# Patient Record
Sex: Male | Born: 1935 | Race: Black or African American | Hispanic: No | Smoking: Former smoker
Health system: Southern US, Community
[De-identification: ages and names within clinical notes are randomized; demographics above are authoritative.]

## PROBLEM LIST (undated history)

## (undated) DIAGNOSIS — R55 Syncope and collapse: Secondary | ICD-10-CM

## (undated) DIAGNOSIS — IMO0002 Reserved for concepts with insufficient information to code with codable children: Secondary | ICD-10-CM

## (undated) DIAGNOSIS — M1990 Unspecified osteoarthritis, unspecified site: Secondary | ICD-10-CM

## (undated) DIAGNOSIS — K219 Gastro-esophageal reflux disease without esophagitis: Secondary | ICD-10-CM

## (undated) DIAGNOSIS — Z8551 Personal history of malignant neoplasm of bladder: Secondary | ICD-10-CM

## (undated) DIAGNOSIS — I4729 Other ventricular tachycardia: Secondary | ICD-10-CM

## (undated) DIAGNOSIS — I493 Ventricular premature depolarization: Secondary | ICD-10-CM

## (undated) DIAGNOSIS — E785 Hyperlipidemia, unspecified: Secondary | ICD-10-CM

## (undated) DIAGNOSIS — I4891 Unspecified atrial fibrillation: Secondary | ICD-10-CM

## (undated) DIAGNOSIS — H5462 Unqualified visual loss, left eye, normal vision right eye: Secondary | ICD-10-CM

## (undated) DIAGNOSIS — F329 Major depressive disorder, single episode, unspecified: Secondary | ICD-10-CM

## (undated) DIAGNOSIS — I472 Ventricular tachycardia: Secondary | ICD-10-CM

## (undated) DIAGNOSIS — J449 Chronic obstructive pulmonary disease, unspecified: Secondary | ICD-10-CM

## (undated) DIAGNOSIS — N401 Enlarged prostate with lower urinary tract symptoms: Secondary | ICD-10-CM

## (undated) DIAGNOSIS — I2510 Atherosclerotic heart disease of native coronary artery without angina pectoris: Secondary | ICD-10-CM

## (undated) DIAGNOSIS — R0600 Dyspnea, unspecified: Secondary | ICD-10-CM

## (undated) DIAGNOSIS — I739 Peripheral vascular disease, unspecified: Secondary | ICD-10-CM

## (undated) DIAGNOSIS — R001 Bradycardia, unspecified: Secondary | ICD-10-CM

## (undated) DIAGNOSIS — Z86718 Personal history of other venous thrombosis and embolism: Secondary | ICD-10-CM

## (undated) DIAGNOSIS — H409 Unspecified glaucoma: Secondary | ICD-10-CM

## (undated) DIAGNOSIS — G8929 Other chronic pain: Secondary | ICD-10-CM

## (undated) DIAGNOSIS — F32A Depression, unspecified: Secondary | ICD-10-CM

## (undated) DIAGNOSIS — M545 Low back pain, unspecified: Secondary | ICD-10-CM

## (undated) DIAGNOSIS — F1010 Alcohol abuse, uncomplicated: Secondary | ICD-10-CM

## (undated) DIAGNOSIS — I5042 Chronic combined systolic (congestive) and diastolic (congestive) heart failure: Secondary | ICD-10-CM

## (undated) DIAGNOSIS — I10 Essential (primary) hypertension: Secondary | ICD-10-CM

## (undated) DIAGNOSIS — Z86711 Personal history of pulmonary embolism: Secondary | ICD-10-CM

## (undated) DIAGNOSIS — Z973 Presence of spectacles and contact lenses: Secondary | ICD-10-CM

## (undated) DIAGNOSIS — I447 Left bundle-branch block, unspecified: Secondary | ICD-10-CM

## (undated) DIAGNOSIS — I428 Other cardiomyopathies: Secondary | ICD-10-CM

## (undated) DIAGNOSIS — I724 Aneurysm of artery of lower extremity: Secondary | ICD-10-CM

## (undated) HISTORY — PX: SHOULDER SURGERY: SHX246

## (undated) HISTORY — DX: Hyperlipidemia, unspecified: E78.5

## (undated) HISTORY — PX: TONSILLECTOMY: SUR1361

## (undated) HISTORY — DX: Ventricular premature depolarization: I49.3

## (undated) HISTORY — DX: Peripheral vascular disease, unspecified: I73.9

## (undated) HISTORY — PX: CATARACT EXTRACTION W/ INTRAOCULAR LENS  IMPLANT, BILATERAL: SHX1307

## (undated) HISTORY — DX: Atherosclerotic heart disease of native coronary artery without angina pectoris: I25.10

## (undated) HISTORY — PX: TRANSTHORACIC ECHOCARDIOGRAM: SHX275

## (undated) HISTORY — PX: CARDIAC CATHETERIZATION: SHX172

## (undated) HISTORY — DX: Essential (primary) hypertension: I10

---

## 1998-12-11 ENCOUNTER — Encounter: Payer: Self-pay | Admitting: Specialist

## 1998-12-17 ENCOUNTER — Encounter: Payer: Self-pay | Admitting: Specialist

## 1998-12-17 ENCOUNTER — Inpatient Hospital Stay (HOSPITAL_COMMUNITY): Admission: RE | Admit: 1998-12-17 | Discharge: 1998-12-18 | Payer: Self-pay | Admitting: Specialist

## 1999-01-20 ENCOUNTER — Encounter: Admission: RE | Admit: 1999-01-20 | Discharge: 1999-02-12 | Payer: Self-pay | Admitting: Specialist

## 2004-05-05 ENCOUNTER — Ambulatory Visit: Payer: Self-pay | Admitting: Cardiology

## 2004-05-11 ENCOUNTER — Inpatient Hospital Stay (HOSPITAL_BASED_OUTPATIENT_CLINIC_OR_DEPARTMENT_OTHER): Admission: RE | Admit: 2004-05-11 | Discharge: 2004-05-11 | Payer: Self-pay | Admitting: Cardiology

## 2004-05-11 ENCOUNTER — Ambulatory Visit: Payer: Self-pay | Admitting: Cardiology

## 2004-05-22 ENCOUNTER — Ambulatory Visit: Payer: Self-pay

## 2004-11-06 ENCOUNTER — Ambulatory Visit: Payer: Self-pay | Admitting: Cardiology

## 2005-03-26 ENCOUNTER — Ambulatory Visit (HOSPITAL_COMMUNITY): Admission: RE | Admit: 2005-03-26 | Discharge: 2005-03-26 | Payer: Self-pay | Admitting: *Deleted

## 2005-10-06 ENCOUNTER — Encounter: Admission: RE | Admit: 2005-10-06 | Discharge: 2005-11-03 | Payer: Self-pay | Admitting: Family Medicine

## 2007-05-19 ENCOUNTER — Encounter: Admission: RE | Admit: 2007-05-19 | Discharge: 2007-05-19 | Payer: Self-pay | Admitting: Family Medicine

## 2007-05-25 ENCOUNTER — Ambulatory Visit (HOSPITAL_COMMUNITY): Admission: RE | Admit: 2007-05-25 | Discharge: 2007-05-25 | Payer: Self-pay | Admitting: Family Medicine

## 2007-06-02 ENCOUNTER — Encounter (INDEPENDENT_AMBULATORY_CARE_PROVIDER_SITE_OTHER): Payer: Self-pay | Admitting: Radiology

## 2007-06-02 ENCOUNTER — Ambulatory Visit (HOSPITAL_COMMUNITY): Admission: RE | Admit: 2007-06-02 | Discharge: 2007-06-02 | Payer: Self-pay | Admitting: Family Medicine

## 2007-07-11 ENCOUNTER — Ambulatory Visit: Payer: Self-pay | Admitting: Surgery

## 2007-07-12 ENCOUNTER — Ambulatory Visit: Payer: Self-pay | Admitting: Vascular Surgery

## 2010-02-02 ENCOUNTER — Encounter
Admission: RE | Admit: 2010-02-02 | Discharge: 2010-04-09 | Payer: Self-pay | Source: Home / Self Care | Attending: Family Medicine | Admitting: Family Medicine

## 2010-08-18 ENCOUNTER — Ambulatory Visit: Payer: Medicare Other | Attending: Orthopedic Surgery | Admitting: Physical Therapy

## 2010-08-18 DIAGNOSIS — M545 Low back pain, unspecified: Secondary | ICD-10-CM | POA: Insufficient documentation

## 2010-08-18 DIAGNOSIS — IMO0001 Reserved for inherently not codable concepts without codable children: Secondary | ICD-10-CM | POA: Insufficient documentation

## 2010-08-18 DIAGNOSIS — M6281 Muscle weakness (generalized): Secondary | ICD-10-CM | POA: Insufficient documentation

## 2010-08-21 ENCOUNTER — Encounter: Payer: BC Managed Care – PPO | Admitting: Physical Therapy

## 2010-08-25 NOTE — Consult Note (Signed)
NEW PATIENT CONSULTATION   Scott [name]  DOB:  1921-07-31                                       07/12/2007  EAVWU#:[id number]   The patient presents today for evaluation of left leg swelling and  absent pulses.  He is a healthy 77 year old black male who reports  several symptoms in his lower extremities.  He had noted some mild  swelling in his left foot and calf.  This is intermittent.  He does not  have any pain associated.  He does have numbness in his left great toe  and has been told by a podiatrist, this is related to bone deformity and  bunion.  He, on further questioning, does have calf claudication.  He  does not do a great deal of walking, but does report if he pushes  himself, he will have some tiredness and a feeling of giving out in his  calves, and have to rest for a few minutes.  This is not limiting to  him.  He denies any typical arterial rest pain and has not had any  tissue loss.  He does not have any history of deep vein thrombosis.   PAST MEDICAL HISTORY:  Negative for any cardiac disease.  He does not  have any diabetes.  He has hypertension.   SOCIAL HISTORY:  He does not smoke, having quit 35 years ago.   FAMILY HISTORY:  Negative for premature atherosclerotic disease.  He is  married with 1 child.  He is retired.  He does not drink alcohol.   REVIEW OF SYSTEMS:  Negative.   ALLERGIES:  None.   MEDICATIONS:  Niaspan.  Ditropan.  Potassium supplement.  Toprol.  Benicar.  TriCor.  Zocor.  Flomax.  Aspirin.  Viagra.   PHYSICAL EXAM:  The patient is a well-developed black male appearing  stated age of 45.  His blood pressure is 145/76, heart rate is 66,  respirations 18.  His carotid arteries have bruits bilaterally.  He is  grossly intact neurologically.  His radial and femoral pulses are 2+  bilaterally.  I do not palpate popliteal or distal pulses.  His heart is  regular rate and rhythm.  His chest is clear bilaterally.  His feet  show  no evidence of tissue loss and are well-perfused.  He underwent  noninvasive vascular laboratory studies in our office on 07/11/2007.  I  reviewed these with the patient and his wife.  His ankle/arm index is  0.75 on the right and 0.78 on the left with monophasic tibial wave  forms.  I explained that he does have evidence of superficial femoral or  popliteal occlusion.  He is mildly symptomatic from this.  I explained  this would not have anything to do with his leg swelling, and have not  recommended any further evaluation or treatment due to this.  I  explained that his foot numbness would not be related to arterial or  venous pathology and would not suspect any serious implications from  this.  He was reassured with this discussion and will see [place] again on an  as needed basis.   Larina Earthly, M.D.  Electronically Signed   TFE/MEDQ  D:  07/12/2007  T:  07/13/2007  Job:  [id number]   cc:   Ernestina Penna, M.D.

## 2010-08-25 NOTE — Procedures (Signed)
LOWER EXTREMITY ARTERIAL EVALUATION-SINGLE LEVEL   INDICATION:  Left toe numbness, which is intermittent.  Left calf edema.   HISTORY:  Diabetes:  No.  Cardiac:  No.  Hypertension:  Yes.  Smoking:  No, quit 35 years ago.  Previous Surgery:   RESTING SYSTOLIC PRESSURES: (ABI)                          RIGHT                LEFT  Brachial:               160                  152  Anterior tibial:        80                   100  Posterior tibial:       120 (0.75)           124 (0.78)  Peroneal:  DOPPLER WAVEFORM ANALYSIS:  Anterior tibial:        Monophasic           Monophasic  Posterior tibial:       Monophasic           Monophasic  Peroneal:  PREVIOUS ABI'S:  Date:  RIGHT:  LEFT:   IMPRESSION:  1. 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  2. Thrombosed left popliteal artery.  Patient is asymptomatic at this      time, and Dr. Edilia Bo felt it was all right for patient to return      tomorrow to see Dr. Fabienne Bruns.   ___________________________________________  Scott Hora Darrick Penna, MD   DP/MEDQ  D:  07/11/2007  T:  07/11/2007  Job:  [id number]

## 2010-08-26 ENCOUNTER — Ambulatory Visit: Payer: Medicare Other | Admitting: Physical Therapy

## 2010-08-28 ENCOUNTER — Ambulatory Visit: Payer: Medicare Other | Admitting: *Deleted

## 2010-08-28 NOTE — Cardiovascular Report (Signed)
NAMEROCKFORD, LEINEN NO.:  [id number]   MEDICAL RECORD NO.:  [id number]          PATIENT TYPE:  OIB   LOCATION:  6501                         FACILITY:  [facility]   PHYSICIAN:  Rollene Rotunda, M.D.   DATE OF BIRTH:  09-20-1921   DATE OF PROCEDURE:  05/11/2004  DATE OF DISCHARGE:                              CARDIAC CATHETERIZATION   PRIMARY CARE PHYSICIAN:  Ernestina Penna, M.D.   REASON FOR ADMISSION:  Evaluate patient with chest pain and a Cardiolite  suggesting possible inferior ischemia.  Also had an abnormal ejection  fraction of 52%.   PROCEDURE:  Left heart catheterization is performed via the right femoral  artery.  The artery was cannulated using anterior wall puncture.  A #4  French arterial sheath was inserted via the modified Seldinger technique.  Preformed Judkins and a pigtail catheter were utilized.  The patient  tolerated the procedure well and left the lab in stable condition.   RESULTS:  1.  Hemodynamics:  LV 139/8, AO 138/89.  2.  Coronaries:  The left main was normal.  The LAD had mid 25% stenosis.      The first diagonal was large and normal.  The second diagonal was      moderate size and normal.  The circumflex in the AV groove was normal.      There was mid obtuse marginal which was large and normal.  The right      coronary artery was a large dominant vessel and normal.  The PDA was      moderate size and normal.  3.  Left ventriculogram:  The left ventriculogram was obtained in the RAO      projection.  The EF was 55% with well-preserved ejection fraction.   CONCLUSION:  Minimal coronary plaque.  Normal left ventricular function.   PLAN:  No further cardiac workup is suggested.  The patient will continue to  follow with Dr. Christell Constant for primary risk reduction.      JH/MEDQ  D:  05/11/2004  T:  05/11/2004  Job:  [id number]   cc:   Ernestina Penna, M.D.  [address] [place]  [place] [address]  Fax: [phone]

## 2010-08-31 ENCOUNTER — Ambulatory Visit: Payer: Medicare Other | Admitting: Physical Therapy

## 2010-09-04 ENCOUNTER — Ambulatory Visit: Payer: Medicare Other | Admitting: *Deleted

## 2010-09-08 ENCOUNTER — Encounter: Payer: Self-pay | Admitting: Cardiology

## 2010-09-09 ENCOUNTER — Encounter: Payer: BC Managed Care – PPO | Admitting: Physical Therapy

## 2010-09-09 ENCOUNTER — Encounter: Payer: Self-pay | Admitting: Cardiology

## 2010-09-09 ENCOUNTER — Ambulatory Visit (INDEPENDENT_AMBULATORY_CARE_PROVIDER_SITE_OTHER): Payer: Medicare Other | Admitting: Cardiology

## 2010-09-09 DIAGNOSIS — E785 Hyperlipidemia, unspecified: Secondary | ICD-10-CM | POA: Insufficient documentation

## 2010-09-09 DIAGNOSIS — I493 Ventricular premature depolarization: Secondary | ICD-10-CM

## 2010-09-09 DIAGNOSIS — I10 Essential (primary) hypertension: Secondary | ICD-10-CM | POA: Insufficient documentation

## 2010-09-09 DIAGNOSIS — I4949 Other premature depolarization: Secondary | ICD-10-CM

## 2010-09-09 DIAGNOSIS — I2510 Atherosclerotic heart disease of native coronary artery without angina pectoris: Secondary | ICD-10-CM

## 2010-09-09 NOTE — Assessment & Plan Note (Signed)
He has frequent PVCs but he does not recall any symptoms.  I will schedule an echo and 24 hour Holter to further evaluate.

## 2010-09-09 NOTE — Patient Instructions (Signed)
You will be called with an appointment for a 2 D Echo and a 24 hour heart monitor.  There are no pre-procedure instructions for this testing. Please continue your current medications Follow up as needed

## 2010-09-09 NOTE — Assessment & Plan Note (Signed)
I reviewed his last lipids and they were very good for primary prevention.  No change in therapy is indicated.

## 2010-09-09 NOTE — Progress Notes (Signed)
HPI The patient presents for evaluation of frequent ventricular ectopy. He was noted recently on EKG to have very frequent ectopy with trigeminy. He actually does not feel this though there is apparently some confusion. However, he denies limitations and he denies any presyncope or syncope. I do note that he has had a fall. He does not report any chest pressure, neck or arm discomfort. He does not report any shortness of breath, PND or orthopnea. He does have some chronic lower extremity swelling on the left side and he has been evaluated for this.  I reviewed previous records and see if he had frequent PVCs at the time of hospitalization at [facility] in 2006.  I do not see a report of the echo that was ordered at that time.  He has had a cath around that time that apparently demonstrated only mild CAD.  No Known Allergies  Current Outpatient Prescriptions  Medication Sig Dispense Refill  . amLODipine (NORVASC) 5 MG tablet Take 5 mg by mouth daily.        . fenofibrate 160 MG tablet Take 160 mg by mouth daily.        . metoprolol (TOPROL-XL) 100 MG 24 hr tablet Take 100 mg by mouth. 1/2 po daily       . niacin (NIASPAN) 1000 MG CR tablet Take 1,000 mg by mouth at bedtime.        Marland Kitchen olmesartan-hydrochlorothiazide (BENICAR HCT) 20-12.5 MG per tablet Take 1 tablet by mouth daily.        Marland Kitchen oxybutynin (DITROPAN) 5 MG tablet Take 5 mg by mouth daily.        . simvastatin (ZOCOR) 10 MG tablet Take 10 mg by mouth at bedtime.        . Tamsulosin HCl (FLOMAX) 0.4 MG CAPS Take 0.4 mg by mouth daily.          Past Medical History  Diagnosis Date  . HTN (hypertension)   . Hyperlipidemia   . PVCs (premature ventricular contractions)   . Glaucoma   . CAD (coronary artery disease)     Non obstructive 25% LAD 2006  . CKD (chronic kidney disease)   . Sleep apnea     Doesn't use CPAP  . PVD (peripheral vascular disease)     Right ABI .75, Left .78 (2006)    Past Surgical History  Procedure Date  .  None     Family History  Problem Relation Age of Onset  . Hypertension Mother     History   Social History  . Marital Status: Married    Spouse Name: N/A    Number of Children: 1  . Years of Education: N/A   Occupational History  . Retired    Social History Main Topics  . Smoking status: Former Smoker    Quit date: 04/13/1975  . Smokeless tobacco: Not on file  . Alcohol Use: No  . Drug Use: Not on file  . Sexually Active: Not on file   Other Topics Concern  . Not on file   Social History Narrative  . No narrative on file    ROS:  As stated in the HPI and negative for all other systems.   PHYSICAL EXAM BP 112/68  Pulse 61  Resp 16  Ht 5\' 8"  (1.727 m)  Wt 165 lb (74.844 kg)  BMI 25.09 kg/m2 GENERAL:  Well appearing HEENT:  Pupils equal round and reactive, fundi not visualized, oral mucosa unremarkable, poor dentition NECK:  No  jugular venous distention, waveform within normal limits, carotid upstroke brisk and symmetric, no bruits, no thyromegaly LYMPHATICS:  No cervical, inguinal adenopathy LUNGS:  Clear to auscultation bilaterally BACK:  No CVA tenderness CHEST:  Unremarkable HEART:  PMI not displaced or sustained,S1 and S2 within normal limits, no S3, no S4, no clicks, no rubs, no murmurs ABD:  Flat, positive bowel sounds normal in frequency in pitch, no bruits, no rebound, no guarding, no midline pulsatile mass, no hepatomegaly, no splenomegaly EXT:  2 plus pulses upper and femorals.  Decreased DP/PT bilateral, left leg edema, no cyanosis no clubbing SKIN:  No rashes no nodules NEURO:  Cranial nerves II through XII grossly intact, motor grossly intact throughout PSYCH:  Cognitively intact, oriented to person place and time  EKG:  Sinus tachycardia, rate 114, interventricular conduction delay, left axis deviation, frequent and consecutive premature ventricular contractions (08/31/10)  ASSESSMENT AND PLAN

## 2010-09-09 NOTE — Assessment & Plan Note (Signed)
The blood pressure is at target. No change in medications is indicated. We will continue with therapeutic lifestyle changes (TLC).  

## 2010-09-11 ENCOUNTER — Encounter: Payer: Medicare Other | Admitting: Physical Therapy

## 2010-09-15 ENCOUNTER — Ambulatory Visit: Payer: Medicare Other | Attending: Orthopedic Surgery | Admitting: *Deleted

## 2010-09-15 DIAGNOSIS — M545 Low back pain, unspecified: Secondary | ICD-10-CM | POA: Insufficient documentation

## 2010-09-15 DIAGNOSIS — M6281 Muscle weakness (generalized): Secondary | ICD-10-CM | POA: Insufficient documentation

## 2010-09-15 DIAGNOSIS — IMO0001 Reserved for inherently not codable concepts without codable children: Secondary | ICD-10-CM | POA: Insufficient documentation

## 2010-09-17 ENCOUNTER — Ambulatory Visit: Payer: Medicare Other | Admitting: Physical Therapy

## 2010-09-17 ENCOUNTER — Encounter: Payer: Self-pay | Admitting: Cardiology

## 2010-09-21 ENCOUNTER — Ambulatory Visit: Payer: Medicare Other

## 2010-09-22 ENCOUNTER — Ambulatory Visit (HOSPITAL_COMMUNITY): Payer: Medicare Other | Attending: Cardiology

## 2010-09-22 ENCOUNTER — Encounter (INDEPENDENT_AMBULATORY_CARE_PROVIDER_SITE_OTHER): Payer: Medicare Other

## 2010-09-22 DIAGNOSIS — R002 Palpitations: Secondary | ICD-10-CM

## 2010-09-22 DIAGNOSIS — I079 Rheumatic tricuspid valve disease, unspecified: Secondary | ICD-10-CM | POA: Insufficient documentation

## 2010-09-22 DIAGNOSIS — I2510 Atherosclerotic heart disease of native coronary artery without angina pectoris: Secondary | ICD-10-CM

## 2010-09-22 DIAGNOSIS — R9431 Abnormal electrocardiogram [ECG] [EKG]: Secondary | ICD-10-CM

## 2010-09-22 DIAGNOSIS — I080 Rheumatic disorders of both mitral and aortic valves: Secondary | ICD-10-CM | POA: Insufficient documentation

## 2010-09-22 DIAGNOSIS — I493 Ventricular premature depolarization: Secondary | ICD-10-CM

## 2010-09-23 ENCOUNTER — Ambulatory Visit: Payer: Medicare Other | Admitting: Physical Therapy

## 2010-09-28 ENCOUNTER — Encounter: Payer: Medicare Other | Admitting: Physical Therapy

## 2010-09-30 ENCOUNTER — Telehealth: Payer: Self-pay | Admitting: Cardiology

## 2010-09-30 ENCOUNTER — Ambulatory Visit: Payer: Medicare Other | Admitting: Physical Therapy

## 2010-09-30 NOTE — Telephone Encounter (Signed)
Left message for Scott [name] of results of Echo but that I do not have the results of the monitor.  Dr Antoine Poche did request I schedule pt for a follow up appointment which I did for 6/27 at 3:15pm in [place].  Requested Scott [name] call back with questions or if he needs to reschedule appt.

## 2010-09-30 NOTE — Telephone Encounter (Signed)
PT SON WANTS TO KNOW RE PT TEST RESULTS.

## 2010-10-06 ENCOUNTER — Ambulatory Visit: Payer: Medicare Other | Admitting: Physical Therapy

## 2010-10-07 ENCOUNTER — Ambulatory Visit: Payer: Medicare Other | Admitting: Physical Therapy

## 2010-10-07 ENCOUNTER — Ambulatory Visit: Payer: Medicare Other | Admitting: Cardiology

## 2010-10-08 ENCOUNTER — Ambulatory Visit (INDEPENDENT_AMBULATORY_CARE_PROVIDER_SITE_OTHER): Payer: Medicare Other | Admitting: Cardiology

## 2010-10-08 ENCOUNTER — Encounter: Payer: Self-pay | Admitting: Cardiology

## 2010-10-08 VITALS — BP 148/77 | HR 78 | Ht 68.0 in | Wt 161.0 lb

## 2010-10-08 DIAGNOSIS — I10 Essential (primary) hypertension: Secondary | ICD-10-CM

## 2010-10-08 DIAGNOSIS — E785 Hyperlipidemia, unspecified: Secondary | ICD-10-CM

## 2010-10-08 DIAGNOSIS — I493 Ventricular premature depolarization: Secondary | ICD-10-CM

## 2010-10-08 DIAGNOSIS — I4949 Other premature depolarization: Secondary | ICD-10-CM

## 2010-10-08 DIAGNOSIS — R9431 Abnormal electrocardiogram [ECG] [EKG]: Secondary | ICD-10-CM

## 2010-10-08 NOTE — Progress Notes (Signed)
HPI The patient presents for evaluation of frequent ventricular ectopy. He had this noted on EKGs but he denies symptoms.  I sent him for a Holter which did demonstrate 14% PVCs with episodes of slight nonsustained VT.  He has had no presyncope or syncope. He denies any chest pressure, neck or arm discomfort. I did send him for an echo which demonstrated a preserved ejection fraction and no evidence of significant valvular abnormalities. He returns with his son today who is quite concerned about his father on with weight loss and confusion. He apparently is being referred for neurology evaluation.  No Known Allergies  Current Outpatient Prescriptions  Medication Sig Dispense Refill  . amLODipine (NORVASC) 5 MG tablet Take 5 mg by mouth daily.        Marland Kitchen aspirin 81 MG tablet Take 81 mg by mouth daily.        . fenofibrate 160 MG tablet Take 160 mg by mouth daily.        . metoprolol (TOPROL-XL) 100 MG 24 hr tablet Take 100 mg by mouth. 1/2 po daily       . niacin (NIASPAN) 1000 MG CR tablet Take 1,000 mg by mouth at bedtime.        Marland Kitchen olmesartan-hydrochlorothiazide (BENICAR HCT) 20-12.5 MG per tablet Take 1 tablet by mouth daily.        Marland Kitchen oxybutynin (DITROPAN) 5 MG tablet Take 5 mg by mouth daily.        . potassium chloride (KLOR-CON) 10 MEQ CR tablet Take 5 mEq by mouth daily.        . simvastatin (ZOCOR) 10 MG tablet Take 10 mg by mouth at bedtime.        . Tamsulosin HCl (FLOMAX) 0.4 MG CAPS Take 0.4 mg by mouth daily.          Past Medical History  Diagnosis Date  . HTN (hypertension)   . Hyperlipidemia   . PVCs (premature ventricular contractions)   . Glaucoma   . CAD (coronary artery disease)     Non obstructive 25% LAD 2006  . CKD (chronic kidney disease)   . Sleep apnea     Doesn't use CPAP  . PVD (peripheral vascular disease)     Right ABI .75, Left .78 (2006)    ROS:  As stated in the HPI and negative for all other systems.   PHYSICAL EXAM BP 148/77  Pulse 78  Ht 5\' 8"   (1.727 m)  Wt 161 lb (73.029 kg)  BMI 24.48 kg/m2 GENERAL:  Well appearing HEENT:  Pupils equal round and reactive, fundi not visualized, oral mucosa unremarkable, poor dentition NECK:  No jugular venous distention, waveform within normal limits, carotid upstroke brisk and symmetric, no bruits, no thyromegaly LYMPHATICS:  No cervical, inguinal adenopathy LUNGS:  Clear to auscultation bilaterally BACK:  No CVA tenderness CHEST:  Unremarkable HEART:  PMI not displaced or sustained,S1 and S2 within normal limits, no S3, no S4, no clicks, no rubs, no murmurs ABD:  Flat, positive bowel sounds normal in frequency in pitch, no bruits, no rebound, no guarding, no midline pulsatile mass, no hepatomegaly, no splenomegaly EXT:  2 plus pulses upper and femorals.  Decreased DP/PT bilateral, left leg edema, no cyanosis no clubbing SKIN:  No rashes no nodules NEURO:  Cranial nerves II through XII grossly intact, motor grossly intact throughout PSYCH:  Cognitively intact, oriented to person place and time  EKG:  Sinus tachycardia, rate 114, interventricular conduction delay, left axis deviation,  frequent and consecutive premature ventricular contractions (08/31/10)  ASSESSMENT AND PLAN

## 2010-10-08 NOTE — Patient Instructions (Signed)
You are being scheduled for a myoview stress test.  Please follow the instruction sheet given at the time of your appointment.  You will be called with the results. Continue current medications as listed. Follow up with Dr Antoine Poche as needed.

## 2010-10-08 NOTE — Assessment & Plan Note (Signed)
The patient does have frequent PVCs. He has nonsustained ventricular tachycardia and had nonobstructive coronary disease in 2006. At this point I will plan stress testing though he would not even exercise. If he has a normal perfusion study no further testing will be indicated for treatment as he is asymptomatic.

## 2010-10-08 NOTE — Assessment & Plan Note (Signed)
Although his blood pressure is slightly elevated I will not adjust his medications at this point. He can continue with blood pressure checks.

## 2010-10-08 NOTE — Assessment & Plan Note (Signed)
This is followed closely by Dr. Christell Constant.

## 2010-10-13 ENCOUNTER — Ambulatory Visit: Payer: Medicare Other | Attending: Orthopedic Surgery | Admitting: Physical Therapy

## 2010-10-13 DIAGNOSIS — IMO0001 Reserved for inherently not codable concepts without codable children: Secondary | ICD-10-CM | POA: Insufficient documentation

## 2010-10-13 DIAGNOSIS — M545 Low back pain, unspecified: Secondary | ICD-10-CM | POA: Insufficient documentation

## 2010-10-13 DIAGNOSIS — M6281 Muscle weakness (generalized): Secondary | ICD-10-CM | POA: Insufficient documentation

## 2010-10-15 ENCOUNTER — Ambulatory Visit (HOSPITAL_COMMUNITY): Payer: Medicare Other | Attending: Cardiology | Admitting: Radiology

## 2010-10-15 ENCOUNTER — Ambulatory Visit: Payer: Medicare Other | Admitting: *Deleted

## 2010-10-15 VITALS — Ht 68.0 in | Wt 160.0 lb

## 2010-10-15 DIAGNOSIS — I447 Left bundle-branch block, unspecified: Secondary | ICD-10-CM

## 2010-10-15 DIAGNOSIS — R9431 Abnormal electrocardiogram [ECG] [EKG]: Secondary | ICD-10-CM | POA: Insufficient documentation

## 2010-10-15 DIAGNOSIS — I4949 Other premature depolarization: Secondary | ICD-10-CM

## 2010-10-15 HISTORY — PX: CARDIOVASCULAR STRESS TEST: SHX262

## 2010-10-15 MED ORDER — TECHNETIUM TC 99M TETROFOSMIN IV KIT
33.0000 | PACK | Freq: Once | INTRAVENOUS | Status: AC | PRN
  Administered 2010-10-15: 33 via INTRAVENOUS

## 2010-10-15 MED ORDER — ADENOSINE (DIAGNOSTIC) 3 MG/ML IV SOLN
0.5600 mg/kg | Freq: Once | INTRAVENOUS | Status: AC
  Administered 2010-10-15: 40.8 mg via INTRAVENOUS

## 2010-10-15 MED ORDER — TECHNETIUM TC 99M TETROFOSMIN IV KIT
11.0000 | PACK | Freq: Once | INTRAVENOUS | Status: AC | PRN
  Administered 2010-10-15: 11 via INTRAVENOUS

## 2010-10-15 NOTE — Progress Notes (Addendum)
[facility] SITE 3 NUCLEAR MED [address] [place] [address] [phone]  Cardiology Nuclear Med Scott [name] is a 77 y.o. male [id number] 1921/06/09   Nuclear Med Background Indication for Stress Test:  Evaluation for Ischemia History: 06/12 Echo:EF 55% mod LVH, '06 Heart Catheterization: N/O CAD 25% LAD and '02 Myocardial Perfusion Study EF 44% (-) ischemia inf. thinning Cardiac Risk Factors: History of Smoking, Hypertension, Lipids and PVD  Symptoms:  Fatigue and Palpitations   Nuclear Pre-Procedure Caffeine/Decaff Intake:  None NPO After: 7:00pm   Lungs:  clear IV 0.9% NS with Angio Cath:  20g  IV Site: R Antecubital  IV Started by:  [name], EMT-P  Chest Size (in):  44 Cup Size: n/a  Height: 5\' 8"  (1.727 m)  Weight:  160 lb (72.576 kg)  BMI:  Body mass index is 24.33 kg/(m^2). Tech Comments:  All meds were taken today, per patient.    Nuclear Med Study 1 or 2 day study: 1 day  Stress Test Type:  Adenosine  Reading MD: Willa Rough, MD  Order Authorizing Provider:  J.Hochrein  Resting Radionuclide: Technetium 47m Tetrofosmin  Resting Radionuclide Dose: 11 mCi   Stress Radionuclide:  Technetium 52m Tetrofosmin  Stress Radionuclide Dose: 33 mCi           Stress Protocol Rest HR: 83 Stress HR: 83  Rest BP: 165/77 Stress BP: 167/65  Exercise Time (min): n/a METS: n/a   Predicted Max HR: 133 bpm % Max HR: 62.41 bpm Rate Pressure Product: 86578   Dose of Adenosine (mg):  40.7 Dose of Lexiscan: n/a mg  Dose of Atropine (mg): n/a Dose of Dobutamine: n/a mcg/kg/min (at max HR)  Stress Test Technologist: [name], EMT-P  Nuclear Technologist:  [name], CNMT     Rest Procedure:  Myocardial perfusion imaging was performed at rest 45 minutes following the intravenous administration of Technetium 48m Tetrofosmin. Rest ECG: NSR-LBBB  Stress Procedure:  The patient received IV adenosine at 140 mcg/kg/min for 4 minutes.   There were no significant changes and freq pvcs/vcuplets/triplet with infusion. During infusion the pt had a 5 consecutive beat wide complex beats with a varied morphology at approx. 100/minute. Technetium 14m Tetrofosmin was injected at the 2 minute mark and quantitative spect images were obtained after a 45 minute delay. Stress ECG: No significant change from baseline ECG  QPS Raw Data Images:  Normal; no motion artifact; normal heart/lung ratio. Stress Images:  Decrease activity in the inferior wall.(base,mid,apical) Rest Images:  Similar to stress image Subtraction (SDS):  No evidence of ischemia. Transient Ischemic Dilatation (Normal <1.22):  1.14 Lung/Heart Ratio (Normal <0.45):  .27  Quantitative Gated Spect Images QGS EDV:  n/a ml QGS ESV:  n/a ml QGS cine images:  Not gated due to PVCs. QGS EF: Study not gated  Impression Exercise Capacity:  Adenosine study with no exercise. BP Response:  Normal blood pressure response. Clinical Symptoms:  Light headed. ECG Impression:  No significant ST segment change suggestive of ischemia. Comparison with Prior Nuclear Study: No change  Overall Impression:  The study is similar to the study of 2002. However, the current study is not gated. There is evidence of old inferior scar. There is no definite ischemia.   Willa Rough   Unchanged low risk study without evidence for significant ischemia. EF was OK on echo.  No further work up or treatment of asymptomatic PVCs.  Rollene Rotunda

## 2010-10-16 NOTE — Progress Notes (Signed)
ROUTED TO DR. HOCHREIN.Falecha L Clark ° °

## 2010-10-20 ENCOUNTER — Telehealth: Payer: Self-pay | Admitting: *Deleted

## 2010-10-20 ENCOUNTER — Ambulatory Visit: Payer: Medicare Other | Admitting: *Deleted

## 2010-10-20 NOTE — Telephone Encounter (Signed)
Left message for pt to call back to discuss results of myoview.

## 2010-10-20 NOTE — Progress Notes (Signed)
Left message to call back to discuss results 

## 2010-10-22 ENCOUNTER — Ambulatory Visit: Payer: Medicare Other | Admitting: *Deleted

## 2010-10-27 ENCOUNTER — Ambulatory Visit: Payer: Medicare Other | Admitting: Physical Therapy

## 2010-10-29 ENCOUNTER — Ambulatory Visit: Payer: Medicare Other | Admitting: Physical Therapy

## 2010-10-29 ENCOUNTER — Encounter: Payer: Self-pay | Admitting: *Deleted

## 2010-10-29 NOTE — Progress Notes (Signed)
Letter mailed to pt of results

## 2010-10-29 NOTE — Telephone Encounter (Signed)
Left another message for pt to call for results.  Will mail a letter of the results to his home address.

## 2010-11-03 ENCOUNTER — Ambulatory Visit: Payer: Medicare Other | Admitting: *Deleted

## 2010-11-05 ENCOUNTER — Ambulatory Visit: Payer: Medicare Other | Admitting: Physical Therapy

## 2010-11-10 ENCOUNTER — Ambulatory Visit: Payer: Medicare Other | Attending: Orthopedic Surgery | Admitting: Physical Therapy

## 2010-11-10 DIAGNOSIS — M545 Low back pain, unspecified: Secondary | ICD-10-CM | POA: Insufficient documentation

## 2010-11-10 DIAGNOSIS — M6281 Muscle weakness (generalized): Secondary | ICD-10-CM | POA: Insufficient documentation

## 2010-11-10 DIAGNOSIS — IMO0001 Reserved for inherently not codable concepts without codable children: Secondary | ICD-10-CM | POA: Insufficient documentation

## 2010-11-12 ENCOUNTER — Encounter: Payer: Self-pay | Admitting: Cardiology

## 2010-11-12 ENCOUNTER — Ambulatory Visit: Payer: Medicare Other | Attending: Orthopedic Surgery | Admitting: *Deleted

## 2010-11-12 DIAGNOSIS — M6281 Muscle weakness (generalized): Secondary | ICD-10-CM | POA: Insufficient documentation

## 2010-11-12 DIAGNOSIS — M545 Low back pain, unspecified: Secondary | ICD-10-CM | POA: Insufficient documentation

## 2010-11-12 DIAGNOSIS — IMO0001 Reserved for inherently not codable concepts without codable children: Secondary | ICD-10-CM | POA: Insufficient documentation

## 2010-11-13 ENCOUNTER — Telehealth: Payer: Self-pay | Admitting: *Deleted

## 2010-11-13 ENCOUNTER — Encounter: Payer: Self-pay | Admitting: Cardiology

## 2010-11-13 NOTE — Telephone Encounter (Signed)
Left message on both home and moblie numbers.  Pt's potassium level is low at 3.2.  Need to verify how much potassium he is taking.  According to our chart he is only on 5 mEq a day.  Requested pt call back ASAP.

## 2010-11-13 NOTE — Telephone Encounter (Signed)
Left another message for pt.  He was given instruction to increase potassium to 20 MEQ daily and repeat his lab work next week.  Instructed pt that if [facility] has already called him about this he should follow their instructions.  Requested pt call back to let us know he received the message and to schedule follow up labs for next week.

## 2010-11-17 ENCOUNTER — Encounter: Payer: Medicare Other | Admitting: *Deleted

## 2010-11-19 ENCOUNTER — Encounter: Payer: Medicare Other | Admitting: Physical Therapy

## 2010-11-20 ENCOUNTER — Ambulatory Visit: Payer: Medicare Other | Admitting: *Deleted

## 2010-11-23 ENCOUNTER — Ambulatory Visit: Payer: Medicare Other | Admitting: Cardiology

## 2010-11-24 ENCOUNTER — Ambulatory Visit: Payer: Medicare Other | Admitting: Physical Therapy

## 2010-11-26 ENCOUNTER — Ambulatory Visit: Payer: Medicare Other | Admitting: Physical Therapy

## 2010-12-22 ENCOUNTER — Other Ambulatory Visit: Payer: Self-pay | Admitting: Family Medicine

## 2010-12-22 DIAGNOSIS — R634 Abnormal weight loss: Secondary | ICD-10-CM

## 2010-12-22 DIAGNOSIS — R194 Change in bowel habit: Secondary | ICD-10-CM

## 2010-12-22 DIAGNOSIS — R29898 Other symptoms and signs involving the musculoskeletal system: Secondary | ICD-10-CM

## 2010-12-24 ENCOUNTER — Other Ambulatory Visit: Payer: Medicare Other

## 2010-12-25 ENCOUNTER — Ambulatory Visit
Admission: RE | Admit: 2010-12-25 | Discharge: 2010-12-25 | Disposition: A | Discharge status: Home / Self Care | Payer: Medicare Other | Source: Ambulatory Visit | Attending: Family Medicine | Admitting: Family Medicine

## 2010-12-25 ENCOUNTER — Inpatient Hospital Stay: Admission: RE | Admit: 2010-12-25 | Payer: Medicare Other | Source: Ambulatory Visit

## 2010-12-25 ENCOUNTER — Inpatient Hospital Stay (HOSPITAL_COMMUNITY)
Admission: AD | Admit: 2010-12-25 | Discharge: 2010-12-29 | DRG: 300 | Disposition: A | Discharge status: Home / Self Care | Payer: Medicare Other | Source: Ambulatory Visit | Attending: Internal Medicine | Admitting: Internal Medicine

## 2010-12-25 ENCOUNTER — Other Ambulatory Visit: Payer: Self-pay | Admitting: Family Medicine

## 2010-12-25 ENCOUNTER — Other Ambulatory Visit: Payer: Medicare Other

## 2010-12-25 DIAGNOSIS — I70209 Unspecified atherosclerosis of native arteries of extremities, unspecified extremity: Secondary | ICD-10-CM | POA: Diagnosis present

## 2010-12-25 DIAGNOSIS — I7092 Chronic total occlusion of artery of the extremities: Secondary | ICD-10-CM | POA: Diagnosis present

## 2010-12-25 DIAGNOSIS — G9589 Other specified diseases of spinal cord: Secondary | ICD-10-CM | POA: Diagnosis present

## 2010-12-25 DIAGNOSIS — IMO0002 Reserved for concepts with insufficient information to code with codable children: Secondary | ICD-10-CM

## 2010-12-25 DIAGNOSIS — R194 Change in bowel habit: Secondary | ICD-10-CM

## 2010-12-25 DIAGNOSIS — I82819 Embolism and thrombosis of superficial veins of unspecified lower extremities: Secondary | ICD-10-CM | POA: Diagnosis present

## 2010-12-25 DIAGNOSIS — E785 Hyperlipidemia, unspecified: Secondary | ICD-10-CM | POA: Diagnosis present

## 2010-12-25 DIAGNOSIS — E876 Hypokalemia: Secondary | ICD-10-CM | POA: Diagnosis present

## 2010-12-25 DIAGNOSIS — I10 Essential (primary) hypertension: Secondary | ICD-10-CM | POA: Diagnosis present

## 2010-12-25 DIAGNOSIS — M7989 Other specified soft tissue disorders: Secondary | ICD-10-CM

## 2010-12-25 DIAGNOSIS — I724 Aneurysm of artery of lower extremity: Secondary | ICD-10-CM

## 2010-12-25 DIAGNOSIS — N400 Enlarged prostate without lower urinary tract symptoms: Secondary | ICD-10-CM | POA: Diagnosis present

## 2010-12-25 DIAGNOSIS — R634 Abnormal weight loss: Secondary | ICD-10-CM

## 2010-12-25 DIAGNOSIS — R29898 Other symptoms and signs involving the musculoskeletal system: Secondary | ICD-10-CM

## 2010-12-25 LAB — PROTIME-INR
INR: 1.23 (ref 0.00–1.49)
Prothrombin Time: 15.8 seconds — ABNORMAL HIGH (ref 11.6–15.2)

## 2010-12-25 MED ORDER — IOHEXOL 350 MG/ML SOLN
150.0000 mL | Freq: Once | INTRAVENOUS | Status: AC | PRN
  Administered 2010-12-25: 150 mL via INTRAVENOUS

## 2010-12-26 ENCOUNTER — Inpatient Hospital Stay (HOSPITAL_COMMUNITY): Payer: Medicare Other

## 2010-12-26 LAB — URINALYSIS, ROUTINE W REFLEX MICROSCOPIC
Bilirubin Urine: NEGATIVE
Glucose, UA: NEGATIVE mg/dL
Ketones, ur: NEGATIVE mg/dL
Nitrite: NEGATIVE
Protein, ur: NEGATIVE mg/dL
Specific Gravity, Urine: 1.015 (ref 1.005–1.030)
Urobilinogen, UA: 1 mg/dL (ref 0.0–1.0)
pH: 7.5 (ref 5.0–8.0)

## 2010-12-26 LAB — LIPID PANEL
Cholesterol: 77 mg/dL (ref 0–200)
HDL: 26 mg/dL — ABNORMAL LOW (ref >39)
LDL Cholesterol: 35 mg/dL (ref 0–99)
Total CHOL/HDL Ratio: 3 RATIO
Triglycerides: 81 mg/dL (ref <150)
VLDL: 16 mg/dL (ref 0–40)

## 2010-12-26 LAB — CBC
HCT: 29.8 % — ABNORMAL LOW (ref 39.0–52.0)
Hemoglobin: 10.8 g/dL — ABNORMAL LOW (ref 13.0–17.0)
MCH: 31.1 pg (ref 26.0–34.0)
MCHC: 36.2 g/dL — ABNORMAL HIGH (ref 30.0–36.0)
MCV: 85.9 fL (ref 78.0–100.0)
Platelets: 99 10*3/uL — ABNORMAL LOW (ref 150–400)
RBC: 3.47 MIL/uL — ABNORMAL LOW (ref 4.22–5.81)
RDW: 15 % (ref 11.5–15.5)
WBC: 2.7 10*3/uL — ABNORMAL LOW (ref 4.0–10.5)

## 2010-12-26 LAB — COMPREHENSIVE METABOLIC PANEL
ALT: 45 U/L (ref 0–53)
AST: 55 U/L — ABNORMAL HIGH (ref 0–37)
Albumin: 2.6 g/dL — ABNORMAL LOW (ref 3.5–5.2)
Alkaline Phosphatase: 55 U/L (ref 39–117)
BUN: 13 mg/dL (ref 6–23)
CO2: 30 mEq/L (ref 19–32)
Calcium: 9 mg/dL (ref 8.4–10.5)
Chloride: 104 mEq/L (ref 96–112)
Creatinine, Ser: 1 mg/dL (ref 0.50–1.35)
GFR calc Af Amer: >60 mL/min (ref >60)
GFR calc non Af Amer: >60 mL/min (ref >60)
Glucose, Bld: 85 mg/dL (ref 70–99)
Potassium: 3.1 mEq/L — ABNORMAL LOW (ref 3.5–5.1)
Sodium: 139 mEq/L (ref 135–145)
Total Bilirubin: 0.8 mg/dL (ref 0.3–1.2)
Total Protein: 4.9 g/dL — ABNORMAL LOW (ref 6.0–8.3)

## 2010-12-26 LAB — URINE MICROSCOPIC-ADD ON

## 2010-12-26 LAB — APTT: aPTT: 31 seconds (ref 24–37)

## 2010-12-26 LAB — TSH: TSH: 1.654 u[IU]/mL (ref 0.350–4.500)

## 2010-12-26 LAB — PSA: PSA: 1.49 ng/mL (ref <=4.00)

## 2010-12-26 LAB — HEMOGLOBIN A1C
Hgb A1c MFr Bld: 5.6 % (ref <5.7)
Mean Plasma Glucose: 114 mg/dL (ref <117)

## 2010-12-26 MED ORDER — IOHEXOL 300 MG/ML  SOLN
80.0000 mL | Freq: Once | INTRAMUSCULAR | Status: AC | PRN
  Administered 2010-12-26: 80 mL via INTRAVENOUS

## 2010-12-27 LAB — BASIC METABOLIC PANEL
BUN: 7 mg/dL (ref 6–23)
CO2: 28 mEq/L (ref 19–32)
Calcium: 8.8 mg/dL (ref 8.4–10.5)
Chloride: 105 mEq/L (ref 96–112)
Creatinine, Ser: 0.86 mg/dL (ref 0.50–1.35)
GFR calc Af Amer: >60 mL/min (ref >60)
GFR calc non Af Amer: >60 mL/min (ref >60)
Glucose, Bld: 88 mg/dL (ref 70–99)
Potassium: 3.8 mEq/L (ref 3.5–5.1)
Sodium: 138 mEq/L (ref 135–145)

## 2010-12-27 LAB — URINE CULTURE
Colony Count: NO GROWTH
Culture  Setup Time: 201209151116
Culture: NO GROWTH

## 2010-12-27 LAB — AFP TUMOR MARKER: AFP-Tumor Marker: 3 ng/mL (ref 0.0–8.0)

## 2010-12-27 LAB — MAGNESIUM: Magnesium: 1.2 mg/dL — ABNORMAL LOW (ref 1.5–2.5)

## 2010-12-28 ENCOUNTER — Inpatient Hospital Stay (HOSPITAL_COMMUNITY): Payer: Medicare Other

## 2010-12-28 ENCOUNTER — Inpatient Hospital Stay: Admission: RE | Admit: 2010-12-28 | Payer: Medicare Other | Source: Ambulatory Visit

## 2010-12-28 LAB — BASIC METABOLIC PANEL
BUN: 6 mg/dL (ref 6–23)
CO2: 27 mEq/L (ref 19–32)
Calcium: 9.1 mg/dL (ref 8.4–10.5)
Chloride: 107 mEq/L (ref 96–112)
Creatinine, Ser: 0.82 mg/dL (ref 0.50–1.35)
GFR calc Af Amer: >60 mL/min (ref >60)
GFR calc non Af Amer: >60 mL/min (ref >60)
Glucose, Bld: 86 mg/dL (ref 70–99)
Potassium: 3.4 mEq/L — ABNORMAL LOW (ref 3.5–5.1)
Sodium: 140 mEq/L (ref 135–145)

## 2010-12-28 LAB — CBC
HCT: 29 % — ABNORMAL LOW (ref 39.0–52.0)
Hemoglobin: 10.5 g/dL — ABNORMAL LOW (ref 13.0–17.0)
MCH: 31.5 pg (ref 26.0–34.0)
MCHC: 36.2 g/dL — ABNORMAL HIGH (ref 30.0–36.0)
MCV: 87.1 fL (ref 78.0–100.0)
Platelets: 113 10*3/uL — ABNORMAL LOW (ref 150–400)
RBC: 3.33 MIL/uL — ABNORMAL LOW (ref 4.22–5.81)
RDW: 15.3 % (ref 11.5–15.5)
WBC: 3.6 10*3/uL — ABNORMAL LOW (ref 4.0–10.5)

## 2010-12-28 MED ORDER — TECHNETIUM TC 99M MEDRONATE IV KIT
25.0000 | PACK | Freq: Once | INTRAVENOUS | Status: AC | PRN
  Administered 2010-12-28: 25 via INTRAVENOUS

## 2010-12-31 ENCOUNTER — Encounter: Payer: Medicare Other | Admitting: Oncology

## 2011-01-01 LAB — CBC
HCT: 36.8 — ABNORMAL LOW
Hemoglobin: 12.4 — ABNORMAL LOW
MCHC: 33.6
MCV: 86.4
Platelets: 238
RBC: 4.26
RDW: 14.7
WBC: 3.7 — ABNORMAL LOW

## 2011-01-01 LAB — PROTIME-INR
INR: 1
Prothrombin Time: 13.6

## 2011-01-01 LAB — APTT: aPTT: 45 — ABNORMAL HIGH

## 2011-01-05 NOTE — Discharge Summary (Signed)
[name], [name] NO.:  [id number]  MEDICAL RECORD NO.:  [id number]  LOCATION:  5158                         FACILITY:  [facility]  PHYSICIAN:  Erick Blinks, MD     DATE OF BIRTH:  04/14/1921  DATE OF ADMISSION:  12/25/2010 DATE OF DISCHARGE:  12/29/2010                              DISCHARGE SUMMARY   PRIMARY CARE PHYSICIAN:  Ernestina Penna, MD  VASCULAR SURGEON: 1. Durene Cal IV, MD  DISCHARGE DIAGNOSES: 1. Bilateral popliteal aneurysms, chronic, for outpatient followup. 2. Bony lesions in lumbar spine with question of metastatic disease,     for further outpatient workup. 3. 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 4. Benign prostatic hypertrophy, on Flomax. 5. Hypertension. 6. Hypokalemia. 7. Hyperlipidemia. 8. Chronic lower extremity edema, with Avelox.  DISCHARGE MEDICATIONS: 1. Naproxen over-the-counter p.o. b.i.d. as needed. 2. Meloxicam 7.5 mg p.o. daily. 3. Norvasc 5 mg p.o. daily. 4. Flomax 0.4 mg p.o. in the morning. 5. Zocor 10 mg p.o. daily. 6. TriCor 145 mg p.o. daily. 7. Benicar/hydrochlorothiazide 20/12.5 mg p.o. at bedtime. 8. Klor-Con 10 mEq p.o. daily. 9. Toprol-XL 50 mg p.o. daily. 10.Niaspan 500 mg p.o. daily. 11.Lumigan 0.01% to right eye 1 drop at bedtime.  ADMISSION HISTORY:  This is an 77 year old gentleman who was brought to the emergency room with complaints of left leg swelling.  The patient has been having lower extremity edema for many years and he has been followed by the Vascular Surgery Service.  The patient's swelling had continued to progress and he started to experience numbness in his left great toe.  He was also stumbling while he was walking.  He had a CT scan done to evaluate the vascular structures and it was noted that he had bilateral popliteal aneurysms and it was also noted that he had possible metastatic disease in his lumbar vertebra.  He was subsequently referred for admission.   For details, please refer the history and physical per Dr. Arthor Captain on admission.  HOSPITAL COURSE: 1. Popliteal aneurysms.  The patient was followed by Dr. Myra Gianotti here     in the hospital.  Records were reviewed and it was felt that the     patient's aneurysms have been thrombosed for many years now, and that any     acute surgical intervention was not necessary at this time.  He was prescribed TED     hose stockings for his lower extremity edema and it was requested     to follow up with the Vascular Surgery Service in 3-4 weeks. 2. Possible metastatic disease.  The patient did have possible bony     metastatic lesions in the lumbar spine.  The remainder of his CT of     the abdomen did not show any suspicious lesions. The CT scan was     reviewed with radiologist and it was noted that he did have some     hypervascular lesions in his liver, which were likely hemangiomas,     also a benign-appearing nodule in his bladder.  The patient also     had a CT scan of the chest done, which did not show  any suspicious     primary malignancy.  He subsequently underwent a bone scan, which     did show an uptake in his lumbar spine, but did not show any     further suspicious lesions in the remainder of his bony structures.     The patient has had a chronic T12 lesion, which it was evident on     an MRI in 2009, which was felt to be possibly Paget disease.     Further workup including MRI of the lumbar spine inorder to compare to     old MRIs was offered as well as an inpatient Oncology consult, but     the patient is insisting on being discharged home today to have     further workup done as an outpatient.  The patient appears to be     clinically stable.  His bony lesions do need further workup.  Of     note, his PSA was checked and was found to be in normal range.     Also alpha-fetoprotein was checked and was also found to be in     normal range, but again these bony lesions do need further  workup.     I [facility] to have a followup     appointment as an outpatient at the next available appointment.  He     will be contacted by their office to further workup to these bony     lesions whether that be with an MRI or further biopsy as needed. 3. The remainder of the patient's medical issues have remained stable.     He was evaluated by Physical Therapy and has done fairly well with     them as well.  No home health physical therapy was recommended.  I have also updated the patient's family including his daughter-in-law who is Dr. Laural Benes in [place].  DISCHARGE INSTRUCTIONS:  The patient is to continue on a heart-healthy diet.  Conduct his activity as tolerated.  Follow up with his primary care physician in the next 1-2 weeks.  He will need to follow up with Vascular Surgery Service in 3-4 weeks and he will be called with an appointment from the [facility] for further followup.     Erick Blinks, MD     JM/MEDQ  D:  12/29/2010  T:  12/29/2010  Job:  [id number]  cc:   Ernestina Penna, M.D. Jorge Ny, MD  Electronically Signed by Erick Blinks  on 01/05/2011 08:41:19 PM

## 2011-01-09 NOTE — Consult Note (Signed)
Scott [name], CHALMERS NO.:  [id number]  MEDICAL RECORD NO.:  [id number]  LOCATION:  5158                         FACILITY:  [facility]  PHYSICIAN:  Juleen China IV, MDDATE OF BIRTH:  1922-02-23  DATE OF CONSULTATION:  12/25/2010 DATE OF DISCHARGE:                                CONSULTATION   REASON FOR CONSULTATION:  Popliteal aneurysm.  CONSULTING PHYSICIAN:  Dr. Christell Constant, [facility].  ADMISSION REASON/CHIEF COMPLAINT:  Left leg swelling.  HISTORY:  Mr. Wernette is an 77 year old gentleman who has been having issues with the swelling for several years.  He was seen in our office by Dr. Arbie Cookey in 2009 for arterial insufficiency.  At that time, he had an ultrasound which showed an ankle-arm index of 0.75 on the right and 0.78 on the left with monophasic waveforms.  He was only mildly symptomatic and therefore no further treatment was recommended.  He now states that the swelling in his left leg has gotten worse.  He also complains of swelling and numbness in the left great toe.  He is having some instability with his walking and has fallen.  He has had a history of a footdrop on the left which improve with therapy, however, he now complains of footdrop on the right.  He does not have significant complaints of claudication.  He did undergo ultrasound today which shows popliteal aneurysm.  These were causing mass effect on the popliteal vein with a partial occlusion of the left popliteal vein.  The patient did undergo CT angiogram which shows bone metastases.  There was also thrombosed popliteal aneurysm bilaterally.  There is liver lesion and bladder nodule.  The patient suffers from hypertension and hyperlipidemia which have been managed medically.  He is fairly active, but is not able to walk very far.  He denies wounds in either extremity.  He denies rest pain.  REVIEW OF SYSTEMS:  As above.  No changes from admission history and physical  review of systems on December 25, 2010.  PAST MEDICAL HISTORY: 1. Peripheral vascular disease. 2. Hypertension. 3. Hyperlipidemia. 4. Benign prostatic hypertrophy. 5. Questionable fatty liver disease.  SOCIAL HISTORY:  No current tobacco use.  Minimal alcohol.  FAMILY HISTORY:  Negative for premature cardiovascular disease.  PHYSICAL EXAMINATION:  VITAL SIGNS:  Temperature 98.8, respirations 20, heart rate is 78, sinus, blood pressure 129/72, and O2 sats 100% on room air. GENERAL:  He is well appearing, in no acute distress. HEENT:  Within normal limits. LUNGS:  Respirations are nonlabored. CARDIOVASCULAR:  Regular rate and rhythm.  Pedal pulses are not palpable. ABDOMEN:  Soft and nontender. EXTREMITIES:  Bilateral edema.  No ulceration. NEUROLOGICAL:  He is intact. SKIN:  Without rash.  ASSESSMENT/PLAN:  Bilateral popliteal aneurysms with leg swelling, left greater than right.  The patient's aneurysms have been thrombosed for quite some time as he likely had these findings on ultrasound when he saw [place] in the office several years ago.  I have reviewed his CT scan. His vascular disease appears chronic.  I do not see any thrombus within the popliteal vein that was suggested by ultrasound, however, this may be difficult to  detect on CT scan because of the contrast timing.  No acute intervention needs to be done on his lower extremity arterial disease.  His popliteal aneurysms have thrombosed and again this appears to be chronic.  There is thrombus within the popliteal vein and this does not appear to be acute.  There is likely secondary to mass effect from the popliteal aneurysm.  In light of the patient's findings on CT scan which are concerning for metastatic malignancy with unknown primary, I think this should be the primary focus of his hospitalization.  I do not think there are any acute changes or findings with his lower extremity disease.  We will continue to follow  this, however, I think further evaluation of his malignancy should be the primary focus.     Jorge Ny, MD     VWB/MEDQ  D:  12/27/2010  T:  12/27/2010  Job:  [id number]  Electronically Signed by Arelia Longest IV MD on 01/09/2011 09:58:20 AM

## 2011-01-13 ENCOUNTER — Other Ambulatory Visit: Payer: Self-pay | Admitting: Oncology

## 2011-01-13 ENCOUNTER — Encounter (HOSPITAL_BASED_OUTPATIENT_CLINIC_OR_DEPARTMENT_OTHER): Payer: Medicare Other | Admitting: Oncology

## 2011-01-13 DIAGNOSIS — M7989 Other specified soft tissue disorders: Secondary | ICD-10-CM

## 2011-01-13 DIAGNOSIS — C419 Malignant neoplasm of bone and articular cartilage, unspecified: Secondary | ICD-10-CM

## 2011-01-13 DIAGNOSIS — M899 Disorder of bone, unspecified: Secondary | ICD-10-CM

## 2011-01-13 DIAGNOSIS — R948 Abnormal results of function studies of other organs and systems: Secondary | ICD-10-CM

## 2011-01-13 LAB — CBC WITH DIFFERENTIAL/PLATELET
BASO%: 0.6 % (ref 0.0–2.0)
Basophils Absolute: 0 10*3/uL (ref 0.0–0.1)
EOS%: 1 % (ref 0.0–7.0)
Eosinophils Absolute: 0 10*3/uL (ref 0.0–0.5)
HCT: 33.7 % — ABNORMAL LOW (ref 38.4–49.9)
HGB: 11.5 g/dL — ABNORMAL LOW (ref 13.0–17.1)
LYMPH%: 13.8 % — ABNORMAL LOW (ref 14.0–49.0)
MCH: 32.2 pg (ref 27.2–33.4)
MCHC: 34.1 g/dL (ref 32.0–36.0)
MCV: 94.3 fL (ref 79.3–98.0)
MONO#: 0.4 10*3/uL (ref 0.1–0.9)
MONO%: 11.5 % (ref 0.0–14.0)
NEUT#: 2.6 10*3/uL (ref 1.5–6.5)
NEUT%: 73.1 % (ref 39.0–75.0)
Platelets: 207 10*3/uL (ref 140–400)
RBC: 3.57 10*6/uL — ABNORMAL LOW (ref 4.20–5.82)
RDW: 17.4 % — ABNORMAL HIGH (ref 11.0–14.6)
WBC: 3.5 10*3/uL — ABNORMAL LOW (ref 4.0–10.3)
lymph#: 0.5 10*3/uL — ABNORMAL LOW (ref 0.9–3.3)

## 2011-01-15 LAB — SPEP & IFE WITH QIG
Albumin ELP: 60 % (ref 55.8–66.1)
Alpha-1-Globulin: 4.3 % (ref 2.9–4.9)
Alpha-2-Globulin: 9.2 % (ref 7.1–11.8)
Beta 2: 4.3 % (ref 3.2–6.5)
Beta Globulin: 5.7 % (ref 4.7–7.2)
Gamma Globulin: 16.5 % (ref 11.1–18.8)
IgA: 143 mg/dL (ref 68–379)
IgG (Immunoglobin G), Serum: 1010 mg/dL (ref 650–1600)
IgM, Serum: 181 mg/dL (ref 41–251)
Total Protein, Serum Electrophoresis: 6.2 g/dL (ref 6.0–8.3)

## 2011-01-15 LAB — COMPREHENSIVE METABOLIC PANEL
ALT: 16 U/L (ref 0–53)
AST: 23 U/L (ref 0–37)
Albumin: 3.9 g/dL (ref 3.5–5.2)
Alkaline Phosphatase: 52 U/L (ref 39–117)
BUN: 28 mg/dL — ABNORMAL HIGH (ref 6–23)
CO2: 29 mEq/L (ref 19–32)
Calcium: 9.6 mg/dL (ref 8.4–10.5)
Chloride: 104 mEq/L (ref 96–112)
Creatinine, Ser: 1.12 mg/dL (ref 0.50–1.35)
Glucose, Bld: 95 mg/dL (ref 70–99)
Potassium: 3.8 mEq/L (ref 3.5–5.3)
Sodium: 141 mEq/L (ref 135–145)
Total Bilirubin: 0.7 mg/dL (ref 0.3–1.2)
Total Protein: 6.2 g/dL (ref 6.0–8.3)

## 2011-01-20 ENCOUNTER — Ambulatory Visit (HOSPITAL_COMMUNITY)
Admission: RE | Admit: 2011-01-20 | Discharge: 2011-01-20 | Disposition: A | Discharge status: Home / Self Care | Payer: Medicare Other | Source: Ambulatory Visit | Attending: Oncology | Admitting: Oncology

## 2011-01-20 DIAGNOSIS — M7130 Other bursal cyst, unspecified site: Secondary | ICD-10-CM | POA: Insufficient documentation

## 2011-01-20 DIAGNOSIS — M5137 Other intervertebral disc degeneration, lumbosacral region: Secondary | ICD-10-CM | POA: Insufficient documentation

## 2011-01-20 DIAGNOSIS — M899 Disorder of bone, unspecified: Secondary | ICD-10-CM

## 2011-01-20 DIAGNOSIS — R948 Abnormal results of function studies of other organs and systems: Secondary | ICD-10-CM | POA: Insufficient documentation

## 2011-01-20 DIAGNOSIS — M4120 Other idiopathic scoliosis, site unspecified: Secondary | ICD-10-CM | POA: Insufficient documentation

## 2011-01-20 DIAGNOSIS — M47817 Spondylosis without myelopathy or radiculopathy, lumbosacral region: Secondary | ICD-10-CM | POA: Insufficient documentation

## 2011-01-20 DIAGNOSIS — M51379 Other intervertebral disc degeneration, lumbosacral region without mention of lumbar back pain or lower extremity pain: Secondary | ICD-10-CM | POA: Insufficient documentation

## 2011-01-20 DIAGNOSIS — C61 Malignant neoplasm of prostate: Secondary | ICD-10-CM | POA: Insufficient documentation

## 2011-01-20 DIAGNOSIS — Q762 Congenital spondylolisthesis: Secondary | ICD-10-CM | POA: Insufficient documentation

## 2011-01-20 MED ORDER — GADOBENATE DIMEGLUMINE 529 MG/ML IV SOLN
15.0000 mL | Freq: Once | INTRAVENOUS | Status: AC | PRN
  Administered 2011-01-20: 15 mL via INTRAVENOUS

## 2011-01-25 NOTE — H&P (Signed)
Scott [name], Scott [name] NO.:  [id number]  MEDICAL RECORD NO.:  [id number]  LOCATION:                                 FACILITY:  PHYSICIAN:  Scott Llano, MD       DATE OF BIRTH:  Jul 09, 1921  DATE OF ADMISSION: DATE OF DISCHARGE:                             HISTORY & PHYSICAL   PRIMARY CARE PHYSICIAN:  Scott Penna, MD, with a [facility].  REASON FOR ADMISSION:  Left lower extremity swelling.  Scott [name] is an 77 year old African American male with past medical history of hypertension, hyperlipidemia.  The patient came into the hospital complaining about left lower extremity swelling.  The patient has known peripheral vascular disease also.  His symptoms started about 2-3 years ago as far as he can remember with minor swelling of his left lower extremity which has progressed.  The patient started to have other symptoms as his swelling progressed with numbness of the great left great toe.  The patient also starts to stumble while he is walking.  The patient denies pain and denies burning in his extremities.  Also, he noticed the same symptoms in the right lower extremity but to lesser extent.  The patient said he does not walk a lot, so he did not notice any change while he is walking.  The patient denies any shortness of breast, chest pain, denies diabetes.  For the past 2 months, his symptoms have progressed to the point that bothered him.  The patient went back to his primary care physician.  This morning, he did have a CT angiography of the aorta and iliofemoral runoff.  The patient was called to come back to the hospital after his calves showed severe occlusive disease, peripheral arterial disease with aneurysm involving the left superficial femoral vein, left popliteal artery, and occlusion of the left popliteal artery associated with 2.5 popliteal artery aneurysm and occlusion of the distal right superficial femoral artery with very  poor runoff of the right lower extremity.  Also, CT scan showed abnormal appearance of lumbar spine and concerned for malignancy.  There is a sclerosis and lytic bony lesion in T12 concerning for malignancy.  The patient is admitted to the hospital for further medical evaluation.  PAST MEDICAL HISTORY: 1. Peripheral vascular disease. 2. Hypertension. 3. Hyperlipidemia. 4. Benign prostatic hypertrophy. 5. Question of fatty liver disease.  MEDICATIONS: 1. Niacin 50 mg p.o. daily. 2. Ditropan XL 5 mg p.o. daily. 3. Klor-Con 10 mEq p.o. daily. 4. Toprol-XL 100 mg p.o. 1/2 tablet in the morning. 5. Benicar HCT 20/25 one-half tablet twice a day. 6. TriCor 140 mg p.o. daily. 7. Flomax 4.5 mg p.o. daily. 8. Fenofibrate 160 mg p.o. daily. 9. Amlodipine 5 mg p.o. daily. 10.Meloxicam 7.5 mg p.o. as needed for pain.  REVIEW OF SYSTEMS:  GENERAL:  The patient denies fevers, chills, sweats. HEENT:  Denies headache, nasal discharge.  SKIN:  Denies rash, lesions. CARDIAC:  Denies chest pain, palpitation.  PULMONARY:  Denies shortness of breath, wheezing.  GENITOURINARY:  Denies any urgency, but having troubles initiating his urine stream.  NEUROLOGIC:  Denies weakness, numbness, or disturbance.  MUSCULOSKELETAL:  Has problems with walking secondary to numbness in his legs.  GASTROINTESTINAL:  Denies nausea, vomiting, diarrhea.  ENDOCRINE:  Denies polyuria, polydipsia.  PHYSICAL EXAMINATION:  VITAL SIGNS:  Temperature is 98.8, pulse is 78, respirations 20, blood pressure is 129/72, O2 sats 100% on room air. GENERAL:  The patient is a well-developed African American elderly male, sitting in chair without acute distress. HEENT:  Head:  Normocephalic, atraumatic.  ENT without abnormality. Mouth:  No oral thrush or lesions. NECK:  No meningeal signs.  No JVD appreciated. LUNGS:  Clear bilaterally. CARDIOVASCULAR:  Regular rate and rhythm.  No murmurs, rubs, or gallops. ABDOMEN:  Bowel sounds  heard.  Soft, nontender, distended. GENITOURINARY:  No CVA tenderness. SPINE:  I looked at the lumbar spine, there is no point tenderness. EXTREMITIES:  Lower extremities, there is +1 pitting edema in the left lower extremity.  The left foot is cold, I cannot feel pulse in both the lower extremities.  I cannot feel good pulse in left lower extremity. The patient mentioned some numbness on both great toes right more than left, but he still has sensation over there. NEUROLOGIC:  Alert, awake, oriented x3.  No abnormal motor sensation except in the right great toe.  LABORATORY DATA:  From December 22, 2010, from outpatient lab: 1. CBC:  White blood count 3.6, hemoglobin 12.9, hematocrit 37.5,     platelets 145. 2. BMP:  Sodium 135, potassium 3.4, chloride 97, bicarb is 27, glucose     101, BUN is 21, creatinine is 1.34, calcium 9.5. 3. LFTs:  Total bili is 1.9, alkaline phosphatase is 76, AST 141, ALT     69, albumin is 3.4. 4. TSH is 1.11.  CT angio of the aorta, at the aortofemoral runoff, there is: 1. Severe occlusive peripheral arterial disease, aneurysm involving     the left superficial femoral artery and left popliteal artery.     There is occlusion of the left popliteal artery associated with 2.5-     cm popliteal artery aneurysm.  There is severe runoff disease in     the left lower extremity. 2. Occlusion of the distal right superficial femoral artery with very     poor runoff in the right lower extremity. 3. Abnormal appearance of lumbar spine vertebral bodies.  Findings are     highly concerning for metastatic bone disease.  The primary site is     unknown but consider the prostate. 4. Intermediate nodule along the right posterior wall of the urinary     bladder and neoplasm in this area cannot be excluded. 5. Multiple hyperdense likely hypervascular structures within the     liver.  These findings are nonspecific that could represent     perfusion abnormality but  metastatic disease cannot be excluded     based on other findings.  ASSESSMENT AND PLAN: 1. Peripheral vascular disease.  As mentioned above in the CT     angiogram, it did show severe bilateral lower extremity vascular     disease.  We will check fasting lipid profile and maximize the     effect of dyslipidemia.  These symptoms are chronic with acute     exacerbation with the swelling and the inability to walk properly.     Dr. Myra Gianotti from Vascular Surgery was consulted for further     evaluation.  I discussed with them.  There is a small thrombus     inside the left popliteal vein artery aneurysm, but the thrombus  does not appear to be occlusive.  No need for anticoagulation for     now. 2. Abnormal appearance of lumbar spine.  It appears there is sclerotic     appearance and concerning lucency involving T12 vertebral body.     This can be bony metastasis disease.  The patient does not mention     any back pain.  PSA will be checked.  The patient also has     increased LFTs and concerning liver findings.  LFTs will be checked     and if the PSA is not that high, likely consultation from     Hematology/Oncology will be obtained.  If the PSA is high,     consultation from Urology might be appropriate for further     evaluation. 3. Abnormal LFTs.  This is to be determined by the level of the PSA.     The plan is as mentioned in the above assessment. 4. Hypertension.  We will restart home medications. 5. Hyperlipidemia.  Check fasting lipid profile. 6. A bladder lesion found on CT scan.  The nodule in the right     posterior wall of the urinary bladder, neoplasm cannot be excluded.     I will check a UA and likely consult of Urology will be warranted     in the morning.     Scott Llano, MD     ME/MEDQ  D:  12/25/2010  T:  12/25/2010  Job:  [id number]  cc:   Scott [name], M.D. Jorge Ny, MD  Electronically Signed by Scott [name]  on 01/25/2011 01:48:34 PM

## 2011-04-11 DIAGNOSIS — Z8701 Personal history of pneumonia (recurrent): Secondary | ICD-10-CM | POA: Diagnosis not present

## 2011-04-11 DIAGNOSIS — I509 Heart failure, unspecified: Secondary | ICD-10-CM | POA: Diagnosis not present

## 2011-04-11 DIAGNOSIS — R0602 Shortness of breath: Secondary | ICD-10-CM | POA: Diagnosis not present

## 2011-04-11 DIAGNOSIS — M6281 Muscle weakness (generalized): Secondary | ICD-10-CM | POA: Diagnosis not present

## 2011-04-11 DIAGNOSIS — I10 Essential (primary) hypertension: Secondary | ICD-10-CM | POA: Diagnosis not present

## 2011-04-14 DIAGNOSIS — I509 Heart failure, unspecified: Secondary | ICD-10-CM | POA: Diagnosis not present

## 2011-04-14 DIAGNOSIS — I10 Essential (primary) hypertension: Secondary | ICD-10-CM | POA: Diagnosis not present

## 2011-04-14 DIAGNOSIS — Z8701 Personal history of pneumonia (recurrent): Secondary | ICD-10-CM | POA: Diagnosis not present

## 2011-04-14 DIAGNOSIS — R0602 Shortness of breath: Secondary | ICD-10-CM | POA: Diagnosis not present

## 2011-04-14 DIAGNOSIS — M6281 Muscle weakness (generalized): Secondary | ICD-10-CM | POA: Diagnosis not present

## 2011-04-15 DIAGNOSIS — R0602 Shortness of breath: Secondary | ICD-10-CM | POA: Diagnosis not present

## 2011-04-15 DIAGNOSIS — M6281 Muscle weakness (generalized): Secondary | ICD-10-CM | POA: Diagnosis not present

## 2011-04-15 DIAGNOSIS — Z8701 Personal history of pneumonia (recurrent): Secondary | ICD-10-CM | POA: Diagnosis not present

## 2011-04-15 DIAGNOSIS — I10 Essential (primary) hypertension: Secondary | ICD-10-CM | POA: Diagnosis not present

## 2011-04-15 DIAGNOSIS — I509 Heart failure, unspecified: Secondary | ICD-10-CM | POA: Diagnosis not present

## 2011-04-16 DIAGNOSIS — R0602 Shortness of breath: Secondary | ICD-10-CM | POA: Diagnosis not present

## 2011-04-16 DIAGNOSIS — I10 Essential (primary) hypertension: Secondary | ICD-10-CM | POA: Diagnosis not present

## 2011-04-16 DIAGNOSIS — Z8701 Personal history of pneumonia (recurrent): Secondary | ICD-10-CM | POA: Diagnosis not present

## 2011-04-16 DIAGNOSIS — M6281 Muscle weakness (generalized): Secondary | ICD-10-CM | POA: Diagnosis not present

## 2011-04-16 DIAGNOSIS — I509 Heart failure, unspecified: Secondary | ICD-10-CM | POA: Diagnosis not present

## 2011-04-21 DIAGNOSIS — M6281 Muscle weakness (generalized): Secondary | ICD-10-CM | POA: Diagnosis not present

## 2011-04-21 DIAGNOSIS — Z8701 Personal history of pneumonia (recurrent): Secondary | ICD-10-CM | POA: Diagnosis not present

## 2011-04-21 DIAGNOSIS — I10 Essential (primary) hypertension: Secondary | ICD-10-CM | POA: Diagnosis not present

## 2011-04-21 DIAGNOSIS — I509 Heart failure, unspecified: Secondary | ICD-10-CM | POA: Diagnosis not present

## 2011-04-21 DIAGNOSIS — R0602 Shortness of breath: Secondary | ICD-10-CM | POA: Diagnosis not present

## 2011-04-27 DIAGNOSIS — M25539 Pain in unspecified wrist: Secondary | ICD-10-CM | POA: Diagnosis not present

## 2011-04-27 DIAGNOSIS — M069 Rheumatoid arthritis, unspecified: Secondary | ICD-10-CM | POA: Diagnosis not present

## 2011-04-27 DIAGNOSIS — M2550 Pain in unspecified joint: Secondary | ICD-10-CM | POA: Diagnosis not present

## 2011-04-27 DIAGNOSIS — I10 Essential (primary) hypertension: Secondary | ICD-10-CM | POA: Diagnosis not present

## 2011-04-29 DIAGNOSIS — I10 Essential (primary) hypertension: Secondary | ICD-10-CM | POA: Diagnosis not present

## 2011-04-29 DIAGNOSIS — R0602 Shortness of breath: Secondary | ICD-10-CM | POA: Diagnosis not present

## 2011-04-29 DIAGNOSIS — M6281 Muscle weakness (generalized): Secondary | ICD-10-CM | POA: Diagnosis not present

## 2011-04-29 DIAGNOSIS — Z8701 Personal history of pneumonia (recurrent): Secondary | ICD-10-CM | POA: Diagnosis not present

## 2011-04-29 DIAGNOSIS — I509 Heart failure, unspecified: Secondary | ICD-10-CM | POA: Diagnosis not present

## 2011-05-05 DIAGNOSIS — I509 Heart failure, unspecified: Secondary | ICD-10-CM | POA: Diagnosis not present

## 2011-05-05 DIAGNOSIS — I10 Essential (primary) hypertension: Secondary | ICD-10-CM | POA: Diagnosis not present

## 2011-05-05 DIAGNOSIS — R0602 Shortness of breath: Secondary | ICD-10-CM | POA: Diagnosis not present

## 2011-05-05 DIAGNOSIS — Z8701 Personal history of pneumonia (recurrent): Secondary | ICD-10-CM | POA: Diagnosis not present

## 2011-05-05 DIAGNOSIS — M6281 Muscle weakness (generalized): Secondary | ICD-10-CM | POA: Diagnosis not present

## 2011-05-12 DIAGNOSIS — M6281 Muscle weakness (generalized): Secondary | ICD-10-CM | POA: Diagnosis not present

## 2011-05-12 DIAGNOSIS — Z8701 Personal history of pneumonia (recurrent): Secondary | ICD-10-CM | POA: Diagnosis not present

## 2011-05-12 DIAGNOSIS — N390 Urinary tract infection, site not specified: Secondary | ICD-10-CM | POA: Diagnosis not present

## 2011-05-12 DIAGNOSIS — I509 Heart failure, unspecified: Secondary | ICD-10-CM | POA: Diagnosis not present

## 2011-05-12 DIAGNOSIS — I10 Essential (primary) hypertension: Secondary | ICD-10-CM | POA: Diagnosis not present

## 2011-05-12 DIAGNOSIS — R0602 Shortness of breath: Secondary | ICD-10-CM | POA: Diagnosis not present

## 2011-05-13 DIAGNOSIS — I509 Heart failure, unspecified: Secondary | ICD-10-CM | POA: Diagnosis not present

## 2011-05-13 DIAGNOSIS — R0602 Shortness of breath: Secondary | ICD-10-CM | POA: Diagnosis not present

## 2011-05-27 DIAGNOSIS — I10 Essential (primary) hypertension: Secondary | ICD-10-CM | POA: Diagnosis not present

## 2011-05-27 DIAGNOSIS — J189 Pneumonia, unspecified organism: Secondary | ICD-10-CM | POA: Diagnosis not present

## 2011-07-19 DIAGNOSIS — R634 Abnormal weight loss: Secondary | ICD-10-CM | POA: Diagnosis not present

## 2011-07-19 DIAGNOSIS — E559 Vitamin D deficiency, unspecified: Secondary | ICD-10-CM | POA: Diagnosis not present

## 2011-07-19 DIAGNOSIS — I10 Essential (primary) hypertension: Secondary | ICD-10-CM | POA: Diagnosis not present

## 2011-07-19 DIAGNOSIS — J189 Pneumonia, unspecified organism: Secondary | ICD-10-CM | POA: Diagnosis not present

## 2011-07-19 DIAGNOSIS — E785 Hyperlipidemia, unspecified: Secondary | ICD-10-CM | POA: Diagnosis not present

## 2011-07-19 DIAGNOSIS — M2550 Pain in unspecified joint: Secondary | ICD-10-CM | POA: Diagnosis not present

## 2011-07-27 DIAGNOSIS — R52 Pain, unspecified: Secondary | ICD-10-CM | POA: Diagnosis not present

## 2011-07-27 DIAGNOSIS — R109 Unspecified abdominal pain: Secondary | ICD-10-CM | POA: Diagnosis not present

## 2011-07-27 DIAGNOSIS — R634 Abnormal weight loss: Secondary | ICD-10-CM | POA: Diagnosis not present

## 2011-09-20 DIAGNOSIS — N329 Bladder disorder, unspecified: Secondary | ICD-10-CM | POA: Diagnosis not present

## 2011-09-21 ENCOUNTER — Other Ambulatory Visit: Payer: Self-pay | Admitting: Urology

## 2011-09-30 ENCOUNTER — Encounter (HOSPITAL_BASED_OUTPATIENT_CLINIC_OR_DEPARTMENT_OTHER): Payer: Self-pay | Admitting: *Deleted

## 2011-10-01 ENCOUNTER — Encounter (HOSPITAL_BASED_OUTPATIENT_CLINIC_OR_DEPARTMENT_OTHER): Payer: Self-pay | Admitting: *Deleted

## 2011-10-01 NOTE — Progress Notes (Signed)
NPO AFTER MN. ARRIVES AT 1100. NEEDS ISTAT AND EKG. WILL TAKE TOPROL AM OF SURG W/ SIP OF WATER.

## 2011-10-07 ENCOUNTER — Encounter (HOSPITAL_BASED_OUTPATIENT_CLINIC_OR_DEPARTMENT_OTHER): Payer: Self-pay | Admitting: Anesthesiology

## 2011-10-07 ENCOUNTER — Encounter (HOSPITAL_BASED_OUTPATIENT_CLINIC_OR_DEPARTMENT_OTHER)
Admission: RE | Disposition: A | Discharge status: Home / Self Care | Payer: Self-pay | Source: Ambulatory Visit | Attending: Urology

## 2011-10-07 ENCOUNTER — Other Ambulatory Visit: Payer: Self-pay

## 2011-10-07 ENCOUNTER — Encounter (HOSPITAL_BASED_OUTPATIENT_CLINIC_OR_DEPARTMENT_OTHER): Payer: Self-pay | Admitting: *Deleted

## 2011-10-07 ENCOUNTER — Ambulatory Visit (HOSPITAL_BASED_OUTPATIENT_CLINIC_OR_DEPARTMENT_OTHER)
Admission: RE | Admit: 2011-10-07 | Discharge: 2011-10-07 | Disposition: A | Discharge status: Home / Self Care | Payer: Medicare Other | Source: Ambulatory Visit | Attending: Urology | Admitting: Urology

## 2011-10-07 ENCOUNTER — Ambulatory Visit (HOSPITAL_BASED_OUTPATIENT_CLINIC_OR_DEPARTMENT_OTHER): Payer: Medicare Other | Admitting: Anesthesiology

## 2011-10-07 DIAGNOSIS — C672 Malignant neoplasm of lateral wall of bladder: Secondary | ICD-10-CM | POA: Insufficient documentation

## 2011-10-07 DIAGNOSIS — D090 Carcinoma in situ of bladder: Secondary | ICD-10-CM | POA: Diagnosis not present

## 2011-10-07 DIAGNOSIS — Z79899 Other long term (current) drug therapy: Secondary | ICD-10-CM | POA: Diagnosis not present

## 2011-10-07 DIAGNOSIS — C670 Malignant neoplasm of trigone of bladder: Secondary | ICD-10-CM | POA: Diagnosis not present

## 2011-10-07 DIAGNOSIS — H409 Unspecified glaucoma: Secondary | ICD-10-CM | POA: Insufficient documentation

## 2011-10-07 DIAGNOSIS — E7800 Pure hypercholesterolemia, unspecified: Secondary | ICD-10-CM | POA: Diagnosis not present

## 2011-10-07 DIAGNOSIS — M109 Gout, unspecified: Secondary | ICD-10-CM | POA: Diagnosis not present

## 2011-10-07 DIAGNOSIS — D494 Neoplasm of unspecified behavior of bladder: Secondary | ICD-10-CM | POA: Diagnosis not present

## 2011-10-07 DIAGNOSIS — I10 Essential (primary) hypertension: Secondary | ICD-10-CM | POA: Diagnosis not present

## 2011-10-07 HISTORY — PX: TRANSURETHRAL RESECTION OF BLADDER TUMOR: SHX2575

## 2011-10-07 HISTORY — DX: Reserved for concepts with insufficient information to code with codable children: IMO0002

## 2011-10-07 HISTORY — DX: Aneurysm of artery of lower extremity: I72.4

## 2011-10-07 HISTORY — DX: Unspecified osteoarthritis, unspecified site: M19.90

## 2011-10-07 HISTORY — PX: CYSTOSCOPY: SHX5120

## 2011-10-07 LAB — POCT I-STAT, CHEM 8
BUN: 16 mg/dL (ref 6–23)
Calcium, Ion: 1.37 mmol/L — ABNORMAL HIGH (ref 1.12–1.32)
Chloride: 104 mEq/L (ref 96–112)
Creatinine, Ser: 1.1 mg/dL (ref 0.50–1.35)
Glucose, Bld: 101 mg/dL — ABNORMAL HIGH (ref 70–99)
HCT: 41 % (ref 39.0–52.0)
Hemoglobin: 13.9 g/dL (ref 13.0–17.0)
Potassium: 3.6 mEq/L (ref 3.5–5.1)
Sodium: 142 mEq/L (ref 135–145)
TCO2: 23 mmol/L (ref 0–100)

## 2011-10-07 SURGERY — TURBT (TRANSURETHRAL RESECTION OF BLADDER TUMOR)
Anesthesia: General | Site: Bladder | Wound class: Clean Contaminated

## 2011-10-07 MED ORDER — LIDOCAINE HCL (CARDIAC) 20 MG/ML IV SOLN
INTRAVENOUS | Status: DC | PRN
  Administered 2011-10-07: 60 mg via INTRAVENOUS

## 2011-10-07 MED ORDER — SODIUM CHLORIDE 0.9 % IV SOLN: 250.0000 mL | INTRAVENOUS | Status: DC | PRN

## 2011-10-07 MED ORDER — MIDAZOLAM HCL 5 MG/5ML IJ SOLN
INTRAMUSCULAR | Status: DC | PRN
  Administered 2011-10-07: 1 mg via INTRAVENOUS

## 2011-10-07 MED ORDER — TRAMADOL HCL 50 MG PO TABS: 50.0000 mg | ORAL_TABLET | Freq: Three times a day (TID) | ORAL | Status: AC | PRN

## 2011-10-07 MED ORDER — FENTANYL CITRATE 0.05 MG/ML IJ SOLN
INTRAMUSCULAR | Status: DC | PRN
  Administered 2011-10-07: 25 ug via INTRAVENOUS
  Administered 2011-10-07: 50 ug via INTRAVENOUS

## 2011-10-07 MED ORDER — ACETAMINOPHEN 650 MG RE SUPP: 650.0000 mg | RECTAL | Status: DC | PRN

## 2011-10-07 MED ORDER — CIPROFLOXACIN IN D5W 400 MG/200ML IV SOLN
400.0000 mg | INTRAVENOUS | Status: AC
  Administered 2011-10-07: 400 mg via INTRAVENOUS

## 2011-10-07 MED ORDER — ONDANSETRON HCL 4 MG/2ML IJ SOLN: 4.0000 mg | Freq: Four times a day (QID) | INTRAMUSCULAR | Status: DC | PRN

## 2011-10-07 MED ORDER — LACTATED RINGERS IV SOLN
INTRAVENOUS | Status: DC
  Administered 2011-10-07: 12:00:00 via INTRAVENOUS

## 2011-10-07 MED ORDER — STERILE WATER FOR IRRIGATION IR SOLN
Status: DC | PRN
  Administered 2011-10-07: 500 mL

## 2011-10-07 MED ORDER — ACETAMINOPHEN 325 MG PO TABS: 650.0000 mg | ORAL_TABLET | ORAL | Status: DC | PRN

## 2011-10-07 MED ORDER — ONDANSETRON HCL 4 MG/2ML IJ SOLN
INTRAMUSCULAR | Status: DC | PRN
  Administered 2011-10-07: 4 mg via INTRAVENOUS

## 2011-10-07 MED ORDER — FENTANYL CITRATE 0.05 MG/ML IJ SOLN: 25.0000 ug | INTRAMUSCULAR | Status: DC | PRN

## 2011-10-07 MED ORDER — SODIUM CHLORIDE 0.9 % IJ SOLN: 3.0000 mL | INTRAMUSCULAR | Status: DC | PRN

## 2011-10-07 MED ORDER — SODIUM CHLORIDE 0.9 % IJ SOLN: 3.0000 mL | Freq: Two times a day (BID) | INTRAMUSCULAR | Status: DC

## 2011-10-07 MED ORDER — EPHEDRINE SULFATE 50 MG/ML IJ SOLN
INTRAMUSCULAR | Status: DC | PRN
  Administered 2011-10-07: 10 mg via INTRAVENOUS

## 2011-10-07 MED ORDER — PROPOFOL 10 MG/ML IV EMUL
INTRAVENOUS | Status: DC | PRN
  Administered 2011-10-07: 150 mg via INTRAVENOUS

## 2011-10-07 MED ORDER — OXYCODONE HCL 5 MG PO TABS: 5.0000 mg | ORAL_TABLET | ORAL | Status: DC | PRN

## 2011-10-07 MED ORDER — SODIUM CHLORIDE 0.9 % IR SOLN
Status: DC | PRN
  Administered 2011-10-07: 6000 mL

## 2011-10-07 SURGICAL SUPPLY — 29 items
BAG DRAIN URO-CYSTO SKYTR STRL (DRAIN) ×2 IMPLANT
BAG DRN ANRFLXCHMBR STRAP LEK (BAG) ×1
BAG DRN UROCATH (DRAIN) ×1
BAG URINE DRAINAGE (UROLOGICAL SUPPLIES) IMPLANT
BAG URINE LEG 19OZ MD ST LTX (BAG) ×2 IMPLANT
CANISTER SUCT LVC 12 LTR MEDI- (MISCELLANEOUS) ×2 IMPLANT
CATH FOLEY 2WAY SLVR  5CC 20FR (CATHETERS)
CATH FOLEY 2WAY SLVR  5CC 22FR (CATHETERS) ×1
CATH FOLEY 2WAY SLVR 5CC 20FR (CATHETERS) IMPLANT
CATH FOLEY 2WAY SLVR 5CC 22FR (CATHETERS) ×1 IMPLANT
CLOTH BEACON ORANGE TIMEOUT ST (SAFETY) ×2 IMPLANT
DRAPE CAMERA CLOSED 9X96 (DRAPES) ×2 IMPLANT
ELECT LOOP HF 26F 30D .35MM (CUTTING LOOP) IMPLANT
ELECT NEEDLE 45D HF 24-28F 12D (CUTTING LOOP) IMPLANT
ELECT REM PT RETURN 9FT ADLT (ELECTROSURGICAL)
ELECTRODE REM PT RTRN 9FT ADLT (ELECTROSURGICAL) IMPLANT
GLOVE BIOGEL PI IND STRL 6.5 (GLOVE) ×1 IMPLANT
GLOVE BIOGEL PI INDICATOR 6.5 (GLOVE) ×1
GLOVE ECLIPSE 6.5 STRL STRAW (GLOVE) ×2 IMPLANT
GLOVE SURG SS PI 8.0 STRL IVOR (GLOVE) ×2 IMPLANT
GOWN STRL REIN XL XLG (GOWN DISPOSABLE) ×2 IMPLANT
GOWN SURGICAL LARGE (GOWNS) ×2 IMPLANT
HOLDER FOLEY CATH W/STRAP (MISCELLANEOUS) IMPLANT
KIT ASPIRATION TUBING (SET/KITS/TRAYS/PACK) IMPLANT
LOOP CUTTING 24FR OLYMPUS (CUTTING LOOP) ×2 IMPLANT
PACK CYSTOSCOPY (CUSTOM PROCEDURE TRAY) ×2 IMPLANT
PLUG CATH AND CAP STER (CATHETERS) IMPLANT
SET ASPIRATION TUBING (TUBING) IMPLANT
SYRINGE IRR TOOMEY STRL 70CC (SYRINGE) IMPLANT

## 2011-10-07 NOTE — Transfer of Care (Signed)
Immediate Anesthesia Transfer of Care Note  Patient: Scott [name]  Procedure(s) Performed: Procedure(s) (LRB): TRANSURETHRAL RESECTION OF BLADDER TUMOR (TURBT) (N/A) CYSTOSCOPY (N/A)  Patient Location: PACU  Anesthesia Type: General  Level of Consciousness: awake, oriented, sedated and patient cooperative  Airway & Oxygen Therapy: Patient Spontanous Breathing and Patient connected to face mask oxygen  Post-op Assessment: Report given to PACU RN and Post -op Vital signs reviewed and stable  Post vital signs: Reviewed and stable  Complications: No apparent anesthesia complications

## 2011-10-07 NOTE — H&P (Signed)
Problems  1. 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 Christell Constant for a right bladder nodule seen on a recent CT angio for iliac disease.   He has no prior history of bladder cancer.  I had seen him in 2005 for BPH with BOO and irritative symptoms.  He has had no gross hematuria.  He has nocturia 1-2x and has some urgency with only rare UUI.  He has a reduced but adequate stream.  He remains on tamsulosin and oxybutynin for his voiding symptoms.   Past Medical History Problems  1. History of  Arthritis V13.4 2. History of  Glaucoma 365.9 3. History of  Gout 274.9 4. History of  Hypercholesterolemia 272.0 5. History of  Hypertension 401.9  Surgical History Problems  1. History of  No Surgical Problems  Current Meds 1. AmLODIPine Besylate 5 MG Oral Tablet; Therapy: 31May2013 to 2. Benicar HCT 20-12.5 MG Oral Tablet; Therapy: 14May2012 to 3. Fenofibrate 160 MG Oral Tablet; Therapy: 14May2012 to 4. Klor-Con 10 10 MEQ Oral Tablet Extended Release; Therapy: 01Aug2012 to 5. Metoprolol Succinate ER 100 MG Oral Tablet Extended Release 24 Hour; Therapy: 31May2013 to 6. Niaspan 1000 MG Oral Tablet Extended Release; Therapy: 14May2012 to 7. Oxybutynin Chloride 5 MG Oral Tablet; Therapy: 31May2013 to 8. Simvastatin 10 MG Oral Tablet; Therapy: 01Aug2012 to 9. Tamsulosin HCl 0.4 MG Oral Capsule; Therapy: 06Mar2012 to  Allergies Medication  1. No Known Drug Allergies  Family History Problems  1. Family history of  Death In The Family Father 2. Family history of  Death In The Family Mother deceased age 34 36. Family history of  Family Health Status Number Of Children 1 son  Social History Problems  1. Caffeine Use 1 per day 2. Former Smoker V15.82 smoked 1/2 for 76yrs quit 48yrs ago 3. Marital History - Currently Married Denied  4. History of  Alcohol Use  Review of Systems Genitourinary,  constitutional, skin, eye, otolaryngeal, hematologic/lymphatic, cardiovascular, pulmonary, endocrine, musculoskeletal, gastrointestinal, neurological and psychiatric system(s) were reviewed and pertinent findings if present are noted.  Genitourinary: feelings of urinary urgency, nocturia, weak urinary stream and erectile dysfunction.  Gastrointestinal: diarrhea.  Constitutional: recent weight loss.  Respiratory: shortness of breath and cough.    Vitals Vital Signs [Data Includes: Last 1 Day]  10Jun2013 12:44PM  BMI Calculated: 25.76 BSA Calculated: 1.91 Height: 5 ft 8 in Weight: 170 lb  Blood Pressure: 169 / 87 Temperature: 98.9 F Heart Rate: 82  Physical Exam Constitutional: Well nourished and well developed . No acute distress.  ENT:. The ears and nose are normal in appearance.  Neck: The appearance of the neck is normal and no neck mass is present.  Pulmonary: No respiratory distress and normal respiratory rhythm and effort.  Cardiovascular: Heart rate and rhythm are normal . No peripheral edema.  Abdomen: The abdomen is soft and nontender. No masses are palpated. No CVA tenderness. No hernias are palpable. No hepatosplenomegaly noted.  Genitourinary: Examination of the penis demonstrates no discharge, no masses, no lesions and a normal meatus. The scrotum is without lesions. The right epididymis is palpably normal and non-tender. The left epididymis is palpably normal and non-tender. The right testis is non-tender and without masses. The left testis is non-tender and without masses.  Lymphatics: The supraclavicular and axillary nodes are not enlarged or tender.  Skin: Normal skin turgor, no visible rash and no visible skin  lesions.  Neuro/Psych:. Mood and affect are appropriate.    Results/Data Urine [Data Includes: Last 1 Day]   10Jun2013  COLOR YELLOW   APPEARANCE CLEAR   SPECIFIC GRAVITY 1.010   pH 5.5   GLUCOSE NEG mg/dL  BILIRUBIN NEG   KETONE NEG mg/dL  BLOOD SMALL     PROTEIN NEG mg/dL  UROBILINOGEN 0.2 mg/dL  NITRITE NEG   LEUKOCYTE ESTERASE TRACE   SQUAMOUS EPITHELIAL/HPF NONE SEEN   WBC 0-3 WBC/hpf  RBC 0-3 RBC/hpf  BACTERIA NONE SEEN   CRYSTALS NONE SEEN   CASTS NONE SEEN    Old records or history reviewed: I reviewed his recent hospital records and CT report.    Procedure  Procedure: Cystoscopy   Indication: History of Urothelial Carcinoma.  Antibiotic prophylaxis: Ciprofloxacin.  Procedure Note:  Urethral meatus:. No abnormalities.  Anterior urethra: No abnormalities.  Prostatic urethra: No abnormalities . Estimated length was 3 cm. There was visual obstruction of the prostatic urethra. The lateral and median prostatic lobes were enlarged. An enlarged intravesical median lobe was visualized.  Bladder: The ureteral orifices were in the normal anatomic position bilaterally. The mucosa was smooth without abnormalities. Examination of the bladder demonstrated mild trabeculation. A solitary tumor was visualized in the bladder. A papillary tumor was seen in the bladder measuring approximately 1.5 cm in size. This tumor was located on the posterior aspect of the bladder.    Assessment Assessed  1. 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   Plan Bladder Neoplasm Of Uncertain Behavior (236.7)  1. Cysto  Done: 10Jun2013 2. Follow-up Schedule Surgery Office  Follow-up  Requested for: 10Jun2013 Health Maintenance (V70.0)  3. UA With REFLEX  Done: 10Jun2013 12:11PM   I am going to get him set up for TURBT.   The tumor appears low grade and with his age, I am going to forgo Mitomycin C. I have reviewed the risks of bleeding, infection, bladder wall injury, tumor recurrence, thrombotic events, strictures and anesthetic complications.

## 2011-10-07 NOTE — Anesthesia Procedure Notes (Signed)
Procedure Name: LMA Insertion Date/Time: 10/07/2011 12:14 PM Performed by: Renella Cunas D Pre-anesthesia Checklist: Patient identified, Emergency Drugs available, Suction available and Patient being monitored Patient Re-evaluated:Patient Re-evaluated prior to inductionOxygen Delivery Method: Circle System Utilized Preoxygenation: Pre-oxygenation with 100% oxygen Intubation Type: IV induction Ventilation: Mask ventilation without difficulty LMA: LMA inserted LMA Size: 5.0 Number of attempts: 2 Airway Equipment and Method: bite block Placement Confirmation: positive ETCO2 Tube secured with: Tape Dental Injury: Teeth and Oropharynx as per pre-operative assessment

## 2011-10-07 NOTE — Interval H&P Note (Signed)
History and Physical Interval Note:  10/07/2011 11:26 AM  Scott [name]  has presented today for surgery, with the diagnosis of Bladder Tumor  The various methods of treatment have been discussed with the patient and family. After consideration of risks, benefits and other options for treatment, the patient has consented to  Procedure(s) (LRB): TRANSURETHRAL RESECTION OF BLADDER TUMOR (TURBT) (N/A) as a surgical intervention .  The patient's history has been reviewed, patient examined, no change in status, stable for surgery.  I have reviewed the patients' chart and labs.  Questions were answered to the patient's satisfaction.     Kavin Weckwerth J

## 2011-10-07 NOTE — Discharge Instructions (Addendum)
Cystoscopy (Bladder Exam) A cystoscopy is an examination of your urinary bladder with a cystoscope. A cystoscope is an instrument like a small telescope with strong lights and lenses. It is inserted into the bladder through the urethra (the opening into the bladder) and allows your caregiver to examine the inside of your bladder. The procedure causes little discomfort and can be done in a hospital or office. It is a diagnostic procedure to evaluate the inside of your bladder. It may involve x-rays to further evaluate the ureters or internal aspects of the kidneys. It may aid in the removal of urinary stones  or in taking tissue samples (biopsies) if necessary. The procedure is easier in females because of a shorter urethra. In a male, the procedure must be done through the penis. This often requires more sedation and more time to do the procedure. The procedure usually takes twenty minutes to one half hour for a male and approximately an hour for a male. LET YOUR CAREGIVERS KNOW ABOUT:  Allergies.   Medications taken including herbs, eye drops, over the counter medications, and creams.   Use of steroids (by mouth or creams).   Previous problems with anesthetics or novocaine.   Possibility of pregnancy, if this applies.   History of blood clots (thrombophlebitis).   History of bleeding or blood problems.   Previous surgery, especially where prosthetics have been used like hip or knee replacements, and heart valve replacements.   Other health problems.  BEFORE THE PROCEDURE  You should be present 60 minutes prior to your procedure or as directed.  PROCEDURE During the procedure, you will:  Be assisted by your urologist and a nurse.   Lie on a cystoscopy table with your knees elevated and legs apart and covered with a drape. For women this is the same position as when a pap smear is taken.   Have the urethral area or penis washed and covered with sterile towels.   Have an anesthetic  (numbing) jelly applied to the urethra. This is usually all that is required for females but males may also require sedation.   Have the cystoscope inserted through the urethra and into the bladder. Sterile fluid will flow through the cystoscope and into the bladder. This will expand the bladder and provide clear fluid for the urologist to look through and examine the interior of the bladder.   Be allowed to go home once you are doing well, are stable, and awake if you were given a sedative. If given a sedative, have someone give you a ride home.  AFTER THE PROCEDURE  You may have temporary bleeding and burning on urination.   Drink lots of fluids.  SEEK IMMEDIATE MEDICAL CARE IF:  There is an increase in blood in the urine or if you are passing clots.   You have difficulty in passing your urine   You develop chills and/or an unexplained oral temperature above 102 F (38.9 C).  Your caregiver will discuss your results with you following the procedure. This may be at a later time if you have been sedated. If other testing or biopsies were taken, ask your caregiver how you are to obtain the results. Remember it is your responsibility to get your results. Do not assume everything is normal if you do not hear from your caregiver. Document Released: 03/26/2000 Document Revised: 03/18/2011 Document Reviewed: 01/18/2008 [facility] Patient Information 2012 [place], [place].   Remove foley catheter in the morning Post Anesthesia Home Care Instructions  Activity: Get  plenty of rest for the remainder of the day. A responsible adult should stay with you for 24 hours following the procedure.  For the next 24 hours, DO NOT: -Drive a car -Advertising copywriter -Drink alcoholic beverages -Take any medication unless instructed by your physician -Make any legal decisions or sign important papers.  Meals: Start with liquid foods such as gelatin or soup. Progress to regular foods as tolerated. Avoid greasy,  spicy, heavy foods. If nausea and/or vomiting occur, drink only clear liquids until the nausea and/or vomiting subsides. Call your physician if vomiting continues.  Special Instructions/Symptoms: Your throat may feel dry or sore from the anesthesia or the breathing tube placed in your throat during surgery. If this causes discomfort, gargle with warm salt water. The discomfort should disappear within 24 hours.   Post Anesthesia Home Care Instructions  Activity: Get plenty of rest for the remainder of the day. A responsible adult should stay with you for 24 hours following the procedure.  For the next 24 hours, DO NOT: -Drive a car -Advertising copywriter -Drink alcoholic beverages -Take any medication unless instructed by your physician -Make any legal decisions or sign important papers.  Meals: Start with liquid foods such as gelatin or soup. Progress to regular foods as tolerated. Avoid greasy, spicy, heavy foods. If nausea and/or vomiting occur, drink only clear liquids until the nausea and/or vomiting subsides. Call your physician if vomiting continues.  Special Instructions/Symptoms: Your throat may feel dry or sore from the anesthesia or the breathing tube placed in your throat during surgery. If this causes discomfort, gargle with warm salt water. The discomfort should disappear within 24 hours.  Marland Kitchen

## 2011-10-07 NOTE — Anesthesia Preprocedure Evaluation (Addendum)
Anesthesia Evaluation  Patient identified by MRN, date of birth, ID band Patient awake    Reviewed: Allergy & Precautions, H&P , NPO status , Patient's Chart, lab work & pertinent test results  Airway Mallampati: II TM Distance: >3 FB Neck ROM: Full    Dental No notable dental hx.    Pulmonary neg pulmonary ROS,  breath sounds clear to auscultation  Pulmonary exam normal       Cardiovascular Exercise Tolerance: Good hypertension, Pt. on medications and Pt. on home beta blockers + CAD + dysrhythmias Rhythm:Regular Rate:Normal     Neuro/Psych negative neurological ROS  negative psych ROS   GI/Hepatic negative GI ROS, Neg liver ROS,   Endo/Other  negative endocrine ROS  Renal/GU negative Renal ROS  negative genitourinary   Musculoskeletal negative musculoskeletal ROS (+)   Abdominal   Peds negative pediatric ROS (+)  Hematology negative hematology ROS (+)   Anesthesia Other Findings   Reproductive/Obstetrics negative OB ROS                          Anesthesia Physical Anesthesia Plan  ASA: III  Anesthesia Plan: General   Post-op Pain Management:    Induction: Intravenous  Airway Management Planned: LMA  Additional Equipment:   Intra-op Plan:   Post-operative Plan: Extubation in OR  Informed Consent: I have reviewed the patients History and Physical, chart, labs and discussed the procedure including the risks, benefits and alternatives for the proposed anesthesia with the patient or authorized representative who has indicated his/her understanding and acceptance.   Dental advisory given  Plan Discussed with: CRNA  Anesthesia Plan Comments: (No recent chest pains, no SOB, no GERD.)       Anesthesia Quick Evaluation

## 2011-10-07 NOTE — Brief Op Note (Signed)
10/07/2011  12:53 PM  PATIENT:  Scott [name]  78 y.o. male  PRE-OPERATIVE DIAGNOSIS:  Bladder Tumor  POST-OPERATIVE DIAGNOSIS:  Bladder Tumors  PROCEDURE:  Procedure(s) (LRB): TRANSURETHRAL RESECTION OF BLADDER TUMOR (TURBT) 2-5cm (N/A) CYSTOSCOPY (N/A) with BLADDER BIOPSY X 2  SURGEON:  Surgeon(s) and Role:    * Anner Crete, MD - Primary  PHYSICIAN ASSISTANT:   ASSISTANTS: none   ANESTHESIA:   general  EBL:  Total I/O In: 200 [I.V.:200] Out: -   BLOOD ADMINISTERED:none  DRAINS: Urinary Catheter (Foley)   LOCAL MEDICATIONS USED:  NONE  SPECIMEN:  Source of Specimen:  biopsies from right lateral was and posterior wall.   TUR chips from right trigone.  DISPOSITION OF SPECIMEN:  PATHOLOGY  COUNTS:  YES  TOURNIQUET:  * No tourniquets in log *  DICTATION: .Other Dictation: Dictation Number [id number]  PLAN OF CARE: Discharge to home after PACU  PATIENT DISPOSITION:  PACU - hemodynamically stable.   Delay start of Pharmacological VTE agent (>24hrs) due to surgical blood loss or risk of bleeding: yes

## 2011-10-07 NOTE — Anesthesia Postprocedure Evaluation (Signed)
  Anesthesia Post-op Note  Patient: Scott [name]  Procedure(s) Performed: Procedure(s) (LRB): TRANSURETHRAL RESECTION OF BLADDER TUMOR (TURBT) (N/A) CYSTOSCOPY (N/A)  Patient Location: PACU  Anesthesia Type: General  Level of Consciousness: awake and alert   Airway and Oxygen Therapy: Patient Spontanous Breathing  Post-op Pain: mild  Post-op Assessment: Post-op Vital signs reviewed, Patient's Cardiovascular Status Stable, Respiratory Function Stable, Patent Airway and No signs of Nausea or vomiting  Post-op Vital Signs: stable  Complications: No apparent anesthesia complications. Patient has PVC's the same as he did preoperatively.

## 2011-10-08 ENCOUNTER — Encounter (HOSPITAL_BASED_OUTPATIENT_CLINIC_OR_DEPARTMENT_OTHER): Payer: Self-pay | Admitting: Urology

## 2011-10-08 NOTE — Op Note (Signed)
NAME:  Scott [name], [place] NO.:  [id number]  MEDICAL RECORD NO.:  [id number]  LOCATION:                                 FACILITY:  PHYSICIAN:  Excell Seltzer. Annabell Howells, M.D.    DATE OF BIRTH:  18-Jul-1921  DATE OF PROCEDURE: DATE OF DISCHARGE:                              OPERATIVE REPORT   PROCEDURE:  Cystoscopy, bladder biopsy x2, and transurethral resection of 3-cm bladder tumor.  PREOPERATIVE DIAGNOSIS:  Bladder tumor.  POSTOPERATIVE DIAGNOSIS:  Bladder tumors.  SURGEON:  Excell Seltzer. Annabell Howells, M.D.  ANESTHESIA:  General.  SPECIMEN:  Biopsies from the right lateral wall, posterior wall, and TUR chips from the right lateral trigone.  DRAINS:  22-French Foley catheter.  BLOOD LOSS:  Minimal.  COMPLICATIONS:  None.  INDICATIONS:  Mr. Albarran is an 78 year old African American male who was evaluated for hematuria and was found to have a papillary bladder tumor in the area of the right trigone lateral to the orifice.  He is to undergo TURBT.  I felt that I would defer mitomycin in this individual because of his age and the low-grade appearance of the tumor on office cystoscopy.  FINDINGS FROM PROCEDURE:  He was given Cipro, he was taken to the operating room, where general anesthetic was induced.  He was placed in lithotomy position and fitted with PAS hose.  Perineum and genitalia were prepped with Betadine solution and draped in the usual sterile fashion.  Cystoscopy was performed using a 22-French scope and 12-degree lens examination revealed a normal urethra.  The external sphincter was intact.  The prostatic urethra was approximately 3 to 4 cm in length with trilobar hyperplasia with moderately large middle lobe.  There was some degree of obstruction.  Examination of bladder revealed mild-to- moderate trabeculation.  The ureteral orifices were unremarkable. Approximately 2 to 3 cm lateral to the right orifice, was a papillary tumor that measured approximately 2 x 3  cm.  Further inspection of bladder demonstrated a small sub 5 mm tumor on the right lateral wall, and there were some mild mucosal changes on the posterior wall.  After thorough inspection, cut biopsy forceps were used to biopsy the tumor on the right lateral wall and mucosal abnormality on the posterior wall.  A 28-French continuous flow resectoscope sheath was then inserted __________ handle with 12-degrees lens and a gyrus bipolar loop, saline was used as the irrigant.  The biopsy sites were then fulgurated and then I resected the tumor from the right lateral trigone.  The tumor did not have a single stalk, it was more spreading with several implants in the area of resection.  Resection was carried down until fat was visualized in one small area.  The resection bed was then fulgurated and the surrounding mucosa was fulgurated as well.  Once hemostasis was achieved, and all visible lesions were destroyed or resected.  The tumor chips were removed and gathered to send it to pathology.  At this point, final inspection revealed good hemostasis.  The resectoscope was removed.  A 22-French Foley catheter was inserted. Balloon was filled with 10 mL sterile fluid.  The catheter had clear return and was placed  to straight drainage.  The patient was taken down from lithotomy position.  His anesthetic was reversed.  He was moved to recovery room in stable condition.  There were no complications.     Excell Seltzer. Annabell Howells, M.D.     JJW/MEDQ  D:  10/07/2011  T:  10/08/2011  Job:  [id number]

## 2011-10-21 DIAGNOSIS — C672 Malignant neoplasm of lateral wall of bladder: Secondary | ICD-10-CM | POA: Diagnosis not present

## 2012-01-17 ENCOUNTER — Encounter: Payer: Self-pay | Admitting: Cardiology

## 2012-01-17 DIAGNOSIS — Z23 Encounter for immunization: Secondary | ICD-10-CM | POA: Diagnosis not present

## 2012-01-17 DIAGNOSIS — E559 Vitamin D deficiency, unspecified: Secondary | ICD-10-CM | POA: Diagnosis not present

## 2012-01-17 DIAGNOSIS — I509 Heart failure, unspecified: Secondary | ICD-10-CM | POA: Diagnosis not present

## 2012-01-17 DIAGNOSIS — E039 Hypothyroidism, unspecified: Secondary | ICD-10-CM | POA: Diagnosis not present

## 2012-01-17 DIAGNOSIS — I10 Essential (primary) hypertension: Secondary | ICD-10-CM | POA: Diagnosis not present

## 2012-01-17 DIAGNOSIS — E785 Hyperlipidemia, unspecified: Secondary | ICD-10-CM | POA: Diagnosis not present

## 2012-01-20 DIAGNOSIS — R3915 Urgency of urination: Secondary | ICD-10-CM | POA: Diagnosis not present

## 2012-01-20 DIAGNOSIS — C679 Malignant neoplasm of bladder, unspecified: Secondary | ICD-10-CM | POA: Diagnosis not present

## 2012-01-20 DIAGNOSIS — N138 Other obstructive and reflux uropathy: Secondary | ICD-10-CM | POA: Diagnosis not present

## 2012-01-20 DIAGNOSIS — N401 Enlarged prostate with lower urinary tract symptoms: Secondary | ICD-10-CM | POA: Diagnosis not present

## 2012-01-25 ENCOUNTER — Telehealth: Payer: Self-pay | Admitting: Cardiology

## 2012-01-25 DIAGNOSIS — M48061 Spinal stenosis, lumbar region without neurogenic claudication: Secondary | ICD-10-CM | POA: Diagnosis not present

## 2012-01-25 NOTE — Telephone Encounter (Signed)
New problem:  Call the nurse back . Schedule for bladder surgery by Dr. Annabell Howells.

## 2012-01-25 NOTE — Telephone Encounter (Signed)
Left message pt has appointment with Dr Antoine Poche in the [facility] office  10/23 and clearance will be discussed then.

## 2012-02-02 ENCOUNTER — Encounter: Payer: Self-pay | Admitting: Cardiology

## 2012-02-02 ENCOUNTER — Ambulatory Visit (INDEPENDENT_AMBULATORY_CARE_PROVIDER_SITE_OTHER): Payer: Medicare Other | Admitting: Cardiology

## 2012-02-02 VITALS — BP 152/80 | HR 77 | Ht 67.0 in | Wt 166.0 lb

## 2012-02-02 DIAGNOSIS — E785 Hyperlipidemia, unspecified: Secondary | ICD-10-CM | POA: Diagnosis not present

## 2012-02-02 DIAGNOSIS — I10 Essential (primary) hypertension: Secondary | ICD-10-CM | POA: Diagnosis not present

## 2012-02-02 DIAGNOSIS — I4949 Other premature depolarization: Secondary | ICD-10-CM

## 2012-02-02 DIAGNOSIS — M48061 Spinal stenosis, lumbar region without neurogenic claudication: Secondary | ICD-10-CM | POA: Diagnosis not present

## 2012-02-02 DIAGNOSIS — I2510 Atherosclerotic heart disease of native coronary artery without angina pectoris: Secondary | ICD-10-CM

## 2012-02-02 DIAGNOSIS — I493 Ventricular premature depolarization: Secondary | ICD-10-CM

## 2012-02-02 NOTE — Patient Instructions (Addendum)
The current medical regimen is effective;  continue present plan and medications.  Follow up in 1 year with Dr Hochrein.  You will receive a letter in the mail 2 months before you are due.  Please call us when you receive this letter to schedule your follow up appointment.  

## 2012-02-02 NOTE — Progress Notes (Signed)
HPI The patient presents for evaluation of frequent ventricular ectopy and for preop clearance. He has had a workup for PVCs which have been frequent but he's not having any symptoms related to these. Since I saw him he did have cystoscopy. He also was hospitalized for evaluation of popliteal aneurysms with some thrombosis but this did not require further evaluation. Finally he was hospitalized at [facility] for apparent pneumonia. I don't have these records. There was a mention of heart failure which may have been diastolic dysfunction. He apparently had an echo but I don't have report of this. He is being considered apparently for a TURP.  Since I last saw him he has had some increased dyspnea with some exertion but this does not seem to be severe. He's not describing resting shortness of breath, PND or orthopnea. He has had no chest pressure, neck or arm discomfort. He doesn't describe any palpitations has not bothered by his PVCs. He has no presyncope or syncope. He takes care of wife who is paralyzed and does a lot of the house activities.  No Known Allergies  Current Outpatient Prescriptions  Medication Sig Dispense Refill  . amLODipine (NORVASC) 10 MG tablet 5 mg daily.       . bimatoprost (LUMIGAN) 0.03 % ophthalmic solution Place 1 drop into the right eye at bedtime.      . fenofibrate 160 MG tablet Take 160 mg by mouth daily.       . metoprolol (TOPROL-XL) 100 MG 24 hr tablet Take 100 mg by mouth every morning. 1/2 po daily      . niacin (NIASPAN) 1000 MG CR tablet Take 1,000 mg by mouth at bedtime.       Marland Kitchen olmesartan-hydrochlorothiazide (BENICAR HCT) 20-12.5 MG per tablet Take 1 tablet by mouth daily.       Marland Kitchen oxybutynin (DITROPAN) 5 MG tablet Take 5 mg by mouth daily.       . potassium chloride (KLOR-CON) 10 MEQ CR tablet Take 5 mEq by mouth daily.       . simvastatin (ZOCOR) 10 MG tablet Take 10 mg by mouth at bedtime.       Marland Kitchen aspirin 81 MG tablet Take 81 mg by mouth daily.        Marland Kitchen HYDROcodone-acetaminophen (NORCO/VICODIN) 5-325 MG per tablet       . oxybutynin (DITROPAN-XL) 5 MG 24 hr tablet       . Tamsulosin HCl (FLOMAX) 0.4 MG CAPS Take 0.4 mg by mouth daily.         Past Medical History  Diagnosis Date  . HTN (hypertension)   . Hyperlipidemia   . PVCs (premature ventricular contractions)   . Glaucoma(365)   . Bladder tumor   . Stenosis, spinal, lumbar   . CAD (coronary artery disease) CARDIOLOGIST- DR HOCHRIEN -- LAST VIIST [id number] IN [facility]    Non obstructive 25% LAD 2006  . Lumbar spondylosis   . DDD (degenerative disc disease)   . Arthritis   . PVD (peripheral vascular disease)     Right ABI .75, Left .78 (2006)  . Popliteal artery aneurysm, bilateral DECUMENTED CHRONIC PARTIAL OCCLUSION--  PT DENIES CLAUDICATION OR ANY OTHER SYMPTOMS  . Frequency of urination   . Urgency of urination   . Nocturia     ROS:  As stated in the HPI and negative for all other systems.   PHYSICAL EXAM BP 152/80  Pulse 77  Ht 5\' 7"  (1.702 m)  Wt 166 lb (  75.297 kg)  BMI 26.00 kg/m2 GENERAL:  Well appearing HEENT:  Pupils equal round and reactive, fundi not visualized, oral mucosa unremarkable, poor dentition NECK:  No jugular venous distention, waveform within normal limits, carotid upstroke brisk and symmetric, no bruits, no thyromegaly LYMPHATICS:  No cervical, inguinal adenopathy LUNGS:  Clear to auscultation bilaterally BACK:  No CVA tenderness CHEST:  Unremarkable HEART:  PMI not displaced or sustained,S1 and S2 within normal limits, no S3, no S4, no clicks, no rubs, no murmurs ABD:  Flat, positive bowel sounds normal in frequency in pitch, no bruits, no rebound, no guarding, no midline pulsatile mass, no hepatomegaly, no splenomegaly EXT:  2 plus pulses upper and femorals.  Decreased DP/PT bilateral, left leg edema, no cyanosis no clubbing SKIN:  No rashes no nodules NEURO:  Cranial nerves II through XII grossly intact, motor grossly intact  throughout PSYCH:  Cognitively intact, oriented to person place and time  EKG:  Sinus tachycardia, rate 77, interventricular conduction delay, left axis deviation, frequent and consecutive premature ventricular contractions. No change from previous. 02/02/2012   ASSESSMENT AND PLAN  Preop - The patient had a stress perfusion study with evidence of  high risk findings last year. He has had no new symptoms. He is going for low-risk procedure from a cardiovascular standpoint. Therefore, according to ACC/AHA guidelines no further cardiovascular testing is suggested. He would be at acceptable risk for the planned procedure. Of note he needs particular attention needed to volume as he would be prone to diastolic dysfunction and volume overload.  Diastolic heart failure - He apparently had some heart failure symptoms while hospitalized for pneumonia. I do not suspect systolic heart failure as he had a preserved ejection fraction last year. We will manage this with salt restriction, blood pressure control and volume management.  PVCs (premature ventricular contractions) -  The patient does have frequent PVCs. He has nonsustained ventricular tachycardia and had nonobstructive coronary disease in 2006. At this point I will plan stress testing though he would not even exercise. If he has a normal perfusion study no further testing will be indicated for treatment as he is asymptomatic.  HTN (hypertension) -  Although his blood pressure is slightly elevated. However, I reviewed other readings in his blood pressure is typically well controlled. He is controlled at home. I will make no change to his regimen. I will not adjust his medications at this point. He can continue with blood pressure checks.  Hyperlipidemia -  This is followed closely by Dr. Christell Constant.

## 2012-02-08 ENCOUNTER — Other Ambulatory Visit: Payer: Self-pay | Admitting: Urology

## 2012-02-22 DIAGNOSIS — M48061 Spinal stenosis, lumbar region without neurogenic claudication: Secondary | ICD-10-CM | POA: Diagnosis not present

## 2012-02-22 DIAGNOSIS — L738 Other specified follicular disorders: Secondary | ICD-10-CM | POA: Diagnosis not present

## 2012-03-03 NOTE — Patient Instructions (Addendum)
[address]z  03/03/2012   Your procedure is scheduled on: 03/14/12  Report to Darrin Nipper at [phone].  Call this number if you have problems the morning of surgery 336-: [phone]   Remember:   Do not eat food or drink liquids After Midnight.      Take these medicines the morning of surgery with A SIP OF WATER: Metoprolol   Do not wear jewelry, make-up or nail polish.  Do not wear lotions, powders, or perfumes. You may wear deodorant.  Do not shave 48 hours prior to surgery. Men may shave face and neck.  Do not bring valuables to the hospital.  Contacts, dentures or bridgework may not be worn into surgery.  Leave suitcase in the car. After surgery it may be brought to your room.  For patients admitted to the hospital, checkout time is 11:00 AM the day of discharge.     Special Instructions: Shower using CHG 2 nights before surgery and the night before surgery.  If you shower the day of surgery use CHG.  Use special wash - you have one bottle of CHG for all showers.  You should use approximately 1/3 of the bottle for each shower.   Please read over the following fact sheets that you were given: MRSA Information.  Birdie Sons, RN  pre op nurse call if needed [phone]

## 2012-03-03 NOTE — Progress Notes (Signed)
02/02/12 EKG epic, 02/02/12 medical clearance note from Dr. Antoine Poche on epic, 09/22/10 ECHO on epic

## 2012-03-06 ENCOUNTER — Encounter (HOSPITAL_COMMUNITY): Payer: Self-pay | Admitting: Pharmacy Technician

## 2012-03-06 ENCOUNTER — Encounter (HOSPITAL_COMMUNITY): Payer: Self-pay

## 2012-03-06 ENCOUNTER — Ambulatory Visit (HOSPITAL_COMMUNITY)
Admission: RE | Admit: 2012-03-06 | Discharge: 2012-03-06 | Disposition: A | Discharge status: Home / Self Care | Payer: Medicare Other | Source: Ambulatory Visit | Attending: Urology | Admitting: Urology

## 2012-03-06 ENCOUNTER — Encounter (HOSPITAL_COMMUNITY)
Admission: RE | Admit: 2012-03-06 | Discharge: 2012-03-06 | Disposition: A | Discharge status: Home / Self Care | Payer: Medicare Other | Source: Ambulatory Visit | Attending: Urology | Admitting: Urology

## 2012-03-06 DIAGNOSIS — I10 Essential (primary) hypertension: Secondary | ICD-10-CM | POA: Diagnosis not present

## 2012-03-06 LAB — CBC
HCT: 37.7 % — ABNORMAL LOW (ref 39.0–52.0)
Hemoglobin: 13 g/dL (ref 13.0–17.0)
MCH: 30.6 pg (ref 26.0–34.0)
MCHC: 34.5 g/dL (ref 30.0–36.0)
MCV: 88.7 fL (ref 78.0–100.0)
Platelets: 202 10*3/uL (ref 150–400)
RBC: 4.25 MIL/uL (ref 4.22–5.81)
RDW: 14 % (ref 11.5–15.5)
WBC: 2.5 10*3/uL — ABNORMAL LOW (ref 4.0–10.5)

## 2012-03-06 LAB — BASIC METABOLIC PANEL
BUN: 17 mg/dL (ref 6–23)
CO2: 28 mEq/L (ref 19–32)
Calcium: 10.2 mg/dL (ref 8.4–10.5)
Chloride: 102 mEq/L (ref 96–112)
Creatinine, Ser: 1 mg/dL (ref 0.50–1.35)
GFR calc Af Amer: 75 mL/min — ABNORMAL LOW (ref >90)
GFR calc non Af Amer: 65 mL/min — ABNORMAL LOW (ref >90)
Glucose, Bld: 109 mg/dL — ABNORMAL HIGH (ref 70–99)
Potassium: 3.8 mEq/L (ref 3.5–5.1)
Sodium: 138 mEq/L (ref 135–145)

## 2012-03-06 LAB — SURGICAL PCR SCREEN
MRSA, PCR: POSITIVE — AB
Staphylococcus aureus: POSITIVE — AB

## 2012-03-06 NOTE — Progress Notes (Addendum)
EKG 02/02/12 [facility], cardiac clearance note from Dr. Antoine Poche 02/02/12 [facility], ECHO 09/22/10 [facility]

## 2012-03-06 NOTE — Progress Notes (Signed)
CBC results routed by Fax via [facility] to Dr. Annabell Howells

## 2012-03-06 NOTE — Progress Notes (Signed)
03/06/12 1145  OBSTRUCTIVE SLEEP APNEA  Have you ever been diagnosed with sleep apnea through a sleep study? No  Do you snore loudly (loud enough to be heard through closed doors)?  0  Do you often feel tired, fatigued, or sleepy during the daytime? 1  Has anyone observed you stop breathing during your sleep? 0  Do you have, or are you being treated for high blood pressure? 1  BMI more than 35 kg/m2? 0  Age over 76 years old? 1  Neck circumference greater than 40 cm/18 inches? 0  Gender: 1  Obstructive Sleep Apnea Score 4   Score 4 or greater  Results sent to PCP

## 2012-03-13 MED ORDER — CIPROFLOXACIN IN D5W 400 MG/200ML IV SOLN
400.0000 mg | INTRAVENOUS | Status: AC
  Administered 2012-03-14: 400 mg via INTRAVENOUS

## 2012-03-13 NOTE — H&P (Signed)
ctive Problems Problems  1. Benign Prostatic Hypertrophy With Urinary Obstruction 600.01 2. Bladder Cancer 188.9 3. Feelings Of Urinary Urgency 788.63  History of Present Illness     [name] returns today in f/u.  He has had history of small multifocal low grade NMIBC treated on 10/07/11.  He has had no hematuria.  He does have some issues with urgency that preceeded the procedure but it may have gotten worse.  He has BPH with BOO and is on tamsulosin and he also takes oxybutynin ER 5mg .   Past Medical History Problems  1. History of  Arthritis V13.4 2. History of  Glaucoma 365.9 3. History of  Gout 274.9 4. History of  Hypercholesterolemia 272.0 5. History of  Hypertension 401.9  Surgical History Problems  1. History of  Cystoscopy With Biopsy 2. History of  Cystoscopy With Fulguration Medium Lesion (2-5cm) 3. History of  No Surgical Problems  Current Meds 1. AmLODIPine Besylate 10 MG Oral Tablet; Therapy: 12Sep2013 to 2. Benicar HCT 20-12.5 MG Oral Tablet; Therapy: 14May2012 to 3. Fenofibrate 160 MG Oral Tablet; Therapy: 14May2012 to 4. Klor-Con 10 10 MEQ Oral Tablet Extended Release; Therapy: 01Aug2012 to 5. Lumigan 0.01 % Ophthalmic Solution; Therapy: 31Aug2012 to 6. Metoprolol Succinate ER 100 MG Oral Tablet Extended Release 24 Hour; Therapy: 31May2013 to 7. Niaspan 1000 MG Oral Tablet Extended Release; Therapy: 14May2012 to 8. Oxybutynin Chloride ER 5 MG Oral Tablet Extended Release 24 Hour; Therapy: 12Sep2013 to 9. Simvastatin 10 MG Oral Tablet; Therapy: 01Aug2012 to 10. Tamsulosin HCl 0.4 MG Oral Capsule; Therapy: 06Mar2012 to 11. TraMADol HCl 50 MG Oral Tablet; Therapy: 27Jun2013 to  Allergies Medication  1. No Known Drug Allergies  Family History Problems  1. Family history of  Death In The Family Father 2. Family history of  Death In The Family Mother deceased age 55 66. Family history of  Family Health Status Number Of Children 1 son  Social  History Problems  1. Caffeine Use 1 per day 2. Former Smoker V15.82 smoked 1/2 for 26yrs quit 61yrs ago 3. Marital History - Currently Married Denied  4. History of  Alcohol Use  Review of Systems Genitourinary, constitutional, skin, eye, otolaryngeal, hematologic/lymphatic, cardiovascular, pulmonary, endocrine, musculoskeletal, gastrointestinal, neurological and psychiatric system(s) were reviewed and pertinent findings if present are noted.  Gastrointestinal: no constipation.  Musculoskeletal: back pain (he has had some right low back and buttock pain for about 2-3 weeks. ).    Vitals Vital Signs [Data Includes: Last 1 Day]  10Oct2013 03:17PM  Blood Pressure: 182 / 93 Temperature: 98.6 F Heart Rate: 89  Physical Exam Constitutional: Well nourished and well developed . No acute distress.  Pulmonary: No respiratory distress and normal respiratory rhythm and effort.  Cardiovascular: Heart rate and rhythm are normal . No peripheral edema.    Results/Data Urine [Data Includes: Last 1 Day]   10Oct2013  COLOR AMBER   APPEARANCE CLEAR   SPECIFIC GRAVITY 1.020   pH 6.0   GLUCOSE NEG mg/dL  BILIRUBIN SMALL   KETONE TRACE mg/dL  BLOOD NEG   PROTEIN NEG mg/dL  UROBILINOGEN 4 mg/dL  NITRITE NEG   LEUKOCYTE ESTERASE NEG    Procedure  Procedure: Cystoscopy   Indication: History of Urothelial Carcinoma.  Informed Consent: Risks, benefits, and potential adverse events were discussed and informed consent was obtained from the patient.  Prep: The patient was prepped with betadine.  Anesthesia:. Local anesthesia was administered intraurethrally with 2% lidocaine jelly.  Procedure Note:  Urethral  meatus:. No abnormalities.  Anterior urethra: No abnormalities.  Prostatic urethra: No abnormalities . Estimated length was 3 cm. There was visual obstruction of the prostatic urethra. The lateral and median prostatic lobes were enlarged. An enlarged intravesical median lobe was  visualized.  Bladder: Visulization was clear. The ureteral orifices were in the normal anatomic position bilaterally and had clear efflux of urine. The mucosa was smooth without abnormalities. Examination of the bladder demonstrated erythematous mucosa (in the prior resection site without recurrence in that area. ) located on the right side, near the trigone of the bladder. A solitary tumor was visualized in the bladder. A papillary tumor was seen in the bladder measuring approximately.5 cm in size. This tumor was located on the posterior aspect of the bladder. The patient tolerated the procedure well.  Complications: None.    Assessment Assessed  1. Bladder Cancer 188.9 2. Feelings Of Urinary Urgency 788.63 3. Benign Prostatic Hypertrophy With Urinary Obstruction 600.01   He has a small recurrent tumor on the posterior wall and he has BPH with BOO with increased symptoms despite oxybutynin and tamsulosin.   Plan Benign Prostatic Hypertrophy With Urinary Obstruction (600.01)  1. Follow-up Schedule Surgery Office  Follow-up  Requested for: 10Oct2013 Health Maintenance (V70.0)  2. UA With REFLEX  Done: 10Oct2013 03:02PM   I discussed in office fulguration vs a trip to the OR for both a biopsy and fulguration and a Gyrus button TURP.  He and his son are in agreement with the later procedure.  I discussed the risks of bleeding, infection, injury to adjacent structures, stricturing, incontinence, thrombotic events and anesthetic risks.  I reviewed the expected outcome of a 95% chance of improved flow and a 70% chance of improvement in his irritative symptoms.

## 2012-03-14 ENCOUNTER — Encounter (HOSPITAL_COMMUNITY)
Admission: RE | Disposition: A | Discharge status: Home / Self Care | Payer: Self-pay | Source: Ambulatory Visit | Attending: Urology

## 2012-03-14 ENCOUNTER — Ambulatory Visit (HOSPITAL_COMMUNITY)
Admission: RE | Admit: 2012-03-14 | Discharge: 2012-03-15 | Disposition: A | Discharge status: Home / Self Care | Payer: Medicare Other | Source: Ambulatory Visit | Attending: Urology | Admitting: Urology

## 2012-03-14 ENCOUNTER — Encounter (HOSPITAL_COMMUNITY): Payer: Self-pay | Admitting: Anesthesiology

## 2012-03-14 ENCOUNTER — Encounter (HOSPITAL_COMMUNITY): Payer: Self-pay | Admitting: *Deleted

## 2012-03-14 ENCOUNTER — Ambulatory Visit (HOSPITAL_COMMUNITY): Payer: Medicare Other | Admitting: Anesthesiology

## 2012-03-14 DIAGNOSIS — D303 Benign neoplasm of bladder: Secondary | ICD-10-CM | POA: Diagnosis not present

## 2012-03-14 DIAGNOSIS — Z8551 Personal history of malignant neoplasm of bladder: Secondary | ICD-10-CM | POA: Diagnosis not present

## 2012-03-14 DIAGNOSIS — Z79899 Other long term (current) drug therapy: Secondary | ICD-10-CM | POA: Diagnosis not present

## 2012-03-14 DIAGNOSIS — R3915 Urgency of urination: Secondary | ICD-10-CM | POA: Diagnosis not present

## 2012-03-14 DIAGNOSIS — I10 Essential (primary) hypertension: Secondary | ICD-10-CM | POA: Diagnosis not present

## 2012-03-14 DIAGNOSIS — N410 Acute prostatitis: Secondary | ICD-10-CM | POA: Diagnosis not present

## 2012-03-14 DIAGNOSIS — C679 Malignant neoplasm of bladder, unspecified: Secondary | ICD-10-CM | POA: Diagnosis present

## 2012-03-14 DIAGNOSIS — I2510 Atherosclerotic heart disease of native coronary artery without angina pectoris: Secondary | ICD-10-CM | POA: Insufficient documentation

## 2012-03-14 DIAGNOSIS — N401 Enlarged prostate with lower urinary tract symptoms: Secondary | ICD-10-CM | POA: Diagnosis not present

## 2012-03-14 DIAGNOSIS — N320 Bladder-neck obstruction: Secondary | ICD-10-CM | POA: Diagnosis not present

## 2012-03-14 DIAGNOSIS — Z01812 Encounter for preprocedural laboratory examination: Secondary | ICD-10-CM | POA: Diagnosis not present

## 2012-03-14 DIAGNOSIS — IMO0002 Reserved for concepts with insufficient information to code with codable children: Secondary | ICD-10-CM | POA: Diagnosis not present

## 2012-03-14 DIAGNOSIS — N138 Other obstructive and reflux uropathy: Secondary | ICD-10-CM | POA: Diagnosis not present

## 2012-03-14 DIAGNOSIS — E7800 Pure hypercholesterolemia, unspecified: Secondary | ICD-10-CM | POA: Diagnosis not present

## 2012-03-14 HISTORY — PX: CYSTOSCOPY WITH BIOPSY: SHX5122

## 2012-03-14 HISTORY — PX: TRANSURETHRAL RESECTION OF PROSTATE: SHX73

## 2012-03-14 SURGERY — CYSTOSCOPY, WITH BIOPSY
Anesthesia: General | Wound class: Clean Contaminated

## 2012-03-14 MED ORDER — LACTATED RINGERS IV SOLN
INTRAVENOUS | Status: DC | PRN
  Administered 2012-03-14: 07:00:00 via INTRAVENOUS

## 2012-03-14 MED ORDER — PROMETHAZINE HCL 25 MG/ML IJ SOLN: 6.2500 mg | INTRAMUSCULAR | Status: DC | PRN

## 2012-03-14 MED ORDER — IRBESARTAN 150 MG PO TABS
150.0000 mg | ORAL_TABLET | Freq: Every day | ORAL | Status: DC
  Administered 2012-03-14 – 2012-03-15 (×2): 150 mg via ORAL
  Filled 2012-03-14 (×2): qty 1

## 2012-03-14 MED ORDER — ACETAMINOPHEN 325 MG PO TABS: 650.0000 mg | ORAL_TABLET | ORAL | Status: DC | PRN

## 2012-03-14 MED ORDER — FENOFIBRATE 160 MG PO TABS
160.0000 mg | ORAL_TABLET | Freq: Every day | ORAL | Status: DC
  Administered 2012-03-14 – 2012-03-15 (×2): 160 mg via ORAL
  Filled 2012-03-14 (×2): qty 1

## 2012-03-14 MED ORDER — CIPROFLOXACIN HCL 250 MG PO TABS
250.0000 mg | ORAL_TABLET | Freq: Two times a day (BID) | ORAL | Status: DC
  Administered 2012-03-14 – 2012-03-15 (×3): 250 mg via ORAL
  Filled 2012-03-14 (×5): qty 1

## 2012-03-14 MED ORDER — OXYBUTYNIN CHLORIDE ER 5 MG PO TB24
5.0000 mg | ORAL_TABLET | Freq: Every day | ORAL | Status: DC
  Administered 2012-03-14 – 2012-03-15 (×2): 5 mg via ORAL
  Filled 2012-03-14 (×2): qty 1

## 2012-03-14 MED ORDER — BIOTENE DRY MOUTH MT LIQD
15.0000 mL | Freq: Two times a day (BID) | OROMUCOSAL | Status: DC
  Administered 2012-03-14 – 2012-03-15 (×3): 15 mL via OROMUCOSAL

## 2012-03-14 MED ORDER — ONDANSETRON HCL 4 MG/2ML IJ SOLN: 4.0000 mg | INTRAMUSCULAR | Status: DC | PRN

## 2012-03-14 MED ORDER — AMLODIPINE BESYLATE 10 MG PO TABS
10.0000 mg | ORAL_TABLET | Freq: Every day | ORAL | Status: DC
  Administered 2012-03-14 – 2012-03-15 (×2): 10 mg via ORAL
  Filled 2012-03-14 (×2): qty 1

## 2012-03-14 MED ORDER — FENTANYL CITRATE 0.05 MG/ML IJ SOLN
25.0000 ug | INTRAMUSCULAR | Status: DC | PRN
  Administered 2012-03-14: 25 ug via INTRAVENOUS

## 2012-03-14 MED ORDER — KCL IN DEXTROSE-NACL 10-5-0.45 MEQ/L-%-% IV SOLN
INTRAVENOUS | Status: AC
  Filled 2012-03-14: qty 1000

## 2012-03-14 MED ORDER — NIACIN ER (ANTIHYPERLIPIDEMIC) 500 MG PO TBCR
1000.0000 mg | EXTENDED_RELEASE_TABLET | Freq: Every day | ORAL | Status: DC
  Administered 2012-03-14: 1000 mg via ORAL
  Filled 2012-03-14 (×2): qty 2

## 2012-03-14 MED ORDER — ONDANSETRON HCL 4 MG/2ML IJ SOLN
INTRAMUSCULAR | Status: DC | PRN
  Administered 2012-03-14: 4 mg via INTRAVENOUS

## 2012-03-14 MED ORDER — SODIUM CHLORIDE 0.9 % IR SOLN
Status: DC | PRN
  Administered 2012-03-14: 12000 mL

## 2012-03-14 MED ORDER — LIDOCAINE HCL (CARDIAC) 20 MG/ML IV SOLN
INTRAVENOUS | Status: DC | PRN
  Administered 2012-03-14: 50 mg via INTRAVENOUS

## 2012-03-14 MED ORDER — FENTANYL CITRATE 0.05 MG/ML IJ SOLN
INTRAMUSCULAR | Status: DC | PRN
  Administered 2012-03-14 (×2): 50 ug via INTRAVENOUS

## 2012-03-14 MED ORDER — HYDROCODONE-ACETAMINOPHEN 5-325 MG PO TABS: 1.0000 | ORAL_TABLET | ORAL | Status: DC | PRN

## 2012-03-14 MED ORDER — DOCUSATE SODIUM 100 MG PO CAPS
100.0000 mg | ORAL_CAPSULE | Freq: Two times a day (BID) | ORAL | Status: DC
  Administered 2012-03-14 – 2012-03-15 (×2): 100 mg via ORAL
  Filled 2012-03-14 (×3): qty 1

## 2012-03-14 MED ORDER — PROPOFOL 10 MG/ML IV BOLUS
INTRAVENOUS | Status: DC | PRN
  Administered 2012-03-14: 150 mg via INTRAVENOUS

## 2012-03-14 MED ORDER — ACETAMINOPHEN 10 MG/ML IV SOLN
INTRAVENOUS | Status: DC | PRN
  Administered 2012-03-14: 1000 mg via INTRAVENOUS

## 2012-03-14 MED ORDER — LACTATED RINGERS IV SOLN: INTRAVENOUS | Status: DC

## 2012-03-14 MED ORDER — BISACODYL 10 MG RE SUPP: 10.0000 mg | Freq: Every day | RECTAL | Status: DC | PRN

## 2012-03-14 MED ORDER — KCL IN DEXTROSE-NACL 10-5-0.45 MEQ/L-%-% IV SOLN
INTRAVENOUS | Status: DC
  Administered 2012-03-14: 10:00:00 via INTRAVENOUS
  Filled 2012-03-14 (×3): qty 1000

## 2012-03-14 MED ORDER — BIMATOPROST 0.03 % OP SOLN
1.0000 [drp] | Freq: Every day | OPHTHALMIC | Status: DC
  Administered 2012-03-14: 1 [drp] via OPHTHALMIC
  Filled 2012-03-14: qty 2.5

## 2012-03-14 MED ORDER — SIMVASTATIN 10 MG PO TABS
10.0000 mg | ORAL_TABLET | Freq: Every day | ORAL | Status: DC
  Administered 2012-03-14: 10 mg via ORAL
  Filled 2012-03-14 (×2): qty 1

## 2012-03-14 MED ORDER — ACETAMINOPHEN 10 MG/ML IV SOLN
INTRAVENOUS | Status: AC
  Filled 2012-03-14: qty 100

## 2012-03-14 MED ORDER — METOPROLOL SUCCINATE ER 50 MG PO TB24
50.0000 mg | ORAL_TABLET | Freq: Every morning | ORAL | Status: DC
  Administered 2012-03-15: 50 mg via ORAL
  Filled 2012-03-14 (×2): qty 1

## 2012-03-14 MED ORDER — ZOLPIDEM TARTRATE 5 MG PO TABS: 5.0000 mg | ORAL_TABLET | Freq: Every evening | ORAL | Status: DC | PRN

## 2012-03-14 MED ORDER — CIPROFLOXACIN IN D5W 400 MG/200ML IV SOLN
INTRAVENOUS | Status: AC
  Filled 2012-03-14: qty 200

## 2012-03-14 MED ORDER — HYDROCHLOROTHIAZIDE 12.5 MG PO CAPS
12.5000 mg | ORAL_CAPSULE | Freq: Every day | ORAL | Status: DC
  Administered 2012-03-14 – 2012-03-15 (×2): 12.5 mg via ORAL
  Filled 2012-03-14 (×2): qty 1

## 2012-03-14 MED ORDER — OLMESARTAN MEDOXOMIL-HCTZ 20-12.5 MG PO TABS: 1.0000 | ORAL_TABLET | Freq: Every day | ORAL | Status: DC

## 2012-03-14 MED ORDER — MEPERIDINE HCL 50 MG/ML IJ SOLN: 6.2500 mg | INTRAMUSCULAR | Status: DC | PRN

## 2012-03-14 MED ORDER — HYDROMORPHONE HCL PF 1 MG/ML IJ SOLN: 0.5000 mg | INTRAMUSCULAR | Status: DC | PRN

## 2012-03-14 MED ORDER — FENTANYL CITRATE 0.05 MG/ML IJ SOLN
INTRAMUSCULAR | Status: AC
  Filled 2012-03-14: qty 2

## 2012-03-14 MED ORDER — POTASSIUM CHLORIDE CRYS ER 10 MEQ PO TBCR
10.0000 meq | EXTENDED_RELEASE_TABLET | Freq: Every day | ORAL | Status: DC
  Administered 2012-03-14 – 2012-03-15 (×2): 10 meq via ORAL
  Filled 2012-03-14 (×2): qty 1

## 2012-03-14 SURGICAL SUPPLY — 26 items
BAG URINE DRAINAGE (UROLOGICAL SUPPLIES) ×2 IMPLANT
BAG URO CATCHER STRL LF (DRAPE) ×2 IMPLANT
BLADE SURG 15 STRL LF DISP TIS (BLADE) IMPLANT
BLADE SURG 15 STRL SS (BLADE)
CATH FOLEY 2WAY SLVR 30CC 22FR (CATHETERS) ×2 IMPLANT
CATH FOLEY 3WAY 30CC 22FR (CATHETERS) IMPLANT
CLOTH BEACON ORANGE TIMEOUT ST (SAFETY) ×2 IMPLANT
DRAPE CAMERA CLOSED 9X96 (DRAPES) ×4 IMPLANT
ELECT BUTTON HF 24-28F 2 30DE (ELECTRODE) ×2 IMPLANT
ELECT HF RESECT BIPO 24F 45 ND (CUTTING LOOP) IMPLANT
ELECT LOOP MED HF 24F 12D (CUTTING LOOP) IMPLANT
ELECT LOOP MED HF 24F 12D CBL (CLIP) ×2 IMPLANT
ELECT REM PT RETURN 9FT ADLT (ELECTROSURGICAL) ×2
ELECTRODE REM PT RTRN 9FT ADLT (ELECTROSURGICAL) ×1 IMPLANT
GLOVE SURG SS PI 8.0 STRL IVOR (GLOVE) ×2 IMPLANT
GOWN PREVENTION PLUS XLARGE (GOWN DISPOSABLE) ×2 IMPLANT
GOWN STRL REIN XL XLG (GOWN DISPOSABLE) ×2 IMPLANT
HOLDER FOLEY CATH W/STRAP (MISCELLANEOUS) ×2 IMPLANT
IV NS IRRIG 3000ML ARTHROMATIC (IV SOLUTION) IMPLANT
KIT ASPIRATION TUBING (SET/KITS/TRAYS/PACK) ×2 IMPLANT
MANIFOLD NEPTUNE II (INSTRUMENTS) ×2 IMPLANT
PACK CYSTO (CUSTOM PROCEDURE TRAY) ×2 IMPLANT
SUT ETHILON 3 0 PS 1 (SUTURE) IMPLANT
SYR 30ML LL (SYRINGE) IMPLANT
SYRINGE IRR TOOMEY STRL 70CC (SYRINGE) IMPLANT
TUBING CONNECTING 10 (TUBING) ×2 IMPLANT

## 2012-03-14 NOTE — Brief Op Note (Signed)
03/14/2012  8:19 AM  PATIENT:  Scott [name]  78 y.o. male  PRE-OPERATIVE DIAGNOSIS:  RECURRENT BLADDER TUMOR BENIGN PROSTHETIC HYPERTROPHY WITH BLADDER OUTLET OBSTRUCTION  POST-OPERATIVE DIAGNOSIS:  RECURRENT BLADDER TUMOR BENIGN PROSTHETIC HYPERTROPHY WITH BLADDER OUTLET OBSTRUCTION  PROCEDURE:  Procedure(s) (LRB) with comments: CYSTOSCOPY WITH BIOPSY (N/A) - WITH FULGURATION TRANSURETHRAL RESECTION OF THE PROSTATE WITH GYRUS INSTRUMENTS (N/A)  SURGEON:  Surgeon(s) and Role:    * Anner Crete, MD - Primary  PHYSICIAN ASSISTANT:   ASSISTANTS: none   ANESTHESIA:   general  EBL:  Total I/O In: 700 [I.V.:700] Out: -   BLOOD ADMINISTERED:none  DRAINS: Urinary Catheter (Foley)   LOCAL MEDICATIONS USED:  NONE  SPECIMEN:  Source of Specimen:  bladder biopsy and TURP chips.   DISPOSITION OF SPECIMEN:  PATHOLOGY  COUNTS:  YES  TOURNIQUET:  * No tourniquets in log *  DICTATION: .Other Dictation: Dictation Number [id number]  PLAN OF CARE: Admit for overnight observation  PATIENT DISPOSITION:  PACU - hemodynamically stable.   Delay start of Pharmacological VTE agent (>24hrs) due to surgical blood loss or risk of bleeding: yes

## 2012-03-14 NOTE — Interval H&P Note (Signed)
History and Physical Interval Note:  03/14/2012 7:16 AM  Scott [name]  has presented today for surgery, with the diagnosis of RECURRENT BLADDER TUMOR BENIGN PROSTHETIC HYPERTROPHY WITH BLADDER OUTLET OBSTRUCTION  The various methods of treatment have been discussed with the patient and family. After consideration of risks, benefits and other options for treatment, the patient has consented to  Procedure(s) (LRB) with comments: CYSTOSCOPY WITH BIOPSY (N/A) - WITH FULGURATION TRANSURETHRAL RESECTION OF THE PROSTATE WITH GYRUS INSTRUMENTS (N/A) as a surgical intervention .  The patient's history has been reviewed, patient examined, no change in status, stable for surgery.  I have reviewed the patient's chart and labs.  Questions were answered to the patient's satisfaction.     [name] J  Lungs: clear. CV: RRR.  He has no new complaints or questions.

## 2012-03-14 NOTE — Anesthesia Preprocedure Evaluation (Signed)
Anesthesia Evaluation  Patient identified by MRN, date of birth, ID band Patient awake    Reviewed: Allergy & Precautions, H&P , NPO status , Patient's Chart, lab work & pertinent test results  Airway Mallampati: II TM Distance: >3 FB Neck ROM: Full    Dental No notable dental hx.    Pulmonary neg pulmonary ROS,  breath sounds clear to auscultation  Pulmonary exam normal       Cardiovascular Exercise Tolerance: Good hypertension, Pt. on medications and Pt. on home beta blockers + CAD + dysrhythmias Rhythm:Regular Rate:Normal     Neuro/Psych negative neurological ROS  negative psych ROS   GI/Hepatic negative GI ROS, Neg liver ROS,   Endo/Other  negative endocrine ROS  Renal/GU negative Renal ROS  negative genitourinary   Musculoskeletal negative musculoskeletal ROS (+)   Abdominal   Peds negative pediatric ROS (+)  Hematology negative hematology ROS (+)   Anesthesia Other Findings   Reproductive/Obstetrics negative OB ROS                           Anesthesia Physical  Anesthesia Plan  ASA: III  Anesthesia Plan: General   Post-op Pain Management:    Induction: Intravenous  Airway Management Planned: LMA  Additional Equipment:   Intra-op Plan:   Post-operative Plan: Extubation in OR  Informed Consent: I have reviewed the patients History and Physical, chart, labs and discussed the procedure including the risks, benefits and alternatives for the proposed anesthesia with the patient or authorized representative who has indicated his/her understanding and acceptance.   Dental advisory given  Plan Discussed with: CRNA  Anesthesia Plan Comments:         Anesthesia Quick Evaluation

## 2012-03-14 NOTE — Anesthesia Postprocedure Evaluation (Signed)
  Anesthesia Post-op Note  Patient: Scott [name]  Procedure(s) Performed: Procedure(s) (LRB): CYSTOSCOPY WITH BIOPSY (N/A) TRANSURETHRAL RESECTION OF THE PROSTATE WITH GYRUS INSTRUMENTS (N/A)  Patient Location: PACU  Anesthesia Type: General  Level of Consciousness: awake and alert   Airway and Oxygen Therapy: Patient Spontanous Breathing  Post-op Pain: mild  Post-op Assessment: Post-op Vital signs reviewed, Patient's Cardiovascular Status Stable, Respiratory Function Stable, Patent Airway and No signs of Nausea or vomiting  Last Vitals:  Filed Vitals:   03/14/12 0845  BP: 128/62  Pulse: 74  Temp:   Resp: 17    Post-op Vital Signs: stable   Complications: No apparent anesthesia complications

## 2012-03-14 NOTE — Op Note (Signed)
NAMEDERELLE, COCKRELL NO.:  [id number]  MEDICAL RECORD NO.:  [id number]  LOCATION:  [facility]                         FACILITY:  [facility]  PHYSICIAN:  Excell Seltzer. Annabell Howells, M.D.    DATE OF BIRTH:  1921-08-17  DATE OF PROCEDURE:  03/14/2012 DATE OF DISCHARGE:                              OPERATIVE REPORT   PROCEDURES: 1. Cystoscopy with bladder biopsy and fulguration. 2. Transurethral resection of prostate.  PREOPERATIVE DIAGNOSES: 1. Recurrent bladder tumor. 2. Benign prostatic hypertrophy. 3. Bladder outlet obstruction.  POSTOPERATIVE DIAGNOSES: 1. Recurrent bladder tumor. 2. Benign prostatic hypertrophy. 3. Bladder outlet obstruction.  SURGEON:  Excell Seltzer. Annabell Howells, MD  ANESTHESIA:  General.  SPECIMEN:  Bladder biopsy and TUR chips.  DRAIN:  A 22-French Foley catheter.  ESTIMATED BLOOD LOSS:  Minimal.  COMPLICATIONS:  None.  INDICATIONS:  Mr. Soltis is an 78 year old white male with a history of bladder tumors who was noted to have small recurrence on the posterior wall on recent surveillance cystoscopy.  He also has BPH with trilobar hyperplasia and obstruction, and he has had persistent significant symptoms on tamsulosin and oxybutynin.  He has elected to undergo TURP to see if that will provide further symptom relief.  FINDINGS OF PROCEDURE:  He was given Cipro.  He was taken to the operating room where general anesthetic was induced.  He was placed in lithotomy position.  His perineum and genitalia were prepped with Betadine solution and he was draped in usual sterile fashion. Cystoscopy was performed using the 22-French scope and the 12 and 70 degree lenses.  Examination revealed a normal urethra.  The external sphincter was intact.  The prostatic urethra was approximately 3 cm in length with trilobar hyperplasia with obstructing middle lobe. Examination of bladder revealed moderate trabeculation with some cellules on the mid posterior wall.  There was a  stellate scar from a prior resection.  Inferior lateral to that was a small patch of papillary appearing tumors that appeared low grade.  No other obvious abnormalities were noted and ureteral orifices were unremarkable.  The cup biopsy forceps were used to take 2 biopsies from the area of the papillary lesion.  At this point, the cystoscope was removed and a 28-French continuous flow resectoscope sheath was inserted.  This was fitted with an [place] handle.  A gyrus loop and a 12-degree lens.  Saline was used as the irrigant.  The biopsy site was then fulgurated with a TUR loop.  A couple of other areas with some erythema superior to the biopsy site were also fulgurated, although obvious tumors were not identified.  At this point, resection of prostate was performed, initiating with the middle lobe which was resected down to the bladder neck fibers.  The floor of the prostate was then resected out to and alongside the verumontanum.  Some tissue was resected from the lateral lobes as well. However, at this point, I switched from the loop to the button electrode, and continued to proceed with a TUR with vaporization.  The lateral lobes were vaporized from bladder neck to apex to create an adequate channel.  Additional vaporization of the floor in the middle lobe was performed  as well.  Once an adequate channel was created, the vaporization resection bed was generously fulgurated with the button electrode.  At this point, the bladder was evacuated free of chips.  Final inspection revealed intact ureteral orifices.  No retained tissue or clots.  There was no active bleeding from the resection bed.  The bladder was left full.  The scope was removed.  Pressure on the bladder produced an excellent stream.  A 22-French Foley catheter was inserted. The balloon was filled with 30 mL of sterile fluid.  The catheter was irrigated with an aseptic syringe with clear return and the catheter  was placed to straight drainage.  The patient was taken down from lithotomy position.  His anesthetic was reversed.  He was moved to recovery room in stable condition.  There were no complications.     Excell Seltzer. Annabell Howells, M.D.     JJW/MEDQ  D:  03/14/2012  T:  03/14/2012  Job:  [id number]  cc:   Dr. Briscoe Deutscher

## 2012-03-14 NOTE — Care Management Note (Signed)
    Page 1 of 1   03/15/2012     1:12:35 PM   CARE MANAGEMENT NOTE 03/15/2012  Patient:  Scott [name], Scott [name]   Account Number:  [id number]  Date Initiated:  03/14/2012  Documentation initiated by:  Lanier Clam  Subjective/Objective Assessment:   ADMITTED W/BPH,BLADDER CA.     Action/Plan:   FROM HOME   Anticipated DC Date:  03/15/2012   Anticipated DC Plan:  HOME/SELF CARE      DC Planning Services  CM consult      Choice offered to / List presented to:             Status of service:  Completed, signed off Medicare Important Message given?   (If response is "NO", the following Medicare IM given date fields will be blank) Date Medicare IM given:   Date Additional Medicare IM given:    Discharge Disposition:  HOME/SELF CARE  Per UR Regulation:  Reviewed for med. necessity/level of care/duration of stay  If discussed at Long Length of Stay Meetings, dates discussed:    Comments:  03/14/12 [facility] RN,BSN NCM [phone]

## 2012-03-14 NOTE — Transfer of Care (Signed)
Immediate Anesthesia Transfer of Care Note  Patient: Scott [name]  Procedure(s) Performed: Procedure(s) (LRB) with comments: CYSTOSCOPY WITH BIOPSY (N/A) - WITH FULGURATION TRANSURETHRAL RESECTION OF THE PROSTATE WITH GYRUS INSTRUMENTS (N/A)  Patient Location: PACU  Anesthesia Type:General  Level of Consciousness: sedated  Airway & Oxygen Therapy: Patient Spontanous Breathing and Patient connected to face mask oxygen  Post-op Assessment: Report given to PACU RN and Post -op Vital signs reviewed and stable  Post vital signs: Reviewed and stable  Complications: No apparent anesthesia complications

## 2012-03-15 ENCOUNTER — Encounter (HOSPITAL_COMMUNITY): Payer: Self-pay | Admitting: Urology

## 2012-03-15 DIAGNOSIS — N401 Enlarged prostate with lower urinary tract symptoms: Secondary | ICD-10-CM | POA: Diagnosis present

## 2012-03-15 DIAGNOSIS — N138 Other obstructive and reflux uropathy: Secondary | ICD-10-CM | POA: Diagnosis present

## 2012-03-15 DIAGNOSIS — C679 Malignant neoplasm of bladder, unspecified: Secondary | ICD-10-CM | POA: Diagnosis present

## 2012-03-15 MED ORDER — CIPROFLOXACIN HCL 250 MG PO TABS: 250.0000 mg | ORAL_TABLET | Freq: Two times a day (BID) | ORAL | Status: DC

## 2012-03-15 NOTE — Discharge Summary (Signed)
Physician Discharge Summary  Patient ID: Scott [name] MRN: [id number] DOB/AGE: [birth year] 76 y.o.  Admit date: 03/14/2012 Discharge date: 03/15/2012  Admission Diagnoses: Recurrent bladder cancer and BPH with BOO  Discharge Diagnoses: Same Principal Problem:  *Benign localized hyperplasia of prostate with urinary obstruction Active Problems:  Bladder cancer   Discharged Condition: good  Hospital Course: [name] was taken to the OR on 12/3 for a cystoscopy with bladder biopsy of a small recurrent tumor on the posterior wall and a TURP for his BPH with BOO.  His post op course has been unremarkable and the foley was removed this morning.  He is voiding well with pink urine and has no complaints.  He will be discharged home today.   Consults: None  Significant Diagnostic Studies: None  Treatments: surgery: Cystoscopy with bladder biopsy and TURP.   Discharge Exam: Blood pressure 138/58, pulse 80, temperature 99.6 F (37.6 C), temperature source Oral, resp. rate 18, height 5\' 7"  (1.702 m), weight 74.662 kg (164 lb 9.6 oz), SpO2 100.00%. General appearance: alert and no distress Resp: clear to auscultation bilaterally Cardio: regular rate and rhythm  Disposition: 01-Home or Self Care     Medication List     As of 03/15/2012  8:16 AM    STOP taking these medications         FLOMAX 0.4 MG Caps   Generic drug: Tamsulosin HCl      TAKE these medications         amLODipine 10 MG tablet   Commonly known as: NORVASC   Take 10 mg by mouth daily.      aspirin 81 MG tablet   Take 81 mg by mouth daily.      bimatoprost 0.03 % ophthalmic solution   Commonly known as: LUMIGAN   Place 1 drop into the right eye at bedtime.      ciprofloxacin 250 MG tablet   Commonly known as: CIPRO   Take 1 tablet (250 mg total) by mouth 2 (two) times daily.      fenofibrate 160 MG tablet   Take 160 mg by mouth daily.      HYDROcodone-acetaminophen 5-325 MG per tablet   Commonly known  as: NORCO/VICODIN   Take 1 tablet by mouth every 6 (six) hours as needed. For pain      metoprolol succinate 100 MG 24 hr tablet   Commonly known as: TOPROL-XL   Take 50 mg by mouth every morning.      niacin 1000 MG CR tablet   Commonly known as: NIASPAN   Take 1,000 mg by mouth at bedtime.      olmesartan-hydrochlorothiazide 20-12.5 MG per tablet   Commonly known as: BENICAR HCT   Take 1 tablet by mouth daily.      oxybutynin 5 MG 24 hr tablet   Commonly known as: DITROPAN-XL   Take 5 mg by mouth daily.      potassium chloride 10 MEQ CR tablet   Commonly known as: KLOR-CON   Take 10 mEq by mouth daily.      simvastatin 10 MG tablet   Commonly known as: ZOCOR   Take 10 mg by mouth at bedtime.           Follow-up Information    Follow up with Anner Crete, MD. On 03/27/2012. (215)    Contact information:   [address] [place] [address] [phone]          Signed: Bjorn Pippin  J 03/15/2012, 8:16 AM

## 2012-03-27 DIAGNOSIS — N138 Other obstructive and reflux uropathy: Secondary | ICD-10-CM | POA: Diagnosis not present

## 2012-03-27 DIAGNOSIS — N401 Enlarged prostate with lower urinary tract symptoms: Secondary | ICD-10-CM | POA: Diagnosis not present

## 2012-04-24 DIAGNOSIS — I509 Heart failure, unspecified: Secondary | ICD-10-CM | POA: Diagnosis not present

## 2012-04-24 DIAGNOSIS — I10 Essential (primary) hypertension: Secondary | ICD-10-CM | POA: Diagnosis not present

## 2012-04-24 DIAGNOSIS — R5383 Other fatigue: Secondary | ICD-10-CM | POA: Diagnosis not present

## 2012-04-24 DIAGNOSIS — E559 Vitamin D deficiency, unspecified: Secondary | ICD-10-CM | POA: Diagnosis not present

## 2012-04-24 DIAGNOSIS — E785 Hyperlipidemia, unspecified: Secondary | ICD-10-CM | POA: Diagnosis not present

## 2012-04-24 DIAGNOSIS — R5381 Other malaise: Secondary | ICD-10-CM | POA: Diagnosis not present

## 2012-05-02 DIAGNOSIS — B351 Tinea unguium: Secondary | ICD-10-CM | POA: Diagnosis not present

## 2012-05-02 DIAGNOSIS — M79609 Pain in unspecified limb: Secondary | ICD-10-CM | POA: Diagnosis not present

## 2012-05-02 DIAGNOSIS — G608 Other hereditary and idiopathic neuropathies: Secondary | ICD-10-CM | POA: Diagnosis not present

## 2012-05-09 ENCOUNTER — Ambulatory Visit: Payer: Medicare Other | Admitting: Physical Therapy

## 2012-05-15 ENCOUNTER — Ambulatory Visit: Payer: Medicare Other | Attending: Family Medicine | Admitting: Physical Therapy

## 2012-05-15 DIAGNOSIS — M6281 Muscle weakness (generalized): Secondary | ICD-10-CM | POA: Diagnosis not present

## 2012-05-15 DIAGNOSIS — R5381 Other malaise: Secondary | ICD-10-CM | POA: Insufficient documentation

## 2012-05-15 DIAGNOSIS — IMO0001 Reserved for inherently not codable concepts without codable children: Secondary | ICD-10-CM | POA: Diagnosis not present

## 2012-05-18 ENCOUNTER — Ambulatory Visit: Payer: Medicare Other | Admitting: Physical Therapy

## 2012-05-18 DIAGNOSIS — IMO0001 Reserved for inherently not codable concepts without codable children: Secondary | ICD-10-CM | POA: Diagnosis not present

## 2012-05-18 DIAGNOSIS — M6281 Muscle weakness (generalized): Secondary | ICD-10-CM | POA: Diagnosis not present

## 2012-05-18 DIAGNOSIS — R5381 Other malaise: Secondary | ICD-10-CM | POA: Diagnosis not present

## 2012-05-22 ENCOUNTER — Ambulatory Visit: Payer: Medicare Other | Admitting: *Deleted

## 2012-05-22 DIAGNOSIS — R5381 Other malaise: Secondary | ICD-10-CM | POA: Diagnosis not present

## 2012-05-22 DIAGNOSIS — IMO0001 Reserved for inherently not codable concepts without codable children: Secondary | ICD-10-CM | POA: Diagnosis not present

## 2012-05-22 DIAGNOSIS — M6281 Muscle weakness (generalized): Secondary | ICD-10-CM | POA: Diagnosis not present

## 2012-05-24 ENCOUNTER — Encounter: Payer: Medicare Other | Admitting: *Deleted

## 2012-05-26 ENCOUNTER — Encounter: Payer: Medicare Other | Admitting: *Deleted

## 2012-06-10 DIAGNOSIS — R55 Syncope and collapse: Secondary | ICD-10-CM

## 2012-06-10 HISTORY — DX: Syncope and collapse: R55

## 2012-06-12 ENCOUNTER — Encounter: Payer: Medicare Other | Admitting: *Deleted

## 2012-06-15 ENCOUNTER — Ambulatory Visit: Payer: Medicare Other | Attending: Family Medicine | Admitting: Physical Therapy

## 2012-06-15 DIAGNOSIS — R5381 Other malaise: Secondary | ICD-10-CM | POA: Insufficient documentation

## 2012-06-15 DIAGNOSIS — M6281 Muscle weakness (generalized): Secondary | ICD-10-CM | POA: Insufficient documentation

## 2012-06-15 DIAGNOSIS — IMO0001 Reserved for inherently not codable concepts without codable children: Secondary | ICD-10-CM | POA: Insufficient documentation

## 2012-06-17 DIAGNOSIS — E7800 Pure hypercholesterolemia, unspecified: Secondary | ICD-10-CM | POA: Diagnosis present

## 2012-06-17 DIAGNOSIS — I4729 Other ventricular tachycardia: Secondary | ICD-10-CM | POA: Diagnosis not present

## 2012-06-17 DIAGNOSIS — N390 Urinary tract infection, site not specified: Secondary | ICD-10-CM | POA: Diagnosis not present

## 2012-06-17 DIAGNOSIS — S199XXA Unspecified injury of neck, initial encounter: Secondary | ICD-10-CM | POA: Diagnosis not present

## 2012-06-17 DIAGNOSIS — R0602 Shortness of breath: Secondary | ICD-10-CM | POA: Diagnosis not present

## 2012-06-17 DIAGNOSIS — S0993XA Unspecified injury of face, initial encounter: Secondary | ICD-10-CM | POA: Diagnosis not present

## 2012-06-17 DIAGNOSIS — S298XXA Other specified injuries of thorax, initial encounter: Secondary | ICD-10-CM | POA: Diagnosis not present

## 2012-06-17 DIAGNOSIS — R091 Pleurisy: Secondary | ICD-10-CM | POA: Diagnosis not present

## 2012-06-17 DIAGNOSIS — I447 Left bundle-branch block, unspecified: Secondary | ICD-10-CM | POA: Diagnosis not present

## 2012-06-17 DIAGNOSIS — N289 Disorder of kidney and ureter, unspecified: Secondary | ICD-10-CM | POA: Diagnosis present

## 2012-06-17 DIAGNOSIS — I4949 Other premature depolarization: Secondary | ICD-10-CM | POA: Diagnosis not present

## 2012-06-17 DIAGNOSIS — R7989 Other specified abnormal findings of blood chemistry: Secondary | ICD-10-CM | POA: Diagnosis not present

## 2012-06-17 DIAGNOSIS — Z7982 Long term (current) use of aspirin: Secondary | ICD-10-CM | POA: Diagnosis not present

## 2012-06-17 DIAGNOSIS — I129 Hypertensive chronic kidney disease with stage 1 through stage 4 chronic kidney disease, or unspecified chronic kidney disease: Secondary | ICD-10-CM | POA: Diagnosis present

## 2012-06-17 DIAGNOSIS — Z79899 Other long term (current) drug therapy: Secondary | ICD-10-CM | POA: Diagnosis not present

## 2012-06-17 DIAGNOSIS — R51 Headache: Secondary | ICD-10-CM | POA: Diagnosis not present

## 2012-06-17 DIAGNOSIS — R55 Syncope and collapse: Secondary | ICD-10-CM | POA: Diagnosis not present

## 2012-06-17 DIAGNOSIS — D649 Anemia, unspecified: Secondary | ICD-10-CM | POA: Diagnosis present

## 2012-06-17 DIAGNOSIS — E860 Dehydration: Secondary | ICD-10-CM | POA: Diagnosis present

## 2012-06-17 DIAGNOSIS — E876 Hypokalemia: Secondary | ICD-10-CM | POA: Diagnosis present

## 2012-06-17 DIAGNOSIS — I472 Ventricular tachycardia, unspecified: Secondary | ICD-10-CM | POA: Diagnosis not present

## 2012-06-17 DIAGNOSIS — N189 Chronic kidney disease, unspecified: Secondary | ICD-10-CM | POA: Diagnosis present

## 2012-06-17 DIAGNOSIS — I440 Atrioventricular block, first degree: Secondary | ICD-10-CM | POA: Diagnosis present

## 2012-06-17 DIAGNOSIS — Z87891 Personal history of nicotine dependence: Secondary | ICD-10-CM | POA: Diagnosis not present

## 2012-06-17 DIAGNOSIS — I951 Orthostatic hypotension: Secondary | ICD-10-CM | POA: Diagnosis not present

## 2012-06-17 DIAGNOSIS — S0990XA Unspecified injury of head, initial encounter: Secondary | ICD-10-CM | POA: Diagnosis not present

## 2012-06-18 DIAGNOSIS — R55 Syncope and collapse: Secondary | ICD-10-CM

## 2012-06-19 ENCOUNTER — Encounter: Payer: Medicare Other | Admitting: *Deleted

## 2012-06-19 DIAGNOSIS — I4729 Other ventricular tachycardia: Secondary | ICD-10-CM

## 2012-06-19 DIAGNOSIS — R55 Syncope and collapse: Secondary | ICD-10-CM

## 2012-06-19 DIAGNOSIS — I472 Ventricular tachycardia: Secondary | ICD-10-CM

## 2012-06-20 DIAGNOSIS — R55 Syncope and collapse: Secondary | ICD-10-CM

## 2012-06-20 DIAGNOSIS — I472 Ventricular tachycardia: Secondary | ICD-10-CM

## 2012-06-20 DIAGNOSIS — I4729 Other ventricular tachycardia: Secondary | ICD-10-CM

## 2012-06-21 ENCOUNTER — Encounter: Payer: Self-pay | Admitting: Internal Medicine

## 2012-06-21 ENCOUNTER — Telehealth: Payer: Self-pay | Admitting: Internal Medicine

## 2012-06-21 ENCOUNTER — Encounter: Payer: Medicare Other | Admitting: *Deleted

## 2012-06-21 ENCOUNTER — Ambulatory Visit (INDEPENDENT_AMBULATORY_CARE_PROVIDER_SITE_OTHER): Payer: Medicare Other | Admitting: Internal Medicine

## 2012-06-21 ENCOUNTER — Telehealth: Payer: Self-pay | Admitting: Cardiology

## 2012-06-21 VITALS — BP 137/68 | HR 64 | Ht 67.0 in | Wt 163.8 lb

## 2012-06-21 DIAGNOSIS — I493 Ventricular premature depolarization: Secondary | ICD-10-CM

## 2012-06-21 DIAGNOSIS — R55 Syncope and collapse: Secondary | ICD-10-CM

## 2012-06-21 DIAGNOSIS — I10 Essential (primary) hypertension: Secondary | ICD-10-CM | POA: Diagnosis not present

## 2012-06-21 DIAGNOSIS — I4949 Other premature depolarization: Secondary | ICD-10-CM | POA: Diagnosis not present

## 2012-06-21 NOTE — Telephone Encounter (Signed)
Left heart cath - Thursday, 3/13 at 10:30 - main lab as outpatient - Shirlee Latch

## 2012-06-21 NOTE — Telephone Encounter (Signed)
This is an [place] office patient. I will forward

## 2012-06-21 NOTE — Assessment & Plan Note (Signed)
History is most suggestive of postural syncope.  I suspect orthostatic hypotension is the most likely cause for his event.  He is on multiple medicines which could contribute to this including HCTZ, norvasc, tamsulosin, and metoprolol.  Though AV block or VT are also on the differential, I think that these are less likely. At this time, I will stop benicar/ HCT.  Per recent update to HTN guidelines (JNC 8), his goal BP should be <150/90.   He will have an event monitor placed.  He is also scheduled for cath by Dr Shirlee Latch to evaluate WMA and EF decrease by recent echo. I will see him back for follow-up in 5-6 week.  He is instructed to not drive in the interim.  I have discussed this plan at length with the patient, his son, and his daughter-in-law who is a physician.  They are in agreement with this plan.

## 2012-06-21 NOTE — Assessment & Plan Note (Signed)
As above.

## 2012-06-21 NOTE — Telephone Encounter (Signed)
Call [name] with cath information.  They would like this scheduled for next Tues as wife will be in town.

## 2012-06-21 NOTE — Telephone Encounter (Signed)
Pt's son calling re making sure cath for tomorrow got rs to next week, pls call

## 2012-06-21 NOTE — Progress Notes (Signed)
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  He reports being in good health until this past Thursday.  He reports that he was lying down and then went to the kitchen.  He has not been standing very long when he had sudden loss of consciousness.  He quickly recovered and did not seek immediate medical attention.  He then had a second episode on Friday.  He reports that he was attempting to stand from a seated position when he became presyncopal.  He went to sit back down but the rolling chair slid out from under him and he slid to the floor.  He does not think that he lost full consciousness with this episode.  He did not seek medical attention until the next day when he presented to [facility]  Upon arrival, he had hypokalemia/ hypomagnesemia, and mild prerenal azotemia.  In retrospect, his son reports that the patient has had some depression recently which has worsened with the diagnosis of his sister with CA several weeks ago.  His PO intake has been depressed. At [facility], he was observed to have sinus rhythm with LBBB.  He has asymptomatic NSVT on telemetry which per report was short.  An echo revealed EF 40% with WMA.  He is planned for an elective outpatient cath. Today, he denies symptoms of palpitations, chest pain, shortness of breath, orthopnea, PND, lower extremity edema, or neurologic sequela.  He is active and denies any significant decrease in exercise tolerance.   The patient is tolerating medications without difficulties and is otherwise without complaint today.   Past Medical History  Diagnosis Date  . HTN (hypertension)   . Hyperlipidemia   . PVCs (premature ventricular contractions)   . Glaucoma(365)   . Bladder tumor   . CAD (coronary artery disease) CARDIOLOGIST- DR HOCHRIEN -- LAST VIIST [id number] IN EPIC    Non  obstructive 25% LAD 2006  . DDD (degenerative disc disease)   . Arthritis   . PVD (peripheral vascular disease)     Right ABI .75, Left .78 (2006)  . Popliteal artery aneurysm, bilateral DECUMENTED CHRONIC PARTIAL OCCLUSION--  PT DENIES CLAUDICATION OR ANY OTHER SYMPTOMS  . Frequency of urination   . Nocturia   . Bladder cancer 03/15/2012   Past Surgical History  Procedure Laterality Date  . Transthoracic echocardiogram  09-22-2010    MODERATE LVH/ EF 55%/ GRADE I DIASTOLIC DYSFUNCTION/ AORTIC SCLEROSIS WITHOUT STENOSIS/  RV  SYSTOLIC  MILDLY REDUCED FUNCTION  . Cardiovascular stress test  10-15-2010    LOW RISK NUCLEAR STUDY/ NO EVIDENCE OF ISCHEMIA/ NORMAL EF  . Cardiac catheterization  05-11-2004  DR [facility]    MINIMAL CORANARY PLAQUE/ NORMAL LVF/ EF 55%/  NON-OBSTRUCTIVE LAD 25%  . Shoulder surgery  20 YRS AGO  (APPROX. 1990'S)  . Transurethral resection of bladder tumor  10/07/2011    Procedure: TRANSURETHRAL RESECTION OF BLADDER TUMOR (TURBT);  Surgeon: Anner Crete, MD;  Location: [facility];  Service: Urology;  Laterality: N/A;  . Cystoscopy  10/07/2011    Procedure: CYSTOSCOPY;  Surgeon: Anner Crete, MD;  Location[facility];  Service: Urology;  Laterality: N/A;  . Cystoscopy with biopsy  03/14/2012    Procedure: CYSTOSCOPY WITH BIOPSY;  Surgeon: Anner Crete, MD;  Location: [facility];  Service: Urology;  Laterality:  N/A;  WITH FULGURATION  . Transurethral resection of prostate  03/14/2012    Procedure: TRANSURETHRAL RESECTION OF THE PROSTATE WITH GYRUS INSTRUMENTS;  Surgeon: Anner Crete, MD;  Location: [facility];  Service: Urology;  Laterality: N/A;    Current Outpatient Prescriptions  Medication Sig Dispense Refill  . amLODipine (NORVASC) 5 MG tablet Take 5 mg by mouth daily.      Marland Kitchen aspirin 81 MG tablet Take 81 mg by mouth daily.       . bimatoprost (LUMIGAN) 0.03 % ophthalmic solution Place 1 drop into the right eye at bedtime.      . fenofibrate  (TRICOR) 145 MG tablet Take 145 mg by mouth daily.      . fexofenadine (ALLEGRA) 180 MG tablet Take 180 mg by mouth as needed.      . metoprolol (TOPROL-XL) 100 MG 24 hr tablet Take 50 mg by mouth every morning.       . niacin (NIASPAN) 1000 MG CR tablet Take 1,000 mg by mouth at bedtime.       Marland Kitchen olmesartan-hydrochlorothiazide (BENICAR HCT) 20-12.5 MG per tablet Take 1 tablet by mouth daily.       Marland Kitchen oxybutynin (DITROPAN-XL) 5 MG 24 hr tablet Take 5 mg by mouth daily.       . potassium chloride (KLOR-CON) 10 MEQ CR tablet Take 10 mEq by mouth daily.       . sildenafil (VIAGRA) 100 MG tablet Take 100 mg by mouth daily as needed for erectile dysfunction.      . simvastatin (ZOCOR) 10 MG tablet Take 10 mg by mouth at bedtime.       . tamsulosin (FLOMAX) 0.4 MG CAPS Take 0.4 mg by mouth daily after supper.       No current facility-administered medications for this visit.    No Known Allergies  History   Social History  . Marital Status: Married    Spouse Name: N/A    Number of Children: 1  . Years of Education: N/A   Occupational History  . Retired    Social History Main Topics  . Smoking status: Former Smoker -- 1.00 packs/day for 20 years    Types: Cigarettes    Quit date: 04/13/1975  . Smokeless tobacco: Never Used  . Alcohol Use: Yes     Comment: drinks liquor 2-3 drinks most days  . Drug Use: No  . Sexually Active: Not on file   Other Topics Concern  . Not on file   Social History Narrative   Lives in [place] with spouse who is s/p spinal cord injury.  He has one grown son who lives in [place].  He is retired but worked for [employer] for years and lives in [place].    Family History  Problem Relation Age of Onset  . Hypertension Mother   Multiple family members with cancer.  Father died in his 4s of a "blood clot"  Denies FH of arrhythmia or sudden death  ROS- All systems are reviewed and negative except as per the HPI above  Physical Exam: Filed Vitals:    06/21/12 1125  BP: 137/68  Pulse: 64  Height: 5\' 7"  (1.702 m)  Weight: 163 lb 12.8 oz (74.299 kg)    GEN- The patient is elderly appearing, alert and oriented x 3 today.   Head- normocephalic, atraumatic Eyes-  Sclera clear, conjunctiva pink Ears- hearing intact Oropharynx- clear Neck- supple, no JVP Lymph- no cervical lymphadenopathy Lungs- Clear to ausculation  bilaterally, normal work of breathing Heart- Regular rate and rhythm, no murmurs, rubs or gallops, + wide S2 split GI- soft, NT, ND, + BS Extremities- no clubbing, cyanosis, or edema MS- age appropriate muscle atrophy Skin- no rash or lesion Psych- euthymic mood, full affect Neuro- strength and sensation are intact  EKG- sinus rhythm 64 bpm, PR 218, LBBB (QRS ) Echo EF 40% Records from [facility] reviewed  Assessment and Plan:

## 2012-06-21 NOTE — Telephone Encounter (Signed)
No precert required 

## 2012-06-21 NOTE — Telephone Encounter (Signed)
error 

## 2012-06-21 NOTE — Patient Instructions (Signed)
   Stop Benicar / HCTZ  No driving  21 day e-cardio heart monitor will be mailed to home  Heart catherization - pending  Allred - 6 weeks

## 2012-06-22 ENCOUNTER — Encounter (HOSPITAL_COMMUNITY): Admission: RE | Payer: Self-pay | Source: Ambulatory Visit

## 2012-06-22 ENCOUNTER — Other Ambulatory Visit: Payer: Self-pay | Admitting: *Deleted

## 2012-06-22 ENCOUNTER — Ambulatory Visit (HOSPITAL_COMMUNITY): Admission: RE | Admit: 2012-06-22 | Payer: Medicare Other | Source: Ambulatory Visit | Admitting: Cardiology

## 2012-06-22 DIAGNOSIS — I472 Ventricular tachycardia: Secondary | ICD-10-CM

## 2012-06-22 DIAGNOSIS — R55 Syncope and collapse: Secondary | ICD-10-CM

## 2012-06-22 DIAGNOSIS — I4729 Other ventricular tachycardia: Secondary | ICD-10-CM

## 2012-06-22 DIAGNOSIS — I471 Supraventricular tachycardia: Secondary | ICD-10-CM | POA: Diagnosis not present

## 2012-06-22 SURGERY — LEFT HEART CATHETERIZATION WITH CORONARY ANGIOGRAM
Anesthesia: LOCAL

## 2012-06-23 ENCOUNTER — Encounter: Payer: Self-pay | Admitting: Physician Assistant

## 2012-06-23 ENCOUNTER — Encounter: Payer: Self-pay | Admitting: *Deleted

## 2012-06-26 ENCOUNTER — Telehealth: Payer: Self-pay | Admitting: *Deleted

## 2012-06-26 NOTE — Telephone Encounter (Signed)
Heart cath cancelled for tomorrow, 3/18 due to weather.  Rescheduled for Wednesday, 3/19 at 7:30 with Dr. Excell Seltzer.  Patient to arrive at 6:30.  Patient aware.

## 2012-06-28 ENCOUNTER — Encounter (HOSPITAL_BASED_OUTPATIENT_CLINIC_OR_DEPARTMENT_OTHER)
Admission: RE | Disposition: A | Discharge status: Home / Self Care | Payer: Self-pay | Source: Ambulatory Visit | Attending: Cardiology

## 2012-06-28 ENCOUNTER — Inpatient Hospital Stay (HOSPITAL_BASED_OUTPATIENT_CLINIC_OR_DEPARTMENT_OTHER)
Admission: RE | Admit: 2012-06-28 | Discharge: 2012-06-28 | Disposition: A | Discharge status: Home / Self Care | Payer: Medicare Other | Source: Ambulatory Visit | Attending: Cardiology | Admitting: Cardiology

## 2012-06-28 DIAGNOSIS — I4729 Other ventricular tachycardia: Secondary | ICD-10-CM | POA: Insufficient documentation

## 2012-06-28 DIAGNOSIS — I472 Ventricular tachycardia, unspecified: Secondary | ICD-10-CM | POA: Insufficient documentation

## 2012-06-28 DIAGNOSIS — I447 Left bundle-branch block, unspecified: Secondary | ICD-10-CM | POA: Diagnosis not present

## 2012-06-28 DIAGNOSIS — I10 Essential (primary) hypertension: Secondary | ICD-10-CM | POA: Insufficient documentation

## 2012-06-28 DIAGNOSIS — E785 Hyperlipidemia, unspecified: Secondary | ICD-10-CM | POA: Insufficient documentation

## 2012-06-28 DIAGNOSIS — I428 Other cardiomyopathies: Secondary | ICD-10-CM | POA: Diagnosis not present

## 2012-06-28 SURGERY — JV LEFT HEART CATHETERIZATION WITH CORONARY ANGIOGRAM

## 2012-06-28 MED ORDER — ONDANSETRON HCL 4 MG/2ML IJ SOLN: 4.0000 mg | Freq: Four times a day (QID) | INTRAMUSCULAR | Status: DC | PRN

## 2012-06-28 MED ORDER — ACETAMINOPHEN 325 MG PO TABS: 650.0000 mg | ORAL_TABLET | ORAL | Status: DC | PRN

## 2012-06-28 NOTE — Interval H&P Note (Signed)
History and Physical Interval Note:  06/28/2012 8:02 AM  Scott [name]  has presented today for surgery, with the diagnosis of cp  The various methods of treatment have been discussed with the patient and family. After consideration of risks, benefits and other options for treatment, the patient has consented to  Procedure(s): JV LEFT HEART CATHETERIZATION WITH CORONARY ANGIOGRAM (N/A) as a surgical intervention .  The patient's history has been reviewed, patient examined, no change in status, stable for surgery.  I have reviewed the patient's chart and labs.  Questions were answered to the patient's satisfaction.     Sanela Evola [employer]

## 2012-06-28 NOTE — OR Nursing (Signed)
Tegaderm dressing applied, site level 0, bedrest begins at 0915 

## 2012-06-28 NOTE — H&P (View-Only) (Signed)
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  He reports being in good health until this past Thursday.  He reports that he was lying down and then went to the kitchen.  He has not been standing very long when he had sudden loss of consciousness.  He quickly recovered and did not seek immediate medical attention.  He then had a second episode on Friday.  He reports that he was attempting to stand from a seated position when he became presyncopal.  He went to sit back down but the rolling chair slid out from under him and he slid to the floor.  He does not think that he lost full consciousness with this episode.  He did not seek medical attention until the next day when he presented to [facility].  Upon arrival, he had hypokalemia/ hypomagnesemia, and mild prerenal azotemia.  In retrospect, his son reports that the patient has had some depression recently which has worsened with the diagnosis of his sister with CA several weeks ago.  His PO intake has been depressed. At [facility], he was observed to have sinus rhythm with LBBB.  He has asymptomatic NSVT on telemetry which per report was short.  An echo revealed EF 40% with WMA.  He is planned for an elective outpatient cath. Today, he denies symptoms of palpitations, chest pain, shortness of breath, orthopnea, PND, lower extremity edema, or neurologic sequela.  He is active and denies any significant decrease in exercise tolerance.   The patient is tolerating medications without difficulties and is otherwise without complaint today.   Past Medical History  Diagnosis Date  . HTN (hypertension)   . Hyperlipidemia   . PVCs (premature ventricular contractions)   . Glaucoma(365)   . Bladder tumor   . CAD (coronary artery disease) CARDIOLOGIST- DR HOCHRIEN -- LAST VIIST [id number] IN EPIC    Non  obstructive 25% LAD 2006  . DDD (degenerative disc disease)   . Arthritis   . PVD (peripheral vascular disease)     Right ABI .75, Left .78 (2006)  . Popliteal artery aneurysm, bilateral DECUMENTED CHRONIC PARTIAL OCCLUSION--  PT DENIES CLAUDICATION OR ANY OTHER SYMPTOMS  . Frequency of urination   . Nocturia   . Bladder cancer 03/15/2012   Past Surgical History  Procedure Laterality Date  . Transthoracic echocardiogram  09-22-2010    MODERATE LVH/ EF 55%/ GRADE I DIASTOLIC DYSFUNCTION/ AORTIC SCLEROSIS WITHOUT STENOSIS/  RV  SYSTOLIC  MILDLY REDUCED FUNCTION  . Cardiovascular stress test  10-15-2010    LOW RISK NUCLEAR STUDY/ NO EVIDENCE OF ISCHEMIA/ NORMAL EF  . Cardiac catheterization  05-11-2004  DR [facility]    MINIMAL CORANARY PLAQUE/ NORMAL LVF/ EF 55%/  NON-OBSTRUCTIVE LAD 25%  . Shoulder surgery  20 YRS AGO  (APPROX. 1990'S)  . Transurethral resection of bladder tumor  10/07/2011    Procedure: TRANSURETHRAL RESECTION OF BLADDER TUMOR (TURBT);  Surgeon: Anner Crete, MD;  Location: [facility];  Service: Urology;  Laterality: N/A;  . Cystoscopy  10/07/2011    Procedure: CYSTOSCOPY;  Surgeon: Anner Crete, MD;  Location: [facility];  Service: Urology;  Laterality: N/A;  . Cystoscopy with biopsy  03/14/2012    Procedure: CYSTOSCOPY WITH BIOPSY;  Surgeon: Anner Crete, MD;  Location[facility];  Service: Urology;  Laterality:  N/A;  WITH FULGURATION  . Transurethral resection of prostate  03/14/2012    Procedure: TRANSURETHRAL RESECTION OF THE PROSTATE WITH GYRUS INSTRUMENTS;  Surgeon: Anner Crete, MD;  Location[facility];  Service: Urology;  Laterality: N/A;    Current Outpatient Prescriptions  Medication Sig Dispense Refill  . amLODipine (NORVASC) 5 MG tablet Take 5 mg by mouth daily.      Marland Kitchen aspirin 81 MG tablet Take 81 mg by mouth daily.       . bimatoprost (LUMIGAN) 0.03 % ophthalmic solution Place 1 drop into the right eye at bedtime.      . fenofibrate  (TRICOR) 145 MG tablet Take 145 mg by mouth daily.      . fexofenadine (ALLEGRA) 180 MG tablet Take 180 mg by mouth as needed.      . metoprolol (TOPROL-XL) 100 MG 24 hr tablet Take 50 mg by mouth every morning.       . niacin (NIASPAN) 1000 MG CR tablet Take 1,000 mg by mouth at bedtime.       Marland Kitchen olmesartan-hydrochlorothiazide (BENICAR HCT) 20-12.5 MG per tablet Take 1 tablet by mouth daily.       Marland Kitchen oxybutynin (DITROPAN-XL) 5 MG 24 hr tablet Take 5 mg by mouth daily.       . potassium chloride (KLOR-CON) 10 MEQ CR tablet Take 10 mEq by mouth daily.       . sildenafil (VIAGRA) 100 MG tablet Take 100 mg by mouth daily as needed for erectile dysfunction.      . simvastatin (ZOCOR) 10 MG tablet Take 10 mg by mouth at bedtime.       . tamsulosin (FLOMAX) 0.4 MG CAPS Take 0.4 mg by mouth daily after supper.       No current facility-administered medications for this visit.    No Known Allergies  History   Social History  . Marital Status: Married    Spouse Name: N/A    Number of Children: 1  . Years of Education: N/A   Occupational History  . Retired    Social History Main Topics  . Smoking status: Former Smoker -- 1.00 packs/day for 20 years    Types: Cigarettes    Quit date: 04/13/1975  . Smokeless tobacco: Never Used  . Alcohol Use: Yes     Comment: drinks liquor 2-3 drinks most days  . Drug Use: No  . Sexually Active: Not on file   Other Topics Concern  . Not on file   Social History Narrative   Lives in [place] with spouse who is s/p spinal cord injury.  He has one grown son who lives in [place].  He is retired but worked for [employer] for years and lives in [place].    Family History  Problem Relation Age of Onset  . Hypertension Mother   Multiple family members with cancer.  Father died in his 20s of a "blood clot"  Denies FH of arrhythmia or sudden death  ROS- All systems are reviewed and negative except as per the HPI above  Physical Exam: Filed Vitals:    06/21/12 1125  BP: 137/68  Pulse: 64  Height: 5\' 7"  (1.702 m)  Weight: 163 lb 12.8 oz (74.299 kg)    GEN- The patient is elderly appearing, alert and oriented x 3 today.   Head- normocephalic, atraumatic Eyes-  Sclera clear, conjunctiva pink Ears- hearing intact Oropharynx- clear Neck- supple, no JVP Lymph- no cervical lymphadenopathy Lungs- Clear to ausculation  bilaterally, normal work of breathing Heart- Regular rate and rhythm, no murmurs, rubs or gallops, + wide S2 split GI- soft, NT, ND, + BS Extremities- no clubbing, cyanosis, or edema MS- age appropriate muscle atrophy Skin- no rash or lesion Psych- euthymic mood, full affect Neuro- strength and sensation are intact  EKG- sinus rhythm 64 bpm, PR 218, LBBB (QRS ) Echo EF 40% Records from [facility] reviewed  Assessment and Plan:

## 2012-06-28 NOTE — OR Nursing (Signed)
Discharge instructions reviewed and signed, pt stated understanding, ambulated in hall without difficulty, site level 0, transported to son's car via wheelchair 

## 2012-06-28 NOTE — OR Nursing (Signed)
Meal served 

## 2012-06-28 NOTE — CV Procedure (Signed)
   Cardiac Catheterization Procedure Note  Name: Scott [name] MRN: [id number] DOB: 1921/06/21  Procedure: Left Heart Cath, Selective Coronary Angiography, LV angiography  Indication: Ventricular tachycardia, cardiomyopathy   Procedural Details: Allen's test was positive.  The right wrist was prepped, draped, and anesthetized with 1% lidocaine. Using the modified Seldinger technique, a 5 French sheath was introduced into the right radial artery. 3 mg of verapamil was administered through the sheath, weight-based unfractionated heparin was administered intravenously. We were unable to navigate into the ascending aorta due to vessel tortuosity.  Therefore, the right femoral area was sterilely prepped and draped and right femoral access was used.  Standard 96F Judkins catheters were used for selective coronary angiography and left ventriculography. Catheter exchanges were performed over an exchange length guidewire. There were no immediate procedural complications. A TR band was used for radial hemostasis at the completion of the procedure.  The patient was transferred to the post catheterization recovery area for further monitoring.  Procedural Findings: Hemodynamics: AO 100/38 LV 95/16  Coronary angiography: Coronary dominance: right  Left mainstem: Mild luminal irregularities.   Left anterior descending (LAD): Mild luminal irregularities.  Left circumflex (LCx): Mild luminal irregularities.   Right coronary artery (RCA): No significant CAD.   Left ventriculography: EF 45-50% with mild global hypokinesis.    Final Conclusions:  Mild luminal irregularities in coronaries.  Mild nonischemic cardiomyopathy.   Marca Ancona 06/28/2012 8:59 AM

## 2012-06-28 NOTE — OR Nursing (Signed)
Dr McLean at bedside to discuss results and treatment plan with pt and family 

## 2012-06-29 DIAGNOSIS — M48061 Spinal stenosis, lumbar region without neurogenic claudication: Secondary | ICD-10-CM | POA: Diagnosis not present

## 2012-06-29 DIAGNOSIS — M25519 Pain in unspecified shoulder: Secondary | ICD-10-CM | POA: Diagnosis not present

## 2012-07-07 ENCOUNTER — Telehealth: Payer: Self-pay | Admitting: Physician Assistant

## 2012-07-07 NOTE — Telephone Encounter (Signed)
Notified patient that monitor should be worn x 21 days.  E-cardio site also has enrollment listed as 06/22/2012 - 07/13/2012.

## 2012-07-07 NOTE — Telephone Encounter (Signed)
Patient called and states he thought he was to wear the event monitor for 30 days, but when he received the monitor he was told to wear it for 18 days.  Please call patient to clarify instructions.

## 2012-07-13 DIAGNOSIS — R3915 Urgency of urination: Secondary | ICD-10-CM | POA: Diagnosis not present

## 2012-07-25 ENCOUNTER — Encounter: Payer: Self-pay | Admitting: Family Medicine

## 2012-07-25 ENCOUNTER — Ambulatory Visit (INDEPENDENT_AMBULATORY_CARE_PROVIDER_SITE_OTHER): Payer: Medicare Other | Admitting: Family Medicine

## 2012-07-25 VITALS — BP 130/71 | HR 68 | Temp 97.9°F | Ht 65.0 in | Wt 158.2 lb

## 2012-07-25 DIAGNOSIS — E785 Hyperlipidemia, unspecified: Secondary | ICD-10-CM

## 2012-07-25 DIAGNOSIS — H6120 Impacted cerumen, unspecified ear: Secondary | ICD-10-CM | POA: Diagnosis not present

## 2012-07-25 DIAGNOSIS — N139 Obstructive and reflux uropathy, unspecified: Secondary | ICD-10-CM

## 2012-07-25 DIAGNOSIS — N138 Other obstructive and reflux uropathy: Secondary | ICD-10-CM

## 2012-07-25 DIAGNOSIS — N401 Enlarged prostate with lower urinary tract symptoms: Secondary | ICD-10-CM

## 2012-07-25 DIAGNOSIS — R5383 Other fatigue: Secondary | ICD-10-CM | POA: Diagnosis not present

## 2012-07-25 DIAGNOSIS — I2510 Atherosclerotic heart disease of native coronary artery without angina pectoris: Secondary | ICD-10-CM

## 2012-07-25 DIAGNOSIS — R5381 Other malaise: Secondary | ICD-10-CM

## 2012-07-25 DIAGNOSIS — I10 Essential (primary) hypertension: Secondary | ICD-10-CM

## 2012-07-25 DIAGNOSIS — R55 Syncope and collapse: Secondary | ICD-10-CM

## 2012-07-25 DIAGNOSIS — H6122 Impacted cerumen, left ear: Secondary | ICD-10-CM

## 2012-07-25 LAB — POCT CBC
Granulocyte percent: 59.6 %G (ref 37–80)
HCT, POC: 40.1 % — AB (ref 43.5–53.7)
Hemoglobin: 13.4 g/dL — AB (ref 14.1–18.1)
Lymph, poc: 1.2 (ref 0.6–3.4)
MCH, POC: 29.8 pg (ref 27–31.2)
MCHC: 33.3 g/dL (ref 31.8–35.4)
MCV: 89.4 fL (ref 80–97)
MPV: 6.6 fL (ref 0–99.8)
POC Granulocyte: 1.9 — AB (ref 2–6.9)
POC LYMPH PERCENT: 36.7 %L (ref 10–50)
Platelet Count, POC: 178 10*3/uL (ref 142–424)
RBC: 4.5 M/uL — AB (ref 4.69–6.13)
RDW, POC: 15 %
WBC: 3.2 10*3/uL — AB (ref 4.6–10.2)

## 2012-07-25 NOTE — Patient Instructions (Addendum)
Use debrox to soften ear wax over the next week, 2-3 drops nightly for 2-3 night, wait one week and repeat this Should go back to physical therapy once Dr. Johney Frame approves for this and per his directions

## 2012-07-25 NOTE — Progress Notes (Signed)
Subjective:    Patient ID: Scott [name], male    DOB: February 28, 1922, 79 y.o.   MRN: [id number]  HPI This patient presents for recheck of multiple medical problems. His son accompanies the patient today.  Patient Active Problem List  Diagnosis  . HTN (hypertension)  . Hyperlipidemia  . PVCs (premature ventricular contractions)  . CAD (coronary artery disease)  . Bladder cancer  . Benign localized hyperplasia of prostate with urinary obstruction  . Syncope    In addition, a summary of the past 3 or 4 months: He had a transurethral resection of the prostate and a bladder biopsy by Dr. Annabell Howells in December 2013. He has a followup visit scheduled with Dr. Lurlean Leyden in May of this year.  In March of 2014 he was [facility] hospital because of syncope. He was monitored closely and had a heart catheterization. He was taken off the Benicar. He has a followup visit with Dr. Johney Frame next week. Since being taken off the Benicar he has had no further syncopal episodes.  The allergies, current medications, past medical history, surgical history, family and social history are reviewed.  Immunizations reviewed.  Health maintenance reviewed.  The following items are outstanding: zostavax      Review of Systems  Constitutional: Positive for appetite change (eating more than usual).  HENT: Positive for postnasal drip.   Eyes: Negative.   Respiratory: Negative.   Cardiovascular: Negative.   Gastrointestinal: Negative.   Endocrine: Negative.   Genitourinary: Positive for frequency, flank pain and decreased urine volume.  Musculoskeletal: Positive for back pain.  Skin: Negative.   Allergic/Immunologic: Negative.   Neurological: Positive for syncope (fainted multiple times).  Hematological: Negative.   Psychiatric/Behavioral: Negative.        Objective:   Physical Exam BP 130/71  Pulse 68  Temp(Src) 97.9 F (36.6 C) (Oral)  Ht 5\' 5"  (1.651 m)  Wt 158 lb 3.2 oz (71.759 kg)  BMI 26.33  kg/m2  The patient appeared well nourished and normally developed, alert and oriented to time and place. Speech, behavior and judgement appear normal. He was somewhat kyphotic in appearance.  Head exam is unremarkable. No scleral icterus or pallor noted. He did have some ear wax in the left ear canal. Some of this was removed with a curette. Neck is without jugular venous distension, thyromegally, or carotid bruits. Carotid upstrokes are brisk bilaterally. No cervical adenopathy. Lungs are clear anteriorly and posteriorly to auscultation. Normal respiratory effort. Cardiac exam reveals regular rate and rhythm. First and second heart sounds normal. No murmurs, rubs or gallops.  Abdominal exam reveals  no masses, no organomegaly and no aortic enlargement. No inguinal adenopathy. Extremities are nonedematous and both femoral and pedal pulses are normal. Skin without pallor or jaundice.  Warm and dry, without rash. Neurologic exam reveals normal deep tendon reflexes and normal sensation.          Assessment & Plan:  1. Fatigue - POCT CBC - BASIC METABOLIC PANEL WITH GFR  2. Hyperlipidemia - Hepatic function panel - Lipid panel  3. Cerumen impaction, left A curette was used to remove some of this  4. CAD (coronary artery disease) Will be followed up by Dr. Johney Frame next week  5. HTN (hypertension) Blood pressure was good today continue to hold off on the Benicar   6. Syncope No more syncope since off Benicar  7. Benign localized hyperplasia of prostate with urinary obstruction To be followed by Dr. Annabell Howells in May  Patient Instructions  Use debrox to soften ear wax over the next week, 2-3 drops nightly for 2-3 night, wait one week and repeat this Should go back to physical therapy once Dr. Johney Frame approves for this and per his directions

## 2012-07-26 LAB — BASIC METABOLIC PANEL WITH GFR
BUN: 18 mg/dL (ref 6–23)
CO2: 29 mEq/L (ref 19–32)
Calcium: 9.8 mg/dL (ref 8.4–10.5)
Chloride: 101 mEq/L (ref 96–112)
Creat: 1.13 mg/dL (ref 0.50–1.35)
GFR, Est African American: 67 mL/min
GFR, Est Non African American: 58 mL/min — ABNORMAL LOW
Glucose, Bld: 94 mg/dL (ref 70–99)
Potassium: 4.1 mEq/L (ref 3.5–5.3)
Sodium: 140 mEq/L (ref 135–145)

## 2012-07-26 LAB — LIPID PANEL
Cholesterol: 87 mg/dL (ref 0–200)
HDL: 38 mg/dL — ABNORMAL LOW (ref >39)
LDL Cholesterol: 14 mg/dL (ref 0–99)
Total CHOL/HDL Ratio: 2.3 Ratio
Triglycerides: 256 mg/dL — ABNORMAL HIGH (ref <150)
VLDL: 51 mg/dL — ABNORMAL HIGH (ref 0–40)

## 2012-07-26 LAB — HEPATIC FUNCTION PANEL
ALT: 19 U/L (ref 0–53)
AST: 43 U/L — ABNORMAL HIGH (ref 0–37)
Albumin: 3.9 g/dL (ref 3.5–5.2)
Alkaline Phosphatase: 82 U/L (ref 39–117)
Bilirubin, Direct: 0.2 mg/dL (ref 0.0–0.3)
Indirect Bilirubin: 0.6 mg/dL (ref 0.0–0.9)
Total Bilirubin: 0.8 mg/dL (ref 0.3–1.2)
Total Protein: 6.4 g/dL (ref 6.0–8.3)

## 2012-07-30 ENCOUNTER — Other Ambulatory Visit: Payer: Self-pay | Admitting: Family Medicine

## 2012-07-31 NOTE — Telephone Encounter (Signed)
RX FILLED

## 2012-08-03 ENCOUNTER — Telehealth: Payer: Self-pay | Admitting: Internal Medicine

## 2012-08-03 NOTE — Telephone Encounter (Signed)
Wants to know if patient can start exercising 2-3 times a week and not wait until next appointment with Dr. Johney Frame on May 14th. Please call Scott [name] his son.

## 2012-08-04 ENCOUNTER — Ambulatory Visit: Payer: Medicare Other | Admitting: Internal Medicine

## 2012-08-04 NOTE — Telephone Encounter (Signed)
Patient returned call to [facility] office.  Advised him that our office did not call him & that I would let person know that left the message.

## 2012-08-04 NOTE — Telephone Encounter (Signed)
lmom for patient's son to return my call

## 2012-08-07 NOTE — Telephone Encounter (Signed)
Follow up   Pt returning your call from Friday.

## 2012-08-07 NOTE — Telephone Encounter (Signed)
His appointment was moved due to Dr Allred's change in schedule.  His monitor is complete and  Dr Johney Frame needs to review.  I have let the son know I will forward to the [place] office for them to pull the monitor so he can review for the patient's son.  His main concern is he feels his Dad's stamina is decreasing because he has not been going to his rehab/exercise program that Dr Christell Constant  set him up for.  We have advised him to start back slow with 2 days a week unless the monitor shows something different

## 2012-08-09 ENCOUNTER — Ambulatory Visit: Payer: Medicare Other | Attending: Family Medicine | Admitting: Physical Therapy

## 2012-08-09 DIAGNOSIS — IMO0001 Reserved for inherently not codable concepts without codable children: Secondary | ICD-10-CM | POA: Insufficient documentation

## 2012-08-09 DIAGNOSIS — M6281 Muscle weakness (generalized): Secondary | ICD-10-CM | POA: Diagnosis not present

## 2012-08-09 DIAGNOSIS — R5381 Other malaise: Secondary | ICD-10-CM | POA: Diagnosis not present

## 2012-08-11 ENCOUNTER — Ambulatory Visit (INDEPENDENT_AMBULATORY_CARE_PROVIDER_SITE_OTHER): Payer: Medicare Other | Admitting: Urology

## 2012-08-11 ENCOUNTER — Ambulatory Visit: Payer: Medicare Other | Attending: Family Medicine | Admitting: *Deleted

## 2012-08-11 DIAGNOSIS — Z8551 Personal history of malignant neoplasm of bladder: Secondary | ICD-10-CM

## 2012-08-11 DIAGNOSIS — IMO0001 Reserved for inherently not codable concepts without codable children: Secondary | ICD-10-CM | POA: Diagnosis not present

## 2012-08-11 DIAGNOSIS — N138 Other obstructive and reflux uropathy: Secondary | ICD-10-CM | POA: Diagnosis not present

## 2012-08-11 DIAGNOSIS — M6281 Muscle weakness (generalized): Secondary | ICD-10-CM | POA: Diagnosis not present

## 2012-08-11 DIAGNOSIS — R5381 Other malaise: Secondary | ICD-10-CM | POA: Insufficient documentation

## 2012-08-11 DIAGNOSIS — R3915 Urgency of urination: Secondary | ICD-10-CM

## 2012-08-11 DIAGNOSIS — N401 Enlarged prostate with lower urinary tract symptoms: Secondary | ICD-10-CM | POA: Diagnosis not present

## 2012-08-14 ENCOUNTER — Ambulatory Visit: Payer: Medicare Other | Admitting: Physical Therapy

## 2012-08-14 DIAGNOSIS — IMO0001 Reserved for inherently not codable concepts without codable children: Secondary | ICD-10-CM | POA: Diagnosis not present

## 2012-08-14 DIAGNOSIS — R5381 Other malaise: Secondary | ICD-10-CM | POA: Diagnosis not present

## 2012-08-14 DIAGNOSIS — M6281 Muscle weakness (generalized): Secondary | ICD-10-CM | POA: Diagnosis not present

## 2012-08-14 NOTE — Telephone Encounter (Signed)
He looked at the monitor and said ok to see him as scheduled Tresa Endo  ----- Message ----- From: Lesle Chris, LPN Sent: 1/61/0960 10:29 AM To: Deliah Boston, RN  5/1 - 11:00 LM  Patient notified today.  Dr. Johney Frame 5/14.

## 2012-08-16 ENCOUNTER — Telehealth: Payer: Self-pay | Admitting: Family Medicine

## 2012-08-16 ENCOUNTER — Ambulatory Visit: Payer: Medicare Other | Admitting: Physical Therapy

## 2012-08-16 DIAGNOSIS — R5381 Other malaise: Secondary | ICD-10-CM | POA: Diagnosis not present

## 2012-08-16 DIAGNOSIS — IMO0001 Reserved for inherently not codable concepts without codable children: Secondary | ICD-10-CM | POA: Diagnosis not present

## 2012-08-16 DIAGNOSIS — M6281 Muscle weakness (generalized): Secondary | ICD-10-CM | POA: Diagnosis not present

## 2012-08-16 NOTE — Telephone Encounter (Signed)
APPT FOR THURS AT 10:30 PER KAY

## 2012-08-17 ENCOUNTER — Ambulatory Visit (INDEPENDENT_AMBULATORY_CARE_PROVIDER_SITE_OTHER): Payer: Medicare Other | Admitting: Family Medicine

## 2012-08-17 ENCOUNTER — Encounter: Payer: Self-pay | Admitting: Family Medicine

## 2012-08-17 VITALS — BP 162/93 | HR 83 | Temp 96.8°F | Wt 154.0 lb

## 2012-08-17 DIAGNOSIS — F29 Unspecified psychosis not due to a substance or known physiological condition: Secondary | ICD-10-CM

## 2012-08-17 DIAGNOSIS — M25559 Pain in unspecified hip: Secondary | ICD-10-CM | POA: Diagnosis not present

## 2012-08-17 DIAGNOSIS — R5383 Other fatigue: Secondary | ICD-10-CM | POA: Diagnosis not present

## 2012-08-17 DIAGNOSIS — R5381 Other malaise: Secondary | ICD-10-CM

## 2012-08-17 DIAGNOSIS — M25551 Pain in right hip: Secondary | ICD-10-CM

## 2012-08-17 DIAGNOSIS — R410 Disorientation, unspecified: Secondary | ICD-10-CM

## 2012-08-17 DIAGNOSIS — R531 Weakness: Secondary | ICD-10-CM

## 2012-08-17 LAB — POCT CBC
Granulocyte percent: 52.6 %G (ref 37–80)
HCT, POC: 39.5 % — AB (ref 43.5–53.7)
Hemoglobin: 12.9 g/dL — AB (ref 14.1–18.1)
Lymph, poc: 1.3 (ref 0.6–3.4)
MCH, POC: 29.8 pg (ref 27–31.2)
MCHC: 32.6 g/dL (ref 31.8–35.4)
MCV: 91.6 fL (ref 80–97)
MPV: 6.5 fL (ref 0–99.8)
POC Granulocyte: 1.6 — AB (ref 2–6.9)
POC LYMPH PERCENT: 41.5 %L (ref 10–50)
Platelet Count, POC: 169 10*3/uL (ref 142–424)
RBC: 4.3 M/uL — AB (ref 4.69–6.13)
RDW, POC: 15.5 %
WBC: 3.1 10*3/uL — AB (ref 4.6–10.2)

## 2012-08-17 LAB — HEPATIC FUNCTION PANEL
ALT: 17 U/L (ref 0–53)
AST: 42 U/L — ABNORMAL HIGH (ref 0–37)
Albumin: 3.7 g/dL (ref 3.5–5.2)
Alkaline Phosphatase: 76 U/L (ref 39–117)
Bilirubin, Direct: 0.2 mg/dL (ref 0.0–0.3)
Indirect Bilirubin: 0.5 mg/dL (ref 0.0–0.9)
Total Bilirubin: 0.7 mg/dL (ref 0.3–1.2)
Total Protein: 6.6 g/dL (ref 6.0–8.3)

## 2012-08-17 LAB — BASIC METABOLIC PANEL WITH GFR
BUN: 14 mg/dL (ref 6–23)
CO2: 26 mEq/L (ref 19–32)
Calcium: 9.5 mg/dL (ref 8.4–10.5)
Chloride: 100 mEq/L (ref 96–112)
Creat: 0.88 mg/dL (ref 0.50–1.35)
GFR, Est African American: 89 mL/min
GFR, Est Non African American: 77 mL/min
Glucose, Bld: 88 mg/dL (ref 70–99)
Potassium: 3.7 mEq/L (ref 3.5–5.3)
Sodium: 137 mEq/L (ref 135–145)

## 2012-08-17 NOTE — Progress Notes (Signed)
  Subjective:    Patient ID: Scott [name], male    DOB: Mar 28, 1922, 79 y.o.   MRN: [id number]  HPI Patient presents today with multiple complaints a lot of which have come from his family regarding him. Decreased energy, decreased appetite, increased dizziness, more stumbling, increased forgetfulness, decreased memory, and slower movement. As of note there is 24-hour home care and no one is aware of him falling any time recently. He admits to some alcohol intake 48 hours . The sitter and his wife car with him today.   Review of Systems  Constitutional: Positive for activity change (decreased) and fatigue (increased). Appetite change: decreased.  Respiratory: Negative for shortness of breath.   Cardiovascular: Negative for chest pain.  Gastrointestinal: Positive for constipation (some).  Neurological: Positive for dizziness and weakness. Negative for headaches.       Objective:   Physical Exam  Nursing note and vitals reviewed. Constitutional: He is oriented to person, place, and time. He appears well-developed.  HENT:  Head: Normocephalic and atraumatic.  Eyes: Right eye exhibits no discharge. Left eye exhibits no discharge. Scleral icterus (questions scleral icterus) is present.  Neck: Normal range of motion. Neck supple. No thyromegaly present.  Cardiovascular: Normal rate, regular rhythm and normal heart sounds.   No murmur heard. Pulmonary/Chest: Effort normal and breath sounds normal. No respiratory distress. He has no wheezes. He has no rales. He exhibits no tenderness.  Abdominal: Soft. He exhibits mass. He exhibits no distension. There is no tenderness. There is no rebound and no guarding.  There is apparently some weight loss  Musculoskeletal: Normal range of motion. He exhibits no edema and no tenderness.  Neurological: He is alert and oriented to person, place, and time.  Memory somewhat impaired but probably normal for age  Skin: Skin is warm and dry.  Psychiatric: His  behavior is normal. Judgment and thought content normal.  Depressed affect          Assessment & Plan:  1. Weakness - POCT CBC - POCT UA - Microscopic Only - POCT urinalysis dipstick - Hepatic function panel - BASIC METABOLIC PANEL WITH GFR  2. Fatigue - POCT CBC - POCT UA - Microscopic Only - POCT urinalysis dipstick - Hepatic function panel - BASIC METABOLIC PANEL WITH GFR  3. Confusion - POCT CBC - POCT UA - Microscopic Only - POCT urinalysis dipstick - Hepatic function panel - BASIC METABOLIC PANEL WITH GFR  4. Right hip pain Continue physical therapy as doing   Patient Instructions  Please don't drink any more alcohol Be careful not to fall Eat regularly and drink plenty of fluids  We will try to get copy of CT scan of the head from last hospital admission. If the symptoms of this visit continue he may need another CT scan. Keep appointment with Dr. Johney Frame as planned

## 2012-08-17 NOTE — Patient Instructions (Signed)
Please don't drink any more alcohol Be careful not to fall Eat regularly and drink plenty of fluids

## 2012-08-21 ENCOUNTER — Ambulatory Visit: Payer: Medicare Other | Admitting: Physical Therapy

## 2012-08-21 DIAGNOSIS — M6281 Muscle weakness (generalized): Secondary | ICD-10-CM | POA: Diagnosis not present

## 2012-08-21 DIAGNOSIS — R5381 Other malaise: Secondary | ICD-10-CM | POA: Diagnosis not present

## 2012-08-21 DIAGNOSIS — IMO0001 Reserved for inherently not codable concepts without codable children: Secondary | ICD-10-CM | POA: Diagnosis not present

## 2012-08-23 ENCOUNTER — Ambulatory Visit: Payer: Medicare Other | Admitting: Physical Therapy

## 2012-08-23 ENCOUNTER — Ambulatory Visit (INDEPENDENT_AMBULATORY_CARE_PROVIDER_SITE_OTHER): Payer: Medicare Other | Admitting: Internal Medicine

## 2012-08-23 ENCOUNTER — Encounter: Payer: Self-pay | Admitting: Internal Medicine

## 2012-08-23 VITALS — BP 134/70 | HR 71 | Ht 67.0 in | Wt 157.1 lb

## 2012-08-23 DIAGNOSIS — N139 Obstructive and reflux uropathy, unspecified: Secondary | ICD-10-CM

## 2012-08-23 DIAGNOSIS — IMO0001 Reserved for inherently not codable concepts without codable children: Secondary | ICD-10-CM | POA: Diagnosis not present

## 2012-08-23 DIAGNOSIS — M6281 Muscle weakness (generalized): Secondary | ICD-10-CM | POA: Diagnosis not present

## 2012-08-23 DIAGNOSIS — N401 Enlarged prostate with lower urinary tract symptoms: Secondary | ICD-10-CM

## 2012-08-23 DIAGNOSIS — R55 Syncope and collapse: Secondary | ICD-10-CM

## 2012-08-23 DIAGNOSIS — N138 Other obstructive and reflux uropathy: Secondary | ICD-10-CM

## 2012-08-23 DIAGNOSIS — I10 Essential (primary) hypertension: Secondary | ICD-10-CM

## 2012-08-23 DIAGNOSIS — R5381 Other malaise: Secondary | ICD-10-CM | POA: Diagnosis not present

## 2012-08-23 DIAGNOSIS — F1010 Alcohol abuse, uncomplicated: Secondary | ICD-10-CM

## 2012-08-23 NOTE — Patient Instructions (Signed)
Continue all current medications. Dr. Shirlee Latch - 6 months [place]red - as needed

## 2012-08-27 DIAGNOSIS — F1010 Alcohol abuse, uncomplicated: Secondary | ICD-10-CM | POA: Insufficient documentation

## 2012-08-27 NOTE — Assessment & Plan Note (Addendum)
Prior postural syncope resolved off of hctz.   He remains on multiple medicines which could contribute to this including norvasc, tamsulosin, and metoprolol.  If he has further symptoms of postural hypotension, then I would advised that he stop tamsulosin.  He is reluctant to stop this medicine today. Echo, cath, and event monitor are reviewed and are low risk.  I do not think that further EP evaluation is necessary at this time.  I have again strongly advised that the patient and his family make other arrangements for driving. I will see him as needed in the future

## 2012-08-27 NOTE — Assessment & Plan Note (Signed)
Per recent update to HTN guidelines (JNC 8), his goal BP should be <150/90.   I would be reluctant to try to control his BP any more aggressively at this time.

## 2012-08-27 NOTE — Assessment & Plan Note (Signed)
The patients son feels that he is occasionally sluggish and confused.  I suspect that this is related to ETOH.  Given his advanced age, there is not question that ETOH consumption poses unnecessary risks to the patient.  I have strongly advised that he quit.

## 2012-08-27 NOTE — Progress Notes (Signed)
PCP:  [name], MD Primary Cardiologist:  Dr Shirlee Latch  The patient presents today for routine electrophysiology followup.  Since last being seen in our clinic, the patient reports doing very well. He has had no further postural dizziness, presyncope, or syncope since stopping hctz.  He is frequently unsteady however.  His son notes that the patient continues to drink alcohol heavily.  The patient and his son report that they did not adhere to driving restrictions because this "did not fit into their family's schedule".  I have again today advised that with his advanced age that he should not drive and that the family make other arrangements for driving.  Today, he denies symptoms of palpitations, chest pain, shortness of breath, orthopnea, PND, lower extremity edema, or neurologic sequela.  The patient feels that he is tolerating medications without difficulties and is otherwise without complaint today.   Past Medical History  Diagnosis Date  . HTN (hypertension)   . Hyperlipidemia   . PVCs (premature ventricular contractions)   . Glaucoma(365)   . Bladder tumor   . CAD (coronary artery disease) CARDIOLOGIST- DR HOCHRIEN -- LAST VIIST [id number] IN EPIC    Non obstructive 25% LAD 2006  . DDD (degenerative disc disease)   . Arthritis   . PVD (peripheral vascular disease)     Right ABI .75, Left .78 (2006)  . Popliteal artery aneurysm, bilateral DECUMENTED CHRONIC PARTIAL OCCLUSION--  PT DENIES CLAUDICATION OR ANY OTHER SYMPTOMS  . Frequency of urination   . Nocturia   . Bladder cancer 03/15/2012   Past Surgical History  Procedure Laterality Date  . Transthoracic echocardiogram  09-22-2010    MODERATE LVH/ EF 55%/ GRADE I DIASTOLIC DYSFUNCTION/ AORTIC SCLEROSIS WITHOUT STENOSIS/  RV  SYSTOLIC  MILDLY REDUCED FUNCTION  . Cardiovascular stress test  10-15-2010    LOW RISK NUCLEAR STUDY/ NO EVIDENCE OF ISCHEMIA/ NORMAL EF  . Cardiac catheterization  05-11-2004  DR [facility]    MINIMAL  CORANARY PLAQUE/ NORMAL LVF/ EF 55%/  NON-OBSTRUCTIVE LAD 25%  . Shoulder surgery  20 YRS AGO  (APPROX. 1990'S)  . Transurethral resection of bladder tumor  10/07/2011    Procedure: TRANSURETHRAL RESECTION OF BLADDER TUMOR (TURBT);  Surgeon: Anner Crete, MD;  Location[facility];  Service: Urology;  Laterality: N/A;  . Cystoscopy  10/07/2011    Procedure: CYSTOSCOPY;  Surgeon: Anner Crete, MD;  Location: [facility];  Service: Urology;  Laterality: N/A;  . Cystoscopy with biopsy  03/14/2012    Procedure: CYSTOSCOPY WITH BIOPSY;  Surgeon: Anner Crete, MD;  Location: [facility];  Service: Urology;  Laterality: N/A;  WITH FULGURATION  . Transurethral resection of prostate  03/14/2012    Procedure: TRANSURETHRAL RESECTION OF THE PROSTATE WITH GYRUS INSTRUMENTS;  Surgeon: Anner Crete, MD;  Location: [facility];  Service: Urology;  Laterality: N/A;    Current Outpatient Prescriptions  Medication Sig Dispense Refill  . amLODipine (NORVASC) 5 MG tablet Take 5 mg by mouth daily.      Marland Kitchen aspirin 81 MG tablet Take 81 mg by mouth daily.       . bimatoprost (LUMIGAN) 0.03 % ophthalmic solution Place 1 drop into the right eye at bedtime.      . fenofibrate 160 MG tablet Take 160 mg by mouth daily.      Marland Kitchen KLOR-CON 10 10 MEQ tablet TAKE 1 TABLET BY MOUTH DAILY  30 tablet  2  . metoprolol (TOPROL-XL) 100  MG 24 hr tablet Take 50 mg by mouth every morning.       . niacin (NIASPAN) 1000 MG CR tablet Take 1,000 mg by mouth at bedtime.       Marland Kitchen olmesartan-hydrochlorothiazide (BENICAR HCT) 20-12.5 MG per tablet Take 1 tablet by mouth daily.       Marland Kitchen oxybutynin (DITROPAN-XL) 5 MG 24 hr tablet Take 5 mg by mouth daily.       . simvastatin (ZOCOR) 10 MG tablet Take 10 mg by mouth at bedtime.       . tamsulosin (FLOMAX) 0.4 MG CAPS Take 0.4 mg by mouth daily after supper.       No current facility-administered medications for this visit.    Allergies  Allergen Reactions  . Lipitor  (Atorvastatin)     History   Social History  . Marital Status: Married    Spouse Name: N/A    Number of Children: 1  . Years of Education: N/A   Occupational History  . Retired    Social History Main Topics  . Smoking status: Former Smoker -- 1.00 packs/day for 20 years    Types: Cigarettes    Start date: 04/12/1942    Quit date: 04/13/1975  . Smokeless tobacco: Never Used  . Alcohol Use: Yes     Comment: drinks liquor 2-3 drinks most days  . Drug Use: No  . Sexually Active: Not on file   Other Topics Concern  . Not on file   Social History Narrative   Lives in [place] with spouse who is s/p spinal cord injury.  He has one grown son who lives in [place].  He is retired but worked for [employer] for years and lives in [place].    Family History  Problem Relation Age of Onset  . Hypertension Mother     ROS-  All systems are reviewed and are negative except as outlined in the HPI above  Physical Exam: Filed Vitals:   08/23/12 1037  BP: 134/70  Pulse: 71  Height: 5\' 7"  (1.702 m)  Weight: 157 lb 1.3 oz (71.251 kg)    GEN- The patient is elderly appearing, alert and oriented x 3 today.   Head- normocephalic, atraumatic Eyes-  Sclera clear, conjunctiva pink Ears- hearing intact Oropharynx- clear Neck- supple,  Lungs- Clear to ausculation bilaterally, normal work of breathing Heart- Regular rate and rhythm  GI- soft, NT, ND, + BS Extremities- no clubbing, cyanosis, trace edema MS- age appropriate atrophy Skin- no rash or lesion Psych- euthymic mood, full affect Neuro- strength and sensation are intact  Echo, cath, event monitor are all reviewed today  Assessment and Plan:

## 2012-08-27 NOTE — Assessment & Plan Note (Signed)
Finasteride may be a better option than tamsulosin given prior postural symptoms.  I have encouraged him to speak with his primary care physician about this in the future.

## 2012-08-28 ENCOUNTER — Ambulatory Visit: Payer: Medicare Other | Admitting: Physical Therapy

## 2012-08-28 DIAGNOSIS — IMO0001 Reserved for inherently not codable concepts without codable children: Secondary | ICD-10-CM | POA: Diagnosis not present

## 2012-08-28 DIAGNOSIS — R5381 Other malaise: Secondary | ICD-10-CM | POA: Diagnosis not present

## 2012-08-28 DIAGNOSIS — M6281 Muscle weakness (generalized): Secondary | ICD-10-CM | POA: Diagnosis not present

## 2012-08-30 ENCOUNTER — Ambulatory Visit: Payer: Medicare Other | Admitting: Physical Therapy

## 2012-08-30 DIAGNOSIS — R5381 Other malaise: Secondary | ICD-10-CM | POA: Diagnosis not present

## 2012-08-30 DIAGNOSIS — M6281 Muscle weakness (generalized): Secondary | ICD-10-CM | POA: Diagnosis not present

## 2012-08-30 DIAGNOSIS — IMO0001 Reserved for inherently not codable concepts without codable children: Secondary | ICD-10-CM | POA: Diagnosis not present

## 2012-09-05 ENCOUNTER — Ambulatory Visit: Payer: Medicare Other | Admitting: *Deleted

## 2012-09-05 DIAGNOSIS — R5381 Other malaise: Secondary | ICD-10-CM | POA: Diagnosis not present

## 2012-09-05 DIAGNOSIS — IMO0001 Reserved for inherently not codable concepts without codable children: Secondary | ICD-10-CM | POA: Diagnosis not present

## 2012-09-05 DIAGNOSIS — M6281 Muscle weakness (generalized): Secondary | ICD-10-CM | POA: Diagnosis not present

## 2012-09-06 ENCOUNTER — Ambulatory Visit: Payer: Medicare Other | Admitting: Physical Therapy

## 2012-09-06 DIAGNOSIS — M6281 Muscle weakness (generalized): Secondary | ICD-10-CM | POA: Diagnosis not present

## 2012-09-06 DIAGNOSIS — IMO0001 Reserved for inherently not codable concepts without codable children: Secondary | ICD-10-CM | POA: Diagnosis not present

## 2012-09-06 DIAGNOSIS — R5381 Other malaise: Secondary | ICD-10-CM | POA: Diagnosis not present

## 2012-09-11 ENCOUNTER — Ambulatory Visit: Payer: Medicare Other | Attending: Family Medicine | Admitting: *Deleted

## 2012-09-11 DIAGNOSIS — IMO0001 Reserved for inherently not codable concepts without codable children: Secondary | ICD-10-CM | POA: Diagnosis not present

## 2012-09-11 DIAGNOSIS — M6281 Muscle weakness (generalized): Secondary | ICD-10-CM | POA: Insufficient documentation

## 2012-09-11 DIAGNOSIS — R5381 Other malaise: Secondary | ICD-10-CM | POA: Insufficient documentation

## 2012-09-13 ENCOUNTER — Encounter: Payer: Medicare Other | Admitting: *Deleted

## 2012-09-14 ENCOUNTER — Ambulatory Visit: Payer: Medicare Other | Admitting: Physical Therapy

## 2012-09-18 ENCOUNTER — Ambulatory Visit: Payer: Medicare Other | Admitting: Physical Therapy

## 2012-09-20 ENCOUNTER — Encounter: Payer: Medicare Other | Admitting: Physical Therapy

## 2012-09-22 ENCOUNTER — Ambulatory Visit: Payer: Medicare Other | Admitting: Physical Therapy

## 2012-09-25 ENCOUNTER — Ambulatory Visit: Payer: Medicare Other | Admitting: *Deleted

## 2012-09-27 ENCOUNTER — Ambulatory Visit: Payer: Medicare Other | Admitting: Physical Therapy

## 2012-09-28 ENCOUNTER — Ambulatory Visit: Payer: Medicare Other

## 2012-10-02 ENCOUNTER — Ambulatory Visit: Payer: Medicare Other | Admitting: Physical Therapy

## 2012-10-04 ENCOUNTER — Ambulatory Visit: Payer: Medicare Other | Admitting: Physical Therapy

## 2012-10-06 ENCOUNTER — Ambulatory Visit: Payer: Medicare Other | Admitting: Physical Therapy

## 2012-10-09 ENCOUNTER — Ambulatory Visit: Payer: Medicare Other | Admitting: Physical Therapy

## 2012-10-11 ENCOUNTER — Ambulatory Visit: Payer: Medicare Other | Attending: Family Medicine | Admitting: Physical Therapy

## 2012-10-11 DIAGNOSIS — R5381 Other malaise: Secondary | ICD-10-CM | POA: Insufficient documentation

## 2012-10-11 DIAGNOSIS — IMO0001 Reserved for inherently not codable concepts without codable children: Secondary | ICD-10-CM | POA: Diagnosis not present

## 2012-10-11 DIAGNOSIS — M6281 Muscle weakness (generalized): Secondary | ICD-10-CM | POA: Diagnosis not present

## 2012-10-16 ENCOUNTER — Ambulatory Visit: Payer: Medicare Other | Admitting: Physical Therapy

## 2012-10-16 DIAGNOSIS — M48061 Spinal stenosis, lumbar region without neurogenic claudication: Secondary | ICD-10-CM | POA: Diagnosis not present

## 2012-10-16 DIAGNOSIS — M25519 Pain in unspecified shoulder: Secondary | ICD-10-CM | POA: Diagnosis not present

## 2012-10-18 ENCOUNTER — Ambulatory Visit: Payer: Medicare Other

## 2012-10-20 ENCOUNTER — Ambulatory Visit: Payer: Medicare Other | Admitting: Physical Therapy

## 2012-10-23 ENCOUNTER — Ambulatory Visit: Payer: Medicare Other | Admitting: *Deleted

## 2012-10-24 ENCOUNTER — Other Ambulatory Visit: Payer: Self-pay | Admitting: Family Medicine

## 2012-10-25 ENCOUNTER — Ambulatory Visit: Payer: Medicare Other | Admitting: Physical Therapy

## 2012-10-27 ENCOUNTER — Ambulatory Visit: Payer: Medicare Other | Admitting: Physical Therapy

## 2012-11-02 ENCOUNTER — Encounter: Payer: Self-pay | Admitting: Family Medicine

## 2012-11-02 ENCOUNTER — Ambulatory Visit (INDEPENDENT_AMBULATORY_CARE_PROVIDER_SITE_OTHER): Payer: Medicare Other | Admitting: Family Medicine

## 2012-11-02 VITALS — BP 174/89 | HR 91 | Temp 99.3°F | Ht 65.0 in | Wt 157.3 lb

## 2012-11-02 DIAGNOSIS — R5381 Other malaise: Secondary | ICD-10-CM

## 2012-11-02 DIAGNOSIS — R5383 Other fatigue: Secondary | ICD-10-CM | POA: Insufficient documentation

## 2012-11-02 DIAGNOSIS — K5900 Constipation, unspecified: Secondary | ICD-10-CM

## 2012-11-02 DIAGNOSIS — E559 Vitamin D deficiency, unspecified: Secondary | ICD-10-CM

## 2012-11-02 DIAGNOSIS — E785 Hyperlipidemia, unspecified: Secondary | ICD-10-CM

## 2012-11-02 DIAGNOSIS — I10 Essential (primary) hypertension: Secondary | ICD-10-CM | POA: Diagnosis not present

## 2012-11-02 DIAGNOSIS — R413 Other amnesia: Secondary | ICD-10-CM

## 2012-11-02 DIAGNOSIS — I2510 Atherosclerotic heart disease of native coronary artery without angina pectoris: Secondary | ICD-10-CM

## 2012-11-02 LAB — POCT CBC
Granulocyte percent: 72 %G (ref 37–80)
HCT, POC: 36.5 % — AB (ref 43.5–53.7)
Hemoglobin: 12 g/dL — AB (ref 14.1–18.1)
Lymph, poc: 0.8 (ref 0.6–3.4)
MCH, POC: 31.8 pg — AB (ref 27–31.2)
MCHC: 32.9 g/dL (ref 31.8–35.4)
MCV: 96.7 fL (ref 80–97)
MPV: 7.8 fL (ref 0–99.8)
POC Granulocyte: 2.6 (ref 2–6.9)
POC LYMPH PERCENT: 22 %L (ref 10–50)
Platelet Count, POC: 215 10*3/uL (ref 142–424)
RBC: 3.8 M/uL — AB (ref 4.69–6.13)
RDW, POC: 17.4 %
WBC: 3.6 10*3/uL — AB (ref 4.6–10.2)

## 2012-11-02 LAB — POCT URINALYSIS DIPSTICK
Bilirubin, UA: NEGATIVE
Glucose, UA: NEGATIVE
Ketones, UA: NEGATIVE
Leukocytes, UA: NEGATIVE
Nitrite, UA: NEGATIVE
Spec Grav, UA: 1.015
Urobilinogen, UA: NEGATIVE
pH, UA: 7

## 2012-11-02 LAB — POCT UA - MICROSCOPIC ONLY
Crystals, Ur, HPF, POC: NEGATIVE
Yeast, UA: NEGATIVE

## 2012-11-02 MED ORDER — DONEPEZIL HCL 5 MG PO TBDP: 5.0000 mg | ORAL_TABLET | Freq: Every day | ORAL | Status: DC

## 2012-11-02 MED ORDER — MEGESTROL ACETATE 20 MG PO TABS: 20.0000 mg | ORAL_TABLET | Freq: Two times a day (BID) | ORAL | Status: DC

## 2012-11-02 NOTE — Progress Notes (Signed)
Subjective:    Patient ID: Scott [name], male    DOB: 11-12-1921, 79 y.o.   MRN: [id number]  HPI Patient returns to clinic today for followup of chronic medical problems and their management. These include hypertension hyperlipidemia and back pain. He also has some issues with dealing with a recent loss of her sister and being a caregiver for his disabled wife at home. This has played a role with more stress some alcohol use and in ability to get out and have some normal activity in his life. He also has a history of coronary artery disease. Patient is accompanied by his son today.   Review of Systems  Constitutional: Positive for appetite change (decreased) and fatigue.  HENT: Positive for postnasal drip. Negative for ear pain and congestion. Rhinorrhea: slight.   Eyes: Negative.   Respiratory: Positive for shortness of breath (with exertion). Negative for cough.   Cardiovascular: Positive for leg swelling. Negative for chest pain and palpitations.  Gastrointestinal: Positive for constipation (2-3 x week). Negative for abdominal pain.  Genitourinary: Negative for dysuria and frequency.  Musculoskeletal: Positive for back pain (LBP) and arthralgias (R shoulder).  Allergic/Immunologic: Positive for environmental allergies.  Neurological: Negative for dizziness and headaches.  Psychiatric/Behavioral: Positive for sleep disturbance, decreased concentration (memory loss) and agitation.       Objective:   Physical Exam BP 174/89  Pulse 91  Temp(Src) 99.3 F (37.4 C) (Oral)  Ht 5\' 5"  (1.651 m)  Wt 157 lb 4.8 oz (71.351 kg)  BMI 26.18 kg/m2 Repeat blood pressure right arm sitting was 160/88   The patient appeared kyphotic and somewhat frail, but alert and oriented to time and place. Speech, behavior and judgement appear normal. Vital signs as documented.  Head exam is unremarkable. No scleral icterus or pallor noted. He had bilateral ear cerumen. Mouth and throat and nasal passages were  within normal limit. Neck is without jugular venous distension, thyromegally, or carotid bruits. Carotid upstrokes are brisk bilaterally. No cervical adenopathy. Lungs are clear anteriorly and posteriorly to auscultation. Normal respiratory effort. Cardiac exam reveals regular rate and rhythm at 72 per minute. First and second heart sounds normal.  No murmurs, rubs or gallops.  Abdominal exam reveals normal bowl sounds, no masses, no organomegaly and no aortic enlargement. No inguinal adenopathy. There is no abdominal tenderness. The abdomen appeared to have a lot of gas. Extremities are nonedematous and both femoral  pulses are normal. Skin without pallor or jaundice.  Warm and dry, without rash. He did have several areas of excoriation on his legs and trunk. Neurologic exam reveals normal deep tendon reflexes in the lower extremities but diminished in the upper extremity, and normal sensation.           Assessment & Plan:  1. HTN (hypertension) - POCT CBC; Standing - BASIC METABOLIC PANEL WITH GFR; Standing - POCT CBC - BASIC METABOLIC PANEL WITH GFR  2. CAD (coronary artery disease)  3. Hyperlipidemia - Hepatic function panel; Standing - Lipid panel; Standing - Hepatic function panel - Lipid panel  4. Fatigue - POCT CBC; Standing - POCT urinalysis dipstick - POCT UA - Microscopic Only - POCT CBC  5. Constipation - POCT CBC; Standing - POCT CBC  6. Vitamin D deficiency - Vitamin D 25 hydroxy; Standing - Vitamin D 25 hydroxy  7. Memory deficit -Aricept 5 mg daily, discontinue if any stomach irritation  Patient Instructions  Fall precautions discussed Continue current meds and therapeutic lifestyle changes Monitor blood pressures  once or twice daily at home Bring readings for review in 2 week Try to leave off the alcohol Try to take less narcotic medication Start new medications and appetite stimulant as directed We will try to arrange for some behavioral  health treatment Keep appointment with orthopedist and pain management    Nyra Capes MD

## 2012-11-02 NOTE — Patient Instructions (Addendum)
Fall precautions discussed Continue current meds and therapeutic lifestyle changes Monitor blood pressures once or twice daily at home Bring readings for review in 2 week Try to leave off the alcohol Try to take less narcotic medication Start new medications and appetite stimulant as directed We will try to arrange for some behavioral health treatment Keep appointment with orthopedist and pain management

## 2012-11-03 LAB — HEPATIC FUNCTION PANEL
ALT: 11 U/L (ref 0–53)
AST: 23 U/L (ref 0–37)
Albumin: 3.7 g/dL (ref 3.5–5.2)
Alkaline Phosphatase: 56 U/L (ref 39–117)
Bilirubin, Direct: 0.1 mg/dL (ref 0.0–0.3)
Indirect Bilirubin: 0.4 mg/dL (ref 0.0–0.9)
Total Bilirubin: 0.5 mg/dL (ref 0.3–1.2)
Total Protein: 6.4 g/dL (ref 6.0–8.3)

## 2012-11-03 LAB — LIPID PANEL
Cholesterol: 132 mg/dL (ref 0–200)
HDL: 50 mg/dL (ref >39)
LDL Cholesterol: 38 mg/dL (ref 0–99)
Total CHOL/HDL Ratio: 2.6 Ratio
Triglycerides: 218 mg/dL — ABNORMAL HIGH (ref <150)
VLDL: 44 mg/dL — ABNORMAL HIGH (ref 0–40)

## 2012-11-03 LAB — BASIC METABOLIC PANEL WITH GFR
BUN: 15 mg/dL (ref 6–23)
CO2: 28 mEq/L (ref 19–32)
Calcium: 9.5 mg/dL (ref 8.4–10.5)
Chloride: 106 mEq/L (ref 96–112)
Creat: 0.91 mg/dL (ref 0.50–1.35)
GFR, Est African American: 86 mL/min
GFR, Est Non African American: 74 mL/min
Glucose, Bld: 75 mg/dL (ref 70–99)
Potassium: 3.9 mEq/L (ref 3.5–5.3)
Sodium: 144 mEq/L (ref 135–145)

## 2012-11-03 LAB — VITAMIN D 25 HYDROXY (VIT D DEFICIENCY, FRACTURES): Vit D, 25-Hydroxy: 42 ng/mL (ref 30–89)

## 2012-11-15 ENCOUNTER — Ambulatory Visit: Payer: Medicare Other | Admitting: Neurology

## 2012-11-21 ENCOUNTER — Ambulatory Visit: Payer: Medicare Other | Admitting: Neurology

## 2012-11-21 DIAGNOSIS — M79609 Pain in unspecified limb: Secondary | ICD-10-CM | POA: Diagnosis not present

## 2012-11-21 DIAGNOSIS — M25519 Pain in unspecified shoulder: Secondary | ICD-10-CM | POA: Diagnosis not present

## 2012-11-24 ENCOUNTER — Ambulatory Visit: Payer: Medicare Other | Admitting: Urology

## 2012-12-01 ENCOUNTER — Ambulatory Visit (INDEPENDENT_AMBULATORY_CARE_PROVIDER_SITE_OTHER): Payer: Medicare Other | Admitting: Urology

## 2012-12-01 DIAGNOSIS — N401 Enlarged prostate with lower urinary tract symptoms: Secondary | ICD-10-CM

## 2012-12-01 DIAGNOSIS — R3915 Urgency of urination: Secondary | ICD-10-CM

## 2012-12-01 DIAGNOSIS — N138 Other obstructive and reflux uropathy: Secondary | ICD-10-CM | POA: Diagnosis not present

## 2012-12-01 DIAGNOSIS — Z8551 Personal history of malignant neoplasm of bladder: Secondary | ICD-10-CM

## 2012-12-12 DIAGNOSIS — L851 Acquired keratosis [keratoderma] palmaris et plantaris: Secondary | ICD-10-CM | POA: Diagnosis not present

## 2012-12-12 DIAGNOSIS — I70209 Unspecified atherosclerosis of native arteries of extremities, unspecified extremity: Secondary | ICD-10-CM | POA: Diagnosis not present

## 2012-12-12 DIAGNOSIS — B351 Tinea unguium: Secondary | ICD-10-CM | POA: Diagnosis not present

## 2012-12-22 DIAGNOSIS — M47812 Spondylosis without myelopathy or radiculopathy, cervical region: Secondary | ICD-10-CM | POA: Diagnosis not present

## 2012-12-22 DIAGNOSIS — M25519 Pain in unspecified shoulder: Secondary | ICD-10-CM | POA: Diagnosis not present

## 2012-12-22 DIAGNOSIS — M48061 Spinal stenosis, lumbar region without neurogenic claudication: Secondary | ICD-10-CM | POA: Diagnosis not present

## 2013-01-05 ENCOUNTER — Other Ambulatory Visit: Payer: Self-pay | Admitting: Family Medicine

## 2013-01-18 ENCOUNTER — Other Ambulatory Visit: Payer: Self-pay | Admitting: Family Medicine

## 2013-01-19 DIAGNOSIS — M48061 Spinal stenosis, lumbar region without neurogenic claudication: Secondary | ICD-10-CM | POA: Diagnosis not present

## 2013-02-05 ENCOUNTER — Ambulatory Visit: Payer: Medicare Other | Admitting: Family Medicine

## 2013-02-12 ENCOUNTER — Encounter: Payer: Self-pay | Admitting: Family Medicine

## 2013-02-12 ENCOUNTER — Ambulatory Visit (INDEPENDENT_AMBULATORY_CARE_PROVIDER_SITE_OTHER): Payer: Medicare Other | Admitting: Family Medicine

## 2013-02-12 VITALS — BP 160/96 | HR 78 | Temp 99.8°F | Ht 65.0 in | Wt 155.0 lb

## 2013-02-12 DIAGNOSIS — I2510 Atherosclerotic heart disease of native coronary artery without angina pectoris: Secondary | ICD-10-CM | POA: Diagnosis not present

## 2013-02-12 DIAGNOSIS — E785 Hyperlipidemia, unspecified: Secondary | ICD-10-CM | POA: Diagnosis not present

## 2013-02-12 DIAGNOSIS — E559 Vitamin D deficiency, unspecified: Secondary | ICD-10-CM

## 2013-02-12 DIAGNOSIS — N401 Enlarged prostate with lower urinary tract symptoms: Secondary | ICD-10-CM | POA: Diagnosis not present

## 2013-02-12 DIAGNOSIS — N138 Other obstructive and reflux uropathy: Secondary | ICD-10-CM

## 2013-02-12 DIAGNOSIS — Z23 Encounter for immunization: Secondary | ICD-10-CM

## 2013-02-12 DIAGNOSIS — N139 Obstructive and reflux uropathy, unspecified: Secondary | ICD-10-CM

## 2013-02-12 DIAGNOSIS — I10 Essential (primary) hypertension: Secondary | ICD-10-CM

## 2013-02-12 DIAGNOSIS — M2550 Pain in unspecified joint: Secondary | ICD-10-CM | POA: Diagnosis not present

## 2013-02-12 DIAGNOSIS — Z8551 Personal history of malignant neoplasm of bladder: Secondary | ICD-10-CM

## 2013-02-12 LAB — POCT CBC
Granulocyte percent: 64.7 %G (ref 37–80)
HCT, POC: 41.1 % — AB (ref 43.5–53.7)
Hemoglobin: 13.2 g/dL — AB (ref 14.1–18.1)
Lymph, poc: 0.9 (ref 0.6–3.4)
MCH, POC: 28.8 pg (ref 27–31.2)
MCHC: 32.2 g/dL (ref 31.8–35.4)
MCV: 89.4 fL (ref 80–97)
MPV: 7.8 fL (ref 0–99.8)
POC Granulocyte: 1.9 — AB (ref 2–6.9)
POC LYMPH PERCENT: 29.4 %L (ref 10–50)
Platelet Count, POC: 170 10*3/uL (ref 142–424)
RBC: 4.6 M/uL — AB (ref 4.69–6.13)
RDW, POC: 16.1 %
WBC: 3 10*3/uL — AB (ref 4.6–10.2)

## 2013-02-12 NOTE — Addendum Note (Signed)
Addended by: Orma Render F on: 02/12/2013 02:52 PM   Modules accepted: Orders

## 2013-02-12 NOTE — Patient Instructions (Addendum)
Continue current medications. Continue good therapeutic lifestyle changes.  Fall precautions discussed with patient. Follow up as planned and earlier as needed.  Take Tylenol as needed for joint aches and pain We will give you a flu shot today You will need to return to clinic in the next 3-4 weeks and did a Prevnar shot, which is an additional pneumonia shot Stay as active as possible This winter drink plenty of fluids Keep followup appointment with the urologist Lab work was drawn today and we will call you with the results as soon as they are available

## 2013-02-12 NOTE — Progress Notes (Signed)
Subjective:    Patient ID: Scott [name], male    DOB: 02-11-1922, 79 y.o.   MRN: [id number]  HPI Pt here for follow up and management of chronic medical problems. Patient is followed by the urologist periodically.     Patient Active Problem List   Diagnosis Date Noted  . Fatigue 11/02/2012  . Constipation 11/02/2012  . Alcohol abuse 08/27/2012  . Syncope 06/21/2012  . Bladder cancer 03/15/2012  . Benign localized hyperplasia of prostate with urinary obstruction 03/15/2012  . HTN (hypertension)   . Hyperlipidemia   . PVCs (premature ventricular contractions)   . CAD (coronary artery disease)    Outpatient Encounter Prescriptions as of 02/12/2013  Medication Sig  . amLODipine (NORVASC) 5 MG tablet Take 5 mg by mouth daily.  Marland Kitchen aspirin 81 MG tablet Take 81 mg by mouth daily.   . fenofibrate 160 MG tablet TAKE 1 TABLET DAILY  . HYDROcodone-acetaminophen (NORCO) 10-325 MG per tablet   . KLOR-CON 10 10 MEQ tablet TAKE 1 TABLET BY MOUTH EVERY DAY  . LUMIGAN 0.01 % SOLN INSTILL 1 DROP IN [facility] EYE AT BEDTIME  . metoprolol (TOPROL-XL) 100 MG 24 hr tablet Take 50 mg by mouth every morning.   . niacin (NIASPAN) 1000 MG CR tablet TAKE 1 TABLET DAILY  . olmesartan-hydrochlorothiazide (BENICAR HCT) 20-12.5 MG per tablet Take 1 tablet by mouth daily.   Marland Kitchen oxybutynin (DITROPAN-XL) 5 MG 24 hr tablet TAKE 1 TABLET DAILY  . simvastatin (ZOCOR) 10 MG tablet TAKE 1 TABLET DAILY  . tamsulosin (FLOMAX) 0.4 MG CAPS capsule TAKE 1 CAPSULE DAILY  . donepezil (ARICEPT ODT) 10 MG disintegrating tablet TAKE 1/2 TABLET DAILY AT BEDTIME  . [DISCONTINUED] bimatoprost (LUMIGAN) 0.03 % ophthalmic solution Place 1 drop into the right eye at bedtime.  . [DISCONTINUED] megestrol (MEGACE) 20 MG tablet Take 1 tablet (20 mg total) by mouth 2 (two) times daily.    Review of Systems  Constitutional: Positive for appetite change (decrease).  HENT: Negative.   Eyes: Negative.   Respiratory: Negative.    Cardiovascular: Negative.   Gastrointestinal: Negative.   Endocrine: Negative.   Genitourinary: Negative.   Musculoskeletal: Positive for arthralgias (bilateral foot pain and right wrist).  Skin: Negative.   Allergic/Immunologic: Negative.   Neurological: Negative.   Hematological: Negative.   Psychiatric/Behavioral: Negative.        Memory issues       Objective:   Physical Exam  Nursing note and vitals reviewed. Constitutional: He is oriented to person, place, and time. No distress.  Elderly and kyphotic  HENT:  Head: Normocephalic and atraumatic.  Right Ear: External ear normal.  Left Ear: External ear normal.  Nose: Nose normal.  Mouth/Throat: Oropharynx is clear and moist. No oropharyngeal exudate.  .There is some ear cerumen bilaterally right greater than left. Oral dentition is not good  Eyes: Conjunctivae and EOM are normal. Pupils are equal, round, and reactive to light. Right eye exhibits no discharge. Left eye exhibits no discharge. No scleral icterus.  Neck: Normal range of motion. Neck supple. No tracheal deviation present. No thyromegaly present.  Cardiovascular: Normal rate, normal heart sounds and intact distal pulses.  Exam reveals no gallop and no friction rub.   No murmur heard. The rhythm is irregular at 72 per minute  Pulmonary/Chest: Effort normal and breath sounds normal. No respiratory distress. He has no wheezes. He has no rales. He exhibits no tenderness.  Abdominal: Soft. Bowel sounds are normal. He exhibits no mass.  There is no tenderness. There is no rebound and no guarding.  Abdomen is somewhat obese for his size  Musculoskeletal: Normal range of motion. He exhibits no edema and no tenderness.  Lymphadenopathy:    He has no cervical adenopathy.  Neurological: He is alert and oriented to person, place, and time. No cranial nerve deficit.  Skin: Skin is warm and dry. No rash noted. No erythema. No pallor.  Psychiatric: He has a normal mood and  affect. His behavior is normal. Judgment and thought content normal.   BP 160/96  Pulse 78  Temp(Src) 99.8 F (37.7 C) (Oral)  Ht 5\' 5"  (1.651 m)  Wt 155 lb (70.308 kg)  BMI 25.79 kg/m2        Assessment & Plan:    1. HTN (hypertension)   2. Benign localized hyperplasia of prostate with urinary obstruction   3. CAD (coronary artery disease)   4. Hyperlipidemia   5. Vitamin D deficiency   6. History of bladder cancer    Orders Placed This Encounter  Procedures  . Hepatic function panel    Standing Status: Future     Number of Occurrences: 1     Standing Expiration Date: 02/12/2014  . BMP8+EGFR    Standing Status: Future     Number of Occurrences: 1     Standing Expiration Date: 02/12/2014  . Vit D  25 hydroxy (rtn osteoporosis monitoring)    Standing Status: Future     Number of Occurrences: 1     Standing Expiration Date: 02/12/2014  . Lipid panel  . POCT CBC    Standing Status: Future     Number of Occurrences: 1     Standing Expiration Date: 03/14/2013   No orders of the defined types were placed in this encounter.   Patient Instructions  Continue current medications. Continue good therapeutic lifestyle changes.  Fall precautions discussed with patient. Follow up as planned and earlier as needed.  Take Tylenol as needed for joint aches and pain We will give you a flu shot today You will need to return to clinic in the next 3-4 weeks and did a Prevnar shot, which is an additional pneumonia shot Stay as active as possible This winter drink plenty of fluids Keep followup appointment with the urologist Lab work was drawn today and we will call you with the results as soon as they are available   Nyra Capes MD

## 2013-02-13 LAB — LIPID PANEL
Chol/HDL Ratio: 3.4 ratio units (ref 0.0–5.0)
Cholesterol, Total: 117 mg/dL (ref 100–199)
HDL: 34 mg/dL — ABNORMAL LOW (ref >39)
LDL Calculated: 23 mg/dL (ref 0–99)
Triglycerides: 300 mg/dL — ABNORMAL HIGH (ref 0–149)
VLDL Cholesterol Cal: 60 mg/dL — ABNORMAL HIGH (ref 5–40)

## 2013-02-13 LAB — BMP8+EGFR
BUN/Creatinine Ratio: 21 (ref 10–22)
BUN: 20 mg/dL (ref 8–27)
CO2: 25 mmol/L (ref 18–29)
Calcium: 9.6 mg/dL (ref 8.6–10.2)
Chloride: 100 mmol/L (ref 97–108)
Creatinine, Ser: 0.97 mg/dL (ref 0.76–1.27)
GFR calc Af Amer: 80 mL/min/{1.73_m2} (ref >59)
GFR calc non Af Amer: 69 mL/min/{1.73_m2} (ref >59)
Glucose: 81 mg/dL (ref 65–99)
Potassium: 3.5 mmol/L (ref 3.5–5.2)
Sodium: 142 mmol/L (ref 134–144)

## 2013-02-13 LAB — HEPATIC FUNCTION PANEL
ALT: 11 IU/L (ref 0–44)
AST: 31 IU/L (ref 0–40)
Albumin: 4.1 g/dL (ref 3.5–4.7)
Alkaline Phosphatase: 87 IU/L (ref 39–117)
Bilirubin, Direct: 0.24 mg/dL (ref 0.00–0.40)
Total Bilirubin: 0.7 mg/dL (ref 0.0–1.2)
Total Protein: 6.4 g/dL (ref 6.0–8.5)

## 2013-02-13 LAB — SEDIMENTATION RATE: Sed Rate: 3 mm/hr (ref 0–30)

## 2013-02-13 LAB — VITAMIN D 25 HYDROXY (VIT D DEFICIENCY, FRACTURES): Vit D, 25-Hydroxy: 26.6 ng/mL — ABNORMAL LOW (ref 30.0–100.0)

## 2013-02-20 DIAGNOSIS — L851 Acquired keratosis [keratoderma] palmaris et plantaris: Secondary | ICD-10-CM | POA: Diagnosis not present

## 2013-02-20 DIAGNOSIS — B351 Tinea unguium: Secondary | ICD-10-CM | POA: Diagnosis not present

## 2013-02-20 DIAGNOSIS — I70209 Unspecified atherosclerosis of native arteries of extremities, unspecified extremity: Secondary | ICD-10-CM | POA: Diagnosis not present

## 2013-03-02 ENCOUNTER — Ambulatory Visit (INDEPENDENT_AMBULATORY_CARE_PROVIDER_SITE_OTHER): Payer: Medicare Other | Admitting: Urology

## 2013-03-02 DIAGNOSIS — R82998 Other abnormal findings in urine: Secondary | ICD-10-CM

## 2013-03-02 DIAGNOSIS — Z8551 Personal history of malignant neoplasm of bladder: Secondary | ICD-10-CM

## 2013-03-02 DIAGNOSIS — N138 Other obstructive and reflux uropathy: Secondary | ICD-10-CM | POA: Diagnosis not present

## 2013-03-02 DIAGNOSIS — N401 Enlarged prostate with lower urinary tract symptoms: Secondary | ICD-10-CM | POA: Diagnosis not present

## 2013-03-12 DIAGNOSIS — Z86718 Personal history of other venous thrombosis and embolism: Secondary | ICD-10-CM

## 2013-03-12 HISTORY — DX: Personal history of other venous thrombosis and embolism: Z86.718

## 2013-03-14 ENCOUNTER — Emergency Department (HOSPITAL_COMMUNITY): Payer: Medicare Other

## 2013-03-14 ENCOUNTER — Ambulatory Visit (INDEPENDENT_AMBULATORY_CARE_PROVIDER_SITE_OTHER): Payer: Medicare Other

## 2013-03-14 ENCOUNTER — Inpatient Hospital Stay (HOSPITAL_COMMUNITY)
Admission: EM | Admit: 2013-03-14 | Discharge: 2013-03-16 | DRG: 291 | Disposition: A | Discharge status: Home Health | Payer: Medicare Other | Attending: Internal Medicine | Admitting: Internal Medicine

## 2013-03-14 ENCOUNTER — Encounter (HOSPITAL_COMMUNITY): Payer: Self-pay | Admitting: Emergency Medicine

## 2013-03-14 ENCOUNTER — Other Ambulatory Visit: Payer: Self-pay

## 2013-03-14 ENCOUNTER — Ambulatory Visit (INDEPENDENT_AMBULATORY_CARE_PROVIDER_SITE_OTHER): Payer: Medicare Other | Admitting: Family Medicine

## 2013-03-14 ENCOUNTER — Encounter: Payer: Self-pay | Admitting: Family Medicine

## 2013-03-14 VITALS — BP 183/116 | HR 120 | Temp 98.5°F | Resp 22 | Ht 65.0 in | Wt 162.0 lb

## 2013-03-14 DIAGNOSIS — I5022 Chronic systolic (congestive) heart failure: Secondary | ICD-10-CM | POA: Diagnosis present

## 2013-03-14 DIAGNOSIS — I509 Heart failure, unspecified: Secondary | ICD-10-CM | POA: Diagnosis not present

## 2013-03-14 DIAGNOSIS — R0682 Tachypnea, not elsewhere classified: Secondary | ICD-10-CM

## 2013-03-14 DIAGNOSIS — R05 Cough: Secondary | ICD-10-CM

## 2013-03-14 DIAGNOSIS — I5023 Acute on chronic systolic (congestive) heart failure: Principal | ICD-10-CM

## 2013-03-14 DIAGNOSIS — R0602 Shortness of breath: Secondary | ICD-10-CM | POA: Diagnosis not present

## 2013-03-14 DIAGNOSIS — I739 Peripheral vascular disease, unspecified: Secondary | ICD-10-CM | POA: Diagnosis present

## 2013-03-14 DIAGNOSIS — Z7982 Long term (current) use of aspirin: Secondary | ICD-10-CM

## 2013-03-14 DIAGNOSIS — C679 Malignant neoplasm of bladder, unspecified: Secondary | ICD-10-CM | POA: Diagnosis present

## 2013-03-14 DIAGNOSIS — F3289 Other specified depressive episodes: Secondary | ICD-10-CM | POA: Diagnosis present

## 2013-03-14 DIAGNOSIS — I472 Ventricular tachycardia, unspecified: Secondary | ICD-10-CM

## 2013-03-14 DIAGNOSIS — F329 Major depressive disorder, single episode, unspecified: Secondary | ICD-10-CM | POA: Diagnosis present

## 2013-03-14 DIAGNOSIS — Z801 Family history of malignant neoplasm of trachea, bronchus and lung: Secondary | ICD-10-CM

## 2013-03-14 DIAGNOSIS — Z87891 Personal history of nicotine dependence: Secondary | ICD-10-CM

## 2013-03-14 DIAGNOSIS — H409 Unspecified glaucoma: Secondary | ICD-10-CM | POA: Diagnosis present

## 2013-03-14 DIAGNOSIS — J9600 Acute respiratory failure, unspecified whether with hypoxia or hypercapnia: Secondary | ICD-10-CM | POA: Diagnosis present

## 2013-03-14 DIAGNOSIS — R9431 Abnormal electrocardiogram [ECG] [EKG]: Secondary | ICD-10-CM

## 2013-03-14 DIAGNOSIS — Z8551 Personal history of malignant neoplasm of bladder: Secondary | ICD-10-CM

## 2013-03-14 DIAGNOSIS — R059 Cough, unspecified: Secondary | ICD-10-CM | POA: Diagnosis not present

## 2013-03-14 DIAGNOSIS — Z86718 Personal history of other venous thrombosis and embolism: Secondary | ICD-10-CM | POA: Diagnosis present

## 2013-03-14 DIAGNOSIS — I5042 Chronic combined systolic (congestive) and diastolic (congestive) heart failure: Secondary | ICD-10-CM | POA: Diagnosis present

## 2013-03-14 DIAGNOSIS — I428 Other cardiomyopathies: Secondary | ICD-10-CM | POA: Diagnosis present

## 2013-03-14 DIAGNOSIS — I4729 Other ventricular tachycardia: Secondary | ICD-10-CM | POA: Diagnosis not present

## 2013-03-14 DIAGNOSIS — E876 Hypokalemia: Secondary | ICD-10-CM | POA: Diagnosis present

## 2013-03-14 DIAGNOSIS — Z8249 Family history of ischemic heart disease and other diseases of the circulatory system: Secondary | ICD-10-CM

## 2013-03-14 DIAGNOSIS — I2510 Atherosclerotic heart disease of native coronary artery without angina pectoris: Secondary | ICD-10-CM | POA: Diagnosis present

## 2013-03-14 DIAGNOSIS — I4949 Other premature depolarization: Secondary | ICD-10-CM | POA: Diagnosis present

## 2013-03-14 DIAGNOSIS — R079 Chest pain, unspecified: Secondary | ICD-10-CM | POA: Diagnosis not present

## 2013-03-14 DIAGNOSIS — Z79899 Other long term (current) drug therapy: Secondary | ICD-10-CM

## 2013-03-14 DIAGNOSIS — I82403 Acute embolism and thrombosis of unspecified deep veins of lower extremity, bilateral: Secondary | ICD-10-CM

## 2013-03-14 DIAGNOSIS — F1010 Alcohol abuse, uncomplicated: Secondary | ICD-10-CM

## 2013-03-14 DIAGNOSIS — I493 Ventricular premature depolarization: Secondary | ICD-10-CM | POA: Diagnosis present

## 2013-03-14 DIAGNOSIS — IMO0002 Reserved for concepts with insufficient information to code with codable children: Secondary | ICD-10-CM | POA: Diagnosis present

## 2013-03-14 DIAGNOSIS — I10 Essential (primary) hypertension: Secondary | ICD-10-CM

## 2013-03-14 DIAGNOSIS — M129 Arthropathy, unspecified: Secondary | ICD-10-CM | POA: Diagnosis present

## 2013-03-14 DIAGNOSIS — E785 Hyperlipidemia, unspecified: Secondary | ICD-10-CM | POA: Diagnosis present

## 2013-03-14 LAB — COMPREHENSIVE METABOLIC PANEL
ALT: 10 U/L (ref 0–53)
AST: 24 U/L (ref 0–37)
Albumin: 3.8 g/dL (ref 3.5–5.2)
Alkaline Phosphatase: 78 U/L (ref 39–117)
BUN: 12 mg/dL (ref 6–23)
CO2: 21 mEq/L (ref 19–32)
Calcium: 9.8 mg/dL (ref 8.4–10.5)
Chloride: 104 mEq/L (ref 96–112)
Creatinine, Ser: 0.91 mg/dL (ref 0.50–1.35)
GFR calc Af Amer: 85 mL/min — ABNORMAL LOW (ref >90)
GFR calc non Af Amer: 73 mL/min — ABNORMAL LOW (ref >90)
Glucose, Bld: 129 mg/dL — ABNORMAL HIGH (ref 70–99)
Potassium: 3.3 mEq/L — ABNORMAL LOW (ref 3.5–5.1)
Sodium: 140 mEq/L (ref 135–145)
Total Bilirubin: 1.4 mg/dL — ABNORMAL HIGH (ref 0.3–1.2)
Total Protein: 7.4 g/dL (ref 6.0–8.3)

## 2013-03-14 LAB — CBC WITH DIFFERENTIAL/PLATELET
Basophils Absolute: 0 10*3/uL (ref 0.0–0.1)
Basophils Relative: 0 % (ref 0–1)
Eosinophils Absolute: 0 10*3/uL (ref 0.0–0.7)
Eosinophils Relative: 0 % (ref 0–5)
HCT: 34.8 % — ABNORMAL LOW (ref 39.0–52.0)
Hemoglobin: 11.8 g/dL — ABNORMAL LOW (ref 13.0–17.0)
Lymphocytes Relative: 13 % (ref 12–46)
Lymphs Abs: 0.7 10*3/uL (ref 0.7–4.0)
MCH: 30.7 pg (ref 26.0–34.0)
MCHC: 33.9 g/dL (ref 30.0–36.0)
MCV: 90.6 fL (ref 78.0–100.0)
Monocytes Absolute: 0.6 10*3/uL (ref 0.1–1.0)
Monocytes Relative: 11 % (ref 3–12)
Neutro Abs: 4.1 10*3/uL (ref 1.7–7.7)
Neutrophils Relative %: 75 % (ref 43–77)
Platelets: 156 10*3/uL (ref 150–400)
RBC: 3.84 MIL/uL — ABNORMAL LOW (ref 4.22–5.81)
RDW: 17.1 % — ABNORMAL HIGH (ref 11.5–15.5)
WBC: 5.4 10*3/uL (ref 4.0–10.5)

## 2013-03-14 LAB — PRO B NATRIURETIC PEPTIDE: Pro B Natriuretic peptide (BNP): 41893 pg/mL — ABNORMAL HIGH (ref 0–450)

## 2013-03-14 LAB — TROPONIN I
Troponin I: <0.3 ng/mL (ref <0.30)
Troponin I: <0.3 ng/mL (ref <0.30)

## 2013-03-14 LAB — MAGNESIUM: Magnesium: 1.7 mg/dL (ref 1.5–2.5)

## 2013-03-14 MED ORDER — IPRATROPIUM BROMIDE 0.02 % IN SOLN
0.5000 mg | Freq: Once | RESPIRATORY_TRACT | Status: AC
  Administered 2013-03-14: 0.5 mg via RESPIRATORY_TRACT
  Filled 2013-03-14: qty 2.5

## 2013-03-14 MED ORDER — MEGESTROL ACETATE 40 MG PO TABS
20.0000 mg | ORAL_TABLET | Freq: Every day | ORAL | Status: DC
  Administered 2013-03-15 – 2013-03-16 (×2): 20 mg via ORAL
  Filled 2013-03-14: qty 0.5
  Filled 2013-03-14: qty 1
  Filled 2013-03-14: qty 0.5
  Filled 2013-03-14: qty 1
  Filled 2013-03-14 (×2): qty 0.5

## 2013-03-14 MED ORDER — LORAZEPAM 1 MG PO TABS: 1.0000 mg | ORAL_TABLET | Freq: Four times a day (QID) | ORAL | Status: DC | PRN

## 2013-03-14 MED ORDER — METOPROLOL SUCCINATE ER 50 MG PO TB24
50.0000 mg | ORAL_TABLET | Freq: Every morning | ORAL | Status: DC
  Administered 2013-03-15: 50 mg via ORAL
  Filled 2013-03-14: qty 1

## 2013-03-14 MED ORDER — ASPIRIN 81 MG PO TABS: 81.0000 mg | ORAL_TABLET | Freq: Every day | ORAL | Status: DC

## 2013-03-14 MED ORDER — ACETAMINOPHEN 325 MG PO TABS
650.0000 mg | ORAL_TABLET | ORAL | Status: DC | PRN
Start: 2013-03-14 — End: 2013-03-16

## 2013-03-14 MED ORDER — METOPROLOL TARTRATE 25 MG PO TABS
25.0000 mg | ORAL_TABLET | Freq: Once | ORAL | Status: AC
  Administered 2013-03-14: 25 mg via ORAL
  Filled 2013-03-14: qty 1

## 2013-03-14 MED ORDER — ASPIRIN EC 81 MG PO TBEC
81.0000 mg | DELAYED_RELEASE_TABLET | Freq: Every day | ORAL | Status: DC
  Administered 2013-03-15 – 2013-03-16 (×2): 81 mg via ORAL
  Filled 2013-03-14 (×2): qty 1

## 2013-03-14 MED ORDER — DONEPEZIL HCL 5 MG PO TABS
10.0000 mg | ORAL_TABLET | Freq: Every day | ORAL | Status: DC
  Administered 2013-03-14 – 2013-03-15 (×2): 10 mg via ORAL
  Filled 2013-03-14 (×2): qty 2

## 2013-03-14 MED ORDER — LORAZEPAM 1 MG PO TABS: 0.0000 mg | ORAL_TABLET | Freq: Two times a day (BID) | ORAL | Status: DC

## 2013-03-14 MED ORDER — SODIUM CHLORIDE 0.9 % IV SOLN: 250.0000 mL | INTRAVENOUS | Status: DC | PRN

## 2013-03-14 MED ORDER — ENOXAPARIN SODIUM 80 MG/0.8ML ~~LOC~~ SOLN
70.0000 mg | Freq: Two times a day (BID) | SUBCUTANEOUS | Status: DC
  Administered 2013-03-14 – 2013-03-16 (×4): 70 mg via SUBCUTANEOUS
  Filled 2013-03-14 (×4): qty 0.8

## 2013-03-14 MED ORDER — LORAZEPAM 1 MG PO TABS: 0.0000 mg | ORAL_TABLET | Freq: Four times a day (QID) | ORAL | Status: DC

## 2013-03-14 MED ORDER — FUROSEMIDE 10 MG/ML IJ SOLN
20.0000 mg | Freq: Once | INTRAMUSCULAR | Status: AC
  Administered 2013-03-14: 20 mg via INTRAVENOUS
  Filled 2013-03-14: qty 2

## 2013-03-14 MED ORDER — POTASSIUM CHLORIDE CRYS ER 10 MEQ PO TBCR
20.0000 meq | EXTENDED_RELEASE_TABLET | Freq: Every day | ORAL | Status: DC
  Administered 2013-03-15 – 2013-03-16 (×2): 20 meq via ORAL
  Filled 2013-03-14 (×5): qty 2

## 2013-03-14 MED ORDER — METOPROLOL TARTRATE 1 MG/ML IV SOLN
5.0000 mg | Freq: Four times a day (QID) | INTRAVENOUS | Status: DC | PRN
  Administered 2013-03-14 – 2013-03-15 (×2): 5 mg via INTRAVENOUS
  Filled 2013-03-14 (×2): qty 5

## 2013-03-14 MED ORDER — SIMVASTATIN 10 MG PO TABS
10.0000 mg | ORAL_TABLET | Freq: Every day | ORAL | Status: DC
  Administered 2013-03-15 – 2013-03-16 (×2): 10 mg via ORAL
  Filled 2013-03-14 (×2): qty 1

## 2013-03-14 MED ORDER — SODIUM CHLORIDE 0.9 % IJ SOLN: 3.0000 mL | INTRAMUSCULAR | Status: DC | PRN

## 2013-03-14 MED ORDER — ALBUTEROL SULFATE (5 MG/ML) 0.5% IN NEBU
5.0000 mg | INHALATION_SOLUTION | Freq: Once | RESPIRATORY_TRACT | Status: AC
  Administered 2013-03-14: 5 mg via RESPIRATORY_TRACT
  Filled 2013-03-14: qty 1

## 2013-03-14 MED ORDER — LORAZEPAM 2 MG/ML IJ SOLN: 1.0000 mg | Freq: Four times a day (QID) | INTRAMUSCULAR | Status: DC | PRN

## 2013-03-14 MED ORDER — AMLODIPINE BESYLATE 5 MG PO TABS
5.0000 mg | ORAL_TABLET | Freq: Every day | ORAL | Status: DC
  Administered 2013-03-15: 5 mg via ORAL
  Filled 2013-03-14: qty 1

## 2013-03-14 MED ORDER — FUROSEMIDE 10 MG/ML IJ SOLN
40.0000 mg | Freq: Once | INTRAMUSCULAR | Status: AC
  Administered 2013-03-14: 40 mg via INTRAVENOUS
  Filled 2013-03-14: qty 4

## 2013-03-14 MED ORDER — SODIUM CHLORIDE 0.9 % IJ SOLN
3.0000 mL | Freq: Two times a day (BID) | INTRAMUSCULAR | Status: DC
  Administered 2013-03-14 – 2013-03-16 (×4): 3 mL via INTRAVENOUS

## 2013-03-14 MED ORDER — OXYBUTYNIN CHLORIDE ER 5 MG PO TB24
5.0000 mg | ORAL_TABLET | Freq: Every day | ORAL | Status: DC
  Administered 2013-03-14 – 2013-03-15 (×2): 5 mg via ORAL
  Filled 2013-03-14 (×2): qty 1

## 2013-03-14 MED ORDER — TAMSULOSIN HCL 0.4 MG PO CAPS
0.4000 mg | ORAL_CAPSULE | Freq: Every day | ORAL | Status: DC
  Administered 2013-03-15 – 2013-03-16 (×2): 0.4 mg via ORAL
  Filled 2013-03-14 (×2): qty 1

## 2013-03-14 MED ORDER — DONEPEZIL HCL 10 MG PO TBDP: 10.0000 mg | ORAL_TABLET | Freq: Every day | ORAL | Status: DC

## 2013-03-14 MED ORDER — ASPIRIN 81 MG PO CHEW
324.0000 mg | CHEWABLE_TABLET | Freq: Once | ORAL | Status: AC
  Administered 2013-03-14: 324 mg via ORAL

## 2013-03-14 MED ORDER — FUROSEMIDE 10 MG/ML IJ SOLN
60.0000 mg | Freq: Two times a day (BID) | INTRAMUSCULAR | Status: DC
  Administered 2013-03-15 – 2013-03-16 (×3): 60 mg via INTRAVENOUS
  Filled 2013-03-14 (×3): qty 6

## 2013-03-14 MED ORDER — POTASSIUM CHLORIDE CRYS ER 20 MEQ PO TBCR
40.0000 meq | EXTENDED_RELEASE_TABLET | Freq: Once | ORAL | Status: AC
  Administered 2013-03-14: 40 meq via ORAL
  Filled 2013-03-14: qty 2

## 2013-03-14 MED ORDER — VITAMIN B-1 100 MG PO TABS
100.0000 mg | ORAL_TABLET | Freq: Every day | ORAL | Status: DC
  Administered 2013-03-15 – 2013-03-16 (×2): 100 mg via ORAL
  Filled 2013-03-14 (×2): qty 1

## 2013-03-14 MED ORDER — LATANOPROST 0.005 % OP SOLN
OPHTHALMIC | Status: AC
  Filled 2013-03-14: qty 2.5

## 2013-03-14 MED ORDER — FOLIC ACID 1 MG PO TABS
1.0000 mg | ORAL_TABLET | Freq: Every day | ORAL | Status: DC
  Administered 2013-03-15 – 2013-03-16 (×2): 1 mg via ORAL
  Filled 2013-03-14 (×3): qty 1

## 2013-03-14 MED ORDER — ONDANSETRON HCL 4 MG/2ML IJ SOLN: 4.0000 mg | Freq: Four times a day (QID) | INTRAMUSCULAR | Status: DC | PRN

## 2013-03-14 MED ORDER — LATANOPROST 0.005 % OP SOLN
1.0000 [drp] | Freq: Every day | OPHTHALMIC | Status: DC
  Administered 2013-03-15: 1 [drp] via OPHTHALMIC
  Filled 2013-03-14: qty 2.5

## 2013-03-14 MED ORDER — GABAPENTIN 100 MG PO CAPS
100.0000 mg | ORAL_CAPSULE | Freq: Two times a day (BID) | ORAL | Status: DC
  Administered 2013-03-14 – 2013-03-16 (×4): 100 mg via ORAL
  Filled 2013-03-14 (×4): qty 1

## 2013-03-14 MED ORDER — IRBESARTAN 150 MG PO TABS
150.0000 mg | ORAL_TABLET | Freq: Every day | ORAL | Status: DC
  Administered 2013-03-15 – 2013-03-16 (×2): 150 mg via ORAL
  Filled 2013-03-14 (×2): qty 1

## 2013-03-14 MED ORDER — THIAMINE HCL 100 MG/ML IJ SOLN
100.0000 mg | Freq: Every day | INTRAMUSCULAR | Status: DC
  Filled 2013-03-14: qty 2

## 2013-03-14 MED ORDER — ADULT MULTIVITAMIN W/MINERALS CH
1.0000 | ORAL_TABLET | Freq: Every day | ORAL | Status: DC
  Administered 2013-03-15 – 2013-03-16 (×2): 1 via ORAL
  Filled 2013-03-14 (×2): qty 1

## 2013-03-14 NOTE — Progress Notes (Signed)
ANTICOAGULATION CONSULT NOTE - Initial Consult  Pharmacy Consult for Lovenox Indication: r/o DVT  Allergies  Allergen Reactions  . Lipitor [Atorvastatin]     Patient Measurements: Height: 5\' 1"  (154.9 cm) Weight: 156 lb 12 oz (71.1 kg) IBW/kg (Calculated) : 52.3  Vital Signs: Temp: 97.6 F (36.4 C) (12/03 2111) Temp src: Axillary (12/03 2111) BP: 167/102 mmHg (12/03 2111) Pulse Rate: 105 (12/03 2111)  Labs:  Recent Labs  03/14/13 1655  HGB 11.8*  HCT 34.8*  PLT 156  CREATININE 0.91  TROPONINI <0.30    Estimated Creatinine Clearance: 46.5 ml/min (by C-G formula based on Cr of 0.91).   Medical History: Past Medical History  Diagnosis Date  . HTN (hypertension)   . Hyperlipidemia   . PVCs (premature ventricular contractions)   . Glaucoma   . Bladder tumor   . CAD (coronary artery disease) CARDIOLOGIST- DR HOCHRIEN -- LAST VIIST [id number] IN EPIC    Non obstructive 25% LAD 2006  . DDD (degenerative disc disease)   . Arthritis   . PVD (peripheral vascular disease)     Right ABI .75, Left .78 (2006)  . Popliteal artery aneurysm, bilateral DECUMENTED CHRONIC PARTIAL OCCLUSION--  PT DENIES CLAUDICATION OR ANY OTHER SYMPTOMS  . Frequency of urination   . Nocturia   . Bladder cancer 03/15/2012    Medications:  Scheduled:  . [START ON 03/15/2013] amLODipine  5 mg Oral Daily  . [START ON 03/15/2013] aspirin EC  81 mg Oral Daily  . donepezil  10 mg Oral QHS  . enoxaparin (LOVENOX) injection  70 mg Subcutaneous Q12H  . [START ON 03/15/2013] folic acid  1 mg Oral Daily  . furosemide  20 mg Intravenous Once  . [START ON 03/15/2013] furosemide  60 mg Intravenous Q12H  . gabapentin  100 mg Oral BID  . [START ON 03/15/2013] irbesartan  150 mg Oral Daily  . latanoprost  1 drop Both Eyes QHS  . LORazepam  0-4 mg Oral Q6H   Followed by  . [START ON 03/16/2013] LORazepam  0-4 mg Oral Q12H  . [START ON 03/15/2013] megestrol  20 mg Oral Daily  . [START ON 03/15/2013] metoprolol  succinate  50 mg Oral q morning - 10a  . [START ON 03/15/2013] multivitamin with minerals  1 tablet Oral Daily  . oxybutynin  5 mg Oral QHS  . [START ON 03/15/2013] potassium chloride  20 mEq Oral Daily  . [START ON 03/15/2013] simvastatin  10 mg Oral q1800  . sodium chloride  3 mL Intravenous Q12H  . [START ON 03/15/2013] tamsulosin  0.4 mg Oral Daily  . [START ON 03/15/2013] thiamine  100 mg Oral Daily   Or  . [START ON 03/15/2013] thiamine  100 mg Intravenous Daily    Assessment: 79 yo M admitted with HF exacerbation.  He is also starting on full dose Lovenox until LLE DVT can be excluded (leg swelling L>R).   No bleeding noted.  CBC reviewed.  Renal function is at patient's baseline.   Goal of Therapy:  Anti-Xa level 0.6-1.2 units/ml 4hrs after LMWH dose given Monitor platelets by anticoagulation protocol: Yes   Plan:  Lovenox 70mg  sq q12h (1mg /kg) CBC on MWF F/U doppler results  Elson Clan 03/14/2013,10:39 PM

## 2013-03-14 NOTE — Progress Notes (Signed)
Subjective:    Patient ID: Scott [name], male    DOB: 23-May-1921, 79 y.o.   MRN: [id number]  HPI  Patient presents today with chief complaint of cough, increased work of breathing, progressive fatigue, generalized malaise. Per the son, patient was extremely weak today with patient taking an extremely long amount of time for patient to get dressed as well as patient having persistent cough and mild increased work of breathing. Patient states he has been feeling with mild upper respiratory symptoms over the past 2-3 days and clear rhinorrhea nasal congestion. Patient has a baseline history of coronary artery disease, premature ventricular contractions, mixed CHF based on echocardiogram at [facility] 06/2012, prior syncopal episodes as well as fatigue. Caregiver states that patient had a similar episode last year where he had a pneumonia. Patient currently denies any chest pains. Does have some mild difficulty breathing. Upon presentation to our clinic today patient was noted to have a systolic pressure of 183 of 116 with a respiratory rate in the mid 20s with heart rate in the 130s. An EKG showed A. tach with questionable ST depression/T-wave inversions in the anterior leads with a heart rate in the 110s. Preliminary chest x-ray shows marked cardiomegaly with questionable bronchitic versus pulmonary vascular congestion changes and possible left lower lobe pneumonia. Oxygen level has been fairly reassuring and 99% since presentation. No nausea or diaphoresis currently.  Patient Active Problem List   Diagnosis Date Noted  . Fatigue 11/02/2012  . Constipation 11/02/2012  . Alcohol abuse 08/27/2012  . Syncope 06/21/2012  . Bladder cancer 03/15/2012  . Benign localized hyperplasia of prostate with urinary obstruction 03/15/2012  . HTN (hypertension)   . Hyperlipidemia   . PVCs (premature ventricular contractions)   . CAD (coronary artery disease)       Review of Systems  All other  systems reviewed and are negative.       Objective:   Physical Exam  Constitutional: He is oriented to person, place, and time.  Underweight, elderly appearing. Mild increased work of breathing some difficulty with speaking full sentences  HENT:  Head: Normocephalic and atraumatic.  Eyes: Conjunctivae are normal. Pupils are equal, round, and reactive to light.  Neck: Normal range of motion. Neck supple.  Cardiovascular: Normal rate and regular rhythm.   Pulmonary/Chest:  Minimal to mild increased work of breathing. Faint rales in the lower lobes.  Abdominal: Soft.  Musculoskeletal: Normal range of motion.  Positive lower extremity edema 2-3+ left greater than right No popliteal tenderness  Neurological: He is alert and oriented to person, place, and time.  Skin: Skin is warm.   EKG: v tach w/HR in 110s, ? ST depressions/TWIs in lateral leads [facility] reading (PRIMARY) by  Dr. Alvester Morin Preliminary read with cardiomegaly and vascular congestion  pneumonia in LLL                                       Assessment & Plan:  Cough - Plan: CHEST X-RAY 2 VIEWS (71020), CHEST X-RAY 2 VIEWS (71020), DG Chest 2 View  SOB (shortness of breath) - Plan: EKG 12-Lead  V tach  Tachypnea  Differential diagnoses for symptoms is fairly broad however there is a much higher concern for cardiac source of symptoms given prior history as well as findings on EKG (ventricular tachycardia and questionable ST depression/T-wave inversions in lateral leads) as well as chest x-ray today  with cardiomegaly and vascular congestion  pneumonia. Vital signs remained stable currently with reassuring oxygen saturation. Patient given full dose aspirin with supplemental oxygen at 2 L to help with work of breathing. Respiratory drive seems to be improving with this. Plan for patient to be sent to the emergency room via EMS for further evaluation of symptoms. Greater than 50% of 60 minutes were spent with patient in terms  of direct patient care and/or care coordination

## 2013-03-14 NOTE — ED Notes (Signed)
Pt with SOB 2 days ago, was seen at PCP today, pt denies CP

## 2013-03-14 NOTE — ED Notes (Signed)
Pt takes 81mg  ASA daily and had today's dose, pt states at MD's office pt received 324 mg chewable ASA

## 2013-03-14 NOTE — H&P (Signed)
Scott [name] History and Physical  Scott [name]:[id number] DOB: 02-12-1922 DOA: 03/14/2013   PCP: [name], MD  Specialists: He is followed by Dr. Hillis Range, with Cardiology  Chief Complaint: Cough and shortness of breath for the last 2 days  HPI: Scott [name] is a 79 y.o. male with a past medical history of diastolic heart failure, hypertension, alcohol abuse, history of syncope, who was in his usual state of health till about 2 days ago, when he started having a cough, which mainly occurred at nighttime. As result of this he couldn't sleep. He was feeling short of breath. He describes the cough is mostly dry, and denies any blood in the sputum. He has had chronic leg swelling, but has been more so recently. Denies any fever or chills. His wife is a paraplegic, who was discharged from the hospital recently. She has a history of MRSA. He has been visiting her daily in the hospital. Denies any nausea, vomiting. No chest pains. He has been very fatigued over the last couple days as well. Denies any weight gain and he feels as if he may have lost some weight. Due to his symptoms he called his son. He was taken to his primary care physician today, who referred him to the emergency department.  Home Medications: Prior to Admission medications   Medication Sig Start Date End Date Taking? Authorizing Provider  amLODipine (NORVASC) 5 MG tablet Take 5 mg by mouth daily.   Yes Historical Provider, MD  aspirin 81 MG tablet Take 81 mg by mouth daily.    Yes Historical Provider, MD  diclofenac (VOLTAREN) 75 MG EC tablet Take 75 mg by mouth 2 (two) times daily. 02/13/13  Yes Historical Provider, MD  donepezil (ARICEPT ODT) 10 MG disintegrating tablet TAKE 1/2 TABLET DAILY AT BEDTIME 01/18/13  Yes Ernestina Penna, MD  fenofibrate 160 MG tablet TAKE 1 TABLET DAILY 01/05/13  Yes Ernestina Penna, MD  gabapentin (NEURONTIN) 100 MG capsule Take 100 mg by mouth 2 (two) times daily. 02/20/13  Yes  Historical Provider, MD  HYDROcodone-acetaminophen Scott [name]) 10-325 MG per tablet  10/17/12  Yes Historical Provider, MD  KLOR-CON 10 10 MEQ tablet TAKE 1 TABLET BY MOUTH EVERY DAY 10/24/12  Yes Ernestina Penna, MD  Scott [name] 0.01 % SOLN INSTILL 1 DROP IN Scott [name] EYE AT BEDTIME 01/05/13  Yes Ernestina Penna, MD  megestrol (MEGACE) 20 MG tablet Take 20 mg by mouth daily.  01/18/13  Yes Historical Provider, MD  metoprolol (TOPROL-XL) 100 MG 24 hr tablet Take 50 mg by mouth every morning.    Yes Historical Provider, MD  niacin (NIASPAN) 1000 MG CR tablet TAKE 1 TABLET DAILY 01/05/13  Yes Ernestina Penna, MD  olmesartan-hydrochlorothiazide (BENICAR HCT) 20-12.5 MG per tablet Take 1 tablet by mouth daily.    Yes Historical Provider, MD  oxybutynin (DITROPAN-XL) 5 MG 24 hr tablet TAKE 1 TABLET DAILY 01/05/13  Yes Ernestina Penna, MD  simvastatin (ZOCOR) 10 MG tablet TAKE 1 TABLET DAILY 01/05/13  Yes Ernestina Penna, MD  tamsulosin (FLOMAX) 0.4 MG CAPS capsule TAKE 1 CAPSULE DAILY 01/05/13  Yes Ernestina Penna, MD    Allergies:  Allergies  Allergen Reactions  . Lipitor [Atorvastatin]     Past Medical History: Past Medical History  Diagnosis Date  . HTN (hypertension)   . Hyperlipidemia   . PVCs (premature ventricular contractions)   . Glaucoma   . Bladder tumor   . CAD (coronary  artery disease) CARDIOLOGIST- DR HOCHRIEN -- LAST VIIST [id number] IN EPIC    Non obstructive 25% LAD 2006  . DDD (degenerative disc disease)   . Arthritis   . PVD (peripheral vascular disease)     Right ABI .75, Left .78 (2006)  . Popliteal artery aneurysm, bilateral DECUMENTED CHRONIC PARTIAL OCCLUSION--  PT DENIES CLAUDICATION OR ANY OTHER SYMPTOMS  . Frequency of urination   . Nocturia   . Bladder cancer 03/15/2012    Past Surgical History  Procedure Laterality Date  . Transthoracic echocardiogram  09-22-2010    MODERATE LVH/ EF 55%/ GRADE I DIASTOLIC DYSFUNCTION/ AORTIC SCLEROSIS WITHOUT STENOSIS/  RV  SYSTOLIC  MILDLY  REDUCED FUNCTION  . Cardiovascular stress test  10-15-2010    LOW RISK NUCLEAR STUDY/ NO EVIDENCE OF ISCHEMIA/ NORMAL EF  . Cardiac catheterization  05-11-2004  [facility]    MINIMAL CORANARY PLAQUE/ NORMAL LVF/ EF 55%/  NON-OBSTRUCTIVE LAD 25%  . Shoulder surgery  20 YRS AGO  (APPROX. 1990'S)  . Transurethral resection of bladder tumor  10/07/2011    Procedure: TRANSURETHRAL RESECTION OF BLADDER TUMOR (TURBT);  Surgeon: Anner Crete, MD;  Location: [name];  Service: Urology;  Laterality: N/A;  . Cystoscopy  10/07/2011    Procedure: CYSTOSCOPY;  Surgeon: Anner Crete, MD;  Location: [facility];  Service: Urology;  Laterality: N/A;  . Cystoscopy with biopsy  03/14/2012    Procedure: CYSTOSCOPY WITH BIOPSY;  Surgeon: Anner Crete, MD;  Location: [facility];  Service: Urology;  Laterality: N/A;  WITH FULGURATION  . Transurethral resection of prostate  03/14/2012    Procedure: TRANSURETHRAL RESECTION OF THE PROSTATE WITH GYRUS INSTRUMENTS;  Surgeon: Anner Crete, MD;  Location: [facility];  Service: Urology;  Laterality: N/A;    Social History: Patient lives in [place] with his paraplegic wife. He denies smoking currently and quit about 40 years ago. However, he continues to drink alcohol due to depression according to the son. Patient was reluctant to mention the quantity, but it appears, that it could be about 6-7 drinks a week. Could be more. He is independent with daily activities. Denies any illicit drug use.  Family History:  Family History  Problem Relation Age of Onset  . Hypertension Mother   . Lung cancer Sister      Review of Systems - History obtained from the patient General ROS: positive for  - fatigue Psychological ROS: negative Ophthalmic ROS: negative ENT ROS: negative Allergy and Immunology ROS: negative Hematological and Lymphatic ROS: negative Endocrine ROS: negative Respiratory ROS: as in hpi Cardiovascular ROS: as in  hpi Gastrointestinal ROS: no abdominal pain, change in bowel habits, or black or bloody stools Genito-Urinary ROS: no dysuria, trouble voiding, or hematuria Musculoskeletal ROS: negative Neurological ROS: no TIA or stroke symptoms Dermatological ROS: negative  Physical Examination  Filed Vitals:   03/14/13 1740 03/14/13 1745 03/14/13 1800 03/14/13 1900  BP:   173/110 159/113  Pulse: 117 120 117   Temp:      Resp: 18 16 19 22   SpO2: 100% 99% 99%     General appearance: alert, cooperative, appears stated age and no distress Head: Normocephalic, without obvious abnormality, atraumatic Eyes: conjunctivae/corneas clear. PERRL, EOM's intact.  Throat: lips, mucosa, and tongue normal; teeth and gums normal Neck: no adenopathy, no carotid bruit, no JVD, supple, symmetrical, trachea midline and thyroid not enlarged, symmetric, no tenderness/mass/nodules Resp: Has crackles bilateral lung fields, about halfway up. No wheezing. No  rhonchi Cardio: S1-S2 is tachycardic, regular with a few premature beats. No S3, S4. No rubs, murmurs, or bruits. Pedal edema is present, which is about 1+ pitting. GI: soft, non-tender; bowel sounds normal; no masses,  no organomegaly Extremities: He has pitting edema bilaterally, left more than the right. No erythema. No calf tenderness. Pulses: 2+ and symmetric Skin: Skin color, texture, turgor normal. No rashes or lesions Lymph nodes: Cervical, supraclavicular, and axillary nodes normal. Neurologic: He is alert and oriented x3. No focal neurological deficits are present.  Laboratory Data: Results for orders placed during the hospital encounter of 03/14/13 (from the past 48 hour(s))  TROPONIN I     Status: None   Collection Time    03/14/13  4:55 PM      Result Value Range   Troponin I <0.30  <0.30 ng/mL   Comment:            Due to the release kinetics of cTnI,     a negative result within the first hours     of the onset of symptoms does not rule out      myocardial infarction with certainty.     If myocardial infarction is still suspected,     repeat the test at appropriate intervals.  CBC WITH DIFFERENTIAL     Status: Abnormal   Collection Time    03/14/13  4:55 PM      Result Value Range   WBC 5.4  4.0 - 10.5 K/uL   RBC 3.84 (*) 4.22 - 5.81 MIL/uL   Hemoglobin 11.8 (*) 13.0 - 17.0 g/dL   HCT 16.1 (*) 09.6 - 04.5 %   MCV 90.6  78.0 - 100.0 fL   MCH 30.7  26.0 - 34.0 pg   MCHC 33.9  30.0 - 36.0 g/dL   RDW 40.9 (*) 81.1 - 91.4 %   Platelets 156  150 - 400 K/uL   Neutrophils Relative % 75  43 - 77 %   Neutro Abs 4.1  1.7 - 7.7 K/uL   Lymphocytes Relative 13  12 - 46 %   Lymphs Abs 0.7  0.7 - 4.0 K/uL   Monocytes Relative 11  3 - 12 %   Monocytes Absolute 0.6  0.1 - 1.0 K/uL   Eosinophils Relative 0  0 - 5 %   Eosinophils Absolute 0.0  0.0 - 0.7 K/uL   Basophils Relative 0  0 - 1 %   Basophils Absolute 0.0  0.0 - 0.1 K/uL  COMPREHENSIVE METABOLIC PANEL     Status: Abnormal   Collection Time    03/14/13  4:55 PM      Result Value Range   Sodium 140  135 - 145 mEq/L   Potassium 3.3 (*) 3.5 - 5.1 mEq/L   Chloride 104  96 - 112 mEq/L   CO2 21  19 - 32 mEq/L   Glucose, Bld 129 (*) 70 - 99 mg/dL   BUN 12  6 - 23 mg/dL   Creatinine, Ser 7.82  0.50 - 1.35 mg/dL   Calcium 9.8  8.4 - 95.6 mg/dL   Total Protein 7.4  6.0 - 8.3 g/dL   Albumin 3.8  3.5 - 5.2 g/dL   AST 24  0 - 37 U/L   ALT 10  0 - 53 U/L   Alkaline Phosphatase 78  39 - 117 U/L   Total Bilirubin 1.4 (*) 0.3 - 1.2 mg/dL   GFR calc non Af Amer 73 (*) >90 mL/min  GFR calc Af Amer 85 (*) >90 mL/min   Comment: (NOTE)     The eGFR has been calculated using the CKD EPI equation.     This calculation has not been validated in all clinical situations.     eGFR's persistently <90 mL/min signify possible Chronic Kidney     Disease.  PRO B NATRIURETIC PEPTIDE     Status: Abnormal   Collection Time    03/14/13  4:55 PM      Result Value Range   Pro B Natriuretic peptide  (BNP) 41893.0 (*) 0 - 450 pg/mL    Radiology Reports: Dg Chest 2 View  03/14/2013   CLINICAL DATA:  Cough  EXAM: CHEST  2 VIEW  COMPARISON:  Chest radiograph 03/06/2012, CT 12/26/2010  FINDINGS: Stable enlarged cardiac silhouette. There is a prominent vascular shadow superior to the right hilum unchanged. There is central venous pulmonary congestion. No focal consolidation. Trace bilateral effusions. Multiple wedge compression fractures of the thoracic spine with kyphosis are not significantly changed.  IMPRESSION: 1. Cardiomegaly, central venous pulmonary congestion, and mild effusions suggest mild congestive heart failure. 2. No focal infiltrate.   Electronically Signed   By: Genevive Bi M.D.   On: 03/14/2013 16:12   Dg Chest Portable 1 View  03/14/2013   CLINICAL DATA:  Shortness of breath  EXAM: PORTABLE CHEST - 1 VIEW  COMPARISON:  March 14, 2013, 03/06/2012  FINDINGS: Mild to moderate cardiac enlargement. Moderate vascular congestion of pulmonary venous hypertension. Tiny effusions unchanged. No pulmonary edema or consolidation appreciated.  IMPRESSION: No change when compared to radiograph performed earlier today with findings again consistent with mild congestive heart failure.   Electronically Signed   By: Esperanza Heir M.D.   On: 03/14/2013 17:01    Electrocardiogram: EKG shows sinus tachycardia. 120 beats per minute with PVCs. There is evidence for ST depression in V5 and V6. T inversion is noted in V6 as well. Similar changes also noted in 1 and aVL. These could be rate related. These changes are new when compared to EKG from March.  Problem List  Principal Problem:   Acute CHF (congestive heart failure) Active Problems:   HTN (hypertension)   Hyperlipidemia   PVCs (premature ventricular contractions)   Bladder cancer   Alcohol abuse   Assessment: This is an 79 year old, African American male, who presents with cough and shortness of breath. There is some element  suggestive of orthopnea. His chest x-ray suggestive of congestive heart failure as is his examination. He does have pedal edema and it appears, that his left leg is slightly more swollen than the right. There is no erythema or calf tenderness. He does have EKG changes as well.  Plan: #1 Acute congestive heart failure: Possibly biventricular. He was noted to have EF of about 45% on cardiac catheterization earlier this year and was noted to have diastolic dysfunction on an echo from 2 years ago. Reassuringly no significant CAD was noted at the time of his catheterization. We will proceed with an echocardiogram. We'll treat him with Lasix, fluid restriction. Strict ins and outs. Will consult cardiology due to his EKG changes.  #2 sinus tachycardia with ST depressions and PVCs: Recent cardiac catheterization is reassuring. He does have hypokalemia, which will be repleted. Magnesium level will be checked. Echocardiogram will provide more information. EKG will be repeated in the morning. Due to these issues and his unequal leg swelling we will place him on full dose Lovenox for now. Troponin will be  cycled.  #3 lower extremity edema: Most likely due to CHF, but since his left leg is larger than the right we will proceed with a Doppler study. Full dose Lovenox as mentioned above.  #4 history of alcohol abuse: Since history is not entirely reliable we will place him on Ativan protocol and thiamine. Alcohol could be contributing to his cardiac dysfunction.  #5 history of hypertension: Monitor blood pressure closely. Give him a dose of beta blocker tonight.  #6 history of glaucoma: Continue with eye drops   DVT Prophylaxis: He'll be on full dose Lovenox for now as discussed above Code Status: Full code Family Communication: Discussed with the patient and his son  Disposition Plan: Admit to telemetry   Further management decisions will depend on results of further testing and patient's response to  treatment.  [facility]  Scott [name] Pager [phone]  If 7PM-7AM, please contact night-coverage [email or web address] Password [name]  03/14/2013, 7:46 PM

## 2013-03-14 NOTE — ED Notes (Signed)
Denies SOB at this time, denies N/V or dizziness

## 2013-03-14 NOTE — ED Notes (Signed)
Hospitalist at bedside 

## 2013-03-14 NOTE — ED Notes (Signed)
[facility] reports pt hypertensive, pt has hx of HTN

## 2013-03-14 NOTE — ED Provider Notes (Signed)
CSN: [id number]     Arrival date & time 03/14/13  1639 History  This chart was scribed for Benny Lennert, MD by Leone Payor, ED Scribe. This patient was seen in room APA02/APA02 and the patient's care was started 4:47 PM.    Chief Complaint  Patient presents with  . Shortness of Breath    Patient is a 79 y.o. male presenting with shortness of breath. The history is provided by the patient. No language interpreter was used.  Shortness of Breath Severity:  Moderate Onset quality:  Gradual Duration:  2 days Timing:  Constant Progression:  Worsening Chronicity:  New Relieved by:  Nothing Worsened by:  Nothing tried Ineffective treatments:  None tried Associated symptoms: cough   Associated symptoms: no abdominal pain, no chest pain, no fever, no headaches and no rash    HPI Comments: Scott [name] is a 79 y.o. Male with past medical history of HTN, HLD, CAD, PVD brought in by ambulance, who presents to the Emergency Department complaining of gradual onset, constant, gradually worsening cough and SOB that began 2 days ago. Pt states the cough is productive of clear/white phlegm. Pt denies similar symptoms in the past. He denies fever, chills, abdominal pain.   Past Medical History  Diagnosis Date  . HTN (hypertension)   . Hyperlipidemia   . PVCs (premature ventricular contractions)   . Glaucoma   . Bladder tumor   . CAD (coronary artery disease) CARDIOLOGIST- DR HOCHRIEN -- LAST VIIST [id number] IN [facility]    Non obstructive 25% LAD 2006  . DDD (degenerative disc disease)   . Arthritis   . PVD (peripheral vascular disease)     Right ABI .75, Left .78 (2006)  . Popliteal artery aneurysm, bilateral DECUMENTED CHRONIC PARTIAL OCCLUSION--  PT DENIES CLAUDICATION OR ANY OTHER SYMPTOMS  . Frequency of urination   . Nocturia   . Bladder cancer 03/15/2012   Past Surgical History  Procedure Laterality Date  . Transthoracic echocardiogram  09-22-2010    MODERATE LVH/ EF 55%/ GRADE I  DIASTOLIC DYSFUNCTION/ AORTIC SCLEROSIS WITHOUT STENOSIS/  RV  SYSTOLIC  MILDLY REDUCED FUNCTION  . Cardiovascular stress test  10-15-2010    LOW RISK NUCLEAR STUDY/ NO EVIDENCE OF ISCHEMIA/ NORMAL EF  . Cardiac catheterization  05-11-2004  [facility]    MINIMAL CORANARY PLAQUE/ NORMAL LVF/ EF 55%/  NON-OBSTRUCTIVE LAD 25%  . Shoulder surgery  20 YRS AGO  (APPROX. 1990'S)  . Transurethral resection of bladder tumor  10/07/2011    Procedure: TRANSURETHRAL RESECTION OF BLADDER TUMOR (TURBT);  Surgeon: Anner Crete, MD;  Location: [facility];  Service: Urology;  Laterality: N/A;  . Cystoscopy  10/07/2011    Procedure: CYSTOSCOPY;  Surgeon: Anner Crete, MD;  Location: [facility];  Service: Urology;  Laterality: N/A;  . Cystoscopy with biopsy  03/14/2012    Procedure: CYSTOSCOPY WITH BIOPSY;  Surgeon: Anner Crete, MD;  Location: [facility];  Service: Urology;  Laterality: N/A;  WITH FULGURATION  . Transurethral resection of prostate  03/14/2012    Procedure: TRANSURETHRAL RESECTION OF THE PROSTATE WITH GYRUS INSTRUMENTS;  Surgeon: Anner Crete, MD;  Location: [facility];  Service: Urology;  Laterality: N/A;   Family History  Problem Relation Age of Onset  . Hypertension Mother    History  Substance Use Topics  . Smoking status: Former Smoker -- 1.00 packs/day for 20 years    Types: Cigarettes    Start date: 04/12/1942  Quit date: 04/13/1975  . Smokeless tobacco: Never Used  . Alcohol Use: 1.8 oz/week    3 Shots of liquor per week    Review of Systems  Constitutional: Negative for fever, chills, appetite change and fatigue.  HENT: Negative for congestion, ear discharge and sinus pressure.   Eyes: Negative for discharge.  Respiratory: Positive for cough and shortness of breath.   Cardiovascular: Negative for chest pain.  Gastrointestinal: Negative for abdominal pain and diarrhea.  Genitourinary: Negative for frequency and hematuria.  Musculoskeletal: Negative  for back pain.  Skin: Negative for rash.  Neurological: Negative for seizures and headaches.  Psychiatric/Behavioral: Negative for hallucinations.    Allergies  Lipitor  Home Medications   Current Outpatient Rx  Name  Route  Sig  Dispense  Refill  . amLODipine (NORVASC) 5 MG tablet   Oral   Take 5 mg by mouth daily.         Marland Kitchen aspirin 81 MG tablet   Oral   Take 81 mg by mouth daily.          . diclofenac (VOLTAREN) 75 MG EC tablet   Oral   Take 75 mg by mouth 2 (two) times daily.         Marland Kitchen donepezil (ARICEPT ODT) 10 MG disintegrating tablet      TAKE 1/2 TABLET DAILY AT BEDTIME   15 tablet   1   . fenofibrate 160 MG tablet      TAKE 1 TABLET DAILY   90 tablet   0   . gabapentin (NEURONTIN) 100 MG capsule   Oral   Take 100 mg by mouth 2 (two) times daily.         Marland Kitchen HYDROcodone-acetaminophen (NORCO) 10-325 MG per tablet               . KLOR-CON 10 10 MEQ tablet      TAKE 1 TABLET BY MOUTH EVERY DAY   30 tablet   3   . LUMIGAN 0.01 % SOLN      INSTILL 1 DROP IN [facility] EYE AT BEDTIME   2.5 mL   2     Dispense as written.   . megestrol (MEGACE) 20 MG tablet   Oral   Take 20 mg by mouth daily.          . metoprolol (TOPROL-XL) 100 MG 24 hr tablet   Oral   Take 50 mg by mouth every morning.          . niacin (NIASPAN) 1000 MG CR tablet      TAKE 1 TABLET DAILY   90 tablet   0   . olmesartan-hydrochlorothiazide (BENICAR HCT) 20-12.5 MG per tablet   Oral   Take 1 tablet by mouth daily.          Marland Kitchen oxybutynin (DITROPAN-XL) 5 MG 24 hr tablet      TAKE 1 TABLET DAILY   90 tablet   0   . simvastatin (ZOCOR) 10 MG tablet      TAKE 1 TABLET DAILY   90 tablet   0   . tamsulosin (FLOMAX) 0.4 MG CAPS capsule      TAKE 1 CAPSULE DAILY   90 capsule   0    BP 169/112  Pulse 120  Temp(Src) 99.2 F (37.3 C)  Resp 16  SpO2 99% Physical Exam  Nursing note and vitals reviewed. Constitutional: He is oriented to person, place,  and time. He appears well-developed.  HENT:  Head: Normocephalic.  Eyes: Conjunctivae and EOM are normal. No scleral icterus.  Neck: Neck supple. No thyromegaly present.  Cardiovascular: An irregular rhythm present. Tachycardia present.  Exam reveals no gallop and no friction rub.   No murmur heard. Pulmonary/Chest: No stridor. He has wheezes (minimal wheezing bilaterally). He has no rales. He exhibits no tenderness.  Abdominal: He exhibits no distension. There is no tenderness. There is no rebound.  Musculoskeletal: Normal range of motion. He exhibits no edema.  Lymphadenopathy:    He has no cervical adenopathy.  Neurological: He is oriented to person, place, and time. He exhibits normal muscle tone. Coordination normal.  Skin: No rash noted. No erythema.  Psychiatric: He has a normal mood and affect. His behavior is normal.    ED Course  Procedures   DIAGNOSTIC STUDIES: Oxygen Saturation is 99% on RA, normal by my interpretation.    COORDINATION OF CARE: 4:57 PM Will order CXR, troponin, CBC, CMP, pro b natriuretic peptide. Discussed treatment plan with pt at bedside and pt agreed to plan.  6:22 PM Updated pt and family on lab and imaging results. Pt's son states that patient lives at home and has 24 hour care. He understands that he will be admitted for a few days.   Medications  albuterol (PROVENTIL) (5 MG/ML) 0.5% nebulizer solution 5 mg (5 mg Nebulization Given 03/14/13 1716)  ipratropium (ATROVENT) nebulizer solution 0.5 mg (0.5 mg Nebulization Given 03/14/13 1716)  furosemide (LASIX) injection 40 mg (40 mg Intravenous Given 03/14/13 1740)      Labs Review Labs Reviewed  CBC WITH DIFFERENTIAL - Abnormal; Notable for the following:    RBC 3.84 (*)    Hemoglobin 11.8 (*)    HCT 34.8 (*)    RDW 17.1 (*)    All other components within normal limits  COMPREHENSIVE METABOLIC PANEL - Abnormal; Notable for the following:    Potassium 3.3 (*)    Glucose, Bld 129 (*)    Total  Bilirubin 1.4 (*)    GFR calc non Af Amer 73 (*)    GFR calc Af Amer 85 (*)    All other components within normal limits  PRO B NATRIURETIC PEPTIDE - Abnormal; Notable for the following:    Pro B Natriuretic peptide (BNP) 41893.0 (*)    All other components within normal limits  TROPONIN I   Imaging Review Dg Chest 2 View  03/14/2013   CLINICAL DATA:  Cough  EXAM: CHEST  2 VIEW  COMPARISON:  Chest radiograph 03/06/2012, CT 12/26/2010  FINDINGS: Stable enlarged cardiac silhouette. There is a prominent vascular shadow superior to the right hilum unchanged. There is central venous pulmonary congestion. No focal consolidation. Trace bilateral effusions. Multiple wedge compression fractures of the thoracic spine with kyphosis are not significantly changed.  IMPRESSION: 1. Cardiomegaly, central venous pulmonary congestion, and mild effusions suggest mild congestive heart failure. 2. No focal infiltrate.   Electronically Signed   By: Genevive Bi M.D.   On: 03/14/2013 16:12   Dg Chest Portable 1 View  03/14/2013   CLINICAL DATA:  Shortness of breath  EXAM: PORTABLE CHEST - 1 VIEW  COMPARISON:  March 14, 2013, 03/06/2012  FINDINGS: Mild to moderate cardiac enlargement. Moderate vascular congestion of pulmonary venous hypertension. Tiny effusions unchanged. No pulmonary edema or consolidation appreciated.  IMPRESSION: No change when compared to radiograph performed earlier today with findings again consistent with mild congestive heart failure.   Electronically Signed   By: Edgar Frisk.D.  On: 03/14/2013 17:01    EKG Interpretation    Date/Time:  Wednesday March 14 2013 16:37:14 EST Ventricular Rate:  120 PR Interval:  166 QRS Duration: 122 QT Interval:  330 QTC Calculation: 466 R Axis:   8 Text Interpretation:  Undetermined rhythm Left bundle branch block Abnormal ECG When compared with ECG of 07-Oct-2011 11:23, Current undetermined rhythm precludes rhythm comparison, needs review T  wave inversion no longer evident in Inferior leads Nonspecific T wave abnormality no longer evident in Anterior leads T wave inversion now evident in Lateral leads Confirmed by Sherryn Pollino  MD, Cindi Ghazarian ([id number]) on 03/14/2013 7:15:39 PM            MDM  No diagnosis found. chf  The chart was scribed for me under my direct supervision.  I personally performed the history, physical, and medical decision making and all procedures in the evaluation of this patient.Benny Lennert, MD 03/14/13 [phone]

## 2013-03-14 NOTE — ED Notes (Signed)
respiratory called for neb treatment 

## 2013-03-14 NOTE — ED Notes (Signed)
MD at bedside. 

## 2013-03-15 ENCOUNTER — Inpatient Hospital Stay (HOSPITAL_COMMUNITY): Payer: Medicare Other

## 2013-03-15 DIAGNOSIS — I4729 Other ventricular tachycardia: Secondary | ICD-10-CM | POA: Diagnosis not present

## 2013-03-15 DIAGNOSIS — I82409 Acute embolism and thrombosis of unspecified deep veins of unspecified lower extremity: Secondary | ICD-10-CM

## 2013-03-15 DIAGNOSIS — I509 Heart failure, unspecified: Secondary | ICD-10-CM | POA: Diagnosis not present

## 2013-03-15 DIAGNOSIS — I5023 Acute on chronic systolic (congestive) heart failure: Principal | ICD-10-CM

## 2013-03-15 DIAGNOSIS — I5022 Chronic systolic (congestive) heart failure: Secondary | ICD-10-CM | POA: Diagnosis present

## 2013-03-15 DIAGNOSIS — I824Y9 Acute embolism and thrombosis of unspecified deep veins of unspecified proximal lower extremity: Secondary | ICD-10-CM | POA: Diagnosis not present

## 2013-03-15 DIAGNOSIS — I82403 Acute embolism and thrombosis of unspecified deep veins of lower extremity, bilateral: Secondary | ICD-10-CM | POA: Diagnosis present

## 2013-03-15 DIAGNOSIS — I369 Nonrheumatic tricuspid valve disorder, unspecified: Secondary | ICD-10-CM | POA: Diagnosis not present

## 2013-03-15 DIAGNOSIS — Z86718 Personal history of other venous thrombosis and embolism: Secondary | ICD-10-CM | POA: Diagnosis present

## 2013-03-15 DIAGNOSIS — I472 Ventricular tachycardia: Secondary | ICD-10-CM | POA: Diagnosis not present

## 2013-03-15 DIAGNOSIS — I428 Other cardiomyopathies: Secondary | ICD-10-CM | POA: Diagnosis not present

## 2013-03-15 DIAGNOSIS — J9600 Acute respiratory failure, unspecified whether with hypoxia or hypercapnia: Secondary | ICD-10-CM | POA: Diagnosis not present

## 2013-03-15 DIAGNOSIS — I5042 Chronic combined systolic (congestive) and diastolic (congestive) heart failure: Secondary | ICD-10-CM | POA: Diagnosis present

## 2013-03-15 LAB — COMPREHENSIVE METABOLIC PANEL
ALT: 7 U/L (ref 0–53)
AST: 17 U/L (ref 0–37)
Albumin: 3.4 g/dL — ABNORMAL LOW (ref 3.5–5.2)
Alkaline Phosphatase: 65 U/L (ref 39–117)
BUN: 12 mg/dL (ref 6–23)
CO2: 23 mEq/L (ref 19–32)
Calcium: 9.5 mg/dL (ref 8.4–10.5)
Chloride: 105 mEq/L (ref 96–112)
Creatinine, Ser: 0.97 mg/dL (ref 0.50–1.35)
GFR calc Af Amer: 82 mL/min — ABNORMAL LOW (ref >90)
GFR calc non Af Amer: 71 mL/min — ABNORMAL LOW (ref >90)
Glucose, Bld: 113 mg/dL — ABNORMAL HIGH (ref 70–99)
Potassium: 3.2 mEq/L — ABNORMAL LOW (ref 3.5–5.1)
Sodium: 141 mEq/L (ref 135–145)
Total Bilirubin: 0.9 mg/dL (ref 0.3–1.2)
Total Protein: 6.6 g/dL (ref 6.0–8.3)

## 2013-03-15 LAB — CBC
HCT: 32.7 % — ABNORMAL LOW (ref 39.0–52.0)
Hemoglobin: 11.3 g/dL — ABNORMAL LOW (ref 13.0–17.0)
MCH: 31.3 pg (ref 26.0–34.0)
MCHC: 34.6 g/dL (ref 30.0–36.0)
MCV: 90.6 fL (ref 78.0–100.0)
Platelets: 169 10*3/uL (ref 150–400)
RBC: 3.61 MIL/uL — ABNORMAL LOW (ref 4.22–5.81)
RDW: 16.9 % — ABNORMAL HIGH (ref 11.5–15.5)
WBC: 4.7 10*3/uL (ref 4.0–10.5)

## 2013-03-15 LAB — TROPONIN I
Troponin I: <0.3 ng/mL (ref <0.30)
Troponin I: <0.3 ng/mL (ref <0.30)

## 2013-03-15 LAB — TSH: TSH: 1.192 u[IU]/mL (ref 0.350–4.500)

## 2013-03-15 LAB — PROTIME-INR
INR: 1.19 (ref 0.00–1.49)
Prothrombin Time: 14.8 seconds (ref 11.6–15.2)

## 2013-03-15 LAB — MRSA PCR SCREENING: MRSA by PCR: POSITIVE — AB

## 2013-03-15 MED ORDER — POTASSIUM CHLORIDE CRYS ER 20 MEQ PO TBCR
40.0000 meq | EXTENDED_RELEASE_TABLET | Freq: Once | ORAL | Status: AC
  Administered 2013-03-15: 40 meq via ORAL
  Filled 2013-03-15: qty 2

## 2013-03-15 MED ORDER — SPIRONOLACTONE 25 MG PO TABS
25.0000 mg | ORAL_TABLET | Freq: Every day | ORAL | Status: DC
  Administered 2013-03-15 – 2013-03-16 (×2): 25 mg via ORAL
  Filled 2013-03-15 (×2): qty 1

## 2013-03-15 MED ORDER — WARFARIN - PHARMACIST DOSING INPATIENT: Status: DC

## 2013-03-15 MED ORDER — COUMADIN BOOK
Freq: Once | Status: DC
  Filled 2013-03-15: qty 1

## 2013-03-15 MED ORDER — WARFARIN SODIUM 2 MG PO TABS
4.0000 mg | ORAL_TABLET | Freq: Once | ORAL | Status: AC
  Administered 2013-03-15: 4 mg via ORAL
  Filled 2013-03-15: qty 2

## 2013-03-15 MED ORDER — WARFARIN VIDEO
Freq: Once | Status: AC
  Administered 2013-03-15: 20:00:00

## 2013-03-15 NOTE — Progress Notes (Signed)
UR chart review completed.  

## 2013-03-15 NOTE — Progress Notes (Addendum)
ANTICOAGULATION CONSULT NOTE  Pharmacy Consult for Lovenox & Warfarin Indication: Bilateral DVT  Allergies  Allergen Reactions  . Lipitor [Atorvastatin]     Patient Measurements: Height: 5\' 1"  (154.9 cm) Weight: 156 lb 12 oz (71.1 kg) IBW/kg (Calculated) : 52.3  Vital Signs: Temp: 98 F (36.7 C) (12/04 1432) Temp src: Oral (12/04 1432) BP: 138/69 mmHg (12/04 1432) Pulse Rate: 72 (12/04 1432)  Labs:  Recent Labs  03/14/13 1655 03/14/13 2220 03/15/13 0403 03/15/13 0955  HGB 11.8*  --  11.3*  --   HCT 34.8*  --  32.7*  --   PLT 156  --  169  --   CREATININE 0.91  --  0.97  --   TROPONINI <0.30 <0.30 <0.30 <0.30    Estimated Creatinine Clearance: 43.7 ml/min (by C-G formula based on Cr of 0.97).   Medical History: Past Medical History  Diagnosis Date  . HTN (hypertension)   . Hyperlipidemia   . PVCs (premature ventricular contractions)   . Glaucoma   . Bladder tumor   . CAD (coronary artery disease) CARDIOLOGIST- DR HOCHRIEN -- LAST VIIST [id number] IN [facility]IC    Non obstructive 25% LAD 2006  . DDD (degenerative disc disease)   . Arthritis   . PVD (peripheral vascular disease)     Right ABI .75, Left .78 (2006)  . Popliteal artery aneurysm, bilateral DECUMENTED CHRONIC PARTIAL OCCLUSION--  PT DENIES CLAUDICATION OR ANY OTHER SYMPTOMS  . Frequency of urination   . Nocturia   . Bladder cancer 03/15/2012    Medications:  Scheduled:  . aspirin EC  81 mg Oral Daily  . donepezil  10 mg Oral QHS  . enoxaparin (LOVENOX) injection  70 mg Subcutaneous Q12H  . folic acid  1 mg Oral Daily  . furosemide  60 mg Intravenous Q12H  . gabapentin  100 mg Oral BID  . irbesartan  150 mg Oral Daily  . latanoprost  1 drop Both Eyes QHS  . LORazepam  0-4 mg Oral Q6H   Followed by  . [START ON 03/16/2013] LORazepam  0-4 mg Oral Q12H  . megestrol  20 mg Oral Daily  . metoprolol succinate  50 mg Oral q morning - 10a  . multivitamin with minerals  1 tablet Oral Daily  .  oxybutynin  5 mg Oral QHS  . potassium chloride  20 mEq Oral Daily  . potassium chloride  40 mEq Oral Once  . simvastatin  10 mg Oral q1800  . sodium chloride  3 mL Intravenous Q12H  . spironolactone  25 mg Oral Daily  . tamsulosin  0.4 mg Oral Daily  . thiamine  100 mg Oral Daily   Or  . thiamine  100 mg Intravenous Daily    Assessment: 79 yo M admitted with HF exacerbation, on until treatment dose enoxaparin for bilateral DVT.   No bleeding noted.  H/H stable. Renal function is stable.  Baseline INR pending.  Goal of Therapy:  Anti-Xa level 0.6-1.2 units/ml 4hrs after LMWH dose given Monitor platelets by anticoagulation protocol: Yes INR 2-3   Plan:  Continue Lovenox 70mg  sq q12h (1mg /kg) CBC on MWF Warfarin 4mg  PO x 1 tonight (unless elevated INR). Daily PT/INR  Mady Gemma 03/15/2013,6:00 PM

## 2013-03-15 NOTE — Progress Notes (Signed)
[facility] PROGRESS NOTE  Scott [name] ZOX:[id number] DOB: May 02, 1921 DOA: 03/14/2013 PCP: [name], MD  Assessment/Plan: 1. Acute respiratory failure. Felt to be due to pulmonary edema and volume overload. Continue current treatments. He down oxygen as tolerated. 2. Acute on chronic systolic congestive heart failure. Ejection fraction is markedly reduced from prior study in 06/2012. Patient recently had cardiac catheterization earlier this year and cardiomyopathy was felt to be nonischemic. Alcohol abuse could likely be playing a role here. Cardiology is following for medication optimization. Defer any further workup if necessary to cardiology. 3. Bilateral lower extremity DVTs. Etiology is not clear. He was started on Lovenox and will need at least 3-6 months of anticoagulation. Coumadin will be started today. 4. Alcohol abuse. No signs of withdrawal at this time. Counseled on the importance of alcohol cessation. 5. Hypertension. Stable  Code Status: full code Family Communication: discussed with patient, no family present Disposition Plan: pending hospital course   Consultants:  Cardiology  Procedures: Ech- Left ventricle: Wall thickness was increased in a pattern of mild LVH. Systolic function was severely reduced. The estimated ejection fraction was in the range of 20% to 25%. Severe diffuse hypokinesis. Doppler parameters are consistent with restrictive physiology, indicative of decreased left ventricular diastolic compliance and/or increased left atrial pressure. - Aortic valve: Mildly to moderately calcified annulus. Trileaflet; moderately thickened leaflets. Trivial regurgitation. - Mitral valve: Mildly to moderately calcified annulus. Severely thickened leaflets . - Left atrium: The atrium was severely dilated. - Right ventricle: The cavity size was mildly dilated. Systolic function was mildly to moderately reduced. RV TAPSE is 1.3 cm. - Right atrium: The  atrium was moderately dilated.  o:   Antibiotics:  none  HPI/Subjective: Feels breathing is improving, denies any chest pain  Objective: Filed Vitals:   03/15/13 1432  BP: 138/69  Pulse: 72  Temp: 98 F (36.7 C)  Resp: 20    Intake/Output Summary (Last 24 hours) at 03/15/13 1728 Last data filed at 03/15/13 1433  Gross per 24 hour  Intake    952 ml  Output   1900 ml  Net   -948 ml   Filed Weights   03/14/13 2111 03/15/13 0653  Weight: 71.1 kg (156 lb 12 oz) 71.1 kg (156 lb 12 oz)    Exam:   General:  NAD  Cardiovascular: S1, S2 RRR  Respiratory: crackles at bases  Abdomen: soft, nt, nd, bs+  Musculoskeletal: 2+ edema L>R   Data Reviewed: Basic Metabolic Panel:  Recent Labs Lab 03/14/13 1655 03/14/13 2220 03/15/13 0403  NA 140  --  141  K 3.3*  --  3.2*  CL 104  --  105  CO2 21  --  23  GLUCOSE 129*  --  113*  BUN 12  --  12  CREATININE 0.91  --  0.97  CALCIUM 9.8  --  9.5  MG  --  1.7  --    Liver Function Tests:  Recent Labs Lab 03/14/13 1655 03/15/13 0403  AST 24 17  ALT 10 7  ALKPHOS 78 65  BILITOT 1.4* 0.9  PROT 7.4 6.6  ALBUMIN 3.8 3.4*   No results found for this basename: LIPASE, AMYLASE,  in the last 168 hours No results found for this basename: AMMONIA,  in the last 168 hours CBC:  Recent Labs Lab 03/14/13 1655 03/15/13 0403  WBC 5.4 4.7  NEUTROABS 4.1  --   HGB 11.8* 11.3*  HCT 34.8* 32.7*  MCV 90.6 90.6  PLT 156 169   Cardiac Enzymes:  Recent Labs Lab 03/14/13 1655 03/14/13 2220 03/15/13 0403 03/15/13 0955  TROPONINI <0.30 <0.30 <0.30 <0.30   BNP (last 3 results)  Recent Labs  03/14/13 1655  PROBNP 41893.0*   CBG: No results found for this basename: GLUCAP,  in the last 168 hours  Recent Results (from the past 240 hour(s))  MRSA PCR SCREENING     Status: Abnormal   Collection Time    03/15/13 12:17 PM      Result Value Range Status   MRSA by PCR POSITIVE (*) NEGATIVE Final   Comment:             The GeneXpert MRSA Assay (FDA     approved for NASAL specimens     only), is one component of a     comprehensive MRSA colonization     surveillance program. It is not     intended to diagnose MRSA     infection nor to guide or     monitor treatment for     MRSA infections.     RESULT CALLED TO, READ BACK BY AND VERIFIED WITH:     DICKERSON,M. AT 1444 ON 03/15/2013 BY BAUGHAM,M.     Studies: Dg Chest 2 View  03/14/2013   CLINICAL DATA:  Cough  EXAM: CHEST  2 VIEW  COMPARISON:  Chest radiograph 03/06/2012, CT 12/26/2010  FINDINGS: Stable enlarged cardiac silhouette. There is a prominent vascular shadow superior to the right hilum unchanged. There is central venous pulmonary congestion. No focal consolidation. Trace bilateral effusions. Multiple wedge compression fractures of the thoracic spine with kyphosis are not significantly changed.  IMPRESSION: 1. Cardiomegaly, central venous pulmonary congestion, and mild effusions suggest mild congestive heart failure. 2. No focal infiltrate.   Electronically Signed   By: Genevive Bi M.D.   On: 03/14/2013 16:12   US Venous Img Lower Bilateral  03/15/2013   CLINICAL DATA:  Bilateral lower extremity edema.  EXAM: BILATERAL LOWER EXTREMITY VENOUS DOPPLER ULTRASOUND  TECHNIQUE: Gray-scale sonography with graded compression, as well as color Doppler and duplex ultrasound, were performed to evaluate the deep venous system from the level of the common femoral vein through the popliteal and proximal calf veins. Spectral Doppler was utilized to evaluate flow at rest and with distal augmentation maneuvers.  COMPARISON:  Left lower extremity venous ultrasound on 12/25/2010.  FINDINGS: Thrombus within deep veins: Nonocclusive thrombus is identified in the mid segment of the superficial femoral vein of the right thigh. This is a small amount of thrombus. Other deep veins in the right lower extremity are normally patent.  More extensive DVT is identified on the  left with thrombus identified throughout the superficial femoral vein and popliteal vein. Most of this represents occlusive thrombus. Some thrombus extends into the profunda femoral vein.  Compressibility of deep veins:  Normal.  Duplex waveform respiratory phasicity:  Normal.  Duplex waveform response to augmentation:  Normal.  Venous reflux:  None visualized.  Other findings: Structure again identified in the left popliteal fossa measuring 3.6 x 2.6 x 3.2 cm. This was present on the prior ultrasound examination. This represents either a complex Baker cyst or less likely a thrombosed popliteal artery aneurysm. This does appear to be eccentric relative to the lumen of the popliteal artery.  IMPRESSION: 1. Small amount of nonocclusive thrombus in the mid femoral vein of the right thigh. 2. More extensive DVT of the left femoral vein and popliteal vein. This is largely  occlusive thrombus. 3. Heterogeneous structure of the left popliteal fossa again identified either representing a complex Baker's cyst or a thrombosed popliteal artery aneurysm.   Electronically Signed   By: Irish Lack M.D.   On: 03/15/2013 12:33   Dg Chest Portable 1 View  03/14/2013   CLINICAL DATA:  Shortness of breath  EXAM: PORTABLE CHEST - 1 VIEW  COMPARISON:  March 14, 2013, 03/06/2012  FINDINGS: Mild to moderate cardiac enlargement. Moderate vascular congestion of pulmonary venous hypertension. Tiny effusions unchanged. No pulmonary edema or consolidation appreciated.  IMPRESSION: No change when compared to radiograph performed earlier today with findings again consistent with mild congestive heart failure.   Electronically Signed   By: Esperanza Heir M.D.   On: 03/14/2013 17:01    Scheduled Meds: . aspirin EC  81 mg Oral Daily  . donepezil  10 mg Oral QHS  . enoxaparin (LOVENOX) injection  70 mg Subcutaneous Q12H  . folic acid  1 mg Oral Daily  . furosemide  60 mg Intravenous Q12H  . gabapentin  100 mg Oral BID  .  irbesartan  150 mg Oral Daily  . latanoprost  1 drop Both Eyes QHS  . LORazepam  0-4 mg Oral Q6H   Followed by  . [START ON 03/16/2013] LORazepam  0-4 mg Oral Q12H  . megestrol  20 mg Oral Daily  . metoprolol succinate  50 mg Oral q morning - 10a  . multivitamin with minerals  1 tablet Oral Daily  . oxybutynin  5 mg Oral QHS  . potassium chloride  20 mEq Oral Daily  . simvastatin  10 mg Oral q1800  . sodium chloride  3 mL Intravenous Q12H  . spironolactone  25 mg Oral Daily  . tamsulosin  0.4 mg Oral Daily  . thiamine  100 mg Oral Daily   Or  . thiamine  100 mg Intravenous Daily   Continuous Infusions:   Principal Problem:   Acute CHF (congestive heart failure) Active Problems:   HTN (hypertension)   Hyperlipidemia   PVCs (premature ventricular contractions)   Bladder cancer   Alcohol abuse   Acute on chronic systolic CHF (congestive heart failure)   DVT of lower extremity, bilateral    Time spent:    MEMON,JEHANZEB  [facility] Pager [phone]. If 7PM-7AM, please contact night-coverage at [email or web address], password [facility] 03/15/2013, 5:28 PM  LOS: 1 day

## 2013-03-15 NOTE — Progress Notes (Signed)
*  PRELIMINARY RESULTS* Echocardiogram 2D Echocardiogram has been performed.  Scott [name] 03/15/2013, 9:04 AM

## 2013-03-15 NOTE — Consult Note (Signed)
Primary cardiologist: Dr Hillis Range Consulting cardiologist: Antoine Poche  Clinical Summary Scott [name] is a 79 y.o.male with NICM prior LVEF 40% in 06/2012, cath at that time showed patent coronaries. He hasl has a history of  HTN, chronic LBBB, postural syncope related to diuretics, and EtOH abuse. He was seen by EP earlier this year after being admitted with syncope and having noted baseline LBBB and episodes of asymptomatic NSVT on telemetry. Echo at that time showed LVEF 40% with multiple WMAs, subsequent cath showed patent coronaries. Syncope was thought to be orthostatic and likely related to several medications he was on (HCTZ, tamsulosin, norvasc, metoprolol).  The patient presented to the ER 03/14/13 with cough, shortness of breath, and worsening LE edema over the last several days. No chest pain. In the ER pulse was 120, bp 169/112 with wheezing reported on exam. CXR showed cardiomegaly and pulmonary edema. EKG showed chronic LBBB with PVCs, troponins negative. TSH 1.19, K 3.2, Cr 0.97, BUN 12, and pro-BNP 41,893. He has been treated with IV lasix and is net negative approximately 900 mL.   Allergies  Allergen Reactions  . Lipitor [Atorvastatin]     Medications Scheduled Medications: . amLODipine  5 mg Oral Daily  . aspirin EC  81 mg Oral Daily  . donepezil  10 mg Oral QHS  . enoxaparin (LOVENOX) injection  70 mg Subcutaneous Q12H  . folic acid  1 mg Oral Daily  . furosemide  60 mg Intravenous Q12H  . gabapentin  100 mg Oral BID  . irbesartan  150 mg Oral Daily  . latanoprost  1 drop Both Eyes QHS  . LORazepam  0-4 mg Oral Q6H   Followed by  . [START ON 03/16/2013] LORazepam  0-4 mg Oral Q12H  . megestrol  20 mg Oral Daily  . metoprolol succinate  50 mg Oral q morning - 10a  . multivitamin with minerals  1 tablet Oral Daily  . oxybutynin  5 mg Oral QHS  . potassium chloride  20 mEq Oral Daily  . simvastatin  10 mg Oral q1800  . sodium chloride  3 mL Intravenous  Q12H  . tamsulosin  0.4 mg Oral Daily  . thiamine  100 mg Oral Daily   Or  . thiamine  100 mg Intravenous Daily     Infusions:     PRN Medications:  sodium chloride, acetaminophen, LORazepam, LORazepam, metoprolol, ondansetron (ZOFRAN) IV, sodium chloride   Past Medical History  Diagnosis Date  . HTN (hypertension)   . Hyperlipidemia   . PVCs (premature ventricular contractions)   . Glaucoma   . Bladder tumor   . CAD (coronary artery disease) CARDIOLOGIST- DR HOCHRIEN -- LAST VIIST [id number] IN EPIC    Non obstructive 25% LAD 2006  . DDD (degenerative disc disease)   . Arthritis   . PVD (peripheral vascular disease)     Right ABI .75, Left .78 (2006)  . Popliteal artery aneurysm, bilateral DECUMENTED CHRONIC PARTIAL OCCLUSION--  PT DENIES CLAUDICATION OR ANY OTHER SYMPTOMS  . Frequency of urination   . Nocturia   . Bladder cancer 03/15/2012    Past Surgical History  Procedure Laterality Date  . Transthoracic echocardiogram  09-22-2010    MODERATE LVH/ EF 55%/ GRADE I DIASTOLIC DYSFUNCTION/ AORTIC SCLEROSIS WITHOUT STENOSIS/  RV  SYSTOLIC  MILDLY REDUCED FUNCTION  . Cardiovascular stress test  10-15-2010    LOW RISK NUCLEAR STUDY/ NO EVIDENCE OF ISCHEMIA/ NORMAL EF  . Cardiac catheterization  05-11-2004  DR HOCHREIN    MINIMAL CORANARY PLAQUE/ NORMAL LVF/ EF 55%/  NON-OBSTRUCTIVE LAD 25%  . Shoulder surgery  20 YRS AGO  (APPROX. 1990'S)  . Transurethral resection of bladder tumor  10/07/2011    Procedure: TRANSURETHRAL RESECTION OF BLADDER TUMOR (TURBT);  Surgeon: Anner Crete, MD;  Location: [facility];  Service: Urology;  Laterality: N/A;  . Cystoscopy  10/07/2011    Procedure: CYSTOSCOPY;  Surgeon: Anner Crete, MD;  Location: [facility]  Service: Urology;  Laterality: N/A;  . Cystoscopy with biopsy  03/14/2012    Procedure: CYSTOSCOPY WITH BIOPSY;  Surgeon: Anner Crete, MD;  Location: [facility];  Service: Urology;  Laterality: N/A;  WITH  FULGURATION  . Transurethral resection of prostate  03/14/2012    Procedure: TRANSURETHRAL RESECTION OF THE PROSTATE WITH GYRUS INSTRUMENTS;  Surgeon: Anner Crete, MD;  Location: [facility];  Service: Urology;  Laterality: N/A;    Family History  Problem Relation Age of Onset  . Hypertension Mother   . Lung cancer Sister     Social History Scott [name] reports that he quit smoking about 37 years ago. His smoking use included Cigarettes. He started smoking about 70 years ago. He has a 20 pack-year smoking history. He has never used smokeless tobacco. Scott [name] reports that he drinks about 1.8 ounces of alcohol per week.  Review of Systems CONSTITUTIONAL: No weight loss, fever, chills, weakness or fatigue.  HEENT: Eyes: No visual loss, blurred vision, double vision or yellow sclerae. No hearing loss, sneezing, congestion, runny nose or sore throat.  SKIN: No rash or itching.  CARDIOVASCULAR: per HPI RESPIRATORY: per HPI GASTROINTESTINAL: No anorexia, nausea, vomiting or diarrhea. No abdominal pain or blood.  GENITOURINARY: no polyuria, no dysuria NEUROLOGICAL: No headache, dizziness, syncope, paralysis, ataxia, numbness or tingling in the extremities. No change in bowel or bladder control.  MUSCULOSKELETAL: No muscle, back pain, joint pain or stiffness.  HEMATOLOGIC: No anemia, bleeding or bruising.  LYMPHATICS: No enlarged nodes. No history of splenectomy.  PSYCHIATRIC: No history of depression or anxiety.      Physical Examination Blood pressure 138/69, pulse 72, temperature 98 F (36.7 C), temperature source Oral, resp. rate 20, height 5\' 1"  (1.549 m), weight 156 lb 12 oz (71.1 kg), SpO2 98.00%.  Intake/Output Summary (Last 24 hours) at 03/15/13 1459 Last data filed at 03/15/13 1433  Gross per 24 hour  Intake    952 ml  Output   1900 ml  Net   -948 ml     Cardiovascular: RRR with occasional skipped beats, no m/r/g, JVD to angle of jaw, no carotid bruits  Respiratory: crackles  bilateral bases  GI: soft, NT, ND  MSK: extremities are warm, 1-2 + bilateral LE edema L>R  Neuro: no focal deficits  Psych: appropriate affect   Lab Results  Basic Metabolic Panel:  Recent Labs Lab 03/14/13 1655 03/14/13 2220 03/15/13 0403  NA 140  --  141  K 3.3*  --  3.2*  CL 104  --  105  CO2 21  --  23  GLUCOSE 129*  --  113*  BUN 12  --  12  CREATININE 0.91  --  0.97  CALCIUM 9.8  --  9.5  MG  --  1.7  --     Liver Function Tests:  Recent Labs Lab 03/14/13 1655 03/15/13 0403  AST 24 17  ALT 10 7  ALKPHOS 78 65  BILITOT 1.4*  0.9  PROT 7.4 6.6  ALBUMIN 3.8 3.4*    CBC:  Recent Labs Lab 03/14/13 1655 03/15/13 0403  WBC 5.4 4.7  NEUTROABS 4.1  --   HGB 11.8* 11.3*  HCT 34.8* 32.7*  MCV 90.6 90.6  PLT 156 169    Cardiac Enzymes:  Recent Labs Lab 03/14/13 1655 03/14/13 2220 03/15/13 0403 03/15/13 0955  TROPONINI <0.30 <0.30 <0.30 <0.30    BNP: No components found with this basename: POCBNP,    ECG: sinsus with LBBB, PVCs   Diagnostic studies  06/2012 Echo ([facility]) LVEF 40%, multiple WMAs, grade I diastolic dysfunction, mild LAE, normal RV  03/2013 Echo: LVEF 20-25%, mild LVH, restrictive diastolic dysfunction, mildly to moderately decreased RV function.   06/2012 [facility] Procedural Findings:  Hemodynamics:  AO 100/38  LV 95/16  Coronary angiography:  Coronary dominance: right  Left mainstem: Mild luminal irregularities.  Left anterior descending (LAD): Mild luminal irregularities.  Left circumflex (LCx): Mild luminal irregularities.  Right coronary artery (RCA): No significant CAD.  Left ventriculography: EF 45-50% with mild global hypokinesis.  Final Conclusions: Mild luminal irregularities in coronaries. Mild nonischemic cardiomyopathy.      Impression/Recommendations  1. Acute on chronic systolic and diastolic heart failure - history of NICM with prior cath 06/2012 with patent coronaries, LVEF at that time was  around 40-45%. Recent echo this admission showed LVEF down to 20-25%, restrictive diastolic dysfunction - progression of LV systolic dysfunction could be related to noted EtOH use though patient denies, vs medication non-compliance vs idiopathic - evidence of significant volume overload, continue IV lasix bid dosing.  - continue beta blocker and ARB, will stop norvasc as no secondary benefit for his systolic dysfunction. Start spironolactone 25 mg daily. Once more euvolemic can consider titrating beta blocker further, though will need to be careful as patient has had history of postural syncope and dizziness, no current symptoms.      Dina Rich, M.D., F.A.C.C.

## 2013-03-16 DIAGNOSIS — M159 Polyosteoarthritis, unspecified: Secondary | ICD-10-CM | POA: Diagnosis not present

## 2013-03-16 DIAGNOSIS — I739 Peripheral vascular disease, unspecified: Secondary | ICD-10-CM | POA: Diagnosis not present

## 2013-03-16 DIAGNOSIS — I2510 Atherosclerotic heart disease of native coronary artery without angina pectoris: Secondary | ICD-10-CM | POA: Diagnosis not present

## 2013-03-16 DIAGNOSIS — I509 Heart failure, unspecified: Secondary | ICD-10-CM | POA: Diagnosis not present

## 2013-03-16 DIAGNOSIS — Z5181 Encounter for therapeutic drug level monitoring: Secondary | ICD-10-CM | POA: Diagnosis not present

## 2013-03-16 DIAGNOSIS — I10 Essential (primary) hypertension: Secondary | ICD-10-CM | POA: Diagnosis not present

## 2013-03-16 DIAGNOSIS — Z7901 Long term (current) use of anticoagulants: Secondary | ICD-10-CM | POA: Diagnosis not present

## 2013-03-16 DIAGNOSIS — I5023 Acute on chronic systolic (congestive) heart failure: Secondary | ICD-10-CM | POA: Diagnosis not present

## 2013-03-16 DIAGNOSIS — I82409 Acute embolism and thrombosis of unspecified deep veins of unspecified lower extremity: Secondary | ICD-10-CM | POA: Diagnosis not present

## 2013-03-16 LAB — MAGNESIUM: Magnesium: 1.8 mg/dL (ref 1.5–2.5)

## 2013-03-16 LAB — BASIC METABOLIC PANEL
BUN: 13 mg/dL (ref 6–23)
CO2: 29 mEq/L (ref 19–32)
Calcium: 9.7 mg/dL (ref 8.4–10.5)
Chloride: 104 mEq/L (ref 96–112)
Creatinine, Ser: 1.15 mg/dL (ref 0.50–1.35)
GFR calc Af Amer: 63 mL/min — ABNORMAL LOW (ref >90)
GFR calc non Af Amer: 54 mL/min — ABNORMAL LOW (ref >90)
Glucose, Bld: 91 mg/dL (ref 70–99)
Potassium: 3.3 mEq/L — ABNORMAL LOW (ref 3.5–5.1)
Sodium: 141 mEq/L (ref 135–145)

## 2013-03-16 LAB — PROTIME-INR
INR: 1.26 (ref 0.00–1.49)
Prothrombin Time: 15.5 seconds — ABNORMAL HIGH (ref 11.6–15.2)

## 2013-03-16 LAB — CBC
HCT: 33.4 % — ABNORMAL LOW (ref 39.0–52.0)
Hemoglobin: 11.1 g/dL — ABNORMAL LOW (ref 13.0–17.0)
MCH: 30.5 pg (ref 26.0–34.0)
MCHC: 33.2 g/dL (ref 30.0–36.0)
MCV: 91.8 fL (ref 78.0–100.0)
Platelets: 178 10*3/uL (ref 150–400)
RBC: 3.64 MIL/uL — ABNORMAL LOW (ref 4.22–5.81)
RDW: 17.1 % — ABNORMAL HIGH (ref 11.5–15.5)
WBC: 4.3 10*3/uL (ref 4.0–10.5)

## 2013-03-16 MED ORDER — WARFARIN SODIUM 5 MG PO TABS: 5.0000 mg | ORAL_TABLET | Freq: Every day | ORAL | Status: DC

## 2013-03-16 MED ORDER — METOPROLOL SUCCINATE ER 100 MG PO TB24: 100.0000 mg | ORAL_TABLET | Freq: Every morning | ORAL | Status: DC

## 2013-03-16 MED ORDER — METOPROLOL SUCCINATE ER 50 MG PO TB24
100.0000 mg | ORAL_TABLET | Freq: Every morning | ORAL | Status: DC
  Administered 2013-03-16: 100 mg via ORAL
  Filled 2013-03-16: qty 2

## 2013-03-16 MED ORDER — DULOXETINE HCL 20 MG PO CPEP: 20.0000 mg | ORAL_CAPSULE | Freq: Every day | ORAL | Status: DC

## 2013-03-16 MED ORDER — WARFARIN SODIUM 5 MG PO TABS
5.0000 mg | ORAL_TABLET | Freq: Once | ORAL | Status: AC
  Administered 2013-03-16: 5 mg via ORAL
  Filled 2013-03-16: qty 1

## 2013-03-16 MED ORDER — ENOXAPARIN SODIUM 80 MG/0.8ML ~~LOC~~ SOLN: 70.0000 mg | Freq: Two times a day (BID) | SUBCUTANEOUS | Status: DC

## 2013-03-16 MED ORDER — SPIRONOLACTONE 25 MG PO TABS: 25.0000 mg | ORAL_TABLET | Freq: Every day | ORAL | Status: DC

## 2013-03-16 NOTE — Discharge Summary (Signed)
Physician Discharge Summary  Scott [name] BJY:[id number] DOB: 1921/04/18 DOA: 03/14/2013  PCP: [name], MD  Admit date: 03/14/2013 Discharge date: 03/16/2013  Time spent: 60 minutes  Recommendations for Outpatient Follow-up:  1. Patient has been set up with home health services 2. He will need INR check for LE DVT in 3 days 3. Follow up with Dr. Christell Constant in 2 weeks 4. Follow up with cardiology in 3-4 weeks  Discharge Diagnoses:  Principal Problem:   Acute CHF (congestive heart failure) Active Problems:   HTN (hypertension)   Hyperlipidemia   PVCs (premature ventricular contractions)   Bladder cancer   Alcohol abuse   Acute on chronic systolic CHF (congestive heart failure)   DVT of lower extremity, bilateral   Discharge Condition: improved  Diet recommendation: low salt  Filed Weights   03/14/13 2111 03/15/13 0653 03/16/13 0603  Weight: 71.1 kg (156 lb 12 oz) 71.1 kg (156 lb 12 oz) 69.8 kg (153 lb 14.1 oz)    History of present illness:  Scott [name] is a 79 y.o. male with a past medical history of diastolic heart failure, hypertension, alcohol abuse, history of syncope, who was in his usual state of health till about 2 days ago, when he started having a cough, which mainly occurred at nighttime. As result of this he couldn't sleep. He was feeling short of breath. He describes the cough is mostly dry, and denies any blood in the sputum. He has had chronic leg swelling, but has been more so recently. Denies any fever or chills. His wife is a paraplegic, who was discharged from the hospital recently. She has a history of MRSA. He has been visiting her daily in the hospital. Denies any nausea, vomiting. No chest pains. He has been very fatigued over the last couple days as well. Denies any weight gain and he feels as if he may have lost some weight. Due to his symptoms he called his son. He was taken to his primary care physician today, who referred him to the emergency  department.   Hospital Course:  This patient was admitted to the hospital with shortness of breath. He was found to have acute on chronic systolic congestive heart failure. The patient was started on intravenous Lasix with good response. He has now approached euvolemia. Echocardiogram was done which showed ejection fraction of 20-25% which was significantly lower from prior study where it was noted to be between 40-45%. Patient recently had cardiac catheterization done which indicated nonobstructive coronary disease. It was felt that his cardiomyopathy is likely nonischemic in nature, likely related to ongoing alcohol use. Patient was followed by cardiology while in the hospital medications were adjusted. He was also started on spironolactone. Plans are for outpatient followup with cardiology. He was also found to have significant leg swelling and venous Dopplers indicated bilateral lower extremity DVTs. The patient has been started on Lovenox and Coumadin. Lovenox be discontinued once Coumadin is therapeutic. He does not appear to be a fall risk at this time. He is strongly advised to abstain from any further alcohol use. He has been set up with home health services to help manage his CHF as well as his anticoagulation. At the request of the patient, I discussed his case in detail with with his son [name] as well as his daughter-in-law, Dr. Laural Benes. Due to the concerns were that the patient is likely depressed due to the poor health of his wife. He has previously been on Cymbalta with good  effect but is now not on any antidepressants. They requested that he be started on low dose Cymbalta in the hopes that this may help him refrain from further alcohol intake.. This will be started here in the hospital and can be followed up by his primary care physician.  Procedures: Echo: - Left ventricle: Wall thickness was increased in a pattern of mild LVH. Systolic function was severely reduced. The estimated  ejection fraction was in the range of 20% to 25%. Severe diffuse hypokinesis. Doppler parameters are consistent with restrictive physiology, indicative of decreased left ventricular diastolic compliance and/or increased left atrial pressure. - Aortic valve: Mildly to moderately calcified annulus. Trileaflet; moderately thickened leaflets. Trivial regurgitation. - Mitral valve: Mildly to moderately calcified annulus. Severely thickened leaflets . - Left atrium: The atrium was severely dilated. - Right ventricle: The cavity size was mildly dilated. Systolic function was mildly to moderately reduced. RV TAPSE is 1.3 cm. - Right atrium: The atrium was moderately dilated.    Consultations:  Cardiology  Discharge Exam: Filed Vitals:   03/16/13 1414  BP: 143/82  Pulse: 74  Temp: 98.6 F (37 C)  Resp: 18    General: NAD Cardiovascular: S1, S2 RRR Respiratory: CTA B  Discharge Instructions  Discharge Orders   Future Appointments Provider Department Dept Phone   05/17/2013 9:30 AM Ernestina Penna, MD [place] [facility] [phone]   Future Orders Complete By Expires   (HEART FAILURE PATIENTS) Call MD:  Anytime you have any of the following symptoms: 1) 3 pound weight gain in 24 hours or 5 pounds in 1 week 2) shortness of breath, with or without a dry hacking cough 3) swelling in the hands, feet or stomach 4) if you have to sleep on extra pillows at night in order to breathe.  As directed    Diet - low sodium heart healthy  As directed    Face-to-face encounter (required for Medicare/Medicaid patients)  As directed    Comments:     I Melika Reder certify that this patient is under my care and that I, or a nurse practitioner or physician's assistant working with me, had a face-to-face encounter that meets the physician face-to-face encounter requirements with this patient on 03/16/2013. The encounter with the patient was in whole, or in part for the following medical  condition(s) which is the primary reason for home health care (List medical condition): Patient admitted with acute on chronic systolic congestive heart failure. Also has new DVTs which will need monitoring of INRs and Coumadin. Would benefit from home health RN.   Questions:     The encounter with the patient was in whole, or in part, for the following medical condition, which is the primary reason for home health care:  dvt, chf exacerbation   I certify that, based on my findings, the following services are medically necessary home health services:  Nursing   My clinical findings support the need for the above services:  Cognitive impairments, dementia, or mental confusion  that make it unsafe to leave home   Further, I certify that my clinical findings support that this patient is homebound due to:  Mental confusion   Reason for Medically Necessary Home Health Services:  Skilled Nursing- Lab Monitoring Requiring Frequent Medication Adjustment   Home Health  As directed    Comments:     Lovenox 70 mg subcutaneous twice a day INR check in 3 days with results sent to Dr. Christell Constant.   Questions:  To provide the following care/treatments:  RN   Increase activity slowly  As directed        Medication List    STOP taking these medications       amLODipine 5 MG tablet  Commonly known as:  NORVASC     diclofenac 75 MG EC tablet  Commonly known as:  VOLTAREN     megestrol 20 MG tablet  Commonly known as:  MEGACE      TAKE these medications       aspirin 81 MG tablet  Take 81 mg by mouth daily.     donepezil 10 MG disintegrating tablet  Commonly known as:  ARICEPT ODT  TAKE 1/2 TABLET DAILY AT BEDTIME     DULoxetine 20 MG capsule  Commonly known as:  CYMBALTA  Take 1 capsule (20 mg total) by mouth daily.     enoxaparin 80 MG/0.8ML injection  Commonly known as:  LOVENOX  Inject 0.7 mLs (70 mg total) into the skin every 12 (twelve) hours. For 5 days     fenofibrate 160 MG tablet   TAKE 1 TABLET DAILY     gabapentin 100 MG capsule  Commonly known as:  NEURONTIN  Take 100 mg by mouth 2 (two) times daily.     HYDROcodone-acetaminophen 10-325 MG per tablet  Commonly known as:  NORCO     KLOR-CON 10 10 MEQ tablet  Generic drug:  potassium chloride  TAKE 1 TABLET BY MOUTH EVERY DAY     LUMIGAN 0.01 % Soln  Generic drug:  bimatoprost  INSTILL 1 DROP IN EACH EYE AT BEDTIME     metoprolol succinate 100 MG 24 hr tablet  Commonly known as:  TOPROL-XL  Take 1 tablet (100 mg total) by mouth every morning.     niacin 1000 MG CR tablet  Commonly known as:  NIASPAN  TAKE 1 TABLET DAILY     olmesartan-hydrochlorothiazide 20-12.5 MG per tablet  Commonly known as:  BENICAR HCT  Take 1 tablet by mouth daily.     oxybutynin 5 MG 24 hr tablet  Commonly known as:  DITROPAN-XL  TAKE 1 TABLET DAILY     simvastatin 10 MG tablet  Commonly known as:  ZOCOR  TAKE 1 TABLET DAILY     spironolactone 25 MG tablet  Commonly known as:  ALDACTONE  Take 1 tablet (25 mg total) by mouth daily.     tamsulosin 0.4 MG Caps capsule  Commonly known as:  FLOMAX  TAKE 1 CAPSULE DAILY     warfarin 5 MG tablet  Commonly known as:  COUMADIN  Take 1 tablet (5 mg total) by mouth daily at 6 PM.       Allergies  Allergen Reactions  . Lipitor [Atorvastatin]        Follow-up Information   Follow up with [facility].   Contact information:   [address] [place] [address] [phone]      Follow up with [name], MD. Schedule an appointment as soon as possible for a visit in 2 weeks.   Specialty:  Family Medicine   Contact information:   [address] [place] [address] [phone]       Follow up with Rollene Rotunda, MD. (call for appointment in [facility] office in 3-4 weeks)    Specialty:  Cardiology   Contact information:   [address] [place] [address] [phone]  The results of  significant diagnostics from this hospitalization (including imaging, microbiology, ancillary and laboratory) are listed below for reference.    Significant Diagnostic Studies: Dg Chest 2 View  03/14/2013   CLINICAL DATA:  Cough  EXAM: CHEST  2 VIEW  COMPARISON:  Chest radiograph 03/06/2012, CT 12/26/2010  FINDINGS: Stable enlarged cardiac silhouette. There is a prominent vascular shadow superior to the right hilum unchanged. There is central venous pulmonary congestion. No focal consolidation. Trace bilateral effusions. Multiple wedge compression fractures of the thoracic spine with kyphosis are not significantly changed.  IMPRESSION: 1. Cardiomegaly, central venous pulmonary congestion, and mild effusions suggest mild congestive heart failure. 2. No focal infiltrate.   Electronically Signed   By: Genevive Bi M.D.   On: 03/14/2013 16:12   US Venous Img Lower Bilateral  03/15/2013   CLINICAL DATA:  Bilateral lower extremity edema.  EXAM: BILATERAL LOWER EXTREMITY VENOUS DOPPLER ULTRASOUND  TECHNIQUE: Gray-scale sonography with graded compression, as well as color Doppler and duplex ultrasound, were performed to evaluate the deep venous system from the level of the common femoral vein through the popliteal and proximal calf veins. Spectral Doppler was utilized to evaluate flow at rest and with distal augmentation maneuvers.  COMPARISON:  Left lower extremity venous ultrasound on 12/25/2010.  FINDINGS: Thrombus within deep veins: Nonocclusive thrombus is identified in the mid segment of the superficial femoral vein of the right thigh. This is a small amount of thrombus. Other deep veins in the right lower extremity are normally patent.  More extensive DVT is identified on the left with thrombus identified throughout the superficial femoral vein and popliteal vein. Most of this represents occlusive thrombus. Some thrombus extends into the profunda femoral vein.  Compressibility of deep veins:  Normal.   Duplex waveform respiratory phasicity:  Normal.  Duplex waveform response to augmentation:  Normal.  Venous reflux:  None visualized.  Other findings: Structure again identified in the left popliteal fossa measuring 3.6 x 2.6 x 3.2 cm. This was present on the prior ultrasound examination. This represents either a complex Baker cyst or less likely a thrombosed popliteal artery aneurysm. This does appear to be eccentric relative to the lumen of the popliteal artery.  IMPRESSION: 1. Small amount of nonocclusive thrombus in the mid femoral vein of the right thigh. 2. More extensive DVT of the left femoral vein and popliteal vein. This is largely occlusive thrombus. 3. Heterogeneous structure of the left popliteal fossa again identified either representing a complex Baker's cyst or a thrombosed popliteal artery aneurysm.   Electronically Signed   By: Irish Lack M.D.   On: 03/15/2013 12:33   Dg Chest Portable 1 View  03/14/2013   CLINICAL DATA:  Shortness of breath  EXAM: PORTABLE CHEST - 1 VIEW  COMPARISON:  March 14, 2013, 03/06/2012  FINDINGS: Mild to moderate cardiac enlargement. Moderate vascular congestion of pulmonary venous hypertension. Tiny effusions unchanged. No pulmonary edema or consolidation appreciated.  IMPRESSION: No change when compared to radiograph performed earlier today with findings again consistent with mild congestive heart failure.   Electronically Signed   By: Esperanza Heir M.D.   On: 03/14/2013 17:01    Microbiology: Recent Results (from the past 240 hour(s))  MRSA PCR SCREENING     Status: Abnormal   Collection Time    03/15/13 12:17 PM      Result Value Range Status   MRSA by PCR POSITIVE (*) NEGATIVE Final   Comment:  The GeneXpert MRSA Assay (FDA     approved for NASAL specimens     only), is one component of a     comprehensive MRSA colonization     surveillance program. It is not     intended to diagnose MRSA     infection nor to guide or      monitor treatment for     MRSA infections.     RESULT CALLED TO, READ BACK BY AND VERIFIED WITH:     DICKERSON,M. AT 1444 ON 03/15/2013 BY BAUGHAM,M.     Labs: Basic Metabolic Panel:  Recent Labs Lab 03/14/13 1655 03/14/13 2220 03/15/13 0403 03/16/13 0454  NA 140  --  141 141  K 3.3*  --  3.2* 3.3*  CL 104  --  105 104  CO2 21  --  23 29  GLUCOSE 129*  --  113* 91  BUN 12  --  12 13  CREATININE 0.91  --  0.97 1.15  CALCIUM 9.8  --  9.5 9.7  MG  --  1.7  --  1.8   Liver Function Tests:  Recent Labs Lab 03/14/13 1655 03/15/13 0403  AST 24 17  ALT 10 7  ALKPHOS 78 65  BILITOT 1.4* 0.9  PROT 7.4 6.6  ALBUMIN 3.8 3.4*   No results found for this basename: LIPASE, AMYLASE,  in the last 168 hours No results found for this basename: AMMONIA,  in the last 168 hours CBC:  Recent Labs Lab 03/14/13 1655 03/15/13 0403 03/16/13 0454  WBC 5.4 4.7 4.3  NEUTROABS 4.1  --   --   HGB 11.8* 11.3* 11.1*  HCT 34.8* 32.7* 33.4*  MCV 90.6 90.6 91.8  PLT 156 169 178   Cardiac Enzymes:  Recent Labs Lab 03/14/13 1655 03/14/13 2220 03/15/13 0403 03/15/13 0955  TROPONINI <0.30 <0.30 <0.30 <0.30   BNP: BNP (last 3 results)  Recent Labs  03/14/13 1655  PROBNP 41893.0*   CBG: No results found for this basename: GLUCAP,  in the last 168 hours     Signed:  Kelven Flater  [facility] 03/16/2013, 5:11 PM

## 2013-03-16 NOTE — Progress Notes (Addendum)
D/C instructions prescriptions, instructions, carenotes, and the coumadin book was given to the patient and his sone.  He seemed disinterested in the conversation but verbalized understanding.  Pt left the floor via w/c with staff and family in stable condition.  Prescriptions were faxed to the [facility] [place] as asked of by the patients son.

## 2013-03-16 NOTE — Progress Notes (Signed)
Patient ID: Scott [name], male   DOB: [birth year], 79 y.o.   MRN: [id number]      Subjective:   Patient reports breathing improving   Objective:   Temp:  [98 F (36.7 C)-98.4 F (36.9 C)] 98.3 F (36.8 C) (12/05 0603) Pulse Rate:  [72-83] 73 (12/05 0603) Resp:  [16-20] 16 (12/05 0603) BP: (138-159)/(69-93) 143/86 mmHg (12/05 0603) SpO2:  [98 %-100 %] 100 % (12/05 0603) Weight:  [153 lb 14.1 oz (69.8 kg)] 153 lb 14.1 oz (69.8 kg) (12/05 0603) Last BM Date: 03/13/13  Filed Weights   03/14/13 2111 03/15/13 0653 03/16/13 0603  Weight: 156 lb 12 oz (71.1 kg) 156 lb 12 oz (71.1 kg) 153 lb 14.1 oz (69.8 kg)    Intake/Output Summary (Last 24 hours) at 03/16/13 0835 Last data filed at 03/16/13 0820  Gross per 24 hour  Intake    720 ml  Output   2800 ml  Net  -2080 ml    Telemetry: sinus rhythm, PVCs, short runs of NSVT longests 7 beats  Exam:  General:NAD  Resp:CTAB  Cardiac: RRR, no m/r/g, no JVD  YN:WGNF, NT, ND  MSK: extremities warm, no edema  Neuro: no focal deficits  Psych: appropriate affect  Lab Results:  Basic Metabolic Panel:  Recent Labs Lab 03/14/13 1655 03/14/13 2220 03/15/13 0403 03/16/13 0454  NA 140  --  141 141  K 3.3*  --  3.2* 3.3*  CL 104  --  105 104  CO2 21  --  23 29  GLUCOSE 129*  --  113* 91  BUN 12  --  12 13  CREATININE 0.91  --  0.97 1.15  CALCIUM 9.8  --  9.5 9.7  MG  --  1.7  --  1.8    Liver Function Tests:  Recent Labs Lab 03/14/13 1655 03/15/13 0403  AST 24 17  ALT 10 7  ALKPHOS 78 65  BILITOT 1.4* 0.9  PROT 7.4 6.6  ALBUMIN 3.8 3.4*    CBC:  Recent Labs Lab 03/14/13 1655 03/15/13 0403 03/16/13 0454  WBC 5.4 4.7 4.3  HGB 11.8* 11.3* 11.1*  HCT 34.8* 32.7* 33.4*  MCV 90.6 90.6 91.8  PLT 156 169 178    Cardiac Enzymes:  Recent Labs Lab 03/14/13 2220 03/15/13 0403 03/15/13 0955  TROPONINI <0.30 <0.30 <0.30    BNP:  Recent Labs  03/14/13 1655  PROBNP 41893.0*     Coagulation:  Recent Labs Lab 03/15/13 1939 03/16/13 0454  INR 1.19 1.26    ECG:   Medications:   Scheduled Medications: . aspirin EC  81 mg Oral Daily  . coumadin book   Does not apply Once  . donepezil  10 mg Oral QHS  . enoxaparin (LOVENOX) injection  70 mg Subcutaneous Q12H  . folic acid  1 mg Oral Daily  . furosemide  60 mg Intravenous Q12H  . gabapentin  100 mg Oral BID  . irbesartan  150 mg Oral Daily  . latanoprost  1 drop Both Eyes QHS  . LORazepam  0-4 mg Oral Q6H   Followed by  . LORazepam  0-4 mg Oral Q12H  . megestrol  20 mg Oral Daily  . metoprolol succinate  50 mg Oral q morning - 10a  . multivitamin with minerals  1 tablet Oral Daily  . oxybutynin  5 mg Oral QHS  . potassium chloride  20 mEq Oral Daily  . simvastatin  10 mg Oral q1800  . sodium  chloride  3 mL Intravenous Q12H  . spironolactone  25 mg Oral Daily  . tamsulosin  0.4 mg Oral Daily  . thiamine  100 mg Oral Daily   Or  . thiamine  100 mg Intravenous Daily  . Warfarin - Pharmacist Dosing Inpatient   Does not apply Q24H     Infusions:     PRN Medications:  sodium chloride, acetaminophen, LORazepam, LORazepam, metoprolol, ondansetron (ZOFRAN) IV, sodium chloride     Assessment/Plan    1. Acute on chronic systolic and diastolic heart failure  - history of NICM with prior cath 06/2012 with patent coronaries, LVEF at that time was around 40-45%. Recent echo this admission showed LVEF down to 20-25%, restrictive diastolic dysfunction  - progression of LV systolic dysfunction could be related to noted EtOH use though patient denies, vs medication non-compliance vs idiopathic  - volume status improved today along with symptoms, Cr trending up suggesting his volume has decreased - will increase Toprol XL to 100mg  dail now that he is more euvolemic, change to oral lasix 40 mg daily.    2. Bilateral DVT - continue anticoagulation  3. NSVT - will titrate up beta blocker, once  medical therapy is optimized consider at that point evaluating for possible ICD  He may follow up with Dr Antoine Poche in [place] clinic in 3-4 weeks, or with Dr Wyline Mood in 3-4 weeks if he prefers to be seen in [place]. He will need a bmp in 3-4 weeks since we started spironolactone during this admission. We will sign off of inpatient care, further medication titration as outpatient.     Dina Rich, M.D., F.A.C.C.

## 2013-03-16 NOTE — Progress Notes (Addendum)
ANTICOAGULATION CONSULT NOTE  Pharmacy Consult for Lovenox --> Coumadin Indication: DVT  Allergies  Allergen Reactions  . Lipitor [Atorvastatin]     Patient Measurements: Height: 5\' 1"  (154.9 cm) Weight: 153 lb 14.1 oz (69.8 kg) IBW/kg (Calculated) : 52.3  Vital Signs: Temp: 98.3 F (36.8 C) (12/05 0603) Temp src: Oral (12/05 0603) BP: 143/86 mmHg (12/05 0603) Pulse Rate: 73 (12/05 0603)  Labs:  Recent Labs  03/14/13 1655 03/14/13 2220 03/15/13 0403 03/15/13 0955 03/15/13 1939 03/16/13 0454  HGB 11.8*  --  11.3*  --   --  11.1*  HCT 34.8*  --  32.7*  --   --  33.4*  PLT 156  --  169  --   --  178  LABPROT  --   --   --   --  14.8 15.5*  INR  --   --   --   --  1.19 1.26  CREATININE 0.91  --  0.97  --   --  1.15  TROPONINI <0.30 <0.30 <0.30 <0.30  --   --     Estimated Creatinine Clearance: 36.5 ml/min (by C-G formula based on Cr of 1.15).   Medical History: Past Medical History  Diagnosis Date  . HTN (hypertension)   . Hyperlipidemia   . PVCs (premature ventricular contractions)   . Glaucoma   . Bladder tumor   . CAD (coronary artery disease) CARDIOLOGIST- DR HOCHRIEN -- LAST VIIST [id number] IN EPIC    Non obstructive 25% LAD 2006  . DDD (degenerative disc disease)   . Arthritis   . PVD (peripheral vascular disease)     Right ABI .75, Left .78 (2006)  . Popliteal artery aneurysm, bilateral DECUMENTED CHRONIC PARTIAL OCCLUSION--  PT DENIES CLAUDICATION OR ANY OTHER SYMPTOMS  . Frequency of urination   . Nocturia   . Bladder cancer 03/15/2012    Medications:  Scheduled:  . aspirin EC  81 mg Oral Daily  . coumadin book   Does not apply Once  . donepezil  10 mg Oral QHS  . enoxaparin (LOVENOX) injection  70 mg Subcutaneous Q12H  . folic acid  1 mg Oral Daily  . furosemide  60 mg Intravenous Q12H  . gabapentin  100 mg Oral BID  . irbesartan  150 mg Oral Daily  . latanoprost  1 drop Both Eyes QHS  . LORazepam  0-4 mg Oral Q6H   Followed by  .  LORazepam  0-4 mg Oral Q12H  . megestrol  20 mg Oral Daily  . metoprolol succinate  50 mg Oral q morning - 10a  . multivitamin with minerals  1 tablet Oral Daily  . oxybutynin  5 mg Oral QHS  . potassium chloride  20 mEq Oral Daily  . simvastatin  10 mg Oral q1800  . sodium chloride  3 mL Intravenous Q12H  . spironolactone  25 mg Oral Daily  . tamsulosin  0.4 mg Oral Daily  . thiamine  100 mg Oral Daily   Or  . thiamine  100 mg Intravenous Daily  . Warfarin - Pharmacist Dosing Inpatient   Does not apply Q24H    Assessment: 79 yo M admitted with HF exacerbation and +LLE DVT. Currently on overlap day#2/5 Lovenox--> Coumadin.  No bleeding noted.  CBC reviewed.  Estimated CrCl>71ml/min, but Scr increased today.   Goal of Therapy:  Anti-Xa level 0.6-1.2 units/ml 4hrs after LMWH dose given Monitor platelets by anticoagulation protocol: Yes INR 2-3   Plan:  Lovenox  70mg  sq q12h (1mg /kg) for minimum 5 days overlap with warfarin for until INR>2 x24hrs CBC on MWF Coumadin 5mg  po x1 today Daily INR Coumadin education  Elson Clan 03/16/2013,9:08 AM

## 2013-03-16 NOTE — Care Management Note (Addendum)
    Page 1 of 2   03/16/2013     3:31:14 PM   CARE MANAGEMENT NOTE 03/16/2013  Patient:  Scott [name], Scott [name]   Account Number:  [id number]  Date Initiated:  03/16/2013  Documentation initiated by:  Sharrie Rothman  Subjective/Objective Assessment:   Pt admitted from home with CHF. Pt lives with his wife who is total care. Pt is independent with ADL's.     Action/Plan:   Pt will need to be assessed for home O2 prior to discharge. Will continue to follow for discharge planning needs.   Anticipated DC Date:  03/19/2013   Anticipated DC Plan:  HOME/SELF CARE      DC Planning Services  CM consult      [facility] Choice  HOME HEALTH   Choice offered to / List presented to:  C-1 Patient        HH arranged  HH-1 RN      [employer] agency  [facility]   Status of service:  Completed, signed off Medicare Important Message given?  YES (If response is "NO", the following Medicare IM given date fields will be blank) Date Medicare IM given:  03/16/2013 Date Additional Medicare IM given:    Discharge Disposition:  HOME/SELF CARE  Per UR Regulation:    If discussed at Long Length of Stay Meetings, dates discussed:    Comments:   03/16/13 1530 Farrell Broerman,RN BSN CM Pt discharged home today with [facility] RN for lovenox. Alroy Bailiff of [facility] is aware and will collect the pts information from the chart. HH services will start 03/17/13. No DME needs. Pt and pts nurse aware of discharge arrangements. 03/16/13 1135 Arlyss Queen, RN BSN CM

## 2013-03-17 ENCOUNTER — Telehealth: Payer: Self-pay | Admitting: Family Medicine

## 2013-03-17 NOTE — Telephone Encounter (Signed)
Returned call left message

## 2013-03-17 NOTE — Telephone Encounter (Signed)
Nurse informed that patient will need to call on Monday and that controlled substances not called in. Zynasia Burklow P. [place]esto Charon, M.D.

## 2013-03-19 ENCOUNTER — Telehealth: Payer: Self-pay | Admitting: Pharmacist

## 2013-03-19 ENCOUNTER — Telehealth: Payer: Self-pay | Admitting: Family Medicine

## 2013-03-19 NOTE — Telephone Encounter (Signed)
Patient was started on Lovenox 0.23ml (per home health nurse) every 12 hours for 5 days on 03/16/13. He was also started on warfarin 5mg  daily.  Dx:  DVT

## 2013-03-19 NOTE — Telephone Encounter (Signed)
appt given for 12/16

## 2013-03-20 ENCOUNTER — Other Ambulatory Visit: Payer: Self-pay | Admitting: Family Medicine

## 2013-03-20 ENCOUNTER — Telehealth: Payer: Self-pay | Admitting: *Deleted

## 2013-03-20 NOTE — Telephone Encounter (Signed)
Son came in to office stating that dad has an appt with [facility] on 12-16. Wants to know if he can be seen when [facility] does home visit with mother this Thursday. Please advise. If not he will keep appt for the 16th.

## 2013-03-20 NOTE — Telephone Encounter (Signed)
Recommend take 1 and 1/2 tablets of warfarin today and take last lovenox injection.  Will have home health recheck INR tomorrow 03/21/13 and give instructions.  Called Scott [name] with [facility] with instruction and order to recheck INR. Tried to call patient but he was asleep - will try to call back later.

## 2013-03-21 ENCOUNTER — Ambulatory Visit (INDEPENDENT_AMBULATORY_CARE_PROVIDER_SITE_OTHER): Payer: Medicare Other | Admitting: Pharmacist

## 2013-03-21 ENCOUNTER — Telehealth: Payer: Self-pay | Admitting: *Deleted

## 2013-03-21 DIAGNOSIS — I82403 Acute embolism and thrombosis of unspecified deep veins of lower extremity, bilateral: Secondary | ICD-10-CM

## 2013-03-21 DIAGNOSIS — M48061 Spinal stenosis, lumbar region without neurogenic claudication: Secondary | ICD-10-CM | POA: Diagnosis not present

## 2013-03-21 DIAGNOSIS — I82409 Acute embolism and thrombosis of unspecified deep veins of unspecified lower extremity: Secondary | ICD-10-CM

## 2013-03-21 LAB — POCT INR: INR: 2.2

## 2013-03-21 NOTE — Telephone Encounter (Signed)
Therapeutic anticoagulation - warfarin 5mg  1 tablet daily.  Ok to discontinue lovenox Recheck protime 03/26/13. Tina with [facility] and patient notified.

## 2013-03-21 NOTE — Telephone Encounter (Signed)
INR 2.2  Last day of Lovenox, he took 7.5 mg of Coumadin last night

## 2013-03-21 NOTE — Telephone Encounter (Signed)
Please discuss this with me and if this is possible

## 2013-03-22 NOTE — Telephone Encounter (Signed)
Home visit will not be done at this time now- pt aware to keep appt with dwm on 12/16 at 300 pm

## 2013-03-23 ENCOUNTER — Other Ambulatory Visit: Payer: Self-pay | Admitting: *Deleted

## 2013-03-23 ENCOUNTER — Ambulatory Visit (INDEPENDENT_AMBULATORY_CARE_PROVIDER_SITE_OTHER): Payer: Medicare Other | Admitting: Urology

## 2013-03-23 DIAGNOSIS — R3915 Urgency of urination: Secondary | ICD-10-CM | POA: Diagnosis not present

## 2013-03-23 DIAGNOSIS — N138 Other obstructive and reflux uropathy: Secondary | ICD-10-CM | POA: Diagnosis not present

## 2013-03-23 DIAGNOSIS — N401 Enlarged prostate with lower urinary tract symptoms: Secondary | ICD-10-CM

## 2013-03-23 DIAGNOSIS — Z8551 Personal history of malignant neoplasm of bladder: Secondary | ICD-10-CM | POA: Diagnosis not present

## 2013-03-26 ENCOUNTER — Ambulatory Visit (INDEPENDENT_AMBULATORY_CARE_PROVIDER_SITE_OTHER): Payer: Medicare Other | Admitting: Pharmacist

## 2013-03-26 DIAGNOSIS — I82403 Acute embolism and thrombosis of unspecified deep veins of lower extremity, bilateral: Secondary | ICD-10-CM

## 2013-03-26 DIAGNOSIS — I82409 Acute embolism and thrombosis of unspecified deep veins of unspecified lower extremity: Secondary | ICD-10-CM

## 2013-03-26 LAB — POCT INR: INR: 3.4

## 2013-03-26 NOTE — Telephone Encounter (Signed)
It appear the message from today 03/26/13 has been linked to a previous message that I believe has already been addressed.  I have addressed INR from today - please see anticoag note. Recheck INR Thrusday 03/29/13.

## 2013-03-26 NOTE — Progress Notes (Signed)
INR results by [facility].  Patient called at home with results and new dose instructions.  Also notified [name] w/[facility] about dose and ordered INR recheck for Thursday, December 18th.

## 2013-03-26 NOTE — Telephone Encounter (Signed)
INR 3.4  PT 40.9  Patient taking 5 mg daily  Call Archie Patten  [phone]

## 2013-03-27 ENCOUNTER — Encounter: Payer: Self-pay | Admitting: Family Medicine

## 2013-03-27 ENCOUNTER — Ambulatory Visit (INDEPENDENT_AMBULATORY_CARE_PROVIDER_SITE_OTHER): Payer: Medicare Other | Admitting: Family Medicine

## 2013-03-27 VITALS — BP 160/86 | HR 66 | Temp 98.2°F | Ht 61.0 in | Wt 158.0 lb

## 2013-03-27 DIAGNOSIS — I82409 Acute embolism and thrombosis of unspecified deep veins of unspecified lower extremity: Secondary | ICD-10-CM | POA: Diagnosis not present

## 2013-03-27 DIAGNOSIS — R0602 Shortness of breath: Secondary | ICD-10-CM | POA: Diagnosis not present

## 2013-03-27 DIAGNOSIS — R5383 Other fatigue: Secondary | ICD-10-CM | POA: Diagnosis not present

## 2013-03-27 DIAGNOSIS — I2510 Atherosclerotic heart disease of native coronary artery without angina pectoris: Secondary | ICD-10-CM | POA: Diagnosis not present

## 2013-03-27 DIAGNOSIS — R5381 Other malaise: Secondary | ICD-10-CM

## 2013-03-27 DIAGNOSIS — Z8551 Personal history of malignant neoplasm of bladder: Secondary | ICD-10-CM | POA: Diagnosis not present

## 2013-03-27 DIAGNOSIS — I509 Heart failure, unspecified: Secondary | ICD-10-CM | POA: Diagnosis not present

## 2013-03-27 DIAGNOSIS — I82403 Acute embolism and thrombosis of unspecified deep veins of lower extremity, bilateral: Secondary | ICD-10-CM

## 2013-03-27 DIAGNOSIS — R531 Weakness: Secondary | ICD-10-CM

## 2013-03-27 DIAGNOSIS — I10 Essential (primary) hypertension: Secondary | ICD-10-CM

## 2013-03-27 NOTE — Patient Instructions (Addendum)
Continue current medications. Continue good therapeutic lifestyle changes which include good diet and exercise. Fall precautions discussed with patient. Document you weight daily - and alert [place] with any unusual changes. We will have the home health nurse review your home medications and make sure they agree with the list that I am sending home with you today that we reviewed in the office Please try to weight herself daily Please monitor your blood pressures at home when possible and have the home health nurse monitor them We will arrange for your followup appointment with the cardiologist and one of our nurses will call you with this information

## 2013-03-27 NOTE — Addendum Note (Signed)
Addended by: Monica Becton on: 03/27/2013 06:13 PM   Modules accepted: Orders

## 2013-03-27 NOTE — Progress Notes (Signed)
Subjective:    Patient ID: Scott [name], male    DOB: 1921-08-28, 79 y.o.   MRN: [id number]  HPI Patient here today for hospital follow up from [facility], was diagnosed with CHF and DVT's.the patient's weight on hospital admission and was 156 pounds. On hospital discharge it was 153 pounds. He was discharged with a diagnosis of congestive heart failure and DVT. His weight today he is 158 pounds. Patient was cooperative and alert today and seemed to believe and control of his situation at home. He has a multiplicity of problems including bladder cancer heart disease DVT and high risk medication.     Patient Active Problem List   Diagnosis Date Noted  . Acute on chronic systolic CHF (congestive heart failure) 03/15/2013  . DVT of lower extremity, bilateral 03/15/2013  . Acute CHF (congestive heart failure) 03/14/2013  . Fatigue 11/02/2012  . Constipation 11/02/2012  . Alcohol abuse 08/27/2012  . Syncope 06/21/2012  . Bladder cancer 03/15/2012  . Benign localized hyperplasia of prostate with urinary obstruction 03/15/2012  . HTN (hypertension)   . Hyperlipidemia   . PVCs (premature ventricular contractions)   . CAD (coronary artery disease)    Outpatient Encounter Prescriptions as of 03/27/2013  Medication Sig  . aspirin 81 MG tablet Take 81 mg by mouth daily.   Marland Kitchen donepezil (ARICEPT ODT) 10 MG disintegrating tablet TAKE 1/2 TABLET DAILY AT BEDTIME  . DULoxetine (CYMBALTA) 20 MG capsule Take 1 capsule (20 mg total) by mouth daily.  . fenofibrate 160 MG tablet TAKE 1 TABLET DAILY  . gabapentin (NEURONTIN) 100 MG capsule Take 100 mg by mouth 2 (two) times daily.  Marland Kitchen HYDROcodone-acetaminophen (NORCO) 10-325 MG per tablet   . LUMIGAN 0.01 % SOLN INSTILL 1 DROP IN [facility] EYE AT BEDTIME  . metoprolol succinate (TOPROL-XL) 100 MG 24 hr tablet Take 1 tablet (100 mg total) by mouth every morning.  . niacin (NIASPAN) 1000 MG CR tablet TAKE 1 TABLET DAILY  . oxybutynin (DITROPAN-XL) 5 MG 24 hr  tablet TAKE 1 TABLET DAILY  . Potassium Chloride ER (KLOR-CON 10) 20 MEQ TBCR Take 20 mEq by mouth daily.  . simvastatin (ZOCOR) 10 MG tablet TAKE 1 TABLET DAILY  . spironolactone (ALDACTONE) 25 MG tablet Take 1 tablet (25 mg total) by mouth daily.  . tamsulosin (FLOMAX) 0.4 MG CAPS capsule TAKE 1 CAPSULE DAILY  . warfarin (COUMADIN) 5 MG tablet Take 1 tablet (5 mg total) by mouth daily at 6 PM.  . [DISCONTINUED] olmesartan-hydrochlorothiazide (BENICAR HCT) 20-12.5 MG per tablet Take 1 tablet by mouth daily.     Review of Systems  Constitutional: Positive for fatigue.  HENT: Negative.   Eyes: Negative.   Respiratory: Negative.   Cardiovascular: Negative.   Gastrointestinal: Negative.   Endocrine: Negative.   Genitourinary: Negative.   Musculoskeletal: Negative.   Skin: Negative.   Allergic/Immunologic: Negative.   Neurological: Negative.   Hematological: Negative.   Psychiatric/Behavioral: Negative.        Objective:   Physical Exam  Constitutional: He is oriented to person, place, and time. No distress.  Elderly and somewhat frail but alert  HENT:  Head: Normocephalic and atraumatic.  Right Ear: External ear normal.  Left Ear: External ear normal.  Nose: Nose normal.  Mouth/Throat: Oropharynx is clear and moist. No oropharyngeal exudate.  Eyes: Conjunctivae and EOM are normal. Pupils are equal, round, and reactive to light. Right eye exhibits no discharge. Left eye exhibits no discharge. No scleral icterus.  Neck: Normal range of motion. Neck supple. No thyromegaly present.  Cardiovascular: Normal rate, regular rhythm and normal heart sounds.  Exam reveals no gallop.   No murmur heard. Pulmonary/Chest: Effort normal and breath sounds normal. No respiratory distress. He has no wheezes. He has no rales.  Abdominal: Soft. Bowel sounds are normal. He exhibits no distension and no mass. There is no tenderness. There is no rebound and no guarding.  Musculoskeletal: Normal range  of motion. He exhibits no edema and no tenderness.  Lymphadenopathy:    He has no cervical adenopathy.  Neurological: He is alert and oriented to person, place, and time.  Skin: Skin is warm and dry. No rash noted. He is not diaphoretic.  Psychiatric: He has a normal mood and affect. His behavior is normal. Thought content normal.   BP 160/86  Pulse 66  Temp(Src) 98.2 F (36.8 C) (Oral)  Ht 5\' 1"  (1.549 m)  Wt 158 lb (71.668 kg)  BMI 29.87 kg/m2  The pO2 in the office today was 95%      Assessment & Plan:  1. CHF (congestive heart failure) - POCT CBC - BMP8+EGFR - Brain natriuretic peptide  2. Acute CHF (congestive heart failure)  3. DVT of lower extremity, bilateral  4. HTN (hypertension)  5. History of bladder cancer  6. CAD (coronary artery disease  7. Weakness generalized  We would like to continue his home health care due to his weakness and inability to get out on his iron. We would ask him to continue with her visits to the cardiologist and to the urologist and we will continue to work with a home health nurse on his protimes, he should always be alert to any bleeding issues.  Patient Instructions  Continue current medications. Continue good therapeutic lifestyle changes which include good diet and exercise. Fall precautions discussed with patient. Document you weight daily - and alert [place] with any unusual changes. We will have the home health nurse review your home medications and make sure they agree with the list that I am sending home with you today that we reviewed in the office Please try to weight herself daily Please monitor your blood pressures at home when possible and have the home health nurse monitor them We will arrange for your followup appointment with the cardiologist and one of our nurses will call you with this information   Nyra Capes MD

## 2013-03-28 ENCOUNTER — Other Ambulatory Visit (INDEPENDENT_AMBULATORY_CARE_PROVIDER_SITE_OTHER): Payer: Medicare Other

## 2013-03-28 DIAGNOSIS — R7989 Other specified abnormal findings of blood chemistry: Secondary | ICD-10-CM

## 2013-03-28 LAB — BMP8+EGFR
BUN/Creatinine Ratio: 15 (ref 10–22)
BUN: 15 mg/dL (ref 8–27)
CO2: 17 mmol/L — ABNORMAL LOW (ref 18–29)
Calcium: 10.1 mg/dL (ref 8.6–10.2)
Chloride: 105 mmol/L (ref 97–108)
Creatinine, Ser: 0.97 mg/dL (ref 0.76–1.27)
GFR calc Af Amer: 80 mL/min/{1.73_m2} (ref >59)
GFR calc non Af Amer: 69 mL/min/{1.73_m2} (ref >59)
Glucose: 117 mg/dL — ABNORMAL HIGH (ref 65–99)
Potassium: 4.1 mmol/L (ref 3.5–5.2)
Sodium: 141 mmol/L (ref 134–144)

## 2013-03-28 LAB — CBC WITH DIFFERENTIAL
Basophils Absolute: 0.1 10*3/uL (ref 0.0–0.2)
Basos: 1 %
Eos: 2 %
Eosinophils Absolute: 0.1 10*3/uL (ref 0.0–0.4)
HCT: 62.2 % — ABNORMAL HIGH (ref 37.5–51.0)
Hemoglobin: 20.6 g/dL (ref 12.6–17.7)
Immature Grans (Abs): 0 10*3/uL (ref 0.0–0.1)
Immature Granulocytes: 0 %
Lymphocytes Absolute: 1.5 10*3/uL (ref 0.7–3.1)
Lymphs: 31 %
MCH: 30.7 pg (ref 26.6–33.0)
MCHC: 33.1 g/dL (ref 31.5–35.7)
MCV: 93 fL (ref 79–97)
Monocytes Absolute: 0.7 10*3/uL (ref 0.1–0.9)
Monocytes: 14 %
Neutrophils Absolute: 2.6 10*3/uL (ref 1.4–7.0)
Neutrophils Relative %: 52 %
Platelets: 259 10*3/uL (ref 150–379)
RBC: 6.71 x10E6/uL — ABNORMAL HIGH (ref 4.14–5.80)
RDW: 17.2 % — ABNORMAL HIGH (ref 12.3–15.4)
WBC: 5 10*3/uL (ref 3.4–10.8)

## 2013-03-28 LAB — POCT CBC
Granulocyte percent: 67.6 %G (ref 37–80)
HCT, POC: 35.7 % — AB (ref 43.5–53.7)
Hemoglobin: 11.7 g/dL — AB (ref 14.1–18.1)
Lymph, poc: 1 (ref 0.6–3.4)
MCH, POC: 29.8 pg (ref 27–31.2)
MCHC: 32.7 g/dL (ref 31.8–35.4)
MCV: 91.1 fL (ref 80–97)
MPV: 7.6 fL (ref 0–99.8)
POC Granulocyte: 2.4 (ref 2–6.9)
POC LYMPH PERCENT: 27 %L (ref 10–50)
Platelet Count, POC: 245 10*3/uL (ref 142–424)
RBC: 3.9 M/uL — AB (ref 4.69–6.13)
RDW, POC: 16.6 %
WBC: 3.6 10*3/uL — AB (ref 4.6–10.2)

## 2013-03-28 LAB — BRAIN NATRIURETIC PEPTIDE: BNP: 2270 pg/mL — ABNORMAL HIGH (ref 0.0–100.0)

## 2013-03-28 LAB — SPECIMEN STATUS REPORT

## 2013-03-29 ENCOUNTER — Ambulatory Visit (INDEPENDENT_AMBULATORY_CARE_PROVIDER_SITE_OTHER): Payer: Medicare Other | Admitting: Pharmacist

## 2013-03-29 ENCOUNTER — Telehealth: Payer: Self-pay | Admitting: *Deleted

## 2013-03-29 DIAGNOSIS — I82409 Acute embolism and thrombosis of unspecified deep veins of unspecified lower extremity: Secondary | ICD-10-CM

## 2013-03-29 DIAGNOSIS — I82403 Acute embolism and thrombosis of unspecified deep veins of lower extremity, bilateral: Secondary | ICD-10-CM

## 2013-03-29 LAB — POCT INR: INR: 2.1

## 2013-03-29 NOTE — Telephone Encounter (Signed)
PT 25.5 INR 2.1

## 2013-03-29 NOTE — Telephone Encounter (Signed)
Called patient with warfarin dosing.  His nurse states that BP has been elevated today 170's over 90's and 101. Please call her with recommendations.

## 2013-04-02 ENCOUNTER — Telehealth: Payer: Self-pay | Admitting: *Deleted

## 2013-04-02 ENCOUNTER — Ambulatory Visit (INDEPENDENT_AMBULATORY_CARE_PROVIDER_SITE_OTHER): Payer: Medicare Other | Admitting: Pharmacist

## 2013-04-02 DIAGNOSIS — I82409 Acute embolism and thrombosis of unspecified deep veins of unspecified lower extremity: Secondary | ICD-10-CM

## 2013-04-02 DIAGNOSIS — I82403 Acute embolism and thrombosis of unspecified deep veins of lower extremity, bilateral: Secondary | ICD-10-CM

## 2013-04-02 LAB — POCT INR: INR: 2.2

## 2013-04-02 NOTE — Telephone Encounter (Signed)
Patient called with warfarin dosing instructions and Inetta Fermo called with order to recheck protime in 1 week.  See anticoagulation notes.

## 2013-04-02 NOTE — Telephone Encounter (Signed)
Protime 26.2 INR 2.2,

## 2013-04-03 ENCOUNTER — Telehealth: Payer: Self-pay | Admitting: Family Medicine

## 2013-04-03 NOTE — Telephone Encounter (Signed)
Mucinex maximum strength plain blue and white in color one twice daily for cough and congestion with a large glass of water Call patient and family member

## 2013-04-04 ENCOUNTER — Telehealth: Payer: Self-pay | Admitting: Family Medicine

## 2013-04-04 NOTE — Telephone Encounter (Signed)
Dr Moore   Please review and advise 

## 2013-04-04 NOTE — Progress Notes (Signed)
Pt notified of results

## 2013-04-04 NOTE — Telephone Encounter (Signed)
He definitely needs to stop any narcotic prescriptions that he is taking. Please have his son copy to [place] a list of all the current medication he is taking

## 2013-04-06 ENCOUNTER — Telehealth: Payer: Self-pay | Admitting: *Deleted

## 2013-04-06 MED ORDER — AMLODIPINE BESYLATE 5 MG PO TABS: 5.0000 mg | ORAL_TABLET | Freq: Every day | ORAL | Status: DC

## 2013-04-06 NOTE — Telephone Encounter (Signed)
Scott [name] from [facility] called and BP elevated it was 160/110. I spoke with Dr. Christell Constant and he said to add back amlodipine 5mg  and to have home health follow up and check his bp first of the week.  Patient son and Archie Patten at [facility] aware and will follow up with [place] the first of the week and will call the provider on call this weekend if his BP goes any higher

## 2013-04-09 ENCOUNTER — Telehealth: Payer: Self-pay | Admitting: *Deleted

## 2013-04-09 NOTE — Telephone Encounter (Signed)
Called [name] at [facility] She will notify pt

## 2013-04-09 NOTE — Telephone Encounter (Signed)
Patients INR this morning is 1.4, he alternates 2.5mg  with 5 mg daily. He took 5 mg last night. Please adjust. Also his BP was 160/98 before meds this am

## 2013-04-09 NOTE — Telephone Encounter (Signed)
Pt notified and states his cough his better . The below recommendations was informed to pt .

## 2013-04-09 NOTE — Telephone Encounter (Signed)
Change his Coumadin to 2.5 on Monday Wednesday and Friday and 5 mg all other day Recheck a pro time in 8 days

## 2013-04-10 ENCOUNTER — Inpatient Hospital Stay (HOSPITAL_COMMUNITY)
Admission: EM | Admit: 2013-04-10 | Discharge: 2013-04-19 | DRG: 175 | Disposition: A | Discharge status: Home / Self Care | Payer: Medicare Other | Attending: Internal Medicine | Admitting: Internal Medicine

## 2013-04-10 ENCOUNTER — Encounter (HOSPITAL_COMMUNITY): Payer: Self-pay | Admitting: Emergency Medicine

## 2013-04-10 ENCOUNTER — Emergency Department (HOSPITAL_COMMUNITY): Payer: Medicare Other

## 2013-04-10 ENCOUNTER — Inpatient Hospital Stay (HOSPITAL_COMMUNITY): Payer: Medicare Other

## 2013-04-10 DIAGNOSIS — I447 Left bundle-branch block, unspecified: Secondary | ICD-10-CM | POA: Diagnosis present

## 2013-04-10 DIAGNOSIS — I10 Essential (primary) hypertension: Secondary | ICD-10-CM | POA: Diagnosis present

## 2013-04-10 DIAGNOSIS — R072 Precordial pain: Secondary | ICD-10-CM | POA: Diagnosis not present

## 2013-04-10 DIAGNOSIS — I4729 Other ventricular tachycardia: Secondary | ICD-10-CM | POA: Diagnosis present

## 2013-04-10 DIAGNOSIS — I214 Non-ST elevation (NSTEMI) myocardial infarction: Secondary | ICD-10-CM | POA: Diagnosis not present

## 2013-04-10 DIAGNOSIS — I2699 Other pulmonary embolism without acute cor pulmonale: Secondary | ICD-10-CM | POA: Diagnosis not present

## 2013-04-10 DIAGNOSIS — I5023 Acute on chronic systolic (congestive) heart failure: Secondary | ICD-10-CM | POA: Diagnosis not present

## 2013-04-10 DIAGNOSIS — I82403 Acute embolism and thrombosis of unspecified deep veins of lower extremity, bilateral: Secondary | ICD-10-CM

## 2013-04-10 DIAGNOSIS — I82409 Acute embolism and thrombosis of unspecified deep veins of unspecified lower extremity: Secondary | ICD-10-CM | POA: Diagnosis present

## 2013-04-10 DIAGNOSIS — I5022 Chronic systolic (congestive) heart failure: Secondary | ICD-10-CM | POA: Diagnosis present

## 2013-04-10 DIAGNOSIS — I2510 Atherosclerotic heart disease of native coronary artery without angina pectoris: Secondary | ICD-10-CM | POA: Diagnosis present

## 2013-04-10 DIAGNOSIS — I472 Ventricular tachycardia, unspecified: Secondary | ICD-10-CM | POA: Diagnosis present

## 2013-04-10 DIAGNOSIS — I82509 Chronic embolism and thrombosis of unspecified deep veins of unspecified lower extremity: Secondary | ICD-10-CM | POA: Diagnosis present

## 2013-04-10 DIAGNOSIS — I509 Heart failure, unspecified: Secondary | ICD-10-CM

## 2013-04-10 DIAGNOSIS — R079 Chest pain, unspecified: Secondary | ICD-10-CM | POA: Diagnosis not present

## 2013-04-10 DIAGNOSIS — I517 Cardiomegaly: Secondary | ICD-10-CM | POA: Diagnosis not present

## 2013-04-10 DIAGNOSIS — R791 Abnormal coagulation profile: Secondary | ICD-10-CM | POA: Diagnosis present

## 2013-04-10 DIAGNOSIS — H409 Unspecified glaucoma: Secondary | ICD-10-CM | POA: Diagnosis present

## 2013-04-10 DIAGNOSIS — Z87891 Personal history of nicotine dependence: Secondary | ICD-10-CM

## 2013-04-10 DIAGNOSIS — R7989 Other specified abnormal findings of blood chemistry: Secondary | ICD-10-CM | POA: Diagnosis not present

## 2013-04-10 DIAGNOSIS — I428 Other cardiomyopathies: Secondary | ICD-10-CM | POA: Diagnosis present

## 2013-04-10 DIAGNOSIS — Z7901 Long term (current) use of anticoagulants: Secondary | ICD-10-CM

## 2013-04-10 DIAGNOSIS — E785 Hyperlipidemia, unspecified: Secondary | ICD-10-CM | POA: Diagnosis present

## 2013-04-10 DIAGNOSIS — Z86711 Personal history of pulmonary embolism: Secondary | ICD-10-CM | POA: Diagnosis present

## 2013-04-10 DIAGNOSIS — Z86718 Personal history of other venous thrombosis and embolism: Secondary | ICD-10-CM | POA: Diagnosis present

## 2013-04-10 DIAGNOSIS — F1010 Alcohol abuse, uncomplicated: Secondary | ICD-10-CM

## 2013-04-10 DIAGNOSIS — I5042 Chronic combined systolic (congestive) and diastolic (congestive) heart failure: Secondary | ICD-10-CM | POA: Diagnosis present

## 2013-04-10 DIAGNOSIS — R778 Other specified abnormalities of plasma proteins: Secondary | ICD-10-CM | POA: Diagnosis present

## 2013-04-10 DIAGNOSIS — C679 Malignant neoplasm of bladder, unspecified: Secondary | ICD-10-CM | POA: Diagnosis not present

## 2013-04-10 DIAGNOSIS — R0602 Shortness of breath: Secondary | ICD-10-CM | POA: Diagnosis not present

## 2013-04-10 HISTORY — DX: Ventricular tachycardia: I47.2

## 2013-04-10 HISTORY — DX: Alcohol abuse, uncomplicated: F10.10

## 2013-04-10 HISTORY — DX: Other ventricular tachycardia: I47.29

## 2013-04-10 HISTORY — DX: Syncope and collapse: R55

## 2013-04-10 LAB — POCT I-STAT TROPONIN I: Troponin i, poc: 0.17 ng/mL (ref 0.00–0.08)

## 2013-04-10 LAB — CBC WITH DIFFERENTIAL/PLATELET
Basophils Absolute: 0 10*3/uL (ref 0.0–0.1)
Basophils Relative: 0 % (ref 0–1)
Eosinophils Absolute: 0 10*3/uL (ref 0.0–0.7)
Eosinophils Relative: 1 % (ref 0–5)
HCT: 35.4 % — ABNORMAL LOW (ref 39.0–52.0)
Hemoglobin: 12 g/dL — ABNORMAL LOW (ref 13.0–17.0)
Lymphocytes Relative: 14 % (ref 12–46)
Lymphs Abs: 0.5 10*3/uL — ABNORMAL LOW (ref 0.7–4.0)
MCH: 29.8 pg (ref 26.0–34.0)
MCHC: 33.9 g/dL (ref 30.0–36.0)
MCV: 87.8 fL (ref 78.0–100.0)
Monocytes Absolute: 0.3 10*3/uL (ref 0.1–1.0)
Monocytes Relative: 9 % (ref 3–12)
Neutro Abs: 2.6 10*3/uL (ref 1.7–7.7)
Neutrophils Relative %: 76 % (ref 43–77)
Platelets: 183 10*3/uL (ref 150–400)
RBC: 4.03 MIL/uL — ABNORMAL LOW (ref 4.22–5.81)
RDW: 15.1 % (ref 11.5–15.5)
WBC: 3.4 10*3/uL — ABNORMAL LOW (ref 4.0–10.5)

## 2013-04-10 LAB — COMPREHENSIVE METABOLIC PANEL
ALT: 11 U/L (ref 0–53)
AST: 19 U/L (ref 0–37)
Albumin: 3.6 g/dL (ref 3.5–5.2)
Alkaline Phosphatase: 65 U/L (ref 39–117)
BUN: 11 mg/dL (ref 6–23)
CO2: 22 mEq/L (ref 19–32)
Calcium: 9.3 mg/dL (ref 8.4–10.5)
Chloride: 104 mEq/L (ref 96–112)
Creatinine, Ser: 0.73 mg/dL (ref 0.50–1.35)
GFR calc Af Amer: >90 mL/min (ref >90)
GFR calc non Af Amer: 80 mL/min — ABNORMAL LOW (ref >90)
Glucose, Bld: 94 mg/dL (ref 70–99)
Potassium: 4.1 mEq/L (ref 3.7–5.3)
Sodium: 140 mEq/L (ref 137–147)
Total Bilirubin: 0.6 mg/dL (ref 0.3–1.2)
Total Protein: 6.7 g/dL (ref 6.0–8.3)

## 2013-04-10 LAB — PRO B NATRIURETIC PEPTIDE: Pro B Natriuretic peptide (BNP): 10535 pg/mL — ABNORMAL HIGH (ref 0–450)

## 2013-04-10 LAB — TROPONIN I
Troponin I: 1.42 ng/mL (ref <0.30)
Troponin I: 4.49 ng/mL (ref <0.30)

## 2013-04-10 LAB — HEPARIN LEVEL (UNFRACTIONATED): Heparin Unfractionated: 0.25 IU/mL — ABNORMAL LOW (ref 0.30–0.70)

## 2013-04-10 LAB — PROTIME-INR
INR: 1.61 — ABNORMAL HIGH (ref 0.00–1.49)
Prothrombin Time: 18.7 seconds — ABNORMAL HIGH (ref 11.6–15.2)

## 2013-04-10 LAB — APTT: aPTT: 29 seconds (ref 24–37)

## 2013-04-10 MED ORDER — SODIUM CHLORIDE 0.9 % IV SOLN
250.0000 mL | INTRAVENOUS | Status: DC | PRN
  Administered 2013-04-14 – 2013-04-18 (×2): 250 mL via INTRAVENOUS

## 2013-04-10 MED ORDER — LATANOPROST 0.005 % OP SOLN
1.0000 [drp] | Freq: Every day | OPHTHALMIC | Status: DC
  Administered 2013-04-10 – 2013-04-17 (×8): 1 [drp] via OPHTHALMIC
  Filled 2013-04-10: qty 2.5

## 2013-04-10 MED ORDER — WARFARIN SODIUM 5 MG PO TABS: 5.0000 mg | ORAL_TABLET | Freq: Every day | ORAL | Status: DC

## 2013-04-10 MED ORDER — MEGESTROL ACETATE 20 MG PO TABS
20.0000 mg | ORAL_TABLET | Freq: Every day | ORAL | Status: DC
  Administered 2013-04-11 – 2013-04-15 (×5): 20 mg via ORAL
  Filled 2013-04-10 (×5): qty 1

## 2013-04-10 MED ORDER — ACETAMINOPHEN 325 MG PO TABS: 650.0000 mg | ORAL_TABLET | ORAL | Status: DC | PRN

## 2013-04-10 MED ORDER — WARFARIN SODIUM 2.5 MG PO TABS
2.5000 mg | ORAL_TABLET | Freq: Once | ORAL | Status: AC
  Administered 2013-04-11: 2.5 mg via ORAL
  Filled 2013-04-10 (×2): qty 1

## 2013-04-10 MED ORDER — ASPIRIN 300 MG RE SUPP
300.0000 mg | RECTAL | Status: AC
  Filled 2013-04-10: qty 1

## 2013-04-10 MED ORDER — SODIUM CHLORIDE 0.9 % IJ SOLN: 3.0000 mL | INTRAMUSCULAR | Status: DC | PRN

## 2013-04-10 MED ORDER — ASPIRIN EC 81 MG PO TBEC
81.0000 mg | DELAYED_RELEASE_TABLET | Freq: Every day | ORAL | Status: DC
  Administered 2013-04-11 – 2013-04-14 (×4): 81 mg via ORAL
  Filled 2013-04-10 (×4): qty 1

## 2013-04-10 MED ORDER — ALBUTEROL SULFATE (2.5 MG/3ML) 0.083% IN NEBU
5.0000 mg | INHALATION_SOLUTION | Freq: Once | RESPIRATORY_TRACT | Status: AC
  Administered 2013-04-10: 5 mg via RESPIRATORY_TRACT
  Filled 2013-04-10: qty 6

## 2013-04-10 MED ORDER — ONDANSETRON HCL 4 MG/2ML IJ SOLN: 4.0000 mg | Freq: Four times a day (QID) | INTRAMUSCULAR | Status: DC | PRN

## 2013-04-10 MED ORDER — SIMVASTATIN 10 MG PO TABS
10.0000 mg | ORAL_TABLET | Freq: Every day | ORAL | Status: DC
  Administered 2013-04-11 – 2013-04-18 (×8): 10 mg via ORAL
  Filled 2013-04-10 (×10): qty 1

## 2013-04-10 MED ORDER — IOHEXOL 350 MG/ML SOLN
80.0000 mL | Freq: Once | INTRAVENOUS | Status: AC | PRN
  Administered 2013-04-10: 80 mL via INTRAVENOUS

## 2013-04-10 MED ORDER — IPRATROPIUM BROMIDE 0.02 % IN SOLN
0.5000 mg | Freq: Once | RESPIRATORY_TRACT | Status: AC
  Administered 2013-04-10: 0.5 mg via RESPIRATORY_TRACT
  Filled 2013-04-10: qty 2.5

## 2013-04-10 MED ORDER — NITROGLYCERIN 2 % TD OINT
1.0000 [in_us] | TOPICAL_OINTMENT | Freq: Once | TRANSDERMAL | Status: AC
  Administered 2013-04-10: 1 [in_us] via TOPICAL
  Filled 2013-04-10: qty 1

## 2013-04-10 MED ORDER — AMLODIPINE BESYLATE 5 MG PO TABS
5.0000 mg | ORAL_TABLET | Freq: Every day | ORAL | Status: DC
  Administered 2013-04-11: 5 mg via ORAL
  Filled 2013-04-10 (×3): qty 1

## 2013-04-10 MED ORDER — WARFARIN - PHARMACIST DOSING INPATIENT
Freq: Every day | Status: DC
  Administered 2013-04-16: 19:00:00

## 2013-04-10 MED ORDER — HEPARIN (PORCINE) IN NACL 100-0.45 UNIT/ML-% IJ SOLN: 1000.0000 [IU]/h | INTRAMUSCULAR | Status: DC

## 2013-04-10 MED ORDER — METOPROLOL SUCCINATE ER 50 MG PO TB24
50.0000 mg | ORAL_TABLET | Freq: Every day | ORAL | Status: DC
  Administered 2013-04-11 – 2013-04-19 (×8): 50 mg via ORAL
  Filled 2013-04-10 (×9): qty 1

## 2013-04-10 MED ORDER — POTASSIUM CHLORIDE ER 10 MEQ PO TBCR
10.0000 meq | EXTENDED_RELEASE_TABLET | Freq: Every day | ORAL | Status: DC
  Administered 2013-04-11 – 2013-04-17 (×7): 10 meq via ORAL
  Filled 2013-04-10 (×7): qty 1

## 2013-04-10 MED ORDER — SODIUM CHLORIDE 0.9 % IJ SOLN
3.0000 mL | Freq: Two times a day (BID) | INTRAMUSCULAR | Status: DC
  Administered 2013-04-11 – 2013-04-17 (×11): 3 mL via INTRAVENOUS

## 2013-04-10 MED ORDER — FUROSEMIDE 10 MG/ML IJ SOLN
40.0000 mg | Freq: Two times a day (BID) | INTRAMUSCULAR | Status: AC
  Administered 2013-04-10 – 2013-04-11 (×3): 40 mg via INTRAVENOUS
  Filled 2013-04-10 (×3): qty 4

## 2013-04-10 MED ORDER — HEPARIN BOLUS VIA INFUSION: 4000.0000 [IU] | Freq: Once | INTRAVENOUS | Status: DC

## 2013-04-10 MED ORDER — HEPARIN (PORCINE) IN NACL 100-0.45 UNIT/ML-% IJ SOLN
1000.0000 [IU]/h | INTRAMUSCULAR | Status: DC
  Administered 2013-04-10: 1000 [IU]/h via INTRAVENOUS
  Filled 2013-04-10: qty 250

## 2013-04-10 MED ORDER — HEPARIN BOLUS VIA INFUSION
2000.0000 [IU] | Freq: Once | INTRAVENOUS | Status: DC
  Filled 2013-04-10: qty 2000

## 2013-04-10 MED ORDER — HYDROCODONE-ACETAMINOPHEN 10-325 MG PO TABS
1.0000 | ORAL_TABLET | Freq: Four times a day (QID) | ORAL | Status: DC | PRN
  Administered 2013-04-11: 1 via ORAL
  Filled 2013-04-10: qty 1

## 2013-04-10 MED ORDER — ASPIRIN 81 MG PO CHEW: 324.0000 mg | CHEWABLE_TABLET | ORAL | Status: AC

## 2013-04-10 MED ORDER — TAMSULOSIN HCL 0.4 MG PO CAPS
0.4000 mg | ORAL_CAPSULE | Freq: Every day | ORAL | Status: DC
  Administered 2013-04-11 – 2013-04-19 (×9): 0.4 mg via ORAL
  Filled 2013-04-10 (×9): qty 1

## 2013-04-10 MED ORDER — HEPARIN (PORCINE) IN NACL 100-0.45 UNIT/ML-% IJ SOLN
950.0000 [IU]/h | INTRAMUSCULAR | Status: DC
  Filled 2013-04-10 (×4): qty 250

## 2013-04-10 MED ORDER — NITROGLYCERIN 0.4 MG SL SUBL: 0.4000 mg | SUBLINGUAL_TABLET | SUBLINGUAL | Status: DC | PRN

## 2013-04-10 NOTE — Progress Notes (Signed)
ANTICOAGULATION CONSULT NOTE - Initial Consult  Pharmacy Consult for Heparin & Warfarin Indication: chest pain/ACS; history of BL DVT  Allergies  Allergen Reactions  . Lipitor [Atorvastatin] Other (See Comments)    unknown    Patient Measurements: Height: 5\' 7"  (170.2 cm) Weight: 160 lb (72.576 kg) IBW/kg (Calculated) : 66.1  Vital Signs: Temp: 97.7 F (36.5 C) (12/30 1236) Temp src: Oral (12/30 1236) BP: 154/95 mmHg (12/30 1630) Pulse Rate: 81 (12/30 1630)  Labs:  Recent Labs  04/10/13 1411 04/10/13 1605 04/10/13 1639  HGB 12.0*  --   --   HCT 35.4*  --   --   PLT 183  --   --   APTT  --  29  --   LABPROT  --  18.7*  --   INR  --  1.61*  --   CREATININE 0.73  --   --   TROPONINI  --   --  1.42*    Estimated Creatinine Clearance: 58.5 ml/min (by C-G formula based on Cr of 0.73).   Medical History: Past Medical History  Diagnosis Date  . HTN (hypertension)   . Hyperlipidemia   . PVCs (premature ventricular contractions)   . Glaucoma   . Bladder tumor   . CAD (coronary artery disease) CARDIOLOGIST- DR HOCHRIEN -- LAST VIIST [id number] IN EPIC    Non obstructive 25% LAD 2006  . DDD (degenerative disc disease)   . Arthritis   . PVD (peripheral vascular disease)     Right ABI .75, Left .78 (2006)  . Popliteal artery aneurysm, bilateral DECUMENTED CHRONIC PARTIAL OCCLUSION--  PT DENIES CLAUDICATION OR ANY OTHER SYMPTOMS  . Frequency of urination   . Nocturia   . Bladder cancer 03/15/2012    Medications:  Prescriptions prior to admission  Medication Sig Dispense Refill  . amLODipine (NORVASC) 5 MG tablet Take 1 tablet (5 mg total) by mouth daily.  30 tablet  0  . bimatoprost (LUMIGAN) 0.03 % ophthalmic solution Place 1 drop into both eyes at bedtime.      . donepezil (ARICEPT ODT) 10 MG disintegrating tablet Take 5 mg by mouth at bedtime.      Marland Kitchen HYDROcodone-acetaminophen (NORCO) 10-325 MG per tablet Take 1 tablet by mouth every 6 (six) hours as needed for  moderate pain.       . megestrol (MEGACE) 20 MG tablet Take 20 mg by mouth daily.      . metoprolol succinate (TOPROL-XL) 100 MG 24 hr tablet Take 50 mg by mouth daily. Take with or immediately following a meal.      . potassium chloride (K-DUR) 10 MEQ tablet Take 10 mEq by mouth daily.      . simvastatin (ZOCOR) 10 MG tablet Take 10 mg by mouth daily.      . tamsulosin (FLOMAX) 0.4 MG CAPS capsule Take 0.4 mg by mouth daily.      Marland Kitchen warfarin (COUMADIN) 5 MG tablet Take 1 tablet (5 mg total) by mouth daily at 6 PM.  30 tablet  1    Admit Complaint: 79 y.o.  male  admitted 04/10/2013 with CP. Troponin positive, history of BL DVT, CTA pending. Pharmacy consulted to dose warfarin and heparin.  PMH: BL DVT, HTN, hyperlipidemia, CAD, PVD  PTA: Warfarin 5 mg daily, except 2.5 mg M,W, & F.  Note instructions changed 12/29, decreased from 2.5 mg alternating with 5 mg daily which was started 12/22.  No INR result in the EMR from this dose adjustment.  But presumably his INR was elevated.    Assessment: Anticoagulation: VTE Prophylaxis, DVT, ACS, pending result of CTA, if positive to dose heaprin and warfarin for PE, if  Negative possible cath, on discussing with Dr. Graciela Husbands felt that INR trend is down and would be best to give small dose of warfarin tonight if CTA negative given downward trend of INR.  Cardiovascular: CAD, HF Current Weight: 160 lb (72.576 kg)  Nephrology/Urology/Electrolytes/Optho:  CrCl ~55-60 ml/min  PTA Medication Issues: Home Meds Not Ordered: donepezil  Best Practices: VTE Prophylaxis:  Heparin/warfarin  Goal of Therapy:  INR 2-3 Heparin level 0.3-0.7 units/ml Monitor platelets by anticoagulation protocol: Yes   Plan:  1. Warfarin dose tonight depending on CTA result based on discussion with Dr. Graciela Husbands. - If positive will give 7.5 mg warfarin - If negative will give 2.5 mg warfarin  2. Continue warfarin at 1000 units/hr check heparin level at 2330 3. Daily heparin  level, CBC and INR  Thank you for allowing pharmacy to be a part of this patients care team.  Lovenia Kim Pharm.D., BCPS Clinical Pharmacist 04/10/2013 7:54 PM Pager: [phone] Phone: [phone]

## 2013-04-10 NOTE — ED Provider Notes (Signed)
CSN: [id number]     Arrival date & time 04/10/13  1232 History   First MD Initiated Contact with Patient 04/10/13 1245     Chief Complaint  Patient presents with  . Chest Pain   (Consider location/radiation/quality/duration/timing/severity/associated sxs/prior Treatment) Patient is a 79 y.o. male presenting with chest pain. The history is provided by the patient (the pt states he has been having chest pain today). No language interpreter was used.  Chest Pain Pain location:  Substernal area Pain quality: aching   Pain radiates to:  Does not radiate Pain radiates to the back: no   Pain severity:  No pain (no pain presently) Onset quality:  Sudden Timing:  Intermittent Progression:  Resolved Chronicity:  Recurrent Context: not breathing   Associated symptoms: shortness of breath   Associated symptoms: no abdominal pain, no back pain, no cough, no fatigue and no headache     Past Medical History  Diagnosis Date  . HTN (hypertension)   . Hyperlipidemia   . PVCs (premature ventricular contractions)   . Glaucoma   . Bladder tumor   . CAD (coronary artery disease) CARDIOLOGIST- DR HOCHRIEN -- LAST VIIST [id number] IN EPIC    Non obstructive 25% LAD 2006  . DDD (degenerative disc disease)   . Arthritis   . PVD (peripheral vascular disease)     Right ABI .75, Left .78 (2006)  . Popliteal artery aneurysm, bilateral DECUMENTED CHRONIC PARTIAL OCCLUSION--  PT DENIES CLAUDICATION OR ANY OTHER SYMPTOMS  . Frequency of urination   . Nocturia   . Bladder cancer 03/15/2012   Past Surgical History  Procedure Laterality Date  . Transthoracic echocardiogram  09-22-2010    MODERATE LVH/ EF 55%/ GRADE I DIASTOLIC DYSFUNCTION/ AORTIC SCLEROSIS WITHOUT STENOSIS/  RV  SYSTOLIC  MILDLY REDUCED FUNCTION  . Cardiovascular stress test  10-15-2010    LOW RISK NUCLEAR STUDY/ NO EVIDENCE OF ISCHEMIA/ NORMAL EF  . Cardiac catheterization  05-11-2004  [facility]    MINIMAL CORANARY PLAQUE/ NORMAL LVF/  EF 55%/  NON-OBSTRUCTIVE LAD 25%  . Shoulder surgery  20 YRS AGO  (APPROX. 1990'S)  . Transurethral resection of bladder tumor  10/07/2011    Procedure: TRANSURETHRAL RESECTION OF BLADDER TUMOR (TURBT);  Surgeon: Anner Crete, MD;  Location: [facility];  Service: Urology;  Laterality: N/A;  . Cystoscopy  10/07/2011    Procedure: CYSTOSCOPY;  Surgeon: Anner Crete, MD;  Location: [facility];  Service: Urology;  Laterality: N/A;  . Cystoscopy with biopsy  03/14/2012    Procedure: CYSTOSCOPY WITH BIOPSY;  Surgeon: Anner Crete, MD;  Location: [facility];  Service: Urology;  Laterality: N/A;  WITH FULGURATION  . Transurethral resection of prostate  03/14/2012    Procedure: TRANSURETHRAL RESECTION OF THE PROSTATE WITH GYRUS INSTRUMENTS;  Surgeon: Anner Crete, MD;  Location: [facility];  Service: Urology;  Laterality: N/A;   Family History  Problem Relation Age of Onset  . Hypertension Mother   . Lung cancer Sister    History  Substance Use Topics  . Smoking status: Former Smoker -- 1.00 packs/day for 20 years    Types: Cigarettes    Start date: 04/12/1942    Quit date: 04/13/1975  . Smokeless tobacco: Never Used  . Alcohol Use: 1.8 oz/week    3 Shots of liquor per week    Review of Systems  Constitutional: Negative for appetite change and fatigue.  HENT: Negative for congestion, ear discharge and sinus pressure.  Eyes: Negative for discharge.  Respiratory: Positive for shortness of breath. Negative for cough.   Cardiovascular: Positive for chest pain.  Gastrointestinal: Negative for abdominal pain and diarrhea.  Genitourinary: Negative for frequency and hematuria.  Musculoskeletal: Negative for back pain.  Skin: Negative for rash.  Neurological: Negative for seizures and headaches.  Psychiatric/Behavioral: Negative for hallucinations.    Allergies  Lipitor  Home Medications   Current Outpatient Rx  Name  Route  Sig  Dispense  Refill  . amLODipine  (NORVASC) 5 MG tablet   Oral   Take 1 tablet (5 mg total) by mouth daily.   30 tablet   0   . bimatoprost (LUMIGAN) 0.03 % ophthalmic solution   Both Eyes   Place 1 drop into both eyes at bedtime.         . donepezil (ARICEPT ODT) 10 MG disintegrating tablet   Oral   Take 5 mg by mouth at bedtime.         Marland Kitchen HYDROcodone-acetaminophen (NORCO) 10-325 MG per tablet   Oral   Take 1 tablet by mouth every 6 (six) hours as needed for moderate pain.          . megestrol (MEGACE) 20 MG tablet   Oral   Take 20 mg by mouth daily.         . metoprolol succinate (TOPROL-XL) 100 MG 24 hr tablet   Oral   Take 50 mg by mouth daily. Take with or immediately following a meal.         . potassium chloride (K-DUR) 10 MEQ tablet   Oral   Take 10 mEq by mouth daily.         . simvastatin (ZOCOR) 10 MG tablet   Oral   Take 10 mg by mouth daily.         . tamsulosin (FLOMAX) 0.4 MG CAPS capsule   Oral   Take 0.4 mg by mouth daily.         Marland Kitchen warfarin (COUMADIN) 5 MG tablet   Oral   Take 1 tablet (5 mg total) by mouth daily at 6 PM.   30 tablet   1    BP 154/86  Pulse 87  Temp(Src) 97.7 F (36.5 C) (Oral)  Resp 27  Ht 5\' 7"  (1.702 m)  Wt 160 lb (72.576 kg)  BMI 25.05 kg/m2  SpO2 97% Physical Exam  Constitutional: He is oriented to person, place, and time. He appears well-developed.  HENT:  Head: Normocephalic.  Eyes: Conjunctivae and EOM are normal. No scleral icterus.  Neck: Neck supple. No thyromegaly present.  Cardiovascular: Normal rate and regular rhythm.  Exam reveals no gallop and no friction rub.   No murmur heard. Pulmonary/Chest: No stridor. He has no wheezes. He has no rales. He exhibits no tenderness.  Abdominal: He exhibits no distension. There is no tenderness. There is no rebound.  Musculoskeletal: Normal range of motion. He exhibits no edema.  Lymphadenopathy:    He has no cervical adenopathy.  Neurological: He is oriented to person, place, and  time. He exhibits normal muscle tone. Coordination normal.  Skin: No rash noted. No erythema.  Psychiatric: He has a normal mood and affect. His behavior is normal.    ED Course  Procedures (including critical care time) Labs Review Labs Reviewed  CBC WITH DIFFERENTIAL - Abnormal; Notable for the following:    WBC 3.4 (*)    RBC 4.03 (*)    Hemoglobin 12.0 (*)  HCT 35.4 (*)    Lymphs Abs 0.5 (*)    All other components within normal limits  COMPREHENSIVE METABOLIC PANEL - Abnormal; Notable for the following:    GFR calc non Af Amer 80 (*)    All other components within normal limits  PRO B NATRIURETIC PEPTIDE - Abnormal; Notable for the following:    Pro B Natriuretic peptide (BNP) 10535.0 (*)    All other components within normal limits  POCT I-STAT TROPONIN I - Abnormal; Notable for the following:    Troponin i, poc 0.17 (*)    All other components within normal limits   Imaging Review Dg Chest Port 1 View  04/10/2013   CLINICAL DATA:  Shortness of Breath  EXAM: PORTABLE CHEST - 1 VIEW  COMPARISON:  03/14/2013  FINDINGS: Cardiomegaly again noted. Again noted mild congestion/pulmonary edema. No focal infiltrate.  IMPRESSION: Again noted mild congestion/pulmonary edema. No segmental infiltrate.   Electronically Signed   By: Natasha Mead M.D.   On: 04/10/2013 13:16    EKG Interpretation    Date/Time:  Tuesday April 10 2013 12:33:39 EST Ventricular Rate:  93 PR Interval:  208 QRS Duration: 133 QT Interval:  378 QTC Calculation: 470 R Axis:   1 Text Interpretation:  Sinus rhythm Borderline prolonged PR interval Probable LVH with secondary repol abnrm ST elevation, consider inferior injury Artifact in lead(s) V4 V5 Confirmed by Shajuana Mclucas  MD, Camarie Mctigue ([id number]) on 04/10/2013 3:44:11 PM          CRITICAL CARE Performed by: Parley Pidcock L Total critical care time: 35 Critical care time was exclusive of separately billable procedures and treating other patients. Critical  care was necessary to treat or prevent imminent or life-threatening deterioration. Critical care was time spent personally by me on the following activities: development of treatment plan with patient and/or surrogate as well as nursing, discussions with consultants, evaluation of patient's response to treatment, examination of patient, obtaining history from patient or surrogate, ordering and performing treatments and interventions, ordering and review of laboratory studies, ordering and review of radiographic studies, pulse oximetry and re-evaluation of patient's condition.   MDM  Non stemi mi    Benny Lennert, MD 04/10/13 [phone]

## 2013-04-10 NOTE — ED Notes (Signed)
IV team called to attempt IV access.

## 2013-04-10 NOTE — ED Notes (Signed)
Heparin held until PT INR results obtained per Dr. Graciela Husbands.

## 2013-04-10 NOTE — ED Notes (Signed)
Pt's daughter Dr. Andy Gauss  Phone # [phone]  Or  [phone]

## 2013-04-10 NOTE — Progress Notes (Signed)
Unit CM UR Completed by [facility] ED CM  W. Jaela Yepez RN  

## 2013-04-10 NOTE — ED Notes (Signed)
Pt arrives to ed via [facility] for CP onset 0400.  Pt given 4 81 mg asa and 1-0.4 sl Nitro by ems.  Pt sts CP 2/10.  Pt denies cardiac HX. Pt caox4, pmsx4, nad.  Pt denies recent illness/injury.

## 2013-04-10 NOTE — ED Notes (Signed)
Dr. Alberteen Spindle in to assess pt for admission

## 2013-04-10 NOTE — ED Notes (Signed)
Pt resting, Denies any chest pain at this time.  Skin warm and dry color appropriate

## 2013-04-10 NOTE — ED Notes (Signed)
Results of troponin shown to Dr. Estell Harpin

## 2013-04-10 NOTE — H&P (Signed)
ELECTROPHYSIOLOGY CONSULT NOTE  Patient ID: Scott [name], MRN: [id number], DOB/AGE: [birth year] 77 y.o. Admit date: 04/10/2013 Date of Consult: 04/10/2013  Primary Physician: [name], MD Primary Cardiologist: DM  Chief Complaint:  Chest pain +Tn   HPI Scott [name] is a 80 y.o. male   Seen in the emergency room because of chest pain and positive troponins.  He is an elderly gentleman with no known coronary artery disease, catheterization without obstruction 3/14, was hospitalized earlier this month at [facility] because of chest pain or shortness of breath. At that time he was noted to have intercurrent worsening of LV systolic function from 45--25%. He was also noted to have bilateral DVT and Coumadin was initiated.  He comes in today because of chest discomfort which he describes as midsternal with radiation to the shoulders associated with shortness of breath. He's had similar discomfort in the past. He's had significant bilateral edema but is not sure how much worse it is since he was hospitalized earlier this month. He's had become been coughing for the last 48-72 hours; this is been unassociated with fever. He notes that he coughs whenever he gets a cold.       Past Medical History  Diagnosis Date  . HTN (hypertension)   . Hyperlipidemia   . PVCs (premature ventricular contractions)   . Glaucoma   . Bladder tumor   . CAD (coronary artery disease) CARDIOLOGIST- DR HOCHRIEN -- LAST VIIST [id number] IN EPIC    Non obstructive 25% LAD 2006  . DDD (degenerative disc disease)   . Arthritis   . PVD (peripheral vascular disease)     Right ABI .75, Left .78 (2006)  . Popliteal artery aneurysm, bilateral DECUMENTED CHRONIC PARTIAL OCCLUSION--  PT DENIES CLAUDICATION OR ANY OTHER SYMPTOMS  . Frequency of urination   . Nocturia   . Bladder cancer 03/15/2012      Surgical History:  Past Surgical History  Procedure Laterality Date  . Transthoracic echocardiogram   09-22-2010    MODERATE LVH/ EF 55%/ GRADE I DIASTOLIC DYSFUNCTION/ AORTIC SCLEROSIS WITHOUT STENOSIS/  RV  SYSTOLIC  MILDLY REDUCED FUNCTION  . Cardiovascular stress test  10-15-2010    LOW RISK NUCLEAR STUDY/ NO EVIDENCE OF ISCHEMIA/ NORMAL EF  . Cardiac catheterization  05-11-2004  DR [facility]    MINIMAL CORANARY PLAQUE/ NORMAL LVF/ EF 55%/  NON-OBSTRUCTIVE LAD 25%  . Shoulder surgery  20 YRS AGO  (APPROX. 1990'S)  . Transurethral resection of bladder tumor  10/07/2011    Procedure: TRANSURETHRAL RESECTION OF BLADDER TUMOR (TURBT);  Surgeon: Anner Crete, MD;  Location: [facility];  Service: Urology;  Laterality: N/A;  . Cystoscopy  10/07/2011    Procedure: CYSTOSCOPY;  Surgeon: Anner Crete, MD;  Location: [facility];  Service: Urology;  Laterality: N/A;  . Cystoscopy with biopsy  03/14/2012    Procedure: CYSTOSCOPY WITH BIOPSY;  Surgeon: Anner Crete, MD;  Location: [facility];  Service: Urology;  Laterality: N/A;  WITH FULGURATION  . Transurethral resection of prostate  03/14/2012    Procedure: TRANSURETHRAL RESECTION OF THE PROSTATE WITH GYRUS INSTRUMENTS;  Surgeon: Anner Crete, MD;  Location: [facility];  Service: Urology;  Laterality: N/A;     Home Meds: Prior to Admission medications   Medication Sig Start Date End Date Taking? Authorizing Provider  amLODipine (NORVASC) 5 MG tablet Take 1 tablet (5 mg total) by mouth daily. 04/06/13  Yes Ernestina Penna, MD  bimatoprost (LUMIGAN) 0.03 % ophthalmic solution Place 1 drop into both eyes at bedtime.   Yes Historical Provider, MD  donepezil (ARICEPT ODT) 10 MG disintegrating tablet Take 5 mg by mouth at bedtime.   Yes Historical Provider, MD  HYDROcodone-acetaminophen (NORCO) 10-325 MG per tablet Take 1 tablet by mouth every 6 (six) hours as needed for moderate pain.  10/17/12  Yes Historical Provider, MD  megestrol (MEGACE) 20 MG tablet Take 20 mg by mouth daily.   Yes Historical Provider, MD  metoprolol succinate  (TOPROL-XL) 100 MG 24 hr tablet Take 50 mg by mouth daily. Take with or immediately following a meal.   Yes Historical Provider, MD  potassium chloride (K-DUR) 10 MEQ tablet Take 10 mEq by mouth daily.   Yes Historical Provider, MD  simvastatin (ZOCOR) 10 MG tablet Take 10 mg by mouth daily.   Yes Historical Provider, MD  tamsulosin (FLOMAX) 0.4 MG CAPS capsule Take 0.4 mg by mouth daily.   Yes Historical Provider, MD  warfarin (COUMADIN) 5 MG tablet Take 1 tablet (5 mg total) by mouth daily at 6 PM. 03/16/13  Yes Erick Blinks, MD    Allergies:  Allergies  Allergen Reactions  . Lipitor [Atorvastatin] Other (See Comments)    unknown    History   Social History  . Marital Status: Married    Spouse Name: N/A    Number of Children: 1  . Years of Education: N/A   Occupational History  . Retired    Social History Main Topics  . Smoking status: Former Smoker -- 1.00 packs/day for 20 years    Types: Cigarettes    Start date: 04/12/1942    Quit date: 04/13/1975  . Smokeless tobacco: Never Used  . Alcohol Use: 1.8 oz/week    3 Shots of liquor per week  . Drug Use: No  . Sexual Activity: Not on file   Other Topics Concern  . Not on file   Social History Narrative   Lives in [place] with spouse who is s/p spinal cord injury.  He has one grown son who lives in [place].  He is retired but worked for [employer] for years and lives in [place].     Family History  Problem Relation Age of Onset  . Hypertension Mother   . Lung cancer Sister      ROS:  Please see the history of present illness.     All other systems reviewed and negative.    Physical Exam:    Blood pressure 154/86, pulse 87, temperature 97.7 F (36.5 C), temperature source Oral, resp. rate 27, height 5\' 7"  (1.702 m), weight 160 lb (72.576 kg), SpO2 97.00%. General: Well developed, well nourished elderly African American younger than age appearing male in no acute distress. Head: Frontal wasting,  atraumatic, sclera non-icteric, no xanthomas, nares are without discharge. EENT: normal Lymph Nodes:  none Back: without scoliosis/kyphosis , no CVA tendersness Neck: Negative for carotid bruits. JVD >10 +HJR. Lungs: Bilateral rales half the way up  Heart: RRR with S1 S2.  2/6 systolic murmur , rubs, or gallops appreciated. Abdomen: Soft, non-tender, non-distended with normoactive bowel sounds. No hepatomegaly. No rebound/guarding. No obvious abdominal masses. Msk:  Strength and tone appear normal for age. Extremities: No clubbing or cyanosis. 3+ edema.  Distal pedal pulses are 2+ and equal bilaterally. Skin: Warm and Dry Neuro: Alert and oriented X 3. CN III-XII intact Grossly normal sensory and motor function . Psych:  Responds to questions appropriately  with a normal affect.      Labs: Cardiac Enzymes No results found for this basename: CKTOTAL, CKMB, TROPONINI,  in the last 72 hours CBC Lab Results  Component Value Date   WBC 3.4* 04/10/2013   HGB 12.0* 04/10/2013   HCT 35.4* 04/10/2013   MCV 87.8 04/10/2013   PLT 183 04/10/2013   PROTIME: No results found for this basename: LABPROT, INR,  in the last 72 hours Chemistry  Recent Labs Lab 04/10/13 1411  NA 140  K 4.1  CL 104  CO2 22  BUN 11  CREATININE 0.73  CALCIUM 9.3  PROT 6.7  BILITOT 0.6  ALKPHOS 65  ALT 11  AST 19  GLUCOSE 94   Lipids Lab Results  Component Value Date   CHOL 132 11/02/2012   HDL 34* 02/12/2013   LDLCALC 23 02/12/2013   TRIG 300* 02/12/2013   BNP Pro B Natriuretic peptide (BNP)  Date/Time Value Range Status  04/10/2013  2:11 PM 10535.0* 0 - 450 pg/mL Final  03/14/2013  4:55 PM 41893.0* 0 - 450 pg/mL Final   Miscellaneous No results found for this basename: DDIMER    Radiology/Studies:  Dg Chest 2 View  03/14/2013   CLINICAL DATA:  Cough  EXAM: CHEST  2 VIEW  COMPARISON:  Chest radiograph 03/06/2012, CT 12/26/2010  FINDINGS: Stable enlarged cardiac silhouette. There is a prominent  vascular shadow superior to the right hilum unchanged. There is central venous pulmonary congestion. No focal consolidation. Trace bilateral effusions. Multiple wedge compression fractures of the thoracic spine with kyphosis are not significantly changed.  IMPRESSION: 1. Cardiomegaly, central venous pulmonary congestion, and mild effusions suggest mild congestive heart failure. 2. No focal infiltrate.   Electronically Signed   By: Genevive Bi M.D.   On: 03/14/2013 16:12   US Venous Img Lower Bilateral  03/15/2013   CLINICAL DATA:  Bilateral lower extremity edema.  EXAM: BILATERAL LOWER EXTREMITY VENOUS DOPPLER ULTRASOUND  TECHNIQUE: Gray-scale sonography with graded compression, as well as color Doppler and duplex ultrasound, were performed to evaluate the deep venous system from the level of the common femoral vein through the popliteal and proximal calf veins. Spectral Doppler was utilized to evaluate flow at rest and with distal augmentation maneuvers.  COMPARISON:  Left lower extremity venous ultrasound on 12/25/2010.  FINDINGS: Thrombus within deep veins: Nonocclusive thrombus is identified in the mid segment of the superficial femoral vein of the right thigh. This is a small amount of thrombus. Other deep veins in the right lower extremity are normally patent.  More extensive DVT is identified on the left with thrombus identified throughout the superficial femoral vein and popliteal vein. Most of this represents occlusive thrombus. Some thrombus extends into the profunda femoral vein.  Compressibility of deep veins:  Normal.  Duplex waveform respiratory phasicity:  Normal.  Duplex waveform response to augmentation:  Normal.  Venous reflux:  None visualized.  Other findings: Structure again identified in the left popliteal fossa measuring 3.6 x 2.6 x 3.2 cm. This was present on the prior ultrasound examination. This represents either a complex Baker cyst or less likely a thrombosed popliteal artery  aneurysm. This does appear to be eccentric relative to the lumen of the popliteal artery.  IMPRESSION: 1. Small amount of nonocclusive thrombus in the mid femoral vein of the right thigh. 2. More extensive DVT of the left femoral vein and popliteal vein. This is largely occlusive thrombus. 3. Heterogeneous structure of the left popliteal fossa again identified either  representing a complex Baker's cyst or a thrombosed popliteal artery aneurysm.   Electronically Signed   By: Irish Lack M.D.   On: 03/15/2013 12:33   Dg Chest Port 1 View  04/10/2013   CLINICAL DATA:  Shortness of Breath  EXAM: PORTABLE CHEST - 1 VIEW  COMPARISON:  03/14/2013  FINDINGS: Cardiomegaly again noted. Again noted mild congestion/pulmonary edema. No focal infiltrate.  IMPRESSION: Again noted mild congestion/pulmonary edema. No segmental infiltrate.   Electronically Signed   By: Natasha Mead M.D.   On: 04/10/2013 13:16   Dg Chest Portable 1 View  03/14/2013   CLINICAL DATA:  Shortness of breath  EXAM: PORTABLE CHEST - 1 VIEW  COMPARISON:  March 14, 2013, 03/06/2012  FINDINGS: Mild to moderate cardiac enlargement. Moderate vascular congestion of pulmonary venous hypertension. Tiny effusions unchanged. No pulmonary edema or consolidation appreciated.  IMPRESSION: No change when compared to radiograph performed earlier today with findings again consistent with mild congestive heart failure.   Electronically Signed   By: Esperanza Heir M.D.   On: 03/14/2013 17:01    EKG: NSR with repol abnormality without change from 12/14  Assessment and Plan:   + Tn  Cath nonobstructive 3/14  CHF  A/C systolic  EF 25% earlier this month;  BNP 10,000 down from 40,000   DVT B Lower ext 12/14>>coumadin  INR 2.2 12/22  SOB   The patient presents with chest pain and worsening shortness of breath.  His positive troponin I suspect is not ischemic in origin given his negative catheterization 9 months ago and may be related to heart failure.  Another possibility would be pulmonary embolism not withstanding adequate anticoagulation (INR is pending today that was normal one week ago)  For now I would undertake aggressive diuresis and stop amlodipine as this may be contributing at least in part   we'll check a troponin for verification as this will have a major impact on evaluation. If the troponin is normal, I would simply undertake diuresis and continue current anticoagulation without ischemic evaluation. If the troponin is in fact abnormal, I would undertake a CT scan to exclude pulmonary embolism not withstanding anticoagulation and if this is normal would proceed with catheterization. For now I would continue heparin if the troponins are positive.  Sherryl Manges

## 2013-04-10 NOTE — ED Notes (Signed)
Updated report given to [facility] on 3W.  Pt transported to [phone]

## 2013-04-10 NOTE — Progress Notes (Addendum)
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 Lipitor [Atorvastatin] Other (See Comments)    unknown    Patient Measurements: Height: 5\' 7"  (170.2 cm) Weight: 160 lb (72.576 kg) IBW/kg (Calculated) : 66.1  Vital Signs: Temp: 97.8 F (36.6 C) (12/30 2146) Temp src: Oral (12/30 1236) BP: 143/77 mmHg (12/30 2146) Pulse Rate: 83 (12/30 2146)  Labs:  Recent Labs  04/10/13 1411 04/10/13 1605 04/10/13 1639 04/10/13 2245  HGB 12.0*  --   --   --   HCT 35.4*  --   --   --   PLT 183  --   --   --   APTT  --  29  --   --   LABPROT  --  18.7*  --   --   INR  --  1.61*  --   --   HEPARINUNFRC  --   --   --  0.25*  CREATININE 0.73  --   --   --   TROPONINI  --   --  1.42*  --     Estimated Creatinine Clearance: 58.5 ml/min (by C-G formula based on Cr of 0.73).   Admit Complaint: 79 y.o. male with chest pain for heparin.  Awaiting Chest CT to r/o PE.  INR subtherapeutic on admission.  Goal of Therapy:  INR 2-3 Heparin level 0.3-0.7 units/ml Monitor platelets by anticoagulation protocol: Yes   Plan:  Increase Heparin 1100 units/hr Will go ahead and give Coumadin 2.5 mg tonight.  If Chest CT able to be done and positive for PE, will give additional 5 mg. Follow-up am labs.  Geannie Risen, PharmD, BCPS

## 2013-04-11 DIAGNOSIS — I10 Essential (primary) hypertension: Secondary | ICD-10-CM | POA: Diagnosis not present

## 2013-04-11 DIAGNOSIS — I5023 Acute on chronic systolic (congestive) heart failure: Secondary | ICD-10-CM | POA: Diagnosis not present

## 2013-04-11 DIAGNOSIS — I509 Heart failure, unspecified: Secondary | ICD-10-CM | POA: Diagnosis not present

## 2013-04-11 DIAGNOSIS — I82409 Acute embolism and thrombosis of unspecified deep veins of unspecified lower extremity: Secondary | ICD-10-CM | POA: Diagnosis not present

## 2013-04-11 LAB — CBC
HCT: 35.9 % — ABNORMAL LOW (ref 39.0–52.0)
Hemoglobin: 12.1 g/dL — ABNORMAL LOW (ref 13.0–17.0)
MCH: 29.4 pg (ref 26.0–34.0)
MCHC: 33.7 g/dL (ref 30.0–36.0)
MCV: 87.1 fL (ref 78.0–100.0)
Platelets: 192 10*3/uL (ref 150–400)
RBC: 4.12 MIL/uL — ABNORMAL LOW (ref 4.22–5.81)
RDW: 15 % (ref 11.5–15.5)
WBC: 3 10*3/uL — ABNORMAL LOW (ref 4.0–10.5)

## 2013-04-11 LAB — BASIC METABOLIC PANEL
BUN: 11 mg/dL (ref 6–23)
CO2: 25 mEq/L (ref 19–32)
Calcium: 9.6 mg/dL (ref 8.4–10.5)
Chloride: 101 mEq/L (ref 96–112)
Creatinine, Ser: 0.72 mg/dL (ref 0.50–1.35)
GFR calc Af Amer: >90 mL/min (ref >90)
GFR calc non Af Amer: 80 mL/min — ABNORMAL LOW (ref >90)
Glucose, Bld: 113 mg/dL — ABNORMAL HIGH (ref 70–99)
Potassium: 4 mEq/L (ref 3.7–5.3)
Sodium: 139 mEq/L (ref 137–147)

## 2013-04-11 LAB — TROPONIN I
Troponin I: 2.9 ng/mL (ref <0.30)
Troponin I: 7.82 ng/mL (ref <0.30)
Troponin I: 5.25 ng/mL (ref <0.30)
Troponin I: 2.36 ng/mL (ref <0.30)

## 2013-04-11 LAB — HEPARIN LEVEL (UNFRACTIONATED)
Heparin Unfractionated: 0.49 IU/mL (ref 0.30–0.70)
Heparin Unfractionated: 0.87 IU/mL — ABNORMAL HIGH (ref 0.30–0.70)
Heparin Unfractionated: 0.54 IU/mL (ref 0.30–0.70)

## 2013-04-11 MED ORDER — WARFARIN SODIUM 5 MG PO TABS
5.0000 mg | ORAL_TABLET | ORAL | Status: AC
  Administered 2013-04-11: 5 mg via ORAL
  Filled 2013-04-11: qty 1

## 2013-04-11 NOTE — Progress Notes (Signed)
ANTICOAGULATION CONSULT NOTE -  Pharmacy Consult for Heparin  Indication: new PE and h/o DVT  Allergies  Allergen Reactions  . Lipitor [Atorvastatin] Other (See Comments)    unknown    Patient Measurements: Height: 5\' 7"  (170.2 cm) Weight: 150 lb 11.2 oz (68.357 kg) IBW/kg (Calculated) : 66.1  Vital Signs: Temp: 98.1 F (36.7 C) (12/31 2149) Temp src: Oral (12/31 2149) BP: 144/83 mmHg (12/31 2149) Pulse Rate: 73 (12/31 2149)  Labs:  Recent Labs  04/10/13 1411 04/10/13 1605  04/11/13 0140 04/11/13 0730 04/11/13 1350 04/11/13 2006 04/11/13 2305  HGB 12.0*  --   --  12.1*  --   --   --   --   HCT 35.4*  --   --  35.9*  --   --   --   --   PLT 183  --   --  192  --   --   --   --   APTT  --  29  --   --   --   --   --   --   LABPROT  --  18.7*  --   --   --   --   --   --   INR  --  1.61*  --   --   --   --   --   --   HEPARINUNFRC  --   --   < > 0.49  --  0.87*  --  0.54  CREATININE 0.73  --   --  0.72  --   --   --   --   TROPONINI  --   --   < > 2.90* 7.82* 5.25* 2.36*  --   < > = values in this interval not displayed.  Estimated Creatinine Clearance: 58.5 ml/min (by C-G formula based on Cr of 0.72).   Admit Complaint: 79 y.o. male with new PE for heparin.    Goal of Therapy:  INR 2-3 Heparin level 0.3-0.7 units/ml Monitor platelets by anticoagulation protocol: Yes   Plan:  Continue Heparin at current rate Follow-up am labs.   Geannie Risen, PharmD, BCPS

## 2013-04-11 NOTE — Progress Notes (Signed)
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 Lipitor [Atorvastatin] Other (See Comments)    unknown    Patient Measurements: Height: 5\' 7"  (170.2 cm) Weight: 160 lb (72.576 kg) IBW/kg (Calculated) : 66.1  Vital Signs: Temp: 97.8 F (36.6 C) (12/31 0453) BP: 154/80 mmHg (12/31 0453) Pulse Rate: 77 (12/31 0453)  Labs:  Recent Labs  04/10/13 1411 04/10/13 1605  04/10/13 1639 04/10/13 2245 04/11/13 0140 04/11/13 0730  HGB 12.0*  --   --   --   --  12.1*  --   HCT 35.4*  --   --   --   --  35.9*  --   PLT 183  --   --   --   --  192  --   APTT  --  29  --   --   --   --   --   LABPROT  --  18.7*  --   --   --   --   --   INR  --  1.61*  --   --   --   --   --   HEPARINUNFRC  --   --   --   --  0.25* 0.49  --   CREATININE 0.73  --   --   --   --  0.72  --   TROPONINI  --   --   < > 1.42* 4.49* 2.90* 7.82*  < > = values in this interval not displayed.  Estimated Creatinine Clearance: 58.5 ml/min (by C-G formula based on Cr of 0.72).   Admit Complaint: 79 y.o. male with chest pain for heparin. Chest CT suggest possibly acute PE but not large. He was diagnosed with extensive DVT earlier this month.  Troponin elevated.  MD considering IVC filter.  His heparin level came up to 0.49 from 0.25 after rate was increased from 1000 to 1100 units/hr.  He was given a total dose of coumadin 7.5 mg early this morning at ~ 0230 am.    PTA: Warfarin 5 mg daily, except 2.5 mg M,W, & F.  Note instructions changed 12/29, decreased from 2.5 mg alternating with 5 mg daily which was started 12/22.  No INR result in the EMR from this dose adjustment.  But presumably his INR was elevated.    Goal of Therapy:  INR 2-3 Heparin level 0.3-0.7 units/ml Monitor platelets by anticoagulation protocol: Yes   Plan:   1. Continue heparin drip at 1100 units/hr 2. 2nd confirmatory heparin level to be drawn  with next cardiac enzyme due at 1330 3. He got coumadin 7.5 mg ~0230 am so no more due today 4. Daily HL, CBC, INR. Herby Abraham, Pharm.D. [phone] 04/11/2013 10:38 AM

## 2013-04-11 NOTE — Progress Notes (Signed)
SUBJECTIVE:  He feels better today.  No acute SOB   PHYSICAL EXAM Filed Vitals:   04/10/13 1615 04/10/13 1630 04/10/13 2146 04/11/13 0453  BP: 160/119 154/95 143/77 154/80  Pulse: 81 81 83 77  Temp:   97.8 F (36.6 C) 97.8 F (36.6 C)  TempSrc:      Resp: 20 22 20 20   Height:   5\' 7"  (1.702 m)   Weight:      SpO2: 97% 98% 98% 99%   General:  No distress Neck:  JVD 10 cm at 45 degrees Lungs:  Clear Heart:  RRR Abdomen:  Positive bowel sounds, no rebound no guarding Extremities:  Mild edema on the feet  LABS: Lab Results  Component Value Date   TROPONINI 2.90* 04/11/2013   Results for orders placed during the hospital encounter of 04/10/13 (from the past 24 hour(s))  CBC WITH DIFFERENTIAL     Status: Abnormal   Collection Time    04/10/13  2:11 PM      Result Value Range   WBC 3.4 (*) 4.0 - 10.5 K/uL   RBC 4.03 (*) 4.22 - 5.81 MIL/uL   Hemoglobin 12.0 (*) 13.0 - 17.0 g/dL   HCT 45.4 (*) 09.8 - 11.9 %   MCV 87.8  78.0 - 100.0 fL   MCH 29.8  26.0 - 34.0 pg   MCHC 33.9  30.0 - 36.0 g/dL   RDW 14.7  82.9 - 56.2 %   Platelets 183  150 - 400 K/uL   Neutrophils Relative % 76  43 - 77 %   Neutro Abs 2.6  1.7 - 7.7 K/uL   Lymphocytes Relative 14  12 - 46 %   Lymphs Abs 0.5 (*) 0.7 - 4.0 K/uL   Monocytes Relative 9  3 - 12 %   Monocytes Absolute 0.3  0.1 - 1.0 K/uL   Eosinophils Relative 1  0 - 5 %   Eosinophils Absolute 0.0  0.0 - 0.7 K/uL   Basophils Relative 0  0 - 1 %   Basophils Absolute 0.0  0.0 - 0.1 K/uL  COMPREHENSIVE METABOLIC PANEL     Status: Abnormal   Collection Time    04/10/13  2:11 PM      Result Value Range   Sodium 140  137 - 147 mEq/L   Potassium 4.1  3.7 - 5.3 mEq/L   Chloride 104  96 - 112 mEq/L   CO2 22  19 - 32 mEq/L   Glucose, Bld 94  70 - 99 mg/dL   BUN 11  6 - 23 mg/dL   Creatinine, Ser 1.30  0.50 - 1.35 mg/dL   Calcium 9.3  8.4 - 86.5 mg/dL   Total Protein 6.7  6.0 - 8.3 g/dL   Albumin 3.6  3.5 - 5.2 g/dL   AST 19  0 - 37 U/L   ALT 11  0 - 53 U/L   Alkaline Phosphatase 65  39 - 117 U/L   Total Bilirubin 0.6  0.3 - 1.2 mg/dL   GFR calc non Af Amer 80 (*) >90 mL/min   GFR calc Af Amer >90  >90 mL/min  PRO B NATRIURETIC PEPTIDE     Status: Abnormal   Collection Time    04/10/13  2:11 PM      Result Value Range   Pro B Natriuretic peptide (BNP) 10535.0 (*) 0 - 450 pg/mL  POCT I-STAT TROPONIN I     Status: Abnormal   Collection Time  04/10/13  2:29 PM      Result Value Range   Troponin i, poc 0.17 (*) 0.00 - 0.08 ng/mL   Comment NOTIFIED PHYSICIAN     Comment 3           APTT     Status: None   Collection Time    04/10/13  4:05 PM      Result Value Range   aPTT 29  24 - 37 seconds  PROTIME-INR     Status: Abnormal   Collection Time    04/10/13  4:05 PM      Result Value Range   Prothrombin Time 18.7 (*) 11.6 - 15.2 seconds   INR 1.61 (*) 0.00 - 1.49  TROPONIN I     Status: Abnormal   Collection Time    04/10/13  4:39 PM      Result Value Range   Troponin I 1.42 (*) <0.30 ng/mL  TROPONIN I     Status: Abnormal   Collection Time    04/10/13 10:45 PM      Result Value Range   Troponin I 4.49 (*) <0.30 ng/mL  HEPARIN LEVEL (UNFRACTIONATED)     Status: Abnormal   Collection Time    04/10/13 10:45 PM      Result Value Range   Heparin Unfractionated 0.25 (*) 0.30 - 0.70 IU/mL  TROPONIN I     Status: Abnormal   Collection Time    04/11/13  1:40 AM      Result Value Range   Troponin I 2.90 (*) <0.30 ng/mL  BASIC METABOLIC PANEL     Status: Abnormal   Collection Time    04/11/13  1:40 AM      Result Value Range   Sodium 139  137 - 147 mEq/L   Potassium 4.0  3.7 - 5.3 mEq/L   Chloride 101  96 - 112 mEq/L   CO2 25  19 - 32 mEq/L   Glucose, Bld 113 (*) 70 - 99 mg/dL   BUN 11  6 - 23 mg/dL   Creatinine, Ser 2.95  0.50 - 1.35 mg/dL   Calcium 9.6  8.4 - 62.1 mg/dL   GFR calc non Af Amer 80 (*) >90 mL/min   GFR calc Af Amer >90  >90 mL/min  CBC     Status: Abnormal   Collection Time    04/11/13   1:40 AM      Result Value Range   WBC 3.0 (*) 4.0 - 10.5 K/uL   RBC 4.12 (*) 4.22 - 5.81 MIL/uL   Hemoglobin 12.1 (*) 13.0 - 17.0 g/dL   HCT 30.8 (*) 65.7 - 84.6 %   MCV 87.1  78.0 - 100.0 fL   MCH 29.4  26.0 - 34.0 pg   MCHC 33.7  30.0 - 36.0 g/dL   RDW 96.2  95.2 - 84.1 %   Platelets 192  150 - 400 K/uL  HEPARIN LEVEL (UNFRACTIONATED)     Status: None   Collection Time    04/11/13  1:40 AM      Result Value Range   Heparin Unfractionated 0.49  0.30 - 0.70 IU/mL    Intake/Output Summary (Last 24 hours) at 04/11/13 0830 Last data filed at 04/11/13 3244  Gross per 24 hour  Intake    300 ml  Output   3150 ml  Net  -2850 ml     ASSESSMENT AND PLAN:  ELEVATED TROPONIN:  Troponin went up.  CT suggests possibly acute  pulmonary emboli but not large.  Not clear whether this could be the etiology of this troponin elevation.  I would be more inclined to think that this was the problem rather than the development of new CAD as he had only mild luminal irregularities on cath in June.  I am not planning to do a cath this admission.  He had extensive DVT burden earlier this month.  I am going to check an echo to look for new wall motion abnormalities.  We might need to consider an IVC filter although without clear mortality benefit to this intervention I would be hesitant to do this..  Another likely etiology to the elevated troponin is decompensated CHF.   I think that this is the likely etiology.  CHEST PAIN:  As above.   ACUTE ON CHRONIC SYSTOLIC HF:  Good urine output yesterday.  Continue IV diuresis.    Scott [name] 04/11/2013 8:30 AM

## 2013-04-11 NOTE — Progress Notes (Signed)
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 Lipitor [Atorvastatin] Other (See Comments)    unknown    Patient Measurements: Height: 5\' 7"  (170.2 cm) Weight: 160 lb (72.576 kg) IBW/kg (Calculated) : 66.1  Vital Signs: Temp: 98 F (36.7 C) (12/31 1448) Temp src: Oral (12/31 1448) BP: 132/79 mmHg (12/31 1448) Pulse Rate: 77 (12/31 1448)  Labs:  Recent Labs  04/10/13 1411 04/10/13 1605  04/10/13 2245 04/11/13 0140 04/11/13 0730 04/11/13 1350  HGB 12.0*  --   --   --  12.1*  --   --   HCT 35.4*  --   --   --  35.9*  --   --   PLT 183  --   --   --  192  --   --   APTT  --  29  --   --   --   --   --   LABPROT  --  18.7*  --   --   --   --   --   INR  --  1.61*  --   --   --   --   --   HEPARINUNFRC  --   --   --  0.25* 0.49  --  0.87*  CREATININE 0.73  --   --   --  0.72  --   --   TROPONINI  --   --   < > 4.49* 2.90* 7.82*  --   < > = values in this interval not displayed.  Estimated Creatinine Clearance: 58.5 ml/min (by C-G formula based on Cr of 0.72).   Admit Complaint: 79 y.o. male with chest pain for heparin. Chest CT suggest possibly acute PE but not large. He was diagnosed with extensive DVT earlier this month.  Troponin elevated.  MD considering IVC filter.  His heparin level came up to 0.49 from 0.25 after rate was increased from 1000 to 1100 units/hr and repeat HL drawn at 1350 this afternoon is elevated at 0.87.  He was given a total dose of coumadin 7.5 mg early this morning at ~ 0230 am.    PTA: Warfarin 5 mg daily, except 2.5 mg M,W, & F.  Note instructions changed 12/29, decreased from 2.5 mg alternating with 5 mg daily which was started 12/22.  No INR result in the EMR from this dose adjustment.  But presumably his INR was elevated.    Goal of Therapy:  INR 2-3 Heparin level 0.3-0.7 units/ml Monitor platelets by anticoagulation protocol: Yes   Plan:    1. decrease heparin drip to 950 units/hr 2. Check  HL 8 hrs after rate decreased 3. He got coumadin 7.5 mg ~0230 am so no more due today 4. Daily HL, CBC, INR. 5.  ? Resume home aricept?  Herby Abraham, Pharm.D. [phone] 04/11/2013 2:59 PM

## 2013-04-12 DIAGNOSIS — Z86711 Personal history of pulmonary embolism: Secondary | ICD-10-CM

## 2013-04-12 DIAGNOSIS — I517 Cardiomegaly: Secondary | ICD-10-CM

## 2013-04-12 HISTORY — DX: Personal history of pulmonary embolism: Z86.711

## 2013-04-12 LAB — BASIC METABOLIC PANEL
BUN: 11 mg/dL (ref 6–23)
CO2: 24 mEq/L (ref 19–32)
Calcium: 9.2 mg/dL (ref 8.4–10.5)
Chloride: 97 mEq/L (ref 96–112)
Creatinine, Ser: 0.78 mg/dL (ref 0.50–1.35)
GFR calc Af Amer: >90 mL/min (ref >90)
GFR calc non Af Amer: 78 mL/min — ABNORMAL LOW (ref >90)
Glucose, Bld: 106 mg/dL — ABNORMAL HIGH (ref 70–99)
Potassium: 4.2 mEq/L (ref 3.7–5.3)
Sodium: 134 mEq/L — ABNORMAL LOW (ref 137–147)

## 2013-04-12 LAB — PROTIME-INR
INR: 2.48 — ABNORMAL HIGH (ref 0.00–1.49)
Prothrombin Time: 26 seconds — ABNORMAL HIGH (ref 11.6–15.2)

## 2013-04-12 LAB — CBC
HCT: 34.9 % — ABNORMAL LOW (ref 39.0–52.0)
Hemoglobin: 12 g/dL — ABNORMAL LOW (ref 13.0–17.0)
MCH: 29.3 pg (ref 26.0–34.0)
MCHC: 34.4 g/dL (ref 30.0–36.0)
MCV: 85.3 fL (ref 78.0–100.0)
Platelets: 176 10*3/uL (ref 150–400)
RBC: 4.09 MIL/uL — ABNORMAL LOW (ref 4.22–5.81)
RDW: 14.7 % (ref 11.5–15.5)
WBC: 4.1 10*3/uL (ref 4.0–10.5)

## 2013-04-12 LAB — HEPARIN LEVEL (UNFRACTIONATED): Heparin Unfractionated: 0.45 IU/mL (ref 0.30–0.70)

## 2013-04-12 LAB — TROPONIN I
Troponin I: 1.69 ng/mL (ref <0.30)
Troponin I: 2.87 ng/mL (ref <0.30)

## 2013-04-12 MED ORDER — METOPROLOL SUCCINATE ER 50 MG PO TB24
50.0000 mg | ORAL_TABLET | Freq: Once | ORAL | Status: AC
  Administered 2013-04-12: 50 mg via ORAL
  Filled 2013-04-12: qty 1

## 2013-04-12 MED ORDER — WARFARIN SODIUM 1 MG PO TABS
1.0000 mg | ORAL_TABLET | Freq: Once | ORAL | Status: AC
  Administered 2013-04-12: 1 mg via ORAL
  Filled 2013-04-12: qty 1

## 2013-04-12 MED ORDER — GUAIFENESIN ER 600 MG PO TB12
600.0000 mg | ORAL_TABLET | Freq: Two times a day (BID) | ORAL | Status: DC | PRN
  Administered 2013-04-12: 600 mg via ORAL
  Filled 2013-04-12: qty 1

## 2013-04-12 MED ORDER — DONEPEZIL HCL 5 MG PO TABS
5.0000 mg | ORAL_TABLET | Freq: Every day | ORAL | Status: DC
  Administered 2013-04-12 – 2013-04-18 (×7): 5 mg via ORAL
  Filled 2013-04-12 (×8): qty 1

## 2013-04-12 NOTE — Progress Notes (Signed)
Echocardiogram 2D Echocardiogram has been performed.  [place], [place] 04/12/2013, 2:17 PM

## 2013-04-12 NOTE — Progress Notes (Signed)
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 Lipitor [Atorvastatin] Other (See Comments)    unknown    Patient Measurements: Height: 5\' 7"  (170.2 cm) Weight: 150 lb 11.2 oz (68.357 kg) IBW/kg (Calculated) : 66.1  Vital Signs: Temp: 98.2 F (36.8 C) (01/01 0422) Temp src: Oral (01/01 0422) BP: 131/84 mmHg (01/01 0422) Pulse Rate: 70 (01/01 0422)  Labs:  Recent Labs  04/10/13 1411 04/10/13 1605  04/11/13 0140  04/11/13 1350 04/11/13 2006 04/11/13 2305 04/12/13 0123 04/12/13 0735  HGB 12.0*  --   --  12.1*  --   --   --   --  12.0*  --   HCT 35.4*  --   --  35.9*  --   --   --   --  34.9*  --   PLT 183  --   --  192  --   --   --   --  176  --   APTT  --  29  --   --   --   --   --   --   --   --   LABPROT  --  18.7*  --   --   --   --   --   --  26.0*  --   INR  --  1.61*  --   --   --   --   --   --  2.48*  --   HEPARINUNFRC  --   --   < > 0.49  --  0.87*  --  0.54 0.45  --   CREATININE 0.73  --   --  0.72  --   --   --   --  0.78  --   TROPONINI  --   --   < > 2.90*  < > 5.25* 2.36*  --  1.69* 2.87*  < > = values in this interval not displayed.  Estimated Creatinine Clearance: 58.5 ml/min (by C-G formula based on Cr of 0.78).   Admit Complaint: 80 y.o. male with chest pain for heparin. Chest CT suggest possibly acute PE but not large. He was diagnosed with extensive DVT earlier this month.  Troponin elevated.  MD considering IVC filter.  His heparin level is therapeutic this morning at 0.45 on heparin 950 units/hr.  His INR is therapeutic today at 2.48. There was a large 7.3 second jump in his protime today after getting coumadin 7.5 mg yesterday at 0230 am.  MD has stopped heparin drip.  PTA: Warfarin 5 mg daily, except 2.5 mg M,W, & F.  Note instructions changed 12/29, decreased from 2.5 mg alternating with 5 mg daily which was started 12/22.  No INR result in the EMR  from this dose adjustment.  But presumably his INR was elevated.    Goal of Therapy:  INR 2-3   Plan:   1. MD stopped heparin today 2. Coumadin 1 mg po x 1 dose today 3.  Daily INR. 4.  I resumed his home aricept of 5 mg qhs 5. I DCd q6hr troponins, he has had [place], Pharm.D. [phone] 04/12/2013 9:54 AM

## 2013-04-12 NOTE — Progress Notes (Signed)
Patient had 10 beat run vtach, asymptomatic. Paged on call MD at [facility], no new orders given at this time. Will continue to monitor patient.

## 2013-04-12 NOTE — Progress Notes (Signed)
Patients SBP=101 in left arm and 96 in right arm.  I held patients po norvasc and po metoprolol.  I notified Ignacia Bayley, NP.

## 2013-04-12 NOTE — Progress Notes (Signed)
Patient had 18 beats vtach.  Kerry Fort., PA with [facility] notified.  She stated to give the dose of po metoprolol that was held this AM.

## 2013-04-12 NOTE — Progress Notes (Signed)
SUBJECTIVE:  He feels better today.  No acute SOB.  He is weak.    PHYSICAL EXAM Filed Vitals:   04/11/13 0453 04/11/13 1448 04/11/13 2149 04/12/13 0422  BP: 154/80 132/79 144/83 131/84  Pulse: 77 77 73 70  Temp: 97.8 F (36.6 C) 98 F (36.7 C) 98.1 F (36.7 C) 98.2 F (36.8 C)  TempSrc:  Oral Oral Oral  Resp: 20 19 18 18   Height:      Weight:   150 lb 11.2 oz (68.357 kg)   SpO2: 99% 99% 100% 100%   General:  No distress Lungs:  Clear Heart:  RRR Abdomen:  Positive bowel sounds, no rebound no guarding Extremities:  Mild edema on the feet improved  LABS: Lab Results  Component Value Date   TROPONINI 1.69* 04/12/2013   Results for orders placed during the hospital encounter of 04/10/13 (from the past 24 hour(s))  TROPONIN I     Status: Abnormal   Collection Time    04/11/13  1:50 PM      Result Value Range   Troponin I 5.25 (*) <0.30 ng/mL  HEPARIN LEVEL (UNFRACTIONATED)     Status: Abnormal   Collection Time    04/11/13  1:50 PM      Result Value Range   Heparin Unfractionated 0.87 (*) 0.30 - 0.70 IU/mL  TROPONIN I     Status: Abnormal   Collection Time    04/11/13  8:06 PM      Result Value Range   Troponin I 2.36 (*) <0.30 ng/mL  HEPARIN LEVEL (UNFRACTIONATED)     Status: None   Collection Time    04/11/13 11:05 PM      Result Value Range   Heparin Unfractionated 0.54  0.30 - 0.70 IU/mL  TROPONIN I     Status: Abnormal   Collection Time    04/12/13  1:23 AM      Result Value Range   Troponin I 1.69 (*) <0.30 ng/mL  CBC     Status: Abnormal   Collection Time    04/12/13  1:23 AM      Result Value Range   WBC 4.1  4.0 - 10.5 K/uL   RBC 4.09 (*) 4.22 - 5.81 MIL/uL   Hemoglobin 12.0 (*) 13.0 - 17.0 g/dL   HCT 34.9 (*) 39.0 - 52.0 %   MCV 85.3  78.0 - 100.0 fL   MCH 29.3  26.0 - 34.0 pg   MCHC 34.4  30.0 - 36.0 g/dL   RDW 14.7  11.5 - 15.5 %   Platelets 176  150 - 400 K/uL  PROTIME-INR     Status: Abnormal   Collection Time    04/12/13  1:23 AM      Result Value Range   Prothrombin Time 26.0 (*) 11.6 - 15.2 seconds   INR 2.48 (*) 0.00 - 1.49  HEPARIN LEVEL (UNFRACTIONATED)     Status: None   Collection Time    04/12/13  1:23 AM      Result Value Range   Heparin Unfractionated 0.45  0.30 - 0.70 IU/mL  BASIC METABOLIC PANEL     Status: Abnormal   Collection Time    04/12/13  1:23 AM      Result Value Range   Sodium 134 (*) 137 - 147 mEq/L   Potassium 4.2  3.7 - 5.3 mEq/L   Chloride 97  96 - 112 mEq/L   CO2 24  19 - 32 mEq/L  Glucose, Bld 106 (*) 70 - 99 mg/dL   BUN 11  6 - 23 mg/dL   Creatinine, Ser 0.78  0.50 - 1.35 mg/dL   Calcium 9.2  8.4 - 10.5 mg/dL   GFR calc non Af Amer 78 (*) >90 mL/min   GFR calc Af Amer >90  >90 mL/min    Intake/Output Summary (Last 24 hours) at 04/12/13 0801 Last data filed at 04/11/13 2138  Gross per 24 hour  Intake    240 ml  Output   1525 ml  Net  -1285 ml     ASSESSMENT AND PLAN:  ELEVATED TROPONIN:  Troponin is fluctuating. CT suggests possibly acute pulmonary emboli but not large.  Not clear whether this could be the etiology of this troponin elevation.  I would suspect that this is related to the HF decompensation.  I do not suspect an ACS.  I spoke with his daughter in law and son at length on the phone yesterday.    CHEST PAIN:  As above.   ACUTE ON CHRONIC SYSTOLIC HF:  Good urine output yesterday.  Continue IV diuresis again today.   Creat is stable.    PE:  He had therapeutic INRs at home after discharge earlier this month.  However, he was low on his initial reading here.  Now therapeutic.  I will stop the heparin.      Am[facility] 04/12/2013 8:01 AM

## 2013-04-13 LAB — BASIC METABOLIC PANEL
BUN: 14 mg/dL (ref 6–23)
CO2: 28 mEq/L (ref 19–32)
Calcium: 9.8 mg/dL (ref 8.4–10.5)
Chloride: 102 mEq/L (ref 96–112)
Creatinine, Ser: 0.81 mg/dL (ref 0.50–1.35)
GFR calc Af Amer: 89 mL/min — ABNORMAL LOW (ref >90)
GFR calc non Af Amer: 77 mL/min — ABNORMAL LOW (ref >90)
Glucose, Bld: 96 mg/dL (ref 70–99)
Potassium: 3.7 mEq/L (ref 3.7–5.3)
Sodium: 142 mEq/L (ref 137–147)

## 2013-04-13 LAB — PROTIME-INR
INR: 2.33 — ABNORMAL HIGH (ref 0.00–1.49)
Prothrombin Time: 24.8 seconds — ABNORMAL HIGH (ref 11.6–15.2)

## 2013-04-13 LAB — TROPONIN I: Troponin I: 3.07 ng/mL (ref <0.30)

## 2013-04-13 LAB — MAGNESIUM: Magnesium: 1.9 mg/dL (ref 1.5–2.5)

## 2013-04-13 MED ORDER — MUPIROCIN 2 % EX OINT
1.0000 "application " | TOPICAL_OINTMENT | Freq: Two times a day (BID) | CUTANEOUS | Status: AC
  Administered 2013-04-13 – 2013-04-17 (×10): 1 via NASAL
  Filled 2013-04-13: qty 22

## 2013-04-13 MED ORDER — WARFARIN SODIUM 2.5 MG PO TABS
2.5000 mg | ORAL_TABLET | Freq: Once | ORAL | Status: AC
  Administered 2013-04-13: 2.5 mg via ORAL
  Filled 2013-04-13: qty 1

## 2013-04-13 MED ORDER — AMLODIPINE BESYLATE 2.5 MG PO TABS
2.5000 mg | ORAL_TABLET | Freq: Every day | ORAL | Status: DC
  Administered 2013-04-13 – 2013-04-15 (×3): 2.5 mg via ORAL
  Filled 2013-04-13 (×4): qty 1

## 2013-04-13 MED ORDER — CHLORHEXIDINE GLUCONATE CLOTH 2 % EX PADS
6.0000 | MEDICATED_PAD | Freq: Every day | CUTANEOUS | Status: AC
  Administered 2013-04-13 – 2013-04-17 (×5): 6 via TOPICAL

## 2013-04-13 NOTE — Evaluation (Signed)
Physical Therapy Evaluation Patient Details Name: Scott [name] MRN: [id number] DOB: [birth year] Today's Date: 04/13/2013 Time: 4782-9562 PT Time Calculation (min): 12 min  PT Assessment / Plan / Recommendation History of Present Illness  Patient is an 80 yo male admitted with chest pain and elevated troponins.  Patient with acute on chronic HF, ? pulmonary emboli.  Recent h/o DVT's.  Clinical Impression  Patient presents with problems listed below.  Will benefit from acute PT to maximize independence prior to discharge home.    PT Assessment  Patient needs continued PT services    Follow Up Recommendations  No PT follow up;Supervision - Intermittent    Does the patient have the potential to tolerate intense rehabilitation      Barriers to Discharge        Equipment Recommendations  None recommended by PT    Recommendations for Other Services     Frequency Min 3X/week    Precautions / Restrictions Precautions Precautions: None Restrictions Weight Bearing Restrictions: No   Pertinent Vitals/Pain O2 sat at 96% with ambulation on room air.  HR at 79 bpm.      Mobility  Bed Mobility Bed Mobility: Supine to Sit;Sitting - Scoot to Edge of Bed;Sit to Supine Supine to Sit: 5: Supervision;HOB flat Sitting - Scoot to Edge of Bed: 5: Supervision Sit to Supine: 5: Supervision;HOB flat Details for Bed Mobility Assistance: Supervision for safety only. Transfers Transfers: Sit to Stand;Stand to Sit Sit to Stand: 5: Supervision;With upper extremity assist;From bed Stand to Sit: 5: Supervision;To bed Details for Transfer Assistance: Verbal cues for hand placement.  Supervision for safety/balance. Ambulation/Gait Ambulation/Gait Assistance: 5: Supervision Ambulation Distance (Feet): 180 Feet Assistive device: Rolling walker Ambulation/Gait Assistance Details: Patient demonstrates safe use of RW.  Cues to stand upright - somewhat flexed posture. Gait Pattern: Step-through  pattern;Decreased stride length General Gait Details: O2 sats at 96% with ambulation.  HR at 79 with gait.    Exercises     PT Diagnosis: Abnormality of gait;Generalized weakness  PT Problem List: Decreased strength;Decreased balance;Decreased mobility;Cardiopulmonary status limiting activity PT Treatment Interventions: DME instruction;Gait training;Functional mobility training;Patient/family education     PT Goals(Current goals can be found in the care plan section) Acute Rehab PT Goals Patient Stated Goal: To go home soon PT Goal Formulation: With patient Time For Goal Achievement: 04/20/13 Potential to Achieve Goals: Good  Visit Information  Last PT Received On: 04/13/13 Assistance Needed: +1 History of Present Illness: Patient is an 80 yo male admitted with chest pain and elevated troponins.  Patient with acute on chronic HF, ? pulmonary emboli.  Recent h/o DVT's.       Prior [place] expects to be discharged to:: Private residence Living Arrangements: Spouse/significant other Available Help at Discharge: Family;Personal care attendant (Wife is paraplegic and has 24 hour care.  Other family prn) Type of Home: House Home Access: [place]: One level Home Equipment: [place] - 2 wheels;Cane - single point Prior Function Level of Independence: Independent with assistive device(s) (Uses cane) Communication Communication: No difficulties    Cognition  Cognition Arousal/Alertness: Awake/alert Behavior During Therapy: WFL for tasks assessed/performed Overall Cognitive Status: Within Functional Limits for tasks assessed    Extremity/Trunk Assessment Upper Extremity Assessment Upper Extremity Assessment: Overall WFL for tasks assessed Lower Extremity Assessment Lower Extremity Assessment: Generalized weakness   Balance    End of Session PT - End of Session Equipment Utilized During Treatment: Gait belt Activity Tolerance:  Patient  tolerated treatment well Patient left: in bed;with call bell/phone within reach Nurse Communication: Mobility status  GP     Despina Pole 04/13/2013, 5:40 PM Carita Pian. Sanjuana Kava, [place] Pager [phone]

## 2013-04-13 NOTE — Progress Notes (Signed)
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 Lipitor [Atorvastatin] Other (See Comments)    unknown    Patient Measurements: Height: 5\' 7"  (170.2 cm) Weight: 150 lb 11.2 oz (68.357 kg) IBW/kg (Calculated) : 66.1  Vital Signs: Temp: 98.8 F (37.1 C) (01/02 0506) Temp src: Oral (01/02 0506) BP: 119/67 mmHg (01/02 0938) Pulse Rate: 70 (01/02 0939)  Labs:  Recent Labs  04/10/13 1411 04/10/13 1605  04/11/13 0140  04/11/13 1350  04/11/13 2305 04/12/13 0123 04/12/13 0735 04/13/13 0714 04/13/13 0822  HGB 12.0*  --   --  12.1*  --   --   --   --  12.0*  --   --   --   HCT 35.4*  --   --  35.9*  --   --   --   --  34.9*  --   --   --   PLT 183  --   --  192  --   --   --   --  176  --   --   --   APTT  --  29  --   --   --   --   --   --   --   --   --   --   LABPROT  --  18.7*  --   --   --   --   --   --  26.0*  --  24.8*  --   INR  --  1.61*  --   --   --   --   --   --  2.48*  --  2.33*  --   HEPARINUNFRC  --   --   < > 0.49  --  0.87*  --  0.54 0.45  --   --   --   CREATININE 0.73  --   --  0.72  --   --   --   --  0.78  --   --  0.81  TROPONINI  --   --   < > 2.90*  < > 5.25*  < >  --  1.69* 2.87*  --  3.07*  < > = values in this interval not displayed.  Estimated Creatinine Clearance: 57.8 ml/min (by C-G formula based on Cr of 0.81).   Admit Complaint: 80 y.o. male with chest pain for heparin. Chest CT suggest possibly acute PE but not large. He was diagnosed with extensive DVT earlier this month.  Troponin elevated.  MD considering IVC filter.  Heparin stopped 1/1.  His INR is therapeutic today at 2.33.  No bleeding reported.   PTA: Warfarin 5 mg daily, except 2.5 mg M,W, & F.  Note instructions changed 12/29, decreased from 2.5 mg alternating with 5 mg daily which was started 12/22.  No INR result in the EMR from this dose adjustment.  But presumably his INR was  elevated.    Goal of Therapy:  INR 2-3   Plan:  1. Coumadin 2.5 mg po x 1 dose today 2. Daily INR Eudelia Bunch, Pharm.D. [phone] 04/13/2013 11:37 AM

## 2013-04-13 NOTE — Progress Notes (Signed)
Utilization review completed.  

## 2013-04-13 NOTE — Progress Notes (Signed)
    SUBJECTIVE:  He says that he is breathing OK.  His BP was low yesterday and Norvasc was held.  NSVT noted last yesterday.   PHYSICAL EXAM Filed Vitals:   04/12/13 1400 04/12/13 1612 04/12/13 2110 04/13/13 0506  BP: 115/61 129/62 118/75 155/78  Pulse: 62 64 68 75  Temp: 98.6 F (37 C)  98.3 F (36.8 C) 98.8 F (37.1 C)  TempSrc: Oral  Oral Oral  Resp: 16  18 18   Height:      Weight:      SpO2: 96%  100% 100%   General:  No distress Lungs:  Clear Heart:  RRR Abdomen:  Positive bowel sounds, no rebound no guarding Extremities:  No edema  LABS: Lab Results  Component Value Date   TROPONINI 2.87* 04/12/2013   Results for orders placed during the hospital encounter of 04/10/13 (from the past 24 hour(s))  TROPONIN I     Status: Abnormal   Collection Time    04/12/13  7:35 AM      Result Value Range   Troponin I 2.87 (*) <0.30 ng/mL    Intake/Output Summary (Last 24 hours) at 04/13/13 0716 Last data filed at 04/13/13 0506  Gross per 24 hour  Intake    483 ml  Output   1175 ml  Net   -692 ml     ASSESSMENT AND PLAN:  ELEVATED TROPONIN:  Troponin is fluctuating.  I will repeat a troponin today.  CT suggests possibly acute pulmonary emboli but not large.  Not clear whether this could be the etiology of this troponin elevation.  I would suspect that this is related to the HF decompensation.  I do not suspect an ACS.  I spoke with his daughter in law and son at length on the phone during this admission.    NSVT:  Medical management.  I would not plan an ICD given his advanced age. I will discuss this with his family.  I will continue current beta blocker.  However, with low BP yesterday I reduced his Norvasc  CHEST PAIN:  As above.   ACUTE ON CHRONIC SYSTOLIC HF:  I think that he is euvolemic.  I had IV diuresis.  He is not on home diuretic and I will hold diuresis today.  He will likely need a low dose of PO at discharge.   PE:  He had therapeutic INRs at home after  discharge earlier this month.  However, he was low on his initial reading here.  Now therapeutic. Heparin stopped yesterday.       DISPOSITION:  Possibly home in the AM.  I would like to see how he does off of oxygen and get PT.   Courtenay Creger 04/13/2013 7:16 AM

## 2013-04-14 ENCOUNTER — Encounter (HOSPITAL_COMMUNITY): Payer: Self-pay | Admitting: Physician Assistant

## 2013-04-14 LAB — BASIC METABOLIC PANEL
BUN: 13 mg/dL (ref 6–23)
CO2: 24 mEq/L (ref 19–32)
Calcium: 9.6 mg/dL (ref 8.4–10.5)
Chloride: 103 mEq/L (ref 96–112)
Creatinine, Ser: 0.75 mg/dL (ref 0.50–1.35)
GFR calc Af Amer: >90 mL/min (ref >90)
GFR calc non Af Amer: 79 mL/min — ABNORMAL LOW (ref >90)
Glucose, Bld: 77 mg/dL (ref 70–99)
Potassium: 4 mEq/L (ref 3.7–5.3)
Sodium: 138 mEq/L (ref 137–147)

## 2013-04-14 LAB — PROTIME-INR
INR: 1.76 — ABNORMAL HIGH (ref 0.00–1.49)
Prothrombin Time: 20 seconds — ABNORMAL HIGH (ref 11.6–15.2)

## 2013-04-14 MED ORDER — HEPARIN (PORCINE) IN NACL 100-0.45 UNIT/ML-% IJ SOLN
1200.0000 [IU]/h | INTRAMUSCULAR | Status: AC
  Administered 2013-04-14: 950 [IU]/h via INTRAVENOUS
  Administered 2013-04-17: 1100 [IU]/h via INTRAVENOUS
  Administered 2013-04-17: 1200 [IU]/h via INTRAVENOUS
  Filled 2013-04-14 (×5): qty 250

## 2013-04-14 MED ORDER — WARFARIN SODIUM 5 MG PO TABS
5.0000 mg | ORAL_TABLET | Freq: Once | ORAL | Status: AC
  Administered 2013-04-14: 5 mg via ORAL
  Filled 2013-04-14: qty 1

## 2013-04-14 MED ORDER — HEPARIN BOLUS VIA INFUSION
2500.0000 [IU] | Freq: Once | INTRAVENOUS | Status: AC
  Administered 2013-04-14: 2500 [IU] via INTRAVENOUS
  Filled 2013-04-14: qty 2500

## 2013-04-14 NOTE — Progress Notes (Signed)
    SUBJECTIVE:  No events overnight.     PHYSICAL EXAM Filed Vitals:   04/13/13 1416 04/13/13 1656 04/13/13 2051 04/14/13 0416  BP: 143/88  150/76 135/68  Pulse: 81 79 64 72  Temp: 98.9 F (37.2 C)  98.8 F (37.1 C) 98.2 F (36.8 C)  TempSrc: Oral  Oral Oral  Resp: 16  18 18   Height:      Weight:    148 lb 9.6 oz (67.405 kg)  SpO2: 97% 96% 97% 96%   General:  No distress Lungs:  Clear Heart:  RRR Abdomen:  Positive bowel sounds, no rebound no guarding Extremities:  No edema  LABS: Lab Results  Component Value Date   TROPONINI 3.07* 04/13/2013   Results for orders placed during the hospital encounter of 04/10/13 (from the past 24 hour(s))  PROTIME-INR     Status: Abnormal   Collection Time    04/14/13  5:19 AM      Result Value Range   Prothrombin Time 20.0 (*) 11.6 - 15.2 seconds   INR 1.76 (*) 0.00 - 3.90  BASIC METABOLIC PANEL     Status: Abnormal   Collection Time    04/14/13  5:19 AM      Result Value Range   Sodium 138  137 - 147 mEq/L   Potassium 4.0  3.7 - 5.3 mEq/L   Chloride 103  96 - 112 mEq/L   CO2 24  19 - 32 mEq/L   Glucose, Bld 77  70 - 99 mg/dL   BUN 13  6 - 23 mg/dL   Creatinine, Ser 0.75  0.50 - 1.35 mg/dL   Calcium 9.6  8.4 - 10.5 mg/dL   GFR calc non Af Amer 79 (*) >90 mL/min   GFR calc Af Amer >90  >90 mL/min    Intake/Output Summary (Last 24 hours) at 04/14/13 0946 Last data filed at 04/14/13 0845  Gross per 24 hour  Intake    360 ml  Output   1000 ml  Net   -640 ml     ASSESSMENT AND PLAN:  ELEVATED TROPONIN:  Troponin is mildly elevated still.   CT suggests possibly acute pulmonary emboli but not large.  Not clear whether this could be the etiology of this troponin elevation.  I would suspect that this is related to the HF decompensation.  I do not suspect an ACS.  I spoke with his daughter in law and son at length on the phone again today.    NSVT:  Telemetry reviewed and no events recorded overnight.  Medical management.  I  would not plan an ICD given his advanced age. I talked with his family and they agree.   CHEST PAIN:  As above.   ACUTE ON CHRONIC SYSTOLIC HF:   Send home with Lasix 20 mg daily  PE:  He had therapeutic INRs at home after discharge earlier this month.  However, he was low on his initial reading here.  Now therapeutic. Heparin stopped yesterday.       DISPOSITION:   Home today.  Add him to my schedule in [place] on Wed.  [facility] 04/14/2013 9:46 AM

## 2013-04-14 NOTE — Discharge Summary (Signed)
Discharge Summary   Patient ID: Scott [name] MRN: [id number], DOB/AGE: December 08, 1921 80 y.o. Admit date: 04/10/2013 D/C date:     04/19/2013  Primary Care Provider: Redge Gainer, MD Primary Cardiologist: [place]  Primary Discharge Diagnoses:  1. Elevated troponin with chest pain - possibly related to PE/CHF 2. Acute on chronic systolic CHF/NICM  - EF [id number] in 06/2012, down to 20-25% 03/2013 felt possibly due to ongoing EtOh - cath 06/2012 with patent coronaries 3. Pulmonary embolism by CT angio this admission 4. NSVT, for continued medical therapy 5. Bilateral DVT 03/2013  Secondary Discharge Diagnoses:  1. LBBB 2. HTN 3. HLD 4. PVCs 5. Glaucoma 6. PVD - Right ABI .75, Left .78 (2006)  7. Popliteal artery aneurysm, bilateral - DECUMENTED CHRONIC PARTIAL OCCLUSION 8. Frequency of urination 9. Nocturia 10. Bladder cancer 11. DDD 12. Arthritis 13. EtOH abuse 14. Postural syncope related to diuretics  Hospital Course:[name] is a 80 y.o.male with NICM (06/2012 cath with patent cors - EF 40% then, more recently 20-25%), HTN, chronic LBBB, postural syncope related to diuretics, EtOH abuse, and recent DVT who was admitted with CP and elevated troponin. He was seen by EP in March of this year after being admitted with syncope with episodes of asymptomatic NSVT on telemetry. Echo at that time showed LVEF 40% with multiple WMAs, subsequent cath showed patent coronaries. Syncope was thought to be orthostatic and likely related to several medications he was on (HCTZ, tamsulosin, norvasc, metoprolol). He was recently admitted 03/2013 and found to have acute bilateral DVT, thus was placed on Coumadin. His further depressed EF was felt related to ongoing alcohol use.  He presented back to the hospital 04/10/2013 with midsternal chest discomfort with radiation to the shoulders and shortness of breath. He's had similar discomfort in the past. He reported significant bilateral edema but was not sure  how much worse it was since last hospitalization. He also reported recent cough without fever. CXR showed mild congestion/pulm edema without segmental infiltrate. EKG showed NSR with repol abnormality without change from 12/14. Initial troponin was positive at 1.42, pBNP was 10k. Given negative cath in March 2014, his troponin was not felt to be reflective of ACS. He was admitted and treated for acute on chronic systolic CHF with IV Lasix. INR was slightly subtherapeutic on admission thus he was treated with IV heparin for cross coverage. CT angio was obtained, showing: 1. Two small nonocclusive filling defects within the upper lobe pulmonary arteries. Differential includes acute pulmonary embolism versus chronic pulmonary embolism. 2. Central peribronchial vascular thickening and bilateral pleural effusions may represent pulmonary edema and interstitial edema.Cannot exclude a malignant process. The etiology of his troponin was felt possibly due to PE and CHF (peaked at 7.82 but with fluctuating trend). He clinically improved with treatment of CHF. He was seen by inpatient PT and did not require initiation of home PT. The patient also had NSVT this admission. Dr. Percival Spanish recommended continued medical therapy and does not plan ICD given his advanced age. Dr. Percival Spanish had a discussion with the patient's family about his overall condition. They agreed with this decision.   Unfortunately the patient experienced a drop in his INR despite titration of Coumadin. He declined to go home with Lovenox and instead elected to remain in the hospital on IV heparin while bridged. INR went from 1.76 to 2.69 today. Per discussion with pharmacy, continue Coumadin 5mg  daily and recheck INR tomorrow by Home Health to make sure INR remaining fairly stable because  of the increase today. Results will be called to his PCP's office who manages his INR. Family requested brand name rx. Today the patient feels better. He will remain on  low dose diuretic at discharge for volume maintenance. He has history of postural dizziness so we have not pressed antihypertensives further. Dr. Stanford Breed has seen and examined the patient today and feels he is stable for discharge.  I have spoken with our schedulers to see about scheduling the patient in [place] for a TOC visit - they will be sending a message to approve an add-on appointment within 1-2 weeks. The patient was also instructed to follow up with his primary care doctor given findings this adm (CT angio cannot rule out malignancy).    Discharge Vitals: Blood pressure 164/74, pulse 74, temperature 97.9 F (36.6 C), temperature source Oral, resp. rate 18, height 5\' 7"  (1.702 m), weight 153 lb 6.4 oz (69.582 kg), SpO2 100.00%.  Labs: Lab Results  Component Value Date   WBC 3.5* 04/19/2013   HGB 11.5* 04/19/2013   HCT 34.6* 04/19/2013   MCV 86.7 04/19/2013   PLT 194 04/19/2013     Recent Labs Lab 04/18/13 0545  NA 138  K 4.1  CL 105  CO2 19  BUN 14  CREATININE 0.72  CALCIUM 9.7  GLUCOSE 93    Lab Results  Component Value Date   CHOL 132 11/02/2012   HDL 34* 02/12/2013   LDLCALC 23 02/12/2013   TRIG 300* 02/12/2013     Diagnostic Studies/Procedures   Ct Angio Chest W/cm &/or Wo Cm 04/11/2013   CLINICAL DATA:  Chest pain bladder cancer  EXAM: CT ANGIOGRAPHY CHEST WITH CONTRAST  TECHNIQUE: Multidetector CT imaging of the chest was performed using the standard protocol during bolus administration of intravenous contrast. Multiplanar CT image reconstructions including MIPs were obtained to evaluate the vascular anatomy.  CONTRAST:  20mL OMNIPAQUE IOHEXOL 350 MG/ML SOLN  COMPARISON:  CT thorax 12/26/2010  FINDINGS: There small filling defect within the proximal segmental branch of the posterior left upper lobe of pulmonary artery seen best some coronal image 96, series 7. This filling defects is nonocclusive. There is a thin web-like filling defect within the right middle lobe  pulmonary artery (image 108, series 6) which is also nonocclusive.  No acute findings of the aorta great vessels. Coronary artery calcifications are present.  There is central peribronchovascular thickening extends the lower lobes. Bilateral moderate effusions.  Limited view of the upper abdomen is unremarkable. No axillary supraclavicular lymphadenopathy. Six no mediastinal hilar lymphadenopathy. No aggressive osseous lesion.  Review of the MIP images confirms the above findings.  IMPRESSION: 1. Two small nonocclusive filling defects within the upper lobe pulmonary arteries. Differential includes acute pulmonary embolism versus chronic pulmonary embolism. 2. Central peribronchial vascular thickening and bilateral pleural effusions may represent pulmonary edema and interstitial edema. Cannot exclude a malignant process.   Electronically Signed   By: Suzy Bouchard M.D.   On: 04/11/2013 00:57   Dg Chest Port 1 View 04/10/2013   CLINICAL DATA:  Shortness of Breath  EXAM: PORTABLE CHEST - 1 VIEW  COMPARISON:  03/14/2013  FINDINGS: Cardiomegaly again noted. Again noted mild congestion/pulmonary edema. No focal infiltrate.  IMPRESSION: Again noted mild congestion/pulmonary edema. No segmental infiltrate.   Electronically Signed   By: Lahoma Crocker M.D.   On: 04/10/2013 13:16    Discharge Medications     Medication List    STOP taking these medications  amLODipine 5 MG tablet  Commonly known as:  NORVASC     potassium chloride 10 MEQ tablet  Commonly known as:  K-DUR      TAKE these medications       bimatoprost 0.03 % ophthalmic solution  Commonly known as:  LUMIGAN  Place 1 drop into both eyes at bedtime.     donepezil 10 MG disintegrating tablet  Commonly known as:  ARICEPT ODT  Take 5 mg by mouth at bedtime.     furosemide 20 MG tablet  Commonly known as:  LASIX  Take 1 tablet (20 mg total) by mouth daily.     HYDROcodone-acetaminophen 10-325 MG per tablet  Commonly known as:   NORCO  Take 1 tablet by mouth every 6 (six) hours as needed for moderate pain.     lisinopril 2.5 MG tablet  Commonly known as:  PRINIVIL,ZESTRIL  Take 1 tablet (2.5 mg total) by mouth daily.     megestrol 20 MG tablet  Commonly known as:  MEGACE  Take 20 mg by mouth daily.     metoprolol succinate 100 MG 24 hr tablet  Commonly known as:  TOPROL-XL  Take 50 mg by mouth daily. Take with or immediately following a meal.     nitroGLYCERIN 0.4 MG SL tablet  Commonly known as:  NITROSTAT  Place 1 tablet (0.4 mg total) under the tongue every 5 (five) minutes as needed for chest pain (up to 3 doses).     simvastatin 10 MG tablet  Commonly known as:  ZOCOR  Take 10 mg by mouth daily.     tamsulosin 0.4 MG Caps capsule  Commonly known as:  FLOMAX  Take 0.4 mg by mouth daily.     warfarin 5 MG tablet  Commonly known as:  COUMADIN  Take 1 tablet (5 mg total) by mouth daily at 6 PM. Or as directed. Brand name please. Doctor's comments: Take 1 tablet (5mg ) daily for now. Your primary care doctor's office will let you know if your dose needs adjusting.         Disposition   The patient will be discharged in stable condition to home. Discharge Orders   Future Appointments Provider Department Dept Phone   05/17/2013 9:30 AM Chipper Herb, MD [place] [phone]   05/28/2013 3:00 PM Chipper Herb, MD [place] [phone]   Future Orders Complete By Expires   Diet - low sodium heart healthy  As directed    Increase activity slowly  As directed    Comments:     No driving unless cleared by your cardiologist. No lifting over 10 lbs for 2 weeks. No sexual activity for 2 weeks.     Follow-up Information   Follow up with Minus Breeding, MD. (Our office will call you for a follow-up appointment. We are working on schedule this in [place]. Please call the office if you have not heard from [place] within 3 days.)    Contact information:    [phone]      Follow up with [place]. (Please have home health nurse draw INR tomorrow (04/20/13) and call results into [facility] to receive instructions on Coumadin dose.)       Schedule an appointment as soon as possible for a visit with Redge Gainer, MD.   Specialty:  Family Medicine   Contact information:   [address] [place] [address] [phone]  Duration of Discharge Encounter: Greater than 30 minutes including physician and PA time.  Signed, Melina Copa PA-C 04/19/2013, 11:37 AM

## 2013-04-14 NOTE — Progress Notes (Signed)
ANTICOAGULATION CONSULT NOTE - Follow Up Consult  Pharmacy Consult for Heparin and Coumadin Indication: hx PE and bilateral DVT  Allergies  Allergen Reactions  . Lipitor [Atorvastatin] Other (See Comments)    unknown    Patient Measurements: Height: 5\' 7"  (170.2 cm) Weight: 148 lb 9.6 oz (67.405 kg) IBW/kg (Calculated) : 66.1 Heparin Dosing Weight: 67.4 kg  Vital Signs: Temp: 98.3 F (36.8 C) (01/03 1500) Temp src: Oral (01/03 1500) BP: 128/72 mmHg (01/03 1500) Pulse Rate: 59 (01/03 1500)  Labs:  Recent Labs  04/11/13 2305 04/12/13 0123 04/12/13 0735 04/13/13 0714 04/13/13 0822 04/14/13 0519  HGB  --  12.0*  --   --   --   --   HCT  --  34.9*  --   --   --   --   PLT  --  176  --   --   --   --   LABPROT  --  26.0*  --  24.8*  --  20.0*  INR  --  2.48*  --  2.33*  --  1.76*  HEPARINUNFRC 0.54 0.45  --   --   --   --   CREATININE  --  0.78  --   --  0.81 0.75  TROPONINI  --  1.69* 2.87*  --  3.07*  --     Estimated Creatinine Clearance: 58.5 ml/min (by C-G formula based on Cr of 0.75).  Assessment:   INR has fallen below target range.  Heparin to resume. Heparin had been stopped on 1/1 when INR up to 2.48.  Only 1 mg Coumadin given on 1/1, then 2.5 mg on 1/2. Previous home Coumadin dose: 2.5 mg MWF, 5 mg TTSS, then changed to 2.5 mg alternating with 5 mg on 12/29.  Goal of Therapy:  INR 2-3 Heparin level 0.3-0.7 units/ml Monitor platelets by anticoagulation protocol: Yes   Plan:    Heparin 2500 units IV bolus, then resume drip at prior rate of 950 units/hr.  Heparin level ~ 6hrs after resuming.   Coumadin 5 mg today.   Daily heparin level, CBC and PT/INR.  Arty Baumgartner, [place] Pager: [phone] 04/14/2013,4:00 PM

## 2013-04-14 NOTE — Progress Notes (Signed)
INR 1.76. D/w Dr. Percival Spanish. Given PE, needs to stay until therapeutic. Will re-initate heparin per pharmacy. I also clarified that patient does not necessarily need ASA given h/o patent cors, ongoing anticoagulation, and EtOH use (risk of bleeding with concomitant warfarin). Josedejesus Marcum PA-C

## 2013-04-15 DIAGNOSIS — I82409 Acute embolism and thrombosis of unspecified deep veins of unspecified lower extremity: Secondary | ICD-10-CM

## 2013-04-15 LAB — CBC
HCT: 37.1 % — ABNORMAL LOW (ref 39.0–52.0)
Hemoglobin: 12.8 g/dL — ABNORMAL LOW (ref 13.0–17.0)
MCH: 29.4 pg (ref 26.0–34.0)
MCHC: 34.5 g/dL (ref 30.0–36.0)
MCV: 85.1 fL (ref 78.0–100.0)
Platelets: 210 10*3/uL (ref 150–400)
RBC: 4.36 MIL/uL (ref 4.22–5.81)
RDW: 14.9 % (ref 11.5–15.5)
WBC: 3.6 10*3/uL — ABNORMAL LOW (ref 4.0–10.5)

## 2013-04-15 LAB — HEPARIN LEVEL (UNFRACTIONATED)
Heparin Unfractionated: 0.36 IU/mL (ref 0.30–0.70)
Heparin Unfractionated: 0.41 IU/mL (ref 0.30–0.70)

## 2013-04-15 LAB — PROTIME-INR
INR: 1.55 — ABNORMAL HIGH (ref 0.00–1.49)
Prothrombin Time: 18.2 seconds — ABNORMAL HIGH (ref 11.6–15.2)

## 2013-04-15 MED ORDER — WARFARIN SODIUM 7.5 MG PO TABS
7.5000 mg | ORAL_TABLET | Freq: Once | ORAL | Status: AC
  Administered 2013-04-15: 7.5 mg via ORAL
  Filled 2013-04-15: qty 1

## 2013-04-15 NOTE — Progress Notes (Signed)
ANTICOAGULATION CONSULT NOTE - Follow Up Consult  Pharmacy Consult for heparin Indication: h/o PE/DVT  Labs:  Recent Labs  04/12/13 0735 04/13/13 0714 04/13/13 0822 04/14/13 0519 04/15/13 0140  HGB  --   --   --   --  12.8*  HCT  --   --   --   --  37.1*  PLT  --   --   --   --  210  LABPROT  --  24.8*  --  20.0* 18.2*  INR  --  2.33*  --  1.76* 1.55*  HEPARINUNFRC  --   --   --   --  0.36  CREATININE  --   --  0.81 0.75  --   TROPONINI 2.87*  --  3.07*  --   --     Assessment/Plan:  80yo male therapeutic on heparin after resumed for low INR.  Will continue gtt at current rate and confirm with additional level.  Wynona Neat, PharmD, BCPS  04/15/2013,3:04 AM

## 2013-04-15 NOTE — Progress Notes (Signed)
    SUBJECTIVE:  No events overnight.     PHYSICAL EXAM Filed Vitals:   04/14/13 1500 04/14/13 2100 04/15/13 0500 04/15/13 0923  BP: 128/72 135/81 156/96 134/57  Pulse: 59 74 70 79  Temp: 98.3 F (36.8 C) 98.3 F (36.8 C) 98 F (36.7 C)   TempSrc: Oral     Resp: 16 18 18    Height:      Weight:      SpO2: 98% 99% 99%    General:  No distress Lungs:  Clear Heart:  RRR Abdomen:  Positive bowel sounds, no rebound no guarding Extremities:  No edema  LABS: Lab Results  Component Value Date   TROPONINI 3.07* 04/13/2013   Results for orders placed during the hospital encounter of 04/10/13 (from the past 24 hour(s))  HEPARIN LEVEL (UNFRACTIONATED)     Status: None   Collection Time    04/15/13  1:40 AM      Result Value Range   Heparin Unfractionated 0.36  0.30 - 0.70 IU/mL  CBC     Status: Abnormal   Collection Time    04/15/13  1:40 AM      Result Value Range   WBC 3.6 (*) 4.0 - 10.5 K/uL   RBC 4.36  4.22 - 5.81 MIL/uL   Hemoglobin 12.8 (*) 13.0 - 17.0 g/dL   HCT 37.1 (*) 39.0 - 52.0 %   MCV 85.1  78.0 - 100.0 fL   MCH 29.4  26.0 - 34.0 pg   MCHC 34.5  30.0 - 36.0 g/dL   RDW 14.9  11.5 - 15.5 %   Platelets 210  150 - 400 K/uL  PROTIME-INR     Status: Abnormal   Collection Time    04/15/13  1:40 AM      Result Value Range   Prothrombin Time 18.2 (*) 11.6 - 15.2 seconds   INR 1.55 (*) 0.00 - 1.49    Intake/Output Summary (Last 24 hours) at 04/15/13 1018 Last data filed at 04/15/13 0500  Gross per 24 hour  Intake    600 ml  Output   1350 ml  Net   -750 ml     ASSESSMENT AND PLAN:  ELEVATED TROPONIN:  Troponin is mildly elevated still.   CT suggests possibly acute pulmonary emboli but not large.  Not clear whether this could be the etiology of this troponin elevation.  I would suspect that this is related to the HF decompensation.  I do not suspect an ACS.  I spoke with his daughter in law and son at length on the phone again yesterday.    NSVT:  Medical  management.  I would not plan an ICD given his advanced age. I talked with his family and they agree.   CHEST PAIN:  As above.   ACUTE ON CHRONIC SYSTOLIC HF:   Send home with Lasix 20 mg daily when he goes home  PE:  He had therapeutic INRs at home after discharge earlier this month.  However, he was low on his initial reading here.  Back on heparin because INR fell     DISPOSITION:   Home when INR is greater than 2.     [facility] 04/15/2013 10:18 AM

## 2013-04-15 NOTE — Progress Notes (Signed)
ANTICOAGULATION CONSULT NOTE - Follow Up Consult  Pharmacy Consult for Heparin and Coumadin Indication: hx PE and bilateral DVT  Allergies  Allergen Reactions  . Lipitor [Atorvastatin] Other (See Comments)    unknown    Patient Measurements: Height: 5\' 7"  (170.2 cm) Weight: 148 lb 9.6 oz (67.405 kg) IBW/kg (Calculated) : 66.1 Heparin Dosing Weight: 67.4 kg  Vital Signs: Temp: 98.5 F (36.9 C) (01/04 1316) Temp src: Oral (01/04 1316) BP: 119/72 mmHg (01/04 1316) Pulse Rate: 55 (01/04 1316)  Labs:  Recent Labs  04/13/13 0714 04/13/13 0822 04/14/13 0519 04/15/13 0140 04/15/13 0930  HGB  --   --   --  12.8*  --   HCT  --   --   --  37.1*  --   PLT  --   --   --  210  --   LABPROT 24.8*  --  20.0* 18.2*  --   INR 2.33*  --  1.76* 1.55*  --   HEPARINUNFRC  --   --   --  0.36 0.41  CREATININE  --  0.81 0.75  --   --   TROPONINI  --  3.07*  --   --   --     Estimated Creatinine Clearance: 58.5 ml/min (by C-G formula based on Cr of 0.75).  Assessment:   INR fell below target range on 1/3 and Heparin drip resumed. Heparin level have been therapeutic x 2 on 950 units/hr.  Heparin had been stopped on 1/1 when INR up to 2.48.  Only 1 mg Coumadin given on 1/1, then 2.5 mg on 1/2. Previous home Coumadin dose: 2.5 mg MWF, 5 mg TTSS, then changed to 2.5 mg alternating with 5 mg on 12/29. Coumadin dose increased to 5 mg on 1/3, but INR has dropped again.  Goal of Therapy:  INR 2-3 Heparin level 0.3-0.7 units/ml Monitor platelets by anticoagulation protocol: Yes   Plan:    Continue Heparin drip at 950 units/hr.   Increase Coumadin to 7.5 mg x 1 today.   Daily heparin level, CBC and PT/INR.    Stop Megace 20 mg daily?   Seems to have been added 10/2012 for appetite stimulation, but usual dose for that indication is 400-800 mg suspension daily.  Should probably be stopped with thromboembolic disease.     Arty Baumgartner, [place] Pager: [phone] 04/15/2013,2:24 PM

## 2013-04-16 LAB — PROTIME-INR
INR: 1.7 — ABNORMAL HIGH (ref 0.00–1.49)
Prothrombin Time: 19.5 seconds — ABNORMAL HIGH (ref 11.6–15.2)

## 2013-04-16 LAB — CBC
HCT: 33.4 % — ABNORMAL LOW (ref 39.0–52.0)
Hemoglobin: 11.3 g/dL — ABNORMAL LOW (ref 13.0–17.0)
MCH: 29 pg (ref 26.0–34.0)
MCHC: 33.8 g/dL (ref 30.0–36.0)
MCV: 85.6 fL (ref 78.0–100.0)
Platelets: 183 10*3/uL (ref 150–400)
RBC: 3.9 MIL/uL — ABNORMAL LOW (ref 4.22–5.81)
RDW: 14.8 % (ref 11.5–15.5)
WBC: 3.4 10*3/uL — ABNORMAL LOW (ref 4.0–10.5)

## 2013-04-16 LAB — HEPARIN LEVEL (UNFRACTIONATED): Heparin Unfractionated: 0.34 IU/mL (ref 0.30–0.70)

## 2013-04-16 MED ORDER — LISINOPRIL 2.5 MG PO TABS
2.5000 mg | ORAL_TABLET | Freq: Every day | ORAL | Status: DC
  Administered 2013-04-16 – 2013-04-19 (×4): 2.5 mg via ORAL
  Filled 2013-04-16 (×4): qty 1

## 2013-04-16 MED ORDER — WARFARIN SODIUM 7.5 MG PO TABS
7.5000 mg | ORAL_TABLET | Freq: Once | ORAL | Status: AC
  Administered 2013-04-16: 7.5 mg via ORAL
  Filled 2013-04-16 (×2): qty 1

## 2013-04-16 NOTE — Progress Notes (Signed)
The Chaplain stopped by the room to visit with the patient but the patient was resting.  Chaplain Kwan Mell Mellott 

## 2013-04-16 NOTE — Progress Notes (Addendum)
ANTICOAGULATION CONSULT NOTE - Follow Up Consult  Pharmacy Consult for Heparin and Coumadin Indication: hx PE and bilateral DVT  Allergies  Allergen Reactions  . Lipitor [Atorvastatin] Other (See Comments)    unknown    Patient Measurements: Height: 5\' 7"  (170.2 cm) Weight: 148 lb 9.6 oz (67.405 kg) IBW/kg (Calculated) : 66.1 Heparin Dosing Weight: 67.4 kg  Vital Signs: Temp: 98.8 F (37.1 C) (01/05 0500) BP: 126/80 mmHg (01/05 0500) Pulse Rate: 65 (01/05 0500)  Labs:  Recent Labs  04/14/13 0519 04/15/13 0140 04/15/13 0930 04/16/13 0515  HGB  --  12.8*  --  11.3*  HCT  --  37.1*  --  33.4*  PLT  --  210  --  183  LABPROT 20.0* 18.2*  --  19.5*  INR 1.76* 1.55*  --  1.70*  HEPARINUNFRC  --  0.36 0.41 0.34  CREATININE 0.75  --   --   --     Estimated Creatinine Clearance: 58.5 ml/min (by C-G formula based on Cr of 0.75).  Assessment: 80 y/o male on heparin to bridge Coumadin for hx PE and DVT. INR is subtherapeutic but trending up. Heparin level is therapeutic on 950 units/hr. No bleeding noted, CBC is stable.  Home Coumadin dose was 5 mg daily.  Goal of Therapy:  INR 2-3 Heparin level 0.3-0.7 units/ml Monitor platelets by anticoagulation protocol: Yes   Plan:  -Continue heparin drip at 950 units/hr -Coumadin to 7.5 mg x 1 today -Daily heparin level, CBC and PT/INR  [facility], [place].D., BCPS Clinical Pharmacist Pager: [phone] 04/16/2013 10:51 AM

## 2013-04-16 NOTE — Care Management Note (Unsigned)
    Page 1 of 2   04/19/2013     10:20:21 AM   CARE MANAGEMENT NOTE 04/19/2013  Patient:  Scott [name], Scott [name]   Account Number:  [id number]  Date Initiated:  04/16/2013  Documentation initiated by:  Marvetta Gibbons  Subjective/Objective Assessment:   PT admitted with  pulmonary embolus and congestive heart failure     Action/Plan:   PTA pt lived at home wtih spouse- plan to return home when INR > 2   Anticipated DC Date:  04/18/2013   Anticipated DC Plan:  [place]  CM consult      [facility] Choice  Resumption Of Svcs/PTA Provider   Choice offered to / List presented to:          [facility] arranged  HH-1 RN  [place].   Status of service:  Completed, signed off Medicare Important Message given?   (If response is "NO", the following Medicare IM given date fields will be blank) Date Medicare IM given:   Date Additional Medicare IM given:    Discharge Disposition:  [place]  Per UR Regulation:  Reviewed for med. necessity/level of care/duration of stay  If discussed at [place] of Stay Meetings, dates discussed:   04/17/2013  04/19/2013    Comments:  04-19-13 [address], [place] [phone] CM has benefits check in process for brand name coumadin. CM will make pt aware once completed. Will need resumption orders for [facility].  No further needs from CM at this time.    04-18-13 [address] [place], [place] [phone] CM did speak to pt's son in regards to home lovenox. Pt and son not willing to do home lovenox at this time. Wishes to stay on IV heparin until INR >2. CM was able to speak to PA this am and she spoke to son. Pt will need resumption orders for [facility] once stable for d/c. CM will continue to monitor.    04/16/13- 1620- Marvetta Gibbons RN, BSN [phone] Plan is for pt to return home when INR >2 - remains on IV heparin to coumadin bridge- pt was  active with [facility] for HH-RN- will need resumption order at time of discharge.

## 2013-04-16 NOTE — Progress Notes (Signed)
    SUBJECTIVE:  No dyspnea or chest pain.  PHYSICAL EXAM Filed Vitals:   04/15/13 0923 04/15/13 1316 04/15/13 2100 04/16/13 0500  BP: 134/57 119/72 123/55 126/80  Pulse: 79 55 64 65  Temp:  98.5 F (36.9 C) 98.8 F (37.1 C) 98.8 F (37.1 C)  TempSrc:  Oral    Resp:  16 21 20   Height:      Weight:      SpO2:  97% 99% 98%   General:  No distress Neck: supple Lungs:  Clear Heart:  RRR Abdomen:  Positive bowel sounds, no rebound no guarding Extremities:  No edema  LABS: Lab Results  Component Value Date   TROPONINI 3.07* 04/13/2013   Results for orders placed during the hospital encounter of 04/10/13 (from the past 24 hour(s))  HEPARIN LEVEL (UNFRACTIONATED)     Status: None   Collection Time    04/15/13  9:30 AM      Result Value Range   Heparin Unfractionated 0.41  0.30 - 0.70 IU/mL  PROTIME-INR     Status: Abnormal   Collection Time    04/16/13  5:15 AM      Result Value Range   Prothrombin Time 19.5 (*) 11.6 - 15.2 seconds   INR 1.70 (*) 0.00 - 1.49  CBC     Status: Abnormal   Collection Time    04/16/13  5:15 AM      Result Value Range   WBC 3.4 (*) 4.0 - 10.5 K/uL   RBC 3.90 (*) 4.22 - 5.81 MIL/uL   Hemoglobin 11.3 (*) 13.0 - 17.0 g/dL   HCT 33.4 (*) 39.0 - 52.0 %   MCV 85.6  78.0 - 100.0 fL   MCH 29.0  26.0 - 34.0 pg   MCHC 33.8  30.0 - 36.0 g/dL   RDW 14.8  11.5 - 15.5 %   Platelets 183  150 - 400 K/uL  HEPARIN LEVEL (UNFRACTIONATED)     Status: None   Collection Time    04/16/13  5:15 AM      Result Value Range   Heparin Unfractionated 0.34  0.30 - 0.70 IU/mL    Intake/Output Summary (Last 24 hours) at 04/16/13 0751 Last data filed at 04/15/13 2212  Gross per 24 hour  Intake    240 ml  Output    200 ml  Net     40 ml     ASSESSMENT AND PLAN:  ELEVATED TROPONIN:  Elevation felt most likely to a combination of pulmonary embolus and congestive heart failure. No further ischemia evaluation planned.  NSVT:  Medical management. Continue beta  blocker. Not a candidate for ICD given advanced age.  NICM: Previous catheterization showed nonobstructive coronary disease. Continue beta blocker. Discontinue Norvasc. Add lisinopril 2.5 mg daily.  ACUTE ON CHRONIC SYSTOLIC HF:   Plan is to discharge home on 20 mg of Lasix daily.  PE:  INR remains less than 2. Continue heparin until coumadin therapeutic.    DISPOSITION:   Home when INR is greater than 2.     Kirk Ruths 04/16/2013 7:51 AM

## 2013-04-16 NOTE — Discharge Instructions (Signed)
Information on my medicine - Coumadin   (Warfarin)  This medication education was reviewed with me or my healthcare representative as part of my discharge preparation.  The pharmacist that spoke with me during my hospital stay was:  [facility], Margot Chimes, [facility]  Why was Coumadin prescribed for you? Coumadin was prescribed for you because you have a blood clot or a medical condition that can cause an increased risk of forming blood clots. Blood clots can cause serious health problems by blocking the flow of blood to the heart, lung, or brain. Coumadin can prevent harmful blood clots from forming. As a reminder your indication for Coumadin is:   Pulmonary Embolism Treatment  What test will check on my response to Coumadin? While on Coumadin (warfarin) you will need to have an INR test regularly to ensure that your dose is keeping you in the desired range. The INR (international normalized ratio) number is calculated from the result of the laboratory test called prothrombin time (PT).  If an INR APPOINTMENT HAS NOT ALREADY BEEN MADE FOR YOU please schedule an appointment to have this lab work done by your health care provider within 7 days. Your INR goal is usually a number between:  2 to 3 or your provider may give you a more narrow range like 2-2.5.  Ask your health care provider during an office visit what your goal INR is.  What  do you need to  know  About  COUMADIN? Take Coumadin (warfarin) exactly as prescribed by your healthcare provider about the same time each day.  DO NOT stop taking without talking to the doctor who prescribed the medication.  Stopping without other blood clot prevention medication to take the place of Coumadin may increase your risk of developing a new clot or stroke.  Get refills before you run out.  What do you do if you miss a dose? If you miss a dose, take it as soon as you remember on the same day then continue your regularly scheduled regimen the next day.  Do  not take two doses of Coumadin at the same time.  Important Safety Information A possible side effect of Coumadin (Warfarin) is an increased risk of bleeding. You should call your healthcare provider right away if you experience any of the following:   Bleeding from an injury or your nose that does not stop.   Unusual colored urine (red or dark brown) or unusual colored stools (red or black).   Unusual bruising for unknown reasons.   A serious fall or if you hit your head (even if there is no bleeding).  Some foods or medicines interact with Coumadin (warfarin) and might alter your response to warfarin. To help avoid this:   Eat a balanced diet, maintaining a consistent amount of Vitamin K.   Notify your provider about major diet changes you plan to make.   Avoid alcohol or limit your intake to 1 drink for women and 2 drinks for men per day. (1 drink is 5 oz. wine, 12 oz. beer, or 1.5 oz. liquor.)  Make sure that ANY health care provider who prescribes medication for you knows that you are taking Coumadin (warfarin).  Also make sure the healthcare provider who is monitoring your Coumadin knows when you have started a new medication including herbals and non-prescription products.  Coumadin (Warfarin)  Major Drug Interactions  Increased Warfarin Effect Decreased Warfarin Effect  Alcohol (large quantities) Antibiotics (esp. Septra/Bactrim, Flagyl, Cipro) Amiodarone (Cordarone) Aspirin (ASA) Cimetidine (Tagamet)  Megestrol (Megace) NSAIDs (ibuprofen, naproxen, etc.) Piroxicam (Feldene) Propafenone (Rythmol SR) Propranolol (Inderal) Isoniazid (INH) Posaconazole (Noxafil) Barbiturates (Phenobarbital) Carbamazepine (Tegretol) Chlordiazepoxide (Librium) Cholestyramine (Questran) Griseofulvin Oral Contraceptives Rifampin Sucralfate (Carafate) Vitamin K   Coumadin (Warfarin) Major Herbal Interactions  Increased Warfarin Effect Decreased Warfarin Effect  Garlic Ginseng Ginkgo  biloba Coenzyme Q10 Green tea St. Johns wort    Coumadin (Warfarin) FOOD Interactions  Eat a consistent number of servings per week of foods HIGH in Vitamin K (1 serving =  cup)  Collards (cooked, or boiled & drained) Kale (cooked, or boiled & drained) Mustard greens (cooked, or boiled & drained) Parsley *serving size only =  cup Spinach (cooked, or boiled & drained) Swiss chard (cooked, or boiled & drained) Turnip greens (cooked, or boiled & drained)  Eat a consistent number of servings per week of foods MEDIUM-HIGH in Vitamin K (1 serving = 1 cup)  Asparagus (cooked, or boiled & drained) Broccoli (cooked, boiled & drained, or raw & chopped) Brussel sprouts (cooked, or boiled & drained) *serving size only =  cup Lettuce, raw (green leaf, endive, romaine) Spinach, raw Turnip greens, raw & chopped   These websites have more information on Coumadin (warfarin):  FailFactory.se; VeganReport.com.au;

## 2013-04-16 NOTE — Progress Notes (Signed)
[facility]  Patient Status: Active (receiving services up to time of hospitalization)  [facility] is providing the following services: RN  If patient discharges after hours, please call [phone].   Scott [name] 04/16/2013, 10:41 AM

## 2013-04-16 NOTE — Progress Notes (Signed)
Physical Therapy Treatment Patient Details Name: Scott [name] MRN: [id number] DOB: Nov 28, 1921 Today's Date: 04/16/2013 Time: 4696-2952 PT Time Calculation (min): 19 min  PT Assessment / Plan / Recommendation  History of Present Illness Patient is an 80 yo male admitted with chest pain and elevated troponins.  Patient with acute on chronic HF, ? pulmonary emboli.  Recent h/o DVT's.   PT Comments   Pt progressing well with mobility.  He was able to walk without the RW today with only mildly staggering gait pattern.  He would benefit from DGI test next session and working on dynamic balance during gait.    Follow Up Recommendations  No PT follow up;Supervision - Intermittent     Does the patient have the potential to tolerate intense rehabilitation    NA  Barriers to Discharge   None      Equipment Recommendations  None recommended by PT    Recommendations for Other Services   None  Frequency Min 3X/week   Progress towards PT Goals Progress towards PT goals: Progressing toward goals  Plan Current plan remains appropriate    Precautions / Restrictions Precautions Precautions: None   Pertinent Vitals/Pain See vitals flow sheet.    Mobility  Bed Mobility Bed Mobility: Not assessed (pt seated EOB eating dinner) Transfers Sit to Stand: 6: Modified independent (Device/Increase time);With upper extremity assist;From bed Stand to Sit: 6: Modified independent (Device/Increase time);With upper extremity assist;To bed Details for Transfer Assistance: used arms to control trunk during transitions Ambulation/Gait Ambulation/Gait Assistance: 5: Supervision Ambulation Distance (Feet): 450 Feet Assistive device: Rolling walker;None Ambulation/Gait Assistance Details: Pt was walking so well with RW during my session I asked if he used one at home and he said no, so I had him walk without it.  Staggering at times when looking in rooms or walking and talking to me, but not so much that he needed  external assist to keep his balance.   Gait Pattern: Step-through pattern (mildly staggering when multi tasking without RW)      PT Goals (current goals can now be found in the care plan section) Acute Rehab PT Goals Patient Stated Goal: To go home soon  Visit Information  Last PT Received On: 04/16/13 Assistance Needed: +1 History of Present Illness: Patient is an 80 yo male admitted with chest pain and elevated troponins.  Patient with acute on chronic HF, ? pulmonary emboli.  Recent h/o DVT's.    Subjective Data  Subjective: Pt reports he did not use a RW PTA and he was not active with [facility] for PT.   Patient Stated Goal: To go home soon   Cognition  Cognition Arousal/Alertness: Awake/alert Behavior During Therapy: Flat affect Overall Cognitive Status: Within Functional Limits for tasks assessed    Balance  Balance Balance Assessed: Yes Static Sitting Balance Static Sitting - Balance Support: Feet supported Static Sitting - Level of Assistance: 7: Independent Static Standing Balance Static Standing - Balance Support: No upper extremity supported Static Standing - Level of Assistance: 5: Stand by assistance Dynamic Standing Balance Dynamic Standing - Balance Support: No upper extremity supported Dynamic Standing - Level of Assistance: 5: Stand by assistance  End of Session PT - End of Session Activity Tolerance: Patient tolerated treatment well Patient left: in bed;with call bell/phone within reach;with family/visitor present (seated EOB)     Wells Guiles B. Flora, [place], DPT [phone]   04/16/2013, 6:12 PM

## 2013-04-16 NOTE — Progress Notes (Signed)
Utilization review completed.  

## 2013-04-17 DIAGNOSIS — I428 Other cardiomyopathies: Secondary | ICD-10-CM | POA: Diagnosis present

## 2013-04-17 DIAGNOSIS — I2699 Other pulmonary embolism without acute cor pulmonale: Principal | ICD-10-CM

## 2013-04-17 DIAGNOSIS — I4729 Other ventricular tachycardia: Secondary | ICD-10-CM | POA: Diagnosis not present

## 2013-04-17 DIAGNOSIS — I472 Ventricular tachycardia: Secondary | ICD-10-CM | POA: Diagnosis not present

## 2013-04-17 DIAGNOSIS — Z86711 Personal history of pulmonary embolism: Secondary | ICD-10-CM | POA: Diagnosis present

## 2013-04-17 LAB — CBC
HCT: 32.6 % — ABNORMAL LOW (ref 39.0–52.0)
Hemoglobin: 11.1 g/dL — ABNORMAL LOW (ref 13.0–17.0)
MCH: 29.2 pg (ref 26.0–34.0)
MCHC: 34 g/dL (ref 30.0–36.0)
MCV: 85.8 fL (ref 78.0–100.0)
Platelets: 185 10*3/uL (ref 150–400)
RBC: 3.8 MIL/uL — ABNORMAL LOW (ref 4.22–5.81)
RDW: 15.3 % (ref 11.5–15.5)
WBC: 3.2 10*3/uL — ABNORMAL LOW (ref 4.0–10.5)

## 2013-04-17 LAB — PROTIME-INR
INR: 1.66 — ABNORMAL HIGH (ref 0.00–1.49)
Prothrombin Time: 19.1 seconds — ABNORMAL HIGH (ref 11.6–15.2)

## 2013-04-17 LAB — HEPARIN LEVEL (UNFRACTIONATED)
Heparin Unfractionated: 0.22 IU/mL — ABNORMAL LOW (ref 0.30–0.70)
Heparin Unfractionated: 0.31 IU/mL (ref 0.30–0.70)

## 2013-04-17 MED ORDER — WARFARIN SODIUM 10 MG PO TABS
10.0000 mg | ORAL_TABLET | Freq: Once | ORAL | Status: AC
  Administered 2013-04-17: 10 mg via ORAL
  Filled 2013-04-17 (×2): qty 1

## 2013-04-17 NOTE — Progress Notes (Signed)
ANTICOAGULATION CONSULT NOTE - Follow Up Consult  Pharmacy Consult for heparin Indication: h/o PE/DVT  Labs:  Recent Labs  04/15/13 0140 04/15/13 0930 04/16/13 0515 04/17/13 0445  HGB 12.8*  --  11.3* 11.1*  HCT 37.1*  --  33.4* 32.6*  PLT 210  --  183 185  LABPROT 18.2*  --  19.5* 19.1*  INR 1.55*  --  1.70* 1.66*  HEPARINUNFRC 0.36 0.41 0.34 0.22*    Assessment: 80yo male now subtherapeutic on heparin after two levels at goal though trending down.  Goal of Therapy:  Heparin level 0.3-0.7 units/ml   Plan:  Will increase heparin gtt by 2 units/kg/hr to 1100 units/hr and check level in [place], PharmD, BCPS  04/17/2013,7:17 AM

## 2013-04-17 NOTE — Progress Notes (Signed)
Patient Name: Scott [name] Date of Encounter: 04/17/2013   Principal Problem:   Acute pulmonary embolism Active Problems:   Acute on chronic systolic CHF (congestive heart failure)   NICM (nonischemic cardiomyopathy)   HTN (hypertension)   Elevated troponin   NSVT (nonsustained ventricular tachycardia)   Hyperlipidemia    SUBJECTIVE  No chest pain or sob.    CURRENT MEDS . donepezil  5 mg Oral QHS  . latanoprost  1 drop Both Eyes QHS  . lisinopril  2.5 mg Oral Daily  . metoprolol succinate  50 mg Oral Daily  . mupirocin ointment  1 application Nasal BID  . potassium chloride  10 mEq Oral Daily  . simvastatin  10 mg Oral q1800  . sodium chloride  3 mL Intravenous Q12H  . tamsulosin  0.4 mg Oral Daily  . Warfarin - Pharmacist Dosing Inpatient   Does not apply q1800    OBJECTIVE  Filed Vitals:   04/16/13 0500 04/16/13 1808 04/16/13 2100 04/17/13 0530  BP: 126/80  102/61 132/77  Pulse: 65 73 60 66  Temp: 98.8 F (37.1 C)  97.4 F (36.3 C) 98.6 F (37 C)  TempSrc:   Oral Oral  Resp: 20  16 16   Height:      Weight:      SpO2: 98%  100% 97%    Intake/Output Summary (Last 24 hours) at 04/17/13 0945 Last data filed at 04/17/13 0900  Gross per 24 hour  Intake    360 ml  Output    625 ml  Net   -265 ml   Filed Weights   04/10/13 1236 04/11/13 2149 04/14/13 0416  Weight: 160 lb (72.576 kg) 150 lb 11.2 oz (68.357 kg) 148 lb 9.6 oz (67.405 kg)    PHYSICAL EXAM  General: Pleasant, NAD. Neuro: Alert and oriented X 3. Moves all extremities spontaneously. Psych: Normal affect. HEENT:  Normal  Neck: Supple without bruits or JVD. Lungs:  Resp regular and unlabored, CTA. Heart: RRR no s3, s4, or murmurs. Abdomen: Soft, non-tender, non-distended, BS + x 4.  Extremities: No clubbing, cyanosis or edema. DP/PT/Radials 2+ and equal bilaterally.  Accessory Clinical Findings  CBC  Recent Labs  04/16/13 0515 04/17/13 0445  WBC 3.4* 3.2*  HGB 11.3* 11.1*  HCT  33.4* 32.6*  MCV 85.6 85.8  PLT 183 185   Lab Results  Component Value Date   INR 1.66* 04/17/2013   INR 1.70* 04/16/2013   INR 1.55* 04/15/2013    TELE  Sinus rhythm, 9 beats wct tachycardia.  ASSESSMENT AND PLAN  1.  Pulmonary embolism:  CTA showed LUL/RML filling defect - acute vs chronic PE.  He is not having any further chest pain or dyspnea.  He remains on heparin and coumadin.  INR down today to 1.66.  I briefly discussed the use of lovenox bridging as an outpt.  His family reports that he has been on that before in the past and that a family member gave the injections at that time.  His son is his POA and is flying in this afternoon.  We can readdress this tomorrow morning if INR remains subRx.  2.  Acute on chronic systolic chf/nicm:  Volume is stable.  Cont bb/acei.  Minus 6 liters this admission.  Wt was 148 on 1/3.  F/u weight and bmet in am.  He is not currently on lasix.  3.  Troponin elevation:  Interesting trend.  Up and down, peaking at 7.82.  Suspect  2/2 PE/CHF.  No further cardiac eval in light of prior h/o nonobs CAD.  Cont bb and statin.  He is not on asa b/c coumadin was initiated for #1.  4.  NSVT:  Asymptomatic.  Cont bb.  Not a candidate for ICD 2/2 advanced age.  5.  Dispo:  D/c once INR therapeutic.  Family will consider outpt lovenox bridging with plan to f/u INR @ [facility].  Signed, Murray Hodgkins NP As above, patient seen and examined. He denies dyspnea or chest pain. Plan to continue heparin until Coumadin is therapeutic. Continue beta blocker and ACE inhibitor for cardiomyopathy. ACE inhibitor can be titrated as an outpatient. Kirk Ruths

## 2013-04-17 NOTE — Progress Notes (Signed)
Met with patient at bedside to offer [facility] services. Patient had been followed in the past for [facility] services but did not return calls. He reports he is agreeable to [facility] services again. Consents signed. Left packet at bedside. He will receive post hospital discharge call and will be evaluated for monthly visits. Marthenia Rolling, MSN, RN, BSN- [facility] [phone]

## 2013-04-17 NOTE — Progress Notes (Signed)
Spoke with son regarding pt being discharged with lovenox. Son stated that a caregiver would be able to administer. Son Scott [name] wishes to be contacted about discharge plans, pt wife is a paraplegic unable to make medical decisions for pt. Scott Kinds RN

## 2013-04-17 NOTE — Telephone Encounter (Signed)
Patient was hospitalized

## 2013-04-17 NOTE — Progress Notes (Signed)
ANTICOAGULATION CONSULT NOTE - Follow Up Consult  Pharmacy Consult for Heparin and Coumadin Indication: hx DVT/PE  Allergies  Allergen Reactions  . Lipitor [Atorvastatin] Other (See Comments)    unknown    Patient Measurements: Height: 5\' 7"  (170.2 cm) Weight: 148 lb 9.6 oz (67.405 kg) IBW/kg (Calculated) : 66.1 Heparin Dosing Weight: 67.4 kg  Vital Signs: Temp: 98.9 F (37.2 C) (01/06 1450) Temp src: Oral (01/06 1450) BP: 121/65 mmHg (01/06 1450) Pulse Rate: 69 (01/06 1450)  Labs:  Recent Labs  04/15/13 0140  04/16/13 0515 04/17/13 0445 04/17/13 1510  HGB 12.8*  --  11.3* 11.1*  --   HCT 37.1*  --  33.4* 32.6*  --   PLT 210  --  183 185  --   LABPROT 18.2*  --  19.5* 19.1*  --   INR 1.55*  --  1.70* 1.66*  --   HEPARINUNFRC 0.36  < > 0.34 0.22* 0.31  < > = values in this interval not displayed.  Estimated Creatinine Clearance: 58.5 ml/min (by C-G formula based on Cr of 0.75).   Medications:  Infusions:  . heparin 1,100 Units/hr (04/17/13 3570)    Assessment: 80 y/o male on heparin to bridge Coumadin for hx PE and DVT. INR is subtherapeutic and went down again despite higher doses than he took at home. Heparin level is therapeutic on low end of range at 1100 units/hr. No bleeding noted, CBC is stable.  Goal of Therapy:  INR 2-3 Heparin level 0.3-0.7 units/ml Monitor platelets by anticoagulation protocol: Yes   Plan:  -Increase heparin drip to 1200 units/hr -Coumadin 10 mg PO tonight -INR, heparin level and CBC daily -Monitor for s/sx bleeding  [facility], Pharm.D., BCPS Clinical Pharmacist Pager: [phone] 04/17/2013 3:50 PM

## 2013-04-18 LAB — HEPARIN LEVEL (UNFRACTIONATED): Heparin Unfractionated: 0.45 IU/mL (ref 0.30–0.70)

## 2013-04-18 LAB — BASIC METABOLIC PANEL
BUN: 14 mg/dL (ref 6–23)
CO2: 19 mEq/L (ref 19–32)
Calcium: 9.7 mg/dL (ref 8.4–10.5)
Chloride: 105 mEq/L (ref 96–112)
Creatinine, Ser: 0.72 mg/dL (ref 0.50–1.35)
GFR calc Af Amer: >90 mL/min (ref >90)
GFR calc non Af Amer: 80 mL/min — ABNORMAL LOW (ref >90)
Glucose, Bld: 93 mg/dL (ref 70–99)
Potassium: 4.1 mEq/L (ref 3.7–5.3)
Sodium: 138 mEq/L (ref 137–147)

## 2013-04-18 LAB — CBC
HCT: 30.6 % — ABNORMAL LOW (ref 39.0–52.0)
Hemoglobin: 10.6 g/dL — ABNORMAL LOW (ref 13.0–17.0)
MCH: 29.9 pg (ref 26.0–34.0)
MCHC: 34.6 g/dL (ref 30.0–36.0)
MCV: 86.2 fL (ref 78.0–100.0)
Platelets: 170 10*3/uL (ref 150–400)
RBC: 3.55 MIL/uL — ABNORMAL LOW (ref 4.22–5.81)
RDW: 15.2 % (ref 11.5–15.5)
WBC: 3.2 10*3/uL — ABNORMAL LOW (ref 4.0–10.5)

## 2013-04-18 LAB — PROTIME-INR
INR: 1.76 — ABNORMAL HIGH (ref 0.00–1.49)
Prothrombin Time: 20 seconds — ABNORMAL HIGH (ref 11.6–15.2)

## 2013-04-18 MED ORDER — FUROSEMIDE 20 MG PO TABS
20.0000 mg | ORAL_TABLET | Freq: Every day | ORAL | Status: DC
  Administered 2013-04-18 – 2013-04-19 (×2): 20 mg via ORAL
  Filled 2013-04-18 (×2): qty 1

## 2013-04-18 MED ORDER — WARFARIN SODIUM 10 MG PO TABS
10.0000 mg | ORAL_TABLET | Freq: Once | ORAL | Status: AC
  Administered 2013-04-18: 10 mg via ORAL
  Filled 2013-04-18: qty 1

## 2013-04-18 MED ORDER — HEPARIN (PORCINE) IN NACL 100-0.45 UNIT/ML-% IJ SOLN
1200.0000 [IU]/h | INTRAMUSCULAR | Status: DC
  Administered 2013-04-18: 1200 [IU]/h via INTRAVENOUS
  Filled 2013-04-18 (×3): qty 250

## 2013-04-18 MED ORDER — ENOXAPARIN SODIUM 80 MG/0.8ML ~~LOC~~ SOLN
70.0000 mg | Freq: Two times a day (BID) | SUBCUTANEOUS | Status: DC
  Filled 2013-04-18 (×2): qty 0.8

## 2013-04-18 NOTE — Progress Notes (Signed)
I updated patient's son on his decision via telephone, while in the patient's room. Karsin Pesta PA-C

## 2013-04-18 NOTE — Progress Notes (Signed)
Physical Therapy Treatment Patient Details Name: Scott [name] MRN: [id number] DOB: 08-20-1921 Today's Date: 04/18/2013 Time: 5638-9373 PT Time Calculation (min): 17 min  PT Assessment / Plan / Recommendation  History of Present Illness Patient is an 80 yo male admitted with chest pain and elevated troponins.  Patient with acute on chronic HF, ? pulmonary emboli.  Recent h/o DVT's.   PT Comments   Pt is progressing well with his mobility.  Wants to use walker today during gait (we went without for some of gait last session, but he feels safer with it today).  HR remained stable during gait and no reports of SOB, pain or lightheadedness.    Follow Up Recommendations  No PT follow up;Supervision - Intermittent     Does the patient have the potential to tolerate intense rehabilitation    NA  Barriers to Discharge   None      Equipment Recommendations  None recommended by PT    Recommendations for Other Services   None  Frequency Min 3X/week   Progress towards PT Goals Progress towards PT goals: Progressing toward goals  Plan Current plan remains appropriate    Precautions / Restrictions Precautions Precautions: None   Pertinent Vitals/Pain See vitals flow sheet.     Mobility  Bed Mobility  Overal bed mobility Modified Independent  General bed mobility comments using railing   Transfers  Overall transfer level Modified independent  Equipment used Rolling walker (2 wheeled)  General transfer comment used arms for transitions     Ambulation/Gait Ambulation Distance (Feet): 250 Feet Gait velocity: decreased General Gait Details: HR in the 80s during gait.  No reports of pain, SOB or lightheadedness.  Pt wanted to use the walker today when walking in the hallway instead of going without.  He does report he has one at home if he feels he needs the support at home.  He did walk around the room with min guard assist for safety using furniture for stability and balance in the tight  spaces.        PT Goals (current goals can now be found in the care plan section) Acute Rehab PT Goals Patient Stated Goal: To go home soon  Visit Information  Last PT Received On: 04/18/13 Assistance Needed: +1 History of Present Illness: Patient is an 80 yo male admitted with chest pain and elevated troponins.  Patient with acute on chronic HF, ? pulmonary emboli.  Recent h/o DVT's.    Subjective Data  Subjective: Pt is frustrated with still being in the hospital to watch a "number" (re: getting his INR therapeutic).   Patient Stated Goal: To go home soon   Cognition  Cognition Arousal/Alertness: Awake/alert Behavior During Therapy: Flat affect Overall Cognitive Status: Within Functional Limits for tasks assessed       End of Session PT - End of Session Equipment Utilized During Treatment: Gait belt Activity Tolerance: Patient tolerated treatment well Patient left: in chair;with call bell/phone within reach       [place] B. [name], PT, DPT [phone]   04/18/2013, 11:39 AM

## 2013-04-18 NOTE — Progress Notes (Addendum)
Patient: Scott [name] / Admit Date: 04/10/2013 / Date of Encounter: 04/18/2013, 8:11 AM   Subjective  Feels well, no complaints. I had long discussion with patient. He does NOT want to do Lovenox shots at home. He would prefer to be treated in the hospital and get blood thinned here. He is OK with Lovenox as inpatient but is wary of doing this at home even with caregiver support.  Objective   Telemetry: NSR, occ PVCs, 6 beats NSVT  Physical Exam: Blood pressure 129/65, pulse 64, temperature 98.8 F (37.1 C), temperature source Oral, resp. rate 18, height 5\' 7"  (1.702 m), weight 153 lb 9.6 oz (69.673 kg), SpO2 98.00%. General: Well developed, well nourished AAM, in no acute distress. Head: Normocephalic, atraumatic, sclera non-icteric, no xanthomas, nares are without discharge. Neck: JVP not elevated. Lungs: Clear bilaterally to auscultation without wheezes, rales, or rhonchi. Breathing is unlabored. Heart: RRR S1 S2 without murmurs, rubs, or gallops.  Abdomen: Soft, non-tender, non-distended with normoactive bowel sounds. No rebound/guarding. Extremities: No clubbing or cyanosis. No edema. Distal pedal pulses are 2+ and equal bilaterally. Neuro: Alert and oriented X 3. Moves all extremities spontaneously. Psych:  Responds to questions appropriately with a normal affect.   Intake/Output Summary (Last 24 hours) at 04/18/13 0811 Last data filed at 04/18/13 0802  Gross per 24 hour  Intake    715 ml  Output    850 ml  Net   -135 ml    Inpatient Medications:  . donepezil  5 mg Oral QHS  . latanoprost  1 drop Both Eyes QHS  . lisinopril  2.5 mg Oral Daily  . metoprolol succinate  50 mg Oral Daily  . simvastatin  10 mg Oral q1800  . sodium chloride  3 mL Intravenous Q12H  . tamsulosin  0.4 mg Oral Daily  . Warfarin - Pharmacist Dosing Inpatient   Does not apply q1800   Infusions:  . heparin 1,200 Units/hr (04/18/13 0600)    Labs: No results found for this basename: NA, K, CL,  CO2, GLUCOSE, BUN, CREATININE, CALCIUM, MG, PHOS,  in the last 72 hours No results found for this basename: AST, ALT, ALKPHOS, BILITOT, PROT, ALBUMIN,  in the last 72 hours  Recent Labs  04/17/13 0445 04/18/13 0545  WBC 3.2* 3.2*  HGB 11.1* 10.6*  HCT 32.6* 30.6*  MCV 85.8 86.2  PLT 185 170   No results found for this basename: CKTOTAL, CKMB, TROPONINI,  in the last 72 hours No components found with this basename: POCBNP,  No results found for this basename: HGBA1C,  in the last 72 hours   Radiology/Studies:  Ct Angio Chest W/cm &/or Wo Cm  04/11/2013   CLINICAL DATA:  Chest pain bladder cancer  EXAM: CT ANGIOGRAPHY CHEST WITH CONTRAST  TECHNIQUE: Multidetector CT imaging of the chest was performed using the standard protocol during bolus administration of intravenous contrast. Multiplanar CT image reconstructions including MIPs were obtained to evaluate the vascular anatomy.  CONTRAST:  43mL OMNIPAQUE IOHEXOL 350 MG/ML SOLN  COMPARISON:  CT thorax 12/26/2010  FINDINGS: There small filling defect within the proximal segmental branch of the posterior left upper lobe of pulmonary artery seen best some coronal image 96, series 7. This filling defects is nonocclusive. There is a thin web-like filling defect within the right middle lobe pulmonary artery (image 108, series 6) which is also nonocclusive.  No acute findings of the aorta great vessels. Coronary artery calcifications are present.  There is central peribronchovascular thickening  extends the lower lobes. Bilateral moderate effusions.  Limited view of the upper abdomen is unremarkable. No axillary supraclavicular lymphadenopathy. Six no mediastinal hilar lymphadenopathy. No aggressive osseous lesion.  Review of the MIP images confirms the above findings.  IMPRESSION: 1. Two small nonocclusive filling defects within the upper lobe pulmonary arteries. Differential includes acute pulmonary embolism versus chronic pulmonary embolism. 2. Central  peribronchial vascular thickening and bilateral pleural effusions may represent pulmonary edema and interstitial edema. Cannot exclude a malignant process.   Electronically Signed   By: Suzy Bouchard M.D.   On: 04/11/2013 00:57   Dg Chest Port 1 View  04/10/2013   CLINICAL DATA:  Shortness of Breath  EXAM: PORTABLE CHEST - 1 VIEW  COMPARISON:  03/14/2013  FINDINGS: Cardiomegaly again noted. Again noted mild congestion/pulmonary edema. No focal infiltrate.  IMPRESSION: Again noted mild congestion/pulmonary edema. No segmental infiltrate.   Electronically Signed   By: Lahoma Crocker M.D.   On: 04/10/2013 13:16     Assessment and Plan  1. Elevated troponin with chest pain,  possibly related to PE, CHF - no further cardiac eval in light of prior nonobstructive CAD 06/2012 2. Pulmonary embolism by CT angio this admission - INR still subtherapeutic. He does NOT want home Lovenox. Per Dr Stanford Breed continue inpatient IV heparin. He is active with [facility] and INRs are managed by [facility]. 3. Acute on chronic systolic CHF/NICM, tx with IV Lasix, improved. Resume low dose oral lasix. - EF 40% in 06/2012, down to 20-25% 03/2013 felt possibly due to ongoing EtOH - cath 06/2012 with patent coronaries  4. NSVT, for continued medical therapy. Not a candidate for ICD given advanced age. 5. Bilateral DVT 03/2013 - see anticoag notes above.  Signed, Melina Copa PA-C As above; patient denies CP or dyspnea; INR remains subtherapeutic; continue heparin until > 2. Patient not interested in home lovenox. Kirk Ruths

## 2013-04-18 NOTE — Progress Notes (Signed)
Pharmacist had what appeared to be a productive conversation with patient's son, patient, and patient's wife via phone re:  His anti-coagulation therapy and his current plan of care.

## 2013-04-18 NOTE — Progress Notes (Addendum)
ANTICOAGULATION CONSULT NOTE - Follow Up Consult  Pharmacy Consult for Heparin to Lovenox and Coumadin Indication: recent B/L DVT and PE  Allergies  Allergen Reactions  . Lipitor [Atorvastatin] Other (See Comments)    unknown    Patient Measurements: Height: 5\' 7"  (170.2 cm) Weight: 153 lb 9.6 oz (69.673 kg) IBW/kg (Calculated) : 66.1 Heparin Dosing Weight: 67.4 kg  Vital Signs: Temp: 98.8 F (37.1 C) (01/07 0525) Temp src: Oral (01/07 0525) BP: 129/65 mmHg (01/07 0525) Pulse Rate: 64 (01/07 0525)  Labs:  Recent Labs  04/16/13 0515 04/17/13 0445 04/17/13 1510 04/18/13 0545  HGB 11.3* 11.1*  --  10.6*  HCT 33.4* 32.6*  --  30.6*  PLT 183 185  --  170  LABPROT 19.5* 19.1*  --  20.0*  INR 1.70* 1.66*  --  1.76*  HEPARINUNFRC 0.34 0.22* 0.31 0.45  CREATININE  --   --   --  0.72    Estimated Creatinine Clearance: 58.5 ml/min (by C-G formula based on Cr of 0.72).  Assessment: 80 y/o male on heparin now to transition to Lovenox to facilitate discharge. He is being bridged to Coumadin for recent hx PE and DVT. INR is subtherapeutic this morning. No bleeding noted, Hb is slowly trending down, pltc wnl.  Goal of Therapy:  INR 2-3 Anti Xa level 0.6-1 units/ml 4 hrs post dose Monitor platelets by anticoagulation protocol: Yes   Plan:  -Discontinue heparin drip -Begin Lovenox 70 mg SQ q12h one hour after heparin drip stopped -Coumadin 10 mg PO tonight -INR daily, CBC q72h while on Lovenox -Monitor for s/sx bleeding -Upon discharge would recommend close monitoring of INR  [facility], [place].D., BCPS Clinical Pharmacist Pager: [phone] 04/18/2013 9:16 AM   Addendum: Patient adamant about not having Lovenox shots. Lovenox discontinued and heparin reordered until INR >2 per Dr. Stanford Breed. Heparin level was therapeutic at 0.45 (goal 0.3-0.7) this morning on 1200 units/hr.  Continue heparin drip at 1200 units/ml F/U heparin level in am  [facility], [place].D.,  BCPS Clinical Pharmacist Pager: [phone] 04/18/2013 9:56 AM

## 2013-04-18 NOTE — Progress Notes (Signed)
Pt's son wanting to speak with pharmacist re:  His "concern about the length of stay of my father."  It was discussed at length that the patient's reluctance to take lovenox has lengthened his stay as it is required that he maintain the heparin gtt until he reaches a therapeutic INR.  Son demanding that a pharmacist or MD speak with his wife, who is a Psychologist, sport and exercise, and who knows an internist who is familiar with brand vs. Generic brands of Warfarin.  He feels that this conversation may hasten his father's discharge.

## 2013-04-19 ENCOUNTER — Telehealth: Payer: Self-pay | Admitting: Cardiology

## 2013-04-19 DIAGNOSIS — Z7901 Long term (current) use of anticoagulants: Secondary | ICD-10-CM

## 2013-04-19 LAB — CBC
HCT: 34.6 % — ABNORMAL LOW (ref 39.0–52.0)
Hemoglobin: 11.5 g/dL — ABNORMAL LOW (ref 13.0–17.0)
MCH: 28.8 pg (ref 26.0–34.0)
MCHC: 33.2 g/dL (ref 30.0–36.0)
MCV: 86.7 fL (ref 78.0–100.0)
Platelets: 194 10*3/uL (ref 150–400)
RBC: 3.99 MIL/uL — ABNORMAL LOW (ref 4.22–5.81)
RDW: 15.2 % (ref 11.5–15.5)
WBC: 3.5 10*3/uL — ABNORMAL LOW (ref 4.0–10.5)

## 2013-04-19 LAB — PROTIME-INR
INR: 2.69 — ABNORMAL HIGH (ref 0.00–1.49)
Prothrombin Time: 27.7 seconds — ABNORMAL HIGH (ref 11.6–15.2)

## 2013-04-19 LAB — HEPARIN LEVEL (UNFRACTIONATED): Heparin Unfractionated: 0.35 IU/mL (ref 0.30–0.70)

## 2013-04-19 MED ORDER — WARFARIN SODIUM 5 MG PO TABS: 5.0000 mg | ORAL_TABLET | Freq: Every day | ORAL | Status: DC

## 2013-04-19 MED ORDER — NITROGLYCERIN 0.4 MG SL SUBL: 0.4000 mg | SUBLINGUAL_TABLET | SUBLINGUAL | Status: DC | PRN

## 2013-04-19 MED ORDER — LISINOPRIL 2.5 MG PO TABS: 2.5000 mg | ORAL_TABLET | Freq: Every day | ORAL | Status: DC

## 2013-04-19 MED ORDER — FUROSEMIDE 20 MG PO TABS: 20.0000 mg | ORAL_TABLET | Freq: Every day | ORAL | Status: DC

## 2013-04-19 NOTE — Progress Notes (Addendum)
Subjective:  No complaints of chest pain or SOB  Objective:  Vital Signs in the last 24 hours: Temp:  [97.9 F (36.6 C)-98.8 F (37.1 C)] 97.9 F (36.6 C) (01/08 0441) Pulse Rate:  [61-86] 74 (01/08 0441) Resp:  [18] 18 (01/08 0441) BP: (130-164)/(74-78) 164/74 mmHg (01/08 0441) SpO2:  [97 %-100 %] 100 % (01/08 0441) Weight:  [153 lb 6.4 oz (69.582 kg)] 153 lb 6.4 oz (69.582 kg) (01/08 0441)  Intake/Output from previous day:  Intake/Output Summary (Last 24 hours) at 04/19/13 0827 Last data filed at 04/19/13 0443  Gross per 24 hour  Intake   1082 ml  Output   1350 ml  Net   -268 ml    Physical Exam: General appearance: alert, cooperative, no distress and elderly, frail Lungs: clear to auscultation bilaterally Heart: regular rate and rhythm   Rate: 74  Rhythm: normal sinus rhythm, 8 bets NSVT  Lab Results:  Recent Labs  04/18/13 0545 04/19/13 0530  WBC 3.2* 3.5*  HGB 10.6* 11.5*  PLT 170 194    Recent Labs  04/18/13 0545  NA 138  K 4.1  CL 105  CO2 19  GLUCOSE 93  BUN 14  CREATININE 0.72   No results found for this basename: TROPONINI, CK, MB,  in the last 72 hours  Recent Labs  04/19/13 0530  INR 2.69*    Imaging: Imaging results have been reviewed  Cardiac Studies:  Assessment/Plan:   Principal Problem:   Acute pulmonary embolism Active Problems:   Acute on chronic systolic CHF (congestive heart failure)   Elevated troponin   DVT of lower extremity, bilateral   NSVT (nonsustained ventricular tachycardia)- not an ICD candidate   NICM (nonischemic cardiomyopathy)- EF 20-25% by Echo 04/12/13   Chronic anticoagulation- subtheraputic INR on admission   HTN (hypertension)   Hyperlipidemia   CAD- non obstructive disease by cath 3/14    PLAN: His INR is now theraputic. He has diuresed 7 lbs. Will discuss with MD - ? Is he a candidate a for Xarelto- may offer better compliance and therapeutic coverage There is no contraindication per pharmacy.  Discharge later today (his son cant come till after 4pm) on Lasix 20 mg. Follow up with Dr Percival Spanish.  Kerin Ransom PA-C Beeper [phone] 04/19/2013, 8:27 AM   As above; patient seen and examined; INR now therapeutic; patient can be DCed today on coumadin 5 mg daily; check INR in [facility] 04/23/13. FU with Dr Percival Spanish in [place] in 2-4 weeks. Note telemetry with NSVT; continue beta blocker. >30 min PA and physician time D2 Kirk Ruths

## 2013-04-19 NOTE — Care Management (Addendum)
1332 04-19-13 CM did speak to pt's son in reference to cost of coumadin 31 day supply  $40.00. Mail order cost for 90 day supply will be $80.00. CM did call the [facility] in [place] to make sure that medication is available and it has been filled. Bethena Roys, RN,BSN [phone]

## 2013-04-19 NOTE — Telephone Encounter (Signed)
New problem   7day tcm 05/02/13 @ 11:30 per danya pa calling w/ [facility].

## 2013-04-19 NOTE — Telephone Encounter (Signed)
New problem   Per danya pa pt need to be seen in the [place] office  For 7day TCM 1-2 wk. Please advise of appt.

## 2013-04-19 NOTE — Telephone Encounter (Signed)
Hilda Blades - we can work him in 05/02/13 at 11:30 am in the [place] office.  Please call and make sure this will work for them. Thank you

## 2013-04-20 ENCOUNTER — Telehealth: Payer: Self-pay | Admitting: *Deleted

## 2013-04-20 NOTE — Telephone Encounter (Signed)
Left message to call back  

## 2013-04-20 NOTE — Telephone Encounter (Signed)
Update for 04/20/13 BP 122/64 WT 153.5 INR 2.5

## 2013-04-20 NOTE — Telephone Encounter (Signed)
Continue Coumadin as doing, please document his current dosage and frequency and have his protime rechecked in one week

## 2013-04-23 ENCOUNTER — Ambulatory Visit (INDEPENDENT_AMBULATORY_CARE_PROVIDER_SITE_OTHER): Payer: Medicare Other | Admitting: Pharmacist

## 2013-04-23 ENCOUNTER — Telehealth: Payer: Self-pay | Admitting: *Deleted

## 2013-04-23 DIAGNOSIS — I82403 Acute embolism and thrombosis of unspecified deep veins of lower extremity, bilateral: Secondary | ICD-10-CM

## 2013-04-23 DIAGNOSIS — I82409 Acute embolism and thrombosis of unspecified deep veins of unspecified lower extremity: Secondary | ICD-10-CM

## 2013-04-23 LAB — POCT INR: INR: 2.6

## 2013-04-23 NOTE — Telephone Encounter (Signed)
Protime 31.4 INR 2.6 Pt took 5mg  Fri, sat, and sunday

## 2013-04-23 NOTE — Telephone Encounter (Signed)
Warfarin dose 1/2 tablet on tuesdays, thursdays and saturdays and 1 tablet all other days.  Patient notified (see anticoag note) Left message for Scott [name] with dosing info and order to recheck protime in 1 week.  Also asked Scott [name] to reconfirm warfarin dosing with patient when she goes out later this week.

## 2013-04-23 NOTE — Discharge Summary (Signed)
See progress notes Scott [name]  

## 2013-04-24 NOTE — Telephone Encounter (Signed)
Called as TCM pt.  States he is doing good.  Has HHN and she has set up his medications.  States he has a couple that are running out but nurse is going to reorder.  Appointment on 1/21 at 11:30 in [place] is agreeable to him.  No questions regarding his care.  Will call if he or his nurse has any questions.

## 2013-04-25 ENCOUNTER — Encounter: Payer: Self-pay | Admitting: Family Medicine

## 2013-04-26 ENCOUNTER — Telehealth: Payer: Self-pay | Admitting: *Deleted

## 2013-04-26 ENCOUNTER — Ambulatory Visit (INDEPENDENT_AMBULATORY_CARE_PROVIDER_SITE_OTHER): Payer: Medicare Other | Admitting: Pharmacist

## 2013-04-26 DIAGNOSIS — I82403 Acute embolism and thrombosis of unspecified deep veins of lower extremity, bilateral: Secondary | ICD-10-CM

## 2013-04-26 DIAGNOSIS — I82409 Acute embolism and thrombosis of unspecified deep veins of unspecified lower extremity: Secondary | ICD-10-CM

## 2013-04-26 LAB — POCT INR: INR: 2.8

## 2013-04-26 NOTE — Telephone Encounter (Signed)
Protime 33.4 INR 2.8

## 2013-04-26 NOTE — Telephone Encounter (Signed)
Therapeutic anticoagulation Continue current warfarin dose of 1/2 tablet tuesdays, thursdays and saturdays,  1 tablet all other days. Recheck in 1 week.  Patient notified.  [name] with [place] also notified and order placed to recheck protime next week.

## 2013-05-01 DIAGNOSIS — B351 Tinea unguium: Secondary | ICD-10-CM | POA: Diagnosis not present

## 2013-05-01 DIAGNOSIS — L851 Acquired keratosis [keratoderma] palmaris et plantaris: Secondary | ICD-10-CM | POA: Diagnosis not present

## 2013-05-01 DIAGNOSIS — I70209 Unspecified atherosclerosis of native arteries of extremities, unspecified extremity: Secondary | ICD-10-CM | POA: Diagnosis not present

## 2013-05-02 ENCOUNTER — Encounter: Payer: Self-pay | Admitting: Cardiology

## 2013-05-02 ENCOUNTER — Ambulatory Visit (INDEPENDENT_AMBULATORY_CARE_PROVIDER_SITE_OTHER): Payer: Medicare Other | Admitting: Cardiology

## 2013-05-02 VITALS — BP 165/87 | HR 78 | Ht 67.0 in | Wt 153.0 lb

## 2013-05-02 DIAGNOSIS — I428 Other cardiomyopathies: Secondary | ICD-10-CM

## 2013-05-02 NOTE — Patient Instructions (Signed)
The current medical regimen is effective;  continue present plan and medications.  Please have blood work drawn by your [facility] Nurse at your next blood draw. (BMP - RX written and given to pt)  Follow up with Dr Percival Spanish in 2 months.

## 2013-05-02 NOTE — Progress Notes (Signed)
HPI Scott [name] is a 80 y.o.male with NICM (06/2012 cath with patent cors - EF 40% then, more recently 20-25%), HTN, chronic LBBB, postural syncope related to diuretics, EtOH abuse, and recent DVT who was admitted with CP and elevated troponin. He was seen by EP in March of this year after being admitted with syncope with episodes of asymptomatic NSVT on telemetry. Echo at that time showed LVEF 40% with multiple WMAs, subsequent cath showed patent coronaries. Syncope was thought to be orthostatic and likely related to several medications he was on (HCTZ, tamsulosin, norvasc, metoprolol). He was recently admitted 03/2013 and found to have acute bilateral DVT, thus was placed on Coumadin. His further depressed EF was felt related to ongoing alcohol use.  He was most recently admitted with a slightly elevated troponin and was found to have small pulmonary emboli.  Of note he had some decompensated heart failure on that admission. His INR was subtherapeutic on admission which may have been the reason for her pulmonary emboli.    Since finally going home he actually has done relatively well. He's had no further shortness of breath. He denies any PND or orthopnea. He's had no chest pressure, neck or arm discomfort he said no new weight gain or edema. His weights seem to be steady. He is having his INR followed closely. He is watching   Allergies  Allergen Reactions  . Lipitor [Atorvastatin] Other (See Comments)    unknown    Current Outpatient Prescriptions  Medication Sig Dispense Refill  . bimatoprost (LUMIGAN) 0.03 % ophthalmic solution Place 1 drop into both eyes at bedtime.      . donepezil (ARICEPT ODT) 10 MG disintegrating tablet Take 5 mg by mouth at bedtime.      . enoxaparin (LOVENOX) 80 MG/0.8ML injection       . furosemide (LASIX) 20 MG tablet Take 1 tablet (20 mg total) by mouth daily.  30 tablet  6  . gabapentin (NEURONTIN) 100 MG capsule       . HYDROcodone-acetaminophen (NORCO) 10-325 MG  per tablet Take 1 tablet by mouth every 6 (six) hours as needed for moderate pain.       Marland Kitchen lisinopril (PRINIVIL,ZESTRIL) 20 MG tablet Take 20 mg by mouth daily.      . megestrol (MEGACE) 20 MG tablet Take 20 mg by mouth daily.      . metoprolol succinate (TOPROL-XL) 100 MG 24 hr tablet Take 50 mg by mouth daily. Take with or immediately following a meal.      . nitroGLYCERIN (NITROSTAT) 0.4 MG SL tablet Place 1 tablet (0.4 mg total) under the tongue every 5 (five) minutes as needed for chest pain (up to 3 doses).  25 tablet  3  . simvastatin (ZOCOR) 10 MG tablet Take 10 mg by mouth daily.      Marland Kitchen spironolactone (ALDACTONE) 25 MG tablet       . tamsulosin (FLOMAX) 0.4 MG CAPS capsule Take 0.4 mg by mouth daily.      Marland Kitchen warfarin (COUMADIN) 5 MG tablet Take 1 tablet (5 mg total) by mouth daily at 6 PM. Or as directed. Brand name please.  30 tablet  6   No current facility-administered medications for this visit.    Past Medical History  Diagnosis Date  . HTN (hypertension)   . Hyperlipidemia   . PVCs (premature ventricular contractions)   . Glaucoma   . PVD (peripheral vascular disease)     Right ABI .75, Left .78 (  2006)  . Popliteal artery aneurysm, bilateral DECUMENTED CHRONIC PARTIAL OCCLUSION--  PT DENIES CLAUDICATION OR ANY OTHER SYMPTOMS  . Frequency of urination   . Nocturia   . Bladder cancer 03/15/2012  . Chronic systolic CHF (congestive heart failure)     a. NICM - patent cors 06/2012, EF 40% at that time. b. 03/2013 eval: EF 20-25%.  . DDD (degenerative disc disease)   . Arthritis   . DVT, bilateral lower limbs 03/2013    a. Bilat DVT dx 03/2013.  . Pulmonary embolism     a. By CT angio 04/2013.  Marland Kitchen Elevated troponin     a. Adm 12/30-04/2013 - not felt to represent ACS; ?due to PE. b. Patent cors 06/2012.  Marland Kitchen NSVT (nonsustained ventricular tachycardia)     a. NSVT 06/2012; NSVT also seen during 03/2013 adm. b. Med rx. Not candidate for ICD given adv age.  Marland Kitchen ETOH abuse   . Syncope      a. Felt to be postural syncope related to diuretics 06/2012.    ROS:  As stated in the HPI and negative for all other systems.   PHYSICAL EXAM BP 165/87  Pulse 78  Ht 5\' 7"  (1.702 m)  Wt 153 lb (69.4 kg)  BMI 23.96 kg/m2 GENERAL:  Frail appearing NECK:  No jugular venous distention, waveform within normal limits, carotid upstroke brisk and symmetric, no bruits, no thyromegaly LUNGS:  Clear to auscultation bilaterally BACK:  No CVA tenderness CHEST:  Unremarkable HEART:  PMI not displaced or sustained,S1 and S2 within normal limits, no S3, no S4, no clicks, no rubs, no murmurs ABD:  Flat, positive bowel sounds normal in frequency in pitch, no bruits, no rebound, no guarding, no midline pulsatile mass, no hepatomegaly, no splenomegaly EXT:  2 plus pulses upper and femorals.  Decreased DP/PT bilateral, no edema, no cyanosis no clubbing   ASSESSMENT AND PLAN  Diastolic heart failure - He seems to be euvolemic.  At this point, no change in therapy is indicated.  We have reviewed salt and fluid restrictions.  No further cardiovascular testing is indicated.  PVCs (premature ventricular contractions) -  The patient does have frequent PVCs. He has nonsustained ventricular tachycardia.  He has a reduced ejection fraction. However, the plan is to manage this conservatively without an ICD and I did talk with the patient and family about this in the past.  HTN (hypertension) -  He just took his blood pressure medications.  He says that he takes his blood pressure readings at home and it's in the 295-621 range systolic. Therefore, no change in therapy is indicated.  DVT/PULMONARY EMBOLI-  INR is followed closely by Dr. Laurance Flatten.  No change in therapy is indicated.

## 2013-05-03 ENCOUNTER — Encounter: Payer: Self-pay | Admitting: Pharmacist

## 2013-05-03 ENCOUNTER — Ambulatory Visit (INDEPENDENT_AMBULATORY_CARE_PROVIDER_SITE_OTHER): Payer: Medicare Other | Admitting: Pharmacist

## 2013-05-03 ENCOUNTER — Encounter: Payer: Self-pay | Admitting: Cardiology

## 2013-05-03 ENCOUNTER — Telehealth: Payer: Self-pay | Admitting: *Deleted

## 2013-05-03 DIAGNOSIS — I10 Essential (primary) hypertension: Secondary | ICD-10-CM | POA: Diagnosis not present

## 2013-05-03 DIAGNOSIS — I82409 Acute embolism and thrombosis of unspecified deep veins of unspecified lower extremity: Secondary | ICD-10-CM

## 2013-05-03 DIAGNOSIS — I82403 Acute embolism and thrombosis of unspecified deep veins of lower extremity, bilateral: Secondary | ICD-10-CM

## 2013-05-03 DIAGNOSIS — Z79899 Other long term (current) drug therapy: Secondary | ICD-10-CM | POA: Diagnosis not present

## 2013-05-03 LAB — POCT INR: INR: 4

## 2013-05-03 NOTE — Progress Notes (Signed)
Note also sent to my chart for son

## 2013-05-03 NOTE — Telephone Encounter (Signed)
Protime 48, INR 4.0 , she said it was hard to get his blood today, could be a little dehydrated, she did get a BMP

## 2013-05-03 NOTE — Patient Instructions (Signed)
Anticoagulation Dose Instructions as of 05/03/2013     Scott [name] Tue Wed Thu Fri Sat   New Dose 2.5 mg 5 mg 2.5 mg 2.5 mg 2.5 mg 5 mg 2.5 mg    Description       Hold warfarin today and 1/2 tablet Friday  then decrease dose to 1/2 tablet daily except 1 tablet on Mondays and Fridays      INR was 4.0 today

## 2013-05-03 NOTE — Telephone Encounter (Signed)
Need to verify warfarin dosing.

## 2013-05-07 NOTE — Telephone Encounter (Signed)
Patient is taking warfarin 5mg  /2 tablet tuesdays, thrusdays and saturdays and 1/2 all other days. Dose is decreased to 1/2 tablet daily except 1 tablet mondays and fridays.  Patient and Otila Kluver with [facility] aware.

## 2013-05-08 ENCOUNTER — Ambulatory Visit (INDEPENDENT_AMBULATORY_CARE_PROVIDER_SITE_OTHER): Payer: Medicare Other | Admitting: Pharmacist Clinician (PhC)/ Clinical Pharmacy Specialist

## 2013-05-08 DIAGNOSIS — R5383 Other fatigue: Secondary | ICD-10-CM | POA: Diagnosis not present

## 2013-05-08 DIAGNOSIS — I10 Essential (primary) hypertension: Secondary | ICD-10-CM

## 2013-05-08 DIAGNOSIS — I82409 Acute embolism and thrombosis of unspecified deep veins of unspecified lower extremity: Secondary | ICD-10-CM

## 2013-05-08 DIAGNOSIS — R5381 Other malaise: Secondary | ICD-10-CM

## 2013-05-08 DIAGNOSIS — I82403 Acute embolism and thrombosis of unspecified deep veins of lower extremity, bilateral: Secondary | ICD-10-CM

## 2013-05-08 DIAGNOSIS — K5900 Constipation, unspecified: Secondary | ICD-10-CM

## 2013-05-08 LAB — POCT INR: INR: 5.9

## 2013-05-08 NOTE — Progress Notes (Signed)
Patient's INR has increased from 4.0 to 5.9 despite holding warfarin last week and decreasing dose.  Patient is interested in having [facility] fill his pill boxes weekly to aid in adherence.  I gave him 5mg  of vitamin K in office and instructed him to not take any warfarin today or tomorrow and have his INR rechecked on 1/29 before re-starting warfarin.  Counseled patient on fall prevention and to call if any signs or symptoms of bleeding.  None thus far and no dizziness.  Finger stick quickly clotted.

## 2013-05-10 ENCOUNTER — Ambulatory Visit (INDEPENDENT_AMBULATORY_CARE_PROVIDER_SITE_OTHER): Payer: Medicare Other | Admitting: Pharmacist

## 2013-05-10 DIAGNOSIS — I2699 Other pulmonary embolism without acute cor pulmonale: Secondary | ICD-10-CM

## 2013-05-10 DIAGNOSIS — I82409 Acute embolism and thrombosis of unspecified deep veins of unspecified lower extremity: Secondary | ICD-10-CM

## 2013-05-10 DIAGNOSIS — I82403 Acute embolism and thrombosis of unspecified deep veins of lower extremity, bilateral: Secondary | ICD-10-CM

## 2013-05-10 LAB — POCT INR: INR: 1.5

## 2013-05-10 NOTE — Patient Instructions (Signed)
Anticoagulation Dose Instructions as of 05/10/2013     Scott [name] Tue Wed Thu Fri Sat   New Dose 2.5 mg 2.5 mg 2.5 mg 2.5 mg 2.5 mg 2.5 mg 2.5 mg    Description       Start warfarin back and 1/2 tablet every day.      INR was 1.5 today

## 2013-05-17 ENCOUNTER — Ambulatory Visit (INDEPENDENT_AMBULATORY_CARE_PROVIDER_SITE_OTHER): Payer: Medicare Other | Admitting: Family Medicine

## 2013-05-17 ENCOUNTER — Encounter: Payer: Self-pay | Admitting: Family Medicine

## 2013-05-17 VITALS — BP 146/76 | HR 88 | Temp 97.7°F | Ht 67.0 in | Wt 157.0 lb

## 2013-05-17 DIAGNOSIS — N400 Enlarged prostate without lower urinary tract symptoms: Secondary | ICD-10-CM

## 2013-05-17 DIAGNOSIS — C679 Malignant neoplasm of bladder, unspecified: Secondary | ICD-10-CM | POA: Diagnosis not present

## 2013-05-17 DIAGNOSIS — N401 Enlarged prostate with lower urinary tract symptoms: Secondary | ICD-10-CM

## 2013-05-17 DIAGNOSIS — N138 Other obstructive and reflux uropathy: Secondary | ICD-10-CM

## 2013-05-17 DIAGNOSIS — E559 Vitamin D deficiency, unspecified: Secondary | ICD-10-CM | POA: Diagnosis not present

## 2013-05-17 DIAGNOSIS — Z23 Encounter for immunization: Secondary | ICD-10-CM

## 2013-05-17 DIAGNOSIS — I2510 Atherosclerotic heart disease of native coronary artery without angina pectoris: Secondary | ICD-10-CM

## 2013-05-17 DIAGNOSIS — E785 Hyperlipidemia, unspecified: Secondary | ICD-10-CM | POA: Diagnosis not present

## 2013-05-17 DIAGNOSIS — I82409 Acute embolism and thrombosis of unspecified deep veins of unspecified lower extremity: Secondary | ICD-10-CM

## 2013-05-17 DIAGNOSIS — I10 Essential (primary) hypertension: Secondary | ICD-10-CM | POA: Diagnosis not present

## 2013-05-17 DIAGNOSIS — N139 Obstructive and reflux uropathy, unspecified: Secondary | ICD-10-CM

## 2013-05-17 DIAGNOSIS — Z79899 Other long term (current) drug therapy: Secondary | ICD-10-CM | POA: Insufficient documentation

## 2013-05-17 DIAGNOSIS — I2699 Other pulmonary embolism without acute cor pulmonale: Secondary | ICD-10-CM

## 2013-05-17 DIAGNOSIS — I82403 Acute embolism and thrombosis of unspecified deep veins of lower extremity, bilateral: Secondary | ICD-10-CM

## 2013-05-17 LAB — POCT CBC
Granulocyte percent: 65.6 %G (ref 37–80)
HCT, POC: 39.1 % — AB (ref 43.5–53.7)
Hemoglobin: 12.3 g/dL — AB (ref 14.1–18.1)
Lymph, poc: 0.9 (ref 0.6–3.4)
MCH, POC: 27.4 pg (ref 27–31.2)
MCHC: 31.4 g/dL — AB (ref 31.8–35.4)
MCV: 87.2 fL (ref 80–97)
MPV: 8 fL (ref 0–99.8)
POC Granulocyte: 2.3 (ref 2–6.9)
POC LYMPH PERCENT: 26.8 %L (ref 10–50)
Platelet Count, POC: 221 10*3/uL (ref 142–424)
RBC: 4.5 M/uL — AB (ref 4.69–6.13)
RDW, POC: 16 %
WBC: 3.5 10*3/uL — AB (ref 4.6–10.2)

## 2013-05-17 LAB — POCT INR: INR: 2.8

## 2013-05-17 NOTE — Patient Instructions (Addendum)
Continue current medications. Continue good therapeutic lifestyle changes which include good diet and exercise. Fall precautions discussed with patient. Schedule your flu vaccine if you haven't had it yet If you are over 78 years old - you may need Prevnar 65 or the adult Pneumonia vaccine. BE Sure and take your blood pressure medicine regularly Please dop some blood pressure readings by the office in about a week for [place] to review Continue to keep followup visits with the neurologist and the cardiologist Continue to get your protimes regularly Return the FOBT    Anticoagulation Dose Instructions as of 05/17/2013     Sun Mon Tue Wed Thu Fri Sat   New Dose 2.5 mg 2.5 mg 2.5 mg 2.5 mg Hold 2.5 mg 2.5 mg    Description       Change to no warfarin on thursdays and 1/2 tablet all other days.      INR was 2.8 today

## 2013-05-17 NOTE — Addendum Note (Signed)
Addended by: Zannie Cove on: 05/17/2013 10:53 AM   Modules accepted: Orders

## 2013-05-17 NOTE — Progress Notes (Signed)
Subjective:    Patient ID: Scott [name], male    DOB: [birth year], 80 y.o.   MRN: [id number]  HPI Pt here for follow up and management of chronic medical problems. The patient comes in today with a history of multiple medical problems. He has a history of bladder cancer, DVT with pulmonary embolism, heart irregularity, coronary artery disease, and hypertension. It is of note today that his blood pressure is very elevated this will be rechecked during the visit. He is due to return and FOBT and get lab work. He is also due to get a Prevnar vaccine and this will be discussed with him he will get his INR done today also.         Patient Active Problem List   Diagnosis Date Noted  . Chronic anticoagulation- subtheraputic INR on admission 04/19/2013  . Acute pulmonary embolism 04/17/2013  . NSVT (nonsustained ventricular tachycardia)- not an ICD candidate 04/17/2013  . NICM (nonischemic cardiomyopathy)- EF 20-25% by Echo 04/12/13 04/17/2013  . Elevated troponin 04/10/2013  . Acute on chronic systolic CHF (congestive heart failure) 03/15/2013  . DVT of lower extremity, bilateral 03/15/2013  . Fatigue 11/02/2012  . Constipation 11/02/2012  . Alcohol abuse 08/27/2012  . Syncope 06/21/2012  . Bladder cancer 03/15/2012  . Benign localized hyperplasia of prostate with urinary obstruction 03/15/2012  . HTN (hypertension)   . Hyperlipidemia   . PVCs (premature ventricular contractions)   . CAD- non obstructive disease by cath 3/14    Outpatient Encounter Prescriptions as of 05/17/2013  Medication Sig  . ciclopirox (PENLAC) 8 % solution   . donepezil (ARICEPT ODT) 10 MG disintegrating tablet Take 5 mg by mouth at bedtime.  . furosemide (LASIX) 20 MG tablet Take 1 tablet (20 mg total) by mouth daily.  Marland Kitchen gabapentin (NEURONTIN) 100 MG capsule   . HYDROcodone-acetaminophen (NORCO) 10-325 MG per tablet Take 1 tablet by mouth every 6 (six) hours as needed for moderate pain.   Marland Kitchen lisinopril  (PRINIVIL,ZESTRIL) 20 MG tablet Take 20 mg by mouth daily.  . megestrol (MEGACE) 20 MG tablet Take 20 mg by mouth daily.  . metoprolol succinate (TOPROL-XL) 100 MG 24 hr tablet Take 50 mg by mouth daily. Take with or immediately following a meal.  . nitroGLYCERIN (NITROSTAT) 0.4 MG SL tablet Place 1 tablet (0.4 mg total) under the tongue every 5 (five) minutes as needed for chest pain (up to 3 doses).  . simvastatin (ZOCOR) 10 MG tablet Take 10 mg by mouth daily.  Marland Kitchen spironolactone (ALDACTONE) 25 MG tablet   . tamsulosin (FLOMAX) 0.4 MG CAPS capsule Take 0.4 mg by mouth daily.  Marland Kitchen warfarin (COUMADIN) 5 MG tablet Take 1 tablet (5 mg total) by mouth daily at 6 PM. Or as directed. Brand name please.  . bimatoprost (LUMIGAN) 0.03 % ophthalmic solution Place 1 drop into both eyes at bedtime.  . [DISCONTINUED] enoxaparin (LOVENOX) 80 MG/0.8ML injection     Review of Systems  Constitutional: Negative.   HENT: Negative.   Eyes: Negative.   Respiratory: Negative.   Cardiovascular: Negative.   Gastrointestinal: Negative.   Endocrine: Negative.   Genitourinary: Negative.   Musculoskeletal: Negative.   Skin: Negative.   Allergic/Immunologic: Negative.   Neurological: Negative.   Hematological: Negative.   Psychiatric/Behavioral: Negative.        Objective:   Physical Exam  Nursing note and vitals reviewed. Constitutional: He is oriented to person, place, and time. He appears well-developed and well-nourished. No distress.  Elderly,  but pleasant and cooperative for his age also notably kyphotic.  HENT:  Head: Normocephalic and atraumatic.  Right Ear: External ear normal.  Nose: Nose normal.  Mouth/Throat: Oropharynx is clear and moist. No oropharyngeal exudate.  Left ear cerumen was partially removed with a curette  Eyes: Conjunctivae and EOM are normal. Pupils are equal, round, and reactive to light. Right eye exhibits no discharge. Left eye exhibits no discharge. No scleral icterus.    Neck: Normal range of motion. Neck supple. No thyromegaly present.  Cardiovascular: Normal rate, regular rhythm, normal heart sounds and intact distal pulses.  Exam reveals no gallop and no friction rub.   No murmur heard. The rhythm was regular at 72 per min  Pulmonary/Chest: Effort normal and breath sounds normal. No respiratory distress. He has no wheezes. He has no rales. He exhibits no tenderness.  Abdominal: Soft. Bowel sounds are normal. He exhibits no mass. There is no tenderness. There is no rebound and no guarding.  Obesity  Genitourinary: Rectum normal and penis normal.  The prostate was slightly enlarged. There were no rectal masses. There was no inguinal hernia palpated. No inguinal nodes. The external genitalia were normal  Musculoskeletal: Normal range of motion. He exhibits edema (edema left foot greater than right). He exhibits no tenderness.  Kyphotic  Lymphadenopathy:    He has no cervical adenopathy.  Neurological: He is alert and oriented to person, place, and time. He has normal reflexes. No cranial nerve deficit.  Skin: Skin is warm and dry. No rash noted.  Psychiatric: He has a normal mood and affect. His behavior is normal. Judgment and thought content normal.   BP 176/105  Pulse 88  Temp(Src) 97.7 F (36.5 C) (Oral)  Ht 5' 7"  (1.702 m)  Wt 157 lb (71.215 kg)  BMI 24.58 kg/m2        Assessment & Plan:  1. Benign localized hyperplasia of prostate with urinary obstruction - POCT CBC  2. CAD- non obstructive disease by cath 3/14 - POCT CBC  3. HTN (hypertension) - POCT CBC - BMP8+EGFR - Hepatic function panel  4. Hyperlipidemia - POCT CBC - NMR, lipoprofile  5. Vitamin D deficiency - Vit D  25 hydroxy (rtn osteoporosis monitoring)  6. Bladder cancer  7. DVT of lower extremity, bilateral -Continue Coumadin  8. Acute pulmonary embolism  9. BPH (benign prostatic hyperplasia)  10. High risk medication use Patient Instructions  Continue  current medications. Continue good therapeutic lifestyle changes which include good diet and exercise. Fall precautions discussed with patient. Schedule your flu vaccine if you haven't had it yet If you are over 37 years old - you may need Prevnar 75 or the adult Pneumonia vaccine. BE Sure and take your blood pressure medicine regularly Please dop some blood pressure readings by the office in about a week for [place] to review Continue to keep followup visits with the neurologist and the cardiologist Continue to get your protimes regularly Return the FOBT   You can purchase debrox over-the-counter for use for ear wax The Prevnar vaccine may make your arm is sore  Arrie Senate MD

## 2013-05-17 NOTE — Addendum Note (Signed)
Addended by: Zannie Cove on: 05/17/2013 10:48 AM   Modules accepted: Orders

## 2013-05-18 ENCOUNTER — Other Ambulatory Visit: Payer: Self-pay | Admitting: *Deleted

## 2013-05-18 LAB — HEPATIC FUNCTION PANEL
ALT: 9 IU/L (ref 0–44)
AST: 18 IU/L (ref 0–40)
Albumin: 4.1 g/dL (ref 3.5–4.7)
Alkaline Phosphatase: 64 IU/L (ref 39–117)
Bilirubin, Direct: 0.25 mg/dL (ref 0.00–0.40)
Total Bilirubin: 0.7 mg/dL (ref 0.0–1.2)
Total Protein: 6.7 g/dL (ref 6.0–8.5)

## 2013-05-18 LAB — BMP8+EGFR
BUN/Creatinine Ratio: 19 (ref 10–22)
BUN: 20 mg/dL (ref 8–27)
CO2: 23 mmol/L (ref 18–29)
Calcium: 10.1 mg/dL (ref 8.6–10.2)
Chloride: 103 mmol/L (ref 97–108)
Creatinine, Ser: 1.05 mg/dL (ref 0.76–1.27)
GFR calc Af Amer: 72 mL/min/{1.73_m2} (ref >59)
GFR calc non Af Amer: 63 mL/min/{1.73_m2} (ref >59)
Glucose: 78 mg/dL (ref 65–99)
Potassium: 4.1 mmol/L (ref 3.5–5.2)
Sodium: 145 mmol/L — ABNORMAL HIGH (ref 134–144)

## 2013-05-18 LAB — NMR, LIPOPROFILE
Cholesterol: 107 mg/dL (ref <200)
HDL Cholesterol by NMR: 50 mg/dL (ref >=40)
HDL Particle Number: 34.4 umol/L (ref >=30.5)
LDL Particle Number: 794 nmol/L (ref <1000)
LDL Size: 19.9 nm — ABNORMAL LOW (ref >20.5)
LDLC SERPL CALC-MCNC: 42 mg/dL (ref <100)
LP-IR Score: 72 — ABNORMAL HIGH (ref <=45)
Small LDL Particle Number: 527 nmol/L (ref <=527)
Triglycerides by NMR: 77 mg/dL (ref <150)

## 2013-05-18 LAB — VITAMIN D 25 HYDROXY (VIT D DEFICIENCY, FRACTURES): Vit D, 25-Hydroxy: 26.9 ng/mL — ABNORMAL LOW (ref 30.0–100.0)

## 2013-05-18 MED ORDER — DONEPEZIL HCL 10 MG PO TABS: 10.0000 mg | ORAL_TABLET | Freq: Every day | ORAL | Status: DC

## 2013-05-18 MED ORDER — METOPROLOL SUCCINATE ER 100 MG PO TB24: 50.0000 mg | ORAL_TABLET | Freq: Every day | ORAL | Status: DC

## 2013-05-18 MED ORDER — GABAPENTIN 100 MG PO CAPS: 200.0000 mg | ORAL_CAPSULE | Freq: Every day | ORAL | Status: DC

## 2013-05-18 MED ORDER — TAMSULOSIN HCL 0.4 MG PO CAPS: 0.4000 mg | ORAL_CAPSULE | Freq: Every day | ORAL | Status: DC

## 2013-05-18 MED ORDER — SPIRONOLACTONE 25 MG PO TABS: 25.0000 mg | ORAL_TABLET | Freq: Every day | ORAL | Status: DC

## 2013-05-18 MED ORDER — LISINOPRIL 20 MG PO TABS: 20.0000 mg | ORAL_TABLET | Freq: Every day | ORAL | Status: DC

## 2013-05-18 MED ORDER — BIMATOPROST 0.03 % OP SOLN: 1.0000 [drp] | Freq: Every day | OPHTHALMIC | Status: DC

## 2013-05-18 MED ORDER — SIMVASTATIN 10 MG PO TABS
10.0000 mg | ORAL_TABLET | Freq: Every day | ORAL | Status: DC
Start: 2013-05-18 — End: 2013-07-05

## 2013-05-18 MED ORDER — MEGESTROL ACETATE 20 MG PO TABS: 20.0000 mg | ORAL_TABLET | Freq: Every day | ORAL | Status: DC

## 2013-05-23 ENCOUNTER — Other Ambulatory Visit: Payer: Self-pay | Admitting: *Deleted

## 2013-05-23 ENCOUNTER — Telehealth: Payer: Self-pay | Admitting: Pharmacist

## 2013-05-23 NOTE — Telephone Encounter (Signed)
[facility] is now packaging patient's medications and they need to clarify rx for metoprolol (medication profile from 05/17/13 states 50mg  daily but [facility] 100mg  daily) and also for aricept (medication profile from 05/17/13 states 5mg  but [facility]18[facility]13 [facility] 10mg  daily.) Reviewed patient's chart with Dr Tawanna Sat nurse and determined that should be metoprolol XL 50mg  daily and Aricept 5 mg daily.  Proper doses of medications noted in chart to avoid further confusion.

## 2013-05-24 ENCOUNTER — Ambulatory Visit (INDEPENDENT_AMBULATORY_CARE_PROVIDER_SITE_OTHER): Payer: Medicare Other | Admitting: Pharmacist

## 2013-05-24 DIAGNOSIS — I82409 Acute embolism and thrombosis of unspecified deep veins of unspecified lower extremity: Secondary | ICD-10-CM

## 2013-05-24 DIAGNOSIS — I2699 Other pulmonary embolism without acute cor pulmonale: Secondary | ICD-10-CM

## 2013-05-24 DIAGNOSIS — I82403 Acute embolism and thrombosis of unspecified deep veins of lower extremity, bilateral: Secondary | ICD-10-CM

## 2013-05-24 LAB — POCT INR: INR: 2

## 2013-05-24 MED ORDER — WARFARIN SODIUM 2.5 MG PO TABS: 2.5000 mg | ORAL_TABLET | Freq: Every day | ORAL | Status: DC

## 2013-05-24 NOTE — Patient Instructions (Signed)
Anticoagulation Dose Instructions as of 05/24/2013     Scott [name] Tue Wed Thu Fri Sat   New Dose 2.5 mg 2.5 mg 2.5 mg 2.5 mg Hold 2.5 mg 2.5 mg    Description       Change to no warfarin on thursdays and 2.5mg  all other days.      INR was 2.0 today

## 2013-05-28 ENCOUNTER — Ambulatory Visit: Payer: Medicare Other | Admitting: Family Medicine

## 2013-05-31 ENCOUNTER — Encounter: Payer: Self-pay | Admitting: Pharmacist

## 2013-05-31 ENCOUNTER — Ambulatory Visit (INDEPENDENT_AMBULATORY_CARE_PROVIDER_SITE_OTHER): Payer: Medicare Other | Admitting: Family Medicine

## 2013-05-31 ENCOUNTER — Encounter: Payer: Self-pay | Admitting: Family Medicine

## 2013-05-31 VITALS — BP 180/92 | HR 71 | Temp 96.6°F | Ht 67.0 in | Wt 156.0 lb

## 2013-05-31 DIAGNOSIS — M25559 Pain in unspecified hip: Secondary | ICD-10-CM

## 2013-05-31 DIAGNOSIS — I82409 Acute embolism and thrombosis of unspecified deep veins of unspecified lower extremity: Secondary | ICD-10-CM

## 2013-05-31 DIAGNOSIS — H6120 Impacted cerumen, unspecified ear: Secondary | ICD-10-CM

## 2013-05-31 DIAGNOSIS — M25551 Pain in right hip: Secondary | ICD-10-CM

## 2013-05-31 DIAGNOSIS — I2510 Atherosclerotic heart disease of native coronary artery without angina pectoris: Secondary | ICD-10-CM | POA: Diagnosis not present

## 2013-05-31 DIAGNOSIS — G609 Hereditary and idiopathic neuropathy, unspecified: Secondary | ICD-10-CM | POA: Diagnosis not present

## 2013-05-31 DIAGNOSIS — I2699 Other pulmonary embolism without acute cor pulmonale: Secondary | ICD-10-CM

## 2013-05-31 DIAGNOSIS — E785 Hyperlipidemia, unspecified: Secondary | ICD-10-CM

## 2013-05-31 DIAGNOSIS — H6123 Impacted cerumen, bilateral: Secondary | ICD-10-CM

## 2013-05-31 DIAGNOSIS — G629 Polyneuropathy, unspecified: Secondary | ICD-10-CM

## 2013-05-31 DIAGNOSIS — I82403 Acute embolism and thrombosis of unspecified deep veins of lower extremity, bilateral: Secondary | ICD-10-CM

## 2013-05-31 DIAGNOSIS — I10 Essential (primary) hypertension: Secondary | ICD-10-CM

## 2013-05-31 LAB — POCT INR: INR: 1.7

## 2013-05-31 NOTE — Patient Instructions (Addendum)
Anticoagulation Dose Instructions as of 05/31/2013     Dorene Grebe Tue Wed Thu Fri Sat   New Dose 2.5 mg 2.5 mg 2.5 mg 2.5 mg 2.5 mg 2.5 mg 2.5 mg    Description       Increase warfarin to 2.5 mg daily (= 1/2 tablet of 5mg  or 1 tablet 2.5mg )      INR was 1.7 today (a little low)  [facility] and the medical providers at [facility] strive to bring you the best medical care.  In doing so we not only want to address your current medical conditions and concerns but also to detect new conditions early and prevent illness, disease and health-related problems.    Medicare offers a yearly Wellness Visit which allows our clinical staff to assess your need for preventative services including immunizations, lifestyle education, counseling to decrease risk of preventable diseases and screening for fall risk and other medical concerns.    This visit is provided free of charge (no copay) for all Medicare recipients. The clinical pharmacists at [facility] have begun to conduct these Wellness Visits which will also include a thorough review of all your medications.    As you primary medical provider recommend that you make an appointment for your Annual Wellness Visit if you have not done so already this year.  You may set up this appointment before you leave today or you may call back [phone]) and schedule an appointment.  Please make sure when you call that you mention that you are scheduling your Annual Wellness Visit with the clinical pharmacist so that the appointment may be made for the proper length of time.   We will schedule a visit for you to see the orthopedist regarding the pain in your hip and the numbness in your feet. This will be Dr. Nelva Bush  Continue to monitor your blood pressures at home Watch his sodium intake Please be careful and not put herself at risk for falling Continue current Try some cortisone 10 over-the-counter to your scalp ,  lightly twice daily for 7-10 day   You can purchase debrox over-the-counter and use this as needed to soften ear wax once a month

## 2013-05-31 NOTE — Progress Notes (Signed)
Subjective:    Patient ID: Scott [name], male    DOB: 1922/02/13, 80 y.o.   MRN: [id number]  HPI Patient here today for recheck of htn and to recheck ears. Patient did not pick up the Debrox drops. Patient complains of arthralgias in his right hip. He did not pick up the medicine for dissolving ear cerumen. As of note his blood pressure is very elevated here today but patient says that his blood pressures at home run in the 130s over the 70s. Patient also complains of neuropathy in both feet. This is described as numbness.      Patient Active Problem List   Diagnosis Date Noted  . High risk medication use 05/17/2013  . BPH (benign prostatic hyperplasia) 05/17/2013  . Vitamin D deficiency 05/17/2013  . Chronic anticoagulation- subtheraputic INR on admission 04/19/2013  . Acute pulmonary embolism 04/17/2013  . NSVT (nonsustained ventricular tachycardia)- not an ICD candidate 04/17/2013  . NICM (nonischemic cardiomyopathy)- EF 20-25% by Echo 04/12/13 04/17/2013  . Elevated troponin 04/10/2013  . Acute on chronic systolic CHF (congestive heart failure) 03/15/2013  . DVT of lower extremity, bilateral 03/15/2013  . Fatigue 11/02/2012  . Constipation 11/02/2012  . Alcohol abuse 08/27/2012  . Syncope 06/21/2012  . Bladder cancer 03/15/2012  . Benign localized hyperplasia of prostate with urinary obstruction 03/15/2012  . HTN (hypertension)   . Hyperlipidemia   . PVCs (premature ventricular contractions)   . CAD- non obstructive disease by cath 3/14    Outpatient Encounter Prescriptions as of 05/31/2013  Medication Sig  . bimatoprost (LUMIGAN) 0.03 % ophthalmic solution Place 1 drop into both eyes at bedtime.  . ciclopirox (PENLAC) 8 % solution   . donepezil (ARICEPT) 10 MG tablet Take 5 mg by mouth at bedtime.  . furosemide (LASIX) 20 MG tablet Take 1 tablet (20 mg total) by mouth daily.  Marland Kitchen gabapentin (NEURONTIN) 100 MG capsule Take 2 capsules (200 mg total) by mouth at bedtime.  Marland Kitchen  HYDROcodone-acetaminophen (NORCO) 10-325 MG per tablet Take 1 tablet by mouth every 6 (six) hours as needed for moderate pain.   Marland Kitchen lisinopril (PRINIVIL,ZESTRIL) 20 MG tablet Take 1 tablet (20 mg total) by mouth daily.  . megestrol (MEGACE) 20 MG tablet Take 1 tablet (20 mg total) by mouth daily.  . metoprolol succinate (TOPROL-XL) 100 MG 24 hr tablet Take 1 tablet (100 mg total) by mouth daily. Take with or immediately following a meal.  . nitroGLYCERIN (NITROSTAT) 0.4 MG SL tablet Place 1 tablet (0.4 mg total) under the tongue every 5 (five) minutes as needed for chest pain (up to 3 doses).  . simvastatin (ZOCOR) 10 MG tablet Take 1 tablet (10 mg total) by mouth daily.  Marland Kitchen spironolactone (ALDACTONE) 25 MG tablet Take 1 tablet (25 mg total) by mouth daily.  . tamsulosin (FLOMAX) 0.4 MG CAPS capsule Take 1 capsule (0.4 mg total) by mouth daily.  Marland Kitchen warfarin (COUMADIN) 2.5 MG tablet Take 1 tablet (2.5 mg total) by mouth daily.    Review of Systems  Constitutional: Negative.   HENT: Negative.   Eyes: Negative.   Respiratory: Negative.   Cardiovascular: Negative.   Gastrointestinal: Negative.   Endocrine: Negative.   Genitourinary: Negative.   Musculoskeletal: Positive for arthralgias (right side hip pain).  Skin: Negative.   Allergic/Immunologic: Negative.   Neurological: Negative.  Negative for dizziness and headaches.  Hematological: Negative.   Psychiatric/Behavioral: Negative.        Objective:   Physical Exam  Nursing  note and vitals reviewed. Constitutional: He is oriented to person, place, and time. He appears well-developed and well-nourished. No distress.  HENT:  Head: Normocephalic and atraumatic.  Nose: Nose normal.  Mouth/Throat: Oropharynx is clear and moist. No oropharyngeal exudate.  Bilateral ear cerumen was removed with curette  Eyes: Conjunctivae and EOM are normal. Pupils are equal, round, and reactive to light. Right eye exhibits no discharge. Left eye exhibits no  discharge. No scleral icterus.  Neck: Normal range of motion. Neck supple. No thyromegaly present.  Cardiovascular: Normal rate, regular rhythm and normal heart sounds.  Exam reveals no gallop and no friction rub.   No murmur heard. The rhythm is regular at 72 per minute, pedal pulses were diminished right greater than left. Inguinal pulses were diminished right greater than left  Pulmonary/Chest: Effort normal and breath sounds normal. No respiratory distress. He has no wheezes. He has no rales. He exhibits no tenderness.  Abdominal: Soft. Bowel sounds are normal. He exhibits no mass. There is no tenderness. There is no rebound and no guarding.  Obesity  Musculoskeletal: Normal range of motion. He exhibits no edema and no tenderness.  Lymphadenopathy:    He has no cervical adenopathy.  Neurological: He is alert and oriented to person, place, and time.  Skin: Skin is warm and dry. Rash noted. No erythema. No pallor.  Excoriated area on right parietal scalp, no erythema just thickened tissue that patient says itches and he has to scratch a lot.  Psychiatric: He has a normal mood and affect. His behavior is normal. Judgment and thought content normal.   BP 180/92  Pulse 71  Temp(Src) 96.6 F (35.9 C) (Oral)  Ht 5\' 7"  (1.702 m)  Wt 156 lb (70.761 kg)  BMI 24.43 kg/m2  Repeat blood pressure was 120/68 in his right arm sitting.      Assessment & Plan:  1. DVT of lower extremity, bilateral - POCT INR  2. Acute pulmonary embolism, history of  3. Peripheral neuropathy  4. Bilateral impacted cerumen -This was successfully removed without complication  5. Hyperlipidemia - continue current medication  6. HTN (hypertension) -Continue current medication and monitor her blood pressures regularly at home  Patient Instructions   Anticoagulation Dose Instructions as of 05/31/2013     Dorene Grebe Tue Wed Thu Fri Sat   New Dose 2.5 mg 2.5 mg 2.5 mg 2.5 mg 2.5 mg 2.5 mg 2.5 mg      Description       Increase warfarin to 2.5 mg daily (= 1/2 tablet of 5mg  or 1 tablet 2.5mg )      INR was 1.7 today (a little low)  [facility] and the medical providers at [facility] strive to bring you the best medical care.  In doing so we not only want to address your current medical conditions and concerns but also to detect new conditions early and prevent illness, disease and health-related problems.    Medicare offers a yearly Wellness Visit which allows our clinical staff to assess your need for preventative services including immunizations, lifestyle education, counseling to decrease risk of preventable diseases and screening for fall risk and other medical concerns.    This visit is provided free of charge (no copay) for all Medicare recipients. The clinical pharmacists at [facility] have begun to conduct these Wellness Visits which will also include a thorough review of all your medications.    As you primary medical provider recommend that you make  an appointment for your Annual Wellness Visit if you have not done so already this year.  You may set up this appointment before you leave today or you may call back [phone] and schedule an appointment.  Please make sure when you call that you mention that you are scheduling your Annual Wellness Visit with the clinical pharmacist so that the appointment may be made for the proper length of time.   We will schedule a visit for you to see the orthopedist regarding the pain in your hip and the numbness in your feet. This will be Dr. Nelva Bush  Continue to monitor your blood pressures at home Watch his sodium intake Please be careful and not put herself at risk for falling Continue current Try some cortisone 10 over-the-counter to your scalp , lightly twice daily for 7-10 day   You can purchase debrox over-the-counter and use this as needed to soften ear wax once a month      Arrie Senate MD

## 2013-05-31 NOTE — Addendum Note (Signed)
Addended by: Zannie Cove on: 05/31/2013 03:15 PM   Modules accepted: Orders

## 2013-06-02 ENCOUNTER — Other Ambulatory Visit: Payer: Self-pay | Admitting: Family Medicine

## 2013-06-04 NOTE — Telephone Encounter (Signed)
Do not see this med on patient list. Please advise

## 2013-06-11 ENCOUNTER — Ambulatory Visit (INDEPENDENT_AMBULATORY_CARE_PROVIDER_SITE_OTHER): Payer: Medicare Other | Admitting: Pharmacist

## 2013-06-11 DIAGNOSIS — I2699 Other pulmonary embolism without acute cor pulmonale: Secondary | ICD-10-CM | POA: Diagnosis not present

## 2013-06-11 DIAGNOSIS — I82403 Acute embolism and thrombosis of unspecified deep veins of lower extremity, bilateral: Secondary | ICD-10-CM

## 2013-06-11 DIAGNOSIS — I82409 Acute embolism and thrombosis of unspecified deep veins of unspecified lower extremity: Secondary | ICD-10-CM

## 2013-06-11 LAB — POCT INR: INR: 1.6

## 2013-06-22 ENCOUNTER — Ambulatory Visit (INDEPENDENT_AMBULATORY_CARE_PROVIDER_SITE_OTHER): Payer: Medicare Other | Admitting: Urology

## 2013-06-22 DIAGNOSIS — Z8551 Personal history of malignant neoplasm of bladder: Secondary | ICD-10-CM | POA: Diagnosis not present

## 2013-06-22 DIAGNOSIS — N138 Other obstructive and reflux uropathy: Secondary | ICD-10-CM | POA: Diagnosis not present

## 2013-06-22 DIAGNOSIS — N401 Enlarged prostate with lower urinary tract symptoms: Secondary | ICD-10-CM | POA: Diagnosis not present

## 2013-06-22 DIAGNOSIS — R350 Frequency of micturition: Secondary | ICD-10-CM | POA: Diagnosis not present

## 2013-06-27 ENCOUNTER — Encounter: Payer: Self-pay | Admitting: Cardiology

## 2013-06-27 ENCOUNTER — Ambulatory Visit (INDEPENDENT_AMBULATORY_CARE_PROVIDER_SITE_OTHER): Payer: Medicare Other | Admitting: Pharmacist

## 2013-06-27 ENCOUNTER — Ambulatory Visit (INDEPENDENT_AMBULATORY_CARE_PROVIDER_SITE_OTHER): Payer: Medicare Other | Admitting: Cardiology

## 2013-06-27 VITALS — BP 157/91 | HR 73 | Ht 67.0 in | Wt 151.0 lb

## 2013-06-27 DIAGNOSIS — I2510 Atherosclerotic heart disease of native coronary artery without angina pectoris: Secondary | ICD-10-CM

## 2013-06-27 DIAGNOSIS — I4949 Other premature depolarization: Secondary | ICD-10-CM

## 2013-06-27 DIAGNOSIS — I2699 Other pulmonary embolism without acute cor pulmonale: Secondary | ICD-10-CM | POA: Diagnosis not present

## 2013-06-27 DIAGNOSIS — I82403 Acute embolism and thrombosis of unspecified deep veins of lower extremity, bilateral: Secondary | ICD-10-CM

## 2013-06-27 DIAGNOSIS — I493 Ventricular premature depolarization: Secondary | ICD-10-CM

## 2013-06-27 DIAGNOSIS — I428 Other cardiomyopathies: Secondary | ICD-10-CM | POA: Diagnosis not present

## 2013-06-27 DIAGNOSIS — I82409 Acute embolism and thrombosis of unspecified deep veins of unspecified lower extremity: Secondary | ICD-10-CM | POA: Diagnosis not present

## 2013-06-27 LAB — POCT INR: INR: 3

## 2013-06-27 NOTE — Patient Instructions (Signed)
Anticoagulation Dose Instructions as of 06/27/2013     Scott [name] Tue Wed Thu Fri Sat   New Dose 2.5 mg 2.5 mg 5 mg 2.5 mg 2.5 mg 2.5 mg 2.5 mg    Description       Decrease warfarin to 5mg  on tuesdays  and 2.5 mg all other days.      INR was 3.0 today

## 2013-06-27 NOTE — Progress Notes (Signed)
HPI Scott [name] is a 80 y.o.male with NICM (06/2012 cath with patent cors - EF 40% then, more recently 20-25%), HTN, chronic LBBB, postural syncope related to diuretics, EtOH abuse, and recent DVT who was admitted with CP and elevated troponin. He was seen by EP in March of this year after being admitted with syncope with episodes of asymptomatic NSVT on telemetry. Echo at that time showed LVEF 40% with multiple WMAs, subsequent cath showed patent coronaries. Syncope was thought to be orthostatic and likely related to several medications he was on (HCTZ, tamsulosin, norvasc, metoprolol). He was recently admitted 03/2013 and found to have acute bilateral DVT, thus was placed on Coumadin. His further depressed EF was felt related to ongoing alcohol use.  He was most recently admitted with a slightly elevated troponin and was found to have small pulmonary emboli.  Of note he had some decompensated heart failure on that admission. His INR was subtherapeutic on admission which may have been the reason for her pulmonary emboli.    Since I last saw him he has had no acute complaints. He's not having any of the increased shortness of breath he was having. His son wonders if he's still drinking but the patient does know. He does have some decreased mentation at night with some anger issues. He is fatigued with decreased stamina but he is still doing a little bit of walking. He's not having any acute palpitations, presyncope or syncope. He's not describing any chest pressure, neck or arm discomfort. He is doing a little physical therapy.  Allergies  Allergen Reactions  . Lipitor [Atorvastatin] Other (See Comments)    unknown    Current Outpatient Prescriptions  Medication Sig Dispense Refill  . amLODipine (NORVASC) 5 MG tablet TAKE 1 TABLET (5 MG TOTAL) BY MOUTH DAILY.  30 tablet  0  . bimatoprost (LUMIGAN) 0.03 % ophthalmic solution Place 1 drop into both eyes at bedtime.  2.5 mL  2  . ciclopirox (PENLAC) 8  % solution       . donepezil (ARICEPT) 10 MG tablet Take 5 mg by mouth at bedtime.      . furosemide (LASIX) 20 MG tablet Take 1 tablet (20 mg total) by mouth daily.  30 tablet  6  . gabapentin (NEURONTIN) 100 MG capsule Take 2 capsules (200 mg total) by mouth at bedtime.  60 capsule  2  . HYDROcodone-acetaminophen (NORCO) 10-325 MG per tablet Take 1 tablet by mouth every 6 (six) hours as needed for moderate pain.       Marland Kitchen lisinopril (PRINIVIL,ZESTRIL) 20 MG tablet Take 1 tablet (20 mg total) by mouth daily.  30 tablet  2  . megestrol (MEGACE) 20 MG tablet Take 1 tablet (20 mg total) by mouth daily.  30 tablet  2  . metoprolol succinate (TOPROL-XL) 100 MG 24 hr tablet Take 1 tablet (100 mg total) by mouth daily. Take with or immediately following a meal.  30 tablet  2  . nitroGLYCERIN (NITROSTAT) 0.4 MG SL tablet Place 1 tablet (0.4 mg total) under the tongue every 5 (five) minutes as needed for chest pain (up to 3 doses).  25 tablet  3  . simvastatin (ZOCOR) 10 MG tablet Take 1 tablet (10 mg total) by mouth daily.  30 tablet  2  . spironolactone (ALDACTONE) 25 MG tablet Take 1 tablet (25 mg total) by mouth daily.  30 tablet  2  . tamsulosin (FLOMAX) 0.4 MG CAPS capsule Take 1 capsule (0.4  mg total) by mouth daily.  30 capsule  2  . warfarin (COUMADIN) 2.5 MG tablet Take 1 tablet (2.5 mg total) by mouth daily.  30 tablet  2   No current facility-administered medications for this visit.    Past Medical History  Diagnosis Date  . HTN (hypertension)   . Hyperlipidemia   . PVCs (premature ventricular contractions)   . Glaucoma   . PVD (peripheral vascular disease)     Right ABI .75, Left .78 (2006)  . Popliteal artery aneurysm, bilateral DECUMENTED CHRONIC PARTIAL OCCLUSION--  PT DENIES CLAUDICATION OR ANY OTHER SYMPTOMS  . Frequency of urination   . Nocturia   . Bladder cancer 03/15/2012  . Chronic systolic CHF (congestive heart failure)     a. NICM - patent cors 06/2012, EF 40% at that time.  b. 03/2013 eval: EF 20-25%.  . DDD (degenerative disc disease)   . Arthritis   . DVT, bilateral lower limbs 03/2013    a. Bilat DVT dx 03/2013.  . Pulmonary embolism     a. By CT angio 04/2013.  Marland Kitchen Elevated troponin     a. Adm 12/30-04/2013 - not felt to represent ACS; ?due to PE. b. Patent cors 06/2012.  Marland Kitchen NSVT (nonsustained ventricular tachycardia)     a. NSVT 06/2012; NSVT also seen during 03/2013 adm. b. Med rx. Not candidate for ICD given adv age.  Marland Kitchen ETOH abuse   . Syncope     a. Felt to be postural syncope related to diuretics 06/2012.    ROS:  As stated in the HPI and negative for all other systems.   PHYSICAL EXAM BP 157/91  Pulse 73  Ht 5\' 7"  (1.702 m)  Wt 151 lb (68.493 kg)  BMI 23.64 kg/m2 GENERAL:  Frail appearing NECK:  No jugular venous distention, waveform within normal limits, carotid upstroke brisk and symmetric, no bruits, no thyromegaly LUNGS:  Clear to auscultation bilaterally BACK:  No CVA tenderness CHEST:  Unremarkable HEART:  PMI not displaced or sustained,S1 and S2 within normal limits, no S3, no S4, no clicks, no rubs, no murmurs ABD:  Flat, positive bowel sounds normal in frequency in pitch, no bruits, no rebound, no guarding, no midline pulsatile mass, no hepatomegaly, no splenomegaly EXT:  2 plus pulses upper and femorals.  Decreased DP/PT bilateral, no edema, no cyanosis no clubbing  EKG:  Sinus rhythm, rate 73, left bundle branch block, left axis deviation, premature ventricular contractions. 06/27/2013  ASSESSMENT AND PLAN  Diastolic heart failure - He seems to be euvolemic.  At this point, no change in therapy is indicated.   No further cardiovascular testing is indicated.  PVCs (premature ventricular contractions) -  The patient does have frequent PVCs. He has nonsustained ventricular tachycardia.  However, he has not had symptoms with this and he would not want to consider an ICD.   HTN (hypertension) -  His blood pressure might be slightly  elevated recently .  However, it is still OK for his age and I would not suggest increasing the meds.   DVT/PULMONARY EMBOLI-  INR is followed closely by Dr. Laurance Flatten. He has been subtherapeutic he is having this checked today.  INR  Date Value Ref Range Status  06/11/2013 1.6   Corrected     qc ok  05/31/2013 1.7   Final     qc ok  05/24/2013 2.0   Final     qc ok  05/17/2013 2.8   Final     qc  ok

## 2013-06-27 NOTE — Patient Instructions (Signed)
The current medical regimen is effective;  continue present plan and medications.  Follow up in 6 months with Dr Hochrein.  You will receive a letter in the mail 2 months before you are due.  Please call us when you receive this letter to schedule your follow up appointment.  

## 2013-06-29 DIAGNOSIS — Z79899 Other long term (current) drug therapy: Secondary | ICD-10-CM | POA: Diagnosis not present

## 2013-06-29 DIAGNOSIS — G894 Chronic pain syndrome: Secondary | ICD-10-CM | POA: Diagnosis not present

## 2013-06-29 DIAGNOSIS — M5020 Other cervical disc displacement, unspecified cervical region: Secondary | ICD-10-CM | POA: Diagnosis not present

## 2013-07-05 ENCOUNTER — Other Ambulatory Visit: Payer: Self-pay | Admitting: *Deleted

## 2013-07-05 MED ORDER — SPIRONOLACTONE 25 MG PO TABS: 25.0000 mg | ORAL_TABLET | Freq: Every day | ORAL | Status: DC

## 2013-07-05 MED ORDER — SIMVASTATIN 10 MG PO TABS: 10.0000 mg | ORAL_TABLET | Freq: Every day | ORAL | Status: DC

## 2013-07-10 ENCOUNTER — Emergency Department (HOSPITAL_COMMUNITY): Payer: Medicare Other

## 2013-07-10 ENCOUNTER — Encounter (HOSPITAL_COMMUNITY): Payer: Self-pay | Admitting: Emergency Medicine

## 2013-07-10 ENCOUNTER — Emergency Department (HOSPITAL_COMMUNITY)
Admission: EM | Admit: 2013-07-10 | Discharge: 2013-07-11 | Disposition: A | Discharge status: Home / Self Care | Payer: Medicare Other | Attending: Emergency Medicine | Admitting: Emergency Medicine

## 2013-07-10 DIAGNOSIS — I10 Essential (primary) hypertension: Secondary | ICD-10-CM | POA: Insufficient documentation

## 2013-07-10 DIAGNOSIS — M129 Arthropathy, unspecified: Secondary | ICD-10-CM | POA: Insufficient documentation

## 2013-07-10 DIAGNOSIS — Z86718 Personal history of other venous thrombosis and embolism: Secondary | ICD-10-CM | POA: Diagnosis not present

## 2013-07-10 DIAGNOSIS — Z8551 Personal history of malignant neoplasm of bladder: Secondary | ICD-10-CM | POA: Insufficient documentation

## 2013-07-10 DIAGNOSIS — I4949 Other premature depolarization: Secondary | ICD-10-CM | POA: Diagnosis not present

## 2013-07-10 DIAGNOSIS — I5022 Chronic systolic (congestive) heart failure: Secondary | ICD-10-CM | POA: Insufficient documentation

## 2013-07-10 DIAGNOSIS — Z9849 Cataract extraction status, unspecified eye: Secondary | ICD-10-CM | POA: Insufficient documentation

## 2013-07-10 DIAGNOSIS — E785 Hyperlipidemia, unspecified: Secondary | ICD-10-CM | POA: Insufficient documentation

## 2013-07-10 DIAGNOSIS — Z9889 Other specified postprocedural states: Secondary | ICD-10-CM | POA: Diagnosis not present

## 2013-07-10 DIAGNOSIS — Z79899 Other long term (current) drug therapy: Secondary | ICD-10-CM | POA: Insufficient documentation

## 2013-07-10 DIAGNOSIS — Z8669 Personal history of other diseases of the nervous system and sense organs: Secondary | ICD-10-CM | POA: Diagnosis not present

## 2013-07-10 DIAGNOSIS — Z87891 Personal history of nicotine dependence: Secondary | ICD-10-CM | POA: Diagnosis not present

## 2013-07-10 DIAGNOSIS — I70209 Unspecified atherosclerosis of native arteries of extremities, unspecified extremity: Secondary | ICD-10-CM | POA: Diagnosis not present

## 2013-07-10 DIAGNOSIS — Z7901 Long term (current) use of anticoagulants: Secondary | ICD-10-CM | POA: Insufficient documentation

## 2013-07-10 DIAGNOSIS — L851 Acquired keratosis [keratoderma] palmaris et plantaris: Secondary | ICD-10-CM | POA: Diagnosis not present

## 2013-07-10 DIAGNOSIS — F29 Unspecified psychosis not due to a substance or known physiological condition: Secondary | ICD-10-CM | POA: Diagnosis not present

## 2013-07-10 DIAGNOSIS — R55 Syncope and collapse: Secondary | ICD-10-CM | POA: Diagnosis not present

## 2013-07-10 DIAGNOSIS — Z86711 Personal history of pulmonary embolism: Secondary | ICD-10-CM | POA: Diagnosis not present

## 2013-07-10 DIAGNOSIS — IMO0002 Reserved for concepts with insufficient information to code with codable children: Secondary | ICD-10-CM | POA: Diagnosis not present

## 2013-07-10 DIAGNOSIS — R404 Transient alteration of awareness: Secondary | ICD-10-CM | POA: Diagnosis not present

## 2013-07-10 DIAGNOSIS — B351 Tinea unguium: Secondary | ICD-10-CM | POA: Diagnosis not present

## 2013-07-10 LAB — CBC WITH DIFFERENTIAL/PLATELET
Basophils Absolute: 0 10*3/uL (ref 0.0–0.1)
Basophils Relative: 0 % (ref 0–1)
Eosinophils Absolute: 0 10*3/uL (ref 0.0–0.7)
Eosinophils Relative: 0 % (ref 0–5)
HCT: 37.3 % — ABNORMAL LOW (ref 39.0–52.0)
Hemoglobin: 13.1 g/dL (ref 13.0–17.0)
Lymphocytes Relative: 36 % (ref 12–46)
Lymphs Abs: 0.9 10*3/uL (ref 0.7–4.0)
MCH: 28.5 pg (ref 26.0–34.0)
MCHC: 35.1 g/dL (ref 30.0–36.0)
MCV: 81.1 fL (ref 78.0–100.0)
Monocytes Absolute: 0.2 10*3/uL (ref 0.1–1.0)
Monocytes Relative: 7 % (ref 3–12)
Neutro Abs: 1.4 10*3/uL — ABNORMAL LOW (ref 1.7–7.7)
Neutrophils Relative %: 57 % (ref 43–77)
Platelets: 120 10*3/uL — ABNORMAL LOW (ref 150–400)
RBC: 4.6 MIL/uL (ref 4.22–5.81)
RDW: 17 % — ABNORMAL HIGH (ref 11.5–15.5)
WBC: 2.5 10*3/uL — ABNORMAL LOW (ref 4.0–10.5)

## 2013-07-10 LAB — URINE MICROSCOPIC-ADD ON

## 2013-07-10 LAB — URINALYSIS, ROUTINE W REFLEX MICROSCOPIC
Bilirubin Urine: NEGATIVE
Glucose, UA: NEGATIVE mg/dL
Ketones, ur: NEGATIVE mg/dL
Leukocytes, UA: NEGATIVE
Nitrite: NEGATIVE
Protein, ur: NEGATIVE mg/dL
Specific Gravity, Urine: 1.015 (ref 1.005–1.030)
Urobilinogen, UA: 0.2 mg/dL (ref 0.0–1.0)
pH: 5.5 (ref 5.0–8.0)

## 2013-07-10 LAB — BASIC METABOLIC PANEL
BUN: 30 mg/dL — ABNORMAL HIGH (ref 6–23)
CO2: 21 mEq/L (ref 19–32)
Calcium: 9.3 mg/dL (ref 8.4–10.5)
Chloride: 98 mEq/L (ref 96–112)
Creatinine, Ser: 1.39 mg/dL — ABNORMAL HIGH (ref 0.50–1.35)
GFR calc Af Amer: 50 mL/min — ABNORMAL LOW (ref >90)
GFR calc non Af Amer: 43 mL/min — ABNORMAL LOW (ref >90)
Glucose, Bld: 92 mg/dL (ref 70–99)
Potassium: 3.7 mEq/L (ref 3.7–5.3)
Sodium: 136 mEq/L — ABNORMAL LOW (ref 137–147)

## 2013-07-10 MED ORDER — SODIUM CHLORIDE 0.9 % IV BOLUS (SEPSIS)
500.0000 mL | Freq: Once | INTRAVENOUS | Status: AC
  Administered 2013-07-10: 22:00:00 via INTRAVENOUS

## 2013-07-10 NOTE — ED Notes (Signed)
Pt had 2 syncopal episodes at home approx 745pm tonight.  Pt states he is having pain in right hip from hitting the floor.

## 2013-07-10 NOTE — ED Provider Notes (Signed)
CSN: [id number]     Arrival date & time 07/10/13  2050 History  This chart was scribed for Nat Christen, MD by Rolanda Lundborg, ED Scribe. This patient was seen in room APA09/APA09 and the patient's care was started at 9:30 PM.    Chief Complaint  Patient presents with  . Loss of Consciousness     (Consider location/radiation/quality/duration/timing/severity/associated sxs/prior Treatment) The history is provided by a relative (son). No language interpreter was used.   HPI Comments: Scott [name] is a 80 y.o. male who presents to the Emergency Department complaining of a fall tonight due to suspected syncope at 7:45pm followed by confusion and unsteadiness lasting for five minutes. Fall was not witnessed. Son is present, states he heard a crash and found the pt lying on the bathroom floor. Son states he and the caregiver tried to stand the pt up but he was unable to stand on his own. Son states he returned to baseline within a few minutes and walked back to the bathroom but then became unsteady and confused again. Son states his eyes appeared unfocused. Pt states he feels normal now.   Past Medical History  Diagnosis Date  . HTN (hypertension)   . Hyperlipidemia   . PVCs (premature ventricular contractions)   . Glaucoma   . PVD (peripheral vascular disease)     Right ABI .75, Left .78 (2006)  . Popliteal artery aneurysm, bilateral DECUMENTED CHRONIC PARTIAL OCCLUSION--  PT DENIES CLAUDICATION OR ANY OTHER SYMPTOMS  . Frequency of urination   . Nocturia   . Bladder cancer 03/15/2012  . Chronic systolic CHF (congestive heart failure)     a. NICM - patent cors 06/2012, EF 40% at that time. b. 03/2013 eval: EF 20-25%.  . DDD (degenerative disc disease)   . Arthritis   . DVT, bilateral lower limbs 03/2013    a. Bilat DVT dx 03/2013.  . Pulmonary embolism     a. By CT angio 04/2013.  Marland Kitchen Elevated troponin     a. Adm 12/30-04/2013 - not felt to represent ACS; ?due to PE. b. Patent cors 06/2012.   Marland Kitchen NSVT (nonsustained ventricular tachycardia)     a. NSVT 06/2012; NSVT also seen during 03/2013 adm. b. Med rx. Not candidate for ICD given adv age.  Marland Kitchen ETOH abuse   . Syncope     a. Felt to be postural syncope related to diuretics 06/2012.   Past Surgical History  Procedure Laterality Date  . Transthoracic echocardiogram  09-22-2010    MODERATE LVH/ EF 51%/ GRADE I DIASTOLIC DYSFUNCTION/ AORTIC SCLEROSIS WITHOUT STENOSIS/  RV  SYSTOLIC  MILDLY REDUCED FUNCTION  . Cardiovascular stress test  10-15-2010    LOW RISK NUCLEAR STUDY/ NO EVIDENCE OF ISCHEMIA/ NORMAL EF  . Shoulder surgery  1970's    "don't remember which side or what kind of OR"  . Transurethral resection of bladder tumor  10/07/2011    Procedure: TRANSURETHRAL RESECTION OF BLADDER TUMOR (TURBT);  Surgeon: Malka So, MD;  Location: [facility];  Service: Urology;  Laterality: N/A;  . Cystoscopy  10/07/2011    Procedure: CYSTOSCOPY;  Surgeon: Malka So, MD;  Location: [facility];  Service: Urology;  Laterality: N/A;  . Cystoscopy with biopsy  03/14/2012    Procedure: CYSTOSCOPY WITH BIOPSY;  Surgeon: Malka So, MD;  Location: [facility];  Service: Urology;  Laterality: N/A;  WITH FULGURATION  . Transurethral resection of prostate  03/14/2012    Procedure:  TRANSURETHRAL RESECTION OF THE PROSTATE WITH GYRUS INSTRUMENTS;  Surgeon: Anner Crete, MD;  Location: [facility];  Service: Urology;  Laterality: N/A;  . Tonsillectomy    . Cataract extraction w/ intraocular lens  implant, bilateral Bilateral   . Cardiac catheterization  05-11-2004  [facility]    MINIMAL CORANARY PLAQUE/ NORMAL LVF/ EF 55%/  NON-OBSTRUCTIVE LAD 25%  . Cardiac catheterization  06/2012   Family History  Problem Relation Age of Onset  . Hypertension Mother   . Lung cancer Sister    History  Substance Use Topics  . Smoking status: Former Smoker -- 1.00 packs/day for 20 years    Types: Cigarettes    Start date: 04/12/1942     Quit date: 04/13/1975  . Smokeless tobacco: Never Used  . Alcohol Use: 0.0 oz/week     Comment: 04/10/2013 "trying to quit alcohol; don't know how much I drink; drink whenever I feel like it"    Review of Systems  Neurological: Positive for syncope.   A complete 10 system review of systems was obtained and all systems are negative except as noted in the HPI and PMH.     Allergies  Lipitor  Home Medications   Current Outpatient Rx  Name  Route  Sig  Dispense  Refill  . bimatoprost (LUMIGAN) 0.03 % ophthalmic solution   Both Eyes   Place 1 drop into both eyes at bedtime.   2.5 mL   2   . donepezil (ARICEPT) 10 MG tablet   Oral   Take 5 mg by mouth at bedtime.         . furosemide (LASIX) 20 MG tablet   Oral   Take 20 mg by mouth every morning.         . gabapentin (NEURONTIN) 100 MG capsule   Oral   Take 2 capsules (200 mg total) by mouth at bedtime.   60 capsule   2   . HYDROcodone-acetaminophen (NORCO) 10-325 MG per tablet   Oral   Take 1 tablet by mouth every 6 (six) hours as needed for moderate pain.          Marland Kitchen lisinopril (PRINIVIL,ZESTRIL) 20 MG tablet   Oral   Take 20 mg by mouth every morning.         . megestrol (MEGACE) 20 MG tablet   Oral   Take 20 mg by mouth every morning.         . metoprolol succinate (TOPROL-XL) 100 MG 24 hr tablet   Oral   Take 50 mg by mouth every morning. Take with or immediately following a meal.         . simvastatin (ZOCOR) 10 MG tablet   Oral   Take 10 mg by mouth every morning.         Marland Kitchen spironolactone (ALDACTONE) 25 MG tablet   Oral   Take 25 mg by mouth every morning.         . tamsulosin (FLOMAX) 0.4 MG CAPS capsule   Oral   Take 0.4 mg by mouth every morning.         . warfarin (COUMADIN) 2.5 MG tablet   Oral   Take 2.5 mg by mouth every evening.         . nitroGLYCERIN (NITROSTAT) 0.4 MG SL tablet   Sublingual   Place 1 tablet (0.4 mg total) under the tongue every 5 (five) minutes  as needed for chest pain (up to 3 doses).  25 tablet   3    BP 111/66  Temp(Src) 98.4 F (36.9 C) (Oral)  Resp 15  Ht 5\' 7"  (1.702 m)  Wt 165 lb (74.844 kg)  BMI 25.84 kg/m2  SpO2 96% Physical Exam  Nursing note and vitals reviewed. Constitutional: He is oriented to person, place, and time. He appears well-developed and well-nourished.  HENT:  Head: Normocephalic and atraumatic.  Eyes: Conjunctivae and EOM are normal. Pupils are equal, round, and reactive to light.  Neck: Normal range of motion. Neck supple.  Cardiovascular: Normal rate, regular rhythm and normal heart sounds.   Pulmonary/Chest: Effort normal and breath sounds normal.  Abdominal: Soft. Bowel sounds are normal.  Musculoskeletal: Normal range of motion.  Neurological: He is alert and oriented to person, place, and time.  Skin: Skin is warm and dry.  Psychiatric: He has a normal mood and affect. His behavior is normal.    ED Course  Procedures (including critical care time) Medications  sodium chloride 0.9 % bolus 500 mL ( Intravenous New Bag/Given 07/10/13 2229)    DIAGNOSTIC STUDIES: Oxygen Saturation is 96% on RA, normal by my interpretation.    COORDINATION OF CARE: 9:46 PM- Discussed treatment plan with pt which includes IV fluids, blood work, and CT head. Pt agrees to plan.    Labs Review Labs Reviewed  BASIC METABOLIC PANEL - Abnormal; Notable for the following:    Sodium 136 (*)    BUN 30 (*)    Creatinine, Ser 1.39 (*)    GFR calc non Af Amer 43 (*)    GFR calc Af Amer 50 (*)    All other components within normal limits  CBC WITH DIFFERENTIAL - Abnormal; Notable for the following:    WBC 2.5 (*)    HCT 37.3 (*)    RDW 17.0 (*)    Platelets 120 (*)    Neutro Abs 1.4 (*)    All other components within normal limits  URINALYSIS, ROUTINE W REFLEX MICROSCOPIC - Abnormal; Notable for the following:    Hgb urine dipstick TRACE (*)    All other components within normal limits  URINE  MICROSCOPIC-ADD ON - Abnormal; Notable for the following:    Squamous Epithelial / LPF FEW (*)    Bacteria, UA FEW (*)    Casts HYALINE CASTS (*)    All other components within normal limits   Imaging Review Ct Head Wo Contrast  07/10/2013   CLINICAL DATA:  Possible fall, confusion.  Unsteady gait.  EXAM: CT HEAD WITHOUT CONTRAST  TECHNIQUE: Contiguous axial images were obtained from the base of the skull through the vertex without intravenous contrast.  COMPARISON:  CT of the head report dated June 17, 2012 though images are not available for direct comparison.  FINDINGS: The ventricles and sulci are normal for age. No intraparenchymal hemorrhage, mass effect nor midline shift. Patchy supratentorial white matter hypodensities are less than expected for patient's age and though non-specific suggest sequelae of chronic small vessel ischemic disease. No acute large vascular territory infarcts. Focal 11 mm left corona radiata remote infarct with surrounding gliosis.  No abnormal extra-axial fluid collections. Basal cisterns are patent. Severe calcific atherosclerosis of the carotid siphons and a lesser extent vertebral artery's.  No skull fracture. Atretic maxillary sinuses with mucosal thickening on the right complete opacification on the left consistent with chronic sinusitis. Ten paranasal sinus mucosal thickening with frothy secretions in left sphenoid sinus. . The included ocular globes and orbital contents are non-suspicious. Status  post bilateral ocular lens implants. Mildly expansile cystic sellar lesion measuring at least 13 mm.  IMPRESSION: No acute intracranial process. Acute on chronic paranasal sinusitis.  Involutional changes. Mild white matter changes suggest chronic small vessel ischemic disease with remote left corona radiata infarct.  Mildly expansile cystic sellar lesion may reflect arachnoid cyst.   Electronically Signed   By: Elon Alas   On: 07/10/2013 23:40     EKG  Interpretation None      Date: 07/11/2013  Rate: 72  Rhythm: normal sinus rhythm  QRS Axis: normal  Intervals: normal  ST/T Wave abnormalities: normal  Conduction Disutrbances: LBBB;  1st degree av block  Narrative Interpretation: unremarkable    MDM   Final diagnoses:  Syncope   Patient is alert. Vital signs are stable at discharge. Screening labs, EKG, CT head all negative. Patient has primary care followup  I personally performed the services described in this documentation, which was scribed in my presence. The recorded information has been reviewed and is accurate.   Nat Christen, MD 07/11/13 [phone]

## 2013-07-11 NOTE — Discharge Instructions (Signed)
Test showed no serious problems. Drink fluids. Get up from bed slowly. Followup your primary care Dr.

## 2013-07-16 ENCOUNTER — Telehealth: Payer: Self-pay | Admitting: Family Medicine

## 2013-07-16 NOTE — Telephone Encounter (Signed)
appt scheduled for wed April 15 and patient wants to know if Scott [name] can see him sooner

## 2013-07-17 ENCOUNTER — Telehealth: Payer: Self-pay | Admitting: Family Medicine

## 2013-07-17 ENCOUNTER — Ambulatory Visit: Payer: Medicare Other | Admitting: Family Medicine

## 2013-07-18 NOTE — Telephone Encounter (Signed)
appt already set up for pt

## 2013-07-20 ENCOUNTER — Encounter: Payer: Self-pay | Admitting: Family Medicine

## 2013-07-20 ENCOUNTER — Ambulatory Visit (INDEPENDENT_AMBULATORY_CARE_PROVIDER_SITE_OTHER): Payer: Medicare Other | Admitting: Family Medicine

## 2013-07-20 VITALS — BP 175/98 | HR 80 | Temp 98.8°F | Ht 67.0 in | Wt 147.6 lb

## 2013-07-20 DIAGNOSIS — I2510 Atherosclerotic heart disease of native coronary artery without angina pectoris: Secondary | ICD-10-CM | POA: Diagnosis not present

## 2013-07-20 DIAGNOSIS — R197 Diarrhea, unspecified: Secondary | ICD-10-CM | POA: Diagnosis not present

## 2013-07-20 DIAGNOSIS — R5381 Other malaise: Secondary | ICD-10-CM

## 2013-07-20 DIAGNOSIS — E860 Dehydration: Secondary | ICD-10-CM | POA: Diagnosis not present

## 2013-07-20 DIAGNOSIS — R55 Syncope and collapse: Secondary | ICD-10-CM | POA: Diagnosis not present

## 2013-07-20 DIAGNOSIS — W19XXXA Unspecified fall, initial encounter: Secondary | ICD-10-CM

## 2013-07-20 DIAGNOSIS — R5383 Other fatigue: Secondary | ICD-10-CM | POA: Diagnosis not present

## 2013-07-20 DIAGNOSIS — I4891 Unspecified atrial fibrillation: Secondary | ICD-10-CM

## 2013-07-20 LAB — POCT CBC
Granulocyte percent: 52.8 %G (ref 37–80)
HCT, POC: 38.9 % — AB (ref 43.5–53.7)
Hemoglobin: 12.6 g/dL — AB (ref 14.1–18.1)
Lymph, poc: 1.2 (ref 0.6–3.4)
MCH, POC: 27.1 pg (ref 27–31.2)
MCHC: 32.3 g/dL (ref 31.8–35.4)
MCV: 83.9 fL (ref 80–97)
MPV: 6.9 fL (ref 0–99.8)
POC Granulocyte: 1.6 — AB (ref 2–6.9)
POC LYMPH PERCENT: 39.7 %L (ref 10–50)
Platelet Count, POC: 159 10*3/uL (ref 142–424)
RBC: 4.6 M/uL — AB (ref 4.69–6.13)
RDW, POC: 18.4 %
WBC: 3 10*3/uL — AB (ref 4.6–10.2)

## 2013-07-20 MED ORDER — DIPHENOXYLATE-ATROPINE 2.5-0.025 MG PO TABS: 1.0000 | ORAL_TABLET | Freq: Four times a day (QID) | ORAL | Status: DC | PRN

## 2013-07-20 NOTE — Progress Notes (Signed)
   Subjective:    Patient ID: Scott [name], male    DOB: [birth year], 80 y.o.   MRN: [id number]  HPI This 80 y.o. male presents for evaluation of hospital follow up on 07/10/13.  He was seen in the ED for syncope and fall.  He had labs, EKG, and CT all of which were normal and he was DC'd home. He has been having difficulty with feeling weak.   Review of Systems No chest pain, SOB, HA, dizziness, vision change, N/V, diarrhea, constipation, dysuria, urinary urgency or frequency, myalgias, arthralgias or rash.     Objective:   Physical Exam  Vital signs noted  Well developed well nourished male.  HEENT - Head atraumatic Normocephalic                Eyes - PERRLA, Conjuctiva - clear Sclera- Clear EOMI                Ears - EAC's Wnl TM's Wnl Gross Hearing WNL                Nose - Nares patent                 Throat - oropharanx wnl Respiratory - Lungs CTA bilateral Cardiac - Irregular rate and rhythm S1 and S2 without murmur GI - Abdomen soft Nontender and bowel sounds active x 4 Extremities - No edema. Neuro - Grossly intact.      Assessment & Plan:  Diarrhea - Plan: diphenoxylate-atropine (LOMOTIL) 2.5-0.025 MG per tablet  Dehydration - Plan: diphenoxylate-atropine (LOMOTIL) 2.5-0.025 MG per tablet  Syncope - Plan: diphenoxylate-atropine (LOMOTIL) 2.5-0.025 MG per tablet, POCT CBC, Holter monitor - 24 hour  Other malaise and fatigue - Plan: POCT CBC, Holter monitor - 24 hour  Atrial fibrillation - Holter monitor  Falls - Plan: Ambulatory referral to Physical Therapy  Follow up in a week  Lysbeth Penner FNP

## 2013-07-25 ENCOUNTER — Encounter: Payer: Self-pay | Admitting: Family Medicine

## 2013-07-25 ENCOUNTER — Ambulatory Visit (INDEPENDENT_AMBULATORY_CARE_PROVIDER_SITE_OTHER): Payer: Medicare Other | Admitting: Family Medicine

## 2013-07-25 VITALS — BP 138/58 | HR 72 | Temp 98.3°F | Ht 67.0 in | Wt 146.0 lb

## 2013-07-25 DIAGNOSIS — R55 Syncope and collapse: Secondary | ICD-10-CM | POA: Diagnosis not present

## 2013-07-25 DIAGNOSIS — I2510 Atherosclerotic heart disease of native coronary artery without angina pectoris: Secondary | ICD-10-CM | POA: Diagnosis not present

## 2013-07-25 DIAGNOSIS — R799 Abnormal finding of blood chemistry, unspecified: Secondary | ICD-10-CM

## 2013-07-25 DIAGNOSIS — R7989 Other specified abnormal findings of blood chemistry: Secondary | ICD-10-CM

## 2013-07-25 DIAGNOSIS — M79609 Pain in unspecified limb: Secondary | ICD-10-CM | POA: Diagnosis not present

## 2013-07-25 DIAGNOSIS — M79676 Pain in unspecified toe(s): Secondary | ICD-10-CM

## 2013-07-25 NOTE — Progress Notes (Addendum)
Subjective:    Patient ID: Scott [name], male    DOB: Oct 30, 1921, 80 y.o.   MRN: [id number]  HPI Patient here today for hospital follow up, went to [facility]. Patient went to hospital for falling and syncope. He comes to the visit today with his son. Subsequent to the hospital visit he has received confusing information about which medicines to take and whether to take them or not. After discussing this with him today and his son we decided to stay on the current medication as brought to him by the pharmacy. We will look at blood pressures in one week and then readjust the medication from that point. It is noted today that he is also taking Flomax and that this may drop his blood pressure and he will be instructed to start taking this at night time. We will tell the pharmacist to put it in his night medications instead of the morning medications. He also continues to drink some alcohol and this may play well with diuresis and hypotension and he was informed of this. He will get a Holter Dr. Darnelle Going today and he will receive lab work which will include a BMP and a uric acid because of some left great toe pain.      Patient Active Problem List   Diagnosis Date Noted  . High risk medication use 05/17/2013  . BPH (benign prostatic hyperplasia) 05/17/2013  . Vitamin D deficiency 05/17/2013  . Chronic anticoagulation- subtheraputic INR on admission 04/19/2013  . Acute pulmonary embolism 04/17/2013  . NSVT (nonsustained ventricular tachycardia)- not an ICD candidate 04/17/2013  . NICM (nonischemic cardiomyopathy)- EF 20-25% by Echo 04/12/13 04/17/2013  . Elevated troponin 04/10/2013  . Acute on chronic systolic CHF (congestive heart failure) 03/15/2013  . DVT of lower extremity, bilateral 03/15/2013  . Fatigue 11/02/2012  . Constipation 11/02/2012  . Alcohol abuse 08/27/2012  . Syncope 06/21/2012  . Bladder cancer 03/15/2012  . Benign localized hyperplasia of prostate with urinary obstruction  03/15/2012  . HTN (hypertension)   . Hyperlipidemia   . PVCs (premature ventricular contractions)   . CAD- non obstructive disease by cath 3/14    Outpatient Encounter Prescriptions as of 07/25/2013  Medication Sig  . donepezil (ARICEPT) 10 MG tablet Take 5 mg by mouth at bedtime.  . furosemide (LASIX) 20 MG tablet Take 20 mg by mouth every morning.  . gabapentin (NEURONTIN) 100 MG capsule Take 2 capsules (200 mg total) by mouth at bedtime.  Marland Kitchen HYDROcodone-acetaminophen (NORCO) 10-325 MG per tablet Take 1 tablet by mouth every 6 (six) hours as needed for moderate pain.   Marland Kitchen lisinopril (PRINIVIL,ZESTRIL) 20 MG tablet Take 20 mg by mouth every morning.  . megestrol (MEGACE) 20 MG tablet Take 20 mg by mouth every morning.  . metoprolol succinate (TOPROL-XL) 100 MG 24 hr tablet Take 50 mg by mouth every morning. Take with or immediately following a meal.  . nitroGLYCERIN (NITROSTAT) 0.4 MG SL tablet Place 1 tablet (0.4 mg total) under the tongue every 5 (five) minutes as needed for chest pain (up to 3 doses).  . simvastatin (ZOCOR) 10 MG tablet Take 10 mg by mouth every morning.  Marland Kitchen spironolactone (ALDACTONE) 25 MG tablet Take 25 mg by mouth every morning.  . tamsulosin (FLOMAX) 0.4 MG CAPS capsule Take 0.4 mg by mouth every morning.  . warfarin (COUMADIN) 2.5 MG tablet Take 2.5 mg by mouth every evening.  . [DISCONTINUED] amLODipine (NORVASC) 5 MG tablet   . [DISCONTINUED] bimatoprost (  LUMIGAN) 0.03 % ophthalmic solution Place 1 drop into both eyes at bedtime.  . [DISCONTINUED] ciclopirox (PENLAC) 8 % solution   . [DISCONTINUED] diphenoxylate-atropine (LOMOTIL) 2.5-0.025 MG per tablet Take 1 tablet by mouth 4 (four) times daily as needed for diarrhea or loose stools.    Review of Systems  Constitutional:       Fall yesterday- "tripped and fell"  HENT: Negative.   Eyes: Negative.   Respiratory: Negative.   Cardiovascular: Negative.   Gastrointestinal: Negative.   Endocrine: Negative.     Genitourinary: Negative.   Musculoskeletal: Negative.   Skin: Negative.   Allergic/Immunologic: Negative.   Neurological: Positive for syncope (episodes in recent past) and weakness. Negative for dizziness, light-headedness and headaches.  Hematological: Negative.   Psychiatric/Behavioral: Negative.        Objective:   Physical Exam  Nursing note and vitals reviewed. Constitutional: He is oriented to person, place, and time. He appears well-developed and well-nourished. No distress.  Elderly kyphotic but alert and responsive to questions appropriately  HENT:  Head: Normocephalic and atraumatic.  Right Ear: External ear normal.  Left Ear: External ear normal.  Nose: Nose normal.  Mouth/Throat: Oropharynx is clear and moist. No oropharyngeal exudate.  Eyes: Conjunctivae and EOM are normal. Pupils are equal, round, and reactive to light. Right eye exhibits no discharge. Left eye exhibits no discharge. No scleral icterus.  Neck: Normal range of motion. Neck supple. No thyromegaly present.  Cardiovascular: Normal rate and normal heart sounds.   No murmur heard. Irregular irregular rhythm at 72 per minute  Pulmonary/Chest: Effort normal and breath sounds normal. No respiratory distress. He has no wheezes. He has no rales. He exhibits no tenderness.  Abdominal: Soft. Bowel sounds are normal. He exhibits no mass. There is no tenderness. There is no rebound and no guarding.  Musculoskeletal: Normal range of motion. He exhibits no edema.  Lymphadenopathy:    He has no cervical adenopathy.  Neurological: He is alert and oriented to person, place, and time.  Skin: Skin is warm and dry. No rash noted.  Psychiatric: His behavior is normal. Judgment and thought content normal.  Admittedly frustrated and depressed because of his condition and age   BP 140/69  Pulse 72  Temp(Src) 98.3 F (36.8 C) (Oral)  Ht 5' 7"  (1.702 m)  Wt 146 lb (66.225 kg)  BMI 22.86 kg/m2  24-hour Holter monitor  will be started when available The patient's condition was discussed extensively with the son and with the patient himself.      Assessment & Plan:  1. Elevated serum creatinine - BMP8+EGFR  2. Syncope - Holter monitor - 24 hour; Future  3. Toe pain - Uric acid  4. ASCVD   Please see scanned in blood pressures from home  Patient Instructions  Take medications as directed.--- Change the Flomax to night time  Be careful not to put yourself at risks for falls. Monitor blood pressures twice daily Discontinue the alcohol Drink plenty of fluids    Arrie Senate MD

## 2013-07-25 NOTE — Patient Instructions (Addendum)
Take medications as directed.--- Change the Flomax to night time  Be careful not to put yourself at risks for falls. Monitor blood pressures twice daily Discontinue the alcohol Drink plenty of fluids

## 2013-07-26 ENCOUNTER — Ambulatory Visit: Payer: Medicare Other

## 2013-07-26 DIAGNOSIS — R55 Syncope and collapse: Secondary | ICD-10-CM

## 2013-07-26 LAB — BMP8+EGFR
BUN/Creatinine Ratio: 22 (ref 10–22)
BUN: 30 mg/dL — ABNORMAL HIGH (ref 8–27)
CO2: 20 mmol/L (ref 18–29)
Calcium: 9.3 mg/dL (ref 8.6–10.2)
Chloride: 103 mmol/L (ref 97–108)
Creatinine, Ser: 1.38 mg/dL — ABNORMAL HIGH (ref 0.76–1.27)
GFR calc Af Amer: 52 mL/min/{1.73_m2} — ABNORMAL LOW (ref >59)
GFR calc non Af Amer: 45 mL/min/{1.73_m2} — ABNORMAL LOW (ref >59)
Glucose: 108 mg/dL — ABNORMAL HIGH (ref 65–99)
Potassium: 3.8 mmol/L (ref 3.5–5.2)
Sodium: 142 mmol/L (ref 134–144)

## 2013-07-26 LAB — URIC ACID: Uric Acid: 12 mg/dL — ABNORMAL HIGH (ref 3.7–8.6)

## 2013-07-26 NOTE — Progress Notes (Signed)
Patient ID: Scott [name], male   DOB: [birth year], 80 y.o.   MRN: [id number] Placed 24 hour holter monitor on patient with no problem.

## 2013-07-29 ENCOUNTER — Other Ambulatory Visit: Payer: Self-pay | Admitting: Family Medicine

## 2013-07-30 ENCOUNTER — Ambulatory Visit: Payer: Medicare Other | Attending: Family Medicine | Admitting: Physical Therapy

## 2013-07-30 DIAGNOSIS — IMO0001 Reserved for inherently not codable concepts without codable children: Secondary | ICD-10-CM | POA: Insufficient documentation

## 2013-07-30 DIAGNOSIS — R42 Dizziness and giddiness: Secondary | ICD-10-CM | POA: Diagnosis not present

## 2013-07-30 DIAGNOSIS — I10 Essential (primary) hypertension: Secondary | ICD-10-CM | POA: Diagnosis not present

## 2013-07-31 NOTE — Telephone Encounter (Signed)
Do not see on med list? 

## 2013-08-01 ENCOUNTER — Ambulatory Visit: Payer: Medicare Other | Admitting: Physical Therapy

## 2013-08-01 DIAGNOSIS — IMO0001 Reserved for inherently not codable concepts without codable children: Secondary | ICD-10-CM | POA: Diagnosis not present

## 2013-08-01 DIAGNOSIS — R42 Dizziness and giddiness: Secondary | ICD-10-CM | POA: Diagnosis not present

## 2013-08-01 DIAGNOSIS — I10 Essential (primary) hypertension: Secondary | ICD-10-CM | POA: Diagnosis not present

## 2013-08-02 ENCOUNTER — Encounter: Payer: Self-pay | Admitting: Family Medicine

## 2013-08-02 ENCOUNTER — Other Ambulatory Visit: Payer: Self-pay | Admitting: *Deleted

## 2013-08-02 ENCOUNTER — Ambulatory Visit: Payer: Medicare Other | Admitting: Family Medicine

## 2013-08-02 VITALS — BP 148/78 | HR 84 | Temp 98.0°F | Ht 67.0 in | Wt 146.0 lb

## 2013-08-02 DIAGNOSIS — I2510 Atherosclerotic heart disease of native coronary artery without angina pectoris: Secondary | ICD-10-CM

## 2013-08-02 DIAGNOSIS — F1010 Alcohol abuse, uncomplicated: Secondary | ICD-10-CM

## 2013-08-02 DIAGNOSIS — I499 Cardiac arrhythmia, unspecified: Secondary | ICD-10-CM

## 2013-08-02 DIAGNOSIS — N400 Enlarged prostate without lower urinary tract symptoms: Secondary | ICD-10-CM

## 2013-08-02 DIAGNOSIS — R32 Unspecified urinary incontinence: Secondary | ICD-10-CM

## 2013-08-02 DIAGNOSIS — I10 Essential (primary) hypertension: Secondary | ICD-10-CM

## 2013-08-02 DIAGNOSIS — I472 Ventricular tachycardia: Secondary | ICD-10-CM

## 2013-08-02 DIAGNOSIS — R55 Syncope and collapse: Secondary | ICD-10-CM

## 2013-08-02 DIAGNOSIS — I4729 Other ventricular tachycardia: Secondary | ICD-10-CM

## 2013-08-02 MED ORDER — FEBUXOSTAT 40 MG PO TABS: 40.0000 mg | ORAL_TABLET | Freq: Every day | ORAL | Status: DC

## 2013-08-02 NOTE — Patient Instructions (Addendum)
Continue to monitor BP readings and let us review them in 2 weeks. Patient to continue Lomotil (at home PRN med) as needed for diarrhea. Eat a healthy diet and continue to exercise as tolerated. Take a multivitamin daily.  Continue exercising regularly walking carefully and with assistance if necessary Only take Lomotil as needed Review blood pressures in 2-3 week After reviewing the Holter monitor the patient will be referred back to his cardiologist to further evaluate the runs of V. tach and the possibility of this being associated with his syncope

## 2013-08-02 NOTE — Progress Notes (Signed)
Subjective:    Patient ID: Scott [name], male    DOB: 1921/05/25, 80 y.o.   MRN: [id number]  HPI Patient was visited today at home visit for follow up for weakness and blood pressure readings.  He is accompanied today by his wife, son, and a caregiver. Patient complains of some weakness and times and occasional diarrhea. We have experienced some confusion regarding patients medications- the son explained today that they were in the middle of transferring all medication to [facility] for pre-packed med packs. [facility] are no [facility]- although [facility] automatic refill request. This is to be addressed today by the son. Medications were reviewed today and they are correct in EPIC. We also reviewed patients at home blood pressure readings and a copy is to be scanned to EPIC. These readings were up and down and we have decided not to change anything at this time- but to continue monitoring this and let us review a list of readings again in approx 2 weeks. Patient has some continued back pain, weakness and he also continues to drink alcohol- this could be contributing to some of the hypotensive BP readings and some of the weakness. Patient is still participating in physical therapy twice a week.        Patient Active Problem List   Diagnosis Date Noted  . High risk medication use 05/17/2013  . BPH (benign prostatic hyperplasia) 05/17/2013  . Vitamin D deficiency 05/17/2013  . Chronic anticoagulation- subtheraputic INR on admission 04/19/2013  . Acute pulmonary embolism 04/17/2013  . NSVT (nonsustained ventricular tachycardia)- not an ICD candidate 04/17/2013  . NICM (nonischemic cardiomyopathy)- EF 20-25% by Echo 04/12/13 04/17/2013  . Elevated troponin 04/10/2013  . Acute on chronic systolic CHF (congestive heart failure) 03/15/2013  . DVT of lower extremity, bilateral 03/15/2013  . Fatigue 11/02/2012  . Constipation 11/02/2012  . Alcohol abuse 08/27/2012  . Syncope  06/21/2012  . Bladder cancer 03/15/2012  . Benign localized hyperplasia of prostate with urinary obstruction 03/15/2012  . HTN (hypertension)   . Hyperlipidemia   . PVCs (premature ventricular contractions)   . CAD- non obstructive disease by cath 3/14    Outpatient Encounter Prescriptions as of 08/02/2013  Medication Sig  . donepezil (ARICEPT) 10 MG tablet Take 5 mg by mouth at bedtime.  . furosemide (LASIX) 20 MG tablet Take 20 mg by mouth every morning.  . gabapentin (NEURONTIN) 100 MG capsule Take 2 capsules (200 mg total) by mouth at bedtime.  Marland Kitchen HYDROcodone-acetaminophen (NORCO) 10-325 MG per tablet Take 1 tablet by mouth every 6 (six) hours as needed for moderate pain.   Marland Kitchen lisinopril (PRINIVIL,ZESTRIL) 20 MG tablet Take 20 mg by mouth every morning.  . megestrol (MEGACE) 20 MG tablet Take 20 mg by mouth every morning.  . metoprolol succinate (TOPROL-XL) 100 MG 24 hr tablet Take 50 mg by mouth every morning. Take with or immediately following a meal.  . nitroGLYCERIN (NITROSTAT) 0.4 MG SL tablet Place 1 tablet (0.4 mg total) under the tongue every 5 (five) minutes as needed for chest pain (up to 3 doses).  . simvastatin (ZOCOR) 10 MG tablet Take 10 mg by mouth every morning.  Marland Kitchen spironolactone (ALDACTONE) 25 MG tablet Take 25 mg by mouth every morning.  . tamsulosin (FLOMAX) 0.4 MG CAPS capsule Take 0.4 mg by mouth every morning.  . warfarin (COUMADIN) 2.5 MG tablet Take 2.5 mg by mouth every evening.    Review of Systems  HENT: Negative.   Eyes: Negative.   Respiratory: Negative.   Cardiovascular: Negative.   Gastrointestinal: Positive for diarrhea. Nausea: off/ on- mostly at night.  Endocrine: Negative.   Genitourinary: Negative.   Musculoskeletal: Negative.   Skin: Negative.   Allergic/Immunologic: Negative.   Neurological: Positive for weakness. Negative for dizziness, light-headedness and headaches.  Hematological: Negative.   Psychiatric/Behavioral: Negative.          Objective:   Physical Exam Nursing note and vitals reveiwed. Constitutional- He is oriented to person and place. He appears well developed and well nourished. Lungs clear. Abdomen soft and non-tender. Heart- 84 /min and irregular. Skin- warm and dry. The patient was alert and cooperative and answered questions appropriately  There was no edema    BP 148/78  Pulse 84  Temp(Src) 98 F (36.7 C) (Oral)  Ht 5\' 7"  (1.702 m)  Wt 146 lb (66.225 kg)  BMI 22.86 kg/m2 There was 15 minutes face-to-face time with patient have a home visit.   The patients condition was discussed with son and himself. There have been a few episodes were patient has urinated on himself in public- this was brought up today and patient was informed of clothing/protective options such as depends that he could try.      Assessment & Plan:  History of weight loss Continue to monitor BP readings and let us review them in 2 weeks. Patient to continue Lomotil (at home PRN med) as needed for diarrhea. Eat a healthy diet and continue to exercise as tolerated. Take a multivitamin daily. Incontinence BPH--- patient sees urologist  1. Alcohol abuse  2. BPH (benign prostatic hyperplasia)  3. HTN (hypertension)  4. Urinary incontinence  5. Irregular heartbeat  6. Paroxysmal ventricular tachycardia -A Holter monitor was reviewed today and showed that PVCs and PACs with a documented run of ventricular tachycardia -This Holter result will be faxed to his cardiologist. -The patient's son will also be notified  Patient Instructions  Continue to monitor BP readings and let us review them in 2 weeks. Patient to continue Lomotil (at home PRN med) as needed for diarrhea. Eat a healthy diet and continue to exercise as tolerated. Take a multivitamin daily.  Continue exercising regularly walking carefully and with assistance if necessary Only take Lomotil as needed Review blood pressures in 2-3 week After  reviewing the Holter monitor the patient will be referred back to his cardiologist to further evaluate the runs of V. tach and the possibility of this being associated with his syncope   Arrie Senate MD

## 2013-08-07 ENCOUNTER — Ambulatory Visit: Payer: Medicare Other | Admitting: *Deleted

## 2013-08-07 DIAGNOSIS — R42 Dizziness and giddiness: Secondary | ICD-10-CM | POA: Diagnosis not present

## 2013-08-07 DIAGNOSIS — IMO0001 Reserved for inherently not codable concepts without codable children: Secondary | ICD-10-CM | POA: Diagnosis not present

## 2013-08-07 DIAGNOSIS — I10 Essential (primary) hypertension: Secondary | ICD-10-CM | POA: Diagnosis not present

## 2013-08-10 ENCOUNTER — Encounter: Payer: Medicare Other | Admitting: *Deleted

## 2013-08-14 ENCOUNTER — Ambulatory Visit: Payer: Medicare Other | Attending: Family Medicine | Admitting: *Deleted

## 2013-08-14 DIAGNOSIS — R42 Dizziness and giddiness: Secondary | ICD-10-CM | POA: Diagnosis not present

## 2013-08-14 DIAGNOSIS — I10 Essential (primary) hypertension: Secondary | ICD-10-CM | POA: Insufficient documentation

## 2013-08-14 DIAGNOSIS — IMO0001 Reserved for inherently not codable concepts without codable children: Secondary | ICD-10-CM | POA: Diagnosis not present

## 2013-08-16 ENCOUNTER — Other Ambulatory Visit: Payer: Self-pay | Admitting: Family Medicine

## 2013-08-17 ENCOUNTER — Ambulatory Visit: Payer: Medicare Other | Admitting: Physical Therapy

## 2013-08-17 DIAGNOSIS — IMO0001 Reserved for inherently not codable concepts without codable children: Secondary | ICD-10-CM | POA: Diagnosis not present

## 2013-08-21 ENCOUNTER — Ambulatory Visit: Payer: Medicare Other | Admitting: Physical Therapy

## 2013-08-21 DIAGNOSIS — IMO0001 Reserved for inherently not codable concepts without codable children: Secondary | ICD-10-CM | POA: Diagnosis not present

## 2013-08-24 ENCOUNTER — Ambulatory Visit: Payer: Medicare Other | Admitting: *Deleted

## 2013-08-24 DIAGNOSIS — IMO0001 Reserved for inherently not codable concepts without codable children: Secondary | ICD-10-CM | POA: Diagnosis not present

## 2013-08-27 ENCOUNTER — Ambulatory Visit: Payer: Medicare Other | Admitting: Physical Therapy

## 2013-08-29 ENCOUNTER — Other Ambulatory Visit: Payer: Self-pay | Admitting: Family Medicine

## 2013-08-29 ENCOUNTER — Encounter: Payer: Medicare Other | Admitting: Physical Therapy

## 2013-08-31 ENCOUNTER — Encounter: Payer: Medicare Other | Admitting: Physical Therapy

## 2013-09-05 ENCOUNTER — Other Ambulatory Visit: Payer: Self-pay | Admitting: Pharmacist

## 2013-09-07 ENCOUNTER — Ambulatory Visit: Payer: Medicare Other | Admitting: Physical Therapy

## 2013-09-07 DIAGNOSIS — IMO0001 Reserved for inherently not codable concepts without codable children: Secondary | ICD-10-CM | POA: Diagnosis not present

## 2013-09-10 ENCOUNTER — Ambulatory Visit (INDEPENDENT_AMBULATORY_CARE_PROVIDER_SITE_OTHER): Payer: Medicare Other | Admitting: Physician Assistant

## 2013-09-10 ENCOUNTER — Encounter: Payer: Self-pay | Admitting: Physician Assistant

## 2013-09-10 VITALS — BP 190/84 | HR 92 | Ht 67.0 in | Wt 145.1 lb

## 2013-09-10 DIAGNOSIS — I472 Ventricular tachycardia, unspecified: Secondary | ICD-10-CM

## 2013-09-10 DIAGNOSIS — I428 Other cardiomyopathies: Secondary | ICD-10-CM

## 2013-09-10 DIAGNOSIS — I4729 Other ventricular tachycardia: Secondary | ICD-10-CM

## 2013-09-10 DIAGNOSIS — I10 Essential (primary) hypertension: Secondary | ICD-10-CM

## 2013-09-10 DIAGNOSIS — F1010 Alcohol abuse, uncomplicated: Secondary | ICD-10-CM

## 2013-09-10 DIAGNOSIS — I2510 Atherosclerotic heart disease of native coronary artery without angina pectoris: Secondary | ICD-10-CM

## 2013-09-10 MED ORDER — METOPROLOL SUCCINATE ER 50 MG PO TB24: 50.0000 mg | ORAL_TABLET | Freq: Two times a day (BID) | ORAL | Status: DC

## 2013-09-10 MED ORDER — FUROSEMIDE 20 MG PO TABS: ORAL_TABLET | ORAL | Status: DC

## 2013-09-10 NOTE — Progress Notes (Signed)
HPI: This is an 80 year old male patient of Dr. Percival Spanish who was referred to [place] by Dr. Redge Gainer for further evaluation of syncope and recent Holter monitor on 08/02/13 showing several runs of nonsustained V. tach from 3 beats to 40 beat run. Labs on 07/25/13 potassium was 3.8 creatinine 1.3 His last 2-D echo in January 2015 EF was 20-25%. This was a limited study. Had moderate LVH with diffuse hypokinesis with regional variation worse inferior lateral wall.  Patient has had syncope in the past that was thought to be orthostatic related to several medications. He also has a history of bilateral DVT in December 2014 placed on Coumadin. His depressed ejection fraction was felt related to ongoing alcohol use. He also had elevated troponin and was found to have small pulmonary emboli. He had decompensated heart failure that admission as well.  The patient is brought into the office today by his caregiver. I also spoke to his grandson Gerrit Friends on the phone. The patient has had 2-3 episodes of syncope with fall per week. The patient clearly denies this. The caregiver said he got up from the table to walk to the refrigerator and fell to the floor hitting his head last week. The patient has no recollection of this. He denies any chest pain, palpitations, dizziness or presyncope. The caregiver states that he is only out for a few seconds before he comes around quickly.Marland Kitchen He was assessed by his granddaughter who is an M.D. in [place]. He did not go to the emergency room. I talked to the patient and grandson in detail about possible defibrillator. The patient returns 89 tomorrow. He is pretty functional. He is still driving despite being told not to. This grandson would like him to be  considered for a defibrillator. He is also on Coumadin. He has not had any excessive bruising or bleeding from falls. He also continues to drink alcohol despite denying this. He has some edema and some dyspnea on exertion. He is very stoic.    Allergies -- Lipitor [Atorvastatin] -- Other (See Comments)   --  unknown  Current Outpatient Prescriptions on File Prior to Visit: donepezil (ARICEPT) 10 MG tablet, Take 5 mg by mouth at bedtime., Disp: , Rfl:  febuxostat (ULORIC) 40 MG tablet, Take 1 tablet (40 mg total) by mouth daily., Disp: 30 tablet, Rfl: 3 furosemide (LASIX) 20 MG tablet, Take 20 mg by mouth every morning., Disp: , Rfl:  gabapentin (NEURONTIN) 100 MG capsule, TAKE 2 CAPSULES AT BEDTIME, Disp: 60 capsule, Rfl: 3 HYDROcodone-acetaminophen (NORCO) 10-325 MG per tablet, Take 1 tablet by mouth every 6 (six) hours as needed for moderate pain. , Disp: , Rfl:  lisinopril (PRINIVIL,ZESTRIL) 20 MG tablet, TAKE 1 TABLET ONCE A DAY, Disp: 30 tablet, Rfl: 5 megestrol (MEGACE) 20 MG tablet, TAKE 1 TABLET ONCE A DAY, Disp: 30 tablet, Rfl: 2 metoprolol succinate (TOPROL-XL) 100 MG 24 hr tablet, Take 50 mg by mouth every morning. Take with or immediately following a meal., Disp: , Rfl:  nitroGLYCERIN (NITROSTAT) 0.4 MG SL tablet, Place 1 tablet (0.4 mg total) under the tongue every 5 (five) minutes as needed for chest pain (up to 3 doses)., Disp: 25 tablet, Rfl: 3 simvastatin (ZOCOR) 10 MG tablet, Take 10 mg by mouth every morning., Disp: , Rfl:  spironolactone (ALDACTONE) 25 MG tablet, Take 25 mg by mouth every morning., Disp: , Rfl:  tamsulosin (FLOMAX) 0.4 MG CAPS capsule, TAKE (1) CAPSULE DAILY, Disp: 30 capsule, Rfl: 3 warfarin (COUMADIN) 2.5 MG tablet,  Take 2.5 mg by mouth every evening., Disp: , Rfl:  warfarin (COUMADIN) 2.5 MG tablet, TAKE 1 TABLET DAILY, Disp: 30 tablet, Rfl: 0  No current facility-administered medications on file prior to visit.   Past Medical History:   HTN (hypertension)                                           Hyperlipidemia                                               PVCs (premature ventricular contractions)                    Glaucoma                                                     PVD  (peripheral vascular disease)                              Comment:Right ABI .75, Left .78 (2006)   Popliteal artery aneurysm, bilateral            DECUMENTE*   Frequency of urination                                       Nocturia                                                     Bladder cancer                                  03/15/2012    Chronic systolic CHF (congestive heart failure)                Comment:a. NICM - patent cors 06/2012, EF 40% at that               time. b. 03/2013 eval: EF 20-25%.   DDD (degenerative disc disease)                              Arthritis                                                    DVT, bilateral lower limbs                      03/2013        Comment:a. Bilat DVT dx 03/2013.   Pulmonary embolism  Comment:a. By CT angio 04/2013.   Elevated troponin                                              Comment:a. Adm 12/30-04/2013 - not felt to represent               ACS; ?due to PE. b. Patent cors 06/2012.   NSVT (nonsustained ventricular tachycardia)                    Comment:a. NSVT 06/2012; NSVT also seen during 03/2013               adm. b. Med rx. Not candidate for ICD given adv              age.   ETOH abuse                                                   Syncope                                                        Comment:a. Felt to be postural syncope related to               diuretics 06/2012.  Past Surgical History:   TRANSTHORACIC ECHOCARDIOGRAM                     09-22-2010     Comment:MODERATE LVH/ EF 97%/ GRADE I DIASTOLIC               DYSFUNCTION/ AORTIC SCLEROSIS WITHOUT STENOSIS/              RV  SYSTOLIC  MILDLY REDUCED FUNCTION   CARDIOVASCULAR STRESS TEST                       10-15-2010     Comment:LOW RISK NUCLEAR STUDY/ NO EVIDENCE OF               ISCHEMIA/ NORMAL EF   SHOULDER SURGERY                                 1970's         Comment:"don't remember which side or what kind  of OR"   TRANSURETHRAL RESECTION OF BLADDER TUMOR         10/07/2011      Comment:Procedure: TRANSURETHRAL RESECTION OF BLADDER               TUMOR (TURBT);  Surgeon: Malka So, MD;                Location: [facility];  Service:              Urology;  Laterality: N/A;   CYSTOSCOPY  10/07/2011      Comment:Procedure: CYSTOSCOPY;  Surgeon: Malka So,               MD;  Location: [facility];                Service: Urology;  Laterality: N/A;   CYSTOSCOPY WITH BIOPSY                           03/14/2012      Comment:Procedure: CYSTOSCOPY WITH BIOPSY;  Surgeon:               Malka So, MD;  Location: [facility];  Service:               Urology;  Laterality: N/A;  WITH FULGURATION   TRANSURETHRAL RESECTION OF PROSTATE              03/14/2012      Comment:Procedure: TRANSURETHRAL RESECTION OF THE               PROSTATE WITH GYRUS INSTRUMENTS;  Surgeon: Malka So, MD;  Location: [facility];  Service:               Urology;  Laterality: N/A;   TONSILLECTOMY                                                 CATARACT EXTRACTION W/ INTRAOCULAR LENS  IMPLA* Bilateral              CARDIAC CATHETERIZATION                          01-30-200*     Comment:MINIMAL CORANARY PLAQUE/ NORMAL LVF/ EF 55%/                NON-OBSTRUCTIVE LAD 25%   CARDIAC CATHETERIZATION                          06/2012      Review of patient's family history indicates:   Hypertension                   Mother                   Lung cancer                    Sister                   Social History   Marital Status: Married             Spouse Name:                      Years of Education:                 Number of children: 1           Occupational History Occupation          Tax inspector              Retired  Social History Main Topics   Smoking Status: Former Smoker                   Packs/Day:  1.00  Years: 5        Types: Cigarettes     Start date: 04/12/1942     Quit date: 04/13/1975   Smokeless Status: Never Used                       Alcohol Use: Yes           0.0 oz/week      Comment: 04/10/2013 "trying to quit alcohol; don't               know how much I drink; drink whenever I                feel like it"   Drug Use: No             Sexual Activity: No                 Other Topics            Concern   None on file  Social History Narrative   Lives in [place] with spouse who is s/p spinal cord injury.  He has one grown son who lives in [place].  He is retired but worked for [employer] for years and lives in [place].    ROS: See history of present illness otherwise negative   PHYSICAL EXAM: Well-nournished, in no acute distress. Neck: Increased JVD, HJR, no Bruit, or thyroid enlargement  Lungs: Lungs are clear, No tachypnea, clear without wheezing, rales, or rhonchi  Cardiovascular: RRR, distant heart sounds positive S4 with 1/6 systolic murmur at the left sternal border, no bruit, thrill, or heave.  Abdomen: BS normal. Soft without organomegaly, masses, lesions or tenderness.  Extremities: +1-2 ankle edema bilaterally otherwise lower extremities without cyanosis, clubbing or edema. Decreased distal pulses bilateral  SKin: Warm, no lesions or rashes   Musculoskeletal: No deformities  Neuro: no focal signs  BP 190/84  Pulse 92  Ht 5\' 7"  (1.702 m)  Wt 145 lb 1.9 oz (65.826 kg)  BMI 22.72 kg/m2    EKG: Normal sinus rhythm at 92 beats per minute with LVH and frequent PVCs  2-D echo 04/12/13 Study Conclusions  - Left ventricle: The cavity size was normal. Wall thickness   was increased in a pattern of moderate LVH. Systolic   function was severely reduced. The estimated ejection   fraction was in the range of 20% to 25%. Diffuse   hypokinesis with some regional variation. There is severe   hypokinesis of the basal-midinferolateral myocardium.  The   study is not technically sufficient to allow evaluation of   LV diastolic function. - Aortic valve: Moderately calcified annulus. Trileaflet;   mildly thickened, mildly calcified leaflets. Small mobile   echodensity seen at noncoronary leaflet tip - consistent   with Lambls excrescence, less likely small vegetation. - Mitral valve: Trivial regurgitation. - Left atrium: The atrium was moderately dilated. - Right ventricle: The cavity size was normal. Systolic   function was severely reduced. - Right atrium: Central venous pressure: 21mm Hg (est). - Atrial septum: No defect or patent foramen ovale was   identified. - Tricuspid valve: Trivial regurgitation. - Pulmonary arteries: Systolic pressure could not be   accurately estimated. - Pericardium, extracardiac: A trivial pericardial effusion   was identified posterior to  the heart. Impressions:  - Limited study. Recent complete study on December 4 noted.   Moderate LVH with LVEF approximately 20-25% (measurement   of LVEF by border tracking does not appear accurate).   Diastolic function not evaluated. Diffuse hypokinesis with   regional variation, worse inferolateral wall. Other   findings as noted above. 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   No color Doppler data provided for aortic valve. Other   findings as outlined.

## 2013-09-10 NOTE — Assessment & Plan Note (Signed)
Caregiver states patient's blood pressure is usually 157/90 in the morning but then is much better in the evening. All increase his metoprolol to 50 mg twice a day. They will keep track of his pulse and blood pressure and bring them to the office visit on Friday.

## 2013-09-10 NOTE — Patient Instructions (Addendum)
Your physician recommends that you schedule a follow-up appointment in: [place] DR. ALLRED September 12, 2013 AT 12:15PM  BRING BLOOD PRESSURE READINGS AND PULSE READINGS ON Friday WHEN YOU COME IN TO SEE DR. ALLRED  Your physician has recommended you make the following change in your medication:   INCREASE METOPROLOL 50 MG TWICE A DAY (TWELVE HOURS APART)  INCREASE LASIX 2 TABLETS ONCE A DAY FOR THREE DAYS AND RETURN TO NORMAL DOSE  YOUR PROVIDER RECOMMENDS THAT YOU STOP DRIVING DUE TO YOUR PASSING OUT   2 Gram Low Sodium Diet A 2 gram sodium diet restricts the amount of sodium in the diet to no more than 2 g or 2000 mg daily. Limiting the amount of sodium is often used to help lower blood pressure. It is important if you have heart, liver, or kidney problems. Many foods contain sodium for flavor and sometimes as a preservative. When the amount of sodium in a diet needs to be low, it is important to know what to look for when choosing foods and drinks. The following includes some information and guidelines to help make it easier for you to adapt to a low sodium diet. QUICK TIPS  Do not add salt to food.  Avoid convenience items and fast food.  Choose unsalted snack foods.  Buy lower sodium products, often labeled as "lower sodium" or "no salt added."  Check food labels to learn how much sodium is in 1 serving.  When eating at a restaurant, ask that your food be prepared with less salt or none, if possible. READING FOOD LABELS FOR SODIUM INFORMATION The nutrition facts label is a good place to find how much sodium is in foods. Look for products with no more than 500 to 600 mg of sodium per meal and no more than 150 mg per serving. Remember that 2 g = 2000 mg. The food label may also list foods as:  Sodium-free: Less than 5 mg in a serving.  Very low sodium: 35 mg or less in a serving.  Low-sodium: 140 mg or less in a serving.  Light in sodium: 50% less sodium in a  serving. For example, if a food that usually has 300 mg of sodium is changed to become light in sodium, it will have 150 mg of sodium.  Reduced sodium: 25% less sodium in a serving. For example, if a food that usually has 400 mg of sodium is changed to reduced sodium, it will have 300 mg of sodium. CHOOSING FOODS Grains  Avoid: Salted crackers and snack items. Some cereals, including instant hot cereals. Bread stuffing and biscuit mixes. Seasoned rice or pasta mixes.  Choose: Unsalted snack items. Low-sodium cereals, oats, puffed wheat and rice, shredded wheat. English muffins and bread. Pasta. Meats  Avoid: Salted, canned, smoked, spiced, pickled meats, including fish and poultry. Bacon, ham, sausage, cold cuts, hot dogs, anchovies.  Choose: Low-sodium canned tuna and salmon. Fresh or frozen meat, poultry, and fish. Dairy  Avoid: Processed cheese and spreads. Cottage cheese. Buttermilk and condensed milk. Regular cheese.  Choose: Milk. Low-sodium cottage cheese. Yogurt. Sour cream. Low-sodium cheese. Fruits and Vegetables  Avoid: Regular canned vegetables. Regular canned tomato sauce and paste. Frozen vegetables in sauces. Olives. Angie Fava. Relishes. Sauerkraut.  Choose: Low-sodium canned vegetables. Low-sodium tomato sauce and paste. Frozen or fresh vegetables. Fresh and frozen fruit. Condiments  Avoid: Canned and packaged gravies. Worcestershire sauce. Tartar sauce. Barbecue sauce. Soy sauce. Steak sauce. Ketchup. Onion, garlic, and table salt. Meat  flavorings and tenderizers.  Choose: Fresh and dried herbs and spices. Low-sodium varieties of mustard and ketchup. Lemon juice. Tabasco sauce. Horseradish. SAMPLE 2 GRAM SODIUM MEAL PLAN Breakfast / Sodium (mg)  1 cup low-fat milk / 308 mg  2 slices whole-wheat toast / 270 mg  1 tbs heart-healthy margarine / 153 mg  1 hard-boiled egg / 139 mg  1 small orange / 0 mg Lunch / Sodium (mg)  1 cup raw carrots / 76 mg   cup  hummus / 298 mg  1 cup low-fat milk / 143 mg   cup red grapes / 2 mg  1 whole-wheat pita bread / 356 mg Dinner / Sodium (mg)  1 cup whole-wheat pasta / 2 mg  1 cup low-sodium tomato sauce / 73 mg  3 oz lean ground beef / 57 mg  1 small side salad (1 cup raw spinach leaves,  cup cucumber,  cup yellow bell pepper) with 1 tsp olive oil and 1 tsp red wine vinegar / 25 mg Snack / Sodium (mg)  1 container low-fat vanilla yogurt / 107 mg  3 graham cracker squares / 127 mg Nutrient Analysis  Calories: 2033  Protein: 77 g  Carbohydrate: 282 g  Fat: 72 g  Sodium: 1971 mg Document Released: 03/29/2005 Document Revised: 06/21/2011 Document Reviewed: 06/30/2009 [employer] Patient Information 2014 [place].

## 2013-09-10 NOTE — Assessment & Plan Note (Signed)
Patient's blood pressure is elevated today and he has mild edema. Recommend 2 g sodium diet. Increase Lasix for 3 days.

## 2013-09-10 NOTE — Assessment & Plan Note (Addendum)
Patient had extra fluid on board today. Increase Lasix to 40 mg daily for 3 days. We will give him a 2 g sodium diet. He has several cooks coming in different days of the week and is getting excess of sodium in his diet.

## 2013-09-10 NOTE — Assessment & Plan Note (Signed)
Patient has nonsustained V. tach with recent Holter monitor documenting 40 beat run of V. tach. Patient is having 2-3 episodes of syncope per week. The son would like him to be considered for possible defibrillator. The patient returns 89 tomorrow. The patient will think about this and they will talk to Dr. Rayann Heman on Friday.

## 2013-09-10 NOTE — Assessment & Plan Note (Addendum)
Patient is having recurrent syncope with fall with documented ventricular tachycardia on recent Holter monitor. I had a long discussion with the patient, caregiver and grandson concerning his syncope and risk of sudden cardiac death with an ejection fraction of 25%. I discussed possible defibrillator as well as end-of-life issues as he turns 89 tomorrow. He will think about the defibrillator and talk to his family. I've scheduled him an appointment with Dr. Rayann Heman on Friday to further discuss. Risk of Coumadin with recurrent falls also needs to be addressed.

## 2013-09-10 NOTE — Assessment & Plan Note (Signed)
Caregiver states the patient still drinking alcohol. She could not give me the amount.

## 2013-09-11 ENCOUNTER — Telehealth: Payer: Self-pay | Admitting: Pharmacist

## 2013-09-11 NOTE — Telephone Encounter (Signed)
Although called were not documented Rochele Raring did try to contact patient 08/02/13 and 08/16/2013 to schedule recheck INR and was unable to reach him. I was not able to contact patient but I did speak with [name] his son.  He is going to bring patient in tomorrow to check INR. His son mentioned that warfarin was adjusted on Monday 09/10/13 but I could not find an INR result from that date.  In reviewing notes from cardiology visit on Monday there is a possibility that the patient might be taken off warfarin due to increased falls (syncopal episodes) and alcohol use.  He is to see Cardiologist Friday to assess situation.

## 2013-09-12 ENCOUNTER — Ambulatory Visit (INDEPENDENT_AMBULATORY_CARE_PROVIDER_SITE_OTHER): Payer: Medicare Other | Admitting: Pharmacist

## 2013-09-12 DIAGNOSIS — I2699 Other pulmonary embolism without acute cor pulmonale: Secondary | ICD-10-CM

## 2013-09-12 DIAGNOSIS — I82409 Acute embolism and thrombosis of unspecified deep veins of unspecified lower extremity: Secondary | ICD-10-CM

## 2013-09-12 DIAGNOSIS — I82403 Acute embolism and thrombosis of unspecified deep veins of lower extremity, bilateral: Secondary | ICD-10-CM

## 2013-09-12 LAB — POCT INR: INR: 1.1

## 2013-09-12 NOTE — Patient Instructions (Signed)
Anticoagulation Dose Instructions as of 09/12/2013     Dorene Grebe Tue Wed Thu Fri Sat   New Dose 2.5 mg 2.5 mg 5 mg 2.5 mg 5 mg 2.5 mg 5 mg    Description       Take 2 tablet for the next 2 days, then increase warfarin to 2 tablets on tuesdays, thursdays and Saturdays and 1 tablet all other days.      INR was 1.1 today (too thick)

## 2013-09-14 ENCOUNTER — Ambulatory Visit (INDEPENDENT_AMBULATORY_CARE_PROVIDER_SITE_OTHER): Payer: Medicare Other | Admitting: Internal Medicine

## 2013-09-14 ENCOUNTER — Encounter: Payer: Self-pay | Admitting: Internal Medicine

## 2013-09-14 VITALS — BP 136/78 | HR 66 | Ht 67.0 in | Wt 149.2 lb

## 2013-09-14 DIAGNOSIS — R55 Syncope and collapse: Secondary | ICD-10-CM

## 2013-09-14 DIAGNOSIS — I428 Other cardiomyopathies: Secondary | ICD-10-CM

## 2013-09-14 DIAGNOSIS — I493 Ventricular premature depolarization: Secondary | ICD-10-CM

## 2013-09-14 DIAGNOSIS — F1010 Alcohol abuse, uncomplicated: Secondary | ICD-10-CM

## 2013-09-14 DIAGNOSIS — I4949 Other premature depolarization: Secondary | ICD-10-CM

## 2013-09-14 DIAGNOSIS — I2510 Atherosclerotic heart disease of native coronary artery without angina pectoris: Secondary | ICD-10-CM

## 2013-09-14 NOTE — Patient Instructions (Signed)
Your physician recommends that you schedule a follow-up appointment with Dr Percival Spanish as scheduled   Your physician has recommended you make the following change in your medication:  1) Stop Flomax   Stop Alcohol

## 2013-09-16 NOTE — Progress Notes (Signed)
PCP:  Redge Gainer, MD Primary Cardiologist:  Dr Percival Spanish  The patient presents today for electrophysiology followup. I have seen this gentleman several times previously for similar postural dizziness and syncope.  He seems to be doing about the same.  He continues to drink heavily.  He recently received a DUI.  When I saw him last, I recommended that he follow-up with urology to switch terazosin to finasteride due to ongoing postural symptoms.  He did not follow-up with this recommendation.  Upon discussion of current state, the patient is quite stoic.  He states that he is doing "fine".  He really doesn't seem to have any complaints.  The majority of the history is provided by his son who accompanies him today.  The family is concerned that the patient has developed cognitive decline.  They are also worried about unsteadiness.  They recognize that he continues to drink vodka and ETOH frequently. Today, he denies symptoms of palpitations, chest pain, shortness of breath, orthopnea, PND, or lower extremity edema.  The patient feels that he is tolerating medications without difficulties and is otherwise without complaint today.   Past Medical History  Diagnosis Date  . HTN (hypertension)   . Hyperlipidemia   . PVCs (premature ventricular contractions)   . Glaucoma   . PVD (peripheral vascular disease)     Right ABI .75, Left .78 (2006)  . Popliteal artery aneurysm, bilateral DECUMENTED CHRONIC PARTIAL OCCLUSION--  PT DENIES CLAUDICATION OR ANY OTHER SYMPTOMS  . Frequency of urination   . Nocturia   . Bladder cancer 03/15/2012  . Chronic systolic CHF (congestive heart failure)     a. NICM - patent cors 06/2012, EF 40% at that time. b. 03/2013 eval: EF 20-25%.  . DDD (degenerative disc disease)   . Arthritis   . DVT, bilateral lower limbs 03/2013    a. Bilat DVT dx 03/2013.  . Pulmonary embolism     a. By CT angio 04/2013.  Marland Kitchen Elevated troponin     a. Adm 12/30-04/2013 - not felt to  represent ACS; ?due to PE. b. Patent cors 06/2012.  Marland Kitchen NSVT (nonsustained ventricular tachycardia)     a. NSVT 06/2012; NSVT also seen during 03/2013 adm. b. Med rx. Not candidate for ICD given adv age.  Marland Kitchen ETOH abuse   . Syncope     a. Felt to be postural syncope related to diuretics 06/2012.   Past Surgical History  Procedure Laterality Date  . Transthoracic echocardiogram  09-22-2010    MODERATE LVH/ EF 32%/ GRADE I DIASTOLIC DYSFUNCTION/ AORTIC SCLEROSIS WITHOUT STENOSIS/  RV  SYSTOLIC  MILDLY REDUCED FUNCTION  . Cardiovascular stress test  10-15-2010    LOW RISK NUCLEAR STUDY/ NO EVIDENCE OF ISCHEMIA/ NORMAL EF  . Shoulder surgery  1970's    "don't remember which side or what kind of OR"  . Transurethral resection of bladder tumor  10/07/2011    Procedure: TRANSURETHRAL RESECTION OF BLADDER TUMOR (TURBT);  Surgeon: Malka So, MD;  Location[facility]  Service: Urology;  Laterality: N/A;  . Cystoscopy  10/07/2011    Procedure: CYSTOSCOPY;  Surgeon: Malka So, MD;  Location[facility];  Service: Urology;  Laterality: N/A;  . Cystoscopy with biopsy  03/14/2012    Procedure: CYSTOSCOPY WITH BIOPSY;  Surgeon: Malka So, MD;  Location: [facility];  Service: Urology;  Laterality: N/A;  WITH FULGURATION  . Transurethral resection of prostate  03/14/2012    Procedure:  TRANSURETHRAL RESECTION OF THE PROSTATE WITH GYRUS INSTRUMENTS;  Surgeon: Malka So, MD;  Location: [facility];  Service: Urology;  Laterality: N/A;  . Tonsillectomy    . Cataract extraction w/ intraocular lens  implant, bilateral Bilateral   . Cardiac catheterization  05-11-2004  DR [place] PLAQUE/ NORMAL LVF/ EF 55%/  NON-OBSTRUCTIVE LAD 25%  . Cardiac catheterization  06/2012    Current Outpatient Prescriptions  Medication Sig Dispense Refill  . donepezil (ARICEPT) 10 MG tablet Take 5 mg by mouth at bedtime.      . febuxostat (ULORIC) 40 MG tablet Take 1 tablet (40 mg  total) by mouth daily.  30 tablet  3  . furosemide (LASIX) 20 MG tablet TAKE 2 EXTRA TABLETS FOR 3 DAYS AND RETURN TO REGULAR DOSE 20 MG DAILY  36 tablet  4  . gabapentin (NEURONTIN) 100 MG capsule TAKE 2 CAPSULES AT BEDTIME  60 capsule  3  . HYDROcodone-acetaminophen (NORCO) 10-325 MG per tablet Take 1 tablet by mouth every 6 (six) hours as needed for moderate pain.       Marland Kitchen lisinopril (PRINIVIL,ZESTRIL) 20 MG tablet TAKE 1 TABLET ONCE A DAY  30 tablet  5  . megestrol (MEGACE) 20 MG tablet TAKE 1 TABLET ONCE A DAY  30 tablet  2  . metoprolol succinate (TOPROL-XL) 50 MG 24 hr tablet Take 1 tablet (50 mg total) by mouth 2 (two) times daily.  60 tablet  3  . nitroGLYCERIN (NITROSTAT) 0.4 MG SL tablet Place 1 tablet (0.4 mg total) under the tongue every 5 (five) minutes as needed for chest pain (up to 3 doses).  25 tablet  3  . simvastatin (ZOCOR) 10 MG tablet Take 10 mg by mouth every morning.      Marland Kitchen spironolactone (ALDACTONE) 25 MG tablet Take 25 mg by mouth every morning.      . warfarin (COUMADIN) 2.5 MG tablet Take 2.5 mg by mouth every evening.       No current facility-administered medications for this visit.    Allergies  Allergen Reactions  . Lipitor [Atorvastatin] Other (See Comments)    unknown    History   Social History  . Marital Status: Married    Spouse Name: N/A    Number of Children: 1  . Years of Education: N/A   Occupational History  . Retired    Social History Main Topics  . Smoking status: Former Smoker -- 1.00 packs/day for 20 years    Types: Cigarettes    Start date: 04/12/1942    Quit date: 04/13/1975  . Smokeless tobacco: Never Used  . Alcohol Use: 0.0 oz/week     Comment: 04/10/2013 "trying to quit alcohol; don't know how much I drink; drink whenever I feel like it"  . Drug Use: No  . Sexual Activity: No   Other Topics Concern  . Not on file   Social History Narrative   Lives in [place] with spouse who is s/p spinal cord injury.  He has one  grown son who lives in [place].  He is retired but worked for [employer] for years and lives in [place].    Family History  Problem Relation Age of Onset  . Hypertension Mother   . Lung cancer Sister     ROS-  All systems are reviewed and are negative except as outlined in the HPI above  Physical Exam: Filed Vitals:   09/14/13 1229  BP: 136/78  Pulse:  66  Height: 5\' 7"  (1.702 m)  Weight: 149 lb 3.2 oz (67.677 kg)    GEN- The patient is elderly appearing, alert and oriented x 3 today.   Head- normocephalic, atraumatic Eyes-  Sclera clear, conjunctiva pink Ears- hearing intact Oropharynx- clear Neck- supple,  Lungs- Clear to ausculation bilaterally, normal work of breathing Heart- Regular rate and rhythm  GI- soft, NT, ND, + BS Extremities- no clubbing, cyanosis, trace edema MS- age appropriate atrophy Skin- no rash or lesion Psych- euthymic mood, full affect Neuro- strength and sensation are intact  Epic notes are reviewed  Assessment and Plan:   1. Postural dizziness/ syncope The patient continues to have ongoing issues with postural presyncope/ syncope.  I have seen him several times for this in the past. I have recommended ETOH cessation as well as switching terazosin to finasteride.  Unfortunately the patient did not comply with this.  I was also clear with the patient and his family that he should no longer drive.  He continues to drive and in fact has recently received a DUI.  I have again been very clear that he should not drive.  2. Systolic dysfunction Likely due to ongoing heavy ETOH.  I have again strongly advised cessation of ETOH.  Medical management per Dr Percival Spanish. The patients family inquires about an ICD.  The patient however does not seem very interested.  Given ongoing heavy ETOH, progressive cognitive decline, and advanced age, he is not a candidate for an ICD.   I worry that the patients family does not have a clear understanding of his advanced  age.  I spoke at length with the patients son and also his daughter (a physician) by phone.  I have encouraged that we begin to focus our efforts on quality of life measures.  I think that a conservative plan would be most appropriate.  I do not think that aggressive measures would offer benefit to this patient long term.  The most obvious measures that would help this gentleman are ETOH avoidance and medical compliance.  He will follow-up with Dr Percival Spanish and I will see as needed going forward.

## 2013-09-18 ENCOUNTER — Ambulatory Visit: Payer: Medicare Other | Admitting: Family Medicine

## 2013-09-26 ENCOUNTER — Other Ambulatory Visit: Payer: Self-pay | Admitting: Family Medicine

## 2013-10-02 ENCOUNTER — Ambulatory Visit (INDEPENDENT_AMBULATORY_CARE_PROVIDER_SITE_OTHER): Payer: Medicare Other | Admitting: Pharmacist Clinician (PhC)/ Clinical Pharmacy Specialist

## 2013-10-02 ENCOUNTER — Other Ambulatory Visit: Payer: Medicare Other

## 2013-10-02 DIAGNOSIS — I2699 Other pulmonary embolism without acute cor pulmonale: Secondary | ICD-10-CM

## 2013-10-02 DIAGNOSIS — I82409 Acute embolism and thrombosis of unspecified deep veins of unspecified lower extremity: Secondary | ICD-10-CM | POA: Diagnosis not present

## 2013-10-02 LAB — POCT INR: INR: 1.3

## 2013-10-02 NOTE — Addendum Note (Signed)
Addended by: Memory Argue on: 10/02/2013 01:03 PM   Modules accepted: Level of Service

## 2013-10-02 NOTE — Progress Notes (Signed)
Patient was instructed to increase warfarin to 2 tablets every day (5mg ) except on Mondays and Fridays to take 1 tablet (2.5mg ).  And recheck his INR 1 week from today.  Very little increase in his INR from last visit till today.

## 2013-10-08 DIAGNOSIS — R0989 Other specified symptoms and signs involving the circulatory and respiratory systems: Secondary | ICD-10-CM | POA: Diagnosis not present

## 2013-10-08 DIAGNOSIS — Z87891 Personal history of nicotine dependence: Secondary | ICD-10-CM | POA: Diagnosis not present

## 2013-10-08 DIAGNOSIS — I10 Essential (primary) hypertension: Secondary | ICD-10-CM | POA: Diagnosis not present

## 2013-10-08 DIAGNOSIS — Z79899 Other long term (current) drug therapy: Secondary | ICD-10-CM | POA: Diagnosis not present

## 2013-10-08 DIAGNOSIS — I519 Heart disease, unspecified: Secondary | ICD-10-CM | POA: Diagnosis not present

## 2013-10-08 DIAGNOSIS — R0602 Shortness of breath: Secondary | ICD-10-CM | POA: Diagnosis not present

## 2013-10-08 DIAGNOSIS — Z7982 Long term (current) use of aspirin: Secondary | ICD-10-CM | POA: Diagnosis not present

## 2013-10-08 DIAGNOSIS — K5900 Constipation, unspecified: Secondary | ICD-10-CM | POA: Diagnosis not present

## 2013-10-08 DIAGNOSIS — R748 Abnormal levels of other serum enzymes: Secondary | ICD-10-CM | POA: Diagnosis not present

## 2013-10-08 DIAGNOSIS — I509 Heart failure, unspecified: Secondary | ICD-10-CM | POA: Diagnosis not present

## 2013-10-08 DIAGNOSIS — J3489 Other specified disorders of nose and nasal sinuses: Secondary | ICD-10-CM | POA: Diagnosis not present

## 2013-10-09 ENCOUNTER — Encounter (HOSPITAL_COMMUNITY): Payer: Self-pay | Admitting: General Practice

## 2013-10-09 ENCOUNTER — Inpatient Hospital Stay (HOSPITAL_COMMUNITY)
Admission: RE | Admit: 2013-10-09 | Discharge: 2013-10-11 | DRG: 280 | Disposition: A | Discharge status: Home Health | Payer: Medicare Other | Source: Other Acute Inpatient Hospital | Attending: Internal Medicine | Admitting: Internal Medicine

## 2013-10-09 DIAGNOSIS — I472 Ventricular tachycardia, unspecified: Secondary | ICD-10-CM | POA: Diagnosis present

## 2013-10-09 DIAGNOSIS — I5023 Acute on chronic systolic (congestive) heart failure: Secondary | ICD-10-CM | POA: Diagnosis not present

## 2013-10-09 DIAGNOSIS — Z7901 Long term (current) use of anticoagulants: Secondary | ICD-10-CM

## 2013-10-09 DIAGNOSIS — Z86718 Personal history of other venous thrombosis and embolism: Secondary | ICD-10-CM

## 2013-10-09 DIAGNOSIS — I509 Heart failure, unspecified: Secondary | ICD-10-CM | POA: Diagnosis present

## 2013-10-09 DIAGNOSIS — Z79899 Other long term (current) drug therapy: Secondary | ICD-10-CM

## 2013-10-09 DIAGNOSIS — I2510 Atherosclerotic heart disease of native coronary artery without angina pectoris: Secondary | ICD-10-CM | POA: Diagnosis present

## 2013-10-09 DIAGNOSIS — Z87891 Personal history of nicotine dependence: Secondary | ICD-10-CM

## 2013-10-09 DIAGNOSIS — R55 Syncope and collapse: Secondary | ICD-10-CM | POA: Diagnosis present

## 2013-10-09 DIAGNOSIS — I493 Ventricular premature depolarization: Secondary | ICD-10-CM

## 2013-10-09 DIAGNOSIS — I447 Left bundle-branch block, unspecified: Secondary | ICD-10-CM | POA: Diagnosis present

## 2013-10-09 DIAGNOSIS — R7989 Other specified abnormal findings of blood chemistry: Secondary | ICD-10-CM | POA: Diagnosis not present

## 2013-10-09 DIAGNOSIS — I428 Other cardiomyopathies: Secondary | ICD-10-CM | POA: Diagnosis present

## 2013-10-09 DIAGNOSIS — F1010 Alcohol abuse, uncomplicated: Secondary | ICD-10-CM

## 2013-10-09 DIAGNOSIS — Z86711 Personal history of pulmonary embolism: Secondary | ICD-10-CM

## 2013-10-09 DIAGNOSIS — E876 Hypokalemia: Secondary | ICD-10-CM | POA: Diagnosis present

## 2013-10-09 DIAGNOSIS — I10 Essential (primary) hypertension: Secondary | ICD-10-CM | POA: Diagnosis present

## 2013-10-09 DIAGNOSIS — I252 Old myocardial infarction: Secondary | ICD-10-CM | POA: Diagnosis present

## 2013-10-09 DIAGNOSIS — I4729 Other ventricular tachycardia: Secondary | ICD-10-CM

## 2013-10-09 DIAGNOSIS — I259 Chronic ischemic heart disease, unspecified: Secondary | ICD-10-CM | POA: Diagnosis present

## 2013-10-09 DIAGNOSIS — I724 Aneurysm of artery of lower extremity: Secondary | ICD-10-CM | POA: Diagnosis present

## 2013-10-09 DIAGNOSIS — I214 Non-ST elevation (NSTEMI) myocardial infarction: Principal | ICD-10-CM | POA: Diagnosis present

## 2013-10-09 DIAGNOSIS — I739 Peripheral vascular disease, unspecified: Secondary | ICD-10-CM | POA: Diagnosis present

## 2013-10-09 DIAGNOSIS — Z8551 Personal history of malignant neoplasm of bladder: Secondary | ICD-10-CM

## 2013-10-09 DIAGNOSIS — E785 Hyperlipidemia, unspecified: Secondary | ICD-10-CM | POA: Diagnosis present

## 2013-10-09 DIAGNOSIS — I2699 Other pulmonary embolism without acute cor pulmonale: Secondary | ICD-10-CM

## 2013-10-09 HISTORY — DX: Depression, unspecified: F32.A

## 2013-10-09 HISTORY — DX: Major depressive disorder, single episode, unspecified: F32.9

## 2013-10-09 LAB — CBC
HCT: 34.3 % — ABNORMAL LOW (ref 39.0–52.0)
Hemoglobin: 11.1 g/dL — ABNORMAL LOW (ref 13.0–17.0)
MCH: 30.2 pg (ref 26.0–34.0)
MCHC: 32.4 g/dL (ref 30.0–36.0)
MCV: 93.5 fL (ref 78.0–100.0)
Platelets: 207 10*3/uL (ref 150–400)
RBC: 3.67 MIL/uL — ABNORMAL LOW (ref 4.22–5.81)
RDW: 15.1 % (ref 11.5–15.5)
WBC: 5 10*3/uL (ref 4.0–10.5)

## 2013-10-09 LAB — HEPARIN LEVEL (UNFRACTIONATED): Heparin Unfractionated: 0.36 IU/mL (ref 0.30–0.70)

## 2013-10-09 LAB — BASIC METABOLIC PANEL
BUN: 16 mg/dL (ref 6–23)
CO2: 24 mEq/L (ref 19–32)
Calcium: 9.8 mg/dL (ref 8.4–10.5)
Chloride: 107 mEq/L (ref 96–112)
Creatinine, Ser: 1.01 mg/dL (ref 0.50–1.35)
GFR calc Af Amer: 73 mL/min — ABNORMAL LOW (ref >90)
GFR calc non Af Amer: 63 mL/min — ABNORMAL LOW (ref >90)
Glucose, Bld: 80 mg/dL (ref 70–99)
Potassium: 3.4 mEq/L — ABNORMAL LOW (ref 3.7–5.3)
Sodium: 147 mEq/L (ref 137–147)

## 2013-10-09 LAB — TROPONIN I
Troponin I: 0.98 ng/mL (ref <0.30)
Troponin I: 0.39 ng/mL (ref <0.30)

## 2013-10-09 LAB — PROTIME-INR
INR: 1.41 (ref 0.00–1.49)
Prothrombin Time: 17.3 seconds — ABNORMAL HIGH (ref 11.6–15.2)

## 2013-10-09 LAB — MRSA PCR SCREENING: MRSA by PCR: POSITIVE — AB

## 2013-10-09 MED ORDER — HEPARIN (PORCINE) IN NACL 100-0.45 UNIT/ML-% IJ SOLN
800.0000 [IU]/h | INTRAMUSCULAR | Status: DC
  Administered 2013-10-09: 08:00:00 800 [IU]/h via INTRAVENOUS
  Filled 2013-10-09: qty 250

## 2013-10-09 MED ORDER — HYDRALAZINE HCL 20 MG/ML IJ SOLN
10.0000 mg | Freq: Four times a day (QID) | INTRAMUSCULAR | Status: DC | PRN
  Administered 2013-10-09: 05:00:00 10 mg via INTRAVENOUS
  Filled 2013-10-09: qty 1

## 2013-10-09 MED ORDER — LISINOPRIL 20 MG PO TABS
20.0000 mg | ORAL_TABLET | Freq: Every day | ORAL | Status: DC
  Administered 2013-10-09 – 2013-10-11 (×3): 20 mg via ORAL
  Filled 2013-10-09 (×3): qty 1

## 2013-10-09 MED ORDER — SIMVASTATIN 20 MG PO TABS
20.0000 mg | ORAL_TABLET | Freq: Every day | ORAL | Status: DC
  Administered 2013-10-09 – 2013-10-10 (×2): 20 mg via ORAL
  Filled 2013-10-09 (×3): qty 1

## 2013-10-09 MED ORDER — MEGESTROL ACETATE 20 MG PO TABS
20.0000 mg | ORAL_TABLET | Freq: Every day | ORAL | Status: DC
  Administered 2013-10-09 – 2013-10-11 (×3): 20 mg via ORAL
  Filled 2013-10-09 (×3): qty 1

## 2013-10-09 MED ORDER — HEPARIN SODIUM (PORCINE) 5000 UNIT/ML IJ SOLN
5000.0000 [IU] | Freq: Three times a day (TID) | INTRAMUSCULAR | Status: DC
  Administered 2013-10-09 – 2013-10-11 (×6): 5000 [IU] via SUBCUTANEOUS
  Filled 2013-10-09 (×8): qty 1

## 2013-10-09 MED ORDER — ISOSORBIDE MONONITRATE ER 30 MG PO TB24
30.0000 mg | ORAL_TABLET | Freq: Every day | ORAL | Status: DC
  Administered 2013-10-09 – 2013-10-11 (×3): 30 mg via ORAL
  Filled 2013-10-09 (×3): qty 1

## 2013-10-09 MED ORDER — POTASSIUM CHLORIDE CRYS ER 20 MEQ PO TBCR: 40.0000 meq | EXTENDED_RELEASE_TABLET | Freq: Once | ORAL | Status: DC

## 2013-10-09 MED ORDER — FEBUXOSTAT 40 MG PO TABS
40.0000 mg | ORAL_TABLET | Freq: Every day | ORAL | Status: DC
  Administered 2013-10-09 – 2013-10-11 (×3): 40 mg via ORAL
  Filled 2013-10-09 (×3): qty 1

## 2013-10-09 MED ORDER — ASPIRIN EC 81 MG PO TBEC
81.0000 mg | DELAYED_RELEASE_TABLET | Freq: Every day | ORAL | Status: DC
  Administered 2013-10-10 – 2013-10-11 (×2): 81 mg via ORAL
  Filled 2013-10-09 (×2): qty 1

## 2013-10-09 MED ORDER — HYDRALAZINE HCL 20 MG/ML IJ SOLN: 10.0000 mg | INTRAMUSCULAR | Status: DC | PRN

## 2013-10-09 MED ORDER — HEPARIN (PORCINE) IN NACL 100-0.45 UNIT/ML-% IJ SOLN
800.0000 [IU]/h | INTRAMUSCULAR | Status: DC
  Filled 2013-10-09: qty 250

## 2013-10-09 MED ORDER — ACETAMINOPHEN 325 MG PO TABS
650.0000 mg | ORAL_TABLET | ORAL | Status: DC | PRN
  Administered 2013-10-09: 22:00:00 650 mg via ORAL
  Filled 2013-10-09: qty 2

## 2013-10-09 MED ORDER — ONDANSETRON HCL 4 MG/2ML IJ SOLN: 4.0000 mg | Freq: Four times a day (QID) | INTRAMUSCULAR | Status: DC | PRN

## 2013-10-09 MED ORDER — DONEPEZIL HCL 5 MG PO TABS
5.0000 mg | ORAL_TABLET | Freq: Every day | ORAL | Status: DC
  Administered 2013-10-09 – 2013-10-10 (×2): 5 mg via ORAL
  Filled 2013-10-09 (×3): qty 1

## 2013-10-09 MED ORDER — SPIRONOLACTONE 25 MG PO TABS
25.0000 mg | ORAL_TABLET | Freq: Every day | ORAL | Status: DC
  Administered 2013-10-09 – 2013-10-11 (×3): 25 mg via ORAL
  Filled 2013-10-09 (×3): qty 1

## 2013-10-09 MED ORDER — METOPROLOL SUCCINATE ER 50 MG PO TB24
50.0000 mg | ORAL_TABLET | Freq: Two times a day (BID) | ORAL | Status: DC
  Administered 2013-10-09 – 2013-10-11 (×6): 50 mg via ORAL
  Filled 2013-10-09 (×7): qty 1

## 2013-10-09 MED ORDER — POTASSIUM CHLORIDE CRYS ER 20 MEQ PO TBCR
40.0000 meq | EXTENDED_RELEASE_TABLET | Freq: Every day | ORAL | Status: DC
  Administered 2013-10-09 – 2013-10-11 (×3): 40 meq via ORAL
  Filled 2013-10-09 (×3): qty 2

## 2013-10-09 MED ORDER — FUROSEMIDE 10 MG/ML IJ SOLN
40.0000 mg | Freq: Two times a day (BID) | INTRAMUSCULAR | Status: DC
  Administered 2013-10-09 – 2013-10-11 (×6): 40 mg via INTRAVENOUS
  Filled 2013-10-09 (×7): qty 4

## 2013-10-09 MED ORDER — HYDRALAZINE HCL 10 MG PO TABS
10.0000 mg | ORAL_TABLET | Freq: Three times a day (TID) | ORAL | Status: DC
  Administered 2013-10-09 – 2013-10-10 (×3): 10 mg via ORAL
  Filled 2013-10-09 (×6): qty 1

## 2013-10-09 MED ORDER — MUPIROCIN 2 % EX OINT
1.0000 "application " | TOPICAL_OINTMENT | Freq: Two times a day (BID) | CUTANEOUS | Status: DC
  Administered 2013-10-09 – 2013-10-11 (×6): 1 via NASAL
  Filled 2013-10-09 (×2): qty 22

## 2013-10-09 MED ORDER — CHLORHEXIDINE GLUCONATE CLOTH 2 % EX PADS
6.0000 | MEDICATED_PAD | Freq: Every day | CUTANEOUS | Status: DC
  Administered 2013-10-10 – 2013-10-11 (×2): 6 via TOPICAL

## 2013-10-09 NOTE — H&P (Addendum)
Cardiology Consultation Note  Patient ID: Scott [name], MRN: [id number], DOB/AGE: [birth year] 78 y.o. Admit date: 10/09/2013   Date of Consult: 10/09/2013 Primary Physician: Redge Gainer, MD Primary Cardiologist: Dr Caryl Comes    Ass -Elevated Troponin - pt with recent eval with cath normal , suspect this is demand related  -CHF - Class III, acute on chronic systolic  - HTN - uncontrolled  - falls/ presyncope  - AMS ? Delirium on underlying dementia   Plan  -Will trend CE  -Start heparin for now per pharmacy consult  -cont aspirin , statin , aldactone , toprol , lisinopril  - add Hydralazine prn for SBP > 160  - start lasix 40IV BID , check renal panel BID  - hold lyrica and narcotic to improve level of alertness/ AMS       Chief Complaint: SOB  80y.o.male with NICM (06/2012 cath with patent cors - EF 40% then, more recently 20-25% not thought to be an ICD candidate due to age, persistent alcohol usage and cognitive decline by Dr Rayann Heman), HTN, chronic LBBB, postural syncope related to diuretics, EtOH abuse and recent DVT who is transferred from [facility] for management of SOB and elevated troponin.    HPI: pt currently with eye closed lying in bed unable to answer my question in details. Most of the history obtained from son at bedside and daughter in law who is a physician over the phone. Pt aparrently had increased SOB for the past 2 weeks with Le edema and orthopnea and dry cough. He has been a heavy alcohol drinker , however has not taken any in the past 2 weeks. Pt has had dizziness and associated presyncopal symptoms and falls ( at least one in the past week). He has denies and overt chest pain . Compliance with medication has been an issue and family is not sure if he has been taking his medications as instructed.  At [facility] hospital pt EKG showed his NSR, LBBB and PVC's . Chest Xray  Congestion. BNP >5000. TropI 1.1, Cr 1.11, Hg 11.4. His Initial BP was 180/110, HR  97bpm, RR 29 . He was given lovenox [place] , Aspirin , Metoprolol 5mg  IV X1, lasix 40 IV X1 with 400 cc output and Hydralazine 50mg  PO X1.     Past Medical History  Diagnosis Date  . HTN (hypertension)   . Hyperlipidemia   . PVCs (premature ventricular contractions)   . Glaucoma   . PVD (peripheral vascular disease)     Right ABI .75, Left .78 (2006)  . Popliteal artery aneurysm, bilateral DECUMENTED CHRONIC PARTIAL OCCLUSION--  PT DENIES CLAUDICATION OR ANY OTHER SYMPTOMS  . Frequency of urination   . Nocturia   . Bladder cancer 03/15/2012  . Chronic systolic CHF (congestive heart failure)     a. NICM - patent cors 06/2012, EF 40% at that time. b. 03/2013 eval: EF 20-25%.  . DDD (degenerative disc disease)   . Arthritis   . DVT, bilateral lower limbs 03/2013    a. Bilat DVT dx 03/2013.  . Pulmonary embolism     a. By CT angio 04/2013.  Marland Kitchen Elevated troponin     a. Adm 12/30-04/2013 - not felt to represent ACS; ?due to PE. b. Patent cors 06/2012.  Marland Kitchen NSVT (nonsustained ventricular tachycardia)     a. NSVT 06/2012; NSVT also seen during 03/2013 adm. b. Med rx. Not candidate for ICD given adv age.  Marland Kitchen ETOH abuse   . Syncope  a. Felt to be postural syncope related to diuretics 06/2012.      Most Recent Cardiac Studies: 04/12/2013 echo  - Limited study. Recent complete study on December 4 noted. Moderate LVH with LVEF approximately 20-25% (measurement of LVEF by border tracking does not appear accurate). Diastolic function not evaluated. Diffuse hypokinesis with regional variation, worse inferolateral wall. Other findings as noted above. Probable Lambls excrescence seen at noncoronary leaflet tip - less likely small vegetation. No color Doppler data provided for aortic valve. Other findings as outlined.     Surgical History:  Past Surgical History  Procedure Laterality Date  . Transthoracic echocardiogram  09-22-2010    MODERATE LVH/ EF 65%/ GRADE I DIASTOLIC DYSFUNCTION/ AORTIC  SCLEROSIS WITHOUT STENOSIS/  RV  SYSTOLIC  MILDLY REDUCED FUNCTION  . Cardiovascular stress test  10-15-2010    LOW RISK NUCLEAR STUDY/ NO EVIDENCE OF ISCHEMIA/ NORMAL EF  . Shoulder surgery  1970's    "don't remember which side or what kind of OR"  . Transurethral resection of bladder tumor  10/07/2011    Procedure: TRANSURETHRAL RESECTION OF BLADDER TUMOR (TURBT);  Surgeon: Malka So, MD;  Location[facility];  Service: Urology;  Laterality: N/A;  . Cystoscopy  10/07/2011    Procedure: CYSTOSCOPY;  Surgeon: Malka So, MD;  Location: [facility];  Service: Urology;  Laterality: N/A;  . Cystoscopy with biopsy  03/14/2012    Procedure: CYSTOSCOPY WITH BIOPSY;  Surgeon: Malka So, MD;  Location: [facility];  Service: Urology;  Laterality: N/A;  WITH FULGURATION  . Transurethral resection of prostate  03/14/2012    Procedure: TRANSURETHRAL RESECTION OF THE PROSTATE WITH GYRUS INSTRUMENTS;  Surgeon: Malka So, MD;  Location: [facility];  Service: Urology;  Laterality: N/A;  . Tonsillectomy    . Cataract extraction w/ intraocular lens  implant, bilateral Bilateral   . Cardiac catheterization  05-11-2004  DR [place] PLAQUE/ NORMAL LVF/ EF 55%/  NON-OBSTRUCTIVE LAD 25%  . Cardiac catheterization  06/2012     Home Meds: Prior to Admission medications   Medication Sig Start Date End Date Taking? Authorizing Provider  donepezil (ARICEPT) 10 MG tablet Take 5 mg by mouth at bedtime. 05/18/13   Chipper Herb, MD  febuxostat (ULORIC) 40 MG tablet Take 1 tablet (40 mg total) by mouth daily. 08/02/13   Chipper Herb, MD  furosemide (LASIX) 20 MG tablet TAKE 2 EXTRA TABLETS FOR 3 DAYS AND RETURN TO REGULAR DOSE 20 MG DAILY 09/10/13   Imogene Burn, PA-C  gabapentin (NEURONTIN) 100 MG capsule TAKE 2 CAPSULES AT BEDTIME    Chipper Herb, MD  HYDROcodone-acetaminophen [facility]) 10-325 MG per tablet Take 1 tablet by mouth every 6 (six) hours as needed for  moderate pain.  10/17/12   Historical Provider, MD  lisinopril (PRINIVIL,ZESTRIL) 20 MG tablet TAKE 1 TABLET ONCE A DAY    Chipper Herb, MD  megestrol (MEGACE) 20 MG tablet TAKE 1 TABLET ONCE A DAY    Chipper Herb, MD  metoprolol succinate (TOPROL-XL) 50 MG 24 hr tablet Take 1 tablet (50 mg total) by mouth 2 (two) times daily. 09/10/13   Imogene Burn, PA-C  nitroGLYCERIN (NITROSTAT) 0.4 MG SL tablet Place 1 tablet (0.4 mg total) under the tongue every 5 (five) minutes as needed for chest pain (up to 3 doses). 04/19/13   Dayna N Dunn, PA-C  simvastatin (ZOCOR) 10 MG tablet Take 10  mg by mouth every morning.    Historical Provider, MD  simvastatin (ZOCOR) 10 MG tablet Take 1 tablet (10 mg total) by mouth daily.    Chipper Herb, MD  spironolactone (ALDACTONE) 25 MG tablet Take 25 mg by mouth every morning.    Historical Provider, MD  spironolactone (ALDACTONE) 25 MG tablet Take 1 tablet (25 mg total) by mouth daily.    Chipper Herb, MD  warfarin (COUMADIN) 2.5 MG tablet Take 2.5 mg by mouth every evening.    Historical Provider, MD    Inpatient Medications:  . Chlorhexidine Gluconate Cloth  6 each Topical Q0600  . mupirocin ointment  1 application Nasal BID      Allergies:  Allergies  Allergen Reactions  . Lipitor [Atorvastatin] Other (See Comments)    unknown    History   Social History  . Marital Status: Married    Spouse Name: N/A    Number of Children: 1  . Years of Education: N/A   Occupational History  . Retired    Social History Main Topics  . Smoking status: Former Smoker -- 1.00 packs/day for 20 years    Types: Cigarettes    Start date: 04/12/1942    Quit date: 04/13/1975  . Smokeless tobacco: Never Used  . Alcohol Use: 0.0 oz/week     Comment: 04/10/2013 "trying to quit alcohol; don't know how much I drink; drink whenever I feel like it"  . Drug Use: No  . Sexual Activity: No   Other Topics Concern  . Not on file   Social History Narrative   Lives in  [place] with spouse who is s/p spinal cord injury.  He has one grown son who lives in [place].  He is retired but worked for [employer] for years and lives in [place].     Family History  Problem Relation Age of Onset  . Hypertension Mother   . Lung cancer Sister      Review of Systems: Pt unable to answer question related to this due to fatigue and wanting to sleep    Labs: No results found for this basename: CKTOTAL, CKMB, TROPONINI,  in the last 72 hours Lab Results  Component Value Date   WBC 3.0* 07/20/2013   HGB 12.6* 07/20/2013   HCT 38.9* 07/20/2013   MCV 83.9 07/20/2013   PLT 120* 07/10/2013   No results found for this basename: NA, K, CL, CO2, BUN, CREATININE, CALCIUM, LABALBU, PROT, BILITOT, ALKPHOS, ALT, AST, GLUCOSE,  in the last 168 hours Lab Results  Component Value Date   CHOL 107 05/17/2013   HDL 50 05/17/2013   LDLCALC 42 05/17/2013   TRIG 77 05/17/2013   No results found for this basename: DDIMER    Radiology/Studies:  No results found.    Physical Exam: Blood pressure 162/87, pulse 93, temperature 98.5 F (36.9 C), temperature source Oral, resp. rate 20, height 5\' 6"  (1.676 m), weight 72.1 kg (158 lb 15.2 oz), SpO2 98.00%. General: elderly frail , lying in bed Neck: Negative for carotid bruits. +JVD Lungs: bibasilar crackles  Heart: RRR with S1 S2. 2/6 systolic mumur  Abdomen: Soft, non-tender, non-distended with normoactive bowel sounds. No hepatomegaly. No rebound/guarding. No obvious abdominal masses. Msk:  Strength and tone appear normal for age. Extremities: No clubbing or cyanosis. 2+ pitting edema upto knees . Warm to touch  Neuro: not alert, oriented to place and person . Moving all 4 ext     Assessment and Plan:  Cory Roughen, A M.D 10/09/2013, 2:25 AM

## 2013-10-09 NOTE — Care Management Note (Addendum)
  Page 2 of 2   10/12/2013     8:36:14 AM CARE MANAGEMENT NOTE 10/12/2013  Patient:  JARRY, MANON   Account Number:  [id number]  Date Initiated:  10/09/2013  Documentation initiated by:  Hamza Empson  Subjective/Objective Assessment:   CHF, ACS     Action/Plan:   CM to follow for dispositon needs   Anticipated DC Date:  10/12/2013   Anticipated DC Plan:  [place]  CM consult      Choice offered to / List presented to:  C-1 Patient        [place] arranged  HH-1 RN  [place]      [place].   Status of service:  Completed, signed off Medicare Important Message given?   (If response is "NO", the following Medicare IM given date fields will be blank) Date Medicare IM given:   Medicare IM given by:   Date Additional Medicare IM given:   Additional Medicare IM given by:    Discharge Disposition:  [place]  Per UR Regulation:  Reviewed for med. necessity/level of care/duration of stay  If discussed at [place] of Stay Meetings, dates discussed:    Comments:  Abron Neddo RN, BSN, MSHL, CCM  Nurse - Case Manager, (Unit [phone]  10/11/2013 Initial IM 10/11/2013 by admissions - no additional IM needed. PT RECS:  PT and RW  (RW ordered with [facility] to be delivered to room prior to d/c) CM  spoke with son/POA/David at [phone] and updated him on patients refusal for [facility] services; explained reasons that patient should engage and purpose of maxing resource services to remain and receive care in the home as long as possible.  Sitter in the home but has been used for wife who is also sick in the home. [name] agreed and will calll his DAD to explain again. Expressed concerns about intermittent transportation;  some of the sitters on certain days does not drive. Son aware of planned d/c for Friday and he will make arrangements THN:  Corliss Blacker  notified CM contacted MD and agreed patient ok to d/c this afternoorn.  CM notified son and transportation arrangements arranged by son. Disposition:  Home with HHS:  RN, PT and possible [facility] services.   Victoria Euceda RN, BSN, MSHL, CCM  Nurse - Case Manager, (Unit [phone]  10/09/2013 Hx/o med non-compliance and falls From home with wife.  Patient confirms wife receives HHS [facility]) in the home but he has not been active with any services.   Patient states he elects to stay with [facility] to keep things simple if he needs services at d/c. CM contacted [facility]/Stephanie and confirmed wife/Geraldine is active with [facility] services. Home DME:  cane PT RECS:  ________pending Dispositon Plan:  pending  (HHS v SNF) Disease MGMT Med MGMT Fall Prevention

## 2013-10-09 NOTE — Progress Notes (Signed)
See alternate note  

## 2013-10-09 NOTE — Progress Notes (Addendum)
Patient Name: Scott [name] Date of Encounter: 10/09/2013  Principal Problem:   CHF (congestive heart failure), NYHA class III Active Problems:   NSTEMI (non-ST elevated myocardial infarction)   HTN (hypertension)    Patient Profile: 80 yo male w/ hx cath 06/2012 w/ luminal irreg and EF 45-50%, EF 20-25% by echo 04/2013, chronic S-CHF, hx syncope, possibly orthostatic/postural and NSVT but no ICD due to ongoing heavy ETOH, progressive cognitive decline, and advanced age. Hx DVT & PE on coumadin, ETOH use w/ DUI in 2015.   SUBJECTIVE: Pt denies chest pain, SOB is better, wheezes occasionally at home. Oriented to name only. Says did not take his medicine day before yesterday.  OBJECTIVE Filed Vitals:   10/09/13 0422 10/09/13 0447 10/09/13 0738 10/09/13 0800  BP: 179/119 164/115 176/124   Pulse: 91 95 89 87  Temp: 98 F (36.7 C)  98.3 F (36.8 C)   TempSrc: Oral  Oral   Resp: 17 20 18 18   Height:      Weight:      SpO2: 100% 100% 100% 100%    Intake/Output Summary (Last 24 hours) at 10/09/13 1121 Last data filed at 10/09/13 0739  Gross per 24 hour  Intake      0 ml  Output   3000 ml  Net  -3000 ml   Filed Weights   10/09/13 0033  Weight: 158 lb 15.2 oz (72.1 kg)    PHYSICAL EXAM General: Well developed, well nourished, male in no acute distress. Head: Normocephalic, atraumatic.  Neck: Supple without bruits, JVD at 12 cm. Lungs:  Resp regular and unlabored, + exp wheeze, few rhonchi, basilar rales. Heart: RRR, S1, S2, no S3, S4, or murmur; no rub. Abdomen: Soft, non-tender, non-distended, BS + x 4.  Extremities: No clubbing, cyanosis, 1+ edema.  Neuro: Alert and oriented X 1. Knows name, knows he is not at home, not sure where he is. Moves all extremities spontaneously.  LABS: CBC:  Recent Labs  10/09/13 0810  WBC 5.0  HGB 11.1*  HCT 34.3*  MCV 93.5  PLT 207   INR:  Recent Labs  10/09/13 0810  INR 4.40   Basic Metabolic Panel:  Recent Labs  10/09/13 0810  NA 147  K 3.4*  CL 107  CO2 24  GLUCOSE 80  BUN 16  CREATININE 1.01  CALCIUM 9.8   Cardiac Enzymes:  Recent Labs  10/09/13 0810  TROPONINI 0.98*   BNP: Pro B Natriuretic peptide (BNP)  Date/Time Value Ref Range Status  04/10/2013  2:11 PM 10535.0* 0 - 450 pg/mL Final  03/14/2013  4:55 PM 41893.0* 0 - 450 pg/mL Final    TELE:  SR, variable P waves and PR interval, see printed telemetry strips. ?WAP       Current Medications:  . [START ON 10/10/2013] aspirin EC  81 mg Oral Daily  . Chlorhexidine Gluconate Cloth  6 each Topical Q0600  . donepezil  5 mg Oral QHS  . febuxostat  40 mg Oral Daily  . furosemide  40 mg Intravenous BID  . lisinopril  20 mg Oral Daily  . megestrol  20 mg Oral Daily  . metoprolol succinate  50 mg Oral BID  . mupirocin ointment  1 application Nasal BID  . simvastatin  20 mg Oral q1800  . spironolactone  25 mg Oral Daily   . heparin 800 Units/hr (10/09/13 0800)    ASSESSMENT AND PLAN: Principal Problem:   CHF (congestive heart failure),  NYHA class III - Resp status good 06/05 at [facility] With Dr. Rayann Heman. Weight then was 149 lbs. Currently 158 lbs. Still with volume overload. Continue diuresis, follow I/O, weights, electrolytes.   Active Problems:   NSTEMI (non-ST elevated myocardial infarction) - possibly secondary to CHF, enzymes trending down (troponin > 1 at [facility]), too SOB for cath today. Continue ASA, BB, ACE, statin. MD advise timing of ischemic eval.    HTN (hypertension) - concerns for compliance with meds PTA. MD advise on changing to Coreg for better BP control. Currently on IV Lasix 40 mg BID, lisinopril 20 mg qd, Toprol XL 50 mg qd, spironolactone 25 mg. Will add PRN hydralazine, plus scheduled hydralazine 10 mg TID and Imdur 30 mg daily.    NSVT - short runs seen on telemetry w/ multi-focal PVCs. On BB, asymptomatic, follow.    Hypokalemia - supplement and follow   Signed, Rosaria Ferries , PA-C 11:21  AM 10/09/2013 Patient seen and examined  I have amended note above to reflect my findings. Denies  CP  Evid of volume increase on exam.  Continue duresis. I would follow enzymes but most like due to demand in setting of CHF   I would stop heparin  WOuld not plan of invaasive eval.    Dorris Carnes

## 2013-10-09 NOTE — Progress Notes (Signed)
CRITICAL VALUE ALERT  Critical value received:  Troponin 0.98  Date of notification:  10/09/13   Time of notification:  0918  Critical value read back:Yes.    Nurse who received alert:  Chaney Born RN  MD notified (1st page):  Rosaria Ferries PA  Time of first page:  [phone]  MD notified (2nd page):  Time of second page:  Responding MD:  Rosaria Ferries PA  Time MD responded:  [phone]

## 2013-10-09 NOTE — Progress Notes (Signed)
UR completed Raji Glinski K. Lysandra Loughmiller, RN, BSN, MSHL, CCM  10/09/2013 11:30 AM

## 2013-10-09 NOTE — Progress Notes (Signed)
ANTICOAGULATION CONSULT NOTE - Initial Consult  Pharmacy Consult for Heparin  Indication: chest pain/ACS  Allergies  Allergen Reactions  . Lipitor [Atorvastatin] Other (See Comments)    unknown   Patient Measurements: Height: 5\' 6"  (167.6 cm) Weight: 158 lb 15.2 oz (72.1 kg) IBW/kg (Calculated) : 63.8  Vital Signs: Temp: 98.5 F (36.9 C) (06/30 0033) Temp src: Oral (06/30 0033) BP: 162/87 mmHg (06/30 0141) Pulse Rate: 93 (06/30 0141)  Labs at [facility] per RN:  Hgb 11.4 Scr 1.11  Medical History: Past Medical History  Diagnosis Date  . HTN (hypertension)   . Hyperlipidemia   . PVCs (premature ventricular contractions)   . Glaucoma   . PVD (peripheral vascular disease)     Right ABI .75, Left .78 (2006)  . Popliteal artery aneurysm, bilateral DECUMENTED CHRONIC PARTIAL OCCLUSION--  PT DENIES CLAUDICATION OR ANY OTHER SYMPTOMS  . Frequency of urination   . Nocturia   . Bladder cancer 03/15/2012  . Chronic systolic CHF (congestive heart failure)     a. NICM - patent cors 06/2012, EF 40% at that time. b. 03/2013 eval: EF 20-25%.  . DDD (degenerative disc disease)   . Arthritis   . DVT, bilateral lower limbs 03/2013    a. Bilat DVT dx 03/2013.  . Pulmonary embolism     a. By CT angio 04/2013.  Marland Kitchen Elevated troponin     a. Adm 12/30-04/2013 - not felt to represent ACS; ?due to PE. b. Patent cors 06/2012.  Marland Kitchen NSVT (nonsustained ventricular tachycardia)     a. NSVT 06/2012; NSVT also seen during 03/2013 adm. b. Med rx. Not candidate for ICD given adv age.  Marland Kitchen ETOH abuse   . Syncope     a. Felt to be postural syncope related to diuretics 06/2012.   Assessment: 80 y/o M transfer from outside hospital to start heparin per pharmacy (MD requests no bolus). Pt received full dose Lovenox of 6/29 at 2034. Labs as above.   Goal of Therapy:  Heparin level 0.3-0.7 units/ml Monitor platelets by anticoagulation protocol: Yes   Plan:  -Start heparin drip at 800 units/hr at 0800 -1600  HL -Daily CBC/HL -Monitor for bleeding  Narda Bonds 10/09/2013,3:52 AM

## 2013-10-10 DIAGNOSIS — I4949 Other premature depolarization: Secondary | ICD-10-CM

## 2013-10-10 DIAGNOSIS — I472 Ventricular tachycardia: Secondary | ICD-10-CM

## 2013-10-10 DIAGNOSIS — I10 Essential (primary) hypertension: Secondary | ICD-10-CM

## 2013-10-10 DIAGNOSIS — I4729 Other ventricular tachycardia: Secondary | ICD-10-CM

## 2013-10-10 LAB — CBC
HCT: 32.5 % — ABNORMAL LOW (ref 39.0–52.0)
Hemoglobin: 10.7 g/dL — ABNORMAL LOW (ref 13.0–17.0)
MCH: 30.3 pg (ref 26.0–34.0)
MCHC: 32.9 g/dL (ref 30.0–36.0)
MCV: 92.1 fL (ref 78.0–100.0)
Platelets: 223 10*3/uL (ref 150–400)
RBC: 3.53 MIL/uL — ABNORMAL LOW (ref 4.22–5.81)
RDW: 15 % (ref 11.5–15.5)
WBC: 4.7 10*3/uL (ref 4.0–10.5)

## 2013-10-10 LAB — BASIC METABOLIC PANEL
BUN: 19 mg/dL (ref 6–23)
CO2: 24 mEq/L (ref 19–32)
Calcium: 9.6 mg/dL (ref 8.4–10.5)
Chloride: 105 mEq/L (ref 96–112)
Creatinine, Ser: 1.13 mg/dL (ref 0.50–1.35)
GFR calc Af Amer: 64 mL/min — ABNORMAL LOW (ref >90)
GFR calc non Af Amer: 55 mL/min — ABNORMAL LOW (ref >90)
Glucose, Bld: 101 mg/dL — ABNORMAL HIGH (ref 70–99)
Potassium: 4 mEq/L (ref 3.7–5.3)
Sodium: 143 mEq/L (ref 137–147)

## 2013-10-10 LAB — PROTIME-INR
INR: 1.34 (ref 0.00–1.49)
Prothrombin Time: 16.6 seconds — ABNORMAL HIGH (ref 11.6–15.2)

## 2013-10-10 MED ORDER — ENSURE COMPLETE PO LIQD
237.0000 mL | Freq: Two times a day (BID) | ORAL | Status: DC
  Administered 2013-10-10 – 2013-10-11 (×4): 237 mL via ORAL
  Filled 2013-10-10 (×6): qty 237

## 2013-10-10 MED ORDER — HYDRALAZINE HCL 25 MG PO TABS
25.0000 mg | ORAL_TABLET | Freq: Three times a day (TID) | ORAL | Status: DC
  Administered 2013-10-10 – 2013-10-11 (×4): 25 mg via ORAL
  Filled 2013-10-10 (×6): qty 1

## 2013-10-10 NOTE — Evaluation (Signed)
Physical Therapy Evaluation Patient Details Name: Scott [name] MRN: [id number] DOB: [birth year] Today's Date: 10/10/2013   History of Present Illness  80 yo male w/ hx cath 06/2012 w/ luminal irreg and EF 45-50%, EF 20-25% by echo 04/2013, chronic S-CHF, hx syncope, possibly orthostatic/postural and NSVT but no ICD due to ongoing heavy ETOH, progressive cognitive decline, and advanced age. Hx DVT & PE on coumadin, ETOH use w/ DUI in 2015.  Clinical Impression   Pt admitted with above, difficulty getting BP under control, but much improved today. Pt currently with functional limitations due to the deficits listed below (see PT Problem List).  Pt will benefit from skilled PT to increase their independence and safety with mobility to allow discharge to the venue listed below.       Follow Up Recommendations Home health PT;Supervision/Assistance - 24 hour    Equipment Recommendations  Rolling walker with 5" wheels (pt may already have)    Recommendations for Other Services       Precautions / Restrictions Precautions Precautions: Fall Precaution Comments: Fall risk greatly reduced with use of RW      Mobility  Bed Mobility                  Transfers Overall transfer level: Needs assistance Equipment used: Straight cane;Rolling walker (2 wheeled) Transfers: Sit to/from Stand Sit to Stand: Min assist         General transfer comment: Decr control with stand to sit despite cues for hand placemetn  Ambulation/Gait Ambulation/Gait assistance: Min guard Ambulation Distance (Feet): 200 Feet Assistive device: Straight cane;Rolling walker (2 wheeled) Gait Pattern/deviations: Step-through pattern     General Gait Details: Initiated waling with straight cane, but noted much mroe steadiness with bilateral UE support provided by [employer] Mobility    Modified Rankin (Stroke Patients Only)       Balance           Standing balance  support: Single extremity supported;Bilateral upper extremity supported Standing balance-Leahy Scale: Fair Standing balance comment: much more steady with bilateral UE support                             Pertinent Vitals/Pain VSS, See vitals flow sheet.     Home Living Family/patient expects to be discharged to:: Private residence Living Arrangements: Spouse/significant other Available Help at Discharge: Family;Personal care attendant (Wife is paraplegic and has 24 hour care.  Other family prn) Type of Home: House Home Access: [place]: One level Home Equipment: [place] - 2 wheels;Cane - single point      Prior Function Level of Independence: Independent with assistive device(s) (Uses cane)               Hand Dominance        Extremity/Trunk Assessment   Upper Extremity Assessment: Overall WFL for tasks assessed;Defer to OT evaluation           Lower Extremity Assessment: Generalized weakness         Communication   Communication: No difficulties  Cognition Arousal/Alertness: Awake/alert Behavior During Therapy: WFL for tasks assessed/performed Overall Cognitive Status: Within Functional Limits for tasks assessed (for simple mobility tasks today)                      General Comments  Exercises        Assessment/Plan    PT Assessment Patient needs continued PT services  PT Diagnosis Generalized weakness   PT Problem List Decreased strength;Decreased activity tolerance;Decreased balance;Decreased cognition;Decreased knowledge of use of DME;Decreased knowledge of precautions  PT Treatment Interventions DME instruction;Gait training;Functional mobility training;Therapeutic activities;Therapeutic exercise;Balance training;Patient/family education   PT Goals (Current goals can be found in the Care Plan section) Acute Rehab PT Goals Patient Stated Goal: home PT Goal Formulation: With patient Time For Goal  Achievement: 10/24/13 Potential to Achieve Goals: Good    Frequency Min 3X/week   Barriers to discharge   Concern re: pt's cognitive status and ability to keep medications straight    Co-evaluation               End of Session Equipment Utilized During Treatment: Gait belt Activity Tolerance: Patient tolerated treatment well Patient left: in chair;with call bell/phone within reach Nurse Communication: Mobility status         Time: 4174-0814 PT Time Calculation (min): 24 min   Charges:   PT Evaluation $Initial PT Evaluation Tier I: 1 Procedure PT Treatments $Gait Training: 8-22 mins   PT G Codes:          Quin Hoop 10/10/2013, 4:53 PM  Roney Marion, [place] Pager [phone] Office [phone]

## 2013-10-10 NOTE — Progress Notes (Signed)
80 yo male w/ hx cath 06/2012 w/ luminal irreg and EF 45-50%, EF 20-25% by echo 04/2013, chronic S-CHF, hx syncope, possibly orthostatic/postural and NSVT but no ICD due to ongoing heavy ETOH, progressive cognitive decline, and advanced age. Hx DVT & PE on coumadin, ETOH use w/ DUI in 2015.    Subjective:   Objective: Vital signs in last 24 hours: Temp:  [98.1 F (36.7 C)-99.3 F (37.4 C)] 98.6 F (37 C) (07/01 0838) Pulse Rate:  [73-91] 86 (07/01 0838) Resp:  [15-20] 18 (07/01 0838) BP: (104-180)/(57-112) 143/66 mmHg (07/01 0838) SpO2:  [100 %] 100 % (07/01 0838) Weight:  [158 lb 11.7 oz (72 kg)] 158 lb 11.7 oz (72 kg) (07/01 0010) Weight change: -3.5 oz (-0.1 kg)   Intake/Output from previous day: -1912 (total since admit -4112) wt -3 oz. 06/30 0701 - 07/01 0700 In: 188 [P.O.:100; I.V.:88] Out: 2100 [Urine:2100] Intake/Output this shift: Total I/O In: 120 [P.O.:120] Out: -   PE: did not examine- MD to see [place] in NAD Neck:  INcreased JVP Lungs:  CTA Cardiac  RRR  No signif murmurs  Ext:  1-2+ edema Lab Results:  Recent Labs  10/09/13 0810 10/10/13 0525  WBC 5.0 4.7  HGB 11.1* 10.7*  HCT 34.3* 32.5*  PLT 207 223   BMET  Recent Labs  10/09/13 0810 10/10/13 0525  NA 147 143  K 3.4* 4.0  CL 107 105  CO2 24 24  GLUCOSE 80 101*  BUN 16 19  CREATININE 1.01 1.13  CALCIUM 9.8 9.6    Recent Labs  10/09/13 0810 10/09/13 1530  TROPONINI 0.98* 0.39*    Lab Results  Component Value Date   CHOL 107 05/17/2013   HDL 50 05/17/2013   LDLCALC 42 05/17/2013   TRIG 77 05/17/2013   CHOLHDL 3.4 02/12/2013   Lab Results  Component Value Date   HGBA1C 5.6 12/25/2010     Lab Results  Component Value Date   TSH 1.192 03/15/2013      Studies/Results: No results found.  Medications: I have reviewed the patient's current medications. Scheduled Meds: . aspirin EC  81 mg Oral Daily  . Chlorhexidine Gluconate Cloth  6 each Topical Q0600  . donepezil   5 mg Oral QHS  . febuxostat  40 mg Oral Daily  . furosemide  40 mg Intravenous BID  . heparin subcutaneous  5,000 Units Subcutaneous 3 times per day  . hydrALAZINE  10 mg Oral 3 times per day  . isosorbide mononitrate  30 mg Oral Daily  . lisinopril  20 mg Oral Daily  . megestrol  20 mg Oral Daily  . metoprolol succinate  50 mg Oral BID  . mupirocin ointment  1 application Nasal BID  . potassium chloride  40 mEq Oral Daily  . simvastatin  20 mg Oral q1800  . spironolactone  25 mg Oral Daily   Continuous Infusions:  PRN Meds:.acetaminophen, hydrALAZINE, hydrALAZINE, ondansetron (ZOFRAN) IV  Assessment/Plan: Principal Problem:   CHF (congestive heart failure), NYHA class III, systolic HF -4L but not much change in wt. On IV lasix 40 BID  Diuresing   Active Problems:   CAD- non obstructive disease by cath 3/14   HTN (hypertension)- still elevated-  Currently on IV Lasix 40 mg BID, lisinopril 20 mg qd, Toprol XL 50 mg qd, spironolactone 25 mg. Added hydralazine prn yesterday, plus scheduled hydralazine 10 mg TID and Imdur 30 mg daily, increase hydralazine to 25 mg  TID    NICM (nonischemic cardiomyopathy)- EF 20-25% by Echo 04/12/13   Chronic anticoagulation- subtheraputic INR on admission- coumadin stopped   NSTEMI (non-ST elevated myocardial infarction) type 2   Minimal elevation.  Due to demand ischemia in setting of CHF   Recent DVT/ small pul emboli 03/2013- INR sub therapeutic as outpt.   NSVT- recent holter monitor with 40 beat run of V Tach -also with 2-3 episodes of syncope per week.  Seen by Dr. Rayann Heman not a candidate for ICD.     Pt has been told not to drive.    LOS: 1 day   Time spent with pt. :15 minutes. [facility] R  Nurse Practitioner Certified Pager [phone] or after 5pm and on weekends call [phone] 10/10/2013, 10:46 AM  Patient seen and examined  Agree with findings as noted by L Ingold above.  I have amended note to reflect my findings.   Continue diuresis.     Scott [name]

## 2013-10-10 NOTE — Progress Notes (Signed)
INITIAL NUTRITION ASSESSMENT  DOCUMENTATION CODES Per approved criteria  -Severe malnutrition in the context of chronic illness   INTERVENTION: Ensure Complete po BID, each supplement provides 350 kcal and 13 grams of protein  NUTRITION DIAGNOSIS: Malnutrition related to chronic illness as evidenced by severe fat and muscle depletion.  If Megace being provided as an appetite stimulant recommend adjust dose to 400 mg BID.    Goal: Pt to meet >/= 90% of their estimated nutrition needs   Monitor:  PO intake, weight trend, labs   Reason for Assessment: Pt identified as at nutrition risk on the Malnutrition Screen Tool  80 y.o. male  Admitting Dx: CHF (congestive heart failure), NYHA class III  ASSESSMENT: Pt admitted from outside hospital with elevated troponin, per MD pt with recent cath which was normal, demand expected.  Pt with hx of ETOH abuse, had DUI in 2015 per MD notes.  Low potassium on admission, being replaced.   Per pt his weight loss began months ago, pt is non-specific. Pt does not weigh himself therefore his usual weight is unknown. Per pt he has had to buy smaller pants and shirts. States he knows he has lost weight. Per pt he lives with his wife who is paraplegic, she has 24/7 care. Her caregivers do most of the cooking, however per pt they are not good cooks. Pt eats 2 meals per day. Breakfast: bacon and eggs with coffee, Dinner: meat, starch, and vegetable. Per pt he has not drank ETOH in the last three months. Pt not specific about ETOH intake before this, states that he would drink when he wanted a drink.  Pt drinks ensure/boost occasionally, not on a consistent basis. Pt is willing to drink some now. Pt ate well at Breakfast 95% this am.   Nutrition Focused Physical Exam:  Subcutaneous Fat:  Orbital Region: WNL Upper Arm Region: severe depletion  Thoracic and Lumbar Region: severe depletion   Muscle:  Temple Region: mild/moderate depletion  Clavicle Bone  Region: mild/moderate depletion  Clavicle and Acromion Bone Region: severe depletion Scapular Bone Region: mild/moderate depletion  Dorsal Hand: severe depletion  Patellar Region: severe depletion  Anterior Thigh Region: severe depletion  Posterior Calf Region: severe depletion   Edema: present in ankles/feet    Height: Ht Readings from Last 1 Encounters:  10/09/13 5\' 6"  (1.676 m)    Weight: Wt Readings from Last 1 Encounters:  10/10/13 158 lb 11.7 oz (72 kg)    Ideal Body Weight: 64.5 kg   % Ideal Body Weight: 112%  Wt Readings from Last 10 Encounters:  10/10/13 158 lb 11.7 oz (72 kg)  09/14/13 149 lb 3.2 oz (67.677 kg)  09/10/13 145 lb 1.9 oz (65.826 kg)  08/02/13 146 lb (66.225 kg)  07/25/13 146 lb (66.225 kg)  07/20/13 147 lb 9.6 oz (66.951 kg)  07/10/13 165 lb (74.844 kg)  06/27/13 151 lb (68.493 kg)  05/31/13 156 lb (70.761 kg)  05/17/13 157 lb (71.215 kg)    Usual Body Weight: unknown  % Usual Body Weight: -  BMI:  Body mass index is 25.63 kg/(m^2).  Estimated Nutritional Needs: Kcal: 1800-2000 Protein: 85-95 grams Fluid: > 1.8 L/day  Skin: no issues noted  Diet Order: Cardiac Meal Completion: 50-95%  EDUCATION NEEDS: -No education needs identified at this time   Intake/Output Summary (Last 24 hours) at 10/10/13 0933 Last data filed at 10/10/13 0900  Gross per 24 hour  Intake    308 ml  Output   1300  ml  Net   -992 ml    Last BM: PTA   Labs:   Recent Labs Lab 10/09/13 0810 10/10/13 0525  NA 147 143  K 3.4* 4.0  CL 107 105  CO2 24 24  BUN 16 19  CREATININE 1.01 1.13  CALCIUM 9.8 9.6  GLUCOSE 80 101*    CBG (last 3)  No results found for this basename: GLUCAP,  in the last 72 hours  Scheduled Meds: . aspirin EC  81 mg Oral Daily  . Chlorhexidine Gluconate Cloth  6 each Topical Q0600  . donepezil  5 mg Oral QHS  . febuxostat  40 mg Oral Daily  . furosemide  40 mg Intravenous BID  . heparin subcutaneous  5,000 Units  Subcutaneous 3 times per day  . hydrALAZINE  10 mg Oral 3 times per day  . isosorbide mononitrate  30 mg Oral Daily  . lisinopril  20 mg Oral Daily  . megestrol  20 mg Oral Daily  . metoprolol succinate  50 mg Oral BID  . mupirocin ointment  1 application Nasal BID  . potassium chloride  40 mEq Oral Daily  . simvastatin  20 mg Oral q1800  . spironolactone  25 mg Oral Daily    Continuous Infusions:   Past Medical History  Diagnosis Date  . HTN (hypertension)   . Hyperlipidemia   . PVCs (premature ventricular contractions)   . Glaucoma   . PVD (peripheral vascular disease)     Right ABI .75, Left .78 (2006)  . Popliteal artery aneurysm, bilateral DECUMENTED CHRONIC PARTIAL OCCLUSION--  PT DENIES CLAUDICATION OR ANY OTHER SYMPTOMS  . Frequency of urination   . Nocturia   . Bladder cancer 03/15/2012  . Chronic systolic CHF (congestive heart failure)     a. NICM - patent cors 06/2012, EF 40% at that time. b. 03/2013 eval: EF 20-25%.  . DVT, bilateral lower limbs 03/2013    a. Bilat DVT dx 03/2013.  . Pulmonary embolism     a. By CT angio 04/2013.  Marland Kitchen Elevated troponin     a. Adm 12/30-04/2013 - not felt to represent ACS; ?due to PE. b. Patent cors 06/2012.  Marland Kitchen NSVT (nonsustained ventricular tachycardia)     a. NSVT 06/2012; NSVT also seen during 03/2013 adm. b. Med rx. Not candidate for ICD given adv age.  Marland Kitchen ETOH abuse   . Syncope     a. Felt to be postural syncope related to diuretics 06/2012.  Marland Kitchen Heart murmur   . DVT (deep venous thrombosis)     "I think in both legs"  . Pneumonia     "couple times"  . DDD (degenerative disc disease)   . Arthritis     "all over"  . Chronic back pain   . Depression     Past Surgical History  Procedure Laterality Date  . Transthoracic echocardiogram  09-22-2010    MODERATE LVH/ EF 87%/ GRADE I DIASTOLIC DYSFUNCTION/ AORTIC SCLEROSIS WITHOUT STENOSIS/  RV  SYSTOLIC  MILDLY REDUCED FUNCTION  . Cardiovascular stress test  10-15-2010    LOW RISK  NUCLEAR STUDY/ NO EVIDENCE OF ISCHEMIA/ NORMAL EF  . Shoulder surgery  1970's    "don't remember which side or what kind of OR"  . Transurethral resection of bladder tumor  10/07/2011    Procedure: TRANSURETHRAL RESECTION OF BLADDER TUMOR (TURBT);  Surgeon: Malka So, MD;  Location: [facility];  Service: Urology;  Laterality: N/A;  . Cystoscopy  10/07/2011    Procedure: CYSTOSCOPY;  Surgeon: Malka So, MD;  Location[facility];  Service: Urology;  Laterality: N/A;  . Cystoscopy with biopsy  03/14/2012    Procedure: CYSTOSCOPY WITH BIOPSY;  Surgeon: Malka So, MD;  Location: [facility];  Service: Urology;  Laterality: N/A;  WITH FULGURATION  . Transurethral resection of prostate  03/14/2012    Procedure: TRANSURETHRAL RESECTION OF THE PROSTATE WITH GYRUS INSTRUMENTS;  Surgeon: Malka So, MD;  Location: [facility];  Service: Urology;  Laterality: N/A;  . Tonsillectomy    . Cataract extraction w/ intraocular lens  implant, bilateral Bilateral   . Cardiac catheterization  05-11-2004  DR [place] PLAQUE/ NORMAL LVF/ EF 55%/  NON-OBSTRUCTIVE LAD 25%  . Cardiac catheterization  06/2012    [place], [place], [place] Pager [phone] After Hours Pager

## 2013-10-11 DIAGNOSIS — I2510 Atherosclerotic heart disease of native coronary artery without angina pectoris: Secondary | ICD-10-CM

## 2013-10-11 DIAGNOSIS — I2699 Other pulmonary embolism without acute cor pulmonale: Secondary | ICD-10-CM

## 2013-10-11 DIAGNOSIS — I5023 Acute on chronic systolic (congestive) heart failure: Secondary | ICD-10-CM | POA: Diagnosis not present

## 2013-10-11 DIAGNOSIS — I10 Essential (primary) hypertension: Secondary | ICD-10-CM | POA: Diagnosis not present

## 2013-10-11 LAB — BASIC METABOLIC PANEL
Anion gap: 12 (ref 5–15)
BUN: 22 mg/dL (ref 6–23)
CO2: 27 mEq/L (ref 19–32)
Calcium: 9.8 mg/dL (ref 8.4–10.5)
Chloride: 103 mEq/L (ref 96–112)
Creatinine, Ser: 1.07 mg/dL (ref 0.50–1.35)
GFR calc Af Amer: 68 mL/min — ABNORMAL LOW (ref >90)
GFR calc non Af Amer: 59 mL/min — ABNORMAL LOW (ref >90)
Glucose, Bld: 89 mg/dL (ref 70–99)
Potassium: 4.7 mEq/L (ref 3.7–5.3)
Sodium: 142 mEq/L (ref 137–147)

## 2013-10-11 LAB — CBC
HCT: 31.7 % — ABNORMAL LOW (ref 39.0–52.0)
Hemoglobin: 10.4 g/dL — ABNORMAL LOW (ref 13.0–17.0)
MCH: 30.1 pg (ref 26.0–34.0)
MCHC: 32.8 g/dL (ref 30.0–36.0)
MCV: 91.9 fL (ref 78.0–100.0)
Platelets: 231 10*3/uL (ref 150–400)
RBC: 3.45 MIL/uL — ABNORMAL LOW (ref 4.22–5.81)
RDW: 14.9 % (ref 11.5–15.5)
WBC: 5.1 10*3/uL (ref 4.0–10.5)

## 2013-10-11 LAB — PROTIME-INR
INR: 1.2 (ref 0.00–1.49)
Prothrombin Time: 15.2 seconds (ref 11.6–15.2)

## 2013-10-11 MED ORDER — ISOSORBIDE MONONITRATE ER 30 MG PO TB24: 30.0000 mg | ORAL_TABLET | Freq: Every day | ORAL | Status: DC

## 2013-10-11 MED ORDER — POTASSIUM CHLORIDE CRYS ER 20 MEQ PO TBCR: 40.0000 meq | EXTENDED_RELEASE_TABLET | Freq: Every day | ORAL | Status: DC

## 2013-10-11 MED ORDER — FUROSEMIDE 40 MG PO TABS: 40.0000 mg | ORAL_TABLET | Freq: Every day | ORAL | Status: DC

## 2013-10-11 NOTE — Progress Notes (Signed)
Subjective: Patient's breathing is better  No CP   Objective: Filed Vitals:   10/10/13 2304 10/11/13 0027 10/11/13 0607 10/11/13 0757  BP: 134/70  164/99 131/77  Pulse: 77  72 68  Temp: 99.3 F (37.4 C)  98.9 F (37.2 C) 98 F (36.7 C)  TempSrc: Oral  Oral Oral  Resp: 17  20 20   Height:      Weight:  154 lb 5.2 oz (70 kg)    SpO2: 94%  98% 98%   Weight change: -4 lb 6.6 oz (-2 kg)  Intake/Output Summary (Last 24 hours) at 10/11/13 1135 Last data filed at 10/11/13 3016  Gross per 24 hour  Intake    120 ml  Output   2350 ml  Net  -2230 ml   Net I/O 6.2 L  General: Alert, awake, oriented x3, in no acute distress Neck:  JVP is increased   Heart: Regular rate and rhythm, without murmurs, rubs, gallops.  Lungs: Clear to auscultation.  No rales or wheezes. Exemities:  Tr edema.   Neuro: Grossly intact, nonfocal.  Tele:  SR  Lab Results: Results for orders placed during the hospital encounter of 10/09/13 (from the past 24 hour(s))  PROTIME-INR     Status: None   Collection Time    10/11/13  3:24 AM      Result Value Ref Range   Prothrombin Time 15.2  11.6 - 15.2 seconds   INR 1.20  0.00 - 1.49  CBC     Status: Abnormal   Collection Time    10/11/13  3:24 AM      Result Value Ref Range   WBC 5.1  4.0 - 10.5 K/uL   RBC 3.45 (*) 4.22 - 5.81 MIL/uL   Hemoglobin 10.4 (*) 13.0 - 17.0 g/dL   HCT 31.7 (*) 39.0 - 52.0 %   MCV 91.9  78.0 - 100.0 fL   MCH 30.1  26.0 - 34.0 pg   MCHC 32.8  30.0 - 36.0 g/dL   RDW 14.9  11.5 - 15.5 %   Platelets 231  150 - 400 K/uL  BASIC METABOLIC PANEL     Status: Abnormal   Collection Time    10/11/13  3:24 AM      Result Value Ref Range   Sodium 142  137 - 147 mEq/L   Potassium 4.7  3.7 - 5.3 mEq/L   Chloride 103  96 - 112 mEq/L   CO2 27  19 - 32 mEq/L   Glucose, Bld 89  70 - 99 mg/dL   BUN 22  6 - 23 mg/dL   Creatinine, Ser 1.07  0.50 - 1.35 mg/dL   Calcium 9.8  8.4 - 10.5 mg/dL   GFR calc non Af Amer 59 (*) >90 mL/min   GFR calc  Af Amer 68 (*) >90 mL/min   Anion gap 12  5 - 15    Studies/Results: No results found.  Medications: Reviewed   @PROBHOSP @  1  Acute on chronic systolic CHF  Has had a large diuresis  Will give Lasix  Pm dose a little early. Go home on lasix 40 qd.  Close outpatient f/u will be impprtant  2.  CAD  Nonobstructive by cath 2014  3.  HTN  Continue meds.  4.  Recent dvt/PE  Countinue on coumadin  Will need f/u as outpatient.    Dorris Carnes   LOS: 2 days   Dorris Carnes 10/11/2013, 11:35 AM

## 2013-10-11 NOTE — Progress Notes (Signed)
Patient evaluated for community based chronic disease management services with [facility] as a benefit of patient's [employer]. Spoke with patient at bedside to explain [facility] services.  Services accepted with written consent from patient.  Son Lycan Davee is the authorized contact along with the spouse.  Patient would like to discuss ensure supplement pricing at home.  Patient will receive a post discharge transition of care call and will be evaluated for monthly home visits for assessments and disease process education.  Left contact information and [facility] literature at bedside. Made Inpatient Case Manager aware that [facility] following. Of note, [facility] Care Management services does not replace or interfere with any services that are arranged by inpatient case management or social work.  For additional questions or referrals please contact Corliss Blacker BSN RN [facility]iaison at [phone].

## 2013-10-11 NOTE — Discharge Summary (Signed)
Physician Discharge Summary  Patient ID: UNNAMED Scott [name] DOB/AGE: [birth year] 80 y.o.  Admit date: 10/09/2013 Discharge date: 10/11/2013  Primary Cardiologist: Dr. Caryl Comes  Admission Diagnoses: CHF  Discharge Diagnoses:  Principal Problem:   CHF (congestive heart failure), NYHA class III Active Problems:   HTN (hypertension)   CAD- non obstructive disease by cath 3/14   NICM (nonischemic cardiomyopathy)- EF 20-25% by Echo 04/12/13   Chronic anticoagulation- subtheraputic INR on admission   NSTEMI (non-ST elevated myocardial infarction)   Discharged Condition: stable  Hospital Course: The patient is a 80 y.o.male with NICM (06/2012 cath with patent cors - EF 40% then, more recently 20-25% not thought to be an ICD candidate due to age, persistent alcohol usage and cognitive decline by Dr Rayann Heman), HTN, chronic LBBB, postural syncope related to diuretics, EtOH abuse and recent DVT (on warfarin) who was transferred from [facility] for management of SOB and elevated troponin (0.98, 0.39). BNP was elevated at > 5000. BP was also elevated at 180/110 on arrival. This improved with IV Lopressor. He had evidence of volume overload on physical exam and was placed on IV Lasix. He diuresed well, nearly 7L total. His breathing improved. It was felt that his elevated troponin was likely subsequent to acute CHF, thus no ischemic eval was warranted. By hospital day 2, he was improved. His dyspnea resolved and BP improved. He was last seen and examined by Dr. Harrington Challenger, who determined he was stable for discharge home. He was discharged home on 40 mg of PO Lasix, along with supplemental potassium. He was also continued on ASA, hydralazine, Imdur, lisinopril, metoprolol, spironolactone and simvastatin. Home health services was also arranged prior to discharge. He is scheduled for post-hospital f/u with Richardson Dopp, PA-C, on 10/18/13.     Consults: None  Significant Diagnostic Studies: Cardiac Panel  (last 3 results)  Recent Labs  10/09/13 0810 10/09/13 1530  TROPONINI 0.98* 0.39*     Treatments: See Hospital Course  Discharge Exam: Blood pressure 153/71, pulse 75, temperature 98 F (36.7 C), temperature source Oral, resp. rate 18, height 5\' 6"  (1.676 m), weight 154 lb 5.2 oz (70 kg), SpO2 98.00%.  Disposition: 01-Home or Self Care      Discharge Instructions   Diet - low sodium heart healthy    Complete by:  As directed      Increase activity slowly    Complete by:  As directed             Medication List         donepezil 10 MG tablet  Commonly known as:  ARICEPT  Take 5 mg by mouth at bedtime.     febuxostat 40 MG tablet  Commonly known as:  ULORIC  Take 1 tablet (40 mg total) by mouth daily.     furosemide 40 MG tablet  Commonly known as:  LASIX  Take 1 tablet (40 mg total) by mouth daily.     gabapentin 100 MG capsule  Commonly known as:  NEURONTIN  Take 200 mg by mouth at bedtime.     HYDROcodone-acetaminophen 10-325 MG per tablet  Commonly known as:  NORCO  Take 1 tablet by mouth every 6 (six) hours as needed for moderate pain.     isosorbide mononitrate 30 MG 24 hr tablet  Commonly known as:  IMDUR  Take 1 tablet (30 mg total) by mouth daily.     lisinopril 20 MG tablet  Commonly known as:  PRINIVIL,ZESTRIL  Take  20 mg by mouth daily.     megestrol 20 MG tablet  Commonly known as:  MEGACE  Take 20 mg by mouth daily.     metoprolol succinate 50 MG 24 hr tablet  Commonly known as:  TOPROL-XL  Take 1 tablet (50 mg total) by mouth 2 (two) times daily.     nitroGLYCERIN 0.4 MG SL tablet  Commonly known as:  NITROSTAT  Place 1 tablet (0.4 mg total) under the tongue every 5 (five) minutes as needed for chest pain (up to 3 doses).     potassium chloride SA 20 MEQ tablet  Commonly known as:  K-DUR,KLOR-CON  Take 2 tablets (40 mEq total) by mouth daily.     simvastatin 10 MG tablet  Commonly known as:  ZOCOR  Take 10 mg by mouth every  morning.     spironolactone 25 MG tablet  Commonly known as:  ALDACTONE  Take 25 mg by mouth every morning.     tamsulosin 0.4 MG Caps capsule  Commonly known as:  FLOMAX  Take 0.4 mg by mouth daily.     warfarin 2.5 MG tablet  Commonly known as:  COUMADIN  Take 2.5 mg by mouth every evening.       Follow-up Information   Follow up with [facility]. (Registered Nurse and Physical Therapy services to start within 24-48 hours of discharge)    Contact information:   [address] [place]  [address] [phone]       Follow up with Richardson Dopp, PA-C On 10/18/2013. (2:20 pm )    Specialty:  Physician Assistant   Contact information:   [address] [address] [phone]      TIME SPENT ON DISCHARGE, INCLUDING PHYSICIAN TIME: >30 MINUTES  Signed: Lyda Jester 10/11/2013, 3:15 PM

## 2013-10-13 DIAGNOSIS — I739 Peripheral vascular disease, unspecified: Secondary | ICD-10-CM | POA: Diagnosis not present

## 2013-10-13 DIAGNOSIS — I509 Heart failure, unspecified: Secondary | ICD-10-CM | POA: Diagnosis not present

## 2013-10-13 DIAGNOSIS — I214 Non-ST elevation (NSTEMI) myocardial infarction: Secondary | ICD-10-CM | POA: Diagnosis not present

## 2013-10-13 DIAGNOSIS — I10 Essential (primary) hypertension: Secondary | ICD-10-CM | POA: Diagnosis not present

## 2013-10-13 DIAGNOSIS — F0390 Unspecified dementia without behavioral disturbance: Secondary | ICD-10-CM | POA: Diagnosis not present

## 2013-10-13 DIAGNOSIS — Z86718 Personal history of other venous thrombosis and embolism: Secondary | ICD-10-CM | POA: Diagnosis not present

## 2013-10-13 DIAGNOSIS — I428 Other cardiomyopathies: Secondary | ICD-10-CM | POA: Diagnosis not present

## 2013-10-13 DIAGNOSIS — I5023 Acute on chronic systolic (congestive) heart failure: Secondary | ICD-10-CM | POA: Diagnosis not present

## 2013-10-15 ENCOUNTER — Telehealth: Payer: Self-pay | Admitting: Internal Medicine

## 2013-10-15 DIAGNOSIS — I214 Non-ST elevation (NSTEMI) myocardial infarction: Secondary | ICD-10-CM | POA: Diagnosis not present

## 2013-10-15 DIAGNOSIS — I5023 Acute on chronic systolic (congestive) heart failure: Secondary | ICD-10-CM | POA: Diagnosis not present

## 2013-10-15 DIAGNOSIS — I428 Other cardiomyopathies: Secondary | ICD-10-CM | POA: Diagnosis not present

## 2013-10-15 DIAGNOSIS — I10 Essential (primary) hypertension: Secondary | ICD-10-CM | POA: Diagnosis not present

## 2013-10-15 DIAGNOSIS — I509 Heart failure, unspecified: Secondary | ICD-10-CM | POA: Diagnosis not present

## 2013-10-15 DIAGNOSIS — F0390 Unspecified dementia without behavioral disturbance: Secondary | ICD-10-CM | POA: Diagnosis not present

## 2013-10-15 NOTE — Telephone Encounter (Signed)
New Message  [name] with [facility] called states that she has visited the pt after being discharged from [facility]. She reports that the pt is having BP issues..   Orthostatic BP: 90/50 Sitting: 62/42 Standing: 82/44   Non systematic except for dizziness and extreme weakness. pt weighs 146.5 with no signs of edema. Also the pt is taking two different fluid pills; Lasix 20 mg daily and Spirolactone 25 mg daily.

## 2013-10-15 NOTE — Telephone Encounter (Signed)
SPOKE WITH  [name] FROM [facility]  SEE  B/P  BELOW ,PT  IS  NOT  HAVING SYMPTOMS  DID HAVE BMET DONE TODAY  HOME  HEALTH TO SEE PT   WED  WILL FORWARD MESSAGE  TO  DR  Rayann Heman  FOR  REVIEW   AS  STATED  IN  MESSAGE PT  TAKNG  LASIX  20 MG ,  SPIROLACTONE  25 MG  EVERY DAY , LISINOPRIL  20 MG QD, AND  METOPROLOL  50 MG BID .Adonis Housekeeper

## 2013-10-17 DIAGNOSIS — I5023 Acute on chronic systolic (congestive) heart failure: Secondary | ICD-10-CM | POA: Diagnosis not present

## 2013-10-17 DIAGNOSIS — F0390 Unspecified dementia without behavioral disturbance: Secondary | ICD-10-CM | POA: Diagnosis not present

## 2013-10-17 DIAGNOSIS — I214 Non-ST elevation (NSTEMI) myocardial infarction: Secondary | ICD-10-CM | POA: Diagnosis not present

## 2013-10-17 DIAGNOSIS — I509 Heart failure, unspecified: Secondary | ICD-10-CM | POA: Diagnosis not present

## 2013-10-17 DIAGNOSIS — I10 Essential (primary) hypertension: Secondary | ICD-10-CM | POA: Diagnosis not present

## 2013-10-17 DIAGNOSIS — I428 Other cardiomyopathies: Secondary | ICD-10-CM | POA: Diagnosis not present

## 2013-10-17 NOTE — Telephone Encounter (Signed)
Primary Cardiologist is Dr Percival Spanish.  I will forward to him.

## 2013-10-18 ENCOUNTER — Encounter: Payer: Self-pay | Admitting: Physician Assistant

## 2013-10-18 ENCOUNTER — Ambulatory Visit (INDEPENDENT_AMBULATORY_CARE_PROVIDER_SITE_OTHER): Payer: Medicare Other | Admitting: Physician Assistant

## 2013-10-18 VITALS — BP 110/50 | HR 67 | Ht 66.5 in | Wt 144.8 lb

## 2013-10-18 DIAGNOSIS — F1010 Alcohol abuse, uncomplicated: Secondary | ICD-10-CM

## 2013-10-18 DIAGNOSIS — I4729 Other ventricular tachycardia: Secondary | ICD-10-CM

## 2013-10-18 DIAGNOSIS — I5022 Chronic systolic (congestive) heart failure: Secondary | ICD-10-CM

## 2013-10-18 DIAGNOSIS — I2510 Atherosclerotic heart disease of native coronary artery without angina pectoris: Secondary | ICD-10-CM

## 2013-10-18 DIAGNOSIS — I428 Other cardiomyopathies: Secondary | ICD-10-CM

## 2013-10-18 DIAGNOSIS — I10 Essential (primary) hypertension: Secondary | ICD-10-CM

## 2013-10-18 DIAGNOSIS — I472 Ventricular tachycardia: Secondary | ICD-10-CM | POA: Diagnosis not present

## 2013-10-18 DIAGNOSIS — I82403 Acute embolism and thrombosis of unspecified deep veins of lower extremity, bilateral: Secondary | ICD-10-CM

## 2013-10-18 DIAGNOSIS — E785 Hyperlipidemia, unspecified: Secondary | ICD-10-CM

## 2013-10-18 DIAGNOSIS — I82409 Acute embolism and thrombosis of unspecified deep veins of unspecified lower extremity: Secondary | ICD-10-CM

## 2013-10-18 LAB — BASIC METABOLIC PANEL
BUN: 33 mg/dL — ABNORMAL HIGH (ref 6–23)
CO2: 24 mEq/L (ref 19–32)
Calcium: 10.1 mg/dL (ref 8.4–10.5)
Chloride: 104 mEq/L (ref 96–112)
Creatinine, Ser: 1.5 mg/dL (ref 0.4–1.5)
GFR: 56.1 mL/min — ABNORMAL LOW (ref >60.00)
Glucose, Bld: 114 mg/dL — ABNORMAL HIGH (ref 70–99)
Potassium: 4.3 mEq/L (ref 3.5–5.1)
Sodium: 135 mEq/L (ref 135–145)

## 2013-10-18 MED ORDER — FUROSEMIDE 40 MG PO TABS: ORAL_TABLET | ORAL | Status: DC

## 2013-10-18 MED ORDER — ISOSORBIDE MONONITRATE ER 30 MG PO TB24: 15.0000 mg | ORAL_TABLET | Freq: Every day | ORAL | Status: DC

## 2013-10-18 NOTE — Patient Instructions (Signed)
DECREASE LASIX TO 40 MG ON Monday, WED, AND FRI'S AND 20 MG ON ALL OTHER DAYS  DECREASE IMDUR TO 15 MG DAILY  LAB WORK TODAY; BMET  TRY TO [place] RN CALL NEXT Thursday 10/25/13 WITH READINGS OF YOUR WEIGHT [phone]  Your physician recommends that you schedule a follow-up appointment in: [place] DR. HOCHREIN IN THE [place] OFFICE

## 2013-10-18 NOTE — Progress Notes (Signed)
Cardiology Office Note    Date:  10/18/2013   ID:  Scott [name], DOB 1921/08/08, MRN [id number]  PCP:  Redge Gainer, MD  Cardiologist:  Dr. Minus Breeding   Electrophysiologist:  Dr. Thompson Grayer    History of Present Illness: Scott [name] is a 80 y.o. male with a hx of NICM, systolic CHF, no significant CAD by [facility] 06/2012, HTN, LBBB, postural syncope, ETOH abuse (DUI in 2015), DVT/PE 03/2013, chronic coumadin Rx (followed by PCP), PVCs, NSVT ([place] 07/2013), HL, PAD.  He was seen by Dr. Thompson Grayer in 09/2013 and not felt to be a candidate for ICD.  He has been told to not drive in the past.  He was admitted 0/16-0/1 with a/c systolic CHF.  Troponin was mildly elevated and this was felt to be from demand ischemia.  With cath in 06/2012 with no significant CAD, no invasive w/u was planned.  He returns for follow up.    He is here with his son. He has home health nursing coming to his house. His medications are delivered in pill packs. This helps with compliance.  He has refrained from using alcohol. Since discharge, he continues to feel weak. he has No energy. He cannot really tell me if he is more short of breath. He is probably NYHA class IIb. He denies orthopnea or PND. LE edema is much improved. His weight is down 10 pounds since discharge. He denies chest pain or syncope.   Studies:  - Nuclear (10/15/10):  Inferior scar, no ischemia, not gated  - LHC (06/28/12):  Mild luminal irregularities without significant CAD, EF 45-50%  - Echo (04/12/13):  Moderate LVH, EF 20-25%, diffuse HK, inferolateral severe HK, AV with Lambls excrescence on noncoronary leaflet, moderate LAE, severely reduced RV function, trivial effusion   Recent Labs: 03/15/2013: TSH 1.192  03/27/2013: BNP 2270.0*  04/10/2013: Pro B Natriuretic peptide (BNP) 10535.0*  05/17/2013: ALT 9; HDL Cholesterol by NMR 50; LDL (calc) 42  10/11/2013: Creatinine 1.07; Hemoglobin 10.4*; Potassium 4.7   Wt Readings from Last 3  Encounters:  10/18/13 144 lb 12.8 oz (65.681 kg)  10/11/13 154 lb 5.2 oz (70 kg)  09/14/13 149 lb 3.2 oz (67.677 kg)     Past Medical History  Diagnosis Date  . HTN (hypertension)   . Hyperlipidemia   . PVCs (premature ventricular contractions)   . Glaucoma   . PVD (peripheral vascular disease)     Right ABI .75, Left .78 (2006)  . Popliteal artery aneurysm, bilateral DECUMENTED CHRONIC PARTIAL OCCLUSION--  PT DENIES CLAUDICATION OR ANY OTHER SYMPTOMS  . Frequency of urination   . Nocturia   . Bladder cancer 03/15/2012  . Chronic systolic CHF (congestive heart failure)     a. NICM - patent cors 06/2012, EF 40% at that time. b. 03/2013 eval: EF 20-25%.  . DVT, bilateral lower limbs 03/2013    a. Bilat DVT dx 03/2013.  . Pulmonary embolism     a. By CT angio 04/2013.  Marland Kitchen Elevated troponin     a. Adm 12/30-04/2013 - not felt to represent ACS; ?due to PE. b. Patent cors 06/2012.  Marland Kitchen NSVT (nonsustained ventricular tachycardia)     a. NSVT 06/2012; NSVT also seen during 03/2013 adm. b. Med rx. Not candidate for ICD given adv age.  Marland Kitchen ETOH abuse   . Syncope     a. Felt to be postural syncope related to diuretics 06/2012.  Marland Kitchen Heart murmur   . DVT (deep  venous thrombosis)     "I think in both legs"  . Pneumonia     "couple times"  . DDD (degenerative disc disease)   . Arthritis     "all over"  . Chronic back pain   . Depression     Current Outpatient Prescriptions  Medication Sig Dispense Refill  . donepezil (ARICEPT) 10 MG tablet Take 5 mg by mouth at bedtime.      . febuxostat (ULORIC) 40 MG tablet Take 1 tablet (40 mg total) by mouth daily.  30 tablet  3  . furosemide (LASIX) 40 MG tablet Take 1 tablet (40 mg total) by mouth daily.  30 tablet  5  . gabapentin (NEURONTIN) 100 MG capsule Take 200 mg by mouth at bedtime.      Marland Kitchen HYDROcodone-acetaminophen (NORCO) 10-325 MG per tablet Take 1 tablet by mouth every 6 (six) hours as needed for moderate pain.       . isosorbide mononitrate  (IMDUR) 30 MG 24 hr tablet Take 1 tablet (30 mg total) by mouth daily.  30 tablet  5  . lisinopril (PRINIVIL,ZESTRIL) 20 MG tablet Take 20 mg by mouth daily.      . megestrol (MEGACE) 20 MG tablet Take 20 mg by mouth daily.      . metoprolol succinate (TOPROL-XL) 50 MG 24 hr tablet Take 1 tablet (50 mg total) by mouth 2 (two) times daily.  60 tablet  3  . nitroGLYCERIN (NITROSTAT) 0.4 MG SL tablet Place 1 tablet (0.4 mg total) under the tongue every 5 (five) minutes as needed for chest pain (up to 3 doses).  25 tablet  3  . potassium chloride SA (K-DUR,KLOR-CON) 20 MEQ tablet Take 2 tablets (40 mEq total) by mouth daily.  60 tablet  5  . simvastatin (ZOCOR) 10 MG tablet Take 10 mg by mouth every morning.      Marland Kitchen spironolactone (ALDACTONE) 25 MG tablet Take 25 mg by mouth every morning.      . tamsulosin (FLOMAX) 0.4 MG CAPS capsule Take 0.4 mg by mouth daily.      Marland Kitchen warfarin (COUMADIN) 2.5 MG tablet Take 2.5 mg by mouth every evening.       No current facility-administered medications for this visit.    Allergies:   Lipitor   Social History:  The patient  reports that he quit smoking about 38 years ago. His smoking use included Cigarettes. He started smoking about 71 years ago. He has a 20 pack-year smoking history. He has never used smokeless tobacco. He reports that he drinks alcohol. He reports that he does not use illicit drugs.   Family History:  The patient's family history includes Hypertension in his mother; Lung cancer in his sister.   ROS:  Please see the history of present illness.   He denies any bleeding problems.   All other systems reviewed and negative.   PHYSICAL EXAM: VS:  BP 110/50  Pulse 67  Ht 5' 6.5" (1.689 m)  Wt 144 lb 12.8 oz (65.681 kg)  BMI 23.02 kg/m2 Well nourished, well developed, in no acute distress HEENT: normal Neck: no JVD Cardiac:  normal S1, S2; RRR; no murmur Lungs:  clear to auscultation bilaterally, no wheezing, rhonchi or rales Abd: soft,  nontender, no hepatomegaly Ext: very trace bilateral ankle edema Skin: loss of skin turgor Neuro:  CNs 2-12 intact, no focal abnormalities noted  EKG:  NSR, HR 67, normal axis, inferolateral T wave inversions     ASSESSMENT  AND PLAN:  1. Chronic systolic heart failure:  Volume appears stable on exam. His weight is down significantly since discharge. He noted significant fatigue.  I suspect he is somewhat over diuresed. We had a long discussion about adjusting his medications. This would be quite problematic for him given that he takes his medications from a pill pack. We ultimately decided to change his Lasix to 40 mg on Monday, Wednesday, Friday. He will take Lasix 20 mg on all other days. His blood pressure is running somewhat low. I will decrease his isosorbide to 15 mg daily. Check a basic metabolic panel today. I have asked him to try to weigh daily. I have asked that the home health nurse contact me with his weights next week when she visits. We will try to arrange earlier follow up with Dr. Percival Spanish in [place]. 2. NICM (nonischemic cardiomyopathy)- EF 20-25% by Echo 04/12/13:  Continue beta blocker, ACE inhibitor, nitrates, spironolactone. 3. NSVT (nonsustained ventricular tachycardia)- not an ICD candidate 4. Minimal CAD by cath 06/2012:  No angina. He is not on aspirin as he is on Coumadin. Continue statin. 5. DVT of lower extremity, bilateral:  He remains on Coumadin which is followed by primary care. 6. Essential hypertension:  Controlled. 7. Hyperlipidemia:  Continue statin. 8. Alcohol abuse:  He is currently refraining from using alcohol. 9. Disposition: Follow up with Dr. Percival Spanish in [place] in one month.   Signed, Versie Starks, MHS 10/18/2013 3:14 PM    [facility] [place], [place], [place]  [address] Phone: [phone]; Fax: [phone]

## 2013-10-19 ENCOUNTER — Telehealth: Payer: Self-pay | Admitting: *Deleted

## 2013-10-19 DIAGNOSIS — I509 Heart failure, unspecified: Secondary | ICD-10-CM | POA: Diagnosis not present

## 2013-10-19 DIAGNOSIS — I214 Non-ST elevation (NSTEMI) myocardial infarction: Secondary | ICD-10-CM | POA: Diagnosis not present

## 2013-10-19 DIAGNOSIS — I10 Essential (primary) hypertension: Secondary | ICD-10-CM | POA: Diagnosis not present

## 2013-10-19 DIAGNOSIS — I5023 Acute on chronic systolic (congestive) heart failure: Secondary | ICD-10-CM | POA: Diagnosis not present

## 2013-10-19 DIAGNOSIS — F0390 Unspecified dementia without behavioral disturbance: Secondary | ICD-10-CM | POA: Diagnosis not present

## 2013-10-19 DIAGNOSIS — I428 Other cardiomyopathies: Secondary | ICD-10-CM | POA: Diagnosis not present

## 2013-10-19 NOTE — Telephone Encounter (Signed)
s/w pt's son Scott [name] about lab results and to stay w/med changes as of ov 7/9 for lasix. I will call [facility] to get bmet in 10-14 days. Scott [name] verbalized understanding to Plan of Care.  I s/w HHRN and BMET already scheduled for 7/22, will fax results to [facility]. PA and Dr. Rayann Heman.

## 2013-10-20 DIAGNOSIS — I10 Essential (primary) hypertension: Secondary | ICD-10-CM | POA: Diagnosis not present

## 2013-10-20 DIAGNOSIS — F0390 Unspecified dementia without behavioral disturbance: Secondary | ICD-10-CM | POA: Diagnosis not present

## 2013-10-20 DIAGNOSIS — I509 Heart failure, unspecified: Secondary | ICD-10-CM | POA: Diagnosis not present

## 2013-10-20 DIAGNOSIS — I214 Non-ST elevation (NSTEMI) myocardial infarction: Secondary | ICD-10-CM | POA: Diagnosis not present

## 2013-10-20 DIAGNOSIS — I428 Other cardiomyopathies: Secondary | ICD-10-CM | POA: Diagnosis not present

## 2013-10-20 DIAGNOSIS — I5023 Acute on chronic systolic (congestive) heart failure: Secondary | ICD-10-CM | POA: Diagnosis not present

## 2013-10-21 NOTE — Telephone Encounter (Signed)
Patient was seen on 7/9

## 2013-10-23 DIAGNOSIS — I10 Essential (primary) hypertension: Secondary | ICD-10-CM | POA: Diagnosis not present

## 2013-10-23 DIAGNOSIS — I509 Heart failure, unspecified: Secondary | ICD-10-CM | POA: Diagnosis not present

## 2013-10-23 DIAGNOSIS — I214 Non-ST elevation (NSTEMI) myocardial infarction: Secondary | ICD-10-CM | POA: Diagnosis not present

## 2013-10-23 DIAGNOSIS — I5023 Acute on chronic systolic (congestive) heart failure: Secondary | ICD-10-CM | POA: Diagnosis not present

## 2013-10-23 DIAGNOSIS — F0390 Unspecified dementia without behavioral disturbance: Secondary | ICD-10-CM | POA: Diagnosis not present

## 2013-10-23 DIAGNOSIS — I428 Other cardiomyopathies: Secondary | ICD-10-CM | POA: Diagnosis not present

## 2013-10-24 ENCOUNTER — Other Ambulatory Visit: Payer: Self-pay | Admitting: Family Medicine

## 2013-10-24 DIAGNOSIS — I10 Essential (primary) hypertension: Secondary | ICD-10-CM | POA: Diagnosis not present

## 2013-10-24 DIAGNOSIS — I428 Other cardiomyopathies: Secondary | ICD-10-CM | POA: Diagnosis not present

## 2013-10-24 DIAGNOSIS — I214 Non-ST elevation (NSTEMI) myocardial infarction: Secondary | ICD-10-CM | POA: Diagnosis not present

## 2013-10-24 DIAGNOSIS — F0390 Unspecified dementia without behavioral disturbance: Secondary | ICD-10-CM | POA: Diagnosis not present

## 2013-10-24 DIAGNOSIS — I509 Heart failure, unspecified: Secondary | ICD-10-CM | POA: Diagnosis not present

## 2013-10-24 DIAGNOSIS — I5023 Acute on chronic systolic (congestive) heart failure: Secondary | ICD-10-CM | POA: Diagnosis not present

## 2013-10-25 DIAGNOSIS — F0390 Unspecified dementia without behavioral disturbance: Secondary | ICD-10-CM | POA: Diagnosis not present

## 2013-10-25 DIAGNOSIS — I5023 Acute on chronic systolic (congestive) heart failure: Secondary | ICD-10-CM | POA: Diagnosis not present

## 2013-10-25 DIAGNOSIS — I509 Heart failure, unspecified: Secondary | ICD-10-CM | POA: Diagnosis not present

## 2013-10-25 DIAGNOSIS — I10 Essential (primary) hypertension: Secondary | ICD-10-CM | POA: Diagnosis not present

## 2013-10-25 DIAGNOSIS — I428 Other cardiomyopathies: Secondary | ICD-10-CM | POA: Diagnosis not present

## 2013-10-25 DIAGNOSIS — I214 Non-ST elevation (NSTEMI) myocardial infarction: Secondary | ICD-10-CM | POA: Diagnosis not present

## 2013-10-29 DIAGNOSIS — I428 Other cardiomyopathies: Secondary | ICD-10-CM | POA: Diagnosis not present

## 2013-10-29 DIAGNOSIS — I214 Non-ST elevation (NSTEMI) myocardial infarction: Secondary | ICD-10-CM | POA: Diagnosis not present

## 2013-10-29 DIAGNOSIS — I5023 Acute on chronic systolic (congestive) heart failure: Secondary | ICD-10-CM | POA: Diagnosis not present

## 2013-10-29 DIAGNOSIS — F0390 Unspecified dementia without behavioral disturbance: Secondary | ICD-10-CM | POA: Diagnosis not present

## 2013-10-29 DIAGNOSIS — I10 Essential (primary) hypertension: Secondary | ICD-10-CM | POA: Diagnosis not present

## 2013-10-29 DIAGNOSIS — I509 Heart failure, unspecified: Secondary | ICD-10-CM | POA: Diagnosis not present

## 2013-10-30 DIAGNOSIS — F0390 Unspecified dementia without behavioral disturbance: Secondary | ICD-10-CM | POA: Diagnosis not present

## 2013-10-30 DIAGNOSIS — I214 Non-ST elevation (NSTEMI) myocardial infarction: Secondary | ICD-10-CM | POA: Diagnosis not present

## 2013-10-30 DIAGNOSIS — I5023 Acute on chronic systolic (congestive) heart failure: Secondary | ICD-10-CM | POA: Diagnosis not present

## 2013-10-30 DIAGNOSIS — I10 Essential (primary) hypertension: Secondary | ICD-10-CM | POA: Diagnosis not present

## 2013-10-30 DIAGNOSIS — I428 Other cardiomyopathies: Secondary | ICD-10-CM | POA: Diagnosis not present

## 2013-10-30 DIAGNOSIS — I509 Heart failure, unspecified: Secondary | ICD-10-CM | POA: Diagnosis not present

## 2013-11-01 DIAGNOSIS — I509 Heart failure, unspecified: Secondary | ICD-10-CM | POA: Diagnosis not present

## 2013-11-01 DIAGNOSIS — I5023 Acute on chronic systolic (congestive) heart failure: Secondary | ICD-10-CM | POA: Diagnosis not present

## 2013-11-01 DIAGNOSIS — I10 Essential (primary) hypertension: Secondary | ICD-10-CM | POA: Diagnosis not present

## 2013-11-01 DIAGNOSIS — I428 Other cardiomyopathies: Secondary | ICD-10-CM | POA: Diagnosis not present

## 2013-11-01 DIAGNOSIS — F0390 Unspecified dementia without behavioral disturbance: Secondary | ICD-10-CM | POA: Diagnosis not present

## 2013-11-01 DIAGNOSIS — I214 Non-ST elevation (NSTEMI) myocardial infarction: Secondary | ICD-10-CM | POA: Diagnosis not present

## 2013-11-06 DIAGNOSIS — F0390 Unspecified dementia without behavioral disturbance: Secondary | ICD-10-CM | POA: Diagnosis not present

## 2013-11-06 DIAGNOSIS — I5023 Acute on chronic systolic (congestive) heart failure: Secondary | ICD-10-CM | POA: Diagnosis not present

## 2013-11-06 DIAGNOSIS — I509 Heart failure, unspecified: Secondary | ICD-10-CM | POA: Diagnosis not present

## 2013-11-06 DIAGNOSIS — I10 Essential (primary) hypertension: Secondary | ICD-10-CM | POA: Diagnosis not present

## 2013-11-06 DIAGNOSIS — I214 Non-ST elevation (NSTEMI) myocardial infarction: Secondary | ICD-10-CM | POA: Diagnosis not present

## 2013-11-06 DIAGNOSIS — I428 Other cardiomyopathies: Secondary | ICD-10-CM | POA: Diagnosis not present

## 2013-11-07 ENCOUNTER — Telehealth: Payer: Self-pay | Admitting: Pharmacist

## 2013-11-07 DIAGNOSIS — I428 Other cardiomyopathies: Secondary | ICD-10-CM | POA: Diagnosis not present

## 2013-11-07 DIAGNOSIS — I5023 Acute on chronic systolic (congestive) heart failure: Secondary | ICD-10-CM | POA: Diagnosis not present

## 2013-11-07 DIAGNOSIS — I509 Heart failure, unspecified: Secondary | ICD-10-CM | POA: Diagnosis not present

## 2013-11-07 DIAGNOSIS — F0390 Unspecified dementia without behavioral disturbance: Secondary | ICD-10-CM | POA: Diagnosis not present

## 2013-11-07 DIAGNOSIS — I10 Essential (primary) hypertension: Secondary | ICD-10-CM | POA: Diagnosis not present

## 2013-11-07 DIAGNOSIS — I214 Non-ST elevation (NSTEMI) myocardial infarction: Secondary | ICD-10-CM | POA: Diagnosis not present

## 2013-11-07 NOTE — Telephone Encounter (Signed)
Noticed that patient is past due INR and home health has been going out.  Found out from [place] with [facility] that Kenney Houseman is his home health nurse.  Tried to call Kenney Houseman [phone] ti order PT/INT - left message.

## 2013-11-08 NOTE — Telephone Encounter (Signed)
[name] left message on my VM that INR was ordered for [name] for his next blood draw next week .

## 2013-11-09 ENCOUNTER — Ambulatory Visit (INDEPENDENT_AMBULATORY_CARE_PROVIDER_SITE_OTHER): Payer: Medicare Other | Admitting: Pharmacist

## 2013-11-09 DIAGNOSIS — I214 Non-ST elevation (NSTEMI) myocardial infarction: Secondary | ICD-10-CM | POA: Diagnosis not present

## 2013-11-09 DIAGNOSIS — I82409 Acute embolism and thrombosis of unspecified deep veins of unspecified lower extremity: Secondary | ICD-10-CM | POA: Diagnosis not present

## 2013-11-09 DIAGNOSIS — I2699 Other pulmonary embolism without acute cor pulmonale: Secondary | ICD-10-CM | POA: Diagnosis not present

## 2013-11-09 DIAGNOSIS — F0390 Unspecified dementia without behavioral disturbance: Secondary | ICD-10-CM | POA: Diagnosis not present

## 2013-11-09 DIAGNOSIS — I428 Other cardiomyopathies: Secondary | ICD-10-CM | POA: Diagnosis not present

## 2013-11-09 DIAGNOSIS — I10 Essential (primary) hypertension: Secondary | ICD-10-CM | POA: Diagnosis not present

## 2013-11-09 DIAGNOSIS — I509 Heart failure, unspecified: Secondary | ICD-10-CM | POA: Diagnosis not present

## 2013-11-09 DIAGNOSIS — I5023 Acute on chronic systolic (congestive) heart failure: Secondary | ICD-10-CM | POA: Diagnosis not present

## 2013-11-09 DIAGNOSIS — I82403 Acute embolism and thrombosis of unspecified deep veins of lower extremity, bilateral: Secondary | ICD-10-CM

## 2013-11-09 LAB — POCT INR: INR: 1.7

## 2013-11-09 NOTE — Progress Notes (Signed)
instrucation given to [facility] nurse and she communicated to patient while she was in the home.

## 2013-11-12 ENCOUNTER — Other Ambulatory Visit: Payer: Self-pay | Admitting: Family Medicine

## 2013-11-15 ENCOUNTER — Other Ambulatory Visit: Payer: Medicare Other

## 2013-11-15 DIAGNOSIS — I5023 Acute on chronic systolic (congestive) heart failure: Secondary | ICD-10-CM | POA: Diagnosis not present

## 2013-11-15 DIAGNOSIS — F0390 Unspecified dementia without behavioral disturbance: Secondary | ICD-10-CM | POA: Diagnosis not present

## 2013-11-15 DIAGNOSIS — I10 Essential (primary) hypertension: Secondary | ICD-10-CM | POA: Diagnosis not present

## 2013-11-15 DIAGNOSIS — I428 Other cardiomyopathies: Secondary | ICD-10-CM | POA: Diagnosis not present

## 2013-11-15 DIAGNOSIS — I509 Heart failure, unspecified: Secondary | ICD-10-CM | POA: Diagnosis not present

## 2013-11-15 DIAGNOSIS — I214 Non-ST elevation (NSTEMI) myocardial infarction: Secondary | ICD-10-CM | POA: Diagnosis not present

## 2013-11-15 LAB — POCT INR: INR: 1.4

## 2013-11-16 ENCOUNTER — Other Ambulatory Visit: Payer: Self-pay | Admitting: Family Medicine

## 2013-11-16 ENCOUNTER — Ambulatory Visit (INDEPENDENT_AMBULATORY_CARE_PROVIDER_SITE_OTHER): Payer: Medicare Other | Admitting: Pharmacist

## 2013-11-16 DIAGNOSIS — I2699 Other pulmonary embolism without acute cor pulmonale: Secondary | ICD-10-CM

## 2013-11-16 DIAGNOSIS — I82403 Acute embolism and thrombosis of unspecified deep veins of lower extremity, bilateral: Secondary | ICD-10-CM

## 2013-11-16 DIAGNOSIS — I82409 Acute embolism and thrombosis of unspecified deep veins of unspecified lower extremity: Secondary | ICD-10-CM | POA: Diagnosis not present

## 2013-11-23 ENCOUNTER — Ambulatory Visit (INDEPENDENT_AMBULATORY_CARE_PROVIDER_SITE_OTHER): Payer: Medicare Other | Admitting: Pharmacist

## 2013-11-23 ENCOUNTER — Other Ambulatory Visit: Payer: Medicare Other

## 2013-11-23 ENCOUNTER — Telehealth: Payer: Self-pay | Admitting: *Deleted

## 2013-11-23 DIAGNOSIS — I2699 Other pulmonary embolism without acute cor pulmonale: Secondary | ICD-10-CM

## 2013-11-23 DIAGNOSIS — I10 Essential (primary) hypertension: Secondary | ICD-10-CM | POA: Diagnosis not present

## 2013-11-23 DIAGNOSIS — I509 Heart failure, unspecified: Secondary | ICD-10-CM | POA: Diagnosis not present

## 2013-11-23 DIAGNOSIS — I82409 Acute embolism and thrombosis of unspecified deep veins of unspecified lower extremity: Secondary | ICD-10-CM

## 2013-11-23 DIAGNOSIS — I428 Other cardiomyopathies: Secondary | ICD-10-CM | POA: Diagnosis not present

## 2013-11-23 DIAGNOSIS — I214 Non-ST elevation (NSTEMI) myocardial infarction: Secondary | ICD-10-CM | POA: Diagnosis not present

## 2013-11-23 DIAGNOSIS — F0390 Unspecified dementia without behavioral disturbance: Secondary | ICD-10-CM | POA: Diagnosis not present

## 2013-11-23 DIAGNOSIS — I5023 Acute on chronic systolic (congestive) heart failure: Secondary | ICD-10-CM | POA: Diagnosis not present

## 2013-11-23 DIAGNOSIS — I82403 Acute embolism and thrombosis of unspecified deep veins of lower extremity, bilateral: Secondary | ICD-10-CM

## 2013-11-23 LAB — POCT INR: INR: 1.5

## 2013-11-23 NOTE — Telephone Encounter (Signed)
PT 18.4 and INR 1.5 Call [place] with [facility] with directions.

## 2013-11-23 NOTE — Telephone Encounter (Signed)
Increase warfarin dose to 7.5mg  (= 3 tablets ) on MWF and 5mg  (= 2 tablets) all other days.  [facility] notified so they can fill pill cards.  [name] with [facility] notified.

## 2013-11-27 ENCOUNTER — Encounter: Payer: Self-pay | Admitting: Cardiology

## 2013-11-27 ENCOUNTER — Ambulatory Visit (INDEPENDENT_AMBULATORY_CARE_PROVIDER_SITE_OTHER): Payer: Medicare Other | Admitting: Cardiology

## 2013-11-27 ENCOUNTER — Other Ambulatory Visit: Payer: Self-pay | Admitting: *Deleted

## 2013-11-27 VITALS — BP 90/50 | HR 60 | Ht 66.5 in | Wt 150.0 lb

## 2013-11-27 DIAGNOSIS — I2510 Atherosclerotic heart disease of native coronary artery without angina pectoris: Secondary | ICD-10-CM | POA: Diagnosis not present

## 2013-11-27 DIAGNOSIS — I509 Heart failure, unspecified: Secondary | ICD-10-CM | POA: Diagnosis not present

## 2013-11-27 DIAGNOSIS — I5022 Chronic systolic (congestive) heart failure: Secondary | ICD-10-CM

## 2013-11-27 MED ORDER — LOSARTAN POTASSIUM 25 MG PO TABS: 25.0000 mg | ORAL_TABLET | Freq: Every day | ORAL | Status: DC

## 2013-11-27 NOTE — Progress Notes (Signed)
=    HPI Scott [name] is a 80 y.o.male with NICM (06/2012 cath with patent cors - EF 40% then, more recently 20-25%), HTN, chronic LBBB, postural syncope related to diuretics, EtOH abuse, and recent DVT.  He was hospitalized recently with volume overload. He followed up with [facility]  After that hospitalizationand was thought to be somewhat overdiuresed. He had his isosorbide reduced.  Since then he has had a 6 pound weight gain with this has been somewhat slow. He is not however having the acute shortness of breath or "grunting" that he had prior to his hospitalization. He's not describing PND or orthopnea. He's not having palpitations, presyncope or syncope. He did have a DUI but is not now drinking per his son.  He is not driving.   Allergies  Allergen Reactions  . Lipitor [Atorvastatin] Other (See Comments)    unknown    Current Outpatient Prescriptions  Medication Sig Dispense Refill  . donepezil (ARICEPT) 10 MG tablet TAKE ONE TABLET AT BEDTIME  30 tablet  3  . furosemide (LASIX) 40 MG tablet 40 MG ON MON, WED, AND FRI'S; THEN 20 MG ALL OTHER DAYS  30 tablet  5  . gabapentin (NEURONTIN) 100 MG capsule Take 200 mg by mouth at bedtime.      Marland Kitchen HYDROcodone-acetaminophen (NORCO) 10-325 MG per tablet Take 1 tablet by mouth every 6 (six) hours as needed for moderate pain.       . isosorbide mononitrate (IMDUR) 30 MG 24 hr tablet Take 0.5 tablets (15 mg total) by mouth daily.  30 tablet  5  . lisinopril (PRINIVIL,ZESTRIL) 20 MG tablet Take 20 mg by mouth daily.      . megestrol (MEGACE) 20 MG tablet TAKE 1 TABLET ONCE A DAY  30 tablet  3  . metoprolol succinate (TOPROL-XL) 50 MG 24 hr tablet Take 1 tablet (50 mg total) by mouth 2 (two) times daily.  60 tablet  3  . nitroGLYCERIN (NITROSTAT) 0.4 MG SL tablet Place 1 tablet (0.4 mg total) under the tongue every 5 (five) minutes as needed for chest pain (up to 3 doses).  25 tablet  3  . potassium chloride SA (K-DUR,KLOR-CON) 20 MEQ tablet Take  2 tablets (40 mEq total) by mouth daily.  60 tablet  5  . simvastatin (ZOCOR) 10 MG tablet Take 10 mg by mouth every morning.      Marland Kitchen spironolactone (ALDACTONE) 25 MG tablet Take 25 mg by mouth every morning.      . tamsulosin (FLOMAX) 0.4 MG CAPS capsule Take 0.4 mg by mouth daily.      Marland Kitchen ULORIC 40 MG tablet TAKE 1 TABLET DAILY  30 tablet  2  . warfarin (COUMADIN) 2.5 MG tablet TAKE DAILY AS DIRECTED BY CPP  52 tablet  2   No current facility-administered medications for this visit.    Past Medical History  Diagnosis Date  . HTN (hypertension)   . Hyperlipidemia   . PVCs (premature ventricular contractions)   . Glaucoma   . PVD (peripheral vascular disease)     Right ABI .75, Left .78 (2006)  . Popliteal artery aneurysm, bilateral DECUMENTED CHRONIC PARTIAL OCCLUSION--  PT DENIES CLAUDICATION OR ANY OTHER SYMPTOMS  . Frequency of urination   . Nocturia   . Bladder cancer 03/15/2012  . Chronic systolic CHF (congestive heart failure)     a. NICM - patent cors 06/2012, EF 40% at that time. b. 03/2013 eval: EF 20-25%.  . DVT,  bilateral lower limbs 03/2013    a. Bilat DVT dx 03/2013.  . Pulmonary embolism     a. By CT angio 04/2013.  Marland Kitchen Elevated troponin     a. Adm 12/30-04/2013 - not felt to represent ACS; ?due to PE. b. Patent cors 06/2012.  Marland Kitchen NSVT (nonsustained ventricular tachycardia)     a. NSVT 06/2012; NSVT also seen during 03/2013 adm. b. Med rx. Not candidate for ICD given adv age.  Marland Kitchen ETOH abuse   . Syncope     a. Felt to be postural syncope related to diuretics 06/2012.  Marland Kitchen Heart murmur   . DVT (deep venous thrombosis)     "I think in both legs"  . Pneumonia     "couple times"  . DDD (degenerative disc disease)   . Arthritis     "all over"  . Chronic back pain   . Depression     ROS:  As stated in the HPI and negative for all other systems.   PHYSICAL EXAM BP 90/50  Pulse 60  Ht 5' 6.5" (1.689 m)  Wt 150 lb (68.04 kg)  BMI 23.85 kg/m2 GENERAL:  Frail  appearing NECK:  No jugular venous distention, waveform within normal limits, carotid upstroke brisk and symmetric, no bruits, no thyromegaly LUNGS:  Clear to auscultation bilaterally BACK:  No CVA tenderness CHEST:  Unremarkable HEART:  PMI not displaced or sustained,S1 and S2 within normal limits, no S3, no S4, no clicks, no rubs, no murmurs ABD:  Flat, positive bowel sounds normal in frequency in pitch, no bruits, no rebound, no guarding, no midline pulsatile mass, no hepatomegaly, no splenomegaly EXT:  2 plus pulses upper and femorals.  Mild ankle  edema, no cyanosis no clubbing  EKG:  Sinus rhythm, rate 67, left bundle branch block, left axis deviation, premature ventricular contractions. 10/18/13  ASSESSMENT AND PLAN  Diastolic heart failure - His weight is up for home health nursing. However, he's not having any overt evidence of left-sided heart failure and breathing much better than he was when he was hospitalized recently. Given this I would not increase the dose of diuretic particularly since he has low blood pressure. Of note given his runny nose which could be related to his ACE inhibitor I will switch him to Cozaar 25 mg daily.  PVCs (premature ventricular contractions) -  He is having no symptoms related to this.  HTN (hypertension) -  This is actually running low.  We will keep an eye on this.  DVT/PULMONARY EMBOLI-  INR is followed closely by Dr. Laurance Flatten.

## 2013-11-27 NOTE — Patient Instructions (Signed)
Your physician recommends that you schedule a follow-up appointment in:  3 months with Dr. Jenkins Rouge at the  Office  Stop taking your Lisinopril  Start taking Cozaar 25 mg daily  ;  I have sent a script to your pharmacy

## 2013-11-28 ENCOUNTER — Telehealth: Payer: Self-pay | Admitting: Internal Medicine

## 2013-11-28 NOTE — Telephone Encounter (Signed)
[place] home health nurse with [facility], needs to know if weekly BMETs are to continued to be drawn on this pt. She states that she is scheduled to go to his home tomorrow and needs an answer concerning BMET draw. Will forward note to Dr Percival Spanish and his nurse [place].

## 2013-11-28 NOTE — Telephone Encounter (Signed)
New message     Do they need to continue to draw a weekly BMET?

## 2013-11-29 ENCOUNTER — Telehealth: Payer: Self-pay | Admitting: *Deleted

## 2013-11-29 DIAGNOSIS — F0390 Unspecified dementia without behavioral disturbance: Secondary | ICD-10-CM | POA: Diagnosis not present

## 2013-11-29 DIAGNOSIS — I509 Heart failure, unspecified: Secondary | ICD-10-CM | POA: Diagnosis not present

## 2013-11-29 DIAGNOSIS — I5023 Acute on chronic systolic (congestive) heart failure: Secondary | ICD-10-CM | POA: Diagnosis not present

## 2013-11-29 DIAGNOSIS — I214 Non-ST elevation (NSTEMI) myocardial infarction: Secondary | ICD-10-CM | POA: Diagnosis not present

## 2013-11-29 DIAGNOSIS — I10 Essential (primary) hypertension: Secondary | ICD-10-CM | POA: Diagnosis not present

## 2013-11-29 DIAGNOSIS — I428 Other cardiomyopathies: Secondary | ICD-10-CM | POA: Diagnosis not present

## 2013-11-29 NOTE — Telephone Encounter (Signed)
Home health nurse wants to know if she needs to continue weekly BMP on [employer]

## 2013-11-29 NOTE — Telephone Encounter (Signed)
Called Kenney Houseman at [facility] with new order and called [facility] with new order, they fill his pill box weekly

## 2013-11-29 NOTE — Telephone Encounter (Signed)
Scott [name] is advised and she verbalized understanding.

## 2013-11-29 NOTE — Telephone Encounter (Signed)
Protime 19.9  INR 1.7, he takes &.5 mg on M,W,F and 5 mg all other days

## 2013-11-29 NOTE — Telephone Encounter (Signed)
No

## 2013-11-29 NOTE — Telephone Encounter (Signed)
If he is currently taking 7.5 on Monday Wednesday and Friday and 5 mg all the other days reverse sequence and having to 5 mg on Monday Wednesday and Friday and 7.5 all other days, get a pro time in one week

## 2013-12-04 ENCOUNTER — Other Ambulatory Visit: Payer: Self-pay | Admitting: *Deleted

## 2013-12-04 DIAGNOSIS — I428 Other cardiomyopathies: Secondary | ICD-10-CM

## 2013-12-04 NOTE — Progress Notes (Signed)
DNR scanned into patients chart 

## 2013-12-06 ENCOUNTER — Ambulatory Visit: Payer: Self-pay | Admitting: Pharmacist

## 2013-12-06 ENCOUNTER — Telehealth: Payer: Self-pay | Admitting: *Deleted

## 2013-12-06 DIAGNOSIS — I10 Essential (primary) hypertension: Secondary | ICD-10-CM | POA: Diagnosis not present

## 2013-12-06 DIAGNOSIS — I214 Non-ST elevation (NSTEMI) myocardial infarction: Secondary | ICD-10-CM | POA: Diagnosis not present

## 2013-12-06 DIAGNOSIS — I428 Other cardiomyopathies: Secondary | ICD-10-CM | POA: Diagnosis not present

## 2013-12-06 DIAGNOSIS — I2699 Other pulmonary embolism without acute cor pulmonale: Secondary | ICD-10-CM

## 2013-12-06 DIAGNOSIS — I509 Heart failure, unspecified: Secondary | ICD-10-CM | POA: Diagnosis not present

## 2013-12-06 DIAGNOSIS — I5023 Acute on chronic systolic (congestive) heart failure: Secondary | ICD-10-CM | POA: Diagnosis not present

## 2013-12-06 DIAGNOSIS — I82403 Acute embolism and thrombosis of unspecified deep veins of lower extremity, bilateral: Secondary | ICD-10-CM

## 2013-12-06 DIAGNOSIS — F0390 Unspecified dementia without behavioral disturbance: Secondary | ICD-10-CM | POA: Diagnosis not present

## 2013-12-06 LAB — POCT INR: INR: 1.4

## 2013-12-06 NOTE — Telephone Encounter (Signed)
Protime 17.1  INR 1.4  Today was [facility] last day, he will need to get next INR here. Takes 5 mg M,W,F and 7.5 mg all other days

## 2013-12-06 NOTE — Progress Notes (Signed)
[place] with [facility] advised of warfarin dose change.  Also called care giver.

## 2013-12-14 ENCOUNTER — Ambulatory Visit (INDEPENDENT_AMBULATORY_CARE_PROVIDER_SITE_OTHER): Payer: Medicare Other | Admitting: Pharmacist

## 2013-12-14 DIAGNOSIS — I82409 Acute embolism and thrombosis of unspecified deep veins of unspecified lower extremity: Secondary | ICD-10-CM

## 2013-12-14 DIAGNOSIS — I2699 Other pulmonary embolism without acute cor pulmonale: Secondary | ICD-10-CM | POA: Diagnosis not present

## 2013-12-14 DIAGNOSIS — I82403 Acute embolism and thrombosis of unspecified deep veins of lower extremity, bilateral: Secondary | ICD-10-CM

## 2013-12-14 LAB — POCT INR: INR: 1.7

## 2013-12-14 NOTE — Patient Instructions (Signed)
Anticoagulation Dose Instructions as of 12/14/2013     Dorene Grebe Tue Wed Thu Fri Sat   New Dose 7.5 mg 7.5 mg 7.5 mg 7.5 mg 7.5 mg 7.5 mg 7.5 mg    Description       10 mg for 1 day, then increase to 7.5mg  daily.      INR was 1.7 today

## 2013-12-26 ENCOUNTER — Ambulatory Visit: Payer: Medicare Other | Admitting: Cardiology

## 2013-12-27 ENCOUNTER — Other Ambulatory Visit: Payer: Self-pay | Admitting: Family Medicine

## 2013-12-27 ENCOUNTER — Telehealth: Payer: Self-pay | Admitting: Family Medicine

## 2013-12-27 ENCOUNTER — Encounter: Payer: Self-pay | Admitting: Family Medicine

## 2013-12-27 ENCOUNTER — Ambulatory Visit (INDEPENDENT_AMBULATORY_CARE_PROVIDER_SITE_OTHER): Payer: Medicare Other | Admitting: Family Medicine

## 2013-12-27 VITALS — BP 116/66 | HR 64 | Temp 98.2°F | Ht 66.5 in | Wt 154.0 lb

## 2013-12-27 DIAGNOSIS — R229 Localized swelling, mass and lump, unspecified: Secondary | ICD-10-CM

## 2013-12-27 DIAGNOSIS — R269 Unspecified abnormalities of gait and mobility: Secondary | ICD-10-CM | POA: Diagnosis not present

## 2013-12-27 DIAGNOSIS — R42 Dizziness and giddiness: Secondary | ICD-10-CM

## 2013-12-27 DIAGNOSIS — I2510 Atherosclerotic heart disease of native coronary artery without angina pectoris: Secondary | ICD-10-CM | POA: Diagnosis not present

## 2013-12-27 DIAGNOSIS — I82409 Acute embolism and thrombosis of unspecified deep veins of unspecified lower extremity: Secondary | ICD-10-CM

## 2013-12-27 DIAGNOSIS — R2241 Localized swelling, mass and lump, right lower limb: Secondary | ICD-10-CM

## 2013-12-27 DIAGNOSIS — J0110 Acute frontal sinusitis, unspecified: Secondary | ICD-10-CM

## 2013-12-27 DIAGNOSIS — I82403 Acute embolism and thrombosis of unspecified deep veins of lower extremity, bilateral: Secondary | ICD-10-CM

## 2013-12-27 DIAGNOSIS — I2699 Other pulmonary embolism without acute cor pulmonale: Secondary | ICD-10-CM

## 2013-12-27 DIAGNOSIS — J0111 Acute recurrent frontal sinusitis: Secondary | ICD-10-CM

## 2013-12-27 LAB — POCT INR
INR: 2.5
INR: 2.5

## 2013-12-27 MED ORDER — MECLIZINE HCL 32 MG PO TABS: 32.0000 mg | ORAL_TABLET | Freq: Three times a day (TID) | ORAL | Status: DC | PRN

## 2013-12-27 MED ORDER — FLUTICASONE PROPIONATE 50 MCG/ACT NA SUSP: 2.0000 | Freq: Every day | NASAL | Status: DC

## 2013-12-27 MED ORDER — AMOXICILLIN 875 MG PO TABS: 875.0000 mg | ORAL_TABLET | Freq: Two times a day (BID) | ORAL | Status: DC

## 2013-12-27 NOTE — Progress Notes (Signed)
   Subjective:    Patient ID: Scott [name], male    DOB: January 20, 1922, 80 y.o.   MRN: [id number]  HPI Patient is here for c/o dizziness and uri sx's.  He has falls and is having difficulty with ambulation. He has hx of DVT and is taking coumadin and needs his INR today.  He has a mass on his right thigh that is getting larger.   Review of Systems No chest pain, SOB, HA, dizziness, vision change, N/V, diarrhea, constipation, dysuria, urinary urgency or frequency, myalgias, arthralgias or rash.     Objective:   Physical Exam  Vital signs noted  Well developed well nourished male.  HEENT - Head atraumatic Normocephalic                Eyes - PERRLA, Conjuctiva - clear Sclera- Clear EOMI                Ears - EAC's Wnl TM's Wnl Gross Hearing WNL                Throat - oropharanx wnl Respiratory - Lungs CTA bilateral Cardiac - RRR S1 and S2 without murmur GI - Abdomen soft Nontender and bowel sounds active x 4 Extremities - No edema. Neuro - Grossly intact. Skin - Lipoma mass right thigh Results for orders placed in visit on 12/27/13  POCT INR      Result Value Ref Range   INR 2.5        Assessment & Plan:  DVT of lower extremity, bilateral - Plan: POCT INR  Gait disturbance - Plan: Ambulatory referral to Physical Therapy, fluticasone (FLONASE) 50 MCG/ACT nasal spray  Vertigo - Plan: Ambulatory referral to Physical Therapy, meclizine (ANTIVERT) 32 MG tablet  Acute recurrent frontal sinusitis - Plan: amoxicillin (AMOXIL) 875 MG tablet  Mass of right thigh - Plan: [place] Misc Soft Tissue  William J Oxford FNP

## 2013-12-27 NOTE — Patient Instructions (Signed)
Anticoagulation Dose Instructions as of 12/27/2013     Dorene Grebe Tue Wed Thu Fri Sat   New Dose 7.5 mg 7.5 mg 7.5 mg 7.5 mg 7.5 mg 7.5 mg 7.5 mg    Description       Continue current regimen and follow up in 2 weeks

## 2013-12-27 NOTE — Telephone Encounter (Signed)
Appt given for today 

## 2013-12-28 ENCOUNTER — Ambulatory Visit: Payer: Medicare Other | Admitting: Urology

## 2014-01-07 ENCOUNTER — Ambulatory Visit (HOSPITAL_COMMUNITY): Payer: Medicare Other

## 2014-01-07 ENCOUNTER — Ambulatory Visit: Payer: Medicare Other | Attending: Family Medicine | Admitting: Physical Therapy

## 2014-01-07 DIAGNOSIS — R269 Unspecified abnormalities of gait and mobility: Secondary | ICD-10-CM | POA: Insufficient documentation

## 2014-01-07 DIAGNOSIS — R42 Dizziness and giddiness: Secondary | ICD-10-CM | POA: Insufficient documentation

## 2014-01-07 DIAGNOSIS — IMO0001 Reserved for inherently not codable concepts without codable children: Secondary | ICD-10-CM | POA: Insufficient documentation

## 2014-01-10 ENCOUNTER — Encounter: Payer: Medicare Other | Admitting: *Deleted

## 2014-01-14 ENCOUNTER — Ambulatory Visit (HOSPITAL_COMMUNITY): Admission: RE | Admit: 2014-01-14 | Payer: Medicare Other | Source: Ambulatory Visit

## 2014-01-18 ENCOUNTER — Other Ambulatory Visit: Payer: Self-pay | Admitting: Family Medicine

## 2014-01-25 ENCOUNTER — Other Ambulatory Visit: Payer: Self-pay | Admitting: Family Medicine

## 2014-01-25 ENCOUNTER — Other Ambulatory Visit: Payer: Self-pay | Admitting: Physician Assistant

## 2014-01-28 ENCOUNTER — Ambulatory Visit: Payer: Medicare Other | Attending: Family Medicine | Admitting: Physical Therapy

## 2014-01-28 DIAGNOSIS — R269 Unspecified abnormalities of gait and mobility: Secondary | ICD-10-CM | POA: Insufficient documentation

## 2014-01-28 DIAGNOSIS — R42 Dizziness and giddiness: Secondary | ICD-10-CM | POA: Diagnosis not present

## 2014-01-28 DIAGNOSIS — Z5189 Encounter for other specified aftercare: Secondary | ICD-10-CM | POA: Diagnosis not present

## 2014-01-30 ENCOUNTER — Telehealth: Payer: Self-pay | Admitting: Pharmacist

## 2014-01-30 NOTE — Telephone Encounter (Signed)
protime appt needed.  Appt made for tomorrow 01/31/14.  Patient to come in either before or after Physcial therapy appt.  Patient's son notified.  I also called to notify patient but he was asleep per inhome care given.  Left message with appt information.

## 2014-01-31 ENCOUNTER — Ambulatory Visit: Payer: Medicare Other | Admitting: Physical Therapy

## 2014-01-31 ENCOUNTER — Ambulatory Visit (INDEPENDENT_AMBULATORY_CARE_PROVIDER_SITE_OTHER): Payer: Medicare Other

## 2014-01-31 ENCOUNTER — Ambulatory Visit (INDEPENDENT_AMBULATORY_CARE_PROVIDER_SITE_OTHER): Payer: Medicare Other | Admitting: Pharmacist

## 2014-01-31 DIAGNOSIS — I2699 Other pulmonary embolism without acute cor pulmonale: Secondary | ICD-10-CM

## 2014-01-31 DIAGNOSIS — Z23 Encounter for immunization: Secondary | ICD-10-CM | POA: Diagnosis not present

## 2014-01-31 DIAGNOSIS — I82403 Acute embolism and thrombosis of unspecified deep veins of lower extremity, bilateral: Secondary | ICD-10-CM

## 2014-01-31 LAB — POCT INR: INR: 1.9

## 2014-01-31 NOTE — Patient Instructions (Signed)
Anticoagulation Dose Instructions as of 01/31/2014     Dorene Grebe Tue Wed Thu Fri Sat   New Dose 7.5 mg 7.5 mg 7.5 mg 7.5 mg 7.5 mg 7.5 mg 7.5 mg    Description       Continue warfarin 3 tablets daily = 7.5mg .      INR was 1.9 today

## 2014-02-06 ENCOUNTER — Encounter: Payer: Self-pay | Admitting: Cardiology

## 2014-02-06 ENCOUNTER — Ambulatory Visit (INDEPENDENT_AMBULATORY_CARE_PROVIDER_SITE_OTHER): Payer: Medicare Other | Admitting: Cardiology

## 2014-02-06 VITALS — BP 90/48 | HR 79 | Ht 67.0 in | Wt 157.0 lb

## 2014-02-06 DIAGNOSIS — I2510 Atherosclerotic heart disease of native coronary artery without angina pectoris: Secondary | ICD-10-CM | POA: Diagnosis not present

## 2014-02-06 NOTE — Progress Notes (Signed)
=    HPI Mr. Bouffard is a 80 y.o.male with NICM (06/2012 cath with patent cors - EF 40% then, more recently 20-25%), HTN, chronic LBBB, postural syncope related to diuretics, EtOH abuse, and DVT.   Since he was last seen his had no acute problems. He iss not here with his son however so I'm not sure, to the history his providing me.  However, he says he's had no presyncope or syncope. He denies any chest pressure, neck or arm discomfort. He has no shortness of breath, PND or orthopnea. He does have some mild orthostasis reports that he is careful with this.  Allergies  Allergen Reactions  . Lipitor [Atorvastatin] Other (See Comments)    unknown    Current Outpatient Prescriptions  Medication Sig Dispense Refill  . donepezil (ARICEPT) 10 MG tablet TAKE ONE TABLET AT BEDTIME  30 tablet  3  . fluticasone (FLONASE) 50 MCG/ACT nasal spray Place 2 sprays into both nostrils daily.  16 g  6  . furosemide (LASIX) 40 MG tablet 40 MG ON MON, WED, AND FRI'S; THEN 20 MG ALL OTHER DAYS  30 tablet  5  . gabapentin (NEURONTIN) 100 MG capsule Take 200 mg by mouth at bedtime.      . isosorbide mononitrate (IMDUR) 30 MG 24 hr tablet Take 0.5 tablets (15 mg total) by mouth daily.  30 tablet  5  . losartan (COZAAR) 25 MG tablet Take 1 tablet (25 mg total) by mouth daily.  90 tablet  3  . megestrol (MEGACE) 20 MG tablet TAKE 1 TABLET ONCE A DAY  30 tablet  3  . metoprolol succinate (TOPROL-XL) 50 MG 24 hr tablet TAKE  (1)  TABLET TWICE A DAY.  60 tablet  0  . nitroGLYCERIN (NITROSTAT) 0.4 MG SL tablet Place 1 tablet (0.4 mg total) under the tongue every 5 (five) minutes as needed for chest pain (up to 3 doses).  25 tablet  3  . potassium chloride SA (K-DUR,KLOR-CON) 20 MEQ tablet Take 2 tablets (40 mEq total) by mouth daily.  60 tablet  5  . simvastatin (ZOCOR) 10 MG tablet Take 10 mg by mouth every morning.      Marland Kitchen spironolactone (ALDACTONE) 25 MG tablet Take 25 mg by mouth every morning.      . tamsulosin  (FLOMAX) 0.4 MG CAPS capsule Take 0.4 mg by mouth daily.      Marland Kitchen ULORIC 40 MG tablet TAKE 1 TABLET DAILY  30 tablet  4  . warfarin (COUMADIN) 2.5 MG tablet TAKE DAILY AS DIRECTED BY CPP  52 tablet  2  . meclizine (ANTIVERT) 32 MG tablet Take 1 tablet (32 mg total) by mouth 3 (three) times daily as needed.  30 tablet  0   No current facility-administered medications for this visit.    Past Medical History  Diagnosis Date  . HTN (hypertension)   . Hyperlipidemia   . PVCs (premature ventricular contractions)   . Glaucoma   . PVD (peripheral vascular disease)     Right ABI .75, Left .78 (2006)  . Popliteal artery aneurysm, bilateral DECUMENTED CHRONIC PARTIAL OCCLUSION--  PT DENIES CLAUDICATION OR ANY OTHER SYMPTOMS  . Frequency of urination   . Nocturia   . Bladder cancer 03/15/2012  . Chronic systolic CHF (congestive heart failure)     a. NICM - patent cors 06/2012, EF 40% at that time. b. 03/2013 eval: EF 20-25%.  . DVT, bilateral lower limbs 03/2013    a. Bilat  DVT dx 03/2013.  . Pulmonary embolism     a. By CT angio 04/2013.  Marland Kitchen Elevated troponin     a. Adm 12/30-04/2013 - not felt to represent ACS; ?due to PE. b. Patent cors 06/2012.  Marland Kitchen NSVT (nonsustained ventricular tachycardia)     a. NSVT 06/2012; NSVT also seen during 03/2013 adm. b. Med rx. Not candidate for ICD given adv age.  Marland Kitchen ETOH abuse   . Syncope     a. Felt to be postural syncope related to diuretics 06/2012.  Marland Kitchen Heart murmur   . DVT (deep venous thrombosis)     "I think in both legs"  . Pneumonia     "couple times"  . DDD (degenerative disc disease)   . Arthritis     "all over"  . Chronic back pain   . Depression     ROS:  As stated in the HPI and negative for all other systems.   PHYSICAL EXAM BP 90/48  Pulse 79  Ht 5\' 7"  (1.702 m)  Wt 157 lb (71.215 kg)  BMI 24.58 kg/m2 GENERAL:  Frail appearing NECK:  No jugular venous distention, waveform within normal limits, carotid upstroke brisk and symmetric, no  bruits, no thyromegaly LUNGS:  Clear to auscultation bilaterally CHEST:  Unremarkable HEART:  PMI not displaced or sustained,S1 and S2 within normal limits, no S3, no S4, no clicks, no rubs, no murmurs ABD:  Flat, positive bowel sounds normal in frequency in pitch, no bruits, no rebound, no guarding, no midline pulsatile mass, no hepatomegaly, no splenomegaly EXT:  2 plus pulses upper and femorals.  Mild ankle  edema, no cyanosis no clubbing  EKG:  Sinus rhythm, rate 79, left bundle branch block, left axis deviation.  02/06/2014  ASSESSMENT AND PLAN  Diastolic heart failure - He seems to be euvolemic. Of note I didn't change in the Cozaar last time thinking his inhibitors might indeed for allowing [place] but he didn't seem to make a difference. He will continue with the meds as listed.  PVCs (premature ventricular contractions) -  He is having no symptoms related to this.  HTN (hypertension) -  This is actually running low but he has no symptoms related to this. No change in therapy is indicated.  DVT/PULMONARY EMBOLI-  INR is followed closely by Dr. Laurance Flatten.

## 2014-02-06 NOTE — Patient Instructions (Signed)
The current medical regimen is effective;  continue present plan and medications.  Follow up in 6 months with Dr Hochrein.  You will receive a letter in the mail 2 months before you are due.  Please call us when you receive this letter to schedule your follow up appointment.  

## 2014-02-20 ENCOUNTER — Other Ambulatory Visit: Payer: Self-pay | Admitting: Cardiology

## 2014-02-28 ENCOUNTER — Other Ambulatory Visit: Payer: Self-pay | Admitting: Family Medicine

## 2014-03-20 ENCOUNTER — Other Ambulatory Visit: Payer: Self-pay | Admitting: Family Medicine

## 2014-03-20 NOTE — Telephone Encounter (Signed)
Last ov 9/15. 

## 2014-03-22 ENCOUNTER — Ambulatory Visit: Payer: Medicare Other | Admitting: Family Medicine

## 2014-03-26 ENCOUNTER — Encounter: Payer: Self-pay | Admitting: Family Medicine

## 2014-03-26 ENCOUNTER — Ambulatory Visit (INDEPENDENT_AMBULATORY_CARE_PROVIDER_SITE_OTHER): Payer: Medicare Other

## 2014-03-26 ENCOUNTER — Ambulatory Visit (INDEPENDENT_AMBULATORY_CARE_PROVIDER_SITE_OTHER): Payer: Medicare Other | Admitting: Family Medicine

## 2014-03-26 VITALS — BP 133/77 | HR 79 | Temp 98.5°F | Ht 67.0 in | Wt 159.0 lb

## 2014-03-26 DIAGNOSIS — G629 Polyneuropathy, unspecified: Secondary | ICD-10-CM

## 2014-03-26 DIAGNOSIS — M545 Low back pain, unspecified: Secondary | ICD-10-CM

## 2014-03-26 DIAGNOSIS — R064 Hyperventilation: Secondary | ICD-10-CM | POA: Diagnosis not present

## 2014-03-26 DIAGNOSIS — I4891 Unspecified atrial fibrillation: Secondary | ICD-10-CM | POA: Diagnosis not present

## 2014-03-26 DIAGNOSIS — I2510 Atherosclerotic heart disease of native coronary artery without angina pectoris: Secondary | ICD-10-CM | POA: Diagnosis not present

## 2014-03-26 DIAGNOSIS — J301 Allergic rhinitis due to pollen: Secondary | ICD-10-CM | POA: Diagnosis not present

## 2014-03-26 DIAGNOSIS — I82409 Acute embolism and thrombosis of unspecified deep veins of unspecified lower extremity: Secondary | ICD-10-CM | POA: Diagnosis not present

## 2014-03-26 DIAGNOSIS — I2699 Other pulmonary embolism without acute cor pulmonale: Secondary | ICD-10-CM

## 2014-03-26 DIAGNOSIS — R0689 Other abnormalities of breathing: Secondary | ICD-10-CM | POA: Diagnosis not present

## 2014-03-26 DIAGNOSIS — E559 Vitamin D deficiency, unspecified: Secondary | ICD-10-CM

## 2014-03-26 DIAGNOSIS — I82403 Acute embolism and thrombosis of unspecified deep veins of lower extremity, bilateral: Secondary | ICD-10-CM

## 2014-03-26 DIAGNOSIS — R269 Unspecified abnormalities of gait and mobility: Secondary | ICD-10-CM

## 2014-03-26 DIAGNOSIS — R350 Frequency of micturition: Secondary | ICD-10-CM

## 2014-03-26 DIAGNOSIS — I10 Essential (primary) hypertension: Secondary | ICD-10-CM | POA: Diagnosis not present

## 2014-03-26 DIAGNOSIS — J3489 Other specified disorders of nose and nasal sinuses: Secondary | ICD-10-CM

## 2014-03-26 DIAGNOSIS — E785 Hyperlipidemia, unspecified: Secondary | ICD-10-CM | POA: Diagnosis not present

## 2014-03-26 LAB — POCT INR: INR: 1.3

## 2014-03-26 MED ORDER — FLUTICASONE PROPIONATE 50 MCG/ACT NA SUSP: 2.0000 | Freq: Every day | NASAL | Status: DC

## 2014-03-26 NOTE — Progress Notes (Signed)
Subjective:    Patient ID: Scott [name], male    DOB: [birth year], 80 y.o.   MRN: [id number]  HPI Pt here for follow up and management of chronic medical problems. The son comes with him to the visit today. He is concerned because he's noticed his father seems to be having more labored breathing at times. The father specifically did not complain of breathing issues and indicates that he does not have PND as he sleeps well at night time other than getting up to void. He has clear nasal discharge and has had this for a long time. The patient complains of aching in his back and both feet and legs aching and itching. He also has frequent urination worse during the day and has to get up 2 or 3 times at night to use the bathroom to void.        Patient Active Problem List   Diagnosis Date Noted  . NSTEMI (non-ST elevated myocardial infarction) 10/09/2013  . High risk medication use 05/17/2013  . BPH (benign prostatic hyperplasia) 05/17/2013  . Vitamin D deficiency 05/17/2013  . Chronic anticoagulation- subtheraputic INR on admission 04/19/2013  . Acute pulmonary embolism 04/17/2013  . NSVT (nonsustained ventricular tachycardia)- not an ICD candidate 04/17/2013  . NICM (nonischemic cardiomyopathy)- EF 20-25% by Echo 04/12/13 04/17/2013  . Elevated troponin 04/10/2013  . Chronic systolic heart failure 41/58/3094  . DVT of lower extremity, bilateral 03/15/2013  . Fatigue 11/02/2012  . Constipation 11/02/2012  . Alcohol abuse 08/27/2012  . Syncope 06/21/2012  . Bladder cancer 03/15/2012  . Benign localized hyperplasia of prostate with urinary obstruction 03/15/2012  . HTN (hypertension)   . Hyperlipidemia   . PVCs (premature ventricular contractions)   . CAD- non obstructive disease by cath 3/14    Outpatient Encounter Prescriptions as of 03/26/2014  Medication Sig  . donepezil (ARICEPT) 10 MG tablet TAKE ONE TABLET AT BEDTIME  . fluticasone (FLONASE) 50 MCG/ACT nasal spray Place 2  sprays into both nostrils daily.  . furosemide (LASIX) 40 MG tablet 40 MG ON MON, WED, AND FRI'S; THEN 20 MG ALL OTHER DAYS  . gabapentin (NEURONTIN) 100 MG capsule Take 200 mg by mouth at bedtime.  . isosorbide mononitrate (IMDUR) 30 MG 24 hr tablet Take 0.5 tablets (15 mg total) by mouth daily.  Marland Kitchen losartan (COZAAR) 25 MG tablet Take 1 tablet (25 mg total) by mouth daily.  . megestrol (MEGACE) 20 MG tablet TAKE 1 TABLET ONCE A DAY  . metoprolol succinate (TOPROL-XL) 50 MG 24 hr tablet TAKE (1) TABLET TWICE A DAY.  Marland Kitchen potassium chloride SA (K-DUR,KLOR-CON) 20 MEQ tablet Take 2 tablets (40 mEq total) by mouth daily.  . simvastatin (ZOCOR) 10 MG tablet Take 10 mg by mouth every morning.  Marland Kitchen spironolactone (ALDACTONE) 25 MG tablet Take 25 mg by mouth every morning.  . tamsulosin (FLOMAX) 0.4 MG CAPS capsule Take 0.4 mg by mouth daily.  Marland Kitchen ULORIC 40 MG tablet TAKE 1 TABLET DAILY  . warfarin (COUMADIN) 2.5 MG tablet TAKE UP TO 3 TABLETS DAILY AS DIRECTED BY CPP  . [DISCONTINUED] meclizine (ANTIVERT) 32 MG tablet Take 1 tablet (32 mg total) by mouth 3 (three) times daily as needed.  . nitroGLYCERIN (NITROSTAT) 0.4 MG SL tablet Place 1 tablet (0.4 mg total) under the tongue every 5 (five) minutes as needed for chest pain (up to 3 doses). (Patient not taking: Reported on 03/26/2014)    Review of Systems  Constitutional: Negative.   HENT:  Positive for postnasal drip.   Eyes: Negative.   Respiratory: Positive for shortness of breath (labored breathing).   Cardiovascular: Negative.   Gastrointestinal: Negative.   Endocrine: Negative.   Genitourinary: Positive for frequency.  Musculoskeletal: Positive for myalgias (bilat. lower leg and foot aches and itching) and back pain.  Skin: Negative.   Allergic/Immunologic: Negative.   Neurological: Negative.  Dizziness: better.  Hematological: Negative.   Psychiatric/Behavioral: Negative.        Objective:   Physical Exam  Constitutional: He is oriented  to person, place, and time. He appears well-developed and well-nourished.  Elderly and alert and pleasant  HENT:  Head: Normocephalic and atraumatic.  Right Ear: External ear normal.  Left Ear: External ear normal.  Mouth/Throat: Oropharynx is clear and moist. No oropharyngeal exudate.  Rhinorrhea with some nasal congestion  Eyes: Conjunctivae and EOM are normal. Pupils are equal, round, and reactive to light. Right eye exhibits no discharge. Left eye exhibits no discharge. No scleral icterus.  Neck: Normal range of motion. Neck supple. No thyromegaly present.  No carotid bruits or anterior cervical adenopathy  Cardiovascular: Normal rate, regular rhythm and normal heart sounds.  Exam reveals no gallop and no friction rub.   No murmur heard. Distal pulses in the feet were difficult to palpate. Heart rhythm appeared regular today between 72 and 84/m  Pulmonary/Chest: Effort normal and breath sounds normal. No respiratory distress. He has no wheezes. He has no rales. He exhibits no tenderness.  The lungs appear to be clear anteriorly and posteriorly  Abdominal: Soft. Bowel sounds are normal. He exhibits no mass. There is no tenderness. There is no rebound and no guarding.  Musculoskeletal: Normal range of motion. He exhibits no edema or tenderness.  Lymphadenopathy:    He has no cervical adenopathy.  Neurological: He is alert and oriented to person, place, and time. No cranial nerve deficit.  Skin: Skin is warm and dry. No rash noted. No erythema. No pallor.  Psychiatric: He has a normal mood and affect. His behavior is normal. Judgment and thought content normal.  Nursing note and vitals reviewed.  BP 133/77 mmHg  Pulse 79  Temp(Src) 98.5 F (36.9 C) (Oral)  Ht _0  (1.702 m)  Wt 159 lb (72.122 kg)  BMI 24.90 kg/m2  The pulse ox was 98%.  [facility] reading (PRIMARY) by  Dr.Magaby Rumberger-chest x-ray and LS spine--   severe degenerative changes in the lumbar spine with disc degeneration and no  active disease in the chest with degenerative disc in the back                                    Assessment & Plan:  1. Essential hypertension - BMP8+EGFR - Hepatic function panel - CBC With differential/Platelet  2. Atrial fibrillation, unspecified - CBC With differential/Platelet  3. Hyperlipidemia - NMR, lipoprofile  4. Peripheral neuropathy  5. Vitamin D deficiency - Vit D  25 hydroxy (rtn osteoporosis monitoring)  6. DVT of lower extremity, bilateral - POCT INR  7. Allergic rhinitis due to pollen -Trial of Flonase  8. Midline low back pain without sciatica  9. Urinary frequency  10. Rhinorrhea  11. Labored breathing -Pulse ox 98% -BNP  Patient Instructions                       Medicare Annual Wellness Visit  [facility] and the medical providers at  [facility] strive to bring you the best medical care.  In doing so we not only want to address your current medical conditions and concerns but also to detect new conditions early and prevent illness, disease and health-related problems.    Medicare offers a yearly Wellness Visit which allows our clinical staff to assess your need for preventative services including immunizations, lifestyle education, counseling to decrease risk of preventable diseases and screening for fall risk and other medical concerns.    This visit is provided free of charge (no copay) for all Medicare recipients. The clinical pharmacists at [facility] have begun to conduct these Wellness Visits which will also include a thorough review of all your medications.    As you primary medical provider recommend that you make an appointment for your Annual Wellness Visit if you have not done so already this year.  You may set up this appointment before you leave today or you may call back ([phone]) and schedule an appointment.  Please make sure when you call that you mention that you are scheduling your  Annual Wellness Visit with the clinical pharmacist so that the appointment may be made for the proper length of time.     Continue current medications. Continue good therapeutic lifestyle changes which include good diet and exercise. Fall precautions discussed with patient. If an FOBT was given today- please return it to our front desk. If you are over 78 years old - you may need Prevnar 102 or the adult Pneumonia vaccine.  Flu Shots will be available at our office starting mid- September. Please call and schedule a FLU CLINIC APPOINTMENT.   We will call you with the lab work results once those results are returned We will call you with the urine  report once that is returned Start Flonase 1-2 sprays each nostril daily If the Flonase does not work we will consider trying Atrovent nasal spray on an as-needed basis. If the foot pain and itching do not get better and there is no reason found with the lab work we will consider adding gabapentin to the patient's treatment regimen We will also review the chest x-ray results and call you with those once they are available Please call the urology office and make sure that a follow-up appointment is given to this patient.   Arrie Senate MD

## 2014-03-26 NOTE — Patient Instructions (Addendum)
Medicare Annual Wellness Visit  [facility] and the medical providers at [facility] strive to bring you the best medical care.  In doing so we not only want to address your current medical conditions and concerns but also to detect new conditions early and prevent illness, disease and health-related problems.    Medicare offers a yearly Wellness Visit which allows our clinical staff to assess your need for preventative services including immunizations, lifestyle education, counseling to decrease risk of preventable diseases and screening for fall risk and other medical concerns.    This visit is provided free of charge (no copay) for all Medicare recipients. The clinical pharmacists at [facility] have begun to conduct these Wellness Visits which will also include a thorough review of all your medications.    As you primary medical provider recommend that you make an appointment for your Annual Wellness Visit if you have not done so already this year.  You may set up this appointment before you leave today or you may call back ([phone]) and schedule an appointment.  Please make sure when you call that you mention that you are scheduling your Annual Wellness Visit with the clinical pharmacist so that the appointment may be made for the proper length of time.     Continue current medications. Continue good therapeutic lifestyle changes which include good diet and exercise. Fall precautions discussed with patient. If an FOBT was given today- please return it to our front desk. If you are over 62 years old - you may need Prevnar 1 or the adult Pneumonia vaccine.  Flu Shots will be available at our office starting mid- September. Please call and schedule a FLU CLINIC APPOINTMENT.   We will call you with the lab work results once those results are returned We will call you with the urine  report once that is returned Start Flonase  1-2 sprays each nostril daily If the Flonase does not work we will consider trying Atrovent nasal spray on an as-needed basis. If the foot pain and itching do not get better and there is no reason found with the lab work we will consider adding gabapentin to the patient's treatment regimen We will also review the chest x-ray results and call you with those once they are available Please call the urology office and make sure that a follow-up appointment is given to this patient.

## 2014-03-27 ENCOUNTER — Telehealth: Payer: Self-pay

## 2014-03-27 ENCOUNTER — Telehealth: Payer: Self-pay | Admitting: Pharmacist

## 2014-03-27 LAB — BMP8+EGFR
BUN/Creatinine Ratio: 27 — ABNORMAL HIGH (ref 10–22)
BUN: 36 mg/dL (ref 10–36)
CO2: 19 mmol/L (ref 18–29)
Calcium: 9.7 mg/dL (ref 8.6–10.2)
Chloride: 107 mmol/L (ref 97–108)
Creatinine, Ser: 1.32 mg/dL — ABNORMAL HIGH (ref 0.76–1.27)
GFR calc Af Amer: 55 mL/min/{1.73_m2} — ABNORMAL LOW (ref >59)
GFR calc non Af Amer: 47 mL/min/{1.73_m2} — ABNORMAL LOW (ref >59)
Glucose: 93 mg/dL (ref 65–99)
Potassium: 4.5 mmol/L (ref 3.5–5.2)
Sodium: 139 mmol/L (ref 134–144)

## 2014-03-27 LAB — CBC WITH DIFFERENTIAL
Basophils Absolute: 0 10*3/uL (ref 0.0–0.2)
Basos: 0 %
Eos: 3 %
Eosinophils Absolute: 0.1 10*3/uL (ref 0.0–0.4)
HCT: 35.3 % — ABNORMAL LOW (ref 37.5–51.0)
Hemoglobin: 11.5 g/dL — ABNORMAL LOW (ref 12.6–17.7)
Immature Grans (Abs): 0 10*3/uL (ref 0.0–0.1)
Immature Granulocytes: 0 %
Lymphocytes Absolute: 1.3 10*3/uL (ref 0.7–3.1)
Lymphs: 28 %
MCH: 27.8 pg (ref 26.6–33.0)
MCHC: 32.6 g/dL (ref 31.5–35.7)
MCV: 85 fL (ref 79–97)
Monocytes Absolute: 0.5 10*3/uL (ref 0.1–0.9)
Monocytes: 11 %
Neutrophils Absolute: 2.8 10*3/uL (ref 1.4–7.0)
Neutrophils Relative %: 58 %
Platelets: 176 10*3/uL (ref 150–379)
RBC: 4.14 x10E6/uL (ref 4.14–5.80)
RDW: 15.6 % — ABNORMAL HIGH (ref 12.3–15.4)
WBC: 4.7 10*3/uL (ref 3.4–10.8)

## 2014-03-27 LAB — HEPATIC FUNCTION PANEL
ALT: 9 IU/L (ref 0–44)
AST: 16 IU/L (ref 0–40)
Albumin: 4.1 g/dL (ref 3.2–4.6)
Alkaline Phosphatase: 107 IU/L (ref 39–117)
Bilirubin, Direct: 0.13 mg/dL (ref 0.00–0.40)
Total Bilirubin: 0.4 mg/dL (ref 0.0–1.2)
Total Protein: 6.6 g/dL (ref 6.0–8.5)

## 2014-03-27 LAB — NMR, LIPOPROFILE
Cholesterol: 116 mg/dL (ref 100–199)
HDL Cholesterol by NMR: 35 mg/dL — ABNORMAL LOW (ref >39)
HDL Particle Number: 24.9 umol/L — ABNORMAL LOW (ref >=30.5)
LDL Particle Number: 652 nmol/L (ref <1000)
LDL Size: 19.7 nm (ref >20.5)
LDL-C: 42 mg/dL (ref 0–99)
LP-IR Score: 39 (ref <=45)
Small LDL Particle Number: 449 nmol/L (ref <=527)
Triglycerides by NMR: 194 mg/dL — ABNORMAL HIGH (ref 0–149)

## 2014-03-27 LAB — VITAMIN D 25 HYDROXY (VIT D DEFICIENCY, FRACTURES): Vit D, 25-Hydroxy: 33.1 ng/mL (ref 30.0–100.0)

## 2014-03-27 NOTE — Telephone Encounter (Signed)
Son David aware of results 

## 2014-03-27 NOTE — Telephone Encounter (Signed)
Contacted patient's son - INR low - questioning compliance. Patinet's son to watch more closely to encure patient is taking daily.  Recheck INR 04/08/14

## 2014-03-27 NOTE — Telephone Encounter (Signed)
Son Scott [name] aware of results 

## 2014-03-27 NOTE — Telephone Encounter (Signed)
-----   Message from Chipper Herb, MD sent at 03/27/2014 10:11 AM EST ----- As per radiology report--- please call the patient's son with this report. The x-rays remained stable compared to a previous MRI. He does have anterolisthesis of 1 vertebrae on another. He also has degenerative disc disease. This is most likely responsible for the discomfort in his feet. No further recommendations at this time other than starting the gabapentin which we have discussed.

## 2014-03-27 NOTE — Telephone Encounter (Signed)
LMRc to X-ray

## 2014-03-27 NOTE — Telephone Encounter (Signed)
-----   Message from Chipper Herb, MD sent at 03/27/2014 10:12 AM EST ----- As per radiology report--please call the patient's son, [name] with this result and note the new compression fractures since the prior CT in the mid thoracic spine.

## 2014-03-28 ENCOUNTER — Other Ambulatory Visit: Payer: Self-pay | Admitting: Family Medicine

## 2014-03-28 LAB — BRAIN NATRIURETIC PEPTIDE: BNP: 137.5 pg/mL — ABNORMAL HIGH (ref 0.0–100.0)

## 2014-03-29 ENCOUNTER — Telehealth: Payer: Self-pay

## 2014-03-29 NOTE — Telephone Encounter (Signed)
-----   Message from Chipper Herb, MD sent at 03/28/2014  9:05 PM EST ----- The BNP was slightly elevated and not far from the normal range at 137.5. This is not anything to the extent that it was one year ago.

## 2014-03-29 NOTE — Telephone Encounter (Signed)
Son Scott [name] aware of results

## 2014-04-03 ENCOUNTER — Other Ambulatory Visit: Payer: Self-pay | Admitting: Cardiology

## 2014-04-08 ENCOUNTER — Other Ambulatory Visit: Payer: Self-pay | Admitting: *Deleted

## 2014-04-08 MED ORDER — GABAPENTIN 100 MG PO CAPS: 200.0000 mg | ORAL_CAPSULE | Freq: Two times a day (BID) | ORAL | Status: DC

## 2014-04-08 NOTE — Progress Notes (Unsigned)
Patient's protime was checked today while we were out at the home seeing his wife.  The protime was 3.2 and pt was told to hold coumadin today and that Sharyn Lull would give further notice tomorrow   Sharyn Lull - please advise and call the patient.

## 2014-04-09 ENCOUNTER — Telehealth: Payer: Self-pay | Admitting: Family Medicine

## 2014-04-09 ENCOUNTER — Telehealth: Payer: Self-pay | Admitting: Pharmacist Clinician (PhC)/ Clinical Pharmacy Specialist

## 2014-04-09 NOTE — Telephone Encounter (Signed)
Patient wanted to confirm his doses on warfarin.

## 2014-04-09 NOTE — Telephone Encounter (Signed)
[phone]  Patients home phone to call first.  Changed warfarin to 2 tablets (5mg  qd) except on Mondays, Wednesdays, and Fridays take only 1 tablet.  Re-check in 1 week.  Patient held warfarin yesterday

## 2014-04-19 ENCOUNTER — Telehealth: Payer: Self-pay | Admitting: Pharmacist

## 2014-04-19 NOTE — Telephone Encounter (Signed)
Called patient to make appt to recheck INR - he said he would be able to come in next week but will need to check with his son who brings him.  He will contact son and have him call to schedule appt.

## 2014-04-25 ENCOUNTER — Other Ambulatory Visit: Payer: Self-pay | Admitting: Family Medicine

## 2014-04-26 ENCOUNTER — Ambulatory Visit (INDEPENDENT_AMBULATORY_CARE_PROVIDER_SITE_OTHER): Payer: Medicare Other | Admitting: Pharmacist

## 2014-04-26 DIAGNOSIS — I82403 Acute embolism and thrombosis of unspecified deep veins of lower extremity, bilateral: Secondary | ICD-10-CM

## 2014-04-26 DIAGNOSIS — I2699 Other pulmonary embolism without acute cor pulmonale: Secondary | ICD-10-CM

## 2014-04-26 LAB — POCT INR: INR: 1.5

## 2014-04-26 NOTE — Progress Notes (Signed)
Anticoagulation visit only see notes 

## 2014-04-26 NOTE — Patient Instructions (Signed)
Anticoagulation Dose Instructions as of 04/26/2014      Scott [name] Tue Wed Thu Fri Sat   New Dose 7.5 mg 7.5 mg 7.5 mg 7.5 mg 7.5 mg 10 mg 7.5 mg    Description        Increase warfarin to 2.5 mg - take 4 tablets on fridays and 3 tablets daily = 7.5mg  all other days.  [place] notified.       INR was 1.5 today (a little too thick - goal is 2.0 to 3.0)

## 2014-05-09 ENCOUNTER — Ambulatory Visit (INDEPENDENT_AMBULATORY_CARE_PROVIDER_SITE_OTHER): Payer: Medicare Other | Admitting: Pharmacist

## 2014-05-09 DIAGNOSIS — I2699 Other pulmonary embolism without acute cor pulmonale: Secondary | ICD-10-CM | POA: Diagnosis not present

## 2014-05-09 DIAGNOSIS — I82403 Acute embolism and thrombosis of unspecified deep veins of lower extremity, bilateral: Secondary | ICD-10-CM

## 2014-05-09 LAB — POCT INR: INR: 3

## 2014-05-09 NOTE — Patient Instructions (Addendum)
Anticoagulation Dose Instructions as of 05/09/2014      Dorene Grebe Tue Wed Thu Fri Sat   New Dose 7.5 mg 7.5 mg 7.5 mg 7.5 mg 7.5 mg 10 mg 7.5 mg    Description        Continue warfarin to 2.5 mg - take 4 tablets on fridays and 3 tablets daily = 7.5mg  all other days.  [place] notified.      INR was 3.0 today

## 2014-05-22 ENCOUNTER — Other Ambulatory Visit: Payer: Self-pay | Admitting: Family Medicine

## 2014-05-22 MED ORDER — MECLIZINE HCL 32 MG PO TABS: 32.0000 mg | ORAL_TABLET | Freq: Three times a day (TID) | ORAL | Status: DC | PRN

## 2014-05-22 NOTE — Telephone Encounter (Signed)
Please call and check with patient or son, I assume this is meclizine. It is okay to refill this and take it for a week.

## 2014-06-03 ENCOUNTER — Encounter: Payer: Self-pay | Admitting: Pharmacist

## 2014-06-03 ENCOUNTER — Ambulatory Visit (INDEPENDENT_AMBULATORY_CARE_PROVIDER_SITE_OTHER): Payer: Medicare Other | Admitting: Pharmacist

## 2014-06-03 DIAGNOSIS — I2699 Other pulmonary embolism without acute cor pulmonale: Secondary | ICD-10-CM | POA: Diagnosis not present

## 2014-06-03 DIAGNOSIS — Z0000 Encounter for general adult medical examination without abnormal findings: Secondary | ICD-10-CM | POA: Diagnosis not present

## 2014-06-03 DIAGNOSIS — I82403 Acute embolism and thrombosis of unspecified deep veins of lower extremity, bilateral: Secondary | ICD-10-CM | POA: Diagnosis not present

## 2014-06-03 LAB — POCT INR: INR: 4.4

## 2014-06-03 NOTE — Progress Notes (Signed)
Subjective:   Scott [name] is a 81 y.o. male who presents for an Initial Medicare Annual Wellness Visit and to recheck INR  Current Medications (verified) Outpatient Encounter Prescriptions as of 06/03/2014  Medication Sig  . donepezil (ARICEPT) 10 MG tablet TAKE ONE TABLET AT BEDTIME  . fluticasone (FLONASE) 50 MCG/ACT nasal spray Place 2 sprays into both nostrils daily.  . furosemide (LASIX) 40 MG tablet 40 MG ON MON, WED, AND FRI'S; THEN 20 MG ALL OTHER DAYS  . gabapentin (NEURONTIN) 100 MG capsule Take 2 capsules (200 mg total) by mouth 2 (two) times daily.  . isosorbide mononitrate (IMDUR) 30 MG 24 hr tablet Take 0.5 tablets (15 mg total) by mouth daily.  Marland Kitchen losartan (COZAAR) 25 MG tablet Take 1 tablet (25 mg total) by mouth daily.  . meclizine (ANTIVERT) 32 MG tablet Take 1 tablet (32 mg total) by mouth 3 (three) times daily as needed.  . megestrol (MEGACE) 20 MG tablet TAKE 1 TABLET ONCE A DAY  . metoprolol succinate (TOPROL-XL) 50 MG 24 hr tablet TAKE (1) TABLET TWICE A DAY.  . nitroGLYCERIN (NITROSTAT) 0.4 MG SL tablet Place 1 tablet (0.4 mg total) under the tongue every 5 (five) minutes as needed for chest pain (up to 3 doses). (Patient not taking: Reported on 03/26/2014)  . potassium chloride SA (K-DUR,KLOR-CON) 20 MEQ tablet TAKE (2) TABLETS DAILY  . simvastatin (ZOCOR) 10 MG tablet Take 10 mg by mouth every morning.  Marland Kitchen spironolactone (ALDACTONE) 25 MG tablet Take 25 mg by mouth every morning.  . tamsulosin (FLOMAX) 0.4 MG CAPS capsule TAKE (1) CAPSULE DAILY  . ULORIC 40 MG tablet TAKE 1 TABLET DAILY  . warfarin (COUMADIN) 2.5 MG tablet TAKE UP TO 3 TABLETS DAILY AS DIRECTED BY CPP    Allergies (verified) Lipitor   History: Past Medical History  Diagnosis Date  . HTN (hypertension)   . Hyperlipidemia   . PVCs (premature ventricular contractions)   . Glaucoma   . PVD (peripheral vascular disease)     Right ABI .75, Left .78 (2006)  . Popliteal artery aneurysm,  bilateral DECUMENTED CHRONIC PARTIAL OCCLUSION--  PT DENIES CLAUDICATION OR ANY OTHER SYMPTOMS  . Frequency of urination   . Nocturia   . Bladder cancer 03/15/2012  . Chronic systolic CHF (congestive heart failure)     a. NICM - patent cors 06/2012, EF 40% at that time. b. 03/2013 eval: EF 20-25%.  . DVT, bilateral lower limbs 03/2013    a. Bilat DVT dx 03/2013.  . Pulmonary embolism     a. By CT angio 04/2013.  Marland Kitchen Elevated troponin     a. Adm 12/30-04/2013 - not felt to represent ACS; ?due to PE. b. Patent cors 06/2012.  Marland Kitchen NSVT (nonsustained ventricular tachycardia)     a. NSVT 06/2012; NSVT also seen during 03/2013 adm. b. Med rx. Not candidate for ICD given adv age.  Marland Kitchen ETOH abuse   . Syncope     a. Felt to be postural syncope related to diuretics 06/2012.  Marland Kitchen Heart murmur   . DVT (deep venous thrombosis)     "I think in both legs"  . Pneumonia     "couple times"  . DDD (degenerative disc disease)   . Arthritis     "all over"  . Chronic back pain   . Depression    Past Surgical History  Procedure Laterality Date  . Transthoracic echocardiogram  09-22-2010    MODERATE LVH/ EF 55%/ GRADE  I DIASTOLIC DYSFUNCTION/ AORTIC SCLEROSIS WITHOUT STENOSIS/  RV  SYSTOLIC  MILDLY REDUCED FUNCTION  . Cardiovascular stress test  10-15-2010    LOW RISK NUCLEAR STUDY/ NO EVIDENCE OF ISCHEMIA/ NORMAL EF  . Shoulder surgery  1970's    "don't remember which side or what kind of OR"  . Transurethral resection of bladder tumor  10/07/2011    Procedure: TRANSURETHRAL RESECTION OF BLADDER TUMOR (TURBT);  Surgeon: Malka So, MD;  [facility];  Service: Urology;  Laterality: N/A;  . Cystoscopy  10/07/2011    Procedure: CYSTOSCOPY;  Surgeon: Malka So, MD;  Location: [facility];  Service: Urology;  Laterality: N/A;  . Cystoscopy with biopsy  03/14/2012    Procedure: CYSTOSCOPY WITH BIOPSY;  Surgeon: Malka So, MD;  Location: [facility];  Service: Urology;  Laterality:  N/A;  WITH FULGURATION  . Transurethral resection of prostate  03/14/2012    Procedure: TRANSURETHRAL RESECTION OF THE PROSTATE WITH GYRUS INSTRUMENTS;  Surgeon: Malka So, MD;  Location: [facility];  Service: Urology;  Laterality: N/A;  . Tonsillectomy    . Cataract extraction w/ intraocular lens  implant, bilateral Bilateral   . Cardiac catheterization  05-11-2004  DR [place] PLAQUE/ NORMAL LVF/ EF 55%/  NON-OBSTRUCTIVE LAD 25%  . Cardiac catheterization  06/2012  . Eye surgery     Family History  Problem Relation Age of Onset  . Hypertension Mother   . Arthritis Mother   . Lung cancer Sister   . Deep vein thrombosis Father   . Early death Brother    Social History   Occupational History  . Retired    Social History Main Topics  . Smoking status: Former Smoker -- 1.00 packs/day for 20 years    Types: Cigarettes    Start date: 04/12/1942    Quit date: 04/13/1975  . Smokeless tobacco: Never Used  . Alcohol Use: No     Comment: 10/09/2013 "quit drinking in early June 2015"  . Drug Use: No  . Sexual Activity: No    Do you feel safe at home?  Yes  Current exercise habits:  goes to physical therapy 2 to 3 days per week  Current Dietary habits:  Breakfast - 2 eggs, 2 bacon and toast or cereal with coffee;  Lunch - usually skips;  Supper - meat with 1-2 vegetables and starch;  Snack - no  Drinks - water; no sodas   Objective:    Today's Vitals   06/03/14 1203  BP: 122/68  Pulse: 64  Height: 5' 4.5" (1.638 m)  Weight: 162 lb (73.483 kg)  PainSc: 4   PainLoc: Back     Activities of Daily Living In your present state of health, do you have any difficulty performing the following activities: 06/03/2014 10/09/2013  Is the patient deaf or have difficulty hearing? N N  Hearing [employer] Y  Difficulty concentrating or making decisions Y Y  Walking or climbing stairs? N N  Doing errands, shopping? N Y   Are there smokers in your home (other than  you)? No  Hearing Difficulties: No Do you often ask people to speak up or repeat themselves? No Do you experience ringing or noises in your ears? No Do you have difficulty understanding soft or whispered voices? No    Cardiac risk factors: advanced age (older than 72 for men, 40 for women), dyslipidemia, hypertension and male gender.  Depression  Screen PHQ 2/9 Scores 06/03/2014 03/26/2014 12/27/2013 07/25/2013  PHQ - 2 Score 1 0 0 0    Fall Risk Fall Risk  06/03/2014 03/26/2014 12/27/2013 07/25/2013 07/20/2013  Falls in the past year? No No Yes Yes Yes  Number falls in past yr: - - 2 or more 2 or more 2 or more  Injury with Fall? - - No - -  Risk Factor Category  - - High Fall Risk High Fall Risk High Fall Risk  Risk for fall due to : - - - History of fall(s);Impaired mobility;Impaired vision Impaired balance/gait    Cognitive Function: MMSE - Mini Mental State Exam 06/03/2014  Orientation to time 5  Orientation to Place 4  Registration 3  Attention/ Calculation 4  Recall 2  Language- name 2 objects 2  Language- repeat 1  Language- follow 3 step command 3  Language- read & follow direction 1  Write a sentence 1  Copy design 0  Total score 26    Immunizations and Health Maintenance Immunization History  Administered Date(s) Administered  . Influenza,inj,Quad PF,36+ Mos 02/12/2013, 01/31/2014  . Influenza-Unspecified 01/25/2012  . Pneumococcal Conjugate-13 05/17/2013   Health Maintenance Due  Topic Date Due  . TETANUS/TDAP  04/12/2014    INR was 4.4 today Patient Care Team: Chipper Herb, MD as PCP - General (Family Medicine) Thompson Grayer, MD as Consulting Physician (Cardiology) Rosetta Posner, MD as Consulting Physician (Vascular Surgery) Minus Breeding, MD as Consulting Physician (Cardiology)  Indicate any recent Medical Services you may have received from other than [facility] providers in the past year (date may be approximate).    Assessment:    Annual Wellness Visit   Supratherapeutic anticoagulation   Screening Tests Health Maintenance  Topic Date Due  . TETANUS/TDAP  04/12/2014  . ZOSTAVAX  02/13/2015 (Originally 09/12/1983)  . INFLUENZA VACCINE  11/11/2014  . COLONOSCOPY  09/11/2015  . PNEUMOCOCCAL POLYSACCHARIDE VACCINE AGE 15 AND OVER  Completed        Plan:   During the course of the visit [place] was educated and counseled about the following appropriate screening and preventive services:   Vaccines to include Pneumoccal, Influenza, Hepatitis B, Td, Zostavax - checked cost for Tdap and Zostavax - due to cost patient declined  Colorectal cancer screening - UTD  Cardiovascular disease screening - UTD  Diabetes screening - UTD  Bone Denisty / Osteoporosis Screening - since patient has lost 2" of height recommend DEXA  Glaucoma screening - needed but could not make appt as patient could not remember name of OD or opthmalolgist.  Patient to check records at home and make appt  Anticoagulation Dose Instructions as of 06/03/2014      Dorene Grebe Tue Wed Thu Fri Sat   New Dose 7.5 mg 7.5 mg 7.5 mg 7.5 mg 7.5 mg 7.5 mg 7.5 mg    Description        No warfarin for 2 days then decrease dose to Continue warfarin to 2.5 mg -  3 tablets daily = 7.5mg  all other days.  [place] notified.      RTC in 1 week   Patient Instructions (the written plan) were given to the patient.   Cherre Robins, [facility]   06/03/2014

## 2014-06-03 NOTE — Patient Instructions (Addendum)
Health Maintenance Summary     TETANUS/TDAP Overdue 04/12/2014 Cost is $40    ZOSTAVAX Postponed 02/13/2015 Cost is $40   Pneumonia Vaccine Completed 05/17/2013    Eye Exam Over due      INFLUENZA VACCINE Next Due 11/11/2014 Last Fall 2015    COLONOSCOPY Next Due 09/11/2015 Last 09/10/2005      **Recommend to make appointment to have eyes examined  Anticoagulation Dose Instructions as of 06/03/2014      Dorene Grebe Tue Wed Thu Fri Sat   New Dose 7.5 mg 7.5 mg 7.5 mg 7.5 mg 7.5 mg 7.5 mg 7.5 mg    Description        No warfarin for 2 days then decrease dose to Continue warfarin to 2.5 mg -  3 tablets daily = 7.5mg  all other days.  [place] notified.

## 2014-06-11 ENCOUNTER — Ambulatory Visit (INDEPENDENT_AMBULATORY_CARE_PROVIDER_SITE_OTHER): Payer: Medicare Other | Admitting: Pharmacist Clinician (PhC)/ Clinical Pharmacy Specialist

## 2014-06-11 DIAGNOSIS — I82403 Acute embolism and thrombosis of unspecified deep veins of lower extremity, bilateral: Secondary | ICD-10-CM | POA: Diagnosis not present

## 2014-06-11 DIAGNOSIS — M1000 Idiopathic gout, unspecified site: Secondary | ICD-10-CM | POA: Diagnosis not present

## 2014-06-11 DIAGNOSIS — G609 Hereditary and idiopathic neuropathy, unspecified: Secondary | ICD-10-CM | POA: Diagnosis not present

## 2014-06-11 DIAGNOSIS — M10071 Idiopathic gout, right ankle and foot: Secondary | ICD-10-CM

## 2014-06-11 DIAGNOSIS — I2699 Other pulmonary embolism without acute cor pulmonale: Secondary | ICD-10-CM | POA: Diagnosis not present

## 2014-06-11 LAB — POCT INR: INR: 2.1

## 2014-06-11 NOTE — Addendum Note (Signed)
Addended by: Memory Argue on: 06/11/2014 12:09 PM   Modules accepted: Orders

## 2014-06-12 LAB — URIC ACID: Uric Acid: 5.5 mg/dL (ref 3.7–8.6)

## 2014-06-18 DIAGNOSIS — M10072 Idiopathic gout, left ankle and foot: Secondary | ICD-10-CM | POA: Diagnosis not present

## 2014-06-18 DIAGNOSIS — M79672 Pain in left foot: Secondary | ICD-10-CM | POA: Diagnosis not present

## 2014-06-19 ENCOUNTER — Other Ambulatory Visit: Payer: Self-pay | Admitting: Family Medicine

## 2014-06-25 ENCOUNTER — Telehealth: Payer: Self-pay | Admitting: Pharmacist Clinician (PhC)/ Clinical Pharmacy Specialist

## 2014-06-25 ENCOUNTER — Other Ambulatory Visit: Payer: Self-pay | Admitting: Family Medicine

## 2014-06-25 NOTE — Telephone Encounter (Signed)
Called patient to let him know that his uric acid level was normal

## 2014-06-28 ENCOUNTER — Other Ambulatory Visit: Payer: Self-pay | Admitting: *Deleted

## 2014-06-28 MED ORDER — COLCHICINE 0.6 MG PO TABS: 0.6000 mg | ORAL_TABLET | Freq: Two times a day (BID) | ORAL | Status: DC

## 2014-06-28 NOTE — Telephone Encounter (Addendum)
Requesting refill on Colcrys that was given by Dr Irving Shows for gout flare up. Discussed with Dr Sabra Heck and sent in to pharmacy. Son aware.

## 2014-07-02 ENCOUNTER — Ambulatory Visit (INDEPENDENT_AMBULATORY_CARE_PROVIDER_SITE_OTHER): Payer: Medicare Other | Admitting: Family Medicine

## 2014-07-02 ENCOUNTER — Encounter: Payer: Self-pay | Admitting: Family Medicine

## 2014-07-02 VITALS — BP 122/58 | HR 60 | Temp 97.2°F | Ht 64.5 in | Wt 167.0 lb

## 2014-07-02 DIAGNOSIS — E785 Hyperlipidemia, unspecified: Secondary | ICD-10-CM | POA: Diagnosis not present

## 2014-07-02 DIAGNOSIS — L03032 Cellulitis of left toe: Secondary | ICD-10-CM | POA: Diagnosis not present

## 2014-07-02 DIAGNOSIS — I10 Essential (primary) hypertension: Secondary | ICD-10-CM | POA: Diagnosis not present

## 2014-07-02 DIAGNOSIS — I4891 Unspecified atrial fibrillation: Secondary | ICD-10-CM | POA: Diagnosis not present

## 2014-07-02 DIAGNOSIS — N400 Enlarged prostate without lower urinary tract symptoms: Secondary | ICD-10-CM

## 2014-07-02 DIAGNOSIS — H6123 Impacted cerumen, bilateral: Secondary | ICD-10-CM | POA: Diagnosis not present

## 2014-07-02 DIAGNOSIS — G629 Polyneuropathy, unspecified: Secondary | ICD-10-CM

## 2014-07-02 DIAGNOSIS — E559 Vitamin D deficiency, unspecified: Secondary | ICD-10-CM | POA: Diagnosis not present

## 2014-07-02 DIAGNOSIS — M109 Gout, unspecified: Secondary | ICD-10-CM | POA: Diagnosis not present

## 2014-07-02 LAB — POCT CBC
Granulocyte percent: 54.8 %G (ref 37–80)
HCT, POC: 39.9 % — AB (ref 43.5–53.7)
Hemoglobin: 12.4 g/dL — AB (ref 14.1–18.1)
Lymph, poc: 1.7 (ref 0.6–3.4)
MCH, POC: 26.6 pg — AB (ref 27–31.2)
MCHC: 31.1 g/dL — AB (ref 31.8–35.4)
MCV: 85.5 fL (ref 80–97)
MPV: 7.2 fL (ref 0–99.8)
POC Granulocyte: 2.6 (ref 2–6.9)
POC LYMPH PERCENT: 35.4 %L (ref 10–50)
Platelet Count, POC: 225 10*3/uL (ref 142–424)
RBC: 4.66 M/uL — AB (ref 4.69–6.13)
RDW, POC: 16.2 %
WBC: 4.7 10*3/uL (ref 4.6–10.2)

## 2014-07-02 MED ORDER — CEPHALEXIN 500 MG PO CAPS: 500.0000 mg | ORAL_CAPSULE | Freq: Three times a day (TID) | ORAL | Status: DC

## 2014-07-02 NOTE — Progress Notes (Signed)
Subjective:    Patient ID: Scott [name], male    DOB: 03-15-1922, 81 y.o.   MRN: [id number]  HPI Pt here for follow up and management of chronic medical problems which includes hypertension, hyperlipidemia, and atrial fibrillation. He states he is taking medications regularly. Patient has no specific complaints other than some pain in his left foot. The patient is alert and responds appropriately to questions asked of him. He is elderly and kyphotic. He denies chest pain. He says he's had some shortness of breath but no more than usual with activity around the house. His bowels are moving okay with the current medicine he is taking because he is having loose bowel movements from the medicine for gout. He is having no problems with passing his water.          Patient Active Problem List   Diagnosis Date Noted  . NSTEMI (non-ST elevated myocardial infarction) 10/09/2013  . High risk medication use 05/17/2013  . BPH (benign prostatic hyperplasia) 05/17/2013  . Vitamin D deficiency 05/17/2013  . Chronic anticoagulation- subtheraputic INR on admission 04/19/2013  . Acute pulmonary embolism 04/17/2013  . NSVT (nonsustained ventricular tachycardia)- not an ICD candidate 04/17/2013  . NICM (nonischemic cardiomyopathy)- EF 20-25% by Echo 04/12/13 04/17/2013  . Elevated troponin 04/10/2013  . Chronic systolic heart failure 60/73/7106  . DVT of lower extremity, bilateral 03/15/2013  . Fatigue 11/02/2012  . Constipation 11/02/2012  . Alcohol abuse 08/27/2012  . Syncope 06/21/2012  . Bladder cancer 03/15/2012  . Benign localized hyperplasia of prostate with urinary obstruction 03/15/2012  . HTN (hypertension)   . Hyperlipidemia   . PVCs (premature ventricular contractions)   . CAD- non obstructive disease by cath 3/14    Outpatient Encounter Prescriptions as of 07/02/2014  Medication Sig  . colchicine 0.6 MG tablet Take 1 tablet (0.6 mg total) by mouth 2 (two) times daily.  Marland Kitchen donepezil  (ARICEPT) 10 MG tablet TAKE ONE TABLET AT BEDTIME  . fluticasone (FLONASE) 50 MCG/ACT nasal spray Place 2 sprays into both nostrils daily.  . furosemide (LASIX) 40 MG tablet 40 MG ON MON, WED, AND FRI'S; THEN 20 MG ALL OTHER DAYS  . gabapentin (NEURONTIN) 100 MG capsule Take 2 capsules (200 mg total) by mouth 2 (two) times daily.  . isosorbide mononitrate (IMDUR) 30 MG 24 hr tablet Take 0.5 tablets (15 mg total) by mouth daily.  Marland Kitchen losartan (COZAAR) 25 MG tablet Take 1 tablet (25 mg total) by mouth daily.  . meclizine (ANTIVERT) 32 MG tablet Take 1 tablet (32 mg total) by mouth 3 (three) times daily as needed.  . megestrol (MEGACE) 20 MG tablet TAKE 1 TABLET ONCE A DAY  . metoprolol succinate (TOPROL-XL) 50 MG 24 hr tablet TAKE (1) TABLET TWICE A DAY.  Marland Kitchen potassium chloride SA (K-DUR,KLOR-CON) 20 MEQ tablet TAKE (2) TABLETS DAILY  . simvastatin (ZOCOR) 10 MG tablet Take 10 mg by mouth every morning.  Marland Kitchen spironolactone (ALDACTONE) 25 MG tablet Take 25 mg by mouth every morning.  Marland Kitchen spironolactone (ALDACTONE) 25 MG tablet TAKE 1 TABLET DAILY  . tamsulosin (FLOMAX) 0.4 MG CAPS capsule TAKE (1) CAPSULE DAILY  . ULORIC 40 MG tablet TAKE 1 TABLET DAILY  . warfarin (COUMADIN) 2.5 MG tablet TAKE UP TO 3 TABLETS DAILY AS DIRECTED BY CPP  . nitroGLYCERIN (NITROSTAT) 0.4 MG SL tablet Place 1 tablet (0.4 mg total) under the tongue every 5 (five) minutes as needed for chest pain (up to 3 doses). (Patient not  taking: Reported on 03/26/2014)    Review of Systems  Constitutional: Negative.   HENT: Negative.   Eyes: Negative.   Respiratory: Negative.   Cardiovascular: Negative.   Gastrointestinal: Negative.   Endocrine: Negative.   Genitourinary: Negative.   Musculoskeletal: Positive for arthralgias (left foot pain).  Skin: Negative.   Allergic/Immunologic: Negative.   Neurological: Negative.   Hematological: Negative.   Psychiatric/Behavioral: Negative.        Objective:   Physical Exam    Constitutional: He is oriented to person, place, and time. No distress.  HENT:  Head: Normocephalic and atraumatic.  Nose: Nose normal.  Mouth/Throat: Oropharynx is clear and moist. No oropharyngeal exudate.  Both ear canals are impacted with cerumen and these were irrigated successfully before the patient left the office today.  Eyes: Conjunctivae and EOM are normal. Pupils are equal, round, and reactive to light. Right eye exhibits no discharge. Left eye exhibits no discharge. No scleral icterus.  Neck: Normal range of motion. Neck supple. No thyromegaly present.  No anterior cervical adenopathy or carotid bruits  Cardiovascular: Normal rate, normal heart sounds and intact distal pulses.   No murmur heard. The rhythm is irregular irregular at 60/m  Pulmonary/Chest: Effort normal and breath sounds normal. No respiratory distress. He has no wheezes. He has no rales. He exhibits no tenderness.  Abdominal: Soft. Bowel sounds are normal. He exhibits no mass. There is no tenderness. There is no rebound and no guarding.  The abdomen was soft without masses or bruits  Genitourinary: Rectum normal and penis normal.  There was minimal enlargement if any to the prostate today. There were no inguinal hernias palpable. External genitalia appeared normal. There were no rectal masses.  Musculoskeletal: Normal range of motion. He exhibits no edema.  There was no rubor or erythema to the lower extremities. The joints were not tender to palpation.  Lymphadenopathy:    He has no cervical adenopathy.  Neurological: He is alert and oriented to person, place, and time. He has normal reflexes. No cranial nerve deficit.  Skin: Skin is warm and dry. No rash noted. No erythema. No pallor.  There was some slight swelling between the fourth and fifth toes of the left foot with tenderness to palpation. There was some bloody drainage between the 2 toes. There was some slight edema between this the toes and proximal to  the toes.  Psychiatric: He has a normal mood and affect. His behavior is normal. Judgment and thought content normal.  Nursing note and vitals reviewed.  BP 122/58 mmHg  Pulse 60  Temp(Src) 97.2 F (36.2 C) (Oral)  Ht 5' 4.5" (1.638 m)  Wt 167 lb (75.751 kg)  BMI 28.23 kg/m2        Assessment & Plan:  1. Essential hypertension -The patient's blood pressure was good today he should continue with his current treatment. - POCT CBC - BMP8+EGFR - Hepatic function panel  2. Hyperlipidemia -Cholesterol numbers will be checked today and for the time being he should continue with his current treatment - POCT CBC - Lipid panel  3. Peripheral neuropathy -This is been an ongoing problem for this patient and it appears to be no worse today than usual. - POCT CBC  4. Vitamin D deficiency -Vitamin D dosage is dependent upon lab work that we do today. - POCT CBC - Vit D  25 hydroxy (rtn osteoporosis monitoring)  5. BPH (benign prostatic hyperplasia) -There were no rectal masses on the exam today. - POCT CBC  6. Atrial fibrillation, unspecified -The patient continues to have atrial fibrillation at a rate of about 60/m and he seems to be tolerating this well. - POCT CBC  7. Gout of left foot, unspecified cause, unspecified chronicity -The most recent uric acid was well within normal limits. The patient has been taking:colcrys and is having a lot of loose stools and we will ask him to hold this for the time being. - Uric acid  8. Cellulitis of toe of left foot -Keflex 500 twice daily and recheck foot in 10-14 days  9. Impacted cerumen of both ears -Both ear canals were successfully irrigated to remove the cerumen  Meds ordered this encounter  Medications  . cephALEXin (KEFLEX) 500 MG capsule    Sig: Take 1 capsule (500 mg total) by mouth 3 (three) times daily.    Dispense:  30 capsule    Refill:  0   Patient Instructions                       Medicare Annual Wellness  Visit  [facility] [facility] and the medical providers at [facility] strive to bring you the best medical care.  In doing so we not only want to address your current medical conditions and concerns but also to detect new conditions early and prevent illness, disease and health-related problems.    Medicare offers a yearly Wellness Visit which allows our clinical staff to assess your need for preventative services including immunizations, lifestyle education, counseling to decrease risk of preventable diseases and screening for fall risk and other medical concerns.    This visit is provided free of charge (no copay) for all Medicare recipients. The clinical pharmacists at [facility] have begun to conduct these Wellness Visits which will also include a thorough review of all your medications.    As you primary medical provider recommend that you make an appointment for your Annual Wellness Visit if you have not done so already this year.  You may set up this appointment before you leave today or you may call back [phone]) and schedule an appointment.  Please make sure when you call that you mention that you are scheduling your Annual Wellness Visit with the clinical pharmacist so that the appointment may be made for the proper length of time.     Continue current medications. Continue good therapeutic lifestyle changes which include good diet and exercise. Fall precautions discussed with patient. If an FOBT was given today- please return it to our front desk. If you are over 31 years old - you may need Prevnar 50 or the adult Pneumonia vaccine.  Flu Shots are still available at our office. If you still haven't had one please call to set up a nurse visit to get one.   After your visit with [place] today you will receive a survey in the mail or online from [employer] regarding your care with [place]. Please take a moment to fill this out. Your feedback is very important  to [place] as you can help [place] better understand your patient needs as well as improve your experience and satisfaction. WE CARE ABOUT YOU!!!    STOP THE COLCHICINE. WE WILL START ANTIBIOTIC FOR THE FOOT. Take the antibiotic as directed Clean the area between the toes twice daily with Betadine Make sure that you return to the clinic in a couple weeks for [place] to look at this area again. We will call you with the  lab results once those results become available    Arrie Senate MD

## 2014-07-02 NOTE — Patient Instructions (Addendum)
Medicare Annual Wellness Visit  [facility] and the medical providers at [facility] strive to bring you the best medical care.  In doing so we not only want to address your current medical conditions and concerns but also to detect new conditions early and prevent illness, disease and health-related problems.    Medicare offers a yearly Wellness Visit which allows our clinical staff to assess your need for preventative services including immunizations, lifestyle education, counseling to decrease risk of preventable diseases and screening for fall risk and other medical concerns.    This visit is provided free of charge (no copay) for all Medicare recipients. The clinical pharmacists at [facility] have begun to conduct these Wellness Visits which will also include a thorough review of all your medications.    As you primary medical provider recommend that you make an appointment for your Annual Wellness Visit if you have not done so already this year.  You may set up this appointment before you leave today or you may call back [phone]) and schedule an appointment.  Please make sure when you call that you mention that you are scheduling your Annual Wellness Visit with the clinical pharmacist so that the appointment may be made for the proper length of time.     Continue current medications. Continue good therapeutic lifestyle changes which include good diet and exercise. Fall precautions discussed with patient. If an FOBT was given today- please return it to our front desk. If you are over 38 years old - you may need Prevnar 46 or the adult Pneumonia vaccine.  Flu Shots are still available at our office. If you still haven't had one please call to set up a nurse visit to get one.   After your visit with [place] today you will receive a survey in the mail or online from [employer] regarding your care with [place]. Please take a moment to  fill this out. Your feedback is very important to [place] as you can help [place] better understand your patient needs as well as improve your experience and satisfaction. WE CARE ABOUT YOU!!!    STOP THE COLCHICINE. WE WILL START ANTIBIOTIC FOR THE FOOT. Take the antibiotic as directed Clean the area between the toes twice daily with Betadine Make sure that you return to the clinic in a couple weeks for [place] to look at this area again. We will call you with the lab results once those results become available

## 2014-07-03 LAB — VITAMIN D 25 HYDROXY (VIT D DEFICIENCY, FRACTURES): Vit D, 25-Hydroxy: 30.1 ng/mL (ref 30.0–100.0)

## 2014-07-03 LAB — BMP8+EGFR
BUN/Creatinine Ratio: 25 — ABNORMAL HIGH (ref 10–22)
BUN: 44 mg/dL — ABNORMAL HIGH (ref 10–36)
CO2: 19 mmol/L (ref 18–29)
Calcium: 10.1 mg/dL (ref 8.6–10.2)
Chloride: 104 mmol/L (ref 97–108)
Creatinine, Ser: 1.76 mg/dL — ABNORMAL HIGH (ref 0.76–1.27)
GFR calc Af Amer: 39 mL/min/{1.73_m2} — ABNORMAL LOW (ref >59)
GFR calc non Af Amer: 33 mL/min/{1.73_m2} — ABNORMAL LOW (ref >59)
Glucose: 82 mg/dL (ref 65–99)
Potassium: 5.4 mmol/L — ABNORMAL HIGH (ref 3.5–5.2)
Sodium: 138 mmol/L (ref 134–144)

## 2014-07-03 LAB — LIPID PANEL
Chol/HDL Ratio: 3.7 ratio units (ref 0.0–5.0)
Cholesterol, Total: 146 mg/dL (ref 100–199)
HDL: 39 mg/dL — ABNORMAL LOW (ref >39)
LDL Calculated: 70 mg/dL (ref 0–99)
Triglycerides: 183 mg/dL — ABNORMAL HIGH (ref 0–149)
VLDL Cholesterol Cal: 37 mg/dL (ref 5–40)

## 2014-07-03 LAB — HEPATIC FUNCTION PANEL
ALT: 11 IU/L (ref 0–44)
AST: 16 IU/L (ref 0–40)
Albumin: 4.2 g/dL (ref 3.2–4.6)
Alkaline Phosphatase: 174 IU/L — ABNORMAL HIGH (ref 39–117)
Bilirubin Total: 0.3 mg/dL (ref 0.0–1.2)
Bilirubin, Direct: 0.08 mg/dL (ref 0.00–0.40)
Total Protein: 6.8 g/dL (ref 6.0–8.5)

## 2014-07-03 LAB — URIC ACID: Uric Acid: 5.2 mg/dL (ref 3.7–8.6)

## 2014-07-09 ENCOUNTER — Other Ambulatory Visit: Payer: Self-pay | Admitting: Family Medicine

## 2014-07-16 ENCOUNTER — Encounter: Payer: Self-pay | Admitting: Pharmacist Clinician (PhC)/ Clinical Pharmacy Specialist

## 2014-07-22 ENCOUNTER — Ambulatory Visit (INDEPENDENT_AMBULATORY_CARE_PROVIDER_SITE_OTHER): Payer: Medicare Other | Admitting: Pharmacist

## 2014-07-22 DIAGNOSIS — I2699 Other pulmonary embolism without acute cor pulmonale: Secondary | ICD-10-CM | POA: Diagnosis not present

## 2014-07-22 DIAGNOSIS — R791 Abnormal coagulation profile: Secondary | ICD-10-CM

## 2014-07-22 DIAGNOSIS — I82403 Acute embolism and thrombosis of unspecified deep veins of lower extremity, bilateral: Secondary | ICD-10-CM | POA: Diagnosis not present

## 2014-07-22 LAB — POCT HEMOGLOBIN: Hemoglobin: 10.1 g/dL — AB (ref 14.1–18.1)

## 2014-07-22 LAB — POCT INR: INR: 5.2

## 2014-07-22 MED ORDER — PHYTONADIONE 5 MG PO TABS
2.5000 mg | ORAL_TABLET | Freq: Once | ORAL | Status: AC
  Administered 2014-07-22: 2.5 mg via ORAL

## 2014-07-22 NOTE — Progress Notes (Signed)
Dr. Laurance Flatten looked at patient left foot.  Cellulitis improved.  No further ABX needed.  Patient given vitamin K 2.5mg  in office today.  Notified his pharmacy of change in warfarin dose.

## 2014-07-22 NOTE — Patient Instructions (Signed)
Anticoagulation Dose Instructions as of 07/22/2014      Scott [name] Tue Wed Thu Fri Sat   New Dose 7.5 mg 7.5 mg 7.5 mg 7.5 mg 7.5 mg 7.5 mg 7.5 mg    Description        You were given vitamin K 2.5mg  in office today. This should help decrease INR.  Also no warfarin today - Monday, April 11th or tomorrow - Tuesday April 12th.  Then decrease dose to 2 tablets on saturdays and tuesdays and 3 tablets all other days.

## 2014-07-25 ENCOUNTER — Ambulatory Visit (INDEPENDENT_AMBULATORY_CARE_PROVIDER_SITE_OTHER): Payer: Medicare Other | Admitting: Pharmacist

## 2014-07-25 ENCOUNTER — Other Ambulatory Visit: Payer: Self-pay | Admitting: Family Medicine

## 2014-07-25 DIAGNOSIS — I82403 Acute embolism and thrombosis of unspecified deep veins of lower extremity, bilateral: Secondary | ICD-10-CM

## 2014-07-25 DIAGNOSIS — I2699 Other pulmonary embolism without acute cor pulmonale: Secondary | ICD-10-CM | POA: Diagnosis not present

## 2014-07-25 LAB — POCT INR: INR: 2.1

## 2014-07-25 NOTE — Patient Instructions (Signed)
Anticoagulation Dose Instructions as of 07/25/2014      Scott [name] Tue Wed Thu Fri Sat   New Dose 7.5 mg 7.5 mg 5 mg 7.5 mg 7.5 mg 7.5 mg 5 mg    Description         Continue warfarin dose of 2 tablets on saturdays and tuesdays and 3 tablets all other days.

## 2014-07-30 ENCOUNTER — Ambulatory Visit (INDEPENDENT_AMBULATORY_CARE_PROVIDER_SITE_OTHER): Payer: Medicare Other | Admitting: Family Medicine

## 2014-07-30 ENCOUNTER — Telehealth: Payer: Self-pay | Admitting: Family Medicine

## 2014-07-30 ENCOUNTER — Encounter: Payer: Self-pay | Admitting: Family Medicine

## 2014-07-30 ENCOUNTER — Ambulatory Visit (INDEPENDENT_AMBULATORY_CARE_PROVIDER_SITE_OTHER): Payer: Medicare Other

## 2014-07-30 VITALS — BP 118/60 | HR 73 | Temp 98.7°F | Ht 64.5 in | Wt 168.0 lb

## 2014-07-30 DIAGNOSIS — M79672 Pain in left foot: Secondary | ICD-10-CM

## 2014-07-30 DIAGNOSIS — L03116 Cellulitis of left lower limb: Secondary | ICD-10-CM

## 2014-07-30 DIAGNOSIS — M79609 Pain in unspecified limb: Secondary | ICD-10-CM | POA: Diagnosis not present

## 2014-07-30 LAB — POCT CBC
Granulocyte percent: 61.1 %G (ref 37–80)
HCT, POC: 36 % — AB (ref 43.5–53.7)
Hemoglobin: 10.7 g/dL — AB (ref 14.1–18.1)
Lymph, poc: 1.3 (ref 0.6–3.4)
MCH, POC: 25.5 pg — AB (ref 27–31.2)
MCHC: 29.8 g/dL — AB (ref 31.8–35.4)
MCV: 85.7 fL (ref 80–97)
MPV: 6.6 fL (ref 0–99.8)
POC Granulocyte: 2.8 (ref 2–6.9)
POC LYMPH PERCENT: 28 %L (ref 10–50)
Platelet Count, POC: 226 10*3/uL (ref 142–424)
RBC: 4.2 M/uL — AB (ref 4.69–6.13)
RDW, POC: 15.7 %
WBC: 4.6 10*3/uL (ref 4.6–10.2)

## 2014-07-30 MED ORDER — CEPHALEXIN 500 MG PO CAPS: 500.0000 mg | ORAL_CAPSULE | Freq: Three times a day (TID) | ORAL | Status: DC

## 2014-07-30 NOTE — Telephone Encounter (Signed)
done

## 2014-07-30 NOTE — Progress Notes (Addendum)
Subjective:    Patient ID: Scott [name], male    DOB: [birth year], 81 y.o.   MRN: [id number]  HPI Patient here today for on-going left foot pain. He has already seen Dr Irving Shows for this as well and it is not getting better.      Patient Active Problem List   Diagnosis Date Noted  . NSTEMI (non-ST elevated myocardial infarction) 10/09/2013  . High risk medication use 05/17/2013  . BPH (benign prostatic hyperplasia) 05/17/2013  . Vitamin D deficiency 05/17/2013  . Chronic anticoagulation- subtheraputic INR on admission 04/19/2013  . Acute pulmonary embolism 04/17/2013  . NSVT (nonsustained ventricular tachycardia)- not an ICD candidate 04/17/2013  . NICM (nonischemic cardiomyopathy)- EF 20-25% by Echo 04/12/13 04/17/2013  . Elevated troponin 04/10/2013  . Chronic systolic heart failure 28/78/6767  . DVT of lower extremity, bilateral 03/15/2013  . Fatigue 11/02/2012  . Constipation 11/02/2012  . Alcohol abuse 08/27/2012  . Syncope 06/21/2012  . Bladder cancer 03/15/2012  . Benign localized hyperplasia of prostate with urinary obstruction 03/15/2012  . HTN (hypertension)   . Hyperlipidemia   . PVCs (premature ventricular contractions)   . CAD- non obstructive disease by cath 3/14    Outpatient Encounter Prescriptions as of 07/30/2014  Medication Sig  . donepezil (ARICEPT) 10 MG tablet TAKE ONE TABLET AT BEDTIME  . fluticasone (FLONASE) 50 MCG/ACT nasal spray Place 2 sprays into both nostrils daily.  . furosemide (LASIX) 40 MG tablet 40 MG ON MON, WED, AND FRI'S; THEN 20 MG ALL OTHER DAYS  . gabapentin (NEURONTIN) 100 MG capsule TAKE (2) CAPSULES TWICE DAILY.  . isosorbide mononitrate (IMDUR) 30 MG 24 hr tablet Take 0.5 tablets (15 mg total) by mouth daily.  Marland Kitchen losartan (COZAAR) 25 MG tablet Take 1 tablet (25 mg total) by mouth daily.  . meclizine (ANTIVERT) 32 MG tablet Take 1 tablet (32 mg total) by mouth 3 (three) times daily as needed.  . megestrol (MEGACE) 20 MG tablet TAKE 1  TABLET ONCE A DAY  . metoprolol succinate (TOPROL-XL) 50 MG 24 hr tablet TAKE (1) TABLET TWICE A DAY.  . nitroGLYCERIN (NITROSTAT) 0.4 MG SL tablet Place 1 tablet (0.4 mg total) under the tongue every 5 (five) minutes as needed for chest pain (up to 3 doses).  . potassium chloride SA (K-DUR,KLOR-CON) 20 MEQ tablet TAKE (2) TABLETS DAILY  . simvastatin (ZOCOR) 10 MG tablet Take 10 mg by mouth every morning.  . simvastatin (ZOCOR) 10 MG tablet TAKE 1 TABLET DAILY  . spironolactone (ALDACTONE) 25 MG tablet Take 25 mg by mouth every morning.  Marland Kitchen spironolactone (ALDACTONE) 25 MG tablet TAKE 1 TABLET DAILY  . tamsulosin (FLOMAX) 0.4 MG CAPS capsule TAKE (1) CAPSULE DAILY  . ULORIC 40 MG tablet TAKE 1 TABLET DAILY  . warfarin (COUMADIN) 2.5 MG tablet TAKE UP TO 3 TABLETS DAILY AS DIRECTED BY CPP  . [DISCONTINUED] cephALEXin (KEFLEX) 500 MG capsule Take 1 capsule (500 mg total) by mouth 3 (three) times daily.     Review of Systems  Constitutional: Negative.   HENT: Negative.   Eyes: Negative.   Respiratory: Negative.   Cardiovascular: Negative.   Gastrointestinal: Negative.   Endocrine: Negative.   Genitourinary: Negative.   Musculoskeletal: Positive for arthralgias (left foot pain and swelling).  Skin: Negative.   Allergic/Immunologic: Negative.   Neurological: Negative.   Hematological: Negative.   Psychiatric/Behavioral: Negative.        Objective:   Physical Exam  Constitutional: He is oriented to  person, place, and time. He appears well-developed and well-nourished. No distress.  HENT:  Head: Normocephalic.  Musculoskeletal: He exhibits tenderness.  Range of motion is limited due to pain and left foot and patient is using a cane.  Neurological: He is alert and oriented to person, place, and time.  Skin: Skin is warm. No rash noted. There is erythema. No pallor.  There is pain between the fourth and fifth toes and edema of the dorsal foot on the left foot. There is drainage from  between the fourth and fifth toes.  Psychiatric: He has a normal mood and affect. His behavior is normal. Judgment and thought content normal.  Nursing note and vitals reviewed.  BP 118/60 mmHg  Pulse 73  Temp(Src) 98.7 F (37.1 C) (Oral)  Ht 5' 4.5" (1.638 m)  Wt 168 lb (76.204 kg)  BMI 28.40 kg/m2  [facility] reading (PRIMARY) by  Dr. Louretta Parma foot--- no obvious osteomyelitis                                 Results for orders placed or performed in visit on 07/30/14  POCT CBC  Result Value Ref Range   WBC 4.6 4.6 - 10.2 K/uL   Lymph, poc 1.3 0.6 - 3.4   POC LYMPH PERCENT 28.0 10 - 50 %L   POC Granulocyte 2.8 2 - 6.9   Granulocyte percent 61.1 37 - 80 %G   RBC 4.20 (A) 4.69 - 6.13 M/uL   Hemoglobin 10.7 (A) 14.1 - 18.1 g/dL   HCT, POC 36.0 (A) 43.5 - 53.7 %   MCV 85.7 80 - 97 fL   MCH, POC 25.5 (A) 27 - 31.2 pg   MCHC 29.8 (A) 31.8 - 35.4 g/dL   RDW, POC 15.7 %   Platelet Count, POC 226 142 - 424 K/uL   MPV 6.6 0 - 99.8 fL   The patient was informed of the CBC result before he left the office.      Assessment & Plan:  1. Cellulitis of left foot -The patient will be asked to soak the foot in some warm saline for 20 minutes 3 or 4 times daily. After soaking he will apply Betadine solution and a piece of gauze between the fourth and fifth toes of the left foot -He will return to the clinic in 1 week for recheck - POCT CBC  2. Foot pain, left - Uric acid - DG Foot Complete Left; Future  Meds ordered this encounter  Medications  . cephALEXin (KEFLEX) 500 MG capsule    Sig: Take 1 capsule (500 mg total) by mouth 3 (three) times daily.    Dispense:  30 capsule    Refill:  1     Patient Instructions  Clean the area between the fourth and fifth toes 4 times daily with Betadine solution. This cleansing should be done after soaking in warm salt water. The antibiotic, Keflex and be taken 4 times daily We should recheck the foot in 1 week Elevate the foot when sitting We  will call with lab work once that becomes available and the result of the x-ray that we are doing today.   Arrie Senate MD

## 2014-07-30 NOTE — Patient Instructions (Signed)
Clean the area between the fourth and fifth toes 4 times daily with Betadine solution. This cleansing should be done after soaking in warm salt water. The antibiotic, Keflex and be taken 4 times daily We should recheck the foot in 1 week Elevate the foot when sitting We will call with lab work once that becomes available and the result of the x-ray that we are doing today.

## 2014-07-31 ENCOUNTER — Telehealth: Payer: Self-pay | Admitting: Family Medicine

## 2014-07-31 LAB — URIC ACID: Uric Acid: 5.3 mg/dL (ref 3.7–8.6)

## 2014-08-02 ENCOUNTER — Ambulatory Visit (INDEPENDENT_AMBULATORY_CARE_PROVIDER_SITE_OTHER): Payer: Medicare Other | Admitting: Physician Assistant

## 2014-08-02 ENCOUNTER — Other Ambulatory Visit: Payer: Self-pay | Admitting: Family Medicine

## 2014-08-02 ENCOUNTER — Encounter: Payer: Self-pay | Admitting: Physician Assistant

## 2014-08-02 VITALS — BP 127/65 | HR 66 | Temp 98.2°F | Ht 64.5 in | Wt 168.0 lb

## 2014-08-02 DIAGNOSIS — L97529 Non-pressure chronic ulcer of other part of left foot with unspecified severity: Secondary | ICD-10-CM | POA: Diagnosis not present

## 2014-08-02 MED ORDER — MUPIROCIN 2 % EX OINT: TOPICAL_OINTMENT | CUTANEOUS | Status: DC

## 2014-08-02 NOTE — Progress Notes (Signed)
   Subjective:    Patient ID: Scott [name], male    DOB: 1921/09/27, 81 y.o.   MRN: [id number]  HPI 81 y/o male presents for f/u of left foot pain associated with gout as well as a nonhealing ulcer between left great toe and 2nd toe. Bandage in place - clean, however moist. Patient has not been using epsom salt in his soak, however has been soaking in warm water TID. Has been taking Keflex.     Review of Systems  Skin: Positive for wound (between left great toe and 2nd digit).       Objective:   Physical Exam  Skin:  Ulcerative lesion between left great toe and 2nd toe. Purulent drainage on bandage. Bandage is damp.           Assessment & Plan:  1. Ulcer of left foot  - mupirocin ointment (BACTROBAN) 2 %; Apply small amt with qtip to wound after soaking TID  Dispense: 22 g; Refill: 0 - Aerobic culture to r/o antibiotic resistance.  - Advised patient to dry thoroughly prior to applying bandage.   - Has f/u with Dr. Laurance Flatten next week.     Ewa Hipp A. Benjamin Stain PA-C

## 2014-08-02 NOTE — Patient Instructions (Signed)
CONTINUE SOAKING IN EPSOM SALT WITH WARM WATER THREE TIMES DAILY FOR 20 MINUTES - 1/4 CUP SALT TO WARM WATER AFTER SOAK, RINSE WITH BETADINE SOLUTION - LET DRY THOROUGHLY (PAT WITH DRY GAUZE IF NEEDED FOR DRYING) - APPLY PEA SIZED AMOUNT OF MUPIROCIN OINTMENT TO ULCER ON TOE -GENTLY ROLL UP NONSTICK GAUZE AND PUT BETWEEN TOE

## 2014-08-04 LAB — AEROBIC CULTURE

## 2014-08-05 ENCOUNTER — Encounter: Payer: Self-pay | Admitting: Physician Assistant

## 2014-08-06 ENCOUNTER — Other Ambulatory Visit: Payer: Self-pay | Admitting: *Deleted

## 2014-08-06 ENCOUNTER — Encounter: Payer: Self-pay | Admitting: Family Medicine

## 2014-08-06 ENCOUNTER — Ambulatory Visit (INDEPENDENT_AMBULATORY_CARE_PROVIDER_SITE_OTHER): Payer: Medicare Other | Admitting: Family Medicine

## 2014-08-06 VITALS — BP 135/70 | HR 73 | Temp 99.1°F | Ht 64.5 in | Wt 169.0 lb

## 2014-08-06 DIAGNOSIS — L03116 Cellulitis of left lower limb: Secondary | ICD-10-CM

## 2014-08-06 DIAGNOSIS — S91302D Unspecified open wound, left foot, subsequent encounter: Secondary | ICD-10-CM | POA: Diagnosis not present

## 2014-08-06 MED ORDER — DOXYCYCLINE HYCLATE 100 MG PO TABS: 100.0000 mg | ORAL_TABLET | Freq: Two times a day (BID) | ORAL | Status: DC

## 2014-08-06 NOTE — Progress Notes (Signed)
Subjective:    Patient ID: Scott [name], male    DOB: 05-17-1921, 81 y.o.   MRN: [id number]  HPI Patient here today for follow up on left foot wound. We will also do a face to face visit for home health - wound. The patient continues to complain with problems with his foot. The culture and sensitivity that was done on Friday was returned and indicated that the cephalexin was not an appropriate antibiotic choice. This will be discussed with the patient today and a new antibiotic has already been called in of doxycycline 100 twice daily for 2 weeks. The patient's wife is currently receiving home health care and we will ask them because of his age to follow his foot also and hopefully we can make improvements with a new antibiotic and more extensive home health care to clear this wound up between his 2 toes. The patient is pleasant and alert and understands how to change the dressings on his toes.        Patient Active Problem List   Diagnosis Date Noted  . NSTEMI (non-ST elevated myocardial infarction) 10/09/2013  . High risk medication use 05/17/2013  . BPH (benign prostatic hyperplasia) 05/17/2013  . Vitamin D deficiency 05/17/2013  . Chronic anticoagulation- subtheraputic INR on admission 04/19/2013  . Acute pulmonary embolism 04/17/2013  . NSVT (nonsustained ventricular tachycardia)- not an ICD candidate 04/17/2013  . NICM (nonischemic cardiomyopathy)- EF 20-25% by Echo 04/12/13 04/17/2013  . Elevated troponin 04/10/2013  . Chronic systolic heart failure 40/34/7425  . DVT of lower extremity, bilateral 03/15/2013  . Fatigue 11/02/2012  . Constipation 11/02/2012  . Alcohol abuse 08/27/2012  . Syncope 06/21/2012  . Bladder cancer 03/15/2012  . Benign localized hyperplasia of prostate with urinary obstruction 03/15/2012  . HTN (hypertension)   . Hyperlipidemia   . PVCs (premature ventricular contractions)   . CAD- non obstructive disease by cath 3/14    Outpatient Encounter  Prescriptions as of 08/06/2014  Medication Sig  . donepezil (ARICEPT) 10 MG tablet TAKE ONE TABLET AT BEDTIME  . doxycycline (VIBRA-TABS) 100 MG tablet Take 1 tablet (100 mg total) by mouth 2 (two) times daily. With food  . fluticasone (FLONASE) 50 MCG/ACT nasal spray Place 2 sprays into both nostrils daily.  . furosemide (LASIX) 40 MG tablet 40 MG ON MON, WED, AND FRI'S; THEN 20 MG ALL OTHER DAYS  . gabapentin (NEURONTIN) 100 MG capsule TAKE (2) CAPSULES TWICE DAILY.  . isosorbide mononitrate (IMDUR) 30 MG 24 hr tablet Take 0.5 tablets (15 mg total) by mouth daily.  Marland Kitchen losartan (COZAAR) 25 MG tablet Take 1 tablet (25 mg total) by mouth daily.  . meclizine (ANTIVERT) 32 MG tablet Take 1 tablet (32 mg total) by mouth 3 (three) times daily as needed.  . megestrol (MEGACE) 20 MG tablet TAKE 1 TABLET ONCE A DAY  . metoprolol succinate (TOPROL-XL) 50 MG 24 hr tablet TAKE (1) TABLET TWICE A DAY.  . mupirocin ointment (BACTROBAN) 2 % Apply small amt with qtip to wound after soaking TID  . nitroGLYCERIN (NITROSTAT) 0.4 MG SL tablet Place 1 tablet (0.4 mg total) under the tongue every 5 (five) minutes as needed for chest pain (up to 3 doses).  . potassium chloride SA (K-DUR,KLOR-CON) 20 MEQ tablet TAKE (2) TABLETS DAILY  . simvastatin (ZOCOR) 10 MG tablet Take 10 mg by mouth every morning.  . simvastatin (ZOCOR) 10 MG tablet TAKE 1 TABLET DAILY  . spironolactone (ALDACTONE) 25 MG tablet Take 25  mg by mouth every morning.  Marland Kitchen spironolactone (ALDACTONE) 25 MG tablet TAKE 1 TABLET DAILY  . tamsulosin (FLOMAX) 0.4 MG CAPS capsule TAKE (1) CAPSULE DAILY  . ULORIC 40 MG tablet TAKE 1 TABLET DAILY  . warfarin (COUMADIN) 2.5 MG tablet TAKE UP TO 3 TABLETS DAILY AS DIRECTED BY CPP  . [DISCONTINUED] cephALEXin (KEFLEX) 500 MG capsule Take 1 capsule (500 mg total) by mouth 3 (three) times daily.    Review of Systems  Constitutional: Negative.   HENT: Negative.   Eyes: Negative.   Respiratory: Negative.     Cardiovascular: Negative.   Gastrointestinal: Negative.   Endocrine: Negative.   Genitourinary: Negative.   Musculoskeletal: Negative.   Skin: Positive for wound (left foot).  Allergic/Immunologic: Negative.   Neurological: Negative.   Hematological: Negative.   Psychiatric/Behavioral: Negative.        Objective:   Physical Exam  Constitutional: He is oriented to person, place, and time. He appears well-developed and well-nourished. No distress.  Musculoskeletal: Normal range of motion. He exhibits edema.  There is slight swelling of the left foot compared to the right foot and may be less tenderness today than previously.  Neurological: He is alert and oriented to person, place, and time.  Skin: Skin is warm and dry. No rash noted. There is erythema. No pallor.  There is minimal erythema today compared to previously of the dorsal foot and between the fourth and fifth toes. There is certainly less drainage today and less tenderness than previously. The antibiotic was changed because the culture came back saying the bacteria growing are more sensitive to doxycycline.  Psychiatric: He has a normal mood and affect. His behavior is normal. Judgment and thought content normal.  Nursing note and vitals reviewed.  BP 135/70 mmHg  Pulse 73  Temp(Src) 99.1 F (37.3 C) (Oral)  Ht 5' 4.5" (1.638 m)  Wt 169 lb (76.658 kg)  BMI 28.57 kg/m2        Assessment & Plan:  1. Cellulitis of left lower extremity -This appears to be somewhat improved compared to when this was previously seen. -The culture came back with better sensitivity to doxycycline so the patient was switched to this and the Keflex will be discontinued -Also the patient will be asked to soak a piece of gauze and Betadine solution and placed this between the fourth and fifth toes 3 or 4 times daily. - Ambulatory referral to Home Health  2. Open wound of left foot, subsequent encounter -Take new antibiotic as directed -  Ambulatory referral to [facility]  No orders of the defined types were placed in this encounter.   Patient Instructions  Home health will be checking on the foot and making sure that the area does not progress and get worse as far as infection is concerned Use the gauze between the toes soaked with Betadine 3-4 times daily Take the new antibiotic, doxycycline twice daily with food Return to clinic in about 10 days for recheck   Arrie Senate MD

## 2014-08-06 NOTE — Patient Instructions (Signed)
Home health will be checking on the foot and making sure that the area does not progress and get worse as far as infection is concerned Use the gauze between the toes soaked with Betadine 3-4 times daily Take the new antibiotic, doxycycline twice daily with food Return to clinic in about 10 days for recheck

## 2014-08-09 DIAGNOSIS — L03116 Cellulitis of left lower limb: Secondary | ICD-10-CM | POA: Diagnosis not present

## 2014-08-09 DIAGNOSIS — I252 Old myocardial infarction: Secondary | ICD-10-CM | POA: Diagnosis not present

## 2014-08-09 DIAGNOSIS — S91105D Unspecified open wound of left lesser toe(s) without damage to nail, subsequent encounter: Secondary | ICD-10-CM | POA: Diagnosis not present

## 2014-08-09 DIAGNOSIS — Z86711 Personal history of pulmonary embolism: Secondary | ICD-10-CM | POA: Diagnosis not present

## 2014-08-09 DIAGNOSIS — I10 Essential (primary) hypertension: Secondary | ICD-10-CM | POA: Diagnosis not present

## 2014-08-09 DIAGNOSIS — I429 Cardiomyopathy, unspecified: Secondary | ICD-10-CM | POA: Diagnosis not present

## 2014-08-09 DIAGNOSIS — I2510 Atherosclerotic heart disease of native coronary artery without angina pectoris: Secondary | ICD-10-CM | POA: Diagnosis not present

## 2014-08-09 DIAGNOSIS — I5022 Chronic systolic (congestive) heart failure: Secondary | ICD-10-CM | POA: Diagnosis not present

## 2014-08-10 DIAGNOSIS — L03116 Cellulitis of left lower limb: Secondary | ICD-10-CM | POA: Diagnosis not present

## 2014-08-10 DIAGNOSIS — I2510 Atherosclerotic heart disease of native coronary artery without angina pectoris: Secondary | ICD-10-CM | POA: Diagnosis not present

## 2014-08-10 DIAGNOSIS — I5022 Chronic systolic (congestive) heart failure: Secondary | ICD-10-CM | POA: Diagnosis not present

## 2014-08-10 DIAGNOSIS — S91105D Unspecified open wound of left lesser toe(s) without damage to nail, subsequent encounter: Secondary | ICD-10-CM | POA: Diagnosis not present

## 2014-08-10 DIAGNOSIS — I10 Essential (primary) hypertension: Secondary | ICD-10-CM | POA: Diagnosis not present

## 2014-08-10 DIAGNOSIS — I429 Cardiomyopathy, unspecified: Secondary | ICD-10-CM | POA: Diagnosis not present

## 2014-08-12 DIAGNOSIS — I2510 Atherosclerotic heart disease of native coronary artery without angina pectoris: Secondary | ICD-10-CM | POA: Diagnosis not present

## 2014-08-12 DIAGNOSIS — S91105D Unspecified open wound of left lesser toe(s) without damage to nail, subsequent encounter: Secondary | ICD-10-CM | POA: Diagnosis not present

## 2014-08-12 DIAGNOSIS — I10 Essential (primary) hypertension: Secondary | ICD-10-CM | POA: Diagnosis not present

## 2014-08-12 DIAGNOSIS — L03116 Cellulitis of left lower limb: Secondary | ICD-10-CM | POA: Diagnosis not present

## 2014-08-12 DIAGNOSIS — I5022 Chronic systolic (congestive) heart failure: Secondary | ICD-10-CM | POA: Diagnosis not present

## 2014-08-12 DIAGNOSIS — I429 Cardiomyopathy, unspecified: Secondary | ICD-10-CM | POA: Diagnosis not present

## 2014-08-13 DIAGNOSIS — I10 Essential (primary) hypertension: Secondary | ICD-10-CM | POA: Diagnosis not present

## 2014-08-13 DIAGNOSIS — I429 Cardiomyopathy, unspecified: Secondary | ICD-10-CM | POA: Diagnosis not present

## 2014-08-13 DIAGNOSIS — S91105D Unspecified open wound of left lesser toe(s) without damage to nail, subsequent encounter: Secondary | ICD-10-CM | POA: Diagnosis not present

## 2014-08-13 DIAGNOSIS — L03116 Cellulitis of left lower limb: Secondary | ICD-10-CM | POA: Diagnosis not present

## 2014-08-13 DIAGNOSIS — I5022 Chronic systolic (congestive) heart failure: Secondary | ICD-10-CM | POA: Diagnosis not present

## 2014-08-13 DIAGNOSIS — I2510 Atherosclerotic heart disease of native coronary artery without angina pectoris: Secondary | ICD-10-CM | POA: Diagnosis not present

## 2014-08-14 DIAGNOSIS — L03116 Cellulitis of left lower limb: Secondary | ICD-10-CM | POA: Diagnosis not present

## 2014-08-14 DIAGNOSIS — I10 Essential (primary) hypertension: Secondary | ICD-10-CM | POA: Diagnosis not present

## 2014-08-14 DIAGNOSIS — I2510 Atherosclerotic heart disease of native coronary artery without angina pectoris: Secondary | ICD-10-CM | POA: Diagnosis not present

## 2014-08-14 DIAGNOSIS — I5022 Chronic systolic (congestive) heart failure: Secondary | ICD-10-CM | POA: Diagnosis not present

## 2014-08-14 DIAGNOSIS — I429 Cardiomyopathy, unspecified: Secondary | ICD-10-CM | POA: Diagnosis not present

## 2014-08-14 DIAGNOSIS — S91105D Unspecified open wound of left lesser toe(s) without damage to nail, subsequent encounter: Secondary | ICD-10-CM | POA: Diagnosis not present

## 2014-08-15 ENCOUNTER — Ambulatory Visit (INDEPENDENT_AMBULATORY_CARE_PROVIDER_SITE_OTHER): Payer: Medicare Other | Admitting: Family Medicine

## 2014-08-15 ENCOUNTER — Encounter: Payer: Self-pay | Admitting: Family Medicine

## 2014-08-15 VITALS — BP 117/57 | HR 62 | Temp 97.1°F | Ht 64.5 in | Wt 166.0 lb

## 2014-08-15 DIAGNOSIS — I2699 Other pulmonary embolism without acute cor pulmonale: Secondary | ICD-10-CM | POA: Diagnosis not present

## 2014-08-15 DIAGNOSIS — I82403 Acute embolism and thrombosis of unspecified deep veins of lower extremity, bilateral: Secondary | ICD-10-CM | POA: Diagnosis not present

## 2014-08-15 DIAGNOSIS — L03116 Cellulitis of left lower limb: Secondary | ICD-10-CM

## 2014-08-15 LAB — POCT INR: INR: 5.7

## 2014-08-15 MED ORDER — DOXYCYCLINE HYCLATE 100 MG PO TABS: 100.0000 mg | ORAL_TABLET | Freq: Two times a day (BID) | ORAL | Status: DC

## 2014-08-15 NOTE — Addendum Note (Signed)
Addended by: Ilean China on: 08/15/2014 05:09 PM   Modules accepted: Level of Service

## 2014-08-15 NOTE — Patient Instructions (Addendum)
Continue salt water soaks or [place] soaks at least 3 times daily for 20 minutes Cleansed with Betadine solution and try to keep toes separated and do not wear support hose Have home health nurse check area between toes at least twice weekly Continue to take antibiotic Recheck foot and pro time again in 2 weeks  Hold Coumadin today and tomorrow and return on Saturday for repeat INR and further instructions.

## 2014-08-15 NOTE — Progress Notes (Signed)
Subjective:    Patient ID: Scott [name], male    DOB: 1921-09-10, 81 y.o.   MRN: [id number]  HPI 81 year old male comes in today to follow up on cellulitis to left foot. He states that it is improving although there is still some pain and swelling in his left lower leg and foot. He is taking doxycyline without any problems. He normally wears support hose at home but didn't put them on today.  Patient Active Problem List   Diagnosis Date Noted  . NSTEMI (non-ST elevated myocardial infarction) 10/09/2013  . High risk medication use 05/17/2013  . BPH (benign prostatic hyperplasia) 05/17/2013  . Vitamin D deficiency 05/17/2013  . Chronic anticoagulation- subtheraputic INR on admission 04/19/2013  . Acute pulmonary embolism 04/17/2013  . NSVT (nonsustained ventricular tachycardia)- not an ICD candidate 04/17/2013  . NICM (nonischemic cardiomyopathy)- EF 20-25% by Echo 04/12/13 04/17/2013  . Elevated troponin 04/10/2013  . Chronic systolic heart failure 86/38/1771  . DVT of lower extremity, bilateral 03/15/2013  . Fatigue 11/02/2012  . Constipation 11/02/2012  . Alcohol abuse 08/27/2012  . Syncope 06/21/2012  . Bladder cancer 03/15/2012  . Benign localized hyperplasia of prostate with urinary obstruction 03/15/2012  . HTN (hypertension)   . Hyperlipidemia   . PVCs (premature ventricular contractions)   . CAD- non obstructive disease by cath 3/14    Outpatient Encounter Prescriptions as of 08/15/2014  Medication Sig  . donepezil (ARICEPT) 10 MG tablet TAKE ONE TABLET AT BEDTIME  . doxycycline (VIBRA-TABS) 100 MG tablet Take 1 tablet (100 mg total) by mouth 2 (two) times daily. With food  . fluticasone (FLONASE) 50 MCG/ACT nasal spray Place 2 sprays into both nostrils daily.  . furosemide (LASIX) 40 MG tablet 40 MG ON MON, WED, AND FRI'S; THEN 20 MG ALL OTHER DAYS  . gabapentin (NEURONTIN) 100 MG capsule TAKE (2) CAPSULES TWICE DAILY.  . isosorbide mononitrate (IMDUR) 30 MG 24 hr tablet  Take 0.5 tablets (15 mg total) by mouth daily.  Marland Kitchen losartan (COZAAR) 25 MG tablet Take 1 tablet (25 mg total) by mouth daily.  . meclizine (ANTIVERT) 32 MG tablet Take 1 tablet (32 mg total) by mouth 3 (three) times daily as needed.  . megestrol (MEGACE) 20 MG tablet TAKE 1 TABLET ONCE A DAY  . metoprolol succinate (TOPROL-XL) 50 MG 24 hr tablet TAKE (1) TABLET TWICE A DAY.  . mupirocin ointment (BACTROBAN) 2 % Apply small amt with qtip to wound after soaking TID  . nitroGLYCERIN (NITROSTAT) 0.4 MG SL tablet Place 1 tablet (0.4 mg total) under the tongue every 5 (five) minutes as needed for chest pain (up to 3 doses).  . potassium chloride SA (K-DUR,KLOR-CON) 20 MEQ tablet TAKE (2) TABLETS DAILY  . simvastatin (ZOCOR) 10 MG tablet TAKE 1 TABLET DAILY  . spironolactone (ALDACTONE) 25 MG tablet Take 25 mg by mouth every morning.  Marland Kitchen spironolactone (ALDACTONE) 25 MG tablet TAKE 1 TABLET DAILY  . tamsulosin (FLOMAX) 0.4 MG CAPS capsule TAKE (1) CAPSULE DAILY  . ULORIC 40 MG tablet TAKE 1 TABLET DAILY  . warfarin (COUMADIN) 2.5 MG tablet TAKE UP TO 3 TABLETS DAILY AS DIRECTED BY CPP  . [DISCONTINUED] simvastatin (ZOCOR) 10 MG tablet Take 10 mg by mouth every morning.   No facility-administered encounter medications on file as of 08/15/2014.        Review of Systems  Constitutional: Negative.   HENT: Negative.   Eyes: Negative.   Respiratory: Negative.   Cardiovascular:  Positive for leg swelling (left lower leg).  Endocrine: Negative.   Genitourinary: Negative.   Musculoskeletal: Negative.   Skin:       Left foot pain due to cellulitis   Allergic/Immunologic: Negative.   Neurological: Negative.   Hematological: Negative.   Psychiatric/Behavioral: Negative.        Objective:   Physical Exam BP 117/57 mmHg  Pulse 62  Temp(Src) 97.1 F (36.2 C) (Oral)  Ht 5' 4.5" (1.638 m)  Wt 166 lb (75.297 kg)  BMI 28.06 kg/m2  There appears to be less swelling and edema of the left foot. The  area between the fourth and fifth toes is still very sensitive and appears to be moist but with minimal drainage. After cleansing it with Betadine solution he felt a lot of pain and discomfort. We have done x-rays of this in the past and was no sign of any osteomyelitis.  The home health nurse coordinator was spoken with today and she will call the home health nurses to make sure they're checking his foot at least twice weekly and encouraging him to use the [place] soaks for 20 minutes 3 or 4 times daily and keeping the 2 toes separated as much as possible to allow the healing to occur better     Assessment & Plan:  1. Cellulitis of left lower extremity -The area between the fourth and fifth toes of the left foot continues to remain sensitive but appears to be healing slowly  2. Acute pulmonary embolism -We're checking the pro times more frequently because of taking doxycycline - POCT INR  3. DVT of lower extremity, bilateral - POCT INR  Patient Instructions  Continue salt water soaks or [place] soaks at least 3 times daily for 20 minutes Cleansed with Betadine solution and try to keep toes separated and do not wear support hose Have home health nurse check area between toes at least twice weekly Continue to take antibiotic Recheck foot and pro time again in 2 weeks   Arrie Senate MD

## 2014-08-16 DIAGNOSIS — I429 Cardiomyopathy, unspecified: Secondary | ICD-10-CM | POA: Diagnosis not present

## 2014-08-16 DIAGNOSIS — I5022 Chronic systolic (congestive) heart failure: Secondary | ICD-10-CM | POA: Diagnosis not present

## 2014-08-16 DIAGNOSIS — I10 Essential (primary) hypertension: Secondary | ICD-10-CM | POA: Diagnosis not present

## 2014-08-16 DIAGNOSIS — S91105D Unspecified open wound of left lesser toe(s) without damage to nail, subsequent encounter: Secondary | ICD-10-CM | POA: Diagnosis not present

## 2014-08-16 DIAGNOSIS — I2510 Atherosclerotic heart disease of native coronary artery without angina pectoris: Secondary | ICD-10-CM | POA: Diagnosis not present

## 2014-08-16 DIAGNOSIS — L03116 Cellulitis of left lower limb: Secondary | ICD-10-CM | POA: Diagnosis not present

## 2014-08-17 ENCOUNTER — Ambulatory Visit (INDEPENDENT_AMBULATORY_CARE_PROVIDER_SITE_OTHER): Payer: Medicare Other | Admitting: Family Medicine

## 2014-08-17 VITALS — BP 124/52 | HR 60 | Temp 98.3°F | Ht 64.5 in | Wt 166.0 lb

## 2014-08-17 DIAGNOSIS — I82403 Acute embolism and thrombosis of unspecified deep veins of lower extremity, bilateral: Secondary | ICD-10-CM

## 2014-08-17 DIAGNOSIS — D689 Coagulation defect, unspecified: Secondary | ICD-10-CM | POA: Diagnosis not present

## 2014-08-17 DIAGNOSIS — R791 Abnormal coagulation profile: Secondary | ICD-10-CM

## 2014-08-17 DIAGNOSIS — I2699 Other pulmonary embolism without acute cor pulmonale: Secondary | ICD-10-CM | POA: Diagnosis not present

## 2014-08-17 LAB — POCT INR: INR: 4.5

## 2014-08-17 NOTE — Progress Notes (Signed)
Subjective:    Patient ID: Scott [name], male    DOB: 24-Oct-1921, 81 y.o.   MRN: [id number]  HPI Patient here today for follow up on left lower extremity cellulitis. He is accompanied today by his son, [name]. The patient was here only for an INR today because we had to hold his Coumadin since the last visit due to the INR being 5.       Patient Active Problem List   Diagnosis Date Noted  . NSTEMI (non-ST elevated myocardial infarction) 10/09/2013  . High risk medication use 05/17/2013  . BPH (benign prostatic hyperplasia) 05/17/2013  . Vitamin D deficiency 05/17/2013  . Chronic anticoagulation- subtheraputic INR on admission 04/19/2013  . Acute pulmonary embolism 04/17/2013  . NSVT (nonsustained ventricular tachycardia)- not an ICD candidate 04/17/2013  . NICM (nonischemic cardiomyopathy)- EF 20-25% by Echo 04/12/13 04/17/2013  . Elevated troponin 04/10/2013  . Chronic systolic heart failure 44/31/5400  . DVT of lower extremity, bilateral 03/15/2013  . Fatigue 11/02/2012  . Constipation 11/02/2012  . Alcohol abuse 08/27/2012  . Syncope 06/21/2012  . Bladder cancer 03/15/2012  . Benign localized hyperplasia of prostate with urinary obstruction 03/15/2012  . HTN (hypertension)   . Hyperlipidemia   . PVCs (premature ventricular contractions)   . CAD- non obstructive disease by cath 3/14    Outpatient Encounter Prescriptions as of 08/17/2014  Medication Sig  . donepezil (ARICEPT) 10 MG tablet TAKE ONE TABLET AT BEDTIME  . doxycycline (VIBRA-TABS) 100 MG tablet Take 1 tablet (100 mg total) by mouth 2 (two) times daily. With food  . fluticasone (FLONASE) 50 MCG/ACT nasal spray Place 2 sprays into both nostrils daily.  . furosemide (LASIX) 40 MG tablet 40 MG ON MON, WED, AND FRI'S; THEN 20 MG ALL OTHER DAYS  . gabapentin (NEURONTIN) 100 MG capsule TAKE (2) CAPSULES TWICE DAILY.  . isosorbide mononitrate (IMDUR) 30 MG 24 hr tablet Take 0.5 tablets (15 mg total) by mouth daily.  Marland Kitchen  losartan (COZAAR) 25 MG tablet Take 1 tablet (25 mg total) by mouth daily.  . meclizine (ANTIVERT) 32 MG tablet Take 1 tablet (32 mg total) by mouth 3 (three) times daily as needed.  . megestrol (MEGACE) 20 MG tablet TAKE 1 TABLET ONCE A DAY  . metoprolol succinate (TOPROL-XL) 50 MG 24 hr tablet TAKE (1) TABLET TWICE A DAY.  . mupirocin ointment (BACTROBAN) 2 % Apply small amt with qtip to wound after soaking TID  . nitroGLYCERIN (NITROSTAT) 0.4 MG SL tablet Place 1 tablet (0.4 mg total) under the tongue every 5 (five) minutes as needed for chest pain (up to 3 doses).  . potassium chloride SA (K-DUR,KLOR-CON) 20 MEQ tablet TAKE (2) TABLETS DAILY  . simvastatin (ZOCOR) 10 MG tablet TAKE 1 TABLET DAILY  . spironolactone (ALDACTONE) 25 MG tablet TAKE 1 TABLET DAILY  . tamsulosin (FLOMAX) 0.4 MG CAPS capsule TAKE (1) CAPSULE DAILY  . ULORIC 40 MG tablet TAKE 1 TABLET DAILY  . warfarin (COUMADIN) 2.5 MG tablet TAKE UP TO 3 TABLETS DAILY AS DIRECTED BY CPP  . [DISCONTINUED] spironolactone (ALDACTONE) 25 MG tablet Take 25 mg by mouth every morning.   No facility-administered encounter medications on file as of 08/17/2014.     Review of Systems  Constitutional: Negative.   HENT: Negative.   Eyes: Negative.   Respiratory: Negative.   Cardiovascular: Negative.   Gastrointestinal: Negative.   Endocrine: Negative.   Genitourinary: Negative.   Musculoskeletal: Negative.   Skin: Positive for wound (  cellulitis - left lower extremity).  Allergic/Immunologic: Negative.   Neurological: Negative.   Hematological: Negative.   Psychiatric/Behavioral: Negative.        Objective:   Physical Exam BP 124/52 mmHg  Pulse 60  Temp(Src) 98.3 F (36.8 C) (Oral)  Ht 5' 4.5" (1.638 m)  Wt 166 lb (75.297 kg)  BMI 28.06 kg/m2  There was still slight redness and swelling of the foot today and the patient says it may be slightly better. The INR remains elevated but not as high as 2 days ago. It is now  4.5. The patient and his son were informed of this and that he was not to take any more Coumadin until rechecked again in a couple of days.     Assessment & Plan:  1. Pro time increased - Continue to hold Coumadin until pro time is rechecked again on Monday -Continue doxycycline -Continue foot treatment and soaks  Arrie Senate MD    .

## 2014-08-17 NOTE — Patient Instructions (Signed)
Return Monday for protime - hold coumadin until we recheck level.

## 2014-08-19 ENCOUNTER — Other Ambulatory Visit: Payer: Medicare Other

## 2014-08-19 ENCOUNTER — Ambulatory Visit (INDEPENDENT_AMBULATORY_CARE_PROVIDER_SITE_OTHER): Payer: Medicare Other | Admitting: Pharmacist

## 2014-08-19 DIAGNOSIS — I2699 Other pulmonary embolism without acute cor pulmonale: Secondary | ICD-10-CM

## 2014-08-19 DIAGNOSIS — I82403 Acute embolism and thrombosis of unspecified deep veins of lower extremity, bilateral: Secondary | ICD-10-CM | POA: Diagnosis not present

## 2014-08-19 LAB — POCT INR: INR: 2.5

## 2014-08-19 NOTE — Patient Instructions (Addendum)
Anticoagulation Dose Instructions as of 08/19/2014      Scott [name] Tue Wed Thu Fri Sat   New Dose 5 mg 7.5 mg 5 mg 7.5 mg 5 mg 7.5 mg 5 mg    Description        Start new dose of warfarin - take 2 tablets on tuesdays, thursdays, saturdays and sundays.  Take 3 tablets on Mondays, Wednesdays and Fridays.      INR was 2.5 today

## 2014-08-20 DIAGNOSIS — I10 Essential (primary) hypertension: Secondary | ICD-10-CM | POA: Diagnosis not present

## 2014-08-20 DIAGNOSIS — S91105D Unspecified open wound of left lesser toe(s) without damage to nail, subsequent encounter: Secondary | ICD-10-CM | POA: Diagnosis not present

## 2014-08-20 DIAGNOSIS — I5022 Chronic systolic (congestive) heart failure: Secondary | ICD-10-CM | POA: Diagnosis not present

## 2014-08-20 DIAGNOSIS — I2510 Atherosclerotic heart disease of native coronary artery without angina pectoris: Secondary | ICD-10-CM | POA: Diagnosis not present

## 2014-08-20 DIAGNOSIS — L03116 Cellulitis of left lower limb: Secondary | ICD-10-CM | POA: Diagnosis not present

## 2014-08-20 DIAGNOSIS — I429 Cardiomyopathy, unspecified: Secondary | ICD-10-CM | POA: Diagnosis not present

## 2014-08-22 DIAGNOSIS — S91105D Unspecified open wound of left lesser toe(s) without damage to nail, subsequent encounter: Secondary | ICD-10-CM | POA: Diagnosis not present

## 2014-08-22 DIAGNOSIS — L03116 Cellulitis of left lower limb: Secondary | ICD-10-CM | POA: Diagnosis not present

## 2014-08-22 DIAGNOSIS — I2510 Atherosclerotic heart disease of native coronary artery without angina pectoris: Secondary | ICD-10-CM | POA: Diagnosis not present

## 2014-08-22 DIAGNOSIS — I10 Essential (primary) hypertension: Secondary | ICD-10-CM | POA: Diagnosis not present

## 2014-08-22 DIAGNOSIS — I429 Cardiomyopathy, unspecified: Secondary | ICD-10-CM | POA: Diagnosis not present

## 2014-08-22 DIAGNOSIS — I5022 Chronic systolic (congestive) heart failure: Secondary | ICD-10-CM | POA: Diagnosis not present

## 2014-08-23 DIAGNOSIS — I429 Cardiomyopathy, unspecified: Secondary | ICD-10-CM | POA: Diagnosis not present

## 2014-08-23 DIAGNOSIS — I2510 Atherosclerotic heart disease of native coronary artery without angina pectoris: Secondary | ICD-10-CM | POA: Diagnosis not present

## 2014-08-23 DIAGNOSIS — I5022 Chronic systolic (congestive) heart failure: Secondary | ICD-10-CM | POA: Diagnosis not present

## 2014-08-23 DIAGNOSIS — L03116 Cellulitis of left lower limb: Secondary | ICD-10-CM | POA: Diagnosis not present

## 2014-08-23 DIAGNOSIS — I10 Essential (primary) hypertension: Secondary | ICD-10-CM | POA: Diagnosis not present

## 2014-08-23 DIAGNOSIS — S91105D Unspecified open wound of left lesser toe(s) without damage to nail, subsequent encounter: Secondary | ICD-10-CM | POA: Diagnosis not present

## 2014-08-26 ENCOUNTER — Ambulatory Visit (INDEPENDENT_AMBULATORY_CARE_PROVIDER_SITE_OTHER): Payer: Medicare Other | Admitting: Pharmacist

## 2014-08-26 DIAGNOSIS — E875 Hyperkalemia: Secondary | ICD-10-CM | POA: Diagnosis not present

## 2014-08-26 DIAGNOSIS — I82403 Acute embolism and thrombosis of unspecified deep veins of lower extremity, bilateral: Secondary | ICD-10-CM | POA: Diagnosis not present

## 2014-08-26 DIAGNOSIS — R748 Abnormal levels of other serum enzymes: Secondary | ICD-10-CM | POA: Diagnosis not present

## 2014-08-26 DIAGNOSIS — I2699 Other pulmonary embolism without acute cor pulmonale: Secondary | ICD-10-CM

## 2014-08-26 DIAGNOSIS — R7989 Other specified abnormal findings of blood chemistry: Secondary | ICD-10-CM

## 2014-08-26 LAB — POCT INR: INR: 1.7

## 2014-08-26 NOTE — Patient Instructions (Signed)
Anticoagulation Dose Instructions as of 08/26/2014      Scott [name] Tue Wed Thu Fri Sat   New Dose 5 mg 7.5 mg 5 mg 7.5 mg 5 mg 7.5 mg 7.5 mg    Description        Change dose to warfarin 2.5mg  - take 2 tablets on sundays, tuesdays and thursdays and 3 tablet all other days.

## 2014-08-26 NOTE — Progress Notes (Signed)
Patient has had ulcer between 4th and 5th toes on the left foot.  Is currently taking doxycycline bid for this.   I looked at ulcer and appearts to be healing well but patient has some pitting edema bialterally.  Reviewed last BMET and serum creatinine was elevate as well as potassium - per notes from 07/02/14 was suppose to abe rechecked in 1 to 2 weeks.   Rechecking BMET today.

## 2014-08-27 DIAGNOSIS — I2510 Atherosclerotic heart disease of native coronary artery without angina pectoris: Secondary | ICD-10-CM | POA: Diagnosis not present

## 2014-08-27 DIAGNOSIS — I5022 Chronic systolic (congestive) heart failure: Secondary | ICD-10-CM | POA: Diagnosis not present

## 2014-08-27 DIAGNOSIS — S91105D Unspecified open wound of left lesser toe(s) without damage to nail, subsequent encounter: Secondary | ICD-10-CM | POA: Diagnosis not present

## 2014-08-27 DIAGNOSIS — I429 Cardiomyopathy, unspecified: Secondary | ICD-10-CM | POA: Diagnosis not present

## 2014-08-27 DIAGNOSIS — I10 Essential (primary) hypertension: Secondary | ICD-10-CM | POA: Diagnosis not present

## 2014-08-27 DIAGNOSIS — L03116 Cellulitis of left lower limb: Secondary | ICD-10-CM | POA: Diagnosis not present

## 2014-08-27 LAB — BMP8+EGFR
BUN/Creatinine Ratio: 29 — ABNORMAL HIGH (ref 10–22)
BUN: 45 mg/dL — ABNORMAL HIGH (ref 10–36)
CO2: 22 mmol/L (ref 18–29)
Calcium: 9.9 mg/dL (ref 8.6–10.2)
Chloride: 102 mmol/L (ref 97–108)
Creatinine, Ser: 1.56 mg/dL — ABNORMAL HIGH (ref 0.76–1.27)
GFR calc Af Amer: 45 mL/min/{1.73_m2} — ABNORMAL LOW (ref >59)
GFR calc non Af Amer: 39 mL/min/{1.73_m2} — ABNORMAL LOW (ref >59)
Glucose: 112 mg/dL — ABNORMAL HIGH (ref 65–99)
Potassium: 4.5 mmol/L (ref 3.5–5.2)
Sodium: 139 mmol/L (ref 134–144)

## 2014-08-30 DIAGNOSIS — I5022 Chronic systolic (congestive) heart failure: Secondary | ICD-10-CM | POA: Diagnosis not present

## 2014-08-30 DIAGNOSIS — I10 Essential (primary) hypertension: Secondary | ICD-10-CM | POA: Diagnosis not present

## 2014-08-30 DIAGNOSIS — I2510 Atherosclerotic heart disease of native coronary artery without angina pectoris: Secondary | ICD-10-CM | POA: Diagnosis not present

## 2014-08-30 DIAGNOSIS — I429 Cardiomyopathy, unspecified: Secondary | ICD-10-CM | POA: Diagnosis not present

## 2014-08-30 DIAGNOSIS — S91105D Unspecified open wound of left lesser toe(s) without damage to nail, subsequent encounter: Secondary | ICD-10-CM | POA: Diagnosis not present

## 2014-08-30 DIAGNOSIS — L03116 Cellulitis of left lower limb: Secondary | ICD-10-CM | POA: Diagnosis not present

## 2014-09-03 DIAGNOSIS — L03116 Cellulitis of left lower limb: Secondary | ICD-10-CM | POA: Diagnosis not present

## 2014-09-03 DIAGNOSIS — S91105D Unspecified open wound of left lesser toe(s) without damage to nail, subsequent encounter: Secondary | ICD-10-CM | POA: Diagnosis not present

## 2014-09-03 DIAGNOSIS — I10 Essential (primary) hypertension: Secondary | ICD-10-CM | POA: Diagnosis not present

## 2014-09-03 DIAGNOSIS — I429 Cardiomyopathy, unspecified: Secondary | ICD-10-CM | POA: Diagnosis not present

## 2014-09-03 DIAGNOSIS — I5022 Chronic systolic (congestive) heart failure: Secondary | ICD-10-CM | POA: Diagnosis not present

## 2014-09-03 DIAGNOSIS — I2510 Atherosclerotic heart disease of native coronary artery without angina pectoris: Secondary | ICD-10-CM | POA: Diagnosis not present

## 2014-09-04 ENCOUNTER — Telehealth: Payer: Self-pay | Admitting: Pharmacist

## 2014-09-04 ENCOUNTER — Ambulatory Visit (INDEPENDENT_AMBULATORY_CARE_PROVIDER_SITE_OTHER): Payer: Medicare Other | Admitting: Pharmacist

## 2014-09-04 DIAGNOSIS — I2699 Other pulmonary embolism without acute cor pulmonale: Secondary | ICD-10-CM | POA: Diagnosis not present

## 2014-09-04 DIAGNOSIS — L97529 Non-pressure chronic ulcer of other part of left foot with unspecified severity: Secondary | ICD-10-CM

## 2014-09-04 DIAGNOSIS — I82403 Acute embolism and thrombosis of unspecified deep veins of lower extremity, bilateral: Secondary | ICD-10-CM

## 2014-09-04 LAB — POCT INR: INR: 4.3

## 2014-09-04 MED ORDER — POTASSIUM CHLORIDE CRYS ER 20 MEQ PO TBCR: 20.0000 meq | EXTENDED_RELEASE_TABLET | Freq: Every day | ORAL | Status: DC

## 2014-09-04 NOTE — Patient Instructions (Signed)
Anticoagulation Dose Instructions as of 09/04/2014      Scott [name] Tue Wed Thu Fri Sat   New Dose 5 mg 7.5 mg 5 mg 5 mg 5 mg 7.5 mg 5 mg    Description        No warfarin today - Wednesday, May 25th or tomorrow - Thursday, May 26th, then decrease dose to 3 tablets on mondays and fridays and 2 tablets all other days.     INR was 4.3 today

## 2014-09-04 NOTE — Telephone Encounter (Signed)
Referral made 

## 2014-09-04 NOTE — Telephone Encounter (Signed)
Please refer this patient to the wound center in [facility] for further evaluation and management as soon as possible

## 2014-09-06 ENCOUNTER — Telehealth: Payer: Self-pay

## 2014-09-06 DIAGNOSIS — S91105D Unspecified open wound of left lesser toe(s) without damage to nail, subsequent encounter: Secondary | ICD-10-CM | POA: Diagnosis not present

## 2014-09-06 DIAGNOSIS — I10 Essential (primary) hypertension: Secondary | ICD-10-CM | POA: Diagnosis not present

## 2014-09-06 DIAGNOSIS — I5022 Chronic systolic (congestive) heart failure: Secondary | ICD-10-CM | POA: Diagnosis not present

## 2014-09-06 DIAGNOSIS — L03116 Cellulitis of left lower limb: Secondary | ICD-10-CM | POA: Diagnosis not present

## 2014-09-06 DIAGNOSIS — I2510 Atherosclerotic heart disease of native coronary artery without angina pectoris: Secondary | ICD-10-CM | POA: Diagnosis not present

## 2014-09-06 DIAGNOSIS — I429 Cardiomyopathy, unspecified: Secondary | ICD-10-CM | POA: Diagnosis not present

## 2014-09-06 NOTE — Telephone Encounter (Signed)
Weighed patient today 164 lbs  On Monday 155 lbs  Taking 40 mg Lasix Monday Wednesday and Friday  20 all other days also taking Spirolactine 25 mg once daily

## 2014-09-06 NOTE — Telephone Encounter (Signed)
Care giver aware  And [facility] aware of change- for pre packs.

## 2014-09-06 NOTE — Telephone Encounter (Signed)
Increased to 40 mg of Lasix every day Elevate legs as much as possible during the day above the level of the heart at least 20 minutes 4 times daily Please make sure that patient has gotten an appointment with the wound clinic for follow-up of his continued infection in his foot

## 2014-09-09 DIAGNOSIS — I2510 Atherosclerotic heart disease of native coronary artery without angina pectoris: Secondary | ICD-10-CM | POA: Diagnosis not present

## 2014-09-09 DIAGNOSIS — I429 Cardiomyopathy, unspecified: Secondary | ICD-10-CM | POA: Diagnosis not present

## 2014-09-09 DIAGNOSIS — I5022 Chronic systolic (congestive) heart failure: Secondary | ICD-10-CM | POA: Diagnosis not present

## 2014-09-09 DIAGNOSIS — S91105D Unspecified open wound of left lesser toe(s) without damage to nail, subsequent encounter: Secondary | ICD-10-CM | POA: Diagnosis not present

## 2014-09-09 DIAGNOSIS — L03116 Cellulitis of left lower limb: Secondary | ICD-10-CM | POA: Diagnosis not present

## 2014-09-09 DIAGNOSIS — I10 Essential (primary) hypertension: Secondary | ICD-10-CM | POA: Diagnosis not present

## 2014-09-10 ENCOUNTER — Ambulatory Visit (INDEPENDENT_AMBULATORY_CARE_PROVIDER_SITE_OTHER): Payer: Medicare Other | Admitting: Family Medicine

## 2014-09-10 DIAGNOSIS — I10 Essential (primary) hypertension: Secondary | ICD-10-CM | POA: Diagnosis not present

## 2014-09-10 DIAGNOSIS — I2510 Atherosclerotic heart disease of native coronary artery without angina pectoris: Secondary | ICD-10-CM

## 2014-09-10 DIAGNOSIS — S91105D Unspecified open wound of left lesser toe(s) without damage to nail, subsequent encounter: Secondary | ICD-10-CM

## 2014-09-10 DIAGNOSIS — L03116 Cellulitis of left lower limb: Secondary | ICD-10-CM | POA: Diagnosis not present

## 2014-09-11 DIAGNOSIS — I10 Essential (primary) hypertension: Secondary | ICD-10-CM | POA: Diagnosis not present

## 2014-09-11 DIAGNOSIS — S91105D Unspecified open wound of left lesser toe(s) without damage to nail, subsequent encounter: Secondary | ICD-10-CM | POA: Diagnosis not present

## 2014-09-11 DIAGNOSIS — I429 Cardiomyopathy, unspecified: Secondary | ICD-10-CM | POA: Diagnosis not present

## 2014-09-11 DIAGNOSIS — L03116 Cellulitis of left lower limb: Secondary | ICD-10-CM | POA: Diagnosis not present

## 2014-09-11 DIAGNOSIS — I5022 Chronic systolic (congestive) heart failure: Secondary | ICD-10-CM | POA: Diagnosis not present

## 2014-09-11 DIAGNOSIS — I2510 Atherosclerotic heart disease of native coronary artery without angina pectoris: Secondary | ICD-10-CM | POA: Diagnosis not present

## 2014-09-12 ENCOUNTER — Other Ambulatory Visit: Payer: Self-pay | Admitting: Internal Medicine

## 2014-09-12 ENCOUNTER — Encounter (HOSPITAL_BASED_OUTPATIENT_CLINIC_OR_DEPARTMENT_OTHER): Payer: Medicare Other | Attending: Internal Medicine

## 2014-09-12 ENCOUNTER — Ambulatory Visit (HOSPITAL_COMMUNITY)
Admission: RE | Admit: 2014-09-12 | Discharge: 2014-09-12 | Disposition: A | Discharge status: Home / Self Care | Payer: Medicare Other | Source: Ambulatory Visit | Attending: Internal Medicine | Admitting: Internal Medicine

## 2014-09-12 DIAGNOSIS — N189 Chronic kidney disease, unspecified: Secondary | ICD-10-CM | POA: Insufficient documentation

## 2014-09-12 DIAGNOSIS — M7989 Other specified soft tissue disorders: Secondary | ICD-10-CM | POA: Diagnosis not present

## 2014-09-12 DIAGNOSIS — I70245 Atherosclerosis of native arteries of left leg with ulceration of other part of foot: Secondary | ICD-10-CM | POA: Insufficient documentation

## 2014-09-12 DIAGNOSIS — I252 Old myocardial infarction: Secondary | ICD-10-CM | POA: Insufficient documentation

## 2014-09-12 DIAGNOSIS — I70202 Unspecified atherosclerosis of native arteries of extremities, left leg: Secondary | ICD-10-CM | POA: Insufficient documentation

## 2014-09-12 DIAGNOSIS — M79672 Pain in left foot: Secondary | ICD-10-CM | POA: Diagnosis not present

## 2014-09-12 DIAGNOSIS — L97521 Non-pressure chronic ulcer of other part of left foot limited to breakdown of skin: Secondary | ICD-10-CM | POA: Diagnosis not present

## 2014-09-12 DIAGNOSIS — M2550 Pain in unspecified joint: Secondary | ICD-10-CM

## 2014-09-12 DIAGNOSIS — I129 Hypertensive chronic kidney disease with stage 1 through stage 4 chronic kidney disease, or unspecified chronic kidney disease: Secondary | ICD-10-CM | POA: Insufficient documentation

## 2014-09-12 DIAGNOSIS — I429 Cardiomyopathy, unspecified: Secondary | ICD-10-CM | POA: Diagnosis not present

## 2014-09-12 DIAGNOSIS — E785 Hyperlipidemia, unspecified: Secondary | ICD-10-CM | POA: Diagnosis not present

## 2014-09-12 DIAGNOSIS — Z86718 Personal history of other venous thrombosis and embolism: Secondary | ICD-10-CM | POA: Diagnosis not present

## 2014-09-12 DIAGNOSIS — I2510 Atherosclerotic heart disease of native coronary artery without angina pectoris: Secondary | ICD-10-CM | POA: Insufficient documentation

## 2014-09-12 DIAGNOSIS — I509 Heart failure, unspecified: Secondary | ICD-10-CM | POA: Diagnosis not present

## 2014-09-12 DIAGNOSIS — L03032 Cellulitis of left toe: Secondary | ICD-10-CM | POA: Diagnosis not present

## 2014-09-12 DIAGNOSIS — Z87891 Personal history of nicotine dependence: Secondary | ICD-10-CM | POA: Insufficient documentation

## 2014-09-12 DIAGNOSIS — Z8551 Personal history of malignant neoplasm of bladder: Secondary | ICD-10-CM | POA: Insufficient documentation

## 2014-09-12 DIAGNOSIS — Z86711 Personal history of pulmonary embolism: Secondary | ICD-10-CM | POA: Diagnosis not present

## 2014-09-13 DIAGNOSIS — I2510 Atherosclerotic heart disease of native coronary artery without angina pectoris: Secondary | ICD-10-CM | POA: Diagnosis not present

## 2014-09-13 DIAGNOSIS — S91105D Unspecified open wound of left lesser toe(s) without damage to nail, subsequent encounter: Secondary | ICD-10-CM | POA: Diagnosis not present

## 2014-09-13 DIAGNOSIS — I10 Essential (primary) hypertension: Secondary | ICD-10-CM | POA: Diagnosis not present

## 2014-09-13 DIAGNOSIS — I5022 Chronic systolic (congestive) heart failure: Secondary | ICD-10-CM | POA: Diagnosis not present

## 2014-09-13 DIAGNOSIS — I429 Cardiomyopathy, unspecified: Secondary | ICD-10-CM | POA: Diagnosis not present

## 2014-09-13 DIAGNOSIS — L03116 Cellulitis of left lower limb: Secondary | ICD-10-CM | POA: Diagnosis not present

## 2014-09-16 DIAGNOSIS — I2510 Atherosclerotic heart disease of native coronary artery without angina pectoris: Secondary | ICD-10-CM | POA: Diagnosis not present

## 2014-09-16 DIAGNOSIS — L03116 Cellulitis of left lower limb: Secondary | ICD-10-CM | POA: Diagnosis not present

## 2014-09-16 DIAGNOSIS — I10 Essential (primary) hypertension: Secondary | ICD-10-CM | POA: Diagnosis not present

## 2014-09-16 DIAGNOSIS — I5022 Chronic systolic (congestive) heart failure: Secondary | ICD-10-CM | POA: Diagnosis not present

## 2014-09-16 DIAGNOSIS — S91105D Unspecified open wound of left lesser toe(s) without damage to nail, subsequent encounter: Secondary | ICD-10-CM | POA: Diagnosis not present

## 2014-09-16 DIAGNOSIS — I429 Cardiomyopathy, unspecified: Secondary | ICD-10-CM | POA: Diagnosis not present

## 2014-09-17 ENCOUNTER — Other Ambulatory Visit: Payer: Self-pay | Admitting: Internal Medicine

## 2014-09-17 ENCOUNTER — Ambulatory Visit (HOSPITAL_COMMUNITY)
Admission: RE | Admit: 2014-09-17 | Discharge: 2014-09-17 | Disposition: A | Discharge status: Home / Self Care | Payer: Medicare Other | Source: Ambulatory Visit | Attending: Vascular Surgery | Admitting: Vascular Surgery

## 2014-09-17 ENCOUNTER — Ambulatory Visit (HOSPITAL_COMMUNITY): Admission: RE | Admit: 2014-09-17 | Payer: Medicare Other | Source: Ambulatory Visit

## 2014-09-17 DIAGNOSIS — L97529 Non-pressure chronic ulcer of other part of left foot with unspecified severity: Secondary | ICD-10-CM

## 2014-09-19 ENCOUNTER — Telehealth: Payer: Self-pay | Admitting: *Deleted

## 2014-09-19 DIAGNOSIS — Z87891 Personal history of nicotine dependence: Secondary | ICD-10-CM | POA: Diagnosis not present

## 2014-09-19 DIAGNOSIS — I129 Hypertensive chronic kidney disease with stage 1 through stage 4 chronic kidney disease, or unspecified chronic kidney disease: Secondary | ICD-10-CM | POA: Diagnosis not present

## 2014-09-19 DIAGNOSIS — Z86718 Personal history of other venous thrombosis and embolism: Secondary | ICD-10-CM | POA: Diagnosis not present

## 2014-09-19 DIAGNOSIS — L97521 Non-pressure chronic ulcer of other part of left foot limited to breakdown of skin: Secondary | ICD-10-CM | POA: Diagnosis not present

## 2014-09-19 DIAGNOSIS — I70245 Atherosclerosis of native arteries of left leg with ulceration of other part of foot: Secondary | ICD-10-CM | POA: Diagnosis not present

## 2014-09-19 DIAGNOSIS — N189 Chronic kidney disease, unspecified: Secondary | ICD-10-CM | POA: Diagnosis not present

## 2014-09-19 MED ORDER — TRAMADOL HCL 50 MG PO TABS: 50.0000 mg | ORAL_TABLET | Freq: Two times a day (BID) | ORAL | Status: DC | PRN

## 2014-09-19 NOTE — Telephone Encounter (Signed)
Pt seen Vascular MD today and they refused to give Garv anything for pain. He is in A lot of pain . Can we write him something - pt waiting.

## 2014-09-19 NOTE — Telephone Encounter (Signed)
Per [facility] print out  Tramadol 50 1/2 QID or 1 whole tab BID - son david aware of how pt needs to take it.  [facility] to Sign

## 2014-09-20 ENCOUNTER — Other Ambulatory Visit: Payer: Self-pay | Admitting: Family Medicine

## 2014-09-23 ENCOUNTER — Encounter: Payer: Self-pay | Admitting: Vascular Surgery

## 2014-09-23 ENCOUNTER — Telehealth: Payer: Self-pay | Admitting: Cardiology

## 2014-09-23 ENCOUNTER — Telehealth: Payer: Self-pay | Admitting: *Deleted

## 2014-09-23 DIAGNOSIS — I5022 Chronic systolic (congestive) heart failure: Secondary | ICD-10-CM | POA: Diagnosis not present

## 2014-09-23 DIAGNOSIS — L03116 Cellulitis of left lower limb: Secondary | ICD-10-CM | POA: Diagnosis not present

## 2014-09-23 DIAGNOSIS — I2510 Atherosclerotic heart disease of native coronary artery without angina pectoris: Secondary | ICD-10-CM | POA: Diagnosis not present

## 2014-09-23 DIAGNOSIS — I429 Cardiomyopathy, unspecified: Secondary | ICD-10-CM | POA: Diagnosis not present

## 2014-09-23 DIAGNOSIS — I10 Essential (primary) hypertension: Secondary | ICD-10-CM | POA: Diagnosis not present

## 2014-09-23 DIAGNOSIS — S91105D Unspecified open wound of left lesser toe(s) without damage to nail, subsequent encounter: Secondary | ICD-10-CM | POA: Diagnosis not present

## 2014-09-23 NOTE — Telephone Encounter (Signed)
What kind of treatment is the patient doing to the wound site since he has been to the wound clinic? I am really afraid with the patient's age to increase the Ultram any higher in dosage other than have him take it more frequently. However for nasal taking it now.?

## 2014-09-23 NOTE — Telephone Encounter (Signed)
Be sure and send all messages from and for Dr. Percival Spanish to M[facility] Pel , nya is Dr. Rosezella [place] new nurse

## 2014-09-23 NOTE — Telephone Encounter (Signed)
Left message with Home Health Nurse to return call and inform us on current foot care and frequency of pain meds

## 2014-09-23 NOTE — Telephone Encounter (Signed)
NeW Message  Pt son calling to speak w/ Rn about pts condition/results from recent vascular test. Please call back and dsicuss.

## 2014-09-23 NOTE — Telephone Encounter (Signed)
Scott [name] from [place] saw patient this AM and states that he was in some pain. He is having bad pain in left leg and around wound area. Son is requesting that something stronger than ultram be given. Please advise and route back so I can contact [facility]

## 2014-09-23 NOTE — Telephone Encounter (Signed)
Spoke with patient son, stated that the matter has been taken care of, he have an appointment for his dad at the vascular center on [facility], told him to keep appt with Dr Percival Spanish at the office in [place]

## 2014-09-24 ENCOUNTER — Ambulatory Visit (HOSPITAL_COMMUNITY)
Admission: RE | Admit: 2014-09-24 | Discharge: 2014-09-24 | Disposition: A | Discharge status: Home / Self Care | Payer: Medicare Other | Source: Ambulatory Visit | Attending: Vascular Surgery | Admitting: Vascular Surgery

## 2014-09-24 ENCOUNTER — Encounter (HOSPITAL_COMMUNITY): Payer: Medicare Other

## 2014-09-24 ENCOUNTER — Encounter: Payer: Self-pay | Admitting: Vascular Surgery

## 2014-09-24 ENCOUNTER — Ambulatory Visit (INDEPENDENT_AMBULATORY_CARE_PROVIDER_SITE_OTHER): Payer: Medicare Other | Admitting: Vascular Surgery

## 2014-09-24 ENCOUNTER — Other Ambulatory Visit: Payer: Self-pay | Admitting: *Deleted

## 2014-09-24 VITALS — BP 112/57 | HR 67 | Resp 16 | Ht 66.0 in | Wt 164.0 lb

## 2014-09-24 DIAGNOSIS — R6889 Other general symptoms and signs: Secondary | ICD-10-CM | POA: Diagnosis not present

## 2014-09-24 DIAGNOSIS — L97529 Non-pressure chronic ulcer of other part of left foot with unspecified severity: Secondary | ICD-10-CM | POA: Insufficient documentation

## 2014-09-24 DIAGNOSIS — I748 Embolism and thrombosis of other arteries: Secondary | ICD-10-CM | POA: Diagnosis not present

## 2014-09-24 DIAGNOSIS — I739 Peripheral vascular disease, unspecified: Secondary | ICD-10-CM | POA: Diagnosis not present

## 2014-09-24 NOTE — Progress Notes (Signed)
Subjective:     Patient ID: Scott [name], male   DOB: 16-Apr-1921, 81 y.o.   MRN: [id number]  HPI this 81 year old male is accompanied by his son and evaluated for ischemic ulcer left foot with severe pain. The patient has had pain and drainage between the fourth and fifth toes of the left foot for at least 3 months documented in the medical records. Over the past 2 weeks he has been at the wound center area did he was referred here for further evaluation.. Patient has no history of diabetes mellitus. According to the son he was ambulating as recent as 2 weeks ago. Patient is currently in a wheelchair. Pain control is a significant problem. The patient's signs wife is an orthopedic surgeon and foot specialist in [place] and I spent 20 minutes speaking with her on the phone regarding this problem. The patient has a history of a non-STEMI in 2014, pulmonary embolus, bilateral DVT, nonischemic cardiomyopathy with an EF of 20% and a history of nonsustained ventricular tachycardia. Was not felt to be a candidate for an ICD.  Past Medical History  Diagnosis Date  . HTN (hypertension)   . Hyperlipidemia   . PVCs (premature ventricular contractions)   . Glaucoma   . PVD (peripheral vascular disease)     Right ABI .75, Left .78 (2006)  . Popliteal artery aneurysm, bilateral DECUMENTED CHRONIC PARTIAL OCCLUSION--  PT DENIES CLAUDICATION OR ANY OTHER SYMPTOMS  . Frequency of urination   . Nocturia   . Bladder cancer 03/15/2012  . Chronic systolic CHF (congestive heart failure)     a. NICM - patent cors 06/2012, EF 40% at that time. b. 03/2013 eval: EF 20-25%.  . DVT, bilateral lower limbs 03/2013    a. Bilat DVT dx 03/2013.  . Pulmonary embolism     a. By CT angio 04/2013.  Marland Kitchen Elevated troponin     a. Adm 12/30-04/2013 - not felt to represent ACS; ?due to PE. b. Patent cors 06/2012.  Marland Kitchen NSVT (nonsustained ventricular tachycardia)     a. NSVT 06/2012; NSVT also seen during 03/2013 adm. b. Med rx. Not candidate  for ICD given adv age.  Marland Kitchen ETOH abuse   . Syncope     a. Felt to be postural syncope related to diuretics 06/2012.  Marland Kitchen Heart murmur   . DVT (deep venous thrombosis)     "I think in both legs"  . Pneumonia     "couple times"  . DDD (degenerative disc disease)   . Arthritis     "all over"  . Chronic back pain   . Depression     History  Substance Use Topics  . Smoking status: Former Smoker -- 1.00 packs/day for 20 years    Types: Cigarettes    Start date: 04/12/1942    Quit date: 04/13/1975  . Smokeless tobacco: Never Used  . Alcohol Use: No     Comment: 10/09/2013 "quit drinking in early June 2015"    Family History  Problem Relation Age of Onset  . Hypertension Mother   . Arthritis Mother   . Lung cancer Sister   . Deep vein thrombosis Father   . Early death Brother     Allergies  Allergen Reactions  . Lipitor [Atorvastatin] Other (See Comments)    unknown     Current outpatient prescriptions:  .  donepezil (ARICEPT) 10 MG tablet, TAKE ONE TABLET AT BEDTIME, Disp: 30 tablet, Rfl: 2 .  doxycycline (VIBRA-TABS) 100 MG tablet, Take  1 tablet (100 mg total) by mouth 2 (two) times daily. With food, Disp: 28 tablet, Rfl: 0 .  fluticasone (FLONASE) 50 MCG/ACT nasal spray, Place 2 sprays into both nostrils daily., Disp: 16 g, Rfl: 6 .  furosemide (LASIX) 40 MG tablet, 40 MG ON MON, WED, AND FRI'S; THEN 20 MG ALL OTHER DAYS, Disp: 30 tablet, Rfl: 5 .  gabapentin (NEURONTIN) 100 MG capsule, TAKE (2) CAPSULES TWICE DAILY., Disp: 120 capsule, Rfl: 2 .  isosorbide mononitrate (IMDUR) 30 MG 24 hr tablet, Take 0.5 tablets (15 mg total) by mouth daily., Disp: 30 tablet, Rfl: 5 .  losartan (COZAAR) 25 MG tablet, Take 1 tablet (25 mg total) by mouth daily., Disp: 90 tablet, Rfl: 3 .  meclizine (ANTIVERT) 32 MG tablet, Take 1 tablet (32 mg total) by mouth 3 (three) times daily as needed., Disp: 30 tablet, Rfl: 0 .  megestrol (MEGACE) 20 MG tablet, TAKE 1 TABLET ONCE A DAY, Disp: 30  tablet, Rfl: 3 .  metoprolol succinate (TOPROL-XL) 50 MG 24 hr tablet, TAKE (1) TABLET TWICE A DAY., Disp: 60 tablet, Rfl: 6 .  mupirocin ointment (BACTROBAN) 2 %, Apply small amt with qtip to wound after soaking TID, Disp: 22 g, Rfl: 0 .  nitroGLYCERIN (NITROSTAT) 0.4 MG SL tablet, Place 1 tablet (0.4 mg total) under the tongue every 5 (five) minutes as needed for chest pain (up to 3 doses)., Disp: 25 tablet, Rfl: 3 .  potassium chloride SA (K-DUR,KLOR-CON) 20 MEQ tablet, Take 1 tablet (20 mEq total) by mouth daily., Disp: 90 tablet, Rfl: 1 .  simvastatin (ZOCOR) 10 MG tablet, TAKE 1 TABLET DAILY, Disp: 30 tablet, Rfl: 4 .  spironolactone (ALDACTONE) 25 MG tablet, TAKE 1 TABLET DAILY, Disp: 30 tablet, Rfl: 3 .  tamsulosin (FLOMAX) 0.4 MG CAPS capsule, TAKE (1) CAPSULE DAILY, Disp: 30 capsule, Rfl: 5 .  traMADol (ULTRAM) 50 MG tablet, Take 1 tablet (50 mg total) by mouth every 12 (twelve) hours as needed., Disp: 30 tablet, Rfl: 0 .  ULORIC 40 MG tablet, TAKE 1 TABLET DAILY, Disp: 30 tablet, Rfl: 4 .  warfarin (COUMADIN) 2.5 MG tablet, TAKE UP TO 3 TABLETS DAILY AS DIRECTED BY CPP, Disp: 90 tablet, Rfl: 2  Filed Vitals:   09/24/14 1235  BP: 112/57  Pulse: 67  Resp: 16  Height: 5\' 6"  (1.676 m)  Weight: 164 lb (74.39 kg)    Body mass index is 26.48 kg/(m^2).          Marland Kitchen   Review of Systems patient has history of multiple problems including DVT, chronic congestive heart failure, low ejection fraction, dyspnea on exertion, bilateral DVTs on chronic anticoagulation with Coumadin. All other systems reviewed with multiple complaints.     Objective:   Physical Exam BP 112/57 mmHg  Pulse 67  Resp 16  Ht 5\' 6"  (1.676 m)  Wt 164 lb (74.39 kg)  BMI 26.48 kg/m2  Gen.-alert and oriented x3 in no apparent distress-in a wheelchair and very frail in appearance HEENT normal for age Lungs no rhonchi or wheezing Cardiovascular regular rhythm no murmurs carotid pulses 3+ palpable no bruits  audible Abdomen soft nontender no palpable masses Musculoskeletal free of  major deformities Skin clear -no rashes Neurologic normal Lower extremities 3+ femoral pulses palpable bilaterally Right leg has absent popliteal and distal pulses with no ischemic ulcers noted Left foot has absent popliteal and distal pulses. There is edema from the knee to the foot at 1-2+. There is a dressing  between the fourth and fifth toe where there is an ulceration with gangrenous changes in the skin surrounding this area. There is no proximal cellulitis.  I reviewed the previous vascular lab studies performed about one week ago which revealed an ABI of 0.34 on the right and 0.57 on the left performed on 09/17/2014. I also ordered a duplex scan of the left leg which I reviewed and interpreted. The patient has severe calcific occlusive disease throughout all vessels in the lower extremity with total occlusion of the superficial femoral artery in the proximal to mid area throughout the below-knee popliteal artery with known occlusion of anterior tibial artery from previous CT angiogram in 2012. There was no visualization of the peroneal artery and a very diseased posterior tibial artery with dampened monophasic flow.       Assessment:     Ischemic ulceration between fourth and fifth toe with surrounding edema and rest pain Nonischemic cardiomyopathy with EF 20% History of non-STEMI History of pulmonary embolus History of bilateral DVT Currently on chronic anticoagulation with Coumadin with some mild renal insufficiency  I spent 45 minutes discussing situation with patient son and daughter-in-law who is an orthopedic surgeon by phone and coordination of care. By recommendation is primary leg amputation. I do not think the patient is a candidate for an open bypass procedure. With extensive occlusion of his superficial femoral popliteal and tibial vessels angioplasty and stenting will not be successful and patient  has renal insufficiency so renal function could be in jeopardy. Patient's all along the orthopedic surgeon Dr. Wynetta Emery is agrees with this assessment.    Plan:     Patient needs left above-knee amputation. Patient and son will check back with the wound center to help coordinate pain management and if they would like for [place] to proceed with left above-knee amputation in the near future we will be happy to coordinate that. Patient would need to be admitted by the hospitalist service to help manage these multiple medical problems

## 2014-09-25 ENCOUNTER — Other Ambulatory Visit: Payer: Self-pay | Admitting: Cardiology

## 2014-09-26 DIAGNOSIS — Z86718 Personal history of other venous thrombosis and embolism: Secondary | ICD-10-CM | POA: Diagnosis not present

## 2014-09-26 DIAGNOSIS — Z87891 Personal history of nicotine dependence: Secondary | ICD-10-CM | POA: Diagnosis not present

## 2014-09-26 DIAGNOSIS — I70245 Atherosclerosis of native arteries of left leg with ulceration of other part of foot: Secondary | ICD-10-CM | POA: Diagnosis not present

## 2014-09-26 DIAGNOSIS — L97521 Non-pressure chronic ulcer of other part of left foot limited to breakdown of skin: Secondary | ICD-10-CM | POA: Diagnosis not present

## 2014-09-26 DIAGNOSIS — N189 Chronic kidney disease, unspecified: Secondary | ICD-10-CM | POA: Diagnosis not present

## 2014-09-26 DIAGNOSIS — I129 Hypertensive chronic kidney disease with stage 1 through stage 4 chronic kidney disease, or unspecified chronic kidney disease: Secondary | ICD-10-CM | POA: Diagnosis not present

## 2014-09-30 ENCOUNTER — Other Ambulatory Visit: Payer: Self-pay | Admitting: Family Medicine

## 2014-09-30 ENCOUNTER — Telehealth: Payer: Self-pay | Admitting: Family Medicine

## 2014-09-30 ENCOUNTER — Other Ambulatory Visit: Payer: Self-pay | Admitting: *Deleted

## 2014-09-30 DIAGNOSIS — I10 Essential (primary) hypertension: Secondary | ICD-10-CM | POA: Diagnosis not present

## 2014-09-30 DIAGNOSIS — I429 Cardiomyopathy, unspecified: Secondary | ICD-10-CM | POA: Diagnosis not present

## 2014-09-30 DIAGNOSIS — I2510 Atherosclerotic heart disease of native coronary artery without angina pectoris: Secondary | ICD-10-CM | POA: Diagnosis not present

## 2014-09-30 DIAGNOSIS — I5022 Chronic systolic (congestive) heart failure: Secondary | ICD-10-CM | POA: Diagnosis not present

## 2014-09-30 DIAGNOSIS — L03116 Cellulitis of left lower limb: Secondary | ICD-10-CM | POA: Diagnosis not present

## 2014-09-30 DIAGNOSIS — S91105D Unspecified open wound of left lesser toe(s) without damage to nail, subsequent encounter: Secondary | ICD-10-CM | POA: Diagnosis not present

## 2014-09-30 MED ORDER — TRAMADOL HCL 50 MG PO TABS: 50.0000 mg | ORAL_TABLET | Freq: Four times a day (QID) | ORAL | Status: DC | PRN

## 2014-09-30 MED ORDER — HYDROCODONE-ACETAMINOPHEN 5-325 MG PO TABS: 1.0000 | ORAL_TABLET | Freq: Four times a day (QID) | ORAL | Status: DC | PRN

## 2014-09-30 NOTE — Telephone Encounter (Signed)
Dr Laurance Flatten wanted to increase the frequency , not change the med its self. [name] stated that his dad was in a lot of pain

## 2014-09-30 NOTE — Telephone Encounter (Signed)
This has been taken care of Appt given 10/09/14 @ 3:30

## 2014-10-03 ENCOUNTER — Emergency Department (HOSPITAL_COMMUNITY): Payer: Medicare Other

## 2014-10-03 ENCOUNTER — Encounter (HOSPITAL_COMMUNITY): Payer: Self-pay | Admitting: *Deleted

## 2014-10-03 ENCOUNTER — Inpatient Hospital Stay (HOSPITAL_COMMUNITY): Payer: Medicare Other

## 2014-10-03 ENCOUNTER — Inpatient Hospital Stay (HOSPITAL_COMMUNITY)
Admission: EM | Admit: 2014-10-03 | Discharge: 2014-10-08 | DRG: 871 | Disposition: A | Discharge status: Home Health | Payer: Medicare Other | Attending: Internal Medicine | Admitting: Internal Medicine

## 2014-10-03 DIAGNOSIS — Z7901 Long term (current) use of anticoagulants: Secondary | ICD-10-CM | POA: Diagnosis not present

## 2014-10-03 DIAGNOSIS — R000 Tachycardia, unspecified: Secondary | ICD-10-CM | POA: Diagnosis present

## 2014-10-03 DIAGNOSIS — T68XXXA Hypothermia, initial encounter: Secondary | ICD-10-CM | POA: Diagnosis present

## 2014-10-03 DIAGNOSIS — Z9119 Patient's noncompliance with other medical treatment and regimen: Secondary | ICD-10-CM | POA: Diagnosis present

## 2014-10-03 DIAGNOSIS — I447 Left bundle-branch block, unspecified: Secondary | ICD-10-CM | POA: Diagnosis present

## 2014-10-03 DIAGNOSIS — Z87891 Personal history of nicotine dependence: Secondary | ICD-10-CM

## 2014-10-03 DIAGNOSIS — I96 Gangrene, not elsewhere classified: Secondary | ICD-10-CM | POA: Diagnosis not present

## 2014-10-03 DIAGNOSIS — A419 Sepsis, unspecified organism: Principal | ICD-10-CM | POA: Diagnosis present

## 2014-10-03 DIAGNOSIS — Z86718 Personal history of other venous thrombosis and embolism: Secondary | ICD-10-CM | POA: Diagnosis not present

## 2014-10-03 DIAGNOSIS — T45515A Adverse effect of anticoagulants, initial encounter: Secondary | ICD-10-CM | POA: Diagnosis present

## 2014-10-03 DIAGNOSIS — I255 Ischemic cardiomyopathy: Secondary | ICD-10-CM | POA: Diagnosis not present

## 2014-10-03 DIAGNOSIS — I509 Heart failure, unspecified: Secondary | ICD-10-CM | POA: Diagnosis not present

## 2014-10-03 DIAGNOSIS — I5022 Chronic systolic (congestive) heart failure: Secondary | ICD-10-CM | POA: Diagnosis present

## 2014-10-03 DIAGNOSIS — D649 Anemia, unspecified: Secondary | ICD-10-CM | POA: Diagnosis present

## 2014-10-03 DIAGNOSIS — Z8249 Family history of ischemic heart disease and other diseases of the circulatory system: Secondary | ICD-10-CM

## 2014-10-03 DIAGNOSIS — I472 Ventricular tachycardia: Secondary | ICD-10-CM | POA: Diagnosis not present

## 2014-10-03 DIAGNOSIS — E875 Hyperkalemia: Secondary | ICD-10-CM | POA: Diagnosis not present

## 2014-10-03 DIAGNOSIS — I480 Paroxysmal atrial fibrillation: Secondary | ICD-10-CM | POA: Diagnosis present

## 2014-10-03 DIAGNOSIS — R1310 Dysphagia, unspecified: Secondary | ICD-10-CM | POA: Diagnosis present

## 2014-10-03 DIAGNOSIS — L03116 Cellulitis of left lower limb: Secondary | ICD-10-CM | POA: Diagnosis present

## 2014-10-03 DIAGNOSIS — I4891 Unspecified atrial fibrillation: Secondary | ICD-10-CM | POA: Insufficient documentation

## 2014-10-03 DIAGNOSIS — H409 Unspecified glaucoma: Secondary | ICD-10-CM | POA: Diagnosis present

## 2014-10-03 DIAGNOSIS — G92 Toxic encephalopathy: Secondary | ICD-10-CM | POA: Diagnosis present

## 2014-10-03 DIAGNOSIS — N390 Urinary tract infection, site not specified: Secondary | ICD-10-CM | POA: Diagnosis present

## 2014-10-03 DIAGNOSIS — I429 Cardiomyopathy, unspecified: Secondary | ICD-10-CM | POA: Diagnosis present

## 2014-10-03 DIAGNOSIS — I7025 Atherosclerosis of native arteries of other extremities with ulceration: Secondary | ICD-10-CM | POA: Diagnosis present

## 2014-10-03 DIAGNOSIS — Z66 Do not resuscitate: Secondary | ICD-10-CM | POA: Diagnosis present

## 2014-10-03 DIAGNOSIS — I2510 Atherosclerotic heart disease of native coronary artery without angina pectoris: Secondary | ICD-10-CM | POA: Diagnosis present

## 2014-10-03 DIAGNOSIS — N170 Acute kidney failure with tubular necrosis: Secondary | ICD-10-CM | POA: Diagnosis present

## 2014-10-03 DIAGNOSIS — Z8551 Personal history of malignant neoplasm of bladder: Secondary | ICD-10-CM | POA: Diagnosis not present

## 2014-10-03 DIAGNOSIS — F1010 Alcohol abuse, uncomplicated: Secondary | ICD-10-CM | POA: Diagnosis present

## 2014-10-03 DIAGNOSIS — Z86711 Personal history of pulmonary embolism: Secondary | ICD-10-CM

## 2014-10-03 DIAGNOSIS — R05 Cough: Secondary | ICD-10-CM | POA: Diagnosis not present

## 2014-10-03 DIAGNOSIS — E872 Acidosis: Secondary | ICD-10-CM | POA: Diagnosis present

## 2014-10-03 DIAGNOSIS — I10 Essential (primary) hypertension: Secondary | ICD-10-CM | POA: Diagnosis present

## 2014-10-03 DIAGNOSIS — J690 Pneumonitis due to inhalation of food and vomit: Secondary | ICD-10-CM | POA: Diagnosis not present

## 2014-10-03 DIAGNOSIS — E785 Hyperlipidemia, unspecified: Secondary | ICD-10-CM | POA: Diagnosis present

## 2014-10-03 DIAGNOSIS — I5042 Chronic combined systolic (congestive) and diastolic (congestive) heart failure: Secondary | ICD-10-CM | POA: Diagnosis present

## 2014-10-03 DIAGNOSIS — R4182 Altered mental status, unspecified: Secondary | ICD-10-CM

## 2014-10-03 DIAGNOSIS — R0602 Shortness of breath: Secondary | ICD-10-CM | POA: Diagnosis not present

## 2014-10-03 DIAGNOSIS — A4189 Other specified sepsis: Secondary | ICD-10-CM | POA: Diagnosis not present

## 2014-10-03 DIAGNOSIS — N179 Acute kidney failure, unspecified: Secondary | ICD-10-CM | POA: Diagnosis present

## 2014-10-03 DIAGNOSIS — Z801 Family history of malignant neoplasm of trachea, bronchus and lung: Secondary | ICD-10-CM

## 2014-10-03 DIAGNOSIS — L97529 Non-pressure chronic ulcer of other part of left foot with unspecified severity: Secondary | ICD-10-CM | POA: Diagnosis not present

## 2014-10-03 DIAGNOSIS — M79672 Pain in left foot: Secondary | ICD-10-CM | POA: Diagnosis not present

## 2014-10-03 DIAGNOSIS — D699 Hemorrhagic condition, unspecified: Secondary | ICD-10-CM

## 2014-10-03 DIAGNOSIS — I739 Peripheral vascular disease, unspecified: Secondary | ICD-10-CM

## 2014-10-03 DIAGNOSIS — R531 Weakness: Secondary | ICD-10-CM | POA: Diagnosis not present

## 2014-10-03 DIAGNOSIS — L98499 Non-pressure chronic ulcer of skin of other sites with unspecified severity: Secondary | ICD-10-CM

## 2014-10-03 DIAGNOSIS — R0989 Other specified symptoms and signs involving the circulatory and respiratory systems: Secondary | ICD-10-CM

## 2014-10-03 DIAGNOSIS — I70209 Unspecified atherosclerosis of native arteries of extremities, unspecified extremity: Secondary | ICD-10-CM | POA: Diagnosis present

## 2014-10-03 DIAGNOSIS — D6832 Hemorrhagic disorder due to extrinsic circulating anticoagulants: Secondary | ICD-10-CM | POA: Diagnosis present

## 2014-10-03 HISTORY — DX: Peripheral vascular disease, unspecified: I73.9

## 2014-10-03 LAB — CBC WITH DIFFERENTIAL/PLATELET
Basophils Absolute: 0 10*3/uL (ref 0.0–0.1)
Basophils Relative: 0 % (ref 0–1)
Eosinophils Absolute: 0.2 10*3/uL (ref 0.0–0.7)
Eosinophils Relative: 3 % (ref 0–5)
HCT: 37.1 % — ABNORMAL LOW (ref 39.0–52.0)
Hemoglobin: 12.2 g/dL — ABNORMAL LOW (ref 13.0–17.0)
Lymphocytes Relative: 9 % — ABNORMAL LOW (ref 12–46)
Lymphs Abs: 0.7 10*3/uL (ref 0.7–4.0)
MCH: 27.4 pg (ref 26.0–34.0)
MCHC: 32.9 g/dL (ref 30.0–36.0)
MCV: 83.4 fL (ref 78.0–100.0)
Monocytes Absolute: 0.7 10*3/uL (ref 0.1–1.0)
Monocytes Relative: 9 % (ref 3–12)
Neutro Abs: 6.1 10*3/uL (ref 1.7–7.7)
Neutrophils Relative %: 79 % — ABNORMAL HIGH (ref 43–77)
Platelets: 262 10*3/uL (ref 150–400)
RBC: 4.45 MIL/uL (ref 4.22–5.81)
RDW: 14.6 % (ref 11.5–15.5)
WBC: 7.6 10*3/uL (ref 4.0–10.5)

## 2014-10-03 LAB — BASIC METABOLIC PANEL
Anion gap: 7 (ref 5–15)
Anion gap: 7 (ref 5–15)
BUN: 74 mg/dL — ABNORMAL HIGH (ref 6–20)
BUN: 65 mg/dL — ABNORMAL HIGH (ref 6–20)
CO2: 26 mmol/L (ref 22–32)
CO2: 27 mmol/L (ref 22–32)
Calcium: 9.7 mg/dL (ref 8.9–10.3)
Calcium: 9.6 mg/dL (ref 8.9–10.3)
Chloride: 101 mmol/L (ref 101–111)
Chloride: 106 mmol/L (ref 101–111)
Creatinine, Ser: 2.83 mg/dL — ABNORMAL HIGH (ref 0.61–1.24)
Creatinine, Ser: 2.17 mg/dL — ABNORMAL HIGH (ref 0.61–1.24)
GFR calc Af Amer: 21 mL/min — ABNORMAL LOW (ref >60)
GFR calc Af Amer: 29 mL/min — ABNORMAL LOW (ref >60)
GFR calc non Af Amer: 18 mL/min — ABNORMAL LOW (ref >60)
GFR calc non Af Amer: 25 mL/min — ABNORMAL LOW (ref >60)
Glucose, Bld: 118 mg/dL — ABNORMAL HIGH (ref 65–99)
Glucose, Bld: 105 mg/dL — ABNORMAL HIGH (ref 65–99)
Potassium: 6 mmol/L — ABNORMAL HIGH (ref 3.5–5.1)
Potassium: 5.6 mmol/L — ABNORMAL HIGH (ref 3.5–5.1)
Sodium: 134 mmol/L — ABNORMAL LOW (ref 135–145)
Sodium: 140 mmol/L (ref 135–145)

## 2014-10-03 LAB — URINALYSIS, ROUTINE W REFLEX MICROSCOPIC
Bilirubin Urine: NEGATIVE
Glucose, UA: NEGATIVE mg/dL
Ketones, ur: NEGATIVE mg/dL
Leukocytes, UA: NEGATIVE
Nitrite: NEGATIVE
Protein, ur: NEGATIVE mg/dL
Specific Gravity, Urine: 1.02 (ref 1.005–1.030)
Urobilinogen, UA: 0.2 mg/dL (ref 0.0–1.0)
pH: 5 (ref 5.0–8.0)

## 2014-10-03 LAB — PROTIME-INR
INR: 7.27 (ref 0.00–1.49)
Prothrombin Time: 59.6 seconds — ABNORMAL HIGH (ref 11.6–15.2)

## 2014-10-03 LAB — LACTIC ACID, PLASMA
Lactic Acid, Venous: 1.3 mmol/L (ref 0.5–2.0)
Lactic Acid, Venous: 2.3 mmol/L (ref 0.5–2.0)
Lactic Acid, Venous: 3.2 mmol/L (ref 0.5–2.0)
Lactic Acid, Venous: 2.4 mmol/L (ref 0.5–2.0)

## 2014-10-03 LAB — URINE MICROSCOPIC-ADD ON

## 2014-10-03 LAB — MRSA PCR SCREENING: MRSA by PCR: POSITIVE — AB

## 2014-10-03 MED ORDER — SIMVASTATIN 10 MG PO TABS
10.0000 mg | ORAL_TABLET | Freq: Every day | ORAL | Status: DC
  Administered 2014-10-04 – 2014-10-08 (×5): 10 mg via ORAL
  Filled 2014-10-03 (×5): qty 1

## 2014-10-03 MED ORDER — CALCIUM GLUCONATE 10 % IV SOLN
1.0000 g | Freq: Once | INTRAVENOUS | Status: AC
  Administered 2014-10-03: 1 g via INTRAVENOUS
  Filled 2014-10-03: qty 10

## 2014-10-03 MED ORDER — TAMSULOSIN HCL 0.4 MG PO CAPS
0.4000 mg | ORAL_CAPSULE | Freq: Every day | ORAL | Status: DC
  Administered 2014-10-04 – 2014-10-08 (×5): 0.4 mg via ORAL
  Filled 2014-10-03 (×5): qty 1

## 2014-10-03 MED ORDER — NITROGLYCERIN 0.4 MG SL SUBL: 0.4000 mg | SUBLINGUAL_TABLET | SUBLINGUAL | Status: DC | PRN

## 2014-10-03 MED ORDER — ALUM & MAG HYDROXIDE-SIMETH 200-200-20 MG/5ML PO SUSP: 30.0000 mL | Freq: Four times a day (QID) | ORAL | Status: DC | PRN

## 2014-10-03 MED ORDER — SODIUM CHLORIDE 0.9 % IV BOLUS (SEPSIS)
250.0000 mL | Freq: Once | INTRAVENOUS | Status: AC
  Administered 2014-10-03: 250 mL via INTRAVENOUS

## 2014-10-03 MED ORDER — PIPERACILLIN SOD-TAZOBACTAM SO 2.25 (2-0.25) G IV SOLR
2.2500 g | Freq: Three times a day (TID) | INTRAVENOUS | Status: DC
  Administered 2014-10-03 – 2014-10-04 (×2): 2.25 g via INTRAVENOUS
  Filled 2014-10-03 (×6): qty 2.25

## 2014-10-03 MED ORDER — OXYCODONE HCL 5 MG PO TABS
5.0000 mg | ORAL_TABLET | ORAL | Status: DC | PRN
  Administered 2014-10-03 – 2014-10-07 (×9): 5 mg via ORAL
  Filled 2014-10-03 (×11): qty 1

## 2014-10-03 MED ORDER — DILTIAZEM HCL 25 MG/5ML IV SOLN
5.0000 mg | Freq: Once | INTRAVENOUS | Status: AC
  Administered 2014-10-03: 5 mg via INTRAVENOUS
  Filled 2014-10-03: qty 5

## 2014-10-03 MED ORDER — PIPERACILLIN-TAZOBACTAM IN DEX 2-0.25 GM/50ML IV SOLN
2.2500 g | Freq: Three times a day (TID) | INTRAVENOUS | Status: DC
  Filled 2014-10-03 (×6): qty 50

## 2014-10-03 MED ORDER — SODIUM BICARBONATE 8.4 % IV SOLN
50.0000 meq | Freq: Once | INTRAVENOUS | Status: AC
  Administered 2014-10-03: 50 meq via INTRAVENOUS
  Filled 2014-10-03: qty 50

## 2014-10-03 MED ORDER — SODIUM POLYSTYRENE SULFONATE 15 GM/60ML PO SUSP
30.0000 g | Freq: Once | ORAL | Status: AC
  Administered 2014-10-03: 30 g via ORAL
  Filled 2014-10-03: qty 120

## 2014-10-03 MED ORDER — DONEPEZIL HCL 5 MG PO TABS
10.0000 mg | ORAL_TABLET | Freq: Every day | ORAL | Status: DC
  Administered 2014-10-03 – 2014-10-07 (×5): 10 mg via ORAL
  Filled 2014-10-03 (×5): qty 2

## 2014-10-03 MED ORDER — ONDANSETRON HCL 4 MG PO TABS: 4.0000 mg | ORAL_TABLET | Freq: Four times a day (QID) | ORAL | Status: DC | PRN

## 2014-10-03 MED ORDER — ISOSORBIDE MONONITRATE ER 30 MG PO TB24
15.0000 mg | ORAL_TABLET | Freq: Every day | ORAL | Status: DC
  Administered 2014-10-04 – 2014-10-08 (×5): 15 mg via ORAL
  Filled 2014-10-03 (×5): qty 1

## 2014-10-03 MED ORDER — VANCOMYCIN HCL 10 G IV SOLR
1500.0000 mg | Freq: Once | INTRAVENOUS | Status: AC
  Administered 2014-10-03: 1500 mg via INTRAVENOUS
  Filled 2014-10-03: qty 1500

## 2014-10-03 MED ORDER — SODIUM CHLORIDE 0.9 % IV BOLUS (SEPSIS)
1000.0000 mL | Freq: Once | INTRAVENOUS | Status: AC
  Administered 2014-10-03: 1000 mL via INTRAVENOUS

## 2014-10-03 MED ORDER — ONDANSETRON HCL 4 MG/2ML IJ SOLN: 4.0000 mg | Freq: Four times a day (QID) | INTRAMUSCULAR | Status: DC | PRN

## 2014-10-03 MED ORDER — ACETAMINOPHEN 325 MG PO TABS: 650.0000 mg | ORAL_TABLET | Freq: Four times a day (QID) | ORAL | Status: DC | PRN

## 2014-10-03 MED ORDER — SODIUM CHLORIDE 0.45 % IV SOLN
INTRAVENOUS | Status: DC
  Administered 2014-10-03 – 2014-10-04 (×2): via INTRAVENOUS
  Filled 2014-10-03 (×4): qty 1000

## 2014-10-03 MED ORDER — FEBUXOSTAT 40 MG PO TABS
40.0000 mg | ORAL_TABLET | Freq: Every day | ORAL | Status: DC
  Administered 2014-10-04 – 2014-10-08 (×5): 40 mg via ORAL
  Filled 2014-10-03 (×7): qty 1

## 2014-10-03 MED ORDER — CHLORHEXIDINE GLUCONATE CLOTH 2 % EX PADS
6.0000 | MEDICATED_PAD | Freq: Every day | CUTANEOUS | Status: AC
  Administered 2014-10-04 – 2014-10-08 (×5): 6 via TOPICAL

## 2014-10-03 MED ORDER — VANCOMYCIN HCL IN DEXTROSE 1-5 GM/200ML-% IV SOLN: 1000.0000 mg | INTRAVENOUS | Status: DC

## 2014-10-03 MED ORDER — FLUTICASONE PROPIONATE 50 MCG/ACT NA SUSP
2.0000 | Freq: Every day | NASAL | Status: DC
  Administered 2014-10-04 – 2014-10-08 (×5): 2 via NASAL
  Filled 2014-10-03: qty 16

## 2014-10-03 MED ORDER — MORPHINE SULFATE 2 MG/ML IJ SOLN: 1.0000 mg | INTRAMUSCULAR | Status: DC | PRN

## 2014-10-03 MED ORDER — ACETAMINOPHEN 650 MG RE SUPP: 650.0000 mg | Freq: Four times a day (QID) | RECTAL | Status: DC | PRN

## 2014-10-03 MED ORDER — GABAPENTIN 100 MG PO CAPS
200.0000 mg | ORAL_CAPSULE | Freq: Two times a day (BID) | ORAL | Status: DC
  Administered 2014-10-04 – 2014-10-05 (×3): 200 mg via ORAL
  Filled 2014-10-03 (×3): qty 2

## 2014-10-03 MED ORDER — GUAIFENESIN-DM 100-10 MG/5ML PO SYRP: 5.0000 mL | ORAL_SOLUTION | ORAL | Status: DC | PRN

## 2014-10-03 MED ORDER — MUPIROCIN 2 % EX OINT
1.0000 "application " | TOPICAL_OINTMENT | Freq: Two times a day (BID) | CUTANEOUS | Status: AC
  Administered 2014-10-03 – 2014-10-08 (×10): 1 via NASAL
  Filled 2014-10-03 (×3): qty 22

## 2014-10-03 MED ORDER — METOPROLOL TARTRATE 1 MG/ML IV SOLN
5.0000 mg | INTRAVENOUS | Status: DC | PRN
  Filled 2014-10-03 (×2): qty 5

## 2014-10-03 MED ORDER — LEVALBUTEROL HCL 0.63 MG/3ML IN NEBU
0.6300 mg | INHALATION_SOLUTION | RESPIRATORY_TRACT | Status: DC | PRN
  Filled 2014-10-03: qty 3

## 2014-10-03 NOTE — Progress Notes (Addendum)
[place] for Vancomycin (pt in ED) Indication: osteomyelitis  Allergies  Allergen Reactions  . Lipitor [Atorvastatin] Other (See Comments)    unknown   Patient Measurements: Height: 5\' 7"  (170.2 cm) Weight: 160 lb (72.576 kg) IBW/kg (Calculated) : 66.1  Vital Signs: Temp: 96.7 F (35.9 C) (06/23 1209) Temp Source: Rectal (06/23 1209) BP: 129/75 mmHg (06/23 1230) Pulse Rate: 88 (06/23 1315)  Labs:  Recent Labs  10/03/14 1129  WBC 7.6  HGB 12.2*  PLT 262  CREATININE 2.83*   Estimated Creatinine Clearance: 15.9 mL/min (by C-G formula based on Cr of 2.83).  No results for input(s): VANCOTROUGH, VANCOPEAK, VANCORANDOM, GENTTROUGH, GENTPEAK, GENTRANDOM, TOBRATROUGH, TOBRAPEAK, TOBRARND, AMIKACINPEAK, AMIKACINTROU, AMIKACIN in the last 72 hours.   Microbiology: No results found for this or any previous visit (from the past 720 hour(s)).  Medical History: Past Medical History  Diagnosis Date  . HTN (hypertension)   . Hyperlipidemia   . PVCs (premature ventricular contractions)   . Glaucoma   . PVD (peripheral vascular disease)     Right ABI .75, Left .78 (2006)  . Popliteal artery aneurysm, bilateral DECUMENTED CHRONIC PARTIAL OCCLUSION--  PT DENIES CLAUDICATION OR ANY OTHER SYMPTOMS  . Frequency of urination   . Nocturia   . Bladder cancer 03/15/2012  . Chronic systolic CHF (congestive heart failure)     a. NICM - patent cors 06/2012, EF 40% at that time. b. 03/2013 eval: EF 20-25%.  . DVT, bilateral lower limbs 03/2013    a. Bilat DVT dx 03/2013.  . Pulmonary embolism     a. By CT angio 04/2013.  Marland Kitchen Elevated troponin     a. Adm 12/30-04/2013 - not felt to represent ACS; ?due to PE. b. Patent cors 06/2012.  Marland Kitchen NSVT (nonsustained ventricular tachycardia)     a. NSVT 06/2012; NSVT also seen during 03/2013 adm. b. Med rx. Not candidate for ICD given adv age.  Marland Kitchen ETOH abuse   . Syncope     a. Felt to be postural syncope related to diuretics  06/2012.  Marland Kitchen Heart murmur   . DVT (deep venous thrombosis)     "I think in both legs"  . Pneumonia     "couple times"  . DDD (degenerative disc disease)   . Arthritis     "all over"  . Chronic back pain   . Depression    Vancomycin 1500mg  IV x 1 on 6/23 in ED  Assessment: 81yo male has been declining x 3 months per son.  Pt c/o left foot pain due to ischemic ulcer on left foot, suspected osteomyelitis.  SCr is elevated on admission (2.83)  Goal of Therapy:  Vancomycin trough level 15-20 mcg/ml  Plan:  Preliminary review of pertinent patient information completed.   Protocol will be initiated with a one-time dose(s) of Vancomycin 1500mg  in ED.   Forestine Na clinical pharmacist will complete review when patient admitted to assess patient and finalize treatment regimen.  Hart Robinsons A, RPH 10/03/2014,1:23 PM   Plan: Vancomycin 1 GM IV every 48 hours, after one dose in ED completed. Next dose due 10/05/14. Monitor renal function Vancomycin trough at steady state Labs per protocol  Gildardo Griffes B, [facility] 10/03/2014 230 PM

## 2014-10-03 NOTE — H&P (Addendum)
[facility] History and Physical  Scott [name] QZR:[id number] DOB: 10/31/1921 DOA: 10/03/2014  Referring physician: ED physician, Dr. Wilson Singer. PCP: Redge Gainer, MD   Chief Complaint: Generalized weakness, malaise, skin ulcer on left foot.  HPI: Scott [name] is a 81 y.o. 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 He presents from home with a report of generalized weakness, malaise, poor appetite, decrease in responsiveness, and worsening skin ulcer between the fourth and fifth toes on the left foot. He has also had progressive left foot pain. When asked, the patient says that he has no pain or complaints at this time. The history is being provided primarily by his niece, [name]. Accordingly, the patient has not been feeling well over the past several days. He has 24-hour caretakers. His caretaker this morning reported that the patient was minimally responsive, had a poor appetite, had a low temperature, and was generally weak. There was no witnessed nausea, vomiting, complaints of chest pain or shortness of breath, or bloody stools. There is no complaint of pain with urination. He was recently evaluated by the wound care clinic in [place] and then subsequently by vascular surgeon, Dr. Kellie Simmering for the progressive PAD and chronic skin ulceration between the left fourth and fifth toe.Marland Kitchen Per his recent note on 09/24/14, Dr. Kellie Simmering recommended an above-the-knee amputation; he did not believe the patient was a candidate for open bypass procedure.  In the ED, the patient was hypothermic with a temperature of 96.6 and intermittently tachycardic with a heart rate ranging from 100 to 130s and with a blood pressure ranging from 354-562 systolically. He was  oxygenating 98%. His EKG revealed atrial fibrillation with RVR and other chronic changes. CT of his head revealed no acute intracranial findings, but with chronic white matter microvascular disease. X-ray of the fifth toe on the left foot revealed diffuse soft tissue swelling involving the fifth toe with air in the soft tissues. His urinalysis revealed many bacteria. His lab data are significant for a sodium of 134, potassium of 6.0, BUN of 74, creatinine of 2.83, lactic acid of 1.3-2.3, normal WBC, and hemoglobin of 12.2. His INR was 7.27.    Review of Systems:  Review of systems is limited secondary to limited information provided by the patient's niece. Review of systems positive as above in history present illness. In addition, the patient has chronic pain in his left foot.   Past Medical History  Diagnosis Date  . HTN (hypertension)   . Hyperlipidemia   . PVCs (premature ventricular contractions)   . Glaucoma   . PVD (peripheral vascular disease)     Right ABI .75, Left .78 (2006)  . Popliteal artery aneurysm, bilateral DECUMENTED CHRONIC PARTIAL OCCLUSION--  PT DENIES CLAUDICATION OR ANY OTHER SYMPTOMS  . Frequency of urination   . Nocturia   . Bladder cancer 03/15/2012  . Chronic systolic CHF (congestive heart failure)     a. NICM - patent cors 06/2012, EF 40% at that time. b. 03/2013 eval: EF 20-25%.  . DVT, bilateral lower limbs 03/2013    a. Bilat DVT dx 03/2013.  . Pulmonary embolism     a. By CT angio 04/2013.  Marland Kitchen Elevated troponin     a. Adm 12/30-04/2013 - not felt to represent ACS; ?due to PE. b. Patent cors 06/2012.  Marland Kitchen NSVT (  nonsustained ventricular tachycardia)     a. NSVT 06/2012; NSVT also seen during 03/2013 adm. b. Med rx. Not candidate for ICD given adv age.  Marland Kitchen ETOH abuse   . Syncope     a. Felt to be postural syncope related to diuretics 06/2012.  Marland Kitchen Heart murmur   . DVT (deep venous thrombosis)     "I think in both legs"  . Pneumonia     "couple times"  . DDD  (degenerative disc disease)   . Arthritis     "all over"  . Chronic back pain   . Depression   . CAD- non obstructive disease by cath 3/14   . PAD (peripheral artery disease)    Past Surgical History  Procedure Laterality Date  . Transthoracic echocardiogram  09-22-2010    MODERATE LVH/ EF 76%/ GRADE I DIASTOLIC DYSFUNCTION/ AORTIC SCLEROSIS WITHOUT STENOSIS/  RV  SYSTOLIC  MILDLY REDUCED FUNCTION  . Cardiovascular stress test  10-15-2010    LOW RISK NUCLEAR STUDY/ NO EVIDENCE OF ISCHEMIA/ NORMAL EF  . Shoulder surgery  1970's    "don't remember which side or what kind of OR"  . Transurethral resection of bladder tumor  10/07/2011    Procedure: TRANSURETHRAL RESECTION OF BLADDER TUMOR (TURBT);  Surgeon: Malka So, MD;  Location: [facility];  Service: Urology;  Laterality: N/A;  . Cystoscopy  10/07/2011    Procedure: CYSTOSCOPY;  Surgeon: Malka So, MD;  Location: [facility];  Service: Urology;  Laterality: N/A;  . Cystoscopy with biopsy  03/14/2012    Procedure: CYSTOSCOPY WITH BIOPSY;  Surgeon: Malka So, MD;  Location: [facility];  Service: Urology;  Laterality: N/A;  WITH FULGURATION  . Transurethral resection of prostate  03/14/2012    Procedure: TRANSURETHRAL RESECTION OF THE PROSTATE WITH GYRUS INSTRUMENTS;  Surgeon: Malka So, MD;  Location: [facility];  Service: Urology;  Laterality: N/A;  . Tonsillectomy    . Cataract extraction w/ intraocular lens  implant, bilateral Bilateral   . Cardiac catheterization  05-11-2004  DR [place] PLAQUE/ NORMAL LVF/ EF 55%/  NON-OBSTRUCTIVE LAD 25%  . Cardiac catheterization  06/2012  . Eye surgery     Social History: He is married. He and his wife live in [place]. They both have 24 hour caregivers. He has one son. He is retired. At baseline, when he is feeling well, he ambulates with a cane and is able to feed himself. He stopped smoking 39 years ago. He denies illicit drug use and alcohol  use.    Allergies  Allergen Reactions  . Lipitor [Atorvastatin] Other (See Comments)    unknown    Family History  Problem Relation Age of Onset  . Hypertension Mother   . Arthritis Mother   . Lung cancer Sister   . Deep vein thrombosis Father   . Early death Brother     Prior to Admission medications   Medication Sig Start Date End Date Taking? Authorizing Provider  donepezil (ARICEPT) 10 MG tablet TAKE ONE TABLET AT BEDTIME 07/10/14  Yes Chipper Herb, MD  fluticasone [facility]) 50 MCG/ACT nasal spray Place 2 sprays into both nostrils daily. 03/26/14  Yes Chipper Herb, MD  furosemide (LASIX) 40 MG tablet 40 MG ON MON, WED, AND FRI'S; THEN 20 MG ALL OTHER DAYS 10/18/13  Yes Liliane Shi, PA-C  gabapentin (NEURONTIN) 100 MG capsule TAKE (2) CAPSULES TWICE DAILY. 07/10/14  Yes Estella Husk  Laurance Flatten, MD  HORSE CHESTNUT PO Take 1 capsule by mouth daily.   Yes Historical Provider, MD  HYDROcodone-acetaminophen (NORCO/VICODIN) 5-325 MG per tablet Take 1 tablet by mouth every 6 (six) hours as needed for moderate pain or severe pain. 09/30/14  Yes Chipper Herb, MD  isosorbide mononitrate (IMDUR) 30 MG 24 hr tablet Take 0.5 tablets (15 mg total) by mouth daily. 10/18/13  Yes Liliane Shi, PA-C  lidocaine (XYLOCAINE) 2 % jelly APPLY AS NEEDED TO THE LEFT FOOD ULCER 09/27/14  Yes Historical Provider, MD  losartan (COZAAR) 25 MG tablet Take 1 tablet (25 mg total) by mouth daily. 11/27/13  Yes Minus Breeding, MD  metoprolol succinate (TOPROL-XL) 50 MG 24 hr tablet TAKE (1) TABLET TWICE A DAY. 09/26/14  Yes Minus Breeding, MD  nitroGLYCERIN (NITROSTAT) 0.4 MG SL tablet Place 1 tablet (0.4 mg total) under the tongue every 5 (five) minutes as needed for chest pain (up to 3 doses). 04/19/13  Yes Dayna N Dunn, PA-C  potassium chloride SA (K-DUR,KLOR-CON) 20 MEQ tablet Take 1 tablet (20 mEq total) by mouth daily. 09/04/14  Yes Chipper Herb, MD  simvastatin (ZOCOR) 10 MG tablet TAKE 1 TABLET DAILY 07/25/14  Yes  Chipper Herb, MD  spironolactone (ALDACTONE) 25 MG tablet TAKE 1 TABLET DAILY 08/02/14  Yes Chipper Herb, MD  tamsulosin [facility]) 0.4 MG CAPS capsule TAKE (1) CAPSULE DAILY 09/20/14  Yes Chipper Herb, MD  ULORIC 40 MG tablet TAKE 1 TABLET DAILY 06/19/14  Yes Chipper Herb, MD  warfarin (COUMADIN) 2.5 MG tablet TAKE UP TO 3 TABLETS DAILY AS DIRECTED BY CPP 08/02/14  Yes Chipper Herb, MD  doxycycline (VIBRA-TABS) 100 MG tablet Take 1 tablet (100 mg total) by mouth 2 (two) times daily. With food Patient not taking: Reported on 10/03/2014 08/15/14   Chipper Herb, MD  meclizine (ANTIVERT) 32 MG tablet Take 1 tablet (32 mg total) by mouth 3 (three) times daily as needed. Patient not taking: Reported on 10/03/2014 05/22/14   Chipper Herb, MD  megestrol (MEGACE) 20 MG tablet TAKE 1 TABLET ONCE A DAY 03/20/14   Lysbeth Penner, FNP  mupirocin ointment (BACTROBAN) 2 % Apply small amt with qtip to wound after soaking TID Patient not taking: Reported on 10/03/2014 08/02/14   Adella Nissen, PA-C   Physical Exam: Filed Vitals:   10/03/14 1631 10/03/14 1632 10/03/14 1642 10/03/14 1645  BP:      Pulse:      Temp:      TempSrc:      Resp: 18 17 21 16   Height:      Weight:      SpO2:       temperature 96.6. Pulse 104. Respiratory rate 16. Blood pressure 107/83. Oxygen saturation 98% on room air.  Wt Readings from Last 3 Encounters:  10/03/14 72.576 kg (160 lb)  09/24/14 74.39 kg (164 lb)  08/17/14 75.297 kg (166 lb)    General:  Appears calm and comfortable; 81 year old elderly African-American man in no acute distress. Eyes: PERRL, normal lids, irises & conjunctiva; conjunctivae are clear and sclerae are white. ENT: grossly normal hearing; oropharynx reveals multiple missing teeth and poor dentition; mucous membranes dry. Neck: no LAD, masses or thyromegaly Cardiovascular: S1, S2, with occasional ectopy and tachycardia versus irregular, irregular, with rapid rate.. Telemetry: Sinus arrhythmia  versus atrial fibrillation with RVR.  Respiratory: Clear anteriorly with decreased breath sounds in the bases. Breathing is nonlabored. Abdomen: Positive  bowel sounds, soft, nontender, nondistended. Musculoskeletal/extremities: The left lower extremity is grossly edematous at 2-3+ with 3-4+ edema of the left foot; mild erythema from the ankle to the pretibial area on the left lower extremity; the left fifth toe is gangrenous and cool to touch and moderately tender; there is a deep fissure between the left fourth and fifth toes with small amount of seropurulent malodorous drainage; no palpable pedal pulse. Right lower extremity without edema and with palpable pulse.  Psychiatric: He is initially asleep, but becomes alert and oriented to himself. Flat affect. Neurologic: He is initially lethargic, but becomes alert and is oriented to himself, his niece, and hospital. He follows small commands. His speech is clear. Cranial nerves II through XII appear to be grossly intact.           Labs on Admission:  Basic Metabolic Panel:  Recent Labs Lab 10/03/14 1129  NA 134*  K 6.0*  CL 101  CO2 26  GLUCOSE 118*  BUN 74*  CREATININE 2.83*  CALCIUM 9.7   Liver Function Tests: No results for input(s): AST, ALT, ALKPHOS, BILITOT, PROT, ALBUMIN in the last 168 hours. No results for input(s): LIPASE, AMYLASE in the last 168 hours. No results for input(s): AMMONIA in the last 168 hours. CBC:  Recent Labs Lab 10/03/14 1129  WBC 7.6  NEUTROABS 6.1  HGB 12.2*  HCT 37.1*  MCV 83.4  PLT 262   Cardiac Enzymes: No results for input(s): CKTOTAL, CKMB, CKMBINDEX, TROPONINI in the last 168 hours.  BNP (last 3 results)  Recent Labs  03/26/14 1511  BNP 137.5*    ProBNP (last 3 results) No results for input(s): PROBNP in the last 8760 hours.  CBG: No results for input(s): GLUCAP in the last 168 hours.  Radiological Exams on Admission: Ct Head Wo Contrast  10/03/2014   CLINICAL DATA:   Altered mental status decline for 3 months.  EXAM: CT HEAD WITHOUT CONTRAST  TECHNIQUE: Contiguous axial images were obtained from the base of the skull through the vertex without intravenous contrast.  COMPARISON:  CT 07/10/2013  FINDINGS: No acute intracranial hemorrhage. No focal mass lesion. No CT evidence of acute infarction. No midline shift or mass effect. No hydrocephalus. Basilar cisterns are patent.  Remote infarction in the left centrum semiovale. There is generalized cortical atrophy. Partial ventricular dilatation. Mild periventricular white matter hypodensities.  13 mm round lesion in the sella which have has low-density is not changed from prior.  IMPRESSION: 1. No acute intracranial findings. 2. Chronic atrophy and white matter microvascular disease. 3. 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 4. Stable low-density cystic lesion in the sella   Electronically Signed   By: Suzy Bouchard M.D.   On: 10/03/2014 14:16   Dg Toe 5th Left  10/03/2014   CLINICAL DATA:  One month history of persistent skin ulceration. Pain and swelling.  EXAM: DG TOE 5TH LEFT  COMPARISON:  09/12/2014  FINDINGS: Knee PIP joint appears slightly narrowed compared to the prior study. There is air surrounding the joint and there is slight cortical irregularity along the medial aspect of the distal aspect of the proximal phalanx. Could not exclude septic arthritis and osteomyelitis. MRI is recommended for further evaluation.  IMPRESSION: Diffuse soft tissue swelling involving the fifth toe with air in the soft tissues.  Could not exclude septic arthritis and osteomyelitis centered at the PIP joint of the fifth toe. MRI without and with contrast is recommended for further evaluation.  Electronically Signed   By: Marijo Sanes M.D.   On: 10/03/2014 12:53    EKG: Independently reviewed. EKG #2 reveals atrial fibrillation with nonspecific IVCD and old inferior infarct; heart rate 131 bpm.  Assessment/Plan Principal  Problem:   Gangrene of toe Active Problems:   Sepsis due to other etiology   Hypothermia   Ischemic ulcer of left foot   Atherosclerotic PVD with ulceration   Cellulitis of left leg   Chronic systolic heart failure   NICM (nonischemic cardiomyopathy)- EF 20-25% by Echo 04/12/13   UTI (lower urinary tract infection)   Warfarin-induced coagulopathy   Atrial fibrillation with RVR   Acute kidney injury   Hyperkalemia   1. Gangrene of the left fifth toe, in the setting of atherosclerotic PVD with ulceration between the left fourth and fifth toes/ischemic ulcer, and mild cellulitis of the left leg. The patient was recently evaluated by vascular surgeon, Dr. Kellie Simmering on 09/24/14. He recommended to the patient and the patient's family a left above-the-knee amputation. He did not believe the patient was a candidate for open bypass procedure given his extensive occlusion of the superficial femoral popliteal and tibial vessels-angioplasty and stenting would not be successful, particularly in the setting of recent renal insufficiency. On admission, the x-ray of his foot revealed diffuse soft tissue swelling involving the fifth toe with air in the soft tissues, consistent with gangrene. Septic arthritis or osteomyelitis cannot be ruled out. The patient was started on vancomycin in the ED. Will add Zosyn for broader coverage. Will hold on ordering an MRI as I don't believe it will change the management. Blood cultures were ordered in the ED and will be followed. Ultimately, the patient needs an amputation as stated by Dr. Kellie Simmering. Apparently, he discussed this with the patient's son and daughter-in-law who is an Doctor, general practice in [place]. 2. Possible UTI. Urine culture ordered in the ED. Will continue antibiotic above. 3. Sepsis and mild lactic acidosis likely secondary to left fifth toe gangrene and UTI. The patient's lactic acid was initially 1.3, but increased to 2.3. Blood cultures and a urine culture ordered  and the results will be followed. We'll continue antibiotics as stated above. 4. Hypothermia, secondary to sepsis. His temperature on admission was 96.6. It has improved and normalized. Will treat supportively. 5. Acute  kidney injury. One year ago, the patient's creatinine was 1.1-1.5. I do not see a recent creatinine in the each chart. On admission, it is 2.83, likely prerenal or possibly ATN from the infection and hypovolemia and/or from chronic ARB treatment. Will hold Lasix, losartan, and spironolactone and will gently hydrate. We'll add a condom catheter for strict ins and outs. Will order renal ultrasound if his renal function does not improve with IV fluid hydration and holding nephrotoxins. 6. Hyperkalemia. Etiology likely secondary to outpatient potassium supplementation, spironolactone, and losartan. These are medications are being held. He was treated with an amp of bicarbonate, Kayexalate, and calcium carbonate in the ED. We'll continue treatment with a gentle bicarbonate drip. We'll recheck his potassium in several hours. 7. Warfarin-induced coagulopathy. The patient is treated with Coumadin chronically for his history of bilateral DVT and PE. His INR is 7.27. There appears to be no evidence of bleeding. We'll hold Coumadin. There is a low threshold for giving him vitamin K or fresh frozen plasma if there is any evidence of bleeding. 8. Atrial fibrillation with RVR. The patient has no history of atrial fibrillation, but he does have a history of nonsustained ventricular  tachycardia and PVCs. Will gently hydrate to see if it will decrease his heart rate. Will hold oral metoprolol and treat as needed with IV metoprolol.  9. Nonischemic cardiomyopathy/chronic systolic heart failure. 2-D echocardiogram 04/2013 revealed an ejection fraction of 20-25%. He is treated chronically with losartan, Lasix, spironolactone, and metoprolol. Will order a chest x-ray to evaluate for pulmonary edema, but clinically,  he appears to be volume depleted. His cardiac medications are being held for reasons as stated previously. Will hydrate with caution. We'll order another 2-D echocardiogram since it's been over one year since his last one.     Code Status: DO NOT RESUSCITATE per outpatient DO NOT RESUSCITATE form  DVT Prophylaxis: supratherapeutic INR  Family Communication: discussed with niece, [name] (apparent family spokesperson today).  Disposition Plan: to be determined.  Time spent: Critical care time: 1 hour and 30 minutes  [facility] Pager [phone]

## 2014-10-03 NOTE — ED Notes (Signed)
Per report JHHID, call came out for left foot pain that radiates up leg, per report pt was driving and walking a week ago but since yesterday pt not able to ambulate, per report pt able to answer questions

## 2014-10-03 NOTE — ED Provider Notes (Signed)
CSN: [id number]     Arrival date & time 10/03/14  1110 History  This chart was scribed for Virgel Manifold, MD by Eustaquio Maize, ED Scribe. This patient was seen in room APA10/APA10 and the patient's care was started at 11:36 AM.    Chief Complaint  Patient presents with  . Skin Ulcer   LEVEL 5 CAVEAT   The history is provided by the patient. No language interpreter was used.    HPI Comments: Scott [name] is a 81 y.o. male brought in by ambulance, with hx HTN, HLD, CHF, who presents to the Emergency Department complaining of skin ulcer. When asked why pt is in the ED today he cannot say why. Pt states he is feeling fine. He llives at home with wife and caregivers who are not present with pt in the ED. Pt believes he woke up this morning tired and confused. He states that these symptoms have since resolved. He mentions that he was having left foot and leg pain prior to arrival. Denies fever, nausea, vomiting, or any other symptoms.   Past Medical History  Diagnosis Date  . HTN (hypertension)   . Hyperlipidemia   . PVCs (premature ventricular contractions)   . Glaucoma   . PVD (peripheral vascular disease)     Right ABI .75, Left .78 (2006)  . Popliteal artery aneurysm, bilateral DECUMENTED CHRONIC PARTIAL OCCLUSION--  PT DENIES CLAUDICATION OR ANY OTHER SYMPTOMS  . Frequency of urination   . Nocturia   . Bladder cancer 03/15/2012  . Chronic systolic CHF (congestive heart failure)     a. NICM - patent cors 06/2012, EF 40% at that time. b. 03/2013 eval: EF 20-25%.  . DVT, bilateral lower limbs 03/2013    a. Bilat DVT dx 03/2013.  . Pulmonary embolism     a. By CT angio 04/2013.  Marland Kitchen Elevated troponin     a. Adm 12/30-04/2013 - not felt to represent ACS; ?due to PE. b. Patent cors 06/2012.  Marland Kitchen NSVT (nonsustained ventricular tachycardia)     a. NSVT 06/2012; NSVT also seen during 03/2013 adm. b. Med rx. Not candidate for ICD given adv age.  Marland Kitchen ETOH abuse   . Syncope     a. Felt to be postural  syncope related to diuretics 06/2012.  Marland Kitchen Heart murmur   . DVT (deep venous thrombosis)     "I think in both legs"  . Pneumonia     "couple times"  . DDD (degenerative disc disease)   . Arthritis     "all over"  . Chronic back pain   . Depression    Past Surgical History  Procedure Laterality Date  . Transthoracic echocardiogram  09-22-2010    MODERATE LVH/ EF 95%/ GRADE I DIASTOLIC DYSFUNCTION/ AORTIC SCLEROSIS WITHOUT STENOSIS/  RV  SYSTOLIC  MILDLY REDUCED FUNCTION  . Cardiovascular stress test  10-15-2010    LOW RISK NUCLEAR STUDY/ NO EVIDENCE OF ISCHEMIA/ NORMAL EF  . Shoulder surgery  1970's    "don't remember which side or what kind of OR"  . Transurethral resection of bladder tumor  10/07/2011    Procedure: TRANSURETHRAL RESECTION OF BLADDER TUMOR (TURBT);  Surgeon: Malka So, MD;  Location: [facility];  Service: Urology;  Laterality: N/A;  . Cystoscopy  10/07/2011    Procedure: CYSTOSCOPY;  Surgeon: Malka So, MD;  Location: [facility];  Service: Urology;  Laterality: N/A;  . Cystoscopy with biopsy  03/14/2012    Procedure:  CYSTOSCOPY WITH BIOPSY;  Surgeon: Malka So, MD;  Location: [facility];  Service: Urology;  Laterality: N/A;  WITH FULGURATION  . Transurethral resection of prostate  03/14/2012    Procedure: TRANSURETHRAL RESECTION OF THE PROSTATE WITH GYRUS INSTRUMENTS;  Surgeon: Malka So, MD;  Location: [facility];  Service: Urology;  Laterality: N/A;  . Tonsillectomy    . Cataract extraction w/ intraocular lens  implant, bilateral Bilateral   . Cardiac catheterization  05-11-2004  DR Prosser PLAQUE/ NORMAL LVF/ EF 55%/  NON-OBSTRUCTIVE LAD 25%  . Cardiac catheterization  06/2012  . Eye surgery     Family History  Problem Relation Age of Onset  . Hypertension Mother   . Arthritis Mother   . Lung cancer Sister   . Deep vein thrombosis Father   . Early death Brother    History  Substance Use Topics  . Smoking  status: Former Smoker -- 1.00 packs/day for 20 years    Types: Cigarettes    Start date: 04/12/1942    Quit date: 04/13/1975  . Smokeless tobacco: Never Used  . Alcohol Use: No     Comment: 10/09/2013 "quit drinking in early June 2015"    Review of Systems  Unable to perform ROS Constitutional: Negative for fever.  Gastrointestinal: Negative for nausea and vomiting.  Musculoskeletal: Positive for arthralgias (Left foot and left leg pain. ).      Allergies  Lipitor  Home Medications   Prior to Admission medications   Medication Sig Start Date End Date Taking? Authorizing Provider  donepezil (ARICEPT) 10 MG tablet TAKE ONE TABLET AT BEDTIME 07/10/14  Yes Chipper Herb, MD  furosemide (LASIX) 40 MG tablet 40 MG ON MON, WED, AND FRI'S; THEN 20 MG ALL OTHER DAYS 10/18/13  Yes Liliane Shi, PA-C  gabapentin (NEURONTIN) 100 MG capsule TAKE (2) CAPSULES TWICE DAILY. 07/10/14  Yes Chipper Herb, MD  HYDROcodone-acetaminophen (NORCO/VICODIN) 5-325 MG per tablet Take 1 tablet by mouth every 6 (six) hours as needed for moderate pain or severe pain. 09/30/14  Yes Chipper Herb, MD  isosorbide mononitrate (IMDUR) 30 MG 24 hr tablet Take 0.5 tablets (15 mg total) by mouth daily. 10/18/13  Yes Scott T Kathlen Mody, PA-C  losartan (COZAAR) 25 MG tablet Take 1 tablet (25 mg total) by mouth daily. 11/27/13  Yes Minus Breeding, MD  metoprolol succinate (TOPROL-XL) 50 MG 24 hr tablet TAKE (1) TABLET TWICE A DAY. 09/26/14  Yes Minus Breeding, MD  potassium chloride SA (K-DUR,KLOR-CON) 20 MEQ tablet Take 1 tablet (20 mEq total) by mouth daily. 09/04/14  Yes Chipper Herb, MD  simvastatin (ZOCOR) 10 MG tablet TAKE 1 TABLET DAILY 07/25/14  Yes Chipper Herb, MD  spironolactone (ALDACTONE) 25 MG tablet TAKE 1 TABLET DAILY 08/02/14  Yes Chipper Herb, MD  tamsulosin [facility]) 0.4 MG CAPS capsule TAKE (1) CAPSULE DAILY 09/20/14  Yes Chipper Herb, MD  ULORIC 40 MG tablet TAKE 1 TABLET DAILY 06/19/14  Yes Chipper Herb,  MD  doxycycline (VIBRA-TABS) 100 MG tablet Take 1 tablet (100 mg total) by mouth 2 (two) times daily. With food 08/15/14   Chipper Herb, MD  fluticasone [facility]) 50 MCG/ACT nasal spray Place 2 sprays into both nostrils daily. 03/26/14   Chipper Herb, MD  lidocaine (XYLOCAINE) 2 % jelly APPLY AS NEEDED TO THE LEFT FOOD ULCER 09/27/14   Historical Provider, MD  meclizine (ANTIVERT) 32 MG tablet Take 1  tablet (32 mg total) by mouth 3 (three) times daily as needed. 05/22/14   Chipper Herb, MD  megestrol (MEGACE) 20 MG tablet TAKE 1 TABLET ONCE A DAY 03/20/14   Lysbeth Penner, FNP  mupirocin ointment (BACTROBAN) 2 % Apply small amt with qtip to wound after soaking TID 08/02/14   Tiffany A Gann, PA-C  nitroGLYCERIN (NITROSTAT) 0.4 MG SL tablet Place 1 tablet (0.4 mg total) under the tongue every 5 (five) minutes as needed for chest pain (up to 3 doses). 04/19/13   Dayna N Dunn, PA-C  warfarin (COUMADIN) 2.5 MG tablet TAKE UP TO 3 TABLETS DAILY AS DIRECTED BY CPP 08/02/14   Chipper Herb, MD   Triage Vitals: BP 118/73 mmHg  Pulse 84  Temp(Src) 97.5 F (36.4 C) (Oral)  Resp 16  Ht 5\' 7"  (1.702 m)  Wt 160 lb (72.576 kg)  BMI 25.05 kg/m2  SpO2 97%   Physical Exam  Constitutional: He is oriented to person, place, and time. He appears well-developed and well-nourished. No distress.  Drowsy. Answers question but seems to lose train of thought easily. Sometimes falls asleep mid sentence.   HENT:  Head: Normocephalic and atraumatic.  Eyes: Conjunctivae and EOM are normal.  Neck: Neck supple. No tracheal deviation present.  Cardiovascular: Normal rate.   Pulmonary/Chest: Effort normal. No respiratory distress.  Musculoskeletal: Normal range of motion.  Neurological: He is alert and oriented to person, place, and time.  Skin: Skin is warm and dry.  Very deep fissure in the web space between left 4th and 5th toes; Foul smelling. 5th toe is dark and cool to the touch. Diffuse foot and ankle swelling.  Increased warmth and compared to the right foot.   Psychiatric: He has a normal mood and affect. His behavior is normal.  Nursing note and vitals reviewed.   ED Course  Procedures (including critical care time)  DIAGNOSTIC STUDIES: Oxygen Saturation is 97% on RA, normal by my interpretation.    COORDINATION OF CARE: 11:48 AM-Discussed treatment plan which includes CBC and BMP with pt at bedside and pt agreed to plan.   Labs Review Labs Reviewed  MRSA PCR SCREENING - Abnormal; Notable for the following:    MRSA by PCR POSITIVE (*)    All other components within normal limits  CBC WITH DIFFERENTIAL/PLATELET - Abnormal; Notable for the following:    Hemoglobin 12.2 (*)    HCT 37.1 (*)    Neutrophils Relative % 79 (*)    Lymphocytes Relative 9 (*)    All other components within normal limits  BASIC METABOLIC PANEL - Abnormal; Notable for the following:    Sodium 134 (*)    Potassium 6.0 (*)    Glucose, Bld 118 (*)    BUN 74 (*)    Creatinine, Ser 2.83 (*)    GFR calc non Af Amer 18 (*)    GFR calc Af Amer 21 (*)    All other components within normal limits  LACTIC ACID, PLASMA - Abnormal; Notable for the following:    Lactic Acid, Venous 2.3 (*)    All other components within normal limits  URINALYSIS, ROUTINE W REFLEX MICROSCOPIC (NOT AT [facility] - Abnormal; Notable for the following:    Hgb urine dipstick TRACE (*)    All other components within normal limits  URINE MICROSCOPIC-ADD ON - Abnormal; Notable for the following:    Bacteria, UA MANY (*)    All other components within normal limits  PROTIME-INR -  Abnormal; Notable for the following:    Prothrombin Time 59.6 (*)    INR 7.27 (*)    All other components within normal limits  PROTIME-INR - Abnormal; Notable for the following:    Prothrombin Time 71.6 (*)    INR 9.29 (*)    All other components within normal limits  COMPREHENSIVE METABOLIC PANEL - Abnormal; Notable for the following:    Glucose, Bld 101 (*)    BUN  54 (*)    Creatinine, Ser 1.74 (*)    Albumin 3.3 (*)    GFR calc non Af Amer 33 (*)    GFR calc Af Amer 38 (*)    All other components within normal limits  BASIC METABOLIC PANEL - Abnormal; Notable for the following:    Potassium 5.6 (*)    Glucose, Bld 105 (*)    BUN 65 (*)    Creatinine, Ser 2.17 (*)    GFR calc non Af Amer 25 (*)    GFR calc Af Amer 29 (*)    All other components within normal limits  LACTIC ACID, PLASMA - Abnormal; Notable for the following:    Lactic Acid, Venous 3.2 (*)    All other components within normal limits  LACTIC ACID, PLASMA - Abnormal; Notable for the following:    Lactic Acid, Venous 2.4 (*)    All other components within normal limits  PROTIME-INR - Abnormal; Notable for the following:    Prothrombin Time 36.0 (*)    INR 3.72 (*)    All other components within normal limits  BASIC METABOLIC PANEL - Abnormal; Notable for the following:    BUN 29 (*)    Calcium 8.7 (*)    GFR calc non Af Amer 53 (*)    All other components within normal limits  CBC - Abnormal; Notable for the following:    RBC 3.76 (*)    Hemoglobin 10.3 (*)    HCT 31.5 (*)    All other components within normal limits  PROTIME-INR - Abnormal; Notable for the following:    Prothrombin Time 23.9 (*)    INR 2.16 (*)    All other components within normal limits  BASIC METABOLIC PANEL - Abnormal; Notable for the following:    BUN 21 (*)    Calcium 8.7 (*)    GFR calc non Af Amer 57 (*)    All other components within normal limits  CBC - Abnormal; Notable for the following:    RBC 3.54 (*)    Hemoglobin 9.7 (*)    HCT 30.0 (*)    All other components within normal limits  CULTURE, BLOOD (ROUTINE X 2)  CULTURE, BLOOD (ROUTINE X 2)  URINE CULTURE  LACTIC ACID, PLASMA  TSH  MAGNESIUM  VITAMIN B12    Imaging Review Dg Chest Port 1 View  10/05/2014   CLINICAL DATA:  Shortness of breath, cough, congestion, wheezing.  EXAM: PORTABLE CHEST - 1 VIEW  COMPARISON:   10/03/2014; 03/26/2014; 03/14/2013 ; chest CT- 04/10/2013  FINDINGS: Grossly unchanged enlarged cardiac silhouette and mediastinal contours given decreased lung volumes and patient rotation. Atherosclerotic plaque within thoracic aorta. There is persistent thickening of the right paratracheal stripe secondary to prominent vasculature as demonstrated on prior chest CT performed 03/2013. Pulmonary venous congestion without frank evidence of edema. Worsening bibasilar and right perihilar opacities, right greater than left. No definite pleural effusion or pneumothorax. Unchanged bones.  IMPRESSION: Worsening right perihilar and bibasilar opacities, right greater than left,  possibly atelectasis fell aspiration and/or infection could have a similar appearance. Further evaluation with a PA and lateral chest radiograph may be obtained as clinically indicated.   Electronically Signed   By: Sandi Mariscal M.D.   On: 10/05/2014 08:59     EKG Interpretation   Date/Time:  Thursday October 03 2014 16:39:17 EDT Ventricular Rate:  131 PR Interval:  339 QRS Duration: 130 QT Interval:  307 QTC Calculation: 453 R Axis:   -76 Text Interpretation:  Atrial fibrillation Nonspecific IVCD with LAD  Inferior infarct, old Probable anterior infarct, age indeterminate ED  PHYSICIAN INTERPRETATION AVAILABLE IN [facility] Confirmed by TEST,  Record ([id number]) on 10/04/2014 7:40:31 AM      MDM   Final diagnoses:  AKI (acute kidney injury)  Cellulitis of left foot  Altered mental status, unspecified altered mental status type  Hyperkalemia    89yM with change in mental status. Likely multifactorial. Chronic wound L foot but seems may now have superimposed cellulitis. Possible pneumonia. AKI. Supratherapeutic INR but without clinical evidence of acute bleed. Hyperkalemic without acute EKG changes. Admit for further eval.      Virgel Manifold, MD 10/06/14 1550

## 2014-10-03 NOTE — ED Notes (Addendum)
Pts son called and stated that he was a doctor and [place], Gearld Kerstein [phone]. He states that his father has been declining x 3 mo. Worse past 3 weeks, and even worse x 3 days after being placed on Vicodin, stating pt became even more debilitated  And unable to feed himself. He states he and his wife, who is a Psychologist, sport and exercise and the pts doctor, is concerned that he may be having a negative reaction to vicodin. Also states concern for one reaction to antibiotic that may be causing his symptoms since he has been on 3 different antibiotics for his toe recently. States he is on coumadin and there may be a bleeding concern, electrolyte problem and concern for any stroke-like symptoms. Informed son that this information would be placed in the chart. Daughter-in-law, Dr. Jamie Kato [phone]; [phone]. The son then stated, "It may be best to call my wife if any major concerns; she is a physician and I am just a doctor (PhD)"

## 2014-10-03 NOTE — ED Notes (Signed)
MD at bedside. Dr. Caryn Section at bedside

## 2014-10-03 NOTE — Consult Note (Signed)
WOC wound consult note Reason for Consult: Worsening ulcers of the left foot, 4th and 5th digits that are suspicious for gangrene.  Patient with multiple comorbid conditions and suggestions for wound care are beyond the scope of [place] practice except to implement conservative measures consisting of application and changing of dry dressings and protection of the foot with a pressure redistribution boot [facility] Kellie Simmering [phone]). If you agree, please order. I spoke with bedside RN this evening and he will contact Dr. Caryn Section to see if a vascular or other surgical consultation (eg. Orthopedic or General) is appropriate, even if it is to discuss the options for these affected toes with family. It is noted that the patient's daughter-in-law is a Psychologist, sport and exercise and is also the patient's doctor. The patient's son is also a physician. Patient is not seen today by this Probation officer. [place] nursing team will not follow, but will remain available to this patient, the nursing and medical teams.  Please re-consult if needed. Thanks, Maudie Flakes, MSN, RN, [place], [place], [place] [phone])

## 2014-10-03 NOTE — ED Notes (Signed)
CRITICAL VALUE ALERT  Critical value received:  Lactic Acid 2.3  Date of notification: 10/03/2014  Time of notification:  5271  Critical value read back:yes  Nurse who received alert: Di Kindle  MD notified (1st page):  Dr. Lacinda Axon  Time of first page: 1548  MD notified (2nd page):  Time of second page:  Responding MD:  Dr. Lacinda Axon  Time MD responded: [phone]

## 2014-10-03 NOTE — ED Notes (Signed)
Dr. Caryn Section paged and made aware of pt's last EKG -Afib

## 2014-10-03 NOTE — Progress Notes (Signed)
Patient has a wedding ring and a cell phone with charger in top drawer of dresser beside bed. He has a second working cell phone on top of [employer]. Patient is also wearing gold colored necklace. Also has medications in med room. a

## 2014-10-03 NOTE — ED Notes (Signed)
CRITICAL VALUE ALERT  Critical value received:  INR 7.27  Date of notification:  10/03/14  Time of notification:  3893  Critical value read back:Yes.    Nurse who received alert:  B. Olena Heckle, RN  MD notified (1st page):  Wilson Singer  Time of first page:  1447  MD notified (2nd page):  Time of second page:  Responding MD:  Wilson Singer  Time MD responded:  [phone]

## 2014-10-03 NOTE — Progress Notes (Signed)
[facility] notiified of lactic acid results, new orders received.

## 2014-10-03 NOTE — Progress Notes (Signed)
ANTIBIOTIC CONSULT NOTE - INITIAL  Pharmacy Consult for Zosyn Indication: Wound infection  Allergies  Allergen Reactions  . Lipitor [Atorvastatin] Other (See Comments)    unknown    Patient Measurements: Height: 5\' 7"  (170.2 cm) Weight: 160 lb (72.576 kg) IBW/kg (Calculated) : 66.1 Adjusted Body Weight:   Vital Signs: Temp: 96.6 F (35.9 C) (06/23 1522) Temp Source: Rectal (06/23 1522) BP: 107/83 mmHg (06/23 1630) Pulse Rate: 104 (06/23 1615) Intake/Output from previous day:   Intake/Output from this shift:    Labs:  Recent Labs  10/03/14 1129  WBC 7.6  HGB 12.2*  PLT 262  CREATININE 2.83*   Estimated Creatinine Clearance: 15.9 mL/min (by C-G formula based on Cr of 2.83). No results for input(s): VANCOTROUGH, VANCOPEAK, VANCORANDOM, GENTTROUGH, GENTPEAK, GENTRANDOM, TOBRATROUGH, TOBRAPEAK, TOBRARND, AMIKACINPEAK, AMIKACINTROU, AMIKACIN in the last 72 hours.   Microbiology: Recent Results (from the past 720 hour(s))  Blood culture (routine x 2)     Status: None (Preliminary result)   Collection Time: 10/03/14 11:30 AM  Result Value Ref Range Status   Specimen Description BLOOD RIGHT ANTECUBITAL  Final   Special Requests BOTTLES DRAWN AEROBIC AND ANAEROBIC [facility] EACH  Final   Culture PENDING  Incomplete   Report Status PENDING  Incomplete  Blood culture (routine x 2)     Status: None (Preliminary result)   Collection Time: 10/03/14 12:10 PM  Result Value Ref Range Status   Specimen Description BLOOD LEFT ANTECUBITAL  Final   Special Requests BOTTLES DRAWN AEROBIC ONLY 8CC  Final   Culture PENDING  Incomplete   Report Status PENDING  Incomplete    Medical History: Past Medical History  Diagnosis Date  . HTN (hypertension)   . Hyperlipidemia   . PVCs (premature ventricular contractions)   . Glaucoma   . PVD (peripheral vascular disease)     Right ABI .75, Left .78 (2006)  . Popliteal artery aneurysm, bilateral DECUMENTED CHRONIC PARTIAL OCCLUSION--  PT  DENIES CLAUDICATION OR ANY OTHER SYMPTOMS  . Frequency of urination   . Nocturia   . Bladder cancer 03/15/2012  . Chronic systolic CHF (congestive heart failure)     a. NICM - patent cors 06/2012, EF 40% at that time. b. 03/2013 eval: EF 20-25%.  . DVT, bilateral lower limbs 03/2013    a. Bilat DVT dx 03/2013.  . Pulmonary embolism     a. By CT angio 04/2013.  Marland Kitchen Elevated troponin     a. Adm 12/30-04/2013 - not felt to represent ACS; ?due to PE. b. Patent cors 06/2012.  Marland Kitchen NSVT (nonsustained ventricular tachycardia)     a. NSVT 06/2012; NSVT also seen during 03/2013 adm. b. Med rx. Not candidate for ICD given adv age.  Marland Kitchen ETOH abuse   . Syncope     a. Felt to be postural syncope related to diuretics 06/2012.  Marland Kitchen Heart murmur   . DVT (deep venous thrombosis)     "I think in both legs"  . Pneumonia     "couple times"  . DDD (degenerative disc disease)   . Arthritis     "all over"  . Chronic back pain   . Depression   . CAD- non obstructive disease by cath 3/14   . PAD (peripheral artery disease) 09/24/2014    Medications:   (Not in a hospital admission) Assessment: 81 yo male, ED patient to be admitted  [place] consult wound infection CrCl < 20 ml/min  Goal of Therapy:  Eradicate infection  Plan:  Zosyn 2.25 GM IV every 8 hours Monitor renal function Labs per protocol  Abner Greenspan, Crytal Pensinger Bennett 10/03/2014,4:51 PM

## 2014-10-04 ENCOUNTER — Inpatient Hospital Stay (HOSPITAL_COMMUNITY): Payer: Medicare Other

## 2014-10-04 DIAGNOSIS — I509 Heart failure, unspecified: Secondary | ICD-10-CM

## 2014-10-04 DIAGNOSIS — I447 Left bundle-branch block, unspecified: Secondary | ICD-10-CM

## 2014-10-04 DIAGNOSIS — I255 Ischemic cardiomyopathy: Secondary | ICD-10-CM

## 2014-10-04 DIAGNOSIS — I472 Ventricular tachycardia: Secondary | ICD-10-CM

## 2014-10-04 DIAGNOSIS — R000 Tachycardia, unspecified: Secondary | ICD-10-CM

## 2014-10-04 LAB — COMPREHENSIVE METABOLIC PANEL
ALT: 26 U/L (ref 17–63)
AST: 32 U/L (ref 15–41)
Albumin: 3.3 g/dL — ABNORMAL LOW (ref 3.5–5.0)
Alkaline Phosphatase: 112 U/L (ref 38–126)
Anion gap: 10 (ref 5–15)
BUN: 54 mg/dL — ABNORMAL HIGH (ref 6–20)
CO2: 23 mmol/L (ref 22–32)
Calcium: 8.9 mg/dL (ref 8.9–10.3)
Chloride: 106 mmol/L (ref 101–111)
Creatinine, Ser: 1.74 mg/dL — ABNORMAL HIGH (ref 0.61–1.24)
GFR calc Af Amer: 38 mL/min — ABNORMAL LOW (ref >60)
GFR calc non Af Amer: 33 mL/min — ABNORMAL LOW (ref >60)
Glucose, Bld: 101 mg/dL — ABNORMAL HIGH (ref 65–99)
Potassium: 4.9 mmol/L (ref 3.5–5.1)
Sodium: 139 mmol/L (ref 135–145)
Total Bilirubin: 0.7 mg/dL (ref 0.3–1.2)
Total Protein: 6.7 g/dL (ref 6.5–8.1)

## 2014-10-04 LAB — PROTIME-INR
INR: 9.29 (ref 0.00–1.49)
Prothrombin Time: 71.6 seconds — ABNORMAL HIGH (ref 11.6–15.2)

## 2014-10-04 LAB — MAGNESIUM: Magnesium: 1.9 mg/dL (ref 1.7–2.4)

## 2014-10-04 MED ORDER — ADULT MULTIVITAMIN W/MINERALS CH
1.0000 | ORAL_TABLET | Freq: Every day | ORAL | Status: DC
  Administered 2014-10-04 – 2014-10-08 (×5): 1 via ORAL
  Filled 2014-10-04 (×5): qty 1

## 2014-10-04 MED ORDER — VITAMIN B-1 100 MG PO TABS
100.0000 mg | ORAL_TABLET | Freq: Every day | ORAL | Status: DC
  Administered 2014-10-04 – 2014-10-06 (×3): 100 mg via ORAL
  Filled 2014-10-04 (×3): qty 1

## 2014-10-04 MED ORDER — VANCOMYCIN HCL IN DEXTROSE 750-5 MG/150ML-% IV SOLN
750.0000 mg | INTRAVENOUS | Status: DC
  Administered 2014-10-04: 750 mg via INTRAVENOUS
  Filled 2014-10-04 (×3): qty 150

## 2014-10-04 MED ORDER — SODIUM CHLORIDE 0.9 % IV SOLN
INTRAVENOUS | Status: DC
  Administered 2014-10-04 – 2014-10-06 (×3): via INTRAVENOUS

## 2014-10-04 MED ORDER — LORAZEPAM 2 MG/ML IJ SOLN
0.5000 mg | INTRAMUSCULAR | Status: DC | PRN
  Administered 2014-10-04 – 2014-10-07 (×6): 0.5 mg via INTRAVENOUS
  Filled 2014-10-04 (×6): qty 1

## 2014-10-04 MED ORDER — LORAZEPAM 0.5 MG PO TABS: 1.0000 mg | ORAL_TABLET | Freq: Four times a day (QID) | ORAL | Status: DC | PRN

## 2014-10-04 MED ORDER — VITAMIN K1 10 MG/ML IJ SOLN
3.0000 mg | Freq: Once | INTRAMUSCULAR | Status: AC
  Administered 2014-10-04: 3 mg via SUBCUTANEOUS
  Filled 2014-10-04: qty 1

## 2014-10-04 MED ORDER — PIPERACILLIN-TAZOBACTAM 3.375 G IVPB
3.3750 g | Freq: Three times a day (TID) | INTRAVENOUS | Status: DC
  Administered 2014-10-04 – 2014-10-07 (×10): 3.375 g via INTRAVENOUS
  Filled 2014-10-04 (×13): qty 50

## 2014-10-04 MED ORDER — LORAZEPAM 2 MG/ML IJ SOLN: 1.0000 mg | Freq: Four times a day (QID) | INTRAMUSCULAR | Status: DC | PRN

## 2014-10-04 MED ORDER — VITAMIN K1 10 MG/ML IJ SOLN
2.5000 mg | Freq: Once | INTRAMUSCULAR | Status: DC
  Filled 2014-10-04: qty 1.5

## 2014-10-04 MED ORDER — THIAMINE HCL 100 MG/ML IJ SOLN
100.0000 mg | Freq: Every day | INTRAMUSCULAR | Status: DC
  Administered 2014-10-07 – 2014-10-08 (×2): 100 mg via INTRAVENOUS
  Filled 2014-10-04 (×3): qty 2

## 2014-10-04 MED ORDER — METOPROLOL SUCCINATE ER 25 MG PO TB24
25.0000 mg | ORAL_TABLET | Freq: Two times a day (BID) | ORAL | Status: DC
  Administered 2014-10-04 – 2014-10-06 (×6): 25 mg via ORAL
  Filled 2014-10-04 (×6): qty 1

## 2014-10-04 MED ORDER — FOLIC ACID 1 MG PO TABS
1.0000 mg | ORAL_TABLET | Freq: Every day | ORAL | Status: DC
  Administered 2014-10-04 – 2014-10-08 (×5): 1 mg via ORAL
  Filled 2014-10-04 (×5): qty 1

## 2014-10-04 NOTE — Care Management Note (Signed)
Case Management Note  Patient Details  Name: Scott [name] MRN: [id number] Date of Birth: August 01, 1921  Expected Discharge Date:  10/06/14               Expected Discharge Plan:  [place]  In-House Referral:  NA  Discharge planning Services  CM Consult  Post Acute Care Choice:  NA Choice offered to:  Patient  DME Arranged:    DME Agency:     HH Arranged:  RN, PT, Nurse's Aide [place] Agency:  [place]  Status of Service:  In process, will continue to follow  Medicare Important Message Given:  Yes Date Medicare IM Given:  10/04/14 Medicare IM give by:  Jolene Provost, RN, MSN, CM  Date Additional Medicare IM Given:    Additional Medicare Important Message give by:     If discussed at [place] of Stay Meetings, dates discussed:    Additional Comments: Pt is from home, lives with wife who he provides care for. Pt says he normally ambulates with cane but has a walker and wheelchair if needed. Pt's wife has [place] through [facility] and patient would like the same. Pt desires to return home, with his wife, with [facility] services. Anticipate pt will need RN for wound care and ? IV abx. Pt will need PT consult prior to discharge. Discharge not anticipated over the weekend. [facility] made aware of referral and will obtain pt info from chart. CM will cont to follow for needs.  Sherald Barge, RN 10/04/2014, 11:34 AM

## 2014-10-04 NOTE — Consult Note (Signed)
CARDIOLOGY CONSULT NOTE   Patient ID: KAMERIN GRUMBINE MRN: [id number] DOB/AGE: 1921-08-23 81 y.o.  Admit Date: 10/03/2014 Referring Physician: PTH-Fisher MD Primary Physician: Redge Gainer, MD Consulting Cardiologist: Glori Bickers  Primary Cardiologist: Minus Breeding MD/Allred, [name] MD Reason for Consultation: Wide Complex Tachycardia and atrial fib  Clinical Summary Mr. Boback is a 81 y.o.male with known HX of LBBB, ICM, Hypertension, PAF, NSVT, Bilateral DVT on coumadin therapy, PVD, who was admitted with sepsis, ETOH abuse admitted with generalized weakness and fatigue, gangrene of his left forth and fifth toes. He was found to be in early sepsis, hypothermic and in atrial fib. He has since had episodes of SVT with baseline LBBB and with frequent PVC's. We are asked to make recommendations for medical management.   On arrival to ER he was found to have K+ of 5.6, INR of 7.27, Creatinine of 2.17. Lactic Acid of 2.3. BP 133/73 HR of 88 bpm. He was confused and uncooperative. He was treated with IV fluids, sodium bicarb, vancomycin and Kayexulate. At home hei s on spironolactone 25 mg daily, metoprolol 50 mg daily, isosorbide 30 mg, warfarin 2.5 mg, lasix  40 mg daily alternating with 20 mg daily and losartan. These have been held with the exception of isosorbide, and metoprolol which is now at lower dose of 25 mg daily. BP and Heart rate are well controlled currently. Potassium is improved.   He remains confused and unable to given history. Family at bedside who are sisters cannot provide much information. Hx is obtained from current and PMH. I have spoken with the caregiver who states he has been drinking "white moonshine" almost daily. She caught him drinking it and he spilled it. There is some reported medical non-compliance.    Allergies  Allergen Reactions  . Lipitor [Atorvastatin] Other (See Comments)    unknown    Medications Scheduled Medications: . Chlorhexidine  Gluconate Cloth  6 each Topical Q0600  . donepezil  10 mg Oral QHS  . febuxostat  40 mg Oral Daily  . fluticasone  2 spray Each Nare Daily  . gabapentin  200 mg Oral BID  . isosorbide mononitrate  15 mg Oral Daily  . metoprolol succinate  25 mg Oral BID  . mupirocin ointment  1 application Nasal BID  . piperacillin-tazobactam (ZOSYN)  IV  3.375 g Intravenous Q8H  . simvastatin  10 mg Oral Daily  . tamsulosin  0.4 mg Oral QPC breakfast  . vancomycin  750 mg Intravenous Q24H    Infusions: . sodium chloride 70 mL/hr at 10/04/14 0915    PRN Medications: acetaminophen **OR** acetaminophen, alum & mag hydroxide-simeth, guaiFENesin-dextromethorphan, levalbuterol, metoprolol, morphine injection, nitroGLYCERIN, ondansetron **OR** ondansetron (ZOFRAN) IV, oxyCODONE   Past Medical History  Diagnosis Date  . HTN (hypertension)   . Hyperlipidemia   . PVCs (premature ventricular contractions)   . Glaucoma   . PVD (peripheral vascular disease)     Right ABI .75, Left .78 (2006)  . Popliteal artery aneurysm, bilateral DECUMENTED CHRONIC PARTIAL OCCLUSION--  PT DENIES CLAUDICATION OR ANY OTHER SYMPTOMS  . Frequency of urination   . Nocturia   . Bladder cancer 03/15/2012  . Chronic systolic CHF (congestive heart failure)     a. NICM - patent cors 06/2012, EF 40% at that time. b. 03/2013 eval: EF 20-25%.  . DVT, bilateral lower limbs 03/2013    a. Bilat DVT dx 03/2013.  . Pulmonary embolism     a. By CT angio 04/2013.  Marland Kitchen  Elevated troponin     a. Adm 12/30-04/2013 - not felt to represent ACS; ?due to PE. b. Patent cors 06/2012.  Marland Kitchen NSVT (nonsustained ventricular tachycardia)     a. NSVT 06/2012; NSVT also seen during 03/2013 adm. b. Med rx. Not candidate for ICD given adv age.  Marland Kitchen ETOH abuse   . Syncope     a. Felt to be postural syncope related to diuretics 06/2012.  Marland Kitchen Heart murmur   . DVT (deep venous thrombosis)     "I think in both legs"  . Pneumonia     "couple times"  . DDD (degenerative  disc disease)   . Arthritis     "all over"  . Chronic back pain   . Depression   . CAD- non obstructive disease by cath 3/14   . PAD (peripheral artery disease)     Past Surgical History  Procedure Laterality Date  . Transthoracic echocardiogram  09-22-2010    MODERATE LVH/ EF 18%/ GRADE I DIASTOLIC DYSFUNCTION/ AORTIC SCLEROSIS WITHOUT STENOSIS/  RV  SYSTOLIC  MILDLY REDUCED FUNCTION  . Cardiovascular stress test  10-15-2010    LOW RISK NUCLEAR STUDY/ NO EVIDENCE OF ISCHEMIA/ NORMAL EF  . Shoulder surgery  1970's    "don't remember which side or what kind of OR"  . Transurethral resection of bladder tumor  10/07/2011    Procedure: TRANSURETHRAL RESECTION OF BLADDER TUMOR (TURBT);  Surgeon: Malka So, MD;  Location: [facility];  Service: Urology;  Laterality: N/A;  . Cystoscopy  10/07/2011    Procedure: CYSTOSCOPY;  Surgeon: Malka So, MD;  Location[facility];  Service: Urology;  Laterality: N/A;  . Cystoscopy with biopsy  03/14/2012    Procedure: CYSTOSCOPY WITH BIOPSY;  Surgeon: Malka So, MD;  Location: [facility];  Service: Urology;  Laterality: N/A;  WITH FULGURATION  . Transurethral resection of prostate  03/14/2012    Procedure: TRANSURETHRAL RESECTION OF THE PROSTATE WITH GYRUS INSTRUMENTS;  Surgeon: Malka So, MD;  Location: [facility];  Service: Urology;  Laterality: N/A;  . Tonsillectomy    . Cataract extraction w/ intraocular lens  implant, bilateral Bilateral   . Cardiac catheterization  05-11-2004  DR [place] PLAQUE/ NORMAL LVF/ EF 55%/  NON-OBSTRUCTIVE LAD 25%  . Cardiac catheterization  06/2012  . Eye surgery      Family History  Problem Relation Age of Onset  . Hypertension Mother   . Arthritis Mother   . Lung cancer Sister   . Deep vein thrombosis Father   . Early death Brother     Social History Mr. Emile reports that he quit smoking about 79 years ago. His smoking use included Cigarettes. He started  smoking about 72 years ago. He has a 20 pack-year smoking history. He has never used smokeless tobacco. Mr. Hustead reports that he does not drink alcohol.  Review of Systems Complete review of systems are found to be negative unless outlined in H&P above.  Physical Examination Blood pressure 138/77, pulse 27, temperature 98.5 F (36.9 C), temperature source Oral, resp. rate 18, height 5\' 10"  (1.778 m), weight 154 lb 12.2 oz (70.2 kg), SpO2 73 %.  Intake/Output Summary (Last 24 hours) at 10/04/14 1701 Last data filed at 10/04/14 1700  Gross per 24 hour  Intake 2321.5 ml  Output    675 ml  Net 1646.5 ml    Telemetry: LBBB with frequent PVC's and SVT   GEN:  Confused HEENT: Conjunctiva and lids normal, oropharynx clear with moist mucosa. Neck: Supple, no elevated JVP or carotid bruits, no thyromegaly. Lungs: Bilateral crackles without coughing.  Cardiac: Irregular rate and rhythm, 1/6 systolic murmur, no pericardial rub. Abdomen: Soft, nontender, no hepatomegaly, bowel sounds present, no guarding or rebound. Extremities: No pitting edema, distal pulses 2+. Left foot malodorous, with blackened toes and bogging flesh.  Skin: Warm and dry. Musculoskeletal: No kyphosis. Neuropsychiatric: Alert and oriented x3, affect grossly appropriate.  Prior Cardiac Testing/Procedures Echocardiogram: 10/04/2014 Left ventricle: Abnormal septal motion The cavity size was normal. There was mild concentric hypertrophy. Systolic function was normal. The estimated ejection fraction was in the range of 50% to 55%. Doppler parameters are consistent with abnormal left ventricular relaxation (grade 1 diastolic dysfunction). - Aortic valve: There was trivial regurgitation. - Mitral valve: Calcified annulus.  Lab Results  Basic Metabolic Panel:  Recent Labs Lab 10/03/14 1129 10/03/14 1808 10/04/14 0450  NA 134* 140 139  K 6.0* 5.6* 4.9  CL 101 106 106  CO2 26 27 23   GLUCOSE 118* 105* 101*    BUN 74* 65* 54*  CREATININE 2.83* 2.17* 1.74*  CALCIUM 9.7 9.6 8.9    Liver Function Tests:  Recent Labs Lab 10/04/14 0450  AST 32  ALT 26  ALKPHOS 112  BILITOT 0.7  PROT 6.7  ALBUMIN 3.3*    CBC:  Recent Labs Lab 10/03/14 1129  WBC 7.6  NEUTROABS 6.1  HGB 12.2*  HCT 37.1*  MCV 83.4  PLT 262    Cardiac Enzymes: No results for input(s): CKTOTAL, CKMB, CKMBINDEX, TROPONINI in the last 168 hours.  BNP: Invalid input(s): POCBNP   Radiology: Dg Chest 1 View  10/03/2014   CLINICAL DATA:  Chest congestion and shortness of breath. The patient reports declining health for 3 months.  EXAM: CHEST  1 VIEW  COMPARISON:  PA and lateral chest 03/26/2014.  FINDINGS: The lungs are clear. Heart size is upper normal. No pneumothorax or pleural effusion. No focal bony abnormality.  IMPRESSION: No acute disease.   Electronically Signed   By: Inge Rise M.D.   On: 10/03/2014 17:36   Ct Head Wo Contrast  10/03/2014   CLINICAL DATA:  Altered mental status decline for 3 months.  EXAM: CT HEAD WITHOUT CONTRAST  TECHNIQUE: Contiguous axial images were obtained from the base of the skull through the vertex without intravenous contrast.  COMPARISON:  CT 07/10/2013  FINDINGS: No acute intracranial hemorrhage. No focal mass lesion. No CT evidence of acute infarction. No midline shift or mass effect. No hydrocephalus. Basilar cisterns are patent.  Remote infarction in the left centrum semiovale. There is generalized cortical atrophy. Partial ventricular dilatation. Mild periventricular white matter hypodensities.  13 mm round lesion in the sella which have has low-density is not changed from prior.  IMPRESSION: 1. No acute intracranial findings. 2. Chronic atrophy and white matter microvascular disease. 3. 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 4. Stable low-density cystic lesion in the sella   Electronically Signed   By: Suzy Bouchard M.D.   On: 10/03/2014 14:16   Dg Toe 5th  Left  10/03/2014   CLINICAL DATA:  One month history of persistent skin ulceration. Pain and swelling.  EXAM: DG TOE 5TH LEFT  COMPARISON:  09/12/2014  FINDINGS: Knee PIP joint appears slightly narrowed compared to the prior study. There is air surrounding the joint and there is slight cortical irregularity along the medial aspect of the distal aspect of the  proximal phalanx. Could not exclude septic arthritis and osteomyelitis. MRI is recommended for further evaluation.  IMPRESSION: Diffuse soft tissue swelling involving the fifth toe with air in the soft tissues.  Could not exclude septic arthritis and osteomyelitis centered at the PIP joint of the fifth toe. MRI without and with contrast is recommended for further evaluation.   Electronically Signed   By: Marijo Sanes M.D.   On: 10/03/2014 12:53     ECG: SR, LBBB with NSVT and PVC's.    Impression and Recommendations  1. LBBB with NSVT: I have discussed this case with Dr. Haroldine Laws via phone as he is DOD at [facility] today. He has reviewed the strips as well. Will continue him on metoprolol to control HR and check his Mg+ and daily BMET. Keep K+ at 4.0 . Can titrate up BB to 50 mg BID if BP tolerates. No plans for ischemic testing. EF has improved since last echo documented. No ACE/ARB with renal insufficiency.   2. ICM: Improvement in EF this admission. Agree with holding spironolactone and lasix for now.   3. ETOH Abuse: Hx of this and caregiver states he has been drinking "moonshine" at home. She caught him once, and is unsure how often or how much he is drinking. He appears to have evidence of DT'.. Dr. Caryn Section to address.   4. Gangrenous left toes: Not a candidate for surgery at this time.   5. Renal insufficiency. Improving.   Signed: Phill Myron. Lawrence NP [place]  10/04/2014, 5:01 PM Co-Sign MD  Case reviewed by telephone (patient at [facility]) with Ms. Lawrence. Chart reviewed including ECGs, telemetry strips and labs. Has baseline LBBB  with runs NSVT. Now DNR. Agree with b-blocker. Keep K >= 4.0 Mg > 2.0. If ectopy increases can consider amiodarone.   TRUE Shackleford,MD 7:49 PM

## 2014-10-04 NOTE — Progress Notes (Signed)
*  PRELIMINARY RESULTS* Echocardiogram 2D Echocardiogram has been performed.  Scott [name] 10/04/2014, 12:15 PM

## 2014-10-04 NOTE — Progress Notes (Signed)
CRITICAL VALUE ALERT  Critical value received: PT/INR 71.6/9.29  Date of notification:  10/04/2014  Time of notification:  0615  Critical value read back: yes  Nurse who received alert:  R. Jacqualyn Posey RN  MD notified (1st page):  Dr. Susanne Borders  Time of first page:  0615  MD notified (2nd page):  Time of second page:  Responding MD:  Dr. Susanne Borders  Time MD responded:  [phone]

## 2014-10-04 NOTE — Progress Notes (Signed)
[facility] PROGRESS NOTE  Scott [name] JYN:[id number] DOB: 17-Dec-1921 DOA: 10/03/2014 PCP: Redge Gainer, MD    Code Status: DO NOT RESUSCITATE Family Communication: Discussion with son pending Disposition Plan: Discharge when clinically appropriate   Consultants:  Cardiology pending  Procedures:  2-D echocardiogram pending  Antibiotics:  Vancomycin 6/23 >   Zosyn 6/23>   HPI/Subjective: Patient denies chest pain or shortness of breath at rest. He has no complaints of left foot pain currently.  Objective: Filed Vitals:   10/04/14 0813  BP:   Pulse:   Temp: 98.6 F (37 C)  Resp:    temperature 98.6. Pulse 102. (Heart rate ranging from 110 to 130 nonsustained). Respiratory rate 19. Blood pressure 107/82. (136/62 on telemetry). Oxygen saturation 95%.   Intake/Output Summary (Last 24 hours) at 10/04/14 0824 Last data filed at 10/04/14 8657  Gross per 24 hour  Intake   1600 ml  Output    675 ml  Net    925 ml   Filed Weights   10/03/14 1123 10/03/14 1730 10/04/14 0457  Weight: 72.576 kg (160 lb) 69.1 kg (152 lb 5.4 oz) 70.2 kg (154 lb 12.2 oz)    Exam:   General: Elderly African-American man in no acute distress.  Cardiovascular: S1, S2, with tachycardia.  Respiratory: decreased breath sounds in the bases, otherwise clear. Breathing is nonlabored.  Abdomen: positive bowel sounds, soft, nontender, nondistended.  Musculoskeletal/extremities: Left lower extremity is grossly edematous at approximately 2+ with 3-4+ edema of the left foot; mild resolving erythema of the pretibial area of the left lower extremity; the left fifth toe is gangrenous and cold to touch and mildly tender-there is a deep fissure between the left fourth and fifth toes with a small amount of malodorous drainage; no palpable pulse on the left lower extremity. Right lower extremity without edema and with a palpable pulse.  Neurologic: The patient is alert and talkative. He is oriented  to himself in the hospital. He is slightly confused. His speech is clear and he follows commands.  Data Reviewed: Basic Metabolic Panel:  Recent Labs Lab 10/03/14 1129 10/03/14 1808 10/04/14 0450  NA 134* 140 139  K 6.0* 5.6* 4.9  CL 101 106 106  CO2 26 27 23   GLUCOSE 118* 105* 101*  BUN 74* 65* 54*  CREATININE 2.83* 2.17* 1.74*  CALCIUM 9.7 9.6 8.9   Liver Function Tests:  Recent Labs Lab 10/04/14 0450  AST 32  ALT 26  ALKPHOS 112  BILITOT 0.7  PROT 6.7  ALBUMIN 3.3*   No results for input(s): LIPASE, AMYLASE in the last 168 hours. No results for input(s): AMMONIA in the last 168 hours. CBC:  Recent Labs Lab 10/03/14 1129  WBC 7.6  NEUTROABS 6.1  HGB 12.2*  HCT 37.1*  MCV 83.4  PLT 262   Cardiac Enzymes: No results for input(s): CKTOTAL, CKMB, CKMBINDEX, TROPONINI in the last 168 hours. BNP (last 3 results)  Recent Labs  03/26/14 1511  BNP 137.5*    ProBNP (last 3 results) No results for input(s): PROBNP in the last 8760 hours.  CBG: No results for input(s): GLUCAP in the last 168 hours.  Recent Results (from the past 240 hour(s))  Blood culture (routine x 2)     Status: None (Preliminary result)   Collection Time: 10/03/14 11:30 AM  Result Value Ref Range Status   Specimen Description BLOOD RIGHT ANTECUBITAL  Final   Special Requests BOTTLES DRAWN AEROBIC AND ANAEROBIC [place]  Final  Culture NO GROWTH < 24 HOURS  Final   Report Status PENDING  Incomplete  Blood culture (routine x 2)     Status: None (Preliminary result)   Collection Time: 10/03/14 12:10 PM  Result Value Ref Range Status   Specimen Description BLOOD LEFT ANTECUBITAL  Final   Special Requests BOTTLES DRAWN AEROBIC ONLY 8CC  Final   Culture NO GROWTH < 24 HOURS  Final   Report Status PENDING  Incomplete  MRSA PCR Screening     Status: Abnormal   Collection Time: 10/03/14  5:27 PM  Result Value Ref Range Status   MRSA by PCR POSITIVE (A) NEGATIVE Final    Comment:         The GeneXpert MRSA Assay (FDA approved for NASAL specimens only), is one component of a comprehensive MRSA colonization surveillance program. It is not intended to diagnose MRSA infection nor to guide or monitor treatment for MRSA infections. RESULT CALLED TO, READ BACK BY AND VERIFIED WITH: Bethann Humble AT 2200 ON [id number] BY FORSYTH K      Studies: Dg Chest 1 View  10/03/2014   CLINICAL DATA:  Chest congestion and shortness of breath. The patient reports declining health for 3 months.  EXAM: CHEST  1 VIEW  COMPARISON:  PA and lateral chest 03/26/2014.  FINDINGS: The lungs are clear. Heart size is upper normal. No pneumothorax or pleural effusion. No focal bony abnormality.  IMPRESSION: No acute disease.   Electronically Signed   By: Inge Rise M.D.   On: 10/03/2014 17:36   Ct Head Wo Contrast  10/03/2014   CLINICAL DATA:  Altered mental status decline for 3 months.  EXAM: CT HEAD WITHOUT CONTRAST  TECHNIQUE: Contiguous axial images were obtained from the base of the skull through the vertex without intravenous contrast.  COMPARISON:  CT 07/10/2013  FINDINGS: No acute intracranial hemorrhage. No focal mass lesion. No CT evidence of acute infarction. No midline shift or mass effect. No hydrocephalus. Basilar cisterns are patent.  Remote infarction in the left centrum semiovale. There is generalized cortical atrophy. Partial ventricular dilatation. Mild periventricular white matter hypodensities.  13 mm round lesion in the sella which have has low-density is not changed from prior.  IMPRESSION: 1. No acute intracranial findings. 2. Chronic atrophy and white matter microvascular disease. 3. 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 4. Stable low-density cystic lesion in the sella   Electronically Signed   By: Suzy Bouchard M.D.   On: 10/03/2014 14:16   Dg Toe 5th Left  10/03/2014   CLINICAL DATA:  One month history of persistent skin ulceration. Pain and swelling.  EXAM: DG TOE 5TH  LEFT  COMPARISON:  09/12/2014  FINDINGS: Knee PIP joint appears slightly narrowed compared to the prior study. There is air surrounding the joint and there is slight cortical irregularity along the medial aspect of the distal aspect of the proximal phalanx. Could not exclude septic arthritis and osteomyelitis. MRI is recommended for further evaluation.  IMPRESSION: Diffuse soft tissue swelling involving the fifth toe with air in the soft tissues.  Could not exclude septic arthritis and osteomyelitis centered at the PIP joint of the fifth toe. MRI without and with contrast is recommended for further evaluation.   Electronically Signed   By: Marijo Sanes M.D.   On: 10/03/2014 12:53    Scheduled Meds: . Chlorhexidine Gluconate Cloth  6 each Topical Q0600  . donepezil  10 mg Oral QHS  . febuxostat  40 mg  Oral Daily  . fluticasone  2 spray Each Nare Daily  . gabapentin  200 mg Oral BID  . isosorbide mononitrate  15 mg Oral Daily  . mupirocin ointment  1 application Nasal BID  . phytonadione  2.5 mg Subcutaneous Once  . piperacillin-tazobactam (ZOSYN)  IV  2.25 g Intravenous 3 times per day  . simvastatin  10 mg Oral Daily  . tamsulosin  0.4 mg Oral QPC breakfast  . [START ON 10/05/2014] vancomycin  1,000 mg Intravenous Q48H   Continuous Infusions: . sodium chloride 0.45 % 1,000 mL with sodium bicarbonate 50 mEq infusion 70 mL/hr at 10/04/14 0405    Assessment and plan:  Principal Problem:   Gangrene of toe Active Problems:   Sepsis due to other etiology   Hypothermia   Ischemic ulcer of left foot   Atherosclerotic PVD with ulceration   Cellulitis of left leg   Wide-complex tachycardia   Chronic systolic heart failure   NICM (nonischemic cardiomyopathy)- EF 20-25% by Echo 04/12/13   UTI (lower urinary tract infection)   Warfarin-induced coagulopathy   Atrial fibrillation with RVR   Acute kidney injury   Hyperkalemia  Gangrene of the left fifth toe, in the setting of atherosclerotic  PVD with ulceration between the left fourth and fifth toes/ischemic ulcer, and mild cellulitis of the left leg.  The patient was recently evaluated by vascular surgeon, Dr. Kellie Simmering on 09/24/14. He recommended to the patient and the patient's family a left above-the-knee amputation. He did not believe the patient was a candidate for open bypass procedure given his extensive occlusion of the superficial femoral popliteal and tibial vessels-angioplasty and stenting would not be successful, particularly in the setting of recent renal insufficiency. On admission, the x-ray of his foot revealed diffuse soft tissue swelling involving the fifth toe with air in the soft tissues, consistent with gangrene. Septic arthritis or osteomyelitis cannot be ruled out. -The patient has been started on vancomycin and Zosyn. Blood cultures have been ordered and are negative to date. - Will hold on ordering an MRI as I don't believe it will change the management. - Ultimately, the patient needs an amputation as stated by Dr. Kellie Simmering. Apparently, he discussed this with the patient's son and daughter-in-law who is an Doctor, general practice in [place] on 09/24/14. The patient will obviously be a high risk for surgery. We'll try to contact the patient's son today to give him an update and to ask about his decision regarding amputation.  Possible UTI.  Will continue and about excessive bone. Urine culture is pending. Sepsis and mild lactic acidosis likely secondary to left fifth toe gangrene and UTI.  The patient's lactic acid was initially 1.3, but increased to 3.2, but has decreased to 2.4.. Blood cultures and a urine culture results are pending. We'll continue antibiotics as stated above. Hypothermia, secondary to sepsis.  His temperature on admission was 96.6. It has improved and normalized. Acute kidney injury.  One year ago, the patient's creatinine was 1.1-1.5.  On admission, it is 2.83. With hydration, treatment of infections, it  has improved to 1.74. Will continue to hold Lasix, losartan, and spironolactone and will continue gentle IV fluid hydration.   Etiology likely prerenal or possibly ATN from the infection and hypovolemia and/or from chronic ARB treatment.  Hyperkalemia.  Patient's serum potassium was 6.0 on admission. With treatment of bicarbonate, Kayexalate, and calcium carbonate, his serum potassium has improved. We'll discontinue bicarbonate in his IV fluids. Etiology was likely secondary to outpatient potassium  supplementation, spironolactone, and losartan. -We'll continue to hold these medications for another 24 hours. Warfarin-induced coagulopathy. The patient is treated with Coumadin chronically for his history of bilateral DVT and PE. His INR was 7.27 on admission. It has increased to 9.29. Although there is no sign of bleeding, will choose to give him 2.5 mg of vitamin K subcutaneous empirically. -We'll continue to monitor his INR daily. Atrial fibrillation with RVR with recent wide-complex tachycardia. The patient has no history of atrial fibrillation, but he does have a history of nonsustained ventricular tachycardia and PVCs. He was deemed not to be an ICD candidate I cardiology. He has been IV fluid hydration, given intermittent IV metoprolol and diltiazem with some improvement. -Now that his blood pressures better, will restart Toprol-XL. -We'll check a TSH -We'll consult cardiology for further management recommendations. Nonischemic cardiomyopathy/chronic systolic heart failure.  Patient's 2-D echocardiogram 04/2013 revealed an ejection fraction of 20-25%. He is treated chronically with losartan, Lasix, spironolactone, and metoprolol.  On admission, he appeared to be volume depleted. His chest x-ray revealed no acute findings.  -We'll continue gentle IV fluid hydration and holding ARB, spironolactone, and Lasix. Will restart metoprolol at a lower dose.  -2-D echocardiogram has been ordered and is  pending.  -Cardiology consult is pending.    Time spent: 45 minutes.    [facility] Pager [phone]. If 7PM-7AM, please contact night-coverage at [email or web address], password [facility] 10/04/2014, 8:24 AM  LOS: 1 day

## 2014-10-04 NOTE — Progress Notes (Signed)
ANTIBIOTIC CONSULT NOTE  Pharmacy Consult for Zosyn & Vancomycin  Indication: Wound infection  Allergies  Allergen Reactions  . Lipitor [Atorvastatin] Other (See Comments)    unknown    Patient Measurements: Height: 5\' 10"  (177.8 cm) Weight: 154 lb 12.2 oz (70.2 kg) IBW/kg (Calculated) : 73  Vital Signs: Temp: 98.6 F (37 C) (06/24 0813) Temp Source: Oral (06/24 0813) BP: 107/82 mmHg (06/24 0700) Pulse Rate: 102 (06/24 0700) Intake/Output from previous day: 06/23 0701 - 06/24 0700 In: 1446 [P.O.:90; I.V.:756; IV Piggyback:600] Out: 300 [Urine:300] Intake/Output from this shift: Total I/O In: 154 [I.V.:154] Out: 375 [Urine:375]  Labs:  Recent Labs  10/03/14 1129 10/03/14 1808 10/04/14 0450  WBC 7.6  --   --   HGB 12.2*  --   --   PLT 262  --   --   CREATININE 2.83* 2.17* 1.74*   Estimated Creatinine Clearance: 27.5 mL/min (by C-G formula based on Cr of 1.74). No results for input(s): VANCOTROUGH, VANCOPEAK, VANCORANDOM, GENTTROUGH, GENTPEAK, GENTRANDOM, TOBRATROUGH, TOBRAPEAK, TOBRARND, AMIKACINPEAK, AMIKACINTROU, AMIKACIN in the last 72 hours.   Microbiology: Recent Results (from the past 720 hour(s))  Blood culture (routine x 2)     Status: None (Preliminary result)   Collection Time: 10/03/14 11:30 AM  Result Value Ref Range Status   Specimen Description BLOOD RIGHT ANTECUBITAL  Final   Special Requests BOTTLES DRAWN AEROBIC AND ANAEROBIC 6CC EACH  Final   Culture NO GROWTH < 24 HOURS  Final   Report Status PENDING  Incomplete  Blood culture (routine x 2)     Status: None (Preliminary result)   Collection Time: 10/03/14 12:10 PM  Result Value Ref Range Status   Specimen Description BLOOD LEFT ANTECUBITAL  Final   Special Requests BOTTLES DRAWN AEROBIC ONLY 8CC  Final   Culture NO GROWTH < 24 HOURS  Final   Report Status PENDING  Incomplete  MRSA PCR Screening     Status: Abnormal   Collection Time: 10/03/14  5:27 PM  Result Value Ref Range Status   MRSA by PCR POSITIVE (A) NEGATIVE Final    Comment:        The GeneXpert MRSA Assay (FDA approved for NASAL specimens only), is one component of a comprehensive MRSA colonization surveillance program. It is not intended to diagnose MRSA infection nor to guide or monitor treatment for MRSA infections. RESULT CALLED TO, READ BACK BY AND VERIFIED WITH: Bethann Humble AT 2200 ON [id number] BY FORSYTH K     Medical History: Past Medical History  Diagnosis Date  . HTN (hypertension)   . Hyperlipidemia   . PVCs (premature ventricular contractions)   . Glaucoma   . PVD (peripheral vascular disease)     Right ABI .75, Left .78 (2006)  . Popliteal artery aneurysm, bilateral DECUMENTED CHRONIC PARTIAL OCCLUSION--  PT DENIES CLAUDICATION OR ANY OTHER SYMPTOMS  . Frequency of urination   . Nocturia   . Bladder cancer 03/15/2012  . Chronic systolic CHF (congestive heart failure)     a. NICM - patent cors 06/2012, EF 40% at that time. b. 03/2013 eval: EF 20-25%.  . DVT, bilateral lower limbs 03/2013    a. Bilat DVT dx 03/2013.  . Pulmonary embolism     a. By CT angio 04/2013.  Marland Kitchen Elevated troponin     a. Adm 12/30-04/2013 - not felt to represent ACS; ?due to PE. b. Patent cors 06/2012.  Marland Kitchen NSVT (nonsustained ventricular tachycardia)     a. NSVT 06/2012; NSVT  also seen during 03/2013 adm. b. Med rx. Not candidate for ICD given adv age.  Marland Kitchen ETOH abuse   . Syncope     a. Felt to be postural syncope related to diuretics 06/2012.  Marland Kitchen Heart murmur   . DVT (deep venous thrombosis)     "I think in both legs"  . Pneumonia     "couple times"  . DDD (degenerative disc disease)   . Arthritis     "all over"  . Chronic back pain   . Depression   . CAD- non obstructive disease by cath 3/14   . PAD (peripheral artery disease)     Medications:  Prescriptions prior to admission  Medication Sig Dispense Refill Last Dose  . donepezil (ARICEPT) 10 MG tablet TAKE ONE TABLET AT BEDTIME 30 tablet 2 Unknown  .  fluticasone (FLONASE) 50 MCG/ACT nasal spray Place 2 sprays into both nostrils daily. 16 g 6 Unknown  . furosemide (LASIX) 40 MG tablet 40 MG ON MON, WED, AND FRI'S; THEN 20 MG ALL OTHER DAYS 30 tablet 5 Unknown  . gabapentin (NEURONTIN) 100 MG capsule TAKE (2) CAPSULES TWICE DAILY. 120 capsule 2 Unknown  . HORSE CHESTNUT PO Take 1 capsule by mouth daily.   Unknown  . HYDROcodone-acetaminophen (NORCO/VICODIN) 5-325 MG per tablet Take 1 tablet by mouth every 6 (six) hours as needed for moderate pain or severe pain. 60 tablet 0 10/03/2014 at Unknown time  . isosorbide mononitrate (IMDUR) 30 MG 24 hr tablet Take 0.5 tablets (15 mg total) by mouth daily. 30 tablet 5 Unknown  . lidocaine (XYLOCAINE) 2 % jelly APPLY AS NEEDED TO THE LEFT FOOD ULCER  2 Unknown  . losartan (COZAAR) 25 MG tablet Take 1 tablet (25 mg total) by mouth daily. 90 tablet 3 Unknown  . metoprolol succinate (TOPROL-XL) 50 MG 24 hr tablet TAKE (1) TABLET TWICE A DAY. 60 tablet 0 Unknown  . nitroGLYCERIN (NITROSTAT) 0.4 MG SL tablet Place 1 tablet (0.4 mg total) under the tongue every 5 (five) minutes as needed for chest pain (up to 3 doses). 25 tablet 3 Unknown  . potassium chloride SA (K-DUR,KLOR-CON) 20 MEQ tablet Take 1 tablet (20 mEq total) by mouth daily. 90 tablet 1 Unknown  . simvastatin (ZOCOR) 10 MG tablet TAKE 1 TABLET DAILY 30 tablet 4 Unknown  . spironolactone (ALDACTONE) 25 MG tablet TAKE 1 TABLET DAILY 30 tablet 3 unknown  . tamsulosin (FLOMAX) 0.4 MG CAPS capsule TAKE (1) CAPSULE DAILY 30 capsule 5 Unknown  . ULORIC 40 MG tablet TAKE 1 TABLET DAILY 30 tablet 4 Unknown  . warfarin (COUMADIN) 2.5 MG tablet TAKE UP TO 3 TABLETS DAILY AS DIRECTED BY CPP 90 tablet 2 Unknown  . doxycycline (VIBRA-TABS) 100 MG tablet Take 1 tablet (100 mg total) by mouth 2 (two) times daily. With food (Patient not taking: Reported on 10/03/2014) 28 tablet 0 Taking  . meclizine (ANTIVERT) 32 MG tablet Take 1 tablet (32 mg total) by mouth 3  (three) times daily as needed. (Patient not taking: Reported on 10/03/2014) 30 tablet 0 Taking  . megestrol (MEGACE) 20 MG tablet TAKE 1 TABLET ONCE A DAY 30 tablet 3 Unknown  . mupirocin ointment (BACTROBAN) 2 % Apply small amt with qtip to wound after soaking TID (Patient not taking: Reported on 10/03/2014) 22 g 0 Taking   Assessment: 81 yo M with admitted with generalized weakness & pain at site of chronic LLE ulcer.   Foot Xray cannot exclude septic arthritis or  osteomyelitis.  He was started on empiric, broad-spectrum antibiotics.  Surgical plan pending.  Renal function has improved with hydration.    Goal of Therapy:  Eradicate infection  Plan:  Increase Zosyn 3.375gm IV Q8h to be infused over 4hrs Increase Vancomycin 750mg  IV q24h Check Vancomycin trough at steady state Monitor renal function and cx data   Scott [name] 10/04/2014,8:23 AM

## 2014-10-05 ENCOUNTER — Inpatient Hospital Stay (HOSPITAL_COMMUNITY): Payer: Medicare Other

## 2014-10-05 DIAGNOSIS — R000 Tachycardia, unspecified: Secondary | ICD-10-CM | POA: Diagnosis present

## 2014-10-05 DIAGNOSIS — J690 Pneumonitis due to inhalation of food and vomit: Secondary | ICD-10-CM | POA: Diagnosis present

## 2014-10-05 DIAGNOSIS — L03116 Cellulitis of left lower limb: Secondary | ICD-10-CM

## 2014-10-05 LAB — BASIC METABOLIC PANEL
Anion gap: 7 (ref 5–15)
BUN: 29 mg/dL — ABNORMAL HIGH (ref 6–20)
CO2: 26 mmol/L (ref 22–32)
Calcium: 8.7 mg/dL — ABNORMAL LOW (ref 8.9–10.3)
Chloride: 106 mmol/L (ref 101–111)
Creatinine, Ser: 1.16 mg/dL (ref 0.61–1.24)
GFR calc Af Amer: >60 mL/min (ref >60)
GFR calc non Af Amer: 53 mL/min — ABNORMAL LOW (ref >60)
Glucose, Bld: 94 mg/dL (ref 65–99)
Potassium: 4.1 mmol/L (ref 3.5–5.1)
Sodium: 139 mmol/L (ref 135–145)

## 2014-10-05 LAB — CBC
HCT: 31.5 % — ABNORMAL LOW (ref 39.0–52.0)
Hemoglobin: 10.3 g/dL — ABNORMAL LOW (ref 13.0–17.0)
MCH: 27.4 pg (ref 26.0–34.0)
MCHC: 32.7 g/dL (ref 30.0–36.0)
MCV: 83.8 fL (ref 78.0–100.0)
Platelets: 265 10*3/uL (ref 150–400)
RBC: 3.76 MIL/uL — ABNORMAL LOW (ref 4.22–5.81)
RDW: 14.1 % (ref 11.5–15.5)
WBC: 8.5 10*3/uL (ref 4.0–10.5)

## 2014-10-05 LAB — URINE CULTURE: Culture: NO GROWTH

## 2014-10-05 LAB — PROTIME-INR
INR: 3.72 — ABNORMAL HIGH (ref 0.00–1.49)
Prothrombin Time: 36 seconds — ABNORMAL HIGH (ref 11.6–15.2)

## 2014-10-05 LAB — VITAMIN B12: Vitamin B-12: 519 pg/mL (ref 180–914)

## 2014-10-05 LAB — TSH: TSH: 1.273 u[IU]/mL (ref 0.350–4.500)

## 2014-10-05 MED ORDER — FUROSEMIDE 10 MG/ML IJ SOLN
20.0000 mg | Freq: Once | INTRAMUSCULAR | Status: AC
  Administered 2014-10-05: 20 mg via INTRAVENOUS
  Filled 2014-10-05: qty 2

## 2014-10-05 MED ORDER — ENSURE ENLIVE PO LIQD
237.0000 mL | Freq: Two times a day (BID) | ORAL | Status: DC
  Administered 2014-10-05 – 2014-10-08 (×7): 237 mL via ORAL

## 2014-10-05 MED ORDER — STARCH (THICKENING) PO POWD
ORAL | Status: DC | PRN
  Filled 2014-10-05: qty 227

## 2014-10-05 MED ORDER — LEVALBUTEROL HCL 0.63 MG/3ML IN NEBU
0.6300 mg | INHALATION_SOLUTION | Freq: Three times a day (TID) | RESPIRATORY_TRACT | Status: DC
  Administered 2014-10-05 – 2014-10-08 (×9): 0.63 mg via RESPIRATORY_TRACT
  Filled 2014-10-05 (×8): qty 3

## 2014-10-05 MED ORDER — VANCOMYCIN HCL 10 G IV SOLR
1250.0000 mg | INTRAVENOUS | Status: DC
  Administered 2014-10-05 – 2014-10-07 (×3): 1250 mg via INTRAVENOUS
  Filled 2014-10-05 (×3): qty 1250
  Filled 2014-10-05: qty 1000

## 2014-10-05 MED ORDER — GABAPENTIN 100 MG PO CAPS
100.0000 mg | ORAL_CAPSULE | Freq: Two times a day (BID) | ORAL | Status: DC
  Administered 2014-10-05 – 2014-10-08 (×6): 100 mg via ORAL
  Filled 2014-10-05 (×6): qty 1

## 2014-10-05 MED ORDER — WARFARIN - PHARMACIST DOSING INPATIENT: Status: DC

## 2014-10-05 NOTE — Progress Notes (Signed)
[place] for Coumadin Indication: atrial fibrillation, hx VTE  Allergies  Allergen Reactions  . Lipitor [Atorvastatin] Other (See Comments)    unknown    Patient Measurements: Height: 5\' 10"  (177.8 cm) Weight: 156 lb 4.9 oz (70.9 kg) IBW/kg (Calculated) : 73  Vital Signs: Temp: 97.5 F (36.4 C) (06/25 0755) Temp Source: Oral (06/25 0755) BP: 148/81 mmHg (06/25 0500) Pulse Rate: 83 (06/25 0500)  Labs:  Recent Labs  10/03/14 1129 10/03/14 1130 10/03/14 1808 10/04/14 0450 10/05/14 0520  HGB 12.2*  --   --   --  10.3*  HCT 37.1*  --   --   --  31.5*  PLT 262  --   --   --  265  LABPROT  --  59.6*  --  71.6* 36.0*  INR  --  7.27*  --  9.29* 3.72*  CREATININE 2.83*  --  2.17* 1.74* 1.16    Estimated Creatinine Clearance: 41.6 mL/min (by C-G formula based on Cr of 1.16).   Medical History: Past Medical History  Diagnosis Date  . HTN (hypertension)   . Hyperlipidemia   . PVCs (premature ventricular contractions)   . Glaucoma   . PVD (peripheral vascular disease)     Right ABI .75, Left .78 (2006)  . Popliteal artery aneurysm, bilateral DECUMENTED CHRONIC PARTIAL OCCLUSION--  PT DENIES CLAUDICATION OR ANY OTHER SYMPTOMS  . Frequency of urination   . Nocturia   . Bladder cancer 03/15/2012  . Chronic systolic CHF (congestive heart failure)     a. NICM - patent cors 06/2012, EF 40% at that time. b. 03/2013 eval: EF 20-25%.  . DVT, bilateral lower limbs 03/2013    a. Bilat DVT dx 03/2013.  . Pulmonary embolism     a. By CT angio 04/2013.  Marland Kitchen Elevated troponin     a. Adm 12/30-04/2013 - not felt to represent ACS; ?due to PE. b. Patent cors 06/2012.  Marland Kitchen NSVT (nonsustained ventricular tachycardia)     a. NSVT 06/2012; NSVT also seen during 03/2013 adm. b. Med rx. Not candidate for ICD given adv age.  Marland Kitchen ETOH abuse   . Syncope     a. Felt to be postural syncope related to diuretics 06/2012.  Marland Kitchen Heart murmur   . DVT (deep venous  thrombosis)     "I think in both legs"  . Pneumonia     "couple times"  . DDD (degenerative disc disease)   . Arthritis     "all over"  . Chronic back pain   . Depression   . CAD- non obstructive disease by cath 3/14   . PAD (peripheral artery disease)     Medications:  Prescriptions prior to admission  Medication Sig Dispense Refill Last Dose  . donepezil (ARICEPT) 10 MG tablet TAKE ONE TABLET AT BEDTIME 30 tablet 2 Unknown  . fluticasone (FLONASE) 50 MCG/ACT nasal spray Place 2 sprays into both nostrils daily. 16 g 6 Unknown  . furosemide (LASIX) 40 MG tablet 40 MG ON MON, WED, AND FRI'S; THEN 20 MG ALL OTHER DAYS 30 tablet 5 Unknown  . gabapentin (NEURONTIN) 100 MG capsule TAKE (2) CAPSULES TWICE DAILY. 120 capsule 2 Unknown  . HORSE CHESTNUT PO Take 1 capsule by mouth daily.   Unknown  . HYDROcodone-acetaminophen (NORCO/VICODIN) 5-325 MG per tablet Take 1 tablet by mouth every 6 (six) hours as needed for moderate pain or severe pain. 60 tablet 0 10/03/2014 at Unknown time  . isosorbide  mononitrate (IMDUR) 30 MG 24 hr tablet Take 0.5 tablets (15 mg total) by mouth daily. 30 tablet 5 Unknown  . lidocaine (XYLOCAINE) 2 % jelly APPLY AS NEEDED TO THE LEFT FOOD ULCER  2 Unknown  . losartan (COZAAR) 25 MG tablet Take 1 tablet (25 mg total) by mouth daily. 90 tablet 3 Unknown  . metoprolol succinate (TOPROL-XL) 50 MG 24 hr tablet TAKE (1) TABLET TWICE A DAY. 60 tablet 0 Unknown  . nitroGLYCERIN (NITROSTAT) 0.4 MG SL tablet Place 1 tablet (0.4 mg total) under the tongue every 5 (five) minutes as needed for chest pain (up to 3 doses). 25 tablet 3 Unknown  . potassium chloride SA (K-DUR,KLOR-CON) 20 MEQ tablet Take 1 tablet (20 mEq total) by mouth daily. 90 tablet 1 Unknown  . simvastatin (ZOCOR) 10 MG tablet TAKE 1 TABLET DAILY 30 tablet 4 Unknown  . spironolactone (ALDACTONE) 25 MG tablet TAKE 1 TABLET DAILY 30 tablet 3 unknown  . tamsulosin (FLOMAX) 0.4 MG CAPS capsule TAKE (1) CAPSULE  DAILY 30 capsule 5 Unknown  . ULORIC 40 MG tablet TAKE 1 TABLET DAILY 30 tablet 4 Unknown  . warfarin (COUMADIN) 2.5 MG tablet TAKE UP TO 3 TABLETS DAILY AS DIRECTED BY CPP 90 tablet 2 Unknown  . doxycycline (VIBRA-TABS) 100 MG tablet Take 1 tablet (100 mg total) by mouth 2 (two) times daily. With food (Patient not taking: Reported on 10/03/2014) 28 tablet 0 Taking  . meclizine (ANTIVERT) 32 MG tablet Take 1 tablet (32 mg total) by mouth 3 (three) times daily as needed. (Patient not taking: Reported on 10/03/2014) 30 tablet 0 Taking  . megestrol (MEGACE) 20 MG tablet TAKE 1 TABLET ONCE A DAY 30 tablet 3 Unknown  . mupirocin ointment (BACTROBAN) 2 % Apply small amt with qtip to wound after soaking TID (Patient not taking: Reported on 10/03/2014) 22 g 0 Taking    Assessment: 81 yo M on chronic Coumadin PTA for hx Afib & VTE.  INR supra-therapeutic on admission.  He was given 3mg  Vit K sq x1 on 6/24.  INR trending down, but still above goal range.  No bleeding noted.  Her outpatient [facility] flowsheet patient's Coumadin was decreased to 5mg  daily except 7.5mg  on Mon & Fri ~ 1 month ago.    Goal of Therapy:  INR 2-3   Plan:  Hold Coumadin again today Daily INR  Biagio Borg 10/05/2014,8:43 AM

## 2014-10-05 NOTE — Progress Notes (Signed)
Pt's son called and I gave him an update over the phone. He asked that I have Dr. Caryn Section call his wife (Dr. Jamie Kato) with an update on pt's status. Dr. Caryn Section has been paged with message and a contact number for Dr. Wynetta Emery.

## 2014-10-05 NOTE — Progress Notes (Signed)
[facility] PROGRESS NOTE  Scott [name] CZY:[id number] DOB: 1922/02/23 DOA: 10/03/2014 PCP: Redge Gainer, MD    Code Status: DO NOT RESUSCITATE Family Communication: Discussion wife and son and caretaker on 6/24. Disposition Plan: Discharge when clinically appropriate   Consultants:  Cardiology   Procedures:  10/04/2014 - 2-D echocardiogram : Study conclusions- Left ventricle: Abnormal septal motion The cavity size was normal. There was mild concentric hypertrophy. Systolic function was normal. The estimated ejection fraction was in the range of 50% to 55%. Doppler parameters are consistent with abnormal left ventricular relaxation (grade 1 diastolic dysfunction). - Aortic valve: There was trivial regurgitation. - Mitral valve: Calcified annulus.  Antibiotics:  Vancomycin 6/23 >   Zosyn 6/23>   HPI/Subjective: Patient has had intermittent confusion, during the late night and early morning hours. He does not complain of foot pain, chest pain, or shortness of breath.  Objective: Filed Vitals:   10/05/14 0843  BP:   Pulse: 90  Temp:   Resp: 8   temperature 97.5. Pulse 83 (95 on telemetry) blood pressure 158/81. Oxygen saturation 100% on nasal cannula oxygen.  Intake/Output Summary (Last 24 hours) at 10/05/14 0932 Last data filed at 10/05/14 0846  Gross per 24 hour  Intake 2366.17 ml  Output    325 ml  Net 2041.17 ml   Filed Weights   10/03/14 1730 10/04/14 0457 10/05/14 0500  Weight: 69.1 kg (152 lb 5.4 oz) 70.2 kg (154 lb 12.2 oz) 70.9 kg (156 lb 4.9 oz)    Exam:   General: Elderly African-American man who appears more confused and agitated.  Cardiovascular: S1, S2, with occasional ectopy  Respiratory: Upper airway wheezes and crackles; wet cough.  Abdomen: positive bowel sounds, soft, nontender, nondistended.  Musculoskeletal/extremities: Left lower extremity is grossly edematous at approximately 2+ with 3 plus edema of the left foot;  resolved erythema of the pretibial area of the left lower extremity; the left fifth toe is gangrenous and cold to touch and mildly tender-there is a deep fissure between the left fourth and fifth toes with no visible drainage currently; no palpable pulse on the left lower extremity. Right lower extremity without edema and with a palpable pulse.  Neurologic: The patient is alert, but confused. He is oriented to himself. He believes he is in [place], but when redirected, he realizes that he is in the hospital. His speech is clear.  Data Reviewed: Basic Metabolic Panel:  Recent Labs Lab 10/03/14 1129 10/03/14 1808 10/04/14 0450 10/04/14 1846 10/05/14 0520  NA 134* 140 139  --  139  K 6.0* 5.6* 4.9  --  4.1  CL 101 106 106  --  106  CO2 26 27 23   --  26  GLUCOSE 118* 105* 101*  --  94  BUN 74* 65* 54*  --  29*  CREATININE 2.83* 2.17* 1.74*  --  1.16  CALCIUM 9.7 9.6 8.9  --  8.7*  MG  --   --   --  1.9  --    Liver Function Tests:  Recent Labs Lab 10/04/14 0450  AST 32  ALT 26  ALKPHOS 112  BILITOT 0.7  PROT 6.7  ALBUMIN 3.3*   No results for input(s): LIPASE, AMYLASE in the last 168 hours. No results for input(s): AMMONIA in the last 168 hours. CBC:  Recent Labs Lab 10/03/14 1129 10/05/14 0520  WBC 7.6 8.5  NEUTROABS 6.1  --   HGB 12.2* 10.3*  HCT 37.1* 31.5*  MCV 83.4 83.8  PLT 262 265   Cardiac Enzymes: No results for input(s): CKTOTAL, CKMB, CKMBINDEX, TROPONINI in the last 168 hours. BNP (last 3 results)  Recent Labs  03/26/14 1511  BNP 137.5*    ProBNP (last 3 results) No results for input(s): PROBNP in the last 8760 hours.  CBG: No results for input(s): GLUCAP in the last 168 hours.  Recent Results (from the past 240 hour(s))  Blood culture (routine x 2)     Status: None (Preliminary result)   Collection Time: 10/03/14 11:30 AM  Result Value Ref Range Status   Specimen Description BLOOD RIGHT ANTECUBITAL  Final   Special Requests BOTTLES  DRAWN AEROBIC AND ANAEROBIC 6CC EACH  Final   Culture NO GROWTH < 24 HOURS  Final   Report Status PENDING  Incomplete  Blood culture (routine x 2)     Status: None (Preliminary result)   Collection Time: 10/03/14 12:10 PM  Result Value Ref Range Status   Specimen Description BLOOD LEFT ANTECUBITAL  Final   Special Requests BOTTLES DRAWN AEROBIC ONLY 8CC  Final   Culture NO GROWTH < 24 HOURS  Final   Report Status PENDING  Incomplete  Urine culture     Status: None (Preliminary result)   Collection Time: 10/03/14 12:10 PM  Result Value Ref Range Status   Specimen Description URINE, CATHETERIZED  Final   Special Requests NONE  Final   Culture   Final    NO GROWTH < 24 HOURS Performed at [facility]    Report Status PENDING  Incomplete  MRSA PCR Screening     Status: Abnormal   Collection Time: 10/03/14  5:27 PM  Result Value Ref Range Status   MRSA by PCR POSITIVE (A) NEGATIVE Final    Comment:        The GeneXpert MRSA Assay (FDA approved for NASAL specimens only), is one component of a comprehensive MRSA colonization surveillance program. It is not intended to diagnose MRSA infection nor to guide or monitor treatment for MRSA infections. RESULT CALLED TO, READ BACK BY AND VERIFIED WITH: Bethann Humble AT 2200 ON [id number] BY FORSYTH K      Studies: Dg Chest 1 View  10/03/2014   CLINICAL DATA:  Chest congestion and shortness of breath. The patient reports declining health for 3 months.  EXAM: CHEST  1 VIEW  COMPARISON:  PA and lateral chest 03/26/2014.  FINDINGS: The lungs are clear. Heart size is upper normal. No pneumothorax or pleural effusion. No focal bony abnormality.  IMPRESSION: No acute disease.   Electronically Signed   By: Inge Rise M.D.   On: 10/03/2014 17:36   Ct Head Wo Contrast  10/03/2014   CLINICAL DATA:  Altered mental status decline for 3 months.  EXAM: CT HEAD WITHOUT CONTRAST  TECHNIQUE: Contiguous axial images were obtained from the base of the  skull through the vertex without intravenous contrast.  COMPARISON:  CT 07/10/2013  FINDINGS: No acute intracranial hemorrhage. No focal mass lesion. No CT evidence of acute infarction. No midline shift or mass effect. No hydrocephalus. Basilar cisterns are patent.  Remote infarction in the left centrum semiovale. There is generalized cortical atrophy. Partial ventricular dilatation. Mild periventricular white matter hypodensities.  13 mm round lesion in the sella which have has low-density is not changed from prior.  IMPRESSION: 1. No acute intracranial findings. 2. Chronic atrophy and white matter microvascular disease. 3. 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 4. Stable low-density cystic lesion in the  sella   Electronically Signed   By: Suzy Bouchard M.D.   On: 10/03/2014 14:16   Dg Chest Port 1 View  10/05/2014   CLINICAL DATA:  Shortness of breath, cough, congestion, wheezing.  EXAM: PORTABLE CHEST - 1 VIEW  COMPARISON:  10/03/2014; 03/26/2014; 03/14/2013 ; chest CT- 04/10/2013  FINDINGS: Grossly unchanged enlarged cardiac silhouette and mediastinal contours given decreased lung volumes and patient rotation. Atherosclerotic plaque within thoracic aorta. There is persistent thickening of the right paratracheal stripe secondary to prominent vasculature as demonstrated on prior chest CT performed 03/2013. Pulmonary venous congestion without frank evidence of edema. Worsening bibasilar and right perihilar opacities, right greater than left. No definite pleural effusion or pneumothorax. Unchanged bones.  IMPRESSION: Worsening right perihilar and bibasilar opacities, right greater than left, possibly atelectasis fell aspiration and/or infection could have a similar appearance. Further evaluation with a PA and lateral chest radiograph may be obtained as clinically indicated.   Electronically Signed   By: Sandi Mariscal M.D.   On: 10/05/2014 08:59   Dg Toe 5th Left  10/03/2014   CLINICAL DATA:  One  month history of persistent skin ulceration. Pain and swelling.  EXAM: DG TOE 5TH LEFT  COMPARISON:  09/12/2014  FINDINGS: Knee PIP joint appears slightly narrowed compared to the prior study. There is air surrounding the joint and there is slight cortical irregularity along the medial aspect of the distal aspect of the proximal phalanx. Could not exclude septic arthritis and osteomyelitis. MRI is recommended for further evaluation.  IMPRESSION: Diffuse soft tissue swelling involving the fifth toe with air in the soft tissues.  Could not exclude septic arthritis and osteomyelitis centered at the PIP joint of the fifth toe. MRI without and with contrast is recommended for further evaluation.   Electronically Signed   By: Marijo Sanes M.D.   On: 10/03/2014 12:53    Scheduled Meds: . Chlorhexidine Gluconate Cloth  6 each Topical Q0600  . donepezil  10 mg Oral QHS  . febuxostat  40 mg Oral Daily  . feeding supplement (ENSURE ENLIVE)  237 mL Oral BID BM  . fluticasone  2 spray Each Nare Daily  . folic acid  1 mg Oral Daily  . gabapentin  100 mg Oral BID  . isosorbide mononitrate  15 mg Oral Daily  . metoprolol succinate  25 mg Oral BID  . multivitamin with minerals  1 tablet Oral Daily  . mupirocin ointment  1 application Nasal BID  . piperacillin-tazobactam (ZOSYN)  IV  3.375 g Intravenous Q8H  . simvastatin  10 mg Oral Daily  . tamsulosin  0.4 mg Oral QPC breakfast  . thiamine  100 mg Oral Daily   Or  . thiamine  100 mg Intravenous Daily  . vancomycin  1,250 mg Intravenous Q24H  . [START ON 10/06/2014] Warfarin - Pharmacist Dosing Inpatient   Does not apply Q24H   Continuous Infusions: . sodium chloride 10 mL/hr at 10/05/14 0848    Assessment and plan:  Principal Problem:   Gangrene of toe Active Problems:   Sepsis due to other etiology   Hypothermia   Ischemic ulcer of left foot   Atherosclerotic PVD with ulceration   Cellulitis of left leg   Chronic systolic heart failure   NICM  (nonischemic cardiomyopathy)- EF 20-25% by Echo 04/12/13   UTI (lower urinary tract infection)   Warfarin-induced coagulopathy   Acute kidney injury   Hyperkalemia   Tachyarrhythmia  Gangrene of the left fifth toe, in  the setting of atherosclerotic PVD with ulceration between the left fourth and fifth toes/ischemic ulcer, and mild cellulitis of the left leg.  The patient was recently evaluated by vascular surgeon, Dr. Kellie Simmering on 09/24/14. He recommended to the patient and the patient's family a left above-the-knee amputation. He did not believe the patient was a candidate for open bypass procedure given his extensive occlusion of the superficial femoral popliteal and tibial vessels-angioplasty and stenting would not be successful, particularly in the setting of recent renal insufficiency. On admission, the x-ray of his foot revealed diffuse soft tissue swelling involving the fifth toe with air in the soft tissues, consistent with gangrene. Septic arthritis or osteomyelitis cannot be ruled out.The patient has been started on vancomycin and Zosyn. Blood cultures are negative to date. Will hold on ordering an MRI as I don't believe it will change the management. - Ultimately, the patient needs an amputation as stated by Dr. Kellie Simmering. Apparently, he discussed this with the patient's son and daughter-in-law who is an Doctor, general practice in [place] on 09/24/14. The patient will obviously be a high risk for surgery. -Updated the patient's son [name] regarding his progress on 6/24. [name] prefers that we continue to treat the patient medically and he will decide on a referral back to Dr. Kellie Simmering following discharge. I did give him the option of transferring him to [facility] if an urgent amputation is needed. However, he was informed that the patient was improving clinically and symptomatically. Patient has had no complaints of foot pain. -We'll continue current management and will narrow antibiotics in the next 24  hours.  Possible UTI.  Will continue antibiotics as above. Urine culture is negative today. Sepsis and mild lactic acidosis likely secondary to left fifth toe gangrene and UTI.  The patient's lactic acid was initially 1.3, but increased to 3.2; then decrease to 2.4. . Blood cultures and a urine culture results are negative to date. Clinically, sepsis has resolved. We'll continue antibiotics as stated above. Hypothermia, secondary to sepsis.  His temperature on admission was 96.6. It has improved and normalized. Acute kidney injury.  One year ago, the patient's creatinine was 1.1-1.5.  On admission, it is 2.83. With hydration, treatment of infections, it has improved and normalized. Lasix, losartan, and spironolactone were initially held and he was hydrated with IV fluids. He has some chest congestion, so will give him one IV dose of Lasix and KVO his IV fluids today.   Etiology likely prerenal or possibly ATN from the infection and hypovolemia and/or from chronic ARB treatment.  Hyperkalemia.  Patient's serum potassium was 6.0 on admission. He was treated with bicarbonate, Kayexalate, and calcium carbonate. His serum potassium normalized. Bicarbonate was discontinued and IV fluids. Etiology was likely secondary to outpatient potassium supplementation, spironolactone, and losartan. Warfarin-induced coagulopathy. The patient is treated with Coumadin chronically for his history of bilateral DVT and PE. His INR was 7.27 on admission. It has increased to 9.29. Although there were no sign of bleeding, I choose to give him 2.5 mg of vitamin K subcutaneous empirically. His INR is now below 4. Will ask the pharmacist to monitor and dose to Coumadin daily. Tachyarrhythmia. It was thought that the patient had PAF with rapid ventricular response versus wide complex tachycardia. He has a history of nonsustained V. tach and frequent PVCs. He was deemed not to be an ICD candidate by cardiology in the past. IV  metoprolol and subsequently oral Toprol XL was restarted. He was given IV fluid hydration.  All of these measures helped as his heart rate has decreased some. TSH is pending. Cardiology was consulted. Per cardiologist, Dr. Haroldine Laws, the patient has baseline left bundle branch block with runs of NSVT. He agreed with restarting Toprol-XL with titration up to 50 mg twice a day, the patient's home dose. He recommended consideration of amiodarone if the ectopy increases. -Patient's heart rate and blood pressure have improved. Will consider increasing dosing of Toprol-XL. Nonischemic cardiomyopathy/chronic systolic heart failure.  Patient's 2-D echocardiogram 04/2013 revealed an ejection fraction of 20-25%. He is treated chronically with losartan, Lasix, spironolactone, and metoprolol. These were held on admission secondary to above. Follow-up 2-D echocardiogram on 6/24 revealed an EF of 50-55% and grade 1 diastolic dysfunction. On admission, he appeared to be volume depleted, so he was started on IV fluid hydration. His chest x-ray on admission revealed no acute findings.  -She does have some pulmonary crackles on exam today. Therefore, will KVO the IV fluids, give 20 g of IV Lasix now and order a chest x-ray for further evaluation.  Acute encephalopathy/agitation. The patient has been intermittently agitated, mostly at night and during the early morning hours, which may be secondary to sundowning. He is treated with Aricept, so he likely has mild dementia. However, cardiology nurse practitioner, Ms. 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 The patient and his wife was questioned about this and acknowledged that he had drunk alcohol in the past, but not in one month. Nevertheless, vitamin therapy and as needed Ativan have been ordered for questionable superimposed alcohol withdrawal, though this is not completely  clear. -We'll order TSH and B12 for further evaluation. -We'll discuss this further with his son when available.  Time spent: 40 minutes.    [facility] Pager [phone]. If 7PM-7AM, please contact night-coverage at [email or web address], password [facility] 10/05/2014, 9:32 AM  LOS: 2 days

## 2014-10-05 NOTE — Progress Notes (Signed)
ANTIBIOTIC CONSULT NOTE  Pharmacy Consult for Zosyn & Vancomycin  Indication: Wound infection  Allergies  Allergen Reactions  . Lipitor [Atorvastatin] Other (See Comments)    unknown    Patient Measurements: Height: 5\' 10"  (177.8 cm) Weight: 156 lb 4.9 oz (70.9 kg) IBW/kg (Calculated) : 73  Vital Signs: Temp: 97.5 F (36.4 C) (06/25 0755) Temp Source: Oral (06/25 0755) BP: 148/81 mmHg (06/25 0500) Pulse Rate: 83 (06/25 0500) Intake/Output from previous day: 06/24 0701 - 06/25 0700 In: 2400 [P.O.:420; I.V.:1680; IV Piggyback:300] Out: 700 [Urine:700] Intake/Output from this shift:    Labs:  Recent Labs  10/03/14 1129 10/03/14 1808 10/04/14 0450 10/05/14 0520  WBC 7.6  --   --  8.5  HGB 12.2*  --   --  10.3*  PLT 262  --   --  265  CREATININE 2.83* 2.17* 1.74* 1.16   Estimated Creatinine Clearance: 41.6 mL/min (by C-G formula based on Cr of 1.16). No results for input(s): VANCOTROUGH, VANCOPEAK, VANCORANDOM, GENTTROUGH, GENTPEAK, GENTRANDOM, TOBRATROUGH, TOBRAPEAK, TOBRARND, AMIKACINPEAK, AMIKACINTROU, AMIKACIN in the last 72 hours.   Microbiology: Recent Results (from the past 720 hour(s))  Blood culture (routine x 2)     Status: None (Preliminary result)   Collection Time: 10/03/14 11:30 AM  Result Value Ref Range Status   Specimen Description BLOOD RIGHT ANTECUBITAL  Final   Special Requests BOTTLES DRAWN AEROBIC AND ANAEROBIC 6CC EACH  Final   Culture NO GROWTH < 24 HOURS  Final   Report Status PENDING  Incomplete  Blood culture (routine x 2)     Status: None (Preliminary result)   Collection Time: 10/03/14 12:10 PM  Result Value Ref Range Status   Specimen Description BLOOD LEFT ANTECUBITAL  Final   Special Requests BOTTLES DRAWN AEROBIC ONLY 8CC  Final   Culture NO GROWTH < 24 HOURS  Final   Report Status PENDING  Incomplete  Urine culture     Status: None (Preliminary result)   Collection Time: 10/03/14 12:10 PM  Result Value Ref Range Status   Specimen Description URINE, CATHETERIZED  Final   Special Requests NONE  Final   Culture   Final    NO GROWTH < 24 HOURS Performed at [facility]    Report Status PENDING  Incomplete  MRSA PCR Screening     Status: Abnormal   Collection Time: 10/03/14  5:27 PM  Result Value Ref Range Status   MRSA by PCR POSITIVE (A) NEGATIVE Final    Comment:        The GeneXpert MRSA Assay (FDA approved for NASAL specimens only), is one component of a comprehensive MRSA colonization surveillance program. It is not intended to diagnose MRSA infection nor to guide or monitor treatment for MRSA infections. RESULT CALLED TO, READ BACK BY AND VERIFIED WITH: Bethann Humble AT 2200 ON [id number] BY FORSYTH K     Medical History: Past Medical History  Diagnosis Date  . HTN (hypertension)   . Hyperlipidemia   . PVCs (premature ventricular contractions)   . Glaucoma   . PVD (peripheral vascular disease)     Right ABI .75, Left .78 (2006)  . Popliteal artery aneurysm, bilateral DECUMENTED CHRONIC PARTIAL OCCLUSION--  PT DENIES CLAUDICATION OR ANY OTHER SYMPTOMS  . Frequency of urination   . Nocturia   . Bladder cancer 03/15/2012  . Chronic systolic CHF (congestive heart failure)     a. NICM - patent cors 06/2012, EF 40% at that time. b. 03/2013 eval: EF 20-25%.  Marland Kitchen  DVT, bilateral lower limbs 03/2013    a. Bilat DVT dx 03/2013.  . Pulmonary embolism     a. By CT angio 04/2013.  Marland Kitchen Elevated troponin     a. Adm 12/30-04/2013 - not felt to represent ACS; ?due to PE. b. Patent cors 06/2012.  Marland Kitchen NSVT (nonsustained ventricular tachycardia)     a. NSVT 06/2012; NSVT also seen during 03/2013 adm. b. Med rx. Not candidate for ICD given adv age.  Marland Kitchen ETOH abuse   . Syncope     a. Felt to be postural syncope related to diuretics 06/2012.  Marland Kitchen Heart murmur   . DVT (deep venous thrombosis)     "I think in both legs"  . Pneumonia     "couple times"  . DDD (degenerative disc disease)   . Arthritis     "all over"  .  Chronic back pain   . Depression   . CAD- non obstructive disease by cath 3/14   . PAD (peripheral artery disease)     Medications:  Prescriptions prior to admission  Medication Sig Dispense Refill Last Dose  . donepezil (ARICEPT) 10 MG tablet TAKE ONE TABLET AT BEDTIME 30 tablet 2 Unknown  . fluticasone (FLONASE) 50 MCG/ACT nasal spray Place 2 sprays into both nostrils daily. 16 g 6 Unknown  . furosemide (LASIX) 40 MG tablet 40 MG ON MON, WED, AND FRI'S; THEN 20 MG ALL OTHER DAYS 30 tablet 5 Unknown  . gabapentin (NEURONTIN) 100 MG capsule TAKE (2) CAPSULES TWICE DAILY. 120 capsule 2 Unknown  . HORSE CHESTNUT PO Take 1 capsule by mouth daily.   Unknown  . HYDROcodone-acetaminophen (NORCO/VICODIN) 5-325 MG per tablet Take 1 tablet by mouth every 6 (six) hours as needed for moderate pain or severe pain. 60 tablet 0 10/03/2014 at Unknown time  . isosorbide mononitrate (IMDUR) 30 MG 24 hr tablet Take 0.5 tablets (15 mg total) by mouth daily. 30 tablet 5 Unknown  . lidocaine (XYLOCAINE) 2 % jelly APPLY AS NEEDED TO THE LEFT FOOD ULCER  2 Unknown  . losartan (COZAAR) 25 MG tablet Take 1 tablet (25 mg total) by mouth daily. 90 tablet 3 Unknown  . metoprolol succinate (TOPROL-XL) 50 MG 24 hr tablet TAKE (1) TABLET TWICE A DAY. 60 tablet 0 Unknown  . nitroGLYCERIN (NITROSTAT) 0.4 MG SL tablet Place 1 tablet (0.4 mg total) under the tongue every 5 (five) minutes as needed for chest pain (up to 3 doses). 25 tablet 3 Unknown  . potassium chloride SA (K-DUR,KLOR-CON) 20 MEQ tablet Take 1 tablet (20 mEq total) by mouth daily. 90 tablet 1 Unknown  . simvastatin (ZOCOR) 10 MG tablet TAKE 1 TABLET DAILY 30 tablet 4 Unknown  . spironolactone (ALDACTONE) 25 MG tablet TAKE 1 TABLET DAILY 30 tablet 3 unknown  . tamsulosin (FLOMAX) 0.4 MG CAPS capsule TAKE (1) CAPSULE DAILY 30 capsule 5 Unknown  . ULORIC 40 MG tablet TAKE 1 TABLET DAILY 30 tablet 4 Unknown  . warfarin (COUMADIN) 2.5 MG tablet TAKE UP TO 3 TABLETS  DAILY AS DIRECTED BY CPP 90 tablet 2 Unknown  . doxycycline (VIBRA-TABS) 100 MG tablet Take 1 tablet (100 mg total) by mouth 2 (two) times daily. With food (Patient not taking: Reported on 10/03/2014) 28 tablet 0 Taking  . meclizine (ANTIVERT) 32 MG tablet Take 1 tablet (32 mg total) by mouth 3 (three) times daily as needed. (Patient not taking: Reported on 10/03/2014) 30 tablet 0 Taking  . megestrol (MEGACE) 20 MG tablet TAKE  1 TABLET ONCE A DAY 30 tablet 3 Unknown  . mupirocin ointment (BACTROBAN) 2 % Apply small amt with qtip to wound after soaking TID (Patient not taking: Reported on 10/03/2014) 22 g 0 Taking   Assessment: 81 yo M with admitted with generalized weakness & pain at site of chronic LLE ulcer.   Foot Xray cannot exclude septic arthritis or osteomyelitis.  He was started on empiric, broad-spectrum antibiotics.  Surgical plan pending.  Renal function continues to improve with hydration.    Goal of Therapy:  Eradicate infection  Plan:  Continue Zosyn 3.375gm IV Q8h to be infused over 4hrs Increase Vancomycin 1250mg  IV q24h Check Vancomycin trough at steady state Monitor renal function and cx data   Biagio Borg 10/05/2014,8:41 AM

## 2014-10-05 NOTE — Progress Notes (Addendum)
Patient still has slight agitation after receiving as needed Ativan 0.5 mg by IV.  Patient heart rate g=having moments into the 130's at times with Ventricular Bigeminy and Trigeminy with PVC couplets.  12 Lead EKG showed NSR with 1 degree AV Heart Block with fusion complexes.  Anteroseptial infarct, age undetermined and LBBB.  MD made aware.

## 2014-10-05 NOTE — Progress Notes (Signed)
Family recommends an AKA if amputation is necessary per the vascular surgeon's recent report in [place], [place] per son and daughter-in-law.

## 2014-10-05 NOTE — Progress Notes (Signed)
[facility] PROGRESS NOTE  Scott [name] TXM:[id number] DOB: [birth year] DOA: 10/03/2014 PCP: Redge Gainer, MD    Code Status: DO NOT RESUSCITATE Family Communication: Discussion wife and son and caretaker on 6/24. Disposition Plan: Discharge when clinically appropriate   Consultants:  Cardiology   Procedures:  10/04/2014 - 2-D echocardiogram : Study conclusions- Left ventricle: Abnormal septal motion The cavity size was normal. There was mild concentric hypertrophy. Systolic function was normal. The estimated ejection fraction was in the range of 50% to 55%. Doppler parameters are consistent with abnormal left ventricular relaxation (grade 1 diastolic dysfunction). - Aortic valve: There was trivial regurgitation. - Mitral valve: Calcified annulus.  Antibiotics:  Vancomycin 6/23 >   Zosyn 6/23>   HPI/Subjective: Patient has had intermittent confusion, during the late night and early morning hours. He does not complain of foot pain, chest pain, or shortness of breath.  Objective: Filed Vitals:   10/05/14 0755  BP:   Pulse:   Temp: 97.5 F (36.4 C)  Resp:    temperature 97.5. Pulse 83 (95 on telemetry) blood pressure 158/81. Oxygen saturation 100% on nasal cannula oxygen.  Intake/Output Summary (Last 24 hours) at 10/05/14 0834 Last data filed at 10/05/14 0600  Gross per 24 hour  Intake   2246 ml  Output    325 ml  Net   1921 ml   Filed Weights   10/03/14 1730 10/04/14 0457 10/05/14 0500  Weight: 69.1 kg (152 lb 5.4 oz) 70.2 kg (154 lb 12.2 oz) 70.9 kg (156 lb 4.9 oz)    Exam:   General: Elderly African-American man who appears more confused and agitated.  Cardiovascular: S1, S2, with occasional ectopy  Respiratory: Upper airway wheezes and crackles; wet cough.  Abdomen: positive bowel sounds, soft, nontender, nondistended.  Musculoskeletal/extremities: Left lower extremity is grossly edematous at approximately 2+ with 3 plus edema of the  left foot; resolved erythema of the pretibial area of the left lower extremity; the left fifth toe is gangrenous and cold to touch and mildly tender-there is a deep fissure between the left fourth and fifth toes with no visible drainage currently; no palpable pulse on the left lower extremity. Right lower extremity without edema and with a palpable pulse.  Neurologic: The patient is alert, but confused. He is oriented to himself. He believes he is in [place], but when redirected, he realizes that he is in the hospital. His speech is clear.  Data Reviewed: Basic Metabolic Panel:  Recent Labs Lab 10/03/14 1129 10/03/14 1808 10/04/14 0450 10/04/14 1846 10/05/14 0520  NA 134* 140 139  --  139  K 6.0* 5.6* 4.9  --  4.1  CL 101 106 106  --  106  CO2 26 27 23   --  26  GLUCOSE 118* 105* 101*  --  94  BUN 74* 65* 54*  --  29*  CREATININE 2.83* 2.17* 1.74*  --  1.16  CALCIUM 9.7 9.6 8.9  --  8.7*  MG  --   --   --  1.9  --    Liver Function Tests:  Recent Labs Lab 10/04/14 0450  AST 32  ALT 26  ALKPHOS 112  BILITOT 0.7  PROT 6.7  ALBUMIN 3.3*   No results for input(s): LIPASE, AMYLASE in the last 168 hours. No results for input(s): AMMONIA in the last 168 hours. CBC:  Recent Labs Lab 10/03/14 1129 10/05/14 0520  WBC 7.6 8.5  NEUTROABS 6.1  --   HGB 12.2* 10.3*  HCT 37.1* 31.5*  MCV 83.4 83.8  PLT 262 265   Cardiac Enzymes: No results for input(s): CKTOTAL, CKMB, CKMBINDEX, TROPONINI in the last 168 hours. BNP (last 3 results)  Recent Labs  03/26/14 1511  BNP 137.5*    ProBNP (last 3 results) No results for input(s): PROBNP in the last 8760 hours.  CBG: No results for input(s): GLUCAP in the last 168 hours.  Recent Results (from the past 240 hour(s))  Blood culture (routine x 2)     Status: None (Preliminary result)   Collection Time: 10/03/14 11:30 AM  Result Value Ref Range Status   Specimen Description BLOOD RIGHT ANTECUBITAL  Final   Special Requests  BOTTLES DRAWN AEROBIC AND ANAEROBIC 6CC EACH  Final   Culture NO GROWTH < 24 HOURS  Final   Report Status PENDING  Incomplete  Blood culture (routine x 2)     Status: None (Preliminary result)   Collection Time: 10/03/14 12:10 PM  Result Value Ref Range Status   Specimen Description BLOOD LEFT ANTECUBITAL  Final   Special Requests BOTTLES DRAWN AEROBIC ONLY 8CC  Final   Culture NO GROWTH < 24 HOURS  Final   Report Status PENDING  Incomplete  Urine culture     Status: None (Preliminary result)   Collection Time: 10/03/14 12:10 PM  Result Value Ref Range Status   Specimen Description URINE, CATHETERIZED  Final   Special Requests NONE  Final   Culture   Final    NO GROWTH < 24 HOURS Performed at [facility]    Report Status PENDING  Incomplete  MRSA PCR Screening     Status: Abnormal   Collection Time: 10/03/14  5:27 PM  Result Value Ref Range Status   MRSA by PCR POSITIVE (A) NEGATIVE Final    Comment:        The GeneXpert MRSA Assay (FDA approved for NASAL specimens only), is one component of a comprehensive MRSA colonization surveillance program. It is not intended to diagnose MRSA infection nor to guide or monitor treatment for MRSA infections. RESULT CALLED TO, READ BACK BY AND VERIFIED WITH: Bethann Humble AT 2200 ON [id number] BY FORSYTH K      Studies: Dg Chest 1 View  10/03/2014   CLINICAL DATA:  Chest congestion and shortness of breath. The patient reports declining health for 3 months.  EXAM: CHEST  1 VIEW  COMPARISON:  PA and lateral chest 03/26/2014.  FINDINGS: The lungs are clear. Heart size is upper normal. No pneumothorax or pleural effusion. No focal bony abnormality.  IMPRESSION: No acute disease.   Electronically Signed   By: Inge Rise M.D.   On: 10/03/2014 17:36   Ct Head Wo Contrast  10/03/2014   CLINICAL DATA:  Altered mental status decline for 3 months.  EXAM: CT HEAD WITHOUT CONTRAST  TECHNIQUE: Contiguous axial images were obtained from the base  of the skull through the vertex without intravenous contrast.  COMPARISON:  CT 07/10/2013  FINDINGS: No acute intracranial hemorrhage. No focal mass lesion. No CT evidence of acute infarction. No midline shift or mass effect. No hydrocephalus. Basilar cisterns are patent.  Remote infarction in the left centrum semiovale. There is generalized cortical atrophy. Partial ventricular dilatation. Mild periventricular white matter hypodensities.  13 mm round lesion in the sella which have has low-density is not changed from prior.  IMPRESSION: 1. No acute intracranial findings. 2. Chronic atrophy and white matter microvascular disease. 3. 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 4. Stable low-density cystic lesion in the sella   Electronically Signed   By: Suzy Bouchard M.D.   On: 10/03/2014 14:16   Dg Toe 5th Left  10/03/2014   CLINICAL DATA:  One month history of persistent skin ulceration. Pain and swelling.  EXAM: DG TOE 5TH LEFT  COMPARISON:  09/12/2014  FINDINGS: Knee PIP joint appears slightly narrowed compared to the prior study. There is air surrounding the joint and there is slight cortical irregularity along the medial aspect of the distal aspect of the proximal phalanx. Could not exclude septic arthritis and osteomyelitis. MRI is recommended for further evaluation.  IMPRESSION: Diffuse soft tissue swelling involving the fifth toe with air in the soft tissues.  Could not exclude septic arthritis and osteomyelitis centered at the PIP joint of the fifth toe. MRI without and with contrast is recommended for further evaluation.   Electronically Signed   By: Marijo Sanes M.D.   On: 10/03/2014 12:53    Scheduled Meds: . Chlorhexidine Gluconate Cloth  6 each Topical Q0600  . donepezil  10 mg Oral QHS  . febuxostat  40 mg Oral Daily  . fluticasone  2 spray Each Nare Daily  . folic acid  1 mg Oral Daily  . furosemide  20 mg Intravenous Once  . gabapentin  200 mg Oral BID  . isosorbide  mononitrate  15 mg Oral Daily  . metoprolol succinate  25 mg Oral BID  . multivitamin with minerals  1 tablet Oral Daily  . mupirocin ointment  1 application Nasal BID  . piperacillin-tazobactam (ZOSYN)  IV  3.375 g Intravenous Q8H  . simvastatin  10 mg Oral Daily  . tamsulosin  0.4 mg Oral QPC breakfast  . thiamine  100 mg Oral Daily   Or  . thiamine  100 mg Intravenous Daily  . vancomycin  750 mg Intravenous Q24H   Continuous Infusions: . sodium chloride 70 mL/hr at 10/05/14 0310    Assessment and plan:  Principal Problem:   Gangrene of toe Active Problems:   Sepsis due to other etiology   Hypothermia   Ischemic ulcer of left foot   Atherosclerotic PVD with ulceration   Cellulitis of left leg   Chronic systolic heart failure   NICM (nonischemic cardiomyopathy)- EF 20-25% by Echo 04/12/13   UTI (lower urinary tract infection)   Warfarin-induced coagulopathy   Acute kidney injury   Hyperkalemia   Tachyarrhythmia  Gangrene of the left fifth toe, in the setting of atherosclerotic PVD with ulceration between the left fourth and fifth toes/ischemic ulcer, and mild cellulitis of the left leg.  The patient was recently evaluated by vascular surgeon, Dr. Kellie Simmering on 09/24/14. He recommended to the patient and the patient's family a left above-the-knee amputation. He did not believe the patient was a candidate for open bypass procedure given his extensive occlusion of the superficial femoral popliteal and tibial vessels-angioplasty and stenting would not be successful, particularly in the setting of recent renal insufficiency. On admission, the x-ray of his foot revealed diffuse soft tissue swelling involving the fifth toe with air in the soft tissues, consistent with gangrene. Septic arthritis or osteomyelitis cannot be ruled out.The patient has been started on vancomycin and Zosyn. Blood cultures are negative to date. Will hold on ordering an MRI as I don't believe it will change the  management. - Ultimately, the patient needs an amputation as stated by Dr. Kellie Simmering. Apparently, he discussed this with the patient's son and daughter-in-law who  is an orthopedic surgeon in [place] on 09/24/14. The patient will obviously be a high risk for surgery. -Updated the patient's son Scott [name] regarding his progress on 6/24. Scott [name] prefers that we continue to treat the patient medically and he will decide on a referral back to Dr. Kellie Simmering following discharge. I did give him the option of transferring him to [facility] if an urgent amputation is needed. However, he was informed that the patient was improving clinically and symptomatically. Patient has had no complaints of foot pain. -We'll continue current management and will narrow antibiotics in the next 24 hours.  Possible UTI.  Will continue antibiotics as above. Urine culture is negative today. Sepsis and mild lactic acidosis likely secondary to left fifth toe gangrene and UTI.  The patient's lactic acid was initially 1.3, but increased to 3.2; then decrease to 2.4. . Blood cultures and a urine culture results are negative to date. Clinically, sepsis has resolved. We'll continue antibiotics as stated above. Hypothermia, secondary to sepsis.  His temperature on admission was 96.6. It has improved and normalized. Acute kidney injury.  One year ago, the patient's creatinine was 1.1-1.5.  On admission, it is 2.83. With hydration, treatment of infections, it has improved and normalized. Lasix, losartan, and spironolactone were initially held and he was hydrated with IV fluids. He has some chest congestion, so will give him one IV dose of Lasix and KVO his IV fluids today.   Etiology likely prerenal or possibly ATN from the infection and hypovolemia and/or from chronic ARB treatment.  Hyperkalemia.  Patient's serum potassium was 6.0 on admission. He was treated with bicarbonate, Kayexalate, and calcium carbonate. His serum potassium  normalized. Bicarbonate was discontinued and IV fluids. Etiology was likely secondary to outpatient potassium supplementation, spironolactone, and losartan. Warfarin-induced coagulopathy. The patient is treated with Coumadin chronically for his history of bilateral DVT and PE. His INR was 7.27 on admission. It has increased to 9.29. Although there were no sign of bleeding, I choose to give him 2.5 mg of vitamin K subcutaneous empirically. His INR is now below 4. Will ask the pharmacist to monitor and dose to Coumadin daily. Tachyarrhythmia. It was thought that the patient had PAF with rapid ventricular response versus wide complex tachycardia. He has a history of nonsustained V. tach and frequent PVCs. He was deemed not to be an ICD candidate by cardiology in the past. IV metoprolol and subsequently oral Toprol XL was restarted. He was given IV fluid hydration. All of these measures helped as his heart rate has decreased some. TSH is pending. Cardiology was consulted. Per cardiologist, Dr. Haroldine Laws, the patient has baseline left bundle branch block with runs of NSVT. He agreed with restarting Toprol-XL with titration up to 50 mg twice a day, the patient's home dose. He recommended consideration of amiodarone if the ectopy increases. -Patient's heart rate and blood pressure have improved. Will consider increasing dosing of Toprol-XL. Nonischemic cardiomyopathy/chronic systolic heart failure.  Patient's 2-D echocardiogram 04/2013 revealed an ejection fraction of 20-25%. He is treated chronically with losartan, Lasix, spironolactone, and metoprolol. These were held on admission secondary to above. Follow-up 2-D echocardiogram on 6/24 revealed an EF of 50-55% and grade 1 diastolic dysfunction. On admission, he appeared to be volume depleted, so he was started on IV fluid hydration. His chest x-ray on admission revealed no acute findings.  -She does have some pulmonary crackles on exam today. Therefore, will  KVO the IV fluids, give 20 g of IV Lasix now  and order a chest x-ray for further evaluation.  Acute encephalopathy/agitation. The patient has been intermittently agitated, mostly at night and during the early morning hours, which may be secondary to sundowning. He is treated with Aricept, so he likely has mild dementia. However, cardiology nurse practitioner, Ms. 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 The patient and his wife was questioned about this and acknowledged that he had drunk alcohol in the past, but not in one month. Nevertheless, vitamin therapy and as needed Ativan have been ordered for questionable superimposed alcohol withdrawal, though this is not completely clear. -We'll discuss this further with his son when available.  Time spent: 40 minutes.    [facility] Pager [phone]. If 7PM-7AM, please contact night-coverage at [email or web address], password [facility] 10/05/2014, 8:34 AM  LOS: 2 days

## 2014-10-06 DIAGNOSIS — J690 Pneumonitis due to inhalation of food and vomit: Secondary | ICD-10-CM

## 2014-10-06 LAB — BASIC METABOLIC PANEL
Anion gap: 8 (ref 5–15)
BUN: 21 mg/dL — ABNORMAL HIGH (ref 6–20)
CO2: 24 mmol/L (ref 22–32)
Calcium: 8.7 mg/dL — ABNORMAL LOW (ref 8.9–10.3)
Chloride: 109 mmol/L (ref 101–111)
Creatinine, Ser: 1.1 mg/dL (ref 0.61–1.24)
GFR calc Af Amer: >60 mL/min (ref >60)
GFR calc non Af Amer: 57 mL/min — ABNORMAL LOW (ref >60)
Glucose, Bld: 87 mg/dL (ref 65–99)
Potassium: 4.4 mmol/L (ref 3.5–5.1)
Sodium: 141 mmol/L (ref 135–145)

## 2014-10-06 LAB — CBC
HCT: 30 % — ABNORMAL LOW (ref 39.0–52.0)
Hemoglobin: 9.7 g/dL — ABNORMAL LOW (ref 13.0–17.0)
MCH: 27.4 pg (ref 26.0–34.0)
MCHC: 32.3 g/dL (ref 30.0–36.0)
MCV: 84.7 fL (ref 78.0–100.0)
Platelets: 249 10*3/uL (ref 150–400)
RBC: 3.54 MIL/uL — ABNORMAL LOW (ref 4.22–5.81)
RDW: 14.5 % (ref 11.5–15.5)
WBC: 9.6 10*3/uL (ref 4.0–10.5)

## 2014-10-06 LAB — PROTIME-INR
INR: 2.16 — ABNORMAL HIGH (ref 0.00–1.49)
Prothrombin Time: 23.9 seconds — ABNORMAL HIGH (ref 11.6–15.2)

## 2014-10-06 MED ORDER — FUROSEMIDE 20 MG PO TABS
20.0000 mg | ORAL_TABLET | Freq: Every day | ORAL | Status: DC
  Administered 2014-10-06 – 2014-10-08 (×3): 20 mg via ORAL
  Filled 2014-10-06 (×3): qty 1

## 2014-10-06 MED ORDER — WARFARIN SODIUM 5 MG PO TABS
7.5000 mg | ORAL_TABLET | Freq: Once | ORAL | Status: AC
  Administered 2014-10-06: 7.5 mg via ORAL
  Filled 2014-10-06: qty 2

## 2014-10-06 NOTE — Progress Notes (Signed)
[place] for Coumadin Indication: atrial fibrillation, hx VTE  Allergies  Allergen Reactions  . Lipitor [Atorvastatin] Other (See Comments)    unknown    Patient Measurements: Height: 5\' 10"  (177.8 cm) Weight: 156 lb 4.9 oz (70.9 kg) IBW/kg (Calculated) : 73  Vital Signs: Temp: 97.9 F (36.6 C) (06/26 0400) Temp Source: Axillary (06/26 0400) BP: 115/74 mmHg (06/26 0800) Pulse Rate: 68 (06/26 0800)  Labs:  Recent Labs  10/03/14 1129  10/04/14 0450 10/05/14 0520 10/06/14 0512  HGB 12.2*  --   --  10.3* 9.7*  HCT 37.1*  --   --  31.5* 30.0*  PLT 262  --   --  265 249  LABPROT  --   < > 71.6* 36.0* 23.9*  INR  --   < > 9.29* 3.72* 2.16*  CREATININE 2.83*  < > 1.74* 1.16 1.10  < > = values in this interval not displayed.  Estimated Creatinine Clearance: 43.9 mL/min (by C-G formula based on Cr of 1.1).   Medical History: Past Medical History  Diagnosis Date  . HTN (hypertension)   . Hyperlipidemia   . PVCs (premature ventricular contractions)   . Glaucoma   . PVD (peripheral vascular disease)     Right ABI .75, Left .78 (2006)  . Popliteal artery aneurysm, bilateral DECUMENTED CHRONIC PARTIAL OCCLUSION--  PT DENIES CLAUDICATION OR ANY OTHER SYMPTOMS  . Frequency of urination   . Nocturia   . Bladder cancer 03/15/2012  . Chronic systolic CHF (congestive heart failure)     a. NICM - patent cors 06/2012, EF 40% at that time. b. 03/2013 eval: EF 20-25%.  . DVT, bilateral lower limbs 03/2013    a. Bilat DVT dx 03/2013.  . Pulmonary embolism     a. By CT angio 04/2013.  Marland Kitchen Elevated troponin     a. Adm 12/30-04/2013 - not felt to represent ACS; ?due to PE. b. Patent cors 06/2012.  Marland Kitchen NSVT (nonsustained ventricular tachycardia)     a. NSVT 06/2012; NSVT also seen during 03/2013 adm. b. Med rx. Not candidate for ICD given adv age.  Marland Kitchen ETOH abuse   . Syncope     a. Felt to be postural syncope related to diuretics 06/2012.  Marland Kitchen Heart murmur    . DVT (deep venous thrombosis)     "I think in both legs"  . Pneumonia     "couple times"  . DDD (degenerative disc disease)   . Arthritis     "all over"  . Chronic back pain   . Depression   . CAD- non obstructive disease by cath 3/14   . PAD (peripheral artery disease)     Medications:  Prescriptions prior to admission  Medication Sig Dispense Refill Last Dose  . donepezil (ARICEPT) 10 MG tablet TAKE ONE TABLET AT BEDTIME 30 tablet 2 Unknown  . fluticasone (FLONASE) 50 MCG/ACT nasal spray Place 2 sprays into both nostrils daily. 16 g 6 Unknown  . furosemide (LASIX) 40 MG tablet 40 MG ON MON, WED, AND FRI'S; THEN 20 MG ALL OTHER DAYS 30 tablet 5 Unknown  . gabapentin (NEURONTIN) 100 MG capsule TAKE (2) CAPSULES TWICE DAILY. 120 capsule 2 Unknown  . HORSE CHESTNUT PO Take 1 capsule by mouth daily.   Unknown  . HYDROcodone-acetaminophen (NORCO/VICODIN) 5-325 MG per tablet Take 1 tablet by mouth every 6 (six) hours as needed for moderate pain or severe pain. 60 tablet 0 10/03/2014 at Unknown time  .  isosorbide mononitrate (IMDUR) 30 MG 24 hr tablet Take 0.5 tablets (15 mg total) by mouth daily. 30 tablet 5 Unknown  . lidocaine (XYLOCAINE) 2 % jelly APPLY AS NEEDED TO THE LEFT FOOD ULCER  2 Unknown  . losartan (COZAAR) 25 MG tablet Take 1 tablet (25 mg total) by mouth daily. 90 tablet 3 Unknown  . metoprolol succinate (TOPROL-XL) 50 MG 24 hr tablet TAKE (1) TABLET TWICE A DAY. 60 tablet 0 Unknown  . nitroGLYCERIN (NITROSTAT) 0.4 MG SL tablet Place 1 tablet (0.4 mg total) under the tongue every 5 (five) minutes as needed for chest pain (up to 3 doses). 25 tablet 3 Unknown  . potassium chloride SA (K-DUR,KLOR-CON) 20 MEQ tablet Take 1 tablet (20 mEq total) by mouth daily. 90 tablet 1 Unknown  . simvastatin (ZOCOR) 10 MG tablet TAKE 1 TABLET DAILY 30 tablet 4 Unknown  . spironolactone (ALDACTONE) 25 MG tablet TAKE 1 TABLET DAILY 30 tablet 3 unknown  . tamsulosin (FLOMAX) 0.4 MG CAPS capsule  TAKE (1) CAPSULE DAILY 30 capsule 5 Unknown  . ULORIC 40 MG tablet TAKE 1 TABLET DAILY 30 tablet 4 Unknown  . warfarin (COUMADIN) 2.5 MG tablet TAKE UP TO 3 TABLETS DAILY AS DIRECTED BY CPP 90 tablet 2 Unknown  . doxycycline (VIBRA-TABS) 100 MG tablet Take 1 tablet (100 mg total) by mouth 2 (two) times daily. With food (Patient not taking: Reported on 10/03/2014) 28 tablet 0 Taking  . meclizine (ANTIVERT) 32 MG tablet Take 1 tablet (32 mg total) by mouth 3 (three) times daily as needed. (Patient not taking: Reported on 10/03/2014) 30 tablet 0 Taking  . megestrol (MEGACE) 20 MG tablet TAKE 1 TABLET ONCE A DAY 30 tablet 3 Unknown  . mupirocin ointment (BACTROBAN) 2 % Apply small amt with qtip to wound after soaking TID (Patient not taking: Reported on 10/03/2014) 22 g 0 Taking    Assessment: 81 yo M on chronic Coumadin PTA for hx Afib & VTE.  INR supra-therapeutic on admission.  He was given 3mg  Vit K sq x1 on 6/24.  INR within goal range today.  No bleeding noted.  Her outpatient [facility] flowsheet patient's Coumadin was decreased to 5mg  daily except 7.5mg  on Mon & Fri ~ 1 month ago.    Goal of Therapy:  INR 2-3   Plan:  Coumadin 7.5mg  po x1 today Daily INR Consider d/c home on Coumadin 5mg  po daily with outpatient follow-up  Scott [name], Lavonia Drafts 10/06/2014,8:57 AM

## 2014-10-06 NOTE — Progress Notes (Signed)
Patient transferred to 314 in stable condition via bed with RN. This RN waited in room with patient until nurse was able to receive patient and sitter was present in patient room. Report given to R. Harlow Mares, RN. Son and spouse notified of patient transfer.

## 2014-10-06 NOTE — Progress Notes (Signed)
[facility] PROGRESS NOTE  Scott [name] OFH:[id number] DOB: Mar 07, 1922 DOA: 10/03/2014 PCP: Redge Gainer, MD    Code Status: DO NOT RESUSCITATE Family Communication: Discussion with son and daughter-in-law, Dr. Wynetta Emery on 6/25 Disposition Plan: Discharge when clinically appropriate, hopefully in the next couple days.   Consultants:  Cardiology   Procedures:  10/04/2014 - 2-D echocardiogram : Study conclusions- Left ventricle: Abnormal septal motion The cavity size was normal. There was mild concentric hypertrophy. Systolic function was normal. The estimated ejection fraction was in the range of 50% to 55%. Doppler parameters are consistent with abnormal left ventricular relaxation (grade 1 diastolic dysfunction). - Aortic valve: There was trivial regurgitation. - Mitral valve: Calcified annulus.  Antibiotics:  Vancomycin 6/23 >   Zosyn 6/23>   HPI/Subjective: Patient was given 0.5 mg of lorazepam at approximately 5 AM this morning, so he is a little groggy. When aroused, he is oriented to himself and denies chest pain, shortness of breath, or left leg pain.  Objective: Filed Vitals:   10/06/14 0800  BP: 115/74  Pulse: 68  Temp:   Resp: 18   temperature 97.9. Oxygen saturation 100% on room air.  Intake/Output Summary (Last 24 hours) at 10/06/14 0845 Last data filed at 10/06/14 0000  Gross per 24 hour  Intake    788 ml  Output      0 ml  Net    788 ml   Filed Weights   10/04/14 0457 10/05/14 0500 10/06/14 0500  Weight: 70.2 kg (154 lb 12.2 oz) 70.9 kg (156 lb 4.9 oz) 70.9 kg (156 lb 4.9 oz)    Exam:   General: Elderly African-American man who is slightly sedated, in no acute distress.  Cardiovascular: S1, S2, with occasional ectopy  Respiratory: Lungs clear anteriorly with decreased breath sounds in the bases; resolution of pulmonary crackles.  Abdomen: positive bowel sounds, soft, nontender, nondistended.  Musculoskeletal/extremities:  Left lower extremity with significantly less edema at approximately 1+ and 1+ to 2+ left pedal edema; resolved erythema of the pretibial area of the left lower extremity; the left fifth toe is gangrenous and cool to touch; there is a deep fissure between the left fourth and fifth toes with no visible drainage currently; no palpable pulse on the left lower extremity. Right lower extremity without edema and with a palpable pulse.  Neurologic: The patient is mildly sedated, but oriented to himself in the hospital. His speech is clear.  Data Reviewed: Basic Metabolic Panel:  Recent Labs Lab 10/03/14 1129 10/03/14 1808 10/04/14 0450 10/04/14 1846 10/05/14 0520 10/06/14 0512  NA 134* 140 139  --  139 141  K 6.0* 5.6* 4.9  --  4.1 4.4  CL 101 106 106  --  106 109  CO2 26 27 23   --  26 24  GLUCOSE 118* 105* 101*  --  94 87  BUN 74* 65* 54*  --  29* 21*  CREATININE 2.83* 2.17* 1.74*  --  1.16 1.10  CALCIUM 9.7 9.6 8.9  --  8.7* 8.7*  MG  --   --   --  1.9  --   --    Liver Function Tests:  Recent Labs Lab 10/04/14 0450  AST 32  ALT 26  ALKPHOS 112  BILITOT 0.7  PROT 6.7  ALBUMIN 3.3*   No results for input(s): LIPASE, AMYLASE in the last 168 hours. No results for input(s): AMMONIA in the last 168 hours. CBC:  Recent Labs Lab 10/03/14 1129 10/05/14 0520 10/06/14  0512  WBC 7.6 8.5 9.6  NEUTROABS 6.1  --   --   HGB 12.2* 10.3* 9.7*  HCT 37.1* 31.5* 30.0*  MCV 83.4 83.8 84.7  PLT 262 265 249   Cardiac Enzymes: No results for input(s): CKTOTAL, CKMB, CKMBINDEX, TROPONINI in the last 168 hours. BNP (last 3 results)  Recent Labs  03/26/14 1511  BNP 137.5*    ProBNP (last 3 results) No results for input(s): PROBNP in the last 8760 hours.  CBG: No results for input(s): GLUCAP in the last 168 hours.  Recent Results (from the past 240 hour(s))  Blood culture (routine x 2)     Status: None (Preliminary result)   Collection Time: 10/03/14 11:30 AM  Result Value Ref  Range Status   Specimen Description BLOOD RIGHT ANTECUBITAL  Final   Special Requests BOTTLES DRAWN AEROBIC AND ANAEROBIC 6CC EACH  Final   Culture NO GROWTH < 24 HOURS  Final   Report Status PENDING  Incomplete  Blood culture (routine x 2)     Status: None (Preliminary result)   Collection Time: 10/03/14 12:10 PM  Result Value Ref Range Status   Specimen Description BLOOD LEFT ANTECUBITAL  Final   Special Requests BOTTLES DRAWN AEROBIC ONLY 8CC  Final   Culture NO GROWTH < 24 HOURS  Final   Report Status PENDING  Incomplete  Urine culture     Status: None   Collection Time: 10/03/14 12:10 PM  Result Value Ref Range Status   Specimen Description URINE, CATHETERIZED  Final   Special Requests NONE  Final   Culture   Final    NO GROWTH 2 DAYS Performed at [facility]    Report Status 10/05/2014 FINAL  Final  MRSA PCR Screening     Status: Abnormal   Collection Time: 10/03/14  5:27 PM  Result Value Ref Range Status   MRSA by PCR POSITIVE (A) NEGATIVE Final    Comment:        The GeneXpert MRSA Assay (FDA approved for NASAL specimens only), is one component of a comprehensive MRSA colonization surveillance program. It is not intended to diagnose MRSA infection nor to guide or monitor treatment for MRSA infections. RESULT CALLED TO, READ BACK BY AND VERIFIED WITH: Bethann Humble AT 2200 ON [id number] BY FORSYTH K      Studies: Dg Chest [facility] 1 View  10/05/2014   CLINICAL DATA:  Shortness of breath, cough, congestion, wheezing.  EXAM: PORTABLE CHEST - 1 VIEW  COMPARISON:  10/03/2014; 03/26/2014; 03/14/2013 ; chest CT- 04/10/2013  FINDINGS: Grossly unchanged enlarged cardiac silhouette and mediastinal contours given decreased lung volumes and patient rotation. Atherosclerotic plaque within thoracic aorta. There is persistent thickening of the right paratracheal stripe secondary to prominent vasculature as demonstrated on prior chest CT performed 03/2013. Pulmonary venous congestion  without frank evidence of edema. Worsening bibasilar and right perihilar opacities, right greater than left. No definite pleural effusion or pneumothorax. Unchanged bones.  IMPRESSION: Worsening right perihilar and bibasilar opacities, right greater than left, possibly atelectasis fell aspiration and/or infection could have a similar appearance. Further evaluation with a PA and lateral chest radiograph may be obtained as clinically indicated.   Electronically Signed   By: Sandi Mariscal M.D.   On: 10/05/2014 08:59    Scheduled Meds: . Chlorhexidine Gluconate Cloth  6 each Topical Q0600  . donepezil  10 mg Oral QHS  . febuxostat  40 mg Oral Daily  . feeding supplement (ENSURE ENLIVE)  237 mL  Oral BID BM  . fluticasone  2 spray Each Nare Daily  . folic acid  1 mg Oral Daily  . gabapentin  100 mg Oral BID  . isosorbide mononitrate  15 mg Oral Daily  . levalbuterol  0.63 mg Nebulization Q8H  . metoprolol succinate  25 mg Oral BID  . multivitamin with minerals  1 tablet Oral Daily  . mupirocin ointment  1 application Nasal BID  . piperacillin-tazobactam (ZOSYN)  IV  3.375 g Intravenous Q8H  . simvastatin  10 mg Oral Daily  . tamsulosin  0.4 mg Oral QPC breakfast  . thiamine  100 mg Oral Daily   Or  . thiamine  100 mg Intravenous Daily  . vancomycin  1,250 mg Intravenous Q24H  . Warfarin - Pharmacist Dosing Inpatient   Does not apply Q24H   Continuous Infusions: . sodium chloride 10 mL/hr at 10/05/14 0848    Assessment and plan:  Principal Problem:   Gangrene of toe Active Problems:   Sepsis due to other etiology   Hypothermia   Ischemic ulcer of left foot   Atherosclerotic PVD with ulceration   Cellulitis of left leg   Aspiration pneumonia   Chronic systolic heart failure   UTI (lower urinary tract infection)   Warfarin-induced coagulopathy   Acute kidney injury   Hyperkalemia   Tachyarrhythmia  Gangrene of the left fifth toe, in the setting of atherosclerotic PVD with  ulceration between the left fourth and fifth toes/ischemic ulcer, and mild cellulitis of the left leg.  The patient was recently evaluated by vascular surgeon, Dr. Kellie Simmering on 09/24/14. He recommended to the patient and the patient's family a left above-the-knee amputation. He did not believe the patient was a candidate for open bypass procedure given his extensive occlusion of the superficial femoral popliteal and tibial vessels-angioplasty and stenting would not be successful, particularly in the setting of recent renal insufficiency. On admission, the x-ray of his foot revealed diffuse soft tissue swelling involving the fifth toe with air in the soft tissues, consistent with gangrene. Septic arthritis or osteomyelitis cannot be ruled out.The patient has been started on vancomycin and Zosyn. Blood cultures are negative to date. MRI of his foot was not ordered as it would not change the management. - Ultimately, the patient needs an amputation as stated by Dr. Kellie Simmering. Apparently, he discussed this with the patient's son and daughter-in-law who is an Doctor, general practice in [place] on 09/24/14. The patient will obviously be a high risk for surgery. -Updated the patient's son [name] and daughter-in-law, Dr. Wynetta Emery regarding his progress on 6/25. [name] prefers that we continue to treat the patient medically and he will decide on a referral back to Dr. Kellie Simmering following discharge. I did give him the option of transferring him to [facility] if an urgent amputation is needed. However, he was informed that the patient was improving clinically and symptomatically.  -Patient has had no complaints of foot pain. The erythema in the left leg has resolved and there is significantly less edema and bogginess of his foot. Gangrenous left fifth toe remains. -In light of the aspiration pneumonia, would favor keeping him on IV antibiotic for an additional 24-48 hours before narrowing. Aspiration pneumonia. Patient was noted  to have pulmonary crackles on exam on 6/25. Follow-up chest x-ray revealed worsening right perihilar and bibasal or opacities right greater than left. Given the patient's altered mental status and possible emesis prior to admission, it is likely that he has aspirated and the  infiltrate fluffed out with hydration. His diet has been downgraded to dysphagia 2 with nectar liquids. Will consult speech therapy for an official evaluation and management recommendations. -We'll continue IV antibiotic. Have added Xopenex to help with bronchodilation. Possible UTI.  Will continue antibiotics as above. Urine culture is negative today. Sepsis and mild lactic acidosis likely secondary to left fifth toe gangrene and UTI.  The patient's lactic acid was initially 1.3, but increased to 3.2; then decreased to 2.4. . Blood cultures and a urine culture results are negative to date. Clinically, sepsis has resolved. We'll continue antibiotics as stated above. Hypothermia, secondary to sepsis.  His temperature on admission was 96.6. It has improved and normalized. Acute kidney injury.  One year ago, the patient's creatinine was 1.1-1.5.  On admission, it is 2.83. With hydration, treatment of infections, it has improved and normalized. Lasix, losartan, and spironolactone were initially held and he was hydrated with IV fluids. He had some chest congestion on 6/25, so he was given one IV dose of Lasix and his fluids were decreased. His creatinine has improved progressively and has normalized.   Etiology likely prerenal or possibly ATN from the infection and hypovolemia and/or from chronic ARB treatment.  -We'll continue to hold spironolactone and losartan. Will restart lower dose of Lasix if his blood pressure tolerates it. Hyperkalemia.  Patient's serum potassium was 6.0 on admission. He was treated with bicarbonate, Kayexalate, and calcium carbonate. His serum potassium has normalized. Bicarbonate was discontinued in the  IV fluids. Etiology was likely secondary to outpatient potassium supplementation, spironolactone, and losartan. Warfarin-induced coagulopathy. The patient is treated with Coumadin chronically for his history of bilateral DVT and PE. His INR was 7.27 on admission. It had increased to 9.29. Although there were no sign of bleeding, I choose to give him 2.5 mg of vitamin K subcutaneous empirically. His INR is now below 3. Pharmacy has been consulted to monitor and to dose Coumadin daily. Tachyarrhythmia. It was thought that the patient had PAF with rapid ventricular response versus wide complex tachycardia. He has a history of nonsustained V. tach and frequent PVCs. He was deemed not to be an ICD candidate by cardiology in the past. As needed IV metoprolol and subsequently oral Toprol XL was restarted. He was given IV fluid hydration. All of these measures helped as his heart rate has decreased. TSH is within normal limits. Cardiology was consulted. Per cardiologist, Dr. Haroldine Laws, the patient has baseline left bundle branch block with runs of NSVT. He agreed with restarting Toprol-XL with titration up to 50 mg twice a day, the patient's home dose as tolerated. He recommended consideration of amiodarone if the ectopy increases. -Patient's heart rate and blood pressure have improved. His blood pressures a little soft, so will hold off on increasing Toprol-XL dosing. Nonischemic cardiomyopathy/chronic systolic heart failure.   He is treated chronically with losartan, Lasix, spironolactone, and metoprolol. These were held on admission secondary to above. Follow-up 2-D echocardiogram on 6/24 revealed an EF of 50-55% and grade 1 diastolic dysfunction. This is compared to ejection fraction 20-25% per echo 04/2013. On admission, he appeared to be volume depleted, so he was started on IV fluid hydration. His chest x-ray on admission revealed no acute findings. Follow-up chest x-ray on 6/25 revealed right greater than  left perihilar and bibasilar opacities, which are thought to be secondary to aspiration pneumonia. He was given one 20 mg IV dose of Lasix and started on Xopenex. His lung exam is better. -Would favor restarting  a smaller dose of Lasix daily at 20 mg daily. Acute encephalopathy/agitation. The patient has been intermittently agitated, mostly at night and during the early morning hours, which may be secondary to sundowning. He is treated with Aricept, so he likely has mild dementia. However, cardiology nurse practitioner, Ms. 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 The patient and his wife was questioned about this and acknowledged that he had drunk alcohol in the past, but not in one month. Nevertheless, vitamin therapy and as needed Ativan have been ordered for questionable superimposed alcohol withdrawal, though this is not completely clear versus sundowning. TSH and vitamin B12 were assessed and both were within normal limits.  Normocytic anemia.  Patient's hemoglobin was 12.2 on admission. It has trended downward to 9.7. This decreases thought to be secondary to hemodilution. There've been no signs of GI or GU bleeding. He is on Coumadin chronically, so will add Pepcid once daily. We'll continue to monitor.  Disposition We'll transfer the patient out of the stepdown unit. Will order PT consultation with nonweightbearing on the left.   Time spent: 30 minutes.    [facility] Pager [phone]. If 7PM-7AM, please contact night-coverage at [email or web address], password [facility] 10/06/2014, 8:45 AM  LOS: 3 days

## 2014-10-06 NOTE — Progress Notes (Signed)
Pt refusing medications tonight. Pt spit out medications.. Pt confused at this time. Pt stated, "If you're not a doctor then I don't want you to. Nurse called [name], Son to talk with pt. Son and daughter-in-law talked with pt. Pt accepted medications after that. Meds were crushed and placed in applesauce. Tolerated meds well. Bed remains in lowest position and call bell is within reach. Safety mitts are on hands. Safety sitter remains in room with pt. Will continue to monitor pt frequently throughout night.

## 2014-10-07 ENCOUNTER — Telehealth: Payer: Self-pay | Admitting: Family Medicine

## 2014-10-07 DIAGNOSIS — I472 Ventricular tachycardia: Secondary | ICD-10-CM

## 2014-10-07 DIAGNOSIS — R000 Tachycardia, unspecified: Secondary | ICD-10-CM

## 2014-10-07 DIAGNOSIS — L97529 Non-pressure chronic ulcer of other part of left foot with unspecified severity: Secondary | ICD-10-CM

## 2014-10-07 LAB — BASIC METABOLIC PANEL
Anion gap: 7 (ref 5–15)
BUN: 16 mg/dL (ref 6–20)
CO2: 29 mmol/L (ref 22–32)
Calcium: 8.9 mg/dL (ref 8.9–10.3)
Chloride: 108 mmol/L (ref 101–111)
Creatinine, Ser: 1.05 mg/dL (ref 0.61–1.24)
GFR calc Af Amer: >60 mL/min (ref >60)
GFR calc non Af Amer: 60 mL/min — ABNORMAL LOW (ref >60)
Glucose, Bld: 90 mg/dL (ref 65–99)
Potassium: 3.8 mmol/L (ref 3.5–5.1)
Sodium: 144 mmol/L (ref 135–145)

## 2014-10-07 LAB — PROTIME-INR
INR: 2.12 — ABNORMAL HIGH (ref 0.00–1.49)
Prothrombin Time: 23.6 seconds — ABNORMAL HIGH (ref 11.6–15.2)

## 2014-10-07 MED ORDER — AMOXICILLIN 250 MG PO CAPS
500.0000 mg | ORAL_CAPSULE | Freq: Two times a day (BID) | ORAL | Status: DC
  Administered 2014-10-08: 500 mg via ORAL
  Filled 2014-10-07: qty 2

## 2014-10-07 MED ORDER — METOPROLOL SUCCINATE ER 25 MG PO TB24
37.5000 mg | ORAL_TABLET | Freq: Two times a day (BID) | ORAL | Status: DC
  Administered 2014-10-07 – 2014-10-08 (×3): 37.5 mg via ORAL
  Filled 2014-10-07 (×3): qty 2

## 2014-10-07 MED ORDER — WARFARIN SODIUM 5 MG PO TABS
5.0000 mg | ORAL_TABLET | Freq: Once | ORAL | Status: AC
  Administered 2014-10-07: 5 mg via ORAL
  Filled 2014-10-07 (×2): qty 1

## 2014-10-07 NOTE — Evaluation (Signed)
Physical Therapy Evaluation Patient Details Name: Scott [name] MRN: [id number] DOB: 01/31/1922 Today's Date: 10/07/2014   History of Present Illness  HPI: Scott [name] is a 81 y.o. 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 He presents from home with a report of generalized weakness, malaise, poor appetite, decrease in responsiveness, and worsening skin ulcer between the fourth and fifth toes on the left foot. He has also had progressive left foot pain. When asked, the patient says that he has no pain or complaints at this time. The history is being provided primarily by his niece, Scott [name]. Accordingly, the patient has not been feeling well over the past several days. He has 24-hour caretakers. His caretaker this morning reported that the patient was minimally responsive, had a poor appetite, had a low temperature, and was generally weak. There was no witnessed nausea, vomiting, complaints of chest pain or shortness of breath, or bloody stools. There is no complaint of pain with urination.  Pt has very poor vascular supply to the LLE with a gangrenous 5th left toe.  Surgeon has recommended that pt have a L AKA.    Clinical Impression   Pt was seen for evaluation, CG was present.  He stated that prior to this illness, pt was very alert and independent in all ADLs with a cane.  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  Currently, pt has been very confused and was only able to follow commands intermittently.  He had generalized weakness with minimal active motion of the left ankle.  He is to be NWB on the LLE which will more than likely make gait impossible at his advanced age.  Pt needed max assist to transfer bed to chair.  The plan is for  him to return home at d/c with full time assist of CG.  It will assist his care at home to be able to assist with transfers, so we would like to add HHPT.  He will need equipment as outlined below.  I discussed with CG the proper transfer technique.    Follow Up Recommendations Home health PT    Equipment Recommendations  Wheelchair (measurements PT);3in1 (PT) (w/c with elevating leg rest)    Recommendations for Other Services   none    Precautions / Restrictions Precautions Precautions: Fall Precaution Comments: orange contact Restrictions Weight Bearing Restrictions: Yes LLE Weight Bearing: Non weight bearing      Mobility  Bed Mobility Overal bed mobility: Needs Assistance Bed Mobility: Supine to Sit     Supine to sit: Min assist     General bed mobility comments: pt needed min assist to get to EOB due to his difficulty in following directions  Transfers Overall transfer level: Needs assistance Equipment used: None Transfers: Stand Pivot Transfers   Stand pivot transfers: Max assist       General transfer comment: tried to maintain NWB on the LLE  Ambulation/Gait             General Gait Details: unable                        Balance Overall balance assessment:  (good sitting balance)  Pertinent Vitals/Pain Pain Assessment: No/denies pain    Home Living Family/patient expects to be discharged to:: Private residence Living Arrangements: Spouse/significant other;Non-relatives/Friends Available Help at Discharge: Personal care attendant (24 hour CG assist for wife and will be able to assist pt as well) Type of Home: House Home Access: Ramped entrance     Home Layout: One level Home Equipment: Alfred - 2 wheels;Cane - single point      Prior Function Level of Independence: Independent with assistive device(s)         Comments: ambulated with a cane     Hand Dominance         Extremity/Trunk Assessment               Lower Extremity Assessment: Generalized weakness;LLE deficits/detail   LLE Deficits / Details: minimal active movement of the left ankle, gangrenous 5th toe with darkening of the entire foot  Cervical / Trunk Assessment: Kyphotic  Communication   Communication: No difficulties  Cognition Arousal/Alertness: Lethargic Behavior During Therapy: Flat affect Overall Cognitive Status: Impaired/Different from baseline Area of Impairment: Following commands       Following Commands: Follows one step commands inconsistently                          Assessment/Plan    PT Assessment Patient needs continued PT services  PT Diagnosis Generalized weakness   PT Problem List Decreased strength;Decreased activity tolerance;Decreased mobility;Decreased safety awareness;Decreased knowledge of precautions;Cardiopulmonary status limiting activity;Decreased cognition  PT Treatment Interventions Functional mobility training;Therapeutic exercise   PT Goals (Current goals can be found in the Care Plan section) Acute Rehab PT Goals Patient Stated Goal: CG would like for pt to be able to assist with transfers at home PT Goal Formulation: With family Time For Goal Achievement: 10/21/14 Potential to Achieve Goals: Fair    Frequency Min 3X/week   Barriers to discharge   none    Co-evaluation               End of Session Equipment Utilized During Treatment: Gait belt Activity Tolerance: Patient tolerated treatment well Patient left: in chair;with call bell/phone within reach;with family/visitor present Nurse Communication: Mobility status         Time: 1003-1055 PT Time Calculation (min) (ACUTE ONLY): 52 min   Charges:   PT Evaluation $Initial PT Evaluation Tier I: 1 Procedure PT Treatments $Self Care/Home Management: 8-22   PT G CodesDemetrios [name] L  PT 10/07/2014, 11:10 AM [phone]

## 2014-10-07 NOTE — Telephone Encounter (Signed)
Son called.

## 2014-10-07 NOTE — Evaluation (Signed)
Clinical/Bedside Swallow Evaluation Patient Details  Name: Scott [name] MRN: [id number] Date of Birth: 1922-01-06  Today's Date: 10/07/2014 Time: SLP Start Time (ACUTE ONLY): 1212 SLP Stop Time (ACUTE ONLY): 1245 SLP Time Calculation (min) (ACUTE ONLY): 33 min  Past Medical History:  Past Medical History  Diagnosis Date  . HTN (hypertension)   . Hyperlipidemia   . PVCs (premature ventricular contractions)   . Glaucoma   . PVD (peripheral vascular disease)     Right ABI .75, Left .78 (2006)  . Popliteal artery aneurysm, bilateral DECUMENTED CHRONIC PARTIAL OCCLUSION--  PT DENIES CLAUDICATION OR ANY OTHER SYMPTOMS  . Frequency of urination   . Nocturia   . Bladder cancer 03/15/2012  . Chronic systolic CHF (congestive heart failure)     a. NICM - patent cors 06/2012, EF 40% at that time. b. 03/2013 eval: EF 20-25%.  . DVT, bilateral lower limbs 03/2013    a. Bilat DVT dx 03/2013.  . Pulmonary embolism     a. By CT angio 04/2013.  Marland Kitchen Elevated troponin     a. Adm 12/30-04/2013 - not felt to represent ACS; ?due to PE. b. Patent cors 06/2012.  Marland Kitchen NSVT (nonsustained ventricular tachycardia)     a. NSVT 06/2012; NSVT also seen during 03/2013 adm. b. Med rx. Not candidate for ICD given adv age.  Marland Kitchen ETOH abuse   . Syncope     a. Felt to be postural syncope related to diuretics 06/2012.  Marland Kitchen Heart murmur   . DVT (deep venous thrombosis)     "I think in both legs"  . Pneumonia     "couple times"  . DDD (degenerative disc disease)   . Arthritis     "all over"  . Chronic back pain   . Depression   . CAD- non obstructive disease by cath 3/14   . PAD (peripheral artery disease)    Past Surgical History:  Past Surgical History  Procedure Laterality Date  . Transthoracic echocardiogram  09-22-2010    MODERATE LVH/ EF 42%/ GRADE I DIASTOLIC DYSFUNCTION/ AORTIC SCLEROSIS WITHOUT STENOSIS/  RV  SYSTOLIC  MILDLY REDUCED FUNCTION  . Cardiovascular stress test  10-15-2010    LOW RISK NUCLEAR STUDY/ NO  EVIDENCE OF ISCHEMIA/ NORMAL EF  . Shoulder surgery  1970's    "don't remember which side or what kind of OR"  . Transurethral resection of bladder tumor  10/07/2011    Procedure: TRANSURETHRAL RESECTION OF BLADDER TUMOR (TURBT);  Surgeon: Malka So, MD;  Location: [facility];  Service: Urology;  Laterality: N/A;  . Cystoscopy  10/07/2011    Procedure: CYSTOSCOPY;  Surgeon: Malka So, MD;  Location: [facility];  Service: Urology;  Laterality: N/A;  . Cystoscopy with biopsy  03/14/2012    Procedure: CYSTOSCOPY WITH BIOPSY;  Surgeon: Malka So, MD;  Location: [facility];  Service: Urology;  Laterality: N/A;  WITH FULGURATION  . Transurethral resection of prostate  03/14/2012    Procedure: TRANSURETHRAL RESECTION OF THE PROSTATE WITH GYRUS INSTRUMENTS;  Surgeon: Malka So, MD;  Location: [facility];  Service: Urology;  Laterality: N/A;  . Tonsillectomy    . Cataract extraction w/ intraocular lens  implant, bilateral Bilateral   . Cardiac catheterization  05-11-2004  DR [place] PLAQUE/ NORMAL LVF/ EF 55%/  NON-OBSTRUCTIVE LAD 25%  . Cardiac catheterization  06/2012  . Eye surgery     HPI:  Scott [name] is a  81 y.o. 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 He presents from home with a report of generalized weakness, malaise, poor appetite, decrease in responsiveness, and worsening skin ulcer between the fourth and fifth toes on the left foot. He has also had progressive left foot pain. When asked, the patient says that he has no pain or complaints at this time. The history is being provided primarily by his niece, [name]. Accordingly, the patient has not been feeling well over the past several days.  He has 24-hour caretakers. His caretaker this morning reported that the patient was minimally responsive, had a poor appetite, had a low temperature, and was generally weak. There was no witnessed nausea, vomiting, complaints of chest pain or shortness of breath, or bloody stools. There is no complaint of pain with urination. Pt has very poor vascular supply to the LLE with a gangrenous 5th left toe. Surgeon has recommended that pt have a L AKA.SLP asked to evaluate swallow due to possible aspiration PNA. His diet was downgraded to D2/NTL.    Assessment / Plan / Recommendation Clinical Impression  Scott [name] was seen during lunch meal; caregiver present who fed him and provided background information. He typically feeds himself and reportedly has no issues with chewing, choking, coughing, or swallowing. He did become disoriented with UTI and caregiver feels this may have had an impact of safe swallow. Presently he is sitting up in chair consuming D2 with NTL. Pt with prolonged oral phase with solids and presented with small cough x1. He was assessed with NTL and thin liquids. Swallow initiation appeared prompt and no signs of distress; vocal quality remains clear. Pt was encouraged to swallow 2-3x for each bite/sip to ensure clearance and he verbalized agreement, but needed reminders. Risk for aspiration appears low at this time, however risks increase if cogntition decreases ( possible sundowning). Recommend continuing D2 and upgrading liquids to thin liquids, straw ok. Pt must be alert and upright for all eating and drinking, preferably in a chair. Caregiver in agreement with plan. SLP will follow tomorrow for diet tolerance.     Aspiration Risk  Mild    Diet Recommendation Dysphagia 2 (Fine chop);Thin   Medication Administration: Crushed with puree Compensations: Small sips/bites;Multiple dry swallows after each bite/sip    Other  Recommendations Oral Care Recommendations: Oral care  BID;Staff/trained caregiver to provide oral care Other Recommendations: Clarify dietary restrictions   Follow Up Recommendations       Frequency and Duration min 2x/week  1 week   Pertinent Vitals/Pain VSS    SLP Swallow Goals   Pt will demonstrate safe and efficient consumption of least restrictive diet with use of strategies as needed.   Swallow Study Prior Functional Status   Lives at home with wife who has a caregiver    General Date of Onset: 10/03/14 Other Pertinent Information: Scott [name] is a 81 y.o. 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 He presents from home with a report of generalized weakness, malaise, poor appetite, decrease in responsiveness, and worsening skin ulcer between the fourth and fifth toes on the left foot. He has also had progressive left foot  pain. When asked, the patient says that he has no pain or complaints at this time. The history is being provided primarily by his niece, [name]. Accordingly, the patient has not been feeling well over the past several days. He has 24-hour caretakers. His caretaker this morning reported that the patient was minimally responsive, had a poor appetite, had a low temperature, and was generally weak. There was no witnessed nausea, vomiting, complaints of chest pain or shortness of breath, or bloody stools. There is no complaint of pain with urination. Pt has very poor vascular supply to the LLE with a gangrenous 5th left toe. Surgeon has recommended that pt have a L AKA.SLP asked to evaluate swallow due to possible aspiration PNA. His diet was downgraded to D2/NTL.  Type of Study: Bedside swallow evaluation Diet Prior to this Study: Dysphagia 2 (chopped);Nectar-thick  liquids Temperature Spikes Noted: No Respiratory Status: Room air History of Recent Intubation: No Behavior/Cognition: Alert;Cooperative;Pleasant mood Oral Cavity - Dentition: Poor condition Self-Feeding Abilities: Able to feed self;Needs set up Patient Positioning: Upright in chair/Tumbleform Baseline Vocal Quality: Normal Volitional Cough: Weak Volitional Swallow: Able to elicit    Oral/Motor/Sensory Function Overall Oral Motor/Sensory Function: Appears within functional limits for tasks assessed Labial ROM: Within Functional Limits Labial Symmetry: Within Functional Limits Labial Strength: Within Functional Limits Labial Sensation: Within Functional Limits Lingual ROM: Within Functional Limits Lingual Symmetry: Within Functional Limits Lingual Strength: Within Functional Limits Lingual Sensation: Within Functional Limits Facial ROM: Within Functional Limits Facial Symmetry: Within Functional Limits Facial Strength: Within Functional Limits Facial Sensation: Within Functional Limits Velum: Within Functional Limits Mandible: Within Functional Limits   Ice Chips Ice chips: Not tested   Thin Liquid Thin Liquid: Within functional limits Presentation: Cup;Self Fed;Straw    Nectar Thick Nectar Thick Liquid: Within functional limits Presentation: Cup   Honey Thick Honey Thick Liquid: Not tested   Puree Puree: Within functional limits Presentation: Spoon   Solid   Thank you,  Genene Churn, CCC-SLP [phone]     Solid: Within functional limits (prolonged oral transit) Presentation: Spoon       PORTER,DABNEY 10/07/2014,4:32 PM

## 2014-10-07 NOTE — Progress Notes (Signed)
[place] for Coumadin Indication: atrial fibrillation, hx VTE  Allergies  Allergen Reactions  . Lipitor [Atorvastatin] Other (See Comments)    unknown    Patient Measurements: Height: 5\' 10"  (177.8 cm) Weight: 155 lb 10.3 oz (70.6 kg) IBW/kg (Calculated) : 73  Vital Signs: Temp: 98.7 F (37.1 C) (06/27 0620) Temp Source: Oral (06/27 0620) BP: 134/60 mmHg (06/27 0634) Pulse Rate: 71 (06/27 0634)  Labs:  Recent Labs  10/05/14 0520 10/06/14 0512 10/07/14 0627  HGB 10.3* 9.7*  --   HCT 31.5* 30.0*  --   PLT 265 249  --   LABPROT 36.0* 23.9* 23.6*  INR 3.72* 2.16* 2.12*  CREATININE 1.16 1.10 1.05    Estimated Creatinine Clearance: 45.8 mL/min (by C-G formula based on Cr of 1.05).   Medical History: Past Medical History  Diagnosis Date  . HTN (hypertension)   . Hyperlipidemia   . PVCs (premature ventricular contractions)   . Glaucoma   . PVD (peripheral vascular disease)     Right ABI .75, Left .78 (2006)  . Popliteal artery aneurysm, bilateral DECUMENTED CHRONIC PARTIAL OCCLUSION--  PT DENIES CLAUDICATION OR ANY OTHER SYMPTOMS  . Frequency of urination   . Nocturia   . Bladder cancer 03/15/2012  . Chronic systolic CHF (congestive heart failure)     a. NICM - patent cors 06/2012, EF 40% at that time. b. 03/2013 eval: EF 20-25%.  . DVT, bilateral lower limbs 03/2013    a. Bilat DVT dx 03/2013.  . Pulmonary embolism     a. By CT angio 04/2013.  Marland Kitchen Elevated troponin     a. Adm 12/30-04/2013 - not felt to represent ACS; ?due to PE. b. Patent cors 06/2012.  Marland Kitchen NSVT (nonsustained ventricular tachycardia)     a. NSVT 06/2012; NSVT also seen during 03/2013 adm. b. Med rx. Not candidate for ICD given adv age.  Marland Kitchen ETOH abuse   . Syncope     a. Felt to be postural syncope related to diuretics 06/2012.  Marland Kitchen Heart murmur   . DVT (deep venous thrombosis)     "I think in both legs"  . Pneumonia     "couple times"  . DDD (degenerative disc  disease)   . Arthritis     "all over"  . Chronic back pain   . Depression   . CAD- non obstructive disease by cath 3/14   . PAD (peripheral artery disease)     Medications:  Prescriptions prior to admission  Medication Sig Dispense Refill Last Dose  . donepezil (ARICEPT) 10 MG tablet TAKE ONE TABLET AT BEDTIME 30 tablet 2 Unknown  . fluticasone (FLONASE) 50 MCG/ACT nasal spray Place 2 sprays into both nostrils daily. 16 g 6 Unknown  . furosemide (LASIX) 40 MG tablet 40 MG ON MON, WED, AND FRI'S; THEN 20 MG ALL OTHER DAYS 30 tablet 5 Unknown  . gabapentin (NEURONTIN) 100 MG capsule TAKE (2) CAPSULES TWICE DAILY. 120 capsule 2 Unknown  . HORSE CHESTNUT PO Take 1 capsule by mouth daily.   Unknown  . HYDROcodone-acetaminophen (NORCO/VICODIN) 5-325 MG per tablet Take 1 tablet by mouth every 6 (six) hours as needed for moderate pain or severe pain. 60 tablet 0 10/03/2014 at Unknown time  . isosorbide mononitrate (IMDUR) 30 MG 24 hr tablet Take 0.5 tablets (15 mg total) by mouth daily. 30 tablet 5 Unknown  . lidocaine (XYLOCAINE) 2 % jelly APPLY AS NEEDED TO THE LEFT FOOD ULCER  2 Unknown  .  losartan (COZAAR) 25 MG tablet Take 1 tablet (25 mg total) by mouth daily. 90 tablet 3 Unknown  . metoprolol succinate (TOPROL-XL) 50 MG 24 hr tablet TAKE (1) TABLET TWICE A DAY. 60 tablet 0 Unknown  . nitroGLYCERIN (NITROSTAT) 0.4 MG SL tablet Place 1 tablet (0.4 mg total) under the tongue every 5 (five) minutes as needed for chest pain (up to 3 doses). 25 tablet 3 Unknown  . potassium chloride SA (K-DUR,KLOR-CON) 20 MEQ tablet Take 1 tablet (20 mEq total) by mouth daily. 90 tablet 1 Unknown  . simvastatin (ZOCOR) 10 MG tablet TAKE 1 TABLET DAILY 30 tablet 4 Unknown  . spironolactone (ALDACTONE) 25 MG tablet TAKE 1 TABLET DAILY 30 tablet 3 unknown  . tamsulosin (FLOMAX) 0.4 MG CAPS capsule TAKE (1) CAPSULE DAILY 30 capsule 5 Unknown  . ULORIC 40 MG tablet TAKE 1 TABLET DAILY 30 tablet 4 Unknown  . warfarin  (COUMADIN) 2.5 MG tablet TAKE UP TO 3 TABLETS DAILY AS DIRECTED BY CPP 90 tablet 2 Unknown  . doxycycline (VIBRA-TABS) 100 MG tablet Take 1 tablet (100 mg total) by mouth 2 (two) times daily. With food (Patient not taking: Reported on 10/03/2014) 28 tablet 0 Taking  . meclizine (ANTIVERT) 32 MG tablet Take 1 tablet (32 mg total) by mouth 3 (three) times daily as needed. (Patient not taking: Reported on 10/03/2014) 30 tablet 0 Taking  . megestrol (MEGACE) 20 MG tablet TAKE 1 TABLET ONCE A DAY 30 tablet 3 Unknown  . mupirocin ointment (BACTROBAN) 2 % Apply small amt with qtip to wound after soaking TID (Patient not taking: Reported on 10/03/2014) 22 g 0 Taking    Assessment: 81 yo M on chronic Coumadin PTA for hx Afib & VTE.  INR supra-therapeutic on admission.  He was given 3mg  Vit K sq x1 on 6/24.  INR within goal range today.  No bleeding noted.  Her outpatient [facility] flowsheet patient's Coumadin was decreased to 5mg  daily except 7.5mg  on Mon & Fri ~ 1 month ago.    Goal of Therapy:  INR 2-3   Plan:  Coumadin 5mg  po x1 today Daily INR Consider d/c home on Coumadin 5mg  po daily with outpatient follow-up  Biagio Borg 10/07/2014,7:59 AM

## 2014-10-07 NOTE — Progress Notes (Signed)
Patient ID: LIRON EISSLER, male   DOB: 09/02/1921, 81 y.o.   MRN: [id number]     Subjective:    No palpitations.   Objective:   Temp:  [98.2 F (36.8 C)-99.3 F (37.4 C)] 98.7 F (37.1 C) (06/27 0620) Pulse Rate:  [67-95] 71 (06/27 0634) Resp:  [16-19] 16 (06/27 0634) BP: (110-137)/(38-75) 134/60 mmHg (06/27 0634) SpO2:  [95 %-100 %] 100 % (06/27 0656) Weight:  [155 lb 10.3 oz (70.6 kg)] 155 lb 10.3 oz (70.6 kg) (06/27 0620) Last BM Date: 10/03/14  Filed Weights   10/05/14 0500 10/06/14 0500 10/07/14 0620  Weight: 156 lb 4.9 oz (70.9 kg) 156 lb 4.9 oz (70.9 kg) 155 lb 10.3 oz (70.6 kg)    Intake/Output Summary (Last 24 hours) at 10/07/14 0856 Last data filed at 10/07/14 0800  Gross per 24 hour  Intake 660.33 ml  Output    200 ml  Net 460.33 ml    Telemetry: SR with LBBB. PVCs, runs of accelerated idioventricular rhythm  Exam: Patient refuses exam  Lab Results:  Basic Metabolic Panel:  Recent Labs Lab 10/04/14 1846 10/05/14 0520 10/06/14 0512 10/07/14 0627  NA  --  139 141 144  K  --  4.1 4.4 3.8  CL  --  106 109 108  CO2  --  26 24 29   GLUCOSE  --  94 87 90  BUN  --  29* 21* 16  CREATININE  --  1.16 1.10 1.05  CALCIUM  --  8.7* 8.7* 8.9  MG 1.9  --   --   --     Liver Function Tests:  Recent Labs Lab 10/04/14 0450  AST 32  ALT 26  ALKPHOS 112  BILITOT 0.7  PROT 6.7  ALBUMIN 3.3*    CBC:  Recent Labs Lab 10/03/14 1129 10/05/14 0520 10/06/14 0512  WBC 7.6 8.5 9.6  HGB 12.2* 10.3* 9.7*  HCT 37.1* 31.5* 30.0*  MCV 83.4 83.8 84.7  PLT 262 265 249    Cardiac Enzymes: No results for input(s): CKTOTAL, CKMB, CKMBINDEX, TROPONINI in the last 168 hours.  BNP: No results for input(s): PROBNP in the last 8760 hours.  Coagulation:  Recent Labs Lab 10/05/14 0520 10/06/14 0512 10/07/14 0627  INR 3.72* 2.16* 2.12*    ECG:   Medications:   Scheduled Medications: . Chlorhexidine Gluconate Cloth  6 each Topical Q0600  . donepezil   10 mg Oral QHS  . febuxostat  40 mg Oral Daily  . feeding supplement (ENSURE ENLIVE)  237 mL Oral BID BM  . fluticasone  2 spray Each Nare Daily  . folic acid  1 mg Oral Daily  . furosemide  20 mg Oral Daily  . gabapentin  100 mg Oral BID  . isosorbide mononitrate  15 mg Oral Daily  . levalbuterol  0.63 mg Nebulization Q8H  . metoprolol succinate  25 mg Oral BID  . multivitamin with minerals  1 tablet Oral Daily  . mupirocin ointment  1 application Nasal BID  . piperacillin-tazobactam (ZOSYN)  IV  3.375 g Intravenous Q8H  . simvastatin  10 mg Oral Daily  . tamsulosin  0.4 mg Oral QPC breakfast  . thiamine  100 mg Oral Daily   Or  . thiamine  100 mg Intravenous Daily  . vancomycin  1,250 mg Intravenous Q24H  . warfarin  5 mg Oral Once  . Warfarin - Pharmacist Dosing Inpatient   Does not apply Q24H     Infusions: .  sodium chloride 10 mL/hr at 10/06/14 1258     PRN Medications:  acetaminophen **OR** acetaminophen, alum & mag hydroxide-simeth, food thickener, guaiFENesin-dextromethorphan, levalbuterol, LORazepam, metoprolol, morphine injection, nitroGLYCERIN, ondansetron **OR** ondansetron (ZOFRAN) IV, oxyCODONE     Assessment/Plan    1. Chronic systolic heart failure - hx of ICM, echo 10/04/14 LVEF 50-55% (improved from 20-25% from Jan 2015) - appears euvolemic, continue current meds  2. Hx of bilateral DVT - is on coumadin  3. NSVT - from consult note noted to have runs of afib and SVT with his underlying LBBB, as well as runs of NSVT.  He presented with acute infection and with multiple electrolyte abnormalities - tele shows runs of accelerated idioventricular rhythm, no sustatined ventricular arrhythmias - echo shows significantly improved LVEF - will increase Toprol XL to 37.5mg  bid  4. PAF - on coumadin, Toprol for rate control.      Carlyle Dolly, M.D.

## 2014-10-07 NOTE — Plan of Care (Signed)
After 4 attempts to get new IV for patient - IV team was able to succeed, but took 4 people to assist in holding patient. IV is now wrapped and in place. Pt refused and spat pill out. Would not take or swallow any "God Damn Pills" even w/ family and paid aid attempting to assist. Coumadin was not given.

## 2014-10-07 NOTE — Plan of Care (Signed)
Son came up to patient's room insisting to see Dr. Marland KitchenASAP." RN attempted to educate since not an emergency and approaching end of shift, but agreed to text Dr. With request. Dr. Curly Rim to inform.

## 2014-10-07 NOTE — Progress Notes (Signed)
[facility] PROGRESS NOTE  Scott [name] NTZ:[id number] DOB: December 05, 1921 DOA: 10/03/2014 PCP: Redge Gainer, MD    Code Status: DO NOT RESUSCITATE Family Communication: Discussion with son on 6/27 Disposition Plan: Discharge when clinically appropriate, hopefully in the next 1-2  days.   Consultants:  Cardiology   Procedures:  10/04/2014 - 2-D echocardiogram : Study conclusions- Left ventricle: Abnormal septal motion The cavity size was normal. There was mild concentric hypertrophy. Systolic function was normal. The estimated ejection fraction was in the range of 50% to 55%. Doppler parameters are consistent with abnormal left ventricular relaxation (grade 1 diastolic dysfunction). - Aortic valve: There was trivial regurgitation. - Mitral valve: Calcified annulus.  Antibiotics:  Amoxicillin 6/28>>  Vancomycin 6/23 >   Zosyn 6/23>>6/27   HPI/Subjective: Patient is sitting up, somewhat groggy from lorazepam given. He becomes agitated during orally morning hours necessitating lorazepam. He has no complaints of left foot pain.  Objective: Filed Vitals:   10/07/14 1314  BP: 134/60  Pulse: 68  Temp: 98.5 F (36.9 C)  Resp: 16   temperature 98.5. Oxygen saturation 100% on room air.  Intake/Output Summary (Last 24 hours) at 10/07/14 1558 Last data filed at 10/07/14 1200  Gross per 24 hour  Intake 900.33 ml  Output    200 ml  Net 700.33 ml   Filed Weights   10/05/14 0500 10/06/14 0500 10/07/14 0620  Weight: 70.9 kg (156 lb 4.9 oz) 70.9 kg (156 lb 4.9 oz) 70.6 kg (155 lb 10.3 oz)    Exam:   General: Elderly African-American man who is slightly sedated, in no acute distress.  Cardiovascular: S1, S2, with occasional ectopy  Respiratory: Lungs clear anteriorly with decreased breath sounds in the bases; resolution of pulmonary crackles.  Abdomen: positive bowel sounds, soft, nontender, nondistended.  Musculoskeletal/extremities: Left lower extremity  with significantly less edema at approximately trace- 1+ and 1+ to 2+ left pedal edema; resolved erythema of the pretibial area of the left lower extremity; the left fifth toe is gangrenous and cool to touch; there is a deep fissure between the left fourth and fifth toes with no visible drainage currently; no palpable pulse on the left lower extremity. Right lower extremity without edema and with a palpable pulse.  Neurologic: The patient is mildly sedated, but oriented to himself in the hospital. His speech is clear.  Data Reviewed: Basic Metabolic Panel:  Recent Labs Lab 10/03/14 1808 10/04/14 0450 10/04/14 1846 10/05/14 0520 10/06/14 0512 10/07/14 0627  NA 140 139  --  139 141 144  K 5.6* 4.9  --  4.1 4.4 3.8  CL 106 106  --  106 109 108  CO2 27 23  --  26 24 29   GLUCOSE 105* 101*  --  94 87 90  BUN 65* 54*  --  29* 21* 16  CREATININE 2.17* 1.74*  --  1.16 1.10 1.05  CALCIUM 9.6 8.9  --  8.7* 8.7* 8.9  MG  --   --  1.9  --   --   --    Liver Function Tests:  Recent Labs Lab 10/04/14 0450  AST 32  ALT 26  ALKPHOS 112  BILITOT 0.7  PROT 6.7  ALBUMIN 3.3*   No results for input(s): LIPASE, AMYLASE in the last 168 hours. No results for input(s): AMMONIA in the last 168 hours. CBC:  Recent Labs Lab 10/03/14 1129 10/05/14 0520 10/06/14 0512  WBC 7.6 8.5 9.6  NEUTROABS 6.1  --   --  HGB 12.2* 10.3* 9.7*  HCT 37.1* 31.5* 30.0*  MCV 83.4 83.8 84.7  PLT 262 265 249   Cardiac Enzymes: No results for input(s): CKTOTAL, CKMB, CKMBINDEX, TROPONINI in the last 168 hours. BNP (last 3 results)  Recent Labs  03/26/14 1511  BNP 137.5*    ProBNP (last 3 results) No results for input(s): PROBNP in the last 8760 hours.  CBG: No results for input(s): GLUCAP in the last 168 hours.  Recent Results (from the past 240 hour(s))  Blood culture (routine x 2)     Status: None (Preliminary result)   Collection Time: 10/03/14 11:30 AM  Result Value Ref Range Status    Specimen Description BLOOD RIGHT ANTECUBITAL  Final   Special Requests BOTTLES DRAWN AEROBIC AND ANAEROBIC 6CC EACH  Final   Culture NO GROWTH 4 DAYS  Final   Report Status PENDING  Incomplete  Blood culture (routine x 2)     Status: None (Preliminary result)   Collection Time: 10/03/14 12:10 PM  Result Value Ref Range Status   Specimen Description BLOOD LEFT ANTECUBITAL  Final   Special Requests BOTTLES DRAWN AEROBIC ONLY 8CC  Final   Culture NO GROWTH 4 DAYS  Final   Report Status PENDING  Incomplete  Urine culture     Status: None   Collection Time: 10/03/14 12:10 PM  Result Value Ref Range Status   Specimen Description URINE, CATHETERIZED  Final   Special Requests NONE  Final   Culture   Final    NO GROWTH 2 DAYS Performed at [facility]    Report Status 10/05/2014 FINAL  Final  MRSA PCR Screening     Status: Abnormal   Collection Time: 10/03/14  5:27 PM  Result Value Ref Range Status   MRSA by PCR POSITIVE (A) NEGATIVE Final    Comment:        The GeneXpert MRSA Assay (FDA approved for NASAL specimens only), is one component of a comprehensive MRSA colonization surveillance program. It is not intended to diagnose MRSA infection nor to guide or monitor treatment for MRSA infections. RESULT CALLED TO, READ BACK BY AND VERIFIED WITH: Bethann Humble AT 2200 ON [id number] BY FORSYTH K      Studies: No results found.  Scheduled Meds: . Chlorhexidine Gluconate Cloth  6 each Topical Q0600  . donepezil  10 mg Oral QHS  . febuxostat  40 mg Oral Daily  . feeding supplement (ENSURE ENLIVE)  237 mL Oral BID BM  . fluticasone  2 spray Each Nare Daily  . folic acid  1 mg Oral Daily  . furosemide  20 mg Oral Daily  . gabapentin  100 mg Oral BID  . isosorbide mononitrate  15 mg Oral Daily  . levalbuterol  0.63 mg Nebulization Q8H  . metoprolol succinate  37.5 mg Oral BID  . multivitamin with minerals  1 tablet Oral Daily  . mupirocin ointment  1 application Nasal BID  .  piperacillin-tazobactam (ZOSYN)  IV  3.375 g Intravenous Q8H  . simvastatin  10 mg Oral Daily  . tamsulosin  0.4 mg Oral QPC breakfast  . thiamine  100 mg Oral Daily   Or  . thiamine  100 mg Intravenous Daily  . vancomycin  1,250 mg Intravenous Q24H  . warfarin  5 mg Oral Once  . Warfarin - Pharmacist Dosing Inpatient   Does not apply Q24H   Continuous Infusions: . sodium chloride 10 mL/hr at 10/06/14 1258    Assessment and  plan:  Principal Problem:   Gangrene of toe Active Problems:   Sepsis due to other etiology   Hypothermia   Ischemic ulcer of left foot   Atherosclerotic PVD with ulceration   Cellulitis of left leg   Aspiration pneumonia   Chronic systolic heart failure   UTI (lower urinary tract infection)   Warfarin-induced coagulopathy   Acute kidney injury   Hyperkalemia   Tachyarrhythmia  Gangrene of the left fifth toe, in the setting of atherosclerotic PVD with ulceration between the left fourth and fifth toes/ischemic ulcer, and mild cellulitis of the left leg.  The patient was recently evaluated by vascular surgeon, Dr. Kellie Simmering on 09/24/14. He recommended to the patient and the patient's family a left above-the-knee amputation. He did not believe the patient was a candidate for open bypass procedure given his extensive occlusion of the superficial femoral popliteal and tibial vessels-angioplasty and stenting would not be successful, particularly in the setting of recent renal insufficiency. On admission, the x-ray of his foot revealed diffuse soft tissue swelling involving the fifth toe with air in the soft tissues, consistent with gangrene. Septic arthritis or osteomyelitis cannot be ruled out.The patient has been started on vancomycin and Zosyn. Blood cultures are negative to date. MRI of his foot was not ordered as it would not change the management. - Ultimately, the patient needs an amputation as stated by Dr. Kellie Simmering. Apparently, he discussed this with the patient's  son and daughter-in-law who is an Doctor, general practice in [place] on 09/24/14. The patient will obviously be a high risk for surgery. -Updated the patient's son [name] and daughter-in-law, Dr. Wynetta Emery regarding his progress on 6/25. [name] prefers that we continue to treat the patient medically and he will decide on a referral back to Dr. Kellie Simmering following discharge. I did give him the option of transferring him to [facility] if an urgent amputation is needed. However, he was informed that the patient was improving clinically and symptomatically.  [name] was updated on 6/26. -Patient has had no complaints of foot pain. The erythema in the left leg has resolved and there is significantly less edema and bogginess of his foot. Gangrenous left fifth toe remains. -We will narrow antibiotic to amoxicillin and discontinue Zosyn. Will continue vancomycin until discharge. Aspiration pneumonia. Patient was noted to have pulmonary crackles on exam on 6/25. Follow-up chest x-ray revealed worsening right perihilar and bibasal or opacities right greater than left. Given the patient's altered mental status and possible emesis prior to admission, it is likely that he has aspirated and the infiltrate fluffed out with hydration. His diet has been downgraded to dysphagia 2 with nectar liquids. Xopenex added for bronchodilation. Speech therapy consulted for an official evaluation and management recommendations. -On exam, he has significantly fewer crackles and rhonchi. He is afebrile and his white blood cell count is within normal limits. Possible UTI.  Will continue antibiotics as above. Urine culture was negative. Sepsis and mild lactic acidosis likely secondary to left fifth toe gangrene and UTI.  The patient's lactic acid was initially 1.3, but increased to 3.2; then decreased to 2.4. . Blood cultures and a urine culture results are negative to date. Clinically, sepsis has resolved. We'll continue antibiotics as  stated above. Hypothermia, secondary to sepsis.  His temperature on admission was 96.6. It has improved and normalized. Acute kidney injury.  One year ago, the patient's creatinine was 1.1-1.5.  On admission, it is 2.83. With hydration, treatment of infections, it has improved and normalized.  Lasix, losartan, and spironolactone were initially held and he was hydrated with IV fluids. He had some chest congestion on 6/25, so he was given one IV dose of Lasix and his fluids were decreased. His creatinine has improved progressively and has normalized.   Etiology likely prerenal or possibly ATN from the infection and hypovolemia and/or from chronic ARB treatment.  -We'll continue to hold spironolactone and losartan. Will restart lower dose of Lasix if his blood pressure tolerates it. Hyperkalemia.  Patient's serum potassium was 6.0 on admission. He was treated with bicarbonate, Kayexalate, and calcium carbonate. His serum potassium has normalized. Bicarbonate was discontinued in the IV fluids. Etiology was likely secondary to outpatient potassium supplementation, spironolactone, and losartan. Warfarin-induced coagulopathy. The patient is treated with Coumadin chronically for his history of bilateral DVT and PE. His INR was 7.27 on admission. It had increased to 9.29. Although there were no sign of bleeding, I choose to give him 2.5 mg of vitamin K subcutaneous empirically. His INR is now below 3. Pharmacy has been consulted to monitor and to dose Coumadin daily. -His INR is therapeutic at 2.12. Tachyarrhythmia. It was thought that the patient had PAF with rapid ventricular response versus wide complex tachycardia. He has a history of nonsustained V. tach and frequent PVCs. He was deemed not to be an ICD candidate by cardiology in the past. As needed IV metoprolol and subsequently oral Toprol XL was restarted. He was given IV fluid hydration. All of these measures helped as his heart rate has  decreased. TSH is within normal limits. Cardiology was consulted. Per cardiologist, Dr. Haroldine Laws, the patient has baseline left bundle branch block with runs of NSVT. He agreed with restarting Toprol-XL with titration up to 50 mg twice a day, the patient's home dose as tolerated. He recommended consideration of amiodarone if the ectopy increases. -Patient's heart rate and blood pressure have improved. Cardiology increase the dose of Toprol-XL to 37.5 mg twice a day (the patient had been on 50 mg twice a day). Nonischemic cardiomyopathy/chronic systolic heart failure.   He is treated chronically with losartan, Lasix, spironolactone, and metoprolol. These were held on admission secondary to above. Follow-up 2-D echocardiogram on 6/24 revealed an EF of 50-55% and grade 1 diastolic dysfunction. This is compared to ejection fraction 20-25% per echo 04/2013. On admission, he appeared to be volume depleted, so he was started on IV fluid hydration. His chest x-ray on admission revealed no acute findings. Follow-up chest x-ray on 6/25 revealed right greater than left perihilar and bibasilar opacities, which are thought to be secondary to aspiration pneumonia. He was given one 20 mg IV dose of Lasix and started on Xopenex. His lung exam is better. -Would favor restarting a smaller dose of Lasix daily at 20 mg daily.   Acute encephalopathy/agitation. The patient has been intermittently agitated, mostly at night and during the early morning hours, which may be secondary to sundowning. He is treated with Aricept, so he likely has mild dementia. However, cardiology nurse practitioner, Ms. 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 The patient and his wife was questioned about this and acknowledged that he had drunk alcohol in the past, but not in one month. Nevertheless, vitamin therapy and as needed Ativan were ordered for questionable  superimposed alcohol withdrawal, though this is not completely clear versus sundowning. TSH and vitamin B12 were assessed and both were within normal limits. -Patient's son and other family members  state that the patient has not drunk much alcohol in over a year. -We'll discontinue Ativan as it is causing too much sedation. Sales promotion account executive for redirection.  Normocytic anemia.  Patient's hemoglobin was 12.2 on admission. It has trended downward to 9.7. This decreases thought to be secondary to hemodilution. There've been no signs of GI or GU bleeding. He is on Coumadin chronically, so Pepcid was added empirically.     Time spent: 30 minutes.    [facility] Pager [phone]. If 7PM-7AM, please contact night-coverage at [email or web address], password [email or web address] 10/07/2014, 3:58 PM  LOS: 4 days

## 2014-10-07 NOTE — Plan of Care (Signed)
Dr. Phill Myron that 3rd shift attempt to give coumadin again...since pt refused earlier attempt. RN passed request to 3rd shift RN.

## 2014-10-08 ENCOUNTER — Encounter (HOSPITAL_COMMUNITY): Payer: Self-pay | Admitting: Internal Medicine

## 2014-10-08 DIAGNOSIS — I5022 Chronic systolic (congestive) heart failure: Secondary | ICD-10-CM

## 2014-10-08 DIAGNOSIS — I96 Gangrene, not elsewhere classified: Secondary | ICD-10-CM

## 2014-10-08 LAB — BASIC METABOLIC PANEL
Anion gap: 10 (ref 5–15)
BUN: 17 mg/dL (ref 6–20)
CO2: 28 mmol/L (ref 22–32)
Calcium: 9 mg/dL (ref 8.9–10.3)
Chloride: 105 mmol/L (ref 101–111)
Creatinine, Ser: 1.08 mg/dL (ref 0.61–1.24)
GFR calc Af Amer: >60 mL/min (ref >60)
GFR calc non Af Amer: 58 mL/min — ABNORMAL LOW (ref >60)
Glucose, Bld: 90 mg/dL (ref 65–99)
Potassium: 3.9 mmol/L (ref 3.5–5.1)
Sodium: 143 mmol/L (ref 135–145)

## 2014-10-08 LAB — CULTURE, BLOOD (ROUTINE X 2)
Culture: NO GROWTH
Culture: NO GROWTH

## 2014-10-08 LAB — PROTIME-INR
INR: 2.27 — ABNORMAL HIGH (ref 0.00–1.49)
Prothrombin Time: 24.8 seconds — ABNORMAL HIGH (ref 11.6–15.2)

## 2014-10-08 MED ORDER — WARFARIN SODIUM 2.5 MG PO TABS: ORAL_TABLET | ORAL | Status: DC

## 2014-10-08 MED ORDER — AMOXICILLIN 500 MG PO CAPS: 500.0000 mg | ORAL_CAPSULE | Freq: Two times a day (BID) | ORAL | Status: DC

## 2014-10-08 MED ORDER — METOPROLOL SUCCINATE ER 50 MG PO TB24: 50.0000 mg | ORAL_TABLET | Freq: Two times a day (BID) | ORAL | Status: DC

## 2014-10-08 NOTE — Progress Notes (Signed)
Patient has mobility limitations that impair their ability to do one or more mobility related activities of daily living in customary locations in the home.  Patient cannot use crutches, cane or walker to resolve the issue sufficiently, patient can safely self-propel the wheelchair in the home or has a caregiver that can assist.  

## 2014-10-08 NOTE — Discharge Planning (Signed)
Pt IV and tele removed. VSS and RN assessment indicated stability.  Discharge instructions given, explained and educated. Spoke with caregiver and son via speaker phone.  Also informed of need to get INR lab drawn in 3-5 days - to inform coumadin levels are appropriate. Informed of suggested FU Appts and also given script.    *Son still requesting Final FU phone call from [place].Marland KitchenMarland KitchenDr texted to inform.   Pt will be wheeled to car and caregiver will be transporting patient home.

## 2014-10-08 NOTE — Care Management (Signed)
Important Message  Patient Details  Name: Scott [name] MRN: [id number] Date of Birth: 1921/08/30   Medicare Important Message Given:  Yes-second notification given    Sherald Barge, RN 10/08/2014, 11:48 AM

## 2014-10-08 NOTE — Care Management Note (Signed)
Case Management Note  Patient Details  Name: Scott [name] MRN: [id number] Date of Birth: 22-Jun-1921 Expected Discharge Date:  10/06/14               Expected Discharge Plan:  [place]  In-House Referral:  NA  Discharge planning Services  CM Consult  Post Acute Care Choice:  Durable Medical Equipment, Home Health, Hospice Choice offered to:  Patient, [place] / Guardian  DME Arranged:  3-N-1, Wheelchair manual DME Agency:  [place]:  RN, PT, Nurse's Aide [place] Agency:  [place]  Status of Service:  Completed, signed off  Medicare Important Message Given:  Yes-second notification given Date Medicare IM Given:  10/04/14 Medicare IM give by:  Jolene Provost, RN, MSN, CM  Date Additional Medicare IM Given:    Additional Medicare Important Message give by:     If discussed at [place] of Stay Meetings, dates discussed:  10/08/2014   Additional Comments: Patient being discharged home today with 24/7 sitters. Pt will have [place] RN and PT through [facility], per family choice. Pt will also have wheelchair and BSC delivered to home through [facility], per family choice. Romualdo Bolk, of [facility], made aware of [place] and DME needs and will obtain pt info from chart. CM spoke with POA and son [name]) who is agreeable to [facility] and DME. [name] is also interested in Hospice services and would like to speak with someone from [facility]. About options for his dad's care. The patient has a MD appointment on 10/09/2014 and no decisions about hospice will be made until then. [facility] Contacted and faxed patient info. Larena Glassman will contact the son by phone and arrange for a meeting if the son wishes. Pt's son also questioned how patient would be transported home, Cm discussed with son options of EMS vs [facility]. Patient's son addiment he did not want to be billed for the transportation. CM later approached by patients sitter and stated family would be  transporting patient themselves. Patient's RN aware of discharge plan. No further CM needs.  Sherald Barge, RN 10/08/2014, 11:50 AM

## 2014-10-08 NOTE — Progress Notes (Addendum)
Primary Cardiologist: Minus Breeding  Cardiology Specific Problem List: 1. NSVT 2. Chronic LBBB 3. NICM  Subjective:    Confused to place and time, but remembers what was on the news this am.  Objective:   Temp:  [97.5 F (36.4 C)-98.5 F (36.9 C)] 97.5 F (36.4 C) (06/28 0602) Pulse Rate:  [68-97] 97 (06/28 0602) Resp:  [16] 16 (06/28 0602) BP: (133-140)/(60-81) 140/81 mmHg (06/28 0602) SpO2:  [99 %-100 %] 100 % (06/28 0602) Last BM Date: 10/03/14  Filed Weights   10/05/14 0500 10/06/14 0500 10/07/14 0620  Weight: 156 lb 4.9 oz (70.9 kg) 156 lb 4.9 oz (70.9 kg) 155 lb 10.3 oz (70.6 kg)    Intake/Output Summary (Last 24 hours) at 10/08/14 5885 Last data filed at 10/08/14 0277  Gross per 24 hour  Intake    480 ml  Output      0 ml  Net    480 ml    Telemetry: LBBB with frequent PVC's   Exam:  General: No acute distress.  Lungs: Clear to auscultation, nonlabored. Mild crackles in the bases.   Cardiac: No elevated JVP or bruits. IRRR, no gallop or rub.   Abdomen: Normoactive bowel sounds, nontender, nondistended.  Extremities: No pitting edema, distal pulses are diminished. Left 4th toe blackened, malodorous.   Lab Results:  Basic Metabolic Panel:  Recent Labs Lab 10/04/14 1846  10/06/14 0512 10/07/14 0627 10/08/14 0607  NA  --   < > 141 144 143  K  --   < > 4.4 3.8 3.9  CL  --   < > 109 108 105  CO2  --   < > 24 29 28   GLUCOSE  --   < > 87 90 90  BUN  --   < > 21* 16 17  CREATININE  --   < > 1.10 1.05 1.08  CALCIUM  --   < > 8.7* 8.9 9.0  MG 1.9  --   --   --   --   < > = values in this interval not displayed.   CBC:  Recent Labs Lab 10/03/14 1129 10/05/14 0520 10/06/14 0512  WBC 7.6 8.5 9.6  HGB 12.2* 10.3* 9.7*  HCT 37.1* 31.5* 30.0*  MCV 83.4 83.8 84.7  PLT 262 265 249    Coagulation:  Recent Labs Lab 10/06/14 0512 10/07/14 0627 10/08/14 0607  INR 2.16* 2.12* 2.27*    Medications:   Scheduled Medications: .  amoxicillin  500 mg Oral Q12H  . donepezil  10 mg Oral QHS  . febuxostat  40 mg Oral Daily  . feeding supplement (ENSURE ENLIVE)  237 mL Oral BID BM  . fluticasone  2 spray Each Nare Daily  . folic acid  1 mg Oral Daily  . furosemide  20 mg Oral Daily  . gabapentin  100 mg Oral BID  . isosorbide mononitrate  15 mg Oral Daily  . levalbuterol  0.63 mg Nebulization Q8H  . metoprolol succinate  37.5 mg Oral BID  . multivitamin with minerals  1 tablet Oral Daily  . mupirocin ointment  1 application Nasal BID  . simvastatin  10 mg Oral Daily  . tamsulosin  0.4 mg Oral QPC breakfast  . thiamine  100 mg Oral Daily   Or  . thiamine  100 mg Intravenous Daily  . vancomycin  1,250 mg Intravenous Q24H  . Warfarin - Pharmacist Dosing Inpatient   Does not apply Q24H  Infusions: . sodium chloride 10 mL/hr at 10/06/14 1258    PRN Medications: acetaminophen **OR** acetaminophen, alum & mag hydroxide-simeth, food thickener, guaiFENesin-dextromethorphan, levalbuterol, metoprolol, morphine injection, nitroGLYCERIN, ondansetron **OR** ondansetron (ZOFRAN) IV, oxyCODONE   Assessment and Plan:   1. Chronic Systolic CHF: Breathing is non-labored. He is positive 4.6 liters with hydration during hospital stay. Lasix, losartan, and spironolactone were held initially in light of acute renal failure. Current medications include Toprol XL, Imdur, and low-dose Lasix was recently restarted.  2. LBBB with NSVT: Dr. Harl Bowie increased his metoprolol to 37.5 mg BID yesterday. HR remains elevated with PVCs. Will increase to 50 mg BID.   3. Anemia: Hgb has dropped from 10.3 to 9.7. On coumadin for DVT. INR 2,27. Hemoccult stools.   4. ETOH Abuse:  Was drinking moonshine at home. Still some confusion but doubt DT's. Thought to be related to pain medications.   Phill Myron. Lawrence NP [place]  10/08/2014, 8:22 AM    Attending note:  Patient seen and examined. Reviewed recent hospital course and above evaluation by  Ms. Lawrence NP. Patient and family member at bedside are under the impression that he will be going home later this afternoon as per Dr. Caryn Section. Current cardiac medications include Toprol-XL, Imdur, Lasix which was recently restarted, and Zocor. He denies any chest pain. History is somewhat limited by confusion, family member at bedside states that he seems somewhat better today. Intake is greater than output with hydration, although renal function has improved significantly. Recent INR 2.2. He is noted to be progressively anemic. Multiple comorbidities being addressed as per Dr. Maralyn Sago note from June 27. On examination he appears comfortable, provides limited history. Lungs exhibit diminished breath sounds, cardiac exam with occasional ectopic beats and no gallop. From a cardiac perspective, would recommend continuing Toprol-XL, dose increased to 50 mg twice daily as tolerated, continue low-dose Lasix for now. May be able to add back losartan later as an outpatient depending on renal function, but would hold off on spironolactone for the time being. We will go ahead and make a follow-up visit with Dr. Percival Spanish so that this is in place whenever he is ready to go home per the primary team.  Satira Sark, M.D., F.A.C.C.

## 2014-10-08 NOTE — Discharge Summary (Signed)
Physician Discharge Summary  Scott [name] VXB:[id number] DOB: 1922-02-08 DOA: 10/03/2014  PCP: Redge Gainer, MD  Admit date: 10/03/2014 Discharge date: 10/08/2014  Time spent: Greater than 30  minutes  Recommendations for Outpatient Follow-up:  1. Home health services were arranged for physical therapy. Hospice plans to visit the patient at home to discuss goals of care, particularly if he decides against amputation.  2. Patient will need his INR reassessed in 3-5 days and on a regular basis thereafter (his INR was supratherapeutic on admission).  Discharge Diagnoses:  1. Gangrene of the left fifth toe in the setting of multivessel atherosclerotic PAD with ulceration between the left fourth and fifth toes secondary to ischemic ulcer and mild cellulitis of the left leg. 2. Sepsis and mild lactic acidosis likely secondary to the left fifth toe gangrene and possible UTI. 3. Hypothermic secondary to sepsis. 4. Aspiration pneumonia. 5. Tachyarrhythmia in the setting of chronic left bundle branch block and frequent PVCs. 6. History of nonischemic cardiomyopathy/chronic systolic heart failure with improvement in his EF. -EF is now 50-55% per echo. 7. Warfarin-induced coagulopathy. Patient's INR was 7.27 on admission and 2.27 at the time of discharge. 8. Acute kidney injury secondary to prerenal azotemia and possibly ATN. Patient's creatinine on admission was 2.83 and 1.08 at discharge. 9. Hyperkalemia secondary to acute renal failure, Aldactone, potassium supplementation, and losartan. Patient's serum potassium was 6.0 on admission and 3.9 at discharge. 10. Normocytic anemia. 11. Acute encephalopathy with agitation, secondary to toxic metabolic encephalopathy.  Discharge Condition: Stable with improvement, but chronically ill.   Diet recommendation: Dysphagia 2 heart healthy.  Filed Weights   10/05/14 0500 10/06/14 0500 10/07/14 0620  Weight: 70.9 kg (156 lb 4.9 oz) 70.9 kg (156 lb 4.9 oz)  70.6 kg (155 lb 10.3 oz)    History of present illness:   Scott [name] is a 81 y.o. 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 He presented from home with a report of generalized weakness, malaise, poor appetite, decrease in responsiveness, and worsening skin ulcer between the fourth and fifth toes on the left foot. He has also had progressive left foot pain. He was recently evaluated by the wound care clinic in [place] and then subsequently by vascular surgeon, Dr. Kellie Simmering for the progressive PAD and chronic skin ulceration between the left fourth and fifth toe.Marland Kitchen Per his recent note on 09/24/14, Dr. Kellie Simmering recommended an above-the-knee amputation; he did not believe the patient was a candidate for open bypass procedure.  In the ED, the patient was hypothermic with a temperature of 96.6 and intermittently tachycardic with a heart rate ranging from 100 to 130s and with a blood pressure ranging from 330-076 systolically. He was oxygenating 98%. His EKG revealed atrial fibrillation with RVR and other chronic changes. CT of his head revealed no acute intracranial findings, but with chronic white matter microvascular disease. X-ray of the fifth toe on the left foot revealed diffuse soft tissue swelling  with air in the soft tissues. His urinalysis revealed many bacteria. His lab data were significant for a sodium of 134, potassium of 6.0, BUN of 74, creatinine of 2.83, lactic acid of 1.3-2.3, normal WBC, and hemoglobin of 12.2. His INR was 7.27. He was admitted for further evaluation and management.   Hospital Course:   Gangrene of the left fifth  toe, in the setting of atherosclerotic PVD with ulceration between the left fourth and fifth toes/ischemic  ulcer, and mild cellulitis of the left leg.  The patient was recently evaluated by vascular surgeon, Dr. Kellie Simmering on 09/24/14. He recommended to the patient and the patient's family a left above-the-knee amputation. He did not believe the patient was a candidate for open bypass procedure given his extensive occlusion of the superficial femoral popliteal and tibial vessels-angioplasty and stenting would not be successful, particularly in the setting of recent renal insufficiency. On admission, the x-ray of his foot revealed diffuse soft tissue swelling involving the fifth toe with air in the soft tissues, consistent with gangrene. Septic arthritis or osteomyelitis could not be ruled out.The patient was started on vancomycin and Zosyn. Blood cultures were ordered and were negative to date. MRI of his foot was not ordered as it would not have changed the management. - Ultimately, the patient needs an amputation as stated by Dr. Kellie Simmering. Apparently, he discussed this with the patient's son and daughter-in-law who is an Doctor, general practice in [place] on 09/24/14.  -Updated the patient's son Scott [name] and daughter-in-law, Dr. Wynetta Emery regarding his progress on 6/25 and multiple times with his son Scott [name].Scott [name] prefered that we continue to treat the patient medically and he will decide on a referral back to Dr. Kellie Simmering following discharge. I did give him the option of transferring him to [facility] if an urgent amputation was needed or electively if this with the patient and family wanted. -Ultimately Scott [name] wanted the patient to make the decision, but the patient appeared to not what the amputation. Scott [name] wants to get a second opinion and discuss this decision further with the patient's PCP. -I recommended hospice or palliative care if the patient opted not to go through with the amputation for pain management and end-of-life goals. -At the time of discharge, the left leg cellulitis resolved, but the gangrene remained.  He was treated with IV Zosyn and vancomycin daily up until discharge. He was continued on antibiotics following discharge with amoxicillin for 7 more days. Aspiration pneumonia. Patient was noted to have pulmonary crackles on exam on 6/25. Follow-up chest x-ray revealed worsening right perihilar and bibasal or opacities right greater than left. Given the patient's altered mental status and possible emesis prior to admission, it is likely that he had aspirated and the infiltrate fluffed out with hydration. He was continued on vancomycin and Zosyn for treatment. His diet was downgraded to dysphagia 2 with nectar liquids. Xopenex was added for bronchodilation. Speech therapy consulted and agreed with the dysphagia 2 diet, but upgraded his liquids to clear liquids as he had improved clinically. -At the time of discharge, he had significantly fewer pulmonary crackles/wheezes and he was oxygenating in the mid 90s on room air. Sepsis and mild lactic acidosis likely secondary to left fifth toe gangrene and UTI.  The patient's lactic acid was initially 1.3, but increased to 3.2.  Blood cultures and a urine culture results were negative to date. Clinically, sepsis has resolved. Hypothermia, secondary to sepsis.  His temperature on admission was 96.6. It improved and normalized. Acute kidney injury.  One year ago, the patient's creatinine was 1.1-1.5. On admission, it is 2.83. Lasix, losartan, and spironolactone were initially held and he was hydrated with IV fluids. He had some chest congestion on 6/25, so he was given one IV dose of Lasix and his fluids were decreased. His creatinine had improved progressively and normalized.  Etiology likely prerenal or  possibly ATN from the infection and hypovolemia and/or from chronic ARB treatment.  Hyperkalemia.  Patient's serum potassium was 6.0 on admission. He was treated with bicarbonate, Kayexalate, and calcium carbonate. His serum potassium normalized.  Bicarbonate was discontinued in the IV fluids. Etiology was likely secondary to outpatient potassium supplementation, spironolactone, and losartan. Warfarin-induced coagulopathy. The patient is treated with Coumadin chronically for his history of bilateral DVT and PE. His INR was 7.27 on admission. It had increased to 9.29. Although there were no sign of bleeding, I choose to give him 2.5 mg of vitamin K subcutaneous empirically. His INR became therapeutic and stayed therapeutic for 3 days ranging from 2.16-2.27 at the time of discharge. He was discharged on 5 mg daily which was less than the dose he was taking at home. Recommend close follow-up of his INR. Tachyarrhythmia. It was thought that the patient had PAF with rapid ventricular response versus wide complex tachycardia. He has a history of nonsustained V. tach and frequent PVCs. He was deemed not to be an ICD candidate by cardiology in the past. As needed IV metoprolol and subsequently oral Toprol XL was restarted. He was given IV fluid hydration. All of these measures helped as his heart rate has decreased. TSH was within normal limits. Cardiology was consulted. Per cardiologist, Dr. Haroldine Laws, the patient has baseline left bundle branch block with runs of NSVT. He agreed with restarting Toprol-XL with titration up to 50 mg twice a day, the patient's home dose as tolerated. -Patient's heart rate improved progressively and normalized at discharge. Nonischemic cardiomyopathy/chronic systolic heart failure.  He is treated chronically with losartan, Lasix, spironolactone, and metoprolol. These were held on admission secondary to above. Follow-up 2-D echocardiogram on 6/24 revealed an EF of 50-55% and grade 1 diastolic dysfunction. This is compared to ejection fraction 20-25% per echo 04/2013. On admission, he appeared to be volume depleted, so he was started on IV fluid hydration. His chest x-ray on admission revealed no acute findings. Follow-up  chest x-ray on 6/25 revealed right greater than left perihilar and bibasilar opacities, which are thought to be secondary to aspiration pneumonia. He was given one 20 mg IV dose of Lasix and started on Xopenex. His lung exam improved. Cardiology recommended restarting oral Lasix at a lower dose and continuing Toprol-XL 50 mg daily. Losartan and/Aldactone could be restarted at a later date, but it could be decided upon in the outpatient setting by his primary cardiologist.  Acute encephalopathy/agitation. The patient has been intermittently agitated, mostly at night and during the early morning hours, which may be secondary to sundowning. He is treated with Aricept, so he likely has mild dementia. However, cardiology nurse practitioner, Ms. Lawrence gathered information from the caretaker, that the patient has a history of alcohol use/abuse. The patient and his wife was questioned about this and acknowledged that he had drunk alcohol in the past, but not in a long while. Nevertheless, vitamin therapy and as needed Ativan were ordered. TSH and vitamin B12 were assessed and both were within normal limits. -Patient's son and other family members stated that the patient has not drunk much alcohol in over a year. Sales promotion account executive for redirection. -Likely etiology was toxic metabolic encephalopathy. The patient became more alert and oriented throughout the hospitalization. Normocytic anemia.  Patient's hemoglobin was 12.2 on admission. It has trended downward to 9.7. This decrease was thought to be secondary to hemodilution. There were no signs of GI or GU bleeding. He is on Coumadin chronically, so Pepcid  was added empirically.    Procedures:  10/04/2014 - 2-D echocardiogram : Study conclusions- Left ventricle: Abnormal septal motion The cavity size was normal. There was mild concentric hypertrophy. Systolic function was normal. The estimated ejection fraction was in the range of 50% to 55%.  Doppler parameters are consistent with abnormal left ventricular relaxation (grade 1 diastolic dysfunction). - Aortic valve: There was trivial regurgitation. - Mitral valve: Calcified annulus.  Consultations:  Cardiology  Discharge Exam: Filed Vitals:   10/08/14 0918  BP:   Pulse: 94  Temp:   Resp: 17   temperature 97.5. Pulse 94. Respiratory rate 17. Blood pressure 152/84. Oxygen saturation 96%.  General: Elderly African-American man who is more alert today and in no acute distress.  Cardiovascular: S1, S2, with occasional ectopy  Respiratory: Lungs clear anteriorly with decreased breath sounds in the bases; resolution of pulmonary crackles.  Abdomen: positive bowel sounds, soft, nontender, nondistended.  Musculoskeletal/extremities: Left lower extremity with significantly less edema at approximately trace- 1+ and 1+ to 2+ left pedal edema; resolved erythema of the pretibial area of the left lower extremity; the left fifth toe is gangrenous and cool to touch; there is a deep fissure between the left fourth and fifth toes with no visible drainage currently; no palpable pulse on the left lower extremity. Right lower extremity without edema and with a palpable pulse.   Discharge Instructions   Discharge Instructions    Diet - low sodium heart healthy    Complete by:  As directed      Discharge instructions    Complete by:  As directed   Mr. Maberry will need his Coumadin level-INR/PT-rechecked in 3-5 days. His Coumadin dosing has been changed.     Increase activity slowly    Complete by:  As directed           Current Discharge Medication List    START taking these medications   Details  amoxicillin (AMOXIL) 500 MG capsule Take 1 capsule (500 mg total) by mouth every 12 (twelve) hours. Antibiotic to be taken for 7 more days Qty: 14 capsule, Refills: 0      CONTINUE these medications which have CHANGED   Details  warfarin (COUMADIN) 2.5 MG tablet Take two tablets  each evening until further notified by the physician who is monitoring your blood work.      CONTINUE these medications which have NOT CHANGED   Details  donepezil (ARICEPT) 10 MG tablet TAKE ONE TABLET AT BEDTIME Qty: 30 tablet, Refills: 2    fluticasone (FLONASE) 50 MCG/ACT nasal spray Place 2 sprays into both nostrils daily. Qty: 16 g, Refills: 6   Associated Diagnoses: Gait disturbance    furosemide (LASIX) 40 MG tablet 40 MG ON MON, WED, AND FRI'S; THEN 20 MG ALL OTHER DAYS Qty: 30 tablet, Refills: 5   Associated Diagnoses: Chronic systolic heart failure    gabapentin (NEURONTIN) 100 MG capsule TAKE (2) CAPSULES TWICE DAILY. Qty: 120 capsule, Refills: 2    HORSE CHESTNUT PO Take 1 capsule by mouth daily.    HYDROcodone-acetaminophen (NORCO/VICODIN) 5-325 MG per tablet Take 1 tablet by mouth every 6 (six) hours as needed for moderate pain or severe pain. Qty: 60 tablet, Refills: 0    isosorbide mononitrate (IMDUR) 30 MG 24 hr tablet Take 0.5 tablets (15 mg total) by mouth daily. Qty: 30 tablet, Refills: 5   Associated Diagnoses: Coronary artery disease involving native coronary artery of native heart without angina pectoris    lidocaine (XYLOCAINE)  2 % jelly APPLY AS NEEDED TO THE LEFT FOOD ULCER Refills: 2    metoprolol succinate (TOPROL-XL) 50 MG 24 hr tablet TAKE (1) TABLET TWICE A DAY. Qty: 60 tablet, Refills: 0    nitroGLYCERIN (NITROSTAT) 0.4 MG SL tablet Place 1 tablet (0.4 mg total) under the tongue every 5 (five) minutes as needed for chest pain (up to 3 doses). Qty: 25 tablet, Refills: 3    potassium chloride SA (K-DUR,KLOR-CON) 20 MEQ tablet Take 1 tablet (20 mEq total) by mouth daily. Qty: 90 tablet, Refills: 1    simvastatin (ZOCOR) 10 MG tablet TAKE 1 TABLET DAILY Qty: 30 tablet, Refills: 4    tamsulosin (FLOMAX) 0.4 MG CAPS capsule TAKE (1) CAPSULE DAILY Qty: 30 capsule, Refills: 5    ULORIC 40 MG tablet TAKE 1 TABLET DAILY Qty: 30 tablet, Refills: 4     megestrol (MEGACE) 20 MG tablet TAKE 1 TABLET ONCE A DAY Qty: 30 tablet, Refills: 3      STOP taking these medications     losartan (COZAAR) 25 MG tablet      spironolactone (ALDACTONE) 25 MG tablet      doxycycline (VIBRA-TABS) 100 MG tablet      meclizine (ANTIVERT) 32 MG tablet      mupirocin ointment (BACTROBAN) 2 %        Allergies  Allergen Reactions  . Lipitor [Atorvastatin] Other (See Comments)    unknown   Follow-up Information    Follow up with [facility].   Contact information:   [address] [place] [address] [phone]       Follow up with Redge Gainer, MD.   Specialty:  Family Medicine   Why:  As scheduled.   Contact information:   [place] [address] [phone]       Follow up with Minus Breeding, MD. Schedule an appointment as soon as possible for a visit in 2 weeks.   Specialty:  Cardiology   Contact information:   [place] [address] [phone]        The results of significant diagnostics from this hospitalization (including imaging, microbiology, ancillary and laboratory) are listed below for reference.    Significant Diagnostic Studies: Dg Chest 1 View  10/03/2014   CLINICAL DATA:  Chest congestion and shortness of breath. The patient reports declining health for 3 months.  EXAM: CHEST  1 VIEW  COMPARISON:  PA and lateral chest 03/26/2014.  FINDINGS: The lungs are clear. Heart size is upper normal. No pneumothorax or pleural effusion. No focal bony abnormality.  IMPRESSION: No acute disease.   Electronically Signed   By: Inge Rise M.D.   On: 10/03/2014 17:36   Ct Head Wo Contrast  10/03/2014   CLINICAL DATA:  Altered mental status decline for 3 months.  EXAM: CT HEAD WITHOUT CONTRAST  TECHNIQUE: Contiguous axial images were obtained from the base of the skull through the vertex without intravenous contrast.  COMPARISON:  CT 07/10/2013  FINDINGS: No acute  intracranial hemorrhage. No focal mass lesion. No CT evidence of acute infarction. No midline shift or mass effect. No hydrocephalus. Basilar cisterns are patent.  Remote infarction in the left centrum semiovale. There is generalized cortical atrophy. Partial ventricular dilatation. Mild periventricular white matter hypodensities.  13 mm round lesion in the sella which have has low-density is not changed from prior.  IMPRESSION: 1. No acute intracranial findings. 2. Chronic atrophy and white matter microvascular disease.  3. 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 4. Stable low-density cystic lesion in the sella   Electronically Signed   By: Suzy Bouchard M.D.   On: 10/03/2014 14:16   Dg Chest Port 1 View  10/05/2014   CLINICAL DATA:  Shortness of breath, cough, congestion, wheezing.  EXAM: PORTABLE CHEST - 1 VIEW  COMPARISON:  10/03/2014; 03/26/2014; 03/14/2013 ; chest CT- 04/10/2013  FINDINGS: Grossly unchanged enlarged cardiac silhouette and mediastinal contours given decreased lung volumes and patient rotation. Atherosclerotic plaque within thoracic aorta. There is persistent thickening of the right paratracheal stripe secondary to prominent vasculature as demonstrated on prior chest CT performed 03/2013. Pulmonary venous congestion without frank evidence of edema. Worsening bibasilar and right perihilar opacities, right greater than left. No definite pleural effusion or pneumothorax. Unchanged bones.  IMPRESSION: Worsening right perihilar and bibasilar opacities, right greater than left, possibly atelectasis fell aspiration and/or infection could have a similar appearance. Further evaluation with a PA and lateral chest radiograph may be obtained as clinically indicated.   Electronically Signed   By: Sandi Mariscal M.D.   On: 10/05/2014 08:59   Dg Foot Complete Left  09/12/2014   CLINICAL DATA:  81 year old male with a history of pain and open wound on foot.  EXAM: LEFT FOOT - COMPLETE 3+ VIEW   COMPARISON:  07/30/2014  FINDINGS: No evidence of acute bony abnormality. No erosive changes of the foot.  Soft tissue swelling of the forefoot. Questionable gas within the soft tissues on the AP view between the fourth and fifth digits.  Extensive vascular calcifications of the anterior tibial artery and posterior tibial artery extending into the pedal arteries.  IMPRESSION: No acute bony abnormality. No erosive changes of the bones to suggest osteomyelitis.  Soft tissue swelling on the dorsum of the foot with questionable gas in the soft tissues interposed between the fourth and fifth digits. The should be amenable to direct inspection.  Extensive vascular calcifications compatible with infrapopliteal disease/pedal disease. If the patient has not yet been evaluated for claudication/CLI, noninvasive testing with ABI, segmental duplex, and segmental pulse volume recording may be considered as well as vascular evaluation.  Signed,  Dulcy Fanny. Earleen Newport, DO  [facility]  [facility]   Electronically Signed   By: Corrie Mckusick D.O.   On: 09/12/2014 17:30   Dg Toe 5th Left  10/03/2014   CLINICAL DATA:  One month history of persistent skin ulceration. Pain and swelling.  EXAM: DG TOE 5TH LEFT  COMPARISON:  09/12/2014  FINDINGS: Knee PIP joint appears slightly narrowed compared to the prior study. There is air surrounding the joint and there is slight cortical irregularity along the medial aspect of the distal aspect of the proximal phalanx. Could not exclude septic arthritis and osteomyelitis. MRI is recommended for further evaluation.  IMPRESSION: Diffuse soft tissue swelling involving the fifth toe with air in the soft tissues.  Could not exclude septic arthritis and osteomyelitis centered at the PIP joint of the fifth toe. MRI without and with contrast is recommended for further evaluation.   Electronically Signed   By: Marijo Sanes M.D.   On: 10/03/2014 12:53     Microbiology: Recent Results (from the past 240 hour(s))  Blood culture (routine x 2)     Status: None   Collection Time: 10/03/14 11:30 AM  Result Value Ref Range Status   Specimen Description BLOOD RIGHT ANTECUBITAL  Final   Special Requests BOTTLES DRAWN AEROBIC AND ANAEROBIC 6CC  EACH  Final   Culture NO GROWTH 5 DAYS  Final   Report Status 10/08/2014 FINAL  Final  Blood culture (routine x 2)     Status: None   Collection Time: 10/03/14 12:10 PM  Result Value Ref Range Status   Specimen Description BLOOD LEFT ANTECUBITAL  Final   Special Requests BOTTLES DRAWN AEROBIC ONLY 8CC  Final   Culture NO GROWTH 5 DAYS  Final   Report Status 10/08/2014 FINAL  Final  Urine culture     Status: None   Collection Time: 10/03/14 12:10 PM  Result Value Ref Range Status   Specimen Description URINE, CATHETERIZED  Final   Special Requests NONE  Final   Culture   Final    NO GROWTH 2 DAYS Performed at [facility]    Report Status 10/05/2014 FINAL  Final  MRSA PCR Screening     Status: Abnormal   Collection Time: 10/03/14  5:27 PM  Result Value Ref Range Status   MRSA by PCR POSITIVE (A) NEGATIVE Final    Comment:        The GeneXpert MRSA Assay (FDA approved for NASAL specimens only), is one component of a comprehensive MRSA colonization surveillance program. It is not intended to diagnose MRSA infection nor to guide or monitor treatment for MRSA infections. RESULT CALLED TO, READ BACK BY AND VERIFIED WITH: SMITH J AT 2200 ON [id number] BY FORSYTH K      Labs: Basic Metabolic Panel:  Recent Labs Lab 10/04/14 0450 10/04/14 1846 10/05/14 0520 10/06/14 0512 10/07/14 0627 10/08/14 0607  NA 139  --  139 141 144 143  K 4.9  --  4.1 4.4 3.8 3.9  CL 106  --  106 109 108 105  CO2 23  --  26 24 29 28   GLUCOSE 101*  --  94 87 90 90  BUN 54*  --  29* 21* 16 17  CREATININE 1.74*  --  1.16 1.10 1.05 1.08  CALCIUM 8.9  --  8.7* 8.7* 8.9 9.0  MG  --  1.9  --   --   --   --     Liver Function Tests:  Recent Labs Lab 10/04/14 0450  AST 32  ALT 26  ALKPHOS 112  BILITOT 0.7  PROT 6.7  ALBUMIN 3.3*   No results for input(s): LIPASE, AMYLASE in the last 168 hours. No results for input(s): AMMONIA in the last 168 hours. CBC:  Recent Labs Lab 10/03/14 1129 10/05/14 0520 10/06/14 0512  WBC 7.6 8.5 9.6  NEUTROABS 6.1  --   --   HGB 12.2* 10.3* 9.7*  HCT 37.1* 31.5* 30.0*  MCV 83.4 83.8 84.7  PLT 262 265 249   Cardiac Enzymes: No results for input(s): CKTOTAL, CKMB, CKMBINDEX, TROPONINI in the last 168 hours. BNP: BNP (last 3 results)  Recent Labs  03/26/14 1511  BNP 137.5*    ProBNP (last 3 results) No results for input(s): PROBNP in the last 8760 hours.  CBG: No results for input(s): GLUCAP in the last 168 hours.     Signed:  Nivaan Dicenzo  [facility] Hospitalists 10/08/2014, 1:37 PM

## 2014-10-09 ENCOUNTER — Other Ambulatory Visit: Payer: Self-pay | Admitting: Family Medicine

## 2014-10-09 ENCOUNTER — Ambulatory Visit: Payer: Medicare Other | Admitting: Family Medicine

## 2014-10-09 ENCOUNTER — Telehealth: Payer: Self-pay | Admitting: *Deleted

## 2014-10-09 ENCOUNTER — Encounter: Payer: Self-pay | Admitting: Family Medicine

## 2014-10-09 VITALS — BP 132/62 | HR 72 | Temp 98.5°F | Ht 70.0 in

## 2014-10-09 DIAGNOSIS — I739 Peripheral vascular disease, unspecified: Secondary | ICD-10-CM

## 2014-10-09 DIAGNOSIS — L97529 Non-pressure chronic ulcer of other part of left foot with unspecified severity: Secondary | ICD-10-CM | POA: Diagnosis not present

## 2014-10-09 DIAGNOSIS — R748 Abnormal levels of other serum enzymes: Secondary | ICD-10-CM

## 2014-10-09 DIAGNOSIS — L03116 Cellulitis of left lower limb: Secondary | ICD-10-CM

## 2014-10-09 DIAGNOSIS — M79672 Pain in left foot: Secondary | ICD-10-CM | POA: Diagnosis not present

## 2014-10-09 DIAGNOSIS — I70269 Atherosclerosis of native arteries of extremities with gangrene, unspecified extremity: Secondary | ICD-10-CM | POA: Diagnosis not present

## 2014-10-09 DIAGNOSIS — I4891 Unspecified atrial fibrillation: Secondary | ICD-10-CM | POA: Diagnosis not present

## 2014-10-09 DIAGNOSIS — R7989 Other specified abnormal findings of blood chemistry: Secondary | ICD-10-CM

## 2014-10-09 MED ORDER — HYDROCODONE-ACETAMINOPHEN 5-325 MG PO TABS: ORAL_TABLET | ORAL | Status: DC

## 2014-10-09 NOTE — Progress Notes (Signed)
Subjective:    Patient ID: Scott [name], male    DOB: [birth year], 81 y.o.   MRN: [id number]  HPI Patient seen today in home for follow up from hospital. He has recently seen Dr Kellie Simmering and possible amputation of left lower leg was discussed. He is accompanied today by his wife, son, and 2 caregivers. He is alert and oriented today. The patient has a gangrenous left foot specifically the fourth and fifth toes. He was discharged from the hospital yesterday. His hospitalization included diagnoses of sepsis urinary tract infection pneumonia and gangrene of the left foot. He and his family were consulted with by the hospitalist and surgeons for possible above-the-knee amputation on the left and the decision was not made to do this at this at that  time. Because of all of the problems going on with the patient at that time he was in the hospital he did not have full understanding of the implications of his gangrene and sepsis that could come from that. Today we had a long conversation with him his wife his son and caregivers and he does seem to understand that if he does not have the leg amputated that he will get sick again and will most likely have to have hospice come in because of the infection and his age and this outcome of not doing the amputation. He is feeling better since coming home and is still taking amoxicillin for his pneumonia. He denies any chest pain or shortness of breath and he was alert and communicative and understanding of the situation. His bowels are moving well and is passing his water okay. He is currently not having as much pain in his foot but his son requested that we refill his pain medication in case the pain worsens again. After a long conversation with him and his family his desires are to have the leg amputated above the knee so that he can recover and be at home with his wife and his son. This was discussed on several occasions and he understands what will happen if he doesn't  have the amputation done and he was agreeable to getting this done. As a result of this conversation the son is going to call the surgeon and try to arrange readmission to have this amputation done and taking care of. We will also have home health check a pro time tomorrow.       Patient Active Problem List   Diagnosis Date Noted  . Tachyarrhythmia 10/05/2014  . Aspiration pneumonia 10/05/2014  . UTI (lower urinary tract infection) 10/03/2014  . Sepsis due to other etiology 10/03/2014  . Gangrene of toe 10/03/2014  . Hypothermia 10/03/2014  . Ischemic ulcer of left foot 10/03/2014  . Atherosclerotic PVD with ulceration 10/03/2014  . Warfarin-induced coagulopathy 10/03/2014  . Atrial fibrillation with RVR 10/03/2014  . Acute kidney injury 10/03/2014  . Hyperkalemia 10/03/2014  . Cellulitis of left leg 10/03/2014  . PAD (peripheral artery disease) 09/24/2014  . NSTEMI (non-ST elevated myocardial infarction) 10/09/2013  . High risk medication use 05/17/2013  . BPH (benign prostatic hyperplasia) 05/17/2013  . Vitamin D deficiency 05/17/2013  . Chronic anticoagulation- subtheraputic INR on admission 04/19/2013  . Acute pulmonary embolism 04/17/2013  . NSVT (nonsustained ventricular tachycardia)- not an ICD candidate 04/17/2013  . NICM (nonischemic cardiomyopathy)- EF 20-25% by Echo 04/12/13 04/17/2013  . Elevated troponin 04/10/2013  . Chronic systolic heart failure 09/32/3557  . DVT of lower extremity, bilateral 03/15/2013  . Fatigue 11/02/2012  .  Constipation 11/02/2012  . Alcohol abuse 08/27/2012  . Syncope 06/21/2012  . Bladder cancer 03/15/2012  . Benign localized hyperplasia of prostate with urinary obstruction 03/15/2012  . HTN (hypertension)   . Hyperlipidemia   . PVCs (premature ventricular contractions)   . CAD- non obstructive disease by cath 3/14    Outpatient Encounter Prescriptions as of 10/09/2014  Medication Sig  . amoxicillin (AMOXIL) 500 MG capsule Take 1  capsule (500 mg total) by mouth every 12 (twelve) hours. Antibiotic to be taken for 7 more days  . donepezil (ARICEPT) 10 MG tablet TAKE ONE TABLET AT BEDTIME  . fluticasone (FLONASE) 50 MCG/ACT nasal spray Place 2 sprays into both nostrils daily.  . furosemide (LASIX) 40 MG tablet 40 MG ON MON, WED, AND FRI'S; THEN 20 MG ALL OTHER DAYS  . gabapentin (NEURONTIN) 100 MG capsule TAKE (2) CAPSULES TWICE DAILY.  Marland Kitchen HORSE CHESTNUT PO Take 1 capsule by mouth daily.  Marland Kitchen HYDROcodone-acetaminophen (NORCO/VICODIN) 5-325 MG per tablet Take 1 tablet by mouth every 6 (six) hours as needed for moderate pain or severe pain.  . isosorbide mononitrate (IMDUR) 30 MG 24 hr tablet Take 0.5 tablets (15 mg total) by mouth daily.  Marland Kitchen lidocaine (XYLOCAINE) 2 % jelly APPLY AS NEEDED TO THE LEFT FOOD ULCER  . megestrol (MEGACE) 20 MG tablet TAKE 1 TABLET ONCE A DAY  . metoprolol succinate (TOPROL-XL) 50 MG 24 hr tablet TAKE (1) TABLET TWICE A DAY.  . nitroGLYCERIN (NITROSTAT) 0.4 MG SL tablet Place 1 tablet (0.4 mg total) under the tongue every 5 (five) minutes as needed for chest pain (up to 3 doses).  . potassium chloride SA (K-DUR,KLOR-CON) 20 MEQ tablet Take 1 tablet (20 mEq total) by mouth daily.  . simvastatin (ZOCOR) 10 MG tablet TAKE 1 TABLET DAILY  . tamsulosin (FLOMAX) 0.4 MG CAPS capsule TAKE (1) CAPSULE DAILY  . ULORIC 40 MG tablet TAKE 1 TABLET DAILY  . warfarin (COUMADIN) 2.5 MG tablet Take two tablets each evening until further notified by the physician who is monitoring your blood work.   No facility-administered encounter medications on file as of 10/09/2014.     Review of Systems  Constitutional: Negative.   HENT: Negative.   Eyes: Negative.   Respiratory: Negative.   Cardiovascular: Positive for leg swelling (left lower leg ).  Gastrointestinal: Negative.   Endocrine: Negative.   Genitourinary: Negative.   Musculoskeletal: Negative.   Skin: Positive for color change (necrotic left great toe  tissue).  Allergic/Immunologic: Negative.   Neurological: Negative.   Hematological: Negative.   Psychiatric/Behavioral: Negative.        Objective:   Physical Exam  Constitutional: He is oriented to person, place, and time. He appears distressed.  Elderly black male examined in his bed at home  HENT:  Head: Normocephalic and atraumatic.  Eyes: Conjunctivae and EOM are normal. Pupils are equal, round, and reactive to light. Right eye exhibits no discharge. Left eye exhibits no discharge. No scleral icterus.  Neck: Normal range of motion.  Cardiovascular: Normal rate and normal heart sounds.   No murmur heard. Distal pulses difficult to palpate Irregular irregular rhythm at 72/m  Pulmonary/Chest: Effort normal. He has no wheezes. He has no rales.  Abdominal: Soft. Bowel sounds are normal. There is no tenderness.  Musculoskeletal: He exhibits edema and tenderness.  Patient sitting in the bed and not walking  Neurological: He is alert and oriented to person, place, and time.  Skin: Skin is warm and dry. There  is erythema.  The patient has swelling and edema of the left foot and on up into the lower leg. The left fifth toe is black and this goes through to the base of the toe and dorsal foot.  Psychiatric: His behavior is normal. Judgment and thought content normal.  Patient is somewhat sad that he has to contemplate doing an amputation of his leg but willing to do this to keep living and to be with his wife  Nursing note and vitals reviewed.  BP 132/62 mmHg  Pulse 72  Temp(Src) 98.5 F (36.9 C) (Oral)  Ht 5\' 10"  (1.778 m)  Wt   Over 1 hour of time was spent talking with this patient about his current situation and where he had been reviewing the hospital notes with his son and his wife and caregivers being present. After discussing what will happen if he does not have the leg amputated he was agreeable to being readmitted to the hospital to have an above the knee amputation done on  the left side. This visit was done in the patient's home while he was sitting in his bed with his family surrounding.      Assessment & Plan:  1. Peripheral vascular insufficiency -Pulses in the patient's lower extremities were not palpable bilaterally.  2. Elevated serum creatinine -The patient's creatinine had improved on discharge from the hospital and we will ask for another BMP to be done in a week if he does not get readmitted to the hospital for amputation of his lower leg.  3. Ulcer of left foot -The ulcer continues to be present on the left foot with a gangrenous fifth toe. There is some bloody drainage.  4. Cellulitis of left foot -There is erythema and swelling across the top of the foot especially closer to the fifth toe.  5. Foot pain, left -The foot pain somewhat diminished today since his hospital discharge.  6. Atrial fibrillation, unspecified -He remains in atrial fibrillation but today was complaining of no shortness of breath.  7. Atherosclerosis of native arteries of the extremities with gangrene -After having a long discussion with the patient and his family he decided he didn't want to have an amputation done above the knee on the left side.  Meds ordered this encounter  Medications  . HYDROcodone-acetaminophen (NORCO/VICODIN) 5-325 MG per tablet    Sig: Take 1/2 to a whole tab QID PRN for severe pain    Dispense:  60 tablet    Refill:  0   Patient Instructions  The son is to call the surgeon and try to arrange for the patient to be readmitted for an amputation on the left above the knee. The patient should continue taking his amoxicillin as he is doing until it is completed He should keep his leg up when possible to reduce the amount of swelling in his left foot. He should try to eat well and drink plenty of fluids while he is still at home.    Arrie Senate MD

## 2014-10-09 NOTE — Patient Instructions (Signed)
The son is to call the surgeon and try to arrange for the patient to be readmitted for an amputation on the left above the knee. The patient should continue taking his amoxicillin as he is doing until it is completed He should keep his leg up when possible to reduce the amount of swelling in his left foot. He should try to eat well and drink plenty of fluids while he is still at home.

## 2014-10-09 NOTE — Telephone Encounter (Signed)
Please review and advise.

## 2014-10-09 NOTE — Telephone Encounter (Signed)
Order for PT-INR given for 10/10/14 - will call report to [place] or [facility].

## 2014-10-10 ENCOUNTER — Telehealth: Payer: Self-pay

## 2014-10-10 ENCOUNTER — Telehealth: Payer: Self-pay | Admitting: Family Medicine

## 2014-10-10 DIAGNOSIS — Z87891 Personal history of nicotine dependence: Secondary | ICD-10-CM | POA: Diagnosis not present

## 2014-10-10 DIAGNOSIS — Z7901 Long term (current) use of anticoagulants: Secondary | ICD-10-CM | POA: Diagnosis not present

## 2014-10-10 DIAGNOSIS — Z86718 Personal history of other venous thrombosis and embolism: Secondary | ICD-10-CM | POA: Diagnosis not present

## 2014-10-10 DIAGNOSIS — Z5181 Encounter for therapeutic drug level monitoring: Secondary | ICD-10-CM | POA: Diagnosis not present

## 2014-10-10 DIAGNOSIS — I480 Paroxysmal atrial fibrillation: Secondary | ICD-10-CM | POA: Diagnosis not present

## 2014-10-10 DIAGNOSIS — I10 Essential (primary) hypertension: Secondary | ICD-10-CM | POA: Diagnosis not present

## 2014-10-10 DIAGNOSIS — I5022 Chronic systolic (congestive) heart failure: Secondary | ICD-10-CM | POA: Diagnosis not present

## 2014-10-10 DIAGNOSIS — I70262 Atherosclerosis of native arteries of extremities with gangrene, left leg: Secondary | ICD-10-CM | POA: Diagnosis not present

## 2014-10-10 DIAGNOSIS — L97529 Non-pressure chronic ulcer of other part of left foot with unspecified severity: Secondary | ICD-10-CM | POA: Diagnosis not present

## 2014-10-10 MED ORDER — ENOXAPARIN SODIUM 80 MG/0.8ML ~~LOC~~ SOLN: 80.0000 mg | Freq: Two times a day (BID) | SUBCUTANEOUS | Status: DC

## 2014-10-10 NOTE — Telephone Encounter (Signed)
Patient's last warfarin dose will be tonight.  Start Lovenox 80mg  SQ BID tomorrow night.  Continue until evening of July 4th.  Patient's son notified.  Also requested INR from home health and education teaching care giver how to injection lovenox.

## 2014-10-10 NOTE — Telephone Encounter (Signed)
Phone call returned to pt's. son.  Requested to schedule pt. for the left AKA as soon as possible.  Stated his father has made the decision to proceed.  Noted the pt. Is on Coumadin.  Discussed with Dr. Kellie Simmering.  Recommended to hold Coumadin x 5 days, and to clear this with Dr. Redge Gainer.  Also recommended to contact the [facility] and arrange for pt. to be admitted under their service on 10/15/14, and schedule (L) AKA on 10/16/14.   Phone call placed to [place]; requested to speak to nurse re: Coumadin being held 5 days prior to surgery; advised will receive a call back re: this.

## 2014-10-10 NOTE — Telephone Encounter (Signed)
rec'd phone call from Dr. Tawanna Sat office nurse.  Reported Dr. Laurance Flatten recommends to bridge pt. with Lovenox, while off Coumadin .  Nurse for Dr. Laurance Flatten stated that she will order the Lovenox, and contact the pt's son, to give instructions on giving Lovenox.  Advised nurse that Dr. Kellie Simmering recommends pt. stop Coumadin after today; 5 days prior to surgery.  Left AKA will be scheduled for 10/16/14.  Verb. Understanding.

## 2014-10-10 NOTE — Telephone Encounter (Signed)
Patient is at moderate risk of blood clot is stops warfarin due to history of bilateral DVT 03/2013 and then recurrance of VTE of PE 04/2013.   Recommend Levenox bridging therapy while off warfarin.

## 2014-10-11 ENCOUNTER — Telehealth: Payer: Self-pay | Admitting: Pharmacist

## 2014-10-11 DIAGNOSIS — L97529 Non-pressure chronic ulcer of other part of left foot with unspecified severity: Secondary | ICD-10-CM | POA: Diagnosis not present

## 2014-10-11 DIAGNOSIS — I10 Essential (primary) hypertension: Secondary | ICD-10-CM | POA: Diagnosis not present

## 2014-10-11 DIAGNOSIS — I70262 Atherosclerosis of native arteries of extremities with gangrene, left leg: Secondary | ICD-10-CM | POA: Diagnosis not present

## 2014-10-11 DIAGNOSIS — I5022 Chronic systolic (congestive) heart failure: Secondary | ICD-10-CM | POA: Diagnosis not present

## 2014-10-11 DIAGNOSIS — Z86718 Personal history of other venous thrombosis and embolism: Secondary | ICD-10-CM | POA: Diagnosis not present

## 2014-10-11 DIAGNOSIS — I480 Paroxysmal atrial fibrillation: Secondary | ICD-10-CM | POA: Diagnosis not present

## 2014-10-11 NOTE — Telephone Encounter (Signed)
Spoke with caregiver.  She states that [place] did come to home today and showed them how in inject Lovenox.  First dose has been administered.  She is unsure however if INR was checked.  Spoke with Levada Dy from [facility] but found out that Arbie Cookey was actually who visited Scott [name] today.  Tried to call Arbie Cookey to see if INR was done but had to LM.   Arbie Cookey returned call.  She did not do INR.  She states she could do either tomorrow or Monday.  Patient has pre op on Tuesday, July 5th and they will check INR then.  I don't see reason to do at this point.

## 2014-10-11 NOTE — Telephone Encounter (Signed)
Arbie Cookey aware of all instructions per T. Eckard.

## 2014-10-14 DIAGNOSIS — L97529 Non-pressure chronic ulcer of other part of left foot with unspecified severity: Secondary | ICD-10-CM | POA: Diagnosis not present

## 2014-10-14 DIAGNOSIS — Z86718 Personal history of other venous thrombosis and embolism: Secondary | ICD-10-CM | POA: Diagnosis not present

## 2014-10-14 DIAGNOSIS — I480 Paroxysmal atrial fibrillation: Secondary | ICD-10-CM | POA: Diagnosis not present

## 2014-10-14 DIAGNOSIS — I5022 Chronic systolic (congestive) heart failure: Secondary | ICD-10-CM | POA: Diagnosis not present

## 2014-10-14 DIAGNOSIS — I70262 Atherosclerosis of native arteries of extremities with gangrene, left leg: Secondary | ICD-10-CM | POA: Diagnosis not present

## 2014-10-14 DIAGNOSIS — I10 Essential (primary) hypertension: Secondary | ICD-10-CM | POA: Diagnosis not present

## 2014-10-15 ENCOUNTER — Inpatient Hospital Stay (HOSPITAL_COMMUNITY)
Admission: RE | Admit: 2014-10-15 | Discharge: 2014-10-18 | DRG: 240 | Disposition: A | Discharge status: Rehabilitation Facility | Payer: Medicare Other | Source: Ambulatory Visit | Attending: Internal Medicine | Admitting: Internal Medicine

## 2014-10-15 ENCOUNTER — Encounter (HOSPITAL_COMMUNITY): Payer: Self-pay | Admitting: General Practice

## 2014-10-15 ENCOUNTER — Inpatient Hospital Stay (HOSPITAL_COMMUNITY): Payer: Medicare Other

## 2014-10-15 DIAGNOSIS — I5032 Chronic diastolic (congestive) heart failure: Secondary | ICD-10-CM

## 2014-10-15 DIAGNOSIS — Z79891 Long term (current) use of opiate analgesic: Secondary | ICD-10-CM | POA: Diagnosis not present

## 2014-10-15 DIAGNOSIS — I2510 Atherosclerotic heart disease of native coronary artery without angina pectoris: Secondary | ICD-10-CM | POA: Diagnosis present

## 2014-10-15 DIAGNOSIS — M1389 Other specified arthritis, multiple sites: Secondary | ICD-10-CM | POA: Diagnosis present

## 2014-10-15 DIAGNOSIS — F0391 Unspecified dementia with behavioral disturbance: Secondary | ICD-10-CM | POA: Diagnosis present

## 2014-10-15 DIAGNOSIS — I252 Old myocardial infarction: Secondary | ICD-10-CM

## 2014-10-15 DIAGNOSIS — Z8551 Personal history of malignant neoplasm of bladder: Secondary | ICD-10-CM | POA: Diagnosis not present

## 2014-10-15 DIAGNOSIS — I493 Ventricular premature depolarization: Secondary | ICD-10-CM | POA: Diagnosis present

## 2014-10-15 DIAGNOSIS — I129 Hypertensive chronic kidney disease with stage 1 through stage 4 chronic kidney disease, or unspecified chronic kidney disease: Secondary | ICD-10-CM | POA: Diagnosis present

## 2014-10-15 DIAGNOSIS — Z8249 Family history of ischemic heart disease and other diseases of the circulatory system: Secondary | ICD-10-CM

## 2014-10-15 DIAGNOSIS — N184 Chronic kidney disease, stage 4 (severe): Secondary | ICD-10-CM | POA: Diagnosis present

## 2014-10-15 DIAGNOSIS — M549 Dorsalgia, unspecified: Secondary | ICD-10-CM | POA: Diagnosis present

## 2014-10-15 DIAGNOSIS — Z01818 Encounter for other preprocedural examination: Secondary | ICD-10-CM

## 2014-10-15 DIAGNOSIS — I5042 Chronic combined systolic (congestive) and diastolic (congestive) heart failure: Secondary | ICD-10-CM | POA: Diagnosis not present

## 2014-10-15 DIAGNOSIS — I5020 Unspecified systolic (congestive) heart failure: Secondary | ICD-10-CM | POA: Diagnosis not present

## 2014-10-15 DIAGNOSIS — D649 Anemia, unspecified: Secondary | ICD-10-CM | POA: Diagnosis present

## 2014-10-15 DIAGNOSIS — G8929 Other chronic pain: Secondary | ICD-10-CM | POA: Diagnosis present

## 2014-10-15 DIAGNOSIS — I10 Essential (primary) hypertension: Secondary | ICD-10-CM | POA: Diagnosis not present

## 2014-10-15 DIAGNOSIS — I70262 Atherosclerosis of native arteries of extremities with gangrene, left leg: Principal | ICD-10-CM | POA: Diagnosis present

## 2014-10-15 DIAGNOSIS — Z86718 Personal history of other venous thrombosis and embolism: Secondary | ICD-10-CM | POA: Diagnosis not present

## 2014-10-15 DIAGNOSIS — F329 Major depressive disorder, single episode, unspecified: Secondary | ICD-10-CM | POA: Diagnosis present

## 2014-10-15 DIAGNOSIS — I4891 Unspecified atrial fibrillation: Secondary | ICD-10-CM | POA: Diagnosis not present

## 2014-10-15 DIAGNOSIS — L98499 Non-pressure chronic ulcer of skin of other sites with unspecified severity: Secondary | ICD-10-CM | POA: Diagnosis present

## 2014-10-15 DIAGNOSIS — L97529 Non-pressure chronic ulcer of other part of left foot with unspecified severity: Secondary | ICD-10-CM | POA: Diagnosis not present

## 2014-10-15 DIAGNOSIS — Z7901 Long term (current) use of anticoagulants: Secondary | ICD-10-CM | POA: Diagnosis not present

## 2014-10-15 DIAGNOSIS — Z888 Allergy status to other drugs, medicaments and biological substances status: Secondary | ICD-10-CM | POA: Diagnosis not present

## 2014-10-15 DIAGNOSIS — E785 Hyperlipidemia, unspecified: Secondary | ICD-10-CM | POA: Diagnosis present

## 2014-10-15 DIAGNOSIS — I82403 Acute embolism and thrombosis of unspecified deep veins of lower extremity, bilateral: Secondary | ICD-10-CM | POA: Diagnosis present

## 2014-10-15 DIAGNOSIS — Z9079 Acquired absence of other genital organ(s): Secondary | ICD-10-CM | POA: Diagnosis present

## 2014-10-15 DIAGNOSIS — Z79899 Other long term (current) drug therapy: Secondary | ICD-10-CM

## 2014-10-15 DIAGNOSIS — Z87891 Personal history of nicotine dependence: Secondary | ICD-10-CM

## 2014-10-15 DIAGNOSIS — I428 Other cardiomyopathies: Secondary | ICD-10-CM | POA: Diagnosis not present

## 2014-10-15 DIAGNOSIS — Z906 Acquired absence of other parts of urinary tract: Secondary | ICD-10-CM | POA: Diagnosis present

## 2014-10-15 DIAGNOSIS — Z66 Do not resuscitate: Secondary | ICD-10-CM | POA: Diagnosis present

## 2014-10-15 DIAGNOSIS — I724 Aneurysm of artery of lower extremity: Secondary | ICD-10-CM | POA: Diagnosis present

## 2014-10-15 DIAGNOSIS — F0390 Unspecified dementia without behavioral disturbance: Secondary | ICD-10-CM | POA: Diagnosis present

## 2014-10-15 DIAGNOSIS — N400 Enlarged prostate without lower urinary tract symptoms: Secondary | ICD-10-CM | POA: Diagnosis present

## 2014-10-15 DIAGNOSIS — I998 Other disorder of circulatory system: Secondary | ICD-10-CM | POA: Diagnosis present

## 2014-10-15 DIAGNOSIS — L97929 Non-pressure chronic ulcer of unspecified part of left lower leg with unspecified severity: Secondary | ICD-10-CM | POA: Diagnosis not present

## 2014-10-15 DIAGNOSIS — E8809 Other disorders of plasma-protein metabolism, not elsewhere classified: Secondary | ICD-10-CM | POA: Diagnosis present

## 2014-10-15 DIAGNOSIS — R627 Adult failure to thrive: Secondary | ICD-10-CM | POA: Diagnosis present

## 2014-10-15 DIAGNOSIS — I700 Atherosclerosis of aorta: Secondary | ICD-10-CM | POA: Diagnosis not present

## 2014-10-15 DIAGNOSIS — Z86711 Personal history of pulmonary embolism: Secondary | ICD-10-CM | POA: Diagnosis present

## 2014-10-15 DIAGNOSIS — F1021 Alcohol dependence, in remission: Secondary | ICD-10-CM | POA: Diagnosis present

## 2014-10-15 DIAGNOSIS — D62 Acute posthemorrhagic anemia: Secondary | ICD-10-CM | POA: Diagnosis not present

## 2014-10-15 DIAGNOSIS — I70209 Unspecified atherosclerosis of native arteries of extremities, unspecified extremity: Secondary | ICD-10-CM | POA: Diagnosis present

## 2014-10-15 DIAGNOSIS — H409 Unspecified glaucoma: Secondary | ICD-10-CM | POA: Diagnosis present

## 2014-10-15 DIAGNOSIS — Z89612 Acquired absence of left leg above knee: Secondary | ICD-10-CM | POA: Diagnosis not present

## 2014-10-15 DIAGNOSIS — I5022 Chronic systolic (congestive) heart failure: Secondary | ICD-10-CM | POA: Diagnosis present

## 2014-10-15 HISTORY — DX: Other chronic pain: G89.29

## 2014-10-15 HISTORY — DX: Low back pain, unspecified: M54.50

## 2014-10-15 HISTORY — DX: Low back pain: M54.5

## 2014-10-15 LAB — SURGICAL PCR SCREEN
MRSA, PCR: POSITIVE — AB
Staphylococcus aureus: POSITIVE — AB

## 2014-10-15 LAB — CBC
HCT: 29.1 % — ABNORMAL LOW (ref 39.0–52.0)
Hemoglobin: 9.4 g/dL — ABNORMAL LOW (ref 13.0–17.0)
MCH: 26.8 pg (ref 26.0–34.0)
MCHC: 32.3 g/dL (ref 30.0–36.0)
MCV: 82.9 fL (ref 78.0–100.0)
Platelets: 359 10*3/uL (ref 150–400)
RBC: 3.51 MIL/uL — ABNORMAL LOW (ref 4.22–5.81)
RDW: 14.8 % (ref 11.5–15.5)
WBC: 7.1 10*3/uL (ref 4.0–10.5)

## 2014-10-15 LAB — COMPREHENSIVE METABOLIC PANEL
ALT: 43 U/L (ref 17–63)
AST: 45 U/L — ABNORMAL HIGH (ref 15–41)
Albumin: 3 g/dL — ABNORMAL LOW (ref 3.5–5.0)
Alkaline Phosphatase: 98 U/L (ref 38–126)
Anion gap: 7 (ref 5–15)
BUN: 31 mg/dL — ABNORMAL HIGH (ref 6–20)
CO2: 26 mmol/L (ref 22–32)
Calcium: 9 mg/dL (ref 8.9–10.3)
Chloride: 106 mmol/L (ref 101–111)
Creatinine, Ser: 1.48 mg/dL — ABNORMAL HIGH (ref 0.61–1.24)
GFR calc Af Amer: 46 mL/min — ABNORMAL LOW (ref >60)
GFR calc non Af Amer: 40 mL/min — ABNORMAL LOW (ref >60)
Glucose, Bld: 109 mg/dL — ABNORMAL HIGH (ref 65–99)
Potassium: 4.2 mmol/L (ref 3.5–5.1)
Sodium: 139 mmol/L (ref 135–145)
Total Bilirubin: 0.3 mg/dL (ref 0.3–1.2)
Total Protein: 6.7 g/dL (ref 6.5–8.1)

## 2014-10-15 LAB — TYPE AND SCREEN
ABO/RH(D): A POS
Antibody Screen: NEGATIVE

## 2014-10-15 LAB — PROTIME-INR
INR: 1.93 — ABNORMAL HIGH (ref 0.00–1.49)
Prothrombin Time: 22 seconds — ABNORMAL HIGH (ref 11.6–15.2)

## 2014-10-15 LAB — ABO/RH: ABO/RH(D): A POS

## 2014-10-15 LAB — GLUCOSE, CAPILLARY: Glucose-Capillary: 81 mg/dL (ref 65–99)

## 2014-10-15 LAB — MAGNESIUM: Magnesium: 1.9 mg/dL (ref 1.7–2.4)

## 2014-10-15 MED ORDER — NALOXONE HCL 0.4 MG/ML IJ SOLN: 0.4000 mg | INTRAMUSCULAR | Status: DC | PRN

## 2014-10-15 MED ORDER — ENOXAPARIN SODIUM 80 MG/0.8ML ~~LOC~~ SOLN: 80.0000 mg | Freq: Two times a day (BID) | SUBCUTANEOUS | Status: DC

## 2014-10-15 MED ORDER — AMOXICILLIN 500 MG PO CAPS
500.0000 mg | ORAL_CAPSULE | Freq: Two times a day (BID) | ORAL | Status: DC
  Filled 2014-10-15 (×3): qty 1

## 2014-10-15 MED ORDER — DONEPEZIL HCL 10 MG PO TABS
10.0000 mg | ORAL_TABLET | Freq: Every day | ORAL | Status: DC
  Administered 2014-10-17: 10 mg via ORAL
  Filled 2014-10-15 (×4): qty 1

## 2014-10-15 MED ORDER — DOCUSATE SODIUM 100 MG PO CAPS
100.0000 mg | ORAL_CAPSULE | Freq: Two times a day (BID) | ORAL | Status: DC
  Administered 2014-10-17 – 2014-10-18 (×3): 100 mg via ORAL
  Filled 2014-10-15 (×7): qty 1

## 2014-10-15 MED ORDER — ONDANSETRON HCL 4 MG/2ML IJ SOLN: 4.0000 mg | Freq: Four times a day (QID) | INTRAMUSCULAR | Status: DC | PRN

## 2014-10-15 MED ORDER — ONDANSETRON HCL 4 MG/2ML IJ SOLN
4.0000 mg | Freq: Four times a day (QID) | INTRAMUSCULAR | Status: DC | PRN
Start: 2014-10-15 — End: 2014-10-15

## 2014-10-15 MED ORDER — MEGESTROL ACETATE 20 MG PO TABS: 20.0000 mg | ORAL_TABLET | Freq: Every day | ORAL | Status: DC

## 2014-10-15 MED ORDER — ENOXAPARIN SODIUM 80 MG/0.8ML ~~LOC~~ SOLN
1.0000 mg/kg | Freq: Two times a day (BID) | SUBCUTANEOUS | Status: AC
  Administered 2014-10-15: 70 mg via SUBCUTANEOUS
  Filled 2014-10-15: qty 0.8

## 2014-10-15 MED ORDER — ISOSORBIDE MONONITRATE 15 MG HALF TABLET
15.0000 mg | ORAL_TABLET | Freq: Every day | ORAL | Status: DC
  Administered 2014-10-17 – 2014-10-18 (×2): 15 mg via ORAL
  Filled 2014-10-15 (×3): qty 1

## 2014-10-15 MED ORDER — DIPHENHYDRAMINE HCL 12.5 MG/5ML PO ELIX
12.5000 mg | ORAL_SOLUTION | Freq: Four times a day (QID) | ORAL | Status: DC | PRN
  Filled 2014-10-15: qty 5

## 2014-10-15 MED ORDER — DEXTROSE-NACL 5-0.9 % IV SOLN
INTRAVENOUS | Status: DC
  Administered 2014-10-15: 50 mL/h via INTRAVENOUS

## 2014-10-15 MED ORDER — GABAPENTIN 100 MG PO CAPS
200.0000 mg | ORAL_CAPSULE | Freq: Two times a day (BID) | ORAL | Status: DC
  Administered 2014-10-17 – 2014-10-18 (×3): 200 mg via ORAL
  Filled 2014-10-15 (×7): qty 2

## 2014-10-15 MED ORDER — ONDANSETRON HCL 4 MG PO TABS: 4.0000 mg | ORAL_TABLET | Freq: Four times a day (QID) | ORAL | Status: DC | PRN

## 2014-10-15 MED ORDER — DEXTROSE 5 % IV SOLN
1.5000 g | INTRAVENOUS | Status: AC
  Administered 2014-10-16: 1.5 g via INTRAVENOUS
  Filled 2014-10-15: qty 1.5

## 2014-10-15 MED ORDER — SODIUM CHLORIDE 0.9 % IJ SOLN
3.0000 mL | Freq: Two times a day (BID) | INTRAMUSCULAR | Status: DC
  Administered 2014-10-15 – 2014-10-18 (×4): 3 mL via INTRAVENOUS

## 2014-10-15 MED ORDER — TAMSULOSIN HCL 0.4 MG PO CAPS
0.4000 mg | ORAL_CAPSULE | Freq: Every day | ORAL | Status: DC
  Administered 2014-10-15 – 2014-10-17 (×2): 0.4 mg via ORAL
  Filled 2014-10-15 (×4): qty 1

## 2014-10-15 MED ORDER — MORPHINE SULFATE 2 MG/ML IJ SOLN
2.0000 mg | INTRAMUSCULAR | Status: DC | PRN
  Administered 2014-10-16 – 2014-10-17 (×4): 2 mg via INTRAVENOUS
  Filled 2014-10-15 (×4): qty 1

## 2014-10-15 MED ORDER — FEBUXOSTAT 40 MG PO TABS
40.0000 mg | ORAL_TABLET | Freq: Every day | ORAL | Status: DC
  Administered 2014-10-17 – 2014-10-18 (×2): 40 mg via ORAL
  Filled 2014-10-15 (×3): qty 1

## 2014-10-15 MED ORDER — DIPHENHYDRAMINE HCL 50 MG/ML IJ SOLN: 12.5000 mg | Freq: Four times a day (QID) | INTRAMUSCULAR | Status: DC | PRN

## 2014-10-15 MED ORDER — THIAMINE HCL 100 MG/ML IJ SOLN
100.0000 mg | Freq: Every day | INTRAMUSCULAR | Status: DC
  Administered 2014-10-15 – 2014-10-18 (×3): 100 mg via INTRAVENOUS
  Filled 2014-10-15 (×4): qty 1

## 2014-10-15 MED ORDER — METOPROLOL SUCCINATE ER 50 MG PO TB24
50.0000 mg | ORAL_TABLET | Freq: Every day | ORAL | Status: DC
  Administered 2014-10-17 – 2014-10-18 (×2): 50 mg via ORAL
  Filled 2014-10-15 (×3): qty 1

## 2014-10-15 MED ORDER — MORPHINE SULFATE 1 MG/ML IV SOLN
INTRAVENOUS | Status: DC
  Administered 2014-10-15: 0 mg via INTRAVENOUS
  Administered 2014-10-15: 17:00:00 via INTRAVENOUS
  Filled 2014-10-15: qty 25

## 2014-10-15 MED ORDER — ACETAMINOPHEN 650 MG RE SUPP: 650.0000 mg | Freq: Four times a day (QID) | RECTAL | Status: DC | PRN

## 2014-10-15 MED ORDER — FOLIC ACID 5 MG/ML IJ SOLN
1.0000 mg | Freq: Every day | INTRAMUSCULAR | Status: DC
  Administered 2014-10-15 – 2014-10-18 (×3): 1 mg via INTRAVENOUS
  Filled 2014-10-15 (×4): qty 0.2

## 2014-10-15 MED ORDER — ALUM & MAG HYDROXIDE-SIMETH 200-200-20 MG/5ML PO SUSP: 30.0000 mL | Freq: Four times a day (QID) | ORAL | Status: DC | PRN

## 2014-10-15 MED ORDER — SODIUM CHLORIDE 0.9 % IJ SOLN: 9.0000 mL | INTRAMUSCULAR | Status: DC | PRN

## 2014-10-15 MED ORDER — SIMVASTATIN 10 MG PO TABS
10.0000 mg | ORAL_TABLET | Freq: Every day | ORAL | Status: DC
  Administered 2014-10-17 – 2014-10-18 (×2): 10 mg via ORAL
  Filled 2014-10-15 (×3): qty 1

## 2014-10-15 MED ORDER — ACETAMINOPHEN 325 MG PO TABS: 650.0000 mg | ORAL_TABLET | Freq: Four times a day (QID) | ORAL | Status: DC | PRN

## 2014-10-15 NOTE — H&P (Signed)
[facility] History and Physical                                                                                    Scott [name], is a 81 y.o. male  MRN: [id number]   DOB - 1921-09-04  Admit Date - 10/15/2014  Outpatient Primary MD for the patient is Scott Gainer, MD  Referring MD: Kellie Simmering / [facility]  With History of -  Past Medical History  Diagnosis Date  . HTN (hypertension)   . Hyperlipidemia   . PVCs (premature ventricular contractions)   . Glaucoma   . PVD (peripheral vascular disease)     Right ABI .75, Left .78 (2006)  . Popliteal artery aneurysm, bilateral DECUMENTED CHRONIC PARTIAL OCCLUSION--  PT DENIES CLAUDICATION OR ANY OTHER SYMPTOMS  . Frequency of urination   . Nocturia   . Bladder cancer 03/15/2012  . Chronic systolic CHF (congestive heart failure)     a. NICM - patent cors 06/2012, EF 40% at that time. b. 03/2013 eval: EF 20-25%.  . DVT, bilateral lower limbs 03/2013    a. Bilat DVT dx 03/2013.  . Pulmonary embolism     a. By CT angio 04/2013.  Marland Kitchen Elevated troponin     a. Adm 12/30-04/2013 - not felt to represent ACS; ?due to PE. b. Patent cors 06/2012.  Marland Kitchen NSVT (nonsustained ventricular tachycardia)     a. NSVT 06/2012; NSVT also seen during 03/2013 adm. b. Med rx. Not candidate for ICD given adv age.  Marland Kitchen ETOH abuse   . Syncope     a. Felt to be postural syncope related to diuretics 06/2012.  Marland Kitchen Heart murmur   . DVT (deep venous thrombosis)     "I think in both legs"  . Pneumonia     "couple times"  . DDD (degenerative disc disease)   . Arthritis     "all over"  . Chronic back pain   . Depression   . CAD- non obstructive disease by cath 3/14   . PAD (peripheral artery disease)   . Gangrene of toe 10/03/2014  . Sepsis due to other etiology 10/03/2014  . Chronic systolic heart failure 10/09/50    EF improved 50-55%      Past Surgical History  Procedure Laterality Date  . Transthoracic echocardiogram  09-22-2010    MODERATE LVH/ EF 84%/ GRADE I DIASTOLIC  DYSFUNCTION/ AORTIC SCLEROSIS WITHOUT STENOSIS/  RV  SYSTOLIC  MILDLY REDUCED FUNCTION  . Cardiovascular stress test  10-15-2010    LOW RISK NUCLEAR STUDY/ NO EVIDENCE OF ISCHEMIA/ NORMAL EF  . Shoulder surgery  1970's    "don't remember which side or what kind of OR"  . Transurethral resection of bladder tumor  10/07/2011    Procedure: TRANSURETHRAL RESECTION OF BLADDER TUMOR (TURBT);  Surgeon: Malka So, MD;  Location: [facility];  Service: Urology;  Laterality: N/A;  . Cystoscopy  10/07/2011    Procedure: CYSTOSCOPY;  Surgeon: Malka So, MD;  Location: [facility];  Service: Urology;  Laterality: N/A;  . Cystoscopy with biopsy  03/14/2012    Procedure: CYSTOSCOPY WITH BIOPSY;  Surgeon: Marshall Cork  Jeffie Pollock, MD;  Location: [facility];  Service: Urology;  Laterality: N/A;  WITH FULGURATION  . Transurethral resection of prostate  03/14/2012    Procedure: TRANSURETHRAL RESECTION OF THE PROSTATE WITH GYRUS INSTRUMENTS;  Surgeon: Malka So, MD;  Location: [facility];  Service: Urology;  Laterality: N/A;  . Tonsillectomy    . Cataract extraction w/ intraocular lens  implant, bilateral Bilateral   . Cardiac catheterization  05-11-2004  DR [place] PLAQUE/ NORMAL LVF/ EF 55%/  NON-OBSTRUCTIVE LAD 25%  . Cardiac catheterization  06/2012  . Eye surgery      in for painful ischemic ulcer left foot with planned surgical intervention on 7/6    HPI [name] history of severe peripheral vascular disease, history of nonischemic cardiomyopathy with EF 20-25% in December 2015 but as of June 2016 EF now up to 50-55% with grade 1 diastolic dysfunction, history of DVT with PE in 2014 on chronic warfarin, normocytic anemia and history of bladder cancer/BPH. He was most recently hospitalized and subsequently discharged on 6/28 after treatment for sepsis related to gangrenous ulcers toes left foot. He was also found to have aspiration pneumonitis,  acute toxic metabolic encephalopathy and warfarin-induced coagulopathy in setting of acute kidney injury. He was started on amoxicillin and because of his acute renal failure with hyperkalemia presentation his Cozaar and Aldactone were discontinued. During the hospitalization he was evaluated by vascular surgery/Dr. Kellie Simmering who recommended an above the knee amputation noting patient was not a candidate for an open bypass procedure. Patient's son (who lives out of state in [place]) was responsible for making medical decisions for his father during the last hospitalization. At that time medical therapy was opted for as opposed to surgical intervention. The son opted to allow the patient to make a decision regarding amputation and at that time the patient apparently did not desire to proceed with surgical intervention. At time of discharge it was documented that the son wanted to get a second opinion and discuss further with the patient's PCP. The discharging physician recommended hospice or palliative care if the patient opted not to go through with the amputation, primarily for pain management and to discuss end-of-life goals. It appears that since discharge a decision has been made to proceed with surgical intervention and the patient is thus being admitted to the medical team to prepare for surgical procedure i.e. AKA on 7/6 by Dr. Kellie Simmering. Patient's Coumadin has been on hold for 5 days and he has been on Lovenox bridge due to his history of DVT/PE.  Upon my evaluation of the patient after arrival to this hospital he primarily is complaining of uncontrollable pain in the left leg and foot. His caretaker who has been with him for 3 years was at the bedside and assisting the history.   Review of Systems   In addition to the HPI above,  No Fever-chills, myalgias or other constitutional symptoms No Headache, changes with Vision or hearing, new weakness, tingling, numbness in any extremity, No problems swallowing  food or Liquids, indigestion/reflux No Chest pain, Cough or Shortness of Breath, palpitations, orthopnea or DOE No Abdominal pain, N/V; no melena or hematochezia, no dark tarry stools, Bowel movements are regular, No dysuria, hematuria or flank pain No new skin rashes, lesions, masses or bruises No recent weight gain or loss No polyuria, polydypsia or polyphagia,  *A full 10 point Review of Systems was done, except as stated above, all other Review of  Systems were negative.  Social History History  Substance Use Topics  . Smoking status: Former Smoker -- 1.00 packs/day for 20 years    Types: Cigarettes    Start date: 04/12/1942    Quit date: 04/13/1975  . Smokeless tobacco: Never Used  . Alcohol Use: No     Comment: 10/09/2013 "quit drinking in early June 2015"    Resides at: Private residence  Lives with: Wife with assistance of home health aide caretaker  Ambulatory status: Nonambulatory secondary to pain and left foot from peripheral vascular disease and ulcers   Family History Family History  Problem Relation Age of Onset  . Hypertension Mother   . Arthritis Mother   . Lung cancer Sister   . Deep vein thrombosis Father   . Early death Brother      Prior to Admission medications   Medication Sig Start Date End Date Taking? Authorizing Provider  amoxicillin (AMOXIL) 500 MG capsule Take 1 capsule (500 mg total) by mouth every 12 (twelve) hours. Antibiotic to be taken for 7 more days 10/08/14   Rexene Alberts, MD  donepezil (ARICEPT) 10 MG tablet TAKE ONE TABLET AT BEDTIME 07/10/14   Chipper Herb, MD  enoxaparin (LOVENOX) 80 MG/0.8ML injection Inject 0.8 mLs (80 mg total) into the skin every 12 (twelve) hours. 10/10/14   Tammy Eckard, PHARMD  fluticasone (FLONASE) 50 MCG/ACT nasal spray Place 2 sprays into both nostrils daily. 03/26/14   Chipper Herb, MD  furosemide (LASIX) 40 MG tablet 40 MG ON MON, WED, AND FRI'S; THEN 20 MG ALL OTHER DAYS 10/18/13   Liliane Shi, PA-C    gabapentin (NEURONTIN) 100 MG capsule TAKE (2) CAPSULES TWICE DAILY. 10/09/14   Chipper Herb, MD  HORSE CHESTNUT PO Take 1 capsule by mouth daily.    Historical Provider, MD  HYDROcodone-acetaminophen (NORCO/VICODIN) 5-325 MG per tablet Take 1/2 to a whole tab QID PRN for severe pain 10/09/14   Chipper Herb, MD  isosorbide mononitrate (IMDUR) 30 MG 24 hr tablet Take 0.5 tablets (15 mg total) by mouth daily. 10/18/13   Liliane Shi, PA-C  lidocaine (XYLOCAINE) 2 % jelly APPLY AS NEEDED TO THE LEFT FOOD ULCER 09/27/14   Historical Provider, MD  megestrol (MEGACE) 20 MG tablet TAKE 1 TABLET ONCE A DAY 03/20/14   Lysbeth Penner, FNP  metoprolol succinate (TOPROL-XL) 50 MG 24 hr tablet TAKE (1) TABLET TWICE A DAY. 09/26/14   Minus Breeding, MD  nitroGLYCERIN (NITROSTAT) 0.4 MG SL tablet Place 1 tablet (0.4 mg total) under the tongue every 5 (five) minutes as needed for chest pain (up to 3 doses). 04/19/13   Dayna N Dunn, PA-C  potassium chloride SA (K-DUR,KLOR-CON) 20 MEQ tablet Take 1 tablet (20 mEq total) by mouth daily. 09/04/14   Chipper Herb, MD  simvastatin (ZOCOR) 10 MG tablet TAKE 1 TABLET DAILY 07/25/14   Chipper Herb, MD  tamsulosin [facility]) 0.4 MG CAPS capsule TAKE (1) CAPSULE DAILY 09/20/14   Chipper Herb, MD  ULORIC 40 MG tablet TAKE 1 TABLET DAILY 06/19/14   Chipper Herb, MD  warfarin (COUMADIN) 2.5 MG tablet Take two tablets each evening until further notified by the physician who is monitoring your blood work. 10/08/14   Rexene Alberts, MD    Allergies  Allergen Reactions  . Lipitor [Atorvastatin] Other (See Comments)    unknown    Physical Exam  Vitals  There were no vitals taken for this visit. VITALS  PENDING AT TIME OF NOTE   General:  In no acute distress, appears chronically ill and younger than stated age  Psych:  Normal affect, Denies Suicidal or Homicidal ideations, Awake Alert, Oriented X 3. Speech and thought patterns are clear and appropriate, has minor short  term memory deficits  Neuro:   No focal neurological deficits, CN II through XII intact, Strength 5/5 all 4 extremities except for left lower stroma D which strength testing has been limited by severe pain in left foot, Sensation intact all 4 extremities.  ENT:  Ears and Eyes appear Normal, Conjunctivae clear, PER. Moist oral mucosa without erythema or exudates.  Neck:  Supple, No lymphadenopathy appreciated  Respiratory:  Symmetrical chest wall movement, Good air movement bilaterally, CTAB. Room Air  Cardiac:  RRR, No Murmurs, 2+ bilateral LE edema, no JVD, No carotid bruits, peripheral pulses palpable at 2+  Abdomen:  Positive bowel sounds, Soft, Non tender, Non distended,  No masses appreciated, no obvious hepatosplenomegaly  Skin:  No Cyanosis, Normal Skin Turgor, No Skin Rash or Bruise. Patient does have necrotic left pinky toe with associated edema and skin maceration to the adjacent #3 and 4 toes with malodorous drainage  Extremities: Symmetrical with skin ulcers and necrosis as described above,  no joint effusions.  Data Review  CBC No results for input(s): WBC, HGB, HCT, PLT, MCV, MCH, MCHC, RDW, LYMPHSABS, MONOABS, EOSABS, BASOSABS, BANDABS in the last 168 hours.  Invalid input(s): NEUTRABS, BANDSABD  Chemistries  No results for input(s): NA, K, CL, CO2, GLUCOSE, BUN, CREATININE, CALCIUM, MG, AST, ALT, ALKPHOS, BILITOT in the last 168 hours.  Invalid input(s): GFRCGP  estimated creatinine clearance is 44.5 mL/min (by C-G formula based on Cr of 1.08).  No results for input(s): TSH, T4TOTAL, T3FREE, THYROIDAB in the last 72 hours.  Invalid input(s): FREET3  Coagulation profile No results for input(s): INR, PROTIME in the last 168 hours.  No results for input(s): DDIMER in the last 72 hours.  Cardiac Enzymes No results for input(s): CKMB, TROPONINI, MYOGLOBIN in the last 168 hours.  Invalid input(s): CK  Invalid input(s): POCBNP  Urinalysis    Component Value  Date/Time   COLORURINE YELLOW 10/03/2014 1210   APPEARANCEUR CLEAR 10/03/2014 1210   LABSPEC 1.020 10/03/2014 1210   PHURINE 5.0 10/03/2014 1210   GLUCOSEU NEGATIVE 10/03/2014 1210   HGBUR TRACE* 10/03/2014 1210   BILIRUBINUR NEGATIVE 10/03/2014 1210   BILIRUBINUR neg 11/02/2012 1307   KETONESUR NEGATIVE 10/03/2014 1210   PROTEINUR NEGATIVE 10/03/2014 1210   PROTEINUR mod 11/02/2012 1307   UROBILINOGEN 0.2 10/03/2014 1210   UROBILINOGEN negative 11/02/2012 1307   NITRITE NEGATIVE 10/03/2014 1210   NITRITE neg 11/02/2012 1307   LEUKOCYTESUR NEGATIVE 10/03/2014 1210    Imaging results:   Dg Chest 1 View  10/03/2014   CLINICAL DATA:  Chest congestion and shortness of breath. The patient reports declining health for 3 months.  EXAM: CHEST  1 VIEW  COMPARISON:  PA and lateral chest 03/26/2014.  FINDINGS: The lungs are clear. Heart size is upper normal. No pneumothorax or pleural effusion. No focal bony abnormality.  IMPRESSION: No acute disease.   Electronically Signed   By: Inge Rise M.D.   On: 10/03/2014 17:36   Ct Head Wo Contrast  10/03/2014   CLINICAL DATA:  Altered mental status decline for 3 months.  EXAM: CT HEAD WITHOUT CONTRAST  TECHNIQUE: Contiguous axial images were obtained from the base of the skull through the vertex without intravenous contrast.  COMPARISON:  CT 07/10/2013  FINDINGS: No acute intracranial hemorrhage. No focal mass lesion. No CT evidence of acute infarction. No midline shift or mass effect. No hydrocephalus. Basilar cisterns are patent.  Remote infarction in the left centrum semiovale. There is generalized cortical atrophy. Partial ventricular dilatation. Mild periventricular white matter hypodensities.  13 mm round lesion in the sella which have has low-density is not changed from prior.  IMPRESSION: 1. No acute intracranial findings. 2. Chronic atrophy and white matter microvascular disease. 3. 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 4. Stable  low-density cystic lesion in the sella   Electronically Signed   By: Suzy Bouchard M.D.   On: 10/03/2014 14:16   Dg Chest Port 1 View  10/05/2014   CLINICAL DATA:  Shortness of breath, cough, congestion, wheezing.  EXAM: PORTABLE CHEST - 1 VIEW  COMPARISON:  10/03/2014; 03/26/2014; 03/14/2013 ; chest CT- 04/10/2013  FINDINGS: Grossly unchanged enlarged cardiac silhouette and mediastinal contours given decreased lung volumes and patient rotation. Atherosclerotic plaque within thoracic aorta. There is persistent thickening of the right paratracheal stripe secondary to prominent vasculature as demonstrated on prior chest CT performed 03/2013. Pulmonary venous congestion without frank evidence of edema. Worsening bibasilar and right perihilar opacities, right greater than left. No definite pleural effusion or pneumothorax. Unchanged bones.  IMPRESSION: Worsening right perihilar and bibasilar opacities, right greater than left, possibly atelectasis fell aspiration and/or infection could have a similar appearance. Further evaluation with a PA and lateral chest radiograph may be obtained as clinically indicated.   Electronically Signed   By: Sandi Mariscal M.D.   On: 10/05/2014 08:59   Dg Toe 5th Left  10/03/2014   CLINICAL DATA:  One month history of persistent skin ulceration. Pain and swelling.  EXAM: DG TOE 5TH LEFT  COMPARISON:  09/12/2014  FINDINGS: Knee PIP joint appears slightly narrowed compared to the prior study. There is air surrounding the joint and there is slight cortical irregularity along the medial aspect of the distal aspect of the proximal phalanx. Could not exclude septic arthritis and osteomyelitis. MRI is recommended for further evaluation.  IMPRESSION: Diffuse soft tissue swelling involving the fifth toe with air in the soft tissues.  Could not exclude septic arthritis and osteomyelitis centered at the PIP joint of the fifth toe. MRI without and with contrast is recommended for further  evaluation.   Electronically Signed   By: Marijo Sanes M.D.   On: 10/03/2014 12:53      Assessment & Plan  Principal Problem:   Ischemic ulcer of left foot secondary to PVD -Admit to telemetry unit -Patient endorsing uncontrolled pain on oral agents so we will utilize full dose PCA morphine preoperatively -Vascular surgery to see patient later this afternoon -Nothing by mouth after midnight for anticipated surgical procedure on 7/6 -Have added D5 normal saline at 50 an hour to begin after midnight once nothing by mouth -Continue preadmission amoxicillin; will defer to [facility] if antibiotic change indicated -Check Cmet/CBC and PT/PTT now -Continue Neurontin for neuropathic pain -We will need PT/OT evaluations once appropriate in the postoperative period  Active Problems:   HTN  -Continue preadmission medications: Toprol    CAD- non obstructive disease by cath 3/14 -Currently asymptomatic -Patient had EKG completed 6/24-can repeat if required by anesthesia    Chronic diastolic heart failure, NYHA class 1 -Appears compensated on exam -Does have bilateral lower extremity edema but suspect this is from reported history of patient sitting with feet dependent most of the  time -In review of previous laboratory data patient with low albumin of 3.3 -Previous systolic heart failure has resolved based on repeat echo last month; unclear at this juncture if any definitive need to continue Lasix and potassium chronically    Hyperlipidemia -Continue Zocor    History of pulmonary embolism in 2014/DVT of lower extremity, bilateral -Coumadin on hold 5 days prior to admission -Check PT/INR -Per request of vascular surgery give 1 dose Lovenox today and then hold in anticipation of surgery 7/6    BPH /history prostate cancer -Continue Flomax   Hypoalbuminemia -Patient has a degree of failure to thrive and documented low albumin -Continue Megace   Dementia -Continue Aricept -Monitor for  acute delirium and setting of use of narcotic pain medications and in the postoperative period especially immediately post anesthesia     DVT Prophylaxis: Lovenox full dose 1 dose on 7/5 at 1600 then hold preoperatively  Family Communication:  No family at bedside   Code Status:  DO NOT RESUSCITATE  Condition:  Stable  Discharge disposition: Unclear if postoperatively patient will be able to return to home environment with caretaker-decision pending postoperative PT/OT evaluation  Time spent in minutes : 60      Bettey Muraoka L. ANP on 10/15/2014 at 3:58 PM  Between 7am to 7pm - Pager - [phone]  After 7pm go to [email or web address] - password [email or web address]  And look for the night coverage person covering me after hours  [facility]

## 2014-10-15 NOTE — Progress Notes (Signed)
ANTICOAGULATION CONSULT NOTE - Initial Consult  Pharmacy Consult for Lovenox Indication: pulmonary embolus and DVT  Allergies  Allergen Reactions  . Lipitor [Atorvastatin] Other (See Comments)    unknown    Patient Measurements:   Heparin Dosing Weight:   Vital Signs:    Labs: No results for input(s): HGB, HCT, PLT, APTT, LABPROT, INR, HEPARINUNFRC, CREATININE, CKTOTAL, CKMB, TROPONINI in the last 72 hours.  Estimated Creatinine Clearance: 44.5 mL/min (by C-G formula based on Cr of 1.08).   Medical History: Past Medical History  Diagnosis Date  . HTN (hypertension)   . Hyperlipidemia   . PVCs (premature ventricular contractions)   . Glaucoma   . PVD (peripheral vascular disease)     Right ABI .75, Left .78 (2006)  . Popliteal artery aneurysm, bilateral DECUMENTED CHRONIC PARTIAL OCCLUSION--  PT DENIES CLAUDICATION OR ANY OTHER SYMPTOMS  . Frequency of urination   . Nocturia   . Bladder cancer 03/15/2012  . Chronic systolic CHF (congestive heart failure)     a. NICM - patent cors 06/2012, EF 40% at that time. b. 03/2013 eval: EF 20-25%.  . DVT, bilateral lower limbs 03/2013    a. Bilat DVT dx 03/2013.  . Pulmonary embolism     a. By CT angio 04/2013.  Marland Kitchen Elevated troponin     a. Adm 12/30-04/2013 - not felt to represent ACS; ?due to PE. b. Patent cors 06/2012.  Marland Kitchen NSVT (nonsustained ventricular tachycardia)     a. NSVT 06/2012; NSVT also seen during 03/2013 adm. b. Med rx. Not candidate for ICD given adv age.  Marland Kitchen ETOH abuse   . Syncope     a. Felt to be postural syncope related to diuretics 06/2012.  Marland Kitchen Heart murmur   . DVT (deep venous thrombosis)     "I think in both legs"  . Pneumonia     "couple times"  . DDD (degenerative disc disease)   . Arthritis     "all over"  . Chronic back pain   . Depression   . CAD- non obstructive disease by cath 3/14   . PAD (peripheral artery disease)   . Gangrene of toe 10/03/2014  . Sepsis due to other etiology 10/03/2014  .  Chronic systolic heart failure 0/08/67    EF improved 50-55%    Medications:  Prescriptions prior to admission  Medication Sig Dispense Refill Last Dose  . amoxicillin (AMOXIL) 500 MG capsule Take 1 capsule (500 mg total) by mouth every 12 (twelve) hours. Antibiotic to be taken for 7 more days 14 capsule 0 Taking  . donepezil (ARICEPT) 10 MG tablet TAKE ONE TABLET AT BEDTIME 30 tablet 2 Taking  . enoxaparin (LOVENOX) 80 MG/0.8ML injection Inject 0.8 mLs (80 mg total) into the skin every 12 (twelve) hours. 7 Syringe 0   . fluticasone (FLONASE) 50 MCG/ACT nasal spray Place 2 sprays into both nostrils daily. 16 g 6 Taking  . furosemide (LASIX) 40 MG tablet 40 MG ON MON, WED, AND FRI'S; THEN 20 MG ALL OTHER DAYS 30 tablet 5 Taking  . gabapentin (NEURONTIN) 100 MG capsule TAKE (2) CAPSULES TWICE DAILY. 120 capsule 0 Taking  . HORSE CHESTNUT PO Take 1 capsule by mouth daily.   Taking  . HYDROcodone-acetaminophen (NORCO/VICODIN) 5-325 MG per tablet Take 1/2 to a whole tab QID PRN for severe pain 60 tablet 0   . isosorbide mononitrate (IMDUR) 30 MG 24 hr tablet Take 0.5 tablets (15 mg total) by mouth daily. 30 tablet 5 Taking  .  lidocaine (XYLOCAINE) 2 % jelly APPLY AS NEEDED TO THE LEFT FOOD ULCER  2 Taking  . megestrol (MEGACE) 20 MG tablet TAKE 1 TABLET ONCE A DAY 30 tablet 3 Taking  . metoprolol succinate (TOPROL-XL) 50 MG 24 hr tablet TAKE (1) TABLET TWICE A DAY. 60 tablet 0 Taking  . nitroGLYCERIN (NITROSTAT) 0.4 MG SL tablet Place 1 tablet (0.4 mg total) under the tongue every 5 (five) minutes as needed for chest pain (up to 3 doses). 25 tablet 3 Taking  . potassium chloride SA (K-DUR,KLOR-CON) 20 MEQ tablet Take 1 tablet (20 mEq total) by mouth daily. 90 tablet 1 Taking  . simvastatin (ZOCOR) 10 MG tablet TAKE 1 TABLET DAILY 30 tablet 4 Taking  . tamsulosin (FLOMAX) 0.4 MG CAPS capsule TAKE (1) CAPSULE DAILY 30 capsule 5 Taking  . ULORIC 40 MG tablet TAKE 1 TABLET DAILY 30 tablet 4 Taking  .  warfarin (COUMADIN) 2.5 MG tablet Take two tablets each evening until further notified by the physician who is monitoring your blood work.   Taking   Scheduled:  . amoxicillin  500 mg Oral Q12H  . docusate sodium  100 mg Oral BID  . donepezil  10 mg Oral QHS  . enoxaparin  80 mg Subcutaneous Q12H  . febuxostat  40 mg Oral Daily  . folic acid  1 mg Intravenous Daily  . gabapentin  200 mg Oral BID  . isosorbide mononitrate  15 mg Oral Daily  . megestrol  20 mg Oral Daily  . metoprolol succinate  50 mg Oral Daily  . morphine   Intravenous 6 times per day  . simvastatin  10 mg Oral Daily  . sodium chloride  3 mL Intravenous Q12H  . tamsulosin  0.4 mg Oral QPC supper  . thiamine  100 mg Intravenous Daily   Infusions:  . [START ON 10/16/2014] dextrose 5 % and 0.9% NaCl      Assessment: 81yo male with history of b/l DVT and PE on coumadin PTA here for AKA tomorrow. Pharmacy is consulted to dose Lovenox for VTE treatment. Pt will undergo AKA tomorrow and will hold warfarin tonight. No new labs.  Goal of Therapy:  Monitor platelets by anticoagulation protocol: Yes   Plan:  Lovenox 1mg /kg subcutaneously for 1 dose Continue to monitor H&H and platelets  F/u post-op for anticoag plan  [name]. Diona Foley, PharmD Clinical Pharmacist Pager [phone] 10/15/2014,3:59 PM

## 2014-10-16 ENCOUNTER — Encounter (HOSPITAL_COMMUNITY)
Admission: RE | Disposition: A | Discharge status: Rehabilitation Facility | Payer: Self-pay | Source: Ambulatory Visit | Attending: Internal Medicine

## 2014-10-16 ENCOUNTER — Inpatient Hospital Stay (HOSPITAL_COMMUNITY): Payer: Medicare Other | Admitting: Anesthesiology

## 2014-10-16 ENCOUNTER — Encounter (HOSPITAL_COMMUNITY): Payer: Self-pay | Admitting: Certified Registered Nurse Anesthetist

## 2014-10-16 ENCOUNTER — Ambulatory Visit: Payer: Medicare Other | Admitting: Cardiology

## 2014-10-16 DIAGNOSIS — L97529 Non-pressure chronic ulcer of other part of left foot with unspecified severity: Secondary | ICD-10-CM

## 2014-10-16 DIAGNOSIS — I70262 Atherosclerosis of native arteries of extremities with gangrene, left leg: Secondary | ICD-10-CM

## 2014-10-16 HISTORY — PX: AMPUTATION: SHX166

## 2014-10-16 LAB — GLUCOSE, CAPILLARY
Glucose-Capillary: 85 mg/dL (ref 65–99)
Glucose-Capillary: 84 mg/dL (ref 65–99)
Glucose-Capillary: 97 mg/dL (ref 65–99)
Glucose-Capillary: 109 mg/dL — ABNORMAL HIGH (ref 65–99)

## 2014-10-16 LAB — CBC
HCT: 28.4 % — ABNORMAL LOW (ref 39.0–52.0)
Hemoglobin: 9.2 g/dL — ABNORMAL LOW (ref 13.0–17.0)
MCH: 26.7 pg (ref 26.0–34.0)
MCHC: 32.4 g/dL (ref 30.0–36.0)
MCV: 82.6 fL (ref 78.0–100.0)
Platelets: 334 10*3/uL (ref 150–400)
RBC: 3.44 MIL/uL — ABNORMAL LOW (ref 4.22–5.81)
RDW: 14.7 % (ref 11.5–15.5)
WBC: 6.4 10*3/uL (ref 4.0–10.5)

## 2014-10-16 LAB — BASIC METABOLIC PANEL
Anion gap: 8 (ref 5–15)
BUN: 25 mg/dL — ABNORMAL HIGH (ref 6–20)
CO2: 24 mmol/L (ref 22–32)
Calcium: 9.4 mg/dL (ref 8.9–10.3)
Chloride: 109 mmol/L (ref 101–111)
Creatinine, Ser: 1.26 mg/dL — ABNORMAL HIGH (ref 0.61–1.24)
GFR calc Af Amer: 56 mL/min — ABNORMAL LOW (ref >60)
GFR calc non Af Amer: 48 mL/min — ABNORMAL LOW (ref >60)
Glucose, Bld: 92 mg/dL (ref 65–99)
Potassium: 5.1 mmol/L (ref 3.5–5.1)
Sodium: 141 mmol/L (ref 135–145)

## 2014-10-16 LAB — PROTIME-INR
INR: 1.86 — ABNORMAL HIGH (ref 0.00–1.49)
Prothrombin Time: 21.4 seconds — ABNORMAL HIGH (ref 11.6–15.2)

## 2014-10-16 LAB — APTT: aPTT: 29 seconds (ref 24–37)

## 2014-10-16 SURGERY — AMPUTATION, ABOVE KNEE
Anesthesia: General | Site: Leg Upper | Laterality: Left

## 2014-10-16 MED ORDER — EPHEDRINE SULFATE 50 MG/ML IJ SOLN
INTRAMUSCULAR | Status: DC | PRN
  Administered 2014-10-16: 15 mg via INTRAVENOUS
  Administered 2014-10-16: 10 mg via INTRAVENOUS

## 2014-10-16 MED ORDER — HYDROMORPHONE HCL 1 MG/ML IJ SOLN
0.2500 mg | INTRAMUSCULAR | Status: DC | PRN
  Administered 2014-10-16 (×3): 0.25 mg via INTRAVENOUS

## 2014-10-16 MED ORDER — GUAIFENESIN-DM 100-10 MG/5ML PO SYRP: 15.0000 mL | ORAL_SOLUTION | ORAL | Status: DC | PRN

## 2014-10-16 MED ORDER — ONDANSETRON HCL 4 MG/2ML IJ SOLN
INTRAMUSCULAR | Status: AC
  Filled 2014-10-16: qty 2

## 2014-10-16 MED ORDER — HALOPERIDOL LACTATE 5 MG/ML IJ SOLN
1.0000 mg | Freq: Once | INTRAMUSCULAR | Status: AC
  Administered 2014-10-16: 1 mg via INTRAVENOUS
  Filled 2014-10-16: qty 0.2

## 2014-10-16 MED ORDER — PROPOFOL 10 MG/ML IV BOLUS
INTRAVENOUS | Status: DC | PRN
  Administered 2014-10-16: 120 mg via INTRAVENOUS

## 2014-10-16 MED ORDER — SODIUM CHLORIDE 0.9 % IV SOLN
INTRAVENOUS | Status: DC | PRN
  Administered 2014-10-16: 08:00:00 via INTRAVENOUS

## 2014-10-16 MED ORDER — OXYCODONE HCL 5 MG PO TABS: 5.0000 mg | ORAL_TABLET | Freq: Once | ORAL | Status: DC | PRN

## 2014-10-16 MED ORDER — WARFARIN - PHARMACIST DOSING INPATIENT: Freq: Every day | Status: DC

## 2014-10-16 MED ORDER — PHENYLEPHRINE 40 MCG/ML (10ML) SYRINGE FOR IV PUSH (FOR BLOOD PRESSURE SUPPORT)
PREFILLED_SYRINGE | INTRAVENOUS | Status: AC
  Filled 2014-10-16: qty 10

## 2014-10-16 MED ORDER — SENNOSIDES-DOCUSATE SODIUM 8.6-50 MG PO TABS
1.0000 | ORAL_TABLET | Freq: Every evening | ORAL | Status: DC | PRN
  Filled 2014-10-16: qty 1

## 2014-10-16 MED ORDER — WARFARIN SODIUM 5 MG PO TABS
5.0000 mg | ORAL_TABLET | Freq: Once | ORAL | Status: AC
  Filled 2014-10-16: qty 1

## 2014-10-16 MED ORDER — DEXAMETHASONE SODIUM PHOSPHATE 4 MG/ML IJ SOLN
INTRAMUSCULAR | Status: AC
  Filled 2014-10-16: qty 1

## 2014-10-16 MED ORDER — ROCURONIUM BROMIDE 50 MG/5ML IV SOLN
INTRAVENOUS | Status: AC
  Filled 2014-10-16: qty 1

## 2014-10-16 MED ORDER — FENTANYL CITRATE (PF) 100 MCG/2ML IJ SOLN
INTRAMUSCULAR | Status: DC | PRN
  Administered 2014-10-16 (×3): 50 ug via INTRAVENOUS

## 2014-10-16 MED ORDER — SUCCINYLCHOLINE CHLORIDE 20 MG/ML IJ SOLN
INTRAMUSCULAR | Status: AC
  Filled 2014-10-16: qty 1

## 2014-10-16 MED ORDER — SODIUM CHLORIDE 0.9 % IV SOLN
INTRAVENOUS | Status: DC
  Administered 2014-10-16: 13:00:00 via INTRAVENOUS

## 2014-10-16 MED ORDER — PANTOPRAZOLE SODIUM 40 MG PO TBEC
40.0000 mg | DELAYED_RELEASE_TABLET | Freq: Every day | ORAL | Status: DC
  Administered 2014-10-17 – 2014-10-18 (×2): 40 mg via ORAL
  Filled 2014-10-16 (×3): qty 1

## 2014-10-16 MED ORDER — POTASSIUM CHLORIDE CRYS ER 20 MEQ PO TBCR: 20.0000 meq | EXTENDED_RELEASE_TABLET | Freq: Every day | ORAL | Status: DC | PRN

## 2014-10-16 MED ORDER — HYDROMORPHONE HCL 1 MG/ML IJ SOLN
INTRAMUSCULAR | Status: AC
  Filled 2014-10-16: qty 1

## 2014-10-16 MED ORDER — HALOPERIDOL LACTATE 5 MG/ML IJ SOLN
1.0000 mg | INTRAMUSCULAR | Status: DC | PRN
  Filled 2014-10-16 (×2): qty 0.2

## 2014-10-16 MED ORDER — OXYCODONE HCL 5 MG/5ML PO SOLN: 5.0000 mg | Freq: Once | ORAL | Status: DC | PRN

## 2014-10-16 MED ORDER — BISACODYL 10 MG RE SUPP: 10.0000 mg | Freq: Every day | RECTAL | Status: DC | PRN

## 2014-10-16 MED ORDER — METOPROLOL TARTRATE 1 MG/ML IV SOLN: 2.0000 mg | INTRAVENOUS | Status: DC | PRN

## 2014-10-16 MED ORDER — ARTIFICIAL TEARS OP OINT
TOPICAL_OINTMENT | OPHTHALMIC | Status: DC | PRN
  Administered 2014-10-16: 1 via OPHTHALMIC

## 2014-10-16 MED ORDER — LIDOCAINE HCL (CARDIAC) 20 MG/ML IV SOLN
INTRAVENOUS | Status: DC | PRN
  Administered 2014-10-16: 60 mg via INTRAVENOUS

## 2014-10-16 MED ORDER — DEXTROSE 5 % IV SOLN
1.5000 g | Freq: Two times a day (BID) | INTRAVENOUS | Status: AC
  Administered 2014-10-16 – 2014-10-17 (×2): 1.5 g via INTRAVENOUS
  Filled 2014-10-16 (×2): qty 1.5

## 2014-10-16 MED ORDER — ENOXAPARIN SODIUM 80 MG/0.8ML ~~LOC~~ SOLN
1.0000 mg/kg | Freq: Two times a day (BID) | SUBCUTANEOUS | Status: DC
  Filled 2014-10-16: qty 0.8

## 2014-10-16 MED ORDER — FENTANYL CITRATE (PF) 250 MCG/5ML IJ SOLN
INTRAMUSCULAR | Status: AC
  Filled 2014-10-16: qty 5

## 2014-10-16 MED ORDER — HYDRALAZINE HCL 20 MG/ML IJ SOLN: 5.0000 mg | INTRAMUSCULAR | Status: DC | PRN

## 2014-10-16 MED ORDER — PHENYLEPHRINE HCL 10 MG/ML IJ SOLN
INTRAMUSCULAR | Status: DC | PRN
  Administered 2014-10-16 (×5): 80 ug via INTRAVENOUS
  Administered 2014-10-16: 120 ug via INTRAVENOUS

## 2014-10-16 MED ORDER — LIDOCAINE HCL (CARDIAC) 20 MG/ML IV SOLN
INTRAVENOUS | Status: AC
  Filled 2014-10-16: qty 5

## 2014-10-16 MED ORDER — MAGNESIUM SULFATE 2 GM/50ML IV SOLN
2.0000 g | Freq: Every day | INTRAVENOUS | Status: DC | PRN
  Filled 2014-10-16: qty 50

## 2014-10-16 MED ORDER — PROMETHAZINE HCL 25 MG/ML IJ SOLN: 6.2500 mg | INTRAMUSCULAR | Status: DC | PRN

## 2014-10-16 MED ORDER — LABETALOL HCL 5 MG/ML IV SOLN
10.0000 mg | INTRAVENOUS | Status: DC | PRN
  Filled 2014-10-16: qty 4

## 2014-10-16 MED ORDER — ONDANSETRON HCL 4 MG/2ML IJ SOLN
INTRAMUSCULAR | Status: DC | PRN
  Administered 2014-10-16: 4 mg via INTRAVENOUS

## 2014-10-16 MED ORDER — 0.9 % SODIUM CHLORIDE (POUR BTL) OPTIME
TOPICAL | Status: DC | PRN
  Administered 2014-10-16: 1000 mL

## 2014-10-16 MED ORDER — PHENOL 1.4 % MT LIQD
1.0000 | OROMUCOSAL | Status: DC | PRN
  Filled 2014-10-16: qty 177

## 2014-10-16 MED ORDER — OXYCODONE HCL 5 MG PO TABS
5.0000 mg | ORAL_TABLET | ORAL | Status: DC | PRN
  Administered 2014-10-17: 5 mg via ORAL
  Administered 2014-10-17: 10 mg via ORAL
  Administered 2014-10-18: 5 mg via ORAL
  Filled 2014-10-16: qty 1
  Filled 2014-10-16: qty 2
  Filled 2014-10-16: qty 1

## 2014-10-16 MED ORDER — PROPOFOL 10 MG/ML IV BOLUS
INTRAVENOUS | Status: AC
  Filled 2014-10-16: qty 20

## 2014-10-16 MED ORDER — DEXAMETHASONE SODIUM PHOSPHATE 4 MG/ML IJ SOLN
INTRAMUSCULAR | Status: DC | PRN
  Administered 2014-10-16: 4 mg via INTRAVENOUS

## 2014-10-16 MED ORDER — SUCCINYLCHOLINE CHLORIDE 20 MG/ML IJ SOLN
INTRAMUSCULAR | Status: DC | PRN
  Administered 2014-10-16: 80 mg via INTRAVENOUS

## 2014-10-16 SURGICAL SUPPLY — 45 items
BANDAGE ELASTIC 4 VELCRO ST LF (GAUZE/BANDAGES/DRESSINGS) ×4 IMPLANT
BANDAGE ELASTIC 6 VELCRO ST LF (GAUZE/BANDAGES/DRESSINGS) ×2 IMPLANT
BANDAGE ESMARK 6X9 LF (GAUZE/BANDAGES/DRESSINGS) IMPLANT
BLADE SAW RECIP 87.9 MT (BLADE) ×2 IMPLANT
BNDG COHESIVE 6X5 TAN STRL LF (GAUZE/BANDAGES/DRESSINGS) ×2 IMPLANT
BNDG ESMARK 6X9 LF (GAUZE/BANDAGES/DRESSINGS)
BNDG GAUZE ELAST 4 BULKY (GAUZE/BANDAGES/DRESSINGS) ×4 IMPLANT
CANISTER SUCTION 2500CC (MISCELLANEOUS) ×2 IMPLANT
CLIP TI MEDIUM 6 (CLIP) ×2 IMPLANT
COVER SURGICAL LIGHT HANDLE (MISCELLANEOUS) ×2 IMPLANT
CUFF TOURNIQUET SINGLE 24IN (TOURNIQUET CUFF) IMPLANT
CUFF TOURNIQUET SINGLE 34IN LL (TOURNIQUET CUFF) IMPLANT
CUFF TOURNIQUET SINGLE 44IN (TOURNIQUET CUFF) IMPLANT
DRAPE ORTHO SPLIT 77X108 STRL (DRAPES) ×2
DRAPE PROXIMA HALF (DRAPES) IMPLANT
DRAPE SURG ORHT 6 SPLT 77X108 (DRAPES) ×2 IMPLANT
DRSG ADAPTIC 3X8 NADH LF (GAUZE/BANDAGES/DRESSINGS) ×2 IMPLANT
DRSG PAD ABDOMINAL 8X10 ST (GAUZE/BANDAGES/DRESSINGS) ×2 IMPLANT
ELECT REM PT RETURN 9FT ADLT (ELECTROSURGICAL) ×2
ELECTRODE REM PT RTRN 9FT ADLT (ELECTROSURGICAL) ×1 IMPLANT
EVACUATOR 1/8 PVC DRAIN (DRAIN) ×2 IMPLANT
GAUZE SPONGE 4X4 12PLY STRL (GAUZE/BANDAGES/DRESSINGS) ×2 IMPLANT
GLOVE SS BIOGEL STRL SZ 7 (GLOVE) ×1 IMPLANT
GLOVE SUPERSENSE BIOGEL SZ 7 (GLOVE) ×1
GOWN STRL REUS W/ TWL LRG LVL3 (GOWN DISPOSABLE) ×3 IMPLANT
GOWN STRL REUS W/TWL LRG LVL3 (GOWN DISPOSABLE) ×3
KIT BASIN OR (CUSTOM PROCEDURE TRAY) ×2 IMPLANT
KIT ROOM TURNOVER OR (KITS) ×2 IMPLANT
NS IRRIG 1000ML POUR BTL (IV SOLUTION) ×2 IMPLANT
PACK GENERAL/GYN (CUSTOM PROCEDURE TRAY) ×2 IMPLANT
PAD ARMBOARD 7.5X6 YLW CONV (MISCELLANEOUS) ×2 IMPLANT
PADDING CAST COTTON 6X4 STRL (CAST SUPPLIES) IMPLANT
SPONGE GAUZE 4X4 12PLY STER LF (GAUZE/BANDAGES/DRESSINGS) ×2 IMPLANT
STAPLER VISISTAT 35W (STAPLE) ×2 IMPLANT
STOCKINETTE IMPERVIOUS LG (DRAPES) ×2 IMPLANT
SUT SILK 2 0 (SUTURE) ×2
SUT SILK 2 0 SH CR/8 (SUTURE) ×4 IMPLANT
SUT SILK 2-0 18XBRD TIE 12 (SUTURE) ×1 IMPLANT
SUT SILK 3 0 (SUTURE) ×1
SUT SILK 3-0 18XBRD TIE 12 (SUTURE) ×1 IMPLANT
SUT VIC AB 2-0 CT1 18 (SUTURE) ×4 IMPLANT
SUT VIC AB 2-0 CT1 27 (SUTURE) ×4
SUT VIC AB 2-0 CT1 TAPERPNT 27 (SUTURE) ×2 IMPLANT
UNDERPAD 30X30 INCONTINENT (UNDERPADS AND DIAPERS) ×2 IMPLANT
WATER STERILE IRR 1000ML POUR (IV SOLUTION) ×2 IMPLANT

## 2014-10-16 NOTE — Progress Notes (Signed)
ANTICOAGULATION CONSULT NOTE - Follow Up Consult  Pharmacy Consult for Coumadin Indication: PE and DVT  Allergies  Allergen Reactions  . Lipitor [Atorvastatin] Other (See Comments)    unknown    Patient Measurements: Weight: 148 lb 9.4 oz (67.4 kg)   Vital Signs: Temp: 97.9 F (36.6 C) (07/06 1148) Temp Source: Oral (07/06 0516) BP: 115/69 mmHg (07/06 1218) Pulse Rate: 87 (07/06 1218)  Labs:  Recent Labs  10/15/14 1729 10/16/14 0350  HGB 9.4* 9.2*  HCT 29.1* 28.4*  PLT 359 334  APTT  --  29  LABPROT 22.0* 21.4*  INR 1.93* 1.86*  CREATININE 1.48* 1.26*    Estimated Creatinine Clearance: 36.4 mL/min (by C-G formula based on Cr of 1.26).   Assessment: 81yo male with history of b/l DVT and PE on coumadin PTA here for AKA on 7/6. Has been on bridge with enoxaparin prior to procedure. Pharmacy is consulted to restart coumadin s/p AKA. INR has trended down to 1.86. Hgb 9.2, plts wnl.  PTA Coumadin 5mg  daily exc 7.5mg  on MF  Goal of Therapy:  INR 2-3 Monitor platelets by anticoagulation protocol: Yes   Plan:  Give coumadin 5mg  PO x 1 Enoxaparin 70mg  SQ x 1 tomorrow am per MD Monitor daily INR, CBC, s/s of bleed F/U bridge back to coumadin plan   Gustie Bobb J 10/16/2014,12:25 PM

## 2014-10-16 NOTE — Progress Notes (Signed)
MD made aware pt pulled out IV, disconnect y-port of hemovac and took off surgical dressing. Verbal orders received. L stump redressed.  Juluis Rainier, RN

## 2014-10-16 NOTE — H&P (View-Only) (Signed)
Subjective:     Patient ID: Scott [name], male   DOB: 20-Jun-1921, 81 y.o.   MRN: [id number]  HPI this 81 year old male is accompanied by his son and evaluated for ischemic ulcer left foot with severe pain. The patient has had pain and drainage between the fourth and fifth toes of the left foot for at least 3 months documented in the medical records. Over the past 2 weeks he has been at the wound center area did he was referred here for further evaluation.. Patient has no history of diabetes mellitus. According to the son he was ambulating as recent as 2 weeks ago. Patient is currently in a wheelchair. Pain control is a significant problem. The patient's signs wife is an orthopedic surgeon and foot specialist in [place] and I spent 20 minutes speaking with her on the phone regarding this problem. The patient has a history of a non-STEMI in 2014, pulmonary embolus, bilateral DVT, nonischemic cardiomyopathy with an EF of 20% and a history of nonsustained ventricular tachycardia. Was not felt to be a candidate for an ICD.  Past Medical History  Diagnosis Date  . HTN (hypertension)   . Hyperlipidemia   . PVCs (premature ventricular contractions)   . Glaucoma   . PVD (peripheral vascular disease)     Right ABI .75, Left .78 (2006)  . Popliteal artery aneurysm, bilateral DECUMENTED CHRONIC PARTIAL OCCLUSION--  PT DENIES CLAUDICATION OR ANY OTHER SYMPTOMS  . Frequency of urination   . Nocturia   . Bladder cancer 03/15/2012  . Chronic systolic CHF (congestive heart failure)     a. NICM - patent cors 06/2012, EF 40% at that time. b. 03/2013 eval: EF 20-25%.  . DVT, bilateral lower limbs 03/2013    a. Bilat DVT dx 03/2013.  . Pulmonary embolism     a. By CT angio 04/2013.  Marland Kitchen Elevated troponin     a. Adm 12/30-04/2013 - not felt to represent ACS; ?due to PE. b. Patent cors 06/2012.  Marland Kitchen NSVT (nonsustained ventricular tachycardia)     a. NSVT 06/2012; NSVT also seen during 03/2013 adm. b. Med rx. Not candidate  for ICD given adv age.  Marland Kitchen ETOH abuse   . Syncope     a. Felt to be postural syncope related to diuretics 06/2012.  Marland Kitchen Heart murmur   . DVT (deep venous thrombosis)     "I think in both legs"  . Pneumonia     "couple times"  . DDD (degenerative disc disease)   . Arthritis     "all over"  . Chronic back pain   . Depression     History  Substance Use Topics  . Smoking status: Former Smoker -- 1.00 packs/day for 20 years    Types: Cigarettes    Start date: 04/12/1942    Quit date: 04/13/1975  . Smokeless tobacco: Never Used  . Alcohol Use: No     Comment: 10/09/2013 "quit drinking in early June 2015"    Family History  Problem Relation Age of Onset  . Hypertension Mother   . Arthritis Mother   . Lung cancer Sister   . Deep vein thrombosis Father   . Early death Brother     Allergies  Allergen Reactions  . Lipitor [Atorvastatin] Other (See Comments)    unknown     Current outpatient prescriptions:  .  donepezil (ARICEPT) 10 MG tablet, TAKE ONE TABLET AT BEDTIME, Disp: 30 tablet, Rfl: 2 .  doxycycline (VIBRA-TABS) 100 MG tablet, Take  1 tablet (100 mg total) by mouth 2 (two) times daily. With food, Disp: 28 tablet, Rfl: 0 .  fluticasone (FLONASE) 50 MCG/ACT nasal spray, Place 2 sprays into both nostrils daily., Disp: 16 g, Rfl: 6 .  furosemide (LASIX) 40 MG tablet, 40 MG ON MON, WED, AND FRI'S; THEN 20 MG ALL OTHER DAYS, Disp: 30 tablet, Rfl: 5 .  gabapentin (NEURONTIN) 100 MG capsule, TAKE (2) CAPSULES TWICE DAILY., Disp: 120 capsule, Rfl: 2 .  isosorbide mononitrate (IMDUR) 30 MG 24 hr tablet, Take 0.5 tablets (15 mg total) by mouth daily., Disp: 30 tablet, Rfl: 5 .  losartan (COZAAR) 25 MG tablet, Take 1 tablet (25 mg total) by mouth daily., Disp: 90 tablet, Rfl: 3 .  meclizine (ANTIVERT) 32 MG tablet, Take 1 tablet (32 mg total) by mouth 3 (three) times daily as needed., Disp: 30 tablet, Rfl: 0 .  megestrol (MEGACE) 20 MG tablet, TAKE 1 TABLET ONCE A DAY, Disp: 30  tablet, Rfl: 3 .  metoprolol succinate (TOPROL-XL) 50 MG 24 hr tablet, TAKE (1) TABLET TWICE A DAY., Disp: 60 tablet, Rfl: 6 .  mupirocin ointment (BACTROBAN) 2 %, Apply small amt with qtip to wound after soaking TID, Disp: 22 g, Rfl: 0 .  nitroGLYCERIN (NITROSTAT) 0.4 MG SL tablet, Place 1 tablet (0.4 mg total) under the tongue every 5 (five) minutes as needed for chest pain (up to 3 doses)., Disp: 25 tablet, Rfl: 3 .  potassium chloride SA (K-DUR,KLOR-CON) 20 MEQ tablet, Take 1 tablet (20 mEq total) by mouth daily., Disp: 90 tablet, Rfl: 1 .  simvastatin (ZOCOR) 10 MG tablet, TAKE 1 TABLET DAILY, Disp: 30 tablet, Rfl: 4 .  spironolactone (ALDACTONE) 25 MG tablet, TAKE 1 TABLET DAILY, Disp: 30 tablet, Rfl: 3 .  tamsulosin (FLOMAX) 0.4 MG CAPS capsule, TAKE (1) CAPSULE DAILY, Disp: 30 capsule, Rfl: 5 .  traMADol (ULTRAM) 50 MG tablet, Take 1 tablet (50 mg total) by mouth every 12 (twelve) hours as needed., Disp: 30 tablet, Rfl: 0 .  ULORIC 40 MG tablet, TAKE 1 TABLET DAILY, Disp: 30 tablet, Rfl: 4 .  warfarin (COUMADIN) 2.5 MG tablet, TAKE UP TO 3 TABLETS DAILY AS DIRECTED BY CPP, Disp: 90 tablet, Rfl: 2  Filed Vitals:   09/24/14 1235  BP: 112/57  Pulse: 67  Resp: 16  Height: 5\' 6"  (1.676 m)  Weight: 164 lb (74.39 kg)    Body mass index is 26.48 kg/(m^2).          Marland Kitchen   Review of Systems patient has history of multiple problems including DVT, chronic congestive heart failure, low ejection fraction, dyspnea on exertion, bilateral DVTs on chronic anticoagulation with Coumadin. All other systems reviewed with multiple complaints.     Objective:   Physical Exam BP 112/57 mmHg  Pulse 67  Resp 16  Ht 5\' 6"  (1.676 m)  Wt 164 lb (74.39 kg)  BMI 26.48 kg/m2  Gen.-alert and oriented x3 in no apparent distress-in a wheelchair and very frail in appearance HEENT normal for age Lungs no rhonchi or wheezing Cardiovascular regular rhythm no murmurs carotid pulses 3+ palpable no bruits  audible Abdomen soft nontender no palpable masses Musculoskeletal free of  major deformities Skin clear -no rashes Neurologic normal Lower extremities 3+ femoral pulses palpable bilaterally Right leg has absent popliteal and distal pulses with no ischemic ulcers noted Left foot has absent popliteal and distal pulses. There is edema from the knee to the foot at 1-2+. There is a dressing  between the fourth and fifth toe where there is an ulceration with gangrenous changes in the skin surrounding this area. There is no proximal cellulitis.  I reviewed the previous vascular lab studies performed about one week ago which revealed an ABI of 0.34 on the right and 0.57 on the left performed on 09/17/2014. I also ordered a duplex scan of the left leg which I reviewed and interpreted. The patient has severe calcific occlusive disease throughout all vessels in the lower extremity with total occlusion of the superficial femoral artery in the proximal to mid area throughout the below-knee popliteal artery with known occlusion of anterior tibial artery from previous CT angiogram in 2012. There was no visualization of the peroneal artery and a very diseased posterior tibial artery with dampened monophasic flow.       Assessment:     Ischemic ulceration between fourth and fifth toe with surrounding edema and rest pain Nonischemic cardiomyopathy with EF 20% History of non-STEMI History of pulmonary embolus History of bilateral DVT Currently on chronic anticoagulation with Coumadin with some mild renal insufficiency  I spent 45 minutes discussing situation with patient son and daughter-in-law who is an orthopedic surgeon by phone and coordination of care. By recommendation is primary leg amputation. I do not think the patient is a candidate for an open bypass procedure. With extensive occlusion of his superficial femoral popliteal and tibial vessels angioplasty and stenting will not be successful and patient  has renal insufficiency so renal function could be in jeopardy. Patient's all along the orthopedic surgeon Dr. Wynetta Emery is agrees with this assessment.    Plan:     Patient needs left above-knee amputation. Patient and son will check back with the wound center to help coordinate pain management and if they would like for [place] to proceed with left above-knee amputation in the near future we will be happy to coordinate that. Patient would need to be admitted by the hospitalist service to help manage these multiple medical problems

## 2014-10-16 NOTE — Anesthesia Preprocedure Evaluation (Addendum)
Anesthesia Evaluation  Patient identified by MRN, date of birth, ID band Patient awake    Reviewed: Allergy & Precautions, NPO status , Patient's Chart, lab work & pertinent test results, reviewed documented beta blocker date and time   Airway Mallampati: II  TM Distance: >3 FB Neck ROM: Limited    Dental   Pulmonary former smoker,  breath sounds clear to auscultation        Cardiovascular hypertension, Pt. on medications and Pt. on home beta blockers + Peripheral Vascular Disease and +CHF Rhythm:Regular Rate:Normal     Neuro/Psych negative neurological ROS     GI/Hepatic negative GI ROS, (+)     substance abuse  alcohol use,   Endo/Other  negative endocrine ROS  Renal/GU Renal InsufficiencyRenal disease     Musculoskeletal  (+) Arthritis -,   Abdominal   Peds  Hematology  (+) anemia ,   Anesthesia Other Findings   Reproductive/Obstetrics                            Anesthesia Physical Anesthesia Plan  ASA: III  Anesthesia Plan: General   Post-op Pain Management:    Induction: Intravenous  Airway Management Planned: Oral ETT  Additional Equipment:   Intra-op Plan:   Post-operative Plan: Extubation in OR  Informed Consent: I have reviewed the patients History and Physical, chart, labs and discussed the procedure including the risks, benefits and alternatives for the proposed anesthesia with the patient or authorized representative who has indicated his/her understanding and acceptance.   Dental advisory given  Plan Discussed with: CRNA  Anesthesia Plan Comments:         Anesthesia Quick Evaluation

## 2014-10-16 NOTE — Transfer of Care (Signed)
Immediate Anesthesia Transfer of Care Note  Patient: Scott [name]  Procedure(s) Performed: Procedure(s): AMPUTATION ABOVE KNEE- LEFT (Left)  Patient Location: PACU  Anesthesia Type:General  Level of Consciousness: awake, alert , oriented and patient cooperative  Airway & Oxygen Therapy: Patient Spontanous Breathing and Patient connected to face mask oxygen  Post-op Assessment: Report given to RN and Post -op Vital signs reviewed and stable  Post vital signs: Reviewed and stable  Last Vitals:  Filed Vitals:   10/16/14 0516  BP: 131/76  Pulse: 94  Temp: 36.9 C  Resp: 17    Complications: No apparent anesthesia complications

## 2014-10-16 NOTE — Interval H&P Note (Signed)
History and Physical Interval Note:  10/16/2014 8:44 AM  Scott [name]  has presented today for surgery, with the diagnosis of Peripheral Vascular Disease with gangrene left foot I70.262  The various methods of treatment have been discussed with the patient and family. After consideration of risks, benefits and other options for treatment, the patient has consented to  Procedure(s): AMPUTATION ABOVE KNEE- LEFT (Left) as a surgical intervention .  The patient's history has been reviewed, patient examined, no change in status, stable for surgery.  I have reviewed the patient's chart and labs.  Questions were answered to the patient's satisfaction.     Tinnie Gens

## 2014-10-16 NOTE — Anesthesia Procedure Notes (Signed)
Procedure Name: Intubation Date/Time: 10/16/2014 8:52 AM Performed by: Rogers Blocker Pre-anesthesia Checklist: Patient identified, Timeout performed, Emergency Drugs available, Suction available and Patient being monitored Patient Re-evaluated:Patient Re-evaluated prior to inductionOxygen Delivery Method: Circle system utilized Preoxygenation: Pre-oxygenation with 100% oxygen Intubation Type: IV induction Ventilation: Mask ventilation without difficulty Laryngoscope Size: Mac and 4 Grade View: Grade I Tube type: Oral Tube size: 7.5 mm Number of attempts: 1 Airway Equipment and Method: Stylet Placement Confirmation: ETT inserted through vocal cords under direct vision,  breath sounds checked- equal and bilateral,  positive ETCO2 and CO2 detector Secured at: 22 cm Tube secured with: Tape Dental Injury: Teeth and Oropharynx as per pre-operative assessment

## 2014-10-16 NOTE — Progress Notes (Signed)
Pt. Refused pills and refused to drink anything after surgery today. Juluis Rainier, RN

## 2014-10-16 NOTE — Progress Notes (Signed)
MD made aware pt. Is extremely agitated, trying to get out of bed and pulling at lines. Pt. Took off mittens 20 minutes after applied.  Juluis Rainier, RN

## 2014-10-16 NOTE — Progress Notes (Signed)
Patient Demographics  Scott [name], is a 81 y.o. male, DOB - 1921/07/05, FFM:[id number]  Admit date - 10/15/2014   Admitting Physician Theodis Blaze, MD  Outpatient Primary MD for the patient is Redge Gainer, MD  LOS - 1   No chief complaint on file.      Admission HPI/Brief narrative: 81 year old male  with known severe peripheral vascular disease, nonischemic cardiomyopathy with EF 20-25% in December 2015 but as of June 2016 EF now up to 50-55% with grade 1 diastolic dysfunction, history of DVT with PE in 2014 on chronic warfarin, recently treated for gangrenous ulcers on the left toes, discharge 10/08/14. Dr. Kellie Simmering recommended an above the knee amputation noting patient was not a candidate for an open bypass procedure. Patient admitted 10/15/14 the medical team for surgical procedure, patient went left above-knee amputation by Dr. Kellie Simmering 08/16/14. Subjective:   [name] today has, No headache, No chest pain, No abdominal pain - No Nausea, no cough or shortness of breath .  Assessment & Plan    Principal Problem:   Ischemic ulcer of left foot Active Problems:   HTN (hypertension)   Hyperlipidemia   CAD- non obstructive disease by cath 3/14   Chronic diastolic heart failure, NYHA class 1   DVT of lower extremity, bilateral   History of pulmonary embolism (2014)   BPH (benign prostatic hyperplasia)   Atherosclerotic PVD with ulceration  Ischemic ulcer of left foot secondary to PVD - Not a candidate for open bypass procedure, underwent left above-knee amputation 10/16/14 by Dr. Kellie Simmering. - provide analgesia as needed  - Management as per vascular surgery.  CAD- non obstructive disease by cath 3/14 - Denies any chest pain or shortness of breath, continue with beta blockers, and statin   Chronic combined systolic and diastolic heart failure, NYHA class 1 - compensated ,  last 2 D ECHO, EF has  normalized to 50% - monitor daily weights, strict I/O - weight on admission 70.6 kg - Continue with metoprolol, Imdur and hydralazine, no ACE inhibitor or ARB given his renal failure.   History of pulmonary embolism in 2014/DVT of lower extremity, bilateral - Resume her warfarin   CKD stage IV - last admission, Cr as high as 2 - Creatinine is 1.26 today  Dementia -Continue Aricept   BPH /history prostate cancer -Continue Flomax  Code Status: DNR  Family Communication: sister and niece at bedside  Disposition Plan: pending further work up.   Procedures  Left AKA 7/6    Consults  Vascular code   Medications  Scheduled Meds: . cefUROXime (ZINACEF)  IV  1.5 g Intravenous Q12H  . docusate sodium  100 mg Oral BID  . donepezil  10 mg Oral QHS  . [START ON 10/17/2014] enoxaparin (LOVENOX) injection  1 mg/kg Subcutaneous Q12H  . febuxostat  40 mg Oral Daily  . folic acid  1 mg Intravenous Daily  . gabapentin  200 mg Oral BID  . HYDROmorphone      . isosorbide mononitrate  15 mg Oral Daily  . metoprolol succinate  50 mg Oral Daily  . pantoprazole  40 mg Oral Daily  . simvastatin  10 mg Oral Daily  . sodium chloride  3 mL Intravenous Q12H  .  tamsulosin  0.4 mg Oral QPC supper  . thiamine  100 mg Intravenous Daily  . warfarin  5 mg Oral ONCE-1800  . Warfarin - Pharmacist Dosing Inpatient   Does not apply q1800   Continuous Infusions: . sodium chloride 100 mL/hr at 10/16/14 1311  . dextrose 5 % and 0.9% NaCl 50 mL/hr (10/15/14 2225)   PRN Meds:.acetaminophen **OR** acetaminophen, alum & mag hydroxide-simeth, bisacodyl, guaiFENesin-dextromethorphan, hydrALAZINE, labetalol, magnesium sulfate 1 - 4 g bolus IVPB, metoprolol, morphine injection, ondansetron **OR** ondansetron (ZOFRAN) IV, oxyCODONE, phenol, potassium chloride, senna-docusate  DVT Prophylaxis  On warfarin  Lab Results  Component Value Date   PLT 334 10/16/2014    Antibiotics   Anti-infectives     Start     Dose/Rate Route Frequency Ordered Stop   10/16/14 2100  cefUROXime (ZINACEF) 1.5 g in dextrose 5 % 50 mL IVPB     1.5 g 100 mL/hr over 30 Minutes Intravenous Every 12 hours 10/16/14 1157 10/17/14 2059   10/16/14 0800  cefUROXime (ZINACEF) 1.5 g in dextrose 5 % 50 mL IVPB     1.5 g 100 mL/hr over 30 Minutes Intravenous To Surgery 10/15/14 1700 10/16/14 0853   10/15/14 2200  amoxicillin (AMOXIL) capsule 500 mg  Status:  Discontinued     500 mg Oral Every 12 hours 10/15/14 1549 10/16/14 1157          Objective:   Filed Vitals:   10/16/14 1203 10/16/14 1218 10/16/14 1232 10/16/14 1300  BP: 126/45 115/69 134/44 125/44  Pulse: 92 87 86 86  Temp:      TempSrc:      Resp:      Weight:      SpO2: 96% 95%      Wt Readings from Last 3 Encounters:  10/16/14 67.4 kg (148 lb 9.4 oz)  10/07/14 70.6 kg (155 lb 10.3 oz)  09/24/14 74.39 kg (164 lb)     Intake/Output Summary (Last 24 hours) at 10/16/14 1400 Last data filed at 10/16/14 1122  Gross per 24 hour  Intake    400 ml  Output   1300 ml  Net   -900 ml     Physical Exam  Patient is awake, communicative, just came back from all are . [place].AT,PERRAL Supple Neck,No JVD,  Symmetrical Chest wall movement, Good air movement bilaterally,  RRR,No Gallops,Rubs  +ve B.Sounds, Abd Soft, No tenderness, No organomegaly appriciated, No rebound - guarding or rigidity. No Cyanosis, Clubbingand right lower extremity, +1 edema, left lower extremity with above-knee amputation with Ace wrap, and drain out from site.  Data Review   Micro Results Recent Results (from the past 240 hour(s))  Surgical pcr screen     Status: Abnormal   Collection Time: 10/15/14  6:07 PM  Result Value Ref Range Status   MRSA, PCR POSITIVE (A) NEGATIVE Final   Staphylococcus aureus POSITIVE (A) NEGATIVE Final    Comment:        The Xpert SA Assay (FDA approved for NASAL specimens in patients over 60 years of age), is one component of a  comprehensive surveillance program.  Test performance has been validated by [facility] for patients greater than or equal to 9 year old. It is not intended to diagnose infection nor to guide or monitor treatment.     Radiology Reports Dg Chest 1 View  10/03/2014   CLINICAL DATA:  Chest congestion and shortness of breath. The patient reports declining health for 3 months.  EXAM: CHEST  1  VIEW  COMPARISON:  PA and lateral chest 03/26/2014.  FINDINGS: The lungs are clear. Heart size is upper normal. No pneumothorax or pleural effusion. No focal bony abnormality.  IMPRESSION: No acute disease.   Electronically Signed   By: Inge Rise M.D.   On: 10/03/2014 17:36   Ct Head Wo Contrast  10/03/2014   CLINICAL DATA:  Altered mental status decline for 3 months.  EXAM: CT HEAD WITHOUT CONTRAST  TECHNIQUE: Contiguous axial images were obtained from the base of the skull through the vertex without intravenous contrast.  COMPARISON:  CT 07/10/2013  FINDINGS: No acute intracranial hemorrhage. No focal mass lesion. No CT evidence of acute infarction. No midline shift or mass effect. No hydrocephalus. Basilar cisterns are patent.  Remote infarction in the left centrum semiovale. There is generalized cortical atrophy. Partial ventricular dilatation. Mild periventricular white matter hypodensities.  13 mm round lesion in the sella which have has low-density is not changed from prior.  IMPRESSION: 1. No acute intracranial findings. 2. Chronic atrophy and white matter microvascular disease. 3. 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 4. Stable low-density cystic lesion in the sella   Electronically Signed   By: Suzy Bouchard M.D.   On: 10/03/2014 14:16   Dg Chest Port 1 View  10/15/2014   CLINICAL DATA:  Pre-op for left-sided above the knee amputation. History of hypertension and cardiac catheterization. Initial encounter.  EXAM: PORTABLE CHEST - 1 VIEW  COMPARISON:  10/05/2014 and 10/03/2014  radiographs.  CT 04/10/2013.  FINDINGS: 1638 hours. The heart size and mediastinal contours are stable. There is aortic atherosclerosis with stable ectasia of the right brachiocephalic artery. There is interval improved aeration of the lungs which are now clear. There is some residual blunting of the right costophrenic angle which may reflect a small right-sided pleural effusion. The bones appear unremarkable.  IMPRESSION: Interval improved aeration of the lungs with possible small residual right pleural effusion. No acute findings.   Electronically Signed   By: Richardean Sale M.D.   On: 10/15/2014 16:47   Dg Chest Port 1 View  10/05/2014   CLINICAL DATA:  Shortness of breath, cough, congestion, wheezing.  EXAM: PORTABLE CHEST - 1 VIEW  COMPARISON:  10/03/2014; 03/26/2014; 03/14/2013 ; chest CT- 04/10/2013  FINDINGS: Grossly unchanged enlarged cardiac silhouette and mediastinal contours given decreased lung volumes and patient rotation. Atherosclerotic plaque within thoracic aorta. There is persistent thickening of the right paratracheal stripe secondary to prominent vasculature as demonstrated on prior chest CT performed 03/2013. Pulmonary venous congestion without frank evidence of edema. Worsening bibasilar and right perihilar opacities, right greater than left. No definite pleural effusion or pneumothorax. Unchanged bones.  IMPRESSION: Worsening right perihilar and bibasilar opacities, right greater than left, possibly atelectasis fell aspiration and/or infection could have a similar appearance. Further evaluation with a PA and lateral chest radiograph may be obtained as clinically indicated.   Electronically Signed   By: Sandi Mariscal M.D.   On: 10/05/2014 08:59   Dg Toe 5th Left  10/03/2014   CLINICAL DATA:  One month history of persistent skin ulceration. Pain and swelling.  EXAM: DG TOE 5TH LEFT  COMPARISON:  09/12/2014  FINDINGS: Knee PIP joint appears slightly narrowed compared to the prior study.  There is air surrounding the joint and there is slight cortical irregularity along the medial aspect of the distal aspect of the proximal phalanx. Could not exclude septic arthritis and osteomyelitis. MRI is recommended for further evaluation.  IMPRESSION: Diffuse  soft tissue swelling involving the fifth toe with air in the soft tissues.  Could not exclude septic arthritis and osteomyelitis centered at the PIP joint of the fifth toe. MRI without and with contrast is recommended for further evaluation.   Electronically Signed   By: Marijo Sanes M.D.   On: 10/03/2014 12:53     CBC  Recent Labs Lab 10/15/14 1729 10/16/14 0350  WBC 7.1 6.4  HGB 9.4* 9.2*  HCT 29.1* 28.4*  PLT 359 334  MCV 82.9 82.6  MCH 26.8 26.7  MCHC 32.3 32.4  RDW 14.8 14.7    Chemistries   Recent Labs Lab 10/15/14 1729 10/16/14 0350  NA 139 141  K 4.2 5.1  CL 106 109  CO2 26 24  GLUCOSE 109* 92  BUN 31* 25*  CREATININE 1.48* 1.26*  CALCIUM 9.0 9.4  MG 1.9  --   AST 45*  --   ALT 43  --   ALKPHOS 98  --   BILITOT 0.3  --    ------------------------------------------------------------------------------------------------------------------ estimated creatinine clearance is 36.4 mL/min (by C-G formula based on Cr of 1.26). ------------------------------------------------------------------------------------------------------------------ No results for input(s): HGBA1C in the last 72 hours. ------------------------------------------------------------------------------------------------------------------ No results for input(s): CHOL, HDL, LDLCALC, TRIG, CHOLHDL, LDLDIRECT in the last 72 hours. ------------------------------------------------------------------------------------------------------------------ No results for input(s): TSH, T4TOTAL, T3FREE, THYROIDAB in the last 72 hours.  Invalid input(s):  FREET3 ------------------------------------------------------------------------------------------------------------------ No results for input(s): VITAMINB12, FOLATE, FERRITIN, TIBC, IRON, RETICCTPCT in the last 72 hours.  Coagulation profile  Recent Labs Lab 10/15/14 1729 10/16/14 0350  INR 1.93* 1.86*    No results for input(s): DDIMER in the last 72 hours.  Cardiac Enzymes No results for input(s): CKMB, TROPONINI, MYOGLOBIN in the last 168 hours.  Invalid input(s): CK ------------------------------------------------------------------------------------------------------------------ Invalid input(s): POCBNP     Time Spent in minutes   30 minutes   Johann Gascoigne M.D on 10/16/2014 at 2:00 PM  Between 7am to 7pm - Pager - [phone]  After 7pm go to [email or web address] - password [facility]  [facility]   Office  [phone]

## 2014-10-16 NOTE — Op Note (Signed)
OPERATIVE REPORT  Date of Surgery: 10/15/2014 - 10/16/2014  Surgeon: Tinnie Gens, MD  Assistant: Gerri Lins PA  Pre-op Diagnosis: Peripheral Vascular Disease with gangrene left foot I70.262  Post-op Diagnosis: Peripheral Vascular Disease with gangrene left foot I70.262  Procedure: Procedure(s): AMPUTATION ABOVE KNEE- LEFT  Anesthesia: General  EBL: 50 cc  Complications: None  The patient was taken to the operating room placed in the supine position at which time satisfactory general endotracheal anesthesia was administered. The left leg was prepped with Betadine scrub and solution draped in routine sterile manner. The left foot and distal calf were isolated with an impervious stockinette. Skin flaps equal anterior and posterior were marked for an above-knee amputation on the left side basing the femur about 4-5 inches above the knee joint. Incision carried down through skin subcutaneous tissue with scalpel. Muscle was divided with the Bovie. Superficial femoral artery and vein were ligated with 2-0 silk ties and ligatures and the nerve was individually ligated with 2-0 silk tie allowed to retract proximally. Femur was cleaned proximally with periosteal elevator divided with the Stryker saw and smoothed with the rasp. Posterior muscles were divided with the Bovie and specimen removed from the table. Adequate hemostasis was achieved and following thorough irrigation of the stump medium Hemovac was brought out medially and laterally secured with silk sutures. Fascia was closed over the bone with interrupted 20 Vicryls and skin in layers with subcuticular Vicryls and skin clips sterile dressing applied patient taken to recovery room in satisfactory condition  Procedure Details:   Tinnie Gens, MD 10/16/2014 10:05 AM

## 2014-10-16 NOTE — Progress Notes (Signed)
Patient took out PCA tubes and refused to be connected back up, also refused all 22:00 medications. MD notified. Per MD order, PCA discontinued and no new order received at this time. Will continue to monitor.

## 2014-10-16 NOTE — Progress Notes (Signed)
Pt. Removed mittens and refuses for them to be replaced. Family at bedside. MD made aware.

## 2014-10-16 NOTE — Anesthesia Postprocedure Evaluation (Signed)
  Anesthesia Post-op Note  Patient: Scott [name]  Procedure(s) Performed: Procedure(s): AMPUTATION ABOVE KNEE- LEFT (Left)  Patient Location: PACU  Anesthesia Type:General  Level of Consciousness: awake and alert   Airway and Oxygen Therapy: Patient Spontanous Breathing  Post-op Pain: mild  Post-op Assessment: Post-op Vital signs reviewed              Post-op Vital Signs: Reviewed  Last Vitals:  Filed Vitals:   10/16/14 1400  BP: 112/93  Pulse: 88  Temp:   Resp:     Complications: No apparent anesthesia complications

## 2014-10-16 NOTE — Progress Notes (Signed)
RN, Kelton Pillar, paged this NP about hemovac tubing coming lose from amputation site. Asked RN to call surgeon.  KJKG, NP

## 2014-10-16 NOTE — Consult Note (Signed)
Physical Medicine and Rehabilitation Consult Reason for Consult: L-BKA Referring Physician: Dr. Kellie Simmering   HPI: Scott [name] is a 81 y.o. male with history of HTN, BLE DVT/ PE, A fib- chronic coumadin, NICM, severe PVD with gangrenous changes left foot with recommendations for amputation due to non-salvageable  limb. Patient elected to undergo L-AKA on 10/16/14 by Dr. Kellie Simmering.  Post op with agitation due to delirium as well as ABLA.  PT/OT evaluations to be done today. CIR recommended due to anticipated rehab needs.     Review of Systems  Unable to perform ROS: mental acuity      Past Medical History  Diagnosis Date  . HTN (hypertension)   . Hyperlipidemia   . PVCs (premature ventricular contractions)   . Glaucoma   . PVD (peripheral vascular disease)     Right ABI .75, Left .78 (2006)  . Popliteal artery aneurysm, bilateral DECUMENTED CHRONIC PARTIAL OCCLUSION--  PT DENIES CLAUDICATION OR ANY OTHER SYMPTOMS  . Frequency of urination   . Nocturia   . DVT, bilateral lower limbs 03/2013    a. Bilat DVT dx 03/2013.  . Pulmonary embolism     a. By CT angio 04/2013.  Marland Kitchen Elevated troponin     a. Adm 12/30-04/2013 - not felt to represent ACS; ?due to PE. b. Patent cors 06/2012.  Marland Kitchen NSVT (nonsustained ventricular tachycardia)     a. NSVT 06/2012; NSVT also seen during 03/2013 adm. b. Med rx. Not candidate for ICD given adv age.  Marland Kitchen ETOH abuse   . Syncope     a. Felt to be postural syncope related to diuretics 06/2012.  Marland Kitchen Heart murmur   . DVT (deep venous thrombosis)     "I think in both legs"  . Pneumonia     "couple times"  . DDD (degenerative disc disease)   . Arthritis     "all over"  . Depression   . CAD- non obstructive disease by cath 3/14   . PAD (peripheral artery disease)   . Gangrene of toe 10/03/2014  . Sepsis due to other etiology 10/03/2014  . Chronic systolic heart failure 1/47/82    EF improved 50-55%  . Chronic systolic CHF (congestive heart failure)     a.  NICM - patent cors 06/2012, EF 40% at that time. b. 03/2013 eval: EF 20-25%.  . Chronic lower back pain   . Bladder cancer 03/15/2012    pt denies this hx on 10/15/2014    Past Surgical History  Procedure Laterality Date  . Transthoracic echocardiogram  09-22-2010    MODERATE LVH/ EF 95%/ GRADE I DIASTOLIC DYSFUNCTION/ AORTIC SCLEROSIS WITHOUT STENOSIS/  RV  SYSTOLIC  MILDLY REDUCED FUNCTION  . Cardiovascular stress test  10-15-2010    LOW RISK NUCLEAR STUDY/ NO EVIDENCE OF ISCHEMIA/ NORMAL EF  . Shoulder surgery  1970's    "don't remember which side or what kind of OR"  . Transurethral resection of bladder tumor  10/07/2011    Procedure: TRANSURETHRAL RESECTION OF BLADDER TUMOR (TURBT);  Surgeon: Malka So, MD;  Location: [facility];  Service: Urology;  Laterality: N/A;  . Cystoscopy  10/07/2011    Procedure: CYSTOSCOPY;  Surgeon: Malka So, MD;  Location: [facility];  Service: Urology;  Laterality: N/A;  . Cystoscopy with biopsy  03/14/2012    Procedure: CYSTOSCOPY WITH BIOPSY;  Surgeon: Malka So, MD;  Location: [facility];  Service: Urology;  Laterality: N/A;  WITH FULGURATION  . Transurethral resection of prostate  03/14/2012    Procedure: TRANSURETHRAL RESECTION OF THE PROSTATE WITH GYRUS INSTRUMENTS;  Surgeon: Malka So, MD;  Location: [facility];  Service: Urology;  Laterality: N/A;  . Tonsillectomy    . Cataract extraction w/ intraocular lens  implant, bilateral Bilateral   . Cardiac catheterization  05-11-2004  DR Gilbert PLAQUE/ NORMAL LVF/ EF 55%/  NON-OBSTRUCTIVE LAD 25%  . Cardiac catheterization  06/2012  . Eye surgery      Family History  Problem Relation Age of Onset  . Hypertension Mother   . Arthritis Mother   . Lung cancer Sister   . Deep vein thrombosis Father   . Early death Brother     Social History:  reports that he quit smoking about 39 years ago. His smoking use included Cigarettes. He started smoking  about 72 years ago. He has a 20 pack-year smoking history. He has never used smokeless tobacco. He reports that he does not drink alcohol or use illicit drugs.     Allergies  Allergen Reactions  . Lipitor [Atorvastatin] Other (See Comments)    unknown   Medications Prior to Admission  Medication Sig Dispense Refill  . amoxicillin (AMOXIL) 500 MG capsule Take 1 capsule (500 mg total) by mouth every 12 (twelve) hours. Antibiotic to be taken for 7 more days 14 capsule 0  . donepezil (ARICEPT) 10 MG tablet TAKE ONE TABLET AT BEDTIME 30 tablet 2  . enoxaparin (LOVENOX) 80 MG/0.8ML injection Inject 0.8 mLs (80 mg total) into the skin every 12 (twelve) hours. 7 Syringe 0  . fluticasone (FLONASE) 50 MCG/ACT nasal spray Place 2 sprays into both nostrils daily. 16 g 6  . furosemide (LASIX) 40 MG tablet 40 MG ON MON, WED, AND FRI'S; THEN 20 MG ALL OTHER DAYS 30 tablet 5  . gabapentin (NEURONTIN) 100 MG capsule TAKE (2) CAPSULES TWICE DAILY. 120 capsule 0  . HORSE CHESTNUT PO Take 1 capsule by mouth daily.    Marland Kitchen HYDROcodone-acetaminophen (NORCO/VICODIN) 5-325 MG per tablet Take 1/2 to a whole tab QID PRN for severe pain 60 tablet 0  . isosorbide mononitrate (IMDUR) 30 MG 24 hr tablet Take 0.5 tablets (15 mg total) by mouth daily. 30 tablet 5  . lidocaine (XYLOCAINE) 2 % jelly APPLY AS NEEDED TO THE LEFT FOOD ULCER  2  . megestrol (MEGACE) 20 MG tablet TAKE 1 TABLET ONCE A DAY 30 tablet 3  . metoprolol succinate (TOPROL-XL) 50 MG 24 hr tablet TAKE (1) TABLET TWICE A DAY. 60 tablet 0  . nitroGLYCERIN (NITROSTAT) 0.4 MG SL tablet Place 1 tablet (0.4 mg total) under the tongue every 5 (five) minutes as needed for chest pain (up to 3 doses). 25 tablet 3  . potassium chloride SA (K-DUR,KLOR-CON) 20 MEQ tablet Take 1 tablet (20 mEq total) by mouth daily. 90 tablet 1  . simvastatin (ZOCOR) 10 MG tablet TAKE 1 TABLET DAILY 30 tablet 4  . tamsulosin (FLOMAX) 0.4 MG CAPS capsule TAKE (1) CAPSULE DAILY 30 capsule 5   . ULORIC 40 MG tablet TAKE 1 TABLET DAILY 30 tablet 4  . warfarin (COUMADIN) 2.5 MG tablet Take two tablets each evening until further notified by the physician who is monitoring your blood work.      Home: Home Living Family/patient expects to be discharged to:: Skilled nursing facility Living Arrangements: Spouse/significant other, Other (Comment) ("24h caretakers")  Functional History:   Functional Status:  Mobility:          ADL:    Cognition: Cognition Orientation Level: Oriented to person, Oriented to place, Oriented to time, Disoriented to situation    Blood pressure 126/45, pulse 93, temperature 97.3 F (36.3 C), temperature source Oral, resp. rate 17, weight 61.1 kg (134 lb 11.2 oz), SpO2 92 %. Physical Exam  Nursing note and vitals reviewed. Constitutional: He appears well-developed. He is sleeping. He is easily aroused.  Frail ill appearing elderly male with bilateral mittens and sitter in the room.  HENT:  Head: Normocephalic and atraumatic.  Eyes: Pupils are equal, round, and reactive to light.  Neck: Normal range of motion. Neck supple.  Cardiovascular: Normal rate.  An irregular rhythm present.  Respiratory: Effort normal and breath sounds normal. No respiratory distress. He has no wheezes.  GI: Soft. Bowel sounds are normal. He exhibits no distension. There is no tenderness.  Musculoskeletal: He exhibits no edema.  L-AKA with dressing and drain in place.   Neurological: He is easily aroused.  Sleeping soundly and aroused briefly--didn't fall asleep till early am per sitter. Speech severely dysarthric. Confused and oriented to self only.  Withdraws BUE to pain. RLE limited ROM due to knee stiffness/arthritis.  Skin: Skin is warm and dry.  Incision clean/intact with minimal drainage at present  Psychiatric: His speech is slurred. He is slowed. He exhibits abnormal recent memory.    Results for orders placed or performed during the hospital encounter of  10/15/14 (from the past 24 hour(s))  Glucose, capillary     Status: None   Collection Time: 10/16/14 11:42 AM  Result Value Ref Range   Glucose-Capillary 97 65 - 99 mg/dL  Glucose, capillary     Status: Abnormal   Collection Time: 10/16/14 10:02 PM  Result Value Ref Range   Glucose-Capillary 109 (H) 65 - 99 mg/dL   Comment 1 Notify RN    Comment 2 Document in Chart   CBC     Status: Abnormal   Collection Time: 10/17/14  3:28 AM  Result Value Ref Range   WBC 11.9 (H) 4.0 - 10.5 K/uL   RBC 3.01 (L) 4.22 - 5.81 MIL/uL   Hemoglobin 8.1 (L) 13.0 - 17.0 g/dL   HCT 25.1 (L) 39.0 - 52.0 %   MCV 83.4 78.0 - 100.0 fL   MCH 26.9 26.0 - 34.0 pg   MCHC 32.3 30.0 - 36.0 g/dL   RDW 15.0 11.5 - 15.5 %   Platelets 339 150 - 400 K/uL  Protime-INR     Status: Abnormal   Collection Time: 10/17/14  3:28 AM  Result Value Ref Range   Prothrombin Time 22.1 (H) 11.6 - 15.2 seconds   INR 1.94 (H) 0.00 - 1.49  Glucose, capillary     Status: None   Collection Time: 10/17/14  6:37 AM  Result Value Ref Range   Glucose-Capillary 80 65 - 99 mg/dL   Dg Chest Port 1 View  10/15/2014   CLINICAL DATA:  Pre-op for left-sided above the knee amputation. History of hypertension and cardiac catheterization. Initial encounter.  EXAM: PORTABLE CHEST - 1 VIEW  COMPARISON:  10/05/2014 and 10/03/2014 radiographs.  CT 04/10/2013.  FINDINGS: 1638 hours. The heart size and mediastinal contours are stable. There is aortic atherosclerosis with stable ectasia of the right brachiocephalic artery. There is interval improved aeration of the lungs which are now clear. There is some residual blunting of the right costophrenic angle which may reflect a small right-sided pleural  effusion. The bones appear unremarkable.  IMPRESSION: Interval improved aeration of the lungs with possible small residual right pleural effusion. No acute findings.   Electronically Signed   By: Richardean Sale M.D.   On: 10/15/2014 16:47     Assessment/Plan: Diagnosis: left AKA pod #1 1. Does the need for close, 24 hr/day medical supervision in concert with the patient's rehab needs make it unreasonable for this patient to be served in a less intensive setting? Potentially 2. Co-Morbidities requiring supervision/potential complications: htn, cad, hx dvt, chf 3. Due to bladder management, bowel management, safety, skin/wound care, disease management, medication administration, pain management and patient education, does the patient require 24 hr/day rehab nursing? Potentially 4. Does the patient require coordinated care of a physician, rehab nurse, PT (1-2 hrs/day, 5 days/week) and OT (1-2 hrs/day, 5 days/week) to address physical and functional deficits in the context of the above medical diagnosis(es)? Yes Addressing deficits in the following areas: balance, endurance, locomotion, strength, transferring, bowel/bladder control, bathing, dressing, feeding, grooming and toileting 5. Can the patient actively participate in an intensive therapy program of at least 3 hrs of therapy per day at least 5 days per week? Potentially 6. The potential for patient to make measurable gains while on inpatient rehab is good and fair 7. Anticipated functional outcomes upon discharge from inpatient rehab are supervision and min assist  with PT, supervision and min assist with OT, n/a with SLP. 8. Estimated rehab length of stay to reach the above functional goals is: potentially 12-18 days 9. Does the patient have adequate social supports and living environment to accommodate these discharge functional goals? Potentially 10. Anticipated D/C setting: Home 11. Anticipated post D/C treatments: [place] therapy 12. Overall Rehab/Functional Prognosis: good  RECOMMENDATIONS: This patient's condition is appropriate for continued rehabilitative care in the following setting: see below Patient has agreed to participate in recommended program. Potentially Note  that insurance prior authorization may be required for reimbursement for recommended care.  Comment: Pt states his "paraplegic" wife is at home with him in addition to his son who is "in and out"--?works.  Son would need to be available for assist for [place] to consider CIR. Rehab Admissions Coordinator to follow up.  Thanks,  Meredith Staggers, MD, Mellody Drown     10/17/2014

## 2014-10-17 ENCOUNTER — Encounter (HOSPITAL_COMMUNITY): Payer: Self-pay | Admitting: Vascular Surgery

## 2014-10-17 DIAGNOSIS — Z89612 Acquired absence of left leg above knee: Secondary | ICD-10-CM

## 2014-10-17 LAB — BASIC METABOLIC PANEL
Anion gap: 8 (ref 5–15)
BUN: 17 mg/dL (ref 6–20)
CO2: 25 mmol/L (ref 22–32)
Calcium: 9.1 mg/dL (ref 8.9–10.3)
Chloride: 110 mmol/L (ref 101–111)
Creatinine, Ser: 1.02 mg/dL (ref 0.61–1.24)
GFR calc Af Amer: >60 mL/min (ref >60)
GFR calc non Af Amer: >60 mL/min (ref >60)
Glucose, Bld: 103 mg/dL — ABNORMAL HIGH (ref 65–99)
Potassium: 4.3 mmol/L (ref 3.5–5.1)
Sodium: 143 mmol/L (ref 135–145)

## 2014-10-17 LAB — GLUCOSE, CAPILLARY
Glucose-Capillary: 80 mg/dL (ref 65–99)
Glucose-Capillary: 125 mg/dL — ABNORMAL HIGH (ref 65–99)
Glucose-Capillary: 107 mg/dL — ABNORMAL HIGH (ref 65–99)
Glucose-Capillary: 102 mg/dL — ABNORMAL HIGH (ref 65–99)

## 2014-10-17 LAB — CBC
HCT: 25.1 % — ABNORMAL LOW (ref 39.0–52.0)
Hemoglobin: 8.1 g/dL — ABNORMAL LOW (ref 13.0–17.0)
MCH: 26.9 pg (ref 26.0–34.0)
MCHC: 32.3 g/dL (ref 30.0–36.0)
MCV: 83.4 fL (ref 78.0–100.0)
Platelets: 339 10*3/uL (ref 150–400)
RBC: 3.01 MIL/uL — ABNORMAL LOW (ref 4.22–5.81)
RDW: 15 % (ref 11.5–15.5)
WBC: 11.9 10*3/uL — ABNORMAL HIGH (ref 4.0–10.5)

## 2014-10-17 LAB — PROTIME-INR
INR: 1.94 — ABNORMAL HIGH (ref 0.00–1.49)
Prothrombin Time: 22.1 seconds — ABNORMAL HIGH (ref 11.6–15.2)

## 2014-10-17 MED ORDER — WARFARIN SODIUM 5 MG PO TABS
5.0000 mg | ORAL_TABLET | Freq: Once | ORAL | Status: AC
  Administered 2014-10-17: 5 mg via ORAL
  Filled 2014-10-17: qty 1

## 2014-10-17 NOTE — Progress Notes (Signed)
One of the two tubes of the hemovac came out thus affecting the suctioning of the second tube. Manual charging of the hemovac showed some blood been pulled out of the second tube hence I left it in place. Rewrapped the stump. MD notified.

## 2014-10-17 NOTE — PMR Pre-admission (Signed)
PMR Admission Coordinator Pre-Admission Assessment  Patient: Scott [name] is an 81 y.o., male MRN: [id number] DOB: 04/04/1922 Height: 5\' 10"  (177.8 cm) Weight: 61.2 kg (134 lb 14.7 oz)              Insurance Information HMO: No    PPO:       PCP:       IPA:       80/20:       OTHER:   PRIMARY: Medicare A/B      Policy#: [id number]      Subscriber: [name] CM Name:        Phone#:       Fax#:   Pre-Cert#:        Employer: Retired Benefits:  Phone #:       Name: Checked in [place]. Date: 09/10/88     Deduct: $1288      Out of Pocket Max: none      Life Max: unlimited CIR: 100%      SNF: 100 days Outpatient: 80%     Co-Pay: 20% Home Health: 100%      Co-Pay: none DME: 80%     Co-Pay: 20% Providers: patient's choice  SECONDARY: BCBS      Policy#: [id number]      Subscriber: [name] CM Name:        Phone#:       Fax#:   Pre-Cert#:        Employer: Retired Benefits:  Phone #: [phone]     Name:   Eff. Date:       Deduct:        Out of Pocket Max:        Life Max:   CIR:        SNF:   Outpatient:       Co-Pay:   Home Health:        Co-Pay:   DME:       Co-Pay:    Emergency Contact Information Contact Information    Name Relation Home Work Mobile   [place]   [place] Relative [phone]  [phone]   Mariotti,Geraldine Spouse [phone]     Archie Balboa Sister   [phone]     Current Medical History  Patient Admitting Diagnosis:  L AKA  History of Present Illness: A 81 y.o. male with history of HTN, BLE DVT/ PE, A fib- chronic coumadin, NICM, severe PVD with gangrenous changes left foot with recommendations for amputation due to non-salvageable limb. Patient elected to undergo L-AKA on 10/16/14 by Dr. Kellie Simmering. Post op with agitation due to delirium as well as ABLA. PT/OT evaluations to be done today. CIR recommended due to anticipated rehab needs.     Past Medical History  Past Medical History  Diagnosis Date  .  HTN (hypertension)   . Hyperlipidemia   . PVCs (premature ventricular contractions)   . Glaucoma   . PVD (peripheral vascular disease)     Right ABI .75, Left .78 (2006)  . Popliteal artery aneurysm, bilateral DECUMENTED CHRONIC PARTIAL OCCLUSION--  PT DENIES CLAUDICATION OR ANY OTHER SYMPTOMS  . Frequency of urination   . Nocturia   . DVT, bilateral lower limbs 03/2013    a. Bilat DVT dx 03/2013.  . Pulmonary embolism     a. By CT angio 04/2013.  Marland Kitchen Elevated troponin     a. Adm 12/30-04/2013 - not felt to represent ACS; ?due to PE. b. Patent cors 06/2012.  Marland Kitchen  NSVT (nonsustained ventricular tachycardia)     a. NSVT 06/2012; NSVT also seen during 03/2013 adm. b. Med rx. Not candidate for ICD given adv age.  Marland Kitchen ETOH abuse   . Syncope     a. Felt to be postural syncope related to diuretics 06/2012.  Marland Kitchen Heart murmur   . DVT (deep venous thrombosis)     "I think in both legs"  . Pneumonia     "couple times"  . DDD (degenerative disc disease)   . Arthritis     "all over"  . Depression   . CAD- non obstructive disease by cath 3/14   . PAD (peripheral artery disease)   . Gangrene of toe 10/03/2014  . Sepsis due to other etiology 10/03/2014  . Chronic systolic heart failure 1/61/09    EF improved 50-55%  . Chronic systolic CHF (congestive heart failure)     a. NICM - patent cors 06/2012, EF 40% at that time. b. 03/2013 eval: EF 20-25%.  . Chronic lower back pain   . Bladder cancer 03/15/2012    pt denies this hx on 10/15/2014    Family History  family history includes Arthritis in his mother; Deep vein thrombosis in his father; Early death in his brother; Hypertension in his mother; Lung cancer in his sister.  Prior Rehab/Hospitalizations: Has had HHPT in the past 3 months.  Has the patient had major surgery during 100 days prior to admission? No  Current Medications   Current facility-administered medications:  .  acetaminophen (TYLENOL) tablet 650 mg, 650 mg, Oral, Q6H PRN **OR**  acetaminophen (TYLENOL) suppository 650 mg, 650 mg, Rectal, Q6H PRN, Samella Parr, NP .  alum & mag hydroxide-simeth (MAALOX/MYLANTA) 200-200-20 MG/5ML suspension 30 mL, 30 mL, Oral, Q6H PRN, Samella Parr, NP .  bisacodyl (DULCOLAX) suppository 10 mg, 10 mg, Rectal, Daily PRN, Ulyses Amor, PA-C .  dextrose 5 %-0.9 % sodium chloride infusion, , Intravenous, Continuous, Samella Parr, NP, Last Rate: 50 mL/hr at 10/15/14 2225, 50 mL/hr at 10/15/14 2225 .  docusate sodium (COLACE) capsule 100 mg, 100 mg, Oral, BID, Samella Parr, NP, 100 mg at 10/17/14 2132 .  donepezil (ARICEPT) tablet 10 mg, 10 mg, Oral, QHS, Samella Parr, NP, 10 mg at 10/17/14 2132 .  febuxostat (ULORIC) tablet 40 mg, 40 mg, Oral, Daily, Samella Parr, NP, 40 mg at 10/17/14 1044 .  folic acid injection 1 mg, 1 mg, Intravenous, Daily, Samella Parr, NP, 1 mg at 10/17/14 1137 .  gabapentin (NEURONTIN) capsule 200 mg, 200 mg, Oral, BID, Samella Parr, NP, 200 mg at 10/17/14 2132 .  guaiFENesin-dextromethorphan (ROBITUSSIN DM) 100-10 MG/5ML syrup 15 mL, 15 mL, Oral, Q4H PRN, Ulyses Amor, PA-C .  haloperidol lactate (HALDOL) injection 1 mg, 1 mg, Intravenous, Q4H PRN, Silver Huguenin Elgergawy, MD .  hydrALAZINE (APRESOLINE) injection 5 mg, 5 mg, Intravenous, Q20 Min PRN, Ulyses Amor, PA-C .  isosorbide mononitrate (IMDUR) 24 hr tablet 15 mg, 15 mg, Oral, Daily, Samella Parr, NP, 15 mg at 10/17/14 1045 .  labetalol (NORMODYNE,TRANDATE) injection 10 mg, 10 mg, Intravenous, Q10 min PRN, Ulyses Amor, PA-C .  magnesium sulfate IVPB 2 g 50 mL, 2 g, Intravenous, Daily PRN, Ulyses Amor, PA-C .  metoprolol (LOPRESSOR) injection 2-5 mg, 2-5 mg, Intravenous, Q2H PRN, Ulyses Amor, PA-C .  metoprolol succinate (TOPROL-XL) 24 hr tablet 50 mg, 50 mg, Oral, Daily, Samella Parr, NP, 50 mg at  10/17/14 1044 .  morphine 2 MG/ML injection 2 mg, 2 mg, Intravenous, Q4H PRN, Gardiner Barefoot, NP, 2 mg at 10/17/14 1045 .   ondansetron (ZOFRAN) tablet 4 mg, 4 mg, Oral, Q6H PRN **OR** ondansetron (ZOFRAN) injection 4 mg, 4 mg, Intravenous, Q6H PRN, Samella Parr, NP .  oxyCODONE (Oxy IR/ROXICODONE) immediate release tablet 5-10 mg, 5-10 mg, Oral, Q4H PRN, Ulyses Amor, PA-C, 10 mg at 10/17/14 2228 .  pantoprazole (PROTONIX) EC tablet 40 mg, 40 mg, Oral, Daily, Ulyses Amor, PA-C, 40 mg at 10/17/14 1043 .  phenol (CHLORASEPTIC) mouth spray 1 spray, 1 spray, Mouth/Throat, PRN, Ulyses Amor, PA-C .  potassium chloride SA (K-DUR,KLOR-CON) CR tablet 20-40 mEq, 20-40 mEq, Oral, Daily PRN, Ulyses Amor, PA-C .  senna-docusate (Senokot-S) tablet 1 tablet, 1 tablet, Oral, QHS PRN, Ulyses Amor, PA-C .  simvastatin (ZOCOR) tablet 10 mg, 10 mg, Oral, Daily, Samella Parr, NP, 10 mg at 10/17/14 1044 .  sodium chloride 0.9 % injection 3 mL, 3 mL, Intravenous, Q12H, Samella Parr, NP, 3 mL at 10/17/14 2132 .  tamsulosin (FLOMAX) capsule 0.4 mg, 0.4 mg, Oral, QPC supper, Samella Parr, NP, 0.4 mg at 10/17/14 1756 .  thiamine (B-1) injection 100 mg, 100 mg, Intravenous, Daily, Samella Parr, NP, 100 mg at 10/17/14 1045 .  Warfarin - Pharmacist Dosing Inpatient, , Does not apply, q1800, Reginia Naas, RPH, 0  at 10/16/14 1800  Patients Current Diet: Diet Heart Room service appropriate?: Yes; Fluid consistency:: Thin  Precautions / Restrictions Precautions Precautions: Fall Restrictions Weight Bearing Restrictions: No LLE Weight Bearing: Non weight bearing   Has the patient had 2 or more falls or a fall with injury in the past year?Yes.  Has had 2 falls in the past year with no injuries.  Prior Activity Level Limited Community (1-2x/wk): Goes out about 3 X a week.  Home Assistive Devices / Equipment Home Assistive Devices/Equipment: Environmental consultant (specify type), Cane (specify quad or straight), Bedside commode/3-in-1, Shower chair without back, Hospital bed, Grab bars in shower, Hand-held shower hose Home  Equipment: Walker - 2 wheels, Cane - single point, Bedside commode, Tub bench, Other (comment)  Prior Device Use: Indicate devices/aids used by the patient prior to current illness, exacerbation or injury? Manual wheelchair and None of the above.  Used a 3 prong cane and a straight cane.  Three weeks ago had to start using a wheelchair.  Prior Functional Level Prior Function Level of Independence: Needs assistance Gait / Transfers Assistance Needed: Pt states that he was using a cane to transfer to the wheelchair and occasionally needed assistance ADL's / Homemaking Assistance Needed: Pt was performing all bathing, dressing, and toileting with supervision up until approximately 3 weeks ago.   Self Care: Did the patient need help bathing, dressing, using the toilet or eating?  Independent.  Was independent up until 3 months ago  Indoor Mobility: Did the patient need assistance with walking from room to room (with or without device)? Independent.  Used a cane up until 3 weeks ago when he began to use a wheelchair.  Stairs: Did the patient need assistance with internal or external stairs (with or without device)? Unknown.  Per son does not have to go up or down stairs.  Functional Cognition: Did the patient need help planning regular tasks such as shopping or remembering to take medications? Needed some help  Current Functional Level Cognition  Overall Cognitive Status: Impaired/Different from baseline Orientation  Level: Oriented to person, Oriented to situation Following Commands: Follows one step commands inconsistently General Comments: Pt appropriate during session and talking to his son on the phone as therapist entered the room.  Able to recall only 1/3 words after approximately 2 min delay.     Extremity Assessment (includes Sensation/Coordination)  Upper Extremity Assessment: Generalized weakness  Lower Extremity Assessment: Defer to PT evaluation RLE Deficits / Details:  generalized weakness LLE Deficits / Details: Pt holding residual limb up in the air due to pain, and had a difficult time relaxing it down on the bed.     ADLs  Eating/Feeding: Independent Grooming: Wash/dry hands, Wash/dry face, Sitting, Supervision/safety Grooming Details (indicate cue type and reason): simulated Upper Body Bathing: Supervision/ safety, Sitting Lower Body Bathing: +2 for physical assistance, Moderate assistance, Sit to/from stand Lower Body Dressing: +2 for physical assistance, Moderate assistance, Sit to/from stand Toilet Transfer: Stand-pivot, RW, Maximal assistance Toileting- Clothing Manipulation and Hygiene: +2 for physical assistance, Moderate assistance Functional mobility during ADLs: Maximal assistance General ADL Comments: Pt needed max assist for sit to stand from the bedside chair with max demonstrational cueing for hand placement with sit to stand.  Pt's son talked with therapist via phone and really wants pt to go to inpatient rehab and not SNF.  He reassured therapist that pt has physical 24 hour care at discharge. Feel he is appropriate to make progess as he was actually able to take 3 step forward with the walker and mod-max assist     Mobility  Overal bed mobility: Needs Assistance Bed Mobility: Supine to Sit Supine to sit: Mod assist, +2 for physical assistance General bed mobility comments: Hand-over-hand assist to reach for the bed rails. Pt was able to help pull himself over to the EOB, and therapist/tech assisted with bed pad.     Transfers  Overall transfer level: Needs assistance Equipment used: Rolling walker (2 wheeled), 2 person hand held assist Transfers: Sit to/from Stand, Squat Pivot Transfers Sit to Stand: Max assist Stand pivot transfers: Max assist Squat pivot transfers: Max assist, +2 physical assistance General transfer comment: Pt was able to maintain static standing with mod assist and take 3 steps for simulated transfer with the RW  with max assist.     Ambulation / Gait / Stairs / Wheelchair Mobility   not assessed, anticipate needs    Posture / Balance Balance Overall balance assessment: Needs assistance Sitting-balance support: Feet supported, Bilateral upper extremity supported Sitting balance-Leahy Scale: Fair Standing balance support: Bilateral upper extremity supported, During functional activity Standing balance-Leahy Scale: Poor Standing balance comment: Pt able to stand for 1-2 mins with mod assist statically using RW for support.    Special needs/care consideration BiPAP/CPAPNo CPM No Continuous Drip IV KVO Dialysis No      Life Vest No Oxygen No Special Bed No Trach Size No Wound Vac (area) No     Skin Has a new L AKA incision with dressing.                              Bowel mgmt: Last documented BM 10/14/14 Bladder mgmt: Voiding with urgency and incontinence at times.  Has leakage. Diabetic mgmt No Contact Isolation for MRSA:  Yes    Previous Home Environment Living Arrangements: Spouse/significant other, Other (Comment) (24 hour caretakers per chart review) Available Help at Discharge: Available 24 hours/day, Personal care attendant Type of Home: House Home Layout: One level  Home Access: Ramped entrance Bathroom Shower/Tub: Multimedia programmer: Standard Home Care Services: Yes Type of Home Care Services: Homehealth aide, Actuary, [place] (if known): "[facility] for wound; private pay for caretaker" Additional Comments: Harrel Lemon lift as pt's wife has paraplegia  Discharge Living Setting Plans for Discharge Living Setting: Patient's home, House, Lives with (comment) (Lives with wife.) Type of Home at Discharge: House Discharge Home Layout: One level Discharge Home Access: Ramped entrance Does the patient have any problems obtaining your medications?: No  Social/Family/Support Systems Patient Roles: Spouse, Parent (Has a wife and a son.)  Wife has been a  paraplegic since 2012 and needs 24/7 care. Contact Information: Saajan Willmon - son Anticipated Caregiver: Has 24/7 caregivers at home Anticipated Caregiver's Contact Information: See emergency contacts Ability/Limitations of Caregiver: son lives 12 miles away.  Wife already has 24/7 caregivers at home. Caregiver Availability: 24/7 Discharge Plan Discussed with Primary Caregiver: Yes Is Caregiver In Agreement with Plan?: Yes Does Caregiver/Family have Issues with Lodging/Transportation while Pt is in Rehab?: No  Goals/Additional Needs Patient/Family Goal for Rehab: PT/OT supervision to min assist goals Expected length of stay: 12-18 days Cultural Considerations: [facility] Dietary Needs: Heart diet, thin liquids Equipment Needs: TBD Pt/Family Agrees to Admission and willing to participate: Yes Program Orientation Provided & Reviewed with Pt/Caregiver Including Roles  & Responsibilities: Yes  Decrease burden of Care through IP rehab admission: N/A  Possible need for SNF placement upon discharge: Yes, if patient does not regain sufficient independence to discharge home, may need SNF for further rehab.  Patient Condition: This patient's condition remains as documented in the consult dated 10-17-14, in which the Rehabilitation Physician determined and documented that the patient's condition is appropriate for intensive rehabilitative care in an inpatient rehabilitation facility. Will admit to inpatient rehab today.  Preadmission Screen Completed By:  Nanetta Batty, PT, 10/18/2014 10:09 AM ______________________________________________________________________   Discussed status with Dr. Naaman Plummer on 10-18-14 at 1009 and received telephone approval for admission today.  Admission Coordinator:  Nanetta Batty, PT, time1009/Date 10-18-14

## 2014-10-17 NOTE — Clinical Social Work Placement (Signed)
   CLINICAL SOCIAL WORK PLACEMENT  NOTE  Date:  10/17/2014  Patient Details  Name: Scott [name] MRN: [id number] Date of Birth: 1921-12-30  Clinical Social Work is seeking post-discharge placement for this patient at the [place] level of care (*CSW will initial, date and re-position this form in  chart as items are completed):  Yes   Patient/family provided with [facility] Work Department's list of facilities offering this level of care within the geographic area requested by the patient (or if unable, by the patient's family).  Yes   Patient/family informed of their freedom to choose among providers that offer the needed level of care, that participate in Medicare, Medicaid or managed care program needed by the patient, have an available bed and are willing to accept the patient.  Yes   Patient/family informed of [facility]'s ownership interest in [facility] and [facility], as well as of the fact that they are under no obligation to receive care at these facilities.  PASRR submitted to EDS on       PASRR number received on       Existing PASRR number confirmed on       FL2 transmitted to all facilities in geographic area requested by pt/family on 10/17/14     FL2 transmitted to all facilities within larger geographic area on       Patient informed that his/her managed care company has contracts with or will negotiate with certain facilities, including the following:            Patient/family informed of bed offers received.  Patient chooses bed at       Physician recommends and patient chooses bed at      Patient to be transferred to   on  .  Patient to be transferred to facility by       Patient family notified on   of transfer.  Name of family member notified:        PHYSICIAN Please sign FL2, Please sign DNR     Additional Comment:    _______________________________________________ Cranford Mon, LCSW 10/17/2014, 1:57  PM

## 2014-10-17 NOTE — Progress Notes (Signed)
ANTICOAGULATION CONSULT NOTE - Follow Up Consult  Pharmacy Consult for Coumadin Indication: PE and DVT  Allergies  Allergen Reactions  . Lipitor [Atorvastatin] Other (See Comments)    unknown    Patient Measurements: Weight: 134 lb 11.2 oz (61.1 kg)  Vital Signs:    Labs:  Recent Labs  10/15/14 1729 10/16/14 0350 10/17/14 0328 10/17/14 0830  HGB 9.4* 9.2* 8.1*  --   HCT 29.1* 28.4* 25.1*  --   PLT 359 334 339  --   APTT  --  29  --   --   LABPROT 22.0* 21.4* 22.1*  --   INR 1.93* 1.86* 1.94*  --   CREATININE 1.48* 1.26*  --  1.02    Estimated Creatinine Clearance: 40.8 mL/min (by C-G formula based on Cr of 1.02).   Assessment: 81yo male with history of b/l DVT and PE on coumadin PTA here for AKA on 7/6. Has been on bridge with enoxaparin prior to procedure. Pharmacy is consulted to restart coumadin s/p AKA. INR trending back up to 1.94. Hgb dropped a little s/p AKA. Plts wnl. Hemovac showed some blood this am, but no s/s of active bleed.  Goal of Therapy:  INR 2-3 Monitor platelets by anticoagulation protocol: Yes   Plan:  Give coumadin 5mg  PO x 1 Monitor daily INR, CBC, s/s of bleed  Skipper Dacosta J 10/17/2014,10:10 AM

## 2014-10-17 NOTE — Evaluation (Signed)
Physical Therapy Evaluation Patient Details Name: Scott [name] MRN: [id number] DOB: 1921/07/24 Today's Date: 10/17/2014   History of Present Illness  Pt is a 81 y.o. 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 Pt was recently treated for gangrenous ulcers on L toes, d/c 6/28. 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  Clinical Impression  Pt admitted with above diagnosis. Pt currently with functional limitations due to the deficits listed below (see PT Problem List). At the time of PT eval pt was able to perform transfers with +2 assist. Was not able to manage the RW at this time, and bed pad was used for added hip support during transfer to recliner. Pt will benefit from skilled PT to increase their independence and safety with mobility to allow discharge to the venue listed below.       Follow Up Recommendations SNF;Supervision/Assistance - 24 hour    Equipment Recommendations  None recommended by PT    Recommendations for Other Services       Precautions / Restrictions Precautions Precautions: Fall Restrictions Weight Bearing Restrictions: Yes LLE Weight Bearing: Non weight bearing      Mobility  Bed Mobility Overal bed mobility: Needs Assistance Bed Mobility: Supine to Sit     Supine to sit: Mod assist;+2 for physical assistance     General bed mobility comments: Hand-over-hand assist to reach for the bed rails. Pt was able to help pull himself over to the EOB, and therapist/tech assisted with bed pad.   Transfers Overall transfer level: Needs assistance Equipment used: Rolling walker (2 wheeled);2 person hand held assist Transfers: Sit to/from [employer] Sit to Stand: Max assist;+2 physical assistance   Squat pivot  transfers: Max assist;+2 physical assistance     General transfer comment: Attempted sit<>stand with RW and pt was unable to achieve full standing. Walker removed and bed pad was used to help support hips during squat pivot to chair with +2 assist.   Ambulation/Gait                Stairs            Wheelchair Mobility    Modified Rankin (Stroke Patients Only)       Balance Overall balance assessment: Needs assistance Sitting-balance support: Feet supported;Bilateral upper extremity supported Sitting balance-Leahy Scale: Fair     Standing balance support: Bilateral upper extremity supported;During functional activity Standing balance-Leahy Scale: Zero                               Pertinent Vitals/Pain Pain Assessment: Faces Faces Pain Scale: Hurts even more Pain Location: L residual limb during transfer Pain Descriptors / Indicators: Operative site guarding Pain Intervention(s): Limited activity within patient's tolerance;Monitored during session;Repositioned    Home Living Family/patient expects to be discharged to:: Skilled nursing facility Living Arrangements: Spouse/significant other;Other (Comment) (24 hour caretakers per chart review)                    Prior Function Level of Independence: Needs assistance   Gait / Transfers Assistance Needed: Pt states that he was using a cane to transfer to the wheelchair and occasionally needed assistance           Hand Dominance   Dominant Hand:  Right    Extremity/Trunk Assessment   Upper Extremity Assessment: Defer to OT evaluation           Lower Extremity Assessment: RLE deficits/detail;LLE deficits/detail RLE Deficits / Details: generalized weakness LLE Deficits / Details: Pt holding residual limb up in the air due to pain, and had a difficult time relaxing it down on the bed.   Cervical / Trunk Assessment: Kyphotic  Communication   Communication: No difficulties   Cognition Arousal/Alertness: Awake/alert Behavior During Therapy: WFL for tasks assessed/performed Overall Cognitive Status: Impaired/Different from baseline Area of Impairment: Following commands;Awareness;Problem solving       Following Commands: Follows one step commands inconsistently;Follows one step commands with increased time   Awareness: Emergent Problem Solving: Slow processing;Decreased initiation;Difficulty sequencing;Requires verbal cues;Requires tactile cues General Comments: Per nursing, pt has been confused at times during the morning, not aware that his leg had been amputated, and was combative at times.    General Comments      Exercises        Assessment/Plan    PT Assessment Patient needs continued PT services  PT Diagnosis Generalized weakness   PT Problem List Decreased strength;Decreased range of motion;Decreased activity tolerance;Decreased balance;Decreased mobility;Decreased knowledge of use of DME;Decreased safety awareness;Decreased knowledge of precautions;Pain  PT Treatment Interventions Functional mobility training;Therapeutic exercise   PT Goals (Current goals can be found in the Care Plan section) Acute Rehab PT Goals Patient Stated Goal: Pt did not state goals during session. PT Goal Formulation: With family Time For Goal Achievement: 10/31/14 Potential to Achieve Goals: Fair    Frequency Min 3X/week   Barriers to discharge        Co-evaluation               End of Session Equipment Utilized During Treatment: Gait belt Activity Tolerance: Patient tolerated treatment well Patient left: in chair;with call bell/phone within reach;with nursing/sitter in room Nurse Communication: Mobility status;Need for lift equipment         Time: 1505-6979 PT Time Calculation (min) (ACUTE ONLY): 19 min   Charges:   PT Evaluation $Initial PT Evaluation Tier I: 1 Procedure     PT G Codes:        Rolinda Roan 11-07-2014, 12:58  PM  Rolinda Roan, PT, DPT Acute Rehabilitation Services Pager: [phone]

## 2014-10-17 NOTE — Progress Notes (Signed)
UR Completed. Fares Ramthun, RN, BSN.  [phone] 

## 2014-10-17 NOTE — Progress Notes (Addendum)
  [facility] Progress Note  Subjective  - POD #1  Agitated yesterday and pulled out lines last night. Hemovac tubing came loose last night and stump was rewrapped.   Objective Filed Vitals:   10/16/14 2206  BP: 126/45  Pulse: 93  Resp: 17    Intake/Output Summary (Last 24 hours) at 10/17/14 0749 Last data filed at 10/17/14 0700  Gross per 24 hour  Intake    400 ml  Output    100 ml  Net    300 ml    Left AKA dressing clean and dry  Assessment/Planning: 81 y.o. male is s/p: Left above-knee-amputation for non-healing wound of right lower extremity 1 Day Post-Op   Will take down dressing tomorrow.  Acute surgical blood loss anemia Hgb 8.1 today. Asymptomatic. Follow.  PT/OT Consult to CIR  Alvia Grove 10/17/2014 7:49 AM --  Laboratory CBC    Component Value Date/Time   WBC 11.9* 10/17/2014 0328   WBC 4.6 07/30/2014 1402   WBC 4.7 03/26/2014 1446   WBC 3.5* 01/13/2011 1349   HGB 8.1* 10/17/2014 0328   HGB 10.7* 07/30/2014 1402   HGB 11.5* 01/13/2011 1349   HCT 25.1* 10/17/2014 0328   HCT 36.0* 07/30/2014 1402   HCT 33.7* 01/13/2011 1349   PLT 339 10/17/2014 0328   PLT 207 01/13/2011 1349    BMET    Component Value Date/Time   NA 141 10/16/2014 0350   NA 139 08/26/2014 1620   K 5.1 10/16/2014 0350   CL 109 10/16/2014 0350   CO2 24 10/16/2014 0350   GLUCOSE 92 10/16/2014 0350   GLUCOSE 112* 08/26/2014 1620   BUN 25* 10/16/2014 0350   BUN 45* 08/26/2014 1620   CREATININE 1.26* 10/16/2014 0350   CREATININE 0.91 11/02/2012 1310   CALCIUM 9.4 10/16/2014 0350   GFRNONAA 48* 10/16/2014 0350   GFRNONAA 74 11/02/2012 1310   GFRAA 56* 10/16/2014 0350   GFRAA 86 11/02/2012 1310    COAG Lab Results  Component Value Date   INR 1.94* 10/17/2014   INR 1.86* 10/16/2014   INR 1.93* 10/15/2014   No results found for: PTT  Antibiotics Anti-infectives    Start     Dose/Rate Route Frequency Ordered Stop   10/16/14 2100  cefUROXime  (ZINACEF) 1.5 g in dextrose 5 % 50 mL IVPB     1.5 g 100 mL/hr over 30 Minutes Intravenous Every 12 hours 10/16/14 1157 10/17/14 2059   10/16/14 0800  cefUROXime (ZINACEF) 1.5 g in dextrose 5 % 50 mL IVPB     1.5 g 100 mL/hr over 30 Minutes Intravenous To Surgery 10/15/14 1700 10/16/14 0853   10/15/14 2200  amoxicillin (AMOXIL) capsule 500 mg  Status:  Discontinued     500 mg Oral Every 12 hours 10/15/14 1549 10/16/14 1157       Virgina Jock, PA-C [facility] Office: [phone] Pager: [phone] 10/17/2014 7:49 AM  Agree with above assessment Right Hemovac tubing was pulled out by patient last night left one was removed today. Wound looks good. Stump rewrapped

## 2014-10-17 NOTE — Progress Notes (Signed)
Rehab admissions - Evaluated for possible admission.  I had a long discussion with son who would like inpatient rehab admission for patient.  Wife has 24/7 care at home.  Patient will have care at home as needed.  Son prefers inpatient rehab to give patient the best chance to return to independence.  I will have my partner, Sherlyn Hay, follow up tomorrow and she can be reached at [phone].  I can be reached at [phone].

## 2014-10-17 NOTE — Care Management Note (Addendum)
Case Management Note  Patient Details  Name: Scott [name] MRN: [id number] Date of Birth: 30-Dec-1921  Subjective/Objective:    Pt admitted with ischemic lower left foot            Action/Plan:  Pt had left above the knee amputation 09/16/14.  Pt is from home with 24/7 sitters privately paid.  Pt also has [facility] PT through [facility].  Pt son [name]) is POA.  CM will continue to monitor for disposition needs.    Expected Discharge Date:                  Expected Discharge Plan:     In-House Referral:  CM  Discharge planning Services     Post Acute Care Choice:    Choice offered to:     DME Arranged:    DME Agency:     HH Arranged:   HH Agency:     Status of Service:     Medicare Important Message Given: Yes  Date Medicare IM Given:  10/17/2014   Medicare IM give by:  Elenor Quinones Date Additional Medicare IM Given:    Additional Medicare Important Message give by:     If discussed at [place] of Stay Meetings, dates discussed:    Additional Comments: [facility] and informed of pt admit.  CM will request resumption orders if pt discharges home this admit. Maryclare Labrador, RN 10/17/2014, 11:31 AM

## 2014-10-17 NOTE — Clinical Social Work Note (Signed)
Clinical Social Work Assessment  Patient Details  Name: Scott [name] MRN: [id number] Date of Birth: 06-05-1921  Date of referral:  10/17/14               Reason for consult:                   Permission sought to share information with:  Facility Sport and exercise psychologist, Family Supports Permission granted to share information::  No (pt disoriented)  Name::     Scott [name]::  [facility]  Relationship::  son  Contact Information:     Housing/Transportation Living arrangements for the past 2 months:    Source of Information:    Patient Interpreter Needed:    Criminal Activity/Legal Involvement Pertinent to Current Situation/Hospitalization:    Significant Relationships:  Adult Children, Spouse Lives with:  Adult Children, Spouse Do you feel safe going back to the place where you live?  Yes Need for family participation in patient care:  Yes (Comment)  Care giving concerns:  Pt lives at home with adult son and wife.  Pt son reports that pt spouse is a parapelgic and requires a great deal of assitance herself- pt son does not think he could take care of pt spouse and the pt given pt current level of mobility   Facilities manager / plan:  CSW spoke with pt son about Dr recommendation for rehab  Employment status:  Retired Forensic scientist:  Medicare PT Recommendations:  [place] / Referral to community resources:  [place]  Patient/Family's Response to care:  Pt son is agreeable to SNF search but is hopeful that pt will be able to go to CIR before SNF like his mother did when she was admitted a few years ago- CSW clarified process and pt son is agreeable to SNF bed search as CIR alternative   Patient/Family's Understanding of and Emotional Response to Diagnosis, Current Treatment, and Prognosis:  Pt son expressed a good understanding of pt condition and did not express concerns/ questions with current treatment  plan  Emotional Assessment Appearance:    Attitude/Demeanor/Rapport:  Unable to Assess Affect (typically observed):  Unable to Assess Orientation:  Fluctuating Orientation (Suspected and/or reported Sundowners) Alcohol / Substance use:  Not Applicable Psych involvement (Current and /or in the community):  No (Comment)  Discharge Needs  Concerns to be addressed:  Care Coordination Readmission within the last 30 days:  Yes Current discharge risk:  Physical Impairment Barriers to Discharge:  Requiring sitter/restraints, Continued Medical Work up   [employer], [place], LCSW 10/17/2014, 1:17 PM

## 2014-10-17 NOTE — Progress Notes (Addendum)
Patient Demographics  Scott [name], is a 81 y.o. male, DOB - [birth year], OFV:[id number]  Admit date - 10/15/2014   Admitting Physician Theodis Blaze, MD  Outpatient Primary MD for the patient is Redge Gainer, MD  LOS - 2   No chief complaint on file.      Admission HPI/Brief narrative: 81 year old male  with known severe peripheral vascular disease, nonischemic cardiomyopathy with EF 20-25% in December 2015 but as of June 2016 EF now up to 50-55% with grade 1 diastolic dysfunction, history of DVT with PE in 2014 on chronic warfarin, recently treated for gangrenous ulcers on the left toes, discharge 10/08/14. Dr. Kellie Simmering recommended an above the knee amputation noting patient was not a candidate for an open bypass procedure. Patient admitted 10/15/14 the medical team for surgical procedure, patient went left above-knee amputation by Dr. Kellie Simmering 08/16/14. Subjective:   [name] today has, No headache, No chest pain, No abdominal pain - No Nausea, no cough or shortness of breath .  Assessment & Plan    Principal Problem:   Ischemic ulcer of left foot Active Problems:   HTN (hypertension)   Hyperlipidemia   CAD- non obstructive disease by cath 3/14   Chronic diastolic heart failure, NYHA class 1   DVT of lower extremity, bilateral   History of pulmonary embolism (2014)   BPH (benign prostatic hyperplasia)   Atherosclerotic PVD with ulceration  Ischemic ulcer of left foot secondary to PVD - Not a candidate for open bypass procedure, underwent left above-knee amputation 10/16/14 by Dr. Kellie Simmering. - provide analgesia as needed  - Management as per vascular surgery. - Continue with IV antibiotics, would consider to stop if remains a febrile, leukocytosis has resolved.  CAD- non obstructive disease by cath 3/14 - Denies any chest pain or shortness of breath, continue with beta blockers, and statin   Chronic  combined systolic and diastolic heart failure, NYHA class 1 - compensated ,  last 2 D ECHO, EF has normalized to 50% - monitor daily weights, strict I/O - weight on admission 70.6 kg - Continue with metoprolol, Imdur and hydralazine, no ACE inhibitor or ARB given his renal failure.  Anemia - Acute blood loss anemia postoperatively, and glycohemoglobin today is 8.1 ,will monitor closely, transfuse as needed.   History of pulmonary embolism in 2014/DVT of lower extremity, bilateral - on  warfarin   CKD stage IV - last admission, Cr as high as 2 - Creatinine is 1.02 today  Dementia, with acute hospital delirium -Continue Aricept, had a sitter yesterday giving his delirium, required IV Haldol   BPH /history prostate cancer -Continue Flomax  Code Status: DNR  Family Communication: None at bedside  Disposition Plan: Evaluated by CIR   Procedures  Left AKA 7/6    Consults  Vascular SX   Medications  Scheduled Meds: . docusate sodium  100 mg Oral BID  . donepezil  10 mg Oral QHS  . febuxostat  40 mg Oral Daily  . folic acid  1 mg Intravenous Daily  . gabapentin  200 mg Oral BID  . isosorbide mononitrate  15 mg Oral Daily  . metoprolol succinate  50 mg Oral Daily  . pantoprazole  40 mg Oral Daily  . simvastatin  10 mg Oral Daily  . sodium chloride  3 mL Intravenous Q12H  . tamsulosin  0.4 mg Oral QPC supper  . thiamine  100 mg Intravenous Daily  . warfarin  5 mg Oral ONCE-1800  . warfarin  5 mg Oral ONCE-1800  . Warfarin - Pharmacist Dosing Inpatient   Does not apply q1800   Continuous Infusions: . dextrose 5 % and 0.9% NaCl 50 mL/hr (10/15/14 2225)   PRN Meds:.acetaminophen **OR** acetaminophen, alum & mag hydroxide-simeth, bisacodyl, guaiFENesin-dextromethorphan, haloperidol lactate, hydrALAZINE, labetalol, magnesium sulfate 1 - 4 g bolus IVPB, metoprolol, morphine injection, ondansetron **OR** ondansetron (ZOFRAN) IV, oxyCODONE, phenol, potassium chloride,  senna-docusate  DVT Prophylaxis  On warfarin  Lab Results  Component Value Date   PLT 339 10/17/2014    Antibiotics   Anti-infectives    Start     Dose/Rate Route Frequency Ordered Stop   10/16/14 2100  cefUROXime (ZINACEF) 1.5 g in dextrose 5 % 50 mL IVPB     1.5 g 100 mL/hr over 30 Minutes Intravenous Every 12 hours 10/16/14 1157 10/17/14 1113   10/16/14 0800  cefUROXime (ZINACEF) 1.5 g in dextrose 5 % 50 mL IVPB     1.5 g 100 mL/hr over 30 Minutes Intravenous To Surgery 10/15/14 1700 10/16/14 0853   10/15/14 2200  amoxicillin (AMOXIL) capsule 500 mg  Status:  Discontinued     500 mg Oral Every 12 hours 10/15/14 1549 10/16/14 1157          Objective:   Filed Vitals:   10/16/14 1330 10/16/14 1400 10/16/14 2206 10/17/14 0621  BP: 134/42 112/93 126/45   Pulse: 86 88 93   Temp:   97.3 F (36.3 C)   TempSrc:   Oral   Resp:   17   Weight:    61.1 kg (134 lb 11.2 oz)  SpO2:  100% 92%     Wt Readings from Last 3 Encounters:  10/17/14 61.1 kg (134 lb 11.2 oz)  10/07/14 70.6 kg (155 lb 10.3 oz)  09/24/14 74.39 kg (164 lb)     Intake/Output Summary (Last 24 hours) at 10/17/14 1312 Last data filed at 10/17/14 0700  Gross per 24 hour  Intake      0 ml  Output      0 ml  Net      0 ml     Physical Exam  Patient is awake, communicative,  [place].AT,PERRAL Supple Neck,No JVD,  Symmetrical Chest wall movement, Good air movement bilaterally,  RRR,No Gallops,Rubs  +ve B.Sounds, Abd Soft, No tenderness, No organomegaly appriciated, No rebound - guarding or rigidity. No Cyanosis, Clubbingand right lower extremity, +1 edema, left lower extremity with above-knee amputation , with staples, wound looks clean, no bleeding or discharge. Data Review   Micro Results Recent Results (from the past 240 hour(s))  Surgical pcr screen     Status: Abnormal   Collection Time: 10/15/14  6:07 PM  Result Value Ref Range Status   MRSA, PCR POSITIVE (A) NEGATIVE Final   Staphylococcus  aureus POSITIVE (A) NEGATIVE Final    Comment:        The Xpert SA Assay (FDA approved for NASAL specimens in patients over 73 years of age), is one component of a comprehensive surveillance program.  Test performance has been validated by [facility] for patients greater than or equal to 70 year old. It is not intended to diagnose infection nor to guide or monitor treatment.     Radiology Reports Dg Chest 1  View  10/03/2014   CLINICAL DATA:  Chest congestion and shortness of breath. The patient reports declining health for 3 months.  EXAM: CHEST  1 VIEW  COMPARISON:  PA and lateral chest 03/26/2014.  FINDINGS: The lungs are clear. Heart size is upper normal. No pneumothorax or pleural effusion. No focal bony abnormality.  IMPRESSION: No acute disease.   Electronically Signed   By: Inge Rise M.D.   On: 10/03/2014 17:36   Ct Head Wo Contrast  10/03/2014   CLINICAL DATA:  Altered mental status decline for 3 months.  EXAM: CT HEAD WITHOUT CONTRAST  TECHNIQUE: Contiguous axial images were obtained from the base of the skull through the vertex without intravenous contrast.  COMPARISON:  CT 07/10/2013  FINDINGS: No acute intracranial hemorrhage. No focal mass lesion. No CT evidence of acute infarction. No midline shift or mass effect. No hydrocephalus. Basilar cisterns are patent.  Remote infarction in the left centrum semiovale. There is generalized cortical atrophy. Partial ventricular dilatation. Mild periventricular white matter hypodensities.  13 mm round lesion in the sella which have has low-density is not changed from prior.  IMPRESSION: 1. No acute intracranial findings. 2. Chronic atrophy and white matter microvascular disease. 3. 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 4. Stable low-density cystic lesion in the sella   Electronically Signed   By: Suzy Bouchard M.D.   On: 10/03/2014 14:16   Dg Chest Port 1 View  10/15/2014   CLINICAL DATA:  Pre-op for left-sided above  the knee amputation. History of hypertension and cardiac catheterization. Initial encounter.  EXAM: PORTABLE CHEST - 1 VIEW  COMPARISON:  10/05/2014 and 10/03/2014 radiographs.  CT 04/10/2013.  FINDINGS: 1638 hours. The heart size and mediastinal contours are stable. There is aortic atherosclerosis with stable ectasia of the right brachiocephalic artery. There is interval improved aeration of the lungs which are now clear. There is some residual blunting of the right costophrenic angle which may reflect a small right-sided pleural effusion. The bones appear unremarkable.  IMPRESSION: Interval improved aeration of the lungs with possible small residual right pleural effusion. No acute findings.   Electronically Signed   By: Richardean Sale M.D.   On: 10/15/2014 16:47   Dg Chest Port 1 View  10/05/2014   CLINICAL DATA:  Shortness of breath, cough, congestion, wheezing.  EXAM: PORTABLE CHEST - 1 VIEW  COMPARISON:  10/03/2014; 03/26/2014; 03/14/2013 ; chest CT- 04/10/2013  FINDINGS: Grossly unchanged enlarged cardiac silhouette and mediastinal contours given decreased lung volumes and patient rotation. Atherosclerotic plaque within thoracic aorta. There is persistent thickening of the right paratracheal stripe secondary to prominent vasculature as demonstrated on prior chest CT performed 03/2013. Pulmonary venous congestion without frank evidence of edema. Worsening bibasilar and right perihilar opacities, right greater than left. No definite pleural effusion or pneumothorax. Unchanged bones.  IMPRESSION: Worsening right perihilar and bibasilar opacities, right greater than left, possibly atelectasis fell aspiration and/or infection could have a similar appearance. Further evaluation with a PA and lateral chest radiograph may be obtained as clinically indicated.   Electronically Signed   By: Sandi Mariscal M.D.   On: 10/05/2014 08:59   Dg Toe 5th Left  10/03/2014   CLINICAL DATA:  One month history of persistent skin  ulceration. Pain and swelling.  EXAM: DG TOE 5TH LEFT  COMPARISON:  09/12/2014  FINDINGS: Knee PIP joint appears slightly narrowed compared to the prior study. There is air surrounding the joint and there is slight cortical irregularity along  the medial aspect of the distal aspect of the proximal phalanx. Could not exclude septic arthritis and osteomyelitis. MRI is recommended for further evaluation.  IMPRESSION: Diffuse soft tissue swelling involving the fifth toe with air in the soft tissues.  Could not exclude septic arthritis and osteomyelitis centered at the PIP joint of the fifth toe. MRI without and with contrast is recommended for further evaluation.   Electronically Signed   By: Marijo Sanes M.D.   On: 10/03/2014 12:53     CBC  Recent Labs Lab 10/15/14 1729 10/16/14 0350 10/17/14 0328  WBC 7.1 6.4 11.9*  HGB 9.4* 9.2* 8.1*  HCT 29.1* 28.4* 25.1*  PLT 359 334 339  MCV 82.9 82.6 83.4  MCH 26.8 26.7 26.9  MCHC 32.3 32.4 32.3  RDW 14.8 14.7 15.0    Chemistries   Recent Labs Lab 10/15/14 1729 10/16/14 0350 10/17/14 0830  NA 139 141 143  K 4.2 5.1 4.3  CL 106 109 110  CO2 26 24 25   GLUCOSE 109* 92 103*  BUN 31* 25* 17  CREATININE 1.48* 1.26* 1.02  CALCIUM 9.0 9.4 9.1  MG 1.9  --   --   AST 45*  --   --   ALT 43  --   --   ALKPHOS 98  --   --   BILITOT 0.3  --   --    ------------------------------------------------------------------------------------------------------------------ estimated creatinine clearance is 40.8 mL/min (by C-G formula based on Cr of 1.02). ------------------------------------------------------------------------------------------------------------------ No results for input(s): HGBA1C in the last 72 hours. ------------------------------------------------------------------------------------------------------------------ No results for input(s): CHOL, HDL, LDLCALC, TRIG, CHOLHDL, LDLDIRECT in the last 72  hours. ------------------------------------------------------------------------------------------------------------------ No results for input(s): TSH, T4TOTAL, T3FREE, THYROIDAB in the last 72 hours.  Invalid input(s): FREET3 ------------------------------------------------------------------------------------------------------------------ No results for input(s): VITAMINB12, FOLATE, FERRITIN, TIBC, IRON, RETICCTPCT in the last 72 hours.  Coagulation profile  Recent Labs Lab 10/15/14 1729 10/16/14 0350 10/17/14 0328  INR 1.93* 1.86* 1.94*    No results for input(s): DDIMER in the last 72 hours.  Cardiac Enzymes No results for input(s): CKMB, TROPONINI, MYOGLOBIN in the last 168 hours.  Invalid input(s): CK ------------------------------------------------------------------------------------------------------------------ Invalid input(s): POCBNP     Time Spent in minutes   30 minutes   Caretha Rumbaugh M.D on 10/17/2014 at 1:12 PM  Between 7am to 7pm - Pager - [phone]  After 7pm go to [email or web address] - password [facility]  [facility]   Office  [phone]

## 2014-10-17 NOTE — Evaluation (Signed)
Occupational Therapy Evaluation Patient Details Name: Scott [name] MRN: [id number] DOB: [birth year] Today's Date: 10/17/2014    History of Present Illness Pt is a 81 y.o. 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 Pt was recently treated for gangrenous ulcers on L toes, d/c 6/28. 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   Clinical Impression   Pt overall needs max assist for sit to stand and mod assist - max assist +2 for toileting tasks and LB dressing tasks.  He was able to stand with the walker and overall mod assist for greater than 2 mins and took 3 small hops forward and backwards with max assist as well.  Feel he will benefit from acute care OT to help regain functional independence for return home.  Per son (via telephone) pt has 24 hour physical assist via hired caregivers so feel pt will benefit from CIR consult to progress to min assist/supervision level for return home with 24 hour assist.     Follow Up Recommendations  CIR;Supervision/Assistance - 24 hour    Equipment Recommendations  None recommended by OT       Precautions / Restrictions Precautions Precautions: Fall Restrictions Weight Bearing Restrictions: No LLE Weight Bearing: Non weight bearing      Mobility Bed Mobility Overal bed mobility: Needs Assistance Bed Mobility: Supine to Sit     Supine to sit: Mod assist;+2 for physical assistance     General bed mobility comments: Hand-over-hand assist to reach for the bed rails. Pt was able to help pull himself over to the EOB, and therapist/tech assisted with bed pad.   Transfers Overall transfer level: Needs assistance Equipment used: Rolling walker (2 wheeled);2 person hand held assist Transfers: Sit to/from [employer] Sit to Stand: Max assist Stand pivot transfers: Max assist Squat pivot transfers: Max assist;+2 physical assistance     General transfer comment: Pt was able to maintain static standing with mod assist and take 3 steps for simulated transfer with the RW with max assist.     Balance Overall balance assessment: Needs assistance Sitting-balance support: Feet supported;Bilateral upper extremity supported Sitting balance-Leahy Scale: Fair     Standing balance support: Bilateral upper extremity supported;During functional activity Standing balance-Leahy Scale: Poor Standing balance comment: Pt able to stand for 1-2 mins with mod assist statically using RW for support.                            ADL   Eating/Feeding: Independent   Grooming: Wash/dry hands;Wash/dry face;Sitting;Supervision/safety Grooming Details (indicate cue type and reason): simulated Upper Body Bathing: Supervision/ safety;Sitting   Lower Body Bathing: +2 for physical assistance;Moderate assistance;Sit to/from stand       Lower Body Dressing: +2 for physical assistance;Moderate assistance;Sit to/from stand   Toilet Transfer: Stand-pivot;RW;Maximal assistance   Toileting- Water quality scientist and Hygiene: +2 for physical assistance;Moderate assistance       Functional mobility during ADLs: Maximal assistance General ADL Comments: Pt needed max assist for sit to stand from the bedside chair with max demonstrational cueing for hand placement with sit to stand.  Pt's son talked with therapist via phone and really wants pt to go to inpatient rehab and not SNF.  He reassured therapist that pt has physical 24 hour care at  discharge. Feel he is appropriate to make progess as he was actually able to take 3 step forward with the walker and mod-max assist      Vision Vision Assessment?: No apparent visual deficits   Perception Perception Perception Tested?: No   Praxis Praxis Praxis  tested?: Not tested    Pertinent Vitals/Pain Pain Assessment: Faces Faces Pain Scale: Hurts a little bit Pain Location: left leg Pain Descriptors / Indicators: Operative site guarding Pain Intervention(s): Monitored during session;Premedicated before session;Repositioned     Hand Dominance Right   Extremity/Trunk Assessment Upper Extremity Assessment Upper Extremity Assessment: Generalized weakness   Lower Extremity Assessment Lower Extremity Assessment: Defer to PT evaluation RLE Deficits / Details: generalized weakness LLE Deficits / Details: Pt holding residual limb up in the air due to pain, and had a difficult time relaxing it down on the bed.    Cervical / Trunk Assessment Cervical / Trunk Assessment: Kyphotic   Communication Communication Communication: No difficulties   Cognition Arousal/Alertness: Awake/alert Behavior During Therapy: WFL for tasks assessed/performed Overall Cognitive Status: Impaired/Different from baseline Area of Impairment: Orientation;Memory;Following commands;Problem solving       Following Commands: Follows one step commands inconsistently   Awareness: Emergent Problem Solving: Slow processing;Difficulty sequencing;Requires verbal cues;Requires tactile cues General Comments: Pt appropriate during session and talking to his son on the phone as therapist entered the room.  Able to recall only 1/3 words after approximately 2 min delay.               Home Living Family/patient expects to be discharged to:: Private residence Living Arrangements: Spouse/significant other;Other (Comment) (24 hour caretakers per chart review) Available Help at Discharge: Available 24 hours/day;Personal care attendant Type of Home: House Home Access: Ramped entrance     Home Layout: One level     Bathroom Shower/Tub: Occupational psychologist: Standard     Home Equipment: Environmental consultant - 2 wheels;Cane - single point;Bedside commode;Tub bench;Other  (comment)   Additional Comments: Hoyer lift as pt's wife has paraplegia      Prior Functioning/Environment Level of Independence: Needs assistance  Gait / Transfers Assistance Needed: Pt states that he was using a cane to transfer to the wheelchair and occasionally needed assistance ADL's / Homemaking Assistance Needed: Pt was performing all bathing, dressing, and toileting with supervision up until approximately 3 weeks ago.         OT Diagnosis: Generalized weakness;Cognitive deficits;Acute pain   OT Problem List: Decreased strength;Decreased activity tolerance;Impaired balance (sitting and/or standing);Pain;Decreased cognition;Decreased knowledge of use of DME or AE   OT Treatment/Interventions: Self-care/ADL training;Patient/family education;Balance training;Neuromuscular education;Therapeutic activities;DME and/or AE instruction    OT Goals(Current goals can be found in the care plan section) Acute Rehab OT Goals Patient Stated Goal: Pt did not state during session but participated well with all tasks asked to perform. OT Goal Formulation: With patient Time For Goal Achievement: 10/31/14 Potential to Achieve Goals: Good  OT Frequency: Min 2X/week              End of Session Equipment Utilized During Treatment: Gait belt;Rolling walker Nurse Communication: Mobility status  Activity Tolerance: Patient tolerated treatment well Patient left: in chair;with call bell/phone within reach;with nursing/sitter in room;with family/visitor present   Time: 7902-4097 OT Time Calculation (min): 35 min Charges:  OT General Charges $OT Visit: 1 Procedure OT Evaluation $Initial OT Evaluation Tier I: 1 Procedure OT Treatments $Self Care/Home Management : 23-37 mins  Scott [name],Scott [name] OTR/L 10/17/2014, 2:05 PM

## 2014-10-18 ENCOUNTER — Inpatient Hospital Stay (HOSPITAL_COMMUNITY)
Admission: RE | Admit: 2014-10-18 | Discharge: 2014-11-04 | DRG: 560 | Disposition: A | Discharge status: Skilled Nursing Facility | Payer: Medicare Other | Source: Intra-hospital | Attending: Physical Medicine & Rehabilitation | Admitting: Physical Medicine & Rehabilitation

## 2014-10-18 DIAGNOSIS — Z7901 Long term (current) use of anticoagulants: Secondary | ICD-10-CM | POA: Diagnosis not present

## 2014-10-18 DIAGNOSIS — I2510 Atherosclerotic heart disease of native coronary artery without angina pectoris: Secondary | ICD-10-CM | POA: Diagnosis not present

## 2014-10-18 DIAGNOSIS — Z4781 Encounter for orthopedic aftercare following surgical amputation: Principal | ICD-10-CM

## 2014-10-18 DIAGNOSIS — I10 Essential (primary) hypertension: Secondary | ICD-10-CM | POA: Diagnosis not present

## 2014-10-18 DIAGNOSIS — I5032 Chronic diastolic (congestive) heart failure: Secondary | ICD-10-CM

## 2014-10-18 DIAGNOSIS — I129 Hypertensive chronic kidney disease with stage 1 through stage 4 chronic kidney disease, or unspecified chronic kidney disease: Secondary | ICD-10-CM

## 2014-10-18 DIAGNOSIS — N400 Enlarged prostate without lower urinary tract symptoms: Secondary | ICD-10-CM

## 2014-10-18 DIAGNOSIS — Z86711 Personal history of pulmonary embolism: Secondary | ICD-10-CM | POA: Diagnosis not present

## 2014-10-18 DIAGNOSIS — F0390 Unspecified dementia without behavioral disturbance: Secondary | ICD-10-CM | POA: Diagnosis not present

## 2014-10-18 DIAGNOSIS — I5042 Chronic combined systolic (congestive) and diastolic (congestive) heart failure: Secondary | ICD-10-CM | POA: Diagnosis present

## 2014-10-18 DIAGNOSIS — N184 Chronic kidney disease, stage 4 (severe): Secondary | ICD-10-CM | POA: Diagnosis not present

## 2014-10-18 DIAGNOSIS — E46 Unspecified protein-calorie malnutrition: Secondary | ICD-10-CM | POA: Diagnosis not present

## 2014-10-18 DIAGNOSIS — I5022 Chronic systolic (congestive) heart failure: Secondary | ICD-10-CM | POA: Diagnosis present

## 2014-10-18 DIAGNOSIS — I4891 Unspecified atrial fibrillation: Secondary | ICD-10-CM

## 2014-10-18 DIAGNOSIS — D62 Acute posthemorrhagic anemia: Secondary | ICD-10-CM

## 2014-10-18 DIAGNOSIS — Z86718 Personal history of other venous thrombosis and embolism: Secondary | ICD-10-CM | POA: Diagnosis not present

## 2014-10-18 DIAGNOSIS — Z89612 Acquired absence of left leg above knee: Secondary | ICD-10-CM | POA: Diagnosis not present

## 2014-10-18 LAB — BASIC METABOLIC PANEL
Anion gap: 6 (ref 5–15)
BUN: 21 mg/dL — ABNORMAL HIGH (ref 6–20)
CO2: 26 mmol/L (ref 22–32)
Calcium: 9 mg/dL (ref 8.9–10.3)
Chloride: 107 mmol/L (ref 101–111)
Creatinine, Ser: 1.24 mg/dL (ref 0.61–1.24)
GFR calc Af Amer: 57 mL/min — ABNORMAL LOW (ref >60)
GFR calc non Af Amer: 49 mL/min — ABNORMAL LOW (ref >60)
Glucose, Bld: 111 mg/dL — ABNORMAL HIGH (ref 65–99)
Potassium: 4.2 mmol/L (ref 3.5–5.1)
Sodium: 139 mmol/L (ref 135–145)

## 2014-10-18 LAB — CBC
HCT: 25.3 % — ABNORMAL LOW (ref 39.0–52.0)
Hemoglobin: 8.2 g/dL — ABNORMAL LOW (ref 13.0–17.0)
MCH: 27 pg (ref 26.0–34.0)
MCHC: 32.4 g/dL (ref 30.0–36.0)
MCV: 83.2 fL (ref 78.0–100.0)
Platelets: 333 10*3/uL (ref 150–400)
RBC: 3.04 MIL/uL — ABNORMAL LOW (ref 4.22–5.81)
RDW: 14.9 % (ref 11.5–15.5)
WBC: 9.7 10*3/uL (ref 4.0–10.5)

## 2014-10-18 LAB — PROTIME-INR
INR: 2.33 — ABNORMAL HIGH (ref 0.00–1.49)
Prothrombin Time: 25.3 seconds — ABNORMAL HIGH (ref 11.6–15.2)

## 2014-10-18 LAB — GLUCOSE, CAPILLARY: Glucose-Capillary: 96 mg/dL (ref 65–99)

## 2014-10-18 MED ORDER — SIMVASTATIN 10 MG PO TABS
10.0000 mg | ORAL_TABLET | Freq: Every day | ORAL | Status: DC
  Administered 2014-10-19 – 2014-11-04 (×17): 10 mg via ORAL
  Filled 2014-10-18 (×19): qty 1

## 2014-10-18 MED ORDER — GABAPENTIN 100 MG PO CAPS
200.0000 mg | ORAL_CAPSULE | Freq: Two times a day (BID) | ORAL | Status: DC
  Administered 2014-10-18 – 2014-11-04 (×34): 200 mg via ORAL
  Filled 2014-10-18 (×41): qty 2

## 2014-10-18 MED ORDER — ONDANSETRON HCL 4 MG PO TABS: 4.0000 mg | ORAL_TABLET | Freq: Four times a day (QID) | ORAL | Status: DC | PRN

## 2014-10-18 MED ORDER — WARFARIN SODIUM 2.5 MG PO TABS: ORAL_TABLET | ORAL | Status: DC

## 2014-10-18 MED ORDER — ONDANSETRON HCL 4 MG/2ML IJ SOLN: 4.0000 mg | Freq: Four times a day (QID) | INTRAMUSCULAR | Status: DC | PRN

## 2014-10-18 MED ORDER — BISACODYL 10 MG RE SUPP: 10.0000 mg | Freq: Every day | RECTAL | Status: DC | PRN

## 2014-10-18 MED ORDER — DONEPEZIL HCL 10 MG PO TABS
10.0000 mg | ORAL_TABLET | Freq: Every day | ORAL | Status: DC
  Administered 2014-10-18 – 2014-11-03 (×17): 10 mg via ORAL
  Filled 2014-10-18 (×18): qty 1

## 2014-10-18 MED ORDER — GUAIFENESIN-DM 100-10 MG/5ML PO SYRP: 5.0000 mL | ORAL_SOLUTION | Freq: Four times a day (QID) | ORAL | Status: DC | PRN

## 2014-10-18 MED ORDER — TAMSULOSIN HCL 0.4 MG PO CAPS
0.4000 mg | ORAL_CAPSULE | Freq: Every day | ORAL | Status: DC
  Administered 2014-10-18 – 2014-11-03 (×17): 0.4 mg via ORAL
  Filled 2014-10-18 (×19): qty 1

## 2014-10-18 MED ORDER — OXYCODONE-ACETAMINOPHEN 5-325 MG PO TABS: 1.0000 | ORAL_TABLET | ORAL | Status: DC | PRN

## 2014-10-18 MED ORDER — FERROUS SULFATE 325 (65 FE) MG PO TABS: 325.0000 mg | ORAL_TABLET | Freq: Every day | ORAL | Status: DC

## 2014-10-18 MED ORDER — BISACODYL 10 MG RE SUPP
10.0000 mg | Freq: Every day | RECTAL | Status: DC | PRN
  Administered 2014-10-20: 10 mg via RECTAL
  Filled 2014-10-18: qty 1

## 2014-10-18 MED ORDER — FEBUXOSTAT 40 MG PO TABS
40.0000 mg | ORAL_TABLET | Freq: Every day | ORAL | Status: DC
  Administered 2014-10-19 – 2014-11-04 (×17): 40 mg via ORAL
  Filled 2014-10-18 (×18): qty 1

## 2014-10-18 MED ORDER — OXYCODONE HCL 5 MG PO TABS: 5.0000 mg | ORAL_TABLET | ORAL | Status: DC | PRN

## 2014-10-18 MED ORDER — FOLIC ACID 5 MG/ML IJ SOLN
1.0000 mg | Freq: Every day | INTRAMUSCULAR | Status: DC
  Filled 2014-10-18 (×2): qty 0.2

## 2014-10-18 MED ORDER — POLYSACCHARIDE IRON COMPLEX 150 MG PO CAPS
150.0000 mg | ORAL_CAPSULE | Freq: Every day | ORAL | Status: DC
  Administered 2014-10-18 – 2014-11-04 (×18): 150 mg via ORAL
  Filled 2014-10-18 (×20): qty 1

## 2014-10-18 MED ORDER — GUAIFENESIN-DM 100-10 MG/5ML PO SYRP: 15.0000 mL | ORAL_SOLUTION | ORAL | Status: DC | PRN

## 2014-10-18 MED ORDER — PHENOL 1.4 % MT LIQD: 1.0000 | OROMUCOSAL | Status: DC | PRN

## 2014-10-18 MED ORDER — TRAZODONE HCL 50 MG PO TABS
25.0000 mg | ORAL_TABLET | Freq: Every evening | ORAL | Status: DC | PRN
  Administered 2014-10-18 – 2014-11-03 (×5): 50 mg via ORAL
  Filled 2014-10-18 (×6): qty 1

## 2014-10-18 MED ORDER — THIAMINE HCL 100 MG/ML IJ SOLN
100.0000 mg | Freq: Every day | INTRAMUSCULAR | Status: DC
  Filled 2014-10-18 (×2): qty 1

## 2014-10-18 MED ORDER — WARFARIN - PHARMACIST DOSING INPATIENT
Freq: Every day | Status: DC
  Administered 2014-10-19 – 2014-10-26 (×3)

## 2014-10-18 MED ORDER — SENNOSIDES-DOCUSATE SODIUM 8.6-50 MG PO TABS
1.0000 | ORAL_TABLET | Freq: Every evening | ORAL | Status: DC | PRN
  Administered 2014-10-18 – 2014-10-31 (×2): 1 via ORAL
  Filled 2014-10-18 (×2): qty 1

## 2014-10-18 MED ORDER — WARFARIN SODIUM 2.5 MG PO TABS
2.5000 mg | ORAL_TABLET | Freq: Once | ORAL | Status: AC
  Administered 2014-10-18: 2.5 mg via ORAL
  Filled 2014-10-18: qty 1

## 2014-10-18 MED ORDER — OXYCODONE HCL 5 MG PO TABS
5.0000 mg | ORAL_TABLET | ORAL | Status: DC | PRN
  Administered 2014-10-18: 5 mg via ORAL
  Filled 2014-10-18: qty 1

## 2014-10-18 MED ORDER — METOPROLOL SUCCINATE ER 50 MG PO TB24
50.0000 mg | ORAL_TABLET | Freq: Every day | ORAL | Status: DC
  Administered 2014-10-19 – 2014-11-04 (×17): 50 mg via ORAL
  Filled 2014-10-18 (×18): qty 1

## 2014-10-18 MED ORDER — FLEET ENEMA 7-19 GM/118ML RE ENEM: 1.0000 | ENEMA | Freq: Once | RECTAL | Status: AC | PRN

## 2014-10-18 MED ORDER — DOCUSATE SODIUM 100 MG PO CAPS: 100.0000 mg | ORAL_CAPSULE | Freq: Two times a day (BID) | ORAL | Status: DC

## 2014-10-18 MED ORDER — BISACODYL 10 MG RE SUPP
10.0000 mg | Freq: Every day | RECTAL | Status: DC | PRN
  Filled 2014-10-18: qty 1

## 2014-10-18 MED ORDER — PANTOPRAZOLE SODIUM 40 MG PO TBEC
40.0000 mg | DELAYED_RELEASE_TABLET | Freq: Every day | ORAL | Status: DC
  Administered 2014-10-19 – 2014-11-04 (×17): 40 mg via ORAL
  Filled 2014-10-18 (×17): qty 1

## 2014-10-18 MED ORDER — ALUM & MAG HYDROXIDE-SIMETH 200-200-20 MG/5ML PO SUSP: 30.0000 mL | Freq: Four times a day (QID) | ORAL | Status: DC | PRN

## 2014-10-18 MED ORDER — WARFARIN SODIUM 2.5 MG PO TABS
2.5000 mg | ORAL_TABLET | Freq: Once | ORAL | Status: DC
  Filled 2014-10-18: qty 1

## 2014-10-18 MED ORDER — ISOSORBIDE MONONITRATE 15 MG HALF TABLET
15.0000 mg | ORAL_TABLET | Freq: Every day | ORAL | Status: DC
  Administered 2014-10-19 – 2014-11-04 (×17): 15 mg via ORAL
  Filled 2014-10-18 (×20): qty 1

## 2014-10-18 NOTE — Progress Notes (Signed)
Pt admitted to unit at 1600. Reviewed with pt safety plan and precautions. Wife arrived with her caregiver 54. Reviewed some rehab information with wife.  Bed alarm in place, call bell with in reach, and SRx3. Wife and caregiver of wife at bedside. Pt denies pain at this time.

## 2014-10-18 NOTE — Care Management (Signed)
Important Message  Patient Details  Name: Scott [name] MRN: [id number] Date of Birth: [birth year]   Medicare Important Message Given:  Yes-second notification given    Nathen May 10/18/2014, 10:02 AM

## 2014-10-18 NOTE — Progress Notes (Addendum)
ANTICOAGULATION CONSULT NOTE - Follow Up Consult  Pharmacy Consult for warfarin Indication: History of DVT  Labs:  Recent Labs  10/16/14 0350 10/17/14 0328 10/17/14 0830 10/18/14 0308  HGB 9.2* 8.1*  --  8.2*  HCT 28.4* 25.1*  --  25.3*  PLT 334 339  --  333  APTT 29  --   --   --   LABPROT 21.4* 22.1*  --  25.3*  INR 1.86* 1.94*  --  2.33*  CREATININE 1.26*  --  1.02 1.24    Estimated Creatinine Clearance: 33.6 mL/min (by C-G formula based on Cr of 1.24).   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 Coumadin held for AKA on 7/6 and restarted after sx. Pt refused coumadin on 7/6 but received one dose 7/7. Current INR up to 2.33 after one dose in the last 3 days. Hgb 8.2 Plts wnl. Pt may need lower maintenance dose due to current INR trends.  PTA Coumadin 5mg  daily except 7.5mg  MF  Addendum: patient transferred to rehab this afternoon, will continue previous plan.   Goal of Therapy:  INR 2-3 Monitor platelets by anticoagulation protocol: Yes   Plan:  Give coumadin 2.5mg  PO x 1 Monitor daily INR, CBC, s/s of bleed  Erin Hearing PharmD., BCPS Clinical Pharmacist Pager [phone] 10/18/2014 4:22 PM

## 2014-10-18 NOTE — Care Management Note (Signed)
Case Management Note  Patient Details  Name: Scott [name] MRN: [id number] Date of Birth: Apr 25, 1921  Subjective/Objective:    Pt admitted with ischemic lower left foot            Action/Plan:  Pt had left above the knee amputation 09/16/14.  Pt is from home with 24/7 sitters privately paid.  Pt also has [facility] and PT through [place].  Pt son [name]) is POA.  CM will continue to monitor for disposition needs.    Expected Discharge Date:                  Expected Discharge Plan:     In-House Referral:  CM  Discharge planning Services     Post Acute Care Choice:    Choice offered to:     DME Arranged:    DME Agency:     HH Arranged:   HH Agency:     Status of Service:     Medicare Important Message Given: YesYes-second notification given Date Medicare IM Given:  10/18/2014   Medicare IM give by:  Elenor Quinones Date Additional Medicare IM Given:    Additional Medicare Important Message give by:     If discussed at [place] of Stay Meetings, dates discussed:    Additional Comments: 10/18/14:  Pt will discharge to CIR today  10/17/14 Elenor Quinones, RN, [phone] CM contacted [facility] and informed of pt admit.  CM will request resumption orders if pt discharges home this admit. Maryclare Labrador, RN 10/18/2014, 11:44 AM

## 2014-10-18 NOTE — Progress Notes (Signed)
Orthopedic Tech Progress Note Patient Details:  Scott [name] 03/05/1922 [id number] [employer] contacted to place brace order Patient ID: [name], male   DOB: 12-Mar-1922, 81 y.o.   MRN: [id number]   [name] 10/18/2014, 11:45 AM

## 2014-10-18 NOTE — Progress Notes (Signed)
Report given to [facility] at inpatient rehab.

## 2014-10-18 NOTE — Progress Notes (Signed)
Per CIR admissions coordinator the patient can be admitted to CIR today.  CSW signing off.  Domenica Reamer, [place] Social Worker [phone]

## 2014-10-18 NOTE — Progress Notes (Addendum)
Progress Note    10/18/2014 7:17 AM 2 Days Post-Op  Subjective:  Pt sleeping-did not wake him up.  RN states that he had a good day yesterday and was able to walk a little bit with the walker and hop and sat in the chair all day.  Pt does not like anything covering his stump.    Tm 99.2 VSS 94% RA   Filed Vitals:   10/18/14 0519  BP: 123/53  Pulse: 91  Temp: 99.2 F (37.3 C)  Resp: 20    Physical Exam: Incisions:  C/d/i with staples in tact   CBC    Component Value Date/Time   WBC 9.7 10/18/2014 0308   WBC 4.6 07/30/2014 1402   WBC 4.7 03/26/2014 1446   WBC 3.5* 01/13/2011 1349   RBC 3.04* 10/18/2014 0308   RBC 4.20* 07/30/2014 1402   RBC 4.14 03/26/2014 1446   RBC 3.57* 01/13/2011 1349   HGB 8.2* 10/18/2014 0308   HGB 10.7* 07/30/2014 1402   HGB 11.5* 01/13/2011 1349   HCT 25.3* 10/18/2014 0308   HCT 36.0* 07/30/2014 1402   HCT 33.7* 01/13/2011 1349   PLT 333 10/18/2014 0308   PLT 207 01/13/2011 1349   MCV 83.2 10/18/2014 0308   MCV 85.7 07/30/2014 1402   MCV 94.3 01/13/2011 1349   MCH 27.0 10/18/2014 0308   MCH 25.5* 07/30/2014 1402   MCH 27.8 03/26/2014 1446   MCH 32.2 01/13/2011 1349   MCHC 32.4 10/18/2014 0308   MCHC 29.8* 07/30/2014 1402   MCHC 32.6 03/26/2014 1446   MCHC 34.1 01/13/2011 1349   RDW 14.9 10/18/2014 0308   RDW 15.6* 03/26/2014 1446   RDW 17.4* 01/13/2011 1349   LYMPHSABS 0.7 10/03/2014 1129   LYMPHSABS 1.3 03/26/2014 1446   LYMPHSABS 0.5* 01/13/2011 1349   MONOABS 0.7 10/03/2014 1129   MONOABS 0.4 01/13/2011 1349   EOSABS 0.2 10/03/2014 1129   EOSABS 0.1 03/26/2014 1446   BASOSABS 0.0 10/03/2014 1129   BASOSABS 0.0 03/26/2014 1446   BASOSABS 0.0 01/13/2011 1349    BMET    Component Value Date/Time   NA 139 10/18/2014 0308   NA 139 08/26/2014 1620   K 4.2 10/18/2014 0308   CL 107 10/18/2014 0308   CO2 26 10/18/2014 0308   GLUCOSE 111* 10/18/2014 0308   GLUCOSE 112* 08/26/2014 1620   BUN 21* 10/18/2014 0308   BUN  45* 08/26/2014 1620   CREATININE 1.24 10/18/2014 0308   CREATININE 0.91 11/02/2012 1310   CALCIUM 9.0 10/18/2014 0308   GFRNONAA 49* 10/18/2014 0308   GFRNONAA 74 11/02/2012 1310   GFRAA 57* 10/18/2014 0308   GFRAA 86 11/02/2012 1310    INR    Component Value Date/Time   INR 2.33* 10/18/2014 0308   INR 4.3 09/04/2014 1421     Intake/Output Summary (Last 24 hours) at 10/18/14 0717 Last data filed at 10/17/14 1806  Gross per 24 hour  Intake    480 ml  Output    300 ml  Net    180 ml     Assessment/Plan:  81 y.o. male is s/p left above knee amputation  2 Days Post-Op  -CIR evaluated him yesterday.  Hopefully with pt motivation and able to take a few steps with the walker, he will be a candidate for CIR.  Will await their recommendations. -will order a retention sock for left stump -RN states the pt gets agitated when he has pain and has not had his pain medication. -  coumadin per pharmacy-INR 2.33 (from 1.94) after one dose of coumadin 5mg     Leontine Locket, PA-C [facility] [phone] 10/18/2014 7:17 AM  Agree with above Stump healing nicely  Hopefully will be candidate for CIR to regain some strength prior to returning home

## 2014-10-18 NOTE — H&P (Signed)
Physical Medicine and Rehabilitation Admission H&P   CC: L-BKA   HPI: Scott [name] is a 81 y.o. male with history of HTN, BLE DVT/ PE, A fib- chronic coumadin, NICM, severe PVD with gangrenous changes left foot with recommendations for amputation due to non-salvageable limb. Patient elected to undergo L-AKA on 10/16/14 by Dr. Kellie Simmering. Post op with agitation due to delirium as well as ABLA.IV antibiotics d/c and coumadin resumed. Delirium has resolved and he was able to participate in PT/OT evaluations. Retention socks ordered by [employer]. CIR recommended by MD and Rehab team and patient admitted today.     Review of Systems  HENT: Negative for hearing loss.  Eyes: Positive for blurred vision (needs glasses).  Respiratory: Negative for cough and shortness of breath.  Cardiovascular: Negative for chest pain and palpitations.  Gastrointestinal: Positive for constipation. Negative for heartburn and abdominal pain.  Genitourinary: Negative for dysuria and urgency.  Musculoskeletal: Positive for back pain. Negative for joint pain and neck pain.  Neurological: Negative for dizziness, tingling and headaches.  Psychiatric/Behavioral: The patient is not nervous/anxious.      Past Medical History  Diagnosis Date  . HTN (hypertension)   . Hyperlipidemia   . PVCs (premature ventricular contractions)   . Glaucoma   . PVD (peripheral vascular disease)     Right ABI .75, Left .78 (2006)  . Popliteal artery aneurysm, bilateral DECUMENTED CHRONIC PARTIAL OCCLUSION-- PT DENIES CLAUDICATION OR ANY OTHER SYMPTOMS  . Frequency of urination   . Nocturia   . DVT, bilateral lower limbs 03/2013    a. Bilat DVT dx 03/2013.  . Pulmonary embolism     a. By CT angio 04/2013.  Marland Kitchen Elevated troponin     a. Adm 12/30-04/2013 - not felt to represent ACS; ?due to PE. b. Patent cors 06/2012.  Marland Kitchen NSVT (nonsustained ventricular tachycardia)     a.  NSVT 06/2012; NSVT also seen during 03/2013 adm. b. Med rx. Not candidate for ICD given adv age.  Marland Kitchen ETOH abuse   . Syncope     a. Felt to be postural syncope related to diuretics 06/2012.  Marland Kitchen Heart murmur   . DVT (deep venous thrombosis)     "I think in both legs"  . Pneumonia     "couple times"  . DDD (degenerative disc disease)   . Arthritis     "all over"  . Depression   . CAD- non obstructive disease by cath 3/14   . PAD (peripheral artery disease)   . Gangrene of toe 10/03/2014  . Sepsis due to other etiology 10/03/2014  . Chronic systolic heart failure 1/76/16    EF improved 50-55%  . Chronic systolic CHF (congestive heart failure)     a. NICM - patent cors 06/2012, EF 40% at that time. b. 03/2013 eval: EF 20-25%.  . Chronic lower back pain   . Bladder cancer 03/15/2012    pt denies this hx on 10/15/2014    Past Surgical History  Procedure Laterality Date  . Transthoracic echocardiogram  09-22-2010    MODERATE LVH/ EF 07%/ GRADE I DIASTOLIC DYSFUNCTION/ AORTIC SCLEROSIS WITHOUT STENOSIS/ RV SYSTOLIC MILDLY REDUCED FUNCTION  . Cardiovascular stress test  10-15-2010    LOW RISK NUCLEAR STUDY/ NO EVIDENCE OF ISCHEMIA/ NORMAL EF  . Shoulder surgery  1970's    "don't remember which side or what kind of OR"  . Transurethral resection of bladder tumor  10/07/2011    Procedure: TRANSURETHRAL RESECTION OF BLADDER TUMOR (  TURBT); Surgeon: Malka So, MD; Location: [facility]; Service: Urology; Laterality: N/A;  . Cystoscopy  10/07/2011    Procedure: CYSTOSCOPY; Surgeon: Malka So, MD; Location: [facility]; Service: Urology; Laterality: N/A;  . Cystoscopy with biopsy  03/14/2012    Procedure: CYSTOSCOPY WITH BIOPSY; Surgeon: Malka So, MD; Location: [facility]; Service: Urology; Laterality: N/A; WITH FULGURATION  .  Transurethral resection of prostate  03/14/2012    Procedure: TRANSURETHRAL RESECTION OF THE PROSTATE WITH GYRUS INSTRUMENTS; Surgeon: Malka So, MD; Location: [facility]; Service: Urology; Laterality: N/A;  . Tonsillectomy    . Cataract extraction w/ intraocular lens implant, bilateral Bilateral   . Cardiac catheterization  05-11-2004 DR Hominy PLAQUE/ NORMAL LVF/ EF 55%/ NON-OBSTRUCTIVE LAD 25%  . Cardiac catheterization  06/2012  . Eye surgery    . Amputation Left 10/16/2014    Procedure: AMPUTATION ABOVE KNEE- LEFT; Surgeon: Mal Misty, MD; Location: [facility]; Service: Vascular; Laterality: Left;     Family History  Problem Relation Age of Onset  . Hypertension Mother   . Arthritis Mother   . Lung cancer Sister   . Deep vein thrombosis Father   . Early death Brother      Social History: Married. Independent and worked as an Orthoptist. He reports that he quit smoking about 39 years ago. His smoking use included Cigarettes. He started smoking about 72 years ago. He has a 20 pack-year smoking history. He has never used smokeless tobacco. He drinks alcohol on rare occasions. He reports that he does not use illicit drugs.     Allergies  Allergen Reactions  . Lipitor [Atorvastatin] Other (See Comments)    unknown    Medications Prior to Admission  Medication Sig Dispense Refill  . amoxicillin (AMOXIL) 500 MG capsule Take 1 capsule (500 mg total) by mouth every 12 (twelve) hours. Antibiotic to be taken for 7 more days 14 capsule 0  . donepezil (ARICEPT) 10 MG tablet TAKE ONE TABLET AT BEDTIME 30 tablet 2  . enoxaparin (LOVENOX) 80 MG/0.8ML injection Inject 0.8 mLs (80 mg total) into the skin every 12 (twelve) hours. 7 Syringe 0  . fluticasone (FLONASE) 50 MCG/ACT nasal spray Place 2 sprays into both nostrils daily. 16 g 6  . furosemide (LASIX)  40 MG tablet 40 MG ON MON, WED, AND FRI'S; THEN 20 MG ALL OTHER DAYS 30 tablet 5  . gabapentin (NEURONTIN) 100 MG capsule TAKE (2) CAPSULES TWICE DAILY. 120 capsule 0  . HORSE CHESTNUT PO Take 1 capsule by mouth daily.    Marland Kitchen HYDROcodone-acetaminophen (NORCO/VICODIN) 5-325 MG per tablet Take 1/2 to a whole tab QID PRN for severe pain 60 tablet 0  . isosorbide mononitrate (IMDUR) 30 MG 24 hr tablet Take 0.5 tablets (15 mg total) by mouth daily. 30 tablet 5  . lidocaine (XYLOCAINE) 2 % jelly APPLY AS NEEDED TO THE LEFT FOOD ULCER  2  . megestrol (MEGACE) 20 MG tablet TAKE 1 TABLET ONCE A DAY 30 tablet 3  . metoprolol succinate (TOPROL-XL) 50 MG 24 hr tablet TAKE (1) TABLET TWICE A DAY. 60 tablet 0  . nitroGLYCERIN (NITROSTAT) 0.4 MG SL tablet Place 1 tablet (0.4 mg total) under the tongue every 5 (five) minutes as needed for chest pain (up to 3 doses). 25 tablet 3  . potassium chloride SA (K-DUR,KLOR-CON) 20 MEQ tablet Take 1 tablet (20 mEq total) by mouth daily. 90 tablet 1  .  simvastatin (ZOCOR) 10 MG tablet TAKE 1 TABLET DAILY 30 tablet 4  . tamsulosin (FLOMAX) 0.4 MG CAPS capsule TAKE (1) CAPSULE DAILY 30 capsule 5  . ULORIC 40 MG tablet TAKE 1 TABLET DAILY 30 tablet 4  . [DISCONTINUED] warfarin (COUMADIN) 2.5 MG tablet Take two tablets each evening until further notified by the physician who is monitoring your blood work.      Home: Home Living Family/patient expects to be discharged to:: Private residence Living Arrangements: Spouse/significant other, Other (Comment) (24 hour caretakers per chart review) Available Help at Discharge: Available 24 hours/day, Personal care attendant Type of Home: House Home Access: Ramped entrance Home Layout: One [place]: Environmental consultant - 2 wheels, Cane - single point, Bedside commode, Tub bench, Other (comment) Additional Comments: Hoyer lift as pt's wife has  paraplegia  Functional History: Prior Function Level of Independence: Needs assistance Gait / Transfers Assistance Needed: Pt states that he was using a cane to transfer to the wheelchair and occasionally needed assistance ADL's / Homemaking Assistance Needed: Pt was performing all bathing, dressing, and toileting with supervision up until approximately 3 weeks ago.   Functional Status:  Mobility: Bed Mobility Overal bed mobility: Needs Assistance Bed Mobility: Supine to Sit Supine to sit: Mod assist, +2 for physical assistance General bed mobility comments: Hand-over-hand assist to reach for the bed rails. Pt was able to help pull himself over to the EOB, and therapist/tech assisted with bed pad.  Transfers Overall transfer level: Needs assistance Equipment used: Rolling walker (2 wheeled), 2 person hand held assist Transfers: Sit to/from Stand, Squat Pivot Transfers Sit to Stand: Max assist Stand pivot transfers: Max assist Squat pivot transfers: Max assist, +2 physical assistance General transfer comment: Pt was able to maintain static standing with mod assist and take 3 steps for simulated transfer with the RW with max assist.       ADL: ADL Eating/Feeding: Independent Grooming: Wash/dry hands, Wash/dry face, Sitting, Supervision/safety Grooming Details (indicate cue type and reason): simulated Upper Body Bathing: Supervision/ safety, Sitting Lower Body Bathing: +2 for physical assistance, Moderate assistance, Sit to/from stand Lower Body Dressing: +2 for physical assistance, Moderate assistance, Sit to/from stand Toilet Transfer: Stand-pivot, RW, Maximal assistance Toileting- Clothing Manipulation and Hygiene: +2 for physical assistance, Moderate assistance Functional mobility during ADLs: Maximal assistance General ADL Comments: Pt needed max assist for sit to stand from the bedside chair with max demonstrational cueing for hand placement with sit to stand. Pt's son  talked with therapist via phone and really wants pt to go to inpatient rehab and not SNF. He reassured therapist that pt has physical 24 hour care at discharge. Feel he is appropriate to make progess as he was actually able to take 3 step forward with the walker and mod-max assist   Cognition: Cognition Overall Cognitive Status: Impaired/Different from baseline Orientation Level: Oriented to person, Oriented to situation Cognition Arousal/Alertness: Awake/alert Behavior During Therapy: WFL for tasks assessed/performed Overall Cognitive Status: Impaired/Different from baseline Area of Impairment: Orientation, Memory, Following commands, Problem solving Following Commands: Follows one step commands inconsistently Awareness: Emergent Problem Solving: Slow processing, Difficulty sequencing, Requires verbal cues, Requires tactile cues General Comments: Pt appropriate during session and talking to his son on the phone as therapist entered the room. Able to recall only 1/3 words after approximately 2 min delay.     Blood pressure 123/53, pulse 91, temperature 99.2 F (37.3 C), temperature source Oral, resp. rate 20, height 5' 10"  (1.778 m), weight 61.2 kg (  134 lb 14.7 oz), SpO2 94 %. Physical Exam  Nursing note and vitals reviewed. Constitutional: He is oriented to person, place, and time. He appears well-developed and well-nourished.  HENT: dentition poor. Mucosa fairly moist Head: Normocephalic and atraumatic.  Eyes: Conjunctivae are normal. Pupils are equal, round, and reactive to light.  Neck: Normal range of motion. Neck supple.  Cardiovascular: Normal rate and regular rhythm.no murmur Respiratory: Effort normal and breath sounds normal. No respiratory distress. He has no wheezes.  GI: Soft. Bowel sounds are normal. He exhibits no distension. There is no tenderness.  Musculoskeletal: He exhibits edema.  RLE with 1+edema  Neurological: He is alert and oriented to person, place, and  time. UE 4/5 prox to distal. RLE: 3/5 hf, ke and 4/5 adf/apf, LLE: 3/5 HF,   Skin: Skin is warm and dry. Left AK left open to air. Incision is intact/wound dry.    Lab Results Last 48 Hours    Results for orders placed or performed during the hospital encounter of 10/15/14 (from the past 48 hour(s))  Glucose, capillary Status: Abnormal   Collection Time: 10/16/14 10:02 PM  Result Value Ref Range   Glucose-Capillary 109 (H) 65 - 99 mg/dL   Comment 1 Notify RN    Comment 2 Document in Chart   CBC Status: Abnormal   Collection Time: 10/17/14 3:28 AM  Result Value Ref Range   WBC 11.9 (H) 4.0 - 10.5 K/uL   RBC 3.01 (L) 4.22 - 5.81 MIL/uL   Hemoglobin 8.1 (L) 13.0 - 17.0 g/dL   HCT 25.1 (L) 39.0 - 52.0 %   MCV 83.4 78.0 - 100.0 fL   MCH 26.9 26.0 - 34.0 pg   MCHC 32.3 30.0 - 36.0 g/dL   RDW 15.0 11.5 - 15.5 %   Platelets 339 150 - 400 K/uL  Protime-INR Status: Abnormal   Collection Time: 10/17/14 3:28 AM  Result Value Ref Range   Prothrombin Time 22.1 (H) 11.6 - 15.2 seconds   INR 1.94 (H) 0.00 - 1.49  Glucose, capillary Status: None   Collection Time: 10/17/14 6:37 AM  Result Value Ref Range   Glucose-Capillary 80 65 - 99 mg/dL  Basic metabolic panel Status: Abnormal   Collection Time: 10/17/14 8:30 AM  Result Value Ref Range   Sodium 143 135 - 145 mmol/L   Potassium 4.3 3.5 - 5.1 mmol/L   Chloride 110 101 - 111 mmol/L   CO2 25 22 - 32 mmol/L   Glucose, Bld 103 (H) 65 - 99 mg/dL   BUN 17 6 - 20 mg/dL   Creatinine, Ser 1.02 0.61 - 1.24 mg/dL   Calcium 9.1 8.9 - 10.3 mg/dL   GFR calc non Af Amer >60 >60 mL/min   GFR calc Af Amer >60 >60 mL/min    Comment: (NOTE) The eGFR has been calculated using the CKD EPI equation. This calculation has not been validated in all clinical situations. eGFR's persistently <60 mL/min  signify possible Chronic Kidney Disease.    Anion gap 8 5 - 15  Glucose, capillary Status: Abnormal   Collection Time: 10/17/14 11:22 AM  Result Value Ref Range   Glucose-Capillary 125 (H) 65 - 99 mg/dL  Glucose, capillary Status: Abnormal   Collection Time: 10/17/14 4:31 PM  Result Value Ref Range   Glucose-Capillary 107 (H) 65 - 99 mg/dL  Glucose, capillary Status: Abnormal   Collection Time: 10/17/14 9:28 PM  Result Value Ref Range   Glucose-Capillary 102 (H) 65 - 99 mg/dL  Comment 1 Notify RN    Comment 2 Document in Chart   Protime-INR Status: Abnormal   Collection Time: 10/18/14 3:08 AM  Result Value Ref Range   Prothrombin Time 25.3 (H) 11.6 - 15.2 seconds   INR 2.33 (H) 0.00 - 1.49  CBC Status: Abnormal   Collection Time: 10/18/14 3:08 AM  Result Value Ref Range   WBC 9.7 4.0 - 10.5 K/uL   RBC 3.04 (L) 4.22 - 5.81 MIL/uL   Hemoglobin 8.2 (L) 13.0 - 17.0 g/dL   HCT 25.3 (L) 39.0 - 52.0 %   MCV 83.2 78.0 - 100.0 fL   MCH 27.0 26.0 - 34.0 pg   MCHC 32.4 30.0 - 36.0 g/dL   RDW 14.9 11.5 - 15.5 %   Platelets 333 150 - 400 K/uL  Basic metabolic panel Status: Abnormal   Collection Time: 10/18/14 3:08 AM  Result Value Ref Range   Sodium 139 135 - 145 mmol/L   Potassium 4.2 3.5 - 5.1 mmol/L   Chloride 107 101 - 111 mmol/L   CO2 26 22 - 32 mmol/L   Glucose, Bld 111 (H) 65 - 99 mg/dL   BUN 21 (H) 6 - 20 mg/dL   Creatinine, Ser 1.24 0.61 - 1.24 mg/dL   Calcium 9.0 8.9 - 10.3 mg/dL   GFR calc non Af Amer 49 (L) >60 mL/min   GFR calc Af Amer 57 (L) >60 mL/min    Comment: (NOTE) The eGFR has been calculated using the CKD EPI equation. This calculation has not been validated in all clinical situations. eGFR's persistently <60 mL/min signify possible Chronic Kidney Disease.    Anion gap 6 5 -  15      Imaging Results (Last 48 hours)    No results found.       Medical Problem List and Plan: 1. Functional deficits secondary to L-AKA due to severe PVD with gangrenous changes.  2. H/o PE/DVT/Anticoagulation: Pharmaceutical: Coumadin 3. Pain Management: Will continue oxycodone prn 4. Mood: LCSW to follow for evaluation and support.  5. Neuropsych: This patient is not capable of making decisions on his own behalf. 6. Skin/Wound Care: Monitor wound daily for healing. Routine pressure relief measures.  7. Fluids/Electrolytes/Nutrition: Monitor I/O. Check lytes in am. 8. ABLA: Will add iron supplement  9. Non-obstructive CAD: On BB and statin.  10. Dementia: Continue Aricept. Delirium resolved. Somewhat sedated from pain medication given today---need to be conservative with medications 11. CKD stage IV: Improved with hydration. Monitor for now.  12. Combined systolic and diastolic CHF: Monitor daily weights for signs of overload. On BB, nitrates and statin. Lasix Currently on hold.  13. BPH- On Flomax.      Post Admission Physician Evaluation: 1. Functional deficits secondary to L-AKA due to severe PVD with gangrenous changes.  2. Patient is admitted to receive collaborative, interdisciplinary care between the physiatrist, rehab nursing staff, and therapy team. 3. Patient's level of medical complexity and substantial therapy needs in context of that medical necessity cannot be provided at a lesser intensity of care such as a SNF. 4. Patient has experienced substantial functional loss from his/her baseline which was documented above under the "Functional History" and "Functional Status" headings. Judging by the patient's diagnosis, physical exam, and functional history, the patient has potential for functional progress which will result in measurable gains while on inpatient rehab. These gains will be of substantial and practical use upon discharge in  facilitating mobility and self-care at the household level. 5. Physiatrist  will provide 24 hour management of medical needs as well as oversight of the therapy plan/treatment and provide guidance as appropriate regarding the interaction of the two. 6. 24 hour rehab nursing will assist with bladder management, bowel management, safety, skin/wound care, disease management, medication administration, pain management and patient education and help integrate therapy concepts, techniques,education, etc. 7. PT will assess and treat for/with: Lower extremity strength, range of motion, stamina, balance, functional mobility, safety, adaptive techniques and equipment, pain mgt, pre-prosthetic ed. Wound mgt, family ed. Goals are: supervision to min assist. 8. OT will assess and treat for/with: ADL's, functional mobility, safety, upper extremity strength, adaptive techniques and equipment, pain mgt, famiy ed, coping skills, wound care. Goals are: supervision to min assist. Therapy may proceed with showering this patient if left leg is coverage. 9. SLP will assess and treat for/with: n/a. Goals are: n/a. 10. Case Management and Social Worker will assess and treat for psychological issues and discharge planning. 11. Team conference will be held weekly to assess progress toward goals and to determine barriers to discharge. 12. Patient will receive at least 3 hours of therapy per day at least 5 days per week. 13. ELOS: 12-15 days  14. Prognosis: excellent     Meredith Staggers, MD, [facility] 10/18/2014

## 2014-10-18 NOTE — Discharge Instructions (Signed)
Follow with Primary MD Redge Gainer, MD after dischrge from CIR.  Get CBC, CMP, 2 view Chest X ray checked  by Primary MD next visit.    Activity: As tolerated with Full fall precautions use walker/cane & assistance as needed   Disposition CIR   Diet: Heart Healthy  , with feeding assistance and aspiration precautions.  For Heart failure patients - Check your Weight same time everyday, if you gain over 2 pounds, or you develop in leg swelling, experience more shortness of breath or chest pain, call your Primary MD immediately. Follow Cardiac Low Salt Diet and 1.5 lit/day fluid restriction.   On your next visit with your primary care physician please Get Medicines reviewed and adjusted.   Please request your Prim.MD to go over all Hospital Tests and Procedure/Radiological results at the follow up, please get all Hospital records sent to your Prim MD by signing hospital release before you go home.   If you experience worsening of your admission symptoms, develop shortness of breath, life threatening emergency, suicidal or homicidal thoughts you must seek medical attention immediately by calling 911 or calling your MD immediately  if symptoms less severe.  You Must read complete instructions/literature along with all the possible adverse reactions/side effects for all the Medicines you take and that have been prescribed to you. Take any new Medicines after you have completely understood and accpet all the possible adverse reactions/side effects.   Do not drive, operating heavy machinery, perform activities at heights, swimming or participation in water activities or provide baby sitting services if your were admitted for syncope or siezures until you have seen by Primary MD or a Neurologist and advised to do so again.  Do not drive when taking Pain medications.    Do not take more than prescribed Pain, Sleep and Anxiety Medications  Special Instructions: If you have smoked or chewed  Tobacco  in the last 2 yrs please stop smoking, stop any regular Alcohol  and or any Recreational drug use.  Wear Seat belts while driving.   Please note  You were cared for by a hospitalist during your hospital stay. If you have any questions about your discharge medications or the care you received while you were in the hospital after you are discharged, you can call the unit and asked to speak with the hospitalist on call if the hospitalist that took care of you is not available. Once you are discharged, your primary care physician will handle any further medical issues. Please note that NO REFILLS for any discharge medications will be authorized once you are discharged, as it is imperative that you return to your primary care physician (or establish a relationship with a primary care physician if you do not have one) for your aftercare needs so that they can reassess your need for medications and monitor your lab values.  Information on my medicine - Coumadin   (Warfarin)  This medication education was reviewed with me or my healthcare representative as part of my discharge preparation.  The pharmacist that spoke with me during my hospital stay was:  Reginia Naas, [facility]  Why was Coumadin prescribed for you? Coumadin was prescribed for you because you have a blood clot or a medical condition that can cause an increased risk of forming blood clots. Blood clots can cause serious health problems by blocking the flow of blood to the heart, lung, or brain. Coumadin can prevent harmful blood clots from forming.  What test will check on  my response to Coumadin? While on Coumadin (warfarin) you will need to have an INR test regularly to ensure that your dose is keeping you in the desired range. The INR (international normalized ratio) number is calculated from the result of the laboratory test called prothrombin time (PT).  If an INR APPOINTMENT HAS NOT ALREADY BEEN MADE FOR YOU please schedule  an appointment to have this lab work done by your health care provider within 7 days. Your INR goal is usually a number between:  2 to 3 or your provider may give you a more narrow range like 2-2.5.  Ask your health care provider during an office visit what your goal INR is.  What  do you need to  know  About  COUMADIN? Take Coumadin (warfarin) exactly as prescribed by your healthcare provider about the same time each day.  DO NOT stop taking without talking to the doctor who prescribed the medication.  Stopping without other blood clot prevention medication to take the place of Coumadin may increase your risk of developing a new clot or stroke.  Get refills before you run out.  What do you do if you miss a dose? If you miss a dose, take it as soon as you remember on the same day then continue your regularly scheduled regimen the next day.  Do not take two doses of Coumadin at the same time.  Important Safety Information A possible side effect of Coumadin (Warfarin) is an increased risk of bleeding. You should call your healthcare provider right away if you experience any of the following: ? Bleeding from an injury or your nose that does not stop. ? Unusual colored urine (red or dark brown) or unusual colored stools (red or black). ? Unusual bruising for unknown reasons. ? A serious fall or if you hit your head (even if there is no bleeding).  Some foods or medicines interact with Coumadin (warfarin) and might alter your response to warfarin. To help avoid this: ? Eat a balanced diet, maintaining a consistent amount of Vitamin K. ? Notify your provider about major diet changes you plan to make. ? Avoid alcohol or limit your intake to 1 drink for women and 2 drinks for men per day. (1 drink is 5 oz. wine, 12 oz. beer, or 1.5 oz. liquor.)  Make sure that ANY health care provider who prescribes medication for you knows that you are taking Coumadin (warfarin).  Also make sure the healthcare  provider who is monitoring your Coumadin knows when you have started a new medication including herbals and non-prescription products.  Coumadin (Warfarin)  Major Drug Interactions  Increased Warfarin Effect Decreased Warfarin Effect  Alcohol (large quantities) Antibiotics (esp. Septra/Bactrim, Flagyl, Cipro) Amiodarone (Cordarone) Aspirin (ASA) Cimetidine (Tagamet) Megestrol (Megace) NSAIDs (ibuprofen, naproxen, etc.) Piroxicam (Feldene) Propafenone (Rythmol SR) Propranolol (Inderal) Isoniazid (INH) Posaconazole (Noxafil) Barbiturates (Phenobarbital) Carbamazepine (Tegretol) Chlordiazepoxide (Librium) Cholestyramine (Questran) Griseofulvin Oral Contraceptives Rifampin Sucralfate (Carafate) Vitamin K   Coumadin (Warfarin) Major Herbal Interactions  Increased Warfarin Effect Decreased Warfarin Effect  Garlic Ginseng Ginkgo biloba Coenzyme Q10 Green tea St. Johns wort    Coumadin (Warfarin) FOOD Interactions  Eat a consistent number of servings per week of foods HIGH in Vitamin K (1 serving =  cup)  Collards (cooked, or boiled & drained) Kale (cooked, or boiled & drained) Mustard greens (cooked, or boiled & drained) Parsley *serving size only =  cup Spinach (cooked, or boiled & drained) Swiss chard (cooked, or boiled & drained) Turnip greens (  cooked, or boiled & drained)  Eat a consistent number of servings per week of foods MEDIUM-HIGH in Vitamin K (1 serving = 1 cup)  Asparagus (cooked, or boiled & drained) Broccoli (cooked, boiled & drained, or raw & chopped) Brussel sprouts (cooked, or boiled & drained) *serving size only =  cup Lettuce, raw (green leaf, endive, romaine) Spinach, raw Turnip greens, raw & chopped   These websites have more information on Coumadin (warfarin):  FailFactory.se; VeganReport.com.au;

## 2014-10-18 NOTE — Progress Notes (Signed)
Rehab admissions - I am following pt's case for my partner, Genie and we have received medical clearance from Dr. [facility]. Rehab bed is available today and we will admit pt to CIR later today.   I met with pt to share this news and he was sleepy throughout my visit. Pt had safety sitter and she reported that pt fell back asleep after eating breakfast. I updated pt's RN about the plan for CIR and Rn shared that sitter will be discontinued later this am as pt has had improved cognition overall. (Rn reports that pt had some initial confusion the night of his surgery).  I updated Aldona Bar, case Freight forwarder and Eliezer Lofts, Education officer, museum. I called and left message for son.   Please call me with any questions. Thanks.  Nanetta Batty, PT Rehabilitation Admissions Coordinator [phone]

## 2014-10-18 NOTE — Progress Notes (Signed)
ANTICOAGULATION CONSULT NOTE - Follow Up Consult  Pharmacy Consult for warfarin Indication: History of DVT  Allergies  Allergen Reactions  . Lipitor [Atorvastatin] Other (See Comments)    unknown    Patient Measurements: Height: 5\' 10"  (177.8 cm) Weight: 134 lb 14.7 oz (61.2 kg) IBW/kg (Calculated) : 73   Vital Signs: Temp: 99.2 F (37.3 C) (07/08 0519) Temp Source: Oral (07/08 0519) BP: 123/53 mmHg (07/08 0519) Pulse Rate: 91 (07/08 0519)  Labs:  Recent Labs  10/16/14 0350 10/17/14 0328 10/17/14 0830 10/18/14 0308  HGB 9.2* 8.1*  --  8.2*  HCT 28.4* 25.1*  --  25.3*  PLT 334 339  --  333  APTT 29  --   --   --   LABPROT 21.4* 22.1*  --  25.3*  INR 1.86* 1.94*  --  2.33*  CREATININE 1.26*  --  1.02 1.24    Estimated Creatinine Clearance: 33.6 mL/min (by C-G formula based on Cr of 1.24).   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 Coumadin held for AKA on 7/6 and restarted after sx. Pt refused coumadin on 7/6 but received one dose 7/7. Current INR up to 2.33 after one dose in the last 3 days. Hgb 8.2 Plts wnl. Pt may need lower maintenance dose due to current INR trends.  PTA Coumadin 5mg  daily except 7.5mg  MF   Goal of Therapy:  INR 2-3 Monitor platelets by anticoagulation protocol: Yes   Plan:  Give coumadin 2.5mg  PO x 1 Monitor daily INR, CBC, s/s of bleed   Assunta Found Talulah Schirmer 10/18/2014,11:08 AM

## 2014-10-18 NOTE — Discharge Instructions (Signed)
Inpatient Rehab Discharge Instructions  [place] Discharge date and time:    Activities/Precautions/ Functional Status: Activity: activity as tolerated Diet: cardiac diet Wound Care: keep wound clean and dry Functional status:  ___ No restrictions     ___ Walk up steps independently ___ 24/7 supervision/assistance   ___ Walk up steps with assistance ___ Intermittent supervision/assistance  ___ Bathe/dress independently ___ Walk with walker     ___ Bathe/dress with assistance ___ Walk Independently    ___ Shower independently ___ Walk with assistance    ___ Shower with assistance ___ No alcohol     ___ Return to work/school ________  Special Instructions:    My questions have been answered and I understand these instructions. I will adhere to these goals and the provided educational materials after my discharge from the hospital.  Patient/Caregiver Signature _______________________________ Date __________  Clinician Signature _______________________________________ Date __________  Please bring this form and your medication list with you to all your follow-up doctor's appointments.

## 2014-10-18 NOTE — Discharge Summary (Signed)
Scott [name], is a 81 y.o. male  DOB Nov 11, 1921  MRN [id number].  Admission date:  10/15/2014  Admitting Physician  Theodis Blaze, MD  Discharge Date:  10/18/2014   Primary MD  Redge Gainer, MD  Recommendations for primary care physician for things to follow:  - Please consult: Inpatient pharmacy to continue dosing and monitoring of patient warfarin. - Follow-up with vascular surgery as scheduled as an outpatient. - Check CBC, BMP in 3 days.   CODE STATUS: DO NOT RESUSCITATE  Admission Diagnosis  Peripheral Vascular Disease with gangrene left foot I70.262  Ischemic Cardiomyopathy   Discharge Diagnosis  Peripheral Vascular Disease with gangrene left foot I70.262  Ischemic Cardiomyopathy    Principal Problem:   Ischemic ulcer of left foot Active Problems:   HTN (hypertension)   Hyperlipidemia   CAD- non obstructive disease by cath 3/14   Chronic diastolic heart failure, NYHA class 1   DVT of lower extremity, bilateral   History of pulmonary embolism (2014)   BPH (benign prostatic hyperplasia)   Atherosclerotic PVD with ulceration      Past Medical History  Diagnosis Date  . HTN (hypertension)   . Hyperlipidemia   . PVCs (premature ventricular contractions)   . Glaucoma   . PVD (peripheral vascular disease)     Right ABI .75, Left .78 (2006)  . Popliteal artery aneurysm, bilateral DECUMENTED CHRONIC PARTIAL OCCLUSION--  PT DENIES CLAUDICATION OR ANY OTHER SYMPTOMS  . Frequency of urination   . Nocturia   . DVT, bilateral lower limbs 03/2013    a. Bilat DVT dx 03/2013.  . Pulmonary embolism     a. By CT angio 04/2013.  Marland Kitchen Elevated troponin     a. Adm 12/30-04/2013 - not felt to represent ACS; ?due to PE. b. Patent cors 06/2012.  Marland Kitchen NSVT (nonsustained ventricular tachycardia)     a. NSVT 06/2012; NSVT also seen during 03/2013 adm. b. Med rx. Not candidate for ICD given adv age.  Marland Kitchen ETOH abuse     . Syncope     a. Felt to be postural syncope related to diuretics 06/2012.  Marland Kitchen Heart murmur   . DVT (deep venous thrombosis)     "I think in both legs"  . Pneumonia     "couple times"  . DDD (degenerative disc disease)   . Arthritis     "all over"  . Depression   . CAD- non obstructive disease by cath 3/14   . PAD (peripheral artery disease)   . Gangrene of toe 10/03/2014  . Sepsis due to other etiology 10/03/2014  . Chronic systolic heart failure 09/07/39    EF improved 50-55%  . Chronic systolic CHF (congestive heart failure)     a. NICM - patent cors 06/2012, EF 40% at that time. b. 03/2013 eval: EF 20-25%.  . Chronic lower back pain   . Bladder cancer 03/15/2012    pt denies this hx on 10/15/2014    Past Surgical History  Procedure Laterality Date  .  Transthoracic echocardiogram  09-22-2010    MODERATE LVH/ EF 09%/ GRADE I DIASTOLIC DYSFUNCTION/ AORTIC SCLEROSIS WITHOUT STENOSIS/  RV  SYSTOLIC  MILDLY REDUCED FUNCTION  . Cardiovascular stress test  10-15-2010    LOW RISK NUCLEAR STUDY/ NO EVIDENCE OF ISCHEMIA/ NORMAL EF  . Shoulder surgery  1970's    "don't remember which side or what kind of OR"  . Transurethral resection of bladder tumor  10/07/2011    Procedure: TRANSURETHRAL RESECTION OF BLADDER TUMOR (TURBT);  Surgeon: Malka So, MD;  Location: [facility];  Service: Urology;  Laterality: N/A;  . Cystoscopy  10/07/2011    Procedure: CYSTOSCOPY;  Surgeon: Malka So, MD;  Location: [facility];  Service: Urology;  Laterality: N/A;  . Cystoscopy with biopsy  03/14/2012    Procedure: CYSTOSCOPY WITH BIOPSY;  Surgeon: Malka So, MD;  Location: [facility];  Service: Urology;  Laterality: N/A;  WITH FULGURATION  . Transurethral resection of prostate  03/14/2012    Procedure: TRANSURETHRAL RESECTION OF THE PROSTATE WITH GYRUS INSTRUMENTS;  Surgeon: Malka So, MD;  Location: [facility];  Service: Urology;  Laterality: N/A;  . Tonsillectomy    . Cataract  extraction w/ intraocular lens  implant, bilateral Bilateral   . Cardiac catheterization  05-11-2004  DR [place] PLAQUE/ NORMAL LVF/ EF 55%/  NON-OBSTRUCTIVE LAD 25%  . Cardiac catheterization  06/2012  . Eye surgery    . Amputation Left 10/16/2014    Procedure: AMPUTATION ABOVE KNEE- LEFT;  Surgeon: Mal Misty, MD;  Location: [facility];  Service: Vascular;  Laterality: Left;       History of present illness and  Hospital Course:     Kindly see H&P for history of present illness and admission details, please review complete Labs, Consult reports and Test reports for all details in brief  HPI  from the history and physical done on the day of admission  [name] history of severe peripheral vascular disease, history of nonischemic cardiomyopathy with EF 20-25% in December 2015 but as of June 2016 EF now up to 50-55% with grade 1 diastolic dysfunction, history of DVT with PE in 2014 on chronic warfarin, normocytic anemia and history of bladder cancer/BPH. He was most recently hospitalized and subsequently discharged on 6/28 after treatment for sepsis related to gangrenous ulcers toes left foot. He was also found to have aspiration pneumonitis, acute toxic metabolic encephalopathy and warfarin-induced coagulopathy in setting of acute kidney injury. He was started on amoxicillin and because of his acute renal failure with hyperkalemia presentation his Cozaar and Aldactone were discontinued. During the hospitalization he was evaluated by vascular surgery/Dr. Kellie Simmering who recommended an above the knee amputation noting patient was not a candidate for an open bypass procedure. Patient's son (who lives out of state in [place]) was responsible for making medical decisions for his father during the last hospitalization. At that time medical therapy was opted for as opposed to surgical intervention. The son opted to allow the patient to make a decision regarding  amputation and at that time the patient apparently did not desire to proceed with surgical intervention. At time of discharge it was documented that the son wanted to get a second opinion and discuss further with the patient's PCP. The discharging physician recommended hospice or palliative care if the patient opted not to go through with the amputation, primarily for pain management and to discuss end-of-life goals. It  appears that since discharge a decision has been made to proceed with surgical intervention and the patient is thus being admitted to the medical team to prepare for surgical procedure i.e. AKA on 7/6 by Dr. Kellie Simmering. Patient's Coumadin has been on hold for 5 days and he has been on Lovenox bridge due to his history of DVT/PE.  Upon my evaluation of the patient after arrival to this hospital he primarily is complaining of uncontrollable pain in the left leg and foot. His caretaker who has been with him for 3 years was at the bedside and assisting the history.   Hospital Course   81 year old male with known severe peripheral vascular disease, nonischemic cardiomyopathy with EF 20-25% in December 2015 but as of June 2016 EF now up to 50-55% with grade 1 diastolic dysfunction, history of DVT with PE in 2014 on chronic warfarin, recently treated for gangrenous ulcers on the left toes, discharge 10/08/14. Dr. Kellie Simmering recommended an above the knee amputation noting patient was not a candidate for an open bypass procedure. Patient admitted 10/15/14 the medical team for surgical procedure, patient went left above-knee amputation by Dr. Kellie Simmering 08/16/14.  Ischemic ulcer of left foot secondary to PVD - Not a candidate for open bypass procedure, underwent left above-knee amputation 10/16/14 by Dr. Kellie Simmering. - Management as per vascular surgery. - Stopped IV antibiotics, no fever or leukocytosis. - Discharge to CIR for PT/OT  CAD- non obstructive disease by cath 3/14 - Denies any chest pain or shortness of  breath, continue with beta blockers, and statin   Chronic combined systolic and diastolic heart failure, NYHA class 1 - compensated , last 2 D ECHO, EF has normalized to 50% - monitor daily weights, strict I/O - weight on admission 70.6 kg - Continue with metoprolol, Imdur and hydralazine, no ACE inhibitor or ARB given his renal failure.  Anemia - Acute blood loss anemia postoperatively, hemoglobin today is 8.2 ,will monitor closely, transfuse as needed for hemoglobin less than 7. Will start on iron supplement.   History of pulmonary embolism in 2014/DVT of lower extremity, bilateral - on warfarin   CKD stage IV - last admission, Cr as high as 2 - Creatinine is 1..2 today  Dementia, with acute hospital delirium -Continue Aricept, - Acute delirium postoperatively, resolved   BPH /history prostate cancer -Continue Flomax  Discharge Condition: STABLE   Follow UP  Follow-up Information    Follow up with Tinnie Gens, MD In 4 weeks.   Specialties:  Vascular Surgery, Interventional Cardiology, Cardiology   Why:  Office will call you to arrange your appt (sent)   Contact information:   [place] [address] [phone]         Discharge Instructions  and  Discharge Medications         Discharge Instructions    Call MD for:  redness, tenderness, or signs of infection (pain, swelling, bleeding, redness, odor or green/yellow discharge around incision site)    Complete by:  As directed      Call MD for:  severe or increased pain, loss or decreased feeling  in affected limb(s)    Complete by:  As directed      Call MD for:  temperature >100.5    Complete by:  As directed      Discharge instructions    Complete by:  As directed   Follow with Primary MD Redge Gainer, MD after dischrge from [place].  Get CBC, CMP, 2 view Chest X ray  checked  by Primary MD next visit.    Activity: As tolerated with Full fall precautions use walker/cane & assistance as  needed   Disposition CIR   Diet: Heart Healthy  , with feeding assistance and aspiration precautions.  For Heart failure patients - Check your Weight same time everyday, if you gain over 2 pounds, or you develop in leg swelling, experience more shortness of breath or chest pain, call your Primary MD immediately. Follow Cardiac Low Salt Diet and 1.5 lit/day fluid restriction.   On your next visit with your primary care physician please Get Medicines reviewed and adjusted.   Please request your Prim.MD to go over all Hospital Tests and Procedure/Radiological results at the follow up, please get all Hospital records sent to your Prim MD by signing hospital release before you go home.   If you experience worsening of your admission symptoms, develop shortness of breath, life threatening emergency, suicidal or homicidal thoughts you must seek medical attention immediately by calling 911 or calling your MD immediately  if symptoms less severe.  You Must read complete instructions/literature along with all the possible adverse reactions/side effects for all the Medicines you take and that have been prescribed to you. Take any new Medicines after you have completely understood and accpet all the possible adverse reactions/side effects.   Do not drive, operating heavy machinery, perform activities at heights, swimming or participation in water activities or provide baby sitting services if your were admitted for syncope or siezures until you have seen by Primary MD or a Neurologist and advised to do so again.  Do not drive when taking Pain medications.    Do not take more than prescribed Pain, Sleep and Anxiety Medications  Special Instructions: If you have smoked or chewed Tobacco  in the last 2 yrs please stop smoking, stop any regular Alcohol  and or any Recreational drug use.  Wear Seat belts while driving.   Please note  You were cared for by a hospitalist during your hospital stay. If  you have any questions about your discharge medications or the care you received while you were in the hospital after you are discharged, you can call the unit and asked to speak with the hospitalist on call if the hospitalist that took care of you is not available. Once you are discharged, your primary care physician will handle any further medical issues. Please note that NO REFILLS for any discharge medications will be authorized once you are discharged, as it is imperative that you return to your primary care physician (or establish a relationship with a primary care physician if you do not have one) for your aftercare needs so that they can reassess your need for medications and monitor your lab values.     Resume previous diet    Complete by:  As directed      may wash over wound with mild soap and water    Complete by:  As directed             Medication List    STOP taking these medications        amoxicillin 500 MG capsule  Commonly known as:  AMOXIL     enoxaparin 80 MG/0.8ML injection  Commonly known as:  LOVENOX     HORSE CHESTNUT PO     HYDROcodone-acetaminophen 5-325 MG per tablet  Commonly known as:  NORCO/VICODIN      TAKE these medications        bisacodyl 10  MG suppository  Commonly known as:  DULCOLAX  Place 1 suppository (10 mg total) rectally daily as needed for moderate constipation.     docusate sodium 100 MG capsule  Commonly known as:  COLACE  Take 1 capsule (100 mg total) by mouth 2 (two) times daily.     donepezil 10 MG tablet  Commonly known as:  ARICEPT  TAKE ONE TABLET AT BEDTIME     ferrous sulfate 325 (65 FE) MG tablet  Commonly known as:  FERROUSUL  Take 1 tablet (325 mg total) by mouth daily with breakfast.     fluticasone 50 MCG/ACT nasal spray  Commonly known as:  FLONASE  Place 2 sprays into both nostrils daily.     furosemide 40 MG tablet  Commonly known as:  LASIX  40 MG ON MON, WED, AND FRI'S; THEN 20 MG ALL OTHER DAYS      gabapentin 100 MG capsule  Commonly known as:  NEURONTIN  TAKE (2) CAPSULES TWICE DAILY.     isosorbide mononitrate 30 MG 24 hr tablet  Commonly known as:  IMDUR  Take 0.5 tablets (15 mg total) by mouth daily.     lidocaine 2 % jelly  Commonly known as:  XYLOCAINE  APPLY AS NEEDED TO THE LEFT FOOD ULCER     megestrol 20 MG tablet  Commonly known as:  MEGACE  TAKE 1 TABLET ONCE A DAY     metoprolol succinate 50 MG 24 hr tablet  Commonly known as:  TOPROL-XL  TAKE (1) TABLET TWICE A DAY.     nitroGLYCERIN 0.4 MG SL tablet  Commonly known as:  NITROSTAT  Place 1 tablet (0.4 mg total) under the tongue every 5 (five) minutes as needed for chest pain (up to 3 doses).     oxyCODONE 5 MG immediate release tablet  Commonly known as:  Oxy IR/ROXICODONE  Take 1 tablet (5 mg total) by mouth every 4 (four) hours as needed for moderate pain.     oxyCODONE-acetaminophen 5-325 MG per tablet  Commonly known as:  PERCOCET  Take 1 tablet by mouth every 4 (four) hours as needed for severe pain.     potassium chloride SA 20 MEQ tablet  Commonly known as:  K-DUR,KLOR-CON  Take 1 tablet (20 mEq total) by mouth daily.     simvastatin 10 MG tablet  Commonly known as:  ZOCOR  TAKE 1 TABLET DAILY     tamsulosin 0.4 MG Caps capsule  Commonly known as:  FLOMAX  TAKE (1) CAPSULE DAILY     ULORIC 40 MG tablet  Generic drug:  febuxostat  TAKE 1 TABLET DAILY     warfarin 2.5 MG tablet  Commonly known as:  COUMADIN  PLEASE CONSULT Inpatient pharmacy with phone admission, for them to continue dosing and monitoring of warfarin level.          Diet and Activity recommendation: See Discharge Instructions above   Consults obtained - Vascular Sx   Major procedures and Radiology Reports - PLEASE review detailed and final reports for all details, in brief -   Left AKA 7/6    Dg Chest 1 View  10/03/2014   CLINICAL DATA:  Chest congestion and shortness of breath. The patient reports  declining health for 3 months.  EXAM: CHEST  1 VIEW  COMPARISON:  PA and lateral chest 03/26/2014.  FINDINGS: The lungs are clear. Heart size is upper normal. No pneumothorax or pleural effusion. No focal bony abnormality.  IMPRESSION: No acute  disease.   Electronically Signed   By: Inge Rise M.D.   On: 10/03/2014 17:36   Ct Head Wo Contrast  10/03/2014   CLINICAL DATA:  Altered mental status decline for 3 months.  EXAM: CT HEAD WITHOUT CONTRAST  TECHNIQUE: Contiguous axial images were obtained from the base of the skull through the vertex without intravenous contrast.  COMPARISON:  CT 07/10/2013  FINDINGS: No acute intracranial hemorrhage. No focal mass lesion. No CT evidence of acute infarction. No midline shift or mass effect. No hydrocephalus. Basilar cisterns are patent.  Remote infarction in the left centrum semiovale. There is generalized cortical atrophy. Partial ventricular dilatation. Mild periventricular white matter hypodensities.  13 mm round lesion in the sella which have has low-density is not changed from prior.  IMPRESSION: 1. No acute intracranial findings. 2. Chronic atrophy and white matter microvascular disease. 3. 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 4. Stable low-density cystic lesion in the sella   Electronically Signed   By: Suzy Bouchard M.D.   On: 10/03/2014 14:16   Dg Chest Port 1 View  10/15/2014   CLINICAL DATA:  Pre-op for left-sided above the knee amputation. History of hypertension and cardiac catheterization. Initial encounter.  EXAM: PORTABLE CHEST - 1 VIEW  COMPARISON:  10/05/2014 and 10/03/2014 radiographs.  CT 04/10/2013.  FINDINGS: 1638 hours. The heart size and mediastinal contours are stable. There is aortic atherosclerosis with stable ectasia of the right brachiocephalic artery. There is interval improved aeration of the lungs which are now clear. There is some residual blunting of the right costophrenic angle which may reflect a small right-sided  pleural effusion. The bones appear unremarkable.  IMPRESSION: Interval improved aeration of the lungs with possible small residual right pleural effusion. No acute findings.   Electronically Signed   By: Richardean Sale M.D.   On: 10/15/2014 16:47   Dg Chest Port 1 View  10/05/2014   CLINICAL DATA:  Shortness of breath, cough, congestion, wheezing.  EXAM: PORTABLE CHEST - 1 VIEW  COMPARISON:  10/03/2014; 03/26/2014; 03/14/2013 ; chest CT- 04/10/2013  FINDINGS: Grossly unchanged enlarged cardiac silhouette and mediastinal contours given decreased lung volumes and patient rotation. Atherosclerotic plaque within thoracic aorta. There is persistent thickening of the right paratracheal stripe secondary to prominent vasculature as demonstrated on prior chest CT performed 03/2013. Pulmonary venous congestion without frank evidence of edema. Worsening bibasilar and right perihilar opacities, right greater than left. No definite pleural effusion or pneumothorax. Unchanged bones.  IMPRESSION: Worsening right perihilar and bibasilar opacities, right greater than left, possibly atelectasis fell aspiration and/or infection could have a similar appearance. Further evaluation with a PA and lateral chest radiograph may be obtained as clinically indicated.   Electronically Signed   By: Sandi Mariscal M.D.   On: 10/05/2014 08:59   Dg Toe 5th Left  10/03/2014   CLINICAL DATA:  One month history of persistent skin ulceration. Pain and swelling.  EXAM: DG TOE 5TH LEFT  COMPARISON:  09/12/2014  FINDINGS: Knee PIP joint appears slightly narrowed compared to the prior study. There is air surrounding the joint and there is slight cortical irregularity along the medial aspect of the distal aspect of the proximal phalanx. Could not exclude septic arthritis and osteomyelitis. MRI is recommended for further evaluation.  IMPRESSION: Diffuse soft tissue swelling involving the fifth toe with air in the soft tissues.  Could not exclude septic  arthritis and osteomyelitis centered at the PIP joint of the fifth toe. MRI without and  with contrast is recommended for further evaluation.   Electronically Signed   By: Marijo Sanes M.D.   On: 10/03/2014 12:53    Micro Results     Recent Results (from the past 240 hour(s))  Surgical pcr screen     Status: Abnormal   Collection Time: 10/15/14  6:07 PM  Result Value Ref Range Status   MRSA, PCR POSITIVE (A) NEGATIVE Final   Staphylococcus aureus POSITIVE (A) NEGATIVE Final    Comment:        The Xpert SA Assay (FDA approved for NASAL specimens in patients over 87 years of age), is one component of a comprehensive surveillance program.  Test performance has been validated by [facility] for patients greater than or equal to 57 year old. It is not intended to diagnose infection nor to guide or monitor treatment.        Today   Subjective:   Scott [name] today has no headache,no chest abdominal pain,no new weakness tingling or numbness, feels much better today.  Objective:   Blood pressure 123/53, pulse 91, temperature 99.2 F (37.3 C), temperature source Oral, resp. rate 20, height 5\' 10"  (1.778 m), weight 61.2 kg (134 lb 14.7 oz), SpO2 94 %.   Intake/Output Summary (Last 24 hours) at 10/18/14 1032 Last data filed at 10/18/14 0807  Gross per 24 hour  Intake    960 ml  Output    300 ml  Net    660 ml    Exam Patient is awake, communicative, much more coherent today [place].AT,PERRAL Supple Neck,No JVD,  Symmetrical Chest wall movement, Good air movement bilaterally,  RRR,No Gallops,Rubs  +ve B.Sounds, Abd Soft, No tenderness, No organomegaly appriciated, No rebound - guarding or rigidity. No Cyanosis, Clubbingand right lower extremity, +1 edema, left lower extremity with above-knee amputation , with staples, wound looks clean, no bleeding or discharge. Data Review   Data Review   CBC w Diff:  Lab Results  Component Value Date   WBC 9.7 10/18/2014   WBC  4.6 07/30/2014   WBC 4.7 03/26/2014   WBC 3.5* 01/13/2011   HGB 8.2* 10/18/2014   HGB 10.7* 07/30/2014   HGB 11.5* 01/13/2011   HCT 25.3* 10/18/2014   HCT 36.0* 07/30/2014   HCT 33.7* 01/13/2011   PLT 333 10/18/2014   PLT 207 01/13/2011   LYMPHOPCT 9* 10/03/2014   LYMPHOPCT 13.8* 01/13/2011   MONOPCT 9 10/03/2014   MONOPCT 11.5 01/13/2011   EOSPCT 3 10/03/2014   EOSPCT 1.0 01/13/2011   BASOPCT 0 10/03/2014   BASOPCT 0.6 01/13/2011    CMP:  Lab Results  Component Value Date   NA 139 10/18/2014   NA 139 08/26/2014   K 4.2 10/18/2014   CL 107 10/18/2014   CO2 26 10/18/2014   BUN 21* 10/18/2014   BUN 45* 08/26/2014   CREATININE 1.24 10/18/2014   CREATININE 0.91 11/02/2012   PROT 6.7 10/15/2014   PROT 6.8 07/02/2014   ALBUMIN 3.0* 10/15/2014   BILITOT 0.3 10/15/2014   BILITOT 0.3 07/02/2014   ALKPHOS 98 10/15/2014   AST 45* 10/15/2014   ALT 43 10/15/2014  .   Total Time in preparing paper work, data evaluation and todays exam - 35 minutes  ELGERGAWY, DAWOOD M.D on 10/18/2014 at 10:32 AM  [facility]   Office  [phone]

## 2014-10-19 ENCOUNTER — Inpatient Hospital Stay (HOSPITAL_COMMUNITY): Payer: Medicare Other | Admitting: Physical Therapy

## 2014-10-19 ENCOUNTER — Inpatient Hospital Stay (HOSPITAL_COMMUNITY): Payer: Medicare Other | Admitting: Occupational Therapy

## 2014-10-19 LAB — COMPREHENSIVE METABOLIC PANEL
ALT: 24 U/L (ref 17–63)
AST: 29 U/L (ref 15–41)
Albumin: 2.2 g/dL — ABNORMAL LOW (ref 3.5–5.0)
Alkaline Phosphatase: 76 U/L (ref 38–126)
Anion gap: 6 (ref 5–15)
BUN: 20 mg/dL (ref 6–20)
CO2: 26 mmol/L (ref 22–32)
Calcium: 8.8 mg/dL — ABNORMAL LOW (ref 8.9–10.3)
Chloride: 104 mmol/L (ref 101–111)
Creatinine, Ser: 1 mg/dL (ref 0.61–1.24)
GFR calc Af Amer: >60 mL/min (ref >60)
GFR calc non Af Amer: >60 mL/min (ref >60)
Glucose, Bld: 92 mg/dL (ref 65–99)
Potassium: 4.1 mmol/L (ref 3.5–5.1)
Sodium: 136 mmol/L (ref 135–145)
Total Bilirubin: 0.4 mg/dL (ref 0.3–1.2)
Total Protein: 5.6 g/dL — ABNORMAL LOW (ref 6.5–8.1)

## 2014-10-19 LAB — CBC WITH DIFFERENTIAL/PLATELET
Basophils Absolute: 0 10*3/uL (ref 0.0–0.1)
Basophils Relative: 0 % (ref 0–1)
Eosinophils Absolute: 0.2 10*3/uL (ref 0.0–0.7)
Eosinophils Relative: 2 % (ref 0–5)
HCT: 23.8 % — ABNORMAL LOW (ref 39.0–52.0)
Hemoglobin: 7.6 g/dL — ABNORMAL LOW (ref 13.0–17.0)
Lymphocytes Relative: 15 % (ref 12–46)
Lymphs Abs: 1 10*3/uL (ref 0.7–4.0)
MCH: 26.2 pg (ref 26.0–34.0)
MCHC: 31.9 g/dL (ref 30.0–36.0)
MCV: 82.1 fL (ref 78.0–100.0)
Monocytes Absolute: 0.7 10*3/uL (ref 0.1–1.0)
Monocytes Relative: 10 % (ref 3–12)
Neutro Abs: 5.3 10*3/uL (ref 1.7–7.7)
Neutrophils Relative %: 74 % (ref 43–77)
Platelets: 317 10*3/uL (ref 150–400)
RBC: 2.9 MIL/uL — ABNORMAL LOW (ref 4.22–5.81)
RDW: 14.7 % (ref 11.5–15.5)
WBC: 7.1 10*3/uL (ref 4.0–10.5)

## 2014-10-19 LAB — PROTIME-INR
INR: 2.32 — ABNORMAL HIGH (ref 0.00–1.49)
Prothrombin Time: 25.2 seconds — ABNORMAL HIGH (ref 11.6–15.2)

## 2014-10-19 MED ORDER — VITAMIN B-1 100 MG PO TABS
100.0000 mg | ORAL_TABLET | Freq: Every day | ORAL | Status: DC
  Administered 2014-10-19 – 2014-11-04 (×17): 100 mg via ORAL
  Filled 2014-10-19 (×19): qty 1

## 2014-10-19 MED ORDER — FOLIC ACID 1 MG PO TABS
1.0000 mg | ORAL_TABLET | Freq: Every day | ORAL | Status: DC
  Administered 2014-10-19 – 2014-11-04 (×17): 1 mg via ORAL
  Filled 2014-10-19 (×18): qty 1

## 2014-10-19 MED ORDER — WARFARIN SODIUM 5 MG PO TABS
5.0000 mg | ORAL_TABLET | Freq: Once | ORAL | Status: AC
  Administered 2014-10-19: 5 mg via ORAL
  Filled 2014-10-19: qty 1

## 2014-10-19 MED ORDER — OXYCODONE HCL 5 MG PO TABS: 2.5000 mg | ORAL_TABLET | ORAL | Status: DC | PRN

## 2014-10-19 NOTE — Progress Notes (Signed)
ANTICOAGULATION CONSULT NOTE - Follow Up Consult  Pharmacy Consult for Warfarin Indication: Hx PE/DVT  Allergies  Allergen Reactions  . Lipitor [Atorvastatin] Other (See Comments)    unknown    Patient Measurements: Weight: 142 lb 1.6 oz (64.456 kg)  Vital Signs: Temp: 98.4 F (36.9 C) (07/09 0500) Temp Source: Oral (07/09 0500) BP: 130/52 mmHg (07/09 0500) Pulse Rate: 66 (07/09 0500)  Labs:  Recent Labs  10/17/14 0328 10/17/14 0830 10/18/14 0308 10/19/14 0510  HGB 8.1*  --  8.2* 7.6*  HCT 25.1*  --  25.3* 23.8*  PLT 339  --  333 317  LABPROT 22.1*  --  25.3* 25.2*  INR 1.94*  --  2.33* 2.32*  CREATININE  --  1.02 1.24 1.00    Estimated Creatinine Clearance: 43.9 mL/min (by C-G formula based on Cr of 1).   Assessment: 4 YOM who continues on warfarin for hx DVT/PE - held for AKA on 7/6 and resumed post-op. INR this morning remains therapeutic (INR 2.32 << 2.33, goal of 2-3). Hgb 7.6 << 8.2, plts wnl - no overt s/sx of bleeding noted at this time but will continue to monitor given recent AKA and risk of bleeding from stump site.  PTA dose 5 mg daily EXCEPT for 7.5 mg on Mon/Fri. The patient has been educated during this admission.    Goal of Therapy:  INR 2-3   Plan:  1. Warfarin 5 mg x 1 dose at 1800 today 2. Will continue to monitor for any signs/symptoms of bleeding and will follow up with PT/INR in the a.m.   Alycia Rossetti, PharmD, BCPS Clinical Pharmacist Pager: [phone] 10/19/2014 8:04 AM

## 2014-10-19 NOTE — Progress Notes (Signed)
[place] PHYSICAL MEDICINE & REHABILITATION     PROGRESS NOTE    Subjective/Complaints: Had some difficulties getting comfortable last night. Hard for him to turn in bed.   ROS: Pt denies fever, rash/itching, headache, blurred or double vision, nausea, vomiting, abdominal pain, diarrhea, chest pain, shortness of breath, palpitations, dysuria, dizziness, neck or back pain, bleeding, anxiety, or depression   Objective: Vital Signs: Blood pressure 130/52, pulse 66, temperature 98.4 F (36.9 C), temperature source Oral, resp. rate 17, weight 64.456 kg (142 lb 1.6 oz), SpO2 97 %. No results found.  Recent Labs  10/18/14 0308 10/19/14 0510  WBC 9.7 7.1  HGB 8.2* 7.6*  HCT 25.3* 23.8*  PLT 333 317    Recent Labs  10/18/14 0308 10/19/14 0510  NA 139 136  K 4.2 4.1  CL 107 104  GLUCOSE 111* 92  BUN 21* 20  CREATININE 1.24 1.00  CALCIUM 9.0 8.8*   CBG (last 3)   Recent Labs  10/17/14 1631 10/17/14 2128 10/18/14 1114  GLUCAP 107* 102* 96    Wt Readings from Last 3 Encounters:  10/18/14 64.456 kg (142 lb 1.6 oz)  10/18/14 61.2 kg (134 lb 14.7 oz)  10/07/14 70.6 kg (155 lb 10.3 oz)    Physical Exam:  Constitutional: He is oriented to person, place, and time. He appears well-developed and well-nourished.  HENT: dentition poor. Mucosa fairly moist Head: Normocephalic and atraumatic.  Eyes: Conjunctivae are normal. Pupils are equal, round, and reactive to light.  Neck: Normal range of motion. Neck supple.  Cardiovascular: Normal rate and regular rhythm.no murmur Respiratory: Effort normal and breath sounds normal. No respiratory distress. He has no wheezes.  GI: Soft. Bowel sounds are normal. He exhibits no distension. There is no tenderness.  Musculoskeletal: He exhibits edema.  RLE with 1+edema ----stable to improved Neurological: He is alert and oriented to person, place, and time. Improved insight and awareness. UE 4/5 prox to distal. RLE: 3/5 hf, ke  and 4/5 adf/apf, LLE: 3/5 HF,  Skin: Skin is warm and dry. Incision c/d/i  Assessment/Plan: 1. Functional deficits secondary to left AKA which require 3+ hours per day of interdisciplinary therapy in a comprehensive inpatient rehab setting. Physiatrist is providing close team supervision and 24 hour management of active medical problems listed below. Physiatrist and rehab team continue to assess barriers to discharge/monitor patient progress toward functional and medical goals. FIM:                   Comprehension Comprehension Mode: Auditory Comprehension: 4-Understands basic 75 - 89% of the time/requires cueing 10 - 24% of the time  Expression Expression Mode: Verbal Expression: 3-Expresses basic 50 - 74% of the time/requires cueing 25 - 50% of the time. Needs to repeat parts of sentences.  Social Interaction Social Interaction: 3-Interacts appropriately 50 - 74% of the time - May be physically or verbally inappropriate.  Problem Solving Problem Solving: 3-Solves basic 50 - 74% of the time/requires cueing 25 - 49% of the time  Memory Memory: 3-Recognizes or recalls 50 - 74% of the time/requires cueing 25 - 49% of the time   Medical Problem List and Plan: 1. Functional deficits secondary to L-AKA due to severe PVD with gangrenous changes.  2. H/o PE/DVT/Anticoagulation: Pharmaceutical: Coumadin 3. Pain Management: Will continue oxycodone prn 4. Mood: LCSW to follow for evaluation and support.  5. Neuropsych: This patient is not capable of making decisions on his own behalf. 6. Skin/Wound Care: Monitor wound daily for  healing. Routine pressure relief measures.  7. Fluids/Electrolytes/Nutrition: encourage PO intake. I personally reviewed the patient's labs today which are within normal limits. 8. ABLA:  iron supplement added. hgb continues to drift----will recheck tomorrow 9. Non-obstructive CAD: On BB and statin.  10. Dementia: Continue Aricept. Delirium  resolved---need to be careful with narcotis/SE 11. CKD stage IV: Improved with hydration. Monitor for now.  12. Combined systolic and diastolic CHF: Monitoring daily weights for signs of overload. On BB, nitrates and statin. Lasix Currently on hold.does not appear volume overloaded at present 13. BPH- On Flomax.   LOS (Days) 1 A FACE TO FACE EVALUATION WAS PERFORMED  SWARTZ,ZACHARY T 10/19/2014 8:23 AM

## 2014-10-19 NOTE — Progress Notes (Signed)
Occupational Therapy Assessment and Plan  Patient Details  Name: Scott [name] MRN: [id number] Date of Birth: March 14, 1922  OT Diagnosis: muscle weakness (generalized) and pain in joint Rehab Potential: Rehab Potential (ACUTE ONLY): Good ELOS: 14 days   Today's Date: 10/19/2014 OT Individual Time: 1530-1700 OT Individual Time Calculation (min): 90 min     Problem List:  Patient Active Problem List   Diagnosis Date Noted  . Status post above knee amputation of left lower extremity 10/18/2014  . Tachyarrhythmia 10/05/2014  . Aspiration pneumonia 10/05/2014  . UTI (lower urinary tract infection) 10/03/2014  . Sepsis due to other etiology 10/03/2014  . Gangrene of toe 10/03/2014  . Ischemic ulcer of left foot 10/03/2014  . Atherosclerotic PVD with ulceration 10/03/2014  . Warfarin-induced coagulopathy 10/03/2014  . Atrial fibrillation with RVR 10/03/2014  . Acute kidney injury 10/03/2014  . Hyperkalemia 10/03/2014  . Cellulitis of left leg 10/03/2014  . PAD (peripheral artery disease) 09/24/2014  . NSTEMI (non-ST elevated myocardial infarction) 10/09/2013  . High risk medication use 05/17/2013  . BPH (benign prostatic hyperplasia) 05/17/2013  . Vitamin D deficiency 05/17/2013  . History of pulmonary embolism (2014) 04/17/2013  . NSVT (nonsustained ventricular tachycardia)- not an ICD candidate 04/17/2013  . NICM (nonischemic cardiomyopathy)- EF 20-25% by Echo 04/12/13 04/17/2013  . Chronic diastolic heart failure, NYHA class 1 03/15/2013  . DVT of lower extremity, bilateral 03/15/2013  . Fatigue 11/02/2012  . Constipation 11/02/2012  . Alcohol abuse 08/27/2012  . Syncope 06/21/2012  . Bladder cancer 03/15/2012  . Benign localized hyperplasia of prostate with urinary obstruction 03/15/2012  . Benign essential HTN   . Hyperlipidemia   . PVCs (premature ventricular contractions)   . CAD- non obstructive disease by cath 3/14     Past Medical History:  Past Medical History   Diagnosis Date  . HTN (hypertension)   . Hyperlipidemia   . PVCs (premature ventricular contractions)   . Glaucoma   . PVD (peripheral vascular disease)     Right ABI .75, Left .78 (2006)  . Popliteal artery aneurysm, bilateral DECUMENTED CHRONIC PARTIAL OCCLUSION--  PT DENIES CLAUDICATION OR ANY OTHER SYMPTOMS  . Frequency of urination   . Nocturia   . DVT, bilateral lower limbs 03/2013    a. Bilat DVT dx 03/2013.  . Pulmonary embolism     a. By CT angio 04/2013.  Marland Kitchen Elevated troponin     a. Adm 12/30-04/2013 - not felt to represent ACS; ?due to PE. b. Patent cors 06/2012.  Marland Kitchen NSVT (nonsustained ventricular tachycardia)     a. NSVT 06/2012; NSVT also seen during 03/2013 adm. b. Med rx. Not candidate for ICD given adv age.  Marland Kitchen ETOH abuse   . Syncope     a. Felt to be postural syncope related to diuretics 06/2012.  Marland Kitchen Heart murmur   . DVT (deep venous thrombosis)     "I think in both legs"  . Pneumonia     "couple times"  . DDD (degenerative disc disease)   . Arthritis     "all over"  . Depression   . CAD- non obstructive disease by cath 3/14   . PAD (peripheral artery disease)   . Gangrene of toe 10/03/2014  . Sepsis due to other etiology 10/03/2014  . Chronic systolic heart failure 08/05/81    EF improved 50-55%  . Chronic systolic CHF (congestive heart failure)     a. NICM - patent cors 06/2012, EF 40% at that time. b. 03/2013  eval: EF 20-25%.  . Chronic lower back pain   . Bladder cancer 03/15/2012    pt denies this hx on 10/15/2014   Past Surgical History:  Past Surgical History  Procedure Laterality Date  . Transthoracic echocardiogram  09-22-2010    MODERATE LVH/ EF 16%/ GRADE I DIASTOLIC DYSFUNCTION/ AORTIC SCLEROSIS WITHOUT STENOSIS/  RV  SYSTOLIC  MILDLY REDUCED FUNCTION  . Cardiovascular stress test  10-15-2010    LOW RISK NUCLEAR STUDY/ NO EVIDENCE OF ISCHEMIA/ NORMAL EF  . Shoulder surgery  1970's    "don't remember which side or what kind of OR"  . Transurethral  resection of bladder tumor  10/07/2011    Procedure: TRANSURETHRAL RESECTION OF BLADDER TUMOR (TURBT);  Surgeon: Scott So, MD;  Location: [facility];  Service: Urology;  Laterality: N/A;  . Cystoscopy  10/07/2011    Procedure: CYSTOSCOPY;  Surgeon: Scott So, MD;  Location: [facility];  Service: Urology;  Laterality: N/A;  . Cystoscopy with biopsy  03/14/2012    Procedure: CYSTOSCOPY WITH BIOPSY;  Surgeon: Scott So, MD;  Location: [facility];  Service: Urology;  Laterality: N/A;  WITH FULGURATION  . Transurethral resection of prostate  03/14/2012    Procedure: TRANSURETHRAL RESECTION OF THE PROSTATE WITH GYRUS INSTRUMENTS;  Surgeon: Scott So, MD;  Location: [facility];  Service: Urology;  Laterality: N/A;  . Tonsillectomy    . Cataract extraction w/ intraocular lens  implant, bilateral Bilateral   . Cardiac catheterization  05-11-2004  DR [place] PLAQUE/ NORMAL LVF/ EF 55%/  NON-OBSTRUCTIVE LAD 25%  . Cardiac catheterization  06/2012  . Eye surgery    . Amputation Left 10/16/2014    Procedure: AMPUTATION ABOVE KNEE- LEFT;  Surgeon: Scott Misty, MD;  Location: [facility];  Service: Vascular;  Laterality: Left;    Assessment & Plan Clinical Impression:81 y.o. male with history of HTN, BLE DVT/ PE, A fib- chronic coumadin, NICM, severe PVD with gangrenous changes left foot with recommendations for amputation due to non-salvageable limb. Patient elected to undergo L-AKA on 10/16/14 by Dr. Kellie [name]. Post op with agitation due to delirium as well as ABLA.IV antibiotics d/c and coumadin resumed. Delirium has resolved and he was able to participate in PT/OT evaluations. Retention socks ordered by [employer]    Pt.  transferred to Scott [name] on 10/18/2014 .    Patient currently requires max with basic self-care skills secondary to muscle weakness.  Prior to hospitalization, patient could complete BADL with independent .  Patient will benefit from skilled  intervention to increase independence with basic self-care skills prior to discharge home with care partner.  Anticipate patient will require intermittent supervision and follow up home health.  OT - End of Session Activity Tolerance: Tolerates 10 - 20 min activity with multiple rests Endurance Deficit: Yes Endurance Deficit Description: cardiorespiratory OT Assessment Rehab Potential (ACUTE ONLY): Good Barriers to Discharge:  (none) OT Plan OT Intensity: Minimum of 1-2 x/day, 45 to 90 minutes OT Frequency: 5 out of 7 days OT Duration/Estimated Length of Stay: 14 days OT Treatment/Interventions: Balance/vestibular training;Discharge planning;DME/adaptive equipment instruction;Functional mobility training;Pain management;Patient/family education;Self Care/advanced ADL retraining;Therapeutic Activities;Therapeutic Exercise;UE/LE Strength taining/ROM;UE/LE Coordination activities;Wheelchair propulsion/positioning OT Self Feeding Anticipated Outcome(s): independent OT Basic Self-Care Anticipated Outcome(s): minimal assist OT Toileting Anticipated Outcome(s): minimal assist OT Bathroom Transfers Anticipated Outcome(s): minimal assist OT Recommendation Recommendations for Other Services:  (none) Patient destination: Home Follow Up Recommendations: Home health OT Equipment Recommended: Standard  walker;Tub/shower seat (has builet in shower seat)   Skilled Therapeutic Intervention Pt. Lethargic and unable to attend to therapy session by end of day.  Sitter reported pt takes naps a lot at home and has gotten weaker over the past 6 weeks since he had gout.   EVal limited due to pt fatigue.    OT Evaluation Precautions/Restrictions  Precautions Precautions: Fall Precaution Comments: contact Restrictions Weight Bearing Restrictions: Yes LLE Weight Bearing: Non weight bearing       Pain  none   Home Living/Prior Functioning Home Living Available Help at Discharge: Available 24  hours/day, Personal care attendant Type of Home: House Home Access: Ramped entrance Home Layout: One level Additional Comments: Hoyer lift as pt's wife has paraplegia - personal care attendant is assigned for pt's wife; Pt already owns [facility] and 3 point cane  Lives With: Spouse IADL History Homemaking Responsibilities: Yes Meal Prep Responsibility: Secondary Laundry Responsibility: Primary Cleaning Responsibility: No Bill Paying/Finance Responsibility: Primary (Pt. wrote checks and had back up if needed) Shopping Responsibility:  (Pt. 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[employer]) Leisure and Hobbies:  (garden ) Prior Function Level of Independence: Needs assistance with ADLs  Able to Take Stairs?: Yes Driving: No Vocation: Retired Leisure: Hobbies-yes (Comment) (gardens) Comments: ambulated with a cane in community PTA (Did ) ADL   Vision/Perception  Vision- History Baseline Vision/History: Wears glasses Wears Glasses: At all times Patient Visual Report: No change from baseline Vision- Assessment Vision Assessment?: No apparent visual deficits Perception Perception: Within Functional Limits Praxis Praxis: Intact  Cognition Overall Cognitive Status: Impaired/Different from baseline Orientation Level: Place;Person;Situation Month: July Day of Week: Correct Memory: Impaired Memory Impairment: Decreased recall of new information Immediate Memory Recall:  (Pt very fatigued at end of day and unable) Memory Recall:  (o/3 words ) Attention: Focused;Sustained Focused Attention: Appears intact Sustained Attention: Impaired Sustained Attention Impairment: Verbal complex;Verbal basic;Functional basic Awareness: Appears intact Problem Solving: Impaired Problem Solving Impairment: Verbal complex Behaviors: Lability;Poor frustration tolerance Safety/Judgment: Appears  intact Sensation Sensation Light Touch: Impaired Detail (intact for BUE) Light Touch Impaired Details: Impaired RLE Stereognosis: Not tested Hot/Cold: Not tested Proprioception: Not tested Coordination Gross Motor Movements are Fluid and Coordinated: No Fine Motor Movements are Fluid and Coordinated: No Coordination and Movement Description: slowed movements Motor  Motor Motor: Abnormal postural alignment and control Motor - Skilled Clinical Observations: slouched sitting posture Mobility  Bed Mobility Bed Mobility:  (SEE PT )  Trunk/Postural Assessment  Cervical Assessment Cervical Assessment: Within Functional Limits Thoracic Assessment Thoracic Assessment: Within Functional Limits Lumbar Assessment Lumbar Assessment: Within Functional Limits Postural Control Postural Control: Deficits on evaluation Righting Reactions: delayed Protective Responses: delayed Postural Limitations: slouched sitting posture  Balance Balance Balance Assessed: Yes Static Sitting Balance Static Sitting - Balance Support: Bilateral upper extremity supported;Feet supported Static Sitting - Level of Assistance: 5: Stand by assistance Dynamic Sitting Balance Dynamic Sitting - Balance Support: Bilateral upper extremity supported;Feet supported Dynamic Sitting - Level of Assistance: 4: Min Insurance risk surveyor Standing - Balance Support: Bilateral upper extremity supported;During functional activity Static Standing - Level of Assistance: 3: Mod assist Dynamic Standing Balance Dynamic Standing - Balance Support: Bilateral upper extremity supported;During functional activity Dynamic Standing - Level of Assistance: 2: Max assist Extremity/Trunk Assessment RUE Assessment RUE Assessment: Within Functional Limits LUE Assessment LUE Assessment: Within Functional Limits  FIM:  FIM - Eating  Eating Activity: 6: More than reasonable amount of time FIM - Grooming Grooming: 0: Activity  did not occur FIM - Bathing Bathing: 0: Activity did not occur FIM - Upper Body Dressing/Undressing Upper body dressing/undressing: 0: Activity did not occur FIM - Lower Body Dressing/Undressing Lower body dressing/undressing: 0: Wears gown/pajamas-no public clothing FIM - Toileting Toileting: 0: Activity did not occur FIM - Air cabin crew Transfers: 0-Activity did not occur FIM - Camera operator Transfers: 0-Activity did not occur or was simulated   Refer to Care Plan for Long Term Goals  Recommendations for other services: None  Discharge Criteria: Patient will be discharged from OT if patient refuses treatment 3 consecutive times without medical reason, if treatment goals not met, if there is a change in medical status, if patient makes no progress towards goals or if patient is discharged from hospital.  The above assessment, treatment plan, treatment alternatives and goals were discussed and mutually agreed upon: by patient and by family  Lisa Roca 10/19/2014, 7:06 PM

## 2014-10-19 NOTE — Plan of Care (Signed)
Problem: RH BOWEL ELIMINATION Goal: RH STG MANAGE BOWEL W/MEDICATION W/ASSISTANCE STG Manage Bowel with Medication with min Assistance.  Outcome: Not Progressing Constipated LBM 7/5 per report  Problem: RH BLADDER ELIMINATION Goal: RH STG MANAGE BLADDER WITH ASSISTANCE STG Manage Bladder With Assistance  Outcome: Not Progressing Condom cath all times due to urgency

## 2014-10-19 NOTE — Evaluation (Signed)
Physical Therapy Assessment and Plan  Patient Details  Name: Scott [name] MRN: [id number] Date of Birth: 27-Oct-1921  PT Diagnosis: Abnormal posture, Cognitive deficits, Difficulty walking, Edema, Impaired cognition, Impaired sensation, Muscle weakness and Pain in L hip and R heel Rehab Potential: Good ELOS: 2 weeks   Today's Date: 10/19/2014 PT Individual Time: 1000-1100 Treatment Session 2: 1062-6948 PT Individual Time Calculation (min): 60 min   Treatment Session 2: 45 min  Problem List:  Patient Active Problem List   Diagnosis Date Noted  . Status post above knee amputation of left lower extremity 10/18/2014  . Tachyarrhythmia 10/05/2014  . Aspiration pneumonia 10/05/2014  . UTI (lower urinary tract infection) 10/03/2014  . Sepsis due to other etiology 10/03/2014  . Gangrene of toe 10/03/2014  . Ischemic ulcer of left foot 10/03/2014  . Atherosclerotic PVD with ulceration 10/03/2014  . Warfarin-induced coagulopathy 10/03/2014  . Atrial fibrillation with RVR 10/03/2014  . Acute kidney injury 10/03/2014  . Hyperkalemia 10/03/2014  . Cellulitis of left leg 10/03/2014  . PAD (peripheral artery disease) 09/24/2014  . NSTEMI (non-ST elevated myocardial infarction) 10/09/2013  . High risk medication use 05/17/2013  . BPH (benign prostatic hyperplasia) 05/17/2013  . Vitamin D deficiency 05/17/2013  . History of pulmonary embolism (2014) 04/17/2013  . NSVT (nonsustained ventricular tachycardia)- not an ICD candidate 04/17/2013  . NICM (nonischemic cardiomyopathy)- EF 20-25% by Echo 04/12/13 04/17/2013  . Chronic diastolic heart failure, NYHA class 1 03/15/2013  . DVT of lower extremity, bilateral 03/15/2013  . Fatigue 11/02/2012  . Constipation 11/02/2012  . Alcohol abuse 08/27/2012  . Syncope 06/21/2012  . Bladder cancer 03/15/2012  . Benign localized hyperplasia of prostate with urinary obstruction 03/15/2012  . Benign essential HTN   . Hyperlipidemia   . PVCs (premature  ventricular contractions)   . CAD- non obstructive disease by cath 3/14     Past Medical History:  Past Medical History  Diagnosis Date  . HTN (hypertension)   . Hyperlipidemia   . PVCs (premature ventricular contractions)   . Glaucoma   . PVD (peripheral vascular disease)     Right ABI .75, Left .78 (2006)  . Popliteal artery aneurysm, bilateral DECUMENTED CHRONIC PARTIAL OCCLUSION--  PT DENIES CLAUDICATION OR ANY OTHER SYMPTOMS  . Frequency of urination   . Nocturia   . DVT, bilateral lower limbs 03/2013    a. Bilat DVT dx 03/2013.  . Pulmonary embolism     a. By CT angio 04/2013.  Marland Kitchen Elevated troponin     a. Adm 12/30-04/2013 - not felt to represent ACS; ?due to PE. b. Patent cors 06/2012.  Marland Kitchen NSVT (nonsustained ventricular tachycardia)     a. NSVT 06/2012; NSVT also seen during 03/2013 adm. b. Med rx. Not candidate for ICD given adv age.  Marland Kitchen ETOH abuse   . Syncope     a. Felt to be postural syncope related to diuretics 06/2012.  Marland Kitchen Heart murmur   . DVT (deep venous thrombosis)     "I think in both legs"  . Pneumonia     "couple times"  . DDD (degenerative disc disease)   . Arthritis     "all over"  . Depression   . CAD- non obstructive disease by cath 3/14   . PAD (peripheral artery disease)   . Gangrene of toe 10/03/2014  . Sepsis due to other etiology 10/03/2014  . Chronic systolic heart failure 5/46/27    EF improved 50-55%  . Chronic systolic CHF (congestive heart  failure)     a. NICM - patent cors 06/2012, EF 40% at that time. b. 03/2013 eval: EF 20-25%.  . Chronic lower back pain   . Bladder cancer 03/15/2012    pt denies this hx on 10/15/2014   Past Surgical History:  Past Surgical History  Procedure Laterality Date  . Transthoracic echocardiogram  09-22-2010    MODERATE LVH/ EF 77%/ GRADE I DIASTOLIC DYSFUNCTION/ AORTIC SCLEROSIS WITHOUT STENOSIS/  RV  SYSTOLIC  MILDLY REDUCED FUNCTION  . Cardiovascular stress test  10-15-2010    LOW RISK NUCLEAR STUDY/ NO EVIDENCE  OF ISCHEMIA/ NORMAL EF  . Shoulder surgery  1970's    "don't remember which side or what kind of OR"  . Transurethral resection of bladder tumor  10/07/2011    Procedure: TRANSURETHRAL RESECTION OF BLADDER TUMOR (TURBT);  Surgeon: Malka So, MD;  Location: [facility];  Service: Urology;  Laterality: N/A;  . Cystoscopy  10/07/2011    Procedure: CYSTOSCOPY;  Surgeon: Malka So, MD;  Location: [facility];  Service: Urology;  Laterality: N/A;  . Cystoscopy with biopsy  03/14/2012    Procedure: CYSTOSCOPY WITH BIOPSY;  Surgeon: Malka So, MD;  Location: [facility];  Service: Urology;  Laterality: N/A;  WITH FULGURATION  . Transurethral resection of prostate  03/14/2012    Procedure: TRANSURETHRAL RESECTION OF THE PROSTATE WITH GYRUS INSTRUMENTS;  Surgeon: Malka So, MD;  Location: [facility];  Service: Urology;  Laterality: N/A;  . Tonsillectomy    . Cataract extraction w/ intraocular lens  implant, bilateral Bilateral   . Cardiac catheterization  05-11-2004  DR Soham PLAQUE/ NORMAL LVF/ EF 55%/  NON-OBSTRUCTIVE LAD 25%  . Cardiac catheterization  06/2012  . Eye surgery    . Amputation Left 10/16/2014    Procedure: AMPUTATION ABOVE KNEE- LEFT;  Surgeon: Mal Misty, MD;  Location: [facility] OR;  Service: Vascular;  Laterality: Left;    Assessment & Plan Clinical Impression: Scott [name] is a 81 y.o. male with history of HTN, BLE DVT/ PE, A fib- chronic coumadin, NICM, severe PVD with gangrenous changes left foot with recommendations for amputation due to non-salvageable limb. Patient elected to undergo L-AKA on 10/16/14 by Dr. Kellie Simmering. Post op with agitation due to delirium as well as ABLA.IV antibiotics d/c and coumadin resumed. Delirium has resolved and he was able to participate in PT/OT evaluations. Retention socks ordered by [employer].  Patient transferred to CIR on 10/18/2014 .   Patient currently requires total with mobility secondary to  muscle weakness, decreased cardiorespiratoy endurance, decreased coordination and decreased motor planning, decreased attention, decreased problem solving, decreased memory and delayed processing and decreased sitting balance, decreased standing balance, decreased postural control and decreased balance strategies.  Prior to hospitalization, patient was modified independent  with mobility and lived with Spouse in a House home.  Home access is  Ramped entrance.  Patient will benefit from skilled PT intervention to maximize safe functional mobility, minimize fall risk and decrease caregiver burden for planned discharge home with 24 hour supervision.  Anticipate patient will benefit from follow up [place] at discharge.  PT - End of Session Activity Tolerance: Tolerates < 10 min activity, no significant change in vital signs Endurance Deficit: Yes Endurance Deficit Description: cardiorespiratory PT Assessment Rehab Potential (ACUTE/IP ONLY): Good PT Patient demonstrates impairments in the following area(s): Balance;Pain;Edema;Safety;Endurance;Sensory;Skin Integrity;Motor PT Transfers Functional Problem(s): Bed Mobility;Bed to Chair;Car;Furniture PT Locomotion Functional Problem(s): Ambulation;Wheelchair Mobility;Other (  comment) (ramp) PT Plan PT Intensity: Minimum of 1-2 x/day ,45 to 90 minutes PT Frequency: 5 out of 7 days PT Duration Estimated Length of Stay: 2 weeks PT Treatment/Interventions: Ambulation/gait training;Cognitive remediation/compensation;Discharge planning;DME/adaptive equipment instruction;Functional mobility training;Pain management;Psychosocial support;Splinting/orthotics;Therapeutic Activities;UE/LE Strength taining/ROM;Balance/vestibular training;Community reintegration;Disease management/prevention;Neuromuscular re-education;Patient/family education;Skin care/wound management;Therapeutic Exercise;UE/LE Coordination activities;Wheelchair propulsion/positioning PT Transfers Anticipated  Outcome(s): supervision PT Locomotion Anticipated Outcome(s): mod I w/c propulsion; supervision short distance ambulation PT Recommendation Follow Up Recommendations: Home health PT;24 hour supervision/assistance Patient destination: Home Equipment Recommended: To be determined Equipment Details: Pt already owns manual w/c, SPC, 3 pronged cane (and walker in garage - son not sure which type - plans to bring it)  Skilled Therapeutic Intervention Treatment Session 1: PT Evaluation: PT initiates evaluation with son [name] present and notes pt with new L AKA due to gangrene. Pt presents with slowed cognitive and physical functioning, L residual limb weakness, generalized aches & pain due to prolonged positioning in bed, and difficulty with all aspects of functional mobility. PT initiated education re: phantom limb sensations and phantom limb pain - and how these contribute to falls risk.   Therapeutic Activity: PT instructs pt in rolling L in flat bed req min A, rolling R in flat bed req mod A, R side lie to sit transfer req max A, sit to stand req mod A, squat-pivot transfer bed to w/c to L with bedrail req max A.   Therapeutic Exercise: PT instructs pt in B LE ROM and strengthening exercises: R ankle pumps, R heel slides, R supine hip abduction/adduction, L SLR, L supine hip abduction/adduction: x 10 reps each.   W/C Management:  PT instructs pt in w/c propulsion with B UEs and R LE assist prn x 100' req mostly SBA, but min A when turning through door for w/c positioning.   Treatment Session 2: Therapeutic Activity: Pt received in bed with RN present, who reports pt was impulsively trying to transfer back to bed. PT agrees with RN that quick release belt needs to be in place when pt up in w/c. PT instructs pt in rolling R in bed req min-mod A, R side lie to sit transfer req max A (PT gives pt cues for hand placement, but he does not follow), and PT places slideboard and pt req mod A for  scoot/squat-pivot transfer bed to w/c to L.  PT instructs pt in w/c to bed transfer with use of Stedy req mod A to achieve standing.   Gait Training: PT instructs pt in ambulation with RW req max A sit to stand and mod A step-hop x 2', before pt unable to progress further, req +2 assist for w/c follow for safety.   Pt's son present entire firstsession and at end of 2nd session - asks PT many questions re: plan of care and prognosis and about equipment. PTanswers pt's questions to best of her ability. Pt will benefit from IPR PT - Continue per PT POC.     PT Evaluation Precautions/Restrictions Precautions Precautions: Fall Precaution Comments: contact Restrictions Weight Bearing Restrictions: Yes LLE Weight Bearing: Non weight bearing General Chart Reviewed: Yes Family/Caregiver Present: Yes (son [name]) Vital Signs Pain Pain Assessment Pain Assessment: 0-10 Pain Score: 4  Pain Type: Acute pain Pain Location: Hip Pain Orientation: Left Pain Descriptors / Indicators: Aching Pain Onset: Gradual Pain Intervention(s): Repositioned Multiple Pain Sites: Yes 2nd Pain Site Pain Score: 4 Pain Type: Acute pain Pain Location: Heel Pain Orientation: Right Pain Descriptors / Indicators: Dull Pain Onset: Gradual  Pain Intervention(s): Repositioned (floating heel on pillows)  Treatment Session 2: Pt c/o 2/10 headache - PT uses emotional support and distraction to decrease pt's pain.   Home Living/Prior Functioning Home Living Available Help at Discharge: Available 24 hours/day;Personal care attendant Type of Home: House Home Access: Ramped entrance Home Layout: One level Additional Comments: Hoyer lift as pt's wife has paraplegia - personal care attendant is assigned for pt's wife; Pt already owns [employer] and 3 point cane  Lives With: Spouse Prior Function Level of Independence: Requires assistive device for independence  Able to Take Stairs?: Yes (NA) Driving: No Vocation:  Retired Comments: ambulated with a cane in community PTA Vision/Perception    Pt wears glasses at baseline.  Cognition Arousal/Alertness: Lethargic Orientation Level: Oriented to person;Oriented to place;Disoriented to time;Oriented to situation Attention: Focused;Sustained Focused Attention: Appears intact Sustained Attention: Impaired Sustained Attention Impairment: Verbal complex;Verbal basic;Functional basic (possibly due to lethargy) Memory: Impaired Memory Impairment: Decreased recall of new information Sensation Sensation Light Touch: Impaired Detail Light Touch Impaired Details: Impaired RLE Stereognosis: Not tested Hot/Cold: Not tested Proprioception: Not tested Coordination Gross Motor Movements are Fluid and Coordinated: No Fine Motor Movements are Fluid and Coordinated: Not tested Coordination and Movement Description: slowed movements Motor  Motor Motor: Abnormal postural alignment and control Motor - Skilled Clinical Observations: slouched sitting posture  Mobility Bed Mobility Bed Mobility: Rolling Right;Rolling Left;Right Sidelying to Sit Rolling Right: 3: Mod assist Rolling Right Details: Manual facilitation for placement;Verbal cues for technique Rolling Left: 4: Min assist Rolling Left Details: Manual facilitation for placement;Verbal cues for technique Right Sidelying to Sit: 2: Max assist;HOB flat Right Sidelying to Sit Details: Manual facilitation for placement;Verbal cues for technique Right Sidelying to Sit Details (indicate cue type and reason): hand placement Transfers Transfers: Yes Sit to Stand: 3: Mod assist;From bed;With upper extremity assist Sit to Stand Details: Manual facilitation for placement;Verbal cues for technique Sit to Stand Details (indicate cue type and reason): lean forward, foot under middle of body Squat Pivot Transfers: 2: Max assist Squat Pivot Transfer Details: Manual facilitation for placement;Verbal cues for  technique;Verbal cues for precautions/safety;Verbal cues for sequencing Locomotion  Ambulation Ambulation: Yes Ambulation/Gait Assistance: 1: +2 Total assist Ambulation Distance (Feet): 2 Feet Assistive device: Rolling walker Ambulation/Gait Assistance Details: Manual facilitation for placement;Verbal cues for technique Gait Gait: Yes Gait Pattern: Impaired Gait Pattern: Step-to pattern;Poor foot clearance - right;Trunk flexed Stairs / Additional Locomotion Stairs: No Wheelchair Mobility Wheelchair Mobility: Yes Wheelchair Assistance: 4: Advertising account executive Details: Manual facilitation for placement Wheelchair Propulsion: Both upper extremities;Right lower extremity Wheelchair Parts Management: Needs assistance Distance: 100  Trunk/Postural Assessment  Cervical Assessment Cervical Assessment: Within Functional Limits Thoracic Assessment Thoracic Assessment: Within Functional Limits Lumbar Assessment Lumbar Assessment: Within Functional Limits Postural Control Postural Control: Deficits on evaluation Righting Reactions: delayed Protective Responses: delayed Postural Limitations: slouched sitting posture  Balance Balance Balance Assessed: Yes Static Sitting Balance Static Sitting - Balance Support: Bilateral upper extremity supported;Feet supported Static Sitting - Level of Assistance: 5: Stand by assistance Dynamic Sitting Balance Dynamic Sitting - Balance Support: Bilateral upper extremity supported;Feet supported Dynamic Sitting - Level of Assistance: 4: Min Insurance risk surveyor Standing - Balance Support: Bilateral upper extremity supported;During functional activity Static Standing - Level of Assistance: 3: Mod assist Dynamic Standing Balance Dynamic Standing - Balance Support: Bilateral upper extremity supported;During functional activity Dynamic Standing - Level of Assistance: 2: Max assist Extremity Assessment  RUE Assessment RUE  Assessment: Within Functional Limits  LUE Assessment LUE Assessment: Within Functional Limits RLE Assessment RLE Assessment: Within Functional Limits LLE Assessment LLE Assessment: Exceptions to WFL LLE AROM (degrees) Overall AROM Left Lower Extremity: Deficits;Due to precautions;Due to pain LLE Overall AROM Comments: hip flexion to 30 degrees due to pain; no knee/ankle present LLE Strength LLE Overall Strength: Deficits;Due to pain LLE Overall Strength Comments: hip flexion 2+/5, hip abduction/adduction 2/5, hip extension 2+/5  FIM:  FIM - Bed/Chair Transfer Bed/Chair Transfer Assistive Devices: Bed rails;Arm rests Bed/Chair Transfer: 2: Supine > Sit: Max A (lifting assist/Pt. 25-49%);2: Bed > Chair or W/C: Max A (lift and lower assist) FIM - Locomotion: Wheelchair Distance: 100 Locomotion: Wheelchair: 2: Travels 54 - 149 ft with minimal assistance (Pt.>75%)   Refer to Care Plan for Long Term Goals  Recommendations for other services: None  Discharge Criteria: Patient will be discharged from PT if patient refuses treatment 3 consecutive times without medical reason, if treatment goals not met, if there is a change in medical status, if patient makes no progress towards goals or if patient is discharged from hospital.  The above assessment, treatment plan, treatment alternatives and goals were discussed and mutually agreed upon: by patient and by family  [facility] M 10/19/2014, 12:30 PM

## 2014-10-19 NOTE — Plan of Care (Signed)
Problem: RH BOWEL ELIMINATION Goal: RH STG MANAGE BOWEL WITH ASSISTANCE STG Manage Bowel with min Assistance.  Outcome: Not Progressing Patients last BM on 10/15/14. adm

## 2014-10-19 NOTE — Plan of Care (Signed)
Problem: RH SAFETY Goal: RH STG ADHERE TO SAFETY PRECAUTIONS W/ASSISTANCE/DEVICE STG Adhere to Safety Precautions With min Assistance/Device.  Outcome: Not Progressing Patient confused and attempting to get out of bed. adm

## 2014-10-20 ENCOUNTER — Inpatient Hospital Stay (HOSPITAL_COMMUNITY): Payer: Medicare Other

## 2014-10-20 ENCOUNTER — Inpatient Hospital Stay (HOSPITAL_COMMUNITY): Payer: Medicare Other | Admitting: Occupational Therapy

## 2014-10-20 LAB — CBC
HCT: 25.8 % — ABNORMAL LOW (ref 39.0–52.0)
Hemoglobin: 8.4 g/dL — ABNORMAL LOW (ref 13.0–17.0)
MCH: 27.4 pg (ref 26.0–34.0)
MCHC: 32.6 g/dL (ref 30.0–36.0)
MCV: 84 fL (ref 78.0–100.0)
Platelets: 358 10*3/uL (ref 150–400)
RBC: 3.07 MIL/uL — ABNORMAL LOW (ref 4.22–5.81)
RDW: 14.8 % (ref 11.5–15.5)
WBC: 6.8 10*3/uL (ref 4.0–10.5)

## 2014-10-20 LAB — PROTIME-INR
INR: 2.23 — ABNORMAL HIGH (ref 0.00–1.49)
Prothrombin Time: 24.5 seconds — ABNORMAL HIGH (ref 11.6–15.2)

## 2014-10-20 MED ORDER — WARFARIN SODIUM 5 MG PO TABS
5.0000 mg | ORAL_TABLET | Freq: Once | ORAL | Status: AC
  Administered 2014-10-20: 5 mg via ORAL
  Filled 2014-10-20: qty 1

## 2014-10-20 MED ORDER — OXYCODONE HCL 5 MG PO TABS
2.5000 mg | ORAL_TABLET | Freq: Four times a day (QID) | ORAL | Status: DC | PRN
  Administered 2014-10-20 – 2014-11-03 (×19): 2.5 mg via ORAL
  Filled 2014-10-20 (×18): qty 1

## 2014-10-20 NOTE — Progress Notes (Signed)
Occupational Therapy Session Note  Patient Details  Name: Scott [name] MRN: [id number] Date of Birth: 1921-08-14  Today's Date: 10/20/2014 OT Individual Time:  -   1300-1400   (60 min)      Short Term Goals: Week 1:  OT Short Term Goal 1 (Week 1): Pt. will performing groooming at SBA OT Short Term Goal 2 (Week 1): Pt will perform bathing with min assist OT Short Term Goal 3 (Week 1): Pt will perform dressing with min assist OT Short Term Goal 4 (Week 1): Pt will perform troilet transfer with mod assist OT Short Term Goal 5 (Week 1): Pt will perform shower transfer with mod assist  Skilled Therapeutic Interventions/Progress Updates:    Engaged in transfer, sit to stand, toileting, and standing endurance.  Pt sitting in recliner with sitter, [name] present.  Transferrred pt from recliner to wc with max assist (SP).  Sat at sink and performed UB bathing and dressing.  Transferred wc>toilet>wc>recliner with max assist.  Sit to stand is max assist. Pt. Continent of bM.  Total assist with toileting.  Left with sitter and all needs in reach.   Therapy Documentation Precautions:  Precautions Precautions: Fall Precaution Comments: contact Restrictions Weight Bearing Restrictions: Yes LLE Weight Bearing: Non weight bearing    Pain:  none       :    See FIM for current functional status  Therapy/Group: Individual Therapy  Lisa Roca 10/20/2014, 8:04 AM

## 2014-10-20 NOTE — Progress Notes (Signed)
[place] PHYSICAL MEDICINE & REHABILITATION     PROGRESS NOTE    Subjective/Complaints: Had some confusion last night. Tends to have more issues at night.   ROS: Pt denies fever, rash/itching, headache, blurred or double vision, nausea, vomiting, abdominal pain, diarrhea, chest pain, shortness of breath, palpitations, dysuria, dizziness, neck or back pain, bleeding, anxiety, or depression   Objective: Vital Signs: Blood pressure 143/89, pulse 68, temperature 98.2 F (36.8 C), temperature source Oral, resp. rate 17, weight 65.499 kg (144 lb 6.4 oz), SpO2 99 %. No results found.  Recent Labs  10/19/14 0510 10/20/14 0500  WBC 7.1 6.8  HGB 7.6* 8.4*  HCT 23.8* 25.8*  PLT 317 358    Recent Labs  10/18/14 0308 10/19/14 0510  NA 139 136  K 4.2 4.1  CL 107 104  GLUCOSE 111* 92  BUN 21* 20  CREATININE 1.24 1.00  CALCIUM 9.0 8.8*   CBG (last 3)   Recent Labs  10/17/14 1631 10/17/14 2128 10/18/14 1114  GLUCAP 107* 102* 96    Wt Readings from Last 3 Encounters:  10/20/14 65.499 kg (144 lb 6.4 oz)  10/18/14 61.2 kg (134 lb 14.7 oz)  10/07/14 70.6 kg (155 lb 10.3 oz)    Physical Exam:  Constitutional: He is oriented to person, place, and time. He appears well-developed and well-nourished.  HENT: dentition poor. Mucosa fairly moist Head: Normocephalic and atraumatic.  Eyes: Conjunctivae are normal. Pupils are equal, round, and reactive to light.  Neck: Normal range of motion. Neck supple.  Cardiovascular: Normal rate and regular rhythm.no murmur Respiratory: Effort normal and breath sounds normal. No respiratory distress. He has no wheezes.  GI: Soft. Bowel sounds are normal. He exhibits no distension. There is no tenderness.  Musculoskeletal: He exhibits edema.  RLE with 1+edema ----stable to improved Neurological: He is alert and oriented to person, place, and time. Improved insight and awareness. UE 4/5 prox to distal. RLE: 3/5 hf, ke and 4/5 adf/apf,  LLE: 3/5 HF,  Skin: Skin is warm and dry. Incision c/d/i  Assessment/Plan: 1. Functional deficits secondary to left AKA which require 3+ hours per day of interdisciplinary therapy in a comprehensive inpatient rehab setting. Physiatrist is providing close team supervision and 24 hour management of active medical problems listed below. Physiatrist and rehab team continue to assess barriers to discharge/monitor patient progress toward functional and medical goals. FIM: FIM - Bathing Bathing: 0: Activity did not occur  FIM - Upper Body Dressing/Undressing Upper body dressing/undressing: 0: Activity did not occur FIM - Lower Body Dressing/Undressing Lower body dressing/undressing: 0: Wears gown/pajamas-no public clothing  FIM - Toileting Toileting: 0: Activity did not occur  FIM - Air cabin crew Transfers: 0-Activity did not occur  FIM - Control and instrumentation engineer Devices: Bed rails, Arm rests Bed/Chair Transfer: 2: Supine > Sit: Max A (lifting assist/Pt. 25-49%), 2: Bed > Chair or W/C: Max A (lift and lower assist)  FIM - Locomotion: Wheelchair Distance: 100 Locomotion: Wheelchair: 2: Travels 50 - 149 ft with minimal assistance (Pt.>75%) FIM - Locomotion: Ambulation Ambulation/Gait Assistance: 1: +2 Total assist Locomotion: Ambulation: 1: Two helpers  Comprehension Comprehension Mode: Auditory Comprehension: 4-Understands basic 75 - 89% of the time/requires cueing 10 - 24% of the time  Expression Expression Mode: Verbal Expression: 3-Expresses basic 50 - 74% of the time/requires cueing 25 - 50% of the time. Needs to repeat parts of sentences.  Social Interaction Social Interaction: 3-Interacts appropriately 50 - 74% of the time - May  be physically or verbally inappropriate.  Problem Solving Problem Solving: 3-Solves basic 50 - 74% of the time/requires cueing 25 - 49% of the time  Memory Memory: 3-Recognizes or recalls 50 - 74% of the  time/requires cueing 25 - 49% of the time   Medical Problem List and Plan: 1. Functional deficits secondary to L-AKA due to severe PVD with gangrenous changes.  2. H/o PE/DVT/Anticoagulation: Pharmaceutical: Coumadin 3. Pain Management: limit oxycodone due mental status 4. Mood: LCSW to follow for evaluation and support.  5. Neuropsych: This patient is not capable of making decisions on his own behalf. 6. Skin/Wound Care: Monitor wound daily for healing. Routine pressure relief measures.   -stump sock 7. Fluids/Electrolytes/Nutrition: encourage PO intake.  8. ABLA:  iron supplement added. hgb back tup to 8.4 today---no signs of bleeding 9. Non-obstructive CAD: On BB and statin.  10. Dementia: Continue Aricept. Delirium resolved---need with narcotics/neurosedating meds 11. CKD stage IV: Improved with hydration. Monitor for now.  12. Combined systolic and diastolic CHF: Monitoring daily weights for signs of overload. On BB, nitrates and statin. Lasix Currently on hold.does not appear volume overloaded at present 13. BPH- On Flomax.   LOS (Days) 2 A FACE TO FACE EVALUATION WAS PERFORMED  Scott [name] T 10/20/2014 8:38 AM

## 2014-10-20 NOTE — Progress Notes (Signed)
ANTICOAGULATION CONSULT NOTE - Follow Up Consult  Pharmacy Consult for Warfarin Indication: Hx PE/DVT  Allergies  Allergen Reactions  . Lipitor [Atorvastatin] Other (See Comments)    unknown    Patient Measurements: Weight: 144 lb 6.4 oz (65.499 kg)  Vital Signs: Temp: 98.2 F (36.8 C) (07/10 0504) Temp Source: Oral (07/10 0504) BP: 143/89 mmHg (07/10 0504) Pulse Rate: 68 (07/10 0504)  Labs:  Recent Labs  10/17/14 0830  10/18/14 0308 10/19/14 0510 10/20/14 0500  HGB  --   < > 8.2* 7.6* 8.4*  HCT  --   --  25.3* 23.8* 25.8*  PLT  --   --  [phone]  LABPROT  --   --  25.3* 25.2* 24.5*  INR  --   --  2.33* 2.32* 2.23*  CREATININE 1.02  --  1.24 1.00  --   < > = values in this interval not displayed.  Estimated Creatinine Clearance: 44.6 mL/min (by C-G formula based on Cr of 1).   Assessment: 23 YOM who continues on warfarin for hx DVT/PE - held for AKA on 7/6 and resumed post-op. INR this morning remains therapeutic though trending down slightly (INR 2.23 << 2.32, goal of 2-3). Hgb 8.4 << 7.6, plts wnl - no overt s/sx of bleeding noted at this time but will continue to monitor given recent AKA and risk of bleeding from stump site. Will try 5 mg again today and attempt to get back on the patient's home schedule starting on 7/11.   PTA dose 5 mg daily EXCEPT for 7.5 mg on Mon/Fri. The patient has been educated during this admission.    Goal of Therapy:  INR 2-3   Plan:  1. Warfarin 5 mg x 1 dose at 1800 today 2. Will continue to monitor for any signs/symptoms of bleeding and will follow up with PT/INR in the a.m.   Alycia Rossetti, PharmD, BCPS Clinical Pharmacist Pager: [phone] 10/20/2014 7:57 AM

## 2014-10-20 NOTE — Plan of Care (Signed)
Problem: RH BOWEL ELIMINATION Goal: RH STG MANAGE BOWEL WITH ASSISTANCE STG Manage Bowel with min Assistance.  Outcome: Not Progressing Patients last BM 10/15/14. Will attempt to give Dulcolax suppository during shift. adm

## 2014-10-20 NOTE — Progress Notes (Addendum)
Physical Therapy Session Note  Patient Details  Name: Scott [name] MRN: [id number] Date of Birth: 11-Jul-1921  Today's Date: 10/20/2014 PT Individual Time: 0903-1005; 3893-7342 PT Individual Time Calculation (min): 62 min , 35 min  Short Term Goals: Week 1:  PT Short Term Goal 1 (Week 1): Pt will demonstrate supine to sit bed mobility req min A.  PT Short Term Goal 2 (Week 1): Pt will demonstrate squat-pivot transfers req min A.  PT Short Term Goal 3 (Week 1): Pt will propel manual w/c >= 150' req SBA.  PT Short Term Goal 4 (Week 1): Pt will ambulate 25' req min A.  PT Short Term Goal 5 (Week 1): Pt will demonstrate rolling in bed with SBA/verbal cues.   Skilled Therapeutic Interventions/Progress Updates:  tx 1 focused on bed mobility, components of transfer, w/c propulsion  Bed mobility with extra time, VCS, close supervision.  Sit> stand with mod assist, tactile and VCS.  Sit>< stand x 3 with mod assist to stand, max assist to sit, including during donning of brief and PJ pants.  Pt unable to remove hands from RW to reach back during stand>sit. Stand pivot transfer with RW, mod assist plus 2nd person to steady w/c. Pt able to tolerate standing x 1 minute.  W/c propulsion x 150' using bil UEs with supervision. Locking/unlocking brakes with VCs.  Sit> stand in // with bil hands pulling up on bars, x 1 minute during L hip ext x 10.  neuromuscular re-education via demo, VCS, manual cues for forward wt shifting during transfers, 10 x 1 bil hip add, glut sets, and R long arc quad knee ext with ankle pumps at end range for active heel cord stretch in sitting;  pelvic dissociation for scooting forward/backward on bed and w/c    PT returned pt to room, quick release belt applied.  Family caregiver Scott [name] in room, and all needs wtihin reach.  tx 2 focused on therapeutic exercise performed with LEs to increase strength for functional mobility, and sit>< stand in [place], and transfer using Stedy.  Pt  performed seated 10 x 1 R long arc quad knee ext with ankle pumps at end range, R calf raises, 10 x 2 bil hip adduction against resistance of towel roll, for core activation.  Recliner> Stedy required mod/max assist to come to standing.  From raised seat of Stedy, pt performed 5 sit>< stand pulling up with bil hands, with rest breaks between, standing for 20 seconds - 1 minute each time.  Assistance decreased with each rep, mod/max> close supervision for last rep.  VCs for upright trunk and forward gaze in sitting and standing.  Son Scott [name] present, with many questions as to pt's prognosis, privately, with this PT.  PT referred pt to PA and/or MD.   Therapy Documentation Precautions:  Precautions Precautions: Fall Precaution Comments: contact Restrictions Weight Bearing Restrictions: Yes LLE Weight Bearing: Non weight bearing   Pain: Pain Assessment Pain Assessment: No/denies pain   Locomotion : Wheelchair Mobility Distance: 150     See FIM for current functional status  Therapy/Group: Individual Therapy  Revan Gendron 10/20/2014, 10:30 AM

## 2014-10-20 NOTE — Plan of Care (Signed)
Problem: RH SAFETY Goal: RH STG ADHERE TO SAFETY PRECAUTIONS W/ASSISTANCE/DEVICE STG Adhere to Safety Precautions With min Assistance/Device.  Outcome: Not Progressing Patient confused with short term memory deficits at HS,  attempting to get out of bed. Patient educated on safety and risks of falling.adm

## 2014-10-21 ENCOUNTER — Inpatient Hospital Stay (HOSPITAL_COMMUNITY): Payer: Medicare Other | Admitting: Occupational Therapy

## 2014-10-21 ENCOUNTER — Telehealth: Payer: Self-pay | Admitting: Vascular Surgery

## 2014-10-21 ENCOUNTER — Inpatient Hospital Stay (HOSPITAL_COMMUNITY): Payer: Medicare Other

## 2014-10-21 LAB — PROTIME-INR
INR: 2.41 — ABNORMAL HIGH (ref 0.00–1.49)
Prothrombin Time: 26 seconds — ABNORMAL HIGH (ref 11.6–15.2)

## 2014-10-21 MED ORDER — FUROSEMIDE 20 MG PO TABS
20.0000 mg | ORAL_TABLET | Freq: Every day | ORAL | Status: DC
  Administered 2014-10-21 – 2014-10-31 (×11): 20 mg via ORAL
  Filled 2014-10-21 (×13): qty 1

## 2014-10-21 MED ORDER — WARFARIN SODIUM 5 MG PO TABS
5.0000 mg | ORAL_TABLET | Freq: Once | ORAL | Status: AC
  Administered 2014-10-21: 5 mg via ORAL
  Filled 2014-10-21: qty 1

## 2014-10-21 MED ORDER — HYDROCERIN EX CREA
TOPICAL_CREAM | Freq: Two times a day (BID) | CUTANEOUS | Status: DC
  Administered 2014-10-21 – 2014-11-02 (×24): via TOPICAL
  Administered 2014-11-02: 1 via TOPICAL
  Administered 2014-11-03 – 2014-11-04 (×2): via TOPICAL
  Filled 2014-10-21: qty 113

## 2014-10-21 NOTE — Progress Notes (Signed)
Meredith Staggers, MD Physician Signed Physical Medicine and Rehabilitation Consult Note 10/17/2014 8:14 AM  Related encounter: Admission (Discharged) from 10/15/2014 in [facility]        Physical Medicine and Rehabilitation Consult Reason for Consult: L-BKA Referring Physician: Dr. Kellie Simmering   HPI: Scott [name] is a 81 y.o. male with history of HTN, BLE DVT/ PE, A fib- chronic coumadin, NICM, severe PVD with gangrenous changes left foot with recommendations for amputation due to non-salvageable limb. Patient elected to undergo L-AKA on 10/16/14 by Dr. Kellie Simmering. Post op with agitation due to delirium as well as ABLA. PT/OT evaluations to be done today. CIR recommended due to anticipated rehab needs.    Review of Systems  Unable to perform ROS: mental acuity      Past Medical History  Diagnosis Date  . HTN (hypertension)   . Hyperlipidemia   . PVCs (premature ventricular contractions)   . Glaucoma   . PVD (peripheral vascular disease)     Right ABI .75, Left .78 (2006)  . Popliteal artery aneurysm, bilateral DECUMENTED CHRONIC PARTIAL OCCLUSION-- PT DENIES CLAUDICATION OR ANY OTHER SYMPTOMS  . Frequency of urination   . Nocturia   . DVT, bilateral lower limbs 03/2013    a. Bilat DVT dx 03/2013.  . Pulmonary embolism     a. By CT angio 04/2013.  Marland Kitchen Elevated troponin     a. Adm 12/30-04/2013 - not felt to represent ACS; ?due to PE. b. Patent cors 06/2012.  Marland Kitchen NSVT (nonsustained ventricular tachycardia)     a. NSVT 06/2012; NSVT also seen during 03/2013 adm. b. Med rx. Not candidate for ICD given adv age.  Marland Kitchen ETOH abuse   . Syncope     a. Felt to be postural syncope related to diuretics 06/2012.  Marland Kitchen Heart murmur   . DVT (deep venous thrombosis)     "I think in both legs"  . Pneumonia     "couple times"  . DDD (degenerative disc disease)     . Arthritis     "all over"  . Depression   . CAD- non obstructive disease by cath 3/14   . PAD (peripheral artery disease)   . Gangrene of toe 10/03/2014  . Sepsis due to other etiology 10/03/2014  . Chronic systolic heart failure 5/85/27    EF improved 50-55%  . Chronic systolic CHF (congestive heart failure)     a. NICM - patent cors 06/2012, EF 40% at that time. b. 03/2013 eval: EF 20-25%.  . Chronic lower back pain   . Bladder cancer 03/15/2012    pt denies this hx on 10/15/2014    Past Surgical History  Procedure Laterality Date  . Transthoracic echocardiogram  09-22-2010    MODERATE LVH/ EF 78%/ GRADE I DIASTOLIC DYSFUNCTION/ AORTIC SCLEROSIS WITHOUT STENOSIS/ RV SYSTOLIC MILDLY REDUCED FUNCTION  . Cardiovascular stress test  10-15-2010    LOW RISK NUCLEAR STUDY/ NO EVIDENCE OF ISCHEMIA/ NORMAL EF  . Shoulder surgery  1970's    "don't remember which side or what kind of OR"  . Transurethral resection of bladder tumor  10/07/2011    Procedure: TRANSURETHRAL RESECTION OF BLADDER TUMOR (TURBT); Surgeon: Malka So, MD; Location: [facility]; Service: Urology; Laterality: N/A;  . Cystoscopy  10/07/2011    Procedure: CYSTOSCOPY; Surgeon: Malka So, MD; Location: [facility]; Service: Urology; Laterality: N/A;  . Cystoscopy with biopsy  03/14/2012    Procedure: CYSTOSCOPY WITH BIOPSY; Surgeon: Malka So, MD; Location: [facility]; Service: Urology; Laterality: N/A; WITH FULGURATION  . Transurethral resection of prostate  03/14/2012    Procedure: TRANSURETHRAL RESECTION OF THE PROSTATE WITH GYRUS INSTRUMENTS; Surgeon: Malka So, MD; Location: [facility]; Service: Urology; Laterality: N/A;  . Tonsillectomy    . Cataract extraction w/ intraocular lens implant, bilateral Bilateral   . Cardiac catheterization  05-11-2004 [facility] [place] PLAQUE/ NORMAL LVF/ EF 55%/ NON-OBSTRUCTIVE LAD 25%  . Cardiac catheterization  06/2012  . Eye surgery      Family History  Problem Relation Age of Onset  . Hypertension Mother   . Arthritis Mother   . Lung cancer Sister   . Deep vein thrombosis Father   . Early death Brother     Social History:  reports that he quit smoking about 39 years ago. His smoking use included Cigarettes. He started smoking about 72 years ago. He has a 20 pack-year smoking history. He has never used smokeless tobacco. He reports that he does not drink alcohol or use illicit drugs.     Allergies  Allergen Reactions  . Lipitor [Atorvastatin] Other (See Comments)    unknown   Medications Prior to Admission  Medication Sig Dispense Refill  . amoxicillin (AMOXIL) 500 MG capsule Take 1 capsule (500 mg total) by mouth every 12 (twelve) hours. Antibiotic to be taken for 7 more days 14 capsule 0  . donepezil (ARICEPT) 10 MG tablet TAKE ONE TABLET AT BEDTIME 30 tablet 2  . enoxaparin (LOVENOX) 80 MG/0.8ML injection Inject 0.8 mLs (80 mg total) into the skin every 12 (twelve) hours. 7 Syringe 0  . fluticasone (FLONASE) 50 MCG/ACT nasal spray Place 2 sprays into both nostrils daily. 16 g 6  . furosemide (LASIX) 40 MG tablet 40 MG ON MON, WED, AND FRI'S; THEN 20 MG ALL OTHER DAYS 30 tablet 5  . gabapentin (NEURONTIN) 100 MG capsule TAKE (2) CAPSULES TWICE DAILY. 120 capsule 0  . HORSE CHESTNUT PO Take 1 capsule by mouth daily.    Marland Kitchen HYDROcodone-acetaminophen (NORCO/VICODIN) 5-325 MG per tablet Take 1/2 to a whole tab QID PRN for severe pain 60 tablet 0  . isosorbide mononitrate (IMDUR) 30 MG 24 hr tablet Take 0.5 tablets (15 mg total) by mouth daily. 30 tablet 5  . lidocaine (XYLOCAINE) 2 % jelly APPLY AS NEEDED TO THE LEFT FOOD ULCER  2  . megestrol (MEGACE) 20 MG tablet TAKE 1 TABLET ONCE A  DAY 30 tablet 3  . metoprolol succinate (TOPROL-XL) 50 MG 24 hr tablet TAKE (1) TABLET TWICE A DAY. 60 tablet 0  . nitroGLYCERIN (NITROSTAT) 0.4 MG SL tablet Place 1 tablet (0.4 mg total) under the tongue every 5 (five) minutes as needed for chest pain (up to 3 doses). 25 tablet 3  . potassium chloride SA (K-DUR,KLOR-CON) 20 MEQ tablet Take 1 tablet (20 mEq total) by mouth daily. 90 tablet 1  . simvastatin (ZOCOR) 10 MG tablet TAKE 1 TABLET DAILY 30 tablet 4  . tamsulosin (FLOMAX) 0.4 MG CAPS capsule TAKE (1) CAPSULE DAILY 30 capsule 5  . ULORIC 40 MG tablet TAKE 1 TABLET DAILY 30 tablet 4  . warfarin (COUMADIN) 2.5 MG tablet Take two tablets each evening until further notified by the physician who is monitoring your blood work.      Home: Home Living Family/patient expects to be discharged to:: Skilled nursing facility Living Arrangements: Spouse/significant  other, Other (Comment) ("24h caretakers")  Functional History:   Functional Status:  Mobility:          ADL:    Cognition: Cognition Orientation Level: Oriented to person, Oriented to place, Oriented to time, Disoriented to situation    Blood pressure 126/45, pulse 93, temperature 97.3 F (36.3 C), temperature source Oral, resp. rate 17, weight 61.1 kg (134 lb 11.2 oz), SpO2 92 %. Physical Exam  Nursing note and vitals reviewed. Constitutional: He appears well-developed. He is sleeping. He is easily aroused.  Frail ill appearing elderly male with bilateral mittens and sitter in the room.  HENT:  Head: Normocephalic and atraumatic.  Eyes: Pupils are equal, round, and reactive to light.  Neck: Normal range of motion. Neck supple.  Cardiovascular: Normal rate. An irregular rhythm present.  Respiratory: Effort normal and breath sounds normal. No respiratory distress. He has no wheezes.  GI: Soft. Bowel sounds are normal. He exhibits no distension. There is no tenderness.   Musculoskeletal: He exhibits no edema.  L-AKA with dressing and drain in place.  Neurological: He is easily aroused.  Sleeping soundly and aroused briefly--didn't fall asleep till early am per sitter. Speech severely dysarthric. Confused and oriented to self only. Withdraws BUE to pain. RLE limited ROM due to knee stiffness/arthritis.  Skin: Skin is warm and dry.  Incision clean/intact with minimal drainage at present  Psychiatric: His speech is slurred. He is slowed. He exhibits abnormal recent memory.     Lab Results Last 24 Hours    Results for orders placed or performed during the hospital encounter of 10/15/14 (from the past 24 hour(s))  Glucose, capillary Status: None   Collection Time: 10/16/14 11:42 AM  Result Value Ref Range   Glucose-Capillary 97 65 - 99 mg/dL  Glucose, capillary Status: Abnormal   Collection Time: 10/16/14 10:02 PM  Result Value Ref Range   Glucose-Capillary 109 (H) 65 - 99 mg/dL   Comment 1 Notify RN    Comment 2 Document in Chart   CBC Status: Abnormal   Collection Time: 10/17/14 3:28 AM  Result Value Ref Range   WBC 11.9 (H) 4.0 - 10.5 K/uL   RBC 3.01 (L) 4.22 - 5.81 MIL/uL   Hemoglobin 8.1 (L) 13.0 - 17.0 g/dL   HCT 25.1 (L) 39.0 - 52.0 %   MCV 83.4 78.0 - 100.0 fL   MCH 26.9 26.0 - 34.0 pg   MCHC 32.3 30.0 - 36.0 g/dL   RDW 15.0 11.5 - 15.5 %   Platelets 339 150 - 400 K/uL  Protime-INR Status: Abnormal   Collection Time: 10/17/14 3:28 AM  Result Value Ref Range   Prothrombin Time 22.1 (H) 11.6 - 15.2 seconds   INR 1.94 (H) 0.00 - 1.49  Glucose, capillary Status: None   Collection Time: 10/17/14 6:37 AM  Result Value Ref Range   Glucose-Capillary 80 65 - 99 mg/dL      Imaging Results (Last 48 hours)    Dg Chest Port 1 View  10/15/2014 CLINICAL DATA: Pre-op for left-sided above the knee amputation. History of  hypertension and cardiac catheterization. Initial encounter. EXAM: PORTABLE CHEST - 1 VIEW COMPARISON: 10/05/2014 and 10/03/2014 radiographs. CT 04/10/2013. FINDINGS: 1638 hours. The heart size and mediastinal contours are stable. There is aortic atherosclerosis with stable ectasia of the right brachiocephalic artery. There is interval improved aeration of the lungs which are now clear. There is some residual blunting of the right costophrenic angle which may reflect a small right-sided pleural  effusion. The bones appear unremarkable. IMPRESSION: Interval improved aeration of the lungs with possible small residual right pleural effusion. No acute findings. Electronically Signed By: Richardean Sale M.D. On: 10/15/2014 16:47     Assessment/Plan: Diagnosis: left AKA pod #1 1. Does the need for close, 24 hr/day medical supervision in concert with the patient's rehab needs make it unreasonable for this patient to be served in a less intensive setting? Potentially 2. Co-Morbidities requiring supervision/potential complications: htn, cad, hx dvt, chf 3. Due to bladder management, bowel management, safety, skin/wound care, disease management, medication administration, pain management and patient education, does the patient require 24 hr/day rehab nursing? Potentially 4. Does the patient require coordinated care of a physician, rehab nurse, PT (1-2 hrs/day, 5 days/week) and OT (1-2 hrs/day, 5 days/week) to address physical and functional deficits in the context of the above medical diagnosis(es)? Yes Addressing deficits in the following areas: balance, endurance, locomotion, strength, transferring, bowel/bladder control, bathing, dressing, feeding, grooming and toileting 5. Can the patient actively participate in an intensive therapy program of at least 3 hrs of therapy per day at least 5 days per week? Potentially 6. The potential for patient to make measurable gains while on inpatient rehab is good  and fair 7. Anticipated functional outcomes upon discharge from inpatient rehab are supervision and min assist with PT, supervision and min assist with OT, n/a with SLP. 8. Estimated rehab length of stay to reach the above functional goals is: potentially 12-18 days 9. Does the patient have adequate social supports and living environment to accommodate these discharge functional goals? Potentially 10. Anticipated D/C setting: Home 11. Anticipated post D/C treatments: [place] therapy 12. Overall Rehab/Functional Prognosis: good  RECOMMENDATIONS: This patient's condition is appropriate for continued rehabilitative care in the following setting: see below Patient has agreed to participate in recommended program. Potentially Note that insurance prior authorization may be required for reimbursement for recommended care.  Comment: Pt states his "paraplegic" wife is at home with him in addition to his son who is "in and out"--?works. Son would need to be available for assist for [place] to consider CIR. Rehab Admissions Coordinator to follow up.  Thanks,  Meredith Staggers, MD, Mellody Drown     10/17/2014       Revision History     Date/Time User Provider Type Action   10/17/2014 11:57 AM Meredith Staggers, MD Physician Sign   10/17/2014 8:32 AM Bary Leriche, PA-C Physician Assistant Share   View Details Report       Routing History     Date/Time From To Method   10/17/2014 11:57 AM Meredith Staggers, MD Meredith Staggers, MD In Basket   10/17/2014 11:57 AM Meredith Staggers, MD Chipper Herb, MD In Basket

## 2014-10-21 NOTE — Telephone Encounter (Addendum)
-----   Message from Mena Goes, RN sent at 10/18/2014  9:51 AM EDT ----- Regarding: Schedule   ----- Message -----    From: Gabriel Earing, PA-C    Sent: 10/18/2014   7:29 AM      To: [facility] Charge Pool  S/p left AKA 10/16/14.  F/u with Dr. Kellie Simmering in 4 weeks.  Thanks, Aldona Bar  notified patient's wife of post op appt. on 11-19-14  12:45

## 2014-10-21 NOTE — IPOC Note (Signed)
Overall Plan of Care [facility]) Patient Details Name: Scott [name] MRN: [id number] DOB: Sep 02, 1921  Admitting Diagnosis: L AKA  Hospital Problems: Principal Problem:   Status post above knee amputation of left lower extremity Active Problems:   Benign essential HTN   Chronic diastolic heart failure, NYHA class 1   Atrial fibrillation with RVR     Functional Problem List: Nursing Bladder, Bowel, Edema, Endurance, Medication Management, Nutrition, Pain, Safety, Sensory, Skin Integrity  PT Balance, Pain, Edema, Safety, Endurance, Sensory, Skin Integrity, Motor  OT Balance, Endurance, Motor, Safety, Pain  SLP    TR         Basic ADL's: OT Eating, Grooming, Bathing, Dressing, Toileting     Advanced  ADL's: OT       Transfers: PT Bed Mobility, Bed to Chair, Car, Manufacturing systems engineer, Metallurgist: PT Ambulation, Emergency planning/management officer, Other (comment) (ramp)     Additional Impairments: OT Fuctional Use of Upper Extremity  SLP        TR      Anticipated Outcomes Item Anticipated Outcome  Self Feeding independent  Swallowing      Basic self-care  minimal assist  Toileting  minimal assist   Bathroom Transfers minimal assist  Bowel/Bladder  manage bowel and bladder minimal assist  Transfers  supervision  Locomotion  mod I w/c propulsion; supervision short distance ambulation  Communication     Cognition     Pain  3 or less  Safety/Judgment  minimal assist   Therapy Plan: PT Intensity: Minimum of 1-2 x/day ,45 to 90 minutes PT Frequency: 5 out of 7 days PT Duration Estimated Length of Stay: 2 weeks OT Intensity: Minimum of 1-2 x/day, 45 to 90 minutes OT Frequency: 5 out of 7 days OT Duration/Estimated Length of Stay: 14 days         Team Interventions: Nursing Interventions Medication Management, Patient/Family Education, Bladder Management, Bowel Management, Disease Management/Prevention, Pain Management, Skin Care/Wound Management  PT  interventions Ambulation/gait training, Cognitive remediation/compensation, Discharge planning, DME/adaptive equipment instruction, Functional mobility training, Pain management, Psychosocial support, Splinting/orthotics, Therapeutic Activities, UE/LE Strength taining/ROM, Training and development officer, Community reintegration, Disease management/prevention, Neuromuscular re-education, Patient/family education, Skin care/wound management, Therapeutic Exercise, UE/LE Coordination activities, Wheelchair propulsion/positioning  OT Interventions Training and development officer, Discharge planning, DME/adaptive equipment instruction, Functional mobility training, Pain management, Patient/family education, Self Care/advanced ADL retraining, Therapeutic Activities, Therapeutic Exercise, UE/LE Strength taining/ROM, UE/LE Coordination activities, Wheelchair propulsion/positioning  SLP Interventions    TR Interventions    SW/CM Interventions      Team Discharge Planning: Destination: PT-Home ,OT- Home , SLP-  Projected Follow-up: PT-Home health PT, 24 hour supervision/assistance, OT-  Home health OT, SLP-  Projected Equipment Needs: PT-To be determined, OT- Standard walker, Tub/shower seat (has builet in shower seat), SLP-  Equipment Details: PT-Pt already owns manual w/c, SPC, 3 pronged cane (and walker in garage - son not sure which type - plans to bring it), OT-  Patient/family involved in discharge planning: PT- Patient, Family member/caregiver,  OT-Family Midwife, Patient, SLP-   MD ELOS: 12-15d Medical Rehab Prognosis:  Good Assessment: 82 y.o. male with history of HTN, BLE DVT/ PE, A fib- chronic coumadin, NICM, severe PVD with gangrenous changes left foot with recommendations for amputation due to non-salvageable limb. Patient elected to undergo L-AKA on 10/16/14 by Dr. Kellie Simmering   Now requiring 24/7 Rehab RN,MD, as well as CIR level PT, OT.  Treatment team will focus on ADLs and mobility with goals  set at  [facility]  See Team Conference Notes for weekly updates to the plan of care

## 2014-10-21 NOTE — Progress Notes (Signed)
Physical Therapy Session Note  Patient Details  Name: Scott [name] MRN: [id number] Date of Birth: 24-Jun-1921  Today's Date: 10/21/2014 PT Individual Time: 1100-1155 PT Individual Time Calculation (min): 55 min   Short Term Goals: Week 1:  PT Short Term Goal 1 (Week 1): Pt will demonstrate supine to sit bed mobility req min A.  PT Short Term Goal 2 (Week 1): Pt will demonstrate squat-pivot transfers req min A.  PT Short Term Goal 3 (Week 1): Pt will propel manual w/c >= 150' req SBA.  PT Short Term Goal 4 (Week 1): Pt will ambulate 25' req min A.  PT Short Term Goal 5 (Week 1): Pt will demonstrate rolling in bed with SBA/verbal cues.   Skilled Therapeutic Interventions/Progress Updates:    Session focused on functional w/c propulsion for mobility training and overall strengthening and endurance down to therapy gym and then back to room at end of session with S for cues for technique and encouragement with extra time needed, squat pivot transfer from mat to w/c with cues for technique and initiation with overall steady A, sit to stands and dynamic standing balance while performing functional reaching task with min A for balance and mod A for sit to stands (cues for hand placement and anterior weightshift), and stand pivot transfer initiated with attempts for pt to forward hop to prepare for gait training but pt requires +2 for safety as pt unable to complete full pivot and requires assist for descent in to w/c. Pt requires rest breaks due to fatigue during session and extra time to complete all tasks. End of session left up in w/c with all needs in reach and safety belt donned.   Therapy Documentation Precautions:  Precautions Precautions: Fall Precaution Comments: contact Restrictions Weight Bearing Restrictions: Yes LLE Weight Bearing: Non weight bearing  Pain: Reports "just a little" pain in residual limb. Repositioned and rest breaks as needed.   See FIM for current functional  status  Therapy/Group: Individual Therapy  Canary Brim Ivory Broad, PT, DPT  10/21/2014, 12:04 PM

## 2014-10-21 NOTE — Progress Notes (Signed)
Patient information reviewed and entered into [facility] system by Othel Dicostanzo, RN, CRRN, PPS Coordinator.  Information including medical coding and functional independence measure will be reviewed and updated through discharge.     Per nursing patient was given "Data Collection Information Summary for Patients in Inpatient Rehabilitation Facilities with attached "Privacy Act Statement-Health Care Records" upon admission.  

## 2014-10-21 NOTE — Progress Notes (Signed)
Physical Therapy Session Note  Patient Details  Name: Scott [name] MRN: [id number] Date of Birth: Nov 05, 1921  Today's Date: 10/21/2014 PT Individual Time: 0800-0900 PT Individual Time Calculation (min): 60 min   Short Term Goals: Week 1:  PT Short Term Goal 1 (Week 1): Pt will demonstrate supine to sit bed mobility req min A.  PT Short Term Goal 2 (Week 1): Pt will demonstrate squat-pivot transfers req min A.  PT Short Term Goal 3 (Week 1): Pt will propel manual w/c >= 150' req SBA.  PT Short Term Goal 4 (Week 1): Pt will ambulate 25' req min A.  PT Short Term Goal 5 (Week 1): Pt will demonstrate rolling in bed with SBA/verbal cues.   Skilled Therapeutic Interventions/Progress Updates:   Pt asleep and took multi modal cues for arousal. Pt's family "sitter" present in room and discussed with her and pt about scheduling (spacing out therapies) and option or decreased therapy if unable to tolerate full 3 hours per day. Pt verbalized understanding. Focused on functional bed mobility, sitting balance EOB to eat breakfast, transfers using slideboard for OOB and sit to stand with RW. Pt with incontinent urine episode while seated EOB eating breakfast requiring mod A for lateral leans and weightshifting to scoot to change linens, gown, and place pad under patient to prepare for transfer OOB. Performed slideboard transfer with max A (+2 present for safety) with cues for technique and motor planning. Sit to stands with mod to max A x 2 reps with RW from w/c with cues for hand placement and facilitation for forward weightshift and upright posture (pt maintains flexed posture in standing). While in standing working on tolerance and posture, rehab tech changed out pad and donned brief. End of session left up in w/c with family sitter in room and quick release belt donned. All needs in reach.   Therapy Documentation Precautions:  Precautions Precautions: Fall Precaution Comments:  contact Restrictions Weight Bearing Restrictions: Yes LLE Weight Bearing: Non weight bearing  Pain:  Denies pain.   See FIM for current functional status  Therapy/Group: Individual Therapy  Canary Brim Ivory Broad, PT, DPT  10/21/2014, 9:05 AM

## 2014-10-21 NOTE — Progress Notes (Signed)
ANTICOAGULATION CONSULT NOTE - Follow Up Consult  Pharmacy Consult for Warfarin Indication: Hx PE/DVT  Allergies  Allergen Reactions  . Lipitor [Atorvastatin] Other (See Comments)    unknown    Patient Measurements: Weight: 146 lb 11.2 oz (66.543 kg)  Vital Signs: Temp: 98.6 F (37 C) (07/11 0510) Temp Source: Oral (07/11 0510) BP: 110/52 mmHg (07/11 0919) Pulse Rate: 60 (07/11 0919)  Labs:  Recent Labs  10/19/14 0510 10/20/14 0500 10/21/14 0520  HGB 7.6* 8.4*  --   HCT 23.8* 25.8*  --   PLT 317 358  --   LABPROT 25.2* 24.5* 26.0*  INR 2.32* 2.23* 2.41*  CREATININE 1.00  --   --     Estimated Creatinine Clearance: 45.3 mL/min (by C-G formula based on Cr of 1).   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 INR this morning is 2.41, remains therapeutic. Goal of 2-3. Yesterday Hgb 8.4 << 7.6, plts wnl. No CBC today. No overt s/sx of bleeding noted at this time but will continue to monitor given recent AKA and risk of bleeding from stump site. Will try 5 mg again today and attempt to get back on the patient's home schedule starting on 7/11.   PTA dose 5 mg daily EXCEPT for 7.5 mg on Mon/Fri. The patient has been educated during this admission.    Goal of Therapy:  INR 2-3   Plan:  1. Warfarin 5 mg x 1 dose at 1800 today 2. Will continue to monitor for any signs/symptoms of bleeding and will follow up with PT/INR in the a.m.    Nicole Cella, RPh Clinical Pharmacist Pager: [phone] 10/21/2014 1:34 PM

## 2014-10-21 NOTE — Progress Notes (Signed)
Patient ID: Scott [name], male   DOB: 07/06/1921, 81 y.o.   MRN: [id number] Patient progressing nicely on rehabilitation floor-4 W. Left AKA stump healing nicely with no evidence of infection  I will see patient in follow-up in 3 weeks in the office

## 2014-10-21 NOTE — Progress Notes (Signed)
Occupational Therapy Session Note  Patient Details  Name: Scott [name] MRN: [id number] Date of Birth: July 22, 1921  Today's Date: 10/21/2014 OT Individual Time: 0930-1030 and 1400-1437 OT Individual Time Calculation (min): 60 min and 37 min   Short Term Goals: Week 1:  OT Short Term Goal 1 (Week 1): Pt. will performing groooming at SBA OT Short Term Goal 2 (Week 1): Pt will perform bathing with min assist OT Short Term Goal 3 (Week 1): Pt will perform dressing with min assist OT Short Term Goal 4 (Week 1): Pt will perform troilet transfer with mod assist OT Short Term Goal 5 (Week 1): Pt will perform shower transfer with mod assist  Skilled Therapeutic Interventions/Progress Updates:    Session One: Pt seen for ADL bathing and dressing session. Pt completed bathing and dressing task seated in w/c at the sink. Pt required +2 assist to stand to pull pants up and to fasten. Pt's caregiver educated regarding recommended clothing for CIR, including loose fitting, easy to don pants.  Pt then taken to therapy gym where he completed 3x sit> stand, standing at side of parallel bars with +2 available for safety. Able to stand with min steadying support with B UE support on first trial, progressing to one UE support on esecond trial, and upgraded to no UE support with mod steadying assist. Verbal and tactile cues provided for upright standing and anterior hip positioning. Pt returned to room at end of session, left sitting in w/c at end of session, all needs in reach.   Session Two: Pt seen for OT session focusing on ADL re-training. Pt asleep sitting up in w/c at end of session with son present. Pt and son voiced desire to complete shaving task. Pt completed grooming task seated in w/c at the sink, able to wash his face and don shaving cream. Assist provided to shave due to pt's lethargy. Pt and caregiver recommended to have electric razor for safety as pt on blood thiners, caregiver voiced understanding.   Pt and caregiver educated regarding role of OT, OT goals, POC, safety with ADLs, energy conservation, and d/c planning.  Pt left sitting in w/c at end of session, all needs in reach.   Therapy Documentation Precautions:  Precautions Precautions: Fall Precaution Comments: contact Restrictions Weight Bearing Restrictions: Yes LLE Weight Bearing: Non weight bearing Pain: Pain Assessment Pain Assessment: No/denies pain  See FIM for current functional status  Therapy/Group: Individual Therapy  Lewis, Romesha Scherer C 10/21/2014, 7:15 AM

## 2014-10-21 NOTE — Progress Notes (Signed)
[facility] Admission Coordinator Signed Physical Medicine and Rehabilitation PMR Pre-admission 10/17/2014 4:29 PM  Related encounter: Admission (Discharged) from 10/15/2014 in [facility]   PMR Admission Coordinator Pre-Admission Assessment  Patient: Scott [name] is an 81 y.o., male MRN: [id number] DOB: 1921-12-02 Height: 5\' 10"  (177.8 cm) Weight: 61.2 kg (134 lb 14.7 oz)  Insurance Information HMO: No PPO: PCP: IPA: 80/20: OTHER:  PRIMARY: Medicare A/B Policy#: [id number] Subscriber: [name] CM Name: Phone#: Fax#:  Pre-Cert#: Employer: Retired Benefits: Phone #: Name: Checked in [place]. Date: 09/10/88 Deduct: $1288 Out of Pocket Max: none Life Max: unlimited CIR: 100% SNF: 100 days Outpatient: 80% Co-Pay: 20% Home Health: 100% Co-Pay: none DME: 80% Co-Pay: 20% Providers: patient's choice  SECONDARY: BCBS Policy#: [id number] Subscriber: [name] CM Name: Phone#: Fax#:  Pre-Cert#: Employer: Retired Benefits: Phone #: [phone] Name:  Eff. Date: Deduct: Out of Pocket Max: Life Max:  CIR: SNF:  Outpatient: Co-Pay:  Home Health: Co-Pay:  DME: Co-Pay:   Emergency Contact Information Contact Information    Name Relation Home Work Mobile   [place]   [place] Relative [phone]  [phone]   Hinchliffe,Geraldine Spouse [phone]     Archie Balboa Sister   [phone]     Current Medical History  Patient Admitting Diagnosis: L AKA  History of Present Illness: A 81 y.o. male with history  of HTN, BLE DVT/ PE, A fib- chronic coumadin, NICM, severe PVD with gangrenous changes left foot with recommendations for amputation due to non-salvageable limb. Patient elected to undergo L-AKA on 10/16/14 by Dr. Kellie Simmering. Post op with agitation due to delirium as well as ABLA. PT/OT evaluations to be done today. CIR recommended due to anticipated rehab needs.    Past Medical History  Past Medical History  Diagnosis Date  . HTN (hypertension)   . Hyperlipidemia   . PVCs (premature ventricular contractions)   . Glaucoma   . PVD (peripheral vascular disease)     Right ABI .75, Left .78 (2006)  . Popliteal artery aneurysm, bilateral DECUMENTED CHRONIC PARTIAL OCCLUSION-- PT DENIES CLAUDICATION OR ANY OTHER SYMPTOMS  . Frequency of urination   . Nocturia   . DVT, bilateral lower limbs 03/2013    a. Bilat DVT dx 03/2013.  . Pulmonary embolism     a. By CT angio 04/2013.  Marland Kitchen Elevated troponin     a. Adm 12/30-04/2013 - not felt to represent ACS; ?due to PE. b. Patent cors 06/2012.  Marland Kitchen NSVT (nonsustained ventricular tachycardia)     a. NSVT 06/2012; NSVT also seen during 03/2013 adm. b. Med rx. Not candidate for ICD given adv age.  Marland Kitchen ETOH abuse   . Syncope     a. Felt to be postural syncope related to diuretics 06/2012.  Marland Kitchen Heart murmur   . DVT (deep venous thrombosis)     "I think in both legs"  . Pneumonia     "couple times"  . DDD (degenerative disc disease)   . Arthritis     "all over"  . Depression   . CAD- non obstructive disease by cath 3/14   . PAD (peripheral artery disease)   . Gangrene of toe 10/03/2014  . Sepsis due to other etiology 10/03/2014  . Chronic systolic heart failure 05/08/49    EF improved 50-55%  . Chronic systolic CHF (congestive heart failure)     a. NICM - patent cors 06/2012,  EF 40% at that time. b. 03/2013 eval: EF 20-25%.  . Chronic lower back  pain   . Bladder cancer 03/15/2012    pt denies this hx on 10/15/2014    Family History  family history includes Arthritis in his mother; Deep vein thrombosis in his father; Early death in his brother; Hypertension in his mother; Lung cancer in his sister.  Prior Rehab/Hospitalizations: Has had HHPT in the past 3 months.  Has the patient had major surgery during 100 days prior to admission? No  Current Medications   Current facility-administered medications:  . acetaminophen (TYLENOL) tablet 650 mg, 650 mg, Oral, Q6H PRN **OR** acetaminophen (TYLENOL) suppository 650 mg, 650 mg, Rectal, Q6H PRN, Samella Parr, NP . alum & mag hydroxide-simeth (MAALOX/MYLANTA) 200-200-20 MG/5ML suspension 30 mL, 30 mL, Oral, Q6H PRN, Samella Parr, NP . bisacodyl (DULCOLAX) suppository 10 mg, 10 mg, Rectal, Daily PRN, Ulyses Amor, PA-C . dextrose 5 %-0.9 % sodium chloride infusion, , Intravenous, Continuous, Samella Parr, NP, Last Rate: 50 mL/hr at 10/15/14 2225, 50 mL/hr at 10/15/14 2225 . docusate sodium (COLACE) capsule 100 mg, 100 mg, Oral, BID, Samella Parr, NP, 100 mg at 10/17/14 2132 . donepezil (ARICEPT) tablet 10 mg, 10 mg, Oral, QHS, Samella Parr, NP, 10 mg at 10/17/14 2132 . febuxostat (ULORIC) tablet 40 mg, 40 mg, Oral, Daily, Samella Parr, NP, 40 mg at 10/17/14 1044 . folic acid injection 1 mg, 1 mg, Intravenous, Daily, Samella Parr, NP, 1 mg at 10/17/14 1137 . gabapentin (NEURONTIN) capsule 200 mg, 200 mg, Oral, BID, Samella Parr, NP, 200 mg at 10/17/14 2132 . guaiFENesin-dextromethorphan (ROBITUSSIN DM) 100-10 MG/5ML syrup 15 mL, 15 mL, Oral, Q4H PRN, Ulyses Amor, PA-C . haloperidol lactate (HALDOL) injection 1 mg, 1 mg, Intravenous, Q4H PRN, Silver Huguenin Elgergawy, MD . hydrALAZINE (APRESOLINE) injection 5 mg, 5 mg, Intravenous, Q20 Min PRN, Ulyses Amor, PA-C . isosorbide mononitrate (IMDUR) 24 hr tablet 15 mg, 15 mg, Oral, Daily, Samella Parr, NP, 15 mg at 10/17/14 1045 . labetalol (NORMODYNE,TRANDATE) injection 10 mg, 10 mg, Intravenous, Q10 min PRN, Ulyses Amor, PA-C . magnesium sulfate IVPB 2 g 50 mL, 2 g, Intravenous, Daily PRN, Ulyses Amor, PA-C . metoprolol (LOPRESSOR) injection 2-5 mg, 2-5 mg, Intravenous, Q2H PRN, Ulyses Amor, PA-C . metoprolol succinate (TOPROL-XL) 24 hr tablet 50 mg, 50 mg, Oral, Daily, Samella Parr, NP, 50 mg at 10/17/14 1044 . morphine 2 MG/ML injection 2 mg, 2 mg, Intravenous, Q4H PRN, Gardiner Barefoot, NP, 2 mg at 10/17/14 1045 . ondansetron (ZOFRAN) tablet 4 mg, 4 mg, Oral, Q6H PRN **OR** ondansetron (ZOFRAN) injection 4 mg, 4 mg, Intravenous, Q6H PRN, Samella Parr, NP . oxyCODONE (Oxy IR/ROXICODONE) immediate release tablet 5-10 mg, 5-10 mg, Oral, Q4H PRN, Ulyses Amor, PA-C, 10 mg at 10/17/14 2228 . pantoprazole (PROTONIX) EC tablet 40 mg, 40 mg, Oral, Daily, Ulyses Amor, PA-C, 40 mg at 10/17/14 1043 . phenol (CHLORASEPTIC) mouth spray 1 spray, 1 spray, Mouth/Throat, PRN, Ulyses Amor, PA-C . potassium chloride SA (K-DUR,KLOR-CON) CR tablet 20-40 mEq, 20-40 mEq, Oral, Daily PRN, Ulyses Amor, PA-C . senna-docusate (Senokot-S) tablet 1 tablet, 1 tablet, Oral, QHS PRN, Ulyses Amor, PA-C . simvastatin (ZOCOR) tablet 10 mg, 10 mg, Oral, Daily, Samella Parr, NP, 10 mg at 10/17/14 1044 . sodium chloride 0.9 % injection 3 mL, 3 mL, Intravenous, Q12H, Samella Parr, NP, 3  mL at 10/17/14 2132 . tamsulosin (FLOMAX) capsule 0.4 mg, 0.4 mg, Oral, QPC supper, Samella Parr, NP, 0.4 mg at 10/17/14 1756 . thiamine (B-1) injection 100 mg, 100 mg, Intravenous, Daily, Samella Parr, NP, 100 mg at 10/17/14 1045 . Warfarin - Pharmacist Dosing Inpatient, , Does not apply, q1800, Reginia Naas, RPH, 0 at 10/16/14 1800  Patients Current Diet: Diet Heart Room service appropriate?: Yes; Fluid consistency:: Thin  Precautions /  Restrictions Precautions Precautions: Fall Restrictions Weight Bearing Restrictions: No LLE Weight Bearing: Non weight bearing   Has the patient had 2 or more falls or a fall with injury in the past year?Yes. Has had 2 falls in the past year with no injuries.  Prior Activity Level Limited Community (1-2x/wk): Goes out about 3 X a week.  Home Assistive Devices / Equipment Home Assistive Devices/Equipment: Environmental consultant (specify type), Cane (specify quad or straight), Bedside commode/3-in-1, Shower chair without back, Hospital bed, Grab bars in shower, Hand-held shower hose Home Equipment: Walker - 2 wheels, Cane - single point, Bedside commode, Tub bench, Other (comment)  Prior Device Use: Indicate devices/aids used by the patient prior to current illness, exacerbation or injury? Manual wheelchair and None of the above. Used a 3 prong cane and a straight cane. Three weeks ago had to start using a wheelchair.  Prior Functional Level Prior Function Level of Independence: Needs assistance Gait / Transfers Assistance Needed: Pt states that he was using a cane to transfer to the wheelchair and occasionally needed assistance ADL's / Homemaking Assistance Needed: Pt was performing all bathing, dressing, and toileting with supervision up until approximately 3 weeks ago.   Self Care: Did the patient need help bathing, dressing, using the toilet or eating? Independent. Was independent up until 3 months ago  Indoor Mobility: Did the patient need assistance with walking from room to room (with or without device)? Independent. Used a cane up until 3 weeks ago when he began to use a wheelchair.  Stairs: Did the patient need assistance with internal or external stairs (with or without device)? Unknown. Per son does not have to go up or down stairs.  Functional Cognition: Did the patient need help planning regular tasks such as shopping or remembering to take medications? Needed some help  Current  Functional Level Cognition  Overall Cognitive Status: Impaired/Different from baseline Orientation Level: Oriented to person, Oriented to situation Following Commands: Follows one step commands inconsistently General Comments: Pt appropriate during session and talking to his son on the phone as therapist entered the room. Able to recall only 1/3 words after approximately 2 min delay.    Extremity Assessment (includes Sensation/Coordination)  Upper Extremity Assessment: Generalized weakness  Lower Extremity Assessment: Defer to PT evaluation RLE Deficits / Details: generalized weakness LLE Deficits / Details: Pt holding residual limb up in the air due to pain, and had a difficult time relaxing it down on the bed.     ADLs  Eating/Feeding: Independent Grooming: Wash/dry hands, Wash/dry face, Sitting, Supervision/safety Grooming Details (indicate cue type and reason): simulated Upper Body Bathing: Supervision/ safety, Sitting Lower Body Bathing: +2 for physical assistance, Moderate assistance, Sit to/from stand Lower Body Dressing: +2 for physical assistance, Moderate assistance, Sit to/from stand Toilet Transfer: Stand-pivot, RW, Maximal assistance Toileting- Clothing Manipulation and Hygiene: +2 for physical assistance, Moderate assistance Functional mobility during ADLs: Maximal assistance General ADL Comments: Pt needed max assist for sit to stand from the bedside chair with max demonstrational cueing for hand placement with  sit to stand. Pt's son talked with therapist via phone and really wants pt to go to inpatient rehab and not SNF. He reassured therapist that pt has physical 24 hour care at discharge. Feel he is appropriate to make progess as he was actually able to take 3 step forward with the walker and mod-max assist     Mobility  Overal bed mobility: Needs Assistance Bed Mobility: Supine to Sit Supine to sit: Mod assist, +2 for physical assistance General bed  mobility comments: Hand-over-hand assist to reach for the bed rails. Pt was able to help pull himself over to the EOB, and therapist/tech assisted with bed pad.     Transfers  Overall transfer level: Needs assistance Equipment used: Rolling walker (2 wheeled), 2 person hand held assist Transfers: Sit to/from Stand, Squat Pivot Transfers Sit to Stand: Max assist Stand pivot transfers: Max assist Squat pivot transfers: Max assist, +2 physical assistance General transfer comment: Pt was able to maintain static standing with mod assist and take 3 steps for simulated transfer with the RW with max assist.     Ambulation / Gait / Stairs / Wheelchair Mobility   not assessed, anticipate needs    Posture / Balance Balance Overall balance assessment: Needs assistance Sitting-balance support: Feet supported, Bilateral upper extremity supported Sitting balance-Leahy Scale: Fair Standing balance support: Bilateral upper extremity supported, During functional activity Standing balance-Leahy Scale: Poor Standing balance comment: Pt able to stand for 1-2 mins with mod assist statically using RW for support.    Special needs/care consideration BiPAP/CPAPNo CPM No Continuous Drip IV KVO Dialysis No  Life Vest No Oxygen No Special Bed No Trach Size No Wound Vac (area) No  Skin Has a new L AKA incision with dressing.  Bowel mgmt: Last documented BM 10/14/14 Bladder mgmt: Voiding with urgency and incontinence at times. Has leakage. Diabetic mgmt No Contact Isolation for MRSA: Yes    Previous Home Environment Living Arrangements: Spouse/significant other, Other (Comment) (24 hour caretakers per chart review) Available Help at Discharge: Available 24 hours/day, Personal care attendant Type of Home: House Home Layout: One level Home Access: Ramped entrance Bathroom Shower/Tub: Multimedia programmer: Standard Home Care Services:  Yes Type of Home Care Services: Biomedical scientist, Actuary, [place] (if known): "[facility] for wound; private pay for caretaker" Additional Comments: Harrel Lemon lift as pt's wife has paraplegia  Discharge Living Setting Plans for Discharge Living Setting: Patient's home, House, Lives with (comment) (Lives with wife.) Type of Home at Discharge: House Discharge Home Layout: One level Discharge Home Access: Ramped entrance Does the patient have any problems obtaining your medications?: No  Social/Family/Support Systems Patient Roles: Spouse, Parent (Has a wife and a son.) Wife has been a paraplegic since 2012 and needs 24/7 care. Contact Information: Waylen Depaolo - son Anticipated Caregiver: Has 24/7 caregivers at home Anticipated Caregiver's Contact Information: See emergency contacts Ability/Limitations of Caregiver: son lives 12 miles away. Wife already has 24/7 caregivers at home. Caregiver Availability: 24/7 Discharge Plan Discussed with Primary Caregiver: Yes Is Caregiver In Agreement with Plan?: Yes Does Caregiver/Family have Issues with Lodging/Transportation while Pt is in Rehab?: No  Goals/Additional Needs Patient/Family Goal for Rehab: PT/OT supervision to min assist goals Expected length of stay: 12-18 days Cultural Considerations: [facility] Dietary Needs: Heart diet, thin liquids Equipment Needs: TBD Pt/Family Agrees to Admission and willing to participate: Yes Program Orientation Provided & Reviewed with Pt/Caregiver Including Roles & Responsibilities: Yes  Decrease burden of Care through IP  rehab admission: N/A  Possible need for SNF placement upon discharge: Yes, if patient does not regain sufficient independence to discharge home, may need SNF for further rehab.  Patient Condition: This patient's condition remains as documented in the consult dated 10-17-14, in which the Rehabilitation Physician determined and documented that the patient's condition is  appropriate for intensive rehabilitative care in an inpatient rehabilitation facility. Will admit to inpatient rehab today.  Preadmission Screen Completed By: Nanetta Batty, PT, 10/18/2014 10:09 AM ______________________________________________________________________  Discussed status with Dr. Naaman Plummer on 10-18-14 at 1009 and received telephone approval for admission today.  Admission Coordinator: Nanetta Batty, PT, time1009/Date 10-18-14          Cosigned by: Meredith Staggers, MD at 10/18/2014 10:53 AM  Revision History     Date/Time User Provider Type Action   10/18/2014 10:53 AM Meredith Staggers, MD Physician Cosign   10/18/2014 10:11 AM [facility] Admission Coordinator Sign   10/18/2014 10:11 AM Quentin Mulling Chamar [facility]oughton Rehab Admission Coordinator Share   10/18/2014 10:06 AM [facility] Admission Coordinator Share   10/17/2014 4:41 PM Retta Diones, RN Rehab Admission Coordinator Share   View Details Report

## 2014-10-21 NOTE — Progress Notes (Signed)
Nursing Note: Bladder scan for 407 ml.Pt uable to void.He felt no urge per pt. A: Pt in and out cathed for 300 cc of clear yellow urine.wbb

## 2014-10-21 NOTE — Progress Notes (Signed)
[facility] PHYSICAL MEDICINE & REHABILITATION     PROGRESS NOTE    Subjective/Complaints: Slept well. Pain under fair control. 2.5mg  oxycodone seems to cover pain. Apparently son is concerned about therapy intensity.   ROS: Pt denies fever, rash/itching, headache, blurred or double vision, nausea, vomiting, abdominal pain, diarrhea, chest pain, shortness of breath, palpitations, dysuria, dizziness, neck or back pain, bleeding, anxiety, or depression   Objective: Vital Signs: Blood pressure 135/52, pulse 62, temperature 98.6 F (37 C), temperature source Oral, resp. rate 18, weight 66.543 kg (146 lb 11.2 oz), SpO2 99 %. No results found.  Recent Labs  10/19/14 0510 10/20/14 0500  WBC 7.1 6.8  HGB 7.6* 8.4*  HCT 23.8* 25.8*  PLT 317 358    Recent Labs  10/19/14 0510  NA 136  K 4.1  CL 104  GLUCOSE 92  BUN 20  CREATININE 1.00  CALCIUM 8.8*   CBG (last 3)   Recent Labs  10/18/14 1114  GLUCAP 96    Wt Readings from Last 3 Encounters:  10/21/14 66.543 kg (146 lb 11.2 oz)  10/18/14 61.2 kg (134 lb 14.7 oz)  10/07/14 70.6 kg (155 lb 10.3 oz)    Physical Exam:  Constitutional: He is oriented to person, place, and time. He appears well-developed and well-nourished.  HENT: dentition poor. Mucosa fairly moist Head: Normocephalic and atraumatic.  Eyes: Conjunctivae are normal. Pupils are equal, round, and reactive to light.  Neck: Normal range of motion. Neck supple.  Cardiovascular: Normal rate and regular rhythm.no murmur Respiratory: Effort normal and breath sounds normal. No respiratory distress. He has no wheezes.  GI: Soft. Bowel sounds are normal. He exhibits no distension. There is no tenderness.  Musculoskeletal: He exhibits edema.  RLE with 1+edema ----stable to improved Neurological: He is alert and oriented to person, place, and time. Improved insight and awareness. UE 4/5 prox to distal. RLE: 3/5 hf, ke and 4/5 adf/apf, LLE: 3/5 HF,  Skin:  Skin is warm and dry. Incision c/d/i  Assessment/Plan: 1. Functional deficits secondary to left AKA which require 3+ hours per day of interdisciplinary therapy in a comprehensive inpatient rehab setting. Physiatrist is providing close team supervision and 24 hour management of active medical problems listed below. Physiatrist and rehab team continue to assess barriers to discharge/monitor patient progress toward functional and medical goals. FIM: FIM - Bathing Bathing Steps Patient Completed: Chest, Right Arm, Left Arm, Abdomen, Right upper leg, Left upper leg Bathing: 3: Mod-Patient completes 5-7 32f 10 parts or 50-74%  FIM - Upper Body Dressing/Undressing Upper body dressing/undressing steps patient completed: Thread/unthread right sleeve of pullover shirt/dresss, Thread/unthread left sleeve of pullover shirt/dress, Put head through opening of pull over shirt/dress Upper body dressing/undressing: 4: Min-Patient completed 75 plus % of tasks FIM - Lower Body Dressing/Undressing Lower body dressing/undressing: 1: Total-Patient completed less than 25% of tasks  FIM - Toileting Toileting: 1: Total-Patient completed zero steps, helper did all 3  FIM - Radio producer Devices: Grab bars Toilet Transfers: 2-To toilet/BSC: Max A (lift and lower assist), 2-From toilet/BSC: Max A (lift and lower assist)  FIM - Control and instrumentation engineer Devices: Sliding board, Arm rests, Bed rails, HOB elevated Bed/Chair Transfer: 3: Sit > Supine: Mod A (lifting assist/Pt. 50-74%/lift 2 legs), 1: Two helpers  FIM - Locomotion: Wheelchair Distance: 150 Locomotion: Wheelchair: 0: Activity did not occur FIM - Locomotion: Ambulation Ambulation/Gait Assistance: 1: +2 Total assist Locomotion: Ambulation: 0: Activity did not occur  Comprehension Comprehension  Mode: Auditory Comprehension: 4-Understands basic 75 - 89% of the time/requires cueing 10 - 24% of the  time  Expression Expression Mode: Verbal Expression: 3-Expresses basic 50 - 74% of the time/requires cueing 25 - 50% of the time. Needs to repeat parts of sentences.  Social Interaction Social Interaction: 3-Interacts appropriately 50 - 74% of the time - May be physically or verbally inappropriate.  Problem Solving Problem Solving: 3-Solves basic 50 - 74% of the time/requires cueing 25 - 49% of the time  Memory Memory: 3-Recognizes or recalls 50 - 74% of the time/requires cueing 25 - 49% of the time   Medical Problem List and Plan: 1. Functional deficits secondary to L-AKA due to severe PVD with gangrenous changes.  2. H/o PE/DVT/Anticoagulation: Pharmaceutical: Coumadin 3. Pain Management: limit oxycodone due mental status 4. Mood: LCSW to follow for evaluation and support.  5. Neuropsych: This patient is not capable of making decisions on his own behalf. 6. Skin/Wound Care: Monitor wound daily for healing. Routine pressure relief measures.   -stump sock 7. Fluids/Electrolytes/Nutrition: encourage PO intake.  8. ABLA:  iron supplement added. hgb back tup to 8.4 today---no signs of bleeding 9. Non-obstructive CAD: On BB and statin.  10. Dementia: Continue Aricept. Delirium resolved---need with narcotics/neurosedating meds 11. CKD stage IV: Improved with hydration. Monitor for now.  12. Combined systolic and diastolic HCW:[id number], nitrates and statin. Lasix Currently on hold.does not appear volume overloaded at present  -weight 66kg---trending up  -will resume low dose lasix 13. BPH- On Flomax.   LOS (Days) 3 A FACE TO FACE EVALUATION WAS PERFORMED  Craig Ionescu T 10/21/2014 8:57 AM

## 2014-10-22 ENCOUNTER — Inpatient Hospital Stay (HOSPITAL_COMMUNITY): Payer: Medicare Other

## 2014-10-22 ENCOUNTER — Inpatient Hospital Stay (HOSPITAL_COMMUNITY): Payer: Medicare Other | Admitting: Occupational Therapy

## 2014-10-22 LAB — URINALYSIS, ROUTINE W REFLEX MICROSCOPIC
Bilirubin Urine: NEGATIVE
Glucose, UA: NEGATIVE mg/dL
Hgb urine dipstick: NEGATIVE
Ketones, ur: NEGATIVE mg/dL
Nitrite: NEGATIVE
Protein, ur: NEGATIVE mg/dL
Specific Gravity, Urine: 1.013 (ref 1.005–1.030)
Urobilinogen, UA: 1 mg/dL (ref 0.0–1.0)
pH: 5 (ref 5.0–8.0)

## 2014-10-22 LAB — URINE MICROSCOPIC-ADD ON

## 2014-10-22 LAB — PROTIME-INR
INR: 2.77 — ABNORMAL HIGH (ref 0.00–1.49)
Prothrombin Time: 28.8 seconds — ABNORMAL HIGH (ref 11.6–15.2)

## 2014-10-22 MED ORDER — ENSURE ENLIVE PO LIQD
237.0000 mL | Freq: Two times a day (BID) | ORAL | Status: DC
  Administered 2014-10-22 – 2014-11-04 (×23): 237 mL via ORAL

## 2014-10-22 MED ORDER — WARFARIN SODIUM 5 MG PO TABS
5.0000 mg | ORAL_TABLET | Freq: Once | ORAL | Status: AC
  Administered 2014-10-22: 5 mg via ORAL
  Filled 2014-10-22: qty 1

## 2014-10-22 NOTE — Patient Care Conference (Signed)
Inpatient RehabilitationTeam Conference and Plan of Care Update Date: 10/22/2014   Time: 2:30 PM    Patient Name: Scott [name]      Medical Record Number: [id number]  Date of Birth: 1921-05-10 Sex: Male         Room/Bed: 4W16C/4W16C-01 Payor Info: Payor: MEDICARE / Plan: MEDICARE PART A AND B / Product Type: *No Product type* /    Admitting Diagnosis: L AKA  Admit Date/Time:  10/18/2014  4:06 PM Admission Comments: No comment available   Primary Diagnosis:  Status post above knee amputation of left lower extremity Principal Problem: Status post above knee amputation of left lower extremity  Patient Active Problem List   Diagnosis Date Noted  . Status post above knee amputation of left lower extremity 10/18/2014  . Tachyarrhythmia 10/05/2014  . Aspiration pneumonia 10/05/2014  . UTI (lower urinary tract infection) 10/03/2014  . Sepsis due to other etiology 10/03/2014  . Gangrene of toe 10/03/2014  . Ischemic ulcer of left foot 10/03/2014  . Atherosclerotic PVD with ulceration 10/03/2014  . Warfarin-induced coagulopathy 10/03/2014  . Atrial fibrillation with RVR 10/03/2014  . Acute kidney injury 10/03/2014  . Hyperkalemia 10/03/2014  . Cellulitis of left leg 10/03/2014  . PAD (peripheral artery disease) 09/24/2014  . NSTEMI (non-ST elevated myocardial infarction) 10/09/2013  . High risk medication use 05/17/2013  . BPH (benign prostatic hyperplasia) 05/17/2013  . Vitamin D deficiency 05/17/2013  . History of pulmonary embolism (2014) 04/17/2013  . NSVT (nonsustained ventricular tachycardia)- not an ICD candidate 04/17/2013  . NICM (nonischemic cardiomyopathy)- EF 20-25% by Echo 04/12/13 04/17/2013  . Chronic diastolic heart failure, NYHA class 1 03/15/2013  . DVT of lower extremity, bilateral 03/15/2013  . Fatigue 11/02/2012  . Constipation 11/02/2012  . Alcohol abuse 08/27/2012  . Syncope 06/21/2012  . Bladder cancer 03/15/2012  . Benign localized hyperplasia of prostate with  urinary obstruction 03/15/2012  . Benign essential HTN   . Hyperlipidemia   . PVCs (premature ventricular contractions)   . CAD- non obstructive disease by cath 3/14     Expected Discharge Date: Expected Discharge Date: 11/01/14  Team Members Present: Physician leading conference: Dr. Alger Simons Social Worker Present: Lennart Pall, LCSW Nurse Present: Heather Roberts, RN PT Present: Canary Brim, Cecille Rubin, PT OT Present: Salome Spotted, OT;Other (comment) (Amy Bobby Rumpf, OT) SLP Present: Weston Anna, SLP PPS Coordinator present : Daiva Nakayama, RN, CRRN     Current Status/Progress Goal Weekly Team Focus  Medical   left AKA, poor activity tolerance  wound care  cv balance, pain control,    Bowel/Bladder   continent of bowel and bladder: LBM 7/11  Minimal assistance for bowel and bladder  avoid use of condom cath to avoid skin breakdown   Swallow/Nutrition/ Hydration             ADL's   max-total A LB dressing and toileting; Set-up UB dressing and grooming  Overall supervision- min A  Toileting; LB dressing; standing balance; activity tolerance.    Mobility   mod A overall w/c level; +2 for pre-gait  S/min A w/c level; mod I w/c propulsion; mod A gait with PT short distances  endurance, strengthening, transfers, sit to stands, HEP, education   Communication             Safety/Cognition/ Behavioral Observations            Pain   2.5mg  Oxy IR q 6hr for pain   <4 on a 0-10 scale  assess  q 4hr and medicate as needed   Skin   L AKA with staples OTA, skin tear to R elbow  remain free from infection/breakdown while on rehab  assess skin q shift    Rehab Goals Patient on target to meet rehab goals: Yes *See Care Plan and progress notes for long and short-term goals.  Barriers to Discharge: age, activity tolerance    Possible Resolutions to Barriers:  less intensity, 15/7 therapy    Discharge Planning/Teaching Needs:  home with private duty care vs SNF if too great for  caregiver   ongoing   Team Discussion:  MD had lengthy discussion with son this am.  Monitor weights.  Pt variable in physical abilities requiring s/min assist - total assist at times.  Goals set for s/min but concern is whether he can be there consistently.  Poor carryover and motor planning.  CAN BE continent only with planned toileting - pt has no awareness of b/b needs.  Revisions to Treatment Plan:  None   Continued Need for Acute Rehabilitation Level of Care: The patient requires daily medical management by a physician with specialized training in physical medicine and rehabilitation for the following conditions: Daily direction of a multidisciplinary physical rehabilitation program to ensure safe treatment while eliciting the highest outcome that is of practical value to the patient.: Yes Daily medical management of patient stability for increased activity during participation in an intensive rehabilitation regime.: Yes Daily analysis of laboratory values and/or radiology reports with any subsequent need for medication adjustment of medical intervention for : Post surgical problems;Pulmonary problems  Delisha Peaden 10/22/2014, 3:43 PM

## 2014-10-22 NOTE — Progress Notes (Signed)
Physical Therapy Session Note  Patient Details  Name: Scott [name] MRN: [id number] Date of Birth: [birth year]  Today's Date: 10/22/2014 PT Individual Time: 1445-1515 PT Individual Time Calculation (min): 30 min   Short Term Goals: Week 1:  PT Short Term Goal 1 (Week 1): Pt will demonstrate supine to sit bed mobility req min A.  PT Short Term Goal 2 (Week 1): Pt will demonstrate squat-pivot transfers req min A.  PT Short Term Goal 3 (Week 1): Pt will propel manual w/c >= 150' req SBA.  PT Short Term Goal 4 (Week 1): Pt will ambulate 25' req min A.  PT Short Term Goal 5 (Week 1): Pt will demonstrate rolling in bed with SBA/verbal cues.   Skilled Therapeutic Interventions/Progress Updates:   Discussed with pt plan for change of schedule to 15/7 to address tolerance to therapies. Pt in agreement.   Session focused on functional strengthening to BLE to aid with overall transfers and mobility as well as activity tolerance and muscular endurance. Performed AAROM to LLE and AROM to RLE. LLE including hip flexion, extension and abduction in supine and sidelying as appropriate x 10 reps x 2 sets each. RLE including heel slides, hip abduction and SAQ x 2 sets of 10 reps each. Pt left in bed with all needs in reach.  Therapy Documentation Precautions:  Precautions Precautions: Fall Precaution Comments: contact Restrictions Weight Bearing Restrictions: Yes LLE Weight Bearing: Non weight bearing  Pain: Pain Assessment Pain Assessment: No/denies pain  See FIM for current functional status  Therapy/Group: Individual Therapy  Canary Brim Ivory Broad, PT, DPT  10/22/2014, 3:17 PM

## 2014-10-22 NOTE — Progress Notes (Signed)
Physical Therapy Session Note  Patient Details  Name: Scott [name] MRN: [id number] Date of Birth: 05/30/1921  Today's Date: 10/22/2014 PT Individual Time: 1000-1100 PT Individual Time Calculation (min): 60 min   Short Term Goals: Week 1:  PT Short Term Goal 1 (Week 1): Pt will demonstrate supine to sit bed mobility req min A.  PT Short Term Goal 2 (Week 1): Pt will demonstrate squat-pivot transfers req min A.  PT Short Term Goal 3 (Week 1): Pt will propel manual w/c >= 150' req SBA.  PT Short Term Goal 4 (Week 1): Pt will ambulate 25' req min A.  PT Short Term Goal 5 (Week 1): Pt will demonstrate rolling in bed with SBA/verbal cues.   Skilled Therapeutic Interventions/Progress Updates:   Session focused on functional bed mobility retraining on flat surface for supine to sit with S cues and extra time, squat/scoot pivot transfer training with emphasis on sequencing and technique multiple repetitions needed due to difficulty with motor planning despite demonstration by therapist, and w/c propulsion for strengthening, endurance and functional mobility training. Educated pt on parts management of w/c in order to set-up for squat pivot transfer with verbal cues for positioning of w/c. Pt requires extra time for all tasks and demonstrates difficulty with performing transfer despite cueing and physical demonstration (though able to perform yesterday without cues for actual transfer needed in second session). Part task training to address scooting component on EOM in both directions with some carry over. Pt able to scoot transfer with min A and mod A needed for lift to clear bottom. Returned to room with all needs in reach and notified NT that pt states he was ready for his meal.   Therapy Documentation Precautions:  Precautions Precautions: Fall Precaution Comments: contact Restrictions Weight Bearing Restrictions: Yes LLE Weight Bearing: Non weight bearing  Pain: Denies pain.   See FIM for  current functional status  Therapy/Group: Individual Therapy  Canary Brim Ivory Broad, PT, DPT  10/22/2014, 1:19 PM

## 2014-10-22 NOTE — Progress Notes (Signed)
Social Work Patient ID: Scott [name], male   DOB: 1921/08/11, 81 y.o.   MRN: [id number]   Lowella Curb, LCSW Social Worker Signed  Patient Care Conference 10/22/2014  3:43 PM    Expand All Collapse All   Inpatient RehabilitationTeam Conference and Plan of Care Update Date: 10/22/2014   Time: 2:30 PM     Patient Name: Scott [name]       Medical Record Number: [id number]  Date of Birth: October 19, 1921 Sex: Male         Room/Bed: 4W16C/4W16C-01 Payor Info: Payor: MEDICARE / Plan: MEDICARE PART A AND B / Product Type: *No Product type* /    Admitting Diagnosis: L AKA   Admit Date/Time:  10/18/2014  4:06 PM Admission Comments: No comment available   Primary Diagnosis:  Status post above knee amputation of left lower extremity Principal Problem: Status post above knee amputation of left lower extremity    Patient Active Problem List     Diagnosis  Date Noted   .  Status post above knee amputation of left lower extremity  10/18/2014   .  Tachyarrhythmia  10/05/2014   .  Aspiration pneumonia  10/05/2014   .  UTI (lower urinary tract infection)  10/03/2014   .  Sepsis due to other etiology  10/03/2014   .  Gangrene of toe  10/03/2014   .  Ischemic ulcer of left foot  10/03/2014   .  Atherosclerotic PVD with ulceration  10/03/2014   .  Warfarin-induced coagulopathy  10/03/2014   .  Atrial fibrillation with RVR  10/03/2014   .  Acute kidney injury  10/03/2014   .  Hyperkalemia  10/03/2014   .  Cellulitis of left leg  10/03/2014   .  PAD (peripheral artery disease)  09/24/2014   .  NSTEMI (non-ST elevated myocardial infarction)  10/09/2013   .  High risk medication use  05/17/2013   .  BPH (benign prostatic hyperplasia)  05/17/2013   .  Vitamin D deficiency  05/17/2013   .  History of pulmonary embolism (2014)  04/17/2013   .  NSVT (nonsustained ventricular tachycardia)- not an ICD candidate  04/17/2013   .  NICM (nonischemic cardiomyopathy)- EF 20-25% by Echo 04/12/13  04/17/2013   .  Chronic  diastolic heart failure, NYHA class 1  03/15/2013   .  DVT of lower extremity, bilateral  03/15/2013   .  Fatigue  11/02/2012   .  Constipation  11/02/2012   .  Alcohol abuse  08/27/2012   .  Syncope  06/21/2012   .  Bladder cancer  03/15/2012   .  Benign localized hyperplasia of prostate with urinary obstruction  03/15/2012   .  Benign essential HTN     .  Hyperlipidemia     .  PVCs (premature ventricular contractions)     .  CAD- non obstructive disease by cath 3/14       Expected Discharge Date: Expected Discharge Date: 11/01/14  Team Members Present: Physician leading conference: Dr. Alger Simons Social Worker Present: Lennart Pall, LCSW Nurse Present: Heather Roberts, RN PT Present: Canary Brim, Cecille Rubin, PT OT Present: Salome Spotted, OT;Other (comment) Napoleon Form, OT) SLP Present: Weston Anna, SLP PPS Coordinator present : Daiva Nakayama, RN, CRRN        Current Status/Progress  Goal  Weekly Team Focus   Medical     left AKA, poor activity tolerance  wound care  cv balance, pain control,  Bowel/Bladder     continent of bowel and bladder: LBM 7/11  Minimal assistance for bowel and bladder   avoid use of condom cath to avoid skin breakdown    Swallow/Nutrition/ Hydration               ADL's     max-total A LB dressing and toileting; Set-up UB dressing and grooming  Overall supervision- min A  Toileting; LB dressing; standing balance; activity tolerance.    Mobility     mod A overall w/c level; +2 for pre-gait   S/min A w/c level; mod I w/c propulsion; mod A gait with PT short distances  endurance, strengthening, transfers, sit to stands, HEP, education   Communication               Safety/Cognition/ Behavioral Observations              Pain     2.5mg  Oxy IR q 6hr for pain   <4 on a 0-10 scale  assess q 4hr and medicate as needed    Skin     L AKA with staples OTA, skin tear to R elbow   remain free from infection/breakdown while on rehab   assess skin q shift     Rehab Goals Patient on target to meet rehab goals: Yes *See Care Plan and progress notes for long and short-term goals.    Barriers to Discharge:  age, activity tolerance     Possible Resolutions to Barriers:   less intensity, 15/7 therapy     Discharge Planning/Teaching Needs:   home with private duty care vs SNF if too great for caregiver    ongoing    Team Discussion:    MD had lengthy discussion with son this am.  Monitor weights.  Pt variable in physical abilities requiring s/min assist - total assist at times.  Goals set for s/min but concern is whether he can be there consistently.  Poor carryover and motor planning.  CAN BE continent only with planned toileting - pt has no awareness of b/b needs.   Revisions to Treatment Plan:    None    Continued Need for Acute Rehabilitation Level of Care: The patient requires daily medical management by a physician with specialized training in physical medicine and rehabilitation for the following conditions: Daily direction of a multidisciplinary physical rehabilitation program to ensure safe treatment while eliciting the highest outcome that is of practical value to the patient.: Yes Daily medical management of patient stability for increased activity during participation in an intensive rehabilitation regime.: Yes Daily analysis of laboratory values and/or radiology reports with any subsequent need for medication adjustment of medical intervention for : Post surgical problems;Pulmonary problems  Aireonna Bauer 10/22/2014, 3:43 PM

## 2014-10-22 NOTE — Progress Notes (Addendum)
[facility]IC[facility]ILIT[facility]     PROGRESS NOTE    Subjective/Complaints: Had a fair night.   Pain under reasonable control. Low grade temp noted  ROS: Pt denies fever, rash/itching, headache, blurred or double vision, nausea, vomiting, abdominal pain, diarrhea, chest pain, shortness of breath, palpitations, dysuria, dizziness, neck or back pain, bleeding, anxiety, or depression   Objective: Vital Signs: Blood pressure 145/57, pulse 64, temperature 99.4 F (37.4 C), temperature source Oral, resp. rate 18, weight 65.4 kg (144 lb 2.9 oz), SpO2 100 %. No results found.  Recent Labs  10/20/14 0500  WBC 6.8  HGB 8.4*  HCT 25.8*  PLT 358   No results for input(s): NA, K, CL, GLUCOSE, BUN, CREATININE, CALCIUM in the last 72 hours.  Invalid input(s): CO CBG (last 3)  No results for input(s): GLUCAP in the last 72 hours.  Wt Readings from Last 3 Encounters:  10/22/14 65.4 kg (144 lb 2.9 oz)  10/18/14 61.2 kg (134 lb 14.7 oz)  10/07/14 70.6 kg (155 lb 10.3 oz)    Physical Exam:  Constitutional: He is oriented to person, place, and time. He appears well-developed and well-nourished.  HENT: dentition poor. Mucosa fairly moist Head: Normocephalic and atraumatic.  Eyes: Conjunctivae are normal. Pupils are equal, round, and reactive to light.  Neck: Normal range of motion. Neck supple.  Cardiovascular: Normal rate and regular rhythm.no murmur Respiratory: Effort normal and breath sounds normal. No respiratory distress. He has no wheezes.  GI: Soft. Bowel sounds are normal. He exhibits no distension. There is no tenderness.  Musculoskeletal: He exhibits edema.  RLE with 1+edema ----stable to improved Neurological: He is alert and oriented to person, place, and time. Improved insight and awareness. UE 4/5 prox to distal. RLE: 3/5 hf, ke and 4/5 adf/apf, LLE: 3/5 HF,  Skin: Skin is warm and dry. Incision c/d/i  Assessment/Plan: 1. Functional deficits  secondary to left AKA which require 15 hours over 7 days/week of interdisciplinary therapy in a comprehensive inpatient rehab setting. Intensity adjusted to better accommodate the patient's activity tolerance. Physiatrist is providing close team supervision and 24 hour management of active medical problems listed below. Physiatrist and rehab team continue to assess barriers to discharge/monitor patient progress toward functional and medical goals. FIM: FIM - Bathing Bathing Steps Patient Completed: Chest, Right Arm, Left Arm, Abdomen, Right upper leg, Left upper leg Bathing: 3: Mod-Patient completes 5-7 25f 10 parts or 50-74%  FIM - Upper Body Dressing/Undressing Upper body dressing/undressing steps patient completed: Thread/unthread right sleeve of pullover shirt/dresss, Thread/unthread left sleeve of pullover shirt/dress, Put head through opening of pull over shirt/dress Upper body dressing/undressing: 5: Set-up assist to: Obtain clothing/put away FIM - Lower Body Dressing/Undressing Lower body dressing/undressing: 1: Two helpers  FIM - Musician Devices: Grab bar or rail for support Toileting: 1: Total-Patient completed zero steps, helper did all 3  FIM - Radio producer Devices: Grab bars Toilet Transfers: 2-To toilet/BSC: Max A (lift and lower assist), 2-From toilet/BSC: Max A (lift and lower assist)  FIM - Control and instrumentation engineer Devices: Arm rests, Copy: 4: Chair or W/C > Bed: Min A (steadying Pt. > 75%), 2: Bed > Chair or W/C: Max A (lift and lower assist), 1: Two helpers (+2 for stand pivot with RW to w/c from mat)  FIM - Locomotion: Wheelchair Distance: 150 Locomotion: Wheelchair: 2: Travels 50 - 149 ft with supervision, cueing or coaxing FIM - Locomotion: Ambulation Ambulation/Gait  Assistance: 1: +2 Total assist Locomotion: Ambulation: 0: Activity did not  occur  Comprehension Comprehension Mode: Auditory Comprehension: 4-Understands basic 75 - 89% of the time/requires cueing 10 - 24% of the time  Expression Expression Mode: Verbal Expression: 3-Expresses basic 50 - 74% of the time/requires cueing 25 - 50% of the time. Needs to repeat parts of sentences.  Social Interaction Social Interaction: 3-Interacts appropriately 50 - 74% of the time - May be physically or verbally inappropriate.  Problem Solving Problem Solving: 3-Solves basic 50 - 74% of the time/requires cueing 25 - 49% of the time  Memory Memory: 3-Recognizes or recalls 50 - 74% of the time/requires cueing 25 - 49% of the time   Medical Problem List and Plan: 1. Functional deficits secondary to L-AKA due to severe PVD with gangrenous changes.   -spoke to son today at length regarding condition  -favor standard wheelchair first---can make decision about mobility device once home---probably most appropriate for powered w/c 2. H/o PE/DVT/Anticoagulation: Pharmaceutical: Coumadin 3. Pain Management: limit oxycodone due mental status 4. Mood: LCSW to follow for evaluation and support.  5. Neuropsych: This patient is not capable of making decisions on his own behalf. 6. Skin/Wound Care: Monitor wound daily for healing. Routine pressure relief measures.   -stump sock  -prevalon boot right foot 7. Fluids/Electrolytes/Nutrition:encourage PO intake.  8. ABLA:  iron supplement added. hgb back tup to 8.4 --follow up in am.  9. Non-obstructive CAD: On BB and statin.  10. Dementia: Continue Aricept. Delirium resolved---need with narcotics/neurosedating meds 11. CKD stage IV: Improved with hydration. Monitor for now.  12. Combined systolic and diastolic BWL:[id number], nitrates and statin.   -weight 65kg  -resumed low dose lasix 13. BPH- On Flomax.   -check ua,cx given low grade temp  LOS (Days) 4 A FACE TO FACE EVALUATION WAS PERFORMED  SWARTZ,ZACHARY T 10/22/2014  8:15 AM

## 2014-10-22 NOTE — Progress Notes (Signed)
Occupational Therapy Session Note  Patient Details  Name: Scott [name] MRN: [id number] Date of Birth: Mar 17, 1922  Today's Date: 10/22/2014 OT Individual Time: 2952-8413 and 1300-1400 OT Individual Time Calculation (min): 60 min and 60 min   Short Term Goals: Week 1:  OT Short Term Goal 1 (Week 1): Pt. will performing groooming at SBA OT Short Term Goal 2 (Week 1): Pt will perform bathing with min assist OT Short Term Goal 3 (Week 1): Pt will perform dressing with min assist OT Short Term Goal 4 (Week 1): Pt will perform troilet transfer with mod assist OT Short Term Goal 5 (Week 1): Pt will perform shower transfer with mod assist  Skilled Therapeutic Interventions/Progress Updates:    Session One: Pt seen for OT ADL bathing and dressing session. Pt in supine upon arrival, agreeable to tx. Pt completed bed mobility with supervision and VCs for technique. He bathed seated on EOB with supervision and VCs/demonstration provided for use of lateral leans to complete buttock hygiene. Step stool placed under pt's R foot for easier access to wash. Pt able to thread B LEs into pants with encouragement. 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 Encouragement and empathetic listening provided. Pt stood with max A at RW to have pants pulled up with total A. Pt requested return to bed at end of session, completing bed mobility with cues for technique. Pt left in supine at end of session, bed alarm set, and all needs in reach.  ACE wrap donned on R LE per MD order for edema management, education provided to pt regarding purpose of ACE bandage, pt voiced understanding and stating that wrapping "felt good".  Pt requested rest breaks throughout and cues to be alert and initiate throughout.  Pt educated regarding energy conservation, modified dressing techniques, and d/c planning.   Session Two: Pt seen for OT  session focusing on ADL retraining and functional transfers. Pt in w/c upon arrival, voicing no pain and agreeable to tx. Pt completed stand pivot toilet transfer from w/c> toilet with max A standing at grab bars. Pt required cues for technique of stand pivot transfer. Pt required total A for toileting task with +2 assist required to pull pants up as pt required increased assist to stand and for balance due to fatigue. Pt completed hand hygiene from w/c level at sink. He then completed sit <> stand at end of bed  With mod A to stand and CGA once standing with B UE support. He required min steadying assist when standing with L UE as stabilizer and mod-max steadying with R UE. Pt progressed to standing at RW with same assist level as above. Pt demonstrated ability to stabilize with L UE and touch stomach and back with R UE for simulated dressing task. Pt returned to bed at end of session, completing stand pivot at RW with min A. In supine, pt completed L leg lifts x10 reps and hip ABduction x10 reps. Pt left in supine at end of session, bed alarm on , and all needs in reach.       Therapy Documentation Precautions:  Precautions Precautions: Fall Precaution Comments: contact Restrictions Weight Bearing Restrictions: Yes LLE Weight Bearing: Non weight bearing Pain: Pain Assessment Pain Assessment: 0-10 Pain Score: 3  Pain Type: Acute pain Pain Location: Leg Pain Orientation: Right;Left Pain Descriptors / Indicators: Aching Pain Frequency: Intermittent Pain Onset: On-going Patients  Stated Pain Goal: 3 Pain Intervention(s): Medication (See eMAR);Repositioned Multiple Pain Sites: No  See FIM for current functional status  Therapy/Group: Individual Therapy  Lewis, Mikaelyn Arthurs C 10/22/2014, 7:17 AM

## 2014-10-22 NOTE — Progress Notes (Signed)
ANTICOAGULATION CONSULT NOTE - Follow Up Consult  Pharmacy Consult for Warfarin Indication: Hx PE/DVT  Allergies  Allergen Reactions  . Lipitor [Atorvastatin] Other (See Comments)    unknown    Patient Measurements: Weight: 144 lb 2.9 oz (65.4 kg)  Vital Signs: Temp: 99.4 F (37.4 C) (07/12 0535) Temp Source: Oral (07/12 0535) BP: 145/57 mmHg (07/12 0535) Pulse Rate: 64 (07/12 0535)  Labs:  Recent Labs  10/20/14 0500 10/21/14 0520 10/22/14 0650  HGB 8.4*  --   --   HCT 25.8*  --   --   PLT 358  --   --   LABPROT 24.5* 26.0* 28.8*  INR 2.23* 2.41* 2.77*    Estimated Creatinine Clearance: 44.5 mL/min (by C-G formula based on Cr of 1).   Assessment: 89 YOM who continues on warfarin for hx DVT/PE - held for AKA on 7/6 and resumed post-op. INR this morning is 2.77, remains therapeutic. Goal of 2-3. Last Hgb 8.4 on 10/20/14, up from 7.6, plts wnl. No CBC today. S/p recent L AKA on 10/16/14 due to severe PVD with gangrenous changes, thus risk of bleeding from stump site.  MD notes that patient denies any bleeding.  No overt s/sx of bleeding noted per EPIC chart review.  PTA dose 5 mg daily EXCEPT for 7.5 mg on Mon/Fri. The patient has been educated during this admission.    Goal of Therapy:  INR 2-3   Plan:  1. Warfarin 5 mg x 1 dose at 1800 today 2. Will continue to monitor for any signs/symptoms of bleeding and will follow up with PT/INR in the a.m.    Nicole Cella, RPh Clinical Pharmacist Pager: [phone] 10/22/2014 2:13 PM

## 2014-10-22 NOTE — Progress Notes (Signed)
Physical Therapy Note  Patient Details  Name: Scott [name] MRN: [id number] Date of Birth: May 08, 1921 Today's Date: 10/22/2014    Pt's plan of care adjusted to a 15/7 schedule after speaking with care team and discussed with MD in team conference as pt currently unable to tolerate current therapy schedule with OT and PT.     Juanna Cao, PT, DPT  10/22/2014, 2:56 PM

## 2014-10-23 ENCOUNTER — Inpatient Hospital Stay (HOSPITAL_COMMUNITY): Payer: Medicare Other | Admitting: Occupational Therapy

## 2014-10-23 ENCOUNTER — Inpatient Hospital Stay (HOSPITAL_COMMUNITY): Payer: Medicare Other

## 2014-10-23 LAB — URINE CULTURE

## 2014-10-23 LAB — PROTIME-INR
INR: 2.68 — ABNORMAL HIGH (ref 0.00–1.49)
Prothrombin Time: 28.1 seconds — ABNORMAL HIGH (ref 11.6–15.2)

## 2014-10-23 MED ORDER — WARFARIN SODIUM 5 MG PO TABS
5.0000 mg | ORAL_TABLET | ORAL | Status: AC
  Administered 2014-10-23: 5 mg via ORAL
  Filled 2014-10-23: qty 1

## 2014-10-23 MED ORDER — WARFARIN SODIUM 7.5 MG PO TABS: 7.5000 mg | ORAL_TABLET | ORAL | Status: DC

## 2014-10-23 NOTE — Progress Notes (Signed)
Social Work Assessment and Plan Patient Details  Name: Scott [name] MRN: [id number] Date of Birth: 03-Oct-1921  Today's Date: 10/21/2014  Problem List:  Patient Active Problem List   Diagnosis Date Noted  . Status post above knee amputation of left lower extremity 10/18/2014  . Tachyarrhythmia 10/05/2014  . Aspiration pneumonia 10/05/2014  . UTI (lower urinary tract infection) 10/03/2014  . Sepsis due to other etiology 10/03/2014  . Gangrene of toe 10/03/2014  . Ischemic ulcer of left foot 10/03/2014  . Atherosclerotic PVD with ulceration 10/03/2014  . Warfarin-induced coagulopathy 10/03/2014  . Atrial fibrillation with RVR 10/03/2014  . Acute kidney injury 10/03/2014  . Hyperkalemia 10/03/2014  . Cellulitis of left leg 10/03/2014  . PAD (peripheral artery disease) 09/24/2014  . NSTEMI (non-ST elevated myocardial infarction) 10/09/2013  . High risk medication use 05/17/2013  . BPH (benign prostatic hyperplasia) 05/17/2013  . Vitamin D deficiency 05/17/2013  . History of pulmonary embolism (2014) 04/17/2013  . NSVT (nonsustained ventricular tachycardia)- not an ICD candidate 04/17/2013  . NICM (nonischemic cardiomyopathy)- EF 20-25% by Echo 04/12/13 04/17/2013  . Chronic diastolic heart failure, NYHA class 1 03/15/2013  . DVT of lower extremity, bilateral 03/15/2013  . Fatigue 11/02/2012  . Constipation 11/02/2012  . Alcohol abuse 08/27/2012  . Syncope 06/21/2012  . Bladder cancer 03/15/2012  . Benign localized hyperplasia of prostate with urinary obstruction 03/15/2012  . Benign essential HTN   . Hyperlipidemia   . PVCs (premature ventricular contractions)   . CAD- non obstructive disease by cath 3/14    Past Medical History:  Past Medical History  Diagnosis Date  . HTN (hypertension)   . Hyperlipidemia   . PVCs (premature ventricular contractions)   . Glaucoma   . PVD (peripheral vascular disease)     Right ABI .75, Left .78 (2006)  . Popliteal artery aneurysm,  bilateral DECUMENTED CHRONIC PARTIAL OCCLUSION--  PT DENIES CLAUDICATION OR ANY OTHER SYMPTOMS  . Frequency of urination   . Nocturia   . DVT, bilateral lower limbs 03/2013    a. Bilat DVT dx 03/2013.  . Pulmonary embolism     a. By CT angio 04/2013.  Marland Kitchen Elevated troponin     a. Adm 12/30-04/2013 - not felt to represent ACS; ?due to PE. b. Patent cors 06/2012.  Marland Kitchen NSVT (nonsustained ventricular tachycardia)     a. NSVT 06/2012; NSVT also seen during 03/2013 adm. b. Med rx. Not candidate for ICD given adv age.  Marland Kitchen ETOH abuse   . Syncope     a. Felt to be postural syncope related to diuretics 06/2012.  Marland Kitchen Heart murmur   . DVT (deep venous thrombosis)     "I think in both legs"  . Pneumonia     "couple times"  . DDD (degenerative disc disease)   . Arthritis     "all over"  . Depression   . CAD- non obstructive disease by cath 3/14   . PAD (peripheral artery disease)   . Gangrene of toe 10/03/2014  . Sepsis due to other etiology 10/03/2014  . Chronic systolic heart failure 5/42/70    EF improved 50-55%  . Chronic systolic CHF (congestive heart failure)     a. NICM - patent cors 06/2012, EF 40% at that time. b. 03/2013 eval: EF 20-25%.  . Chronic lower back pain   . Bladder cancer 03/15/2012    pt denies this hx on 10/15/2014   Past Surgical History:  Past Surgical History  Procedure Laterality Date  .  Transthoracic echocardiogram  09-22-2010    MODERATE LVH/ EF 58%/ GRADE I DIASTOLIC DYSFUNCTION/ AORTIC SCLEROSIS WITHOUT STENOSIS/  RV  SYSTOLIC  MILDLY REDUCED FUNCTION  . Cardiovascular stress test  10-15-2010    LOW RISK NUCLEAR STUDY/ NO EVIDENCE OF ISCHEMIA/ NORMAL EF  . Shoulder surgery  1970's    "don't remember which side or what kind of OR"  . Transurethral resection of bladder tumor  10/07/2011    Procedure: TRANSURETHRAL RESECTION OF BLADDER TUMOR (TURBT);  Surgeon: Malka So, MD;  Location: [facility];  Service: Urology;  Laterality: N/A;  . Cystoscopy   10/07/2011    Procedure: CYSTOSCOPY;  Surgeon: Malka So, MD;  Location: [facility];  Service: Urology;  Laterality: N/A;  . Cystoscopy with biopsy  03/14/2012    Procedure: CYSTOSCOPY WITH BIOPSY;  Surgeon: Malka So, MD;  Location: [facility];  Service: Urology;  Laterality: N/A;  WITH FULGURATION  . Transurethral resection of prostate  03/14/2012    Procedure: TRANSURETHRAL RESECTION OF THE PROSTATE WITH GYRUS INSTRUMENTS;  Surgeon: Malka So, MD;  Location: [facility];  Service: Urology;  Laterality: N/A;  . Tonsillectomy    . Cataract extraction w/ intraocular lens  implant, bilateral Bilateral   . Cardiac catheterization  05-11-2004  DR [place] PLAQUE/ NORMAL LVF/ EF 55%/  NON-OBSTRUCTIVE LAD 25%  . Cardiac catheterization  06/2012  . Eye surgery    . Amputation Left 10/16/2014    Procedure: AMPUTATION ABOVE KNEE- LEFT;  Surgeon: Mal Misty, MD;  Location: [facility];  Service: Vascular;  Laterality: Left;   Social History:  reports that he quit smoking about 39 years ago. His smoking use included Cigarettes. He started smoking about 72 years ago. He has a 20 pack-year smoking history. He has never used smokeless tobacco. He reports that he does not drink alcohol or use illicit drugs.  Family / Support Systems Marital Status: Married Patient Roles: Parent, Spouse Spouse/Significant Other: wife, Mikle Bosworth Crabbe(H) [phone] (she is in the home with 24/7 caregiver due to her own medical issues) Children: son, Jhoan Schmieder [facility]) @ [phone] Other Supports: dtr-in-law, Dr. Jamie Kato @ (C) [phone] Anticipated Caregiver: Has 24/7 caregivers at home Ability/Limitations of Caregiver: son lives close by but does work.  Caregivers are in the home providing assistance to pt's wife, however, they are discussion adding responsibilities for pt as well. Caregiver Availability: 24/7  Social History Preferred language: English Religion:  [employer] Cultural Background: NA Education: college Read: Yes Write: Yes Employment Status: Retired Freight forwarder Issues: None Guardian/Conservator: None - per MD, pt not yet capable of making decisions on his own behalf - defer to son   Abuse/Neglect Physical Abuse: Denies Verbal Abuse: Denies Sexual Abuse: Denies Exploitation of patient/patient's resources: Denies Self-Neglect: Denies  Emotional Status Pt's affect, behavior adn adjustment status: Frail, elderly gentleman seated in w/c and hanging head down upon entry.  Looks up when spoken to but little eye contact.  Answers questions with very short answers and not able to engage him in any conversations.  Denies any emotional distress.  Admits he is fatigued from therapies.  Will monitor behavior and mood during stay and refer to neurpsychology for formal depression screen as deemed appropriate. Recent Psychosocial Issues: Wife's medical issues/ paraplegia have been primary stressor the past few years per son.   Pyschiatric History: None Substance Abuse History: per chart, note h/o ETOH abuse -   Patient /  Family Perceptions, Expectations & Goals Pt/Family understanding of illness & functional limitations: Pt with basic understanding of the medical issue leading to need for AKA.  Basic understanding of the need for tx.  Son with good understanding of medical issues, functional limitations and anticipated care needs upon d/c. Premorbid pt/family roles/activities: Pt had only recently begun using a w/c at home.  Private duty caregivers for wife.  Son provides "oversight" to parents living conditions/ needs. Anticipated changes in roles/activities/participation: Son considering how much more assist private caregiver may need to provide as it is clear pt will now need assistance as well. Pt/family expectations/goals: Pt states, "I don't know" regarding goals.  Sone hopeful that pt will not require much physical assistance as  this will affect the cost of care.  Community Resources [employer]: Other (Comment) ([place] for community CM) Premorbid Home Care/DME Agencies: Other (Comment) ([facility] PTA) Transportation available at discharge: yes Resource referrals recommended: Neuropsychology  Discharge Planning Living Arrangements: Spouse/significant other, Other (Comment) (24 hr private caregiver) Support Systems: Children, Home care staff Type of Residence: Private residence Insurance Resources: [employer], Multimedia programmer (specify) Nurse, mental health) Financial Resources: [place] Referred: No Living Expenses: Own Money Management: Family Does the patient have any problems obtaining your medications?: No Home Management: pt and private duty personnel Patient/Family Preliminary Plans: Son hopeful that pt will be able to return home and have minimal assist added from private duty caregiver for him. Social Work Anticipated Follow Up Needs: HH/OP, SNF Expected length of stay: 14 days  Clinical Impression Frail, elderly gentleman here following a left AKA.  Brief responses only to assessment questions and could not engage in further conversation.  Son involved and supportive and working to increase private duty intensity to provide care to pt in addition to his wife.  Will follow for support and d/c planning needs.  Gerrie Castiglia 10/21/2014, 8:52 AM

## 2014-10-23 NOTE — Progress Notes (Signed)
[facility] PHYSICAL MEDICINE & REHABILITATION     PROGRESS NOTE    Subjective/Complaints: Pain controlled. Says he slept well. No breathing problems.  ROS: Pt denies fever, rash/itching, headache, blurred or double vision, nausea, vomiting, abdominal pain, diarrhea, chest pain, shortness of breath, palpitations, dysuria, dizziness, neck or back pain, bleeding, anxiety, or depression   Objective: Vital Signs: Blood pressure 131/58, pulse 63, temperature 98.6 F (37 C), temperature source Oral, resp. rate 17, weight 57.3 kg (126 lb 5.2 oz), SpO2 97 %. No results found. No results for input(s): WBC, HGB, HCT, PLT in the last 72 hours. No results for input(s): NA, K, CL, GLUCOSE, BUN, CREATININE, CALCIUM in the last 72 hours.  Invalid input(s): CO CBG (last 3)  No results for input(s): GLUCAP in the last 72 hours.  Wt Readings from Last 3 Encounters:  10/23/14 57.3 kg (126 lb 5.2 oz)  10/18/14 61.2 kg (134 lb 14.7 oz)  10/07/14 70.6 kg (155 lb 10.3 oz)    Physical Exam:  Constitutional: He is oriented to person, place, and time. He appears well-developed and well-nourished.  HENT: dentition poor. Mucosa fairly moist Head: Normocephalic and atraumatic.  Eyes: Conjunctivae are normal. Pupils are equal, round, and reactive to light.  Neck: Normal range of motion. Neck supple.  Cardiovascular: Normal rate and regular rhythm.no murmur Respiratory: Effort normal and breath sounds normal. No respiratory distress. He has no wheezes.  GI: Soft. Bowel sounds are normal. He exhibits no distension. There is no tenderness.  Musculoskeletal: He exhibits edema.  RLE with 1+edema ----stable to improved Neurological: He is alert and oriented to person, place, and time. Improved insight and awareness. UE 4/5 prox to distal. RLE: 3/5 hf, ke and 4/5 adf/apf, LLE: 3/5 HF,  Skin: Skin is warm and dry. Incision c/d/i  Assessment/Plan: 1. Functional deficits secondary to left AKA which  require 15 hours over 7 days/week of interdisciplinary therapy in a comprehensive inpatient rehab setting. Intensity adjusted to better accommodate the patient's activity tolerance. Physiatrist is providing close team supervision and 24 hour management of active medical problems listed below. Physiatrist and rehab team continue to assess barriers to discharge/monitor patient progress toward functional and medical goals. FIM: FIM - Bathing Bathing Steps Patient Completed: Chest, Right Arm, Left Arm, Abdomen, Right upper leg, Left upper leg, Right lower leg (including foot) Bathing: 3: Mod-Patient completes 5-7 70f 10 parts or 50-74%  FIM - Upper Body Dressing/Undressing Upper body dressing/undressing steps patient completed: Thread/unthread right sleeve of pullover shirt/dresss, Thread/unthread left sleeve of pullover shirt/dress, Put head through opening of pull over shirt/dress Upper body dressing/undressing: 5: Set-up assist to: Obtain clothing/put away FIM - Lower Body Dressing/Undressing Lower body dressing/undressing steps patient completed: Thread/unthread left pants leg Lower body dressing/undressing: 1: Total-Patient completed less than 25% of tasks  FIM - Musician Devices: Grab bar or rail for support Toileting: 1: Total-Patient completed zero steps, helper did all 3  FIM - Radio producer Devices: Grab bars Toilet Transfers: 2-To toilet/BSC: Max A (lift and lower assist), 2-From toilet/BSC: Max A (lift and lower assist), 1-Two helpers  FIM - Control and instrumentation engineer Devices: Arm rests Bed/Chair Transfer: 5: Supine > Sit: Supervision (verbal cues/safety issues), 3: Bed > Chair or W/C: Mod A (lift or lower assist)  FIM - Locomotion: Wheelchair Distance: 150 Locomotion: Wheelchair: 5: Travels 150 ft or more: maneuvers on rugs and over door sills with supervision, cueing or coaxing FIM - Locomotion:  Ambulation Ambulation/Gait  Assistance: 1: +2 Total assist Locomotion: Ambulation: 0: Activity did not occur  Comprehension Comprehension Mode: Auditory Comprehension: 4-Understands basic 75 - 89% of the time/requires cueing 10 - 24% of the time  Expression Expression Mode: Verbal Expression: 3-Expresses basic 50 - 74% of the time/requires cueing 25 - 50% of the time. Needs to repeat parts of sentences.  Social Interaction Social Interaction: 3-Interacts appropriately 50 - 74% of the time - May be physically or verbally inappropriate.  Problem Solving Problem Solving: 3-Solves basic 50 - 74% of the time/requires cueing 25 - 49% of the time  Memory Memory: 3-Recognizes or recalls 50 - 74% of the time/requires cueing 25 - 49% of the time   Medical Problem List and Plan: 1. Functional deficits secondary to L-AKA due to severe PVD with gangrenous changes.   -spoke to son yesterday  -favor standard wheelchair first---can make decision about mobility device once home---probably most appropriate for powered w/c  -may need to seek lower intensity rehab venue depending upon how he does this week 2. H/o PE/DVT/Anticoagulation: Pharmaceutical: Coumadin 3. Pain Management: limit oxycodone due mental status 4. Mood: LCSW to follow for evaluation and support.  5. Neuropsych: This patient is not capable of making decisions on his own behalf. 6. Skin/Wound Care: Monitor wound daily for healing. Routine pressure relief measures.   -stump sock as tolerated  -prevalon boot right foot needs to be worn while in bed 7. Fluids/Electrolytes/Nutrition:encourage PO intake.  8. ABLA:  iron supplement added. hgb back tup to 8.4 --follow up labs as available  9. Non-obstructive CAD: On BB and statin.  10. Dementia: Continue Aricept. Delirium improved---avoid excessive narcotics/neurosedating meds 11. CKD stage IV: Improved with hydration. Monitor for now.  12. Combined systolic and diastolic  KMQ:[id number], nitrates and statin.   -weight 65kg yesterday---don't believe weight today  -resumed low dose lasix 13. BPH- On Flomax.   -ua neg, ucx pending  LOS (Days) 5 A FACE TO FACE EVALUATION WAS PERFORMED  Kamal Jurgens T 10/23/2014 8:13 AM

## 2014-10-23 NOTE — Progress Notes (Signed)
Physical Therapy Session Note  Patient Details  Name: Scott [name] MRN: [id number] Date of Birth: July 04, 1921  Today's Date: 10/23/2014 PT Individual Time: 1415-1530 PT Individual Time Calculation (min): 75 min   Short Term Goals: Week 1:  PT Short Term Goal 1 (Week 1): Pt will demonstrate supine to sit bed mobility req min A.  PT Short Term Goal 2 (Week 1): Pt will demonstrate squat-pivot transfers req min A.  PT Short Term Goal 3 (Week 1): Pt will propel manual w/c >= 150' req SBA.  PT Short Term Goal 4 (Week 1): Pt will ambulate 25' req min A.  PT Short Term Goal 5 (Week 1): Pt will demonstrate rolling in bed with SBA/verbal cues.     Skilled Therapeutic Interventions/Progress Updates:  tx focused on bed mobility, transfers, toilet transfers, w/c propulsion in home setting, sit><stand, gait, caregiver ed.  In raised bed, supine>sit with supervision, mod cues.  From raised bed, sit><stand with RW mod assist, pushing up with L hand, R hand on RW.  Pt stood x 1 minute x 2 with min guard assist, mod cues for upright posture.  Pt hopped x 3' toward w/c with mod assist; caregiver Phineas Real had to bring w/c up quickly behind him.  As he fatigues, pt unable to continue to pivot R foot. Sit> stand at sink with mod assist.  Pt stood x 2.5 min with min guard assist while naming and arranging grooming items on back of sink.   Therapeutic exercise performed with LE to increase strength for functional mobility: seated R long arc quad knee ext with ankle pumps at end range, resisted bil hip add/abd, R calf raises, 10 x 1 each.  Pt stated he needed to use toilet to urinate.  See FIM. Pt required max assist for pants on L residual limb, with 1 UE support on wall bar.  W/c propulsion in room to/from toilet using bil UEs and RLE, supervision; cues to use brakes each time he stopped for activity.  Pt exhausted; requested resting in recliner.  Stand pivot to R with RW, max assist to stand as well as sit.  Quick  release belt applied and all needs left within reach.  LaToya assisted pt with sit> stand and cues needed for pt to optimize R foot position and upright posture. She observed toilet and recliner transfers, and gait.     Therapy Documentation Precautions:  Precautions Precautions: Fall Precaution Comments: contact Restrictions Weight Bearing Restrictions: Yes LLE Weight Bearing: Non weight bearing   Pain: Pain Assessment No pain reported   Locomotion : Ambulation Ambulation/Gait Assistance: 3: Mod assist Wheelchair Mobility Distance: 15    See FIM for current functional status  Therapy/Group: Individual Therapy  Ennis Delpozo 10/23/2014, 3:37 PM

## 2014-10-23 NOTE — Care Management Note (Signed)
[facility] Individual Statement of Services  Patient Name:  Scott [name]  Date:  10/21/2014  Welcome to the [facility].  Our goal is to provide you with an individualized program based on your diagnosis and situation, designed to meet your specific needs.  With this comprehensive rehabilitation program, you will be expected to participate in at least 3 hours of rehabilitation therapies Monday-Friday, with modified therapy programming on the weekends.  Your rehabilitation program will include the following services:  Physical Therapy (PT), Occupational Therapy (OT), 24 hour per day rehabilitation nursing, Therapeutic Recreaction (TR), Neuropsychology, Case Management (Social Worker), Rehabilitation Medicine, Nutrition Services and Pharmacy Services  Weekly team conferences will be held on Tuesdays to discuss your progress.  Your Social Worker will talk with you frequently to get your input and to update you on team discussions.  Team conferences with you and your family in attendance may also be held.  Expected length of stay: 14 days  Overall anticipated outcome: minimal assistance  Depending on your progress and recovery, your program may change. Your Social Worker will coordinate services and will keep you informed of any changes. Your Social Worker's name and contact numbers are listed  below.  The following services may also be recommended but are not provided by the [facility] will be made to provide these services after discharge if needed.  Arrangements include referral to agencies that provide these services.  Your insurance has been verified to be:  Medicare and [place] Your primary doctor is:  Dr. Laurance Flatten  Pertinent information will be shared with your doctor and your insurance company.  Social Worker:  [place], [place] or ([phone]   Information discussed with and copy given to patient by: Lennart Pall, 10/21/2014, 11:10 AM

## 2014-10-23 NOTE — Progress Notes (Signed)
Occupational Therapy Session Note  Patient Details  Name: Scott [name] MRN: [id number] Date of Birth: [birth year]  Today's Date: 10/23/2014 OT Individual Time: 0930-1030 and 1300-1340 OT Individual Time Calculation (min): 60 min and 40 min   Short Term Goals: Week 1:  OT Short Term Goal 1 (Week 1): Pt. will performing groooming at SBA OT Short Term Goal 2 (Week 1): Pt will perform bathing with min assist OT Short Term Goal 3 (Week 1): Pt will perform dressing with min assist OT Short Term Goal 4 (Week 1): Pt will perform troilet transfer with mod assist OT Short Term Goal 5 (Week 1): Pt will perform shower transfer with mod assist  Skilled Therapeutic Interventions/Progress Updates:    Session One: Pt seen for OT ADL bathing and dressing session. Pt in supine upon arrival, voicing fatigue, however, agreeable to tx.Pt transferred to EOB with VCs for technique. He completed squat pivot transfer to w/c with mod A. Pt declined full showering activity, opting instead for bathing at the sink. Pt completed UB bathing with supervision. Assist provided for buttock hygiene in standing. Pt's R UE placed on step stool for easier access when washing R LE.Assist provided for clothing management to thread R LE into pants, and required +2 assist when standing to have pants pulled up.  Pt stood at sink with max A and VCs for hand placement, unable to achieve full erect posture. Sit <> stand at sink completed x4 throughout session, pt tolerating ~15 seconds during longest standing trial before requesting seated rest break. Pt noted to have abrasion on R butt cheek, RN made aware and barrier cream applied. ACE bandage applied to pt's R LE per MD order.  Pt returned to bed at end of session with mod A stand pivot transfer from RW, air boot placed on pt's R foot, bed alarm on, and all needs in reach.  Pt required rest breaks throughout session and increased VCs for arousal.   NT present for portion of session to assist  with LB dressing, toileting transfer techniques discussed with NT for transfers outside of therapy time.  Pt educated regarding energy conservation, transfer techniques, phantom pain, deep breathing techniques, purpose of air cast and ACE bandage and d/c planning.   Session Two: Pt seen for OT session focusing on functional transfers. Pt in supine upon arrival with personal caregiver present. Pt transferred to EOB using hospital bed functions with VCs for technique. Pt with decreased sitting balance on EOB, likely due to fatigue. Pt completed simulated toilet transfers to [facility]. He completed first transfer via stand pivot at RW to standard [facility] with min-mod A and VCs for technique. Seated on BSC, education provided regarding lateral leans for clothing management. With increased times and cues, pt able to initate pulling down LB clothing while seated on BSC, however, did not fully pull pants down. Pt returned to bed in similar manner as described above. Pt and caregiver educated regarding difference btwn standard BSC and drop-arm BSC with pros and cons of each one presented. Pt then completed squat pivot transfer to [facility] with +2 assist available for safety. Pt then completed lateral leans, leaning further onto bed in order for hip to come up for clothing management, able to easier manage clothing. Pt voiced preferring stand pivot transfers onto toilet, however, liked the drop arm feature for easier lateral leans. Pt returned to bed at end of session with increased assist due to fatigue. Pt left in supine at end of session with nursing  notified for hygiene to be completed.  Pt and caregiver educated regarding drop arm BSC vs standard BSC, multiple uses of 3-1 BSC, energy conservation, reducing caregiver burden, and d/c planning.   Therapy Documentation Precautions:  Precautions Precautions: Fall Precaution Comments: contact Restrictions Weight Bearing Restrictions: Yes LLE Weight Bearing: Non weight  bearing Pain: Pain Assessment Faces Pain Scale: Hurts a little bit Pain Type: Acute pain Pain Location: Leg Pain Orientation: Right Pain Descriptors / Indicators: Aching Pain Intervention(s): Repositioned;Ambulation/increased activity  See FIM for current functional status  Therapy/Group: Individual Therapy  Lewis, Aidan Caloca C 10/23/2014, 7:18 AM

## 2014-10-23 NOTE — Progress Notes (Addendum)
ANTICOAGULATION CONSULT NOTE - Follow Up Consult  Pharmacy Consult for Warfarin Indication: Hx PE/DVT  Allergies  Allergen Reactions  . Lipitor [Atorvastatin] Other (See Comments)    unknown    Patient Measurements: Weight: 146 lb 2.6 oz (66.3 kg)  Vital Signs: Temp: 98.6 F (37 C) (07/13 0526) Temp Source: Oral (07/13 0526) BP: 131/58 mmHg (07/13 0526) Pulse Rate: 63 (07/13 0526)  Labs:  Recent Labs  10/21/14 0520 10/22/14 0650 10/23/14 0600  LABPROT 26.0* 28.8* 28.1*  INR 2.41* 2.77* 2.68*    Estimated Creatinine Clearance: 45.1 mL/min (by C-G formula based on Cr of 1).   Assessment: 64 YOM who continues on warfarin for hx DVT/PE - held for AKA on 7/6 and resumed post-op. INR today is 2.68. INR down from 2.77 yesterday. INR therapeutic x 4 days on mostly 5mg  daily.  Goal of 2-3. S/p recent L AKA on 10/16/14 due to severe PVD with gangrenous changes. ABLA: on iron supplement, last Hgb 8.4 on 10/20/14, up from 7.6, plts wnl. No CBC today.  MD notes that patient denies any bleeding.  No overt s/sx of bleeding noted per EPIC chart review.  PTA dose 5 mg daily EXCEPT for 7.5 mg on Mon/Fri. The patient has been educated during this admission.    Goal of Therapy:  INR 2-3   Plan:  Will resume home dose:  Warfarin 5 mg daily except 7.5 mg qMon & Friday (5mg  due today) Change PT/INR to q48h, next due on Fri 10/25/14. Will continue to monitor for any signs/symptoms of bleeding.   Nicole Cella, RPh Clinical Pharmacist Pager: [phone] 10/23/2014 12:39 PM

## 2014-10-24 ENCOUNTER — Inpatient Hospital Stay (HOSPITAL_COMMUNITY): Payer: Medicare Other | Admitting: Occupational Therapy

## 2014-10-24 ENCOUNTER — Inpatient Hospital Stay (HOSPITAL_COMMUNITY): Payer: Medicare Other

## 2014-10-24 MED ORDER — WARFARIN SODIUM 7.5 MG PO TABS
7.5000 mg | ORAL_TABLET | ORAL | Status: DC
  Administered 2014-10-25 – 2014-10-28 (×2): 7.5 mg via ORAL
  Filled 2014-10-24 (×2): qty 1

## 2014-10-24 MED ORDER — WARFARIN SODIUM 5 MG PO TABS
5.0000 mg | ORAL_TABLET | ORAL | Status: DC
  Administered 2014-10-24 – 2014-10-27 (×3): 5 mg via ORAL
  Filled 2014-10-24 (×4): qty 1

## 2014-10-24 NOTE — Progress Notes (Signed)
[facility] PHYSICAL MEDICINE & REHABILITATION     PROGRESS NOTE    Subjective/Complaints: Denies issues. Pain controlled. Able to sleep well. Feels therapy went better yesterday  ROS: Pt denies fever, rash/itching, headache, blurred or double vision, nausea, vomiting, abdominal pain, diarrhea, chest pain, shortness of breath, palpitations, dysuria, dizziness, neck or back pain, bleeding, anxiety, or depression   Objective: Vital Signs: Blood pressure 129/48, pulse 62, temperature 98.2 F (36.8 C), temperature source Oral, resp. rate 16, weight 62.9 kg (138 lb 10.7 oz), SpO2 99 %. No results found. No results for input(s): WBC, HGB, HCT, PLT in the last 72 hours. No results for input(s): NA, K, CL, GLUCOSE, BUN, CREATININE, CALCIUM in the last 72 hours.  Invalid input(s): CO CBG (last 3)  No results for input(s): GLUCAP in the last 72 hours.  Wt Readings from Last 3 Encounters:  10/24/14 62.9 kg (138 lb 10.7 oz)  10/18/14 61.2 kg (134 lb 14.7 oz)  10/07/14 70.6 kg (155 lb 10.3 oz)    Physical Exam:  Constitutional: He is oriented to person, place, and time. He appears well-developed and well-nourished.  HENT: dentition poor. Mucosa fairly moist Head: Normocephalic and atraumatic.  Eyes: Conjunctivae are normal. Pupils are equal, round, and reactive to light.  Neck: Normal range of motion. Neck supple.  Cardiovascular: Normal rate and regular rhythm.no murmur Respiratory: Effort normal and breath sounds normal. No respiratory distress. He has no wheezes.  GI: Soft. Bowel sounds are normal. He exhibits no distension. There is no tenderness.  Musculoskeletal: He exhibits edema.  RLE with trace to  1+edema ----stable to improved Neurological: He is alert and oriented to person, place, and time. Improved insight and awareness. UE 4/5 prox to distal. RLE: 3/5 hf, ke and 4/5 adf/apf, LLE: 3/5 HF,  Skin: Skin is warm and dry. Incision c/d/i. No breakdown RLE---heel may be a  little soft  Assessment/Plan: 1. Functional deficits secondary to left AKA which require 15 hours over 7 days/week of interdisciplinary therapy in a comprehensive inpatient rehab setting. Intensity adjusted to better accommodate the patient's activity tolerance. Physiatrist is providing close team supervision and 24 hour management of active medical problems listed below. Physiatrist and rehab team continue to assess barriers to discharge/monitor patient progress toward functional and medical goals. FIM: FIM - Bathing Bathing Steps Patient Completed: Chest, Right Arm, Left Arm, Abdomen, Right upper leg, Left upper leg, Right lower leg (including foot) Bathing: 3: Mod-Patient completes 5-7 22f 10 parts or 50-74%  FIM - Upper Body Dressing/Undressing Upper body dressing/undressing steps patient completed: Thread/unthread right sleeve of pullover shirt/dresss, Thread/unthread left sleeve of pullover shirt/dress, Put head through opening of pull over shirt/dress Upper body dressing/undressing: 5: Set-up assist to: Obtain clothing/put away FIM - Lower Body Dressing/Undressing Lower body dressing/undressing steps patient completed: Thread/unthread left pants leg Lower body dressing/undressing: 1: Two helpers  FIM - Musician Devices: Grab bar or rail for support Toileting: 1: Total-Patient completed zero steps, helper did all 3  FIM - Radio producer Devices: Grab bars Toilet Transfers: 2-From toilet/BSC: Max A (lift and lower assist), 2-To toilet/BSC: Max A (lift and lower assist)  FIM - Control and instrumentation engineer Devices: Arm rests, Copy: 3: Bed > Chair or W/C: Mod A (lift or lower assist), 3: Chair or W/C > Bed: Mod A (lift or lower assist)  FIM - Locomotion: Wheelchair Distance: 15 Locomotion: Wheelchair: 1: Travels less than 50 ft with supervision, cueing or coaxing  FIM - Locomotion:  Ambulation Locomotion: Ambulation Assistive Devices: Walker - Rolling Ambulation/Gait Assistance: 3: Mod assist Locomotion: Ambulation: 1: Travels less than 50 ft with moderate assistance (Pt: 50 - 74%)  Comprehension Comprehension Mode: Auditory Comprehension: 4-Understands basic 75 - 89% of the time/requires cueing 10 - 24% of the time  Expression Expression Mode: Verbal Expression: 3-Expresses basic 50 - 74% of the time/requires cueing 25 - 50% of the time. Needs to repeat parts of sentences.  Social Interaction Social Interaction: 4-Interacts appropriately 75 - 89% of the time - Needs redirection for appropriate language or to initiate interaction.  Problem Solving Problem Solving: 3-Solves basic 50 - 74% of the time/requires cueing 25 - 49% of the time  Memory Memory: 3-Recognizes or recalls 50 - 74% of the time/requires cueing 25 - 49% of the time   Medical Problem List and Plan: 1. Functional deficits secondary to L-AKA due to severe PVD with gangrenous changes.   -spoke to son about w/c options  -may need to seek lower intensity rehab venue depending upon how he does this week---check with team--did a little better yesterday 2. H/o PE/DVT/Anticoagulation: Pharmaceutical: Coumadin 3. Pain Management: limit oxycodone due mental status 4. Mood: LCSW to follow for evaluation and support.  5. Neuropsych: This patient is not capable of making decisions on his own behalf. 6. Skin/Wound Care: Monitor wound daily for healing. Routine pressure relief measures.   -stump sock as tolerated---doesn't have yet---will order  -prevalon boot right foot needs to be worn while in bed 7. Fluids/Electrolytes/Nutrition:encourage PO intake.  8. ABLA:  iron supplement added. hgb back tup to 8.4 --follow up labs tomorrow 9. Non-obstructive CAD: On BB and statin.BP/HR clinically within range 10. Dementia: Continue Aricept. Delirium improved---avoid excessive narcotics/neurosedating meds 11.  CKD stage IV: Improved with hydration. Monitor for now.  12. Combined systolic and diastolic GEX:[id number], nitrates and statin.   -weight 63-66kg  -resumed low dose lasix  -recheck lytes tomorrow 13. BPH- On Flomax.   -ua neg, ucx reviewed: only multispecies  LOS (Days) 6 A FACE TO FACE EVALUATION WAS PERFORMED  SWARTZ,ZACHARY T 10/24/2014 8:17 AM

## 2014-10-24 NOTE — Progress Notes (Signed)
Orthopedic Tech Progress Note Patient Details:  Scott [name] Dec 29, 1921 [id number] Brace order completed by [employer] vendor. Patient ID: Scott [name], male   DOB: Oct 30, 1921, 81 y.o.   MRN: [id number]   Braulio Bosch 10/24/2014, 4:04 PM

## 2014-10-24 NOTE — Progress Notes (Signed)
Occupational Therapy Session Note  Patient Details  Name: Scott [name] MRN: [id number] Date of Birth: September 24, 1921  Today's Date: 10/24/2014 OT Individual Time: 1100-1200 and 1300-1330 OT Individual Time Calculation (min): 60 min and 30 min   Short Term Goals: Week 1:  OT Short Term Goal 1 (Week 1): Pt. will performing groooming at SBA OT Short Term Goal 2 (Week 1): Pt will perform bathing with min assist OT Short Term Goal 3 (Week 1): Pt will perform dressing with min assist OT Short Term Goal 4 (Week 1): Pt will perform troilet transfer with mod assist OT Short Term Goal 5 (Week 1): Pt will perform shower transfer with mod assist  Skilled Therapeutic Interventions/Progress Updates:    Session One: Pt seen for OT ADL bathing and dressing session. NT entering room as therapist entered as pt with incontinent episode while in supine, requiring assist for hygiene. Pt transferred to EOB with min A using hospital bed functions. He transferred to [facility] using RW and stand pivot transfer with increased assist needed to decreased alertness and impaired motor planning for transfer. Pt unable to recall practicing toiilet transfes during yesterday's OT session. Pt bathed seated on BSC, using step stool under R LE to accss foot for bathing task. Pt demonstrated good functional sitting balance and endurance during bathing task. With VCs for technique, pt completed lateral leans to R side onto bed to complete buttock hygiene, able to clean self thoroughly. Pt stood at [facility] with +2 assist provided to pull pants up completely. Pt completed stand pivot transfer from BSC> w/c with RW and max A of 1.  He required cues for clothing management when dressing UB. Total A provided for LB dressing due to fatigue. Pt left sitting in w/c at end of session, all needs in reach, and son present.  Pt's son present at end of session. Education provided regarding OT goals, and discussion regarding whether to direct therapy towards  increasing independence vs. Decreasing caregiver burden, change of focus for OT if pt plans to d/c to SNF and discussed POC.   Session Two: Pt seen for OT session focusing on functional standing balance, functional activity tolerance, and LE strengthening. Pt in w/c upon arrival, having just finished lunch voicing fatigue and desire to return to bed, however, agreeable to tx session. Pt's personal "sitter" present throughout session. Pt completed 2x sit <> stand with mod A to stand and min-CGA to stand at [facility] with B UE support and max VCs for upright standing posture. Pt tolerated ~15 seconds of static standing during each trial before requesting seated rest break. Pt then completed oral care seated at the sink with min VCs for task. Pt completed LE strengthening exercises from w/c and supine level, including leg lifts, knee ext, and hip ABduction each x10 reps, pt fatigues quickly requiring rest breaks throughout and VCs for proper form and technique. Pt transferred w/c> EOB via squat pivot with mod A, and returned to supine with min A. Pt left in supine at end of session, all needs in reach. Pt educated regarding upright standing posture, phantom pain (pt denies phantom pain), deep breathing techniques, decreasing caregiver burden, and d/c planning.   Therapy Documentation Precautions:  Precautions Precautions: Fall Precaution Comments: contact Restrictions Weight Bearing Restrictions: Yes LLE Weight Bearing: Non weight bearing Pain: Pain Assessment Faces Pain Scale: Hurts little more Pain Type: Acute pain Pain Location: Leg Pain Orientation: Right Pain Descriptors / Indicators: Aching Pain Intervention(s): Ambulation/increased activity;Repositioned  See FIM for  current functional status  Therapy/Group: Individual Therapy  Lewis, Gustavus Haskin C 10/24/2014, 7:11 AM

## 2014-10-24 NOTE — Progress Notes (Signed)
Social Work Patient ID: Scott [name], male   DOB: [birth year], 81 y.o.   MRN: [id number]   Able to meet yesterday with pt and with son today to review team conference.  Pt does not really engage other than to state, "OK" when informed of targeted date and goals. Son aware and agreeable with date and goals, however, reports he is "leaning towards" idea of SNF.  He feel this will allow pt more time to progress to a lower level of assistance.  He would like for pt to remain here "as long as he can".  I have discussed need to begin bed search next week.  Will continue to follow.  Xaniyah Buchholz, LCSW

## 2014-10-24 NOTE — Progress Notes (Signed)
Orthopedic Tech Progress Note Patient Details:  Scott [name] 16-Mar-1922 [id number]  Patient ID: [name], male   DOB: Dec 11, 1921, 81 y.o.   MRN: [id number] Called in advanced brace order; spoke with [name], [name] 10/24/2014, 9:21 AM

## 2014-10-24 NOTE — Progress Notes (Signed)
Physical Therapy Session Note  Patient Details  Name: Scott [name] MRN: [id number] Date of Birth: Oct 23, 1921  Today's Date: 10/24/2014 PT Individual Time: 9458-5929 PT Individual Time Calculation (min): 57 min   Short Term Goals: Week 1:  PT Short Term Goal 1 (Week 1): Pt will demonstrate supine to sit bed mobility req min A.  PT Short Term Goal 2 (Week 1): Pt will demonstrate squat-pivot transfers req min A.  PT Short Term Goal 3 (Week 1): Pt will propel manual w/c >= 150' req SBA.  PT Short Term Goal 4 (Week 1): Pt will ambulate 25' req min A.  PT Short Term Goal 5 (Week 1): Pt will demonstrate rolling in bed with SBA/verbal cues.   Skilled Therapeutic Interventions/Progress Updates:   Session focused on functional transfers using squat pivot technique with steady A and mod verbal cues for set-up and w/c parts management, w/c propulsion training for functional strengthening and endurance at S level, sit to stands and therapeutic standing balance activity with Rw for 1 UE support while performing functional reaching/fine motor task x 2 min before seated rest break needed with minA x 2 trials, gait training with RW with min A x 3' x 2 reps with close w/c follow for safety, and functional bed mobility on flat surface to simulate home environment with S/min A level needed. End of session left in bed with bed alarm on and pt's family caregiver present during session and in room with pt at end of session.   Therapy Documentation Precautions:  Precautions Precautions: Fall Precaution Comments: contact Restrictions Weight Bearing Restrictions: Yes LLE Weight Bearing: Non weight bearing   Pain: Denies pain initially. C/o pain in residual limb when back to bed - RN made aware.  See FIM for current functional status  Therapy/Group: Individual Therapy  Canary Brim Ivory Broad, PT, DPT  10/24/2014, 3:31 PM

## 2014-10-25 ENCOUNTER — Inpatient Hospital Stay (HOSPITAL_COMMUNITY): Payer: Medicare Other | Admitting: Occupational Therapy

## 2014-10-25 ENCOUNTER — Encounter: Payer: Medicare Other | Admitting: Vascular Surgery

## 2014-10-25 ENCOUNTER — Inpatient Hospital Stay (HOSPITAL_COMMUNITY): Payer: Medicare Other | Admitting: Rehabilitation

## 2014-10-25 ENCOUNTER — Encounter (HOSPITAL_COMMUNITY): Payer: Medicare Other

## 2014-10-25 ENCOUNTER — Inpatient Hospital Stay (HOSPITAL_COMMUNITY): Payer: Medicare Other

## 2014-10-25 LAB — BASIC METABOLIC PANEL
Anion gap: 5 (ref 5–15)
BUN: 18 mg/dL (ref 6–20)
CO2: 28 mmol/L (ref 22–32)
Calcium: 9.3 mg/dL (ref 8.9–10.3)
Chloride: 107 mmol/L (ref 101–111)
Creatinine, Ser: 1.01 mg/dL (ref 0.61–1.24)
GFR calc Af Amer: >60 mL/min (ref >60)
GFR calc non Af Amer: >60 mL/min (ref >60)
Glucose, Bld: 114 mg/dL — ABNORMAL HIGH (ref 65–99)
Potassium: 3.7 mmol/L (ref 3.5–5.1)
Sodium: 140 mmol/L (ref 135–145)

## 2014-10-25 LAB — CBC
HCT: 24.3 % — ABNORMAL LOW (ref 39.0–52.0)
Hemoglobin: 7.8 g/dL — ABNORMAL LOW (ref 13.0–17.0)
MCH: 26.7 pg (ref 26.0–34.0)
MCHC: 32.1 g/dL (ref 30.0–36.0)
MCV: 83.2 fL (ref 78.0–100.0)
Platelets: 310 10*3/uL (ref 150–400)
RBC: 2.92 MIL/uL — ABNORMAL LOW (ref 4.22–5.81)
RDW: 14.9 % (ref 11.5–15.5)
WBC: 6.1 10*3/uL (ref 4.0–10.5)

## 2014-10-25 LAB — PROTIME-INR
INR: 2.43 — ABNORMAL HIGH (ref 0.00–1.49)
Prothrombin Time: 26.2 seconds — ABNORMAL HIGH (ref 11.6–15.2)

## 2014-10-25 NOTE — Progress Notes (Signed)
Physical Therapy Session Note  Patient Details  Name: Scott [name] MRN: [id number] Date of Birth: 03-Aug-1921  Today's Date: 10/25/2014 PT Individual Time: 0900-1000 PT Individual Time Calculation (min): 60 min   Short Term Goals: Week 1:  PT Short Term Goal 1 (Week 1): Pt will demonstrate supine to sit bed mobility req min A.  PT Short Term Goal 2 (Week 1): Pt will demonstrate squat-pivot transfers req min A.  PT Short Term Goal 3 (Week 1): Pt will propel manual w/c >= 150' req SBA.  PT Short Term Goal 4 (Week 1): Pt will ambulate 25' req min A.  PT Short Term Goal 5 (Week 1): Pt will demonstrate rolling in bed with SBA/verbal cues.   Skilled Therapeutic Interventions/Progress Updates:   Pt received lying in bed, restless and attempting to get to EOB.  Cues for safety and that PT would assist in getting OOB.  Pt agreeable to therapy session.  Once at EOB, had pt perform lateral leans in order to assist with donning shrinker for L residual limb.  Note area of skin breakdown on R side of buttocks, therefore called RN to look at at end of session to apply dressing.  Pt stating needing to use restroom, therefore performed squat pivot transfer to drop arm bedside commode at min A level with cues for increased forward trunk lean.  Pt successful in voiding urine on bedside commode.  Assisted with peri care to ensure cleanliness and performed standing at min/mod A level while PT assisted with pulling pants/brief up. Transferred to bedside with RW at min A level.  Then transferred to w/c via squat pivot at min A level.  Pt self propelled to/from therapy gym x 100' x 2 reps at S level with BUEs for overall strengthening and activity tolerance.  Once in therapy gym, assess gait with use of RW x 10' x 1 and another 45' x 1 at min A level (+2A for chair follow).  Cues for upright posture and looking up with increased step length.  Ended session with supine therex as follows; supine SLR BLE x 10 reps, SL hip abd x  10 reps LLE, SL hip ext LLE x 10 reps, and supine L hip ext into pillow x 10 reps.  Transferred back to w/c and propelled to room.  Assisted with standing at min A level while RN applied dressing to buttocks.  Left in w/c with QRB donned and all needs in reach.   Therapy Documentation Precautions:  Precautions Precautions: Fall Precaution Comments: contact Restrictions Weight Bearing Restrictions: Yes LLE Weight Bearing: Non weight bearing   Vital Signs: Therapy Vitals Pulse Rate: 71 BP: (!) 141/62 mmHg Pain: Pain Assessment Pain Assessment: 0-10 Pain Score: 6  Faces Pain Scale: Hurts little more Pain Location: Leg Pain Orientation: Right Pain Descriptors / Indicators: Aching Pain Intervention(s): Medication (See eMAR)   Locomotion : Ambulation Ambulation/Gait Assistance: 4: Min assist;1: +2 Total assist Wheelchair Mobility Distance: 100   See FIM for current functional status  Therapy/Group: Individual Therapy  Denice Bors 10/25/2014, 12:27 PM

## 2014-10-25 NOTE — Progress Notes (Signed)
Occupational Therapy Weekly Progress Note  Patient Details  Name: Scott [name] MRN: [id number] Date of Birth: 06/13/1921  Beginning of progress report period: October 18, 2014 End of progress report period: October 25, 2014  Today's Date: 10/25/2014 OT Individual Time: 1100-1200 OT Individual Time Calculation (min): 60 min    Patient has met 5 of 5 short term goals.  Pt requires min- max levels of assist due to varying levels of fatigue and alertness.  Patient continues to demonstrate the following deficits: muscle weakness (generalized), difficulty with functional transfers, decreased functional endurance, decreased functional standing and sitting balance and pain in jointand therefore will continue to benefit from skilled OT intervention to enhance overall performance with BADL and Reduce care partner burden.  Patient now expecting to d/[name] to SNF. LTGs modified, see POC for goal revision.   OT Short Term Goals Week 1:  OT Short Term Goal 1 (Week 1): Pt. will performing groooming at SBA OT Short Term Goal 1 - Progress (Week 1): Met OT Short Term Goal 2 (Week 1): Pt will perform bathing with min assist OT Short Term Goal 2 - Progress (Week 1): Met OT Short Term Goal 3 (Week 1): Pt will perform UB dressing with min assist OT Short Term Goal 3 - Progress (Week 1): Met OT Short Term Goal 4 (Week 1): Pt will perform troilet transfer with mod assist OT Short Term Goal 4 - Progress (Week 1): Not met OT Short Term Goal 5 (Week 1): Pt will perform shower transfer with mod assist OT Short Term Goal 5 - Progress (Week 1): Not met Week 2:  OT Short Term Goal 1 (Week 2): LTG=STG  Skilled Therapeutic Interventions/Progress Updates:    Pt seen for OT therapy focusing on ADL re-training. Pt in w/[name] upon arrival, voicing increased discomfort, however, unable to state what was wrong. Pt agreeable to attempt showering task. Pt completed stand pivot transfer from w/[name]> shower chair using grab bar with mod A and  exited shower in same manner with min A. Pt bathed seated on shower chair with close supervision, able to bend down to reach foot to wash. Pt stood with min A for buttock hygiene to be performed. Pt dressed seated in w/[name] at the sink, able to thread B LEs into pants and assist provided to pull pants up. Pt demonstrated functional standing balance at sink with B UE support for care to be performed. Pt requested return to bed due to fatigue. In supine and sidelying, pt completed L LE exercises working on increasing ROM and strength. Pt stated "I have no control over that left leg", during exercises. Pt able to complete forward flexion exercises, however, unable to extend at L hip, suspect due to inability to follow VCs despite demonstration. Pt left in side lying at end of session, bed alarm on and personal sitter present. RN made aware of pt's discomfort.  Therapy Documentation Precautions:  Precautions Precautions: Fall Precaution Comments: contact Restrictions Weight Bearing Restrictions: Yes LLE Weight Bearing: Non weight bearing Pain: Pain Assessment Faces Pain Scale: Hurts little more Pain Location: Leg Pain Orientation: Right Pain Descriptors / Indicators: Aching Pain Intervention(s): Repositioned;Ambulation/increased activity;Shower; RN made aware  See FIM for current functional status  Therapy/Group: Individual Therapy  Scott [name] 10/25/2014, 7:18 AM

## 2014-10-25 NOTE — Plan of Care (Signed)
Plan of Care Revision Pt's LTG revised due to pt's expected d/c to SNF. See plan of care for goal revisions. - Mabell Esguerra Lewis, OTR/L

## 2014-10-25 NOTE — Progress Notes (Signed)
ANTICOAGULATION CONSULT NOTE - Follow Up Consult  Pharmacy Consult for coumadin Indication: hx of DVT/PE  Allergies  Allergen Reactions  . Lipitor [Atorvastatin] Other (See Comments)    unknown    Patient Measurements: Weight: 138 lb 10.7 oz (62.9 kg) Heparin Dosing Weight:   Vital Signs: Temp: 98.6 F (37 C) (07/15 0544) Temp Source: Oral (07/15 0544) BP: 138/55 mmHg (07/15 0544) Pulse Rate: 67 (07/15 0544)  Labs:  Recent Labs  10/23/14 0600 10/25/14 0522  HGB  --  7.8*  HCT  --  24.3*  PLT  --  310  LABPROT 28.1* 26.2*  INR 2.68* 2.43*    Estimated Creatinine Clearance: 42.8 mL/min (by C-G formula based on Cr of 1).   Medications:  Scheduled:  . donepezil  10 mg Oral QHS  . febuxostat  40 mg Oral Daily  . feeding supplement (ENSURE ENLIVE)  237 mL Oral BID BM  . folic acid  1 mg Oral Daily  . furosemide  20 mg Oral Daily  . gabapentin  200 mg Oral BID  . hydrocerin   Topical BID  . iron polysaccharides  150 mg Oral Daily  . isosorbide mononitrate  15 mg Oral Daily  . metoprolol succinate  50 mg Oral Daily  . pantoprazole  40 mg Oral Daily  . simvastatin  10 mg Oral Daily  . tamsulosin  0.4 mg Oral QPC supper  . thiamine  100 mg Oral Daily  . warfarin  5 mg Oral Once per day on Sun Tue Wed Thu Sat  . warfarin  7.5 mg Oral Once per day on Mon Fri  . Warfarin - Pharmacist Dosing Inpatient   Does not apply q1800   Infusions:    Assessment: 81 yo male with hx of DVT/PE is currently on therapeutic coumadin.  INR today is 2.43 Goal of Therapy:  INR 2-3 Monitor platelets by anticoagulation protocol: Yes   Plan:  Cont Warfarin 5 mg daily except 7.5 mg qMon & Friday  INR q 48h (next Sunday) Will continue to monitor for any signs/symptoms of bleeding    Lashuna Tamashiro, Tsz-Yin 10/25/2014,8:27 AM

## 2014-10-25 NOTE — Progress Notes (Signed)
[facility]     PROGRESS NOTE    Subjective/Complaints: Slept well. Denies substantial stump pain. Struggles with therapy intensity  ROS: Pt denies fever, rash/itching, headache, blurred or double vision, nausea, vomiting, abdominal pain, diarrhea, chest pain, shortness of breath, palpitations, dysuria, dizziness, neck or back pain, bleeding, anxiety, or depression   Objective: Vital Signs: Blood pressure 138/55, pulse 67, temperature 98.6 F (37 C), temperature source Oral, resp. rate 18, weight 62.9 kg (138 lb 10.7 oz), SpO2 98 %. No results found.  Recent Labs  10/25/14 0522  WBC 6.1  HGB 7.8*  HCT 24.3*  PLT 310   No results for input(s): NA, K, CL, GLUCOSE, BUN, CREATININE, CALCIUM in the last 72 hours.  Invalid input(s): CO CBG (last 3)  No results for input(s): GLUCAP in the last 72 hours.  Wt Readings from Last 3 Encounters:  10/25/14 62.9 kg (138 lb 10.7 oz)  10/18/14 61.2 kg (134 lb 14.7 oz)  10/07/14 70.6 kg (155 lb 10.3 oz)    Physical Exam:  Constitutional: He is oriented to person, place, and time. He appears well-developed and well-nourished.  HENT: dentition poor. Mucosa fairly moist Head: Normocephalic and atraumatic.  Eyes: Conjunctivae are normal. Pupils are equal, round, and reactive to light.  Neck: Normal range of motion. Neck supple.  Cardiovascular: Normal rate and regular rhythm.no murmur Respiratory: Effort normal and breath sounds normal. No respiratory distress. He has no wheezes.  GI: Soft. Bowel sounds are normal. He exhibits no distension. There is no tenderness.  Musculoskeletal: He exhibits edema.  RLE with trace to  1+edema ----stable to improved Neurological: He is alert and oriented to person, place, and time. Improved insight and awareness. UE 4/5 prox to distal. RLE: 3/5 hf, ke and 4/5 adf/apf, LLE: 3/5 HF,  Skin: Skin is warm and dry. Incision c/d/i. No breakdown RLE---heel may be a  little soft  Assessment/Plan: 1. Functional deficits secondary to left AKA which require 15 hours over 7 days/week of interdisciplinary therapy in a comprehensive inpatient rehab setting. Intensity adjusted to better accommodate the patient's activity tolerance. Physiatrist is providing close team supervision and 24 hour management of active medical problems listed below. Physiatrist and rehab team continue to assess barriers to discharge/monitor patient progress toward functional and medical goals. FIM: FIM - Bathing Bathing Steps Patient Completed: Chest, Right Arm, Left Arm, Abdomen, Right upper leg, Left upper leg, Right lower leg (including foot) Bathing: 3: Mod-Patient completes 5-7 77f 10 parts or 50-74%  FIM - Upper Body Dressing/Undressing Upper body dressing/undressing steps patient completed: Thread/unthread right sleeve of pullover shirt/dresss, Thread/unthread left sleeve of pullover shirt/dress, Put head through opening of pull over shirt/dress, Pull shirt over trunk Upper body dressing/undressing: 5: Supervision: Safety issues/verbal cues FIM - Lower Body Dressing/Undressing Lower body dressing/undressing steps patient completed: Thread/unthread left pants leg Lower body dressing/undressing: 1: Two helpers  FIM - Toileting Toileting steps completed by patient: Performs perineal hygiene Toileting Assistive Devices: Grab bar or rail for support Toileting: 2: Max-Patient completed 1 of 3 steps  FIM - Radio producer Devices: Engineer, civil (consulting), Insurance account manager Transfers: 2-To toilet/BSC: Max A (lift and lower assist), 2-From toilet/BSC: Max A (lift and lower assist)  FIM - Control and instrumentation engineer Devices: Arm rests, Bed rails Bed/Chair Transfer: 5: Supine > Sit: Supervision (verbal cues/safety issues), 4: Sit > Supine: Min A (steadying pt. > 75%/lift 1 leg), 4: Bed > Chair or W/C: Min A (steadying Pt. >  75%), 4: Chair or W/C >  Bed: Min A (steadying Pt. > 75%)  FIM - Locomotion: Wheelchair Distance: 15 Locomotion: Wheelchair: 2: Travels 8 - 149 ft with supervision, cueing or coaxing FIM - Locomotion: Ambulation Locomotion: Ambulation Assistive Devices: Administrator Ambulation/Gait Assistance: 4: Min assist, 1: +2 Total assist (+2 for close w/c follow) Locomotion: Ambulation: 1: Two helpers  Comprehension Comprehension Mode: Auditory Comprehension: 4-Understands basic 75 - 89% of the time/requires cueing 10 - 24% of the time  Expression Expression Mode: Verbal Expression: 3-Expresses basic 50 - 74% of the time/requires cueing 25 - 50% of the time. Needs to repeat parts of sentences.  Social Interaction Social Interaction: 4-Interacts appropriately 75 - 89% of the time - Needs redirection for appropriate language or to initiate interaction.  Problem Solving Problem Solving: 3-Solves basic 50 - 74% of the time/requires cueing 25 - 49% of the time  Memory Memory: 3-Recognizes or recalls 50 - 74% of the time/requires cueing 25 - 49% of the time   Medical Problem List and Plan: 1. Functional deficits secondary to L-AKA due to severe PVD with gangrenous changes.   -spoke to son about w/c options  -may need to seek lower intensity rehab venue depending upon how he does this week---check with team--did a little better yesterday 2. H/o PE/DVT/Anticoagulation: Pharmaceutical: Coumadin---continue for now---level therapeutic 3. Pain Management: limit oxycodone due mental status 4. Mood: LCSW to follow for evaluation and support.  5. Neuropsych: This patient is not capable of making decisions on his own behalf. 6. Skin/Wound Care: Monitor wound daily for healing. Routine pressure relief measures.   -stump sock as tolerated---doesn't have yet---will order  -prevalon boot right foot while in bed 7. Fluids/Electrolytes/Nutrition:encourage PO intake.  8. ABLA:  iron supplement added. hgb back down to 7.8 (has  ranged from 7.6-8.4)---no outward signs of blood loss  -will heme check stool x1.   -follow up labs monday 9. Non-obstructive CAD: On BB and statin.BP/HR clinically within range 10. Dementia: Continue Aricept. Delirium improved---avoid excessive narcotics/neurosedating meds 11. CKD stage IV: Improved with hydration. Monitor for now.  12. Combined systolic and diastolic ZGY:FVCBSWHQPR, nitrates and statin.   -weight 63-66kg  -resumed low dose lasix  -bmet pending today 13. BPH- On Flomax.   -ua neg, ucx reviewed: only multispecies  LOS (Days) 7 A FACE TO FACE EVALUATION WAS PERFORMED  Susana Gripp T 10/25/2014 8:32 AM

## 2014-10-25 NOTE — Progress Notes (Signed)
Physical Therapy Weekly Progress Note  Patient Details  Name: Scott [name] MRN: [id number] Date of Birth: 1921-07-11  Beginning of progress report period: October 18, 2014 End of progress report period: October 25, 2014  Today's Date: 10/25/2014 PT Individual Time: 1405-1505 PT Individual Time Calculation (min): 60 min  Session focused on functional bed mobility on flat surface with S and extra time, basic transfers performing squat pivot and stand pivot transfer with RW with focus on hand placement and technique, sit to stands from mat with min A/close S with cues for L hand on RW and R hand on mat with min A for power up at times needed, standing therex for LLE including hip extension and hip flexion for functional strengthening and ROM, seated core exercises with green weighted medicine ball to aid with mobility and transfers as well as upright posture including PNF diagonals and trunk rotation x 10 reps each each direction for each exercise, functional w/c mobility training and w/c parts management with little carryover for parts management from previous sessions, gait training with RW for functional strengthening x 17' with min A and +2 for close w/c follow due to quick need to sit, and practiced simulated car transfer in preparation for d/c planning at mod A level (due to uneven surface and required A with RLE into the car). Pt's hired caregiver, [name], present and observed session.    Patient has met 3 of 4 short term goals.  Pt is making good progress towards goals at overall S/min A w/c level. Pt is making progress with standing tolerance and standing activities including gait. Pt limited by low activity tolerance, weakness, and cognition. Recommending 24/7 supervision upon d/c due to fluctuating cognitive state which also impacts physical A due to decreased motor planning. Plan is currently for SNF due to increased burden of care in the home with 24/7 needs. Pt is benefiting from 15/7 therapy  schedule.   Patient continues to demonstrate the following deficits: impaired strength, impaired balance, decreased endurance/activity tolerance, decreased ROM, impaired cognition and therefore will continue to benefit from skilled PT intervention to enhance overall performance with activity tolerance, balance, postural control, ability to compensate for deficits, awareness and coordination.  Patient progressing toward long term goals..  Continue plan of care.  PT Short Term Goals Week 1:  PT Short Term Goal 1 (Week 1): Pt will demonstrate supine to sit bed mobility req min A.  PT Short Term Goal 1 - Progress (Week 1): Met PT Short Term Goal 2 (Week 1): Pt will demonstrate squat-pivot transfers req min A.  PT Short Term Goal 2 - Progress (Week 1): Met PT Short Term Goal 3 (Week 1): Pt will propel manual w/c >= 150' req SBA.  PT Short Term Goal 3 - Progress (Week 1): Met PT Short Term Goal 4 (Week 1): Pt will ambulate 25' req min A.  PT Short Term Goal 4 - Progress (Week 1): Not met PT Short Term Goal 5 (Week 1): Pt will demonstrate rolling in bed with SBA/verbal cues.  PT Short Term Goal 5 - Progress (Week 1): Met Week 2:  PT Short Term Goal 1 (Week 2): = LTGs  Skilled Therapeutic Interventions/Progress Updates:  Ambulation/gait training;Cognitive remediation/compensation;Discharge planning;DME/adaptive equipment instruction;Functional mobility training;Pain management;Psychosocial support;Splinting/orthotics;Therapeutic Activities;UE/LE Strength taining/ROM;Balance/vestibular training;Community reintegration;Disease management/prevention;Neuromuscular re-education;Patient/family education;Skin care/wound management;Therapeutic Exercise;UE/LE Coordination activities;Wheelchair propulsion/positioning   Therapy Documentation Precautions:  Precautions Precautions: Fall Precaution Comments: contact Restrictions Weight Bearing Restrictions: Yes LLE Weight Bearing: Non weight  bearing  Pain: Denies pain.  See FIM for current functional status  Therapy/Group: Individual Therapy  Canary Brim Ivory Broad, PT, DPT  10/25/2014, 3:18 PM

## 2014-10-26 ENCOUNTER — Inpatient Hospital Stay (HOSPITAL_COMMUNITY): Payer: Medicare Other | Admitting: Physical Therapy

## 2014-10-26 ENCOUNTER — Inpatient Hospital Stay (HOSPITAL_COMMUNITY): Payer: Medicare Other

## 2014-10-26 LAB — OCCULT BLOOD X 1 CARD TO LAB, STOOL: Fecal Occult Bld: NEGATIVE

## 2014-10-26 NOTE — Progress Notes (Signed)
Physical Therapy Session Note  Patient Details  Name: Scott [name] MRN: [id number] Date of Birth: 19-Nov-1921  Today's Date: 10/26/2014 PT Individual Time: 0830-0900 PT Individual Time Calculation (min): 30 min   Short Term Goals: Week 1:  PT Short Term Goal 1 (Week 1): Pt will demonstrate supine to sit bed mobility req min A.  PT Short Term Goal 1 - Progress (Week 1): Met PT Short Term Goal 2 (Week 1): Pt will demonstrate squat-pivot transfers req min A.  PT Short Term Goal 2 - Progress (Week 1): Met PT Short Term Goal 3 (Week 1): Pt will propel manual w/c >= 150' req SBA.  PT Short Term Goal 3 - Progress (Week 1): Met PT Short Term Goal 4 (Week 1): Pt will ambulate 25' req min A.  PT Short Term Goal 4 - Progress (Week 1): Not met PT Short Term Goal 5 (Week 1): Pt will demonstrate rolling in bed with SBA/verbal cues.  PT Short Term Goal 5 - Progress (Week 1): Met  Skilled Therapeutic Interventions/Progress Updates:  Pt was seen bedside in the am, sitting on edge of bed eating breakfast. Pt refused to participate with therapy until after breakfast completed. Attempted to encourage participation without success. Returned once breakfast completed, pt willing to participate with therapy. Pt transferred edge of bed to supine with bed rail and S with increased time. Donned L AKA shrinker in supine. Pt transferred supine to edge of bed with bed rail and S. Pt transferred edge of bed to w/c squat pivot with min guard to min A and verbal cues. Pt propelled w/c about 100 feet with B UEs and S. Pt performed multiple sit to stand transfers with rolling walker and min A. Pt ambulated with rolling walker and min A for 10 and 15 feet. Pt propelled w/c abot 150 feet with S and B UEs. Pt returned to room and left sitting up in w/c with quick release belt in place and call bell within reach.   Therapy Documentation Precautions:  Precautions Precautions: Fall Precaution Comments:  contact Restrictions Weight Bearing Restrictions: Yes LLE Weight Bearing: Non weight bearing General: PT Amount of Missed Time (min): 30 Minutes PT Missed Treatment Reason: Patient unwilling to participate;Other (Comment) (pt refused to participate with therapy until after pt completed breakfast)  Pain: Pt c/o mild pain L residual limb.    Locomotion : Ambulation Ambulation/Gait Assistance: 4: Min assist   See FIM for current functional status  Therapy/Group: Individual Therapy  Fremon, Zacharia 10/26/2014, 12:28 PM

## 2014-10-26 NOTE — Progress Notes (Signed)
Occupational Therapy Session Note  Patient Details  Name: Scott [name] MRN: [id number] Date of Birth: 08-19-1921  Today's Date: 10/26/2014 OT Individual Time: 1100-1158 OT Individual Time Calculation (min): 58 min    Short Term Goals: Week 2: OT Short Term Goal 1 (Week 2): LTG= STG  Skilled Therapeutic Interventions/Progress Updates:    Pt seen for 1:1 OT session with a focus on ADL retraining, functional transfers, sit<>stand, UE exercises, activity tolerance, and safety awareness. Pt received supine in bed agreeable to participate in ADL session. Pt completed bed mobility with supervision and transfer from EOB>w/c via RW with min A. Pt engaged in bathing/dressing sit<>stand from w/c at sink level. Pt required min A for sit<>stand during ADL and required mod A for LB dressing particularly for pulling up pants. Pt then propelled w/c for apprx 100' to therapy gym with supervision. Pt engaged in UE functional strengthening exercises seated in w/c 3 sets of 10 for 3 exercises. Pt then completed squat-pivot transfer with min A to therapy mat. Pt engaged in trunk rotation 10 reps x3 with medicine ball. Pt transferred EOM>w/c via squat pivot with min A. Pt transitioned back to room and left seated in w/c with QRB donned and all other needs within reach.   Therapy Documentation Precautions:  Precautions Precautions: Fall Precaution Comments: contact Restrictions Weight Bearing Restrictions: Yes LLE Weight Bearing: Non weight bearing General:   Vital Signs:  Pain: Pain Assessment Pain Score: 5  ADL:   Exercises:   Other Treatments:    See FIM for current functional status  Therapy/Group: Individual Therapy  Dorann Ou 10/26/2014, 12:03 PM

## 2014-10-26 NOTE — Progress Notes (Signed)
Scott [name] is a 81 y.o. male 01-16-1922 [id number]  Subjective: No new complaints. Would like copy of today's newspaper - no other concerns. Slept well. Feeling OK.  Objective: Vital signs in last 24 hours: Temp:  [98.3 F (36.8 C)-98.6 F (37 C)] 98.3 F (36.8 C) (07/16 0531) Pulse Rate:  [56-65] 56 (07/16 0531) Resp:  [16] 16 (07/16 0531) BP: (122-148)/(38-48) 148/48 mmHg (07/16 0531) SpO2:  [99 %-100 %] 100 % (07/16 0531) Weight:  [64.3 kg (141 lb 12.1 oz)] 64.3 kg (141 lb 12.1 oz) (07/16 0531) Weight change: 1.4 kg (3 lb 1.4 oz) Last BM Date: 10/24/14  Intake/Output from previous day: 07/15 0701 - 07/16 0700 In: 720 [P.O.:720] Out: 500 [Urine:500]  Physical Exam General: No apparent distress  In [facility] at desk, reading yesterday's paper  Lungs: Normal effort. Lungs clear to auscultation, no crackles or wheezes. Cardiovascular: Regular rate and rhythm Neurological: No new neurological deficits Wounds:  Clean, dry, intact. No signs of infection.  Lab Results: BMET    Component Value Date/Time   NA 140 10/25/2014 0840   NA 139 08/26/2014 1620   K 3.7 10/25/2014 0840   CL 107 10/25/2014 0840   CO2 28 10/25/2014 0840   GLUCOSE 114* 10/25/2014 0840   GLUCOSE 112* 08/26/2014 1620   BUN 18 10/25/2014 0840   BUN 45* 08/26/2014 1620   CREATININE 1.01 10/25/2014 0840   CREATININE 0.91 11/02/2012 1310   CALCIUM 9.3 10/25/2014 0840   GFRNONAA >60 10/25/2014 0840   GFRNONAA 74 11/02/2012 1310   GFRAA >60 10/25/2014 0840   GFRAA 86 11/02/2012 1310   CBC    Component Value Date/Time   WBC 6.1 10/25/2014 0522   WBC 4.6 07/30/2014 1402   WBC 4.7 03/26/2014 1446   WBC 3.5* 01/13/2011 1349   RBC 2.92* 10/25/2014 0522   RBC 4.20* 07/30/2014 1402   RBC 4.14 03/26/2014 1446   RBC 3.57* 01/13/2011 1349   HGB 7.8* 10/25/2014 0522   HGB 10.7* 07/30/2014 1402   HGB 11.5* 01/13/2011 1349   HCT 24.3* 10/25/2014 0522   HCT 36.0* 07/30/2014 1402   HCT 33.7* 01/13/2011 1349   PLT 310 10/25/2014 0522   PLT 207 01/13/2011 1349   MCV 83.2 10/25/2014 0522   MCV 85.7 07/30/2014 1402   MCV 94.3 01/13/2011 1349   MCH 26.7 10/25/2014 0522   MCH 25.5* 07/30/2014 1402   MCH 27.8 03/26/2014 1446   MCH 32.2 01/13/2011 1349   MCHC 32.1 10/25/2014 0522   MCHC 29.8* 07/30/2014 1402   MCHC 32.6 03/26/2014 1446   MCHC 34.1 01/13/2011 1349   RDW 14.9 10/25/2014 0522   RDW 15.6* 03/26/2014 1446   RDW 17.4* 01/13/2011 1349   LYMPHSABS 1.0 10/19/2014 0510   LYMPHSABS 1.3 03/26/2014 1446   LYMPHSABS 0.5* 01/13/2011 1349   MONOABS 0.7 10/19/2014 0510   MONOABS 0.4 01/13/2011 1349   EOSABS 0.2 10/19/2014 0510   EOSABS 0.1 03/26/2014 1446   BASOSABS 0.0 10/19/2014 0510   BASOSABS 0.0 03/26/2014 1446   BASOSABS 0.0 01/13/2011 1349   CBG's (last 3):  No results for input(s): GLUCAP in the last 72 hours. LFT's Lab Results  Component Value Date   ALT 24 10/19/2014   AST 29 10/19/2014   ALKPHOS 76 10/19/2014   BILITOT 0.4 10/19/2014    Studies/Results: No results found.  Medications:  I have reviewed the patient's current medications. Scheduled Medications: . donepezil  10 mg Oral QHS  . febuxostat  40 mg  Oral Daily  . feeding supplement (ENSURE ENLIVE)  237 mL Oral BID BM  . folic acid  1 mg Oral Daily  . furosemide  20 mg Oral Daily  . gabapentin  200 mg Oral BID  . hydrocerin   Topical BID  . iron polysaccharides  150 mg Oral Daily  . isosorbide mononitrate  15 mg Oral Daily  . metoprolol succinate  50 mg Oral Daily  . pantoprazole  40 mg Oral Daily  . simvastatin  10 mg Oral Daily  . tamsulosin  0.4 mg Oral QPC supper  . thiamine  100 mg Oral Daily  . warfarin  5 mg Oral Once per day on Sun Tue Wed Thu Sat  . warfarin  7.5 mg Oral Once per day on Mon Fri  . Warfarin - Pharmacist Dosing Inpatient   Does not apply q1800   PRN Medications: alum & mag hydroxide-simeth, bisacodyl, bisacodyl, guaiFENesin-dextromethorphan, guaiFENesin-dextromethorphan,  ondansetron **OR** ondansetron (ZOFRAN) IV, oxyCODONE, phenol, senna-docusate, traZODone  Assessment/Plan: Principal Problem:   Status post above knee amputation of left lower extremity Active Problems:   Benign essential HTN   Chronic diastolic heart failure, NYHA class 1   Atrial fibrillation with RVR   1. Functional deficits secondary to L-AKA due to severe PVD with gangrenous changes.  -may need to seek lower intensity rehab venue depending upon progress here---check with team-- 2. H/o PE/DVT/Anticoagulation: Pharmaceutical: Coumadin--level therapeutic 3. Pain Management: limit oxycodone due mental status 4. Mood: LCSW to follow for evaluation and support.  5. Neuropsych: This patient is not capable of making decisions on his own behalf. 6. Skin/Wound Care: Monitor wound daily for healing. Routine pressure relief measures.  -stump sock as tolerated- -prevalon boot right foot while in bed 7. Fluids/Electrolytes/Nutrition:encourage PO intake.  8. ABLA: iron supplement added. hgb trend reviewed---no outward signs of blood loss -follow up labs monday 9. Non-obstructive CAD: On BB and statin.BP/HR clinically within range 10. Dementia: Continue Aricept. Delirium improved---avoid excessive narcotics/neurosedating meds 11. CKD stage IV: Improved with hydration. Monitor for now.  12. Combined systolic and diastolic EMV:[id number], nitrates and statin.  -weight 63-66kg -resumed low dose lasix 13. BPH- On Flomax.  -ua neg, ucx reviewed: nonsp   Length of stay, days: 8  Valerie A. Asa Lente, MD 10/26/2014, 10:13 AM

## 2014-10-27 ENCOUNTER — Inpatient Hospital Stay (HOSPITAL_COMMUNITY): Payer: Medicare Other | Admitting: Physical Therapy

## 2014-10-27 ENCOUNTER — Inpatient Hospital Stay (HOSPITAL_COMMUNITY): Payer: Medicare Other | Admitting: Occupational Therapy

## 2014-10-27 LAB — PROTIME-INR
INR: 3.01 — ABNORMAL HIGH (ref 0.00–1.49)
Prothrombin Time: 30.7 seconds — ABNORMAL HIGH (ref 11.6–15.2)

## 2014-10-27 NOTE — Progress Notes (Signed)
Scott [name] is a 81 y.o. male Mar 15, 1922 [id number]  Subjective: No new complaints. No new problems. Slept well. Feeling OK.  Objective: Vital signs in last 24 hours: Temp:  [98 F (36.7 C)-99.2 F (37.3 C)] 98 F (36.7 C) (07/17 0555) Pulse Rate:  [56-72] 56 (07/17 0555) Resp:  [16-18] 18 (07/17 0555) BP: (108-155)/(43-48) 155/45 mmHg (07/17 0555) SpO2:  [100 %] 100 % (07/17 0555) Weight:  [64.5 kg (142 lb 3.2 oz)] 64.5 kg (142 lb 3.2 oz) (07/17 0555) Weight change: 0.2 kg (7.1 oz) Last BM Date: 10/26/14  Intake/Output from previous day: 07/16 0701 - 07/17 0700 In: 480 [P.O.:480] Out: 1175 [Urine:1175]  Physical Exam General: No apparent distress    Lungs: Normal effort. Lungs clear to auscultation, no crackles or wheezes. Cardiovascular: Regular rate and rhythm, no edema Neurological: No new neurological deficits   Lab Results: BMET    Component Value Date/Time   NA 140 10/25/2014 0840   NA 139 08/26/2014 1620   K 3.7 10/25/2014 0840   CL 107 10/25/2014 0840   CO2 28 10/25/2014 0840   GLUCOSE 114* 10/25/2014 0840   GLUCOSE 112* 08/26/2014 1620   BUN 18 10/25/2014 0840   BUN 45* 08/26/2014 1620   CREATININE 1.01 10/25/2014 0840   CREATININE 0.91 11/02/2012 1310   CALCIUM 9.3 10/25/2014 0840   GFRNONAA >60 10/25/2014 0840   GFRNONAA 74 11/02/2012 1310   GFRAA >60 10/25/2014 0840   GFRAA 86 11/02/2012 1310   CBC    Component Value Date/Time   WBC 6.1 10/25/2014 0522   WBC 4.6 07/30/2014 1402   WBC 4.7 03/26/2014 1446   WBC 3.5* 01/13/2011 1349   RBC 2.92* 10/25/2014 0522   RBC 4.20* 07/30/2014 1402   RBC 4.14 03/26/2014 1446   RBC 3.57* 01/13/2011 1349   HGB 7.8* 10/25/2014 0522   HGB 10.7* 07/30/2014 1402   HGB 11.5* 01/13/2011 1349   HCT 24.3* 10/25/2014 0522   HCT 36.0* 07/30/2014 1402   HCT 33.7* 01/13/2011 1349   PLT 310 10/25/2014 0522   PLT 207 01/13/2011 1349   MCV 83.2 10/25/2014 0522   MCV 85.7 07/30/2014 1402   MCV 94.3 01/13/2011  1349   MCH 26.7 10/25/2014 0522   MCH 25.5* 07/30/2014 1402   MCH 27.8 03/26/2014 1446   MCH 32.2 01/13/2011 1349   MCHC 32.1 10/25/2014 0522   MCHC 29.8* 07/30/2014 1402   MCHC 32.6 03/26/2014 1446   MCHC 34.1 01/13/2011 1349   RDW 14.9 10/25/2014 0522   RDW 15.6* 03/26/2014 1446   RDW 17.4* 01/13/2011 1349   LYMPHSABS 1.0 10/19/2014 0510   LYMPHSABS 1.3 03/26/2014 1446   LYMPHSABS 0.5* 01/13/2011 1349   MONOABS 0.7 10/19/2014 0510   MONOABS 0.4 01/13/2011 1349   EOSABS 0.2 10/19/2014 0510   EOSABS 0.1 03/26/2014 1446   BASOSABS 0.0 10/19/2014 0510   BASOSABS 0.0 03/26/2014 1446   BASOSABS 0.0 01/13/2011 1349   CBG's (last 3):  No results for input(s): GLUCAP in the last 72 hours. LFT's Lab Results  Component Value Date   ALT 24 10/19/2014   AST 29 10/19/2014   ALKPHOS 76 10/19/2014   BILITOT 0.4 10/19/2014    Studies/Results: No results found.  Medications:  I have reviewed the patient's current medications. Scheduled Medications: . donepezil  10 mg Oral QHS  . febuxostat  40 mg Oral Daily  . feeding supplement (ENSURE ENLIVE)  237 mL Oral BID BM  . folic acid  1 mg  Oral Daily  . furosemide  20 mg Oral Daily  . gabapentin  200 mg Oral BID  . hydrocerin   Topical BID  . iron polysaccharides  150 mg Oral Daily  . isosorbide mononitrate  15 mg Oral Daily  . metoprolol succinate  50 mg Oral Daily  . pantoprazole  40 mg Oral Daily  . simvastatin  10 mg Oral Daily  . tamsulosin  0.4 mg Oral QPC supper  . thiamine  100 mg Oral Daily  . warfarin  5 mg Oral Once per day on Sun Tue Wed Thu Sat  . warfarin  7.5 mg Oral Once per day on Mon Fri  . Warfarin - Pharmacist Dosing Inpatient   Does not apply q1800   PRN Medications: alum & mag hydroxide-simeth, bisacodyl, bisacodyl, guaiFENesin-dextromethorphan, guaiFENesin-dextromethorphan, ondansetron **OR** ondansetron (ZOFRAN) IV, oxyCODONE, phenol, senna-docusate, traZODone  Assessment/Plan: Principal Problem:    Status post above knee amputation of left lower extremity Active Problems:   Benign essential HTN   Chronic diastolic heart failure, NYHA class 1   Atrial fibrillation with RVR  1. Functional deficits secondary to L-AKA due to severe PVD with gangrenous changes.  -may need to seek lower intensity rehab venue depending upon progress here--- review dispo w/ team-- 2. H/o PE/DVT/Anticoagulation: Pharmaceutical: Coumadin--level therapeutic 3. Pain Management: limit oxycodone due mental status 4. Mood: LCSW to follow for evaluation and support.  5. Neuropsych: This patient is not capable of making decisions on his own behalf. 6. Skin/Wound Care: Monitor wound daily for healing. Routine pressure relief measures.  -stump sock as tolerated- -prevalon boot right foot while in bed 7. Fluids/Electrolytes/Nutrition:encourage PO intake.  8. ABLA: iron supplement added. hgb trend reviewed---no outward signs of blood loss -follow up labs monday 9. Non-obstructive CAD: On BB and statin.BP/HR clinically within range 10. Dementia: Continue Aricept. Delirium improved---avoid excessive narcotics/neurosedating meds 11. CKD stage IV: Improved with hydration. Monitor for now.  12. Combined systolic and diastolic HWK:[id number], nitrates and statin.  -weight 63-66kg -resumed low dose lasix 13. BPH- On Flomax.  -ua neg, ucx reviewed: nonsp  Length of stay, days: 9  Valerie A. Asa Lente, MD 10/27/2014, 9:12 AM

## 2014-10-27 NOTE — Progress Notes (Signed)
Physical Therapy Session Note  Patient Details  Name: Scott [name] MRN: [id number] Date of Birth: 08/22/1921  Today's Date: 10/27/2014 PT Individual Time: 1300-1400, 1500-1530 PT Individual Time Calculation (min): 60 min , 30 min  Short Term Goals: Week 1:  PT Short Term Goal 1 (Week 1): Pt will demonstrate supine to sit bed mobility req min A.  PT Short Term Goal 1 - Progress (Week 1): Met PT Short Term Goal 2 (Week 1): Pt will demonstrate squat-pivot transfers req min A.  PT Short Term Goal 2 - Progress (Week 1): Met PT Short Term Goal 3 (Week 1): Pt will propel manual w/c >= 150' req SBA.  PT Short Term Goal 3 - Progress (Week 1): Met PT Short Term Goal 4 (Week 1): Pt will ambulate 25' req min A.  PT Short Term Goal 4 - Progress (Week 1): Not met PT Short Term Goal 5 (Week 1): Pt will demonstrate rolling in bed with SBA/verbal cues.  PT Short Term Goal 5 - Progress (Week 1): Met Week 2:  PT Short Term Goal 1 (Week 2): = LTGs  Skilled Therapeutic Interventions/Progress Updates:   Pt with limited tolerance of modifed Thomas Test position indicating L hip flexor tightness.Pt with difficulty attaining upright posture limiting core strength.  Pt would continue to benefit from skilled PT services to increase functional mobility.  Therapy Documentation Precautions:  Precautions Precautions: Fall Precaution Comments: contact Restrictions Weight Bearing Restrictions: Yes LLE Weight Bearing: Non weight bearing Pain: Pain Assessment Pain Score: 4  Pain Location: Leg Mobility:  Min A with cues for sequencing, hand placement, and technique for transfers Locomotion : Ambulation Ambulation/Gait Assistance: 4: Min assist  Other Treatments:  Tx 1: Pt educated on rehab plan, safety in mobility, shrinker for residual limb, and rehab prognosis. Pt performs partial sit to stands 2x5. Transfers x12 in session. Pt performs gait trials 12' and 16' with cues for pacing, posture, and sequencing. Pt  performs bed mobility within functional context including LE manipulation, reaching, and grasping.   Tx 2: Pt educated on rehab plan, safety in mobility, postural control, and role of local mobility in hip. Pt performs partial sit to stands 2x5. Transfers x5 in session.Thomas test position x 8. Seated knee flexion, anterior pelvic tilts, hip flexion AROM 2x10. T/S myofascial release performed. See FIM for current functional status  Therapy/Group: Individual Therapy  [name] 10/27/2014, 1:49 PM

## 2014-10-27 NOTE — Progress Notes (Signed)
Occupational Therapy Session Note  Patient Details  Name: Scott [name] MRN: [id number] Date of Birth: [birth year]  Today's Date: 10/27/2014 OT Individual Time:  -   0900-1000  (60 min)       Short Term Goals: Week 1:  OT Short Term Goal 1 (Week 1): Pt. will performing groooming at SBA OT Short Term Goal 1 - Progress (Week 1): Met OT Short Term Goal 2 (Week 1): Pt will perform bathing with min assist OT Short Term Goal 2 - Progress (Week 1): Met OT Short Term Goal 3 (Week 1): Pt will perform UB dressing with min assist OT Short Term Goal 3 - Progress (Week 1): Met OT Short Term Goal 4 (Week 1): Pt will perform troilet transfer with mod assist OT Short Term Goal 4 - Progress (Week 1): Met OT Short Term Goal 5 (Week 1): Pt will perform shower transfer with mod assist OT Short Term Goal 5 - Progress (Week 1): Met Week 2:  OT Short Term Goal 1 (Week 2): LTG=STG  Skilled Therapeutic Interventions/Progress Updates:   Pt seen for 1:1 OT session with a focus on ADL retraining, functional transfers, sit<>stand, UE exercises, activity tolerance, and safety awareness. Pt supine in bed.    Agreeable to participate in ADL session. Pt completed bed mobility with  supervision and transfer from EOB>w/c with  min A. Pt engaged in bathing/dressing sit<>stand from w/c at sink level. Pt required min A for sit<>stand during ADL and required mod A for LB dressing for donning pants and buttoning.   Pt. Stood for 4 times for 1 minute each time.  Pt needed rest breaks in between each stand.  Pt unable to shave due to coumadin level being 3.0.   Pt. Propelled wc to bed.  Transferred from wc to bed with min assist and verbal cues for wc safety.  Postioned pt on right side for better skin integrity.  Left pt in bed with all needs in reach and bed alarm on.       Therapy Documentation Precautions:  Precautions Precautions: Fall Precaution Comments: contact Restrictions Weight Bearing Restrictions: Yes LLE  Weight Bearing: Non weight bearing      Pain:  6/10 back and left leg             See FIM for current functional status  Therapy/Group: Individual Therapy  Lisa Roca 10/27/2014, 7:53 AM

## 2014-10-27 NOTE — Progress Notes (Signed)
ANTICOAGULATION CONSULT NOTE - Follow Up Consult  Pharmacy Consult for coumadin Indication: hx of DVT/PE  Allergies  Allergen Reactions  . Lipitor [Atorvastatin] Other (See Comments)    unknown    Patient Measurements: Weight: 142 lb 3.2 oz (64.5 kg) Heparin Dosing Weight:   Vital Signs: Temp: 98 F (36.7 C) (07/17 0555) Temp Source: Oral (07/17 0555) BP: 155/45 mmHg (07/17 0555) Pulse Rate: 56 (07/17 0555)  Labs:  Recent Labs  10/25/14 0522 10/25/14 0840 10/27/14 0445  HGB 7.8*  --   --   HCT 24.3*  --   --   PLT 310  --   --   LABPROT 26.2*  --  30.7*  INR 2.43*  --  3.01*  CREATININE  --  1.01  --     Estimated Creatinine Clearance: 43.5 mL/min (by C-G formula based on Cr of 1.01).  Assessment: 81 yo male with hx of DVT/PE is continues on coumadin. INR is very slightly elevated 3.01. No bleeding noted.   Goal of Therapy:  INR 2-3   Plan:  - Cont Warfarin 5 mg daily except 7.5 mg qMon & Friday  - Change INR to daily to ensure INR does not continue to climb  Salome Arnt, PharmD, BCPS Pager # [phone] 10/27/2014 9:33 AM

## 2014-10-28 ENCOUNTER — Inpatient Hospital Stay (HOSPITAL_COMMUNITY): Payer: Medicare Other | Admitting: Occupational Therapy

## 2014-10-28 ENCOUNTER — Inpatient Hospital Stay (HOSPITAL_COMMUNITY): Payer: Medicare Other

## 2014-10-28 LAB — CBC
HCT: 26 % — ABNORMAL LOW (ref 39.0–52.0)
Hemoglobin: 8.1 g/dL — ABNORMAL LOW (ref 13.0–17.0)
MCH: 26.1 pg (ref 26.0–34.0)
MCHC: 31.2 g/dL (ref 30.0–36.0)
MCV: 83.9 fL (ref 78.0–100.0)
Platelets: 282 10*3/uL (ref 150–400)
RBC: 3.1 MIL/uL — ABNORMAL LOW (ref 4.22–5.81)
RDW: 15.7 % — ABNORMAL HIGH (ref 11.5–15.5)
WBC: 5.7 10*3/uL (ref 4.0–10.5)

## 2014-10-28 LAB — PROTIME-INR
INR: 2.95 — ABNORMAL HIGH (ref 0.00–1.49)
Prothrombin Time: 30.3 seconds — ABNORMAL HIGH (ref 11.6–15.2)

## 2014-10-28 NOTE — Progress Notes (Signed)
Complained of pain to left AKA, PRN oxy IR 2.5mg  given at 2049. Rested quietly until 0300. Confused after woke up at 0300 with attempts to get up without assistance. Incontinent of urine X 1. Patrici Ranks A

## 2014-10-28 NOTE — Progress Notes (Signed)
  [facility] Progress Note  Subjective  Having some pain with left AKA but is tolerating it.    Objective Filed Vitals:   10/28/14 0607  BP: 161/59  Pulse: 52  Temp: 98.2 F (36.8 C)  Resp: 18    Intake/Output Summary (Last 24 hours) at 10/28/14 0818 Last data filed at 10/28/14 0752  Gross per 24 hour  Intake    480 ml  Output   1000 ml  Net   -520 ml    Left AKA staple line clean and intact. Stump is soft without edema or erythema.   Assessment/Planning: 81 y.o. male is s/p: left AKA 10/16/14  Left AKA stump healing well.  F/u with Dr. Kellie Simmering on 11/19/14 for staple removal.   Virgina Jock, PA-C [facility] Office: [phone] Pager: [phone] 10/28/2014 8:18 AM

## 2014-10-28 NOTE — Progress Notes (Signed)
[place] PHYSICAL MEDICINE & REHABILITATION     PROGRESS NOTE    Subjective/Complaints: Slept well. Denies substantial stump pain. Struggles with therapy intensity  ROS: Pt denies fever, rash/itching, headache, blurred or double vision, nausea, vomiting, abdominal pain, diarrhea, chest pain, shortness of breath, palpitations, dysuria, dizziness, neck or back pain, bleeding, anxiety, or depression   Objective: Vital Signs: Blood pressure 161/59, pulse 52, temperature 98.2 F (36.8 C), temperature source Oral, resp. rate 18, weight 65.1 kg (143 lb 8.3 oz), SpO2 100 %. No results found.  Recent Labs  10/28/14 0525  WBC 5.7  HGB 8.1*  HCT 26.0*  PLT 282   No results for input(s): NA, K, CL, GLUCOSE, BUN, CREATININE, CALCIUM in the last 72 hours.  Invalid input(s): CO CBG (last 3)  No results for input(s): GLUCAP in the last 72 hours.  Wt Readings from Last 3 Encounters:  10/28/14 65.1 kg (143 lb 8.3 oz)  10/18/14 61.2 kg (134 lb 14.7 oz)  10/07/14 70.6 kg (155 lb 10.3 oz)    Physical Exam:  Constitutional: He is oriented to person, place, and time. He appears well-developed and well-nourished.  HENT: dentition poor. Mucosa fairly moist Head: Normocephalic and atraumatic.  Eyes: Conjunctivae are normal. Pupils are equal, round, and reactive to light.  Neck: Normal range of motion. Neck supple.  Cardiovascular: Normal rate and regular rhythm.no murmur Respiratory: Effort normal and breath sounds normal. No respiratory distress. He has no wheezes.  GI: Soft. Bowel sounds are normal. He exhibits no distension. There is no tenderness.  Musculoskeletal: He exhibits edema.  RLE with trace to  1+edema ----stable to improved Neurological: He is alert and oriented to person, place, and time. Improved insight and awareness. UE 4/5 prox to distal. RLE: 3/5 hf, ke and 4/5 adf/apf, LLE: 3/5 HF,  Skin: Skin is warm and dry. Incision c/d/i. No breakdown RLE---heel may be a  little soft  Assessment/Plan: 1. Functional deficits secondary to left AKA which require 15 hours over 7 days/week of interdisciplinary therapy in a comprehensive inpatient rehab setting. Intensity adjusted to better accommodate the patient's activity tolerance. Physiatrist is providing close team supervision and 24 hour management of active medical problems listed below. Physiatrist and rehab team continue to assess barriers to discharge/monitor patient progress toward functional and medical goals. FIM: FIM - Bathing Bathing Steps Patient Completed: Chest, Right Arm, Left Arm, Abdomen, Right upper leg, Left upper leg, Right lower leg (including foot), Front perineal area Bathing: 4: Min-Patient completes 8-9 6f 10 parts or 75+ percent  FIM - Upper Body Dressing/Undressing Upper body dressing/undressing steps patient completed: Thread/unthread right sleeve of pullover shirt/dresss, Thread/unthread left sleeve of pullover shirt/dress, Put head through opening of pull over shirt/dress, Pull shirt over trunk Upper body dressing/undressing: 5: Set-up assist to: Obtain clothing/put away FIM - Lower Body Dressing/Undressing Lower body dressing/undressing steps patient completed: Thread/unthread left pants leg, Thread/unthread right pants leg, Don/Doff right shoe Lower body dressing/undressing: 3: Mod-Patient completed 50-74% of tasks  FIM - Toileting Toileting steps completed by patient: Performs perineal hygiene Toileting Assistive Devices: Grab bar or rail for support Toileting: 2: Max-Patient completed 1 of 3 steps  FIM - Radio producer Devices: Engineer, civil (consulting), Insurance account manager Transfers: 2-To toilet/BSC: Max A (lift and lower assist), 2-From toilet/BSC: Max A (lift and lower assist)  FIM - Engineer, site Assistive Devices: Bed rails, Arm rests Bed/Chair Transfer: 5: Supine > Sit: Supervision (verbal cues/safety issues), 5: Sit > Supine:  Supervision (  verbal cues/safety issues), 4: Bed > Chair or W/C: Min A (steadying Pt. > 75%)  FIM - Locomotion: Wheelchair Distance: 100 Locomotion: Wheelchair: 5: Travels 150 ft or more: maneuvers on rugs and over door sills with supervision, cueing or coaxing FIM - Locomotion: Ambulation Locomotion: Ambulation Assistive Devices: Administrator Ambulation/Gait Assistance: 4: Min assist Locomotion: Ambulation: 1: Travels less than 50 ft with minimal assistance (Pt.>75%)  Comprehension Comprehension Mode: Auditory Comprehension: 4-Understands basic 75 - 89% of the time/requires cueing 10 - 24% of the time  Expression Expression Mode: Verbal Expression: 3-Expresses basic 50 - 74% of the time/requires cueing 25 - 50% of the time. Needs to repeat parts of sentences.  Social Interaction Social Interaction: 4-Interacts appropriately 75 - 89% of the time - Needs redirection for appropriate language or to initiate interaction.  Problem Solving Problem Solving: 2-Solves basic 25 - 49% of the time - needs direction more than half the time to initiate, plan or complete simple activities  Memory Memory: 2-Recognizes or recalls 25 - 49% of the time/requires cueing 51 - 75% of the time   Medical Problem List and Plan: 1. Functional deficits secondary to L-AKA due to severe PVD with gangrenous changes.   -placement? 2. H/o PE/DVT/Anticoagulation: Pharmaceutical: Coumadin---continue for now---level therapeutic 3. Pain Management: limit oxycodone due mental status 4. Mood: LCSW to follow for evaluation and support.  5. Neuropsych: This patient is not capable of making decisions on his own behalf. 6. Skin/Wound Care: wound healing--clean  -Routine pressure relief measures.   -stump sock   -prevalon boot right foot while in bed 7. Fluids/Electrolytes/Nutrition:encourage PO intake.  8. ABLA:  iron supplement added. hgb staying around 8.0 9. Non-obstructive CAD: On BB and statin.BP/HR  clinically within range 10. Dementia: Continue Aricept. Delirium improved---avoid excessive narcotics/neurosedating meds 11. CKD stage IV: Improved with hydration. Monitor for now.  12. Combined systolic and diastolic DGU:[id number], nitrates and statin.   -weight remains around 63-65kg  -resumed low dose lasix  -bmet on Friday was wnl 13. BPH- On Flomax.   -ua neg, ucx reviewed: only multispecies  LOS (Days) 10 A FACE TO FACE EVALUATION WAS PERFORMED  Angelea Penny T 10/28/2014 8:50 AM

## 2014-10-28 NOTE — Progress Notes (Signed)
Physical Therapy Session Note  Patient Details  Name: Scott [name] MRN: [id number] Date of Birth: Dec 03, 1921  Today's Date: 10/28/2014 PT Individual Time: 7846-9629 PT Individual Time Calculation (min): 30 min   Short Term Goals: Week 2:  PT Short Term Goal 1 (Week 2): = LTGs  Skilled Therapeutic Interventions/Progress Updates:   Session focused on functional bed mobility at S level for safety on flat surface, transfer training using RW for stand pivot and squat pivot techniques throughout session with assist needed for set-up and parts management of w/c and cues for technique, dynamic standing balance with RW for support with min A for clothing management, standing hip ROM flexion/extension to L x 10 reps each direction maintaining balance with S, and w/c propulsion with BUE for overall endurance/strengthening. Pt left in bed with call bell in reach and bed alarm on at end of session.   Therapy Documentation Precautions:  Precautions Precautions: Fall Precaution Comments: contact Restrictions Weight Bearing Restrictions: Yes LLE Weight Bearing: Non weight bearing  Pain:  Pt reports discomfort in residual limb but denies intervention at this time aside from repositioning.   See FIM for current functional status  Therapy/Group: Individual Therapy  Canary Brim Ivory Broad, PT, DPT  10/28/2014, 3:40 PM

## 2014-10-28 NOTE — Progress Notes (Signed)
ANTICOAGULATION CONSULT NOTE - Follow Up Consult  Pharmacy Consult for couamdin Indication: hx of DVT/PE  Allergies  Allergen Reactions  . Lipitor [Atorvastatin] Other (See Comments)    unknown    Patient Measurements: Weight: 143 lb 8.3 oz (65.1 kg) Heparin Dosing Weight:   Vital Signs: Temp: 98.2 F (36.8 C) (07/18 0607) Temp Source: Oral (07/18 0607) BP: 161/59 mmHg (07/18 0607) Pulse Rate: 52 (07/18 0607)  Labs:  Recent Labs  10/25/14 0840 10/27/14 0445 10/28/14 0525  HGB  --   --  8.1*  HCT  --   --  26.0*  PLT  --   --  282  LABPROT  --  30.7* 30.3*  INR  --  3.01* 2.95*  CREATININE 1.01  --   --     Estimated Creatinine Clearance: 43.9 mL/min (by C-G formula based on Cr of 1.01).   Medications:  Scheduled:  . donepezil  10 mg Oral QHS  . febuxostat  40 mg Oral Daily  . feeding supplement (ENSURE ENLIVE)  237 mL Oral BID BM  . folic acid  1 mg Oral Daily  . furosemide  20 mg Oral Daily  . gabapentin  200 mg Oral BID  . hydrocerin   Topical BID  . iron polysaccharides  150 mg Oral Daily  . isosorbide mononitrate  15 mg Oral Daily  . metoprolol succinate  50 mg Oral Daily  . pantoprazole  40 mg Oral Daily  . simvastatin  10 mg Oral Daily  . tamsulosin  0.4 mg Oral QPC supper  . thiamine  100 mg Oral Daily  . warfarin  5 mg Oral Once per day on Sun Tue Wed Thu Sat  . warfarin  7.5 mg Oral Once per day on Mon Fri  . Warfarin - Pharmacist Dosing Inpatient   Does not apply q1800   Infusions:    Assessment: 81 yo male with hx of DVT/PE is currently on therapeutic coumadin.  INR is down to 2.95 from 3.01.  Goal of Therapy:  INR 2-3 Monitor platelets by anticoagulation protocol: Yes   Plan:  Cont Warfarin 5 mg daily except 7.5 mg qMon & Friday (may need only 5 mg daily) Cont INR daily for now  Scott [name], Tsz-Yin 10/28/2014,8:18 AM

## 2014-10-28 NOTE — Progress Notes (Signed)
Occupational Therapy Session Note  Patient Details  Name: Scott [name] MRN: [id number] Date of Birth: [birth year]  Today's Date: 10/28/2014 OT Individual Time: 0930-1100 OT Individual Time Calculation (min): 90 min    Short Term Goals: Week 2:  OT Short Term Goal 1 (Week 2): LTG=STG  Skilled Therapeutic Interventions/Progress Updates:    PT seen for OT ADL bathing and dressing session and tx session with emphasis on functional activity tolerance and functional standing balance. Pt asleep in supine upon arrival, and with min encouragement, agreeable to participate in therapy. Pt voiced increased pain in R LE, RN made aware, however, pt not due for pain medication at this time  Pt transferred supine> EOB with supervision using hospital bed functions and completed stand pivot transfer using RW and min A to w/c. Pt declined showering task, and bathed seated in w/c at the sink. Pt declined bathing R foot and pericare/buttock hygiene. All other bathing completed seated with set-up. Pt dressed LB with steadying assist while he pulled pants up, and donned R shoe, bending over to floor to access foot. He completed oral care seated in w/c at sink mod I.   Pt then self propelled w/c to therapy gym, and transferred to mat with min A. Pt completed horse shoe reaching task, standing to place horse shoes on overhead basketball rim. Pt stood with supervision at [facility] and completed reaching task with CGA with one UE supported on RW. Completed x 2 trials, once on each side, tolerating ~1 minute before taking seated rest break. Pt then completed horse shoe toss with HHA and min-mod steadying assist, completing x2 trials progressing to overall min steadying assist.  Pt completed UE strengthening exercises using level I theraband, including shoulder expansion, rows,internal/external rotation and bicep curls. Completed 2 trials of 10 reps, requiring max verbal instruction each time as pt unable to recall exercises previously  completed. Pt returned to w/ and self propelled w/c back to room. He completed squat pivot transfer w/c> EOB with close supervision and verbal and tactile cues for technique. Pt doffed shoe seated on EOB with supervision and transferred into supine potion. Pt left in supine with all needs in reach and personal care giver present. RN entering as exiting.   Pt educated regarding POC, OT goals, pt progress, and d/c planning. Pt with increased alertness and active participation in therapy today, requiring min-supervision for standing and functional transfers, an increase from mod-max A.   Therapy Documentation Precautions:  Precautions Precautions: Fall Precaution Comments: contact Restrictions Weight Bearing Restrictions: Yes LLE Weight Bearing: Non weight bearing Pain: Pain Assessment Faces Pain Scale: Hurts little more Pain Location: Leg Pain Orientation: Right Pain Descriptors / Indicators: Aching Pain Intervention(s): RN made aware;Repositioned;Ambulation/increased activity  See FIM for current functional status  Therapy/Group: Individual Therapy  Lewis, Tondalaya Perren C 10/28/2014, 7:14 AM

## 2014-10-29 ENCOUNTER — Encounter: Payer: Medicare Other | Admitting: Vascular Surgery

## 2014-10-29 ENCOUNTER — Inpatient Hospital Stay (HOSPITAL_COMMUNITY): Payer: Medicare Other | Admitting: Occupational Therapy

## 2014-10-29 ENCOUNTER — Encounter (HOSPITAL_COMMUNITY): Payer: Medicare Other

## 2014-10-29 ENCOUNTER — Inpatient Hospital Stay (HOSPITAL_COMMUNITY): Payer: Medicare Other

## 2014-10-29 LAB — PROTIME-INR
INR: 3.16 — ABNORMAL HIGH (ref 0.00–1.49)
Prothrombin Time: 31.8 seconds — ABNORMAL HIGH (ref 11.6–15.2)

## 2014-10-29 MED ORDER — WARFARIN SODIUM 5 MG PO TABS
5.0000 mg | ORAL_TABLET | Freq: Every day | ORAL | Status: DC
  Administered 2014-10-29: 5 mg via ORAL
  Filled 2014-10-29: qty 1

## 2014-10-29 NOTE — Progress Notes (Signed)
[place] PHYSICAL MEDICINE & REHABILITATION     PROGRESS NOTE    Subjective/Complaints: Occasional stump pain.   ROS: Pt denies fever, rash/itching, headache, blurred or double vision, nausea, vomiting, abdominal pain, diarrhea, chest pain, shortness of breath, palpitations, dysuria, dizziness, neck or back pain, bleeding, anxiety, or depression   Objective: Vital Signs: Blood pressure 148/54, pulse 58, temperature 98.7 F (37.1 C), temperature source Oral, resp. rate 18, weight 64.8 kg (142 lb 13.7 oz), SpO2 97 %. No results found.  Recent Labs  10/28/14 0525  WBC 5.7  HGB 8.1*  HCT 26.0*  PLT 282   No results for input(s): NA, K, CL, GLUCOSE, BUN, CREATININE, CALCIUM in the last 72 hours.  Invalid input(s): CO CBG (last 3)  No results for input(s): GLUCAP in the last 72 hours.  Wt Readings from Last 3 Encounters:  10/29/14 64.8 kg (142 lb 13.7 oz)  10/18/14 61.2 kg (134 lb 14.7 oz)  10/07/14 70.6 kg (155 lb 10.3 oz)    Physical Exam:  Constitutional: He is oriented to person, place, and time. He appears well-developed and well-nourished.  HENT: dentition poor. Mucosa fairly moist Head: Normocephalic and atraumatic.  Eyes: Conjunctivae are normal. Pupils are equal, round, and reactive to light.  Neck: Normal range of motion. Neck supple.  Cardiovascular: Normal rate and regular rhythm.no murmur Respiratory: Effort normal and breath sounds normal. No respiratory distress. He has no wheezes.  GI: Soft. Bowel sounds are normal. He exhibits no distension. There is no tenderness.  Musculoskeletal: He exhibits edema.  RLE with trace to  1+edema  Neurological: He is alert and oriented to person, place, and time. Improved insight and awareness. UE 4/5 prox to distal. RLE: 3/5 hf, ke and 4/5 adf/apf, LLE: 3/5 HF,  Skin: Skin is warm and dry. Incision c/d/i. Wound well approximated Psych: pleasant and cooperative  Assessment/Plan: 1. Functional deficits  secondary to left AKA which require 15 hours over 7 days/week of interdisciplinary therapy in a comprehensive inpatient rehab setting. Intensity adjusted to better accommodate the patient's activity tolerance. Physiatrist is providing close team supervision and 24 hour management of active medical problems listed below. Physiatrist and rehab team continue to assess barriers to discharge/monitor patient progress toward functional and medical goals. FIM: FIM - Bathing Bathing Steps Patient Completed: Chest, Right Arm, Left Arm, Abdomen, Right upper leg, Left upper leg Bathing: 5: Supervision: Safety issues/verbal cues  FIM - Upper Body Dressing/Undressing Upper body dressing/undressing steps patient completed: Thread/unthread right sleeve of pullover shirt/dresss, Thread/unthread left sleeve of pullover shirt/dress, Put head through opening of pull over shirt/dress, Pull shirt over trunk Upper body dressing/undressing: 5: Set-up assist to: Obtain clothing/put away FIM - Lower Body Dressing/Undressing Lower body dressing/undressing steps patient completed: Thread/unthread left pants leg, Thread/unthread right pants leg, Fasten/unfasten right shoe Lower body dressing/undressing: 4: Min-Patient completed 75 plus % of tasks  FIM - Toileting Toileting steps completed by patient: Performs perineal hygiene Toileting Assistive Devices: Grab bar or rail for support Toileting: 2: Max-Patient completed 1 of 3 steps  FIM - Radio producer Devices: Engineer, civil (consulting), Insurance account manager Transfers: 2-To toilet/BSC: Max A (lift and lower assist), 2-From toilet/BSC: Max A (lift and lower assist)  FIM - Engineer, site Assistive Devices: Bed rails, Arm rests Bed/Chair Transfer: 5: Supine > Sit: Supervision (verbal cues/safety issues), 5: Sit > Supine: Supervision (verbal cues/safety issues), 4: Bed > Chair or W/C: Min A (steadying Pt. > 75%), 5: Chair or W/C > Bed:  Supervision (verbal cues/safety issues)  FIM - Locomotion: Wheelchair Distance: 100 Locomotion: Wheelchair: 5: Travels 150 ft or more: maneuvers on rugs and over door sills with supervision, cueing or coaxing FIM - Locomotion: Ambulation Locomotion: Ambulation Assistive Devices: Administrator Ambulation/Gait Assistance: 4: Min assist Locomotion: Ambulation: 0: Activity did not occur  Comprehension Comprehension Mode: Auditory Comprehension: 4-Understands basic 75 - 89% of the time/requires cueing 10 - 24% of the time  Expression Expression Mode: Verbal Expression: 3-Expresses basic 50 - 74% of the time/requires cueing 25 - 50% of the time. Needs to repeat parts of sentences.  Social Interaction Social Interaction: 4-Interacts appropriately 75 - 89% of the time - Needs redirection for appropriate language or to initiate interaction.  Problem Solving Problem Solving: 2-Solves basic 25 - 49% of the time - needs direction more than half the time to initiate, plan or complete simple activities  Memory Memory: 2-Recognizes or recalls 25 - 49% of the time/requires cueing 51 - 75% of the time   Medical Problem List and Plan: 1. Functional deficits secondary to L-AKA due to severe PVD with gangrenous changes.   -placement? 2. H/o PE/DVT/Anticoagulation: Pharmaceutical: Coumadin---continue for now---level supra- therapeutic--adjust per pharmacy 3. Pain Management: limit oxycodone due mental status 4. Mood: LCSW to follow for evaluation and support.  5. Neuropsych: This patient is not capable of making decisions on his own behalf. 6. Skin/Wound Care: wound healing--clean  -Routine pressure relief measures.   -stump sock for basic protection  -prevalon boot right foot while in bed 7. Fluids/Electrolytes/Nutrition:encourage PO intake.  8. ABLA:  iron supplement added. hgb staying around 8.0 9. Non-obstructive CAD: On BB and statin.BP/HR clinically within range 10. Dementia:  Continue Aricept. Delirium improved---avoid excessive narcotics/neurosedating meds 11. CKD stage IV: Improved with hydration. Monitor for now.  12. Combined systolic and diastolic GYF:VCBSWHQPRF, nitrates and statin.   -weight remains around 63-65kg  -resumed low dose lasix  -will recheck bmet thursday 13. BPH- On Flomax.   -ua neg, ucx reviewed: only multispecies  LOS (Days) 11 A FACE TO FACE EVALUATION WAS PERFORMED  Aryn Safran T 10/29/2014 7:43 AM

## 2014-10-29 NOTE — Progress Notes (Signed)
Occupational Therapy Session Note  Patient Details  Name: Scott [name] MRN: [id number] Date of Birth: 1922-01-20  Today's Date: 10/29/2014 OT Individual Time: 1100-1200 and 1430-1515 OT Individual Time Calculation (min): 60 min and 45 min   Short Term Goals: Week 2:  OT Short Term Goal 1 (Week 2): LTG=STG  Skilled Therapeutic Interventions/Progress Updates:    Session One: PT seen for ADL bathing and dressing session. Pt in supine upon arrival, attempting to remove ACE bandage which was placed on R LE from mid foot to mid thigh. Pt complained of ACE bandage being too tight and feeling as though "I'n not getting any circulation in my leg", bandage removed and pt educated regarding purpose of ACE wrapping. Pt transferred to EOB with supervision using hospital bed functions and with increased time transferred from EOB> w/c via stand pivot with RW with min A. Pt completed shortened bathing task seated in w/c at the sink, declining full showering task. Pt stood with steadying assist to complete buttock hygiene. Pt then completed toilet transfer, via squat pivot using grab bars and VCs for technique and min steadying assist. Pt completed LB strengthening exercises standing at end of bed at rail to complete L LE ab/adduction and forward/flexion x2 sets of 10 reps. He then completed 10 modified sit <> stand squats for LE strengthening and endurance and 10 w/c push -ups with verbal and demonstration cues provided for proper technique and form. Rest breaks provided throughout, however, demonstrated good functional activity tolerance, showing no s/s of fatigue during exercises.    Pt left sitting up in w/c at end of session, all needs in reach, and NT made aware.  Session Two: Pt seen for OT therapy session focusing on functional sitting balance, UE ROM/ strengthening, and functional activity tolerance. Pt in supine upon arrival, with NT present assessing vital signs, pt agreeable to tx, his son present for  session. Pt transferred supine> EOB with supervision and completed squat pivot transfer to w/c with supervision and cues for technique. Pt self propelled w/c to therapy gym with supervision for UE strengthening. He completed stand pivot transfer to therapy mat. Pt sat on EOM with supervision and completed "zoom ball" task with recreational therapist, required to AB/ADduct B UE during task. Pt then sat on foam mat with feet unsupported to complete task in order to increase functional sitting balance and core strength/ stability. Pt then completed ball toss activity against slanted trampoline with supervision. Occasional Rest breaks required throughout session, pt demonstrating fair activity tolerance. Pt then stood at [facility] with steadying assist to complete ball toss task with therapy tech. Pt unwilling to attempt to stand without B UE support, despite therapist providing steadying assist, opting instead to use one UE to tap ball back and fourth with one UE support on RW and min steadying assist. Pt returned to w/c at end of session and propelled chair back to room with supervision. Pt returned to recliner at end of session, left with all needs in reach and educated regarding use of call bell when desiring return to bed. Pt's son present for session and educated regarding pt's progress, purpose of activities during therapy session, and d/c planning.   Therapy Documentation Precautions:  Precautions Precautions: Fall Precaution Comments: contact Restrictions Weight Bearing Restrictions: Yes LLE Weight Bearing: Non weight bearing Pain: Pain Assessment Pain Assessment: No/denies pain Pain Score: 0-No pain  See FIM for current functional status  Therapy/Group: Individual Therapy  Lewis, Ramiya Delahunty C 10/29/2014, 7:12 AM

## 2014-10-29 NOTE — Progress Notes (Signed)
Physical Therapy Session Note  Patient Details  Name: Scott [name] MRN: [id number] Date of Birth: 22-Nov-1921  Today's Date: 10/29/2014 PT Individual Time: 1300-1400 PT Individual Time Calculation (min): 60 min   Short Term Goals: Week 2:  PT Short Term Goal 1 (Week 2): = LTGs  Skilled Therapeutic Interventions/Progress Updates:   Session focused on functional transfer training with squat pivot (S/steady A) and stand pivot with RW (min A) techniques with emphasis on carryover of hand placement and cues for technique, LE therex HEP to address strength and ROM to aid with overall mobility and transfers including hip flexion/extension (R & L), hip abduction (L), modified bridging, and standing hip flex/extension on L and mini squats in standing x 10 reps each, simulated car transfer squat pivot and stand pivot with RW with min A into car and mod A for out of car for both techniques, gait training with RW x 15' with min A and cues for upright posture and efficiency, and w/c mobility on unit >150' with S and extra time for overall UE strength and endurance. End of session returned to bed with close S and positioned in supine with all needs in reach. Pt making good functional progress though still continues to be limited by poor carryover, decreased activity tolerance, and decreased overall strength.  Therapy Documentation Precautions:  Precautions Precautions: Fall Precaution Comments: contact Restrictions Weight Bearing Restrictions: Yes LLE Weight Bearing: Non weight bearing  Pain:  Denies pain.  See FIM for current functional status  Therapy/Group: Individual Therapy  Canary Brim Ivory Broad, PT, DPT  10/29/2014, 2:09 PM

## 2014-10-29 NOTE — Progress Notes (Signed)
ANTICOAGULATION CONSULT NOTE - Follow Up Consult  Pharmacy Consult for coumadin Indication: hx of DVT/PE  Allergies  Allergen Reactions  . Lipitor [Atorvastatin] Other (See Comments)    unknown    Patient Measurements: Weight: 142 lb 13.7 oz (64.8 kg) Heparin Dosing Weight:   Vital Signs: Temp: 98.7 F (37.1 C) (07/19 0650) Temp Source: Oral (07/19 0650) BP: 148/54 mmHg (07/19 0650) Pulse Rate: 58 (07/19 0650)  Labs:  Recent Labs  10/27/14 0445 10/28/14 0525 10/29/14 0436  HGB  --  8.1*  --   HCT  --  26.0*  --   PLT  --  282  --   LABPROT 30.7* 30.3* 31.8*  INR 3.01* 2.95* 3.16*    Estimated Creatinine Clearance: 43.7 mL/min (by C-G formula based on Cr of 1.01).   Medications:  Scheduled:  . donepezil  10 mg Oral QHS  . febuxostat  40 mg Oral Daily  . feeding supplement (ENSURE ENLIVE)  237 mL Oral BID BM  . folic acid  1 mg Oral Daily  . furosemide  20 mg Oral Daily  . gabapentin  200 mg Oral BID  . hydrocerin   Topical BID  . iron polysaccharides  150 mg Oral Daily  . isosorbide mononitrate  15 mg Oral Daily  . metoprolol succinate  50 mg Oral Daily  . pantoprazole  40 mg Oral Daily  . simvastatin  10 mg Oral Daily  . tamsulosin  0.4 mg Oral QPC supper  . thiamine  100 mg Oral Daily  . warfarin  5 mg Oral Once per day on Sun Tue Wed Thu Sat  . warfarin  7.5 mg Oral Once per day on Mon Fri  . Warfarin - Pharmacist Dosing Inpatient   Does not apply q1800   Infusions:    Assessment: 81 yo male with hx of PE/DVT is currently on supratherapeutic coumadin.  INR today is 3.16.  Goal of Therapy:  INR 2-3 Monitor platelets by anticoagulation protocol: Yes   Plan:  Change coumadin to 5 mg po daily Cont INR daily for now Will continue to monitor for any signs/symptoms of bleeding    Torian Thoennes, Tsz-Yin 10/29/2014,8:20 AM

## 2014-10-30 ENCOUNTER — Inpatient Hospital Stay (HOSPITAL_COMMUNITY): Payer: Medicare Other | Admitting: Occupational Therapy

## 2014-10-30 ENCOUNTER — Inpatient Hospital Stay (HOSPITAL_COMMUNITY): Payer: Medicare Other

## 2014-10-30 LAB — PROTIME-INR
INR: 3.45 — ABNORMAL HIGH (ref 0.00–1.49)
Prothrombin Time: 34 seconds — ABNORMAL HIGH (ref 11.6–15.2)

## 2014-10-30 MED ORDER — WARFARIN SODIUM 2.5 MG PO TABS
2.5000 mg | ORAL_TABLET | Freq: Once | ORAL | Status: AC
  Administered 2014-10-30: 2.5 mg via ORAL
  Filled 2014-10-30: qty 1

## 2014-10-30 NOTE — Patient Care Conference (Signed)
Inpatient RehabilitationTeam Conference and Plan of Care Update Date: 10/29/2014   Time: 2:20 PM    Patient Name: KAYLUM SHRUM      Medical Record Number: [id number]  Date of Birth: 12/01/1921 Sex: Male         Room/Bed: 4W16C/4W16C-01 Payor Info: Payor: MEDICARE / Plan: MEDICARE PART A AND B / Product Type: *No Product type* /    Admitting Diagnosis: L AKA  Admit Date/Time:  10/18/2014  4:06 PM Admission Comments: No comment available   Primary Diagnosis:  Status post above knee amputation of left lower extremity Principal Problem: Status post above knee amputation of left lower extremity  Patient Active Problem List   Diagnosis Date Noted  . Status post above knee amputation of left lower extremity 10/18/2014  . Tachyarrhythmia 10/05/2014  . Aspiration pneumonia 10/05/2014  . UTI (lower urinary tract infection) 10/03/2014  . Sepsis due to other etiology 10/03/2014  . Gangrene of toe 10/03/2014  . Ischemic ulcer of left foot 10/03/2014  . Atherosclerotic PVD with ulceration 10/03/2014  . Warfarin-induced coagulopathy 10/03/2014  . Atrial fibrillation with RVR 10/03/2014  . Acute kidney injury 10/03/2014  . Hyperkalemia 10/03/2014  . Cellulitis of left leg 10/03/2014  . PAD (peripheral artery disease) 09/24/2014  . NSTEMI (non-ST elevated myocardial infarction) 10/09/2013  . High risk medication use 05/17/2013  . BPH (benign prostatic hyperplasia) 05/17/2013  . Vitamin D deficiency 05/17/2013  . History of pulmonary embolism (2014) 04/17/2013  . NSVT (nonsustained ventricular tachycardia)- not an ICD candidate 04/17/2013  . NICM (nonischemic cardiomyopathy)- EF 20-25% by Echo 04/12/13 04/17/2013  . Chronic diastolic heart failure, NYHA class 1 03/15/2013  . DVT of lower extremity, bilateral 03/15/2013  . Fatigue 11/02/2012  . Constipation 11/02/2012  . Alcohol abuse 08/27/2012  . Syncope 06/21/2012  . Bladder cancer 03/15/2012  . Benign localized hyperplasia of prostate with  urinary obstruction 03/15/2012  . Benign essential HTN   . Hyperlipidemia   . PVCs (premature ventricular contractions)   . CAD- non obstructive disease by cath 3/14     Expected Discharge Date: Expected Discharge Date:  (SNF)  Team Members Present: Physician leading conference: Dr. Alger Simons Social Worker Present: Lennart Pall, LCSW Nurse Present: Dorien Chihuahua, RN PT Present: Canary Brim, Dustin Folks, PT OT Present: Willeen Cass, OT;Other (comment) Napoleon Form, OT) SLP Present: Weston Anna, SLP PPS Coordinator present : Daiva Nakayama, RN, CRRN     Current Status/Progress Goal Weekly Team Focus  Medical   slow gradual progress. wounds healing.   improve activity tolerance and consistency  wound care, nutrition   Bowel/Bladder   rarely incont of bladder, calls appropiately. LBM 7/18  Minimal assistance for bowel and bladder  remain cont of bowel and bladder   Swallow/Nutrition/ Hydration             ADL's   When alert, supervision- min A functional transfers, toileting, and bathing; mod-max A when fatigued  Overall supervision- min A  Functional activity tolerance; functional staning balance   Mobility   min A w/c level; min A gait with close w/c follow ~ 15'; S bed mobility  S/min A w/c level; mod I w/c propulsion; min A gait with PT short distances  strengthening, endurance, transfers, standing balance, gait, HEP   Communication             Safety/Cognition/ Behavioral Observations            Pain   occasional pain complaints, Oxy IR 2.5mg  PRN  <  4 on a 0-10 scale  assess ashift and PRN   Skin   L AKA open to ari with staples intact  remain free from infection/breakdown while on rehab  assess q shift and PRN      *See Care Plan and progress notes for long and short-term goals.  Barriers to Discharge: age, motivation, lack of carryover    Possible Resolutions to Barriers:  seeking placement    Discharge Planning/Teaching Needs:  plan has changed to  SNF      Team Discussion:  SW reports d/c plan has changed to SNF.  Pt very "grumpy" but making progress overall.  Alertness affects how he performs.  Ambulating short distanced.  NO CARRY OVER session to session  Revisions to Treatment Plan:  Change in d/c plan to SNF   Continued Need for Acute Rehabilitation Level of Care: The patient requires daily medical management by a physician with specialized training in physical medicine and rehabilitation for the following conditions: Daily direction of a multidisciplinary physical rehabilitation program to ensure safe treatment while eliciting the highest outcome that is of practical value to the patient.: Yes Daily medical management of patient stability for increased activity during participation in an intensive rehabilitation regime.: Yes Daily analysis of laboratory values and/or radiology reports with any subsequent need for medication adjustment of medical intervention for : Neurological problems;Post surgical problems  Lenny Bouchillon 10/30/2014, 3:34 PM

## 2014-10-30 NOTE — Progress Notes (Signed)
Social Work Patient ID: [name], male   DOB: December 20, 1921, 81 y.o.   MRN: [id number]   Lowella Curb, LCSW Social Worker Signed  Patient Care Conference 10/30/2014  3:34 PM    Expand All Collapse All   Inpatient RehabilitationTeam Conference and Plan of Care Update Date: 10/29/2014   Time: 2:20 PM     Patient Name: Scott [name]       Medical Record Number: [id number]  Date of Birth: 09/02/1921 Sex: Male         Room/Bed: 4W16C/4W16C-01 Payor Info: Payor: MEDICARE / Plan: MEDICARE PART A AND B / Product Type: *No Product type* /    Admitting Diagnosis: L AKA   Admit Date/Time:  10/18/2014  4:06 PM Admission Comments: No comment available   Primary Diagnosis:  Status post above knee amputation of left lower extremity Principal Problem: Status post above knee amputation of left lower extremity    Patient Active Problem List     Diagnosis  Date Noted   .  Status post above knee amputation of left lower extremity  10/18/2014   .  Tachyarrhythmia  10/05/2014   .  Aspiration pneumonia  10/05/2014   .  UTI (lower urinary tract infection)  10/03/2014   .  Sepsis due to other etiology  10/03/2014   .  Gangrene of toe  10/03/2014   .  Ischemic ulcer of left foot  10/03/2014   .  Atherosclerotic PVD with ulceration  10/03/2014   .  Warfarin-induced coagulopathy  10/03/2014   .  Atrial fibrillation with RVR  10/03/2014   .  Acute kidney injury  10/03/2014   .  Hyperkalemia  10/03/2014   .  Cellulitis of left leg  10/03/2014   .  PAD (peripheral artery disease)  09/24/2014   .  NSTEMI (non-ST elevated myocardial infarction)  10/09/2013   .  High risk medication use  05/17/2013   .  BPH (benign prostatic hyperplasia)  05/17/2013   .  Vitamin D deficiency  05/17/2013   .  History of pulmonary embolism (2014)  04/17/2013   .  NSVT (nonsustained ventricular tachycardia)- not an ICD candidate  04/17/2013   .  NICM (nonischemic cardiomyopathy)- EF 20-25% by Echo 04/12/13  04/17/2013   .  Chronic  diastolic heart failure, NYHA class 1  03/15/2013   .  DVT of lower extremity, bilateral  03/15/2013   .  Fatigue  11/02/2012   .  Constipation  11/02/2012   .  Alcohol abuse  08/27/2012   .  Syncope  06/21/2012   .  Bladder cancer  03/15/2012   .  Benign localized hyperplasia of prostate with urinary obstruction  03/15/2012   .  Benign essential HTN     .  Hyperlipidemia     .  PVCs (premature ventricular contractions)     .  CAD- non obstructive disease by cath 3/14       Expected Discharge Date: Expected Discharge Date:  (SNF)  Team Members Present: Physician leading conference: Dr. Alger Simons Social Worker Present: Lennart Pall, LCSW Nurse Present: Dorien Chihuahua, RN PT Present: Canary Brim, Dustin Folks, PT OT Present: Willeen Cass, OT;Other (comment) Napoleon Form, OT) SLP Present: Weston Anna, SLP PPS Coordinator present : Daiva Nakayama, RN, CRRN        Current Status/Progress  Goal  Weekly Team Focus   Medical     slow gradual progress. wounds healing.    improve activity tolerance and  consistency  wound care, nutrition   Bowel/Bladder     rarely incont of bladder, calls appropiately. LBM 7/18  Minimal assistance for bowel and bladder   remain cont of bowel and bladder   Swallow/Nutrition/ Hydration               ADL's     When alert, supervision- min A functional transfers, toileting, and bathing; mod-max A when fatigued  Overall supervision- min A  Functional activity tolerance; functional staning balance    Mobility     min A w/c level; min A gait with close w/c follow ~ 15'; S bed mobility  S/min A w/c level; mod I w/c propulsion; min A gait with PT short distances  strengthening, endurance, transfers, standing balance, gait, HEP    Communication               Safety/Cognition/ Behavioral Observations              Pain     occasional pain complaints, Oxy IR 2.5mg  PRN  <4 on a 0-10 scale  assess ashift and PRN   Skin     L AKA open to ari with  staples intact   remain free from infection/breakdown while on rehab   assess q shift and PRN      *See Care Plan and progress notes for long and short-term goals.    Barriers to Discharge:  age, motivation, lack of carryover      Possible Resolutions to Barriers:   seeking placement     Discharge Planning/Teaching Needs:   plan has changed to SNF       Team Discussion:    SW reports d/c plan has changed to SNF.  Pt very "grumpy" but making progress overall.  Alertness affects how he performs.  Ambulating short distanced.  NO CARRY OVER session to session   Revisions to Treatment Plan:    Change in d/c plan to SNF    Continued Need for Acute Rehabilitation Level of Care: The patient requires daily medical management by a physician with specialized training in physical medicine and rehabilitation for the following conditions: Daily direction of a multidisciplinary physical rehabilitation program to ensure safe treatment while eliciting the highest outcome that is of practical value to the patient.: Yes Daily medical management of patient stability for increased activity during participation in an intensive rehabilitation regime.: Yes Daily analysis of laboratory values and/or radiology reports with any subsequent need for medication adjustment of medical intervention for : Neurological problems;Post surgical problems  Amos Micheals 10/30/2014, 3:34 PM                 Lowella Curb, Coulter Worker Signed  Patient Care Conference 10/22/2014  3:43 PM    Expand All Collapse All   Inpatient RehabilitationTeam Conference and Plan of Care Update Date: 10/22/2014   Time: 2:30 PM     Patient Name: Scott [name]       Medical Record Number: [id number]  Date of Birth: [birth year] Sex: Male         Room/Bed: 4W16C/4W16C-01 Payor Info: Payor: MEDICARE / Plan: MEDICARE PART A AND B / Product Type: *No Product type* /    Admitting Diagnosis: L AKA   Admit Date/Time:  10/18/2014  4:06  PM Admission Comments: No comment available   Primary Diagnosis:  Status post above knee amputation of left lower extremity Principal Problem: Status post above knee amputation of left lower extremity    Patient  Active Problem List     Diagnosis  Date Noted   .  Status post above knee amputation of left lower extremity  10/18/2014   .  Tachyarrhythmia  10/05/2014   .  Aspiration pneumonia  10/05/2014   .  UTI (lower urinary tract infection)  10/03/2014   .  Sepsis due to other etiology  10/03/2014   .  Gangrene of toe  10/03/2014   .  Ischemic ulcer of left foot  10/03/2014   .  Atherosclerotic PVD with ulceration  10/03/2014   .  Warfarin-induced coagulopathy  10/03/2014   .  Atrial fibrillation with RVR  10/03/2014   .  Acute kidney injury  10/03/2014   .  Hyperkalemia  10/03/2014   .  Cellulitis of left leg  10/03/2014   .  PAD (peripheral artery disease)  09/24/2014   .  NSTEMI (non-ST elevated myocardial infarction)  10/09/2013   .  High risk medication use  05/17/2013   .  BPH (benign prostatic hyperplasia)  05/17/2013   .  Vitamin D deficiency  05/17/2013   .  History of pulmonary embolism (2014)  04/17/2013   .  NSVT (nonsustained ventricular tachycardia)- not an ICD candidate  04/17/2013   .  NICM (nonischemic cardiomyopathy)- EF 20-25% by Echo 04/12/13  04/17/2013   .  Chronic diastolic heart failure, NYHA class 1  03/15/2013   .  DVT of lower extremity, bilateral  03/15/2013   .  Fatigue  11/02/2012   .  Constipation  11/02/2012   .  Alcohol abuse  08/27/2012   .  Syncope  06/21/2012   .  Bladder cancer  03/15/2012   .  Benign localized hyperplasia of prostate with urinary obstruction  03/15/2012   .  Benign essential HTN     .  Hyperlipidemia     .  PVCs (premature ventricular contractions)     .  CAD- non obstructive disease by cath 3/14       Expected Discharge Date: Expected Discharge Date: 11/01/14  Team Members Present: Physician leading conference: Dr.  Alger Simons Social Worker Present: Lennart Pall, LCSW Nurse Present: Heather Roberts, RN PT Present: Canary Brim, Cecille Rubin, PT OT Present: Salome Spotted, OT;Other (comment) (Amy Bobby Rumpf, OT) SLP Present: Weston Anna, SLP PPS Coordinator present : Daiva Nakayama, RN, CRRN        Current Status/Progress  Goal  Weekly Team Focus   Medical     left AKA, poor activity tolerance  wound care  cv balance, pain control,    Bowel/Bladder     continent of bowel and bladder: LBM 7/11  Minimal assistance for bowel and bladder   avoid use of condom cath to avoid skin breakdown    Swallow/Nutrition/ Hydration               ADL's     max-total A LB dressing and toileting; Set-up UB dressing and grooming  Overall supervision- min A  Toileting; LB dressing; standing balance; activity tolerance.    Mobility     mod A overall w/c level; +2 for pre-gait   S/min A w/c level; mod I w/c propulsion; mod A gait with PT short distances  endurance, strengthening, transfers, sit to stands, HEP, education   Communication               Safety/Cognition/ Behavioral Observations              Pain     2.5mg  Oxy  IR q 6hr for pain   <4 on a 0-10 scale  assess q 4hr and medicate as needed    Skin     L AKA with staples OTA, skin tear to R elbow   remain free from infection/breakdown while on rehab   assess skin q shift    Rehab Goals Patient on target to meet rehab goals: Yes *See Care Plan and progress notes for long and short-term goals.    Barriers to Discharge:  age, activity tolerance     Possible Resolutions to Barriers:   less intensity, 15/7 therapy     Discharge Planning/Teaching Needs:   home with private duty care vs SNF if too great for caregiver    ongoing    Team Discussion:    MD had lengthy discussion with son this am.  Monitor weights.  Pt variable in physical abilities requiring s/min assist - total assist at times.  Goals set for s/min but concern is whether he can be there  consistently.  Poor carryover and motor planning.  CAN BE continent only with planned toileting - pt has no awareness of b/b needs.   Revisions to Treatment Plan:    None    Continued Need for Acute Rehabilitation Level of Care: The patient requires daily medical management by a physician with specialized training in physical medicine and rehabilitation for the following conditions: Daily direction of a multidisciplinary physical rehabilitation program to ensure safe treatment while eliciting the highest outcome that is of practical value to the patient.: Yes Daily medical management of patient stability for increased activity during participation in an intensive rehabilitation regime.: Yes Daily analysis of laboratory values and/or radiology reports with any subsequent need for medication adjustment of medical intervention for : Post surgical problems;Pulmonary problems  Snigdha Howser 10/22/2014, 3:43 PM

## 2014-10-30 NOTE — Progress Notes (Signed)
Physical Therapy Session Note  Patient Details  Name: Scott [name] MRN: [id number] Date of Birth: March 11, 1922  Today's Date: 10/30/2014 PT Individual Time: 1400-1530 PT Individual Time Calculation (min): 90 min   Short Term Goals: Week 2:  PT Short Term Goal 1 (Week 2): = LTGs  Skilled Therapeutic Interventions/Progress Updates:   Pt easily frustrated during today's session and quick tempered. Redirected as able.  Session focused on functional transfer training with emphasis on safe technique (requires physical A with leg rest management and cues for set-up due to poor carryover), BLE therex (2# ankle weight on RLE and no weight on L) to address strengthening and ROM to aid with overall mobility, dynamic standing balance activity reaching for bean bags and tossing to various distances to address standing balance and reaching outside BOS at min A level, w/c mobility training for functional mobility and endurance training, Nustep for RLE and BUE strengthening and endurance on level 5 x 10 min with rest breaks as needed, simulated car transfer to prepare for d/c with min to light mod A needed but demonstrating improved ability to get out of car compared to yesterday though on second attempt had LOB with mod A to recover, gait training x 20' with min A and cues for posture and efficiency of foot clearance for safe mobility, and addressed functional endurance throughout session requiring rest breaks between activities. LE there ex included hip flexion and abduction BLE, LAQ and marching on RLE, and modified bridging x 15 reps x 2 sets each. Returned to room and assisted to bed with RLE elevated and bed alarm on. All needs in reach.   Therapy Documentation Precautions:  Precautions Precautions: Fall Precaution Comments: contact Restrictions Weight Bearing Restrictions: Yes LLE Weight Bearing: Non weight bearing  Pain:  Denies pain.  See FIM for current functional status  Therapy/Group:  Individual Therapy  Canary Brim Ivory Broad, PT, DPT  10/30/2014, 4:08 PM

## 2014-10-30 NOTE — Progress Notes (Signed)
Social Work Patient ID: Scott [name], male   DOB: 1921-09-28, 81 y.o.   MRN: [id number]   Met with pt and son yesterday to review team conference.  Pt continues to only make brief statements.  Flat, grumpy affect and difficult to engage.  Son aware and pleased with progress being made.  Pt and son aware that I am continuing to pursue SNF.    Reana Chacko, LCSW

## 2014-10-30 NOTE — Progress Notes (Signed)
Occupational Therapy Session Note  Patient Details  Name: Scott [name] MRN: [id number] Date of Birth: 04-07-1922  Today's Date: 10/30/2014 OT Individual Time: 5916-3846 OT Individual Time Calculation (min): 60 min    Short Term Goals: Week 2:  OT Short Term Goal 1 (Week 2): LTG=STG  Skilled Therapeutic Interventions/Progress Updates:    Pt seen for OT ADL bathing and dressing session. Pt in supine upon arrival, voicing fatigue, however agreeable to tx. Pt transferred supine> EOB with supervision using hospital bed functions and completed stand pivot transfer at [facility] into w/c. Pt taken into bathroom and completed stand pivot transfer into toilet with min A using grab bars. Pt bathed seated on shower chair with supervision and steadying assist when standing to complete buttock hygiene. Pt dressed in w/c at sink with assist for LB dressing. Following extended seated rest break, pt stood at sink to complete oral care task using one UE as stabilizer and min steadying assist provided. Pt transferred into recliner via stand pivot with RW and min A. Pt left in recliner at end of session, safety belt on, and all needs in reach.   Therapy Documentation Precautions:  Precautions Precautions: Fall Precaution Comments: contact Restrictions Weight Bearing Restrictions: Yes LLE Weight Bearing: Non weight bearing Pain:  No/ denies pain  See FIM for current functional status  Therapy/Group: Individual Therapy  Lewis, Tenicia Gural C 10/30/2014, 7:14 AM

## 2014-10-30 NOTE — Progress Notes (Signed)
ANTICOAGULATION CONSULT NOTE - Follow Up Consult  Pharmacy Consult for coumadin Indication: hx of DVT/PE  Allergies  Allergen Reactions  . Lipitor [Atorvastatin] Other (See Comments)    unknown    Patient Measurements: Weight: 139 lb 15.9 oz (63.5 kg) Heparin Dosing Weight:   Vital Signs: Temp: 98.5 F (36.9 C) (07/20 0613) Temp Source: Oral (07/20 8502) BP: 128/49 mmHg (07/20 0613) Pulse Rate: 55 (07/20 0613)  Labs:  Recent Labs  10/28/14 0525 10/29/14 0436 10/30/14 0529  HGB 8.1*  --   --   HCT 26.0*  --   --   PLT 282  --   --   LABPROT 30.3* 31.8* 34.0*  INR 2.95* 3.16* 3.45*    Estimated Creatinine Clearance: 42.8 mL/min (by C-G formula based on Cr of 1.01).   Medications:  Scheduled:  . donepezil  10 mg Oral QHS  . febuxostat  40 mg Oral Daily  . feeding supplement (ENSURE ENLIVE)  237 mL Oral BID BM  . folic acid  1 mg Oral Daily  . furosemide  20 mg Oral Daily  . gabapentin  200 mg Oral BID  . hydrocerin   Topical BID  . iron polysaccharides  150 mg Oral Daily  . isosorbide mononitrate  15 mg Oral Daily  . metoprolol succinate  50 mg Oral Daily  . pantoprazole  40 mg Oral Daily  . simvastatin  10 mg Oral Daily  . tamsulosin  0.4 mg Oral QPC supper  . thiamine  100 mg Oral Daily  . warfarin  5 mg Oral q1800  . Warfarin - Pharmacist Dosing Inpatient   Does not apply q1800   Infusions:    Assessment: 81 yo male with hx of DVT/PE is currently on supratherapeutic coumadin.  INR is up 3.45 from 3.16; patient was previously therapeutic on coumadin 5 mg daily except 7.5 mg MF  Goal of Therapy:  INR 2-3 Monitor platelets by anticoagulation protocol: Yes   Plan:  - d/c standing coumadin order; coumadin 2.5 mg po x1 - INR in am  Ilda Laskin, Tsz-Yin 10/30/2014,8:22 AM

## 2014-10-30 NOTE — Progress Notes (Signed)
[place] PHYSICAL MEDICINE & REHABILITATION     PROGRESS NOTE    Subjective/Complaints: Making gains in therapy. Engagement up and down.   ROS: Pt denies fever, rash/itching, headache, blurred or double vision, nausea, vomiting, abdominal pain, diarrhea, chest pain, shortness of breath, palpitations, dysuria, dizziness, neck or back pain, bleeding, anxiety, or depression   Objective: Vital Signs: Blood pressure 128/49, pulse 55, temperature 98.5 F (36.9 C), temperature source Oral, resp. rate 18, weight 63.5 kg (139 lb 15.9 oz), SpO2 100 %. No results found.  Recent Labs  10/28/14 0525  WBC 5.7  HGB 8.1*  HCT 26.0*  PLT 282   No results for input(s): NA, K, CL, GLUCOSE, BUN, CREATININE, CALCIUM in the last 72 hours.  Invalid input(s): CO CBG (last 3)  No results for input(s): GLUCAP in the last 72 hours.  Wt Readings from Last 3 Encounters:  10/30/14 63.5 kg (139 lb 15.9 oz)  10/18/14 61.2 kg (134 lb 14.7 oz)  10/07/14 70.6 kg (155 lb 10.3 oz)    Physical Exam:  Constitutional: He is oriented to person, place, and time. He appears well-developed and well-nourished.  HENT: dentition poor. Mucosa fairly moist Head: Normocephalic and atraumatic.  Eyes: Conjunctivae are normal. Pupils are equal, round, and reactive to light.  Neck: Normal range of motion. Neck supple.  Cardiovascular: Normal rate and regular rhythm.no murmur Respiratory: Effort normal and breath sounds normal. No respiratory distress. He has no wheezes.  GI: Soft. Bowel sounds are normal. He exhibits no distension. There is no tenderness.  Musculoskeletal: He exhibits edema.  RLE with trace to  1+edema  Neurological: He is alert and oriented to person, place, and time. Improved insight and awareness. UE 4/5 prox to distal. RLE: 3/5 hf, ke and 4/5 adf/apf, LLE: 3/5 HF,  Skin: Skin is warm and dry. Incision c/d/i. Wound well approximated Psych: pleasant and cooperative  Assessment/Plan: 1.  Functional deficits secondary to left AKA which require 15 hours over 7 days/week of interdisciplinary therapy in a comprehensive inpatient rehab setting. Intensity adjusted to better accommodate the patient's activity tolerance. Physiatrist is providing close team supervision and 24 hour management of active medical problems listed below. Physiatrist and rehab team continue to assess barriers to discharge/monitor patient progress toward functional and medical goals. FIM: FIM - Bathing Bathing Steps Patient Completed: Right upper leg, Left upper leg, Front perineal area, Buttocks Bathing: 4: Steadying assist  FIM - Upper Body Dressing/Undressing Upper body dressing/undressing steps patient completed: Thread/unthread right sleeve of pullover shirt/dresss, Thread/unthread left sleeve of pullover shirt/dress, Put head through opening of pull over shirt/dress, Pull shirt over trunk Upper body dressing/undressing: 5: Set-up assist to: Obtain clothing/put away FIM - Lower Body Dressing/Undressing Lower body dressing/undressing steps patient completed: Thread/unthread left pants leg, Thread/unthread right pants leg, Fasten/unfasten right shoe Lower body dressing/undressing: 4: Steadying Assist  FIM - Toileting Toileting steps completed by patient: Performs perineal hygiene Toileting Assistive Devices: Grab bar or rail for support Toileting: 2: Max-Patient completed 1 of 3 steps  FIM - Radio producer Devices: Grab bars Toilet Transfers: 4-To toilet/BSC: Min A (steadying Pt. > 75%), 4-From toilet/BSC: Min A (steadying Pt. > 75%)  FIM - Bed/Chair Transfer Bed/Chair Transfer Assistive Devices: Arm rests Bed/Chair Transfer: 5: Supine > Sit: Supervision (verbal cues/safety issues), 4: Bed > Chair or W/C: Min A (steadying Pt. > 75%), 5: Chair or W/C > Bed: Supervision (verbal cues/safety issues), 5: Sit > Supine: Supervision (verbal cues/safety issues)  FIM -  Locomotion:  Wheelchair Distance: 100 Locomotion: Wheelchair: 5: Travels 150 ft or more: maneuvers on rugs and over door sills with supervision, cueing or coaxing FIM - Locomotion: Ambulation Locomotion: Ambulation Assistive Devices: Administrator Ambulation/Gait Assistance: 4: Min assist Locomotion: Ambulation: 1: Travels less than 50 ft with minimal assistance (Pt.>75%)  Comprehension Comprehension Mode: Auditory Comprehension: 4-Understands basic 75 - 89% of the time/requires cueing 10 - 24% of the time  Expression Expression Mode: Verbal Expression: 3-Expresses basic 50 - 74% of the time/requires cueing 25 - 50% of the time. Needs to repeat parts of sentences.  Social Interaction Social Interaction: 4-Interacts appropriately 75 - 89% of the time - Needs redirection for appropriate language or to initiate interaction.  Problem Solving Problem Solving: 2-Solves basic 25 - 49% of the time - needs direction more than half the time to initiate, plan or complete simple activities  Memory Memory: 2-Recognizes or recalls 25 - 49% of the time/requires cueing 51 - 75% of the time   Medical Problem List and Plan: 1. Functional deficits secondary to L-AKA due to severe PVD with gangrenous changes.   -placement? 2. H/o PE/DVT/Anticoagulation: Pharmaceutical: Coumadin---continue for now---level supra- therapeutic--adjust per pharmacy 3. Pain Management: limit oxycodone due mental status 4. Mood: LCSW to follow for evaluation and support.  5. Neuropsych: This patient is not capable of making decisions on his own behalf. 6. Skin/Wound Care: wound healing--clean  -Routine pressure relief measures.   -stump sock for basic protection  -prevalon boot right foot while in bed  -pt doesn't want to wear ACE wrap 7. Fluids/Electrolytes/Nutrition:encourage PO intake.  8. ABLA:  iron supplement added. hgb staying around 8.0---recheck thursday 9. Non-obstructive CAD: On BB and statin.BP/HR clinically  within range 10. Dementia: Continue Aricept. Delirium improved---avoid neurosedating meds 11. CKD stage IV: Improved with hydration. Monitor for now.  12. Combined systolic and diastolic ZES:[id number], nitrates and statin.   -weight remains around 63-65kg  -resumed low dose lasix which he's doing well with  -will recheck bmet tomorrow 13. BPH- On Flomax.   -ua neg, ucx reviewed: only multispecies  LOS (Days) 12 A FACE TO FACE EVALUATION WAS PERFORMED  Shabnam Ladd T 10/30/2014 8:37 AM

## 2014-10-31 ENCOUNTER — Inpatient Hospital Stay (HOSPITAL_COMMUNITY): Payer: Medicare Other | Admitting: Occupational Therapy

## 2014-10-31 ENCOUNTER — Inpatient Hospital Stay (HOSPITAL_COMMUNITY): Payer: Medicare Other

## 2014-10-31 LAB — BASIC METABOLIC PANEL
Anion gap: 5 (ref 5–15)
BUN: 23 mg/dL — ABNORMAL HIGH (ref 6–20)
CO2: 29 mmol/L (ref 22–32)
Calcium: 9.1 mg/dL (ref 8.9–10.3)
Chloride: 104 mmol/L (ref 101–111)
Creatinine, Ser: 1.04 mg/dL (ref 0.61–1.24)
GFR calc Af Amer: >60 mL/min (ref >60)
GFR calc non Af Amer: >60 mL/min (ref >60)
Glucose, Bld: 88 mg/dL (ref 65–99)
Potassium: 4.2 mmol/L (ref 3.5–5.1)
Sodium: 138 mmol/L (ref 135–145)

## 2014-10-31 LAB — CBC
HCT: 24.2 % — ABNORMAL LOW (ref 39.0–52.0)
Hemoglobin: 7.7 g/dL — ABNORMAL LOW (ref 13.0–17.0)
MCH: 26.9 pg (ref 26.0–34.0)
MCHC: 31.8 g/dL (ref 30.0–36.0)
MCV: 84.6 fL (ref 78.0–100.0)
Platelets: 261 10*3/uL (ref 150–400)
RBC: 2.86 MIL/uL — ABNORMAL LOW (ref 4.22–5.81)
RDW: 16.4 % — ABNORMAL HIGH (ref 11.5–15.5)
WBC: 5.9 10*3/uL (ref 4.0–10.5)

## 2014-10-31 LAB — PROTIME-INR
INR: 3.44 — ABNORMAL HIGH (ref 0.00–1.49)
Prothrombin Time: 33.9 seconds — ABNORMAL HIGH (ref 11.6–15.2)

## 2014-10-31 MED ORDER — FUROSEMIDE 20 MG PO TABS
20.0000 mg | ORAL_TABLET | Freq: Every day | ORAL | Status: DC
  Administered 2014-11-03 – 2014-11-04 (×2): 20 mg via ORAL
  Filled 2014-10-31 (×3): qty 1

## 2014-10-31 NOTE — Progress Notes (Signed)
ANTICOAGULATION CONSULT NOTE - Follow Up Consult  Pharmacy Consult for coumadin Indication: hx of DVT/PE  Allergies  Allergen Reactions  . Lipitor [Atorvastatin] Other (See Comments)    unknown    Patient Measurements: Weight: 139 lb (63.05 kg) Heparin Dosing Weight:   Vital Signs: Temp: 98.4 F (36.9 C) (07/21 0544) Temp Source: Oral (07/21 0544) BP: 131/58 mmHg (07/21 0544) Pulse Rate: 73 (07/21 0544)  Labs:  Recent Labs  10/29/14 0436 10/30/14 0529 10/31/14 0420  HGB  --   --  7.7*  HCT  --   --  24.2*  PLT  --   --  261  LABPROT 31.8* 34.0* 33.9*  INR 3.16* 3.45* 3.44*  CREATININE  --   --  1.04    Estimated Creatinine Clearance: 41.3 mL/min (by C-G formula based on Cr of 1.04).   Medications:  Scheduled:  . donepezil  10 mg Oral QHS  . febuxostat  40 mg Oral Daily  . feeding supplement (ENSURE ENLIVE)  237 mL Oral BID BM  . folic acid  1 mg Oral Daily  . [START ON 11/03/2014] furosemide  20 mg Oral Daily  . gabapentin  200 mg Oral BID  . hydrocerin   Topical BID  . iron polysaccharides  150 mg Oral Daily  . isosorbide mononitrate  15 mg Oral Daily  . metoprolol succinate  50 mg Oral Daily  . pantoprazole  40 mg Oral Daily  . simvastatin  10 mg Oral Daily  . tamsulosin  0.4 mg Oral QPC supper  . thiamine  100 mg Oral Daily  . Warfarin - Pharmacist Dosing Inpatient   Does not apply q1800   Infusions:    Assessment: 81 yo male with hx of DVT/PE is currently on supratherapeutic coumadin.  INR remains above goal 3.44 after dose reduction yesterday. Patient was previously therapeutic on coumadin 5 mg daily except 7.5 mg MF. Hgb 7.7, plt wnl.   Goal of Therapy:  INR 2-3 Monitor platelets by anticoagulation protocol: Yes   Plan:  - Hold coumadin tonight - INR in am  Maryanna Shape, PharmD, BCPS  Clinical Pharmacist  Pager: [phone]   10/31/2014,1:38 PM

## 2014-10-31 NOTE — Progress Notes (Signed)
Physical Therapy Session Note  Patient Details  Name: Scott [name] MRN: [id number] Date of Birth: [birth year]  Today's Date: 10/31/2014 PT Individual Time: 3254-9826 PT Individual Time Calculation (min): 60 min   Short Term Goals: Week 2:  PT Short Term Goal 1 (Week 2): = LTGs  Skilled Therapeutic Interventions/Progress Updates:   Assisted pt with using urinal seated in w/c (results in DocFlow sheets) prior to leaving room for therapy. Pt propelled w/c to and from therapy at S level with extra time for functional UE strengthening and endurance. Transfers with S to min A needed for balance and set-up for w/c parts management. Dynamic sitting balance activity using 2# and then 3# straight weight to tap ball back and forth outside of limits of stability at S level. Seated RLE strengthening with 2# ankle weight for LAQ and marches x 15 reps x 2 sets. Standing L hip flexion and extension x 15 reps x 2 sets with S in standing to perform this to address overall strength and musculature endurance. Gait training with RW with min A x 15' with decreased R foot clearance and education provided on importance of this to decrease fall risk. Pt with limited understanding. Returned to bed end of session to rest and positioned in supine with RLE elevated on pillow to float heel. Pt's caregiver, Phineas Real, present during session and at pt's bedside. Bed alarm on.   Therapy Documentation Precautions:  Precautions Precautions: Fall Precaution Comments: contact Restrictions Weight Bearing Restrictions: Yes LLE Weight Bearing: Non weight bearing  Pain:  Denies pain.  See FIM for current functional status  Therapy/Group: Individual Therapy  Canary Brim Ivory Broad, PT, DPT  10/31/2014, 3:45 PM

## 2014-10-31 NOTE — Progress Notes (Signed)
[place] PHYSICAL MEDICINE & REHABILITATION     PROGRESS NOTE    Subjective/Complaints: No problems overnight per RN. Pt doesn'[name] engage much this morning.   ROS limited by mental status/behavior today  Objective: Vital Signs: Blood pressure 131/58, pulse 73, temperature 98.4 F (36.9 C), temperature source Oral, resp. rate 17, weight 63.05 kg (139 lb), SpO2 98 %. No results found.  Recent Labs  10/31/14 0420  WBC 5.9  HGB 7.7*  HCT 24.2*  PLT 261    Recent Labs  10/31/14 0420  NA 138  K 4.2  CL 104  GLUCOSE 88  BUN 23*  CREATININE 1.04  CALCIUM 9.1   CBG (last 3)  No results for input(s): GLUCAP in the last 72 hours.  Wt Readings from Last 3 Encounters:  10/31/14 63.05 kg (139 lb)  10/18/14 61.2 kg (134 lb 14.7 oz)  10/07/14 70.6 kg (155 lb 10.3 oz)    Physical Exam:  Constitutional: He is oriented to person, place, and time. He appears well-developed and well-nourished.  HENT: dentition poor. Mucosa fairly moist Head: Normocephalic and atraumatic.  Eyes: Conjunctivae are normal. Pupils are equal, round, and reactive to light.  Neck: Normal range of motion. Neck supple.  Cardiovascular: Normal rate and regular rhythm.no murmur Respiratory: Effort normal and breath sounds normal. No respiratory distress. He has no wheezes.  GI: Soft. Bowel sounds are normal. He exhibits no distension. There is no tenderness.  Musculoskeletal: He exhibits edema.  RLE with trace to  1+edema  Neurological: He is alert and oriented to person, place, and time. Improved insight and awareness. UE 4/5 prox to distal. RLE: 3/5 hf, ke and 4/5 adf/apf, LLE: 3/5 HF,  Skin: Skin is warm and dry. Incision c/d/i. Wound well approximated Psych: pleasant and cooperative  Assessment/Plan: 1. Functional deficits secondary to left AKA which require 15 hours over 7 days/week of interdisciplinary therapy in a comprehensive inpatient rehab setting. Intensity adjusted to better  accommodate the patient's activity tolerance. Physiatrist is providing close team supervision and 24 hour management of active medical problems listed below. Physiatrist and rehab team continue to assess barriers to discharge/monitor patient progress toward functional and medical goals. FIM: FIM - Bathing Bathing Steps Patient Completed: Right upper leg, Left upper leg, Front perineal area, Buttocks, Chest, Right Arm, Left Arm, Right lower leg (including foot), Left lower leg (including foot), Abdomen Bathing: 4: Steadying assist  FIM - Upper Body Dressing/Undressing Upper body dressing/undressing steps patient completed: Thread/unthread right sleeve of pullover shirt/dresss, Thread/unthread left sleeve of pullover shirt/dress, Put head through opening of pull over shirt/dress, Pull shirt over trunk Upper body dressing/undressing: 5: Set-up assist to: Obtain clothing/put away FIM - Lower Body Dressing/Undressing Lower body dressing/undressing steps patient completed: Thread/unthread left pants leg, Thread/unthread right pants leg, Don/Doff right sock Lower body dressing/undressing: 4: Min-Patient completed 75 plus % of tasks  FIM - Toileting Toileting steps completed by patient: Performs perineal hygiene Toileting Assistive Devices: Grab bar or rail for support Toileting: 2: Max-Patient completed 1 of 3 steps  FIM - Radio producer Devices: Grab bars Toilet Transfers: 4-To toilet/BSC: Min A (steadying Pt. > 75%), 4-From toilet/BSC: Min A (steadying Pt. > 75%)  FIM - Bed/Chair Transfer Bed/Chair Transfer Assistive Devices: Arm rests Bed/Chair Transfer: 5: Supine > Sit: Supervision (verbal cues/safety issues), 4: Bed > Chair or W/C: Min A (steadying Pt. > 75%), 5: Sit > Supine: Supervision (verbal cues/safety issues), 5: Chair or W/C > Bed: Supervision (verbal cues/safety issues)  FIM - Locomotion: Wheelchair Distance: 100 Locomotion: Wheelchair: 5: Travels 150  ft or more: maneuvers on rugs and over door sills with supervision, cueing or coaxing FIM - Locomotion: Ambulation Locomotion: Ambulation Assistive Devices: Administrator Ambulation/Gait Assistance: 4: Min assist Locomotion: Ambulation: 1: Travels less than 50 ft with minimal assistance (Pt.>75%)  Comprehension Comprehension Mode: Auditory Comprehension: 4-Understands basic 75 - 89% of the time/requires cueing 10 - 24% of the time  Expression Expression Mode: Verbal Expression: 3-Expresses basic 50 - 74% of the time/requires cueing 25 - 50% of the time. Needs to repeat parts of sentences.  Social Interaction Social Interaction: 4-Interacts appropriately 75 - 89% of the time - Needs redirection for appropriate language or to initiate interaction.  Problem Solving Problem Solving: 2-Solves basic 25 - 49% of the time - needs direction more than half the time to initiate, plan or complete simple activities  Memory Memory: 2-Recognizes or recalls 25 - 49% of the time/requires cueing 51 - 75% of the time   Medical Problem List and Plan: 1. Functional deficits secondary to L-AKA due to severe PVD with gangrenous changes.   -placement? 2. H/o PE/DVT/Anticoagulation: Pharmaceutical: Coumadin---continue for now---level still supra- therapeutic--adjust per pharmacy 3. Pain Management: limit oxycodone due mental status 4. Mood: LCSW to follow for evaluation and support.  5. Neuropsych: This patient is not capable of making decisions on his own behalf. 6. Skin/Wound Care: wound healing--clean  -Routine pressure relief measures.   -stump sock for basic protection  -prevalon boot right foot while in bed  -pt won'[name] consistently wear ACE wrap 7. Fluids/Electrolytes/Nutrition:encourage PO intake.  8. ABLA:  hgb around 7.7--not a great variance from baseline----continue fe+ supp  -needs follow up lab work on monday 9. Non-obstructive CAD: On BB and statin.BP/HR clinically within  range 10. Dementia: Continue Aricept. Delirium improved---avoid neurosedating meds 11. CKD stage IV: Improved with hydration. Monitor for now.  12. Combined systolic and diastolic UQJ:[id number], nitrates and statin.   -weight remains around 63-65kg  -resumed low dose lasix---hold for 2 days as BUN slightly up  -intake farily reasonable 13. BPH- On Flomax.   -ua neg, ucx reviewed: only multispecies  LOS (Days) 13 A FACE TO FACE EVALUATION WAS PERFORMED  Scott [name] 10/31/2014 8:52 AM

## 2014-10-31 NOTE — Progress Notes (Signed)
Occupational Therapy Session Note  Patient Details  Name: Scott [name] MRN: [id number] Date of Birth: 12-21-1921  Today's Date: 10/31/2014 OT Individual Time: 1300-1330 and 1100-1200 OT Individual Time Calculation (min): 30 min and 60 min   Short Term Goals: Week 2:  OT Short Term Goal 1 (Week 2): LTG=STG  Skilled Therapeutic Interventions/Progress Updates:    Session 1: Pt seen  for 1:1 OT session with a focus on ADL retraining, functional transfers, activity tolerance, UE strengthening, and safety awareness. Pt received seated in w/c with stated fatigue, but agreeable to participate in therapy. Pt completed stand-pivot from w/c>shower seat via use of grab bars with min A. Pt completed bathing sit<>stand from shower chair with varying close supervision and steadying A for hygiene in standing. Pt demonstrated good safety awareness during transfer shower seat>w/c. Pt completed dressing sit<>stand at sink level with steadying A during LB dressing. Pt utilized alternating UE as stabilizer for clothing management with steadying A. Pt completed UE strengthening exercises of bicep curl, chest press, tricep extension, to increase pt fluidity with sit<>stand and functional transfers. Pt left seated in w/c with friend present, QRB donned, and all other needs within reach.   Session 2: Pt seen for 1:1 OT session with a focus on functional transfers, dynamic standing balance, cognitive dual task, UE strengthening, and activity tolerance. Pt received seated in w/c with son present. Pt lethargic but agreeable to participate in session. Pt propelled w/c apprx 80' to increase UE strength. Pt completed transfer w/c>mat via RW with min A.  Pt engaged in cognitive dual task of pipe tree with first simple then progressing to moderately complex design with min cues. Pt completed in standing with steadying A to increase activity tolerance- able to stand for 6 minutes, 5 minutes, and 4 minutes with rest breaks in between.  Pt then transferred mat>w/c via RW with min A. Pt returned to room with total A due to time constraint. Pt left seated in w/c with lunch, QRB donned, and caregiver present.   Therapy Documentation Precautions:  Precautions Precautions: Fall Precaution Comments: contact Restrictions Weight Bearing Restrictions: Yes LLE Weight Bearing: Non weight bearing Pain:   No/denies pain   See FIM for current functional status  Therapy/Group: Individual Therapy  Dorann Ou 10/31/2014, 7:23 AM

## 2014-11-01 ENCOUNTER — Inpatient Hospital Stay (HOSPITAL_COMMUNITY): Payer: Medicare Other

## 2014-11-01 ENCOUNTER — Inpatient Hospital Stay (HOSPITAL_COMMUNITY): Payer: Medicare Other | Admitting: Occupational Therapy

## 2014-11-01 LAB — PROTIME-INR
INR: 3.01 — ABNORMAL HIGH (ref 0.00–1.49)
Prothrombin Time: 30.7 seconds — ABNORMAL HIGH (ref 11.6–15.2)

## 2014-11-01 MED ORDER — WARFARIN SODIUM 2.5 MG PO TABS
2.5000 mg | ORAL_TABLET | Freq: Once | ORAL | Status: AC
  Administered 2014-11-01: 2.5 mg via ORAL
  Filled 2014-11-01: qty 1

## 2014-11-01 NOTE — Discharge Summary (Signed)
Physician Discharge Summary  Patient ID: Scott [name] MRN: [id number] DOB/AGE: 07-20-1921 81 y.o.  Admit date: 10/18/2014 Discharge date: 11/04/2014  Discharge Diagnoses:  Principal Problem:   Status post above knee amputation of left lower extremity Active Problems:   Benign essential HTN   Chronic diastolic heart failure, NYHA class 1   Atrial fibrillation with RVR   Discharged Condition: Stable    Recent Labs  Basic Metabolic Panel: BMP Latest Ref Rng 10/31/2014 10/25/2014 10/19/2014  Glucose 65 - 99 mg/dL 88 114(H) 92  BUN 6 - 20 mg/dL 23(H) 18 20  Creatinine 0.61 - 1.24 mg/dL 1.04 1.01 1.00  BUN/Creat Ratio 10 - 22 - - -  Sodium 135 - 145 mmol/L 138 140 136  Potassium 3.5 - 5.1 mmol/L 4.2 3.7 4.1  Chloride 101 - 111 mmol/L 104 107 104  CO2 22 - 32 mmol/L 29 28 26   Calcium 8.9 - 10.3 mg/dL 9.1 9.3 8.8(L)    CBC: CBC Latest Ref Rng 11/04/2014 10/31/2014 10/28/2014  WBC 4.0 - 10.5 K/uL 4.4 5.9 5.7  Hemoglobin 13.0 - 17.0 g/dL 8.6(L) 7.7(L) 8.1(L)  Hematocrit 39.0 - 52.0 % 27.4(L) 24.2(L) 26.0(L)  Platelets 150 - 400 K/uL 208 261 282      Brief HPI:   Scott [name] is a 81 y.o. male with history of HTN, BLE DVT/ PE, A fib- chronic coumadin, NICM, severe PVD with gangrenous changes left foot with recommendations for amputation due to non-salvageable limb. Patient elected to undergo L-AKA on 10/16/14 by Dr. Kellie Simmering. Post op had agitation due to delirium as well as ABLA.IV antibiotics were d/c and coumadin was resumed. Delirium had resolved and he was able to participate in PT/OT evaluations. CIR was recommended for follow up therapy and patient was deemed to be a good rehab candidate.    Hospital Course: Scott [name] was admitted to rehab 10/18/2014 for inpatient therapies to consist of PT and OT at least three hours five days a week. Past admission physiatrist, therapy team and rehab RN have worked together to provide customized collaborative inpatient rehab. Blood pressures  were monitored in bid basis and have been relatively controlled. Follow up CBC showed ABLA which is slowly resolving. He continues on chronic coumadin and INR is therapeutic at 2.09 today. He is to continue on 5 mg daily with recheck INR on 07/27. Weights have been monitored daily and were noted to be on upward trend over the weekend.  Renal status has been monitored and lasix was resumed due to upward trend in weight. Prerenal azotemia was noted on check of lytes on 07/21 therefore lasix was held for 48 hours and  patient was encouraged to push po fluids. Lasix was resumed on 07/24 and weight is 145 lbs today. Recommend check of lytes on 07/27 to monitor renal status and continue to offer fluids frequently between meals.  LLE edema is managed with use of ace wrap for compression as well as elevation when in bed/chair.  Nutritional supplements were offered between meals to help with protein calorie malnutrition. L-AKA wound has been healing well without s/s of infection and is intact with staples in place. Pain has been well controlled with use of low dose oxycodone on prn basis.  He has had improvement in activity tolerance and has progressed to min to moderate assist. Family is unable to provide care needed and has elected on SNF for futher therapies. He was discharged to  [facility] on 11/04/14 in improved condition.  Rehab course: During patient's stay in rehab weekly team conferences were held to monitor patient's progress, set goals and discuss barriers to discharge. At admission, patient required total assist with mobility and max assist with basic self-care tasks. He has had improvement in activity tolerance, balance, postural control, as well as ability to compensate for deficits. Currently, he requires moderate assistance with bathing and is able to complete UB dressing with set up assist. He needs max assist with LB dressing and toileting tasks. He is able to complete transfers with min to mod  assist and is able to propel his wheelchair for 100' with supervision. He has been educated on pressure relief measures but is limited by poor carryover due to cognitive deficits.     Disposition: Skilled Nursing Facility  Diet: Heart Healthy. Low salt.   Special Instructions: 1. Check BMET and protime on 07/27. INR goal 2-3 range.  2. Ace wrap RLE from mid foot to mid thigh in am and remove wrap in evenings.  Elevate RLE when in bed/chair. 3. Pevalon boot on right foot when in bed.     Medication List    STOP taking these medications        docusate sodium 100 MG capsule  Commonly known as:  COLACE     megestrol 20 MG tablet  Commonly known as:  MEGACE     oxyCODONE-acetaminophen 5-325 MG per tablet  Commonly known as:  PERCOCET     potassium chloride SA 20 MEQ tablet  Commonly known as:  K-DUR,KLOR-CON      TAKE these medications        alum & mag hydroxide-simeth 200-200-20 MG/5ML suspension  Commonly known as:  MAALOX/MYLANTA  Take 30 mLs by mouth every 6 (six) hours as needed for indigestion or heartburn (dyspepsia).     bisacodyl 10 MG suppository  Commonly known as:  DULCOLAX  Place 1 suppository (10 mg total) rectally daily as needed for moderate constipation.     donepezil 10 MG tablet  Commonly known as:  ARICEPT  TAKE ONE TABLET AT BEDTIME     feeding supplement (ENSURE ENLIVE) Liqd  Take 237 mLs by mouth 2 (two) times daily between meals.     ferrous sulfate 325 (65 FE) MG tablet  Commonly known as:  FERROUSUL  Take 1 tablet (325 mg total) by mouth daily with breakfast.     fluticasone 50 MCG/ACT nasal spray  Commonly known as:  FLONASE  Place 2 sprays into both nostrils daily.     folic acid 1 MG tablet  Commonly known as:  FOLVITE  Take 1 tablet (1 mg total) by mouth daily.     furosemide 20 MG tablet  Commonly known as:  LASIX  Take 1 tablet (20 mg total) by mouth daily.     gabapentin 100 MG capsule  Commonly known as:  NEURONTIN  TAKE  (2) CAPSULES TWICE DAILY.     hydrocerin Crea  Apply 1 application topically 2 (two) times daily.     isosorbide mononitrate 30 MG 24 hr tablet  Commonly known as:  IMDUR  Take 0.5 tablets (15 mg total) by mouth daily.     metoprolol succinate 50 MG 24 hr tablet  Commonly known as:  TOPROL-XL  Take 1 tablet (50 mg total) by mouth daily. Take with or immediately following a meal.     nitroGLYCERIN 0.4 MG SL tablet  Commonly known as:  NITROSTAT  Place 1 tablet (0.4 mg total) under the tongue  every 5 (five) minutes as needed for chest pain (up to 3 doses).     oxyCODONE 5 MG immediate release tablet--Rx # 5 pills   Commonly known as:  Oxy IR/ROXICODONE  Take 0.5 tablets (2.5 mg total) by mouth every 6 (six) hours as needed for moderate pain.     pantoprazole 40 MG tablet  Commonly known as:  PROTONIX  Take 1 tablet (40 mg total) by mouth daily.     simvastatin 10 MG tablet  Commonly known as:  ZOCOR  TAKE 1 TABLET DAILY     tamsulosin 0.4 MG Caps capsule  Commonly known as:  FLOMAX  TAKE (1) CAPSULE DAILY     thiamine 100 MG tablet  Take 1 tablet (100 mg total) by mouth daily.     traZODone 50 MG tablet  Commonly known as:  DESYREL  Take 0.5-1 tablets (25-50 mg total) by mouth at bedtime as needed for sleep.     ULORIC 40 MG tablet  Generic drug:  febuxostat  TAKE 1 TABLET DAILY     warfarin 5 MG tablet  Commonly known as:  COUMADIN  Take 1 tablet (5 mg total) by mouth daily at 6 PM.       Follow-up Information    Follow up with Meredith Staggers, MD On 12/18/2014.   Specialty:  Physical Medicine and Rehabilitation   Why:  Be there at 10 am  for  10:20 am appointment   Contact information:   [address], [place] [address] [phone]       Follow up with Tinnie Gens, MD On 11/19/2014.   Specialties:  Vascular Surgery, Interventional Cardiology, Cardiology   Why:  Appointment at 12:45 for post op follow up.    Contact information:   [address] [place] [address] [phone]       Signed: Bary Leriche 11/04/2014, 10:37 AM

## 2014-11-01 NOTE — Progress Notes (Signed)
ANTICOAGULATION CONSULT NOTE - Follow Up Consult  Pharmacy Consult for coumadin Indication: hx of DVT/PE  Allergies  Allergen Reactions  . Lipitor [Atorvastatin] Other (See Comments)    unknown    Patient Measurements: Weight: 143 lb 1.3 oz (64.9 kg) Heparin Dosing Weight:   Vital Signs: Temp: 98.2 F (36.8 C) (07/22 0544) Temp Source: Oral (07/22 0544) BP: 111/64 mmHg (07/22 0544) Pulse Rate: 86 (07/22 0544)  Labs:  Recent Labs  10/30/14 0529 10/31/14 0420 11/01/14 0530  HGB  --  7.7*  --   HCT  --  24.2*  --   PLT  --  261  --   LABPROT 34.0* 33.9* 30.7*  INR 3.45* 3.44* 3.01*  CREATININE  --  1.04  --     Estimated Creatinine Clearance: 42.5 mL/min (by C-G formula based on Cr of 1.04).   Medications:  Scheduled:  . donepezil  10 mg Oral QHS  . febuxostat  40 mg Oral Daily  . feeding supplement (ENSURE ENLIVE)  237 mL Oral BID BM  . folic acid  1 mg Oral Daily  . [START ON 11/03/2014] furosemide  20 mg Oral Daily  . gabapentin  200 mg Oral BID  . hydrocerin   Topical BID  . iron polysaccharides  150 mg Oral Daily  . isosorbide mononitrate  15 mg Oral Daily  . metoprolol succinate  50 mg Oral Daily  . pantoprazole  40 mg Oral Daily  . simvastatin  10 mg Oral Daily  . tamsulosin  0.4 mg Oral QPC supper  . thiamine  100 mg Oral Daily  . Warfarin - Pharmacist Dosing Inpatient   Does not apply q1800   Infusions:    Assessment: 81 yo male with hx of DVT/PE is on coumadin. INR trending down to 2.01 after holding coumadin last night. Hgb 7.7, plt wnl.   Patient was previously therapeutic on coumadin 5 mg daily except 7.5 mg MF.   Goal of Therapy:  INR 2-3 Monitor platelets by anticoagulation protocol: Yes   Plan:  - Coumadin 2.5mg  po x 1 - INR in am  Maryanna Shape, PharmD, BCPS  Clinical Pharmacist  Pager: [phone]   11/01/2014,1:25 PM

## 2014-11-01 NOTE — Progress Notes (Signed)
Physical Therapy Session Note  Patient Details  Name: Scott [name] MRN: [id number] Date of Birth: September 30, 1921  Today's Date: 11/01/2014 PT Individual Time: 0900-1000 PT Individual Time Calculation (min): 60 min   Short Term Goals: Week 2:  PT Short Term Goal 1 (Week 2): = LTGs  Skilled Therapeutic Interventions/Progress Updates:   Pt presents in bed incontinent of urine and unaware. Assisted pt with hygiene and focused on functional bed mobility retraining for hygiene, donning brief, and getting clothing on with verbal cues and min A needed. Pt able to don shirt EOB with S for balance and set-up to obtain shirt. Pt performed supine therex  For functional strengthening of LLE including hip flexion, extension and abduction x 15 reps each prior to OOB awaiting instructions for management of wound of bottom (dressing vs. Cream) from RN. Extra time and increased cues needed to perform transfers this session including OOB and then on/off mat in gym due to difficulty with motor planning and required min physical assist. W/c propulsion for functional strengthening and endurance to and from therapy with S for cues for efficiency and encouragement. Dynamic sitting balance activity seated EOM without UE support to use rebounder and 1lb weighted medicine ball x 3 trials until fatigued to address balance and functional strength of UEs and core at S level. End of session left up in room with all needs in reach and safety belt donned. Pt limited during this session due to cognition.   Therapy Documentation Precautions:  Precautions Precautions: Fall Precaution Comments: contact Restrictions Weight Bearing Restrictions: Yes LLE Weight Bearing: Non weight bearing  Pain: Pain Assessment Pain Assessment: No/denies pain Pain Score: 0-No pain  See FIM for current functional status  Therapy/Group: Individual Therapy  Canary Brim Ivory Broad, PT, DPT  11/01/2014, 12:17 PM

## 2014-11-01 NOTE — Progress Notes (Signed)
[facility] PHYSICAL MEDICINE & REHABILITATION     PROGRESS NOTE    Subjective/Complaints: No new issues yesterday. Good night per RN.    ROS again limited by  behavior today  Objective: Vital Signs: Blood pressure 111/64, pulse 86, temperature 98.2 F (36.8 C), temperature source Oral, resp. rate 18, weight 64.9 kg (143 lb 1.3 oz), SpO2 100 %. No results found.  Recent Labs  10/31/14 0420  WBC 5.9  HGB 7.7*  HCT 24.2*  PLT 261    Recent Labs  10/31/14 0420  NA 138  K 4.2  CL 104  GLUCOSE 88  BUN 23*  CREATININE 1.04  CALCIUM 9.1   CBG (last 3)  No results for input(s): GLUCAP in the last 72 hours.  Wt Readings from Last 3 Encounters:  11/01/14 64.9 kg (143 lb 1.3 oz)  10/18/14 61.2 kg (134 lb 14.7 oz)  10/07/14 70.6 kg (155 lb 10.3 oz)    Physical Exam:  Constitutional: He is oriented to person, place, and time. He appears well-developed and well-nourished.  HENT: dentition poor. Mucosa fairly moist Head: Normocephalic and atraumatic.  Eyes: Conjunctivae are normal. Pupils are equal, round, and reactive to light.  Neck: Normal range of motion. Neck supple.  Cardiovascular: Normal rate and regular rhythm.no murmur Respiratory: Effort normal and breath sounds normal. No respiratory distress. He has no wheezes.  GI: Soft. Bowel sounds are normal. He exhibits no distension. There is no tenderness.  Musculoskeletal: He exhibits edema.  RLE with trace to  1+edema  Neurological: He is alert and oriented to person, place, and time. Improved insight and awareness. UE 4/5 prox to distal. RLE: 3/5 hf, ke and 4/5 adf/apf, LLE: 3/5 HF,  Skin: Skin is warm and dry. Incision c/d/i. Wound well approximated Psych: doesn't want to engage much  Assessment/Plan: 1. Functional deficits secondary to left AKA which require 15 hours over 7 days/week of interdisciplinary therapy in a comprehensive inpatient rehab setting. Intensity adjusted to better accommodate the  patient's activity tolerance. Physiatrist is providing close team supervision and 24 hour management of active medical problems listed below. Physiatrist and rehab team continue to assess barriers to discharge/monitor patient progress toward functional and medical goals. FIM: FIM - Bathing Bathing Steps Patient Completed: Right upper leg, Left upper leg, Front perineal area, Buttocks, Chest, Right Arm, Left Arm, Right lower leg (including foot), Left lower leg (including foot), Abdomen Bathing: 4: Steadying assist  FIM - Upper Body Dressing/Undressing Upper body dressing/undressing steps patient completed: Thread/unthread right sleeve of pullover shirt/dresss, Thread/unthread left sleeve of pullover shirt/dress, Put head through opening of pull over shirt/dress, Pull shirt over trunk Upper body dressing/undressing: 5: Set-up assist to: Obtain clothing/put away FIM - Lower Body Dressing/Undressing Lower body dressing/undressing steps patient completed: Thread/unthread left pants leg, Thread/unthread right pants leg, Don/Doff right sock Lower body dressing/undressing: 4: Min-Patient completed 75 plus % of tasks  FIM - Toileting Toileting steps completed by patient: Performs perineal hygiene Toileting Assistive Devices: Grab bar or rail for support Toileting: 2: Max-Patient completed 1 of 3 steps  FIM - Radio producer Devices: Grab bars Toilet Transfers: 4-To toilet/BSC: Min A (steadying Pt. > 75%), 4-From toilet/BSC: Min A (steadying Pt. > 75%)  FIM - Bed/Chair Transfer Bed/Chair Transfer Assistive Devices: Arm rests Bed/Chair Transfer: 5: Supine > Sit: Supervision (verbal cues/safety issues), 5: Sit > Supine: Supervision (verbal cues/safety issues), 4: Bed > Chair or W/C: Min A (steadying Pt. > 75%), 4: Chair or W/C >  Bed: Min A (steadying Pt. > 75%)  FIM - Locomotion: Wheelchair Distance: 100 Locomotion: Wheelchair: 5: Travels 150 ft or more: maneuvers on  rugs and over door sills with supervision, cueing or coaxing FIM - Locomotion: Ambulation Locomotion: Ambulation Assistive Devices: Administrator Ambulation/Gait Assistance: 4: Min assist Locomotion: Ambulation: 1: Travels less than 50 ft with minimal assistance (Pt.>75%)  Comprehension Comprehension Mode: Auditory Comprehension: 4-Understands basic 75 - 89% of the time/requires cueing 10 - 24% of the time  Expression Expression Mode: Verbal Expression: 3-Expresses basic 50 - 74% of the time/requires cueing 25 - 50% of the time. Needs to repeat parts of sentences.  Social Interaction Social Interaction: 4-Interacts appropriately 75 - 89% of the time - Needs redirection for appropriate language or to initiate interaction.  Problem Solving Problem Solving: 2-Solves basic 25 - 49% of the time - needs direction more than half the time to initiate, plan or complete simple activities  Memory Memory: 2-Recognizes or recalls 25 - 49% of the time/requires cueing 51 - 75% of the time   Medical Problem List and Plan: 1. Functional deficits secondary to L-AKA due to severe PVD with gangrenous changes.   -placement? 2. H/o PE/DVT/Anticoagulation: Pharmaceutical: Coumadin---continue for now---level still supra- therapeutic--adjust per pharmacy 3. Pain Management: limit oxycodone due mental status 4. Mood: LCSW to follow for evaluation and support.  5. Neuropsych: This patient is not capable of making decisions on his own behalf. 6. Skin/Wound Care: wound healing--clean  -Routine pressure relief measures.   -stump sock for basic protection  -prevalon boot right foot while in bed  -pt won't consistently wear ACE wrap 7. Fluids/Electrolytes/Nutrition:encourage PO intake.  8. ABLA:  hgb around 7.7--not a great variance from baseline----continue fe+ supp  -needs follow up lab work on monday 9. Non-obstructive CAD: On BB and statin.BP/HR clinically within range 10. Dementia: Continue  Aricept. Delirium improved---avoid neurosedating meds 11. CKD stage IV: Improved with hydration. Monitor for now.  12. Combined systolic and diastolic KNL:[id number], nitrates and statin.   -weight remains around 63-65kg  -resumed low dose lasix---holding until Sunday as BUN slightly up  -intake farily reasonable 13. BPH- On Flomax.   -ua neg, ucx reviewed: only multispecies  LOS (Days) 14 A FACE TO FACE EVALUATION WAS PERFORMED  SWARTZ,ZACHARY T 11/01/2014 8:43 AM

## 2014-11-01 NOTE — Progress Notes (Signed)
Occupational Therapy Session Note  Patient Details  Name: Scott [name] MRN: [id number] Date of Birth: 1922/02/19  Today's Date: 11/01/2014 OT Individual Time: -1500-1530   OT Individual Time Calculation (min): 30 min    Short Term Goals: Week 1:  OT Short Term Goal 1 (Week 1): Pt. will performing groooming at SBA OT Short Term Goal 1 - Progress (Week 1): Met OT Short Term Goal 2 (Week 1): Pt will perform bathing with min assist OT Short Term Goal 2 - Progress (Week 1): Met OT Short Term Goal 3 (Week 1): Pt will perform UB dressing with min assist OT Short Term Goal 3 - Progress (Week 1): Met OT Short Term Goal 4 (Week 1): Pt will perform troilet transfer with mod assist OT Short Term Goal 4 - Progress (Week 1): Met OT Short Term Goal 5 (Week 1): Pt will perform shower transfer with mod assist OT Short Term Goal 5 - Progress (Week 1): Met Week 2:  OT Short Term Goal 1 (Week 2): LTG=STG Week 3:     Skilled Therapeutic Interventions/Progress Updates:    Pt. Sitting in wc.  Propelled wc from room to [place].  Engaged in UE AROM and problem solving using small pipe tree.  Pt  Needed moderatel assist with problem solving and scanning for various parts.  Propelled wc back to room.  Sitter, Nikki to him to see his wife on the 6th floor.    Therapy Documentation Precautions:  Precautions Precautions: Fall Precaution Comments: contact Restrictions Weight Bearing Restrictions: Yes LLE Weight Bearing: Non weight bearing       Pain:  none             See FIM for current functional status  Therapy/Group: Individual Therapy  Lisa Roca 11/01/2014, 3:29 PM

## 2014-11-01 NOTE — Progress Notes (Signed)
Physical Therapy Session Note & Weekly Progress Note  Patient Details  Name: RACHID PARHAM MRN: [id number] Date of Birth: [birth year]  Beginning of progress report period: October 26, 2014 End of progress report period: November 01, 2014  Today's Date: 11/01/2014 PT Individual Time: 1405-1430 PT Individual Time Calculation (min): 25 min  Session focused on functional w/c mobility on carpeted surface navigating obstacles to simulate home environment mobility and increase strengthening in UE over more difficult surface at S level x 100', sit to stands and standing balance activity for balance training and pressure relief on bottom from being up in w/c, and education on self performing pressure relief (though pt unlikely to carryover this information due to cognitive deficits). Assisted pt with use of urinal before leaving room and all needs in reach awaiting next OT session.    Patient has met  6 of 9 long term goals.  Short term goals not set due to awaiting SNF placement. Pt is limited by cognitive (poor carryover of new information and requires set-up A for mobility and up to min physical A due to impaired motor planning). Family is unable to provide 24/7 A at this time and therefore, plan is for SNF. Schedule has been modified for activity tolerance. If SNF bed not found early next week, recommend for QD schedule to be initiated due to patient starting to reach a plateau due to cognition.   Patient continues to demonstrate the following deficits: decreased strength, decreased endurance, decreased balance, decreased functional mobility, gait impairments, impaired cognition, and therefore will continue to benefit from skilled PT intervention to enhance overall performance with activity tolerance, balance, ability to compensate for deficits and awareness.  See Patient's Care Plan for progression toward long term goals.  Patient progressing toward long term goals..  Continue plan of care.  Skilled  Therapeutic Interventions/Progress Updates:  Ambulation/gait training;Cognitive remediation/compensation;Discharge planning;DME/adaptive equipment instruction;Functional mobility training;Pain management;Psychosocial support;Splinting/orthotics;Therapeutic Activities;UE/LE Strength taining/ROM;Balance/vestibular training;Community reintegration;Disease management/prevention;Neuromuscular re-education;Patient/family education;Skin care/wound management;Therapeutic Exercise;UE/LE Coordination activities;Wheelchair propulsion/positioning   Therapy Documentation Precautions:  Precautions Precautions: Fall Precaution Comments: contact Restrictions Weight Bearing Restrictions: Yes LLE Weight Bearing: Non weight bearing   Pain:  Denies pain.  See FIM for current functional status  Therapy/Group: Individual Therapy  Canary Brim Ivory Broad, PT, DPT  11/01/2014, 3:46 PM

## 2014-11-01 NOTE — Progress Notes (Signed)
Occupational Therapy Weekly Progress Note  Patient Details  Name: Scott [name] MRN: [id number] Date of Birth: 19-Jul-1921  Beginning of progress report period: October 25, 2014 End of progress report period: November 01, 2014  Today's Date: 11/01/2014 OT Individual Time: 0100-1204 OT Individual Time Calculation (min): 54 min    Patient is progressing towards long term goals. Pt currently requires supervision- min A for functional transfers and min A for functional standing balance to complete bathing and toileting tasks.  Short term goals not set due to estimated length of stay. Anticipate pt to d/c to SNF.   Patient continues to demonstrate the following deficits: muscle weakness (generalized), difficulty with functional transfers, decreased functional endurance, decreased functional standing and sitting balance and pain in jointand and therefore will continue to benefit from skilled OT intervention to enhance overall performance with BADL.  See Patient's Care Plan for progression toward long term goals.  Patient progressing toward long term goals..  Continue plan of care.  Skilled Therapeutic Interventions/Progress Updates:    Pt seen for OT therapy session focusing on functional activity tolerance, UE strengthening, and functional standing balance. Pt in w/c upon arrival, agreeable to tx however declining bathing and dressing task. Pt self propelled w/c throughout unit and over various surfaces for UE strengthening and endurance, completed with supervision. In therapy gym, pt completed UE cycle bike for 5 minutes while sitting in w/c for UE strength and endurance. Pt then stood at RW to complete bean bag toss, required to toss color bean bags to respective color dot placed on floor. Completed x3 trials, tolerating 2.5 minutes, 2 minutes, and 3 minutes with seated rest break btwn sets. Steadying assist required in standing at RW and VCs for upright standing. Pt returned to w/c at end of session, stand  pivot transfers at [facility] completed with min A and VCs for technique.   Pt returned to room at end of session, left set-up in w/c in prep for lunch, all needs in reach.   Therapy Documentation Precautions:  Precautions Precautions: Fall Precaution Comments: contact Restrictions Weight Bearing Restrictions: Yes LLE Weight Bearing: Non weight bearing Pain: Pain Assessment Pain Assessment: No/denies pain Pain Score: 0-No pain  See FIM for current functional status  Therapy/Group: Individual Therapy  Lewis, Weston Kallman C 11/01/2014, 7:10 AM

## 2014-11-01 NOTE — Progress Notes (Signed)
Social Work Patient ID: Scott [name], male   DOB: 1922-03-19, 81 y.o.   MRN: [id number]   Have received two SNF bed offers for pt and he is speaking with his son to decide which facility bed he will accept.  Plan to move pt to facility on Monday, however, still awaiting choice on which one.  Will keep team posted.  Payslie Mccaig, LCSW

## 2014-11-02 ENCOUNTER — Inpatient Hospital Stay (HOSPITAL_COMMUNITY): Payer: Medicare Other

## 2014-11-02 LAB — PROTIME-INR
INR: 2.84 — ABNORMAL HIGH (ref 0.00–1.49)
Prothrombin Time: 29.4 seconds — ABNORMAL HIGH (ref 11.6–15.2)

## 2014-11-02 MED ORDER — WARFARIN SODIUM 2.5 MG PO TABS
2.5000 mg | ORAL_TABLET | Freq: Once | ORAL | Status: AC
  Administered 2014-11-02: 2.5 mg via ORAL
  Filled 2014-11-02: qty 1

## 2014-11-02 NOTE — Progress Notes (Signed)
[place] PHYSICAL MEDICINE & REHABILITATION     PROGRESS NOTE    Subjective/Complaints: Tired today, some stump pain but not severe   ROS again limited by  behavior today  Objective: Vital Signs: Blood pressure 126/62, pulse 75, temperature 98.3 F (36.8 C), temperature source Oral, resp. rate 18, weight 63.8 kg (140 lb 10.5 oz), SpO2 98 %. No results found.  Recent Labs  10/31/14 0420  WBC 5.9  HGB 7.7*  HCT 24.2*  PLT 261    Recent Labs  10/31/14 0420  NA 138  K 4.2  CL 104  GLUCOSE 88  BUN 23*  CREATININE 1.04  CALCIUM 9.1   CBG (last 3)  No results for input(s): GLUCAP in the last 72 hours.  Wt Readings from Last 3 Encounters:  11/02/14 63.8 kg (140 lb 10.5 oz)  10/18/14 61.2 kg (134 lb 14.7 oz)  10/07/14 70.6 kg (155 lb 10.3 oz)    Physical Exam:  Constitutional: He is oriented to person, place,. He appears well-developed and well-nourished.  HENT: dentition poor. Mucosa fairly moist Head: Normocephalic and atraumatic.  Eyes: Conjunctivae are normal. Pupils are equal, round, and reactive to light.  Neck: Normal range of motion. Neck supple.  Cardiovascular: Normal rate and regular rhythm.no murmur Respiratory: Effort normal and breath sounds normal. No respiratory distress. He has no wheezes.  GI: Soft. Bowel sounds are normal. He exhibits no distension. There is no tenderness.  Musculoskeletal: He exhibits edema.  RLE with trace to  1+edema  Neurological: He is alert and oriented to person, place, and time. Improved insight and awareness. UE 4/5 prox to distal. RLE: 3/5 hf, ke and 4/5 adf/apf, LLE: 3/5 HF,  Skin: Skin is warm and dry. Psych: doesn't want to engage much  Assessment/Plan: 1. Functional deficits secondary to left AKA which require 15 hours over 7 days/week of interdisciplinary therapy in a comprehensive inpatient rehab setting. Intensity adjusted to better accommodate the patient's activity tolerance. Physiatrist is  providing close team supervision and 24 hour management of active medical problems listed below. Physiatrist and rehab team continue to assess barriers to discharge/monitor patient progress toward functional and medical goals. FIM: FIM - Bathing Bathing Steps Patient Completed: Right upper leg, Left upper leg, Front perineal area, Buttocks, Chest, Right Arm, Left Arm, Right lower leg (including foot), Left lower leg (including foot), Abdomen Bathing: 4: Steadying assist  FIM - Upper Body Dressing/Undressing Upper body dressing/undressing steps patient completed: Thread/unthread right sleeve of pullover shirt/dresss, Thread/unthread left sleeve of pullover shirt/dress, Put head through opening of pull over shirt/dress, Pull shirt over trunk Upper body dressing/undressing: 5: Set-up assist to: Obtain clothing/put away FIM - Lower Body Dressing/Undressing Lower body dressing/undressing steps patient completed: Thread/unthread left pants leg, Thread/unthread right pants leg, Don/Doff right sock Lower body dressing/undressing: 1: Total-Patient completed less than 25% of tasks  FIM - Toileting Toileting steps completed by patient: Performs perineal hygiene Toileting Assistive Devices: Grab bar or rail for support Toileting: 2: Max-Patient completed 1 of 3 steps  FIM - Radio producer Devices: Grab bars Toilet Transfers: 4-To toilet/BSC: Min A (steadying Pt. > 75%), 4-From toilet/BSC: Min A (steadying Pt. > 75%)  FIM - Bed/Chair Transfer Bed/Chair Transfer Assistive Devices: Arm rests Bed/Chair Transfer: 5: Supine > Sit: Supervision (verbal cues/safety issues), 5: Sit > Supine: Supervision (verbal cues/safety issues), 4: Bed > Chair or W/C: Min A (steadying Pt. > 75%), 4: Chair or W/C > Bed: Min A (steadying Pt. > 75%)  FIM - Locomotion: Wheelchair Distance: 100 Locomotion: Wheelchair: 5: Travels 150 ft or more: maneuvers on rugs and over door sills with supervision,  cueing or coaxing FIM - Locomotion: Ambulation Locomotion: Ambulation Assistive Devices: Administrator Ambulation/Gait Assistance: 4: Min assist Locomotion: Ambulation: 1: Travels less than 50 ft with minimal assistance (Pt.>75%)  Comprehension Comprehension Mode: Auditory Comprehension: 4-Understands basic 75 - 89% of the time/requires cueing 10 - 24% of the time  Expression Expression Mode: Verbal Expression: 3-Expresses basic 50 - 74% of the time/requires cueing 25 - 50% of the time. Needs to repeat parts of sentences.  Social Interaction Social Interaction: 4-Interacts appropriately 75 - 89% of the time - Needs redirection for appropriate language or to initiate interaction.  Problem Solving Problem Solving: 2-Solves basic 25 - 49% of the time - needs direction more than half the time to initiate, plan or complete simple activities  Memory Memory: 2-Recognizes or recalls 25 - 49% of the time/requires cueing 51 - 75% of the time   Medical Problem List and Plan: 1. Functional deficits secondary to L-AKA due to severe PVD with gangrenous changes.   -placement? 2. H/o PE/DVT/Anticoagulation: Pharmaceutical: Coumadin---continue for now---level still supra- therapeutic--adjust per pharmacy 3. Pain Management: limit oxycodone due mental status, oriented to person, place not time 4. Mood: LCSW to follow for evaluation and support.  5. Neuropsych: This patient is not capable of making decisions on his own behalf. 6. Skin/Wound Care: wound healing--clean  -Routine pressure relief measures.   -stump sock for basic protection  -prevalon boot right foot while in bed  -pt won't consistently wear ACE wrap, doubt pt prosthetic candidate due to age  And comorbidities 7. Fluids/Electrolytes/Nutrition:encourage PO intake.  8. ABLA:  hgb around 7.7--not a great variance from baseline----continue fe+ supp  -needs follow up lab work on monday 9. Non-obstructive CAD: On BB and  statin.BP/HR clinically within range 10. Dementia: Continue Aricept. Delirium improved---avoid neurosedating meds 11. CKD stage IV: Improved with hydration. Monitor for now.  12. Combined systolic and diastolic AXE:[id number], nitrates and statin.   -weight remains around 63-64kg  -resumed low dose lasix---holding until tomorrow as BUN slightly up  -intake farily reasonable 13. BPH- On Flomax.   -ua neg, ucx reviewed: only multispecies  LOS (Days) 15 A FACE TO FACE EVALUATION WAS PERFORMED  Charlett Blake 11/02/2014 8:57 AM

## 2014-11-02 NOTE — Progress Notes (Signed)
ANTICOAGULATION CONSULT NOTE - Follow Up Consult  Pharmacy Consult for coumadin Indication: hx of DVT/PE  Allergies  Allergen Reactions  . Lipitor [Atorvastatin] Other (See Comments)    unknown    Patient Measurements: Weight: 140 lb 10.5 oz (63.8 kg) Heparin Dosing Weight:   Vital Signs: Temp: 98.3 F (36.8 C) (07/23 0447) Temp Source: Oral (07/23 0447) BP: 126/62 mmHg (07/23 0809) Pulse Rate: 75 (07/23 0447)  Labs:  Recent Labs  10/31/14 0420 11/01/14 0530 11/02/14 0501  HGB 7.7*  --   --   HCT 24.2*  --   --   PLT 261  --   --   LABPROT 33.9* 30.7* 29.4*  INR 3.44* 3.01* 2.84*  CREATININE 1.04  --   --     Estimated Creatinine Clearance: 41.7 mL/min (by C-G formula based on Cr of 1.04).   Medications:  Scheduled:  . donepezil  10 mg Oral QHS  . febuxostat  40 mg Oral Daily  . feeding supplement (ENSURE ENLIVE)  237 mL Oral BID BM  . folic acid  1 mg Oral Daily  . [START ON 11/03/2014] furosemide  20 mg Oral Daily  . gabapentin  200 mg Oral BID  . hydrocerin   Topical BID  . iron polysaccharides  150 mg Oral Daily  . isosorbide mononitrate  15 mg Oral Daily  . metoprolol succinate  50 mg Oral Daily  . pantoprazole  40 mg Oral Daily  . simvastatin  10 mg Oral Daily  . tamsulosin  0.4 mg Oral QPC supper  . thiamine  100 mg Oral Daily  . Warfarin - Pharmacist Dosing Inpatient   Does not apply q1800   Infusions:    Assessment: 81 yo male with hx of DVT/PE is on coumadin. INR trending down to 2.84 after holding coumadin on 7/21 and low dose yesterday. Hgb 7.7, plt wnl.   Patient was previously therapeutic on coumadin 5 mg daily except 7.5 mg MF.   Goal of Therapy:  INR 2-3 Monitor platelets by anticoagulation protocol: Yes   Plan:  - Repeat Coumadin 2.5mg  po x 1 - INR in am  Albertina Parr, PharmD., BCPS Clinical Pharmacist Pager [phone]

## 2014-11-02 NOTE — Progress Notes (Signed)
Occupational Therapy Session Note  Patient Details  Name: Scott [name] MRN: [id number] Date of Birth: 07-31-1921  Today's Date: 11/02/2014 OT Individual Time: 0900-1000 OT Individual Time Calculation (min): 60 min    Short Term Goals: Week 2:  OT Short Term Goal 1 (Week 2): LTG=STG  Skilled Therapeutic Interventions/Progress Updates: ADL-retraining at shower level with focus on endurance, dynamic standing balance, sit<>stand, transfers, and toileting.    Pt received supine in bed, agreeable to bathing and dressing session.   With setup to provide adjust bed and place w/c and steadying assist at [name], pt completed sit>stand with mod assist (pt=80%), and performed back slide step to reach w/c unassisted after pivot turn.   Pt was escorted to bathroom and performed SPT to tub bench with mod assist using grab bar and arm rests.   Pt bathed 6 of 10 body parts while seated and performed static standing as therapist washed his buttocks.   Pt returned to w/c and requested assist with toilet transfer but was unproductive to void BM (gas only).    Pt returned to w/c and dressed seated, standing when prompted to allow therapist assist to pull up underwear, pants, and LE sling support.    Pt left in w/c at end of session with all needs within reach.     Therapy Documentation Precautions:  Precautions Precautions: Fall Precaution Comments: contact Restrictions Weight Bearing Restrictions: Yes LLE Weight Bearing: Non weight bearing  Pain: Pain Assessment Pain Assessment: No/denies pain  See FIM for current functional status  Therapy/Group: Individual Therapy   Second session: Time: 3300-7622 Time Calculation (min):  60 min  Pain Assessment: No/denies pain  Skilled Therapeutic Interventions: ADL-retraining with focus on transfers, family ed (son present), toileting, and seated grooming.    Pt received supine in bed with son present and requesting report on pt's performance.   OT demonstrated  pt's ability to complete stand-pivot transfer from bed to w/c with mod assist (pt=80%) to lift and lower from RW.   As son left room, pt requested need for assist to toilet but was unable to hold his urine during transfer d/t clothing and additional support at stump.   Pt then voided large BM and completed toilet hygiene with min assist for thoroughness after cleansing himself.   Pt transferred back to w/c from toilet with mod assist and completed grooming (shaving) at sink with set-up and min assist for thoroughness.   Son requested recommendations on bowel & bladder management (routine) and was advised to repeat concern to PA-C or RN at any time.  See FIM for current functional status  Therapy/Group: Individual Therapy  [place] 11/02/2014, 12:09 PM

## 2014-11-03 ENCOUNTER — Encounter (HOSPITAL_COMMUNITY): Payer: Medicare Other

## 2014-11-03 ENCOUNTER — Inpatient Hospital Stay (HOSPITAL_COMMUNITY): Payer: Medicare Other

## 2014-11-03 LAB — PROTIME-INR
INR: 2.29 — ABNORMAL HIGH (ref 0.00–1.49)
Prothrombin Time: 24.9 seconds — ABNORMAL HIGH (ref 11.6–15.2)

## 2014-11-03 MED ORDER — WARFARIN SODIUM 5 MG PO TABS
5.0000 mg | ORAL_TABLET | Freq: Once | ORAL | Status: AC
  Administered 2014-11-03: 5 mg via ORAL
  Filled 2014-11-03: qty 1

## 2014-11-03 NOTE — Progress Notes (Signed)
ANTICOAGULATION CONSULT NOTE - Follow Up Consult  Pharmacy Consult for coumadin Indication: hx of DVT/PE  Allergies  Allergen Reactions  . Lipitor [Atorvastatin] Other (See Comments)    unknown    Patient Measurements: Weight: 153 lb 7 oz (69.6 kg)  Vital Signs: Temp: 98.4 F (36.9 C) (07/24 0606) Temp Source: Oral (07/24 0606) BP: 142/62 mmHg (07/24 0728) Pulse Rate: 66 (07/24 0728)  Labs:  Recent Labs  11/01/14 0530 11/02/14 0501 11/03/14 0510  LABPROT 30.7* 29.4* 24.9*  INR 3.01* 2.84* 2.29*    Estimated Creatinine Clearance: 45.5 mL/min (by C-G formula based on Cr of 1.04).   Medications:  Scheduled:  . donepezil  10 mg Oral QHS  . febuxostat  40 mg Oral Daily  . feeding supplement (ENSURE ENLIVE)  237 mL Oral BID BM  . folic acid  1 mg Oral Daily  . furosemide  20 mg Oral Daily  . gabapentin  200 mg Oral BID  . hydrocerin   Topical BID  . iron polysaccharides  150 mg Oral Daily  . isosorbide mononitrate  15 mg Oral Daily  . metoprolol succinate  50 mg Oral Daily  . pantoprazole  40 mg Oral Daily  . simvastatin  10 mg Oral Daily  . tamsulosin  0.4 mg Oral QPC supper  . thiamine  100 mg Oral Daily  . Warfarin - Pharmacist Dosing Inpatient   Does not apply q1800   Infusions:    Assessment: 81 yo male with hx of DVT/PE is on coumadin. INR remains therapeutic but has trended down to 2.29 after holding coumadin on 7/21 and low dose for two days. Hgb 7.7, plt wnl.   Patient was previously therapeutic on coumadin 5 mg daily except 7.5 mg MF.   Goal of Therapy:  INR 2-3 Monitor platelets by anticoagulation protocol: Yes   Plan:  - Coumadin 5 mg po x 1 - INR in am  Albertina Parr, PharmD., BCPS Clinical Pharmacist Pager [phone]

## 2014-11-03 NOTE — Progress Notes (Signed)
Occupational Therapy Session Note  Patient Details  Name: Scott [name] MRN: [id number] Date of Birth: 27-Jan-1922  Today's Date: 11/03/2014 OT Individual Time: 0800-0900 OT Individual Time Calculation (min): 60 min    Short Term Goals: Week 2:  OT Short Term Goal 1 (Week 2): LTG=STG  Skilled Therapeutic Interventions/Progress Updates:    Pt resting in bed upon arrival and agreeable to therapy.  Pt declined shower this morning and engaged in BADL training including bathing and dressing with sit<>stand from w/c at sink.  Pt required mod A for squat pivot transfer to w/c and mod A for sit<>stand at sink.  Pt required steady A for standing balance while pt used BUE support on sink. Pt required more than a reasonable amount of time to complete BADLs.  Pt initiated all tasks when presented with supplies and clothing.  Focus on activity tolerance, sit<>stand, standing balance, functional transfers, task initiation, and safety awareness to increased independence with BADLs.  Therapy Documentation Precautions:  Precautions Precautions: Fall Precaution Comments: contact Restrictions Weight Bearing Restrictions: Yes LLE Weight Bearing: Non weight bearing   Pain: Pain Assessment Pain Assessment: 0-10 Pain Score: 3  Pain Type: Phantom pain Pain Location: Leg Pain Orientation: Left Pain Descriptors / Indicators: Aching Pain Frequency: Intermittent Pain Intervention(s):RN aware, repositioned See FIM for current functional status  Therapy/Group: Individual Therapy  Leroy Libman 11/03/2014, 9:01 AM

## 2014-11-03 NOTE — Progress Notes (Signed)
Occupational Therapy Note  Patient Details  Name: [name] MRN: [id number] Date of Birth: 14-Apr-1921  Today's Date: 11/03/2014 OT Individual Time: 1500-1530 OT Individual Time Calculation (min): 30 min   Pt c/o 2/10 phantom pain in left leg; RN aware Individual Therapy  Pt propelled to therapy gym and engaged in BUE therex to increase BUE strength and independence with functional transfers.  Pt performed BUE therex with 2# weight bar and on [employer] (5 min random at work load 3 and 7 min random in opposite direction at work load 3). Pt propelled back to room and remained in w/c with all needs within reach.   Leotis Shames [facility] 11/03/2014, 3:31 PM

## 2014-11-03 NOTE — Progress Notes (Signed)
Physical Therapy Session Note  Patient Details  Name: Scott [name] MRN: [id number] Date of Birth: 01/20/1922  Today's Date: 11/03/2014 PT Individual Time: 1335-1430 PT Individual Time Calculation (min): 55 min   Short Term Goals: Week 2:  PT Short Term Goal 1 (Week 2): = LTGs  Skilled Therapeutic Interventions/Progress Updates:    Due to pt still working on lunch at 1300 (scheduled time for session), came back 30 min later to accommodate patient.  Session focused on functional w/c mobility on unit on tiled and carpeted surfaces for functional strengthening and endurance of BUE with S, gait training x 5' with mod A due to decreased foot clearance on R and positioning of RW (keeping it too far out in front of patient), seated and supine LE therex including R LAQ, R marches, R heel slides, L hip flexion and L abduction x 15 reps x 2 sets each with 2# ankle weight on RLE, seated core strengthening and ROM activities to aid with overall mobility and improved posture using red weighted medicine ball (1lb) in PNF diagonals and trunk rotation x 10 reps each each direction, and functional transfer training (min to mod A needed) with increased difficulty today due to impaired motor planning. Pt demonstrating increased physical A needed today due to impaired motor planning. Left up in w/c end of session with safety belt donned and all needs in reach.   Therapy Documentation Precautions:  Precautions Precautions: Fall Precaution Comments: contact Restrictions Weight Bearing Restrictions: Yes LLE Weight Bearing: Non weight bearing Therapy Vitals Temp: 97.7 F (36.5 C) Temp Source: Oral Pulse Rate: 63 Resp: 20 BP: (!) 107/53 mmHg Patient Position (if appropriate): Sitting Oxygen Therapy SpO2: 100 % O2 Device: Not Delivered Pain:  No complaints.  See FIM for current functional status  Therapy/Group: Individual Therapy  Canary Brim Ivory Broad, PT, DPT  11/03/2014, 3:32 PM

## 2014-11-03 NOTE — Progress Notes (Signed)
[place] PHYSICAL MEDICINE & REHABILITATION     PROGRESS NOTE    Subjective/Complaints:   ROS   Objective: Vital Signs: Blood pressure 142/62, pulse 66, temperature 98.4 F (36.9 C), temperature source Oral, resp. rate 18, weight 69.6 kg (153 lb 7 oz), SpO2 100 %. No results found. No results for input(s): WBC, HGB, HCT, PLT in the last 72 hours. No results for input(s): NA, K, CL, GLUCOSE, BUN, CREATININE, CALCIUM in the last 72 hours.  Invalid input(s): CO CBG (last 3)  No results for input(s): GLUCAP in the last 72 hours.  Wt Readings from Last 3 Encounters:  11/03/14 69.6 kg (153 lb 7 oz)  10/18/14 61.2 kg (134 lb 14.7 oz)  10/07/14 70.6 kg (155 lb 10.3 oz)    Physical Exam:  Constitutional: He is oriented to person, place,. He appears well-developed and well-nourished.  HENT: dentition poor. Mucosa fairly moist Head: Normocephalic and atraumatic.  Eyes: Conjunctivae are normal. Pupils are equal, round, and reactive to light.  Neck: Normal range of motion. Neck supple.  Cardiovascular: Normal rate and regular rhythm.no murmur Respiratory: Effort normal and breath sounds normal. No respiratory distress. He has no wheezes.  GI: Soft. Bowel sounds are normal. He exhibits no distension. There is no tenderness.  Musculoskeletal: He exhibits edema.  RLE with trace to  1+edema  Neurological: He is alert and oriented to person, place, and time. Improved insight and awareness. UE 4/5 prox to distal. RLE: 3/5 hf, ke and 4/5 adf/apf, LLE: 3/5 HF,  Skin: Skin is warm and dry. Psych: doesn't want to engage much  Assessment/Plan: 1. Functional deficits secondary to left AKA which require 15 hours over 7 days/week of interdisciplinary therapy in a comprehensive inpatient rehab setting. Intensity adjusted to better accommodate the patient's activity tolerance. Physiatrist is providing close team supervision and 24 hour management of active medical problems listed  below. Physiatrist and rehab team continue to assess barriers to discharge/monitor patient progress toward functional and medical goals. FIM: FIM - Bathing Bathing Steps Patient Completed: Chest, Right Arm, Left Arm, Abdomen, Front perineal area, Right upper leg Bathing: 3: Mod-Patient completes 5-7 15f 10 parts or 50-74%  FIM - Upper Body Dressing/Undressing Upper body dressing/undressing steps patient completed: Thread/unthread right sleeve of pullover shirt/dresss, Thread/unthread left sleeve of pullover shirt/dress, Put head through opening of pull over shirt/dress, Pull shirt over trunk, Thread/unthread right sleeve of front closure shirt/dress, Thread/unthread left sleeve of front closure shirt/dress, Pull shirt around back of front closure shirt/dress, Button/unbutton shirt Upper body dressing/undressing: 5: Set-up assist to: Obtain clothing/put away FIM - Lower Body Dressing/Undressing Lower body dressing/undressing steps patient completed: Thread/unthread right underwear leg, Thread/unthread left underwear leg, Thread/unthread right pants leg, Thread/unthread left pants leg Lower body dressing/undressing: 2: Max-Patient completed 25-49% of tasks  FIM - Toileting Toileting steps completed by patient: Performs perineal hygiene Toileting Assistive Devices: Grab bar or rail for support Toileting: 2: Max-Patient completed 1 of 3 steps  FIM - Radio producer Devices: Grab bars, Bedside commode Toilet Transfers: 3-To toilet/BSC: Mod A (lift or lower assist), 3-From toilet/BSC: Mod A (lift or lower assist)  FIM - Control and instrumentation engineer Devices: Walker, Bed rails, Arm rests Bed/Chair Transfer: 5: Supine > Sit: Supervision (verbal cues/safety issues), 3: Bed > Chair or W/C: Mod A (lift or lower assist)  FIM - Locomotion: Wheelchair Distance: 100 Locomotion: Wheelchair: 5: Travels 150 ft or more: maneuvers on rugs and over door sills with  supervision, cueing  or coaxing FIM - Locomotion: Ambulation Locomotion: Ambulation Assistive Devices: Administrator Ambulation/Gait Assistance: 4: Min assist Locomotion: Ambulation: 1: Travels less than 50 ft with minimal assistance (Pt.>75%)  Comprehension Comprehension Mode: Auditory Comprehension: 4-Understands basic 75 - 89% of the time/requires cueing 10 - 24% of the time  Expression Expression Mode: Verbal Expression: 3-Expresses basic 50 - 74% of the time/requires cueing 25 - 50% of the time. Needs to repeat parts of sentences.  Social Interaction Social Interaction: 4-Interacts appropriately 75 - 89% of the time - Needs redirection for appropriate language or to initiate interaction.  Problem Solving Problem Solving: 2-Solves basic 25 - 49% of the time - needs direction more than half the time to initiate, plan or complete simple activities  Memory Memory: 2-Recognizes or recalls 25 - 49% of the time/requires cueing 51 - 75% of the time   Medical Problem List and Plan: 1. Functional deficits secondary to L-AKA due to severe PVD with gangrenous changes.   -placement?  OT notes indicate Max A LE ADL 2. H/o PE/DVT/Anticoagulation: Pharmaceutical: Coumadin---continue for now---level still supra- therapeutic--adjust per pharmacy 3. Pain Management: limit oxycodone due mental status, oriented to person, place not time 4. Mood: LCSW to follow for evaluation and support.  5. Neuropsych: This patient is not capable of making decisions on his own behalf. 6. Skin/Wound Care: wound healing--  -Routine pressure relief measures.   -stump sock for basic protection  -prevalon boot right foot while in bed  -pt won't consistently wear ACE wrap, doubt pt prosthetic candidate due to age  And comorbidities 7. Fluids/Electrolytes/Nutrition:encourage PO intake.  8. ABLA:  hgb  7.7--on 10/31/14---continue fe+ supp  -needs follow up lab work on Monday 7/25 9. Non-obstructive CAD: On BB  and statin.BP/HR clinically within range 10. Dementia: Continue Aricept. Delirium improved---avoid neurosedating meds 11. CKD stage IV: Improved with hydration. Monitor for now. weights are stable 12. Combined systolic and diastolic MYT:[id number], nitrates and statin.   -weight remains around 63-64kg  -resumed low dose lasix---holding until tomorrow as BUN slightly up  -intake farily reasonable 13. BPH- On Flomax.   -ua neg, ucx reviewed: only multispecies  LOS (Days) 16 A FACE TO FACE EVALUATION WAS PERFORMED  Krishiv Sandler E 11/03/2014 9:10 AM

## 2014-11-04 ENCOUNTER — Inpatient Hospital Stay (HOSPITAL_COMMUNITY): Payer: Medicare Other

## 2014-11-04 ENCOUNTER — Inpatient Hospital Stay (HOSPITAL_COMMUNITY): Payer: Medicare Other | Admitting: Occupational Therapy

## 2014-11-04 ENCOUNTER — Inpatient Hospital Stay
Admission: RE | Admit: 2014-11-04 | Discharge: 2014-12-11 | Disposition: A | Discharge status: Home / Self Care | Payer: Medicare Other | Source: Ambulatory Visit | Attending: Internal Medicine | Admitting: Internal Medicine

## 2014-11-04 DIAGNOSIS — R609 Edema, unspecified: Secondary | ICD-10-CM

## 2014-11-04 DIAGNOSIS — M7989 Other specified soft tissue disorders: Principal | ICD-10-CM

## 2014-11-04 LAB — CBC
HCT: 27.4 % — ABNORMAL LOW (ref 39.0–52.0)
Hemoglobin: 8.6 g/dL — ABNORMAL LOW (ref 13.0–17.0)
MCH: 27.2 pg (ref 26.0–34.0)
MCHC: 31.4 g/dL (ref 30.0–36.0)
MCV: 86.7 fL (ref 78.0–100.0)
Platelets: 208 10*3/uL (ref 150–400)
RBC: 3.16 MIL/uL — ABNORMAL LOW (ref 4.22–5.81)
RDW: 16.8 % — ABNORMAL HIGH (ref 11.5–15.5)
WBC: 4.4 10*3/uL (ref 4.0–10.5)

## 2014-11-04 LAB — PROTIME-INR
INR: 2.09 — ABNORMAL HIGH (ref 0.00–1.49)
Prothrombin Time: 23.3 seconds — ABNORMAL HIGH (ref 11.6–15.2)

## 2014-11-04 MED ORDER — ENSURE ENLIVE PO LIQD: 237.0000 mL | Freq: Two times a day (BID) | ORAL | Status: DC

## 2014-11-04 MED ORDER — THIAMINE HCL 100 MG PO TABS: 100.0000 mg | ORAL_TABLET | Freq: Every day | ORAL | Status: DC

## 2014-11-04 MED ORDER — PANTOPRAZOLE SODIUM 40 MG PO TBEC: 40.0000 mg | DELAYED_RELEASE_TABLET | Freq: Every day | ORAL | Status: DC

## 2014-11-04 MED ORDER — WARFARIN SODIUM 5 MG PO TABS: 5.0000 mg | ORAL_TABLET | Freq: Every day | ORAL | Status: DC

## 2014-11-04 MED ORDER — OXYCODONE HCL 5 MG PO TABS: 2.5000 mg | ORAL_TABLET | Freq: Four times a day (QID) | ORAL | Status: DC | PRN

## 2014-11-04 MED ORDER — WARFARIN SODIUM 5 MG PO TABS: 5.0000 mg | ORAL_TABLET | Freq: Once | ORAL | Status: DC

## 2014-11-04 MED ORDER — HYDROCERIN EX CREA: 1.0000 "application " | TOPICAL_CREAM | Freq: Two times a day (BID) | CUTANEOUS | Status: DC

## 2014-11-04 MED ORDER — ALUM & MAG HYDROXIDE-SIMETH 200-200-20 MG/5ML PO SUSP: 30.0000 mL | Freq: Four times a day (QID) | ORAL | Status: DC | PRN

## 2014-11-04 MED ORDER — METOPROLOL SUCCINATE ER 50 MG PO TB24: 50.0000 mg | ORAL_TABLET | Freq: Every day | ORAL | Status: DC

## 2014-11-04 MED ORDER — TRAZODONE HCL 50 MG PO TABS: 25.0000 mg | ORAL_TABLET | Freq: Every evening | ORAL | Status: DC | PRN

## 2014-11-04 MED ORDER — FUROSEMIDE 20 MG PO TABS: 20.0000 mg | ORAL_TABLET | Freq: Every day | ORAL | Status: DC

## 2014-11-04 MED ORDER — FOLIC ACID 1 MG PO TABS: 1.0000 mg | ORAL_TABLET | Freq: Every day | ORAL | Status: DC

## 2014-11-04 NOTE — Progress Notes (Signed)
Social Work  Discharge Note  The overall goal for the admission was met for:   Discharge location: NO - plan changed to SNF per pt and family choice  Length of Stay: No - extended slightly due to SNF bed search - LOS=17 days  Discharge activity level: Yes - supervision/ min assist  Home/community participation: Yes  Services provided included: MD, RD, PT, OT, RN, TR, Pharmacy and SW  Financial Services: Medicare and Private Insurance: [place]  Follow-up services arranged: Other: SNF @ [facility]  Comments (or additional information):  Patient/Family verbalized understanding of follow-up arrangements: Yes  Individual responsible for coordination of the follow-up plan: pt  Confirmed correct DME delivered: NA    Adaleah Forget

## 2014-11-04 NOTE — Plan of Care (Signed)
Problem: RH Ambulation Goal: LTG Patient will ambulate in controlled environment (PT) LTG: Patient will ambulate in a controlled environment, # of feet with assistance (PT).  Outcome: Adequate for Discharge Pt has been inconsistent with this and requiring more help last few days. Min to mod A up to 10' last few days.

## 2014-11-04 NOTE — Progress Notes (Signed)
Occupational Therapy Session Note  Patient Details  Name: Scott [name] MRN: [id number] Date of Birth: 05/13/1921  Today's Date: 11/04/2014 OT Individual Time: 1000-1100 OT Individual Time Calculation (min): 60 min    Short Term Goals: Week 2:  OT Short Term Goal 1 (Week 2): LTG=STG  Skilled Therapeutic Interventions/Progress Updates:    Pt seen for OT ADL bathing and dressing session. Pt in w/c upon arrival, having just finished with PT, agreeable to tx. Pt transferred w/c> toilet with supervision using grab bars via modified stand pivot transfer with VCs for technique/ hand placement. Shower transfer completed in similar manner with steadying assist provided. Step stool used to doff R sock and shoe as pt with decreased sitting balance and functional reach to access foot.  Pt completed seated bathing task, requiring steadying assist when standing to complete buttock hygiene.  Pt dressed seated in w/c at sink with assist for LB clothing management. Pt voiced return to bed at end of session due to fatigue. He completed stand pivot transfer using RW to bed and completed posterior scoot with VCs for technique. Pt left in supine at end of session, all needs in reach.    Therapy Documentation Precautions:  Precautions Precautions: Fall Precaution Comments: contact Restrictions Weight Bearing Restrictions: Yes LLE Weight Bearing: Non weight bearing Pain: Pain Assessment Pain Assessment: No/denies pain  See FIM for current functional status  Therapy/Group: Individual Therapy  Lewis, Leandrea Ackley C 11/04/2014, 7:14 AM

## 2014-11-04 NOTE — Progress Notes (Signed)
Physical Therapy Discharge Summary  Patient Details  Name: Scott [name] MRN: [id number] Date of Birth: 18-Nov-1921   Patient has met 8 of 9 long term goals due to improved activity tolerance, improved balance, increased strength, decreased pain, ability to compensate for deficits and improved awareness.  Patient to discharge at a wheelchair level [place].   Pt is to d/c to SNF due to needing 24/7 supervision assist.   Reasons goals not met: Gait goal not met due to requiring increased A last couple days and not going quite 15'.  Recommendation:  Patient will benefit from ongoing skilled PT services in skilled nursing facility setting to continue to advance safe functional mobility, address ongoing impairments in strength, gait, balance, endurance, cognition, functional mobilty, and minimize fall risk.  Equipment: TBD at next venue of care. Pt has personal w/c.  Reasons for discharge: discharge from hospital  Patient/family agrees with progress made and goals achieved: Yes  PT Discharge Precautions/Restrictions Precautions Precautions: Fall Precaution Comments: contact; L BKA Restrictions Weight Bearing Restrictions: Yes LLE Weight Bearing: Non weight bearing Cognition Overall Cognitive Status: Impaired/Different from baseline Memory: Impaired Memory Impairment: Decreased recall of new information Safety/Judgment: Impaired Sensation Sensation Light Touch: Impaired Detail Light Touch Impaired Details: Impaired LLE Motor  Motor Motor: Abnormal postural alignment and control  Trunk/Postural Assessment  Cervical Assessment Cervical Assessment:  (forward head) Thoracic Assessment Thoracic Assessment:  (flexed posture) Lumbar Assessment Lumbar Assessment:  (posterior pelvic tilt) Postural Control Postural Control: Deficits on evaluation Protective Responses: delayed in standing Postural Limitations: maintains flexed posture  Balance Balance Balance Assessed:  Yes Static Sitting Balance Static Sitting - Level of Assistance: 6: Modified independent (Device/Increase time) Dynamic Sitting Balance Dynamic Sitting - Level of Assistance: 5: Stand by assistance Static Standing Balance Static Standing - Level of Assistance: 5: Stand by assistance;4: Min assist Dynamic Standing Balance Dynamic Standing - Level of Assistance: 4: Min assist Extremity Assessment      RLE Assessment RLE Assessment: Exceptions to [facility] (grossly 3+/5) LLE Assessment LLE Assessment: Exceptions to [facility] (L BKA grossly 3+/5)  See FIM for current functional status  Canary Brim Ivory Broad, PT, DPT  11/04/2014, 12:17 PM

## 2014-11-04 NOTE — Progress Notes (Signed)
Physical Therapy Session Note  Patient Details  Name: Scott [name] MRN: [id number] Date of Birth: Sep 28, 1921  Today's Date: 11/04/2014 PT Individual Time: 0900-1000 PT Individual Time Calculation (min): 60 min   Short Term Goals: Week 3:  PT Short Term Goal 1 (Week 3): = LTGs (awaiting SNF placement)  Skilled Therapeutic Interventions/Progress Updates:   Assisted pt with dressing to prepare for OOB with pt rolling with S in the bed and verbal cues for technique. Performed supine to sit with S and donned shirt independently EOB. Performed sit to stand onto scale for accurate weight by RN with min A for balance and cues for hand placement. Pt performed transfers during session with min A and extra time for motor planning and cues for hand placement/technique. W/c propulsion for general UE strengthening on unit with S overall and cues for improved efficiency of propulsion. Simulated car transfer to sedan height with extra time for motor planning and sequencing but able to perform at min A level today squat pivot technique. Pt with heavy min A for gait today but only able to go ~ 6' again due to decreased clearance of R foot and forward lean and increasing fall risk.  Left up in w/c with all needs in reach and safety belt donned awaiting OT for next session. See d/c summary for d/c assessment performed during session.  Therapy Documentation Precautions:  Precautions Precautions: Fall Precaution Comments: contact; L BKA Restrictions Weight Bearing Restrictions: Yes LLE Weight Bearing: Non weight bearing Pain: Pain Assessment Pain Assessment: No/denies pain   See FIM for current functional status  Therapy/Group: Individual Therapy  Canary Brim Ivory Broad, PT, DPT  11/04/2014, 12:09 PM

## 2014-11-04 NOTE — Progress Notes (Signed)
Occupational Therapy Discharge Summary  Patient Details  Name: Scott [name] MRN: [id number] Date of Birth: [birth year]   Patient has met 9 of 9 long term goals due to improved activity tolerance, improved balance, postural control and ability to compensate for deficits.  Patient requires various levels of assist depending on fatigue and arousal levels. Pt supervision- min A when alert, however, requires mod-max A when fatigued .  Patient is discharging to SNF in order to cont to increase independence with ADLs.   Recommendation:  Patient will benefit from ongoing skilled OT services in skilled nursing facility setting to continue to advance functional skills in the area of BADL and Reduce care partner burden.  Equipment: No equipment provided  Reasons for discharge: treatment goals met and discharge from hospital  Patient/family agrees with progress made and goals achieved: Yes  OT Discharge Precautions/Restrictions  Precautions Precautions: Fall Precaution Comments: contact; L BKA Restrictions Weight Bearing Restrictions: Yes LLE Weight Bearing: Non weight bearing Vision/Perception  Vision- History Baseline Vision/History: Wears glasses Wears Glasses: At all times Patient Visual Report: No change from baseline  Cognition Overall Cognitive Status: Impaired/Different from baseline Arousal/Alertness: Lethargic Orientation Level: Oriented to person;Oriented to place;Disoriented to time;Disoriented to situation Sustained Attention Impairment: Verbal complex;Verbal basic;Functional basic Memory: Impaired Memory Impairment: Decreased recall of new information Problem Solving: Impaired Safety/Judgment: Impaired Sensation Sensation Light Touch: Impaired Detail Light Touch Impaired Details: Impaired LLE Motor  Motor Motor: Abnormal postural alignment and control Motor - Discharge Observations: Slouched sitting posture Mobility  Transfers Transfers: Sit to Stand;Stand to  Sit Sit to Stand: 5: Supervision Sit to Stand Details: Verbal cues for safe use of DME/AE Stand to Sit: 5: Supervision  Trunk/Postural Assessment  Cervical Assessment Cervical Assessment:  (Forward head) Thoracic Assessment Thoracic Assessment:  (Flexed posture) Lumbar Assessment Lumbar Assessment:  (Posterior pelvic tilt) Postural Control Postural Control: Deficits on evaluation Protective Responses: delayed in standing Postural Limitations: maintains flexed posture  Balance Balance Balance Assessed: Yes Static Sitting Balance Static Sitting - Balance Support: Feet supported Static Sitting - Level of Assistance: 6: Modified independent (Device/Increase time) Dynamic Sitting Balance Dynamic Sitting - Balance Support: Feet supported;Right upper extremity supported;Left upper extremity supported Dynamic Sitting - Level of Assistance: 5: Stand by assistance Dynamic Sitting Balance - Compensations: Standing to compelte hygiene at sink Static Standing Balance Static Standing - Balance Support: Right upper extremity supported;Left upper extremity supported;During functional activity Static Standing - Level of Assistance: 5: Stand by assistance;4: Min assist Dynamic Standing Balance Dynamic Standing - Level of Assistance: 4: Min assist Extremity/Trunk Assessment RUE Assessment RUE Assessment: Within Functional Limits LUE Assessment LUE Assessment: Within Functional Limits  See FIM for current functional status  Lewis, Lenton Gendreau C 11/04/2014, 1:00 PM

## 2014-11-04 NOTE — Progress Notes (Signed)
[place] PHYSICAL MEDICINE & REHABILITATION     PROGRESS NOTE    Subjective/Complaints:   ROS   Objective: Vital Signs: Blood pressure 140/39, pulse 65, temperature 98.5 F (36.9 C), temperature source Oral, resp. rate 18, weight 70.1 kg (154 lb 8.7 oz), SpO2 100 %. No results found. No results for input(s): WBC, HGB, HCT, PLT in the last 72 hours. No results for input(s): NA, K, CL, GLUCOSE, BUN, CREATININE, CALCIUM in the last 72 hours.  Invalid input(s): CO CBG (last 3)  No results for input(s): GLUCAP in the last 72 hours.  Wt Readings from Last 3 Encounters:  11/04/14 70.1 kg (154 lb 8.7 oz)  10/18/14 61.2 kg (134 lb 14.7 oz)  10/07/14 70.6 kg (155 lb 10.3 oz)    Physical Exam:  Constitutional: He is oriented to person, place,. He appears well-developed and well-nourished.  HENT: dentition poor. Mucosa fairly moist Head: Normocephalic and atraumatic.  Eyes: Conjunctivae are normal. Pupils are equal, round, and reactive to light.  Neck: Normal range of motion. Neck supple.  Cardiovascular: Normal rate and regular rhythm.no murmur Respiratory: Effort normal and breath sounds normal. No respiratory distress. He has no wheezes.  GI: Soft. Bowel sounds are normal. He exhibits no distension. There is no tenderness.  Musculoskeletal: He exhibits edema.  RLE with trace to  1+edema  Neurological: He is alert and oriented to person, place, and time. Improved insight and awareness. UE 4/5 prox to distal. RLE: 3/5 hf, ke and 4/5 adf/apf, LLE: 3/5 HF,  Skin: Skin is warm and dry. Psych: doesn't want to engage much  Assessment/Plan: 1. Functional deficits secondary to left AKA which require 15 hours over 7 days/week of interdisciplinary therapy in a comprehensive inpatient rehab setting. Intensity adjusted to better accommodate the patient's activity tolerance. Physiatrist is providing close team supervision and 24 hour management of active medical problems listed  below. Physiatrist and rehab team continue to assess barriers to discharge/monitor patient progress toward functional and medical goals. FIM: FIM - Bathing Bathing Steps Patient Completed: Chest, Right Arm, Left Arm, Abdomen, Front perineal area, Right upper leg Bathing: 3: Mod-Patient completes 5-7 60f 10 parts or 50-74%  FIM - Upper Body Dressing/Undressing Upper body dressing/undressing steps patient completed: Thread/unthread right sleeve of pullover shirt/dresss, Thread/unthread left sleeve of pullover shirt/dress, Put head through opening of pull over shirt/dress, Pull shirt over trunk, Thread/unthread right sleeve of front closure shirt/dress, Thread/unthread left sleeve of front closure shirt/dress, Pull shirt around back of front closure shirt/dress, Button/unbutton shirt Upper body dressing/undressing: 5: Set-up assist to: Obtain clothing/put away FIM - Lower Body Dressing/Undressing Lower body dressing/undressing steps patient completed: Thread/unthread right underwear leg, Thread/unthread left underwear leg, Thread/unthread right pants leg, Thread/unthread left pants leg Lower body dressing/undressing: 2: Max-Patient completed 25-49% of tasks  FIM - Toileting Toileting steps completed by patient: Performs perineal hygiene Toileting Assistive Devices: Grab bar or rail for support Toileting: 2: Max-Patient completed 1 of 3 steps  FIM - Radio producer Devices: Grab bars, Recruitment consultant Transfers: 3-To toilet/BSC: Mod A (lift or lower assist), 3-From toilet/BSC: Mod A (lift or lower assist)  FIM - Control and instrumentation engineer Devices: Arm rests Bed/Chair Transfer: 5: Supine > Sit: Supervision (verbal cues/safety issues), 5: Sit > Supine: Supervision (verbal cues/safety issues), 4: Chair or W/C > Bed: Min A (steadying Pt. > 75%), 3: Bed > Chair or W/C: Mod A (lift or lower assist)  FIM - Locomotion: Wheelchair Distance:  100 Locomotion:  Wheelchair: 5: Travels 150 ft or more: maneuvers on rugs and over door sills with supervision, cueing or coaxing FIM - Locomotion: Ambulation Locomotion: Ambulation Assistive Devices: Administrator Ambulation/Gait Assistance: 3: Mod assist Locomotion: Ambulation: 1: Travels less than 50 ft with moderate assistance (Pt: 50 - 74%)  Comprehension Comprehension Mode: Auditory Comprehension: 4-Understands basic 75 - 89% of the time/requires cueing 10 - 24% of the time  Expression Expression Mode: Verbal Expression: 3-Expresses basic 50 - 74% of the time/requires cueing 25 - 50% of the time. Needs to repeat parts of sentences.  Social Interaction Social Interaction: 4-Interacts appropriately 75 - 89% of the time - Needs redirection for appropriate language or to initiate interaction.  Problem Solving Problem Solving: 2-Solves basic 25 - 49% of the time - needs direction more than half the time to initiate, plan or complete simple activities  Memory Memory: 2-Recognizes or recalls 25 - 49% of the time/requires cueing 51 - 75% of the time   Medical Problem List and Plan: 1. Functional deficits secondary to L-AKA due to severe PVD with gangrenous changes.   -placement today most likely.  2. H/o PE/DVT/Anticoagulation: Pharmaceutical: Coumadin---continue for now---level still supra- therapeutic--adjust per pharmacy 3. Pain Management: limit oxycodone due mental status, oriented to person, place not time 4. Mood: LCSW to follow for evaluation and support.  5. Neuropsych: This patient is not capable of making decisions on his own behalf. 6. Skin/Wound Care: wound healing--  -Routine pressure relief measures.   -stump sock for basic protection  -prevalon boot right foot while in bed---right foot,leg without any breakdown  -pt won't consistently wear ACE wrap, doubt pt prosthetic candidate due to age  And comorbidities 7. Fluids/Electrolytes/Nutrition:encourage PO  intake.  8. ABLA:  hgb  7.7--on 10/31/14---continue fe+ supp  -check cbc today 9. Non-obstructive CAD: On BB and statin.BP/HR clinically within range 10. Dementia: Continue Aricept. Delirium improved---avoiding neurosedating meds 11. CKD stage IV: Improved with hydration. Monitor for now. weights are stable 12. Combined systolic and diastolic [id number], nitrates and statin.   -weight suddenly increased 13lbs since Saturday!! (don't believe this is real)  -re-weigh pt today,  -lasix back on board as of yesterday  -intake farily reasonable 13. BPH- On Flomax.   -ua neg, ucx reviewed: only multispecies  LOS (Days) 17 A FACE TO FACE EVALUATION WAS PERFORMED  Scott [name] T 11/04/2014 8:23 AM

## 2014-11-04 NOTE — Progress Notes (Signed)
Report called to [facility]. Son received discharge instructions from Algis Liming, PA-C with verbal understanding. Patient's son to transport patient and belongings to [facility].

## 2014-11-04 NOTE — Progress Notes (Signed)
ANTICOAGULATION CONSULT NOTE - Follow Up Consult  Pharmacy Consult for coumadin Indication: hx of DVT/PE  Allergies  Allergen Reactions  . Lipitor [Atorvastatin] Other (See Comments)    unknown    Patient Measurements: Weight: 154 lb 8.7 oz (70.1 kg) Heparin Dosing Weight:   Vital Signs: Temp: 98.5 F (36.9 C) (07/25 0615) Temp Source: Oral (07/25 0615) BP: 140/39 mmHg (07/25 0615) Pulse Rate: 65 (07/25 0615)  Labs:  Recent Labs  11/02/14 0501 11/03/14 0510 11/04/14 0531  LABPROT 29.4* 24.9* 23.3*  INR 2.84* 2.29* 2.09*    Estimated Creatinine Clearance: 45.9 mL/min (by C-G formula based on Cr of 1.04).   Medications:  Scheduled:  . donepezil  10 mg Oral QHS  . febuxostat  40 mg Oral Daily  . feeding supplement (ENSURE ENLIVE)  237 mL Oral BID BM  . folic acid  1 mg Oral Daily  . furosemide  20 mg Oral Daily  . gabapentin  200 mg Oral BID  . hydrocerin   Topical BID  . iron polysaccharides  150 mg Oral Daily  . isosorbide mononitrate  15 mg Oral Daily  . metoprolol succinate  50 mg Oral Daily  . pantoprazole  40 mg Oral Daily  . simvastatin  10 mg Oral Daily  . tamsulosin  0.4 mg Oral QPC supper  . thiamine  100 mg Oral Daily  . Warfarin - Pharmacist Dosing Inpatient   Does not apply q1800   Infusions:    Assessment: 81 yo male with hx of DVT/PE is currently on therapeutic coumadin.  INR is down a little to 2.09. Goal of Therapy:  INR 2-3 Monitor platelets by anticoagulation protocol: Yes   Plan:  - Coumadin 5mg  po x 1 - INR in am - Will continue to monitor for any signs/symptoms of bleeding    Kailena Lubas, Tsz-Yin 11/04/2014,8:12 AM

## 2014-11-05 ENCOUNTER — Encounter (HOSPITAL_COMMUNITY)
Admission: AD | Admit: 2014-11-05 | Discharge: 2014-11-05 | Disposition: A | Discharge status: Home / Self Care | Payer: Medicare Other | Source: Skilled Nursing Facility | Attending: Internal Medicine | Admitting: Internal Medicine

## 2014-11-05 LAB — BASIC METABOLIC PANEL
Anion gap: 6 (ref 5–15)
BUN: 25 mg/dL — ABNORMAL HIGH (ref 6–20)
CO2: 28 mmol/L (ref 22–32)
Calcium: 9 mg/dL (ref 8.9–10.3)
Chloride: 104 mmol/L (ref 101–111)
Creatinine, Ser: 0.91 mg/dL (ref 0.61–1.24)
GFR calc Af Amer: >60 mL/min (ref >60)
GFR calc non Af Amer: >60 mL/min (ref >60)
Glucose, Bld: 81 mg/dL (ref 65–99)
Potassium: 4 mmol/L (ref 3.5–5.1)
Sodium: 138 mmol/L (ref 135–145)

## 2014-11-05 LAB — PROTIME-INR
INR: 2.09 — ABNORMAL HIGH (ref 0.00–1.49)
Prothrombin Time: 23.3 seconds — ABNORMAL HIGH (ref 11.6–15.2)

## 2014-11-06 ENCOUNTER — Non-Acute Institutional Stay (SKILLED_NURSING_FACILITY): Payer: Medicare Other | Admitting: Internal Medicine

## 2014-11-06 DIAGNOSIS — I255 Ischemic cardiomyopathy: Secondary | ICD-10-CM | POA: Diagnosis not present

## 2014-11-06 DIAGNOSIS — R29898 Other symptoms and signs involving the musculoskeletal system: Secondary | ICD-10-CM | POA: Diagnosis not present

## 2014-11-06 DIAGNOSIS — D500 Iron deficiency anemia secondary to blood loss (chronic): Secondary | ICD-10-CM | POA: Diagnosis not present

## 2014-11-06 DIAGNOSIS — F332 Major depressive disorder, recurrent severe without psychotic features: Secondary | ICD-10-CM

## 2014-11-06 DIAGNOSIS — Z89612 Acquired absence of left leg above knee: Secondary | ICD-10-CM

## 2014-11-06 NOTE — Progress Notes (Signed)
Patient ID: Scott [name], male   DOB: Sep 25, 1921, 81 y.o.   MRN: [id number]  Facility; [facility] Chief complaint; admission to SNF post admit to [facility] from 7/8 to 7/25 from rehabilitation medicine. He was previously at [facility] on 7/6 had a left AKA. He was also admitted from 6/23 through 6/28 with sepsis aspiration pneumonia.  History; this is a 81 year old man I had previously seen in the wound care center in June. He had gangrene of his left foot. He was not amenable to revascularization. At that point the patient and his son were not ready for an amputation and we ultimately referred them to [facility] I believe for a second opinion. I have not seen him since then.  He was admitted to hospital from 6/23 through 6/28 with sepsis aspiration pneumonia paroxysmal atrial fibrillation versus a wide complex tachycardia. It was noted that he has a non-ischemic cardiomyopathy but apparently his most recent echocardiogram showed improvement. He would appear that he was readmitted on 7/6 for a left above-knee amputation. The patient had unrelenting pain secondary to gangrene of his left foot.  In rehabilitation medicine the patient was apparently able to do a pivot transfer on his remaining right leg. However over the last several days his son and noted an increasing degree of weakness, lethargy, lack of energy. He is worried about some degree of depression.  Past Medical History  Diagnosis Date  . HTN (hypertension)   . Hyperlipidemia   . PVCs (premature ventricular contractions)   . Glaucoma   . PVD (peripheral vascular disease)     Right ABI .75, Left .78 (2006)  . Popliteal artery aneurysm, bilateral DECUMENTED CHRONIC PARTIAL OCCLUSION--  PT DENIES CLAUDICATION OR ANY OTHER SYMPTOMS  . Frequency of urination   . Nocturia   . DVT, bilateral lower limbs 03/2013    a. Bilat DVT dx 03/2013.  . Pulmonary embolism     a. By CT angio 04/2013.  Marland Kitchen Elevated troponin     a. Adm 12/30-04/2013 - not felt to  represent ACS; ?due to PE. b. Patent cors 06/2012.  Marland Kitchen NSVT (nonsustained ventricular tachycardia)     a. NSVT 06/2012; NSVT also seen during 03/2013 adm. b. Med rx. Not candidate for ICD given adv age.  Marland Kitchen ETOH abuse   . Syncope     a. Felt to be postural syncope related to diuretics 06/2012.  Marland Kitchen Heart murmur   . DVT (deep venous thrombosis)     "I think in both legs"  . Pneumonia     "couple times"  . DDD (degenerative disc disease)   . Arthritis     "all over"  . Depression   . CAD- non obstructive disease by cath 3/14   . PAD (peripheral artery disease)   . Gangrene of toe 10/03/2014  . Sepsis due to other etiology 10/03/2014  . Chronic systolic heart failure 07/09/90    EF improved 50-55%  . Chronic systolic CHF (congestive heart failure)     a. NICM - patent cors 06/2012, EF 40% at that time. b. 03/2013 eval: EF 20-25%.  . Chronic lower back pain   . Bladder cancer 03/15/2012    pt denies this hx on 10/15/2014    Past Surgical History  Procedure Laterality Date  . Transthoracic echocardiogram  09-22-2010    MODERATE LVH/ EF 42%/ GRADE I DIASTOLIC DYSFUNCTION/ AORTIC SCLEROSIS WITHOUT STENOSIS/  RV  SYSTOLIC  MILDLY REDUCED FUNCTION  . Cardiovascular stress test  10-15-2010  LOW RISK NUCLEAR STUDY/ NO EVIDENCE OF ISCHEMIA/ NORMAL EF  . Shoulder surgery  1970's    "don't remember which side or what kind of OR"  . Transurethral resection of bladder tumor  10/07/2011    Procedure: TRANSURETHRAL RESECTION OF BLADDER TUMOR (TURBT);  Surgeon: Malka So, MD;  Location[facility];  Service: Urology;  Laterality: N/A;  . Cystoscopy  10/07/2011    Procedure: CYSTOSCOPY;  Surgeon: Malka So, MD;  Location: [facility];  Service: Urology;  Laterality: N/A;  . Cystoscopy with biopsy  03/14/2012    Procedure: CYSTOSCOPY WITH BIOPSY;  Surgeon: Malka So, MD;  Location: [facility];  Service: Urology;  Laterality: N/A;  WITH FULGURATION  . Transurethral resection  of prostate  03/14/2012    Procedure: TRANSURETHRAL RESECTION OF THE PROSTATE WITH GYRUS INSTRUMENTS;  Surgeon: Malka So, MD;  Location: [facility];  Service: Urology;  Laterality: N/A;  . Tonsillectomy    . Cataract extraction w/ intraocular lens  implant, bilateral Bilateral   . Cardiac catheterization  05-11-2004  DR [place] PLAQUE/ NORMAL LVF/ EF 55%/  NON-OBSTRUCTIVE LAD 25%  . Cardiac catheterization  06/2012  . Eye surgery    . Amputation Left 10/16/2014    Procedure: AMPUTATION ABOVE KNEE- LEFT;  Surgeon: Mal Misty, MD;  Location: [facility];  Service: Vascular;  Laterality: Left;    Current Outpatient Prescriptions on File Prior to Visit  Medication Sig Dispense Refill  . alum & mag hydroxide-simeth (MAALOX/MYLANTA) 200-200-20 MG/5ML suspension Take 30 mLs by mouth every 6 (six) hours as needed for indigestion or heartburn (dyspepsia). 355 mL 0  . bisacodyl (DULCOLAX) 10 MG suppository Place 1 suppository (10 mg total) rectally daily as needed for moderate constipation. 12 suppository 0  . donepezil (ARICEPT) 10 MG tablet TAKE ONE TABLET AT BEDTIME 30 tablet 2  . feeding supplement, ENSURE ENLIVE, (ENSURE ENLIVE) LIQD Take 237 mLs by mouth 2 (two) times daily between meals. 237 mL 12  . ferrous sulfate (FERROUSUL) 325 (65 FE) MG tablet Take 1 tablet (325 mg total) by mouth daily with breakfast.  3  . fluticasone (FLONASE) 50 MCG/ACT nasal spray Place 2 sprays into both nostrils daily. 16 g 6  . folic acid (FOLVITE) 1 MG tablet Take 1 tablet (1 mg total) by mouth daily.    . furosemide (LASIX) 20 MG tablet Take 1 tablet (20 mg total) by mouth daily.    Marland Kitchen gabapentin (NEURONTIN) 100 MG capsule TAKE (2) CAPSULES TWICE DAILY. 120 capsule 0  . hydrocerin (EUCERIN) CREA Apply 1 application topically 2 (two) times daily.  0  . isosorbide mononitrate (IMDUR) 30 MG 24 hr tablet Take 0.5 tablets (15 mg total) by mouth daily. 30 tablet 5  . metoprolol succinate (TOPROL-XL) 50  MG 24 hr tablet Take 1 tablet (50 mg total) by mouth daily. Take with or immediately following a meal.    . nitroGLYCERIN (NITROSTAT) 0.4 MG SL tablet Place 1 tablet (0.4 mg total) under the tongue every 5 (five) minutes as needed for chest pain (up to 3 doses). 25 tablet 3  . oxyCODONE (OXY IR/ROXICODONE) 5 MG immediate release tablet Take 0.5 tablets (2.5 mg total) by mouth every 6 (six) hours as needed for moderate pain. 5 tablet 0  . pantoprazole (PROTONIX) 40 MG tablet Take 1 tablet (40 mg total) by mouth daily.    . simvastatin (ZOCOR) 10 MG tablet TAKE 1 TABLET  DAILY 30 tablet 4  . tamsulosin (FLOMAX) 0.4 MG CAPS capsule TAKE (1) CAPSULE DAILY 30 capsule 5  . thiamine 100 MG tablet Take 1 tablet (100 mg total) by mouth daily.    . traZODone (DESYREL) 50 MG tablet Take 0.5-1 tablets (25-50 mg total) by mouth at bedtime as needed for sleep.    Marland Kitchen ULORIC 40 MG tablet TAKE 1 TABLET DAILY 30 tablet 4  . warfarin (COUMADIN) 5 MG tablet Take 1 tablet (5 mg total) by mouth daily at 6 PM.      Social; the patient lives in [place] with his wife. His wife actually has chronic disability secondary to paralysis that happened in 2012. The patient's exact functional status prior to this is not completely clear at the moment.  reports that he quit smoking about 39 years ago. His smoking use included Cigarettes. He started smoking about 72 years ago. He has a 20 pack-year smoking history. He has never used smokeless tobacco. He reports that he does not drink alcohol or use illicit drugs.  family history includes Arthritis in his mother; Deep vein thrombosis in his father; Early death in his brother; Hypertension in his mother; Lung cancer in his sister.  Review of systems HEENT; no oral pain or swallowing difficulties Respiratory; no cough or sputum Cardiac no chest pain GI no abdominal pain no diarrhea states he had a bowel movement yesterday. It would appear that some time before this hospitalization  he was on Megace as an appetite stimulant he is not on that currently. GU no dysuria; I note that he is on Flomax. Skin; "sheer" injury to his right buttock Psychiatry; son reports the patient has been somewhat depressed since the patient's wife had her injury in 2012. No prior psychiatric history. He notes that he has been somewhat more desponded intact, less communicative, less energy level although this appears to  have occurred while he was on rehabilitation. Neurologic; no focal weakness is noted  Physical examination Gen. patient is not in any overt distress however he keeps his eyes closed during conversation. Seems somewhat desponded into. Vitals; pulse 71 respirations 18 and unlabored O2 sat 94% on room air. HEENT; purulent remaining teeth. No oral lesions were seen Respiratory clear entry bilaterally no wheezing Cardiac heart sounds are normal no gallops no increase in jugular venous pressure he appears to be euvolemic Abdomen; mildly distended no liver no spleen no masses and no tenderness GU; there is no bladder distention no suprapubic tenderness and no CVA tenderness Musculoskeletal; no evidence of acute gout or acute arthritis Vascular; in the right leg there is a strong femoral pulse no popliteal pulse no pulses palpable at the dorsalis pedis or posterior tibial. However his forefoot is warm. Neurologic; there is no pronator drift. 3+ out of 5 strength in the right hip flexor but abductors seem better. Distal strength in the right leg is normal. Mental status; really depressed affect that. He seems disengaged in conversation looks very unhappy, fatigued  Impression/plan #1 status post very proximal left above-knee amputation for severe peripheral vascular disease and gangrene of his left foot his surgical incision looks stable Staples are still in. #2 significant PAD is likely in the remaining right leg. He has had vascular assessments. Don't see his current vascular studies.  There is nothing currently threatening in his right foot. #3 history of dementia on Aricept. I am not completely certain of the severity here #4; likely a significant depression. I suspect he will need antidepressive and  close follow-up. #5; right leg weakness which is son states occurred during the last week at rehabilitation. I do not see anything focal here.  I wonder if this is simply disuse which seems to fit the pattern. Check a total CK on statins. He is not a diabetic. #6 history of DVT PE on chronic Coumadin #7 history of nonsustained ventricular tachycardia not felt to be a candidate for defibrillator placement. #8  Anemia. It would appear that his hemoglobin is dropped postoperatively. He is on iron once a day I will check that next week. #9 BPH on Flomax. I see no problems here. History of gout on Uloric. Again I see no active problems here. #10 coronary artery disease; this does not seem unstable. #11 hypertension this also seems stable.  I will check this man's lab work next week. I'm going to start him on an antidepressive and. Have him seen by psychiatry. I'm not certain as to the cause of the decline in his right lower extremity strength however the degree is worrisome here. I would like to know what he is able to do in physical therapy.

## 2014-11-10 ENCOUNTER — Encounter (INDEPENDENT_AMBULATORY_CARE_PROVIDER_SITE_OTHER): Payer: Medicare Other | Admitting: Family Medicine

## 2014-11-10 DIAGNOSIS — I70262 Atherosclerosis of native arteries of extremities with gangrene, left leg: Secondary | ICD-10-CM | POA: Diagnosis not present

## 2014-11-10 DIAGNOSIS — L97529 Non-pressure chronic ulcer of other part of left foot with unspecified severity: Secondary | ICD-10-CM

## 2014-11-10 DIAGNOSIS — I480 Paroxysmal atrial fibrillation: Secondary | ICD-10-CM | POA: Diagnosis not present

## 2014-11-10 DIAGNOSIS — I5022 Chronic systolic (congestive) heart failure: Secondary | ICD-10-CM

## 2014-11-11 ENCOUNTER — Encounter (HOSPITAL_COMMUNITY)
Admission: AD | Admit: 2014-11-11 | Discharge: 2014-11-11 | Disposition: A | Discharge status: Home / Self Care | Payer: Medicare Other | Source: Skilled Nursing Facility | Attending: Internal Medicine | Admitting: Internal Medicine

## 2014-11-11 LAB — COMPREHENSIVE METABOLIC PANEL
ALT: 13 U/L — ABNORMAL LOW (ref 17–63)
AST: 19 U/L (ref 15–41)
Albumin: 3 g/dL — ABNORMAL LOW (ref 3.5–5.0)
Alkaline Phosphatase: 125 U/L (ref 38–126)
Anion gap: 5 (ref 5–15)
BUN: 18 mg/dL (ref 6–20)
CO2: 27 mmol/L (ref 22–32)
Calcium: 9.1 mg/dL (ref 8.9–10.3)
Chloride: 107 mmol/L (ref 101–111)
Creatinine, Ser: 0.87 mg/dL (ref 0.61–1.24)
GFR calc Af Amer: >60 mL/min (ref >60)
GFR calc non Af Amer: >60 mL/min (ref >60)
Glucose, Bld: 82 mg/dL (ref 65–99)
Potassium: 3.8 mmol/L (ref 3.5–5.1)
Sodium: 139 mmol/L (ref 135–145)
Total Bilirubin: 0.5 mg/dL (ref 0.3–1.2)
Total Protein: 5.9 g/dL — ABNORMAL LOW (ref 6.5–8.1)

## 2014-11-11 LAB — CBC
HCT: 29 % — ABNORMAL LOW (ref 39.0–52.0)
Hemoglobin: 9.1 g/dL — ABNORMAL LOW (ref 13.0–17.0)
MCH: 27.2 pg (ref 26.0–34.0)
MCHC: 31.4 g/dL (ref 30.0–36.0)
MCV: 86.6 fL (ref 78.0–100.0)
Platelets: 176 10*3/uL (ref 150–400)
RBC: 3.35 MIL/uL — ABNORMAL LOW (ref 4.22–5.81)
RDW: 17 % — ABNORMAL HIGH (ref 11.5–15.5)
WBC: 3.5 10*3/uL — ABNORMAL LOW (ref 4.0–10.5)

## 2014-11-11 LAB — PROTIME-INR
INR: 2.11 — ABNORMAL HIGH (ref 0.00–1.49)
Prothrombin Time: 23.5 seconds — ABNORMAL HIGH (ref 11.6–15.2)

## 2014-11-11 LAB — CK: Total CK: 44 U/L — ABNORMAL LOW (ref 49–397)

## 2014-11-12 ENCOUNTER — Ambulatory Visit: Payer: Medicare Other | Admitting: Family Medicine

## 2014-11-13 ENCOUNTER — Encounter: Payer: Self-pay | Admitting: Internal Medicine

## 2014-11-13 ENCOUNTER — Non-Acute Institutional Stay (SKILLED_NURSING_FACILITY): Payer: Medicare Other | Admitting: Internal Medicine

## 2014-11-13 DIAGNOSIS — Z89612 Acquired absence of left leg above knee: Secondary | ICD-10-CM | POA: Diagnosis not present

## 2014-11-13 DIAGNOSIS — D62 Acute posthemorrhagic anemia: Secondary | ICD-10-CM | POA: Diagnosis not present

## 2014-11-13 DIAGNOSIS — R627 Adult failure to thrive: Secondary | ICD-10-CM

## 2014-11-13 NOTE — Progress Notes (Signed)
Patient ID: Scott [name], male   DOB: 1922-02-17, 81 y.o.   MRN: [id number]   Facility; [facility] This is an acute visit  Chief complaint; Acute visit follow-up left AKA-anemia-failure to thrive  -anticoagulation management    HPI-; this is a 81 year old man I   He was admitted to hospital from 6/23 through 6/28 with sepsis aspiration pneumonia paroxysmal atrial fibrillation versus a wide complex tachycardia. It was noted that he has a non-ischemic cardiomyopathy but apparently his most recent echocardiogram showed improvement. He would appear that he was readmitted on 7/6 for a left above-knee amputation. The patient had unrelenting pain secondary to gangrene of his left foot.  His son is asking today about his father receiving a stump shrinker-I have spoken with therapy about this and apparently  This cannot really be accomplished until staples are out of the surgical site-the surgical site itself appears to be quite benign orthopedics will be following up with with patient for staple removal   Patient also had significant anemia that dropped into the sevens previously he is now on iron and hemoglobin appears to be trending up it was 9.1 on lab done yesterday.  In regards to other issues patient is on Coumadinthe history of lower extremity DVTs-INR earlier this week was therapeutic at 2.11 and updated INR is pending on Thursday, August 4.  Patient also had a very poor appetite when assessed by Dr. Dellia Nims initially-he did order psychiatric consult and also started him on an antidepressive-this appears to be helping he is eating better looks more energetic this appears to be trending in a favorable direction but will have to be watched.  His weight appears to have stabilized at 146     Past Medical History  Diagnosis Date  . HTN (hypertension)   . Hyperlipidemia   . PVCs (premature ventricular contractions)   . Glaucoma   . PVD (peripheral vascular disease)     Right ABI .75, Left  .78 (2006)  . Popliteal artery aneurysm, bilateral DECUMENTED CHRONIC PARTIAL OCCLUSION--  PT DENIES CLAUDICATION OR ANY OTHER SYMPTOMS  . Frequency of urination   . Nocturia   . DVT, bilateral lower limbs 03/2013    a. Bilat DVT dx 03/2013.  . Pulmonary embolism     a. By CT angio 04/2013.  Marland Kitchen Elevated troponin     a. Adm 12/30-04/2013 - not felt to represent ACS; ?due to PE. b. Patent cors 06/2012.  Marland Kitchen NSVT (nonsustained ventricular tachycardia)     a. NSVT 06/2012; NSVT also seen during 03/2013 adm. b. Med rx. Not candidate for ICD given adv age.  Marland Kitchen ETOH abuse   . Syncope     a. Felt to be postural syncope related to diuretics 06/2012.  Marland Kitchen Heart murmur   . DVT (deep venous thrombosis)     "I think in both legs"  . Pneumonia     "couple times"  . DDD (degenerative disc disease)   . Arthritis     "all over"  . Depression   . CAD- non obstructive disease by cath 3/14   . PAD (peripheral artery disease)   . Gangrene of toe 10/03/2014  . Sepsis due to other etiology 10/03/2014  . Chronic systolic heart failure 0/86/76    EF improved 50-55%  . Chronic systolic CHF (congestive heart failure)     a. NICM - patent cors 06/2012, EF 40% at that time. b. 03/2013 eval: EF 20-25%.  . Chronic lower back pain   .  Bladder cancer 03/15/2012    pt denies this hx on 10/15/2014    Past Surgical History  Procedure Laterality Date  . Transthoracic echocardiogram  09-22-2010    MODERATE LVH/ EF 36%/ GRADE I DIASTOLIC DYSFUNCTION/ AORTIC SCLEROSIS WITHOUT STENOSIS/  RV  SYSTOLIC  MILDLY REDUCED FUNCTION  . Cardiovascular stress test  10-15-2010    LOW RISK NUCLEAR STUDY/ NO EVIDENCE OF ISCHEMIA/ NORMAL EF  . Shoulder surgery  1970's    "don't remember which side or what kind of OR"  . Transurethral resection of bladder tumor  10/07/2011    Procedure: TRANSURETHRAL RESECTION OF BLADDER TUMOR (TURBT);  Surgeon: Malka So, MD;  Location: [facility];  Service: Urology;  Laterality: N/A;  .  Cystoscopy  10/07/2011    Procedure: CYSTOSCOPY;  Surgeon: Malka So, MD;  Location: [facility];  Service: Urology;  Laterality: N/A;  . Cystoscopy with biopsy  03/14/2012    Procedure: CYSTOSCOPY WITH BIOPSY;  Surgeon: Malka So, MD;  Location: [facility];  Service: Urology;  Laterality: N/A;  WITH FULGURATION  . Transurethral resection of prostate  03/14/2012    Procedure: TRANSURETHRAL RESECTION OF THE PROSTATE WITH GYRUS INSTRUMENTS;  Surgeon: Malka So, MD;  Location: [facility];  Service: Urology;  Laterality: N/A;  . Tonsillectomy    . Cataract extraction w/ intraocular lens  implant, bilateral Bilateral   . Cardiac catheterization  05-11-2004  DR [place] PLAQUE/ NORMAL LVF/ EF 55%/  NON-OBSTRUCTIVE LAD 25%  . Cardiac catheterization  06/2012  . Eye surgery    . Amputation Left 10/16/2014    Procedure: AMPUTATION ABOVE KNEE- LEFT;  Surgeon: Mal Misty, MD;  Location: [place] [facility];  Service: Vascular;  Laterality: Left;    Current Outpatient Prescriptions on File Prior to Visit  Medication Sig Dispense Refill  . alum & mag hydroxide-simeth (MAALOX/MYLANTA) 200-200-20 MG/5ML suspension Take 30 mLs by mouth every 6 (six) hours as needed for indigestion or heartburn (dyspepsia). 355 mL 0  . bisacodyl (DULCOLAX) 10 MG suppository Place 1 suppository (10 mg total) rectally daily as needed for moderate constipation. 12 suppository 0  . donepezil (ARICEPT) 10 MG tablet TAKE ONE TABLET AT BEDTIME 30 tablet 2  . feeding supplement, ENSURE ENLIVE, (ENSURE ENLIVE) LIQD Take 237 mLs by mouth 2 (two) times daily between meals. 237 mL 12  . ferrous sulfate (FERROUSUL) 325 (65 FE) MG tablet Take 1 tablet (325 mg total) by mouth daily with breakfast.  3  . fluticasone (FLONASE) 50 MCG/ACT nasal spray Place 2 sprays into both nostrils daily. 16 g 6  . folic acid (FOLVITE) 1 MG tablet Take 1 tablet (1 mg total) by mouth daily.    . furosemide (LASIX) 20 MG tablet Take 1  tablet (20 mg total) by mouth daily.    Marland Kitchen gabapentin (NEURONTIN) 100 MG capsule TAKE (2) CAPSULES TWICE DAILY. 120 capsule 0  . hydrocerin (EUCERIN) CREA Apply 1 application topically 2 (two) times daily.  0  . isosorbide mononitrate (IMDUR) 30 MG 24 hr tablet Take 0.5 tablets (15 mg total) by mouth daily. 30 tablet 5  . metoprolol succinate (TOPROL-XL) 50 MG 24 hr tablet Take 1 tablet (50 mg total) by mouth daily. Take with or immediately following a meal.    . nitroGLYCERIN (NITROSTAT) 0.4 MG SL tablet Place 1 tablet (0.4 mg total) under the tongue every 5 (five) minutes as needed for chest pain (up to 3 doses).  25 tablet 3  . oxyCODONE (OXY IR/ROXICODONE) 5 MG immediate release tablet Take 0.5 tablets (2.5 mg total) by mouth every 6 (six) hours as needed for moderate pain. 5 tablet 0  . pantoprazole (PROTONIX) 40 MG tablet Take 1 tablet (40 mg total) by mouth daily.    . simvastatin (ZOCOR) 10 MG tablet TAKE 1 TABLET DAILY 30 tablet 4  . tamsulosin (FLOMAX) 0.4 MG CAPS capsule TAKE (1) CAPSULE DAILY 30 capsule 5  . thiamine 100 MG tablet Take 1 tablet (100 mg total) by mouth daily.    . traZODone (DESYREL) 50 MG tablet Take 0.5-1 tablets (25-50 mg total) by mouth at bedtime as needed for sleep.    Marland Kitchen ULORIC 40 MG tablet TAKE 1 TABLET DAILY 30 tablet 4  . warfarin (COUMADIN) 5 MG tablet Take 1 tablet (5 mg total) by mouth daily at 6 PM.      Social; the patient lives in [place] with his wife. His wife actually has chronic disability secondary to paralysis that happened in 2012. The patient's exact functional status prior to this is not completely clear   reports that he quit smoking about 39 years ago. His smoking use included Cigarettes. He started smoking about 72 years ago. He has a 20 pack-year smoking history. He has never used smokeless tobacco. He reports that he does not drink alcohol or use illicit drugs.  family history includes Arthritis in his mother; Deep vein thrombosis in his  father; Early death in his brother; Hypertension in his mother; Lung cancer in his sister.  Review of systems Neuro is doing a bit better eating better appears to be stronger HEENT; no oral pain or swallowing difficulties Respiratory; no cough or sputum Cardiac no chest pain GI no abdominal pain no diarrhea . GU no dysuria; I note that he is on Flomax. Skin; "sheer" injury to his right buttock Psychiatry;  Concerns for depression however he is more interactive today eating better this appears to be improving. Neurologic; no focal weakness is noted-no headache or dizziness complaints  Physical examination T- 98.7 pulse 64 respirations 20 blood pressure 151/65-124 were 58 in this range weight is stabilized at 146.2 Gen. A appears comfortable alert interactive makes eye contact  r. HEENT; visual acuity appears grossly intact-oropharynx clear mucous membranes moist Respiratory clear entry bilaterally no wheezing Cardiac heart sounds are normal no gallops no increase in jugular venous pressue- mild lower extremity edema on the right Abdomen; mildly distended no liver no spleen no masses and no tendernessthere are positive bowel sounds  Musculoskeletal; no evidence of acute gout or acute arthritis He is status post left AKA Staples are in place I do not see sign of drainage infection Vascular; in the right leg there is a strong femoral pulse no popliteal pulse no pulses palpable at the dorsalis pedis or posterior tibial. However his forefoot is warm. Neurologic;   Mental status; Appears more interactive talking then l when he was assessed by Dr. Lorre Nick.  11/11/2014.  Sodium 139 potassium 3.8 BUN 18 creatinine 0.87.  Albumin 3.0 which is rising ALT 30 otherwise liver function tests within normal limits.  WBC 3.5 hemoglobin 9.1 platelets 176.  INR 2.11.    Impression/plan #1 status post very proximal left above-knee amputation for severe peripheral vascular disease and  gangrene of his left foot his surgical incision looks stable Staples are still in--as noted above family is asking about stump shrinker apparently this cannot be done until staples are out  per discussion with physical therapy and they will discuss this with his son. #2 significant PAD is likely in the remaining right leg.  . There is nothing currently threatening in his right foot. #3 history of dementia on Aricept. He is now more interactive eating better Dr. Dellia Nims has started him on an antidepressive--there were concerns for failure to thrive but this appears to be turning around ;.   #4 history of DVT PE on chronic CoumadinINR is therapeutic update INR is pending for later this week  #5-history of anemia this is trending up with a hemoglobin of 9.1 had been as low as 7.7 on July 21-he is on iron update CBC is pending   [id number]      .

## 2014-11-14 LAB — BASIC METABOLIC PANEL
Anion gap: 6 (ref 5–15)
BUN: 18 mg/dL (ref 6–20)
CO2: 26 mmol/L (ref 22–32)
Calcium: 9 mg/dL (ref 8.9–10.3)
Chloride: 108 mmol/L (ref 101–111)
Creatinine, Ser: 0.84 mg/dL (ref 0.61–1.24)
GFR calc Af Amer: >60 mL/min (ref >60)
GFR calc non Af Amer: >60 mL/min (ref >60)
Glucose, Bld: 78 mg/dL (ref 65–99)
Potassium: 3.8 mmol/L (ref 3.5–5.1)
Sodium: 140 mmol/L (ref 135–145)

## 2014-11-14 LAB — PROTIME-INR
INR: 2.59 — ABNORMAL HIGH (ref 0.00–1.49)
Prothrombin Time: 27.4 seconds — ABNORMAL HIGH (ref 11.6–15.2)

## 2014-11-14 LAB — CBC WITH DIFFERENTIAL/PLATELET
Basophils Absolute: 0 10*3/uL (ref 0.0–0.1)
Basophils Relative: 0 % (ref 0–1)
Eosinophils Absolute: 0.2 10*3/uL (ref 0.0–0.7)
Eosinophils Relative: 5 % (ref 0–5)
HCT: 29.3 % — ABNORMAL LOW (ref 39.0–52.0)
Hemoglobin: 9.2 g/dL — ABNORMAL LOW (ref 13.0–17.0)
Lymphocytes Relative: 37 % (ref 12–46)
Lymphs Abs: 1.2 10*3/uL (ref 0.7–4.0)
MCH: 27.4 pg (ref 26.0–34.0)
MCHC: 31.4 g/dL (ref 30.0–36.0)
MCV: 87.2 fL (ref 78.0–100.0)
Monocytes Absolute: 0.3 10*3/uL (ref 0.1–1.0)
Monocytes Relative: 10 % (ref 3–12)
Neutro Abs: 1.6 10*3/uL — ABNORMAL LOW (ref 1.7–7.7)
Neutrophils Relative %: 48 % (ref 43–77)
Platelets: 188 10*3/uL (ref 150–400)
RBC: 3.36 MIL/uL — ABNORMAL LOW (ref 4.22–5.81)
RDW: 17 % — ABNORMAL HIGH (ref 11.5–15.5)
WBC: 3.4 10*3/uL — ABNORMAL LOW (ref 4.0–10.5)

## 2014-11-16 LAB — PROTIME-INR
INR: 2.96 — ABNORMAL HIGH (ref 0.00–1.49)
Prothrombin Time: 30.3 seconds — ABNORMAL HIGH (ref 11.6–15.2)

## 2014-11-18 ENCOUNTER — Encounter: Payer: Self-pay | Admitting: Family Medicine

## 2014-11-18 ENCOUNTER — Encounter: Payer: Self-pay | Admitting: Vascular Surgery

## 2014-11-18 LAB — PROTIME-INR
INR: 2.59 — ABNORMAL HIGH (ref 0.00–1.49)
Prothrombin Time: 27.4 seconds — ABNORMAL HIGH (ref 11.6–15.2)

## 2014-11-19 ENCOUNTER — Ambulatory Visit (INDEPENDENT_AMBULATORY_CARE_PROVIDER_SITE_OTHER): Payer: Self-pay | Admitting: Vascular Surgery

## 2014-11-19 ENCOUNTER — Encounter: Payer: Self-pay | Admitting: Vascular Surgery

## 2014-11-19 VITALS — BP 128/67 | HR 64 | Temp 97.8°F | Resp 18 | Ht 67.0 in | Wt 150.0 lb

## 2014-11-19 DIAGNOSIS — I739 Peripheral vascular disease, unspecified: Secondary | ICD-10-CM

## 2014-11-19 NOTE — Progress Notes (Signed)
Subjective:     Patient ID: Scott [name], male   DOB: 09/24/1921, 81 y.o.   MRN: [id number]  HPI This 81 year old male returns 4 weeks post left above-knee  Amputation for severe ischemia of the left foot. He states that his pain has been relieved. He does have occasional phantom discomfort but not to the  Degree that he was experiencing previously. He will be discharged from the nursing facility in the next 10 days according to his son.  His son questioned the possibility of a prosthesis.   Review of Systems     Objective:   Physical Exam BP 128/67 mmHg  Pulse 64  Temp(Src) 97.8 F (36.6 C)  Resp 18  Ht 5\' 7"  (1.702 m)  Wt 150 lb (68.04 kg)  BMI 23.49 kg/m2  SpO2 97%   elderly male in no apparent distress alert and oriented 3 Lungs no rhonchi or wheezing Left AKA well-healed. Skin staples were removed. No evidence of infection. No tenderness to palpation.     Assessment:      doing well 4 weeks post left AKA for severe ischemia left foot  discussed with son the fact that I did not think he was a candidate for prosthesis at his age and general energy level   also explained that we did not make that decision but that would be determined by rehabilitation medicine physician  whodeals  with prostheses.    Plan:      will return to see me on when necessary basis

## 2014-11-20 ENCOUNTER — Encounter (HOSPITAL_COMMUNITY)
Admission: AD | Admit: 2014-11-20 | Discharge: 2014-11-20 | Disposition: A | Discharge status: Home / Self Care | Payer: Medicare Other | Source: Skilled Nursing Facility | Attending: Internal Medicine | Admitting: Internal Medicine

## 2014-11-20 LAB — PROTIME-INR
INR: 2.39 — ABNORMAL HIGH (ref 0.00–1.49)
Prothrombin Time: 25.8 seconds — ABNORMAL HIGH (ref 11.6–15.2)

## 2014-11-22 LAB — PROTIME-INR
INR: 2.71 — ABNORMAL HIGH (ref 0.00–1.49)
Prothrombin Time: 28.4 s — ABNORMAL HIGH (ref 11.6–15.2)

## 2014-11-25 ENCOUNTER — Telehealth: Payer: Self-pay | Admitting: Family Medicine

## 2014-11-25 ENCOUNTER — Encounter (HOSPITAL_COMMUNITY)
Admission: RE | Admit: 2014-11-25 | Discharge: 2014-11-25 | Disposition: A | Discharge status: Home / Self Care | Payer: Medicare Other | Source: Skilled Nursing Facility | Attending: Internal Medicine | Admitting: Internal Medicine

## 2014-11-25 LAB — PROTIME-INR
INR: 2.35 — ABNORMAL HIGH (ref 0.00–1.49)
Prothrombin Time: 25.5 seconds — ABNORMAL HIGH (ref 11.6–15.2)

## 2014-11-25 NOTE — Telephone Encounter (Signed)
Pt son came in to check on paper. We did receive and dwm has on desk/lc

## 2014-11-26 ENCOUNTER — Telehealth: Payer: Self-pay | Admitting: Family Medicine

## 2014-11-26 NOTE — Telephone Encounter (Signed)
Faxed office note to [facility] to [phone]/lc

## 2014-11-28 LAB — PROTIME-INR
INR: 2.46 — ABNORMAL HIGH (ref 0.00–1.49)
Prothrombin Time: 26.4 seconds — ABNORMAL HIGH (ref 11.6–15.2)

## 2014-11-29 ENCOUNTER — Ambulatory Visit (HOSPITAL_COMMUNITY)
Admission: RE | Admit: 2014-11-29 | Discharge: 2014-11-29 | Disposition: A | Discharge status: Home / Self Care | Payer: No Typology Code available for payment source | Source: Ambulatory Visit | Attending: Internal Medicine | Admitting: Internal Medicine

## 2014-11-29 DIAGNOSIS — I743 Embolism and thrombosis of arteries of the lower extremities: Secondary | ICD-10-CM | POA: Diagnosis not present

## 2014-11-29 DIAGNOSIS — Z86718 Personal history of other venous thrombosis and embolism: Secondary | ICD-10-CM | POA: Diagnosis not present

## 2014-11-29 DIAGNOSIS — I82411 Acute embolism and thrombosis of right femoral vein: Secondary | ICD-10-CM | POA: Diagnosis not present

## 2014-12-02 ENCOUNTER — Encounter (HOSPITAL_COMMUNITY)
Admission: RE | Admit: 2014-12-02 | Discharge: 2014-12-02 | Disposition: A | Discharge status: Home / Self Care | Payer: Medicare Other | Source: Skilled Nursing Facility | Attending: Internal Medicine | Admitting: Internal Medicine

## 2014-12-02 LAB — PROTIME-INR
INR: 2.09 — ABNORMAL HIGH (ref 0.00–1.49)
Prothrombin Time: 23.3 seconds — ABNORMAL HIGH (ref 11.6–15.2)

## 2014-12-03 ENCOUNTER — Other Ambulatory Visit: Payer: Self-pay | Admitting: *Deleted

## 2014-12-03 MED ORDER — OXYCODONE HCL 5 MG PO TABS: 2.5000 mg | ORAL_TABLET | Freq: Four times a day (QID) | ORAL | Status: DC | PRN

## 2014-12-03 NOTE — Telephone Encounter (Signed)
[facility] 

## 2014-12-04 ENCOUNTER — Non-Acute Institutional Stay (SKILLED_NURSING_FACILITY): Payer: Medicare Other | Admitting: Internal Medicine

## 2014-12-04 DIAGNOSIS — Z89612 Acquired absence of left leg above knee: Secondary | ICD-10-CM | POA: Diagnosis not present

## 2014-12-09 ENCOUNTER — Encounter (HOSPITAL_COMMUNITY)
Admission: RE | Admit: 2014-12-09 | Discharge: 2014-12-09 | Disposition: A | Discharge status: Home / Self Care | Payer: Medicare Other | Source: Skilled Nursing Facility | Attending: Internal Medicine | Admitting: Internal Medicine

## 2014-12-09 LAB — PROTIME-INR
INR: 2.1 — ABNORMAL HIGH (ref 0.00–1.49)
Prothrombin Time: 23.4 seconds — ABNORMAL HIGH (ref 11.6–15.2)

## 2014-12-10 ENCOUNTER — Telehealth: Payer: Self-pay

## 2014-12-10 ENCOUNTER — Telehealth: Payer: Self-pay | Admitting: Family Medicine

## 2014-12-10 NOTE — Telephone Encounter (Signed)
Hand written Rx for PT signed and being faxed by Erle Crocker to below fax #

## 2014-12-10 NOTE — Telephone Encounter (Signed)
This is being handled in another telephone encounter. Closing this encounter

## 2014-12-10 NOTE — Telephone Encounter (Signed)
Patient being discharged to Rehab  Need an order from primary care dr. For rehab that say PT to evaluate and treat   Fax to [phone]

## 2014-12-10 NOTE — Progress Notes (Addendum)
Patient ID: Scott [name], male   DOB: Jan 31, 1922, 81 y.o.   MRN: [id number]                PROGRESS NOTE  DATE:  12/04/2014         FACILITY: [facility]                       LEVEL OF CARE:   SNF   Acute Visit                    CHIEF COMPLAINT:  Review of left stump.    HISTORY OF PRESENT ILLNESS:  I was brought into a conversation today about the status of the patient's left stump, vis--vis prosthesis measurements, etc.  He apparently has not really been using his stump shrinker, but some form of elastic wrap.  We have already placed the patient in contact with the orthotic provider.       PHYSICAL EXAMINATION:   MUSCULOSKELETAL:   EXTREMITIES:   LEFT LOWER EXTREMITY:  Left stump:  He has had a very proximal left AKA on 10/16/2014.  The stump incision is well healed.  There is nothing worrisome here.  He does have some edema around the actual incision, which is 2+.  I am not sure why he does not have a more aggressive stump shrinker at this point.    ASSESSMENT/PLAN:                     Status post a left above-the-knee amputation.  The incision is well healed.  I think he needs a stump shrinker and/or elastic stockings.  Beyond that, I think he would be a candidate for a prosthesis.  His wound is well healed.  Using an above knee prosthesis is not an easy thing to do however.     CPT CODE: 31540

## 2014-12-10 NOTE — Telephone Encounter (Signed)
This patient may have a PT order to evaluate and treat

## 2014-12-11 ENCOUNTER — Non-Acute Institutional Stay (SKILLED_NURSING_FACILITY): Payer: Medicare Other | Admitting: Internal Medicine

## 2014-12-11 DIAGNOSIS — I255 Ischemic cardiomyopathy: Secondary | ICD-10-CM | POA: Diagnosis not present

## 2014-12-11 DIAGNOSIS — I482 Chronic atrial fibrillation, unspecified: Secondary | ICD-10-CM

## 2014-12-11 DIAGNOSIS — Z89612 Acquired absence of left leg above knee: Secondary | ICD-10-CM

## 2014-12-17 ENCOUNTER — Encounter: Payer: Self-pay | Admitting: Physical Therapy

## 2014-12-17 ENCOUNTER — Ambulatory Visit: Payer: Medicare Other | Attending: Family Medicine | Admitting: Physical Therapy

## 2014-12-17 DIAGNOSIS — Z89612 Acquired absence of left leg above knee: Secondary | ICD-10-CM

## 2014-12-17 DIAGNOSIS — R6889 Other general symptoms and signs: Secondary | ICD-10-CM

## 2014-12-17 DIAGNOSIS — Z4789 Encounter for other orthopedic aftercare: Secondary | ICD-10-CM

## 2014-12-17 DIAGNOSIS — R2689 Other abnormalities of gait and mobility: Secondary | ICD-10-CM

## 2014-12-17 DIAGNOSIS — R29818 Other symptoms and signs involving the nervous system: Secondary | ICD-10-CM | POA: Diagnosis not present

## 2014-12-17 DIAGNOSIS — R2681 Unsteadiness on feet: Secondary | ICD-10-CM

## 2014-12-17 DIAGNOSIS — R531 Weakness: Secondary | ICD-10-CM | POA: Diagnosis not present

## 2014-12-17 DIAGNOSIS — R269 Unspecified abnormalities of gait and mobility: Secondary | ICD-10-CM | POA: Diagnosis not present

## 2014-12-17 DIAGNOSIS — Z5189 Encounter for other specified aftercare: Secondary | ICD-10-CM | POA: Insufficient documentation

## 2014-12-17 NOTE — Progress Notes (Signed)
Patient ID: Scott [name], male   DOB: 1922-03-01, 81 y.o.   MRN: [id number]                PROGRESS NOTE  DATE:  12/11/2014       FACILITY: [facility]              LEVEL OF CARE:   SNF   Acute Visit/Discharge Visit   CHIEF COMPLAINT:  Pre-discharge review.      HISTORY OF PRESENT ILLNESS:  This is a patient who came into the facility after undergoing a very proximal left above-knee amputation.  He has previously been at [facility].  He had gangrene of his left foot that was not amenable to revascularization or medical treatment.    He has a history of paroxysmal atrial fibrillation, non-ischemic cardiomyopathy, and hypertension.     The patient has actually had his stump casted.  The incision itself healed very well with some edema, but they have been able to form an initial mold of this.  He actually has taken apparently 10 steps with a walker.    CURRENT MEDICATIONS:  Medication list is reviewed.         Imdur 30 mg,  tablet/15 mg daily.    Nitrostat p.r.n.       Oxycodone 5 mg,  tablet/2.5 mg q.6.    Protonix 40 q.d.      Simvastatin 10 q.d.     Toprol XL 50 q.d.       Uloric 40 q.d.      Zoloft 50 q.d.      Coumadin 4.5 q.d., with a PT/INR check for two weeks from 12/09/2014.  I think his INR was quite stable at 2.10 on 12/09/2014.    REVIEW OF SYSTEMS:    CHEST/RESPIRATORY:  The patient is not short of breath.   CARDIAC:  No chest pain.   GI:  No nausea, vomiting, or abdominal pain.   .    GU:  No dysuria.     PHYSICAL EXAMINATION:   VITAL SIGNS:     TEMPERATURE:  97.5.     PULSE:  16 per minute.     RESPIRATIONS:  20.    BLOOD PRESSURE:  Blood pressure has been intermittently elevated in the 160s and 170s.   CHEST/RESPIRATORY:  Clear air entry bilaterally.    CARDIOVASCULAR:   CARDIAC:  Atrial fibrillation.  No S3.  JVP is not elevated.    EDEMA/VARICOSITIES:     He has no edema.  No coccyx edema.     ASSESSMENT/PLAN:                   Status post left above-knee amputation.  As noted, the initial mold for his prosthesis has already been casted.  For some reason, these orders seem to be coming to Primary Care these days and I see that his primary care provider has already been notified at [facility].     Atrial fibrillation.  His heart rate is controlled.   He is anticoagulated on Coumadin.    Hypertension.  This is borderline in the 160 range.  He does not have diastolic hypertension.  I am going to change the Imdur to 30 mg once a day.  He may need additional attention to this.    With regards to his own discharge plans, he is apparently going with either an outpatient therapist or a therapy company coming into the home.  He  will go home with a rolling walker.  He already has a wheelchair at home.       CPT CODE: 73578

## 2014-12-18 ENCOUNTER — Ambulatory Visit (INDEPENDENT_AMBULATORY_CARE_PROVIDER_SITE_OTHER): Payer: Medicare Other | Admitting: Family Medicine

## 2014-12-18 ENCOUNTER — Encounter: Payer: Self-pay | Admitting: Family Medicine

## 2014-12-18 ENCOUNTER — Inpatient Hospital Stay: Payer: Medicare Other | Admitting: Physical Medicine & Rehabilitation

## 2014-12-18 VITALS — BP 168/72 | HR 56 | Temp 97.9°F | Ht 67.0 in | Wt 150.0 lb

## 2014-12-18 DIAGNOSIS — Z89612 Acquired absence of left leg above knee: Secondary | ICD-10-CM | POA: Diagnosis not present

## 2014-12-18 DIAGNOSIS — I739 Peripheral vascular disease, unspecified: Secondary | ICD-10-CM | POA: Diagnosis not present

## 2014-12-18 DIAGNOSIS — I2699 Other pulmonary embolism without acute cor pulmonale: Secondary | ICD-10-CM

## 2014-12-18 DIAGNOSIS — Z86711 Personal history of pulmonary embolism: Secondary | ICD-10-CM | POA: Diagnosis not present

## 2014-12-18 DIAGNOSIS — I82403 Acute embolism and thrombosis of unspecified deep veins of lower extremity, bilateral: Secondary | ICD-10-CM | POA: Diagnosis not present

## 2014-12-18 DIAGNOSIS — R32 Unspecified urinary incontinence: Secondary | ICD-10-CM

## 2014-12-18 DIAGNOSIS — R748 Abnormal levels of other serum enzymes: Secondary | ICD-10-CM

## 2014-12-18 DIAGNOSIS — R7989 Other specified abnormal findings of blood chemistry: Secondary | ICD-10-CM

## 2014-12-18 DIAGNOSIS — I70269 Atherosclerosis of native arteries of extremities with gangrene, unspecified extremity: Secondary | ICD-10-CM | POA: Diagnosis not present

## 2014-12-18 LAB — POCT INR: INR: 2.6

## 2014-12-18 NOTE — Therapy (Signed)
[facility] [address] [place], [place], [address] Phone: [phone]   Fax:  [phone]  Physical Therapy Evaluation  Patient Details  Name: Scott [name] MRN: [id number] Date of Birth: 1921-08-24 Referring Provider:  Chipper Herb, MD  Encounter Date: 12/17/2014      PT End of Session - 12/17/14 1445    Visit Number 1   Number of Visits 18   Date for PT Re-Evaluation 02/14/15   Authorization Type Medicare Do G-code every 10th visit   PT Start Time 1445   PT Stop Time 1530   PT Time Calculation (min) 45 min   Equipment Utilized During Treatment Gait belt   Activity Tolerance Patient tolerated treatment well   Behavior During Therapy [facility] for tasks assessed/performed      Past Medical History  Diagnosis Date  . HTN (hypertension)   . Hyperlipidemia   . PVCs (premature ventricular contractions)   . Glaucoma   . PVD (peripheral vascular disease)     Right ABI .75, Left .78 (2006)  . Popliteal artery aneurysm, bilateral DECUMENTED CHRONIC PARTIAL OCCLUSION--  PT DENIES CLAUDICATION OR ANY OTHER SYMPTOMS  . Frequency of urination   . Nocturia   . DVT, bilateral lower limbs 03/2013    a. Bilat DVT dx 03/2013.  . Pulmonary embolism     a. By CT angio 04/2013.  Marland Kitchen Elevated troponin     a. Adm 12/30-04/2013 - not felt to represent ACS; ?due to PE. b. Patent cors 06/2012.  Marland Kitchen NSVT (nonsustained ventricular tachycardia)     a. NSVT 06/2012; NSVT also seen during 03/2013 adm. b. Med rx. Not candidate for ICD given adv age.  Marland Kitchen ETOH abuse   . Syncope     a. Felt to be postural syncope related to diuretics 06/2012.  Marland Kitchen Heart murmur   . DVT (deep venous thrombosis)     "I think in both legs"  . Pneumonia     "couple times"  . DDD (degenerative disc disease)   . Arthritis     "all over"  . Depression   . CAD- non obstructive disease by cath 3/14   . PAD (peripheral artery disease)   . Gangrene of toe 10/03/2014  .  Sepsis due to other etiology 10/03/2014  . Chronic systolic heart failure 9/37/90    EF improved 50-55%  . Chronic systolic CHF (congestive heart failure)     a. NICM - patent cors 06/2012, EF 40% at that time. b. 03/2013 eval: EF 20-25%.  . Chronic lower back pain   . Bladder cancer 03/15/2012    pt denies this hx on 10/15/2014    Past Surgical History  Procedure Laterality Date  . Transthoracic echocardiogram  09-22-2010    MODERATE LVH/ EF 24%/ GRADE I DIASTOLIC DYSFUNCTION/ AORTIC SCLEROSIS WITHOUT STENOSIS/  RV  SYSTOLIC  MILDLY REDUCED FUNCTION  . Cardiovascular stress test  10-15-2010    LOW RISK NUCLEAR STUDY/ NO EVIDENCE OF ISCHEMIA/ NORMAL EF  . Shoulder surgery  1970's    "don't remember which side or what kind of OR"  . Transurethral resection of bladder tumor  10/07/2011    Procedure: TRANSURETHRAL RESECTION OF BLADDER TUMOR (TURBT);  Surgeon: Malka So, MD;  Location: [facility];  Service: Urology;  Laterality: N/A;  . Cystoscopy  10/07/2011    Procedure: CYSTOSCOPY;  Surgeon: Malka So, MD;  Location[facility];  Service: Urology;  Laterality: N/A;  .  Cystoscopy with biopsy  03/14/2012    Procedure: CYSTOSCOPY WITH BIOPSY;  Surgeon: Malka So, MD;  Location: [facility];  Service: Urology;  Laterality: N/A;  WITH FULGURATION  . Transurethral resection of prostate  03/14/2012    Procedure: TRANSURETHRAL RESECTION OF THE PROSTATE WITH GYRUS INSTRUMENTS;  Surgeon: Malka So, MD;  Location: [facility];  Service: Urology;  Laterality: N/A;  . Tonsillectomy    . Cataract extraction w/ intraocular lens  implant, bilateral Bilateral   . Cardiac catheterization  05-11-2004  DR De Smet PLAQUE/ NORMAL LVF/ EF 55%/  NON-OBSTRUCTIVE LAD 25%  . Cardiac catheterization  06/2012  . Eye surgery    . Amputation Left 10/16/2014    Procedure: AMPUTATION ABOVE KNEE- LEFT;  Surgeon: Mal Misty, MD;  Location: [facility];  Service: Vascular;   Laterality: Left;    There were no vitals filed for this visit.  Visit Diagnosis:  Functional weakness  Abnormality of gait  Decreased functional activity tolerance  Unsteadiness  Balance problems  Status post above knee amputation of left lower extremity  Encounter for prosthetic gait training      Subjective Assessment - 12/17/14 1458    Subjective This 81yo male underwent a left Transfemoral Amputation on 10/16/2014. He was in nursing home 40days with discharge 11/12/2014. He recieved his first prosthesis 11/09/2014 with dependency in use & care. He presents for PT evaluation.   Patient is accompained by: Family member   Patient Stated Goals He wants to use prosthesis to walk in home & community.    Currently in Pain? Yes   Pain Score 3    Pain Location Leg  residual limb   Pain Orientation Left   Pain Descriptors / Indicators Aching   Pain Type Phantom pain   Pain Onset More than a month ago   Pain Frequency Intermittent   Aggravating Factors  unknown   Pain Relieving Factors medication            [facility] PT Assessment - 12/17/14 1445    Assessment   Medical Diagnosis Left Transfemoral Amputation   Onset Date/Surgical Date 11/09/14  Prosthetic delivery, 11/12/14 change in living situation   Precautions   Precautions Fall   Restrictions   Weight Bearing Restrictions No   Balance Screen   Has the patient fallen in the past 6 months No   Has the patient had a decrease in activity level because of a fear of falling?  Yes   Is the patient reluctant to leave their home because of a fear of falling?  No   Home Environment   Living Environment Private residence   Living Arrangements Spouse/significant other  wife is paraplegia, 24hr care paid, son lives 12 miles away   Available Help at Discharge Available 24 hours/day  paid care mainly for wife but rates higher for his care   Type of [place] entrance   Home Layout One level   [place] - 2 wheels;Cane - quad;Tub bench;Grab bars - tub/shower;Hand held shower head;Wheelchair - manual   Prior Function   Level of Independence Independent;Independent with gait;Independent with transfers;Independent with household mobility without device;Independent with community mobility without device   Observation/Other Assessments   Focus on Therapeutic Outcomes (FOTO)  38  Functional Status   Fear Avoidance Belief Questionnaire (FABQ)  47%   Posture/Postural Control   Posture/Postural Control Postural limitations   Postural Limitations Rounded Shoulders;Forward head;Increased lumbar  lordosis;Flexed trunk;Weight shift right   ROM / Strength   AROM / PROM / Strength AROM;Strength   AROM   Overall AROM  Within functional limits for tasks performed   Overall AROM Comments gross testing in sitting: UEs & LEs WFL  hip flexors appear tight but not tested, trunk tightness als   Strength   Overall Strength Within functional limits for tasks performed   Overall Strength Comments LEs tested grossly in sitting with no deficits noted   Transfers   Transfers Sit to Stand;Stand to Sit   Sit to Stand 5: Supervision;From elevated surface;With upper extremity assist;With armrests;From chair/3-in-1  to RW for stabilization, from 22" w/c   Sit to Stand Details Verbal cues for sequencing;Verbal cues for technique;Verbal cues for safe use of DME/AE   Sit to Stand Details (indicate cue type and reason) verbal cues on prosthesis control esp. knee   Stand to Sit 5: Supervision;With upper extremity assist;With armrests;To chair/3-in-1  from RW for support   Stand to Sit Details (indicate cue type and reason) Verbal cues for technique;Verbal cues for precautions/safety;Verbal cues for safe use of DME/AE   Stand to Sit Details verbal cues on prosthesis control, unlocking prosthesis   Ambulation/Gait   Ambulation/Gait Yes   Ambulation/Gait Assistance 4: Min assist   Ambulation/Gait Assistance Details PT  demo, instructed proper technique with AKA prosthesis prior to gait   Ambulation Distance (Feet) 50 Feet   Assistive device Prosthesis;Rolling walker   Gait Pattern Step-to pattern;Decreased step length - right;Decreased stance time - left;Decreased stride length;Decreased hip/knee flexion - left;Decreased weight shift to left;Left hip hike;Left circumduction;Trunk flexed;Abducted - left;Poor foot clearance - left   Ambulation Surface Indoor;Level   Gait velocity 0.42 ft/sec   Balance   Balance Assessed Yes   Static Standing Balance   Static Standing - Balance Support Bilateral upper extremity supported;No upper extremity supported   Static Standing - Level of Assistance 5: Stand by assistance;4: Min assist  SBA with RW support, MinA without UE support   Static Standing - Comment/# of Minutes stands >2 min with RW, 60sec without UE support   Dynamic Standing Balance   Dynamic Standing - Balance Support Left upper extremity supported;No upper extremity supported   Dynamic Standing - Level of Assistance 5: Stand by assistance;4: Min assist  SBA with RW, MinA no UE support   Dynamic Standing - Balance Activities Reaching for objects;Head turns   Dynamic Standing - Comments reaches 6" with RW & 1" without UE support, looking behind tuns head & shoulders with minimal wt shift with RW support and only rotates head without UE support   Standardized Balance Assessment   Standardized Balance Assessment Berg Balance Test   Berg Balance Test   Sit to Stand Needs minimal aid to stand or to stabilize   Standing Unsupported Unable to stand 30 seconds unassisted   Sitting with Back Unsupported but Feet Supported on Floor or Stool Able to sit safely and securely 2 minutes   Stand to Sit Controls descent by using hands   Transfers Able to transfer safely, definite need of hands   Standing Unsupported with Eyes Closed Unable to keep eyes closed 3 seconds but stays steady   Standing Ubsupported with Feet  Together Needs help to attain position and unable to hold for 15 seconds   From Standing, Reach Forward with Outstretched Arm Loses balance while trying/requires external support   From Standing Position, Pick up Object from Floor Unable to try/needs assist to keep  balance   From Standing Position, Turn to Look Behind Over each Shoulder Needs assist to keep from losing balance and falling   Turn 360 Degrees Needs assistance while turning   Standing Unsupported, Alternately Place Feet on Step/Stool Needs assistance to keep from falling or unable to try   Standing Unsupported, One Foot in [place] balance while stepping or standing   Standing on One Leg Unable to try or needs assist to prevent fall   Total Score 12         Prosthetics Assessment - 12/17/14 1445    [place] with Skin check;Residual limb care;Prosthetic cleaning;Ply sock cleaning;Correct ply sock adjustment;Proper wear schedule/adjustment;Proper weight-bearing schedule/adjustment   Donning prosthesis  Min assist   Doffing prosthesis  Supervision   Current prosthetic wear tolerance (days/week)  tolerates daily wear since delivery 7 days ago   Current prosthetic wear tolerance (#hours/day)  1 hr /day   Edema none noted   Residual limb condition  59mm scab on inicision at midline, no signs of infection, normal color & temperature, dry skin,    K code/activity level with prosthetic use  2                  [facility] Adult PT Treatment/Exercise - 12/17/14 1445    Prosthetics   Education Provided Skin check;Residual limb care;Prosthetic cleaning;Ply sock cleaning;Correct ply sock adjustment;Proper Donning;Proper Doffing;Proper wear schedule/adjustment  wear prosthesis 2 hrs 2x/day, increase q5-7 days if no issue   Person(s) Educated Patient;Child(ren)   Education Method Explanation;Demonstration;Tactile cues;Verbal cues   Education Method Verbalized understanding;Tactile cues  required;Returned demonstration;Verbal cues required;Needs further instruction                  PT Short Term Goals - 12/17/14 1445    PT SHORT TERM GOAL #1   Title Patient donnes prosthesis correctly modified independent. (Target Date: 01/15/2015)   Time 1   Period Months   Status New   PT SHORT TERM GOAL #2   Title Patient tolerates wear >10hrs total per day without skin issues or limb pain.  (Target Date: 01/15/2015)   Time 1   Period Months   Status New   PT SHORT TERM GOAL #3   Title Patient performs standing balance with RW & prosthesis reaching 10", to floor and manages pants for toileting modified inependent.  (Target Date: 01/15/2015)   Time 1   Period Months   Status New   PT SHORT TERM GOAL #4   Title Patient ambulates 200' with RW & prosthesis with SBA.  (Target Date: 01/15/2015)   Time 1   Period Months   Status New   PT SHORT TERM GOAL #5   Title Patient negotiates stairs (2 rails), ramps & curbs with RW & prosthesis with Minimal assist.  (Target Date: 01/15/2015)   Time 1   Period Months   Status New           PT Long Term Goals - 12/17/14 1445    PT LONG TERM GOAL #1   Title Patient verbalizes proper prosthetic care.  (Target Date: 02/14/2015)   Time 2   Period Months   Status New   PT LONG TERM GOAL #2   Title Patient tolerates wear of prosthesis >80% of awake hours without skin issues or limb pain.  (Target Date: 02/14/2015)   Time 2   Period Months   Status New   PT LONG TERM GOAL #3   Title Merrilee Jansky  Balance >25/56  (Target Date: 02/14/2015)   Time 2   Period Months   Status New   PT LONG TERM GOAL #4   Title Patient ambulates 300' with LRAD & prosthesis modified independent to enable community mobility.  (Target Date: 02/14/2015)   Time 2   Period Months   Status New   PT LONG TERM GOAL #5   Title Patient negotiates stairs, ramps & curbs with LRAD & prosthesis modified independent to enable community mobility.  (Target Date: 02/14/2015)    Time 2   Period Months   Status New   Additional Long Term Goals   Additional Long Term Goals Yes   PT LONG TERM GOAL #6   Title Patient ambulates household around furniture with LRAD modified independent.  (Target Date: 02/14/2015)   Time 2   Period Months   Status New               Plan - 01-08-15 1445    Clinical Impression Statement This 81yo male was very active in home & community prior to issues with his leg issues including assisting care of disabled wife. He recieved his first prosthesis 12/10/2014, 1 week prior to PT evaluation. He was in a nursing home at that time and was discharged 12/13/2014, which was 3 days after receiving his prosthesis. He is dependent in use & care. He wears prosthesis only 1 hr per day which amount of awake hours that he can be functional. Balance testing indicates fall risk but potential to perform standing ADLs without UE support. His gait is dependent, unknowledgeable in prosthesis use and limited in distance & any environmental barriers.                                               Pt will benefit from skilled therapeutic intervention in order to improve on the following deficits Abnormal gait;Decreased activity tolerance;Decreased balance;Decreased endurance;Decreased knowledge of use of DME;Decreased mobility;Decreased range of motion;Decreased skin integrity;Decreased strength;Postural dysfunction;Prosthetic Dependency   Rehab Potential Good   PT Frequency 2x / week   PT Duration Other (comment)  9 weeks (60 days)   PT Treatment/Interventions ADLs/Self Care Home Management;DME Instruction;Gait training;Stair training;Functional mobility training;Therapeutic activities;Therapeutic exercise;Balance training;Neuromuscular re-education;Patient/family education;Prosthetic Training;Manual techniques   PT Next Visit Plan review prosthetic care, gait with RW, add barriers as able   PT Home Exercise Plan Mid-line at sink   Consulted and Agree with Plan of  Care Patient;Family member/caregiver   Family Member Consulted son, Cheri Guppy - 2015-01-08 1445    Functional Assessment Tool Used Patient is dependent in prosthetic care. He tolerates wear daily for only 1 hr. Moderate assist to donne prosthesis.   Functional Limitation Mobility: Walking and moving around   Mobility: Walking and Moving Around Current Status [phone]) At least 80 percent but less than 100 percent impaired, limited or restricted   Mobility: Walking and Moving Around Goal Status [phone]) At least 1 percent but less than 20 percent impaired, limited or restricted       Problem List Patient Active Problem List   Diagnosis Date Noted  . Status post above knee amputation of left lower extremity 10/18/2014  . Tachyarrhythmia 10/05/2014  . Aspiration pneumonia 10/05/2014  . UTI (lower urinary tract infection) 10/03/2014  . Sepsis due to other etiology 10/03/2014  .  Gangrene of toe 10/03/2014  . Ischemic ulcer of left foot 10/03/2014  . Atherosclerotic PVD with ulceration 10/03/2014  . Warfarin-induced coagulopathy 10/03/2014  . Atrial fibrillation with RVR 10/03/2014  . Acute kidney injury 10/03/2014  . Hyperkalemia 10/03/2014  . Cellulitis of left leg 10/03/2014  . PAD (peripheral artery disease) 09/24/2014  . NSTEMI (non-ST elevated myocardial infarction) 10/09/2013  . High risk medication use 05/17/2013  . BPH (benign prostatic hyperplasia) 05/17/2013  . Vitamin D deficiency 05/17/2013  . History of pulmonary embolism (2014) 04/17/2013  . NSVT (nonsustained ventricular tachycardia)- not an ICD candidate 04/17/2013  . NICM (nonischemic cardiomyopathy)- EF 20-25% by Echo 04/12/13 04/17/2013  . Chronic diastolic heart failure, NYHA class 1 03/15/2013  . DVT of lower extremity, bilateral 03/15/2013  . Fatigue 11/02/2012  . Constipation 11/02/2012  . Alcohol abuse 08/27/2012  . Syncope 06/21/2012  . Bladder cancer 03/15/2012  . Benign localized hyperplasia  of prostate with urinary obstruction 03/15/2012  . Benign essential HTN   . Hyperlipidemia   . PVCs (premature ventricular contractions)   . CAD- non obstructive disease by cath 3/14     Sharolyn Weber PT, DPT 12/18/2014, 4:24 PM  [facility] [address] [place], [place], [address] Phone: [phone]   Fax:  [phone]

## 2014-12-18 NOTE — Progress Notes (Signed)
Subjective:    Patient ID: Scott [name], male    DOB: 1921-09-28, 81 y.o.   MRN: [id number]  HPI Patient here today for follow up from [facility], where he was at for Rehabilitation from left lower leg amputation. He is accompanied today by his son, [name]. This patient returns to the office today following being admitted to the hospital for an above the knee amputation on the left secondary to peripheral artery disease and gangrene which assisted in on a fourth and fifth toe area. He is subsequently treated in the hospital and is at the [facility] in [place]. He came home from the nursing center yesterday.      Patient Active Problem List   Diagnosis Date Noted  . Status post above knee amputation of left lower extremity 10/18/2014  . Tachyarrhythmia 10/05/2014  . Aspiration pneumonia 10/05/2014  . UTI (lower urinary tract infection) 10/03/2014  . Sepsis due to other etiology 10/03/2014  . Gangrene of toe 10/03/2014  . Ischemic ulcer of left foot 10/03/2014  . Atherosclerotic PVD with ulceration 10/03/2014  . Warfarin-induced coagulopathy 10/03/2014  . Atrial fibrillation with RVR 10/03/2014  . Acute kidney injury 10/03/2014  . Hyperkalemia 10/03/2014  . Cellulitis of left leg 10/03/2014  . PAD (peripheral artery disease) 09/24/2014  . NSTEMI (non-ST elevated myocardial infarction) 10/09/2013  . High risk medication use 05/17/2013  . BPH (benign prostatic hyperplasia) 05/17/2013  . Vitamin D deficiency 05/17/2013  . History of pulmonary embolism (2014) 04/17/2013  . NSVT (nonsustained ventricular tachycardia)- not an ICD candidate 04/17/2013  . NICM (nonischemic cardiomyopathy)- EF 20-25% by Echo 04/12/13 04/17/2013  . Chronic diastolic heart failure, NYHA class 1 03/15/2013  . DVT of lower extremity, bilateral 03/15/2013  . Fatigue 11/02/2012  . Constipation 11/02/2012  . Alcohol abuse 08/27/2012  . Syncope 06/21/2012  . Bladder cancer 03/15/2012    . Benign localized hyperplasia of prostate with urinary obstruction 03/15/2012  . Benign essential HTN   . Hyperlipidemia   . PVCs (premature ventricular contractions)   . CAD- non obstructive disease by cath 3/14    Outpatient Encounter Prescriptions as of 12/18/2014  Medication Sig  . alum & mag hydroxide-simeth (MAALOX/MYLANTA) 200-200-20 MG/5ML suspension Take 30 mLs by mouth every 6 (six) hours as needed for indigestion or heartburn (dyspepsia).  . bisacodyl (DULCOLAX) 10 MG suppository Place 1 suppository (10 mg total) rectally daily as needed for moderate constipation.  Marland Kitchen donepezil (ARICEPT) 10 MG tablet TAKE ONE TABLET AT BEDTIME  . feeding supplement, ENSURE ENLIVE, (ENSURE ENLIVE) LIQD Take 237 mLs by mouth 2 (two) times daily between meals.  . ferrous sulfate (FERROUSUL) 325 (65 FE) MG tablet Take 1 tablet (325 mg total) by mouth daily with breakfast.  . fluticasone (FLONASE) 50 MCG/ACT nasal spray Place 2 sprays into both nostrils daily.  . folic acid (FOLVITE) 1 MG tablet Take 1 tablet (1 mg total) by mouth daily.  . furosemide (LASIX) 20 MG tablet Take 1 tablet (20 mg total) by mouth daily.  Marland Kitchen gabapentin (NEURONTIN) 100 MG capsule TAKE (2) CAPSULES TWICE DAILY.  . hydrocerin (EUCERIN) CREA Apply 1 application topically 2 (two) times daily.  . isosorbide mononitrate (IMDUR) 30 MG 24 hr tablet Take 0.5 tablets (15 mg total) by mouth daily.  . metoprolol succinate (TOPROL-XL) 50 MG 24 hr tablet Take 1 tablet (50 mg total) by mouth daily. Take with or immediately following a meal.  . nitroGLYCERIN (NITROSTAT) 0.4 MG SL tablet Place  1 tablet (0.4 mg total) under the tongue every 5 (five) minutes as needed for chest pain (up to 3 doses).  Marland Kitchen oxyCODONE (OXY IR/ROXICODONE) 5 MG immediate release tablet Take 0.5 tablets (2.5 mg total) by mouth every 6 (six) hours as needed for moderate pain.  . pantoprazole (PROTONIX) 40 MG tablet Take 1 tablet (40 mg total) by mouth daily.  . simvastatin  (ZOCOR) 10 MG tablet TAKE 1 TABLET DAILY  . tamsulosin (FLOMAX) 0.4 MG CAPS capsule TAKE (1) CAPSULE DAILY  . thiamine 100 MG tablet Take 1 tablet (100 mg total) by mouth daily.  . traZODone (DESYREL) 50 MG tablet Take 0.5-1 tablets (25-50 mg total) by mouth at bedtime as needed for sleep.  Marland Kitchen ULORIC 40 MG tablet TAKE 1 TABLET DAILY  . warfarin (COUMADIN) 5 MG tablet Take 1 tablet (5 mg total) by mouth daily at 6 PM.   No facility-administered encounter medications on file as of 12/18/2014.      Review of Systems  Constitutional: Negative.   HENT: Negative.   Eyes: Negative.   Respiratory: Negative.   Cardiovascular: Negative.   Gastrointestinal: Negative.   Endocrine: Negative.   Genitourinary: Negative.   Musculoskeletal: Negative.   Skin: Negative.   Allergic/Immunologic: Negative.   Neurological: Negative.   Hematological: Negative.   Psychiatric/Behavioral: Negative.        Objective:   Physical Exam  Constitutional: He is oriented to person, place, and time. No distress.  HENT:  Head: Normocephalic.  Eyes: Conjunctivae and EOM are normal. Pupils are equal, round, and reactive to light. Right eye exhibits no discharge. Left eye exhibits no discharge.  Neck: Normal range of motion. Neck supple. No thyromegaly present.  Cardiovascular: Normal rate and normal heart sounds.   No murmur heard. The heart was slightly irregular at 72/m  Pulmonary/Chest: Effort normal and breath sounds normal. He has no wheezes. He has no rales.  Abdominal: Soft. He exhibits no distension and no mass. There is no tenderness. There is no guarding.  Musculoskeletal: He exhibits no edema or tenderness.  The patient has had a recent above-the-knee amputation and he was able to put his prosthesis on and walk down the hallway during the visit today. He still working with rehabilitation and refining his therapy. The stump was healing well. There is no edema in the opposite leg.  Neurological: He is  alert and oriented to person, place, and time.  The patient has a positive attitude and is quite amazing.  Skin: Skin is warm and dry. No rash noted. No erythema.  Psychiatric: He has a normal mood and affect. His behavior is normal. Judgment and thought content normal.  Nursing note and vitals reviewed.  BP 168/72 mmHg  Pulse 56  Temp(Src) 97.9 F (36.6 C) (Oral)  Ht 5' 7" (1.702 m)  Wt 150 lb (68.04 kg)  BMI 23.49 kg/m2        Assessment & Plan:  1. DVT of lower extremity, bilateral -His pro time today was 2.5 and he will continue with his current Coumadin treatment - POCT INR - CBC with Differential/Platelet - CMP14+EGFR  2. Acute pulmonary embolism -No more pulmonary emboli he will continue with his Coumadin as doing  3. Peripheral vascular insufficiency -The patient has subsequently had an above-the-knee amputation on the left due to peripheral vascular insufficiency on the left. He now has a stump that is well healed and he is working with physical therapy to rehabilitate himself.  4. Elevated serum creatinine -BMP  today  5. S/P AKA (above knee amputation) unilateral, left -Continue with physical therapy  Patient Instructions  The patient should continue with his outpatient rehabilitation therapy for his above the knee amputation on the left. He should be encouraged to use moisturizing lotions on his right foot and someone should check the right foot every day to make sure there is no sign of any infection. He should continue to wear the TEDs hose and the should be put on the first thing in the morning with arising from his bed. He should be reminded to be patient with the process and work with the physical therapist so that he doesn't fall and hurt himself. He has come a long way for a man of his age to go through what he has come through and to be doing as well as he is doing at the present time   Arrie Senate MD

## 2014-12-18 NOTE — Patient Instructions (Signed)
The patient should continue with his outpatient rehabilitation therapy for his above the knee amputation on the left. He should be encouraged to use moisturizing lotions on his right foot and someone should check the right foot every day to make sure there is no sign of any infection. He should continue to wear the TEDs hose and the should be put on the first thing in the morning with arising from his bed. He should be reminded to be patient with the process and work with the physical therapist so that he doesn't fall and hurt himself. He has come a long way for a man of his age to go through what he has come through and to be doing as well as he is doing at the present time

## 2014-12-19 ENCOUNTER — Ambulatory Visit (INDEPENDENT_AMBULATORY_CARE_PROVIDER_SITE_OTHER): Payer: Medicare Other | Admitting: Physical Therapy

## 2014-12-19 DIAGNOSIS — R2681 Unsteadiness on feet: Secondary | ICD-10-CM

## 2014-12-19 DIAGNOSIS — R29818 Other symptoms and signs involving the nervous system: Secondary | ICD-10-CM

## 2014-12-19 DIAGNOSIS — R6889 Other general symptoms and signs: Secondary | ICD-10-CM | POA: Diagnosis not present

## 2014-12-19 DIAGNOSIS — Z89612 Acquired absence of left leg above knee: Secondary | ICD-10-CM | POA: Diagnosis not present

## 2014-12-19 DIAGNOSIS — R531 Weakness: Secondary | ICD-10-CM

## 2014-12-19 DIAGNOSIS — R269 Unspecified abnormalities of gait and mobility: Secondary | ICD-10-CM | POA: Diagnosis not present

## 2014-12-19 DIAGNOSIS — R2689 Other abnormalities of gait and mobility: Secondary | ICD-10-CM

## 2014-12-19 LAB — CMP14+EGFR
ALT: 14 IU/L (ref 0–44)
AST: 20 IU/L (ref 0–40)
Albumin/Globulin Ratio: 1.6 (ref 1.1–2.5)
Albumin: 4.2 g/dL (ref 3.2–4.6)
Alkaline Phosphatase: 173 IU/L — ABNORMAL HIGH (ref 39–117)
BUN/Creatinine Ratio: 23 — ABNORMAL HIGH (ref 10–22)
BUN: 21 mg/dL (ref 10–36)
Bilirubin Total: 0.2 mg/dL (ref 0.0–1.2)
CO2: 25 mmol/L (ref 18–29)
Calcium: 9.8 mg/dL (ref 8.6–10.2)
Chloride: 105 mmol/L (ref 97–108)
Creatinine, Ser: 0.93 mg/dL (ref 0.76–1.27)
GFR calc Af Amer: 83 mL/min/{1.73_m2} (ref >59)
GFR calc non Af Amer: 72 mL/min/{1.73_m2} (ref >59)
Globulin, Total: 2.6 g/dL (ref 1.5–4.5)
Glucose: 86 mg/dL (ref 65–99)
Potassium: 3.9 mmol/L (ref 3.5–5.2)
Sodium: 144 mmol/L (ref 134–144)
Total Protein: 6.8 g/dL (ref 6.0–8.5)

## 2014-12-19 LAB — CBC WITH DIFFERENTIAL/PLATELET
Basophils Absolute: 0 10*3/uL (ref 0.0–0.2)
Basos: 0 %
EOS (ABSOLUTE): 0.2 10*3/uL (ref 0.0–0.4)
Eos: 4 %
Hematocrit: 36.8 % — ABNORMAL LOW (ref 37.5–51.0)
Hemoglobin: 11.4 g/dL — ABNORMAL LOW (ref 12.6–17.7)
Immature Grans (Abs): 0 10*3/uL (ref 0.0–0.1)
Immature Granulocytes: 0 %
Lymphocytes Absolute: 1.3 10*3/uL (ref 0.7–3.1)
Lymphs: 32 %
MCH: 26.3 pg — ABNORMAL LOW (ref 26.6–33.0)
MCHC: 31 g/dL — ABNORMAL LOW (ref 31.5–35.7)
MCV: 85 fL (ref 79–97)
Monocytes Absolute: 0.5 10*3/uL (ref 0.1–0.9)
Monocytes: 12 %
Neutrophils Absolute: 2.1 10*3/uL (ref 1.4–7.0)
Neutrophils: 52 %
Platelets: 220 10*3/uL (ref 150–379)
RBC: 4.33 x10E6/uL (ref 4.14–5.80)
RDW: 16.5 % — ABNORMAL HIGH (ref 12.3–15.4)
WBC: 4.1 10*3/uL (ref 3.4–10.8)

## 2014-12-19 NOTE — Patient Instructions (Signed)
Do each exercise 1-2 times per day Do each exercise 10 repetitions Hold each exercise for 3-5 seconds to feel your location  AT SINK FIND YOUR MIDLINE POSITION AND PLACE FEET EQUAL DISTANCE FROM THE MIDLINE.  USE TAPE ON FLOOR TO MARK THE MIDLINE POSITION. You also should try to feel with your limb pressure in socket.  You are trying to feel with limb what you used to feel with the bottom of your foot.  1. Side to Side Shift: Moving your hips only (not shoulders): move weight onto your left leg, HOLD/FEEL.  Move back to equal weight on each leg, HOLD/FEEL. Move weight onto your right leg, HOLD/FEEL. Move back to equal weight on each leg, HOLD/FEEL. Repeat. 2. Front to Back Shift: Moving your hips only (not shoulders): move your weight forward onto your toes, HOLD/FEEL. Move your weight back to equal Flat Foot on both legs, HOLD/FEEL. Move your weight back onto your heels, HOLD/FEEL. Move your weight back to equal on both legs, HOLD/FEEL. Repeat. 3. Moving Cones / Cups: With equal weight on each leg: Hold on with one hand the first time, then progress to no hand supports. Move cups from one side of sink to the other. Place cups ~2" out of your reach, progress to 10" beyond reach. 4. Overhead/Upward Reaching: alternated reaching up to top cabinets or ceiling if no cabinets present. Keep equal weight on each leg. Start with one hand support on counter while other hand reaches and progress to no hand support with reaching. 5.   Looking Over Shoulders: With equal weight on each leg: alternate turning to look over your shoulders with one hand support on counter as needed. Shift weight to side looking, pull hip then shoulder then head/eyes around to look behind you. Start with one hand support & progress to no hand support. 

## 2014-12-19 NOTE — Therapy (Signed)
[facility] [address] [place], [place], [address] Phone: [phone]   Fax:  [phone]  Physical Therapy Treatment  Patient Details  Name: Scott [name] MRN: [id number] Date of Birth: 09-Nov-1921 Referring Provider:  Chipper Herb, MD  Encounter Date: 12/19/2014      PT End of Session - 12/19/14 0851    Visit Number 2   Number of Visits 18   Date for PT Re-Evaluation 02/14/15   Authorization Type Medicare Do G-code every 10th visit   PT Start Time 0824   PT Stop Time 0925   PT Time Calculation (min) 61 min   Equipment Utilized During Treatment Gait belt   Activity Tolerance Patient tolerated treatment well   Behavior During Therapy [facility] for tasks assessed/performed      Past Medical History  Diagnosis Date  . HTN (hypertension)   . Hyperlipidemia   . PVCs (premature ventricular contractions)   . Glaucoma   . PVD (peripheral vascular disease)     Right ABI .75, Left .78 (2006)  . Popliteal artery aneurysm, bilateral DECUMENTED CHRONIC PARTIAL OCCLUSION--  PT DENIES CLAUDICATION OR ANY OTHER SYMPTOMS  . Frequency of urination   . Nocturia   . DVT, bilateral lower limbs 03/2013    a. Bilat DVT dx 03/2013.  . Pulmonary embolism     a. By CT angio 04/2013.  Marland Kitchen Elevated troponin     a. Adm 12/30-04/2013 - not felt to represent ACS; ?due to PE. b. Patent cors 06/2012.  Marland Kitchen NSVT (nonsustained ventricular tachycardia)     a. NSVT 06/2012; NSVT also seen during 03/2013 adm. b. Med rx. Not candidate for ICD given adv age.  Marland Kitchen ETOH abuse   . Syncope     a. Felt to be postural syncope related to diuretics 06/2012.  Marland Kitchen Heart murmur   . DVT (deep venous thrombosis)     "I think in both legs"  . Pneumonia     "couple times"  . DDD (degenerative disc disease)   . Arthritis     "all over"  . Depression   . CAD- non obstructive disease by cath 3/14   . PAD (peripheral artery disease)   . Gangrene of toe 10/03/2014  .  Sepsis due to other etiology 10/03/2014  . Chronic systolic heart failure 07/01/00    EF improved 50-55%  . Chronic systolic CHF (congestive heart failure)     a. NICM - patent cors 06/2012, EF 40% at that time. b. 03/2013 eval: EF 20-25%.  . Chronic lower back pain   . Bladder cancer 03/15/2012    pt denies this hx on 10/15/2014    Past Surgical History  Procedure Laterality Date  . Transthoracic echocardiogram  09-22-2010    MODERATE LVH/ EF 54%/ GRADE I DIASTOLIC DYSFUNCTION/ AORTIC SCLEROSIS WITHOUT STENOSIS/  RV  SYSTOLIC  MILDLY REDUCED FUNCTION  . Cardiovascular stress test  10-15-2010    LOW RISK NUCLEAR STUDY/ NO EVIDENCE OF ISCHEMIA/ NORMAL EF  . Shoulder surgery  1970's    "don't remember which side or what kind of OR"  . Transurethral resection of bladder tumor  10/07/2011    Procedure: TRANSURETHRAL RESECTION OF BLADDER TUMOR (TURBT);  Surgeon: Malka So, MD;  Location: [facility];  Service: Urology;  Laterality: N/A;  . Cystoscopy  10/07/2011    Procedure: CYSTOSCOPY;  Surgeon: Malka So, MD;  Location: [facility];  Service: Urology;  Laterality: N/A;  .  Cystoscopy with biopsy  03/14/2012    Procedure: CYSTOSCOPY WITH BIOPSY;  Surgeon: Malka So, MD;  Location: [facility];  Service: Urology;  Laterality: N/A;  WITH FULGURATION  . Transurethral resection of prostate  03/14/2012    Procedure: TRANSURETHRAL RESECTION OF THE PROSTATE WITH GYRUS INSTRUMENTS;  Surgeon: Malka So, MD;  Location: [facility];  Service: Urology;  Laterality: N/A;  . Tonsillectomy    . Cataract extraction w/ intraocular lens  implant, bilateral Bilateral   . Cardiac catheterization  05-11-2004  DR Everton PLAQUE/ NORMAL LVF/ EF 55%/  NON-OBSTRUCTIVE LAD 25%  . Cardiac catheterization  06/2012  . Eye surgery    . Amputation Left 10/16/2014    Procedure: AMPUTATION ABOVE KNEE- LEFT;  Surgeon: Mal Misty, MD;  Location: [facility];  Service: Vascular;   Laterality: Left;    There were no vitals filed for this visit.  Visit Diagnosis:  Functional weakness  Abnormality of gait  Decreased functional activity tolerance  Unsteadiness  Balance problems         [facility] Adult PT Treatment/Exercise - 12/19/14 0851    Ambulation/Gait   Ambulation/Gait Yes   Ambulation/Gait Assistance 4: Min assist   Ambulation/Gait Assistance Details multimodal cues needed for posture, sequence, prosthetic knee control and to correct gait deviations.                         Ambulation Distance (Feet) 50 Feet  feet x1, 30 feet x1   Assistive device Prosthesis;Rolling walker   Gait Pattern Step-to pattern;Decreased step length - right;Decreased stance time - left;Decreased stride length;Decreased hip/knee flexion - left;Decreased weight shift to left;Left hip hike;Left circumduction;Trunk flexed;Abducted - left;Poor foot clearance - left   Ambulation Surface Level;Indoor   Prosthetics   Current prosthetic wear tolerance (days/week)  daily   Current prosthetic wear tolerance (#hours/day)  2 hours 2x day    Edema none noted   Education Provided Residual limb care;Proper Donning;Proper Doffing;Proper wear schedule/adjustment   Person(s) Educated Patient   Education Method Explanation;Verbal cues;Demonstration   Education Method Verbalized understanding;Needs further instruction   Donning Prosthesis Moderate assist   Doffing Prosthesis Minimal assist             PT Education - 12/19/14 2217    Education provided Yes   Education Details HEP: sink HEP for balance and proprioception   Person(s) Educated Patient;Child(ren)   Methods Explanation;Demonstration;Verbal cues;Handout   Comprehension Verbalized understanding;Need further instruction;Returned demonstration          PT Short Term Goals - 12/17/14 1445    PT SHORT TERM GOAL #1   Title Patient donnes prosthesis correctly modified independent. (Target Date: 01/15/2015)   Time 1   Period  Months   Status New   PT SHORT TERM GOAL #2   Title Patient tolerates wear >10hrs total per day without skin issues or limb pain.  (Target Date: 01/15/2015)   Time 1   Period Months   Status New   PT SHORT TERM GOAL #3   Title Patient performs standing balance with RW & prosthesis reaching 10", to floor and manages pants for toileting modified inependent.  (Target Date: 01/15/2015)   Time 1   Period Months   Status New   PT SHORT TERM GOAL #4   Title Patient ambulates 200' with RW & prosthesis with SBA.  (Target Date: 01/15/2015)   Time 1   Period Months   Status  New   PT SHORT TERM GOAL #5   Title Patient negotiates stairs (2 rails), ramps & curbs with RW & prosthesis with Minimal assist.  (Target Date: 01/15/2015)   Time 1   Period Months   Status New           PT Long Term Goals - 12/17/14 1445    PT LONG TERM GOAL #1   Title Patient verbalizes proper prosthetic care.  (Target Date: 02/14/2015)   Time 2   Period Months   Status New   PT LONG TERM GOAL #2   Title Patient tolerates wear of prosthesis >80% of awake hours without skin issues or limb pain.  (Target Date: 02/14/2015)   Time 2   Period Months   Status New   PT LONG TERM GOAL #3   Title Berg Balance >25/56  (Target Date: 02/14/2015)   Time 2   Period Months   Status New   PT LONG TERM GOAL #4   Title Patient ambulates 300' with LRAD & prosthesis modified independent to enable community mobility.  (Target Date: 02/14/2015)   Time 2   Period Months   Status New   PT LONG TERM GOAL #5   Title Patient negotiates stairs, ramps & curbs with LRAD & prosthesis modified independent to enable community mobility.  (Target Date: 02/14/2015)   Time 2   Period Months   Status New   Additional Long Term Goals   Additional Long Term Goals Yes   PT LONG TERM GOAL #6   Title Patient ambulates household around furniture with LRAD modified independent.  (Target Date: 02/14/2015)   Time 2   Period Months   Status New            Plan - 12/19/14 0851    Clinical Impression Statement Pt late today due to heavy traffic, therefore his session started with one therapist and was completed by a second therapist. Pt edcuated on HEP to assist with balance, prothetic weight bearing and propriocepiton with standing with prosthesis. Needed cues with gait to correct gait devaiations. Pt making steady progress toward goals.                           Pt will benefit from skilled therapeutic intervention in order to improve on the following deficits Abnormal gait;Decreased activity tolerance;Decreased balance;Decreased endurance;Decreased knowledge of use of DME;Decreased mobility;Decreased range of motion;Decreased skin integrity;Decreased strength;Postural dysfunction;Prosthetic Dependency   Rehab Potential Good   PT Frequency 2x / week   PT Duration Other (comment)  9 weeks (60 days)   PT Treatment/Interventions ADLs/Self Care Home Management;DME Instruction;Gait training;Stair training;Functional mobility training;Therapeutic activities;Therapeutic exercise;Balance training;Neuromuscular re-education;Patient/family education;Prosthetic Training;Manual techniques   PT Next Visit Plan review prosthetic care, gait with RW, add barriers as able; car transfers with prosthesis/RW to allow pt to wear prosthesis to/from therapy/appointments as wear time is increased.   PT Home Exercise Plan Mid-line at sink   Consulted and Agree with Plan of Care Patient;Family member/caregiver   Family Member Consulted son, [name]        Problem List Patient Active Problem List   Diagnosis Date Noted  . Status post above knee amputation of left lower extremity 10/18/2014  . Tachyarrhythmia 10/05/2014  . Aspiration pneumonia 10/05/2014  . UTI (lower urinary tract infection) 10/03/2014  . Sepsis due to other etiology 10/03/2014  . Gangrene of toe 10/03/2014  . Ischemic ulcer of left foot 10/03/2014  . Atherosclerotic PVD  with ulceration  10/03/2014  . Warfarin-induced coagulopathy 10/03/2014  . Atrial fibrillation with RVR 10/03/2014  . Acute kidney injury 10/03/2014  . Hyperkalemia 10/03/2014  . Cellulitis of left leg 10/03/2014  . PAD (peripheral artery disease) 09/24/2014  . NSTEMI (non-ST elevated myocardial infarction) 10/09/2013  . High risk medication use 05/17/2013  . BPH (benign prostatic hyperplasia) 05/17/2013  . Vitamin D deficiency 05/17/2013  . History of pulmonary embolism (2014) 04/17/2013  . NSVT (nonsustained ventricular tachycardia)- not an ICD candidate 04/17/2013  . NICM (nonischemic cardiomyopathy)- EF 20-25% by Echo 04/12/13 04/17/2013  . Chronic diastolic heart failure, NYHA class 1 03/15/2013  . DVT of lower extremity, bilateral 03/15/2013  . Fatigue 11/02/2012  . Constipation 11/02/2012  . Alcohol abuse 08/27/2012  . Syncope 06/21/2012  . Bladder cancer 03/15/2012  . Benign localized hyperplasia of prostate with urinary obstruction 03/15/2012  . Benign essential HTN   . Hyperlipidemia   . PVCs (premature ventricular contractions)   . CAD- non obstructive disease by cath 3/14     Willow Ora 12/19/2014, 10:22 PM  Willow Ora, PTA, [facility] [address] [place],  [id number] [phone] 12/19/2014, 10:22 PM

## 2014-12-20 DIAGNOSIS — R32 Unspecified urinary incontinence: Secondary | ICD-10-CM | POA: Diagnosis not present

## 2014-12-20 LAB — POCT UA - MICROSCOPIC ONLY
Bacteria, U Microscopic: NEGATIVE
Casts, Ur, LPF, POC: NEGATIVE
Crystals, Ur, HPF, POC: NEGATIVE
Mucus, UA: NEGATIVE
RBC, urine, microscopic: NEGATIVE
Yeast, UA: NEGATIVE

## 2014-12-20 LAB — POCT URINALYSIS DIPSTICK
Bilirubin, UA: NEGATIVE
Blood, UA: NEGATIVE
Glucose, UA: NEGATIVE
Ketones, UA: NEGATIVE
Nitrite, UA: NEGATIVE
Protein, UA: NEGATIVE
Spec Grav, UA: 1.025
Urobilinogen, UA: NEGATIVE
pH, UA: 6

## 2014-12-20 NOTE — Addendum Note (Signed)
Addended by: Zannie Cove on: 12/20/2014 11:47 AM   Modules accepted: Orders

## 2014-12-22 LAB — URINE CULTURE

## 2014-12-23 ENCOUNTER — Encounter (HOSPITAL_COMMUNITY)
Admission: AD | Admit: 2014-12-23 | Discharge: 2014-12-23 | Disposition: A | Discharge status: Home / Self Care | Payer: Medicare Other | Source: Skilled Nursing Facility | Attending: Internal Medicine | Admitting: Internal Medicine

## 2014-12-23 MED ORDER — DOXYCYCLINE HYCLATE 100 MG PO TABS: 100.0000 mg | ORAL_TABLET | Freq: Two times a day (BID) | ORAL | Status: DC

## 2014-12-23 NOTE — Addendum Note (Signed)
Addended by: Ilean China on: 12/23/2014 12:08 PM   Modules accepted: Orders

## 2014-12-24 ENCOUNTER — Encounter: Payer: Self-pay | Admitting: Physical Therapy

## 2014-12-24 ENCOUNTER — Ambulatory Visit: Payer: Medicare Other | Admitting: Physical Therapy

## 2014-12-24 DIAGNOSIS — R269 Unspecified abnormalities of gait and mobility: Secondary | ICD-10-CM | POA: Diagnosis not present

## 2014-12-24 DIAGNOSIS — R2681 Unsteadiness on feet: Secondary | ICD-10-CM | POA: Diagnosis not present

## 2014-12-24 DIAGNOSIS — Z89612 Acquired absence of left leg above knee: Secondary | ICD-10-CM | POA: Diagnosis not present

## 2014-12-24 DIAGNOSIS — R531 Weakness: Secondary | ICD-10-CM | POA: Diagnosis not present

## 2014-12-24 DIAGNOSIS — R6889 Other general symptoms and signs: Secondary | ICD-10-CM | POA: Diagnosis not present

## 2014-12-24 DIAGNOSIS — R2689 Other abnormalities of gait and mobility: Secondary | ICD-10-CM

## 2014-12-24 DIAGNOSIS — R29818 Other symptoms and signs involving the nervous system: Secondary | ICD-10-CM | POA: Diagnosis not present

## 2014-12-25 NOTE — Therapy (Signed)
[facility] [address] [place], [place], [address] Phone: [phone]   Fax:  [phone]  Physical Therapy Treatment  Patient Details  Name: Scott [name] MRN: [id number] Date of Birth: 1922/02/26 Referring Provider:  Chipper Herb, MD  Encounter Date: 12/24/2014      PT End of Session - 12/24/14 1413    Visit Number 3   Number of Visits 18   Date for PT Re-Evaluation 02/14/15   Authorization Type Medicare Do G-code every 10th visit   PT Start Time 1405   PT Stop Time 1445   PT Time Calculation (min) 40 min   Equipment Utilized During Treatment Gait belt   Activity Tolerance Patient tolerated treatment well   Behavior During Therapy [facility] for tasks assessed/performed      Past Medical History  Diagnosis Date  . HTN (hypertension)   . Hyperlipidemia   . PVCs (premature ventricular contractions)   . Glaucoma   . PVD (peripheral vascular disease)     Right ABI .75, Left .78 (2006)  . Popliteal artery aneurysm, bilateral DECUMENTED CHRONIC PARTIAL OCCLUSION--  PT DENIES CLAUDICATION OR ANY OTHER SYMPTOMS  . Frequency of urination   . Nocturia   . DVT, bilateral lower limbs 03/2013    a. Bilat DVT dx 03/2013.  . Pulmonary embolism     a. By CT angio 04/2013.  Marland Kitchen Elevated troponin     a. Adm 12/30-04/2013 - not felt to represent ACS; ?due to PE. b. Patent cors 06/2012.  Marland Kitchen NSVT (nonsustained ventricular tachycardia)     a. NSVT 06/2012; NSVT also seen during 03/2013 adm. b. Med rx. Not candidate for ICD given adv age.  Marland Kitchen ETOH abuse   . Syncope     a. Felt to be postural syncope related to diuretics 06/2012.  Marland Kitchen Heart murmur   . DVT (deep venous thrombosis)     "I think in both legs"  . Pneumonia     "couple times"  . DDD (degenerative disc disease)   . Arthritis     "all over"  . Depression   . CAD- non obstructive disease by cath 3/14   . PAD (peripheral artery disease)   . Gangrene of toe 10/03/2014  .  Sepsis due to other etiology 10/03/2014  . Chronic systolic heart failure 07/30/35    EF improved 50-55%  . Chronic systolic CHF (congestive heart failure)     a. NICM - patent cors 06/2012, EF 40% at that time. b. 03/2013 eval: EF 20-25%.  . Chronic lower back pain   . Bladder cancer 03/15/2012    pt denies this hx on 10/15/2014    Past Surgical History  Procedure Laterality Date  . Transthoracic echocardiogram  09-22-2010    MODERATE LVH/ EF 90%/ GRADE I DIASTOLIC DYSFUNCTION/ AORTIC SCLEROSIS WITHOUT STENOSIS/  RV  SYSTOLIC  MILDLY REDUCED FUNCTION  . Cardiovascular stress test  10-15-2010    LOW RISK NUCLEAR STUDY/ NO EVIDENCE OF ISCHEMIA/ NORMAL EF  . Shoulder surgery  1970's    "don't remember which side or what kind of OR"  . Transurethral resection of bladder tumor  10/07/2011    Procedure: TRANSURETHRAL RESECTION OF BLADDER TUMOR (TURBT);  Surgeon: Malka So, MD;  Location: [facility];  Service: Urology;  Laterality: N/A;  . Cystoscopy  10/07/2011    Procedure: CYSTOSCOPY;  Surgeon: Malka So, MD;  Location: [facility];  Service: Urology;  Laterality: N/A;  .  Cystoscopy with biopsy  03/14/2012    Procedure: CYSTOSCOPY WITH BIOPSY;  Surgeon: Malka So, MD;  Location: [facility];  Service: Urology;  Laterality: N/A;  WITH FULGURATION  . Transurethral resection of prostate  03/14/2012    Procedure: TRANSURETHRAL RESECTION OF THE PROSTATE WITH GYRUS INSTRUMENTS;  Surgeon: Malka So, MD;  Location: [facility];  Service: Urology;  Laterality: N/A;  . Tonsillectomy    . Cataract extraction w/ intraocular lens  implant, bilateral Bilateral   . Cardiac catheterization  05-11-2004  DR [place] PLAQUE/ NORMAL LVF/ EF 55%/  NON-OBSTRUCTIVE LAD 25%  . Cardiac catheterization  06/2012  . Eye surgery    . Amputation Left 10/16/2014    Procedure: AMPUTATION ABOVE KNEE- LEFT;  Surgeon: Mal Misty, MD;  Location: [place];  Service: Vascular;   Laterality: Left;    There were no vitals filed for this visit.  Visit Diagnosis:  Functional weakness  Abnormality of gait  Decreased functional activity tolerance  Unsteadiness  Balance problems      Subjective Assessment - 12/24/14 1412    Subjective No new complaints. No pain or falls to report.    Currently in Pain? No/denies   Pain Score 0-No pain          [facility] Adult PT Treatment/Exercise - 12/24/14 1413    Transfers   Sit to Stand 5: Supervision;From elevated surface;With upper extremity assist;With armrests;From chair/3-in-1   Sit to Stand Details Verbal cues for sequencing;Verbal cues for technique;Verbal cues for safe use of DME/AE   Stand to Sit 5: Supervision;With upper extremity assist;With armrests;To chair/3-in-1   Stand to Sit Details (indicate cue type and reason) Verbal cues for technique;Verbal cues for precautions/safety;Verbal cues for safe use of DME/AE   Stand Pivot Transfers 4: Min assist  x 2 to right and x 1 to left   Ambulation/Gait   Ambulation/Gait Yes   Ambulation/Gait Assistance 4: Min assist;3: Mod assist   Ambulation/Gait Assistance Details increased assist as gait progressed. multimodal cues on posture, step lenght and to increase weight shift onto left leg in stance phase   Ambulation Distance (Feet) 45 Feet  x1, 17 feet x 2   Assistive device Prosthesis;Rolling walker   Gait Pattern Step-to pattern;Decreased step length - right;Decreased stance time - left;Decreased stride length;Decreased hip/knee flexion - left;Decreased weight shift to left;Left hip hike;Left circumduction;Trunk flexed;Abducted - left;Poor foot clearance - left   Ambulation Surface Level;Indoor   Prosthetics   Prosthetic Care Comments  pt able to almost fully don liner today with minimal cues, needed assit to get it on at top under shorts/boxers. assist to place prosthesis on under clothing as well   Current prosthetic wear tolerance (days/week)  daily   Current  prosthetic wear tolerance (#hours/day)  2 hours 2x day    Edema none noted   Residual limb condition  skin intact   Education Provided Proper Donning;Proper Doffing;Residual limb care;Proper wear schedule/adjustment   Person(s) Educated Patient   Education Method Explanation;Verbal cues;Demonstration   Education Method Verbal cues required;Verbalized understanding   Donning Prosthesis Minimal assist   Doffing Prosthesis Minimal assist           PT Short Term Goals - 12/17/14 1445    PT SHORT TERM GOAL #1   Title Patient donnes prosthesis correctly modified independent. (Target Date: 01/15/2015)   Time 1   Period Months   Status New   PT SHORT TERM GOAL #2   Title  Patient tolerates wear >10hrs total per day without skin issues or limb pain.  (Target Date: 01/15/2015)   Time 1   Period Months   Status New   PT SHORT TERM GOAL #3   Title Patient performs standing balance with RW & prosthesis reaching 10", to floor and manages pants for toileting modified inependent.  (Target Date: 01/15/2015)   Time 1   Period Months   Status New   PT SHORT TERM GOAL #4   Title Patient ambulates 200' with RW & prosthesis with SBA.  (Target Date: 01/15/2015)   Time 1   Period Months   Status New   PT SHORT TERM GOAL #5   Title Patient negotiates stairs (2 rails), ramps & curbs with RW & prosthesis with Minimal assist.  (Target Date: 01/15/2015)   Time 1   Period Months   Status New           PT Long Term Goals - 12/17/14 1445    PT LONG TERM GOAL #1   Title Patient verbalizes proper prosthetic care.  (Target Date: 02/14/2015)   Time 2   Period Months   Status New   PT LONG TERM GOAL #2   Title Patient tolerates wear of prosthesis >80% of awake hours without skin issues or limb pain.  (Target Date: 02/14/2015)   Time 2   Period Months   Status New   PT LONG TERM GOAL #3   Title Berg Balance >25/56  (Target Date: 02/14/2015)   Time 2   Period Months   Status New   PT LONG TERM GOAL #4    Title Patient ambulates 300' with LRAD & prosthesis modified independent to enable community mobility.  (Target Date: 02/14/2015)   Time 2   Period Months   Status New   PT LONG TERM GOAL #5   Title Patient negotiates stairs, ramps & curbs with LRAD & prosthesis modified independent to enable community mobility.  (Target Date: 02/14/2015)   Time 2   Period Months   Status New   Additional Long Term Goals   Additional Long Term Goals Yes   PT LONG TERM GOAL #6   Title Patient ambulates household around furniture with LRAD modified independent.  (Target Date: 02/14/2015)   Time 2   Period Months   Status New          Plan - 12/24/14 1413    Clinical Impression Statement Pt quick to fatigue with activity today, needing rest breaks. Pt continues to demo decreased weight shifting onto prosthesis with gait/activity despite cues. May need to work on this in the parallel bars next session for improved carry over. Pt making steady progress toward goals.   Pt will benefit from skilled therapeutic intervention in order to improve on the following deficits Abnormal gait;Decreased activity tolerance;Decreased balance;Decreased endurance;Decreased knowledge of use of DME;Decreased mobility;Decreased range of motion;Decreased skin integrity;Decreased strength;Postural dysfunction;Prosthetic Dependency   Rehab Potential Good   PT Frequency 2x / week   PT Duration Other (comment)  9 weeks (60 days)   PT Treatment/Interventions ADLs/Self Care Home Management;DME Instruction;Gait training;Stair training;Functional mobility training;Therapeutic activities;Therapeutic exercise;Balance training;Neuromuscular re-education;Patient/family education;Prosthetic Training;Manual techniques   PT Next Visit Plan review prosthetic care, gait with RW, maybe try parallel bars to work on prosthetic weight shifitng with stance; car transfers with prosthesis/RW to allow pt to wear prosthesis to/from therapy/appointments as  wear time is increased.   PT Home Exercise Plan Mid-line at sink   Consulted and Agree with  Plan of Care Patient;Family member/caregiver   Family Member Consulted son, [name]        Problem List Patient Active Problem List   Diagnosis Date Noted  . Status post above knee amputation of left lower extremity 10/18/2014  . Tachyarrhythmia 10/05/2014  . Aspiration pneumonia 10/05/2014  . UTI (lower urinary tract infection) 10/03/2014  . Sepsis due to other etiology 10/03/2014  . Gangrene of toe 10/03/2014  . Ischemic ulcer of left foot 10/03/2014  . Atherosclerotic PVD with ulceration 10/03/2014  . Warfarin-induced coagulopathy 10/03/2014  . Atrial fibrillation with RVR 10/03/2014  . Acute kidney injury 10/03/2014  . Hyperkalemia 10/03/2014  . Cellulitis of left leg 10/03/2014  . PAD (peripheral artery disease) 09/24/2014  . NSTEMI (non-ST elevated myocardial infarction) 10/09/2013  . High risk medication use 05/17/2013  . BPH (benign prostatic hyperplasia) 05/17/2013  . Vitamin D deficiency 05/17/2013  . History of pulmonary embolism (2014) 04/17/2013  . NSVT (nonsustained ventricular tachycardia)- not an ICD candidate 04/17/2013  . NICM (nonischemic cardiomyopathy)- EF 20-25% by Echo 04/12/13 04/17/2013  . Chronic diastolic heart failure, NYHA class 1 03/15/2013  . DVT of lower extremity, bilateral 03/15/2013  . Fatigue 11/02/2012  . Constipation 11/02/2012  . Alcohol abuse 08/27/2012  . Syncope 06/21/2012  . Bladder cancer 03/15/2012  . Benign localized hyperplasia of prostate with urinary obstruction 03/15/2012  . Benign essential HTN   . Hyperlipidemia   . PVCs (premature ventricular contractions)   . CAD- non obstructive disease by cath 3/14     Willow Ora 12/25/2014, 10:01 AM  [facility] [address] [place], [place], [address] Phone: [phone]   Fax:  [phone]

## 2014-12-26 ENCOUNTER — Ambulatory Visit: Payer: Medicare Other | Admitting: Physical Therapy

## 2014-12-26 ENCOUNTER — Encounter: Payer: Self-pay | Admitting: Physical Therapy

## 2014-12-26 DIAGNOSIS — R2681 Unsteadiness on feet: Secondary | ICD-10-CM

## 2014-12-26 DIAGNOSIS — R531 Weakness: Secondary | ICD-10-CM | POA: Diagnosis not present

## 2014-12-26 DIAGNOSIS — Z89612 Acquired absence of left leg above knee: Secondary | ICD-10-CM | POA: Diagnosis not present

## 2014-12-26 DIAGNOSIS — R269 Unspecified abnormalities of gait and mobility: Secondary | ICD-10-CM

## 2014-12-26 DIAGNOSIS — R6889 Other general symptoms and signs: Secondary | ICD-10-CM

## 2014-12-26 DIAGNOSIS — R29818 Other symptoms and signs involving the nervous system: Secondary | ICD-10-CM | POA: Diagnosis not present

## 2014-12-26 DIAGNOSIS — Z4789 Encounter for other orthopedic aftercare: Secondary | ICD-10-CM

## 2014-12-26 DIAGNOSIS — R2689 Other abnormalities of gait and mobility: Secondary | ICD-10-CM

## 2014-12-27 ENCOUNTER — Ambulatory Visit: Payer: Medicare Other | Admitting: Physical Therapy

## 2014-12-27 NOTE — Therapy (Signed)
[facility] [address] [place], [place], [address] Phone: [phone]   Fax:  [phone]  Physical Therapy Treatment  Patient Details  Name: Scott [name] MRN: [id number] Date of Birth: [birth year] Referring Provider:  Chipper Herb, MD  Encounter Date: 12/26/2014      PT End of Session - 12/26/14 1445    Visit Number 4   Number of Visits 18   Date for PT Re-Evaluation 02/14/15   Authorization Type Medicare Do G-code every 10th visit   PT Start Time 1445   PT Stop Time 1530   PT Time Calculation (min) 45 min   Equipment Utilized During Treatment Gait belt   Activity Tolerance Patient tolerated treatment well   Behavior During Therapy [facility] for tasks assessed/performed      Past Medical History  Diagnosis Date  . HTN (hypertension)   . Hyperlipidemia   . PVCs (premature ventricular contractions)   . Glaucoma   . PVD (peripheral vascular disease)     Right ABI .75, Left .78 (2006)  . Popliteal artery aneurysm, bilateral DECUMENTED CHRONIC PARTIAL OCCLUSION--  PT DENIES CLAUDICATION OR ANY OTHER SYMPTOMS  . Frequency of urination   . Nocturia   . DVT, bilateral lower limbs 03/2013    a. Bilat DVT dx 03/2013.  . Pulmonary embolism     a. By CT angio 04/2013.  Marland Kitchen Elevated troponin     a. Adm 12/30-04/2013 - not felt to represent ACS; ?due to PE. b. Patent cors 06/2012.  Marland Kitchen NSVT (nonsustained ventricular tachycardia)     a. NSVT 06/2012; NSVT also seen during 03/2013 adm. b. Med rx. Not candidate for ICD given adv age.  Marland Kitchen ETOH abuse   . Syncope     a. Felt to be postural syncope related to diuretics 06/2012.  Marland Kitchen Heart murmur   . DVT (deep venous thrombosis)     "I think in both legs"  . Pneumonia     "couple times"  . DDD (degenerative disc disease)   . Arthritis     "all over"  . Depression   . CAD- non obstructive disease by cath 3/14   . PAD (peripheral artery disease)   . Gangrene of toe 10/03/2014  .  Sepsis due to other etiology 10/03/2014  . Chronic systolic heart failure 9/48/54    EF improved 50-55%  . Chronic systolic CHF (congestive heart failure)     a. NICM - patent cors 06/2012, EF 40% at that time. b. 03/2013 eval: EF 20-25%.  . Chronic lower back pain   . Bladder cancer 03/15/2012    pt denies this hx on 10/15/2014    Past Surgical History  Procedure Laterality Date  . Transthoracic echocardiogram  09-22-2010    MODERATE LVH/ EF 62%/ GRADE I DIASTOLIC DYSFUNCTION/ AORTIC SCLEROSIS WITHOUT STENOSIS/  RV  SYSTOLIC  MILDLY REDUCED FUNCTION  . Cardiovascular stress test  10-15-2010    LOW RISK NUCLEAR STUDY/ NO EVIDENCE OF ISCHEMIA/ NORMAL EF  . Shoulder surgery  1970's    "don't remember which side or what kind of OR"  . Transurethral resection of bladder tumor  10/07/2011    Procedure: TRANSURETHRAL RESECTION OF BLADDER TUMOR (TURBT);  Surgeon: Malka So, MD;  Location: [facility];  Service: Urology;  Laterality: N/A;  . Cystoscopy  10/07/2011    Procedure: CYSTOSCOPY;  Surgeon: Malka So, MD;  Location: [facility];  Service: Urology;  Laterality: N/A;  .  Cystoscopy with biopsy  03/14/2012    Procedure: CYSTOSCOPY WITH BIOPSY;  Surgeon: Malka So, MD;  Location: [facility];  Service: Urology;  Laterality: N/A;  WITH FULGURATION  . Transurethral resection of prostate  03/14/2012    Procedure: TRANSURETHRAL RESECTION OF THE PROSTATE WITH GYRUS INSTRUMENTS;  Surgeon: Malka So, MD;  Location: [facility];  Service: Urology;  Laterality: N/A;  . Tonsillectomy    . Cataract extraction w/ intraocular lens  implant, bilateral Bilateral   . Cardiac catheterization  05-11-2004  DR [place] PLAQUE/ NORMAL LVF/ EF 55%/  NON-OBSTRUCTIVE LAD 25%  . Cardiac catheterization  06/2012  . Eye surgery    . Amputation Left 10/16/2014    Procedure: AMPUTATION ABOVE KNEE- LEFT;  Surgeon: Mal Misty, MD;  Location: [place];  Service: Vascular;   Laterality: Left;    There were no vitals filed for this visit.  Visit Diagnosis:  Functional weakness  Abnormality of gait  Decreased functional activity tolerance  Unsteadiness  Balance problems  Status post above knee amputation of left lower extremity  Encounter for prosthetic gait training      Subjective Assessment - 12/26/14 1400    Subjective Prosthetist is asking if he is ready to switch temporary socket to laminated version.    Currently in Pain? No/denies                         [facility] Adult PT Treatment/Exercise - 12/26/14 1400    Transfers   Sit to Stand 5: Supervision;From elevated surface;With upper extremity assist;With armrests;From chair/3-in-1   Sit to Stand Details Verbal cues for sequencing;Verbal cues for technique;Verbal cues for safe use of DME/AE   Stand to Sit 5: Supervision;With upper extremity assist;With armrests;To chair/3-in-1   Stand to Sit Details (indicate cue type and reason) Verbal cues for technique;Verbal cues for precautions/safety;Verbal cues for safe use of DME/AE   Stand Pivot Transfers 4: Min guard  with RW & prosthesis, car transfers with RW   Stand Pivot Transfer Details (indicate cue type and reason) PT demo, verbal cues on technique in/out car with RW,   Ambulation/Gait   Ambulation/Gait Yes   Ambulation/Gait Assistance 4: Min assist   Ambulation/Gait Assistance Details initial contact with heel to facilitate knee extension, posture & sequence   Ambulation Distance (Feet) 70 Feet  70' X 2   Assistive device Prosthesis;Rolling walker   Gait Pattern Step-to pattern;Decreased step length - right;Decreased stance time - left;Decreased stride length;Decreased hip/knee flexion - left;Decreased weight shift to left;Left hip hike;Left circumduction;Trunk flexed;Abducted - left;Poor foot clearance - left   Ambulation Surface Indoor;Level   Stairs Yes   Stairs Assistance 4: Min assist   Stairs Assistance Details  (indicate cue type and reason) verbal & manual cues on sequence & technique   Stair Management Technique Two rails;Step to pattern;Forwards   Number of Stairs 4   Prosthetics   Prosthetic Care Comments  PT recommended waiting a couple of weeks, but need to start using prosthetic socks to control how deep he seats into socket.   Prosthetist to mail socks to patient after PT phone cal   Current prosthetic wear tolerance (days/week)  daily   Current prosthetic wear tolerance (#hours/day)  increase to 3 hours 2x day    Edema none noted   Residual limb condition  skin intact   Education Provided Proper Donning;Proper Doffing;Residual limb care;Proper wear schedule/adjustment   Person(s) Educated Patient  Education Method Explanation;Verbal cues   Education Method Verbalized understanding;Verbal cues required;Needs further instruction   Donning Prosthesis Minimal assist   Doffing Prosthesis Supervision                PT Education - 12/26/14 1445    Education provided Yes   Education Details Car transfer using RW & prosthesis   Person(s) Educated Patient   Methods Explanation;Demonstration;Tactile cues;Verbal cues   Comprehension Verbalized understanding;Returned demonstration;Verbal cues required;Tactile cues required;Need further instruction          PT Short Term Goals - 12/17/14 1445    PT SHORT TERM GOAL #1   Title Patient donnes prosthesis correctly modified independent. (Target Date: 01/15/2015)   Time 1   Period Months   Status New   PT SHORT TERM GOAL #2   Title Patient tolerates wear >10hrs total per day without skin issues or limb pain.  (Target Date: 01/15/2015)   Time 1   Period Months   Status New   PT SHORT TERM GOAL #3   Title Patient performs standing balance with RW & prosthesis reaching 10", to floor and manages pants for toileting modified inependent.  (Target Date: 01/15/2015)   Time 1   Period Months   Status New   PT SHORT TERM GOAL #4   Title  Patient ambulates 200' with RW & prosthesis with SBA.  (Target Date: 01/15/2015)   Time 1   Period Months   Status New   PT SHORT TERM GOAL #5   Title Patient negotiates stairs (2 rails), ramps & curbs with RW & prosthesis with Minimal assist.  (Target Date: 01/15/2015)   Time 1   Period Months   Status New           PT Long Term Goals - 12/17/14 1445    PT LONG TERM GOAL #1   Title Patient verbalizes proper prosthetic care.  (Target Date: 02/14/2015)   Time 2   Period Months   Status New   PT LONG TERM GOAL #2   Title Patient tolerates wear of prosthesis >80% of awake hours without skin issues or limb pain.  (Target Date: 02/14/2015)   Time 2   Period Months   Status New   PT LONG TERM GOAL #3   Title Berg Balance >25/56  (Target Date: 02/14/2015)   Time 2   Period Months   Status New   PT LONG TERM GOAL #4   Title Patient ambulates 300' with LRAD & prosthesis modified independent to enable community mobility.  (Target Date: 02/14/2015)   Time 2   Period Months   Status New   PT LONG TERM GOAL #5   Title Patient negotiates stairs, ramps & curbs with LRAD & prosthesis modified independent to enable community mobility.  (Target Date: 02/14/2015)   Time 2   Period Months   Status New   Additional Long Term Goals   Additional Long Term Goals Yes   PT LONG TERM GOAL #6   Title Patient ambulates household around furniture with LRAD modified independent.  (Target Date: 02/14/2015)   Time 2   Period Months   Status New               Plan - 12/26/14 1445    Clinical Impression Statement Patient improved car transfers with prosthesis & RW with instruction & practice. Patient needs to begin using prosthetic socks to control depth his limb seats in socket.    Pt will benefit from skilled  therapeutic intervention in order to improve on the following deficits Abnormal gait;Decreased activity tolerance;Decreased balance;Decreased endurance;Decreased knowledge of use of  DME;Decreased mobility;Decreased range of motion;Decreased skin integrity;Decreased strength;Postural dysfunction;Prosthetic Dependency   Rehab Potential Good   PT Frequency 2x / week   PT Duration Other (comment)  9 weeks (60 days)   PT Treatment/Interventions ADLs/Self Care Home Management;DME Instruction;Gait training;Stair training;Functional mobility training;Therapeutic activities;Therapeutic exercise;Balance training;Neuromuscular re-education;Patient/family education;Prosthetic Training;Manual techniques   PT Next Visit Plan instruct in adjusting ply socks, gait with RW & prosthesis, review stairs & introduce ramps & curbs   Consulted and Agree with Plan of Care Patient;Family member/caregiver   Family Member Consulted aid        Problem List Patient Active Problem List   Diagnosis Date Noted  . Status post above knee amputation of left lower extremity 10/18/2014  . Tachyarrhythmia 10/05/2014  . Aspiration pneumonia 10/05/2014  . UTI (lower urinary tract infection) 10/03/2014  . Sepsis due to other etiology 10/03/2014  . Gangrene of toe 10/03/2014  . Ischemic ulcer of left foot 10/03/2014  . Atherosclerotic PVD with ulceration 10/03/2014  . Warfarin-induced coagulopathy 10/03/2014  . Atrial fibrillation with RVR 10/03/2014  . Acute kidney injury 10/03/2014  . Hyperkalemia 10/03/2014  . Cellulitis of left leg 10/03/2014  . PAD (peripheral artery disease) 09/24/2014  . NSTEMI (non-ST elevated myocardial infarction) 10/09/2013  . High risk medication use 05/17/2013  . BPH (benign prostatic hyperplasia) 05/17/2013  . Vitamin D deficiency 05/17/2013  . History of pulmonary embolism (2014) 04/17/2013  . NSVT (nonsustained ventricular tachycardia)- not an ICD candidate 04/17/2013  . NICM (nonischemic cardiomyopathy)- EF 20-25% by Echo 04/12/13 04/17/2013  . Chronic diastolic heart failure, NYHA class 1 03/15/2013  . DVT of lower extremity, bilateral 03/15/2013  . Fatigue  11/02/2012  . Constipation 11/02/2012  . Alcohol abuse 08/27/2012  . Syncope 06/21/2012  . Bladder cancer 03/15/2012  . Benign localized hyperplasia of prostate with urinary obstruction 03/15/2012  . Benign essential HTN   . Hyperlipidemia   . PVCs (premature ventricular contractions)   . CAD- non obstructive disease by cath 3/14     Bion Todorov PT, DPT 12/27/2014, 2:12 PM  [facility] [address] [place], [place], [address] Phone: [phone]   Fax:  [phone]

## 2014-12-30 ENCOUNTER — Other Ambulatory Visit: Payer: Self-pay | Admitting: *Deleted

## 2014-12-30 ENCOUNTER — Ambulatory Visit: Payer: Medicare Other | Admitting: Physical Therapy

## 2014-12-30 ENCOUNTER — Encounter: Payer: Self-pay | Admitting: Physical Therapy

## 2014-12-30 DIAGNOSIS — I428 Other cardiomyopathies: Secondary | ICD-10-CM

## 2014-12-30 DIAGNOSIS — Z89612 Acquired absence of left leg above knee: Secondary | ICD-10-CM | POA: Diagnosis not present

## 2014-12-30 DIAGNOSIS — R531 Weakness: Secondary | ICD-10-CM

## 2014-12-30 DIAGNOSIS — R6889 Other general symptoms and signs: Secondary | ICD-10-CM | POA: Diagnosis not present

## 2014-12-30 DIAGNOSIS — R269 Unspecified abnormalities of gait and mobility: Secondary | ICD-10-CM | POA: Diagnosis not present

## 2014-12-30 DIAGNOSIS — R2681 Unsteadiness on feet: Secondary | ICD-10-CM

## 2014-12-30 DIAGNOSIS — I82409 Acute embolism and thrombosis of unspecified deep veins of unspecified lower extremity: Secondary | ICD-10-CM

## 2014-12-30 DIAGNOSIS — R2689 Other abnormalities of gait and mobility: Secondary | ICD-10-CM

## 2014-12-30 DIAGNOSIS — R29818 Other symptoms and signs involving the nervous system: Secondary | ICD-10-CM | POA: Diagnosis not present

## 2014-12-30 NOTE — Therapy (Signed)
[facility] [address] [place], [place], [address] Phone: [phone]   Fax:  [phone]  Physical Therapy Treatment  Patient Details  Name: Scott [name] MRN: [id number] Date of Birth: 1921/07/11 Referring Provider:  Chipper Herb, MD  Encounter Date: 12/30/2014      PT End of Session - 12/30/14 1535    Visit Number 5   Number of Visits 18   Date for PT Re-Evaluation 02/14/15   Authorization Type Medicare Do G-code every 10th visit   PT Start Time 1531   PT Stop Time 1613   PT Time Calculation (min) 42 min   Equipment Utilized During Treatment Gait belt   Activity Tolerance Patient tolerated treatment well   Behavior During Therapy [facility] for tasks assessed/performed      Past Medical History  Diagnosis Date  . HTN (hypertension)   . Hyperlipidemia   . PVCs (premature ventricular contractions)   . Glaucoma   . PVD (peripheral vascular disease)     Right ABI .75, Left .78 (2006)  . Popliteal artery aneurysm, bilateral DECUMENTED CHRONIC PARTIAL OCCLUSION--  PT DENIES CLAUDICATION OR ANY OTHER SYMPTOMS  . Frequency of urination   . Nocturia   . DVT, bilateral lower limbs 03/2013    a. Bilat DVT dx 03/2013.  . Pulmonary embolism     a. By CT angio 04/2013.  Marland Kitchen Elevated troponin     a. Adm 12/30-04/2013 - not felt to represent ACS; ?due to PE. b. Patent cors 06/2012.  Marland Kitchen NSVT (nonsustained ventricular tachycardia)     a. NSVT 06/2012; NSVT also seen during 03/2013 adm. b. Med rx. Not candidate for ICD given adv age.  Marland Kitchen ETOH abuse   . Syncope     a. Felt to be postural syncope related to diuretics 06/2012.  Marland Kitchen Heart murmur   . DVT (deep venous thrombosis)     "I think in both legs"  . Pneumonia     "couple times"  . DDD (degenerative disc disease)   . Arthritis     "all over"  . Depression   . CAD- non obstructive disease by cath 3/14   . PAD (peripheral artery disease)   . Gangrene of toe 10/03/2014  .  Sepsis due to other etiology 10/03/2014  . Chronic systolic heart failure 2/58/52    EF improved 50-55%  . Chronic systolic CHF (congestive heart failure)     a. NICM - patent cors 06/2012, EF 40% at that time. b. 03/2013 eval: EF 20-25%.  . Chronic lower back pain   . Bladder cancer 03/15/2012    pt denies this hx on 10/15/2014    Past Surgical History  Procedure Laterality Date  . Transthoracic echocardiogram  09-22-2010    MODERATE LVH/ EF 77%/ GRADE I DIASTOLIC DYSFUNCTION/ AORTIC SCLEROSIS WITHOUT STENOSIS/  RV  SYSTOLIC  MILDLY REDUCED FUNCTION  . Cardiovascular stress test  10-15-2010    LOW RISK NUCLEAR STUDY/ NO EVIDENCE OF ISCHEMIA/ NORMAL EF  . Shoulder surgery  1970's    "don't remember which side or what kind of OR"  . Transurethral resection of bladder tumor  10/07/2011    Procedure: TRANSURETHRAL RESECTION OF BLADDER TUMOR (TURBT);  Surgeon: Malka So, MD;  Location: [facility];  Service: Urology;  Laterality: N/A;  . Cystoscopy  10/07/2011    Procedure: CYSTOSCOPY;  Surgeon: Malka So, MD;  Location: [facility];  Service: Urology;  Laterality: N/A;  .  Cystoscopy with biopsy  03/14/2012    Procedure: CYSTOSCOPY WITH BIOPSY;  Surgeon: Malka So, MD;  Location: [facility];  Service: Urology;  Laterality: N/A;  WITH FULGURATION  . Transurethral resection of prostate  03/14/2012    Procedure: TRANSURETHRAL RESECTION OF THE PROSTATE WITH GYRUS INSTRUMENTS;  Surgeon: Malka So, MD;  Location: [facility];  Service: Urology;  Laterality: N/A;  . Tonsillectomy    . Cataract extraction w/ intraocular lens  implant, bilateral Bilateral   . Cardiac catheterization  05-11-2004  DR Maben PLAQUE/ NORMAL LVF/ EF 55%/  NON-OBSTRUCTIVE LAD 25%  . Cardiac catheterization  06/2012  . Eye surgery    . Amputation Left 10/16/2014    Procedure: AMPUTATION ABOVE KNEE- LEFT;  Surgeon: Mal Misty, MD;  Location: [facility];  Service: Vascular;   Laterality: Left;    There were no vitals filed for this visit.  Visit Diagnosis:  Functional weakness  Abnormality of gait  Decreased functional activity tolerance  Unsteadiness  Balance problems      Subjective Assessment - 12/30/14 1535    Subjective No new complaints. Car transfer's are going well. No pain or falls to report.    Currently in Pain? No/denies   Pain Score 0-No pain            [facility] Adult PT Treatment/Exercise - 12/30/14 1536    Transfers   Sit to Stand 5: Supervision;From elevated surface;With upper extremity assist;With armrests;From chair/3-in-1   Stand to Sit 5: Supervision;With upper extremity assist;With armrests;To chair/3-in-1   Ambulation/Gait   Ambulation/Gait Yes   Ambulation/Gait Assistance 4: Min assist   Ambulation/Gait Assistance Details cues on posture, walker position and to correct gait deviations   Ambulation Distance (Feet) 65 Feet  x1, 70 feet x 1   Assistive device Prosthesis;Rolling walker   Gait Pattern Step-to pattern;Decreased step length - right;Decreased stance time - left;Decreased stride length;Decreased hip/knee flexion - left;Decreased weight shift to left;Left hip hike;Left circumduction;Trunk flexed;Abducted - left;Poor foot clearance - left   Ambulation Surface Level;Indoor   Stairs Yes   Stairs Assistance 4: Min assist   Stairs Assistance Details (indicate cue type and reason) min cues on sequence and technique   Stair Management Technique Two rails;Step to pattern;Forwards   Number of Stairs 4   Door Management --   Ramp 4: Min assist  2 people for safety with walker/prosthesis   Ramp Details (indicate cue type and reason) demo'd with cues prior to pt performance;cues for sequence and technique during along with assist for walker management and knee stability   Curb 4: Min assist  2 people for safety with walker/prosthesis   Curb Details (indicate cue type and reason) demo'd before pt practice with cues;cues  through out for sequence, technique and foot placement with walker advancement.   Prosthetics   Prosthetic Care Comments  Educated pt on reasoning on why we have him take a break in wear times to protect skin integrity and slowly build skin tolerance to liner. Pt verbalized understanding and will add break in now. Pt has not recieved his socks from the prosthetist. Instructed him and caregiver to bring them in when he does so we can show him how to use/don them.                                Current prosthetic wear tolerance (days/week)  daily   Current  prosthetic wear tolerance (#hours/day)  wearing 6 hours straight   Residual limb condition  skin intact   Education Provided Residual limb care;Proper wear schedule/adjustment;Proper weight-bearing schedule/adjustment   Person(s) Educated Patient;Caregiver(s)   Education Method Explanation;Verbal cues   Education Method Verbalized understanding;Needs further instruction   Donning Prosthesis Minimal assist   Doffing Prosthesis Supervision           PT Short Term Goals - 12/17/14 1445    PT SHORT TERM GOAL #1   Title Patient donnes prosthesis correctly modified independent. (Target Date: 01/15/2015)   Time 1   Period Months   Status New   PT SHORT TERM GOAL #2   Title Patient tolerates wear >10hrs total per day without skin issues or limb pain.  (Target Date: 01/15/2015)   Time 1   Period Months   Status New   PT SHORT TERM GOAL #3   Title Patient performs standing balance with RW & prosthesis reaching 10", to floor and manages pants for toileting modified inependent.  (Target Date: 01/15/2015)   Time 1   Period Months   Status New   PT SHORT TERM GOAL #4   Title Patient ambulates 200' with RW & prosthesis with SBA.  (Target Date: 01/15/2015)   Time 1   Period Months   Status New   PT SHORT TERM GOAL #5   Title Patient negotiates stairs (2 rails), ramps & curbs with RW & prosthesis with Minimal assist.  (Target Date: 01/15/2015)    Time 1   Period Months   Status New           PT Long Term Goals - 12/17/14 1445    PT LONG TERM GOAL #1   Title Patient verbalizes proper prosthetic care.  (Target Date: 02/14/2015)   Time 2   Period Months   Status New   PT LONG TERM GOAL #2   Title Patient tolerates wear of prosthesis >80% of awake hours without skin issues or limb pain.  (Target Date: 02/14/2015)   Time 2   Period Months   Status New   PT LONG TERM GOAL #3   Title Berg Balance >25/56  (Target Date: 02/14/2015)   Time 2   Period Months   Status New   PT LONG TERM GOAL #4   Title Patient ambulates 300' with LRAD & prosthesis modified independent to enable community mobility.  (Target Date: 02/14/2015)   Time 2   Period Months   Status New   PT LONG TERM GOAL #5   Title Patient negotiates stairs, ramps & curbs with LRAD & prosthesis modified independent to enable community mobility.  (Target Date: 02/14/2015)   Time 2   Period Months   Status New   Additional Long Term Goals   Additional Long Term Goals Yes   PT LONG TERM GOAL #6   Title Patient ambulates household around furniture with LRAD modified independent.  (Target Date: 02/14/2015)   Time 2   Period Months   Status New          Plan - 12/30/14 1536    Clinical Impression Statement Intiated ramps and curbs today without any issues reported. Pt making steady progress toward goals.   Pt will benefit from skilled therapeutic intervention in order to improve on the following deficits Abnormal gait;Decreased activity tolerance;Decreased balance;Decreased endurance;Decreased knowledge of use of DME;Decreased mobility;Decreased range of motion;Decreased skin integrity;Decreased strength;Postural dysfunction;Prosthetic Dependency   Rehab Potential Good   PT Frequency 2x / week  PT Duration Other (comment)  9 weeks (60 days)   PT Treatment/Interventions ADLs/Self Care Home Management;DME Instruction;Gait training;Stair training;Functional mobility  training;Therapeutic activities;Therapeutic exercise;Balance training;Neuromuscular re-education;Patient/family education;Prosthetic Training;Manual techniques   PT Next Visit Plan instruct in adjusting ply socks, gait with RW & prosthesis, review stairs, ramps & curbs   Consulted and Agree with Plan of Care Patient;Family member/caregiver   Family Member Consulted aid        Problem List Patient Active Problem List   Diagnosis Date Noted  . Status post above knee amputation of left lower extremity 10/18/2014  . Tachyarrhythmia 10/05/2014  . Aspiration pneumonia 10/05/2014  . UTI (lower urinary tract infection) 10/03/2014  . Sepsis due to other etiology 10/03/2014  . Gangrene of toe 10/03/2014  . Ischemic ulcer of left foot 10/03/2014  . Atherosclerotic PVD with ulceration 10/03/2014  . Warfarin-induced coagulopathy 10/03/2014  . Atrial fibrillation with RVR 10/03/2014  . Acute kidney injury 10/03/2014  . Hyperkalemia 10/03/2014  . Cellulitis of left leg 10/03/2014  . PAD (peripheral artery disease) 09/24/2014  . NSTEMI (non-ST elevated myocardial infarction) 10/09/2013  . High risk medication use 05/17/2013  . BPH (benign prostatic hyperplasia) 05/17/2013  . Vitamin D deficiency 05/17/2013  . History of pulmonary embolism (2014) 04/17/2013  . NSVT (nonsustained ventricular tachycardia)- not an ICD candidate 04/17/2013  . NICM (nonischemic cardiomyopathy)- EF 20-25% by Echo 04/12/13 04/17/2013  . Chronic diastolic heart failure, NYHA class 1 03/15/2013  . DVT of lower extremity, bilateral 03/15/2013  . Fatigue 11/02/2012  . Constipation 11/02/2012  . Alcohol abuse 08/27/2012  . Syncope 06/21/2012  . Bladder cancer 03/15/2012  . Benign localized hyperplasia of prostate with urinary obstruction 03/15/2012  . Benign essential HTN   . Hyperlipidemia   . PVCs (premature ventricular contractions)   . CAD- non obstructive disease by cath 3/14     Willow Ora 12/30/2014, 4:17  PM  Willow Ora, PTA, [facility] [address] [place], [place] [address] [phone] 12/30/2014, 4:18 PM

## 2014-12-30 NOTE — Progress Notes (Signed)
DNR scanned to chart  

## 2014-12-31 ENCOUNTER — Encounter: Payer: Medicare Other | Admitting: Physical Therapy

## 2015-01-01 ENCOUNTER — Ambulatory Visit: Payer: Medicare Other | Admitting: Physical Therapy

## 2015-01-03 ENCOUNTER — Encounter: Payer: Self-pay | Admitting: Physical Therapy

## 2015-01-03 ENCOUNTER — Ambulatory Visit: Payer: Medicare Other | Admitting: Physical Therapy

## 2015-01-03 ENCOUNTER — Encounter: Payer: Medicare Other | Admitting: Physical Therapy

## 2015-01-03 DIAGNOSIS — R2681 Unsteadiness on feet: Secondary | ICD-10-CM | POA: Diagnosis not present

## 2015-01-03 DIAGNOSIS — R269 Unspecified abnormalities of gait and mobility: Secondary | ICD-10-CM

## 2015-01-03 DIAGNOSIS — R2689 Other abnormalities of gait and mobility: Secondary | ICD-10-CM

## 2015-01-03 DIAGNOSIS — R6889 Other general symptoms and signs: Secondary | ICD-10-CM | POA: Diagnosis not present

## 2015-01-03 DIAGNOSIS — R531 Weakness: Secondary | ICD-10-CM | POA: Diagnosis not present

## 2015-01-03 DIAGNOSIS — Z89612 Acquired absence of left leg above knee: Secondary | ICD-10-CM | POA: Diagnosis not present

## 2015-01-03 DIAGNOSIS — R29818 Other symptoms and signs involving the nervous system: Secondary | ICD-10-CM | POA: Diagnosis not present

## 2015-01-03 NOTE — Therapy (Signed)
[facility] [address] [place], [place], [address] Phone: [phone]   Fax:  [phone]  Physical Therapy Treatment  Patient Details  Name: Scott [name] MRN: [id number] Date of Birth: [birth year] Referring Provider:  Chipper Herb, MD  Encounter Date: 01/03/2015      PT End of Session - 01/03/15 1458    Visit Number 6   Number of Visits 18   Date for PT Re-Evaluation 02/14/15   Authorization Type Medicare Do G-code every 10th visit   PT Start Time 1448   PT Stop Time 1530   PT Time Calculation (min) 42 min   Equipment Utilized During Treatment Gait belt   Activity Tolerance Patient tolerated treatment well   Behavior During Therapy [facility] for tasks assessed/performed      Past Medical History  Diagnosis Date  . HTN (hypertension)   . Hyperlipidemia   . PVCs (premature ventricular contractions)   . Glaucoma   . PVD (peripheral vascular disease)     Right ABI .75, Left .78 (2006)  . Popliteal artery aneurysm, bilateral DECUMENTED CHRONIC PARTIAL OCCLUSION--  PT DENIES CLAUDICATION OR ANY OTHER SYMPTOMS  . Frequency of urination   . Nocturia   . DVT, bilateral lower limbs 03/2013    a. Bilat DVT dx 03/2013.  . Pulmonary embolism     a. By CT angio 04/2013.  Marland Kitchen Elevated troponin     a. Adm 12/30-04/2013 - not felt to represent ACS; ?due to PE. b. Patent cors 06/2012.  Marland Kitchen NSVT (nonsustained ventricular tachycardia)     a. NSVT 06/2012; NSVT also seen during 03/2013 adm. b. Med rx. Not candidate for ICD given adv age.  Marland Kitchen ETOH abuse   . Syncope     a. Felt to be postural syncope related to diuretics 06/2012.  Marland Kitchen Heart murmur   . DVT (deep venous thrombosis)     "I think in both legs"  . Pneumonia     "couple times"  . DDD (degenerative disc disease)   . Arthritis     "all over"  . Depression   . CAD- non obstructive disease by cath 3/14   . PAD (peripheral artery disease)   . Gangrene of toe 10/03/2014  .  Sepsis due to other etiology 10/03/2014  . Chronic systolic heart failure 9/35/70    EF improved 50-55%  . Chronic systolic CHF (congestive heart failure)     a. NICM - patent cors 06/2012, EF 40% at that time. b. 03/2013 eval: EF 20-25%.  . Chronic lower back pain   . Bladder cancer 03/15/2012    pt denies this hx on 10/15/2014    Past Surgical History  Procedure Laterality Date  . Transthoracic echocardiogram  09-22-2010    MODERATE LVH/ EF 17%/ GRADE I DIASTOLIC DYSFUNCTION/ AORTIC SCLEROSIS WITHOUT STENOSIS/  RV  SYSTOLIC  MILDLY REDUCED FUNCTION  . Cardiovascular stress test  10-15-2010    LOW RISK NUCLEAR STUDY/ NO EVIDENCE OF ISCHEMIA/ NORMAL EF  . Shoulder surgery  1970's    "don't remember which side or what kind of OR"  . Transurethral resection of bladder tumor  10/07/2011    Procedure: TRANSURETHRAL RESECTION OF BLADDER TUMOR (TURBT);  Surgeon: Malka So, MD;  Location: [facility];  Service: Urology;  Laterality: N/A;  . Cystoscopy  10/07/2011    Procedure: CYSTOSCOPY;  Surgeon: Malka So, MD;  [facility]  Service: Urology;  Laterality: N/A;  .  Cystoscopy with biopsy  03/14/2012    Procedure: CYSTOSCOPY WITH BIOPSY;  Surgeon: Malka So, MD;  Location: [facility];  Service: Urology;  Laterality: N/A;  WITH FULGURATION  . Transurethral resection of prostate  03/14/2012    Procedure: TRANSURETHRAL RESECTION OF THE PROSTATE WITH GYRUS INSTRUMENTS;  Surgeon: Malka So, MD;  Location: [facility];  Service: Urology;  Laterality: N/A;  . Tonsillectomy    . Cataract extraction w/ intraocular lens  implant, bilateral Bilateral   . Cardiac catheterization  05-11-2004  DR Browning PLAQUE/ NORMAL LVF/ EF 55%/  NON-OBSTRUCTIVE LAD 25%  . Cardiac catheterization  06/2012  . Eye surgery    . Amputation Left 10/16/2014    Procedure: AMPUTATION ABOVE KNEE- LEFT;  Surgeon: Mal Misty, MD;  Location: [facility];  Service: Vascular;   Laterality: Left;    There were no vitals filed for this visit.  Visit Diagnosis:  Functional weakness  Abnormality of gait  Decreased functional activity tolerance  Unsteadiness  Balance problems      Subjective Assessment - 01/03/15 1453    Subjective No new compaints. No falls. Does report sorenes on lateral side of  right foot at great toe metatarsal head, that started yesterday. Not open per pt report. Skin checked, no wound noted on this area.   Currently in Pain? No/denies   Pain Score 0-No pain          [facility] Adult PT Treatment/Exercise - 01/03/15 1459    Transfers   Sit to Stand 5: Supervision;From elevated surface;With upper extremity assist;With armrests;From chair/3-in-1   Sit to Stand Details Verbal cues for sequencing;Verbal cues for technique;Verbal cues for safe use of DME/AE   Sit to Stand Details (indicate cue type and reason) cues to scoot forward, for hand placement and anterior weight shift   Stand to Sit 5: Supervision;With upper extremity assist;With armrests;To chair/3-in-1   Stand to Sit Details (indicate cue type and reason) Verbal cues for precautions/safety;Verbal cues for safe use of DME/AE   Stand to Sit Details cues to turn the whole way before sitting down and to use arms to control descent. cues to unlock prosthetic knee prior to sitting down.   Ambulation/Gait   Ambulation/Gait Yes   Ambulation/Gait Assistance 4: Min assist;3: Mod assist   Ambulation/Gait Assistance Details 1 episode of mod assist for balance due to prosthetic knee not locked when pt went to advance right leg. Pt able to assist with balance recovery. Cues on posture and to correct gait deviations.                                 Ambulation Distance (Feet) 120 Feet   Assistive device Prosthesis;Rolling walker   Gait Pattern Step-to pattern;Decreased step length - right;Decreased stance time - left;Decreased stride length;Decreased hip/knee flexion - left;Decreased weight shift  to left;Left hip hike;Left circumduction;Trunk flexed;Abducted - left;Poor foot clearance - left   Ambulation Surface Level;Indoor   Prosthetics   Prosthetic Care Comments  Increased time spent on sock ply management, donning and cleaning. Pt to clinic today with 5 ply sock donned under liner on adivce of his caretaker. Pt educated on proper donning and use of socks. Will need reinforcment on ply sock adjustment.  Current prosthetic wear tolerance (days/week)  daily   Current prosthetic wear tolerance (#hours/day)  6 hours with break midway   Residual limb condition  skin intact per pt report   Education Provided Residual limb care;Correct ply sock adjustment;Ply sock cleaning   Person(s) Educated Patient   Education Method Verbal cues;Demonstration;Explanation   Education Method Verbalized understanding;Verbal cues required;Needs further instruction   Donning Prosthesis Minimal assist   Doffing Prosthesis Supervision            PT Short Term Goals - 12/17/14 1445    PT SHORT TERM GOAL #1   Title Patient donnes prosthesis correctly modified independent. (Target Date: 01/15/2015)   Time 1   Period Months   Status New   PT SHORT TERM GOAL #2   Title Patient tolerates wear >10hrs total per day without skin issues or limb pain.  (Target Date: 01/15/2015)   Time 1   Period Months   Status New   PT SHORT TERM GOAL #3   Title Patient performs standing balance with RW & prosthesis reaching 10", to floor and manages pants for toileting modified inependent.  (Target Date: 01/15/2015)   Time 1   Period Months   Status New   PT SHORT TERM GOAL #4   Title Patient ambulates 200' with RW & prosthesis with SBA.  (Target Date: 01/15/2015)   Time 1   Period Months   Status New   PT SHORT TERM GOAL #5   Title Patient negotiates stairs (2 rails), ramps & curbs with RW & prosthesis with Minimal assist.  (Target Date: 01/15/2015)   Time 1   Period Months    Status New           PT Long Term Goals - 12/17/14 1445    PT LONG TERM GOAL #1   Title Patient verbalizes proper prosthetic care.  (Target Date: 02/14/2015)   Time 2   Period Months   Status New   PT LONG TERM GOAL #2   Title Patient tolerates wear of prosthesis >80% of awake hours without skin issues or limb pain.  (Target Date: 02/14/2015)   Time 2   Period Months   Status New   PT LONG TERM GOAL #3   Title Berg Balance >25/56  (Target Date: 02/14/2015)   Time 2   Period Months   Status New   PT LONG TERM GOAL #4   Title Patient ambulates 300' with LRAD & prosthesis modified independent to enable community mobility.  (Target Date: 02/14/2015)   Time 2   Period Months   Status New   PT LONG TERM GOAL #5   Title Patient negotiates stairs, ramps & curbs with LRAD & prosthesis modified independent to enable community mobility.  (Target Date: 02/14/2015)   Time 2   Period Months   Status New   Additional Long Term Goals   Additional Long Term Goals Yes   PT LONG TERM GOAL #6   Title Patient ambulates household around furniture with LRAD modified independent.  (Target Date: 02/14/2015)   Time 2   Period Months   Status New           Plan - 01/03/15 1458    Clinical Impression Statement Initiated sock education today, pt will need reinforcement. Pt with increased gait distance today. Progressing well towards goals.   Pt will benefit from skilled therapeutic intervention in order to improve on the following deficits Abnormal gait;Decreased activity tolerance;Decreased balance;Decreased endurance;Decreased knowledge of use of DME;Decreased  mobility;Decreased range of motion;Decreased skin integrity;Decreased strength;Postural dysfunction;Prosthetic Dependency   Rehab Potential Good   PT Frequency 2x / week   PT Duration Other (comment)  9 weeks (60 days)   PT Treatment/Interventions ADLs/Self Care Home Management;DME Instruction;Gait training;Stair training;Functional  mobility training;Therapeutic activities;Therapeutic exercise;Balance training;Neuromuscular re-education;Patient/family education;Prosthetic Training;Manual techniques   PT Next Visit Plan  gait with RW & prosthesis, review stairs, ramps & curbs   Consulted and Agree with Plan of Care Patient;Family member/caregiver   Family Member Consulted aid        Problem List Patient Active Problem List   Diagnosis Date Noted  . Status post above knee amputation of left lower extremity 10/18/2014  . Tachyarrhythmia 10/05/2014  . Aspiration pneumonia 10/05/2014  . UTI (lower urinary tract infection) 10/03/2014  . Sepsis due to other etiology 10/03/2014  . Gangrene of toe 10/03/2014  . Ischemic ulcer of left foot 10/03/2014  . Atherosclerotic PVD with ulceration 10/03/2014  . Warfarin-induced coagulopathy 10/03/2014  . Atrial fibrillation with RVR 10/03/2014  . Acute kidney injury 10/03/2014  . Hyperkalemia 10/03/2014  . Cellulitis of left leg 10/03/2014  . PAD (peripheral artery disease) 09/24/2014  . NSTEMI (non-ST elevated myocardial infarction) 10/09/2013  . High risk medication use 05/17/2013  . BPH (benign prostatic hyperplasia) 05/17/2013  . Vitamin D deficiency 05/17/2013  . History of pulmonary embolism (2014) 04/17/2013  . NSVT (nonsustained ventricular tachycardia)- not an ICD candidate 04/17/2013  . NICM (nonischemic cardiomyopathy)- EF 20-25% by Echo 04/12/13 04/17/2013  . Chronic diastolic heart failure, NYHA class 1 03/15/2013  . DVT of lower extremity, bilateral 03/15/2013  . Fatigue 11/02/2012  . Constipation 11/02/2012  . Alcohol abuse 08/27/2012  . Syncope 06/21/2012  . Bladder cancer 03/15/2012  . Benign localized hyperplasia of prostate with urinary obstruction 03/15/2012  . Benign essential HTN   . Hyperlipidemia   . PVCs (premature ventricular contractions)   . CAD- non obstructive disease by cath 3/14     Willow Ora 01/03/2015, 4:20 PM  Willow Ora, PTA,  [facility] [address] [place], [place] [address] [phone] 01/03/2015, 4:20 PM

## 2015-01-05 IMAGING — CR DG CHEST 2V
2 series · 2 of 2 positions shown · non-contrast
Comparison: 10/08/2013

CLINICAL DATA: Yearly exam. History of atrial fibrillation. History
of bilateral lower extremity DVT. Labored breathing, shortness of
Breath.

EXAM:
CHEST  2 VIEW

[view not recorded (1 of 2)]
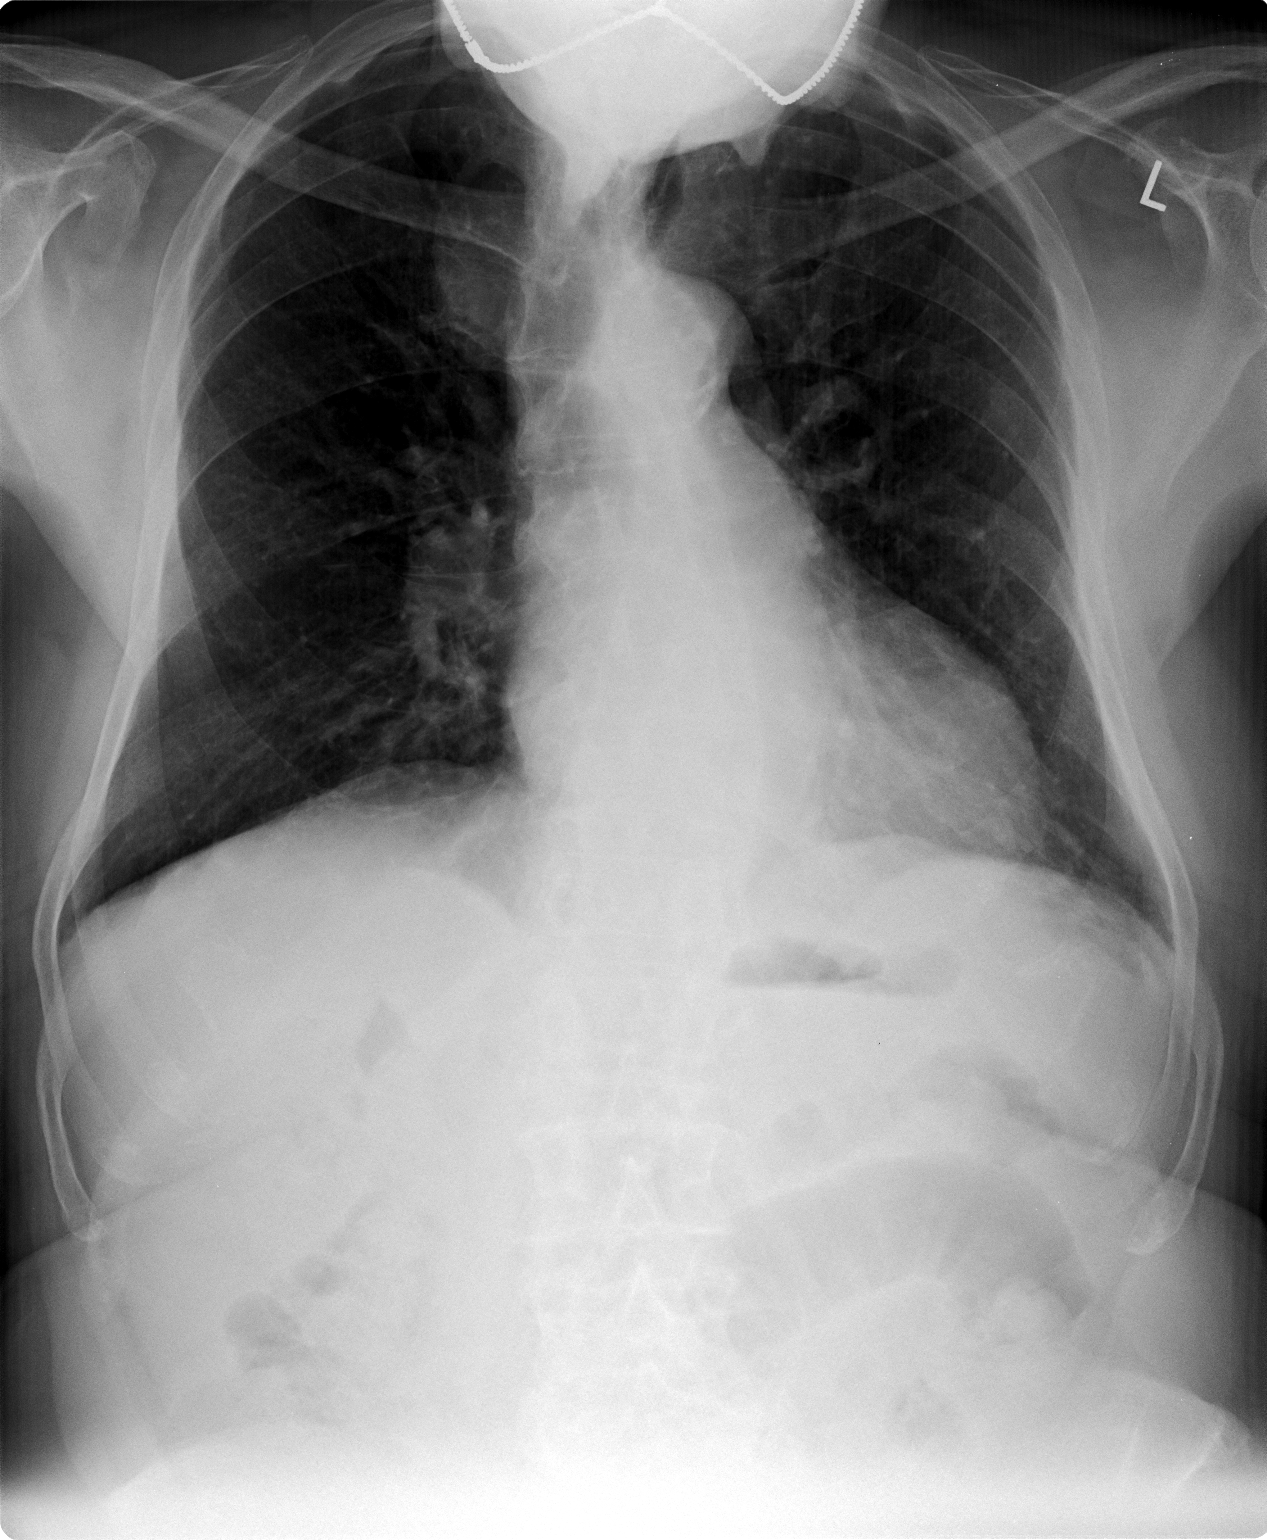

[view not recorded (2 of 2)]
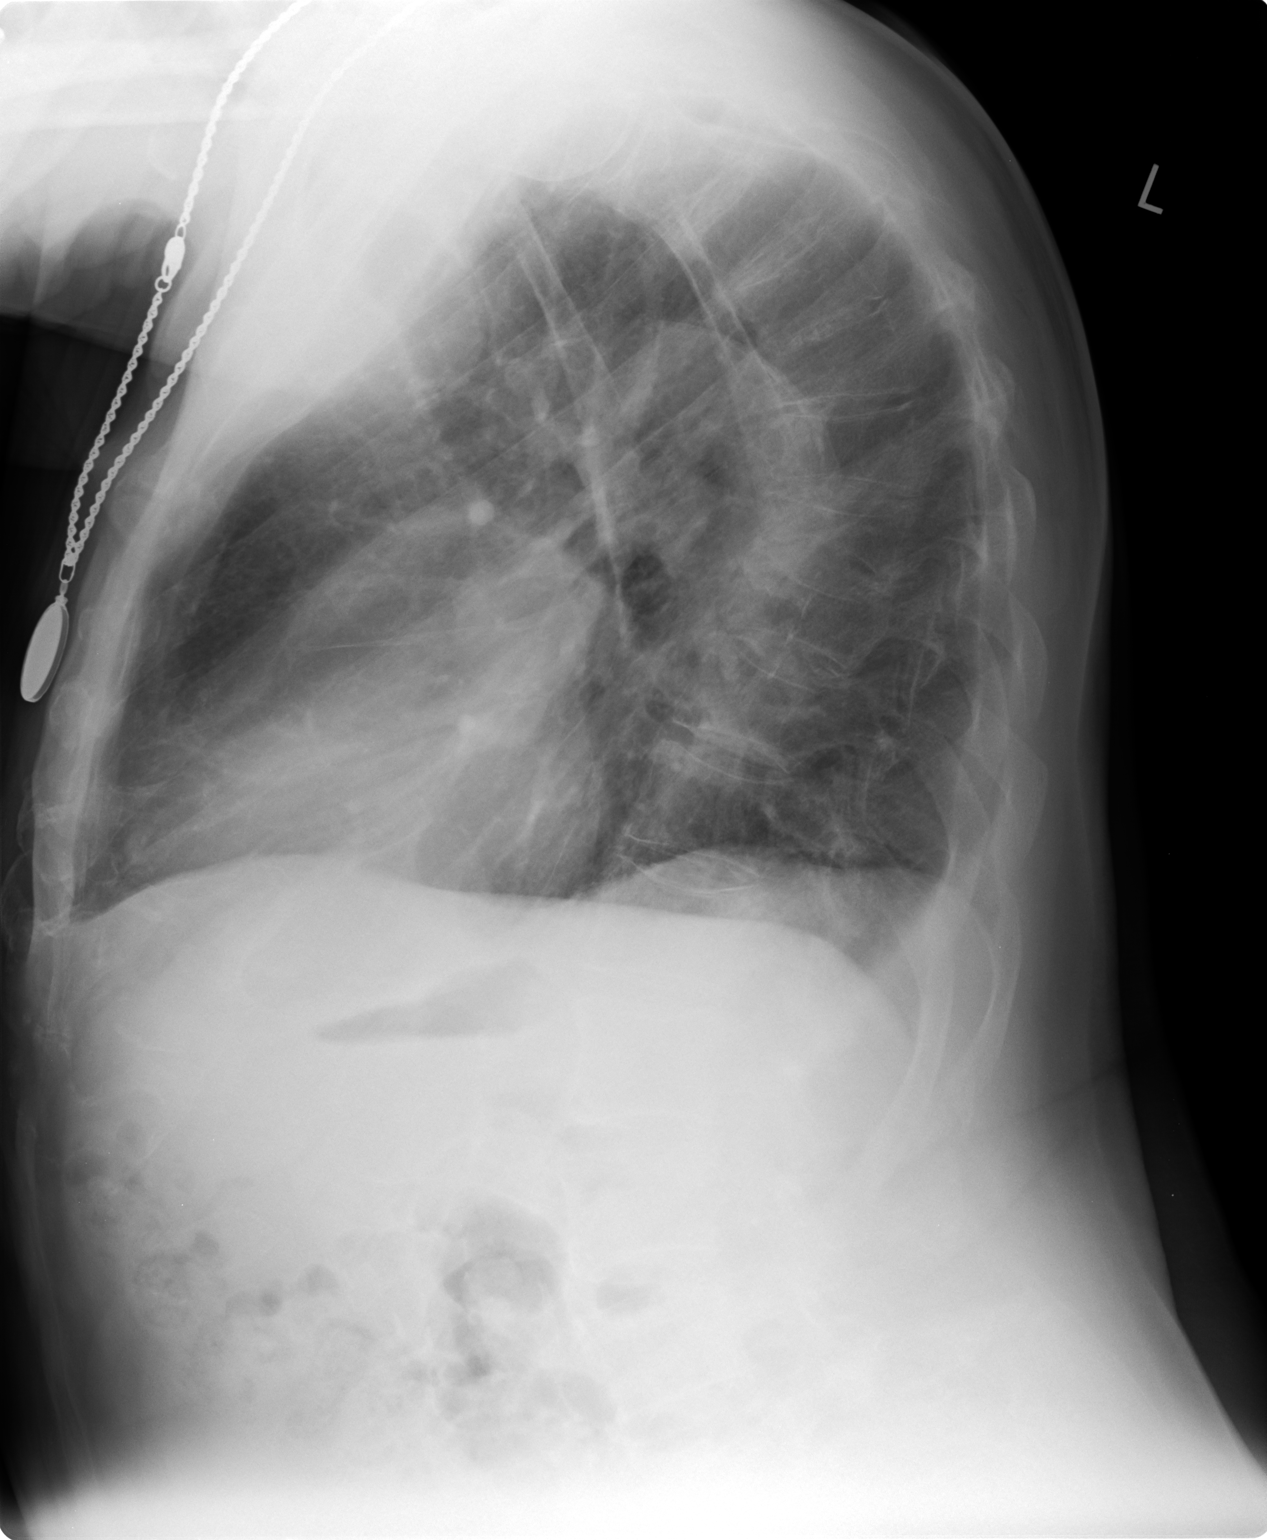

[2 of 2 positions shown; findings below may reference images not displayed]

FINDINGS: Heart is normal size. Mediastinal contours are within normal limits.
Soft tissue fullness in the right paratracheal region is shown on
prior CT to represent tortuous vasculature. No confluent airspace
opacity. No effusion. No acute bony abnormality. Degenerative
changes in the thoracic spine. Mild to moderate compression
fractures in the mid thoracic spine, progressed since prior CT
IMPRESSION: No active cardiopulmonary disease.

Mild compression fractures in the mid thoracic spine, progressed
since 1 year ago.

## 2015-01-05 IMAGING — CR DG LUMBAR SPINE 2-3V
3 series · 3 of 3 positions shown · non-contrast
Comparison: MR lumbar spine of 01/20/2011

CLINICAL DATA: Low back pain, history of prostate carcinoma

EXAM:
LUMBAR SPINE - 2-3 VIEW

[view not recorded (1 of 3)]
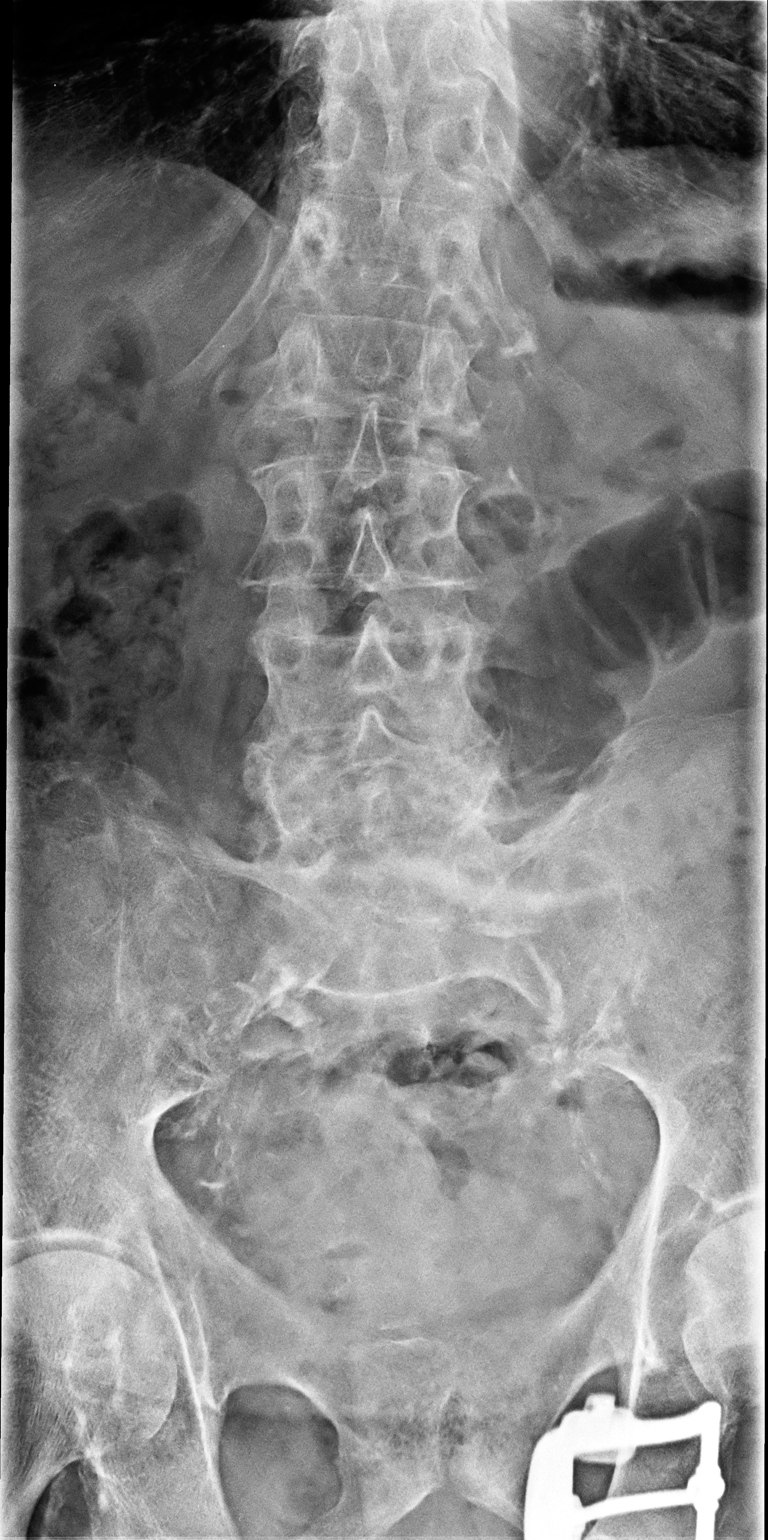

[view not recorded (2 of 3)]
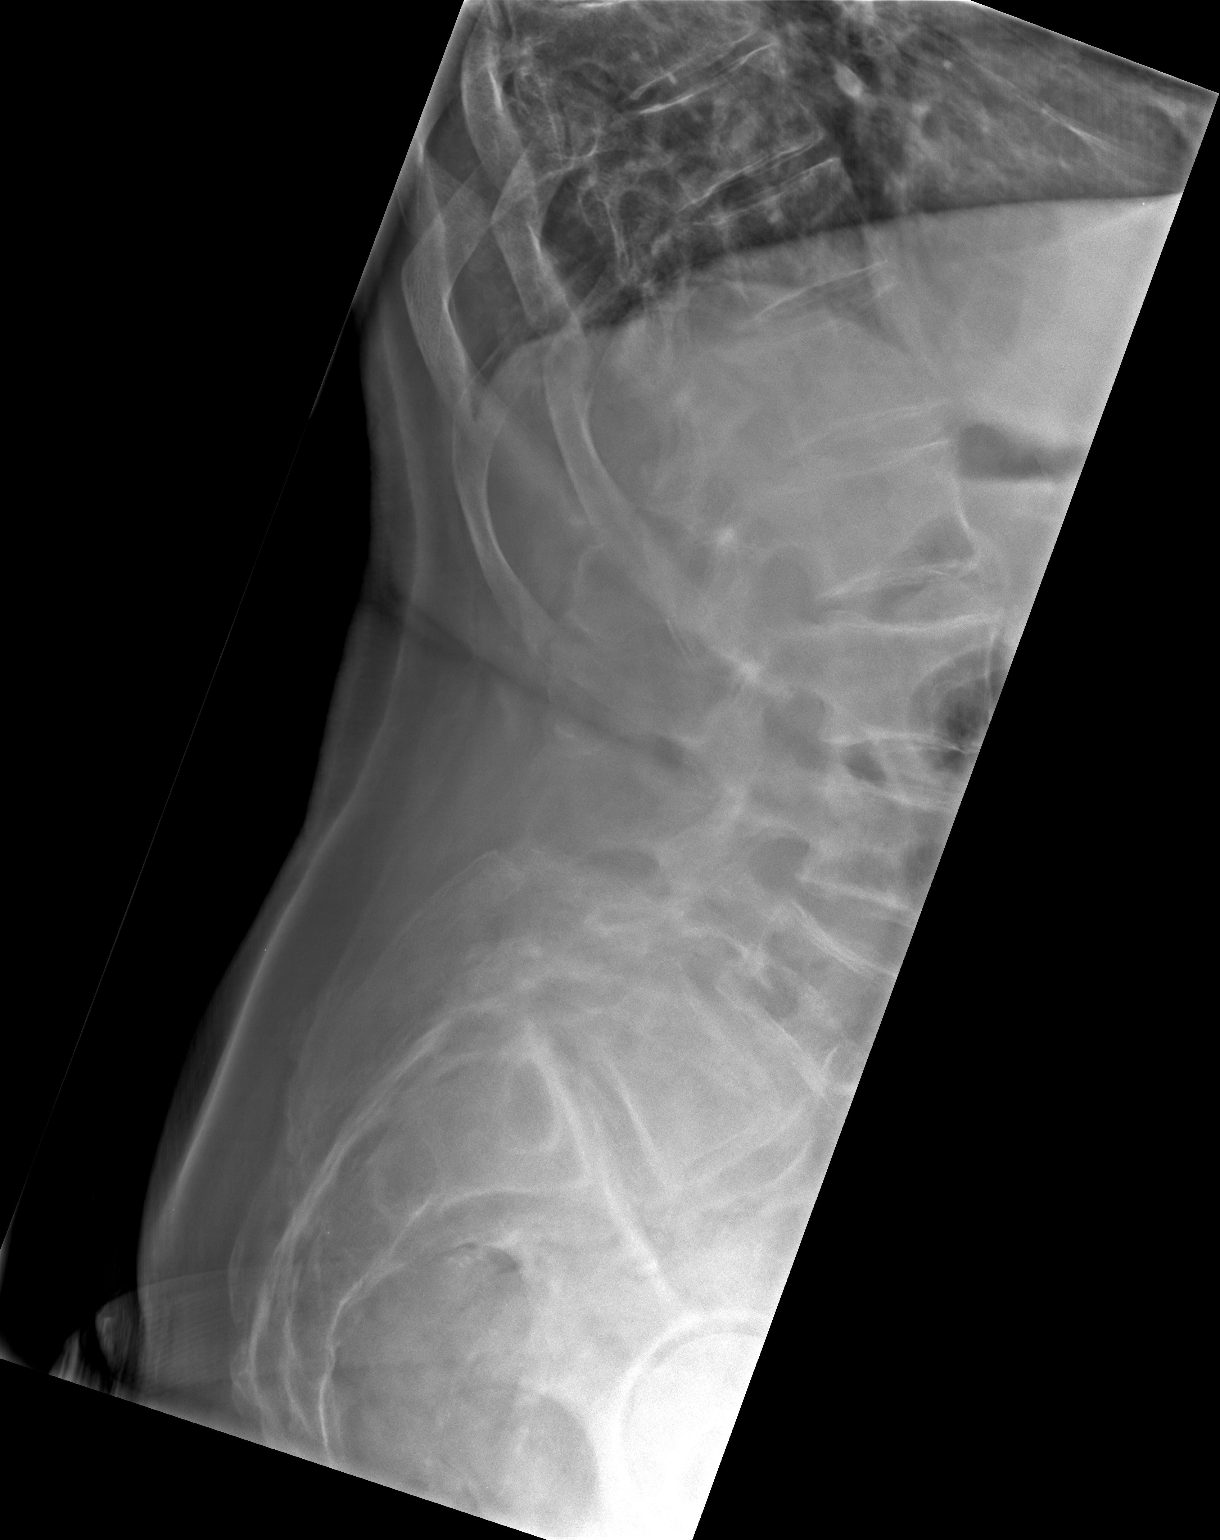

[view not recorded (3 of 3)]
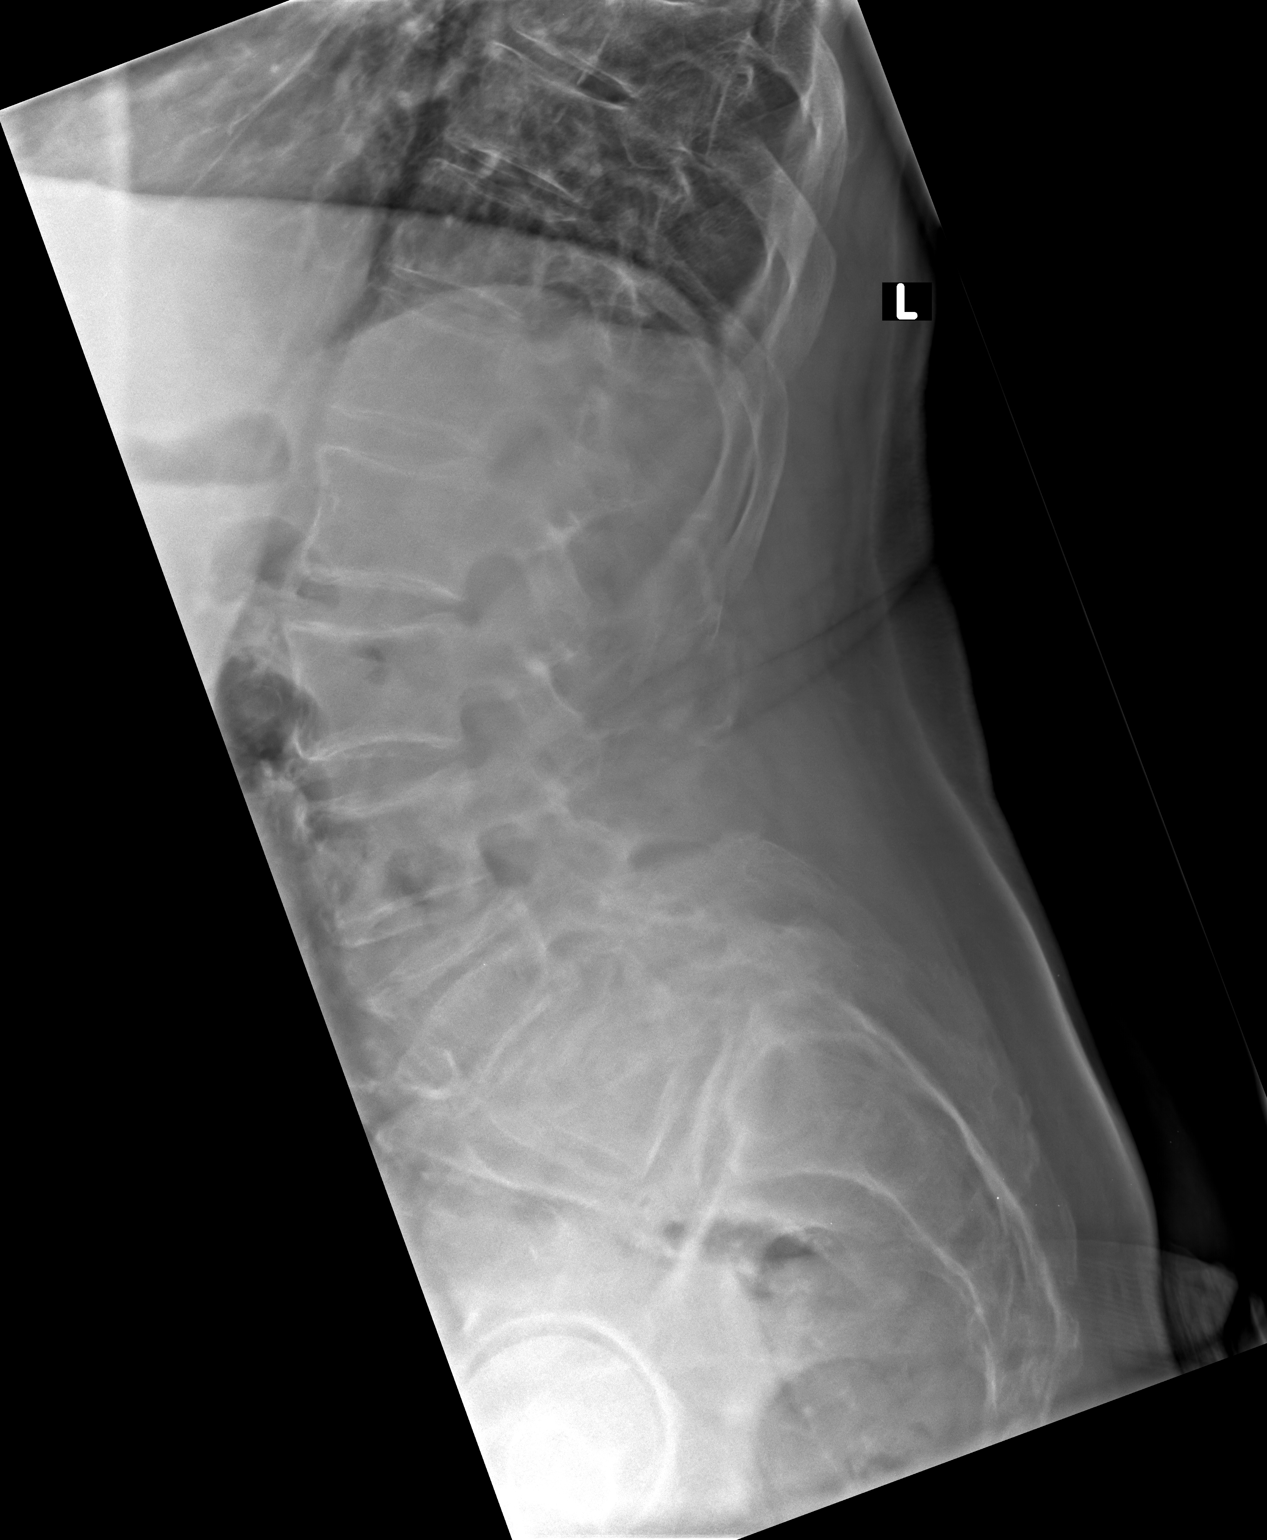

[3 of 3 positions shown; findings below may reference images not displayed]

FINDINGS: There is a mild curvature of the lumbar spine convex to the right by
9 degrees. There is anterolisthesis of L4 on L5 by 11 mm which
appears relatively stable compared to the prior MRI. Degenerative
disc disease is again noted at L4-5. The remainder of the
intervertebral disc spaces are unchanged.
IMPRESSION: 1. 11 mm anterolisthesis of L4 on L5 with degenerative disc disease
at L4-5.
2. Mild lumbar curvature convex the right by 9 degrees.

## 2015-01-06 ENCOUNTER — Ambulatory Visit: Payer: Medicare Other | Admitting: Physical Therapy

## 2015-01-06 ENCOUNTER — Telehealth: Payer: Self-pay | Admitting: Family Medicine

## 2015-01-06 NOTE — Telephone Encounter (Signed)
No appts available for today. Appt given for tomorrow per patient request

## 2015-01-07 ENCOUNTER — Encounter: Payer: Self-pay | Admitting: Family Medicine

## 2015-01-07 ENCOUNTER — Ambulatory Visit (INDEPENDENT_AMBULATORY_CARE_PROVIDER_SITE_OTHER): Payer: Medicare Other | Admitting: Family Medicine

## 2015-01-07 ENCOUNTER — Telehealth: Payer: Self-pay | Admitting: Family Medicine

## 2015-01-07 VITALS — BP 136/67 | HR 67 | Temp 99.4°F | Ht 67.0 in

## 2015-01-07 DIAGNOSIS — R300 Dysuria: Secondary | ICD-10-CM

## 2015-01-07 DIAGNOSIS — R609 Edema, unspecified: Secondary | ICD-10-CM | POA: Diagnosis not present

## 2015-01-07 DIAGNOSIS — I70269 Atherosclerosis of native arteries of extremities with gangrene, unspecified extremity: Secondary | ICD-10-CM

## 2015-01-07 DIAGNOSIS — N390 Urinary tract infection, site not specified: Secondary | ICD-10-CM | POA: Diagnosis not present

## 2015-01-07 LAB — POCT URINALYSIS DIPSTICK
Bilirubin, UA: NEGATIVE
Glucose, UA: NEGATIVE
Ketones, UA: NEGATIVE
Leukocytes, UA: NEGATIVE
Nitrite, UA: NEGATIVE
Protein, UA: NEGATIVE
Spec Grav, UA: 1.02
Urobilinogen, UA: NEGATIVE
pH, UA: 6

## 2015-01-07 MED ORDER — OXYCODONE HCL 5 MG PO TABS: 2.5000 mg | ORAL_TABLET | Freq: Four times a day (QID) | ORAL | Status: DC | PRN

## 2015-01-07 NOTE — Telephone Encounter (Signed)
Last filled on 8/23- wants refill. If approved it will print.

## 2015-01-07 NOTE — Progress Notes (Signed)
Subjective:    Patient ID: Scott [name], male    DOB: [birth year], 81 y.o.   MRN: [id number]  HPI Patient is here for right foot pain which is also swollen and red.   Patient has a history of vascular disease and has had swelling in his feet and ankles secondary to that. He tells me that the left foot which was recently amputated with a AKA started the same way. He denies pain.     Patient Active Problem List   Diagnosis Date Noted  . Status post above knee amputation of left lower extremity 10/18/2014  . Tachyarrhythmia 10/05/2014  . Aspiration pneumonia 10/05/2014  . UTI (lower urinary tract infection) 10/03/2014  . Sepsis due to other etiology 10/03/2014  . Gangrene of toe 10/03/2014  . Ischemic ulcer of left foot 10/03/2014  . Atherosclerotic PVD with ulceration 10/03/2014  . Warfarin-induced coagulopathy 10/03/2014  . Atrial fibrillation with RVR 10/03/2014  . Acute kidney injury 10/03/2014  . Hyperkalemia 10/03/2014  . Cellulitis of left leg 10/03/2014  . PAD (peripheral artery disease) 09/24/2014  . NSTEMI (non-ST elevated myocardial infarction) 10/09/2013  . High risk medication use 05/17/2013  . BPH (benign prostatic hyperplasia) 05/17/2013  . Vitamin D deficiency 05/17/2013  . History of pulmonary embolism (2014) 04/17/2013  . NSVT (nonsustained ventricular tachycardia)- not an ICD candidate 04/17/2013  . NICM (nonischemic cardiomyopathy)- EF 20-25% by Echo 04/12/13 04/17/2013  . Chronic diastolic heart failure, NYHA class 1 03/15/2013  . DVT of lower extremity, bilateral 03/15/2013  . Fatigue 11/02/2012  . Constipation 11/02/2012  . Alcohol abuse 08/27/2012  . Syncope 06/21/2012  . Bladder cancer 03/15/2012  . Benign localized hyperplasia of prostate with urinary obstruction 03/15/2012  . Benign essential HTN   . Hyperlipidemia   . PVCs (premature ventricular contractions)   . CAD- non obstructive disease by cath 3/14    Outpatient Encounter Prescriptions as of  01/07/2015  Medication Sig  . alum & mag hydroxide-simeth (MAALOX/MYLANTA) 200-200-20 MG/5ML suspension Take 30 mLs by mouth every 6 (six) hours as needed for indigestion or heartburn (dyspepsia).  . bisacodyl (DULCOLAX) 10 MG suppository Place 1 suppository (10 mg total) rectally daily as needed for moderate constipation.  Marland Kitchen donepezil (ARICEPT) 10 MG tablet TAKE ONE TABLET AT BEDTIME  . feeding supplement, ENSURE ENLIVE, (ENSURE ENLIVE) LIQD Take 237 mLs by mouth 2 (two) times daily between meals.  . ferrous sulfate (FERROUSUL) 325 (65 FE) MG tablet Take 1 tablet (325 mg total) by mouth daily with breakfast.  . fluticasone (FLONASE) 50 MCG/ACT nasal spray Place 2 sprays into both nostrils daily.  . folic acid (FOLVITE) 1 MG tablet Take 1 tablet (1 mg total) by mouth daily.  . furosemide (LASIX) 20 MG tablet Take 1 tablet (20 mg total) by mouth daily.  Marland Kitchen gabapentin (NEURONTIN) 100 MG capsule TAKE (2) CAPSULES TWICE DAILY.  . hydrocerin (EUCERIN) CREA Apply 1 application topically 2 (two) times daily.  . isosorbide mononitrate (IMDUR) 30 MG 24 hr tablet Take 0.5 tablets (15 mg total) by mouth daily. (Patient taking differently: Take 30 mg by mouth daily. )  . metoprolol succinate (TOPROL-XL) 50 MG 24 hr tablet Take 1 tablet (50 mg total) by mouth daily. Take with or immediately following a meal.  . nitroGLYCERIN (NITROSTAT) 0.4 MG SL tablet Place 1 tablet (0.4 mg total) under the tongue every 5 (five) minutes as needed for chest pain (up to 3 doses).  Marland Kitchen oxyCODONE (OXY IR/ROXICODONE) 5 MG immediate release  tablet Take 0.5 tablets (2.5 mg total) by mouth every 6 (six) hours as needed for moderate pain.  . pantoprazole (PROTONIX) 40 MG tablet Take 1 tablet (40 mg total) by mouth daily.  . simvastatin (ZOCOR) 10 MG tablet TAKE 1 TABLET DAILY  . tamsulosin (FLOMAX) 0.4 MG CAPS capsule TAKE (1) CAPSULE DAILY  . thiamine 100 MG tablet Take 1 tablet (100 mg total) by mouth daily.  . traZODone (DESYREL) 50  MG tablet Take 0.5-1 tablets (25-50 mg total) by mouth at bedtime as needed for sleep.  Marland Kitchen ULORIC 40 MG tablet TAKE 1 TABLET DAILY  . warfarin (COUMADIN) 5 MG tablet Take 1 tablet (5 mg total) by mouth daily at 6 PM. (Patient taking differently: Take 4.5 mg by mouth daily at 6 PM. )  . doxycycline (VIBRA-TABS) 100 MG tablet Take 1 tablet (100 mg total) by mouth 2 (two) times daily. (Patient not taking: Reported on 01/07/2015)   No facility-administered encounter medications on file as of 01/07/2015.        Review of Systems  Constitutional: Negative.   HENT: Negative.   Eyes: Negative.   Respiratory: Negative.   Cardiovascular: Negative.   Gastrointestinal: Negative.   Endocrine: Negative.   Genitourinary: Negative.   Musculoskeletal: Negative.   Skin: Negative.        Right foot red, swollen, painful. Also says there is a pressure ulcer on his bottom.  Allergic/Immunologic: Negative.   Neurological: Negative.   Hematological: Negative.   Psychiatric/Behavioral: Negative.        Objective:   Physical Exam  Constitutional: He appears well-developed and well-nourished.  Cardiovascular: Normal rate.   Pulmonary/Chest: Effort normal and breath sounds normal.  Musculoskeletal:  Right foot: There is 3+ swelling. I think any redness is probably due to stasis dermatitis rather than infection because the foot is not warm as it should be if there were soft tissue infection.  Skin:  Since he was just in a nursing home there was some breakdown on the sacral area there is a bandage overlying which is no more than a Band-Aid. This was removed and the skin is completely clear underneath. My suggestion would just be to apply lotion to keep the area moist and in good condition  Psychiatric: He has a normal mood and affect. Thought content normal.    BP 136/67 mmHg  Pulse 67  Temp(Src) 99.4 F (37.4 C) (Oral)  Ht 5\' 7"  (1.702 m)       Assessment & Plan:  1. Dysuria Just finished  course of doxy for UTI; has frequency but also hx of bladder cancer and BPH.  Culture to help clarify sx; has fevere but no respiratory sx - POCT urinalysis dipstick - Urine culture  2. UTI (lower urinary tract infection)   3. Edema Probably related to vascular disease.  i see no evidence of soft tissue infection.  Double lasix, elevate, wear support hose\Re-check oin 1 week  Wardell Honour MD

## 2015-01-07 NOTE — Patient Instructions (Signed)
Wear support hose Follow up in 1 week Increase lasix to 40 mg every morning  Keep foot elevated as much as possible  We will call you with the urine culture results when they are back Please call if symptoms worsen.

## 2015-01-08 ENCOUNTER — Ambulatory Visit: Payer: Medicare Other | Admitting: Physical Therapy

## 2015-01-08 ENCOUNTER — Other Ambulatory Visit: Payer: Self-pay

## 2015-01-08 DIAGNOSIS — R269 Unspecified abnormalities of gait and mobility: Secondary | ICD-10-CM | POA: Diagnosis not present

## 2015-01-08 DIAGNOSIS — R29818 Other symptoms and signs involving the nervous system: Secondary | ICD-10-CM | POA: Diagnosis not present

## 2015-01-08 DIAGNOSIS — R531 Weakness: Secondary | ICD-10-CM

## 2015-01-08 DIAGNOSIS — Z89612 Acquired absence of left leg above knee: Secondary | ICD-10-CM | POA: Diagnosis not present

## 2015-01-08 DIAGNOSIS — R2681 Unsteadiness on feet: Secondary | ICD-10-CM | POA: Diagnosis not present

## 2015-01-08 DIAGNOSIS — R2689 Other abnormalities of gait and mobility: Secondary | ICD-10-CM

## 2015-01-08 DIAGNOSIS — I5022 Chronic systolic (congestive) heart failure: Secondary | ICD-10-CM

## 2015-01-08 DIAGNOSIS — R6889 Other general symptoms and signs: Secondary | ICD-10-CM | POA: Diagnosis not present

## 2015-01-08 MED ORDER — FUROSEMIDE 20 MG PO TABS: 20.0000 mg | ORAL_TABLET | Freq: Every day | ORAL | Status: DC

## 2015-01-09 ENCOUNTER — Telehealth: Payer: Self-pay | Admitting: *Deleted

## 2015-01-09 LAB — URINE CULTURE

## 2015-01-09 NOTE — Therapy (Signed)
[facility] [address] [place], [place], [address] Phone: [phone]   Fax:  [phone]  Physical Therapy Treatment  Patient Details  Name: Scott [name] MRN: [id number] Date of Birth: 1922/03/29 Referring Provider:  Chipper Herb, MD  Encounter Date: 01/08/2015      PT End of Session - 01/08/15 1412    Visit Number 7   Number of Visits 18   Date for PT Re-Evaluation 02/14/15   Authorization Type Medicare Do G-code every 10th visit   PT Start Time 1403   PT Stop Time 1445   PT Time Calculation (min) 42 min   Equipment Utilized During Treatment Gait belt   Activity Tolerance Patient tolerated treatment well   Behavior During Therapy [facility] for tasks assessed/performed      Past Medical History  Diagnosis Date  . HTN (hypertension)   . Hyperlipidemia   . PVCs (premature ventricular contractions)   . Glaucoma   . PVD (peripheral vascular disease)     Right ABI .75, Left .78 (2006)  . Popliteal artery aneurysm, bilateral DECUMENTED CHRONIC PARTIAL OCCLUSION--  PT DENIES CLAUDICATION OR ANY OTHER SYMPTOMS  . Frequency of urination   . Nocturia   . DVT, bilateral lower limbs 03/2013    a. Bilat DVT dx 03/2013.  . Pulmonary embolism     a. By CT angio 04/2013.  Marland Kitchen Elevated troponin     a. Adm 12/30-04/2013 - not felt to represent ACS; ?due to PE. b. Patent cors 06/2012.  Marland Kitchen NSVT (nonsustained ventricular tachycardia)     a. NSVT 06/2012; NSVT also seen during 03/2013 adm. b. Med rx. Not candidate for ICD given adv age.  Marland Kitchen ETOH abuse   . Syncope     a. Felt to be postural syncope related to diuretics 06/2012.  Marland Kitchen Heart murmur   . DVT (deep venous thrombosis)     "I think in both legs"  . Pneumonia     "couple times"  . DDD (degenerative disc disease)   . Arthritis     "all over"  . Depression   . CAD- non obstructive disease by cath 3/14   . PAD (peripheral artery disease)   . Gangrene of toe 10/03/2014  .  Sepsis due to other etiology 10/03/2014  . Chronic systolic heart failure 4/85/46    EF improved 50-55%  . Chronic systolic CHF (congestive heart failure)     a. NICM - patent cors 06/2012, EF 40% at that time. b. 03/2013 eval: EF 20-25%.  . Chronic lower back pain   . Bladder cancer 03/15/2012    pt denies this hx on 10/15/2014    Past Surgical History  Procedure Laterality Date  . Transthoracic echocardiogram  09-22-2010    MODERATE LVH/ EF 27%/ GRADE I DIASTOLIC DYSFUNCTION/ AORTIC SCLEROSIS WITHOUT STENOSIS/  RV  SYSTOLIC  MILDLY REDUCED FUNCTION  . Cardiovascular stress test  10-15-2010    LOW RISK NUCLEAR STUDY/ NO EVIDENCE OF ISCHEMIA/ NORMAL EF  . Shoulder surgery  1970's    "don't remember which side or what kind of OR"  . Transurethral resection of bladder tumor  10/07/2011    Procedure: TRANSURETHRAL RESECTION OF BLADDER TUMOR (TURBT);  Surgeon: Malka So, MD;  Location: [facility];  Service: Urology;  Laterality: N/A;  . Cystoscopy  10/07/2011    Procedure: CYSTOSCOPY;  Surgeon: Malka So, MD;  Location: [facility];  Service: Urology;  Laterality: N/A;  .  Cystoscopy with biopsy  03/14/2012    Procedure: CYSTOSCOPY WITH BIOPSY;  Surgeon: Malka So, MD;  Location: [facility];  Service: Urology;  Laterality: N/A;  WITH FULGURATION  . Transurethral resection of prostate  03/14/2012    Procedure: TRANSURETHRAL RESECTION OF THE PROSTATE WITH GYRUS INSTRUMENTS;  Surgeon: Malka So, MD;  Location: [facility];  Service: Urology;  Laterality: N/A;  . Tonsillectomy    . Cataract extraction w/ intraocular lens  implant, bilateral Bilateral   . Cardiac catheterization  05-11-2004  [facility] [place] PLAQUE/ NORMAL LVF/ EF 55%/  NON-OBSTRUCTIVE LAD 25%  . Cardiac catheterization  06/2012  . Eye surgery    . Amputation Left 10/16/2014    Procedure: AMPUTATION ABOVE KNEE- LEFT;  Surgeon: Mal Misty, MD;  Location: [facility];  Service: Vascular;   Laterality: Left;    There were no vitals filed for this visit.  Visit Diagnosis:  Functional weakness  Abnormality of gait  Decreased functional activity tolerance  Unsteadiness  Balance problems      Subjective Assessment - 01/08/15 1410    Subjective Right foot still hurting. No falls. Saw md about foot. Was told he "has fluid on it". His lasix was increased and he was started on pain medication (has not taken this yet).    Currently in Pain? Yes   Pain Score 7    Pain Location Foot   Pain Orientation Right   Pain Descriptors / Indicators Aching;Sore;Tender;Throbbing   Pain Type Acute pain   Pain Onset 1 to 4 weeks ago   Pain Frequency Constant   Aggravating Factors  unknown   Pain Relieving Factors elevating foot, rest            [facility] Adult PT Treatment/Exercise - 01/08/15 1412    Transfers   Sit to Stand 4: Min assist;With upper extremity assist;With armrests;From chair/3-in-1   Sit to Stand Details Verbal cues for technique;Verbal cues for sequencing;Verbal cues for precautions/safety;Manual facilitation for weight shifting;Verbal cues for safe use of DME/AE   Sit to Stand Details (indicate cue type and reason) cues to scoot forward, for hand placment, for foot placement and for anterior weight shifting to stand. cues to lock knee out on prosthesis once standing   Stand to Sit 4: Min assist;With upper extremity assist;With armrests;To chair/3-in-1   Stand to Sit Details (indicate cue type and reason) Verbal cues for precautions/safety;Verbal cues for safe use of DME/AE   Stand to Sit Details cues to turn all the way to surface before sitting down, to reach back and to unlock knee before sitting down.   Ambulation/Gait   Ambulation/Gait Yes   Ambulation/Gait Assistance 4: Min assist   Ambulation/Gait Assistance Details cues on posture, step lenght, knee stability with stance and walker position with gait   Ambulation Distance (Feet) 70 Feet   Assistive device  Prosthesis;Rolling walker   Gait Pattern Step-to pattern;Decreased step length - right;Decreased stance time - left;Decreased stride length;Decreased hip/knee flexion - left;Decreased weight shift to left;Left hip hike;Left circumduction;Trunk flexed;Abducted - left;Poor foot clearance - left   Ambulation Surface Level;Indoor   Stairs Yes   Stairs Assistance 4: Min assist   Stairs Assistance Details (indicate cue type and reason) mod cues on sequence, for prosthetic knee stability, to advance hands and on posture    Stair Management Technique Two rails;Step to pattern;Forwards   Number of Stairs 4   Ramp 3: Mod assist  min to mod assist of 2  people   Ramp Details (indicate cue type and reason) max cues on sequence, technique and posture. 2 people needed for safety.    Curb 4: Min assist  with walker/prosthesis and 2 person assist for safety   Curb Details (indicate cue type and reason) max cues on sequence/technique, posture and foot position with advancing walker.   Prosthetics   Current prosthetic wear tolerance (days/week)  daily   Current prosthetic wear tolerance (#hours/day)  5-6 hours total, break midway   Residual limb condition  skin intact per pt report   Education Provided Residual limb care;Correct ply sock adjustment   Person(s) Educated Patient   Education Method Explanation;Tactile cues;Verbal cues   Education Method Verbalized understanding   Donning Prosthesis Minimal assist   Doffing Prosthesis Supervision            PT Short Term Goals - 12/17/14 1445    PT SHORT TERM GOAL #1   Title Patient donnes prosthesis correctly modified independent. (Target Date: 01/15/2015)   Time 1   Period Months   Status New   PT SHORT TERM GOAL #2   Title Patient tolerates wear >10hrs total per day without skin issues or limb pain.  (Target Date: 01/15/2015)   Time 1   Period Months   Status New   PT SHORT TERM GOAL #3   Title Patient performs standing balance with RW &  prosthesis reaching 10", to floor and manages pants for toileting modified inependent.  (Target Date: 01/15/2015)   Time 1   Period Months   Status New   PT SHORT TERM GOAL #4   Title Patient ambulates 200' with RW & prosthesis with SBA.  (Target Date: 01/15/2015)   Time 1   Period Months   Status New   PT SHORT TERM GOAL #5   Title Patient negotiates stairs (2 rails), ramps & curbs with RW & prosthesis with Minimal assist.  (Target Date: 01/15/2015)   Time 1   Period Months   Status New           PT Long Term Goals - 12/17/14 1445    PT LONG TERM GOAL #1   Title Patient verbalizes proper prosthetic care.  (Target Date: 02/14/2015)   Time 2   Period Months   Status New   PT LONG TERM GOAL #2   Title Patient tolerates wear of prosthesis >80% of awake hours without skin issues or limb pain.  (Target Date: 02/14/2015)   Time 2   Period Months   Status New   PT LONG TERM GOAL #3   Title Berg Balance >25/56  (Target Date: 02/14/2015)   Time 2   Period Months   Status New   PT LONG TERM GOAL #4   Title Patient ambulates 300' with LRAD & prosthesis modified independent to enable community mobility.  (Target Date: 02/14/2015)   Time 2   Period Months   Status New   PT LONG TERM GOAL #5   Title Patient negotiates stairs, ramps & curbs with LRAD & prosthesis modified independent to enable community mobility.  (Target Date: 02/14/2015)   Time 2   Period Months   Status New   Additional Long Term Goals   Additional Long Term Goals Yes   PT LONG TERM GOAL #6   Title Patient ambulates household around furniture with LRAD modified independent.  (Target Date: 02/14/2015)   Time 2   Period Months   Status New  Plan - 01/08/15 1412    Clinical Impression Statement Pt continues to make steady progress toward goals.    Pt will benefit from skilled therapeutic intervention in order to improve on the following deficits Abnormal gait;Decreased activity tolerance;Decreased  balance;Decreased endurance;Decreased knowledge of use of DME;Decreased mobility;Decreased range of motion;Decreased skin integrity;Decreased strength;Postural dysfunction;Prosthetic Dependency   Rehab Potential Good   PT Frequency 2x / week   PT Duration Other (comment)  9 weeks (60 days)   PT Treatment/Interventions ADLs/Self Care Home Management;DME Instruction;Gait training;Stair training;Functional mobility training;Therapeutic activities;Therapeutic exercise;Balance training;Neuromuscular re-education;Patient/family education;Prosthetic Training;Manual techniques   PT Next Visit Plan  gait with RW & prosthesis, review stairs, ramps & curbs   Consulted and Agree with Plan of Care Patient;Family member/caregiver   Family Member Consulted aid        Problem List Patient Active Problem List   Diagnosis Date Noted  . Status post above knee amputation of left lower extremity 10/18/2014  . Tachyarrhythmia 10/05/2014  . Aspiration pneumonia 10/05/2014  . UTI (lower urinary tract infection) 10/03/2014  . Sepsis due to other etiology 10/03/2014  . Gangrene of toe 10/03/2014  . Ischemic ulcer of left foot 10/03/2014  . Atherosclerotic PVD with ulceration 10/03/2014  . Warfarin-induced coagulopathy 10/03/2014  . Atrial fibrillation with RVR 10/03/2014  . Acute kidney injury 10/03/2014  . Hyperkalemia 10/03/2014  . Cellulitis of left leg 10/03/2014  . PAD (peripheral artery disease) 09/24/2014  . NSTEMI (non-ST elevated myocardial infarction) 10/09/2013  . High risk medication use 05/17/2013  . BPH (benign prostatic hyperplasia) 05/17/2013  . Vitamin D deficiency 05/17/2013  . History of pulmonary embolism (2014) 04/17/2013  . NSVT (nonsustained ventricular tachycardia)- not an ICD candidate 04/17/2013  . NICM (nonischemic cardiomyopathy)- EF 20-25% by Echo 04/12/13 04/17/2013  . Chronic diastolic heart failure, NYHA class 1 03/15/2013  . DVT of lower extremity, bilateral 03/15/2013  .  Fatigue 11/02/2012  . Constipation 11/02/2012  . Alcohol abuse 08/27/2012  . Syncope 06/21/2012  . Bladder cancer 03/15/2012  . Benign localized hyperplasia of prostate with urinary obstruction 03/15/2012  . Benign essential HTN   . Hyperlipidemia   . PVCs (premature ventricular contractions)   . CAD- non obstructive disease by cath 3/14     Willow Ora 01/09/2015, 4:11 PM  Willow Ora, PTA, [place] [address] [place], [place] [address] [phone] 01/09/2015, 4:11 PM

## 2015-01-09 NOTE — Telephone Encounter (Signed)
Patient aware results 

## 2015-01-10 ENCOUNTER — Encounter: Payer: Medicare Other | Admitting: Physical Therapy

## 2015-01-13 ENCOUNTER — Other Ambulatory Visit: Payer: Self-pay | Admitting: *Deleted

## 2015-01-13 ENCOUNTER — Encounter: Payer: Self-pay | Admitting: Physical Therapy

## 2015-01-13 ENCOUNTER — Ambulatory Visit: Payer: Medicare Other | Attending: Family Medicine | Admitting: Physical Therapy

## 2015-01-13 DIAGNOSIS — R2689 Other abnormalities of gait and mobility: Secondary | ICD-10-CM

## 2015-01-13 DIAGNOSIS — R29818 Other symptoms and signs involving the nervous system: Secondary | ICD-10-CM | POA: Insufficient documentation

## 2015-01-13 DIAGNOSIS — Z4789 Encounter for other orthopedic aftercare: Secondary | ICD-10-CM

## 2015-01-13 DIAGNOSIS — Z89612 Acquired absence of left leg above knee: Secondary | ICD-10-CM | POA: Diagnosis not present

## 2015-01-13 DIAGNOSIS — R2681 Unsteadiness on feet: Secondary | ICD-10-CM | POA: Insufficient documentation

## 2015-01-13 DIAGNOSIS — R531 Weakness: Secondary | ICD-10-CM | POA: Diagnosis not present

## 2015-01-13 DIAGNOSIS — Z5189 Encounter for other specified aftercare: Secondary | ICD-10-CM | POA: Diagnosis not present

## 2015-01-13 DIAGNOSIS — R6889 Other general symptoms and signs: Secondary | ICD-10-CM | POA: Diagnosis not present

## 2015-01-13 DIAGNOSIS — R269 Unspecified abnormalities of gait and mobility: Secondary | ICD-10-CM | POA: Insufficient documentation

## 2015-01-13 MED ORDER — FERROUS SULFATE 325 (65 FE) MG PO TABS: 325.0000 mg | ORAL_TABLET | Freq: Every day | ORAL | Status: DC

## 2015-01-13 MED ORDER — FOLIC ACID 1 MG PO TABS: 1.0000 mg | ORAL_TABLET | Freq: Every day | ORAL | Status: DC

## 2015-01-13 MED ORDER — PANTOPRAZOLE SODIUM 40 MG PO TBEC
40.0000 mg | DELAYED_RELEASE_TABLET | Freq: Every day | ORAL | Status: DC
Start: 2015-01-13 — End: 2015-06-30

## 2015-01-13 NOTE — Therapy (Signed)
[facility] [address] [place], [place], [address] Phone: [phone]   Fax:  [phone]  Physical Therapy Treatment  Patient Details  Name: Scott [name] MRN: [id number] Date of Birth: 06/07/1921 Referring Provider:  Chipper Herb, MD  Encounter Date: 01/13/2015      PT End of Session - 01/13/15 1445    Visit Number 8   Number of Visits 18   Date for PT Re-Evaluation 02/14/15   Authorization Type Medicare Do G-code every 10th visit   PT Start Time 1445   PT Stop Time 1530   PT Time Calculation (min) 45 min   Equipment Utilized During Treatment Gait belt   Activity Tolerance Patient tolerated treatment well   Behavior During Therapy [facility] for tasks assessed/performed      Past Medical History  Diagnosis Date  . HTN (hypertension)   . Hyperlipidemia   . PVCs (premature ventricular contractions)   . Glaucoma   . PVD (peripheral vascular disease) (HCC)     Right ABI .75, Left .78 (2006)  . Popliteal artery aneurysm, bilateral (HCC) DECUMENTED CHRONIC PARTIAL OCCLUSION--  PT DENIES CLAUDICATION OR ANY OTHER SYMPTOMS  . Frequency of urination   . Nocturia   . DVT, bilateral lower limbs ([place]) 03/2013    a. Bilat DVT dx 03/2013.  . Pulmonary embolism ([place])     a. By CT angio 04/2013.  Marland Kitchen Elevated troponin     a. Adm 12/30-04/2013 - not felt to represent ACS; ?due to PE. b. Patent cors 06/2012.  Marland Kitchen NSVT (nonsustained ventricular tachycardia) ([place])     a. NSVT 06/2012; NSVT also seen during 03/2013 adm. b. Med rx. Not candidate for ICD given adv age.  Marland Kitchen ETOH abuse   . Syncope     a. Felt to be postural syncope related to diuretics 06/2012.  Marland Kitchen Heart murmur   . DVT (deep venous thrombosis) ([place])     "I think in both legs"  . Pneumonia     "couple times"  . DDD (degenerative disc disease)   . Arthritis     "all over"  . Depression   . CAD- non obstructive disease by cath 3/14   . PAD (peripheral artery disease)  ([place])   . Gangrene of toe ([place]) 10/03/2014  . Sepsis due to other etiology ([place]) 10/03/2014  . Chronic systolic heart failure ([place]) 10/04/14    EF improved 50-55%  . Chronic systolic CHF (congestive heart failure) ([place])     a. NICM - patent cors 06/2012, EF 40% at that time. b. 03/2013 eval: EF 20-25%.  . Chronic lower back pain   . Bladder cancer ([place]) 03/15/2012    pt denies this hx on 10/15/2014    Past Surgical History  Procedure Laterality Date  . Transthoracic echocardiogram  09-22-2010    MODERATE LVH/ EF 00%/ GRADE I DIASTOLIC DYSFUNCTION/ AORTIC SCLEROSIS WITHOUT STENOSIS/  RV  SYSTOLIC  MILDLY REDUCED FUNCTION  . Cardiovascular stress test  10-15-2010    LOW RISK NUCLEAR STUDY/ NO EVIDENCE OF ISCHEMIA/ NORMAL EF  . Shoulder surgery  1970's    "don't remember which side or what kind of OR"  . Transurethral resection of bladder tumor  10/07/2011    Procedure: TRANSURETHRAL RESECTION OF BLADDER TUMOR (TURBT);  Surgeon: Malka So, MD;  [facility];  Service: Urology;  Laterality: N/A;  . Cystoscopy  10/07/2011    Procedure: CYSTOSCOPY;  Surgeon: Malka So, MD;  Location: [facility];  Service: Urology;  Laterality: N/A;  . Cystoscopy with biopsy  03/14/2012    Procedure: CYSTOSCOPY WITH BIOPSY;  Surgeon: Malka So, MD;  Location: [facility];  Service: Urology;  Laterality: N/A;  WITH FULGURATION  . Transurethral resection of prostate  03/14/2012    Procedure: TRANSURETHRAL RESECTION OF THE PROSTATE WITH GYRUS INSTRUMENTS;  Surgeon: Malka So, MD;  Location: [facility];  Service: Urology;  Laterality: N/A;  . Tonsillectomy    . Cataract extraction w/ intraocular lens  implant, bilateral Bilateral   . Cardiac catheterization  05-11-2004  DR [place] PLAQUE/ NORMAL LVF/ EF 55%/  NON-OBSTRUCTIVE LAD 25%  . Cardiac catheterization  06/2012  . Eye surgery    . Amputation Left 10/16/2014    Procedure: AMPUTATION ABOVE KNEE- LEFT;   Surgeon: Mal Misty, MD;  Location: [facility];  Service: Vascular;  Laterality: Left;    There were no vitals filed for this visit.  Visit Diagnosis:  Functional weakness  Abnormality of gait  Decreased functional activity tolerance  Unsteadiness  Balance problems  Status post above knee amputation of left lower extremity ([place])  Encounter for prosthetic gait training                       [facility] Adult PT Treatment/Exercise - 01/13/15 1445    Transfers   Sit to Stand 4: Min assist;With upper extremity assist;With armrests;From chair/3-in-1   Sit to Stand Details Verbal cues for technique;Verbal cues for sequencing;Verbal cues for precautions/safety;Manual facilitation for weight shifting;Verbal cues for safe use of DME/AE   Sit to Stand Details (indicate cue type and reason) cues on prosthetic knee control   Stand to Sit 4: Min assist;With upper extremity assist;With armrests;To chair/3-in-1   Stand to Sit Details (indicate cue type and reason) Verbal cues for precautions/safety;Verbal cues for safe use of DME/AE   Stand to Sit Details cues on prosthetic control   Ambulation/Gait   Ambulation/Gait Yes   Ambulation/Gait Assistance 4: Min guard;4: Min assist  first 45' min guard, then minA for remaining 6'   Ambulation/Gait Assistance Details cues on posture especially when he fatigues ~60', wt shift over prosthesis in stance   Ambulation Distance (Feet) 125 Feet   Assistive device Prosthesis;Rolling walker   Gait Pattern Step-to pattern;Decreased step length - right;Decreased stance time - left;Decreased stride length;Decreased hip/knee flexion - left;Decreased weight shift to left;Left hip hike;Left circumduction;Trunk flexed;Abducted - left;Poor foot clearance - left   Ambulation Surface Indoor;Level   Stairs --   Stairs Assistance --   Stair Management Technique --   Number of Stairs --   Ramp 4: Min assist  RW & prosthesis   Ramp Details (indicate cue  type and reason) verbal cues on technique   Curb 4: Min assist  RW & prosthesis   Curb Details (indicate cue type and reason) cues on sequence & technique   Balance   Balance Assessed Yes   Static Standing Balance   Static Standing - Balance Support During functional activity;Bilateral upper extremity supported  RW & prosthesis   Static Standing - Level of Assistance 5: Stand by assistance   Static Standing - Comment/# of Minutes 3  minutes   Dynamic Standing Balance   Dynamic Standing - Balance Support Left upper extremity supported;During functional activity  RW & prosthesis support   Dynamic Standing - Level of Assistance 5: Stand by assistance   Dynamic Standing -  Balance Activities Reaching for objects;Other (comment)  simulated managing pants   Reaching for objects comments: reaches 10" anteriorly & to floor   Turn head to look over shoulder comments: turns to side with slight shoulder rotation but minimal wt shift   Prosthetics   Prosthetic Care Comments  reports he can donne prosthesis with increased time if he had to donne alone.    Current prosthetic wear tolerance (days/week)  daily   Current prosthetic wear tolerance (#hours/day)  5hrs 2/xday, reports donnes after morning bath, then wears to his nap, puts it back on for ~5hours after nap   Residual limb condition  skin intact per pt report   Education Provided Residual limb care;Correct ply sock adjustment;Proper Donning   Person(s) Educated Patient   Education Method Explanation;Verbal cues   Education Method Verbalized understanding;Verbal cues required;Needs further instruction   Donning Prosthesis Minimal assist  arrived with liner donned, engaged pin into prosthesis SBA   Doffing Prosthesis Supervision                  PT Short Term Goals - 01/13/15 1445    PT SHORT TERM GOAL #1   Title Patient donnes prosthesis correctly modified independent. (Target Date: 01/15/2015)   Time 1   Period Months   Status  On-going   PT SHORT TERM GOAL #2   Title Patient tolerates wear >10hrs total per day without skin issues or limb pain.  (Target Date: 01/15/2015)   Baseline MET 01/13/2015 Patient reports wear 5hrs 2x/day with  skin issues or limb pain.    Time 1   Period Months   Status Achieved   PT SHORT TERM GOAL #3   Title Patient performs standing balance with RW & prosthesis reaching 10", to floor and manages pants for toileting modified inependent.  (Target Date: 01/15/2015)   Baseline Partially MET 01/13/2015 Patient performs standing balance with RW & prosthesis reaching 10", to floor and simulates managing pants with supervision.    Time 1   Period Months   Status Partially Met   PT SHORT TERM GOAL #4   Title Patient ambulates 200' with RW & prosthesis with SBA.  (Target Date: 01/15/2015)   Baseline NOT MET 01/13/2015 Patient ambulates 125' with RW & prosthesis with minimal assist.    Time 1   Period Months   Status Not Met   PT SHORT TERM GOAL #5   Title Patient negotiates stairs (2 rails), ramps & curbs with RW & prosthesis with Minimal assist.  (Target Date: 01/15/2015)   Time 1   Period Months   Status On-going           PT Long Term Goals - 01/13/15 1445    PT LONG TERM GOAL #1   Title Patient verbalizes proper prosthetic care including donning.  (Target Date: 02/14/2015)   Time 2   Period Months   Status On-going   PT LONG TERM GOAL #2   Title Patient tolerates wear of prosthesis >80% of awake hours without skin issues or limb pain.  (Target Date 02/14/2015)   Time 2   Period Months   Status On-going   PT LONG TERM GOAL #3   Title Berg Balance >25/56  (Target Date: 02/14/2015)   Time 2   Period Months   Status On-going   PT LONG TERM GOAL #4   Title Patient ambulates 200' with rolling walker & prosthesis with supervision to enable community mobility.  (Target Date 02/14/2015)   Time 2  Period Months   Status Revised   PT LONG TERM GOAL #5   Title Patient negotiates stairs,  ramps & curbs with rolling walker & prosthesis with supervision to enable community mobility.  (Target Date: 02/14/2015)   Time 2   Period Months   Status Revised   PT LONG TERM GOAL #6   Title Patient ambulates household around furniture with rolling walker & prosthesis modified independent.  (Target Date: 11/4//2016)   Time 2   Period Months   Status Revised               Plan - 01/13/15 1445    Clinical Impression Statement Patient met 1 of STG and progressed towards remaining. Patient wants to continue care at our facility. Initial plan was to consider transferring his care to our [facility] which is close to his home compared to 45 minute commute. Patient is progressing with functional goal to be independent in home with RW & supervision for basic community with RW.   Pt will benefit from skilled therapeutic intervention in order to improve on the following deficits Abnormal gait;Decreased activity tolerance;Decreased balance;Decreased endurance;Decreased knowledge of use of DME;Decreased mobility;Decreased range of motion;Decreased skin integrity;Decreased strength;Postural dysfunction;Prosthetic Dependency   Rehab Potential Good   PT Frequency 2x / week   PT Duration Other (comment)  9 weeks (60 days)   PT Treatment/Interventions ADLs/Self Care Home Management;DME Instruction;Gait training;Stair training;Functional mobility training;Therapeutic activities;Therapeutic exercise;Balance training;Neuromuscular re-education;Patient/family education;Prosthetic Training;Manual techniques   PT Next Visit Plan Assess remaining STGs,  gait with RW & prosthesis, review stairs, ramps & curbs   Consulted and Agree with Plan of Care Patient        Problem List Patient Active Problem List   Diagnosis Date Noted  . Status post above knee amputation of left lower extremity ([place]) 10/18/2014  . Tachyarrhythmia 10/05/2014  . Aspiration pneumonia ([place]) 10/05/2014  . UTI (lower urinary  tract infection) 10/03/2014  . Sepsis due to other etiology ([place]) 10/03/2014  . Gangrene of toe ([place]) 10/03/2014  . Ischemic ulcer of left foot ([place]) 10/03/2014  . Atherosclerotic PVD with ulceration ([place]) 10/03/2014  . Warfarin-induced coagulopathy ([place]) 10/03/2014  . Atrial fibrillation with RVR ([place]) 10/03/2014  . Acute kidney injury ([place]) 10/03/2014  . Hyperkalemia 10/03/2014  . Cellulitis of left leg 10/03/2014  . PAD (peripheral artery disease) ([place]) 09/24/2014  . NSTEMI (non-ST elevated myocardial infarction) ([place]) 10/09/2013  . High risk medication use 05/17/2013  . BPH (benign prostatic hyperplasia) 05/17/2013  . Vitamin D deficiency 05/17/2013  . History of pulmonary embolism (2014) 04/17/2013  . NSVT (nonsustained ventricular tachycardia)- not an ICD candidate 04/17/2013  . NICM (nonischemic cardiomyopathy)- EF 20-25% by Echo 04/12/13 04/17/2013  . Chronic diastolic heart failure, NYHA class 1 ([place]) 03/15/2013  . DVT of lower extremity, bilateral ([place]) 03/15/2013  . Fatigue 11/02/2012  . Constipation 11/02/2012  . Alcohol abuse 08/27/2012  . Syncope 06/21/2012  . Bladder cancer ([place]) 03/15/2012  . Benign localized hyperplasia of prostate with urinary obstruction 03/15/2012  . Benign essential HTN   . Hyperlipidemia   . PVCs (premature ventricular contractions)   . CAD- non obstructive disease by cath 3/14     Brighton Pilley PT, DPT 01/13/2015, 9:09 PM  [facility] [address] [place], [place], [address] Phone: [phone]   Fax:  [phone]

## 2015-01-14 ENCOUNTER — Ambulatory Visit (INDEPENDENT_AMBULATORY_CARE_PROVIDER_SITE_OTHER): Payer: Medicare Other

## 2015-01-14 ENCOUNTER — Encounter: Payer: Self-pay | Admitting: Family Medicine

## 2015-01-14 ENCOUNTER — Ambulatory Visit (INDEPENDENT_AMBULATORY_CARE_PROVIDER_SITE_OTHER): Payer: Medicare Other | Admitting: Family Medicine

## 2015-01-14 VITALS — BP 125/64 | HR 59 | Temp 97.0°F | Ht 67.0 in | Wt 150.0 lb

## 2015-01-14 DIAGNOSIS — S9031XD Contusion of right foot, subsequent encounter: Secondary | ICD-10-CM

## 2015-01-14 DIAGNOSIS — M79671 Pain in right foot: Secondary | ICD-10-CM

## 2015-01-14 DIAGNOSIS — Z23 Encounter for immunization: Secondary | ICD-10-CM | POA: Diagnosis not present

## 2015-01-14 DIAGNOSIS — I82403 Acute embolism and thrombosis of unspecified deep veins of lower extremity, bilateral: Secondary | ICD-10-CM

## 2015-01-14 DIAGNOSIS — I739 Peripheral vascular disease, unspecified: Secondary | ICD-10-CM | POA: Diagnosis not present

## 2015-01-14 DIAGNOSIS — I70269 Atherosclerosis of native arteries of extremities with gangrene, unspecified extremity: Secondary | ICD-10-CM

## 2015-01-14 DIAGNOSIS — I2699 Other pulmonary embolism without acute cor pulmonale: Secondary | ICD-10-CM | POA: Diagnosis not present

## 2015-01-14 DIAGNOSIS — L03031 Cellulitis of right toe: Secondary | ICD-10-CM | POA: Diagnosis not present

## 2015-01-14 LAB — POCT INR: INR: 3.7

## 2015-01-14 NOTE — Progress Notes (Addendum)
Subjective:    Patient ID: Scott [name], male    DOB: [birth year], 81 y.o.   MRN: [id number]  HPI Patient here today for 1 week follow up on right lower extremity redness, swelling and pain. He is doing better and is wearing the compression hose regularly. He was placed on doxycycline and his Lasix was increased He is accompanied today by one of his in home caregivers and his son. Patient does not recall any injury to his foot. According to the nurses who saw this a week ago it has not changed a lot as far as the redness and swelling although the patient and son say the swelling is improved. He recently had an amputation of the other leg secondary to an area of cellulitis which would not heal. There is no drainage redness or fever to this foot. The patient is alert today and is wheelchair.       Patient Active Problem List   Diagnosis Date Noted  . Status post above knee amputation of left lower extremity ([place]) 10/18/2014  . Tachyarrhythmia 10/05/2014  . Aspiration pneumonia ([place]) 10/05/2014  . UTI (lower urinary tract infection) 10/03/2014  . Sepsis due to other etiology ([place]) 10/03/2014  . Gangrene of toe ([place]) 10/03/2014  . Ischemic ulcer of left foot ([place]) 10/03/2014  . Atherosclerotic PVD with ulceration ([place]) 10/03/2014  . Warfarin-induced coagulopathy ([place]) 10/03/2014  . Atrial fibrillation with RVR ([place]) 10/03/2014  . Acute kidney injury ([place]) 10/03/2014  . Hyperkalemia 10/03/2014  . Cellulitis of left leg 10/03/2014  . PAD (peripheral artery disease) ([place]) 09/24/2014  . NSTEMI (non-ST elevated myocardial infarction) ([place]) 10/09/2013  . High risk medication use 05/17/2013  . BPH (benign prostatic hyperplasia) 05/17/2013  . Vitamin D deficiency 05/17/2013  . History of pulmonary embolism (2014) 04/17/2013  . NSVT (nonsustained ventricular tachycardia)- not an ICD candidate 04/17/2013  . NICM (nonischemic cardiomyopathy)- EF 20-25% by Echo 04/12/13 04/17/2013  . Chronic  diastolic heart failure, NYHA class 1 ([place]) 03/15/2013  . DVT of lower extremity, bilateral ([place]) 03/15/2013  . Fatigue 11/02/2012  . Constipation 11/02/2012  . Alcohol abuse 08/27/2012  . Syncope 06/21/2012  . Bladder cancer ([place]) 03/15/2012  . Benign localized hyperplasia of prostate with urinary obstruction 03/15/2012  . Benign essential HTN   . Hyperlipidemia   . PVCs (premature ventricular contractions)   . CAD- non obstructive disease by cath 3/14    Outpatient Encounter Prescriptions as of 01/14/2015  Medication Sig  . alum & mag hydroxide-simeth (MAALOX/MYLANTA) 200-200-20 MG/5ML suspension Take 30 mLs by mouth every 6 (six) hours as needed for indigestion or heartburn (dyspepsia).  . bisacodyl (DULCOLAX) 10 MG suppository Place 1 suppository (10 mg total) rectally daily as needed for moderate constipation.  Marland Kitchen donepezil (ARICEPT) 10 MG tablet TAKE ONE TABLET AT BEDTIME  . doxycycline (VIBRA-TABS) 100 MG tablet Take 1 tablet (100 mg total) by mouth 2 (two) times daily.  . feeding supplement, ENSURE ENLIVE, (ENSURE ENLIVE) LIQD Take 237 mLs by mouth 2 (two) times daily between meals.  . ferrous sulfate (FERROUSUL) 325 (65 FE) MG tablet Take 1 tablet (325 mg total) by mouth daily with breakfast.  . fluticasone (FLONASE) 50 MCG/ACT nasal spray Place 2 sprays into both nostrils daily.  . folic acid (FOLVITE) 1 MG tablet Take 1 tablet (1 mg total) by mouth daily.  . furosemide (LASIX) 40 MG tablet   . gabapentin (NEURONTIN) 100 MG capsule TAKE (2) CAPSULES TWICE DAILY.  . hydrocerin (EUCERIN) CREA  Apply 1 application topically 2 (two) times daily.  . isosorbide mononitrate (IMDUR) 30 MG 24 hr tablet Take 0.5 tablets (15 mg total) by mouth daily. (Patient taking differently: Take 30 mg by mouth daily. )  . metoprolol succinate (TOPROL-XL) 50 MG 24 hr tablet Take 1 tablet (50 mg total) by mouth daily. Take with or immediately following a meal.  . nitroGLYCERIN (NITROSTAT) 0.4 MG SL tablet  Place 1 tablet (0.4 mg total) under the tongue every 5 (five) minutes as needed for chest pain (up to 3 doses).  Marland Kitchen oxyCODONE (OXY IR/ROXICODONE) 5 MG immediate release tablet Take 0.5 tablets (2.5 mg total) by mouth every 6 (six) hours as needed for moderate pain.  . pantoprazole (PROTONIX) 40 MG tablet Take 1 tablet (40 mg total) by mouth daily.  . simvastatin (ZOCOR) 10 MG tablet TAKE 1 TABLET DAILY  . tamsulosin (FLOMAX) 0.4 MG CAPS capsule TAKE (1) CAPSULE DAILY  . thiamine 100 MG tablet Take 1 tablet (100 mg total) by mouth daily.  . traZODone (DESYREL) 50 MG tablet Take 0.5-1 tablets (25-50 mg total) by mouth at bedtime as needed for sleep.  Marland Kitchen ULORIC 40 MG tablet TAKE 1 TABLET DAILY  . warfarin (COUMADIN) 5 MG tablet Take 1 tablet (5 mg total) by mouth daily at 6 PM. (Patient taking differently: Take 4.5 mg by mouth daily at 6 PM. )  . [DISCONTINUED] furosemide (LASIX) 20 MG tablet Take 1 tablet (20 mg total) by mouth daily.   No facility-administered encounter medications on file as of 01/14/2015.      Review of Systems  Constitutional: Negative.   HENT: Negative.   Eyes: Negative.   Respiratory: Negative.   Cardiovascular: Negative.   Gastrointestinal: Negative.   Endocrine: Negative.   Genitourinary: Negative.   Musculoskeletal: Negative.   Skin: Negative.        Right lower ext red, swelling and pain - better.  Allergic/Immunologic: Negative.   Neurological: Negative.   Hematological: Negative.   Psychiatric/Behavioral: Negative.        Objective:   Physical Exam  Constitutional: He is oriented to person, place, and time. He appears well-developed and well-nourished. No distress.  HENT:  Head: Normocephalic.  Eyes: Conjunctivae and EOM are normal. Pupils are equal, round, and reactive to light. Right eye exhibits no discharge. Left eye exhibits no discharge. No scleral icterus.  Neck: Normal range of motion.  Musculoskeletal: He exhibits edema and tenderness.  The  patient has amputation of left lower leg. He has swelling of the right foot and edema of the right foot without rubor or fever or drainage. There is bruising especially around the right great toe MP joint. It is very tender to palpation.  Neurological: He is alert and oriented to person, place, and time.  Skin: Skin is warm and dry. No rash noted. No erythema. No pallor.  Psychiatric: He has a normal mood and affect. His behavior is normal. Thought content normal.  Nursing note and vitals reviewed.  BP 125/64 mmHg  Pulse 59  Temp(Src) 97 F (36.1 C) (Oral)  Ht 5' 7"  (1.702 m)  Wt 150 lb (68.04 kg)  BMI 23.49 kg/m2  [facility] reading (PRIMARY) by  Dr. Governor Rooks foot MP joint   --- no acute fracture observed                              Left side above the knee extremity. His Prosthetics outer surface  covering is in need of replacement due to strong urine odor. He needs a new one that is flexible so that way he can take it off and wash it with soap when needed. Pt demonstrates full range of motion and both the ability and motivation to ambulate. With Prosthetic assistance, Pt ambulates at a K2 functional level and performs his MRADL's independently.       Assessment & Plan:  1. Acute pulmonary embolism (HCC) - POCT INR  2. DVT of lower extremity, bilateral (HCC) - POCT INR  3. Encounter for immunization -Patient will receive his flu shot today  4. Right foot pain -We will use the x-rays to determine where to go from this point. -We'll continue to increase the Lasix since he has had some improvement with this and go to 60 mg on Monday Wednesday and Friday and 40 on the other days the course continuing this dose a week contingent upon his BMP. - CBC with Differential/Platelet - DG Foot Complete Right; Future - BMP8+EGFR  5. Cellulitis of toe, right -He has finished the doxycycline and we will get a CBC today  6. Contusion of right foot, subsequent encounter -X-rays of the foot today  to determine the next direction to proceed from. -He will increase his Lasix to 60 mg Monday Wednesday and Friday and 40 on all other days. -He will continue to elevate the foot above the level of the heart as much as possible and wear support hose upon arising from the bed in the morning  7. Peripheral vascular insufficiency -We will consider trying Trental 400 mg every 8 hours unless his unless his renal function is less than 30.  Patient Instructions  The patient should increase the Lasix from 40 to -60 mg on Monday Wednesday and Friday and stay on 40 mg all the other days (see written note) He should keep the right foot elevated as high as possible at frequent times during the day to help keep the swelling down He should put on the support hose the first thing upon arising in the morning We will get the results of the CBC and a BMP we will make further recommendations We will also take attention to the results of the x-ray will be done today also because of the bruising around the right great toe HOLD COUMADIN / WARFARIN on Tues 10/4 and Wed 10/5 evening this week only. You will need to pull this out of the [facility] --- Thursday you will resume what is already packed.     Arrie Senate MD

## 2015-01-14 NOTE — Patient Instructions (Addendum)
The patient should increase the Lasix from 40 to -60 mg on Monday Wednesday and Friday and stay on 40 mg all the other days (see written note) He should keep the right foot elevated as high as possible at frequent times during the day to help keep the swelling down He should put on the support hose the first thing upon arising in the morning We will get the results of the CBC and a BMP we will make further recommendations We will also take attention to the results of the x-ray will be done today also because of the bruising around the right great toe HOLD COUMADIN / WARFARIN on Tues 10/4 and Wed 10/5 evening this week only. You will need to pull this out of the [facility] --- Thursday you will resume what is already packed.

## 2015-01-15 ENCOUNTER — Other Ambulatory Visit: Payer: Self-pay | Admitting: *Deleted

## 2015-01-15 ENCOUNTER — Encounter: Payer: Self-pay | Admitting: Physical Therapy

## 2015-01-15 ENCOUNTER — Ambulatory Visit: Payer: Medicare Other | Admitting: Physical Therapy

## 2015-01-15 DIAGNOSIS — Z4789 Encounter for other orthopedic aftercare: Secondary | ICD-10-CM

## 2015-01-15 DIAGNOSIS — R6889 Other general symptoms and signs: Secondary | ICD-10-CM

## 2015-01-15 DIAGNOSIS — R269 Unspecified abnormalities of gait and mobility: Secondary | ICD-10-CM | POA: Diagnosis not present

## 2015-01-15 DIAGNOSIS — R2681 Unsteadiness on feet: Secondary | ICD-10-CM | POA: Diagnosis not present

## 2015-01-15 DIAGNOSIS — R29818 Other symptoms and signs involving the nervous system: Secondary | ICD-10-CM | POA: Diagnosis not present

## 2015-01-15 DIAGNOSIS — R531 Weakness: Secondary | ICD-10-CM | POA: Diagnosis not present

## 2015-01-15 DIAGNOSIS — S88912A Complete traumatic amputation of left lower leg, level unspecified, initial encounter: Secondary | ICD-10-CM

## 2015-01-15 DIAGNOSIS — Z89612 Acquired absence of left leg above knee: Secondary | ICD-10-CM

## 2015-01-15 DIAGNOSIS — M79671 Pain in right foot: Secondary | ICD-10-CM

## 2015-01-15 DIAGNOSIS — S9031XD Contusion of right foot, subsequent encounter: Secondary | ICD-10-CM

## 2015-01-15 DIAGNOSIS — I739 Peripheral vascular disease, unspecified: Secondary | ICD-10-CM

## 2015-01-15 DIAGNOSIS — R2689 Other abnormalities of gait and mobility: Secondary | ICD-10-CM

## 2015-01-15 DIAGNOSIS — L03031 Cellulitis of right toe: Secondary | ICD-10-CM

## 2015-01-15 LAB — BMP8+EGFR
BUN/Creatinine Ratio: 16 (ref 10–22)
BUN: 16 mg/dL (ref 10–36)
CO2: 23 mmol/L (ref 18–29)
Calcium: 9 mg/dL (ref 8.6–10.2)
Chloride: 101 mmol/L (ref 97–108)
Creatinine, Ser: 1.03 mg/dL (ref 0.76–1.27)
GFR calc Af Amer: 73 mL/min/{1.73_m2} (ref >59)
GFR calc non Af Amer: 63 mL/min/{1.73_m2} (ref >59)
Glucose: 122 mg/dL — ABNORMAL HIGH (ref 65–99)
Potassium: 4 mmol/L (ref 3.5–5.2)
Sodium: 143 mmol/L (ref 134–144)

## 2015-01-15 LAB — CBC WITH DIFFERENTIAL/PLATELET
Basophils Absolute: 0 10*3/uL (ref 0.0–0.2)
Basos: 0 %
EOS (ABSOLUTE): 0.2 10*3/uL (ref 0.0–0.4)
Eos: 4 %
Hematocrit: 33.3 % — ABNORMAL LOW (ref 37.5–51.0)
Hemoglobin: 10.6 g/dL — ABNORMAL LOW (ref 12.6–17.7)
Immature Grans (Abs): 0 10*3/uL (ref 0.0–0.1)
Immature Granulocytes: 0 %
Lymphocytes Absolute: 1.1 10*3/uL (ref 0.7–3.1)
Lymphs: 29 %
MCH: 25.9 pg — ABNORMAL LOW (ref 26.6–33.0)
MCHC: 31.8 g/dL (ref 31.5–35.7)
MCV: 81 fL (ref 79–97)
Monocytes Absolute: 0.4 10*3/uL (ref 0.1–0.9)
Monocytes: 9 %
Neutrophils Absolute: 2.2 10*3/uL (ref 1.4–7.0)
Neutrophils: 58 %
Platelets: 226 10*3/uL (ref 150–379)
RBC: 4.09 x10E6/uL — ABNORMAL LOW (ref 4.14–5.80)
RDW: 15.5 % — ABNORMAL HIGH (ref 12.3–15.4)
WBC: 3.9 10*3/uL (ref 3.4–10.8)

## 2015-01-15 NOTE — Therapy (Signed)
[facility] [address] [place], [place], [address] Phone: [phone]   Fax:  [phone]  Physical Therapy Treatment  Patient Details  Name: Scott [name] MRN: [id number] Date of Birth: Nov 27, 1921 Referring Provider:  Chipper Herb, MD  Encounter Date: 01/15/2015      PT End of Session - 01/15/15 1150    Visit Number 9   Number of Visits 18   Date for PT Re-Evaluation 02/14/15   Authorization Type Medicare Do G-code every 10th visit   PT Start Time 1150   PT Stop Time 1234   PT Time Calculation (min) 44 min   Equipment Utilized During Treatment Gait belt   Activity Tolerance Patient tolerated treatment well   Behavior During Therapy [facility] for tasks assessed/performed      Past Medical History  Diagnosis Date  . HTN (hypertension)   . Hyperlipidemia   . PVCs (premature ventricular contractions)   . Glaucoma   . PVD (peripheral vascular disease) (HCC)     Right ABI .75, Left .78 (2006)  . Popliteal artery aneurysm, bilateral (HCC) DECUMENTED CHRONIC PARTIAL OCCLUSION--  PT DENIES CLAUDICATION OR ANY OTHER SYMPTOMS  . Frequency of urination   . Nocturia   . DVT, bilateral lower limbs ([place]) 03/2013    a. Bilat DVT dx 03/2013.  . Pulmonary embolism ([place])     a. By CT angio 04/2013.  Marland Kitchen Elevated troponin     a. Adm 12/30-04/2013 - not felt to represent ACS; ?due to PE. b. Patent cors 06/2012.  Marland Kitchen NSVT (nonsustained ventricular tachycardia) ([place])     a. NSVT 06/2012; NSVT also seen during 03/2013 adm. b. Med rx. Not candidate for ICD given adv age.  Marland Kitchen ETOH abuse   . Syncope     a. Felt to be postural syncope related to diuretics 06/2012.  Marland Kitchen Heart murmur   . DVT (deep venous thrombosis) ([place])     "I think in both legs"  . Pneumonia     "couple times"  . DDD (degenerative disc disease)   . Arthritis     "all over"  . Depression   . CAD- non obstructive disease by cath 3/14   . PAD (peripheral artery disease)  ([place])   . Gangrene of toe ([place]) 10/03/2014  . Sepsis due to other etiology ([place]) 10/03/2014  . Chronic systolic heart failure ([place]) 10/04/14    EF improved 50-55%  . Chronic systolic CHF (congestive heart failure) ([place])     a. NICM - patent cors 06/2012, EF 40% at that time. b. 03/2013 eval: EF 20-25%.  . Chronic lower back pain   . Bladder cancer ([place]) 03/15/2012    pt denies this hx on 10/15/2014    Past Surgical History  Procedure Laterality Date  . Transthoracic echocardiogram  09-22-2010    MODERATE LVH/ EF 39%/ GRADE I DIASTOLIC DYSFUNCTION/ AORTIC SCLEROSIS WITHOUT STENOSIS/  RV  SYSTOLIC  MILDLY REDUCED FUNCTION  . Cardiovascular stress test  10-15-2010    LOW RISK NUCLEAR STUDY/ NO EVIDENCE OF ISCHEMIA/ NORMAL EF  . Shoulder surgery  1970's    "don't remember which side or what kind of OR"  . Transurethral resection of bladder tumor  10/07/2011    Procedure: TRANSURETHRAL RESECTION OF BLADDER TUMOR (TURBT);  Surgeon: Malka So, MD;  Location: [facility];  Service: Urology;  Laterality: N/A;  . Cystoscopy  10/07/2011    Procedure: CYSTOSCOPY;  Surgeon: Malka So, MD;  Location: [facility];  Service: Urology;  Laterality: N/A;  . Cystoscopy with biopsy  03/14/2012    Procedure: CYSTOSCOPY WITH BIOPSY;  Surgeon: Malka So, MD;  Location: [facility];  Service: Urology;  Laterality: N/A;  WITH FULGURATION  . Transurethral resection of prostate  03/14/2012    Procedure: TRANSURETHRAL RESECTION OF THE PROSTATE WITH GYRUS INSTRUMENTS;  Surgeon: Malka So, MD;  Location: [facility];  Service: Urology;  Laterality: N/A;  . Tonsillectomy    . Cataract extraction w/ intraocular lens  implant, bilateral Bilateral   . Cardiac catheterization  05-11-2004  DR [place] PLAQUE/ NORMAL LVF/ EF 55%/  NON-OBSTRUCTIVE LAD 25%  . Cardiac catheterization  06/2012  . Eye surgery    . Amputation Left 10/16/2014    Procedure: AMPUTATION ABOVE KNEE- LEFT;   Surgeon: Mal Misty, MD;  Location: [facility];  Service: Vascular;  Laterality: Left;    There were no vitals filed for this visit.  Visit Diagnosis:  Functional weakness  Abnormality of gait  Decreased functional activity tolerance  Unsteadiness  Balance problems  Status post above knee amputation of left lower extremity ([place])  Encounter for prosthetic gait training      Subjective Assessment - 01/15/15 1150    Subjective Getting in & out of car is difficult & request to work on again.   Currently in Pain? Yes   Pain Score 6    Pain Location Foot   Pain Orientation Right   Pain Descriptors / Indicators Aching;Sore;Tender;Throbbing   Pain Type Acute pain   Pain Onset 1 to 4 weeks ago   Pain Frequency Constant   Aggravating Factors  sitting with foot in dependent position   Pain Relieving Factors elevating foot, rest   Multiple Pain Sites No                         [facility] Adult PT Treatment/Exercise - 01/15/15 1150    Transfers   Sit to Stand 4: Min assist;With upper extremity assist;With armrests;From chair/3-in-1  from rollator walker seat   Sit to Stand Details Verbal cues for technique;Verbal cues for sequencing;Verbal cues for precautions/safety;Manual facilitation for weight shifting;Verbal cues for safe use of DME/AE   Sit to Stand Details (indicate cue type and reason) cues on prosthetic knee control   Stand to Sit 4: Min assist;With upper extremity assist;With armrests;To chair/3-in-1  to rollator walker seat   Stand to Sit Details (indicate cue type and reason) Verbal cues for precautions/safety;Verbal cues for safe use of DME/AE   Stand to Sit Details cues on rollator walker technique   Stand Pivot Transfers 4: Min assist;With armrests  with RW to /from car   Stand Pivot Transfer Details (indicate cue type and reason) PT instructed pt & aide in need to come to complete stand in order for prosthesis to aid or support him and he needs RW support  to stand. Patient and aide report they have been trying squat pivot transfer which is why car transfers have been so difficult.   Ambulation/Gait   Ambulation/Gait Yes   Ambulation/Gait Assistance 4: Min assist   Ambulation/Gait Assistance Details PT demo, instructed prior & verbal/manual cues during in use of rollator walker compared to RW especially brake management   Ambulation Distance (Feet) 60 Feet  60' & 90'   Assistive device Prosthesis;Rollator   Gait Pattern Step-to pattern;Decreased step length - right;Decreased stance time - left;Decreased  stride length;Decreased hip/knee flexion - left;Decreased weight shift to left;Left hip hike;Left circumduction;Trunk flexed;Abducted - left;Poor foot clearance - left   Ambulation Surface Indoor;Level   Gait Comments turning 180* to position to sit on rollator & return to walking / standing up   Prosthetics   Prosthetic Care Comments  donning long pants with prosthesis & put shoe on / off   Current prosthetic wear tolerance (days/week)  daily   Current prosthetic wear tolerance (#hours/day)  5hrs 2/xday, reports donnes after morning bath, then wears to his nap, puts it back on for ~5hours after nap   Residual limb condition  intact   Education Provided Residual limb care;Correct ply sock adjustment;Proper Donning   Person(s) Educated Patient   Education Method Explanation;Verbal cues;Demonstration   Education Method Verbalized understanding;Verbal cues required;Needs further instruction   Donning Prosthesis Minimal assist   Doffing Prosthesis Supervision                  PT Short Term Goals - 01/13/15 1445    PT SHORT TERM GOAL #1   Title Patient donnes prosthesis correctly modified independent. (Target Date: 01/15/2015)   Time 1   Period Months   Status On-going   PT SHORT TERM GOAL #2   Title Patient tolerates wear >10hrs total per day without skin issues or limb pain.  (Target Date: 01/15/2015)   Baseline MET 01/13/2015  Patient reports wear 5hrs 2x/day with  skin issues or limb pain.    Time 1   Period Months   Status Achieved   PT SHORT TERM GOAL #3   Title Patient performs standing balance with RW & prosthesis reaching 10", to floor and manages pants for toileting modified inependent.  (Target Date: 01/15/2015)   Baseline Partially MET 01/13/2015 Patient performs standing balance with RW & prosthesis reaching 10", to floor and simulates managing pants with supervision.    Time 1   Period Months   Status Partially Met   PT SHORT TERM GOAL #4   Title Patient ambulates 200' with RW & prosthesis with SBA.  (Target Date: 01/15/2015)   Baseline NOT MET 01/13/2015 Patient ambulates 125' with RW & prosthesis with minimal assist.    Time 1   Period Months   Status Not Met   PT SHORT TERM GOAL #5   Title Patient negotiates stairs (2 rails), ramps & curbs with RW & prosthesis with Minimal assist.  (Target Date: 01/15/2015)   Time 1   Period Months   Status On-going           PT Long Term Goals - 01/15/15 1150    PT LONG TERM GOAL #1   Title Patient verbalizes proper prosthetic care including donning.  (Target Date: 02/14/2015)   Time 2   Period Months   Status On-going   PT LONG TERM GOAL #2   Title Patient tolerates wear of prosthesis >80% of awake hours without skin issues or limb pain.  (Target Date 02/14/2015)   Time 2   Period Months   Status On-going   PT LONG TERM GOAL #3   Title [place] Balance >25/56  (Target Date: 02/14/2015)   Time 2   Period Months   Status On-going   PT LONG TERM GOAL #4   Title Patient ambulates 200' with rolling walker & prosthesis with supervision to enable community mobility.  (Target Date 02/14/2015)   Time 2   Period Months   Status On-going   PT LONG TERM GOAL #5  Title Patient negotiates stairs, ramps & curbs with rolling walker & prosthesis with supervision to enable community mobility.  (Target Date: 02/14/2015)   Time 2   Period Months   Status On-going   PT  LONG TERM GOAL #6   Title Patient ambulates household around furniture with rolling walker & prosthesis modified independent.  (Target Date: 11/4//2016)   Time 2   Period Months   Status On-going               Plan - 01/15/15 1150    Clinical Impression Statement Patient and aide seem to understand car transfers better now. They were attempting car transfers wihout RW so patient was not standing up & prosthetic knee not extended so not supporting him. Car transfers seem safer with further instruction. Patient needs furher work with rollator walker gait but appears can learn to use to allow going further in community with ability to rest.   Pt will benefit from skilled therapeutic intervention in order to improve on the following deficits Abnormal gait;Decreased activity tolerance;Decreased balance;Decreased endurance;Decreased knowledge of use of DME;Decreased mobility;Decreased range of motion;Decreased skin integrity;Decreased strength;Postural dysfunction;Prosthetic Dependency   Rehab Potential Good   PT Frequency 2x / week   PT Duration Other (comment)  9 weeks (60 days)   PT Treatment/Interventions ADLs/Self Care Home Management;DME Instruction;Gait training;Stair training;Functional mobility training;Therapeutic activities;Therapeutic exercise;Balance training;Neuromuscular re-education;Patient/family education;Prosthetic Training;Manual techniques   PT Next Visit Plan gait with Rolllator walker & prosthesis, ramps & curbs, household with standard RW   Consulted and Agree with Plan of Care Patient        Problem List Patient Active Problem List   Diagnosis Date Noted  . Status post above knee amputation of left lower extremity ([place]) 10/18/2014  . Tachyarrhythmia 10/05/2014  . Aspiration pneumonia ([place]) 10/05/2014  . UTI (lower urinary tract infection) 10/03/2014  . Sepsis due to other etiology ([place]) 10/03/2014  . Gangrene of toe ([place]) 10/03/2014  . Ischemic ulcer of left  foot ([place]) 10/03/2014  . Atherosclerotic PVD with ulceration ([place]) 10/03/2014  . Warfarin-induced coagulopathy ([place]) 10/03/2014  . Atrial fibrillation with RVR ([place]) 10/03/2014  . Acute kidney injury ([place]) 10/03/2014  . Hyperkalemia 10/03/2014  . Cellulitis of left leg 10/03/2014  . PAD (peripheral artery disease) ([place]) 09/24/2014  . NSTEMI (non-ST elevated myocardial infarction) ([place]) 10/09/2013  . High risk medication use 05/17/2013  . BPH (benign prostatic hyperplasia) 05/17/2013  . Vitamin D deficiency 05/17/2013  . History of pulmonary embolism (2014) 04/17/2013  . NSVT (nonsustained ventricular tachycardia)- not an ICD candidate 04/17/2013  . NICM (nonischemic cardiomyopathy)- EF 20-25% by Echo 04/12/13 04/17/2013  . Chronic diastolic heart failure, NYHA class 1 ([place]) 03/15/2013  . DVT of lower extremity, bilateral ([place]) 03/15/2013  . Fatigue 11/02/2012  . Constipation 11/02/2012  . Alcohol abuse 08/27/2012  . Syncope 06/21/2012  . Bladder cancer ([place]) 03/15/2012  . Benign localized hyperplasia of prostate with urinary obstruction 03/15/2012  . Benign essential HTN   . Hyperlipidemia   . PVCs (premature ventricular contractions)   . CAD- non obstructive disease by cath 3/14     Kayen Grabel  PT, DPT  01/15/2015, 7:57 PM  [facility] [address] [place], [place], [address] Phone: [phone]   Fax:  [phone]

## 2015-01-16 LAB — SPECIMEN STATUS REPORT

## 2015-01-16 LAB — URIC ACID: Uric Acid: 3.7 mg/dL (ref 3.7–8.6)

## 2015-01-21 ENCOUNTER — Other Ambulatory Visit (INDEPENDENT_AMBULATORY_CARE_PROVIDER_SITE_OTHER): Payer: Medicare Other

## 2015-01-21 ENCOUNTER — Ambulatory Visit: Payer: Medicare Other | Admitting: Physical Therapy

## 2015-01-21 DIAGNOSIS — R600 Localized edema: Secondary | ICD-10-CM | POA: Diagnosis not present

## 2015-01-21 DIAGNOSIS — I2699 Other pulmonary embolism without acute cor pulmonale: Secondary | ICD-10-CM

## 2015-01-21 DIAGNOSIS — I82403 Acute embolism and thrombosis of unspecified deep veins of lower extremity, bilateral: Secondary | ICD-10-CM

## 2015-01-21 LAB — POCT INR: INR: 2.5

## 2015-01-21 NOTE — Patient Instructions (Signed)
Pt PT/ INR -- 2.5 - to stay on current same dose

## 2015-01-21 NOTE — Progress Notes (Signed)
Lab only 

## 2015-01-22 ENCOUNTER — Telehealth: Payer: Self-pay | Admitting: Family Medicine

## 2015-01-22 LAB — BMP8+EGFR
BUN/Creatinine Ratio: 18 (ref 10–22)
BUN: 20 mg/dL (ref 10–36)
CO2: 28 mmol/L (ref 18–29)
Calcium: 9.5 mg/dL (ref 8.6–10.2)
Chloride: 100 mmol/L (ref 97–108)
Creatinine, Ser: 1.14 mg/dL (ref 0.76–1.27)
GFR calc Af Amer: 65 mL/min/{1.73_m2} (ref >59)
GFR calc non Af Amer: 56 mL/min/{1.73_m2} — ABNORMAL LOW (ref >59)
Glucose: 103 mg/dL — ABNORMAL HIGH (ref 65–99)
Potassium: 3.8 mmol/L (ref 3.5–5.2)
Sodium: 141 mmol/L (ref 134–144)

## 2015-01-22 NOTE — Addendum Note (Signed)
Addended by: Jamelle Haring on: 01/22/2015 02:49 PM   Modules accepted: Orders

## 2015-01-22 NOTE — Telephone Encounter (Signed)
Pt aware of labs under lab encounter

## 2015-01-23 ENCOUNTER — Other Ambulatory Visit: Payer: Self-pay

## 2015-01-23 ENCOUNTER — Other Ambulatory Visit: Payer: Self-pay | Admitting: Pharmacist

## 2015-01-23 ENCOUNTER — Encounter: Payer: Medicare Other | Admitting: Physical Therapy

## 2015-01-23 DIAGNOSIS — I2510 Atherosclerotic heart disease of native coronary artery without angina pectoris: Secondary | ICD-10-CM

## 2015-01-23 MED ORDER — METOPROLOL SUCCINATE ER 50 MG PO TB24: 50.0000 mg | ORAL_TABLET | Freq: Every day | ORAL | Status: DC

## 2015-01-27 ENCOUNTER — Encounter: Payer: Self-pay | Admitting: Physical Therapy

## 2015-01-27 ENCOUNTER — Ambulatory Visit: Payer: Medicare Other | Admitting: Physical Therapy

## 2015-01-27 DIAGNOSIS — Z4789 Encounter for other orthopedic aftercare: Secondary | ICD-10-CM

## 2015-01-27 DIAGNOSIS — R269 Unspecified abnormalities of gait and mobility: Secondary | ICD-10-CM | POA: Diagnosis not present

## 2015-01-27 DIAGNOSIS — R531 Weakness: Secondary | ICD-10-CM | POA: Diagnosis not present

## 2015-01-27 DIAGNOSIS — R29818 Other symptoms and signs involving the nervous system: Secondary | ICD-10-CM | POA: Diagnosis not present

## 2015-01-27 DIAGNOSIS — Z89612 Acquired absence of left leg above knee: Secondary | ICD-10-CM | POA: Diagnosis not present

## 2015-01-27 DIAGNOSIS — R2689 Other abnormalities of gait and mobility: Secondary | ICD-10-CM

## 2015-01-27 DIAGNOSIS — R2681 Unsteadiness on feet: Secondary | ICD-10-CM

## 2015-01-27 DIAGNOSIS — R6889 Other general symptoms and signs: Secondary | ICD-10-CM | POA: Diagnosis not present

## 2015-01-27 NOTE — Therapy (Signed)
[facility] [address] [place], [place], [address] Phone: [phone]   Fax:  [phone]  Physical Therapy Treatment  Patient Details  Name: Scott [name] MRN: [id number] Date of Birth: 1922/04/12 No Data Recorded  Encounter Date: 01/27/2015  Physical Therapy Progress Note  Dates of Reporting Period: 12/17/2014 to 01/27/2015  Objective Reports of Subjective Statement: Patient reports wearing prosthesis >90% of awake hours. Patient ambulates with RW & prsothesis at home with aides.  Objective Measurements: See below  Goal Update: See below  Plan: See below  Reason Skilled Services are Required: Patient needs skilled instruction in safe use of prosthesis to progress mobility to community level.         PT End of Session - 01/27/15 1400    Visit Number 10   Number of Visits 18   Date for PT Re-Evaluation 02/14/15   Authorization Type Medicare Do G-code every 10th visit   PT Start Time 1400   PT Stop Time 1445   PT Time Calculation (min) 45 min   Equipment Utilized During Treatment Gait belt   Activity Tolerance Patient tolerated treatment well   Behavior During Therapy WFL for tasks assessed/performed      Past Medical History  Diagnosis Date  . HTN (hypertension)   . Hyperlipidemia   . PVCs (premature ventricular contractions)   . Glaucoma   . PVD (peripheral vascular disease) (HCC)     Right ABI .75, Left .78 (2006)  . Popliteal artery aneurysm, bilateral (HCC) DECUMENTED CHRONIC PARTIAL OCCLUSION--  PT DENIES CLAUDICATION OR ANY OTHER SYMPTOMS  . Frequency of urination   . Nocturia   . DVT, bilateral lower limbs ([place]) 03/2013    a. Bilat DVT dx 03/2013.  . Pulmonary embolism ([place])     a. By CT angio 04/2013.  Marland Kitchen Elevated troponin     a. Adm 12/30-04/2013 - not felt to represent ACS; ?due to PE. b. Patent cors 06/2012.  Marland Kitchen NSVT (nonsustained ventricular tachycardia) ([place])     a. NSVT 06/2012; NSVT  also seen during 03/2013 adm. b. Med rx. Not candidate for ICD given adv age.  Marland Kitchen ETOH abuse   . Syncope     a. Felt to be postural syncope related to diuretics 06/2012.  Marland Kitchen Heart murmur   . DVT (deep venous thrombosis) ([place])     "I think in both legs"  . Pneumonia     "couple times"  . DDD (degenerative disc disease)   . Arthritis     "all over"  . Depression   . CAD- non obstructive disease by cath 3/14   . PAD (peripheral artery disease) ([place])   . Gangrene of toe ([place]) 10/03/2014  . Sepsis due to other etiology ([place]) 10/03/2014  . Chronic systolic heart failure ([place]) 10/04/14    EF improved 50-55%  . Chronic systolic CHF (congestive heart failure) ([place])     a. NICM - patent cors 06/2012, EF 40% at that time. b. 03/2013 eval: EF 20-25%.  . Chronic lower back pain   . Bladder cancer ([place]) 03/15/2012    pt denies this hx on 10/15/2014    Past Surgical History  Procedure Laterality Date  . Transthoracic echocardiogram  09-22-2010    MODERATE LVH/ EF 02%/ GRADE I DIASTOLIC DYSFUNCTION/ AORTIC SCLEROSIS WITHOUT STENOSIS/  RV  SYSTOLIC  MILDLY REDUCED FUNCTION  . Cardiovascular stress test  10-15-2010    LOW RISK NUCLEAR STUDY/ NO EVIDENCE OF ISCHEMIA/ NORMAL EF  .  Shoulder surgery  1970's    "don't remember which side or what kind of OR"  . Transurethral resection of bladder tumor  10/07/2011    Procedure: TRANSURETHRAL RESECTION OF BLADDER TUMOR (TURBT);  Surgeon: Malka So, MD;  Location: [facility];  Service: Urology;  Laterality: N/A;  . Cystoscopy  10/07/2011    Procedure: CYSTOSCOPY;  Surgeon: Malka So, MD;  Location[facility];  Service: Urology;  Laterality: N/A;  . Cystoscopy with biopsy  03/14/2012    Procedure: CYSTOSCOPY WITH BIOPSY;  Surgeon: Malka So, MD;  Location: [facility];  Service: Urology;  Laterality: N/A;  WITH FULGURATION  . Transurethral resection of prostate  03/14/2012    Procedure: TRANSURETHRAL RESECTION OF THE PROSTATE WITH  GYRUS INSTRUMENTS;  Surgeon: Malka So, MD;  Location: [facility];  Service: Urology;  Laterality: N/A;  . Tonsillectomy    . Cataract extraction w/ intraocular lens  implant, bilateral Bilateral   . Cardiac catheterization  05-11-2004  DR Moreland PLAQUE/ NORMAL LVF/ EF 55%/  NON-OBSTRUCTIVE LAD 25%  . Cardiac catheterization  06/2012  . Eye surgery    . Amputation Left 10/16/2014    Procedure: AMPUTATION ABOVE KNEE- LEFT;  Surgeon: Mal Misty, MD;  Location: [facility];  Service: Vascular;  Laterality: Left;    There were no vitals filed for this visit.  Visit Diagnosis:  Functional weakness  Abnormality of gait  Decreased functional activity tolerance  Unsteadiness  Balance problems  Status post above knee amputation of left lower extremity ([place])  Encounter for prosthetic gait training      Subjective Assessment - 01/27/15 1409    Subjective He fell when he tried to use rollator walker by himself. Denies injuries. He got in w/c with help of aide.   Currently in Pain? No/denies                         [facility] Adult PT Treatment/Exercise - 01/27/15 1400    Transfers   Sit to Stand 4: Min assist;With upper extremity assist;With armrests;From chair/3-in-1;5: Supervision  MinA from rollator walker seat; SBA from w/c   Sit to Stand Details Verbal cues for technique;Verbal cues for sequencing;Verbal cues for precautions/safety;Manual facilitation for weight shifting;Verbal cues for safe use of DME/AE   Sit to Stand Details (indicate cue type and reason) cues on technique / safety with rollator   Stand to Sit 4: Min assist;With upper extremity assist;With armrests;To chair/3-in-1;5: Supervision  MinA to rollator walker seat; SBA to w/c   Stand to Sit Details (indicate cue type and reason) Verbal cues for precautions/safety;Verbal cues for safe use of DME/AE   Stand to Sit Details verbal cues on rollator management / safety   Ambulation/Gait    Ambulation/Gait Yes   Ambulation/Gait Assistance 4: Min assist;4: Min guard  MInA with rollator walker & Min guard with RW   Ambulation/Gait Assistance Details PT demo, reviewed rollator walker safety, brake management, verbal cues on prosthesis step length   Ambulation Distance (Feet) 125 Feet  125' with RW, 100' & 125' with rollator walker    Assistive device Prosthesis;Rollator;Rolling walker   Gait Pattern Step-to pattern;Decreased step length - right;Decreased stance time - left;Decreased stride length;Decreased hip/knee flexion - left;Decreased weight shift to left;Left hip hike;Left circumduction;Trunk flexed;Abducted - left;Poor foot clearance - left   Ambulation Surface Indoor;Level   Ramp 4: Min assist  rollator walker & prosthesis  Ramp Details (indicate cue type and reason) verbal cues on rollator management & prosthetic knee control   Curb 4: Min assist  RW & prosthesis   Curb Details (indicate cue type and reason) verbal cues on technique including foot placement   Gait Comments turning 180* to position to sit on rollator & return to walking / standing up   Prosthetics   Prosthetic Care Comments  Patient's prosthesis has permanent socket & cover that he reports he got on Thursday, 10/13. PT instructed to store prosthesis overnight with knee flexed to stretch the cover which can make knee stiffer until material is stretched. Pt verbalized understanding.   Current prosthetic wear tolerance (days/week)  daily   Current prosthetic wear tolerance (#hours/day)  wearing >90% of awake hours  Patient naps in the afternoon & removes prothesis   Residual limb condition  intact   Education Provided Correct ply sock adjustment;Proper Donning   Person(s) Educated Patient   Education Method Explanation;Verbal cues   Education Method Verbalized understanding;Verbal cues required   Donning Prosthesis Minimal assist   Doffing Prosthesis Supervision                  PT Short Term  Goals - 01/13/15 1445    PT SHORT TERM GOAL #1   Title Patient donnes prosthesis correctly modified independent. (Target Date: 01/15/2015)   Time 1   Period Months   Status On-going   PT SHORT TERM GOAL #2   Title Patient tolerates wear >10hrs total per day without skin issues or limb pain.  (Target Date: 01/15/2015)   Baseline MET 01/13/2015 Patient reports wear 5hrs 2x/day with  skin issues or limb pain.    Time 1   Period Months   Status Achieved   PT SHORT TERM GOAL #3   Title Patient performs standing balance with RW & prosthesis reaching 10", to floor and manages pants for toileting modified inependent.  (Target Date: 01/15/2015)   Baseline Partially MET 01/13/2015 Patient performs standing balance with RW & prosthesis reaching 10", to floor and simulates managing pants with supervision.    Time 1   Period Months   Status Partially Met   PT SHORT TERM GOAL #4   Title Patient ambulates 200' with RW & prosthesis with SBA.  (Target Date: 01/15/2015)   Baseline NOT MET 01/13/2015 Patient ambulates 125' with RW & prosthesis with minimal assist.    Time 1   Period Months   Status Not Met   PT SHORT TERM GOAL #5   Title Patient negotiates stairs (2 rails), ramps & curbs with RW & prosthesis with Minimal assist.  (Target Date: 01/15/2015)   Time 1   Period Months   Status On-going           PT Long Term Goals - 01/15/15 1150    PT LONG TERM GOAL #1   Title Patient verbalizes proper prosthetic care including donning.  (Target Date: 02/14/2015)   Time 2   Period Months   Status On-going   PT LONG TERM GOAL #2   Title Patient tolerates wear of prosthesis >80% of awake hours without skin issues or limb pain.  (Target Date 02/14/2015)   Time 2   Period Months   Status On-going   PT LONG TERM GOAL #3   Title Berg Balance >25/56  (Target Date: 02/14/2015)   Time 2   Period Months   Status On-going   PT LONG TERM GOAL #4   Title Patient ambulates 200'  with rolling walker & prosthesis  with supervision to enable community mobility.  (Target Date 02/14/2015)   Time 2   Period Months   Status On-going   PT LONG TERM GOAL #5   Title Patient negotiates stairs, ramps & curbs with rolling walker & prosthesis with supervision to enable community mobility.  (Target Date: 02/14/2015)   Time 2   Period Months   Status On-going   PT LONG TERM GOAL #6   Title Patient ambulates household around furniture with rolling walker & prosthesis modified independent.  (Target Date: 11/4//2016)   Time 2   Period Months   Status On-going               Plan - 2015/02/23 1400    Clinical Impression Statement After discussion with patient, he plans to use rollator walker to walk in/out of his home and possibly to mailbox which includes a ramp & slight incline to driveway. When going into community with curbs, he plans to use standard RW. Patient is on target to meet LTGs during certification period.    Pt will benefit from skilled therapeutic intervention in order to improve on the following deficits Abnormal gait;Decreased activity tolerance;Decreased balance;Decreased endurance;Decreased knowledge of use of DME;Decreased mobility;Decreased range of motion;Decreased skin integrity;Decreased strength;Postural dysfunction;Prosthetic Dependency   Rehab Potential Good   PT Frequency 2x / week   PT Duration Other (comment)  9 weeks (60 days)   PT Treatment/Interventions ADLs/Self Care Home Management;DME Instruction;Gait training;Stair training;Functional mobility training;Therapeutic activities;Therapeutic exercise;Balance training;Neuromuscular re-education;Patient/family education;Prosthetic Training;Manual techniques   PT Next Visit Plan gait with Rolllator walker & prosthesis on ramps &  standard RW on curbs, household with standard RW   Consulted and Agree with Plan of Care Patient          G-Codes - 02/23/15 1400    Functional Assessment Tool Used MInA to donne prosthesis; cues on  adjusting ply socks. Patient tolerates wear >90% of awake hours without issues. Patient ambulates 125' with RW & prosthesis with minA   Functional Limitation Mobility: Walking and moving around   Mobility: Walking and Moving Around Current Status [id number]) At least 60 percent but less than 80 percent impaired, limited or restricted   Mobility: Walking and Moving Around Goal Status [id number]) At least 40 percent but less than 60 percent impaired, limited or restricted      Problem List Patient Active Problem List   Diagnosis Date Noted  . Status post above knee amputation of left lower extremity ([place]) 10/18/2014  . Tachyarrhythmia 10/05/2014  . Aspiration pneumonia ([place]) 10/05/2014  . UTI (lower urinary tract infection) 10/03/2014  . Sepsis due to other etiology ([place]) 10/03/2014  . Gangrene of toe ([place]) 10/03/2014  . Ischemic ulcer of left foot ([place]) 10/03/2014  . Atherosclerotic PVD with ulceration ([place]) 10/03/2014  . Warfarin-induced coagulopathy ([place]) 10/03/2014  . Atrial fibrillation with RVR ([place]) 10/03/2014  . Acute kidney injury ([place]) 10/03/2014  . Hyperkalemia 10/03/2014  . Cellulitis of left leg 10/03/2014  . PAD (peripheral artery disease) ([place]) 09/24/2014  . NSTEMI (non-ST elevated myocardial infarction) ([place]) 10/09/2013  . High risk medication use 05/17/2013  . BPH (benign prostatic hyperplasia) 05/17/2013  . Vitamin D deficiency 05/17/2013  . History of pulmonary embolism (2014) 04/17/2013  . NSVT (nonsustained ventricular tachycardia)- not an ICD candidate 04/17/2013  . NICM (nonischemic cardiomyopathy)- EF 20-25% by Echo 04/12/13 04/17/2013  . Chronic diastolic heart failure, NYHA class 1 ([place]) 03/15/2013  . DVT of lower extremity, bilateral ([place])  03/15/2013  . Fatigue 11/02/2012  . Constipation 11/02/2012  . Alcohol abuse 08/27/2012  . Syncope 06/21/2012  . Bladder cancer ([place]) 03/15/2012  . Benign localized hyperplasia of prostate with urinary obstruction 03/15/2012  .  Benign essential HTN   . Hyperlipidemia   . PVCs (premature ventricular contractions)   . CAD- non obstructive disease by cath 3/14     Ellianne Gowen PT, DPT 01/27/2015, 9:40 PM  [facility] [address] [place], [place], [address] Phone: [phone]   Fax:  [phone]  Name: Scott [name] MRN: [id number] Date of Birth: March 23, 1922

## 2015-01-30 ENCOUNTER — Ambulatory Visit: Payer: Medicare Other | Admitting: Physical Therapy

## 2015-01-30 ENCOUNTER — Encounter: Payer: Self-pay | Admitting: Physical Therapy

## 2015-01-30 DIAGNOSIS — R6889 Other general symptoms and signs: Secondary | ICD-10-CM | POA: Diagnosis not present

## 2015-01-30 DIAGNOSIS — Z4789 Encounter for other orthopedic aftercare: Secondary | ICD-10-CM

## 2015-01-30 DIAGNOSIS — R269 Unspecified abnormalities of gait and mobility: Secondary | ICD-10-CM | POA: Diagnosis not present

## 2015-01-30 DIAGNOSIS — R531 Weakness: Secondary | ICD-10-CM | POA: Diagnosis not present

## 2015-01-30 DIAGNOSIS — R2689 Other abnormalities of gait and mobility: Secondary | ICD-10-CM

## 2015-01-30 DIAGNOSIS — R2681 Unsteadiness on feet: Secondary | ICD-10-CM | POA: Diagnosis not present

## 2015-01-30 DIAGNOSIS — R29818 Other symptoms and signs involving the nervous system: Secondary | ICD-10-CM | POA: Diagnosis not present

## 2015-01-30 DIAGNOSIS — Z89612 Acquired absence of left leg above knee: Secondary | ICD-10-CM | POA: Diagnosis not present

## 2015-01-31 ENCOUNTER — Telehealth: Payer: Self-pay | Admitting: Family Medicine

## 2015-01-31 ENCOUNTER — Other Ambulatory Visit: Payer: Self-pay | Admitting: *Deleted

## 2015-01-31 ENCOUNTER — Other Ambulatory Visit (INDEPENDENT_AMBULATORY_CARE_PROVIDER_SITE_OTHER): Payer: Medicare Other

## 2015-01-31 DIAGNOSIS — R319 Hematuria, unspecified: Secondary | ICD-10-CM | POA: Diagnosis not present

## 2015-01-31 DIAGNOSIS — Z7901 Long term (current) use of anticoagulants: Secondary | ICD-10-CM | POA: Diagnosis not present

## 2015-01-31 DIAGNOSIS — I482 Chronic atrial fibrillation: Secondary | ICD-10-CM | POA: Diagnosis not present

## 2015-01-31 LAB — POCT UA - MICROSCOPIC ONLY
Casts, Ur, LPF, POC: NEGATIVE
Crystals, Ur, HPF, POC: NEGATIVE
Mucus, UA: NEGATIVE
WBC, Ur, HPF, POC: NEGATIVE
Yeast, UA: NEGATIVE

## 2015-01-31 LAB — POCT URINALYSIS DIPSTICK
Bilirubin, UA: NEGATIVE
Glucose, UA: NEGATIVE
Ketones, UA: NEGATIVE
Leukocytes, UA: NEGATIVE
Nitrite, UA: NEGATIVE
Protein, UA: NEGATIVE
Spec Grav, UA: 1.01
Urobilinogen, UA: NEGATIVE
pH, UA: 6

## 2015-01-31 LAB — POCT INR: INR: 2.7

## 2015-01-31 MED ORDER — GABAPENTIN 100 MG PO CAPS: ORAL_CAPSULE | ORAL | Status: DC

## 2015-01-31 NOTE — Progress Notes (Signed)
Lab only 

## 2015-01-31 NOTE — Telephone Encounter (Signed)
Patient caretaker aware to bring urine by for [place] to check.

## 2015-02-02 LAB — URINE CULTURE

## 2015-02-02 NOTE — Therapy (Signed)
[facility] [address] [place], [place], [address] Phone: [phone]   Fax:  [phone]  Physical Therapy Treatment  Patient Details  Name: Scott [name] MRN: [id number] Date of Birth: 1921-10-02 No Data Recorded  Encounter Date: 01/30/2015   01/30/15 1504  PT Visits / Re-Eval  Visit Number 11  Number of Visits 18  Date for PT Re-Evaluation 02/14/15  Authorization  Authorization Type Medicare Do G-code every 10th visit  PT Time Calculation  PT Start Time 1500  PT Stop Time 1530  PT Time Calculation (min) 30 min  PT - End of Session  Equipment Utilized During Treatment Gait belt  Activity Tolerance Patient tolerated treatment well  Behavior During Therapy [facility] for tasks assessed/performed     Past Medical History  Diagnosis Date  . HTN (hypertension)   . Hyperlipidemia   . PVCs (premature ventricular contractions)   . Glaucoma   . PVD (peripheral vascular disease) (HCC)     Right ABI .75, Left .78 (2006)  . Popliteal artery aneurysm, bilateral (HCC) DECUMENTED CHRONIC PARTIAL OCCLUSION--  PT DENIES CLAUDICATION OR ANY OTHER SYMPTOMS  . Frequency of urination   . Nocturia   . DVT, bilateral lower limbs ([place]) 03/2013    a. Bilat DVT dx 03/2013.  . Pulmonary embolism ([place])     a. By CT angio 04/2013.  Marland Kitchen Elevated troponin     a. Adm 12/30-04/2013 - not felt to represent ACS; ?due to PE. b. Patent cors 06/2012.  Marland Kitchen NSVT (nonsustained ventricular tachycardia) ([place])     a. NSVT 06/2012; NSVT also seen during 03/2013 adm. b. Med rx. Not candidate for ICD given adv age.  Marland Kitchen ETOH abuse   . Syncope     a. Felt to be postural syncope related to diuretics 06/2012.  Marland Kitchen Heart murmur   . DVT (deep venous thrombosis) ([place])     "I think in both legs"  . Pneumonia     "couple times"  . DDD (degenerative disc disease)   . Arthritis     "all over"  . Depression   . CAD- non obstructive disease by cath 3/14   . PAD  (peripheral artery disease) ([place])   . Gangrene of toe ([place]) 10/03/2014  . Sepsis due to other etiology ([place]) 10/03/2014  . Chronic systolic heart failure ([place]) 10/04/14    EF improved 50-55%  . Chronic systolic CHF (congestive heart failure) ([place])     a. NICM - patent cors 06/2012, EF 40% at that time. b. 03/2013 eval: EF 20-25%.  . Chronic lower back pain   . Bladder cancer ([place]) 03/15/2012    pt denies this hx on 10/15/2014    Past Surgical History  Procedure Laterality Date  . Transthoracic echocardiogram  09-22-2010    MODERATE LVH/ EF 70%/ GRADE I DIASTOLIC DYSFUNCTION/ AORTIC SCLEROSIS WITHOUT STENOSIS/  RV  SYSTOLIC  MILDLY REDUCED FUNCTION  . Cardiovascular stress test  10-15-2010    LOW RISK NUCLEAR STUDY/ NO EVIDENCE OF ISCHEMIA/ NORMAL EF  . Shoulder surgery  1970's    "don't remember which side or what kind of OR"  . Transurethral resection of bladder tumor  10/07/2011    Procedure: TRANSURETHRAL RESECTION OF BLADDER TUMOR (TURBT);  Surgeon: Malka So, MD;  Location: [facility];  Service: Urology;  Laterality: N/A;  . Cystoscopy  10/07/2011    Procedure: CYSTOSCOPY;  Surgeon: Malka So, MD;  Location: [facility];  Service:  Urology;  Laterality: N/A;  . Cystoscopy with biopsy  03/14/2012    Procedure: CYSTOSCOPY WITH BIOPSY;  Surgeon: Malka So, MD;  Location: [facility];  Service: Urology;  Laterality: N/A;  WITH FULGURATION  . Transurethral resection of prostate  03/14/2012    Procedure: TRANSURETHRAL RESECTION OF THE PROSTATE WITH GYRUS INSTRUMENTS;  Surgeon: Malka So, MD;  Location: [facility];  Service: Urology;  Laterality: N/A;  . Tonsillectomy    . Cataract extraction w/ intraocular lens  implant, bilateral Bilateral   . Cardiac catheterization  05-11-2004  DR [place] PLAQUE/ NORMAL LVF/ EF 55%/  NON-OBSTRUCTIVE LAD 25%  . Cardiac catheterization  06/2012  . Eye surgery    . Amputation Left 10/16/2014    Procedure:  AMPUTATION ABOVE KNEE- LEFT;  Surgeon: Mal Misty, MD;  Location: [facility];  Service: Vascular;  Laterality: Left;    There were no vitals filed for this visit.  Visit Diagnosis:  Functional weakness  Abnormality of gait  Decreased functional activity tolerance  Unsteadiness  Balance problems  Encounter for prosthetic gait training      Subjective Assessment - 02/02/15 2230    Subjective No new complaints. Running late today due to transportation. No pain or new falls to report.   Pain Score 0-No pain        01/30/15 1505  Transfers  Sit to Stand 4: Min guard;4: Min assist;With armrests;From chair/3-in-1;With upper extremity assist  Sit to Stand Details Verbal cues for technique;Verbal cues for sequencing;Verbal cues for precautions/safety;Verbal cues for safe use of DME/AE;Manual facilitation for weight shifting  Stand to Sit 4: Min guard;4: Min assist;With armrests;To chair/3-in-1;With upper extremity assist  Stand to Sit Details (indicate cue type and reason) Verbal cues for precautions/safety;Verbal cues for safe use of DME/AE  Ambulation/Gait  Ambulation/Gait Yes  Ambulation/Gait Assistance 4: Min guard;4: Min assist  Ambulation/Gait Assistance Details occasional scuffing with prosthesis noted creating knee instability needing up to min assist for stability.                      Ambulation Distance (Feet) 125 Feet (x1, 180 feet)  Assistive device Rolling walker;Prosthesis  Gait Pattern Step-to pattern;Decreased step length - right;Decreased stance time - left;Decreased stride length;Decreased hip/knee flexion - left;Decreased weight shift to left;Left hip hike;Left circumduction;Trunk flexed;Abducted - left;Poor foot clearance - left  Ambulation Surface Level;Indoor  Ramp 4: Min assist  Ramp Details (indicate cue type and reason) cues on posture and sequence  Curb 4: Min assist  Curb Details (indicate cue type and reason) cues on posture and sequence  Prosthetics   Current prosthetic wear tolerance (days/week)  daily  Current prosthetic wear tolerance (#hours/day)  wearing >90% of awake hours  Residual limb condition  intact  Education Provided Residual limb care;Correct ply sock adjustment;Proper wear schedule/adjustment;Proper weight-bearing schedule/adjustment  Person(s) Educated Patient  Education Method Explanation;Demonstration;Verbal cues  Education Method Verbalized understanding;Verbal cues required;Needs further instruction  Donning Prosthesis 4  Doffing Prosthesis 5         PT Short Term Goals - 01/13/15 1445    PT SHORT TERM GOAL #1   Title Patient donnes prosthesis correctly modified independent. (Target Date: 01/15/2015)   Time 1   Period Months   Status On-going   PT SHORT TERM GOAL #2   Title Patient tolerates wear >10hrs total per day without skin issues or limb pain.  (Target Date: 01/15/2015)   Baseline MET 01/13/2015 Patient reports  wear 5hrs 2x/day with  skin issues or limb pain.    Time 1   Period Months   Status Achieved   PT SHORT TERM GOAL #3   Title Patient performs standing balance with RW & prosthesis reaching 10", to floor and manages pants for toileting modified inependent.  (Target Date: 01/15/2015)   Baseline Partially MET 01/13/2015 Patient performs standing balance with RW & prosthesis reaching 10", to floor and simulates managing pants with supervision.    Time 1   Period Months   Status Partially Met   PT SHORT TERM GOAL #4   Title Patient ambulates 200' with RW & prosthesis with SBA.  (Target Date: 01/15/2015)   Baseline NOT MET 01/13/2015 Patient ambulates 125' with RW & prosthesis with minimal assist.    Time 1   Period Months   Status Not Met   PT SHORT TERM GOAL #5   Title Patient negotiates stairs (2 rails), ramps & curbs with RW & prosthesis with Minimal assist.  (Target Date: 01/15/2015)   Time 1   Period Months   Status On-going           PT Long Term Goals - 01/15/15 1150    PT LONG TERM  GOAL #1   Title Patient verbalizes proper prosthetic care including donning.  (Target Date: 02/14/2015)   Time 2   Period Months   Status On-going   PT LONG TERM GOAL #2   Title Patient tolerates wear of prosthesis >80% of awake hours without skin issues or limb pain.  (Target Date 02/14/2015)   Time 2   Period Months   Status On-going   PT LONG TERM GOAL #3   Title [place] Balance >25/56  (Target Date: 02/14/2015)   Time 2   Period Months   Status On-going   PT LONG TERM GOAL #4   Title Patient ambulates 200' with rolling walker & prosthesis with supervision to enable community mobility.  (Target Date 02/14/2015)   Time 2   Period Months   Status On-going   PT LONG TERM GOAL #5   Title Patient negotiates stairs, ramps & curbs with rolling walker & prosthesis with supervision to enable community mobility.  (Target Date: 02/14/2015)   Time 2   Period Months   Status On-going   PT LONG TERM GOAL #6   Title Patient ambulates household around furniture with rolling walker & prosthesis modified independent.  (Target Date: 11/4//2016)   Time 2   Period Months   Status On-going        01/30/15 1504  Plan  Clinical Impression Statement Continued to work on gait with rollator and walker toward goals. Still needs occasional cues on knee control. Pt making steady progress toward goals.l  Pt will benefit from skilled therapeutic intervention in order to improve on the following deficits Abnormal gait;Decreased activity tolerance;Decreased balance;Decreased endurance;Decreased knowledge of use of DME;Decreased mobility;Decreased range of motion;Decreased skin integrity;Decreased strength;Postural dysfunction;Prosthetic Dependency  Rehab Potential Good  PT Frequency 2x / week  PT Duration Other (comment) (9 weeks (60 days))  PT Treatment/Interventions ADLs/Self Care Home Management;DME Instruction;Gait training;Stair training;Functional mobility training;Therapeutic activities;Therapeutic  exercise;Balance training;Neuromuscular re-education;Patient/family education;Prosthetic Training;Manual techniques  PT Next Visit Plan gait with Rolllator walker & prosthesis on ramps &  standard RW on curbs, household with standard RW  Consulted and Agree with Plan of Care Patient    Problem List Patient Active Problem List   Diagnosis Date Noted  . Status post above knee amputation of left  lower extremity ([place]) 10/18/2014  . Tachyarrhythmia 10/05/2014  . Aspiration pneumonia ([place]) 10/05/2014  . UTI (lower urinary tract infection) 10/03/2014  . Sepsis due to other etiology ([place]) 10/03/2014  . Gangrene of toe ([place]) 10/03/2014  . Ischemic ulcer of left foot ([place]) 10/03/2014  . Atherosclerotic PVD with ulceration ([place]) 10/03/2014  . Warfarin-induced coagulopathy ([place]) 10/03/2014  . Atrial fibrillation with RVR ([place]) 10/03/2014  . Acute kidney injury ([place]) 10/03/2014  . Hyperkalemia 10/03/2014  . Cellulitis of left leg 10/03/2014  . PAD (peripheral artery disease) ([place]) 09/24/2014  . NSTEMI (non-ST elevated myocardial infarction) ([place]) 10/09/2013  . High risk medication use 05/17/2013  . BPH (benign prostatic hyperplasia) 05/17/2013  . Vitamin D deficiency 05/17/2013  . History of pulmonary embolism (2014) 04/17/2013  . NSVT (nonsustained ventricular tachycardia)- not an ICD candidate 04/17/2013  . NICM (nonischemic cardiomyopathy)- EF 20-25% by Echo 04/12/13 04/17/2013  . Chronic diastolic heart failure, NYHA class 1 ([place]) 03/15/2013  . DVT of lower extremity, bilateral ([place]) 03/15/2013  . Fatigue 11/02/2012  . Constipation 11/02/2012  . Alcohol abuse 08/27/2012  . Syncope 06/21/2012  . Bladder cancer ([place]) 03/15/2012  . Benign localized hyperplasia of prostate with urinary obstruction 03/15/2012  . Benign essential HTN   . Hyperlipidemia   . PVCs (premature ventricular contractions)   . CAD- non obstructive disease by cath 3/14     Willow Ora 02/02/2015, 10:32 PM  Willow Ora, PTA, [facility] [address], [place], [place] [address] [phone] 02/02/2015, 10:32 PM   Name: Scott [name] MRN: [id number] Date of Birth: [birth year]

## 2015-02-03 ENCOUNTER — Ambulatory Visit (INDEPENDENT_AMBULATORY_CARE_PROVIDER_SITE_OTHER): Payer: Medicare Other | Admitting: Pharmacist

## 2015-02-03 ENCOUNTER — Ambulatory Visit: Payer: Medicare Other | Admitting: Physical Therapy

## 2015-02-03 DIAGNOSIS — Z86711 Personal history of pulmonary embolism: Secondary | ICD-10-CM | POA: Diagnosis not present

## 2015-02-03 DIAGNOSIS — I82403 Acute embolism and thrombosis of unspecified deep veins of lower extremity, bilateral: Secondary | ICD-10-CM

## 2015-02-03 NOTE — Progress Notes (Signed)
INR results faxed from home INR monitoring [place] Patient checks INR at home with Home INR monitoring.  Billing once per month interupertation fee.  Patient diagnosis -  History of DVT and atrial fibrillation Procedure code if G0250

## 2015-02-04 ENCOUNTER — Encounter: Payer: Medicare Other | Admitting: Physical Therapy

## 2015-02-06 ENCOUNTER — Other Ambulatory Visit: Payer: Self-pay | Admitting: *Deleted

## 2015-02-06 ENCOUNTER — Encounter: Payer: Self-pay | Admitting: Physical Therapy

## 2015-02-06 ENCOUNTER — Encounter: Payer: Self-pay | Admitting: Vascular Surgery

## 2015-02-06 ENCOUNTER — Other Ambulatory Visit: Payer: Self-pay | Admitting: Family Medicine

## 2015-02-06 ENCOUNTER — Ambulatory Visit: Payer: Medicare Other | Admitting: Physical Therapy

## 2015-02-06 DIAGNOSIS — R6889 Other general symptoms and signs: Secondary | ICD-10-CM

## 2015-02-06 DIAGNOSIS — Z89612 Acquired absence of left leg above knee: Secondary | ICD-10-CM

## 2015-02-06 DIAGNOSIS — R2689 Other abnormalities of gait and mobility: Secondary | ICD-10-CM

## 2015-02-06 DIAGNOSIS — R2681 Unsteadiness on feet: Secondary | ICD-10-CM

## 2015-02-06 DIAGNOSIS — R531 Weakness: Secondary | ICD-10-CM | POA: Diagnosis not present

## 2015-02-06 DIAGNOSIS — Z4789 Encounter for other orthopedic aftercare: Secondary | ICD-10-CM

## 2015-02-06 DIAGNOSIS — R29818 Other symptoms and signs involving the nervous system: Secondary | ICD-10-CM | POA: Diagnosis not present

## 2015-02-06 DIAGNOSIS — I70213 Atherosclerosis of native arteries of extremities with intermittent claudication, bilateral legs: Secondary | ICD-10-CM

## 2015-02-06 DIAGNOSIS — R269 Unspecified abnormalities of gait and mobility: Secondary | ICD-10-CM | POA: Diagnosis not present

## 2015-02-06 NOTE — Therapy (Signed)
[facility] [address] [place], [place], [address] Phone: [phone]   Fax:  [phone]  Physical Therapy Treatment  Patient Details  Name: Scott [name] MRN: [id number] Date of Birth: [birth year] Referring Provider: Redge Gainer, MD  Encounter Date: 02/06/2015      PT End of Session - 02/06/15 1400    Visit Number 12   Number of Visits 18   Date for PT Re-Evaluation 02/14/15   Authorization Type Medicare Do G-code every 10th visit   PT Start Time 1400   PT Stop Time 1445   PT Time Calculation (min) 45 min   Equipment Utilized During Treatment Gait belt   Activity Tolerance Patient tolerated treatment well   Behavior During Therapy [facility] for tasks assessed/performed      Past Medical History  Diagnosis Date  . HTN (hypertension)   . Hyperlipidemia   . PVCs (premature ventricular contractions)   . Glaucoma   . PVD (peripheral vascular disease) (HCC)     Right ABI .75, Left .78 (2006)  . Popliteal artery aneurysm, bilateral (HCC) DECUMENTED CHRONIC PARTIAL OCCLUSION--  PT DENIES CLAUDICATION OR ANY OTHER SYMPTOMS  . Frequency of urination   . Nocturia   . DVT, bilateral lower limbs ([place]) 03/2013    a. Bilat DVT dx 03/2013.  . Pulmonary embolism ([place])     a. By CT angio 04/2013.  Marland Kitchen Elevated troponin     a. Adm 12/30-04/2013 - not felt to represent ACS; ?due to PE. b. Patent cors 06/2012.  Marland Kitchen NSVT (nonsustained ventricular tachycardia) ([place])     a. NSVT 06/2012; NSVT also seen during 03/2013 adm. b. Med rx. Not candidate for ICD given adv age.  Marland Kitchen ETOH abuse   . Syncope     a. Felt to be postural syncope related to diuretics 06/2012.  Marland Kitchen Heart murmur   . DVT (deep venous thrombosis) ([place])     "I think in both legs"  . Pneumonia     "couple times"  . DDD (degenerative disc disease)   . Arthritis     "all over"  . Depression   . CAD- non obstructive disease by cath 3/14   . PAD (peripheral artery disease)  ([place])   . Gangrene of toe ([place]) 10/03/2014  . Sepsis due to other etiology ([place]) 10/03/2014  . Chronic systolic heart failure ([place]) 10/04/14    EF improved 50-55%  . Chronic systolic CHF (congestive heart failure) ([place])     a. NICM - patent cors 06/2012, EF 40% at that time. b. 03/2013 eval: EF 20-25%.  . Chronic lower back pain   . Bladder cancer ([place]) 03/15/2012    pt denies this hx on 10/15/2014    Past Surgical History  Procedure Laterality Date  . Transthoracic echocardiogram  09-22-2010    MODERATE LVH/ EF 17%/ GRADE I DIASTOLIC DYSFUNCTION/ AORTIC SCLEROSIS WITHOUT STENOSIS/  RV  SYSTOLIC  MILDLY REDUCED FUNCTION  . Cardiovascular stress test  10-15-2010    LOW RISK NUCLEAR STUDY/ NO EVIDENCE OF ISCHEMIA/ NORMAL EF  . Shoulder surgery  1970's    "don't remember which side or what kind of OR"  . Transurethral resection of bladder tumor  10/07/2011    Procedure: TRANSURETHRAL RESECTION OF BLADDER TUMOR (TURBT);  Surgeon: Malka So, MD;  Location: [facility];  Service: Urology;  Laterality: N/A;  . Cystoscopy  10/07/2011    Procedure: CYSTOSCOPY;  Surgeon: Malka So, MD;  Location: [facility];  Service: Urology;  Laterality: N/A;  . Cystoscopy with biopsy  03/14/2012    Procedure: CYSTOSCOPY WITH BIOPSY;  Surgeon: Malka So, MD;  Location: [facility];  Service: Urology;  Laterality: N/A;  WITH FULGURATION  . Transurethral resection of prostate  03/14/2012    Procedure: TRANSURETHRAL RESECTION OF THE PROSTATE WITH GYRUS INSTRUMENTS;  Surgeon: Malka So, MD;  Location: [facility];  Service: Urology;  Laterality: N/A;  . Tonsillectomy    . Cataract extraction w/ intraocular lens  implant, bilateral Bilateral   . Cardiac catheterization  05-11-2004  DR Cataio PLAQUE/ NORMAL LVF/ EF 55%/  NON-OBSTRUCTIVE LAD 25%  . Cardiac catheterization  06/2012  . Eye surgery    . Amputation Left 10/16/2014    Procedure: AMPUTATION ABOVE KNEE- LEFT;   Surgeon: Mal Misty, MD;  Location: [facility];  Service: Vascular;  Laterality: Left;    There were no vitals filed for this visit.  Visit Diagnosis:  Functional weakness  Abnormality of gait  Decreased functional activity tolerance  Unsteadiness  Balance problems  Encounter for prosthetic gait training  Status post above knee amputation of left lower extremity (HCC)      Subjective Assessment - 02/06/15 1400    Subjective Patient reports lack of confindence to ambulate alone in his home with prosthesis and walker.   Currently in Pain? No/denies            [facility] PT Assessment - 02/06/15 1400    Assessment   Referring Provider Redge Gainer, MD                     [facility] Adult PT Treatment/Exercise - 02/06/15 1400    Transfers   Sit to Stand 5: Supervision;With upper extremity assist;With armrests;From chair/3-in-1;Other (comment)  to RW for stabilization   Sit to Stand Details Verbal cues for technique   Stand to Sit 5: Supervision;With upper extremity assist;With armrests;To chair/3-in-1;Other (comment)  from RW   Stand to Sit Details (indicate cue type and reason) Verbal cues for technique   Stand Pivot Transfers 5: Supervision  with RW & prosthesis   Stand Pivot Transfer Details (indicate cue type and reason) verbal cues on knee control and need for upright posture with prosthetic knee extension to enable use of prosthesis, which currently requires use of RW for him   Ambulation/Gait   Ambulation/Gait Yes   Ambulation/Gait Assistance 5: Supervision   Ambulation/Gait Assistance Details cues on posture and negotiating barrier   Ambulation Distance (Feet) 75 Feet  75' X 2   Assistive device Rolling walker;Prosthesis   Gait Pattern Step-to pattern;Decreased step length - right;Decreased stance time - left;Decreased stride length;Decreased hip/knee flexion - left;Decreased weight shift to left;Left hip hike;Left circumduction;Trunk flexed;Abducted -  left;Poor foot clearance - left   Ambulation Surface Indoor;Level   High Level Balance   High Level Balance Activities Negotitating around obstacles  RW & prosthesis   High Level Balance Comments verbal cues on technique to control prosthesis   Prosthetics   Current prosthetic wear tolerance (days/week)  daily   Current prosthetic wear tolerance (#hours/day)  wearing >90% of awake hours   Residual limb condition  intact   Education Provided Residual limb care;Correct ply sock adjustment   Person(s) Educated Patient   Education Method Explanation;Verbal cues   Education Method Verbalized understanding;Verbal cues required;Needs further instruction   Donning Prosthesis Modified independent (device/increased time)   Doffing Prosthesis Modified independent (  device/increased time)                PT Education - 02/06/15 1400    Education provided Yes   Education Details increasing activity level in home, using RW & prosthesis in home, decrease use of w/c & sit on household furniture to encourage increased frequency of gait.   Person(s) Educated Patient   Methods Explanation;Verbal cues   Comprehension Verbalized understanding;Verbal cues required          PT Short Term Goals - 01/13/15 1445    PT SHORT TERM GOAL #1   Title Patient donnes prosthesis correctly modified independent. (Target Date: 01/15/2015)   Time 1   Period Months   Status On-going   PT SHORT TERM GOAL #2   Title Patient tolerates wear >10hrs total per day without skin issues or limb pain.  (Target Date: 01/15/2015)   Baseline MET 01/13/2015 Patient reports wear 5hrs 2x/day with  skin issues or limb pain.    Time 1   Period Months   Status Achieved   PT SHORT TERM GOAL #3   Title Patient performs standing balance with RW & prosthesis reaching 10", to floor and manages pants for toileting modified inependent.  (Target Date: 01/15/2015)   Baseline Partially MET 01/13/2015 Patient performs standing balance with  RW & prosthesis reaching 10", to floor and simulates managing pants with supervision.    Time 1   Period Months   Status Partially Met   PT SHORT TERM GOAL #4   Title Patient ambulates 200' with RW & prosthesis with SBA.  (Target Date: 01/15/2015)   Baseline NOT MET 01/13/2015 Patient ambulates 125' with RW & prosthesis with minimal assist.    Time 1   Period Months   Status Not Met   PT SHORT TERM GOAL #5   Title Patient negotiates stairs (2 rails), ramps & curbs with RW & prosthesis with Minimal assist.  (Target Date: 01/15/2015)   Time 1   Period Months   Status On-going           PT Long Term Goals - 01/15/15 1150    PT LONG TERM GOAL #1   Title Patient verbalizes proper prosthetic care including donning.  (Target Date: 02/14/2015)   Time 2   Period Months   Status On-going   PT LONG TERM GOAL #2   Title Patient tolerates wear of prosthesis >80% of awake hours without skin issues or limb pain.  (Target Date 02/14/2015)   Time 2   Period Months   Status On-going   PT LONG TERM GOAL #3   Title Berg Balance >25/56  (Target Date: 02/14/2015)   Time 2   Period Months   Status On-going   PT LONG TERM GOAL #4   Title Patient ambulates 200' with rolling walker & prosthesis with supervision to enable community mobility.  (Target Date 02/14/2015)   Time 2   Period Months   Status On-going   PT LONG TERM GOAL #5   Title Patient negotiates stairs, ramps & curbs with rolling walker & prosthesis with supervision to enable community mobility.  (Target Date: 02/14/2015)   Time 2   Period Months   Status On-going   PT LONG TERM GOAL #6   Title Patient ambulates household around furniture with rolling walker & prosthesis modified independent.  (Target Date: 11/4//2016)   Time 2   Period Months   Status On-going  Plan - 02/06/15 1445    Clinical Impression Statement Patient appears safe to use RW in home with prosthesis but not rollator walker. Patient seems to  understand need for rolling walker use for stand-pivot transfers with his prosthesis to enable knee control.   Pt will benefit from skilled therapeutic intervention in order to improve on the following deficits Abnormal gait;Decreased activity tolerance;Decreased balance;Decreased endurance;Decreased knowledge of use of DME;Decreased mobility;Decreased range of motion;Decreased skin integrity;Decreased strength;Postural dysfunction;Prosthetic Dependency   Rehab Potential Good   PT Frequency 2x / week   PT Duration Other (comment)  9 weeks (60 days)   PT Treatment/Interventions ADLs/Self Care Home Management;DME Instruction;Gait training;Stair training;Functional mobility training;Therapeutic activities;Therapeutic exercise;Balance training;Neuromuscular re-education;Patient/family education;Prosthetic Training;Manual techniques   PT Next Visit Plan Assess LTGs.   Consulted and Agree with Plan of Care Patient        Problem List Patient Active Problem List   Diagnosis Date Noted  . Status post above knee amputation of left lower extremity ([place]) 10/18/2014  . Tachyarrhythmia 10/05/2014  . Aspiration pneumonia ([place]) 10/05/2014  . UTI (lower urinary tract infection) 10/03/2014  . Sepsis due to other etiology ([place]) 10/03/2014  . Gangrene of toe ([place]) 10/03/2014  . Ischemic ulcer of left foot ([place]) 10/03/2014  . Atherosclerotic PVD with ulceration ([place]) 10/03/2014  . Warfarin-induced coagulopathy ([place]) 10/03/2014  . Atrial fibrillation with RVR ([place]) 10/03/2014  . Acute kidney injury ([place]) 10/03/2014  . Hyperkalemia 10/03/2014  . Cellulitis of left leg 10/03/2014  . PAD (peripheral artery disease) ([place]) 09/24/2014  . NSTEMI (non-ST elevated myocardial infarction) ([place]) 10/09/2013  . High risk medication use 05/17/2013  . BPH (benign prostatic hyperplasia) 05/17/2013  . Vitamin D deficiency 05/17/2013  . History of pulmonary embolism (2014) 04/17/2013  . NSVT (nonsustained ventricular  tachycardia)- not an ICD candidate 04/17/2013  . NICM (nonischemic cardiomyopathy)- EF 20-25% by Echo 04/12/13 04/17/2013  . Chronic diastolic heart failure, NYHA class 1 ([place]) 03/15/2013  . DVT of lower extremity, bilateral ([place]) 03/15/2013  . Fatigue 11/02/2012  . Constipation 11/02/2012  . Alcohol abuse 08/27/2012  . Syncope 06/21/2012  . Bladder cancer ([place]) 03/15/2012  . Benign localized hyperplasia of prostate with urinary obstruction 03/15/2012  . Benign essential HTN   . Hyperlipidemia   . PVCs (premature ventricular contractions)   . CAD- non obstructive disease by cath 3/14     , PT, DPT 02/06/2015, 11:43 PM  [facility] [address] [place], [place], [address] Phone: [phone]   Fax:  [phone]  Name: Scott [name] MRN: [id number] Date of Birth: 29-Dec-1921

## 2015-02-07 ENCOUNTER — Ambulatory Visit: Payer: Self-pay | Admitting: Pharmacist

## 2015-02-07 DIAGNOSIS — Z86711 Personal history of pulmonary embolism: Secondary | ICD-10-CM

## 2015-02-07 DIAGNOSIS — I82403 Acute embolism and thrombosis of unspecified deep veins of lower extremity, bilateral: Secondary | ICD-10-CM

## 2015-02-07 LAB — POCT INR: INR: 2.8

## 2015-02-07 NOTE — Progress Notes (Signed)
No charge for labs or visit - INR check through home monitoring system  

## 2015-02-11 ENCOUNTER — Ambulatory Visit (HOSPITAL_COMMUNITY)
Admission: RE | Admit: 2015-02-11 | Discharge: 2015-02-11 | Disposition: A | Discharge status: Home / Self Care | Payer: Medicare Other | Source: Ambulatory Visit | Attending: Vascular Surgery | Admitting: Vascular Surgery

## 2015-02-11 ENCOUNTER — Ambulatory Visit: Payer: Medicare Other | Attending: Family Medicine | Admitting: Physical Therapy

## 2015-02-11 ENCOUNTER — Encounter: Payer: Medicare Other | Admitting: Physical Therapy

## 2015-02-11 ENCOUNTER — Encounter: Payer: Self-pay | Admitting: Physical Therapy

## 2015-02-11 ENCOUNTER — Encounter: Payer: Self-pay | Admitting: Vascular Surgery

## 2015-02-11 ENCOUNTER — Ambulatory Visit (INDEPENDENT_AMBULATORY_CARE_PROVIDER_SITE_OTHER): Payer: Medicare Other | Admitting: Vascular Surgery

## 2015-02-11 VITALS — BP 93/57 | HR 58 | Temp 97.1°F | Resp 16 | Ht 67.0 in | Wt 165.0 lb

## 2015-02-11 DIAGNOSIS — R531 Weakness: Secondary | ICD-10-CM | POA: Diagnosis not present

## 2015-02-11 DIAGNOSIS — Z5189 Encounter for other specified aftercare: Secondary | ICD-10-CM | POA: Insufficient documentation

## 2015-02-11 DIAGNOSIS — I739 Peripheral vascular disease, unspecified: Secondary | ICD-10-CM | POA: Diagnosis not present

## 2015-02-11 DIAGNOSIS — R938 Abnormal findings on diagnostic imaging of other specified body structures: Secondary | ICD-10-CM | POA: Insufficient documentation

## 2015-02-11 DIAGNOSIS — I70269 Atherosclerosis of native arteries of extremities with gangrene, unspecified extremity: Secondary | ICD-10-CM

## 2015-02-11 DIAGNOSIS — R269 Unspecified abnormalities of gait and mobility: Secondary | ICD-10-CM | POA: Insufficient documentation

## 2015-02-11 DIAGNOSIS — R6889 Other general symptoms and signs: Secondary | ICD-10-CM | POA: Insufficient documentation

## 2015-02-11 DIAGNOSIS — I10 Essential (primary) hypertension: Secondary | ICD-10-CM | POA: Insufficient documentation

## 2015-02-11 DIAGNOSIS — I70213 Atherosclerosis of native arteries of extremities with intermittent claudication, bilateral legs: Secondary | ICD-10-CM | POA: Diagnosis not present

## 2015-02-11 DIAGNOSIS — R2681 Unsteadiness on feet: Secondary | ICD-10-CM | POA: Diagnosis not present

## 2015-02-11 DIAGNOSIS — E785 Hyperlipidemia, unspecified: Secondary | ICD-10-CM | POA: Insufficient documentation

## 2015-02-11 DIAGNOSIS — R29818 Other symptoms and signs involving the nervous system: Secondary | ICD-10-CM | POA: Diagnosis not present

## 2015-02-11 DIAGNOSIS — Z89612 Acquired absence of left leg above knee: Secondary | ICD-10-CM | POA: Diagnosis not present

## 2015-02-11 DIAGNOSIS — R2689 Other abnormalities of gait and mobility: Secondary | ICD-10-CM

## 2015-02-11 DIAGNOSIS — Z4789 Encounter for other orthopedic aftercare: Secondary | ICD-10-CM

## 2015-02-11 NOTE — Progress Notes (Signed)
Subjective:     Patient ID: Scott [name], male   DOB: 1921-05-10, 81 y.o.   MRN: [id number]  HPI this 81 year old male was referred urgently by his son from [facility] [facility] for evaluation of right leg. Patient underwent left above-knee amputation by me in July 2016 because of severely ischemic left foot patient has known superficial femoral and tibial occlusive disease. He denies pain in the foot on the right. 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 He's had no history of gangrene pressure sores cellulitis or other limb threatening problems on the right side. He continues to have some mild phantom pain on the left.  Past Medical History  Diagnosis Date  . HTN (hypertension)   . Hyperlipidemia   . PVCs (premature ventricular contractions)   . Glaucoma   . PVD (peripheral vascular disease) (HCC)     Right ABI .75, Left .78 (2006)  . Popliteal artery aneurysm, bilateral (HCC) DECUMENTED CHRONIC PARTIAL OCCLUSION--  PT DENIES CLAUDICATION OR ANY OTHER SYMPTOMS  . Frequency of urination   . Nocturia   . DVT, bilateral lower limbs ([place]) 03/2013    a. Bilat DVT dx 03/2013.  . Pulmonary embolism ([place])     a. By CT angio 04/2013.  Marland Kitchen Elevated troponin     a. Adm 12/30-04/2013 - not felt to represent ACS; ?due to PE. b. Patent cors 06/2012.  Marland Kitchen NSVT (nonsustained ventricular tachycardia) ([place])     a. NSVT 06/2012; NSVT also seen during 03/2013 adm. b. Med rx. Not candidate for ICD given adv age.  Marland Kitchen ETOH abuse   . Syncope     a. Felt to be postural syncope related to diuretics 06/2012.  Marland Kitchen Heart murmur   . DVT (deep venous thrombosis) ([place])     "I think in both legs"  . Pneumonia     "couple times"  . DDD (degenerative disc disease)   . Arthritis     "all over"  . Depression   . CAD- non obstructive disease by cath 3/14   . PAD (peripheral artery disease) ([place])   . Gangrene of toe ([place]) 10/03/2014  . Sepsis due to other etiology ([place]) 10/03/2014  . Chronic  systolic heart failure ([place]) 10/04/14    EF improved 50-55%  . Chronic systolic CHF (congestive heart failure) ([place])     a. NICM - patent cors 06/2012, EF 40% at that time. b. 03/2013 eval: EF 20-25%.  . Chronic lower back pain   . Bladder cancer ([place]) 03/15/2012    pt denies this hx on 10/15/2014    Social History  Substance Use Topics  . Smoking status: Former Smoker -- 1.00 packs/day for 20 years    Types: Cigarettes    Start date: 04/12/1942    Quit date: 04/13/1975  . Smokeless tobacco: Never Used  . Alcohol Use: No     Comment: 10/09/2013 "quit drinking in early June 2015"    Family History  Problem Relation Age of Onset  . Hypertension Mother   . Arthritis Mother   . Lung cancer Sister   . Deep vein thrombosis Father   . Early death Brother     Allergies  Allergen Reactions  . Lipitor [Atorvastatin] Other (See Comments)    unknown     Current outpatient prescriptions:  .  alum & mag hydroxide-simeth (MAALOX/MYLANTA) 235-361-44 MG/5ML suspension, Take 30 mLs by mouth every 6 (six) hours as needed for indigestion or heartburn (dyspepsia)., Disp: 355 mL,  Rfl: 0 .  bisacodyl (DULCOLAX) 10 MG suppository, Place 1 suppository (10 mg total) rectally daily as needed for moderate constipation., Disp: 12 suppository, Rfl: 0 .  donepezil (ARICEPT) 10 MG tablet, TAKE ONE TABLET AT BEDTIME, Disp: 30 tablet, Rfl: 2 .  ferrous sulfate (FERROUSUL) 325 (65 FE) MG tablet, Take 1 tablet (325 mg total) by mouth daily with breakfast., Disp: 30 tablet, Rfl: 5 .  fluticasone (FLONASE) 50 MCG/ACT nasal spray, Place 2 sprays into both nostrils daily., Disp: 16 g, Rfl: 6 .  folic acid (FOLVITE) 1 MG tablet, Take 1 tablet (1 mg total) by mouth daily., Disp: 30 tablet, Rfl: 5 .  furosemide (LASIX) 40 MG tablet, , Disp: , Rfl:  .  gabapentin (NEURONTIN) 100 MG capsule, TAKE (2) CAPSULES TWICE DAILY., Disp: 120 capsule, Rfl: 2 .  hydrocerin (EUCERIN) CREA, Apply 1 application topically 2 (two) times  daily., Disp: , Rfl: 0 .  isosorbide mononitrate (IMDUR) 30 MG 24 hr tablet, Take 1 tablet (30 mg total) by mouth daily., Disp: 30 tablet, Rfl: 0 .  metoprolol succinate (TOPROL-XL) 50 MG 24 hr tablet, Take 1 tablet (50 mg total) by mouth daily. Take with or immediately following a meal., Disp: 30 tablet, Rfl: 2 .  nitroGLYCERIN (NITROSTAT) 0.4 MG SL tablet, Place 1 tablet (0.4 mg total) under the tongue every 5 (five) minutes as needed for chest pain (up to 3 doses)., Disp: 25 tablet, Rfl: 3 .  oxyCODONE (OXY IR/ROXICODONE) 5 MG immediate release tablet, Take 0.5 tablets (2.5 mg total) by mouth every 6 (six) hours as needed for moderate pain., Disp: 60 tablet, Rfl: 0 .  pantoprazole (PROTONIX) 40 MG tablet, Take 1 tablet (40 mg total) by mouth daily., Disp: 30 tablet, Rfl: 5 .  simvastatin (ZOCOR) 10 MG tablet, TAKE 1 TABLET DAILY, Disp: 30 tablet, Rfl: 4 .  tamsulosin (FLOMAX) 0.4 MG CAPS capsule, TAKE (1) CAPSULE DAILY, Disp: 30 capsule, Rfl: 5 .  thiamine 100 MG tablet, Take 1 tablet (100 mg total) by mouth daily., Disp: , Rfl:  .  traZODone (DESYREL) 50 MG tablet, Take 0.5-1 tablets (25-50 mg total) by mouth at bedtime as needed for sleep., Disp: , Rfl:  .  ULORIC 40 MG tablet, TAKE 1 TABLET DAILY, Disp: 30 tablet, Rfl: 4 .  warfarin (COUMADIN) 2 MG tablet, TAKE 1 TABLET ALONG WITH 2.5MG  TO EQUAL 4.5 MG DAILY, Disp: 30 tablet, Rfl: 2 .  warfarin (COUMADIN) 5 MG tablet, Take 1 tablet (5 mg total) by mouth daily at 6 PM. (Patient taking differently: Take 4.5 mg by mouth daily at 6 PM. ), Disp: , Rfl:  .  feeding supplement, ENSURE ENLIVE, (ENSURE ENLIVE) LIQD, Take 237 mLs by mouth 2 (two) times daily between meals. (Patient not taking: Reported on 02/11/2015), Disp: 237 mL, Rfl: 12  Filed Vitals:   02/11/15 1038  BP: 93/57  Pulse: 58  Temp: 97.1 F (36.2 C)  Resp: 16  Height: 5\' 7"  (1.702 m)  Weight: 165 lb (74.844 kg)  SpO2: 96%    Body mass index is 25.84  kg/(m^2).           Review of Systems denies chest pain, dyspnea on exertion, PND, orthopnea, hemoptysis    Objective:   Physical Exam BP 93/57 mmHg  Pulse 58  Temp(Src) 97.1 F (36.2 C)  Resp 16  Ht 5\' 7"  (1.702 m)  Wt 165 lb (74.844 kg)  BMI 25.84 kg/m2  SpO2 96%  Gen. elderly male in  no apparent distress alert and oriented 3 Lungs no rhonchi or wheezing Cardiovascular regular rhythm no murmurs Left below-knee dictation stump is well healed with no evidence of infection. No point tenderness. Right leg with 3+ femoral pulses no popliteal or distal pulses palpable. No evidence of pressure sores, infection, gangrene, or ulceration in the right foot. Toes right foot are pink and well perfused. 1+ edema in mid calf distally on the right.  Today I ordered ABIs which I reviewed and interpreted. There is monophasic flow in the right foot with evidence of calcified vessels making the ABI unreliable. It was 0.65 in the anterior tibial.  Also performed a bedside Doppler exam which revealed easily audible flow in the anterior tibial, posterior tibial, and peroneal arteries.     Assessment:     Known superficial femoral and tibial occlusive disease with no limb threatening ischemia of right foot-circulation is stable    Plan:     Avoidance of pressure sores or any trauma to right foot is very important and I discussed this with his nursing assistant. He will return to see [place] on when necessary basis

## 2015-02-12 ENCOUNTER — Encounter: Payer: Medicare Other | Admitting: Physical Therapy

## 2015-02-12 NOTE — Therapy (Signed)
[facility] [address] [place], [place], [address] Phone: [phone]   Fax:  [phone]  Physical Therapy Treatment  Patient Details  Name: Scott [name] MRN: [id number] Date of Birth: [birth year] Referring Provider: Redge Gainer, MD  Encounter Date: 02/11/2015      PT End of Session - 02/11/15 1445    Visit Number 13   Number of Visits 18   Date for PT Re-Evaluation 02/14/15   Authorization Type Medicare Do G-code every 10th visit   PT Start Time 1400   PT Stop Time 1445   PT Time Calculation (min) 45 min   Equipment Utilized During Treatment Gait belt   Activity Tolerance Patient tolerated treatment well   Behavior During Therapy [facility] for tasks assessed/performed      Past Medical History  Diagnosis Date  . HTN (hypertension)   . Hyperlipidemia   . PVCs (premature ventricular contractions)   . Glaucoma   . PVD (peripheral vascular disease) (HCC)     Right ABI .75, Left .78 (2006)  . Popliteal artery aneurysm, bilateral (HCC) DECUMENTED CHRONIC PARTIAL OCCLUSION--  PT DENIES CLAUDICATION OR ANY OTHER SYMPTOMS  . Frequency of urination   . Nocturia   . DVT, bilateral lower limbs ([place]) 03/2013    a. Bilat DVT dx 03/2013.  . Pulmonary embolism ([place])     a. By CT angio 04/2013.  Marland Kitchen Elevated troponin     a. Adm 12/30-04/2013 - not felt to represent ACS; ?due to PE. b. Patent cors 06/2012.  Marland Kitchen NSVT (nonsustained ventricular tachycardia) ([place])     a. NSVT 06/2012; NSVT also seen during 03/2013 adm. b. Med rx. Not candidate for ICD given adv age.  Marland Kitchen ETOH abuse   . Syncope     a. Felt to be postural syncope related to diuretics 06/2012.  Marland Kitchen Heart murmur   . DVT (deep venous thrombosis) ([place])     "I think in both legs"  . Pneumonia     "couple times"  . DDD (degenerative disc disease)   . Arthritis     "all over"  . Depression   . CAD- non obstructive disease by cath 3/14   . PAD (peripheral artery disease)  ([place])   . Gangrene of toe ([place]) 10/03/2014  . Sepsis due to other etiology ([place]) 10/03/2014  . Chronic systolic heart failure ([place]) 10/04/14    EF improved 50-55%  . Chronic systolic CHF (congestive heart failure) ([place])     a. NICM - patent cors 06/2012, EF 40% at that time. b. 03/2013 eval: EF 20-25%.  . Chronic lower back pain   . Bladder cancer ([place]) 03/15/2012    pt denies this hx on 10/15/2014    Past Surgical History  Procedure Laterality Date  . Transthoracic echocardiogram  09-22-2010    MODERATE LVH/ EF 17%/ GRADE I DIASTOLIC DYSFUNCTION/ AORTIC SCLEROSIS WITHOUT STENOSIS/  RV  SYSTOLIC  MILDLY REDUCED FUNCTION  . Cardiovascular stress test  10-15-2010    LOW RISK NUCLEAR STUDY/ NO EVIDENCE OF ISCHEMIA/ NORMAL EF  . Shoulder surgery  1970's    "don't remember which side or what kind of OR"  . Transurethral resection of bladder tumor  10/07/2011    Procedure: TRANSURETHRAL RESECTION OF BLADDER TUMOR (TURBT);  Surgeon: Malka So, MD;  Location: [facility];  Service: Urology;  Laterality: N/A;  . Cystoscopy  10/07/2011    Procedure: CYSTOSCOPY;  Surgeon: Malka So, MD;  Location: [facility];  Service: Urology;  Laterality: N/A;  . Cystoscopy with biopsy  03/14/2012    Procedure: CYSTOSCOPY WITH BIOPSY;  Surgeon: John J Wrenn, MD;  Location: [facility];  Service: Urology;  Laterality: N/A;  WITH FULGURATION  . Transurethral resection of prostate  03/14/2012    Procedure: TRANSURETHRAL RESECTION OF THE PROSTATE WITH GYRUS INSTRUMENTS;  Surgeon: John J Wrenn, MD;  Location: [facility];  Service: Urology;  Laterality: N/A;  . Tonsillectomy    . Cataract extraction w/ intraocular lens  implant, bilateral Bilateral   . Cardiac catheterization  05-11-2004  DR HOCHREIN    MINIMAL CORANARY PLAQUE/ NORMAL LVF/ EF 55%/  NON-OBSTRUCTIVE LAD 25%  . Cardiac catheterization  06/2012  . Eye surgery    . Amputation Left 10/16/2014    Procedure: AMPUTATION ABOVE KNEE- LEFT;   Surgeon: Kinney D Lawson, MD;  Location: [facility];  Service: Vascular;  Laterality: Left;    There were no vitals filed for this visit.  Visit Diagnosis:  Functional weakness  Abnormality of gait  Decreased functional activity tolerance  Unsteadiness  Balance problems  Encounter for prosthetic gait training  Status post above knee amputation of left lower extremity (HCC)      Subjective Assessment - 02/11/15 1400    Subjective Patient reports wearing prosthesis most of awake hours. He removes it to take a nap most afternoons. He saw vascular doctor prior to PT today and no issues with either LE. He arrived with prosthesis off due to MD appt.     Patient is accompained by: --  CNA, Angela   Currently in Pain? No/denies                         [facility] Adult PT Treatment/Exercise - 02/11/15 1445    Transfers   Sit to Stand 5: Supervision;With upper extremity assist;From chair/3-in-1;Other (comment)  to RW for stabilization, chair without armrests using UEs   Sit to Stand Details Verbal cues for technique   Sit to Stand Details (indicate cue type and reason) verbal & demo technique with chairs without armrests.   Stand to Sit 5: Supervision;With upper extremity assist;To chair/3-in-1;Other (comment)  from RW, chairs without armrest   Stand to Sit Details (indicate cue type and reason) Verbal cues for technique   Stand to Sit Details verbal cues on technique with chairs without armrests   Stand Pivot Transfers 5: Supervision  with RW & prosthesis   Stand Pivot Transfer Details (indicate cue type and reason) verbal cues on need for RW for stand-pivot transfers like car   Ambulation/Gait   Ambulation/Gait Yes   Ambulation/Gait Assistance 5: Supervision   Ambulation/Gait Assistance Details PT instructed his CNA how to assist / supervise and technique including barriers.   Ambulation Distance (Feet) 80 Feet  80' X 2   Assistive device Rolling walker;Prosthesis   Gait  Pattern Step-to pattern;Decreased step length - right;Decreased stance time - left;Decreased stride length;Decreased hip/knee flexion - left;Decreased weight shift to left;Left hip hike;Left circumduction;Trunk flexed;Abducted - left;Poor foot clearance - left   Ambulation Surface Indoor;Level   Stairs Yes   Stairs Assistance 4: Min assist   Stairs Assistance Details (indicate cue type and reason) demo & verbal cues how to use single rail with 2 hands.    Stair Management Technique One rail Left;Step to pattern;Sideways  2 hands on left rail.    Number of Stairs 4   Ramp 4: Min assist  RW &   prosthesis   Ramp Details (indicate cue type and reason) verbal cues on technique including knee control; PT instructed CNA in proper technique & assisting   Curb 4: Min assist  RW & prosthesis    Curb Details (indicate cue type and reason) verbal cues on sequence & technique; PT instructed CNA in technique & safe assistance   High Level Balance   High Level Balance Activities --   High Level Balance Comments --   Prosthetics   Prosthetic Care Comments  Patient arrived with prosthesis off due to MD appt per pt report; PT instructed in need to wear to enable prosthesis to aid mobility; PT instructed in standing to donne to engage pin with increased edema due to not wearing prosthesis.   Current prosthetic wear tolerance (days/week)  daily   Current prosthetic wear tolerance (#hours/day)  wearing >90% of awake hours   Residual limb condition  intact   Education Provided Residual limb care;Correct ply sock adjustment;Proper Donning   Person(s) Educated Patient;Caregiver(s)   Education Method Explanation;Demonstration;Tactile cues;Verbal cues   Education Method Verbalized understanding;Returned demonstration;Tactile cues required;Verbal cues required;Needs further instruction   Donning Prosthesis Supervision   Doffing Prosthesis Modified independent (device/increased time)                  PT  Short Term Goals - 01/13/15 1445    PT SHORT TERM GOAL #1   Title Patient donnes prosthesis correctly modified independent. (Target Date: 01/15/2015)   Time 1   Period Months   Status On-going   PT SHORT TERM GOAL #2   Title Patient tolerates wear >10hrs total per day without skin issues or limb pain.  (Target Date: 01/15/2015)   Baseline MET 01/13/2015 Patient reports wear 5hrs 2x/day with  skin issues or limb pain.    Time 1   Period Months   Status Achieved   PT SHORT TERM GOAL #3   Title Patient performs standing balance with RW & prosthesis reaching 10", to floor and manages pants for toileting modified inependent.  (Target Date: 01/15/2015)   Baseline Partially MET 01/13/2015 Patient performs standing balance with RW & prosthesis reaching 10", to floor and simulates managing pants with supervision.    Time 1   Period Months   Status Partially Met   PT SHORT TERM GOAL #4   Title Patient ambulates 200' with RW & prosthesis with SBA.  (Target Date: 01/15/2015)   Baseline NOT MET 01/13/2015 Patient ambulates 125' with RW & prosthesis with minimal assist.    Time 1   Period Months   Status Not Met   PT SHORT TERM GOAL #5   Title Patient negotiates stairs (2 rails), ramps & curbs with RW & prosthesis with Minimal assist.  (Target Date: 01/15/2015)   Time 1   Period Months   Status On-going           PT Long Term Goals - 02/11/15 1445    PT LONG TERM GOAL #1   Title Patient verbalizes proper prosthetic care including donning.  (Target Date: 02/14/2015) NEW Target Date 04/11/2015   Baseline Partially MET 02/11/2015  Patient donnes prosthesis modified independent. He requires cues for adjusting ply socks and addressing issues to call prosthetist.    Time 2   Period Months   Status On-going   PT LONG TERM GOAL #2   Title Patient tolerates wear of prosthesis >80% of awake hours without skin issues or limb pain.  (Target Date 02/14/2015)   Baseline MET 02/11/2015     Time 2   Period Months    Status Achieved   PT LONG TERM GOAL #3   Title Berg Balance >25/56  (Target Date: 02/14/2015)   Time 2   Period Months   Status On-going   PT LONG TERM GOAL #4   Title Patient ambulates 200' with rolling walker & prosthesis with supervision to enable community mobility.  (Target Date 02/14/2015)   Time 2   Period Months   Status On-going   PT LONG TERM GOAL #5   Title Patient negotiates stairs, ramps & curbs with rolling walker & prosthesis with supervision to enable community mobility.  (Target Date: 02/14/2015)   Time 2   Period Months   Status On-going   PT LONG TERM GOAL #6   Title Patient ambulates household around furniture with rolling walker & prosthesis modified independent.  (Target Date: 11/4//2016)   Time 2   Period Months   Status On-going               Plan - 02/11/15 1445    Clinical Impression Statement Patient appears will need additional PT to achieve safe prosthetic gait at his functional potential. He still appears to have potential to ambulate in home with RW modified independent and basic community with supervision with RW.    Pt will benefit from skilled therapeutic intervention in order to improve on the following deficits Abnormal gait;Decreased activity tolerance;Decreased balance;Decreased endurance;Decreased knowledge of use of DME;Decreased mobility;Decreased range of motion;Decreased skin integrity;Decreased strength;Postural dysfunction;Prosthetic Dependency   Rehab Potential Good   PT Frequency 2x / week   PT Duration Other (comment)  9 weeks (60 days)   PT Treatment/Interventions ADLs/Self Care Home Management;DME Instruction;Gait training;Stair training;Functional mobility training;Therapeutic activities;Therapeutic exercise;Balance training;Neuromuscular re-education;Patient/family education;Prosthetic Training;Manual techniques   PT Next Visit Plan Assess LTGs and recertify   Consulted and Agree with Plan of Care Patient        Problem  List Patient Active Problem List   Diagnosis Date Noted  . Status post above knee amputation of left lower extremity ([place]) 10/18/2014  . Tachyarrhythmia 10/05/2014  . Aspiration pneumonia ([place]) 10/05/2014  . UTI (lower urinary tract infection) 10/03/2014  . Sepsis due to other etiology ([place]) 10/03/2014  . Gangrene of toe ([place]) 10/03/2014  . Ischemic ulcer of left foot ([place]) 10/03/2014  . Atherosclerotic PVD with ulceration ([place]) 10/03/2014  . Warfarin-induced coagulopathy ([place]) 10/03/2014  . Atrial fibrillation with RVR ([place]) 10/03/2014  . Acute kidney injury ([place]) 10/03/2014  . Hyperkalemia 10/03/2014  . Cellulitis of left leg 10/03/2014  . PAD (peripheral artery disease) ([place]) 09/24/2014  . NSTEMI (non-ST elevated myocardial infarction) ([place]) 10/09/2013  . High risk medication use 05/17/2013  . BPH (benign prostatic hyperplasia) 05/17/2013  . Vitamin D deficiency 05/17/2013  . History of pulmonary embolism (2014) 04/17/2013  . NSVT (nonsustained ventricular tachycardia)- not an ICD candidate 04/17/2013  . NICM (nonischemic cardiomyopathy)- EF 20-25% by Echo 04/12/13 04/17/2013  . Chronic diastolic heart failure, NYHA class 1 ([place]) 03/15/2013  . DVT of lower extremity, bilateral ([place]) 03/15/2013  . Fatigue 11/02/2012  . Constipation 11/02/2012  . Alcohol abuse 08/27/2012  . Syncope 06/21/2012  . Bladder cancer ([place]) 03/15/2012  . Benign localized hyperplasia of prostate with urinary obstruction 03/15/2012  . Benign essential HTN   . Hyperlipidemia   . PVCs (premature ventricular contractions)   . CAD- non obstructive disease by cath 3/14     Triana Coover PT, DPT 02/12/2015, 6:21 PM  [facility] [address] [place], [place], [address] Phone: [phone]   Fax:  [phone]  Name: Scott [name] MRN: [id number] Date of Birth: [birth year]

## 2015-02-13 ENCOUNTER — Other Ambulatory Visit: Payer: Self-pay | Admitting: Family Medicine

## 2015-02-13 ENCOUNTER — Ambulatory Visit: Payer: Medicare Other | Admitting: Physical Therapy

## 2015-02-14 ENCOUNTER — Ambulatory Visit: Payer: Medicare Other | Admitting: Rehabilitation

## 2015-02-14 ENCOUNTER — Encounter: Payer: Self-pay | Admitting: Rehabilitation

## 2015-02-14 DIAGNOSIS — R269 Unspecified abnormalities of gait and mobility: Secondary | ICD-10-CM | POA: Diagnosis not present

## 2015-02-14 DIAGNOSIS — R2681 Unsteadiness on feet: Secondary | ICD-10-CM | POA: Diagnosis not present

## 2015-02-14 DIAGNOSIS — R6889 Other general symptoms and signs: Secondary | ICD-10-CM | POA: Diagnosis not present

## 2015-02-14 DIAGNOSIS — R531 Weakness: Secondary | ICD-10-CM | POA: Diagnosis not present

## 2015-02-14 DIAGNOSIS — R29818 Other symptoms and signs involving the nervous system: Secondary | ICD-10-CM | POA: Diagnosis not present

## 2015-02-14 DIAGNOSIS — Z5189 Encounter for other specified aftercare: Secondary | ICD-10-CM | POA: Diagnosis not present

## 2015-02-14 NOTE — Therapy (Signed)
[facility] [address] [place], [place], [address] Phone: [phone]   Fax:  [phone]  Physical Therapy Treatment  Patient Details  Name: Scott [name] MRN: [id number] Date of Birth: 1921/08/13 Referring Provider: Redge Gainer, MD  Encounter Date: 02/14/2015      PT End of Session - 02/14/15 1124    Visit Number 14   Number of Visits 18   Date for PT Re-Evaluation 04/15/15   Authorization Type Medicare Do G-code every 10th visit   PT Start Time 0945  pt late for appt   PT Stop Time 1015   PT Time Calculation (min) 30 min   Activity Tolerance Patient tolerated treatment well   Behavior During Therapy [facility] for tasks assessed/performed      Past Medical History  Diagnosis Date  . HTN (hypertension)   . Hyperlipidemia   . PVCs (premature ventricular contractions)   . Glaucoma   . PVD (peripheral vascular disease) (HCC)     Right ABI .75, Left .78 (2006)  . Popliteal artery aneurysm, bilateral (HCC) DECUMENTED CHRONIC PARTIAL OCCLUSION--  PT DENIES CLAUDICATION OR ANY OTHER SYMPTOMS  . Frequency of urination   . Nocturia   . DVT, bilateral lower limbs ([place]) 03/2013    a. Bilat DVT dx 03/2013.  . Pulmonary embolism ([place])     a. By CT angio 04/2013.  Marland Kitchen Elevated troponin     a. Adm 12/30-04/2013 - not felt to represent ACS; ?due to PE. b. Patent cors 06/2012.  Marland Kitchen NSVT (nonsustained ventricular tachycardia) ([place])     a. NSVT 06/2012; NSVT also seen during 03/2013 adm. b. Med rx. Not candidate for ICD given adv age.  Marland Kitchen ETOH abuse   . Syncope     a. Felt to be postural syncope related to diuretics 06/2012.  Marland Kitchen Heart murmur   . DVT (deep venous thrombosis) ([place])     "I think in both legs"  . Pneumonia     "couple times"  . DDD (degenerative disc disease)   . Arthritis     "all over"  . Depression   . CAD- non obstructive disease by cath 3/14   . PAD (peripheral artery disease) ([place])   . Gangrene of toe ([place])  10/03/2014  . Sepsis due to other etiology ([place]) 10/03/2014  . Chronic systolic heart failure ([place]) 10/04/14    EF improved 50-55%  . Chronic systolic CHF (congestive heart failure) ([place])     a. NICM - patent cors 06/2012, EF 40% at that time. b. 03/2013 eval: EF 20-25%.  . Chronic lower back pain   . Bladder cancer ([place]) 03/15/2012    pt denies this hx on 10/15/2014    Past Surgical History  Procedure Laterality Date  . Transthoracic echocardiogram  09-22-2010    MODERATE LVH/ EF 71%/ GRADE I DIASTOLIC DYSFUNCTION/ AORTIC SCLEROSIS WITHOUT STENOSIS/  RV  SYSTOLIC  MILDLY REDUCED FUNCTION  . Cardiovascular stress test  10-15-2010    LOW RISK NUCLEAR STUDY/ NO EVIDENCE OF ISCHEMIA/ NORMAL EF  . Shoulder surgery  1970's    "don't remember which side or what kind of OR"  . Transurethral resection of bladder tumor  10/07/2011    Procedure: TRANSURETHRAL RESECTION OF BLADDER TUMOR (TURBT);  Surgeon: Malka So, MD;  Location: [facility];  Service: Urology;  Laterality: N/A;  . Cystoscopy  10/07/2011    Procedure: CYSTOSCOPY;  Surgeon: Malka So, MD;  Location: [facility];  Service: Urology;  Laterality: N/A;  . Cystoscopy with biopsy  03/14/2012    Procedure: CYSTOSCOPY WITH BIOPSY;  Surgeon: Malka So, MD;  Location: [facility];  Service: Urology;  Laterality: N/A;  WITH FULGURATION  . Transurethral resection of prostate  03/14/2012    Procedure: TRANSURETHRAL RESECTION OF THE PROSTATE WITH GYRUS INSTRUMENTS;  Surgeon: Malka So, MD;  Location: [facility];  Service: Urology;  Laterality: N/A;  . Tonsillectomy    . Cataract extraction w/ intraocular lens  implant, bilateral Bilateral   . Cardiac catheterization  05-11-2004  DR Cleveland PLAQUE/ NORMAL LVF/ EF 55%/  NON-OBSTRUCTIVE LAD 25%  . Cardiac catheterization  06/2012  . Eye surgery    . Amputation Left 10/16/2014    Procedure: AMPUTATION ABOVE KNEE- LEFT;  Surgeon: Mal Misty, MD;   Location: [facility];  Service: Vascular;  Laterality: Left;    There were no vitals filed for this visit.  Visit Diagnosis:  Abnormality of gait  Functional weakness  Unsteadiness  Balance problems  Encounter for prosthetic gait training      Subjective Assessment - 02/14/15 1123    Subjective Continues to report wearing prosthesis most of awake hours.  Is agreeable to extended POC.    Patient is accompained by: --  CNA angela   Patient Stated Goals He wants to use prosthesis to walk in home & community.    Currently in Pain? No/denies            Gait:  Addressed gait to simulate house hold distances x 200' with RW at S level.  Cues for posture and adequate weight shift onto LLE.  Also address negotiation of curb, ramp and stairs during session.  Note that pt able to recall technique for ramp, curb, however still requires min A for safety and min cues for stepping through with L prosthesis once onto curb.  Performed stairs x 1 rep with L rail sideways at min A level with mod cues for foot placement and safety as well as cues for caregiver on how to assist.    NMR:  Performed BERG balance, see details below.               [facility] Adult PT Treatment/Exercise - 02/14/15 1009    Standardized Balance Assessment   Standardized Balance Assessment Berg Balance Test   Berg Balance Test   Sit to Stand Able to stand  independently using hands   Standing Unsupported Able to stand 2 minutes with supervision   Sitting with Back Unsupported but Feet Supported on Floor or Stool Able to sit safely and securely 2 minutes   Stand to Sit Uses backs of legs against chair to control descent   Transfers Able to transfer with verbal cueing and /or supervision   Standing Unsupported with Eyes Closed Able to stand 10 seconds with supervision   Standing Ubsupported with Feet Together Needs help to attain position and unable to hold for 15 seconds   From Standing, Reach Forward with Outstretched  Arm Loses balance while trying/requires external support   From Standing Position, Pick up Object from Floor Unable to try/needs assist to keep balance   From Standing Position, Turn to Look Behind Over each Shoulder Needs assist to keep from losing balance and falling   Turn 360 Degrees Needs assistance while turning   Standing Unsupported, Alternately Place Feet on Step/Stool Needs assistance to keep from falling or unable to try   Standing  Unsupported, One Foot in [employer] balance while stepping or standing   Standing on One Leg Unable to try or needs assist to prevent fall   Total Score 17                PT Education - 02/14/15 1124    Education provided Yes   Education Details Continuing to increase activity at home, safety with curb, stairs with pt and caregiver   Person(s) Educated Patient   Methods Explanation   Comprehension Verbalized understanding          PT Short Term Goals - 01/13/15 1445    PT SHORT TERM GOAL #1   Title Patient donnes prosthesis correctly modified independent. (Target Date: 01/15/2015)   Time 1   Period Months   Status On-going   PT SHORT TERM GOAL #2   Title Patient tolerates wear >10hrs total per day without skin issues or limb pain.  (Target Date: 01/15/2015)   Baseline MET 01/13/2015 Patient reports wear 5hrs 2x/day with  skin issues or limb pain.    Time 1   Period Months   Status Achieved   PT SHORT TERM GOAL #3   Title Patient performs standing balance with RW & prosthesis reaching 10", to floor and manages pants for toileting modified inependent.  (Target Date: 01/15/2015)   Baseline Partially MET 01/13/2015 Patient performs standing balance with RW & prosthesis reaching 10", to floor and simulates managing pants with supervision.    Time 1   Period Months   Status Partially Met   PT SHORT TERM GOAL #4   Title Patient ambulates 200' with RW & prosthesis with SBA.  (Target Date: 01/15/2015)   Baseline NOT MET 01/13/2015 Patient  ambulates 125' with RW & prosthesis with minimal assist.    Time 1   Period Months   Status Not Met   PT SHORT TERM GOAL #5   Title Patient negotiates stairs (2 rails), ramps & curbs with RW & prosthesis with Minimal assist.  (Target Date: 01/15/2015)   Time 1   Period Months   Status On-going           PT Long Term Goals - 02/14/15 1128    PT LONG TERM GOAL #1   Title Patient verbalizes proper prosthetic care including donning.   NEW Target Date 04/11/2015   Baseline Partially MET 02/11/2015  Patient donnes prosthesis modified independent. He requires cues for adjusting ply socks and addressing issues to call prosthetist.    Time 2   Period Months   Status Partially Met   PT LONG TERM GOAL #2   Title Patient tolerates wear of prosthesis >80% of awake hours without skin issues or limb pain.  (Target Date 02/14/2015)   Baseline MET 02/11/2015   Time 2   Period Months   Status Achieved   PT LONG TERM GOAL #3   Title Berg Balance >25/56  (Target Date: 02/14/2015)   Baseline 17/56 on 02/14/15   Time 2   Period Months   Status Not Met   PT LONG TERM GOAL #4   Title Patient ambulates 200' with rolling walker & prosthesis with supervision to enable community mobility.  (Target Date 02/14/2015)   Baseline met 02/14/15   Time 2   Period Months   Status Achieved   PT LONG TERM GOAL #5   Title Patient negotiates stairs, ramps & curbs with rolling walker & prosthesis with supervision to enable community mobility.  (Target Date: 02/14/2015)  Baseline min A on 02/14/15   Time 2   Period Months   Status Not Met   PT LONG TERM GOAL #6   Title Patient ambulates household around furniture with rolling walker & prosthesis modified independent.  (Target Date: 11/4//2016)   Baseline S level 02/14/15   Time 2   Period Months   Status Not Met               Plan - 02/14/15 1125    Clinical Impression Statement Continue to feel that pt will need additional PT to acheive safe prosthetic  gait at his functional potential.  Address remaining LTG's and pt still needing min/guard to min a for curb, ramp and stairs, but is walking at home at S level.  BERG score today was 17/56.Marland Kitchen  Pt met 2/6 goals, in addition to partially meeting one for donning prosthesis at mod I level.  Will continue to work towards these goals and have added 8 weeks to [place].    Pt will benefit from skilled therapeutic intervention in order to improve on the following deficits Abnormal gait;Decreased activity tolerance;Decreased balance;Decreased endurance;Decreased knowledge of use of DME;Decreased mobility;Decreased range of motion;Decreased skin integrity;Decreased strength;Postural dysfunction;Prosthetic Dependency   Rehab Potential Good   PT Frequency 2x / week   PT Duration Other (comment)  9 weeks (60 days)   PT Treatment/Interventions ADLs/Self Care Home Management;DME Instruction;Gait training;Stair training;Functional mobility training;Therapeutic activities;Therapeutic exercise;Balance training;Neuromuscular re-education;Patient/family education;Prosthetic Training;Manual techniques   PT Next Visit Plan Continue to address gait in household surfaces,  hip flex stretch, negotiation up/down curb, ramp and stairs.    Consulted and Agree with Plan of Care Patient   Family Member Consulted aid        Problem List Patient Active Problem List   Diagnosis Date Noted  . Status post above knee amputation of left lower extremity ([place]) 10/18/2014  . Tachyarrhythmia 10/05/2014  . Aspiration pneumonia ([place]) 10/05/2014  . UTI (lower urinary tract infection) 10/03/2014  . Sepsis due to other etiology ([place]) 10/03/2014  . Gangrene of toe ([place]) 10/03/2014  . Ischemic ulcer of left foot ([place]) 10/03/2014  . Atherosclerotic PVD with ulceration ([place]) 10/03/2014  . Warfarin-induced coagulopathy ([place]) 10/03/2014  . Atrial fibrillation with RVR ([place]) 10/03/2014  . Acute kidney injury ([place]) 10/03/2014  . Hyperkalemia  10/03/2014  . Cellulitis of left leg 10/03/2014  . PAD (peripheral artery disease) ([place]) 09/24/2014  . NSTEMI (non-ST elevated myocardial infarction) ([place]) 10/09/2013  . High risk medication use 05/17/2013  . BPH (benign prostatic hyperplasia) 05/17/2013  . Vitamin D deficiency 05/17/2013  . History of pulmonary embolism (2014) 04/17/2013  . NSVT (nonsustained ventricular tachycardia)- not an ICD candidate 04/17/2013  . NICM (nonischemic cardiomyopathy)- EF 20-25% by Echo 04/12/13 04/17/2013  . Chronic diastolic heart failure, NYHA class 1 ([place]) 03/15/2013  . DVT of lower extremity, bilateral ([place]) 03/15/2013  . Fatigue 11/02/2012  . Constipation 11/02/2012  . Alcohol abuse 08/27/2012  . Syncope 06/21/2012  . Bladder cancer ([place]) 03/15/2012  . Benign localized hyperplasia of prostate with urinary obstruction 03/15/2012  . Benign essential HTN   . Hyperlipidemia   . PVCs (premature ventricular contractions)   . CAD- non obstructive disease by cath 3/14     Cameron Sprang, PT, MPT [facility] [address] [place], [place], [address] Phone: [phone]   Fax:  [phone] 02/14/2015, 11:35 AM  Name: KENNAN DETTER MRN: [id number] Date of Birth: 03/07/1922

## 2015-02-17 ENCOUNTER — Encounter: Payer: Medicare Other | Admitting: Physical Therapy

## 2015-02-18 ENCOUNTER — Encounter: Payer: Self-pay | Admitting: Physical Therapy

## 2015-02-18 ENCOUNTER — Ambulatory Visit: Payer: Medicare Other | Admitting: Physical Therapy

## 2015-02-18 DIAGNOSIS — R6889 Other general symptoms and signs: Secondary | ICD-10-CM

## 2015-02-18 DIAGNOSIS — R29818 Other symptoms and signs involving the nervous system: Secondary | ICD-10-CM | POA: Diagnosis not present

## 2015-02-18 DIAGNOSIS — R2681 Unsteadiness on feet: Secondary | ICD-10-CM

## 2015-02-18 DIAGNOSIS — Z4789 Encounter for other orthopedic aftercare: Secondary | ICD-10-CM

## 2015-02-18 DIAGNOSIS — Z89612 Acquired absence of left leg above knee: Secondary | ICD-10-CM

## 2015-02-18 DIAGNOSIS — R269 Unspecified abnormalities of gait and mobility: Secondary | ICD-10-CM | POA: Diagnosis not present

## 2015-02-18 DIAGNOSIS — R531 Weakness: Secondary | ICD-10-CM

## 2015-02-18 DIAGNOSIS — Z5189 Encounter for other specified aftercare: Secondary | ICD-10-CM | POA: Diagnosis not present

## 2015-02-18 DIAGNOSIS — R2689 Other abnormalities of gait and mobility: Secondary | ICD-10-CM

## 2015-02-18 NOTE — Therapy (Signed)
[facility] [address] [place], [place], [address] Phone: [phone]   Fax:  [phone]  Physical Therapy Treatment  Scott [name] Details  Name: Scott [name] MRN: [id number] Date of Birth: [birth year] Referring Provider: Redge Gainer, MD  Encounter Date: 02/18/2015      Scott [name] End of Session - 02/18/15 1100    Visit Number 15   Number of Visits 30   Date for Scott [name] Re-Evaluation 04/15/15   Authorization Type Medicare Do G-code every 10th visit   Scott [name] Start Time 1017   Scott [name] Stop Time 1100   Scott [name] Time Calculation (min) 43 min   Equipment Utilized During Treatment Gait belt   Activity Tolerance Scott [name] tolerated treatment well   Behavior During Therapy [facility] for tasks assessed/performed      Past Medical History  Diagnosis Date  . HTN (hypertension)   . Hyperlipidemia   . PVCs (premature ventricular contractions)   . Glaucoma   . PVD (peripheral vascular disease) (HCC)     Right ABI .75, Left .78 (2006)  . Popliteal artery aneurysm, bilateral (HCC) DECUMENTED CHRONIC PARTIAL OCCLUSION--  Scott [name] DENIES CLAUDICATION OR ANY OTHER SYMPTOMS  . Frequency of urination   . Nocturia   . DVT, bilateral lower limbs ([place]) 03/2013    a. Bilat DVT dx 03/2013.  . Pulmonary embolism ([place])     a. By CT angio 04/2013.  Marland Kitchen Elevated troponin     a. Adm 12/30-04/2013 - not felt to represent ACS; ?due to PE. b. Patent cors 06/2012.  Marland Kitchen NSVT (nonsustained ventricular tachycardia) ([place])     a. NSVT 06/2012; NSVT also seen during 03/2013 adm. b. Med rx. Not candidate for ICD given adv age.  Marland Kitchen ETOH abuse   . Syncope     a. Felt to be postural syncope related to diuretics 06/2012.  Marland Kitchen Heart murmur   . DVT (deep venous thrombosis) ([place])     "I think in both legs"  . Pneumonia     "couple times"  . DDD (degenerative disc disease)   . Arthritis     "all over"  . Depression   . CAD- non obstructive disease by cath 3/14   . PAD (peripheral artery disease)  ([place])   . Gangrene of toe ([place]) 10/03/2014  . Sepsis due to other etiology ([place]) 10/03/2014  . Chronic systolic heart failure ([place]) 10/04/14    EF improved 50-55%  . Chronic systolic CHF (congestive heart failure) ([place])     a. NICM - patent cors 06/2012, EF 40% at that time. b. 03/2013 eval: EF 20-25%.  . Chronic lower back pain   . Bladder cancer ([place]) 03/15/2012    Scott [name] denies this hx on 10/15/2014    Past Surgical History  Procedure Laterality Date  . Transthoracic echocardiogram  09-22-2010    MODERATE LVH/ EF 29%/ GRADE I DIASTOLIC DYSFUNCTION/ AORTIC SCLEROSIS WITHOUT STENOSIS/  RV  SYSTOLIC  MILDLY REDUCED FUNCTION  . Cardiovascular stress test  10-15-2010    LOW RISK NUCLEAR STUDY/ NO EVIDENCE OF ISCHEMIA/ NORMAL EF  . Shoulder surgery  1970's    "don't remember which side or what kind of OR"  . Transurethral resection of bladder tumor  10/07/2011    Procedure: TRANSURETHRAL RESECTION OF BLADDER TUMOR (TURBT);  Surgeon: Malka So, MD;  Location: [facility];  Service: Urology;  Laterality: N/A;  . Cystoscopy  10/07/2011    Procedure: CYSTOSCOPY;  Surgeon: Malka So, MD;  Location: [facility];  Service: Urology;  Laterality: N/A;  . Cystoscopy with biopsy  03/14/2012    Procedure: CYSTOSCOPY WITH BIOPSY;  Surgeon: Malka So, MD;  Location: [facility];  Service: Urology;  Laterality: N/A;  WITH FULGURATION  . Transurethral resection of prostate  03/14/2012    Procedure: TRANSURETHRAL RESECTION OF THE PROSTATE WITH GYRUS INSTRUMENTS;  Surgeon: Malka So, MD;  Location: [facility];  Service: Urology;  Laterality: N/A;  . Tonsillectomy    . Cataract extraction w/ intraocular lens  implant, bilateral Bilateral   . Cardiac catheterization  05-11-2004  DR [place] PLAQUE/ NORMAL LVF/ EF 55%/  NON-OBSTRUCTIVE LAD 25%  . Cardiac catheterization  06/2012  . Eye surgery    . Amputation Left 10/16/2014    Procedure: AMPUTATION ABOVE KNEE- LEFT;   Surgeon: Mal Misty, MD;  Location: [facility];  Service: Vascular;  Laterality: Left;    There were no vitals filed for this visit.  Visit Diagnosis:  Abnormality of gait  Functional weakness  Unsteadiness  Balance problems  Encounter for prosthetic gait training  Decreased functional activity tolerance  Status post above knee amputation of left lower extremity (HCC)      Subjective Assessment - 02/18/15 1021    Subjective Scott [name] has walked some with aide but liimited. Scott [name] is using RW & prosthesis for car transfers.    Currently in Pain? No/denies                         [facility] Adult Scott [name] Treatment/Exercise - 02/18/15 0001    Transfers   Sit to Stand 5: Supervision;With upper extremity assist;With armrests;From chair/3-in-1  from rollator seat   Sit to Stand Details (indicate cue type and reason) verbal cues on rollator walker brake safety   Stand to Sit 5: Supervision;With upper extremity assist;To chair/3-in-1;Other (comment)  from rollator walker seat   Stand to Sit Details verbal cues on rollator safety   Stand Pivot Transfers 5: Supervision   Stand Pivot Transfer Details (indicate cue type and reason) turning 180* to sit /stand from rollator walker seat   Ambulation/Gait   Ambulation/Gait Yes   Ambulation/Gait Assistance 5: Supervision   Ambulation/Gait Assistance Details verbal cues on rollator walker safety including position within walker for control, hand placement for safer brake control; CNA instructed also to improve carryover & safety outside of Scott [name]. Scott [name] recommends standard RW in home and rollator walker for community to enable rest periods    Ambulation Distance (Feet) 175 Feet  175' with rollator walker and 150' with RW   Assistive device Prosthesis;Rollator;Rolling walker   Gait Pattern Step-to pattern;Decreased step length - right;Decreased stance time - left;Decreased stride length;Decreased hip/knee flexion - left;Decreased weight shift to  left;Left hip hike;Left circumduction;Trunk flexed;Abducted - left;Poor foot clearance - left   Ambulation Surface Indoor;Level   Ramp 4: Min assist  rollator walker & prosthesis   Ramp Details (indicate cue type and reason) verbal & tactile cues on rollator walker control & sequence    Curb 4: Min assist  rollator walker & prosthesis   Curb Details (indicate cue type and reason) verbal & tactile cues on rollator walker control & wt shift   Prosthetics   Current prosthetic wear tolerance (days/week)  daily   Current prosthetic wear tolerance (#hours/day)  wearing >90% of awake hours                Scott [name] Education -  02/18/15 1100    Education provided Yes   Education Details need to increase activity level with recommendation for increased frequency of shorter household ambulation and at least one walk/day to his maximal tolerance   Person(s) Educated Scott [name];Caregiver(s)   Methods Explanation   Comprehension Verbalized understanding;Need further instruction          Scott [name] Short Term Goals - 02/18/15 1100    Scott [name] SHORT TERM GOAL #1   Title Scott [name] donnes prosthesis correctly modified independent. (Target Date: 01/15/2015)   Baseline MET 02/11/2015   Time 1   Period Months   Status Achieved   Scott [name] SHORT TERM GOAL #2   Title Scott [name] tolerates wear >10hrs total per day without skin issues or limb pain.  (Target Date: 01/15/2015)   Baseline MET 01/13/2015 Scott [name] reports wear 5hrs 2x/day with  skin issues or limb pain.    Time 1   Period Months   Status Achieved   Scott [name] SHORT TERM GOAL #3   Title Scott [name] performs standing balance with RW & prosthesis reaching 10", to floor and manages pants for toileting modified inependent.  (Target Date: 01/15/2015)   Baseline Partially MET 01/13/2015 Scott [name] performs standing balance with RW & prosthesis reaching 10", to floor and simulates managing pants with supervision.    Time 1   Period Months   Status Partially Met   Scott [name] SHORT TERM GOAL #4    Title Scott [name] ambulates 150' with RW & prosthesis with SBA.  (NEW Target Date: 03/14/2015)   Baseline --   Time 1   Period Months   Status Revised   Scott [name] SHORT TERM GOAL #5   Title Scott [name] negotiates stairs (2 rails), ramps & curbs with RW & prosthesis with supervision.  (NEW Target Date: 03/14/2015)   Time 1   Period Months   Status Revised           Scott [name] Long Term Goals - 02/18/15 1100    Scott [name] LONG TERM GOAL #1   Title Scott [name] verbalizes proper prosthetic care including donning.   NEW Target Date 04/11/2015   Baseline Partially MET 02/11/2015  Scott [name] donnes prosthesis modified independent. Scott [name] requires cues for adjusting ply socks and addressing issues to call prosthetist.    Time 2   Period Months   Status Partially Met   Scott [name] LONG TERM GOAL #2   Title Scott [name] tolerates wear of prosthesis >80% of awake hours without skin issues or limb pain.  (Target Date 02/14/2015)   Baseline MET 02/11/2015   Time 2   Period Months   Status Achieved   Scott [name] LONG TERM GOAL #3   Title [place] Balance >/= 20/56  (Target Date: 02/14/2015) NEW Target Date 04/11/2015   Baseline 17/56 on 02/14/15   Time 2   Period Months   Status Revised   Scott [name] LONG TERM GOAL #4   Title Scott [name] ambulates 250' with rolling walker & prosthesis with supervision to enable community mobility.  ( NEW Target Date 04/11/2015)   Time 2   Period Months   Status On-going   Scott [name] LONG TERM GOAL #5   Title Scott [name] negotiates stairs, ramps & curbs with rolling walker & prosthesis with supervision to enable community mobility.  (NEW Target Date: 04/11/2015)   Time 2   Period Months   Status On-going   Scott [name] LONG TERM GOAL #6   Title Scott [name] ambulates household around furniture with rolling walker & prosthesis modified independent.  (Target Date: 12/30//2016)   Baseline S level 02/14/15  Time 2   Period Months   Status On-going               Plan - 02/18/15 1100    Clinical Impression Statement Scott [name] continues to need skilled  instruction for safety on barriers of ramps & curbs. His CNA has better understanding how to assist him on ramps & curbs with rollator walker. Scott [name] needs to increase activity level outside of Scott [name].    Scott [name] will benefit from skilled therapeutic intervention in order to improve on the following deficits Abnormal gait;Decreased activity tolerance;Decreased balance;Decreased endurance;Decreased knowledge of use of DME;Decreased mobility;Decreased range of motion;Decreased skin integrity;Decreased strength;Postural dysfunction;Prosthetic Dependency   Rehab Potential Good   Scott [name] Frequency 2x / week   Scott [name] Duration Other (comment)  9 weeks (60 days)   Scott [name] Treatment/Interventions ADLs/Self Care Home Management;DME Instruction;Gait training;Stair training;Functional mobility training;Therapeutic activities;Therapeutic exercise;Balance training;Neuromuscular re-education;Scott [name]/family education;Prosthetic Training;Manual techniques   Scott [name] Next Visit Plan Continue to address gait in household surfaces,  hip flex stretch, negotiation up/down curb, ramp and stairs.    Consulted and Agree with Plan of Care Scott [name]   Family Member Consulted aid        Problem List Scott [name] Active Problem List   Diagnosis Date Noted  . Status post above knee amputation of left lower extremity ([place]) 10/18/2014  . Tachyarrhythmia 10/05/2014  . Aspiration pneumonia ([place]) 10/05/2014  . UTI (lower urinary tract infection) 10/03/2014  . Sepsis due to other etiology ([place]) 10/03/2014  . Gangrene of toe ([place]) 10/03/2014  . Ischemic ulcer of left foot ([place]) 10/03/2014  . Atherosclerotic PVD with ulceration ([place]) 10/03/2014  . Warfarin-induced coagulopathy ([place]) 10/03/2014  . Atrial fibrillation with RVR ([place]) 10/03/2014  . Acute kidney injury ([place]) 10/03/2014  . Hyperkalemia 10/03/2014  . Cellulitis of left leg 10/03/2014  . PAD (peripheral artery disease) ([place]) 09/24/2014  . NSTEMI (non-ST elevated myocardial infarction) ([place])  10/09/2013  . High risk medication use 05/17/2013  . BPH (benign prostatic hyperplasia) 05/17/2013  . Vitamin D deficiency 05/17/2013  . History of pulmonary embolism (2014) 04/17/2013  . NSVT (nonsustained ventricular tachycardia)- not an ICD candidate 04/17/2013  . NICM (nonischemic cardiomyopathy)- EF 20-25% by Echo 04/12/13 04/17/2013  . Chronic diastolic heart failure, NYHA class 1 ([place]) 03/15/2013  . DVT of lower extremity, bilateral ([place]) 03/15/2013  . Fatigue 11/02/2012  . Constipation 11/02/2012  . Alcohol abuse 08/27/2012  . Syncope 06/21/2012  . Bladder cancer ([place]) 03/15/2012  . Benign localized hyperplasia of prostate with urinary obstruction 03/15/2012  . Benign essential HTN   . Hyperlipidemia   . PVCs (premature ventricular contractions)   . CAD- non obstructive disease by cath 3/14     Scott [name] Scott [name], DPT 02/18/2015, 11:58 PM  [facility] [address] [place], [place], [address] Phone: [phone]   Fax:  [phone]  Name: Scott [name] MRN: [id number] Date of Birth: Aug 12, 1921

## 2015-02-19 ENCOUNTER — Other Ambulatory Visit: Payer: Self-pay | Admitting: Family Medicine

## 2015-02-20 ENCOUNTER — Ambulatory Visit: Payer: Medicare Other | Admitting: Rehabilitation

## 2015-02-20 ENCOUNTER — Encounter: Payer: Self-pay | Admitting: Rehabilitation

## 2015-02-20 DIAGNOSIS — R29818 Other symptoms and signs involving the nervous system: Secondary | ICD-10-CM | POA: Diagnosis not present

## 2015-02-20 DIAGNOSIS — R531 Weakness: Secondary | ICD-10-CM

## 2015-02-20 DIAGNOSIS — R2689 Other abnormalities of gait and mobility: Secondary | ICD-10-CM

## 2015-02-20 DIAGNOSIS — Z5189 Encounter for other specified aftercare: Secondary | ICD-10-CM | POA: Diagnosis not present

## 2015-02-20 DIAGNOSIS — R2681 Unsteadiness on feet: Secondary | ICD-10-CM | POA: Diagnosis not present

## 2015-02-20 DIAGNOSIS — R269 Unspecified abnormalities of gait and mobility: Secondary | ICD-10-CM

## 2015-02-20 DIAGNOSIS — Z89612 Acquired absence of left leg above knee: Secondary | ICD-10-CM

## 2015-02-20 DIAGNOSIS — R6889 Other general symptoms and signs: Secondary | ICD-10-CM | POA: Diagnosis not present

## 2015-02-20 NOTE — Therapy (Signed)
[facility] [address] [place], [place], [address] Phone: [phone]   Fax:  [phone]  Physical Therapy Treatment  Patient Details  Name: Scott [name] MRN: [id number] Date of Birth: Sep 14, 1921 Referring Provider: Redge Gainer, MD  Encounter Date: 02/20/2015      PT End of Session - 02/20/15 1255    Visit Number 16   Number of Visits 30   Date for PT Re-Evaluation 04/15/15   Authorization Type Medicare Do G-code every 10th visit   PT Start Time 1100   PT Stop Time 1145   PT Time Calculation (min) 45 min   Equipment Utilized During Treatment Gait belt   Activity Tolerance Patient tolerated treatment well   Behavior During Therapy [facility] for tasks assessed/performed      Past Medical History  Diagnosis Date  . HTN (hypertension)   . Hyperlipidemia   . PVCs (premature ventricular contractions)   . Glaucoma   . PVD (peripheral vascular disease) (HCC)     Right ABI .75, Left .78 (2006)  . Popliteal artery aneurysm, bilateral (HCC) DECUMENTED CHRONIC PARTIAL OCCLUSION--  PT DENIES CLAUDICATION OR ANY OTHER SYMPTOMS  . Frequency of urination   . Nocturia   . DVT, bilateral lower limbs ([place]) 03/2013    a. Bilat DVT dx 03/2013.  . Pulmonary embolism ([place])     a. By CT angio 04/2013.  Marland Kitchen Elevated troponin     a. Adm 12/30-04/2013 - not felt to represent ACS; ?due to PE. b. Patent cors 06/2012.  Marland Kitchen NSVT (nonsustained ventricular tachycardia) ([place])     a. NSVT 06/2012; NSVT also seen during 03/2013 adm. b. Med rx. Not candidate for ICD given adv age.  Marland Kitchen ETOH abuse   . Syncope     a. Felt to be postural syncope related to diuretics 06/2012.  Marland Kitchen Heart murmur   . DVT (deep venous thrombosis) ([place])     "I think in both legs"  . Pneumonia     "couple times"  . DDD (degenerative disc disease)   . Arthritis     "all over"  . Depression   . CAD- non obstructive disease by cath 3/14   . PAD (peripheral artery disease)  ([place])   . Gangrene of toe ([place]) 10/03/2014  . Sepsis due to other etiology ([place]) 10/03/2014  . Chronic systolic heart failure ([place]) 10/04/14    EF improved 50-55%  . Chronic systolic CHF (congestive heart failure) ([place])     a. NICM - patent cors 06/2012, EF 40% at that time. b. 03/2013 eval: EF 20-25%.  . Chronic lower back pain   . Bladder cancer ([place]) 03/15/2012    pt denies this hx on 10/15/2014    Past Surgical History  Procedure Laterality Date  . Transthoracic echocardiogram  09-22-2010    MODERATE LVH/ EF 88%/ GRADE I DIASTOLIC DYSFUNCTION/ AORTIC SCLEROSIS WITHOUT STENOSIS/  RV  SYSTOLIC  MILDLY REDUCED FUNCTION  . Cardiovascular stress test  10-15-2010    LOW RISK NUCLEAR STUDY/ NO EVIDENCE OF ISCHEMIA/ NORMAL EF  . Shoulder surgery  1970's    "don't remember which side or what kind of OR"  . Transurethral resection of bladder tumor  10/07/2011    Procedure: TRANSURETHRAL RESECTION OF BLADDER TUMOR (TURBT);  Surgeon: Malka So, MD;  Location: [facility];  Service: Urology;  Laterality: N/A;  . Cystoscopy  10/07/2011    Procedure: CYSTOSCOPY;  Surgeon: Malka So, MD;  Location: [facility];  Service: Urology;  Laterality: N/A;  . Cystoscopy with biopsy  03/14/2012    Procedure: CYSTOSCOPY WITH BIOPSY;  Surgeon: Malka So, MD;  Location: [facility];  Service: Urology;  Laterality: N/A;  WITH FULGURATION  . Transurethral resection of prostate  03/14/2012    Procedure: TRANSURETHRAL RESECTION OF THE PROSTATE WITH GYRUS INSTRUMENTS;  Surgeon: Malka So, MD;  Location: [facility];  Service: Urology;  Laterality: N/A;  . Tonsillectomy    . Cataract extraction w/ intraocular lens  implant, bilateral Bilateral   . Cardiac catheterization  05-11-2004  DR [place] PLAQUE/ NORMAL LVF/ EF 55%/  NON-OBSTRUCTIVE LAD 25%  . Cardiac catheterization  06/2012  . Eye surgery    . Amputation Left 10/16/2014    Procedure: AMPUTATION ABOVE KNEE- LEFT;   Surgeon: Mal Misty, MD;  Location: [place];  Service: Vascular;  Laterality: Left;    There were no vitals filed for this visit.  Visit Diagnosis:  Abnormality of gait  Unsteadiness  Functional weakness  Balance problems  Status post above knee amputation of left lower extremity (HCC)      Subjective Assessment - 02/20/15 1107    Subjective "I'm having some back pain this morning, but I took my Oxycodone."     Patient Stated Goals He wants to use prosthesis to walk in home & community.    Currently in Pain? Yes   Pain Score 4    Pain Location Back   Pain Orientation Lower   Pain Descriptors / Indicators Aching   Pain Type Acute pain   Pain Onset Today   Pain Frequency Intermittent   Aggravating Factors  walking   Pain Relieving Factors rest, pain meds              Gait and Prosthetic training:  Performed ambulation from waiting area to therapy mat x 75' with use of RW at min A level.  Was S overall, however did have one instance that LLE did not fully clear and pts LEs way behind UEs/trunk and requires cues and min A to correct.  Continued to focus on gait training with L prosthesis for increased distance, improved quality and more upright posture.  Performed 1 rep of 9' and another rep of 63' at S level.  Requires constant cues for upright posture and constant cues for improved safety and technique when sitting.  He continues to want to keep LLE extended in front of him instead of bringing LLE under him, increasing weight to toe to unlock knee and then reaching back to sit.  Also performed curb/ramp with use of RW, again requires min/guard A with cues for safety when negotiating up curb as he did not recall to step LLE through as to prevent LOB posteriorly on curb step.  Pt asking whether he could walk up ramp at home.  Provided education that he and caregiver could perform together as she has done in therapy.  Both verbalized understanding.    Therex:  Educated and  performed L hip flex stretch while in supine with LLE off EOM.  Performed x 2 reps of 2 mins with cues on how to carryover at home and perform on how to position in his bed.                       PT Education - 02/20/15 1114    Education provided Yes   Education Details Continue to  educate on the need to increase activity level and increased frequency of short bouts of household ambulation.    Person(s) Educated Patient;Caregiver(s)   Methods Explanation   Comprehension Verbalized understanding          PT Short Term Goals - 02/18/15 1100    PT SHORT TERM GOAL #1   Title Patient donnes prosthesis correctly modified independent. (Target Date: 01/15/2015)   Baseline MET 02/11/2015   Time 1   Period Months   Status Achieved   PT SHORT TERM GOAL #2   Title Patient tolerates wear >10hrs total per day without skin issues or limb pain.  (Target Date: 01/15/2015)   Baseline MET 01/13/2015 Patient reports wear 5hrs 2x/day with  skin issues or limb pain.    Time 1   Period Months   Status Achieved   PT SHORT TERM GOAL #3   Title Patient performs standing balance with RW & prosthesis reaching 10", to floor and manages pants for toileting modified inependent.  (Target Date: 01/15/2015)   Baseline Partially MET 01/13/2015 Patient performs standing balance with RW & prosthesis reaching 10", to floor and simulates managing pants with supervision.    Time 1   Period Months   Status Partially Met   PT SHORT TERM GOAL #4   Title Patient ambulates 150' with RW & prosthesis with SBA.  (NEW Target Date: 03/14/2015)   Baseline --   Time 1   Period Months   Status Revised   PT SHORT TERM GOAL #5   Title Patient negotiates stairs (2 rails), ramps & curbs with RW & prosthesis with supervision.  (NEW Target Date: 03/14/2015)   Time 1   Period Months   Status Revised           PT Long Term Goals - 02/18/15 1100    PT LONG TERM GOAL #1   Title Patient verbalizes proper prosthetic care  including donning.   NEW Target Date 04/11/2015   Baseline Partially MET 02/11/2015  Patient donnes prosthesis modified independent. He requires cues for adjusting ply socks and addressing issues to call prosthetist.    Time 2   Period Months   Status Partially Met   PT LONG TERM GOAL #2   Title Patient tolerates wear of prosthesis >80% of awake hours without skin issues or limb pain.  (Target Date 02/14/2015)   Baseline MET 02/11/2015   Time 2   Period Months   Status Achieved   PT LONG TERM GOAL #3   Title Berg Balance >/= 20/56  (Target Date: 02/14/2015) NEW Target Date 04/11/2015   Baseline 17/56 on 02/14/15   Time 2   Period Months   Status Revised   PT LONG TERM GOAL #4   Title Patient ambulates 250' with rolling walker & prosthesis with supervision to enable community mobility.  ( NEW Target Date 04/11/2015)   Time 2   Period Months   Status On-going   PT LONG TERM GOAL #5   Title Patient negotiates stairs, ramps & curbs with rolling walker & prosthesis with supervision to enable community mobility.  (NEW Target Date: 04/11/2015)   Time 2   Period Months   Status On-going   PT LONG TERM GOAL #6   Title Patient ambulates household around furniture with rolling walker & prosthesis modified independent.  (Target Date: 12/30//2016)   Baseline S level 02/14/15   Time 2   Period Months   Status On-going  Plan - 02/20/15 1256    Clinical Impression Statement Pt continues to make slow but steady progress towards goals.  Feel that he is mostly limited by decreased amount of activity at home as wife's caregivers will not provide S for gait for pt in house, therefore pt limited to 5 hour window when his aide can assist with gait.     Pt will benefit from skilled therapeutic intervention in order to improve on the following deficits Abnormal gait;Decreased activity tolerance;Decreased balance;Decreased endurance;Decreased knowledge of use of DME;Decreased  mobility;Decreased range of motion;Decreased skin integrity;Decreased strength;Postural dysfunction;Prosthetic Dependency   Rehab Potential Good   PT Frequency 2x / week   PT Duration Other (comment)  9 weeks (60 days)   PT Treatment/Interventions ADLs/Self Care Home Management;DME Instruction;Gait training;Stair training;Functional mobility training;Therapeutic activities;Therapeutic exercise;Balance training;Neuromuscular re-education;Patient/family education;Prosthetic Training;Manual techniques   PT Next Visit Plan Continue to address gait in household surfaces,  hip flex stretch, negotiation up/down curb, ramp and stairs.    Consulted and Agree with Plan of Care Patient   Family Member Consulted aid        Problem List Patient Active Problem List   Diagnosis Date Noted  . Status post above knee amputation of left lower extremity ([place]) 10/18/2014  . Tachyarrhythmia 10/05/2014  . Aspiration pneumonia ([place]) 10/05/2014  . UTI (lower urinary tract infection) 10/03/2014  . Sepsis due to other etiology ([place]) 10/03/2014  . Gangrene of toe ([place]) 10/03/2014  . Ischemic ulcer of left foot ([place]) 10/03/2014  . Atherosclerotic PVD with ulceration ([place]) 10/03/2014  . Warfarin-induced coagulopathy ([place]) 10/03/2014  . Atrial fibrillation with RVR ([place]) 10/03/2014  . Acute kidney injury ([place]) 10/03/2014  . Hyperkalemia 10/03/2014  . Cellulitis of left leg 10/03/2014  . PAD (peripheral artery disease) ([place]) 09/24/2014  . NSTEMI (non-ST elevated myocardial infarction) ([place]) 10/09/2013  . High risk medication use 05/17/2013  . BPH (benign prostatic hyperplasia) 05/17/2013  . Vitamin D deficiency 05/17/2013  . History of pulmonary embolism (2014) 04/17/2013  . NSVT (nonsustained ventricular tachycardia)- not an ICD candidate 04/17/2013  . NICM (nonischemic cardiomyopathy)- EF 20-25% by Echo 04/12/13 04/17/2013  . Chronic diastolic heart failure, NYHA class 1 ([place]) 03/15/2013  . DVT of lower  extremity, bilateral ([place]) 03/15/2013  . Fatigue 11/02/2012  . Constipation 11/02/2012  . Alcohol abuse 08/27/2012  . Syncope 06/21/2012  . Bladder cancer ([place]) 03/15/2012  . Benign localized hyperplasia of prostate with urinary obstruction 03/15/2012  . Benign essential HTN   . Hyperlipidemia   . PVCs (premature ventricular contractions)   . CAD- non obstructive disease by cath 3/14     Cameron Sprang, PT, MPT [facility] [address] [place], [place], [address] Phone: [phone]   Fax:  [phone] 02/20/2015, 12:58 PM  Name: Scott [name] MRN: [id number] Date of Birth: 09-Jan-1922

## 2015-02-21 ENCOUNTER — Ambulatory Visit: Payer: Medicare Other | Admitting: Physical Therapy

## 2015-02-25 ENCOUNTER — Ambulatory Visit: Payer: Medicare Other | Admitting: Physical Therapy

## 2015-02-25 ENCOUNTER — Encounter: Payer: Self-pay | Admitting: Physical Therapy

## 2015-02-25 DIAGNOSIS — R2681 Unsteadiness on feet: Secondary | ICD-10-CM

## 2015-02-25 DIAGNOSIS — Z5189 Encounter for other specified aftercare: Secondary | ICD-10-CM | POA: Diagnosis not present

## 2015-02-25 DIAGNOSIS — R29818 Other symptoms and signs involving the nervous system: Secondary | ICD-10-CM | POA: Diagnosis not present

## 2015-02-25 DIAGNOSIS — Z4789 Encounter for other orthopedic aftercare: Secondary | ICD-10-CM

## 2015-02-25 DIAGNOSIS — R269 Unspecified abnormalities of gait and mobility: Secondary | ICD-10-CM | POA: Diagnosis not present

## 2015-02-25 DIAGNOSIS — R531 Weakness: Secondary | ICD-10-CM | POA: Diagnosis not present

## 2015-02-25 DIAGNOSIS — R6889 Other general symptoms and signs: Secondary | ICD-10-CM

## 2015-02-25 DIAGNOSIS — R2689 Other abnormalities of gait and mobility: Secondary | ICD-10-CM

## 2015-02-25 DIAGNOSIS — Z89612 Acquired absence of left leg above knee: Secondary | ICD-10-CM

## 2015-02-25 LAB — POCT INR: INR: 2.7

## 2015-02-25 NOTE — Therapy (Signed)
[facility] [address] [place], [place], [address] Phone: [phone]   Fax:  [phone]  Physical Therapy Treatment  Patient Details  Name: Scott [name] MRN: [id number] Date of Birth: 1921/09/04 Referring Provider: Redge Gainer, MD  Encounter Date: 02/25/2015      PT End of Session - 02/25/15 1150    Visit Number 17   Number of Visits 30   Date for PT Re-Evaluation 04/15/15   Authorization Type Medicare Do G-code every 10th visit   PT Start Time 1101   PT Stop Time 1149   PT Time Calculation (min) 48 min   Equipment Utilized During Treatment Gait belt   Activity Tolerance Patient tolerated treatment well   Behavior During Therapy [facility] for tasks assessed/performed      Past Medical History  Diagnosis Date  . HTN (hypertension)   . Hyperlipidemia   . PVCs (premature ventricular contractions)   . Glaucoma   . PVD (peripheral vascular disease) (HCC)     Right ABI .75, Left .78 (2006)  . Popliteal artery aneurysm, bilateral (HCC) DECUMENTED CHRONIC PARTIAL OCCLUSION--  PT DENIES CLAUDICATION OR ANY OTHER SYMPTOMS  . Frequency of urination   . Nocturia   . DVT, bilateral lower limbs ([place]) 03/2013    a. Bilat DVT dx 03/2013.  . Pulmonary embolism ([place])     a. By CT angio 04/2013.  Marland Kitchen Elevated troponin     a. Adm 12/30-04/2013 - not felt to represent ACS; ?due to PE. b. Patent cors 06/2012.  Marland Kitchen NSVT (nonsustained ventricular tachycardia) ([place])     a. NSVT 06/2012; NSVT also seen during 03/2013 adm. b. Med rx. Not candidate for ICD given adv age.  Marland Kitchen ETOH abuse   . Syncope     a. Felt to be postural syncope related to diuretics 06/2012.  Marland Kitchen Heart murmur   . DVT (deep venous thrombosis) ([place])     "I think in both legs"  . Pneumonia     "couple times"  . DDD (degenerative disc disease)   . Arthritis     "all over"  . Depression   . CAD- non obstructive disease by cath 3/14   . PAD (peripheral artery disease)  ([place])   . Gangrene of toe ([place]) 10/03/2014  . Sepsis due to other etiology ([place]) 10/03/2014  . Chronic systolic heart failure ([place]) 10/04/14    EF improved 50-55%  . Chronic systolic CHF (congestive heart failure) ([place])     a. NICM - patent cors 06/2012, EF 40% at that time. b. 03/2013 eval: EF 20-25%.  . Chronic lower back pain   . Bladder cancer ([place]) 03/15/2012    pt denies this hx on 10/15/2014    Past Surgical History  Procedure Laterality Date  . Transthoracic echocardiogram  09-22-2010    MODERATE LVH/ EF 01%/ GRADE I DIASTOLIC DYSFUNCTION/ AORTIC SCLEROSIS WITHOUT STENOSIS/  RV  SYSTOLIC  MILDLY REDUCED FUNCTION  . Cardiovascular stress test  10-15-2010    LOW RISK NUCLEAR STUDY/ NO EVIDENCE OF ISCHEMIA/ NORMAL EF  . Shoulder surgery  1970's    "don't remember which side or what kind of OR"  . Transurethral resection of bladder tumor  10/07/2011    Procedure: TRANSURETHRAL RESECTION OF BLADDER TUMOR (TURBT);  Surgeon: Malka So, MD;  Location: [facility];  Service: Urology;  Laterality: N/A;  . Cystoscopy  10/07/2011    Procedure: CYSTOSCOPY;  Surgeon: Malka So, MD;  Location: [facility];  Service: Urology;  Laterality: N/A;  . Cystoscopy with biopsy  03/14/2012    Procedure: CYSTOSCOPY WITH BIOPSY;  Surgeon: Malka So, MD;  Location: [facility];  Service: Urology;  Laterality: N/A;  WITH FULGURATION  . Transurethral resection of prostate  03/14/2012    Procedure: TRANSURETHRAL RESECTION OF THE PROSTATE WITH GYRUS INSTRUMENTS;  Surgeon: Malka So, MD;  Location: [facility];  Service: Urology;  Laterality: N/A;  . Tonsillectomy    . Cataract extraction w/ intraocular lens  implant, bilateral Bilateral   . Cardiac catheterization  05-11-2004  DR [place] PLAQUE/ NORMAL LVF/ EF 55%/  NON-OBSTRUCTIVE LAD 25%  . Cardiac catheterization  06/2012  . Eye surgery    . Amputation Left 10/16/2014    Procedure: AMPUTATION ABOVE KNEE- LEFT;   Surgeon: Mal Misty, MD;  Location: [facility];  Service: Vascular;  Laterality: Left;    There were no vitals filed for this visit.  Visit Diagnosis:  Abnormality of gait  Unsteadiness  Functional weakness  Balance problems  Status post above knee amputation of left lower extremity ([place])  Encounter for prosthetic gait training  Decreased functional activity tolerance      Subjective Assessment - 02/25/15 1257    Subjective He fell transfering to toilet in morning prior to donning prosthesis. Denies injuries. CNA moved [facility] to toilet as raised commode and it appears safer. His back is better, back to his normal "old age stuff" He only walks with  his aid Levada Dy as his wife's aides still will not walk with him.    Currently in Pain? No/denies                         [facility] Adult PT Treatment/Exercise - 02/25/15 1100    Transfers   Sit to Stand 5: Supervision;With upper extremity assist;With armrests;From chair/3-in-1  from rollator seat   Sit to Stand Details (indicate cue type and reason) verbal cues for rollator walker safety.    Stand to Sit 5: Supervision;With upper extremity assist;To chair/3-in-1;Other (comment)  from rollator walker seat   Stand to Sit Details verbal cues for rollator walker safety   Stand Pivot Transfers 5: Supervision   Stand Pivot Transfer Details (indicate cue type and reason) turning 180* to sit to /from stand from rollator walker seat with supervision. Car transfer with RW with supervision.    Ambulation/Gait   Ambulation/Gait Yes   Ambulation/Gait Assistance 5: Supervision   Ambulation Distance (Feet) 240 Feet  160' & 34' with rollator walker & 66' with RW   Assistive device Prosthesis;Rollator;Rolling walker   Gait Pattern Step-to pattern;Decreased step length - right;Decreased stance time - left;Decreased stride length;Decreased hip/knee flexion - left;Decreased weight shift to left;Left hip hike;Left circumduction;Trunk  flexed;Abducted - left;Poor foot clearance - left   Ambulation Surface Indoor;Level;Outdoor;Paved   Ramp 4: Min assist  rollator walker & prosthesis   Ramp Details (indicate cue type and reason) cues on technique with rollator walker   Curb 4: Min assist  rollator walker & prosthesis   Curb Details (indicate cue type and reason) cues on technique with rollator walker   Prosthetics   Current prosthetic wear tolerance (days/week)  daily   Current prosthetic wear tolerance (#hours/day)  wearing >90% of awake hours   Donning Prosthesis Modified independent (device/increased time)   Doffing Prosthesis Modified independent (device/increased time)  PT Short Term Goals - 02/18/15 1100    PT SHORT TERM GOAL #1   Title Patient donnes prosthesis correctly modified independent. (Target Date: 01/15/2015)   Baseline MET 02/11/2015   Time 1   Period Months   Status Achieved   PT SHORT TERM GOAL #2   Title Patient tolerates wear >10hrs total per day without skin issues or limb pain.  (Target Date: 01/15/2015)   Baseline MET 01/13/2015 Patient reports wear 5hrs 2x/day with  skin issues or limb pain.    Time 1   Period Months   Status Achieved   PT SHORT TERM GOAL #3   Title Patient performs standing balance with RW & prosthesis reaching 10", to floor and manages pants for toileting modified inependent.  (Target Date: 01/15/2015)   Baseline Partially MET 01/13/2015 Patient performs standing balance with RW & prosthesis reaching 10", to floor and simulates managing pants with supervision.    Time 1   Period Months   Status Partially Met   PT SHORT TERM GOAL #4   Title Patient ambulates 150' with RW & prosthesis with SBA.  (NEW Target Date: 03/14/2015)   Baseline --   Time 1   Period Months   Status Revised   PT SHORT TERM GOAL #5   Title Patient negotiates stairs (2 rails), ramps & curbs with RW & prosthesis with supervision.  (NEW Target Date: 03/14/2015)   Time 1   Period  Months   Status Revised           PT Long Term Goals - 02/18/15 1100    PT LONG TERM GOAL #1   Title Patient verbalizes proper prosthetic care including donning.   NEW Target Date 04/11/2015   Baseline Partially MET 02/11/2015  Patient donnes prosthesis modified independent. He requires cues for adjusting ply socks and addressing issues to call prosthetist.    Time 2   Period Months   Status Partially Met   PT LONG TERM GOAL #2   Title Patient tolerates wear of prosthesis >80% of awake hours without skin issues or limb pain.  (Target Date 02/14/2015)   Baseline MET 02/11/2015   Time 2   Period Months   Status Achieved   PT LONG TERM GOAL #3   Title [place] Balance >/= 20/56  (Target Date: 02/14/2015) NEW Target Date 04/11/2015   Baseline 17/56 on 02/14/15   Time 2   Period Months   Status Revised   PT LONG TERM GOAL #4   Title Patient ambulates 250' with rolling walker & prosthesis with supervision to enable community mobility.  ( NEW Target Date 04/11/2015)   Time 2   Period Months   Status On-going   PT LONG TERM GOAL #5   Title Patient negotiates stairs, ramps & curbs with rolling walker & prosthesis with supervision to enable community mobility.  (NEW Target Date: 04/11/2015)   Time 2   Period Months   Status On-going   PT LONG TERM GOAL #6   Title Patient ambulates household around furniture with rolling walker & prosthesis modified independent.  (Target Date: 12/30//2016)   Baseline S level 02/14/15   Time 2   Period Months   Status On-going               Plan - 02/25/15 1311    Clinical Impression Statement Patient ambulated from gym to his car in parking lot with PT supervision / contact assist and he verbalized he now knows this is an option  with CNA when they go out. Patient needs skilled instruction for safety for negotiating barriers and porgressing acitivity level.    Pt will benefit from skilled therapeutic intervention in order to improve on the following  deficits Abnormal gait;Decreased activity tolerance;Decreased balance;Decreased endurance;Decreased knowledge of use of DME;Decreased mobility;Decreased range of motion;Decreased skin integrity;Decreased strength;Postural dysfunction;Prosthetic Dependency   Rehab Potential Good   PT Frequency 2x / week   PT Duration Other (comment)  9 weeks (60 days)   PT Treatment/Interventions ADLs/Self Care Home Management;DME Instruction;Gait training;Stair training;Functional mobility training;Therapeutic activities;Therapeutic exercise;Balance training;Neuromuscular re-education;Patient/family education;Prosthetic Training;Manual techniques   PT Next Visit Plan Continue to address gait in household surfaces,  hip flex stretch, negotiation up/down curb, ramp and stairs.    Consulted and Agree with Plan of Care Patient   Family Member Consulted aid        Problem List Patient Active Problem List   Diagnosis Date Noted  . Status post above knee amputation of left lower extremity ([place]) 10/18/2014  . Tachyarrhythmia 10/05/2014  . Aspiration pneumonia ([place]) 10/05/2014  . UTI (lower urinary tract infection) 10/03/2014  . Sepsis due to other etiology ([place]) 10/03/2014  . Gangrene of toe ([place]) 10/03/2014  . Ischemic ulcer of left foot ([place]) 10/03/2014  . Atherosclerotic PVD with ulceration ([place]) 10/03/2014  . Warfarin-induced coagulopathy ([place]) 10/03/2014  . Atrial fibrillation with RVR ([place]) 10/03/2014  . Acute kidney injury ([place]) 10/03/2014  . Hyperkalemia 10/03/2014  . Cellulitis of left leg 10/03/2014  . PAD (peripheral artery disease) ([place]) 09/24/2014  . NSTEMI (non-ST elevated myocardial infarction) ([place]) 10/09/2013  . High risk medication use 05/17/2013  . BPH (benign prostatic hyperplasia) 05/17/2013  . Vitamin D deficiency 05/17/2013  . History of pulmonary embolism (2014) 04/17/2013  . NSVT (nonsustained ventricular tachycardia)- not an ICD candidate 04/17/2013  . NICM (nonischemic  cardiomyopathy)- EF 20-25% by Echo 04/12/13 04/17/2013  . Chronic diastolic heart failure, NYHA class 1 ([place]) 03/15/2013  . DVT of lower extremity, bilateral ([place]) 03/15/2013  . Fatigue 11/02/2012  . Constipation 11/02/2012  . Alcohol abuse 08/27/2012  . Syncope 06/21/2012  . Bladder cancer ([place]) 03/15/2012  . Benign localized hyperplasia of prostate with urinary obstruction 03/15/2012  . Benign essential HTN   . Hyperlipidemia   . PVCs (premature ventricular contractions)   . CAD- non obstructive disease by cath 3/14     Laurencia Roma PT, DPT 02/25/2015, 1:15 PM  [facility] [address] [place], [place], [address] Phone: [phone]   Fax:  [phone]  Name: MARTICE DOTY MRN: [id number] Date of Birth: [birth year]

## 2015-02-26 ENCOUNTER — Ambulatory Visit (INDEPENDENT_AMBULATORY_CARE_PROVIDER_SITE_OTHER): Payer: Medicare Other | Admitting: Pharmacist

## 2015-02-26 DIAGNOSIS — I82403 Acute embolism and thrombosis of unspecified deep veins of lower extremity, bilateral: Secondary | ICD-10-CM

## 2015-02-26 DIAGNOSIS — Z86711 Personal history of pulmonary embolism: Secondary | ICD-10-CM | POA: Diagnosis not present

## 2015-02-26 NOTE — Progress Notes (Signed)
Patient checks INR at home with Home INR monitoring.  Billing once per month interupertation fee.  Patient diagnosis - venous thromboembolism.  Procedure code if [id number]

## 2015-02-27 ENCOUNTER — Ambulatory Visit: Payer: Medicare Other | Admitting: Physical Therapy

## 2015-02-27 DIAGNOSIS — R29818 Other symptoms and signs involving the nervous system: Secondary | ICD-10-CM | POA: Diagnosis not present

## 2015-02-27 DIAGNOSIS — R269 Unspecified abnormalities of gait and mobility: Secondary | ICD-10-CM

## 2015-02-27 DIAGNOSIS — R2681 Unsteadiness on feet: Secondary | ICD-10-CM | POA: Diagnosis not present

## 2015-02-27 DIAGNOSIS — Z5189 Encounter for other specified aftercare: Secondary | ICD-10-CM | POA: Diagnosis not present

## 2015-02-27 DIAGNOSIS — R6889 Other general symptoms and signs: Secondary | ICD-10-CM | POA: Diagnosis not present

## 2015-02-27 DIAGNOSIS — Z4789 Encounter for other orthopedic aftercare: Secondary | ICD-10-CM

## 2015-02-27 DIAGNOSIS — R531 Weakness: Secondary | ICD-10-CM | POA: Diagnosis not present

## 2015-02-27 DIAGNOSIS — Z89612 Acquired absence of left leg above knee: Secondary | ICD-10-CM

## 2015-02-27 NOTE — Patient Instructions (Signed)
Hip Flexor Stretch -LEFT    Lying on back near edge of bed, bend your right knee and keep your right foot flat. Hang LEFT other leg over edge, relaxed with thigh over the edge of the bed. You should feel a gentle stretch in the front of your LEFT thigh. Hold for 60 seconds, twice per day on the LEFT leg.

## 2015-02-27 NOTE — Therapy (Signed)
[facility] [address] [place], [place], [address] Phone: [phone]   Fax:  [phone]  Physical Therapy Treatment  Patient Details  Name: Scott [name] MRN: [id number] Date of Birth: 1921/08/09 Referring Provider: Redge Gainer, MD  Encounter Date: 02/27/2015      PT End of Session - 02/27/15 1958    Visit Number 18   Number of Visits 30   Date for PT Re-Evaluation 04/15/15   Authorization Type Medicare Do G-code every 10th visit   PT Start Time 0935   PT Stop Time 1015   PT Time Calculation (min) 40 min   Equipment Utilized During Treatment Gait belt   Activity Tolerance Patient tolerated treatment well   Behavior During Therapy [facility] for tasks assessed/performed      Past Medical History  Diagnosis Date  . HTN (hypertension)   . Hyperlipidemia   . PVCs (premature ventricular contractions)   . Glaucoma   . PVD (peripheral vascular disease) (HCC)     Right ABI .75, Left .78 (2006)  . Popliteal artery aneurysm, bilateral (HCC) DECUMENTED CHRONIC PARTIAL OCCLUSION--  PT DENIES CLAUDICATION OR ANY OTHER SYMPTOMS  . Frequency of urination   . Nocturia   . DVT, bilateral lower limbs ([place]) 03/2013    a. Bilat DVT dx 03/2013.  . Pulmonary embolism ([place])     a. By CT angio 04/2013.  Marland Kitchen Elevated troponin     a. Adm 12/30-04/2013 - not felt to represent ACS; ?due to PE. b. Patent cors 06/2012.  Marland Kitchen NSVT (nonsustained ventricular tachycardia) ([place])     a. NSVT 06/2012; NSVT also seen during 03/2013 adm. b. Med rx. Not candidate for ICD given adv age.  Marland Kitchen ETOH abuse   . Syncope     a. Felt to be postural syncope related to diuretics 06/2012.  Marland Kitchen Heart murmur   . DVT (deep venous thrombosis) ([place])     "I think in both legs"  . Pneumonia     "couple times"  . DDD (degenerative disc disease)   . Arthritis     "all over"  . Depression   . CAD- non obstructive disease by cath 3/14   . PAD (peripheral artery disease)  ([place])   . Gangrene of toe ([place]) 10/03/2014  . Sepsis due to other etiology ([place]) 10/03/2014  . Chronic systolic heart failure ([place]) 10/04/14    EF improved 50-55%  . Chronic systolic CHF (congestive heart failure) ([place])     a. NICM - patent cors 06/2012, EF 40% at that time. b. 03/2013 eval: EF 20-25%.  . Chronic lower back pain   . Bladder cancer ([place]) 03/15/2012    pt denies this hx on 10/15/2014    Past Surgical History  Procedure Laterality Date  . Transthoracic echocardiogram  09-22-2010    MODERATE LVH/ EF 38%/ GRADE I DIASTOLIC DYSFUNCTION/ AORTIC SCLEROSIS WITHOUT STENOSIS/  RV  SYSTOLIC  MILDLY REDUCED FUNCTION  . Cardiovascular stress test  10-15-2010    LOW RISK NUCLEAR STUDY/ NO EVIDENCE OF ISCHEMIA/ NORMAL EF  . Shoulder surgery  1970's    "don't remember which side or what kind of OR"  . Transurethral resection of bladder tumor  10/07/2011    Procedure: TRANSURETHRAL RESECTION OF BLADDER TUMOR (TURBT);  Surgeon: Malka So, MD;  Location: [facility];  Service: Urology;  Laterality: N/A;  . Cystoscopy  10/07/2011    Procedure: CYSTOSCOPY;  Surgeon: Malka So, MD;  Location: [facility];  Service: Urology;  Laterality: N/A;  . Cystoscopy with biopsy  03/14/2012    Procedure: CYSTOSCOPY WITH BIOPSY;  Surgeon: Malka So, MD;  Location: [facility];  Service: Urology;  Laterality: N/A;  WITH FULGURATION  . Transurethral resection of prostate  03/14/2012    Procedure: TRANSURETHRAL RESECTION OF THE PROSTATE WITH GYRUS INSTRUMENTS;  Surgeon: Malka So, MD;  Location: [facility];  Service: Urology;  Laterality: N/A;  . Tonsillectomy    . Cataract extraction w/ intraocular lens  implant, bilateral Bilateral   . Cardiac catheterization  05-11-2004  DR [place] PLAQUE/ NORMAL LVF/ EF 55%/  NON-OBSTRUCTIVE LAD 25%  . Cardiac catheterization  06/2012  . Eye surgery    . Amputation Left 10/16/2014    Procedure: AMPUTATION ABOVE KNEE- LEFT;   Surgeon: Mal Misty, MD;  Location: [facility];  Service: Vascular;  Laterality: Left;    There were no vitals filed for this visit.  Visit Diagnosis:  Abnormality of gait  Unsteadiness  Functional weakness  Status post above knee amputation of left lower extremity ([place])  Encounter for prosthetic gait training      Subjective Assessment - 02/27/15 0939    Subjective Pt/caregiver denies falls, reports no pain or signficant changes.    Patient is accompained by: --  CNA, Angela   Patient Stated Goals He wants to use prosthesis to walk in home & community.    Currently in Pain? No/denies                         [facility] Adult PT Treatment/Exercise - 02/27/15 0001    Transfers   Sit to Stand 5: Supervision;With upper extremity assist;With armrests;From chair/3-in-1  using rolling walker   Sit to Stand Details (indicate cue type and reason) cueing for LLE placement   Stand to Sit 5: Supervision;With upper extremity assist;To chair/3-in-1;Other (comment)  using rolling walker   Stand Pivot Transfers 5: Supervision  using rolling walker   Ambulation/Gait   Ambulation/Gait Yes   Ambulation/Gait Assistance 5: Supervision   Ambulation/Gait Assistance Details Cueing for upright posture, decreased LLE step length, increased LLE step length to promote lateral weight shift to L, step-through pattern   Ambulation Distance (Feet) 265 Feet  2 x75'; x115'   Assistive device Prosthesis;Rollator;Rolling walker   Gait Pattern Step-to pattern;Decreased step length - right;Decreased stance time - left;Decreased stride length;Decreased hip/knee flexion - left;Decreased weight shift to left;Left hip hike;Left circumduction;Trunk flexed;Abducted - left;Poor foot clearance - left   Ambulation Surface Level;Indoor   Ramp 4: Min assist  min guard   Ramp Details (indicate cue type and reason) with rolling walker   Curb 4: Min assist;5: Supervision   Curb Details (indicate cue type and  reason) x4 trials total with RW; initial 2 trials with cueing for proper foot placement, safe proximity to edge of curb step on descent. Pt with effective within-session carryover.   Exercises   Exercises Other Exercises   Other Exercises  Explained and demonstrated L hip flexor self-stretch with effective return demo 2 x60-sec holds. Added to HEP.   Prosthetics   Current prosthetic wear tolerance (days/week)  --   Current prosthetic wear tolerance (#hours/day)  --                PT Education - 02/27/15 1004    Education provided Yes   Education Details Hip flexor stretch provided; see Pt Instructions.  Person(s) Educated Patient;Spouse   Methods Explanation;Demonstration;Handout;Verbal cues   Comprehension Verbalized understanding;Returned demonstration          PT Short Term Goals - 02/18/15 1100    PT SHORT TERM GOAL #1   Title Patient donnes prosthesis correctly modified independent. (Target Date: 01/15/2015)   Baseline MET 02/11/2015   Time 1   Period Months   Status Achieved   PT SHORT TERM GOAL #2   Title Patient tolerates wear >10hrs total per day without skin issues or limb pain.  (Target Date: 01/15/2015)   Baseline MET 01/13/2015 Patient reports wear 5hrs 2x/day with  skin issues or limb pain.    Time 1   Period Months   Status Achieved   PT SHORT TERM GOAL #3   Title Patient performs standing balance with RW & prosthesis reaching 10", to floor and manages pants for toileting modified inependent.  (Target Date: 01/15/2015)   Baseline Partially MET 01/13/2015 Patient performs standing balance with RW & prosthesis reaching 10", to floor and simulates managing pants with supervision.    Time 1   Period Months   Status Partially Met   PT SHORT TERM GOAL #4   Title Patient ambulates 150' with RW & prosthesis with SBA.  (NEW Target Date: 03/14/2015)   Baseline --   Time 1   Period Months   Status Revised   PT SHORT TERM GOAL #5   Title Patient negotiates stairs  (2 rails), ramps & curbs with RW & prosthesis with supervision.  (NEW Target Date: 03/14/2015)   Time 1   Period Months   Status Revised           PT Long Term Goals - 02/18/15 1100    PT LONG TERM GOAL #1   Title Patient verbalizes proper prosthetic care including donning.   NEW Target Date 04/11/2015   Baseline Partially MET 02/11/2015  Patient donnes prosthesis modified independent. He requires cues for adjusting ply socks and addressing issues to call prosthetist.    Time 2   Period Months   Status Partially Met   PT LONG TERM GOAL #2   Title Patient tolerates wear of prosthesis >80% of awake hours without skin issues or limb pain.  (Target Date 02/14/2015)   Baseline MET 02/11/2015   Time 2   Period Months   Status Achieved   PT LONG TERM GOAL #3   Title Berg Balance >/= 20/56  (Target Date: 02/14/2015) NEW Target Date 04/11/2015   Baseline 17/56 on 02/14/15   Time 2   Period Months   Status Revised   PT LONG TERM GOAL #4   Title Patient ambulates 250' with rolling walker & prosthesis with supervision to enable community mobility.  ( NEW Target Date 04/11/2015)   Time 2   Period Months   Status On-going   PT LONG TERM GOAL #5   Title Patient negotiates stairs, ramps & curbs with rolling walker & prosthesis with supervision to enable community mobility.  (NEW Target Date: 04/11/2015)   Time 2   Period Months   Status On-going   PT LONG TERM GOAL #6   Title Patient ambulates household around furniture with rolling walker & prosthesis modified independent.  (Target Date: 12/30//2016)   Baseline S level 02/14/15   Time 2   Period Months   Status On-going               Plan - 02/27/15 1958    Clinical Impression Statement Skilled session focused  on prosthetic gait training and negotiation of community obstacles. Pt continues to require cueing for L step placement, increased R step length, and step-through gait pattern to maximize efficiency and stability of gait.     Pt will benefit from skilled therapeutic intervention in order to improve on the following deficits Abnormal gait;Decreased activity tolerance;Decreased balance;Decreased endurance;Decreased knowledge of use of DME;Decreased mobility;Decreased range of motion;Decreased skin integrity;Decreased strength;Postural dysfunction;Prosthetic Dependency   Rehab Potential Good   PT Frequency 2x / week   PT Duration Other (comment)  9 weeks (60 days)   PT Treatment/Interventions ADLs/Self Care Home Management;DME Instruction;Gait training;Stair training;Functional mobility training;Therapeutic activities;Therapeutic exercise;Balance training;Neuromuscular re-education;Patient/family education;Prosthetic Training;Manual techniques   PT Next Visit Plan Check pt performance of hip flexor stretch; continue to address gait in household surfaces, negotiation up/down curb, ramp and stairs.    Consulted and Agree with Plan of Care Patient   Family Member Consulted aid        Problem List Patient Active Problem List   Diagnosis Date Noted  . Status post above knee amputation of left lower extremity ([place]) 10/18/2014  . Tachyarrhythmia 10/05/2014  . Aspiration pneumonia ([place]) 10/05/2014  . UTI (lower urinary tract infection) 10/03/2014  . Sepsis due to other etiology ([place]) 10/03/2014  . Gangrene of toe ([place]) 10/03/2014  . Ischemic ulcer of left foot ([place]) 10/03/2014  . Atherosclerotic PVD with ulceration ([place]) 10/03/2014  . Warfarin-induced coagulopathy ([place]) 10/03/2014  . Atrial fibrillation with RVR ([place]) 10/03/2014  . Acute kidney injury ([place]) 10/03/2014  . Hyperkalemia 10/03/2014  . Cellulitis of left leg 10/03/2014  . PAD (peripheral artery disease) ([place]) 09/24/2014  . NSTEMI (non-ST elevated myocardial infarction) ([place]) 10/09/2013  . High risk medication use 05/17/2013  . BPH (benign prostatic hyperplasia) 05/17/2013  . Vitamin D deficiency 05/17/2013  . History of pulmonary embolism (2014)  04/17/2013  . NSVT (nonsustained ventricular tachycardia)- not an ICD candidate 04/17/2013  . NICM (nonischemic cardiomyopathy)- EF 20-25% by Echo 04/12/13 04/17/2013  . Chronic diastolic heart failure, NYHA class 1 ([place]) 03/15/2013  . DVT of lower extremity, bilateral ([place]) 03/15/2013  . Fatigue 11/02/2012  . Constipation 11/02/2012  . Alcohol abuse 08/27/2012  . Syncope 06/21/2012  . Bladder cancer ([place]) 03/15/2012  . Benign localized hyperplasia of prostate with urinary obstruction 03/15/2012  . Benign essential HTN   . Hyperlipidemia   . PVCs (premature ventricular contractions)   . CAD- non obstructive disease by cath 3/14     Billie Ruddy, PT, DPT [facility] [address] [place], [place], [address] Phone: [phone]   Fax:  [phone] 02/27/2015, 8:03 PM   Name: Scott [name] MRN: [id number] Date of Birth: 09/22/1921

## 2015-03-01 LAB — POCT INR: INR: 3.7

## 2015-03-03 ENCOUNTER — Ambulatory Visit: Payer: Medicare Other | Admitting: Physical Therapy

## 2015-03-03 ENCOUNTER — Telehealth: Payer: Self-pay | Admitting: *Deleted

## 2015-03-03 ENCOUNTER — Encounter: Payer: Self-pay | Admitting: Physical Therapy

## 2015-03-03 ENCOUNTER — Ambulatory Visit (INDEPENDENT_AMBULATORY_CARE_PROVIDER_SITE_OTHER): Payer: Medicare Other | Admitting: Pharmacist

## 2015-03-03 DIAGNOSIS — Z5189 Encounter for other specified aftercare: Secondary | ICD-10-CM | POA: Diagnosis not present

## 2015-03-03 DIAGNOSIS — R2681 Unsteadiness on feet: Secondary | ICD-10-CM

## 2015-03-03 DIAGNOSIS — R531 Weakness: Secondary | ICD-10-CM

## 2015-03-03 DIAGNOSIS — I82403 Acute embolism and thrombosis of unspecified deep veins of lower extremity, bilateral: Secondary | ICD-10-CM

## 2015-03-03 DIAGNOSIS — R6889 Other general symptoms and signs: Secondary | ICD-10-CM

## 2015-03-03 DIAGNOSIS — Z86711 Personal history of pulmonary embolism: Secondary | ICD-10-CM

## 2015-03-03 DIAGNOSIS — R29818 Other symptoms and signs involving the nervous system: Secondary | ICD-10-CM | POA: Diagnosis not present

## 2015-03-03 DIAGNOSIS — R2689 Other abnormalities of gait and mobility: Secondary | ICD-10-CM

## 2015-03-03 DIAGNOSIS — R269 Unspecified abnormalities of gait and mobility: Secondary | ICD-10-CM

## 2015-03-03 DIAGNOSIS — Z4789 Encounter for other orthopedic aftercare: Secondary | ICD-10-CM

## 2015-03-03 DIAGNOSIS — Z89612 Acquired absence of left leg above knee: Secondary | ICD-10-CM

## 2015-03-03 NOTE — Progress Notes (Signed)
No charge for labs or visit - INR check through home monitoring system  

## 2015-03-03 NOTE — Telephone Encounter (Signed)
Called patient and discussed results from 03/01/15.  He is to no take warfarin today 03/03/2015.  While on phone patient removed both the 2mg  and 2.5mg  warfarin tablets form his box.  Decrease dose to 2.5mg  on Saturdays and 4.5mg  all other days.  Patient was notified and his pharmacy was notified so they can send new dose in his medications packages.

## 2015-03-03 NOTE — Therapy (Signed)
[facility] [address] [place], [place], [address] Phone: [phone]   Fax:  [phone]  Physical Therapy Treatment  Patient Details  Name: Scott [name] MRN: [id number] Date of Birth: 05-22-1921 Referring Provider: Redge Gainer, MD  Encounter Date: 03/03/2015      PT End of Session - 03/03/15 1157    Visit Number 19   Number of Visits 30   Date for PT Re-Evaluation 04/15/15   Authorization Type Medicare Do G-code every 10th visit   PT Start Time 0930   PT Stop Time 1015   PT Time Calculation (min) 45 min   Equipment Utilized During Treatment Gait belt   Activity Tolerance Patient tolerated treatment well   Behavior During Therapy [facility] for tasks assessed/performed      Past Medical History  Diagnosis Date  . HTN (hypertension)   . Hyperlipidemia   . PVCs (premature ventricular contractions)   . Glaucoma   . PVD (peripheral vascular disease) (HCC)     Right ABI .75, Left .78 (2006)  . Popliteal artery aneurysm, bilateral (HCC) DECUMENTED CHRONIC PARTIAL OCCLUSION--  PT DENIES CLAUDICATION OR ANY OTHER SYMPTOMS  . Frequency of urination   . Nocturia   . DVT, bilateral lower limbs ([place]) 03/2013    a. Bilat DVT dx 03/2013.  . Pulmonary embolism ([place])     a. By CT angio 04/2013.  Marland Kitchen Elevated troponin     a. Adm 12/30-04/2013 - not felt to represent ACS; ?due to PE. b. Patent cors 06/2012.  Marland Kitchen NSVT (nonsustained ventricular tachycardia) ([place])     a. NSVT 06/2012; NSVT also seen during 03/2013 adm. b. Med rx. Not candidate for ICD given adv age.  Marland Kitchen ETOH abuse   . Syncope     a. Felt to be postural syncope related to diuretics 06/2012.  Marland Kitchen Heart murmur   . DVT (deep venous thrombosis) ([place])     "I think in both legs"  . Pneumonia     "couple times"  . DDD (degenerative disc disease)   . Arthritis     "all over"  . Depression   . CAD- non obstructive disease by cath 3/14   . PAD (peripheral artery disease)  ([place])   . Gangrene of toe ([place]) 10/03/2014  . Sepsis due to other etiology ([place]) 10/03/2014  . Chronic systolic heart failure ([place]) 10/04/14    EF improved 50-55%  . Chronic systolic CHF (congestive heart failure) ([place])     a. NICM - patent cors 06/2012, EF 40% at that time. b. 03/2013 eval: EF 20-25%.  . Chronic lower back pain   . Bladder cancer ([place]) 03/15/2012    pt denies this hx on 10/15/2014    Past Surgical History  Procedure Laterality Date  . Transthoracic echocardiogram  09-22-2010    MODERATE LVH/ EF 37%/ GRADE I DIASTOLIC DYSFUNCTION/ AORTIC SCLEROSIS WITHOUT STENOSIS/  RV  SYSTOLIC  MILDLY REDUCED FUNCTION  . Cardiovascular stress test  10-15-2010    LOW RISK NUCLEAR STUDY/ NO EVIDENCE OF ISCHEMIA/ NORMAL EF  . Shoulder surgery  1970's    "don't remember which side or what kind of OR"  . Transurethral resection of bladder tumor  10/07/2011    Procedure: TRANSURETHRAL RESECTION OF BLADDER TUMOR (TURBT);  Surgeon: Malka So, MD;  Location: [facility];  Service: Urology;  Laterality: N/A;  . Cystoscopy  10/07/2011    Procedure: CYSTOSCOPY;  Surgeon: Malka So, MD;  Location: [facility];  Service: Urology;  Laterality: N/A;  . Cystoscopy with biopsy  03/14/2012    Procedure: CYSTOSCOPY WITH BIOPSY;  Surgeon: Malka So, MD;  [facility];  Service: Urology;  Laterality: N/A;  WITH FULGURATION  . Transurethral resection of prostate  03/14/2012    Procedure: TRANSURETHRAL RESECTION OF THE PROSTATE WITH GYRUS INSTRUMENTS;  Surgeon: Malka So, MD;  [facility];  Service: Urology;  Laterality: N/A;  . Tonsillectomy    . Cataract extraction w/ intraocular lens  implant, bilateral Bilateral   . Cardiac catheterization  05-11-2004  DR [place] PLAQUE/ NORMAL LVF/ EF 55%/  NON-OBSTRUCTIVE LAD 25%  . Cardiac catheterization  06/2012  . Eye surgery    . Amputation Left 10/16/2014    Procedure: AMPUTATION ABOVE KNEE- LEFT;   Surgeon: Mal Misty, MD;  Location: [facility];  Service: Vascular;  Laterality: Left;    There were no vitals filed for this visit.  Visit Diagnosis:  Abnormality of gait  Unsteadiness  Functional weakness  Status post above knee amputation of left lower extremity ([place])  Encounter for prosthetic gait training  Balance problems  Decreased functional activity tolerance      Subjective Assessment - 03/03/15 0936    Subjective No new complaints or falls to report.   Currently in Pain? No/denies     Prosthetic training: To improve pt's gait to reduce risk of falls and increase pt's functional independence. Pt required min guard to min assist during gait with RW due to intermittent instability.   1. 115 ft X4 with seated rest breaks in between trials. Pt required verbal cues to increase stance width on R, to center LE's in walker, upright posture, to step through on R, and to decrease inner and outer circumduction on Lt with swing phase.  2. Curb: Pt completed 3 trials of curb up and down. Pt required verbal cues for leading with R LE (non prosthesis side) on the ascent with 1st rep. Cues on stance position for stability.  3. Stairs: Pt negotiated 4 stairs for 1 rep with bil UE support on L hand rail and facing the L rail (side ways negotiation). Pt instructed to lead with R LE on the ascent and leading with L LE on the descent.  4. Ramp: Pt negotiated ramp for 3 reps up/ down with min guard assist and verbal cues for locking Lt prosthetic knee, shortened step length.         [facility] Adult PT Treatment/Exercise - 03/03/15 0001    Prosthetics   Current prosthetic wear tolerance (days/week)  daily   Current prosthetic wear tolerance (#hours/day)  wearing >90% of awake hours   Residual limb condition  intact   Donning Prosthesis Modified independent (device/increased time)   Doffing Prosthesis Modified independent (device/increased time)          PT Short Term Goals -  03/03/15 1204    PT SHORT TERM GOAL #1   Title Patient donnes prosthesis correctly modified independent. (Target Date: 01/15/2015)   Baseline MET 02/11/2015   Time 1   Period Months   Status Achieved   PT SHORT TERM GOAL #2   Title Patient tolerates wear >10hrs total per day without skin issues or limb pain.  (Target Date: 01/15/2015)   Baseline MET 01/13/2015 Patient reports wear 5hrs 2x/day with  skin issues or limb pain.    Time 1   Period Months   Status Achieved  PT SHORT TERM GOAL #3   Title Patient performs standing balance with RW & prosthesis reaching 10", to floor and manages pants for toileting modified inependent.  (Target Date: 01/15/2015)   Baseline Partially MET 01/13/2015 Patient performs standing balance with RW & prosthesis reaching 10", to floor and simulates managing pants with supervision.    Time 1   Period Months   Status Partially Met   PT SHORT TERM GOAL #4   Title Patient ambulates 150' with RW & prosthesis with SBA.  (NEW Target Date: 03/14/2015)   Time 1   Period Months   Status Revised   PT SHORT TERM GOAL #5   Title Patient negotiates stairs (2 rails), ramps & curbs with RW & prosthesis with supervision.  (NEW Target Date: 03/14/2015)   Time 1   Period Months   Status Revised           PT Long Term Goals - 03/03/15 1204    PT LONG TERM GOAL #1   Title Patient verbalizes proper prosthetic care including donning.   NEW Target Date 04/11/2015   Baseline Partially MET 02/11/2015  Patient donnes prosthesis modified independent. He requires cues for adjusting ply socks and addressing issues to call prosthetist.    Time 2   Period Months   Status Partially Met   PT LONG TERM GOAL #2   Title Patient tolerates wear of prosthesis >80% of awake hours without skin issues or limb pain.  (Target Date 02/14/2015)   Baseline MET 02/11/2015   Time 2   Period Months   Status Achieved   PT LONG TERM GOAL #3   Title Berg Balance >/= 20/56  (Target Date: 02/14/2015) NEW  Target Date 04/11/2015   Baseline 17/56 on 02/14/15   Time 2   Period Months   Status Revised   PT LONG TERM GOAL #4   Title Patient ambulates 250' with rolling walker & prosthesis with supervision to enable community mobility.  ( NEW Target Date 04/11/2015)   Time 2   Period Months   Status On-going   PT LONG TERM GOAL #5   Title Patient negotiates stairs, ramps & curbs with rolling walker & prosthesis with supervision to enable community mobility.  (NEW Target Date: 04/11/2015)   Time 2   Period Months   Status On-going   PT LONG TERM GOAL #6   Title Patient ambulates household around furniture with rolling walker & prosthesis modified independent.  (Target Date: 12/30//2016)   Baseline S level 02/14/15   Time 2   Period Months   Status On-going     Problem List Patient Active Problem List   Diagnosis Date Noted  . Status post above knee amputation of left lower extremity ([place]) 10/18/2014  . Tachyarrhythmia 10/05/2014  . Aspiration pneumonia ([place]) 10/05/2014  . UTI (lower urinary tract infection) 10/03/2014  . Sepsis due to other etiology ([place]) 10/03/2014  . Gangrene of toe ([place]) 10/03/2014  . Ischemic ulcer of left foot ([place]) 10/03/2014  . Atherosclerotic PVD with ulceration ([place]) 10/03/2014  . Warfarin-induced coagulopathy ([place]) 10/03/2014  . Atrial fibrillation with RVR ([place]) 10/03/2014  . Acute kidney injury ([place]) 10/03/2014  . Hyperkalemia 10/03/2014  . Cellulitis of left leg 10/03/2014  . PAD (peripheral artery disease) ([place]) 09/24/2014  . NSTEMI (non-ST elevated myocardial infarction) ([place]) 10/09/2013  . High risk medication use 05/17/2013  . BPH (benign prostatic hyperplasia) 05/17/2013  . Vitamin D deficiency 05/17/2013  . History of pulmonary embolism (2014) 04/17/2013  .  NSVT (nonsustained ventricular tachycardia)- not an ICD candidate 04/17/2013  . NICM (nonischemic cardiomyopathy)- EF 20-25% by Echo 04/12/13 04/17/2013  . Chronic diastolic heart failure, NYHA  class 1 ([place]) 03/15/2013  . DVT of lower extremity, bilateral ([place]) 03/15/2013  . Fatigue 11/02/2012  . Constipation 11/02/2012  . Alcohol abuse 08/27/2012  . Syncope 06/21/2012  . Bladder cancer ([place]) 03/15/2012  . Benign localized hyperplasia of prostate with urinary obstruction 03/15/2012  . Benign essential HTN   . Hyperlipidemia   . PVCs (premature ventricular contractions)   . CAD- non obstructive disease by cath 3/14     Laney Potash 03/03/2015, 12:25 PM  Laney Potash, [place]  Name: Scott [name] MRN: [id number] Date of Birth: [birth year]  This note has been reviewed and edited by supervising CI.  Willow Ora, PTA, [facility] [address], [place], [place] [address] [phone] 03/03/2015, 3:48 PM

## 2015-03-03 NOTE — Telephone Encounter (Signed)
Patients INR on 03/01/15 was 3.7

## 2015-03-05 ENCOUNTER — Ambulatory Visit: Payer: Medicare Other | Admitting: Physical Therapy

## 2015-03-05 ENCOUNTER — Encounter: Payer: Self-pay | Admitting: Physical Therapy

## 2015-03-05 DIAGNOSIS — Z89612 Acquired absence of left leg above knee: Secondary | ICD-10-CM

## 2015-03-05 DIAGNOSIS — R6889 Other general symptoms and signs: Secondary | ICD-10-CM

## 2015-03-05 DIAGNOSIS — I482 Chronic atrial fibrillation: Secondary | ICD-10-CM | POA: Diagnosis not present

## 2015-03-05 DIAGNOSIS — Z4789 Encounter for other orthopedic aftercare: Secondary | ICD-10-CM

## 2015-03-05 DIAGNOSIS — R2689 Other abnormalities of gait and mobility: Secondary | ICD-10-CM

## 2015-03-05 DIAGNOSIS — R531 Weakness: Secondary | ICD-10-CM

## 2015-03-05 DIAGNOSIS — R269 Unspecified abnormalities of gait and mobility: Secondary | ICD-10-CM

## 2015-03-05 DIAGNOSIS — Z7901 Long term (current) use of anticoagulants: Secondary | ICD-10-CM | POA: Diagnosis not present

## 2015-03-05 DIAGNOSIS — Z5189 Encounter for other specified aftercare: Secondary | ICD-10-CM | POA: Diagnosis not present

## 2015-03-05 DIAGNOSIS — R29818 Other symptoms and signs involving the nervous system: Secondary | ICD-10-CM | POA: Diagnosis not present

## 2015-03-05 DIAGNOSIS — R2681 Unsteadiness on feet: Secondary | ICD-10-CM

## 2015-03-05 NOTE — Therapy (Signed)
[facility] [address] [place], [place], [address] Phone: [phone]   Fax:  [phone]  Physical Therapy Treatment  Patient Details  Name: Scott [name] MRN: [id number] Date of Birth: 1921/10/28 Referring Provider: Redge Gainer, MD  Encounter Date: 03/05/2015      PT End of Session - 03/05/15 1242    Visit Number 20   Number of Visits 30   Date for PT Re-Evaluation 04/15/15   Authorization Type Medicare Do G-code every 10th visit   PT Start Time 0930   PT Stop Time 1015   PT Time Calculation (min) 45 min   Equipment Utilized During Treatment Gait belt   Activity Tolerance Patient tolerated treatment well   Behavior During Therapy [facility] for tasks assessed/performed      Past Medical History  Diagnosis Date  . HTN (hypertension)   . Hyperlipidemia   . PVCs (premature ventricular contractions)   . Glaucoma   . PVD (peripheral vascular disease) (HCC)     Right ABI .75, Left .78 (2006)  . Popliteal artery aneurysm, bilateral (HCC) DECUMENTED CHRONIC PARTIAL OCCLUSION--  PT DENIES CLAUDICATION OR ANY OTHER SYMPTOMS  . Frequency of urination   . Nocturia   . DVT, bilateral lower limbs ([place]) 03/2013    a. Bilat DVT dx 03/2013.  . Pulmonary embolism ([place])     a. By CT angio 04/2013.  Marland Kitchen Elevated troponin     a. Adm 12/30-04/2013 - not felt to represent ACS; ?due to PE. b. Patent cors 06/2012.  Marland Kitchen NSVT (nonsustained ventricular tachycardia) ([place])     a. NSVT 06/2012; NSVT also seen during 03/2013 adm. b. Med rx. Not candidate for ICD given adv age.  Marland Kitchen ETOH abuse   . Syncope     a. Felt to be postural syncope related to diuretics 06/2012.  Marland Kitchen Heart murmur   . DVT (deep venous thrombosis) ([place])     "I think in both legs"  . Pneumonia     "couple times"  . DDD (degenerative disc disease)   . Arthritis     "all over"  . Depression   . CAD- non obstructive disease by cath 3/14   . PAD (peripheral artery disease)  ([place])   . Gangrene of toe ([place]) 10/03/2014  . Sepsis due to other etiology ([place]) 10/03/2014  . Chronic systolic heart failure ([place]) 10/04/14    EF improved 50-55%  . Chronic systolic CHF (congestive heart failure) ([place])     a. NICM - patent cors 06/2012, EF 40% at that time. b. 03/2013 eval: EF 20-25%.  . Chronic lower back pain   . Bladder cancer ([place]) 03/15/2012    pt denies this hx on 10/15/2014    Past Surgical History  Procedure Laterality Date  . Transthoracic echocardiogram  09-22-2010    MODERATE LVH/ EF 37%/ GRADE I DIASTOLIC DYSFUNCTION/ AORTIC SCLEROSIS WITHOUT STENOSIS/  RV  SYSTOLIC  MILDLY REDUCED FUNCTION  . Cardiovascular stress test  10-15-2010    LOW RISK NUCLEAR STUDY/ NO EVIDENCE OF ISCHEMIA/ NORMAL EF  . Shoulder surgery  1970's    "don't remember which side or what kind of OR"  . Transurethral resection of bladder tumor  10/07/2011    Procedure: TRANSURETHRAL RESECTION OF BLADDER TUMOR (TURBT);  Surgeon: Malka So, MD;  Location: [facility];  Service: Urology;  Laterality: N/A;  . Cystoscopy  10/07/2011    Procedure: CYSTOSCOPY;  Surgeon: Malka So, MD;  Location: [facility];  Service: Urology;  Laterality: N/A;  . Cystoscopy with biopsy  03/14/2012    Procedure: CYSTOSCOPY WITH BIOPSY;  Surgeon: Malka So, MD;  Location: [facility];  Service: Urology;  Laterality: N/A;  WITH FULGURATION  . Transurethral resection of prostate  03/14/2012    Procedure: TRANSURETHRAL RESECTION OF THE PROSTATE WITH GYRUS INSTRUMENTS;  Surgeon: Malka So, MD;  Location: [facility];  Service: Urology;  Laterality: N/A;  . Tonsillectomy    . Cataract extraction w/ intraocular lens  implant, bilateral Bilateral   . Cardiac catheterization  05-11-2004  DR [place] PLAQUE/ NORMAL LVF/ EF 55%/  NON-OBSTRUCTIVE LAD 25%  . Cardiac catheterization  06/2012  . Eye surgery    . Amputation Left 10/16/2014    Procedure: AMPUTATION ABOVE KNEE- LEFT;   Surgeon: Mal Misty, MD;  Location: [facility];  Service: Vascular;  Laterality: Left;    There were no vitals filed for this visit.  Visit Diagnosis:  Abnormality of gait  Unsteadiness  Functional weakness  Status post above knee amputation of left lower extremity ([place])  Encounter for prosthetic gait training  Balance problems  Decreased functional activity tolerance      Subjective Assessment - 03/05/15 0934    Subjective Pt c/o pain in left hand. Pt does not know what is causing pain. Pt rated pain at 5/10. Pt caregiver reports that pt uses walker at without assistance.   Currently in Pain? Yes   Pain Score 5    Pain Location Hand   Pain Orientation Left   Pain Descriptors / Indicators Aching   Pain Type Acute pain   Pain Onset Today   Multiple Pain Sites No      Prosthetic Training: To promote safe ambulation in home and out in community.  1. Pt ambulated 230 ft X1 and 60 ft X1with rollator. Pt min guard assist with verbal cues for looking forward, to center self in rollator, to increase step length R and posture. Pt min guard assist with LOB X1 due to change in ambulation direction. Pt mod assist for LOB X1 due to prosthetic knee buckle with rollator too far forward.  2. Pt ambulated 115 ft X1 with RW. Pt required min guard assist with verbal cues for eyes forward and to keep walker moving forward during gait.  3. Curb: Pt negotiated curb up and down X2 with rollator after demonstration by SPTA. Pt min assist with verbal cues for proper technique and staggered stance before rollator advancement.  4. Ramp: Pt negotiated ramp up and down X2 with rollator after demonstration by SPTA. Pt min assist with verbal cues for proper technique and staggered stance before rollator advancement.  5. Stairs: Pt negotiated stairs while facing and using only 1 hand rail on L X1. Pt supervision with verbal cues for foot placement under hips to increase balance.       [facility] Adult PT  Treatment/Exercise - 03/05/15 0001    Prosthetics   Current prosthetic wear tolerance (days/week)  daily   Current prosthetic wear tolerance (#hours/day)  wearing >90% of awake hours   Residual limb condition  intact   Donning Prosthesis Modified independent (device/increased time)   Doffing Prosthesis Modified independent (device/increased time)             PT Short Term Goals - 03/05/15 1247    PT SHORT TERM GOAL #1   Title Patient donnes prosthesis correctly modified independent. (Target Date: 01/15/2015)  Baseline MET 02/11/2015   Time 1   Period Months   Status Achieved   PT SHORT TERM GOAL #2   Title Patient tolerates wear >10hrs total per day without skin issues or limb pain.  (Target Date: 01/15/2015)   Baseline MET 01/13/2015 Patient reports wear 5hrs 2x/day with  skin issues or limb pain.    Time 1   Period Months   Status Achieved   PT SHORT TERM GOAL #3   Title Patient performs standing balance with RW & prosthesis reaching 10", to floor and manages pants for toileting modified inependent.  (Target Date: 01/15/2015)   Baseline Partially MET 01/13/2015 Patient performs standing balance with RW & prosthesis reaching 10", to floor and simulates managing pants with supervision.    Time 1   Period Months   Status Partially Met   PT SHORT TERM GOAL #4   Title Patient ambulates 150' with RW & prosthesis with SBA.  (NEW Target Date: 03/14/2015)   Time 1   Period Months   Status Revised   PT SHORT TERM GOAL #5   Title Patient negotiates stairs (2 rails), ramps & curbs with RW & prosthesis with supervision.  (NEW Target Date: 03/14/2015)   Time 1   Period Months   Status Revised           PT Long Term Goals - 03/05/15 1247    PT LONG TERM GOAL #1   Title Patient verbalizes proper prosthetic care including donning.   NEW Target Date 04/11/2015   Baseline Partially MET 02/11/2015  Patient donnes prosthesis modified independent. He requires cues for adjusting ply socks and  addressing issues to call prosthetist.    Time 2   Period Months   Status Partially Met   PT LONG TERM GOAL #2   Title Patient tolerates wear of prosthesis >80% of awake hours without skin issues or limb pain.  (Target Date 02/14/2015)   Baseline MET 02/11/2015   Time 2   Period Months   Status Achieved   PT LONG TERM GOAL #3   Title Berg Balance >/= 20/56  (Target Date: 02/14/2015) NEW Target Date 04/11/2015   Baseline 17/56 on 02/14/15   Time 2   Period Months   Status Revised   PT LONG TERM GOAL #4   Title Patient ambulates 250' with rolling walker & prosthesis with supervision to enable community mobility.  ( NEW Target Date 04/11/2015)   Time 2   Period Months   Status On-going   PT LONG TERM GOAL #5   Title Patient negotiates stairs, ramps & curbs with rolling walker & prosthesis with supervision to enable community mobility.  (NEW Target Date: 04/11/2015)   Time 2   Period Months   Status On-going   PT LONG TERM GOAL #6   Title Patient ambulates household around furniture with rolling walker & prosthesis modified independent.  (Target Date: 12/30//2016)   Baseline S level 02/14/15   Time 2   Period Months   Status On-going               Plan - 03/05/15 1242    Clinical Impression Statement Pt's gait presents improved with use of RW. Pt presents less stable and with increased deviations on rollator. Pt ambulated 230 ft without rest break. Pt with left knee buckle with gait with rollator. The knee buckle appeared to be due to fatigue.Marland Kitchen   Pt will benefit from skilled therapeutic intervention in order to improve on the following  deficits Abnormal gait;Decreased activity tolerance;Decreased balance;Decreased endurance;Decreased knowledge of use of DME;Decreased mobility;Decreased range of motion;Decreased skin integrity;Decreased strength;Postural dysfunction;Prosthetic Dependency   Rehab Potential Good   PT Frequency 2x / week   PT Duration Other (comment)  9 weeks (60  days)   PT Treatment/Interventions ADLs/Self Care Home Management;DME Instruction;Gait training;Stair training;Functional mobility training;Therapeutic activities;Therapeutic exercise;Balance training;Neuromuscular re-education;Patient/family education;Prosthetic Training;Manual techniques   PT Next Visit Plan Continue to gait train with rollator. Try instructing pt to use breaks when stepping to increase stability and decrease risk of falls.   Consulted and Agree with Plan of Care Patient   Family Member Consulted aid          G-Codes - 04-02-15 1247    Functional Assessment Tool Used Pt mod I with donning prosthesis. Skin WNL. Pt wearing prosthesis for all awake hours. Each of these statements backed up by care giver report.   Functional Limitation Self care      Problem List Patient Active Problem List   Diagnosis Date Noted  . Status post above knee amputation of left lower extremity ([place]) 10/18/2014  . Tachyarrhythmia 10/05/2014  . Aspiration pneumonia ([place]) 10/05/2014  . UTI (lower urinary tract infection) 10/03/2014  . Sepsis due to other etiology ([place]) 10/03/2014  . Gangrene of toe ([place]) 10/03/2014  . Ischemic ulcer of left foot ([place]) 10/03/2014  . Atherosclerotic PVD with ulceration ([place]) 10/03/2014  . Warfarin-induced coagulopathy ([place]) 10/03/2014  . Atrial fibrillation with RVR ([place]) 10/03/2014  . Acute kidney injury ([place]) 10/03/2014  . Hyperkalemia 10/03/2014  . Cellulitis of left leg 10/03/2014  . PAD (peripheral artery disease) ([place]) 09/24/2014  . NSTEMI (non-ST elevated myocardial infarction) ([place]) 10/09/2013  . High risk medication use 05/17/2013  . BPH (benign prostatic hyperplasia) 05/17/2013  . Vitamin D deficiency 05/17/2013  . History of pulmonary embolism (2014) 04/17/2013  . NSVT (nonsustained ventricular tachycardia)- not an ICD candidate 04/17/2013  . NICM (nonischemic cardiomyopathy)- EF 20-25% by Echo 04/12/13 04/17/2013  . Chronic diastolic heart  failure, NYHA class 1 ([place]) 03/15/2013  . DVT of lower extremity, bilateral ([place]) 03/15/2013  . Fatigue 11/02/2012  . Constipation 11/02/2012  . Alcohol abuse 08/27/2012  . Syncope 06/21/2012  . Bladder cancer ([place]) 03/15/2012  . Benign localized hyperplasia of prostate with urinary obstruction 03/15/2012  . Benign essential HTN   . Hyperlipidemia   . PVCs (premature ventricular contractions)   . CAD- non obstructive disease by cath 3/14     Laney Potash Apr 02, 2015, 1:13 PM  [place], [place]  Name: Scott [name] MRN: [id number] Date of Birth: 03/04/1922  This note has been reviewed and edited by supervising CI.  Willow Ora, PTA, [facility] [address] [place], [place] [address] [phone] 2015-04-02, 4:34 PM

## 2015-03-05 NOTE — Therapy (Signed)
[facility] [address] [place], [place], [address] Phone: [phone]   Fax:  [phone]  Patient Details  Name: Scott [name] MRN: [id number] Date of Birth: May 17, 1921 Referring Provider: Redge Gainer, MD Encounter Date: 03/05/2015   Physical Therapy Progress Note  Dates of Reporting Period: 01/27/2015 to 03/05/2015  Objective Reports of Subjective Statement: Patient reports wearing prosthesis all awake hours with no issues with skin integrity. Patient requires some cues for adjusting ply socks and when to contact prosthetist.   Objective Measurements: Patient ambulates with standard rolling walker & prosthesis with supervision for household mobility and rollator walker for basic community mobility with minimal assist.   Goal Update:      PT Short Term Goals - 03/05/15 1247    PT SHORT TERM GOAL #1   Title Patient donnes prosthesis correctly modified independent. (Target Date: 01/15/2015)   Baseline MET 02/11/2015   Time 1   Period Months   Status Achieved   PT SHORT TERM GOAL #2   Title Patient tolerates wear >10hrs total per day without skin issues or limb pain.  (Target Date: 01/15/2015)   Baseline MET 01/13/2015 Patient reports wear 5hrs 2x/day with  skin issues or limb pain.    Time 1   Period Months   Status Achieved   PT SHORT TERM GOAL #3   Title Patient performs standing balance with RW & prosthesis reaching 10", to floor and manages pants for toileting modified inependent.  (Target Date: 01/15/2015)   Baseline Partially MET 01/13/2015 Patient performs standing balance with RW & prosthesis reaching 10", to floor and simulates managing pants with supervision.    Time 1   Period Months   Status Partially Met   PT SHORT TERM GOAL #4   Title Patient ambulates 150' with RW & prosthesis with SBA.  (NEW Target Date: 03/14/2015)   Time 1   Period Months   Status Revised   PT SHORT TERM GOAL #5   Title Patient  negotiates stairs (2 rails), ramps & curbs with RW & prosthesis with supervision.  (NEW Target Date: 03/14/2015)   Time 1   Period Months   Status Revised      Plan: Continue treatment plan towards LTGs.  Reason Skilled Services are Required: Patient and caregivers require skilled instruction to progress mobility with prosthesis at basic community level.    Jamey Reas PT, DPT 03/05/2015, 6:05 PM  [facility] [address] [place], [place], [address] Phone: [phone]   Fax:  [phone]

## 2015-03-10 ENCOUNTER — Ambulatory Visit: Payer: Medicare Other | Admitting: Physical Therapy

## 2015-03-10 ENCOUNTER — Encounter: Payer: Self-pay | Admitting: Physical Therapy

## 2015-03-10 DIAGNOSIS — Z89612 Acquired absence of left leg above knee: Secondary | ICD-10-CM

## 2015-03-10 DIAGNOSIS — R2681 Unsteadiness on feet: Secondary | ICD-10-CM

## 2015-03-10 DIAGNOSIS — R6889 Other general symptoms and signs: Secondary | ICD-10-CM | POA: Diagnosis not present

## 2015-03-10 DIAGNOSIS — Z5189 Encounter for other specified aftercare: Secondary | ICD-10-CM | POA: Diagnosis not present

## 2015-03-10 DIAGNOSIS — Z4789 Encounter for other orthopedic aftercare: Secondary | ICD-10-CM

## 2015-03-10 DIAGNOSIS — R269 Unspecified abnormalities of gait and mobility: Secondary | ICD-10-CM | POA: Diagnosis not present

## 2015-03-10 DIAGNOSIS — R29818 Other symptoms and signs involving the nervous system: Secondary | ICD-10-CM | POA: Diagnosis not present

## 2015-03-10 DIAGNOSIS — R531 Weakness: Secondary | ICD-10-CM | POA: Diagnosis not present

## 2015-03-10 NOTE — Therapy (Signed)
[facility] [address] [place], [place], [address] Phone: [phone]   Fax:  [phone]  Physical Therapy Treatment  Patient Details  Name: Scott [name] MRN: [id number] Date of Birth: Jul 30, 1921 Referring Provider: Redge Gainer, MD  Encounter Date: 03/10/2015      PT End of Session - 03/10/15 1015    Visit Number 21   Number of Visits 30   Date for PT Re-Evaluation 04/15/15   Authorization Type Medicare Do G-code every 10th visit   PT Start Time 0932   PT Stop Time 1015   PT Time Calculation (min) 43 min   Equipment Utilized During Treatment Gait belt   Activity Tolerance Patient tolerated treatment well   Behavior During Therapy [facility] for tasks assessed/performed      Past Medical History  Diagnosis Date  . HTN (hypertension)   . Hyperlipidemia   . PVCs (premature ventricular contractions)   . Glaucoma   . PVD (peripheral vascular disease) (HCC)     Right ABI .75, Left .78 (2006)  . Popliteal artery aneurysm, bilateral (HCC) DECUMENTED CHRONIC PARTIAL OCCLUSION--  PT DENIES CLAUDICATION OR ANY OTHER SYMPTOMS  . Frequency of urination   . Nocturia   . DVT, bilateral lower limbs ([place]) 03/2013    a. Bilat DVT dx 03/2013.  . Pulmonary embolism ([place])     a. By CT angio 04/2013.  Marland Kitchen Elevated troponin     a. Adm 12/30-04/2013 - not felt to represent ACS; ?due to PE. b. Patent cors 06/2012.  Marland Kitchen NSVT (nonsustained ventricular tachycardia) ([place])     a. NSVT 06/2012; NSVT also seen during 03/2013 adm. b. Med rx. Not candidate for ICD given adv age.  Marland Kitchen ETOH abuse   . Syncope     a. Felt to be postural syncope related to diuretics 06/2012.  Marland Kitchen Heart murmur   . DVT (deep venous thrombosis) ([place])     "I think in both legs"  . Pneumonia     "couple times"  . DDD (degenerative disc disease)   . Arthritis     "all over"  . Depression   . CAD- non obstructive disease by cath 3/14   . PAD (peripheral artery disease)  ([place])   . Gangrene of toe ([place]) 10/03/2014  . Sepsis due to other etiology ([place]) 10/03/2014  . Chronic systolic heart failure ([place]) 10/04/14    EF improved 50-55%  . Chronic systolic CHF (congestive heart failure) ([place])     a. NICM - patent cors 06/2012, EF 40% at that time. b. 03/2013 eval: EF 20-25%.  . Chronic lower back pain   . Bladder cancer ([place]) 03/15/2012    pt denies this hx on 10/15/2014    Past Surgical History  Procedure Laterality Date  . Transthoracic echocardiogram  09-22-2010    MODERATE LVH/ EF 03%/ GRADE I DIASTOLIC DYSFUNCTION/ AORTIC SCLEROSIS WITHOUT STENOSIS/  RV  SYSTOLIC  MILDLY REDUCED FUNCTION  . Cardiovascular stress test  10-15-2010    LOW RISK NUCLEAR STUDY/ NO EVIDENCE OF ISCHEMIA/ NORMAL EF  . Shoulder surgery  1970's    "don't remember which side or what kind of OR"  . Transurethral resection of bladder tumor  10/07/2011    Procedure: TRANSURETHRAL RESECTION OF BLADDER TUMOR (TURBT);  Surgeon: Malka So, MD;  Location: [facility];  Service: Urology;  Laterality: N/A;  . Cystoscopy  10/07/2011    Procedure: CYSTOSCOPY;  Surgeon: Malka So, MD;  Location: [facility];  Service: Urology;  Laterality: N/A;  . Cystoscopy with biopsy  03/14/2012    Procedure: CYSTOSCOPY WITH BIOPSY;  Surgeon: Malka So, MD;  Location: [facility];  Service: Urology;  Laterality: N/A;  WITH FULGURATION  . Transurethral resection of prostate  03/14/2012    Procedure: TRANSURETHRAL RESECTION OF THE PROSTATE WITH GYRUS INSTRUMENTS;  Surgeon: Malka So, MD;  Location: [facility];  Service: Urology;  Laterality: N/A;  . Tonsillectomy    . Cataract extraction w/ intraocular lens  implant, bilateral Bilateral   . Cardiac catheterization  05-11-2004  DR [place] PLAQUE/ NORMAL LVF/ EF 55%/  NON-OBSTRUCTIVE LAD 25%  . Cardiac catheterization  06/2012  . Eye surgery    . Amputation Left 10/16/2014    Procedure: AMPUTATION ABOVE KNEE- LEFT;   Surgeon: Mal Misty, MD;  Location: [facility];  Service: Vascular;  Laterality: Left;    There were no vitals filed for this visit.  Visit Diagnosis:  Abnormality of gait  Unsteadiness  Functional weakness  Status post above knee amputation of left lower extremity ([place])  Encounter for prosthetic gait training  Decreased functional activity tolerance      Subjective Assessment - 03/10/15 0933    Subjective (p) He is walking some in house with aides.    Currently in Pain? (p) No/denies                         [facility] Adult PT Treatment/Exercise - 03/10/15 0930    Transfers   Sit to Stand 6: Modified independent (Device/Increase time);With upper extremity assist;With armrests;From chair/3-in-1   Sit to Stand Details (indicate cue type and reason) PT instructed pt, CNA & son in sitting on various pieces of household furniture to  determine which ones he can independently arise and which ones only sit if assistance available to work on it; verbalized understanding   Stand to Sit 6: Modified independent (Device/Increase time);With upper extremity assist;With armrests;To chair/3-in-1   Ambulation/Gait   Ambulation/Gait Yes   Ambulation/Gait Assistance 5: Supervision   Ambulation/Gait Assistance Details PT instructed pt, aide & son in safety with RW with fixed front wheels compared to rollator walker. Pt verbalized understanding including looking into seat options on standard RW.    Ambulation Distance (Feet) 302 Feet  170' around furniture and 302' for community-like mobility   Assistive device Prosthesis;Rolling walker   Gait Pattern Step-to pattern;Decreased step length - right;Decreased stance time - left;Decreased stride length;Decreased hip/knee flexion - left;Decreased weight shift to left;Left hip hike;Left circumduction;Trunk flexed;Abducted - left;Poor foot clearance - left   Ambulation Surface Indoor;Level   Ramp 5: Supervision  RW & prosthesis    Ramp  Details (indicate cue type and reason) verbal cues on hand position & wt shift   Curb 5: Supervision  RW & prosthesis   Curb Details (indicate cue type and reason) verbal cues on sequence   Prosthetics   Current prosthetic wear tolerance (days/week)  daily   Current prosthetic wear tolerance (#hours/day)  wearing >90% of awake hours   Residual limb condition  intact                PT Education - 03/10/15 1015    Education provided Yes   Education Details Standard RW vs rollator walker and only advantage for rollator is seat to rest; ambulating in home in "ear shot" of aide or family to build independence; increasing activity level  by not sitting in w/c in home, ambulating throughout home with increased frequency and using RW for community with family or aide.    Person(s) Educated Patient;Child(ren);Caregiver(s)   Methods Explanation   Comprehension Verbalized understanding;Need further instruction          PT Short Term Goals - 03/10/15 1015    PT SHORT TERM GOAL #1   Title Patient donnes prosthesis correctly modified independent. (Target Date: 01/15/2015)   Baseline MET 02/11/2015   Time 1   Period Months   Status Achieved   PT SHORT TERM GOAL #2   Title Patient tolerates wear >10hrs total per day without skin issues or limb pain.  (Target Date: 01/15/2015)   Baseline MET 01/13/2015 Patient reports wear 5hrs 2x/day with  skin issues or limb pain.    Time 1   Period Months   Status Achieved   PT SHORT TERM GOAL #3   Title Patient performs standing balance with RW & prosthesis reaching 10", to floor and manages pants for toileting modified inependent.  (Target Date: 01/15/2015)   Baseline Partially MET 01/13/2015 Patient performs standing balance with RW & prosthesis reaching 10", to floor and simulates managing pants with supervision.    Time 1   Period Months   Status Partially Met   PT SHORT TERM GOAL #4   Title Patient ambulates 150' with RW & prosthesis with SBA.   (NEW Target Date: 03/14/2015)   Baseline MET 03/10/2015   Time 1   Period Months   Status Achieved   PT SHORT TERM GOAL #5   Title Patient negotiates stairs (2 rails), ramps & curbs with RW & prosthesis with supervision.  (NEW Target Date: 03/14/2015)   Baseline MET 03/10/2015   Time 1   Period Months   Status Achieved           PT Long Term Goals - 03/10/15 1015    PT LONG TERM GOAL #1   Title Patient verbalizes proper prosthetic care including donning.   NEW Target Date 04/11/2015   Baseline Partially MET 02/11/2015  Patient donnes prosthesis modified independent. He requires cues for adjusting ply socks and addressing issues to call prosthetist.    Time 2   Period Months   Status On-going   PT LONG TERM GOAL #2   Title Patient tolerates wear of prosthesis >80% of awake hours without skin issues or limb pain.  (Target Date 02/14/2015)   Baseline MET 02/11/2015   Time 2   Period Months   Status Achieved   PT LONG TERM GOAL #3   Title Berg Balance >/= 20/56  (Target Date: 02/14/2015) NEW Target Date 04/11/2015   Baseline 17/56 on 02/14/15   Time 2   Period Months   Status On-going   PT LONG TERM GOAL #4   Title Patient ambulates 250' with rolling walker & prosthesis with supervision to enable community mobility.  ( NEW Target Date 04/11/2015)   Time 2   Period Months   Status On-going   PT LONG TERM GOAL #5   Title Patient negotiates stairs, ramps & curbs with rolling walker & prosthesis with supervision to enable community mobility.  (NEW Target Date: 04/11/2015)   Time 2   Period Months   Status On-going   PT LONG TERM GOAL #6   Title Patient ambulates household around furniture with rolling walker & prosthesis modified independent.  (Target Date: 12/30//2016)   Baseline S level 02/14/15   Time 2   Period Months  Status On-going               Plan - 03/10/15 1015    Clinical Impression Statement Patient met all STGs. He appears to need to use a standard RW  with fixed wheels instead of rollator walker with swivel front wheels for safety. He may be able to find a standard RW with a seat at local equipment company that carries more options for devices.    Pt will benefit from skilled therapeutic intervention in order to improve on the following deficits Abnormal gait;Decreased activity tolerance;Decreased balance;Decreased endurance;Decreased knowledge of use of DME;Decreased mobility;Decreased range of motion;Decreased skin integrity;Decreased strength;Postural dysfunction;Prosthetic Dependency   Rehab Potential Good   PT Frequency 2x / week   PT Duration Other (comment)  9 weeks (60 days)   PT Treatment/Interventions ADLs/Self Care Home Management;DME Instruction;Gait training;Stair training;Functional mobility training;Therapeutic activities;Therapeutic exercise;Balance training;Neuromuscular re-education;Patient/family education;Prosthetic Training;Manual techniques   PT Next Visit Plan Continue towards LTGs.    Consulted and Agree with Plan of Care Patient;Family member/caregiver   Family Member Consulted CNA, son        Problem List Patient Active Problem List   Diagnosis Date Noted  . Status post above knee amputation of left lower extremity ([place]) 10/18/2014  . Tachyarrhythmia 10/05/2014  . Aspiration pneumonia ([place]) 10/05/2014  . UTI (lower urinary tract infection) 10/03/2014  . Sepsis due to other etiology ([place]) 10/03/2014  . Gangrene of toe ([place]) 10/03/2014  . Ischemic ulcer of left foot ([place]) 10/03/2014  . Atherosclerotic PVD with ulceration ([place]) 10/03/2014  . Warfarin-induced coagulopathy ([place]) 10/03/2014  . Atrial fibrillation with RVR ([place]) 10/03/2014  . Acute kidney injury ([place]) 10/03/2014  . Hyperkalemia 10/03/2014  . Cellulitis of left leg 10/03/2014  . PAD (peripheral artery disease) ([place]) 09/24/2014  . NSTEMI (non-ST elevated myocardial infarction) ([place]) 10/09/2013  . High risk medication use 05/17/2013  . BPH (benign  prostatic hyperplasia) 05/17/2013  . Vitamin D deficiency 05/17/2013  . History of pulmonary embolism (2014) 04/17/2013  . NSVT (nonsustained ventricular tachycardia)- not an ICD candidate 04/17/2013  . NICM (nonischemic cardiomyopathy)- EF 20-25% by Echo 04/12/13 04/17/2013  . Chronic diastolic heart failure, NYHA class 1 ([place]) 03/15/2013  . DVT of lower extremity, bilateral ([place]) 03/15/2013  . Fatigue 11/02/2012  . Constipation 11/02/2012  . Alcohol abuse 08/27/2012  . Syncope 06/21/2012  . Bladder cancer ([place]) 03/15/2012  . Benign localized hyperplasia of prostate with urinary obstruction 03/15/2012  . Benign essential HTN   . Hyperlipidemia   . PVCs (premature ventricular contractions)   . CAD- non obstructive disease by cath 3/14     Koua Deeg PT, DPT 03/10/2015, 10:25 PM  [facility] [address] [place], [place], [address] Phone: [phone]   Fax:  [phone]  Name: Scott [name] MRN: [id number] Date of Birth: 07/01/1921

## 2015-03-13 ENCOUNTER — Encounter: Payer: Self-pay | Admitting: Physical Therapy

## 2015-03-13 ENCOUNTER — Ambulatory Visit: Payer: Medicare Other | Attending: Family Medicine | Admitting: Physical Therapy

## 2015-03-13 DIAGNOSIS — R2681 Unsteadiness on feet: Secondary | ICD-10-CM | POA: Insufficient documentation

## 2015-03-13 DIAGNOSIS — R6889 Other general symptoms and signs: Secondary | ICD-10-CM | POA: Diagnosis not present

## 2015-03-13 DIAGNOSIS — Z4789 Encounter for other orthopedic aftercare: Secondary | ICD-10-CM

## 2015-03-13 DIAGNOSIS — R269 Unspecified abnormalities of gait and mobility: Secondary | ICD-10-CM | POA: Diagnosis not present

## 2015-03-13 DIAGNOSIS — R531 Weakness: Secondary | ICD-10-CM | POA: Insufficient documentation

## 2015-03-13 DIAGNOSIS — Z5189 Encounter for other specified aftercare: Secondary | ICD-10-CM | POA: Diagnosis not present

## 2015-03-13 DIAGNOSIS — R29818 Other symptoms and signs involving the nervous system: Secondary | ICD-10-CM | POA: Diagnosis not present

## 2015-03-13 DIAGNOSIS — Z89612 Acquired absence of left leg above knee: Secondary | ICD-10-CM | POA: Diagnosis not present

## 2015-03-13 NOTE — Therapy (Signed)
[facility] [address] [place], [place], [address] Phone: [phone]   Fax:  [phone]  Physical Therapy Treatment  Patient Details  Name: Scott [name] MRN: [id number] Date of Birth: [birth year] Referring Provider: Redge Gainer, MD  Encounter Date: 03/13/2015      PT End of Session - 03/13/15 1100    Visit Number 22   Number of Visits 30   Date for PT Re-Evaluation 04/15/15   Authorization Type Medicare Do G-code every 10th visit   PT Start Time 1018   PT Stop Time 1100   PT Time Calculation (min) 42 min   Equipment Utilized During Treatment Gait belt   Activity Tolerance Patient tolerated treatment well   Behavior During Therapy [facility] for tasks assessed/performed      Past Medical History  Diagnosis Date  . HTN (hypertension)   . Hyperlipidemia   . PVCs (premature ventricular contractions)   . Glaucoma   . PVD (peripheral vascular disease) (HCC)     Right ABI .75, Left .78 (2006)  . Popliteal artery aneurysm, bilateral (HCC) DECUMENTED CHRONIC PARTIAL OCCLUSION--  PT DENIES CLAUDICATION OR ANY OTHER SYMPTOMS  . Frequency of urination   . Nocturia   . DVT, bilateral lower limbs ([place]) 03/2013    a. Bilat DVT dx 03/2013.  . Pulmonary embolism ([place])     a. By CT angio 04/2013.  Marland Kitchen Elevated troponin     a. Adm 12/30-04/2013 - not felt to represent ACS; ?due to PE. b. Patent cors 06/2012.  Marland Kitchen NSVT (nonsustained ventricular tachycardia) ([place])     a. NSVT 06/2012; NSVT also seen during 03/2013 adm. b. Med rx. Not candidate for ICD given adv age.  Marland Kitchen ETOH abuse   . Syncope     a. Felt to be postural syncope related to diuretics 06/2012.  Marland Kitchen Heart murmur   . DVT (deep venous thrombosis) ([place])     "I think in both legs"  . Pneumonia     "couple times"  . DDD (degenerative disc disease)   . Arthritis     "all over"  . Depression   . CAD- non obstructive disease by cath 3/14   . PAD (peripheral artery disease)  ([place])   . Gangrene of toe ([place]) 10/03/2014  . Sepsis due to other etiology ([place]) 10/03/2014  . Chronic systolic heart failure ([place]) 10/04/14    EF improved 50-55%  . Chronic systolic CHF (congestive heart failure) ([place])     a. NICM - patent cors 06/2012, EF 40% at that time. b. 03/2013 eval: EF 20-25%.  . Chronic lower back pain   . Bladder cancer ([place]) 03/15/2012    pt denies this hx on 10/15/2014    Past Surgical History  Procedure Laterality Date  . Transthoracic echocardiogram  09-22-2010    MODERATE LVH/ EF 13%/ GRADE I DIASTOLIC DYSFUNCTION/ AORTIC SCLEROSIS WITHOUT STENOSIS/  RV  SYSTOLIC  MILDLY REDUCED FUNCTION  . Cardiovascular stress test  10-15-2010    LOW RISK NUCLEAR STUDY/ NO EVIDENCE OF ISCHEMIA/ NORMAL EF  . Shoulder surgery  1970's    "don't remember which side or what kind of OR"  . Transurethral resection of bladder tumor  10/07/2011    Procedure: TRANSURETHRAL RESECTION OF BLADDER TUMOR (TURBT);  Surgeon: Malka So, MD;  Location: [facility];  Service: Urology;  Laterality: N/A;  . Cystoscopy  10/07/2011    Procedure: CYSTOSCOPY;  Surgeon: Malka So, MD;  Location: [facility];  Service: Urology;  Laterality: N/A;  . Cystoscopy with biopsy  03/14/2012    Procedure: CYSTOSCOPY WITH BIOPSY;  Surgeon: John J Wrenn, MD;  Location: [facility];  Service: Urology;  Laterality: N/A;  WITH FULGURATION  . Transurethral resection of prostate  03/14/2012    Procedure: TRANSURETHRAL RESECTION OF THE PROSTATE WITH GYRUS INSTRUMENTS;  Surgeon: John J Wrenn, MD;  Location: [facility];  Service: Urology;  Laterality: N/A;  . Tonsillectomy    . Cataract extraction w/ intraocular lens  implant, bilateral Bilateral   . Cardiac catheterization  05-11-2004  DR HOCHREIN    MINIMAL CORANARY PLAQUE/ NORMAL LVF/ EF 55%/  NON-OBSTRUCTIVE LAD 25%  . Cardiac catheterization  06/2012  . Eye surgery    . Amputation Left 10/16/2014    Procedure: AMPUTATION ABOVE KNEE- LEFT;   Surgeon: Cher D Lawson, MD;  Location: [facility];  Service: Vascular;  Laterality: Left;    There were no vitals filed for this visit.  Visit Diagnosis:  Abnormality of gait  Unsteadiness  Functional weakness  Status post above knee amputation of left lower extremity (HCC)  Encounter for prosthetic gait training      Subjective Assessment - 03/13/15 1029    Subjective He is walking from one room to another at his house with RW without issues.    Patient is accompained by: --  CNA   Currently in Pain? No/denies                         [facility] Adult PT Treatment/Exercise - 03/13/15 1015    Transfers   Sit to Stand 6: Modified independent (Device/Increase time);With upper extremity assist;With armrests;From chair/3-in-1   Stand to Sit 6: Modified independent (Device/Increase time);With upper extremity assist;With armrests;To chair/3-in-1   Ambulation/Gait   Ambulation/Gait Yes   Ambulation/Gait Assistance 5: Supervision   Ambulation Distance (Feet) 250 Feet  150', 250' & 125'   Assistive device Prosthesis;Rolling walker   Gait Pattern Step-to pattern;Decreased step length - right;Decreased stance time - left;Decreased stride length;Decreased hip/knee flexion - left;Decreased weight shift to left;Left hip hike;Left circumduction;Trunk flexed;Abducted - left;Poor foot clearance - left   Ambulation Surface Indoor;Level   Ramp 5: Supervision  RW & prosthesis    Ramp Details (indicate cue type and reason) verbal cues minimal for wt shift   Curb 5: Supervision  RW & prosthesis   Curb Details (indicate cue type and reason) PT questioned sequence & pt able to verbalize prior to performing.    Prosthetics   Current prosthetic wear tolerance (days/week)  daily   Current prosthetic wear tolerance (#hours/day)  wearing >90% of awake hours   Residual limb condition  intact                PT Education - 03/13/15 1100    Education provided Yes   Education Details  enter & exit his home with RW with aid supervising. If he plans to purchase a RW with seat, then he should purchase it by next week to enable PT to assess safety   Person(s) Educated Patient;Caregiver(s)   Methods Explanation   Comprehension Verbalized understanding          PT Short Term Goals - 03/10/15 1015    PT SHORT TERM GOAL #1   Title Patient donnes prosthesis correctly modified independent. (Target Date: 01/15/2015)   Baseline MET 02/11/2015   Time 1   Period Months   Status Achieved     PT SHORT TERM GOAL #2   Title Patient tolerates wear >10hrs total per day without skin issues or limb pain.  (Target Date: 01/15/2015)   Baseline MET 01/13/2015 Patient reports wear 5hrs 2x/day with  skin issues or limb pain.    Time 1   Period Months   Status Achieved   PT SHORT TERM GOAL #3   Title Patient performs standing balance with RW & prosthesis reaching 10", to floor and manages pants for toileting modified inependent.  (Target Date: 01/15/2015)   Baseline Partially MET 01/13/2015 Patient performs standing balance with RW & prosthesis reaching 10", to floor and simulates managing pants with supervision.    Time 1   Period Months   Status Partially Met   PT SHORT TERM GOAL #4   Title Patient ambulates 150' with RW & prosthesis with SBA.  (NEW Target Date: 03/14/2015)   Baseline MET 03/10/2015   Time 1   Period Months   Status Achieved   PT SHORT TERM GOAL #5   Title Patient negotiates stairs (2 rails), ramps & curbs with RW & prosthesis with supervision.  (NEW Target Date: 03/14/2015)   Baseline MET 03/10/2015   Time 1   Period Months   Status Achieved           PT Long Term Goals - 03/10/15 1015    PT LONG TERM GOAL #1   Title Patient verbalizes proper prosthetic care including donning.   NEW Target Date 04/11/2015   Baseline Partially MET 02/11/2015  Patient donnes prosthesis modified independent. He requires cues for adjusting ply socks and addressing issues to call  prosthetist.    Time 2   Period Months   Status On-going   PT LONG TERM GOAL #2   Title Patient tolerates wear of prosthesis >80% of awake hours without skin issues or limb pain.  (Target Date 02/14/2015)   Baseline MET 02/11/2015   Time 2   Period Months   Status Achieved   PT LONG TERM GOAL #3   Title Berg Balance >/= 20/56  (Target Date: 02/14/2015) NEW Target Date 04/11/2015   Baseline 17/56 on 02/14/15   Time 2   Period Months   Status On-going   PT LONG TERM GOAL #4   Title Patient ambulates 250' with rolling walker & prosthesis with supervision to enable community mobility.  ( NEW Target Date 04/11/2015)   Time 2   Period Months   Status On-going   PT LONG TERM GOAL #5   Title Patient negotiates stairs, ramps & curbs with rolling walker & prosthesis with supervision to enable community mobility.  (NEW Target Date: 04/11/2015)   Time 2   Period Months   Status On-going   PT LONG TERM GOAL #6   Title Patient ambulates household around furniture with rolling walker & prosthesis modified independent.  (Target Date: 12/30//2016)   Baseline S level 02/14/15   Time 2   Period Months   Status On-going               Plan - 03/13/15 1100    Clinical Impression Statement Patient has increased his activity level to ambulate in his home with RW without assistance. He has improved safety with RW for community mobility to work with aid outside of PT. PT anticipates discharge next week as should meet LTGs.    Pt will benefit from skilled therapeutic intervention in order to improve on the following deficits Abnormal gait;Decreased activity tolerance;Decreased balance;Decreased endurance;Decreased knowledge of use  of DME;Decreased mobility;Decreased range of motion;Decreased skin integrity;Decreased strength;Postural dysfunction;Prosthetic Dependency   Rehab Potential Good   PT Frequency 2x / week   PT Duration Other (comment)  9 weeks (60 days)   PT Treatment/Interventions  ADLs/Self Care Home Management;DME Instruction;Gait training;Stair training;Functional mobility training;Therapeutic activities;Therapeutic exercise;Balance training;Neuromuscular re-education;Patient/family education;Prosthetic Training;Manual techniques   PT Next Visit Plan Assess LTGs. If he purchases a standard RW with seat, assess sit to/from stand from seat.    Consulted and Agree with Plan of Care Patient   Family Member Consulted CNA        Problem List Patient Active Problem List   Diagnosis Date Noted  . Status post above knee amputation of left lower extremity (HCC) 10/18/2014  . Tachyarrhythmia 10/05/2014  . Aspiration pneumonia (HCC) 10/05/2014  . UTI (lower urinary tract infection) 10/03/2014  . Sepsis due to other etiology (HCC) 10/03/2014  . Gangrene of toe (HCC) 10/03/2014  . Ischemic ulcer of left foot (HCC) 10/03/2014  . Atherosclerotic PVD with ulceration (HCC) 10/03/2014  . Warfarin-induced coagulopathy (HCC) 10/03/2014  . Atrial fibrillation with RVR (HCC) 10/03/2014  . Acute kidney injury (HCC) 10/03/2014  . Hyperkalemia 10/03/2014  . Cellulitis of left leg 10/03/2014  . PAD (peripheral artery disease) (HCC) 09/24/2014  . NSTEMI (non-ST elevated myocardial infarction) (HCC) 10/09/2013  . High risk medication use 05/17/2013  . BPH (benign prostatic hyperplasia) 05/17/2013  . Vitamin D deficiency 05/17/2013  . History of pulmonary embolism (2014) 04/17/2013  . NSVT (nonsustained ventricular tachycardia)- not an ICD candidate 04/17/2013  . NICM (nonischemic cardiomyopathy)- EF 20-25% by Echo 04/12/13 04/17/2013  . Chronic diastolic heart failure, NYHA class 1 (HCC) 03/15/2013  . DVT of lower extremity, bilateral (HCC) 03/15/2013  . Fatigue 11/02/2012  . Constipation 11/02/2012  . Alcohol abuse 08/27/2012  . Syncope 06/21/2012  . Bladder cancer (HCC) 03/15/2012  . Benign localized hyperplasia of prostate with urinary obstruction 03/15/2012  . Benign essential  HTN   . Hyperlipidemia   . PVCs (premature ventricular contractions)   . CAD- non obstructive disease by cath 3/14     , PT, DPT 03/13/2015, 4:02 PM  [facility] [address] , [place], [address] Phone: [phone]   Fax:  [phone]  Name: Christian L Killilea MRN: [id number] Date of Birth: 11/18/1921     

## 2015-03-14 ENCOUNTER — Ambulatory Visit (INDEPENDENT_AMBULATORY_CARE_PROVIDER_SITE_OTHER): Payer: Medicare Other | Admitting: Pharmacist

## 2015-03-14 DIAGNOSIS — Z86711 Personal history of pulmonary embolism: Secondary | ICD-10-CM

## 2015-03-14 DIAGNOSIS — I82403 Acute embolism and thrombosis of unspecified deep veins of lower extremity, bilateral: Secondary | ICD-10-CM | POA: Diagnosis not present

## 2015-03-14 LAB — POCT INR: INR: 2.7

## 2015-03-14 NOTE — Progress Notes (Signed)
Patient checks INR at home with Home INR monitoring.  Billing once per month interupertation fee.  Patient diagnosis - DVT of lower extremity and Atrial fibrillation Procedure code if G0250

## 2015-03-17 ENCOUNTER — Ambulatory Visit: Payer: Medicare Other | Admitting: Physical Therapy

## 2015-03-17 DIAGNOSIS — R6889 Other general symptoms and signs: Secondary | ICD-10-CM

## 2015-03-17 DIAGNOSIS — Z89612 Acquired absence of left leg above knee: Secondary | ICD-10-CM

## 2015-03-17 DIAGNOSIS — R2681 Unsteadiness on feet: Secondary | ICD-10-CM | POA: Diagnosis not present

## 2015-03-17 DIAGNOSIS — R531 Weakness: Secondary | ICD-10-CM

## 2015-03-17 DIAGNOSIS — Z5189 Encounter for other specified aftercare: Secondary | ICD-10-CM | POA: Diagnosis not present

## 2015-03-17 DIAGNOSIS — R2689 Other abnormalities of gait and mobility: Secondary | ICD-10-CM

## 2015-03-17 DIAGNOSIS — R269 Unspecified abnormalities of gait and mobility: Secondary | ICD-10-CM

## 2015-03-17 DIAGNOSIS — Z4789 Encounter for other orthopedic aftercare: Secondary | ICD-10-CM

## 2015-03-17 NOTE — Therapy (Signed)
[facility] [address] [place], [place], [address] Phone: [phone]   Fax:  [phone]  Physical Therapy Treatment  Patient Details  Name: Scott [name] MRN: [id number] Date of Birth: 1921/11/26 Referring Provider: Redge Gainer, MD  Encounter Date: 03/17/2015      PT End of Session - 03/17/15 0925    Visit Number 23   Number of Visits 30   Date for PT Re-Evaluation 04/15/15   Authorization Type Medicare Do G-code every 10th visit   PT Start Time 0925   PT Stop Time 1008   PT Time Calculation (min) 43 min   Equipment Utilized During Treatment Gait belt   Activity Tolerance Patient tolerated treatment well   Behavior During Therapy [facility] for tasks assessed/performed      Past Medical History  Diagnosis Date  . HTN (hypertension)   . Hyperlipidemia   . PVCs (premature ventricular contractions)   . Glaucoma   . PVD (peripheral vascular disease) (HCC)     Right ABI .75, Left .78 (2006)  . Popliteal artery aneurysm, bilateral (HCC) DECUMENTED CHRONIC PARTIAL OCCLUSION--  PT DENIES CLAUDICATION OR ANY OTHER SYMPTOMS  . Frequency of urination   . Nocturia   . DVT, bilateral lower limbs ([place]) 03/2013    a. Bilat DVT dx 03/2013.  . Pulmonary embolism ([place])     a. By CT angio 04/2013.  Marland Kitchen Elevated troponin     a. Adm 12/30-04/2013 - not felt to represent ACS; ?due to PE. b. Patent cors 06/2012.  Marland Kitchen NSVT (nonsustained ventricular tachycardia) ([place])     a. NSVT 06/2012; NSVT also seen during 03/2013 adm. b. Med rx. Not candidate for ICD given adv age.  Marland Kitchen ETOH abuse   . Syncope     a. Felt to be postural syncope related to diuretics 06/2012.  Marland Kitchen Heart murmur   . DVT (deep venous thrombosis) ([place])     "I think in both legs"  . Pneumonia     "couple times"  . DDD (degenerative disc disease)   . Arthritis     "all over"  . Depression   . CAD- non obstructive disease by cath 3/14   . PAD (peripheral artery disease)  ([place])   . Gangrene of toe ([place]) 10/03/2014  . Sepsis due to other etiology ([place]) 10/03/2014  . Chronic systolic heart failure ([place]) 10/04/14    EF improved 50-55%  . Chronic systolic CHF (congestive heart failure) ([place])     a. NICM - patent cors 06/2012, EF 40% at that time. b. 03/2013 eval: EF 20-25%.  . Chronic lower back pain   . Bladder cancer ([place]) 03/15/2012    pt denies this hx on 10/15/2014    Past Surgical History  Procedure Laterality Date  . Transthoracic echocardiogram  09-22-2010    MODERATE LVH/ EF 97%/ GRADE I DIASTOLIC DYSFUNCTION/ AORTIC SCLEROSIS WITHOUT STENOSIS/  RV  SYSTOLIC  MILDLY REDUCED FUNCTION  . Cardiovascular stress test  10-15-2010    LOW RISK NUCLEAR STUDY/ NO EVIDENCE OF ISCHEMIA/ NORMAL EF  . Shoulder surgery  1970's    "don't remember which side or what kind of OR"  . Transurethral resection of bladder tumor  10/07/2011    Procedure: TRANSURETHRAL RESECTION OF BLADDER TUMOR (TURBT);  Surgeon: Malka So, MD;  Location: [facility];  Service: Urology;  Laterality: N/A;  . Cystoscopy  10/07/2011    Procedure: CYSTOSCOPY;  Surgeon: Malka So, MD;  Location: [facility];  Service: Urology;  Laterality: N/A;  . Cystoscopy with biopsy  03/14/2012    Procedure: CYSTOSCOPY WITH BIOPSY;  Surgeon: Malka So, MD;  Location: [facility];  Service: Urology;  Laterality: N/A;  WITH FULGURATION  . Transurethral resection of prostate  03/14/2012    Procedure: TRANSURETHRAL RESECTION OF THE PROSTATE WITH GYRUS INSTRUMENTS;  Surgeon: Malka So, MD;  Location: [facility];  Service: Urology;  Laterality: N/A;  . Tonsillectomy    . Cataract extraction w/ intraocular lens  implant, bilateral Bilateral   . Cardiac catheterization  05-11-2004  DR Washington PLAQUE/ NORMAL LVF/ EF 55%/  NON-OBSTRUCTIVE LAD 25%  . Cardiac catheterization  06/2012  . Eye surgery    . Amputation Left 10/16/2014    Procedure: AMPUTATION ABOVE KNEE- LEFT;   Surgeon: Mal Misty, MD;  Location: [facility];  Service: Vascular;  Laterality: Left;    There were no vitals filed for this visit.  Visit Diagnosis:  Abnormality of gait  Unsteadiness  Functional weakness  Status post above knee amputation of left lower extremity ([place])  Encounter for prosthetic gait training  Decreased functional activity tolerance  Balance problems          [facility] PT Assessment - 03/17/15 0930    Berg Balance Test   Sit to Stand Able to stand  independently using hands   Standing Unsupported Able to stand 2 minutes with supervision   Sitting with Back Unsupported but Feet Supported on Floor or Stool Able to sit safely and securely 2 minutes   Stand to Sit Uses backs of legs against chair to control descent   Transfers Able to transfer safely, definite need of hands   Standing Unsupported with Eyes Closed Able to stand 10 seconds with supervision   Standing Ubsupported with Feet Together Needs help to attain position but able to stand for 30 seconds with feet together   From Standing, Reach Forward with Outstretched Arm Reaches forward but needs supervision   From Standing Position, Pick up Object from Floor Unable to pick up and needs supervision   From Standing Position, Turn to Look Behind Over each Shoulder Needs supervision when turning   Turn 360 Degrees Needs assistance while turning   Standing Unsupported, Alternately Place Feet on Step/Stool Needs assistance to keep from falling or unable to try   Standing Unsupported, One Foot in [employer] balance while stepping or standing   Standing on One Leg Unable to try or needs assist to prevent fall   Total Score 22     Prosthetic Training with Transfemoral Amputation Prosthesis: Patient arrived using RW and not using w/c with CNA supervision.  Patient ambulated 150' including ramp & curb with RW with supervision and stairs with 2 hands on left rail sideways with supervision.  Patient ambulated 200'  from gym to car with RW with supervision.                           PT Short Term Goals - 03/10/15 1015    PT SHORT TERM GOAL #1   Title Patient donnes prosthesis correctly modified independent. (Target Date: 01/15/2015)   Baseline MET 02/11/2015   Time 1   Period Months   Status Achieved   PT SHORT TERM GOAL #2   Title Patient tolerates wear >10hrs total per day without skin issues or limb pain.  (Target Date: 01/15/2015)   Baseline  MET 01/13/2015 Patient reports wear 5hrs 2x/day with  skin issues or limb pain.    Time 1   Period Months   Status Achieved   PT SHORT TERM GOAL #3   Title Patient performs standing balance with RW & prosthesis reaching 10", to floor and manages pants for toileting modified inependent.  (Target Date: 01/15/2015)   Baseline Partially MET 01/13/2015 Patient performs standing balance with RW & prosthesis reaching 10", to floor and simulates managing pants with supervision.    Time 1   Period Months   Status Partially Met   PT SHORT TERM GOAL #4   Title Patient ambulates 150' with RW & prosthesis with SBA.  (NEW Target Date: 03/14/2015)   Baseline MET 03/10/2015   Time 1   Period Months   Status Achieved   PT SHORT TERM GOAL #5   Title Patient negotiates stairs (2 rails), ramps & curbs with RW & prosthesis with supervision.  (NEW Target Date: 03/14/2015)   Baseline MET 03/10/2015   Time 1   Period Months   Status Achieved           PT Long Term Goals - 03/17/15 0925    PT LONG TERM GOAL #1   Title Patient verbalizes proper prosthetic care including donning.   NEW Target Date 04/11/2015   Baseline Partially MET 02/11/2015  Patient donnes prosthesis modified independent. He requires cues for adjusting ply socks and addressing issues to call prosthetist.    Time 2   Period Months   Status On-going   PT LONG TERM GOAL #2   Title Patient tolerates wear of prosthesis >80% of awake hours without skin issues or limb pain.  (Target Date  02/14/2015)   Baseline MET 02/11/2015   Time 2   Period Months   Status Achieved   PT LONG TERM GOAL #3   Title Berg Balance >/= 20/56  (Target Date: 02/14/2015) NEW Target Date 04/11/2015   Baseline MET 03/17/2015 Berg Balance 22/56   Time 2   Period Months   Status Achieved   PT LONG TERM GOAL #4   Title Patient ambulates 250' with rolling walker & prosthesis with supervision to enable community mobility.  ( NEW Target Date 04/11/2015)   Time 2   Period Months   Status On-going   PT LONG TERM GOAL #5   Title Patient negotiates stairs, ramps & curbs with rolling walker & prosthesis with supervision to enable community mobility.  (NEW Target Date: 04/11/2015)   Time 2   Period Months   Status On-going   PT LONG TERM GOAL #6   Title Patient ambulates household around furniture with rolling walker & prosthesis modified independent.  (Target Date: 12/30//2016)   Baseline S level 02/14/15   Time 2   Period Months   Status On-going               Plan - 03/17/15 1015    Clinical Impression Statement Patient improved balance with Merrilee Jansky Balance 22/56 which still indicates risk of falls but lower. Patient has increased activity level outside of PT per his report & CNA. Patient appears ready for discharge next session.    Pt will benefit from skilled therapeutic intervention in order to improve on the following deficits Abnormal gait;Decreased activity tolerance;Decreased balance;Decreased endurance;Decreased knowledge of use of DME;Decreased mobility;Decreased range of motion;Decreased skin integrity;Decreased strength;Postural dysfunction;Prosthetic Dependency   Rehab Potential Good   PT Frequency 2x / week   PT Duration Other (comment)  9  weeks (60 days)   PT Treatment/Interventions ADLs/Self Care Home Management;DME Instruction;Gait training;Stair training;Functional mobility training;Therapeutic activities;Therapeutic exercise;Balance training;Neuromuscular  re-education;Patient/family education;Prosthetic Training;Manual techniques   PT Next Visit Plan Assess remaining LTGs and discharge   Consulted and Agree with Plan of Care Patient   Family Member Consulted CNA        Problem List Patient Active Problem List   Diagnosis Date Noted  . Status post above knee amputation of left lower extremity ([place]) 10/18/2014  . Tachyarrhythmia 10/05/2014  . Aspiration pneumonia ([place]) 10/05/2014  . UTI (lower urinary tract infection) 10/03/2014  . Sepsis due to other etiology ([place]) 10/03/2014  . Gangrene of toe ([place]) 10/03/2014  . Ischemic ulcer of left foot ([place]) 10/03/2014  . Atherosclerotic PVD with ulceration ([place]) 10/03/2014  . Warfarin-induced coagulopathy ([place]) 10/03/2014  . Atrial fibrillation with RVR ([place]) 10/03/2014  . Acute kidney injury ([place]) 10/03/2014  . Hyperkalemia 10/03/2014  . Cellulitis of left leg 10/03/2014  . PAD (peripheral artery disease) ([place]) 09/24/2014  . NSTEMI (non-ST elevated myocardial infarction) ([place]) 10/09/2013  . High risk medication use 05/17/2013  . BPH (benign prostatic hyperplasia) 05/17/2013  . Vitamin D deficiency 05/17/2013  . History of pulmonary embolism (2014) 04/17/2013  . NSVT (nonsustained ventricular tachycardia)- not an ICD candidate 04/17/2013  . NICM (nonischemic cardiomyopathy)- EF 20-25% by Echo 04/12/13 04/17/2013  . Chronic diastolic heart failure, NYHA class 1 ([place]) 03/15/2013  . DVT of lower extremity, bilateral ([place]) 03/15/2013  . Fatigue 11/02/2012  . Constipation 11/02/2012  . Alcohol abuse 08/27/2012  . Syncope 06/21/2012  . Bladder cancer ([place]) 03/15/2012  . Benign localized hyperplasia of prostate with urinary obstruction 03/15/2012  . Benign essential HTN   . Hyperlipidemia   . PVCs (premature ventricular contractions)   . CAD- non obstructive disease by cath 3/14     Jael Waldorf  PT, DPT  03/17/2015, 10:39 AM  [facility] [address] [place], [place], [address] Phone: [phone]   Fax:  [phone]  Name: EDISON NICHOLSON MRN: [id number] Date of Birth: [birth year]

## 2015-03-20 ENCOUNTER — Ambulatory Visit: Payer: Medicare Other | Admitting: Physical Therapy

## 2015-03-20 ENCOUNTER — Other Ambulatory Visit: Payer: Self-pay | Admitting: Family Medicine

## 2015-03-20 ENCOUNTER — Encounter: Payer: Self-pay | Admitting: Physical Therapy

## 2015-03-20 DIAGNOSIS — R269 Unspecified abnormalities of gait and mobility: Secondary | ICD-10-CM

## 2015-03-20 DIAGNOSIS — R531 Weakness: Secondary | ICD-10-CM | POA: Diagnosis not present

## 2015-03-20 DIAGNOSIS — R2681 Unsteadiness on feet: Secondary | ICD-10-CM | POA: Diagnosis not present

## 2015-03-20 DIAGNOSIS — Z89612 Acquired absence of left leg above knee: Secondary | ICD-10-CM | POA: Diagnosis not present

## 2015-03-20 DIAGNOSIS — Z5189 Encounter for other specified aftercare: Secondary | ICD-10-CM | POA: Diagnosis not present

## 2015-03-20 DIAGNOSIS — R6889 Other general symptoms and signs: Secondary | ICD-10-CM | POA: Diagnosis not present

## 2015-03-20 DIAGNOSIS — Z4789 Encounter for other orthopedic aftercare: Secondary | ICD-10-CM

## 2015-03-20 NOTE — Therapy (Signed)
[facility] [address] [place], [place], [address] Phone: [phone]   Fax:  [phone]  Physical Therapy Treatment  Patient Details  Name: Scott [name] MRN: [id number] Date of Birth: [birth year] Referring Provider: Redge Gainer, MD   PHYSICAL THERAPY DISCHARGE SUMMARY  Visits from Start of Care: 24  Current functional level related to goals / functional outcomes: See below   Remaining deficits: See below   Education / Equipment: Prosthetic care Plan: Patient agrees to discharge.  Patient goals were met. Patient is being discharged due to meeting the stated rehab goals.  ?????        Encounter Date: 03/20/2015      PT End of Session - 03/20/15 1026    Visit Number 24   Number of Visits 30   Date for PT Re-Evaluation 04/15/15   Authorization Type Medicare Do G-code every 10th visit   PT Start Time 0939   PT Stop Time 1023   PT Time Calculation (min) 44 min   Equipment Utilized During Treatment Gait belt   Activity Tolerance Patient tolerated treatment well   Behavior During Therapy WFL for tasks assessed/performed      Past Medical History  Diagnosis Date  . HTN (hypertension)   . Hyperlipidemia   . PVCs (premature ventricular contractions)   . Glaucoma   . PVD (peripheral vascular disease) (HCC)     Right ABI .75, Left .78 (2006)  . Popliteal artery aneurysm, bilateral (HCC) DECUMENTED CHRONIC PARTIAL OCCLUSION--  PT DENIES CLAUDICATION OR ANY OTHER SYMPTOMS  . Frequency of urination   . Nocturia   . DVT, bilateral lower limbs ([place]) 03/2013    a. Bilat DVT dx 03/2013.  . Pulmonary embolism ([place])     a. By CT angio 04/2013.  Marland Kitchen Elevated troponin     a. Adm 12/30-04/2013 - not felt to represent ACS; ?due to PE. b. Patent cors 06/2012.  Marland Kitchen NSVT (nonsustained ventricular tachycardia) ([place])     a. NSVT 06/2012; NSVT also seen during 03/2013 adm. b. Med rx. Not candidate for ICD given adv age.  Marland Kitchen  ETOH abuse   . Syncope     a. Felt to be postural syncope related to diuretics 06/2012.  Marland Kitchen Heart murmur   . DVT (deep venous thrombosis) ([place])     "I think in both legs"  . Pneumonia     "couple times"  . DDD (degenerative disc disease)   . Arthritis     "all over"  . Depression   . CAD- non obstructive disease by cath 3/14   . PAD (peripheral artery disease) ([place])   . Gangrene of toe ([place]) 10/03/2014  . Sepsis due to other etiology ([place]) 10/03/2014  . Chronic systolic heart failure ([place]) 10/04/14    EF improved 50-55%  . Chronic systolic CHF (congestive heart failure) ([place])     a. NICM - patent cors 06/2012, EF 40% at that time. b. 03/2013 eval: EF 20-25%.  . Chronic lower back pain   . Bladder cancer ([place]) 03/15/2012    pt denies this hx on 10/15/2014    Past Surgical History  Procedure Laterality Date  . Transthoracic echocardiogram  09-22-2010    MODERATE LVH/ EF 29%/ GRADE I DIASTOLIC DYSFUNCTION/ AORTIC SCLEROSIS WITHOUT STENOSIS/  RV  SYSTOLIC  MILDLY REDUCED FUNCTION  . Cardiovascular stress test  10-15-2010    LOW RISK NUCLEAR STUDY/ NO EVIDENCE OF ISCHEMIA/ NORMAL EF  . Shoulder surgery  1970's    "  don't remember which side or what kind of OR"  . Transurethral resection of bladder tumor  10/07/2011    Procedure: TRANSURETHRAL RESECTION OF BLADDER TUMOR (TURBT);  Surgeon: Malka So, MD;  Location: [facility];  Service: Urology;  Laterality: N/A;  . Cystoscopy  10/07/2011    Procedure: CYSTOSCOPY;  Surgeon: Malka So, MD;  Location: [facility];  Service: Urology;  Laterality: N/A;  . Cystoscopy with biopsy  03/14/2012    Procedure: CYSTOSCOPY WITH BIOPSY;  Surgeon: Malka So, MD;  Location: [facility];  Service: Urology;  Laterality: N/A;  WITH FULGURATION  . Transurethral resection of prostate  03/14/2012    Procedure: TRANSURETHRAL RESECTION OF THE PROSTATE WITH GYRUS INSTRUMENTS;  Surgeon: Malka So, MD;  Location: [facility];  Service:  Urology;  Laterality: N/A;  . Tonsillectomy    . Cataract extraction w/ intraocular lens  implant, bilateral Bilateral   . Cardiac catheterization  05-11-2004  DR Edgemont PLAQUE/ NORMAL LVF/ EF 55%/  NON-OBSTRUCTIVE LAD 25%  . Cardiac catheterization  06/2012  . Eye surgery    . Amputation Left 10/16/2014    Procedure: AMPUTATION ABOVE KNEE- LEFT;  Surgeon: Mal Misty, MD;  Location: [facility];  Service: Vascular;  Laterality: Left;    There were no vitals filed for this visit.  Visit Diagnosis:  Abnormality of gait  Unsteadiness  Functional weakness  Status post above knee amputation of left lower extremity ([place])  Encounter for prosthetic gait training      Subjective Assessment - 03/20/15 0945    Subjective No falls. He has been using RW around house alone & with CNA outside of house.    Currently in Pain? No/denies             Prosthetics Assessment - 03/20/15 0940    Prosthetics   Prosthetic Care Independent with Skin check;Residual limb care;Prosthetic cleaning;Ply sock cleaning;Correct ply sock adjustment;Proper wear schedule/adjustment   Donning prosthesis  Modified independent (Device/Increase time)   Doffing prosthesis  Modified independent (Device/Increase time)                    [facility] Adult PT Treatment/Exercise - 03/20/15 0940    Transfers   Sit to Stand 6: Modified independent (Device/Increase time);With upper extremity assist;With armrests;From chair/3-in-1  with RW & prosthesis   Stand to Sit 6: Modified independent (Device/Increase time);With upper extremity assist;With armrests;To chair/3-in-1  with RW & prosthesis   Ambulation/Gait   Ambulation/Gait Yes   Ambulation/Gait Assistance 6: Modified independent (Device/Increase time);5: Supervision  Mod Indep. household & SBA community   Ambulation Distance (Feet) 275 Feet  110' around furniture; 275' around clinic, >200' in/out clin   Assistive device  Prosthesis;Rolling walker   Gait Pattern Step-to pattern;Decreased step length - right;Decreased stance time - left;Decreased stride length;Decreased hip/knee flexion - left;Decreased weight shift to left;Left hip hike;Trunk flexed;Abducted - left   Ambulation Surface Indoor;Level   Ramp 5: Supervision  RW & prosthesis    Curb 5: Supervision  RW & prosthesis   Prosthetics   Current prosthetic wear tolerance (days/week)  daily   Current prosthetic wear tolerance (#hours/day)  wearing >90% of awake hours   Residual limb condition  intact                  PT Short Term Goals - 03/10/15 1015    PT SHORT TERM GOAL #1   Title Patient donnes prosthesis  correctly modified independent. (Target Date: 01/15/2015)   Baseline MET 02/11/2015   Time 1   Period Months   Status Achieved   PT SHORT TERM GOAL #2   Title Patient tolerates wear >10hrs total per day without skin issues or limb pain.  (Target Date: 01/15/2015)   Baseline MET 01/13/2015 Patient reports wear 5hrs 2x/day with  skin issues or limb pain.    Time 1   Period Months   Status Achieved   PT SHORT TERM GOAL #3   Title Patient performs standing balance with RW & prosthesis reaching 10", to floor and manages pants for toileting modified inependent.  (Target Date: 01/15/2015)   Baseline Partially MET 01/13/2015 Patient performs standing balance with RW & prosthesis reaching 10", to floor and simulates managing pants with supervision.    Time 1   Period Months   Status Partially Met   PT SHORT TERM GOAL #4   Title Patient ambulates 150' with RW & prosthesis with SBA.  (NEW Target Date: 03/14/2015)   Baseline MET 03/10/2015   Time 1   Period Months   Status Achieved   PT SHORT TERM GOAL #5   Title Patient negotiates stairs (2 rails), ramps & curbs with RW & prosthesis with supervision.  (NEW Target Date: 03/14/2015)   Baseline MET 03/10/2015   Time 1   Period Months   Status Achieved           PT Long Term Goals -  03/20/15 0940    PT LONG TERM GOAL #1   Title Patient verbalizes proper prosthetic care including donning.   NEW Target Date 04/11/2015   Baseline MET 03/20/2015   Time 2   Period Months   Status Achieved   PT LONG TERM GOAL #2   Title Patient tolerates wear of prosthesis >80% of awake hours without skin issues or limb pain.  (Target Date 02/14/2015)   Baseline MET 03/20/2015   Time 2   Period Months   Status Achieved   PT LONG TERM GOAL #3   Title Berg Balance >/= 20/56  (Target Date: 02/14/2015) NEW Target Date 04/11/2015   Baseline MET 03/17/2015 Berg Balance 22/56   Time 2   Period Months   Status Achieved   PT LONG TERM GOAL #4   Title Patient ambulates 250' with rolling walker & prosthesis with supervision to enable community mobility.  ( NEW Target Date 04/11/2015)   Baseline MET 03/20/2015   Time 2   Period Months   Status Achieved   PT LONG TERM GOAL #5   Title Patient negotiates stairs, ramps & curbs with rolling walker & prosthesis with supervision to enable community mobility.  (NEW Target Date: 04/11/2015)   Baseline MET 03/20/2015   Time 2   Period Months   Status Achieved   PT LONG TERM GOAL #6   Title Patient ambulates household around furniture with rolling walker & prosthesis modified independent.  (Target Date: 12/30//2016)   Baseline MET 03/20/2015   Time 2   Period Months   Status Achieved               Plan - 03/20/15 1026    Clinical Impression Statement Patient met all LTGs set for PT. Patient appears to be safely functioning with prosthesis and rolling walker - in his home modified independent and in community with supervision. He reports wearing prosthesis most of awake hours without issues.    Pt will benefit from skilled therapeutic intervention in order to  improve on the following deficits Abnormal gait;Decreased activity tolerance;Decreased balance;Decreased endurance;Decreased knowledge of use of DME;Decreased mobility;Decreased range of  motion;Decreased skin integrity;Decreased strength;Postural dysfunction;Prosthetic Dependency   Rehab Potential Good   PT Frequency 2x / week   PT Duration Other (comment)  9 weeks (60 days)   PT Treatment/Interventions ADLs/Self Care Home Management;DME Instruction;Gait training;Stair training;Functional mobility training;Therapeutic activities;Therapeutic exercise;Balance training;Neuromuscular re-education;Patient/family education;Prosthetic Training;Manual techniques   PT Next Visit Plan discharge PT   Consulted and Agree with Plan of Care Patient   Family Member Consulted CNA          G-Codes - 03/23/2015 1029    Functional Assessment Tool Used Pt mod I with donning prosthesis. Skin WNL. Pt wearing prosthesis for all awake hours. Patient verbalizes proper prosthetic care.    Functional Limitation Self care   Mobility: Walking and Moving Around Goal Status [phone]) At least 20 percent but less than 40 percent impaired, limited or restricted   Self Care Current Status (H8299) At least 20 percent but less than 40 percent impaired, limited or restricted      Problem List Patient Active Problem List   Diagnosis Date Noted  . Status post above knee amputation of left lower extremity ([place]) 10/18/2014  . Tachyarrhythmia 10/05/2014  . Aspiration pneumonia ([place]) 10/05/2014  . UTI (lower urinary tract infection) 10/03/2014  . Sepsis due to other etiology ([place]) 10/03/2014  . Gangrene of toe ([place]) 10/03/2014  . Ischemic ulcer of left foot ([place]) 10/03/2014  . Atherosclerotic PVD with ulceration ([place]) 10/03/2014  . Warfarin-induced coagulopathy ([place]) 10/03/2014  . Atrial fibrillation with RVR ([place]) 10/03/2014  . Acute kidney injury ([place]) 10/03/2014  . Hyperkalemia 10/03/2014  . Cellulitis of left leg 10/03/2014  . PAD (peripheral artery disease) ([place]) 09/24/2014  . NSTEMI (non-ST elevated myocardial infarction) ([place]) 10/09/2013  . High risk medication use 05/17/2013  . BPH (benign  prostatic hyperplasia) 05/17/2013  . Vitamin D deficiency 05/17/2013  . History of pulmonary embolism (2014) 04/17/2013  . NSVT (nonsustained ventricular tachycardia)- not an ICD candidate 04/17/2013  . NICM (nonischemic cardiomyopathy)- EF 20-25% by Echo 04/12/13 04/17/2013  . Chronic diastolic heart failure, NYHA class 1 ([place]) 03/15/2013  . DVT of lower extremity, bilateral ([place]) 03/15/2013  . Fatigue 11/02/2012  . Constipation 11/02/2012  . Alcohol abuse 08/27/2012  . Syncope 06/21/2012  . Bladder cancer ([place]) 03/15/2012  . Benign localized hyperplasia of prostate with urinary obstruction 03/15/2012  . Benign essential HTN   . Hyperlipidemia   . PVCs (premature ventricular contractions)   . CAD- non obstructive disease by cath 3/14     Mindel Friscia  PT, DPT  Mar 23, 2015, 10:34 AM  [facility] [address] [place], [place], [address] Phone: [phone]   Fax:  [phone]  Name: Scott [name] MRN: [id number] Date of Birth: 16-Jul-1921

## 2015-03-24 ENCOUNTER — Encounter: Payer: Medicare Other | Admitting: Physical Therapy

## 2015-03-27 ENCOUNTER — Encounter: Payer: Medicare Other | Admitting: Physical Therapy

## 2015-03-27 ENCOUNTER — Other Ambulatory Visit: Payer: Self-pay | Admitting: Physician Assistant

## 2015-03-28 LAB — POCT INR: INR: 2.7

## 2015-03-31 ENCOUNTER — Ambulatory Visit (INDEPENDENT_AMBULATORY_CARE_PROVIDER_SITE_OTHER): Payer: Medicare Other | Admitting: Pharmacist

## 2015-03-31 ENCOUNTER — Encounter: Payer: Medicare Other | Admitting: Physical Therapy

## 2015-03-31 DIAGNOSIS — I82403 Acute embolism and thrombosis of unspecified deep veins of lower extremity, bilateral: Secondary | ICD-10-CM

## 2015-03-31 DIAGNOSIS — Z86711 Personal history of pulmonary embolism: Secondary | ICD-10-CM

## 2015-03-31 NOTE — Progress Notes (Signed)
No charge for labs or visit - INR check through home monitoring system  

## 2015-04-03 ENCOUNTER — Encounter: Payer: Medicare Other | Admitting: Physical Therapy

## 2015-04-08 ENCOUNTER — Encounter: Payer: Medicare Other | Admitting: Physical Therapy

## 2015-04-08 ENCOUNTER — Ambulatory Visit (INDEPENDENT_AMBULATORY_CARE_PROVIDER_SITE_OTHER): Payer: Medicare Other | Admitting: Pharmacist

## 2015-04-08 DIAGNOSIS — Z86711 Personal history of pulmonary embolism: Secondary | ICD-10-CM

## 2015-04-08 DIAGNOSIS — I82403 Acute embolism and thrombosis of unspecified deep veins of lower extremity, bilateral: Secondary | ICD-10-CM

## 2015-04-08 LAB — POCT INR: INR: 2.7

## 2015-04-08 NOTE — Progress Notes (Signed)
No charge for labs or visit - INR check through home monitoring system  

## 2015-04-10 ENCOUNTER — Encounter: Payer: Medicare Other | Admitting: Physical Therapy

## 2015-04-11 ENCOUNTER — Emergency Department (HOSPITAL_COMMUNITY): Payer: Medicare Other

## 2015-04-11 ENCOUNTER — Encounter (HOSPITAL_COMMUNITY): Payer: Self-pay | Admitting: Emergency Medicine

## 2015-04-11 ENCOUNTER — Telehealth: Payer: Self-pay

## 2015-04-11 ENCOUNTER — Emergency Department (HOSPITAL_COMMUNITY)
Admission: EM | Admit: 2015-04-11 | Discharge: 2015-04-11 | Disposition: A | Discharge status: Home / Self Care | Payer: Medicare Other | Attending: Emergency Medicine | Admitting: Emergency Medicine

## 2015-04-11 DIAGNOSIS — Z8551 Personal history of malignant neoplasm of bladder: Secondary | ICD-10-CM | POA: Insufficient documentation

## 2015-04-11 DIAGNOSIS — R319 Hematuria, unspecified: Secondary | ICD-10-CM | POA: Diagnosis not present

## 2015-04-11 DIAGNOSIS — I2510 Atherosclerotic heart disease of native coronary artery without angina pectoris: Secondary | ICD-10-CM | POA: Diagnosis not present

## 2015-04-11 DIAGNOSIS — F329 Major depressive disorder, single episode, unspecified: Secondary | ICD-10-CM | POA: Diagnosis not present

## 2015-04-11 DIAGNOSIS — R011 Cardiac murmur, unspecified: Secondary | ICD-10-CM | POA: Diagnosis not present

## 2015-04-11 DIAGNOSIS — I5022 Chronic systolic (congestive) heart failure: Secondary | ICD-10-CM | POA: Diagnosis not present

## 2015-04-11 DIAGNOSIS — Z8701 Personal history of pneumonia (recurrent): Secondary | ICD-10-CM | POA: Diagnosis not present

## 2015-04-11 DIAGNOSIS — Z87891 Personal history of nicotine dependence: Secondary | ICD-10-CM | POA: Insufficient documentation

## 2015-04-11 DIAGNOSIS — Z8619 Personal history of other infectious and parasitic diseases: Secondary | ICD-10-CM | POA: Insufficient documentation

## 2015-04-11 DIAGNOSIS — Z86711 Personal history of pulmonary embolism: Secondary | ICD-10-CM | POA: Diagnosis not present

## 2015-04-11 DIAGNOSIS — Z7901 Long term (current) use of anticoagulants: Secondary | ICD-10-CM | POA: Diagnosis not present

## 2015-04-11 DIAGNOSIS — N390 Urinary tract infection, site not specified: Secondary | ICD-10-CM | POA: Insufficient documentation

## 2015-04-11 DIAGNOSIS — Z79899 Other long term (current) drug therapy: Secondary | ICD-10-CM | POA: Diagnosis not present

## 2015-04-11 DIAGNOSIS — G8929 Other chronic pain: Secondary | ICD-10-CM | POA: Insufficient documentation

## 2015-04-11 DIAGNOSIS — Z86718 Personal history of other venous thrombosis and embolism: Secondary | ICD-10-CM | POA: Diagnosis not present

## 2015-04-11 DIAGNOSIS — Z8739 Personal history of other diseases of the musculoskeletal system and connective tissue: Secondary | ICD-10-CM | POA: Insufficient documentation

## 2015-04-11 DIAGNOSIS — E785 Hyperlipidemia, unspecified: Secondary | ICD-10-CM | POA: Insufficient documentation

## 2015-04-11 DIAGNOSIS — I10 Essential (primary) hypertension: Secondary | ICD-10-CM | POA: Insufficient documentation

## 2015-04-11 DIAGNOSIS — Z7951 Long term (current) use of inhaled steroids: Secondary | ICD-10-CM | POA: Insufficient documentation

## 2015-04-11 DIAGNOSIS — Z9889 Other specified postprocedural states: Secondary | ICD-10-CM | POA: Diagnosis not present

## 2015-04-11 DIAGNOSIS — I739 Peripheral vascular disease, unspecified: Secondary | ICD-10-CM | POA: Insufficient documentation

## 2015-04-11 LAB — CBC WITH DIFFERENTIAL/PLATELET
Basophils Absolute: 0 10*3/uL (ref 0.0–0.1)
Basophils Relative: 0 %
Eosinophils Absolute: 0.1 10*3/uL (ref 0.0–0.7)
Eosinophils Relative: 3 %
HCT: 36.7 % — ABNORMAL LOW (ref 39.0–52.0)
Hemoglobin: 12 g/dL — ABNORMAL LOW (ref 13.0–17.0)
Lymphocytes Relative: 28 %
Lymphs Abs: 1.2 10*3/uL (ref 0.7–4.0)
MCH: 26.3 pg (ref 26.0–34.0)
MCHC: 32.7 g/dL (ref 30.0–36.0)
MCV: 80.5 fL (ref 78.0–100.0)
Monocytes Absolute: 0.4 10*3/uL (ref 0.1–1.0)
Monocytes Relative: 9 %
Neutro Abs: 2.5 10*3/uL (ref 1.7–7.7)
Neutrophils Relative %: 60 %
Platelets: 198 10*3/uL (ref 150–400)
RBC: 4.56 MIL/uL (ref 4.22–5.81)
RDW: 18.4 % — ABNORMAL HIGH (ref 11.5–15.5)
WBC: 4.1 10*3/uL (ref 4.0–10.5)

## 2015-04-11 LAB — PROTIME-INR
INR: 2.07 — ABNORMAL HIGH (ref 0.00–1.49)
Prothrombin Time: 23.2 seconds — ABNORMAL HIGH (ref 11.6–15.2)

## 2015-04-11 LAB — COMPREHENSIVE METABOLIC PANEL
ALT: 13 U/L — ABNORMAL LOW (ref 17–63)
AST: 19 U/L (ref 15–41)
Albumin: 3.8 g/dL (ref 3.5–5.0)
Alkaline Phosphatase: 166 U/L — ABNORMAL HIGH (ref 38–126)
Anion gap: 7 (ref 5–15)
BUN: 31 mg/dL — ABNORMAL HIGH (ref 6–20)
CO2: 26 mmol/L (ref 22–32)
Calcium: 9.4 mg/dL (ref 8.9–10.3)
Chloride: 108 mmol/L (ref 101–111)
Creatinine, Ser: 1.3 mg/dL — ABNORMAL HIGH (ref 0.61–1.24)
GFR calc Af Amer: 54 mL/min — ABNORMAL LOW (ref >60)
GFR calc non Af Amer: 46 mL/min — ABNORMAL LOW (ref >60)
Glucose, Bld: 84 mg/dL (ref 65–99)
Potassium: 4.1 mmol/L (ref 3.5–5.1)
Sodium: 141 mmol/L (ref 135–145)
Total Bilirubin: 0.5 mg/dL (ref 0.3–1.2)
Total Protein: 6.9 g/dL (ref 6.5–8.1)

## 2015-04-11 LAB — URINE MICROSCOPIC-ADD ON

## 2015-04-11 LAB — URINALYSIS, ROUTINE W REFLEX MICROSCOPIC
Bilirubin Urine: NEGATIVE
Glucose, UA: 100 mg/dL — AB
Ketones, ur: 15 mg/dL — AB
Nitrite: POSITIVE — AB
Protein, ur: >300 mg/dL — AB
Specific Gravity, Urine: 1.015 (ref 1.005–1.030)
pH: 6.5 (ref 5.0–8.0)

## 2015-04-11 MED ORDER — SODIUM CHLORIDE 0.9 % IJ SOLN
INTRAMUSCULAR | Status: AC
  Filled 2015-04-11: qty 500

## 2015-04-11 MED ORDER — DEXTROSE 5 % IV SOLN
1.0000 g | Freq: Once | INTRAVENOUS | Status: AC
  Administered 2015-04-11: 1 g via INTRAVENOUS
  Filled 2015-04-11: qty 10

## 2015-04-11 MED ORDER — SODIUM CHLORIDE 0.9 % IV BOLUS (SEPSIS)
1000.0000 mL | Freq: Once | INTRAVENOUS | Status: AC
  Administered 2015-04-11: 1000 mL via INTRAVENOUS

## 2015-04-11 MED ORDER — CEPHALEXIN 500 MG PO CAPS: 500.0000 mg | ORAL_CAPSULE | Freq: Four times a day (QID) | ORAL | Status: DC

## 2015-04-11 MED ORDER — IOHEXOL 300 MG/ML  SOLN
125.0000 mL | Freq: Once | INTRAMUSCULAR | Status: AC | PRN
  Administered 2015-04-11: 150 mL via INTRAVENOUS

## 2015-04-11 MED ORDER — SODIUM CHLORIDE 0.9 % IV SOLN
INTRAVENOUS | Status: AC
  Filled 2015-04-11: qty 250

## 2015-04-11 NOTE — ED Notes (Signed)
Patient complaining of blood in his urine x 2-3 days. States "my back hurts but it's been hurting for months."

## 2015-04-11 NOTE — Telephone Encounter (Signed)
Caregiver for patients wife called office and stated that Scott [name] asked him to take a look at his urine and caregiver states that urine is very red and has large blood clots in it. Patient states that he is not having any pain. Advised caregiver that we would be happy to check a urine sample if they would like to bring it by but if urine is as bad as caregiver is stating then patient probably needs to go to ER. Caregiver verbalized understanding and will notify son

## 2015-04-11 NOTE — ED Provider Notes (Signed)
CSN: [id number]     Arrival date & time 04/11/15  1608 History   First MD Initiated Contact with Patient 04/11/15 1645     Chief Complaint  Patient presents with  . Hematuria     (Consider location/radiation/quality/duration/timing/severity/associated sxs/prior Treatment) Patient is a 81 y.o. male presenting with hematuria.  Hematuria This is a new problem. The current episode started more than 2 days ago. The problem occurs constantly. The problem has been gradually worsening. Pertinent negatives include no chest pain, no abdominal pain, no headaches and no shortness of breath. Nothing aggravates the symptoms. Nothing relieves the symptoms. He has tried nothing for the symptoms. The treatment provided no relief.    Past Medical History  Diagnosis Date  . HTN (hypertension)   . Hyperlipidemia   . PVCs (premature ventricular contractions)   . Glaucoma   . PVD (peripheral vascular disease) (HCC)     Right ABI .75, Left .78 (2006)  . Popliteal artery aneurysm, bilateral (HCC) DECUMENTED CHRONIC PARTIAL OCCLUSION--  PT DENIES CLAUDICATION OR ANY OTHER SYMPTOMS  . Frequency of urination   . Nocturia   . DVT, bilateral lower limbs ([place]) 03/2013    a. Bilat DVT dx 03/2013.  . Pulmonary embolism ([place])     a. By CT angio 04/2013.  Marland Kitchen Elevated troponin     a. Adm 12/30-04/2013 - not felt to represent ACS; ?due to PE. b. Patent cors 06/2012.  Marland Kitchen NSVT (nonsustained ventricular tachycardia) ([place])     a. NSVT 06/2012; NSVT also seen during 03/2013 adm. b. Med rx. Not candidate for ICD given adv age.  Marland Kitchen ETOH abuse   . Syncope     a. Felt to be postural syncope related to diuretics 06/2012.  Marland Kitchen Heart murmur   . DVT (deep venous thrombosis) ([place])     "I think in both legs"  . Pneumonia     "couple times"  . DDD (degenerative disc disease)   . Arthritis     "all over"  . Depression   . CAD- non obstructive disease by cath 3/14   . PAD (peripheral artery disease) ([place])   . Gangrene of toe ([place])  10/03/2014  . Sepsis due to other etiology ([place]) 10/03/2014  . Chronic systolic heart failure ([place]) 10/04/14    EF improved 50-55%  . Chronic systolic CHF (congestive heart failure) ([place])     a. NICM - patent cors 06/2012, EF 40% at that time. b. 03/2013 eval: EF 20-25%.  . Chronic lower back pain   . Bladder cancer ([place]) 03/15/2012    pt denies this hx on 10/15/2014   Past Surgical History  Procedure Laterality Date  . Transthoracic echocardiogram  09-22-2010    MODERATE LVH/ EF 123456 GRADE I DIASTOLIC DYSFUNCTION/ AORTIC SCLEROSIS WITHOUT STENOSIS/  RV  SYSTOLIC  MILDLY REDUCED FUNCTION  . Cardiovascular stress test  10-15-2010    LOW RISK NUCLEAR STUDY/ NO EVIDENCE OF ISCHEMIA/ NORMAL EF  . Shoulder surgery  1970's    "don't remember which side or what kind of OR"  . Transurethral resection of bladder tumor  10/07/2011    Procedure: TRANSURETHRAL RESECTION OF BLADDER TUMOR (TURBT);  Surgeon: Malka So, MD;  Location: [facility];  Service: Urology;  Laterality: N/A;  . Cystoscopy  10/07/2011    Procedure: CYSTOSCOPY;  Surgeon: Malka So, MD;  Location: [facility];  Service: Urology;  Laterality: N/A;  . Cystoscopy with biopsy  03/14/2012    Procedure: CYSTOSCOPY  WITH BIOPSY;  Surgeon: Malka So, MD;  Location: [facility];  Service: Urology;  Laterality: N/A;  WITH FULGURATION  . Transurethral resection of prostate  03/14/2012    Procedure: TRANSURETHRAL RESECTION OF THE PROSTATE WITH GYRUS INSTRUMENTS;  Surgeon: Malka So, MD;  Location: [facility];  Service: Urology;  Laterality: N/A;  . Tonsillectomy    . Cataract extraction w/ intraocular lens  implant, bilateral Bilateral   . Cardiac catheterization  05-11-2004  DR Windber PLAQUE/ NORMAL LVF/ EF 55%/  NON-OBSTRUCTIVE LAD 25%  . Cardiac catheterization  06/2012  . Eye surgery    . Amputation Left 10/16/2014    Procedure: AMPUTATION ABOVE KNEE- LEFT;  Surgeon: Mal Misty, MD;   [facility]: [facility];  Service: Vascular;  Laterality: Left;   Family History  Problem Relation Age of Onset  . Hypertension Mother   . Arthritis Mother   . Lung cancer Sister   . Deep vein thrombosis Father   . Early death Brother    Social History  Substance Use Topics  . Smoking status: Former Smoker -- 1.00 packs/day for 20 years    Types: Cigarettes    Start date: 04/12/1942    Quit date: 04/13/1975  . Smokeless tobacco: Never Used  . Alcohol Use: No     Comment: 10/09/2013 "quit drinking in early June 2015"    Review of Systems  Constitutional: Negative for fever, chills and fatigue.  HENT: Negative for congestion and drooling.   Eyes: Negative for pain.  Respiratory: Negative for shortness of breath.   Cardiovascular: Negative for chest pain.  Gastrointestinal: Negative for nausea, abdominal pain, diarrhea and constipation.  Endocrine: Negative for polydipsia and polyuria.  Genitourinary: Positive for hematuria. Negative for decreased urine volume.  Musculoskeletal: Negative for back pain.  Neurological: Negative for headaches.  All other systems reviewed and are negative.     Allergies  Lipitor  Home Medications   Prior to Admission medications   Medication Sig Start Date End Date Taking? Authorizing Provider  alum & mag hydroxide-simeth (MAALOX/MYLANTA) 200-200-20 MG/5ML suspension Take 30 mLs by mouth every 6 (six) hours as needed for indigestion or heartburn (dyspepsia). 11/04/14  Yes Ivan Anchors Love, PA-C  donepezil (ARICEPT) 10 MG tablet TAKE ONE TABLET AT BEDTIME 02/19/15  Yes Chipper Herb, MD  ferrous sulfate (FERROUSUL) 325 (65 FE) MG tablet Take 1 tablet (325 mg total) by mouth daily with breakfast. 01/13/15  Yes Chipper Herb, MD  fluticasone [facility]) 50 MCG/ACT nasal spray Place 2 sprays into both nostrils daily. Patient taking differently: Place 2 sprays into both nostrils daily as needed.  03/26/14  Yes Chipper Herb, MD  folic acid (FOLVITE) 1 MG  tablet Take 1 tablet (1 mg total) by mouth daily. 01/13/15  Yes Chipper Herb, MD  furosemide (LASIX) 40 MG tablet Take 40 mg by mouth every morning.  01/07/15  Yes Historical Provider, MD  gabapentin (NEURONTIN) 100 MG capsule TAKE (2) CAPSULES TWICE DAILY. Patient taking differently: Take 100 mg by mouth 2 (two) times daily.  01/31/15  Yes Chipper Herb, MD  hydrocerin (EUCERIN) CREA Apply 1 application topically 2 (two) times daily. 11/04/14  Yes Ivan Anchors Love, PA-C  sertraline (ZOLOFT) 50 MG tablet Take 50 mg by mouth every morning.   Yes Historical Provider, MD  simvastatin (ZOCOR) 10 MG tablet TAKE 1 TABLET DAILY 03/20/15  Yes Chipper Herb, MD  tamsulosin (FLOMAX) 0.4 MG CAPS capsule  TAKE (1) CAPSULE DAILY 09/20/14  Yes Chipper Herb, MD  ULORIC 40 MG tablet TAKE 1 TABLET DAILY 02/13/15  Yes Chipper Herb, MD  warfarin (COUMADIN) 2 MG tablet TAKE 1 TABLET ALONG WITH 2.5MG  TO EQUAL 4.5 MG DAILY Patient taking differently: TAKE 4.5MG  TOTAL EVERY DAY IN THE EVENING, EXCEPT ON SATURDAYS, TAKE 2.5MG  ONLY. 02/07/15  Yes Chipper Herb, MD  warfarin (COUMADIN) 2.5 MG tablet TAKE UP TO 3 TABLETS DAILY AS DIRECTED BY CPP Patient taking differently: TAKE 4.5MG  TOTAL EVERY DAY IN THE EVENING, EXCEPT ON SATURDAYS, TAKE 2.5MG  ONLY. 03/27/15  Yes Chipper Herb, MD  cephALEXin (KEFLEX) 500 MG capsule Take 1 capsule (500 mg total) by mouth 4 (four) times daily. 04/11/15   Merrily Pew, MD  nitroGLYCERIN (NITROSTAT) 0.4 MG SL tablet Place 1 tablet (0.4 mg total) under the tongue every 5 (five) minutes as needed for chest pain (up to 3 doses). 04/19/13   Dayna N Dunn, PA-C   BP 186/105 mmHg  Pulse 71  Temp(Src) 98.6 F (37 C) (Oral)  Ht 5\' 7"  (1.702 m)  Wt 165 lb (74.844 kg)  BMI 25.84 kg/m2  SpO2 97% Physical Exam  Constitutional: He is oriented to person, place, and time. He appears well-developed and well-nourished.  HENT:  Head: Normocephalic and atraumatic.  Neck: Normal range of motion.   Cardiovascular: Normal rate.   Pulmonary/Chest: Effort normal and breath sounds normal. No respiratory distress. He has no wheezes. He has no rales.  Abdominal: Soft. Bowel sounds are normal. He exhibits no distension. There is no tenderness. There is no rebound.  Musculoskeletal: Normal range of motion. He exhibits no edema or tenderness.  Neurological: He is alert and oriented to person, place, and time. No cranial nerve deficit.  Skin: Skin is warm and dry. No rash noted. No erythema.  Nursing note and vitals reviewed.   ED Course  Procedures (including critical care time) Labs Review Labs Reviewed  URINALYSIS, ROUTINE W REFLEX MICROSCOPIC (NOT AT [facility]) - Abnormal; Notable for the following:    Color, Urine RED (*)    APPearance HAZY (*)    Glucose, UA 100 (*)    Hgb urine dipstick LARGE (*)    Ketones, ur 15 (*)    Protein, ur >300 (*)    Nitrite POSITIVE (*)    Leukocytes, UA MODERATE (*)    All other components within normal limits  URINE MICROSCOPIC-ADD ON - Abnormal; Notable for the following:    Squamous Epithelial / LPF 0-5 (*)    Bacteria, UA FEW (*)    All other components within normal limits  CBC WITH DIFFERENTIAL/PLATELET - Abnormal; Notable for the following:    Hemoglobin 12.0 (*)    HCT 36.7 (*)    RDW 18.4 (*)    All other components within normal limits  COMPREHENSIVE METABOLIC PANEL - Abnormal; Notable for the following:    BUN 31 (*)    Creatinine, Ser 1.30 (*)    ALT 13 (*)    Alkaline Phosphatase 166 (*)    GFR calc non Af Amer 46 (*)    GFR calc Af Amer 54 (*)    All other components within normal limits  PROTIME-INR - Abnormal; Notable for the following:    Prothrombin Time 23.2 (*)    INR 2.07 (*)    All other components within normal limits  PSA    Imaging Review Ct Abdomen Pelvis W Wo Contrast  04/11/2015  CLINICAL DATA:  Blood in urine for 2-3 days. Large blood clots in urine. History of DVT. Pulmonary embolism. Coronary artery  disease. CHF. EXAM: CT ABDOMEN AND PELVIS WITHOUT AND WITH CONTRAST TECHNIQUE: Multidetector CT imaging of the abdomen and pelvis was performed following the standard protocol before and following the bolus administration of intravenous contrast. CONTRAST:  134mL OMNIPAQUE IOHEXOL 300 MG/ML  SOLN COMPARISON:  02/15/2011, report only. FINDINGS: Lower chest: Right lung base scarring. Mild cardiomegaly, without pericardial or pleural effusion. Hepatobiliary: Normal liver. Possible tiny gallstone, without acute cholecystitis the biliary duct dilatation. Pancreas: Normal pancreas for age, without duct dilatation or mass. Spleen: Normal in size, without focal abnormality. Adrenals/Urinary Tract: Normal adrenal glands. renal vascular calcifications. No definite renal collecting system calculi. No hydronephrosis. No hydroureter or ureteric calculi. No bladder calculi. No suspicious renal mass on post-contrast imaging. A too small to characterize interpolar right renal lesion is most likely a cyst. Poor to moderate renal collecting system opacification on delayed imaging. Good ureteric opacification, without filling defect. Eccentric right soft tissue density within the urinary bladder measures on the order of 2.7 x 1.6 cm on image 60/series 6. There is precontrast hyperattenuation in this area on series 2. Suboptimal bladder opacification on delayed images secondary to non-opacified urine in the non dependent bladder. Stomach/Bowel: Proximal gastric underdistention. Apparent wall thickening could be secondary, including on image 17/series 6. Extensive colonic diverticulosis. Normal terminal ileum and appendix. Normal small bowel. Vascular/Lymphatic: Advanced aortic and branch vessel atherosclerosis. No abdominopelvic adenopathy. Reproductive: Mild prostatomegaly, including impression of the median lobe into the urinary bladder on image 64/series 6. Bilateral small hydroceles, likely physiologic. Other: Trace cul-de-sac  fluid, including on image 57/series 6. Musculoskeletal: Suspect Paget's disease in the proximal left femur semi- cortical thickening and irregularity. Degenerative partial fusion of the bilateral sacroiliac joints. Other areas of possible Paget's disease within the upper and lower lumbar spine. L4-5 anterolisthesis, grade 1. IMPRESSION: 1. Soft tissue density in the dependent right-sided urinary bladder. Although this is precontrast hyper attenuating, and is at least partially felt to represent blood clot, an enhancing transitional cell carcinoma cannot be excluded. Cystoscopy should be considered. 2. No upper tract explanation for hematuria. Renal vascular calcifications without definite collecting system stones. 3. Possible cholelithiasis. 4. Apparent gastric wall thickening, at least partially secondary to underdistention. Correlate with symptoms to suggest gastric pathology 5. Trace cul-de-sac fluid, nonspecific. 6. Suspicion of Paget's disease. Electronically Signed   By: Abigail Miyamoto M.D.   On: 04/11/2015 20:01   I have personally reviewed and evaluated these images and lab results as part of my medical decision-making.   EKG Interpretation None      MDM   Final diagnoses:  Hematuria  UTI (lower urinary tract infection)    81 year old male with a few days progressively worsening hematuria. Also on Coumadin for DVTs. Urine analysis here has large blood and likely infection. Started Rocephin the CT every 8 for urinary tract malignancy and found to have a soft tissue mass in his bladder. Discussed case with urology who suggested close urologic follow-up for cystoscopy and continuing antibiotics. Also discussed with the patient about talking to primary doctor regarding continuing anticoagulation in light of an active bleed. Discharged home on Keflex.    Merrily Pew, MD 04/12/15 [phone]

## 2015-04-12 LAB — PSA: PSA: 1.08 ng/mL (ref 0.00–4.00)

## 2015-04-17 ENCOUNTER — Other Ambulatory Visit: Payer: Self-pay | Admitting: Family Medicine

## 2015-04-21 ENCOUNTER — Ambulatory Visit (INDEPENDENT_AMBULATORY_CARE_PROVIDER_SITE_OTHER): Payer: Medicare Other | Admitting: Pharmacist

## 2015-04-21 DIAGNOSIS — I482 Chronic atrial fibrillation: Secondary | ICD-10-CM | POA: Diagnosis not present

## 2015-04-21 DIAGNOSIS — I82403 Acute embolism and thrombosis of unspecified deep veins of lower extremity, bilateral: Secondary | ICD-10-CM | POA: Diagnosis not present

## 2015-04-21 DIAGNOSIS — Z86711 Personal history of pulmonary embolism: Secondary | ICD-10-CM

## 2015-04-21 DIAGNOSIS — Z7901 Long term (current) use of anticoagulants: Secondary | ICD-10-CM | POA: Diagnosis not present

## 2015-04-21 LAB — POCT INR: INR: 3.7

## 2015-04-21 NOTE — Progress Notes (Signed)
Patient checks INR at home with Home INR monitoring.  Billing once per month interupertation fee.  Patient diagnosis - h/o bilateral DVT and PE Procedure code is [phone]

## 2015-04-22 ENCOUNTER — Other Ambulatory Visit: Payer: Self-pay | Admitting: Family Medicine

## 2015-04-28 ENCOUNTER — Ambulatory Visit (INDEPENDENT_AMBULATORY_CARE_PROVIDER_SITE_OTHER): Payer: Medicare Other | Admitting: Pharmacist

## 2015-04-28 DIAGNOSIS — I82403 Acute embolism and thrombosis of unspecified deep veins of lower extremity, bilateral: Secondary | ICD-10-CM

## 2015-04-28 DIAGNOSIS — Z86711 Personal history of pulmonary embolism: Secondary | ICD-10-CM

## 2015-04-28 LAB — POCT INR: INR: 2.7

## 2015-05-01 ENCOUNTER — Telehealth: Payer: Self-pay | Admitting: Family Medicine

## 2015-05-01 NOTE — Telephone Encounter (Signed)
Patient aware and verbalizes understanding. 

## 2015-05-01 NOTE — Telephone Encounter (Signed)
Patient is having swelling in his ankle and foot. He wants to know what you recommend.

## 2015-05-01 NOTE — Telephone Encounter (Signed)
Increase the Lasix from 40 mg to 60 mg for 4 days and then go back to 40 mg daily and see if this helps any of the edema----that would be one and a half tablets daily for 4 days and then have a family member call back with the results of of doing this

## 2015-05-05 LAB — POCT INR: INR: 1.3

## 2015-05-06 ENCOUNTER — Ambulatory Visit: Payer: Self-pay | Admitting: Pharmacist Clinician (PhC)/ Clinical Pharmacy Specialist

## 2015-05-06 DIAGNOSIS — Z86711 Personal history of pulmonary embolism: Secondary | ICD-10-CM

## 2015-05-06 DIAGNOSIS — I82403 Acute embolism and thrombosis of unspecified deep veins of lower extremity, bilateral: Secondary | ICD-10-CM

## 2015-05-09 ENCOUNTER — Ambulatory Visit: Payer: Medicare Other | Admitting: Pharmacist

## 2015-05-09 DIAGNOSIS — I82403 Acute embolism and thrombosis of unspecified deep veins of lower extremity, bilateral: Secondary | ICD-10-CM

## 2015-05-09 DIAGNOSIS — Z86711 Personal history of pulmonary embolism: Secondary | ICD-10-CM

## 2015-05-09 LAB — POCT INR: INR: 2.1

## 2015-05-12 ENCOUNTER — Other Ambulatory Visit: Payer: Medicare Other

## 2015-05-12 DIAGNOSIS — R300 Dysuria: Secondary | ICD-10-CM

## 2015-05-14 ENCOUNTER — Telehealth: Payer: Self-pay | Admitting: Family Medicine

## 2015-05-14 DIAGNOSIS — R300 Dysuria: Secondary | ICD-10-CM

## 2015-05-14 LAB — URINE CULTURE

## 2015-05-14 NOTE — Telephone Encounter (Signed)
Patient aware of urine culture results.

## 2015-05-16 ENCOUNTER — Other Ambulatory Visit: Payer: Self-pay | Admitting: Family Medicine

## 2015-05-19 ENCOUNTER — Ambulatory Visit (INDEPENDENT_AMBULATORY_CARE_PROVIDER_SITE_OTHER): Payer: Medicare Other | Admitting: Family

## 2015-05-19 ENCOUNTER — Encounter: Payer: Self-pay | Admitting: Family

## 2015-05-19 VITALS — BP 184/73 | HR 56 | Temp 97.3°F | Ht 67.0 in

## 2015-05-19 DIAGNOSIS — C679 Malignant neoplasm of bladder, unspecified: Secondary | ICD-10-CM | POA: Diagnosis not present

## 2015-05-19 DIAGNOSIS — R319 Hematuria, unspecified: Secondary | ICD-10-CM

## 2015-05-19 DIAGNOSIS — I82403 Acute embolism and thrombosis of unspecified deep veins of lower extremity, bilateral: Secondary | ICD-10-CM

## 2015-05-19 LAB — POCT UA - MICROSCOPIC ONLY
Bacteria, U Microscopic: NEGATIVE
Casts, Ur, LPF, POC: NEGATIVE
Crystals, Ur, HPF, POC: NEGATIVE
Mucus, UA: NEGATIVE
Yeast, UA: NEGATIVE

## 2015-05-19 LAB — POCT URINALYSIS DIPSTICK
Bilirubin, UA: NEGATIVE
Glucose, UA: NEGATIVE
Ketones, UA: NEGATIVE
Leukocytes, UA: NEGATIVE
Nitrite, UA: NEGATIVE
Protein, UA: NEGATIVE
Spec Grav, UA: 1.02
Urobilinogen, UA: NEGATIVE
pH, UA: 5

## 2015-05-19 LAB — POCT HEMOGLOBIN: Hemoglobin: 13 g/dL — AB (ref 14.1–18.1)

## 2015-05-19 LAB — POCT INR: INR: 1.6

## 2015-05-19 NOTE — Patient Instructions (Signed)

## 2015-05-19 NOTE — Progress Notes (Signed)
Subjective:    Patient ID: Scott [name], male    DOB: 04-Sep-1921, 82 y.o.   MRN: [id number]  Hematuria This is a new problem. The current episode started 1 to 4 weeks ago. The problem has been waxing and waning since onset. He reports no clotting in his urine stream. His pain is at a severity of 0/10. He is experiencing no pain. He describes his urine color as dark red. Irritative symptoms include frequency, nocturia and urgency. Obstructive symptoms do not include incomplete emptying or straining. Associated symptoms include flank pain. Pertinent negatives include no abdominal pain, chills, dysuria, fever, nausea or vomiting. There is no history of kidney stones.   *Pt was treated for a UTI on 04/11/15 and given keflex. PT had CT scan that also showed a soft tissue mass.  Pt states he has not seen an Urologists in over a year. Pt is currently on warfarin for a hx of DVT and PE.     Review of Systems  Constitutional: Negative.  Negative for fever and chills.  HENT: Negative.   Respiratory: Negative.   Cardiovascular: Negative.   Gastrointestinal: Negative.  Negative for nausea, vomiting and abdominal pain.  Endocrine: Negative.   Genitourinary: Positive for urgency, frequency, hematuria, flank pain and nocturia. Negative for dysuria and incomplete emptying.  Musculoskeletal: Negative.   Neurological: Negative.   Hematological: Negative.   Psychiatric/Behavioral: Negative.   All other systems reviewed and are negative.      Objective:   Physical Exam  Constitutional: He is oriented to person, place, and time. He appears well-developed and well-nourished. No distress.  HENT:  Head: Normocephalic.  Eyes: Pupils are equal, round, and reactive to light. Right eye exhibits no discharge. Left eye exhibits no discharge.  Neck: Normal range of motion. Neck supple. No thyromegaly present.  Cardiovascular: Normal rate, regular rhythm, normal heart sounds and intact distal pulses.   No murmur  heard. Pulmonary/Chest: Effort normal and breath sounds normal. No respiratory distress. He has no wheezes.  Abdominal: Soft. Bowel sounds are normal. He exhibits no distension. There is no tenderness.  Musculoskeletal: Normal range of motion. He exhibits no edema or tenderness.  Neurological: He is alert and oriented to person, place, and time. He has normal reflexes. No cranial nerve deficit.  Skin: Skin is warm and dry. No rash noted. No erythema.  Psychiatric: He has a normal mood and affect. His behavior is normal. Judgment and thought content normal.  Vitals reviewed.   BP 184/73 mmHg  Pulse 56  Temp(Src) 97.3 F (36.3 C) (Oral)  Ht 5\' 7"  (1.702 m)  Wt   Results for orders placed or performed in visit on 05/19/15  POCT urinalysis dipstick  Result Value Ref Range   Color, UA YELLOW    Clarity, UA CLEAR    Glucose, UA NEG    Bilirubin, UA NEG    Ketones, UA NEG    Spec Grav, UA 1.020    Blood, UA MOD    pH, UA 5.0    Protein, UA NEG    Urobilinogen, UA negative    Nitrite, UA NEG    Leukocytes, UA Negative Negative  POCT UA - Microscopic Only  Result Value Ref Range   WBC, Ur, HPF, POC 1-5    RBC, urine, microscopic 5-10    Bacteria, U Microscopic NEG    Mucus, UA NEG    Epithelial cells, urine per micros OCC    Crystals, Ur, HPF, POC NEG  Casts, Ur, LPF, POC NEG    Yeast, UA NEG         Assessment & Plan:  1. Hematuria - POCT urinalysis dipstick - POCT UA - Microscopic Only - CBC with Differential/Platelet - Ambulatory referral to Urology - POCT INR - POCT hemoglobin  2. Malignant neoplasm of urinary bladder, unspecified site ([place]) - CBC with Differential/Platelet - Ambulatory referral to Urology  3. Deep vein thrombosis (DVT) of both lower extremities, unspecified chronicity, unspecified vein (HCC) - CBC with Differential/Platelet - Ambulatory referral to Urology  Discussed with clinical pharmacist about continue warfarin dosing- PT has had  multiple DVT's and PE in past- Will continue warfarin at this time We will refer to his urologists and decide about the tumor in bladder  INR and Hgb pending No infection present in urine Pt needs to keep chronic follow up appts with PCP and specialists   Evelina Dun, FNP

## 2015-05-20 LAB — CBC WITH DIFFERENTIAL/PLATELET
Basophils Absolute: 0 10*3/uL (ref 0.0–0.2)
Basos: 0 %
EOS (ABSOLUTE): 0.1 10*3/uL (ref 0.0–0.4)
Eos: 2 %
Hematocrit: 36.5 % — ABNORMAL LOW (ref 37.5–51.0)
Hemoglobin: 12.2 g/dL — ABNORMAL LOW (ref 12.6–17.7)
Immature Grans (Abs): 0 10*3/uL (ref 0.0–0.1)
Immature Granulocytes: 0 %
Lymphocytes Absolute: 1 10*3/uL (ref 0.7–3.1)
Lymphs: 31 %
MCH: 26.6 pg (ref 26.6–33.0)
MCHC: 33.4 g/dL (ref 31.5–35.7)
MCV: 80 fL (ref 79–97)
Monocytes Absolute: 0.4 10*3/uL (ref 0.1–0.9)
Monocytes: 12 %
Neutrophils Absolute: 1.8 10*3/uL (ref 1.4–7.0)
Neutrophils: 55 %
Platelets: 193 10*3/uL (ref 150–379)
RBC: 4.58 x10E6/uL (ref 4.14–5.80)
RDW: 18.1 % — ABNORMAL HIGH (ref 12.3–15.4)
WBC: 3.3 10*3/uL — ABNORMAL LOW (ref 3.4–10.8)

## 2015-05-20 NOTE — Addendum Note (Signed)
Addended by: Evelina Dun A on: 05/20/2015 08:55 AM   Modules accepted: SmartSet

## 2015-05-24 LAB — POCT INR: INR: 2.1

## 2015-05-26 ENCOUNTER — Ambulatory Visit (INDEPENDENT_AMBULATORY_CARE_PROVIDER_SITE_OTHER): Payer: Medicare Other | Admitting: Pharmacist

## 2015-05-26 DIAGNOSIS — I82403 Acute embolism and thrombosis of unspecified deep veins of lower extremity, bilateral: Secondary | ICD-10-CM | POA: Diagnosis not present

## 2015-05-26 DIAGNOSIS — I4891 Unspecified atrial fibrillation: Secondary | ICD-10-CM | POA: Diagnosis not present

## 2015-05-26 DIAGNOSIS — Z86711 Personal history of pulmonary embolism: Secondary | ICD-10-CM

## 2015-05-26 NOTE — Progress Notes (Signed)
Patient checks INR at home with Home INR monitoring.  Billing once per month interupertation fee.  Patient diagnosis - venous thromboembolism and atrial fibrillation Procedure code if [id number]

## 2015-05-27 ENCOUNTER — Other Ambulatory Visit: Payer: Self-pay | Admitting: Family Medicine

## 2015-06-02 ENCOUNTER — Telehealth: Payer: Self-pay | Admitting: Family Medicine

## 2015-06-02 NOTE — Telephone Encounter (Signed)
Patient accidentally threw away chip for new testing strip for coagucheck (used to check INR at home).  They are sending out more strips and patient to check as soon as he gets them.  If he doesn't received by Wed 06/04/15 then I recommend he come in to have INR checked here.  Patient understands and will call for appt if needed.

## 2015-06-05 ENCOUNTER — Ambulatory Visit (INDEPENDENT_AMBULATORY_CARE_PROVIDER_SITE_OTHER): Payer: Self-pay | Admitting: Pharmacist

## 2015-06-05 DIAGNOSIS — Z86711 Personal history of pulmonary embolism: Secondary | ICD-10-CM

## 2015-06-05 DIAGNOSIS — Z7901 Long term (current) use of anticoagulants: Secondary | ICD-10-CM | POA: Diagnosis not present

## 2015-06-05 DIAGNOSIS — I482 Chronic atrial fibrillation: Secondary | ICD-10-CM | POA: Diagnosis not present

## 2015-06-05 LAB — POCT INR: INR: 2.5

## 2015-06-05 NOTE — Progress Notes (Signed)
No charge for labs or visit - INR check through home monitoring system  

## 2015-06-06 ENCOUNTER — Telehealth: Payer: Self-pay | Admitting: Family Medicine

## 2015-06-06 ENCOUNTER — Ambulatory Visit (INDEPENDENT_AMBULATORY_CARE_PROVIDER_SITE_OTHER): Payer: Medicare Other | Admitting: Urology

## 2015-06-06 DIAGNOSIS — C670 Malignant neoplasm of trigone of bladder: Secondary | ICD-10-CM

## 2015-06-06 DIAGNOSIS — N401 Enlarged prostate with lower urinary tract symptoms: Secondary | ICD-10-CM | POA: Diagnosis not present

## 2015-06-06 DIAGNOSIS — R310 Gross hematuria: Secondary | ICD-10-CM | POA: Diagnosis not present

## 2015-06-06 DIAGNOSIS — I493 Ventricular premature depolarization: Secondary | ICD-10-CM

## 2015-06-06 DIAGNOSIS — Z01810 Encounter for preprocedural cardiovascular examination: Secondary | ICD-10-CM

## 2015-06-06 DIAGNOSIS — R000 Tachycardia, unspecified: Secondary | ICD-10-CM

## 2015-06-06 DIAGNOSIS — I214 Non-ST elevation (NSTEMI) myocardial infarction: Secondary | ICD-10-CM

## 2015-06-06 NOTE — Telephone Encounter (Signed)
Please get cardiac clearance for this patient from Dr. Jenkins Rouge . He will need to have an appointment with Dr. Ardis Rowan in.

## 2015-06-09 ENCOUNTER — Telehealth: Payer: Self-pay | Admitting: Cardiology

## 2015-06-09 NOTE — Telephone Encounter (Signed)
Returned call to [facility] with Dr.Wrenn's office, left message on personal voice mail last office visit with Dr.Hochrein 01/2014.Appointment scheduled with Dr.Hochrein 06/26/2015 at 11:45 am at [facility] office.Patient was notified of appointment.

## 2015-06-09 NOTE — Telephone Encounter (Signed)
Patient aware and referral placed for surgical clearance.

## 2015-06-09 NOTE — Telephone Encounter (Signed)
Pam at [facility] urology aware that cardiac clearance will have to come through cardiology. Lm for patient to call us back about setting up a referral for cardiology for cardiac clearance

## 2015-06-09 NOTE — Telephone Encounter (Signed)
Request for surgical clearance:  1. What type of surgery is being performed? Bladder Tumor removed   2. When is this surgery scheduled? Have not scheduled yet   3. Are there any medications that need to be held prior to surgery and how long? Warfarin and aspirin-when to stop and how long?   4. Name of physician performing surgery? Dr [name]   5. What is your office phone and fax number? [phone] and Fax#-[phone]  6.

## 2015-06-12 ENCOUNTER — Other Ambulatory Visit: Payer: Self-pay | Admitting: Family Medicine

## 2015-06-16 ENCOUNTER — Ambulatory Visit (INDEPENDENT_AMBULATORY_CARE_PROVIDER_SITE_OTHER): Payer: Medicare Other | Admitting: Pharmacist

## 2015-06-16 DIAGNOSIS — I82403 Acute embolism and thrombosis of unspecified deep veins of lower extremity, bilateral: Secondary | ICD-10-CM

## 2015-06-16 DIAGNOSIS — Z86711 Personal history of pulmonary embolism: Secondary | ICD-10-CM | POA: Diagnosis not present

## 2015-06-16 LAB — PROTIME-INR

## 2015-06-16 LAB — POCT INR: INR: 1.9

## 2015-06-16 NOTE — Progress Notes (Signed)
Patient checks INR at home with Home INR monitoring.  Billing once per month interupertation fee.  Patient diagnosis - venous thromboembolism.  Procedure code if [id number]

## 2015-06-18 ENCOUNTER — Other Ambulatory Visit: Payer: Self-pay | Admitting: Family Medicine

## 2015-06-24 ENCOUNTER — Other Ambulatory Visit: Payer: Self-pay | Admitting: Family Medicine

## 2015-06-25 NOTE — Progress Notes (Signed)
=    HPI Scott [name] is a 82 y.o.male with NICM (06/2012 cath with patent cors - EF 40%,  2014 20-25%, 45% 2016), HTN, chronic LBBB, postural syncope related to diuretics, EtOH abuse, and DVT.   He is being evaluated for "scraping" of a bladder tumor.  I have not seen him since 2015.  However, he was seen by our group last year.  He was in the hospital with a nonhealing foot ulcer.  In July of last year he did have a right AKA.  At that time we did see him in follow up.  He has a run of NSVT in the hospital.  However, his EF was improved from his previous as above.  No further cardiac work up was suggested.  He has being doing relatively well from a cardiac standpoint.  He walks with a prosthesis.  He denies any cardiovascular symptoms.  The patient denies any new symptoms such as chest discomfort, neck or arm discomfort. There has been no new shortness of breath, PND or orthopnea. There have been no reported palpitations, presyncope or syncope.  Allergies  Allergen Reactions  . Lipitor [Atorvastatin] Other (See Comments)    unknown    Current Outpatient Prescriptions  Medication Sig Dispense Refill  . alum & mag hydroxide-simeth (MAALOX/MYLANTA) 200-200-20 MG/5ML suspension Take 30 mLs by mouth every 6 (six) hours as needed for indigestion or heartburn (dyspepsia). 355 mL 0  . donepezil (ARICEPT) 10 MG tablet TAKE ONE TABLET AT BEDTIME 30 tablet 4  . ferrous sulfate 325 (65 FE) MG tablet Take 1 tablet (325 mg total) by mouth daily with breakfast. 30 tablet 4  . fluticasone (FLONASE) 50 MCG/ACT nasal spray Place 2 sprays into both nostrils daily. (Patient taking differently: Place 2 sprays into both nostrils daily as needed. ) 16 g 6  . folic acid (FOLVITE) 1 MG tablet Take 1 tablet (1 mg total) by mouth daily. 30 tablet 4  . furosemide (LASIX) 40 MG tablet Take 40 mg by mouth every morning.     . gabapentin (NEURONTIN) 100 MG capsule TAKE (2) CAPSULES TWICE DAILY. 120 capsule 2  . hydrocerin  (EUCERIN) CREA Apply 1 application topically 2 (two) times daily.  0  . isosorbide dinitrate (ISORDIL) 30 MG tablet     . metoprolol succinate (TOPROL-XL) 50 MG 24 hr tablet TAKE 1 TABLET DAILY 30 tablet 2  . nitroGLYCERIN (NITROSTAT) 0.4 MG SL tablet Place 1 tablet (0.4 mg total) under the tongue every 5 (five) minutes as needed for chest pain (up to 3 doses). 25 tablet 3  . sertraline (ZOLOFT) 50 MG tablet Take 50 mg by mouth every morning.    . simvastatin (ZOCOR) 10 MG tablet TAKE 1 TABLET DAILY 30 tablet 0  . tamsulosin (FLOMAX) 0.4 MG CAPS capsule TAKE (1) CAPSULE DAILY 30 capsule 0  . ULORIC 40 MG tablet TAKE 1 TABLET DAILY 30 tablet 6  . warfarin (COUMADIN) 2 MG tablet TAKE 1 TABLET ALONG WITH 2.5MG  TO EQUAL 4.5 MG DAILY 30 tablet 2  . warfarin (COUMADIN) 2.5 MG tablet TAKE UP TO 3 TABLETS DAILY AS DIRECTED BY CPP 90 tablet 2   No current facility-administered medications for this visit.    Past Medical History  Diagnosis Date  . HTN (hypertension)   . Hyperlipidemia   . PVCs (premature ventricular contractions)   . Glaucoma   . PVD (peripheral vascular disease) (HCC)     Right ABI .75, Left .78 (2006)  . Popliteal  artery aneurysm, bilateral ([place]) DECUMENTED CHRONIC PARTIAL OCCLUSION--  PT DENIES CLAUDICATION OR ANY OTHER SYMPTOMS  . Frequency of urination   . Nocturia   . DVT, bilateral lower limbs ([place]) 03/2013    a. Bilat DVT dx 03/2013.  . Pulmonary embolism ([place])     a. By CT angio 04/2013.  Marland Kitchen Elevated troponin     a. Adm 12/30-04/2013 - not felt to represent ACS; ?due to PE. b. Patent cors 06/2012.  Marland Kitchen NSVT (nonsustained ventricular tachycardia) ([place])     a. NSVT 06/2012; NSVT also seen during 03/2013 adm. b. Med rx. Not candidate for ICD given adv age.  Marland Kitchen ETOH abuse   . Syncope     a. Felt to be postural syncope related to diuretics 06/2012.  Marland Kitchen Heart murmur   . DVT (deep venous thrombosis) ([place])     "I think in both legs"  . Pneumonia     "couple times"  . DDD  (degenerative disc disease)   . Arthritis     "all over"  . Depression   . CAD- non obstructive disease by cath 3/14   . PAD (peripheral artery disease) ([place])   . Gangrene of toe ([place]) 10/03/2014  . Sepsis due to other etiology ([place]) 10/03/2014  . Chronic systolic heart failure ([place]) 10/04/14    EF improved 50-55%  . Chronic systolic CHF (congestive heart failure) ([place])     a. NICM - patent cors 06/2012, EF 40% at that time. b. 03/2013 eval: EF 20-25%.  . Chronic lower back pain   . Bladder cancer ([place]) 03/15/2012    pt denies this hx on 10/15/2014    ROS:  As stated in the HPI and negative for all other systems.   PHYSICAL EXAM BP 130/70 mmHg  Pulse 61  Ht 5\' 7"  (1.702 m)  Wt 152 lb 11.2 oz (69.264 kg)  BMI 23.91 kg/m2 GEN:  No distress NECK:  No jugular venous distention at 90 degrees, waveform within normal limits, carotid upstroke brisk and symmetric, no bruits, no thyromegaly LYMPHATICS:  No cervical adenopathy LUNGS:  Clear to auscultation bilaterally BACK:  No CVA tenderness CHEST:  Unremarkable HEART:  S1 and S2 within normal limits, no S3, no S4, no clicks, no rubs, no murmurs ABD:  Positive bowel sounds normal in frequency in pitch, no bruits, no rebound, no guarding, unable to assess midline mass or bruit with the patient seated. EXT:  2 plus pulses throughout, moderate edema, no cyanosis no clubbing, status post right AKA SKIN:  No rashes no nodules NEURO:  Cranial nerves II through XII grossly intact, motor grossly intact throughout PSYCH:  Cognitively intact, oriented to person place and time  EKG:  Sinus rhythm, rate 61, IVCD , left axis deviation. PVC  06/26/2015  ASSESSMENT AND PLAN  Diastolic heart failure - He seems to be euvolemic. No change in therapy is indicated.    PVCs (premature ventricular contractions) -  He is having no symptoms related to this.  No change in therapy is indicated.    HTN (hypertension) -  The blood pressure is at target. No  change in medications is indicated. We will continue with therapeutic lifestyle changes (TLC).  DVT/PULMONARY EMBOLI-  INR is followed closely by Dr. Laurance Flatten.    He can hold his warfarin as needed for the surgery.  No bridging is indicated.    ATRIAL FIB - Maintaining NSR.  Continue rate control and anticoagulation.    Mr. ZIVEN BENKO has a  CHA2DS2 - VASc score of 4 with a risk of stroke of 4%.  PREOP - The patient has no high risk findings.  No further work up.  Therefore, based on ACC/AHA guidelines, the patient would be at acceptable risk for the planned procedure without further cardiovascular testing.

## 2015-06-26 ENCOUNTER — Ambulatory Visit: Payer: Medicare Other | Admitting: Cardiology

## 2015-06-26 ENCOUNTER — Ambulatory Visit (INDEPENDENT_AMBULATORY_CARE_PROVIDER_SITE_OTHER): Payer: Medicare Other | Admitting: Cardiology

## 2015-06-26 ENCOUNTER — Encounter: Payer: Self-pay | Admitting: Cardiology

## 2015-06-26 VITALS — BP 130/70 | HR 61 | Ht 67.0 in | Wt 152.7 lb

## 2015-06-26 DIAGNOSIS — I420 Dilated cardiomyopathy: Secondary | ICD-10-CM

## 2015-06-26 LAB — POCT INR: INR: 2.6

## 2015-06-26 NOTE — Patient Instructions (Signed)
Your physician wants you to follow-up in: 18 Months in [place]. You will receive a reminder letter in the mail two months in advance. If you don't receive a letter, please call our office to schedule the follow-up appointment.

## 2015-06-27 ENCOUNTER — Ambulatory Visit (INDEPENDENT_AMBULATORY_CARE_PROVIDER_SITE_OTHER): Payer: Self-pay | Admitting: Pharmacist

## 2015-06-27 DIAGNOSIS — I82403 Acute embolism and thrombosis of unspecified deep veins of lower extremity, bilateral: Secondary | ICD-10-CM

## 2015-06-27 DIAGNOSIS — Z86711 Personal history of pulmonary embolism: Secondary | ICD-10-CM

## 2015-06-27 NOTE — Progress Notes (Signed)
No charge for labs or visit - INR check through home monitoring system  

## 2015-06-30 ENCOUNTER — Telehealth: Payer: Self-pay | Admitting: Family Medicine

## 2015-06-30 ENCOUNTER — Other Ambulatory Visit: Payer: Self-pay | Admitting: Family Medicine

## 2015-06-30 DIAGNOSIS — H9190 Unspecified hearing loss, unspecified ear: Secondary | ICD-10-CM

## 2015-06-30 NOTE — Telephone Encounter (Signed)
Please address

## 2015-07-01 ENCOUNTER — Other Ambulatory Visit: Payer: Self-pay | Admitting: Urology

## 2015-07-01 ENCOUNTER — Telehealth: Payer: Self-pay

## 2015-07-01 NOTE — Telephone Encounter (Signed)
Office visit note from patient's visit with Dr. Percival Spanish was faxed to Dr. Jeffie Pollock to advise him of surgical clearance per Dr. Percival Spanish

## 2015-07-03 DIAGNOSIS — H903 Sensorineural hearing loss, bilateral: Secondary | ICD-10-CM | POA: Diagnosis not present

## 2015-07-03 LAB — POCT INR: INR: 2.5

## 2015-07-04 ENCOUNTER — Ambulatory Visit (INDEPENDENT_AMBULATORY_CARE_PROVIDER_SITE_OTHER): Payer: Self-pay | Admitting: Pharmacist

## 2015-07-04 ENCOUNTER — Encounter (HOSPITAL_COMMUNITY): Payer: Self-pay | Admitting: *Deleted

## 2015-07-04 DIAGNOSIS — I82403 Acute embolism and thrombosis of unspecified deep veins of lower extremity, bilateral: Secondary | ICD-10-CM

## 2015-07-04 DIAGNOSIS — Z86711 Personal history of pulmonary embolism: Secondary | ICD-10-CM

## 2015-07-07 ENCOUNTER — Other Ambulatory Visit: Payer: Self-pay | Admitting: Family Medicine

## 2015-07-08 ENCOUNTER — Other Ambulatory Visit: Payer: Self-pay | Admitting: *Deleted

## 2015-07-08 MED ORDER — TAMSULOSIN HCL 0.4 MG PO CAPS: ORAL_CAPSULE | ORAL | Status: DC

## 2015-07-08 MED ORDER — SIMVASTATIN 10 MG PO TABS: 10.0000 mg | ORAL_TABLET | Freq: Every day | ORAL | Status: DC

## 2015-07-08 NOTE — Telephone Encounter (Signed)
Please send request to his PCP 

## 2015-07-08 NOTE — Telephone Encounter (Signed)
Last seen 05/19/15  Scott [name]  [facility] PCP  Last lipid 07/02/14

## 2015-07-09 NOTE — H&P (Signed)
Chief Complaint  Bloody urine.   Active Problems  1. Benign prostatic hyperplasia with urinary obstruction (N40.1,N13.8)  2. Chronic systolic congestive heart failure (I50.22)  3. Deep vein thrombosis of distal lower extremity (I82.4Z9)  4. History of bladder cancer (Z85.51)  5. Urinary urgency (R39.15)  History of Present Illness      [name] returns today in f/u.  He has a history of bladder cancer resected in 6/13 but is overdue for f/u with his last cystoscopy in 2015.   He had the onset of gross hematuria about 6 weeks ago and was treated for a possible UTI and received antibiotics with clearing of the urine.  He had a CT on 04/11/15 that showed a 2.7cm right bladder mass that is worrisome for a tumor.   The bleeding came back but it was milder and resolved spontaneously.  He has had urgency and frequency and incontinence.  he has nocturia 2-3x.    He originally had LG NMIBC on path and had a negative repeat biopsy in 12/13. He had been on oxybutynin and tamsulosin but that was stopped for CHF but his EF is up to 50% from 20%.  His fluid intake is not excessive.  He has no associated signs or symptoms.   Past Medical History  1. History of Arthritis  2. History of Gout (M10.9)  3. H/O blood clots (Z86.718)  4. History of Heart Block  5. History of bladder cancer (Z85.51)  6. History of congestive heart disease (Z86.79)  7. History of glaucoma (Z86.69)  8. History of hypercholesterolemia (Z86.39)  9. History of hypertension (Z86.79)  Surgical History  1. History of Amputation Of Leg Above Knee  2. History of Cystoscopy With Biopsy  3. History of Cystoscopy With Biopsy  4. History of Cystoscopy With Fulguration Medium Lesion (2-5cm)  5. History of No Surgical Problems  6. History of Transurethral Resection Of Prostate (TURP)  Current Meds  1. AmLODIPine Besylate 10 MG Oral Tablet;  Therapy: 12Sep2013 to Recorded  2. Aspirin 81 MG Oral Tablet Chewable;  Therapy:  (Recorded:21Nov2014) to Recorded  3. Benicar HCT 20-12.5 MG Oral Tablet;  Therapy: (Recorded:21Nov2014) to Recorded  4. Donepezil HCl - 10 MG Oral Tablet Dispersible;  Therapy: (Recorded:21Nov2014) to Recorded  5. Furosemide 40 MG Oral Tablet;  Therapy: [id number] to Recorded  6. Hydrocodone-Acetaminophen 10-325 MG Oral Tablet;  Therapy: (Recorded:21Nov2014) to Recorded  7. Klor-Con 10 10 MEQ Oral Tablet Extended Release;  Therapy: (Recorded:21Nov2014) to Recorded  8. Lumigan 0.01 % Ophthalmic Solution;  Therapy: (Recorded:21Nov2014) to Recorded  9. Lumigan 0.01 % Ophthalmic Solution;  Therapy: 31Aug2012 to Recorded  10. Metoprolol Succinate ER 100 MG Oral Tablet Extended Release 24 Hour;   Therapy: [id number] to Recorded  11. Simvastatin 10 MG Oral Tablet;   Therapy: (Recorded:21Nov2014) to Recorded  12. Warfarin Sodium 5 MG Oral Tablet;   Therapy: [id number] to Recorded  Allergies  1. No Known Drug Allergies  Family History  1. Family history of Death In The Family Father  2. Family history of Death In The Family Mother : Mother  54. Family history of Family Health Status Number Of Children  Social History  1. Denied: History of Alcohol Use (History)  2. Caffeine Use   1 per day  3. Former smoker [phone])   smoked 1/2 for 68yrs quit 52yrs ago  4. Marital History - Currently Married   He has cut back the alcohol.   Review of Systems Genitourinary, constitutional, skin, eye, otolaryngeal, hematologic/lymphatic,  cardiovascular, pulmonary, endocrine, musculoskeletal, gastrointestinal, neurological and psychiatric system(s) were reviewed and pertinent findings if present are noted and are otherwise negative.  Genitourinary: urinary frequency, feelings of urinary urgency, nocturia and incontinence.  Gastrointestinal: no diarrhea and no constipation.  Constitutional: no fever and no recent weight loss.  Hematologic/Lymphatic: no swollen glands.  Cardiovascular: leg swelling,  but no chest pain.    Vitals Vital Signs [Data Includes: Last 1 Day]  Recorded: 24Feb2017 10:31AM  Blood Pressure: 97 / 61 Temperature: 98.3 F Heart Rate: 118  Physical Exam Constitutional: Well nourished and well developed . No acute distress.  Pulmonary: No respiratory distress and normal respiratory rhythm and effort.  Cardiovascular: Heart rate and rhythm are normal . No peripheral edema.    Results/Data Urine [Data Includes: Last 1 Day]   [id number]  COLOR YELLOW   APPEARANCE CLEAR   SPECIFIC GRAVITY 1.015   pH 7.5   GLUCOSE NEGATIVE   BILIRUBIN NEGATIVE   KETONE NEGATIVE   BLOOD TRACE   PROTEIN TRACE   NITRITE NEGATIVE   LEUKOCYTE ESTERASE NEGATIVE   SQUAMOUS EPITHELIAL/HPF 0-5 HPF  WBC 0-5 WBC/HPF  RBC 3-10 RBC/HPF  BACTERIA NONE SEEN HPF  CRYSTALS NONE SEEN HPF  CASTS NONE SEEN LPF  Yeast NONE SEEN HPF   Old records or history reviewed: I have reviewed records and labs from Dr. Laurance Flatten. His culture in [place] grew Mx Species.  The following images/tracing/specimen were independently visualized:  I have reviewed his CT films and report.    Procedure  Procedure: Cystoscopy   Indication: Hematuria. History of Urothelial Carcinoma.  Informed Consent: Risks, benefits, and potential adverse events were discussed and informed consent was obtained from the patient.  Prep: The patient was prepped with betadine.  Antibiotic prophylaxis: Ciprofloxacin.  Procedure Note:  Urethral meatus:. No abnormalities.  Anterior urethra: No abnormalities.  Prostatic urethra:. Estimated length was 3 cm. There was visual obstruction of the prostatic urethra. With a prior TURP but some apical regrowth.  Bladder: Visulization was clear. The ureteral orifices were in the normal anatomic position bilaterally and had clear efflux of urine. The mucosa was smooth without abnormalities. Examination of the bladder demonstrated moderate trabeculation. A solitary tumor was visualized in the  bladder. A papillary tumor was seen in the bladder measuring approximately 2.7 cm in size. This tumor was located on the right side, near the trigone of the bladder. The patient tolerated the procedure well.  Complications: None.    Assessment  1. 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 Gross hematuria (R31.0)   He has a recurrent tumor lateral to the right UO.   Plan Benign prostatic hyperplasia with urinary obstruction   1. UA With REFLEX; [Do Not Release]; Status:Hold For - [employer];  Requested for:21Feb2017;  Malignant neoplasm of trigone of bladder   2. Cysto; Status:Hold For - Appointment,Date of Service; Requested for:24Feb2017;       He needs TURBT and will need to be off of the warfarin for the procedure and a week after.  he will need mitomycin c as weel.  I have reviewed the risks of bleeding, infection, bladder and ureteral injury, need for secondary procedures, chemical cystitis, thrombotic events and anesthetic risks.   Discussion/Summary   CC: Dr. Morrie Sheldon and Dr. Walker Kehr.

## 2015-07-10 ENCOUNTER — Ambulatory Visit (HOSPITAL_COMMUNITY): Payer: Medicare Other | Admitting: Anesthesiology

## 2015-07-10 ENCOUNTER — Encounter (HOSPITAL_COMMUNITY): Payer: Self-pay | Admitting: *Deleted

## 2015-07-10 ENCOUNTER — Encounter (HOSPITAL_COMMUNITY)
Admission: RE | Disposition: A | Discharge status: Home / Self Care | Payer: Self-pay | Source: Ambulatory Visit | Attending: Urology

## 2015-07-10 ENCOUNTER — Observation Stay (HOSPITAL_COMMUNITY)
Admission: RE | Admit: 2015-07-10 | Discharge: 2015-07-11 | Disposition: A | Discharge status: Home / Self Care | Payer: Medicare Other | Source: Ambulatory Visit | Attending: Urology | Admitting: Urology

## 2015-07-10 DIAGNOSIS — I4891 Unspecified atrial fibrillation: Secondary | ICD-10-CM | POA: Insufficient documentation

## 2015-07-10 DIAGNOSIS — I110 Hypertensive heart disease with heart failure: Secondary | ICD-10-CM | POA: Insufficient documentation

## 2015-07-10 DIAGNOSIS — I471 Supraventricular tachycardia: Secondary | ICD-10-CM | POA: Insufficient documentation

## 2015-07-10 DIAGNOSIS — I2510 Atherosclerotic heart disease of native coronary artery without angina pectoris: Secondary | ICD-10-CM | POA: Insufficient documentation

## 2015-07-10 DIAGNOSIS — I739 Peripheral vascular disease, unspecified: Secondary | ICD-10-CM | POA: Insufficient documentation

## 2015-07-10 DIAGNOSIS — Z89619 Acquired absence of unspecified leg above knee: Secondary | ICD-10-CM | POA: Insufficient documentation

## 2015-07-10 DIAGNOSIS — N138 Other obstructive and reflux uropathy: Secondary | ICD-10-CM | POA: Diagnosis not present

## 2015-07-10 DIAGNOSIS — N329 Bladder disorder, unspecified: Secondary | ICD-10-CM | POA: Diagnosis present

## 2015-07-10 DIAGNOSIS — Z86718 Personal history of other venous thrombosis and embolism: Secondary | ICD-10-CM | POA: Diagnosis not present

## 2015-07-10 DIAGNOSIS — E7800 Pure hypercholesterolemia, unspecified: Secondary | ICD-10-CM | POA: Diagnosis not present

## 2015-07-10 DIAGNOSIS — C672 Malignant neoplasm of lateral wall of bladder: Principal | ICD-10-CM | POA: Diagnosis present

## 2015-07-10 DIAGNOSIS — M109 Gout, unspecified: Secondary | ICD-10-CM | POA: Diagnosis not present

## 2015-07-10 DIAGNOSIS — I10 Essential (primary) hypertension: Secondary | ICD-10-CM | POA: Diagnosis not present

## 2015-07-10 DIAGNOSIS — I5022 Chronic systolic (congestive) heart failure: Secondary | ICD-10-CM | POA: Insufficient documentation

## 2015-07-10 DIAGNOSIS — N401 Enlarged prostate with lower urinary tract symptoms: Secondary | ICD-10-CM | POA: Insufficient documentation

## 2015-07-10 DIAGNOSIS — H409 Unspecified glaucoma: Secondary | ICD-10-CM | POA: Diagnosis not present

## 2015-07-10 DIAGNOSIS — Z7982 Long term (current) use of aspirin: Secondary | ICD-10-CM | POA: Insufficient documentation

## 2015-07-10 DIAGNOSIS — Z7901 Long term (current) use of anticoagulants: Secondary | ICD-10-CM | POA: Diagnosis not present

## 2015-07-10 DIAGNOSIS — K219 Gastro-esophageal reflux disease without esophagitis: Secondary | ICD-10-CM | POA: Insufficient documentation

## 2015-07-10 DIAGNOSIS — I129 Hypertensive chronic kidney disease with stage 1 through stage 4 chronic kidney disease, or unspecified chronic kidney disease: Secondary | ICD-10-CM | POA: Diagnosis not present

## 2015-07-10 DIAGNOSIS — Z79899 Other long term (current) drug therapy: Secondary | ICD-10-CM | POA: Diagnosis not present

## 2015-07-10 DIAGNOSIS — Z87891 Personal history of nicotine dependence: Secondary | ICD-10-CM | POA: Insufficient documentation

## 2015-07-10 DIAGNOSIS — R3915 Urgency of urination: Secondary | ICD-10-CM | POA: Diagnosis not present

## 2015-07-10 DIAGNOSIS — C679 Malignant neoplasm of bladder, unspecified: Secondary | ICD-10-CM | POA: Diagnosis not present

## 2015-07-10 DIAGNOSIS — N189 Chronic kidney disease, unspecified: Secondary | ICD-10-CM | POA: Diagnosis not present

## 2015-07-10 HISTORY — PX: TRANSURETHRAL RESECTION OF BLADDER TUMOR: SHX2575

## 2015-07-10 LAB — CBC
HCT: 38.5 % — ABNORMAL LOW (ref 39.0–52.0)
Hemoglobin: 12.9 g/dL — ABNORMAL LOW (ref 13.0–17.0)
MCH: 27 pg (ref 26.0–34.0)
MCHC: 33.5 g/dL (ref 30.0–36.0)
MCV: 80.7 fL (ref 78.0–100.0)
Platelets: 153 10*3/uL (ref 150–400)
RBC: 4.77 MIL/uL (ref 4.22–5.81)
RDW: 15.9 % — ABNORMAL HIGH (ref 11.5–15.5)
WBC: 4.3 10*3/uL (ref 4.0–10.5)

## 2015-07-10 LAB — BASIC METABOLIC PANEL
Anion gap: 8 (ref 5–15)
BUN: 28 mg/dL — ABNORMAL HIGH (ref 6–20)
CO2: 28 mmol/L (ref 22–32)
Calcium: 9.7 mg/dL (ref 8.9–10.3)
Chloride: 105 mmol/L (ref 101–111)
Creatinine, Ser: 1.2 mg/dL (ref 0.61–1.24)
GFR calc Af Amer: 59 mL/min — ABNORMAL LOW (ref >60)
GFR calc non Af Amer: 51 mL/min — ABNORMAL LOW (ref >60)
Glucose, Bld: 88 mg/dL (ref 65–99)
Potassium: 4.1 mmol/L (ref 3.5–5.1)
Sodium: 141 mmol/L (ref 135–145)

## 2015-07-10 LAB — SURGICAL PCR SCREEN
MRSA, PCR: POSITIVE — AB
Staphylococcus aureus: POSITIVE — AB

## 2015-07-10 LAB — PROTIME-INR
INR: 1.15 (ref 0.00–1.49)
Prothrombin Time: 14.9 seconds (ref 11.6–15.2)

## 2015-07-10 SURGERY — TURBT (TRANSURETHRAL RESECTION OF BLADDER TUMOR)
Anesthesia: General

## 2015-07-10 MED ORDER — BISACODYL 10 MG RE SUPP: 10.0000 mg | Freq: Every day | RECTAL | Status: DC | PRN

## 2015-07-10 MED ORDER — GABAPENTIN 100 MG PO CAPS
200.0000 mg | ORAL_CAPSULE | Freq: Two times a day (BID) | ORAL | Status: DC
Start: 2015-07-10 — End: 2015-07-11
  Administered 2015-07-10 – 2015-07-11 (×2): 200 mg via ORAL
  Filled 2015-07-10 (×2): qty 2

## 2015-07-10 MED ORDER — ROCURONIUM BROMIDE 100 MG/10ML IV SOLN
INTRAVENOUS | Status: AC
  Filled 2015-07-10: qty 1

## 2015-07-10 MED ORDER — EPHEDRINE SULFATE 50 MG/ML IJ SOLN
INTRAMUSCULAR | Status: DC | PRN
  Administered 2015-07-10 (×2): 5 mg via INTRAVENOUS

## 2015-07-10 MED ORDER — FEBUXOSTAT 40 MG PO TABS
40.0000 mg | ORAL_TABLET | Freq: Every day | ORAL | Status: DC
  Administered 2015-07-10 – 2015-07-11 (×2): 40 mg via ORAL
  Filled 2015-07-10 (×2): qty 1

## 2015-07-10 MED ORDER — TAMSULOSIN HCL 0.4 MG PO CAPS
0.4000 mg | ORAL_CAPSULE | Freq: Every day | ORAL | Status: DC
  Administered 2015-07-10: 0.4 mg via ORAL
  Filled 2015-07-10 (×2): qty 1

## 2015-07-10 MED ORDER — CEFAZOLIN SODIUM-DEXTROSE 2-3 GM-% IV SOLR
INTRAVENOUS | Status: AC
  Filled 2015-07-10: qty 50

## 2015-07-10 MED ORDER — DONEPEZIL HCL 10 MG PO TABS
10.0000 mg | ORAL_TABLET | Freq: Every day | ORAL | Status: DC
  Administered 2015-07-10: 10 mg via ORAL
  Filled 2015-07-10: qty 1

## 2015-07-10 MED ORDER — SERTRALINE HCL 50 MG PO TABS
50.0000 mg | ORAL_TABLET | Freq: Every morning | ORAL | Status: DC
  Administered 2015-07-11: 50 mg via ORAL
  Filled 2015-07-10: qty 1

## 2015-07-10 MED ORDER — DIPHENHYDRAMINE HCL 12.5 MG/5ML PO ELIX: 12.5000 mg | ORAL_SOLUTION | Freq: Four times a day (QID) | ORAL | Status: DC | PRN

## 2015-07-10 MED ORDER — NITROGLYCERIN 0.4 MG SL SUBL: 0.4000 mg | SUBLINGUAL_TABLET | SUBLINGUAL | Status: DC | PRN

## 2015-07-10 MED ORDER — FUROSEMIDE 40 MG PO TABS
40.0000 mg | ORAL_TABLET | Freq: Every day | ORAL | Status: DC
  Administered 2015-07-10 – 2015-07-11 (×2): 40 mg via ORAL
  Filled 2015-07-10 (×2): qty 1

## 2015-07-10 MED ORDER — ZOLPIDEM TARTRATE 5 MG PO TABS: 5.0000 mg | ORAL_TABLET | Freq: Every evening | ORAL | Status: DC | PRN

## 2015-07-10 MED ORDER — SIMVASTATIN 10 MG PO TABS
10.0000 mg | ORAL_TABLET | Freq: Every day | ORAL | Status: DC
  Administered 2015-07-10 – 2015-07-11 (×2): 10 mg via ORAL
  Filled 2015-07-10 (×2): qty 1

## 2015-07-10 MED ORDER — ROCURONIUM BROMIDE 100 MG/10ML IV SOLN
INTRAVENOUS | Status: DC | PRN
  Administered 2015-07-10: 40 mg via INTRAVENOUS
  Administered 2015-07-10: 10 mg via INTRAVENOUS

## 2015-07-10 MED ORDER — ALUM & MAG HYDROXIDE-SIMETH 200-200-20 MG/5ML PO SUSP: 30.0000 mL | Freq: Four times a day (QID) | ORAL | Status: DC | PRN

## 2015-07-10 MED ORDER — SUGAMMADEX SODIUM 200 MG/2ML IV SOLN
INTRAVENOUS | Status: AC
  Filled 2015-07-10: qty 4

## 2015-07-10 MED ORDER — LIDOCAINE HCL (CARDIAC) 20 MG/ML IV SOLN
INTRAVENOUS | Status: AC
  Filled 2015-07-10: qty 5

## 2015-07-10 MED ORDER — ACETAMINOPHEN 325 MG PO TABS: 650.0000 mg | ORAL_TABLET | ORAL | Status: DC | PRN

## 2015-07-10 MED ORDER — LACTATED RINGERS IV SOLN
INTRAVENOUS | Status: DC
  Administered 2015-07-10: 13:00:00 via INTRAVENOUS
  Administered 2015-07-10: 1000 mL via INTRAVENOUS

## 2015-07-10 MED ORDER — FENTANYL CITRATE (PF) 250 MCG/5ML IJ SOLN
INTRAMUSCULAR | Status: AC
  Filled 2015-07-10: qty 5

## 2015-07-10 MED ORDER — FENTANYL CITRATE (PF) 100 MCG/2ML IJ SOLN: 25.0000 ug | INTRAMUSCULAR | Status: DC | PRN

## 2015-07-10 MED ORDER — CEFAZOLIN SODIUM-DEXTROSE 2-4 GM/100ML-% IV SOLN
2.0000 g | INTRAVENOUS | Status: AC
  Administered 2015-07-10: 2 g via INTRAVENOUS
  Filled 2015-07-10: qty 100

## 2015-07-10 MED ORDER — FLUTICASONE PROPIONATE 50 MCG/ACT NA SUSP: 2.0000 | Freq: Every day | NASAL | Status: DC | PRN

## 2015-07-10 MED ORDER — GLYCOPYRROLATE 0.2 MG/ML IJ SOLN
INTRAMUSCULAR | Status: DC | PRN
  Administered 2015-07-10: 0.1 mg via INTRAVENOUS

## 2015-07-10 MED ORDER — DIPHENHYDRAMINE HCL 50 MG/ML IJ SOLN: 12.5000 mg | Freq: Four times a day (QID) | INTRAMUSCULAR | Status: DC | PRN

## 2015-07-10 MED ORDER — ISOSORBIDE DINITRATE 30 MG PO TABS
30.0000 mg | ORAL_TABLET | Freq: Every day | ORAL | Status: DC
  Administered 2015-07-10 – 2015-07-11 (×2): 30 mg via ORAL
  Filled 2015-07-10 (×2): qty 1

## 2015-07-10 MED ORDER — MUPIROCIN 2 % EX OINT
TOPICAL_OINTMENT | CUTANEOUS | Status: AC
  Filled 2015-07-10: qty 22

## 2015-07-10 MED ORDER — SUGAMMADEX SODIUM 200 MG/2ML IV SOLN
INTRAVENOUS | Status: DC | PRN
  Administered 2015-07-10: 137 mg via INTRAVENOUS

## 2015-07-10 MED ORDER — METOPROLOL SUCCINATE ER 50 MG PO TB24
50.0000 mg | ORAL_TABLET | Freq: Every day | ORAL | Status: DC
  Administered 2015-07-10: 50 mg via ORAL
  Filled 2015-07-10: qty 1

## 2015-07-10 MED ORDER — CHLORHEXIDINE GLUCONATE CLOTH 2 % EX PADS
6.0000 | MEDICATED_PAD | Freq: Every day | CUTANEOUS | Status: DC
  Administered 2015-07-11: 6 via TOPICAL

## 2015-07-10 MED ORDER — PROPOFOL 10 MG/ML IV BOLUS
INTRAVENOUS | Status: AC
Start: 2015-07-10 — End: 2015-07-10
  Filled 2015-07-10: qty 20

## 2015-07-10 MED ORDER — MITOMYCIN CHEMO FOR BLADDER INSTILLATION 40 MG
40.0000 mg | Freq: Once | INTRAVENOUS | Status: DC
  Administered 2015-07-10: 40 mg via INTRAVESICAL
  Filled 2015-07-10: qty 40

## 2015-07-10 MED ORDER — ONDANSETRON HCL 4 MG/2ML IJ SOLN: 4.0000 mg | INTRAMUSCULAR | Status: DC | PRN

## 2015-07-10 MED ORDER — METOPROLOL SUCCINATE ER 50 MG PO TB24
50.0000 mg | ORAL_TABLET | Freq: Every day | ORAL | Status: DC
  Administered 2015-07-11: 50 mg via ORAL
  Filled 2015-07-10: qty 1

## 2015-07-10 MED ORDER — GLYCOPYRROLATE 0.2 MG/ML IJ SOLN
INTRAMUSCULAR | Status: AC
  Filled 2015-07-10: qty 3

## 2015-07-10 MED ORDER — MUPIROCIN 2 % EX OINT
1.0000 "application " | TOPICAL_OINTMENT | Freq: Two times a day (BID) | CUTANEOUS | Status: DC
  Administered 2015-07-10 – 2015-07-11 (×2): 1 via NASAL
  Filled 2015-07-10: qty 22

## 2015-07-10 MED ORDER — KCL IN DEXTROSE-NACL 20-5-0.45 MEQ/L-%-% IV SOLN
INTRAVENOUS | Status: DC
  Administered 2015-07-10: 1000 mL via INTRAVENOUS
  Administered 2015-07-11: 06:00:00 via INTRAVENOUS
  Filled 2015-07-10 (×2): qty 1000

## 2015-07-10 MED ORDER — MAGNESIUM HYDROXIDE 400 MG/5ML PO SUSP: 30.0000 mL | Freq: Every day | ORAL | Status: DC | PRN

## 2015-07-10 MED ORDER — HYOSCYAMINE SULFATE 0.125 MG SL SUBL
0.1250 mg | SUBLINGUAL_TABLET | SUBLINGUAL | Status: DC | PRN
  Filled 2015-07-10: qty 1

## 2015-07-10 MED ORDER — PANTOPRAZOLE SODIUM 40 MG PO TBEC
40.0000 mg | DELAYED_RELEASE_TABLET | Freq: Every day | ORAL | Status: DC
  Administered 2015-07-10 – 2015-07-11 (×2): 40 mg via ORAL
  Filled 2015-07-10 (×3): qty 1

## 2015-07-10 MED ORDER — LIDOCAINE HCL (CARDIAC) 20 MG/ML IV SOLN
INTRAVENOUS | Status: DC | PRN
  Administered 2015-07-10: 20 mg via INTRAVENOUS

## 2015-07-10 MED ORDER — HYDROCODONE-ACETAMINOPHEN 5-325 MG PO TABS: 1.0000 | ORAL_TABLET | ORAL | Status: DC | PRN

## 2015-07-10 MED ORDER — ONDANSETRON HCL 4 MG/2ML IJ SOLN
INTRAMUSCULAR | Status: AC
  Filled 2015-07-10: qty 2

## 2015-07-10 MED ORDER — MUPIROCIN 2 % EX OINT
1.0000 "application " | TOPICAL_OINTMENT | Freq: Once | CUTANEOUS | Status: AC
  Administered 2015-07-10: 1 via TOPICAL

## 2015-07-10 MED ORDER — PROPOFOL 10 MG/ML IV BOLUS
INTRAVENOUS | Status: DC | PRN
  Administered 2015-07-10: 100 mg via INTRAVENOUS

## 2015-07-10 MED ORDER — SODIUM CHLORIDE 0.9 % IR SOLN
Status: DC | PRN
  Administered 2015-07-10: 6000 mL

## 2015-07-10 MED ORDER — HYDROMORPHONE HCL 1 MG/ML IJ SOLN: 0.5000 mg | INTRAMUSCULAR | Status: DC | PRN

## 2015-07-10 SURGICAL SUPPLY — 17 items
BAG URINE DRAINAGE (UROLOGICAL SUPPLIES) IMPLANT
BAG URO CATCHER STRL LF (MISCELLANEOUS) ×2 IMPLANT
CATH FOLEY 2WAY SLVR  5CC 20FR (CATHETERS) ×1
CATH FOLEY 2WAY SLVR 5CC 20FR (CATHETERS) ×1 IMPLANT
CATH FOLEY 3WAY 30CC 22FR (CATHETERS) IMPLANT
CATH URET 5FR 28IN OPEN ENDED (CATHETERS) IMPLANT
GLOVE SURG SS PI 8.0 STRL IVOR (GLOVE) IMPLANT
GOWN STRL REUS W/TWL XL LVL3 (GOWN DISPOSABLE) ×2 IMPLANT
HOLDER FOLEY CATH W/STRAP (MISCELLANEOUS) IMPLANT
KIT ASPIRATION TUBING (SET/KITS/TRAYS/PACK) IMPLANT
LOOP CUT BIPOLAR 24F LRG (ELECTROSURGICAL) ×2 IMPLANT
MANIFOLD NEPTUNE II (INSTRUMENTS) ×2 IMPLANT
PACK CYSTO (CUSTOM PROCEDURE TRAY) ×2 IMPLANT
SUT ETHILON 3 0 PS 1 (SUTURE) IMPLANT
SYR 30ML LL (SYRINGE) IMPLANT
SYRINGE IRR TOOMEY STRL 70CC (SYRINGE) IMPLANT
TUBING CONNECTING 10 (TUBING) ×2 IMPLANT

## 2015-07-10 NOTE — Discharge Instructions (Signed)
CYSTOSCOPY HOME CARE INSTRUCTIONS ° °Activity: °Rest for the remainder of the day.  Do not drive or operate equipment today.  You may resume normal activities in one to two days as instructed by your physician.  ° °Meals: °Drink plenty of liquids and eat light foods such as gelatin or soup this evening.  You may return to a normal meal plan tomorrow. ° °Return to Work: °You may return to work in one to two days or as instructed by your physician. ° °Special Instructions / Symptoms: °Call your physician if any of these symptoms occur: ° ° -persistent or heavy bleeding ° -bleeding which continues after first few urination ° -large blood clots that are difficult to pass ° -urine stream diminishes or stops completely ° -fever equal to or higher than 101 degrees Farenheit. ° -cloudy urine with a strong, foul odor ° -severe pain ° °Females should always wipe from front to back after elimination.  You may feel some burning pain when you urinate.  This should disappear with time.  Applying moist heat to the lower abdomen or a hot tub bath may help relieve the pain. \ ° ° °Patient Signature:  ________________________________________________________ ° °Nurse's Signature:  ________________________________________________________ ° °

## 2015-07-10 NOTE — Anesthesia Postprocedure Evaluation (Signed)
Anesthesia Post Note  Patient: Scott [name]  Procedure(s) Performed: Procedure(s) (LRB): TRANSURETHRAL RESECTION OF BLADDER TUMOR (TURBT), CYSTOSCOPY  (N/A)  Patient location during evaluation: PACU Anesthesia Type: General Level of consciousness: awake and alert, oriented and patient cooperative Pain management: pain level controlled Vital Signs Assessment: post-procedure vital signs reviewed and stable Respiratory status: spontaneous breathing, nonlabored ventilation, respiratory function stable and patient connected to nasal cannula oxygen Cardiovascular status: blood pressure returned to baseline and stable Postop Assessment: no signs of nausea or vomiting Anesthetic complications: no    Last Vitals:  Filed Vitals:   07/10/15 1415 07/10/15 1430  BP: 182/78   Pulse: 64 67  Temp:    Resp: 14 20    Last Pain:  Filed Vitals:   07/10/15 1430  PainSc: 0-No pain                 Gregori Abril,E. Hutchinson Isenberg

## 2015-07-10 NOTE — Progress Notes (Signed)
Dr. Jenita Seashore aware of BP 182/78/ No additonal orders at this time.

## 2015-07-10 NOTE — Transfer of Care (Signed)
Immediate Anesthesia Transfer of Care Note  Patient: Scott [name]  Procedure(s) Performed: Procedure(s): TRANSURETHRAL RESECTION OF BLADDER TUMOR (TURBT), CYSTOSCOPY  (N/A)  Patient Location: PACU  Anesthesia Type:General  Level of Consciousness: awake, alert  and oriented  Airway & Oxygen Therapy: Patient Spontanous Breathing and Patient connected to face mask oxygen  Post-op Assessment: Report given to RN, Post -op Vital signs reviewed and stable and Patient moving all extremities  Post vital signs: Reviewed and stable  Last Vitals:  Filed Vitals:   07/10/15 0935  BP: 158/91  Pulse: 55  Temp: 36.6 C  Resp: 18    Complications: No apparent anesthesia complications

## 2015-07-10 NOTE — Anesthesia Preprocedure Evaluation (Addendum)
Anesthesia Evaluation  Patient identified by MRN, date of birth, ID band Patient awake    Reviewed: Allergy & Precautions, NPO status , Patient's Chart, lab work & pertinent test results, reviewed documented beta blocker date and time   History of Anesthesia Complications Negative for: history of anesthetic complications  Airway Mallampati: II  TM Distance: >3 FB Neck ROM: Full    Dental  (+) Poor Dentition, Missing, Chipped, Dental Advisory Given   Pulmonary former smoker (quit 1977),    breath sounds clear to auscultation       Cardiovascular hypertension, Pt. on medications and Pt. on home beta blockers + CAD (non obstructive disease by cath  3/14), + Peripheral Vascular Disease and + DVT  + dysrhythmias Atrial Fibrillation and Supra Ventricular Tachycardia  Rhythm:Irregular Rate:Normal  '16 ECHO:  EF 50-55%, valves OK   Neuro/Psych Depression negative neurological ROS     GI/Hepatic Neg liver ROS, GERD  Medicated and Controlled,  Endo/Other  negative endocrine ROS  Renal/GU Renal disease (acute renal injury)     Musculoskeletal   Abdominal   Peds  Hematology  (+) Blood dyscrasia (coumadin, INR 1.15), ,   Anesthesia Other Findings   Reproductive/Obstetrics                            Anesthesia Physical Anesthesia Plan  ASA: III  Anesthesia Plan: General   Post-op Pain Management:    Induction: Intravenous  Airway Management Planned: Oral ETT  Additional Equipment:   Intra-op Plan:   Post-operative Plan: Extubation in OR  Informed Consent: I have reviewed the patients History and Physical, chart, labs and discussed the procedure including the risks, benefits and alternatives for the proposed anesthesia with the patient or authorized representative who has indicated his/her understanding and acceptance.   Dental advisory given  Plan Discussed with: CRNA and  Surgeon  Anesthesia Plan Comments: (Plan routine monitors, GETA)        Anesthesia Quick Evaluation

## 2015-07-10 NOTE — Brief Op Note (Signed)
07/10/2015  12:56 PM  PATIENT:  Scott [name]  82 y.o. male  PRE-OPERATIVE DIAGNOSIS:  bladder cancer  POST-OPERATIVE DIAGNOSIS:  bladder cancer  PROCEDURE:  Procedure(s): TRANSURETHRAL RESECTION OF BLADDER TUMOR (TURBT), CYSTOSCOPY  (N/A) 4cm INSTILLATION OF MMC  SURGEON:  Surgeon(s) and Role:    * [name], MD - Primary  PHYSICIAN ASSISTANT:   ASSISTANTS: none   ANESTHESIA:   general  EBL:     BLOOD ADMINISTERED:none  DRAINS: Urinary Catheter (Foley)   LOCAL MEDICATIONS USED:  NONE  SPECIMEN:  Source of Specimen:  right bladder tumor  DISPOSITION OF SPECIMEN:  PATHOLOGY  COUNTS:  YES  TOURNIQUET:  * No tourniquets in log *  DICTATION: .Other Dictation: Dictation Number [id number]  PLAN OF CARE: Admit to observation  PATIENT DISPOSITION:  PACU - hemodynamically stable.   Delay start of Pharmacological VTE agent (>24hrs) due to surgical blood loss or risk of bleeding: yes

## 2015-07-10 NOTE — Op Note (Signed)
NAMEBRODE, OBENAUER NO.:  [id number]  MEDICAL RECORD NO.:  [id number]  LOCATION:  1402                         FACILITY:  [facility]  PHYSICIAN:  Marshall Cork. Jeffie Pollock, M.D.    DATE OF BIRTH:  1921-10-03  DATE OF PROCEDURE:  07/10/2015 DATE OF DISCHARGE:                              OPERATIVE REPORT   PROCEDURE:  Cystoscopy with transurethral resection of 4 cm right lateral wall bladder tumor.  Instillation of mitomycin C (in PACU).  PREOPERATIVE DIAGNOSIS:  Bladder tumor.  POSTOPERATIVE DIAGNOSIS:  Bladder tumor.  SURGEON:  Marshall Cork. Jeffie Pollock, MD  ANESTHESIA:  General.  SPECIMEN:  Bladder tumor.  DRAINS:  A 20-French Foley catheter.  BLOOD LOSS:  Minimal.  COMPLICATIONS:  None.  INDICATIONS:  Mr. Chaddock is a 82 year old, white male, who was recently evaluated for hematuria and was found to have a papillary bladder tumor at the right lateral wall.  He presents today for resection and possible mitomycin C instillation.  FINDINGS OF PROCEDURE:  He was given 2 g of Ancef.  He was taken to the operating room where general anesthetic was induced.  He was placed in lithotomy position.  His perineum and genitalia were prepped with Betadine solution and draped in usual sterile fashion.  Cystoscopy was performed using the 23-French scope with 30-degree lens. Examination revealed a normal urethra.  The external sphincter was intact.  Prostatic urethra had evidence of prior TURP.  Examination of bladder revealed ureteral orifices in the normal anatomic position, lateral to the right ureteral orifice was an approximately 4-5 cm papillary bladder tumor on a stalk with some surrounding tumor at the base.  No other lesions were identified within the bladder which had moderate trabeculation.  Once inspection had been performed, a paralytic agent was given and a 28- [place] continuous flow resectoscope sheath was inserted, this was fitted with an Beatrix Fetters handle with a bipolar  loop and 30-degree lens.  Saline was used as the irrigant. The tumor was then resected completely with the surrounding mucosa generously fulgurated.  Once hemostasis was achieved, final inspection revealed no bladder wall perforation, did appear that there was muscle obtained with the specimen.  The resectoscope was removed and a 20-French Foley catheter was inserted.  Balloon was filled with 10 mL sterile fluid.  The catheter was placed, was irrigated with an aseptic syringe with clear return and placed to straight drainage.  He was then taken down from lithotomy position.  His anesthetic was reversed.  He was moved to recovery room in stable condition.  In the recovery room, he underwent instillation of 40 mg of mitomycin C and 40 mL with diluent, this was left indwelling for 40 minutes and the bladder was then drained.  There were no complications.     Marshall Cork. Jeffie Pollock, M.D.     JJW/MEDQ  D:  07/10/2015  T:  07/10/2015  Job:  [id number]

## 2015-07-10 NOTE — Progress Notes (Signed)
Patient ID: Scott [name], male   DOB: 11-10-1921, 82 y.o.   MRN: [id number]  He is doing well post op from a TURBT for a 4cm papillary tumor on the left lateral wall.  He had [place].  BP 201/101 mmHg  Pulse 66  Temp(Src) 97.2 F (36.2 C) (Oral)  Resp 20  Ht 5\' 7"  (1.702 m)  Wt 68.493 kg (151 lb)  BMI 23.64 kg/m2  SpO2 100%  The foley is draining well and the urine is clear.  Plan to D/C foley in AM and DC home.

## 2015-07-10 NOTE — Interval H&P Note (Signed)
History and Physical Interval Note:  07/10/2015 11:56 AM  Scott [name]  has presented today for surgery, with the diagnosis of bladder cancer  The various methods of treatment have been discussed with the patient and family. After consideration of risks, benefits and other options for treatment, the patient has consented to  Procedure(s): TRANSURETHRAL RESECTION OF BLADDER TUMOR (TURBT), CYSTOSCOPY WITH INSTILLATION OF MITOMYCIN C (N/A) as a surgical intervention .  The patient's history has been reviewed, patient examined, no change in status, stable for surgery.  I have reviewed the patient's chart and labs.  Questions were answered to the patient's satisfaction.     Verenice Westrich J

## 2015-07-10 NOTE — Anesthesia Procedure Notes (Signed)
Procedure Name: Intubation Date/Time: 07/10/2015 12:27 PM Performed by: Hewitt Blade Pre-anesthesia Checklist: Patient identified, Emergency Drugs available, Suction available and Patient being monitored Patient Re-evaluated:Patient Re-evaluated prior to inductionOxygen Delivery Method: Circle system utilized Preoxygenation: Pre-oxygenation with 100% oxygen Intubation Type: IV induction Ventilation: Mask ventilation without difficulty and Oral airway inserted - appropriate to patient size Laryngoscope Size: Mac and 3 Grade View: Grade II Tube type: Oral Tube size: 6.5 mm Number of attempts: 1 Airway Equipment and Method: Stylet Placement Confirmation: ETT inserted through vocal cords under direct vision,  positive ETCO2 and breath sounds checked- equal and bilateral Secured at: 22 cm Tube secured with: Tape Dental Injury: Teeth and Oropharynx as per pre-operative assessment

## 2015-07-11 DIAGNOSIS — I4891 Unspecified atrial fibrillation: Secondary | ICD-10-CM | POA: Diagnosis not present

## 2015-07-11 DIAGNOSIS — I2510 Atherosclerotic heart disease of native coronary artery without angina pectoris: Secondary | ICD-10-CM | POA: Diagnosis not present

## 2015-07-11 DIAGNOSIS — I10 Essential (primary) hypertension: Secondary | ICD-10-CM | POA: Diagnosis not present

## 2015-07-11 DIAGNOSIS — I471 Supraventricular tachycardia: Secondary | ICD-10-CM | POA: Diagnosis not present

## 2015-07-11 DIAGNOSIS — I739 Peripheral vascular disease, unspecified: Secondary | ICD-10-CM | POA: Diagnosis not present

## 2015-07-11 DIAGNOSIS — C672 Malignant neoplasm of lateral wall of bladder: Secondary | ICD-10-CM | POA: Diagnosis not present

## 2015-07-11 NOTE — Care Management Note (Signed)
Case Management Note  Patient Details  Name: Scott [name] MRN: [id number] Date of Birth: 05/25/1921  Subjective/Objective: 82 y/o m admitted w/Malignant neoplasm urinary bladder. From home. Patient states he receives personal care services-[facility].His spouse receives [facility]. Received referral for CM-No orders are needed to resume patient's indep personal care services.No d/c needs or CM needs.                   Action/Plan:d/c home.   Expected Discharge Date:                  Expected Discharge Plan:  Home/Self Care  In-House Referral:     Discharge planning Services     Post Acute Care Choice:  Resumption of Svcs/PTA Provider ([facility] Care Services-[facility] 5hrs day m-f) Choice offered to:     DME Arranged:    DME Agency:     HH Arranged:    HH Agency:     Status of Service:  Completed, signed off  Medicare Important Message Given:    Date Medicare IM Given:    Medicare IM give by:    Date Additional Medicare IM Given:    Additional Medicare Important Message give by:     If discussed at [place] of Stay Meetings, dates discussed:    Additional Comments:  Dessa Phi, RN 07/11/2015, 9:21 AM

## 2015-07-11 NOTE — Discharge Summary (Signed)
Patient ID: Scott [name] MRN: [id number] DOB/AGE: November 26, 1921 82 y.o.  Admit date: 07/10/2015 Discharge date: 07/11/2015  Primary Care Physician:  Redge Gainer, MD  Discharge Diagnoses:   Present on Admission:  . Malignant neoplasm of lateral wall of urinary bladder (HCC)  Consults:  None   Discharge Medications:   Medication List    TAKE these medications        alum & mag hydroxide-simeth 200-200-20 MG/5ML suspension  Commonly known as:  MAALOX/MYLANTA  Take 30 mLs by mouth every 6 (six) hours as needed for indigestion or heartburn (dyspepsia).     donepezil 10 MG tablet  Commonly known as:  ARICEPT  TAKE ONE TABLET AT BEDTIME     ferrous sulfate 325 (65 FE) MG tablet  Take 1 tablet (325 mg total) by mouth daily with breakfast.     fluticasone 50 MCG/ACT nasal spray  Commonly known as:  FLONASE  Place 2 sprays into both nostrils daily.     folic acid 1 MG tablet  Commonly known as:  FOLVITE  Take 1 tablet (1 mg total) by mouth daily.     furosemide 40 MG tablet  Commonly known as:  LASIX  TAKE 1 TABLET EVERY DAY     gabapentin 100 MG capsule  Commonly known as:  NEURONTIN  TAKE (2) CAPSULES TWICE DAILY.     hydrocerin Crea  Apply 1 application topically 2 (two) times daily.     isosorbide dinitrate 30 MG tablet  Commonly known as:  ISORDIL  TAKE 1 TABLET ONCE A DAY     metoprolol succinate 50 MG 24 hr tablet  Commonly known as:  TOPROL-XL  TAKE 1 TABLET DAILY     nitroGLYCERIN 0.4 MG SL tablet  Commonly known as:  NITROSTAT  Place 1 tablet (0.4 mg total) under the tongue every 5 (five) minutes as needed for chest pain (up to 3 doses).     pantoprazole 40 MG tablet  Commonly known as:  PROTONIX  Take 1 tablet (40 mg total) by mouth daily.     sertraline 50 MG tablet  Commonly known as:  ZOLOFT  TAKE 1 TABLET IN THE MORNING     simvastatin 10 MG tablet  Commonly known as:  ZOCOR  Take 1 tablet (10 mg total) by mouth daily.     tamsulosin 0.4  MG Caps capsule  Commonly known as:  FLOMAX  TAKE (1) CAPSULE DAILY     ULORIC 40 MG tablet  Generic drug:  febuxostat  TAKE 1 TABLET DAILY     warfarin 2 MG tablet  Commonly known as:  COUMADIN  TAKE 1 TABLET ALONG WITH 2.5MG  TO EQUAL 4.5 MG DAILY     warfarin 2.5 MG tablet  Commonly known as:  COUMADIN  TAKE UP TO 3 TABLETS DAILY AS DIRECTED BY CPP         Significant Diagnostic Studies:  No results found.  Brief H and P: For complete details please refer to admission H and P, but in brief the patient is admitted for resection of a bladder tumor.  Hospital Course:  Active Problems:   Malignant neoplasm of lateral wall of urinary bladder [facility]) Scott [name] underwent TURBT as well as mitomycin instillation on 3/30. He did well postoperatively and was discharged after voiding successfully on 07/11/2015.   Day of Discharge BP 121/67 mmHg  Pulse 78  Temp(Src) 98.2 F (36.8 C) (Oral)  Resp 20  Ht 5\' 7"  (1.702 m)  Wt 68.493  kg (151 lb)  BMI 23.64 kg/m2  SpO2 100%  Results for orders placed or performed during the hospital encounter of 07/10/15 (from the past 24 hour(s))  Surgical pcr screen     Status: Abnormal   Collection Time: 07/10/15  9:35 AM  Result Value Ref Range   MRSA, PCR POSITIVE (A) NEGATIVE   Staphylococcus aureus POSITIVE (A) NEGATIVE  PT- INR Day of Surgery     Status: None   Collection Time: 07/10/15  9:56 AM  Result Value Ref Range   Prothrombin Time 14.9 11.6 - 15.2 seconds   INR 1.15 0.00 - 1.49  CBC     Status: Abnormal   Collection Time: 07/10/15  9:56 AM  Result Value Ref Range   WBC 4.3 4.0 - 10.5 K/uL   RBC 4.77 4.22 - 5.81 MIL/uL   Hemoglobin 12.9 (L) 13.0 - 17.0 g/dL   HCT 38.5 (L) 39.0 - 52.0 %   MCV 80.7 78.0 - 100.0 fL   MCH 27.0 26.0 - 34.0 pg   MCHC 33.5 30.0 - 36.0 g/dL   RDW 15.9 (H) 11.5 - 15.5 %   Platelets 153 150 - 400 K/uL  Basic metabolic panel     Status: Abnormal   Collection Time: 07/10/15  9:56 AM  Result Value Ref  Range   Sodium 141 135 - 145 mmol/L   Potassium 4.1 3.5 - 5.1 mmol/L   Chloride 105 101 - 111 mmol/L   CO2 28 22 - 32 mmol/L   Glucose, Bld 88 65 - 99 mg/dL   BUN 28 (H) 6 - 20 mg/dL   Creatinine, Ser 1.20 0.61 - 1.24 mg/dL   Calcium 9.7 8.9 - 10.3 mg/dL   GFR calc non Af Amer 51 (L) >60 mL/min   GFR calc Af Amer 59 (L) >60 mL/min   Anion gap 8 5 - 15    Physical Exam: General: Alert and awake oriented x3 not in any acute distress. HEENT: anicteric sclera, pupils reactive to light and accommodation CVS: S1-S2 clear no murmur rubs or gallops Chest: clear to auscultation bilaterally, no wheezing rales or rhonchi Abdomen: soft nontender, nondistended, normal bowel sounds, no organomegaly Extremities: no cyanosis, clubbing or edema noted bilaterally Neuro: Cranial nerves II-XII intact, no focal neurological deficits  Disposition:  Home  Diet:  No restrictions  Activity:  Gradually increase   Disposition and Follow-up:     Discharge Instructions    Discharge patient    Complete by:  As directed             Given pt  TESTS THAT NEED FOLLOW-UP  Paology review  DISCHARGE FOLLOW-UP Follow-up Information    Follow up with Malka So, MD On 08/01/2015.   Specialty:  Urology   Why:  2   Contact information:   [address] [place] [address] [phone]       Time spent on Discharge:  10 minutes  Signed: Jorja Loa 07/11/2015, 7:49 AM

## 2015-07-11 NOTE — Care Management Obs Status (Signed)
[place] NOTIFICATION   Patient Details  Name: Scott [name] MRN: [id number] Date of Birth: 18-Aug-1921   Medicare Observation Status Notification Given:  Yes    MahabirJuliann Pulse, RN 07/11/2015, 9:29 AM

## 2015-07-15 ENCOUNTER — Other Ambulatory Visit: Payer: Self-pay | Admitting: Family Medicine

## 2015-07-18 ENCOUNTER — Ambulatory Visit (INDEPENDENT_AMBULATORY_CARE_PROVIDER_SITE_OTHER): Payer: Medicare Other | Admitting: Pharmacist

## 2015-07-18 DIAGNOSIS — I82403 Acute embolism and thrombosis of unspecified deep veins of lower extremity, bilateral: Secondary | ICD-10-CM

## 2015-07-18 DIAGNOSIS — Z7901 Long term (current) use of anticoagulants: Secondary | ICD-10-CM | POA: Diagnosis not present

## 2015-07-18 DIAGNOSIS — I482 Chronic atrial fibrillation: Secondary | ICD-10-CM | POA: Diagnosis not present

## 2015-07-18 DIAGNOSIS — Z86711 Personal history of pulmonary embolism: Secondary | ICD-10-CM

## 2015-07-18 LAB — POCT INR: INR: 1.3

## 2015-07-26 LAB — POCT INR: INR: 1.3

## 2015-07-28 ENCOUNTER — Ambulatory Visit (INDEPENDENT_AMBULATORY_CARE_PROVIDER_SITE_OTHER): Payer: Self-pay | Admitting: Pharmacist

## 2015-07-28 DIAGNOSIS — Z86711 Personal history of pulmonary embolism: Secondary | ICD-10-CM

## 2015-07-28 DIAGNOSIS — I82403 Acute embolism and thrombosis of unspecified deep veins of lower extremity, bilateral: Secondary | ICD-10-CM

## 2015-08-01 ENCOUNTER — Ambulatory Visit (INDEPENDENT_AMBULATORY_CARE_PROVIDER_SITE_OTHER): Payer: Medicare Other | Admitting: Urology

## 2015-08-01 DIAGNOSIS — C670 Malignant neoplasm of trigone of bladder: Secondary | ICD-10-CM | POA: Diagnosis not present

## 2015-08-01 DIAGNOSIS — R3915 Urgency of urination: Secondary | ICD-10-CM | POA: Diagnosis not present

## 2015-08-01 DIAGNOSIS — N401 Enlarged prostate with lower urinary tract symptoms: Secondary | ICD-10-CM | POA: Diagnosis not present

## 2015-08-10 LAB — POCT INR: INR: 2.4

## 2015-08-11 ENCOUNTER — Ambulatory Visit (INDEPENDENT_AMBULATORY_CARE_PROVIDER_SITE_OTHER): Payer: Self-pay | Admitting: Pharmacist

## 2015-08-11 DIAGNOSIS — I82403 Acute embolism and thrombosis of unspecified deep veins of lower extremity, bilateral: Secondary | ICD-10-CM

## 2015-08-11 DIAGNOSIS — Z86711 Personal history of pulmonary embolism: Secondary | ICD-10-CM

## 2015-08-15 LAB — POCT INR: INR: 2.4

## 2015-08-18 ENCOUNTER — Ambulatory Visit (INDEPENDENT_AMBULATORY_CARE_PROVIDER_SITE_OTHER): Payer: Self-pay | Admitting: Pharmacist

## 2015-08-18 ENCOUNTER — Telehealth: Payer: Self-pay | Admitting: Family Medicine

## 2015-08-18 DIAGNOSIS — I82403 Acute embolism and thrombosis of unspecified deep veins of lower extremity, bilateral: Secondary | ICD-10-CM

## 2015-08-18 DIAGNOSIS — Z86711 Personal history of pulmonary embolism: Secondary | ICD-10-CM

## 2015-08-18 DIAGNOSIS — I4891 Unspecified atrial fibrillation: Secondary | ICD-10-CM

## 2015-08-18 NOTE — Progress Notes (Signed)
Patient checks INR at home with Home INR monitoring.  Billing once per month interupertation fee.  Patient diagnosis - history of DVT / venous thromboembolism and atrial fibrillation Procedure code if G0250

## 2015-08-29 ENCOUNTER — Other Ambulatory Visit: Payer: Self-pay | Admitting: Family Medicine

## 2015-09-04 LAB — POCT INR: INR: 1.9

## 2015-09-05 ENCOUNTER — Ambulatory Visit: Payer: Self-pay | Admitting: Pharmacist

## 2015-09-05 DIAGNOSIS — Z86711 Personal history of pulmonary embolism: Secondary | ICD-10-CM

## 2015-09-05 DIAGNOSIS — I82403 Acute embolism and thrombosis of unspecified deep veins of lower extremity, bilateral: Secondary | ICD-10-CM

## 2015-09-16 ENCOUNTER — Telehealth: Payer: Self-pay | Admitting: Family Medicine

## 2015-09-19 NOTE — Telephone Encounter (Signed)
On [place] desk for signature.

## 2015-10-01 ENCOUNTER — Other Ambulatory Visit: Payer: Self-pay | Admitting: Family

## 2015-10-02 ENCOUNTER — Telehealth: Payer: Self-pay | Admitting: Pharmacist

## 2015-10-02 NOTE — Telephone Encounter (Signed)
Called patient because has been about 3 weeks since we had received an INR result from [facility].  Scott [name] reports that he received a bill from [facility] for $150 for supplies and he has stopped using their service.  They are coming to pick up coagucheck soon.  Appt made for patient for next week to have INR checked in our office.

## 2015-10-03 IMAGING — US US EXTREM LOW VENOUS*R*
1 series · 13 of 24 positions shown · non-contrast
Comparison: 03/15/2013

CLINICAL DATA: Right lower extremity edema with history of prior
DVT in 8128.



[Series 1: us extrem low venous*right* · 0.06mm/px · 13 of 63 slices shown]
[im 1/63]
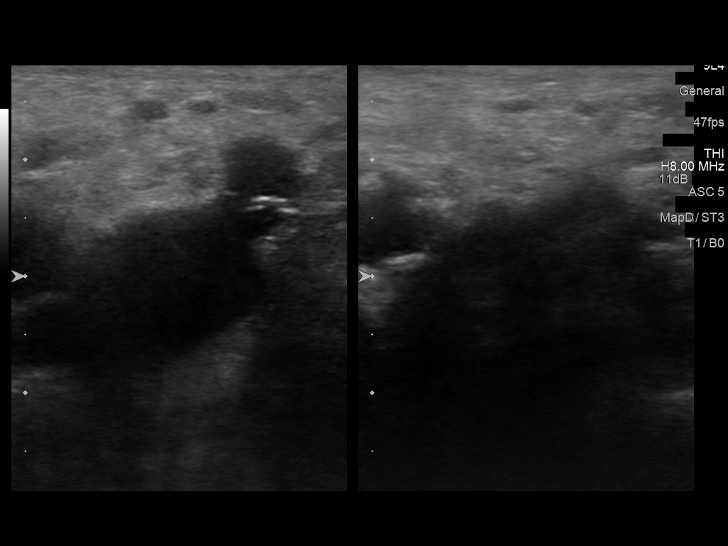
[im 6/63]
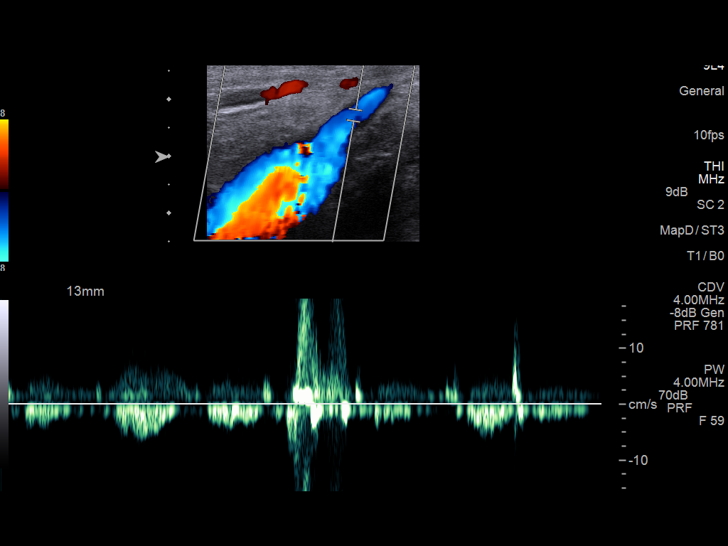
[im 11/63]
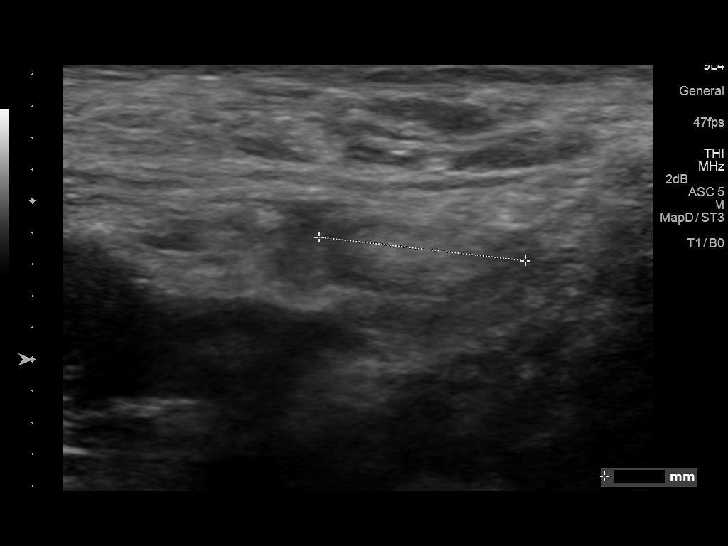
[im 17/63]
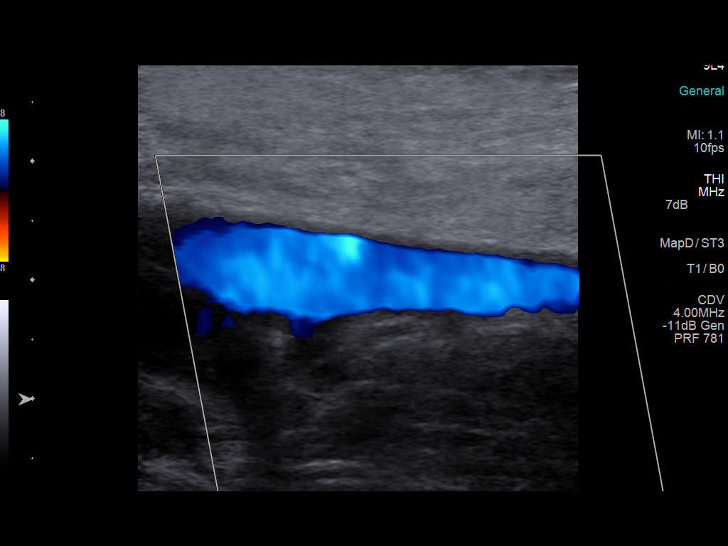
[im 22/63]
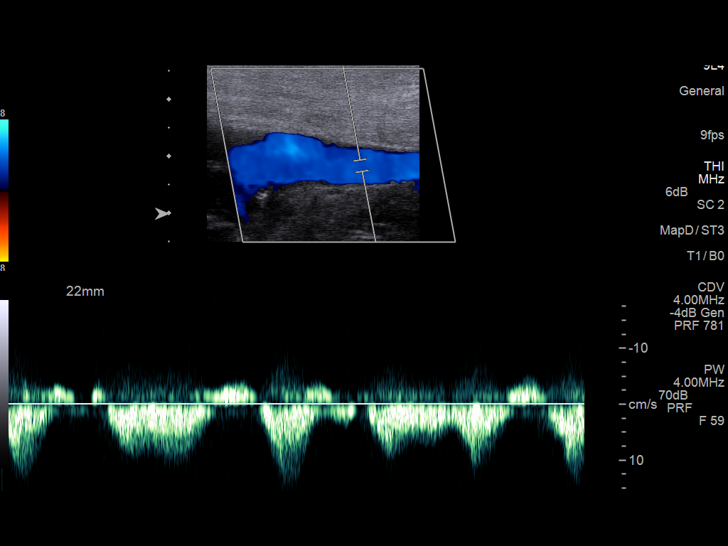
[im 27/63]
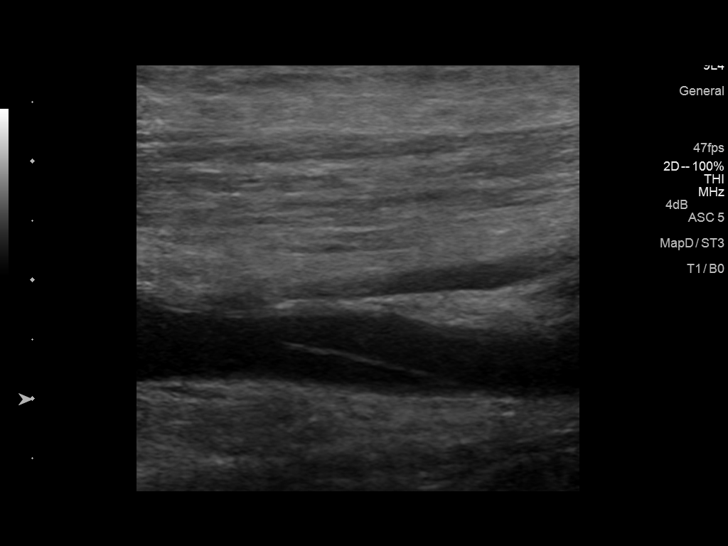
[im 33/63]
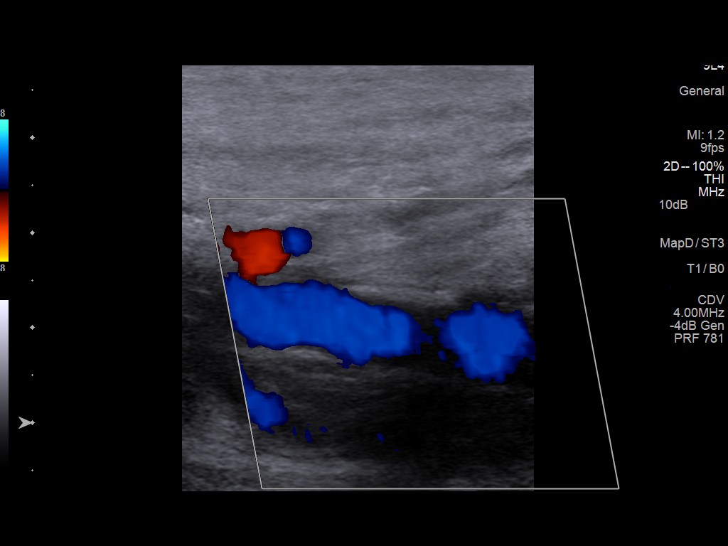
[im 36/63]
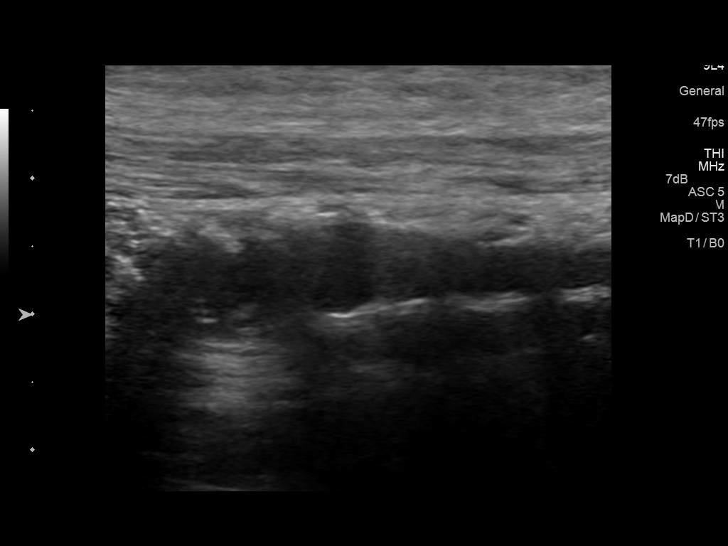
[im 41/63]
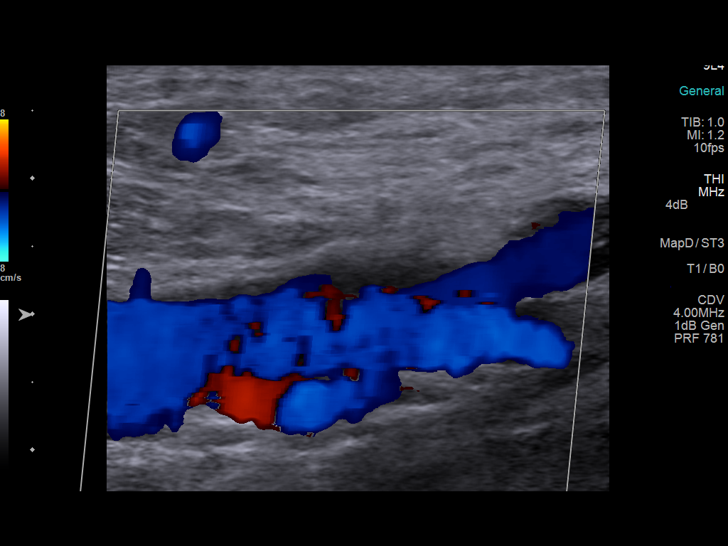
[im 46/63]
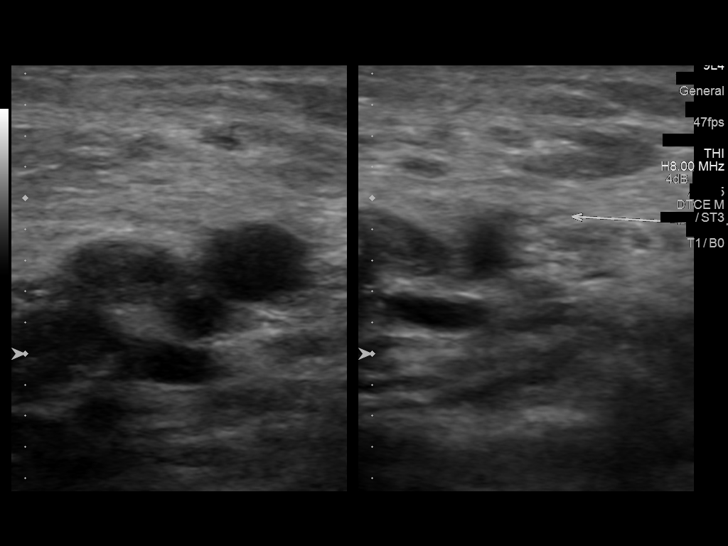
[im 52/63]
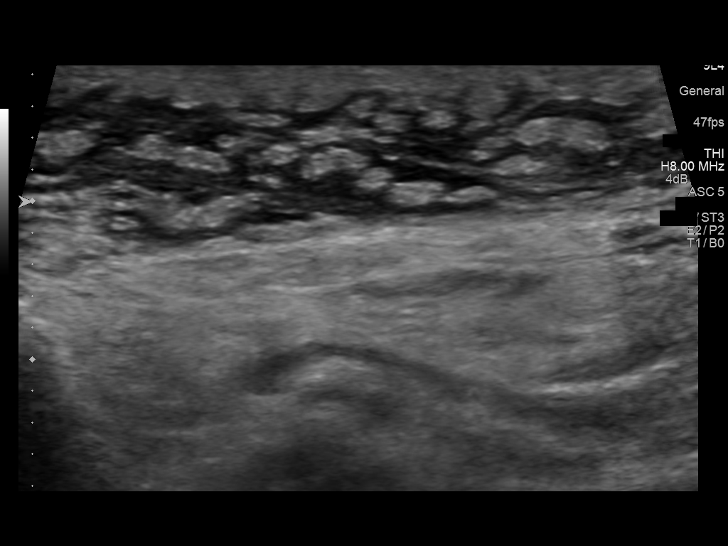
[im 57/63]
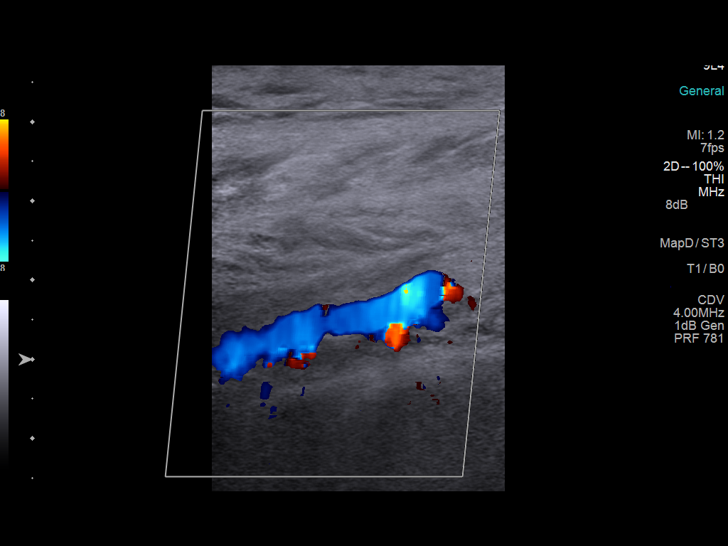
[im 63/63]
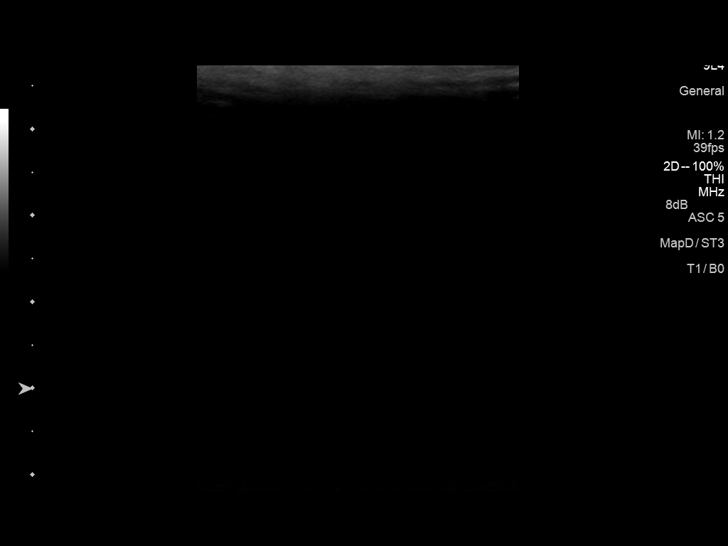

[13 of 24 positions shown; findings below may reference images not displayed]

FINDINGS: Contralateral Common Femoral Vein: Respiratory phasicity is normal
and symmetric with the symptomatic side. No evidence of thrombus.
Normal compressibility.

Common Femoral Vein: No evidence of thrombus. Normal
compressibility, respiratory phasicity and response to augmentation.

Saphenofemoral Junction: No evidence of thrombus. Normal
compressibility and flow on color Doppler imaging.

Profunda Femoral Vein: No evidence of thrombus. Normal
compressibility and flow on color Doppler imaging.

Femoral Vein: Mild amount of echogenic mural thrombus in the femoral
vein of the proximal thigh is nonocclusive and likely related to
chronic thrombus. A web is present in the mid femoral vein in the
region of prior DVT seen on the 8128 study and is nonocclusive.

Popliteal Vein: No evidence of thrombus. Normal compressibility,
respiratory phasicity and response to augmentation.

Calf Veins: No evidence of thrombus. Normal compressibility and flow
on color Doppler imaging.

Superficial Great Saphenous Vein: No evidence of thrombus. Normal
compressibility and flow on color Doppler imaging.

Venous Reflux:  None.

Other Findings: No evidence of superficial thrombophlebitis or
abnormal fluid collection.
IMPRESSION: Mild amount of mural thrombus in the femoral vein of the thigh is
nonocclusive and likely related to chronic thrombus from prior DVT.
A web in the mid femoral veins in the region of prior DVT in 8128.

## 2015-10-10 ENCOUNTER — Ambulatory Visit (INDEPENDENT_AMBULATORY_CARE_PROVIDER_SITE_OTHER): Payer: Medicare Other | Admitting: Pharmacist Clinician (PhC)/ Clinical Pharmacy Specialist

## 2015-10-10 DIAGNOSIS — Z86711 Personal history of pulmonary embolism: Secondary | ICD-10-CM | POA: Diagnosis not present

## 2015-10-10 DIAGNOSIS — I82423 Acute embolism and thrombosis of iliac vein, bilateral: Secondary | ICD-10-CM

## 2015-10-10 LAB — COAGUCHEK XS/INR WAIVED
INR: 2.1 — ABNORMAL HIGH (ref 0.9–1.1)
Prothrombin Time: 25 s

## 2015-10-10 NOTE — Patient Instructions (Signed)
Anticoagulation Dose Instructions as of 10/10/2015      Scott [name] Tue Wed Thu Fri Sat   New Dose 2.5 mg 2.5 mg 2.5 mg 2.5 mg 2.5 mg 2.5 mg 2.5 mg    Description        Continue current warfarin dose of 2.5mg  + 2mg  (to equal 4.5mg ) daily

## 2015-10-17 ENCOUNTER — Other Ambulatory Visit: Payer: Self-pay | Admitting: *Deleted

## 2015-10-17 DIAGNOSIS — R5381 Other malaise: Secondary | ICD-10-CM

## 2015-10-17 NOTE — Progress Notes (Signed)
Pt family - [name] - wants referral to [facility] -- ordered

## 2015-10-29 ENCOUNTER — Other Ambulatory Visit: Payer: Self-pay | Admitting: Family

## 2015-10-30 ENCOUNTER — Ambulatory Visit (INDEPENDENT_AMBULATORY_CARE_PROVIDER_SITE_OTHER): Payer: Medicare Other | Admitting: Nurse Practitioner

## 2015-10-30 ENCOUNTER — Ambulatory Visit (INDEPENDENT_AMBULATORY_CARE_PROVIDER_SITE_OTHER): Payer: Medicare Other

## 2015-10-30 VITALS — BP 165/83 | HR 83 | Temp 101.4°F | Wt 162.0 lb

## 2015-10-30 DIAGNOSIS — R059 Cough, unspecified: Secondary | ICD-10-CM

## 2015-10-30 DIAGNOSIS — R05 Cough: Secondary | ICD-10-CM | POA: Diagnosis not present

## 2015-10-30 DIAGNOSIS — J189 Pneumonia, unspecified organism: Secondary | ICD-10-CM | POA: Diagnosis not present

## 2015-10-30 MED ORDER — AZITHROMYCIN 250 MG PO TABS: ORAL_TABLET | ORAL | Status: DC

## 2015-10-30 MED ORDER — BENZONATATE 100 MG PO CAPS: 100.0000 mg | ORAL_CAPSULE | Freq: Three times a day (TID) | ORAL | Status: DC | PRN

## 2015-10-30 NOTE — Telephone Encounter (Signed)
Last seen 05/19/15  [facility]

## 2015-10-30 NOTE — Patient Instructions (Signed)

## 2015-10-30 NOTE — Progress Notes (Signed)
   Subjective:    Patient ID: Scott [name], male    DOB: 1922/02/07, 82 y.o.   MRN: [id number]  HPI Patient brought in by wife with c/o cough and congestion- slight SOB with sore throat- Fever > 101 at home. Was up all night coughing.    Review of Systems  Constitutional: Positive for fever and chills.  HENT: Positive for congestion, rhinorrhea, sore throat and voice change. Negative for ear pain, sinus pressure and trouble swallowing.   Respiratory: Positive for cough and shortness of breath.   Cardiovascular: Negative.   Gastrointestinal: Negative.   Genitourinary: Negative.   Neurological: Negative.   Psychiatric/Behavioral: Negative.   All other systems reviewed and are negative.      Objective:   Physical Exam  Constitutional: He is oriented to person, place, and time. He appears well-developed and well-nourished. No distress.  HENT:  Right Ear: Hearing, tympanic membrane, external ear and ear canal normal.  Left Ear: Hearing, tympanic membrane, external ear and ear canal normal.  Nose: Mucosal edema and rhinorrhea present. Right sinus exhibits no maxillary sinus tenderness and no frontal sinus tenderness. Left sinus exhibits no maxillary sinus tenderness and no frontal sinus tenderness.  Mouth/Throat: Uvula is midline, oropharynx is clear and moist and mucous membranes are normal.  Cardiovascular: Normal rate and normal heart sounds.   Pulmonary/Chest: Effort normal and breath sounds normal.  Diminished breath sounds bil upper lobes  Neurological: He is alert and oriented to person, place, and time.  Skin: Skin is warm.  Psychiatric: He has a normal mood and affect. His behavior is normal. Judgment and thought content normal.   BP 165/83 mmHg  Pulse 83  Temp(Src) 101.4 F (38.6 C) (Oral)  Wt 162 lb (73.483 kg)  SpO2 91%  Chest x ray- bil upper lobe infiltrates-Preliminary reading by Ronnald Collum, FNP  [facility]        Assessment & Plan:  1. Cough - DG Chest 2  View  2. CAP (community acquired pneumonia) Rest Force fluids If SOB worsens RTO or go to ER Meds ordered this encounter  Medications  . azithromycin (ZITHROMAX Z-PAK) 250 MG tablet    Sig: As directed    Dispense:  1 each    Refill:  0    Order Specific Question:  Supervising Provider    Answer:  Scott [name], Scott [name] [[id number]]  . benzonatate (TESSALON PERLES) 100 MG capsule    Sig: Take 1 capsule (100 mg total) by mouth 3 (three) times daily as needed for cough.    Dispense:  20 capsule    Refill:  0    Order Specific Question:  Supervising Provider    Answer:  Scott Maize [place], FNP

## 2015-10-31 ENCOUNTER — Ambulatory Visit: Payer: Medicare Other | Admitting: Urology

## 2015-11-04 ENCOUNTER — Encounter: Payer: Self-pay | Admitting: Pharmacist

## 2015-11-05 ENCOUNTER — Encounter: Payer: Self-pay | Admitting: Family Medicine

## 2015-11-10 DIAGNOSIS — Z471 Aftercare following joint replacement surgery: Secondary | ICD-10-CM | POA: Diagnosis not present

## 2015-11-10 DIAGNOSIS — M6281 Muscle weakness (generalized): Secondary | ICD-10-CM | POA: Diagnosis not present

## 2015-11-10 DIAGNOSIS — Z89612 Acquired absence of left leg above knee: Secondary | ICD-10-CM | POA: Diagnosis not present

## 2015-11-10 DIAGNOSIS — R262 Difficulty in walking, not elsewhere classified: Secondary | ICD-10-CM | POA: Diagnosis not present

## 2015-11-12 DIAGNOSIS — Z471 Aftercare following joint replacement surgery: Secondary | ICD-10-CM | POA: Diagnosis not present

## 2015-11-12 DIAGNOSIS — Z89612 Acquired absence of left leg above knee: Secondary | ICD-10-CM | POA: Diagnosis not present

## 2015-11-12 DIAGNOSIS — R262 Difficulty in walking, not elsewhere classified: Secondary | ICD-10-CM | POA: Diagnosis not present

## 2015-11-12 DIAGNOSIS — M6281 Muscle weakness (generalized): Secondary | ICD-10-CM | POA: Diagnosis not present

## 2015-11-13 ENCOUNTER — Other Ambulatory Visit: Payer: Self-pay | Admitting: Family

## 2015-11-14 ENCOUNTER — Ambulatory Visit (INDEPENDENT_AMBULATORY_CARE_PROVIDER_SITE_OTHER): Payer: Medicare Other | Admitting: Urology

## 2015-11-14 DIAGNOSIS — R310 Gross hematuria: Secondary | ICD-10-CM | POA: Diagnosis not present

## 2015-11-14 DIAGNOSIS — C680 Malignant neoplasm of urethra: Secondary | ICD-10-CM | POA: Diagnosis not present

## 2015-11-17 DIAGNOSIS — M6281 Muscle weakness (generalized): Secondary | ICD-10-CM | POA: Diagnosis not present

## 2015-11-17 DIAGNOSIS — R262 Difficulty in walking, not elsewhere classified: Secondary | ICD-10-CM | POA: Diagnosis not present

## 2015-11-17 DIAGNOSIS — Z89612 Acquired absence of left leg above knee: Secondary | ICD-10-CM | POA: Diagnosis not present

## 2015-11-17 DIAGNOSIS — Z471 Aftercare following joint replacement surgery: Secondary | ICD-10-CM | POA: Diagnosis not present

## 2015-11-19 DIAGNOSIS — R262 Difficulty in walking, not elsewhere classified: Secondary | ICD-10-CM | POA: Diagnosis not present

## 2015-11-19 DIAGNOSIS — Z471 Aftercare following joint replacement surgery: Secondary | ICD-10-CM | POA: Diagnosis not present

## 2015-11-19 DIAGNOSIS — Z89612 Acquired absence of left leg above knee: Secondary | ICD-10-CM | POA: Diagnosis not present

## 2015-11-19 DIAGNOSIS — M6281 Muscle weakness (generalized): Secondary | ICD-10-CM | POA: Diagnosis not present

## 2015-11-20 ENCOUNTER — Telehealth: Payer: Self-pay | Admitting: Family Medicine

## 2015-11-20 ENCOUNTER — Other Ambulatory Visit: Payer: Self-pay

## 2015-11-20 DIAGNOSIS — D4959 Neoplasm of unspecified behavior of other genitourinary organ: Secondary | ICD-10-CM

## 2015-11-20 NOTE — Telephone Encounter (Signed)
Patient called to confirm appt for protime for next week 11/24/15 at 11:15am

## 2015-11-21 NOTE — Patient Instructions (Signed)
Scott [name]  11/21/2015     @PREFPERIOPPHARMACY @   Your procedure is scheduled on   11/28/2015  Report to [facility] at  1015  A.M.  Call this number if you have problems the morning of surgery:  [phone]   Remember:  Do not eat food or drink liquids after midnight.  Take these medicines the morning of surgery with A SIP OF WATER  Neurontin, isordil, metoprolol, protonix, zoloft, flomax, uloric.   Do not wear jewelry, make-up or nail polish.  Do not wear lotions, powders, or perfumes.  You may wear deoderant.  Do not shave 48 hours prior to surgery.  Men may shave face and neck.  Do not bring valuables to the hospital.  [facility] is not responsible for any belongings or valuables.  Contacts, dentures or bridgework may not be worn into surgery.  Leave your suitcase in the car.  After surgery it may be brought to your room.  For patients admitted to the hospital, discharge time will be determined by your treatment team.  Patients discharged the day of surgery will not be allowed to drive home.   Name and phone number of your driver:   family Special instructions:  none  Please read over the following fact sheets that you were given. Anesthesia Post-op Instructions and Care and Recovery After Surgery       Cystoscopy Cystoscopy is a procedure that is used to help your caregiver diagnose and sometimes treat conditions that affect your lower urinary tract. Your lower urinary tract includes your bladder and the tube through which urine passes from your bladder out of your body (urethra). Cystoscopy is performed with a thin, tube-shaped instrument (cystoscope). The cystoscope has lenses and a light at the end so that your caregiver can see inside your bladder. The cystoscope is inserted at the entrance of your urethra. Your caregiver guides it through your urethra and into your bladder. There are two main types of cystoscopy:  Flexible cystoscopy (with a flexible  cystoscope).  Rigid cystoscopy (with a rigid cystoscope). Cystoscopy may be recommended for many conditions, including:  Urinary tract infections.  Blood in your urine (hematuria).  Loss of bladder control (urinary incontinence) or overactive bladder.  Unusual cells found in a urine sample.  Urinary blockage.  Painful urination. Cystoscopy may also be done to remove a sample of your tissue to be checked under a microscope (biopsy). It may also be done to remove or destroy bladder stones. LET YOUR CAREGIVER KNOW ABOUT:  Allergies to food or medicine.  Medicines taken, including vitamins, herbs, eyedrops, over-the-counter medicines, and creams.  Use of steroids (by mouth or creams).  Previous problems with anesthetics or numbing medicines.  History of bleeding problems or blood clots.  Previous surgery.  Other health problems, including diabetes and kidney problems.  Possibility of pregnancy, if this applies. PROCEDURE The area around the opening to your urethra will be cleaned. A medicine to numb your urethra (local anesthetic) is used. If a tissue sample or stone is removed during the procedure, you may be given a medicine to make you sleep (general anesthetic). Your caregiver will gently insert the tip of the cystoscope into your urethra. The cystoscope will be slowly glided through your urethra and into your bladder. Sterile fluid will flow through the cystoscope and into your bladder. The fluid will expand and stretch your bladder. This gives your caregiver a better view of your bladder walls. The  procedure lasts about 15-20 minutes. AFTER THE PROCEDURE If a local anesthetic is used, you will be allowed to go home as soon as you are ready. If a general anesthetic is used, you will be taken to a recovery area until you are stable. You may have temporary bleeding and burning on urination.   This information is not intended to replace advice given to you by your health care  provider. Make sure you discuss any questions you have with your health care provider.   Document Released: 03/26/2000 Document Revised: 04/19/2014 Document Reviewed: 09/20/2011 Elsevier Interactive Patient Education 2016 [place]. Cystoscopy, Care After Refer to this sheet in the next few weeks. These instructions provide you with information on caring for yourself after your procedure. Your caregiver may also give you more specific instructions. Your treatment has been planned according to current medical practices, but problems sometimes occur. Call your caregiver if you have any problems or questions after your procedure. HOME CARE INSTRUCTIONS  Things you can do to ease any discomfort after your procedure include:  Drinking enough water and fluids to keep your urine clear or pale yellow.  Taking a warm bath to relieve any burning feelings. SEEK IMMEDIATE MEDICAL CARE IF:   You have an increase in blood in your urine.  You notice blood clots in your urine.  You have difficulty passing urine.  You have the chills.  You have abdominal pain.  You have a fever or persistent symptoms for more than 2-3 days.  You have a fever and your symptoms suddenly get worse. MAKE SURE YOU:   Understand these instructions.  Will watch your condition.  Will get help right away if you are not doing well or get worse.   This information is not intended to replace advice given to you by your health care provider. Make sure you discuss any questions you have with your health care provider.   Document Released: 10/16/2004 Document Revised: 04/19/2014 Document Reviewed: 09/20/2011 Elsevier Interactive Patient Education 2016 [employer] PATIENT INSTRUCTIONS POST-ANESTHESIA  IMMEDIATELY FOLLOWING SURGERY:  Do not drive or operate machinery for the first twenty four hours after surgery.  Do not make any important decisions for twenty four hours after surgery or while taking narcotic pain  medications or sedatives.  If you develop intractable nausea and vomiting or a severe headache please notify your doctor immediately.  FOLLOW-UP:  Please make an appointment with your surgeon as instructed. You do not need to follow up with anesthesia unless specifically instructed to do so.  WOUND CARE INSTRUCTIONS (if applicable):  Keep a dry clean dressing on the anesthesia/puncture wound site if there is drainage.  Once the wound has quit draining you may leave it open to air.  Generally you should leave the bandage intact for twenty four hours unless there is drainage.  If the epidural site drains for more than 36-48 hours please call the anesthesia department.  QUESTIONS?:  Please feel free to call your physician or the hospital operator if you have any questions, and they will be happy to assist you.

## 2015-11-24 ENCOUNTER — Encounter (HOSPITAL_COMMUNITY): Payer: Self-pay

## 2015-11-24 ENCOUNTER — Encounter (HOSPITAL_COMMUNITY)
Admission: RE | Admit: 2015-11-24 | Discharge: 2015-11-24 | Disposition: A | Discharge status: Home / Self Care | Payer: Medicare Other | Source: Ambulatory Visit | Attending: Urology | Admitting: Urology

## 2015-11-24 DIAGNOSIS — Z01812 Encounter for preprocedural laboratory examination: Secondary | ICD-10-CM | POA: Diagnosis not present

## 2015-11-24 DIAGNOSIS — Z86718 Personal history of other venous thrombosis and embolism: Secondary | ICD-10-CM | POA: Insufficient documentation

## 2015-11-24 DIAGNOSIS — I2510 Atherosclerotic heart disease of native coronary artery without angina pectoris: Secondary | ICD-10-CM | POA: Insufficient documentation

## 2015-11-24 DIAGNOSIS — Z8619 Personal history of other infectious and parasitic diseases: Secondary | ICD-10-CM | POA: Diagnosis not present

## 2015-11-24 HISTORY — DX: Essential (primary) hypertension: I10

## 2015-11-24 LAB — CBC WITH DIFFERENTIAL/PLATELET
Basophils Absolute: 0 10*3/uL (ref 0.0–0.1)
Basophils Relative: 0 %
Eosinophils Absolute: 0.1 10*3/uL (ref 0.0–0.7)
Eosinophils Relative: 2 %
HCT: 36.7 % — ABNORMAL LOW (ref 39.0–52.0)
Hemoglobin: 12.1 g/dL — ABNORMAL LOW (ref 13.0–17.0)
Lymphocytes Relative: 27 %
Lymphs Abs: 1.1 10*3/uL (ref 0.7–4.0)
MCH: 27.3 pg (ref 26.0–34.0)
MCHC: 33 g/dL (ref 30.0–36.0)
MCV: 82.8 fL (ref 78.0–100.0)
Monocytes Absolute: 0.4 10*3/uL (ref 0.1–1.0)
Monocytes Relative: 9 %
Neutro Abs: 2.6 10*3/uL (ref 1.7–7.7)
Neutrophils Relative %: 62 %
Platelets: 164 10*3/uL (ref 150–400)
RBC: 4.43 MIL/uL (ref 4.22–5.81)
RDW: 16.2 % — ABNORMAL HIGH (ref 11.5–15.5)
WBC: 4.2 10*3/uL (ref 4.0–10.5)

## 2015-11-24 LAB — BASIC METABOLIC PANEL
Anion gap: 4 — ABNORMAL LOW (ref 5–15)
BUN: 37 mg/dL — ABNORMAL HIGH (ref 6–20)
CO2: 26 mmol/L (ref 22–32)
Calcium: 9.2 mg/dL (ref 8.9–10.3)
Chloride: 107 mmol/L (ref 101–111)
Creatinine, Ser: 1.22 mg/dL (ref 0.61–1.24)
GFR calc Af Amer: 57 mL/min — ABNORMAL LOW (ref >60)
GFR calc non Af Amer: 50 mL/min — ABNORMAL LOW (ref >60)
Glucose, Bld: 105 mg/dL — ABNORMAL HIGH (ref 65–99)
Potassium: 3.8 mmol/L (ref 3.5–5.1)
Sodium: 137 mmol/L (ref 135–145)

## 2015-11-24 LAB — PROTIME-INR
INR: 2.69
Prothrombin Time: 29.1 seconds — ABNORMAL HIGH (ref 11.4–15.2)

## 2015-11-24 LAB — SURGICAL PCR SCREEN
MRSA, PCR: NEGATIVE
Staphylococcus aureus: NEGATIVE

## 2015-11-24 NOTE — Pre-Procedure Instructions (Signed)
Patient in for PAT. Pt and CNA, Neoma Laming, state that no one instructed him to stop coumadin prior to surgery. [facility] and spoke to Robynn Pane who states patient should stop coumadin per their  Routine presurgery orders. Instructed patient and CNA,Deborah verbally and written about this. [place] verbalized understanding of this.

## 2015-11-25 ENCOUNTER — Ambulatory Visit: Payer: Self-pay | Admitting: Pharmacist

## 2015-11-25 ENCOUNTER — Encounter: Payer: Self-pay | Admitting: Pharmacist

## 2015-11-25 ENCOUNTER — Telehealth: Payer: Self-pay | Admitting: Pharmacist

## 2015-11-25 DIAGNOSIS — Z86711 Personal history of pulmonary embolism: Secondary | ICD-10-CM

## 2015-11-25 DIAGNOSIS — I82423 Acute embolism and thrombosis of iliac vein, bilateral: Secondary | ICD-10-CM

## 2015-11-25 NOTE — Telephone Encounter (Signed)
INR was 2.6 yesterday at [facility].  Pt advised to continue warfarin dose except as he has been told to hold per Dr Jeffie Pollock prior to up coming procedure.

## 2015-11-27 ENCOUNTER — Other Ambulatory Visit: Payer: Self-pay | Admitting: Family Medicine

## 2015-11-28 ENCOUNTER — Ambulatory Visit (HOSPITAL_COMMUNITY): Payer: Medicare Other | Admitting: Anesthesiology

## 2015-11-28 ENCOUNTER — Encounter (HOSPITAL_COMMUNITY)
Admission: RE | Disposition: A | Discharge status: Home / Self Care | Payer: Self-pay | Source: Ambulatory Visit | Attending: Internal Medicine

## 2015-11-28 ENCOUNTER — Inpatient Hospital Stay (HOSPITAL_COMMUNITY)
Admission: RE | Admit: 2015-11-28 | Discharge: 2015-11-29 | DRG: 309 | Disposition: A | Discharge status: Home / Self Care | Payer: Medicare Other | Source: Ambulatory Visit | Attending: Internal Medicine | Admitting: Internal Medicine

## 2015-11-28 DIAGNOSIS — Z7901 Long term (current) use of anticoagulants: Secondary | ICD-10-CM

## 2015-11-28 DIAGNOSIS — C680 Malignant neoplasm of urethra: Secondary | ICD-10-CM | POA: Diagnosis not present

## 2015-11-28 DIAGNOSIS — Z89612 Acquired absence of left leg above knee: Secondary | ICD-10-CM | POA: Diagnosis not present

## 2015-11-28 DIAGNOSIS — R310 Gross hematuria: Secondary | ICD-10-CM | POA: Diagnosis present

## 2015-11-28 DIAGNOSIS — C669 Malignant neoplasm of unspecified ureter: Secondary | ICD-10-CM | POA: Diagnosis not present

## 2015-11-28 DIAGNOSIS — I10 Essential (primary) hypertension: Secondary | ICD-10-CM | POA: Diagnosis not present

## 2015-11-28 DIAGNOSIS — E785 Hyperlipidemia, unspecified: Secondary | ICD-10-CM | POA: Diagnosis not present

## 2015-11-28 DIAGNOSIS — Z538 Procedure and treatment not carried out for other reasons: Secondary | ICD-10-CM | POA: Diagnosis present

## 2015-11-28 DIAGNOSIS — Z8261 Family history of arthritis: Secondary | ICD-10-CM | POA: Diagnosis not present

## 2015-11-28 DIAGNOSIS — Z8249 Family history of ischemic heart disease and other diseases of the circulatory system: Secondary | ICD-10-CM | POA: Diagnosis not present

## 2015-11-28 DIAGNOSIS — I440 Atrioventricular block, first degree: Secondary | ICD-10-CM | POA: Diagnosis present

## 2015-11-28 DIAGNOSIS — Z86718 Personal history of other venous thrombosis and embolism: Secondary | ICD-10-CM | POA: Diagnosis not present

## 2015-11-28 DIAGNOSIS — R001 Bradycardia, unspecified: Secondary | ICD-10-CM

## 2015-11-28 DIAGNOSIS — N368 Other specified disorders of urethra: Secondary | ICD-10-CM | POA: Diagnosis present

## 2015-11-28 DIAGNOSIS — Z801 Family history of malignant neoplasm of trachea, bronchus and lung: Secondary | ICD-10-CM | POA: Diagnosis not present

## 2015-11-28 DIAGNOSIS — Z8551 Personal history of malignant neoplasm of bladder: Secondary | ICD-10-CM

## 2015-11-28 DIAGNOSIS — I5022 Chronic systolic (congestive) heart failure: Secondary | ICD-10-CM | POA: Diagnosis not present

## 2015-11-28 DIAGNOSIS — Z87891 Personal history of nicotine dependence: Secondary | ICD-10-CM

## 2015-11-28 DIAGNOSIS — Z86711 Personal history of pulmonary embolism: Secondary | ICD-10-CM

## 2015-11-28 DIAGNOSIS — I110 Hypertensive heart disease with heart failure: Secondary | ICD-10-CM | POA: Diagnosis not present

## 2015-11-28 DIAGNOSIS — I739 Peripheral vascular disease, unspecified: Secondary | ICD-10-CM | POA: Diagnosis present

## 2015-11-28 DIAGNOSIS — I4891 Unspecified atrial fibrillation: Secondary | ICD-10-CM | POA: Diagnosis present

## 2015-11-28 DIAGNOSIS — I429 Cardiomyopathy, unspecified: Secondary | ICD-10-CM | POA: Diagnosis present

## 2015-11-28 DIAGNOSIS — H409 Unspecified glaucoma: Secondary | ICD-10-CM | POA: Diagnosis present

## 2015-11-28 DIAGNOSIS — I5032 Chronic diastolic (congestive) heart failure: Secondary | ICD-10-CM | POA: Diagnosis not present

## 2015-11-28 DIAGNOSIS — I447 Left bundle-branch block, unspecified: Secondary | ICD-10-CM | POA: Diagnosis present

## 2015-11-28 DIAGNOSIS — I5042 Chronic combined systolic (congestive) and diastolic (congestive) heart failure: Secondary | ICD-10-CM | POA: Diagnosis present

## 2015-11-28 DIAGNOSIS — D4959 Neoplasm of unspecified behavior of other genitourinary organ: Secondary | ICD-10-CM | POA: Diagnosis not present

## 2015-11-28 DIAGNOSIS — T4145XA Adverse effect of unspecified anesthetic, initial encounter: Secondary | ICD-10-CM | POA: Diagnosis present

## 2015-11-28 DIAGNOSIS — I2510 Atherosclerotic heart disease of native coronary artery without angina pectoris: Secondary | ICD-10-CM | POA: Diagnosis present

## 2015-11-28 HISTORY — DX: Bradycardia, unspecified: R00.1

## 2015-11-28 LAB — COMPREHENSIVE METABOLIC PANEL
ALT: 13 U/L — ABNORMAL LOW (ref 17–63)
AST: 20 U/L (ref 15–41)
Albumin: 3.6 g/dL (ref 3.5–5.0)
Alkaline Phosphatase: 158 U/L — ABNORMAL HIGH (ref 38–126)
Anion gap: 4 — ABNORMAL LOW (ref 5–15)
BUN: 20 mg/dL (ref 6–20)
CO2: 29 mmol/L (ref 22–32)
Calcium: 9 mg/dL (ref 8.9–10.3)
Chloride: 104 mmol/L (ref 101–111)
Creatinine, Ser: 1.06 mg/dL (ref 0.61–1.24)
GFR calc Af Amer: >60 mL/min (ref >60)
GFR calc non Af Amer: 59 mL/min — ABNORMAL LOW (ref >60)
Glucose, Bld: 87 mg/dL (ref 65–99)
Potassium: 4.1 mmol/L (ref 3.5–5.1)
Sodium: 137 mmol/L (ref 135–145)
Total Bilirubin: 0.6 mg/dL (ref 0.3–1.2)
Total Protein: 6.9 g/dL (ref 6.5–8.1)

## 2015-11-28 LAB — CBC WITH DIFFERENTIAL/PLATELET
Basophils Absolute: 0 10*3/uL (ref 0.0–0.1)
Basophils Relative: 0 %
Eosinophils Absolute: 0.1 10*3/uL (ref 0.0–0.7)
Eosinophils Relative: 2 %
HCT: 38.4 % — ABNORMAL LOW (ref 39.0–52.0)
Hemoglobin: 12.6 g/dL — ABNORMAL LOW (ref 13.0–17.0)
Lymphocytes Relative: 31 %
Lymphs Abs: 1 10*3/uL (ref 0.7–4.0)
MCH: 27.2 pg (ref 26.0–34.0)
MCHC: 32.8 g/dL (ref 30.0–36.0)
MCV: 82.9 fL (ref 78.0–100.0)
Monocytes Absolute: 0.4 10*3/uL (ref 0.1–1.0)
Monocytes Relative: 12 %
Neutro Abs: 1.9 10*3/uL (ref 1.7–7.7)
Neutrophils Relative %: 55 %
Platelets: 153 10*3/uL (ref 150–400)
RBC: 4.63 MIL/uL (ref 4.22–5.81)
RDW: 16.1 % — ABNORMAL HIGH (ref 11.5–15.5)
WBC: 3.4 10*3/uL — ABNORMAL LOW (ref 4.0–10.5)

## 2015-11-28 LAB — TSH: TSH: 1.387 u[IU]/mL (ref 0.350–4.500)

## 2015-11-28 LAB — TROPONIN I
Troponin I: <0.03 ng/mL (ref <0.03)
Troponin I: <0.03 ng/mL (ref <0.03)

## 2015-11-28 LAB — PROTIME-INR
INR: 1.27
Prothrombin Time: 16 seconds — ABNORMAL HIGH (ref 11.4–15.2)

## 2015-11-28 LAB — MAGNESIUM: Magnesium: 1.9 mg/dL (ref 1.7–2.4)

## 2015-11-28 LAB — PHOSPHORUS: Phosphorus: 3.1 mg/dL (ref 2.5–4.6)

## 2015-11-28 SURGERY — CANCELLED PROCEDURE
Anesthesia: General

## 2015-11-28 MED ORDER — ONDANSETRON HCL 4 MG/2ML IJ SOLN
4.0000 mg | Freq: Once | INTRAMUSCULAR | Status: AC
  Administered 2015-11-28: 4 mg via INTRAVENOUS

## 2015-11-28 MED ORDER — FENTANYL CITRATE (PF) 100 MCG/2ML IJ SOLN
25.0000 ug | INTRAMUSCULAR | Status: AC | PRN
  Administered 2015-11-28 – 2015-11-29 (×2): 25 ug via INTRAVENOUS
  Filled 2015-11-28: qty 2

## 2015-11-28 MED ORDER — FENTANYL CITRATE (PF) 100 MCG/2ML IJ SOLN
INTRAMUSCULAR | Status: AC
  Filled 2015-11-28: qty 2

## 2015-11-28 MED ORDER — ISOSORBIDE DINITRATE 20 MG PO TABS
30.0000 mg | ORAL_TABLET | Freq: Every day | ORAL | Status: DC
  Administered 2015-11-28 – 2015-11-29 (×2): 30 mg via ORAL
  Filled 2015-11-28 (×2): qty 2

## 2015-11-28 MED ORDER — GABAPENTIN 100 MG PO CAPS
200.0000 mg | ORAL_CAPSULE | Freq: Two times a day (BID) | ORAL | Status: DC
  Administered 2015-11-28 – 2015-11-29 (×3): 200 mg via ORAL
  Filled 2015-11-28 (×3): qty 2

## 2015-11-28 MED ORDER — ONDANSETRON HCL 4 MG/2ML IJ SOLN
INTRAMUSCULAR | Status: AC
  Filled 2015-11-28: qty 2

## 2015-11-28 MED ORDER — ATROPINE SULFATE 0.4 MG/ML IJ SOLN
INTRAMUSCULAR | Status: AC
  Filled 2015-11-28: qty 1

## 2015-11-28 MED ORDER — SERTRALINE HCL 50 MG PO TABS
50.0000 mg | ORAL_TABLET | Freq: Every morning | ORAL | Status: DC
  Administered 2015-11-28 – 2015-11-29 (×2): 50 mg via ORAL
  Filled 2015-11-28 (×2): qty 1

## 2015-11-28 MED ORDER — DONEPEZIL HCL 5 MG PO TABS
10.0000 mg | ORAL_TABLET | Freq: Every day | ORAL | Status: DC
  Administered 2015-11-28: 10 mg via ORAL
  Filled 2015-11-28 (×2): qty 2

## 2015-11-28 MED ORDER — WARFARIN SODIUM 5 MG PO TABS
5.0000 mg | ORAL_TABLET | Freq: Once | ORAL | Status: AC
  Administered 2015-11-28: 5 mg via ORAL
  Filled 2015-11-28: qty 1

## 2015-11-28 MED ORDER — MIDAZOLAM HCL 2 MG/2ML IJ SOLN
INTRAMUSCULAR | Status: AC
  Filled 2015-11-28: qty 2

## 2015-11-28 MED ORDER — EPHEDRINE SULFATE 50 MG/ML IJ SOLN
INTRAMUSCULAR | Status: AC
  Filled 2015-11-28: qty 1

## 2015-11-28 MED ORDER — GLYCOPYRROLATE 0.2 MG/ML IJ SOLN
INTRAMUSCULAR | Status: DC | PRN
  Administered 2015-11-28: 0.2 mg via INTRAVENOUS

## 2015-11-28 MED ORDER — STERILE WATER FOR IRRIGATION IR SOLN
Status: DC | PRN
  Administered 2015-11-28: 3000 mL

## 2015-11-28 MED ORDER — PANTOPRAZOLE SODIUM 40 MG PO TBEC
40.0000 mg | DELAYED_RELEASE_TABLET | Freq: Every day | ORAL | Status: DC
  Administered 2015-11-28 – 2015-11-29 (×2): 40 mg via ORAL
  Filled 2015-11-28 (×2): qty 1

## 2015-11-28 MED ORDER — CHLORHEXIDINE GLUCONATE CLOTH 2 % EX PADS: 6.0000 | MEDICATED_PAD | Freq: Once | CUTANEOUS | Status: AC

## 2015-11-28 MED ORDER — GLYCOPYRROLATE 0.2 MG/ML IJ SOLN
INTRAMUSCULAR | Status: AC
  Filled 2015-11-28: qty 1

## 2015-11-28 MED ORDER — WARFARIN - PHARMACIST DOSING INPATIENT: Freq: Every day | Status: DC

## 2015-11-28 MED ORDER — SODIUM CHLORIDE 0.9% FLUSH
3.0000 mL | Freq: Two times a day (BID) | INTRAVENOUS | Status: DC
  Administered 2015-11-28 – 2015-11-29 (×2): 3 mL via INTRAVENOUS

## 2015-11-28 MED ORDER — PROPOFOL 10 MG/ML IV BOLUS
INTRAVENOUS | Status: DC | PRN
  Administered 2015-11-28: 30 mg via INTRAVENOUS
  Administered 2015-11-28: 70 mg via INTRAVENOUS

## 2015-11-28 MED ORDER — TAMSULOSIN HCL 0.4 MG PO CAPS
0.4000 mg | ORAL_CAPSULE | Freq: Every day | ORAL | Status: DC
  Administered 2015-11-28 – 2015-11-29 (×2): 0.4 mg via ORAL
  Filled 2015-11-28 (×2): qty 1

## 2015-11-28 MED ORDER — SIMVASTATIN 10 MG PO TABS
10.0000 mg | ORAL_TABLET | Freq: Every day | ORAL | Status: DC
  Administered 2015-11-28 – 2015-11-29 (×2): 10 mg via ORAL
  Filled 2015-11-28 (×2): qty 1

## 2015-11-28 MED ORDER — LACTATED RINGERS IV SOLN
INTRAVENOUS | Status: DC
  Administered 2015-11-28 (×2): via INTRAVENOUS

## 2015-11-28 MED ORDER — PROPOFOL 10 MG/ML IV BOLUS
INTRAVENOUS | Status: AC
  Filled 2015-11-28: qty 20

## 2015-11-28 MED ORDER — CEFAZOLIN SODIUM-DEXTROSE 2-4 GM/100ML-% IV SOLN
2.0000 g | INTRAVENOUS | Status: AC
  Administered 2015-11-28: 2 g via INTRAVENOUS
  Filled 2015-11-28: qty 100

## 2015-11-28 MED ORDER — ATROPINE SULFATE 0.4 MG/ML IJ SOLN
INTRAMUSCULAR | Status: DC | PRN
  Administered 2015-11-28: 0.2 mg via INTRAVENOUS
  Administered 2015-11-28: 0.4 mg via INTRAVENOUS
  Administered 2015-11-28: 0.2 mg via INTRAVENOUS

## 2015-11-28 MED ORDER — ENOXAPARIN SODIUM 40 MG/0.4ML ~~LOC~~ SOLN
40.0000 mg | SUBCUTANEOUS | Status: DC
  Administered 2015-11-28: 40 mg via SUBCUTANEOUS
  Filled 2015-11-28: qty 0.4

## 2015-11-28 MED ORDER — MIDAZOLAM HCL 2 MG/2ML IJ SOLN
1.0000 mg | INTRAMUSCULAR | Status: DC | PRN
  Administered 2015-11-28: 2 mg via INTRAVENOUS

## 2015-11-28 SURGICAL SUPPLY — 19 items
BAG DRAIN URO TABLE W/ADPT NS (DRAPE) ×2 IMPLANT
BAG DRN 8 ADPR NS SKTRN CSTL (DRAPE) ×1
BAG HAMPER (MISCELLANEOUS) ×2 IMPLANT
CATH OPEN TIP 5FR (CATHETERS) IMPLANT
CATH URET 5FR 28IN OPEN ENDED (CATHETERS) IMPLANT
CLOTH BEACON ORANGE TIMEOUT ST (SAFETY) ×4 IMPLANT
GLOVE SURG SS PI 8.0 STRL IVOR (GLOVE) ×2 IMPLANT
GOWN STRL REUS W/TWL LRG LVL3 (GOWN DISPOSABLE) ×2 IMPLANT
GUIDEWIRE STR DUAL SENSOR (WIRE) IMPLANT
IV NS IRRIG 3000ML ARTHROMATIC (IV SOLUTION) IMPLANT
KIT ROOM TURNOVER AP CYSTO (KITS) ×2 IMPLANT
MANIFOLD NEPTUNE II (INSTRUMENTS) ×2 IMPLANT
PACK CYSTO (CUSTOM PROCEDURE TRAY) ×2 IMPLANT
PAD ARMBOARD 7.5X6 YLW CONV (MISCELLANEOUS) ×2 IMPLANT
SHEATH URET ACCESS 12FR/35CM (UROLOGICAL SUPPLIES) IMPLANT
SHEATH URET ACCESS 12FR/55CM (UROLOGICAL SUPPLIES) IMPLANT
TOWEL OR 17X26 4PK STRL BLUE (TOWEL DISPOSABLE) ×4 IMPLANT
WATER STERILE IRR 1000ML POUR (IV SOLUTION) ×2 IMPLANT
WATER STERILE IRR 3000ML UROMA (IV SOLUTION) ×2 IMPLANT

## 2015-11-28 NOTE — H&P (Signed)
History and Physical    Scott [name] [id number] DOB: 12-23-1921 DOA: 11/28/2015  PCP: Redge Gainer, MD  Patient coming from: Home.   Chief Complaint:   HPI: Scott [name] is a 82 y.o. male with medical history significant of CAD, h/o bladder cancer, chronic systolic heart failure, h/o DVT on coumadin, hypertension, hyperlipidemia, was sscheduled by Dr Jeffie Pollock for ureteral biopsy and fulguration, and during the procedure, he became bradycardic with HR in between 20 's to 30's. He received two doses of atropine and his HR is back in 60's. He was then referred to medical service for further evaluation. He currently denies any symptoms. We do not have any strips for interpretation,. He denies chest pain, sob, cough, fever , or chills. He denies nausea, vomiting or abdominal pain.   Review of Systems: As per HPI otherwise 10 point review of systems negative.    Past Medical History:  Diagnosis Date  . Arthritis    "all over"  . Bladder cancer ([place]) 03/15/2012   pt denies this hx on 10/15/2014  . CAD- non obstructive disease by cath 3/14   . Chronic lower back pain   . Chronic systolic CHF (congestive heart failure) ([place])    a. NICM - patent cors 06/2012, EF 40% at that time. b. 03/2013 eval: EF 20-25%.  . Chronic systolic heart failure ([place]) 10/04/14   EF improved 50-55%  . DDD (degenerative disc disease)   . Depression   . DVT (deep venous thrombosis) ([place])    "I think in both legs"  . DVT, bilateral lower limbs ([place]) 03/2013   a. Bilat DVT dx 03/2013.  Marland Kitchen Elevated troponin    a. Adm 12/30-04/2013 - not felt to represent ACS; ?due to PE. b. Patent cors 06/2012.  Marland Kitchen ETOH abuse   . Frequency of urination   . Gangrene of toe ([place]) 10/03/2014  . Glaucoma   . Heart murmur   . HTN (hypertension)   . Hyperlipidemia   . Hypertension   . Nocturia   . NSVT (nonsustained ventricular tachycardia) ([place])    a. NSVT 06/2012; NSVT also seen during 03/2013 adm. b. Med rx. Not candidate for ICD given  adv age.  Marland Kitchen PAD (peripheral artery disease) ([place])   . Pneumonia    "couple times"  . Popliteal artery aneurysm, bilateral ([facility]) DECUMENTED CHRONIC PARTIAL OCCLUSION--  PT DENIES CLAUDICATION OR ANY OTHER SYMPTOMS  . Pulmonary embolism ([place])    a. By CT angio 04/2013.  Marland Kitchen PVCs (premature ventricular contractions)   . PVD (peripheral vascular disease) ([facility])    Right ABI .75, Left .78 (2006)  . Sepsis due to other etiology ([place]) 10/03/2014  . Syncope    a. Felt to be postural syncope related to diuretics 06/2012.    Past Surgical History:  Procedure Laterality Date  . AMPUTATION Left 10/16/2014   Procedure: AMPUTATION ABOVE KNEE- LEFT;  Surgeon: Mal Misty, MD;  Location: [place];  Service: Vascular;  Laterality: Left;  . CARDIAC CATHETERIZATION  05-11-2004  [facility]   MINIMAL CORANARY PLAQUE/ NORMAL LVF/ EF 55%/  NON-OBSTRUCTIVE LAD 25%  . CARDIAC CATHETERIZATION  06/2012  . CARDIOVASCULAR STRESS TEST  10-15-2010   LOW RISK NUCLEAR STUDY/ NO EVIDENCE OF ISCHEMIA/ NORMAL EF  . CATARACT EXTRACTION W/ INTRAOCULAR LENS  IMPLANT, BILATERAL Bilateral   . CYSTOSCOPY  10/07/2011   Procedure: CYSTOSCOPY;  Surgeon: Malka So, MD;  Location: [facility];  Service: Urology;  Laterality: N/A;  .  CYSTOSCOPY WITH BIOPSY  03/14/2012   Procedure: CYSTOSCOPY WITH BIOPSY;  Surgeon: Malka So, MD;  Location: [facility];  Service: Urology;  Laterality: N/A;  WITH FULGURATION  . EYE SURGERY    . SHOULDER SURGERY  1970's   "don't remember which side or what kind of OR"  . TONSILLECTOMY    . TRANSTHORACIC ECHOCARDIOGRAM  09-22-2010   MODERATE LVH/ EF 123456 GRADE I DIASTOLIC DYSFUNCTION/ AORTIC SCLEROSIS WITHOUT STENOSIS/  RV  SYSTOLIC  MILDLY REDUCED FUNCTION  . TRANSURETHRAL RESECTION OF BLADDER TUMOR  10/07/2011   Procedure: TRANSURETHRAL RESECTION OF BLADDER TUMOR (TURBT);  Surgeon: Malka So, MD;  Location: [facility];  Service: Urology;  Laterality: N/A;  . TRANSURETHRAL  RESECTION OF BLADDER TUMOR N/A 07/10/2015   Procedure: TRANSURETHRAL RESECTION OF BLADDER TUMOR (TURBT), CYSTOSCOPY ;  Surgeon: [name], MD;  Location: [facility];  Service: Urology;  Laterality: N/A;  . TRANSURETHRAL RESECTION OF PROSTATE  03/14/2012   Procedure: TRANSURETHRAL RESECTION OF THE PROSTATE WITH GYRUS INSTRUMENTS;  Surgeon: Malka So, MD;  Location: [facility];  Service: Urology;  Laterality: N/A;     reports that he quit smoking about 40 years ago. His smoking use included Cigarettes. He started smoking about 73 years ago. He has a 20.00 pack-year smoking history. He has never used smokeless tobacco. He reports that he does not drink alcohol or use drugs.  Allergies  Allergen Reactions  . Lipitor [Atorvastatin] Other (See Comments)    unknown    Family History  Problem Relation Age of Onset  . Hypertension Mother   . Arthritis Mother   . Lung cancer Sister   . Deep vein thrombosis Father   . Early death Brother     Prior to Admission medications   Medication Sig Start Date End Date Taking? Authorizing Provider  donepezil (ARICEPT) 10 MG tablet TAKE ONE TABLET AT BEDTIME 07/15/15  Yes Sharion Balloon, FNP  folic acid (FOLVITE) 1 MG tablet Take 1 tablet (1 mg total) by mouth daily. 11/27/15  Yes Mary-Margaret Hassell Done, FNP  furosemide (LASIX) 40 MG tablet TAKE 1 TABLET EVERY DAY 11/27/15  Yes Mary-Margaret Hassell Done, FNP  gabapentin (NEURONTIN) 100 MG capsule TAKE (2) CAPSULES TWICE DAILY. 10/30/15  Yes Sharion Balloon, FNP  hydrocerin (EUCERIN) CREA Apply 1 application topically 2 (two) times daily. 11/04/14  Yes Ivan Anchors Love, PA-C  isosorbide dinitrate (ISORDIL) 30 MG tablet TAKE 1 TABLET ONCE A DAY 11/27/15  Yes Mary-Margaret Hassell Done, FNP  metoprolol succinate (TOPROL-XL) 50 MG 24 hr tablet TAKE 1 TABLET DAILY 07/15/15  Yes Sharion Balloon, FNP  pantoprazole (PROTONIX) 40 MG tablet Take 1 tablet (40 mg total) by mouth daily. 11/27/15  Yes Mary-Margaret Hassell Done, FNP  sertraline (ZOLOFT) 50  MG tablet TAKE 1 TABLET IN THE MORNING 10/30/15  Yes Sharion Balloon, FNP  simvastatin (ZOCOR) 10 MG tablet Take 1 tablet (10 mg total) by mouth daily. 07/08/15  Yes Chipper Herb, MD  tamsulosin (FLOMAX) 0.4 MG CAPS capsule TAKE (1) CAPSULE DAILY 07/08/15  Yes Chipper Herb, MD  ULORIC 40 MG tablet TAKE 1 TABLET DAILY 11/13/15  Yes Chipper Herb, MD  warfarin (COUMADIN) 2 MG tablet TAKE 1 TABLET ALONG WITH 2.5MG  TO EQUAL 4.5 MG DAILY 05/16/15  Yes Chipper Herb, MD  warfarin (COUMADIN) 2.5 MG tablet TAKE UP TO 3 TABLETS DAILY AS DIRECTED BY CPP Patient taking differently: TAKES WITH 2MG  TABLET TO EQUAL 4.5MG  DAILY 06/18/15  Yes  Chipper Herb, MD  azithromycin (ZITHROMAX Z-PAK) 250 MG tablet As directed Patient not taking: Reported on 11/24/2015 10/30/15   Mary-Margaret Hassell Done, FNP  benzonatate (TESSALON PERLES) 100 MG capsule Take 1 capsule (100 mg total) by mouth 3 (three) times daily as needed for cough. Patient not taking: Reported on 11/24/2015 10/30/15   Mary-Margaret Hassell Done, FNP  ferrous sulfate 325 (65 FE) MG tablet Take 1 tablet (325 mg total) by mouth daily with breakfast. Patient not taking: Reported on 11/24/2015 06/25/15   Chipper Herb, MD  nitroGLYCERIN (NITROSTAT) 0.4 MG SL tablet Place 1 tablet (0.4 mg total) under the tongue every 5 (five) minutes as needed for chest pain (up to 3 doses). 04/19/13   Charlie Pitter, PA-C    Physical Exam: Vitals:   11/28/15 1330 11/28/15 1345 11/28/15 1400 11/28/15 1415  BP: 139/74 (!) 152/76 (!) 162/76 (!) 163/73  Pulse: 72 66 71 63  Resp: 13 (!) 8 19 12   Temp:      TempSrc:      SpO2: 98% 96% 100% 99%  Weight:      Height:          Constitutional: NAD, calm, comfortable Vitals:   11/28/15 1330 11/28/15 1345 11/28/15 1400 11/28/15 1415  BP: 139/74 (!) 152/76 (!) 162/76 (!) 163/73  Pulse: 72 66 71 63  Resp: 13 (!) 8 19 12   Temp:      TempSrc:      SpO2: 98% 96% 100% 99%  Weight:      Height:       Eyes: PERRL, lids and conjunctivae  normal ENMT: Mucous membranes are moist. Posterior pharynx clear of any exudate or lesions.Normal dentition.  Neck: normal, supple, no masses, no thyromegaly Respiratory: clear to auscultation bilaterally, no wheezing, no crackles. Normal respiratory effort. No accessory muscle use.  Cardiovascular: Regular rate and rhythm, no murmurs / rubs / gallops. No extremity edema. 2+ pedal pulses. No carotid bruits.  Abdomen: no tenderness, no masses palpated. No hepatosplenomegaly. Bowel sounds positive.  Musculoskeletal: no clubbing / cyanosis. No joint deformity upper and lower extremities. Good ROM, no contractures. Normal muscle tone.  Skin: no rashes, lesions, ulcers. No induration Neurologic: appears to be oriented to place and person.  Psychiatric: Normal judgment and insight. Alert and oriented x 3. Normal mood.     Labs on Admission: I have personally reviewed following labs and imaging studies  CBC:  Recent Labs Lab 11/24/15 1140  WBC 4.2  NEUTROABS 2.6  HGB 12.1*  HCT 36.7*  MCV 82.8  PLT 123456   Basic Metabolic Panel:  Recent Labs Lab 11/24/15 1140  NA 137  K 3.8  CL 107  CO2 26  GLUCOSE 105*  BUN 37*  CREATININE 1.22  CALCIUM 9.2   GFR: Estimated Creatinine Clearance: 36.1 mL/min (by C-G formula based on SCr of 1.22 mg/dL). Liver Function Tests: No results for input(s): AST, ALT, ALKPHOS, BILITOT, PROT, ALBUMIN in the last 168 hours. No results for input(s): LIPASE, AMYLASE in the last 168 hours. No results for input(s): AMMONIA in the last 168 hours. Coagulation Profile:  Recent Labs Lab 11/24/15 1130  INR 2.69   Cardiac Enzymes: No results for input(s): CKTOTAL, CKMB, CKMBINDEX, TROPONINI in the last 168 hours. BNP (last 3 results) No results for input(s): PROBNP in the last 8760 hours. HbA1C: No results for input(s): HGBA1C in the last 72 hours. CBG: No results for input(s): GLUCAP in the last 168 hours. Lipid Profile: No results for  input(s):  CHOL, HDL, LDLCALC, TRIG, CHOLHDL, LDLDIRECT in the last 72 hours. Thyroid Function Tests: No results for input(s): TSH, T4TOTAL, FREET4, T3FREE, THYROIDAB in the last 72 hours. Anemia Panel: No results for input(s): VITAMINB12, FOLATE, FERRITIN, TIBC, IRON, RETICCTPCT in the last 72 hours. Urine analysis:    Component Value Date/Time   COLORURINE RED (A) 04/11/2015 1618   APPEARANCEUR HAZY (A) 04/11/2015 1618   LABSPEC 1.015 04/11/2015 1618   PHURINE 6.5 04/11/2015 1618   GLUCOSEU 100 (A) 04/11/2015 1618   HGBUR LARGE (A) 04/11/2015 1618   BILIRUBINUR NEG 05/19/2015 1203   KETONESUR 15 (A) 04/11/2015 1618   PROTEINUR NEG 05/19/2015 1203   PROTEINUR >300 (A) 04/11/2015 1618   UROBILINOGEN negative 05/19/2015 1203   UROBILINOGEN 1.0 10/22/2014 1804   NITRITE NEG 05/19/2015 1203   NITRITE POSITIVE (A) 04/11/2015 1618   LEUKOCYTESUR Negative 05/19/2015 1203   Sepsis Labs: !!!!!!!!!!!!!!!!!!!!!!!!!!!!!!!!!!!!!!!!!!!! @LABRCNTIP (procalcitonin:4,lacticidven:4) ) Recent Results (from the past 240 hour(s))  Surgical pcr screen     Status: None   Collection Time: 11/24/15 11:40 AM  Result Value Ref Range Status   MRSA, PCR NEGATIVE NEGATIVE Final   Staphylococcus aureus NEGATIVE NEGATIVE Final    Comment:        The Xpert SA Assay (FDA approved for NASAL specimens in patients over 65 years of age), is one component of a comprehensive surveillance program.  Test performance has been validated by [facility] for patients greater than or equal to 72 year old. It is not intended to diagnose infection nor to guide or monitor treatment.      Radiological Exams on Admission: No results found.  EKG: sinus brady with 1 degree AV BLOCK.   Assessment/Plan Active Problems:   Bradycardia   BRADYCARDIA during the procedure, from the anaesthesia. Currently sinus in 60's.  Holding BB tonight.  bp parameters are wnl.  Cardiology consulted and recommendations given.    Ureteral  cancer: - further recommendations as per Dr Jeffie Pollock as outpatient.  - procedure cancelled today.     Hypertension:  Sub optimal. Monitor off medications tonight.    CAD: NO chest pain.    H/o DVT on coumadin.  Resume coumadin.            DVT prophylaxis: coumadin. Code Status: FULL.  Family Communication: none at bedside.  Disposition Plan: pending further eval. Consults called: cardiology Dr Harl Bowie Admission status: inpatient, tele   Maryclare Nydam MD [facility] Pager [phone]  If 7PM-7AM, please contact night-coverage [email or web address] Password [facility]  11/28/2015, 2:34 PM

## 2015-11-28 NOTE — Anesthesia Postprocedure Evaluation (Signed)
Anesthesia Post Note  Patient: Scott [name]  Procedure(s) Performed: Procedure(s) (LRB): CANCELLED PROCEDURE (N/A)  Patient location during evaluation: PACU Anesthesia Type: General Level of consciousness: awake and patient cooperative Pain management: pain level controlled Vital Signs Assessment: post-procedure vital signs reviewed and stable Respiratory status: spontaneous breathing and patient connected to nasal cannula oxygen Cardiovascular status: stable Anesthetic complications: yes (13 minutes after induction patuient heart rate  down to 28-30. Case cancelled. See INTRA-OP RECORD.) Anesthetic complication details: anesthesia complications   Last Vitals:  Vitals:   11/28/15 1245 11/28/15 1300  BP: (!) 141/76 117/81  Pulse: 81 76  Resp: 11 16  Temp:      Last Pain:  Vitals:   11/28/15 1018  TempSrc: Oral                 [name]

## 2015-11-28 NOTE — Interval H&P Note (Signed)
History and Physical Interval Note:  11/28/2015 11:58 AM  Scott [name]  has presented today for surgery, with the diagnosis of urethral tumor  The various methods of treatment have been discussed with the patient and family. After consideration of risks, benefits and other options for treatment, the patient has consented to  Procedure(s): CYSTOSCOPY WITH URETEROSCOPY (N/A) [place] (N/A) as a surgical intervention .  The patient's history has been reviewed, patient examined, no change in status, stable for surgery.  I have reviewed the patient's chart and labs.  Questions were answered to the patient's satisfaction.     Benigna Delisi J

## 2015-11-28 NOTE — Progress Notes (Signed)
89 Patient's son at the bedside at this time requesting to speak with the MD. MD notified.

## 2015-11-28 NOTE — Transfer of Care (Signed)
Immediate Anesthesia Transfer of Care Note  Patient: Scott [name]  Procedure(s) Performed: Procedure(s): CYSTOSCOPY WITH URETEROSCOPY (N/A) CYSTOSCOPY WITH FULGERATION (N/A)  Patient Location: PACU  Anesthesia Type:General  Level of Consciousness: awake and patient cooperative  Airway & Oxygen Therapy: Patient Spontanous Breathing and non-rebreather face mask  Post-op Assessment: Report given to RN and Post -op Vital signs reviewed and stable  Post vital signs: Reviewed and stable  Last Vitals:  Vitals:   11/28/15 1155 11/28/15 1200  BP: (!) 174/81 (!) 171/83  Resp: (!) 0 20  Temp:      Last Pain:  Vitals:   11/28/15 1018  TempSrc: Oral      Patients Stated Pain Goal: 6 ([phone])  Complications: No apparent anesthesia complications

## 2015-11-28 NOTE — Anesthesia Procedure Notes (Signed)
Procedure Name: LMA Insertion Date/Time: 11/28/2015 12:16 PM Performed by: Vista Deck Pre-anesthesia Checklist: Patient identified, Patient being monitored, Emergency Drugs available, Timeout performed and Suction available Patient Re-evaluated:Patient Re-evaluated prior to inductionOxygen Delivery Method: Circle System Utilized Preoxygenation: Pre-oxygenation with 100% oxygen Intubation Type: IV induction Ventilation: Mask ventilation without difficulty LMA: LMA inserted LMA Size: 4.0 Number of attempts: 1 Placement Confirmation: positive ETCO2 and breath sounds checked- equal and bilateral Tube secured with: Tape

## 2015-11-28 NOTE — Op Note (Signed)
Scott [name] experienced severe bradycardia with induction but responded to two doses of atropine.  The decision was made to abort the procedure and a medical evaluation and observation by the hospitalist service was recommended by anesthesia.  I will place the hospitalist consult and attempt to reschedule the procedure at a later date.   We could try to do a fulguration of the urethral lesion under light sedation and local anesthetic to reduce anesthetic risk.

## 2015-11-28 NOTE — Progress Notes (Signed)
ANTICOAGULATION CONSULT NOTE - Initial Consult  Pharmacy Consult for coumadin Indication: atrial fibrillation, h/o DVT  Allergies  Allergen Reactions  . Lipitor [Atorvastatin] Other (See Comments)    unknown    Patient Measurements: Height: 5\' 7"  (170.2 cm) Weight: 162 lb (73.5 kg) IBW/kg (Calculated) : 66.1  Vital Signs: Temp: 97.6 F (36.4 C) (08/18 1448) Temp Source: Oral (08/18 1448) BP: 155/69 (08/18 1448) Pulse Rate: 65 (08/18 1448)  Labs:  Recent Labs  11/28/15 1511  HGB 12.6*  HCT 38.4*  PLT 153  LABPROT 16.0*  INR 1.27  CREATININE 1.06  TROPONINI <0.03    Estimated Creatinine Clearance: 41.6 mL/min (by C-G formula based on SCr of 1.06 mg/dL).   Medical History: Past Medical History:  Diagnosis Date  . Arthritis    "all over"  . Bladder cancer ([place]) 03/15/2012   pt denies this hx on 10/15/2014  . CAD- non obstructive disease by cath 3/14   . Chronic lower back pain   . Chronic systolic CHF (congestive heart failure) ([place])    a. NICM - patent cors 06/2012, EF 40% at that time. b. 03/2013 eval: EF 20-25%.  . Chronic systolic heart failure ([place]) 10/04/14   EF improved 50-55%  . DDD (degenerative disc disease)   . Depression   . DVT (deep venous thrombosis) ([place])    "I think in both legs"  . DVT, bilateral lower limbs ([place]) 03/2013   a. Bilat DVT dx 03/2013.  Marland Kitchen Elevated troponin    a. Adm 12/30-04/2013 - not felt to represent ACS; ?due to PE. b. Patent cors 06/2012.  Marland Kitchen ETOH abuse   . Frequency of urination   . Gangrene of toe ([place]) 10/03/2014  . Glaucoma   . Heart murmur   . HTN (hypertension)   . Hyperlipidemia   . Hypertension   . Nocturia   . NSVT (nonsustained ventricular tachycardia) ([place])    a. NSVT 06/2012; NSVT also seen during 03/2013 adm. b. Med rx. Not candidate for ICD given adv age.  Marland Kitchen PAD (peripheral artery disease) ([place])   . Pneumonia    "couple times"  . Popliteal artery aneurysm, bilateral ([facility]) DECUMENTED CHRONIC PARTIAL  OCCLUSION--  PT DENIES CLAUDICATION OR ANY OTHER SYMPTOMS  . Pulmonary embolism ([place])    a. By CT angio 04/2013.  Marland Kitchen PVCs (premature ventricular contractions)   . PVD (peripheral vascular disease) ([facility])    Right ABI .75, Left .78 (2006)  . Sepsis due to other etiology ([place]) 10/03/2014  . Syncope    a. Felt to be postural syncope related to diuretics 06/2012.    Medications:  Prescriptions Prior to Admission  Medication Sig Dispense Refill Last Dose  . donepezil (ARICEPT) 10 MG tablet TAKE ONE TABLET AT BEDTIME 30 tablet 4 11/27/2015 at Unknown time  . folic acid (FOLVITE) 1 MG tablet Take 1 tablet (1 mg total) by mouth daily. 30 tablet 4 11/27/2015 at Unknown time  . furosemide (LASIX) 40 MG tablet TAKE 1 TABLET EVERY DAY 30 tablet 4 11/27/2015 at Unknown time  . gabapentin (NEURONTIN) 100 MG capsule TAKE (2) CAPSULES TWICE DAILY. 120 capsule 0 11/27/2015 at Unknown time  . hydrocerin (EUCERIN) CREA Apply 1 application topically 2 (two) times daily.  0 11/27/2015 at Unknown time  . isosorbide dinitrate (ISORDIL) 30 MG tablet TAKE 1 TABLET ONCE A DAY 30 tablet 4 11/27/2015 at Unknown time  . metoprolol succinate (TOPROL-XL) 50 MG 24 hr tablet TAKE 1 TABLET DAILY 30 tablet 4 11/27/2015  at Unknown time  . pantoprazole (PROTONIX) 40 MG tablet Take 1 tablet (40 mg total) by mouth daily. 30 tablet 4 11/27/2015 at Unknown time  . sertraline (ZOLOFT) 50 MG tablet TAKE 1 TABLET IN THE MORNING 90 tablet 0 11/27/2015 at Unknown time  . simvastatin (ZOCOR) 10 MG tablet Take 1 tablet (10 mg total) by mouth daily. 30 tablet 6 11/27/2015 at Unknown time  . tamsulosin (FLOMAX) 0.4 MG CAPS capsule TAKE (1) CAPSULE DAILY 30 capsule 6 11/27/2015 at Unknown time  . ULORIC 40 MG tablet TAKE 1 TABLET DAILY 30 tablet 0 11/27/2015 at Unknown time  . warfarin (COUMADIN) 2 MG tablet TAKE 1 TABLET ALONG WITH 2.5MG  TO EQUAL 4.5 MG DAILY 30 tablet 2 11/23/2015  . warfarin (COUMADIN) 2.5 MG tablet TAKE UP TO 3 TABLETS DAILY AS DIRECTED  BY CPP (Patient taking differently: TAKES WITH 2MG  TABLET TO EQUAL 4.5MG  DAILY) 90 tablet 2 11/23/2015  . azithromycin (ZITHROMAX Z-PAK) 250 MG tablet As directed (Patient not taking: Reported on 11/24/2015) 1 each 0 Not Taking at Unknown time  . benzonatate (TESSALON PERLES) 100 MG capsule Take 1 capsule (100 mg total) by mouth 3 (three) times daily as needed for cough. (Patient not taking: Reported on 11/24/2015) 20 capsule 0 Not Taking at Unknown time  . ferrous sulfate 325 (65 FE) MG tablet Take 1 tablet (325 mg total) by mouth daily with breakfast. (Patient not taking: Reported on 11/24/2015) 30 tablet 4 Not Taking at Unknown time  . nitroGLYCERIN (NITROSTAT) 0.4 MG SL tablet Place 1 tablet (0.4 mg total) under the tongue every 5 (five) minutes as needed for chest pain (up to 3 doses). 25 tablet 3 More than a month at Unknown time    Assessment: 82 yo man on coumadin for h/o DVT, afib. Coumadin was on hold PTA for procedure.  He became bradycardic after anesthesia induction and procedure was cancelled. Plan to resume coumadin Goal of Therapy:  INR 2-3 Monitor platelets by anticoagulation protocol: Yes   Plan:  Coumadin 5 mg po today Daily PT/INR  Makynzi [place]land [place] 11/28/2015,7:10 PM

## 2015-11-28 NOTE — Anesthesia Preprocedure Evaluation (Signed)
Anesthesia Evaluation  Patient identified by MRN, date of birth, ID band Patient awake    Reviewed: Allergy & Precautions, NPO status , Patient's Chart, lab work & pertinent test results, reviewed documented beta blocker date and time   History of Anesthesia Complications Negative for: history of anesthetic complications  Airway Mallampati: II  TM Distance: >3 FB Neck ROM: Full    Dental  (+) Poor Dentition, Missing, Chipped, Dental Advisory Given   Pulmonary former smoker,    breath sounds clear to auscultation       Cardiovascular hypertension, Pt. on medications and Pt. on home beta blockers + CAD (non obstructive disease by cath  3/14), + Past MI, + Peripheral Vascular Disease and + DVT  + dysrhythmias Atrial Fibrillation and Supra Ventricular Tachycardia  Rhythm:Irregular Rate:Normal  '16 ECHO:  EF 50-55%   Neuro/Psych PSYCHIATRIC DISORDERS Depression negative neurological ROS     GI/Hepatic Neg liver ROS, GERD  Medicated and Controlled,  Endo/Other  negative endocrine ROS  Renal/GU Renal disease (acute renal injury)     Musculoskeletal   Abdominal   Peds  Hematology  (+) Blood dyscrasia (coumadin, INR 1.15), ,   Anesthesia Other Findings Coumadin, INR=2.7 on 11-24-15  Reproductive/Obstetrics                             Anesthesia Physical Anesthesia Plan  ASA: III  Anesthesia Plan: General   Post-op Pain Management:    Induction: Intravenous  Airway Management Planned: LMA  Additional Equipment:   Intra-op Plan:   Post-operative Plan: Extubation in OR  Informed Consent: I have reviewed the patients History and Physical, chart, labs and discussed the procedure including the risks, benefits and alternatives for the proposed anesthesia with the patient or authorized representative who has indicated his/her understanding and acceptance.     Plan Discussed with:  CRNA  Anesthesia Plan Comments:         Anesthesia Quick Evaluation

## 2015-11-28 NOTE — Consult Note (Signed)
Primary cardiologist:Scott Jake Hochrein/Scott Thompson Grayer Consulting cardiologist: Scott Scott [name] Requesting physician: Scott Hosie Poisson Indication: bradycardia   Clinical Summary Scott [name] is a 82 y.o.male history of NICM with nonobstructive CAD by cath 06/2012, HTN, chronic LBBB, postural syncope related to diuretics, EtOH abuse, PVCs/NSVT, and prior DVT on coumadin, afib on coumadin,  admitted with after experiencing bradycardia after anesthesia induction today for a GU procedure. I discussed with the nurse anesthesis Scott [name]. Patient had received propofol and robinul, was intubated at the time. Heart rates at baseline had been around 50s, however about 13 minutes after induction heart rates dropped to 30s. By report it was sinus bradycardia along with a sinus pause, strips are not available for interpretation. He received 0.8 mg total of atropine with resolution of bradycardia, case was cancelled. Baseline EKG shows underlying conduction disease with 1st degree av block and LBBB. Previous monitors have not shown any significant bradycardia. Last dose of Toprol XL was yesterday.     Labs pending today 8/14 labs: Hgb 12.1, Plt 164, K 3.8, Cr 1.22,    Allergies  Allergen Reactions  . Lipitor [Atorvastatin] Other (See Comments)    unknown    Medications Scheduled Medications: . donepezil  10 mg Oral QHS  . enoxaparin (LOVENOX) injection  40 mg Subcutaneous Q24H  . gabapentin  200 mg Oral BID  . isosorbide dinitrate  30 mg Oral Daily  . pantoprazole  40 mg Oral Daily  . sertraline  50 mg Oral q morning - 10a  . simvastatin  10 mg Oral Daily  . sodium chloride flush  3 mL Intravenous Q12H  . tamsulosin  0.4 mg Oral QPC supper     Infusions: . lactated ringers 50 mL/hr at 11/28/15 1201     PRN Medications:  fentaNYL (SUBLIMAZE) injection, midazolam   Past Medical History:  Diagnosis Date  . Arthritis    "all over"  . Bladder cancer ([place]) 03/15/2012   pt denies  this hx on 10/15/2014  . CAD- non obstructive disease by cath 3/14   . Chronic lower back pain   . Chronic systolic CHF (congestive heart failure) ([place])    a. NICM - patent cors 06/2012, EF 40% at that time. b. 03/2013 eval: EF 20-25%.  . Chronic systolic heart failure ([place]) 10/04/14   EF improved 50-55%  . DDD (degenerative disc disease)   . Depression   . DVT (deep venous thrombosis) ([place])    "I think in both legs"  . DVT, bilateral lower limbs ([place]) 03/2013   a. Bilat DVT dx 03/2013.  Marland Kitchen Elevated troponin    a. Adm 12/30-04/2013 - not felt to represent ACS; ?due to PE. b. Patent cors 06/2012.  Marland Kitchen ETOH abuse   . Frequency of urination   . Gangrene of toe ([place]) 10/03/2014  . Glaucoma   . Heart murmur   . HTN (hypertension)   . Hyperlipidemia   . Hypertension   . Nocturia   . NSVT (nonsustained ventricular tachycardia) ([place])    a. NSVT 06/2012; NSVT also seen during 03/2013 adm. b. Med rx. Not candidate for ICD given adv age.  Marland Kitchen PAD (peripheral artery disease) ([place])   . Pneumonia    "couple times"  . Popliteal artery aneurysm, bilateral (HCC) DECUMENTED CHRONIC PARTIAL OCCLUSION--  PT DENIES CLAUDICATION OR ANY OTHER SYMPTOMS  . Pulmonary embolism ([place])    a. By CT angio 04/2013.  Marland Kitchen PVCs (premature ventricular contractions)   . PVD (peripheral vascular  disease) ([place])    Right ABI .75, Left .78 (2006)  . Sepsis due to other etiology ([place]) 10/03/2014  . Syncope    a. Felt to be postural syncope related to diuretics 06/2012.    Past Surgical History:  Procedure Laterality Date  . AMPUTATION Left 10/16/2014   Procedure: AMPUTATION ABOVE KNEE- LEFT;  Surgeon: Scott Misty, MD;  Location: [facility];  Service: Vascular;  Laterality: Left;  . CARDIAC CATHETERIZATION  05-11-2004  [facility]   MINIMAL CORANARY PLAQUE/ NORMAL LVF/ EF 55%/  NON-OBSTRUCTIVE LAD 25%  . CARDIAC CATHETERIZATION  06/2012  . CARDIOVASCULAR STRESS TEST  10-15-2010   LOW RISK NUCLEAR STUDY/ NO EVIDENCE OF ISCHEMIA/ NORMAL  EF  . CATARACT EXTRACTION W/ INTRAOCULAR LENS  IMPLANT, BILATERAL Bilateral   . CYSTOSCOPY  10/07/2011   Procedure: CYSTOSCOPY;  Surgeon: Scott So, MD;  Location: [facility];  Service: Urology;  Laterality: N/A;  . CYSTOSCOPY WITH BIOPSY  03/14/2012   Procedure: CYSTOSCOPY WITH BIOPSY;  Surgeon: Scott So, MD;  Location: [facility];  Service: Urology;  Laterality: N/A;  WITH FULGURATION  . EYE SURGERY    . SHOULDER SURGERY  1970's   "don't remember which side or what kind of OR"  . TONSILLECTOMY    . TRANSTHORACIC ECHOCARDIOGRAM  09-22-2010   MODERATE LVH/ EF 123456 GRADE I DIASTOLIC DYSFUNCTION/ AORTIC SCLEROSIS WITHOUT STENOSIS/  RV  SYSTOLIC  MILDLY REDUCED FUNCTION  . TRANSURETHRAL RESECTION OF BLADDER TUMOR  10/07/2011   Procedure: TRANSURETHRAL RESECTION OF BLADDER TUMOR (TURBT);  Surgeon: Scott So, MD;  Location: [facility];  Service: Urology;  Laterality: N/A;  . TRANSURETHRAL RESECTION OF BLADDER TUMOR N/A 07/10/2015   Procedure: TRANSURETHRAL RESECTION OF BLADDER TUMOR (TURBT), CYSTOSCOPY ;  Surgeon: Scott Seal, MD;  Location: [facility];  Service: Urology;  Laterality: N/A;  . TRANSURETHRAL RESECTION OF PROSTATE  03/14/2012   Procedure: TRANSURETHRAL RESECTION OF THE PROSTATE WITH GYRUS INSTRUMENTS;  Surgeon: Scott So, MD;  Location: [facility];  Service: Urology;  Laterality: N/A;    Family History  Problem Relation Age of Onset  . Hypertension Mother   . Arthritis Mother   . Lung cancer Sister   . Deep vein thrombosis Father   . Early death Brother     Social History Mr. Endicott reports that he quit smoking about 40 years ago. His smoking use included Cigarettes. He started smoking about 73 years ago. He has a 20.00 pack-year smoking history. He has never used smokeless tobacco. Mr. Nazareno reports that he does not drink alcohol.  Review of Systems CONSTITUTIONAL: No weight loss, fever, chills, weakness or fatigue.  HEENT: Eyes: No visual loss,  blurred vision, double vision or yellow sclerae. No hearing loss, sneezing, congestion, runny nose or sore throat.  SKIN: No rash or itching.  CARDIOVASCULAR: No chest pain, chest pressure or chest discomfort. No palpitations or edema.  RESPIRATORY: No shortness of breath, cough or sputum.  GASTROINTESTINAL: No anorexia, nausea, vomiting or diarrhea. No abdominal pain or blood.  GENITOURINARY: no polyuria, no dysuria NEUROLOGICAL: No headache, dizziness, syncope, paralysis, ataxia, numbness or tingling in the extremities. No change in bowel or bladder control.  MUSCULOSKELETAL: No muscle, back pain, joint pain or stiffness.  HEMATOLOGIC: No anemia, bleeding or bruising.  LYMPHATICS: No enlarged nodes. No history of splenectomy.  PSYCHIATRIC: No history of depression or anxiety.      Physical Examination Blood pressure (!) 155/69, pulse 65, temperature 97.6 F (  36.4 C), temperature source Oral, resp. rate 14, height 5\' 7"  (1.702 m), weight 162 lb (73.5 kg), SpO2 99 %.  Intake/Output Summary (Last 24 hours) at 11/28/15 1529 Last data filed at 11/28/15 1401  Gross per 24 hour  Intake              825 ml  Output                0 ml  Net              825 ml    HEENT: sclera clear, throat clear  Cardiovascular: RRR, no m/r/g, no jvd  Respiratory: CTAB  GI: abdomen soft, NT, ND  MSK: no LE edema  Neuro: no focal deficits  Psych: appropriate affect   Lab Results  Basic Metabolic Panel:  Recent Labs Lab 11/24/15 1140  NA 137  K 3.8  CL 107  CO2 26  GLUCOSE 105*  BUN 37*  CREATININE 1.22  CALCIUM 9.2    Liver Function Tests: No results for input(s): AST, ALT, ALKPHOS, BILITOT, PROT, ALBUMIN in the last 168 hours.  CBC:  Recent Labs Lab 11/24/15 1140  WBC 4.2  NEUTROABS 2.6  HGB 12.1*  HCT 36.7*  MCV 82.8  PLT 164    Cardiac Enzymes: No results for input(s): CKTOTAL, CKMB, CKMBINDEX, TROPONINI in the last 168 hours.  BNP: Invalid input(s):  POCBNP     Impression/Recommendations 1. Bradycardia - episode occurred shortly after receiving propfol and being intubated for surgery - no strips exist for interpretation, per verbal report patient had sinus brady to 30s along with sinus pauses. Reversed quickly with atropine, surgical case cancelled. Currently sinus rhythm low 60s.  - patient with underlying conduction disease on EKG with 1st degree AV block and LBBB. On home Toprol XL with last dose yesterday. This combined with anesthesia likely etiology of transient bradycardiaepisode - monitor on telemetry overnight. Can cycle enzymes, unlikely ishchemic event. If rates above 60s tomorrow would restart Toprol XL at a lower dose for the time being at 25mg  daily tomorrow and discharge.  - we will ask patient have early f/u with his primary cardiologist Scott [name]. From urology note there is an option to try the surgery without general anesthesia, will defer to Scott [name] on his best surgical options from a cardiac standpoint.       Scott [name], M.D.

## 2015-11-28 NOTE — H&P (Signed)
CC/HPI: I am here for a post operative visit.    CC: I have bladder cancer.  HPI: Scott [name] is a 82 year-old male established patient who is here for bladder cancer.  His problem was diagnosed 07/11/2015.   Scott [name] returns today in f/u for his history of bladder cancer that was diagnosed on 3/06/11/15. He had a TURBT with [facility]. He was found to have a HG NMIBC that was 4cm and lateral to the right UO. He has had a few drops of blood from the penis on 2 occasions in the last week or so. He had to delay f/u for a hospitalization for pneumonia and his wife's illness. He is having some urgency with UUI which is increased. He has no blood in the stream.      ALLERGIES: No Allergies    MEDICATIONS: Ditropan Xl  Amlodipine Besilate  Benicar  Flomax  Klor-Con  Niaspan  Toprol Xl  Tricon  Zocor     GU PSH: Bladder Instill AntiCA Agent - 07/17/2015 Cysto Bladder Ureth Biopsy - 2013, 2013 Cystoscopy TURBT 2-5 cm - 07/17/2015, 2013 Cystoscopy TURP - 2013      PSH Notes: Bladder Injection Of Cancer Treatment, Cystoscopy With Fulguration Medium Lesion (2-5cm), Amputation Of Leg Above Knee, Transurethral Resection Of Prostate (TURP), Cystoscopy With Biopsy, Cystoscopy With Biopsy, Cystoscopy With Fulguration Medium Lesion (2-5cm), No Surgical Problems   NON-GU PSH: Amputate Leg At Thigh - 06/06/2015    GU PMH: Bladder Cancer Trigone , Malignant neoplasm of trigone of bladder - 08/01/2015 Urinary Urgency, Urinary urgency - 08/01/2015 Gross hematuria, Gross hematuria - 06/06/2015 Personal history of malignant neoplasm of bladder, History of bladder cancer - 2015 BPH w/LUTS, Benign prostatic hyperplasia with urinary obstruction - 2014      PMH Notes:  2012-08-11 14:20:22 - Note: Heart Block  2011-09-20 12:53:15 - Note: Arthritis  He was treated for pneumonia in mid July 2017.    NON-GU PMH: Personal history of other diseases of the circulatory system, History of congestive heart disease -  06/06/2015, History of hypertension, - 2014 Acute embolism and thrombosis of unspecified deep veins of unspecified distal lower extremity, Deep vein thrombosis of distal lower extremity - 123456 Chronic systolic (congestive) heart failure, Chronic systolic congestive heart failure - 2014 Personal history of other venous thrombosis and embolism, H/O blood clots - 2014 Gout, unspecified, Gout - 2014 Personal history of other diseases of the nervous system and sense organs, History of glaucoma - 2014 Personal history of other endocrine, nutritional and metabolic disease, History of hypercholesterolemia - 2014 Encounter for general adult medical examination without abnormal findings, Encounter for preventive health examination    FAMILY HISTORY: Death In The Family Father - Runs In Family Death In The Family Mother - Runs In Family Family Health Status Number - Runs In Family   SOCIAL HISTORY: None    Notes: Former smoker, Marital History - Currently Married, Caffeine Use, Alcohol Use   REVIEW OF SYSTEMS:    GU Review Male:   Patient reports hard to postpone urination and leakage of urine. Patient denies burning/ pain with urination, penile pain, have to strain to urinate , get up at night to urinate, frequent urination, trouble starting your stream, erection problems, and stream starts and stops.  Gastrointestinal (Upper):   Patient denies nausea, vomiting, and indigestion/ heartburn.  Gastrointestinal (Lower):   Patient denies diarrhea and constipation.  Constitutional:   Patient denies fever, night sweats, weight loss, and fatigue.  Skin:  Patient denies skin rash/ lesion and itching.  Eyes:   Patient denies blurred vision and double vision.  Ears/ Nose/ Throat:   Patient denies sore throat and sinus problems.  Hematologic/Lymphatic:   Patient denies swollen glands and easy bruising.  Cardiovascular:   Patient denies leg swelling and chest pains.  Respiratory:   Patient denies cough and  shortness of breath.  Endocrine:   Patient denies excessive thirst.  Musculoskeletal:   Patient denies back pain and joint pain.  Neurological:   Patient denies headaches and dizziness.  Psychologic:   Patient denies depression and anxiety.   VITAL SIGNS:      11/14/2015 03:07 PM  BP 143/67 mmHg  Heart Rate 60 /min  Temperature 98.1 F / 37 C   GU PHYSICAL EXAMINATION:    Scrotum: No lesions. No edema. No cysts. No warts.  Epididymides: Right: no spermatocele, no masses, no cysts, no tenderness, no induration, no enlargement. Left: no spermatocele, no masses, no cysts, no tenderness, no induration, no enlargement.  Testes: No tenderness, no swelling, no enlargement left testes. No tenderness, no swelling, no enlargement right testes. Normal location left testes. Normal location right testes. No mass, no cyst, no varicocele, no hydrocele left testes. No mass, no cyst, no varicocele, no hydrocele right testes.  Urethral Meatus: Normal size. No lesion, no wart, no discharge, no polyp. Normal location.  Penis: Circumcised, no warts, no cracks. No dorsal Peyronie's plaques, no left corporal Peyronie's plaques, no right corporal Peyronie's plaques, no scarring, no warts. No balanitis, no meatal stenosis.   MULTI-SYSTEM PHYSICAL EXAMINATION:    Constitutional: Well-nourished. No physical deformities. Normally developed. Good grooming.  Respiratory: No labored breathing, no use of accessory muscles. CTA  Cardiovascular: Normal temperature, normal extremity pulses, no swelling, no varicosities. RRR without murmur.  Gastrointestinal: soft NT. No hernias.   Musculoskeletal: Normal gait and station of head and neck. Left AKA.      PAST DATA REVIEWED:  Source Of History:  Patient  Urine Test Review:   Urinalysis   PROCEDURES:         Flexible Cystoscopy - 52000  Risks, benefits, and some of the potential complications of the procedure were discussed. Cipro 500mg  given for antibiotic prophylaxis.      Meatus:  Normal size. Normal location. Normal condition.  Urethra:  No strictures. There is a 54mm papillary lesion in the mid ventral urethra.   External Sphincter:  Normal.  Verumontanum:  Normal.  Prostate:  Non-obstructing. No hyperplasia. Partial TUR defect.  Bladder Neck:  Non-obstructing.  Ureteral Orifices:  Normal location. Normal size. Normal shape. Effluxed clear urine.  Bladder:  Moderate trabeculation. No tumors. Normal mucosa. No stones.      The procedure was well tolerated and there were no complications.  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   2   Gross hematuria - R31.0 He had urethral bleeding.    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   I am going to get him set up for biopsy and fulguration and reviewed the risks in detail including bleeding, infection, urethral stricture, recurrence, thrombotic events and anesthetic complications.   CC: Dr. Morrie Sheldon.

## 2015-11-29 ENCOUNTER — Encounter (HOSPITAL_COMMUNITY): Payer: Self-pay | Admitting: *Deleted

## 2015-11-29 DIAGNOSIS — Z86711 Personal history of pulmonary embolism: Secondary | ICD-10-CM

## 2015-11-29 DIAGNOSIS — Z8551 Personal history of malignant neoplasm of bladder: Secondary | ICD-10-CM | POA: Diagnosis not present

## 2015-11-29 DIAGNOSIS — I739 Peripheral vascular disease, unspecified: Secondary | ICD-10-CM | POA: Diagnosis not present

## 2015-11-29 DIAGNOSIS — C669 Malignant neoplasm of unspecified ureter: Secondary | ICD-10-CM | POA: Diagnosis not present

## 2015-11-29 DIAGNOSIS — I429 Cardiomyopathy, unspecified: Secondary | ICD-10-CM | POA: Diagnosis not present

## 2015-11-29 DIAGNOSIS — I5032 Chronic diastolic (congestive) heart failure: Secondary | ICD-10-CM | POA: Diagnosis not present

## 2015-11-29 DIAGNOSIS — I110 Hypertensive heart disease with heart failure: Secondary | ICD-10-CM | POA: Diagnosis not present

## 2015-11-29 DIAGNOSIS — I10 Essential (primary) hypertension: Secondary | ICD-10-CM | POA: Diagnosis present

## 2015-11-29 DIAGNOSIS — E785 Hyperlipidemia, unspecified: Secondary | ICD-10-CM | POA: Diagnosis not present

## 2015-11-29 DIAGNOSIS — Z89612 Acquired absence of left leg above knee: Secondary | ICD-10-CM | POA: Diagnosis not present

## 2015-11-29 DIAGNOSIS — I5022 Chronic systolic (congestive) heart failure: Secondary | ICD-10-CM | POA: Diagnosis not present

## 2015-11-29 DIAGNOSIS — Z8261 Family history of arthritis: Secondary | ICD-10-CM | POA: Diagnosis not present

## 2015-11-29 DIAGNOSIS — R001 Bradycardia, unspecified: Secondary | ICD-10-CM | POA: Diagnosis not present

## 2015-11-29 DIAGNOSIS — Z86718 Personal history of other venous thrombosis and embolism: Secondary | ICD-10-CM | POA: Diagnosis not present

## 2015-11-29 DIAGNOSIS — C680 Malignant neoplasm of urethra: Secondary | ICD-10-CM | POA: Diagnosis not present

## 2015-11-29 LAB — PROTIME-INR
INR: 1.32
Prothrombin Time: 16.5 seconds — ABNORMAL HIGH (ref 11.4–15.2)

## 2015-11-29 LAB — TROPONIN I: Troponin I: <0.03 ng/mL (ref <0.03)

## 2015-11-29 MED ORDER — METOPROLOL SUCCINATE ER 50 MG PO TB24: ORAL_TABLET | ORAL | Status: DC

## 2015-11-29 MED ORDER — WARFARIN SODIUM 5 MG PO TABS
5.0000 mg | ORAL_TABLET | Freq: Once | ORAL | Status: AC
  Administered 2015-11-29: 5 mg via ORAL
  Filled 2015-11-29: qty 1

## 2015-11-29 MED ORDER — WARFARIN SODIUM 2.5 MG PO TABS: ORAL_TABLET | ORAL | Status: DC

## 2015-11-29 NOTE — Progress Notes (Signed)
Scott [name] for coumadin Indication: atrial fibrillation, h/o DVT  Allergies  Allergen Reactions  . Lipitor [Atorvastatin] Other (See Comments)    unknown    Patient Measurements: Height: 5\' 7"  (170.2 cm) Weight: 162 lb (73.5 kg) IBW/kg (Calculated) : 66.1  Vital Signs: Temp: 98.2 F (36.8 C) (08/19 0841) Temp Source: Oral (08/19 0841) BP: 125/54 (08/19 0841) Pulse Rate: 51 (08/19 0841)  Labs:  Recent Labs  11/28/15 1511 11/28/15 2046 11/29/15 0302 11/29/15 0611  HGB 12.6*  --   --   --   HCT 38.4*  --   --   --   PLT 153  --   --   --   LABPROT 16.0*  --   --  16.5*  INR 1.27  --   --  1.32  CREATININE 1.06  --   --   --   TROPONINI <0.03 <0.03 <0.03  --     Estimated Creatinine Clearance: 41.6 mL/min (by C-G formula based on SCr of 1.06 mg/dL).   Medical History: Past Medical History:  Diagnosis Date  . Arthritis    "all over"  . Bladder cancer ([place]) 03/15/2012   pt denies this hx on 10/15/2014  . CAD- non obstructive disease by cath 3/14   . Chronic lower back pain   . Chronic systolic CHF (congestive heart failure) ([place])    a. NICM - patent cors 06/2012, EF 40% at that time. b. 03/2013 eval: EF 20-25%.  . Chronic systolic heart failure ([place]) 10/04/14   EF improved 50-55%  . DDD (degenerative disc disease)   . Depression   . DVT (deep venous thrombosis) ([place])    "I think in both legs"  . DVT, bilateral lower limbs ([place]) 03/2013   a. Bilat DVT dx 03/2013.  Marland Kitchen Elevated troponin    a. Adm 12/30-04/2013 - not felt to represent ACS; ?due to PE. b. Patent cors 06/2012.  Marland Kitchen ETOH abuse   . Frequency of urination   . Gangrene of toe ([place]) 10/03/2014  . Glaucoma   . Heart murmur   . HTN (hypertension)   . Hyperlipidemia   . Hypertension   . Nocturia   . NSVT (nonsustained ventricular tachycardia) ([place])    a. NSVT 06/2012; NSVT also seen during 03/2013 adm. b. Med rx. Not candidate for ICD given adv age.  Marland Kitchen PAD (peripheral  artery disease) ([place])   . Pneumonia    "couple times"  . Popliteal artery aneurysm, bilateral (HCC) DECUMENTED CHRONIC PARTIAL OCCLUSION--  PT DENIES CLAUDICATION OR ANY OTHER SYMPTOMS  . Pulmonary embolism ([place])    a. By CT angio 04/2013.  Marland Kitchen PVCs (premature ventricular contractions)   . PVD (peripheral vascular disease) ([facility])    Right ABI .75, Left .78 (2006)  . Sepsis due to other etiology ([place]) 10/03/2014  . Syncope    a. Felt to be postural syncope related to diuretics 06/2012.    Medications:  Prescriptions Prior to Admission  Medication Sig Dispense Refill Last Dose  . donepezil (ARICEPT) 10 MG tablet TAKE ONE TABLET AT BEDTIME 30 tablet 4 11/27/2015 at Unknown time  . folic acid (FOLVITE) 1 MG tablet Take 1 tablet (1 mg total) by mouth daily. 30 tablet 4 11/27/2015 at Unknown time  . furosemide (LASIX) 40 MG tablet TAKE 1 TABLET EVERY DAY 30 tablet 4 11/27/2015 at Unknown time  . gabapentin (NEURONTIN) 100 MG capsule TAKE (2) CAPSULES TWICE DAILY. 120 capsule 0 11/27/2015 at Unknown  time  . hydrocerin (EUCERIN) CREA Apply 1 application topically 2 (two) times daily.  0 11/27/2015 at Unknown time  . isosorbide dinitrate (ISORDIL) 30 MG tablet TAKE 1 TABLET ONCE A DAY 30 tablet 4 11/27/2015 at Unknown time  . metoprolol succinate (TOPROL-XL) 50 MG 24 hr tablet TAKE 1 TABLET DAILY 30 tablet 4 11/27/2015 at Unknown time  . pantoprazole (PROTONIX) 40 MG tablet Take 1 tablet (40 mg total) by mouth daily. 30 tablet 4 11/27/2015 at Unknown time  . sertraline (ZOLOFT) 50 MG tablet TAKE 1 TABLET IN THE MORNING 90 tablet 0 11/27/2015 at Unknown time  . simvastatin (ZOCOR) 10 MG tablet Take 1 tablet (10 mg total) by mouth daily. 30 tablet 6 11/27/2015 at Unknown time  . tamsulosin (FLOMAX) 0.4 MG CAPS capsule TAKE (1) CAPSULE DAILY 30 capsule 6 11/27/2015 at Unknown time  . ULORIC 40 MG tablet TAKE 1 TABLET DAILY 30 tablet 0 11/27/2015 at Unknown time  . warfarin (COUMADIN) 2 MG tablet TAKE 1 TABLET ALONG  WITH 2.5MG  TO EQUAL 4.5 MG DAILY 30 tablet 2 11/23/2015  . warfarin (COUMADIN) 2.5 MG tablet TAKE UP TO 3 TABLETS DAILY AS DIRECTED BY CPP (Patient taking differently: TAKES WITH 2MG  TABLET TO EQUAL 4.5MG  DAILY) 90 tablet 2 11/23/2015  . azithromycin (ZITHROMAX Z-PAK) 250 MG tablet As directed (Patient not taking: Reported on 11/24/2015) 1 each 0 Not Taking at Unknown time  . benzonatate (TESSALON PERLES) 100 MG capsule Take 1 capsule (100 mg total) by mouth 3 (three) times daily as needed for cough. (Patient not taking: Reported on 11/24/2015) 20 capsule 0 Not Taking at Unknown time  . ferrous sulfate 325 (65 FE) MG tablet Take 1 tablet (325 mg total) by mouth daily with breakfast. (Patient not taking: Reported on 11/24/2015) 30 tablet 4 Not Taking at Unknown time  . nitroGLYCERIN (NITROSTAT) 0.4 MG SL tablet Place 1 tablet (0.4 mg total) under the tongue every 5 (five) minutes as needed for chest pain (up to 3 doses). 25 tablet 3 More than a month at Unknown time    Assessment: 82 yo man on coumadin for h/o DVT, afib. Coumadin was on hold PTA for procedure.  He became bradycardic after anesthesia induction and procedure was cancelled. Plan to resume coumadin Goal of Therapy:  INR 2-3 Monitor platelets by anticoagulation protocol: Yes   Plan:  Coumadin 5 mg po today Daily PT/INR  Scott [name] 11/29/2015,9:58 AM

## 2015-11-29 NOTE — Progress Notes (Signed)
Patient walked. HR started at 51 and elevated into the 60s and 80s while ambulating.

## 2015-11-29 NOTE — Discharge Summary (Signed)
Physician Discharge Summary  Scott [name] [id number] DOB: 1921/08/27 DOA: 11/28/2015  PCP: Redge Gainer, MD  Admit date: 11/28/2015 Discharge date: 11/29/2015  Time spent: Greater than 30 minutes  Recommendations for Outpatient Follow-up:  1. The patient was discharged on 25 mg of Toprol-XL (half of 50 mg tablet). He-his son-was instructed to take Toprol-XL daily for heart rate of greater than 60 bpm. He was instructed to follow-up with cardiology as scheduled. Surgical/urological options without anesthesia should be considered and should be discussed with cardiology from a cardiac standpoint.   Discharge Diagnoses:   1. Severe bradycardia secondary anesthesia; in the setting of beta blocker-Toprol-XL. 2. First-degree AV block. 3. Chronic left bundle branch block. 4. History of nonsustained ventricular tachycardia, 2014. 5. Nonischemic cardiomyopathy with nonobstructive CAD by cardiac cath 06/2012. 6. History of pulmonary embolism/DVT in 2014; on chronic Coumadin therapy. 7.Hypertension. 8. Status post left above-the-knee amputation. 9. Urethral tumor.  Discharge Condition: Improved.  Diet recommendation: Heart healthy.  Filed Weights   11/28/15 1018  Weight: 73.5 kg (162 lb)    History of present illness:  Patient is a 82 year old man with a history of in ICM with nonobstructive CAD by cath 06/2012, HTN, chronic left bundle branch block, PVCs/NSVT, DVT/PE on Coumadin, bladder cancer, and urethral tumor, who was admitted following a scheduled urological procedure by Dr. Jeffie Pollock for a urethral biopsy and fulguration. Following intubation of the patient for the procedure, he became severely bradycardic with a heart rate between 20 and 30. He received 2 doses of atropine and his heart rate improved to the 60s. The procedure was canceled. He was then referred for admission.  Hospital Course:  The patient was admitted to telemetry. The follow-up EKG revealed a first-degree AV block and  left bundle branch block. Toprol XL was withheld. He was started on gentle IV fluids. He was restarted on Coumadin. Cardiology was consulted. Dr. Harl Bowie assessed the patient. Per his assessment, the patient's bradycardia quickly reversed with atropine. He noted that the patient was on Toprol-XL and his last dose was the day before. He believed that the combined anesthesia with beta blocker therapy was the likely  etiology of the transient bradycardic episode. He agreed with monitoring the patient on telemetry and holding the Toprol-XL. He advised to start Toprol-XL at a lower dose of 25 mg (patient's home dose was 50 mg) if his heart rate was above 60 bpm. He also advised the patient to follow-up with his primary cardiologist, Dr. Percival Spanish to determine the best surgical options from a cardiac standpoint. Apparently, per urology, there is an option to try the surgery without general anesthesia.  During the hospital course, his troponin I was negative 3. His TSH was within normal limits.  The patient's heart rate during most of the hospitalization ranged in the low 50s to the mid 50s. He was asymptomatic. He was ambulated. With ambulation, his heart rate increased to the 80s. It returned to the 60s at rest. The patient was discharged with instructions to take Toprol-XL 25 mg daily only if his heart rate was above 60 bpm. His son, [name] plans to purchase a pulse ox or a blood pressure machine that would also be able to check his heart rate daily.  Procedures:  None  Consultations:  Cardiology  Discharge Exam: Vitals:   11/29/15 1618 11/29/15 1622  BP:    Pulse: 64 86  Resp:    Temp:     temperature 98.1. Heart rate with ambulation 86. Respiratory rate 18.  Blood pressure 124/53. Oxygen saturation 100%.  General: Pleasant elderly African-American man in no acute distress. Cardiovascular: S1, S2, with bradycardia. Respiratory: Decreased breath sounds in the bases, otherwise clear.   Discharge  Instructions   Discharge Instructions    Diet - low sodium heart healthy    Complete by:  As directed   Discharge instructions    Complete by:  As directed   [place] DECIDING TO TAKE METOPROLOL XL.  TAKE A HALF A TABLET (25 MG) DAILY IF YOUR HEART RATE IS ABOVE 60.   Increase activity slowly    Complete by:  As directed     Current Discharge Medication List    CONTINUE these medications which have CHANGED   Details  metoprolol succinate (TOPROL-XL) 50 MG 24 hr tablet TAKE A HALF A TABLET (25 MG) DAILY IF YOUR HEART RATE IS ABOVE 60. (Take with or immediately following a meal.)    !! warfarin (COUMADIN) 2.5 MG tablet TAKES WITH 2MG  TABLET TO EQUAL 4.5MG  DAILY     !! - Potential duplicate medications found. Please discuss with provider.    CONTINUE these medications which have NOT CHANGED   Details  donepezil (ARICEPT) 10 MG tablet TAKE ONE TABLET AT BEDTIME Qty: 30 tablet, Refills: 4    folic acid (FOLVITE) 1 MG tablet Take 1 tablet (1 mg total) by mouth daily. Qty: 30 tablet, Refills: 4    furosemide (LASIX) 40 MG tablet TAKE 1 TABLET EVERY DAY Qty: 30 tablet, Refills: 4    gabapentin (NEURONTIN) 100 MG capsule TAKE (2) CAPSULES TWICE DAILY. Qty: 120 capsule, Refills: 0    hydrocerin (EUCERIN) CREA Apply 1 application topically 2 (two) times daily. Refills: 0    isosorbide dinitrate (ISORDIL) 30 MG tablet TAKE 1 TABLET ONCE A DAY Qty: 30 tablet, Refills: 4    pantoprazole (PROTONIX) 40 MG tablet Take 1 tablet (40 mg total) by mouth daily. Qty: 30 tablet, Refills: 4    sertraline (ZOLOFT) 50 MG tablet TAKE 1 TABLET IN THE MORNING Qty: 90 tablet, Refills: 0    simvastatin (ZOCOR) 10 MG tablet Take 1 tablet (10 mg total) by mouth daily. Qty: 30 tablet, Refills: 6    tamsulosin (FLOMAX) 0.4 MG CAPS capsule TAKE (1) CAPSULE DAILY Qty: 30 capsule, Refills: 6    ULORIC 40 MG tablet TAKE 1 TABLET DAILY Qty: 30 tablet, Refills: 0    !! warfarin  (COUMADIN) 2 MG tablet TAKE 1 TABLET ALONG WITH 2.5MG  TO EQUAL 4.5 MG DAILY Qty: 30 tablet, Refills: 2    ferrous sulfate 325 (65 FE) MG tablet Take 1 tablet (325 mg total) by mouth daily with breakfast. Qty: 30 tablet, Refills: 4    nitroGLYCERIN (NITROSTAT) 0.4 MG SL tablet Place 1 tablet (0.4 mg total) under the tongue every 5 (five) minutes as needed for chest pain (up to 3 doses). Qty: 25 tablet, Refills: 3     !! - Potential duplicate medications found. Please discuss with provider.    STOP taking these medications     azithromycin (ZITHROMAX Z-PAK) 250 MG tablet      benzonatate (TESSALON PERLES) 100 MG capsule        Allergies  Allergen Reactions  . Lipitor [Atorvastatin] Other (See Comments)    unknown   Follow-up Information    Jory Sims, NP. Go on 12/16/2015.   Specialties:  Nurse Practitioner, Radiology, Cardiology Why:  12/16/15 @ 1:30pm Contact information: [place] [address]  [id number]        Minus Breeding, MD .   Specialty:  Cardiology Contact information: [place] [address] [phone]            The results of significant diagnostics from this hospitalization (including imaging, microbiology, ancillary and laboratory) are listed below for reference.    Significant Diagnostic Studies: No results found.  Microbiology: Recent Results (from the past 240 hour(s))  Surgical pcr screen     Status: None   Collection Time: 11/24/15 11:40 AM  Result Value Ref Range Status   MRSA, PCR NEGATIVE NEGATIVE Final   Staphylococcus aureus NEGATIVE NEGATIVE Final    Comment:        The Xpert SA Assay (FDA approved for NASAL specimens in patients over 58 years of age), is one component of a comprehensive surveillance program.  Test performance has been validated by [facility] for patients greater than or equal to 30 year old. It is not intended to diagnose infection nor to guide or monitor treatment.       Labs: Basic Metabolic Panel:  Recent Labs Lab 11/24/15 1140 11/28/15 1511  NA 137 137  K 3.8 4.1  CL 107 104  CO2 26 29  GLUCOSE 105* 87  BUN 37* 20  CREATININE 1.22 1.06  CALCIUM 9.2 9.0  MG  --  1.9  PHOS  --  3.1   Liver Function Tests:  Recent Labs Lab 11/28/15 1511  AST 20  ALT 13*  ALKPHOS 158*  BILITOT 0.6  PROT 6.9  ALBUMIN 3.6   No results for input(s): LIPASE, AMYLASE in the last 168 hours. No results for input(s): AMMONIA in the last 168 hours. CBC:  Recent Labs Lab 11/24/15 1140 11/28/15 1511  WBC 4.2 3.4*  NEUTROABS 2.6 1.9  HGB 12.1* 12.6*  HCT 36.7* 38.4*  MCV 82.8 82.9  PLT 164 153   Cardiac Enzymes:  Recent Labs Lab 11/28/15 1511 11/28/15 2046 11/29/15 0302  TROPONINI <0.03 <0.03 <0.03   BNP: BNP (last 3 results) No results for input(s): BNP in the last 8760 hours.  ProBNP (last 3 results) No results for input(s): PROBNP in the last 8760 hours.  CBG: No results for input(s): GLUCAP in the last 168 hours.     Signed:  Phyillis Dascoli MD.  [facility] 11/29/2015, 4:42 PM

## 2015-11-30 LAB — HEMOGLOBIN A1C
Hgb A1c MFr Bld: 5.8 % — ABNORMAL HIGH (ref 4.8–5.6)
Mean Plasma Glucose: 120 mg/dL

## 2015-12-03 ENCOUNTER — Other Ambulatory Visit: Payer: Self-pay | Admitting: Family

## 2015-12-05 ENCOUNTER — Other Ambulatory Visit: Payer: Self-pay | Admitting: Family Medicine

## 2015-12-08 ENCOUNTER — Telehealth: Payer: Self-pay | Admitting: Cardiology

## 2015-12-08 NOTE — Telephone Encounter (Signed)
New Message  Pt  son call requesting to speak with RN to f/u on surgical clearance that he states was send from Dr. Ralene Muskrat office. Please call back to discuss

## 2015-12-08 NOTE — Telephone Encounter (Signed)
Returned call to son (ok per DPR).  Son wanting to know if we received a surgical clearance from MD Denton Lank office.  Unable to locate recent surgical clearance request-last received 05/2015.  Son states he will call [facility] office to request it to be sent over to our office for MD Hochrein to review.  Verbalized understanding.

## 2015-12-09 ENCOUNTER — Telehealth: Payer: Self-pay | Admitting: Cardiology

## 2015-12-09 ENCOUNTER — Other Ambulatory Visit: Payer: Self-pay | Admitting: Family Medicine

## 2015-12-09 NOTE — Telephone Encounter (Signed)
Request for surgical clearance:  1. What type of surgery is being performed? Bladder Biopsy and Fulguration of Ureteral Tumor   2. When is this surgery scheduled?No date scheduled   3. Are there any medications that need to be held prior to surgery and how long?Aspirin can they stop and how long and just general cardiac clearance 4. Name of physician performing surgery? Dr [name]   5. What is your office phone and fax number? [phone] and fax is [phone]

## 2015-12-11 ENCOUNTER — Telehealth: Payer: Self-pay | Admitting: Adult Health

## 2015-12-11 NOTE — Telephone Encounter (Signed)
Dr Denice Bors called requesting about appointment scheduled.  Stated that he asked for someone to contact him before making it.  Please call him back.

## 2015-12-11 NOTE — Telephone Encounter (Signed)
Clearance faxed to Dr Jeffie Pollock of via Epic and faxed Machine

## 2015-12-11 NOTE — Telephone Encounter (Signed)
The patient had no high risk features or findings other than his age and multiple medical problems when I saw him previously.  He can hold his warfarin for 5 days prior to the planned bladder procedure.  No further cardiovascular testing is necessary before his urologic procedure.

## 2015-12-16 ENCOUNTER — Ambulatory Visit (INDEPENDENT_AMBULATORY_CARE_PROVIDER_SITE_OTHER): Payer: Medicare Other | Admitting: Adult Health

## 2015-12-16 ENCOUNTER — Encounter: Payer: Self-pay | Admitting: Adult Health

## 2015-12-16 VITALS — BP 106/58 | HR 62 | Ht 67.0 in

## 2015-12-16 DIAGNOSIS — I4891 Unspecified atrial fibrillation: Secondary | ICD-10-CM | POA: Diagnosis not present

## 2015-12-16 DIAGNOSIS — I429 Cardiomyopathy, unspecified: Secondary | ICD-10-CM | POA: Diagnosis not present

## 2015-12-16 DIAGNOSIS — I428 Other cardiomyopathies: Secondary | ICD-10-CM

## 2015-12-16 DIAGNOSIS — R001 Bradycardia, unspecified: Secondary | ICD-10-CM

## 2015-12-16 NOTE — Progress Notes (Signed)
Cardiology Office Note   Date:  12/16/2015   ID:  Scott [name], DOB June 11, 1921, MRN [id number]  PCP:  Redge Gainer, MD  Cardiologist:  Hochrein/  Jory Sims, NP   Chief Complaint  Patient presents with  . Cardiomyopathy  . Atrial Fibrillation      History of Present Illness: Scott [name] is a 82 y.o. male who presents for ongoing assessment and management of nonischemic cardiopathy, nonobstructive CAD by cath in March 2014, chronic left bundle branch block, hypertension, history of EtOH abuse, PVCs, and nonsustained ventricular tachycardia. Other history includes atrial fibrillation on Coumadin.  He is scheduled to have bladder procedure and has been advised to hold Coumadin for 5 days per telephone note by Dr. Percival Spanish on 12/11/2015. No further cardiovascular testing was necessary before his urologic procedure.  10/04/2014 Left ventricle: Abnormal septal motion The cavity size was   normal. There was mild concentric hypertrophy. Systolic function   was normal. The estimated ejection fraction was in the range of   50% to 55%. Doppler parameters are consistent with abnormal left   ventricular relaxation (grade 1 diastolic dysfunction). - Aortic valve: There was trivial regurgitation. - Mitral valve: Calcified annulus.  The patient was recently discharged on 11/29/2015 after undergoing urethral biopsy,and fulguration. Following intubation of the patient for the procedure he became severely bradycardic with a heart rate between 20 and 30 bpm. He received 2 doses of atropine and his heart rate improved into the 60s. The procedure was canceled and he was referred for admission. Metoprolol was held during hospitalization and he was restarted on metoprolol at 25 mg daily decreased from 50 mg daily.  He comes here today with his son. He has not yet been scheduled for his biopsy. A letter was faxed to Dr.Wrenn on August 31 to clear him for surgery, but apparently this was not received. The  patient has not had any changes in his current status with decreased metoprolol. He is very sedentary staying in a wheelchair most of the day. Coumadin is managed by Dr. Laurance Flatten.    Past Medical History:  Diagnosis Date  . Arthritis    "all over"  . Bladder cancer ([place]) 03/15/2012   pt denies this hx on 10/15/2014  . Bradycardia 11/28/2015   Severe-HR in the 20s to 30s following intubation for urological procedure.  Marland Kitchen CAD- non obstructive disease by cath 3/14   . Chronic lower back pain   . Chronic systolic CHF (congestive heart failure) ([place])    a. NICM - patent cors 06/2012, EF 40% at that time. b. 03/2013 eval: EF 20-25%.  . Chronic systolic heart failure ([place]) 10/04/14   EF improved 50-55%  . DDD (degenerative disc disease)   . Depression   . DVT (deep venous thrombosis) ([place])    "I think in both legs"  . DVT, bilateral lower limbs ([place]) 03/2013   a. Bilat DVT dx 03/2013.  Marland Kitchen Elevated troponin    a. Adm 12/30-04/2013 - not felt to represent ACS; ?due to PE. b. Patent cors 06/2012.  Marland Kitchen ETOH abuse   . Frequency of urination   . Gangrene of toe ([place]) 10/03/2014  . Glaucoma   . Heart murmur   . HTN (hypertension)   . Hyperlipidemia   . Hypertension   . Nocturia   . NSVT (nonsustained ventricular tachycardia) ([place])    a. NSVT 06/2012; NSVT also seen during 03/2013 adm. b. Med rx. Not candidate for ICD given adv age.  Marland Kitchen PAD (  peripheral artery disease) ([place])   . Pneumonia    "couple times"  . Popliteal artery aneurysm, bilateral ([facility]) DECUMENTED CHRONIC PARTIAL OCCLUSION--  PT DENIES CLAUDICATION OR ANY OTHER SYMPTOMS  . Pulmonary embolism ([place])    a. By CT angio 04/2013.  Marland Kitchen PVCs (premature ventricular contractions)   . PVD (peripheral vascular disease) ([facility])    Right ABI .75, Left .78 (2006)  . Sepsis due to other etiology ([place]) 10/03/2014  . Syncope    a. Felt to be postural syncope related to diuretics 06/2012.    Past Surgical History:  Procedure Laterality Date  . AMPUTATION Left  10/16/2014   Procedure: AMPUTATION ABOVE KNEE- LEFT;  Surgeon: Mal Misty, MD;  Location: [facility];  Service: Vascular;  Laterality: Left;  . CARDIAC CATHETERIZATION  05-11-2004  [facility]   MINIMAL CORANARY PLAQUE/ NORMAL LVF/ EF 55%/  NON-OBSTRUCTIVE LAD 25%  . CARDIAC CATHETERIZATION  06/2012  . CARDIOVASCULAR STRESS TEST  10-15-2010   LOW RISK NUCLEAR STUDY/ NO EVIDENCE OF ISCHEMIA/ NORMAL EF  . CATARACT EXTRACTION W/ INTRAOCULAR LENS  IMPLANT, BILATERAL Bilateral   . CYSTOSCOPY  10/07/2011   Procedure: CYSTOSCOPY;  Surgeon: Malka So, MD;  Location: [facility];  Service: Urology;  Laterality: N/A;  . CYSTOSCOPY WITH BIOPSY  03/14/2012   Procedure: CYSTOSCOPY WITH BIOPSY;  Surgeon: Malka So, MD;  Location: [facility];  Service: Urology;  Laterality: N/A;  WITH FULGURATION  . EYE SURGERY    . SHOULDER SURGERY  1970's   "don't remember which side or what kind of OR"  . TONSILLECTOMY    . TRANSTHORACIC ECHOCARDIOGRAM  09-22-2010   MODERATE LVH/ EF 123456 GRADE I DIASTOLIC DYSFUNCTION/ AORTIC SCLEROSIS WITHOUT STENOSIS/  RV  SYSTOLIC  MILDLY REDUCED FUNCTION  . TRANSURETHRAL RESECTION OF BLADDER TUMOR  10/07/2011   Procedure: TRANSURETHRAL RESECTION OF BLADDER TUMOR (TURBT);  Surgeon: Malka So, MD;  Location[facility];  Service: Urology;  Laterality: N/A;  . TRANSURETHRAL RESECTION OF BLADDER TUMOR N/A 07/10/2015   Procedure: TRANSURETHRAL RESECTION OF BLADDER TUMOR (TURBT), CYSTOSCOPY ;  Surgeon: [name], MD;  Location: [facility];  Service: Urology;  Laterality: N/A;  . TRANSURETHRAL RESECTION OF PROSTATE  03/14/2012   Procedure: TRANSURETHRAL RESECTION OF THE PROSTATE WITH GYRUS INSTRUMENTS;  Surgeon: Malka So, MD;  Location: [facility];  Service: Urology;  Laterality: N/A;     Current Outpatient Prescriptions  Medication Sig Dispense Refill  . donepezil (ARICEPT) 10 MG tablet TAKE ONE TABLET AT BEDTIME 30 tablet 4  . ferrous sulfate 325 (65 FE) MG  tablet Take 1 tablet (325 mg total) by mouth daily with breakfast. 30 tablet 5  . folic acid (FOLVITE) 1 MG tablet Take 1 tablet (1 mg total) by mouth daily. 30 tablet 4  . furosemide (LASIX) 40 MG tablet TAKE 1 TABLET EVERY DAY 30 tablet 4  . gabapentin (NEURONTIN) 100 MG capsule TAKE (2) CAPSULES TWICE DAILY. 120 capsule 0  . hydrocerin (EUCERIN) CREA Apply 1 application topically 2 (two) times daily.  0  . isosorbide dinitrate (ISORDIL) 30 MG tablet TAKE 1 TABLET ONCE A DAY 30 tablet 4  . metoprolol succinate (TOPROL-XL) 50 MG 24 hr tablet TAKE A HALF A TABLET (25 MG) DAILY IF YOUR HEART RATE IS ABOVE 60. (Take with or immediately following a meal.)    . nitroGLYCERIN (NITROSTAT) 0.4 MG SL tablet Place 1 tablet (0.4 mg total) under the tongue every 5 (five) minutes  as needed for chest pain (up to 3 doses). 25 tablet 3  . pantoprazole (PROTONIX) 40 MG tablet Take 1 tablet (40 mg total) by mouth daily. 30 tablet 4  . sertraline (ZOLOFT) 50 MG tablet TAKE 1 TABLET IN THE MORNING 90 tablet 0  . simvastatin (ZOCOR) 10 MG tablet Take 1 tablet (10 mg total) by mouth daily. 30 tablet 6  . tamsulosin (FLOMAX) 0.4 MG CAPS capsule TAKE (1) CAPSULE DAILY 30 capsule 6  . ULORIC 40 MG tablet TAKE 1 TABLET DAILY 30 tablet 3  . warfarin (COUMADIN) 2 MG tablet TAKE 1 TABLET ALONG WITH 2.5MG  TO EQUAL 4.5 MG DAILY 30 tablet 2  . warfarin (COUMADIN) 2.5 MG tablet TAKES WITH 2MG  TABLET TO EQUAL 4.5MG  DAILY     No current facility-administered medications for this visit.     Allergies:   Lipitor [atorvastatin]    Social History:  The patient  reports that he quit smoking about 40 years ago. His smoking use included Cigarettes. He started smoking about 73 years ago. He has a 20.00 pack-year smoking history. He has never used smokeless tobacco. He reports that he does not drink alcohol or use drugs.   Family History:  The patient's family history includes Arthritis in his mother; Deep vein thrombosis in his  father; Early death in his brother; Hypertension in his mother; Lung cancer in his sister.    ROS: All other systems are reviewed and negative. Unless otherwise mentioned in H&P    PHYSICAL EXAM: VS:  BP (!) 106/58   Pulse 62   Ht 5\' 7"  (1.702 m)   SpO2 92%  , BMI There is no height or weight on file to calculate BMI. GEN: Well nourished, well developed, in no acute distress  HEENT: normal  Neck: no JVD, carotid bruits, or masses Cardiac: RRR; bradycardic, no murmurs, rubs, or gallops,no edema  Respiratory:  Clear to auscultation bilaterally, normal work of breathing GI: soft, nontender, nondistended, + BS MS: no deformity or atrophy  Skin: warm and dry, no rash Neuro:  Strength and sensation are intact Psych: euthymic mood, full affect   EKG:  The ekg ordered today demonstrates Sinus bradycardia with intraventricular conduction delay. T-wave inversion in the inferior lateral leads. More prominent than prior EKG.    Recent Labs: 11/28/2015: ALT 13; BUN 20; Creatinine, Ser 1.06; Hemoglobin 12.6; Magnesium 1.9; Platelets 153; Potassium 4.1; Sodium 137; TSH 1.387    Lipid Panel    Component Value Date/Time   CHOL 146 07/02/2014 1119   TRIG 183 (H) 07/02/2014 1119   TRIG 194 (H) 03/26/2014 1446   HDL 39 (L) 07/02/2014 1119   HDL 35 (L) 03/26/2014 1446   CHOLHDL 3.7 07/02/2014 1119   CHOLHDL 2.6 11/02/2012 1310   VLDL 44 (H) 11/02/2012 1310   LDLCALC 70 07/02/2014 1119   LDLCALC 42 05/17/2013 1101      Wt Readings from Last 3 Encounters:  11/28/15 162 lb (73.5 kg)  10/30/15 162 lb (73.5 kg)  07/10/15 151 lb (68.5 kg)       ASSESSMENT AND PLAN:  1.  Bradycardia: Likely vasovagal in the setting of anesthesia. The Toprol was decreased to 25 mg daily. He remains bradycardic with intraventricular conduction delay. He does have prominent T-wave inversion inferior laterally. EKG. This is more prominent than on last EKG on 11/29/2015. Would continue low-dose metoprolol  perioperatively. Can consider alternative anesthesia for bladder tumor biopsy to include spinal block. We'll defer to surgeon and anesthesia for  their choices.  2. Atrial fibrillation: Heart rate is well controlled. He remains on Coumadin therapy which is being monitored by primary care physician Dr. Laurance Flatten. It is recommended did that he stop Coumadin 5 days prior to his biopsy and potential surgery. His surgery date has not been scheduled yet. Would recommend INR between less than 2.0-1.5 prior to undergoing procedure. Labs per surgery. A letter has been sent to Dr. Jeffie Pollock with a copy provided to the patient's son.  3. Nonischemic cardiomyopathy: Normal ejection fraction of 50-55% with grade I diastolic dysfunction. Continue current medication regimen. This includes Lasix daily.   4. Deconditioning: He is advised to wear his prosthetic leg on the left and try and get up and move more inset of staying in a wheelchair all the time to help to keep him from feeling so weak and tired. His son is concerned about this. I have encouraged the patient to do a little more each day to prevent him from losing all of his muscle strength.  Current medicines are reviewed at length with the patient today.    Labs/ tests ordered today include:   Orders Placed This Encounter  Procedures  . EKG 12-Lead     Disposition:   FU with  3 months.  Signed, Jory Sims, NP  12/16/2015 4:14 PM    [facility] [address] [place], [place] [address] Phone: [phone]; Fax: [phone]

## 2015-12-16 NOTE — Progress Notes (Signed)
Name: Scott [name]    DOB: Mar 16, 1922  Age: 82 y.o.  MR#: [id number]       PCP:  Redge Gainer, MD      Insurance: Payor: MEDICARE / Plan: MEDICARE PART A AND B / Product Type: *No Product type* /   CC:   No chief complaint on file.   VS Vitals:   12/16/15 1342  BP: (!) 106/58  Pulse: 62  SpO2: 92%  Height: 5\' 7"  (1.702 m)    Weights Current Weight  11/28/15 162 lb (73.5 kg)  10/30/15 162 lb (73.5 kg)  07/10/15 151 lb (68.5 kg)    Blood Pressure  BP Readings from Last 3 Encounters:  12/16/15 (!) 106/58  11/29/15 (!) 124/53  10/30/15 (!) 165/83     Admit date:  (Not on file) Last encounter with RMR:  12/11/2015   Allergy Lipitor [atorvastatin]  Current Outpatient Prescriptions  Medication Sig Dispense Refill  . donepezil (ARICEPT) 10 MG tablet TAKE ONE TABLET AT BEDTIME 30 tablet 4  . ferrous sulfate 325 (65 FE) MG tablet Take 1 tablet (325 mg total) by mouth daily with breakfast. 30 tablet 5  . folic acid (FOLVITE) 1 MG tablet Take 1 tablet (1 mg total) by mouth daily. 30 tablet 4  . furosemide (LASIX) 40 MG tablet TAKE 1 TABLET EVERY DAY 30 tablet 4  . gabapentin (NEURONTIN) 100 MG capsule TAKE (2) CAPSULES TWICE DAILY. 120 capsule 0  . hydrocerin (EUCERIN) CREA Apply 1 application topically 2 (two) times daily.  0  . isosorbide dinitrate (ISORDIL) 30 MG tablet TAKE 1 TABLET ONCE A DAY 30 tablet 4  . metoprolol succinate (TOPROL-XL) 50 MG 24 hr tablet TAKE A HALF A TABLET (25 MG) DAILY IF YOUR HEART RATE IS ABOVE 60. (Take with or immediately following a meal.)    . nitroGLYCERIN (NITROSTAT) 0.4 MG SL tablet Place 1 tablet (0.4 mg total) under the tongue every 5 (five) minutes as needed for chest pain (up to 3 doses). 25 tablet 3  . pantoprazole (PROTONIX) 40 MG tablet Take 1 tablet (40 mg total) by mouth daily. 30 tablet 4  . sertraline (ZOLOFT) 50 MG tablet TAKE 1 TABLET IN THE MORNING 90 tablet 0  . simvastatin (ZOCOR) 10 MG tablet Take 1 tablet (10 mg total) by mouth  daily. 30 tablet 6  . tamsulosin (FLOMAX) 0.4 MG CAPS capsule TAKE (1) CAPSULE DAILY 30 capsule 6  . ULORIC 40 MG tablet TAKE 1 TABLET DAILY 30 tablet 3  . warfarin (COUMADIN) 2 MG tablet TAKE 1 TABLET ALONG WITH 2.5MG  TO EQUAL 4.5 MG DAILY 30 tablet 2  . warfarin (COUMADIN) 2.5 MG tablet TAKES WITH 2MG  TABLET TO EQUAL 4.5MG  DAILY     No current facility-administered medications for this visit.     Discontinued Meds:   There are no discontinued medications.  Patient Active Problem List   Diagnosis Date Noted  . Essential hypertension 11/29/2015  . Bradycardia 11/28/2015  . Malignant neoplasm of lateral wall of urinary bladder ([place]) 07/10/2015  . Status post above knee amputation of left lower extremity ([place]) 10/18/2014  . Tachyarrhythmia 10/05/2014  . Aspiration pneumonia ([place]) 10/05/2014  . UTI (lower urinary tract infection) 10/03/2014  . Sepsis due to other etiology ([place]) 10/03/2014  . Gangrene of toe ([place]) 10/03/2014  . Ischemic ulcer of left foot ([place]) 10/03/2014  . Atherosclerotic PVD with ulceration ([place]) 10/03/2014  . Warfarin-induced coagulopathy ([place]) 10/03/2014  . Atrial fibrillation with RVR ([place]) 10/03/2014  .  Acute kidney injury ([place]) 10/03/2014  . Hyperkalemia 10/03/2014  . Cellulitis of left leg 10/03/2014  . PAD (peripheral artery disease) ([place]) 09/24/2014  . NSTEMI (non-ST elevated myocardial infarction) ([place]) 10/09/2013  . High risk medication use 05/17/2013  . BPH (benign prostatic hyperplasia) 05/17/2013  . Vitamin D deficiency 05/17/2013  . History of pulmonary embolism (2014) 04/17/2013  . NSVT (nonsustained ventricular tachycardia)- not an ICD candidate 04/17/2013  . NICM (nonischemic cardiomyopathy)- EF 20-25% by Echo 04/12/13 04/17/2013  . Chronic diastolic heart failure, NYHA class 1 ([place]) 03/15/2013  . DVT of lower extremity, bilateral ([place]) 03/15/2013  . Fatigue 11/02/2012  . Constipation 11/02/2012  . Alcohol abuse 08/27/2012  . Syncope  06/21/2012  . Bladder cancer ([place]) 03/15/2012  . Benign localized hyperplasia of prostate with urinary obstruction 03/15/2012  . Benign essential HTN   . Hyperlipidemia   . PVCs (premature ventricular contractions)   . CAD- non obstructive disease by cath 3/14     LABS    Component Value Date/Time   NA 137 11/28/2015 1511   NA 137 11/24/2015 1140   NA 141 07/10/2015 0956   NA 141 01/21/2015 1603   NA 143 01/14/2015 1248   NA 144 12/18/2014 1259   K 4.1 11/28/2015 1511   K 3.8 11/24/2015 1140   K 4.1 07/10/2015 0956   CL 104 11/28/2015 1511   CL 107 11/24/2015 1140   CL 105 07/10/2015 0956   CO2 29 11/28/2015 1511   CO2 26 11/24/2015 1140   CO2 28 07/10/2015 0956   GLUCOSE 87 11/28/2015 1511   GLUCOSE 105 (H) 11/24/2015 1140   GLUCOSE 88 07/10/2015 0956   BUN 20 11/28/2015 1511   BUN 37 (H) 11/24/2015 1140   BUN 28 (H) 07/10/2015 0956   BUN 20 01/21/2015 1603   BUN 16 01/14/2015 1248   BUN 21 12/18/2014 1259   CREATININE 1.06 11/28/2015 1511   CREATININE 1.22 11/24/2015 1140   CREATININE 1.20 07/10/2015 0956   CREATININE 0.91 11/02/2012 1310   CREATININE 0.88 08/17/2012 1141   CREATININE 1.13 07/25/2012 1255   CALCIUM 9.0 11/28/2015 1511   CALCIUM 9.2 11/24/2015 1140   CALCIUM 9.7 07/10/2015 0956   GFRNONAA 59 (L) 11/28/2015 1511   GFRNONAA 50 (L) 11/24/2015 1140   GFRNONAA 51 (L) 07/10/2015 0956   GFRNONAA 74 11/02/2012 1310   GFRNONAA 77 08/17/2012 1141   GFRNONAA 58 (L) 07/25/2012 1255   GFRAA >60 11/28/2015 1511   GFRAA 57 (L) 11/24/2015 1140   GFRAA 59 (L) 07/10/2015 0956   GFRAA 86 11/02/2012 1310   GFRAA 89 08/17/2012 1141   GFRAA 67 07/25/2012 1255   CMP     Component Value Date/Time   NA 137 11/28/2015 1511   NA 141 01/21/2015 1603   K 4.1 11/28/2015 1511   CL 104 11/28/2015 1511   CO2 29 11/28/2015 1511   GLUCOSE 87 11/28/2015 1511   BUN 20 11/28/2015 1511   BUN 20 01/21/2015 1603   CREATININE 1.06 11/28/2015 1511   CREATININE 0.91  11/02/2012 1310   CALCIUM 9.0 11/28/2015 1511   PROT 6.9 11/28/2015 1511   PROT 6.8 12/18/2014 1259   ALBUMIN 3.6 11/28/2015 1511   ALBUMIN 4.2 12/18/2014 1259   AST 20 11/28/2015 1511   ALT 13 (L) 11/28/2015 1511   ALKPHOS 158 (H) 11/28/2015 1511   BILITOT 0.6 11/28/2015 1511   BILITOT 0.2 12/18/2014 1259   GFRNONAA 59 (L) 11/28/2015 1511   GFRNONAA 74 11/02/2012 1310  GFRAA >60 11/28/2015 1511   GFRAA 86 11/02/2012 1310       Component Value Date/Time   WBC 3.4 (L) 11/28/2015 1511   WBC 4.2 11/24/2015 1140   WBC 4.3 07/10/2015 0956   HGB 12.6 (L) 11/28/2015 1511   HGB 12.1 (L) 11/24/2015 1140   HGB 12.9 (L) 07/10/2015 0956   HGB 11.5 (L) 01/13/2011 1349   HCT 38.4 (L) 11/28/2015 1511   HCT 36.7 (L) 11/24/2015 1140   HCT 38.5 (L) 07/10/2015 0956   HCT 36.5 (L) 05/19/2015 1225   HCT 33.3 (L) 01/14/2015 1248   HCT 36.8 (L) 12/18/2014 1259   HCT 33.7 (L) 01/13/2011 1349   MCV 82.9 11/28/2015 1511   MCV 82.8 11/24/2015 1140   MCV 80.7 07/10/2015 0956   MCV 80 05/19/2015 1225   MCV 81 01/14/2015 1248   MCV 85 12/18/2014 1259   MCV 94.3 01/13/2011 1349    Lipid Panel     Component Value Date/Time   CHOL 146 07/02/2014 1119   TRIG 183 (H) 07/02/2014 1119   TRIG 194 (H) 03/26/2014 1446   HDL 39 (L) 07/02/2014 1119   HDL 35 (L) 03/26/2014 1446   CHOLHDL 3.7 07/02/2014 1119   CHOLHDL 2.6 11/02/2012 1310   VLDL 44 (H) 11/02/2012 1310   LDLCALC 70 07/02/2014 1119   LDLCALC 42 05/17/2013 1101    ABG    Component Value Date/Time   TCO2 23 10/07/2011 1155     Lab Results  Component Value Date   TSH 1.387 11/28/2015   BNP (last 3 results) No results for input(s): BNP in the last 8760 hours.  ProBNP (last 3 results) No results for input(s): PROBNP in the last 8760 hours.  Cardiac Panel (last 3 results) No results for input(s): CKTOTAL, CKMB, TROPONINI, RELINDX in the last 72 hours.  Iron/TIBC/Ferritin/ %Sat No results found for: IRON, TIBC, FERRITIN,  IRONPCTSAT   EKG Orders placed or performed during the hospital encounter of 11/28/15  . EKG 12-Lead  . EKG 12-Lead  . EKG 12-Lead  . EKG 12-Lead  . EKG 12-Lead  . EKG 12-Lead  . EKG 12-Lead  . EKG 12-Lead     Prior Assessment and Plan Problem List as of 12/16/2015 Reviewed: 10/30/2015 10:54 AM by Chevis Pretty, FNP     Cardiovascular and Mediastinum   CAD- non obstructive disease by cath 3/14   DVT of lower extremity, bilateral (HCC)   NSVT (nonsustained ventricular tachycardia)- not an ICD candidate   Last Assessment & Plan 09/10/2013 Office Visit Written 09/10/2013 11:30 AM by Imogene Burn, PA-C    Patient has nonsustained V. tach with recent Holter monitor documenting 40 beat run of V. tach. Patient is having 2-3 episodes of syncope per week. The son would like him to be considered for possible defibrillator. The patient returns 89 tomorrow. The patient will think about this and they will talk to Dr. Rayann Heman on Friday.      NICM (nonischemic cardiomyopathy)- EF 20-25% by Echo 04/12/13   Last Assessment & Plan 09/10/2013 Office Visit Written 09/10/2013 11:31 AM by Imogene Burn, PA-C    Patient's blood pressure is elevated today and he has mild edema. Recommend 2 g sodium diet. Increase Lasix for 3 days.      Benign essential HTN   Last Assessment & Plan 09/10/2013 Office Visit Written 09/10/2013 11:32 AM by Imogene Burn, PA-C    Caregiver states patient's blood pressure is usually 157/90 in the morning but  then is much better in the evening. All increase his metoprolol to 50 mg twice a day. They will keep track of his pulse and blood pressure and bring them to the office visit on Friday.      PVCs (premature ventricular contractions)   Last Assessment & Plan 10/08/2010 Office Visit Written 10/08/2010 12:14 PM by Minus Breeding, MD    The patient does have frequent PVCs. He has nonsustained ventricular tachycardia and had nonobstructive coronary disease in 2006. At this point I  will plan stress testing though he would not even exercise. If he has a normal perfusion study no further testing will be indicated for treatment as he is asymptomatic.      Syncope   Last Assessment & Plan 09/10/2013 Office Visit Edited 09/10/2013 11:16 AM by Imogene Burn, PA-C    Patient is having recurrent syncope with fall with documented ventricular tachycardia on recent Holter monitor. I had a long discussion with the patient, caregiver and grandson concerning his syncope and risk of sudden cardiac death with an ejection fraction of 25%. I discussed possible defibrillator as well as end-of-life issues as he turns 89 tomorrow. He will think about the defibrillator and talk to his family. I've scheduled him an appointment with Dr. Rayann Heman on Friday to further discuss. Risk of Coumadin with recurrent falls also needs to be addressed.      Chronic diastolic heart failure, NYHA class 1 [facility])   Last Assessment & Plan 09/10/2013 Office Visit Edited 09/10/2013 11:15 AM by Imogene Burn, PA-C    Patient had extra fluid on board today. Increase Lasix to 40 mg daily for 3 days. We will give him a 2 g sodium diet. He has several cooks coming in different days of the week and is getting excess of sodium in his diet.      NSTEMI (non-ST elevated myocardial infarction) [facility])   PAD (peripheral artery disease) ([place])   Atherosclerotic PVD with ulceration (HCC)   Atrial fibrillation with RVR ([place])   Tachyarrhythmia   Essential hypertension     Respiratory   Aspiration pneumonia [facility])     Digestive   Constipation     Genitourinary   Bladder cancer ([place])   Benign localized hyperplasia of prostate with urinary obstruction   Last Assessment & Plan 08/23/2012 Office Visit Written 08/27/2012  3:02 PM by Thompson Grayer, MD    Finasteride may be a better option than tamsulosin given prior postural symptoms.  I have encouraged him to speak with his primary care physician about this in the future.      BPH (benign  prostatic hyperplasia)   UTI (lower urinary tract infection)   Acute kidney injury ([place])   Malignant neoplasm of lateral wall of urinary bladder ([place])     Hematopoietic and Hemostatic   Warfarin-induced coagulopathy ([place])     Other   Hyperlipidemia   Last Assessment & Plan 10/08/2010 Office Visit Written 10/08/2010 12:15 PM by Minus Breeding, MD    This is followed closely by Dr. Laurance Flatten.      Alcohol abuse   Last Assessment & Plan 09/10/2013 Office Visit Written 09/10/2013 11:32 AM by Imogene Burn, PA-C    Caregiver states the patient still drinking alcohol. She could not give me the amount.      Fatigue   History of pulmonary embolism (2014)   High risk medication use   Vitamin D deficiency   Sepsis due to other etiology ([place])   Gangrene of toe (  [place])   Ischemic ulcer of left foot ([place])   Hyperkalemia   Cellulitis of left leg   Status post above knee amputation of left lower extremity (HCC)   Bradycardia       Imaging: No results found.

## 2015-12-16 NOTE — Patient Instructions (Signed)
Your physician recommends that you continue on your current medications as directed. Please refer to the Current Medication list given to you today.   Your physician recommends that you schedule a follow-up appointment in: as needed    Thanks for choosing [facility]!!!    

## 2015-12-16 NOTE — Progress Notes (Signed)
Please call patient and his caregiver with this information that he is cleared for surgery and that he is made aware of the recommendation for his warfarin

## 2015-12-24 ENCOUNTER — Other Ambulatory Visit: Payer: Self-pay | Admitting: Family

## 2015-12-24 ENCOUNTER — Other Ambulatory Visit: Payer: Self-pay | Admitting: Urology

## 2015-12-24 ENCOUNTER — Ambulatory Visit (INDEPENDENT_AMBULATORY_CARE_PROVIDER_SITE_OTHER): Payer: Medicare Other | Admitting: Pharmacist

## 2015-12-24 DIAGNOSIS — Z86711 Personal history of pulmonary embolism: Secondary | ICD-10-CM

## 2015-12-24 DIAGNOSIS — Z7901 Long term (current) use of anticoagulants: Secondary | ICD-10-CM

## 2015-12-24 DIAGNOSIS — Z86718 Personal history of other venous thrombosis and embolism: Secondary | ICD-10-CM | POA: Diagnosis not present

## 2015-12-24 LAB — COAGUCHEK XS/INR WAIVED
INR: 2.7 — ABNORMAL HIGH (ref 0.9–1.1)
Prothrombin Time: 32.8 s

## 2015-12-24 MED ORDER — GABAPENTIN 100 MG PO CAPS: ORAL_CAPSULE | ORAL | 0 refills | Status: DC

## 2015-12-24 NOTE — Addendum Note (Signed)
Addended by: Jamelle Haring on: 12/24/2015 12:40 PM   Modules accepted: Orders

## 2015-12-24 NOTE — Progress Notes (Signed)
Patient is being scheduled for preop .  Need orders signed off on in EPIC.  Thank You!

## 2016-01-02 ENCOUNTER — Encounter (HOSPITAL_COMMUNITY): Payer: Self-pay | Admitting: *Deleted

## 2016-01-07 NOTE — Progress Notes (Signed)
LM with surgery call center for pharmacy to call patient in home nurse to review his meds at his home

## 2016-01-09 ENCOUNTER — Other Ambulatory Visit: Payer: Self-pay | Admitting: Family Medicine

## 2016-01-12 ENCOUNTER — Telehealth: Payer: Self-pay | Admitting: Pharmacist

## 2016-01-12 NOTE — Telephone Encounter (Signed)
Called to verify that Mr. Brindley has been given instructions to hold warfarin prior to biopsy 01/20/16.  Per [name] they are to start holding warfarin 01/15/16.   His nursing assitant will come by with Mr. Ryals 01/18/06 to check INR and verify that INR is low enough for biopsy.

## 2016-01-15 NOTE — Progress Notes (Addendum)
Spoke with pt in regards to medication to take am of surgery; instructed pt to take Gabapentin, Isosorbide, Pantoprazole, Metoprolol if needed  per instruction; Sertraline (Zoloft); pt verbalized understanding of no food or drink after midnight;  Also spoke with nurse in home [facility]) to verify medications pt is to take morning of surgery; This nurse also spoke with pts son Scott [name]) in regards to medications pt is to take am of surgery; pts son also verbalized no food or drink after midnight; All are aware of pt to take small sip of water in am to take am medications instructed on morning of surgery. Scott [name], pt and pts son Scott [name]) aware of surgical date and time.

## 2016-01-19 ENCOUNTER — Ambulatory Visit (INDEPENDENT_AMBULATORY_CARE_PROVIDER_SITE_OTHER): Payer: Medicare Other | Admitting: Pharmacist

## 2016-01-19 ENCOUNTER — Other Ambulatory Visit (INDEPENDENT_AMBULATORY_CARE_PROVIDER_SITE_OTHER): Payer: Medicare Other

## 2016-01-19 DIAGNOSIS — Z86718 Personal history of other venous thrombosis and embolism: Secondary | ICD-10-CM

## 2016-01-19 DIAGNOSIS — Z86711 Personal history of pulmonary embolism: Secondary | ICD-10-CM

## 2016-01-19 DIAGNOSIS — Z7901 Long term (current) use of anticoagulants: Secondary | ICD-10-CM | POA: Diagnosis not present

## 2016-01-19 DIAGNOSIS — Z23 Encounter for immunization: Secondary | ICD-10-CM | POA: Diagnosis not present

## 2016-01-19 DIAGNOSIS — I82423 Acute embolism and thrombosis of iliac vein, bilateral: Secondary | ICD-10-CM

## 2016-01-19 LAB — COAGUCHEK XS/INR WAIVED
INR: 1.5 — ABNORMAL HIGH (ref 0.9–1.1)
Prothrombin Time: 18 s

## 2016-01-19 NOTE — H&P (Signed)
CC/HPI: I am here for a post operative visit.    CC: I have bladder cancer.  HPI: Scott [name] is a 82 year-old male established patient who is here for bladder cancer.  His problem was diagnosed 07/11/2015.   [name] returns today in f/u for his history of bladder cancer that was diagnosed on 3/06/11/15. He had a TURBT with Strum. He was found to have a HG NMIBC that was 4cm and lateral to the right UO. He has had a few drops of blood from the penis on 2 occasions in the last week or so. He had to delay f/u for a hospitalization for pneumonia and his wife's illness. He is having some urgency with UUI which is increased. He has no blood in the stream.      ALLERGIES: No Allergies    MEDICATIONS: Ditropan Xl  Amlodipine Besilate  Benicar  Flomax  Klor-Con  Niaspan  Toprol Xl  Tricon  Zocor     GU PSH: Bladder Instill AntiCA Agent - 07/17/2015 Cysto Bladder Ureth Biopsy - 2013, 2013 Cystoscopy TURBT 2-5 cm - 07/17/2015, 2013 Cystoscopy TURP - 2013      PSH Notes: Bladder Injection Of Cancer Treatment, Cystoscopy With Fulguration Medium Lesion (2-5cm), Amputation Of Leg Above Knee, Transurethral Resection Of Prostate (TURP), Cystoscopy With Biopsy, Cystoscopy With Biopsy, Cystoscopy With Fulguration Medium Lesion (2-5cm), No Surgical Problems   NON-GU PSH: Amputate Leg At Thigh - 06/06/2015    GU PMH: Bladder Cancer Trigone , Malignant neoplasm of trigone of bladder - 08/01/2015 Urinary Urgency, Urinary urgency - 08/01/2015 Gross hematuria, Gross hematuria - 06/06/2015 Personal history of malignant neoplasm of bladder, History of bladder cancer - 2015 BPH w/LUTS, Benign prostatic hyperplasia with urinary obstruction - 2014      PMH Notes:  2012-08-11 14:20:22 - Note: Heart Block  2011-09-20 12:53:15 - Note: Arthritis  He was treated for pneumonia in mid July 2017.    NON-GU PMH: Personal history of other diseases of the circulatory system, History of congestive heart disease -  06/06/2015, History of hypertension, - 2014 Acute embolism and thrombosis of unspecified deep veins of unspecified distal lower extremity, Deep vein thrombosis of distal lower extremity - 123456 Chronic systolic (congestive) heart failure, Chronic systolic congestive heart failure - 2014 Personal history of other venous thrombosis and embolism, H/O blood clots - 2014 Gout, unspecified, Gout - 2014 Personal history of other diseases of the nervous system and sense organs, History of glaucoma - 2014 Personal history of other endocrine, nutritional and metabolic disease, History of hypercholesterolemia - 2014 Encounter for general adult medical examination without abnormal findings, Encounter for preventive health examination    FAMILY HISTORY: Death In The Family Father - Runs In Family Death In The Family Mother - Runs In Family Family Health Status Number - Runs In Family   SOCIAL HISTORY: None    Notes: Former smoker, Marital History - Currently Married, Caffeine Use, Alcohol Use   REVIEW OF SYSTEMS:    GU Review Male:   Patient reports hard to postpone urination and leakage of urine. Patient denies burning/ pain with urination, penile pain, have to strain to urinate , get up at night to urinate, frequent urination, trouble starting your stream, erection problems, and stream starts and stops.  Gastrointestinal (Upper):   Patient denies nausea, vomiting, and indigestion/ heartburn.  Gastrointestinal (Lower):   Patient denies diarrhea and constipation.  Constitutional:   Patient denies fever, night sweats, weight loss, and fatigue.  Skin:  Patient denies skin rash/ lesion and itching.  Eyes:   Patient denies blurred vision and double vision.  Ears/ Nose/ Throat:   Patient denies sore throat and sinus problems.  Hematologic/Lymphatic:   Patient denies swollen glands and easy bruising.  Cardiovascular:   Patient denies leg swelling and chest pains.  Respiratory:   Patient denies cough and  shortness of breath.  Endocrine:   Patient denies excessive thirst.  Musculoskeletal:   Patient denies back pain and joint pain.  Neurological:   Patient denies headaches and dizziness.  Psychologic:   Patient denies depression and anxiety.   VITAL SIGNS:      11/14/2015 03:07 PM  BP 143/67 mmHg  Heart Rate 60 /min  Temperature 98.1 F / 37 C   GU PHYSICAL EXAMINATION:    Scrotum: No lesions. No edema. No cysts. No warts.  Epididymides: Right: no spermatocele, no masses, no cysts, no tenderness, no induration, no enlargement. Left: no spermatocele, no masses, no cysts, no tenderness, no induration, no enlargement.  Testes: No tenderness, no swelling, no enlargement left testes. No tenderness, no swelling, no enlargement right testes. Normal location left testes. Normal location right testes. No mass, no cyst, no varicocele, no hydrocele left testes. No mass, no cyst, no varicocele, no hydrocele right testes.  Urethral Meatus: Normal size. No lesion, no wart, no discharge, no polyp. Normal location.  Penis: Circumcised, no warts, no cracks. No dorsal Peyronie's plaques, no left corporal Peyronie's plaques, no right corporal Peyronie's plaques, no scarring, no warts. No balanitis, no meatal stenosis.   MULTI-SYSTEM PHYSICAL EXAMINATION:    Constitutional: Well-nourished. No physical deformities. Normally developed. Good grooming.  Respiratory: No labored breathing, no use of accessory muscles. CTA  Cardiovascular: Normal temperature, normal extremity pulses, no swelling, no varicosities. RRR without murmur.  Gastrointestinal: soft NT. No hernias.   Musculoskeletal: Normal gait and station of head and neck. Left AKA.      PAST DATA REVIEWED:  Source Of History:  Patient  Urine Test Review:   Urinalysis   PROCEDURES:         Flexible Cystoscopy - 52000  Risks, benefits, and some of the potential complications of the procedure were discussed. Cipro 500mg  given for antibiotic prophylaxis.      Meatus:  Normal size. Normal location. Normal condition.  Urethra:  No strictures. There is a 69mm papillary lesion in the mid ventral urethra.   External Sphincter:  Normal.  Verumontanum:  Normal.  Prostate:  Non-obstructing. No hyperplasia. Partial TUR defect.  Bladder Neck:  Non-obstructing.  Ureteral Orifices:  Normal location. Normal size. Normal shape. Effluxed clear urine.  Bladder:  Moderate trabeculation. No tumors. Normal mucosa. No stones.      The procedure was well tolerated and there were no complications.  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   2   Gross hematuria - R31.0 He had urethral bleeding.    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   I am going to get him set up for biopsy and fulguration and reviewed the risks in detail including bleeding, infection, urethral stricture, recurrence, thrombotic events and anesthetic complications.   CC: Dr. Morrie Sheldon.

## 2016-01-20 ENCOUNTER — Ambulatory Visit (HOSPITAL_COMMUNITY): Payer: Medicare Other | Admitting: Registered Nurse

## 2016-01-20 ENCOUNTER — Ambulatory Visit (HOSPITAL_COMMUNITY)
Admission: RE | Admit: 2016-01-20 | Discharge: 2016-01-20 | Disposition: A | Discharge status: Home / Self Care | Payer: Medicare Other | Source: Ambulatory Visit | Attending: Urology | Admitting: Urology

## 2016-01-20 ENCOUNTER — Encounter (HOSPITAL_COMMUNITY)
Admission: RE | Disposition: A | Discharge status: Home / Self Care | Payer: Self-pay | Source: Ambulatory Visit | Attending: Urology

## 2016-01-20 ENCOUNTER — Encounter (HOSPITAL_COMMUNITY): Payer: Self-pay

## 2016-01-20 DIAGNOSIS — N138 Other obstructive and reflux uropathy: Secondary | ICD-10-CM | POA: Diagnosis not present

## 2016-01-20 DIAGNOSIS — I110 Hypertensive heart disease with heart failure: Secondary | ICD-10-CM | POA: Insufficient documentation

## 2016-01-20 DIAGNOSIS — Z87891 Personal history of nicotine dependence: Secondary | ICD-10-CM | POA: Diagnosis not present

## 2016-01-20 DIAGNOSIS — I351 Nonrheumatic aortic (valve) insufficiency: Secondary | ICD-10-CM | POA: Diagnosis not present

## 2016-01-20 DIAGNOSIS — Z8551 Personal history of malignant neoplasm of bladder: Secondary | ICD-10-CM | POA: Insufficient documentation

## 2016-01-20 DIAGNOSIS — N401 Enlarged prostate with lower urinary tract symptoms: Secondary | ICD-10-CM | POA: Diagnosis not present

## 2016-01-20 DIAGNOSIS — D304 Benign neoplasm of urethra: Secondary | ICD-10-CM | POA: Diagnosis not present

## 2016-01-20 DIAGNOSIS — G4730 Sleep apnea, unspecified: Secondary | ICD-10-CM | POA: Insufficient documentation

## 2016-01-20 DIAGNOSIS — Z89612 Acquired absence of left leg above knee: Secondary | ICD-10-CM | POA: Diagnosis not present

## 2016-01-20 DIAGNOSIS — Z79899 Other long term (current) drug therapy: Secondary | ICD-10-CM | POA: Diagnosis not present

## 2016-01-20 DIAGNOSIS — R3915 Urgency of urination: Secondary | ICD-10-CM | POA: Diagnosis not present

## 2016-01-20 DIAGNOSIS — I739 Peripheral vascular disease, unspecified: Secondary | ICD-10-CM | POA: Insufficient documentation

## 2016-01-20 DIAGNOSIS — M1990 Unspecified osteoarthritis, unspecified site: Secondary | ICD-10-CM | POA: Insufficient documentation

## 2016-01-20 DIAGNOSIS — I2510 Atherosclerotic heart disease of native coronary artery without angina pectoris: Secondary | ICD-10-CM | POA: Insufficient documentation

## 2016-01-20 DIAGNOSIS — Z08 Encounter for follow-up examination after completed treatment for malignant neoplasm: Secondary | ICD-10-CM | POA: Diagnosis present

## 2016-01-20 DIAGNOSIS — E7800 Pure hypercholesterolemia, unspecified: Secondary | ICD-10-CM | POA: Diagnosis not present

## 2016-01-20 DIAGNOSIS — I129 Hypertensive chronic kidney disease with stage 1 through stage 4 chronic kidney disease, or unspecified chronic kidney disease: Secondary | ICD-10-CM | POA: Diagnosis not present

## 2016-01-20 DIAGNOSIS — Z86711 Personal history of pulmonary embolism: Secondary | ICD-10-CM | POA: Diagnosis not present

## 2016-01-20 DIAGNOSIS — C680 Malignant neoplasm of urethra: Secondary | ICD-10-CM | POA: Diagnosis not present

## 2016-01-20 DIAGNOSIS — I5022 Chronic systolic (congestive) heart failure: Secondary | ICD-10-CM | POA: Insufficient documentation

## 2016-01-20 DIAGNOSIS — Z86718 Personal history of other venous thrombosis and embolism: Secondary | ICD-10-CM | POA: Diagnosis not present

## 2016-01-20 DIAGNOSIS — I252 Old myocardial infarction: Secondary | ICD-10-CM | POA: Insufficient documentation

## 2016-01-20 DIAGNOSIS — I509 Heart failure, unspecified: Secondary | ICD-10-CM | POA: Diagnosis not present

## 2016-01-20 HISTORY — PX: CYSTOSCOPY WITH FULGERATION: SHX6638

## 2016-01-20 LAB — SURGICAL PCR SCREEN
MRSA, PCR: NEGATIVE
Staphylococcus aureus: NEGATIVE

## 2016-01-20 LAB — CBC
HCT: 38 % — ABNORMAL LOW (ref 39.0–52.0)
Hemoglobin: 12.7 g/dL — ABNORMAL LOW (ref 13.0–17.0)
MCH: 26.8 pg (ref 26.0–34.0)
MCHC: 33.4 g/dL (ref 30.0–36.0)
MCV: 80.3 fL (ref 78.0–100.0)
Platelets: 162 10*3/uL (ref 150–400)
RBC: 4.73 MIL/uL (ref 4.22–5.81)
RDW: 16.2 % — ABNORMAL HIGH (ref 11.5–15.5)
WBC: 3.4 10*3/uL — ABNORMAL LOW (ref 4.0–10.5)

## 2016-01-20 LAB — COMPREHENSIVE METABOLIC PANEL
ALT: 12 U/L — ABNORMAL LOW (ref 17–63)
AST: 22 U/L (ref 15–41)
Albumin: 3.9 g/dL (ref 3.5–5.0)
Alkaline Phosphatase: 154 U/L — ABNORMAL HIGH (ref 38–126)
Anion gap: 7 (ref 5–15)
BUN: 34 mg/dL — ABNORMAL HIGH (ref 6–20)
CO2: 29 mmol/L (ref 22–32)
Calcium: 9.7 mg/dL (ref 8.9–10.3)
Chloride: 102 mmol/L (ref 101–111)
Creatinine, Ser: 1.21 mg/dL (ref 0.61–1.24)
GFR calc Af Amer: 58 mL/min — ABNORMAL LOW (ref >60)
GFR calc non Af Amer: 50 mL/min — ABNORMAL LOW (ref >60)
Glucose, Bld: 89 mg/dL (ref 65–99)
Potassium: 4 mmol/L (ref 3.5–5.1)
Sodium: 138 mmol/L (ref 135–145)
Total Bilirubin: 0.6 mg/dL (ref 0.3–1.2)
Total Protein: 7.5 g/dL (ref 6.5–8.1)

## 2016-01-20 LAB — PROTIME-INR
INR: 1.28
Prothrombin Time: 16.1 seconds — ABNORMAL HIGH (ref 11.4–15.2)

## 2016-01-20 SURGERY — CYSTOSCOPY, WITH BLADDER FULGURATION
Anesthesia: General

## 2016-01-20 MED ORDER — ONDANSETRON HCL 4 MG/2ML IJ SOLN
INTRAMUSCULAR | Status: DC | PRN
  Administered 2016-01-20: 4 mg via INTRAVENOUS

## 2016-01-20 MED ORDER — OXYCODONE HCL 5 MG PO TABS: 5.0000 mg | ORAL_TABLET | ORAL | Status: DC | PRN

## 2016-01-20 MED ORDER — FENTANYL CITRATE (PF) 100 MCG/2ML IJ SOLN: 25.0000 ug | INTRAMUSCULAR | Status: DC | PRN

## 2016-01-20 MED ORDER — DEXAMETHASONE SODIUM PHOSPHATE 10 MG/ML IJ SOLN
INTRAMUSCULAR | Status: AC
  Filled 2016-01-20: qty 1

## 2016-01-20 MED ORDER — LIDOCAINE 2% (20 MG/ML) 5 ML SYRINGE
INTRAMUSCULAR | Status: DC | PRN
  Administered 2016-01-20: 40 mg via INTRAVENOUS

## 2016-01-20 MED ORDER — GLYCOPYRROLATE 0.2 MG/ML IV SOSY
PREFILLED_SYRINGE | INTRAVENOUS | Status: AC
  Filled 2016-01-20: qty 3

## 2016-01-20 MED ORDER — ONDANSETRON HCL 4 MG/2ML IJ SOLN
INTRAMUSCULAR | Status: AC
  Filled 2016-01-20: qty 2

## 2016-01-20 MED ORDER — GLYCOPYRROLATE 0.2 MG/ML IV SOSY
PREFILLED_SYRINGE | INTRAVENOUS | Status: DC | PRN
  Administered 2016-01-20: .2 mg via INTRAVENOUS
  Administered 2016-01-20: 0.4 mg via INTRAVENOUS

## 2016-01-20 MED ORDER — SODIUM CHLORIDE 0.9 % IV SOLN: 250.0000 mL | INTRAVENOUS | Status: DC | PRN

## 2016-01-20 MED ORDER — ACETAMINOPHEN 325 MG PO TABS: 650.0000 mg | ORAL_TABLET | ORAL | Status: DC | PRN

## 2016-01-20 MED ORDER — CEFAZOLIN SODIUM-DEXTROSE 2-4 GM/100ML-% IV SOLN
INTRAVENOUS | Status: AC
  Filled 2016-01-20: qty 100

## 2016-01-20 MED ORDER — SODIUM CHLORIDE 0.9% FLUSH: 3.0000 mL | Freq: Two times a day (BID) | INTRAVENOUS | Status: DC

## 2016-01-20 MED ORDER — MORPHINE SULFATE (PF) 10 MG/ML IV SOLN: 1.0000 mg | INTRAVENOUS | Status: DC | PRN

## 2016-01-20 MED ORDER — DEXTROSE 5 % IV SOLN
5.0000 mg/kg | Freq: Once | INTRAVENOUS | Status: AC
Start: 2016-01-20 — End: 2016-01-20
  Administered 2016-01-20: 340 mg via INTRAVENOUS
  Filled 2016-01-20: qty 8.5

## 2016-01-20 MED ORDER — 0.9 % SODIUM CHLORIDE (POUR BTL) OPTIME
TOPICAL | Status: DC | PRN
  Administered 2016-01-20: 1000 mL

## 2016-01-20 MED ORDER — PROPOFOL 10 MG/ML IV BOLUS
INTRAVENOUS | Status: DC | PRN
  Administered 2016-01-20: 20 mg via INTRAVENOUS
  Administered 2016-01-20: 100 mg via INTRAVENOUS

## 2016-01-20 MED ORDER — CEFAZOLIN SODIUM-DEXTROSE 2-4 GM/100ML-% IV SOLN
2.0000 g | INTRAVENOUS | Status: AC
  Administered 2016-01-20: 2 g via INTRAVENOUS
  Filled 2016-01-20: qty 100

## 2016-01-20 MED ORDER — STERILE WATER FOR IRRIGATION IR SOLN
Status: DC | PRN
  Administered 2016-01-20: 3000 mL

## 2016-01-20 MED ORDER — LIDOCAINE 2% (20 MG/ML) 5 ML SYRINGE
INTRAMUSCULAR | Status: AC
  Filled 2016-01-20: qty 5

## 2016-01-20 MED ORDER — FENTANYL CITRATE (PF) 100 MCG/2ML IJ SOLN
INTRAMUSCULAR | Status: AC
  Filled 2016-01-20: qty 2

## 2016-01-20 MED ORDER — SODIUM CHLORIDE 0.9% FLUSH: 3.0000 mL | INTRAVENOUS | Status: DC | PRN

## 2016-01-20 MED ORDER — CEPHALEXIN 250 MG PO CAPS: 250.0000 mg | ORAL_CAPSULE | Freq: Four times a day (QID) | ORAL | 0 refills | Status: DC

## 2016-01-20 MED ORDER — ACETAMINOPHEN 650 MG RE SUPP
650.0000 mg | RECTAL | Status: DC | PRN
  Filled 2016-01-20: qty 1

## 2016-01-20 MED ORDER — LACTATED RINGERS IV SOLN
INTRAVENOUS | Status: DC
  Administered 2016-01-20: 10:00:00 via INTRAVENOUS

## 2016-01-20 SURGICAL SUPPLY — 9 items
BAG URO CATCHER STRL LF (MISCELLANEOUS) ×2 IMPLANT
CATH URET 5FR 28IN OPEN ENDED (CATHETERS) IMPLANT
CLOTH BEACON ORANGE TIMEOUT ST (SAFETY) ×2 IMPLANT
GLOVE SURG SS PI 8.0 STRL IVOR (GLOVE) IMPLANT
GOWN STRL REUS W/TWL XL LVL3 (GOWN DISPOSABLE) ×2 IMPLANT
GUIDEWIRE STR DUAL SENSOR (WIRE) ×2 IMPLANT
MANIFOLD NEPTUNE II (INSTRUMENTS) ×2 IMPLANT
PACK CYSTO (CUSTOM PROCEDURE TRAY) ×2 IMPLANT
TUBING CONNECTING 10 (TUBING) ×2 IMPLANT

## 2016-01-20 NOTE — Anesthesia Postprocedure Evaluation (Signed)
Anesthesia Post Note  Patient: Scott [name]  Procedure(s) Performed: Procedure(s) (LRB): CYSTOSCOPY, BIOPSY WITH FULGERATION OF URTHRAL TUMOR (N/A)  Patient location during evaluation: PACU Anesthesia Type: General Level of consciousness: awake and alert Pain management: pain level controlled Vital Signs Assessment: post-procedure vital signs reviewed and stable Respiratory status: spontaneous breathing, nonlabored ventilation, respiratory function stable and patient connected to nasal cannula oxygen Cardiovascular status: blood pressure returned to baseline and stable Postop Assessment: no signs of nausea or vomiting Anesthetic complications: no    Last Vitals:  Vitals:   01/20/16 1200 01/20/16 1215  BP: (!) 171/76 (!) 152/78  Pulse: 81 74  Resp: 19 15  Temp: 36.6 C     Last Pain:  Vitals:   01/20/16 0845  TempSrc: Oral                 Tyjai Charbonnet J

## 2016-01-20 NOTE — Discharge Instructions (Addendum)
Foley Catheter Care, Adult A Foley catheter is a soft, flexible tube. This tube is placed into your bladder to drain pee (urine). If you go home with this catheter in place, follow the instructions below. TAKING CARE OF THE CATHETER 1. Wash your hands with soap and water. 2. Put soap and water on a clean washcloth.  Clean the skin where the tube goes into your body.  Clean away from the tube site.  Never wipe toward the tube.  Clean the area using a circular motion.  Remove all the soap. Pat the area dry with a clean towel. For males, reposition the skin that covers the end of the penis (foreskin). 3. Attach the tube to your leg with tape or a leg strap. Do not stretch the tube tight. If you are using tape, remove any stickiness left behind by past tape you used. 4. Keep the drainage bag below your hips. Keep it off the floor. 5. Check your tube during the day. Make sure it is working and draining. Make sure the tube does not curl, twist, or bend. 6. Do not pull on the tube or try to take it out. TAKING CARE OF THE DRAINAGE BAGS You will have a large overnight drainage bag and a small leg bag. You may wear the overnight bag any time. Never wear the small bag at night. Follow the directions below. Emptying the Drainage Bag Empty your drainage bag when it is  - full or at least 2-3 times a day. 1. Wash your hands with soap and water. 2. Keep the drainage bag below your hips. 3. Hold the dirty bag over the toilet or clean container. 4. Open the pour spout at the bottom of the bag. Empty the pee into the toilet or container. Do not let the pour spout touch anything. 5. Clean the pour spout with a gauze pad or cotton ball that has rubbing alcohol on it. 6. Close the pour spout. 7. Attach the bag to your leg with tape or a leg strap. 8. Wash your hands well. Changing the Drainage Bag Change your bag once a month or sooner if it starts to smell or look dirty.  1. Wash your hands with soap  and water. 2. Pinch the rubber tube so that pee does not spill out. 3. Disconnect the catheter tube from the drainage tube at the connection valve. Do not let the tubes touch anything. 4. Clean the end of the catheter tube with an alcohol wipe. Clean the end of a the drainage tube with a different alcohol wipe. 5. Connect the catheter tube to the drainage tube of the clean drainage bag. 6. Attach the new bag to the leg with tape or a leg strap. Avoid attaching the new bag too tightly. 7. Wash your hands well. Cleaning the Drainage Bag 1. Wash your hands with soap and water. 2. Wash the bag in warm, soapy water. 3. Rinse the bag with warm water. 4. Fill the bag with a mixture of white vinegar and water (1 cup vinegar to 1 quart warm water [.2 liter vinegar to 1 liter warm water]). Close the bag and soak it for 30 minutes in the solution. 5. Rinse the bag with warm water. 6. Hang the bag to dry with the pour spout open and hanging downward. 7. Store the clean bag (once it is dry) in a clean plastic bag. 8. Wash your hands well. PREVENT INFECTION  Wash your hands before and after touching your tube.  Take showers every day. Wash the skin where the tube enters your body. Do not take baths. Replace wet leg straps with dry ones, if this applies.  Do not use powders, sprays, or lotions on the genital area. Only use creams, lotions, or ointments as told by your doctor.  For females, wipe from front to back after going to the bathroom.  Drink enough fluids to keep your pee clear or pale yellow unless you are told not to have too much fluid (fluid restriction).  Do not let the drainage bag or tubing touch or lie on the floor.  Wear cotton underwear to keep the area dry. GET HELP IF:  Your pee is cloudy or smells unusually bad.  Your tube becomes clogged.  You are not draining pee into the bag or your bladder feels full.  Your tube starts to leak. GET HELP RIGHT AWAY IF:  You have  pain, puffiness (swelling), redness, or yellowish-white fluid (pus) where the tube enters the body.  You have pain in the belly (abdomen), legs, lower back, or bladder.  You have a fever.  You see blood fill the tube, or your pee is pink or red.  You feel sick to your stomach (nauseous), throw up (vomit), or have chills.  Your tube gets pulled out. MAKE SURE YOU:   Understand these instructions.  Will watch your condition.  Will get help right away if you are not doing well or get worse.   This information is not intended to replace advice given to you by your health care provider. Make sure you discuss any questions you have with your health care provider.   Document Released: 07/24/2012 Document Revised: 04/19/2014 Document Reviewed: 07/24/2012 Elsevier Interactive Patient Education 2016 [place] Anesthesia, Adult, Care After Refer to this sheet in the next few weeks. These instructions provide you with information on caring for yourself after your procedure. Your health care provider may also give you more specific instructions. Your treatment has been planned according to current medical practices, but problems sometimes occur. Call your health care provider if you have any problems or questions after your procedure. WHAT TO EXPECT AFTER THE PROCEDURE After the procedure, it is typical to experience:  Sleepiness.  Nausea and vomiting. HOME CARE INSTRUCTIONS  For the first 24 hours after general anesthesia:  Have a responsible person with you.  Do not drive a car. If you are alone, do not take public transportation.  Do not drink alcohol.  Do not take medicine that has not been prescribed by your health care provider.  Do not sign important papers or make important decisions.  You may resume a normal diet and activities as directed by your health care provider.  Change bandages (dressings) as directed.  If you have questions or problems that seem  related to general anesthesia, call the hospital and ask for the anesthetist or anesthesiologist on call. SEEK MEDICAL CARE IF:  You have nausea and vomiting that continue the day after anesthesia.  You develop a rash. SEEK IMMEDIATE MEDICAL CARE IF:   You have difficulty breathing.  You have chest pain.  You have any allergic problems.   This information is not intended to replace advice given to you by your health care provider. Make sure you discuss any questions you have with your health care provider.   Document Released: 07/05/2000 Document Revised: 04/19/2014 Document Reviewed: 07/28/2011 Elsevier Interactive Patient Education 2016 [place]  Activity: Rest for the remainder of the day.  Do not drive or operate equipment today.  You may resume normal activities in one to two days as instructed by your physician.   Meals: Drink plenty of liquids and eat light foods such as gelatin or soup this evening.  You may return to a normal meal plan tomorrow.  Return to Work: You may return to work in one to two days or as instructed by your physician.  Special Instructions / Symptoms: Call your physician if any of these symptoms occur:   -persistent or heavy bleeding  -bleeding which continues after first few urination  -large blood clots that are difficult to pass  -urine stream diminishes or stops completely  -fever equal to or higher than 101 degrees Farenheit.  -cloudy urine with a strong, foul odor  -severe pain  Females should always wipe from front to back after elimination.  You may feel some burning pain when you urinate.  This should disappear with time.  Applying moist heat to the lower abdomen or a hot tub bath may help relieve the pain. \  The catheter can be removed in the morning.  He can resume the warfarin in 24 hours.  Patient Signature:  ________________________________________________________  Nurse's  Signature:  ________________________________________________________

## 2016-01-20 NOTE — Brief Op Note (Signed)
01/20/2016  11:10 AM  PATIENT:  Scott [name]  82 y.o. 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  BLOOD ADMINISTERED:none  DRAINS: Urinary Catheter (Foley)   LOCAL MEDICATIONS USED:  NONE  SPECIMEN:  Source of Specimen:  urethral biopsy  DISPOSITION OF SPECIMEN:  PATHOLOGY  COUNTS:  YES  TOURNIQUET:  * No tourniquets in log *  DICTATION: .Other Dictation: Dictation Number [id number]  PLAN OF CARE: Discharge to home after PACU  PATIENT DISPOSITION:  PACU - hemodynamically stable.   Delay start of Pharmacological VTE agent (>24hrs) due to surgical blood loss or risk of bleeding: yes

## 2016-01-20 NOTE — Anesthesia Procedure Notes (Signed)
Procedure Name: LMA Insertion Date/Time: 01/20/2016 10:53 AM Performed by: Talbot Grumbling Pre-anesthesia Checklist: Emergency Drugs available, Suction available, Patient being monitored and Patient identified Patient Re-evaluated:Patient Re-evaluated prior to inductionOxygen Delivery Method: Circle system utilized Preoxygenation: Pre-oxygenation with 100% oxygen Intubation Type: IV induction Ventilation: Mask ventilation without difficulty LMA: LMA with gastric port inserted LMA Size: 5.0 Number of attempts: 2 Placement Confirmation: positive ETCO2 and breath sounds checked- equal and bilateral Tube secured with: Tape Dental Injury: Teeth and Oropharynx as per pre-operative assessment

## 2016-01-20 NOTE — Transfer of Care (Signed)
Immediate Anesthesia Transfer of Care Note  Patient: Scott [name]  Procedure(s) Performed: Procedure(s): CYSTOSCOPY, BIOPSY WITH FULGERATION OF URTHRAL TUMOR (N/A)  Patient Location: PACU  Anesthesia Type:General  Level of Consciousness:  sedated, patient cooperative and responds to stimulation  Airway & Oxygen Therapy:Patient Spontanous Breathing and Patient connected to face mask oxgen  Post-op Assessment:  Report given to PACU RN and Post -op Vital signs reviewed and stable  Post vital signs:  Reviewed and stable  Last Vitals:  Vitals:   01/20/16 0845  BP: (!) 166/69  Pulse: (!) 51  Resp: 18  Temp: [id number] C    Complications: No apparent anesthesia complications

## 2016-01-20 NOTE — Anesthesia Preprocedure Evaluation (Signed)
Anesthesia Evaluation  Patient identified by MRN, date of birth, ID band Patient awake    Reviewed: Allergy & Precautions, NPO status , Patient's Chart, lab work & pertinent test results  Airway Mallampati: II  TM Distance: >3 FB Neck ROM: Full    Dental no notable dental hx.    Pulmonary sleep apnea , pneumonia, former smoker,    Pulmonary exam normal breath sounds clear to auscultation       Cardiovascular hypertension, + CAD, + Past MI, + Peripheral Vascular Disease and +CHF  Normal cardiovascular exam+ Valvular Problems/Murmurs  Rhythm:Regular Rate:Normal  ECHO 10-04-14:  Study Conclusions  - Left ventricle: Abnormal septal motion The cavity size was   normal. There was mild concentric hypertrophy. Systolic function   was normal. The estimated ejection fraction was in the range of   50% to 55%. Doppler parameters are consistent with abnormal left   ventricular relaxation (grade 1 diastolic dysfunction). - Aortic valve: There was trivial regurgitation. - Mitral valve: Calcified annulus.   Neuro/Psych PSYCHIATRIC DISORDERS Depression negative neurological ROS     GI/Hepatic negative GI ROS, Neg liver ROS,   Endo/Other  negative endocrine ROS  Renal/GU Renal disease  negative genitourinary   Musculoskeletal  (+) Arthritis ,   Abdominal   Peds negative pediatric ROS (+)  Hematology negative hematology ROS (+)   Anesthesia Other Findings   Reproductive/Obstetrics negative OB ROS                             Anesthesia Physical Anesthesia Plan  ASA: III  Anesthesia Plan: General   Post-op Pain Management:    Induction: Intravenous  Airway Management Planned: LMA  Additional Equipment:   Intra-op Plan:   Post-operative Plan: Extubation in OR  Informed Consent: I have reviewed the patients History and Physical, chart, labs and discussed the procedure including the risks,  benefits and alternatives for the proposed anesthesia with the patient or authorized representative who has indicated his/her understanding and acceptance.   Dental advisory given  Plan Discussed with: CRNA  Anesthesia Plan Comments:         Anesthesia Quick Evaluation

## 2016-01-20 NOTE — Interval H&P Note (Signed)
History and Physical Interval Note:  His last procedure attempt was aborted because of bradycardia on induction.   He has been cleared by cardiology.   He has urgency but no further hematuria.   He is without CP or SOB today.  Lungs: CTA,  CV: RRR  01/20/2016 10:39 AM  Scott [name]  has presented today for surgery, with the diagnosis of urethral tumor  The various methods of treatment have been discussed with the patient and family. After consideration of risks, benefits and other options for treatment, the patient has consented to  Procedure(s): CYSTOSCOPY, BIOPSY WITH FULGERATION OF URTHRAL TUMOR (N/A) as a surgical intervention .  The patient's history has been reviewed, patient examined, no change in status, stable for surgery.  I have reviewed the patient's chart and labs.  Questions were answered to the patient's satisfaction.     Laurabelle Gorczyca J

## 2016-01-21 ENCOUNTER — Ambulatory Visit: Payer: Medicare Other | Admitting: Family Medicine

## 2016-01-21 ENCOUNTER — Encounter: Payer: Self-pay | Admitting: Family Medicine

## 2016-01-21 VITALS — BP 122/62 | HR 55 | Temp 98.5°F

## 2016-01-21 DIAGNOSIS — I4891 Unspecified atrial fibrillation: Secondary | ICD-10-CM

## 2016-01-21 DIAGNOSIS — Z86718 Personal history of other venous thrombosis and embolism: Secondary | ICD-10-CM | POA: Diagnosis not present

## 2016-01-21 DIAGNOSIS — Z01810 Encounter for preprocedural cardiovascular examination: Secondary | ICD-10-CM

## 2016-01-21 DIAGNOSIS — K219 Gastro-esophageal reflux disease without esophagitis: Secondary | ICD-10-CM

## 2016-01-21 DIAGNOSIS — I10 Essential (primary) hypertension: Secondary | ICD-10-CM | POA: Diagnosis not present

## 2016-01-21 DIAGNOSIS — I739 Peripheral vascular disease, unspecified: Secondary | ICD-10-CM | POA: Diagnosis not present

## 2016-01-21 DIAGNOSIS — Z89612 Acquired absence of left leg above knee: Secondary | ICD-10-CM

## 2016-01-21 DIAGNOSIS — E7800 Pure hypercholesterolemia, unspecified: Secondary | ICD-10-CM

## 2016-01-21 DIAGNOSIS — C679 Malignant neoplasm of bladder, unspecified: Secondary | ICD-10-CM

## 2016-01-21 NOTE — Op Note (Signed)
Scott [name], Scott [name] NO.:  [id number]  MEDICAL RECORD NO.:  [id number]  LOCATION:  [facility]                         FACILITY:  [facility]  PHYSICIAN:  Marshall Cork. Jeffie Pollock, M.D.    DATE OF BIRTH:  15-Jan-1922  DATE OF PROCEDURE:  01/20/2016 DATE OF DISCHARGE:  01/20/2016                              OPERATIVE REPORT   PROCEDURE:  Cystoscopy with biopsy and fulguration of 1.5-cm urethral tumor.  PREOPERATIVE DIAGNOSIS:  Urethral tumor.  POSTOPERATIVE DIAGNOSES:  Urethral tumor with possible urinary tract infection.  SURGEON:  Marshall Cork. Jeffie Pollock, M.D.  ANESTHESIA:  General.  SPECIMEN:  Urethral biopsy.  DRAIN:  18-French Foley catheter.  BLOOD LOSS:  None.  COMPLICATIONS:  None.  INDICATIONS:  [name] is a 82 year old African American male with a history of bladder tumors, who was recently cystoscoped for recurrent hematuria and was found to have urethral tumor.  In August, he was originally scheduled in [place] for biopsy and fulguration of the lesion, but during induction, he became bradycardic and the procedure was aborted.  He returns now for another attempt after receiving cardiac clearance.  He has had no further bleeding, but has some urgency today.  FINDINGS AND PROCEDURES:  He was taken to the operating room where he was given 2 g of Ancef.  A general anesthetic was induced.  He was placed in lithotomy position.  His perineum and genitalia were prepped with Betadine solution, he was draped in usual sterile fashion.  Cystoscopy was performed using a 23-French scope with 30-degree lens. Examination revealed the normal urethra with the exception of a patch of papillary fronds in the posterior bulb approximately 1 x 1.5 cm in size. The prostatic urethra had prior resection with a patent channel. Examination of the bladder revealed turbid urine and the bladder was drained and rinsed.  There was erythema in the mucosa, which was concerning for cystitis.  No  tumors were noted.  The ureteral orifices were unremarkable.  At this point, the scope was backed out to the midurethra and a cup biopsy forceps was used to biopsy of the urethral lesion, which was then fulgurated in its entirety with the Bugbee electrode.  The scope was removed and an 18-French Foley catheter was inserted, and the balloon was filled with 10 mL sterile fluid and the catheter was placed to straight drainage.  Additionally, the patient will receive gentamicin per pharmacy dosing because of concern for possible infection.  At this point, the patient was taken down from lithotomy position.  His anesthetic was reversed.  He was moved to the recovery room in stable condition.  There were no complications.  I will keep him on antibiotic postoperatively.     Marshall Cork. Jeffie Pollock, M.D.     JJW/MEDQ  D:  01/20/2016  T:  01/21/2016  Job:  [id number]

## 2016-01-21 NOTE — Progress Notes (Addendum)
Subjective:    Patient ID: Scott [name], male    DOB: 07-Aug-1921, 82 y.o.   MRN: [id number]  HPI Pt seen today in home for follow up and management of chronic medical problems which includes hypertension and hyperlipidemia. He is taking medications regularly. He is accompanied today by his wife and caregiver, [name].Despite this patient's age and his history of multiple medical conditions he is alert and cooperative and pleasant. 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 Just yesterday he had a cystoscopy and fulguration of a tumor that was located in the urethra by his urologist. He has a follow-up visit scheduled on October 20. She has a history of hypertension and coronary artery disease along with vitamin D deficiency. He did have in the past a non-ST segment elevated myocardial infarction. He also has a history of atrial fibrillation. Today the patient was alert and denied any symptoms. He denied any chest pain or shortness of breath. He says today he is not having any problems passing his water especially since the catheter was removed from the surgery that was done yesterday. He is calm and alert. This patient has already been given his flu shot.     Patient Active Problem List   Diagnosis Date Noted  . Essential hypertension 11/29/2015  . Bradycardia 11/28/2015  . Malignant neoplasm of lateral wall of urinary bladder ([place]) 07/10/2015  . Status post above knee amputation of left lower extremity ([place]) 10/18/2014  . Tachyarrhythmia 10/05/2014  . Aspiration pneumonia ([place]) 10/05/2014  . UTI (lower urinary tract infection) 10/03/2014  . Sepsis due to other etiology ([place]) 10/03/2014  . Gangrene of toe ([place]) 10/03/2014  . Ischemic ulcer of left foot ([place]) 10/03/2014  . Atherosclerotic PVD with ulceration ([place]) 10/03/2014  . Warfarin-induced  coagulopathy ([place]) 10/03/2014  . Atrial fibrillation with RVR ([place]) 10/03/2014  . Acute kidney injury ([place]) 10/03/2014  . Hyperkalemia 10/03/2014  . Cellulitis of left leg 10/03/2014  . PAD (peripheral artery disease) ([place]) 09/24/2014  . NSTEMI (non-ST elevated myocardial infarction) ([place]) 10/09/2013  . High risk medication use 05/17/2013  . BPH (benign prostatic hyperplasia) 05/17/2013  . Vitamin D deficiency 05/17/2013  . History of pulmonary embolism (2014) 04/17/2013  . NSVT (nonsustained ventricular tachycardia)- not an ICD candidate 04/17/2013  . NICM (nonischemic cardiomyopathy)- EF 20-25% by Echo 04/12/13 04/17/2013  . Chronic diastolic heart failure, NYHA class 1 ([place]) 03/15/2013  . DVT of lower extremity, bilateral ([place]) 03/15/2013  . Fatigue 11/02/2012  . Constipation 11/02/2012  . Alcohol abuse 08/27/2012  . Syncope 06/21/2012  . Bladder cancer ([place]) 03/15/2012  . Benign localized hyperplasia of prostate with urinary obstruction 03/15/2012  . Benign essential HTN   . Hyperlipidemia   . PVCs (premature ventricular contractions)   . CAD- non obstructive disease by cath 3/14    Outpatient Encounter Prescriptions as of 01/21/2016  Medication Sig  . cephALEXin (KEFLEX) 250 MG capsule Take 1 capsule (250 mg total) by mouth 4 (four) times daily.  Marland Kitchen donepezil (ARICEPT) 10 MG tablet TAKE ONE TABLET AT BEDTIME  . ferrous sulfate 325 (65 FE) MG tablet Take 1 tablet (325 mg total) by mouth daily with breakfast.  . folic acid (FOLVITE) 1 MG tablet Take 1 tablet (1 mg total) by mouth daily.  . furosemide (LASIX) 40 MG tablet TAKE 1 TABLET EVERY DAY  .  gabapentin (NEURONTIN) 100 MG capsule TAKE (2) CAPSULES TWICE DAILY.  . isosorbide dinitrate (ISORDIL) 30 MG tablet TAKE 1 TABLET ONCE A DAY  . metoprolol succinate (TOPROL-XL) 50 MG 24 hr tablet TAKE A HALF A TABLET (25 MG) DAILY IF YOUR HEART RATE IS ABOVE 60. (Take with or immediately following a meal.) (Patient taking differently:  Take 25 mg by mouth as directed. TAKE A HALF A TABLET (25 MG) DAILY IF YOUR HEART RATE IS ABOVE 60. IF BELOW 60 DO NOT TAKE ANY TOPROL (Take with or immediately following a meal.))  . nitroGLYCERIN (NITROSTAT) 0.4 MG SL tablet Place 1 tablet (0.4 mg total) under the tongue every 5 (five) minutes as needed for chest pain (up to 3 doses).  . pantoprazole (PROTONIX) 40 MG tablet Take 1 tablet (40 mg total) by mouth daily.  . sertraline (ZOLOFT) 50 MG tablet TAKE 1 TABLET IN THE MORNING  . simvastatin (ZOCOR) 10 MG tablet Take 1 tablet (10 mg total) by mouth daily.  . tamsulosin (FLOMAX) 0.4 MG CAPS capsule TAKE (1) CAPSULE DAILY  . ULORIC 40 MG tablet TAKE 1 TABLET DAILY  . warfarin (COUMADIN) 2 MG tablet TAKE 1 TABLET ALONG WITH 2.5MG  TO EQUAL 4.5 MG DAILY  . warfarin (COUMADIN) 2.5 MG tablet TAKES WITH 2MG  TABLET TO EQUAL 4.5MG  DAILY   No facility-administered encounter medications on file as of 01/21/2016.       Review of Systems  Constitutional: Negative.   HENT: Negative.   Eyes: Negative.   Respiratory: Negative.   Cardiovascular: Positive for leg swelling (slight right ankle edema).  Gastrointestinal: Negative.   Endocrine: Negative.   Genitourinary: Negative.   Musculoskeletal: Negative.   Skin: Negative.   Allergic/Immunologic: Negative.   Neurological: Negative.   Hematological: Negative.   Psychiatric/Behavioral: Negative.        Objective:   Physical Exam  Constitutional: He is oriented to person, place, and time. He appears well-developed and well-nourished. No distress.  Pleasant and alert elderly black male in no acute distress  HENT:  Head: Normocephalic and atraumatic.  Nose: Nose normal.  Mouth/Throat: Oropharynx is clear and moist. No oropharyngeal exudate.  Eyes: Conjunctivae and EOM are normal. Pupils are equal, round, and reactive to light. Right eye exhibits no discharge. Left eye exhibits no discharge. No scleral icterus.  Neck: Normal range of motion.  Neck supple. No thyromegaly present.  Cardiovascular: Normal rate, regular rhythm and normal heart sounds.   No murmur heard. Pulmonary/Chest: Effort normal and breath sounds normal. No respiratory distress. He has no wheezes. He has no rales.  Abdominal: Soft. Bowel sounds are normal. He exhibits no mass. There is no tenderness. There is no rebound and no guarding.  Musculoskeletal: He exhibits deformity. He exhibits no edema.  The patient was in his wheelchair today. He did not have his prosthesis on. The stump was clear of infection redness or drainage. However when on, the prosthesis is not fitting properly and the patient feels uncomfortable with maintaining his gait without falling.  Lymphadenopathy:    He has no cervical adenopathy.  Neurological: He is alert and oriented to person, place, and time. He has normal reflexes.  Skin: Skin is warm and dry. No rash noted.  Psychiatric: He has a normal mood and affect. His behavior is normal. Judgment and thought content normal.  Nursing note and vitals reviewed.  BP 122/62 (BP Location: Left Arm)   Pulse (!) 55   Temp 98.5 F (36.9 C) (Oral)  Assessment & Plan:  1. History of DVT (deep vein thrombosis) -Patient remains on Coumadin.  2. Pure hypercholesterolemia -Lab work to be drawn  3. Encounter for pre-operative cardiovascular clearance  4. Gastroesophageal reflux disease, esophagitis presence not specified -The patient had no complaints today with his stomach. He should continue with his pantoprazole.   5. Essential hypertension -The blood pressure was good today he should continue with current treatment  6. Atrial fibrillation, unspecified type ([place]) -The heart had a regular rate and rhythm and he should continue with his Coumadin as directed and until the next ProTime is checked -Follow-up with cardiology as planned  7. Malignant neoplasm of urinary bladder, unspecified site [facility]) -Follow-up with urology as  planned  8. Peripheral vascular insufficiency (HCC) -Continue with aggressive therapeutic lifestyle changes  9. S/P AKA (above knee amputation) unilateral, left (HCC) -The stump appears to be well healed without drainage or infection -The patient is in need of a new prosthesis to achieve a better fit to give him more confidence with using it and walking. Patient Instructions                       Medicare Annual Wellness Visit  [facility] and the medical providers at [facility] strive to bring you the best medical care.  In doing so we not only want to address your current medical conditions and concerns but also to detect new conditions early and prevent illness, disease and health-related problems.    Medicare offers a yearly Wellness Visit which allows our clinical staff to assess your need for preventative services including immunizations, lifestyle education, counseling to decrease risk of preventable diseases and screening for fall risk and other medical concerns.    This visit is provided free of charge (no copay) for all Medicare recipients. The clinical pharmacists at [facility] have begun to conduct these Wellness Visits which will also include a thorough review of all your medications.    As you primary medical provider recommend that you make an appointment for your Annual Wellness Visit if you have not done so already this year.  You may set up this appointment before you leave today or you may call back [phone] and schedule an appointment.  Please make sure when you call that you mention that you are scheduling your Annual Wellness Visit with the clinical pharmacist so that the appointment may be made for the proper length of time.     Continue current medications. Continue good therapeutic lifestyle changes which include good diet and exercise. Fall precautions discussed with patient. If an FOBT was given today- please return  it to our front desk. If you are over 81 years old - you may need Prevnar 46 or the adult Pneumonia vaccine.  **Flu shots are available--- please call and schedule a FLU-CLINIC appointment**  After your visit with [place] today you will receive a survey in the mail or online from [employer] regarding your care with [place]. Please take a moment to fill this out. Your feedback is very important to [place] as you can help [place] better understand your patient needs as well as improve your experience and satisfaction. WE CARE ABOUT YOU!!!   The patient should keep his follow-up visit with the urologist In the home health nurse comes to draw blood on his wife we should also get a CBC BMP LFTs and lipid panel on this patient. He should also be getting regular pro  times.  Arrie Senate MD

## 2016-01-21 NOTE — Patient Instructions (Addendum)
Medicare Annual Wellness Visit  [facility] and the medical providers at [facility] strive to bring you the best medical care.  In doing so we not only want to address your current medical conditions and concerns but also to detect new conditions early and prevent illness, disease and health-related problems.    Medicare offers a yearly Wellness Visit which allows our clinical staff to assess your need for preventative services including immunizations, lifestyle education, counseling to decrease risk of preventable diseases and screening for fall risk and other medical concerns.    This visit is provided free of charge (no copay) for all Medicare recipients. The clinical pharmacists at [facility] have begun to conduct these Wellness Visits which will also include a thorough review of all your medications.    As you primary medical provider recommend that you make an appointment for your Annual Wellness Visit if you have not done so already this year.  You may set up this appointment before you leave today or you may call back [phone] and schedule an appointment.  Please make sure when you call that you mention that you are scheduling your Annual Wellness Visit with the clinical pharmacist so that the appointment may be made for the proper length of time.     Continue current medications. Continue good therapeutic lifestyle changes which include good diet and exercise. Fall precautions discussed with patient. If an FOBT was given today- please return it to our front desk. If you are over 62 years old - you may need Prevnar 52 or the adult Pneumonia vaccine.  **Flu shots are available--- please call and schedule a FLU-CLINIC appointment**  After your visit with [place] today you will receive a survey in the mail or online from [employer] regarding your care with [place]. Please take a moment to fill this out. Your feedback is very  important to [place] as you can help [place] better understand your patient needs as well as improve your experience and satisfaction. WE CARE ABOUT YOU!!!   The patient should keep his follow-up visit with the urologist In the home health nurse comes to draw blood on his wife we should also get a CBC BMP LFTs and lipid panel on this patient. He should also be getting regular pro times.

## 2016-01-22 NOTE — Addendum Note (Signed)
Addended by: Ilean China on: 01/22/2016 11:39 AM   Modules accepted: Orders

## 2016-01-27 ENCOUNTER — Other Ambulatory Visit: Payer: Self-pay | Admitting: Family Medicine

## 2016-01-30 ENCOUNTER — Ambulatory Visit (INDEPENDENT_AMBULATORY_CARE_PROVIDER_SITE_OTHER): Payer: Medicare Other | Admitting: Urology

## 2016-01-30 DIAGNOSIS — C680 Malignant neoplasm of urethra: Secondary | ICD-10-CM | POA: Diagnosis not present

## 2016-02-03 ENCOUNTER — Other Ambulatory Visit: Payer: Self-pay | Admitting: Family

## 2016-02-03 ENCOUNTER — Other Ambulatory Visit: Payer: Self-pay | Admitting: Family Medicine

## 2016-02-12 ENCOUNTER — Other Ambulatory Visit: Payer: Self-pay | Admitting: Family Medicine

## 2016-02-18 ENCOUNTER — Other Ambulatory Visit: Payer: Self-pay | Admitting: Family Medicine

## 2016-02-18 ENCOUNTER — Ambulatory Visit (INDEPENDENT_AMBULATORY_CARE_PROVIDER_SITE_OTHER): Payer: Medicare Other | Admitting: Pharmacist

## 2016-02-18 DIAGNOSIS — Z86718 Personal history of other venous thrombosis and embolism: Secondary | ICD-10-CM

## 2016-02-18 DIAGNOSIS — I82423 Acute embolism and thrombosis of iliac vein, bilateral: Secondary | ICD-10-CM

## 2016-02-18 DIAGNOSIS — Z7901 Long term (current) use of anticoagulants: Secondary | ICD-10-CM | POA: Diagnosis not present

## 2016-02-18 LAB — COAGUCHEK XS/INR WAIVED
INR: 2.9 — ABNORMAL HIGH (ref 0.9–1.1)
Prothrombin Time: 35.4 s

## 2016-02-28 ENCOUNTER — Other Ambulatory Visit: Payer: Self-pay | Admitting: Family Medicine

## 2016-03-19 ENCOUNTER — Ambulatory Visit (INDEPENDENT_AMBULATORY_CARE_PROVIDER_SITE_OTHER): Payer: Medicare Other | Admitting: Pharmacist

## 2016-03-19 DIAGNOSIS — I82423 Acute embolism and thrombosis of iliac vein, bilateral: Secondary | ICD-10-CM

## 2016-03-19 DIAGNOSIS — Z86711 Personal history of pulmonary embolism: Secondary | ICD-10-CM | POA: Diagnosis not present

## 2016-03-19 LAB — COAGUCHEK XS/INR WAIVED
INR: 2.5 — ABNORMAL HIGH (ref 0.9–1.1)
Prothrombin Time: 30.3 s

## 2016-03-29 ENCOUNTER — Other Ambulatory Visit: Payer: Self-pay | Admitting: Family Medicine

## 2016-04-15 ENCOUNTER — Other Ambulatory Visit: Payer: Self-pay | Admitting: Nurse Practitioner

## 2016-04-15 ENCOUNTER — Other Ambulatory Visit: Payer: Self-pay | Admitting: Family Medicine

## 2016-04-20 ENCOUNTER — Other Ambulatory Visit: Payer: Self-pay | Admitting: Family Medicine

## 2016-04-20 ENCOUNTER — Other Ambulatory Visit: Payer: Self-pay | Admitting: Nurse Practitioner

## 2016-04-23 ENCOUNTER — Ambulatory Visit (INDEPENDENT_AMBULATORY_CARE_PROVIDER_SITE_OTHER): Payer: Medicare Other | Admitting: Urology

## 2016-04-23 DIAGNOSIS — C680 Malignant neoplasm of urethra: Secondary | ICD-10-CM

## 2016-04-28 ENCOUNTER — Other Ambulatory Visit: Payer: Self-pay | Admitting: Nurse Practitioner

## 2016-04-28 ENCOUNTER — Other Ambulatory Visit: Payer: Self-pay | Admitting: Family Medicine

## 2016-05-01 ENCOUNTER — Other Ambulatory Visit: Payer: Self-pay | Admitting: Family Medicine

## 2016-05-04 ENCOUNTER — Telehealth: Payer: Self-pay | Admitting: Pharmacist

## 2016-05-04 ENCOUNTER — Ambulatory Visit (INDEPENDENT_AMBULATORY_CARE_PROVIDER_SITE_OTHER): Payer: Medicare Other | Admitting: Pharmacist

## 2016-05-04 DIAGNOSIS — I82403 Acute embolism and thrombosis of unspecified deep veins of lower extremity, bilateral: Secondary | ICD-10-CM

## 2016-05-04 DIAGNOSIS — Z86711 Personal history of pulmonary embolism: Secondary | ICD-10-CM

## 2016-05-04 DIAGNOSIS — I4891 Unspecified atrial fibrillation: Secondary | ICD-10-CM

## 2016-05-04 LAB — COAGUCHEK XS/INR WAIVED
INR: 1.4 — ABNORMAL HIGH (ref 0.9–1.1)
Prothrombin Time: 16.9 s

## 2016-05-11 ENCOUNTER — Other Ambulatory Visit: Payer: Self-pay | Admitting: Urology

## 2016-05-18 ENCOUNTER — Ambulatory Visit (INDEPENDENT_AMBULATORY_CARE_PROVIDER_SITE_OTHER): Payer: Medicare Other | Admitting: Pharmacist

## 2016-05-18 DIAGNOSIS — I4891 Unspecified atrial fibrillation: Secondary | ICD-10-CM | POA: Diagnosis not present

## 2016-05-18 DIAGNOSIS — Z86711 Personal history of pulmonary embolism: Secondary | ICD-10-CM

## 2016-05-18 LAB — COAGUCHEK XS/INR WAIVED
INR: 2.7 — ABNORMAL HIGH (ref 0.9–1.1)
Prothrombin Time: 32.3 s

## 2016-05-18 NOTE — Telephone Encounter (Signed)
I have not seen anything on Scott [name]. I called him and he will try to find their card and call me with the number

## 2016-05-18 NOTE — Telephone Encounter (Signed)
Scott [name], please look into this for this patient

## 2016-05-18 NOTE — Progress Notes (Signed)
York Cerise, please find out when home visits are due for these patients and let Tammy know this

## 2016-05-19 ENCOUNTER — Encounter (HOSPITAL_COMMUNITY): Payer: Self-pay | Admitting: *Deleted

## 2016-05-22 ENCOUNTER — Other Ambulatory Visit: Payer: Self-pay | Admitting: Family Medicine

## 2016-05-25 ENCOUNTER — Telehealth: Payer: Self-pay | Admitting: Family Medicine

## 2016-05-25 NOTE — Telephone Encounter (Signed)
Our office needed phone number for orthotics office.  Per patient was office in [place], [place] which I was able to find on line.  [facility] [phone] (confirmed name and number with patient) Will send information to D[facility] to follow up need for Rx for new stump covering.

## 2016-05-31 NOTE — Progress Notes (Signed)
Patient called back to inform me that he cannot come to the hospital to Short Stay tomorrow Tuesday 06/01/2016 at 0630 due to his wife's caregiver arriving at a specific time and he lives 30 miles away. He can be here at 0700 am tomorrow Tuesday 06/01/2016.

## 2016-05-31 NOTE — Progress Notes (Signed)
Called and spoke with Joycelyn Schmid, caregiver of patient and informed her of time change for patient's surgery on Tuesday 06/01/2016 at [facility]. Patient to arrive at Short Stay at 0630 am and nothing to eat or drink after midnight!Caregiver verbalized understanding!

## 2016-05-31 NOTE — H&P (Signed)
CC: I have bladder cancer.  HPI: Scott [name] is a 83 year-old male established patient who is here for bladder cancer.    Scott [name] returns today in f/u. He has a history of NMIBC with urethral involvement. He had a biopsy and fulguration of the urethral bulbar tumor in 10/17 and the path was low grade. He is doing well without hematuria or voiding complaints. His UA has 1+ blood on the dip.      ALLERGIES: No Allergies    MEDICATIONS: Ditropan Xl  Amlodipine Besilate  Benicar  Flomax  Klor-Con  Niaspan  Toprol Xl  Tricon  Zocor     GU PSH: Bladder Instill AntiCA Agent - 07/17/2015 Cysto Bladder Ureth Biopsy - 2013, 2013 Cystoscopy - 11/14/2015 Cystoscopy Fulguration - 01/20/2016 Cystoscopy TURBT 2-5 cm - 07/17/2015, 2013 Cystoscopy TURP - 2013      PSH Notes: Bladder Injection Of Cancer Treatment, Cystoscopy With Fulguration Medium Lesion (2-5cm), Amputation Of Leg Above Knee, Transurethral Resection Of Prostate (TURP), Cystoscopy With Biopsy, Cystoscopy With Biopsy, Cystoscopy With Fulguration Medium Lesion (2-5cm), No Surgical Problems   NON-GU PSH: Amputate Leg At Thigh - 06/06/2015    GU PMH: Urethral Cancer, He has a small papillary urethral cancer. - 11/14/2015 Bladder Cancer Trigone , Malignant neoplasm of trigone of bladder - 08/01/2015 Urinary Urgency, Urinary urgency - 08/01/2015 Gross hematuria, Gross hematuria - 06/06/2015 Bladder Cancer, History, History of bladder cancer - 2015 BPH w/LUTS, Benign prostatic hyperplasia with urinary obstruction - 2014      PMH Notes:  2012-08-11 14:20:22 - Note: Heart Block  2011-09-20 12:53:15 - Note: Arthritis  He was treated for pneumonia in mid July 2017.    NON-GU PMH: Personal history of other diseases of the circulatory system, History of congestive heart disease - 06/06/2015, History of hypertension, - 2014 Acute embolism and thrombosis of unspecified deep veins of unspecified distal lower extremity, Deep vein  thrombosis of distal lower extremity - 8527 Chronic systolic (congestive) heart failure, Chronic systolic congestive heart failure - 2014 DVT, History, H/O blood clots - 2014 Gout, Gout - 2014 Personal history of other diseases of the nervous system and sense organs, History of glaucoma - 2014 Personal history of other endocrine, nutritional and metabolic disease, History of hypercholesterolemia - 2014 Encounter for general adult medical examination without abnormal findings, Encounter for preventive health examination    FAMILY HISTORY: Death In The Family Father - Runs In Family Death In The Family Mother - Runs In Family Family Health Status Number - Runs In Family   SOCIAL HISTORY: None    Notes: Former smoker, Marital History - Currently Married, Caffeine Use, Alcohol Use   REVIEW OF SYSTEMS:    GU Review Male:   Patient reports get up at night to urinate, leakage of urine, and erection problems. Patient denies frequent urination, hard to postpone urination, burning/ pain with urination, stream starts and stops, trouble starting your stream, have to strain to urinate , and penile pain.  Gastrointestinal (Upper):   Patient denies nausea, vomiting, and indigestion/ heartburn.  Gastrointestinal (Lower):   Patient denies diarrhea and constipation.  Constitutional:   Patient denies fever, night sweats, weight loss, and fatigue.  Skin:   Patient denies skin rash/ lesion and itching.  Eyes:   Patient denies blurred vision and double vision.  Ears/ Nose/ Throat:   Patient denies sore throat and sinus problems.  Hematologic/Lymphatic:   Patient denies swollen glands and easy bruising.  Cardiovascular:   Patient  reports leg swelling. Patient denies chest pains.  Respiratory:   Patient denies cough and shortness of breath.  Endocrine:   Patient denies excessive thirst.  Musculoskeletal:   Patient denies back pain and joint pain.  Neurological:   Patient denies headaches and dizziness.   Psychologic:   Patient denies depression and anxiety.   VITAL SIGNS:      04/23/2016 11:30 AM  BP 136/63 mmHg  Pulse 77 /min  Temperature 97.8 F / 37 C   MULTI-SYSTEM PHYSICAL EXAMINATION:    Constitutional: Well-nourished. No physical deformities. Normally developed. Good grooming.  Respiratory: No labored breathing, no use of accessory muscles. CTA  Cardiovascular: Normal temperature, . RRR without murmur.   Musculoskeletal: LAKA.     PAST DATA REVIEWED:  Source Of History:  Patient  Urine Test Review:   Urinalysis   PROCEDURES:         Flexible Cystoscopy - 52000  Risks, benefits, and some of the potential complications of the procedure were discussed. Cipro 550m given for antibiotic prophylaxis.     Meatus:  Normal size. Normal location. Normal condition.  Urethra:  No strictures. There is a small recurrent/persistent papillary tumor in the posterior bulb.   External Sphincter:  Normal.  Verumontanum:  Normal.  Prostate:  He has a patent TUR defect and there was a small papillary lesion in the right anterior prostatic urethra.   Bladder Neck:  Non-obstructing.  Ureteral Orifices:  Normal location. Normal size. Normal shape. Effluxed clear urine.  Bladder:  Mild trabeculation. No tumors. Normal mucosa. No stones.      The procedure was well tolerated and there were no complications.  He has a small recurrent tumor in the bulbar urethra and prostatic urethra.          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    PLAN:           Schedule Return Visit/Planned Activity: Next  Available Appointment - Schedule Surgery          Document Letter(s):  Created for Patient: Clinical Summary         Notes:   I am going to get him set up to have fulguration of the urethral and prostate lesions under MAC.

## 2016-05-31 NOTE — Telephone Encounter (Signed)
Marianna, got them to fax new forms.

## 2016-06-01 ENCOUNTER — Encounter (HOSPITAL_COMMUNITY): Payer: Self-pay | Admitting: Urology

## 2016-06-01 ENCOUNTER — Ambulatory Visit (HOSPITAL_COMMUNITY): Payer: Medicare Other | Admitting: Certified Registered"

## 2016-06-01 ENCOUNTER — Telehealth: Payer: Self-pay | Admitting: Family Medicine

## 2016-06-01 ENCOUNTER — Encounter (HOSPITAL_COMMUNITY)
Admission: RE | Disposition: A | Discharge status: Home / Self Care | Payer: Self-pay | Source: Ambulatory Visit | Attending: Urology

## 2016-06-01 ENCOUNTER — Ambulatory Visit (HOSPITAL_COMMUNITY)
Admission: RE | Admit: 2016-06-01 | Discharge: 2016-06-01 | Disposition: A | Discharge status: Home / Self Care | Payer: Medicare Other | Source: Ambulatory Visit | Attending: Urology | Admitting: Urology

## 2016-06-01 DIAGNOSIS — I739 Peripheral vascular disease, unspecified: Secondary | ICD-10-CM | POA: Insufficient documentation

## 2016-06-01 DIAGNOSIS — I252 Old myocardial infarction: Secondary | ICD-10-CM | POA: Insufficient documentation

## 2016-06-01 DIAGNOSIS — Z79899 Other long term (current) drug therapy: Secondary | ICD-10-CM | POA: Diagnosis not present

## 2016-06-01 DIAGNOSIS — Z87891 Personal history of nicotine dependence: Secondary | ICD-10-CM | POA: Diagnosis not present

## 2016-06-01 DIAGNOSIS — C679 Malignant neoplasm of bladder, unspecified: Secondary | ICD-10-CM | POA: Diagnosis present

## 2016-06-01 DIAGNOSIS — Z86718 Personal history of other venous thrombosis and embolism: Secondary | ICD-10-CM | POA: Diagnosis not present

## 2016-06-01 DIAGNOSIS — C680 Malignant neoplasm of urethra: Secondary | ICD-10-CM | POA: Insufficient documentation

## 2016-06-01 DIAGNOSIS — R351 Nocturia: Secondary | ICD-10-CM | POA: Diagnosis not present

## 2016-06-01 DIAGNOSIS — I2510 Atherosclerotic heart disease of native coronary artery without angina pectoris: Secondary | ICD-10-CM | POA: Diagnosis not present

## 2016-06-01 DIAGNOSIS — Z89612 Acquired absence of left leg above knee: Secondary | ICD-10-CM | POA: Diagnosis not present

## 2016-06-01 DIAGNOSIS — E7800 Pure hypercholesterolemia, unspecified: Secondary | ICD-10-CM | POA: Diagnosis not present

## 2016-06-01 DIAGNOSIS — N401 Enlarged prostate with lower urinary tract symptoms: Secondary | ICD-10-CM | POA: Insufficient documentation

## 2016-06-01 DIAGNOSIS — I351 Nonrheumatic aortic (valve) insufficiency: Secondary | ICD-10-CM | POA: Insufficient documentation

## 2016-06-01 DIAGNOSIS — I10 Essential (primary) hypertension: Secondary | ICD-10-CM | POA: Diagnosis not present

## 2016-06-01 DIAGNOSIS — D494 Neoplasm of unspecified behavior of bladder: Secondary | ICD-10-CM | POA: Diagnosis not present

## 2016-06-01 DIAGNOSIS — I480 Paroxysmal atrial fibrillation: Secondary | ICD-10-CM | POA: Diagnosis not present

## 2016-06-01 DIAGNOSIS — C669 Malignant neoplasm of unspecified ureter: Secondary | ICD-10-CM | POA: Diagnosis not present

## 2016-06-01 HISTORY — PX: CYSTOSCOPY WITH FULGERATION: SHX6638

## 2016-06-01 LAB — BASIC METABOLIC PANEL
Anion gap: 6 (ref 5–15)
BUN: 26 mg/dL — ABNORMAL HIGH (ref 6–20)
CO2: 29 mmol/L (ref 22–32)
Calcium: 9.7 mg/dL (ref 8.9–10.3)
Chloride: 104 mmol/L (ref 101–111)
Creatinine, Ser: 1.37 mg/dL — ABNORMAL HIGH (ref 0.61–1.24)
GFR calc Af Amer: 50 mL/min — ABNORMAL LOW (ref >60)
GFR calc non Af Amer: 43 mL/min — ABNORMAL LOW (ref >60)
Glucose, Bld: 90 mg/dL (ref 65–99)
Potassium: 4.3 mmol/L (ref 3.5–5.1)
Sodium: 139 mmol/L (ref 135–145)

## 2016-06-01 LAB — CBC
HCT: 40.8 % (ref 39.0–52.0)
Hemoglobin: 13.3 g/dL (ref 13.0–17.0)
MCH: 27.3 pg (ref 26.0–34.0)
MCHC: 32.6 g/dL (ref 30.0–36.0)
MCV: 83.6 fL (ref 78.0–100.0)
Platelets: 139 10*3/uL — ABNORMAL LOW (ref 150–400)
RBC: 4.88 MIL/uL (ref 4.22–5.81)
RDW: 15.6 % — ABNORMAL HIGH (ref 11.5–15.5)
WBC: 4.3 10*3/uL (ref 4.0–10.5)

## 2016-06-01 LAB — PROTIME-INR
INR: 1.44
Prothrombin Time: 17.7 seconds — ABNORMAL HIGH (ref 11.4–15.2)

## 2016-06-01 LAB — SURGICAL PCR SCREEN
MRSA, PCR: NEGATIVE
Staphylococcus aureus: NEGATIVE

## 2016-06-01 SURGERY — CYSTOSCOPY, WITH BLADDER FULGURATION
Anesthesia: Monitor Anesthesia Care | Site: Bladder

## 2016-06-01 MED ORDER — SODIUM CHLORIDE 0.9% FLUSH: 3.0000 mL | INTRAVENOUS | Status: DC | PRN

## 2016-06-01 MED ORDER — PROPOFOL 10 MG/ML IV BOLUS
INTRAVENOUS | Status: DC | PRN
  Administered 2016-06-01 (×2): 50 mg via INTRAVENOUS

## 2016-06-01 MED ORDER — ACETAMINOPHEN 650 MG RE SUPP
650.0000 mg | RECTAL | Status: DC | PRN
  Filled 2016-06-01: qty 1

## 2016-06-01 MED ORDER — LIDOCAINE HCL (CARDIAC) 20 MG/ML IV SOLN
INTRAVENOUS | Status: DC | PRN
  Administered 2016-06-01: 100 mg via INTRATRACHEAL

## 2016-06-01 MED ORDER — LACTATED RINGERS IV SOLN
INTRAVENOUS | Status: DC | PRN
  Administered 2016-06-01: 09:00:00 via INTRAVENOUS

## 2016-06-01 MED ORDER — CEFAZOLIN SODIUM-DEXTROSE 2-4 GM/100ML-% IV SOLN
2.0000 g | INTRAVENOUS | Status: AC
  Administered 2016-06-01: 2 g via INTRAVENOUS

## 2016-06-01 MED ORDER — PROPOFOL 500 MG/50ML IV EMUL
INTRAVENOUS | Status: DC | PRN
  Administered 2016-06-01: 50 ug/kg/min via INTRAVENOUS

## 2016-06-01 MED ORDER — CEFAZOLIN SODIUM-DEXTROSE 2-4 GM/100ML-% IV SOLN
INTRAVENOUS | Status: AC
  Filled 2016-06-01: qty 100

## 2016-06-01 MED ORDER — SODIUM CHLORIDE 0.9 % IV SOLN: 250.0000 mL | INTRAVENOUS | Status: DC | PRN

## 2016-06-01 MED ORDER — ACETAMINOPHEN 325 MG PO TABS: 650.0000 mg | ORAL_TABLET | ORAL | Status: DC | PRN

## 2016-06-01 MED ORDER — STERILE WATER FOR INJECTION IJ SOLN
INTRAMUSCULAR | Status: DC | PRN
  Administered 2016-06-01: 3000 mL

## 2016-06-01 MED ORDER — FENTANYL CITRATE (PF) 100 MCG/2ML IJ SOLN
INTRAMUSCULAR | Status: DC | PRN
  Administered 2016-06-01: 25 ug via INTRAVENOUS

## 2016-06-01 MED ORDER — LIDOCAINE 2% (20 MG/ML) 5 ML SYRINGE
INTRAMUSCULAR | Status: AC
  Filled 2016-06-01: qty 5

## 2016-06-01 MED ORDER — PROPOFOL 10 MG/ML IV BOLUS
INTRAVENOUS | Status: AC
  Filled 2016-06-01: qty 80

## 2016-06-01 MED ORDER — FENTANYL CITRATE (PF) 100 MCG/2ML IJ SOLN: 25.0000 ug | INTRAMUSCULAR | Status: DC | PRN

## 2016-06-01 MED ORDER — OXYCODONE HCL 5 MG PO TABS: 5.0000 mg | ORAL_TABLET | ORAL | Status: DC | PRN

## 2016-06-01 MED ORDER — FENTANYL CITRATE (PF) 100 MCG/2ML IJ SOLN
INTRAMUSCULAR | Status: AC
  Filled 2016-06-01: qty 2

## 2016-06-01 MED ORDER — ONDANSETRON HCL 4 MG/2ML IJ SOLN
INTRAMUSCULAR | Status: DC | PRN
  Administered 2016-06-01: 4 mg via INTRAVENOUS

## 2016-06-01 MED ORDER — PROMETHAZINE HCL 25 MG/ML IJ SOLN: 6.2500 mg | INTRAMUSCULAR | Status: DC | PRN

## 2016-06-01 MED ORDER — METOPROLOL SUCCINATE ER 50 MG PO TB24
50.0000 mg | ORAL_TABLET | Freq: Every day | ORAL | Status: AC
  Administered 2016-06-01: 50 mg via ORAL
  Filled 2016-06-01: qty 1

## 2016-06-01 MED ORDER — DEXAMETHASONE SODIUM PHOSPHATE 10 MG/ML IJ SOLN
INTRAMUSCULAR | Status: DC | PRN
  Administered 2016-06-01: 10 mg via INTRAVENOUS

## 2016-06-01 MED ORDER — SODIUM CHLORIDE 0.9% FLUSH: 3.0000 mL | Freq: Two times a day (BID) | INTRAVENOUS | Status: DC

## 2016-06-01 SURGICAL SUPPLY — 15 items
BAG URINE DRAINAGE (UROLOGICAL SUPPLIES) IMPLANT
BAG URO CATCHER STRL LF (MISCELLANEOUS) ×2 IMPLANT
CATH FOLEY 3WAY 30CC 22FR (CATHETERS) IMPLANT
CATH URET 5FR 28IN OPEN ENDED (CATHETERS) IMPLANT
GLOVE SURG SS PI 8.0 STRL IVOR (GLOVE) IMPLANT
GOWN STRL REUS W/TWL XL LVL3 (GOWN DISPOSABLE) ×2 IMPLANT
HOLDER FOLEY CATH W/STRAP (MISCELLANEOUS) IMPLANT
LOOP CUT BIPOLAR 24F LRG (ELECTROSURGICAL) IMPLANT
MANIFOLD NEPTUNE II (INSTRUMENTS) ×2 IMPLANT
PACK CYSTO (CUSTOM PROCEDURE TRAY) ×2 IMPLANT
SET ASPIRATION TUBING (TUBING) IMPLANT
SUT ETHILON 3 0 PS 1 (SUTURE) IMPLANT
SYR 30ML LL (SYRINGE) IMPLANT
SYRINGE IRR TOOMEY STRL 70CC (SYRINGE) IMPLANT
TUBING CONNECTING 10 (TUBING) ×2 IMPLANT

## 2016-06-01 NOTE — Anesthesia Postprocedure Evaluation (Addendum)
Anesthesia Post Note  Patient: Scott [name]  Procedure(s) Performed: Procedure(s) (LRB): CYSTOSCOPY WITH FULGERATION urethral tumors and bladder neck (N/A)  Patient location during evaluation: PACU Anesthesia Type: MAC Level of consciousness: awake and alert Pain management: pain level controlled Vital Signs Assessment: post-procedure vital signs reviewed and stable Respiratory status: spontaneous breathing, nonlabored ventilation, respiratory function stable and patient connected to nasal cannula oxygen Cardiovascular status: stable and blood pressure returned to baseline Anesthetic complications: no       Last Vitals:  Vitals:   06/01/16 1030 06/01/16 1045  BP: (!) 152/74 (!) 152/80  Pulse: 65 63  Resp: 17 16  Temp:      Last Pain:  Vitals:   06/01/16 1045  TempSrc:   PainSc: 0-No pain                 Rayland Hamed S

## 2016-06-01 NOTE — Discharge Instructions (Addendum)
CYSTOSCOPY HOME CARE INSTRUCTIONS  Activity: Rest for the remainder of the day.  Do not drive or operate equipment today.  You may resume normal activities in one to two days as instructed by your physician.   Meals: Drink plenty of liquids and eat light foods such as gelatin or soup this evening.  You may return to a normal meal plan tomorrow.  Return to Work: You may return to work in one to two days or as instructed by your physician.  Special Instructions / Symptoms: Call your physician if any of these symptoms occur:   -persistent or heavy bleeding  -bleeding which continues after first few urination  -large blood clots that are difficult to pass  -urine stream diminishes or stops completely  -fever equal to or higher than 101 degrees Farenheit.  -cloudy urine with a strong, foul odor  -severe pain   Moderate Conscious Sedation, Adult, Care After These instructions provide you with information about caring for yourself after your procedure. Your health care provider may also give you more specific instructions. Your treatment has been planned according to current medical practices, but problems sometimes occur. Call your health care provider if you have any problems or questions after your procedure. What can I expect after the procedure? After your procedure, it is common:  To feel sleepy for several hours.  To feel clumsy and have poor balance for several hours.  To have poor judgment for several hours.  To vomit if you eat too soon. Follow these instructions at home: For at least 24 hours after the procedure:   Do not:  Participate in activities where you could fall or become injured.  Drive.  Use heavy machinery.  Drink alcohol.  Take sleeping pills or medicines that cause drowsiness.  Make important decisions or sign legal documents.  Take care of children on your own.  Rest. Eating and drinking  Follow the diet recommended by your health care  provider.  If you vomit:  Drink water, juice, or soup when you can drink without vomiting.  Make sure you have little or no nausea before eating solid foods. General instructions  Have a responsible adult stay with you until you are awake and alert.  Take over-the-counter and prescription medicines only as told by your health care provider.  If you smoke, do not smoke without supervision.  Keep all follow-up visits as told by your health care provider. This is important. Contact a health care provider if:  You keep feeling nauseous or you keep vomiting.  You feel light-headed.  You develop a rash.  You have a fever. Get help right away if:  You have trouble breathing. This information is not intended to replace advice given to you by your health care provider. Make sure you discuss any questions you have with your health care provider. Document Released: 01/17/2013 Document Revised: 09/01/2015 Document Reviewed: 07/19/2015 Elsevier Interactive Patient Education  2017 [employer].

## 2016-06-01 NOTE — Op Note (Signed)
Scott [name], Scott [name] NO.:  [id number]  MEDICAL RECORD NO.:  [id number]  LOCATION:                               FACILITY:  [facility]  PHYSICIAN:  Marshall Cork. Jeffie Pollock, M.D.    DATE OF BIRTH:  08-28-1921  DATE OF PROCEDURE:  06/01/2016 DATE OF DISCHARGE:                              OPERATIVE REPORT   PROCEDURE:  Cystoscopy with fulguration of urethral and bladder neck tumors.  PREOPERATIVE DIAGNOSIS:  Recurrent urethral carcinoma.  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  SURGEON:  Marshall Cork. Jeffie Pollock, M.D.  ANESTHESIA:  MAC.  SPECIMEN:  None.  DRAINS:  None.  BLOOD LOSS:  None.  COMPLICATIONS:  None.  INDICATIONS:  [name] is a 83 year old male with a history of urothelial carcinoma, who originally was treated for bladder disease, but he has subsequently developed urethral recurrences and returns today for cystoscopy and fulguration.  FINDINGS OF PROCEDURE:  He was taken to the operating room where he was placed on the table in the lithotomy position.  He had PAS hose placed on the right leg, the left has an AKA.  He was given Ancef.  Sedation was given as needed.  He was prepped with Betadine solution and draped in the usual sterile fashion.  Cystoscopy was performed using a 23-French scope and 30-degree lens. Examination revealed a normal urethra with the exception of a small papillary tumor in the mid bulb posteriorly.  The external sphincter was intact.  The prostatic urethra had evidence of prior resection with some right lobe regrowth at the bladder neck.  Tucked under the regrowth was a papillary tumor that was approximately 1 cm in size.  Examination of the bladder revealed mild trabeculation.  There was a stellate scar on the right base of the bladder.  The ureteral orifices were unremarkable.  One small erythematous lesion was noted on the posterior wall.  A Bugbee electrode was  used with water as irrigant, and the lesion on the posterior wall of the bladder was fulgurated.  I then fulgurated the lesion at the bladder neck and finally fulgurated the urethral lesion. At this point, the bladder was drained.  Inspection revealed no bleeding.  The scope was removed.  The patient was taken down from lithotomy position. Anesthetic was reversed.  He was moved to recovery in stable condition. There were no complications.     Marshall Cork. Jeffie Pollock, M.D.   ______________________________ Marshall Cork. Jeffie Pollock, M.D.    JJW/MEDQ  D:  06/01/2016  T:  06/01/2016  Job:  [id number]

## 2016-06-01 NOTE — Op Note (Deleted)
  The note originally documented on this encounter has been moved the the encounter in which it belongs.  

## 2016-06-01 NOTE — Telephone Encounter (Signed)
Form and ov notes faxed

## 2016-06-01 NOTE — Brief Op Note (Signed)
06/01/2016  9:50 AM  PATIENT:  Scott [name]  83 y.o. male  PRE-OPERATIVE DIAGNOSIS:  RECURRENT URETHRAL CANCER  POST-OPERATIVE DIAGNOSIS:  recurrent urethral cancer  PROCEDURE:  Procedure(s): CYSTOSCOPY WITH FULGERATION urethral tumors and bladder neck (N/A)  SURGEON:  Surgeon(s) and Role:    * [name], MD - Primary  PHYSICIAN ASSISTANT:   ASSISTANTS: none   ANESTHESIA:   MAC  EBL:  No intake/output data recorded.  BLOOD ADMINISTERED:none  DRAINS: none   LOCAL MEDICATIONS USED:  NONE  SPECIMEN:  No Specimen  DISPOSITION OF SPECIMEN:  N/A  COUNTS:  YES  TOURNIQUET:  * No tourniquets in log *  DICTATION: .Other Dictation: Dictation Number [id number]  PLAN OF CARE: Discharge to home after PACU  PATIENT DISPOSITION:  PACU - hemodynamically stable.   Delay start of Pharmacological VTE agent (>24hrs) due to surgical blood loss or risk of bleeding: not applicable

## 2016-06-01 NOTE — Addendum Note (Signed)
Addendum  created 06/01/16 1219 by Pilar Grammes, CRNA   Anesthesia Intra Flowsheets edited

## 2016-06-01 NOTE — Addendum Note (Signed)
Addendum  created 06/01/16 1216 by Pilar Grammes, CRNA   Anesthesia Intra Flowsheets edited

## 2016-06-01 NOTE — Transfer of Care (Signed)
Immediate Anesthesia Transfer of Care Note  Patient: Scott [name]  Procedure(s) Performed: Procedure(s): CYSTOSCOPY WITH FULGERATION urethral tumors and bladder neck (N/A)  Patient Location: PACU  Anesthesia Type:MAC  Level of Consciousness: sedated and responds to stimulation  Airway & Oxygen Therapy: Patient Spontanous Breathing and Patient connected to face mask oxygen  Post-op Assessment: Report given to RN and Post -op Vital signs reviewed and stable  Post vital signs: stable  Last Vitals:  Vitals:   06/01/16 0800  BP: (!) 150/82  Pulse: 66  Resp: 18  Temp: 37 C    Last Pain:  Vitals:   06/01/16 0800  TempSrc: Oral      Patients Stated Pain Goal: 4 (02/26/34 6701)  Complications: No apparent anesthesia complications

## 2016-06-01 NOTE — Telephone Encounter (Signed)
Yes, faxed it back with ov notes today

## 2016-06-01 NOTE — Anesthesia Preprocedure Evaluation (Signed)
Anesthesia Evaluation  Patient identified by MRN, date of birth, ID band Patient awake    Reviewed: Allergy & Precautions, NPO status , Patient's Chart, lab work & pertinent test results  Airway Mallampati: II  TM Distance: >3 FB Neck ROM: Full    Dental no notable dental hx.    Pulmonary neg pulmonary ROS, former smoker,    Pulmonary exam normal breath sounds clear to auscultation       Cardiovascular hypertension, + CAD, + Past MI, + Peripheral Vascular Disease and +CHF  Normal cardiovascular exam Rhythm:Regular Rate:Normal  Left ventricle:  Abnormal septal motion The cavity size was normal. There was mild concentric hypertrophy. Systolic function was normal. The estimated ejection fraction was in the range of 50% to 55%. Doppler parameters are consistent with abnormal left ventricular relaxation (grade 1 diastolic dysfunction).  ------------------------------------------------------------------- Aortic valve:  Sclerosis without stenosis.  Doppler:  There was trivial regurgitation.    Neuro/Psych negative neurological ROS  negative psych ROS   GI/Hepatic negative GI ROS, Neg liver ROS,   Endo/Other  negative endocrine ROS  Renal/GU negative Renal ROS  negative genitourinary   Musculoskeletal negative musculoskeletal ROS (+)   Abdominal   Peds negative pediatric ROS (+)  Hematology negative hematology ROS (+)   Anesthesia Other Findings   Reproductive/Obstetrics negative OB ROS                             Anesthesia Physical Anesthesia Plan  ASA: III  Anesthesia Plan: MAC   Post-op Pain Management:    Induction: Intravenous  Airway Management Planned: Simple Face Mask  Additional Equipment:   Intra-op Plan:   Post-operative Plan:   Informed Consent: I have reviewed the patients History and Physical, chart, labs and discussed the procedure including the risks,  benefits and alternatives for the proposed anesthesia with the patient or authorized representative who has indicated his/her understanding and acceptance.   Dental advisory given  Plan Discussed with: CRNA and Surgeon  Anesthesia Plan Comments:         Anesthesia Quick Evaluation

## 2016-06-01 NOTE — Interval H&P Note (Signed)
History and Physical Interval Note:  06/01/2016 9:32 AM  Scott [name]  has presented today for surgery, with the diagnosis of RECURRENT URETERAL CANCER  The various methods of treatment have been discussed with the patient and family. After consideration of risks, benefits and other options for treatment, the patient has consented to  Procedure(s): [place] (N/A) as a surgical intervention .  The patient's history has been reviewed, patient examined, no change in status, stable for surgery.  I have reviewed the patient's chart and labs.  Questions were answered to the patient's satisfaction.     Christoph Copelan J

## 2016-06-11 ENCOUNTER — Ambulatory Visit (INDEPENDENT_AMBULATORY_CARE_PROVIDER_SITE_OTHER): Payer: Medicare Other | Admitting: Urology

## 2016-06-11 DIAGNOSIS — R3915 Urgency of urination: Secondary | ICD-10-CM

## 2016-06-11 DIAGNOSIS — C679 Malignant neoplasm of bladder, unspecified: Secondary | ICD-10-CM | POA: Diagnosis not present

## 2016-06-11 DIAGNOSIS — C680 Malignant neoplasm of urethra: Secondary | ICD-10-CM

## 2016-06-15 ENCOUNTER — Ambulatory Visit (INDEPENDENT_AMBULATORY_CARE_PROVIDER_SITE_OTHER): Payer: Medicare Other | Admitting: Pharmacist

## 2016-06-15 DIAGNOSIS — Z86711 Personal history of pulmonary embolism: Secondary | ICD-10-CM | POA: Diagnosis not present

## 2016-06-15 DIAGNOSIS — I4891 Unspecified atrial fibrillation: Secondary | ICD-10-CM

## 2016-06-15 LAB — COAGUCHEK XS/INR WAIVED
INR: 1.5 — ABNORMAL HIGH (ref 0.9–1.1)
Prothrombin Time: 18.4 s

## 2016-07-01 ENCOUNTER — Ambulatory Visit (INDEPENDENT_AMBULATORY_CARE_PROVIDER_SITE_OTHER): Payer: Medicare Other | Admitting: Pharmacist

## 2016-07-01 DIAGNOSIS — Z86711 Personal history of pulmonary embolism: Secondary | ICD-10-CM | POA: Diagnosis not present

## 2016-07-01 DIAGNOSIS — I4891 Unspecified atrial fibrillation: Secondary | ICD-10-CM | POA: Diagnosis not present

## 2016-07-01 LAB — COAGUCHEK XS/INR WAIVED
INR: 2.4 — ABNORMAL HIGH (ref 0.9–1.1)
Prothrombin Time: 28.3 s

## 2016-07-01 MED ORDER — FLUTICASONE PROPIONATE 50 MCG/ACT NA SUSP: 1.0000 | Freq: Every day | NASAL | 2 refills | Status: DC | PRN

## 2016-07-05 ENCOUNTER — Ambulatory Visit: Payer: Medicare Other | Admitting: Family Medicine

## 2016-07-05 ENCOUNTER — Encounter: Payer: Self-pay | Admitting: Family Medicine

## 2016-07-05 VITALS — BP 122/60 | HR 72 | Temp 98.0°F

## 2016-07-05 DIAGNOSIS — I4891 Unspecified atrial fibrillation: Secondary | ICD-10-CM | POA: Diagnosis not present

## 2016-07-05 DIAGNOSIS — I214 Non-ST elevation (NSTEMI) myocardial infarction: Secondary | ICD-10-CM | POA: Diagnosis not present

## 2016-07-05 DIAGNOSIS — K219 Gastro-esophageal reflux disease without esophagitis: Secondary | ICD-10-CM | POA: Diagnosis not present

## 2016-07-05 DIAGNOSIS — I739 Peripheral vascular disease, unspecified: Secondary | ICD-10-CM | POA: Diagnosis not present

## 2016-07-05 DIAGNOSIS — Z89612 Acquired absence of left leg above knee: Secondary | ICD-10-CM | POA: Diagnosis not present

## 2016-07-05 DIAGNOSIS — E559 Vitamin D deficiency, unspecified: Secondary | ICD-10-CM

## 2016-07-05 DIAGNOSIS — I10 Essential (primary) hypertension: Secondary | ICD-10-CM

## 2016-07-05 DIAGNOSIS — C679 Malignant neoplasm of bladder, unspecified: Secondary | ICD-10-CM

## 2016-07-05 DIAGNOSIS — E7800 Pure hypercholesterolemia, unspecified: Secondary | ICD-10-CM | POA: Diagnosis not present

## 2016-07-05 DIAGNOSIS — J301 Allergic rhinitis due to pollen: Secondary | ICD-10-CM

## 2016-07-05 DIAGNOSIS — Z86718 Personal history of other venous thrombosis and embolism: Secondary | ICD-10-CM

## 2016-07-05 NOTE — Progress Notes (Signed)
Subjective:    Patient ID: Scott [name], male    DOB: Nov 24, 1921, 83 y.o.   MRN: [id number]  HPI Pt was seen today in his home for follow up and management of chronic medical problems which includes hyperlipidemia and hypertension. He is taking medication regularly. He is accompanied today by his son and a caregiver. He is in good spirits.  This patient has a long history with our practice and with me. He has a history of hypertension bladder cancer prostate cancer peripheral artery disease gangrene of the toes secondary to peripheral artery disease with subsequent below the knee amputation on the left. He sees the cardiologist because of the history of heart disease and a non-ST segment elevated myocardial infarction in the past. He also has a history of pulmonary embolism. The patient today is just frustrated because he's not been able to wear his prosthesis on his left leg due to a malfitting prosthesis. The prosthetic person is coming back to try to refit it so that he can start walking again. If he cannot help him with his walking the caregiver for that this patient will call us and we will arrange for physical therapy to come in the home with the newly fitted prosthesis to help him with walking again. When the weather warms up more and the flu epidemic dies down we will arrange for him to come next-door to the rehabilitation center for further physical therapy. The patient denies any trouble with chest pain or shortness of breath. He denies any problems with swallowing his food heartburn indigestion bowel movements for controlling his water even though he continues to see the urologist because of the bladder cancer.   Patient Active Problem List   Diagnosis Date Noted  . Essential hypertension 11/29/2015  . Bradycardia 11/28/2015  . Malignant neoplasm of lateral wall of urinary bladder ([place]) 07/10/2015  . Status post above knee amputation of left lower extremity ([place]) 10/18/2014  .  Tachyarrhythmia 10/05/2014  . Aspiration pneumonia ([place]) 10/05/2014  . UTI (lower urinary tract infection) 10/03/2014  . Sepsis due to other etiology ([place]) 10/03/2014  . Gangrene of toe ([place]) 10/03/2014  . Ischemic ulcer of left foot ([place]) 10/03/2014  . Atherosclerotic PVD with ulceration ([place]) 10/03/2014  . Warfarin-induced coagulopathy ([place]) 10/03/2014  . Atrial fibrillation with RVR ([place]) 10/03/2014  . Acute kidney injury ([place]) 10/03/2014  . Hyperkalemia 10/03/2014  . Cellulitis of left leg 10/03/2014  . PAD (peripheral artery disease) ([place]) 09/24/2014  . NSTEMI (non-ST elevated myocardial infarction) ([place]) 10/09/2013  . High risk medication use 05/17/2013  . BPH (benign prostatic hyperplasia) 05/17/2013  . Vitamin D deficiency 05/17/2013  . History of pulmonary embolism (2014) 04/17/2013  . NSVT (nonsustained ventricular tachycardia)- not an ICD candidate 04/17/2013  . NICM (nonischemic cardiomyopathy)- EF 20-25% by Echo 04/12/13 04/17/2013  . Chronic diastolic heart failure, NYHA class 1 ([place]) 03/15/2013  . DVT of lower extremity, bilateral ([place]) 03/15/2013  . Fatigue 11/02/2012  . Constipation 11/02/2012  . Alcohol abuse 08/27/2012  . Syncope 06/21/2012  . Bladder cancer ([place]) 03/15/2012  . Benign localized hyperplasia of prostate with urinary obstruction 03/15/2012  . Benign essential HTN   . Hyperlipidemia   . PVCs (premature ventricular contractions)   . CAD- non obstructive disease by cath 3/14    Outpatient Encounter Prescriptions as of 07/05/2016  Medication Sig  . acetaminophen (TYLENOL) 500 MG tablet Take 500 mg by mouth every 6 (six) hours as needed for moderate pain or headache.  Marland Kitchen  donepezil (ARICEPT) 10 MG tablet TAKE ONE TABLET AT BEDTIME  . ferrous sulfate 325 (65 FE) MG tablet Take 1 tablet (325 mg total) by mouth daily with breakfast.  . fluticasone (FLONASE) 50 MCG/ACT nasal spray Place 1 spray into both nostrils daily as needed for allergies or rhinitis.  .  folic acid (FOLVITE) 1 MG tablet TAKE 1 TABLET DAILY  . furosemide (LASIX) 40 MG tablet TAKE 1 TABLET EVERY DAY  . gabapentin (NEURONTIN) 100 MG capsule TAKE (2) CAPSULES TWICE DAILY.  . isosorbide dinitrate (ISORDIL) 30 MG tablet TAKE 1 TABLET ONCE A DAY  . metoprolol succinate (TOPROL-XL) 50 MG 24 hr tablet TAKE 1 TABLET DAILY  . nitroGLYCERIN (NITROSTAT) 0.4 MG SL tablet Place 1 tablet (0.4 mg total) under the tongue every 5 (five) minutes as needed for chest pain (up to 3 doses).  . pantoprazole (PROTONIX) 40 MG tablet TAKE 1 TABLET DAILY  . sertraline (ZOLOFT) 50 MG tablet TAKE 1 TABLET IN THE MORNING  . simvastatin (ZOCOR) 10 MG tablet TAKE 1 TABLET DAILY  . tamsulosin (FLOMAX) 0.4 MG CAPS capsule TAKE (1) CAPSULE DAILY  . ULORIC 40 MG tablet TAKE 1 TABLET DAILY  . warfarin (COUMADIN) 2 MG tablet TAKE 1 TABLET ALONG WITH 2.5MG TO EQUAL 4.5 MG DAILY  . warfarin (COUMADIN) 2.5 MG tablet TAKE 1 TABLET DAILY WITH 2 MG AS DIRECTED BY CPP (Patient taking differently: TAKE 1 TABLET DAILY WITH 2 MG ALONG WITH 2MG TO EQUAL 4.5MG DAILY)   No facility-administered encounter medications on file as of 07/05/2016.       Review of Systems  Constitutional: Negative.   HENT: Negative.   Eyes: Negative.   Respiratory: Negative.   Cardiovascular: Positive for leg swelling (right lower extremity).  Gastrointestinal: Negative.   Endocrine: Negative.   Genitourinary: Negative.   Musculoskeletal: Negative.   Skin: Negative.   Allergic/Immunologic: Negative.   Neurological: Negative.   Hematological: Negative.   Psychiatric/Behavioral: Negative.        Objective:   Physical Exam  Constitutional: He is oriented to person, place, and time. He appears well-developed and well-nourished. No distress.  He was examined while he was sitting in his wheelchair. We did look at his good foot and leg. The patient is pleasant but elderly but alert.  HENT:  Head: Normocephalic and atraumatic.  Nose: Nose  normal.  Mouth/Throat: Oropharynx is clear and moist. No oropharyngeal exudate.  Eyes: Conjunctivae and EOM are normal. Pupils are equal, round, and reactive to light. Right eye exhibits no discharge. Left eye exhibits no discharge. No scleral icterus.  Neck: Normal range of motion. Neck supple. No thyromegaly present.  No bruits thyromegaly or anterior cervical adenopathy  Cardiovascular: Normal rate, regular rhythm and normal heart sounds.   No murmur heard. Heart was slightly irregular today at 72/m.  Pulmonary/Chest: Effort normal and breath sounds normal. No respiratory distress. He has no wheezes. He has no rales.  Clear anteriorly and posteriorly  Abdominal: Soft. Bowel sounds are normal. He exhibits no mass. There is no tenderness. There is no rebound and no guarding.  The abdomen was soft without liver or spleen enlargement no bruits and no abdominal masses were palpable.  Musculoskeletal: Normal range of motion. He exhibits edema and deformity. He exhibits no tenderness.  There is slight edema on the right lower extremity about 1+ pretibial and 2+ pedal. The patient is in a wheelchair because of his below the knee amputation on the left and he is waiting refitting  of his prosthesis so that he can get up and try to walk again.  Lymphadenopathy:    He has no cervical adenopathy.  Neurological: He is alert and oriented to person, place, and time. He has normal reflexes. No cranial nerve deficit.  Skin: Skin is warm and dry. No rash noted.  The right lower extremity although there was some edema did not appear to have any cellulitis or infection and there was no drainage between the toes.  Psychiatric: He has a normal mood and affect. His behavior is normal. Judgment and thought content normal.  Nursing note and vitals reviewed.   BP 122/60 (BP Location: Left Arm)   Pulse 72   Temp 98 F (36.7 C) (Oral)   About 40 minutes of time was spent with this patient off and on during the  visit with face-to-face contact.     Assessment & Plan:  1. Pure hypercholesterolemia -Continue with simvastatin pending results of lab work. The patient is due to get a ProTime and we'll get his lab work done when he comes in for his pro time visit. - CBC with Differential/Platelet; Future - Lipid panel; Future  2. Essential hypertension -The blood pressure is good today and he will continue with current treatment - CBC with Differential/Platelet; Future - BMP8+EGFR; Future - Hepatic function panel; Future  3. Gastroesophageal reflux disease, esophagitis presence not specified -Patient has no complaints with reflux disease today. He will continue with his pantoprazole. - CBC with Differential/Platelet; Future - Hepatic function panel; Future  4. Vitamin D deficiency -Continue with vitamin D replacement pending results of lab work - VITAMIN D 25 Hydroxy (Vit-D Deficiency, Fractures); Future  5. History of DVT (deep vein thrombosis) -Continue with Coumadin therapy.  6. Atrial fibrillation, unspecified type [facility]) -Follow-up with cardiology as planned  7. Malignant neoplasm of urinary bladder, unspecified site [facility]) -Follow-up with urology as planned  8. Peripheral vascular insufficiency (HCC) -Monitor right extremity closely by caregiver  9. S/P AKA (above knee amputation) unilateral, left (HCC) -Get prosthesis refitting and hopefully get patient up and ambulating again.  10. NSTEMI (non-ST elevated myocardial infarction) ([place]) -All up with cardiology as planned  11. Allergic rhinitis due to pollen, unspecified chronicity, unspecified seasonality -Use Flonase regularly and nightly  No orders of the defined types were placed in this encounter.  Patient Instructions                       Medicare Annual Wellness Visit  [facility] and the medical providers at [facility] strive to bring you the best medical care.  In doing so we not only want to  address your current medical conditions and concerns but also to detect new conditions early and prevent illness, disease and health-related problems.    Medicare offers a yearly Wellness Visit which allows our clinical staff to assess your need for preventative services including immunizations, lifestyle education, counseling to decrease risk of preventable diseases and screening for fall risk and other medical concerns.    This visit is provided free of charge (no copay) for all Medicare recipients. The clinical pharmacists at [facility] have begun to conduct these Wellness Visits which will also include a thorough review of all your medications.    As you primary medical provider recommend that you make an appointment for your Annual Wellness Visit if you have not done so already this year.  You may set up this appointment before you  leave today or you may call back ([phone]) and schedule an appointment.  Please make sure when you call that you mention that you are scheduling your Annual Wellness Visit with the clinical pharmacist so that the appointment may be made for the proper length of time.      Continue current medications. Continue good therapeutic lifestyle changes which include good diet and exercise. Fall precautions discussed with patient. If an FOBT was given today- please return it to our front desk. If you are over 48 years old - you may need Prevnar 20 or the adult Pneumonia vaccine.  **Flu shots are available--- please call and schedule a FLU-CLINIC appointment**  After your visit with [place] today you will receive a survey in the mail or online from [employer] regarding your care with [place]. Please take a moment to fill this out. Your feedback is very important to [place] as you can help [place] better understand your patient needs as well as improve your experience and satisfaction. WE CARE ABOUT YOU!!!      Arrie Senate MD

## 2016-07-05 NOTE — Patient Instructions (Addendum)
Medicare Annual Wellness Visit  [facility] and the medical providers at [facility] strive to bring you the best medical care.  In doing so we not only want to address your current medical conditions and concerns but also to detect new conditions early and prevent illness, disease and health-related problems.    Medicare offers a yearly Wellness Visit which allows our clinical staff to assess your need for preventative services including immunizations, lifestyle education, counseling to decrease risk of preventable diseases and screening for fall risk and other medical concerns.    This visit is provided free of charge (no copay) for all Medicare recipients. The clinical pharmacists at [facility] have begun to conduct these Wellness Visits which will also include a thorough review of all your medications.    As you primary medical provider recommend that you make an appointment for your Annual Wellness Visit if you have not done so already this year.  You may set up this appointment before you leave today or you may call back ([phone]) and schedule an appointment.  Please make sure when you call that you mention that you are scheduling your Annual Wellness Visit with the clinical pharmacist so that the appointment may be made for the proper length of time.      Continue current medications. Continue good therapeutic lifestyle changes which include good diet and exercise. Fall precautions discussed with patient. If an FOBT was given today- please return it to our front desk. If you are over 65 years old - you may need Prevnar 50 or the adult Pneumonia vaccine.  **Flu shots are available--- please call and schedule a FLU-CLINIC appointment**  After your visit with [place] today you will receive a survey in the mail or online from [employer] regarding your care with [place]. Please take a moment to fill this out. Your feedback is very  important to [place] as you can help [place] better understand your patient needs as well as improve your experience and satisfaction. WE CARE ABOUT YOU!!!  Continue to follow-up with cardiology and urology. Plan to do rehabilitation [place] once the flu season has resolved

## 2016-07-06 ENCOUNTER — Other Ambulatory Visit: Payer: Self-pay | Admitting: Family Medicine

## 2016-07-16 ENCOUNTER — Other Ambulatory Visit: Payer: Self-pay | Admitting: Family Medicine

## 2016-07-20 ENCOUNTER — Other Ambulatory Visit: Payer: Self-pay | Admitting: Family Medicine

## 2016-08-03 ENCOUNTER — Ambulatory Visit (INDEPENDENT_AMBULATORY_CARE_PROVIDER_SITE_OTHER): Payer: Medicare Other | Admitting: Pharmacist

## 2016-08-03 DIAGNOSIS — K219 Gastro-esophageal reflux disease without esophagitis: Secondary | ICD-10-CM

## 2016-08-03 DIAGNOSIS — I4891 Unspecified atrial fibrillation: Secondary | ICD-10-CM

## 2016-08-03 DIAGNOSIS — R945 Abnormal results of liver function studies: Principal | ICD-10-CM

## 2016-08-03 DIAGNOSIS — I10 Essential (primary) hypertension: Secondary | ICD-10-CM

## 2016-08-03 DIAGNOSIS — E559 Vitamin D deficiency, unspecified: Secondary | ICD-10-CM

## 2016-08-03 DIAGNOSIS — E7800 Pure hypercholesterolemia, unspecified: Secondary | ICD-10-CM

## 2016-08-03 DIAGNOSIS — Z86711 Personal history of pulmonary embolism: Secondary | ICD-10-CM | POA: Diagnosis not present

## 2016-08-03 DIAGNOSIS — R7989 Other specified abnormal findings of blood chemistry: Secondary | ICD-10-CM | POA: Diagnosis not present

## 2016-08-03 DIAGNOSIS — I82423 Acute embolism and thrombosis of iliac vein, bilateral: Secondary | ICD-10-CM | POA: Diagnosis not present

## 2016-08-03 LAB — COAGUCHEK XS/INR WAIVED
INR: 1.5 — ABNORMAL HIGH (ref 0.9–1.1)
Prothrombin Time: 18 s

## 2016-08-04 ENCOUNTER — Encounter: Payer: Self-pay | Admitting: Family Medicine

## 2016-08-04 DIAGNOSIS — N1831 Chronic kidney disease, stage 3a: Secondary | ICD-10-CM | POA: Insufficient documentation

## 2016-08-04 DIAGNOSIS — D696 Thrombocytopenia, unspecified: Secondary | ICD-10-CM | POA: Insufficient documentation

## 2016-08-04 DIAGNOSIS — N183 Chronic kidney disease, stage 3 (moderate): Secondary | ICD-10-CM

## 2016-08-04 LAB — CBC WITH DIFFERENTIAL/PLATELET
Basophils Absolute: 0 10*3/uL (ref 0.0–0.2)
Basos: 0 %
EOS (ABSOLUTE): 0.1 10*3/uL (ref 0.0–0.4)
Eos: 1 %
Hematocrit: 41.6 % (ref 37.5–51.0)
Hemoglobin: 13.3 g/dL (ref 13.0–17.7)
Immature Grans (Abs): 0 10*3/uL (ref 0.0–0.1)
Immature Granulocytes: 0 %
Lymphocytes Absolute: 1.6 10*3/uL (ref 0.7–3.1)
Lymphs: 36 %
MCH: 27 pg (ref 26.6–33.0)
MCHC: 32 g/dL (ref 31.5–35.7)
MCV: 85 fL (ref 79–97)
Monocytes Absolute: 0.4 10*3/uL (ref 0.1–0.9)
Monocytes: 9 %
Neutrophils Absolute: 2.4 10*3/uL (ref 1.4–7.0)
Neutrophils: 54 %
Platelets: 143 10*3/uL — ABNORMAL LOW (ref 150–379)
RBC: 4.92 x10E6/uL (ref 4.14–5.80)
RDW: 16.6 % — ABNORMAL HIGH (ref 12.3–15.4)
WBC: 4.5 10*3/uL (ref 3.4–10.8)

## 2016-08-04 LAB — BMP8+EGFR
BUN/Creatinine Ratio: 17 (ref 10–24)
BUN: 22 mg/dL (ref 10–36)
CO2: 26 mmol/L (ref 18–29)
Calcium: 9.7 mg/dL (ref 8.6–10.2)
Chloride: 100 mmol/L (ref 96–106)
Creatinine, Ser: 1.3 mg/dL — ABNORMAL HIGH (ref 0.76–1.27)
GFR calc Af Amer: 55 mL/min/{1.73_m2} — ABNORMAL LOW (ref >59)
GFR calc non Af Amer: 47 mL/min/{1.73_m2} — ABNORMAL LOW (ref >59)
Glucose: 108 mg/dL — ABNORMAL HIGH (ref 65–99)
Potassium: 4.1 mmol/L (ref 3.5–5.2)
Sodium: 142 mmol/L (ref 134–144)

## 2016-08-04 LAB — HEPATIC FUNCTION PANEL
ALT: 16 IU/L (ref 0–44)
AST: 21 IU/L (ref 0–40)
Albumin: 4 g/dL (ref 3.2–4.6)
Alkaline Phosphatase: 193 IU/L — ABNORMAL HIGH (ref 39–117)
Bilirubin Total: 0.4 mg/dL (ref 0.0–1.2)
Bilirubin, Direct: 0.15 mg/dL (ref 0.00–0.40)
Total Protein: 6.9 g/dL (ref 6.0–8.5)

## 2016-08-04 LAB — LIPID PANEL
Chol/HDL Ratio: 4.2 ratio (ref 0.0–5.0)
Cholesterol, Total: 171 mg/dL (ref 100–199)
HDL: 41 mg/dL (ref >39)
LDL Calculated: 100 mg/dL — ABNORMAL HIGH (ref 0–99)
Triglycerides: 151 mg/dL — ABNORMAL HIGH (ref 0–149)
VLDL Cholesterol Cal: 30 mg/dL (ref 5–40)

## 2016-08-04 LAB — VITAMIN D 25 HYDROXY (VIT D DEFICIENCY, FRACTURES): Vit D, 25-Hydroxy: 21.6 ng/mL — ABNORMAL LOW (ref 30.0–100.0)

## 2016-08-04 NOTE — Addendum Note (Signed)
Addended by: Shelbie Ammons on: 08/04/2016 09:38 AM   Modules accepted: Orders

## 2016-08-17 ENCOUNTER — Other Ambulatory Visit: Payer: Self-pay | Admitting: Family Medicine

## 2016-08-27 ENCOUNTER — Encounter: Payer: Self-pay | Admitting: Pharmacist

## 2016-08-30 ENCOUNTER — Other Ambulatory Visit: Payer: Self-pay | Admitting: Family Medicine

## 2016-08-30 ENCOUNTER — Encounter: Payer: Self-pay | Admitting: Family Medicine

## 2016-08-31 ENCOUNTER — Ambulatory Visit (INDEPENDENT_AMBULATORY_CARE_PROVIDER_SITE_OTHER): Payer: Medicare Other | Admitting: Pharmacist

## 2016-08-31 ENCOUNTER — Other Ambulatory Visit: Payer: Self-pay | Admitting: Family Medicine

## 2016-08-31 DIAGNOSIS — I4891 Unspecified atrial fibrillation: Secondary | ICD-10-CM | POA: Diagnosis not present

## 2016-08-31 DIAGNOSIS — Z86711 Personal history of pulmonary embolism: Secondary | ICD-10-CM

## 2016-08-31 DIAGNOSIS — R945 Abnormal results of liver function studies: Secondary | ICD-10-CM

## 2016-08-31 DIAGNOSIS — R7989 Other specified abnormal findings of blood chemistry: Secondary | ICD-10-CM

## 2016-08-31 LAB — COAGUCHEK XS/INR WAIVED
INR: 2.3 — ABNORMAL HIGH (ref 0.9–1.1)
Prothrombin Time: 27.8 s

## 2016-09-01 LAB — HEPATIC FUNCTION PANEL
ALT: 12 IU/L (ref 0–44)
AST: 18 IU/L (ref 0–40)
Albumin: 4.1 g/dL (ref 3.2–4.6)
Alkaline Phosphatase: 194 IU/L — ABNORMAL HIGH (ref 39–117)
Bilirubin Total: 0.3 mg/dL (ref 0.0–1.2)
Bilirubin, Direct: 0.08 mg/dL (ref 0.00–0.40)
Total Protein: 6.8 g/dL (ref 6.0–8.5)

## 2016-09-13 NOTE — Addendum Note (Signed)
Addendum  created 09/13/16 1121 by Myrtie Soman, MD   Sign clinical note

## 2016-09-24 ENCOUNTER — Ambulatory Visit: Payer: Medicare Other | Admitting: Urology

## 2016-10-05 ENCOUNTER — Encounter: Payer: Self-pay | Admitting: Pharmacist

## 2016-10-05 ENCOUNTER — Other Ambulatory Visit: Payer: Self-pay | Admitting: Family Medicine

## 2016-10-06 ENCOUNTER — Encounter: Payer: Self-pay | Admitting: Family Medicine

## 2016-10-11 ENCOUNTER — Ambulatory Visit (INDEPENDENT_AMBULATORY_CARE_PROVIDER_SITE_OTHER): Payer: Medicare Other | Admitting: Pharmacist

## 2016-10-11 DIAGNOSIS — Z86711 Personal history of pulmonary embolism: Secondary | ICD-10-CM | POA: Diagnosis not present

## 2016-10-11 DIAGNOSIS — I4891 Unspecified atrial fibrillation: Secondary | ICD-10-CM

## 2016-10-11 LAB — COAGUCHEK XS/INR WAIVED
INR: 3.1 — ABNORMAL HIGH (ref 0.9–1.1)
Prothrombin Time: 36.8 s

## 2016-10-25 ENCOUNTER — Other Ambulatory Visit: Payer: Self-pay | Admitting: Family Medicine

## 2016-11-01 ENCOUNTER — Ambulatory Visit (INDEPENDENT_AMBULATORY_CARE_PROVIDER_SITE_OTHER): Payer: Medicare Other | Admitting: Pharmacist Clinician (PhC)/ Clinical Pharmacy Specialist

## 2016-11-01 DIAGNOSIS — Z86711 Personal history of pulmonary embolism: Secondary | ICD-10-CM | POA: Diagnosis not present

## 2016-11-01 DIAGNOSIS — I4891 Unspecified atrial fibrillation: Secondary | ICD-10-CM | POA: Diagnosis not present

## 2016-11-01 NOTE — Patient Instructions (Signed)
Anticoagulation Warfarin Dose Instructions as of 11/01/2016      Scott [name] Tue Wed Thu Fri Sat   New Dose 4.5 mg 4.5 mg 4.5 mg 4.5 mg 4.5 mg 4.5 mg 4.5 mg    Description    Continue current warfarin dose of 2.5mg  + 2mg  (to equal 4.5mg ) daily.  INR was 1.9  today

## 2016-11-03 LAB — COAGUCHEK XS/INR WAIVED
INR: 1.9 — ABNORMAL HIGH (ref 0.9–1.1)
Prothrombin Time: 23 s

## 2016-11-19 ENCOUNTER — Ambulatory Visit (INDEPENDENT_AMBULATORY_CARE_PROVIDER_SITE_OTHER): Payer: Medicare Other | Admitting: Urology

## 2016-11-19 DIAGNOSIS — C670 Malignant neoplasm of trigone of bladder: Secondary | ICD-10-CM

## 2016-11-23 ENCOUNTER — Other Ambulatory Visit: Payer: Self-pay | Admitting: Family Medicine

## 2016-11-25 ENCOUNTER — Ambulatory Visit (INDEPENDENT_AMBULATORY_CARE_PROVIDER_SITE_OTHER): Payer: Medicare Other | Admitting: Family

## 2016-11-25 ENCOUNTER — Ambulatory Visit (INDEPENDENT_AMBULATORY_CARE_PROVIDER_SITE_OTHER): Payer: Medicare Other | Admitting: Pharmacist

## 2016-11-25 ENCOUNTER — Encounter: Payer: Self-pay | Admitting: Family

## 2016-11-25 ENCOUNTER — Telehealth: Payer: Self-pay | Admitting: Family Medicine

## 2016-11-25 ENCOUNTER — Ambulatory Visit (INDEPENDENT_AMBULATORY_CARE_PROVIDER_SITE_OTHER): Payer: Medicare Other

## 2016-11-25 VITALS — BP 154/76 | HR 58 | Temp 97.8°F

## 2016-11-25 DIAGNOSIS — I4891 Unspecified atrial fibrillation: Secondary | ICD-10-CM

## 2016-11-25 DIAGNOSIS — R05 Cough: Secondary | ICD-10-CM

## 2016-11-25 DIAGNOSIS — R059 Cough, unspecified: Secondary | ICD-10-CM

## 2016-11-25 DIAGNOSIS — R062 Wheezing: Secondary | ICD-10-CM

## 2016-11-25 DIAGNOSIS — J301 Allergic rhinitis due to pollen: Secondary | ICD-10-CM

## 2016-11-25 DIAGNOSIS — Z86711 Personal history of pulmonary embolism: Secondary | ICD-10-CM

## 2016-11-25 LAB — COAGUCHEK XS/INR WAIVED
INR: 3.8 — ABNORMAL HIGH (ref 0.9–1.1)
Prothrombin Time: 45.7 s

## 2016-11-25 MED ORDER — FLUTICASONE PROPIONATE 50 MCG/ACT NA SUSP: 1.0000 | Freq: Every day | NASAL | 2 refills | Status: DC | PRN

## 2016-11-25 MED ORDER — PREDNISONE 10 MG (21) PO TBPK: ORAL_TABLET | ORAL | 0 refills | Status: DC

## 2016-11-25 NOTE — Addendum Note (Signed)
Addended by: Earlene Plater on: 11/25/2016 12:32 PM   Modules accepted: Orders

## 2016-11-25 NOTE — Patient Instructions (Signed)
Bronchospasm, Adult Bronchospasm is a tightening of the airways going into the lungs. During an episode, it may be harder to breathe. You may cough, and you may make a whistling sound when you breathe (wheeze). This condition often affects people with asthma. What are the causes? This condition is caused by swelling and irritation in the airways. It can be triggered by:  An infection (common).  Seasonal allergies.  An allergic reaction.  Exercise.  Irritants. These include pollution, cigarette smoke, strong odors, aerosol sprays, and paint fumes.  Weather changes. Winds increase molds and pollens in the air. Cold air may cause swelling.  Stress and emotional upset.  What are the signs or symptoms? Symptoms of this condition include:  Wheezing. If the episode was triggered by an allergy, wheezing may start right away or hours later.  Nighttime coughing.  Frequent or severe coughing with a simple cold.  Chest tightness.  Shortness of breath.  Decreased ability to exercise.  How is this diagnosed? This condition is usually diagnosed with a review of your medical history and a physical exam. Tests, such as lung function tests, are sometimes done to look for other conditions. The need for a chest X-ray depends on where the wheezing occurs and whether it is the first time you have wheezed. How is this treated? This condition may be treated with:  Inhaled medicines. These open up the airways and help you breathe. They can be taken with an inhaler or a nebulizer device.  Corticosteroid medicines. These may be given for severe bronchospasm, usually when it is associated with asthma.  Avoiding triggers, such as irritants, infection, or allergies.  Follow these instructions at home: Medicines  Take over-the-counter and prescription medicines only as told by your health care provider.  If you need to use an inhaler or nebulizer to take your medicine, ask your health care  provider to explain how to use it correctly. If you were given a spacer, always use it with your inhaler. Lifestyle  Reduce the number of triggers in your home. To do this: ? Change your heating and air conditioning filter at least once a month. ? Limit your use of fireplaces and wood stoves. ? Do not smoke. Do not allow smoking in your home. ? Avoid using perfumes and fragrances. ? Get rid of pests, such as roaches and mice, and their droppings. ? Remove any mold from your home. ? Keep your house clean and dust free. Use unscented cleaning products. ? Replace carpet with wood, tile, or vinyl flooring. Carpet can trap dander and dust. ? Use allergy-proof pillows, mattress covers, and box spring covers. ? Wash bed sheets and blankets every week in hot water. Dry them in a dryer. ? Use blankets that are made of polyester or cotton. ? Wash your hands often. ? Do not allow pets in your bedroom.  Avoid breathing in cold air when you exercise. General instructions  Have a plan for seeking medical care. Know when to call your health care provider and local emergency services, and where to get emergency care.  Stay up to date on your immunizations.  When you have an episode of bronchospasm, stay calm. Try to relax and breathe more slowly.  If you have asthma, make sure you have an asthma action plan.  Keep all follow-up visits as told by your health care provider. This is important. Contact a health care provider if:  You have muscle aches.  You have chest pain.  The mucus that you   cough up (sputum) changes from clear or white to yellow, green, gray, or bloody.  You have a fever.  Your sputum gets thicker. Get help right away if:  Your wheezing and coughing get worse, even after you take your prescribed medicines.  It gets even harder to breathe.  You develop severe chest pain. Summary  Bronchospasm is a tightening of the airways going into the lungs.  During an episode of  bronchospasm, you may have a harder time breathing. You may cough and make a whistling sound when you breathe (wheeze).  Avoid exposure to triggers such as smoke, dust, mold, animal dander, and fragrances.  When you have an episode of bronchospasm, stay calm. Try to relax and breathe more slowly. This information is not intended to replace advice given to you by your health care provider. Make sure you discuss any questions you have with your health care provider. Document Released: 04/01/2003 Document Revised: 03/25/2016 Document Reviewed: 03/25/2016 Elsevier Interactive Patient Education  2017 Elsevier Inc.  

## 2016-11-25 NOTE — Progress Notes (Signed)
   Subjective:    Patient ID: Scott [name], male    DOB: 20-Nov-1921, 83 y.o.   MRN: [id number]  Wheezing   Associated symptoms include coughing, rhinorrhea and shortness of breath. Pertinent negatives include no chills, ear pain, fever, headaches or sore throat.  Cough  The current episode started more than 1 month ago. The problem has been gradually worsening. The problem occurs every few minutes. The cough is non-productive. Associated symptoms include rhinorrhea, shortness of breath and wheezing. Pertinent negatives include no chills, ear congestion, ear pain, fever, headaches, myalgias, nasal congestion, postnasal drip or sore throat.      Review of Systems  Constitutional: Negative for chills and fever.  HENT: Positive for rhinorrhea. Negative for ear pain, postnasal drip and sore throat.   Respiratory: Positive for cough, shortness of breath and wheezing.   Musculoskeletal: Negative for myalgias.  Neurological: Negative for headaches.  All other systems reviewed and are negative.      Objective:   Physical Exam  Constitutional: He is oriented to person, place, and time. He appears well-developed and well-nourished. No distress.  HENT:  Head: Normocephalic.  Right Ear: External ear normal.  Left Ear: External ear normal.  Nose: Nose normal.  Mouth/Throat: Oropharynx is clear and moist.  Eyes: Pupils are equal, round, and reactive to light. Right eye exhibits no discharge. Left eye exhibits no discharge.  Neck: Normal range of motion. Neck supple. No thyromegaly present.  Cardiovascular: Normal rate, regular rhythm, normal heart sounds and intact distal pulses.   No murmur heard. Pulmonary/Chest: Effort normal. No respiratory distress. He has decreased breath sounds in the left upper field and the left middle field. He has no wheezes.  Upper airway wheezing  Abdominal: Soft. Bowel sounds are normal. He exhibits no distension. There is no tenderness.  Musculoskeletal: Normal  range of motion. He exhibits no edema or tenderness.  Neurological: He is alert and oriented to person, place, and time.  Skin: Skin is warm and dry. No rash noted. No erythema.  Psychiatric: He has a normal mood and affect. His behavior is normal. Judgment and thought content normal.  Vitals reviewed.    BP (!) 154/76   Pulse (!) 58   Temp 97.8 F (36.6 C) (Oral)   SpO2 95% Comment: Sitting with room air.     Assessment & Plan:  1. Wheezing - DG Chest 2 View; Future - predniSONE (STERAPRED UNI-PAK 21 TAB) 10 MG (21) TBPK tablet; Use as directed  Dispense: 21 tablet; Refill: 0  2. Cough - DG Chest 2 View; Future - fluticasone (FLONASE) 50 MCG/ACT nasal spray; Place 1 spray into both nostrils daily as needed for allergies or rhinitis.  Dispense: 16 g; Refill: 2 - predniSONE (STERAPRED UNI-PAK 21 TAB) 10 MG (21) TBPK tablet; Use as directed  Dispense: 21 tablet; Refill: 0  3. Allergic rhinitis due to pollen, unspecified seasonality  Will do chest x-ray to rule out pneumonia  Flonase daily Will adjust INR accordingly  RTO prn   Evelina Dun, FNP

## 2016-11-25 NOTE — Telephone Encounter (Signed)
Patient's nurse notified of warfarin changes due to prednisone therapy.  RTC on check INR in 12/08/16

## 2016-11-26 ENCOUNTER — Encounter: Payer: Self-pay | Admitting: Family Medicine

## 2016-11-26 DIAGNOSIS — I700 Atherosclerosis of aorta: Secondary | ICD-10-CM | POA: Insufficient documentation

## 2016-11-29 ENCOUNTER — Other Ambulatory Visit: Payer: Self-pay | Admitting: Urology

## 2016-11-29 NOTE — Progress Notes (Signed)
Consulted with Dr. Tamela Gammon, Anesthesia about patient's EKG from 12/2015, last Echo results and medical history and patient is OK for surgery.

## 2016-12-02 NOTE — Patient Instructions (Addendum)
Scott [name]  12/02/2016   Your procedure is scheduled on: 12-09-16  Report to [facility] to 3rd floor to  [facility] at  08:45 AM.   Call this number if you have problems the morning of surgery [phone]    Remember: ONLY 1 PERSON MAY GO WITH YOU TO SHORT STAY TO GET  READY MORNING OF [facility] [place].  Do not eat food or drink liquids :After Midnight.     Take these medicines the morning of surgery with A SIP OF WATER: None- Per pt's preference                               You may not have any metal on your body including hair pins and              piercings  Do not wear jewelry, lotions, powders or perfumes, deodorant             Men may shave face and neck.   Do not bring valuables to the hospital. [facility].  Contacts, dentures or bridgework may not be worn into surgery.     Patients discharged the day of surgery will not be allowed to drive home.  Name and phone number of your driver: Ahmon Tosi [phone]               Please read over the following fact sheets you were given: _____________________________________________________________________             [facility] - Preparing for Surgery Before surgery, you can play an important role.  Because skin is not sterile, your skin needs to be as free of germs as possible.  You can reduce the number of germs on your skin by washing with CHG (chlorahexidine gluconate) soap before surgery.  CHG is an antiseptic cleaner which kills germs and bonds with the skin to continue killing germs even after washing. Please DO NOT use if you have an allergy to CHG or antibacterial soaps.  If your skin becomes reddened/irritated stop using the CHG and inform your nurse when you arrive at Short Stay. Do not shave (including legs and underarms) for at least 48 hours prior to the first CHG shower.  You may shave your  face/neck. Please follow these instructions carefully:  1.  Shower with CHG Soap the night before surgery and the  morning of Surgery.  2.  If you choose to wash your hair, wash your hair first as usual with your  normal  shampoo.  3.  After you shampoo, rinse your hair and body thoroughly to remove the  shampoo.                           4.  Use CHG as you would any other liquid soap.  You can apply chg directly  to the skin and wash                       Gently with a scrungie or clean washcloth.  5.  Apply the CHG Soap to your body ONLY FROM THE NECK DOWN.   Do not use on  face/ open                           Wound or open sores. Avoid contact with eyes, ears mouth and genitals (private parts).                       Wash face,  Genitals (private parts) with your normal soap.             6.  Wash thoroughly, paying special attention to the area where your surgery  will be performed.  7.  Thoroughly rinse your body with warm water from the neck down.  8.  DO NOT shower/wash with your normal soap after using and rinsing off  the CHG Soap.                9.  Pat yourself dry with a clean towel.            10.  Wear clean pajamas.            11.  Place clean sheets on your bed the night of your first shower and do not  sleep with pets. Day of Surgery : Do not apply any lotions/deodorants the morning of surgery.  Please wear clean clothes to the hospital/surgery center.  FAILURE TO FOLLOW THESE INSTRUCTIONS MAY RESULT IN THE CANCELLATION OF YOUR SURGERY PATIENT SIGNATURE_________________________________  NURSE SIGNATURE__________________________________  ________________________________________________________________________

## 2016-12-03 ENCOUNTER — Encounter (HOSPITAL_COMMUNITY): Payer: Self-pay

## 2016-12-03 ENCOUNTER — Other Ambulatory Visit (HOSPITAL_COMMUNITY): Payer: Medicare Other

## 2016-12-03 ENCOUNTER — Encounter (HOSPITAL_COMMUNITY)
Admission: RE | Admit: 2016-12-03 | Discharge: 2016-12-03 | Disposition: A | Discharge status: Home / Self Care | Payer: Medicare Other | Source: Ambulatory Visit | Attending: Urology | Admitting: Urology

## 2016-12-03 DIAGNOSIS — D4959 Neoplasm of unspecified behavior of other genitourinary organ: Secondary | ICD-10-CM | POA: Insufficient documentation

## 2016-12-03 DIAGNOSIS — Z01812 Encounter for preprocedural laboratory examination: Secondary | ICD-10-CM | POA: Diagnosis not present

## 2016-12-03 LAB — BASIC METABOLIC PANEL
Anion gap: 4 — ABNORMAL LOW (ref 5–15)
BUN: 24 mg/dL — ABNORMAL HIGH (ref 6–20)
CO2: 34 mmol/L — ABNORMAL HIGH (ref 22–32)
Calcium: 9.2 mg/dL (ref 8.9–10.3)
Chloride: 103 mmol/L (ref 101–111)
Creatinine, Ser: 1.24 mg/dL (ref 0.61–1.24)
GFR calc Af Amer: 56 mL/min — ABNORMAL LOW (ref >60)
GFR calc non Af Amer: 48 mL/min — ABNORMAL LOW (ref >60)
Glucose, Bld: 99 mg/dL (ref 65–99)
Potassium: 4.7 mmol/L (ref 3.5–5.1)
Sodium: 141 mmol/L (ref 135–145)

## 2016-12-03 LAB — CBC
HCT: 43.5 % (ref 39.0–52.0)
Hemoglobin: 14.2 g/dL (ref 13.0–17.0)
MCH: 28.1 pg (ref 26.0–34.0)
MCHC: 32.6 g/dL (ref 30.0–36.0)
MCV: 86.1 fL (ref 78.0–100.0)
Platelets: 160 10*3/uL (ref 150–400)
RBC: 5.05 MIL/uL (ref 4.22–5.81)
RDW: 15.4 % (ref 11.5–15.5)
WBC: 6.6 10*3/uL (ref 4.0–10.5)

## 2016-12-03 LAB — SURGICAL PCR SCREEN
MRSA, PCR: NEGATIVE
Staphylococcus aureus: NEGATIVE

## 2016-12-03 NOTE — Progress Notes (Signed)
12-16-15 (EKG) SR w/1 degree AV Block, LOV with Dr. Laurance Flatten Cardiologist  11-29-15 Urethral Biopsy and Fulguration procedure cancelled due to patient becoming bradycardic w/HR of 20-30. Received 2 doses of atropine and HR improved to 60's.    10-04-14 (EKG) ECHO, 50-55%, No stenosis, trivial regurgitation  Hx of PVC's,  CHF, ETOH  Discussed the above with Dr. Ileene Patrick, Anesthesiologist to determine if cardiac clearance was needed. Per Dr. Ileene Patrick, no cardiac clearance needed at this time.

## 2016-12-08 ENCOUNTER — Ambulatory Visit (INDEPENDENT_AMBULATORY_CARE_PROVIDER_SITE_OTHER): Payer: Medicare Other | Admitting: *Deleted

## 2016-12-08 DIAGNOSIS — Z86711 Personal history of pulmonary embolism: Secondary | ICD-10-CM

## 2016-12-08 DIAGNOSIS — I4891 Unspecified atrial fibrillation: Secondary | ICD-10-CM

## 2016-12-08 LAB — COAGUCHEK XS/INR WAIVED
INR: 1.3 — ABNORMAL HIGH (ref 0.9–1.1)
Prothrombin Time: 16.2 s

## 2016-12-08 NOTE — Patient Instructions (Signed)
Anticoagulation Warfarin Dose Instructions as of 12/08/2016      Dorene Grebe Tue Wed Thu Fri Sat   New Dose 4.5 mg 4.5 mg 4.5 mg 4.5 mg 4.5 mg 4.5 mg 4.5 mg    Description     The restart usual warfarin dose of 2.5mg  + 2mg  (to equal 4.5mg ) daily on 12/10/16 as instructed  INR was 1.3 today.  Return on 12/17/16 at 12:20

## 2016-12-08 NOTE — Progress Notes (Signed)
Subjective:     Indication: atrial fibrillation and PE Bleeding signs/symptoms: None Thromboembolic signs/symptoms: None  Missed Coumadin doses: This week - 5 days. Being held for procedure tomorrow Medication changes: no Dietary changes: no Bacterial/viral infection: no Other concerns: no  Objective:    INR Today: 1.3 Current dose: 4.5 daily    Assessment:    Subtherapeutic INR for goal of 2-3   Plan:    1. New dose: resume normal dose on 12/10/16 as instructed after procedure   2. Next INR: 2 weeks    Discussed with Particia Nearing, PA-C.  Chong Sicilian, RN

## 2016-12-09 ENCOUNTER — Encounter (HOSPITAL_COMMUNITY)
Admission: RE | Disposition: A | Discharge status: Home / Self Care | Payer: Self-pay | Source: Ambulatory Visit | Attending: Urology

## 2016-12-09 ENCOUNTER — Ambulatory Visit (HOSPITAL_COMMUNITY): Payer: Medicare Other | Admitting: Certified Registered Nurse Anesthetist

## 2016-12-09 ENCOUNTER — Encounter (HOSPITAL_COMMUNITY): Payer: Self-pay | Admitting: *Deleted

## 2016-12-09 ENCOUNTER — Ambulatory Visit (HOSPITAL_COMMUNITY)
Admission: RE | Admit: 2016-12-09 | Discharge: 2016-12-09 | Disposition: A | Discharge status: Home / Self Care | Payer: Medicare Other | Source: Ambulatory Visit | Attending: Urology | Admitting: Urology

## 2016-12-09 DIAGNOSIS — N329 Bladder disorder, unspecified: Secondary | ICD-10-CM | POA: Diagnosis present

## 2016-12-09 DIAGNOSIS — Z87891 Personal history of nicotine dependence: Secondary | ICD-10-CM | POA: Diagnosis not present

## 2016-12-09 DIAGNOSIS — N3289 Other specified disorders of bladder: Secondary | ICD-10-CM | POA: Insufficient documentation

## 2016-12-09 DIAGNOSIS — E7800 Pure hypercholesterolemia, unspecified: Secondary | ICD-10-CM | POA: Diagnosis not present

## 2016-12-09 DIAGNOSIS — Z888 Allergy status to other drugs, medicaments and biological substances status: Secondary | ICD-10-CM | POA: Insufficient documentation

## 2016-12-09 DIAGNOSIS — D413 Neoplasm of uncertain behavior of urethra: Secondary | ICD-10-CM | POA: Diagnosis not present

## 2016-12-09 DIAGNOSIS — N400 Enlarged prostate without lower urinary tract symptoms: Secondary | ICD-10-CM | POA: Insufficient documentation

## 2016-12-09 DIAGNOSIS — I739 Peripheral vascular disease, unspecified: Secondary | ICD-10-CM | POA: Insufficient documentation

## 2016-12-09 DIAGNOSIS — C670 Malignant neoplasm of trigone of bladder: Secondary | ICD-10-CM | POA: Insufficient documentation

## 2016-12-09 DIAGNOSIS — I2510 Atherosclerotic heart disease of native coronary artery without angina pectoris: Secondary | ICD-10-CM | POA: Diagnosis not present

## 2016-12-09 DIAGNOSIS — Z86718 Personal history of other venous thrombosis and embolism: Secondary | ICD-10-CM | POA: Insufficient documentation

## 2016-12-09 DIAGNOSIS — M109 Gout, unspecified: Secondary | ICD-10-CM | POA: Diagnosis not present

## 2016-12-09 DIAGNOSIS — I5022 Chronic systolic (congestive) heart failure: Secondary | ICD-10-CM | POA: Diagnosis not present

## 2016-12-09 DIAGNOSIS — M1990 Unspecified osteoarthritis, unspecified site: Secondary | ICD-10-CM | POA: Diagnosis not present

## 2016-12-09 DIAGNOSIS — I252 Old myocardial infarction: Secondary | ICD-10-CM | POA: Insufficient documentation

## 2016-12-09 DIAGNOSIS — Z7901 Long term (current) use of anticoagulants: Secondary | ICD-10-CM | POA: Diagnosis not present

## 2016-12-09 DIAGNOSIS — I110 Hypertensive heart disease with heart failure: Secondary | ICD-10-CM | POA: Insufficient documentation

## 2016-12-09 DIAGNOSIS — D4959 Neoplasm of unspecified behavior of other genitourinary organ: Secondary | ICD-10-CM | POA: Diagnosis not present

## 2016-12-09 DIAGNOSIS — C680 Malignant neoplasm of urethra: Secondary | ICD-10-CM | POA: Diagnosis not present

## 2016-12-09 DIAGNOSIS — D494 Neoplasm of unspecified behavior of bladder: Secondary | ICD-10-CM | POA: Diagnosis not present

## 2016-12-09 DIAGNOSIS — I459 Conduction disorder, unspecified: Secondary | ICD-10-CM | POA: Diagnosis not present

## 2016-12-09 DIAGNOSIS — I4891 Unspecified atrial fibrillation: Secondary | ICD-10-CM | POA: Diagnosis not present

## 2016-12-09 DIAGNOSIS — Z79899 Other long term (current) drug therapy: Secondary | ICD-10-CM | POA: Insufficient documentation

## 2016-12-09 DIAGNOSIS — C679 Malignant neoplasm of bladder, unspecified: Secondary | ICD-10-CM | POA: Diagnosis not present

## 2016-12-09 DIAGNOSIS — I96 Gangrene, not elsewhere classified: Secondary | ICD-10-CM | POA: Diagnosis not present

## 2016-12-09 HISTORY — PX: CYSTOSCOPY WITH BIOPSY: SHX5122

## 2016-12-09 SURGERY — CYSTOSCOPY, WITH BIOPSY
Anesthesia: Monitor Anesthesia Care | Site: Urethra

## 2016-12-09 MED ORDER — ONDANSETRON HCL 4 MG/2ML IJ SOLN
INTRAMUSCULAR | Status: AC
  Filled 2016-12-09: qty 2

## 2016-12-09 MED ORDER — FENTANYL CITRATE (PF) 100 MCG/2ML IJ SOLN
INTRAMUSCULAR | Status: DC | PRN
  Administered 2016-12-09 (×2): 25 ug via INTRAVENOUS

## 2016-12-09 MED ORDER — PROPOFOL 500 MG/50ML IV EMUL
INTRAVENOUS | Status: DC | PRN
  Administered 2016-12-09: 50 ug/kg/min via INTRAVENOUS

## 2016-12-09 MED ORDER — ONDANSETRON HCL 4 MG/2ML IJ SOLN: 4.0000 mg | Freq: Once | INTRAMUSCULAR | Status: DC | PRN

## 2016-12-09 MED ORDER — ACETAMINOPHEN 325 MG PO TABS: 325.0000 mg | ORAL_TABLET | ORAL | Status: DC | PRN

## 2016-12-09 MED ORDER — PROPOFOL 500 MG/50ML IV EMUL
INTRAVENOUS | Status: DC | PRN
  Administered 2016-12-09: 30 mg via INTRAVENOUS
  Administered 2016-12-09: 20 mg via INTRAVENOUS

## 2016-12-09 MED ORDER — LACTATED RINGERS IV SOLN
INTRAVENOUS | Status: DC | PRN
Start: 2016-12-09 — End: 2016-12-09
  Administered 2016-12-09: 10:00:00 via INTRAVENOUS

## 2016-12-09 MED ORDER — ACETAMINOPHEN 160 MG/5ML PO SOLN: 325.0000 mg | ORAL | Status: DC | PRN

## 2016-12-09 MED ORDER — PROPOFOL 10 MG/ML IV BOLUS
INTRAVENOUS | Status: AC
  Filled 2016-12-09: qty 40

## 2016-12-09 MED ORDER — CEFAZOLIN SODIUM-DEXTROSE 2-4 GM/100ML-% IV SOLN
2.0000 g | INTRAVENOUS | Status: AC
  Administered 2016-12-09: 2 g via INTRAVENOUS
  Filled 2016-12-09: qty 100

## 2016-12-09 MED ORDER — OXYCODONE HCL 5 MG PO TABS: 5.0000 mg | ORAL_TABLET | Freq: Once | ORAL | Status: DC | PRN

## 2016-12-09 MED ORDER — SODIUM CHLORIDE 0.9 % IR SOLN
Status: DC | PRN
  Administered 2016-12-09: 3000 mL

## 2016-12-09 MED ORDER — ONDANSETRON HCL 4 MG/2ML IJ SOLN
INTRAMUSCULAR | Status: DC | PRN
  Administered 2016-12-09: 4 mg via INTRAVENOUS

## 2016-12-09 MED ORDER — MEPERIDINE HCL 50 MG/ML IJ SOLN: 6.2500 mg | INTRAMUSCULAR | Status: DC | PRN

## 2016-12-09 MED ORDER — FENTANYL CITRATE (PF) 100 MCG/2ML IJ SOLN
INTRAMUSCULAR | Status: AC
  Filled 2016-12-09: qty 2

## 2016-12-09 MED ORDER — LACTATED RINGERS IV SOLN: INTRAVENOUS | Status: DC

## 2016-12-09 MED ORDER — OXYCODONE HCL 5 MG/5ML PO SOLN
5.0000 mg | Freq: Once | ORAL | Status: DC | PRN
  Filled 2016-12-09: qty 5

## 2016-12-09 MED ORDER — METOPROLOL SUCCINATE ER 50 MG PO TB24
50.0000 mg | ORAL_TABLET | Freq: Every day | ORAL | Status: DC
  Filled 2016-12-09: qty 1

## 2016-12-09 SURGICAL SUPPLY — 19 items
BAG URINE DRAINAGE (UROLOGICAL SUPPLIES) ×2 IMPLANT
BAG URO CATCHER STRL LF (MISCELLANEOUS) ×2 IMPLANT
CATH FOLEY 2WAY SLVR  5CC 18FR (CATHETERS) ×1
CATH FOLEY 2WAY SLVR 5CC 18FR (CATHETERS) ×1 IMPLANT
CATH FOLEY 3WAY 30CC 22FR (CATHETERS) IMPLANT
CATH URET 5FR 28IN OPEN ENDED (CATHETERS) IMPLANT
COVER FOOTSWITCH UNIV (MISCELLANEOUS) ×2 IMPLANT
COVER SURGICAL LIGHT HANDLE (MISCELLANEOUS) ×2 IMPLANT
GLOVE SURG SS PI 8.0 STRL IVOR (GLOVE) IMPLANT
GOWN STRL REUS W/TWL XL LVL3 (GOWN DISPOSABLE) ×2 IMPLANT
HOLDER FOLEY CATH W/STRAP (MISCELLANEOUS) ×2 IMPLANT
LOOP CUT BIPOLAR 24F LRG (ELECTROSURGICAL) ×2 IMPLANT
MANIFOLD NEPTUNE II (INSTRUMENTS) ×2 IMPLANT
PACK CYSTO (CUSTOM PROCEDURE TRAY) ×2 IMPLANT
SET ASPIRATION TUBING (TUBING) ×2 IMPLANT
SUT ETHILON 3 0 PS 1 (SUTURE) IMPLANT
SYR 30ML LL (SYRINGE) IMPLANT
SYRINGE IRR TOOMEY STRL 70CC (SYRINGE) IMPLANT
TUBING CONNECTING 10 (TUBING) ×2 IMPLANT

## 2016-12-09 NOTE — Anesthesia Preprocedure Evaluation (Signed)
Anesthesia Evaluation  Patient identified by MRN, date of birth, ID band Patient awake    Reviewed: Allergy & Precautions, NPO status , Patient's Chart, lab work & pertinent test results  Airway Mallampati: II  TM Distance: >3 FB Neck ROM: Full    Dental no notable dental hx.    Pulmonary former smoker,    Pulmonary exam normal breath sounds clear to auscultation       Cardiovascular hypertension, + CAD, + Past MI, + Peripheral Vascular Disease and +CHF  Normal cardiovascular exam Rhythm:Regular Rate:Normal  Left ventricle:  Abnormal septal motion The cavity size was normal. There was mild concentric hypertrophy. Systolic function was normal. The estimated ejection fraction was in the range of 50% to 55%. Doppler parameters are consistent with abnormal left ventricular relaxation (grade 1 diastolic dysfunction).  ------------------------------------------------------------------- Aortic valve:  Sclerosis without stenosis.  Doppler:  There was trivial regurgitation.    Neuro/Psych negative neurological ROS  negative psych ROS   GI/Hepatic negative GI ROS,   Endo/Other  negative endocrine ROS  Renal/GU negative Renal ROS  negative genitourinary   Musculoskeletal   Abdominal Normal abdominal exam  (+)   Peds negative pediatric ROS (+)  Hematology negative hematology ROS (+)   Anesthesia Other Findings   Reproductive/Obstetrics negative OB ROS                             Anesthesia Physical  Anesthesia Plan  ASA: III  Anesthesia Plan: MAC   Post-op Pain Management:    Induction: Intravenous  PONV Risk Score and Plan: 1 and Ondansetron  Airway Management Planned: Simple Face Mask and Natural Airway  Additional Equipment:   Intra-op Plan:   Post-operative Plan:   Informed Consent: I have reviewed the patients History and Physical, chart, labs and discussed the procedure  including the risks, benefits and alternatives for the proposed anesthesia with the patient or authorized representative who has indicated his/her understanding and acceptance.     Plan Discussed with: CRNA and Surgeon  Anesthesia Plan Comments:         Anesthesia Quick Evaluation

## 2016-12-09 NOTE — Anesthesia Postprocedure Evaluation (Signed)
Anesthesia Post Note  Patient: Scott [name]  Procedure(s) Performed: Procedure(s) (LRB): CYSTOSCOPY WITH BIOPSY AND FULGURATION (N/A)     Patient location during evaluation: PACU Anesthesia Type: MAC Level of consciousness: awake Pain management: pain level controlled Vital Signs Assessment: post-procedure vital signs reviewed and stable Respiratory status: spontaneous breathing Cardiovascular status: stable Postop Assessment: no signs of nausea or vomiting Anesthetic complications: no    Last Vitals:  Vitals:   12/09/16 1200 12/09/16 1215  BP: (!) 165/99 (!) 178/89  Pulse: 68 (!) 56  Resp: 13 17  Temp: (!) 36.3 C   SpO2: 96% 99%    Last Pain:  Vitals:   12/09/16 0856  TempSrc: Oral   Pain Goal: Patients Stated Pain Goal: 4 (12/09/16 0910)               Guliana Weyandt JR,JOHN Mateo Flow

## 2016-12-09 NOTE — Discharge Instructions (Addendum)
General Anesthesia, Adult, Care After These instructions provide you with information about caring for yourself after your procedure. Your health care provider may also give you more specific instructions. Your treatment has been planned according to current medical practices, but problems sometimes occur. Call your health care provider if you have any problems or questions after your procedure. What can I expect after the procedure? After the procedure, it is common to have:  Vomiting.  A sore throat.  Mental slowness.  It is common to feel:  Nauseous.  Cold or shivery.  Sleepy.  Tired.  Sore or achy, even in parts of your body where you did not have surgery.  Follow these instructions at home: For at least 24 hours after the procedure:  Do not: ? Participate in activities where you could fall or become injured. ? Drive. ? Use heavy machinery. ? Drink alcohol. ? Take sleeping pills or medicines that cause drowsiness. ? Make important decisions or sign legal documents. ? Take care of children on your own.  Rest. Eating and drinking  If you vomit, drink water, juice, or soup when you can drink without vomiting.  Drink enough fluid to keep your urine clear or pale yellow.  Make sure you have little or no nausea before eating solid foods.  Follow the diet recommended by your health care provider. General instructions  Have a responsible adult stay with you until you are awake and alert.  Return to your normal activities as told by your health care provider. Ask your health care provider what activities are safe for you.  Take over-the-counter and prescription medicines only as told by your health care provider.  If you smoke, do not smoke without supervision.  Keep all follow-up visits as told by your health care provider. This is important. Contact a health care provider if:  You continue to have nausea or vomiting at home, and medicines are not helpful.  You  cannot drink fluids or start eating again.  You cannot urinate after 8-12 hours.  You develop a skin rash.  You have fever.  You have increasing redness at the site of your procedure. Get help right away if:  You have difficulty breathing.  You have chest pain.  You have unexpected bleeding.  You feel that you are having a life-threatening or urgent problem. This information is not intended to replace advice given to you by your health care provider. Make sure you discuss any questions you have with your health care provider. Document Released: 07/05/2000 Document Revised: 09/01/2015 Document Reviewed: 03/13/2015 Elsevier Interactive Patient Education  2018 [place] CARE INSTRUCTIONS  Activity: Rest for the remainder of the day.  Do not drive or operate equipment today.  You may resume normal activities in one to two days as instructed by your physician.   Meals: Drink plenty of liquids and eat light foods such as gelatin or soup this evening.  You may return to a normal meal plan tomorrow.  Return to Work: You may return to work in one to two days or as instructed by your physician.  Special Instructions / Symptoms: Call your physician if any of these symptoms occur:   -persistent or heavy bleeding  -bleeding which continues after first few urination  -large blood clots that are difficult to pass  -urine stream diminishes or stops completely  -fever equal to or higher than 101 degrees Farenheit.  -cloudy urine with a strong, foul odor  -severe pain  Females should always  wipe from front to back after elimination.  You may feel some burning pain when you urinate.  This should disappear with time.  Applying moist heat to the lower abdomen or a hot tub bath may help relieve the pain. \  You may remove the foley catheter in the morning.  The catheter has a balloon that can be drained by cutting of the nipple on the side arm prior to removal.  I you  aren't comfortable doing that, you may come to the office in [place] tomorrow to have that done.   You may resume the warfarin in 48 hours.   Patient Signature:  ________________________________________________________  Nurse's Signature:  ________________________________________________________

## 2016-12-09 NOTE — Anesthesia Procedure Notes (Signed)
Procedure Name: MAC Date/Time: 12/09/2016 11:04 AM Performed by: West Pugh Pre-anesthesia Checklist: Patient identified, Emergency Drugs available, Suction available, Patient being monitored and Timeout performed Patient Re-evaluated:Patient Re-evaluated prior to induction Oxygen Delivery Method: Simple face mask Placement Confirmation: CO2 detector,  positive ETCO2 and breath sounds checked- equal and bilateral Dental Injury: Teeth and Oropharynx as per pre-operative assessment

## 2016-12-09 NOTE — Brief Op Note (Signed)
12/09/2016  11:41 AM  PATIENT:  Scott [name]  83 y.o. 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 CYSTOSCOPY WITH FULGURATION OF #CM RIGHT TRIGONE TUMOR SURGEON:  Surgeon(s) and Role:    * [name], MD - Primary  PHYSICIAN ASSISTANT:   ASSISTANTS: none   ANESTHESIA:   MAC  EBL:  No intake/output data recorded.  BLOOD ADMINISTERED:none  DRAINS: Urinary Catheter (Foley)   LOCAL MEDICATIONS USED:  NONE  SPECIMEN:  Source of Specimen:  prostatic urethral tumor  DISPOSITION OF SPECIMEN:  PATHOLOGY  COUNTS:  YES  TOURNIQUET:  * No tourniquets in log *  DICTATION: .Other Dictation: Dictation Number [id number]  PLAN OF CARE: Discharge to home after PACU  PATIENT DISPOSITION:  PACU - hemodynamically stable.   Delay start of Pharmacological VTE agent (>24hrs) due to surgical blood loss or risk of bleeding: yes

## 2016-12-09 NOTE — H&P (Signed)
CC: I have bladder cancer.  HPI: Scott [name] is a 83 year-old male established patient who is here for bladder cancer.    Scott [name] returns today in f/u for his history of recurrent UCa. He had a prostatic and urethral NMIBC fulgurated on 06/01/16. He has some frequency and urgency but no hematuria. He has a-good stream. He remains on ditropan.     ALLERGIES: No Allergies    MEDICATIONS: Myrbetriq 25 mg tablet, extended release 24 hr 1 tablet PO Daily  Amlodipine Besilate  Benicar  Flomax  Klor-Con  Niaspan  Toprol Xl  Tricon  Zocor     GU PSH: Bladder Instill AntiCA Agent - 07/17/2015 Cysto Bladder Ureth Biopsy - 2013, 2013 Cystoscopy - 04/23/2016, 11/14/2015 Cystoscopy Fulguration - 06/01/2016, 01/20/2016 Cystoscopy TURBT <2 cm - 06/01/2016 Cystoscopy TURBT 2-5 cm - 07/17/2015, 2013 Cystoscopy TURP - 2013      PSH Notes: Bladder Injection Of Cancer Treatment, Cystoscopy With Fulguration Medium Lesion (2-5cm), Amputation Of Leg Above Knee, Transurethral Resection Of Prostate (TURP), Cystoscopy With Biopsy, Cystoscopy With Biopsy, Cystoscopy With Fulguration Medium Lesion (2-5cm), No Surgical Problems   NON-GU PSH: Amputate Leg At Thigh - 06/06/2015    GU PMH: Bladder Cancer, Unspec, He had a recurrence in his prostatic urethra at the bladder. - 06/11/2016 Urethral Cancer, He has a small papillary urethral cancer. - 11/14/2015 Bladder Cancer Trigone , Malignant neoplasm of trigone of bladder - 08/01/2015 Urinary Urgency, Urinary urgency - 08/01/2015 Gross hematuria, Gross hematuria - 06/06/2015 History of bladder cancer, History of bladder cancer - 2015 BPH w/LUTS, Benign prostatic hyperplasia with urinary obstruction - 2014      PMH Notes:  2012-08-11 14:20:22 - Note: Heart Block  2011-09-20 12:53:15 - Note: Arthritis  He was treated for pneumonia in mid July 2017.    NON-GU PMH: Personal history of other diseases of the circulatory system, History of congestive heart disease -  06/06/2015, History of hypertension, - 2014 Acute embolism and thrombosis of unspecified deep veins of unspecified distal lower extremity, Deep vein thrombosis of distal lower extremity - 5400 Chronic systolic (congestive) heart failure, Chronic systolic congestive heart failure - 2014 DVT, History, H/O blood clots - 2014 Gout, Gout - 2014 Personal history of other diseases of the nervous system and sense organs, History of glaucoma - 2014 Personal history of other endocrine, nutritional and metabolic disease, History of hypercholesterolemia - 2014 Encounter for general adult medical examination without abnormal findings, Encounter for preventive health examination    FAMILY HISTORY: Death In The Family Father - Runs In Family Death In The Family Mother - Runs In Family Family Health Status Number - Runs In Family   SOCIAL HISTORY: None    Notes: Former smoker, Marital History - Currently Married, Caffeine Use, Alcohol Use   REVIEW OF SYSTEMS:    GU Review Male:   Patient denies frequent urination, hard to postpone urination, burning/ pain with urination, get up at night to urinate, leakage of urine, stream starts and stops, trouble starting your stream, have to strain to urinate , erection problems, and penile pain.  Gastrointestinal (Upper):   Patient denies nausea, vomiting, and indigestion/ heartburn.  Gastrointestinal (Lower):   Patient denies diarrhea and constipation.  Constitutional:   Patient denies fever, night sweats, weight loss, and fatigue.  Skin:   Patient denies skin rash/ lesion and itching.  Eyes:   Patient denies blurred vision and double vision.  Ears/ Nose/ Throat:   Patient denies sore throat and sinus problems.  Hematologic/Lymphatic:   Patient denies swollen glands and easy bruising.  Cardiovascular:   Patient denies leg swelling and chest pains.  Respiratory:   Patient denies cough and shortness of breath.  Endocrine:   Patient denies excessive thirst.   Musculoskeletal:   Patient denies back pain and joint pain.  Neurological:   Patient denies headaches and dizziness.  Psychologic:   Patient denies depression and anxiety.   VITAL SIGNS:      11/19/2016 10:48 AM  Weight 160 lb / 72.57 kg  Height 67 in / 170.18 cm  BP 176/76 mmHg  Pulse 67 /min  Temperature 98.9 F / 37.1 C  BMI 25.1 kg/m   MULTI-SYSTEM PHYSICAL EXAMINATION:    Constitutional: Well-nourished. No physical deformities. Normally developed. Good grooming.  Respiratory: No labored breathing, no use of accessory muscles. bibasilar crackles.   Cardiovascular: Normal temperature, RRR without murmur     PAST DATA REVIEWED:  Source Of History:  Patient  Urine Test Review:   Urinalysis   PROCEDURES:         Flexible Cystoscopy - 52000  Risks, benefits, and some of the potential complications of the procedure were discussed. Cipro 500mg  given for antibiotic prophylaxis.     Meatus:  Normal size. Normal location. Normal condition.  Urethra:  No strictures.  External Sphincter:  Normal.  Verumontanum:  Normal.  Prostate:  Non-obstructing. No hyperplasia. There is a 1cm papillary tumor on the right lateral lobe proximally.   Bladder Neck:  Non-obstructing.  Ureteral Orifices:  Normal location. Normal size. Normal shape. Effluxed clear urine.  Bladder:  No trabeculation. No tumors.. No stones. The mucosa is slightly irregular on the right trigone.       The procedure was well tolerated and there were no complications.         Urinalysis - 81003 Dipstick Dipstick Cont'd  Specimen: Voided Bilirubin: Neg  Color: Yellow Ketones: Neg  Appearance: Clear Blood: Trace Intact  Specific Gravity: <= 1.005 Protein: Trace  pH: 6.0 Urobilinogen: 0.2  Glucose: Neg Nitrites: Neg    Leukocyte Esterase: Trace    ASSESSMENT:      ICD-10 Details  1 GU:   Bladder Cancer Trigone - C67.0 He has a recurrent tumor in the right prostatic urethra and mucosal changes on the right trigone.  He needs biopsy and fulguration. I have reviewed the risks and will get him cleared by cardiology.    PLAN:           Schedule Return Visit/Planned Activity: Next Available Appointment - Schedule Surgery

## 2016-12-09 NOTE — Transfer of Care (Signed)
Immediate Anesthesia Transfer of Care Note  Patient: Scott [name]  Procedure(s) Performed: Procedure(s): CYSTOSCOPY WITH BIOPSY AND FULGURATION (N/A)  Patient Location: PACU  Anesthesia Type:MAC  Level of Consciousness:  sedated, patient cooperative and responds to stimulation  Airway & Oxygen Therapy:Patient Spontanous Breathing and Patient connected to face mask oxgen  Post-op Assessment:  Report given to PACU RN and Post -op Vital signs reviewed and stable  Post vital signs:  Reviewed and stable  Last Vitals:  Vitals:   12/09/16 0941 12/09/16 0945  BP:  (!) 190/90  Pulse: (!) 57 (!) 59  Resp:    Temp:    SpO2: 28% 31%    Complications: No apparent anesthesia complications

## 2016-12-09 NOTE — Interval H&P Note (Signed)
History and Physical Interval Note:  12/09/2016 10:28 AM  Scott [name]  has presented today for surgery, with the diagnosis of PROSTATE URETHRAL AND TRIGONE TUMOR  The various methods of treatment have been discussed with the patient and family. After consideration of risks, benefits and other options for treatment, the patient has consented to  Procedure(s): CYSTOSCOPY WITH BIOPSY AND FULGURATION (N/A) as a surgical intervention .  The patient's history has been reviewed, patient examined, no change in status, stable for surgery.  I have reviewed the patient's chart and labs.  Questions were answered to the patient's satisfaction.     Alfonso Shackett J

## 2016-12-10 ENCOUNTER — Other Ambulatory Visit: Payer: Self-pay | Admitting: Family Medicine

## 2016-12-10 NOTE — Op Note (Signed)
NAME:  Scott [name], Scott [name]                      ACCOUNT NO.:  MEDICAL RECORD NO.:  [id number]  LOCATION:                                 FACILITY:  PHYSICIAN:  Marshall Cork. Jeffie Pollock, M.D.         DATE OF BIRTH:  DATE OF PROCEDURE:  12/09/2016 DATE OF DISCHARGE:                              OPERATIVE REPORT   PROCEDURES: 1. Cystoscopy with transurethral resection of 1.5-cm right prostatic     urethral tumor. 2. Fulguration of 3-cm right trigonal tumor (probable CIS).  PREOPERATIVE DIAGNOSIS:  Right prostatic urethral papillary tumor and right trigone tumor.  POSTOPERATIVE DIAGNOSIS:  Right prostatic urethral papillary tumor and right trigone tumor.  SURGEON:  Marshall Cork. Jeffie Pollock, M.D.  ANESTHESIA:  MAC.  DRAIN:  Eighteen-French Foley catheter.  SPECIMEN:  TUR chips from right prostatic urethra.  BLOOD LOSS:  Minimal.  COMPLICATIONS:  None.  INDICATIONS:  Scott [name] is a 83 year old African American male with recurrent urothelial neoplasia especially in the bladder and in the urethra and on recent surveillance cystoscopy, it was found to have a recurrence of the right prostatic urethra proximally and also some small papillary lesions in the right trigone.  FINDINGS AND PROCEDURE:  He was given Ancef.  A general anesthetic was induced.  He was placed in lithotomy position.  His perineum and genitalia were prepped with Betadine solution, he was draped in usual sterile fashion.  Cystoscopy was performed using the 23-French scope and 30-degree lens. Examination revealed the normal urethra.  The excision had some mild inflammatory changes in the posterior bulb from where he had had prior tumor fulgurated.  The external sphincter was intact.  The prostatic urethra had some prior resection, but bilateral hyperplasia with a 1.5- cm papillary tumor on the proximal right lateral lobe that appeared larger than it was on his recent cystoscopy.  Inspection of the bladder demonstrated the ureteral  orifices in the normal anatomic position, but on the right surrounding the orifice with a diameter of approximately 3 cm with some flat tumor that was suspicious for carcinoma in situ.  He had moderate trabeculation with cellules, but no other bladder wall lesions were noted.  After initial cystoscopy, I converted to resectoscope because of the size of the prostatic urethral lesion and this was resected in toto.  The area concerning for carcinoma in situ was then generously fulgurated with care being taken to spare the ureteral orifice.  At this point, once hemostasis was achieved and all chips were evacuated, an 18-French Foley catheter was inserted and the balloon was filled with 10 mL of sterile fluid, the catheter had clear return.  The specimen was collected for pathologic analysis.  The patient has taken down from lithotomy position.  His anesthetic was reversed.  He was moved to the recovery room in stable condition.  There were no complications.     Marshall Cork. Jeffie Pollock, M.D.     JJW/MEDQ  D:  12/09/2016  T:  12/10/2016  Job:  [id number]

## 2016-12-14 ENCOUNTER — Encounter: Payer: Self-pay | Admitting: Physician Assistant

## 2016-12-14 ENCOUNTER — Other Ambulatory Visit: Payer: Self-pay | Admitting: Physician Assistant

## 2016-12-14 ENCOUNTER — Ambulatory Visit (INDEPENDENT_AMBULATORY_CARE_PROVIDER_SITE_OTHER): Payer: Medicare Other | Admitting: Physician Assistant

## 2016-12-14 VITALS — BP 168/89 | HR 63 | Ht 66.5 in

## 2016-12-14 DIAGNOSIS — I480 Paroxysmal atrial fibrillation: Secondary | ICD-10-CM

## 2016-12-14 DIAGNOSIS — Z7901 Long term (current) use of anticoagulants: Secondary | ICD-10-CM | POA: Diagnosis not present

## 2016-12-14 DIAGNOSIS — I5033 Acute on chronic diastolic (congestive) heart failure: Secondary | ICD-10-CM

## 2016-12-14 DIAGNOSIS — I10 Essential (primary) hypertension: Secondary | ICD-10-CM

## 2016-12-14 NOTE — Progress Notes (Signed)
Cardiology Office Note   Date:  12/14/2016   ID:  Scott [name], DOB 1922-01-20, MRN [id number]  PCP:  Chipper Herb, MD  Cardiologist:  Dr. Percival Spanish 06/25/2015 [name], Suanne Marker, PA-C    History of Present Illness: Scott [name] is a 83 y.o. male with a history of NICM, non-obs dz by cath 2014, LBBB, PVCs, HTN, PAF on coumadin, NSVT, ETOH abuse  12/09/2016 had a TURP and was requested to see cards.  [name] presents for cardiology follow up and evaluation. He is here today with his son  He denies palpitations, skips. Was not aware of the PVC during ECG.   He does not get chest pain with activity, but is not very active. He is s/p L AKA, but has had problems with the prosthesis and does not ambulate much. Because of this, he would not be able to be weighed.  He is having some lower extremity edema, improved, but not controlled, by compression socks. He states his caregivers are not supposed to use any salt and cooking and cook fresh meat, and use only fruits and vegetables. He denies orthopnea or PND, but seems to be working a little bit harder to breathe today and has had more dyspnea on exertion recently.  It is a significant hardship to get him here, his son prefers all possible appointments be in [place].   Past Medical History:  Diagnosis Date  . Arthritis    "all over"  . Bladder cancer ([place]) 03/15/2012   pt denies this hx on 10/15/2014  . Bradycardia 11/28/2015   Severe-HR in the 20s to 30s following intubation for urological procedure.  Marland Kitchen CAD- non obstructive disease by cath 3/14   . Chronic lower back pain   . Chronic systolic CHF (congestive heart failure) ([place])    a. NICM - patent cors 06/2012, EF 40% at that time. b. 03/2013 eval: EF 20-25%.  . Chronic systolic heart failure ([place]) 10/04/14   EF improved 50-55%  . DDD (degenerative disc disease)   . Depression   . DVT (deep venous thrombosis) ([place])    "I think in both legs"  . DVT, bilateral lower limbs ([place])  03/2013   a. Bilat DVT dx 03/2013.  Marland Kitchen Elevated troponin    a. Adm 12/30-04/2013 - not felt to represent ACS; ?due to PE. b. Patent cors 06/2012.  Marland Kitchen ETOH abuse   . Frequency of urination   . Gangrene of toe ([place]) 10/03/2014  . Glaucoma   . Heart murmur   . HTN (hypertension)   . Hyperlipidemia   . Hypertension   . Nocturia   . NSVT (nonsustained ventricular tachycardia) ([place])    a. NSVT 06/2012; NSVT also seen during 03/2013 adm. b. Med rx. Not candidate for ICD given adv age.  Marland Kitchen PAD (peripheral artery disease) ([place])   . Pneumonia    "couple times"  . 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 Pulmonary embolism ([place])    a. By CT angio 04/2013.  Marland Kitchen PVCs (premature ventricular contractions)   . PVD (peripheral vascular disease) ([facility])    Right ABI .75, Left .78 (2006)  . Sepsis due to other etiology ([place]) 10/03/2014  . Sleep apnea   . Syncope    a. Felt to be postural syncope related to diuretics 06/2012.    Past Surgical History:  Procedure Laterality Date  . AMPUTATION Left 10/16/2014  Procedure: AMPUTATION ABOVE KNEE- LEFT;  Surgeon: Mal Misty, MD;  Location: [facility];  Service: Vascular;  Laterality: Left;  . CARDIAC CATHETERIZATION  05-11-2004  [facility]   MINIMAL CORANARY PLAQUE/ NORMAL LVF/ EF 55%/  NON-OBSTRUCTIVE LAD 25%  . CARDIAC CATHETERIZATION  06/2012  . CARDIOVASCULAR STRESS TEST  10-15-2010   LOW RISK NUCLEAR STUDY/ NO EVIDENCE OF ISCHEMIA/ NORMAL EF  . CATARACT EXTRACTION W/ INTRAOCULAR LENS  IMPLANT, BILATERAL Bilateral   . CYSTOSCOPY  10/07/2011   Procedure: CYSTOSCOPY;  Surgeon: Malka So, MD;  Location: [facility];  Service: Urology;  Laterality: N/A;  . CYSTOSCOPY WITH BIOPSY  03/14/2012   Procedure: CYSTOSCOPY WITH BIOPSY;  Surgeon: Malka So, MD;  Location: [facility];  Service: Urology;  Laterality: N/A;  WITH FULGURATION  . CYSTOSCOPY WITH BIOPSY N/A  12/09/2016   Procedure: CYSTOSCOPY WITH BIOPSY AND FULGURATION;  Surgeon: [name], MD;  Location: [facility];  Service: Urology;  Laterality: N/A;  . CYSTOSCOPY WITH FULGERATION N/A 01/20/2016   Procedure: CYSTOSCOPY, BIOPSY WITH FULGERATION OF URTHRAL TUMOR;  Surgeon: [name], MD;  Location: [facility];  Service: Urology;  Laterality: N/A;  . CYSTOSCOPY WITH FULGERATION N/A 06/01/2016   Procedure: CYSTOSCOPY WITH FULGERATION urethral tumors and bladder neck;  Surgeon: [name], MD;  Location: [facility];  Service: Urology;  Laterality: N/A;  . EYE SURGERY    . SHOULDER SURGERY  1970's   "don't remember which side or what kind of OR"  . TONSILLECTOMY    . TRANSTHORACIC ECHOCARDIOGRAM  09-22-2010   MODERATE LVH/ EF 82%/ GRADE I DIASTOLIC DYSFUNCTION/ AORTIC SCLEROSIS WITHOUT STENOSIS/  RV  SYSTOLIC  MILDLY REDUCED FUNCTION  . TRANSURETHRAL RESECTION OF BLADDER TUMOR  10/07/2011   Procedure: TRANSURETHRAL RESECTION OF BLADDER TUMOR (TURBT);  Surgeon: Malka So, MD;  Location: [facility];  Service: Urology;  Laterality: N/A;  . TRANSURETHRAL RESECTION OF BLADDER TUMOR N/A 07/10/2015   Procedure: TRANSURETHRAL RESECTION OF BLADDER TUMOR (TURBT), CYSTOSCOPY ;  Surgeon: [name], MD;  Location: [facility];  Service: Urology;  Laterality: N/A;  . TRANSURETHRAL RESECTION OF PROSTATE  03/14/2012   Procedure: TRANSURETHRAL RESECTION OF THE PROSTATE WITH GYRUS INSTRUMENTS;  Surgeon: Malka So, MD;  Location: [facility];  Service: Urology;  Laterality: N/A;    Medication Sig  . acetaminophen (TYLENOL) 500 MG tablet Take 500 mg by mouth every 6 (six) hours as needed for moderate pain or headache.  . donepezil (ARICEPT) 10 MG tablet TAKE ONE TABLET AT BEDTIME (Patient taking differently: TAKE ONE TABLET (10 MG) BY MOUTH ONCE DAILY IN THE MORNING)  . ferrous sulfate 325 (65 FE) MG tablet Take 1 tablet (325 mg total) by mouth daily with breakfast.  . fluticasone (FLONASE) 50 MCG/ACT nasal spray Place  1 spray into both nostrils daily as needed for allergies or rhinitis. (Patient taking differently: Place 1 spray into both nostrils at bedtime. )  . folic acid (FOLVITE) 1 MG tablet TAKE 1 TABLET DAILY  . furosemide (LASIX) 40 MG tablet TAKE 1 TABLET EVERY DAY  . gabapentin (NEURONTIN) 100 MG capsule TAKE (2) CAPSULES TWICE DAILY.  . isosorbide dinitrate (ISORDIL) 30 MG tablet TAKE 1 TABLET ONCE A DAY  . metoprolol succinate (TOPROL-XL) 50 MG 24 hr tablet TAKE 1 TABLET DAILY  . MYRBETRIQ 25 MG TB24 tablet Take 25 mg by mouth daily.   . nitroGLYCERIN (NITROSTAT) 0.4 MG SL tablet Place 1 tablet (0.4 mg total) under the  tongue every 5 (five) minutes as needed for chest pain (up to 3 doses).  . pantoprazole (PROTONIX) 40 MG tablet TAKE 1 TABLET DAILY  . predniSONE (STERAPRED UNI-PAK 21 TAB) 10 MG (21) TBPK tablet Use as directed  . sertraline (ZOLOFT) 50 MG tablet TAKE 1 TABLET IN THE MORNING  . simvastatin (ZOCOR) 10 MG tablet TAKE 1 TABLET DAILY (Patient taking differently: TAKE 1 TABLET (10 MG) BY MOUTH  DAILY AT BEDTIME)  . ULORIC 40 MG tablet TAKE 1 TABLET DAILY  . warfarin (COUMADIN) 2 MG tablet TAKE 1 TABLET ALONG WITH 2.5MG  TO EQUAL 4.5 MG DAILY (Patient taking differently: TAKE 1 TABLET (2 MG) BY MOUTH EACH EVENING AT BEDTIME)  . warfarin (COUMADIN) 2.5 MG tablet TAKE 1 TABLET DAILY WITH 2 MG AS DIRECTED BY CPP (Patient taking differently: TAKE 1 TABLET (2.5 MG) DAILY WITH SUPPER)   No current facility-administered medications for this visit.     Allergies:   Lipitor [atorvastatin]    Social History:  The patient  reports that he quit smoking about 41 years ago. His smoking use included Cigarettes. He started smoking about 74 years ago. He has a 20.00 pack-year smoking history. He has never used smokeless tobacco. He reports that he does not use drugs. He had quit drinking for a while but has started back. 1-2 beers daily  Family History:  The patient's family history includes Arthritis  in his mother; Deep vein thrombosis in his father; Early death in his brother; Hypertension in his mother; Lung cancer in his sister.    ROS:  Please see the history of present illness. All other systems are reviewed and negative.    PHYSICAL EXAM: VS:  BP (!) 168/89   Pulse 63   Ht 5' 6.5" (1.689 m)  , BMI There is no height or weight on file to calculate BMI. GEN: Well nourished, well developed, male in no acute distress  HEENT: normal for age  Neck: JVD To jaw, no carotid bruit, no masses Cardiac: RRR; soft murmur, no rubs, or gallops Respiratory: Decreased breath sounds bases bilaterally with some basilar rales, slightly increased work of breathing with upper respiratory wheeze GI: soft, nontender, nondistended, + BS MS: no deformity or atrophy; 1-2 plus edema; distal pulses are 2+ in all 4 extremities   Skin: warm and dry, no rash Neuro:  Strength and sensation are intact Psych: euthymic mood, full affect   EKG:  EKG is ordered today. The ekg ordered today demonstrates sinus rhythm, heart rate 63 with one PVC. LBBB is old, QRS duration 128 ms  ECHO: 10/04/2014 (EF was 20-25% 2015) - Left ventricle: Abnormal septal motion The cavity size was   normal. There was mild concentric hypertrophy. Systolic function   was normal. The estimated ejection fraction was in the range of   50% to 55%. Doppler parameters are consistent with abnormal left   ventricular relaxation (grade 1 diastolic dysfunction). - Aortic valve: There was trivial regurgitation. - Mitral valve: Calcified annulus.   Recent Labs: 08/31/2016: ALT 12 12/03/2016: BUN 24; Creatinine, Ser 1.24; Hemoglobin 14.2; Platelets 160; Potassium 4.7; Sodium 141    Lipid Panel    Component Value Date/Time   CHOL 171 08/03/2016 1118   TRIG 151 (H) 08/03/2016 1118   TRIG 194 (H) 03/26/2014 1446   HDL 41 08/03/2016 1118   HDL 35 (L) 03/26/2014 1446   CHOLHDL 4.2 08/03/2016 1118   CHOLHDL 2.6 11/02/2012 1310   VLDL 44 (H)  11/02/2012 1310  LDLCALC 100 (H) 08/03/2016 1118   LDLCALC 42 05/17/2013 1101     Wt Readings from Last 3 Encounters:  12/09/16 169 lb (76.7 kg)  12/03/16 160 lb (72.6 kg)  06/01/16 168 lb 11.2 oz (76.5 kg)     Other studies Reviewed: Additional studies/ records that were reviewed today include: Office notes, hospital records and testing.  ASSESSMENT AND PLAN:  1.  Acute on chronic diastolic CHF: He is to increase Lasix to 40 mg twice a day for 3 days and then back to 40 mg daily. On Friday, he has an INR appointment and can get a BMET checked that day in [place]. His EF was 20-25 percent at one point, recheck an echocardiogram to make sure his EF is still normalized.  2. Hypertension: His blood pressure is elevated today but he is under physical stress because of coming down here and he is more short of breath today as well. His blood pressure can be assessed by his PCP or by Dr. Percival Spanish after his volume status is improved. His heart rate is borderline to increase his beta blocker and I would be reluctant to increase his standing dose of Lasix. Therefore additional blood pressure control would involve adding a third agent, choices influenced by the results of his echo. Hold off on additional med changes for now.   Current medicines are reviewed at length with the patient today.  The patient does not have concerns regarding medicines.  The following changes have been made:  Short-term increase in Lasix  Labs/ tests ordered today include:  No orders of the defined types were placed in this encounter.    Disposition:   FU with Dr. Percival Spanish  Signed, Lenoard Aden  12/14/2016 3:51 PM    [place] Phone: [phone]; Fax: [phone]  This note was written with the assistance of speech recognition software. Please excuse any transcriptional errors.

## 2016-12-14 NOTE — Patient Instructions (Addendum)
Medication Instructions:  TAKE ADDITIONAL LASIX 40MG  FOR 3 DAYS AND THEN BACK TO LASIX 40MG  DAILY If you need a refill on your cardiac medications before your next appointment, please call your pharmacy.  Labwork: BMET ON Friday AT PRIMARY CARE HERE IN OUR OFFICE AT [facility]  Follow-Up: Your physician wants you to follow-up in: 01-18-2017 @ 2PM WITH DR [place] ECHO Special Instructions: ECHO ORDER ENTERED AS REQUESTED BY [facility] PER RHONDA(SCHEDULED 01-18-17 BEFORE F/U APPT)  Thank you for choosing [facility] at [facility]!!

## 2016-12-15 ENCOUNTER — Telehealth: Payer: Self-pay | Admitting: *Deleted

## 2016-12-15 ENCOUNTER — Other Ambulatory Visit: Payer: Self-pay | Admitting: Family Medicine

## 2016-12-15 NOTE — Telephone Encounter (Signed)
Informed caregiver I would mail patient's insurance cards to him.  He left those at the office on 12/14/16.

## 2016-12-16 ENCOUNTER — Telehealth: Payer: Self-pay | Admitting: Family Medicine

## 2016-12-17 ENCOUNTER — Encounter: Payer: Self-pay | Admitting: Pharmacist Clinician (PhC)/ Clinical Pharmacy Specialist

## 2016-12-17 ENCOUNTER — Ambulatory Visit (INDEPENDENT_AMBULATORY_CARE_PROVIDER_SITE_OTHER): Payer: Medicare Other | Admitting: Pharmacist Clinician (PhC)/ Clinical Pharmacy Specialist

## 2016-12-17 VITALS — BP 131/67

## 2016-12-17 DIAGNOSIS — I4891 Unspecified atrial fibrillation: Secondary | ICD-10-CM | POA: Diagnosis not present

## 2016-12-17 DIAGNOSIS — Z86711 Personal history of pulmonary embolism: Secondary | ICD-10-CM

## 2016-12-17 LAB — COAGUCHEK XS/INR WAIVED
INR: 1.8 — ABNORMAL HIGH (ref 0.9–1.1)
Prothrombin Time: 21.9 s

## 2016-12-17 NOTE — Patient Instructions (Signed)
Anticoagulation Warfarin Dose Instructions as of 12/17/2016      Scott [name] Tue Wed Thu Fri Sat   New Dose 4.5 mg 4.5 mg 4.5 mg 4.5 mg 4.5 mg 4.5 mg 4.5 mg    Description   Continue taking warfarin as listed above.  INR today is 1.8 (goal is 2-3) a little thick today

## 2016-12-22 ENCOUNTER — Other Ambulatory Visit: Payer: Self-pay | Admitting: Family Medicine

## 2017-01-03 ENCOUNTER — Encounter: Payer: Self-pay | Admitting: Family Medicine

## 2017-01-03 ENCOUNTER — Ambulatory Visit: Payer: Medicare Other | Admitting: Family Medicine

## 2017-01-03 VITALS — BP 130/80 | HR 60 | Temp 98.5°F

## 2017-01-03 DIAGNOSIS — E7800 Pure hypercholesterolemia, unspecified: Secondary | ICD-10-CM

## 2017-01-03 DIAGNOSIS — I739 Peripheral vascular disease, unspecified: Secondary | ICD-10-CM

## 2017-01-03 DIAGNOSIS — I10 Essential (primary) hypertension: Secondary | ICD-10-CM

## 2017-01-03 DIAGNOSIS — J301 Allergic rhinitis due to pollen: Secondary | ICD-10-CM

## 2017-01-03 DIAGNOSIS — E559 Vitamin D deficiency, unspecified: Secondary | ICD-10-CM | POA: Diagnosis not present

## 2017-01-03 DIAGNOSIS — I4891 Unspecified atrial fibrillation: Secondary | ICD-10-CM | POA: Diagnosis not present

## 2017-01-03 DIAGNOSIS — K219 Gastro-esophageal reflux disease without esophagitis: Secondary | ICD-10-CM

## 2017-01-03 DIAGNOSIS — C679 Malignant neoplasm of bladder, unspecified: Secondary | ICD-10-CM

## 2017-01-03 DIAGNOSIS — I82423 Acute embolism and thrombosis of iliac vein, bilateral: Secondary | ICD-10-CM | POA: Diagnosis not present

## 2017-01-03 DIAGNOSIS — Z89612 Acquired absence of left leg above knee: Secondary | ICD-10-CM | POA: Diagnosis not present

## 2017-01-03 DIAGNOSIS — I130 Hypertensive heart and chronic kidney disease with heart failure and stage 1 through stage 4 chronic kidney disease, or unspecified chronic kidney disease: Secondary | ICD-10-CM

## 2017-01-03 DIAGNOSIS — N183 Chronic kidney disease, stage 3 unspecified: Secondary | ICD-10-CM

## 2017-01-03 MED ORDER — SPACER/AERO CHAMBER MOUTHPIECE MISC: 1 refills | Status: DC

## 2017-01-03 MED ORDER — ALBUTEROL SULFATE HFA 108 (90 BASE) MCG/ACT IN AERS: 2.0000 | INHALATION_SPRAY | Freq: Four times a day (QID) | RESPIRATORY_TRACT | 6 refills | Status: DC | PRN

## 2017-01-03 NOTE — Progress Notes (Addendum)
Subjective:    Patient ID: Scott [name], male    DOB: 12-Jun-1921, 83 y.o.   MRN: [id number]  HPI Pt seen for follow up and management of chronic medical problems in his home. He is accompanied by his caregivers. The patient was alert and stable in his wheelchair with out his prosthesis below his left knee. He is being followed regularly for his bladder cancer and his atrial fibrillation. He sees Dr. [name] and he sees Dr. Percival Spanish. The patient denied any chest pain but does have occasional bouts of wheezing and shortness of breath. He is currently not using an inhaler. He denies any chest pain trouble with his stomach including nausea vomiting diarrhea or blood in the stool. He also has some incontinence and says he has trouble getting to the restroom on time. He will be seeing the urologist the end of this week. He does complain of some nasal congestion. The patient generally stays in a wheelchair and does not use his prosthesis that much because he said it does not fit like it should. When he does use it he is walking only with assistance.   Patient Active Problem List   Diagnosis Date Noted  . Thoracic aortic atherosclerosis ([place]) 11/26/2016  . Thrombocytopenia ([place]) 08/04/2016  . Chronic kidney disease (CKD) stage G3a/A1, moderately decreased glomerular filtration rate (GFR) between 45-59 mL/min/1.73 square meter and albuminuria creatinine ratio less than 30 mg/g 08/04/2016  . Essential hypertension 11/29/2015  . Bradycardia 11/28/2015  . Malignant neoplasm of lateral wall of urinary bladder ([place]) 07/10/2015  . Status post above knee amputation of left lower extremity ([place]) 10/18/2014  . Tachyarrhythmia 10/05/2014  . Aspiration pneumonia ([place]) 10/05/2014  . UTI (lower urinary tract infection) 10/03/2014  . Sepsis due to other etiology ([place]) 10/03/2014  . Gangrene of toe ([place]) 10/03/2014  . Ischemic ulcer of left foot ([place]) 10/03/2014  . Atherosclerotic PVD with ulceration ([place])  10/03/2014  . Warfarin-induced coagulopathy ([place]) 10/03/2014  . Atrial fibrillation with RVR ([place]) 10/03/2014  . Acute kidney injury ([place]) 10/03/2014  . Hyperkalemia 10/03/2014  . Cellulitis of left leg 10/03/2014  . PAD (peripheral artery disease) ([place]) 09/24/2014  . NSTEMI (non-ST elevated myocardial infarction) ([place]) 10/09/2013  . High risk medication use 05/17/2013  . BPH (benign prostatic hyperplasia) 05/17/2013  . Vitamin D deficiency 05/17/2013  . History of pulmonary embolism (2014) 04/17/2013  . NSVT (nonsustained ventricular tachycardia)- not an ICD candidate 04/17/2013  . NICM (nonischemic cardiomyopathy)- EF 20-25% by Echo 04/12/13 04/17/2013  . Chronic diastolic heart failure, NYHA class 1 ([place]) 03/15/2013  . DVT of lower extremity, bilateral ([place]) 03/15/2013  . Fatigue 11/02/2012  . Constipation 11/02/2012  . Alcohol abuse 08/27/2012  . Syncope 06/21/2012  . Bladder cancer ([place]) 03/15/2012  . Benign localized hyperplasia of prostate with urinary obstruction 03/15/2012  . Benign essential HTN   . Hyperlipidemia   . PVCs (premature ventricular contractions)   . CAD- non obstructive disease by cath 3/14    Outpatient Encounter Prescriptions as of 01/03/2017  Medication Sig  . acetaminophen (TYLENOL) 500 MG tablet Take 500 mg by mouth every 6 (six) hours as needed for moderate pain or headache.  . donepezil (ARICEPT) 10 MG tablet TAKE ONE TABLET AT BEDTIME (Patient taking differently: TAKE ONE TABLET (10 MG) BY MOUTH ONCE DAILY IN THE MORNING)  . ferrous sulfate 325 (65 FE) MG tablet Take 1 tablet (325 mg total) by mouth daily with breakfast.  . fluticasone (FLONASE) 50 MCG/ACT  nasal spray Place 1 spray into both nostrils daily as needed for allergies or rhinitis. (Patient taking differently: Place 1 spray into both nostrils at bedtime. )  . folic acid (FOLVITE) 1 MG tablet TAKE 1 TABLET DAILY  . furosemide (LASIX) 40 MG tablet TAKE 1 TABLET EVERY DAY  . gabapentin  (NEURONTIN) 100 MG capsule TAKE (2) CAPSULES TWICE DAILY.  . isosorbide dinitrate (ISORDIL) 30 MG tablet TAKE 1 TABLET ONCE A DAY  . metoprolol succinate (TOPROL-XL) 50 MG 24 hr tablet TAKE 1 TABLET DAILY  . MYRBETRIQ 25 MG TB24 tablet Take 25 mg by mouth daily.   . nitroGLYCERIN (NITROSTAT) 0.4 MG SL tablet Place 1 tablet (0.4 mg total) under the tongue every 5 (five) minutes as needed for chest pain (up to 3 doses).  . pantoprazole (PROTONIX) 40 MG tablet TAKE 1 TABLET DAILY  . sertraline (ZOLOFT) 50 MG tablet TAKE 1 TABLET IN THE MORNING  . simvastatin (ZOCOR) 10 MG tablet TAKE 1 TABLET DAILY (Patient taking differently: TAKE 1 TABLET (10 MG) BY MOUTH  DAILY AT BEDTIME)  . ULORIC 40 MG tablet TAKE 1 TABLET DAILY  . warfarin (COUMADIN) 2 MG tablet TAKE 1 TABLET ALONG WITH 2.5MG  TO EQUAL 4.5 MG DAILY (Patient taking differently: TAKE 1 TABLET (2 MG) BY MOUTH EACH EVENING AT BEDTIME)  . warfarin (COUMADIN) 2.5 MG tablet TAKE 1 TABLET DAILY WITH 2 MG AS DIRECTED BY CPP  . [DISCONTINUED] predniSONE (STERAPRED UNI-PAK 21 TAB) 10 MG (21) TBPK tablet Use as directed   No facility-administered encounter medications on file as of 01/03/2017.       Review of Systems  Constitutional: Negative.   HENT: Negative.   Eyes: Negative.   Respiratory: Negative.   Cardiovascular: Positive for leg swelling (right ).  Gastrointestinal: Positive for abdominal distention (gas).  Endocrine: Negative.   Genitourinary: Negative.   Musculoskeletal: Negative.   Skin: Negative.   Allergic/Immunologic: Negative.   Neurological: Negative.   Hematological: Negative.   Psychiatric/Behavioral: Negative.        Objective:   Physical Exam  Constitutional: He is oriented to person, place, and time. No distress.  Elderly but alert and appreciative for our home visit  HENT:  Head: Normocephalic and atraumatic.  Nose: Nose normal.  Mouth/Throat: Oropharynx is clear and moist. No oropharyngeal exudate.  Eyes:  Pupils are equal, round, and reactive to light. Conjunctivae and EOM are normal. Right eye exhibits no discharge. Left eye exhibits no discharge. No scleral icterus.  Neck: Normal range of motion. Neck supple. No thyromegaly present.  Cardiovascular: Normal rate and normal heart sounds.   No murmur heard. The heart is irregular irregular at 60/m  Pulmonary/Chest: Effort normal and breath sounds normal. No respiratory distress. He has no wheezes. He has no rales.  No wheezes rhonchi or rales.  Abdominal: Soft. Bowel sounds are normal. He exhibits distension. He exhibits no mass. There is no tenderness. There is no rebound and no guarding.  No masses tenderness or organ enlargement or bruits. There was a lot of gaseous distention.  Musculoskeletal: He exhibits edema and deformity. He exhibits no tenderness.  The patient has a below the knee amputation on the left. The right lower extremity was normal appearing with only minimal edema.  Lymphadenopathy:    He has no cervical adenopathy.  Neurological: He is alert and oriented to person, place, and time.  Skin: Skin is warm and dry. No rash noted.  Psychiatric: He has a normal mood and affect. His  behavior is normal. Judgment and thought content normal.  Nursing note and vitals reviewed.  BP 130/80 (BP Location: Left Arm)   Pulse 60   Temp 98.5 F (36.9 C) (Oral)   25 minutes of time was spent examining this patient talking to his caregiver and reviewing his history with him.      Assessment & Plan:  1. Atrial fibrillation with RVR ([place]) -Continue to follow-up with cardiology -He will continue with Coumadin  2. Pure hypercholesterolemia -Continue with aggressive therapeutic lifestyle changes and simvastatin.  3. Gastroesophageal reflux disease, esophagitis presence not specified -No complaints with reflux and he will continue with his protonix  4. Essential hypertension -Blood pressure is good today and he will continue with  current treatment  5. Vitamin D deficiency -Currently not on vitamin D but will get vitamin D level with blood draw  6. Deep vein thrombosis (DVT) of iliac vein of both lower extremities, unspecified chronicity (HCC) -Continue Coumadin  7. Allergic rhinitis due to pollen, unspecified seasonality -Use nasal saline as directed  8. Peripheral vascular insufficiency ([place]) -Follow-up with cardiology as planned  9. S/P AKA (above knee amputation) unilateral, left (HCC) -Continue to make efforts with walking with prosthesis  10. Malignant neoplasm of urinary bladder, unspecified site [facility]) -Follow-up with urology as planned  11. 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 albuterol (PROVENTIL HFA;VENTOLIN HFA) 108 (90 Base) MCG/ACT inhaler    Sig: Inhale 2 puffs into the lungs every 6 (six) hours as needed for wheezing or shortness of breath.    Dispense:  1 Inhaler    Refill:  6  . Spacer/Aero Chamber [employer] MISC    Sig: Use with Albuterol HFA inhaler as directed    Dispense:  1 each    Refill:  1   Patient Instructions                       Medicare Annual Wellness Visit  [facility] and the medical providers at [facility] strive to bring you the best medical care.  In doing so we not only want to address your current medical conditions and concerns but also to detect new conditions early and prevent illness, disease and health-related problems.    Medicare offers a yearly Wellness Visit which allows our clinical staff to assess your need for preventative services including immunizations, lifestyle education, counseling to decrease risk of preventable diseases and screening for fall risk and other medical concerns.    This visit is provided free of charge (no copay) for all Medicare recipients. The clinical pharmacists at [facility]  have begun to conduct these Wellness Visits which will also include a thorough review of all your medications.    As you primary medical provider recommend that you make an appointment for your Annual Wellness Visit if you have not done so already this year.  You may set up this appointment before you leave today or you may call back ([phone]) and schedule an appointment.  Please make sure when you call that you mention that you are scheduling your Annual Wellness Visit with the clinical pharmacist so that the appointment may be made for the proper length of time.     Continue current medications. Continue good therapeutic lifestyle changes which include good diet and exercise. Fall precautions discussed with patient. If an FOBT was given today-  please return it to our front desk. If you are over 71 years old - you may need Prevnar 52 or the adult Pneumonia vaccine.  **Flu shots are available--- please call and schedule a FLU-CLINIC appointment**  After your visit with [place] today you will receive a survey in the mail or online from [employer] regarding your care with [place]. Please take a moment to fill this out. Your feedback is very important to [place] as you can help [place] better understand your patient needs as well as improve your experience and satisfaction. WE CARE ABOUT YOU!!!   The patient should follow-up with cardiology and urology as planned He should get some nasal saline and use this 1 spray each nostril 3 or 4 times daily for the head congestion is been having He should use the albuterol inhaler once or twice daily if needed for wheezes. A spacer was also written for to make the inhaler use easier.    Arrie Senate MD

## 2017-01-03 NOTE — Patient Instructions (Addendum)
Medicare Annual Wellness Visit  [facility] and the medical providers at [facility] strive to bring you the best medical care.  In doing so we not only want to address your current medical conditions and concerns but also to detect new conditions early and prevent illness, disease and health-related problems.    Medicare offers a yearly Wellness Visit which allows our clinical staff to assess your need for preventative services including immunizations, lifestyle education, counseling to decrease risk of preventable diseases and screening for fall risk and other medical concerns.    This visit is provided free of charge (no copay) for all Medicare recipients. The clinical pharmacists at [facility] have begun to conduct these Wellness Visits which will also include a thorough review of all your medications.    As you primary medical provider recommend that you make an appointment for your Annual Wellness Visit if you have not done so already this year.  You may set up this appointment before you leave today or you may call back ([phone]) and schedule an appointment.  Please make sure when you call that you mention that you are scheduling your Annual Wellness Visit with the clinical pharmacist so that the appointment may be made for the proper length of time.     Continue current medications. Continue good therapeutic lifestyle changes which include good diet and exercise. Fall precautions discussed with patient. If an FOBT was given today- please return it to our front desk. If you are over 81 years old - you may need Prevnar 68 or the adult Pneumonia vaccine.  **Flu shots are available--- please call and schedule a FLU-CLINIC appointment**  After your visit with [place] today you will receive a survey in the mail or online from [employer] regarding your care with [place]. Please take a moment to fill this out. Your feedback is very  important to [place] as you can help [place] better understand your patient needs as well as improve your experience and satisfaction. WE CARE ABOUT YOU!!!   The patient should follow-up with cardiology and urology as planned He should get some nasal saline and use this 1 spray each nostril 3 or 4 times daily for the head congestion is been having He should use the albuterol inhaler once or twice daily if needed for wheezes. A spacer was also written for to make the inhaler use easier.

## 2017-01-07 ENCOUNTER — Other Ambulatory Visit: Payer: Self-pay | Admitting: Family Medicine

## 2017-01-07 ENCOUNTER — Encounter: Payer: Self-pay | Admitting: Cardiology

## 2017-01-07 ENCOUNTER — Other Ambulatory Visit (HOSPITAL_COMMUNITY)
Admission: AD | Admit: 2017-01-07 | Discharge: 2017-01-07 | Disposition: A | Discharge status: Home / Self Care | Payer: Medicare Other | Source: Other Acute Inpatient Hospital | Attending: Urology | Admitting: Urology

## 2017-01-07 ENCOUNTER — Ambulatory Visit (INDEPENDENT_AMBULATORY_CARE_PROVIDER_SITE_OTHER): Payer: Medicare Other | Admitting: Urology

## 2017-01-07 DIAGNOSIS — R3915 Urgency of urination: Secondary | ICD-10-CM | POA: Diagnosis not present

## 2017-01-07 DIAGNOSIS — N3941 Urge incontinence: Secondary | ICD-10-CM | POA: Diagnosis not present

## 2017-01-07 DIAGNOSIS — Z8551 Personal history of malignant neoplasm of bladder: Secondary | ICD-10-CM

## 2017-01-07 LAB — URINALYSIS, COMPLETE (UACMP) WITH MICROSCOPIC
Bilirubin Urine: NEGATIVE
Glucose, UA: NEGATIVE mg/dL
Ketones, ur: NEGATIVE mg/dL
Nitrite: NEGATIVE
Protein, ur: NEGATIVE mg/dL
Specific Gravity, Urine: 1.008 (ref 1.005–1.030)
pH: 5 (ref 5.0–8.0)

## 2017-01-09 LAB — URINE CULTURE

## 2017-01-13 ENCOUNTER — Other Ambulatory Visit: Payer: Self-pay | Admitting: Family Medicine

## 2017-01-17 ENCOUNTER — Encounter: Payer: Self-pay | Admitting: Cardiology

## 2017-01-17 NOTE — Progress Notes (Signed)
Cardiology Office Note   Date:  01/18/2017   ID:  Scott [name], DOB 1921-06-01, MRN [id number]  PCP:  Chipper Herb, MD  Cardiologist:   Minus Breeding, MD    Chief Complaint  Patient presents with  . Shortness of Breath      History of Present Illness: Scott [name] is a 83 y.o. male who presents for follow up of  NICM (06/2012 cath with patent cors - EF 40%,  2014 20-25%, 45% 2016), HTN, chronic LBBB, postural syncope related to diuretics, EtOH abuse, and DVT.  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  We saw him prior to a TURP most recently.   When he was seen in the office early last month he had some increased dyspnea and edema and required some increased Lasix.     Since that time he has done well.  He recently fell out of his motorized wheelchair when he hit a ditch in his yard while he was chasing a buzzard.  (Not a typo).  He had no trauma and we had a good laugh about this at the expense of his aide who had to pick him up and dislodge the wheelchair from the ditch.    He otherwise as done well.  The patient denies any new symptoms such as chest discomfort, neck or arm discomfort. There has been no new shortness of breath, PND or orthopnea. There have been no reported palpitations, presyncope or syncope.  He might get some SOB. But responds to bronchodilators.  He has some leg edema but no PND or orthopnea.  He cannot weigh daily  Of note he had an echo today.  I reviewed the images as below.    Past Medical History:  Diagnosis Date  . Arthritis    "all over"  . Bladder cancer ([place]) 03/15/2012   pt denies this hx on 10/15/2014  . Bradycardia 11/28/2015   Severe-HR in the 20s to 30s following intubation for urological procedure.  Marland Kitchen CAD- non obstructive disease by cath 3/14   . Chronic lower back pain   . Chronic systolic CHF (congestive heart failure) ([place])    a. NICM - patent cors 06/2012, EF 40% at that time. b. 03/2013 eval: EF 20-25%.  . Chronic systolic heart  failure ([place]) 10/04/14   EF improved 50-55%  . DDD (degenerative disc disease)   . Depression   . DVT (deep venous thrombosis) ([place])    "I think in both legs"  . DVT, bilateral lower limbs ([place]) 03/2013   a. Bilat DVT dx 03/2013.  Marland Kitchen Elevated troponin    a. Adm 12/30-04/2013 - not felt to represent ACS; ?due to PE. b. Patent cors 06/2012.  Marland Kitchen ETOH abuse   . Frequency of urination   . Gangrene of toe ([place]) 10/03/2014  . Glaucoma   . HTN (hypertension)   . Hyperlipidemia   . Hypertension   . Nocturia   . NSVT (nonsustained ventricular tachycardia) ([place])    a. NSVT 06/2012; NSVT also seen during 03/2013 adm. b. Med rx. Not candidate for ICD given adv age.  Marland Kitchen PAD (peripheral artery disease) ([place])   . Pneumonia    "couple times"  . 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 Pulmonary embolism ([place])    a. By CT angio 04/2013.  Marland Kitchen PVCs (premature ventricular contractions)   . PVD (peripheral vascular  disease) ([place])    Right ABI .75, Left .78 (2006)  . Sepsis due to other etiology ([place]) 10/03/2014  . Sleep apnea   . Syncope    a. Felt to be postural syncope related to diuretics 06/2012.    Past Surgical History:  Procedure Laterality Date  . AMPUTATION Left 10/16/2014   Procedure: AMPUTATION ABOVE KNEE- LEFT;  Surgeon: Mal Misty, MD;  Location: [facility];  Service: Vascular;  Laterality: Left;  . CARDIAC CATHETERIZATION  05-11-2004  DR [facility]   MINIMAL CORANARY PLAQUE/ NORMAL LVF/ EF 55%/  NON-OBSTRUCTIVE LAD 25%  . CARDIAC CATHETERIZATION  06/2012  . CARDIOVASCULAR STRESS TEST  10-15-2010   LOW RISK NUCLEAR STUDY/ NO EVIDENCE OF ISCHEMIA/ NORMAL EF  . CATARACT EXTRACTION W/ INTRAOCULAR LENS  IMPLANT, BILATERAL Bilateral   . CYSTOSCOPY  10/07/2011   Procedure: CYSTOSCOPY;  Surgeon: Malka So, MD;  Location: [facility];  Service: Urology;  Laterality: N/A;  . CYSTOSCOPY WITH BIOPSY   03/14/2012   Procedure: CYSTOSCOPY WITH BIOPSY;  Surgeon: Malka So, MD;  Location: [facility];  Service: Urology;  Laterality: N/A;  WITH FULGURATION  . CYSTOSCOPY WITH BIOPSY N/A 12/09/2016   Procedure: CYSTOSCOPY WITH BIOPSY AND FULGURATION;  Surgeon: [name], MD;  Location: [facility];  Service: Urology;  Laterality: N/A;  . CYSTOSCOPY WITH FULGERATION N/A 01/20/2016   Procedure: CYSTOSCOPY, BIOPSY WITH FULGERATION OF URTHRAL TUMOR;  Surgeon: [name], MD;  Location: [facility];  Service: Urology;  Laterality: N/A;  . CYSTOSCOPY WITH FULGERATION N/A 06/01/2016   Procedure: CYSTOSCOPY WITH FULGERATION urethral tumors and bladder neck;  Surgeon: [name], MD;  Location: [facility];  Service: Urology;  Laterality: N/A;  . EYE SURGERY    . SHOULDER SURGERY  1970's   "don't remember which side or what kind of OR"  . TONSILLECTOMY    . TRANSTHORACIC ECHOCARDIOGRAM  09-22-2010   MODERATE LVH/ EF 82%/ GRADE I DIASTOLIC DYSFUNCTION/ AORTIC SCLEROSIS WITHOUT STENOSIS/  RV  SYSTOLIC  MILDLY REDUCED FUNCTION  . TRANSURETHRAL RESECTION OF BLADDER TUMOR  10/07/2011   Procedure: TRANSURETHRAL RESECTION OF BLADDER TUMOR (TURBT);  Surgeon: Malka So, MD;  Location: [facility];  Service: Urology;  Laterality: N/A;  . TRANSURETHRAL RESECTION OF BLADDER TUMOR N/A 07/10/2015   Procedure: TRANSURETHRAL RESECTION OF BLADDER TUMOR (TURBT), CYSTOSCOPY ;  Surgeon: [name], MD;  Location: [facility];  Service: Urology;  Laterality: N/A;  . TRANSURETHRAL RESECTION OF PROSTATE  03/14/2012   Procedure: TRANSURETHRAL RESECTION OF THE PROSTATE WITH GYRUS INSTRUMENTS;  Surgeon: Malka So, MD;  Location: [facility];  Service: Urology;  Laterality: N/A;     Current Outpatient Prescriptions  Medication Sig Dispense Refill  . acetaminophen (TYLENOL) 500 MG tablet Take 500 mg by mouth every 6 (six) hours as needed for moderate pain or headache.    . albuterol (PROVENTIL HFA;VENTOLIN HFA) 108 (90 Base) MCG/ACT inhaler  Inhale 2 puffs into the lungs every 6 (six) hours as needed for wheezing or shortness of breath. 1 Inhaler 6  . donepezil (ARICEPT) 10 MG tablet TAKE ONE TABLET AT BEDTIME (Patient taking differently: TAKE ONE TABLET (10 MG) BY MOUTH ONCE DAILY IN THE MORNING) 30 tablet 5  . ferrous sulfate 325 (65 FE) MG tablet Take 1 tablet (325 mg total) by mouth daily with breakfast. 30 tablet 4  . fluticasone (FLONASE) 50 MCG/ACT nasal spray Place 1 spray into both nostrils daily as needed for allergies or rhinitis. (Patient  taking differently: Place 1 spray into both nostrils at bedtime. ) 16 g 2  . folic acid (FOLVITE) 1 MG tablet TAKE 1 TABLET DAILY 30 tablet 0  . furosemide (LASIX) 40 MG tablet TAKE 1 TABLET EVERY DAY 30 tablet 5  . gabapentin (NEURONTIN) 100 MG capsule TAKE (2) CAPSULES TWICE DAILY. 120 capsule 3  . isosorbide dinitrate (ISORDIL) 30 MG tablet TAKE 1 TABLET ONCE A DAY 30 tablet 2  . metoprolol succinate (TOPROL-XL) 50 MG 24 hr tablet TAKE 1 TABLET DAILY 30 tablet 5  . MYRBETRIQ 25 MG TB24 tablet Take 25 mg by mouth daily.     . nitroGLYCERIN (NITROSTAT) 0.4 MG SL tablet Place 1 tablet (0.4 mg total) under the tongue every 5 (five) minutes as needed for chest pain (up to 3 doses). 25 tablet 3  . pantoprazole (PROTONIX) 40 MG tablet TAKE 1 TABLET DAILY 90 tablet 1  . sertraline (ZOLOFT) 50 MG tablet TAKE 1 TABLET IN THE MORNING 90 tablet 2  . simvastatin (ZOCOR) 10 MG tablet TAKE 1 TABLET DAILY (Patient taking differently: TAKE 1 TABLET (10 MG) BY MOUTH  DAILY AT BEDTIME) 30 tablet 5  . Spacer/Aero Chamber [employer] Use with Albuterol HFA inhaler as directed 1 each 1  . ULORIC 40 MG tablet TAKE 1 TABLET DAILY 30 tablet 6  . warfarin (COUMADIN) 2 MG tablet TAKE 1 TABLET ALONG WITH 2.5MG  TO EQUAL 4.5 MG DAILY 30 tablet 5  . warfarin (COUMADIN) 2.5 MG tablet TAKE 1 TABLET DAILY WITH 2 MG AS DIRECTED BY CPP 30 tablet 4   No current facility-administered medications for this visit.      Allergies:   Lipitor [atorvastatin]    ROS:  Please see the history of present illness.   Otherwise, review of systems are positive for none.   All other systems are reviewed and negative.    PHYSICAL EXAM: VS:  BP 98/70   Pulse 70   Ht 5' 6.5" (1.689 m)  , BMI There is no height or weight on file to calculate BMI.  PHYSICAL EXAM GEN:  No distress NECK:  Positive JVD at 90 degrees, waveform within normal limits, carotid upstroke brisk and symmetric, no bruits, no thyromegaly LUNGS:  Clear to auscultation bilaterally BACK:  No CVA tenderness CHEST:  Unremarkable HEART:  S1 and S2 within normal limits, no S3, no S4, no clicks, no rubs, no murmurs ABD:  Positive bowel sounds normal in frequency in pitch, no bruits, no rebound, no guarding, unable to assess midline mass or bruit with the patient seated. EXT:  2 plus pulses throughout, mild edema, no cyanosis no clubbing, left AKA    EKG:  EKG is not ordered today.   Recent Labs: 08/31/2016: ALT 12 12/03/2016: BUN 24; Creatinine, Ser 1.24; Hemoglobin 14.2; Platelets 160; Potassium 4.7; Sodium 141    Lipid Panel    Component Value Date/Time   CHOL 171 08/03/2016 1118   TRIG 151 (H) 08/03/2016 1118   TRIG 194 (H) 03/26/2014 1446   HDL 41 08/03/2016 1118   HDL 35 (L) 03/26/2014 1446   CHOLHDL 4.2 08/03/2016 1118   CHOLHDL 2.6 11/02/2012 1310   VLDL 44 (H) 11/02/2012 1310   LDLCALC 100 (H) 08/03/2016 1118   LDLCALC 42 05/17/2013 1101      Wt Readings from Last 3 Encounters:  12/09/16 169 lb (76.7 kg)  12/03/16 160 lb (72.6 kg)  06/01/16 168 lb 11.2 oz (76.5 kg)      Other studies  Reviewed: Additional studies/ records that were reviewed today include: Echo images reviewed. Review of the above records demonstrates:  Please see elsewhere in the note.     ASSESSMENT AND PLAN:   Chronic Diastolic heart failure - I think that he is euvolemic.  His EF might be mildly reduced compared with previous.  However, I will  await the final echo results.  I don't plan any med changes.    PVCs (premature ventricular contractions) -  He does not feel this.  No change in therapy.    HTN (hypertension) -  The blood pressure is at target. No change in medications is indicated. We will continue with therapeutic lifestyle changes (TLC).  In fact the BP is low.  I will continue meds as listed.   DVT/PULMONARY EMBOLI-    INR is followed closely by Dr. Laurance Flatten.      ATRIAL FIB -   Maintaining NSR.  Continue rate control and anticoagulation.    [name] has a CHA2DS2 - VASc score of 4 with a risk of stroke of 4%.  He was not anemic at the last visit.      Current medicines are reviewed at length with the patient today.  The patient does not have concerns regarding medicines.  The following changes have been made:  no change  Labs/ tests ordered today include: None No orders of the defined types were placed in this encounter.    Disposition:   FU with Rosaria Ferries PAC in six months.     Signed, Minus Breeding, MD  01/18/2017 10:00 PM    [place]

## 2017-01-18 ENCOUNTER — Other Ambulatory Visit: Payer: Self-pay

## 2017-01-18 ENCOUNTER — Ambulatory Visit (INDEPENDENT_AMBULATORY_CARE_PROVIDER_SITE_OTHER): Payer: Medicare Other | Admitting: Cardiology

## 2017-01-18 ENCOUNTER — Encounter: Payer: Self-pay | Admitting: Cardiology

## 2017-01-18 ENCOUNTER — Ambulatory Visit (HOSPITAL_COMMUNITY): Payer: Medicare Other | Attending: Internal Medicine

## 2017-01-18 VITALS — BP 98/70 | HR 70 | Ht 66.5 in

## 2017-01-18 DIAGNOSIS — I4819 Other persistent atrial fibrillation: Secondary | ICD-10-CM

## 2017-01-18 DIAGNOSIS — R0989 Other specified symptoms and signs involving the circulatory and respiratory systems: Secondary | ICD-10-CM | POA: Diagnosis not present

## 2017-01-18 DIAGNOSIS — I5033 Acute on chronic diastolic (congestive) heart failure: Secondary | ICD-10-CM

## 2017-01-18 DIAGNOSIS — I10 Essential (primary) hypertension: Secondary | ICD-10-CM | POA: Diagnosis not present

## 2017-01-18 DIAGNOSIS — I480 Paroxysmal atrial fibrillation: Secondary | ICD-10-CM | POA: Insufficient documentation

## 2017-01-18 DIAGNOSIS — I5032 Chronic diastolic (congestive) heart failure: Secondary | ICD-10-CM

## 2017-01-18 DIAGNOSIS — I481 Persistent atrial fibrillation: Secondary | ICD-10-CM | POA: Diagnosis not present

## 2017-01-18 DIAGNOSIS — I272 Pulmonary hypertension, unspecified: Secondary | ICD-10-CM | POA: Diagnosis not present

## 2017-01-18 DIAGNOSIS — I420 Dilated cardiomyopathy: Secondary | ICD-10-CM | POA: Diagnosis not present

## 2017-01-18 DIAGNOSIS — I071 Rheumatic tricuspid insufficiency: Secondary | ICD-10-CM | POA: Insufficient documentation

## 2017-01-18 NOTE — Patient Instructions (Signed)
Medication Instructions:  Continue current medications  If you need a refill on your cardiac medications before your next appointment, please call your pharmacy.  Labwork: None Ordered   Testing/Procedures: None Ordered  Follow-Up: Your physician wants you to follow-up in: 6 Months with Rhonda Barrett. You should receive a reminder letter in the mail two months in advance. If you do not receive a letter, please call our office [phone].     Thank you for choosing [facility]!!      

## 2017-01-19 ENCOUNTER — Other Ambulatory Visit: Payer: Self-pay | Admitting: Family Medicine

## 2017-01-21 ENCOUNTER — Encounter: Payer: Self-pay | Admitting: Pharmacist Clinician (PhC)/ Clinical Pharmacy Specialist

## 2017-01-21 ENCOUNTER — Telehealth: Payer: Self-pay | Admitting: Family Medicine

## 2017-01-21 NOTE — Telephone Encounter (Signed)
appt rescheduled.  Patient aware  

## 2017-01-22 DIAGNOSIS — R062 Wheezing: Secondary | ICD-10-CM | POA: Diagnosis not present

## 2017-01-22 DIAGNOSIS — R05 Cough: Secondary | ICD-10-CM | POA: Diagnosis not present

## 2017-01-22 DIAGNOSIS — I509 Heart failure, unspecified: Secondary | ICD-10-CM | POA: Diagnosis not present

## 2017-01-22 DIAGNOSIS — J209 Acute bronchitis, unspecified: Secondary | ICD-10-CM | POA: Diagnosis not present

## 2017-01-24 ENCOUNTER — Encounter: Payer: Medicare Other | Admitting: *Deleted

## 2017-01-27 ENCOUNTER — Encounter: Payer: Self-pay | Admitting: Family Medicine

## 2017-01-27 ENCOUNTER — Ambulatory Visit (INDEPENDENT_AMBULATORY_CARE_PROVIDER_SITE_OTHER): Payer: Medicare Other | Admitting: Family Medicine

## 2017-01-27 ENCOUNTER — Telehealth: Payer: Self-pay | Admitting: Family Medicine

## 2017-01-27 ENCOUNTER — Ambulatory Visit: Payer: Medicare Other | Admitting: Family Medicine

## 2017-01-27 ENCOUNTER — Ambulatory Visit (INDEPENDENT_AMBULATORY_CARE_PROVIDER_SITE_OTHER): Payer: Medicare Other

## 2017-01-27 VITALS — BP 146/76 | HR 84 | Temp 98.2°F | Resp 16

## 2017-01-27 DIAGNOSIS — R062 Wheezing: Secondary | ICD-10-CM | POA: Diagnosis not present

## 2017-01-27 DIAGNOSIS — J209 Acute bronchitis, unspecified: Secondary | ICD-10-CM

## 2017-01-27 MED ORDER — PREDNISONE 20 MG PO TABS: 40.0000 mg | ORAL_TABLET | Freq: Every day | ORAL | 0 refills | Status: DC

## 2017-01-27 MED ORDER — BENZONATATE 200 MG PO CAPS: 200.0000 mg | ORAL_CAPSULE | Freq: Two times a day (BID) | ORAL | 0 refills | Status: DC | PRN

## 2017-01-27 NOTE — Telephone Encounter (Signed)
appt scheduled Pt notified 

## 2017-01-27 NOTE — Progress Notes (Signed)
Subjective: CC: ?URI PCP: Chipper Herb, MD Scott [name] is a 83 y.o. male, who is accompanied to the appointment I his son. He is presenting to clinic today for:  Patient was seen on 01/22/2017 at urgent care for bronchitis and possible CHF exacerbation. His pulse ox and vital signs were normal at that time except for a mildly elevated systolic blood pressure. Chest x-ray was obtained at that time which did not reveal any acute processes. He was discharged with a Z-Pak, Tessalon Perles, Atrovent inhaler and instructed to increase his Lasix until Monday. Today he reports persistent cough. He reports associated wheeze. He notes that cough was relieved by the sauna [employer] but he is now out of that medication. He notes that symptoms improved slightly up until about 2 days ago. His son reports that he is unsure if he is using his inhaler property and would appreciate a demonstration.  Cardiology notes reviewed. A recent echocardiogram showed an EF of 35-40% with diffuse hypokinesis. This is actually slightly improved compared to the previous where his EF was 20-25%. Denies increased lower extremity edema, orthopnea.  Allergies  Allergen Reactions  . Lipitor [Atorvastatin] Other (See Comments)    unknown   Past Medical History:  Diagnosis Date  . Arthritis    "all over"  . Bladder cancer ([place]) 03/15/2012   pt denies this hx on 10/15/2014  . Bradycardia 11/28/2015   Severe-HR in the 20s to 30s following intubation for urological procedure.  Marland Kitchen CAD- non obstructive disease by cath 3/14   . Chronic lower back pain   . Chronic systolic CHF (congestive heart failure) ([place])    a. NICM - patent cors 06/2012, EF 40% at that time. b. 03/2013 eval: EF 20-25%.  . Chronic systolic heart failure ([place]) 10/04/14   EF improved 50-55%  . DDD (degenerative disc disease)   . Depression   . DVT (deep venous thrombosis) ([place])    "I think in both legs"  . DVT, bilateral lower limbs ([place]) 03/2013   a. Bilat  DVT dx 03/2013.  Marland Kitchen Elevated troponin    a. Adm 12/30-04/2013 - not felt to represent ACS; ?due to PE. b. Patent cors 06/2012.  Marland Kitchen ETOH abuse   . Frequency of urination   . Gangrene of toe ([place]) 10/03/2014  . Glaucoma   . HTN (hypertension)   . Hyperlipidemia   . Hypertension   . Nocturia   . NSVT (nonsustained ventricular tachycardia) ([place])    a. NSVT 06/2012; NSVT also seen during 03/2013 adm. b. Med rx. Not candidate for ICD given adv age.  Marland Kitchen PAD (peripheral artery disease) ([place])   . Pneumonia    "couple times"  . 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 Pulmonary embolism ([place])    a. By CT angio 04/2013.  Marland Kitchen PVCs (premature ventricular contractions)   . PVD (peripheral vascular disease) ([facility])    Right ABI .75, Left .78 (2006)  . Sepsis due to other etiology ([place]) 10/03/2014  . Sleep apnea   . Syncope    a. Felt to be postural syncope related to diuretics 06/2012.   Family History  Problem Relation Age of Onset  . Hypertension Mother   . Arthritis Mother   . Lung cancer Sister   . Deep vein thrombosis Father   . Early death Brother     Current Outpatient Prescriptions:  .  acetaminophen (TYLENOL)  500 MG tablet, Take 500 mg by mouth every 6 (six) hours as needed for moderate pain or headache., Disp: , Rfl:  .  albuterol (PROVENTIL HFA;VENTOLIN HFA) 108 (90 Base) MCG/ACT inhaler, Inhale 2 puffs into the lungs every 6 (six) hours as needed for wheezing or shortness of breath., Disp: 1 Inhaler, Rfl: 6 .  donepezil (ARICEPT) 10 MG tablet, TAKE ONE TABLET AT BEDTIME (Patient taking differently: TAKE ONE TABLET (10 MG) BY MOUTH ONCE DAILY IN THE MORNING), Disp: 30 tablet, Rfl: 5 .  ferrous sulfate 325 (65 FE) MG tablet, Take 1 tablet (325 mg total) by mouth daily with breakfast., Disp: 30 tablet, Rfl: 4 .  fluticasone (FLONASE) 50 MCG/ACT nasal spray, Place 1 spray into both nostrils daily as  needed for allergies or rhinitis. (Patient taking differently: Place 1 spray into both nostrils at bedtime. ), Disp: 16 g, Rfl: 2 .  folic acid (FOLVITE) 1 MG tablet, TAKE 1 TABLET DAILY, Disp: 30 tablet, Rfl: 5 .  furosemide (LASIX) 40 MG tablet, TAKE 1 TABLET EVERY DAY, Disp: 30 tablet, Rfl: 1 .  gabapentin (NEURONTIN) 100 MG capsule, TAKE (2) CAPSULES TWICE DAILY., Disp: 120 capsule, Rfl: 3 .  isosorbide dinitrate (ISORDIL) 30 MG tablet, TAKE 1 TABLET ONCE A DAY, Disp: 30 tablet, Rfl: 5 .  metoprolol succinate (TOPROL-XL) 50 MG 24 hr tablet, TAKE 1 TABLET DAILY, Disp: 30 tablet, Rfl: 5 .  MYRBETRIQ 25 MG TB24 tablet, Take 25 mg by mouth daily. , Disp: , Rfl:  .  nitroGLYCERIN (NITROSTAT) 0.4 MG SL tablet, Place 1 tablet (0.4 mg total) under the tongue every 5 (five) minutes as needed for chest pain (up to 3 doses)., Disp: 25 tablet, Rfl: 3 .  pantoprazole (PROTONIX) 40 MG tablet, TAKE 1 TABLET DAILY, Disp: 90 tablet, Rfl: 1 .  sertraline (ZOLOFT) 50 MG tablet, TAKE 1 TABLET IN THE MORNING, Disp: 90 tablet, Rfl: 2 .  simvastatin (ZOCOR) 10 MG tablet, TAKE 1 TABLET DAILY (Patient taking differently: TAKE 1 TABLET (10 MG) BY MOUTH  DAILY AT BEDTIME), Disp: 30 tablet, Rfl: 5 .  Spacer/Aero Chamber Mouthpiece MISC, Use with Albuterol HFA inhaler as directed, Disp: 1 each, Rfl: 1 .  ULORIC 40 MG tablet, TAKE 1 TABLET DAILY, Disp: 30 tablet, Rfl: 6 .  warfarin (COUMADIN) 2 MG tablet, TAKE 1 TABLET ALONG WITH 2.5MG  TO EQUAL 4.5 MG DAILY, Disp: 30 tablet, Rfl: 5 .  warfarin (COUMADIN) 2.5 MG tablet, TAKE 1 TABLET DAILY WITH 2 MG AS DIRECTED BY CPP, Disp: 30 tablet, Rfl: 4 .  benzonatate (TESSALON) 200 MG capsule, Take 1 capsule (200 mg total) by mouth 2 (two) times daily as needed for cough., Disp: 20 capsule, Rfl: 0 .  COMBIVENT RESPIMAT 20-100 MCG/ACT AERS respimat, Inhale 1 puff into the lungs every 4 (four) hours as needed for cough, shortness of breath or wheezing., Disp: , Rfl: 0 .  predniSONE  (DELTASONE) 20 MG tablet, Take 2 tablets (40 mg total) by mouth daily with breakfast., Disp: 10 tablet, Rfl: 0  Social Hx: non smoker.  Health Maintenance: Flu shot needed  ROS: Per HPI  Objective: Office vital signs reviewed. BP (!) 146/76   Pulse 84   Temp 98.2 F (36.8 C) (Oral)   Resp 16   SpO2 95%   Physical Examination:  General: Awake, alert, elderly, chronically ill appearing male, No acute distress HEENT: Normal    Neck: No masses palpated. No JVD  Cardio: Distant heart sounds ; seemingly regular  rate and rhythm, no murmurs appreciated Pulm: Global expiratory wheeze that radiates to the upper airways. Normal work of breathing on room air. Intermittently coughing. No rhonchi or rales appreciated.  Dg Chest 2 View  Result Date: 01/27/2017 CLINICAL DATA:  One week of cough and wheezing. History of CHF, previous MI, aspiration pneumonia, former smoker. EXAM: CHEST  2 VIEW COMPARISON:  Chest x-ray of January 22, 2017 FINDINGS: The lungs are adequately inflated. The interstitial markings are increased. The cardiac silhouette is enlarged. The pulmonary vascularity is mildly engorged. There is calcification in the wall of the aortic arch. There is mild soft tissue fullness in the right paratracheal region which is stable. There is prominent thoracic kyphosis without definite compression fractures. IMPRESSION: CHF superimposed upon chronic bronchitic changes. Subsegmental atelectasis or early pneumonia at the left base cannot be excluded. No definite pleural effusions. Thoracic aortic atherosclerosis. Electronically Signed   By: David  Martinique M.D.   On: 01/27/2017 14:05    Assessment/ Plan: 83 y.o. male   1. Bronchospasm with bronchitis, acute He does have a history significant for CHF with an ejection fraction of 35-40% on most recent echo. There was no increased JVP on today's exam and no rales appreciated on pulmonary exam. He is afebrile and status post treatment with Z-Pak.  Expiratory wheeze moderately improved after duoneb in office. Therefore, we will treat him as bronchospasm in the setting of bronchitis. He has normal respiratory rate and pulse ox on room air. He has no known history of pulmonary disease. However, given persistent wheeze despite breathing treatment, will treat with oral steroid. Chest x-ray was obtained, upon personal review this was unchanged from previous. He does have a notably enlarged heart with chronic bronchitic changes. The left costophrenic angle is not well visualized, which is similar to chest x-ray obtained in August and October 2018.  Chest x-ray is unchanged.  I reviewed with the patient and his son how to use the Combivent inhaler. I recommended that he uses every 4 hours as directed for the next 48 hours. Then use as needed as directed. Prednisone 40 mg daily by 5 days was also prescribed. I refilled his Tessalon perles and recommended that he follow-up in the next week, especially if symptoms are not improving. Strict return precautions and reasons for emergent evaluation were reviewed with the patient. He'll follow-up with his primary care doctor as needed.  2. Wheeze - DG Chest 2 View; Future   Orders Placed This Encounter  Procedures  . DG Chest 2 View    Standing Status:   Future    Number of Occurrences:   1    Standing Expiration Date:   03/29/2018    Order Specific Question:   Reason for Exam (SYMPTOM  OR DIAGNOSIS REQUIRED)    Answer:   wheeze; h/o CHF    Order Specific Question:   Preferred imaging location?    Answer:   Internal    Order Specific Question:   Radiology Contrast Protocol - do NOT remove file path    Answer:   [email or web address]   Meds ordered this encounter  Medications  . DISCONTD: benzonatate (TESSALON) 200 MG capsule    Sig: Take 200 mg by mouth 2 (two) times daily.    Refill:  0  . benzonatate (TESSALON) 200 MG capsule    Sig: Take 1 capsule (200 mg total)  by mouth 2 (two) times daily as needed for cough.    Dispense:  20 capsule    Refill:  0  . COMBIVENT RESPIMAT 20-100 MCG/ACT AERS respimat    Sig: Inhale 1 puff into the lungs every 4 (four) hours as needed for cough, shortness of breath or wheezing.    Refill:  0  . predniSONE (DELTASONE) 20 MG tablet    Sig: Take 2 tablets (40 mg total) by mouth daily with breakfast.    Dispense:  10 tablet    Refill:  0     Ashly Windell Moulding, DO [place] [phone]

## 2017-01-27 NOTE — Patient Instructions (Addendum)
I have refilled your benzonatate for cough. You may use this twice a day as needed for cough.  You were given a DuoNeb treatment in office. This is a nebulized version of the inhaler you have prescribed by the urgent care.  To use your Combivent inhaler, exhale fully and then take a puff of the Combivent.     How to Use a Metered Dose Inhaler A metered dose inhaler is a handheld device for taking medicine that must be breathed into the lungs (inhaled). The device can be used to deliver a variety of inhaled medicines, including:  Quick relief or rescue medicines, such as bronchodilators.  Controller medicines, such as corticosteroids.  The medicine is delivered by pushing down on a metal canister to release a preset amount of spray and medicine. Each device contains the amount of medicine that is needed for a preset number of uses (inhalations). Your health care provider may recommend that you use a spacer with your inhaler to help you take the medicine more effectively. A spacer is a plastic tube with a mouthpiece on one end and an opening that connects to the inhaler on the other end. A spacer holds the medicine in a tube for a short time, which allows you to inhale more medicine. What are the risks? If you do not use your inhaler correctly, medicine might not reach your lungs to help you breathe. Inhaler medicine can cause side effects, such as:  Mouth or throat infection.  Cough.  Hoarseness.  Headache.  Nausea and vomiting.  Lung infection (pneumonia) in people who have a lung condition called COPD.  How to use a metered dose inhaler without a spacer 1. Remove the cap from the inhaler. 2. If you are using the inhaler for the first time, shake it for 5 seconds, turn it away from your face, then release 4 puffs into the air. This is called priming. 3. Shake the inhaler for 5 seconds. 4. Position the inhaler so the top of the canister faces up. 5. Put your index finger on the  top of the medicine canister. Support the bottom of the inhaler with your thumb. 6. Breathe out normally and as completely as possible, away from the inhaler. 7. Either place the inhaler between your teeth and close your lips tightly around the mouthpiece, or hold the inhaler 1-2 inches (2.5-5 cm) away from your open mouth. Keep your tongue down out of the way. If you are unsure which technique to use, ask your health care provider. 8. Press the canister down with your index finger to release the medicine, then inhale deeply and slowly through your mouth (not your nose) until your lungs are completely filled. Inhaling should take 4-6 seconds. 9. Hold the medicine in your lungs for 5-10 seconds (10 seconds is best). This helps the medicine get into the small airways of your lungs. 10. With your lips in a tight circle (pursed), breathe out slowly. 11. Repeat steps 3-10 until you have taken the number of puffs that your health care provider directed. Wait about 1 minute between puffs or as directed. 12. Put the cap on the inhaler. 13. If you are using a steroid inhaler, rinse your mouth with water, gargle, and spit out the water. Do not swallow the water. How to use a metered dose inhaler with a spacer 1. Remove the cap from the inhaler. 2. If you are using the inhaler for the first time, shake it for 5 seconds, turn it away from  your face, then release 4 puffs into the air. This is called priming. 3. Shake the inhaler for 5 seconds. 4. Place the open end of the spacer onto the inhaler mouthpiece. 5. Position the inhaler so the top of the canister faces up and the spacer mouthpiece faces you. 6. Put your index finger on the top of the medicine canister. Support the bottom of the inhaler and the spacer with your thumb. 7. Breathe out normally and as completely as possible, away from the spacer. 8. Place the spacer between your teeth and close your lips tightly around it. Keep your tongue down out of the  way. 9. Press the canister down with your index finger to release the medicine, then inhale deeply and slowly through your mouth (not your nose) until your lungs are completely filled. Inhaling should take 4-6 seconds. 10. Hold the medicine in your lungs for 5-10 seconds (10 seconds is best). This helps the medicine get into the small airways of your lungs. 11. With your lips in a tight circle (pursed), breathe out slowly. 12. Repeat steps 3-11 until you have taken the number of puffs that your health care provider directed. Wait about 1 minute between puffs or as directed. 13. Remove the spacer from the inhaler and put the cap on the inhaler. 14. If you are using a steroid inhaler, rinse your mouth with water, gargle, and spit out the water. Do not swallow the water. Follow these instructions at home:  Take your inhaled medicine only as told by your health care provider. Do not use the inhaler more than directed by your health care provider.  Keep all follow-up visits as told by your health care provider. This is important.  If your inhaler has a counter, you can check it to determine how full your inhaler is. If your inhaler does not have a counter, ask your health care provider when you will need to refill your inhaler and write the refill date on a calendar or on your inhaler canister. Note that you cannot know when an inhaler is empty by shaking it.  Follow directions on the package insert for care and cleaning of your inhaler and spacer. Contact a health care provider if:  Symptoms are only partially relieved with your inhaler.  You are having trouble using your inhaler.  You have an increase in phlegm.  You have headaches. Get help right away if:  You feel little or no relief after using your inhaler.  You have dizziness.  You have a fast heart rate.  You have chills or a fever.  You have night sweats.  There is blood in your phlegm. Summary  A metered dose inhaler is a  handheld device for taking medicine that must be breathed into the lungs (inhaled).  The medicine is delivered by pushing down on a metal canister to release a preset amount of spray and medicine.  Each device contains the amount of medicine that is needed for a preset number of uses (inhalations). This information is not intended to replace advice given to you by your health care provider. Make sure you discuss any questions you have with your health care provider. Document Released: 03/29/2005 Document Revised: 02/17/2016 Document Reviewed: 02/17/2016 Elsevier Interactive Patient Education  2017 [employer].

## 2017-01-28 ENCOUNTER — Telehealth: Payer: Self-pay | Admitting: Family Medicine

## 2017-01-28 NOTE — Telephone Encounter (Signed)
Call to check in on patient.  Patient reports that his breathing and cough have improved since yesterday's visit.  He notes that he has been using the inhaler as directed and is responding well to this.  He had no questions or concerns at this time.  He voices great appreciation for the telephone call.  Quandarius Nill M. Lajuana Ripple, [facility]

## 2017-02-02 ENCOUNTER — Encounter: Payer: Self-pay | Admitting: Family Medicine

## 2017-02-08 ENCOUNTER — Ambulatory Visit (INDEPENDENT_AMBULATORY_CARE_PROVIDER_SITE_OTHER): Payer: Medicare Other

## 2017-02-08 ENCOUNTER — Encounter: Payer: Self-pay | Admitting: Family Medicine

## 2017-02-08 ENCOUNTER — Ambulatory Visit (INDEPENDENT_AMBULATORY_CARE_PROVIDER_SITE_OTHER): Payer: Medicare Other | Admitting: Family Medicine

## 2017-02-08 VITALS — BP 134/61 | HR 61 | Temp 96.9°F | Resp 15

## 2017-02-08 DIAGNOSIS — J181 Lobar pneumonia, unspecified organism: Secondary | ICD-10-CM | POA: Diagnosis not present

## 2017-02-08 DIAGNOSIS — R05 Cough: Secondary | ICD-10-CM

## 2017-02-08 DIAGNOSIS — R059 Cough, unspecified: Secondary | ICD-10-CM

## 2017-02-08 DIAGNOSIS — J189 Pneumonia, unspecified organism: Secondary | ICD-10-CM

## 2017-02-08 DIAGNOSIS — I5032 Chronic diastolic (congestive) heart failure: Secondary | ICD-10-CM

## 2017-02-08 MED ORDER — BENZONATATE 200 MG PO CAPS: 200.0000 mg | ORAL_CAPSULE | Freq: Two times a day (BID) | ORAL | 0 refills | Status: DC | PRN

## 2017-02-08 MED ORDER — LEVOFLOXACIN 750 MG PO TABS: 750.0000 mg | ORAL_TABLET | ORAL | 0 refills | Status: AC

## 2017-02-08 NOTE — Progress Notes (Signed)
Subjective: AV:[id number] cough PCP: Chipper Herb, MD Scott [name] is a 83 y.o. male presenting to clinic today for:  1. Persistent cough Patient was seen about 2 weeks ago for cough.  He had been seen at the urgent care several days prior and prescribed Tessalon Perles, Z-Pak and Combivent inhaler.  He had also been instructed to increase Lasix temporarily.  He has known CHF with an ejection fraction of 35-40%.  There was a question as to whether or not he was using the appropriately.  This was reviewed during that visit.  Patient was discharged with a refill of the [facility] and a prednisone burst.  He was called the following day to check in on his symptoms, which he noted had improved.  Today he reports for follow-up and states that cough has improved some but is persistent.  He endorses rhinorrhea.  He is accompanied to today's visit by one of his caretakers who reports that he does not appear fluid overloaded.  She does not recall him ever having a chronic cough.  She notes that the sputum has improved since last visit.  No fevers, chills.  He is tolerating oral intake normally.  Mentation is baseline.  Additionally, she reports compliance with his heart failure medications.  She is unsure if he is using his inhalers regularly when she is not on shift.  She does try to encourage him to use them when she is present.  Allergies  Allergen Reactions  . Lipitor [Atorvastatin] Other (See Comments)    unknown   Past Medical History:  Diagnosis Date  . Arthritis    "all over"  . Bladder cancer ([place]) 03/15/2012   pt denies this hx on 10/15/2014  . Bradycardia 11/28/2015   Severe-HR in the 20s to 30s following intubation for urological procedure.  Marland Kitchen CAD- non obstructive disease by cath 3/14   . Chronic lower back pain   . Chronic systolic CHF (congestive heart failure) ([place])    a. NICM - patent cors 06/2012, EF 40% at that time. b. 03/2013 eval: EF 20-25%.  . Chronic systolic heart  failure ([place]) 10/04/14   EF improved 50-55%  . DDD (degenerative disc disease)   . Depression   . DVT (deep venous thrombosis) ([place])    "I think in both legs"  . DVT, bilateral lower limbs ([place]) 03/2013   a. Bilat DVT dx 03/2013.  Marland Kitchen Elevated troponin    a. Adm 12/30-04/2013 - not felt to represent ACS; ?due to PE. b. Patent cors 06/2012.  Marland Kitchen ETOH abuse   . Frequency of urination   . Gangrene of toe ([place]) 10/03/2014  . Glaucoma   . HTN (hypertension)   . Hyperlipidemia   . Hypertension   . Nocturia   . NSVT (nonsustained ventricular tachycardia) ([place])    a. NSVT 06/2012; NSVT also seen during 03/2013 adm. b. Med rx. Not candidate for ICD given adv age.  Marland Kitchen PAD (peripheral artery disease) ([place])   . Pneumonia    "couple times"  . 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 Pulmonary embolism ([place])    a. By CT angio 04/2013.  Marland Kitchen PVCs (premature ventricular contractions)   . PVD (peripheral vascular disease) (H[facility])    Right ABI .75, Left .78 (2006)  . Sepsis due to other etiology ([place]) 10/03/2014  . Sleep apnea   . Syncope    a. Felt  to be postural syncope related to diuretics 06/2012.   Family History  Problem Relation Age of Onset  . Hypertension Mother   . Arthritis Mother   . Lung cancer Sister   . Deep vein thrombosis Father   . Early death Brother     Current Outpatient Prescriptions:  .  acetaminophen (TYLENOL) 500 MG tablet, Take 500 mg by mouth every 6 (six) hours as needed for moderate pain or headache., Disp: , Rfl:  .  albuterol (PROVENTIL HFA;VENTOLIN HFA) 108 (90 Base) MCG/ACT inhaler, Inhale 2 puffs into the lungs every 6 (six) hours as needed for wheezing or shortness of breath., Disp: 1 Inhaler, Rfl: 6 .  benzonatate (TESSALON) 200 MG capsule, Take 1 capsule (200 mg total) by mouth 2 (two) times daily as needed for cough., Disp: 20 capsule, Rfl: 0 .  COMBIVENT RESPIMAT 20-100  MCG/ACT AERS respimat, Inhale 1 puff into the lungs every 4 (four) hours as needed for cough, shortness of breath or wheezing., Disp: , Rfl: 0 .  donepezil (ARICEPT) 10 MG tablet, TAKE ONE TABLET AT BEDTIME (Patient taking differently: TAKE ONE TABLET (10 MG) BY MOUTH ONCE DAILY IN THE MORNING), Disp: 30 tablet, Rfl: 5 .  ferrous sulfate 325 (65 FE) MG tablet, Take 1 tablet (325 mg total) by mouth daily with breakfast., Disp: 30 tablet, Rfl: 4 .  fluticasone (FLONASE) 50 MCG/ACT nasal spray, Place 1 spray into both nostrils daily as needed for allergies or rhinitis. (Patient taking differently: Place 1 spray into both nostrils at bedtime. ), Disp: 16 g, Rfl: 2 .  folic acid (FOLVITE) 1 MG tablet, TAKE 1 TABLET DAILY, Disp: 30 tablet, Rfl: 5 .  furosemide (LASIX) 40 MG tablet, TAKE 1 TABLET EVERY DAY, Disp: 30 tablet, Rfl: 1 .  gabapentin (NEURONTIN) 100 MG capsule, TAKE (2) CAPSULES TWICE DAILY., Disp: 120 capsule, Rfl: 3 .  isosorbide dinitrate (ISORDIL) 30 MG tablet, TAKE 1 TABLET ONCE A DAY, Disp: 30 tablet, Rfl: 5 .  metoprolol succinate (TOPROL-XL) 50 MG 24 hr tablet, TAKE 1 TABLET DAILY, Disp: 30 tablet, Rfl: 5 .  MYRBETRIQ 25 MG TB24 tablet, Take 25 mg by mouth daily. , Disp: , Rfl:  .  nitroGLYCERIN (NITROSTAT) 0.4 MG SL tablet, Place 1 tablet (0.4 mg total) under the tongue every 5 (five) minutes as needed for chest pain (up to 3 doses)., Disp: 25 tablet, Rfl: 3 .  pantoprazole (PROTONIX) 40 MG tablet, TAKE 1 TABLET DAILY, Disp: 90 tablet, Rfl: 1 .  sertraline (ZOLOFT) 50 MG tablet, TAKE 1 TABLET IN THE MORNING, Disp: 90 tablet, Rfl: 2 .  simvastatin (ZOCOR) 10 MG tablet, TAKE 1 TABLET DAILY (Patient taking differently: TAKE 1 TABLET (10 MG) BY MOUTH  DAILY AT BEDTIME), Disp: 30 tablet, Rfl: 5 .  Spacer/Aero Chamber Mouthpiece MISC, Use with Albuterol HFA inhaler as directed, Disp: 1 each, Rfl: 1 .  ULORIC 40 MG tablet, TAKE 1 TABLET DAILY, Disp: 30 tablet, Rfl: 6 .  warfarin (COUMADIN) 2 MG  tablet, TAKE 1 TABLET ALONG WITH 2.5MG TO EQUAL 4.5 MG DAILY, Disp: 30 tablet, Rfl: 5 .  warfarin (COUMADIN) 2.5 MG tablet, TAKE 1 TABLET DAILY WITH 2 MG AS DIRECTED BY CPP, Disp: 30 tablet, Rfl: 4  Social Hx: never smoker. ROS: Per HPI  Objective: Office vital signs reviewed. BP 134/61   Pulse 61   Temp (!) 96.9 F (36.1 C) (Oral)   Resp 15   SpO2 93%   Physical Examination:  General: Awake,  alert, frail appearing elderly male, No acute distress HEENT: Normal, MMM; no JVD Cardio: regular rate and rhythm, S1S2 heard, no murmurs appreciated Pulm: mild global expiratory wheeze, no rales or rhonchi MSK:  Surgically absent LLE.  RLE with no edema.    Dg Chest 2 View  Result Date: 02/08/2017 CLINICAL DATA:  Cough and congestion EXAM: CHEST  2 VIEW COMPARISON:  January 27, 2017 and October 03, 2014 FINDINGS: There is patchy infiltrate in the left base. The lungs elsewhere are clear. Heart is borderline enlarged with pulmonary vascularity within normal limits. There is aortic atherosclerosis. Prominence in the right paratracheal region is likely due to great vessel prominence, stable. No evident adenopathy. There is degenerative change in the thoracic spine. There is anterior wedging at several levels in the thoracic region. IMPRESSION: Left lower lobe patchy airspace opacity felt to represent pneumonia. Lungs elsewhere clear. Stable cardiac silhouette. There is aortic atherosclerosis. Aortic Atherosclerosis (ICD10-I70.0). Followup PA and lateral chest radiographs recommended in 3-4 weeks following trial of antibiotic therapy to ensure resolution and exclude underlying malignancy. These results will be called to the ordering clinician or representative by the Radiologist Assistant, and communication documented in the PACS or zVision Dashboard. Electronically Signed   By: Lowella Grip III M.D.   On: 02/08/2017 11:50    Assessment/ Plan: 84 y.o. male   1. Pneumonia of left lower lobe due to  infectious organism ([place]) Patchy infiltrate in the left lung base.  This is concerning for pneumonia.  Patient is status post treatment with Z-Pak earlier this month.  EKG from last cardiology visit was reviewed.  QTC was not prolonged.  Will treat with Levaquin 750 mg every other day by 3 doses.  This has been renally adjusted for creatinine clearance of 15m /min based on weight obtained during urgent care visit.  There is a possibility that the fluoroquinolone can interact with the warfarin and increase the concentration of warfarin.  His last INR in September was subtherapeutic at 1.8.  I did recommend that patient follow-up within a week to check INR.  CBC, BMP and BNP were ordered today to rule out possible heart failure exacerbation.  Though, the chest x-ray did not show increased pulmonary vascular congestion.  Findings were reviewed with the patient's son.  I reviewed that the Levaquin should be taken every other day for 3 doses.  Tessalon was refilled to help with nighttime cough.  Patient to follow-up with PCP in 3 weeks for repeat chest x-ray.  Strict return precautions and reasons for emergent evaluation in the emergency department review with patient.  They voiced understanding and will follow-up as needed. - DG Chest 2 View; Future - CBC with Differential - BMP8+EGFR - Brain natriuretic peptide - Ambulatory referral to Pulmonology  2. Chronic diastolic heart failure, NYHA class 1 (HCC) No physical evidence of fluid overload.  Per patient's caretaker at home, he appears baseline with regards to fluid status. - BMP8+EGFR - Brain natriuretic peptide   Orders Placed This Encounter  Procedures  . DG Chest 2 View    Standing Status:   Future    Number of Occurrences:   1    Standing Expiration Date:   04/10/2018    Order Specific Question:   Reason for Exam (SYMPTOM  OR DIAGNOSIS REQUIRED)    Answer:   cough    Order Specific Question:   Preferred imaging location?    Answer:    Internal  . CBC with Differential  . BMP8+EGFR  .  Brain natriuretic peptide  . Ambulatory referral to Pulmonology    Referral Priority:   Routine    Referral Type:   Consultation    Referral Reason:   Specialty Services Required    Requested Specialty:   Pulmonary Disease    Number of Visits Requested:   1   Meds ordered this encounter  Medications  . levofloxacin (LEVAQUIN) 750 MG tablet    Sig: Take 1 tablet (750 mg total) by mouth every other day.    Dispense:  3 tablet    Refill:  0  . benzonatate (TESSALON) 200 MG capsule    Sig: Take 1 capsule (200 mg total) by mouth 2 (two) times daily as needed for cough.    Dispense:  20 capsule    Refill:  [place], DO [place] [phone]

## 2017-02-08 NOTE — Patient Instructions (Addendum)
I have ordered several labs today to evaluate for possible congestive heart failure exacerbation.  The chest x-ray appears fairly stable from previous.  However, I am still awaiting the radiologist's formal read.  I recommend that you continue the albuterol inhaler and Combivent inhaler as directed.  I have placed a referral to pulmonology also for further assessment.

## 2017-02-09 LAB — CBC WITH DIFFERENTIAL/PLATELET
Basophils Absolute: 0 10*3/uL (ref 0.0–0.2)
Basos: 0 %
EOS (ABSOLUTE): 0.1 10*3/uL (ref 0.0–0.4)
Eos: 1 %
Hematocrit: 42.2 % (ref 37.5–51.0)
Hemoglobin: 13.6 g/dL (ref 13.0–17.7)
Immature Grans (Abs): 0 10*3/uL (ref 0.0–0.1)
Immature Granulocytes: 0 %
Lymphocytes Absolute: 1.4 10*3/uL (ref 0.7–3.1)
Lymphs: 25 %
MCH: 28.1 pg (ref 26.6–33.0)
MCHC: 32.2 g/dL (ref 31.5–35.7)
MCV: 87 fL (ref 79–97)
Monocytes Absolute: 0.4 10*3/uL (ref 0.1–0.9)
Monocytes: 6 %
Neutrophils Absolute: 3.8 10*3/uL (ref 1.4–7.0)
Neutrophils: 68 %
Platelets: 171 10*3/uL (ref 150–379)
RBC: 4.84 x10E6/uL (ref 4.14–5.80)
RDW: 15.4 % (ref 12.3–15.4)
WBC: 5.6 10*3/uL (ref 3.4–10.8)

## 2017-02-09 LAB — BRAIN NATRIURETIC PEPTIDE: BNP: 405.6 pg/mL — ABNORMAL HIGH (ref 0.0–100.0)

## 2017-02-09 LAB — BMP8+EGFR
BUN/Creatinine Ratio: 16 (ref 10–24)
BUN: 18 mg/dL (ref 10–36)
CO2: 26 mmol/L (ref 20–29)
Calcium: 9.5 mg/dL (ref 8.6–10.2)
Chloride: 106 mmol/L (ref 96–106)
Creatinine, Ser: 1.1 mg/dL (ref 0.76–1.27)
GFR calc Af Amer: 67 mL/min/{1.73_m2} (ref >59)
GFR calc non Af Amer: 58 mL/min/{1.73_m2} — ABNORMAL LOW (ref >59)
Glucose: 135 mg/dL — ABNORMAL HIGH (ref 65–99)
Potassium: 3.8 mmol/L (ref 3.5–5.2)
Sodium: 144 mmol/L (ref 134–144)

## 2017-02-10 ENCOUNTER — Ambulatory Visit (INDEPENDENT_AMBULATORY_CARE_PROVIDER_SITE_OTHER): Payer: Medicare Other | Admitting: *Deleted

## 2017-02-10 DIAGNOSIS — I4891 Unspecified atrial fibrillation: Secondary | ICD-10-CM

## 2017-02-10 DIAGNOSIS — Z86711 Personal history of pulmonary embolism: Secondary | ICD-10-CM

## 2017-02-10 LAB — COAGUCHEK XS/INR WAIVED
INR: 3.5 — ABNORMAL HIGH (ref 0.9–1.1)
Prothrombin Time: 41.6 s

## 2017-02-10 NOTE — Patient Instructions (Signed)
Anticoagulation Warfarin Dose Instructions as of 02/10/2017      Scott [name] Tue Wed Thu Fri Sat   New Dose 4.5 mg 4.5 mg 4.5 mg 4.5 mg 0 mg 0 mg 4.5 mg    Description   Do not take coumadin today or tomorrow. Restart it on Saturday.   INR today is 3.5 which is too thin.   Goal is 2.0-3.0

## 2017-02-10 NOTE — Progress Notes (Signed)
Subjective:     Indication: atrial fibrillation Bleeding signs/symptoms: None Thromboembolic signs/symptoms: None  Missed Coumadin doses: This week - Missed Sunday and Tuesday's dose Medication changes: yes - Started Levaquin 750mg  on 02/08/17. Will complete on 02/12/17. Dietary changes: no Bacterial/viral infection: yes - pneumonia Other concerns: no  The following portions of the patient's history were reviewed and updated as appropriate: allergies and current medications.  Review of Systems Pertinent items are noted in HPI.   Objective:    INR Today: 3.5 Current dose: 4.5mg  daily    Assessment:    Supratherapeutic INR for goal of 2-3   Plan:    1. New dose: Hold today and tomorrow's dose. Restart normal dose of 4.5mg  daily on Saturday.    2. Next INR: 1 week    Discussed with Dr Laurance Flatten  Scott Sicilian, RN

## 2017-02-11 ENCOUNTER — Other Ambulatory Visit: Payer: Self-pay | Admitting: Family Medicine

## 2017-02-13 NOTE — Progress Notes (Signed)
Cardiology Office Note    Date:  02/15/2017   ID:  [name], DOB 1922/01/26, MRN [id number]  PCP:  Chipper Herb, MD  Cardiologist: Dr. Percival Spanish   Chief Complaint  Patient presents with  . Follow-up    worsening dyspnea, coughing    History of Present Illness:    Scott [name] is a 83 y.o. male with past medical history of NICM (EF previously 20-25% with cath in 2014 showing nonobstructive CAD), chronic combined systolic and diastolic CHF, known LBBB, postural syncope, PAF, PVD (s/p L AKA), prior PE/DVT (on Coumadin), HTN, and HLD who presents to the office today for 33-month follow-up.   He was recently evaluated by Dr. Percival Spanish on 01/18/2017 and reported baseline dyspnea but denied any acute worsening of his symptoms. A repeat echocardiogram was obtained and showed an improved EF of 35% to 40% with diffuse hypokinesis and incoordinate septal motion, with trivial AI, and moderate pulmonary hypertension with an RVSP of 62 mmHg.   He was evaluated by his PCP on 01/27/2017 and diagnosed with acute bronchitis, treated with steroids and a scheduled inhaler. He was evaluated on 02/08/2017 for worsening symptoms with CXR showing a LLL patchy airspace opacity, though to be most consistent with PNA. He was therefore started on Levaquin. BNP was also found to be elevated at 405, therefore his Lasix was increased from daily to BID for two days then instructed to resume his normal dosing with close Cardiology follow-up recommended.   In talking with the patient today, he reports improvement in his shortness of breath since completing antibiotic therapy. His caregiver who is with him today reports he is still having a significant cough which is worse upon lying down at night. The patient also notes his breathing is more labored at night and he is having to sleep at a higher angle than previously. He denies any recent chest discomfort, palpitations, dizziness, presyncope, PND, or lower extremity  edema.   He does not weight regularly at home as this is challenging in the setting of his AKA. The weight recorded today was per the patient's report. He uses a motorized wheelchair for ambulation.    Past Medical History:  Diagnosis Date  . Arthritis    "all over"  . Bladder cancer ([place]) 03/15/2012   pt denies this hx on 10/15/2014  . Bradycardia 11/28/2015   Severe-HR in the 20s to 30s following intubation for urological procedure.  Marland Kitchen CAD- non obstructive disease by cath 3/14   . Chronic lower back pain   . Chronic systolic CHF (congestive heart failure) ([place])    a. NICM - patent cors 06/2012, EF 40% at that time. b. 03/2013 eval: EF 20-25%.  . Chronic systolic heart failure ([place]) 10/04/14   EF improved 50-55%  . DDD (degenerative disc disease)   . Depression   . DVT (deep venous thrombosis) ([place])    "I think in both legs"  . DVT, bilateral lower limbs ([place]) 03/2013   a. Bilat DVT dx 03/2013.  Marland Kitchen Elevated troponin    a. Adm 12/30-04/2013 - not felt to represent ACS; ?due to PE. b. Patent cors 06/2012.  Marland Kitchen ETOH abuse   . Frequency of urination   . Gangrene of toe ([place]) 10/03/2014  . Glaucoma   . HTN (hypertension)   . Hyperlipidemia   . Hypertension   . Nocturia   . NSVT (nonsustained ventricular tachycardia) ([place])    a. NSVT 06/2012; NSVT also seen during 03/2013 adm. b.  Med rx. Not candidate for ICD given adv age.  Marland Kitchen PAD (peripheral artery disease) ([place])   . Pneumonia    "couple times"  . 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 Pulmonary embolism ([place])    a. By CT angio 04/2013.  Marland Kitchen PVCs (premature ventricular contractions)   . PVD (peripheral vascular disease) ([facility])    Right ABI .75, Left .78 (2006)  . Sepsis due to other etiology ([place]) 10/03/2014  . Sleep apnea   . Syncope    a. Felt to be postural syncope related to diuretics 06/2012.    Past Surgical History:  Procedure  Laterality Date  . CARDIAC CATHETERIZATION  05-11-2004  [facility]   MINIMAL CORANARY PLAQUE/ NORMAL LVF/ EF 55%/  NON-OBSTRUCTIVE LAD 25%  . CARDIAC CATHETERIZATION  06/2012  . CARDIOVASCULAR STRESS TEST  10-15-2010   LOW RISK NUCLEAR STUDY/ NO EVIDENCE OF ISCHEMIA/ NORMAL EF  . CATARACT EXTRACTION W/ INTRAOCULAR LENS  IMPLANT, BILATERAL Bilateral   . EYE SURGERY    . SHOULDER SURGERY  1970's   "don't remember which side or what kind of OR"  . TONSILLECTOMY    . TRANSTHORACIC ECHOCARDIOGRAM  09-22-2010   MODERATE LVH/ EF 34%/ GRADE I DIASTOLIC DYSFUNCTION/ AORTIC SCLEROSIS WITHOUT STENOSIS/  RV  SYSTOLIC  MILDLY REDUCED FUNCTION    Current Medications: Outpatient Medications Prior to Visit  Medication Sig Dispense Refill  . acetaminophen (TYLENOL) 500 MG tablet Take 500 mg by mouth every 6 (six) hours as needed for moderate pain or headache.    . albuterol (PROVENTIL HFA;VENTOLIN HFA) 108 (90 Base) MCG/ACT inhaler Inhale 2 puffs into the lungs every 6 (six) hours as needed for wheezing or shortness of breath. 1 Inhaler 6  . benzonatate (TESSALON) 200 MG capsule Take 1 capsule (200 mg total) by mouth 2 (two) times daily as needed for cough. 20 capsule 0  . COMBIVENT RESPIMAT 20-100 MCG/ACT AERS respimat Inhale 1 puff into the lungs every 4 (four) hours as needed for cough, shortness of breath or wheezing.  0  . donepezil (ARICEPT) 10 MG tablet TAKE ONE TABLET AT BEDTIME (Patient taking differently: TAKE ONE TABLET (10 MG) BY MOUTH ONCE DAILY IN THE MORNING) 30 tablet 5  . ferrous sulfate 325 (65 FE) MG tablet Take 1 tablet (325 mg total) by mouth daily with breakfast. 30 tablet 4  . fluticasone (FLONASE) 50 MCG/ACT nasal spray Place 1 spray into both nostrils daily as needed for allergies or rhinitis. (Patient taking differently: Place 1 spray into both nostrils at bedtime. ) 16 g 2  . folic acid (FOLVITE) 1 MG tablet TAKE 1 TABLET DAILY 30 tablet 5  . gabapentin (NEURONTIN) 100 MG capsule  TAKE (2) CAPSULES TWICE DAILY. 120 capsule 3  . isosorbide dinitrate (ISORDIL) 30 MG tablet TAKE 1 TABLET ONCE A DAY 30 tablet 5  . metoprolol succinate (TOPROL-XL) 50 MG 24 hr tablet TAKE 1 TABLET DAILY 30 tablet 5  . MYRBETRIQ 25 MG TB24 tablet Take 25 mg by mouth daily.     . nitroGLYCERIN (NITROSTAT) 0.4 MG SL tablet Place 1 tablet (0.4 mg total) under the tongue every 5 (five) minutes as needed for chest pain (up to 3 doses). 25 tablet 3  . pantoprazole (PROTONIX) 40 MG tablet TAKE 1 TABLET DAILY 90 tablet 1  . sertraline (ZOLOFT) 50 MG tablet TAKE 1 TABLET IN THE MORNING 90 tablet 2  .  simvastatin (ZOCOR) 10 MG tablet TAKE 1 TABLET DAILY 30 tablet 0  . Spacer/Aero Chamber [employer] Use with Albuterol HFA inhaler as directed 1 each 1  . ULORIC 40 MG tablet TAKE 1 TABLET DAILY 30 tablet 6  . warfarin (COUMADIN) 2 MG tablet TAKE 1 TABLET ALONG WITH 2.5MG  TO EQUAL 4.5 MG DAILY 30 tablet 5  . furosemide (LASIX) 40 MG tablet TAKE 1 TABLET EVERY DAY 30 tablet 1  . warfarin (COUMADIN) 2.5 MG tablet TAKE 1 TABLET DAILY WITH 2 MG AS DIRECTED BY CPP (Patient not taking: Reported on 02/15/2017) 30 tablet 4   No facility-administered medications prior to visit.      Allergies:   Lipitor [atorvastatin]   Social History   Socioeconomic History  . Marital status: Married    Spouse name: None  . Number of children: 1  . Years of education: None  . Highest education level: None  Social Needs  . Financial resource strain: None  . Food insecurity - worry: None  . Food insecurity - inability: None  . Transportation needs - medical: None  . Transportation needs - non-medical: None  Occupational History  . Occupation: Retired  Tobacco Use  . Smoking status: Former Smoker    Packs/day: 1.00    Years: 20.00    Pack years: 20.00    Types: Cigarettes    Start date: 04/12/1942    Last attempt to quit: 04/13/1975    Years since quitting: 41.8  . Smokeless tobacco: Never Used  Substance and  Sexual Activity  . Alcohol use: None    Comment: 10/09/2013 "reduced drinking in early June 2015"/son states has 5 beers week  . Drug use: No  . Sexual activity: No  Other Topics Concern  . None  Social History Narrative   Lives in [place] with spouse who is s/p spinal cord injury.  He has one grown son who lives in [place].  He is retired but worked for [employer] for years and lives in [place].     Family History:  The patient's family history includes Arthritis in his mother; Deep vein thrombosis in his father; Early death in his brother; Hypertension in his mother; Lung cancer in his sister.   Review of Systems:   Please see the history of present illness.     General:  No chills, fever, night sweats or weight changes.  Cardiovascular:  No chest pain, dyspnea on exertion, edema, orthopnea, palpitations, paroxysmal nocturnal dyspnea. Dermatological: No rash, lesions/masses Respiratory: Positive for cough and dyspnea Urologic: No hematuria, dysuria Abdominal:   No nausea, vomiting, diarrhea, bright red blood per rectum, melena, or hematemesis Neurologic:  No visual changes, wkns, changes in mental status.  All other systems reviewed and are otherwise negative except as noted above.   Physical Exam:    VS:  BP 126/65   Pulse 60   Ht 5' 6.5" (1.689 m)   Wt 160 lb (72.6 kg)   SpO2 95%   BMI 25.44 kg/m    General: Well developed, elderly African American male appearing in no acute distress. Head: Normocephalic, atraumatic, sclera non-icteric, no xanthomas, nares are without discharge.  Neck: No carotid bruits. JVD not elevated.  Lungs: Respirations regular and unlabored, rales along right base.  Heart: Regular rate and rhythm. No S3 or S4.  No murmur, no rubs, or gallops appreciated. Abdomen: Soft, non-tender, non-distended with normoactive bowel sounds. No hepatomegaly. No rebound/guarding. No obvious abdominal masses. Msk:  Strength and tone appear  normal for age. No  joint deformities or effusions. Extremities: No clubbing or cyanosis. No lower extremity edema.  Left AKA.  Neuro: Alert and oriented X 3. Moves all extremities spontaneously. No focal deficits noted. Psych:  Responds to questions appropriately with a normal affect. Skin: No rashes or lesions noted  Wt Readings from Last 3 Encounters:  02/15/17 160 lb (72.6 kg)  12/09/16 169 lb (76.7 kg)  12/03/16 160 lb (72.6 kg)     Studies/Labs Reviewed:   EKG:  EKG is not ordered today.    Recent Labs: 08/31/2016: ALT 12 02/08/2017: BNP 405.6; BUN 18; Creatinine, Ser 1.10; Hemoglobin 13.6; Platelets 171; Potassium 3.8; Sodium 144   Lipid Panel    Component Value Date/Time   CHOL 171 08/03/2016 1118   TRIG 151 (H) 08/03/2016 1118   TRIG 194 (H) 03/26/2014 1446   HDL 41 08/03/2016 1118   HDL 35 (L) 03/26/2014 1446   CHOLHDL 4.2 08/03/2016 1118   CHOLHDL 2.6 11/02/2012 1310   VLDL 44 (H) 11/02/2012 1310   LDLCALC 100 (H) 08/03/2016 1118   LDLCALC 42 05/17/2013 1101    Additional studies/ records that were reviewed today include:   Echocardiogram: 01/18/2017 Study Conclusions  - Left ventricle: The cavity size was normal. Wall thickness was   increased in a pattern of moderate LVH. Systolic function was   moderately reduced. The estimated ejection fraction was in the   range of 35% to 40%. Diffuse hypokinesis. Incoordinate septal   motion. The study is not technically sufficient to allow   evaluation of LV diastolic function. - Aortic valve: Mildly calcified valve. No stenosis. There was   trivial regurgitation. - Mitral valve: Calcified annulus. Mildly thickened leaflets .   There was trivial regurgitation. - Left atrium: The atrium was normal in size. - Right ventricle: The cavity size was normal. Wall thickness was   normal. Systolic function is mildly reduced. - Tricuspid valve: There was mild regurgitation. - Pulmonary arteries: PA peak pressure: 62 mm Hg (S). - Inferior  vena cava: The vessel was dilated. The respirophasic   diameter changes were blunted (< 50%), consistent with elevated   central venous pressure.  Impressions:  - Compared to a prior study in 2016, the LVEF is lower at 35-40%   with global hypokinesis and incoordinate septal motion. There is   moderate pulmonary hypertension with an RVSP of 62 mmHg and   dilated IVC.   Assessment:    1. Chronic combined systolic and diastolic heart failure ([place])   2. NICM (nonischemic cardiomyopathy)   3. PAF (paroxysmal atrial fibrillation) ([place])   4. PVD (peripheral vascular disease) ([place])   5. Essential hypertension      Plan:   In order of problems listed above:  1. Chronic Combined Systolic and Diastolic CHF/ Nonischemic Cardiomyoapthy - EF previously 20-25% with cath in 2014 showing nonobstructive CAD. Repeat echocardiogram in 01/2017 showed an improved EF of 35-40% with diffuse HK.  - he was recently diagnosed with PNA by his PCP and started on Levaquin. BNP was also found to be elevated at 405, therefore his Lasix was increased from daily to BID for two days then instructed to resume his normal dosing. - he reports continued orthopnea and a dry cough. He does have rales on examination. Unable to determine if weight gain has occurred as the patient is unable to stand for a weight during today's visit. With his concerning symptoms, will increase Lasix to 40mg  BID for 5 days then  resume 40mg  daily.  - continue BB and Imdur. Not on ACE-I/ARB/ARNI secondary to history of hypotension.    2. Paroxysmal Atrial Fibrillation - he denies any recent palpitations. Remains on Toprol-XL 50mg  daily for rate-control.  - This patients CHA2DS2-VASc Score and unadjusted Ischemic Stroke Rate (% per year) is equal to 11.2 % stroke rate/year from a score of 7 (CHF, HTN, Vascular, PE (2), Age (2)). He remains on Coumadin for anticoagulation. INR followed by PCP.   3. PVD - s/p L AKA.   4. HTN - BP is  well-controlled at 126/65 during today's visit.  - continue current medication regimen.    Medication Adjustments/Labs and Tests Ordered: Current medicines are reviewed at length with the patient today.  Concerns regarding medicines are outlined above.  Medication changes, Labs and Tests ordered today are listed in the Patient Instructions below. Patient Instructions  Medication Instructions: Your physician has recommended you make the following change in your medication:  -1) INCREASE LASIX - Take 1 tablet (40 mg) by mouth TWICE daily for 5 DAYS, then resume normal dose of 1 tablet (40 mg) daily   Labwork: None Ordered  Procedures/Testing: None Ordered  Follow-Up: Your physician recommends that you schedule a follow-up appointment in: 2-3 MONTHS with Dr. Percival Spanish in [place]  If you need a refill on your cardiac medications before your next appointment, please call your pharmacy.    Signed, Erma Heritage, PA-C  02/15/2017 6:29 PM    [place], [address], [place]  [address] Phone: [phone]; Fax: [phone]  [address], [place], [place] [address] Phone: [phone]

## 2017-02-15 ENCOUNTER — Encounter: Payer: Self-pay | Admitting: Student

## 2017-02-15 ENCOUNTER — Ambulatory Visit (INDEPENDENT_AMBULATORY_CARE_PROVIDER_SITE_OTHER): Payer: Medicare Other | Admitting: Student

## 2017-02-15 VITALS — BP 126/65 | HR 60 | Ht 66.5 in | Wt 160.0 lb

## 2017-02-15 DIAGNOSIS — I5042 Chronic combined systolic (congestive) and diastolic (congestive) heart failure: Secondary | ICD-10-CM | POA: Diagnosis not present

## 2017-02-15 DIAGNOSIS — I480 Paroxysmal atrial fibrillation: Secondary | ICD-10-CM

## 2017-02-15 DIAGNOSIS — I10 Essential (primary) hypertension: Secondary | ICD-10-CM

## 2017-02-15 DIAGNOSIS — I739 Peripheral vascular disease, unspecified: Secondary | ICD-10-CM

## 2017-02-15 DIAGNOSIS — I428 Other cardiomyopathies: Secondary | ICD-10-CM | POA: Diagnosis not present

## 2017-02-15 MED ORDER — FUROSEMIDE 40 MG PO TABS: 40.0000 mg | ORAL_TABLET | Freq: Every day | ORAL | 1 refills | Status: DC

## 2017-02-15 MED ORDER — FUROSEMIDE 40 MG PO TABS: ORAL_TABLET | ORAL | 1 refills | Status: DC

## 2017-02-15 NOTE — Patient Instructions (Addendum)
Medication Instructions: Your physician has recommended you make the following change in your medication:  -1) INCREASE LASIX - Take 1 tablet (40 mg) by mouth TWICE daily for 5 DAYS, then resume normal dose of 1 tablet (40 mg) daily   Labwork: None Ordered  Procedures/Testing: None Ordered  Follow-Up: Your physician recommends that you schedule a follow-up appointment in: 2-3 MONTHS with Dr. Percival Spanish in [place]   If you need a refill on your cardiac medications before your next appointment, please call your pharmacy.

## 2017-02-18 ENCOUNTER — Ambulatory Visit (INDEPENDENT_AMBULATORY_CARE_PROVIDER_SITE_OTHER): Payer: Medicare Other | Admitting: Pharmacist Clinician (PhC)/ Clinical Pharmacy Specialist

## 2017-02-18 DIAGNOSIS — Z86711 Personal history of pulmonary embolism: Secondary | ICD-10-CM

## 2017-02-18 DIAGNOSIS — I4891 Unspecified atrial fibrillation: Secondary | ICD-10-CM | POA: Diagnosis not present

## 2017-02-18 LAB — COAGUCHEK XS/INR WAIVED
INR: 3.3 — ABNORMAL HIGH (ref 0.9–1.1)
Prothrombin Time: 39.7 s

## 2017-02-18 NOTE — Patient Instructions (Signed)
Description   No coumadin today then follow these directions:  4.5mg  a day except for Saturdays and Tuesdays only take 2.5mg   INR today is 3.3 today- a little thin.   Goal is 2.0-3.0

## 2017-02-21 ENCOUNTER — Other Ambulatory Visit: Payer: Self-pay | Admitting: Family Medicine

## 2017-02-22 DIAGNOSIS — I70203 Unspecified atherosclerosis of native arteries of extremities, bilateral legs: Secondary | ICD-10-CM | POA: Diagnosis not present

## 2017-02-22 DIAGNOSIS — M79676 Pain in unspecified toe(s): Secondary | ICD-10-CM | POA: Diagnosis not present

## 2017-02-22 DIAGNOSIS — B351 Tinea unguium: Secondary | ICD-10-CM | POA: Diagnosis not present

## 2017-03-09 ENCOUNTER — Other Ambulatory Visit: Payer: Self-pay | Admitting: Family Medicine

## 2017-03-11 ENCOUNTER — Ambulatory Visit (INDEPENDENT_AMBULATORY_CARE_PROVIDER_SITE_OTHER): Payer: Medicare Other | Admitting: Pharmacist Clinician (PhC)/ Clinical Pharmacy Specialist

## 2017-03-11 DIAGNOSIS — I4891 Unspecified atrial fibrillation: Secondary | ICD-10-CM

## 2017-03-11 DIAGNOSIS — Z86711 Personal history of pulmonary embolism: Secondary | ICD-10-CM

## 2017-03-11 LAB — COAGUCHEK XS/INR WAIVED
INR: 4 — ABNORMAL HIGH (ref 0.9–1.1)
Prothrombin Time: 48.1 s

## 2017-03-11 NOTE — Patient Instructions (Addendum)
Description    INR today is 4.0  Too thin today.  No coumadin today and then take only 2.5mg  every day  Goal is 2.0-3.0

## 2017-03-15 ENCOUNTER — Other Ambulatory Visit: Payer: Self-pay | Admitting: Family Medicine

## 2017-03-25 ENCOUNTER — Ambulatory Visit (INDEPENDENT_AMBULATORY_CARE_PROVIDER_SITE_OTHER): Payer: Medicare Other | Admitting: Pharmacist Clinician (PhC)/ Clinical Pharmacy Specialist

## 2017-03-25 DIAGNOSIS — I4891 Unspecified atrial fibrillation: Secondary | ICD-10-CM

## 2017-03-25 DIAGNOSIS — Z86711 Personal history of pulmonary embolism: Secondary | ICD-10-CM

## 2017-03-25 DIAGNOSIS — Z23 Encounter for immunization: Secondary | ICD-10-CM | POA: Diagnosis not present

## 2017-03-25 LAB — COAGUCHEK XS/INR WAIVED
INR: 1.6 — ABNORMAL HIGH (ref 0.9–1.1)
Prothrombin Time: 19.6 s

## 2017-03-25 NOTE — Patient Instructions (Signed)
Description    Change to taking 5mg  Mondays and Fridays and 2.5mg  all other days of the week  INR today is 1.6 (goal 2-3)

## 2017-03-28 DIAGNOSIS — Z86711 Personal history of pulmonary embolism: Secondary | ICD-10-CM | POA: Diagnosis not present

## 2017-03-28 DIAGNOSIS — I4891 Unspecified atrial fibrillation: Secondary | ICD-10-CM | POA: Diagnosis not present

## 2017-03-28 DIAGNOSIS — Z23 Encounter for immunization: Secondary | ICD-10-CM | POA: Diagnosis not present

## 2017-03-30 ENCOUNTER — Telehealth: Payer: Self-pay | Admitting: Family Medicine

## 2017-03-30 DIAGNOSIS — R410 Disorientation, unspecified: Secondary | ICD-10-CM

## 2017-03-30 DIAGNOSIS — R443 Hallucinations, unspecified: Secondary | ICD-10-CM

## 2017-03-30 NOTE — Telephone Encounter (Signed)
Labs will be drawn when he has Protime visit here on 04/15/17

## 2017-03-30 NOTE — Telephone Encounter (Signed)
Caregiver aware to bring in a urine  (will be tomorrow)  Scott [name], - can you contact his [facility] nurse and have her do the BMP and CBC at her next visit with him?

## 2017-03-30 NOTE — Telephone Encounter (Signed)
Pt started acting confused and seeing stuff that's not there. Pt urinating frequently. Denies fever or blood. Urine is dark and has strong odor. Offered appt, but they could not bring him. They are wanting to know if he can drop of urine.

## 2017-03-30 NOTE — Telephone Encounter (Signed)
S, get a urinalysis and make sure that it is a clean catch midstream specimen and see if you can get home health to do a CBC on him and a BMP.

## 2017-03-31 ENCOUNTER — Other Ambulatory Visit: Payer: Self-pay | Admitting: Nurse Practitioner

## 2017-03-31 ENCOUNTER — Other Ambulatory Visit: Payer: Medicare Other

## 2017-03-31 DIAGNOSIS — R443 Hallucinations, unspecified: Secondary | ICD-10-CM | POA: Diagnosis not present

## 2017-03-31 DIAGNOSIS — R410 Disorientation, unspecified: Secondary | ICD-10-CM

## 2017-03-31 LAB — URINALYSIS, COMPLETE
Bilirubin, UA: NEGATIVE
Glucose, UA: NEGATIVE
Ketones, UA: NEGATIVE
Nitrite, UA: NEGATIVE
Specific Gravity, UA: 1.015 (ref 1.005–1.030)
Urobilinogen, Ur: 0.2 mg/dL (ref 0.2–1.0)
pH, UA: 7.5 (ref 5.0–7.5)

## 2017-03-31 LAB — MICROSCOPIC EXAMINATION: Renal Epithel, UA: NONE SEEN /hpf

## 2017-03-31 MED ORDER — CEPHALEXIN 500 MG PO CAPS: 500.0000 mg | ORAL_CAPSULE | Freq: Two times a day (BID) | ORAL | 0 refills | Status: DC

## 2017-03-31 NOTE — Progress Notes (Signed)
Sent in keflex rx for UTI

## 2017-04-01 LAB — URINE CULTURE

## 2017-04-07 ENCOUNTER — Telehealth: Payer: Self-pay | Admitting: Cardiology

## 2017-04-07 MED ORDER — FUROSEMIDE 40 MG PO TABS: 40.0000 mg | ORAL_TABLET | Freq: Every day | ORAL | 2 refills | Status: DC

## 2017-04-07 NOTE — Telephone Encounter (Signed)
Pt of Dr. Percival Spanish  Returned call to son.  He voices concern that patient has been "wheezing" for 1 week. Noted by home health aides. No noted increase in respiratory effort. Denies cough or fever, URI symptoms. No other concerns or symptoms noted by assessment. Pt is currently on antibiotics for treatment of a UTI.  Pt has limited mobility (hx spinal cord injury, s/p left AKA) so son states he is not able to get daily weights - unsure of weight trends. States "may" have more fluid around belly but difficult to tell. He was adamant we advise on what to do but shared his frustration in trying to get patient to in-office appointments.  Pt currently taking 40mg  lasix daily. I recommended adding extra 40mg  lasix this afternoon and tomorrow afternoon & to monitor wheezing & respiratory effort, call back tomorrow if no noted improvement.  Pt has appt w Dr. Percival Spanish in February, son aware he may need sooner f/u but that we would try to help as much as we're able to help him manage at home.  Informed him I would route to MD for advice & f/u w him.

## 2017-04-07 NOTE — Telephone Encounter (Signed)
Left msg to call.

## 2017-04-07 NOTE — Telephone Encounter (Signed)
Needs to know if pt fluid medicine was increased his last office visit?If so,pt have not been taking the increased dose.

## 2017-04-07 NOTE — Telephone Encounter (Signed)
Recommendations discussed with patient's son, who verbalized understanding and thanks. Spoke w pharmacist while on 3 way line w patient's son and confirmed plan, he verbalized understanding & will add "a couple extra tablets" to the bottle. Son will call tomorrow afternoon if no improvement of symptoms to get further advice.

## 2017-04-07 NOTE — Telephone Encounter (Signed)
F/u Message   pt returning call

## 2017-04-07 NOTE — Telephone Encounter (Signed)
Line busy when dialed. 

## 2017-04-07 NOTE — Telephone Encounter (Signed)
Agree with plan Scott [name]  

## 2017-04-08 ENCOUNTER — Ambulatory Visit (INDEPENDENT_AMBULATORY_CARE_PROVIDER_SITE_OTHER): Payer: Medicare Other | Admitting: Urology

## 2017-04-08 ENCOUNTER — Other Ambulatory Visit (HOSPITAL_COMMUNITY)
Admission: RE | Admit: 2017-04-08 | Discharge: 2017-04-08 | Disposition: A | Discharge status: Home / Self Care | Payer: Medicare Other | Source: Ambulatory Visit | Attending: Urology | Admitting: Urology

## 2017-04-08 DIAGNOSIS — Z8551 Personal history of malignant neoplasm of bladder: Secondary | ICD-10-CM

## 2017-04-08 DIAGNOSIS — N390 Urinary tract infection, site not specified: Secondary | ICD-10-CM | POA: Diagnosis not present

## 2017-04-08 DIAGNOSIS — Z87440 Personal history of urinary (tract) infections: Secondary | ICD-10-CM | POA: Diagnosis not present

## 2017-04-08 LAB — URINALYSIS, COMPLETE (UACMP) WITH MICROSCOPIC
Bacteria, UA: NONE SEEN
Bilirubin Urine: NEGATIVE
Glucose, UA: NEGATIVE mg/dL
Hgb urine dipstick: NEGATIVE
Ketones, ur: NEGATIVE mg/dL
Leukocytes, UA: NEGATIVE
Nitrite: NEGATIVE
Protein, ur: NEGATIVE mg/dL
RBC / HPF: NONE SEEN RBC/hpf (ref 0–5)
Specific Gravity, Urine: 1.009 (ref 1.005–1.030)
pH: 5 (ref 5.0–8.0)

## 2017-04-09 LAB — URINE CULTURE: Culture: NO GROWTH

## 2017-04-13 ENCOUNTER — Telehealth: Payer: Self-pay | Admitting: Family Medicine

## 2017-04-13 NOTE — Telephone Encounter (Signed)
Appointment given for tomorrow @ 11:00am. Caretaker aware we will also do protime at visit.

## 2017-04-14 ENCOUNTER — Other Ambulatory Visit: Payer: Self-pay | Admitting: Family Medicine

## 2017-04-14 ENCOUNTER — Ambulatory Visit (INDEPENDENT_AMBULATORY_CARE_PROVIDER_SITE_OTHER): Payer: Medicare Other | Admitting: Nurse Practitioner

## 2017-04-14 ENCOUNTER — Encounter: Payer: Self-pay | Admitting: Nurse Practitioner

## 2017-04-14 ENCOUNTER — Ambulatory Visit (INDEPENDENT_AMBULATORY_CARE_PROVIDER_SITE_OTHER): Payer: Medicare Other

## 2017-04-14 VITALS — BP 174/91 | HR 63 | Temp 96.8°F

## 2017-04-14 DIAGNOSIS — R0602 Shortness of breath: Secondary | ICD-10-CM

## 2017-04-14 DIAGNOSIS — I5042 Chronic combined systolic (congestive) and diastolic (congestive) heart failure: Secondary | ICD-10-CM | POA: Diagnosis not present

## 2017-04-14 DIAGNOSIS — I4891 Unspecified atrial fibrillation: Secondary | ICD-10-CM | POA: Diagnosis not present

## 2017-04-14 LAB — CBC WITH DIFFERENTIAL/PLATELET
Basophils Absolute: 0 10*3/uL (ref 0.0–0.2)
Basos: 0 %
EOS (ABSOLUTE): 0 10*3/uL (ref 0.0–0.4)
Eos: 1 %
Hematocrit: 42.5 % (ref 37.5–51.0)
Hemoglobin: 14.4 g/dL (ref 13.0–17.7)
Immature Grans (Abs): 0 10*3/uL (ref 0.0–0.1)
Immature Granulocytes: 0 %
Lymphocytes Absolute: 1.5 10*3/uL (ref 0.7–3.1)
Lymphs: 36 %
MCH: 28.2 pg (ref 26.6–33.0)
MCHC: 33.9 g/dL (ref 31.5–35.7)
MCV: 83 fL (ref 79–97)
Monocytes Absolute: 0.3 10*3/uL (ref 0.1–0.9)
Monocytes: 8 %
Neutrophils Absolute: 2.4 10*3/uL (ref 1.4–7.0)
Neutrophils: 55 %
Platelets: 175 10*3/uL (ref 150–379)
RBC: 5.11 x10E6/uL (ref 4.14–5.80)
RDW: 15.7 % — ABNORMAL HIGH (ref 12.3–15.4)
WBC: 4.2 10*3/uL (ref 3.4–10.8)

## 2017-04-14 LAB — COAGUCHEK XS/INR WAIVED
INR: 2.5 — ABNORMAL HIGH (ref 0.9–1.1)
Prothrombin Time: 30.4 s

## 2017-04-14 MED ORDER — FUROSEMIDE 40 MG PO TABS: 40.0000 mg | ORAL_TABLET | Freq: Every day | ORAL | 2 refills | Status: DC

## 2017-04-14 NOTE — Patient Instructions (Signed)
Heart Failure °Heart failure means your heart has trouble pumping blood. This makes it hard for your body to work well. Heart failure is usually a long-term (chronic) condition. You must take good care of yourself and follow your doctor's treatment plan. °Follow these instructions at home: °· Take your heart medicine as told by your doctor. °? Do not stop taking medicine unless your doctor tells you to. °? Do not skip any dose of medicine. °? Refill your medicines before they run out. °? Take other medicines only as told by your doctor or pharmacist. °· Stay active if told by your doctor. The elderly and people with severe heart failure should talk with a doctor about physical activity. °· Eat heart-healthy foods. Choose foods that are without trans fat and are low in saturated fat, cholesterol, and salt (sodium). This includes fresh or frozen fruits and vegetables, fish, lean meats, fat-free or low-fat dairy foods, whole grains, and high-fiber foods. Lentils and dried peas and beans (legumes) are also good choices. °· Limit salt if told by your doctor. °· Cook in a healthy way. Roast, grill, broil, bake, poach, steam, or stir-fry foods. °· Limit fluids as told by your doctor. °· Weigh yourself every morning. Do this after you pee (urinate) and before you eat breakfast. Write down your weight to give to your doctor. °· Take your blood pressure and write it down if your doctor tells you to. °· Ask your doctor how to check your pulse. Check your pulse as told. °· Lose weight if told by your doctor. °· Stop smoking or chewing tobacco. Do not use gum or patches that help you quit without your doctor's approval. °· Schedule and go to doctor visits as told. °· Nonpregnant women should have no more than 1 drink a day. Men should have no more than 2 drinks a day. Talk to your doctor about drinking alcohol. °· Stop illegal drug use. °· Stay current with shots (immunizations). °· Manage your health conditions as told by your  doctor. °· Learn to manage your stress. °· Rest when you are tired. °· If it is really hot outside: °? Avoid intense activities. °? Use air conditioning or fans, or get in a cooler place. °? Avoid caffeine and alcohol. °? Wear loose-fitting, lightweight, and light-colored clothing. °· If it is really cold outside: °? Avoid intense activities. °? Layer your clothing. °? Wear mittens or gloves, a hat, and a scarf when going outside. °? Avoid alcohol. °· Learn about heart failure and get support as needed. °· Get help to maintain or improve your quality of life and your ability to care for yourself as needed. °Contact a doctor if: °· You gain weight quickly. °· You are more short of breath than usual. °· You cannot do your normal activities. °· You tire easily. °· You cough more than normal, especially with activity. °· You have any or more puffiness (swelling) in areas such as your hands, feet, ankles, or belly (abdomen). °· You cannot sleep because it is hard to breathe. °· You feel like your heart is beating fast (palpitations). °· You get dizzy or light-headed when you stand up. °Get help right away if: °· You have trouble breathing. °· There is a change in mental status, such as becoming less alert or not being able to focus. °· You have chest pain or discomfort. °· You faint. °This information is not intended to replace advice given to you by your health care provider. Make sure you   discuss any questions you have with your health care provider. °Document Released: 01/06/2008 Document Revised: 09/04/2015 Document Reviewed: 05/15/2012 °Elsevier Interactive Patient Education © 2017 [employer] ° °

## 2017-04-14 NOTE — Progress Notes (Signed)
   Subjective:    Patient ID: Scott [name], male    DOB: 29-Aug-1921, 84 y.o.   MRN: [id number]  HPI  Patient brought in his caregiver with: - c/o SOB- has had for several months. Saw cardiology in November and he was given some extra fluid pills but was not permanent. He was not guven extra pills over the weekend. Any movement pr activity causes SOB.  -  INR today Indication: atrial fibrillation Bleeding signs/symptoms: None Thromboembolic signs/symptoms: None  Missed Coumadin doses: None Medication changes: no Dietary changes: no Bacterial/viral infection: yes - had uti 2 weeks ago Other concerns: yes - see above   * son says that he has been drinking liquor. Not sure who has been purchasing it for him but son has found bottles lately and thinks that is related to some of his fatigue and hallucinations. * was treated for UTI last week- no dysuria frequency or urgency  Review of Systems  Constitutional: Positive for appetite change (decreased) and fatigue. Negative for chills and fever.  HENT: Negative.   Respiratory: Positive for shortness of breath.   Cardiovascular: Negative.   Gastrointestinal: Negative.   Genitourinary: Negative.   Neurological: Negative.   Psychiatric/Behavioral: Negative.   All other systems reviewed and are negative.      Objective:   Physical Exam  Constitutional: He is oriented to person, place, and time. He appears well-developed and well-nourished. No distress.  Cardiovascular: Normal rate.  Pulmonary/Chest: Effort normal. He has rales (bilateral lower lobes).  Musculoskeletal: He exhibits edema (1+edema right lower leg).  AKA of left leg  Neurological: He is alert and oriented to person, place, and time.  Skin: Skin is warm.  Psychiatric: He has a normal mood and affect. His behavior is normal. Judgment and thought content normal.     BP (!) 174/91   Pulse 63   Temp (!) 96.8 F (36 C) (Oral)   SpO2 90%  INR 2.5  chest x ray- right  lower lobe effusion-Preliminary reading by Ronnald Collum, FNP  [facility]      Assessment & Plan:  1. Atrial fibrillation with RVR (HCC) Continue current coumadin dose - CoaguChek XS/INR Waived - CBC with Differential/Platelet  2. SOB (shortness of breath) - Culture, Group A Strep - DG Chest 2 View; Future - Brain natriuretic peptide - CMP14+EGFR - CBC with Differential/Platelet  3. Chronic combined systolic and diastolic heart failure (HCC) Increase lasix to bid - furosemide (LASIX) 40 MG tablet; Take 1 tablet (40 mg total) by mouth daily.  Dispense: 60 tablet; Refill: 2  Discussed concerning of liqour being harmful to him  Mary-Margaret Hassell Done, FNP

## 2017-04-15 ENCOUNTER — Encounter: Payer: Self-pay | Admitting: Pharmacist Clinician (PhC)/ Clinical Pharmacy Specialist

## 2017-04-15 LAB — CMP14+EGFR
ALT: 15 IU/L (ref 0–44)
AST: 21 IU/L (ref 0–40)
Albumin/Globulin Ratio: 1.3 (ref 1.2–2.2)
Albumin: 3.9 g/dL (ref 3.2–4.6)
Alkaline Phosphatase: 183 IU/L — ABNORMAL HIGH (ref 39–117)
BUN/Creatinine Ratio: 16 (ref 10–24)
BUN: 22 mg/dL (ref 10–36)
Bilirubin Total: 0.3 mg/dL (ref 0.0–1.2)
CO2: 26 mmol/L (ref 20–29)
Calcium: 9.8 mg/dL (ref 8.6–10.2)
Chloride: 101 mmol/L (ref 96–106)
Creatinine, Ser: 1.38 mg/dL — ABNORMAL HIGH (ref 0.76–1.27)
GFR calc Af Amer: 51 mL/min/{1.73_m2} — ABNORMAL LOW (ref >59)
GFR calc non Af Amer: 44 mL/min/{1.73_m2} — ABNORMAL LOW (ref >59)
Globulin, Total: 3.1 g/dL (ref 1.5–4.5)
Glucose: 106 mg/dL — ABNORMAL HIGH (ref 65–99)
Potassium: 3.9 mmol/L (ref 3.5–5.2)
Sodium: 144 mmol/L (ref 134–144)
Total Protein: 7 g/dL (ref 6.0–8.5)

## 2017-04-19 ENCOUNTER — Telehealth: Payer: Self-pay

## 2017-04-19 DIAGNOSIS — I5042 Chronic combined systolic (congestive) and diastolic (congestive) heart failure: Secondary | ICD-10-CM

## 2017-04-19 MED ORDER — FUROSEMIDE 40 MG PO TABS: 40.0000 mg | ORAL_TABLET | Freq: Two times a day (BID) | ORAL | 2 refills | Status: DC

## 2017-04-19 NOTE — Telephone Encounter (Signed)
New rx sent  To pharmacy

## 2017-04-19 NOTE — Telephone Encounter (Signed)
Patient's furosemide filled as #60 take one daily  Is it suppose to be one or two daily?   [place] needs to know

## 2017-04-20 ENCOUNTER — Other Ambulatory Visit: Payer: Self-pay | Admitting: Family Medicine

## 2017-04-25 ENCOUNTER — Ambulatory Visit: Payer: Medicare Other | Admitting: Nurse Practitioner

## 2017-05-05 ENCOUNTER — Ambulatory Visit: Payer: Medicare Other | Admitting: Nurse Practitioner

## 2017-05-11 ENCOUNTER — Encounter: Payer: Self-pay | Admitting: Nurse Practitioner

## 2017-05-11 ENCOUNTER — Ambulatory Visit (INDEPENDENT_AMBULATORY_CARE_PROVIDER_SITE_OTHER): Payer: Medicare Other | Admitting: Nurse Practitioner

## 2017-05-11 DIAGNOSIS — Z86711 Personal history of pulmonary embolism: Secondary | ICD-10-CM | POA: Diagnosis not present

## 2017-05-11 DIAGNOSIS — I4891 Unspecified atrial fibrillation: Secondary | ICD-10-CM

## 2017-05-11 LAB — COAGUCHEK XS/INR WAIVED
INR: 2.7 — ABNORMAL HIGH (ref 0.9–1.1)
Prothrombin Time: 32.5 s

## 2017-05-11 NOTE — Addendum Note (Signed)
Addended by: Rolena Infante on: 05/11/2017 12:31 PM   Modules accepted: Orders

## 2017-05-11 NOTE — Progress Notes (Signed)
Subjective:   Patient comes in today for INR check   Indication: atrial fibrillation Bleeding signs/symptoms: None Thromboembolic signs/symptoms: None  Missed Coumadin doses: None Medication changes: no Dietary changes: no Bacterial/viral infection: no Other concerns: no  The following portions of the patient's history were reviewed and updated as appropriate: allergies, current medications, past family history, past medical history, past social history, past surgical history and problem list.  Review of Systems Pertinent items noted in HPI and remainder of comprehensive ROS otherwise negative.   Objective:    INR Today: 2.7 Current dose: 2.5mg  daily except 5mg  on mon and fri    Assessment:    Therapeutic INR for goal of 2.5-3.5   Plan:    1. New dose: no change   2. Next INR: 1 month    Mary-Margaret Hassell Done, FNP

## 2017-05-12 ENCOUNTER — Other Ambulatory Visit: Payer: Self-pay | Admitting: Nurse Practitioner

## 2017-05-12 ENCOUNTER — Encounter: Payer: Self-pay | Admitting: Family Medicine

## 2017-05-12 ENCOUNTER — Other Ambulatory Visit: Payer: Self-pay | Admitting: Family Medicine

## 2017-05-16 ENCOUNTER — Telehealth: Payer: Self-pay | Admitting: Cardiology

## 2017-05-16 NOTE — Telephone Encounter (Signed)
Closed Encounter  °

## 2017-05-18 ENCOUNTER — Ambulatory Visit: Payer: Medicare Other | Admitting: Cardiology

## 2017-05-19 ENCOUNTER — Other Ambulatory Visit: Payer: Self-pay | Admitting: Family Medicine

## 2017-05-20 ENCOUNTER — Other Ambulatory Visit: Payer: Self-pay | Admitting: Family Medicine

## 2017-06-03 DIAGNOSIS — H401113 Primary open-angle glaucoma, right eye, severe stage: Secondary | ICD-10-CM | POA: Diagnosis not present

## 2017-06-03 DIAGNOSIS — Z961 Presence of intraocular lens: Secondary | ICD-10-CM | POA: Diagnosis not present

## 2017-06-03 DIAGNOSIS — H401124 Primary open-angle glaucoma, left eye, indeterminate stage: Secondary | ICD-10-CM | POA: Diagnosis not present

## 2017-06-07 ENCOUNTER — Other Ambulatory Visit: Payer: Self-pay | Admitting: Family Medicine

## 2017-06-10 ENCOUNTER — Other Ambulatory Visit: Payer: Self-pay | Admitting: Family Medicine

## 2017-06-15 ENCOUNTER — Other Ambulatory Visit: Payer: Self-pay | Admitting: Family Medicine

## 2017-06-20 ENCOUNTER — Telehealth: Payer: Self-pay | Admitting: Cardiology

## 2017-06-20 NOTE — Telephone Encounter (Signed)
Closed Encounter  °

## 2017-06-22 ENCOUNTER — Other Ambulatory Visit: Payer: Self-pay | Admitting: Family Medicine

## 2017-06-29 ENCOUNTER — Ambulatory Visit: Payer: Medicare Other | Admitting: Cardiology

## 2017-07-06 DIAGNOSIS — R05 Cough: Secondary | ICD-10-CM | POA: Diagnosis not present

## 2017-07-06 DIAGNOSIS — Z6825 Body mass index (BMI) 25.0-25.9, adult: Secondary | ICD-10-CM | POA: Diagnosis not present

## 2017-07-06 DIAGNOSIS — J441 Chronic obstructive pulmonary disease with (acute) exacerbation: Secondary | ICD-10-CM | POA: Diagnosis not present

## 2017-07-06 NOTE — Progress Notes (Signed)
Cardiology Office Note   Date:  07/07/2017   ID:  Scott [name], DOB 1921-07-22, MRN [id number]  PCP:  Chipper Herb, MD  Cardiologist:   Minus Breeding, MD    Chief Complaint  Patient presents with  . Shortness of Breath      History of Present Illness: Scott [name] is a 84 y.o. male who presents for follow up of  NICM (06/2012 cath with patent cors - EF 40%,  2014 20-25%, 45% 2016), HTN, chronic LBBB, postural syncope related to diuretics, EtOH abuse, and DVT.  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    He has had some increasing dyspnea for the past couple of weeks.  He has been tired.  Last night he went to an urgent care.  Last night he went to an urgent care.  I was not able to see these records but I was able to pull up films from a disc they sent.  He does not appear to have frank pulmonary edema.  In the urgent care they suggest that he was probably having bronchitis or possible pneumonia by the report from the caregiver.  He was sent home with antibiotics, prednisone and albuterol.  Unfortunately this has not yet been started as they were not delivered to the house until today.  He is not having PND or orthopnea.  Is not had any swelling.  He has not had any chest pressure, neck or arm discomfort.  He has been coughing up some phlegm but does not describe the color.  He is not had any fevers or chills.   We saw him prior to a TURP most recently.   When he was seen in the office early last month he had some increased dyspnea and edema and required some increased Lasix.     Since that time he has done well.  He recently fell out of his motorized wheelchair when he hit a ditch in his yard while he was chasing a buzzard.  (Not a typo).  He had no trauma and we had a good laugh about this at the expense of his aide who had to pick him up and dislodge the wheelchair from the ditch.    He otherwise as done well.  The patient denies any new symptoms such as chest discomfort, neck  or arm discomfort. There has been no new shortness of breath, PND or orthopnea. There have been no reported palpitations, presyncope or syncope.  He might get some SOB. But responds to bronchodilators.  He has some leg edema but no PND or orthopnea.  He cannot weigh daily  Of note he had an echo today.  I reviewed the images as below.    Past Medical History:  Diagnosis Date  . Arthritis    "all over"  . Bladder cancer ([place]) 03/15/2012   pt denies this hx on 10/15/2014  . Bradycardia 11/28/2015   Severe-HR in the 20s to 30s following intubation for urological procedure.  Marland Kitchen CAD- non obstructive disease by cath 3/14   . Chronic lower back pain   . Chronic systolic CHF (congestive heart failure) ([place])    a. NICM - patent cors 06/2012, EF 40% at that time. b. 03/2013 eval: EF 20-25%.  . Chronic systolic heart failure ([place]) 10/04/14   EF improved 50-55%  . DDD (degenerative disc disease)   . Depression   . DVT (deep venous thrombosis) ([place])    "I think in both  legs"  . DVT, bilateral lower limbs ([place]) 03/2013   a. Bilat DVT dx 03/2013.  Marland Kitchen Elevated troponin    a. Adm 12/30-04/2013 - not felt to represent ACS; ?due to PE. b. Patent cors 06/2012.  Marland Kitchen ETOH abuse   . Frequency of urination   . Gangrene of toe ([place]) 10/03/2014  . Glaucoma   . HTN (hypertension)   . Hyperlipidemia   . Hypertension   . Nocturia   . NSVT (nonsustained ventricular tachycardia) ([place])    a. NSVT 06/2012; NSVT also seen during 03/2013 adm. b. Med rx. Not candidate for ICD given adv age.  Marland Kitchen PAD (peripheral artery disease) ([place])   . Pneumonia    "couple times"  . 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 Pulmonary embolism ([place])    a. By CT angio 04/2013.  Marland Kitchen PVCs (premature ventricular contractions)   . PVD (peripheral vascular disease) (HCC)    Right ABI .75, Left .78 (2006)  . Sepsis due to other etiology ([place]) 10/03/2014  .  Sleep apnea   . Syncope    a. Felt to be postural syncope related to diuretics 06/2012.    Past Surgical History:  Procedure Laterality Date  . AMPUTATION Left 10/16/2014   Procedure: AMPUTATION ABOVE KNEE- LEFT;  Surgeon: Mal Misty, MD;  Location: [facility];  Service: Vascular;  Laterality: Left;  . CARDIAC CATHETERIZATION  05-11-2004  [facility]   MINIMAL CORANARY PLAQUE/ NORMAL LVF/ EF 55%/  NON-OBSTRUCTIVE LAD 25%  . CARDIAC CATHETERIZATION  06/2012  . CARDIOVASCULAR STRESS TEST  10-15-2010   LOW RISK NUCLEAR STUDY/ NO EVIDENCE OF ISCHEMIA/ NORMAL EF  . CATARACT EXTRACTION W/ INTRAOCULAR LENS  IMPLANT, BILATERAL Bilateral   . CYSTOSCOPY  10/07/2011   Procedure: CYSTOSCOPY;  Surgeon: Malka So, MD;  Location: [facility];  Service: Urology;  Laterality: N/A;  . CYSTOSCOPY WITH BIOPSY  03/14/2012   Procedure: CYSTOSCOPY WITH BIOPSY;  Surgeon: Malka So, MD;  Location: [facility];  Service: Urology;  Laterality: N/A;  WITH FULGURATION  . CYSTOSCOPY WITH BIOPSY N/A 12/09/2016   Procedure: CYSTOSCOPY WITH BIOPSY AND FULGURATION;  Surgeon: [name], MD;  Location: [facility];  Service: Urology;  Laterality: N/A;  . CYSTOSCOPY WITH FULGERATION N/A 01/20/2016   Procedure: CYSTOSCOPY, BIOPSY WITH FULGERATION OF URTHRAL TUMOR;  Surgeon: [name], MD;  Location: [facility];  Service: Urology;  Laterality: N/A;  . CYSTOSCOPY WITH FULGERATION N/A 06/01/2016   Procedure: CYSTOSCOPY WITH FULGERATION urethral tumors and bladder neck;  Surgeon: [name], MD;  Location: [facility];  Service: Urology;  Laterality: N/A;  . EYE SURGERY    . SHOULDER SURGERY  1970's   "don't remember which side or what kind of OR"  . TONSILLECTOMY    . TRANSTHORACIC ECHOCARDIOGRAM  09-22-2010   MODERATE LVH/ EF 37%/ GRADE I DIASTOLIC DYSFUNCTION/ AORTIC SCLEROSIS WITHOUT STENOSIS/  RV  SYSTOLIC  MILDLY REDUCED FUNCTION  . TRANSURETHRAL RESECTION OF BLADDER TUMOR  10/07/2011   Procedure: TRANSURETHRAL RESECTION OF  BLADDER TUMOR (TURBT);  Surgeon: Malka So, MD;  Location[facility];  Service: Urology;  Laterality: N/A;  . TRANSURETHRAL RESECTION OF BLADDER TUMOR N/A 07/10/2015   Procedure: TRANSURETHRAL RESECTION OF BLADDER TUMOR (TURBT), CYSTOSCOPY ;  Surgeon: [name], MD;  Location: [facility];  Service: Urology;  Laterality: N/A;  . TRANSURETHRAL RESECTION OF PROSTATE  03/14/2012  Procedure: TRANSURETHRAL RESECTION OF THE PROSTATE WITH GYRUS INSTRUMENTS;  Surgeon: Malka So, MD;  Location: [facility];  Service: Urology;  Laterality: N/A;     Current Outpatient Medications  Medication Sig Dispense Refill  . acetaminophen (TYLENOL) 500 MG tablet Take 500 mg by mouth every 6 (six) hours as needed for moderate pain or headache.    . albuterol (PROVENTIL HFA;VENTOLIN HFA) 108 (90 Base) MCG/ACT inhaler Inhale 2 puffs into the lungs every 6 (six) hours as needed for wheezing or shortness of breath. 1 Inhaler 6  . amoxicillin (AMOXIL) 875 MG tablet Take 875 mg by mouth 2 (two) times daily. For 10 days    . benzonatate (TESSALON) 200 MG capsule Take 1 capsule (200 mg total) by mouth 2 (two) times daily as needed for cough. 20 capsule 0  . COMBIVENT RESPIMAT 20-100 MCG/ACT AERS respimat 1 PUFF EVERY 4 HOURS AS NEEDED FOR COUGH, WHEEZING, OR SHORTNESS OF BREATH 4 g 4  . donepezil (ARICEPT) 10 MG tablet TAKE ONE TABLET AT BEDTIME 30 tablet 5  . FEROSUL 325 (65 Fe) MG tablet Take 1 tablet (325 mg total) by mouth daily with breakfast. 30 tablet 0  . FEROSUL 325 (65 Fe) MG tablet Take 1 tablet (325 mg total) by mouth daily with breakfast. 30 tablet 2  . ferrous sulfate 325 (65 FE) MG tablet Take 1 tablet (325 mg total) by mouth daily with breakfast. 30 tablet 4  . fluticasone (FLONASE) 50 MCG/ACT nasal spray Place 1 spray into both nostrils daily as needed for allergies or rhinitis. (Patient taking differently: Place 1 spray into both nostrils at bedtime. ) 16 g 2  . folic acid (FOLVITE) 1 MG tablet  TAKE 1 TABLET DAILY 30 tablet 5  . furosemide (LASIX) 40 MG tablet Take 1 tablet (40 mg total) by mouth 2 (two) times daily. 60 tablet 2  . gabapentin (NEURONTIN) 100 MG capsule TAKE (2) CAPSULES TWICE DAILY. 120 capsule 0  . isosorbide dinitrate (ISORDIL) 30 MG tablet TAKE 1 TABLET ONCE A DAY 30 tablet 5  . metoprolol succinate (TOPROL-XL) 50 MG 24 hr tablet TAKE 1 TABLET DAILY 30 tablet 5  . MYRBETRIQ 25 MG TB24 tablet Take 25 mg by mouth daily.     . nitroGLYCERIN (NITROSTAT) 0.4 MG SL tablet Place 1 tablet (0.4 mg total) under the tongue every 5 (five) minutes as needed for chest pain (up to 3 doses). 25 tablet 3  . pantoprazole (PROTONIX) 40 MG tablet TAKE 1 TABLET DAILY 90 tablet 1  . predniSONE (DELTASONE) 10 MG tablet Take 4 po qd x 2d then 3 po qd x 2d then 2 po qd x 2d then 1 po qd x 2d then stop    . sertraline (ZOLOFT) 50 MG tablet TAKE 1 TABLET IN THE MORNING 90 tablet 0  . simvastatin (ZOCOR) 10 MG tablet TAKE 1 TABLET DAILY 30 tablet 5  . Spacer/Aero Chamber [employer] Use with Albuterol HFA inhaler as directed 1 each 1  . ULORIC 40 MG tablet TAKE 1 TABLET DAILY 30 tablet 2  . warfarin (COUMADIN) 2 MG tablet TAKE 1 TABLET ALONG WITH 2.5MG  TO EQUAL 4.5 MG DAILY 30 tablet 5  . warfarin (COUMADIN) 2.5 MG tablet Take 5 mg Friday March 29, then take 2.5 mg daily except 5 mg each Monday. 10 tablet 0   No current facility-administered medications for this visit.     Allergies:   Lipitor [atorvastatin]    ROS:  Please see  the history of present illness.   Otherwise, review of systems are positive for none.   All other systems are reviewed and negative.    PHYSICAL EXAM: VS:  BP (!) 155/65   Pulse 65   Ht 5\' 6"  (1.676 m)   Wt 160 lb (72.6 kg)   SpO2 94%   BMI 25.82 kg/m  , BMI Body mass index is 25.82 kg/m.  PHYSICAL EXAM GEN:  No distress NECK:  No jugular venous distention at 90 degrees, waveform within normal limits, carotid upstroke brisk and symmetric, no bruits,  no thyromegaly LYMPHATICS:   Scattered wheezing, no crackles decreased breath sounds LUNGS:  Clear to auscultation bilaterally BACK:  No CVA tenderness CHEST:  Unremarkable HEART:  S1 and S2 within normal limits, no S3, no S4, no clicks, no rubs, no murmurs ABD:  Positive bowel sounds normal in frequency in pitch, no bruits, no rebound, no guarding, unable to assess midline mass or bruit with the patient seated. EXT:  2 plus pulses throughout, moderate edema, no cyanosis no clubbing, status post left AKA    EKG:  EKG is ordered today. Sinus rhythm, rate 71, left bundle branch block, first-degree AV block, leftward axis.  No change from previous.  Recent Labs: 02/08/2017: BNP 405.6 04/14/2017: ALT 15; BUN 22; Creatinine, Ser 1.38; Hemoglobin 14.4; Platelets 175; Potassium 3.9; Sodium 144    Lipid Panel    Component Value Date/Time   CHOL 171 08/03/2016 1118   TRIG 151 (H) 08/03/2016 1118   TRIG 194 (H) 03/26/2014 1446   HDL 41 08/03/2016 1118   HDL 35 (L) 03/26/2014 1446   CHOLHDL 4.2 08/03/2016 1118   CHOLHDL 2.6 11/02/2012 1310   VLDL 44 (H) 11/02/2012 1310   LDLCALC 100 (H) 08/03/2016 1118   LDLCALC 42 05/17/2013 1101      Wt Readings from Last 3 Encounters:  07/07/17 160 lb (72.6 kg)  02/15/17 160 lb (72.6 kg)  12/09/16 169 lb (76.7 kg)      Other studies Reviewed: Additional studies/ records that were reviewed today include: CXR personally reviewed Review of the above records demonstrates:     ASSESSMENT AND PLAN:   Chronic Diastolic heart failure - He does have shortness of breath but this does not appear to be overt volume overload and this was assessed at the urgent care where they wanted to treat him for possible pneumonia and bronchitis.  I think he should continue with this plan and start the treatment which is now available at his home to include nebulizer, steroids and antibiotics.  However, if he becomes worse rather than better he should proceed to the  emergency room.  At this point I do not have evidence for excessive volume overload.   PVCs (premature ventricular contractions) -  He is not feeling these.  No change in therapy.  HTN (hypertension) -  The blood pressure is slightly elevated.  However, this seems to be unusual.  No change in therapy.  DVT/PULMONARY EMBOLI-  He remains on anticoagulation.  ATRIAL FIB -  Scott [name] has a CHA2DS2 - VASc score of 4 with a risk of stroke of 4%.  He tolerates meds as listed.       Current medicines are reviewed at length with the patient today.  The patient does not have concerns regarding medicines.  The following changes have been made:  None  Labs/ tests ordered today include: Nond No orders of the defined types were placed in this encounter.  Disposition:   Follow up in six months or sooner if needed.    Signed, Minus Breeding, MD  07/07/2017 5:26 PM    Scott [name]

## 2017-07-07 ENCOUNTER — Ambulatory Visit (INDEPENDENT_AMBULATORY_CARE_PROVIDER_SITE_OTHER): Payer: Medicare Other | Admitting: Pharmacist Clinician (PhC)/ Clinical Pharmacy Specialist

## 2017-07-07 ENCOUNTER — Ambulatory Visit (INDEPENDENT_AMBULATORY_CARE_PROVIDER_SITE_OTHER): Payer: Medicare Other | Admitting: Cardiology

## 2017-07-07 ENCOUNTER — Encounter: Payer: Self-pay | Admitting: Cardiology

## 2017-07-07 ENCOUNTER — Other Ambulatory Visit: Payer: Self-pay | Admitting: Family Medicine

## 2017-07-07 VITALS — BP 155/65 | HR 65 | Ht 66.0 in | Wt 160.0 lb

## 2017-07-07 DIAGNOSIS — Z86711 Personal history of pulmonary embolism: Secondary | ICD-10-CM

## 2017-07-07 DIAGNOSIS — R0602 Shortness of breath: Secondary | ICD-10-CM

## 2017-07-07 DIAGNOSIS — I10 Essential (primary) hypertension: Secondary | ICD-10-CM

## 2017-07-07 DIAGNOSIS — I480 Paroxysmal atrial fibrillation: Secondary | ICD-10-CM

## 2017-07-07 DIAGNOSIS — I428 Other cardiomyopathies: Secondary | ICD-10-CM

## 2017-07-07 DIAGNOSIS — I4891 Unspecified atrial fibrillation: Secondary | ICD-10-CM

## 2017-07-07 LAB — POCT INR: INR: 1.6

## 2017-07-07 MED ORDER — WARFARIN SODIUM 2.5 MG PO TABS
ORAL_TABLET | ORAL | 0 refills | Status: DC
Start: 2017-07-07 — End: 2017-08-05

## 2017-07-07 MED ORDER — WARFARIN SODIUM 2.5 MG PO TABS: ORAL_TABLET | ORAL | 0 refills | Status: DC

## 2017-07-07 NOTE — Patient Instructions (Signed)
Description   Take 5 mg on Friday March 29, then take 2.5 mg daily except 5 mg each Monday.  Repeat INR with [facility] next week

## 2017-07-07 NOTE — Patient Instructions (Signed)
Medication Instructions:  Continue current medication  If you need a refill on your cardiac medications before your next appointment, please call your pharmacy.  Labwork: None Ordered   Testing/Procedures: None Ordered  Follow-Up: Your physician wants you to follow-up in: 6 Months. You should receive a reminder letter in the mail two months in advance. If you do not receive a letter, please call our office [phone].     Thank you for choosing [facility]!!

## 2017-07-08 NOTE — Addendum Note (Signed)
Addended by: Vennie Homans on: 07/08/2017 10:32 AM   Modules accepted: Orders

## 2017-07-28 ENCOUNTER — Other Ambulatory Visit: Payer: Self-pay | Admitting: Family Medicine

## 2017-07-28 ENCOUNTER — Other Ambulatory Visit: Payer: Self-pay | Admitting: Nurse Practitioner

## 2017-07-28 ENCOUNTER — Telehealth: Payer: Self-pay

## 2017-07-28 NOTE — Telephone Encounter (Signed)
[facility] patient   Scott [name] calling that they don't know what his Coumadin dose should be  He said he was suppose to get another PT on 07/13/17

## 2017-07-28 NOTE — Telephone Encounter (Signed)
Can we call patient and see what his current dose is?

## 2017-08-05 ENCOUNTER — Other Ambulatory Visit: Payer: Self-pay

## 2017-08-05 ENCOUNTER — Ambulatory Visit (INDEPENDENT_AMBULATORY_CARE_PROVIDER_SITE_OTHER): Payer: Medicare Other | Admitting: Pharmacist Clinician (PhC)/ Clinical Pharmacy Specialist

## 2017-08-05 DIAGNOSIS — I4891 Unspecified atrial fibrillation: Secondary | ICD-10-CM

## 2017-08-05 DIAGNOSIS — Z86711 Personal history of pulmonary embolism: Secondary | ICD-10-CM | POA: Diagnosis not present

## 2017-08-05 LAB — COAGUCHEK XS/INR WAIVED
INR: 2.1 — ABNORMAL HIGH (ref 0.9–1.1)
Prothrombin Time: 25.8 s

## 2017-08-05 MED ORDER — WARFARIN SODIUM 2.5 MG PO TABS: ORAL_TABLET | ORAL | 0 refills | Status: DC

## 2017-08-05 NOTE — Patient Instructions (Signed)
Description   Continue taking warfarin the same way.  INR today is 2.1 (goal is 2-3)  Perfect reading today

## 2017-08-12 ENCOUNTER — Telehealth: Payer: Self-pay | Admitting: Family Medicine

## 2017-08-12 NOTE — Telephone Encounter (Signed)
Refill and dosing given to pharm

## 2017-08-18 ENCOUNTER — Other Ambulatory Visit: Payer: Self-pay | Admitting: Family Medicine

## 2017-08-18 ENCOUNTER — Other Ambulatory Visit: Payer: Self-pay | Admitting: Nurse Practitioner

## 2017-08-18 ENCOUNTER — Other Ambulatory Visit: Payer: Self-pay | Admitting: Family

## 2017-08-18 DIAGNOSIS — I5042 Chronic combined systolic (congestive) and diastolic (congestive) heart failure: Secondary | ICD-10-CM

## 2017-09-02 ENCOUNTER — Encounter: Payer: Self-pay | Admitting: *Deleted

## 2017-09-14 ENCOUNTER — Other Ambulatory Visit: Payer: Self-pay | Admitting: Family

## 2017-09-14 ENCOUNTER — Other Ambulatory Visit: Payer: Self-pay | Admitting: Nurse Practitioner

## 2017-09-14 ENCOUNTER — Ambulatory Visit (INDEPENDENT_AMBULATORY_CARE_PROVIDER_SITE_OTHER): Payer: Medicare Other | Admitting: Pharmacist Clinician (PhC)/ Clinical Pharmacy Specialist

## 2017-09-14 DIAGNOSIS — Z86711 Personal history of pulmonary embolism: Secondary | ICD-10-CM | POA: Diagnosis not present

## 2017-09-14 DIAGNOSIS — I4891 Unspecified atrial fibrillation: Secondary | ICD-10-CM | POA: Diagnosis not present

## 2017-09-14 DIAGNOSIS — I5042 Chronic combined systolic (congestive) and diastolic (congestive) heart failure: Secondary | ICD-10-CM

## 2017-09-14 LAB — COAGUCHEK XS/INR WAIVED
INR: 2.7 — ABNORMAL HIGH (ref 0.9–1.1)
Prothrombin Time: 32.5 s

## 2017-09-14 NOTE — Patient Instructions (Signed)
Description   Continue taking warfarin the same way.  INR today is 2.7 (goal is 2-3)  Perfect reading today

## 2017-09-16 ENCOUNTER — Encounter: Payer: Self-pay | Admitting: Pharmacist Clinician (PhC)/ Clinical Pharmacy Specialist

## 2017-09-20 ENCOUNTER — Other Ambulatory Visit: Payer: Self-pay | Admitting: Family Medicine

## 2017-09-23 ENCOUNTER — Telehealth: Payer: Self-pay | Admitting: Family Medicine

## 2017-09-23 NOTE — Telephone Encounter (Signed)
Called - aware of form that we will need - appt made for 30 min face to face

## 2017-09-30 ENCOUNTER — Encounter: Payer: Self-pay | Admitting: Family Medicine

## 2017-09-30 ENCOUNTER — Ambulatory Visit (INDEPENDENT_AMBULATORY_CARE_PROVIDER_SITE_OTHER): Payer: Medicare Other | Admitting: Family Medicine

## 2017-09-30 VITALS — BP 134/80 | HR 68 | Temp 97.2°F

## 2017-09-30 DIAGNOSIS — I5040 Unspecified combined systolic (congestive) and diastolic (congestive) heart failure: Secondary | ICD-10-CM | POA: Diagnosis not present

## 2017-09-30 DIAGNOSIS — J441 Chronic obstructive pulmonary disease with (acute) exacerbation: Secondary | ICD-10-CM

## 2017-09-30 DIAGNOSIS — Z89612 Acquired absence of left leg above knee: Secondary | ICD-10-CM

## 2017-09-30 DIAGNOSIS — IMO0002 Reserved for concepts with insufficient information to code with codable children: Secondary | ICD-10-CM

## 2017-09-30 DIAGNOSIS — Z993 Dependence on wheelchair: Secondary | ICD-10-CM

## 2017-09-30 NOTE — Progress Notes (Addendum)
BP 134/80   Pulse 68   Temp (!) 97.2 F (36.2 C) (Oral)   SpO2 94%    Subjective:    Patient ID: Scott [name], male    DOB: 11-29-1921, 84 y.o.   MRN: [id number]  HPI: Scott [name] is a 84 y.o. male presenting on 09/30/2017 for Mobility Evaluation   HPI Left above-the-knee amputation and CHF and COPD Patient is coming in today because he wants to see about getting a powered wheelchair for power mobility evaluation.  Patient is currently wheelchair-bound and using a manual wheelchair.  He has an above-the-knee amputation of his left leg.  Patient has tried to use cane and walker with a prosthetic leg but the prosthetic leg has caused him too much pain in that does not seem like he can get one that fit well and he cannot use a cane or walker without a leg.  In his house he has very small hallways and tight corners especially to get in and out of his room and because of that a scooter would not work well at his home.  He is to the point where he needs a wheelchair of some kind all of the time and he is even starting to have difficulty on his own transferring from bed to his current wheelchair.  He will he has COPD and CHF and gets very short of breath with even small amounts of exertion and that is why he is not doing very well with his manual wheelchair and why he needs a power operated wheelchair because he does not have the physical stamina or ability to operate a manual wheelchair on his own without somebody pushing it for him.  He is very motivated to use a power wheelchair to be able to use it to get around in his home to his kitchen and be able to feed himself and to get around in his bedroom to where he can get his close and dress himself.  Currently he has aids that help with those things because he cannot do them on his own currently.  Patient also has been having difficulty getting in out of the bathroom at his home because he cannot operate the manual wheelchair for even short distances.   Patient would be able to operate a power wheelchair safely at home and he would be able to operate a joystick.  Patient likely would be in a power wheelchair greater than 2 hours when he is in and around his house and adjustable height armrests would be beneficial so that he could lower them to be able to transfer himself between the chair in the toilet in the shower.  Patient has complete mental ability to be able to use 1 of these devices and mainly has limited physical ability because of his above-the-knee amputation and shortness of breath that he gets from his CHF and COPD.  Patient does want the power mobility device to be used at home to be able to perform ADLs and take care of himself much better on his own.  He has had falls from using a manual wheelchair because sometimes it will move or roll out from under him because of the difficulty getting in and out of it and that is also why the adjustable arm height would benefit him.   Relevant past medical, surgical, family and social history reviewed and updated as indicated. Interim medical history since our last visit reviewed. Allergies and medications reviewed and updated.  Review of  Systems  Constitutional: Negative for chills and fever.  Eyes: Negative for discharge.  Respiratory: Negative for shortness of breath and wheezing.   Cardiovascular: Negative for chest pain and leg swelling.  Musculoskeletal: Positive for gait problem. Negative for back pain.  Skin: Negative for rash.  Neurological: Negative for facial asymmetry, weakness and headaches.  All other systems reviewed and are negative.   Per HPI unless specifically indicated above   Allergies as of 09/30/2017      Reactions   Lipitor [atorvastatin] Other (See Comments)   unknown      Medication List        Accurate as of 09/30/17 11:39 AM. Always use your most recent med list.          acetaminophen 500 MG tablet Commonly known as:  TYLENOL Take 500 mg by mouth  every 6 (six) hours as needed for moderate pain or headache.   albuterol 108 (90 Base) MCG/ACT inhaler Commonly known as:  PROVENTIL HFA;VENTOLIN HFA Inhale 2 puffs into the lungs every 6 (six) hours as needed for wheezing or shortness of breath.   COMBIVENT RESPIMAT 20-100 MCG/ACT Aers respimat Generic drug:  Ipratropium-Albuterol 1 PUFF EVERY 4 HOURS AS NEEDED FOR COUGH, WHEEZING, OR SHORTNESS OF BREATH   donepezil 10 MG tablet Commonly known as:  ARICEPT TAKE ONE TABLET AT BEDTIME   FEROSUL 325 (65 FE) MG tablet Generic drug:  ferrous sulfate Take 1 tablet (325 mg total) by mouth daily with breakfast.   fluticasone 50 MCG/ACT nasal spray Commonly known as:  FLONASE Place 1 spray into both nostrils daily as needed for allergies or rhinitis.   folic acid 1 MG tablet Commonly known as:  FOLVITE TAKE 1 TABLET DAILY   furosemide 40 MG tablet Commonly known as:  LASIX Take 1 tablet (40 mg total) by mouth 2 (two) times daily.   gabapentin 100 MG capsule Commonly known as:  NEURONTIN TAKE (2) CAPSULES TWICE DAILY.   isosorbide dinitrate 30 MG tablet Commonly known as:  ISORDIL TAKE 1 TABLET ONCE A DAY   metoprolol succinate 50 MG 24 hr tablet Commonly known as:  TOPROL-XL TAKE 1 TABLET DAILY   MYRBETRIQ 25 MG Tb24 tablet Generic drug:  mirabegron ER Take 25 mg by mouth daily.   nitroGLYCERIN 0.4 MG SL tablet Commonly known as:  NITROSTAT Place 1 tablet (0.4 mg total) under the tongue every 5 (five) minutes as needed for chest pain (up to 3 doses).   pantoprazole 40 MG tablet Commonly known as:  PROTONIX TAKE 1 TABLET DAILY   sertraline 50 MG tablet Commonly known as:  ZOLOFT TAKE 1 TABLET IN THE MORNING   simvastatin 10 MG tablet Commonly known as:  ZOCOR TAKE 1 TABLET DAILY   Spacer/Aero Chamber Mouthpiece Misc Use with Albuterol HFA inhaler as directed   ULORIC 40 MG tablet Generic drug:  febuxostat TAKE 1 TABLET DAILY   warfarin 2 MG tablet Commonly  known as:  COUMADIN Take as directed by the anticoagulation clinic. If you are unsure how to take this medication, talk to your nurse or doctor. Original instructions:  TAKE 1 TABLET ALONG WITH 2.5MG  TO EQUAL 4.5 MG DAILY   warfarin 2.5 MG tablet Commonly known as:  COUMADIN Take as directed by the anticoagulation clinic. If you are unsure how to take this medication, talk to your nurse or doctor. Original instructions:  Take 5 mg Friday March 29, then take 2.5 mg daily except 5 mg each Monday.  Objective:    BP 134/80   Pulse 68   Temp (!) 97.2 F (36.2 C) (Oral)   SpO2 94%   Wt Readings from Last 3 Encounters:  07/07/17 160 lb (72.6 kg)  02/15/17 160 lb (72.6 kg)  12/09/16 169 lb (76.7 kg)    Physical Exam  Constitutional: He is oriented to person, place, and time. He appears well-developed and well-nourished. No distress.  Patient unable to ambulate because of the above the knee amputation on his left leg, is only able to use manual wheelchair for distances of 3 or 4 feet before becoming short of breath.  Patient's posture and balance and coordination seem intact. Patient does have a small house but is unable to move around his house because of how short of breath he will get in only 3 feet and cannot do the basic ADLs because of it.  Eyes: Conjunctivae are normal. No scleral icterus.  Cardiovascular: Normal rate, regular rhythm, normal heart sounds and intact distal pulses.  No murmur heard. Pulmonary/Chest: Effort normal. No respiratory distress. He has wheezes. He has rhonchi.  Musculoskeletal: Normal range of motion. He exhibits deformity. He exhibits no edema.  Above the knee amputation, healed well on left leg. 5 out of 5 strength in both upper extremities, range of motion intact in both upper extremities.  Pain level is 0 out of 10, patient denies any pain today.  Neurological: He is alert and oriented to person, place, and time. Coordination normal.  Skin: Skin  is warm and dry. No rash noted. He is not diaphoretic.  Psychiatric: He has a normal mood and affect. His behavior is normal.  Nursing note and vitals reviewed.       Assessment & Plan:   Problem List Items Addressed This Visit    None    Visit Diagnoses    Above knee amputation status, left ([place])    -  Primary   Relevant Orders   DME Wheelchair electric   Combined systolic and diastolic congestive heart failure, unspecified HF chronicity ([place])       Relevant Orders   DME Wheelchair electric   COPD exacerbation ([place])       Relevant Orders   DME Wheelchair electric   Wheelchair bound       Relevant Orders   DME Wheelchair electric       Follow up plan: Return if symptoms worsen or fail to improve.  Counseling provided for all of the vaccine components Orders Placed This Encounter  Procedures  . DME Wheelchair electric    Caryl Pina, MD [facility] 09/30/2017, 11:39 AM

## 2017-10-10 ENCOUNTER — Other Ambulatory Visit: Payer: Self-pay | Admitting: Nurse Practitioner

## 2017-10-11 ENCOUNTER — Other Ambulatory Visit: Payer: Self-pay | Admitting: Nurse Practitioner

## 2017-10-11 DIAGNOSIS — I5042 Chronic combined systolic (congestive) and diastolic (congestive) heart failure: Secondary | ICD-10-CM

## 2017-10-14 ENCOUNTER — Ambulatory Visit: Payer: Medicare Other | Admitting: Urology

## 2017-10-24 ENCOUNTER — Other Ambulatory Visit: Payer: Self-pay | Admitting: Family Medicine

## 2017-10-24 ENCOUNTER — Other Ambulatory Visit: Payer: Self-pay | Admitting: Nurse Practitioner

## 2017-10-25 ENCOUNTER — Other Ambulatory Visit: Payer: Self-pay | Admitting: Family

## 2017-10-25 ENCOUNTER — Other Ambulatory Visit: Payer: Self-pay | Admitting: Family Medicine

## 2017-10-25 ENCOUNTER — Other Ambulatory Visit: Payer: Self-pay | Admitting: Nurse Practitioner

## 2017-10-26 NOTE — Telephone Encounter (Signed)
Last lipid 08/08/16  DWM

## 2017-10-28 ENCOUNTER — Ambulatory Visit (INDEPENDENT_AMBULATORY_CARE_PROVIDER_SITE_OTHER): Payer: Medicare Other | Admitting: Pharmacist Clinician (PhC)/ Clinical Pharmacy Specialist

## 2017-10-28 DIAGNOSIS — I4891 Unspecified atrial fibrillation: Secondary | ICD-10-CM

## 2017-10-28 DIAGNOSIS — Z86711 Personal history of pulmonary embolism: Secondary | ICD-10-CM | POA: Diagnosis not present

## 2017-10-28 LAB — COAGUCHEK XS/INR WAIVED
INR: 1.8 — ABNORMAL HIGH (ref 0.9–1.1)
Prothrombin Time: 22 s

## 2017-10-28 NOTE — Patient Instructions (Signed)
Description   Take 3 tablets today (7.5mg ) only then continue taking warfarin the same way.  INR today is 1.8  (goal is 2-3)  A little thick today

## 2017-11-04 ENCOUNTER — Other Ambulatory Visit: Payer: Self-pay | Admitting: Family Medicine

## 2017-11-22 ENCOUNTER — Other Ambulatory Visit: Payer: Self-pay | Admitting: Family Medicine

## 2017-12-02 ENCOUNTER — Other Ambulatory Visit (HOSPITAL_COMMUNITY)
Admission: RE | Admit: 2017-12-02 | Discharge: 2017-12-02 | Disposition: A | Discharge status: Home / Self Care | Payer: Medicare Other | Source: Ambulatory Visit | Attending: Urology | Admitting: Urology

## 2017-12-02 ENCOUNTER — Ambulatory Visit (INDEPENDENT_AMBULATORY_CARE_PROVIDER_SITE_OTHER): Payer: Medicare Other | Admitting: Urology

## 2017-12-02 DIAGNOSIS — C674 Malignant neoplasm of posterior wall of bladder: Secondary | ICD-10-CM | POA: Insufficient documentation

## 2017-12-02 DIAGNOSIS — R828 Abnormal findings on cytological and histological examination of urine: Secondary | ICD-10-CM | POA: Diagnosis not present

## 2017-12-05 ENCOUNTER — Other Ambulatory Visit: Payer: Self-pay | Admitting: Family Medicine

## 2017-12-08 ENCOUNTER — Other Ambulatory Visit: Payer: Self-pay | Admitting: Urology

## 2017-12-08 ENCOUNTER — Telehealth: Payer: Self-pay | Admitting: Family Medicine

## 2017-12-08 NOTE — Telephone Encounter (Signed)
I filled out form, it is on my desk but stop it 5 days before and start the day after

## 2017-12-08 NOTE — Telephone Encounter (Signed)
Have you seen fax?

## 2017-12-09 ENCOUNTER — Ambulatory Visit: Payer: Medicare Other | Admitting: Pharmacist Clinician (PhC)/ Clinical Pharmacy Specialist

## 2017-12-09 DIAGNOSIS — Z86711 Personal history of pulmonary embolism: Secondary | ICD-10-CM

## 2017-12-09 DIAGNOSIS — I4891 Unspecified atrial fibrillation: Secondary | ICD-10-CM

## 2017-12-09 LAB — COAGUCHEK XS/INR WAIVED
INR: 2.2 — ABNORMAL HIGH (ref 0.9–1.1)
Prothrombin Time: 26.9 s

## 2017-12-09 NOTE — Patient Instructions (Signed)
Description   Continue taking warfarin the same way.  INR today is 2.2  (goal is 2-3)  Perfect reading

## 2017-12-15 ENCOUNTER — Encounter (HOSPITAL_COMMUNITY): Payer: Self-pay

## 2017-12-15 ENCOUNTER — Other Ambulatory Visit: Payer: Self-pay

## 2017-12-15 ENCOUNTER — Encounter (HOSPITAL_COMMUNITY)
Admission: RE | Admit: 2017-12-15 | Discharge: 2017-12-15 | Disposition: A | Discharge status: Home / Self Care | Payer: Medicare Other | Source: Ambulatory Visit | Attending: Urology | Admitting: Urology

## 2017-12-15 DIAGNOSIS — Z01812 Encounter for preprocedural laboratory examination: Secondary | ICD-10-CM | POA: Diagnosis not present

## 2017-12-15 DIAGNOSIS — Z8551 Personal history of malignant neoplasm of bladder: Secondary | ICD-10-CM | POA: Insufficient documentation

## 2017-12-15 HISTORY — DX: Unspecified atrial fibrillation: I48.91

## 2017-12-15 HISTORY — DX: Chronic obstructive pulmonary disease, unspecified: J44.9

## 2017-12-15 HISTORY — DX: Left bundle-branch block, unspecified: I44.7

## 2017-12-15 HISTORY — DX: Presence of spectacles and contact lenses: Z97.3

## 2017-12-15 HISTORY — DX: Unspecified glaucoma: H40.9

## 2017-12-15 HISTORY — DX: Other cardiomyopathies: I42.8

## 2017-12-15 HISTORY — DX: Personal history of other venous thrombosis and embolism: Z86.718

## 2017-12-15 HISTORY — DX: Unqualified visual loss, left eye, normal vision right eye: H54.62

## 2017-12-15 HISTORY — DX: Personal history of pulmonary embolism: Z86.711

## 2017-12-15 HISTORY — DX: Dyspnea, unspecified: R06.00

## 2017-12-15 HISTORY — DX: Gastro-esophageal reflux disease without esophagitis: K21.9

## 2017-12-15 HISTORY — DX: Personal history of malignant neoplasm of bladder: Z85.51

## 2017-12-15 HISTORY — DX: Chronic combined systolic (congestive) and diastolic (congestive) heart failure: I50.42

## 2017-12-15 HISTORY — DX: Benign prostatic hyperplasia with lower urinary tract symptoms: N40.1

## 2017-12-15 LAB — COMPREHENSIVE METABOLIC PANEL
ALT: 16 U/L (ref 0–44)
AST: 23 U/L (ref 15–41)
Albumin: 4.1 g/dL (ref 3.5–5.0)
Alkaline Phosphatase: 182 U/L — ABNORMAL HIGH (ref 38–126)
Anion gap: 11 (ref 5–15)
BUN: 26 mg/dL — ABNORMAL HIGH (ref 8–23)
CO2: 29 mmol/L (ref 22–32)
Calcium: 9.7 mg/dL (ref 8.9–10.3)
Chloride: 101 mmol/L (ref 98–111)
Creatinine, Ser: 1.33 mg/dL — ABNORMAL HIGH (ref 0.61–1.24)
GFR calc Af Amer: 51 mL/min — ABNORMAL LOW (ref >60)
GFR calc non Af Amer: 44 mL/min — ABNORMAL LOW (ref >60)
Glucose, Bld: 85 mg/dL (ref 70–99)
Potassium: 3.8 mmol/L (ref 3.5–5.1)
Sodium: 141 mmol/L (ref 135–145)
Total Bilirubin: 0.8 mg/dL (ref 0.3–1.2)
Total Protein: 7.6 g/dL (ref 6.5–8.1)

## 2017-12-15 LAB — CBC
HCT: 46.9 % (ref 39.0–52.0)
Hemoglobin: 15.2 g/dL (ref 13.0–17.0)
MCH: 27.7 pg (ref 26.0–34.0)
MCHC: 32.4 g/dL (ref 30.0–36.0)
MCV: 85.4 fL (ref 78.0–100.0)
Platelets: 156 10*3/uL (ref 150–400)
RBC: 5.49 MIL/uL (ref 4.22–5.81)
RDW: 14.9 % (ref 11.5–15.5)
WBC: 3.8 10*3/uL — ABNORMAL LOW (ref 4.0–10.5)

## 2017-12-15 NOTE — Progress Notes (Addendum)
EKG in epic, dated 03-28-219 CARDIOLOGIST LOV NOTE, epic dated 07-07-2017 Telephone clearance , dr dettingter, in epic dated 12-08-2017, and clearance faxed from dr Jeffie Pollock office with chart.   ADDENDUM:  CMP result dated 12-15-2017 in epic.

## 2017-12-15 NOTE — Patient Instructions (Addendum)
Adel  DOB 24-Jan-1922    Your procedure is scheduled on:  12-20-2017   Report to [facility] Main  Entrance,  Report to admitting at 8:00 AM    Call this number if you have problems the morning of surgery [phone]                Remember: Do not eat food or drink liquids :After Midnight.     Take these medicines the morning of surgery with A SIP OF WATER:  ISOSORBIDE, PANTOPRAZOLE(PROTONIX), ULORIC, MYRBETRIQ,GABAPENTIN                                                                                                                            COMBIVENT INHALER and ProAir Inhaler, ALBUTEROL INHALER AND NEBULIZER AS NEEDED                                You may not have any metal on your body including hair pins and              piercings               Do not wear jewelry, make-up, lotions, powders or perfumes, deodorant                          Men may shave face and neck.   Do not bring valuables to the hospital. [place].  Contacts, dentures or bridgework may not be worn into surgery.  Leave suitcase in the car. After surgery it may be brought to your room.     Patients discharged the day of surgery will not be allowed to drive home.  Name and phone number of your driver:  [employer] [place] [employer]- caregiver [phone]  Special Instructions: N/A              Please read over the following fact sheets you were given: _____________________________________________________________________             [facility] - Preparing for Surgery Before surgery, you can play an important role.  Because skin is not sterile, your skin needs to be as free of germs as possible.  You can reduce the number of germs on your skin by washing with CHG (chlorahexidine gluconate) soap before surgery.  CHG is an antiseptic cleaner which kills germs and bonds with the skin to continue killing germs even after  washing. Please DO NOT use if you have an allergy to CHG or antibacterial soaps.  If your skin becomes reddened/irritated stop using the CHG and inform your nurse when you arrive at Short Stay. Do not shave (including legs and underarms) for at least 48 hours  prior to the first CHG shower.  You may shave your face/neck. Please follow these instructions carefully:  1.  Shower with CHG Soap the night before surgery and the  morning of Surgery.  2.  If you choose to wash your hair, wash your hair first as usual with your  normal  shampoo.  3.  After you shampoo, rinse your hair and body thoroughly to remove the  shampoo.                                        4.  Use CHG as you would any other liquid soap.  You can apply chg directly  to the skin and wash                       Gently with a scrungie or clean washcloth.  5.  Apply the CHG Soap to your body ONLY FROM THE NECK DOWN.   Do not use on face/ open                           Wound or open sores. Avoid contact with eyes, ears mouth and genitals (private parts).                       Wash face,  Genitals (private parts) with your normal soap.             6.  Wash thoroughly, paying special attention to the area where your surgery  will be performed.  7.  Thoroughly rinse your body with warm water from the neck down.  8.  DO NOT shower/wash with your normal soap after using and rinsing off  the CHG Soap.             9.  Pat yourself dry with a clean towel.            10.  Wear clean pajamas.            11.  Place clean sheets on your bed the night of your first shower and do not  sleep with pets. Day of Surgery : Do not apply any lotions/deodorants the morning of surgery.  Please wear clean clothes to the hospital/surgery center.  FAILURE TO FOLLOW THESE INSTRUCTIONS MAY RESULT IN THE CANCELLATION OF YOUR SURGERY PATIENT SIGNATURE_________________________________  NURSE  SIGNATURE__________________________________  ________________________________________________________________________

## 2017-12-19 NOTE — H&P (Signed)
CC: I have bladder cancer.  HPI: Scott [name] is a 84 year-old male established patient who is here for bladder cancer.    [name] returns today in f/u for his history of recurrent UCa. 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 He has had some increased urgency with UUI as well as nocturia and frequency since the procedure. He has trace LE and heme on UA today. He has a good stream. He is on myrbetriq for OAB.     ALLERGIES: No Allergies    MEDICATIONS: Coumadin 2 mg tablet  Coumadin 2.5 mg tablet  Metoprolol Succinate 50 mg tablet, extended release 24 hr  Myrbetriq 25 mg tablet, extended release 24 hr 1 tablet PO Daily  Simvastatin 10 mg tablet  Albuterol Sulfate 2.5 mg/3 ml (0.083 %) vial, nebulizer  Donepezil Hcl 10 mg tablet  Ferrous Sulfate 325 mg (65 mg iron) tablet  Fluticasone Propionate 50 mcg/actuation spray, suspension  Folic Acid 1 mg tablet  Furosemide 40 mg tablet  Gabapentin 100 mg capsule  Isosorbide Dinitrate 30 mg tablet  Nitrostat 0.4 mg tablet, sublingual  Pantoprazole Sodium 40 mg tablet, delayed release  Sertraline Hcl 50 mg tablet  Uloric 40 mg tablet  Zocor     GU PSH: Bladder Instill AntiCA Agent - 2017 Cysto Bladder Ureth Biopsy - 2013, 2013 Cystoscopy - 11/19/2016, 2018, 2017 Cystoscopy Fulguration - 12/09/2016, 2018, 01/20/2016 Cystoscopy TURBT <2 cm - 2018 Cystoscopy TURBT 2-5 cm - 12/09/2016, 2017, 2013 Cystoscopy TURP - 2013      PSH Notes: Bladder Injection Of Cancer Treatment, Cystoscopy With Fulguration Medium Lesion (2-5cm), Amputation Of Leg Above Knee, Transurethral Resection Of Prostate (TURP), Cystoscopy With Biopsy, Cystoscopy With Biopsy, Cystoscopy With Fulguration Medium Lesion (2-5cm), No Surgical Problems   NON-GU PSH: Amputate Leg At Thigh - 2017    GU PMH: Personal Hx Urinary Tract Infections, He completed treatment for a UTI 2 days ago. I will send a UA an culture to confirm that it has cleared. -  04/08/2017 Urge incontinence (Worsening) - 01/07/2017 Bladder Cancer, Unspec, He had a recurrence in his prostatic urethra at the bladder. - 06/11/2016 Urethral Cancer, He has a small papillary urethral cancer. - 2017 Bladder Cancer Trigone , Malignant neoplasm of trigone of bladder - 2017 Urinary Urgency, Urinary urgency - 2017 Gross hematuria, Gross hematuria - 2017 History of bladder cancer, History of bladder cancer - 2015 BPH w/LUTS, Benign prostatic hyperplasia with urinary obstruction - 2014      PMH Notes:  2012-08-11 14:20:22 - Note: Heart Block  2011-09-20 12:53:15 - Note: Arthritis  He was treated for pneumonia in mid July 2017.    NON-GU PMH: Personal history of other diseases of the circulatory system, History of congestive heart disease - 2017, History of hypertension, - 2014 Acute embolism and thrombosis of unspecified deep veins of unspecified distal lower extremity, Deep vein thrombosis of distal lower extremity - 6599 Chronic systolic (congestive) heart failure, Chronic systolic congestive heart failure - 2014 DVT, History, H/O blood clots - 2014 Gout, Gout - 2014 Personal history of other diseases of the nervous system and sense organs, History of glaucoma - 2014 Personal history of other endocrine, nutritional and metabolic disease, History of hypercholesterolemia - 2014 Encounter for general adult medical examination without abnormal findings, Encounter for preventive health examination    FAMILY HISTORY: Death In The Family Father - Runs In Family Death In The Family Mother - 50 In [facility] Family Health Status Number -  Runs In Family   SOCIAL HISTORY: None    Notes: Former smoker, Marital History - Currently Married, Caffeine Use, Alcohol Use   REVIEW OF SYSTEMS:    GU Review Male:   Patient reports frequent urination. Patient denies hard to postpone urination, burning/ pain with urination, get up at night to urinate, leakage of urine, stream starts and stops,  trouble starting your stream, have to strain to urinate , erection problems, and penile pain.  Gastrointestinal (Upper):   Patient denies nausea, vomiting, and indigestion/ heartburn.  Gastrointestinal (Lower):   Patient denies diarrhea and constipation.  Constitutional:   Patient denies fever, night sweats, weight loss, and fatigue.  Skin:   Patient denies skin rash/ lesion and itching.  Eyes:   Patient denies blurred vision and double vision.  Ears/ Nose/ Throat:   Patient denies sore throat and sinus problems.  Hematologic/Lymphatic:   Patient denies swollen glands and easy bruising.  Cardiovascular:   Patient denies chest pains and leg swelling.  Respiratory:   Patient denies cough and shortness of breath.  Endocrine:   Patient denies excessive thirst.  Musculoskeletal:   Patient denies back pain and joint pain.  Neurological:   Patient denies headaches and dizziness.  Psychologic:   Patient denies depression and anxiety.   VITAL SIGNS:      12/02/2017 11:59 AM  Weight 160 lb / 72.57 kg  Height 66.5 in / 168.91 cm  BP 127/79 mmHg  Pulse 58 /min  Temperature 97.9 F / 36.6 C  BMI 25.4 kg/m   MULTI-SYSTEM PHYSICAL EXAMINATION:    Constitutional: Well-nourished. No physical deformities. Normally developed. Good grooming.  Respiratory: No labored breathing, no use of accessory muscles. CTA  Cardiovascular: Normal temperature, RRR without murmur.      PAST DATA REVIEWED:  Source Of History:  Patient  Urine Test Review:   Urinalysis   PROCEDURES:         Flexible Cystoscopy - 52000  Risks, benefits, and some of the potential complications of the procedure were discussed.    Meatus:  Normal size. Normal location. Normal condition.  Urethra:  Mild bulbous stricture. from his prior biopsy.   External Sphincter:  Normal.  Verumontanum:  Normal.  Prostate:  Borderline obstructing. Mild hyperplasia.  Bladder Neck:  Non-obstructing.  Ureteral Orifices:  Normal location. Normal  size. Normal shape. Effluxed clear urine.  Bladder:  Mild trabeculation. Erythematous mucosa, 2 patches on the posterior wall worrisome for CIS. No stones.      The procedure was well tolerated and there were no complications.         Urinalysis - 81003 Dipstick Dipstick Cont'd  Specimen: Voided Bilirubin: Neg  Color: Yellow Ketones: Neg  Appearance: Clear Blood: Trace Intact  Specific Gravity: 1.020 Protein: Neg  pH: 5.0 Urobilinogen: 0.2  Glucose: Neg Nitrites: Neg    Leukocyte Esterase: Trace    ASSESSMENT:      ICD-10 Details  1 GU:   Bladder Cancer Posterior - C67.4 He has lesions on the posterior wall that are suspicious for CIS. I will send a cytology but will get him set up for a cystoscopy with biopsy and fulguration under MAC. He will need to hold his warfarin.    PLAN:           Orders Labs Urine Cytology          Schedule Return Visit/Planned Activity: Next Available Appointment - Schedule Surgery

## 2017-12-19 NOTE — Telephone Encounter (Signed)
Encounter over a week old, closed

## 2017-12-20 ENCOUNTER — Encounter (HOSPITAL_COMMUNITY)
Admission: RE | Disposition: A | Discharge status: Home / Self Care | Payer: Self-pay | Source: Ambulatory Visit | Attending: Urology

## 2017-12-20 ENCOUNTER — Ambulatory Visit (HOSPITAL_COMMUNITY): Payer: Medicare Other | Admitting: Anesthesiology

## 2017-12-20 ENCOUNTER — Encounter (HOSPITAL_COMMUNITY): Payer: Self-pay | Admitting: *Deleted

## 2017-12-20 ENCOUNTER — Ambulatory Visit (HOSPITAL_COMMUNITY)
Admission: RE | Admit: 2017-12-20 | Discharge: 2017-12-20 | Disposition: A | Discharge status: Home / Self Care | Payer: Medicare Other | Source: Ambulatory Visit | Attending: Urology | Admitting: Urology

## 2017-12-20 ENCOUNTER — Other Ambulatory Visit: Payer: Self-pay

## 2017-12-20 DIAGNOSIS — C675 Malignant neoplasm of bladder neck: Secondary | ICD-10-CM | POA: Insufficient documentation

## 2017-12-20 DIAGNOSIS — K219 Gastro-esophageal reflux disease without esophagitis: Secondary | ICD-10-CM | POA: Diagnosis not present

## 2017-12-20 DIAGNOSIS — Z86718 Personal history of other venous thrombosis and embolism: Secondary | ICD-10-CM | POA: Insufficient documentation

## 2017-12-20 DIAGNOSIS — C672 Malignant neoplasm of lateral wall of bladder: Secondary | ICD-10-CM | POA: Diagnosis not present

## 2017-12-20 DIAGNOSIS — I252 Old myocardial infarction: Secondary | ICD-10-CM | POA: Insufficient documentation

## 2017-12-20 DIAGNOSIS — I5022 Chronic systolic (congestive) heart failure: Secondary | ICD-10-CM | POA: Diagnosis not present

## 2017-12-20 DIAGNOSIS — Z79899 Other long term (current) drug therapy: Secondary | ICD-10-CM | POA: Diagnosis not present

## 2017-12-20 DIAGNOSIS — N183 Chronic kidney disease, stage 3 (moderate): Secondary | ICD-10-CM | POA: Diagnosis not present

## 2017-12-20 DIAGNOSIS — I739 Peripheral vascular disease, unspecified: Secondary | ICD-10-CM | POA: Diagnosis not present

## 2017-12-20 DIAGNOSIS — Z87891 Personal history of nicotine dependence: Secondary | ICD-10-CM | POA: Insufficient documentation

## 2017-12-20 DIAGNOSIS — I110 Hypertensive heart disease with heart failure: Secondary | ICD-10-CM | POA: Insufficient documentation

## 2017-12-20 DIAGNOSIS — Z7901 Long term (current) use of anticoagulants: Secondary | ICD-10-CM | POA: Diagnosis not present

## 2017-12-20 DIAGNOSIS — F329 Major depressive disorder, single episode, unspecified: Secondary | ICD-10-CM | POA: Diagnosis not present

## 2017-12-20 DIAGNOSIS — C679 Malignant neoplasm of bladder, unspecified: Secondary | ICD-10-CM | POA: Diagnosis not present

## 2017-12-20 DIAGNOSIS — I5042 Chronic combined systolic (congestive) and diastolic (congestive) heart failure: Secondary | ICD-10-CM | POA: Diagnosis not present

## 2017-12-20 DIAGNOSIS — Z8551 Personal history of malignant neoplasm of bladder: Secondary | ICD-10-CM | POA: Diagnosis not present

## 2017-12-20 DIAGNOSIS — J449 Chronic obstructive pulmonary disease, unspecified: Secondary | ICD-10-CM | POA: Insufficient documentation

## 2017-12-20 DIAGNOSIS — I2510 Atherosclerotic heart disease of native coronary artery without angina pectoris: Secondary | ICD-10-CM | POA: Insufficient documentation

## 2017-12-20 DIAGNOSIS — D494 Neoplasm of unspecified behavior of bladder: Secondary | ICD-10-CM | POA: Diagnosis not present

## 2017-12-20 DIAGNOSIS — N3289 Other specified disorders of bladder: Secondary | ICD-10-CM | POA: Diagnosis not present

## 2017-12-20 HISTORY — PX: CYSTOSCOPY WITH BIOPSY: SHX5122

## 2017-12-20 LAB — PROTIME-INR
INR: 1.08
Prothrombin Time: 13.9 seconds (ref 11.4–15.2)

## 2017-12-20 SURGERY — CYSTOSCOPY, WITH BIOPSY
Anesthesia: Monitor Anesthesia Care

## 2017-12-20 MED ORDER — ACETAMINOPHEN 650 MG RE SUPP
650.0000 mg | RECTAL | Status: DC | PRN
  Filled 2017-12-20: qty 1

## 2017-12-20 MED ORDER — FENTANYL CITRATE (PF) 100 MCG/2ML IJ SOLN
INTRAMUSCULAR | Status: DC | PRN
  Administered 2017-12-20: 25 ug via INTRAVENOUS

## 2017-12-20 MED ORDER — CEFAZOLIN SODIUM-DEXTROSE 2-4 GM/100ML-% IV SOLN
2.0000 g | INTRAVENOUS | Status: DC
  Filled 2017-12-20: qty 100

## 2017-12-20 MED ORDER — STERILE WATER FOR IRRIGATION IR SOLN
Status: DC | PRN
  Administered 2017-12-20: 3000 mL

## 2017-12-20 MED ORDER — OXYCODONE HCL 5 MG PO TABS: 5.0000 mg | ORAL_TABLET | ORAL | Status: DC | PRN

## 2017-12-20 MED ORDER — MORPHINE SULFATE (PF) 4 MG/ML IV SOLN: 1.0000 mg | INTRAVENOUS | Status: DC | PRN

## 2017-12-20 MED ORDER — FENTANYL CITRATE (PF) 100 MCG/2ML IJ SOLN
INTRAMUSCULAR | Status: AC
  Filled 2017-12-20: qty 2

## 2017-12-20 MED ORDER — SODIUM CHLORIDE 0.9% FLUSH: 3.0000 mL | Freq: Two times a day (BID) | INTRAVENOUS | Status: DC

## 2017-12-20 MED ORDER — ONDANSETRON HCL 4 MG/2ML IJ SOLN
INTRAMUSCULAR | Status: DC | PRN
  Administered 2017-12-20: 4 mg via INTRAVENOUS

## 2017-12-20 MED ORDER — LIDOCAINE HCL URETHRAL/MUCOSAL 2 % EX GEL
CUTANEOUS | Status: AC
  Filled 2017-12-20: qty 5

## 2017-12-20 MED ORDER — LIDOCAINE HCL URETHRAL/MUCOSAL 2 % EX GEL
CUTANEOUS | Status: DC | PRN
  Administered 2017-12-20: 1

## 2017-12-20 MED ORDER — METOPROLOL SUCCINATE ER 50 MG PO TB24
50.0000 mg | ORAL_TABLET | Freq: Once | ORAL | Status: AC
  Administered 2017-12-20: 50 mg via ORAL
  Filled 2017-12-20: qty 1

## 2017-12-20 MED ORDER — PROPOFOL 500 MG/50ML IV EMUL
INTRAVENOUS | Status: DC | PRN
  Administered 2017-12-20: 100 ug/kg/min via INTRAVENOUS

## 2017-12-20 MED ORDER — SODIUM CHLORIDE 0.9 % IV SOLN: 250.0000 mL | INTRAVENOUS | Status: DC | PRN

## 2017-12-20 MED ORDER — PROPOFOL 10 MG/ML IV BOLUS
INTRAVENOUS | Status: DC | PRN
  Administered 2017-12-20: 10 mg via INTRAVENOUS

## 2017-12-20 MED ORDER — ACETAMINOPHEN 325 MG PO TABS: 650.0000 mg | ORAL_TABLET | ORAL | Status: DC | PRN

## 2017-12-20 MED ORDER — PROPOFOL 10 MG/ML IV BOLUS
INTRAVENOUS | Status: AC
  Filled 2017-12-20: qty 20

## 2017-12-20 MED ORDER — SODIUM CHLORIDE 0.9% FLUSH: 3.0000 mL | INTRAVENOUS | Status: DC | PRN

## 2017-12-20 MED ORDER — LACTATED RINGERS IV SOLN
INTRAVENOUS | Status: DC
  Administered 2017-12-20: 08:00:00 via INTRAVENOUS

## 2017-12-20 MED ORDER — PROPOFOL 10 MG/ML IV BOLUS
INTRAVENOUS | Status: AC
  Filled 2017-12-20: qty 40

## 2017-12-20 SURGICAL SUPPLY — 16 items
BAG URINE DRAINAGE (UROLOGICAL SUPPLIES) IMPLANT
BAG URO CATCHER STRL LF (MISCELLANEOUS) IMPLANT
CATH FOLEY 3WAY 30CC 22FR (CATHETERS) IMPLANT
CATH URET 5FR 28IN OPEN ENDED (CATHETERS) IMPLANT
GLOVE SURG SS PI 8.0 STRL IVOR (GLOVE) ×2 IMPLANT
GOWN STRL REUS W/TWL XL LVL3 (GOWN DISPOSABLE) ×2 IMPLANT
HOLDER FOLEY CATH W/STRAP (MISCELLANEOUS) IMPLANT
LOOP CUT BIPOLAR 24F LRG (ELECTROSURGICAL) IMPLANT
MANIFOLD NEPTUNE II (INSTRUMENTS) ×2 IMPLANT
PACK CYSTO (CUSTOM PROCEDURE TRAY) ×2 IMPLANT
SET ASPIRATION TUBING (TUBING) IMPLANT
SUT ETHILON 3 0 PS 1 (SUTURE) IMPLANT
SYR 30ML LL (SYRINGE) IMPLANT
SYRINGE IRR TOOMEY STRL 70CC (SYRINGE) IMPLANT
TUBING CONNECTING 10 (TUBING) ×2 IMPLANT
TUBING UROLOGY SET (TUBING) IMPLANT

## 2017-12-20 NOTE — Anesthesia Postprocedure Evaluation (Signed)
Anesthesia Post Note  Patient: Scott [name]  Procedure(s) Performed: CYSTOSCOPY WITH BIOPSY WITH FULGURATION (N/A )     Patient location during evaluation: PACU Anesthesia Type: MAC Level of consciousness: awake and alert Pain management: pain level controlled Vital Signs Assessment: post-procedure vital signs reviewed and stable Respiratory status: spontaneous breathing, nonlabored ventilation, respiratory function stable and patient connected to nasal cannula oxygen Cardiovascular status: stable and blood pressure returned to baseline Postop Assessment: no apparent nausea or vomiting Anesthetic complications: no    Last Vitals:  Vitals:   12/20/17 1045 12/20/17 1100  BP: (!) 150/75 (!) 148/86  Pulse: 61 65  Resp: 17 20  Temp:  (!) 36.4 C  SpO2: 97% 91%    Last Pain:  Vitals:   12/20/17 1100  TempSrc:   PainSc: 0-No pain                 Tiajuana Amass

## 2017-12-20 NOTE — Interval H&P Note (Signed)
History and Physical Interval Note: labs reviewed.  INR normal.   12/20/2017 9:36 AM  Scott [name]  has presented today for surgery, with the diagnosis of HISTORY OF BLADDER CANCER  The various methods of treatment have been discussed with the patient and family. After consideration of risks, benefits and other options for treatment, the patient has consented to  Procedure(s): CYSTOSCOPY WITH BIOPSY WITH FULGURATION (N/A) as a surgical intervention .  The patient's history has been reviewed, patient examined, no change in status, stable for surgery.  I have reviewed the patient's chart and labs.  Questions were answered to the patient's satisfaction.     [name]

## 2017-12-20 NOTE — Anesthesia Preprocedure Evaluation (Addendum)
Anesthesia Evaluation  Patient identified by MRN, date of birth, ID band Patient awake    Reviewed: Allergy & Precautions, NPO status , Patient's Chart, lab work & pertinent test results  Airway Mallampati: II  TM Distance: >3 FB Neck ROM: Full    Dental  (+) Dental Advisory Given   Pulmonary COPD, former smoker,    breath sounds clear to auscultation       Cardiovascular hypertension, Pt. on medications and Pt. on home beta blockers + CAD, + Past MI, + Peripheral Vascular Disease and +CHF  + dysrhythmias  Rhythm:Regular Rate:Normal  2018 Echo: - Compared to a prior study in 2016, the LVEF is lower at 35-40%  with global hypokinesis and incoordinate septal motion. There is  moderate pulmonary hypertension with an RVSP of 62 mmHg and  dilated IVC.   Neuro/Psych Depression negative neurological ROS     GI/Hepatic Neg liver ROS, GERD  ,  Endo/Other  negative endocrine ROS  Renal/GU CRFRenal disease     Musculoskeletal  (+) Arthritis ,   Abdominal   Peds  Hematology negative hematology ROS (+)   Anesthesia Other Findings   Reproductive/Obstetrics                             Lab Results  Component Value Date   WBC 3.8 (L) 12/15/2017   HGB 15.2 12/15/2017   HCT 46.9 12/15/2017   MCV 85.4 12/15/2017   PLT 156 12/15/2017   Lab Results  Component Value Date   CREATININE 1.33 (H) 12/15/2017   BUN 26 (H) 12/15/2017   NA 141 12/15/2017   K 3.8 12/15/2017   CL 101 12/15/2017   CO2 29 12/15/2017    Anesthesia Physical Anesthesia Plan  ASA: III  Anesthesia Plan: MAC   Post-op Pain Management:    Induction: Intravenous  PONV Risk Score and Plan: 1 and Propofol infusion, Ondansetron and Treatment may vary due to age or medical condition  Airway Management Planned: Natural Airway and Simple Face Mask  Additional Equipment:   Intra-op Plan:   Post-operative Plan:   Informed  Consent: I have reviewed the patients History and Physical, chart, labs and discussed the procedure including the risks, benefits and alternatives for the proposed anesthesia with the patient or authorized representative who has indicated his/her understanding and acceptance.     Plan Discussed with: CRNA  Anesthesia Plan Comments:         Anesthesia Quick Evaluation

## 2017-12-20 NOTE — Transfer of Care (Signed)
Immediate Anesthesia Transfer of Care Note  Patient: Scott [name]  Procedure(s) Performed: CYSTOSCOPY WITH BIOPSY WITH FULGURATION (N/A )  Patient Location: PACU  Anesthesia Type:MAC  Level of Consciousness: awake and patient cooperative  Airway & Oxygen Therapy: Patient Spontanous Breathing and Patient connected to face mask oxygen  Post-op Assessment: Report given to RN and Post -op Vital signs reviewed and stable  Post vital signs: Reviewed and stable  Last Vitals:  Vitals Value Taken Time  BP 143/71 12/20/2017 10:30 AM  Temp    Pulse 55 12/20/2017 10:35 AM  Resp 16 12/20/2017 10:35 AM  SpO2 100 % 12/20/2017 10:35 AM  Vitals shown include unvalidated device data.  Last Pain:  Vitals:   12/20/17 0748  TempSrc: Oral  PainSc: 0-No pain         Complications: No apparent anesthesia complications

## 2017-12-20 NOTE — Discharge Instructions (Signed)
CYSTOSCOPY HOME CARE INSTRUCTIONS  Activity: Rest for the remainder of the day.  Do not drive or operate equipment today.  You may resume normal activities in one to two days as instructed by your physician.   Meals: Drink plenty of liquids and eat light foods such as gelatin or soup this evening.  You may return to a normal meal plan tomorrow.  Return to Work: You may return to work in one to two days or as instructed by your physician.  Special Instructions / Symptoms: Call your physician if any of these symptoms occur:   -persistent or heavy bleeding  -bleeding which continues after first few urination  -large blood clots that are difficult to pass  -urine stream diminishes or stops completely  -fever equal to or higher than 101 degrees Farenheit.  -cloudy urine with a strong, foul odor  -severe pain  Females should always wipe from front to back after elimination.  You may feel some burning pain when you urinate.  This should disappear with time.  Applying moist heat to the lower abdomen or a hot tub bath may help relieve the pain. \  You may resume warfarin in 48hrs if there is no bleeding.   Patient Signature:  ________________________________________________________  Nurse's Signature:  ________________________________________________________

## 2017-12-20 NOTE — Op Note (Signed)
Procedure: 1.Cystoscopy with biopsy and fulguration of 1.5 cm right bladder neck tumor. 2.  Cystoscopy with biopsy and fulguration of 1.5 cm right lateral wall tumor in 1 cm right posterior wall tumor.  Preop diagnosis: Recurrent bladder cancer.  Postop diagnosis: Same.  Surgeon: Dr. [name].  Anesthesia: MAC and local.  Specimen: Biopsies from right bladder neck, right lateral wall and right posterior wall.  Drain: None.  EBL: None.  Complications: None.  Indications: Mr. 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  he returns now for biopsy and fulguration of recurrent disease on the posterior wall of the bladder noted on recent cystoscopy.  He also had cytologic atypia.  Procedure: He was taken operating room where he was given 2 g of Ancef.  He was placed in lithotomy position and sedation was given as indicated.  His perineum and genitalia were prepped with Betadine solution he was draped in usual sterile fashion.  His urethra was instilled with 10 mL of 2% lidocaine jelly.  Cystoscopy was performed using a 23 [place] scope and 30 degree lens.  Examination revealed a normal urethra.  The external sphincter was intact.  The prostatic urethra was approximately 4 cm in length prior resection and some regrowth laterally.  There was a approximately 1.5 cm papillary tumor that arose from the right bladder neck and proximal prostatic urethra that is not been detected on his office cystoscopy.  Inspection of the bladder demonstrate ureteral orifice ease in the normal anatomic position.  There were mucosal abnormalities on the right lateral wall and right posterior wall consistent with recurrent flat tumors.  There was some scars on the posterior wall and lateral wall as well from prior resection and fulguration.  After thorough inspection a cup biopsy forceps was used to remove  the 1.5 cm tumor from the right bladder neck and proximal prostatic urethra.  Once the tumor had been removed the base was fulgurated with a Bugbee electrode.  The cup biopsy forceps was then used to biopsy the right lateral wall lesion right posterior wall lesion.  The biopsy sites as well as surrounding abnormal mucosa in both lesions was generously fulgurated.  Once all abnormal mucosa had been fulgurated and hemostasis was secured, the bladder was drained and the cystoscope was removed.  He was taken down from lithotomy position and moved to recovery room in stable condition.  There were no complications.

## 2017-12-21 ENCOUNTER — Encounter (HOSPITAL_COMMUNITY): Payer: Self-pay | Admitting: Urology

## 2017-12-28 ENCOUNTER — Other Ambulatory Visit: Payer: Self-pay | Admitting: Family Medicine

## 2018-01-03 ENCOUNTER — Other Ambulatory Visit: Payer: Self-pay | Admitting: Family Medicine

## 2018-01-11 ENCOUNTER — Other Ambulatory Visit: Payer: Self-pay | Admitting: Family Medicine

## 2018-01-11 ENCOUNTER — Other Ambulatory Visit: Payer: Self-pay | Admitting: Nurse Practitioner

## 2018-01-11 ENCOUNTER — Encounter: Payer: Self-pay | Admitting: Pharmacist Clinician (PhC)/ Clinical Pharmacy Specialist

## 2018-01-11 DIAGNOSIS — I5042 Chronic combined systolic (congestive) and diastolic (congestive) heart failure: Secondary | ICD-10-CM

## 2018-01-13 ENCOUNTER — Ambulatory Visit: Payer: Medicare Other | Admitting: Urology

## 2018-01-18 ENCOUNTER — Ambulatory Visit (INDEPENDENT_AMBULATORY_CARE_PROVIDER_SITE_OTHER): Payer: Medicare Other | Admitting: Pharmacist Clinician (PhC)/ Clinical Pharmacy Specialist

## 2018-01-18 DIAGNOSIS — I4891 Unspecified atrial fibrillation: Secondary | ICD-10-CM | POA: Diagnosis not present

## 2018-01-18 DIAGNOSIS — Z86711 Personal history of pulmonary embolism: Secondary | ICD-10-CM

## 2018-01-18 LAB — COAGUCHEK XS/INR WAIVED
INR: 3.6 — ABNORMAL HIGH (ref 0.9–1.1)
Prothrombin Time: 42.8 s

## 2018-01-18 NOTE — Patient Instructions (Signed)
Description   No warfarin tomorrow and eat a high vitamin K food today and tomorrow.  INR today is 3.6  (goal is 2-3)  A little thin today

## 2018-01-20 ENCOUNTER — Ambulatory Visit (INDEPENDENT_AMBULATORY_CARE_PROVIDER_SITE_OTHER): Payer: Medicare Other | Admitting: Urology

## 2018-01-20 ENCOUNTER — Other Ambulatory Visit: Payer: Self-pay | Admitting: Family Medicine

## 2018-01-20 DIAGNOSIS — C670 Malignant neoplasm of trigone of bladder: Secondary | ICD-10-CM | POA: Diagnosis not present

## 2018-01-20 DIAGNOSIS — C672 Malignant neoplasm of lateral wall of bladder: Secondary | ICD-10-CM

## 2018-01-20 DIAGNOSIS — C674 Malignant neoplasm of posterior wall of bladder: Secondary | ICD-10-CM

## 2018-01-25 ENCOUNTER — Other Ambulatory Visit: Payer: Self-pay | Admitting: Family Medicine

## 2018-02-01 ENCOUNTER — Other Ambulatory Visit: Payer: Self-pay | Admitting: Family Medicine

## 2018-02-07 ENCOUNTER — Other Ambulatory Visit: Payer: Self-pay | Admitting: Family Medicine

## 2018-02-09 ENCOUNTER — Telehealth: Payer: Self-pay | Admitting: Family Medicine

## 2018-02-10 NOTE — Telephone Encounter (Signed)
appt scheduled Pt's caregiver and son notified

## 2018-02-13 ENCOUNTER — Encounter: Payer: Self-pay | Admitting: Family Medicine

## 2018-02-13 ENCOUNTER — Ambulatory Visit (INDEPENDENT_AMBULATORY_CARE_PROVIDER_SITE_OTHER): Payer: Medicare Other | Admitting: Family Medicine

## 2018-02-13 VITALS — BP 155/83 | HR 56 | Temp 97.5°F

## 2018-02-13 DIAGNOSIS — I4891 Unspecified atrial fibrillation: Secondary | ICD-10-CM

## 2018-02-13 DIAGNOSIS — Z23 Encounter for immunization: Secondary | ICD-10-CM | POA: Diagnosis not present

## 2018-02-13 LAB — COAGUCHEK XS/INR WAIVED
INR: 2.1 — ABNORMAL HIGH (ref 0.9–1.1)
Prothrombin Time: 25.4 s

## 2018-02-13 NOTE — Progress Notes (Signed)
BP (!) 155/83   Pulse (!) 56   Temp (!) 97.5 F (36.4 C) (Oral)   SpO2 90%    Subjective:    Patient ID: Scott [name], male    DOB: 25-Dec-1921, 84 y.o.   MRN: [id number]  HPI: Scott [name] is a 84 y.o. male presenting on 02/13/2018 for Coagulation Disorder; Wheezing (x 1 week); and Shortness of Breath (on and off)   HPI A. fib with RVR recheck Patient is coming in for Coumadin recheck and for A. fib recheck.  He says that he is not having any bleeding and feels like he is doing well and has been continuing on his dose.  He was very elevated last time we had him hold and lowered the dose and his INR was 2.1 today.  Patient also comes in with his nurse caregiver who says that some family members were concerned about him wheezing some but she says that she does not feel like she is noticed it more in just feels like he has not always been getting his evening breathing treatments like he should and that is why he is wheezing some more in the evenings.  Today here in the office he denies any shortness of breath or wheezing and says that he got a breathing treatment this morning and he is feeling great and denies any coughing or fevers or chills today here in the office.  His nurse caregiver felt the same way but it was just missed treatments that caused that.  Relevant past medical, surgical, family and social history reviewed and updated as indicated. Interim medical history since our last visit reviewed. Allergies and medications reviewed and updated.  Review of Systems  Constitutional: Negative for chills and fever.  Eyes: Negative for visual disturbance.  Respiratory: Negative for shortness of breath and wheezing.   Cardiovascular: Negative for chest pain and leg swelling.  Musculoskeletal: Negative for back pain and gait problem.  Skin: Negative for rash.  All other systems reviewed and are negative.   Per HPI unless specifically indicated above   Allergies as of 02/13/2018    Reactions   Lipitor [atorvastatin] Other (See Comments)   unknown      Medication List        Accurate as of 02/13/18 11:42 AM. Always use your most recent med list.          COMBIVENT RESPIMAT 20-100 MCG/ACT Aers respimat Generic drug:  Ipratropium-Albuterol 1 PUFF EVERY 4 HOURS AS NEEDED FOR COUGH, WHEEZING, OR SHORTNESS OF BREATH   donepezil 10 MG tablet Commonly known as:  ARICEPT TAKE ONE TABLET AT BEDTIME   febuxostat 40 MG tablet Commonly known as:  ULORIC TAKE 1 TABLET DAILY   FEROSUL 325 (65 FE) MG tablet Generic drug:  ferrous sulfate Take 1 tablet (325 mg total) by mouth daily with breakfast.   fluticasone 50 MCG/ACT nasal spray Commonly known as:  FLONASE Place 1 spray into both nostrils daily as needed for allergies or rhinitis.   folic acid 1 MG tablet Commonly known as:  FOLVITE TAKE 1 TABLET DAILY   furosemide 40 MG tablet Commonly known as:  LASIX Take 1 tablet (40 mg total) by mouth 2 (two) times daily.   gabapentin 100 MG capsule Commonly known as:  NEURONTIN Take 2 capsules (200 mg total) by mouth 2 (two) times daily.   guaiFENesin 600 MG 12 hr tablet Commonly known as:  MUCINEX Take 600 mg by mouth daily as needed (congestion).  isosorbide dinitrate 30 MG tablet Commonly known as:  ISORDIL TAKE 1 TABLET ONCE A DAY   latanoprost 0.005 % ophthalmic solution Commonly known as:  XALATAN Place 1 drop into both eyes at bedtime.   metoprolol succinate 50 MG 24 hr tablet Commonly known as:  TOPROL-XL TAKE 1 TABLET DAILY   MYRBETRIQ 25 MG Tb24 tablet Generic drug:  mirabegron ER Take 25 mg by mouth daily.   nitroGLYCERIN 0.4 MG SL tablet Commonly known as:  NITROSTAT Place 1 tablet (0.4 mg total) under the tongue every 5 (five) minutes as needed for chest pain (up to 3 doses).   pantoprazole 40 MG tablet Commonly known as:  PROTONIX TAKE 1 TABLET DAILY   PROAIR HFA 108 (90 Base) MCG/ACT inhaler Generic drug:  albuterol 2 PUFFS  EVERY 6 HOURS AS NEEDED FOR WHEEZING OR SHORTNESS OF BREATH   sertraline 50 MG tablet Commonly known as:  ZOLOFT TAKE 1 TABLET IN THE MORNING   simvastatin 10 MG tablet Commonly known as:  ZOCOR Take 1 tablet (10 mg total) by mouth every evening.   Spacer/Aero Chamber [employer] Use with Albuterol HFA inhaler as directed   warfarin 2.5 MG tablet Commonly known as:  COUMADIN Take as directed by the anticoagulation clinic. If you are unsure how to take this medication, talk to your nurse or doctor. Original instructions:  Take 5 mg Friday March 29, then take 2.5 mg daily except 5 mg each Monday.          Objective:    BP (!) 155/83   Pulse (!) 56   Temp (!) 97.5 F (36.4 C) (Oral)   SpO2 90%   Wt Readings from Last 3 Encounters:  12/20/17 163 lb (73.9 kg)  07/07/17 160 lb (72.6 kg)  02/15/17 160 lb (72.6 kg)    Physical Exam  Constitutional: He is oriented to person, place, and time. He appears well-developed and well-nourished. No distress.  Eyes: Conjunctivae are normal. No scleral icterus.  Neck: Neck supple. No thyromegaly present.  Cardiovascular: Normal rate, regular rhythm, normal heart sounds and intact distal pulses.  No murmur heard. Pulmonary/Chest: Effort normal and breath sounds normal. No respiratory distress. He has no wheezes. He has no rales.  Musculoskeletal: Normal range of motion. He exhibits no edema.  Lymphadenopathy:    He has no cervical adenopathy.  Neurological: He is alert and oriented to person, place, and time. Coordination normal.  Skin: Skin is warm and dry. No rash noted. He is not diaphoretic.  Psychiatric: He has a normal mood and affect. His behavior is normal.  Nursing note and vitals reviewed.   Description   Continue current dose of 5 mg(2 pills) on Mondays and Fridays and 2.5 mg (1 pill) the rest of the week  INR today is 2.1  (goal is 2-3)  A little thin today        Assessment & Plan:   Problem List Items Addressed  This Visit      Cardiovascular and Mediastinum   Atrial fibrillation with RVR ([place]) - Primary   Relevant Orders   CoaguChek XS/INR Waived    Other Visit Diagnoses    Encounter for immunization       Relevant Orders   Flu vaccine HIGH DOSE PF (Completed)      Follow up plan: Return if symptoms worsen or fail to improve.  Counseling provided for all of the vaccine components Orders Placed This Encounter  Procedures  . CoaguChek XS/INR [employer]  Caryl Pina, MD [facility] 02/13/2018, 11:42 AM

## 2018-02-15 ENCOUNTER — Encounter: Payer: Self-pay | Admitting: Pharmacist Clinician (PhC)/ Clinical Pharmacy Specialist

## 2018-02-28 ENCOUNTER — Telehealth: Payer: Self-pay | Admitting: Family Medicine

## 2018-02-28 DIAGNOSIS — Z89519 Acquired absence of unspecified leg below knee: Secondary | ICD-10-CM

## 2018-02-28 DIAGNOSIS — R2681 Unsteadiness on feet: Secondary | ICD-10-CM

## 2018-02-28 DIAGNOSIS — S88119A Complete traumatic amputation at level between knee and ankle, unspecified lower leg, initial encounter: Secondary | ICD-10-CM

## 2018-02-28 DIAGNOSIS — R531 Weakness: Secondary | ICD-10-CM

## 2018-02-28 NOTE — Telephone Encounter (Signed)
Please send patient for rehab at requested location

## 2018-02-28 NOTE — Telephone Encounter (Signed)
What type of referral do you need? Physical therapy to learn how to walk  Have you been seen at our office for this problem? no (If no, schedule them an appointment.  They will need to be seen before a referral can be done.)  Is there a particular doctor or location that you prefer? [facility] [place]  Patient notified that referrals can take up to a week or longer to process. If they haven't heard anything within a week they should call back and speak with the referral department.

## 2018-03-01 NOTE — Telephone Encounter (Signed)
Referral placed.

## 2018-03-03 ENCOUNTER — Other Ambulatory Visit: Payer: Self-pay | Admitting: Family Medicine

## 2018-03-07 DIAGNOSIS — R2681 Unsteadiness on feet: Secondary | ICD-10-CM | POA: Diagnosis not present

## 2018-03-07 DIAGNOSIS — R531 Weakness: Secondary | ICD-10-CM | POA: Diagnosis not present

## 2018-03-07 DIAGNOSIS — S88119A Complete traumatic amputation at level between knee and ankle, unspecified lower leg, initial encounter: Secondary | ICD-10-CM | POA: Diagnosis not present

## 2018-03-14 DIAGNOSIS — R531 Weakness: Secondary | ICD-10-CM | POA: Diagnosis not present

## 2018-03-14 DIAGNOSIS — R2681 Unsteadiness on feet: Secondary | ICD-10-CM | POA: Diagnosis not present

## 2018-03-14 DIAGNOSIS — S88119A Complete traumatic amputation at level between knee and ankle, unspecified lower leg, initial encounter: Secondary | ICD-10-CM | POA: Diagnosis not present

## 2018-03-15 ENCOUNTER — Encounter: Payer: Self-pay | Admitting: Cardiology

## 2018-03-15 ENCOUNTER — Ambulatory Visit (INDEPENDENT_AMBULATORY_CARE_PROVIDER_SITE_OTHER): Payer: Medicare Other | Admitting: Cardiology

## 2018-03-15 ENCOUNTER — Encounter: Payer: Medicare Other | Admitting: Pharmacist Clinician (PhC)/ Clinical Pharmacy Specialist

## 2018-03-15 VITALS — BP 122/80 | HR 60 | Ht 67.0 in

## 2018-03-15 DIAGNOSIS — I5032 Chronic diastolic (congestive) heart failure: Secondary | ICD-10-CM | POA: Diagnosis not present

## 2018-03-15 DIAGNOSIS — I4891 Unspecified atrial fibrillation: Secondary | ICD-10-CM | POA: Diagnosis not present

## 2018-03-15 DIAGNOSIS — I10 Essential (primary) hypertension: Secondary | ICD-10-CM | POA: Diagnosis not present

## 2018-03-15 NOTE — Patient Instructions (Signed)
Medication Instructions:  Continue current medications  If you need a refill on your cardiac medications before your next appointment, please call your pharmacy.  Labwork: None ordered   If you have labs (blood work) drawn today and your tests are completely normal, you will receive your results only by: . MyChart Message (if you have MyChart) OR . A paper copy in the mail If you have any lab test that is abnormal or we need to change your treatment, we will call you to review the results.  Testing/Procedures: None ordered  Follow-Up: You will need a follow up appointment in 1 Year.  Please call our office 2 months in advance([phone]) to schedule the (1 Year) appointment.  You may see  DR Hochrein,  or one of the following Advanced Practice Providers on your designated Care Team:    . Kathryn Lawrence, DNP, ANP . Rhonda Barrett, PA-C  . Luke Kilroy, PA-C  . Hao Meng, PA-C . Angela Duke, PA-C  At [facility], you and your health needs are our priority.  As part of our continuing mission to provide you with exceptional heart care, we have created designated Provider Care Teams.  These Care Teams include your primary Cardiologist (physician) and Advanced Practice Providers (APPs -  Physician Assistants and Nurse Practitioners) who all work together to provide you with the care you need, when you need it.  Thank you for choosing [facility] at [facility]!!       

## 2018-03-15 NOTE — Progress Notes (Signed)
Cardiology Office Note   Date:  03/15/2018   ID:  Scott [name], DOB 1922/03/18, MRN [id number]  PCP:  Dettinger, Fransisca Kaufmann, MD  Cardiologist:   Minus Breeding, MD    Chief Complaint  Patient presents with  . Atrial Fibrillation      History of Present Illness: Scott [name] is a 84 y.o. male who presents for follow up of  NICM (06/2012 cath with patent cors - EF 40%,  2014 20-25%, 45% 2016), HTN, chronic LBBB, postural syncope related to diuretics, EtOH abuse, and DVT.  He presents for routine follow up.   Since I last saw him he has done well.  The patient denies any new symptoms such as chest discomfort, neck or arm discomfort. There has been no new shortness of breath, PND or orthopnea. There have been no reported palpitations, presyncope or syncope.  He is working with physical therapy to try to use a prosthesis.  He gets winded with this activity but otherwise is not complaining of increased shortness of breath.   Past Medical History:  Diagnosis Date  . Arthritis    "all over"  . Atrial fibrillation ([place])   . Benign localized prostatic hyperplasia with lower urinary tract symptoms (LUTS)   . Bradycardia 11/28/2015   Severe-HR in the 20s to 30s following intubation for urological procedure.  Marland Kitchen CAD- non obstructive disease by cath 3/14 cardiologist-  dr Patrina Andreas  . Chronic lower back pain   . COPD (chronic obstructive pulmonary disease) ([place])   . DDD (degenerative disc disease)   . Depression   . Dyspnea    w/ exertion and lying down (raise head)  . ETOH abuse   . GERD (gastroesophageal reflux disease)   . Glaucoma   . Glaucoma, both eyes   . History of bladder cancer urologist-- dr Jeffie Pollock   first dx 2012--  s/p TURBT's  . History of DVT of lower extremity 03/2013   bilateral   . History of pulmonary embolus (PE) 04/2013  . HTN (hypertension)   . Hyperlipidemia   . Hypertension   . LBBB (left bundle branch block)    chronic  . Non-ischemic cardiomyopathy ([place])    last echo 01-18-2017, ef 35-40%  . NSVT (nonsustained ventricular tachycardia) ([place])    a. NSVT 06/2012; NSVT also seen during 03/2013 adm. b. Med rx. Not candidate for ICD given adv age.  Marland Kitchen PAD (peripheral artery disease) ([place])   . 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 PVCs (premature ventricular contractions)   . PVD (peripheral vascular disease) ([facility])    Right ABI .75, Left .78 (2006)  . Syncope 06/2012   a. Felt to be postural syncope related to diuretics 06/2012.  Marland Kitchen Systolic and diastolic CHF, chronic (HCC) cardiologsit-  dr Kitrina Maurin   a. NICM - patent cors 06/2012, EF 40% at that time. b. 03/2013 eval: EF 20-25%.  . Vision loss, left eye    due to glaucoma  . Wears glasses     Past Surgical History:  Procedure Laterality Date  . AMPUTATION Left 10/16/2014   Procedure: AMPUTATION ABOVE KNEE- LEFT;  Surgeon: Mal Misty, MD;  Location: [facility];  Service: Vascular;  Laterality: Left;  . CARDIAC CATHETERIZATION  05-11-2004  DR [facility]   MINIMAL CORANARY PLAQUE/ NORMAL LVF/ EF 55%/  NON-OBSTRUCTIVE LAD 25%  . CARDIAC CATHETERIZATION  06/18/2012   dr Casandra Dallaire  mild luminal irregularities of coronaries, ef 45-50%  . CARDIOVASCULAR STRESS TEST  10-15-2010   LOW RISK NUCLEAR STUDY/ NO EVIDENCE OF ISCHEMIA/ NORMAL EF  . CATARACT EXTRACTION W/ INTRAOCULAR LENS  IMPLANT, BILATERAL Bilateral   . CYSTOSCOPY  10/07/2011   Procedure: CYSTOSCOPY;  Surgeon: Malka So, MD;  Location: [facility];  Service: Urology;  Laterality: N/A;  . CYSTOSCOPY WITH BIOPSY  03/14/2012   Procedure: CYSTOSCOPY WITH BIOPSY;  Surgeon: Malka So, MD;  Location: [facility];  Service: Urology;  Laterality: N/A;  WITH FULGURATION  . CYSTOSCOPY WITH BIOPSY N/A 12/09/2016   Procedure: CYSTOSCOPY WITH BIOPSY AND FULGURATION;  Surgeon: [name], MD;  Location: [facility];  Service: Urology;  Laterality: N/A;  .  CYSTOSCOPY WITH BIOPSY N/A 12/20/2017   Procedure: CYSTOSCOPY WITH BIOPSY WITH FULGURATION;  Surgeon: [name], MD;  Location: [facility];  Service: Urology;  Laterality: N/A;  . CYSTOSCOPY WITH FULGERATION N/A 01/20/2016   Procedure: CYSTOSCOPY, BIOPSY WITH FULGERATION OF URTHRAL TUMOR;  Surgeon: [name], MD;  Location: [facility];  Service: Urology;  Laterality: N/A;  . CYSTOSCOPY WITH FULGERATION N/A 06/01/2016   Procedure: CYSTOSCOPY WITH FULGERATION urethral tumors and bladder neck;  Surgeon: [name], MD;  Location: [facility];  Service: Urology;  Laterality: N/A;  . SHOULDER SURGERY  1970's  . TONSILLECTOMY    . TRANSTHORACIC ECHOCARDIOGRAM  01-18-2017   dr Arlana Canizales   moderate LVH, ef 35-40%, diffuse hypokinesis/  trivial AR and MR/  mild TR  . TRANSURETHRAL RESECTION OF BLADDER TUMOR  10/07/2011   Procedure: TRANSURETHRAL RESECTION OF BLADDER TUMOR (TURBT);  Surgeon: Malka So, MD;  Location: [facility];  Service: Urology;  Laterality: N/A;  . TRANSURETHRAL RESECTION OF BLADDER TUMOR N/A 07/10/2015   Procedure: TRANSURETHRAL RESECTION OF BLADDER TUMOR (TURBT), CYSTOSCOPY ;  Surgeon: [name], MD;  Location: [facility];  Service: Urology;  Laterality: N/A;  . TRANSURETHRAL RESECTION OF PROSTATE  03/14/2012   Procedure: TRANSURETHRAL RESECTION OF THE PROSTATE WITH GYRUS INSTRUMENTS;  Surgeon: Malka So, MD;  Location: [facility];  Service: Urology;  Laterality: N/A;     Current Outpatient Medications  Medication Sig Dispense Refill  . COMBIVENT RESPIMAT 20-100 MCG/ACT AERS respimat 1 PUFF EVERY 4 HOURS AS NEEDED FOR COUGH, WHEEZING, OR SHORTNESS OF BREATH (Patient taking differently: Inhale 1 puff into the lungs 2 (two) times daily. ) 4 g 4  . donepezil (ARICEPT) 10 MG tablet TAKE ONE TABLET AT BEDTIME 30 tablet 4  . febuxostat (ULORIC) 40 MG tablet TAKE 1 TABLET DAILY 30 tablet 5  . FEROSUL 325 (65 Fe) MG tablet Take 1 tablet (325 mg total) by mouth daily with breakfast.  (Patient taking differently: Take 325 mg by mouth daily. ) 30 tablet 4  . fluticasone (FLONASE) 50 MCG/ACT nasal spray Place 1 spray into both nostrils daily as needed for allergies or rhinitis. 16 g 2  . folic acid (FOLVITE) 1 MG tablet TAKE 1 TABLET DAILY 30 tablet 5  . furosemide (LASIX) 40 MG tablet Take 1 tablet (40 mg total) by mouth 2 (two) times daily. 60 tablet 3  . gabapentin (NEURONTIN) 100 MG capsule Take 2 capsules (200 mg total) by mouth 2 (two) times daily. 60 capsule 0  . guaiFENesin (MUCINEX) 600 MG 12 hr tablet Take 600 mg by mouth daily as needed (congestion).    . isosorbide dinitrate (ISORDIL) 30 MG tablet TAKE 1 TABLET ONCE A DAY (Patient taking differently: Take  30 mg by mouth daily. ) 30 tablet 4  . latanoprost (XALATAN) 0.005 % ophthalmic solution Place 1 drop into both eyes at bedtime.    . metoprolol succinate (TOPROL-XL) 50 MG 24 hr tablet TAKE 1 TABLET DAILY 30 tablet 3  . MYRBETRIQ 25 MG TB24 tablet Take 25 mg by mouth daily.     . nitroGLYCERIN (NITROSTAT) 0.4 MG SL tablet Place 1 tablet (0.4 mg total) under the tongue every 5 (five) minutes as needed for chest pain (up to 3 doses). 25 tablet 3  . pantoprazole (PROTONIX) 40 MG tablet TAKE 1 TABLET DAILY 90 tablet 1  . PROAIR HFA 108 (90 Base) MCG/ACT inhaler 2 PUFFS EVERY 6 HOURS AS NEEDED FOR WHEEZING OR SHORTNESS OF BREATH 8.5 g 2  . sertraline (ZOLOFT) 50 MG tablet TAKE 1 TABLET IN THE MORNING 90 tablet 0  . simvastatin (ZOCOR) 10 MG tablet Take 1 tablet (10 mg total) by mouth every evening. 90 tablet 0  . warfarin (COUMADIN) 2.5 MG tablet Take 5 mg Friday March 29, then take 2.5 mg daily except 5 mg each Monday. (Patient taking differently: Take 2.5-5 mg by mouth See admin instructions. Take 2.5 mg by mouth once daily except on Mondays and Fridays take 5 mg once daily) 10 tablet 0  . Spacer/Aero Chamber Mouthpiece MISC Use with Albuterol HFA inhaler as directed 1 each 1   No current facility-administered  medications for this visit.     Allergies:   Lipitor [atorvastatin]    ROS:  Please see the history of present illness.   Otherwise, review of systems are positive for none.   All other systems are reviewed and negative.    PHYSICAL EXAM: VS:  BP 122/80   Pulse 60   Ht 5\' 7"  (1.702 m)   BMI 25.53 kg/m  , BMI Body mass index is 25.53 kg/m.  GEN:  No distress NECK:  No jugular venous distention at 90 degrees, waveform within normal limits, carotid upstroke brisk and symmetric, no bruits, no thyromegaly LYMPHATICS:  No cervical adenopathy LUNGS:  Clear to auscultation bilaterally BACK:  No CVA tenderness CHEST:  Unremarkable HEART:  S1 and S2 within normal limits, no S3, no S4, no clicks, no rubs, no murmurs ABD:  Positive bowel sounds normal in frequency in pitch, no bruits, no rebound, no guarding, unable to assess midline mass or bruit with the patient seated. EXT:  2 plus pulses throughout, moderate edema, no cyanosis no clubbing, left AKA.  SKIN:  No rashes no nodules NEURO:  Cranial nerves II through XII grossly intact, motor grossly intact throughout PSYCH:  Cognitively intact, oriented to person place and time    EKG:  EKG is not ordered today. Sinus rhythm, rate 71, left bundle branch block, first-degree AV block, leftward axis.  No change from previous.  Recent Labs: 12/15/2017: ALT 16; BUN 26; Creatinine, Ser 1.33; Hemoglobin 15.2; Platelets 156; Potassium 3.8; Sodium 141    Lipid Panel    Component Value Date/Time   CHOL 171 08/03/2016 1118   TRIG 151 (H) 08/03/2016 1118   TRIG 194 (H) 03/26/2014 1446   HDL 41 08/03/2016 1118   HDL 35 (L) 03/26/2014 1446   CHOLHDL 4.2 08/03/2016 1118   CHOLHDL 2.6 11/02/2012 1310   VLDL 44 (H) 11/02/2012 1310   LDLCALC 100 (H) 08/03/2016 1118   LDLCALC 42 05/17/2013 1101      Wt Readings from Last 3 Encounters:  12/20/17 163 lb (73.9 kg)  07/07/17 160  lb (72.6 kg)  02/15/17 160 lb (72.6 kg)      Other studies  Reviewed: Additional studies/ records that were reviewed today include: None Review of the above records demonstrates:     ASSESSMENT AND PLAN:   Chronic Diastolic heart failure - The patient seems to be euvolemic.  No change in therapy.  PVCs (premature ventricular contractions) -  He is not really noticing these.  No change in therapy.  HTN (hypertension) -  The blood pressure is at target.  No change in therapy.   DVT/PULMONARY EMBOLI-  He tolerates anticoagulation.  He will continue with meds as listed.  ATRIAL FIB -  Mr. BENJIMEN KELLEY has a CHA2DS2 - VASc score of 4 with a risk of stroke of 4%.   He has had reasonable rate control.  No change in therapy.    Current medicines are reviewed at length with the patient today.  The patient does not have concerns regarding medicines.  The following changes have been made:  none  Labs/ tests ordered today include: None No orders of the defined types were placed in this encounter.    Disposition:   Follow up in me in 12 months.    Signed, Minus Breeding, MD  03/15/2018 3:31 PM     [facility]

## 2018-03-16 ENCOUNTER — Other Ambulatory Visit: Payer: Self-pay | Admitting: Family Medicine

## 2018-03-17 ENCOUNTER — Other Ambulatory Visit: Payer: Self-pay | Admitting: Family Medicine

## 2018-03-17 DIAGNOSIS — R531 Weakness: Secondary | ICD-10-CM | POA: Diagnosis not present

## 2018-03-17 DIAGNOSIS — R2681 Unsteadiness on feet: Secondary | ICD-10-CM | POA: Diagnosis not present

## 2018-03-17 DIAGNOSIS — S88119A Complete traumatic amputation at level between knee and ankle, unspecified lower leg, initial encounter: Secondary | ICD-10-CM | POA: Diagnosis not present

## 2018-03-20 ENCOUNTER — Other Ambulatory Visit: Payer: Self-pay | Admitting: Family Medicine

## 2018-03-21 DIAGNOSIS — R2681 Unsteadiness on feet: Secondary | ICD-10-CM | POA: Diagnosis not present

## 2018-03-21 DIAGNOSIS — S88119A Complete traumatic amputation at level between knee and ankle, unspecified lower leg, initial encounter: Secondary | ICD-10-CM | POA: Diagnosis not present

## 2018-03-21 DIAGNOSIS — R531 Weakness: Secondary | ICD-10-CM | POA: Diagnosis not present

## 2018-03-21 NOTE — Telephone Encounter (Signed)
Last lipid 08/03/16

## 2018-03-22 ENCOUNTER — Ambulatory Visit (INDEPENDENT_AMBULATORY_CARE_PROVIDER_SITE_OTHER): Payer: Medicare Other | Admitting: Pharmacist Clinician (PhC)/ Clinical Pharmacy Specialist

## 2018-03-22 DIAGNOSIS — Z86711 Personal history of pulmonary embolism: Secondary | ICD-10-CM | POA: Diagnosis not present

## 2018-03-22 DIAGNOSIS — I4891 Unspecified atrial fibrillation: Secondary | ICD-10-CM | POA: Diagnosis not present

## 2018-03-22 LAB — COAGUCHEK XS/INR WAIVED
INR: 2 — ABNORMAL HIGH (ref 0.9–1.1)
Prothrombin Time: 23.6 s

## 2018-03-22 NOTE — Patient Instructions (Signed)
Description   Continue taking 5 mg(2 pills) on Mondays and Fridays and 2.5 mg (1 pill) the rest of the week  INR today is 2.0  (goal is 2-3)  Perfect reading today

## 2018-03-23 DIAGNOSIS — S88119A Complete traumatic amputation at level between knee and ankle, unspecified lower leg, initial encounter: Secondary | ICD-10-CM | POA: Diagnosis not present

## 2018-03-23 DIAGNOSIS — R531 Weakness: Secondary | ICD-10-CM | POA: Diagnosis not present

## 2018-03-23 DIAGNOSIS — R2681 Unsteadiness on feet: Secondary | ICD-10-CM | POA: Diagnosis not present

## 2018-03-24 ENCOUNTER — Ambulatory Visit (INDEPENDENT_AMBULATORY_CARE_PROVIDER_SITE_OTHER): Payer: Medicare Other | Admitting: Family

## 2018-03-24 ENCOUNTER — Encounter: Payer: Self-pay | Admitting: Family

## 2018-03-24 ENCOUNTER — Ambulatory Visit (INDEPENDENT_AMBULATORY_CARE_PROVIDER_SITE_OTHER): Payer: Medicare Other

## 2018-03-24 VITALS — BP 145/76 | HR 57 | Temp 97.4°F

## 2018-03-24 DIAGNOSIS — R0602 Shortness of breath: Secondary | ICD-10-CM

## 2018-03-24 DIAGNOSIS — R062 Wheezing: Secondary | ICD-10-CM

## 2018-03-24 DIAGNOSIS — I5042 Chronic combined systolic (congestive) and diastolic (congestive) heart failure: Secondary | ICD-10-CM

## 2018-03-24 NOTE — Progress Notes (Signed)
Subjective:    Patient ID: Scott [name], male    DOB: [birth year], 84 y.o.   MRN: [id number]  Chief Complaint  Patient presents with  . Shortness of Breath   Pt presents to the office today with wheezing and SOB at times. States he feels ok, but has noticed this chest tightness and SOB over the last few weeks. Family was concern that his O2 was 89-91%. After review in the chart his O2 over the last year in the office has been 90-93%.  He has CHF and taking Lasix 40 mg daily. Denies any edema. Saw his Cardiologists on 03/15/18 with a good report.   Family states his wheezing is improved when he uses his nebulizer and has been using TID.  Wheezing   This is a recurrent problem. The current episode started 1 to 4 weeks ago. The problem occurs intermittently. The problem has been waxing and waning. Associated symptoms include coughing (nonproductive) and shortness of breath. Pertinent negatives include no chills, ear pain, fever, headaches, sore throat or sputum production. He has tried OTC cough suppressant for the symptoms. The treatment provided mild relief. His past medical history is significant for heart failure. There is no history of asthma.      Review of Systems  Constitutional: Negative for chills and fever.  HENT: Negative for ear pain and sore throat.   Respiratory: Positive for cough (nonproductive), shortness of breath and wheezing. Negative for sputum production.   Neurological: Negative for headaches.       Objective:   Physical Exam Vitals signs reviewed.  Constitutional:      General: He is not in acute distress.    Appearance: He is well-developed.  HENT:     Head: Normocephalic.     Right Ear: External ear normal.     Left Ear: External ear normal.     Nose: Mucosal edema present.  Eyes:     General:        Right eye: No discharge.        Left eye: No discharge.     Pupils: Pupils are equal, round, and reactive to light.  Neck:     Musculoskeletal: Normal  range of motion and neck supple.     Thyroid: No thyromegaly.  Cardiovascular:     Rate and Rhythm: Regular rhythm. Bradycardia present.     Heart sounds: Normal heart sounds. No murmur.  Pulmonary:     Effort: Pulmonary effort is normal. No respiratory distress.     Breath sounds: Decreased breath sounds present. No wheezing.  Abdominal:     General: Bowel sounds are normal. There is no distension.     Palpations: Abdomen is soft.     Tenderness: There is no abdominal tenderness.  Musculoskeletal:     Comments: In wheelchair, has left AKA  Skin:    General: Skin is warm and dry.     Findings: No erythema or rash.  Neurological:     Mental Status: He is alert and oriented to person, place, and time.     Cranial Nerves: No cranial nerve deficit.     Deep Tendon Reflexes: Reflexes are normal and symmetric.  Psychiatric:        Behavior: Behavior normal.        Thought Content: Thought content normal.        Judgment: Judgment normal.     BP (!) 145/76   Pulse (!) 57   Temp (!) 97.4 F (36.3 C) (Oral)  SpO2 91% Comment: sitting, room air     Assessment & Plan:  [name] comes in today with chief complaint of Shortness of Breath   Diagnosis and orders addressed:  1. SOB (shortness of breath) - DG Chest 2 View; Future  2. Wheezing   3. Chronic combined systolic and diastolic heart failure (HCC) No edema or crackles noted   On exam patient seems stable X-ray negative for pneumonia or acute changes Continue nebulizer treatments as needed Keep follow up with PCP and return to the office if symptoms worsen or do not improve   Spent greater than 20 mins with patient and family answering questions about disease process and disease management.     Evelina Dun, FNP

## 2018-03-24 NOTE — Patient Instructions (Signed)
Bronchospasm, Adult Bronchospasm is a tightening of the airways going into the lungs. During an episode, it may be harder to breathe. You may cough, and you may make a whistling sound when you breathe (wheeze). This condition often affects people with asthma. What are the causes? This condition is caused by swelling and irritation in the airways. It can be triggered by:  An infection (common).  Seasonal allergies.  An allergic reaction.  Exercise.  Irritants. These include pollution, cigarette smoke, strong odors, aerosol sprays, and paint fumes.  Weather changes. Winds increase molds and pollens in the air. Cold air may cause swelling.  Stress and emotional upset.  What are the signs or symptoms? Symptoms of this condition include:  Wheezing. If the episode was triggered by an allergy, wheezing may start right away or hours later.  Nighttime coughing.  Frequent or severe coughing with a simple cold.  Chest tightness.  Shortness of breath.  Decreased ability to exercise.  How is this diagnosed? This condition is usually diagnosed with a review of your medical history and a physical exam. Tests, such as lung function tests, are sometimes done to look for other conditions. The need for a chest X-ray depends on where the wheezing occurs and whether it is the first time you have wheezed. How is this treated? This condition may be treated with:  Inhaled medicines. These open up the airways and help you breathe. They can be taken with an inhaler or a nebulizer device.  Corticosteroid medicines. These may be given for severe bronchospasm, usually when it is associated with asthma.  Avoiding triggers, such as irritants, infection, or allergies.  Follow these instructions at home: Medicines  Take over-the-counter and prescription medicines only as told by your health care provider.  If you need to use an inhaler or nebulizer to take your medicine, ask your health care  provider to explain how to use it correctly. If you were given a spacer, always use it with your inhaler. Lifestyle  Reduce the number of triggers in your home. To do this: ? Change your heating and air conditioning filter at least once a month. ? Limit your use of fireplaces and wood stoves. ? Do not smoke. Do not allow smoking in your home. ? Avoid using perfumes and fragrances. ? Get rid of pests, such as roaches and mice, and their droppings. ? Remove any mold from your home. ? Keep your house clean and dust free. Use unscented cleaning products. ? Replace carpet with wood, tile, or vinyl flooring. Carpet can trap dander and dust. ? Use allergy-proof pillows, mattress covers, and box spring covers. ? Wash bed sheets and blankets every week in hot water. Dry them in a dryer. ? Use blankets that are made of polyester or cotton. ? Wash your hands often. ? Do not allow pets in your bedroom.  Avoid breathing in cold air when you exercise. General instructions  Have a plan for seeking medical care. Know when to call your health care provider and local emergency services, and where to get emergency care.  Stay up to date on your immunizations.  When you have an episode of bronchospasm, stay calm. Try to relax and breathe more slowly.  If you have asthma, make sure you have an asthma action plan.  Keep all follow-up visits as told by your health care provider. This is important. Contact a health care provider if:  You have muscle aches.  You have chest pain.  The mucus that you   cough up (sputum) changes from clear or white to yellow, green, gray, or bloody.  You have a fever.  Your sputum gets thicker. Get help right away if:  Your wheezing and coughing get worse, even after you take your prescribed medicines.  It gets even harder to breathe.  You develop severe chest pain. Summary  Bronchospasm is a tightening of the airways going into the lungs.  During an episode of  bronchospasm, you may have a harder time breathing. You may cough and make a whistling sound when you breathe (wheeze).  Avoid exposure to triggers such as smoke, dust, mold, animal dander, and fragrances.  When you have an episode of bronchospasm, stay calm. Try to relax and breathe more slowly. This information is not intended to replace advice given to you by your health care provider. Make sure you discuss any questions you have with your health care provider. Document Released: 04/01/2003 Document Revised: 03/25/2016 Document Reviewed: 03/25/2016 Elsevier Interactive Patient Education  2017 Elsevier Inc.  

## 2018-03-28 DIAGNOSIS — R2681 Unsteadiness on feet: Secondary | ICD-10-CM | POA: Diagnosis not present

## 2018-03-28 DIAGNOSIS — S88119A Complete traumatic amputation at level between knee and ankle, unspecified lower leg, initial encounter: Secondary | ICD-10-CM | POA: Diagnosis not present

## 2018-03-28 DIAGNOSIS — R531 Weakness: Secondary | ICD-10-CM | POA: Diagnosis not present

## 2018-03-30 DIAGNOSIS — R531 Weakness: Secondary | ICD-10-CM | POA: Diagnosis not present

## 2018-03-30 DIAGNOSIS — S88119A Complete traumatic amputation at level between knee and ankle, unspecified lower leg, initial encounter: Secondary | ICD-10-CM | POA: Diagnosis not present

## 2018-03-30 DIAGNOSIS — R2681 Unsteadiness on feet: Secondary | ICD-10-CM | POA: Diagnosis not present

## 2018-04-03 ENCOUNTER — Other Ambulatory Visit: Payer: Self-pay | Admitting: Family Medicine

## 2018-04-04 DIAGNOSIS — S88119A Complete traumatic amputation at level between knee and ankle, unspecified lower leg, initial encounter: Secondary | ICD-10-CM | POA: Diagnosis not present

## 2018-04-04 DIAGNOSIS — R2681 Unsteadiness on feet: Secondary | ICD-10-CM | POA: Diagnosis not present

## 2018-04-04 DIAGNOSIS — R531 Weakness: Secondary | ICD-10-CM | POA: Diagnosis not present

## 2018-04-06 ENCOUNTER — Other Ambulatory Visit: Payer: Self-pay | Admitting: Family Medicine

## 2018-04-06 MED ORDER — ALBUTEROL SULFATE (2.5 MG/3ML) 0.083% IN NEBU: 2.5000 mg | INHALATION_SOLUTION | Freq: Four times a day (QID) | RESPIRATORY_TRACT | 1 refills | Status: DC | PRN

## 2018-04-06 NOTE — Addendum Note (Signed)
Addended by: Evelina Dun A on: 04/06/2018 02:52 PM   Modules accepted: Orders

## 2018-04-06 NOTE — Telephone Encounter (Signed)
Prescription sent to pharmacy.

## 2018-04-06 NOTE — Telephone Encounter (Signed)
Fax from [facility] RF Albuterol Sul 2.5 mg/3 ml Inhale 3 ml by neb q 4hr as needed for wheezing Medication not on current med list Please advise

## 2018-04-07 DIAGNOSIS — R2681 Unsteadiness on feet: Secondary | ICD-10-CM | POA: Diagnosis not present

## 2018-04-07 DIAGNOSIS — R531 Weakness: Secondary | ICD-10-CM | POA: Diagnosis not present

## 2018-04-07 DIAGNOSIS — S88119A Complete traumatic amputation at level between knee and ankle, unspecified lower leg, initial encounter: Secondary | ICD-10-CM | POA: Diagnosis not present

## 2018-04-11 DIAGNOSIS — R2681 Unsteadiness on feet: Secondary | ICD-10-CM | POA: Diagnosis not present

## 2018-04-11 DIAGNOSIS — S88119A Complete traumatic amputation at level between knee and ankle, unspecified lower leg, initial encounter: Secondary | ICD-10-CM | POA: Diagnosis not present

## 2018-04-11 DIAGNOSIS — R531 Weakness: Secondary | ICD-10-CM | POA: Diagnosis not present

## 2018-04-13 ENCOUNTER — Telehealth: Payer: Self-pay | Admitting: Family Medicine

## 2018-04-13 NOTE — Telephone Encounter (Signed)
If he continues to grow weaker or continues to have more coughing and wheezing then bring him in or take him to the emergency department.

## 2018-04-13 NOTE — Telephone Encounter (Signed)
Pt's caregiver made aware of provider feedback and voiced understanding.

## 2018-04-13 NOTE — Telephone Encounter (Signed)
Caregiver states pt is really weak, didn't eat yesterday, he is drinking water 2 liters, temp 98.1, coughing and wheezing. Caregiver also said he has been drinking liquor that was given to him by a family member and caregiver doesn't know how much he has had but didn't drink any yesterday. Any advice?

## 2018-04-14 ENCOUNTER — Other Ambulatory Visit: Payer: Self-pay | Admitting: Family Medicine

## 2018-04-18 DIAGNOSIS — S88119A Complete traumatic amputation at level between knee and ankle, unspecified lower leg, initial encounter: Secondary | ICD-10-CM | POA: Diagnosis not present

## 2018-04-18 DIAGNOSIS — R531 Weakness: Secondary | ICD-10-CM | POA: Diagnosis not present

## 2018-04-18 DIAGNOSIS — R2681 Unsteadiness on feet: Secondary | ICD-10-CM | POA: Diagnosis not present

## 2018-04-20 DIAGNOSIS — R2681 Unsteadiness on feet: Secondary | ICD-10-CM | POA: Diagnosis not present

## 2018-04-20 DIAGNOSIS — R531 Weakness: Secondary | ICD-10-CM | POA: Diagnosis not present

## 2018-04-20 DIAGNOSIS — S88119A Complete traumatic amputation at level between knee and ankle, unspecified lower leg, initial encounter: Secondary | ICD-10-CM | POA: Diagnosis not present

## 2018-04-25 DIAGNOSIS — R2681 Unsteadiness on feet: Secondary | ICD-10-CM | POA: Diagnosis not present

## 2018-04-25 DIAGNOSIS — S88119A Complete traumatic amputation at level between knee and ankle, unspecified lower leg, initial encounter: Secondary | ICD-10-CM | POA: Diagnosis not present

## 2018-04-25 DIAGNOSIS — R531 Weakness: Secondary | ICD-10-CM | POA: Diagnosis not present

## 2018-04-26 ENCOUNTER — Ambulatory Visit (INDEPENDENT_AMBULATORY_CARE_PROVIDER_SITE_OTHER): Payer: Medicare Other | Admitting: Pharmacist Clinician (PhC)/ Clinical Pharmacy Specialist

## 2018-04-26 DIAGNOSIS — I4891 Unspecified atrial fibrillation: Secondary | ICD-10-CM | POA: Diagnosis not present

## 2018-04-26 DIAGNOSIS — Z86711 Personal history of pulmonary embolism: Secondary | ICD-10-CM | POA: Diagnosis not present

## 2018-04-26 LAB — COAGUCHEK XS/INR WAIVED
INR: 2 — ABNORMAL HIGH (ref 0.9–1.1)
Prothrombin Time: 23.6 s

## 2018-04-26 NOTE — Patient Instructions (Signed)
Description   Continue taking 5 mg(2 pills) on Mondays and Fridays and 2.5 mg (1 pill) the rest of the week  INR today is 2.0  (goal is 2-3)  Perfect reading today

## 2018-04-27 DIAGNOSIS — R2681 Unsteadiness on feet: Secondary | ICD-10-CM | POA: Diagnosis not present

## 2018-04-27 DIAGNOSIS — R531 Weakness: Secondary | ICD-10-CM | POA: Diagnosis not present

## 2018-04-27 DIAGNOSIS — S88119A Complete traumatic amputation at level between knee and ankle, unspecified lower leg, initial encounter: Secondary | ICD-10-CM | POA: Diagnosis not present

## 2018-05-02 ENCOUNTER — Telehealth: Payer: Self-pay

## 2018-05-02 ENCOUNTER — Other Ambulatory Visit: Payer: Self-pay | Admitting: Family Medicine

## 2018-05-02 DIAGNOSIS — R531 Weakness: Secondary | ICD-10-CM | POA: Diagnosis not present

## 2018-05-02 DIAGNOSIS — R2681 Unsteadiness on feet: Secondary | ICD-10-CM | POA: Diagnosis not present

## 2018-05-02 DIAGNOSIS — S88119A Complete traumatic amputation at level between knee and ankle, unspecified lower leg, initial encounter: Secondary | ICD-10-CM | POA: Diagnosis not present

## 2018-05-02 DIAGNOSIS — I5042 Chronic combined systolic (congestive) and diastolic (congestive) heart failure: Secondary | ICD-10-CM

## 2018-05-04 DIAGNOSIS — S88119A Complete traumatic amputation at level between knee and ankle, unspecified lower leg, initial encounter: Secondary | ICD-10-CM | POA: Diagnosis not present

## 2018-05-04 DIAGNOSIS — R531 Weakness: Secondary | ICD-10-CM | POA: Diagnosis not present

## 2018-05-04 DIAGNOSIS — R2681 Unsteadiness on feet: Secondary | ICD-10-CM | POA: Diagnosis not present

## 2018-05-04 NOTE — Telephone Encounter (Signed)
x

## 2018-05-05 ENCOUNTER — Ambulatory Visit (INDEPENDENT_AMBULATORY_CARE_PROVIDER_SITE_OTHER): Payer: Medicare Other | Admitting: Urology

## 2018-05-05 DIAGNOSIS — C680 Malignant neoplasm of urethra: Secondary | ICD-10-CM | POA: Diagnosis not present

## 2018-05-05 DIAGNOSIS — Z8551 Personal history of malignant neoplasm of bladder: Secondary | ICD-10-CM | POA: Diagnosis not present

## 2018-05-09 DIAGNOSIS — S88119A Complete traumatic amputation at level between knee and ankle, unspecified lower leg, initial encounter: Secondary | ICD-10-CM | POA: Diagnosis not present

## 2018-05-09 DIAGNOSIS — R531 Weakness: Secondary | ICD-10-CM | POA: Diagnosis not present

## 2018-05-09 DIAGNOSIS — R2681 Unsteadiness on feet: Secondary | ICD-10-CM | POA: Diagnosis not present

## 2018-05-10 ENCOUNTER — Other Ambulatory Visit: Payer: Self-pay | Admitting: Urology

## 2018-05-10 ENCOUNTER — Other Ambulatory Visit: Payer: Self-pay | Admitting: Family Medicine

## 2018-05-10 ENCOUNTER — Telehealth: Payer: Self-pay | Admitting: Cardiology

## 2018-05-10 NOTE — Telephone Encounter (Signed)
     [facility] Pre-operative Risk Assessment    Request for surgical clearance:  1. What type of surgery is being performed? Cystoscopy with biopsy  2. When is this surgery scheduled? 06/09/18  3. What type of clearance is required (medical clearance vs. Pharmacy clearance to hold med vs. Both)? both  4. Are there any medications that need to be held prior to surgery and how long? Coumadin  5. Practice name and name of physician performing surgery?  [facility], Dr Jeffie Pollock  6. What is your office phone number [phone] ext [phone]   7.   What is your office fax number [phone]  8.   Anesthesia type (None, local, MAC, general) ? Choice   Gwendel Hanson 05/10/2018, 10:36 AM  _________________________________________________________________   (provider comments below)

## 2018-05-11 ENCOUNTER — Other Ambulatory Visit: Payer: Self-pay | Admitting: Family Medicine

## 2018-05-11 DIAGNOSIS — R2681 Unsteadiness on feet: Secondary | ICD-10-CM | POA: Diagnosis not present

## 2018-05-11 DIAGNOSIS — S88119A Complete traumatic amputation at level between knee and ankle, unspecified lower leg, initial encounter: Secondary | ICD-10-CM | POA: Diagnosis not present

## 2018-05-11 DIAGNOSIS — R531 Weakness: Secondary | ICD-10-CM | POA: Diagnosis not present

## 2018-05-12 NOTE — Telephone Encounter (Signed)
   Primary Cardiologist: Minus Breeding, MD  Chart reviewed as part of pre-operative protocol coverage. Given past medical history and time since last visit, based on ACC/AHA guidelines, Scott [name] would be at acceptable risk for the planned procedure without further cardiovascular testing.   His coumadin is being managed by his PCP, Ronnie Doss, DO. Clearance to hold and any additional recommendations regarding perioperative anticoagulation management will need to come from Dr. Lajuana Ripple.  I will fax medical clearance to requesting MD and will remove from preop pool.  Please call with questions.  Lyda Jester, PA-C 05/12/2018, 2:23 PM

## 2018-05-16 ENCOUNTER — Ambulatory Visit: Payer: Medicare Other | Admitting: Family Medicine

## 2018-05-16 ENCOUNTER — Encounter: Payer: Self-pay | Admitting: Family Medicine

## 2018-05-16 ENCOUNTER — Ambulatory Visit (INDEPENDENT_AMBULATORY_CARE_PROVIDER_SITE_OTHER): Payer: Medicare Other | Admitting: Family Medicine

## 2018-05-16 VITALS — BP 157/75 | HR 56 | Temp 97.8°F | Ht 67.0 in

## 2018-05-16 DIAGNOSIS — S78112D Complete traumatic amputation at level between left hip and knee, subsequent encounter: Secondary | ICD-10-CM

## 2018-05-16 DIAGNOSIS — S78112A Complete traumatic amputation at level between left hip and knee, initial encounter: Secondary | ICD-10-CM

## 2018-05-16 DIAGNOSIS — S88119A Complete traumatic amputation at level between knee and ankle, unspecified lower leg, initial encounter: Secondary | ICD-10-CM | POA: Diagnosis not present

## 2018-05-16 DIAGNOSIS — R2681 Unsteadiness on feet: Secondary | ICD-10-CM

## 2018-05-16 DIAGNOSIS — R531 Weakness: Secondary | ICD-10-CM | POA: Diagnosis not present

## 2018-05-16 NOTE — Progress Notes (Addendum)
Subjective: CC: Face to Face for prosthesis/ PT renewal PCP: Dettinger, Fransisca Kaufmann, MD XNT:ZGYFV L Brathwaite is a 85 y.o. male presenting to clinic today for:  1.  Left AKA Patient with history of left above knee amputation.  He has a prosthesis that is about a year old.  He has had problems with the medial aspect digging into the groin area and quite a bit of chafing.  He is undergone 19 sessions with physical therapist and is making progress.  He would like to become independent with use of prosthetic leg.  He denies any current blisters, skin breakdown or pain.  Currently, he is using a motorized wheelchair to get around.  I contacted his physical therapist, Mal[place], who notes that he is progressing very well in physical therapy.  He states that the prosthetic company has actually come out to remeasure the patient for new prosthesis.  He thinks there is an active prescription in place and that no other gels, socks or inserts are needed at this time.   ROS: Per HPI  Allergies  Allergen Reactions  . Lipitor [Atorvastatin] Other (See Comments)    unknown   Past Medical History:  Diagnosis Date  . Arthritis    "all over"  . Atrial fibrillation ([place])   . Benign localized prostatic hyperplasia with lower urinary tract symptoms (LUTS)   . Bradycardia 11/28/2015   Severe-HR in the 20s to 30s following intubation for urological procedure.  Marland Kitchen CAD- non obstructive disease by cath 3/14 cardiologist-  dr hochrein  . Chronic lower back pain   . COPD (chronic obstructive pulmonary disease) ([place])   . DDD (degenerative disc disease)   . Depression   . Dyspnea    w/ exertion and lying down (raise head)  . ETOH abuse   . GERD (gastroesophageal reflux disease)   . Glaucoma   . Glaucoma, both eyes   . History of bladder cancer urologist-- dr Jeffie Pollock   first dx 2012--  s/p TURBT's  . History of DVT of lower extremity 03/2013   bilateral   . History of pulmonary embolus (PE) 04/2013  . HTN  (hypertension)   . Hyperlipidemia   . Hypertension   . LBBB (left bundle branch block)    chronic  . Non-ischemic cardiomyopathy ([place])    last echo 01-18-2017, ef 35-40%  . NSVT (nonsustained ventricular tachycardia) ([place])    a. NSVT 06/2012; NSVT also seen during 03/2013 adm. b. Med rx. Not candidate for ICD given adv age.  Marland Kitchen PAD (peripheral artery disease) ([place])   . 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 PVCs (premature ventricular contractions)   . PVD (peripheral vascular disease) (HCC)    Right ABI .75, Left .78 (2006)  . Syncope 06/2012   a. Felt to be postural syncope related to diuretics 06/2012.  Marland Kitchen Systolic and diastolic CHF, chronic (HCC) cardiologsit-  dr hochrein   a. NICM - patent cors 06/2012, EF 40% at that time. b. 03/2013 eval: EF 20-25%.  . Vision loss, left eye    due to glaucoma  . Wears glasses     Current Outpatient Medications:  .  albuterol (PROVENTIL) (2.5 MG/3ML) 0.083% nebulizer solution, Take 3 mLs (2.5 mg total) by nebulization every 6 (six) hours as needed for wheezing or shortness of breath., Disp: 150 mL, Rfl: 1 .  COMBIVENT RESPIMAT 20-100 MCG/ACT AERS respimat, 1 PUFF EVERY 4 HOURS  AS NEEDED FOR COUGH, WHEEZING, OR SHORTNESS OF BREATH, Disp: 4 g, Rfl: 4 .  donepezil (ARICEPT) 10 MG tablet, TAKE ONE TABLET AT BEDTIME, Disp: 30 tablet, Rfl: 4 .  febuxostat (ULORIC) 40 MG tablet, TAKE 1 TABLET DAILY, Disp: 30 tablet, Rfl: 5 .  FEROSUL 325 (65 Fe) MG tablet, Take 1 tablet (325 mg total) by mouth daily with breakfast., Disp: 30 tablet, Rfl: 4 .  fluticasone (FLONASE) 50 MCG/ACT nasal spray, Place 1 spray into both nostrils daily as needed for allergies or rhinitis., Disp: 16 g, Rfl: 2 .  folic acid (FOLVITE) 1 MG tablet, TAKE 1 TABLET DAILY, Disp: 30 tablet, Rfl: 5 .  furosemide (LASIX) 40 MG tablet, Take 1 tablet (40 mg total) by mouth 2 (two) times daily., Disp:  60 tablet, Rfl: 4 .  gabapentin (NEURONTIN) 100 MG capsule, Take 2 capsules (200 mg total) by mouth 2 (two) times daily., Disp: 60 capsule, Rfl: 0 .  guaiFENesin (MUCINEX) 600 MG 12 hr tablet, Take 600 mg by mouth daily as needed (congestion)., Disp: , Rfl:  .  isosorbide dinitrate (ISORDIL) 30 MG tablet, TAKE 1 TABLET ONCE A DAY, Disp: 30 tablet, Rfl: 11 .  latanoprost (XALATAN) 0.005 % ophthalmic solution, Place 1 drop into both eyes at bedtime., Disp: , Rfl:  .  metoprolol succinate (TOPROL-XL) 50 MG 24 hr tablet, TAKE 1 TABLET DAILY, Disp: 30 tablet, Rfl: 0 .  MYRBETRIQ 25 MG TB24 tablet, Take 25 mg by mouth daily. , Disp: , Rfl:  .  nitroGLYCERIN (NITROSTAT) 0.4 MG SL tablet, Place 1 tablet (0.4 mg total) under the tongue every 5 (five) minutes as needed for chest pain (up to 3 doses)., Disp: 25 tablet, Rfl: 3 .  pantoprazole (PROTONIX) 40 MG tablet, TAKE 1 TABLET DAILY, Disp: 90 tablet, Rfl: 0 .  PROAIR HFA 108 (90 Base) MCG/ACT inhaler, 2 PUFFS EVERY 6 HOURS AS NEEDED FOR WHEEZING OR SHORTNESS OF BREATH, Disp: 8.5 g, Rfl: 4 .  sertraline (ZOLOFT) 50 MG tablet, TAKE 1 TABLET IN THE MORNING, Disp: 30 tablet, Rfl: 0 .  simvastatin (ZOCOR) 10 MG tablet, TAKE 1 TABLET ONCE DAILY IN THE EVENING, Disp: 90 tablet, Rfl: 0 .  Spacer/Aero Chamber Mouthpiece MISC, Use with Albuterol HFA inhaler as directed, Disp: 1 each, Rfl: 1 .  warfarin (COUMADIN) 2.5 MG tablet, Take 5 mg Friday March 29, then take 2.5 mg daily except 5 mg each Monday. (Patient taking differently: Take 2.5-5 mg by mouth See admin instructions. Take 2.5 mg by mouth once daily except on Mondays and Fridays take 5 mg once daily), Disp: 10 tablet, Rfl: 0 Social History   Socioeconomic History  . Marital status: Married    Spouse name: Not on file  . Number of children: 1  . Years of education: Not on file  . Highest education level: Not on file  Occupational History  . Occupation: Retired  Scientific laboratory technician  . Financial resource  strain: Not on file  . Food insecurity:    Worry: Not on file    Inability: Not on file  . Transportation needs:    Medical: Not on file    Non-medical: Not on file  Tobacco Use  . Smoking status: Former Smoker    Packs/day: 1.00    Years: 35.00    Pack years: 35.00    Types: Cigarettes    Start date: 04/12/1942    Last attempt to quit: 04/13/1975    Years since quitting: 43.1  .  Smokeless tobacco: Never Used  Substance and Sexual Activity  . Alcohol use: Yes    Alcohol/week: 7.0 standard drinks    Types: 7 Cans of beer per week  . Drug use: No  . Sexual activity: Never  Lifestyle  . Physical activity:    Days per week: Not on file    Minutes per session: Not on file  . Stress: Not on file  Relationships  . Social connections:    Talks on phone: Not on file    Gets together: Not on file    Attends religious service: Not on file    Active member of club or organization: Not on file    Attends meetings of clubs or organizations: Not on file    Relationship status: Not on file  . Intimate partner violence:    Fear of current or ex partner: Not on file    Emotionally abused: Not on file    Physically abused: Not on file    Forced sexual activity: Not on file  Other Topics Concern  . Not on file  Social History Narrative   Lives in [place] with spouse who is s/p spinal cord injury.  He has one grown son who lives in [place].  He is retired but worked for [employer] for years and lives in [place].   Family History  Problem Relation Age of Onset  . Hypertension Mother   . Arthritis Mother   . Lung cancer Sister   . Deep vein thrombosis Father   . Early death Brother     Objective: Office vital signs reviewed. BP (!) 157/75   Pulse (!) 56   Temp 97.8 F (36.6 C) ([place])   Ht 5' 7"  (1.702 m)   BMI 25.53 kg/m   Physical Examination:  General: Awake, alert, well nourished, No acute distress Extremities: warm.  Left above-the-knee amputation noted.  Stump  without skin breakdown, erythema, induration or swelling.  No tenderness to palpation to the stump.  He has a well-healed postsurgical scar. MSK: Uses motorized wheelchair for ambulation.  Assessment/ Plan: 85 y.o. male   1. Unilateral AKA, left (HCC) I was able to reach his physical therapist at his physical therapy site and had a good conversation with him today with regards to patient's progress.  He seems to be progressing well with physical therapy.  I certainly think that he would be capable of use of a prosthetic leg if it were better fitting.  TO BE TOTALLY CLEAR, SOCKET IS ILL FITTING CAUSING EXCESSIVE CHAFING AND DISCOMFORT, CURRENTLY PROHIBITING USE OF PROSTHESIS. HE NEEDS A NEW ONE.  I have signed his physical therapy report/form.  A copy has been placed for scanning.  I have also given him a written prescription for lower prosthetic leg if needed.  I have encouraged him to have his facility contact us if there are any further needs to be met.  He will continue following up with PCP for ongoing blood pressure management.  Blood pressure was slightly elevated today.  2. Gait instability Continue working with physical therapy.  Follow-up with PCP PRN.   Janora Norlander, DO [place] [phone]

## 2018-05-16 NOTE — Patient Instructions (Signed)
I have renewed the physical therapy release and return this to you.  I spoke to [place], your physical therapist who thinks that you have an active prescription for a new prosthesis.  But I am sending you home with a prescription just in case.  Follow-up with Dr. Warrick Parisian as needed for ongoing needs.

## 2018-05-18 DIAGNOSIS — R531 Weakness: Secondary | ICD-10-CM | POA: Diagnosis not present

## 2018-05-18 DIAGNOSIS — S88119A Complete traumatic amputation at level between knee and ankle, unspecified lower leg, initial encounter: Secondary | ICD-10-CM | POA: Diagnosis not present

## 2018-05-18 DIAGNOSIS — R2681 Unsteadiness on feet: Secondary | ICD-10-CM | POA: Diagnosis not present

## 2018-05-19 ENCOUNTER — Telehealth: Payer: Self-pay | Admitting: *Deleted

## 2018-05-19 NOTE — Telephone Encounter (Signed)
Incoming call from [facility] with [facility] Pt has biopsy scheduled for 06/09/2018 Please advise on holding Coumadin

## 2018-05-19 NOTE — Telephone Encounter (Signed)
Have them hold his Coumadin 5 days before and started the day after the procedure

## 2018-05-19 NOTE — Telephone Encounter (Signed)
Pam notified of recommendation verbalizes understanding

## 2018-05-23 DIAGNOSIS — R531 Weakness: Secondary | ICD-10-CM | POA: Diagnosis not present

## 2018-05-23 DIAGNOSIS — R2681 Unsteadiness on feet: Secondary | ICD-10-CM | POA: Diagnosis not present

## 2018-05-23 DIAGNOSIS — S88119A Complete traumatic amputation at level between knee and ankle, unspecified lower leg, initial encounter: Secondary | ICD-10-CM | POA: Diagnosis not present

## 2018-05-24 ENCOUNTER — Other Ambulatory Visit: Payer: Self-pay | Admitting: Family Medicine

## 2018-05-25 DIAGNOSIS — R531 Weakness: Secondary | ICD-10-CM | POA: Diagnosis not present

## 2018-05-25 DIAGNOSIS — S88119A Complete traumatic amputation at level between knee and ankle, unspecified lower leg, initial encounter: Secondary | ICD-10-CM | POA: Diagnosis not present

## 2018-05-25 DIAGNOSIS — R2681 Unsteadiness on feet: Secondary | ICD-10-CM | POA: Diagnosis not present

## 2018-05-30 DIAGNOSIS — R531 Weakness: Secondary | ICD-10-CM | POA: Diagnosis not present

## 2018-05-30 DIAGNOSIS — R2681 Unsteadiness on feet: Secondary | ICD-10-CM | POA: Diagnosis not present

## 2018-05-30 DIAGNOSIS — S88119A Complete traumatic amputation at level between knee and ankle, unspecified lower leg, initial encounter: Secondary | ICD-10-CM | POA: Diagnosis not present

## 2018-05-31 NOTE — Patient Instructions (Addendum)
Scott [name]  05/31/2018     @PREFPERIOPPHARMACY @   Your procedure is scheduled on  06/09/2018  Report to [facility] at  1045  A.M.  Call this number if you have problems the morning of surgery:  [phone]   Remember:  Do not eat or drink after midnight.                      Stop Coumadin as instructed by Dr Jeffie Pollock on 06/04/2018  Take these medicines the morning of surgery with A SIP OF WATER uloric, isosorbide, metoprolol, protonix, zoloft. Use your nebulizer and your inhaler before you come.    Do not wear jewelry, make-up or nail polish.  Do not wear lotions, powders, or perfumes, or deodorant.  Do not shave 48 hours prior to surgery.  Men may shave face and neck.  Do not bring valuables to the hospital.  [facility] is not responsible for any belongings or valuables.  Contacts, dentures or bridgework may not be worn into surgery.  Leave your suitcase in the car.  After surgery it may be brought to your room.  For patients admitted to the hospital, discharge time will be determined by your treatment team.  Patients discharged the day of surgery will not be allowed to drive home.   Name and phone number of your driver:   family Special instructions:  Follow any instructions given to you by Dr Alyson Ingles about your coumadin.  Please read over the following fact sheets that you were given. Anesthesia Post-op Instructions and Care and Recovery After Surgery       Cystoscopy  Cystoscopy is a procedure that is used to help diagnose and sometimes treat conditions that affect that lower urinary tract. The lower urinary tract includes the bladder and the tube that drains urine from the bladder out of the body (urethra). Cystoscopy is performed with a thin, tube-shaped instrument with a light and camera at the end (cystoscope). The cystoscope may be hard (rigid) or flexible, depending on the goal of the procedure.The cystoscope is inserted through the urethra, into the  bladder. Cystoscopy may be recommended if you have:  Urinary tractinfections that keep coming back (recurring).  Blood in the urine (hematuria).  Loss of bladder control (urinary incontinence) or an overactive bladder.  Unusual cells found in a urine sample.  A blockage in the urethra.  Painful urination.  An abnormality in the bladder found during an intravenous pyelogram (IVP) or CT scan. Cystoscopy may also be done to remove a sample of tissue to be examined under a microscope (biopsy). Tell a health care provider about:  Any allergies you have.  All medicines you are taking, including vitamins, herbs, eye drops, creams, and over-the-counter medicines.  Any problems you or family members have had with anesthetic medicines.  Any blood disorders you have.  Any surgeries you have had.  Any medical conditions you have.  Whether you are pregnant or may be pregnant. What are the risks? Generally, this is a safe procedure. However, problems may occur, including:  Infection.  Bleeding.  Allergic reactions to medicines.  Damage to other structures or organs. What happens before the procedure?  Ask your health care provider about: ? Changing or stopping your regular medicines. This is especially important if you are taking diabetes medicines or blood thinners. ? Taking medicines such as aspirin and ibuprofen. These medicines can thin your blood. Do not take these  medicines before your procedure if your health care provider instructs you not to.  Follow instructions from your health care provider about eating or drinking restrictions.  You may be given antibiotic medicine to help prevent infection.  You may have an exam or testing, such as X-rays of the bladder, urethra, or kidneys.  You may have urine tests to check for signs of infection.  Plan to have someone take you home after the procedure. What happens during the procedure?  To reduce your risk of  infection,your health care team will wash or sanitize their hands.  You will be given one or more of the following: ? A medicine to help you relax (sedative). ? A medicine to numb the area (local anesthetic).  The area around the opening of your urethra will be cleaned.  The cystoscope will be passed through your urethra into your bladder.  Germ-free (sterile)fluid will flow through the cystoscope to fill your bladder. The fluid will stretch your bladder so that your surgeon can clearly examine your bladder walls.  The cystoscope will be removed and your bladder will be emptied. The procedure may vary among health care providers and hospitals. What happens after the procedure?  You may have some soreness or pain in your abdomen and urethra. Medicines will be available to help you.  You may have some blood in your urine.  Do not drive for 24 hours if you received a sedative. This information is not intended to replace advice given to you by your health care provider. Make sure you discuss any questions you have with your health care provider. Document Released: 03/26/2000 Document Revised: 01/07/2017 Document Reviewed: 02/13/2015 Elsevier Interactive Patient Education  2019 [place].  Bladder Biopsy, Care After Refer to this sheet in the next few weeks. These instructions provide you with information about caring for yourself after your procedure. Your health care provider may also give you more specific instructions. Your treatment has been planned according to current medical practices, but problems sometimes occur. Call your health care provider if you have any problems or questions after your procedure. What can I expect after the procedure? After the procedure, it is common to have:  Mild pain in your bladder or kidney area during urination.  Minor burning during urination.  Small amounts of blood in your urine.  A sudden urge to urinate.  A need to urinate more often  than usual. Follow these instructions at home: Medicines  Take over-the-counter and prescription medicines only as told by your health care provider.  If you were prescribed an antibiotic medicine, take it as told by your health care provider. Do not stop taking the antibiotic even if you start to feel better. General instructions   Take a warm bath to relieve any burning sensations around your urethra.  Hold a warm, damp washcloth over the urethral area to ease pain.  Return to your normal activities as told by your health care provider. Ask your health care provider what activities are safe for you.  Do not drive for 24 hours if you received a medicine to help you relax (sedative) during your procedure. Ask your health care provider when it is safe for you to drive.  It is your responsibility to get the results of your procedure. Ask your health care provider or the department performing the procedure when your results will be ready.  Keep all follow-up visits as told by your health care provider. This is important. Contact a health care provider  if:  You have a fever.  Your symptoms do not improve within 24 hours and you continue to have: ? Burning during urination. ? Increasing amounts of blood in your urine. ? Pain during urination. ? An urgent need to urinate. ? A need to urinate more often than usual. Get help right away if:  You have a lot of bleeding or more bleeding.  You have severe pain.  You are unable to urinate.  You have bright red blood in your urine.  You are passing blood clots in your urine.  You have a fever.  You have swelling, redness, or pain in your legs.  You have difficulty breathing. This information is not intended to replace advice given to you by your health care provider. Make sure you discuss any questions you have with your health care provider. Document Released: 04/15/2015 Document Revised: 09/04/2015 Document Reviewed:  04/15/2015 Elsevier Interactive Patient Education  2019 [place] Anesthesia, Adult, Care After This sheet gives you information about how to care for yourself after your procedure. Your health care provider may also give you more specific instructions. If you have problems or questions, contact your health care provider. What can I expect after the procedure? After the procedure, the following side effects are common:  Pain or discomfort at the IV site.  Nausea.  Vomiting.  Sore throat.  Trouble concentrating.  Feeling cold or chills.  Weak or tired.  Sleepiness and fatigue.  Soreness and body aches. These side effects can affect parts of the body that were not involved in surgery. Follow these instructions at home:  For at least 24 hours after the procedure:  Have a responsible adult stay with you. It is important to have someone help care for you until you are awake and alert.  Rest as needed.  Do not: ? Participate in activities in which you could fall or become injured. ? Drive. ? Use heavy machinery. ? Drink alcohol. ? Take sleeping pills or medicines that cause drowsiness. ? Make important decisions or sign legal documents. ? Take care of children on your own. Eating and drinking  Follow any instructions from your health care provider about eating or drinking restrictions.  When you feel hungry, start by eating small amounts of foods that are soft and easy to digest (bland), such as toast. Gradually return to your regular diet.  Drink enough fluid to keep your urine pale yellow.  If you vomit, rehydrate by drinking water, juice, or clear broth. General instructions  If you have sleep apnea, surgery and certain medicines can increase your risk for breathing problems. Follow instructions from your health care provider about wearing your sleep device: ? Anytime you are sleeping, including during daytime naps. ? While taking prescription pain  medicines, sleeping medicines, or medicines that make you drowsy.  Return to your normal activities as told by your health care provider. Ask your health care provider what activities are safe for you.  Take over-the-counter and prescription medicines only as told by your health care provider.  If you smoke, do not smoke without supervision.  Keep all follow-up visits as told by your health care provider. This is important. Contact a health care provider if:  You have nausea or vomiting that does not get better with medicine.  You cannot eat or drink without vomiting.  You have pain that does not get better with medicine.  You are unable to pass urine.  You develop a skin rash.  You have  a fever.  You have redness around your IV site that gets worse. Get help right away if:  You have difficulty breathing.  You have chest pain.  You have blood in your urine or stool, or you vomit blood. Summary  After the procedure, it is common to have a sore throat or nausea. It is also common to feel tired.  Have a responsible adult stay with you for the first 24 hours after general anesthesia. It is important to have someone help care for you until you are awake and alert.  When you feel hungry, start by eating small amounts of foods that are soft and easy to digest (bland), such as toast. Gradually return to your regular diet.  Drink enough fluid to keep your urine pale yellow.  Return to your normal activities as told by your health care provider. Ask your health care provider what activities are safe for you. This information is not intended to replace advice given to you by your health care provider. Make sure you discuss any questions you have with your health care provider. Document Released: 07/05/2000 Document Revised: 11/12/2016 Document Reviewed: 11/12/2016 Elsevier Interactive Patient Education  2019 [employer].

## 2018-06-01 DIAGNOSIS — R531 Weakness: Secondary | ICD-10-CM | POA: Diagnosis not present

## 2018-06-01 DIAGNOSIS — S88119A Complete traumatic amputation at level between knee and ankle, unspecified lower leg, initial encounter: Secondary | ICD-10-CM | POA: Diagnosis not present

## 2018-06-01 DIAGNOSIS — R2681 Unsteadiness on feet: Secondary | ICD-10-CM | POA: Diagnosis not present

## 2018-06-03 ENCOUNTER — Other Ambulatory Visit: Payer: Self-pay | Admitting: Family Medicine

## 2018-06-05 ENCOUNTER — Encounter (HOSPITAL_COMMUNITY)
Admission: RE | Admit: 2018-06-05 | Discharge: 2018-06-05 | Disposition: A | Discharge status: Home / Self Care | Payer: Medicare Other | Source: Ambulatory Visit | Attending: Urology | Admitting: Urology

## 2018-06-05 ENCOUNTER — Encounter (HOSPITAL_COMMUNITY): Payer: Self-pay

## 2018-06-05 DIAGNOSIS — Z01818 Encounter for other preprocedural examination: Secondary | ICD-10-CM | POA: Insufficient documentation

## 2018-06-05 LAB — BASIC METABOLIC PANEL
Anion gap: 8 (ref 5–15)
BUN: 27 mg/dL — ABNORMAL HIGH (ref 8–23)
CO2: 25 mmol/L (ref 22–32)
Calcium: 9.4 mg/dL (ref 8.9–10.3)
Chloride: 108 mmol/L (ref 98–111)
Creatinine, Ser: 1.35 mg/dL — ABNORMAL HIGH (ref 0.61–1.24)
GFR calc Af Amer: 52 mL/min — ABNORMAL LOW (ref >60)
GFR calc non Af Amer: 45 mL/min — ABNORMAL LOW (ref >60)
Glucose, Bld: 143 mg/dL — ABNORMAL HIGH (ref 70–99)
Potassium: 3.4 mmol/L — ABNORMAL LOW (ref 3.5–5.1)
Sodium: 141 mmol/L (ref 135–145)

## 2018-06-07 ENCOUNTER — Encounter: Payer: Self-pay | Admitting: Pharmacist Clinician (PhC)/ Clinical Pharmacy Specialist

## 2018-06-08 ENCOUNTER — Other Ambulatory Visit: Payer: Self-pay | Admitting: Family Medicine

## 2018-06-09 ENCOUNTER — Encounter (HOSPITAL_COMMUNITY)
Admission: RE | Disposition: A | Discharge status: Home / Self Care | Payer: Self-pay | Source: Home / Self Care | Attending: Urology

## 2018-06-09 ENCOUNTER — Ambulatory Visit (HOSPITAL_COMMUNITY): Payer: Medicare Other | Admitting: Anesthesiology

## 2018-06-09 ENCOUNTER — Ambulatory Visit (HOSPITAL_COMMUNITY)
Admission: RE | Admit: 2018-06-09 | Discharge: 2018-06-09 | Disposition: A | Discharge status: Home / Self Care | Payer: Medicare Other | Attending: Urology | Admitting: Urology

## 2018-06-09 ENCOUNTER — Encounter (HOSPITAL_COMMUNITY): Payer: Self-pay

## 2018-06-09 DIAGNOSIS — I110 Hypertensive heart disease with heart failure: Secondary | ICD-10-CM | POA: Diagnosis not present

## 2018-06-09 DIAGNOSIS — N329 Bladder disorder, unspecified: Secondary | ICD-10-CM | POA: Diagnosis not present

## 2018-06-09 DIAGNOSIS — C675 Malignant neoplasm of bladder neck: Secondary | ICD-10-CM | POA: Diagnosis not present

## 2018-06-09 DIAGNOSIS — J449 Chronic obstructive pulmonary disease, unspecified: Secondary | ICD-10-CM | POA: Diagnosis not present

## 2018-06-09 DIAGNOSIS — Z79899 Other long term (current) drug therapy: Secondary | ICD-10-CM | POA: Diagnosis not present

## 2018-06-09 DIAGNOSIS — Z7951 Long term (current) use of inhaled steroids: Secondary | ICD-10-CM | POA: Diagnosis not present

## 2018-06-09 DIAGNOSIS — D494 Neoplasm of unspecified behavior of bladder: Secondary | ICD-10-CM | POA: Insufficient documentation

## 2018-06-09 DIAGNOSIS — Z87440 Personal history of urinary (tract) infections: Secondary | ICD-10-CM | POA: Diagnosis not present

## 2018-06-09 DIAGNOSIS — I5022 Chronic systolic (congestive) heart failure: Secondary | ICD-10-CM | POA: Insufficient documentation

## 2018-06-09 DIAGNOSIS — Z86718 Personal history of other venous thrombosis and embolism: Secondary | ICD-10-CM | POA: Diagnosis not present

## 2018-06-09 DIAGNOSIS — Z7901 Long term (current) use of anticoagulants: Secondary | ICD-10-CM | POA: Insufficient documentation

## 2018-06-09 DIAGNOSIS — I252 Old myocardial infarction: Secondary | ICD-10-CM | POA: Diagnosis not present

## 2018-06-09 DIAGNOSIS — N3281 Overactive bladder: Secondary | ICD-10-CM | POA: Diagnosis not present

## 2018-06-09 DIAGNOSIS — Z89619 Acquired absence of unspecified leg above knee: Secondary | ICD-10-CM | POA: Diagnosis not present

## 2018-06-09 DIAGNOSIS — I10 Essential (primary) hypertension: Secondary | ICD-10-CM | POA: Diagnosis not present

## 2018-06-09 DIAGNOSIS — C679 Malignant neoplasm of bladder, unspecified: Secondary | ICD-10-CM | POA: Diagnosis not present

## 2018-06-09 DIAGNOSIS — M109 Gout, unspecified: Secondary | ICD-10-CM | POA: Diagnosis not present

## 2018-06-09 DIAGNOSIS — I2510 Atherosclerotic heart disease of native coronary artery without angina pectoris: Secondary | ICD-10-CM | POA: Diagnosis not present

## 2018-06-09 DIAGNOSIS — N3289 Other specified disorders of bladder: Secondary | ICD-10-CM | POA: Diagnosis not present

## 2018-06-09 DIAGNOSIS — E785 Hyperlipidemia, unspecified: Secondary | ICD-10-CM | POA: Insufficient documentation

## 2018-06-09 HISTORY — PX: CYSTOSCOPY WITH BIOPSY: SHX5122

## 2018-06-09 LAB — PROTIME-INR
INR: 1.1 (ref 0.8–1.2)
Prothrombin Time: 14.4 seconds (ref 11.4–15.2)

## 2018-06-09 SURGERY — CYSTOSCOPY, WITH BIOPSY
Anesthesia: General

## 2018-06-09 MED ORDER — OXYCODONE HCL 5 MG PO TABS: 5.0000 mg | ORAL_TABLET | ORAL | Status: DC | PRN

## 2018-06-09 MED ORDER — SODIUM CHLORIDE 0.9% FLUSH: 3.0000 mL | Freq: Two times a day (BID) | INTRAVENOUS | Status: DC

## 2018-06-09 MED ORDER — ONDANSETRON HCL 4 MG/2ML IJ SOLN: 4.0000 mg | Freq: Once | INTRAMUSCULAR | Status: DC | PRN

## 2018-06-09 MED ORDER — CEFAZOLIN SODIUM-DEXTROSE 2-4 GM/100ML-% IV SOLN
2.0000 g | INTRAVENOUS | Status: AC
  Administered 2018-06-09: 2 g via INTRAVENOUS
  Filled 2018-06-09: qty 100

## 2018-06-09 MED ORDER — IPRATROPIUM-ALBUTEROL 0.5-2.5 (3) MG/3ML IN SOLN
RESPIRATORY_TRACT | Status: AC
  Filled 2018-06-09: qty 3

## 2018-06-09 MED ORDER — STERILE WATER FOR IRRIGATION IR SOLN
Status: DC | PRN
  Administered 2018-06-09: 500 mL
  Administered 2018-06-09: 3000 mL

## 2018-06-09 MED ORDER — GLYCOPYRROLATE 0.2 MG/ML IJ SOLN
INTRAMUSCULAR | Status: DC | PRN
  Administered 2018-06-09: 0.2 mg via INTRAVENOUS

## 2018-06-09 MED ORDER — LACTATED RINGERS IV SOLN
INTRAVENOUS | Status: DC
  Administered 2018-06-09: 11:00:00 via INTRAVENOUS

## 2018-06-09 MED ORDER — ACETAMINOPHEN 325 MG PO TABS: 650.0000 mg | ORAL_TABLET | ORAL | Status: DC | PRN

## 2018-06-09 MED ORDER — PROPOFOL 10 MG/ML IV BOLUS
INTRAVENOUS | Status: DC | PRN
  Administered 2018-06-09: 150 mg via INTRAVENOUS

## 2018-06-09 MED ORDER — PHENYLEPHRINE HCL 10 MG/ML IJ SOLN
INTRAMUSCULAR | Status: DC | PRN
  Administered 2018-06-09 (×3): 80 ug via INTRAVENOUS

## 2018-06-09 MED ORDER — SODIUM CHLORIDE 0.9 % IV SOLN: 250.0000 mL | INTRAVENOUS | Status: DC | PRN

## 2018-06-09 MED ORDER — HYDROCODONE-ACETAMINOPHEN 7.5-325 MG PO TABS: 1.0000 | ORAL_TABLET | Freq: Once | ORAL | Status: DC | PRN

## 2018-06-09 MED ORDER — ARTIFICIAL TEARS OPHTHALMIC OINT
TOPICAL_OINTMENT | OPHTHALMIC | Status: AC
  Filled 2018-06-09: qty 3.5

## 2018-06-09 MED ORDER — MEPERIDINE HCL 50 MG/ML IJ SOLN: 6.2500 mg | INTRAMUSCULAR | Status: DC | PRN

## 2018-06-09 MED ORDER — ACETAMINOPHEN 650 MG RE SUPP
650.0000 mg | RECTAL | Status: DC | PRN
  Filled 2018-06-09: qty 1

## 2018-06-09 MED ORDER — LIDOCAINE HCL (CARDIAC) PF 100 MG/5ML IV SOSY
PREFILLED_SYRINGE | INTRAVENOUS | Status: DC | PRN
  Administered 2018-06-09: 40 mg via INTRAVENOUS

## 2018-06-09 MED ORDER — HYDROMORPHONE HCL 1 MG/ML IJ SOLN: 0.2500 mg | INTRAMUSCULAR | Status: DC | PRN

## 2018-06-09 MED ORDER — MORPHINE SULFATE (PF) 2 MG/ML IV SOLN: 1.0000 mg | INTRAVENOUS | Status: DC | PRN

## 2018-06-09 MED ORDER — SODIUM CHLORIDE 0.9% FLUSH: 3.0000 mL | INTRAVENOUS | Status: DC | PRN

## 2018-06-09 MED ORDER — KETAMINE HCL 50 MG/5ML IJ SOSY
PREFILLED_SYRINGE | INTRAMUSCULAR | Status: AC
  Filled 2018-06-09: qty 5

## 2018-06-09 MED ORDER — IPRATROPIUM-ALBUTEROL 0.5-2.5 (3) MG/3ML IN SOLN
3.0000 mL | Freq: Once | RESPIRATORY_TRACT | Status: AC
  Administered 2018-06-09: 3 mL via RESPIRATORY_TRACT

## 2018-06-09 SURGICAL SUPPLY — 16 items
BAG DRAIN URO TABLE W/ADPT NS (BAG) ×2 IMPLANT
BAG DRN 8 ADPR NS SKTRN CSTL (BAG) ×1
CLOTH BEACON ORANGE TIMEOUT ST (SAFETY) ×2 IMPLANT
GLOVE BIO SURGEON STRL SZ7 (GLOVE) ×2 IMPLANT
GLOVE BIOGEL PI IND STRL 7.0 (GLOVE) ×2 IMPLANT
GLOVE BIOGEL PI INDICATOR 7.0 (GLOVE) ×2
GLOVE SURG SS PI 8.0 STRL IVOR (GLOVE) ×2 IMPLANT
GOWN STRL REUS W/TWL LRG LVL3 (GOWN DISPOSABLE) ×2 IMPLANT
KIT TURNOVER CYSTO (KITS) ×2 IMPLANT
MANIFOLD NEPTUNE II (INSTRUMENTS) ×2 IMPLANT
PACK CYSTO (CUSTOM PROCEDURE TRAY) ×2 IMPLANT
PAD ARMBOARD 7.5X6 YLW CONV (MISCELLANEOUS) ×2 IMPLANT
PAD TELFA 3X4 1S STER (GAUZE/BANDAGES/DRESSINGS) ×2 IMPLANT
TOWEL OR 17X26 4PK STRL BLUE (TOWEL DISPOSABLE) ×4 IMPLANT
WATER STERILE IRR 1000ML POUR (IV SOLUTION) ×2 IMPLANT
WATER STERILE IRR 3000ML UROMA (IV SOLUTION) ×2 IMPLANT

## 2018-06-09 NOTE — Anesthesia Procedure Notes (Signed)
Procedure Name: LMA Insertion Date/Time: 06/09/2018 12:12 PM Performed by: Adams, Amy A, CRNA Pre-anesthesia Checklist: Patient identified, Emergency Drugs available, Suction available, Patient being monitored and Timeout performed Patient Re-evaluated:Patient Re-evaluated prior to induction Oxygen Delivery Method: Circle system utilized Preoxygenation: Pre-oxygenation with 100% oxygen Induction Type: IV induction LMA: LMA inserted LMA Size: 4.0 Placement Confirmation: positive ETCO2 and breath sounds checked- equal and bilateral Tube secured with: Tape

## 2018-06-09 NOTE — Progress Notes (Signed)
Duo neb tx done as ordered. Tolerated well.

## 2018-06-09 NOTE — H&P (Signed)
CC: I have bladder cancer.  HPI: Scott [name] is a 85 year-old male established patient who is here for bladder cancer.    Scott [name] returns today in f/u for his history of recurrent UCa. 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 There was dysplasia on the posterior wall. He is dong well without complaints. He has a good stream. He is on myrbetriq for OAB. UA is clear today.      ALLERGIES: No Allergies    MEDICATIONS: Coumadin 2 mg tablet  Coumadin 2.5 mg tablet  Metoprolol Succinate 50 mg tablet, extended release 24 hr  Myrbetriq 25 mg tablet, extended release 24 hr 1 tablet PO Daily  Simvastatin 10 mg tablet  Albuterol Sulfate 2.5 mg/3 ml (0.083 %) vial, nebulizer  Donepezil Hcl 10 mg tablet  Ferrous Sulfate 325 mg (65 mg iron) tablet  Fluticasone Propionate 50 mcg/actuation spray, suspension  Folic Acid 1 mg tablet  Furosemide 40 mg tablet  Gabapentin 100 mg capsule  Isosorbide Dinitrate 30 mg tablet  Nitrostat 0.4 mg tablet, sublingual  Pantoprazole Sodium 40 mg tablet, delayed release  Sertraline Hcl 50 mg tablet  Uloric 40 mg tablet  Zocor     GU PSH: Bladder Instill AntiCA Agent - 2017 Cysto Bladder Ureth Biopsy - 2013, 2013 Cystoscopy - 12/02/2017, 11/19/2016, 2018, 11/14/2015 Cystoscopy Fulguration - 12/09/2016, 06/01/2016, 01/20/2016 Cystoscopy TURBT <2 cm - 12/20/2017, 06/01/2016 Cystoscopy TURBT 2-5 cm - 12/09/2016, 2017, 2013 Cystoscopy TURP - 2013      PSH Notes: Bladder Injection Of Cancer Treatment, Cystoscopy With Fulguration Medium Lesion (2-5cm), Amputation Of Leg Above Knee, Transurethral Resection Of Prostate (TURP), Cystoscopy With Biopsy, Cystoscopy With Biopsy, Cystoscopy With Fulguration Medium Lesion (2-5cm), No Surgical Problems   NON-GU PSH: Amputate Leg At Thigh - 2017    GU PMH: Bladder Cancer Lateral, LG NMIBC. He will return in 3 months for cystoscopy. -  01/20/2018 Bladder Cancer Posterior, dysplasia. - 01/20/2018, He has lesions on the posterior wall that are suspicious for CIS. I will send a cytology but will get him set up for a cystoscopy with biopsy and fulguration under MAC. He will need to hold his warfarin. , - 12/02/2017 Personal Hx Urinary Tract Infections, He completed treatment for a UTI 2 days ago. I will send a UA an culture to confirm that it has cleared. - 04/08/2017 Urge incontinence (Worsening) - 01/07/2017 Bladder Cancer, Unspec, He had a recurrence in his prostatic urethra at the bladder. - 06/11/2016 Urethral Cancer, He has a small papillary urethral cancer. - 11/14/2015 Bladder Cancer Trigone , Malignant neoplasm of trigone of bladder - 2017 Urinary Urgency, Urinary urgency - 2017 Gross hematuria, Gross hematuria - 2017 History of bladder cancer, History of bladder cancer - 2015 BPH w/LUTS, Benign prostatic hyperplasia with urinary obstruction - 2014      PMH Notes:  2012-08-11 14:20:22 - Note: Heart Block  2011-09-20 12:53:15 - Note: Arthritis  He was treated for pneumonia in mid July 2017.    NON-GU PMH: Personal history of other diseases of the circulatory system, History of congestive heart disease - 2017, History of hypertension, - 2014 Acute embolism and thrombosis of unspecified deep veins of unspecified distal lower extremity, Deep vein thrombosis of distal lower extremity - 2951 Chronic systolic (congestive) heart failure, Chronic systolic congestive heart failure - 2014 DVT, History, H/O blood clots - 2014 Gout, Gout - 2014 Personal history of other diseases of  the nervous system and sense organs, History of glaucoma - 2014 Personal history of other endocrine, nutritional and metabolic disease, History of hypercholesterolemia - 2014 Encounter for general adult medical examination without abnormal findings, Encounter for preventive health examination    FAMILY HISTORY: Death In The Family Father - Runs In  Family Death In The Family Mother - Runs In [place] Status Number - Runs In Family   SOCIAL HISTORY: None    Notes: Former smoker, Marital History - Currently Married, Caffeine Use, Alcohol Use   REVIEW OF SYSTEMS:    GU Review Male:   Patient denies frequent urination, hard to postpone urination, burning/ pain with urination, get up at night to urinate, leakage of urine, stream starts and stops, trouble starting your stream, have to strain to urinate , erection problems, and penile pain.  Gastrointestinal (Upper):   Patient denies nausea, vomiting, and indigestion/ heartburn.  Gastrointestinal (Lower):   Patient denies diarrhea and constipation.  Constitutional:   Patient denies fever, night sweats, weight loss, and fatigue.  Skin:   Patient denies skin rash/ lesion and itching.  Eyes:   Patient denies blurred vision and double vision.  Ears/ Nose/ Throat:   Patient denies sore throat and sinus problems.  Hematologic/Lymphatic:   Patient denies swollen glands and easy bruising.  Cardiovascular:   Patient denies leg swelling and chest pains.  Respiratory:   Patient denies cough and shortness of breath.  Endocrine:   Patient denies excessive thirst.  Musculoskeletal:   Patient denies back pain and joint pain.  Neurological:   Patient denies headaches and dizziness.  Psychologic:   Patient denies depression and anxiety.   VITAL SIGNS:      05/05/2018 11:20 AM  Weight 160 lb / 72.57 kg  Height 66 in / 167.64 cm  BP 148/65 mmHg  Pulse 55 /min  Temperature 97.9 F / 36.6 C  BMI 25.8 kg/m   MULTI-SYSTEM PHYSICAL EXAMINATION:    Constitutional: Well-nourished. No physical deformities. Normally developed. Good grooming.  Respiratory: Normal breath sounds. No labored breathing, no use of accessory muscles.   Cardiovascular: Regular rate and rhythm. No murmur, no gallop.      PAST DATA REVIEWED:  Source Of History:  Patient  Urine Test Review:   Urinalysis   PROCEDURES:          Flexible Cystoscopy - 52000  Risks, benefits, and some of the potential complications of the procedure were discussed.    Meatus:  Normal size. Normal location. Normal condition.  Urethra:  No strictures.  External Sphincter:  Normal.  Verumontanum:  Normal.  Prostate:  Borderline obstructing. Moderate hyperplasia. There are some papillary frond in the proximal prostatic urethra consistent with recurrent disease.   Bladder Neck:  Non-obstructing.  Ureteral Orifices:  Normal location. Normal size. Normal shape. Effluxed clear urine.  Bladder:  Mild trabeculation. No tumors. Normal mucosa. No stones.      The procedure was well tolerated and there were no complications.         Urinalysis - 81003 Dipstick Dipstick Cont'd  Specimen: Voided Bilirubin: Neg  Color: Yellow Ketones: Neg  Appearance: Clear Blood: Neg  Specific Gravity: 1.010 Protein: Trace  pH: 6.0 Urobilinogen: 0.2  Glucose: Neg Nitrites: Neg    Leukocyte Esterase: Neg    ASSESSMENT:      ICD-10 Details  1 GU:   Urethral Cancer - C68.0 Worsening - He has recurrent disease in the prostatic urethra that is consistent with his prior LG  disease. I will get him set up for fulguration and biopsy.   2   History of bladder cancer - Z85.51 I see no bladder recurrences.    PLAN:           Schedule Return Visit/Planned Activity: Next Available Appointment - Schedule Surgery

## 2018-06-09 NOTE — Interval H&P Note (Signed)
History and Physical Interval Note:  06/09/2018 11:53 AM  Scott [name]  has presented today for surgery, with the diagnosis of RECURRENT PROSTATIC URETHRAL TUMORS  The various methods of treatment have been discussed with the patient and family. After consideration of risks, benefits and other options for treatment, the patient has consented to  Procedure(s): CYSTOSCOPY WITH BIOPSY AND FULGURATION (N/A) as a surgical intervention .  The patient's history has been reviewed, patient examined, no change in status, stable for surgery.  I have reviewed the patient's chart and labs.  Questions were answered to the patient's satisfaction.     [name]

## 2018-06-09 NOTE — Anesthesia Preprocedure Evaluation (Signed)
Anesthesia Evaluation  Patient identified by MRN, date of birth, ID band Patient awake    Reviewed: Allergy & Precautions, H&P , NPO status , Patient's Chart, lab work & pertinent test results, reviewed documented beta blocker date and time   Airway Mallampati: II  TM Distance: >3 FB Neck ROM: full    Dental no notable dental hx. (+) Missing, Chipped, Poor Dentition   Pulmonary shortness of breath, pneumonia, COPD, former smoker,    Pulmonary exam normal breath sounds clear to auscultation       Cardiovascular Exercise Tolerance: Good hypertension, + CAD, + Past MI, + Peripheral Vascular Disease and +CHF  + dysrhythmias  Rhythm:regular Rate:Normal     Neuro/Psych PSYCHIATRIC DISORDERS Depression negative neurological ROS     GI/Hepatic Neg liver ROS, GERD  ,  Endo/Other  negative endocrine ROS  Renal/GU Renal disease  negative genitourinary   Musculoskeletal   Abdominal   Peds  Hematology negative hematology ROS (+)   Anesthesia Other Findings   Reproductive/Obstetrics negative OB ROS                             Anesthesia Physical Anesthesia Plan  ASA: IV  Anesthesia Plan: General   Post-op Pain Management:    Induction:   PONV Risk Score and Plan:   Airway Management Planned:   Additional Equipment:   Intra-op Plan:   Post-operative Plan:   Informed Consent: I have reviewed the patients History and Physical, chart, labs and discussed the procedure including the risks, benefits and alternatives for the proposed anesthesia with the patient or authorized representative who has indicated his/her understanding and acceptance.     Dental Advisory Given  Plan Discussed with: CRNA  Anesthesia Plan Comments:         Anesthesia Quick Evaluation

## 2018-06-09 NOTE — Discharge Instructions (Addendum)
CYSTOSCOPY HOME CARE INSTRUCTIONS  Activity: Rest for the remainder of the day.  Do not drive or operate equipment today.  You may resume normal activities in one to two days as instructed by your physician.   Meals: Drink plenty of liquids and eat light foods such as gelatin or soup this evening.  You may return to a normal meal plan tomorrow.  Return to Work: You may return to work in one to two days or as instructed by your physician.  Special Instructions / Symptoms: Call your physician if any of these symptoms occur:   -persistent or heavy bleeding  -bleeding which continues after first few urination  -large blood clots that are difficult to pass  -urine stream diminishes or stops completely  -fever equal to or higher than 101 degrees Farenheit.  -cloudy urine with a strong, foul odor  -severe pain   You may feel some burning pain when you urinate.  This should disappear with time.  Applying moist heat to the lower abdomen or a hot tub bath may help relieve the pain.    PATIENT INSTRUCTIONS POST-ANESTHESIA  IMMEDIATELY FOLLOWING SURGERY:  Do not drive or operate machinery for the first twenty four hours after surgery.  Do not make any important decisions for twenty four hours after surgery or while taking narcotic pain medications or sedatives.  If you develop intractable nausea and vomiting or a severe headache please notify your doctor immediately.  FOLLOW-UP:  Please make an appointment with your surgeon as instructed. You do not need to follow up with anesthesia unless specifically instructed to do so.  WOUND CARE INSTRUCTIONS (if applicable):  Keep a dry clean dressing on the anesthesia/puncture wound site if there is drainage.  Once the wound has quit draining you may leave it open to air.  Generally you should leave the bandage intact for twenty four hours unless there is drainage.  If the epidural site drains for more than 36-48 hours please call the anesthesia  department.  QUESTIONS?:  Please feel free to call your physician or the hospital operator if you have any questions, and they will be happy to assist you.

## 2018-06-09 NOTE — Op Note (Signed)
Procedure: Cystoscopy with biopsy and fulguration of 2 cm bladder neck tumor and fulguration of 1.5 cm right lateral wall bladder tumor.  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  Postop diagnosis: Same with another tumor on the right lateral wall.  Surgeon: Dr. [name].  Anesthesia: General.  Specimen: Biopsy from bladder neck tumor.  Drain: None.  EBL: None.  Complications: None.  Indications: The patient is a 85 year old male with a history of recurrent low-grade urothelial carcinoma who was found to have a recurrence of the bladder neck and proximal prostatic urethra on recent office cystoscopy.  He returns for fulguration and biopsy of the lesions.  Procedure: He was given 2 g of Ancef.  A general anesthetic was induced.  He was placed in lithotomy position and fitted with PAS hose.  His perineum and genitalia were prepped with Betadine solution he was draped in usual sterile fashion.  Cystoscopy was performed using a 23 [place] scope and 30 degree lens.  Examination revealed a normal urethra.  The external sphincter was intact.  The prostatic urethra had evidence of prior resection with lateral lobe enlargement, right greater than left.  In the proximal prostate at the bladder neck there were papillary urothelial lesions consistent with recurrent tumor that extended from approximately 3:00 around to 9:00 posteriorly with an extent of approximately 2 cm..  Inspection of the bladder demonstrated moderate trabeculation with cellules.  There was a 1.5 cm papillary tumor adjacent to a prior resection scar on the right anterior lateral wall and a smaller 5 mm tumor just inferior to that lesion.  Ureteral orifices were unremarkable.  A cup biopsy forceps was used to biopsy 1 of the lesions at the bladder neck.  The Bugbee electrode was then used to generously fulgurate all visible tumor material.  Final inspection revealed no residual tumor fronds and no active  bleeding.  The bladder was drained, he was taken down from lithotomy position, his anesthetic was reversed and he was moved recovery in stable condition.  There were no complications.

## 2018-06-09 NOTE — Anesthesia Postprocedure Evaluation (Signed)
Anesthesia Post Note  Patient: Scott [name]  Procedure(s) Performed: CYSTOSCOPY WITH BIOPSY AND FULGURATION (N/A )  Patient location during evaluation: Short Stay Anesthesia Type: General Level of consciousness: awake and alert and oriented Pain management: pain level controlled Vital Signs Assessment: post-procedure vital signs reviewed and stable Respiratory status: spontaneous breathing Cardiovascular status: stable Postop Assessment: no apparent nausea or vomiting Anesthetic complications: no     Last Vitals:  Vitals:   06/09/18 1330 06/09/18 1342  BP:  112/78  Pulse: 85 83  Resp: 16 16  Temp:  36.7 C  SpO2: 93% 92%    Last Pain:  Vitals:   06/09/18 1342  TempSrc: Oral  PainSc: 0-No pain                 Tanzie Rothschild A

## 2018-06-09 NOTE — Transfer of Care (Signed)
Immediate Anesthesia Transfer of Care Note  Patient: Scott [name]  Procedure(s) Performed: CYSTOSCOPY WITH BIOPSY AND FULGURATION (N/A )  Patient Location: PACU  Anesthesia Type:General  Level of Consciousness: awake, alert , oriented and patient cooperative  Airway & Oxygen Therapy: Patient Spontanous Breathing  Post-op Assessment: Report given to RN and Post -op Vital signs reviewed and stable  Post vital signs: Reviewed and stable  Last Vitals:  Vitals Value Taken Time  BP 138/83 06/09/2018 12:48 PM  Temp    Pulse 88 06/09/2018 12:54 PM  Resp 24 06/09/2018 12:54 PM  SpO2 95 % 06/09/2018 12:54 PM  Vitals shown include unvalidated device data.  Last Pain:  Vitals:   06/09/18 1248  TempSrc:   PainSc: (P) Asleep      Patients Stated Pain Goal: (P) 6 (00/45/99 7741)  Complications: No apparent anesthesia complications

## 2018-06-12 ENCOUNTER — Encounter (HOSPITAL_COMMUNITY): Payer: Self-pay | Admitting: Urology

## 2018-06-16 ENCOUNTER — Ambulatory Visit (INDEPENDENT_AMBULATORY_CARE_PROVIDER_SITE_OTHER): Payer: Medicare Other | Admitting: Urology

## 2018-06-16 DIAGNOSIS — C675 Malignant neoplasm of bladder neck: Secondary | ICD-10-CM | POA: Diagnosis not present

## 2018-06-16 DIAGNOSIS — C672 Malignant neoplasm of lateral wall of bladder: Secondary | ICD-10-CM

## 2018-06-21 ENCOUNTER — Other Ambulatory Visit: Payer: Self-pay | Admitting: Family Medicine

## 2018-06-22 ENCOUNTER — Other Ambulatory Visit: Payer: Self-pay

## 2018-06-22 ENCOUNTER — Encounter (HOSPITAL_COMMUNITY): Payer: Self-pay

## 2018-06-22 ENCOUNTER — Emergency Department (HOSPITAL_COMMUNITY): Payer: Medicare Other

## 2018-06-22 ENCOUNTER — Inpatient Hospital Stay (HOSPITAL_COMMUNITY)
Admission: EM | Admit: 2018-06-22 | Discharge: 2018-06-24 | DRG: 690 | Disposition: A | Discharge status: Home Health | Payer: Medicare Other | Attending: Internal Medicine | Admitting: Internal Medicine

## 2018-06-22 DIAGNOSIS — J9601 Acute respiratory failure with hypoxia: Secondary | ICD-10-CM | POA: Diagnosis not present

## 2018-06-22 DIAGNOSIS — N179 Acute kidney failure, unspecified: Secondary | ICD-10-CM

## 2018-06-22 DIAGNOSIS — H409 Unspecified glaucoma: Secondary | ICD-10-CM | POA: Diagnosis present

## 2018-06-22 DIAGNOSIS — Z86711 Personal history of pulmonary embolism: Secondary | ICD-10-CM | POA: Diagnosis present

## 2018-06-22 DIAGNOSIS — I480 Paroxysmal atrial fibrillation: Secondary | ICD-10-CM | POA: Diagnosis present

## 2018-06-22 DIAGNOSIS — R0689 Other abnormalities of breathing: Secondary | ICD-10-CM | POA: Diagnosis not present

## 2018-06-22 DIAGNOSIS — E860 Dehydration: Secondary | ICD-10-CM | POA: Diagnosis not present

## 2018-06-22 DIAGNOSIS — G8929 Other chronic pain: Secondary | ICD-10-CM | POA: Diagnosis present

## 2018-06-22 DIAGNOSIS — I7025 Atherosclerosis of native arteries of other extremities with ulceration: Secondary | ICD-10-CM | POA: Diagnosis present

## 2018-06-22 DIAGNOSIS — F329 Major depressive disorder, single episode, unspecified: Secondary | ICD-10-CM | POA: Diagnosis present

## 2018-06-22 DIAGNOSIS — R0602 Shortness of breath: Secondary | ICD-10-CM | POA: Diagnosis not present

## 2018-06-22 DIAGNOSIS — K219 Gastro-esophageal reflux disease without esophagitis: Secondary | ICD-10-CM | POA: Diagnosis present

## 2018-06-22 DIAGNOSIS — N400 Enlarged prostate without lower urinary tract symptoms: Secondary | ICD-10-CM | POA: Diagnosis present

## 2018-06-22 DIAGNOSIS — Z8249 Family history of ischemic heart disease and other diseases of the circulatory system: Secondary | ICD-10-CM

## 2018-06-22 DIAGNOSIS — R791 Abnormal coagulation profile: Secondary | ICD-10-CM | POA: Diagnosis present

## 2018-06-22 DIAGNOSIS — I252 Old myocardial infarction: Secondary | ICD-10-CM

## 2018-06-22 DIAGNOSIS — Z8261 Family history of arthritis: Secondary | ICD-10-CM

## 2018-06-22 DIAGNOSIS — E785 Hyperlipidemia, unspecified: Secondary | ICD-10-CM | POA: Diagnosis present

## 2018-06-22 DIAGNOSIS — I2510 Atherosclerotic heart disease of native coronary artery without angina pectoris: Secondary | ICD-10-CM | POA: Diagnosis present

## 2018-06-22 DIAGNOSIS — M545 Low back pain: Secondary | ICD-10-CM | POA: Diagnosis present

## 2018-06-22 DIAGNOSIS — I739 Peripheral vascular disease, unspecified: Secondary | ICD-10-CM | POA: Diagnosis not present

## 2018-06-22 DIAGNOSIS — R509 Fever, unspecified: Secondary | ICD-10-CM

## 2018-06-22 DIAGNOSIS — Z8551 Personal history of malignant neoplasm of bladder: Secondary | ICD-10-CM

## 2018-06-22 DIAGNOSIS — Z89612 Acquired absence of left leg above knee: Secondary | ICD-10-CM

## 2018-06-22 DIAGNOSIS — J208 Acute bronchitis due to other specified organisms: Secondary | ICD-10-CM | POA: Diagnosis present

## 2018-06-22 DIAGNOSIS — I130 Hypertensive heart and chronic kidney disease with heart failure and stage 1 through stage 4 chronic kidney disease, or unspecified chronic kidney disease: Secondary | ICD-10-CM | POA: Diagnosis present

## 2018-06-22 DIAGNOSIS — I70209 Unspecified atherosclerosis of native arteries of extremities, unspecified extremity: Secondary | ICD-10-CM | POA: Diagnosis present

## 2018-06-22 DIAGNOSIS — N390 Urinary tract infection, site not specified: Secondary | ICD-10-CM | POA: Diagnosis present

## 2018-06-22 DIAGNOSIS — R6510 Systemic inflammatory response syndrome (SIRS) of non-infectious origin without acute organ dysfunction: Secondary | ICD-10-CM

## 2018-06-22 DIAGNOSIS — M1990 Unspecified osteoarthritis, unspecified site: Secondary | ICD-10-CM | POA: Diagnosis present

## 2018-06-22 DIAGNOSIS — I5042 Chronic combined systolic (congestive) and diastolic (congestive) heart failure: Secondary | ICD-10-CM | POA: Diagnosis present

## 2018-06-22 DIAGNOSIS — H5462 Unqualified visual loss, left eye, normal vision right eye: Secondary | ICD-10-CM | POA: Diagnosis present

## 2018-06-22 DIAGNOSIS — R05 Cough: Secondary | ICD-10-CM | POA: Diagnosis not present

## 2018-06-22 DIAGNOSIS — I5022 Chronic systolic (congestive) heart failure: Secondary | ICD-10-CM | POA: Diagnosis present

## 2018-06-22 DIAGNOSIS — Z79899 Other long term (current) drug therapy: Secondary | ICD-10-CM

## 2018-06-22 DIAGNOSIS — R000 Tachycardia, unspecified: Secondary | ICD-10-CM | POA: Diagnosis not present

## 2018-06-22 DIAGNOSIS — Z888 Allergy status to other drugs, medicaments and biological substances status: Secondary | ICD-10-CM

## 2018-06-22 DIAGNOSIS — J441 Chronic obstructive pulmonary disease with (acute) exacerbation: Secondary | ICD-10-CM

## 2018-06-22 DIAGNOSIS — I10 Essential (primary) hypertension: Secondary | ICD-10-CM

## 2018-06-22 DIAGNOSIS — Z87891 Personal history of nicotine dependence: Secondary | ICD-10-CM

## 2018-06-22 DIAGNOSIS — N183 Chronic kidney disease, stage 3 (moderate): Secondary | ICD-10-CM | POA: Diagnosis present

## 2018-06-22 DIAGNOSIS — I447 Left bundle-branch block, unspecified: Secondary | ICD-10-CM | POA: Diagnosis present

## 2018-06-22 DIAGNOSIS — Z7901 Long term (current) use of anticoagulants: Secondary | ICD-10-CM

## 2018-06-22 DIAGNOSIS — Z86718 Personal history of other venous thrombosis and embolism: Secondary | ICD-10-CM

## 2018-06-22 DIAGNOSIS — J189 Pneumonia, unspecified organism: Secondary | ICD-10-CM | POA: Diagnosis not present

## 2018-06-22 DIAGNOSIS — R0902 Hypoxemia: Secondary | ICD-10-CM | POA: Diagnosis not present

## 2018-06-22 DIAGNOSIS — L98499 Non-pressure chronic ulcer of skin of other sites with unspecified severity: Secondary | ICD-10-CM | POA: Diagnosis present

## 2018-06-22 DIAGNOSIS — Z66 Do not resuscitate: Secondary | ICD-10-CM | POA: Diagnosis present

## 2018-06-22 DIAGNOSIS — Z7401 Bed confinement status: Secondary | ICD-10-CM

## 2018-06-22 DIAGNOSIS — N1831 Chronic kidney disease, stage 3a: Secondary | ICD-10-CM | POA: Diagnosis present

## 2018-06-22 LAB — CBC WITH DIFFERENTIAL/PLATELET
Abs Immature Granulocytes: 0.02 10*3/uL (ref 0.00–0.07)
Basophils Absolute: 0 10*3/uL (ref 0.0–0.1)
Basophils Relative: 0 %
Eosinophils Absolute: 0 10*3/uL (ref 0.0–0.5)
Eosinophils Relative: 1 %
HCT: 44.9 % (ref 39.0–52.0)
Hemoglobin: 14.3 g/dL (ref 13.0–17.0)
Immature Granulocytes: 0 %
Lymphocytes Relative: 7 %
Lymphs Abs: 0.5 10*3/uL — ABNORMAL LOW (ref 0.7–4.0)
MCH: 27.7 pg (ref 26.0–34.0)
MCHC: 31.8 g/dL (ref 30.0–36.0)
MCV: 86.8 fL (ref 80.0–100.0)
Monocytes Absolute: 0.3 10*3/uL (ref 0.1–1.0)
Monocytes Relative: 5 %
Neutro Abs: 5.5 10*3/uL (ref 1.7–7.7)
Neutrophils Relative %: 87 %
Platelets: 168 10*3/uL (ref 150–400)
RBC: 5.17 MIL/uL (ref 4.22–5.81)
RDW: 14.7 % (ref 11.5–15.5)
WBC: 6.4 10*3/uL (ref 4.0–10.5)
nRBC: 0 % (ref 0.0–0.2)

## 2018-06-22 LAB — URINALYSIS, ROUTINE W REFLEX MICROSCOPIC
Bilirubin Urine: NEGATIVE
Glucose, UA: NEGATIVE mg/dL
Ketones, ur: NEGATIVE mg/dL
Nitrite: NEGATIVE
Protein, ur: NEGATIVE mg/dL
Specific Gravity, Urine: 1.009 (ref 1.005–1.030)
pH: 5 (ref 5.0–8.0)

## 2018-06-22 LAB — PROTIME-INR
INR: 1.7 — ABNORMAL HIGH (ref 0.8–1.2)
Prothrombin Time: 19.4 seconds — ABNORMAL HIGH (ref 11.4–15.2)

## 2018-06-22 LAB — COMPREHENSIVE METABOLIC PANEL
ALT: 13 U/L (ref 0–44)
AST: 20 U/L (ref 15–41)
Albumin: 3.9 g/dL (ref 3.5–5.0)
Alkaline Phosphatase: 191 U/L — ABNORMAL HIGH (ref 38–126)
Anion gap: 8 (ref 5–15)
BUN: 31 mg/dL — ABNORMAL HIGH (ref 8–23)
CO2: 24 mmol/L (ref 22–32)
Calcium: 9.3 mg/dL (ref 8.9–10.3)
Chloride: 107 mmol/L (ref 98–111)
Creatinine, Ser: 1.46 mg/dL — ABNORMAL HIGH (ref 0.61–1.24)
GFR calc Af Amer: 47 mL/min — ABNORMAL LOW (ref >60)
GFR calc non Af Amer: 41 mL/min — ABNORMAL LOW (ref >60)
Glucose, Bld: 95 mg/dL (ref 70–99)
Potassium: 4.3 mmol/L (ref 3.5–5.1)
Sodium: 139 mmol/L (ref 135–145)
Total Bilirubin: 0.5 mg/dL (ref 0.3–1.2)
Total Protein: 7.1 g/dL (ref 6.5–8.1)

## 2018-06-22 LAB — LACTIC ACID, PLASMA
Lactic Acid, Venous: 1.1 mmol/L (ref 0.5–1.9)
Lactic Acid, Venous: 0.9 mmol/L (ref 0.5–1.9)

## 2018-06-22 LAB — TROPONIN I: Troponin I: 0.03 ng/mL (ref <0.03)

## 2018-06-22 LAB — INFLUENZA PANEL BY PCR (TYPE A & B)
Influenza A By PCR: NEGATIVE
Influenza B By PCR: NEGATIVE

## 2018-06-22 LAB — BRAIN NATRIURETIC PEPTIDE: B Natriuretic Peptide: 1477 pg/mL — ABNORMAL HIGH (ref 0.0–100.0)

## 2018-06-22 MED ORDER — SODIUM CHLORIDE 0.9 % IV SOLN
INTRAVENOUS | Status: DC
  Administered 2018-06-23: 01:00:00 via INTRAVENOUS

## 2018-06-22 MED ORDER — ALBUTEROL SULFATE (2.5 MG/3ML) 0.083% IN NEBU: 2.5000 mg | INHALATION_SOLUTION | Freq: Four times a day (QID) | RESPIRATORY_TRACT | Status: DC | PRN

## 2018-06-22 MED ORDER — WARFARIN SODIUM 1 MG PO TABS
1.5000 mg | ORAL_TABLET | Freq: Once | ORAL | Status: DC
  Filled 2018-06-22: qty 1

## 2018-06-22 MED ORDER — SODIUM CHLORIDE 0.9 % IV SOLN
1.0000 g | Freq: Once | INTRAVENOUS | Status: AC
  Administered 2018-06-22: 1 g via INTRAVENOUS
  Filled 2018-06-22: qty 10

## 2018-06-22 MED ORDER — FOLIC ACID 1 MG PO TABS
1.0000 mg | ORAL_TABLET | Freq: Every day | ORAL | Status: DC
  Administered 2018-06-23 – 2018-06-24 (×2): 1 mg via ORAL
  Filled 2018-06-22 (×3): qty 1

## 2018-06-22 MED ORDER — MIRABEGRON ER 25 MG PO TB24
25.0000 mg | ORAL_TABLET | Freq: Every morning | ORAL | Status: DC
  Administered 2018-06-23 – 2018-06-24 (×2): 25 mg via ORAL
  Filled 2018-06-22 (×3): qty 1

## 2018-06-22 MED ORDER — GABAPENTIN 100 MG PO CAPS
200.0000 mg | ORAL_CAPSULE | Freq: Two times a day (BID) | ORAL | Status: DC
  Administered 2018-06-23 – 2018-06-24 (×4): 200 mg via ORAL
  Filled 2018-06-22 (×5): qty 2

## 2018-06-22 MED ORDER — SERTRALINE HCL 50 MG PO TABS
50.0000 mg | ORAL_TABLET | Freq: Every morning | ORAL | Status: DC
  Administered 2018-06-23 – 2018-06-24 (×2): 50 mg via ORAL
  Filled 2018-06-22 (×3): qty 1

## 2018-06-22 MED ORDER — ALBUTEROL SULFATE (2.5 MG/3ML) 0.083% IN NEBU: 3.0000 mL | INHALATION_SOLUTION | Freq: Four times a day (QID) | RESPIRATORY_TRACT | Status: DC | PRN

## 2018-06-22 MED ORDER — ACETAMINOPHEN 650 MG RE SUPP: 650.0000 mg | Freq: Four times a day (QID) | RECTAL | Status: DC | PRN

## 2018-06-22 MED ORDER — ONDANSETRON HCL 4 MG PO TABS: 4.0000 mg | ORAL_TABLET | Freq: Four times a day (QID) | ORAL | Status: DC | PRN

## 2018-06-22 MED ORDER — DM-GUAIFENESIN ER 30-600 MG PO TB12
1.0000 | ORAL_TABLET | Freq: Two times a day (BID) | ORAL | Status: DC
  Administered 2018-06-23 – 2018-06-24 (×4): 1 via ORAL
  Filled 2018-06-22 (×4): qty 1

## 2018-06-22 MED ORDER — ENOXAPARIN SODIUM 40 MG/0.4ML ~~LOC~~ SOLN: 40.0000 mg | SUBCUTANEOUS | Status: DC

## 2018-06-22 MED ORDER — GUAIFENESIN-DM 100-10 MG/5ML PO SYRP
5.0000 mL | ORAL_SOLUTION | ORAL | Status: DC | PRN
  Administered 2018-06-23: 5 mL via ORAL
  Filled 2018-06-22: qty 5

## 2018-06-22 MED ORDER — FERROUS SULFATE 325 (65 FE) MG PO TABS
325.0000 mg | ORAL_TABLET | Freq: Every day | ORAL | Status: DC
  Administered 2018-06-23 – 2018-06-24 (×2): 325 mg via ORAL
  Filled 2018-06-22 (×3): qty 1

## 2018-06-22 MED ORDER — ALBUTEROL SULFATE (2.5 MG/3ML) 0.083% IN NEBU
2.5000 mg | INHALATION_SOLUTION | Freq: Four times a day (QID) | RESPIRATORY_TRACT | Status: DC
  Administered 2018-06-23 (×3): 2.5 mg via RESPIRATORY_TRACT
  Filled 2018-06-22 (×3): qty 3

## 2018-06-22 MED ORDER — FEBUXOSTAT 40 MG PO TABS
40.0000 mg | ORAL_TABLET | Freq: Every day | ORAL | Status: DC
  Administered 2018-06-23 – 2018-06-24 (×2): 40 mg via ORAL
  Filled 2018-06-22 (×3): qty 1

## 2018-06-22 MED ORDER — SODIUM CHLORIDE 0.9% FLUSH
3.0000 mL | Freq: Once | INTRAVENOUS | Status: AC
  Administered 2018-06-22: 3 mL via INTRAVENOUS

## 2018-06-22 MED ORDER — METHYLPREDNISOLONE SODIUM SUCC 40 MG IJ SOLR
40.0000 mg | Freq: Two times a day (BID) | INTRAMUSCULAR | Status: DC
  Administered 2018-06-23 – 2018-06-24 (×4): 40 mg via INTRAVENOUS
  Filled 2018-06-22 (×5): qty 1

## 2018-06-22 MED ORDER — ACETAMINOPHEN 325 MG PO TABS: 650.0000 mg | ORAL_TABLET | Freq: Four times a day (QID) | ORAL | Status: DC | PRN

## 2018-06-22 MED ORDER — METOPROLOL SUCCINATE ER 50 MG PO TB24
50.0000 mg | ORAL_TABLET | Freq: Every day | ORAL | Status: DC
  Administered 2018-06-24: 50 mg via ORAL
  Filled 2018-06-22 (×3): qty 1

## 2018-06-22 MED ORDER — SIMVASTATIN 10 MG PO TABS
10.0000 mg | ORAL_TABLET | Freq: Every day | ORAL | Status: DC
  Administered 2018-06-23 (×2): 10 mg via ORAL
  Filled 2018-06-22 (×2): qty 1

## 2018-06-22 MED ORDER — ONDANSETRON HCL 4 MG/2ML IJ SOLN: 4.0000 mg | Freq: Four times a day (QID) | INTRAMUSCULAR | Status: DC | PRN

## 2018-06-22 MED ORDER — IPRATROPIUM-ALBUTEROL 0.5-2.5 (3) MG/3ML IN SOLN
3.0000 mL | RESPIRATORY_TRACT | Status: DC | PRN
  Administered 2018-06-23: 3 mL via RESPIRATORY_TRACT

## 2018-06-22 MED ORDER — ISOSORBIDE DINITRATE 20 MG PO TABS
30.0000 mg | ORAL_TABLET | Freq: Every day | ORAL | Status: DC
  Administered 2018-06-23 – 2018-06-24 (×2): 30 mg via ORAL
  Filled 2018-06-22 (×3): qty 2

## 2018-06-22 MED ORDER — WARFARIN - PHARMACIST DOSING INPATIENT: Freq: Every day | Status: DC

## 2018-06-22 MED ORDER — FUROSEMIDE 40 MG PO TABS
40.0000 mg | ORAL_TABLET | Freq: Every day | ORAL | Status: DC
  Filled 2018-06-22 (×2): qty 1

## 2018-06-22 MED ORDER — ACETAMINOPHEN 500 MG PO TABS
1000.0000 mg | ORAL_TABLET | Freq: Once | ORAL | Status: AC
  Administered 2018-06-22: 1000 mg via ORAL
  Filled 2018-06-22: qty 2

## 2018-06-22 MED ORDER — NITROGLYCERIN 0.4 MG SL SUBL: 0.4000 mg | SUBLINGUAL_TABLET | SUBLINGUAL | Status: DC | PRN

## 2018-06-22 MED ORDER — FLUTICASONE PROPIONATE 50 MCG/ACT NA SUSP: 1.0000 | Freq: Every day | NASAL | Status: DC | PRN

## 2018-06-22 MED ORDER — PANTOPRAZOLE SODIUM 40 MG PO TBEC
40.0000 mg | DELAYED_RELEASE_TABLET | Freq: Every day | ORAL | Status: DC
  Administered 2018-06-23 – 2018-06-24 (×2): 40 mg via ORAL
  Filled 2018-06-22 (×3): qty 1

## 2018-06-22 MED ORDER — DONEPEZIL HCL 5 MG PO TABS
10.0000 mg | ORAL_TABLET | Freq: Every day | ORAL | Status: DC
  Administered 2018-06-23 (×2): 10 mg via ORAL
  Filled 2018-06-22 (×2): qty 2

## 2018-06-22 MED ORDER — SODIUM CHLORIDE 0.9 % IV BOLUS
500.0000 mL | Freq: Once | INTRAVENOUS | Status: AC
  Administered 2018-06-22: 500 mL via INTRAVENOUS

## 2018-06-22 MED ORDER — SODIUM CHLORIDE 0.9 % IV SOLN
INTRAVENOUS | Status: DC | PRN
  Administered 2018-06-22: 22:00:00 via INTRAVENOUS

## 2018-06-22 NOTE — ED Triage Notes (Signed)
Pts in home nurse came into to check on pt. Pt noted to be SOB and coughing with temp 102. Fire dept had pt on NRB and sats 94 when EMS arrived . sats decreased to 86 on room. Pt noted to be wheezing and diminished. Pt given albuterol and solumedrol

## 2018-06-22 NOTE — ED Provider Notes (Signed)
[facility] EMERGENCY DEPARTMENT Provider Note   CSN: [id number] Arrival date & time: 06/22/18  1733    History   Chief Complaint Chief Complaint  Patient presents with  . Shortness of Breath    HPI SHUAIB CORSINO is a 85 y.o. male.     Patient is a 85 year old male with history of atrial fibrillation, COPD, GERD, DVT/PE, hypertension.  He presents today for evaluation of cough and fever for the past 2 days.  He did feels somewhat short of breath.  Cough has been intermittently productive of yellow sputum.  The history is provided by the patient.  Shortness of Breath  Severity:  Moderate Onset quality:  Gradual Duration:  2 days Timing:  Constant Progression:  Worsening Chronicity:  New Relieved by:  Nothing Worsened by:  Nothing Ineffective treatments:  None tried Associated symptoms: cough, fever and sputum production     Past Medical History:  Diagnosis Date  . Arthritis    "all over"  . Atrial fibrillation ([place])   . Benign localized prostatic hyperplasia with lower urinary tract symptoms (LUTS)   . Bradycardia 11/28/2015   Severe-HR in the 20s to 30s following intubation for urological procedure.  Marland Kitchen CAD- non obstructive disease by cath 3/14 cardiologist-  dr hochrein  . Chronic lower back pain   . COPD (chronic obstructive pulmonary disease) ([place])   . DDD (degenerative disc disease)   . Depression   . Dyspnea    w/ exertion and lying down (raise head)  . ETOH abuse   . GERD (gastroesophageal reflux disease)   . Glaucoma   . Glaucoma, both eyes   . History of bladder cancer urologist-- dr Jeffie Pollock   first dx 2012--  s/p TURBT's  . History of DVT of lower extremity 03/2013   bilateral   . History of pulmonary embolus (PE) 04/2013  . HTN (hypertension)   . Hyperlipidemia   . Hypertension   . LBBB (left bundle branch block)    chronic  . Non-ischemic cardiomyopathy ([place])    last echo 01-18-2017, ef 35-40%  . NSVT (nonsustained ventricular tachycardia)  ([place])    a. NSVT 06/2012; NSVT also seen during 03/2013 adm. b. Med rx. Not candidate for ICD given adv age.  Marland Kitchen PAD (peripheral artery disease) ([place])   . 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 PVCs (premature ventricular contractions)   . PVD (peripheral vascular disease) (HCC)    Right ABI .75, Left .78 (2006)  . Syncope 06/2012   a. Felt to be postural syncope related to diuretics 06/2012.  Marland Kitchen Systolic and diastolic CHF, chronic (HCC) cardiologsit-  dr hochrein   a. NICM - patent cors 06/2012, EF 40% at that time. b. 03/2013 eval: EF 20-25%.  . Vision loss, left eye    due to glaucoma  . Wears glasses     Patient Active Problem List   Diagnosis Date Noted  . Paroxysmal atrial fibrillation ([place]) 01/18/2017  . PVC (pulmonary venous congestion) 01/18/2017  . Thoracic aortic atherosclerosis ([place]) 11/26/2016  . Thrombocytopenia ([place]) 08/04/2016  . Chronic kidney disease (CKD) stage G3a/A1, moderately decreased glomerular filtration rate (GFR) between 45-59 mL/min/1.73 square meter and albuminuria creatinine ratio less than 30 mg/g (HCC) 08/04/2016  . Essential hypertension 11/29/2015  . Bradycardia 11/28/2015  . Malignant neoplasm of lateral wall of urinary bladder ([place]) 07/10/2015  . Status post above knee amputation of left lower extremity  10/18/2014  . Tachyarrhythmia 10/05/2014  . Aspiration pneumonia ([place]) 10/05/2014  . UTI (lower urinary tract infection) 10/03/2014  . Sepsis due to other etiology ([place]) 10/03/2014  . Gangrene of toe ([place]) 10/03/2014  . Ischemic ulcer of left foot ([place]) 10/03/2014  . Atherosclerotic PVD with ulceration ([place]) 10/03/2014  . Warfarin-induced coagulopathy ([place]) 10/03/2014  . Atrial fibrillation with RVR ([place]) 10/03/2014  . Acute kidney injury ([place]) 10/03/2014  . Hyperkalemia 10/03/2014  . Cellulitis of left leg 10/03/2014  . PAD (peripheral artery disease)  ([place]) 09/24/2014  . NSTEMI (non-ST elevated myocardial infarction) ([place]) 10/09/2013  . High risk medication use 05/17/2013  . BPH (benign prostatic hyperplasia) 05/17/2013  . Vitamin D deficiency 05/17/2013  . History of pulmonary embolism (2014) 04/17/2013  . NSVT (nonsustained ventricular tachycardia)- not an ICD candidate 04/17/2013  . NICM (nonischemic cardiomyopathy)- EF 20-25% by Echo 04/12/13 04/17/2013  . Chronic combined systolic and diastolic heart failure ([place]) 03/15/2013  . DVT of lower extremity, bilateral ([place]) 03/15/2013  . Fatigue 11/02/2012  . Constipation 11/02/2012  . Alcohol abuse 08/27/2012  . Syncope 06/21/2012  . Bladder cancer ([place]) 03/15/2012  . Benign localized hyperplasia of prostate with urinary obstruction 03/15/2012  . Benign essential HTN   . Hyperlipidemia   . PVCs (premature ventricular contractions)   . CAD- non obstructive disease by cath 3/14     Past Surgical History:  Procedure Laterality Date  . AMPUTATION Left 10/16/2014   Procedure: AMPUTATION ABOVE KNEE- LEFT;  Surgeon: Mal Misty, MD;  Location: [facility];  Service: Vascular;  Laterality: Left;  . CARDIAC CATHETERIZATION  05-11-2004  DR [facility]   MINIMAL CORANARY PLAQUE/ NORMAL LVF/ EF 55%/  NON-OBSTRUCTIVE LAD 25%  . CARDIAC CATHETERIZATION  06/18/2012   dr hochrein   mild luminal irregularities of coronaries, ef 45-50%  . CARDIOVASCULAR STRESS TEST  10-15-2010   LOW RISK NUCLEAR STUDY/ NO EVIDENCE OF ISCHEMIA/ NORMAL EF  . CATARACT EXTRACTION W/ INTRAOCULAR LENS  IMPLANT, BILATERAL Bilateral   . CYSTOSCOPY  10/07/2011   Procedure: CYSTOSCOPY;  Surgeon: Malka So, MD;  Location[facility];  Service: Urology;  Laterality: N/A;  . CYSTOSCOPY WITH BIOPSY  03/14/2012   Procedure: CYSTOSCOPY WITH BIOPSY;  Surgeon: Malka So, MD;  Location: [facility];  Service: Urology;  Laterality: N/A;  WITH FULGURATION  . CYSTOSCOPY WITH BIOPSY N/A 12/09/2016   Procedure: CYSTOSCOPY WITH  BIOPSY AND FULGURATION;  Surgeon: [name], MD;  Location: [facility];  Service: Urology;  Laterality: N/A;  . CYSTOSCOPY WITH BIOPSY N/A 12/20/2017   Procedure: CYSTOSCOPY WITH BIOPSY WITH FULGURATION;  Surgeon: [name], MD;  Location: [facility];  Service: Urology;  Laterality: N/A;  . CYSTOSCOPY WITH BIOPSY N/A 06/09/2018   Procedure: CYSTOSCOPY WITH BIOPSY AND FULGURATION;  Surgeon: [name], MD;  Location: [facility];  Service: Urology;  Laterality: N/A;  . CYSTOSCOPY WITH FULGERATION N/A 01/20/2016   Procedure: CYSTOSCOPY, BIOPSY WITH FULGERATION OF URTHRAL TUMOR;  Surgeon: [name], MD;  Location: [facility];  Service: Urology;  Laterality: N/A;  . CYSTOSCOPY WITH FULGERATION N/A 06/01/2016   Procedure: CYSTOSCOPY WITH FULGERATION urethral tumors and bladder neck;  Surgeon: [name], MD;  Location: [facility];  Service: Urology;  Laterality: N/A;  . SHOULDER SURGERY  1970's  . TONSILLECTOMY    . TRANSTHORACIC ECHOCARDIOGRAM  01-18-2017   dr hochrein   moderate LVH, ef 35-40%, diffuse hypokinesis/  trivial AR and MR/  mild TR  . TRANSURETHRAL RESECTION OF  BLADDER TUMOR  10/07/2011   Procedure: TRANSURETHRAL RESECTION OF BLADDER TUMOR (TURBT);  Surgeon: Malka So, MD;  Location: [facility];  Service: Urology;  Laterality: N/A;  . TRANSURETHRAL RESECTION OF BLADDER TUMOR N/A 07/10/2015   Procedure: TRANSURETHRAL RESECTION OF BLADDER TUMOR (TURBT), CYSTOSCOPY ;  Surgeon: [name], MD;  Location: [facility];  Service: Urology;  Laterality: N/A;  . TRANSURETHRAL RESECTION OF PROSTATE  03/14/2012   Procedure: TRANSURETHRAL RESECTION OF THE PROSTATE WITH GYRUS INSTRUMENTS;  Surgeon: Malka So, MD;  Location: [facility];  Service: Urology;  Laterality: N/A;        Home Medications    Prior to Admission medications   Medication Sig Start Date End Date Taking? Authorizing Provider  albuterol (PROVENTIL) (2.5 MG/3ML) 0.083% nebulizer solution Take 3 mLs (2.5 mg total) by nebulization every  6 (six) hours as needed for wheezing or shortness of breath. 04/06/18   Hawks, [name] A, FNP  COMBIVENT RESPIMAT 20-100 MCG/ACT AERS respimat 1 PUFF EVERY 4 HOURS AS NEEDED FOR COUGH, WHEEZING, OR SHORTNESS OF BREATH Patient taking differently: Inhale 1 puff into the lungs every 4 (four) hours as needed for wheezing or shortness of breath.  04/06/18   Dettinger, Fransisca Kaufmann, MD  donepezil (ARICEPT) 10 MG tablet TAKE ONE TABLET AT BEDTIME Patient taking differently: Take 10 mg by mouth at bedtime.  03/17/18   Dettinger, Fransisca Kaufmann, MD  febuxostat (ULORIC) 40 MG tablet TAKE 1 TABLET DAILY Patient taking differently: Take 40 mg by mouth daily.  03/03/18   Dettinger, Fransisca Kaufmann, MD  FEROSUL 325 (65 Fe) MG tablet Take 1 tablet (325 mg total) by mouth daily with breakfast. Patient taking differently: Take 325 mg by mouth daily with breakfast.  03/17/18   Dettinger, Fransisca Kaufmann, MD  fluticasone (FLONASE) 50 MCG/ACT nasal spray Place 1 spray into both nostrils daily as needed for allergies or rhinitis. 11/25/16   Sharion Balloon, FNP  folic acid (FOLVITE) 1 MG tablet TAKE 1 TABLET DAILY Patient taking differently: Take 1 mg by mouth daily.  03/03/18   Dettinger, Fransisca Kaufmann, MD  furosemide (LASIX) 40 MG tablet Take 1 tablet (40 mg total) by mouth 2 (two) times daily. 05/02/18   Dettinger, Fransisca Kaufmann, MD  gabapentin (NEURONTIN) 100 MG capsule TAKE 2 CAPSULES TWICE A DAY Patient taking differently: Take 200 mg by mouth 2 (two) times daily.  05/25/18   Dettinger, Fransisca Kaufmann, MD  isosorbide dinitrate (ISORDIL) 30 MG tablet TAKE 1 TABLET ONCE A DAY Patient taking differently: Take 30 mg by mouth daily.  03/17/18   Chipper Herb, MD  metoprolol succinate (TOPROL-XL) 50 MG 24 hr tablet Take 1 tablet (50 mg total) by mouth daily. (Appt w/ PCP for blood pressure) 06/05/18   Dettinger, Fransisca Kaufmann, MD  MYRBETRIQ 25 MG TB24 tablet Take 25 mg by mouth daily.  09/20/16   [provider]  nitroGLYCERIN (NITROSTAT) 0.4 MG SL tablet  Place 1 tablet (0.4 mg total) under the tongue every 5 (five) minutes as needed for chest pain (up to 3 doses). 04/19/13   Dunn, Nedra Hai, PA-C  pantoprazole (PROTONIX) 40 MG tablet TAKE 1 TABLET DAILY Patient taking differently: Take 40 mg by mouth daily.  04/06/18   Evelina Dun A, FNP  PROAIR HFA 108 (90 Base) MCG/ACT inhaler 2 PUFFS EVERY 6 HOURS AS NEEDED FOR WHEEZING OR SHORTNESS OF BREATH Patient taking differently: Inhale 2 puffs into the lungs every 6 (six) hours as needed for  wheezing or shortness of breath.  05/02/18   Dettinger, Fransisca Kaufmann, MD  sertraline (ZOLOFT) 50 MG tablet TAKE 1 TABLET IN THE MORNING 06/21/18   Dettinger, Fransisca Kaufmann, MD  simvastatin (ZOCOR) 10 MG tablet TAKE 1 TABLET ONCE DAILY IN THE EVENING 06/08/18   Dettinger, Fransisca Kaufmann, MD  Spacer/Aero Chamber Mouthpiece MISC Use with Albuterol [facility] inhaler as directed 01/03/17   Chipper Herb, MD  warfarin (COUMADIN) 2.5 MG tablet Take 5 mg Friday March 29, then take 2.5 mg daily except 5 mg each Monday. Patient taking differently: Take 2.5-5 mg by mouth See admin instructions. Take 2.5 mg by mouth once daily except on Mondays and Fridays take 5 mg once daily 08/05/17   Chipper Herb, MD    Family History Family History  Problem Relation Age of Onset  . Hypertension Mother   . Arthritis Mother   . Lung cancer Sister   . Deep vein thrombosis Father   . Early death Brother     Social History Social History   Tobacco Use  . Smoking status: Former Smoker    Packs/day: 1.00    Years: 35.00    Pack years: 35.00    Types: Cigarettes    Start date: 04/12/1942    Last attempt to quit: 04/13/1975    Years since quitting: 43.2  . Smokeless tobacco: Never Used  Substance Use Topics  . Alcohol use: Yes    Alcohol/week: 7.0 standard drinks    Types: 7 Cans of beer per week  . Drug use: No     Allergies   Lipitor [atorvastatin]   Review of Systems Review of Systems  Constitutional: Positive for fever.  Respiratory:  Positive for cough, sputum production and shortness of breath.   All other systems reviewed and are negative.    Physical Exam Updated Vital Signs BP (!) 151/80 (BP Location: Right Arm)   Pulse 84   Temp (!) 103.1 F (39.5 C) (Oral)   Resp (!) 30   Ht 5\' 7"  (1.702 m)   Wt 72 kg   SpO2 94%   BMI 24.86 kg/m   Physical Exam Vitals signs and nursing note reviewed.  Constitutional:      General: He is not in acute distress.    Appearance: He is well-developed. He is not diaphoretic.  HENT:     Head: Normocephalic and atraumatic.  Neck:     Musculoskeletal: Normal range of motion and neck supple.  Cardiovascular:     Rate and Rhythm: Normal rate and regular rhythm.     Heart sounds: No murmur. No friction rub.  Pulmonary:     Effort: Pulmonary effort is normal. No respiratory distress.     Breath sounds: Examination of the right-lower field reveals rales. Examination of the left-lower field reveals rales. Rales present. No wheezing.  Abdominal:     General: Bowel sounds are normal. There is no distension.     Palpations: Abdomen is soft.     Tenderness: There is no abdominal tenderness.  Musculoskeletal: Normal range of motion.     Right lower leg: No edema.     Left lower leg: No edema.  Skin:    General: Skin is warm and dry.  Neurological:     Mental Status: He is alert and oriented to person, place, and time.     Coordination: Coordination normal.      ED Treatments / Results  Labs (all labs ordered are listed, but only abnormal results are  displayed) Labs Reviewed  CULTURE, BLOOD (ROUTINE X 2)  CULTURE, BLOOD (ROUTINE X 2)  URINE CULTURE  COMPREHENSIVE METABOLIC PANEL  LACTIC ACID, PLASMA  LACTIC ACID, PLASMA  CBC WITH DIFFERENTIAL/PLATELET  PROTIME-INR  URINALYSIS, ROUTINE W REFLEX MICROSCOPIC  BRAIN NATRIURETIC PEPTIDE  TROPONIN I    EKG None  Radiology No results found.  Procedures Procedures (including critical care time)  Medications  Ordered in ED Medications  sodium chloride flush (NS) 0.9 % injection 3 mL (has no administration in time range)  sodium chloride 0.9 % bolus 500 mL (has no administration in time range)     Initial Impression / Assessment and Plan / ED Course  I have reviewed the triage vital signs and the nursing notes.  Pertinent labs & imaging results that were available during my care of the patient were reviewed by me and considered in my medical decision making (see chart for details).  Patient brought for evaluation of fever of 103.  He has been experiencing cough and chest congestion for the past several days.  His chest x-ray shows no infiltrate and influenza swab is negative.  He has no elevation of white count and laboratory studies are otherwise reassuring.  He does have the suggestion of a urinary tract infection.  He will be given IV Rocephin and admitted to the hospitalist service under the care of Dr. Clementeen Graham.  Final Clinical Impressions(s) / ED Diagnoses   Final diagnoses:  None    ED Discharge Orders    None       Veryl Speak, MD 06/22/18 2147

## 2018-06-22 NOTE — H&P (Signed)
TRH H&P   Patient Demographics:    Scott [name], is a 85 y.o. male  MRN: [id number]   DOB - Aug 18, 1921  Admit Date - 06/22/2018  Outpatient Primary MD for the patient is Dettinger, Fransisca Kaufmann, MD  Referring MD: Dr Stark Jock  Outpatient Specialists: Dr. Jeffie Pollock (urology), [facility] cardiology  Patient coming from: Home  Chief Complaint  Patient presents with  . Shortness of Breath      HPI:    Scott [name]  is a 85 y.o. male, with history of chronic combined systolic and diastolic CHF (nonischemic cardiomyopathy with EF of 35 and 40% as per last echo in 2018), COPD (not on home O2), history of DVT and PE, paroxysmal A. fib on chronic Coumadin, PVD with left AKA, CAD,, GERD, glaucoma, hypertension, hyperlipidemia, BPH, history of bladder cancer with recent cystoscopy with biopsy and fulguration of bladder neck tumor on 2/28 was brought from home by his son and his nephew with fever and cough since last night. Patient lives home alone and has caregiver and family close by.  Since last night he was complaining of cough with congestion and fever.  His nephew checks his temperature this morning and was 102 F.  Patient was also found to be slightly confused.  He denies any headache, blurred vision, dizziness, nausea, vomiting, chest pain, palpitations, orthopnea, PND, abdominal pain, dysuria, diarrhea, weakness or numbness in his extremities.  No history of sick contact or recent travel.  His son has been in and out of the state but has been in good health.  Denies any change in his medication recently. The fire department put patient on NRB and patient was satting at 94% when EMS arrived which dropped to 86% on room air.  He was given albuterol and IV Solu-Medrol and brought to the ED.  Course in the ED patient was febrile with temperature of 103 F, tachypneic and elevated blood pressure 165/87 mmHg,  sats were 99 -100% on 2 L via nasal cannula.  Blood work showed normal WBC, hemoglobin and platelets.  Chemistry was unremarkable except for mild acute kidney injury with creatinine 1.46 (baseline creatinine around 1.2-1.3.).  Chest x-ray negative for any infiltrate.  Lactic acid was normal.  Flu PCR was negative. Blood culture ordered in the ED.  UA was suggestive of UTI.  Patient given a dose of IV Rocephin and hospitalist consulted for admission to medical floor for SIRS secondary to UTI versus acute viral bronchitis versus COPD exacerbation.   Per nephew at baseline patient is quite alert and oriented.      Review of systems:    In addition to the HPI above,  Fevers + +, chills + No Headache, No changes with Vision or hearing, No problems swallowing food or Liquids, No Chest pain, Cough and shortness of Breath, No Abdominal pain, No Nausea or vomiting, Bowel movements are regular,  No Blood in stool or Urine, No dysuria, No new skin rashes or bruises, No new joints pains-aches,  No new weakness, tingling, numbness in any extremity, No recent weight gain or loss, No polyuria, polydypsia or polyphagia, No significant Mental Stressors.    With Past History of the following :    Past Medical History:  Diagnosis Date  . Arthritis    "all over"  . Atrial fibrillation ([place])   . Benign localized prostatic hyperplasia with lower urinary tract symptoms (LUTS)   . Bradycardia 11/28/2015   Severe-HR in the 20s to 30s following intubation for urological procedure.  Marland Kitchen CAD- non obstructive disease by cath 3/14 cardiologist-  dr hochrein  . Chronic lower back pain   . COPD (chronic obstructive pulmonary disease) ([place])   . DDD (degenerative disc disease)   . Depression   . Dyspnea    w/ exertion and lying down (raise head)  . ETOH abuse   . GERD (gastroesophageal reflux disease)   . Glaucoma   . Glaucoma, both eyes   . History of bladder cancer urologist-- dr Jeffie Pollock   first dx 2012--   s/p TURBT's  . History of DVT of lower extremity 03/2013   bilateral   . History of pulmonary embolus (PE) 04/2013  . HTN (hypertension)   . Hyperlipidemia   . Hypertension   . LBBB (left bundle branch block)    chronic  . Non-ischemic cardiomyopathy ([place])    last echo 01-18-2017, ef 35-40%  . NSVT (nonsustained ventricular tachycardia) ([place])    a. NSVT 06/2012; NSVT also seen during 03/2013 adm. b. Med rx. Not candidate for ICD given adv age.  Marland Kitchen PAD (peripheral artery disease) ([place])   . 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 PVCs (premature ventricular contractions)   . PVD (peripheral vascular disease) (HCC)    Right ABI .75, Left .78 (2006)  . Syncope 06/2012   a. Felt to be postural syncope related to diuretics 06/2012.  Marland Kitchen Systolic and diastolic CHF, chronic (HCC) cardiologsit-  dr hochrein   a. NICM - patent cors 06/2012, EF 40% at that time. b. 03/2013 eval: EF 20-25%.  . Vision loss, left eye    due to glaucoma  . Wears glasses       Past Surgical History:  Procedure Laterality Date  . AMPUTATION Left 10/16/2014   Procedure: AMPUTATION ABOVE KNEE- LEFT;  Surgeon: Mal Misty, MD;  Location: [place];  Service: Vascular;  Laterality: Left;  . CARDIAC CATHETERIZATION  05-11-2004  DR [facility]   MINIMAL CORANARY PLAQUE/ NORMAL LVF/ EF 55%/  NON-OBSTRUCTIVE LAD 25%  . CARDIAC CATHETERIZATION  06/18/2012   dr hochrein   mild luminal irregularities of coronaries, ef 45-50%  . CARDIOVASCULAR STRESS TEST  10-15-2010   LOW RISK NUCLEAR STUDY/ NO EVIDENCE OF ISCHEMIA/ NORMAL EF  . CATARACT EXTRACTION W/ INTRAOCULAR LENS  IMPLANT, BILATERAL Bilateral   . CYSTOSCOPY  10/07/2011   Procedure: CYSTOSCOPY;  Surgeon: Malka So, MD;  [facility];  Service: Urology;  Laterality: N/A;  . CYSTOSCOPY WITH BIOPSY  03/14/2012   Procedure: CYSTOSCOPY WITH BIOPSY;  Surgeon: Malka So, MD;  Location: [facility];  Service: Urology;  Laterality: N/A;  WITH FULGURATION  . CYSTOSCOPY WITH BIOPSY N/A 12/09/2016   Procedure: CYSTOSCOPY WITH BIOPSY AND FULGURATION;  Surgeon: [name], MD;  Location: [facility];  Service: Urology;  Laterality: N/A;  . CYSTOSCOPY WITH BIOPSY N/A 12/20/2017   Procedure: CYSTOSCOPY WITH BIOPSY WITH FULGURATION;  Surgeon: [name], MD;  Location: [facility];  Service: Urology;  Laterality: N/A;  . CYSTOSCOPY WITH BIOPSY N/A 06/09/2018   Procedure: CYSTOSCOPY WITH BIOPSY AND FULGURATION;  Surgeon: [name], MD;  Location: [facility];  Service: Urology;  Laterality: N/A;  . CYSTOSCOPY WITH FULGERATION N/A 01/20/2016   Procedure: CYSTOSCOPY, BIOPSY WITH FULGERATION OF URTHRAL TUMOR;  Surgeon: [name], MD;  Location: [facility];  Service: Urology;  Laterality: N/A;  . CYSTOSCOPY WITH FULGERATION N/A 06/01/2016   Procedure: CYSTOSCOPY WITH FULGERATION urethral tumors and bladder neck;  Surgeon: [name], MD;  Location: [facility];  Service: Urology;  Laterality: N/A;  . SHOULDER SURGERY  1970's  . TONSILLECTOMY    . TRANSTHORACIC ECHOCARDIOGRAM  01-18-2017   dr hochrein   moderate LVH, ef 35-40%, diffuse hypokinesis/  trivial AR and MR/  mild TR  . TRANSURETHRAL RESECTION OF BLADDER TUMOR  10/07/2011   Procedure: TRANSURETHRAL RESECTION OF BLADDER TUMOR (TURBT);  Surgeon: Malka So, MD;  Location: [facility];  Service: Urology;  Laterality: N/A;  . TRANSURETHRAL RESECTION OF BLADDER TUMOR N/A 07/10/2015   Procedure: TRANSURETHRAL RESECTION OF BLADDER TUMOR (TURBT), CYSTOSCOPY ;  Surgeon: [name], MD;  Location: [facility];  Service: Urology;  Laterality: N/A;  . TRANSURETHRAL RESECTION OF PROSTATE  03/14/2012   Procedure: TRANSURETHRAL RESECTION OF THE PROSTATE WITH GYRUS INSTRUMENTS;  Surgeon: Malka So, MD;  Location: [facility];  Service: Urology;  Laterality: N/A;      Social History:     Social History   Tobacco Use  . Smoking status:  Former Smoker    Packs/day: 1.00    Years: 35.00    Pack years: 35.00    Types: Cigarettes    Start date: 04/12/1942    Last attempt to quit: 04/13/1975    Years since quitting: 43.2  . Smokeless tobacco: Never Used  Substance Use Topics  . Alcohol use: Yes    Alcohol/week: 7.0 standard drinks    Types: 7 Cans of beer per week     Lives -home alone  Mobility -mostly bedbound due to AKA     Family History :     Family History  Problem Relation Age of Onset  . Hypertension Mother   . Arthritis Mother   . Lung cancer Sister   . Deep vein thrombosis Father   . Early death Brother       Home Medications:   Prior to Admission medications   Medication Sig Start Date End Date Taking? Authorizing Provider  albuterol (PROVENTIL) (2.5 MG/3ML) 0.083% nebulizer solution Take 3 mLs (2.5 mg total) by nebulization every 6 (six) hours as needed for wheezing or shortness of breath. 04/06/18  Yes Hawks, [name] A, FNP  COMBIVENT RESPIMAT 20-100 MCG/ACT AERS respimat 1 PUFF EVERY 4 HOURS AS NEEDED FOR COUGH, WHEEZING, OR SHORTNESS OF BREATH Patient taking differently: Inhale 1 puff into the lungs every 4 (four) hours as needed for wheezing or shortness of breath.  04/06/18  Yes Dettinger, Fransisca Kaufmann, MD  donepezil (ARICEPT) 10 MG tablet TAKE ONE TABLET AT BEDTIME Patient taking differently: Take 10 mg by mouth at bedtime.  03/17/18  Yes Dettinger, Fransisca Kaufmann, MD  febuxostat (ULORIC) 40 MG tablet TAKE 1 TABLET DAILY Patient taking differently: Take 40 mg by mouth every morning.  03/03/18  Yes Dettinger, Fransisca Kaufmann, MD  FEROSUL 325 [phone]  Fe) MG tablet Take 1 tablet (325 mg total) by mouth daily with breakfast. Patient taking differently: Take 325 mg by mouth daily with breakfast.  03/17/18  Yes Dettinger, Fransisca Kaufmann, MD  fluticasone (FLONASE) 50 MCG/ACT nasal spray Place 1 spray into both nostrils daily as needed for allergies or rhinitis. 11/25/16  Yes Hawks, [name] A, FNP  folic acid (FOLVITE) 1 MG  tablet TAKE 1 TABLET DAILY Patient taking differently: Take 1 mg by mouth every morning.  03/03/18  Yes Dettinger, Fransisca Kaufmann, MD  furosemide (LASIX) 40 MG tablet Take 1 tablet (40 mg total) by mouth 2 (two) times daily. 05/02/18  Yes Dettinger, Fransisca Kaufmann, MD  gabapentin (NEURONTIN) 100 MG capsule TAKE 2 CAPSULES TWICE A DAY Patient taking differently: Take 200 mg by mouth 2 (two) times daily.  05/25/18  Yes Dettinger, Fransisca Kaufmann, MD  isosorbide dinitrate (ISORDIL) 30 MG tablet TAKE 1 TABLET ONCE A DAY Patient taking differently: Take 30 mg by mouth every morning.  03/17/18  Yes Chipper Herb, MD  metoprolol succinate (TOPROL-XL) 50 MG 24 hr tablet Take 1 tablet (50 mg total) by mouth daily. (Appt w/ PCP for blood pressure) Patient taking differently: Take 50 mg by mouth every morning. (Appt w/ PCP for blood pressure) 06/05/18  Yes Dettinger, Fransisca Kaufmann, MD  MYRBETRIQ 25 MG TB24 tablet Take 25 mg by mouth every morning.  09/20/16  Yes [provider]  nitroGLYCERIN (NITROSTAT) 0.4 MG SL tablet Place 1 tablet (0.4 mg total) under the tongue every 5 (five) minutes as needed for chest pain (up to 3 doses). 04/19/13  Yes Dunn, Dayna N, PA-C  pantoprazole (PROTONIX) 40 MG tablet TAKE 1 TABLET DAILY Patient taking differently: Take 40 mg by mouth every morning.  04/06/18  Yes Hawks, [name] A, FNP  PROAIR HFA 108 (90 Base) MCG/ACT inhaler 2 PUFFS EVERY 6 HOURS AS NEEDED FOR WHEEZING OR SHORTNESS OF BREATH Patient taking differently: Inhale 2 puffs into the lungs every 6 (six) hours as needed for wheezing or shortness of breath.  05/02/18  Yes Dettinger, Fransisca Kaufmann, MD  sertraline (ZOLOFT) 50 MG tablet TAKE 1 TABLET IN THE MORNING Patient taking differently: Take 50 mg by mouth every morning.  06/21/18  Yes Dettinger, Fransisca Kaufmann, MD  simvastatin (ZOCOR) 10 MG tablet TAKE 1 TABLET ONCE DAILY IN THE EVENING Patient taking differently: Take 10 mg by mouth at bedtime.  06/08/18  Yes Dettinger, Fransisca Kaufmann, MD  warfarin  (COUMADIN) 2.5 MG tablet Take 5 mg Friday March 29, then take 2.5 mg daily except 5 mg each Monday. Patient taking differently: Take 2.5-5 mg by mouth See admin instructions. Take 2.5 mg by mouth once daily except on Mondays and Fridays take 5 mg once daily 08/05/17  Yes Chipper Herb, MD     Allergies:     Allergies  Allergen Reactions  . Lipitor [Atorvastatin] Other (See Comments)    Unknown rxn per pt     Physical Exam:   Vitals  Blood pressure (!) 155/81, pulse 84, temperature 100.3 F (37.9 C), temperature source Oral, resp. rate 13, height 5\' 7"  (1.702 m), weight 72 kg, SpO2 99 %.    General: Elderly male lying in bed in no acute distress, fatigue HEENT: Pupils reactive bilaterally, EOMI, no pallor, no icterus, moist mucosa, supple neck, no oral thrush, no pharyngeal congestion, supple neck, no cervical lymphadenopathy Chest: Scattered wheezing bilaterally, no crackles CVS: Normal S1-S2, no murmurs rub or gallop GI: Soft, nondistended, nontender,  bowel sounds present Musculoskeletal: Warm, no edema, left AKA CNS: Alert and oriented, nonfocal   Data Review:    CBC Recent Labs  Lab 06/22/18 1803  WBC 6.4  HGB 14.3  HCT 44.9  PLT 168  MCV 86.8  MCH 27.7  MCHC 31.8  RDW 14.7  LYMPHSABS 0.5*  MONOABS 0.3  EOSABS 0.0  BASOSABS 0.0   ------------------------------------------------------------------------------------------------------------------  Chemistries  Recent Labs  Lab 06/22/18 1803  NA 139  K 4.3  CL 107  CO2 24  GLUCOSE 95  BUN 31*  CREATININE 1.46*  CALCIUM 9.3  AST 20  ALT 13  ALKPHOS 191*  BILITOT 0.5   ------------------------------------------------------------------------------------------------------------------ estimated creatinine clearance is 28.9 mL/min (A) (by C-G formula based on SCr of 1.46 mg/dL (H)). ------------------------------------------------------------------------------------------------------------------ No  results for input(s): TSH, T4TOTAL, T3FREE, THYROIDAB in the last 72 hours.  Invalid input(s): FREET3  Coagulation profile Recent Labs  Lab 06/22/18 1803  INR 1.7*   ------------------------------------------------------------------------------------------------------------------- No results for input(s): DDIMER in the last 72 hours. -------------------------------------------------------------------------------------------------------------------  Cardiac Enzymes Recent Labs  Lab 06/22/18 1815  TROPONINI 0.03*   ------------------------------------------------------------------------------------------------------------------    Component Value Date/Time   BNP 1,477.0 (H) 06/22/2018 1803     ---------------------------------------------------------------------------------------------------------------  Urinalysis    Component Value Date/Time   COLORURINE YELLOW 06/22/2018 2112   APPEARANCEUR CLOUDY (A) 06/22/2018 2112   APPEARANCEUR Cloudy (A) 03/31/2017 1413   LABSPEC 1.009 06/22/2018 2112   PHURINE 5.0 06/22/2018 2112   GLUCOSEU NEGATIVE 06/22/2018 2112   HGBUR MODERATE (A) 06/22/2018 2112   BILIRUBINUR NEGATIVE 06/22/2018 2112   BILIRUBINUR Negative 03/31/2017 [place] 06/22/2018 2112   PROTEINUR NEGATIVE 06/22/2018 2112   UROBILINOGEN negative 05/19/2015 1203   UROBILINOGEN 1.0 10/22/2014 1804   NITRITE NEGATIVE 06/22/2018 2112   LEUKOCYTESUR SMALL (A) 06/22/2018 2112    ----------------------------------------------------------------------------------------------------------------   Imaging Results:    Dg Chest 2 View  Result Date: 06/22/2018 CLINICAL DATA:  Shortness of breath with cough and fever. EXAM: CHEST - 2 VIEW COMPARISON:  03/24/2018 FINDINGS: The cardio pericardial silhouette is enlarged. There is pulmonary vascular congestion without overt pulmonary edema. Interstitial markings are diffusely coarsened with chronic features.  Stable right paratracheal opacity in the medial right lung compatible with ectasias of arch vessel anatomy as seen on CT scan from 04/11/2013. Stable bilateral hilar fullness. The visualized bony structures of the thorax are intact. Telemetry leads overlie the chest. IMPRESSION: Stable exam. Cardiomegaly with vascular congestion and chronic interstitial lung disease. Electronically Signed   By: Misty Stanley M.D.   On: 06/22/2018 18:52    My personal review of EKG: Pending   Assessment & Plan:    Principal Problem:   SIRS (systemic inflammatory response syndrome) (HCC) Suspect this is likely combination of UTI (recent urological procedure) and acute viral bronchitis versus COPD exacerbation. Admit to MedSurg.  Blood culture and urine culture ordered in the ED. Empiric IV Rocephin for possible UTI/acute bronchitis. Placed on empiric IV Solu-Medrol 40 mg every 12 hours, scheduled DuoNeb and PRN albuterol neb.  Supportive care with Mucinex and Tylenol.  Continue supplemental oxygen.Continue home inhaler. Check respiratory viral panel.  Maintain droplet precaution.  Active Problems:   Benign essential HTN Stable.  Resume home meds.    CAD- non obstructive disease by cath 3/14   Chronic combined systolic and diastolic heart failure (HCC) Appears dehydrated.  Will reduce Lasix dose to once a day and placed on gentle hydration and monitor in a.m.continue BB, imdur and statin.  Strict I's/O and daily weight.     History of pulmonary embolism (2014) Paroxysmal A. fib On Coumadin.  INR subtherapeutic at 1.7.  Dosing per pharmacy.    BPH (benign prostatic hyperplasia)    PAD (peripheral artery disease) (HCC) Status post left AKA    Essential hypertension Stable. Continue home meds    Acute on Chronic kidney disease (CKD) stage G3a/A1, moderately decreased glomerular filtration rate (GFR) between 45-59 mL/min/1.73 square meter and albuminuria creatinine ratio less than 30 mg/g (HCC) Renal  function mildly worsened from baseline.  Will reduce Lasix dose and monitor with gentle hydration.  Bladder cancer Recent biopsy shows papillary urothelial carcinoma, low-grade.  Follow with urologist as outpatient.      DVT Prophylaxis coumadin  AM Labs Ordered, also please review Full Orders  Family Communication: Admission, patients condition and plan of care including tests being ordered have been discussed with the patient and his nephew at bedside.   Code Status DNR  Likely DC to home once improved  Condition: Fair  Consults called: None  Admission status: Inpatient Patient presenting with SIRS with acute hypoxic respiratory failure, COPD with acute exacerbation and acute UTI.  He needs to be monitored in an inpatient setting with oxygen therapy, IV antibiotic, IV fluids, IV steroids and scheduled nebulizers.  For this he needs to be monitored for at least >2 midnight in an inpatient setting.  Time spent in minutes : 70   Velera Lansdale M.D on 06/22/2018 at 10:10 PM  Between 7am to 7pm - Pager - [phone]. After 7pm go to [email or web address] - password [facility]  [facility] - Office  [phone]

## 2018-06-22 NOTE — ED Notes (Signed)
Date and time results received: 06/22/18 19:58 (use smartphrase ".now" to insert current time)  Test: troponin Critical Value: 0.03  Name of Provider Notified: Delo MD  Orders Received? Or Actions Taken?:

## 2018-06-23 LAB — BASIC METABOLIC PANEL
Anion gap: 9 (ref 5–15)
BUN: 35 mg/dL — ABNORMAL HIGH (ref 8–23)
CO2: 27 mmol/L (ref 22–32)
Calcium: 9.1 mg/dL (ref 8.9–10.3)
Chloride: 107 mmol/L (ref 98–111)
Creatinine, Ser: 1.6 mg/dL — ABNORMAL HIGH (ref 0.61–1.24)
GFR calc Af Amer: 42 mL/min — ABNORMAL LOW (ref >60)
GFR calc non Af Amer: 36 mL/min — ABNORMAL LOW (ref >60)
Glucose, Bld: 138 mg/dL — ABNORMAL HIGH (ref 70–99)
Potassium: 4.1 mmol/L (ref 3.5–5.1)
Sodium: 143 mmol/L (ref 135–145)

## 2018-06-23 LAB — PROTIME-INR
INR: 1.9 — ABNORMAL HIGH (ref 0.8–1.2)
Prothrombin Time: 21.2 seconds — ABNORMAL HIGH (ref 11.4–15.2)

## 2018-06-23 LAB — CBC
HCT: 45.6 % (ref 39.0–52.0)
Hemoglobin: 14 g/dL (ref 13.0–17.0)
MCH: 27.3 pg (ref 26.0–34.0)
MCHC: 30.7 g/dL (ref 30.0–36.0)
MCV: 89.1 fL (ref 80.0–100.0)
Platelets: 178 10*3/uL (ref 150–400)
RBC: 5.12 MIL/uL (ref 4.22–5.81)
RDW: 14.6 % (ref 11.5–15.5)
WBC: 8.6 10*3/uL (ref 4.0–10.5)
nRBC: 0 % (ref 0.0–0.2)

## 2018-06-23 LAB — GLUCOSE, CAPILLARY: Glucose-Capillary: 137 mg/dL — ABNORMAL HIGH (ref 70–99)

## 2018-06-23 MED ORDER — WARFARIN SODIUM 1 MG PO TABS
1.0000 mg | ORAL_TABLET | Freq: Once | ORAL | Status: AC
  Administered 2018-06-23: 1 mg via ORAL
  Filled 2018-06-23: qty 1

## 2018-06-23 MED ORDER — ALBUTEROL SULFATE (2.5 MG/3ML) 0.083% IN NEBU
2.5000 mg | INHALATION_SOLUTION | RESPIRATORY_TRACT | Status: DC | PRN
  Administered 2018-06-23: 2.5 mg via RESPIRATORY_TRACT
  Filled 2018-06-23: qty 3

## 2018-06-23 MED ORDER — SODIUM CHLORIDE 0.9 % IV SOLN
1.0000 g | INTRAVENOUS | Status: DC
  Administered 2018-06-23: 1 g via INTRAVENOUS
  Filled 2018-06-23: qty 10

## 2018-06-23 MED ORDER — WARFARIN SODIUM 5 MG PO TABS
5.0000 mg | ORAL_TABLET | Freq: Once | ORAL | Status: AC
  Administered 2018-06-23: 5 mg via ORAL
  Filled 2018-06-23: qty 1

## 2018-06-23 MED ORDER — WARFARIN SODIUM 1 MG PO TABS: 1.5000 mg | ORAL_TABLET | Freq: Once | ORAL | Status: DC

## 2018-06-23 MED ORDER — IPRATROPIUM BROMIDE 0.02 % IN SOLN
RESPIRATORY_TRACT | Status: AC
  Administered 2018-06-23: 0.5 mg
  Filled 2018-06-23: qty 2.5

## 2018-06-23 MED ORDER — IPRATROPIUM-ALBUTEROL 0.5-2.5 (3) MG/3ML IN SOLN
3.0000 mL | Freq: Four times a day (QID) | RESPIRATORY_TRACT | Status: DC
  Administered 2018-06-24 (×3): 3 mL via RESPIRATORY_TRACT
  Filled 2018-06-23 (×3): qty 3

## 2018-06-23 NOTE — Progress Notes (Signed)
Patient has upper airway wheezes which are transmitted to lung fields. Coughing up some clear to light yellow secretion. Suspect bronchitis have change nebs to q6 while awake duoneb from q6 albuterol -- have mad albuterol prn.

## 2018-06-23 NOTE — Progress Notes (Signed)
ANTICOAGULATION CONSULT NOTE - Initial Consult  Pharmacy Consult for Coumadin Indication: VTE treatment  Allergies  Allergen Reactions  . Lipitor [Atorvastatin] Other (See Comments)    Unknown rxn per pt    Patient Measurements: Height: 5\' 7"  (170.2 cm) Weight: 160 lb 4.8 oz (72.7 kg) IBW/kg (Calculated) : 66.1  Vital Signs: Temp: 98.2 F (36.8 C) (03/12 2352) Temp Source: Oral (03/12 2352) BP: 151/76 (03/13 0523) Pulse Rate: 54 (03/13 0523)  Labs: Recent Labs    06/22/18 1803 06/22/18 1815 06/23/18 0433 06/23/18 0632  HGB 14.3  --  14.0  --   HCT 44.9  --  45.6  --   PLT 168  --  178  --   LABPROT 19.4*  --   --  21.2*  INR 1.7*  --   --  1.9*  CREATININE 1.46*  --  1.60*  --   TROPONINI  --  0.03*  --   --     Estimated Creatinine Clearance: 26.4 mL/min (A) (by C-G formula based on SCr of 1.6 mg/dL (H)).   Medical History: Past Medical History:  Diagnosis Date  . Arthritis    "all over"  . Atrial fibrillation ([place])   . Benign localized prostatic hyperplasia with lower urinary tract symptoms (LUTS)   . Bradycardia 11/28/2015   Severe-HR in the 20s to 30s following intubation for urological procedure.  Marland Kitchen CAD- non obstructive disease by cath 3/14 cardiologist-  dr hochrein  . Chronic lower back pain   . COPD (chronic obstructive pulmonary disease) ([place])   . DDD (degenerative disc disease)   . Depression   . Dyspnea    w/ exertion and lying down (raise head)  . ETOH abuse   . GERD (gastroesophageal reflux disease)   . Glaucoma   . Glaucoma, both eyes   . History of bladder cancer urologist-- dr Jeffie Pollock   first dx 2012--  s/p TURBT's  . History of DVT of lower extremity 03/2013   bilateral   . History of pulmonary embolus (PE) 04/2013  . HTN (hypertension)   . Hyperlipidemia   . Hypertension   . LBBB (left bundle branch block)    chronic  . Non-ischemic cardiomyopathy ([place])    last echo 01-18-2017, ef 35-40%  . NSVT (nonsustained ventricular  tachycardia) ([place])    a. NSVT 06/2012; NSVT also seen during 03/2013 adm. b. Med rx. Not candidate for ICD given adv age.  Marland Kitchen PAD (peripheral artery disease) ([place])   . 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 PVCs (premature ventricular contractions)   . PVD (peripheral vascular disease) (HCC)    Right ABI .75, Left .78 (2006)  . Syncope 06/2012   a. Felt to be postural syncope related to diuretics 06/2012.  Marland Kitchen Systolic and diastolic CHF, chronic (HCC) cardiologsit-  dr hochrein   a. NICM - patent cors 06/2012, EF 40% at that time. b. 03/2013 eval: EF 20-25%.  . Vision loss, left eye    due to glaucoma  . Wears glasses     Medications:  Medications Prior to Admission  Medication Sig Dispense Refill Last Dose  . albuterol (PROVENTIL) (2.5 MG/3ML) 0.083% nebulizer solution Take 3 mLs (2.5 mg total) by nebulization every 6 (six) hours as needed for wheezing or shortness of breath. 150 mL 1 UNKNOWN  . COMBIVENT RESPIMAT 20-100 MCG/ACT AERS respimat 1 PUFF EVERY 4 HOURS AS NEEDED FOR COUGH,  WHEEZING, OR SHORTNESS OF BREATH (Patient taking differently: Inhale 1 puff into the lungs every 4 (four) hours as needed for wheezing or shortness of breath. ) 4 g 4 UNKNOWN  . donepezil (ARICEPT) 10 MG tablet TAKE ONE TABLET AT BEDTIME (Patient taking differently: Take 10 mg by mouth at bedtime. ) 30 tablet 4 06/21/2018 at Unknown time  . febuxostat (ULORIC) 40 MG tablet TAKE 1 TABLET DAILY (Patient taking differently: Take 40 mg by mouth every morning. ) 30 tablet 5 06/22/2018 at Unknown time  . FEROSUL 325 (65 Fe) MG tablet Take 1 tablet (325 mg total) by mouth daily with breakfast. (Patient taking differently: Take 325 mg by mouth daily with breakfast. ) 30 tablet 4 06/22/2018 at Unknown time  . fluticasone (FLONASE) 50 MCG/ACT nasal spray Place 1 spray into both nostrils daily as needed for allergies or rhinitis. 16 g 2  UNKNOWN  . folic acid (FOLVITE) 1 MG tablet TAKE 1 TABLET DAILY (Patient taking differently: Take 1 mg by mouth every morning. ) 30 tablet 5 06/22/2018 at Unknown time  . furosemide (LASIX) 40 MG tablet Take 1 tablet (40 mg total) by mouth 2 (two) times daily. 60 tablet 4 06/22/2018 at Unknown time  . gabapentin (NEURONTIN) 100 MG capsule TAKE 2 CAPSULES TWICE A DAY (Patient taking differently: Take 200 mg by mouth 2 (two) times daily. ) 60 capsule 2 06/22/2018 at Unknown time  . isosorbide dinitrate (ISORDIL) 30 MG tablet TAKE 1 TABLET ONCE A DAY (Patient taking differently: Take 30 mg by mouth every morning. ) 30 tablet 11 06/22/2018 at Unknown time  . metoprolol succinate (TOPROL-XL) 50 MG 24 hr tablet Take 1 tablet (50 mg total) by mouth daily. (Appt w/ PCP for blood pressure) (Patient taking differently: Take 50 mg by mouth every morning. (Appt w/ PCP for blood pressure)) 30 tablet 0 06/22/2018 at Unknown time  . MYRBETRIQ 25 MG TB24 tablet Take 25 mg by mouth every morning.    06/22/2018 at Unknown time  . nitroGLYCERIN (NITROSTAT) 0.4 MG SL tablet Place 1 tablet (0.4 mg total) under the tongue every 5 (five) minutes as needed for chest pain (up to 3 doses). 25 tablet 3 UNKNOWN  . pantoprazole (PROTONIX) 40 MG tablet TAKE 1 TABLET DAILY (Patient taking differently: Take 40 mg by mouth every morning. ) 90 tablet 0 06/22/2018 at Unknown time  . PROAIR HFA 108 (90 Base) MCG/ACT inhaler 2 PUFFS EVERY 6 HOURS AS NEEDED FOR WHEEZING OR SHORTNESS OF BREATH (Patient taking differently: Inhale 2 puffs into the lungs every 6 (six) hours as needed for wheezing or shortness of breath. ) 8.5 g 4 UNKNOWN  . sertraline (ZOLOFT) 50 MG tablet TAKE 1 TABLET IN THE MORNING (Patient taking differently: Take 50 mg by mouth every morning. ) 30 tablet 0 06/22/2018 at Unknown time  . simvastatin (ZOCOR) 10 MG tablet TAKE 1 TABLET ONCE DAILY IN THE EVENING (Patient taking differently: Take 10 mg by mouth at bedtime. ) 90 tablet 0  06/21/2018 at Unknown time  . warfarin (COUMADIN) 2.5 MG tablet Take 5 mg Friday March 29, then take 2.5 mg daily except 5 mg each Monday. (Patient taking differently: Take 2.5-5 mg by mouth See admin instructions. Take 2.5 mg by mouth once daily except on Mondays and Fridays take 5 mg once daily) 10 tablet 0 06/22/2018 at [facility]    Assessment: Pt admitted with SOB. He has a history of afib and VTE (both DVT and PE). He is on  chronic coumadin tx and pharmacy asked to dose. Coumadin 1mg  dose given this AM, INR is subtherapeutic.  Home dose: 5mg  on Monday and Friday; 2.5mg  all other days  Goal of Therapy:  INR 2-3 Monitor platelets by anticoagulation protocol: Yes   Plan:  Coumadin 5mg  x 1 PT-INR daily Monitor for S/S of bleeding  Isac Sarna, BS Vena [place], BCPS Clinical Pharmacist Pager [phone] 06/23/2018,8:10 AM

## 2018-06-23 NOTE — TOC Initial Note (Signed)
Transition of Care [facility]) - Initial/Assessment Note    Patient Details  Name: Scott [name] MRN: [id number] Date of Birth: 1921/10/16  Transition of [facility]) CM/SW Contact:    Nayelis Bonito, Chauncey Reading, RN Phone Number: 06/23/2018, 11:35 AM  Clinical Narrative:       COPD, UTI.  Patient is from home. He is alert and oriented, answering all question. Son at bedside.  Has caregivers M-F 5 hours a day with [employer]. Selinda Eon out of pocket for an extra 20 hours during the week and 5 hours on Saturday and Sunday. Family lives close by. He has a leg prosthesis, it is not fitting properly currently, he is working on getting that fixed. Has a WC.  Denies need for other DME. Acutely on oxygen, weaning in progress.  Discussed need for home health services. Patient declines need at this time.    Expected Discharge Plan: [place] Barriers to Discharge: No Barriers Identified   Patient Goals and CMS Choice Patient states their goals for this hospitalization and ongoing recovery are:: get back home       Expected Discharge Plan and Services Expected Discharge Plan: [place] Discharge Planning Services: CM Consult   Living arrangements for the past 2 months: Single Family Home, [place]                          Prior Living Arrangements/Services Living arrangements for the past 2 months: [place], [place] Lives with:: Self(has care givers with [employer] and also private pays for more care) Patient language and need for interpreter reviewed:: No Do you feel safe going back to the place where you live?: Yes      Need for Family Participation in Patient Care: Yes (Comment) Care giver support system in place?: Yes (comment) Current home services: Other (comment), 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[employer]) Criminal Activity/Legal Involvement Pertinent to Current Situation/Hospitalization: No - Comment as needed  Activities of Daily Living Home  Assistive Devices/Equipment: Cane (specify quad or straight), Walker (specify type) ADL Screening (condition at time of admission) Patient's cognitive ability adequate to safely complete daily activities?: Yes Is the patient deaf or have difficulty hearing?: No Does the patient have difficulty seeing, even when wearing glasses/contacts?: No Does the patient have difficulty concentrating, remembering, or making decisions?: No Patient able to express need for assistance with ADLs?: No Does the patient have difficulty dressing or bathing?: No Independently performs ADLs?: Yes (appropriate for developmental age) Communication: Independent Dressing (OT): Independent Grooming: Needs assistance Is this a change from baseline?: Pre-admission baseline Feeding: Independent Bathing: Needs assistance Is this a change from baseline?: Pre-admission baseline Toileting: Needs assistance Is this a change from baseline?: Pre-admission baseline In/Out Bed: Needs assistance Is this a change from baseline?: Pre-admission baseline Walks in Home: Needs assistance Is this a change from baseline?: Pre-admission baseline Does the patient have difficulty walking or climbing stairs?: Yes Weakness of Legs: None Weakness of Arms/Hands: None       Emotional Assessment Appearance:: Appears stated age Attitude/Demeanor/Rapport: Engaged Affect (typically observed): Calm Orientation: : Oriented to Self, Oriented to Place, Oriented to  Time, Oriented to Situation      Admission diagnosis:  Acute febrile illness [R50.9] Urinary tract infection without hematuria, site unspecified [N39.0] Patient Active Problem List   Diagnosis Date Noted  . SIRS (systemic inflammatory response syndrome) ([place]) 06/22/2018  . Paroxysmal atrial fibrillation ([place]) 01/18/2017  . PVC (pulmonary venous congestion)  01/18/2017  . Thoracic aortic atherosclerosis ([place]) 11/26/2016  . Thrombocytopenia ([place]) 08/04/2016  . Chronic kidney  disease (CKD) stage G3a/A1, moderately decreased glomerular filtration rate (GFR) between 45-59 mL/min/1.73 square meter and albuminuria creatinine ratio less than 30 mg/g (HCC) 08/04/2016  . Essential hypertension 11/29/2015  . Bradycardia 11/28/2015  . Malignant neoplasm of lateral wall of urinary bladder ([place]) 07/10/2015  . Status post above knee amputation of left lower extremity 10/18/2014  . Tachyarrhythmia 10/05/2014  . Aspiration pneumonia ([place]) 10/05/2014  . Lower urinary tract infectious disease 10/03/2014  . Sepsis due to other etiology ([place]) 10/03/2014  . Gangrene of toe ([place]) 10/03/2014  . Ischemic ulcer of left foot ([place]) 10/03/2014  . Atherosclerotic PVD with ulceration ([place]) 10/03/2014  . Warfarin-induced coagulopathy ([place]) 10/03/2014  . Atrial fibrillation with RVR ([place]) 10/03/2014  . Acute kidney injury ([place]) 10/03/2014  . Hyperkalemia 10/03/2014  . Cellulitis of left leg 10/03/2014  . PAD (peripheral artery disease) ([place]) 09/24/2014  . NSTEMI (non-ST elevated myocardial infarction) ([place]) 10/09/2013  . High risk medication use 05/17/2013  . BPH (benign prostatic hyperplasia) 05/17/2013  . Vitamin D deficiency 05/17/2013  . History of pulmonary embolism (2014) 04/17/2013  . NSVT (nonsustained ventricular tachycardia)- not an ICD candidate 04/17/2013  . NICM (nonischemic cardiomyopathy)- EF 20-25% by Echo 04/12/13 04/17/2013  . Chronic combined systolic and diastolic heart failure ([place]) 03/15/2013  . DVT of lower extremity, bilateral ([place]) 03/15/2013  . Fatigue 11/02/2012  . Constipation 11/02/2012  . Alcohol abuse 08/27/2012  . Syncope 06/21/2012  . Bladder cancer ([place]) 03/15/2012  . Benign localized hyperplasia of prostate with urinary obstruction 03/15/2012  . Benign essential HTN   . Hyperlipidemia   . PVCs (premature ventricular contractions)   . CAD- non obstructive disease by cath 3/14    PCP:  Dettinger, Fransisca Kaufmann, MD Pharmacy:   [facility], [address] [place] [address] Phone: [phone] Fax: [phone]     Social Determinants of Health (SDOH) Interventions    Readmission Risk Interventions 30 Day Unplanned Readmission Risk Score     ED to Hosp-Admission (Current) from 06/22/2018 in [place]  30 Day Unplanned Readmission Risk Score (%)  26 Filed at 06/23/2018 0801     This score is the patient's risk of an unplanned readmission within 30 days of being discharged (0 -100%). The score is based on dignosis, age, lab data, medications, orders, and past utilization.   Low:  0-14.9   Medium: 15-21.9   High: 22-29.9   Extreme: 30 and above

## 2018-06-23 NOTE — Progress Notes (Signed)
PROGRESS NOTE    Scott [name]  TDD:[id number] DOB: Sep 05, 1921 DOA: 06/22/2018 PCP: Dettinger, Fransisca Kaufmann, MD   Brief Narrative:  Per HPI: Scott [name]  is a 85 y.o. male, with history of chronic combined systolic and diastolic CHF (nonischemic cardiomyopathy with EF of 35 and 40% as per last echo in 2018), COPD (not on home O2), history of DVT and PE, paroxysmal A. fib on chronic Coumadin, PVD with left AKA, CAD,, GERD, glaucoma, hypertension, hyperlipidemia, BPH, history of bladder cancer with recent cystoscopy with biopsy and fulguration of bladder neck tumor on 2/28 was brought from home by his son and his nephew with fever and cough since last night. Patient lives home alone and has caregiver and family close by.  Since last night he was complaining of cough with congestion and fever.  His nephew checks his temperature this morning and was 102 F.  Patient was also found to be slightly confused.  He denies any headache, blurred vision, dizziness, nausea, vomiting, chest pain, palpitations, orthopnea, PND, abdominal pain, dysuria, diarrhea, weakness or numbness in his extremities.  No history of sick contact or recent travel.  His son has been in and out of the state but has been in good health.  Denies any change in his medication recently. The fire department put patient on NRB and patient was satting at 94% when EMS arrived which dropped to 86% on room air.  He was given albuterol and IV Solu-Medrol and brought to the ED.  Patient was admitted with Sirs likely secondary to UTI along with possible viral bronchitis and associated COPD exacerbation.  Assessment & Plan:   Principal Problem:   SIRS (systemic inflammatory response syndrome) (HCC) Active Problems:   Benign essential HTN   CAD- non obstructive disease by cath 3/14   Chronic combined systolic and diastolic heart failure ([place])   History of pulmonary embolism (2014)   BPH (benign prostatic hyperplasia)   PAD (peripheral artery  disease) ([place])   Lower urinary tract infectious disease   Atherosclerotic PVD with ulceration (HCC)   Acute kidney injury ([place])   Essential hypertension   Chronic kidney disease (CKD) stage G3a/A1, moderately decreased glomerular filtration rate (GFR) between 45-59 mL/min/1.73 square meter and albuminuria creatinine ratio less than 30 mg/g (HCC)   Paroxysmal atrial fibrillation ([place])   1. Sirs with combination UTI and viral bronchitis.  Continue on empiric Rocephin and follow blood and urine cultures which are negative and/were pending.  Continue on supportive treatment for bronchitis as well. 2. Mild COPD exacerbation secondary to above.  Maintain on IV Solu-Medrol and wean oxygen as tolerated.  Continue duo nebs.  Supportive care with Mucinex and Tylenol.  Respiratory viral panel pending and patient currently on droplet precautions. 3. Benign essential hypertension.  Continue home medications.  Currently stable. 4. CAD with nonobstructive disease by cath on 3/14.  Patient also has chronic combined systolic and diastolic heart failure.  Fluid balance is positive almost 1 L at this time.  Will hold further IV fluid currently and also hold Lasix as well until renal function improves slightly. 5. Paroxysmal atrial fibrillation/history of PE.  Maintained on Coumadin with subtherapeutic INR currently noted.  Appreciate dosing by pharmacy. 6. PAD.  Status post left AKA. 7. BPH.  Monitor for any urinary obstructive symptoms. 8. Mild renal insufficiency on CKD stage III.  Hold current Lasix dose and monitor repeat BMP. 9. Bladder cancer.  Follow-up with urology outpatient.   DVT prophylaxis: Coumadin, being followed by  pharmacy Code Status: DNR Family Communication: None at bedside Disposition Plan: Continue antibiotic treatment of UTI as well as COPD treatment with anticipated discharge in the next 24 to 48 hours as he is improving, but not quite back to baseline.  Will wean oxygen.    Consultants:   None  Procedures:   None  Antimicrobials:   Rocephin 3/12->   Subjective: Patient seen and evaluated today with no new acute complaints or concerns. No acute concerns or events noted overnight.  He states that he started to breathe some better and has much less cough, but remains on 3 L nasal cannula.  No significant dysuria noted.  Objective: Vitals:   06/23/18 0246 06/23/18 0523 06/23/18 0630 06/23/18 0804  BP:  (!) 151/76    Pulse:  (!) 54    Resp:  20    Temp:      TempSrc:      SpO2: 96% 99%  98%  Weight:   72.7 kg   Height:        Intake/Output Summary (Last 24 hours) at 06/23/2018 0949 Last data filed at 06/23/2018 0900 Gross per 24 hour  Intake 1240.57 ml  Output 250 ml  Net 990.57 ml   Filed Weights   06/22/18 1759 06/23/18 0630  Weight: 72 kg 72.7 kg    Examination:  General exam: Appears calm and comfortable  Respiratory system: Clear to auscultation. Respiratory effort normal.  Currently on 3 L nasal cannula with minimal breath sounds at bases. Cardiovascular system: S1 & S2 heard, RRR. No JVD, murmurs, rubs, gallops or clicks. No pedal edema. Gastrointestinal system: Abdomen is nondistended, soft and nontender. No organomegaly or masses felt. Normal bowel sounds heard. Central nervous system: Alert and oriented. No focal neurological deficits. Extremities: Symmetric 5 x 5 power. Skin: No rashes, lesions or ulcers Psychiatry: Judgement and insight appear normal. Mood & affect appropriate.     Data Reviewed: I have personally reviewed following labs and imaging studies  CBC: Recent Labs  Lab 06/22/18 1803 06/23/18 0433  WBC 6.4 8.6  NEUTROABS 5.5  --   HGB 14.3 14.0  HCT 44.9 45.6  MCV 86.8 89.1  PLT 168 350   Basic Metabolic Panel: Recent Labs  Lab 06/22/18 1803 06/23/18 0433  NA 139 143  K 4.3 4.1  CL 107 107  CO2 24 27  GLUCOSE 95 138*  BUN 31* 35*  CREATININE 1.46* 1.60*  CALCIUM 9.3 9.1   GFR: Estimated  Creatinine Clearance: 26.4 mL/min (A) (by C-G formula based on SCr of 1.6 mg/dL (H)). Liver Function Tests: Recent Labs  Lab 06/22/18 1803  AST 20  ALT 13  ALKPHOS 191*  BILITOT 0.5  PROT 7.1  ALBUMIN 3.9   No results for input(s): LIPASE, AMYLASE in the last 168 hours. No results for input(s): AMMONIA in the last 168 hours. Coagulation Profile: Recent Labs  Lab 06/22/18 1803 06/23/18 0632  INR 1.7* 1.9*   Cardiac Enzymes: Recent Labs  Lab 06/22/18 1815  TROPONINI 0.03*   BNP (last 3 results) No results for input(s): PROBNP in the last 8760 hours. HbA1C: No results for input(s): HGBA1C in the last 72 hours. CBG: Recent Labs  Lab 06/23/18 0729  GLUCAP 137*   Lipid Profile: No results for input(s): CHOL, HDL, LDLCALC, TRIG, CHOLHDL, LDLDIRECT in the last 72 hours. Thyroid Function Tests: No results for input(s): TSH, T4TOTAL, FREET4, T3FREE, THYROIDAB in the last 72 hours. Anemia Panel: No results for input(s): VITAMINB12, FOLATE, FERRITIN, TIBC,  IRON, RETICCTPCT in the last 72 hours. Sepsis Labs: Recent Labs  Lab 06/22/18 1803 06/22/18 2039  LATICACIDVEN 1.1 0.9    Recent Results (from the past 240 hour(s))  Culture, blood (Routine x 2)     Status: None (Preliminary result)   Collection Time: 06/22/18  6:03 PM  Result Value Ref Range Status   Specimen Description BLOOD RIGHT FOREARM  Final   Special Requests   Final    BOTTLES DRAWN AEROBIC AND ANAEROBIC Blood Culture adequate volume   Culture   Final    NO GROWTH < 24 HOURS Performed at [facility], [address] [place], [place] [address]    Report Status PENDING  Incomplete  Culture, blood (Routine x 2)     Status: None (Preliminary result)   Collection Time: 06/22/18  6:54 PM  Result Value Ref Range Status   Specimen Description BLOOD LEFT ANTECUBITAL  Final   Special Requests   Final    BOTTLES DRAWN AEROBIC AND ANAEROBIC Blood Culture adequate volume   Culture   Final    NO GROWTH < 24  HOURS Performed at [facility], [address] [place], [place] [address]    Report Status PENDING  Incomplete         Radiology Studies: Dg Chest 2 View  Result Date: 06/22/2018 CLINICAL DATA:  Shortness of breath with cough and fever. EXAM: CHEST - 2 VIEW COMPARISON:  03/24/2018 FINDINGS: The cardio pericardial silhouette is enlarged. There is pulmonary vascular congestion without overt pulmonary edema. Interstitial markings are diffusely coarsened with chronic features. Stable right paratracheal opacity in the medial right lung compatible with ectasias of arch vessel anatomy as seen on CT scan from 04/11/2013. Stable bilateral hilar fullness. The visualized bony structures of the thorax are intact. Telemetry leads overlie the chest. IMPRESSION: Stable exam. Cardiomegaly with vascular congestion and chronic interstitial lung disease. Electronically Signed   By: Misty Stanley M.D.   On: 06/22/2018 18:52        Scheduled Meds: . albuterol  2.5 mg Nebulization Q6H  . dextromethorphan-guaiFENesin  1 tablet Oral BID  . donepezil  10 mg Oral QHS  . febuxostat  40 mg Oral Daily  . ferrous sulfate  325 mg Oral Q breakfast  . folic acid  1 mg Oral Daily  . furosemide  40 mg Oral Daily  . gabapentin  200 mg Oral BID  . isosorbide dinitrate  30 mg Oral Daily  . methylPREDNISolone (SOLU-MEDROL) injection  40 mg Intravenous Q12H  . metoprolol succinate  50 mg Oral Daily  . mirabegron ER  25 mg Oral q morning - 10a  . pantoprazole  40 mg Oral Daily  . sertraline  50 mg Oral q morning - 10a  . simvastatin  10 mg Oral QHS  . warfarin  5 mg Oral Once  . Warfarin - Pharmacist Dosing Inpatient   Does not apply q1800   Continuous Infusions: . sodium chloride Stopped (06/22/18 2249)  . sodium chloride 75 mL/hr at 06/23/18 0051     LOS: 1 day    Time spent: 30 minutes    Moet Mikulski Darleen Crocker, DO [facility] Pager [phone]  If 7PM-7AM, please contact night-coverage  [email or web address] Password [facility] 06/23/2018, 9:49 AM

## 2018-06-24 LAB — BASIC METABOLIC PANEL
Anion gap: 8 (ref 5–15)
BUN: 42 mg/dL — ABNORMAL HIGH (ref 8–23)
CO2: 26 mmol/L (ref 22–32)
Calcium: 9.5 mg/dL (ref 8.9–10.3)
Chloride: 110 mmol/L (ref 98–111)
Creatinine, Ser: 1.41 mg/dL — ABNORMAL HIGH (ref 0.61–1.24)
GFR calc Af Amer: 49 mL/min — ABNORMAL LOW (ref >60)
GFR calc non Af Amer: 42 mL/min — ABNORMAL LOW (ref >60)
Glucose, Bld: 136 mg/dL — ABNORMAL HIGH (ref 70–99)
Potassium: 4.3 mmol/L (ref 3.5–5.1)
Sodium: 144 mmol/L (ref 135–145)

## 2018-06-24 LAB — CBC
HCT: 47.2 % (ref 39.0–52.0)
Hemoglobin: 14.3 g/dL (ref 13.0–17.0)
MCH: 27.2 pg (ref 26.0–34.0)
MCHC: 30.3 g/dL (ref 30.0–36.0)
MCV: 89.9 fL (ref 80.0–100.0)
Platelets: 172 10*3/uL (ref 150–400)
RBC: 5.25 MIL/uL (ref 4.22–5.81)
RDW: 15 % (ref 11.5–15.5)
WBC: 11.1 10*3/uL — ABNORMAL HIGH (ref 4.0–10.5)
nRBC: 0 % (ref 0.0–0.2)

## 2018-06-24 LAB — URINE CULTURE: Culture: NO GROWTH

## 2018-06-24 LAB — GLUCOSE, CAPILLARY: Glucose-Capillary: 118 mg/dL — ABNORMAL HIGH (ref 70–99)

## 2018-06-24 LAB — PROTIME-INR
INR: 2.5 — ABNORMAL HIGH (ref 0.8–1.2)
Prothrombin Time: 26.3 seconds — ABNORMAL HIGH (ref 11.4–15.2)

## 2018-06-24 MED ORDER — CEFDINIR 300 MG PO CAPS: 300.0000 mg | ORAL_CAPSULE | Freq: Every day | ORAL | 0 refills | Status: AC

## 2018-06-24 MED ORDER — PREDNISONE 20 MG PO TABS: 40.0000 mg | ORAL_TABLET | Freq: Every day | ORAL | 0 refills | Status: AC

## 2018-06-24 MED ORDER — WARFARIN SODIUM 2.5 MG PO TABS: 2.5000 mg | ORAL_TABLET | Freq: Once | ORAL | Status: DC

## 2018-06-24 MED ORDER — CEFDINIR 300 MG PO CAPS: 300.0000 mg | ORAL_CAPSULE | Freq: Every day | ORAL | 0 refills | Status: DC

## 2018-06-24 MED ORDER — DM-GUAIFENESIN ER 30-600 MG PO TB12: 1.0000 | ORAL_TABLET | Freq: Two times a day (BID) | ORAL | 0 refills | Status: AC

## 2018-06-24 MED ORDER — PREDNISONE 20 MG PO TABS: 40.0000 mg | ORAL_TABLET | Freq: Every day | ORAL | 0 refills | Status: DC

## 2018-06-24 MED ORDER — DM-GUAIFENESIN ER 30-600 MG PO TB12: 1.0000 | ORAL_TABLET | Freq: Two times a day (BID) | ORAL | 0 refills | Status: DC

## 2018-06-24 NOTE — Progress Notes (Signed)
Still has upper airway wheezes more than lung field wheezes. At midnight PRN treatment.

## 2018-06-24 NOTE — Progress Notes (Signed)
ANTICOAGULATION CONSULT NOTE - Follow up Scott [name] for Coumadin Indication: VTE treatment  Allergies  Allergen Reactions  . Lipitor [Atorvastatin] Other (See Comments)    Unknown rxn per pt    Patient Measurements: Height: 5\' 7"  (170.2 cm) Weight: 162 lb 14.7 oz (73.9 kg) IBW/kg (Calculated) : 66.1  Vital Signs: Temp: 97.6 F (36.4 C) (03/14 0639) Temp Source: Oral (03/14 0639) BP: 165/91 (03/14 0639) Pulse Rate: 74 (03/14 0639)  Labs: Recent Labs    06/22/18 1803 06/22/18 1815 06/23/18 0433 06/23/18 0632 06/24/18 0646  HGB 14.3  --  14.0  --  14.3  HCT 44.9  --  45.6  --  47.2  PLT 168  --  178  --  172  LABPROT 19.4*  --   --  21.2* 26.3*  INR 1.7*  --   --  1.9* 2.5*  CREATININE 1.46*  --  1.60*  --  1.41*  TROPONINI  --  0.03*  --   --   --     Estimated Creatinine Clearance: 30 mL/min (A) (by C-G formula based on SCr of 1.41 mg/dL (H)).   Medical History: Past Medical History:  Diagnosis Date  . Arthritis    "all over"  . Atrial fibrillation ([place])   . Benign localized prostatic hyperplasia with lower urinary tract symptoms (LUTS)   . Bradycardia 11/28/2015   Severe-HR in the 20s to 30s following intubation for urological procedure.  Marland Kitchen CAD- non obstructive disease by cath 3/14 cardiologist-  dr hochrein  . Chronic lower back pain   . COPD (chronic obstructive pulmonary disease) ([place])   . DDD (degenerative disc disease)   . Depression   . Dyspnea    w/ exertion and lying down (raise head)  . ETOH abuse   . GERD (gastroesophageal reflux disease)   . Glaucoma   . Glaucoma, both eyes   . History of bladder cancer urologist-- dr Jeffie Pollock   first dx 2012--  s/p TURBT's  . History of DVT of lower extremity 03/2013   bilateral   . History of pulmonary embolus (PE) 04/2013  . HTN (hypertension)   . Hyperlipidemia   . Hypertension   . LBBB (left bundle branch block)    chronic  . Non-ischemic cardiomyopathy ([place])    last echo 01-18-2017,  ef 35-40%  . NSVT (nonsustained ventricular tachycardia) ([place])    a. NSVT 06/2012; NSVT also seen during 03/2013 adm. b. Med rx. Not candidate for ICD given adv age.  Marland Kitchen PAD (peripheral artery disease) ([place])   . 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 PVCs (premature ventricular contractions)   . PVD (peripheral vascular disease) (HCC)    Right ABI .75, Left .78 (2006)  . Syncope 06/2012   a. Felt to be postural syncope related to diuretics 06/2012.  Marland Kitchen Systolic and diastolic CHF, chronic (HCC) cardiologsit-  dr hochrein   a. NICM - patent cors 06/2012, EF 40% at that time. b. 03/2013 eval: EF 20-25%.  . Vision loss, left eye    due to glaucoma  . Wears glasses     Medications:  Medications Prior to Admission  Medication Sig Dispense Refill Last Dose  . albuterol (PROVENTIL) (2.5 MG/3ML) 0.083% nebulizer solution Take 3 mLs (2.5 mg total) by nebulization every 6 (six) hours as needed for wheezing or shortness of breath. 150 mL 1 UNKNOWN  . COMBIVENT RESPIMAT 20-100  MCG/ACT AERS respimat 1 PUFF EVERY 4 HOURS AS NEEDED FOR COUGH, WHEEZING, OR SHORTNESS OF BREATH (Patient taking differently: Inhale 1 puff into the lungs every 4 (four) hours as needed for wheezing or shortness of breath. ) 4 g 4 UNKNOWN  . donepezil (ARICEPT) 10 MG tablet TAKE ONE TABLET AT BEDTIME (Patient taking differently: Take 10 mg by mouth at bedtime. ) 30 tablet 4 06/21/2018 at Unknown time  . febuxostat (ULORIC) 40 MG tablet TAKE 1 TABLET DAILY (Patient taking differently: Take 40 mg by mouth every morning. ) 30 tablet 5 06/22/2018 at Unknown time  . FEROSUL 325 (65 Fe) MG tablet Take 1 tablet (325 mg total) by mouth daily with breakfast. (Patient taking differently: Take 325 mg by mouth daily with breakfast. ) 30 tablet 4 06/22/2018 at Unknown time  . fluticasone (FLONASE) 50 MCG/ACT nasal spray Place 1 spray into both nostrils daily  as needed for allergies or rhinitis. 16 g 2 UNKNOWN  . folic acid (FOLVITE) 1 MG tablet TAKE 1 TABLET DAILY (Patient taking differently: Take 1 mg by mouth every morning. ) 30 tablet 5 06/22/2018 at Unknown time  . furosemide (LASIX) 40 MG tablet Take 1 tablet (40 mg total) by mouth 2 (two) times daily. 60 tablet 4 06/22/2018 at Unknown time  . gabapentin (NEURONTIN) 100 MG capsule TAKE 2 CAPSULES TWICE A DAY (Patient taking differently: Take 200 mg by mouth 2 (two) times daily. ) 60 capsule 2 06/22/2018 at Unknown time  . isosorbide dinitrate (ISORDIL) 30 MG tablet TAKE 1 TABLET ONCE A DAY (Patient taking differently: Take 30 mg by mouth every morning. ) 30 tablet 11 06/22/2018 at Unknown time  . metoprolol succinate (TOPROL-XL) 50 MG 24 hr tablet Take 1 tablet (50 mg total) by mouth daily. (Appt w/ PCP for blood pressure) (Patient taking differently: Take 50 mg by mouth every morning. (Appt w/ PCP for blood pressure)) 30 tablet 0 06/22/2018 at Unknown time  . MYRBETRIQ 25 MG TB24 tablet Take 25 mg by mouth every morning.    06/22/2018 at Unknown time  . nitroGLYCERIN (NITROSTAT) 0.4 MG SL tablet Place 1 tablet (0.4 mg total) under the tongue every 5 (five) minutes as needed for chest pain (up to 3 doses). 25 tablet 3 UNKNOWN  . pantoprazole (PROTONIX) 40 MG tablet TAKE 1 TABLET DAILY (Patient taking differently: Take 40 mg by mouth every morning. ) 90 tablet 0 06/22/2018 at Unknown time  . PROAIR HFA 108 (90 Base) MCG/ACT inhaler 2 PUFFS EVERY 6 HOURS AS NEEDED FOR WHEEZING OR SHORTNESS OF BREATH (Patient taking differently: Inhale 2 puffs into the lungs every 6 (six) hours as needed for wheezing or shortness of breath. ) 8.5 g 4 UNKNOWN  . sertraline (ZOLOFT) 50 MG tablet TAKE 1 TABLET IN THE MORNING (Patient taking differently: Take 50 mg by mouth every morning. ) 30 tablet 0 06/22/2018 at Unknown time  . simvastatin (ZOCOR) 10 MG tablet TAKE 1 TABLET ONCE DAILY IN THE EVENING (Patient taking differently:  Take 10 mg by mouth at bedtime. ) 90 tablet 0 06/21/2018 at Unknown time  . warfarin (COUMADIN) 2.5 MG tablet Take 5 mg Friday March 29, then take 2.5 mg daily except 5 mg each Monday. (Patient taking differently: Take 2.5-5 mg by mouth See admin instructions. Take 2.5 mg by mouth once daily except on Mondays and Fridays take 5 mg once daily) 10 tablet 0 06/22/2018 at [facility]    Assessment: Pt admitted with SOB. He has a  history of afib and VTE (both DVT and PE). He is on chronic coumadin tx and pharmacy asked to dose. INR is therapeutic.  Home dose: 5mg  on Monday and Friday; 2.5mg  all other days  Goal of Therapy:  INR 2-3 Monitor platelets by anticoagulation protocol: Yes   Plan:  Coumadin 2.5mg  x 1 PT-INR daily Monitor for S/S of bleeding  Isac Sarna, BS Vena [place], BCPS Clinical Pharmacist Pager [phone] 06/24/2018,9:13 AM

## 2018-06-24 NOTE — Progress Notes (Signed)
Removed-IV-clean, dry, intact. Reviewed d/c paperwork with patient and son. Reviewed new medications and called Dr. Manuella Ghazi to have the medications sent to [facility] [place]. Answered all questions. Wheeled stable patient and belongings to short stay where he was picked up by son to d/c to home.

## 2018-06-24 NOTE — Discharge Summary (Signed)
Physician Discharge Summary  Scott [name] NTZ:[id number] DOB: [birth year] DOA: 06/22/2018  PCP: Dettinger, Fransisca Kaufmann, MD  Admit date: 06/22/2018  Discharge date: 06/24/2018  Admitted From: Home  Disposition: Home  Recommendations for Outpatient Follow-up:  1. Follow up with PCP in 1-2 weeks 2. Continue prednisone for 5 more days as well as Cefdinir for 3 more days to finish course of treatment, urine culture with no growth. 3. Continue on home medications as otherwise prescribed and follow-up INR levels outpatient.  Home Health: None  Equipment/Devices: None  Discharge Condition: Stable  CODE STATUS: DNR  Diet recommendation: Heart Healthy  Brief/Interim Summary: Per HPI: Scott [name] a49 y.o.male,with history of chronic combined systolic and diastolic CHF (nonischemic cardiomyopathy with EF of 35 and 40% as per last echo in 2018), COPD (not on home O2), history of DVT and PE, paroxysmal A. fib on chronic Coumadin, PVD with left AKA, CAD,, GERD, glaucoma, hypertension, hyperlipidemia, BPH, history of bladder cancer with recent cystoscopy with biopsy and fulguration of bladder neck tumor on 2/28 was brought from home by his son and his nephew with fever and cough since last night. Patient lives home alone and has caregiver and family close by. Since last night he was complaining of cough with congestion and fever. His nephew checks his temperature this morning and was 102 F. Patient was also found to be slightly confused. He denies any headache, blurred vision, dizziness, nausea, vomiting, chest pain, palpitations, orthopnea, PND, abdominal pain, dysuria, diarrhea, weakness or numbness in his extremities. No history of sick contact or recent travel. His son has been in and out of the state but has been in good health. Denies any change in his medication recently. The fire department put patient on NRB and patient was satting at 94% when EMS arrived which dropped to 86% on room  air. He was given albuterol and IV Solu-Medrol and brought to the ED.  Patient was admitted with Sirs likely secondary to UTI along with possible viral bronchitis and associated COPD exacerbation.  He has improved while on treatment with IV Rocephin as well as steroids and breathing treatments.  He does not require any home oxygen and has no further symptomatology.  Urine cultures with no growth noted, but will finish course of antibiotics as noted above.  He has follow-up with his PCP on 3/20.  He will finish a course of prednisone and has home nebulizer treatments as needed.  He is otherwise wheelchair-bound and is saturating at least 92% at the bedside.  No other acute events noted during this hospitalization and patient is otherwise stable for discharge.  Discharge Diagnoses:  Principal Problem:   SIRS (systemic inflammatory response syndrome) (HCC) Active Problems:   Benign essential HTN   CAD- non obstructive disease by cath 3/14   Chronic combined systolic and diastolic heart failure (HCC)   History of pulmonary embolism (2014)   BPH (benign prostatic hyperplasia)   PAD (peripheral artery disease) ([place])   Lower urinary tract infectious disease   Atherosclerotic PVD with ulceration (HCC)   Acute kidney injury ([place])   Essential hypertension   Chronic kidney disease (CKD) stage G3a/A1, moderately decreased glomerular filtration rate (GFR) between 45-59 mL/min/1.73 square meter and albuminuria creatinine ratio less than 30 mg/g (HCC)   Paroxysmal atrial fibrillation (HCC)  Principal discharge diagnosis: Sirs secondary to UTI and viral bronchitis with mild COPD exacerbation.  Discharge Instructions  Discharge Instructions    Diet - low sodium heart healthy   Complete by:  As directed    Increase activity slowly   Complete by:  As directed      Allergies as of 06/24/2018      Reactions   Lipitor [atorvastatin] Other (See Comments)   Unknown rxn per pt      Medication List     TAKE these medications   albuterol (2.5 MG/3ML) 0.083% nebulizer solution Commonly known as:  PROVENTIL Take 3 mLs (2.5 mg total) by nebulization every 6 (six) hours as needed for wheezing or shortness of breath. What changed:  Another medication with the same name was changed. Make sure you understand how and when to take each.   ProAir HFA 108 (90 Base) MCG/ACT inhaler Generic drug:  albuterol 2 PUFFS EVERY 6 HOURS AS NEEDED FOR WHEEZING OR SHORTNESS OF BREATH What changed:  See the new instructions.   cefdinir 300 MG capsule Commonly known as:  OMNICEF Take 1 capsule (300 mg total) by mouth daily for 3 days.   Combivent Respimat 20-100 MCG/ACT Aers respimat Generic drug:  Ipratropium-Albuterol 1 PUFF EVERY 4 HOURS AS NEEDED FOR COUGH, WHEEZING, OR SHORTNESS OF BREATH What changed:  See the new instructions.   dextromethorphan-guaiFENesin 30-600 MG 12hr tablet Commonly known as:  MUCINEX DM Take 1 tablet by mouth 2 (two) times daily for 10 days.   donepezil 10 MG tablet Commonly known as:  ARICEPT TAKE ONE TABLET AT BEDTIME   febuxostat 40 MG tablet Commonly known as:  ULORIC TAKE 1 TABLET DAILY What changed:  when to take this   FeroSul 325 (65 FE) MG tablet Generic drug:  ferrous sulfate Take 1 tablet (325 mg total) by mouth daily with breakfast. What changed:  See the new instructions.   fluticasone 50 MCG/ACT nasal spray Commonly known as:  FLONASE Place 1 spray into both nostrils daily as needed for allergies or rhinitis.   folic acid 1 MG tablet Commonly known as:  FOLVITE TAKE 1 TABLET DAILY What changed:  when to take this   furosemide 40 MG tablet Commonly known as:  LASIX Take 1 tablet (40 mg total) by mouth 2 (two) times daily.   gabapentin 100 MG capsule Commonly known as:  NEURONTIN TAKE 2 CAPSULES TWICE A DAY   isosorbide dinitrate 30 MG tablet Commonly known as:  ISORDIL TAKE 1 TABLET ONCE A DAY What changed:  when to take this    metoprolol succinate 50 MG 24 hr tablet Commonly known as:  TOPROL-XL Take 1 tablet (50 mg total) by mouth daily. (Appt w/ PCP for blood pressure) What changed:  when to take this   Myrbetriq 25 MG Tb24 tablet Generic drug:  mirabegron ER Take 25 mg by mouth every morning.   nitroGLYCERIN 0.4 MG SL tablet Commonly known as:  NITROSTAT Place 1 tablet (0.4 mg total) under the tongue every 5 (five) minutes as needed for chest pain (up to 3 doses).   pantoprazole 40 MG tablet Commonly known as:  PROTONIX TAKE 1 TABLET DAILY What changed:  when to take this   predniSONE 20 MG tablet Commonly known as:  Deltasone Take 2 tablets (40 mg total) by mouth daily for 5 days.   sertraline 50 MG tablet Commonly known as:  ZOLOFT TAKE 1 TABLET IN THE MORNING   simvastatin 10 MG tablet Commonly known as:  ZOCOR TAKE 1 TABLET ONCE DAILY IN THE EVENING What changed:  See the new instructions.   warfarin 2.5 MG tablet Commonly known as:  COUMADIN Take as directed. If  you are unsure how to take this medication, talk to your nurse or doctor. Original instructions:  Take 5 mg Friday March 29, then take 2.5 mg daily except 5 mg each Monday. What changed:    how much to take  how to take this  when to take this  additional instructions      Follow-up Information    Dettinger, Fransisca Kaufmann, MD. Go in 7 day(s).   Specialties:  Family Medicine, Cardiology Why:  appt is 06/30/18 at 3:55 pm for hospital follow up  Contact information: [address] [place] [address] [phone]          Allergies  Allergen Reactions  . Lipitor [Atorvastatin] Other (See Comments)    Unknown rxn per pt    Consultations:  None   Procedures/Studies: Dg Chest 2 View  Result Date: 06/22/2018 CLINICAL DATA:  Shortness of breath with cough and fever. EXAM: CHEST - 2 VIEW COMPARISON:  03/24/2018 FINDINGS: The cardio pericardial silhouette is enlarged. There is pulmonary vascular congestion without  overt pulmonary edema. Interstitial markings are diffusely coarsened with chronic features. Stable right paratracheal opacity in the medial right lung compatible with ectasias of arch vessel anatomy as seen on CT scan from 04/11/2013. Stable bilateral hilar fullness. The visualized bony structures of the thorax are intact. Telemetry leads overlie the chest. IMPRESSION: Stable exam. Cardiomegaly with vascular congestion and chronic interstitial lung disease. Electronically Signed   By: Misty Stanley M.D.   On: 06/22/2018 18:52     Discharge Exam: Vitals:   06/24/18 0725 06/24/18 1100  BP:    Pulse:    Resp:    Temp:    SpO2: 100% 92%   Vitals:   06/24/18 0436 06/24/18 0639 06/24/18 0725 06/24/18 1100  BP:  (!) 165/91    Pulse:  74    Resp:  20    Temp:  97.6 F (36.4 C)    TempSrc:  Oral    SpO2: 96% 99% 100% 92%  Weight:  73.9 kg    Height:        General: Pt is alert, awake, not in acute distress Cardiovascular: RRR, S1/S2 +, no rubs, no gallops Respiratory: CTA bilaterally, no wheezing, no rhonchi Abdominal: Soft, NT, ND, bowel sounds + Extremities: no edema, no cyanosis    The results of significant diagnostics from this hospitalization (including imaging, microbiology, ancillary and laboratory) are listed below for reference.     Microbiology: Recent Results (from the past 240 hour(s))  Culture, blood (Routine x 2)     Status: None (Preliminary result)   Collection Time: 06/22/18  6:03 PM  Result Value Ref Range Status   Specimen Description BLOOD RIGHT FOREARM  Final   Special Requests   Final    BOTTLES DRAWN AEROBIC AND ANAEROBIC Blood Culture adequate volume   Culture   Final    NO GROWTH 2 DAYS Performed at [facility], [address], [place], [place] [address]    Report Status PENDING  Incomplete  Culture, blood (Routine x 2)     Status: None (Preliminary result)   Collection Time: 06/22/18  6:54 PM  Result Value Ref Range Status   Specimen Description  BLOOD LEFT ANTECUBITAL  Final   Special Requests   Final    BOTTLES DRAWN AEROBIC AND ANAEROBIC Blood Culture adequate volume   Culture   Final    NO GROWTH 2 DAYS Performed at [facility], [address], [place], [place] [address]  Report Status PENDING  Incomplete  Culture, Urine     Status: None   Collection Time: 06/22/18 10:07 PM  Result Value Ref Range Status   Specimen Description   Final    URINE, CLEAN CATCH Performed at [facility], [address], [place], [place] [address]    Special Requests   Final    NONE Performed at [facility], [address] [place], [place] [address]    Culture   Final    NO GROWTH Performed at [facility], [place] [address] [place], [place] [address]    Report Status 06/24/2018 FINAL  Final     Labs: BNP (last 3 results) Recent Labs    06/22/18 1803  BNP 2,263.3*   Basic Metabolic Panel: Recent Labs  Lab 06/22/18 1803 06/23/18 0433 06/24/18 0646  NA 139 143 144  K 4.3 4.1 4.3  CL 107 107 110  CO2 24 27 26   GLUCOSE 95 138* 136*  BUN 31* 35* 42*  CREATININE 1.46* 1.60* 1.41*  CALCIUM 9.3 9.1 9.5   Liver Function Tests: Recent Labs  Lab 06/22/18 1803  AST 20  ALT 13  ALKPHOS 191*  BILITOT 0.5  PROT 7.1  ALBUMIN 3.9   No results for input(s): LIPASE, AMYLASE in the last 168 hours. No results for input(s): AMMONIA in the last 168 hours. CBC: Recent Labs  Lab 06/22/18 1803 06/23/18 0433 06/24/18 0646  WBC 6.4 8.6 11.1*  NEUTROABS 5.5  --   --   HGB 14.3 14.0 14.3  HCT 44.9 45.6 47.2  MCV 86.8 89.1 89.9  PLT 168 178 172   Cardiac Enzymes: Recent Labs  Lab 06/22/18 1815  TROPONINI 0.03*   BNP: Invalid input(s): POCBNP CBG: Recent Labs  Lab 06/23/18 0729 06/24/18 0740  GLUCAP 137* 118*   D-Dimer No results for input(s): DDIMER in the last 72 hours. Hgb A1c No results for input(s): HGBA1C in the last 72 hours. Lipid Profile No results for input(s): CHOL, HDL, LDLCALC, TRIG, CHOLHDL,  LDLDIRECT in the last 72 hours. Thyroid function studies No results for input(s): TSH, T4TOTAL, T3FREE, THYROIDAB in the last 72 hours.  Invalid input(s): FREET3 Anemia work up No results for input(s): VITAMINB12, FOLATE, FERRITIN, TIBC, IRON, RETICCTPCT in the last 72 hours. Urinalysis    Component Value Date/Time   COLORURINE YELLOW 06/22/2018 2112   APPEARANCEUR CLOUDY (A) 06/22/2018 2112   APPEARANCEUR Cloudy (A) 03/31/2017 1413   LABSPEC 1.009 06/22/2018 2112   PHURINE 5.0 06/22/2018 2112   GLUCOSEU NEGATIVE 06/22/2018 2112   HGBUR MODERATE (A) 06/22/2018 2112   BILIRUBINUR NEGATIVE 06/22/2018 2112   BILIRUBINUR Negative 03/31/2017 1413   KETONESUR NEGATIVE 06/22/2018 2112   PROTEINUR NEGATIVE 06/22/2018 2112   UROBILINOGEN negative 05/19/2015 1203   UROBILINOGEN 1.0 10/22/2014 1804   NITRITE NEGATIVE 06/22/2018 2112   LEUKOCYTESUR SMALL (A) 06/22/2018 2112   Sepsis Labs Invalid input(s): PROCALCITONIN,  WBC,  LACTICIDVEN Microbiology Recent Results (from the past 240 hour(s))  Culture, blood (Routine x 2)     Status: None (Preliminary result)   Collection Time: 06/22/18  6:03 PM  Result Value Ref Range Status   Specimen Description BLOOD RIGHT FOREARM  Final   Special Requests   Final    BOTTLES DRAWN AEROBIC AND ANAEROBIC Blood Culture adequate volume   Culture   Final    NO GROWTH 2 DAYS Performed at [facility], [address], [place], [place] [address]    Report Status PENDING  Incomplete  Culture, blood (Routine x 2)     Status: None (Preliminary result)   Collection Time: 06/22/18  6:54 PM  Result Value Ref Range Status   Specimen Description BLOOD LEFT ANTECUBITAL  Final   Special Requests   Final    BOTTLES DRAWN AEROBIC AND ANAEROBIC Blood Culture adequate volume   Culture   Final    NO GROWTH 2 DAYS Performed at [facility], [address], [place], [place] [address]    Report Status PENDING  Incomplete  Culture, Urine     Status: None    Collection Time: 06/22/18 10:07 PM  Result Value Ref Range Status   Specimen Description   Final    URINE, CLEAN CATCH Performed at [facility], [address] [place], [place] [address]    Special Requests   Final    NONE Performed at [facility], [address], [place], [place] [address]    Culture   Final    NO GROWTH Performed at [facility], [place] [address], [place], [place] [address]    Report Status 06/24/2018 FINAL  Final     Time coordinating discharge: 35 minutes  SIGNED:   Rodena Goldmann, DO [facility] 06/24/2018, 11:16 AM  If 7PM-7AM, please contact night-coverage [email or web address] Password [email or web address]

## 2018-06-27 LAB — CULTURE, BLOOD (ROUTINE X 2)
Culture: NO GROWTH
Culture: NO GROWTH
Special Requests: ADEQUATE
Special Requests: ADEQUATE

## 2018-06-28 ENCOUNTER — Telehealth: Payer: Self-pay | Admitting: Family Medicine

## 2018-06-28 ENCOUNTER — Encounter: Payer: Medicare Other | Admitting: Pharmacist Clinician (PhC)/ Clinical Pharmacy Specialist

## 2018-06-28 NOTE — Telephone Encounter (Signed)
Pt is scheduled for hosp f/u for 3/20, caregiver is calling to see if the pt is needing to come in for his apt then, son had brought up to the caregiver wanting to know if he would need to come out for that. Please call the pt back on his number and either mrs. Adah Perl or [facility] the caregiver will speak to you about the apt.

## 2018-06-28 NOTE — Telephone Encounter (Signed)
Caregiver aware.

## 2018-06-28 NOTE — Telephone Encounter (Signed)
Unless he is ill I think he should stay home, if he has any kind of illness that he wants to be seen for if he is sick having come in but if it is just for routine checkup and stable then stay home.

## 2018-06-29 ENCOUNTER — Other Ambulatory Visit: Payer: Self-pay | Admitting: Family Medicine

## 2018-06-30 ENCOUNTER — Ambulatory Visit: Payer: Medicare Other | Admitting: Family Medicine

## 2018-07-04 ENCOUNTER — Other Ambulatory Visit: Payer: Self-pay

## 2018-07-04 ENCOUNTER — Emergency Department (HOSPITAL_COMMUNITY): Payer: Medicare Other

## 2018-07-04 ENCOUNTER — Emergency Department (HOSPITAL_COMMUNITY)
Admission: EM | Admit: 2018-07-04 | Discharge: 2018-07-04 | Disposition: A | Discharge status: Home / Self Care | Payer: Medicare Other | Attending: Emergency Medicine | Admitting: Emergency Medicine

## 2018-07-04 ENCOUNTER — Telehealth (INDEPENDENT_AMBULATORY_CARE_PROVIDER_SITE_OTHER): Payer: Medicare Other | Admitting: Family Medicine

## 2018-07-04 ENCOUNTER — Encounter (HOSPITAL_COMMUNITY): Payer: Self-pay | Admitting: Emergency Medicine

## 2018-07-04 ENCOUNTER — Telehealth: Payer: Self-pay | Admitting: Family Medicine

## 2018-07-04 DIAGNOSIS — F329 Major depressive disorder, single episode, unspecified: Secondary | ICD-10-CM | POA: Diagnosis not present

## 2018-07-04 DIAGNOSIS — I5042 Chronic combined systolic (congestive) and diastolic (congestive) heart failure: Secondary | ICD-10-CM | POA: Diagnosis not present

## 2018-07-04 DIAGNOSIS — Z87891 Personal history of nicotine dependence: Secondary | ICD-10-CM | POA: Insufficient documentation

## 2018-07-04 DIAGNOSIS — I130 Hypertensive heart and chronic kidney disease with heart failure and stage 1 through stage 4 chronic kidney disease, or unspecified chronic kidney disease: Secondary | ICD-10-CM | POA: Insufficient documentation

## 2018-07-04 DIAGNOSIS — B370 Candidal stomatitis: Secondary | ICD-10-CM

## 2018-07-04 DIAGNOSIS — I2510 Atherosclerotic heart disease of native coronary artery without angina pectoris: Secondary | ICD-10-CM | POA: Insufficient documentation

## 2018-07-04 DIAGNOSIS — Z8551 Personal history of malignant neoplasm of bladder: Secondary | ICD-10-CM | POA: Diagnosis not present

## 2018-07-04 DIAGNOSIS — F1010 Alcohol abuse, uncomplicated: Secondary | ICD-10-CM | POA: Diagnosis not present

## 2018-07-04 DIAGNOSIS — R0602 Shortness of breath: Secondary | ICD-10-CM

## 2018-07-04 DIAGNOSIS — I252 Old myocardial infarction: Secondary | ICD-10-CM | POA: Insufficient documentation

## 2018-07-04 DIAGNOSIS — N183 Chronic kidney disease, stage 3 (moderate): Secondary | ICD-10-CM | POA: Diagnosis not present

## 2018-07-04 DIAGNOSIS — Z7901 Long term (current) use of anticoagulants: Secondary | ICD-10-CM | POA: Diagnosis not present

## 2018-07-04 DIAGNOSIS — J209 Acute bronchitis, unspecified: Secondary | ICD-10-CM | POA: Diagnosis not present

## 2018-07-04 DIAGNOSIS — J449 Chronic obstructive pulmonary disease, unspecified: Secondary | ICD-10-CM | POA: Insufficient documentation

## 2018-07-04 DIAGNOSIS — R531 Weakness: Secondary | ICD-10-CM

## 2018-07-04 DIAGNOSIS — R918 Other nonspecific abnormal finding of lung field: Secondary | ICD-10-CM | POA: Diagnosis not present

## 2018-07-04 DIAGNOSIS — J40 Bronchitis, not specified as acute or chronic: Secondary | ICD-10-CM

## 2018-07-04 DIAGNOSIS — Z79899 Other long term (current) drug therapy: Secondary | ICD-10-CM | POA: Insufficient documentation

## 2018-07-04 DIAGNOSIS — R05 Cough: Secondary | ICD-10-CM | POA: Diagnosis not present

## 2018-07-04 DIAGNOSIS — B379 Candidiasis, unspecified: Secondary | ICD-10-CM | POA: Diagnosis not present

## 2018-07-04 LAB — COMPREHENSIVE METABOLIC PANEL
ALT: 19 U/L (ref 0–44)
AST: 16 U/L (ref 15–41)
Albumin: 3.3 g/dL — ABNORMAL LOW (ref 3.5–5.0)
Alkaline Phosphatase: 121 U/L (ref 38–126)
Anion gap: 6 (ref 5–15)
BUN: 26 mg/dL — ABNORMAL HIGH (ref 8–23)
CO2: 31 mmol/L (ref 22–32)
Calcium: 8.8 mg/dL — ABNORMAL LOW (ref 8.9–10.3)
Chloride: 107 mmol/L (ref 98–111)
Creatinine, Ser: 1.4 mg/dL — ABNORMAL HIGH (ref 0.61–1.24)
GFR calc Af Amer: 49 mL/min — ABNORMAL LOW (ref >60)
GFR calc non Af Amer: 43 mL/min — ABNORMAL LOW (ref >60)
Glucose, Bld: 104 mg/dL — ABNORMAL HIGH (ref 70–99)
Potassium: 3.4 mmol/L — ABNORMAL LOW (ref 3.5–5.1)
Sodium: 144 mmol/L (ref 135–145)
Total Bilirubin: 0.4 mg/dL (ref 0.3–1.2)
Total Protein: 6.1 g/dL — ABNORMAL LOW (ref 6.5–8.1)

## 2018-07-04 LAB — CBC WITH DIFFERENTIAL/PLATELET
Abs Immature Granulocytes: 0.03 10*3/uL (ref 0.00–0.07)
Basophils Absolute: 0 10*3/uL (ref 0.0–0.1)
Basophils Relative: 0 %
Eosinophils Absolute: 0.2 10*3/uL (ref 0.0–0.5)
Eosinophils Relative: 3 %
HCT: 43.8 % (ref 39.0–52.0)
Hemoglobin: 13.3 g/dL (ref 13.0–17.0)
Immature Granulocytes: 0 %
Lymphocytes Relative: 15 %
Lymphs Abs: 1.1 10*3/uL (ref 0.7–4.0)
MCH: 27 pg (ref 26.0–34.0)
MCHC: 30.4 g/dL (ref 30.0–36.0)
MCV: 88.8 fL (ref 80.0–100.0)
Monocytes Absolute: 0.5 10*3/uL (ref 0.1–1.0)
Monocytes Relative: 6 %
Neutro Abs: 5.3 10*3/uL (ref 1.7–7.7)
Neutrophils Relative %: 76 %
Platelets: 175 10*3/uL (ref 150–400)
RBC: 4.93 MIL/uL (ref 4.22–5.81)
RDW: 15 % (ref 11.5–15.5)
WBC: 7 10*3/uL (ref 4.0–10.5)
nRBC: 0 % (ref 0.0–0.2)

## 2018-07-04 LAB — BRAIN NATRIURETIC PEPTIDE: B Natriuretic Peptide: 1141 pg/mL — ABNORMAL HIGH (ref 0.0–100.0)

## 2018-07-04 LAB — LACTIC ACID, PLASMA: Lactic Acid, Venous: 1.2 mmol/L (ref 0.5–1.9)

## 2018-07-04 MED ORDER — IOHEXOL 300 MG/ML  SOLN
100.0000 mL | Freq: Once | INTRAMUSCULAR | Status: AC | PRN
  Administered 2018-07-04: 75 mL via INTRAVENOUS

## 2018-07-04 MED ORDER — NYSTATIN 100000 UNIT/ML MT SUSP: 500000.0000 [IU] | Freq: Four times a day (QID) | OROMUCOSAL | 0 refills | Status: DC

## 2018-07-04 MED ORDER — SODIUM CHLORIDE 0.9 % IV BOLUS
500.0000 mL | Freq: Once | INTRAVENOUS | Status: AC
  Administered 2018-07-04: 500 mL via INTRAVENOUS

## 2018-07-04 MED ORDER — SODIUM CHLORIDE 0.9 % IV SOLN: INTRAVENOUS | Status: DC

## 2018-07-04 MED ORDER — PREDNISONE 50 MG PO TABS
60.0000 mg | ORAL_TABLET | Freq: Once | ORAL | Status: AC
  Administered 2018-07-04: 60 mg via ORAL
  Filled 2018-07-04: qty 1

## 2018-07-04 MED ORDER — PREDNISONE 10 MG PO TABS: ORAL_TABLET | ORAL | 0 refills | Status: DC

## 2018-07-04 NOTE — ED Notes (Signed)
ED Provider at bedside. 

## 2018-07-04 NOTE — ED Notes (Signed)
Patient transported to X-ray 

## 2018-07-04 NOTE — ED Notes (Addendum)
Pt's son in and out of room multiple times to Chief of Staff. Charge RN notified, and instructed by charge that he is not to leave room. If something is needed, he needs to use call light. Visitor allowed by [employer].

## 2018-07-04 NOTE — ED Notes (Signed)
Removed pt off of oxygen. Saturation 86-87% on RA.

## 2018-07-04 NOTE — Telephone Encounter (Signed)
Had telephone encounter with pt and referred back to ED due to concerns for pneumonia.

## 2018-07-04 NOTE — Progress Notes (Signed)
Virtual Visit via telephone Note  I connected with Scott [name] on 07/04/18 at 1150 by telephone and verified that I am speaking with the correct person using two identifiers. Scott [name] is currently located at home and caregiver is currently with them during visit. The provider, [name], FNP is located in their office at time of visit.  I discussed the limitations, risks, security and privacy concerns of performing an evaluation and management service by telephone and the availability of in person appointments. I also discussed with the patient that there may be a patient responsible charge related to this service. The patient expressed understanding and agreed to proceed.  Subjective:  Patient ID: Scott [name], male    DOB: May 10, 1921, 85 y.o.   MRN: [id number]  Chief Complaint:  Cough, weakness, shortness of breath  HPI: Scott [name] is a 85 y.o. male presenting on 07/04/2018 for cough, weakness, shortness of breath  Pt reports increasing cough, shortness of breath, weakness, and wheezing. Pt was recently discharged from the hospital due to acute febrile illness and UTI. Pt states he was doing ok when he came home and then on Thursday he started having the above symptoms. Pt states he has not measured his temperature. He denies chest pain. States he does have gum pain. He states he feels worn out. Caregiver states his activity level has decreased greatly.    Relevant past medical, surgical, family, and social history reviewed and updated as indicated.  Allergies and medications reviewed and updated.   Past Medical History:  Diagnosis Date  . Arthritis    "all over"  . Atrial fibrillation ([place])   . Benign localized prostatic hyperplasia with lower urinary tract symptoms (LUTS)   . Bradycardia 11/28/2015   Severe-HR in the 20s to 30s following intubation for urological procedure.  Marland Kitchen CAD- non obstructive disease by cath 3/14 cardiologist-  dr hochrein  . Chronic lower back  pain   . COPD (chronic obstructive pulmonary disease) ([place])   . DDD (degenerative disc disease)   . Depression   . Dyspnea    w/ exertion and lying down (raise head)  . ETOH abuse   . GERD (gastroesophageal reflux disease)   . Glaucoma   . Glaucoma, both eyes   . History of bladder cancer urologist-- dr Jeffie Pollock   first dx 2012--  s/p TURBT's  . History of DVT of lower extremity 03/2013   bilateral   . History of pulmonary embolus (PE) 04/2013  . HTN (hypertension)   . Hyperlipidemia   . Hypertension   . LBBB (left bundle branch block)    chronic  . Non-ischemic cardiomyopathy ([place])    last echo 01-18-2017, ef 35-40%  . NSVT (nonsustained ventricular tachycardia) ([place])    a. NSVT 06/2012; NSVT also seen during 03/2013 adm. b. Med rx. Not candidate for ICD given adv age.  Marland Kitchen PAD (peripheral artery disease) ([place])   . 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 PVCs (premature ventricular contractions)   . PVD (peripheral vascular disease) ([facility])    Right ABI .75, Left .78 (2006)  . Syncope 06/2012   a. Felt to be postural syncope related to diuretics 06/2012.  Marland Kitchen Systolic and diastolic CHF, chronic ([facility]) cardiologsit-  dr hochrein   a. NICM - patent cors 06/2012, EF 40% at that time. b. 03/2013 eval: EF 20-25%.  . Vision loss, left eye  due to glaucoma  . Wears glasses     Past Surgical History:  Procedure Laterality Date  . AMPUTATION Left 10/16/2014   Procedure: AMPUTATION ABOVE KNEE- LEFT;  Surgeon: Mal Misty, MD;  Location: [facility];  Service: Vascular;  Laterality: Left;  . CARDIAC CATHETERIZATION  05-11-2004  DR [facility]   MINIMAL CORANARY PLAQUE/ NORMAL LVF/ EF 55%/  NON-OBSTRUCTIVE LAD 25%  . CARDIAC CATHETERIZATION  06/18/2012   dr hochrein   mild luminal irregularities of coronaries, ef 45-50%  . CARDIOVASCULAR STRESS TEST  10-15-2010   LOW RISK NUCLEAR STUDY/ NO EVIDENCE OF  ISCHEMIA/ NORMAL EF  . CATARACT EXTRACTION W/ INTRAOCULAR LENS  IMPLANT, BILATERAL Bilateral   . CYSTOSCOPY  10/07/2011   Procedure: CYSTOSCOPY;  Surgeon: Malka So, MD;  Location: [facility];  Service: Urology;  Laterality: N/A;  . CYSTOSCOPY WITH BIOPSY  03/14/2012   Procedure: CYSTOSCOPY WITH BIOPSY;  Surgeon: Malka So, MD;  Location: [facility];  Service: Urology;  Laterality: N/A;  WITH FULGURATION  . CYSTOSCOPY WITH BIOPSY N/A 12/09/2016   Procedure: CYSTOSCOPY WITH BIOPSY AND FULGURATION;  Surgeon: [name], MD;  Location: [facility];  Service: Urology;  Laterality: N/A;  . CYSTOSCOPY WITH BIOPSY N/A 12/20/2017   Procedure: CYSTOSCOPY WITH BIOPSY WITH FULGURATION;  Surgeon: [name], MD;  Location: [facility];  Service: Urology;  Laterality: N/A;  . CYSTOSCOPY WITH BIOPSY N/A 06/09/2018   Procedure: CYSTOSCOPY WITH BIOPSY AND FULGURATION;  Surgeon: [name], MD;  Location: [facility];  Service: Urology;  Laterality: N/A;  . CYSTOSCOPY WITH FULGERATION N/A 01/20/2016   Procedure: CYSTOSCOPY, BIOPSY WITH FULGERATION OF URTHRAL TUMOR;  Surgeon: [name], MD;  Location: [facility];  Service: Urology;  Laterality: N/A;  . CYSTOSCOPY WITH FULGERATION N/A 06/01/2016   Procedure: CYSTOSCOPY WITH FULGERATION urethral tumors and bladder neck;  Surgeon: [name], MD;  Location: [facility];  Service: Urology;  Laterality: N/A;  . SHOULDER SURGERY  1970's  . TONSILLECTOMY    . TRANSTHORACIC ECHOCARDIOGRAM  01-18-2017   dr hochrein   moderate LVH, ef 35-40%, diffuse hypokinesis/  trivial AR and MR/  mild TR  . TRANSURETHRAL RESECTION OF BLADDER TUMOR  10/07/2011   Procedure: TRANSURETHRAL RESECTION OF BLADDER TUMOR (TURBT);  Surgeon: Malka So, MD;  Location[facility];  Service: Urology;  Laterality: N/A;  . TRANSURETHRAL RESECTION OF BLADDER TUMOR N/A 07/10/2015   Procedure: TRANSURETHRAL RESECTION OF BLADDER TUMOR (TURBT), CYSTOSCOPY ;  Surgeon: [name], MD;  Location:  [facility];  Service: Urology;  Laterality: N/A;  . TRANSURETHRAL RESECTION OF PROSTATE  03/14/2012   Procedure: TRANSURETHRAL RESECTION OF THE PROSTATE WITH GYRUS INSTRUMENTS;  Surgeon: Malka So, MD;  Location: [facility];  Service: Urology;  Laterality: N/A;    Social History   Socioeconomic History  . Marital status: Married    Spouse name: Not on file  . Number of children: 1  . Years of education: Not on file  . Highest education level: Not on file  Occupational History  . Occupation: Retired  Scientific laboratory technician  . Financial resource strain: Not on file  . Food insecurity:    Worry: Not on file    Inability: Not on file  . Transportation needs:    Medical: Not on file    Non-medical: Not on file  Tobacco Use  . Smoking status: Former Smoker    Packs/day: 1.00    Years: 35.00    Pack years: 35.00  Types: Cigarettes    Start date: 04/12/1942    Last attempt to quit: 04/13/1975    Years since quitting: 43.2  . Smokeless tobacco: Never Used  Substance and Sexual Activity  . Alcohol use: Yes    Alcohol/week: 7.0 standard drinks    Types: 7 Cans of beer per week  . Drug use: No  . Sexual activity: Never  Lifestyle  . Physical activity:    Days per week: Not on file    Minutes per session: Not on file  . Stress: Not on file  Relationships  . Social connections:    Talks on phone: Not on file    Gets together: Not on file    Attends religious service: Not on file    Active member of club or organization: Not on file    Attends meetings of clubs or organizations: Not on file    Relationship status: Not on file  . Intimate partner violence:    Fear of current or ex partner: Not on file    Emotionally abused: Not on file    Physically abused: Not on file    Forced sexual activity: Not on file  Other Topics Concern  . Not on file  Social History Narrative   Lives in [place] with spouse who is s/p spinal cord injury.  He has one grown son who lives in [place].  He is retired  but worked for [employer] for years and lives in [place].    Outpatient Encounter Medications as of 07/04/2018  Medication Sig  . albuterol (PROVENTIL) (2.5 MG/3ML) 0.083% nebulizer solution Take 3 mLs (2.5 mg total) by nebulization every 6 (six) hours as needed for wheezing or shortness of breath.  . COMBIVENT RESPIMAT 20-100 MCG/ACT AERS respimat 1 PUFF EVERY 4 HOURS AS NEEDED FOR COUGH, WHEEZING, OR SHORTNESS OF BREATH (Patient taking differently: Inhale 1 puff into the lungs every 4 (four) hours as needed for wheezing or shortness of breath. )  . dextromethorphan-guaiFENesin (MUCINEX DM) 30-600 MG 12hr tablet Take 1 tablet by mouth 2 (two) times daily for 10 days.  Marland Kitchen donepezil (ARICEPT) 10 MG tablet TAKE ONE TABLET AT BEDTIME (Patient taking differently: Take 10 mg by mouth at bedtime. )  . febuxostat (ULORIC) 40 MG tablet TAKE 1 TABLET DAILY (Patient taking differently: Take 40 mg by mouth every morning. )  . FEROSUL 325 (65 Fe) MG tablet Take 1 tablet (325 mg total) by mouth daily with breakfast. (Patient taking differently: Take 325 mg by mouth daily with breakfast. )  . fluticasone (FLONASE) 50 MCG/ACT nasal spray Place 1 spray into both nostrils daily as needed for allergies or rhinitis.  . folic acid (FOLVITE) 1 MG tablet TAKE 1 TABLET DAILY (Patient taking differently: Take 1 mg by mouth every morning. )  . furosemide (LASIX) 40 MG tablet Take 1 tablet (40 mg total) by mouth 2 (two) times daily.  Marland Kitchen gabapentin (NEURONTIN) 100 MG capsule TAKE 2 CAPSULES TWICE A DAY (Patient taking differently: Take 200 mg by mouth 2 (two) times daily. )  . isosorbide dinitrate (ISORDIL) 30 MG tablet TAKE 1 TABLET ONCE A DAY (Patient taking differently: Take 30 mg by mouth every morning. )  . metoprolol succinate (TOPROL-XL) 50 MG 24 hr tablet Take 1 tablet (50 mg total) by mouth every morning. (Appt w/ PCP for blood pressure)  . MYRBETRIQ 25 MG TB24 tablet Take 25 mg by mouth every morning.   .  nitroGLYCERIN (NITROSTAT) 0.4 MG SL  tablet Place 1 tablet (0.4 mg total) under the tongue every 5 (five) minutes as needed for chest pain (up to 3 doses).  . pantoprazole (PROTONIX) 40 MG tablet TAKE 1 TABLET DAILY (Patient taking differently: Take 40 mg by mouth every morning. )  . PROAIR HFA 108 (90 Base) MCG/ACT inhaler 2 PUFFS EVERY 6 HOURS AS NEEDED FOR WHEEZING OR SHORTNESS OF BREATH (Patient taking differently: Inhale 2 puffs into the lungs every 6 (six) hours as needed for wheezing or shortness of breath. )  . sertraline (ZOLOFT) 50 MG tablet TAKE 1 TABLET IN THE MORNING (Patient taking differently: Take 50 mg by mouth every morning. )  . simvastatin (ZOCOR) 10 MG tablet TAKE 1 TABLET ONCE DAILY IN THE EVENING (Patient taking differently: Take 10 mg by mouth at bedtime. )  . warfarin (COUMADIN) 2.5 MG tablet Take 5 mg Friday March 29, then take 2.5 mg daily except 5 mg each Monday. (Patient taking differently: Take 2.5-5 mg by mouth See admin instructions. Take 2.5 mg by mouth once daily except on Mondays and Fridays take 5 mg once daily)   No facility-administered encounter medications on file as of 07/04/2018.     Allergies  Allergen Reactions  . Lipitor [Atorvastatin] Other (See Comments)    Unknown rxn per pt    Review of Systems  Constitutional: Positive for activity change, appetite change, chills and fatigue. Negative for diaphoresis, fever and unexpected weight change.  HENT:       Mouth pain  Respiratory: Positive for cough, shortness of breath and wheezing. Negative for chest tightness.   Cardiovascular: Negative for chest pain, palpitations and leg swelling.  Gastrointestinal: Negative for abdominal pain.  Skin: Negative for color change and pallor.  Neurological: Positive for weakness. Negative for dizziness, light-headedness and headaches.  Psychiatric/Behavioral: Negative for confusion.  All other systems reviewed and are negative.        Observations/Objective:  Pt alert and answering questions appropriately. Pt able to speak in full sentences.   Assessment and Plan: Diagnoses and all orders for this visit:  Shortness of breath Weakness Symptoms concerning for pneumonia. Due to recent hospital admission, cough, shortness of breath, weakness, and wheezing, I suggested the pt go to the ED for further evaluation and treatment. Unable to bring pt into the office today for physical exam and chest xray due to COVID-19 restrictions.     Follow Up Instructions: Pts caregiver to take pt to the ED fur further evaluation.     I discussed the assessment and treatment plan with the patient. The patient was provided an opportunity to ask questions and all were answered. The patient agreed with the plan and demonstrated an understanding of the instructions.   The patient was advised to call back or seek an in-person evaluation if the symptoms worsen or if the condition fails to improve as anticipated.  The above assessment and management plan was discussed with the patient. The patient verbalized understanding of and has agreed to the management plan. Patient is aware to call the clinic if symptoms persist or worsen. Patient is aware when to return to the clinic for a follow-up visit. Patient educated on when it is appropriate to go to the emergency department.    I provided 15 minutes of non-face-to-face time during this encounter. The call started at 1150. The call ended at 1205.   [name], FNP-C [facility] [address] [place], [place] [address] [phone]

## 2018-07-04 NOTE — ED Provider Notes (Addendum)
[facility] EMERGENCY DEPARTMENT Provider Note   CSN: [id number] Arrival date & time: 07/04/18  1305    History   Chief Complaint Chief Complaint  Patient presents with  . Cough    HPI Scott [name] is a 85 y.o. male.     Patient brought in by his son.  Patient had recent admission March 12 through March 14.  Where he was admitted for probably sepsis.  I think may be secondary to UTI.  Concluding at the time of discharge they felt that maybe he had viral bronchitis related to his COPD.  He did also does have chronic CHF.  Patient is not on home oxygen.  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  Patient was discharged home on prednisone for 5 days and Cefdinar for 3 days.  According to son patient initially got a little bit better but cough never went completely away.  And then the cough started to get worse here in the last few days.  Patient's has not really been out of his house he does have a caretaker that comes in and takes care of him.  He does live by himself.  He has had no direct coronavirus contact.  And no family members that are sick.  No history of any recent fevers.     Past Medical History:  Diagnosis Date  . Arthritis    "all over"  . Atrial fibrillation ([place])   . Benign localized prostatic hyperplasia with lower urinary tract symptoms (LUTS)   . Bradycardia 11/28/2015   Severe-HR in the 20s to 30s following intubation for urological procedure.  Marland Kitchen CAD- non obstructive disease by cath 3/14 cardiologist-  dr hochrein  . Chronic lower back pain   . COPD (chronic obstructive pulmonary disease) ([place])   . DDD (degenerative disc disease)   . Depression   . Dyspnea    w/ exertion and lying down (raise head)  . ETOH abuse   . GERD (gastroesophageal reflux disease)   . Glaucoma   . Glaucoma, both eyes   . History of bladder cancer urologist-- dr Jeffie Pollock   first dx 2012--  s/p TURBT's  .  History of DVT of lower extremity 03/2013   bilateral   . History of pulmonary embolus (PE) 04/2013  . HTN (hypertension)   . Hyperlipidemia   . Hypertension   . LBBB (left bundle branch block)    chronic  . Non-ischemic cardiomyopathy ([place])    last echo 01-18-2017, ef 35-40%  . NSVT (nonsustained ventricular tachycardia) ([place])    a. NSVT 06/2012; NSVT also seen during 03/2013 adm. b. Med rx. Not candidate for ICD given adv age.  Marland Kitchen PAD (peripheral artery disease) ([place])   . 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 PVCs (premature ventricular contractions)   . PVD (peripheral vascular disease) (HCC)    Right ABI .75, Left .78 (2006)  . Syncope 06/2012   a. Felt to be postural syncope related to diuretics 06/2012.  Marland Kitchen Systolic and diastolic CHF, chronic (HCC) cardiologsit-  dr hochrein   a. NICM - patent cors 06/2012, EF 40% at that time. b. 03/2013 eval: EF 20-25%.  . Vision loss, left eye    due to glaucoma  . Wears glasses     Patient Active Problem List   Diagnosis Date Noted  . SIRS (  systemic inflammatory response syndrome) ([place]) 06/22/2018  . Paroxysmal atrial fibrillation ([place]) 01/18/2017  . PVC (pulmonary venous congestion) 01/18/2017  . Thoracic aortic atherosclerosis ([place]) 11/26/2016  . Thrombocytopenia ([place]) 08/04/2016  . Chronic kidney disease (CKD) stage G3a/A1, moderately decreased glomerular filtration rate (GFR) between 45-59 mL/min/1.73 square meter and albuminuria creatinine ratio less than 30 mg/g (HCC) 08/04/2016  . Essential hypertension 11/29/2015  . Bradycardia 11/28/2015  . Malignant neoplasm of lateral wall of urinary bladder ([place]) 07/10/2015  . Status post above knee amputation of left lower extremity 10/18/2014  . Tachyarrhythmia 10/05/2014  . Aspiration pneumonia ([place]) 10/05/2014  . Lower urinary tract infectious disease 10/03/2014  . Sepsis due to other etiology  ([place]) 10/03/2014  . Gangrene of toe ([place]) 10/03/2014  . Ischemic ulcer of left foot ([place]) 10/03/2014  . Atherosclerotic PVD with ulceration ([place]) 10/03/2014  . Warfarin-induced coagulopathy ([place]) 10/03/2014  . Atrial fibrillation with RVR ([place]) 10/03/2014  . Acute kidney injury ([place]) 10/03/2014  . Hyperkalemia 10/03/2014  . Cellulitis of left leg 10/03/2014  . PAD (peripheral artery disease) ([place]) 09/24/2014  . NSTEMI (non-ST elevated myocardial infarction) ([place]) 10/09/2013  . High risk medication use 05/17/2013  . BPH (benign prostatic hyperplasia) 05/17/2013  . Vitamin D deficiency 05/17/2013  . History of pulmonary embolism (2014) 04/17/2013  . NSVT (nonsustained ventricular tachycardia)- not an ICD candidate 04/17/2013  . NICM (nonischemic cardiomyopathy)- EF 20-25% by Echo 04/12/13 04/17/2013  . Chronic combined systolic and diastolic heart failure ([place]) 03/15/2013  . DVT of lower extremity, bilateral ([place]) 03/15/2013  . Fatigue 11/02/2012  . Constipation 11/02/2012  . Alcohol abuse 08/27/2012  . Syncope 06/21/2012  . Bladder cancer ([place]) 03/15/2012  . Benign localized hyperplasia of prostate with urinary obstruction 03/15/2012  . Benign essential HTN   . Hyperlipidemia   . PVCs (premature ventricular contractions)   . CAD- non obstructive disease by cath 3/14     Past Surgical History:  Procedure Laterality Date  . AMPUTATION Left 10/16/2014   Procedure: AMPUTATION ABOVE KNEE- LEFT;  Surgeon: Mal Misty, MD;  Location: [facility];  Service: Vascular;  Laterality: Left;  . CARDIAC CATHETERIZATION  05-11-2004  DR [facility]   MINIMAL CORANARY PLAQUE/ NORMAL LVF/ EF 55%/  NON-OBSTRUCTIVE LAD 25%  . CARDIAC CATHETERIZATION  06/18/2012   dr hochrein   mild luminal irregularities of coronaries, ef 45-50%  . CARDIOVASCULAR STRESS TEST  10-15-2010   LOW RISK NUCLEAR STUDY/ NO EVIDENCE OF ISCHEMIA/ NORMAL EF  . CATARACT EXTRACTION W/ INTRAOCULAR LENS  IMPLANT, BILATERAL Bilateral   .  CYSTOSCOPY  10/07/2011   Procedure: CYSTOSCOPY;  Surgeon: Malka So, MD;  Location[facility];  Service: Urology;  Laterality: N/A;  . CYSTOSCOPY WITH BIOPSY  03/14/2012   Procedure: CYSTOSCOPY WITH BIOPSY;  Surgeon: Malka So, MD;  Location: [facility];  Service: Urology;  Laterality: N/A;  WITH FULGURATION  . CYSTOSCOPY WITH BIOPSY N/A 12/09/2016   Procedure: CYSTOSCOPY WITH BIOPSY AND FULGURATION;  Surgeon: [name], MD;  Location: [facility];  Service: Urology;  Laterality: N/A;  . CYSTOSCOPY WITH BIOPSY N/A 12/20/2017   Procedure: CYSTOSCOPY WITH BIOPSY WITH FULGURATION;  Surgeon: [name], MD;  Location: [facility];  Service: Urology;  Laterality: N/A;  . CYSTOSCOPY WITH BIOPSY N/A 06/09/2018   Procedure: CYSTOSCOPY WITH BIOPSY AND FULGURATION;  Surgeon: [name], MD;  Location: [facility];  Service: Urology;  Laterality: N/A;  . CYSTOSCOPY WITH FULGERATION N/A 01/20/2016   Procedure: CYSTOSCOPY, BIOPSY WITH FULGERATION  OF URTHRAL TUMOR;  Surgeon: [name], MD;  Location: [facility];  Service: Urology;  Laterality: N/A;  . CYSTOSCOPY WITH FULGERATION N/A 06/01/2016   Procedure: CYSTOSCOPY WITH FULGERATION urethral tumors and bladder neck;  Surgeon: [name], MD;  Location: [facility];  Service: Urology;  Laterality: N/A;  . SHOULDER SURGERY  1970's  . TONSILLECTOMY    . TRANSTHORACIC ECHOCARDIOGRAM  01-18-2017   dr hochrein   moderate LVH, ef 35-40%, diffuse hypokinesis/  trivial AR and MR/  mild TR  . TRANSURETHRAL RESECTION OF BLADDER TUMOR  10/07/2011   Procedure: TRANSURETHRAL RESECTION OF BLADDER TUMOR (TURBT);  Surgeon: Malka So, MD;  Location: [facility];  Service: Urology;  Laterality: N/A;  . TRANSURETHRAL RESECTION OF BLADDER TUMOR N/A 07/10/2015   Procedure: TRANSURETHRAL RESECTION OF BLADDER TUMOR (TURBT), CYSTOSCOPY ;  Surgeon: [name], MD;  Location: [facility];  Service: Urology;  Laterality: N/A;  . TRANSURETHRAL RESECTION OF PROSTATE  03/14/2012    Procedure: TRANSURETHRAL RESECTION OF THE PROSTATE WITH GYRUS INSTRUMENTS;  Surgeon: Malka So, MD;  Location: [facility];  Service: Urology;  Laterality: N/A;        Home Medications    Prior to Admission medications   Medication Sig Start Date End Date Taking? Authorizing Provider  albuterol (PROVENTIL) (2.5 MG/3ML) 0.083% nebulizer solution Take 3 mLs (2.5 mg total) by nebulization every 6 (six) hours as needed for wheezing or shortness of breath. 04/06/18  Yes Hawks, [name] A, FNP  COMBIVENT RESPIMAT 20-100 MCG/ACT AERS respimat 1 PUFF EVERY 4 HOURS AS NEEDED FOR COUGH, WHEEZING, OR SHORTNESS OF BREATH Patient taking differently: Inhale 1 puff into the lungs every 4 (four) hours as needed for wheezing or shortness of breath.  04/06/18  Yes Dettinger, Fransisca Kaufmann, MD  dextromethorphan-guaiFENesin [facility] DM) 30-600 MG 12hr tablet Take 1 tablet by mouth 2 (two) times daily for 10 days. 06/24/18 07/04/18 Yes Shah, Pratik D, DO  donepezil (ARICEPT) 10 MG tablet TAKE ONE TABLET AT BEDTIME Patient taking differently: Take 10 mg by mouth at bedtime.  03/17/18  Yes Dettinger, Fransisca Kaufmann, MD  febuxostat (ULORIC) 40 MG tablet TAKE 1 TABLET DAILY Patient taking differently: Take 40 mg by mouth every morning.  03/03/18  Yes Dettinger, Fransisca Kaufmann, MD  FEROSUL 325 (65 Fe) MG tablet Take 1 tablet (325 mg total) by mouth daily with breakfast. Patient taking differently: Take 325 mg by mouth daily with breakfast.  03/17/18  Yes Dettinger, Fransisca Kaufmann, MD  fluticasone (FLONASE) 50 MCG/ACT nasal spray Place 1 spray into both nostrils daily as needed for allergies or rhinitis. 11/25/16  Yes Hawks, [name] A, FNP  folic acid (FOLVITE) 1 MG tablet TAKE 1 TABLET DAILY Patient taking differently: Take 1 mg by mouth every morning.  03/03/18  Yes Dettinger, Fransisca Kaufmann, MD  furosemide (LASIX) 40 MG tablet Take 1 tablet (40 mg total) by mouth 2 (two) times daily. 05/02/18  Yes Dettinger, Fransisca Kaufmann, MD  gabapentin (NEURONTIN) 100 MG  capsule TAKE 2 CAPSULES TWICE A DAY Patient taking differently: Take 200 mg by mouth 2 (two) times daily.  05/25/18  Yes Dettinger, Fransisca Kaufmann, MD  isosorbide dinitrate (ISORDIL) 30 MG tablet TAKE 1 TABLET ONCE A DAY Patient taking differently: Take 30 mg by mouth every morning.  03/17/18  Yes Chipper Herb, MD  metoprolol succinate (TOPROL-XL) 50 MG 24 hr tablet Take 1 tablet (50 mg total) by mouth every morning. (Appt w/ PCP for blood pressure) 06/30/18  Yes Dettinger, Fransisca Kaufmann, MD  MYRBETRIQ 25 MG TB24 tablet Take 25 mg by mouth every morning.  09/20/16  Yes [provider]  nitroGLYCERIN (NITROSTAT) 0.4 MG SL tablet Place 1 tablet (0.4 mg total) under the tongue every 5 (five) minutes as needed for chest pain (up to 3 doses). 04/19/13  Yes Dunn, Dayna N, PA-C  pantoprazole (PROTONIX) 40 MG tablet TAKE 1 TABLET DAILY Patient taking differently: Take 40 mg by mouth every morning.  04/06/18  Yes Hawks, [name] A, FNP  sertraline (ZOLOFT) 50 MG tablet TAKE 1 TABLET IN THE MORNING Patient taking differently: Take 50 mg by mouth every morning.  06/21/18  Yes Dettinger, Fransisca Kaufmann, MD  simvastatin (ZOCOR) 10 MG tablet TAKE 1 TABLET ONCE DAILY IN THE EVENING Patient taking differently: Take 10 mg by mouth at bedtime.  06/08/18  Yes Dettinger, Fransisca Kaufmann, MD  warfarin (COUMADIN) 2.5 MG tablet Take 5 mg Friday March 29, then take 2.5 mg daily except 5 mg each Monday. Patient taking differently: Take 2.5-5 mg by mouth See admin instructions. Take 2.5 mg by mouth once daily except on Mondays and Fridays take 5 mg once daily 08/05/17  Yes Chipper Herb, MD  PROAIR HFA 108 [phone] Base) MCG/ACT inhaler 2 PUFFS EVERY 6 HOURS AS NEEDED FOR WHEEZING OR SHORTNESS OF BREATH Patient taking differently: Inhale 2 puffs into the lungs every 6 (six) hours as needed for wheezing or shortness of breath.  05/02/18   Dettinger, Fransisca Kaufmann, MD    Family History Family History  Problem Relation Age of Onset  . Hypertension  Mother   . Arthritis Mother   . Lung cancer Sister   . Deep vein thrombosis Father   . Early death Brother     Social History Social History   Tobacco Use  . Smoking status: Former Smoker    Packs/day: 1.00    Years: 35.00    Pack years: 35.00    Types: Cigarettes    Start date: 04/12/1942    Last attempt to quit: 04/13/1975    Years since quitting: 43.2  . Smokeless tobacco: Never Used  Substance Use Topics  . Alcohol use: Yes    Alcohol/week: 7.0 standard drinks    Types: 7 Cans of beer per week  . Drug use: No     Allergies   Lipitor [atorvastatin]   Review of Systems Review of Systems  Constitutional: Negative for chills and fever.  HENT: Positive for congestion. Negative for rhinorrhea and sore throat.   Eyes: Negative for visual disturbance.  Respiratory: Positive for cough. Negative for shortness of breath.   Cardiovascular: Negative for chest pain and leg swelling.  Gastrointestinal: Negative for abdominal pain, diarrhea, nausea and vomiting.  Genitourinary: Negative for dysuria.  Musculoskeletal: Negative for back pain and neck pain.  Skin: Negative for rash.  Neurological: Negative for dizziness, light-headedness and headaches.  Hematological: Does not bruise/bleed easily.  Psychiatric/Behavioral: Negative for confusion.     Physical Exam Updated Vital Signs BP (!) 119/57   Pulse 62   Temp 98.5 F (36.9 C) (Oral)   Resp 19   Ht 1.702 m (5\' 7" )   Wt 73.5 kg   SpO2 97%   BMI 25.37 kg/m   Physical Exam Vitals signs and nursing note reviewed.  Constitutional:      Appearance: Normal appearance. He is well-developed.  HENT:     Head: Normocephalic and atraumatic.     Nose: Congestion present.  Eyes:  Conjunctiva/sclera: Conjunctivae normal.  Neck:     Musculoskeletal: Normal range of motion and neck supple.  Cardiovascular:     Rate and Rhythm: Normal rate and regular rhythm.     Heart sounds: Normal heart sounds. No murmur.  Pulmonary:      Effort: Pulmonary effort is normal. No respiratory distress.     Breath sounds: Normal breath sounds. No wheezing, rhonchi or rales.  Abdominal:     General: Bowel sounds are normal.     Palpations: Abdomen is soft.     Tenderness: There is no abdominal tenderness.  Musculoskeletal: Normal range of motion.        General: Swelling present.     Comments: Left above-the-knee amputation  Skin:    General: Skin is warm and dry.  Neurological:     General: No focal deficit present.     Mental Status: He is alert and oriented to person, place, and time.      ED Treatments / Results  Labs (all labs ordered are listed, but only abnormal results are displayed) Labs Reviewed  COMPREHENSIVE METABOLIC PANEL - Abnormal; Notable for the following components:      Result Value   Potassium 3.4 (*)    Glucose, Bld 104 (*)    BUN 26 (*)    Creatinine, Ser 1.40 (*)    Calcium 8.8 (*)    Total Protein 6.1 (*)    Albumin 3.3 (*)    GFR calc non Af Amer 43 (*)    GFR calc Af Amer 49 (*)    All other components within normal limits  BRAIN NATRIURETIC PEPTIDE - Abnormal; Notable for the following components:   B Natriuretic Peptide 1,141.0 (*)    All other components within normal limits  CBC WITH DIFFERENTIAL/PLATELET  LACTIC ACID, PLASMA    EKG None  Radiology Dg Chest 2 View  Result Date: 07/04/2018 CLINICAL DATA:  Cough. EXAM: CHEST - 2 VIEW COMPARISON:  Radiographs of June 22, 2018. FINDINGS: Stable cardiomegaly with central pulmonary vascular congestion. Atherosclerosis of thoracic aorta is noted. No pneumothorax or significant pleural effusion is noted. No acute pulmonary disease is noted. Bony thorax is unremarkable. IMPRESSION: Stable cardiomegaly with central pulmonary vascular congestion. No acute abnormality is noted. Aortic Atherosclerosis (ICD10-I70.0). Electronically Signed   By: Marijo Conception, M.D.   On: 07/04/2018 15:43    Procedures Procedures (including critical  care time)  Medications Ordered in ED Medications  0.9 %  sodium chloride infusion ( Intravenous Rate/Dose Change 07/04/18 1615)  sodium chloride 0.9 % bolus 500 mL (0 mLs Intravenous Stopped 07/04/18 1615)  iohexol (OMNIPAQUE) 300 MG/ML solution 100 mL (75 mLs Intravenous Contrast Given 07/04/18 1729)     Initial Impression / Assessment and Plan / ED Course  I have reviewed the triage vital signs and the nursing notes.  Pertinent labs & imaging results that were available during my care of the patient were reviewed by me and considered in my medical decision making (see chart for details).        Patient with no direct coronavirus exposure or concerns.  Patient is also had this cough now at least since prior to March 12.  Chest x-ray here today without evidence of any pneumonia.  Based on the persistent cough CT chest has been ordered for closer evaluation.  If negative for any acute finding requiring admission patient can be discharged home with recommend another 5-day course of prednisone.  Patient does have a known history  of CHF.  BNP is elevated but is not as elevated as it has been in the past.  Patient's renal function is also somewhat improved compared to the last admission.  Patient was placed on 3 L of oxygen did have initial oxygen sats around 91%.  Once CTs scan findings are known.  Patient would need to be evaluated on room air.  Because he does not have home oxygen.  Patient is a DNR.   We will see what we get on CT scan if it shows significant CHF that may be or indication for admission.  And if patient tolerates room air fine patient could be discharged home.  Obviously if he has hypoxia he does not have oxygen at home so he would require admission.   Patient is alert is verbal.  Does have family support and does have home health care.  So patient in therapy could be discharged home.    Final Clinical Impressions(s) / ED Diagnoses   Final diagnoses:  Bronchitis     ED Discharge Orders    None       Fredia Sorrow, MD 07/04/18 1753    Fredia Sorrow, MD 07/04/18 1755

## 2018-07-04 NOTE — ED Notes (Signed)
Pt placed back on 2L

## 2018-07-04 NOTE — ED Provider Notes (Signed)
7:05 PM-checkout from Dr. Rogene Houston to evaluate patient after CT imaging to consider discharge.  Patient had presented with concern for persistent and worsening cough.  Symptoms may have worsened following cessation of treatment with prednisone, post hospital discharge.  He also was recently treated with cefdinir.  No other known sick contacts.  The patient has a new complaint of sore gums.  He has a small amount of what appears to be thrush on the right lower gumline.  Nystatin will be sent to his pharmacy.  At this time I discussed all the findings and ED course with the patient's son.  The son was concerned the patient has been somewhat agitated, since arrival here, but now he is better from that perspective.  The son was with the patient during the period of time his oxygen was removed about 25 minutes ago.  While the oxygen saturation was 87%, the patient was felt to be near his baseline from a respiratory status, by the son.  At this time the patient is desires to go home.  I had a long discussion with the son, regarding findings, and treatment options.  They are agreeable to going home.   Dg Chest 2 View  Result Date: 07/04/2018 CLINICAL DATA:  Cough. EXAM: CHEST - 2 VIEW COMPARISON:  Radiographs of June 22, 2018. FINDINGS: Stable cardiomegaly with central pulmonary vascular congestion. Atherosclerosis of thoracic aorta is noted. No pneumothorax or significant pleural effusion is noted. No acute pulmonary disease is noted. Bony thorax is unremarkable. IMPRESSION: Stable cardiomegaly with central pulmonary vascular congestion. No acute abnormality is noted. Aortic Atherosclerosis (ICD10-I70.0). Electronically Signed   By: Marijo Conception, M.D.   On: 07/04/2018 15:43   Ct Chest W Contrast  Result Date: 07/04/2018 CLINICAL DATA:  Increasing cough and shortness of breath. EXAM: CT CHEST WITH CONTRAST TECHNIQUE: Multidetector CT imaging of the chest was performed during intravenous contrast  administration. CONTRAST:  68mL OMNIPAQUE IOHEXOL 300 MG/ML  SOLN COMPARISON:  Chest radiograph 07/04/2018 FINDINGS: Cardiovascular: No significant vascular findings. Enlarged heart size. No pericardial effusion. Calcific atherosclerotic disease of the coronary arteries and aorta. Mediastinum/Nodes: No enlarged mediastinal, hilar, or axillary lymph nodes. Thyroid gland, and trachea demonstrate no significant findings. Diffuse dilation of the esophagus with air-fluid level. Lungs/Pleura: Mild subpleural airspace consolidation versus atelectasis in bilateral lung bases, right greater than left. No evidence of pulmonary edema or pericardial effusion. Upper Abdomen: No acute abnormality. Musculoskeletal: Compression fracture of T8 inferior endplate with approximately 40% height loss and subsequent kyphosis. Osteolytic changes of T12 vertebral body without height loss. IMPRESSION: 1. Enlarged heart. 2. Calcific atherosclerotic disease of the coronary arteries and aorta. 3. Diffuse dilation of the esophagus with air-fluid level. Query esophageal dysmotility. 4. Subpleural airspace consolidation versus atelectasis in bilateral lung bases, right greater than left. No evidence of pulmonary edema. 5. T8 compression fracture, likely chronic. 6. Osteolytic changes of T12 vertebral body without height loss or cortical disruption. This may represent a osseous hemangioma or osseous lytic metastatic disease. Aortic Atherosclerosis (ICD10-I70.0). Electronically Signed   By: Fidela Salisbury M.D.   On: 07/04/2018 18:54    Patient Vitals for the past 24 hrs:  BP Temp Temp src Pulse Resp SpO2 Height Weight  07/04/18 1930 (!) 166/91 - - 66 (!) 21 97 % - -  07/04/18 1900 (!) 157/86 - - (!) 59 15 98 % - -  07/04/18 1800 (!) 148/79 - - 65 17 100 % - -  07/04/18 1530 (!) 119/57 - -  62 19 97 % - -  07/04/18 1502 125/70 - - (!) 58 (!) 22 98 % - -  07/04/18 1430 118/76 - - - 17 - - -  07/04/18 1400 106/69 - - - 14 - - -   07/04/18 1345 - - - (!) 57 (!) 23 100 % - -  07/04/18 1330 107/60 - - 60 (!) 23 99 % - -  07/04/18 1316 - - - - - - 5\' 7"  (1.702 m) 73.5 kg  07/04/18 1315 (!) 105/54 98.5 F (36.9 C) Oral 65 18 91 % - -     Medical Decision Making: Evaluation consistent with recurrent bronchitis and likely COPD exacerbation.  Recently treated with antibiotic without signs of bacterial infection at this time.  Mild hypoxia, without significant symptoms, requiring hospitalization at this time.  Patient is on IV choice for charge to home with outpatient follow-up.  Prescription for prednisone sent to his pharmacy.  He has bronchodilators to use at home.   CRITICAL CARE-no Performed by: Daleen Bo  Nursing Notes Reviewed/ Care Coordinated Applicable Imaging Reviewed Interpretation of Laboratory Data incorporated into ED treatment  The patient appears reasonably screened and/or stabilized for discharge and I doubt any other medical condition or other [facility] requiring further screening, evaluation, or treatment in the ED at this time prior to discharge.  Plan: Home Medications-prescription for prednisone and nystatin sent to his pharmacy, continue usual; Home Treatments-rest, fluids; return here if the recommended treatment, does not improve the symptoms; Recommended follow up-ECP checkup 1 week.     Daleen Bo, MD 07/04/18 [phone]

## 2018-07-04 NOTE — Patient Instructions (Signed)
Pt instructed to go to ED for further evaluation. Unable to bring into office for physical examination and chest xray due to COVID-19.

## 2018-07-04 NOTE — Discharge Instructions (Addendum)
The testing indicates that he continues to have bronchitis, likely viral.  We are placing him back on prednisone, now for 1 week.  A prescription was sent to his pharmacy.  Additionally, for trouble breathing, use albuterol every 3 or 4 hours, either inhaler or nebulizer.  We sent a prescription for nystatin to the pharmacy to treat thrush in his mouth.  Continue his other medications as usual.  Encourage him to drink plenty of water.  Monitor his oxygen saturation and watch for worsening respiratory trouble.  If the saturation drops below 85%, and/or, he is uncomfortable, return to the emergency department.  Call his doctor to discuss follow-up care and treatment, prior to completing the course of prednisone which has been sent to his pharmacy.  At that time decisions can be made about ongoing management.  As we discussed, the CT scan showed some additional things to be aware of and talk to his PCP about.  They will be able to see the report.  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  You should also ask his urologist about the possibility that the bladder cancer has gone to the vertebrae.

## 2018-07-04 NOTE — ED Triage Notes (Addendum)
Pt was seen her last week with viral bronchitis.  Now presents with same symptoms but worsening cough and sob.   PCP sent pt to ER to be evaluated   Pt's son was out of state 3 weeks ago attending a convention.  Pt's son states that people at the convention were tested positive for COVID-19

## 2018-07-04 NOTE — ED Notes (Signed)
Pt's son standing in between curtain and door tapping on glass multiple times to get staff member's attention. States patient's conditioning is worsening and staff need to intervene. Informed visitor that pt was in same condition as when he came it, pt is on cardiac monitor, and EDP is aware pt is here.

## 2018-07-04 NOTE — Telephone Encounter (Signed)
Spoke with patient's son [name].  He states patient was in the hospital recently for UTI and bronchitis.  He states at the end of last week his father's cough was markedly improved, and he was feeling better.  For the past 2 days [name] and patient's caregiver have noticed his cough worsening, - sounds deeper and more frequent.  His oxygen saturations have been 93-94% when checked.  Patient states he feels worse than when he went to the ER 06/22/2018.  [name] denies his father having fever, but does have some intermittent shortness of breath.  Offered [name] a virtual OV for his father, he is agreeable.  Advised him that [name], NP will call them at 12:05 pm.

## 2018-07-05 ENCOUNTER — Other Ambulatory Visit: Payer: Self-pay | Admitting: Family Medicine

## 2018-07-05 ENCOUNTER — Telehealth: Payer: Self-pay | Admitting: Family Medicine

## 2018-07-05 DIAGNOSIS — R059 Cough, unspecified: Secondary | ICD-10-CM

## 2018-07-05 DIAGNOSIS — R05 Cough: Secondary | ICD-10-CM

## 2018-07-05 MED ORDER — BENZONATATE 100 MG PO CAPS: 100.0000 mg | ORAL_CAPSULE | Freq: Three times a day (TID) | ORAL | 0 refills | Status: DC | PRN

## 2018-07-05 NOTE — Telephone Encounter (Signed)
Will forward to [name], FNP to advise.

## 2018-07-05 NOTE — Telephone Encounter (Signed)
Patient's son notified.

## 2018-07-05 NOTE — Telephone Encounter (Signed)
Sent tessalon to [facility].

## 2018-07-11 ENCOUNTER — Other Ambulatory Visit: Payer: Self-pay | Admitting: Family Medicine

## 2018-07-29 ENCOUNTER — Other Ambulatory Visit: Payer: Self-pay | Admitting: Family Medicine

## 2018-07-31 ENCOUNTER — Telehealth: Payer: Self-pay | Admitting: Family Medicine

## 2018-07-31 ENCOUNTER — Other Ambulatory Visit: Payer: Self-pay

## 2018-07-31 MED ORDER — METOPROLOL SUCCINATE ER 50 MG PO TB24: 50.0000 mg | ORAL_TABLET | Freq: Every morning | ORAL | 0 refills | Status: DC

## 2018-07-31 NOTE — Telephone Encounter (Signed)
Go ahead and send in a months worth of the medication and then have him follow-up with me with a phone visit and check his blood pressure at home if possible prior to that visit in 4 weeks

## 2018-07-31 NOTE — Telephone Encounter (Signed)
Dettinger. NTBS w/ PCP 30 days given 06/30/18 & hosp [facility]

## 2018-07-31 NOTE — Telephone Encounter (Signed)
Apt scheduled.  

## 2018-07-31 NOTE — Telephone Encounter (Signed)
Spoke with caregiver and advised that we would send in 1 month and to follow up in 4 weeks via telephone. Also advised to check BP at home if possible. Caregiver verbalized understanding

## 2018-07-31 NOTE — Telephone Encounter (Signed)
Rx was denied for metoprolol because it says he needs follow up with PCP. Do you want him to come to the office for a BP check or call on phone?

## 2018-07-31 NOTE — Telephone Encounter (Signed)
Spoke with caregiver and advised that we would send in 1 month and for them to follow up via telephone in 4 weeks. Also advised to check BP at home if able to. Caregiver verbalized understanding

## 2018-08-01 ENCOUNTER — Other Ambulatory Visit: Payer: Self-pay | Admitting: Family Medicine

## 2018-08-02 ENCOUNTER — Other Ambulatory Visit: Payer: Self-pay

## 2018-08-02 ENCOUNTER — Ambulatory Visit (INDEPENDENT_AMBULATORY_CARE_PROVIDER_SITE_OTHER): Payer: Medicare Other | Admitting: Family Medicine

## 2018-08-02 ENCOUNTER — Encounter: Payer: Self-pay | Admitting: Family Medicine

## 2018-08-02 DIAGNOSIS — I4891 Unspecified atrial fibrillation: Secondary | ICD-10-CM

## 2018-08-02 DIAGNOSIS — E782 Mixed hyperlipidemia: Secondary | ICD-10-CM

## 2018-08-02 DIAGNOSIS — I10 Essential (primary) hypertension: Secondary | ICD-10-CM

## 2018-08-02 DIAGNOSIS — N183 Chronic kidney disease, stage 3 (moderate): Secondary | ICD-10-CM | POA: Diagnosis not present

## 2018-08-02 DIAGNOSIS — N1831 Chronic kidney disease, stage 3a: Secondary | ICD-10-CM

## 2018-08-02 MED ORDER — METOPROLOL SUCCINATE ER 50 MG PO TB24: 50.0000 mg | ORAL_TABLET | Freq: Every morning | ORAL | 3 refills | Status: DC

## 2018-08-02 NOTE — Progress Notes (Signed)
Virtual Visit via telephone Note  I connected with Scott [name] on 08/02/18 at 1338 by telephone and verified that I am speaking with the correct person using two identifiers. [name] Deist is currently located at home and long term care giver are currently with her during visit. The provider, Fransisca Kaufmann Greenleigh Kauth, MD is located in their office at time of visit.  Call ended at 1355  I discussed the limitations, risks, security and privacy concerns of performing an evaluation and management service by telephone and the availability of in person appointments. I also discussed with the patient that there may be a patient responsible charge related to this service. The patient expressed understanding and agreed to proceed.   History and Present Illness: Hypertension Patient is currently on Isordil and metoprolol, and their blood pressure today is unknown because they do have a blood pressure cuff but they are working on obtaining 1. Patient denies any lightheadedness or dizziness. Patient denies headaches, blurred vision, chest pains, shortness of breath, or weakness. Denies any side effects from medication and is content with current medication.   Hyperlipidemia Patient is coming in for recheck of his hyperlipidemia. The patient is currently taking simvastatin. They deny any issues with myalgias or history of liver damage from it. They deny any focal numbness or weakness or chest pain.   afib and coumadin Will try to get home Coumadin checks but unable to check today and trying to keep patient out of the office as much as we can, last INR was 1 month ago and it was 2.5 which was good.  He denies any bruising or bleeding episodes.  chf  Has a little swelling starting in the legs but denies any shortness of breath, if patient only has 1 legs was unable to weigh himself on a scale.  Patient does have CKD and has been stable up to this point.  We will recheck it once coronavirus is over and can  have him come in for labs.  No diagnosis found.  Outpatient Encounter Medications as of 08/02/2018  Medication Sig  . albuterol (PROVENTIL) (2.5 MG/3ML) 0.083% nebulizer solution Take 3 mLs (2.5 mg total) by nebulization every 6 (six) hours as needed for wheezing or shortness of breath.  . benzonatate (TESSALON PERLES) 100 MG capsule Take 1 capsule (100 mg total) by mouth 3 (three) times daily as needed.  . COMBIVENT RESPIMAT 20-100 MCG/ACT AERS respimat 1 PUFF EVERY 4 HOURS AS NEEDED FOR COUGH, WHEEZING, OR SHORTNESS OF BREATH (Patient taking differently: Inhale 1 puff into the lungs every 4 (four) hours as needed for wheezing or shortness of breath. )  . donepezil (ARICEPT) 10 MG tablet TAKE ONE TABLET AT BEDTIME (Patient taking differently: Take 10 mg by mouth at bedtime. )  . febuxostat (ULORIC) 40 MG tablet TAKE 1 TABLET DAILY (Patient taking differently: Take 40 mg by mouth every morning. )  . ferrous sulfate (FEROSUL) 325 (65 FE) MG tablet TAKE (1) TABLET DAILY WITH BREAKFAST.  . fluticasone (FLONASE) 50 MCG/ACT nasal spray Place 1 spray into both nostrils daily as needed for allergies or rhinitis.  . folic acid (FOLVITE) 1 MG tablet TAKE 1 TABLET DAILY (Patient taking differently: Take 1 mg by mouth every morning. )  . furosemide (LASIX) 40 MG tablet Take 1 tablet (40 mg total) by mouth 2 (two) times daily.  Marland Kitchen gabapentin (NEURONTIN) 100 MG capsule TAKE 2 CAPSULES TWICE A DAY  . isosorbide dinitrate (ISORDIL) 30 MG tablet TAKE 1  TABLET ONCE A DAY (Patient taking differently: Take 30 mg by mouth every morning. )  . metoprolol succinate (TOPROL-XL) 50 MG 24 hr tablet Take 1 tablet (50 mg total) by mouth every morning. (Appt w/ PCP for blood pressure)  . MYRBETRIQ 25 MG TB24 tablet Take 25 mg by mouth every morning.   . nitroGLYCERIN (NITROSTAT) 0.4 MG SL tablet Place 1 tablet (0.4 mg total) under the tongue every 5 (five) minutes as needed for chest pain (up to 3 doses).  . nystatin  (MYCOSTATIN) 100000 UNIT/ML suspension Take 5 mLs (500,000 Units total) by mouth 4 (four) times daily. Swish in mouth before swallowing  . pantoprazole (PROTONIX) 40 MG tablet TAKE 1 TABLET DAILY (Patient taking differently: Take 40 mg by mouth every morning. )  . predniSONE (DELTASONE) 10 MG tablet Take q D 6,5,4,3,2,1  . PROAIR HFA 108 (90 Base) MCG/ACT inhaler 2 PUFFS EVERY 6 HOURS AS NEEDED FOR WHEEZING OR SHORTNESS OF BREATH (Patient taking differently: Inhale 2 puffs into the lungs every 6 (six) hours as needed for wheezing or shortness of breath. )  . sertraline (ZOLOFT) 50 MG tablet TAKE 1 TABLET IN THE MORNING  . simvastatin (ZOCOR) 10 MG tablet TAKE 1 TABLET ONCE DAILY IN THE EVENING (Patient taking differently: Take 10 mg by mouth at bedtime. )  . warfarin (COUMADIN) 2.5 MG tablet Take 5 mg Friday March 29, then take 2.5 mg daily except 5 mg each Monday. (Patient taking differently: Take 2.5-5 mg by mouth See admin instructions. Take 2.5 mg by mouth once daily except on Mondays and Fridays take 5 mg once daily)   No facility-administered encounter medications on file as of 08/02/2018.     Review of Systems  Constitutional: Negative for chills and fever.  Eyes: Negative for visual disturbance.  Respiratory: Negative for cough, shortness of breath and wheezing.   Cardiovascular: Positive for leg swelling. Negative for chest pain.  Gastrointestinal: Negative for blood in stool.  Genitourinary: Negative for hematuria.  Musculoskeletal: Negative for back pain and gait problem.  Skin: Negative for rash.  Neurological: Negative for dizziness, weakness and light-headedness.  All other systems reviewed and are negative.   Observations/Objective: Patient sounds comfortable and in no acute distress  Assessment and Plan: Problem List Items Addressed This Visit      Cardiovascular and Mediastinum   Atrial fibrillation with RVR (HCC)   Relevant Medications   metoprolol succinate  (TOPROL-XL) 50 MG 24 hr tablet   Essential hypertension   Relevant Medications   metoprolol succinate (TOPROL-XL) 50 MG 24 hr tablet     Genitourinary   Chronic kidney disease (CKD) stage G3a/A1, moderately decreased glomerular filtration rate (GFR) between 45-59 mL/min/1.73 square meter and albuminuria creatinine ratio less than 30 mg/g (HCC)     Other   Hyperlipidemia - Primary (Chronic)   Relevant Medications   metoprolol succinate (TOPROL-XL) 50 MG 24 hr tablet       Follow Up Instructions: Follow-up in 4 weeks if he is unable to get Coumadin recheck at home, if not follow-up in 2 months    I discussed the assessment and treatment plan with the patient. The patient was provided an opportunity to ask questions and all were answered. The patient agreed with the plan and demonstrated an understanding of the instructions.   The patient was advised to call back or seek an in-person evaluation if the symptoms worsen or if the condition fails to improve as anticipated.  The above assessment and management  plan was discussed with the patient. The patient verbalized understanding of and has agreed to the management plan. Patient is aware to call the clinic if symptoms persist or worsen. Patient is aware when to return to the clinic for a follow-up visit. Patient educated on when it is appropriate to go to the emergency department.    I provided 17 minutes of non-face-to-face time during this encounter.    Worthy Rancher, MD

## 2018-08-04 ENCOUNTER — Other Ambulatory Visit: Payer: Self-pay | Admitting: Family Medicine

## 2018-08-19 ENCOUNTER — Other Ambulatory Visit: Payer: Self-pay | Admitting: *Deleted

## 2018-08-19 MED ORDER — WARFARIN SODIUM 2.5 MG PO TABS: ORAL_TABLET | ORAL | 1 refills | Status: DC

## 2018-08-23 ENCOUNTER — Other Ambulatory Visit: Payer: Self-pay | Admitting: Family Medicine

## 2018-08-31 ENCOUNTER — Ambulatory Visit: Payer: Medicare Other | Admitting: Family Medicine

## 2018-09-01 ENCOUNTER — Other Ambulatory Visit: Payer: Self-pay | Admitting: Family Medicine

## 2018-09-02 ENCOUNTER — Other Ambulatory Visit: Payer: Self-pay | Admitting: Family

## 2018-09-02 ENCOUNTER — Other Ambulatory Visit: Payer: Self-pay | Admitting: Family Medicine

## 2018-09-06 ENCOUNTER — Other Ambulatory Visit: Payer: Self-pay

## 2018-09-07 ENCOUNTER — Encounter: Payer: Self-pay | Admitting: Family Medicine

## 2018-09-07 ENCOUNTER — Ambulatory Visit (INDEPENDENT_AMBULATORY_CARE_PROVIDER_SITE_OTHER): Payer: Medicare Other

## 2018-09-07 ENCOUNTER — Ambulatory Visit (INDEPENDENT_AMBULATORY_CARE_PROVIDER_SITE_OTHER): Payer: Medicare Other | Admitting: Family Medicine

## 2018-09-07 VITALS — BP 151/77 | HR 56 | Temp 98.0°F

## 2018-09-07 DIAGNOSIS — Z86711 Personal history of pulmonary embolism: Secondary | ICD-10-CM

## 2018-09-07 DIAGNOSIS — R0989 Other specified symptoms and signs involving the circulatory and respiratory systems: Secondary | ICD-10-CM

## 2018-09-07 DIAGNOSIS — N1831 Chronic kidney disease, stage 3a: Secondary | ICD-10-CM

## 2018-09-07 DIAGNOSIS — B351 Tinea unguium: Secondary | ICD-10-CM | POA: Diagnosis not present

## 2018-09-07 DIAGNOSIS — G301 Alzheimer's disease with late onset: Secondary | ICD-10-CM

## 2018-09-07 DIAGNOSIS — I4891 Unspecified atrial fibrillation: Secondary | ICD-10-CM | POA: Diagnosis not present

## 2018-09-07 DIAGNOSIS — F0280 Dementia in other diseases classified elsewhere without behavioral disturbance: Secondary | ICD-10-CM

## 2018-09-07 DIAGNOSIS — N183 Chronic kidney disease, stage 3 (moderate): Secondary | ICD-10-CM

## 2018-09-07 DIAGNOSIS — M79676 Pain in unspecified toe(s): Secondary | ICD-10-CM | POA: Diagnosis not present

## 2018-09-07 LAB — COAGUCHEK XS/INR WAIVED
INR: 2.6 — ABNORMAL HIGH (ref 0.9–1.1)
Prothrombin Time: 26.6 s

## 2018-09-07 MED ORDER — FLUTICASONE PROPIONATE 50 MCG/ACT NA SUSP: 1.0000 | Freq: Every day | NASAL | 2 refills | Status: DC | PRN

## 2018-09-07 MED ORDER — ALBUTEROL SULFATE (2.5 MG/3ML) 0.083% IN NEBU: INHALATION_SOLUTION | RESPIRATORY_TRACT | 3 refills | Status: DC

## 2018-09-07 MED ORDER — MEMANTINE HCL 28 X 5 MG & 21 X 10 MG PO TABS: ORAL_TABLET | ORAL | 12 refills | Status: DC

## 2018-09-07 NOTE — Progress Notes (Signed)
SpO2 92%    Subjective:   Patient ID: Scott [name], male    DOB: [birth year], 85 y.o.   MRN: [id number]  HPI: Scott [name] is a 85 y.o. male presenting on 09/07/2018 for Coagulation Disorder (Son states that he has noticed his breathing has been more labored in the last few weeks); Memory Loss (Son states it has been getting worse x 7-8 weeks); and sleeping a lot (x 2-3 weeks)   HPI Memory concerns and dementia She is coming in with family discussed that he has been having more memory loss and they have been noticing that over the past 7 to 8 weeks.  Patient has been on Aricept and they feel like he is gradually getting worse over the past couple months.  They are also concerned that he has been having some more issues remembering tasks and forgetting simple tasks around the house.  They said he is also been increasing on his sleep recently and are wondering if that is related to the memory or not or what could be going on.  They deny him having any major mood changes that they have noticed or behavioral issues.  They deny him having any recent focal numbness or weakness changes or complaints of headaches. MMSE - Mini Mental State Exam 06/03/2014  Orientation to time 5  Orientation to Place 4  Registration 3  Attention/ Calculation 4  Recall 2  Language- name 2 objects 2  Language- repeat 1  Language- follow 3 step command 3  Language- read & follow direction 1  Write a sentence 1  Copy design 0  Total score 26    Patient is coming in for recheck of CKD and blood work.  Patient is also coming in today for recheck of INR and see where his Coumadin is.  He does have a history of pulmonary embolism and they think he has been little more labored breathing on the past couple weeks but he seems to be better today.  They are using his inhalers as well.  He denies any chest pain today.  He is here today with a caretaker  Relevant past medical, surgical, family and social history reviewed and  updated as indicated. Interim medical history since our last visit reviewed. Allergies and medications reviewed and updated.  Review of Systems  Constitutional: Negative for chills and fever.  HENT: Negative for congestion.   Eyes: Negative for visual disturbance.  Respiratory: Negative for cough, shortness of breath and wheezing.   Cardiovascular: Negative for chest pain and leg swelling.  Gastrointestinal: Negative for blood in stool.  Genitourinary: Negative for hematuria.  Musculoskeletal: Negative for back pain and gait problem.  Skin: Negative for rash.  Psychiatric/Behavioral: Positive for confusion and decreased concentration. Negative for dysphoric mood, self-injury, sleep disturbance and suicidal ideas. The patient is not nervous/anxious.   All other systems reviewed and are negative.   Per HPI unless specifically indicated above   Allergies as of 09/07/2018      Reactions   Lipitor [atorvastatin] Other (See Comments)   Unknown rxn per pt      Medication List       Accurate as of Sep 07, 2018 11:11 AM. If you have any questions, ask your nurse or doctor.        Combivent Respimat 20-100 MCG/ACT Aers respimat Generic drug:  Ipratropium-Albuterol 1 PUFF EVERY 4 HOURS AS NEEDED FOR COUGH, WHEEZING, OR SHORTNESS OF BREATH   donepezil 10 MG tablet Commonly known as:  ARICEPT Take 1 tablet (10 mg total) by mouth at bedtime.   febuxostat 40 MG tablet Commonly known as:  ULORIC TAKE 1 TABLET DAILY   ferrous sulfate 325 (65 FE) MG tablet TAKE (1) TABLET DAILY WITH BREAKFAST.   fluticasone 50 MCG/ACT nasal spray Commonly known as:  FLONASE Place 1 spray into both nostrils daily as needed for allergies or rhinitis.   folic acid 1 MG tablet Commonly known as:  FOLVITE TAKE 1 TABLET DAILY   furosemide 40 MG tablet Commonly known as:  LASIX Take 1 tablet (40 mg total) by mouth 2 (two) times daily.   gabapentin 100 MG capsule Commonly known as:  NEURONTIN TAKE  2 CAPSULES TWICE A DAY   isosorbide dinitrate 30 MG tablet Commonly known as:  ISORDIL TAKE 1 TABLET ONCE A DAY What changed:  when to take this   metoprolol succinate 50 MG 24 hr tablet Commonly known as:  TOPROL-XL Take 1 tablet (50 mg total) by mouth every morning. (Appt w/ PCP for blood pressure)   Myrbetriq 25 MG Tb24 tablet Generic drug:  mirabegron ER Take 25 mg by mouth every morning.   nitroGLYCERIN 0.4 MG SL tablet Commonly known as:  NITROSTAT Place 1 tablet (0.4 mg total) under the tongue every 5 (five) minutes as needed for chest pain (up to 3 doses).   pantoprazole 40 MG tablet Commonly known as:  PROTONIX TAKE 1 TABLET DAILY What changed:  when to take this   ProAir HFA 108 (90 Base) MCG/ACT inhaler Generic drug:  albuterol 2 PUFFS EVERY 6 HOURS AS NEEDED FOR WHEEZING OR SHORTNESS OF BREATH What changed:  See the new instructions.   albuterol (2.5 MG/3ML) 0.083% nebulizer solution Commonly known as:  PROVENTIL NEBULIZE 1 VIAL EVERY 6 HOURS AS NEEDED FOR WHEEZING OR SHORTNESS OF BREATH What changed:  Another medication with the same name was changed. Make sure you understand how and when to take each.   sertraline 50 MG tablet Commonly known as:  ZOLOFT TAKE 1 TABLET IN THE MORNING   simvastatin 10 MG tablet Commonly known as:  ZOCOR TAKE 1 TABLET ONCE DAILY IN THE EVENING   warfarin 2.5 MG tablet Commonly known as:  COUMADIN Take as directed by the anticoagulation clinic. If you are unsure how to take this medication, talk to your nurse or doctor. Original instructions:  Take 2.5 mg by mouth daily except 5 mg on Mondays and Fridays        Objective:   SpO2 92%   Wt Readings from Last 3 Encounters:  07/04/18 162 lb (73.5 kg)  06/24/18 162 lb 14.7 oz (73.9 kg)  06/05/18 160 lb (72.6 kg)    Physical Exam Vitals signs and nursing note reviewed.  Constitutional:      General: He is not in acute distress.    Appearance: He is well-developed. He  is not diaphoretic.  Eyes:     General: No scleral icterus.    Conjunctiva/sclera: Conjunctivae normal.  Neck:     Musculoskeletal: Neck supple.     Thyroid: No thyromegaly.  Cardiovascular:     Rate and Rhythm: Normal rate and regular rhythm.     Heart sounds: Normal heart sounds. No murmur.  Pulmonary:     Effort: Pulmonary effort is normal. No respiratory distress.     Breath sounds: Normal breath sounds. No wheezing.  Musculoskeletal: Normal range of motion.  Lymphadenopathy:     Cervical: No cervical adenopathy.  Skin:    General:  Skin is warm and dry.     Findings: No rash.  Neurological:     Mental Status: He is alert and oriented to person, place, and time.     Coordination: Coordination normal.  Psychiatric:        Behavior: Behavior normal.        Cognition and Memory: Memory is impaired. He exhibits impaired recent memory.        Assessment & Plan:   Problem List Items Addressed This Visit      Cardiovascular and Mediastinum   Atrial fibrillation with RVR ([place]) - Primary   Relevant Orders   CoaguChek XS/INR Waived (Completed)   CBC with Differential/Platelet (Completed)     Nervous and Auditory   Alzheimer disease ([place])   Relevant Medications   memantine (NAMENDA TITRATION PAK) tablet pack     Genitourinary   Chronic kidney disease (CKD) stage G3a/A1, moderately decreased glomerular filtration rate (GFR) between 45-59 mL/min/1.73 square meter and albuminuria creatinine ratio less than 30 mg/g (HCC)   Relevant Orders   CBC with Differential/Platelet (Completed)   CMP14+EGFR (Completed)     Other   History of pulmonary embolism (2014)    Other Visit Diagnoses    Chest congestion       Relevant Orders   DG Chest 2 View (Completed)      Description   Continue taking 5 mg(2 pills) on Mondays and Fridays and 2.5 mg (1 pill) the rest of the week  INR today is 2.2  (goal is 2-3)  Perfect reading today     Will start Namenda and do blood work, will  also do chest x-ray because of congestion.  We discussed that he has mild impairment that is more even despite Aricept so we will add the Namenda.  Chest x-ray does not show any major abnormalities   Follow up plan: Return in about 4 weeks (around 10/05/2018), or if symptoms worsen or fail to improve, for inr recheck.  Counseling provided for all of the vaccine components Orders Placed This Encounter  Procedures  . CoaguChek XS/INR [place], MD [facility] 09/07/2018, 11:11 AM

## 2018-09-08 LAB — CMP14+EGFR
ALT: 10 IU/L (ref 0–44)
AST: 20 IU/L (ref 0–40)
Albumin/Globulin Ratio: 1.6 (ref 1.2–2.2)
Albumin: 4.1 g/dL (ref 3.5–4.6)
Alkaline Phosphatase: 180 IU/L — ABNORMAL HIGH (ref 39–117)
BUN/Creatinine Ratio: 16 (ref 10–24)
BUN: 23 mg/dL (ref 10–36)
Bilirubin Total: 0.4 mg/dL (ref 0.0–1.2)
CO2: 28 mmol/L (ref 20–29)
Calcium: 9.7 mg/dL (ref 8.6–10.2)
Chloride: 103 mmol/L (ref 96–106)
Creatinine, Ser: 1.42 mg/dL — ABNORMAL HIGH (ref 0.76–1.27)
GFR calc Af Amer: 49 mL/min/{1.73_m2} — ABNORMAL LOW (ref >59)
GFR calc non Af Amer: 42 mL/min/{1.73_m2} — ABNORMAL LOW (ref >59)
Globulin, Total: 2.5 g/dL (ref 1.5–4.5)
Glucose: 105 mg/dL — ABNORMAL HIGH (ref 65–99)
Potassium: 4 mmol/L (ref 3.5–5.2)
Sodium: 145 mmol/L — ABNORMAL HIGH (ref 134–144)
Total Protein: 6.6 g/dL (ref 6.0–8.5)

## 2018-09-08 LAB — CBC WITH DIFFERENTIAL/PLATELET
Basophils Absolute: 0 10*3/uL (ref 0.0–0.2)
Basos: 1 %
EOS (ABSOLUTE): 0 10*3/uL (ref 0.0–0.4)
Eos: 1 %
Hematocrit: 42.2 % (ref 37.5–51.0)
Hemoglobin: 13.6 g/dL (ref 13.0–17.7)
Immature Grans (Abs): 0 10*3/uL (ref 0.0–0.1)
Immature Granulocytes: 0 %
Lymphocytes Absolute: 1.3 10*3/uL (ref 0.7–3.1)
Lymphs: 36 %
MCH: 27.7 pg (ref 26.6–33.0)
MCHC: 32.2 g/dL (ref 31.5–35.7)
MCV: 86 fL (ref 79–97)
Monocytes Absolute: 0.4 10*3/uL (ref 0.1–0.9)
Monocytes: 10 %
Neutrophils Absolute: 1.9 10*3/uL (ref 1.4–7.0)
Neutrophils: 52 %
Platelets: 167 10*3/uL (ref 150–450)
RBC: 4.91 x10E6/uL (ref 4.14–5.80)
RDW: 15.3 % (ref 11.6–15.4)
WBC: 3.7 10*3/uL (ref 3.4–10.8)

## 2018-09-19 ENCOUNTER — Other Ambulatory Visit: Payer: Self-pay | Admitting: Family Medicine

## 2018-09-19 DIAGNOSIS — I5042 Chronic combined systolic (congestive) and diastolic (congestive) heart failure: Secondary | ICD-10-CM

## 2018-09-22 ENCOUNTER — Ambulatory Visit (INDEPENDENT_AMBULATORY_CARE_PROVIDER_SITE_OTHER): Payer: Medicare Other | Admitting: Urology

## 2018-09-22 DIAGNOSIS — N3941 Urge incontinence: Secondary | ICD-10-CM

## 2018-09-22 DIAGNOSIS — C675 Malignant neoplasm of bladder neck: Secondary | ICD-10-CM

## 2018-10-10 ENCOUNTER — Other Ambulatory Visit: Payer: Self-pay | Admitting: Family Medicine

## 2018-10-11 ENCOUNTER — Other Ambulatory Visit: Payer: Self-pay | Admitting: *Deleted

## 2018-10-11 MED ORDER — WARFARIN SODIUM 2.5 MG PO TABS: 2.5000 mg | ORAL_TABLET | ORAL | 3 refills | Status: DC

## 2018-10-12 ENCOUNTER — Other Ambulatory Visit: Payer: Self-pay | Admitting: Family Medicine

## 2018-10-12 DIAGNOSIS — I5042 Chronic combined systolic (congestive) and diastolic (congestive) heart failure: Secondary | ICD-10-CM

## 2018-10-12 NOTE — Patient Instructions (Signed)
   Your procedure is scheduled on: 10/20/2018  Report to [facility] at    10:45 AM.  Call this number if you have problems the morning of surgery: [phone]   Remember:   Do not Eat or Drink after midnight   :  Take these medicines the morning of surgery with A SIP OF WATER: Protonix, Isosorbide, metoprolol, Zoloft and use flonase and inhalers if needed      Stop Taking Comadin as directed by Dr Jeffie Pollock   Do not wear jewelry, make-up or nail polish.  Do not wear lotions, powders, or perfumes. You may wear deodorant.  Do not shave 48 hours prior to surgery. Men may shave face and neck.  Do not bring valuables to the hospital.  Contacts, dentures or bridgework may not be worn into surgery.  Leave suitcase in the car. After surgery it may be brought to your room.  For patients admitted to the hospital, checkout time is 11:00 AM the day of discharge.   Patients discharged the day of surgery will not be allowed to drive home.    Special Instructions: Shower using CHG night before surgery and shower the day of surgery use CHG.  Use special wash - you have one bottle of CHG for all showers.  You should use approximately 1/2 of the bottle for each shower.

## 2018-10-17 ENCOUNTER — Other Ambulatory Visit (HOSPITAL_COMMUNITY)
Admission: RE | Admit: 2018-10-17 | Discharge: 2018-10-17 | Disposition: A | Discharge status: Home / Self Care | Payer: Medicare Other | Source: Ambulatory Visit | Attending: Urology | Admitting: Urology

## 2018-10-17 ENCOUNTER — Encounter (HOSPITAL_COMMUNITY)
Admission: RE | Admit: 2018-10-17 | Discharge: 2018-10-17 | Disposition: A | Discharge status: Home / Self Care | Payer: Medicare Other | Source: Ambulatory Visit | Attending: Urology | Admitting: Urology

## 2018-10-17 ENCOUNTER — Encounter (HOSPITAL_COMMUNITY): Payer: Self-pay

## 2018-10-17 ENCOUNTER — Other Ambulatory Visit: Payer: Self-pay

## 2018-10-17 DIAGNOSIS — K219 Gastro-esophageal reflux disease without esophagitis: Secondary | ICD-10-CM | POA: Diagnosis not present

## 2018-10-17 DIAGNOSIS — F0390 Unspecified dementia without behavioral disturbance: Secondary | ICD-10-CM | POA: Diagnosis not present

## 2018-10-17 DIAGNOSIS — Z1159 Encounter for screening for other viral diseases: Secondary | ICD-10-CM | POA: Diagnosis not present

## 2018-10-17 DIAGNOSIS — J449 Chronic obstructive pulmonary disease, unspecified: Secondary | ICD-10-CM | POA: Diagnosis not present

## 2018-10-17 DIAGNOSIS — E7800 Pure hypercholesterolemia, unspecified: Secondary | ICD-10-CM | POA: Diagnosis not present

## 2018-10-17 DIAGNOSIS — Z89619 Acquired absence of unspecified leg above knee: Secondary | ICD-10-CM | POA: Diagnosis not present

## 2018-10-17 DIAGNOSIS — Z79899 Other long term (current) drug therapy: Secondary | ICD-10-CM | POA: Diagnosis not present

## 2018-10-17 DIAGNOSIS — Z7901 Long term (current) use of anticoagulants: Secondary | ICD-10-CM | POA: Diagnosis not present

## 2018-10-17 DIAGNOSIS — Z86718 Personal history of other venous thrombosis and embolism: Secondary | ICD-10-CM | POA: Diagnosis not present

## 2018-10-17 DIAGNOSIS — C680 Malignant neoplasm of urethra: Secondary | ICD-10-CM | POA: Diagnosis not present

## 2018-10-17 DIAGNOSIS — I428 Other cardiomyopathies: Secondary | ICD-10-CM | POA: Diagnosis not present

## 2018-10-17 DIAGNOSIS — Z87891 Personal history of nicotine dependence: Secondary | ICD-10-CM | POA: Diagnosis not present

## 2018-10-17 DIAGNOSIS — Z8551 Personal history of malignant neoplasm of bladder: Secondary | ICD-10-CM | POA: Diagnosis not present

## 2018-10-17 DIAGNOSIS — I2510 Atherosclerotic heart disease of native coronary artery without angina pectoris: Secondary | ICD-10-CM | POA: Diagnosis not present

## 2018-10-17 DIAGNOSIS — I252 Old myocardial infarction: Secondary | ICD-10-CM | POA: Diagnosis not present

## 2018-10-17 DIAGNOSIS — I739 Peripheral vascular disease, unspecified: Secondary | ICD-10-CM | POA: Diagnosis not present

## 2018-10-17 DIAGNOSIS — M1990 Unspecified osteoarthritis, unspecified site: Secondary | ICD-10-CM | POA: Diagnosis not present

## 2018-10-17 DIAGNOSIS — F329 Major depressive disorder, single episode, unspecified: Secondary | ICD-10-CM | POA: Diagnosis not present

## 2018-10-17 DIAGNOSIS — Z7951 Long term (current) use of inhaled steroids: Secondary | ICD-10-CM | POA: Diagnosis not present

## 2018-10-17 DIAGNOSIS — I5022 Chronic systolic (congestive) heart failure: Secondary | ICD-10-CM | POA: Diagnosis not present

## 2018-10-17 DIAGNOSIS — M109 Gout, unspecified: Secondary | ICD-10-CM | POA: Diagnosis not present

## 2018-10-17 DIAGNOSIS — I110 Hypertensive heart disease with heart failure: Secondary | ICD-10-CM | POA: Diagnosis not present

## 2018-10-17 DIAGNOSIS — H409 Unspecified glaucoma: Secondary | ICD-10-CM | POA: Diagnosis not present

## 2018-10-17 LAB — BASIC METABOLIC PANEL
Anion gap: 7 (ref 5–15)
BUN: 24 mg/dL — ABNORMAL HIGH (ref 8–23)
CO2: 27 mmol/L (ref 22–32)
Calcium: 9.5 mg/dL (ref 8.9–10.3)
Chloride: 108 mmol/L (ref 98–111)
Creatinine, Ser: 1.31 mg/dL — ABNORMAL HIGH (ref 0.61–1.24)
GFR calc Af Amer: 53 mL/min — ABNORMAL LOW (ref >60)
GFR calc non Af Amer: 46 mL/min — ABNORMAL LOW (ref >60)
Glucose, Bld: 101 mg/dL — ABNORMAL HIGH (ref 70–99)
Potassium: 4 mmol/L (ref 3.5–5.1)
Sodium: 142 mmol/L (ref 135–145)

## 2018-10-17 LAB — SARS CORONAVIRUS 2 (TAT 6-24 HRS): SARS Coronavirus 2: NEGATIVE

## 2018-10-18 NOTE — H&P (Signed)
CC: I have bladder cancer.  HPI: Scott [name] is a 85 year-old male established patient who is here for bladder cancer.    Mr. Kisling returns today in f/u. 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 He is doing well with no hematuria. He remains on Myrbetriq for his history of UUI. His UA is unremarkable today.      AUA Symptom Score: He never has the sensation of not emptying his bladder completely after finishing urinating. He never has to urinate again less that two hours after he has finished urinating. He does not have to stop and start again several times when he urinates. 50% of the time he finds it difficult to postpone urination. He never has a weak urinary stream. He never has to push or strain to begin urination. He has to get up to urinate 2 times from the time he goes to bed until the time he gets up in the morning.   Calculated AUA Symptom Score: 5    ALLERGIES: No Allergies    MEDICATIONS: Coumadin 2 mg tablet  Coumadin 2.5 mg tablet  Metoprolol Succinate 50 mg tablet, extended release 24 hr  Myrbetriq 25 mg tablet, extended release 24 hr 1 tablet PO Daily  Simvastatin 10 mg tablet  Albuterol Sulfate 2.5 mg/3 ml (0.083 %) vial, nebulizer  Donepezil Hcl 10 mg tablet  Ferrous Sulfate 325 mg (65 mg iron) tablet  Fluticasone Propionate 50 mcg/actuation spray, suspension  Folic Acid 1 mg tablet  Furosemide 40 mg tablet  Gabapentin 100 mg capsule  Isosorbide Dinitrate 30 mg tablet  Nitrostat 0.4 mg tablet, sublingual  Pantoprazole Sodium 40 mg tablet, delayed release  Sertraline Hcl 50 mg tablet  Uloric 40 mg tablet  Zocor     GU PSH: Bladder Instill AntiCA Agent - 2017 Cysto Bladder Ureth Biopsy - 2013, 2013 Cystoscopy - 05/05/2018, 12/02/2017, 11/19/2016, 2018, 2017 Cystoscopy Fulguration - 12/09/2016, 2018, 2017 Cystoscopy TURBT <2 cm - 12/20/2017, 2018 Cystoscopy TURBT 2-5 cm - 12/09/2016,  2017, 2013 Cystoscopy TURP - 2013      PSH Notes: Bladder Injection Of Cancer Treatment, Cystoscopy With Fulguration Medium Lesion (2-5cm), Amputation Of Leg Above Knee, Transurethral Resection Of Prostate (TURP), Cystoscopy With Biopsy, Cystoscopy With Biopsy, Cystoscopy With Fulguration Medium Lesion (2-5cm), No Surgical Problems   NON-GU PSH: Amputate Leg At Thigh - 2017    GU PMH: Urethral Cancer - 06/16/2018 Bladder Cancer Lateral, LG NMIBC. He will return in 3 months for cystoscopy. - 01/20/2018 Bladder Cancer Posterior, dysplasia. - 01/20/2018, He has lesions on the posterior wall that are suspicious for CIS. I will send a cytology but will get him set up for a cystoscopy with biopsy and fulguration under MAC. He will need to hold his warfarin. , - 12/02/2017 Personal Hx Urinary Tract Infections, He completed treatment for a UTI 2 days ago. I will send a UA an culture to confirm that it has cleared. - 04/08/2017 Urge incontinence (Worsening) - 01/07/2017 Bladder Cancer, Unspec, He had a recurrence in his prostatic urethra at the bladder. - 2018 Urethral Cancer, He has a small papillary urethral cancer. - 2017 Bladder Cancer Trigone , Malignant neoplasm of trigone of bladder - 2017 Urinary Urgency, Urinary urgency - 2017 Gross hematuria, Gross hematuria - 2017 History of bladder cancer, History of bladder cancer - 2015 BPH w/LUTS, Benign prostatic hyperplasia with urinary obstruction - 2014      PMH  Notes:  2012-08-11 14:20:22 - Note: Heart Block  2011-09-20 12:53:15 - Note: Arthritis  He was treated for pneumonia in mid July 2017.    NON-GU PMH: Personal history of other diseases of the circulatory system, History of congestive heart disease - 2017, History of hypertension, - 2014 Acute embolism and thrombosis of unspecified deep veins of unspecified distal lower extremity, Deep vein thrombosis of distal lower extremity - 0623 Chronic systolic (congestive) heart failure, Chronic  systolic congestive heart failure - 2014 DVT, History, H/O blood clots - 2014 Gout, Gout - 2014 Personal history of other diseases of the nervous system and sense organs, History of glaucoma - 2014 Personal history of other endocrine, nutritional and metabolic disease, History of hypercholesterolemia - 2014 Encounter for general adult medical examination without abnormal findings, Encounter for preventive health examination    FAMILY HISTORY: Death In The Family Father - Runs In Family Death In The Family Mother - Runs In Family Family Health Status Number - Runs In Family   SOCIAL HISTORY: None    Notes: Former smoker, Marital History - Currently Married, Caffeine Use, Alcohol Use   REVIEW OF SYSTEMS:    GU Review Male:   Patient denies frequent urination, hard to postpone urination, burning/ pain with urination, get up at night to urinate, leakage of urine, stream starts and stops, trouble starting your stream, have to strain to urinate , erection problems, and penile pain.  Gastrointestinal (Upper):   Patient denies nausea, vomiting, and indigestion/ heartburn.  Gastrointestinal (Lower):   Patient denies diarrhea and constipation.  Constitutional:   Patient denies fever, night sweats, weight loss, and fatigue.  Skin:   Patient denies skin rash/ lesion and itching.  Eyes:   Patient denies blurred vision and double vision.  Ears/ Nose/ Throat:   Patient denies sore throat and sinus problems.  Hematologic/Lymphatic:   Patient denies swollen glands and easy bruising.  Cardiovascular:   Patient denies leg swelling and chest pains.  Respiratory:   Patient denies cough and shortness of breath.  Endocrine:   Patient denies excessive thirst.  Musculoskeletal:   Patient denies back pain and joint pain.  Neurological:   Patient denies headaches and dizziness.  Psychologic:   Patient denies depression and anxiety.   VITAL SIGNS:      09/22/2018 11:36 AM  Weight 165 lb / 74.84 kg  Height 66  in / 167.64 cm  BP 123/76 mmHg  Pulse 55 /min  Temperature 98.0 F / 36.6 C  BMI 26.6 kg/m   MULTI-SYSTEM PHYSICAL EXAMINATION:    Constitutional: Well-nourished. No physical deformities. Normally developed. Good grooming.  Respiratory: Normal breath sounds. No labored breathing, no use of accessory muscles.   Cardiovascular: Regular rate and rhythm. No murmur, no gallop.      PAST DATA REVIEWED:  Source Of History:  Patient  Urine Test Review:   Urinalysis   PROCEDURES:         Flexible Cystoscopy - 52000  Risks, benefits, and some of the potential complications of the procedure were discussed. 7m of 2% lidocaine jelly was instilled intraurethrally.  Cipro 5037mgiven for antibiotic prophylaxis.     Meatus:  Normal size. Normal location. Normal condition.  Urethra:  No strictures.  External Sphincter:  Normal.  Verumontanum:  Normal.  Prostate:  Borderline obstructing. Partially resected prostate fossa. No hyperplasia. There is a 62m89mapillary tumor on the left in the proximal prostatic urethra.   Bladder Neck:  Non-obstructing.  Ureteral Orifices:  Normal location.  Normal size. Normal shape. Effluxed clear urine.  Bladder:  Moderate trabeculation. No tumors. Normal mucosa. No stones.      The procedure was well tolerated and there were no complications.         Urinalysis - 81003 Dipstick Dipstick Cont'd  Specimen: Voided Bilirubin: Neg  Color: Yellow Ketones: Neg  Appearance: Clear Blood: Trace Intact  Specific Gravity: 1.010 Protein: Neg  pH: 6.0 Urobilinogen: 0.2  Glucose: Neg Nitrites: Neg    Leukocyte Esterase: Trace    ASSESSMENT:      ICD-10 Details  1 GU:   Urethral Cancer - C67.5 He has a small recurrents in the left prostatic recurrence. I will get him set up for cystoscopy with fulguration under sedation and local. He is aware of the risks.   2   Urge incontinence - N39.41 Continue Myrbetriq.    PLAN:           Schedule Return Visit/Planned  Activity: Next Available Appointment - Schedule Surgery

## 2018-10-20 ENCOUNTER — Ambulatory Visit (HOSPITAL_COMMUNITY): Payer: Medicare Other | Admitting: Anesthesiology

## 2018-10-20 ENCOUNTER — Encounter (HOSPITAL_COMMUNITY)
Admission: RE | Disposition: A | Discharge status: Home / Self Care | Payer: Self-pay | Source: Home / Self Care | Attending: Urology

## 2018-10-20 ENCOUNTER — Ambulatory Visit (HOSPITAL_COMMUNITY)
Admission: RE | Admit: 2018-10-20 | Discharge: 2018-10-20 | Disposition: A | Discharge status: Home / Self Care | Payer: Medicare Other | Attending: Urology | Admitting: Urology

## 2018-10-20 ENCOUNTER — Encounter (HOSPITAL_COMMUNITY): Payer: Self-pay | Admitting: *Deleted

## 2018-10-20 DIAGNOSIS — I110 Hypertensive heart disease with heart failure: Secondary | ICD-10-CM | POA: Diagnosis not present

## 2018-10-20 DIAGNOSIS — I739 Peripheral vascular disease, unspecified: Secondary | ICD-10-CM | POA: Diagnosis not present

## 2018-10-20 DIAGNOSIS — Z8551 Personal history of malignant neoplasm of bladder: Secondary | ICD-10-CM | POA: Diagnosis not present

## 2018-10-20 DIAGNOSIS — I252 Old myocardial infarction: Secondary | ICD-10-CM | POA: Insufficient documentation

## 2018-10-20 DIAGNOSIS — D494 Neoplasm of unspecified behavior of bladder: Secondary | ICD-10-CM | POA: Diagnosis not present

## 2018-10-20 DIAGNOSIS — Z7951 Long term (current) use of inhaled steroids: Secondary | ICD-10-CM | POA: Diagnosis not present

## 2018-10-20 DIAGNOSIS — F329 Major depressive disorder, single episode, unspecified: Secondary | ICD-10-CM | POA: Insufficient documentation

## 2018-10-20 DIAGNOSIS — K219 Gastro-esophageal reflux disease without esophagitis: Secondary | ICD-10-CM | POA: Insufficient documentation

## 2018-10-20 DIAGNOSIS — I2510 Atherosclerotic heart disease of native coronary artery without angina pectoris: Secondary | ICD-10-CM | POA: Insufficient documentation

## 2018-10-20 DIAGNOSIS — M1990 Unspecified osteoarthritis, unspecified site: Secondary | ICD-10-CM | POA: Insufficient documentation

## 2018-10-20 DIAGNOSIS — F0390 Unspecified dementia without behavioral disturbance: Secondary | ICD-10-CM | POA: Insufficient documentation

## 2018-10-20 DIAGNOSIS — Z7901 Long term (current) use of anticoagulants: Secondary | ICD-10-CM | POA: Diagnosis not present

## 2018-10-20 DIAGNOSIS — Z1159 Encounter for screening for other viral diseases: Secondary | ICD-10-CM | POA: Insufficient documentation

## 2018-10-20 DIAGNOSIS — I428 Other cardiomyopathies: Secondary | ICD-10-CM | POA: Insufficient documentation

## 2018-10-20 DIAGNOSIS — C680 Malignant neoplasm of urethra: Secondary | ICD-10-CM | POA: Insufficient documentation

## 2018-10-20 DIAGNOSIS — Z86718 Personal history of other venous thrombosis and embolism: Secondary | ICD-10-CM | POA: Insufficient documentation

## 2018-10-20 DIAGNOSIS — E7800 Pure hypercholesterolemia, unspecified: Secondary | ICD-10-CM | POA: Insufficient documentation

## 2018-10-20 DIAGNOSIS — Z79899 Other long term (current) drug therapy: Secondary | ICD-10-CM | POA: Diagnosis not present

## 2018-10-20 DIAGNOSIS — I5022 Chronic systolic (congestive) heart failure: Secondary | ICD-10-CM | POA: Diagnosis not present

## 2018-10-20 DIAGNOSIS — M109 Gout, unspecified: Secondary | ICD-10-CM | POA: Insufficient documentation

## 2018-10-20 DIAGNOSIS — Z87891 Personal history of nicotine dependence: Secondary | ICD-10-CM | POA: Insufficient documentation

## 2018-10-20 DIAGNOSIS — H409 Unspecified glaucoma: Secondary | ICD-10-CM | POA: Insufficient documentation

## 2018-10-20 DIAGNOSIS — J449 Chronic obstructive pulmonary disease, unspecified: Secondary | ICD-10-CM | POA: Insufficient documentation

## 2018-10-20 DIAGNOSIS — Z89619 Acquired absence of unspecified leg above knee: Secondary | ICD-10-CM | POA: Insufficient documentation

## 2018-10-20 HISTORY — PX: CYSTOSCOPY: SHX5120

## 2018-10-20 SURGERY — CYSTOSCOPY
Anesthesia: Monitor Anesthesia Care | Site: Prostate

## 2018-10-20 MED ORDER — ACETAMINOPHEN 325 MG PO TABS: 650.0000 mg | ORAL_TABLET | ORAL | Status: DC | PRN

## 2018-10-20 MED ORDER — LIDOCAINE HCL URETHRAL/MUCOSAL 2 % EX GEL
CUTANEOUS | Status: AC
  Filled 2018-10-20: qty 10

## 2018-10-20 MED ORDER — HYDROCODONE-ACETAMINOPHEN 7.5-325 MG PO TABS: 1.0000 | ORAL_TABLET | Freq: Once | ORAL | Status: DC | PRN

## 2018-10-20 MED ORDER — PROPOFOL 10 MG/ML IV BOLUS
INTRAVENOUS | Status: DC | PRN
  Administered 2018-10-20 (×2): 20 mg via INTRAVENOUS
  Administered 2018-10-20 (×2): 10 mg via INTRAVENOUS

## 2018-10-20 MED ORDER — SODIUM CHLORIDE 0.9% FLUSH: 3.0000 mL | Freq: Two times a day (BID) | INTRAVENOUS | Status: DC

## 2018-10-20 MED ORDER — STERILE WATER FOR IRRIGATION IR SOLN
Status: DC | PRN
  Administered 2018-10-20: 500 mL
  Administered 2018-10-20: 1000 mL

## 2018-10-20 MED ORDER — LIDOCAINE HCL URETHRAL/MUCOSAL 2 % EX GEL
CUTANEOUS | Status: DC | PRN
  Administered 2018-10-20: 1 via URETHRAL

## 2018-10-20 MED ORDER — PROMETHAZINE HCL 25 MG/ML IJ SOLN: 6.2500 mg | INTRAMUSCULAR | Status: DC | PRN

## 2018-10-20 MED ORDER — MORPHINE SULFATE (PF) 2 MG/ML IV SOLN: 1.0000 mg | INTRAVENOUS | Status: DC | PRN

## 2018-10-20 MED ORDER — CEFAZOLIN SODIUM-DEXTROSE 2-4 GM/100ML-% IV SOLN
2.0000 g | Freq: Once | INTRAVENOUS | Status: AC
  Administered 2018-10-20: 2 g via INTRAVENOUS

## 2018-10-20 MED ORDER — CEFAZOLIN SODIUM-DEXTROSE 2-4 GM/100ML-% IV SOLN
INTRAVENOUS | Status: AC
  Filled 2018-10-20: qty 100

## 2018-10-20 MED ORDER — SODIUM CHLORIDE 0.9% FLUSH: 3.0000 mL | INTRAVENOUS | Status: DC | PRN

## 2018-10-20 MED ORDER — LACTATED RINGERS IV SOLN
INTRAVENOUS | Status: DC
  Administered 2018-10-20: 12:00:00 via INTRAVENOUS

## 2018-10-20 MED ORDER — HYDROMORPHONE HCL 1 MG/ML IJ SOLN: 0.2500 mg | INTRAMUSCULAR | Status: DC | PRN

## 2018-10-20 MED ORDER — PROPOFOL 10 MG/ML IV BOLUS
INTRAVENOUS | Status: AC
  Filled 2018-10-20: qty 20

## 2018-10-20 MED ORDER — ACETAMINOPHEN 650 MG RE SUPP: 650.0000 mg | RECTAL | Status: DC | PRN

## 2018-10-20 MED ORDER — MIDAZOLAM HCL 2 MG/2ML IJ SOLN: 0.5000 mg | Freq: Once | INTRAMUSCULAR | Status: DC | PRN

## 2018-10-20 MED ORDER — SODIUM CHLORIDE 0.9 % IV SOLN: 250.0000 mL | INTRAVENOUS | Status: DC | PRN

## 2018-10-20 MED ORDER — OXYCODONE HCL 5 MG PO TABS: 5.0000 mg | ORAL_TABLET | ORAL | Status: DC | PRN

## 2018-10-20 SURGICAL SUPPLY — 13 items
BAG DRAIN URO TABLE W/ADPT NS (BAG) ×2 IMPLANT
CLOTH BEACON ORANGE TIMEOUT ST (SAFETY) ×2 IMPLANT
GLOVE BIOGEL PI IND STRL 7.0 (GLOVE) ×2 IMPLANT
GLOVE BIOGEL PI INDICATOR 7.0 (GLOVE) ×2
GLOVE ECLIPSE 6.5 STRL STRAW (GLOVE) ×2 IMPLANT
GLOVE SURG SS PI 8.0 STRL IVOR (GLOVE) ×2 IMPLANT
GOWN STRL REUS W/TWL LRG LVL3 (GOWN DISPOSABLE) ×2 IMPLANT
GOWN STRL REUS W/TWL XL LVL3 (GOWN DISPOSABLE) ×2 IMPLANT
KIT TURNOVER CYSTO (KITS) ×2 IMPLANT
PACK CYSTO (CUSTOM PROCEDURE TRAY) ×2 IMPLANT
PAD ARMBOARD 7.5X6 YLW CONV (MISCELLANEOUS) ×2 IMPLANT
WATER STERILE IRR 3000ML UROMA (IV SOLUTION) ×2 IMPLANT
WATER STERILE IRR 500ML POUR (IV SOLUTION) ×2 IMPLANT

## 2018-10-20 NOTE — Op Note (Signed)
Procedure: Cystoscopy with fulguration of 8 mm and 5 mm left prostatic urethral tumors.  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  Postop diagnosis: Same.  Surgeon: Dr. [name].  Anesthesia: MAC.  Specimen: None.  Drains: None.  EBL: None.  Complications: None.  Indications: The patient is a 85 year old male with a history of recurrent urothelial carcinoma who was noted on recent cystoscopy to have a recurrence on the left prostatic urethra.  He presents now for fulguration.  Procedure: He was given 2 g of Ancef.  He was placed in the lithotomy position and sedation was given as needed.  He was prepped with Betadine solution and draped in the usual sterile fashion.  His urethra was instilled with 2% lidocaine jelly.  Cystoscopy was performed using the 23 [place] scope and 30 degree lens.  Examination revealed a normal urethra.  The external sphincter was intact.  The prostatic urethra was approximately 3 to 4 cm in length with lateral lobe hyperplasia.  On the left lateral lobe just proximal to the verumontanum was a 5 mm area of papillary change consistent with recurrent tumor and on the very proximal prostatic urethra on the left there was an 8 mm area of papillary change consistent with recurrent tumor.  There was evidence of prior resection of the right proximal prostatic lobe.  Inspection of the bladder revealed mild trabeculation.  There was a scar on the right lateral wall from prior resection.  No mucosal lesions were identified.  Ureteral orifices were unremarkable.  After a thorough inspection, a Bugbee electrode was used to generously fulgurate the 2 lesions in the prostatic urethra with a reasonable margin of mucosa fulgurated as well.  After completion of the fulguration, the bladder was drained and the cystoscope was removed.  He was taken down from lithotomy position and moved to recovery room in stable condition.  There were no  complications.

## 2018-10-20 NOTE — Progress Notes (Signed)
Call to Hulan Fess at [phone].  Advised son that his father will soon be ready for discharge from Hospital. "I am doing some work I cannot stop right now, but I will leave [facility] as soon as I can , may take about 40minutes".

## 2018-10-20 NOTE — Anesthesia Postprocedure Evaluation (Signed)
Anesthesia Post Note  Patient: Scott [name]  Procedure(s) Performed: CYSTOSCOPY WITH FULGURATION OF PROSTATIC URETHRAL TUMOR (N/A Prostate)  Patient location during evaluation: PACU Anesthesia Type: MAC Level of consciousness: awake Pain management: pain level controlled Vital Signs Assessment: post-procedure vital signs reviewed and stable Respiratory status: spontaneous breathing Cardiovascular status: blood pressure returned to baseline Postop Assessment: no headache Anesthetic complications: no     Last Vitals:  Vitals:   10/20/18 1326 10/20/18 1347  BP: (!) 171/80 (!) 162/78  Pulse:  88  Resp: 18 20  Temp: 36.5 C 36.6 C  SpO2: 95% 97%    Last Pain:  Vitals:   10/20/18 1347  TempSrc: Oral  PainSc: 0-No pain                 Talbert Forest Garison Genova

## 2018-10-20 NOTE — Anesthesia Preprocedure Evaluation (Signed)
Anesthesia Evaluation  Patient identified by MRN, date of birth, ID band Patient awake    Reviewed: Allergy & Precautions, NPO status , Patient's Chart, lab work & pertinent test results  Airway Mallampati: II  TM Distance: >3 FB Neck ROM: Full    Dental no notable dental hx. (+) Poor Dentition, Missing   Pulmonary shortness of breath and with exertion, pneumonia, resolved, COPD,  COPD inhaler, former smoker,    Pulmonary exam normal breath sounds clear to auscultation       Cardiovascular Exercise Tolerance: Poor hypertension, Pt. on medications + CAD, + Past MI, + Peripheral Vascular Disease and +CHF  Normal cardiovascular exam+ dysrhythmias II Rhythm:Regular Rate:Normal  Non ambulatory  H/o Nonsustained Vtach -not an AICD candidate  Non ischemic cardiomyopathy last EF 2018 ~35%   Neuro/Psych PSYCHIATRIC DISORDERS Depression Dementia negative neurological ROS     GI/Hepatic Neg liver ROS, GERD  Medicated and Controlled,  Endo/Other  negative endocrine ROS  Renal/GU Renal InsufficiencyRenal diseaseHere for fulgeration   negative genitourinary   Musculoskeletal negative musculoskeletal ROS (+)   Abdominal   Peds negative pediatric ROS (+)  Hematology negative hematology ROS (+)   Anesthesia Other Findings   Reproductive/Obstetrics negative OB ROS                             Anesthesia Physical Anesthesia Plan  ASA: IV  Anesthesia Plan: MAC   Post-op Pain Management:    Induction: Intravenous  PONV Risk Score and Plan: 1 and Treatment may vary due to age or medical condition, Ondansetron, Propofol infusion and TIVA  Airway Management Planned: Nasal Cannula and Simple Face Mask  Additional Equipment:   Intra-op Plan:   Post-operative Plan:   Informed Consent: I have reviewed the patients History and Physical, chart, labs and discussed the procedure including the risks,  benefits and alternatives for the proposed anesthesia with the patient or authorized representative who has indicated his/her understanding and acceptance.   Patient has DNR.  Discussed DNR with patient, Discussed DNR with power of attorney and Suspend DNR.   Dental advisory given  Plan Discussed with: CRNA  Anesthesia Plan Comments: (Plan full PPE use  Plan MAC as tolerated , GA vs GETA as needed  Plan to suspend DNR in periop period )        Anesthesia Quick Evaluation

## 2018-10-20 NOTE — Transfer of Care (Signed)
Immediate Anesthesia Transfer of Care Note  Patient: Scott [name]  Procedure(s) Performed: CYSTOSCOPY WITH FULGURATION OF PROSTATIC URETHRAL TUMOR (N/A Prostate)  Patient Location: PACU  Anesthesia Type:MAC  Level of Consciousness: awake, alert  and oriented  Airway & Oxygen Therapy: Patient Spontanous Breathing and Patient connected to face mask oxygen  Post-op Assessment: Report given to RN and Post -op Vital signs reviewed and stable  Post vital signs: Reviewed and stable  Last Vitals:  Vitals Value Taken Time  BP    Temp    Pulse    Resp 18 10/20/18 1259  SpO2    Vitals shown include unvalidated device data.  Last Pain:  Vitals:   10/20/18 1139  TempSrc: Oral  PainSc: 0-No pain      Patients Stated Pain Goal: 4 (35/36/14 4315)  Complications: No apparent anesthesia complications

## 2018-10-20 NOTE — Discharge Instructions (Addendum)
CYSTOSCOPY HOME CARE INSTRUCTIONS  Activity: Rest for the remainder of the day.  Do not drive or operate equipment today.  You may resume normal activities in one to two days as instructed by your physician.   Meals: Drink plenty of liquids and eat light foods such as gelatin or soup this evening.  You may return to a normal meal plan tomorrow.  Return to Work: You may return to work in one to two days or as instructed by your physician.  Special Instructions / Symptoms: Call your physician if any of these symptoms occur:   -persistent or heavy bleeding  -bleeding which continues after first few urination  -large blood clots that are difficult to pass  -urine stream diminishes or stops completely  -fever equal to or higher than 101 degrees Farenheit.  -cloudy urine with a strong, foul odor  -severe pain  Females should always wipe from front to back after elimination.  You may feel some burning pain when you urinate.  This should disappear with time.  Applying moist heat to the lower abdomen or a hot tub bath may help relieve the pain. \   Patient Signature:  ________________________________________________________  Nurse's Signature:  ________________________________________________________      Monitored Anesthesia Care, Care After These instructions provide you with information about caring for yourself after your procedure. Your health care provider may also give you more specific instructions. Your treatment has been planned according to current medical practices, but problems sometimes occur. Call your health care provider if you have any problems or questions after your procedure. What can I expect after the procedure? After your procedure, you may:  Feel sleepy for several hours.  Feel clumsy and have poor balance for several hours.  Feel forgetful about what happened after the procedure.  Have poor judgment for several hours.  Feel nauseous or vomit.  Have a  sore throat if you had a breathing tube during the procedure. Follow these instructions at home: For at least 24 hours after the procedure:      Have a responsible adult stay with you. It is important to have someone help care for you until you are awake and alert.  Rest as needed.  Do not: ? Participate in activities in which you could fall or become injured. ? Drive. ? Use heavy machinery. ? Drink alcohol. ? Take sleeping pills or medicines that cause drowsiness. ? Make important decisions or sign legal documents. ? Take care of children on your own. Eating and drinking  Follow the diet that is recommended by your health care provider.  If you vomit, drink water, juice, or soup when you can drink without vomiting.  Make sure you have little or no nausea before eating solid foods. General instructions  Take over-the-counter and prescription medicines only as told by your health care provider.  If you have sleep apnea, surgery and certain medicines can increase your risk for breathing problems. Follow instructions from your health care provider about wearing your sleep device: ? Anytime you are sleeping, including during daytime naps. ? While taking prescription pain medicines, sleeping medicines, or medicines that make you drowsy.  If you smoke, do not smoke without supervision.  Keep all follow-up visits as told by your health care provider. This is important. Contact a health care provider if:  You keep feeling nauseous or you keep vomiting.  You feel light-headed.  You develop a rash.  You have a fever. Get help right away if:  You have  trouble breathing. Summary  For several hours after your procedure, you may feel sleepy and have poor judgment.  Have a responsible adult stay with you for at least 24 hours or until you are awake and alert. This information is not intended to replace advice given to you by your health care provider. Make sure you discuss any  questions you have with your health care provider. Document Released: 07/20/2015 Document Revised: 06/27/2017 Document Reviewed: 07/20/2015 Elsevier Patient Education  2020 [employer].

## 2018-10-20 NOTE — Interval H&P Note (Signed)
History and Physical Interval Note:  10/20/2018 11:55 AM  Scott [name]  has presented today for surgery, with the diagnosis of PROSTATIC URETHRAL TUMOR.  The various methods of treatment have been discussed with the patient and family. After consideration of risks, benefits and other options for treatment, the patient has consented to  Procedure(s): CYSTOSCOPY WITH FULGURATION (N/A) as a surgical intervention.  The patient's history has been reviewed, patient examined, no change in status, stable for surgery.  I have reviewed the patient's chart and labs.  Questions were answered to the patient's satisfaction.     [name]

## 2018-10-23 ENCOUNTER — Encounter (HOSPITAL_COMMUNITY): Payer: Self-pay | Admitting: Urology

## 2018-10-31 ENCOUNTER — Other Ambulatory Visit: Payer: Self-pay

## 2018-10-31 ENCOUNTER — Ambulatory Visit (INDEPENDENT_AMBULATORY_CARE_PROVIDER_SITE_OTHER): Payer: Medicare Other | Admitting: Nurse Practitioner

## 2018-10-31 ENCOUNTER — Telehealth: Payer: Self-pay | Admitting: Family Medicine

## 2018-10-31 ENCOUNTER — Encounter: Payer: Self-pay | Admitting: Nurse Practitioner

## 2018-10-31 DIAGNOSIS — R410 Disorientation, unspecified: Secondary | ICD-10-CM | POA: Diagnosis not present

## 2018-10-31 DIAGNOSIS — R443 Hallucinations, unspecified: Secondary | ICD-10-CM

## 2018-10-31 MED ORDER — SULFAMETHOXAZOLE-TRIMETHOPRIM 800-160 MG PO TABS: 1.0000 | ORAL_TABLET | Freq: Two times a day (BID) | ORAL | 0 refills | Status: DC

## 2018-10-31 MED ORDER — CIPROFLOXACIN HCL 500 MG PO TABS: 500.0000 mg | ORAL_TABLET | Freq: Two times a day (BID) | ORAL | 0 refills | Status: DC

## 2018-10-31 NOTE — Progress Notes (Signed)
Virtual Visit via telephone Note  I connected with Scott [name] on 10/31/18 at 2:55 by telephone and verified that I am speaking with the correct person using two identifiers. Scott [name] is currently located at home and  His son  is currently with her during visit. The provider, Mary-Margaret Hassell Done, FNP is located in their office at time of visit.  I discussed the limitations, risks, security and privacy concerns of performing an evaluation and management service by telephone and the availability of in person appointments. I also discussed with the patient that there may be a patient responsible charge related to this service. The patient expressed understanding and agreed to proceed.   History and Present Illness:   Chief Complaint: Urinary Tract Infection   HPI Patients sons calls in c/o-  * hallucinations- said there was a baby in the room when there wasn't- not able to separate a dream from reality. * having trouble standing to take a shower this morning * not as alert as he usually is * had polyps removed from bladder 1 week ago and has not been hisself since. Urine is dark and foul smelling.   * Is on coumadin and last INR was done 09/07/18 and was 2.6 Review of Systems  Constitutional: Positive for fever (?). Negative for chills.  HENT: Negative.   Respiratory: Negative.   Cardiovascular: Negative.   Genitourinary: Positive for frequency. Negative for dysuria.  Skin: Negative.   Neurological: Negative.   Psychiatric/Behavioral: Negative.   All other systems reviewed and are negative.    Observations/Objective: Unable to speak with patient- spoke with son.  Assessment and Plan: Scott [name] in today with chief complaint of Urinary Tract Infection   1. Hallucinations  2. Disorientation - Urinalysis, Complete Possible UTI from surgical procedure last week. Family will try to bring in specimen Meds ordered this encounter  Medications  . DISCONTD:  ciprofloxacin (CIPRO) 500 MG tablet    Sig: Take 1 tablet (500 mg total) by mouth 2 (two) times daily.    Dispense:  10 tablet    Refill:  0    Order Specific Question:   Supervising Provider    Answer:   Caryl Pina A [id number]  . sulfamethoxazole-trimethoprim (BACTRIM DS) 800-160 MG tablet    Sig: Take 1 tablet by mouth 2 (two) times daily.    Dispense:  20 tablet    Refill:  0    Do not fill cipro previous sent in. Thank You    Order Specific Question:   Supervising Provider    Answer:   Caryl Pina A [[id number]]      Follow Up Instructions: prn   I discussed the assessment and treatment plan with the patient. The patient was provided an opportunity to ask questions and all were answered. The patient agreed with the plan and demonstrated an understanding of the instructions.   The patient was advised to call back or seek an in-person evaluation if the symptoms worsen or if the condition fails to improve as anticipated.  The above assessment and management plan was discussed with the patient. The patient verbalized understanding of and has agreed to the management plan. Patient is aware to call the clinic if symptoms persist or worsen. Patient is aware when to return to the clinic for a follow-up visit. Patient educated on when it is appropriate to go to the emergency department.   Time call ended:  3:10  I provided  15 minutes of non-face-to-face time  during this encounter.    Mary-Margaret Hassell Done, FNP

## 2018-11-08 ENCOUNTER — Other Ambulatory Visit: Payer: Medicare Other

## 2018-11-08 ENCOUNTER — Telehealth: Payer: Self-pay | Admitting: Family Medicine

## 2018-11-08 ENCOUNTER — Other Ambulatory Visit: Payer: Self-pay

## 2018-11-08 ENCOUNTER — Other Ambulatory Visit: Payer: Self-pay | Admitting: Family Medicine

## 2018-11-08 DIAGNOSIS — R4182 Altered mental status, unspecified: Secondary | ICD-10-CM | POA: Diagnosis not present

## 2018-11-08 NOTE — Telephone Encounter (Signed)
Scott [name] is still not doing well.  He is falling asleep during meals and not acting himself.  Can they get an order from you to bring him in for labwork and urinalysis.  Possible UTI?  Please call and let them know if they can bring him in for this.

## 2018-11-08 NOTE — Telephone Encounter (Signed)
Labs placed. Caregiver aware and verbalizes understanding.

## 2018-11-08 NOTE — Telephone Encounter (Signed)
Yes do a CBC can panel and a urinalysis with culture, diagnosis altered mental status

## 2018-11-09 LAB — MICROSCOPIC EXAMINATION

## 2018-11-09 LAB — URINALYSIS, COMPLETE
Bilirubin, UA: NEGATIVE
Glucose, UA: NEGATIVE
Ketones, UA: NEGATIVE
Leukocytes,UA: NEGATIVE
Nitrite, UA: NEGATIVE
Protein,UA: NEGATIVE
RBC, UA: NEGATIVE
Specific Gravity, UA: 1.013 (ref 1.005–1.030)
Urobilinogen, Ur: 0.2 mg/dL (ref 0.2–1.0)
pH, UA: 5 (ref 5.0–7.5)

## 2018-11-09 LAB — SPECIMEN STATUS REPORT

## 2018-11-09 LAB — CBC WITH DIFFERENTIAL/PLATELET
Basophils Absolute: 0 10*3/uL (ref 0.0–0.2)
Basos: 1 %
EOS (ABSOLUTE): 0.1 10*3/uL (ref 0.0–0.4)
Eos: 3 %
Hematocrit: 42.7 % (ref 37.5–51.0)
Hemoglobin: 13.5 g/dL (ref 13.0–17.7)
Immature Grans (Abs): 0 10*3/uL (ref 0.0–0.1)
Immature Granulocytes: 0 %
Lymphocytes Absolute: 0.9 10*3/uL (ref 0.7–3.1)
Lymphs: 22 %
MCH: 27.7 pg (ref 26.6–33.0)
MCHC: 31.6 g/dL (ref 31.5–35.7)
MCV: 88 fL (ref 79–97)
Monocytes Absolute: 0.4 10*3/uL (ref 0.1–0.9)
Monocytes: 10 %
Neutrophils Absolute: 2.8 10*3/uL (ref 1.4–7.0)
Neutrophils: 64 %
Platelets: 158 10*3/uL (ref 150–450)
RBC: 4.88 x10E6/uL (ref 4.14–5.80)
RDW: 14.5 % (ref 11.6–15.4)
WBC: 4.3 10*3/uL (ref 3.4–10.8)

## 2018-11-09 LAB — URINE CULTURE

## 2018-11-10 ENCOUNTER — Other Ambulatory Visit: Payer: Self-pay | Admitting: Family Medicine

## 2018-11-10 DIAGNOSIS — I5042 Chronic combined systolic (congestive) and diastolic (congestive) heart failure: Secondary | ICD-10-CM

## 2018-11-22 ENCOUNTER — Other Ambulatory Visit: Payer: Self-pay | Admitting: Family

## 2018-11-22 ENCOUNTER — Other Ambulatory Visit: Payer: Self-pay | Admitting: Family Medicine

## 2018-11-29 ENCOUNTER — Other Ambulatory Visit: Payer: Self-pay | Admitting: Family Medicine

## 2018-12-05 ENCOUNTER — Other Ambulatory Visit: Payer: Self-pay | Admitting: Family Medicine

## 2018-12-05 DIAGNOSIS — I5042 Chronic combined systolic (congestive) and diastolic (congestive) heart failure: Secondary | ICD-10-CM

## 2018-12-10 ENCOUNTER — Other Ambulatory Visit: Payer: Self-pay

## 2018-12-10 ENCOUNTER — Inpatient Hospital Stay (HOSPITAL_COMMUNITY)
Admission: EM | Admit: 2018-12-10 | Discharge: 2018-12-12 | DRG: 291 | Disposition: A | Discharge status: Home / Self Care | Payer: Medicare Other | Attending: Internal Medicine | Admitting: Internal Medicine

## 2018-12-10 ENCOUNTER — Emergency Department (HOSPITAL_COMMUNITY): Payer: Medicare Other

## 2018-12-10 ENCOUNTER — Encounter (HOSPITAL_COMMUNITY): Payer: Self-pay | Admitting: Emergency Medicine

## 2018-12-10 DIAGNOSIS — R0600 Dyspnea, unspecified: Secondary | ICD-10-CM | POA: Diagnosis present

## 2018-12-10 DIAGNOSIS — F0280 Dementia in other diseases classified elsewhere without behavioral disturbance: Secondary | ICD-10-CM | POA: Diagnosis present

## 2018-12-10 DIAGNOSIS — Z87891 Personal history of nicotine dependence: Secondary | ICD-10-CM

## 2018-12-10 DIAGNOSIS — Z86718 Personal history of other venous thrombosis and embolism: Secondary | ICD-10-CM

## 2018-12-10 DIAGNOSIS — J9601 Acute respiratory failure with hypoxia: Secondary | ICD-10-CM

## 2018-12-10 DIAGNOSIS — Z86711 Personal history of pulmonary embolism: Secondary | ICD-10-CM

## 2018-12-10 DIAGNOSIS — I447 Left bundle-branch block, unspecified: Secondary | ICD-10-CM | POA: Diagnosis present

## 2018-12-10 DIAGNOSIS — N183 Chronic kidney disease, stage 3 unspecified: Secondary | ICD-10-CM

## 2018-12-10 DIAGNOSIS — G309 Alzheimer's disease, unspecified: Secondary | ICD-10-CM | POA: Diagnosis present

## 2018-12-10 DIAGNOSIS — I440 Atrioventricular block, first degree: Secondary | ICD-10-CM | POA: Diagnosis present

## 2018-12-10 DIAGNOSIS — R0603 Acute respiratory distress: Secondary | ICD-10-CM

## 2018-12-10 DIAGNOSIS — I5043 Acute on chronic combined systolic (congestive) and diastolic (congestive) heart failure: Secondary | ICD-10-CM | POA: Diagnosis not present

## 2018-12-10 DIAGNOSIS — Z7901 Long term (current) use of anticoagulants: Secondary | ICD-10-CM

## 2018-12-10 DIAGNOSIS — N400 Enlarged prostate without lower urinary tract symptoms: Secondary | ICD-10-CM | POA: Diagnosis present

## 2018-12-10 DIAGNOSIS — C679 Malignant neoplasm of bladder, unspecified: Secondary | ICD-10-CM | POA: Diagnosis present

## 2018-12-10 DIAGNOSIS — I2510 Atherosclerotic heart disease of native coronary artery without angina pectoris: Secondary | ICD-10-CM | POA: Diagnosis present

## 2018-12-10 DIAGNOSIS — R0602 Shortness of breath: Secondary | ICD-10-CM | POA: Diagnosis not present

## 2018-12-10 DIAGNOSIS — I480 Paroxysmal atrial fibrillation: Secondary | ICD-10-CM | POA: Diagnosis present

## 2018-12-10 DIAGNOSIS — Z8261 Family history of arthritis: Secondary | ICD-10-CM

## 2018-12-10 DIAGNOSIS — I5023 Acute on chronic systolic (congestive) heart failure: Secondary | ICD-10-CM

## 2018-12-10 DIAGNOSIS — Z20828 Contact with and (suspected) exposure to other viral communicable diseases: Secondary | ICD-10-CM | POA: Diagnosis present

## 2018-12-10 DIAGNOSIS — Z66 Do not resuscitate: Secondary | ICD-10-CM | POA: Diagnosis present

## 2018-12-10 DIAGNOSIS — Z8249 Family history of ischemic heart disease and other diseases of the circulatory system: Secondary | ICD-10-CM

## 2018-12-10 DIAGNOSIS — Z89612 Acquired absence of left leg above knee: Secondary | ICD-10-CM | POA: Diagnosis present

## 2018-12-10 DIAGNOSIS — Z801 Family history of malignant neoplasm of trachea, bronchus and lung: Secondary | ICD-10-CM

## 2018-12-10 DIAGNOSIS — I130 Hypertensive heart and chronic kidney disease with heart failure and stage 1 through stage 4 chronic kidney disease, or unspecified chronic kidney disease: Secondary | ICD-10-CM | POA: Diagnosis not present

## 2018-12-10 DIAGNOSIS — I739 Peripheral vascular disease, unspecified: Secondary | ICD-10-CM | POA: Diagnosis present

## 2018-12-10 DIAGNOSIS — R32 Unspecified urinary incontinence: Secondary | ICD-10-CM | POA: Diagnosis present

## 2018-12-10 DIAGNOSIS — E785 Hyperlipidemia, unspecified: Secondary | ICD-10-CM | POA: Diagnosis present

## 2018-12-10 DIAGNOSIS — I428 Other cardiomyopathies: Secondary | ICD-10-CM | POA: Diagnosis present

## 2018-12-10 DIAGNOSIS — I248 Other forms of acute ischemic heart disease: Secondary | ICD-10-CM | POA: Diagnosis present

## 2018-12-10 DIAGNOSIS — J441 Chronic obstructive pulmonary disease with (acute) exacerbation: Secondary | ICD-10-CM | POA: Diagnosis not present

## 2018-12-10 DIAGNOSIS — I10 Essential (primary) hypertension: Secondary | ICD-10-CM | POA: Diagnosis present

## 2018-12-10 LAB — SARS CORONAVIRUS 2 BY RT PCR (HOSPITAL ORDER, PERFORMED IN ~~LOC~~ HOSPITAL LAB): SARS Coronavirus 2: NEGATIVE

## 2018-12-10 LAB — TROPONIN I (HIGH SENSITIVITY)
Troponin I (High Sensitivity): 29 ng/L — ABNORMAL HIGH (ref <18)
Troponin I (High Sensitivity): 28 ng/L — ABNORMAL HIGH (ref <18)

## 2018-12-10 LAB — CBC
HCT: 46.5 % (ref 39.0–52.0)
Hemoglobin: 14.4 g/dL (ref 13.0–17.0)
MCH: 27.4 pg (ref 26.0–34.0)
MCHC: 31 g/dL (ref 30.0–36.0)
MCV: 88.4 fL (ref 80.0–100.0)
Platelets: 180 10*3/uL (ref 150–400)
RBC: 5.26 MIL/uL (ref 4.22–5.81)
RDW: 15.2 % (ref 11.5–15.5)
WBC: 4.1 10*3/uL (ref 4.0–10.5)
nRBC: 0 % (ref 0.0–0.2)

## 2018-12-10 LAB — PROTIME-INR
INR: 1.7 — ABNORMAL HIGH (ref 0.8–1.2)
Prothrombin Time: 19.3 seconds — ABNORMAL HIGH (ref 11.4–15.2)

## 2018-12-10 LAB — URINALYSIS, ROUTINE W REFLEX MICROSCOPIC
Bilirubin Urine: NEGATIVE
Glucose, UA: NEGATIVE mg/dL
Hgb urine dipstick: NEGATIVE
Ketones, ur: NEGATIVE mg/dL
Leukocytes,Ua: NEGATIVE
Nitrite: NEGATIVE
Protein, ur: 30 mg/dL — AB
Specific Gravity, Urine: 1.011 (ref 1.005–1.030)
pH: 6 (ref 5.0–8.0)

## 2018-12-10 LAB — COMPREHENSIVE METABOLIC PANEL
ALT: 19 U/L (ref 0–44)
AST: 21 U/L (ref 15–41)
Albumin: 4.1 g/dL (ref 3.5–5.0)
Alkaline Phosphatase: 178 U/L — ABNORMAL HIGH (ref 38–126)
Anion gap: 9 (ref 5–15)
BUN: 25 mg/dL — ABNORMAL HIGH (ref 8–23)
CO2: 29 mmol/L (ref 22–32)
Calcium: 9.4 mg/dL (ref 8.9–10.3)
Chloride: 108 mmol/L (ref 98–111)
Creatinine, Ser: 1.28 mg/dL — ABNORMAL HIGH (ref 0.61–1.24)
GFR calc Af Amer: 55 mL/min — ABNORMAL LOW (ref >60)
GFR calc non Af Amer: 47 mL/min — ABNORMAL LOW (ref >60)
Glucose, Bld: 95 mg/dL (ref 70–99)
Potassium: 3.2 mmol/L — ABNORMAL LOW (ref 3.5–5.1)
Sodium: 146 mmol/L — ABNORMAL HIGH (ref 135–145)
Total Bilirubin: 1 mg/dL (ref 0.3–1.2)
Total Protein: 7.9 g/dL (ref 6.5–8.1)

## 2018-12-10 LAB — MAGNESIUM: Magnesium: 2.3 mg/dL (ref 1.7–2.4)

## 2018-12-10 LAB — BRAIN NATRIURETIC PEPTIDE: B Natriuretic Peptide: 1713 pg/mL — ABNORMAL HIGH (ref 0.0–100.0)

## 2018-12-10 MED ORDER — ONDANSETRON HCL 4 MG/2ML IJ SOLN: 4.0000 mg | Freq: Four times a day (QID) | INTRAMUSCULAR | Status: DC | PRN

## 2018-12-10 MED ORDER — FEBUXOSTAT 40 MG PO TABS
40.0000 mg | ORAL_TABLET | Freq: Every day | ORAL | Status: DC
  Administered 2018-12-10 – 2018-12-12 (×3): 40 mg via ORAL
  Filled 2018-12-10 (×3): qty 1

## 2018-12-10 MED ORDER — SIMVASTATIN 10 MG PO TABS
10.0000 mg | ORAL_TABLET | Freq: Every day | ORAL | Status: DC
  Administered 2018-12-10 – 2018-12-11 (×2): 10 mg via ORAL
  Filled 2018-12-10 (×2): qty 1

## 2018-12-10 MED ORDER — FUROSEMIDE 10 MG/ML IJ SOLN
40.0000 mg | Freq: Once | INTRAMUSCULAR | Status: AC
  Administered 2018-12-11: 40 mg via INTRAVENOUS
  Filled 2018-12-10: qty 4

## 2018-12-10 MED ORDER — WARFARIN - PHARMACIST DOSING INPATIENT
Freq: Every day | Status: DC
  Administered 2018-12-10 – 2018-12-11 (×2)

## 2018-12-10 MED ORDER — ACETAMINOPHEN 325 MG PO TABS: 650.0000 mg | ORAL_TABLET | Freq: Four times a day (QID) | ORAL | Status: DC | PRN

## 2018-12-10 MED ORDER — HYDRALAZINE HCL 20 MG/ML IJ SOLN: 5.0000 mg | INTRAMUSCULAR | Status: DC | PRN

## 2018-12-10 MED ORDER — METOPROLOL SUCCINATE ER 50 MG PO TB24
50.0000 mg | ORAL_TABLET | Freq: Every morning | ORAL | Status: DC
  Administered 2018-12-11 – 2018-12-12 (×2): 50 mg via ORAL
  Filled 2018-12-10 (×2): qty 1

## 2018-12-10 MED ORDER — PANTOPRAZOLE SODIUM 40 MG PO TBEC
40.0000 mg | DELAYED_RELEASE_TABLET | Freq: Every day | ORAL | Status: DC
  Administered 2018-12-10 – 2018-12-12 (×3): 40 mg via ORAL
  Filled 2018-12-10 (×3): qty 1

## 2018-12-10 MED ORDER — GABAPENTIN 100 MG PO CAPS
200.0000 mg | ORAL_CAPSULE | Freq: Two times a day (BID) | ORAL | Status: DC
  Administered 2018-12-10 – 2018-12-12 (×4): 200 mg via ORAL
  Filled 2018-12-10 (×4): qty 2

## 2018-12-10 MED ORDER — AEROCHAMBER PLUS FLO-VU MEDIUM MISC
1.0000 | Freq: Once | Status: DC
  Filled 2018-12-10: qty 1

## 2018-12-10 MED ORDER — IPRATROPIUM-ALBUTEROL 0.5-2.5 (3) MG/3ML IN SOLN
3.0000 mL | Freq: Four times a day (QID) | RESPIRATORY_TRACT | Status: DC
  Administered 2018-12-10 – 2018-12-12 (×5): 3 mL via RESPIRATORY_TRACT
  Filled 2018-12-10 (×5): qty 3

## 2018-12-10 MED ORDER — ACETAMINOPHEN 650 MG RE SUPP: 650.0000 mg | Freq: Four times a day (QID) | RECTAL | Status: DC | PRN

## 2018-12-10 MED ORDER — POTASSIUM CHLORIDE CRYS ER 20 MEQ PO TBCR
20.0000 meq | EXTENDED_RELEASE_TABLET | Freq: Once | ORAL | Status: AC
  Administered 2018-12-10: 20 meq via ORAL
  Filled 2018-12-10: qty 1

## 2018-12-10 MED ORDER — SERTRALINE HCL 50 MG PO TABS
50.0000 mg | ORAL_TABLET | Freq: Every morning | ORAL | Status: DC
  Administered 2018-12-11 – 2018-12-12 (×2): 50 mg via ORAL
  Filled 2018-12-10 (×2): qty 1

## 2018-12-10 MED ORDER — FLUTICASONE PROPIONATE 50 MCG/ACT NA SUSP
1.0000 | Freq: Every day | NASAL | Status: DC
  Administered 2018-12-10 – 2018-12-11 (×2): 1 via NASAL
  Filled 2018-12-10: qty 16

## 2018-12-10 MED ORDER — DONEPEZIL HCL 5 MG PO TABS
10.0000 mg | ORAL_TABLET | Freq: Every day | ORAL | Status: DC
  Administered 2018-12-10 – 2018-12-11 (×2): 10 mg via ORAL
  Filled 2018-12-10 (×2): qty 2

## 2018-12-10 MED ORDER — ALBUTEROL SULFATE HFA 108 (90 BASE) MCG/ACT IN AERS
4.0000 | INHALATION_SPRAY | Freq: Once | RESPIRATORY_TRACT | Status: AC
  Administered 2018-12-10: 4 via RESPIRATORY_TRACT
  Filled 2018-12-10: qty 6.7

## 2018-12-10 MED ORDER — METHYLPREDNISOLONE SODIUM SUCC 40 MG IJ SOLR
40.0000 mg | Freq: Two times a day (BID) | INTRAMUSCULAR | Status: AC
  Administered 2018-12-10 – 2018-12-11 (×3): 40 mg via INTRAVENOUS
  Filled 2018-12-10 (×3): qty 1

## 2018-12-10 MED ORDER — ASPIRIN 81 MG PO CHEW
324.0000 mg | CHEWABLE_TABLET | Freq: Once | ORAL | Status: AC
  Administered 2018-12-10: 324 mg via ORAL
  Filled 2018-12-10: qty 4

## 2018-12-10 MED ORDER — POLYETHYLENE GLYCOL 3350 17 G PO PACK: 17.0000 g | PACK | Freq: Every day | ORAL | Status: DC | PRN

## 2018-12-10 MED ORDER — ONDANSETRON HCL 4 MG PO TABS: 4.0000 mg | ORAL_TABLET | Freq: Four times a day (QID) | ORAL | Status: DC | PRN

## 2018-12-10 MED ORDER — WARFARIN SODIUM 5 MG PO TABS
5.0000 mg | ORAL_TABLET | Freq: Once | ORAL | Status: AC
  Administered 2018-12-10: 5 mg via ORAL
  Filled 2018-12-10: qty 1

## 2018-12-10 MED ORDER — NITROGLYCERIN 0.4 MG SL SUBL
0.4000 mg | SUBLINGUAL_TABLET | Freq: Once | SUBLINGUAL | Status: AC
  Administered 2018-12-10: 0.4 mg via SUBLINGUAL
  Filled 2018-12-10: qty 1

## 2018-12-10 MED ORDER — MIRABEGRON ER 25 MG PO TB24
25.0000 mg | ORAL_TABLET | Freq: Every morning | ORAL | Status: DC
  Administered 2018-12-11 – 2018-12-12 (×2): 25 mg via ORAL
  Filled 2018-12-10 (×2): qty 1

## 2018-12-10 MED ORDER — ISOSORBIDE DINITRATE 20 MG PO TABS
30.0000 mg | ORAL_TABLET | Freq: Every day | ORAL | Status: DC
  Administered 2018-12-10 – 2018-12-12 (×3): 30 mg via ORAL
  Filled 2018-12-10 (×3): qty 2

## 2018-12-10 MED ORDER — FUROSEMIDE 10 MG/ML IJ SOLN
40.0000 mg | Freq: Once | INTRAMUSCULAR | Status: AC
  Administered 2018-12-10: 40 mg via INTRAVENOUS
  Filled 2018-12-10: qty 4

## 2018-12-10 MED ORDER — IPRATROPIUM-ALBUTEROL 0.5-2.5 (3) MG/3ML IN SOLN
3.0000 mL | RESPIRATORY_TRACT | Status: DC | PRN
  Filled 2018-12-10: qty 3

## 2018-12-10 NOTE — ED Notes (Signed)
Pt is Left AKA.

## 2018-12-10 NOTE — ED Notes (Signed)
Scott [name] took pts necklace home.  Personal clothing at bedside.

## 2018-12-10 NOTE — ED Provider Notes (Signed)
[facility] EMERGENCY DEPARTMENT Provider Note   CSN: [id number] Arrival date & time: 12/10/18  1213     History   Chief Complaint Chief Complaint  Patient presents with  . Shortness of Breath    HPI Scott [name] is a 85 y.o. male with a history of prior PE/DVT, atrial fibrillation, chronic anticoagulation on coumadin, CAD, COPD, EtOH abuse, hypertension, hyperlipidemia, PAD/PVD, CHF 35-40%, CKD, & Alzheimer disease who presents to the ED w/ his care giving for dyspnea & generalized weakness over the past 24-48 hours. Patient has hx of Alzheimer disease, able to provide some history- level 5 caveat applies. Per patient's caregiver who is his nephew patient has been complaining of not feeling well which is atypical for him. He has had issues with generalized weakness, generalized body aches, & intermittent dyspnea/chest tightness. He has dyspnea on exertion somewhat at baseline, but this has seemed much worse. They have been checking his oxygen at home when he seems to be working harder to breathe and it has been 85-88%- improving some with neb tx to 88-92%. Other than exertion & neb/inhalers no alleviating/aggravating factors. They have noted that the RLE is more edematous than usual (LLE s/p AKA). He has not had any known covid 19 exposures. Oral temp at home has been 99.5. Patient does not wear oxygen at home. Currently patient is without complaints, he states he is not having any chest pain.     HPI  Past Medical History:  Diagnosis Date  . Arthritis    "all over"  . Atrial fibrillation ([place])   . Benign localized prostatic hyperplasia with lower urinary tract symptoms (LUTS)   . Bradycardia 11/28/2015   Severe-HR in the 20s to 30s following intubation for urological procedure.  Marland Kitchen CAD- non obstructive disease by cath 3/14 cardiologist-  dr hochrein  . Chronic lower back pain   . COPD (chronic obstructive pulmonary disease) ([place])   . DDD (degenerative disc disease)   . Depression    . Dyspnea    w/ exertion and lying down (raise head)  . ETOH abuse   . GERD (gastroesophageal reflux disease)   . Glaucoma   . Glaucoma, both eyes   . History of bladder cancer urologist-- dr Jeffie Pollock   first dx 2012--  s/p TURBT's  . History of DVT of lower extremity 03/2013   bilateral   . History of pulmonary embolus (PE) 04/2013  . HTN (hypertension)   . Hyperlipidemia   . Hypertension   . LBBB (left bundle branch block)    chronic  . Non-ischemic cardiomyopathy ([place])    last echo 01-18-2017, ef 35-40%  . NSVT (nonsustained ventricular tachycardia) ([place])    a. NSVT 06/2012; NSVT also seen during 03/2013 adm. b. Med rx. Not candidate for ICD given adv age.  Marland Kitchen PAD (peripheral artery disease) ([place])   . 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 PVCs (premature ventricular contractions)   . PVD (peripheral vascular disease) (HCC)    Right ABI .75, Left .78 (2006)  . Syncope 06/2012   a. Felt to be postural syncope related to diuretics 06/2012.  Marland Kitchen Systolic and diastolic CHF, chronic (HCC) cardiologsit-  dr hochrein   a. NICM - patent cors 06/2012, EF 40% at that time. b. 03/2013 eval: EF 20-25%.  . Vision loss, left eye    due to glaucoma  . Wears glasses     Patient  Active Problem List   Diagnosis Date Noted  . Alzheimer disease ([place]) 09/07/2018  . SIRS (systemic inflammatory response syndrome) ([place]) 06/22/2018  . Paroxysmal atrial fibrillation ([place]) 01/18/2017  . PVC (pulmonary venous congestion) 01/18/2017  . Thoracic aortic atherosclerosis ([place]) 11/26/2016  . Thrombocytopenia ([place]) 08/04/2016  . Chronic kidney disease (CKD) stage G3a/A1, moderately decreased glomerular filtration rate (GFR) between 45-59 mL/min/1.73 square meter and albuminuria creatinine ratio less than 30 mg/g (HCC) 08/04/2016  . Essential hypertension 11/29/2015  . Bradycardia 11/28/2015  . Malignant neoplasm of  lateral wall of urinary bladder ([place]) 07/10/2015  . Status post above knee amputation of left lower extremity 10/18/2014  . Tachyarrhythmia 10/05/2014  . Aspiration pneumonia ([place]) 10/05/2014  . Lower urinary tract infectious disease 10/03/2014  . Sepsis due to other etiology ([place]) 10/03/2014  . Gangrene of toe ([place]) 10/03/2014  . Ischemic ulcer of left foot ([place]) 10/03/2014  . Atherosclerotic PVD with ulceration ([place]) 10/03/2014  . Atrial fibrillation with RVR ([place]) 10/03/2014  . Hyperkalemia 10/03/2014  . PAD (peripheral artery disease) ([place]) 09/24/2014  . NSTEMI (non-ST elevated myocardial infarction) ([place]) 10/09/2013  . High risk medication use 05/17/2013  . BPH (benign prostatic hyperplasia) 05/17/2013  . Vitamin D deficiency 05/17/2013  . History of pulmonary embolism (2014) 04/17/2013  . NSVT (nonsustained ventricular tachycardia)- not an ICD candidate 04/17/2013  . NICM (nonischemic cardiomyopathy)- EF 20-25% by Echo 04/12/13 04/17/2013  . Chronic combined systolic and diastolic heart failure ([place]) 03/15/2013  . DVT of lower extremity, bilateral ([place]) 03/15/2013  . Fatigue 11/02/2012  . Constipation 11/02/2012  . Syncope 06/21/2012  . Bladder cancer ([place]) 03/15/2012  . Benign localized hyperplasia of prostate with urinary obstruction 03/15/2012  . Hyperlipidemia   . PVCs (premature ventricular contractions)   . CAD- non obstructive disease by cath 3/14     Past Surgical History:  Procedure Laterality Date  . AMPUTATION Left 10/16/2014   Procedure: AMPUTATION ABOVE KNEE- LEFT;  Surgeon: Mal Misty, MD;  Location: [place];  Service: Vascular;  Laterality: Left;  . CARDIAC CATHETERIZATION  05-11-2004  DR [facility]   MINIMAL CORANARY PLAQUE/ NORMAL LVF/ EF 55%/  NON-OBSTRUCTIVE LAD 25%  . CARDIAC CATHETERIZATION  06/18/2012   dr hochrein   mild luminal irregularities of coronaries, ef 45-50%  . CARDIOVASCULAR STRESS TEST  10-15-2010   LOW RISK NUCLEAR STUDY/ NO EVIDENCE OF  ISCHEMIA/ NORMAL EF  . CATARACT EXTRACTION W/ INTRAOCULAR LENS  IMPLANT, BILATERAL Bilateral   . CYSTOSCOPY  10/07/2011   Procedure: CYSTOSCOPY;  Surgeon: Malka So, MD;  Location: [facility];  Service: Urology;  Laterality: N/A;  . CYSTOSCOPY N/A 10/20/2018   Procedure: CYSTOSCOPY WITH FULGURATION OF PROSTATIC URETHRAL TUMOR;  Surgeon: [name], MD;  Location: [facility];  Service: Urology;  Laterality: N/A;  . CYSTOSCOPY WITH BIOPSY  03/14/2012   Procedure: CYSTOSCOPY WITH BIOPSY;  Surgeon: Malka So, MD;  Location: [facility];  Service: Urology;  Laterality: N/A;  WITH FULGURATION  . CYSTOSCOPY WITH BIOPSY N/A 12/09/2016   Procedure: CYSTOSCOPY WITH BIOPSY AND FULGURATION;  Surgeon: [name], MD;  Location: [facility];  Service: Urology;  Laterality: N/A;  . CYSTOSCOPY WITH BIOPSY N/A 12/20/2017   Procedure: CYSTOSCOPY WITH BIOPSY WITH FULGURATION;  Surgeon: [name], MD;  Location: [facility];  Service: Urology;  Laterality: N/A;  . CYSTOSCOPY WITH BIOPSY N/A 06/09/2018   Procedure: CYSTOSCOPY WITH BIOPSY AND FULGURATION;  Surgeon: [name], MD;  Location: [facility];  Service: Urology;  Laterality: N/A;  . CYSTOSCOPY WITH FULGERATION N/A 01/20/2016   Procedure: CYSTOSCOPY, BIOPSY WITH FULGERATION OF URTHRAL TUMOR;  Surgeon: [name], MD;  Location[facility];  Service: Urology;  Laterality: N/A;  . CYSTOSCOPY WITH FULGERATION N/A 06/01/2016   Procedure: CYSTOSCOPY WITH FULGERATION urethral tumors and bladder neck;  Surgeon: [name], MD;  Location: [facility];  Service: Urology;  Laterality: N/A;  . SHOULDER SURGERY Left 1970's  . TONSILLECTOMY    . TRANSTHORACIC ECHOCARDIOGRAM  01-18-2017   dr hochrein   moderate LVH, ef 35-40%, diffuse hypokinesis/  trivial AR and MR/  mild TR  . TRANSURETHRAL RESECTION OF BLADDER TUMOR  10/07/2011   Procedure: TRANSURETHRAL RESECTION OF BLADDER TUMOR (TURBT);  Surgeon: Malka So, MD;  Location[facility];  Service: Urology;   Laterality: N/A;  . TRANSURETHRAL RESECTION OF BLADDER TUMOR N/A 07/10/2015   Procedure: TRANSURETHRAL RESECTION OF BLADDER TUMOR (TURBT), CYSTOSCOPY ;  Surgeon: [name], MD;  Location: [facility];  Service: Urology;  Laterality: N/A;  . TRANSURETHRAL RESECTION OF PROSTATE  03/14/2012   Procedure: TRANSURETHRAL RESECTION OF THE PROSTATE WITH GYRUS INSTRUMENTS;  Surgeon: Malka So, MD;  Location: [facility];  Service: Urology;  Laterality: N/A;        Home Medications    Prior to Admission medications   Medication Sig Start Date End Date Taking? Authorizing Provider  albuterol (PROVENTIL) (2.5 MG/3ML) 0.083% nebulizer solution NEBULIZE 1 VIAL EVERY 6 HOURS AS NEEDED FOR WHEEZING OR SHORTNESS OF BREATH Patient taking differently: Take 2.5 mg by nebulization every 6 (six) hours as needed for wheezing or shortness of breath.  09/07/18   Dettinger, Fransisca Kaufmann, MD  COMBIVENT RESPIMAT 20-100 MCG/ACT AERS respimat 1 PUFF EVERY 4 HOURS AS NEEDED FOR COUGH, WHEEZING, OR SHORTNESS OF BREATH Patient not taking: Reported on 10/11/2018 08/23/18   Dettinger, Fransisca Kaufmann, MD  donepezil (ARICEPT) 10 MG tablet TAKE ONE TABLET AT BEDTIME 11/10/18   Dettinger, Fransisca Kaufmann, MD  febuxostat (ULORIC) 40 MG tablet TAKE 1 TABLET DAILY 11/10/18   Dettinger, Fransisca Kaufmann, MD  ferrous sulfate 325 (65 FE) MG tablet TAKE (1) TABLET DAILY WITH BREAKFAST. Patient not taking: Reported on 10/11/2018 08/23/18   Dettinger, Fransisca Kaufmann, MD  fluticasone [facility]) 50 MCG/ACT nasal spray Place 1 spray into both nostrils daily as needed for allergies or rhinitis. Patient taking differently: Place 1 spray into both nostrils at bedtime.  09/07/18   Dettinger, Fransisca Kaufmann, MD  folic acid (FOLVITE) 1 MG tablet TAKE 1 TABLET DAILY 11/23/18   Dettinger, Fransisca Kaufmann, MD  furosemide (LASIX) 40 MG tablet Take 1 tablet (40 mg total) by mouth 2 (two) times daily. 12/05/18   Dettinger, Fransisca Kaufmann, MD  gabapentin (NEURONTIN) 100 MG capsule TAKE 2 CAPSULES TWICE A DAY 11/10/18    Dettinger, Fransisca Kaufmann, MD  ipratropium (ATROVENT) 0.02 % nebulizer solution Take 0.5 mg by nebulization every 6 (six) hours as needed for wheezing or shortness of breath.    [provider]  isosorbide dinitrate (ISORDIL) 30 MG tablet TAKE 1 TABLET ONCE A DAY Patient taking differently: Take 30 mg by mouth every morning.  03/17/18   Chipper Herb, MD  memantine [facility] TITRATION PAK) tablet pack 5 mg/day for =1 week; 5 mg twice daily for =1 week; 15 mg/day given in 5 mg and 10 mg separated doses for =1 week; then 10 mg twice daily 09/07/18   Dettinger, Fransisca Kaufmann, MD  metoprolol succinate (TOPROL-XL) 50 MG 24 hr  tablet Take 1 tablet (50 mg total) by mouth every morning. (Appt w/ PCP for blood pressure) 08/02/18   Dettinger, Fransisca Kaufmann, MD  MYRBETRIQ 25 MG TB24 tablet Take 25 mg by mouth every morning.  09/20/16   [provider]  nitroGLYCERIN (NITROSTAT) 0.4 MG SL tablet Place 1 tablet (0.4 mg total) under the tongue every 5 (five) minutes as needed for chest pain (up to 3 doses). Patient not taking: Reported on 10/11/2018 04/19/13   Charlie Pitter, PA-C  pantoprazole (PROTONIX) 40 MG tablet TAKE 1 TABLET DAILY 11/23/18   Evelina Dun A, FNP  PROAIR HFA 108 (90 Base) MCG/ACT inhaler 2 PUFFS EVERY 6 HOURS AS NEEDED FOR WHEEZING OR SHORTNESS OF BREATH Patient taking differently: Inhale 2 puffs into the lungs every 6 (six) hours as needed for wheezing or shortness of breath.  05/02/18   Dettinger, Fransisca Kaufmann, MD  sertraline (ZOLOFT) 50 MG tablet TAKE 1 TABLET IN THE MORNING 11/23/18   Dettinger, Fransisca Kaufmann, MD  simvastatin (ZOCOR) 10 MG tablet Take 1 tablet (10 mg total) by mouth at bedtime. 11/30/18   Dettinger, Fransisca Kaufmann, MD  sulfamethoxazole-trimethoprim (BACTRIM DS) 800-160 MG tablet Take 1 tablet by mouth 2 (two) times daily. 10/31/18   Hassell Done Mary-Margaret, FNP  warfarin (COUMADIN) 2.5 MG tablet Take 1-2 tablets (2.5-5 mg total) by mouth See admin instructions. Take 2.5 mg by mouth daily except  Mondays and Fridays take 5 mg 10/11/18   Dettinger, Fransisca Kaufmann, MD    Family History Family History  Problem Relation Age of Onset  . Hypertension Mother   . Arthritis Mother   . Lung cancer Sister   . Deep vein thrombosis Father   . Early death Brother     Social History Social History   Tobacco Use  . Smoking status: Former Smoker    Packs/day: 1.00    Years: 35.00    Pack years: 35.00    Types: Cigarettes    Start date: 04/12/1942    Quit date: 04/13/1975    Years since quitting: 43.6  . Smokeless tobacco: Never Used  Substance Use Topics  . Alcohol use: Yes    Alcohol/week: 7.0 standard drinks    Types: 7 Cans of beer per week  . Drug use: No     Allergies   Lipitor [atorvastatin]   Review of Systems Review of Systems  Unable to perform ROS: Dementia    Physical Exam Updated Vital Signs BP (!) 174/114 (BP Location: Left Arm)   Pulse 69   Temp 98.2 F (36.8 C) (Oral)   Resp 18   Ht 5' 7"  (1.702 m)   Wt 72.6 kg   SpO2 92%   BMI 25.06 kg/m   Physical Exam Vitals signs and nursing note reviewed.  Constitutional:      Appearance: He is not toxic-appearing.  HENT:     Head: Normocephalic and atraumatic.     Mouth/Throat:     Mouth: Mucous membranes are moist.  Eyes:     General:        Right eye: No discharge.        Left eye: No discharge.     Conjunctiva/sclera: Conjunctivae normal.  Neck:     Musculoskeletal: Neck supple. No neck rigidity.  Cardiovascular:     Rate and Rhythm: Normal rate and regular rhythm.  Pulmonary:     Breath sounds: Wheezing (minimal @ bases w/ decreased breath sounds) present. No rhonchi or rales.     Comments:  SpO2 88-92% on RA. Improved w/ application of 2L via [place] Abdominal:     General: There is no distension.     Palpations: Abdomen is soft.     Tenderness: There is no abdominal tenderness.  Musculoskeletal:     Right lower leg: He exhibits no tenderness. Edema (1+ to the lower leg without significant overlying  erythema or calf tenderness) present.     Comments: S/p LLE AKA.   Skin:    General: Skin is warm and dry.     Findings: No rash.  Neurological:     Mental Status: He is alert.     Comments: Clear speech.   Psychiatric:        Behavior: Behavior normal.    ED Treatments / Results  Labs (all labs ordered are listed, but only abnormal results are displayed) Labs Reviewed  PROTIME-INR - Abnormal; Notable for the following components:      Result Value   Prothrombin Time 19.3 (*)    INR 1.7 (*)    All other components within normal limits  COMPREHENSIVE METABOLIC PANEL - Abnormal; Notable for the following components:   Sodium 146 (*)    Potassium 3.2 (*)    BUN 25 (*)    Creatinine, Ser 1.28 (*)    Alkaline Phosphatase 178 (*)    GFR calc non Af Amer 47 (*)    GFR calc Af Amer 55 (*)    All other components within normal limits  BRAIN NATRIURETIC PEPTIDE - Abnormal; Notable for the following components:   B Natriuretic Peptide 1,713.0 (*)    All other components within normal limits  TROPONIN I (HIGH SENSITIVITY) - Abnormal; Notable for the following components:   Troponin I (High Sensitivity) 29 (*)    All other components within normal limits  SARS CORONAVIRUS 2 (HOSPITAL ORDER, [facility])  CBC  URINALYSIS, ROUTINE W REFLEX MICROSCOPIC  TROPONIN I (HIGH SENSITIVITY)    EKG EKG Interpretation  Date/Time:  Sunday December 10 2018 12:20:58 EDT Ventricular Rate:  65 PR Interval:  242 QRS Duration: 140 QT Interval:  444 QTC Calculation: 461 R Axis:   -11 Text Interpretation:  Sinus rhythm with 1st degree A-V block with occasional Premature ventricular complexes Non-specific intra-ventricular conduction block Cannot rule out Anterior infarct , age undetermined When compared with ECG of 07/04/2018 No significant change was found Confirmed by Francine Graven [phone]) on 12/10/2018 1:00:24 PM   Radiology Dg Chest Port 1 View  Result Date:  12/10/2018 CLINICAL DATA:  Shortness of breath. EXAM: PORTABLE CHEST 1 VIEW COMPARISON:  Sep 07, 2018 FINDINGS: Stable cardiomegaly. The cardiomediastinal silhouette is stable. No pneumothorax. No nodules or masses. No focal infiltrates or overt edema. IMPRESSION: No active disease. Electronically Signed   By: Dorise Bullion III M.D   On: 12/10/2018 13:31    Procedures Procedures (including critical care time)  Medications Ordered in ED Medications  AeroChamber Plus Flo-Vu Medium MISC 1 each (has no administration in time range)  aspirin chewable tablet 324 mg (324 mg Oral Given 12/10/18 1335)  nitroGLYCERIN (NITROSTAT) SL tablet 0.4 mg (0.4 mg Sublingual Given 12/10/18 1336)  albuterol (VENTOLIN HFA) 108 (90 Base) MCG/ACT inhaler 4 puff (4 puffs Inhalation Given 12/10/18 1400)  furosemide (LASIX) injection 40 mg (40 mg Intravenous Given 12/10/18 1449)  potassium chloride SA (K-DUR) CR tablet 20 mEq (20 mEq Oral Given 12/10/18 1453)     Initial Impression / Assessment and Plan / ED Course  I have reviewed the triage vital signs and the nursing notes.  Pertinent labs & imaging results that were available during my care of the patient were reviewed by me and considered in my medical decision making (see chart for details).   Patient presents to the ED w/ his nephew whom is his caregiver for generalized weakness, dyspnea, & chest tightness. He is not toxic appearing, notably hypertensive with mild hypoxia requiring 2L via [place]. Decreased breath sounds @ the bases w/ mild expiratory wheeze noted. 1+ pitting edema to the RLE, difficult to assess symmetry given s/p LLE AKA. DDX: CHF exacerbation, COPD exacerbation, bacterial pneumonia, COVID19, ACS, PE. Discussed w/ Dr. Thurnell Garbe- recommends NTG, ASA, & albuterol inhaler. Anticipate admission.   EKG: No significant change from prior. No STEMI CBC:No leukocytosis or anemia CMP: Mild hypokalemia and hyponatremia, alk phos mildly up.  Renal function appears  at baseline. PT/INR: Somewhat subtherapeutic Trop: Mildly elevated @ 29 BNP: Elevated @ 1713.0 COVID: Pending.  CXR: No active disease  BP mildly improved status post nitroglycerin tablet.  Patient remains on 2 L of oxygen saturating well. Given his 1+ edema to the right lower extremity, elevated BNP, and oxygen requirement feel this is likely primarily a CHF exacerbation, there may be a degree of COPD contributing as well- provided albuterol inhaler in the ED. Lasix 40 mg IV ordered. .  Will discuss w/ hospitalist service.   15:27: CONSULT: Discussed with hospitalist Dr. Denton Brick who accepts admission.   Findings and plan of care discussed with supervising physician Dr. Thurnell Garbe who has provided guidance in evaluation/management/disposition & is in agreement.   Final Clinical Impressions(s) / ED Diagnoses   Final diagnoses:  Acute respiratory failure with hypoxia [facility])    ED Discharge Orders    None       Amaryllis Dyke, PA-C 12/10/18 [place], [place], DO 12/14/18 1543

## 2018-12-10 NOTE — ED Triage Notes (Signed)
Pt brought in by his caretaker for chest pain and shortness of breath. Caretaker states his o2 sat has been in the high 80s since yesterday with complaint of chest tightness and leg swelling. States it is very unusual for him to complain of anything.

## 2018-12-10 NOTE — ED Notes (Signed)
PA at bedside.

## 2018-12-10 NOTE — Progress Notes (Signed)
Notified by telemetry patient had "ten beat run of PVC's." Patient resting in bed. Vitals stable. Patient denies chest pain or shortness of breath at this time. Will continue to monitor patient. Mid level notified.

## 2018-12-10 NOTE — ED Notes (Signed)
Pt was informed we need urine sample, urinal at bedside. 

## 2018-12-10 NOTE — ED Notes (Signed)
Family/Caregiver at bedside.  Pericare done by caregiver.

## 2018-12-10 NOTE — Progress Notes (Signed)
ANTICOAGULATION CONSULT NOTE - Initial Consult  Pharmacy Consult for warfarin Indication: VTE treatment  Allergies  Allergen Reactions  . Lipitor [Atorvastatin] Other (See Comments)    Unknown rxn per pt    Patient Measurements: Height: 5\' 7"  (170.2 cm) Weight: 156 lb 1.4 oz (70.8 kg) IBW/kg (Calculated) : 66.1   Vital Signs: Temp: 97.9 F (36.6 C) (08/30 1840) Temp Source: Oral (08/30 1840) BP: 184/91 (08/30 1840) Pulse Rate: 82 (08/30 1943)  Labs: Recent Labs    12/10/18 1249 12/10/18 1251 12/10/18 1429  HGB 14.4  --   --   HCT 46.5  --   --   PLT 180  --   --   LABPROT 19.3*  --   --   INR 1.7*  --   --   CREATININE  --  1.28*  --   TROPONINIHS 29*  --  28*    Estimated Creatinine Clearance: 32.3 mL/min (A) (by C-G formula based on SCr of 1.28 mg/dL (H)).   Medical History: Past Medical History:  Diagnosis Date  . Arthritis    "all over"  . Atrial fibrillation ([place])   . Benign localized prostatic hyperplasia with lower urinary tract symptoms (LUTS)   . Bradycardia 11/28/2015   Severe-HR in the 20s to 30s following intubation for urological procedure.  Marland Kitchen CAD- non obstructive disease by cath 3/14 cardiologist-  dr hochrein  . Chronic lower back pain   . COPD (chronic obstructive pulmonary disease) ([place])   . DDD (degenerative disc disease)   . Depression   . Dyspnea    w/ exertion and lying down (raise head)  . ETOH abuse   . GERD (gastroesophageal reflux disease)   . Glaucoma   . Glaucoma, both eyes   . History of bladder cancer urologist-- dr Jeffie Pollock   first dx 2012--  s/p TURBT's  . History of DVT of lower extremity 03/2013   bilateral   . History of pulmonary embolus (PE) 04/2013  . HTN (hypertension)   . Hyperlipidemia   . Hypertension   . LBBB (left bundle branch block)    chronic  . Non-ischemic cardiomyopathy ([place])    last echo 01-18-2017, ef 35-40%  . NSVT (nonsustained ventricular tachycardia) ([place])    a. NSVT 06/2012; NSVT also seen  during 03/2013 adm. b. Med rx. Not candidate for ICD given adv age.  Marland Kitchen PAD (peripheral artery disease) ([place])   . 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 PVCs (premature ventricular contractions)   . PVD (peripheral vascular disease) (HCC)    Right ABI .75, Left .78 (2006)  . Syncope 06/2012   a. Felt to be postural syncope related to diuretics 06/2012.  Marland Kitchen Systolic and diastolic CHF, chronic (HCC) cardiologsit-  dr hochrein   a. NICM - patent cors 06/2012, EF 40% at that time. b. 03/2013 eval: EF 20-25%.  . Vision loss, left eye    due to glaucoma  . Wears glasses     Medications:  Medications Prior to Admission  Medication Sig Dispense Refill Last Dose  . albuterol (PROVENTIL) (2.5 MG/3ML) 0.083% nebulizer solution NEBULIZE 1 VIAL EVERY 6 HOURS AS NEEDED FOR WHEEZING OR SHORTNESS OF BREATH (Patient taking differently: Take 2.5 mg by nebulization every 6 (six) hours as needed for wheezing or shortness of breath. ) 180 mL 3 12/09/2018 at Unknown time  . donepezil (ARICEPT) 10 MG tablet TAKE ONE TABLET AT  BEDTIME 30 tablet 2 12/09/2018 at Unknown time  . febuxostat (ULORIC) 40 MG tablet TAKE 1 TABLET DAILY 30 tablet 1 12/09/2018 at Unknown time  . fluticasone (FLONASE) 50 MCG/ACT nasal spray Place 1 spray into both nostrils daily as needed for allergies or rhinitis. (Patient taking differently: Place 1 spray into both nostrils at bedtime. ) 16 g 2 12/09/2018 at Unknown time  . folic acid (FOLVITE) 1 MG tablet TAKE 1 TABLET DAILY 30 tablet 0 12/09/2018 at Unknown time  . furosemide (LASIX) 40 MG tablet Take 1 tablet (40 mg total) by mouth 2 (two) times daily. 60 tablet 0 12/09/2018 at Unknown time  . gabapentin (NEURONTIN) 100 MG capsule TAKE 2 CAPSULES TWICE A DAY 120 capsule 2 12/09/2018 at Unknown time  . ipratropium (ATROVENT) 0.02 % nebulizer solution Take 0.5 mg by nebulization every 6 (six) hours as  needed for wheezing or shortness of breath.   12/09/2018 at Unknown time  . isosorbide dinitrate (ISORDIL) 30 MG tablet TAKE 1 TABLET ONCE A DAY (Patient taking differently: Take 30 mg by mouth every morning. ) 30 tablet 11 12/09/2018 at Unknown time  . metoprolol succinate (TOPROL-XL) 50 MG 24 hr tablet Take 1 tablet (50 mg total) by mouth every morning. (Appt w/ PCP for blood pressure) 90 tablet 3 12/09/2018 at 900  . MYRBETRIQ 25 MG TB24 tablet Take 25 mg by mouth every morning.    12/09/2018 at Unknown time  . pantoprazole (PROTONIX) 40 MG tablet TAKE 1 TABLET DAILY 30 tablet 0 12/09/2018 at Unknown time  . PROAIR HFA 108 (90 Base) MCG/ACT inhaler 2 PUFFS EVERY 6 HOURS AS NEEDED FOR WHEEZING OR SHORTNESS OF BREATH (Patient taking differently: Inhale 2 puffs into the lungs every 6 (six) hours as needed for wheezing or shortness of breath. ) 8.5 g 4   . sertraline (ZOLOFT) 50 MG tablet TAKE 1 TABLET IN THE MORNING 30 tablet 0 12/09/2018 at Unknown time  . simvastatin (ZOCOR) 10 MG tablet Take 1 tablet (10 mg total) by mouth at bedtime. 90 tablet 1 12/09/2018 at Unknown time  . warfarin (COUMADIN) 2.5 MG tablet Take 1-2 tablets (2.5-5 mg total) by mouth See admin instructions. Take 2.5 mg by mouth daily except Mondays and Fridays take 5 mg 40 tablet 3 12/09/2018 at 900  . COMBIVENT RESPIMAT 20-100 MCG/ACT AERS respimat 1 PUFF EVERY 4 HOURS AS NEEDED FOR COUGH, WHEEZING, OR SHORTNESS OF BREATH (Patient not taking: Reported on 10/11/2018) 4 g 0 Not Taking at Unknown time  . ferrous sulfate 325 (65 FE) MG tablet TAKE (1) TABLET DAILY WITH BREAKFAST. (Patient not taking: Reported on 10/11/2018) 30 tablet 0 Not Taking at Unknown time  . memantine (NAMENDA TITRATION PAK) tablet pack 5 mg/day for =1 week; 5 mg twice daily for =1 week; 15 mg/day given in 5 mg and 10 mg separated doses for =1 week; then 10 mg twice daily (Patient not taking: Reported on 12/10/2018) 49 tablet 12 Not Taking at Unknown time  . nitroGLYCERIN  (NITROSTAT) 0.4 MG SL tablet Place 1 tablet (0.4 mg total) under the tongue every 5 (five) minutes as needed for chest pain (up to 3 doses). (Patient not taking: Reported on 10/11/2018) 25 tablet 3 Not Taking at Unknown time  . sulfamethoxazole-trimethoprim (BACTRIM DS) 800-160 MG tablet Take 1 tablet by mouth 2 (two) times daily. (Patient not taking: Reported on 12/10/2018) 20 tablet 0 Completed Course at Unknown time    Assessment: Pharmacy consulted to dose warfarin in  patient with history of DVT, PE, and atrial fibrillation.  Patient has listed dose of 5 mg on Monday and Friday and 2.5 mg ROW.  INR on admission is 1.7.  Goal of Therapy:  INR 2-3 Monitor platelets by anticoagulation protocol: Yes   Plan:  Warfarin 5 mg x 1 dose. Monitor daily INR and s/s of bleeding.  Revonda Standard Taryll Reichenberger 12/10/2018,9:05 PM

## 2018-12-10 NOTE — H&P (Addendum)
History and Physical    Scott [name] [id number] DOB: 1921-11-17 DOA: 12/10/2018  PCP: Dettinger, Fransisca Kaufmann, MD   Patient coming from: Home  I have personally briefly reviewed patient's old medical records in [place]  Chief Complaint: Generalized weakness,  SOB  HPI: Scott [name] is a 85 y.o. male with medical history significant for systolic and diastolic CHF, PE and DVT on chronic anticoagulation, atrial fibrillation, hypertension, CKD 3, COPD, bladder cancer, coronary artery disease, who was brought to the ED with 1 to 2 days of generalized weakness and worsening difficulty breathing.  Patient is awake and alert to the time of my evaluation and answers questions appropriately, caretakers- nephew and patient's son Scott [name] who is healthcare power of attorney on the phone and assist with the history.   At baseline patient has difficulty breathing with exertion, but this has worsened over the past 1 to 2 days and is present at rest.  O2 sats recorded at home were in the high 80s.  He is not on home O2.  Patient has also had intermittent lower extremity swelling, but this is not worse today compared to baseline.  They report compliance with warfarin and Lasix.  No cough.  No fevers.  Patient also reported chest tightness with difficulty breathing, but he denies any chest pain.  He denies pain with urination, but patient son reports he has had gradual worsening of his chronic urinary incontinence.  Patient son also reports intermittent mild confusion.  ED Course: O2 sats down to 89% on room air patient improved with 2 L nasal cannula, blood pressure systolic [id number] to [id number].  It safe.  Portable chest x-ray without active disease.  WBC 4.1.  Potassium 3.2.  Stable creatinine 1.38.  High-sensitivity troponin 29>> 28.  BNP 1713.  1.7.  IV Lasix 401 given.  Albuterol neb also given.  Hospitalist to admit for probable CHF exacerbation and/ or COPD.  Review of Systems: As per HPI all other systems  reviewed and negative.  Past Medical History:  Diagnosis Date  . Arthritis    "all over"  . Atrial fibrillation ([place])   . Benign localized prostatic hyperplasia with lower urinary tract symptoms (LUTS)   . Bradycardia 11/28/2015   Severe-HR in the 20s to 30s following intubation for urological procedure.  Marland Kitchen CAD- non obstructive disease by cath 3/14 cardiologist-  dr hochrein  . Chronic lower back pain   . COPD (chronic obstructive pulmonary disease) ([place])   . DDD (degenerative disc disease)   . Depression   . Dyspnea    w/ exertion and lying down (raise head)  . ETOH abuse   . GERD (gastroesophageal reflux disease)   . Glaucoma   . Glaucoma, both eyes   . History of bladder cancer urologist-- dr Jeffie Pollock   first dx 2012--  s/p TURBT's  . History of DVT of lower extremity 03/2013   bilateral   . History of pulmonary embolus (PE) 04/2013  . HTN (hypertension)   . Hyperlipidemia   . Hypertension   . LBBB (left bundle branch block)    chronic  . Non-ischemic cardiomyopathy ([place])    last echo 01-18-2017, ef 35-40%  . NSVT (nonsustained ventricular tachycardia) ([place])    a. NSVT 06/2012; NSVT also seen during 03/2013 adm. b. Med rx. Not candidate for ICD given adv age.  Marland Kitchen PAD (peripheral artery disease) ([place])   . Popliteal artery aneurysm, bilateral (HCC)    DOCUMENTED CHRONIC PARTIAL OCCLUSION--  PT DENIES  CLAUDICATION OR ANY OTHER SYMPTOMS  . PVCs (premature ventricular contractions)   . PVD (peripheral vascular disease) ([facility])    Right ABI .75, Left .78 (2006)  . Syncope 06/2012   a. Felt to be postural syncope related to diuretics 06/2012.  Marland Kitchen Systolic and diastolic CHF, chronic (HCC) cardiologsit-  dr hochrein   a. NICM - patent cors 06/2012, EF 40% at that time. b. 03/2013 eval: EF 20-25%.  . Vision loss, left eye    due to glaucoma  . Wears glasses     Past Surgical History:  Procedure Laterality Date  . AMPUTATION Left 10/16/2014   Procedure: AMPUTATION ABOVE KNEE- LEFT;   Surgeon: Mal Misty, MD;  Location: [facility];  Service: Vascular;  Laterality: Left;  . CARDIAC CATHETERIZATION  05-11-2004  DR [facility]   MINIMAL CORANARY PLAQUE/ NORMAL LVF/ EF 55%/  NON-OBSTRUCTIVE LAD 25%  . CARDIAC CATHETERIZATION  06/18/2012   dr hochrein   mild luminal irregularities of coronaries, ef 45-50%  . CARDIOVASCULAR STRESS TEST  10-15-2010   LOW RISK NUCLEAR STUDY/ NO EVIDENCE OF ISCHEMIA/ NORMAL EF  . CATARACT EXTRACTION W/ INTRAOCULAR LENS  IMPLANT, BILATERAL Bilateral   . CYSTOSCOPY  10/07/2011   Procedure: CYSTOSCOPY;  Surgeon: Malka So, MD;  Location: [facility];  Service: Urology;  Laterality: N/A;  . CYSTOSCOPY N/A 10/20/2018   Procedure: CYSTOSCOPY WITH FULGURATION OF PROSTATIC URETHRAL TUMOR;  Surgeon: [name], MD;  Location: [facility];  Service: Urology;  Laterality: N/A;  . CYSTOSCOPY WITH BIOPSY  03/14/2012   Procedure: CYSTOSCOPY WITH BIOPSY;  Surgeon: Malka So, MD;  Location: [facility];  Service: Urology;  Laterality: N/A;  WITH FULGURATION  . CYSTOSCOPY WITH BIOPSY N/A 12/09/2016   Procedure: CYSTOSCOPY WITH BIOPSY AND FULGURATION;  Surgeon: [name], MD;  Location: [facility];  Service: Urology;  Laterality: N/A;  . CYSTOSCOPY WITH BIOPSY N/A 12/20/2017   Procedure: CYSTOSCOPY WITH BIOPSY WITH FULGURATION;  Surgeon: [name], MD;  Location: [facility];  Service: Urology;  Laterality: N/A;  . CYSTOSCOPY WITH BIOPSY N/A 06/09/2018   Procedure: CYSTOSCOPY WITH BIOPSY AND FULGURATION;  Surgeon: [name], MD;  Location: [facility];  Service: Urology;  Laterality: N/A;  . CYSTOSCOPY WITH FULGERATION N/A 01/20/2016   Procedure: CYSTOSCOPY, BIOPSY WITH FULGERATION OF URTHRAL TUMOR;  Surgeon: [name], MD;  Location[facility];  Service: Urology;  Laterality: N/A;  . CYSTOSCOPY WITH FULGERATION N/A 06/01/2016   Procedure: CYSTOSCOPY WITH FULGERATION urethral tumors and bladder neck;  Surgeon: [name], MD;  Location: [facility];  Service: Urology;  Laterality:  N/A;  . SHOULDER SURGERY Left 1970's  . TONSILLECTOMY    . TRANSTHORACIC ECHOCARDIOGRAM  01-18-2017   dr hochrein   moderate LVH, ef 35-40%, diffuse hypokinesis/  trivial AR and MR/  mild TR  . TRANSURETHRAL RESECTION OF BLADDER TUMOR  10/07/2011   Procedure: TRANSURETHRAL RESECTION OF BLADDER TUMOR (TURBT);  Surgeon: Malka So, MD;  Location: [facility];  Service: Urology;  Laterality: N/A;  . TRANSURETHRAL RESECTION OF BLADDER TUMOR N/A 07/10/2015   Procedure: TRANSURETHRAL RESECTION OF BLADDER TUMOR (TURBT), CYSTOSCOPY ;  Surgeon: [name], MD;  Location[facility];  Service: Urology;  Laterality: N/A;  . TRANSURETHRAL RESECTION OF PROSTATE  03/14/2012   Procedure: TRANSURETHRAL RESECTION OF THE PROSTATE WITH GYRUS INSTRUMENTS;  Surgeon: Malka So, MD;  Location[facility];  Service: Urology;  Laterality: N/A;     reports that he quit smoking about  43 years ago. His smoking use included cigarettes. He started smoking about 76 years ago. He has a 35.00 pack-year smoking history. He has never used smokeless tobacco. He reports current alcohol use of about 7.0 standard drinks of alcohol per week. He reports that he does not use drugs.  Allergies  Allergen Reactions  . Lipitor [Atorvastatin] Other (See Comments)    Unknown rxn per pt    Family History  Problem Relation Age of Onset  . Hypertension Mother   . Arthritis Mother   . Lung cancer Sister   . Deep vein thrombosis Father   . Early death Brother     Prior to Admission medications   Medication Sig Start Date End Date Taking? Authorizing Provider  albuterol (PROVENTIL) (2.5 MG/3ML) 0.083% nebulizer solution NEBULIZE 1 VIAL EVERY 6 HOURS AS NEEDED FOR WHEEZING OR SHORTNESS OF BREATH Patient taking differently: Take 2.5 mg by nebulization every 6 (six) hours as needed for wheezing or shortness of breath.  09/07/18  Yes Dettinger, Fransisca Kaufmann, MD  donepezil (ARICEPT) 10 MG tablet TAKE ONE TABLET AT BEDTIME 11/10/18  Yes  Dettinger, Fransisca Kaufmann, MD  febuxostat (ULORIC) 40 MG tablet TAKE 1 TABLET DAILY 11/10/18  Yes Dettinger, Fransisca Kaufmann, MD  fluticasone (FLONASE) 50 MCG/ACT nasal spray Place 1 spray into both nostrils daily as needed for allergies or rhinitis. Patient taking differently: Place 1 spray into both nostrils at bedtime.  09/07/18  Yes Dettinger, Fransisca Kaufmann, MD  folic acid (FOLVITE) 1 MG tablet TAKE 1 TABLET DAILY 11/23/18  Yes Dettinger, Fransisca Kaufmann, MD  furosemide (LASIX) 40 MG tablet Take 1 tablet (40 mg total) by mouth 2 (two) times daily. 12/05/18  Yes Dettinger, Fransisca Kaufmann, MD  gabapentin (NEURONTIN) 100 MG capsule TAKE 2 CAPSULES TWICE A DAY 11/10/18  Yes Dettinger, Fransisca Kaufmann, MD  ipratropium (ATROVENT) 0.02 % nebulizer solution Take 0.5 mg by nebulization every 6 (six) hours as needed for wheezing or shortness of breath.   Yes [provider]  isosorbide dinitrate (ISORDIL) 30 MG tablet TAKE 1 TABLET ONCE A DAY Patient taking differently: Take 30 mg by mouth every morning.  03/17/18  Yes Chipper Herb, MD  metoprolol succinate (TOPROL-XL) 50 MG 24 hr tablet Take 1 tablet (50 mg total) by mouth every morning. (Appt w/ PCP for blood pressure) 08/02/18  Yes Dettinger, Fransisca Kaufmann, MD  MYRBETRIQ 25 MG TB24 tablet Take 25 mg by mouth every morning.  09/20/16  Yes [provider]  pantoprazole (PROTONIX) 40 MG tablet TAKE 1 TABLET DAILY 11/23/18  Yes Hawks, [name] A, FNP  PROAIR HFA 108 (90 Base) MCG/ACT inhaler 2 PUFFS EVERY 6 HOURS AS NEEDED FOR WHEEZING OR SHORTNESS OF BREATH Patient taking differently: Inhale 2 puffs into the lungs every 6 (six) hours as needed for wheezing or shortness of breath.  05/02/18  Yes Dettinger, Fransisca Kaufmann, MD  sertraline (ZOLOFT) 50 MG tablet TAKE 1 TABLET IN THE MORNING 11/23/18  Yes Dettinger, Fransisca Kaufmann, MD  simvastatin (ZOCOR) 10 MG tablet Take 1 tablet (10 mg total) by mouth at bedtime. 11/30/18  Yes Dettinger, Fransisca Kaufmann, MD  warfarin (COUMADIN) 2.5 MG tablet Take 1-2 tablets  (2.5-5 mg total) by mouth See admin instructions. Take 2.5 mg by mouth daily except Mondays and Fridays take 5 mg 10/11/18  Yes Dettinger, Fransisca Kaufmann, MD  COMBIVENT RESPIMAT 20-100 MCG/ACT AERS respimat 1 PUFF EVERY 4 HOURS AS NEEDED FOR COUGH, WHEEZING, OR SHORTNESS OF BREATH Patient not taking: Reported on  10/11/2018 08/23/18   Dettinger, Fransisca Kaufmann, MD  ferrous sulfate 325 (65 FE) MG tablet TAKE (1) TABLET DAILY WITH BREAKFAST. Patient not taking: Reported on 10/11/2018 08/23/18   Dettinger, Fransisca Kaufmann, MD  memantine [facility] TITRATION PAK) tablet pack 5 mg/day for =1 week; 5 mg twice daily for =1 week; 15 mg/day given in 5 mg and 10 mg separated doses for =1 week; then 10 mg twice daily Patient not taking: Reported on 12/10/2018 09/07/18   Dettinger, Fransisca Kaufmann, MD  nitroGLYCERIN (NITROSTAT) 0.4 MG SL tablet Place 1 tablet (0.4 mg total) under the tongue every 5 (five) minutes as needed for chest pain (up to 3 doses). Patient not taking: Reported on 10/11/2018 04/19/13   Charlie Pitter, PA-C  sulfamethoxazole-trimethoprim (BACTRIM DS) 800-160 MG tablet Take 1 tablet by mouth 2 (two) times daily. Patient not taking: Reported on 12/10/2018 10/31/18   Chevis Pretty, FNP    Physical Exam: Vitals:   12/10/18 1400 12/10/18 1430 12/10/18 1500 12/10/18 1530  BP: (!) 200/98 (!) 189/163 (!) 176/92 (!) 170/63  Pulse: (!) 59 62 67 (!) 56  Resp: 20 (!) 28 (!) 24 (!) 21  Temp:      TempSrc:      SpO2: 100% 100% 94% 96%  Weight:      Height:        Constitutional: Mildly agitated. Vitals:   12/10/18 1400 12/10/18 1430 12/10/18 1500 12/10/18 1530  BP: (!) 200/98 (!) 189/163 (!) 176/92 (!) 170/63  Pulse: (!) 59 62 67 (!) 56  Resp: 20 (!) 28 (!) 24 (!) 21  Temp:      TempSrc:      SpO2: 100% 100% 94% 96%  Weight:      Height:       Eyes: PERRL, lids and conjunctivae normal ENMT: Mucous membranes are moist. Posterior pharynx clear of any exudate or lesions.  Neck: normal, supple, no masses, no thyromegaly  Respiratory: Diffuse mild expiratory wheezing bilaterally, no crackles. Normal respiratory effort. No accessory muscle use.  Cardiovascular: Regular rate and rhythm, no murmurs / rubs / gallops.  Right lower extremity feels mildly puffy otherwise no extremity edema appreciated. 2+ pedal pulses.   Abdomen: no tenderness, no masses palpated. No hepatosplenomegaly. Bowel sounds positive.  Musculoskeletal: no clubbing / cyanosis.  Left above-knee amputation good ROM, no contractures. Normal muscle tone.  Skin: no rashes, lesions, ulcers. No induration Neurologic: CN 2-12 grossly intact.  Strength 5/5 in all 4.  Psychiatric: Normal judgment and insight. Alert and oriented x 3. Normal mood.   Labs on Admission: I have personally reviewed following labs and imaging studies  CBC: Recent Labs  Lab 12/10/18 1249  WBC 4.1  HGB 14.4  HCT 46.5  MCV 88.4  PLT 99991111   Basic Metabolic Panel: Recent Labs  Lab 12/10/18 1251 12/10/18 1429  NA 146*  --   K 3.2*  --   CL 108  --   CO2 29  --   GLUCOSE 95  --   BUN 25*  --   CREATININE 1.28*  --   CALCIUM 9.4  --   MG  --  2.3   Liver Function Tests: Recent Labs  Lab 12/10/18 1251  AST 21  ALT 19  ALKPHOS 178*  BILITOT 1.0  PROT 7.9  ALBUMIN 4.1   Coagulation Profile: Recent Labs  Lab 12/10/18 1249  INR 1.7*   Urine analysis:    Component Value Date/Time   COLORURINE YELLOW 12/10/2018 1257  APPEARANCEUR CLEAR 12/10/2018 1257   APPEARANCEUR Clear 11/08/2018 0000   LABSPEC 1.011 12/10/2018 1257   PHURINE 6.0 12/10/2018 [place] 12/10/2018 [place] 12/10/2018 [place] 12/10/2018 1257   BILIRUBINUR Negative 11/08/2018 0000   KETONESUR NEGATIVE 12/10/2018 1257   PROTEINUR 30 (A) 12/10/2018 1257   UROBILINOGEN negative 05/19/2015 1203   UROBILINOGEN 1.0 10/22/2014 1804   NITRITE NEGATIVE 12/10/2018 1257   LEUKOCYTESUR NEGATIVE 12/10/2018 1257    Radiological Exams on  Admission: Dg Chest Port 1 View  Result Date: 12/10/2018 CLINICAL DATA:  Shortness of breath. EXAM: PORTABLE CHEST 1 VIEW COMPARISON:  Sep 07, 2018 FINDINGS: Stable cardiomegaly. The cardiomediastinal silhouette is stable. No pneumothorax. No nodules or masses. No focal infiltrates or overt edema. IMPRESSION: No active disease. Electronically Signed   By: Dorise Bullion III M.D   On: 12/10/2018 13:31    EKG: Independently reviewed.  Sinus rhythm with PVCs.  Old prolonged PR interval 254.  Rate 65 QTc 461.  Known left bundle branch block.  Assessment/Plan Active Problems:   Dyspnea    Acute Respiratory distress-with mild hypoxia O2 sats 89% on room air.  Likely combination of mild CHF and COPD exacerbation.  COVID-19 negative.  High-sensitivity troponin 29 >> 28, flat.  No chest pain, no significant lower extremity swelling, INR subtherapeutic at 1.7, at this time low likelihood of PE. -Supplemental O2, -Consider ambulating in the a.m. if need for home O2  Combined systolic and systolic CHF exacerbation- appears mild, with BNP elevated compared to prior at 1713, from 1141 in March.  Chest x-ray read as no active disease, but on my read chest x-ray appears more congested than previous.  No significant pitting pedal edema and weights appear stable.  Home medications 40 mg Lasix twice daily.  Last echo 2018 EF 35 to 40%. -Repeat Lasix 40 mg in the a.m., further Lasix dosing pending clinical course. -Strict inputs output, daily weights  COPD exacerbation-dyspnea, wheezing on exam.  Not on home O2.  Remote smoking history. -IV Solu-Medrol 40 twice daily -DuoNeb scheduled, and as needed  Hypertension-elevated. -PRN hydralazine -Imdur, metoprolol.  Pulmonary embolism, DVT- on chronic anticoagulation with warfarin.  INR subtherapeutic at 1.7. -Resume home warfarin  Coronary artery disease-stable.  EKG without significant change.  High-sensitivity troponin unremarkable. -Resume home statins,  metoprolol, Imdur.  Bladder cancer, history of recurrent urothelial carcinoma of the prostatic follows with Dr. Jeffie Pollock.  Intermittently require cystoscopy and fulguration.  Reports worsening urinary incontinence, denies dysuria.  With rare bacteria.  Atrial fibrillation-currently in sinus rhythm, with first-degree AV block. -Resume home metoprolol and warfarin  Alzheimer's dementia-at baseline patient alert and oriented. -Resume home donepezil, sertraline.  DVT prophylaxis: Warfarin Code Status: DNR, confirmed with patient's son who is healthcare power of attorney- Scott [name] on the phone and nephew Scott [name] at bedside. Family Communication: Patient's nephew at [place], and patient's son Scott [name] on the phone. Disposition Plan: 1 to 2 days Consults called: None Admission status: Observation, telemetry   Bethena Roys MD [facility]  12/10/2018, 4:21 PM

## 2018-12-11 ENCOUNTER — Inpatient Hospital Stay (HOSPITAL_COMMUNITY): Payer: Medicare Other

## 2018-12-11 DIAGNOSIS — J441 Chronic obstructive pulmonary disease with (acute) exacerbation: Secondary | ICD-10-CM | POA: Diagnosis present

## 2018-12-11 DIAGNOSIS — F0280 Dementia in other diseases classified elsewhere without behavioral disturbance: Secondary | ICD-10-CM

## 2018-12-11 DIAGNOSIS — Z7901 Long term (current) use of anticoagulants: Secondary | ICD-10-CM | POA: Diagnosis not present

## 2018-12-11 DIAGNOSIS — I130 Hypertensive heart and chronic kidney disease with heart failure and stage 1 through stage 4 chronic kidney disease, or unspecified chronic kidney disease: Secondary | ICD-10-CM | POA: Diagnosis present

## 2018-12-11 DIAGNOSIS — N400 Enlarged prostate without lower urinary tract symptoms: Secondary | ICD-10-CM | POA: Diagnosis present

## 2018-12-11 DIAGNOSIS — C679 Malignant neoplasm of bladder, unspecified: Secondary | ICD-10-CM | POA: Diagnosis present

## 2018-12-11 DIAGNOSIS — Z89612 Acquired absence of left leg above knee: Secondary | ICD-10-CM | POA: Diagnosis not present

## 2018-12-11 DIAGNOSIS — I2510 Atherosclerotic heart disease of native coronary artery without angina pectoris: Secondary | ICD-10-CM | POA: Diagnosis present

## 2018-12-11 DIAGNOSIS — G308 Other Alzheimer's disease: Secondary | ICD-10-CM | POA: Diagnosis not present

## 2018-12-11 DIAGNOSIS — N183 Chronic kidney disease, stage 3 unspecified: Secondary | ICD-10-CM

## 2018-12-11 DIAGNOSIS — I5043 Acute on chronic combined systolic (congestive) and diastolic (congestive) heart failure: Secondary | ICD-10-CM | POA: Diagnosis not present

## 2018-12-11 DIAGNOSIS — Z87891 Personal history of nicotine dependence: Secondary | ICD-10-CM | POA: Diagnosis not present

## 2018-12-11 DIAGNOSIS — Z8261 Family history of arthritis: Secondary | ICD-10-CM | POA: Diagnosis not present

## 2018-12-11 DIAGNOSIS — Z8249 Family history of ischemic heart disease and other diseases of the circulatory system: Secondary | ICD-10-CM | POA: Diagnosis not present

## 2018-12-11 DIAGNOSIS — I5021 Acute systolic (congestive) heart failure: Secondary | ICD-10-CM | POA: Diagnosis not present

## 2018-12-11 DIAGNOSIS — Z801 Family history of malignant neoplasm of trachea, bronchus and lung: Secondary | ICD-10-CM | POA: Diagnosis not present

## 2018-12-11 DIAGNOSIS — I10 Essential (primary) hypertension: Secondary | ICD-10-CM | POA: Diagnosis not present

## 2018-12-11 DIAGNOSIS — Z66 Do not resuscitate: Secondary | ICD-10-CM | POA: Diagnosis present

## 2018-12-11 DIAGNOSIS — I248 Other forms of acute ischemic heart disease: Secondary | ICD-10-CM | POA: Diagnosis present

## 2018-12-11 DIAGNOSIS — Z86718 Personal history of other venous thrombosis and embolism: Secondary | ICD-10-CM | POA: Diagnosis not present

## 2018-12-11 DIAGNOSIS — Z86711 Personal history of pulmonary embolism: Secondary | ICD-10-CM | POA: Diagnosis not present

## 2018-12-11 DIAGNOSIS — J9601 Acute respiratory failure with hypoxia: Secondary | ICD-10-CM

## 2018-12-11 DIAGNOSIS — I5023 Acute on chronic systolic (congestive) heart failure: Secondary | ICD-10-CM

## 2018-12-11 DIAGNOSIS — I440 Atrioventricular block, first degree: Secondary | ICD-10-CM | POA: Diagnosis present

## 2018-12-11 DIAGNOSIS — R32 Unspecified urinary incontinence: Secondary | ICD-10-CM | POA: Diagnosis present

## 2018-12-11 DIAGNOSIS — G309 Alzheimer's disease, unspecified: Secondary | ICD-10-CM | POA: Diagnosis present

## 2018-12-11 DIAGNOSIS — I480 Paroxysmal atrial fibrillation: Secondary | ICD-10-CM

## 2018-12-11 DIAGNOSIS — Z20828 Contact with and (suspected) exposure to other viral communicable diseases: Secondary | ICD-10-CM | POA: Diagnosis present

## 2018-12-11 LAB — PROTIME-INR
INR: 1.8 — ABNORMAL HIGH (ref 0.8–1.2)
Prothrombin Time: 20.9 seconds — ABNORMAL HIGH (ref 11.4–15.2)

## 2018-12-11 LAB — BASIC METABOLIC PANEL
Anion gap: 10 (ref 5–15)
BUN: 28 mg/dL — ABNORMAL HIGH (ref 8–23)
CO2: 26 mmol/L (ref 22–32)
Calcium: 9.4 mg/dL (ref 8.9–10.3)
Chloride: 110 mmol/L (ref 98–111)
Creatinine, Ser: 1.34 mg/dL — ABNORMAL HIGH (ref 0.61–1.24)
GFR calc Af Amer: 52 mL/min — ABNORMAL LOW (ref >60)
GFR calc non Af Amer: 45 mL/min — ABNORMAL LOW (ref >60)
Glucose, Bld: 124 mg/dL — ABNORMAL HIGH (ref 70–99)
Potassium: 4.1 mmol/L (ref 3.5–5.1)
Sodium: 146 mmol/L — ABNORMAL HIGH (ref 135–145)

## 2018-12-11 LAB — ECHOCARDIOGRAM COMPLETE
Height: 67 in
Weight: 2499.13 oz

## 2018-12-11 MED ORDER — PREDNISONE 20 MG PO TABS
50.0000 mg | ORAL_TABLET | Freq: Every day | ORAL | Status: DC
  Administered 2018-12-12: 50 mg via ORAL
  Filled 2018-12-11: qty 2

## 2018-12-11 MED ORDER — FUROSEMIDE 10 MG/ML IJ SOLN
40.0000 mg | Freq: Two times a day (BID) | INTRAMUSCULAR | Status: DC
  Administered 2018-12-11 – 2018-12-12 (×2): 40 mg via INTRAVENOUS
  Filled 2018-12-11 (×2): qty 4

## 2018-12-11 MED ORDER — BUDESONIDE 0.5 MG/2ML IN SUSP
0.5000 mg | Freq: Two times a day (BID) | RESPIRATORY_TRACT | Status: DC
  Administered 2018-12-11 – 2018-12-12 (×2): 0.5 mg via RESPIRATORY_TRACT
  Filled 2018-12-11 (×2): qty 2

## 2018-12-11 NOTE — Progress Notes (Signed)
ANTICOAGULATION CONSULT NOTE -  Pharmacy Consult for warfarin Indication: VTE treatment  Allergies  Allergen Reactions  . Lipitor [Atorvastatin] Other (See Comments)    Unknown rxn per pt    Patient Measurements: Height: 5\' 7"  (170.2 cm) Weight: 156 lb 3.1 oz (70.8 kg) IBW/kg (Calculated) : 66.1   Vital Signs: Temp: 97.7 F (36.5 C) (08/31 0635) Temp Source: Oral (08/31 0635) BP: 168/82 (08/31 0635) Pulse Rate: 63 (08/31 0635)  Labs: Recent Labs    12/10/18 1249 12/10/18 1251 12/10/18 1429 12/11/18 0513  HGB 14.4  --   --   --   HCT 46.5  --   --   --   PLT 180  --   --   --   LABPROT 19.3*  --   --  20.9*  INR 1.7*  --   --  1.8*  CREATININE  --  1.28*  --  1.34*  TROPONINIHS 29*  --  28*  --     Estimated Creatinine Clearance: 30.8 mL/min (A) (by C-G formula based on SCr of 1.34 mg/dL (H)).   Medical History: Past Medical History:  Diagnosis Date  . Arthritis    "all over"  . Atrial fibrillation ([place])   . Benign localized prostatic hyperplasia with lower urinary tract symptoms (LUTS)   . Bradycardia 11/28/2015   Severe-HR in the 20s to 30s following intubation for urological procedure.  Marland Kitchen CAD- non obstructive disease by cath 3/14 cardiologist-  dr hochrein  . Chronic lower back pain   . COPD (chronic obstructive pulmonary disease) ([place])   . DDD (degenerative disc disease)   . Depression   . Dyspnea    w/ exertion and lying down (raise head)  . ETOH abuse   . GERD (gastroesophageal reflux disease)   . Glaucoma   . Glaucoma, both eyes   . History of bladder cancer urologist-- dr Jeffie Pollock   first dx 2012--  s/p TURBT's  . History of DVT of lower extremity 03/2013   bilateral   . History of pulmonary embolus (PE) 04/2013  . HTN (hypertension)   . Hyperlipidemia   . Hypertension   . LBBB (left bundle branch block)    chronic  . Non-ischemic cardiomyopathy ([place])    last echo 01-18-2017, ef 35-40%  . NSVT (nonsustained ventricular tachycardia) ([place])     a. NSVT 06/2012; NSVT also seen during 03/2013 adm. b. Med rx. Not candidate for ICD given adv age.  Marland Kitchen PAD (peripheral artery disease) ([place])   . 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 PVCs (premature ventricular contractions)   . PVD (peripheral vascular disease) ([facility])    Right ABI .75, Left .78 (2006)  . Syncope 06/2012   a. Felt to be postural syncope related to diuretics 06/2012.  Marland Kitchen Systolic and diastolic CHF, chronic (HCC) cardiologsit-  dr hochrein   a. NICM - patent cors 06/2012, EF 40% at that time. b. 03/2013 eval: EF 20-25%.  . Vision loss, left eye    due to glaucoma  . Wears glasses     Medications:  Medications Prior to Admission  Medication Sig Dispense Refill Last Dose  . albuterol (PROVENTIL) (2.5 MG/3ML) 0.083% nebulizer solution NEBULIZE 1 VIAL EVERY 6 HOURS AS NEEDED FOR WHEEZING OR SHORTNESS OF BREATH (Patient taking differently: Take 2.5 mg by nebulization every 6 (six) hours as needed for wheezing or shortness of breath. ) 180 mL 3  12/09/2018 at Unknown time  . donepezil (ARICEPT) 10 MG tablet TAKE ONE TABLET AT BEDTIME 30 tablet 2 12/09/2018 at Unknown time  . febuxostat (ULORIC) 40 MG tablet TAKE 1 TABLET DAILY 30 tablet 1 12/09/2018 at Unknown time  . fluticasone (FLONASE) 50 MCG/ACT nasal spray Place 1 spray into both nostrils daily as needed for allergies or rhinitis. (Patient taking differently: Place 1 spray into both nostrils at bedtime. ) 16 g 2 12/09/2018 at Unknown time  . folic acid (FOLVITE) 1 MG tablet TAKE 1 TABLET DAILY 30 tablet 0 12/09/2018 at Unknown time  . furosemide (LASIX) 40 MG tablet Take 1 tablet (40 mg total) by mouth 2 (two) times daily. 60 tablet 0 12/09/2018 at Unknown time  . gabapentin (NEURONTIN) 100 MG capsule TAKE 2 CAPSULES TWICE A DAY 120 capsule 2 12/09/2018 at Unknown time  . ipratropium (ATROVENT) 0.02 % nebulizer solution Take 0.5 mg by  nebulization every 6 (six) hours as needed for wheezing or shortness of breath.   12/09/2018 at Unknown time  . isosorbide dinitrate (ISORDIL) 30 MG tablet TAKE 1 TABLET ONCE A DAY (Patient taking differently: Take 30 mg by mouth every morning. ) 30 tablet 11 12/09/2018 at Unknown time  . metoprolol succinate (TOPROL-XL) 50 MG 24 hr tablet Take 1 tablet (50 mg total) by mouth every morning. (Appt w/ PCP for blood pressure) 90 tablet 3 12/09/2018 at 900  . MYRBETRIQ 25 MG TB24 tablet Take 25 mg by mouth every morning.    12/09/2018 at Unknown time  . pantoprazole (PROTONIX) 40 MG tablet TAKE 1 TABLET DAILY 30 tablet 0 12/09/2018 at Unknown time  . PROAIR HFA 108 (90 Base) MCG/ACT inhaler 2 PUFFS EVERY 6 HOURS AS NEEDED FOR WHEEZING OR SHORTNESS OF BREATH (Patient taking differently: Inhale 2 puffs into the lungs every 6 (six) hours as needed for wheezing or shortness of breath. ) 8.5 g 4   . sertraline (ZOLOFT) 50 MG tablet TAKE 1 TABLET IN THE MORNING 30 tablet 0 12/09/2018 at Unknown time  . simvastatin (ZOCOR) 10 MG tablet Take 1 tablet (10 mg total) by mouth at bedtime. 90 tablet 1 12/09/2018 at Unknown time  . warfarin (COUMADIN) 2.5 MG tablet Take 1-2 tablets (2.5-5 mg total) by mouth See admin instructions. Take 2.5 mg by mouth daily except Mondays and Fridays take 5 mg 40 tablet 3 12/09/2018 at 900  . COMBIVENT RESPIMAT 20-100 MCG/ACT AERS respimat 1 PUFF EVERY 4 HOURS AS NEEDED FOR COUGH, WHEEZING, OR SHORTNESS OF BREATH (Patient not taking: Reported on 10/11/2018) 4 g 0 Not Taking at Unknown time  . ferrous sulfate 325 (65 FE) MG tablet TAKE (1) TABLET DAILY WITH BREAKFAST. (Patient not taking: Reported on 10/11/2018) 30 tablet 0 Not Taking at Unknown time  . memantine (NAMENDA TITRATION PAK) tablet pack 5 mg/day for =1 week; 5 mg twice daily for =1 week; 15 mg/day given in 5 mg and 10 mg separated doses for =1 week; then 10 mg twice daily (Patient not taking: Reported on 12/10/2018) 49 tablet 12 Not Taking  at Unknown time  . nitroGLYCERIN (NITROSTAT) 0.4 MG SL tablet Place 1 tablet (0.4 mg total) under the tongue every 5 (five) minutes as needed for chest pain (up to 3 doses). (Patient not taking: Reported on 10/11/2018) 25 tablet 3 Not Taking at Unknown time  . sulfamethoxazole-trimethoprim (BACTRIM DS) 800-160 MG tablet Take 1 tablet by mouth 2 (two) times daily. (Patient not taking: Reported on 12/10/2018) 20 tablet 0 Completed  Course at Unknown time    Assessment: Pharmacy consulted to dose warfarin in  patient with history of DVT, PE, and atrial fibrillation.  Patient has listed dose of 5 mg on Monday and Friday and 2.5 mg ROW.  INR 1.8  Goal of Therapy:  INR 2-3 Monitor platelets by anticoagulation protocol: Yes   Plan:  Warfarin 5 mg x 1 dose. Monitor daily INR and s/s of bleeding.  Margot Ables, PharmD Clinical Pharmacist 12/11/2018 8:01 AM

## 2018-12-11 NOTE — Progress Notes (Signed)
*  PRELIMINARY RESULTS* Echocardiogram 2D Echocardiogram has been performed.  Scott [name] 12/11/2018, 4:44 PM

## 2018-12-11 NOTE — TOC Transition Note (Signed)
Transition of Care [facility]) - CM/SW Discharge Note   Patient Details  Name: Scott [name] MRN: [id number] Date of Birth: 1922-03-14  Transition [facility]) CM/SW Contact:  Berlinda Farve, Chauncey Reading, RN Phone Number: 12/11/2018, 3:37 PM   Clinical Narrative:   Patient is from home. He is alert and oriented, answering all questions.  Has caregivers M-F 5 hours a day with ADTS. Selinda Eon out of pocket for an extra 20 hours during the week and 5 hours on Saturday and Sunday. Family lives close by. He has a leg prosthesis, uses WC at times. Declining home health. Follow up appointment scheduled.    Final next level of care: Home/Self Care Barriers to Discharge: No Barriers Identified        Discharge Plan and Services   Discharge Planning Services: CM Consult                      Social Determinants of Health (SDOH) Interventions     Readmission Risk Interventions Readmission Risk Prevention Plan 12/11/2018 06/23/2018  Transportation Screening Complete Complete  PCP or Specialist Appt within 3-5 Days - Complete  HRI or [place] - Not Complete  HRI or Home Care Consult comments - patient declines  Social Work Consult for [place] Planning/Counseling - Complete  Palliative Care Screening - Not Applicable  Medication Review Press photographer) Complete Complete  PCP or Specialist appointment within 3-5 days of discharge Complete -  [place] or [place] Not Complete -  [place] or Home Care Consult Pt Refusal Comments not ready -  SW Recovery Care/Counseling Consult Complete -  Palliative Care Screening Not Applicable -  [place] Not Complete -  SNF Comments 24/7 care -  Some recent data might be hidden

## 2018-12-11 NOTE — Evaluation (Addendum)
Physical Therapy Evaluation Patient Details Name: Scott [name] MRN: [id number] DOB: May 21, 1921 Today's Date: 12/11/2018   History of Present Illness  Scott [name] is a 85 y.o. male with medical history significant for systolic and diastolic CHF, PE and DVT on chronic anticoagulation, atrial fibrillation, hypertension, CKD 3, COPD, bladder cancer, coronary artery disease, who was brought to the ED with 1 to 2 days of generalized weakness and worsening difficulty breathing.  Patient is awake and alert to the time of my evaluation and answers questions appropriately, caretakers- nephew and patient's son Scott [name] who is healthcare power of attorney on the phone and assist with the history.  At baseline patient has difficulty breathing with exertion, but this has worsened over the past 1 to 2 days and is present at rest.  O2 sats recorded at home were in the high 80s.  He is not on home O2.  Patient has also had intermittent lower extremity swelling, but this is not worse today compared to baseline.  They report compliance with warfarin and Lasix.  No cough.  No fevers.  Patient also reported chest tightness with difficulty breathing, but he denies any chest pain.  He denies pain with urination, but patient son reports he has had gradual worsening of his chronic urinary incontinence.  Patient son also reports intermittent mild confusion.    Clinical Impression  Patient functioning at baseline for functional mobility and is non-ambulatory.  Plan:  Patient discharged from physical therapy to care of nursing for transfers to [facility], chair daily as tolerated for length of stay.     Follow Up Recommendations No PT follow up;Supervision for mobility/OOB;Supervision/Assistance - 24 hour    Equipment Recommendations  None recommended by PT    Recommendations for Other Services       Precautions / Restrictions Precautions Precautions: Fall Restrictions Weight Bearing Restrictions: No      Mobility  Bed  Mobility Overal bed mobility: Modified Independent             General bed mobility comments: increased time  Transfers Overall transfer level: Modified independent Equipment used: None Transfers: Sit to/from [facility] Sit to Stand: Modified independent (Device/Increase time) Stand pivot transfers: Modified independent (Device/Increase time)       General transfer comment: demonstrates good return for transferring to chair  Ambulation/Gait                Stairs            Wheelchair Mobility    Modified Rankin (Stroke Patients Only)       Balance Overall balance assessment: Needs assistance Sitting-balance support: Feet supported;No upper extremity supported Sitting balance-Leahy Scale: Good     Standing balance support: Single extremity supported;During functional activity Standing balance-Leahy Scale: Poor Standing balance comment: fair/poor leaning on armrest of chair during transfer                             Pertinent Vitals/Pain Pain Assessment: No/denies pain    Home Living Family/patient expects to be discharged to:: Private residence Living Arrangements: Other (Comment);Alone(paid attendants 24/7) Available Help at Discharge: Personal care attendant;Available 24 hours/day Type of Home: House Home Access: Ramped entrance     Home Layout: One level Home Equipment: Wheelchair - power;Walker - 2 wheels;Shower seat      Prior Function Level of Independence: Needs assistance   Gait / Transfers Assistance Needed: Non-ambulatory, uses power wheelchair for mobility, Mod  Indep stand pivot transfers  ADL's / Homemaking Assistance Needed: assisted for bathing, dressing and household ADLS by paid attendants 24/7        Hand Dominance        Extremity/Trunk Assessment   Upper Extremity Assessment Upper Extremity Assessment: Overall WFL for tasks assessed    Lower Extremity Assessment Lower Extremity  Assessment: Generalized weakness    Cervical / Trunk Assessment Cervical / Trunk Assessment: Normal  Communication   Communication: No difficulties  Cognition Arousal/Alertness: Awake/alert Behavior During Therapy: WFL for tasks assessed/performed Overall Cognitive Status: Within Functional Limits for tasks assessed                                        General Comments      Exercises     Assessment/Plan    PT Assessment Patent does not need any further PT services  PT Problem List         PT Treatment Interventions      PT Goals (Current goals can be found in the Care Plan section)  Acute Rehab PT Goals Patient Stated Goal: return home with personal attendants to assist PT Goal Formulation: With patient Time For Goal Achievement: 12/11/18 Potential to Achieve Goals: Good    Frequency     Barriers to discharge        Co-evaluation               AM-PAC PT "6 Clicks" Mobility  Outcome Measure Help needed turning from your back to your side while in a flat bed without using bedrails?: None Help needed moving from lying on your back to sitting on the side of a flat bed without using bedrails?: None Help needed moving to and from a bed to a chair (including a wheelchair)?: None Help needed standing up from a chair using your arms (e.g., wheelchair or bedside chair)?: None Help needed to walk in hospital room?: Total Help needed climbing 3-5 steps with a railing? : Total 6 Click Score: 18    End of Session   Activity Tolerance: Patient tolerated treatment well;Patient limited by fatigue Patient left: in chair;with call bell/phone within reach Nurse Communication: Mobility status PT Visit Diagnosis: Unsteadiness on feet (R26.81);Other abnormalities of gait and mobility (R26.89);Muscle weakness (generalized) (M62.81)    Time: [id number] PT Time Calculation (min) (ACUTE ONLY): 25 min   Charges:   PT Evaluation $PT Eval Moderate  Complexity: 1 Mod PT Treatments $Therapeutic Activity: 23-37 mins        2:31 PM, 12/11/18 Lonell Grandchild, MPT Physical Therapist with [facility] [phone] office [phone] mobile phone

## 2018-12-11 NOTE — Progress Notes (Signed)
PROGRESS NOTE  Scott [name] [id number] DOB: September 25, 1921 DOA: 12/10/2018 PCP: Dettinger, Fransisca Kaufmann, MD  Brief History:  85 year old male with a history of systolic and diastolic CHF, PE/DVT, paroxysmal atrial fibrillation, hypertension, CKD stage III, COPD, coronary disease, bladder cancer presenting with 2 to 3-day history of generalized weakness, shortness of breath, and intermittent chest tightness.  At baseline, the patient has some dyspnea on exertion, but he stated that this has been worse than usual.  He checked his oxygen saturation at home and it was in the high 80s.  In addition, the patient has noted some increased lower extremity edema on his right leg.  He has had a history of left AKA.  He denies any fevers, chills, coughing, hemoptysis, nausea, vomiting, diarrhea, abdominal pain, dysuria.  Upon presentation, the patient was afebrile hemodynamically stable saturating 89% on room air.  His blood pressure was 208/106.  BMP showed a serum creatinine of 1.28.  CBC was unremarkable.  Urinalysis was negative pyuria.  BMP was 1713.  Chest x-ray showed poor inspiration, increased vascular congestion.  The patient was started on intravenous furosemide.   Assessment/Plan: Acute respiratory failure with hypoxia -Secondary to COPD exacerbation and pulmonary edema -Wean oxygen back to room air as tolerated for saturation greater than 92%  Acute on chronic systolic and diastolic CHF -[id number] Echo EF 35-40%, trivial AR/MR, mild decreased RV function, PASP 62 -Continue IV furosemide 40 mg IV bid -Daily weights -Echocardiogram  COPD exacerbation -Start Pulmicort  -Continue duo nebs -Wean Solu-Medrol  CKD stage 3 -baseline creatinine 1.2-1.4 -monitor with diuresis  Paroxysmal atrial fibrillation -Presently in sinus rhythm -Continue warfarin -Continue metoprolol succinate  Essential hypertension -Continue metoprolol succinate  History of DVT/PE -Continue warfarin   Dementia without behavioral disturbance -Continue Aricept   Coronary artery disease -No chest pain presently -Continue Isordil -Personally reviewed EKG--sinus rhythm, IVCD, no ST-T wave changes -Elevated troponin secondary to demand ischemia from decompensated CHF  Urothelial carcinoma -follows Dr. Jeffie [name] -s/p cystoscopy with fulguration of L-prostatic urethra tumors on 10/20/18       Disposition Plan:   Home 9/1 or 9/2 Family Communication:   Son updated on phone  Consultants:  none  Code Status: DNR  DVT Prophylaxis:  warfarin   Procedures: As Listed in Progress Note Above  Antibiotics: None      Subjective: Pt states that his breathing is better, but he continues to have dyspnea on exertion.  He denies any cp, n/v/d, f/c, headache, cough.  Objective: Vitals:   12/10/18 1943 12/10/18 2107 12/11/18 0635 12/11/18 0811  BP:  (!) 117/92 (!) 168/82   Pulse: 82 (!) 55 63   Resp: 18 20 18    Temp:  97.9 F (36.6 C) 97.7 F (36.5 C)   TempSrc:   Oral   SpO2: 96% 95% 100% 98%  Weight:   70.8 kg   Height:        Intake/Output Summary (Last 24 hours) at 12/11/2018 1021 Last data filed at 12/11/2018 0600 Gross per 24 hour  Intake 120 ml  Output 300 ml  Net -180 ml   Weight change:  Exam:   General:  Pt is alert, follows commands appropriately, not in acute distress  HEENT: No icterus, No thrush, No neck mass, [place]/AT  Cardiovascular: RRR, S1/S2, no rubs, no gallops  Respiratory: bibasilar crackles, no wheeze  Abdomen: Soft/+BS, non tender, non distended, no guarding  Extremities: 1 + RLE edema, No lymphangitis, No  petechiae, No rashes, no synovitis   Data Reviewed: I have personally reviewed following labs and imaging studies Basic Metabolic Panel: Recent Labs  Lab 12/10/18 1251 12/10/18 1429 12/11/18 0513  NA 146*  --  146*  K 3.2*  --  4.1  CL 108  --  110  CO2 29  --  26  GLUCOSE 95  --  124*  BUN 25*  --  28*  CREATININE 1.28*  --   1.34*  CALCIUM 9.4  --  9.4  MG  --  2.3  --    Liver Function Tests: Recent Labs  Lab 12/10/18 1251  AST 21  ALT 19  ALKPHOS 178*  BILITOT 1.0  PROT 7.9  ALBUMIN 4.1   No results for input(s): LIPASE, AMYLASE in the last 168 hours. No results for input(s): AMMONIA in the last 168 hours. Coagulation Profile: Recent Labs  Lab 12/10/18 1249 12/11/18 0513  INR 1.7* 1.8*   CBC: Recent Labs  Lab 12/10/18 1249  WBC 4.1  HGB 14.4  HCT 46.5  MCV 88.4  PLT 180   Cardiac Enzymes: No results for input(s): CKTOTAL, CKMB, CKMBINDEX, TROPONINI in the last 168 hours. BNP: Invalid input(s): POCBNP CBG: No results for input(s): GLUCAP in the last 168 hours. HbA1C: No results for input(s): HGBA1C in the last 72 hours. Urine analysis:    Component Value Date/Time   COLORURINE YELLOW 12/10/2018 1257   APPEARANCEUR CLEAR 12/10/2018 1257   APPEARANCEUR Clear 11/08/2018 0000   LABSPEC 1.011 12/10/2018 1257   PHURINE 6.0 12/10/2018 1257   GLUCOSEU NEGATIVE 12/10/2018 1257   HGBUR NEGATIVE 12/10/2018 1257   [facility] 12/10/2018 1257   BILIRUBINUR Negative 11/08/2018 0000   KETONESUR NEGATIVE 12/10/2018 1257   PROTEINUR 30 (A) 12/10/2018 1257   UROBILINOGEN negative 05/19/2015 1203   UROBILINOGEN 1.0 10/22/2014 1804   NITRITE NEGATIVE 12/10/2018 1257   LEUKOCYTESUR NEGATIVE 12/10/2018 1257   Sepsis Labs: @LABRCNTIP (procalcitonin:4,lacticidven:4) ) Recent Results (from the past 240 hour(s))  SARS Coronavirus 2 [facility] order, Performed in [facility] hospital lab) Nasopharyngeal Nasopharyngeal Swab     Status: None   Collection Time: 12/10/18  1:00 PM   Specimen: Nasopharyngeal Swab  Result Value Ref Range Status   SARS Coronavirus 2 NEGATIVE NEGATIVE Final    Comment: (NOTE) If result is NEGATIVE SARS-CoV-2 target nucleic acids are NOT DETECTED. The SARS-CoV-2 RNA is generally detectable in upper and lower  respiratory specimens during the acute phase of  infection. The lowest  concentration of SARS-CoV-2 viral copies this assay can detect is 250  copies / mL. A negative result does not preclude SARS-CoV-2 infection  and should not be used as the sole basis for treatment or other  patient management decisions.  A negative result may occur with  improper specimen collection / handling, submission of specimen other  than nasopharyngeal swab, presence of viral mutation(s) within the  areas targeted by this assay, and inadequate number of viral copies  (<250 copies / mL). A negative result must be combined with clinical  observations, patient history, and epidemiological information. If result is POSITIVE SARS-CoV-2 target nucleic acids are DETECTED. The SARS-CoV-2 RNA is generally detectable in upper and lower  respiratory specimens dur ing the acute phase of infection.  Positive  results are indicative of active infection with SARS-CoV-2.  Clinical  correlation with patient history and other diagnostic information is  necessary to determine patient infection status.  Positive results do  not rule out bacterial infection or  co-infection with other viruses. If result is PRESUMPTIVE POSTIVE SARS-CoV-2 nucleic acids MAY BE PRESENT.   A presumptive positive result was obtained on the submitted specimen  and confirmed on repeat testing.  While 2019 novel coronavirus  (SARS-CoV-2) nucleic acids may be present in the submitted sample  additional confirmatory testing may be necessary for epidemiological  and / or clinical management purposes  to differentiate between  SARS-CoV-2 and other Sarbecovirus currently known to infect humans.  If clinically indicated additional testing with an alternate test  methodology [phone]) is advised. The SARS-CoV-2 RNA is generally  detectable in upper and lower respiratory sp ecimens during the acute  phase of infection. The expected result is Negative. Fact Sheet for Patients:   StrictlyIdeas.no Fact Sheet for Healthcare Providers: BankingDealers.co.za This test is not yet approved or cleared by the [place] FDA and has been authorized for detection and/or diagnosis of SARS-CoV-2 by FDA under an Emergency Use Authorization (EUA).  This EUA will remain in effect (meaning this test can be used) for the duration of the COVID-19 declaration under Section 564(b)(1) of the Act, 21 U.S.C. section 360bbb-3(b)(1), unless the authorization is terminated or revoked sooner. Performed at [facility], [address], [place], [place] [address]      Scheduled Meds: . donepezil  10 mg Oral QHS  . febuxostat  40 mg Oral Daily  . fluticasone  1 spray Each Nare QHS  . gabapentin  200 mg Oral BID  . ipratropium-albuterol  3 mL Nebulization Q6H  . isosorbide dinitrate  30 mg Oral Daily  . methylPREDNISolone (SOLU-MEDROL) injection  40 mg Intravenous Q12H  . metoprolol succinate  50 mg Oral q morning - 10a  . mirabegron ER  25 mg Oral q morning - 10a  . pantoprazole  40 mg Oral Daily  . sertraline  50 mg Oral q morning - 10a  . simvastatin  10 mg Oral QHS  . Warfarin - Pharmacist Dosing Inpatient   Does not apply q1800   Continuous Infusions:  Procedures/Studies: Dg Chest Port 1 View  Result Date: 12/10/2018 CLINICAL DATA:  Shortness of breath. EXAM: PORTABLE CHEST 1 VIEW COMPARISON:  Sep 07, 2018 FINDINGS: Stable cardiomegaly. The cardiomediastinal silhouette is stable. No pneumothorax. No nodules or masses. No focal infiltrates or overt edema. IMPRESSION: No active disease. Electronically Signed   By: Dorise Bullion III ScottD   On: 12/10/2018 13:31    Orson Eva, DO  [facility] Pager [phone]  If 7PM-7AM, please contact night-coverage [email or web address] Password [email or web address] 12/11/2018, 10:21 AM   LOS: 0 days

## 2018-12-11 NOTE — Discharge Summary (Signed)
Physician Discharge Summary  Scott [name] [id number] DOB: [birth year] DOA: 12/10/2018  PCP: Dettinger, Fransisca Kaufmann, MD  Admit date: 12/10/2018 Discharge date: 12/12/2018  Admitted From:  Home Disposition:  Home   Recommendations for Outpatient Follow-up:  1. Follow up with PCP in 1-2 weeks 2. Please obtain BMP/CBC in one week 3. Please follow up on the following pending results:   Discharge Condition: Stable CODE STATUS: DNR Diet recommendation: Heart Healthy    Brief/Interim Summary: 85 year old male with a history of systolic and diastolic CHF, PE/DVT, paroxysmal atrial fibrillation, hypertension, CKD stage III, COPD, coronary disease, bladder cancer presenting with 2 to 3-day history of generalized weakness, shortness of breath, and intermittent chest tightness.  At baseline, the patient has some dyspnea on exertion, but he stated that this has been worse than usual.  He checked his oxygen saturation at home and it was in the high 80s.  In addition, the patient has noted some increased lower extremity edema on his right leg.  He has had a history of left AKA.  He denies any fevers, chills, coughing, hemoptysis, nausea, vomiting, diarrhea, abdominal pain, dysuria.  Upon presentation, the patient was afebrile hemodynamically stable saturating 89% on room air.  His blood pressure was 208/106.  BMP showed a serum creatinine of 1.28.  CBC was unremarkable.  Urinalysis was negative pyuria.  BMP was 1713.  Chest x-ray showed poor inspiration, increased vascular congestion.  The patient was started on intravenous furosemide with clinical improvement   Discharge Diagnoses:  Acute respiratory failure with hypoxia -Secondary to COPD exacerbation and pulmonary edema -Wean oxygen back to room air as tolerated for saturation greater than 92% -stable on RA at time of d/c -ambulation with PT did not show oxygen desaturation <88%  Acute on chronic systolic and diastolic CHF -99991111 Echo EF  35-40%, trivial AR/MR, mild decreased RV function, PASP 62 -Continue IV furosemide 40 mg IV bid>>>po 40 mg bid -Daily weights--discharge weight 151.9 lbs (NEG 4.2 lbs) -8/30--Echocardiogram--EF 35-50%, AK anteroseptal, basal inferior; PASP 70  COPD exacerbation -Started Pulmicort  -Continued duo nebs -Wean Solu-Medrol>>>d/c home with prednisone taper  CKD stage 3 -baseline creatinine 1.2-1.4 -monitor with diuresis -serum creatinine 1.48 on day of d/c  Paroxysmal atrial fibrillation -Presently in sinus rhythm -Continue warfarin -Continue metoprolol succinate -INR 2.3 on day of d/c  Essential hypertension -Continue metoprolol succinate -increase isordil to 30 mg bid  History of DVT/PE -Continue warfarin  Dementia without behavioral disturbance -Continue Aricept   Coronary artery disease -No chest pain presently -Continue Isordil-->increase to 30 mg bid -Personally reviewed EKG--sinus rhythm, IVCD, no ST-T wave changes -Elevated troponin secondary to demand ischemia from decompensated CHF -start ASA 81  Urothelial carcinoma -follows Dr. Jeffie Pollock -s/p cystoscopy with fulguration of L-prostatic urethra tumors on 10/20/18    Discharge Instructions   Allergies as of 12/12/2018      Reactions   Lipitor [atorvastatin] Other (See Comments)   Unknown rxn per pt      Medication List    STOP taking these medications   ferrous sulfate 325 (65 FE) MG tablet   memantine tablet pack Commonly known as: Namenda Titration Pak   nitroGLYCERIN 0.4 MG SL tablet Commonly known as: NITROSTAT   sulfamethoxazole-trimethoprim 800-160 MG tablet Commonly known as: Bactrim DS     TAKE these medications   aspirin 81 MG EC tablet Take 1 tablet (81 mg total) by mouth daily.   Combivent Respimat 20-100 MCG/ACT Aers respimat Generic drug: Ipratropium-Albuterol 1 PUFF EVERY 4  HOURS AS NEEDED FOR COUGH, WHEEZING, OR SHORTNESS OF BREATH   donepezil 10 MG tablet Commonly known  as: ARICEPT TAKE ONE TABLET AT BEDTIME   febuxostat 40 MG tablet Commonly known as: ULORIC TAKE 1 TABLET DAILY   fluticasone 50 MCG/ACT nasal spray Commonly known as: FLONASE Place 1 spray into both nostrils daily as needed for allergies or rhinitis. What changed: when to take this   folic acid 1 MG tablet Commonly known as: FOLVITE TAKE 1 TABLET DAILY   furosemide 40 MG tablet Commonly known as: LASIX Take 1 tablet (40 mg total) by mouth 2 (two) times daily.   gabapentin 100 MG capsule Commonly known as: NEURONTIN TAKE 2 CAPSULES TWICE A DAY   ipratropium 0.02 % nebulizer solution Commonly known as: ATROVENT Take 0.5 mg by nebulization every 6 (six) hours as needed for wheezing or shortness of breath.   isosorbide dinitrate 30 MG tablet Commonly known as: ISORDIL Take 1 tablet (30 mg total) by mouth 2 (two) times daily. What changed: when to take this   metoprolol succinate 50 MG 24 hr tablet Commonly known as: TOPROL-XL Take 1 tablet (50 mg total) by mouth every morning. (Appt w/ PCP for blood pressure)   Myrbetriq 25 MG Tb24 tablet Generic drug: mirabegron ER Take 25 mg by mouth every morning.   pantoprazole 40 MG tablet Commonly known as: PROTONIX TAKE 1 TABLET DAILY   predniSONE 10 MG tablet Commonly known as: DELTASONE Take 6 tablets (60 mg total) by mouth daily with breakfast. And decrease by one tablet daily Start taking on: December 13, 2018   ProAir HFA 108 (90 Base) MCG/ACT inhaler Generic drug: albuterol 2 PUFFS EVERY 6 HOURS AS NEEDED FOR WHEEZING OR SHORTNESS OF BREATH What changed: See the new instructions.   albuterol (2.5 MG/3ML) 0.083% nebulizer solution Commonly known as: PROVENTIL NEBULIZE 1 VIAL EVERY 6 HOURS AS NEEDED FOR WHEEZING OR SHORTNESS OF BREATH What changed:   how much to take  how to take this  when to take this  reasons to take this  additional instructions   sertraline 50 MG tablet Commonly known as: ZOLOFT TAKE  1 TABLET IN THE MORNING   simvastatin 10 MG tablet Commonly known as: ZOCOR Take 1 tablet (10 mg total) by mouth at bedtime.   warfarin 2.5 MG tablet Commonly known as: COUMADIN Take as directed. If you are unsure how to take this medication, talk to your nurse or doctor. Original instructions: Take 1-2 tablets (2.5-5 mg total) by mouth See admin instructions. Take 2.5 mg by mouth daily except Mondays and Fridays take 5 mg      Follow-up Information    [facility] Follow up on 12/15/2018.   Why: Please follow up with [place] on Friday, September 4th at 11:25am.  This will be a telephone appointment.  Contact information: [place] [phone]         Allergies  Allergen Reactions  . Lipitor [Atorvastatin] Other (See Comments)    Unknown rxn per pt    Consultations:  none   Procedures/Studies: Dg Chest Port 1 View  Result Date: 12/10/2018 CLINICAL DATA:  Shortness of breath. EXAM: PORTABLE CHEST 1 VIEW COMPARISON:  Sep 07, 2018 FINDINGS: Stable cardiomegaly. The cardiomediastinal silhouette is stable. No pneumothorax. No nodules or masses. No focal infiltrates or overt edema. IMPRESSION: No active disease. Electronically Signed   By: Dorise Bullion III M.D   On: 12/10/2018  13:31        Discharge Exam: Vitals:   12/12/18 0812 12/12/18 0900  BP:  (!) 152/76  Pulse:  65  Resp:    Temp:    SpO2: 100%    Vitals:   12/12/18 0805 12/12/18 0809 12/12/18 0812 12/12/18 0900  BP:  (!) 174/89  (!) 152/76  Pulse:  69  65  Resp:  19    Temp:      TempSrc:      SpO2: 93% 100% 100%   Weight:      Height:        General: Pt is alert, awake, not in acute distress Cardiovascular: RRR, S1/S2 +, no rubs, no gallops Respiratory: CTA bilaterally, no wheezing, no rhonchi Abdominal: Soft, NT, ND, bowel sounds + Extremities: no edema, no cyanosis   The results of significant  diagnostics from this hospitalization (including imaging, microbiology, ancillary and laboratory) are listed below for reference.    Significant Diagnostic Studies: Dg Chest Port 1 View  Result Date: 12/10/2018 CLINICAL DATA:  Shortness of breath. EXAM: PORTABLE CHEST 1 VIEW COMPARISON:  Sep 07, 2018 FINDINGS: Stable cardiomegaly. The cardiomediastinal silhouette is stable. No pneumothorax. No nodules or masses. No focal infiltrates or overt edema. IMPRESSION: No active disease. Electronically Signed   By: Dorise Bullion III M.D   On: 12/10/2018 13:31     Microbiology: Recent Results (from the past 240 hour(s))  SARS Coronavirus 2 [facility] order, Performed in [facility] hospital lab) Nasopharyngeal Nasopharyngeal Swab     Status: None   Collection Time: 12/10/18  1:00 PM   Specimen: Nasopharyngeal Swab  Result Value Ref Range Status   SARS Coronavirus 2 NEGATIVE NEGATIVE Final    Comment: (NOTE) If result is NEGATIVE SARS-CoV-2 target nucleic acids are NOT DETECTED. The SARS-CoV-2 RNA is generally detectable in upper and lower  respiratory specimens during the acute phase of infection. The lowest  concentration of SARS-CoV-2 viral copies this assay can detect is 250  copies / mL. A negative result does not preclude SARS-CoV-2 infection  and should not be used as the sole basis for treatment or other  patient management decisions.  A negative result may occur with  improper specimen collection / handling, submission of specimen other  than nasopharyngeal swab, presence of viral mutation(s) within the  areas targeted by this assay, and inadequate number of viral copies  (<250 copies / mL). A negative result must be combined with clinical  observations, patient history, and epidemiological information. If result is POSITIVE SARS-CoV-2 target nucleic acids are DETECTED. The SARS-CoV-2 RNA is generally detectable in upper and lower  respiratory specimens dur ing the acute phase of  infection.  Positive  results are indicative of active infection with SARS-CoV-2.  Clinical  correlation with patient history and other diagnostic information is  necessary to determine patient infection status.  Positive results do  not rule out bacterial infection or co-infection with other viruses. If result is PRESUMPTIVE POSTIVE SARS-CoV-2 nucleic acids MAY BE PRESENT.   A presumptive positive result was obtained on the submitted specimen  and confirmed on repeat testing.  While 2019 novel coronavirus  (SARS-CoV-2) nucleic acids may be present in the submitted sample  additional confirmatory testing may be necessary for epidemiological  and / or clinical management purposes  to differentiate between  SARS-CoV-2 and other Sarbecovirus currently known to infect humans.  If clinically indicated additional testing with an alternate test  methodology [phone]) is advised. The SARS-CoV-2 RNA is  generally  detectable in upper and lower respiratory sp ecimens during the acute  phase of infection. The expected result is Negative. Fact Sheet for Patients:  StrictlyIdeas.no Fact Sheet for Healthcare Providers: BankingDealers.co.za This test is not yet approved or cleared by the [place] FDA and has been authorized for detection and/or diagnosis of SARS-CoV-2 by FDA under an Emergency Use Authorization (EUA).  This EUA will remain in effect (meaning this test can be used) for the duration of the COVID-19 declaration under Section 564(b)(1) of the Act, 21 U.S.C. section 360bbb-3(b)(1), unless the authorization is terminated or revoked sooner. Performed at [facility], [address] [place], [place] [address]      Labs: Basic Metabolic Panel: Recent Labs  Lab 12/10/18 1251 12/10/18 1429 12/11/18 0513 12/12/18 0512  NA 146*  --  146* 142  K 3.2*  --  4.1 4.0  CL 108  --  110 104  CO2 29  --  26 29  GLUCOSE 95  --  124* 117*  BUN  25*  --  28* 40*  CREATININE 1.28*  --  1.34* 1.48*  CALCIUM 9.4  --  9.4 9.5  MG  --  2.3  --  2.3   Liver Function Tests: Recent Labs  Lab 12/10/18 1251  AST 21  ALT 19  ALKPHOS 178*  BILITOT 1.0  PROT 7.9  ALBUMIN 4.1   No results for input(s): LIPASE, AMYLASE in the last 168 hours. No results for input(s): AMMONIA in the last 168 hours. CBC: Recent Labs  Lab 12/10/18 1249  WBC 4.1  HGB 14.4  HCT 46.5  MCV 88.4  PLT 180   Cardiac Enzymes: No results for input(s): CKTOTAL, CKMB, CKMBINDEX, TROPONINI in the last 168 hours. BNP: Invalid input(s): POCBNP CBG: No results for input(s): GLUCAP in the last 168 hours.  Time coordinating discharge:  36 minutes  Signed:  Orson Eva, DO [facility] Pager: [phone] 12/12/2018, 12:07 PM

## 2018-12-12 DIAGNOSIS — Z89612 Acquired absence of left leg above knee: Secondary | ICD-10-CM

## 2018-12-12 LAB — BASIC METABOLIC PANEL
Anion gap: 9 (ref 5–15)
BUN: 40 mg/dL — ABNORMAL HIGH (ref 8–23)
CO2: 29 mmol/L (ref 22–32)
Calcium: 9.5 mg/dL (ref 8.9–10.3)
Chloride: 104 mmol/L (ref 98–111)
Creatinine, Ser: 1.48 mg/dL — ABNORMAL HIGH (ref 0.61–1.24)
GFR calc Af Amer: 46 mL/min — ABNORMAL LOW (ref >60)
GFR calc non Af Amer: 40 mL/min — ABNORMAL LOW (ref >60)
Glucose, Bld: 117 mg/dL — ABNORMAL HIGH (ref 70–99)
Potassium: 4 mmol/L (ref 3.5–5.1)
Sodium: 142 mmol/L (ref 135–145)

## 2018-12-12 LAB — PROTIME-INR
INR: 2.3 — ABNORMAL HIGH (ref 0.8–1.2)
Prothrombin Time: 25.2 seconds — ABNORMAL HIGH (ref 11.4–15.2)

## 2018-12-12 LAB — MAGNESIUM: Magnesium: 2.3 mg/dL (ref 1.7–2.4)

## 2018-12-12 MED ORDER — WARFARIN SODIUM 2.5 MG PO TABS: 2.5000 mg | ORAL_TABLET | Freq: Once | ORAL | Status: DC

## 2018-12-12 MED ORDER — ASPIRIN 81 MG PO TBEC: 81.0000 mg | DELAYED_RELEASE_TABLET | Freq: Every day | ORAL | Status: DC

## 2018-12-12 MED ORDER — ISOSORBIDE DINITRATE 20 MG PO TABS: 30.0000 mg | ORAL_TABLET | Freq: Two times a day (BID) | ORAL | Status: DC

## 2018-12-12 MED ORDER — ISOSORBIDE DINITRATE 30 MG PO TABS: 30.0000 mg | ORAL_TABLET | Freq: Two times a day (BID) | ORAL | 1 refills | Status: DC

## 2018-12-12 MED ORDER — PREDNISONE 10 MG PO TABS: 60.0000 mg | ORAL_TABLET | Freq: Every day | ORAL | 0 refills | Status: DC

## 2018-12-12 MED ORDER — ASPIRIN EC 81 MG PO TBEC
81.0000 mg | DELAYED_RELEASE_TABLET | Freq: Every day | ORAL | Status: DC
  Administered 2018-12-12: 81 mg via ORAL
  Filled 2018-12-12: qty 1

## 2018-12-12 NOTE — Progress Notes (Signed)
ANTICOAGULATION CONSULT NOTE -  Pharmacy Consult for warfarin Indication: VTE treatment  Allergies  Allergen Reactions  . Lipitor [Atorvastatin] Other (See Comments)    Unknown rxn per pt    Patient Measurements: Height: 5\' 7"  (170.2 cm) Weight: 151 lb 14.4 oz (68.9 kg) IBW/kg (Calculated) : 66.1   Vital Signs: Temp: 97.4 F (36.3 C) (09/01 0545) Temp Source: Oral (09/01 0545) BP: 152/76 (09/01 0900) Pulse Rate: 65 (09/01 0900)  Labs: Recent Labs    12/10/18 1249 12/10/18 1251 12/10/18 1429 12/11/18 0513 12/12/18 0512  HGB 14.4  --   --   --   --   HCT 46.5  --   --   --   --   PLT 180  --   --   --   --   LABPROT 19.3*  --   --  20.9* 25.2*  INR 1.7*  --   --  1.8* 2.3*  CREATININE  --  1.28*  --  1.34* 1.48*  TROPONINIHS 29*  --  28*  --   --     Estimated Creatinine Clearance: 27.9 mL/min (A) (by C-G formula based on SCr of 1.48 mg/dL (H)).   Medical History: Past Medical History:  Diagnosis Date  . Arthritis    "all over"  . Atrial fibrillation ([place])   . Benign localized prostatic hyperplasia with lower urinary tract symptoms (LUTS)   . Bradycardia 11/28/2015   Severe-HR in the 20s to 30s following intubation for urological procedure.  Marland Kitchen CAD- non obstructive disease by cath 3/14 cardiologist-  dr hochrein  . Chronic lower back pain   . COPD (chronic obstructive pulmonary disease) ([place])   . DDD (degenerative disc disease)   . Depression   . Dyspnea    w/ exertion and lying down (raise head)  . ETOH abuse   . GERD (gastroesophageal reflux disease)   . Glaucoma   . Glaucoma, both eyes   . History of bladder cancer urologist-- dr Jeffie Pollock   first dx 2012--  s/p TURBT's  . History of DVT of lower extremity 03/2013   bilateral   . History of pulmonary embolus (PE) 04/2013  . HTN (hypertension)   . Hyperlipidemia   . Hypertension   . LBBB (left bundle branch block)    chronic  . Non-ischemic cardiomyopathy ([place])    last echo 01-18-2017, ef 35-40%   . NSVT (nonsustained ventricular tachycardia) ([place])    a. NSVT 06/2012; NSVT also seen during 03/2013 adm. b. Med rx. Not candidate for ICD given adv age.  Marland Kitchen PAD (peripheral artery disease) ([place])   . 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 PVCs (premature ventricular contractions)   . PVD (peripheral vascular disease) ([facility])    Right ABI .75, Left .78 (2006)  . Syncope 06/2012   a. Felt to be postural syncope related to diuretics 06/2012.  Marland Kitchen Systolic and diastolic CHF, chronic (HCC) cardiologsit-  dr hochrein   a. NICM - patent cors 06/2012, EF 40% at that time. b. 03/2013 eval: EF 20-25%.  . Vision loss, left eye    due to glaucoma  . Wears glasses     Medications:  Medications Prior to Admission  Medication Sig Dispense Refill Last Dose  . albuterol (PROVENTIL) (2.5 MG/3ML) 0.083% nebulizer solution NEBULIZE 1 VIAL EVERY 6 HOURS AS NEEDED FOR WHEEZING OR SHORTNESS OF BREATH (Patient taking differently: Take 2.5 mg by  nebulization every 6 (six) hours as needed for wheezing or shortness of breath. ) 180 mL 3 12/09/2018 at Unknown time  . donepezil (ARICEPT) 10 MG tablet TAKE ONE TABLET AT BEDTIME 30 tablet 2 12/09/2018 at Unknown time  . febuxostat (ULORIC) 40 MG tablet TAKE 1 TABLET DAILY 30 tablet 1 12/09/2018 at Unknown time  . fluticasone (FLONASE) 50 MCG/ACT nasal spray Place 1 spray into both nostrils daily as needed for allergies or rhinitis. (Patient taking differently: Place 1 spray into both nostrils at bedtime. ) 16 g 2 12/09/2018 at Unknown time  . folic acid (FOLVITE) 1 MG tablet TAKE 1 TABLET DAILY 30 tablet 0 12/09/2018 at Unknown time  . furosemide (LASIX) 40 MG tablet Take 1 tablet (40 mg total) by mouth 2 (two) times daily. 60 tablet 0 12/09/2018 at Unknown time  . gabapentin (NEURONTIN) 100 MG capsule TAKE 2 CAPSULES TWICE A DAY 120 capsule 2 12/09/2018 at Unknown time  . ipratropium  (ATROVENT) 0.02 % nebulizer solution Take 0.5 mg by nebulization every 6 (six) hours as needed for wheezing or shortness of breath.   12/09/2018 at Unknown time  . isosorbide dinitrate (ISORDIL) 30 MG tablet TAKE 1 TABLET ONCE A DAY (Patient taking differently: Take 30 mg by mouth every morning. ) 30 tablet 11 12/09/2018 at Unknown time  . metoprolol succinate (TOPROL-XL) 50 MG 24 hr tablet Take 1 tablet (50 mg total) by mouth every morning. (Appt w/ PCP for blood pressure) 90 tablet 3 12/09/2018 at 900  . MYRBETRIQ 25 MG TB24 tablet Take 25 mg by mouth every morning.    12/09/2018 at Unknown time  . pantoprazole (PROTONIX) 40 MG tablet TAKE 1 TABLET DAILY 30 tablet 0 12/09/2018 at Unknown time  . PROAIR HFA 108 (90 Base) MCG/ACT inhaler 2 PUFFS EVERY 6 HOURS AS NEEDED FOR WHEEZING OR SHORTNESS OF BREATH (Patient taking differently: Inhale 2 puffs into the lungs every 6 (six) hours as needed for wheezing or shortness of breath. ) 8.5 g 4   . sertraline (ZOLOFT) 50 MG tablet TAKE 1 TABLET IN THE MORNING 30 tablet 0 12/09/2018 at Unknown time  . simvastatin (ZOCOR) 10 MG tablet Take 1 tablet (10 mg total) by mouth at bedtime. 90 tablet 1 12/09/2018 at Unknown time  . warfarin (COUMADIN) 2.5 MG tablet Take 1-2 tablets (2.5-5 mg total) by mouth See admin instructions. Take 2.5 mg by mouth daily except Mondays and Fridays take 5 mg 40 tablet 3 12/09/2018 at 900  . COMBIVENT RESPIMAT 20-100 MCG/ACT AERS respimat 1 PUFF EVERY 4 HOURS AS NEEDED FOR COUGH, WHEEZING, OR SHORTNESS OF BREATH (Patient not taking: Reported on 10/11/2018) 4 g 0 Not Taking at Unknown time  . ferrous sulfate 325 (65 FE) MG tablet TAKE (1) TABLET DAILY WITH BREAKFAST. (Patient not taking: Reported on 10/11/2018) 30 tablet 0 Not Taking at Unknown time  . memantine (NAMENDA TITRATION PAK) tablet pack 5 mg/day for =1 week; 5 mg twice daily for =1 week; 15 mg/day given in 5 mg and 10 mg separated doses for =1 week; then 10 mg twice daily (Patient not  taking: Reported on 12/10/2018) 49 tablet 12 Not Taking at Unknown time  . nitroGLYCERIN (NITROSTAT) 0.4 MG SL tablet Place 1 tablet (0.4 mg total) under the tongue every 5 (five) minutes as needed for chest pain (up to 3 doses). (Patient not taking: Reported on 10/11/2018) 25 tablet 3 Not Taking at Unknown time  . sulfamethoxazole-trimethoprim (BACTRIM DS) 800-160 MG tablet Take 1  tablet by mouth 2 (two) times daily. (Patient not taking: Reported on 12/10/2018) 20 tablet 0 Completed Course at Unknown time    Assessment: Pharmacy consulted to dose warfarin in  patient with history of DVT, PE, and atrial fibrillation.  Patient has listed dose of 5 mg on Monday and Friday and 2.5 mg ROW.  INR 1.8>> 2.3, therapeutic  Goal of Therapy:  INR 2-3 Monitor platelets by anticoagulation protocol: Yes   Plan:  Warfarin 2.5 mg x 1 dose. Monitor daily INR and s/s of bleeding.  Isac Sarna, BS Pharm D, [place] Clinical Pharmacist Pager [phone] 12/12/2018 10:43 AM

## 2018-12-12 NOTE — Progress Notes (Signed)
Pt IV removed, WNL. D/C instructions given to pt. Verbalized understanding. Pt caregiver at bedside to transport home.

## 2018-12-15 ENCOUNTER — Ambulatory Visit (INDEPENDENT_AMBULATORY_CARE_PROVIDER_SITE_OTHER): Payer: Medicare Other | Admitting: Family Medicine

## 2018-12-15 ENCOUNTER — Encounter: Payer: Self-pay | Admitting: Family Medicine

## 2018-12-15 DIAGNOSIS — I5043 Acute on chronic combined systolic (congestive) and diastolic (congestive) heart failure: Secondary | ICD-10-CM

## 2018-12-15 NOTE — Progress Notes (Signed)
Virtual Visit via telephone Note  I connected with Scott [name] on 12/15/18 at 1140 by telephone and verified that I am speaking with the correct person using two identifiers. Scott [name] is currently located at home and caregiver stephen are currently with her during visit. The provider, Fransisca Kaufmann Mercia Dowe, MD is located in their office at time of visit.  Call ended at 1154  I discussed the limitations, risks, security and privacy concerns of performing an evaluation and management service by telephone and the availability of in person appointments. I also discussed with the patient that there may be a patient responsible charge related to this service. The patient expressed understanding and agreed to proceed.   History and Present Illness: Patient was moaning and groaning and was taken to the hospital and found to have chest tightness and had a breathing treatment and wasn't feeling better and was taken to the ED and was admitted on 12/10/2018 and discharged on 12/12/2018.  He was SOB and was found to have elevated BP and copd and chf exacerbation.  His o2 was down in the low 80's.  The did treat him with diuresis and said to limit fluid intake.  He has not been having any issues since leaving hospital.  [facility] for CHF flare.  He is taking asa 81 and increased imdur to 2 times per day. 130/80 and 140/60  No diagnosis found.  Outpatient Encounter Medications as of 12/15/2018  Medication Sig  . albuterol (PROVENTIL) (2.5 MG/3ML) 0.083% nebulizer solution NEBULIZE 1 VIAL EVERY 6 HOURS AS NEEDED FOR WHEEZING OR SHORTNESS OF BREATH (Patient taking differently: Take 2.5 mg by nebulization every 6 (six) hours as needed for wheezing or shortness of breath. )  . aspirin EC 81 MG EC tablet Take 1 tablet (81 mg total) by mouth daily.  . COMBIVENT RESPIMAT 20-100 MCG/ACT AERS respimat 1 PUFF EVERY 4 HOURS AS NEEDED FOR COUGH, WHEEZING, OR SHORTNESS OF BREATH (Patient not taking: Reported  on 10/11/2018)  . donepezil (ARICEPT) 10 MG tablet TAKE ONE TABLET AT BEDTIME  . febuxostat (ULORIC) 40 MG tablet TAKE 1 TABLET DAILY  . fluticasone (FLONASE) 50 MCG/ACT nasal spray Place 1 spray into both nostrils daily as needed for allergies or rhinitis. (Patient taking differently: Place 1 spray into both nostrils at bedtime. )  . folic acid (FOLVITE) 1 MG tablet TAKE 1 TABLET DAILY  . furosemide (LASIX) 40 MG tablet Take 1 tablet (40 mg total) by mouth 2 (two) times daily.  Marland Kitchen gabapentin (NEURONTIN) 100 MG capsule TAKE 2 CAPSULES TWICE A DAY  . ipratropium (ATROVENT) 0.02 % nebulizer solution Take 0.5 mg by nebulization every 6 (six) hours as needed for wheezing or shortness of breath.  . isosorbide dinitrate (ISORDIL) 30 MG tablet Take 1 tablet (30 mg total) by mouth 2 (two) times daily.  . metoprolol succinate (TOPROL-XL) 50 MG 24 hr tablet Take 1 tablet (50 mg total) by mouth every morning. (Appt w/ PCP for blood pressure)  . MYRBETRIQ 25 MG TB24 tablet Take 25 mg by mouth every morning.   . pantoprazole (PROTONIX) 40 MG tablet TAKE 1 TABLET DAILY  . predniSONE (DELTASONE) 10 MG tablet Take 6 tablets (60 mg total) by mouth daily with breakfast. And decrease by one tablet daily  . PROAIR HFA 108 (90 Base) MCG/ACT inhaler 2 PUFFS EVERY 6 HOURS AS NEEDED FOR WHEEZING OR SHORTNESS OF BREATH (Patient taking differently: Inhale 2 puffs into the lungs every  6 (six) hours as needed for wheezing or shortness of breath. )  . sertraline (ZOLOFT) 50 MG tablet TAKE 1 TABLET IN THE MORNING  . simvastatin (ZOCOR) 10 MG tablet Take 1 tablet (10 mg total) by mouth at bedtime.  Marland Kitchen warfarin (COUMADIN) 2.5 MG tablet Take 1-2 tablets (2.5-5 mg total) by mouth See admin instructions. Take 2.5 mg by mouth daily except Mondays and Fridays take 5 mg   No facility-administered encounter medications on file as of 12/15/2018.     Review of Systems  Constitutional: Negative for chills and fever.  Eyes: Negative for  visual disturbance.  Respiratory: Positive for shortness of breath. Negative for wheezing.   Cardiovascular: Negative for chest pain, palpitations and leg swelling.  Musculoskeletal: Negative for back pain and gait problem.  Skin: Negative for rash.  Neurological: Negative for dizziness, weakness and light-headedness.  All other systems reviewed and are negative.   Observations/Objective: Patient sounds comfortable and in no acute distress  Assessment and Plan: Problem List Items Addressed This Visit    None    Visit Diagnoses    Acute on chronic combined systolic and diastolic congestive heart failure ([place])    -  Primary   Relevant Orders   CBC with Differential/Platelet   CMP14+EGFR       Follow Up Instructions:  Patient was in the hospital for acute CHF exacerbation and fluid overload and was treated with diuresis and has been doing a lot better since leaving, spoke with his caretaker and says that he has been much improved and they adjusted some of his medications but his blood pressures are running fine.  He will follow-up for his next INR in the next 3 to 4 weeks.   I discussed the assessment and treatment plan with the patient. The patient was provided an opportunity to ask questions and all were answered. The patient agreed with the plan and demonstrated an understanding of the instructions.   The patient was advised to call back or seek an in-person evaluation if the symptoms worsen or if the condition fails to improve as anticipated.  The above assessment and management plan was discussed with the patient. The patient verbalized understanding of and has agreed to the management plan. Patient is aware to call the clinic if symptoms persist or worsen. Patient is aware when to return to the clinic for a follow-up visit. Patient educated on when it is appropriate to go to the emergency department.    I provided 14 minutes of non-face-to-face time during this encounter.     Worthy Rancher, MD

## 2018-12-20 ENCOUNTER — Other Ambulatory Visit: Payer: Medicare Other

## 2018-12-20 ENCOUNTER — Other Ambulatory Visit: Payer: Self-pay

## 2018-12-20 DIAGNOSIS — I5043 Acute on chronic combined systolic (congestive) and diastolic (congestive) heart failure: Secondary | ICD-10-CM | POA: Diagnosis not present

## 2018-12-21 ENCOUNTER — Emergency Department (HOSPITAL_COMMUNITY): Payer: Medicare Other

## 2018-12-21 ENCOUNTER — Encounter (HOSPITAL_COMMUNITY): Payer: Self-pay | Admitting: Emergency Medicine

## 2018-12-21 ENCOUNTER — Other Ambulatory Visit: Payer: Self-pay | Admitting: Family Medicine

## 2018-12-21 ENCOUNTER — Other Ambulatory Visit: Payer: Self-pay

## 2018-12-21 ENCOUNTER — Inpatient Hospital Stay (HOSPITAL_COMMUNITY)
Admission: EM | Admit: 2018-12-21 | Discharge: 2018-12-26 | DRG: 871 | Disposition: A | Discharge status: Home Health | Payer: Medicare Other | Attending: Internal Medicine | Admitting: Internal Medicine

## 2018-12-21 DIAGNOSIS — I998 Other disorder of circulatory system: Secondary | ICD-10-CM

## 2018-12-21 DIAGNOSIS — N400 Enlarged prostate without lower urinary tract symptoms: Secondary | ICD-10-CM | POA: Diagnosis present

## 2018-12-21 DIAGNOSIS — Z66 Do not resuscitate: Secondary | ICD-10-CM | POA: Diagnosis present

## 2018-12-21 DIAGNOSIS — E7800 Pure hypercholesterolemia, unspecified: Secondary | ICD-10-CM | POA: Diagnosis not present

## 2018-12-21 DIAGNOSIS — Z7901 Long term (current) use of anticoagulants: Secondary | ICD-10-CM

## 2018-12-21 DIAGNOSIS — Z86711 Personal history of pulmonary embolism: Secondary | ICD-10-CM

## 2018-12-21 DIAGNOSIS — S199XXA Unspecified injury of neck, initial encounter: Secondary | ICD-10-CM | POA: Diagnosis not present

## 2018-12-21 DIAGNOSIS — M79673 Pain in unspecified foot: Secondary | ICD-10-CM | POA: Diagnosis present

## 2018-12-21 DIAGNOSIS — Z89612 Acquired absence of left leg above knee: Secondary | ICD-10-CM

## 2018-12-21 DIAGNOSIS — I5023 Acute on chronic systolic (congestive) heart failure: Secondary | ICD-10-CM | POA: Diagnosis not present

## 2018-12-21 DIAGNOSIS — Z961 Presence of intraocular lens: Secondary | ICD-10-CM | POA: Diagnosis present

## 2018-12-21 DIAGNOSIS — R001 Bradycardia, unspecified: Secondary | ICD-10-CM | POA: Diagnosis not present

## 2018-12-21 DIAGNOSIS — Y92239 Unspecified place in hospital as the place of occurrence of the external cause: Secondary | ICD-10-CM | POA: Diagnosis not present

## 2018-12-21 DIAGNOSIS — C675 Malignant neoplasm of bladder neck: Secondary | ICD-10-CM | POA: Diagnosis not present

## 2018-12-21 DIAGNOSIS — I10 Essential (primary) hypertension: Secondary | ICD-10-CM | POA: Diagnosis not present

## 2018-12-21 DIAGNOSIS — I5022 Chronic systolic (congestive) heart failure: Secondary | ICD-10-CM | POA: Diagnosis present

## 2018-12-21 DIAGNOSIS — G308 Other Alzheimer's disease: Secondary | ICD-10-CM

## 2018-12-21 DIAGNOSIS — N183 Chronic kidney disease, stage 3 (moderate): Secondary | ICD-10-CM | POA: Diagnosis present

## 2018-12-21 DIAGNOSIS — E782 Mixed hyperlipidemia: Secondary | ICD-10-CM

## 2018-12-21 DIAGNOSIS — M79671 Pain in right foot: Secondary | ICD-10-CM | POA: Diagnosis not present

## 2018-12-21 DIAGNOSIS — R509 Fever, unspecified: Secondary | ICD-10-CM | POA: Diagnosis not present

## 2018-12-21 DIAGNOSIS — F329 Major depressive disorder, single episode, unspecified: Secondary | ICD-10-CM | POA: Diagnosis present

## 2018-12-21 DIAGNOSIS — N179 Acute kidney failure, unspecified: Secondary | ICD-10-CM | POA: Diagnosis present

## 2018-12-21 DIAGNOSIS — I739 Peripheral vascular disease, unspecified: Secondary | ICD-10-CM | POA: Diagnosis present

## 2018-12-21 DIAGNOSIS — I480 Paroxysmal atrial fibrillation: Secondary | ICD-10-CM | POA: Diagnosis not present

## 2018-12-21 DIAGNOSIS — N189 Chronic kidney disease, unspecified: Secondary | ICD-10-CM | POA: Diagnosis not present

## 2018-12-21 DIAGNOSIS — H5462 Unqualified visual loss, left eye, normal vision right eye: Secondary | ICD-10-CM | POA: Diagnosis present

## 2018-12-21 DIAGNOSIS — Z9841 Cataract extraction status, right eye: Secondary | ICD-10-CM

## 2018-12-21 DIAGNOSIS — Z86718 Personal history of other venous thrombosis and embolism: Secondary | ICD-10-CM | POA: Diagnosis not present

## 2018-12-21 DIAGNOSIS — I447 Left bundle-branch block, unspecified: Secondary | ICD-10-CM | POA: Diagnosis present

## 2018-12-21 DIAGNOSIS — G8929 Other chronic pain: Secondary | ICD-10-CM | POA: Diagnosis present

## 2018-12-21 DIAGNOSIS — S299XXA Unspecified injury of thorax, initial encounter: Secondary | ICD-10-CM | POA: Diagnosis not present

## 2018-12-21 DIAGNOSIS — I5042 Chronic combined systolic (congestive) and diastolic (congestive) heart failure: Secondary | ICD-10-CM | POA: Diagnosis present

## 2018-12-21 DIAGNOSIS — I771 Stricture of artery: Secondary | ICD-10-CM | POA: Diagnosis present

## 2018-12-21 DIAGNOSIS — Z20828 Contact with and (suspected) exposure to other viral communicable diseases: Secondary | ICD-10-CM | POA: Diagnosis not present

## 2018-12-21 DIAGNOSIS — Z87891 Personal history of nicotine dependence: Secondary | ICD-10-CM

## 2018-12-21 DIAGNOSIS — Z79899 Other long term (current) drug therapy: Secondary | ICD-10-CM

## 2018-12-21 DIAGNOSIS — I214 Non-ST elevation (NSTEMI) myocardial infarction: Secondary | ICD-10-CM | POA: Diagnosis not present

## 2018-12-21 DIAGNOSIS — L0390 Cellulitis, unspecified: Secondary | ICD-10-CM

## 2018-12-21 DIAGNOSIS — E785 Hyperlipidemia, unspecified: Secondary | ICD-10-CM | POA: Diagnosis present

## 2018-12-21 DIAGNOSIS — I2510 Atherosclerotic heart disease of native coronary artery without angina pectoris: Secondary | ICD-10-CM | POA: Diagnosis not present

## 2018-12-21 DIAGNOSIS — R7989 Other specified abnormal findings of blood chemistry: Secondary | ICD-10-CM

## 2018-12-21 DIAGNOSIS — F0280 Dementia in other diseases classified elsewhere without behavioral disturbance: Secondary | ICD-10-CM | POA: Diagnosis present

## 2018-12-21 DIAGNOSIS — L03115 Cellulitis of right lower limb: Secondary | ICD-10-CM | POA: Diagnosis present

## 2018-12-21 DIAGNOSIS — K219 Gastro-esophageal reflux disease without esophagitis: Secondary | ICD-10-CM | POA: Diagnosis present

## 2018-12-21 DIAGNOSIS — C679 Malignant neoplasm of bladder, unspecified: Secondary | ICD-10-CM | POA: Diagnosis present

## 2018-12-21 DIAGNOSIS — Z888 Allergy status to other drugs, medicaments and biological substances status: Secondary | ICD-10-CM

## 2018-12-21 DIAGNOSIS — N1831 Chronic kidney disease, stage 3a: Secondary | ICD-10-CM | POA: Diagnosis present

## 2018-12-21 DIAGNOSIS — I130 Hypertensive heart and chronic kidney disease with heart failure and stage 1 through stage 4 chronic kidney disease, or unspecified chronic kidney disease: Secondary | ICD-10-CM | POA: Diagnosis present

## 2018-12-21 DIAGNOSIS — H409 Unspecified glaucoma: Secondary | ICD-10-CM | POA: Diagnosis present

## 2018-12-21 DIAGNOSIS — A419 Sepsis, unspecified organism: Principal | ICD-10-CM | POA: Diagnosis present

## 2018-12-21 DIAGNOSIS — M1990 Unspecified osteoarthritis, unspecified site: Secondary | ICD-10-CM | POA: Diagnosis present

## 2018-12-21 DIAGNOSIS — Z8249 Family history of ischemic heart disease and other diseases of the circulatory system: Secondary | ICD-10-CM

## 2018-12-21 DIAGNOSIS — I700 Atherosclerosis of aorta: Secondary | ICD-10-CM | POA: Diagnosis present

## 2018-12-21 DIAGNOSIS — Z7982 Long term (current) use of aspirin: Secondary | ICD-10-CM

## 2018-12-21 DIAGNOSIS — Z9842 Cataract extraction status, left eye: Secondary | ICD-10-CM

## 2018-12-21 DIAGNOSIS — M25571 Pain in right ankle and joints of right foot: Secondary | ICD-10-CM | POA: Diagnosis not present

## 2018-12-21 DIAGNOSIS — W19XXXA Unspecified fall, initial encounter: Secondary | ICD-10-CM

## 2018-12-21 DIAGNOSIS — I472 Ventricular tachycardia: Secondary | ICD-10-CM | POA: Diagnosis not present

## 2018-12-21 DIAGNOSIS — R062 Wheezing: Secondary | ICD-10-CM

## 2018-12-21 DIAGNOSIS — Y92009 Unspecified place in unspecified non-institutional (private) residence as the place of occurrence of the external cause: Secondary | ICD-10-CM | POA: Diagnosis not present

## 2018-12-21 DIAGNOSIS — Z801 Family history of malignant neoplasm of trachea, bronchus and lung: Secondary | ICD-10-CM

## 2018-12-21 DIAGNOSIS — I428 Other cardiomyopathies: Secondary | ICD-10-CM | POA: Diagnosis present

## 2018-12-21 DIAGNOSIS — I252 Old myocardial infarction: Secondary | ICD-10-CM | POA: Diagnosis present

## 2018-12-21 DIAGNOSIS — I440 Atrioventricular block, first degree: Secondary | ICD-10-CM | POA: Diagnosis present

## 2018-12-21 DIAGNOSIS — M79604 Pain in right leg: Secondary | ICD-10-CM

## 2018-12-21 DIAGNOSIS — S99911A Unspecified injury of right ankle, initial encounter: Secondary | ICD-10-CM | POA: Diagnosis not present

## 2018-12-21 DIAGNOSIS — T380X5A Adverse effect of glucocorticoids and synthetic analogues, initial encounter: Secondary | ICD-10-CM | POA: Diagnosis not present

## 2018-12-21 DIAGNOSIS — M545 Low back pain: Secondary | ICD-10-CM | POA: Diagnosis present

## 2018-12-21 DIAGNOSIS — G309 Alzheimer's disease, unspecified: Secondary | ICD-10-CM | POA: Diagnosis present

## 2018-12-21 DIAGNOSIS — J449 Chronic obstructive pulmonary disease, unspecified: Secondary | ICD-10-CM | POA: Diagnosis present

## 2018-12-21 DIAGNOSIS — Z8551 Personal history of malignant neoplasm of bladder: Secondary | ICD-10-CM

## 2018-12-21 DIAGNOSIS — M109 Gout, unspecified: Secondary | ICD-10-CM | POA: Diagnosis present

## 2018-12-21 DIAGNOSIS — S0990XA Unspecified injury of head, initial encounter: Secondary | ICD-10-CM | POA: Diagnosis not present

## 2018-12-21 DIAGNOSIS — Z8261 Family history of arthritis: Secondary | ICD-10-CM

## 2018-12-21 LAB — CMP14+EGFR
ALT: 21 IU/L (ref 0–44)
AST: 15 IU/L (ref 0–40)
Albumin/Globulin Ratio: 2.1 (ref 1.2–2.2)
Albumin: 3.9 g/dL (ref 3.5–4.6)
Alkaline Phosphatase: 153 IU/L — ABNORMAL HIGH (ref 39–117)
BUN/Creatinine Ratio: 22 (ref 10–24)
BUN: 32 mg/dL (ref 10–36)
Bilirubin Total: 0.3 mg/dL (ref 0.0–1.2)
CO2: 29 mmol/L (ref 20–29)
Calcium: 9.1 mg/dL (ref 8.6–10.2)
Chloride: 102 mmol/L (ref 96–106)
Creatinine, Ser: 1.43 mg/dL — ABNORMAL HIGH (ref 0.76–1.27)
GFR calc Af Amer: 48 mL/min/{1.73_m2} — ABNORMAL LOW (ref >59)
GFR calc non Af Amer: 41 mL/min/{1.73_m2} — ABNORMAL LOW (ref >59)
Globulin, Total: 1.9 g/dL (ref 1.5–4.5)
Glucose: 75 mg/dL (ref 65–99)
Potassium: 3.8 mmol/L (ref 3.5–5.2)
Sodium: 146 mmol/L — ABNORMAL HIGH (ref 134–144)
Total Protein: 5.8 g/dL — ABNORMAL LOW (ref 6.0–8.5)

## 2018-12-21 LAB — CBC WITH DIFFERENTIAL/PLATELET
Abs Immature Granulocytes: 0.09 10*3/uL — ABNORMAL HIGH (ref 0.00–0.07)
Basophils Absolute: 0 10*3/uL (ref 0.0–0.2)
Basophils Absolute: 0 10*3/uL (ref 0.0–0.1)
Basophils Relative: 0 %
Basos: 0 %
EOS (ABSOLUTE): 0.1 10*3/uL (ref 0.0–0.4)
Eos: 2 %
Eosinophils Absolute: 0 10*3/uL (ref 0.0–0.5)
Eosinophils Relative: 0 %
HCT: 46.1 % (ref 39.0–52.0)
Hematocrit: 42.3 % (ref 37.5–51.0)
Hemoglobin: 13.9 g/dL (ref 13.0–17.7)
Hemoglobin: 14.5 g/dL (ref 13.0–17.0)
Immature Grans (Abs): 0 10*3/uL (ref 0.0–0.1)
Immature Granulocytes: 0 %
Immature Granulocytes: 1 %
Lymphocytes Absolute: 1.7 10*3/uL (ref 0.7–3.1)
Lymphocytes Relative: 7 %
Lymphs Abs: 1 10*3/uL (ref 0.7–4.0)
Lymphs: 21 %
MCH: 27.9 pg (ref 26.6–33.0)
MCH: 27.6 pg (ref 26.0–34.0)
MCHC: 32.9 g/dL (ref 31.5–35.7)
MCHC: 31.5 g/dL (ref 30.0–36.0)
MCV: 85 fL (ref 79–97)
MCV: 87.6 fL (ref 80.0–100.0)
Monocytes Absolute: 0.7 10*3/uL (ref 0.1–0.9)
Monocytes Absolute: 0.8 10*3/uL (ref 0.1–1.0)
Monocytes Relative: 6 %
Monocytes: 8 %
Neutro Abs: 13.1 10*3/uL — ABNORMAL HIGH (ref 1.7–7.7)
Neutrophils Absolute: 5.4 10*3/uL (ref 1.4–7.0)
Neutrophils Relative %: 86 %
Neutrophils: 69 %
Platelets: 194 10*3/uL (ref 150–450)
Platelets: 163 10*3/uL (ref 150–400)
RBC: 4.99 x10E6/uL (ref 4.14–5.80)
RBC: 5.26 MIL/uL (ref 4.22–5.81)
RDW: 14.6 % (ref 11.6–15.4)
RDW: 16 % — ABNORMAL HIGH (ref 11.5–15.5)
WBC: 7.9 10*3/uL (ref 3.4–10.8)
WBC: 15 10*3/uL — ABNORMAL HIGH (ref 4.0–10.5)
nRBC: 0 % (ref 0.0–0.2)

## 2018-12-21 LAB — COMPREHENSIVE METABOLIC PANEL
ALT: 20 U/L (ref 0–44)
AST: 18 U/L (ref 15–41)
Albumin: 3.6 g/dL (ref 3.5–5.0)
Alkaline Phosphatase: 122 U/L (ref 38–126)
Anion gap: 10 (ref 5–15)
BUN: 35 mg/dL — ABNORMAL HIGH (ref 8–23)
CO2: 31 mmol/L (ref 22–32)
Calcium: 8.9 mg/dL (ref 8.9–10.3)
Chloride: 100 mmol/L (ref 98–111)
Creatinine, Ser: 1.69 mg/dL — ABNORMAL HIGH (ref 0.61–1.24)
GFR calc Af Amer: 39 mL/min — ABNORMAL LOW (ref >60)
GFR calc non Af Amer: 34 mL/min — ABNORMAL LOW (ref >60)
Glucose, Bld: 89 mg/dL (ref 70–99)
Potassium: 3.7 mmol/L (ref 3.5–5.1)
Sodium: 141 mmol/L (ref 135–145)
Total Bilirubin: 1.2 mg/dL (ref 0.3–1.2)
Total Protein: 6.5 g/dL (ref 6.5–8.1)

## 2018-12-21 LAB — LACTIC ACID, PLASMA
Lactic Acid, Venous: 1.7 mmol/L (ref 0.5–1.9)
Lactic Acid, Venous: 2.2 mmol/L (ref 0.5–1.9)

## 2018-12-21 LAB — LIPASE, BLOOD: Lipase: 40 U/L (ref 11–51)

## 2018-12-21 LAB — TROPONIN I (HIGH SENSITIVITY)
Troponin I (High Sensitivity): 1324 ng/L (ref <18)
Troponin I (High Sensitivity): 1350 ng/L (ref <18)

## 2018-12-21 LAB — PROTIME-INR
INR: 2.1 — ABNORMAL HIGH (ref 0.8–1.2)
Prothrombin Time: 22.9 seconds — ABNORMAL HIGH (ref 11.4–15.2)

## 2018-12-21 LAB — SARS CORONAVIRUS 2 BY RT PCR (HOSPITAL ORDER, PERFORMED IN ~~LOC~~ HOSPITAL LAB): SARS Coronavirus 2: NEGATIVE

## 2018-12-21 MED ORDER — IPRATROPIUM-ALBUTEROL 20-100 MCG/ACT IN AERS: 1.0000 | INHALATION_SPRAY | Freq: Four times a day (QID) | RESPIRATORY_TRACT | Status: DC | PRN

## 2018-12-21 MED ORDER — SIMVASTATIN 20 MG PO TABS
10.0000 mg | ORAL_TABLET | Freq: Every day | ORAL | Status: DC
  Administered 2018-12-21 – 2018-12-25 (×5): 10 mg via ORAL
  Filled 2018-12-21 (×5): qty 1

## 2018-12-21 MED ORDER — ACETAMINOPHEN 325 MG PO TABS
650.0000 mg | ORAL_TABLET | Freq: Four times a day (QID) | ORAL | Status: DC | PRN
  Administered 2018-12-21 – 2018-12-24 (×2): 650 mg via ORAL
  Filled 2018-12-21 (×2): qty 2

## 2018-12-21 MED ORDER — ALBUTEROL SULFATE HFA 108 (90 BASE) MCG/ACT IN AERS: 2.0000 | INHALATION_SPRAY | Freq: Four times a day (QID) | RESPIRATORY_TRACT | Status: DC | PRN

## 2018-12-21 MED ORDER — SERTRALINE HCL 50 MG PO TABS
50.0000 mg | ORAL_TABLET | Freq: Every morning | ORAL | Status: DC
  Administered 2018-12-22 – 2018-12-26 (×5): 50 mg via ORAL
  Filled 2018-12-21 (×5): qty 1

## 2018-12-21 MED ORDER — SODIUM CHLORIDE 0.9 % IV SOLN
2.0000 g | Freq: Once | INTRAVENOUS | Status: AC
  Administered 2018-12-21: 2 g via INTRAVENOUS
  Filled 2018-12-21: qty 20

## 2018-12-21 MED ORDER — IPRATROPIUM BROMIDE 0.02 % IN SOLN: 0.5000 mg | Freq: Four times a day (QID) | RESPIRATORY_TRACT | Status: DC | PRN

## 2018-12-21 MED ORDER — INFLUENZA VAC A&B SA ADJ QUAD 0.5 ML IM PRSY
0.5000 mL | PREFILLED_SYRINGE | INTRAMUSCULAR | Status: DC
  Filled 2018-12-21: qty 0.5

## 2018-12-21 MED ORDER — DONEPEZIL HCL 10 MG PO TABS
10.0000 mg | ORAL_TABLET | Freq: Every day | ORAL | Status: DC
  Administered 2018-12-21 – 2018-12-25 (×5): 10 mg via ORAL
  Filled 2018-12-21 (×5): qty 1

## 2018-12-21 MED ORDER — ASPIRIN EC 81 MG PO TBEC
81.0000 mg | DELAYED_RELEASE_TABLET | Freq: Every day | ORAL | Status: DC
  Administered 2018-12-22 – 2018-12-26 (×5): 81 mg via ORAL
  Filled 2018-12-21 (×6): qty 1

## 2018-12-21 MED ORDER — ALBUTEROL SULFATE (2.5 MG/3ML) 0.083% IN NEBU: 2.5000 mg | INHALATION_SOLUTION | Freq: Four times a day (QID) | RESPIRATORY_TRACT | Status: DC | PRN

## 2018-12-21 MED ORDER — IPRATROPIUM-ALBUTEROL 0.5-2.5 (3) MG/3ML IN SOLN
3.0000 mL | Freq: Four times a day (QID) | RESPIRATORY_TRACT | Status: DC | PRN
  Administered 2018-12-24: 3 mL via RESPIRATORY_TRACT
  Filled 2018-12-21: qty 3

## 2018-12-21 MED ORDER — SODIUM CHLORIDE 0.9 % IV SOLN
INTRAVENOUS | Status: AC
  Administered 2018-12-21: 16:00:00 via INTRAVENOUS

## 2018-12-21 MED ORDER — FLUTICASONE PROPIONATE 50 MCG/ACT NA SUSP
1.0000 | Freq: Every day | NASAL | Status: DC
  Administered 2018-12-22 – 2018-12-25 (×4): 1 via NASAL
  Filled 2018-12-21: qty 16

## 2018-12-21 MED ORDER — VANCOMYCIN HCL IN DEXTROSE 750-5 MG/150ML-% IV SOLN
750.0000 mg | Freq: Once | INTRAVENOUS | Status: AC
  Administered 2018-12-21: 750 mg via INTRAVENOUS
  Filled 2018-12-21: qty 150

## 2018-12-21 MED ORDER — ISOSORBIDE DINITRATE 10 MG PO TABS
30.0000 mg | ORAL_TABLET | Freq: Two times a day (BID) | ORAL | Status: DC
  Administered 2018-12-21 – 2018-12-24 (×7): 30 mg via ORAL
  Filled 2018-12-21 (×8): qty 3

## 2018-12-21 MED ORDER — ONDANSETRON HCL 4 MG PO TABS: 4.0000 mg | ORAL_TABLET | Freq: Four times a day (QID) | ORAL | Status: DC | PRN

## 2018-12-21 MED ORDER — ACETAMINOPHEN 650 MG RE SUPP: 650.0000 mg | Freq: Four times a day (QID) | RECTAL | Status: DC | PRN

## 2018-12-21 MED ORDER — ONDANSETRON HCL 4 MG/2ML IJ SOLN: 4.0000 mg | Freq: Four times a day (QID) | INTRAMUSCULAR | Status: DC | PRN

## 2018-12-21 MED ORDER — VANCOMYCIN HCL IN DEXTROSE 1-5 GM/200ML-% IV SOLN
1000.0000 mg | Freq: Once | INTRAVENOUS | Status: DC
  Filled 2018-12-21: qty 200

## 2018-12-21 MED ORDER — SODIUM CHLORIDE 0.9 % IV SOLN
1.0000 g | INTRAVENOUS | Status: DC
  Administered 2018-12-22 – 2018-12-25 (×4): 1 g via INTRAVENOUS
  Filled 2018-12-21 (×4): qty 10

## 2018-12-21 MED ORDER — VANCOMYCIN HCL 500 MG IV SOLR
500.0000 mg | INTRAVENOUS | Status: DC
  Filled 2018-12-21: qty 500

## 2018-12-21 MED ORDER — MIRABEGRON ER 25 MG PO TB24
25.0000 mg | ORAL_TABLET | Freq: Every morning | ORAL | Status: DC
  Administered 2018-12-22 – 2018-12-26 (×5): 25 mg via ORAL
  Filled 2018-12-21 (×5): qty 1

## 2018-12-21 NOTE — ED Provider Notes (Signed)
Emergency Department Provider Note   I have reviewed the triage vital signs and the nursing notes.   HISTORY  Chief Complaint Fall   HPI Scott [name] is a 85 y.o. male with PMH of A-fib on Coumadin, PAD, COPD, HTN, HLD, CHF, and s/p AKA on the left presents to the emergency department for evaluation after fall last night.  Patient's caretaker is at bedside and the patient's son is on the phone.  According to family the patient fell last night.  This was not witnessed and the patient was found on the ground stuck behind his bed.  They helped him back into bed but the caregiver has noticed some confusion today which is worse than normal and the patient's been complaining of right lower leg pain.  Caregiver states that he has been unable to bear weight on the right leg due to discomfort.  Patient is denying any chest pain or shortness of breath but level 5 caveat does apply with some confusion. No fever noted at home but is febrile here in the ED.    Past Medical History:  Diagnosis Date   Arthritis    "all over"   Atrial fibrillation ([place])    Benign localized prostatic hyperplasia with lower urinary tract symptoms (LUTS)    Bradycardia 11/28/2015   Severe-HR in the 20s to 30s following intubation for urological procedure.   CAD- non obstructive disease by cath 3/14 cardiologist-  dr hochrein   Chronic lower back pain    COPD (chronic obstructive pulmonary disease) ([place])    DDD (degenerative disc disease)    Depression    Dyspnea    w/ exertion and lying down (raise head)   ETOH abuse    GERD (gastroesophageal reflux disease)    Glaucoma    Glaucoma, both eyes    History of bladder cancer urologist-- dr Jeffie Pollock   first dx 2012--  s/p TURBT's   History of DVT of lower extremity 03/2013   bilateral    History of pulmonary embolus (PE) 04/2013   HTN (hypertension)    Hyperlipidemia    Hypertension    LBBB (left bundle branch block)    chronic    Non-ischemic cardiomyopathy ([place])    last echo 01-18-2017, ef 35-40%   NSVT (nonsustained ventricular tachycardia) ([place])    a. NSVT 06/2012; NSVT also seen during 03/2013 adm. b. Med rx. Not candidate for ICD given adv age.   PAD (peripheral artery disease) (HCC)    Popliteal artery aneurysm, bilateral (HCC)    DOCUMENTED CHRONIC PARTIAL OCCLUSION--  PT DENIES CLAUDICATION OR ANY OTHER SYMPTOMS   PVCs (premature ventricular contractions)    PVD (peripheral vascular disease) (HCC)    Right ABI .75, Left .78 (2006)   Syncope 06/2012   a. Felt to be postural syncope related to diuretics 06/2012.   Systolic and diastolic CHF, chronic (HCC) cardiologsit-  dr hochrein   a. NICM - patent cors 06/2012, EF 40% at that time. b. 03/2013 eval: EF 20-25%.   Vision loss, left eye    due to glaucoma   Wears glasses     Patient Active Problem List   Diagnosis Date Noted   Foot pain 12/21/2018   Cellulitis 12/21/2018   CKD (chronic kidney disease) stage 3, GFR 30-59 ml/min (HCC) 12/11/2018   Acute on chronic combined systolic and diastolic CHF (congestive heart failure) ([place]) 12/11/2018   Acute respiratory failure with hypoxia ([place])    Dyspnea 12/10/2018   Alzheimer disease ([place])  09/07/2018   SIRS (systemic inflammatory response syndrome) (HCC) 06/22/2018   Paroxysmal atrial fibrillation ([place]) 01/18/2017   PVC (pulmonary venous congestion) 01/18/2017   Thoracic aortic atherosclerosis ([place]) 11/26/2016   Thrombocytopenia ([place]) 08/04/2016   Chronic kidney disease (CKD) stage G3a/A1, moderately decreased glomerular filtration rate (GFR) between 45-59 mL/min/1.73 square meter and albuminuria creatinine ratio less than 30 mg/g (HCC) 08/04/2016   Essential hypertension 11/29/2015   Bradycardia 11/28/2015   Malignant neoplasm of lateral wall of urinary bladder ([place]) 07/10/2015   Status post above-knee amputation of left lower extremity ([place]) 10/18/2014   Tachyarrhythmia  10/05/2014   Aspiration pneumonia ([place]) 10/05/2014   Lower urinary tract infectious disease 10/03/2014   Sepsis due to other etiology ([place]) 10/03/2014   Gangrene of toe ([place]) 10/03/2014   Ischemic ulcer of left foot ([place]) 10/03/2014   Atherosclerotic PVD with ulceration ([place]) 10/03/2014   Atrial fibrillation with RVR ([place]) 10/03/2014   Hyperkalemia 10/03/2014   PAD (peripheral artery disease) ([place]) 09/24/2014   NSTEMI (non-ST elevated myocardial infarction) ([place]) 10/09/2013   High risk medication use 05/17/2013   BPH (benign prostatic hyperplasia) 05/17/2013   Vitamin D deficiency 05/17/2013   History of pulmonary embolism (2014) 04/17/2013   NSVT (nonsustained ventricular tachycardia)- not an ICD candidate 04/17/2013   NICM (nonischemic cardiomyopathy)- EF 20-25% by Echo 04/12/13 04/17/2013   Chronic combined systolic and diastolic heart failure ([place]) 03/15/2013   DVT of lower extremity, bilateral ([place]) 03/15/2013   Fatigue 11/02/2012   Constipation 11/02/2012   Syncope 06/21/2012   Bladder cancer ([place]) 03/15/2012   Benign localized hyperplasia of prostate with urinary obstruction 03/15/2012   Hyperlipidemia    PVCs (premature ventricular contractions)    CAD- non obstructive disease by cath 3/14     Past Surgical History:  Procedure Laterality Date   AMPUTATION Left 10/16/2014   Procedure: AMPUTATION ABOVE KNEE- LEFT;  Surgeon: Mal Misty, MD;  Location: [facility];  Service: Vascular;  Laterality: Left;   CARDIAC CATHETERIZATION  05-11-2004  DR Sweeny PLAQUE/ NORMAL LVF/ EF 55%/  NON-OBSTRUCTIVE LAD 25%   CARDIAC CATHETERIZATION  06/18/2012   dr hochrein   mild luminal irregularities of coronaries, ef 45-50%   CARDIOVASCULAR STRESS TEST  10-15-2010   LOW RISK NUCLEAR STUDY/ NO EVIDENCE OF ISCHEMIA/ NORMAL EF   CATARACT EXTRACTION W/ INTRAOCULAR LENS  IMPLANT, BILATERAL Bilateral    CYSTOSCOPY  10/07/2011   Procedure:  CYSTOSCOPY;  Surgeon: Malka So, MD;  Location: [facility];  Service: Urology;  Laterality: N/A;   CYSTOSCOPY N/A 10/20/2018   Procedure: CYSTOSCOPY WITH FULGURATION OF PROSTATIC URETHRAL TUMOR;  Surgeon: [name], MD;  Location[facility];  Service: Urology;  Laterality: N/A;   CYSTOSCOPY WITH BIOPSY  03/14/2012   Procedure: CYSTOSCOPY WITH BIOPSY;  Surgeon: Malka So, MD;  Location: [facility];  Service: Urology;  Laterality: N/A;  WITH FULGURATION   CYSTOSCOPY WITH BIOPSY N/A 12/09/2016   Procedure: CYSTOSCOPY WITH BIOPSY AND FULGURATION;  Surgeon: [name], MD;  Location: [facility];  Service: Urology;  Laterality: N/A;   CYSTOSCOPY WITH BIOPSY N/A 12/20/2017   Procedure: CYSTOSCOPY WITH BIOPSY WITH FULGURATION;  Surgeon: [name], MD;  Location[facility];  Service: Urology;  Laterality: N/A;   CYSTOSCOPY WITH BIOPSY N/A 06/09/2018   Procedure: CYSTOSCOPY WITH BIOPSY AND FULGURATION;  Surgeon: [name], MD;  Location: [facility];  Service: Urology;  Laterality: N/A;   CYSTOSCOPY WITH FULGERATION N/A 01/20/2016   Procedure: CYSTOSCOPY,  BIOPSY WITH FULGERATION OF Theodis Shove TUMOR;  Surgeon: [name], MD;  [facility];  Service: Urology;  Laterality: N/A;   CYSTOSCOPY WITH FULGERATION N/A 06/01/2016   Procedure: CYSTOSCOPY WITH FULGERATION urethral tumors and bladder neck;  Surgeon: [name], MD;  [facility];  Service: Urology;  Laterality: N/A;   SHOULDER SURGERY Left 1970's   TONSILLECTOMY     TRANSTHORACIC ECHOCARDIOGRAM  01-18-2017   dr hochrein   moderate LVH, ef 35-40%, diffuse hypokinesis/  trivial AR and MR/  mild TR   TRANSURETHRAL RESECTION OF BLADDER TUMOR  10/07/2011   Procedure: TRANSURETHRAL RESECTION OF BLADDER TUMOR (TURBT);  Surgeon: Malka So, MD;  Location[facility];  Service: Urology;  Laterality: N/A;   TRANSURETHRAL RESECTION OF BLADDER TUMOR N/A 07/10/2015   Procedure: TRANSURETHRAL RESECTION OF BLADDER TUMOR (TURBT),  CYSTOSCOPY ;  Surgeon: [name], MD;  [facility];  Service: Urology;  Laterality: N/A;   TRANSURETHRAL RESECTION OF PROSTATE  03/14/2012   Procedure: TRANSURETHRAL RESECTION OF THE PROSTATE WITH GYRUS INSTRUMENTS;  Surgeon: Malka So, MD;  [facility];  Service: Urology;  Laterality: N/A;    Allergies Lipitor [atorvastatin]  Family History  Problem Relation Age of Onset   Hypertension Mother    Arthritis Mother    Lung cancer Sister    Deep vein thrombosis Father    Early death Brother     Social History Social History   Tobacco Use   Smoking status: Former Smoker    Packs/day: 1.00    Years: 35.00    Pack years: 35.00    Types: Cigarettes    Start date: 04/12/1942    Quit date: 04/13/1975    Years since quitting: 43.7   Smokeless tobacco: Never Used  Substance Use Topics   Alcohol use: Yes    Alcohol/week: 7.0 standard drinks    Types: 7 Cans of beer per week   Drug use: No    Review of Systems  Constitutional: No fever/chills. Positive confusion.  Eyes: No visual changes. ENT: No sore throat. Cardiovascular: Denies chest pain. Respiratory: Denies shortness of breath. Gastrointestinal: No abdominal pain.  No nausea, no vomiting.  No diarrhea.  No constipation. Genitourinary: Negative for dysuria. Musculoskeletal: Negative for back pain. Positive right lower leg pain.  Skin: Negative for rash. Neurological: Negative for headaches, focal weakness or numbness.  10-point ROS otherwise negative.  ____________________________________________   PHYSICAL EXAM:  VITAL SIGNS: ED Triage Vitals  Enc Vitals Group     BP 12/21/18 1148 112/62     Pulse Rate 12/21/18 1148 80     Resp 12/21/18 1148 18     Temp 12/21/18 1148 (!) 100.4 F (38 C)     Temp Source 12/21/18 1148 Oral     SpO2 12/21/18 1148 91 %   Constitutional: Alert but with some confusion. No acute distress.  Eyes: Conjunctivae are normal. PERRL.  Head: Atraumatic. Nose: No  congestion/rhinnorhea. Mouth/Throat: Mucous membranes are moist.  Neck: No stridor.  Cardiovascular: Normal rate, regular rhythm. Grossly normal heart sounds. Unable to palpate or doppler pulses in the right foot. Capillary refill is present but delayed 4 sec. Respiratory: Normal respiratory effort.  No retractions. Lungs CTAB. Gastrointestinal: Soft and nontender. No distention.  Musculoskeletal: AKA on the left. Mild RLE tenderness at the mid calf to the ankle. Normal ROM of the right knee and hip.  Neurologic:  Normal speech and language. No gross focal neurologic deficits are appreciated.  Skin:  Skin is warm, dry and intact. No rash noted.   ____________________________________________   LABS (all labs ordered are listed, but only abnormal results are displayed)  Labs Reviewed  COMPREHENSIVE METABOLIC PANEL - Abnormal; Notable for the following components:      Result Value   BUN 35 (*)    Creatinine, Ser 1.69 (*)    GFR calc non Af Amer 34 (*)    GFR calc Af Amer 39 (*)    All other components within normal limits  CBC WITH DIFFERENTIAL/PLATELET - Abnormal; Notable for the following components:   WBC 15.0 (*)    RDW 16.0 (*)    Neutro Abs 13.1 (*)    Abs Immature Granulocytes 0.09 (*)    All other components within normal limits  PROTIME-INR - Abnormal; Notable for the following components:   Prothrombin Time 22.9 (*)    INR 2.1 (*)    All other components within normal limits  LACTIC ACID, PLASMA - Abnormal; Notable for the following components:   Lactic Acid, Venous 2.2 (*)    All other components within normal limits  COMPREHENSIVE METABOLIC PANEL - Abnormal; Notable for the following components:   Glucose, Bld 123 (*)    BUN 31 (*)    Creatinine, Ser 1.59 (*)    Calcium 8.7 (*)    Total Protein 5.8 (*)    Albumin 2.9 (*)    GFR calc non Af Amer 36 (*)    GFR calc Af Amer 42 (*)    All other components within normal limits  CBC - Abnormal; Notable for the  following components:   WBC 10.7 (*)    RDW 15.7 (*)    Platelets 139 (*)    All other components within normal limits  PROTIME-INR - Abnormal; Notable for the following components:   Prothrombin Time 22.2 (*)    INR 2.0 (*)    All other components within normal limits  TROPONIN I (HIGH SENSITIVITY) - Abnormal; Notable for the following components:   Troponin I (High Sensitivity) 1,324 (*)    All other components within normal limits  TROPONIN I (HIGH SENSITIVITY) - Abnormal; Notable for the following components:   Troponin I (High Sensitivity) 1,350 (*)    All other components within normal limits  CULTURE, BLOOD (ROUTINE X 2)  CULTURE, BLOOD (ROUTINE X 2)  SARS CORONAVIRUS 2 (HOSPITAL ORDER, [facility])  URINE CULTURE  LIPASE, BLOOD  LACTIC ACID, PLASMA  LACTIC ACID, PLASMA  LACTIC ACID, PLASMA  URINALYSIS, ROUTINE W REFLEX MICROSCOPIC   ____________________________________________  EKG   EKG Interpretation  Date/Time:  Thursday December 21 2018 12:07:28 EDT Ventricular Rate:  82 PR Interval:    QRS Duration: 144 QT Interval:  399 QTC Calculation: 466 R Axis:   -9 Text Interpretation:  Sinus rhythm Ventricular trigeminy Prolonged PR interval Left bundle branch block Baseline wander in lead(s) II III aVF No STEMI  Confirmed by Nanda Quinton [phone]) on 12/21/2018 12:39:49 PM       ____________________________________________  RADIOLOGY  Dg Chest 1 View  Result Date: 12/21/2018 CLINICAL DATA:  Fever. Fell last night. Smoker. EXAM: CHEST  1 VIEW COMPARISON:  12/10/2018 and chest CT dated 07/04/2018 FINDINGS: Mildly enlarged cardiac silhouette with an interval decrease in size. Decreased prominence of the pulmonary vasculature. Stable mild prominence of the interstitial markings. No pleural fluid. Stable vascular tortuosity to the right of the upper mediastinum. Thoracic spine degenerative changes. IMPRESSION: No acute abnormality. Mild  cardiomegaly and mild chronic interstitial lung disease. Electronically Signed   By: Claudie Revering M.D.   On: 12/21/2018 13:09   Dg Ankle Complete Right  Result Date: 12/21/2018 CLINICAL DATA:  Ankle pain after a fall last night. EXAM: RIGHT ANKLE - COMPLETE 3+ VIEW COMPARISON:  None. FINDINGS: There is no evidence of fracture, dislocation, or joint effusion. A plantar calcaneal enthesophyte is noted. Vascular calcifications are seen. Soft tissues are unremarkable. IMPRESSION: No acute osseous injury. Electronically Signed   By: Zerita Boers M.D.   On: 12/21/2018 13:09   Ct Head Wo Contrast  Result Date: 12/21/2018 CLINICAL DATA:  Patient status post fall. EXAM: CT HEAD WITHOUT CONTRAST CT CERVICAL SPINE WITHOUT CONTRAST TECHNIQUE: Multidetector CT imaging of the head and cervical spine was performed following the standard protocol without intravenous contrast. Multiplanar CT image reconstructions of the cervical spine were also generated. COMPARISON:  Brain CT October 03, 2014 FINDINGS: CT HEAD FINDINGS Brain: Ventricles and sulci are appropriate for patient's age. No evidence for acute cortically based infarct, intracranial hemorrhage, mass lesion or mass-effect. Unchanged remote infarct within the left centrum semiovale. Periventricular and subcortical white matter hypodensity compatible with chronic microvascular ischemic changes. Vascular: Unremarkable Skull: Intact. Sinuses/Orbits: Opacification of the left maxillary sinus Remainder of the paranasal sinuses are well aerated. Orbits are unremarkable. Other: None. CT CERVICAL SPINE FINDINGS Alignment: Normal anatomic alignment. Skull base and vertebrae: Intact. Soft tissues and spinal canal: No prevertebral fluid or swelling. No visible canal hematoma. Disc levels: Degenerative disc disease most pronounced C6-7. No evidence for acute fracture. Upper chest: Emphysematous changes. Other: There are 2 mildly prominent lower left cervical lymph nodes (image 66;  series 8) measuring up to 10 mm, nonspecific however potentially reactive. IMPRESSION: No acute intracranial process. Atrophy and chronic microvascular ischemic changes. No acute cervical spine fracture. Electronically Signed   By: Lovey Newcomer M.D.   On: 12/21/2018 13:07   Ct Cervical Spine Wo Contrast  Result Date: 12/21/2018 CLINICAL DATA:  Patient status post fall. EXAM: CT HEAD WITHOUT CONTRAST CT CERVICAL SPINE WITHOUT CONTRAST TECHNIQUE: Multidetector CT imaging of the head and cervical spine was performed following the standard protocol without intravenous contrast. Multiplanar CT image reconstructions of the cervical spine were also generated. COMPARISON:  Brain CT October 03, 2014 FINDINGS: CT HEAD FINDINGS Brain: Ventricles and sulci are appropriate for patient's age. No evidence for acute cortically based infarct, intracranial hemorrhage, mass lesion or mass-effect. Unchanged remote infarct within the left centrum semiovale. Periventricular and subcortical white matter hypodensity compatible with chronic microvascular ischemic changes. Vascular: Unremarkable Skull: Intact. Sinuses/Orbits: Opacification of the left maxillary sinus Remainder of the paranasal sinuses are well aerated. Orbits are unremarkable. Other: None. CT CERVICAL SPINE FINDINGS Alignment: Normal anatomic alignment. Skull base and vertebrae: Intact. Soft tissues and spinal canal: No prevertebral fluid or swelling. No visible canal hematoma. Disc levels: Degenerative disc disease most pronounced C6-7. No evidence for acute fracture. Upper chest: Emphysematous changes. Other: There are 2 mildly prominent lower left cervical lymph nodes (image 66; series 8) measuring up to 10 mm, nonspecific however potentially reactive. IMPRESSION: No acute intracranial process. Atrophy and chronic microvascular ischemic changes. No acute cervical spine fracture. Electronically Signed   By: Lovey Newcomer M.D.   On: 12/21/2018 13:07   US Arterial Abi  (screening Lower Extremity)  Result Date: 12/21/2018 CLINICAL DATA:  Left BKA 2016.  Right lower extremity pain. EXAM: NONINVASIVE PHYSIOLOGIC VASCULAR STUDY OF BILATERAL LOWER EXTREMITIES TECHNIQUE: Evaluation of both lower extremities  were performed at rest, including calculation of ankle-brachial indices with single level Doppler, pressure and pulse volume recording. COMPARISON:  02/11/2015 FINDINGS: Right ABI:  0.48 (previously 0.57 by report) Left ABI:  Non calculable Right Lower Extremity: Weak monophasic arterial waveforms at the distal calf Left Lower Extremity:  Prior BKA IMPRESSION: Severe progressive right lower extremity arterial occlusive disease at rest. Electronically Signed   By: Lucrezia Europe M.D.   On: 12/21/2018 14:14    ____________________________________________   PROCEDURES  Procedure(s) performed:   Procedures  CRITICAL CARE Performed by: Margette Fast Total critical care time: 45 minutes Critical care time was exclusive of separately billable procedures and treating other patients. Critical care was necessary to treat or prevent imminent or life-threatening deterioration. Critical care was time spent personally by me on the following activities: development of treatment plan with patient and/or surrogate as well as nursing, discussions with consultants, evaluation of patient's response to treatment, examination of patient, obtaining history from patient or surrogate, ordering and performing treatments and interventions, ordering and review of laboratory studies, ordering and review of radiographic studies, pulse oximetry and re-evaluation of patient's condition.  Nanda Quinton, MD Emergency Medicine  ____________________________________________   INITIAL IMPRESSION / ASSESSMENT AND PLAN / ED COURSE  Pertinent labs & imaging results that were available during my care of the patient were reviewed by me and considered in my medical decision making (see chart for  details).   Patient presents to the emergency department for evaluation after fall last night.  Some confusion today.  Found to be febrile with mild hypoxemia here.  Unclear based on history available at this time the underlying mechanism of illnesses.  I do have some concern for acute on chronic lower limb ischemia.  He is also somewhat confused and having fever.  CT scan of the head is unremarkable.  Plain films of the right ankle, chest are normal.  ABI pending. Patient is already on Coumadin. COVID pending.  Troponin is elevated but EKG seems similar to prior and patient is able to deny chest pain actively. No HA.   ABI reviewed and discussed with Dr. Carlis Abbott. No acute intervention given additional medical issues. They can consult on the patient at [facility] and discuss options with patient/family.   Will start abx for possible developing cellulitis in the RLE but considering that may is likely due to ischemic rest pain.   Spoke with Dr. Harl Bowie regarding elevated troponin. Agree with starting heparin. He will consult.   Discussed patient's case with [facility] to request admission. Patient and family (if present) updated with plan. Care transferred to [facility] service.  I reviewed all nursing notes, vitals, pertinent old records, EKGs, labs, imaging (as available).  ____________________________________________  FINAL CLINICAL IMPRESSION(S) / ED DIAGNOSES  Final diagnoses:  Ischemia  Right leg pain  Fall, initial encounter  Fever, unspecified fever cause     MEDICATIONS GIVEN DURING THIS VISIT:  Medications  vancomycin (VANCOCIN) 500 mg in sodium chloride 0.9 % 100 mL IVPB (has no administration in time range)  aspirin EC tablet 81 mg (has no administration in time range)  isosorbide dinitrate (ISORDIL) tablet 30 mg (30 mg Oral Given 12/21/18 2243)  simvastatin (ZOCOR) tablet 10 mg (10 mg Oral Given 12/21/18 2242)  donepezil (ARICEPT) tablet 10 mg (10 mg Oral Given 12/21/18 2243)  sertraline (ZOLOFT)  tablet 50 mg (has no administration in time range)  mirabegron ER (MYRBETRIQ) tablet 25 mg (has no administration in time range)  fluticasone (FLONASE) 50  MCG/ACT nasal spray 1 spray (1 spray Each Nare Not Given 12/21/18 2243)  acetaminophen (TYLENOL) tablet 650 mg (650 mg Oral Given 12/21/18 2242)    Or  acetaminophen (TYLENOL) suppository 650 mg ( Rectal See Alternative 12/21/18 2242)  ondansetron (ZOFRAN) tablet 4 mg (has no administration in time range)    Or  ondansetron (ZOFRAN) injection 4 mg (has no administration in time range)  cefTRIAXone (ROCEPHIN) 1 g in sodium chloride 0.9 % 100 mL IVPB (has no administration in time range)  0.9 %  sodium chloride infusion ( Intravenous New Bag/Given 12/21/18 1555)  influenza vaccine adjuvanted (FLUAD) injection 0.5 mL (has no administration in time range)  ipratropium-albuterol (DUONEB) 0.5-2.5 (3) MG/3ML nebulizer solution 3 mL (has no administration in time range)  cefTRIAXone (ROCEPHIN) 2 g in sodium chloride 0.9 % 100 mL IVPB (0 g Intravenous Stopped 12/21/18 1550)  vancomycin (VANCOCIN) IVPB 750 mg/150 ml premix (0 mg Intravenous Stopped 12/21/18 1720)    Followed by  vancomycin (VANCOCIN) IVPB 750 mg/150 ml premix (0 mg Intravenous Stopped 12/21/18 1823)    Note:  This document was prepared using Dragon voice recognition software and may include unintentional dictation errors.  Nanda Quinton, MD Emergency Medicine    Kani Jobson, [facility], MD 12/22/18 [phone]

## 2018-12-21 NOTE — ED Notes (Signed)
Date and time results received: 12/21/18 1549 (use smartphrase ".now" to insert current time)  Test: Troponin Critical Value: 1350  Name of Provider Notified: Dr Cruzita Lederer Orders Received? Or Actions Taken?: NA

## 2018-12-21 NOTE — Plan of Care (Signed)

## 2018-12-21 NOTE — ED Notes (Signed)
ED TO INPATIENT HANDOFF REPORT  ED Nurse Name and Phone #: [phone]  S Name/Age/Gender Scott [name] 85 y.o. male Room/Bed: APA09/APA09  Code Status   Code Status: Prior  Home/SNF/Other Home Patient oriented to: self Is this baseline? Yes   Triage Complete: Triage complete  Chief Complaint FALL  Triage Note Patient brought in by caretaker for fall last night. Reports patient got stuck behind his bed. Patient is on blood thinners and is unknown if he hit his head. Caregiver reports some confusion today. Patient is unable to bear weight on his R lower leg.   Allergies Allergies  Allergen Reactions  . Lipitor [Atorvastatin] Other (See Comments)    Unknown rxn per pt    Level of Care/Admitting Diagnosis ED Disposition    ED Disposition Condition [place] : [place] [[id number]]  Level of Care: Telemetry Cardiac [103]  Covid Evaluation: Confirmed COVID Negative  Diagnosis: Foot pain [id number]  Admitting Physician: Caren Griffins [id number]  Attending Physician: Caren Griffins [phone]  Estimated length of stay: past midnight tomorrow  Certification:: I certify this patient will need inpatient services for at least 2 midnights  PT Class (Do Not Modify): Inpatient [101]  PT Acc Code (Do Not Modify): Private [1]       B Medical/Surgery History Past Medical History:  Diagnosis Date  . Arthritis    "all over"  . Atrial fibrillation ([place])   . Benign localized prostatic hyperplasia with lower urinary tract symptoms (LUTS)   . Bradycardia 11/28/2015   Severe-HR in the 20s to 30s following intubation for urological procedure.  Marland Kitchen CAD- non obstructive disease by cath 3/14 cardiologist-  dr hochrein  . Chronic lower back pain   . COPD (chronic obstructive pulmonary disease) ([place])   . DDD (degenerative disc disease)   . Depression   . Dyspnea    w/ exertion and lying down (raise head)  . ETOH abuse   . GERD (gastroesophageal reflux  disease)   . Glaucoma   . Glaucoma, both eyes   . History of bladder cancer urologist-- dr Jeffie Pollock   first dx 2012--  s/p TURBT's  . History of DVT of lower extremity 03/2013   bilateral   . History of pulmonary embolus (PE) 04/2013  . HTN (hypertension)   . Hyperlipidemia   . Hypertension   . LBBB (left bundle branch block)    chronic  . Non-ischemic cardiomyopathy ([place])    last echo 01-18-2017, ef 35-40%  . NSVT (nonsustained ventricular tachycardia) ([place])    a. NSVT 06/2012; NSVT also seen during 03/2013 adm. b. Med rx. Not candidate for ICD given adv age.  Marland Kitchen PAD (peripheral artery disease) ([place])   . 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 PVCs (premature ventricular contractions)   . PVD (peripheral vascular disease) ([facility])    Right ABI .75, Left .78 (2006)  . Syncope 06/2012   a. Felt to be postural syncope related to diuretics 06/2012.  Marland Kitchen Systolic and diastolic CHF, chronic (HCC) cardiologsit-  dr hochrein   a. NICM - patent cors 06/2012, EF 40% at that time. b. 03/2013 eval: EF 20-25%.  . Vision loss, left eye    due to glaucoma  . Wears glasses    Past Surgical History:  Procedure Laterality Date  . AMPUTATION Left 10/16/2014   Procedure: AMPUTATION ABOVE KNEE- LEFT;  Surgeon: [name]  Rockwell Alexandria, MD;  Location: [facility];  Service: Vascular;  Laterality: Left;  . CARDIAC CATHETERIZATION  05-11-2004  DR [facility]   MINIMAL CORANARY PLAQUE/ NORMAL LVF/ EF 55%/  NON-OBSTRUCTIVE LAD 25%  . CARDIAC CATHETERIZATION  06/18/2012   dr hochrein   mild luminal irregularities of coronaries, ef 45-50%  . CARDIOVASCULAR STRESS TEST  10-15-2010   LOW RISK NUCLEAR STUDY/ NO EVIDENCE OF ISCHEMIA/ NORMAL EF  . CATARACT EXTRACTION W/ INTRAOCULAR LENS  IMPLANT, BILATERAL Bilateral   . CYSTOSCOPY  10/07/2011   Procedure: CYSTOSCOPY;  Surgeon: Malka So, MD;  [facility];  Service:  Urology;  Laterality: N/A;  . CYSTOSCOPY N/A 10/20/2018   Procedure: CYSTOSCOPY WITH FULGURATION OF PROSTATIC URETHRAL TUMOR;  Surgeon: [name], MD;  [facility];  Service: Urology;  Laterality: N/A;  . CYSTOSCOPY WITH BIOPSY  03/14/2012   Procedure: CYSTOSCOPY WITH BIOPSY;  Surgeon: Malka So, MD;  Location: [facility];  Service: Urology;  Laterality: N/A;  WITH FULGURATION  . CYSTOSCOPY WITH BIOPSY N/A 12/09/2016   Procedure: CYSTOSCOPY WITH BIOPSY AND FULGURATION;  Surgeon: [name], MD;  [facility];  Service: Urology;  Laterality: N/A;  . CYSTOSCOPY WITH BIOPSY N/A 12/20/2017   Procedure: CYSTOSCOPY WITH BIOPSY WITH FULGURATION;  Surgeon: [name], MD;  [facility];  Service: Urology;  Laterality: N/A;  . CYSTOSCOPY WITH BIOPSY N/A 06/09/2018   Procedure: CYSTOSCOPY WITH BIOPSY AND FULGURATION;  Surgeon: [name], MD;  Location[facility];  Service: Urology;  Laterality: N/A;  . CYSTOSCOPY WITH FULGERATION N/A 01/20/2016   Procedure: CYSTOSCOPY, BIOPSY WITH FULGERATION OF URTHRAL TUMOR;  Surgeon: [name], MD;  [facility];  Service: Urology;  Laterality: N/A;  . CYSTOSCOPY WITH FULGERATION N/A 06/01/2016   Procedure: CYSTOSCOPY WITH FULGERATION urethral tumors and bladder neck;  Surgeon: [name], MD;  [facility];  Service: Urology;  Laterality: N/A;  . SHOULDER SURGERY Left 1970's  . TONSILLECTOMY    . TRANSTHORACIC ECHOCARDIOGRAM  01-18-2017   dr hochrein   moderate LVH, ef 35-40%, diffuse hypokinesis/  trivial AR and MR/  mild TR  . TRANSURETHRAL RESECTION OF BLADDER TUMOR  10/07/2011   Procedure: TRANSURETHRAL RESECTION OF BLADDER TUMOR (TURBT);  Surgeon: Malka So, MD;  Location: [facility];  Service: Urology;  Laterality: N/A;  . TRANSURETHRAL RESECTION OF BLADDER TUMOR N/A 07/10/2015   Procedure: TRANSURETHRAL RESECTION OF BLADDER TUMOR (TURBT), CYSTOSCOPY ;  Surgeon: [name], MD;  [facility];  Service: Urology;   Laterality: N/A;  . TRANSURETHRAL RESECTION OF PROSTATE  03/14/2012   Procedure: TRANSURETHRAL RESECTION OF THE PROSTATE WITH GYRUS INSTRUMENTS;  Surgeon: Malka So, MD;  [facility];  Service: Urology;  Laterality: N/A;     A IV Location/Drains/Wounds Patient Lines/Drains/Airways Status   Active Line/Drains/Airways    Name:   Placement date:   Placement time:   Site:   Days:   Peripheral IV 12/21/18 Right Antecubital   12/21/18    1228    Antecubital   less than 1   Peripheral IV 12/21/18 Left Forearm   12/21/18    1613    Forearm   less than 1   Incision (Closed) 01/20/16 Perineum Other (Comment)   01/20/16    1116     1066   Incision (Closed) 12/09/16 Perineum   12/09/16    1134     742   Incision (Closed) 06/09/18 Perineum   06/09/18  1223     195   Incision (Closed) 10/20/18 Penis   10/20/18    1249     62   Wound / Incision (Open or Dehisced) 12/10/18 Non-pressure wound Leg Right   12/10/18    1828    Leg   11          Intake/Output Last 24 hours  Intake/Output Summary (Last 24 hours) at 12/21/2018 1906 Last data filed at 12/21/2018 1823 Gross per 24 hour  Intake 407.53 ml  Output -  Net 407.53 ml    Labs/Imaging Results for orders placed or performed during the hospital encounter of 12/21/18 (from the past 48 hour(s))  Comprehensive metabolic panel     Status: Abnormal   Collection Time: 12/21/18 12:09 PM  Result Value Ref Range   Sodium 141 135 - 145 mmol/L   Potassium 3.7 3.5 - 5.1 mmol/L   Chloride 100 98 - 111 mmol/L   CO2 31 22 - 32 mmol/L   Glucose, Bld 89 70 - 99 mg/dL   BUN 35 (H) 8 - 23 mg/dL   Creatinine, Ser 1.69 (H) 0.61 - 1.24 mg/dL   Calcium 8.9 8.9 - 10.3 mg/dL   Total Protein 6.5 6.5 - 8.1 g/dL   Albumin 3.6 3.5 - 5.0 g/dL   AST 18 15 - 41 U/L   ALT 20 0 - 44 U/L   Alkaline Phosphatase 122 38 - 126 U/L   Total Bilirubin 1.2 0.3 - 1.2 mg/dL   GFR calc non Af Amer 34 (L) >60 mL/min   GFR calc Af Amer 39 (L) >60 mL/min   Anion gap 10 5 -  15    Comment: Performed at [facility], [address], [place], [place] [address]  Lipase, blood     Status: None   Collection Time: 12/21/18 12:09 PM  Result Value Ref Range   Lipase 40 11 - 51 U/L    Comment: Performed at [facility], [address] [place], [place] [address]  Troponin I (High Sensitivity)     Status: Abnormal   Collection Time: 12/21/18 12:09 PM  Result Value Ref Range   Troponin I (High Sensitivity) 1,324 (HH) <18 ng/L    Comment: CRITICAL RESULT CALLED TO, READ BACK BY AND VERIFIED WITH: Geni Skorupski,H@1338  BY MATTHEWS, B 9.10.2020 (NOTE) Elevated high sensitivity troponin I (hsTnI) values and significant  changes across serial measurements may suggest ACS but many other  chronic and acute conditions are known to elevate hsTnI results.  Refer to the Links section for chest pain algorithms and additional  guidance. Performed at [facility], [address] [place], [place] [address]   CBC with Differential     Status: Abnormal   Collection Time: 12/21/18 12:09 PM  Result Value Ref Range   WBC 15.0 (H) 4.0 - 10.5 K/uL   RBC 5.26 4.22 - 5.81 MIL/uL   Hemoglobin 14.5 13.0 - 17.0 g/dL   HCT 46.1 39.0 - 52.0 %   MCV 87.6 80.0 - 100.0 fL   MCH 27.6 26.0 - 34.0 pg   MCHC 31.5 30.0 - 36.0 g/dL   RDW 16.0 (H) 11.5 - 15.5 %   Platelets 163 150 - 400 K/uL   nRBC 0.0 0.0 - 0.2 %   Neutrophils Relative % 86 %   Neutro Abs 13.1 (H) 1.7 - 7.7 K/uL   Lymphocytes Relative 7 %   Lymphs Abs 1.0 0.7 - 4.0 K/uL   Monocytes Relative 6 %   Monocytes  Absolute 0.8 0.1 - 1.0 K/uL   Eosinophils Relative 0 %   Eosinophils Absolute 0.0 0.0 - 0.5 K/uL   Basophils Relative 0 %   Basophils Absolute 0.0 0.0 - 0.1 K/uL   Immature Granulocytes 1 %   Abs Immature Granulocytes 0.09 (H) 0.00 - 0.07 K/uL    Comment: Performed at [facility], [address] [place], [place] [address]  Protime-INR     Status: Abnormal   Collection Time: 12/21/18 12:09 PM  Result Value Ref Range    Prothrombin Time 22.9 (H) 11.4 - 15.2 seconds   INR 2.1 (H) 0.8 - 1.2    Comment: (NOTE) INR goal varies based on device and disease states. Performed at [facility], [address] [place], [place] [address]   Lactic acid, plasma     Status: None   Collection Time: 12/21/18 12:09 PM  Result Value Ref Range   Lactic Acid, Venous 1.7 0.5 - 1.9 mmol/L    Comment: Performed at [facility], [address] [place], [place] [address]  Culture, blood (routine x 2)     Status: None (Preliminary result)   Collection Time: 12/21/18 12:22 PM   Specimen: BLOOD RIGHT ARM  Result Value Ref Range   Specimen Description BLOOD RIGHT ARM DRAWN BY RN    Special Requests      BOTTLES DRAWN AEROBIC AND ANAEROBIC Blood Culture adequate volume Performed at [facility], [address] [place], [place] [address]    Culture PENDING    Report Status PENDING   SARS Coronavirus 2 [facility] order, Performed in [facility] hospital lab) Nasopharyngeal Nasopharyngeal Swab     Status: None   Collection Time: 12/21/18  1:39 PM   Specimen: Nasopharyngeal Swab  Result Value Ref Range   SARS Coronavirus 2 NEGATIVE NEGATIVE    Comment: (NOTE) If result is NEGATIVE SARS-CoV-2 target nucleic acids are NOT DETECTED. The SARS-CoV-2 RNA is generally detectable in upper and lower  respiratory specimens during the acute phase of infection. The lowest  concentration of SARS-CoV-2 viral copies this assay can detect is 250  copies / mL. A negative result does not preclude SARS-CoV-2 infection  and should not be used as the sole basis for treatment or other  patient management decisions.  A negative result may occur with  improper specimen collection / handling, submission of specimen other  than nasopharyngeal swab, presence of viral mutation(s) within the  areas targeted by this assay, and inadequate number of viral copies  (<250 copies / mL). A negative result must be combined with clinical  observations, patient  history, and epidemiological information. If result is POSITIVE SARS-CoV-2 target nucleic acids are DETECTED. The SARS-CoV-2 RNA is generally detectable in upper and lower  respiratory specimens dur ing the acute phase of infection.  Positive  results are indicative of active infection with SARS-CoV-2.  Clinical  correlation with patient history and other diagnostic information is  necessary to determine patient infection status.  Positive results do  not rule out bacterial infection or co-infection with other viruses. If result is PRESUMPTIVE POSTIVE SARS-CoV-2 nucleic acids MAY BE PRESENT.   A presumptive positive result was obtained on the submitted specimen  and confirmed on repeat testing.  While 2019 novel coronavirus  (SARS-CoV-2) nucleic acids may be present in the submitted sample  additional confirmatory testing may be necessary for epidemiological  and / or clinical management purposes  to differentiate between  SARS-CoV-2 and other Sarbecovirus currently known to infect humans.  If clinically indicated additional testing with an alternate test  methodology [phone]) is advised. The SARS-CoV-2 RNA is generally  detectable in upper and lower respiratory sp ecimens during the acute  phase of infection. The expected result is Negative. Fact Sheet for Patients:  StrictlyIdeas.no Fact Sheet for Healthcare Providers: BankingDealers.co.za This test is not yet approved or cleared by the [place] FDA and has been authorized for detection and/or diagnosis of SARS-CoV-2 by FDA under an Emergency Use Authorization (EUA).  This EUA will remain in effect (meaning this test can be used) for the duration of the COVID-19 declaration under Section 564(b)(1) of the Act, 21 U.S.C. section 360bbb-3(b)(1), unless the authorization is terminated or revoked sooner. Performed at [facility], [address] [place], [place] [address]   Lactic  acid, plasma     Status: Abnormal   Collection Time: 12/21/18  2:22 PM  Result Value Ref Range   Lactic Acid, Venous 2.2 (HH) 0.5 - 1.9 mmol/L    Comment: CRITICAL RESULT CALLED TO, READ BACK BY AND VERIFIED WITH: Jaqulyn Chancellor,H@1532  BY MATTHEWS, B 9.10.2020 Performed at [facility], [address] [place], [place] [address]   Culture, blood (routine x 2)     Status: None (Preliminary result)   Collection Time: 12/21/18  2:22 PM   Specimen: BLOOD LEFT WRIST  Result Value Ref Range   Specimen Description BLOOD LEFT WRIST    Special Requests      BOTTLES DRAWN AEROBIC ONLY Blood Culture results may not be optimal due to an inadequate volume of blood received in culture bottles Performed at [facility], [address], [place], [place] [address]    Culture PENDING    Report Status PENDING   Troponin I (High Sensitivity)     Status: Abnormal   Collection Time: 12/21/18  2:22 PM  Result Value Ref Range   Troponin I (High Sensitivity) 1,350 (HH) <18 ng/L    Comment: CRITICAL RESULT CALLED TO, READ BACK BY AND VERIFIED WITH: Tailor Westfall,H ON 12/21/18 AT 1550 BY LOY,C (NOTE) Elevated high sensitivity troponin I (hsTnI) values and significant  changes across serial measurements may suggest ACS but many other  chronic and acute conditions are known to elevate hsTnI results.  Refer to the Links section for chest pain algorithms and additional  guidance. Performed at [facility], [address], [place], [place] [address]    *Note: Due to a large number of results and/or encounters for the requested time period, some results have not been displayed. A complete set of results can be found in Results Review.   Dg Chest 1 View  Result Date: 12/21/2018 CLINICAL DATA:  Fever. Fell last night. Smoker. EXAM: CHEST  1 VIEW COMPARISON:  12/10/2018 and chest CT dated 07/04/2018 FINDINGS: Mildly enlarged cardiac silhouette with an interval decrease in size. Decreased prominence of the pulmonary vasculature.  Stable mild prominence of the interstitial markings. No pleural fluid. Stable vascular tortuosity to the right of the upper mediastinum. Thoracic spine degenerative changes. IMPRESSION: No acute abnormality. Mild cardiomegaly and mild chronic interstitial lung disease. Electronically Signed   By: Claudie Revering M.D.   On: 12/21/2018 13:09   Dg Ankle Complete Right  Result Date: 12/21/2018 CLINICAL DATA:  Ankle pain after a fall last night. EXAM: RIGHT ANKLE - COMPLETE 3+ VIEW COMPARISON:  None. FINDINGS: There is no evidence of fracture, dislocation, or joint effusion. A plantar calcaneal enthesophyte is noted. Vascular calcifications are seen. Soft tissues are unremarkable. IMPRESSION: No acute osseous injury.  Electronically Signed   By: Zerita Boers M.D.   On: 12/21/2018 13:09   Ct Head Wo Contrast  Result Date: 12/21/2018 CLINICAL DATA:  Patient status post fall. EXAM: CT HEAD WITHOUT CONTRAST CT CERVICAL SPINE WITHOUT CONTRAST TECHNIQUE: Multidetector CT imaging of the head and cervical spine was performed following the standard protocol without intravenous contrast. Multiplanar CT image reconstructions of the cervical spine were also generated. COMPARISON:  Brain CT October 03, 2014 FINDINGS: CT HEAD FINDINGS Brain: Ventricles and sulci are appropriate for patient's age. No evidence for acute cortically based infarct, intracranial hemorrhage, mass lesion or mass-effect. Unchanged remote infarct within the left centrum semiovale. Periventricular and subcortical white matter hypodensity compatible with chronic microvascular ischemic changes. Vascular: Unremarkable Skull: Intact. Sinuses/Orbits: Opacification of the left maxillary sinus Remainder of the paranasal sinuses are well aerated. Orbits are unremarkable. Other: None. CT CERVICAL SPINE FINDINGS Alignment: Normal anatomic alignment. Skull base and vertebrae: Intact. Soft tissues and spinal canal: No prevertebral fluid or swelling. No visible canal  hematoma. Disc levels: Degenerative disc disease most pronounced C6-7. No evidence for acute fracture. Upper chest: Emphysematous changes. Other: There are 2 mildly prominent lower left cervical lymph nodes (image 66; series 8) measuring up to 10 mm, nonspecific however potentially reactive. IMPRESSION: No acute intracranial process. Atrophy and chronic microvascular ischemic changes. No acute cervical spine fracture. Electronically Signed   By: Lovey Newcomer M.D.   On: 12/21/2018 13:07   Ct Cervical Spine Wo Contrast  Result Date: 12/21/2018 CLINICAL DATA:  Patient status post fall. EXAM: CT HEAD WITHOUT CONTRAST CT CERVICAL SPINE WITHOUT CONTRAST TECHNIQUE: Multidetector CT imaging of the head and cervical spine was performed following the standard protocol without intravenous contrast. Multiplanar CT image reconstructions of the cervical spine were also generated. COMPARISON:  Brain CT October 03, 2014 FINDINGS: CT HEAD FINDINGS Brain: Ventricles and sulci are appropriate for patient's age. No evidence for acute cortically based infarct, intracranial hemorrhage, mass lesion or mass-effect. Unchanged remote infarct within the left centrum semiovale. Periventricular and subcortical white matter hypodensity compatible with chronic microvascular ischemic changes. Vascular: Unremarkable Skull: Intact. Sinuses/Orbits: Opacification of the left maxillary sinus Remainder of the paranasal sinuses are well aerated. Orbits are unremarkable. Other: None. CT CERVICAL SPINE FINDINGS Alignment: Normal anatomic alignment. Skull base and vertebrae: Intact. Soft tissues and spinal canal: No prevertebral fluid or swelling. No visible canal hematoma. Disc levels: Degenerative disc disease most pronounced C6-7. No evidence for acute fracture. Upper chest: Emphysematous changes. Other: There are 2 mildly prominent lower left cervical lymph nodes (image 66; series 8) measuring up to 10 mm, nonspecific however potentially reactive.  IMPRESSION: No acute intracranial process. Atrophy and chronic microvascular ischemic changes. No acute cervical spine fracture. Electronically Signed   By: Lovey Newcomer M.D.   On: 12/21/2018 13:07   US Arterial Abi (screening Lower Extremity)  Result Date: 12/21/2018 CLINICAL DATA:  Left BKA 2016.  Right lower extremity pain. EXAM: NONINVASIVE PHYSIOLOGIC VASCULAR STUDY OF BILATERAL LOWER EXTREMITIES TECHNIQUE: Evaluation of both lower extremities were performed at rest, including calculation of ankle-brachial indices with single level Doppler, pressure and pulse volume recording. COMPARISON:  02/11/2015 FINDINGS: Right ABI:  0.48 (previously 0.57 by report) Left ABI:  Non calculable Right Lower Extremity: Weak monophasic arterial waveforms at the distal calf Left Lower Extremity:  Prior BKA IMPRESSION: Severe progressive right lower extremity arterial occlusive disease at rest. Electronically Signed   By: Lucrezia Europe M.D.   On: 12/21/2018 14:14    Pending  Labs [employer] (From admission, onward)    Start     Ordered   12/22/18 0500  Protime-INR  Tomorrow morning,   R     12/21/18 1511   12/22/18 0500  Creatinine, serum  Every Mon-Wed-Fri (0500),   R (with STAT occurrences)     12/21/18 1518   12/21/18 1209  Urinalysis, Routine w reflex microscopic  ONCE - STAT,   STAT     12/21/18 1209   12/21/18 1209  Urine culture  ONCE - STAT,   STAT    Question:  Patient immune status  Answer:  Normal   12/21/18 1209   Signed and Held  Comprehensive metabolic panel  Tomorrow morning,   R     Signed and Held   Signed and Held  CBC  Tomorrow morning,   R     Signed and Held   Signed and Held  Protime-INR  Tomorrow morning,   R     Signed and Held          Vitals/Pain Today's Vitals   12/21/18 1730 12/21/18 1739 12/21/18 1852 12/21/18 1856  BP: 122/62 122/62 130/67   Pulse: 74 73  97  Resp: 15 (!) 25    Temp:      TempSrc:      SpO2: (!) 86% 91%  92%  PainSc:        Isolation  Precautions No active isolations  Medications Medications  vancomycin (VANCOCIN) 500 mg in sodium chloride 0.9 % 100 mL IVPB (has no administration in time range)  0.9 %  sodium chloride infusion ( Intravenous New Bag/Given 12/21/18 1555)  cefTRIAXone (ROCEPHIN) 2 g in sodium chloride 0.9 % 100 mL IVPB (0 g Intravenous Stopped 12/21/18 1550)  vancomycin (VANCOCIN) IVPB 750 mg/150 ml premix (0 mg Intravenous Stopped 12/21/18 1720)    Followed by  vancomycin (VANCOCIN) IVPB 750 mg/150 ml premix (0 mg Intravenous Stopped 12/21/18 1823)    Mobility  High fall risk   Focused Assessments    R Recommendations: See Admitting Provider Note  Report given to:   Additional Notes:

## 2018-12-21 NOTE — Progress Notes (Signed)
Pharmacy Antibiotic Note  Scott [name] is a 85 y.o. male admitted on 12/21/2018 with cellulitis.  Pharmacy has been consulted for Vancomycin dosing.  Plan: Vancomycin 1500 mg IV x 1 dose. Vancomycin 500 mg IV every 24 hours.  Goal trough 10-15 mcg/mL.  Monitor labs, c/s, and vanco level as indicated.     Temp (24hrs), Avg:100.4 F (38 C), Min:100.4 F (38 C), Max:100.4 F (38 C)  Recent Labs  Lab 12/20/18 1117 12/21/18 1209  WBC 7.9 15.0*  CREATININE 1.43* 1.69*  LATICACIDVEN  --  1.7    Estimated Creatinine Clearance: 24.4 mL/min (A) (by C-G formula based on SCr of 1.69 mg/dL (H)).    Allergies  Allergen Reactions  . Lipitor [Atorvastatin] Other (See Comments)    Unknown rxn per pt    Antimicrobials this admission: Vanco 9/10 >>  CTX 9/10 x 1 dose  Dose adjustments this admission: [place]  Microbiology results: 9/10 BCx: pending 9/10 UCx: pending    Thank you for allowing pharmacy to be a part of this patient's care.  Margot Ables, PharmD Clinical Pharmacist 12/21/2018 3:19 PM

## 2018-12-21 NOTE — H&P (Signed)
History and Physical    Scott [name] [id number] DOB: 07/02/1921 DOA: 12/21/2018  I have briefly reviewed the patient's prior medical records in [place]  PCP: Dettinger, Fransisca Kaufmann, MD  Patient coming from: home  Chief Complaint: foot pain  HPI: Scott [name] is a 85 y.o. 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 fib, hypertension, chronic kidney disease stage III, COPD, bladder cancer, CAD, left AKA who is being brought to the hospital by family due to mild confusion this morning.  They also report that last night, patient was found down caught in between the bed and the wall.  Patient denies any syncopal episodes and tells me he lost balance and fell.  He has a history of AKA but uses his right foot to pivot.  He denies any chest pain, denies any abdominal pain, nausea or vomiting.  He denies any fever or chills.  He does complain of right foot pain when he touch it but not at rest.  He is alert and oriented x4 however not forthcoming with answering questions.  The nephew, who is the main caregiver is at bedside and contributed to the story.  He was recently admitted to the hospital and discharge 10 days ago due to COPD exacerbation and pulmonary edema.  ED Course: In the emergency room patient is found to have a temp of 100.4, slightly tachypneic, the rest of the vital signs are stable. His blood work shows a creatinine of 1.69, high-sensitivity troponin 1324, lactic acid initially 1.7 on repeat 2.2.  He has a white count of 15.  Covid was negative.  CT head and C-spine are negative.  Ankle x-ray negative. Chest x-ray without acute abnormalities, mild cardiomegaly and chronic interstitial lung changes.  He had an ABI given poor pulses on the right lower extremity which showed severe progressive arterial occlusive disease at rest.  Dr. Carlis Abbott with vascular surgery and Dr. Harl Bowie with cardiology were consulted.  Patient will  be admitted to [facility] for vascular surgery to be able to consult on him.   Review of Systems: As per HPI otherwise 10 point review of systems negative.   Past Medical History:  Diagnosis Date   Arthritis    "all over"   Atrial fibrillation ([place])    Benign localized prostatic hyperplasia with lower urinary tract symptoms (LUTS)    Bradycardia 11/28/2015   Severe-HR in the 20s to 30s following intubation for urological procedure.   CAD- non obstructive disease by cath 3/14 cardiologist-  dr hochrein   Chronic lower back pain    COPD (chronic obstructive pulmonary disease) ([place])    DDD (degenerative disc disease)    Depression    Dyspnea    w/ exertion and lying down (raise head)   ETOH abuse    GERD (gastroesophageal reflux disease)    Glaucoma    Glaucoma, both eyes    History of bladder cancer urologist-- dr Jeffie Pollock   first dx 2012--  s/p TURBT's   History of DVT of lower extremity 03/2013   bilateral    History of pulmonary embolus (PE) 04/2013   HTN (hypertension)    Hyperlipidemia    Hypertension    LBBB (left bundle branch block)    chronic   Non-ischemic cardiomyopathy ([place])    last echo 01-18-2017, ef 35-40%   NSVT (nonsustained ventricular tachycardia) ([place])    a. NSVT 06/2012; NSVT also seen during 03/2013 adm. b. Med rx.  Not candidate for ICD given adv age.   PAD (peripheral artery disease) (HCC)    Popliteal artery aneurysm, bilateral (HCC)    DOCUMENTED CHRONIC PARTIAL OCCLUSION--  PT DENIES CLAUDICATION OR ANY OTHER SYMPTOMS   PVCs (premature ventricular contractions)    PVD (peripheral vascular disease) (HCC)    Right ABI .75, Left .78 (2006)   Syncope 06/2012   a. Felt to be postural syncope related to diuretics 06/2012.   Systolic and diastolic CHF, chronic (HCC) cardiologsit-  dr hochrein   a. NICM - patent cors 06/2012, EF 40% at that time. b. 03/2013 eval: EF 20-25%.   Vision loss, left eye    due to glaucoma   Wears  glasses     Past Surgical History:  Procedure Laterality Date   AMPUTATION Left 10/16/2014   Procedure: AMPUTATION ABOVE KNEE- LEFT;  Surgeon: Mal Misty, MD;  Location: [facility];  Service: Vascular;  Laterality: Left;   CARDIAC CATHETERIZATION  05-11-2004  DR [place]el[place] PLAQUE/ NORMAL LVF/ EF 55%/  NON-OBSTRUCTIVE LAD 25%   CARDIAC CATHETERIZATION  06/18/2012   dr hochrein   mild luminal irregularities of coronaries, ef 45-50%   CARDIOVASCULAR STRESS TEST  10-15-2010   LOW RISK NUCLEAR STUDY/ NO EVIDENCE OF ISCHEMIA/ NORMAL EF   CATARACT EXTRACTION W/ INTRAOCULAR LENS  IMPLANT, BILATERAL Bilateral    CYSTOSCOPY  10/07/2011   Procedure: CYSTOSCOPY;  Surgeon: Malka So, MD;  Location: [facility];  Service: Urology;  Laterality: N/A;   CYSTOSCOPY N/A 10/20/2018   Procedure: CYSTOSCOPY WITH FULGURATION OF PROSTATIC URETHRAL TUMOR;  Surgeon: [name], MD;  Location: [facility];  Service: Urology;  Laterality: N/A;   CYSTOSCOPY WITH BIOPSY  03/14/2012   Procedure: CYSTOSCOPY WITH BIOPSY;  Surgeon: Malka So, MD;  Location: [facility];  Service: Urology;  Laterality: N/A;  WITH FULGURATION   CYSTOSCOPY WITH BIOPSY N/A 12/09/2016   Procedure: CYSTOSCOPY WITH BIOPSY AND FULGURATION;  Surgeon: [name], MD;  Location: [facility];  Service: Urology;  Laterality: N/A;   CYSTOSCOPY WITH BIOPSY N/A 12/20/2017   Procedure: CYSTOSCOPY WITH BIOPSY WITH FULGURATION;  Surgeon: [name], MD;  Location: [facility];  Service: Urology;  Laterality: N/A;   CYSTOSCOPY WITH BIOPSY N/A 06/09/2018   Procedure: CYSTOSCOPY WITH BIOPSY AND FULGURATION;  Surgeon: [name], MD;  Location: [facility];  Service: Urology;  Laterality: N/A;   CYSTOSCOPY WITH FULGERATION N/A 01/20/2016   Procedure: CYSTOSCOPY, BIOPSY WITH FULGERATION OF URTHRAL TUMOR;  Surgeon: [name], MD;  [facility];  Service: Urology;  Laterality: N/A;   CYSTOSCOPY WITH FULGERATION N/A 06/01/2016   Procedure:  CYSTOSCOPY WITH FULGERATION urethral tumors and bladder neck;  Surgeon: [name], MD;  Location[facility];  Service: Urology;  Laterality: N/A;   SHOULDER SURGERY Left 1970's   TONSILLECTOMY     TRANSTHORACIC ECHOCARDIOGRAM  01-18-2017   dr hochrein   moderate LVH, ef 35-40%, diffuse hypokinesis/  trivial AR and MR/  mild TR   TRANSURETHRAL RESECTION OF BLADDER TUMOR  10/07/2011   Procedure: TRANSURETHRAL RESECTION OF BLADDER TUMOR (TURBT);  Surgeon: Malka So, MD;  Location: [facility];  Service: Urology;  Laterality: N/A;   TRANSURETHRAL RESECTION OF BLADDER TUMOR N/A 07/10/2015   Procedure: TRANSURETHRAL RESECTION OF BLADDER TUMOR (TURBT), CYSTOSCOPY ;  Surgeon: [name], MD;  [facility];  Service: Urology;  Laterality: N/A;   TRANSURETHRAL RESECTION OF PROSTATE  03/14/2012   Procedure: TRANSURETHRAL RESECTION  OF THE PROSTATE WITH GYRUS INSTRUMENTS;  Surgeon: Malka So, MD;  Location[facility];  Service: Urology;  Laterality: N/A;     reports that he quit smoking about 43 years ago. His smoking use included cigarettes. He started smoking about 76 years ago. He has a 35.00 pack-year smoking history. He has never used smokeless tobacco. He reports current alcohol use of about 7.0 standard drinks of alcohol per week. He reports that he does not use drugs.  Allergies  Allergen Reactions   Lipitor [Atorvastatin] Other (See Comments)    Unknown rxn per pt    Family History  Problem Relation Age of Onset   Hypertension Mother    Arthritis Mother    Lung cancer Sister    Deep vein thrombosis Father    Early death Brother     Prior to Admission medications   Medication Sig Start Date End Date Taking? Authorizing Provider  albuterol (PROVENTIL) (2.5 MG/3ML) 0.083% nebulizer solution NEBULIZE 1 VIAL EVERY 6 HOURS AS NEEDED FOR WHEEZING OR SHORTNESS OF BREATH Patient taking differently: Take 2.5 mg by nebulization every 6 (six) hours as needed for wheezing  or shortness of breath.  09/07/18   Dettinger, Fransisca Kaufmann, MD  aspirin EC 81 MG EC tablet Take 1 tablet (81 mg total) by mouth daily. 12/12/18   Orson Eva, MD  COMBIVENT RESPIMAT 20-100 MCG/ACT AERS respimat 1 PUFF EVERY 4 HOURS AS NEEDED FOR COUGH, WHEEZING, OR SHORTNESS OF BREATH Patient not taking: Reported on 10/11/2018 08/23/18   Dettinger, Fransisca Kaufmann, MD  donepezil (ARICEPT) 10 MG tablet TAKE ONE TABLET AT BEDTIME 11/10/18   Dettinger, Fransisca Kaufmann, MD  febuxostat (ULORIC) 40 MG tablet TAKE 1 TABLET DAILY 11/10/18   Dettinger, Fransisca Kaufmann, MD  fluticasone (FLONASE) 50 MCG/ACT nasal spray Place 1 spray into both nostrils daily as needed for allergies or rhinitis. Patient taking differently: Place 1 spray into both nostrils at bedtime.  09/07/18   Dettinger, Fransisca Kaufmann, MD  folic acid (FOLVITE) 1 MG tablet TAKE 1 TABLET DAILY 11/23/18   Dettinger, Fransisca Kaufmann, MD  furosemide (LASIX) 40 MG tablet Take 1 tablet (40 mg total) by mouth 2 (two) times daily. 12/05/18   Dettinger, Fransisca Kaufmann, MD  gabapentin (NEURONTIN) 100 MG capsule TAKE 2 CAPSULES TWICE A DAY 11/10/18   Dettinger, Fransisca Kaufmann, MD  ipratropium (ATROVENT) 0.02 % nebulizer solution Take 0.5 mg by nebulization every 6 (six) hours as needed for wheezing or shortness of breath.    [provider]  isosorbide dinitrate (ISORDIL) 30 MG tablet Take 1 tablet (30 mg total) by mouth 2 (two) times daily. 12/12/18   Orson Eva, MD  metoprolol succinate (TOPROL-XL) 50 MG 24 hr tablet Take 1 tablet (50 mg total) by mouth every morning. (Appt w/ PCP for blood pressure) 08/02/18   Dettinger, Fransisca Kaufmann, MD  MYRBETRIQ 25 MG TB24 tablet Take 25 mg by mouth every morning.  09/20/16   [provider]  pantoprazole (PROTONIX) 40 MG tablet TAKE 1 TABLET DAILY 11/23/18   Evelina Dun A, FNP  predniSONE (DELTASONE) 10 MG tablet Take 6 tablets (60 mg total) by mouth daily with breakfast. And decrease by one tablet daily 12/13/18   Tat, [name], MD  PROAIR HFA 108 (90 Base) MCG/ACT  inhaler 2 PUFFS EVERY 6 HOURS AS NEEDED FOR WHEEZING OR SHORTNESS OF BREATH Patient taking differently: Inhale 2 puffs into the lungs every 6 (six) hours as needed for wheezing or shortness of breath.  05/02/18  Dettinger, Fransisca Kaufmann, MD  sertraline (ZOLOFT) 50 MG tablet TAKE 1 TABLET IN THE MORNING 11/23/18   Dettinger, Fransisca Kaufmann, MD  simvastatin (ZOCOR) 10 MG tablet Take 1 tablet (10 mg total) by mouth at bedtime. 11/30/18   Dettinger, Fransisca Kaufmann, MD  warfarin (COUMADIN) 2.5 MG tablet Take 1-2 tablets (2.5-5 mg total) by mouth See admin instructions. Take 2.5 mg by mouth daily except Mondays and Fridays take 5 mg 10/11/18   Dettinger, Fransisca Kaufmann, MD    Physical Exam: Vitals:   12/21/18 1438 12/21/18 1439 12/21/18 1500 12/21/18 1501  BP: (!) 151/108  (!) 146/61   Pulse:  78  (!) 56  Resp:  15 19 (!) 31  Temp:      TempSrc:      SpO2:  91%  91%    Constitutional: NAD, calm, comfortable Eyes: PERRL, lids and conjunctivae normal ENMT: Mucous membranes are moist. Neck: normal, supple Respiratory: Diminished overall, no wheezing, no crackles, good air movement Cardiovascular: Regular rate and rhythm, no murmurs / rubs / gallops. No extremity edema.  Abdomen: no tenderness, no masses palpated. Bowel sounds positive.  Musculoskeletal: no clubbing / cyanosis. Normal muscle tone.  Left BKA Skin: Right foot warm, slightly erythematous, tender to palpation. Neurologic: CN 2-12 grossly intact. Strength 5/5 in all 3  Psychiatric: Normal judgment and insight. Alert and oriented x 3. N  Labs on Admission: I have personally reviewed following labs and imaging studies  CBC: Recent Labs  Lab 12/20/18 1117 12/21/18 1209  WBC 7.9 15.0*  NEUTROABS 5.4 13.1*  HGB 13.9 14.5  HCT 42.3 46.1  MCV 85 87.6  PLT 194 [id number]   Basic Metabolic Panel: Recent Labs  Lab 12/20/18 1117 12/21/18 1209  NA 146* 141  K 3.8 3.7  CL 102 100  CO2 29 31  GLUCOSE 75 89  BUN 32 35*  CREATININE 1.43* 1.69*  CALCIUM 9.1  8.9   GFR: Estimated Creatinine Clearance: 24.4 mL/min (A) (by C-G formula based on SCr of 1.69 mg/dL (H)). Liver Function Tests: Recent Labs  Lab 12/20/18 1117 12/21/18 1209  AST 15 18  ALT 21 20  ALKPHOS 153* 122  BILITOT 0.3 1.2  PROT 5.8* 6.5  ALBUMIN 3.9 3.6   Recent Labs  Lab 12/21/18 1209  LIPASE 40   No results for input(s): AMMONIA in the last 168 hours. Coagulation Profile: Recent Labs  Lab 12/21/18 1209  INR 2.1*   Cardiac Enzymes: No results for input(s): CKTOTAL, CKMB, CKMBINDEX, TROPONINI in the last 168 hours. BNP (last 3 results) No results for input(s): PROBNP in the last 8760 hours. HbA1C: No results for input(s): HGBA1C in the last 72 hours. CBG: No results for input(s): GLUCAP in the last 168 hours. Lipid Profile: No results for input(s): CHOL, HDL, LDLCALC, TRIG, CHOLHDL, LDLDIRECT in the last 72 hours. Thyroid Function Tests: No results for input(s): TSH, T4TOTAL, FREET4, T3FREE, THYROIDAB in the last 72 hours. Anemia Panel: No results for input(s): VITAMINB12, FOLATE, FERRITIN, TIBC, IRON, RETICCTPCT in the last 72 hours. Urine analysis:    Component Value Date/Time   COLORURINE YELLOW 12/10/2018 1257   APPEARANCEUR CLEAR 12/10/2018 1257   APPEARANCEUR Clear 11/08/2018 0000   LABSPEC 1.011 12/10/2018 1257   PHURINE 6.0 12/10/2018 1257   GLUCOSEU NEGATIVE 12/10/2018 1257   [place] 12/10/2018 [place] 12/10/2018 1257   BILIRUBINUR Negative 11/08/2018 0000   [place] 12/10/2018 1257   PROTEINUR 30 (A) 12/10/2018 1257   UROBILINOGEN  negative 05/19/2015 1203   UROBILINOGEN 1.0 10/22/2014 1804   NITRITE NEGATIVE 12/10/2018 1257   LEUKOCYTESUR NEGATIVE 12/10/2018 1257     Radiological Exams on Admission: Dg Chest 1 View  Result Date: 12/21/2018 CLINICAL DATA:  Fever. Fell last night. Smoker. EXAM: CHEST  1 VIEW COMPARISON:  12/10/2018 and chest CT dated 07/04/2018 FINDINGS: Mildly enlarged cardiac  silhouette with an interval decrease in size. Decreased prominence of the pulmonary vasculature. Stable mild prominence of the interstitial markings. No pleural fluid. Stable vascular tortuosity to the right of the upper mediastinum. Thoracic spine degenerative changes. IMPRESSION: No acute abnormality. Mild cardiomegaly and mild chronic interstitial lung disease. Electronically Signed   By: Claudie Revering M.D.   On: 12/21/2018 13:09   Dg Ankle Complete Right  Result Date: 12/21/2018 CLINICAL DATA:  Ankle pain after a fall last night. EXAM: RIGHT ANKLE - COMPLETE 3+ VIEW COMPARISON:  None. FINDINGS: There is no evidence of fracture, dislocation, or joint effusion. A plantar calcaneal enthesophyte is noted. Vascular calcifications are seen. Soft tissues are unremarkable. IMPRESSION: No acute osseous injury. Electronically Signed   By: Zerita Boers M.D.   On: 12/21/2018 13:09   Ct Head Wo Contrast  Result Date: 12/21/2018 CLINICAL DATA:  Patient status post fall. EXAM: CT HEAD WITHOUT CONTRAST CT CERVICAL SPINE WITHOUT CONTRAST TECHNIQUE: Multidetector CT imaging of the head and cervical spine was performed following the standard protocol without intravenous contrast. Multiplanar CT image reconstructions of the cervical spine were also generated. COMPARISON:  Brain CT October 03, 2014 FINDINGS: CT HEAD FINDINGS Brain: Ventricles and sulci are appropriate for patient's age. No evidence for acute cortically based infarct, intracranial hemorrhage, mass lesion or mass-effect. Unchanged remote infarct within the left centrum semiovale. Periventricular and subcortical white matter hypodensity compatible with chronic microvascular ischemic changes. Vascular: Unremarkable Skull: Intact. Sinuses/Orbits: Opacification of the left maxillary sinus Remainder of the paranasal sinuses are well aerated. Orbits are unremarkable. Other: None. CT CERVICAL SPINE FINDINGS Alignment: Normal anatomic alignment. Skull base and vertebrae:  Intact. Soft tissues and spinal canal: No prevertebral fluid or swelling. No visible canal hematoma. Disc levels: Degenerative disc disease most pronounced C6-7. No evidence for acute fracture. Upper chest: Emphysematous changes. Other: There are 2 mildly prominent lower left cervical lymph nodes (image 66; series 8) measuring up to 10 mm, nonspecific however potentially reactive. IMPRESSION: No acute intracranial process. Atrophy and chronic microvascular ischemic changes. No acute cervical spine fracture. Electronically Signed   By: Lovey Newcomer M.D.   On: 12/21/2018 13:07   Ct Cervical Spine Wo Contrast  Result Date: 12/21/2018 CLINICAL DATA:  Patient status post fall. EXAM: CT HEAD WITHOUT CONTRAST CT CERVICAL SPINE WITHOUT CONTRAST TECHNIQUE: Multidetector CT imaging of the head and cervical spine was performed following the standard protocol without intravenous contrast. Multiplanar CT image reconstructions of the cervical spine were also generated. COMPARISON:  Brain CT October 03, 2014 FINDINGS: CT HEAD FINDINGS Brain: Ventricles and sulci are appropriate for patient's age. No evidence for acute cortically based infarct, intracranial hemorrhage, mass lesion or mass-effect. Unchanged remote infarct within the left centrum semiovale. Periventricular and subcortical white matter hypodensity compatible with chronic microvascular ischemic changes. Vascular: Unremarkable Skull: Intact. Sinuses/Orbits: Opacification of the left maxillary sinus Remainder of the paranasal sinuses are well aerated. Orbits are unremarkable. Other: None. CT CERVICAL SPINE FINDINGS Alignment: Normal anatomic alignment. Skull base and vertebrae: Intact. Soft tissues and spinal canal: No prevertebral fluid or swelling. No visible canal hematoma. Disc levels: Degenerative disc disease  most pronounced C6-7. No evidence for acute fracture. Upper chest: Emphysematous changes. Other: There are 2 mildly prominent lower left cervical lymph nodes  (image 66; series 8) measuring up to 10 mm, nonspecific however potentially reactive. IMPRESSION: No acute intracranial process. Atrophy and chronic microvascular ischemic changes. No acute cervical spine fracture. Electronically Signed   By: Lovey Newcomer M.D.   On: 12/21/2018 13:07   US Arterial Abi (screening Lower Extremity)  Result Date: 12/21/2018 CLINICAL DATA:  Left BKA 2016.  Right lower extremity pain. EXAM: NONINVASIVE PHYSIOLOGIC VASCULAR STUDY OF BILATERAL LOWER EXTREMITIES TECHNIQUE: Evaluation of both lower extremities were performed at rest, including calculation of ankle-brachial indices with single level Doppler, pressure and pulse volume recording. COMPARISON:  02/11/2015 FINDINGS: Right ABI:  0.48 (previously 0.57 by report) Left ABI:  Non calculable Right Lower Extremity: Weak monophasic arterial waveforms at the distal calf Left Lower Extremity:  Prior BKA IMPRESSION: Severe progressive right lower extremity arterial occlusive disease at rest. Electronically Signed   By: Lucrezia Europe M.D.   On: 12/21/2018 14:14    EKG: Independently reviewed.  Sinus rhythm, first-degree block,  Assessment/Plan Principal Problem:   Foot pain Active Problems:   Hyperlipidemia   CAD- non obstructive disease by cath 3/14   Bladder cancer (HCC)   Chronic combined systolic and diastolic heart failure (HCC)   NICM (nonischemic cardiomyopathy)- EF 20-25% by Echo 04/12/13   BPH (benign prostatic hyperplasia)   NSTEMI (non-ST elevated myocardial infarction) ([place])   PAD (peripheral artery disease) (HCC)   Essential hypertension   Chronic kidney disease (CKD) stage G3a/A1, moderately decreased glomerular filtration rate (GFR) between 45-59 mL/min/1.73 square meter and albuminuria creatinine ratio less than 30 mg/g (HCC)   Paroxysmal atrial fibrillation (HCC)   Alzheimer disease (HCC)   Cellulitis   Principal Problem Right foot pain, multifactorial due to cellulitis with sepsis physiology and  PAD -Patient will be started on broad-spectrum antibiotics with vancomycin and ceftriaxone, will be transferred to [facility] for vascular surgery evaluation given ABI results.  EDP discussed over the phone with Dr. Carlis Abbott.  Please call vascular surgery when patient [facility] at [facility] -Has a white count of 15, is tachypneic and febrile -Start heparin  Active Problems Elevated troponin/NSTEMI in the setting of known coronary artery disease -Significant elevation of troponin 1300 range,  Chronic kidney disease stage III -Baseline creatinine between 1.2 and 1.5, currently at 1.7.  Given slightly elevated creatinine compared to his baseline along with slightly elevated lactic acid will give gentle fluids at 75 cc/h for 5 hours  COPD -Recently admitted for COPD exacerbation, currently has no wheezing, stable, continue home medications  Paroxysmal A. Fib -currently in sinus, hold metoprolol until evaluated by cardiology given bradycardia, first-degree block as well as sepsis physiology with the cellulitis  Chronic combined systolic and diastolic CHF -Most recent EF 35-40% just the end of August 2020.  Appears euvolemic.  Given fever/cellulitis along with mildly elevated lactic acid and creatinine, will do gentle fluid as above  Hypertension -Hold meds up as above, continue Isordil  History of DVT/PE -On Coumadin at home, currently heparinized  Dementia without behavioral disturbances -Continue Aricept, however patient alert and oriented x4 on my evaluation  Bladder cancer -Outpatient follow-up   DVT prophylaxis: heparin infusion  Code Status: DNR  Family Communication: nephew at bedside and son over the phone Disposition Plan: admit to [facility] Consults called: Cardiology, Vascular surgery     Marzetta Board, MD, PhD [facility]  Contact via  [email or web address]  [facility] Office Info P: [phone] F: [phone]   12/21/2018, 3:30 PM

## 2018-12-21 NOTE — ED Triage Notes (Signed)
Patient brought in by caretaker for fall last night. Reports patient got stuck behind his bed. Patient is on blood thinners and is unknown if he hit his head. Caregiver reports some confusion today. Patient is unable to bear weight on his R lower leg.

## 2018-12-21 NOTE — Consult Note (Signed)
Cardiology Consultation:   Patient ID: JALEAL VANDEBERG MRN: [id number]; DOB: 1921/04/28  Admit date: 12/21/2018 Date of Consult: 12/21/2018  Primary Care Provider: Dettinger, Fransisca Kaufmann, MD Primary Cardiologist: Minus Breeding, MD  Primary Electrophysiologist:  None    Patient Profile:   SYRIS VOSBURG is a 85 y.o. male with a hx of NICM, PAF, PAD who is being seen today for the evaluation of elevated troponin at the request of Dr Laverta Baltimore.  History of Present Illness:   Mr. Chaffins 85 yo male history of NICM (cath 06/2012 patent coronaries) LVEF 35-40%, HTN, LBBB, EtOH abuse, prior DVT, PAF, COPD, PAD, chronic diastolic HF. PVCs. HTN,demenia, urothelical carcinoma.   Admitted 2 weeks ago with COPD exacerbation and pulmonary edema due to acute on chronic systolic HF. Discharge weight 152 lbs after diuresising 4.2 L.   Presents after having a fall at home that was not witnessed. Shortly after the caretaker noted some confusion worst than baseline and brought patient into ER. Also compalints of right leg pain. The patient and family denies any specific cardiopulmonary symptoms.    Cr 1.69 BUN 35 WBC 15 Hgb 14.5 Plt 163 INR 2.1 Lactic acid 1.7  hstrop 1324--> EKG Sinus, 1st degree av block, LBBB, PVCs COVID neg CXR no acute process CT head no acute process ABI right 0.48, left aka 11/2018 echo: [id number], grade I diastlic dysfunction, multiple WMAs, moderate RV dysfunction, PASP 70  Heart Pathway Score:     Past Medical History:  Diagnosis Date  . Arthritis    "all over"  . Atrial fibrillation ([place])   . Benign localized prostatic hyperplasia with lower urinary tract symptoms (LUTS)   . Bradycardia 11/28/2015   Severe-HR in the 20s to 30s following intubation for urological procedure.  Marland Kitchen CAD- non obstructive disease by cath 3/14 cardiologist-  dr hochrein  . Chronic lower back pain   . COPD (chronic obstructive pulmonary disease) ([place])   . DDD (degenerative disc disease)   . Depression   .  Dyspnea    w/ exertion and lying down (raise head)  . ETOH abuse   . GERD (gastroesophageal reflux disease)   . Glaucoma   . Glaucoma, both eyes   . History of bladder cancer urologist-- dr Jeffie Pollock   first dx 2012--  s/p TURBT's  . History of DVT of lower extremity 03/2013   bilateral   . History of pulmonary embolus (PE) 04/2013  . HTN (hypertension)   . Hyperlipidemia   . Hypertension   . LBBB (left bundle Nakiyah Beverley block)    chronic  . Non-ischemic cardiomyopathy ([place])    last echo 01-18-2017, ef 35-40%  . NSVT (nonsustained ventricular tachycardia) ([place])    a. NSVT 06/2012; NSVT also seen during 03/2013 adm. b. Med rx. Not candidate for ICD given adv age.  Marland Kitchen PAD (peripheral artery disease) ([place])   . 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 PVCs (premature ventricular contractions)   . PVD (peripheral vascular disease) (H[facility])    Right ABI .75, Left .78 (2006)  . Syncope 06/2012   a. Felt to be postural syncope related to diuretics 06/2012.  Marland Kitchen Systolic and diastolic CHF, chronic (HCC) cardiologsit-  dr hochrein   a. NICM - patent cors 06/2012, EF 40% at that time. b. 03/2013 eval: EF 20-25%.  . Vision loss, left eye    due to glaucoma  . Wears glasses  Past Surgical History:  Procedure Laterality Date  . AMPUTATION Left 10/16/2014   Procedure: AMPUTATION ABOVE KNEE- LEFT;  Surgeon: Mal Misty, MD;  Location: [place];  Service: Vascular;  Laterality: Left;  . CARDIAC CATHETERIZATION  05-11-2004  DR [facility]   MINIMAL CORANARY PLAQUE/ NORMAL LVF/ EF 55%/  NON-OBSTRUCTIVE LAD 25%  . CARDIAC CATHETERIZATION  06/18/2012   dr hochrein   mild luminal irregularities of coronaries, ef 45-50%  . CARDIOVASCULAR STRESS TEST  10-15-2010   LOW RISK NUCLEAR STUDY/ NO EVIDENCE OF ISCHEMIA/ NORMAL EF  . CATARACT EXTRACTION W/ INTRAOCULAR LENS  IMPLANT, BILATERAL Bilateral   . CYSTOSCOPY  10/07/2011    Procedure: CYSTOSCOPY;  Surgeon: Malka So, MD;  Location: [facility]  Service: Urology;  Laterality: N/A;  . CYSTOSCOPY N/A 10/20/2018   Procedure: CYSTOSCOPY WITH FULGURATION OF PROSTATIC URETHRAL TUMOR;  Surgeon: [name], MD;  Location: [facility];  Service: Urology;  Laterality: N/A;  . CYSTOSCOPY WITH BIOPSY  03/14/2012   Procedure: CYSTOSCOPY WITH BIOPSY;  Surgeon: Malka So, MD;  Location: [facility];  Service: Urology;  Laterality: N/A;  WITH FULGURATION  . CYSTOSCOPY WITH BIOPSY N/A 12/09/2016   Procedure: CYSTOSCOPY WITH BIOPSY AND FULGURATION;  Surgeon: [name], MD;  Location: [facility];  Service: Urology;  Laterality: N/A;  . CYSTOSCOPY WITH BIOPSY N/A 12/20/2017   Procedure: CYSTOSCOPY WITH BIOPSY WITH FULGURATION;  Surgeon: [name], MD;  Location: [facility];  Service: Urology;  Laterality: N/A;  . CYSTOSCOPY WITH BIOPSY N/A 06/09/2018   Procedure: CYSTOSCOPY WITH BIOPSY AND FULGURATION;  Surgeon: [name], MD;  Location: [facility];  Service: Urology;  Laterality: N/A;  . CYSTOSCOPY WITH FULGERATION N/A 01/20/2016   Procedure: CYSTOSCOPY, BIOPSY WITH FULGERATION OF URTHRAL TUMOR;  Surgeon: [name], MD;  Location: [facility];  Service: Urology;  Laterality: N/A;  . CYSTOSCOPY WITH FULGERATION N/A 06/01/2016   Procedure: CYSTOSCOPY WITH FULGERATION urethral tumors and bladder neck;  Surgeon: [name], MD;  Location: [facility];  Service: Urology;  Laterality: N/A;  . SHOULDER SURGERY Left 1970's  . TONSILLECTOMY    . TRANSTHORACIC ECHOCARDIOGRAM  01-18-2017   dr hochrein   moderate LVH, ef 35-40%, diffuse hypokinesis/  trivial AR and MR/  mild TR  . TRANSURETHRAL RESECTION OF BLADDER TUMOR  10/07/2011   Procedure: TRANSURETHRAL RESECTION OF BLADDER TUMOR (TURBT);  Surgeon: Malka So, MD;  Location: [facility];  Service: Urology;  Laterality: N/A;  . TRANSURETHRAL RESECTION OF BLADDER TUMOR N/A 07/10/2015   Procedure: TRANSURETHRAL RESECTION OF BLADDER  TUMOR (TURBT), CYSTOSCOPY ;  Surgeon: [name], MD;  Location[facility];  Service: Urology;  Laterality: N/A;  . TRANSURETHRAL RESECTION OF PROSTATE  03/14/2012   Procedure: TRANSURETHRAL RESECTION OF THE PROSTATE WITH GYRUS INSTRUMENTS;  Surgeon: Malka So, MD;  Location: [facility];  Service: Urology;  Laterality: N/A;     Inpatient Medications: Scheduled Meds:  Continuous Infusions: . cefTRIAXone (ROCEPHIN)  IV    . vancomycin     PRN Meds:   Allergies:    Allergies  Allergen Reactions  . Lipitor [Atorvastatin] Other (See Comments)    Unknown rxn per pt    Social History:   Social History   Socioeconomic History  . Marital status: Married    Spouse name: Not on file  . Number of children: 1  . Years of education: Not on file  . Highest education level: Not on file  Occupational History  .  Occupation: Retired  Scientific laboratory technician  . Financial resource strain: Not on file  . Food insecurity    Worry: Not on file    Inability: Not on file  . Transportation needs    Medical: Not on file    Non-medical: Not on file  Tobacco Use  . Smoking status: Former Smoker    Packs/day: 1.00    Years: 35.00    Pack years: 35.00    Types: Cigarettes    Start date: 04/12/1942    Quit date: 04/13/1975    Years since quitting: 43.7  . Smokeless tobacco: Never Used  Substance and Sexual Activity  . Alcohol use: Yes    Alcohol/week: 7.0 standard drinks    Types: 7 Cans of beer per week  . Drug use: No  . Sexual activity: Never  Lifestyle  . Physical activity    Days per week: Not on file    Minutes per session: Not on file  . Stress: Not on file  Relationships  . Social Herbalist on phone: Not on file    Gets together: Not on file    Attends religious service: Not on file    Active member of club or organization: Not on file    Attends meetings of clubs or organizations: Not on file    Relationship status: Not on file  . Intimate partner violence    Fear of current or  ex partner: Not on file    Emotionally abused: Not on file    Physically abused: Not on file    Forced sexual activity: Not on file  Other Topics Concern  . Not on file  Social History Narrative   Lives in [place] with spouse who is s/p spinal cord injury.  He has one grown son who lives in [place].  He is retired but worked for [employer] for years and lives in [place].    Family History:    Family History  Problem Relation Age of Onset  . Hypertension Mother   . Arthritis Mother   . Lung cancer Sister   . Deep vein thrombosis Father   . Early death Brother      ROS:  Please see the history of present illness.  All other ROS reviewed and negative.     Physical Exam/Data:   Vitals:   12/21/18 1300 12/21/18 1330 12/21/18 1438 12/21/18 1439  BP: 126/80 (!) 144/69 (!) 151/108   Pulse:  66  78  Resp: 18   15  Temp:      TempSrc:      SpO2:  (!) 89%  91%   No intake or output data in the 24 hours ending 12/21/18 1502 Last 3 Weights 12/12/2018 12/11/2018 12/10/2018  Weight (lbs) 151 lb 14.4 oz 156 lb 3.1 oz 156 lb 1.4 oz  Weight (kg) 68.9 kg 70.85 kg 70.8 kg     There is no height or weight on file to calculate BMI.  General:  Well nourished, well developed, in no acute distress HEENT: normal Lymph: no adenopathy Neck: no JVD Endocrine:  No thryomegaly Cardiac:  normal S1, S2; RRR; no murmur  Lungs:  clear to auscultation bilaterally, no wheezing, rhonchi or rales  Abd: soft, nontender, no hepatomegaly  Ext: no edema Musculoskeletal:  No deformities, BUE and BLE strength normal and equal Skin: warm and dry  Neuro:  CNs 2-12 intact, no focal abnormalities noted Psych:  Normal affect     Laboratory Data:  High Sensitivity Troponin:   Recent Labs  Lab 12/10/18 1249 12/10/18 1429 12/21/18 1209  TROPONINIHS 29* 28* 1,324*     Chemistry Recent Labs  Lab 12/20/18 1117 12/21/18 1209  NA 146* 141  K 3.8 3.7  CL 102 100  CO2 29 31  GLUCOSE 75 89  BUN  32 35*  CREATININE 1.43* 1.69*  CALCIUM 9.1 8.9  GFRNONAA 41* 34*  GFRAA 48* 39*  ANIONGAP  --  10    Recent Labs  Lab 12/20/18 1117 12/21/18 1209  PROT 5.8* 6.5  ALBUMIN 3.9 3.6  AST 15 18  ALT 21 20  ALKPHOS 153* 122  BILITOT 0.3 1.2   Hematology Recent Labs  Lab 12/20/18 1117 12/21/18 1209  WBC 7.9 15.0*  RBC 4.99 5.26  HGB 13.9 14.5  HCT 42.3 46.1  MCV 85 87.6  MCH 27.9 27.6  MCHC 32.9 31.5  RDW 14.6 16.0*  PLT 194 163   BNPNo results for input(s): BNP, PROBNP in the last 168 hours.  DDimer No results for input(s): DDIMER in the last 168 hours.   Radiology/Studies:  Dg Chest 1 View  Result Date: 12/21/2018 CLINICAL DATA:  Fever. Fell last night. Smoker. EXAM: CHEST  1 VIEW COMPARISON:  12/10/2018 and chest CT dated 07/04/2018 FINDINGS: Mildly enlarged cardiac silhouette with an interval decrease in size. Decreased prominence of the pulmonary vasculature. Stable mild prominence of the interstitial markings. No pleural fluid. Stable vascular tortuosity to the right of the upper mediastinum. Thoracic spine degenerative changes. IMPRESSION: No acute abnormality. Mild cardiomegaly and mild chronic interstitial lung disease. Electronically Signed   By: Claudie Revering M.D.   On: 12/21/2018 13:09   Dg Ankle Complete Right  Result Date: 12/21/2018 CLINICAL DATA:  Ankle pain after a fall last night. EXAM: RIGHT ANKLE - COMPLETE 3+ VIEW COMPARISON:  None. FINDINGS: There is no evidence of fracture, dislocation, or joint effusion. A plantar calcaneal enthesophyte is noted. Vascular calcifications are seen. Soft tissues are unremarkable. IMPRESSION: No acute osseous injury. Electronically Signed   By: Zerita Boers M.D.   On: 12/21/2018 13:09   Ct Head Wo Contrast  Result Date: 12/21/2018 CLINICAL DATA:  Patient status post fall. EXAM: CT HEAD WITHOUT CONTRAST CT CERVICAL SPINE WITHOUT CONTRAST TECHNIQUE: Multidetector CT imaging of the head and cervical spine was performed  following the standard protocol without intravenous contrast. Multiplanar CT image reconstructions of the cervical spine were also generated. COMPARISON:  Brain CT October 03, 2014 FINDINGS: CT HEAD FINDINGS Brain: Ventricles and sulci are appropriate for patient's age. No evidence for acute cortically based infarct, intracranial hemorrhage, mass lesion or mass-effect. Unchanged remote infarct within the left centrum semiovale. Periventricular and subcortical white matter hypodensity compatible with chronic microvascular ischemic changes. Vascular: Unremarkable Skull: Intact. Sinuses/Orbits: Opacification of the left maxillary sinus Remainder of the paranasal sinuses are well aerated. Orbits are unremarkable. Other: None. CT CERVICAL SPINE FINDINGS Alignment: Normal anatomic alignment. Skull base and vertebrae: Intact. Soft tissues and spinal canal: No prevertebral fluid or swelling. No visible canal hematoma. Disc levels: Degenerative disc disease most pronounced C6-7. No evidence for acute fracture. Upper chest: Emphysematous changes. Other: There are 2 mildly prominent lower left cervical lymph nodes (image 66; series 8) measuring up to 10 mm, nonspecific however potentially reactive. IMPRESSION: No acute intracranial process. Atrophy and chronic microvascular ischemic changes. No acute cervical spine fracture. Electronically Signed   By: Lovey Newcomer M.D.   On: 12/21/2018 13:07   Ct Cervical Spine Wo  Contrast  Result Date: 12/21/2018 CLINICAL DATA:  Patient status post fall. EXAM: CT HEAD WITHOUT CONTRAST CT CERVICAL SPINE WITHOUT CONTRAST TECHNIQUE: Multidetector CT imaging of the head and cervical spine was performed following the standard protocol without intravenous contrast. Multiplanar CT image reconstructions of the cervical spine were also generated. COMPARISON:  Brain CT October 03, 2014 FINDINGS: CT HEAD FINDINGS Brain: Ventricles and sulci are appropriate for patient's age. No evidence for acute  cortically based infarct, intracranial hemorrhage, mass lesion or mass-effect. Unchanged remote infarct within the left centrum semiovale. Periventricular and subcortical white matter hypodensity compatible with chronic microvascular ischemic changes. Vascular: Unremarkable Skull: Intact. Sinuses/Orbits: Opacification of the left maxillary sinus Remainder of the paranasal sinuses are well aerated. Orbits are unremarkable. Other: None. CT CERVICAL SPINE FINDINGS Alignment: Normal anatomic alignment. Skull base and vertebrae: Intact. Soft tissues and spinal canal: No prevertebral fluid or swelling. No visible canal hematoma. Disc levels: Degenerative disc disease most pronounced C6-7. No evidence for acute fracture. Upper chest: Emphysematous changes. Other: There are 2 mildly prominent lower left cervical lymph nodes (image 66; series 8) measuring up to 10 mm, nonspecific however potentially reactive. IMPRESSION: No acute intracranial process. Atrophy and chronic microvascular ischemic changes. No acute cervical spine fracture. Electronically Signed   By: Lovey Newcomer M.D.   On: 12/21/2018 13:07   US Arterial Abi (screening Lower Extremity)  Result Date: 12/21/2018 CLINICAL DATA:  Left BKA 2016.  Right lower extremity pain. EXAM: NONINVASIVE PHYSIOLOGIC VASCULAR STUDY OF BILATERAL LOWER EXTREMITIES TECHNIQUE: Evaluation of both lower extremities were performed at rest, including calculation of ankle-brachial indices with single level Doppler, pressure and pulse volume recording. COMPARISON:  02/11/2015 FINDINGS: Right ABI:  0.48 (previously 0.57 by report) Left ABI:  Non calculable Right Lower Extremity: Weak monophasic arterial waveforms at the distal calf Left Lower Extremity:  Prior BKA IMPRESSION: Severe progressive right lower extremity arterial occlusive disease at rest. Electronically Signed   By: Lucrezia Europe M.D.   On: 12/21/2018 14:14    Assessment and Plan:   1. Elevated troponin - elevated troponin  in the absence of any clinical context. Patient presented with fever, a fall, and leg pains. He and family deny any cardiopulmonary symptoms.  - recent echo stable LVEF 35-40%. Chronic LBBB on EKG - at 95 with advanced comorbidities and also renal dysfunction overall poor cath candidate, particulary in the absence of symptoms or clinical instability. - can follow trop trend. Recent echo would not repeat. Would treat medically with anticoag, ASA,statin. In the absence of significant symptoms or clinical deterioration I would not anticipate ischemic testing.  2. Chronic systolic HF - appears euvolemic, continue home regimen  3. Fever/leukocytosis - workup per primary team  4. Leg pains - history of PAD, prior AKA. ABIs in ER with significant right sided disease, ER has been in touch with vascular team.     For questions or updates, please contact [place] Please consult [email or web address] for contact info under     Signed, Carlyle Dolly, MD  12/21/2018 3:02 PM

## 2018-12-21 NOTE — ED Notes (Signed)
Date and time results received: 12/21/18 1534 (use smartphrase ".now" to insert current time)  Test: Lactic Acid Critical Value: 2.2 Name of Provider Notified: Dr Cruzita Lederer  Orders Received? Or Actions Taken?: NA

## 2018-12-21 NOTE — Progress Notes (Addendum)
ANTICOAGULATION CONSULT NOTE - Initial Consult  Pharmacy Consult for heparin Indication: ACS/STEMI  Allergies  Allergen Reactions  . Lipitor [Atorvastatin] Other (See Comments)    Unknown rxn per pt    Vital Signs: Temp: 100.4 F (38 C) (09/10 1148) Temp Source: Oral (09/10 1148) BP: 151/108 (09/10 1438) Pulse Rate: 78 (09/10 1439)  Labs: Recent Labs    12/20/18 1117 12/21/18 1209  HGB 13.9 14.5  HCT 42.3 46.1  PLT 194 163  LABPROT  --  22.9*  INR  --  2.1*  CREATININE 1.43* 1.69*  TROPONINIHS  --  1,324*    Estimated Creatinine Clearance: 24.4 mL/min (A) (by C-G formula based on SCr of 1.69 mg/dL (H)).   Medical History: Past Medical History:  Diagnosis Date  . Arthritis    "all over"  . Atrial fibrillation ([place])   . Benign localized prostatic hyperplasia with lower urinary tract symptoms (LUTS)   . Bradycardia 11/28/2015   Severe-HR in the 20s to 30s following intubation for urological procedure.  Marland Kitchen CAD- non obstructive disease by cath 3/14 cardiologist-  dr hochrein  . Chronic lower back pain   . COPD (chronic obstructive pulmonary disease) ([place])   . DDD (degenerative disc disease)   . Depression   . Dyspnea    w/ exertion and lying down (raise head)  . ETOH abuse   . GERD (gastroesophageal reflux disease)   . Glaucoma   . Glaucoma, both eyes   . History of bladder cancer urologist-- dr Jeffie Pollock   first dx 2012--  s/p TURBT's  . History of DVT of lower extremity 03/2013   bilateral   . History of pulmonary embolus (PE) 04/2013  . HTN (hypertension)   . Hyperlipidemia   . Hypertension   . LBBB (left bundle branch block)    chronic  . Non-ischemic cardiomyopathy ([place])    last echo 01-18-2017, ef 35-40%  . NSVT (nonsustained ventricular tachycardia) ([place])    a. NSVT 06/2012; NSVT also seen during 03/2013 adm. b. Med rx. Not candidate for ICD given adv age.  Marland Kitchen PAD (peripheral artery disease) ([place])   . 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 PVCs (premature ventricular contractions)   . PVD (peripheral vascular disease) ([facility])    Right ABI .75, Left .78 (2006)  . Syncope 06/2012   a. Felt to be postural syncope related to diuretics 06/2012.  Marland Kitchen Systolic and diastolic CHF, chronic (HCC) cardiologsit-  dr hochrein   a. NICM - patent cors 06/2012, EF 40% at that time. b. 03/2013 eval: EF 20-25%.  . Vision loss, left eye    due to glaucoma  . Wears glasses     Medications:  (Not in a hospital admission)   Assessment: Pharmacy consulted to dose heparin in patient with ACS/STEMI.  Elevated troponin on admission at 1,324.  Patient is on warfarin prior to admission with therapeutic INR at 2.1.  Goal of Therapy:  Heparin level 0.3-0.7 units/ml Monitor platelets by anticoagulation protocol: Yes   Plan:  Hold warfarin and monitor INR. Will order INR for 2100 Initiate heparin once INR is below 2.  Margot Ables, PharmD Clinical Pharmacist 12/21/2018 3:12 PM

## 2018-12-21 NOTE — ED Notes (Signed)
Pt was informed we need urine sample. Urinal at bedside.  

## 2018-12-21 NOTE — ED Notes (Signed)
Date and time results received: 12/21/18 1338 (use smartphrase ".now" to insert current time)  Test: Troponin Critical [id number] Name of Provider Notified: Dr Laverta Baltimore  Orders Received? Or Actions Taken?: NA

## 2018-12-21 NOTE — ED Notes (Signed)
Attempted to call report to unit, no answer.

## 2018-12-22 ENCOUNTER — Encounter: Payer: Self-pay | Admitting: *Deleted

## 2018-12-22 DIAGNOSIS — N183 Chronic kidney disease, stage 3 (moderate): Secondary | ICD-10-CM

## 2018-12-22 DIAGNOSIS — I5042 Chronic combined systolic (congestive) and diastolic (congestive) heart failure: Secondary | ICD-10-CM

## 2018-12-22 DIAGNOSIS — M79604 Pain in right leg: Secondary | ICD-10-CM

## 2018-12-22 DIAGNOSIS — I739 Peripheral vascular disease, unspecified: Secondary | ICD-10-CM

## 2018-12-22 DIAGNOSIS — J449 Chronic obstructive pulmonary disease, unspecified: Secondary | ICD-10-CM

## 2018-12-22 LAB — COMPREHENSIVE METABOLIC PANEL
ALT: 17 U/L (ref 0–44)
AST: 21 U/L (ref 15–41)
Albumin: 2.9 g/dL — ABNORMAL LOW (ref 3.5–5.0)
Alkaline Phosphatase: 107 U/L (ref 38–126)
Anion gap: 8 (ref 5–15)
BUN: 31 mg/dL — ABNORMAL HIGH (ref 8–23)
CO2: 29 mmol/L (ref 22–32)
Calcium: 8.7 mg/dL — ABNORMAL LOW (ref 8.9–10.3)
Chloride: 103 mmol/L (ref 98–111)
Creatinine, Ser: 1.59 mg/dL — ABNORMAL HIGH (ref 0.61–1.24)
GFR calc Af Amer: 42 mL/min — ABNORMAL LOW (ref >60)
GFR calc non Af Amer: 36 mL/min — ABNORMAL LOW (ref >60)
Glucose, Bld: 123 mg/dL — ABNORMAL HIGH (ref 70–99)
Potassium: 3.6 mmol/L (ref 3.5–5.1)
Sodium: 140 mmol/L (ref 135–145)
Total Bilirubin: 1 mg/dL (ref 0.3–1.2)
Total Protein: 5.8 g/dL — ABNORMAL LOW (ref 6.5–8.1)

## 2018-12-22 LAB — CBC
HCT: 42.3 % (ref 39.0–52.0)
Hemoglobin: 13.4 g/dL (ref 13.0–17.0)
MCH: 27.4 pg (ref 26.0–34.0)
MCHC: 31.7 g/dL (ref 30.0–36.0)
MCV: 86.5 fL (ref 80.0–100.0)
Platelets: 139 10*3/uL — ABNORMAL LOW (ref 150–400)
RBC: 4.89 MIL/uL (ref 4.22–5.81)
RDW: 15.7 % — ABNORMAL HIGH (ref 11.5–15.5)
WBC: 10.7 10*3/uL — ABNORMAL HIGH (ref 4.0–10.5)
nRBC: 0 % (ref 0.0–0.2)

## 2018-12-22 LAB — TROPONIN I (HIGH SENSITIVITY): Troponin I (High Sensitivity): 1028 ng/L (ref <18)

## 2018-12-22 LAB — PROTIME-INR
INR: 2 — ABNORMAL HIGH (ref 0.8–1.2)
Prothrombin Time: 22.2 seconds — ABNORMAL HIGH (ref 11.4–15.2)

## 2018-12-22 LAB — LACTIC ACID, PLASMA
Lactic Acid, Venous: 1.3 mmol/L (ref 0.5–1.9)
Lactic Acid, Venous: 1.4 mmol/L (ref 0.5–1.9)

## 2018-12-22 MED ORDER — VANCOMYCIN HCL IN DEXTROSE 1-5 GM/200ML-% IV SOLN
1000.0000 mg | INTRAVENOUS | Status: DC
  Administered 2018-12-23 – 2018-12-26 (×3): 1000 mg via INTRAVENOUS
  Filled 2018-12-22 (×3): qty 200

## 2018-12-22 MED ORDER — WARFARIN SODIUM 5 MG PO TABS
5.0000 mg | ORAL_TABLET | Freq: Once | ORAL | Status: AC
  Administered 2018-12-22: 5 mg via ORAL
  Filled 2018-12-22: qty 1

## 2018-12-22 MED ORDER — WARFARIN - PHARMACIST DOSING INPATIENT: Freq: Every day | Status: DC

## 2018-12-22 MED ORDER — SODIUM CHLORIDE 0.9 % IV SOLN
INTRAVENOUS | Status: DC | PRN
  Administered 2018-12-22 – 2018-12-24 (×2): 250 mL via INTRAVENOUS

## 2018-12-22 MED ORDER — FUROSEMIDE 40 MG PO TABS
40.0000 mg | ORAL_TABLET | Freq: Two times a day (BID) | ORAL | Status: DC
  Administered 2018-12-22 – 2018-12-23 (×2): 40 mg via ORAL
  Filled 2018-12-22 (×2): qty 1

## 2018-12-22 NOTE — Progress Notes (Addendum)
ANTICOAGULATION CONSULT NOTE - Follow up [place] for heparin Indication: ACS/STEMI  Allergies  Allergen Reactions  . Lipitor [Atorvastatin] Other (See Comments)    Unknown rxn per pt    Vital Signs: Temp: 99.1 F (37.3 C) (09/11 0915) Temp Source: Oral (09/11 0915) BP: 134/70 (09/11 0915) Pulse Rate: 64 (09/11 0915)  Labs: Recent Labs    12/20/18 1117 12/21/18 1209 12/21/18 1422 12/22/18 0123 12/22/18 0837  HGB 13.9 14.5  --  13.4  --   HCT 42.3 46.1  --  42.3  --   PLT 194 163  --  139*  --   LABPROT  --  22.9*  --  22.2*  --   INR  --  2.1*  --  2.0*  --   CREATININE 1.43* 1.69*  --  1.59*  --   TROPONINIHS  --  1,324* 1,350*  --  1,028*    Estimated Creatinine Clearance: 25.1 mL/min (A) (by C-G formula based on SCr of 1.59 mg/dL (H)).   Medical History: Past Medical History:  Diagnosis Date  . Arthritis    "all over"  . Atrial fibrillation ([place])   . Benign localized prostatic hyperplasia with lower urinary tract symptoms (LUTS)   . Bradycardia 11/28/2015   Severe-HR in the 20s to 30s following intubation for urological procedure.  Marland Kitchen CAD- non obstructive disease by cath 3/14 cardiologist-  dr hochrein  . Chronic lower back pain   . COPD (chronic obstructive pulmonary disease) ([place])   . DDD (degenerative disc disease)   . Depression   . Dyspnea    w/ exertion and lying down (raise head)  . ETOH abuse   . GERD (gastroesophageal reflux disease)   . Glaucoma   . Glaucoma, both eyes   . History of bladder cancer urologist-- dr Jeffie Pollock   first dx 2012--  s/p TURBT's  . History of DVT of lower extremity 03/2013   bilateral   . History of pulmonary embolus (PE) 04/2013  . HTN (hypertension)   . Hyperlipidemia   . Hypertension   . LBBB (left bundle branch block)    chronic  . Non-ischemic cardiomyopathy ([place])    last echo 01-18-2017, ef 35-40%  . NSVT (nonsustained ventricular tachycardia) ([place])    a. NSVT 06/2012; NSVT also seen during  03/2013 adm. b. Med rx. Not candidate for ICD given adv age.  Marland Kitchen PAD (peripheral artery disease) ([place])   . 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 PVCs (premature ventricular contractions)   . PVD (peripheral vascular disease) ([facility])    Right ABI .75, Left .78 (2006)  . Syncope 06/2012   a. Felt to be postural syncope related to diuretics 06/2012.  Marland Kitchen Systolic and diastolic CHF, chronic ([facility]) cardiologsit-  dr hochrein   a. NICM - patent cors 06/2012, EF 40% at that time. b. 03/2013 eval: EF 20-25%.  . Vision loss, left eye    due to glaucoma  . Wears glasses     Medications:  Medications Prior to Admission  Medication Sig Dispense Refill Last Dose  . albuterol (PROVENTIL) (2.5 MG/3ML) 0.083% nebulizer solution NEBULIZE 1 VIAL EVERY 6 HOURS AS NEEDED FOR WHEEZING OR SHORTNESS OF BREATH (Patient taking differently: Take 2.5 mg by nebulization every 6 (six) hours as needed for wheezing or shortness of breath. ) 180 mL 3 unknown  . aspirin EC 81 MG EC tablet Take 1  tablet (81 mg total) by mouth daily.   12/20/2018 at Unknown time  . donepezil (ARICEPT) 10 MG tablet TAKE ONE TABLET AT BEDTIME (Patient taking differently: Take 10 mg by mouth at bedtime. ) 30 tablet 2 12/20/2018 at Unknown time  . febuxostat (ULORIC) 40 MG tablet TAKE 1 TABLET DAILY (Patient taking differently: Take 40 mg by mouth daily. ) 30 tablet 1 UNKNOWN  . fluticasone (FLONASE) 50 MCG/ACT nasal spray Place 1 spray into both nostrils daily as needed for allergies or rhinitis. (Patient taking differently: Place 1 spray into both nostrils at bedtime. ) 16 g 2 12/20/2018 at Unknown time  . furosemide (LASIX) 40 MG tablet Take 1 tablet (40 mg total) by mouth 2 (two) times daily. 60 tablet 0 12/20/2018 at Unknown time  . gabapentin (NEURONTIN) 100 MG capsule TAKE 2 CAPSULES TWICE A DAY (Patient taking differently: Take 200 mg by mouth 2 (two) times  daily. ) 120 capsule 2 12/20/2018 at Unknown time  . isosorbide dinitrate (ISORDIL) 30 MG tablet Take 1 tablet (30 mg total) by mouth 2 (two) times daily. 50 tablet 1 12/20/2018 at Unknown time  . metoprolol succinate (TOPROL-XL) 50 MG 24 hr tablet Take 1 tablet (50 mg total) by mouth every morning. (Appt w/ PCP for blood pressure) 90 tablet 3 12/20/2018 at 1000  . MYRBETRIQ 25 MG TB24 tablet Take 25 mg by mouth every morning.    12/20/2018 at Unknown time  . pantoprazole (PROTONIX) 40 MG tablet TAKE 1 TABLET DAILY (Patient taking differently: Take 40 mg by mouth daily. ) 30 tablet 0 12/20/2018 at Unknown time  . sertraline (ZOLOFT) 50 MG tablet TAKE 1 TABLET IN THE MORNING (Patient taking differently: Take 50 mg by mouth daily. ) 30 tablet 0 12/20/2018 at Unknown time  . simvastatin (ZOCOR) 10 MG tablet Take 1 tablet (10 mg total) by mouth at bedtime. 90 tablet 1 12/20/2018 at Unknown time  . warfarin (COUMADIN) 2.5 MG tablet Take 1-2 tablets (2.5-5 mg total) by mouth See admin instructions. Take 2.5 mg by mouth daily except Mondays and Fridays take 5 mg 40 tablet 3 12/20/2018 at 1000  . ipratropium (ATROVENT) 0.02 % nebulizer solution Take 0.5 mg by nebulization every 6 (six) hours as needed for wheezing or shortness of breath.   unknown  . PROAIR HFA 108 (90 Base) MCG/ACT inhaler 2 PUFFS EVERY 6 HOURS AS NEEDED FOR WHEEZING OR SHORTNESS OF BREATH (Patient taking differently: Inhale 2 puffs into the lungs every 6 (six) hours as needed for wheezing or shortness of breath. ) 8.5 g 4 unknown    Assessment: Pharmacy consulted to dose heparin in patient with ACS/STEMI.  Elevated troponin on admission at 1,324.  Patient is on warfarin prior to admission with therapeutic INR at 2.1  INR remains therapeutic at 2.0, plan is for medical treatment at this time  Goal of Therapy:  Heparin level 0.3-0.7 units/ml Monitor platelets by anticoagulation protocol: Yes   Plan:  Initiate heparin gtt when INR <2.0 INR with AM  labs  Addendum: Given medical management plan will continue his warfarin here PTA dosing: 2.5mg  daily, except mon/fri take 5mg   Plan: Give warfarin 5mg  PO x1 tonight Daily INR, s/s bleeding  Bertis Ruddy, PharmD Clinical Pharmacist Please check AMION for all [place] numbers 12/22/2018 10:49 AM

## 2018-12-22 NOTE — Plan of Care (Signed)

## 2018-12-22 NOTE — Progress Notes (Signed)
Pt's son is at bedside Denice Bors), MD aware. Questions and concerns answered regarding plan of care. Dr. Jamie Kato (son's wife) requested to come and see patient, information regarding hospital visitor policy given and she verbalized understanding, however, continues to request to come and visit. Camera operator, bedside nurse, and director aware.

## 2018-12-22 NOTE — Progress Notes (Signed)
CRITICAL VALUE ALERT  Critical Value:  Tropinin 1028  Date & Time Notied: 12/22/2018 [id number]  Provider Notified: Waldron Labs, MD

## 2018-12-22 NOTE — Progress Notes (Addendum)
PROGRESS NOTE                                                                                                                                                                                                             Patient Demographics:    Scott [name], is a 85 y.o. male, DOB - Nov 22, 1921, [id number]  Admit date - 12/21/2018   Admitting Physician Costin Karlyne Greenspan, MD  Outpatient Primary MD for the patient is Dettinger, Fransisca Kaufmann, MD  LOS - 1   Chief Complaint  Patient presents with   Fall       Brief Narrative    85 y.o. 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 fib, hypertension, chronic kidney disease stage III, COPD, bladder cancer, CAD, left AKA who is being brought to the hospital by family due to mild confusion this morning.  They also report that last night, patient was found down caught in between the bed and the wall.  Patient denies any syncopal episodes and tells me he lost balance and fell.  He has a history of AKA but uses his right foot to pivot.  He denies any chest pain, denies any abdominal pain, nausea or vomiting.  He denies any fever or chills.  He does complain of right foot pain when he touch it but not at rest.  He is alert and oriented x4 however not forthcoming with answering questions.  The nephew, who is the main caregiver is at bedside and contributed to the story.  He was recently admitted to the hospital and discharge 10 days ago due to COPD exacerbation and pulmonary edema.  ED Course: In the emergency room patient is found to have a temp of 100.4, slightly tachypneic, the rest of the vital signs are stable. His blood work shows a creatinine of 1.69, high-sensitivity troponin 1324, lactic acid initially 1.7 on repeat 2.2.  He has a white count of 15.  Covid was negative.  CT head and C-spine are negative.  Ankle x-ray negative. Chest x-ray without acute  abnormalities, mild cardiomegaly and chronic interstitial lung changes.  He had an ABI given poor pulses on the right lower extremity which showed severe progressive arterial occlusive disease at rest.  Dr. Carlis Abbott with vascular surgery and Dr. Harl Bowie with cardiology were consulted.  Patient will be admitted to [facility] for vascular surgery to be able to consult on him.    Subjective:    Scott [name] today has, No headache, No chest pain, No abdominal pain , ports some right lower extremity pain.   Assessment  & Plan :    Principal Problem:   Foot pain Active Problems:   Hyperlipidemia   CAD- non obstructive disease by cath 3/14   Bladder cancer (HCC)   Chronic combined systolic and diastolic heart failure (HCC)   NICM (nonischemic cardiomyopathy)- EF 20-25% by Echo 04/12/13   BPH (benign prostatic hyperplasia)   NSTEMI (non-ST elevated myocardial infarction) ([place])   PAD (peripheral artery disease) (HCC)   Essential hypertension   Chronic kidney disease (CKD) stage G3a/A1, moderately decreased glomerular filtration rate (GFR) between 45-59 mL/min/1.73 square meter and albuminuria creatinine ratio less than 30 mg/g (HCC)   Paroxysmal atrial fibrillation (HCC)   Alzheimer disease (HCC)   Cellulitis  Elevated troponin/NSTEMI in the setting of known coronary artery disease - significant increase from baseline (28>>29 last month), now 1,324>>1.350>>1,028, finding concerning for NSTEMI -Cardiology input greatly appreciated, given his age, multiple comorbidities, CKD, and overall poor health, he is currently a poor cath candidate, particularly in the absence of chest pain. -Continue with aspirin, statin, INR is 2 today, no indication for heparin with therapeutic INR.  Right foot pain/PVD with cellulitis. -Patient currently on broad-spectrum antibiotics with vancomycin and ceftriaxone. -ABIs significant for severe progressive right lower extremity occlusive disease, vascular surgery consulted  regarding recommendations  Chronic kidney disease stage III -Baseline creatinine between 1.2 and 1.5, continue to monitor closely, and avoid nephrotoxic medications.  COPD -Recently admitted for COPD exacerbation, currently has no wheezing, stable, continue home medications.  Paroxysmal A. Fib -currently in sinus,  -on warfarin, pharmacy to dose   Chronic combined systolic and diastolic CHF -Most recent EF 35-40% just the end of August 2020.  Appears euvolemic.  Given fever/cellulitis along with mildly elevated lactic acid and creatinine.  Hypertension -Hold meds up as above, continue Isordil  History of DVT/PE -On Coumadin at home  Dementia without behavioral disturbances -Continue Aricept, however patient alert and oriented x4 on my evaluation  Bladder cancer -Outpatient follow-up   Code Status : DNR  Family Communication  : Discussed with son via phone  Disposition Plan  : Home once stable  Barriers For Discharge : Need further evaluation for peripheral vascular disease, NSTEMI, as well remains on IV antibiotics.  Consults  : Cardiology, vascular surgery  Procedures  : None  DVT Prophylaxis  : Warfarin  Lab Results  Component Value Date   PLT 139 (L) 12/22/2018    Antibiotics  :    Anti-infectives (From admission, onward)   Start     Dose/Rate Route Frequency Ordered Stop   12/23/18 0500  vancomycin (VANCOCIN) IVPB 1000 mg/200 mL premix     1,000 mg 200 mL/hr over 60 Minutes Intravenous Every 36 hours 12/22/18 1109     12/22/18 1600  vancomycin (VANCOCIN) 500 mg in sodium chloride 0.9 % 100 mL IVPB  Status:  Discontinued     500 mg 100 mL/hr over 60 Minutes Intravenous Every 24 hours 12/21/18 1518 12/22/18 1109   12/22/18 1500  cefTRIAXone (ROCEPHIN) 1 g in sodium chloride 0.9 % 100 mL IVPB     1 g 200 mL/hr over 30 Minutes Intravenous Every 24 hours 12/21/18 2139     12/21/18 1630  vancomycin (VANCOCIN) IVPB 750 mg/150 ml premix  750 mg 150  mL/hr over 60 Minutes Intravenous  Once 12/21/18 1515 12/21/18 1823   12/21/18 1530  vancomycin (VANCOCIN) IVPB 750 mg/150 ml premix     750 mg 150 mL/hr over 60 Minutes Intravenous  Once 12/21/18 1515 12/21/18 1720   12/21/18 1500  vancomycin (VANCOCIN) IVPB 1000 mg/200 mL premix  Status:  Discontinued     1,000 mg 200 mL/hr over 60 Minutes Intravenous  Once 12/21/18 1446 12/21/18 1515   12/21/18 1500  cefTRIAXone (ROCEPHIN) 2 g in sodium chloride 0.9 % 100 mL IVPB     2 g 200 mL/hr over 30 Minutes Intravenous  Once 12/21/18 1446 12/21/18 1550        Objective:   Vitals:   12/22/18 0505 12/22/18 0745 12/22/18 0915 12/22/18 1127  BP: (!) 145/73 136/80 134/70 (!) 145/77  Pulse: (!) 59 63 64 75  Resp: 18 (!) 22 20 (!) 22  Temp: 98.5 F (36.9 C) 98.4 F (36.9 C) 99.1 F (37.3 C) 99.6 F (37.6 C)  TempSrc: Oral Oral Oral Oral  SpO2: 96% 96% 100% 97%  Weight:      Height:        Wt Readings from Last 3 Encounters:  12/22/18 72 kg  12/12/18 68.9 kg  10/20/18 72.5 kg     Intake/Output Summary (Last 24 hours) at 12/22/2018 1404 Last data filed at 12/22/2018 0854 Gross per 24 hour  Intake 407.53 ml  Output 800 ml  Net -392.47 ml     Physical Exam  Awake Alert, Oriented X 3, No new F.N deficits, Normal affect Symmetrical Chest wall movement, Good air movement bilaterally, CTAB RRR,No Gallops,Rubs or new Murmurs, No Parasternal Heave +ve B.Sounds, Abd Soft, No tenderness, No rebound - guarding or rigidity. No Cyanosis, Clubbing or edema, in right lower extremity, left AKA.   Data Review:    CBC Recent Labs  Lab 12/20/18 1117 12/21/18 1209 12/22/18 0123  WBC 7.9 15.0* 10.7*  HGB 13.9 14.5 13.4  HCT 42.3 46.1 42.3  PLT 194 163 139*  MCV 85 87.6 86.5  MCH 27.9 27.6 27.4  MCHC 32.9 31.5 31.7  RDW 14.6 16.0* 15.7*  LYMPHSABS 1.7 1.0  --   MONOABS  --  0.8  --   EOSABS 0.1 0.0  --   BASOSABS 0.0 0.0  --     Chemistries  Recent Labs  Lab 12/20/18 1117  12/21/18 1209 12/22/18 0123  NA 146* 141 140  K 3.8 3.7 3.6  CL 102 100 103  CO2 29 31 29   GLUCOSE 75 89 123*  BUN 32 35* 31*  CREATININE 1.43* 1.69* 1.59*  CALCIUM 9.1 8.9 8.7*  AST 15 18 21   ALT 21 20 17   ALKPHOS 153* 122 107  BILITOT 0.3 1.2 1.0   ------------------------------------------------------------------------------------------------------------------ No results for input(s): CHOL, HDL, LDLCALC, TRIG, CHOLHDL, LDLDIRECT in the last 72 hours.  Lab Results  Component Value Date   HGBA1C 5.8 (H) 11/28/2015   ------------------------------------------------------------------------------------------------------------------ No results for input(s): TSH, T4TOTAL, T3FREE, THYROIDAB in the last 72 hours.  Invalid input(s): FREET3 ------------------------------------------------------------------------------------------------------------------ No results for input(s): VITAMINB12, FOLATE, FERRITIN, TIBC, IRON, RETICCTPCT in the last 72 hours.  Coagulation profile Recent Labs  Lab 12/21/18 1209 12/22/18 0123  INR 2.1* 2.0*    No results for input(s): DDIMER in the last 72 hours.  Cardiac Enzymes No results for input(s): CKMB, TROPONINI, MYOGLOBIN in the last 168 hours.  Invalid input(s): CK ------------------------------------------------------------------------------------------------------------------    Component Value Date/Time  BNP 1,713.0 (H) 12/10/2018 1251    Inpatient Medications  Scheduled Meds:  aspirin EC  81 mg Oral Daily   donepezil  10 mg Oral QHS   fluticasone  1 spray Each Nare QHS   furosemide  40 mg Oral BID   influenza vaccine adjuvanted  0.5 mL Intramuscular Tomorrow-1000   isosorbide dinitrate  30 mg Oral BID   mirabegron ER  25 mg Oral q morning - 10a   sertraline  50 mg Oral q morning - 10a   simvastatin  10 mg Oral QHS   warfarin  5 mg Oral ONCE-1800   Warfarin - Pharmacist Dosing Inpatient   Does not apply q1800    Continuous Infusions:  cefTRIAXone (ROCEPHIN)  IV     [START ON 12/23/2018] vancomycin     PRN Meds:.acetaminophen **OR** acetaminophen, ipratropium-albuterol, ondansetron **OR** ondansetron (ZOFRAN) IV  Micro Results Recent Results (from the past 240 hour(s))  Culture, blood (routine x 2)     Status: None (Preliminary result)   Collection Time: 12/21/18 12:22 PM   Specimen: BLOOD RIGHT ARM  Result Value Ref Range Status   Specimen Description BLOOD RIGHT ARM DRAWN BY RN  Final   Special Requests   Final    BOTTLES DRAWN AEROBIC AND ANAEROBIC Blood Culture adequate volume   Culture   Final    NO GROWTH < 24 HOURS Performed at [facility], [address], [place], [place] [address]    Report Status PENDING  Incomplete  SARS Coronavirus 2 [facility] order, Performed in [facility] hospital lab) Nasopharyngeal Nasopharyngeal Swab     Status: None   Collection Time: 12/21/18  1:39 PM   Specimen: Nasopharyngeal Swab  Result Value Ref Range Status   SARS Coronavirus 2 NEGATIVE NEGATIVE Final    Comment: (NOTE) If result is NEGATIVE SARS-CoV-2 target nucleic acids are NOT DETECTED. The SARS-CoV-2 RNA is generally detectable in upper and lower  respiratory specimens during the acute phase of infection. The lowest  concentration of SARS-CoV-2 viral copies this assay can detect is 250  copies / mL. A negative result does not preclude SARS-CoV-2 infection  and should not be used as the sole basis for treatment or other  patient management decisions.  A negative result may occur with  improper specimen collection / handling, submission of specimen other  than nasopharyngeal swab, presence of viral mutation(s) within the  areas targeted by this assay, and inadequate number of viral copies  (<250 copies / mL). A negative result must be combined with clinical  observations, patient history, and epidemiological information. If result is POSITIVE SARS-CoV-2 target nucleic acids are  DETECTED. The SARS-CoV-2 RNA is generally detectable in upper and lower  respiratory specimens dur ing the acute phase of infection.  Positive  results are indicative of active infection with SARS-CoV-2.  Clinical  correlation with patient history and other diagnostic information is  necessary to determine patient infection status.  Positive results do  not rule out bacterial infection or co-infection with other viruses. If result is PRESUMPTIVE POSTIVE SARS-CoV-2 nucleic acids MAY BE PRESENT.   A presumptive positive result was obtained on the submitted specimen  and confirmed on repeat testing.  While 2019 novel coronavirus  (SARS-CoV-2) nucleic acids may be present in the submitted sample  additional confirmatory testing may be necessary for epidemiological  and / or clinical management purposes  to differentiate between  SARS-CoV-2 and other Sarbecovirus currently known to infect humans.  If clinically indicated additional testing with  an alternate test  methodology [phone]) is advised. The SARS-CoV-2 RNA is generally  detectable in upper and lower respiratory sp ecimens during the acute  phase of infection. The expected result is Negative. Fact Sheet for Patients:  StrictlyIdeas.no Fact Sheet for Healthcare Providers: BankingDealers.co.za This test is not yet approved or cleared by the [place] FDA and has been authorized for detection and/or diagnosis of SARS-CoV-2 by FDA under an Emergency Use Authorization (EUA).  This EUA will remain in effect (meaning this test can be used) for the duration of the COVID-19 declaration under Section 564(b)(1) of the Act, 21 U.S.C. section 360bbb-3(b)(1), unless the authorization is terminated or revoked sooner. Performed at [facility], [address] [place], [place] [address]   Culture, blood (routine x 2)     Status: None (Preliminary result)   Collection Time: 12/21/18  2:22 PM    Specimen: BLOOD LEFT WRIST  Result Value Ref Range Status   Specimen Description BLOOD LEFT WRIST  Final   Special Requests   Final    BOTTLES DRAWN AEROBIC ONLY Blood Culture results may not be optimal due to an inadequate volume of blood received in culture bottles   Culture   Final    NO GROWTH < 24 HOURS Performed at [facility], [address], [place], [place] [address]    Report Status PENDING  Incomplete    Radiology Reports Dg Chest 1 View  Result Date: 12/21/2018 CLINICAL DATA:  Fever. Fell last night. Smoker. EXAM: CHEST  1 VIEW COMPARISON:  12/10/2018 and chest CT dated 07/04/2018 FINDINGS: Mildly enlarged cardiac silhouette with an interval decrease in size. Decreased prominence of the pulmonary vasculature. Stable mild prominence of the interstitial markings. No pleural fluid. Stable vascular tortuosity to the right of the upper mediastinum. Thoracic spine degenerative changes. IMPRESSION: No acute abnormality. Mild cardiomegaly and mild chronic interstitial lung disease. Electronically Signed   By: Claudie Revering M.D.   On: 12/21/2018 13:09   Dg Ankle Complete Right  Result Date: 12/21/2018 CLINICAL DATA:  Ankle pain after a fall last night. EXAM: RIGHT ANKLE - COMPLETE 3+ VIEW COMPARISON:  None. FINDINGS: There is no evidence of fracture, dislocation, or joint effusion. A plantar calcaneal enthesophyte is noted. Vascular calcifications are seen. Soft tissues are unremarkable. IMPRESSION: No acute osseous injury. Electronically Signed   By: Zerita Boers M.D.   On: 12/21/2018 13:09   Ct Head Wo Contrast  Result Date: 12/21/2018 CLINICAL DATA:  Patient status post fall. EXAM: CT HEAD WITHOUT CONTRAST CT CERVICAL SPINE WITHOUT CONTRAST TECHNIQUE: Multidetector CT imaging of the head and cervical spine was performed following the standard protocol without intravenous contrast. Multiplanar CT image reconstructions of the cervical spine were also generated. COMPARISON:  Brain CT October 03, 2014 FINDINGS: CT HEAD FINDINGS Brain: Ventricles and sulci are appropriate for patient's age. No evidence for acute cortically based infarct, intracranial hemorrhage, mass lesion or mass-effect. Unchanged remote infarct within the left centrum semiovale. Periventricular and subcortical white matter hypodensity compatible with chronic microvascular ischemic changes. Vascular: Unremarkable Skull: Intact. Sinuses/Orbits: Opacification of the left maxillary sinus Remainder of the paranasal sinuses are well aerated. Orbits are unremarkable. Other: None. CT CERVICAL SPINE FINDINGS Alignment: Normal anatomic alignment. Skull base and vertebrae: Intact. Soft tissues and spinal canal: No prevertebral fluid or swelling. No visible canal hematoma. Disc levels: Degenerative disc disease most pronounced C6-7. No evidence for acute fracture. Upper chest: Emphysematous changes. Other: There are 2 mildly prominent lower left cervical lymph  nodes (image 66; series 8) measuring up to 10 mm, nonspecific however potentially reactive. IMPRESSION: No acute intracranial process. Atrophy and chronic microvascular ischemic changes. No acute cervical spine fracture. Electronically Signed   By: Lovey Newcomer M.D.   On: 12/21/2018 13:07   Ct Cervical Spine Wo Contrast  Result Date: 12/21/2018 CLINICAL DATA:  Patient status post fall. EXAM: CT HEAD WITHOUT CONTRAST CT CERVICAL SPINE WITHOUT CONTRAST TECHNIQUE: Multidetector CT imaging of the head and cervical spine was performed following the standard protocol without intravenous contrast. Multiplanar CT image reconstructions of the cervical spine were also generated. COMPARISON:  Brain CT October 03, 2014 FINDINGS: CT HEAD FINDINGS Brain: Ventricles and sulci are appropriate for patient's age. No evidence for acute cortically based infarct, intracranial hemorrhage, mass lesion or mass-effect. Unchanged remote infarct within the left centrum semiovale. Periventricular and subcortical white  matter hypodensity compatible with chronic microvascular ischemic changes. Vascular: Unremarkable Skull: Intact. Sinuses/Orbits: Opacification of the left maxillary sinus Remainder of the paranasal sinuses are well aerated. Orbits are unremarkable. Other: None. CT CERVICAL SPINE FINDINGS Alignment: Normal anatomic alignment. Skull base and vertebrae: Intact. Soft tissues and spinal canal: No prevertebral fluid or swelling. No visible canal hematoma. Disc levels: Degenerative disc disease most pronounced C6-7. No evidence for acute fracture. Upper chest: Emphysematous changes. Other: There are 2 mildly prominent lower left cervical lymph nodes (image 66; series 8) measuring up to 10 mm, nonspecific however potentially reactive. IMPRESSION: No acute intracranial process. Atrophy and chronic microvascular ischemic changes. No acute cervical spine fracture. Electronically Signed   By: Lovey Newcomer M.D.   On: 12/21/2018 13:07   US Arterial Abi (screening Lower Extremity)  Result Date: 12/21/2018 CLINICAL DATA:  Left BKA 2016.  Right lower extremity pain. EXAM: NONINVASIVE PHYSIOLOGIC VASCULAR STUDY OF BILATERAL LOWER EXTREMITIES TECHNIQUE: Evaluation of both lower extremities were performed at rest, including calculation of ankle-brachial indices with single level Doppler, pressure and pulse volume recording. COMPARISON:  02/11/2015 FINDINGS: Right ABI:  0.48 (previously 0.57 by report) Left ABI:  Non calculable Right Lower Extremity: Weak monophasic arterial waveforms at the distal calf Left Lower Extremity:  Prior BKA IMPRESSION: Severe progressive right lower extremity arterial occlusive disease at rest. Electronically Signed   By: Lucrezia Europe M.D.   On: 12/21/2018 14:14   Dg Chest Port 1 View  Result Date: 12/10/2018 CLINICAL DATA:  Shortness of breath. EXAM: PORTABLE CHEST 1 VIEW COMPARISON:  Sep 07, 2018 FINDINGS: Stable cardiomegaly. The cardiomediastinal silhouette is stable. No pneumothorax. No nodules or  masses. No focal infiltrates or overt edema. IMPRESSION: No active disease. Electronically Signed   By: Dorise Bullion III M.D   On: 12/10/2018 13:31      Emeline Gins Talbert Trembath M.D on 12/22/2018 at 2:04 PM  Between 7am to 7pm - Pager - [phone]  After 7pm go to [email or web address] - password [email or web address]  [facility] -  Office  [phone]

## 2018-12-22 NOTE — Progress Notes (Signed)
CCMD notified this nurse that pt had changed to junctional on tele but converts back to SR with BBB on own. Pt is asymptomatic. MD on call paged.

## 2018-12-22 NOTE — Consult Note (Addendum)
Hospital Consult    Reason for Consult:  Right leg pain  Requesting Physician:  Percival Spanish MRN #:  [id number]  History of Present Illness: This is a 85 y.o. male who was transferred down from [facility] today.  His son states that the pt fell on Wednesday night b/w the bed and the wall.  Pt has a hx of left AKA and uses his right foot to pivot.  He uses a wheelchair to get around.  He states he has a prosthesis but is unable to use it.  He tells me that prior to Wednesday, he did not have pain in his leg at rest.  He denies any non healing wounds on his right foot but does have a wound on his right shin that has been present for a couple of months.  He states he hit it with the wheelchair a couple of times.  He states it has not gotten better or worse.  When talking to him today, he tells me he does not have any pain in his right leg.  He does not walk so cannot confirm claudication.  Pt with fever of 100.4 now 99.6 & leukocytosis of 15k but is down to 10.7k today.  It was documented that he had erythema/cellulitis of the RLE and was started on broad spectrum abx.  Given his decreased ABI's, he was transferred to [facility] for evaluation.    Per cardiology consult note, pt had ABI at [facility] and is 0.48 on the right.  It was 0.57 in 2016.    Pt has hx of DVT/PE in the past and is on coumadin.  He is currently on heparin gtt.  He also has hx of PAF.   He has hx of CKD III with baseline Cr of 1.2-1.4 increased to 1.6 this admission.   Pt has hx of COPD and recently was admitted for exacerbation.   He is on Aricept for dementia.  Pt lives alone, but has 24 hr care.  He has hx of Bladder CA.   The pt is not on a statin for cholesterol management. - allergy  The pt is on a daily aspirin.   Other AC:  Coumadin/heparin gtt The pt is currently not on meds for hypertension.   The pt is not diabetic.   Tobacco hx:  Remote-says he quit 50 years ago.   Past Medical History:  Diagnosis Date  . Arthritis    "all over"   . Atrial fibrillation ([place])   . Benign localized prostatic hyperplasia with lower urinary tract symptoms (LUTS)   . Bradycardia 11/28/2015   Severe-HR in the 20s to 30s following intubation for urological procedure.  Marland Kitchen CAD- non obstructive disease by cath 3/14 cardiologist-  dr hochrein  . Chronic lower back pain   . COPD (chronic obstructive pulmonary disease) ([place])   . DDD (degenerative disc disease)   . Depression   . Dyspnea    w/ exertion and lying down (raise head)  . ETOH abuse   . GERD (gastroesophageal reflux disease)   . Glaucoma   . Glaucoma, both eyes   . History of bladder cancer urologist-- dr Jeffie Pollock   first dx 2012--  s/p TURBT's  . History of DVT of lower extremity 03/2013   bilateral   . History of pulmonary embolus (PE) 04/2013  . HTN (hypertension)   . Hyperlipidemia   . Hypertension   . LBBB (left bundle branch block)    chronic  . Non-ischemic cardiomyopathy ([place])  last echo 01-18-2017, ef 35-40%  . NSVT (nonsustained ventricular tachycardia) ([place])    a. NSVT 06/2012; NSVT also seen during 03/2013 adm. b. Med rx. Not candidate for ICD given adv age.  Marland Kitchen PAD (peripheral artery disease) ([place])   . 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 PVCs (premature ventricular contractions)   . PVD (peripheral vascular disease) ([facility])    Right ABI .75, Left .78 (2006)  . Syncope 06/2012   a. Felt to be postural syncope related to diuretics 06/2012.  Marland Kitchen Systolic and diastolic CHF, chronic (HCC) cardiologsit-  dr hochrein   a. NICM - patent cors 06/2012, EF 40% at that time. b. 03/2013 eval: EF 20-25%.  . Vision loss, left eye    due to glaucoma  . Wears glasses     Past Surgical History:  Procedure Laterality Date  . AMPUTATION Left 10/16/2014   Procedure: AMPUTATION ABOVE KNEE- LEFT;  Surgeon: Mal Misty, MD;  Location: [facility];  Service: Vascular;  Laterality: Left;  .  CARDIAC CATHETERIZATION  05-11-2004  DR [facility]   MINIMAL CORANARY PLAQUE/ NORMAL LVF/ EF 55%/  NON-OBSTRUCTIVE LAD 25%  . CARDIAC CATHETERIZATION  06/18/2012   dr hochrein   mild luminal irregularities of coronaries, ef 45-50%  . CARDIOVASCULAR STRESS TEST  10-15-2010   LOW RISK NUCLEAR STUDY/ NO EVIDENCE OF ISCHEMIA/ NORMAL EF  . CATARACT EXTRACTION W/ INTRAOCULAR LENS  IMPLANT, BILATERAL Bilateral   . CYSTOSCOPY  10/07/2011   Procedure: CYSTOSCOPY;  Surgeon: Malka So, MD;  Location: [facility];  Service: Urology;  Laterality: N/A;  . CYSTOSCOPY N/A 10/20/2018   Procedure: CYSTOSCOPY WITH FULGURATION OF PROSTATIC URETHRAL TUMOR;  Surgeon: [name], MD;  Location: [facility];  Service: Urology;  Laterality: N/A;  . CYSTOSCOPY WITH BIOPSY  03/14/2012   Procedure: CYSTOSCOPY WITH BIOPSY;  Surgeon: Malka So, MD;  Location: [facility];  Service: Urology;  Laterality: N/A;  WITH FULGURATION  . CYSTOSCOPY WITH BIOPSY N/A 12/09/2016   Procedure: CYSTOSCOPY WITH BIOPSY AND FULGURATION;  Surgeon: [name], MD;  Location: [facility];  Service: Urology;  Laterality: N/A;  . CYSTOSCOPY WITH BIOPSY N/A 12/20/2017   Procedure: CYSTOSCOPY WITH BIOPSY WITH FULGURATION;  Surgeon: [name], MD;  Location: [facility];  Service: Urology;  Laterality: N/A;  . CYSTOSCOPY WITH BIOPSY N/A 06/09/2018   Procedure: CYSTOSCOPY WITH BIOPSY AND FULGURATION;  Surgeon: [name], MD;  Location: [facility];  Service: Urology;  Laterality: N/A;  . CYSTOSCOPY WITH FULGERATION N/A 01/20/2016   Procedure: CYSTOSCOPY, BIOPSY WITH FULGERATION OF URTHRAL TUMOR;  Surgeon: [name], MD;  [facility];  Service: Urology;  Laterality: N/A;  . CYSTOSCOPY WITH FULGERATION N/A 06/01/2016   Procedure: CYSTOSCOPY WITH FULGERATION urethral tumors and bladder neck;  Surgeon: [name], MD;  Location[facility];  Service: Urology;  Laterality: N/A;  . SHOULDER SURGERY Left 1970's  . TONSILLECTOMY    . TRANSTHORACIC  ECHOCARDIOGRAM  01-18-2017   dr hochrein   moderate LVH, ef 35-40%, diffuse hypokinesis/  trivial AR and MR/  mild TR  . TRANSURETHRAL RESECTION OF BLADDER TUMOR  10/07/2011   Procedure: TRANSURETHRAL RESECTION OF BLADDER TUMOR (TURBT);  Surgeon: Malka So, MD;  Location: [facility];  Service: Urology;  Laterality: N/A;  . TRANSURETHRAL RESECTION OF BLADDER TUMOR N/A 07/10/2015   Procedure: TRANSURETHRAL RESECTION OF BLADDER TUMOR (TURBT), CYSTOSCOPY ;  Surgeon: [name], MD;  [facility];  Service: Urology;  Laterality: N/A;  . TRANSURETHRAL RESECTION OF PROSTATE  03/14/2012   Procedure: TRANSURETHRAL RESECTION OF THE PROSTATE WITH GYRUS INSTRUMENTS;  Surgeon: Malka So, MD;  Location[facility];  Service: Urology;  Laterality: N/A;    Allergies  Allergen Reactions  . Lipitor [Atorvastatin] Other (See Comments)    Unknown rxn per pt    Prior to Admission medications   Medication Sig Start Date End Date Taking? Authorizing Provider  albuterol (PROVENTIL) (2.5 MG/3ML) 0.083% nebulizer solution NEBULIZE 1 VIAL EVERY 6 HOURS AS NEEDED FOR WHEEZING OR SHORTNESS OF BREATH Patient taking differently: Take 2.5 mg by nebulization every 6 (six) hours as needed for wheezing or shortness of breath.  09/07/18  Yes Dettinger, Fransisca Kaufmann, MD  aspirin EC 81 MG EC tablet Take 1 tablet (81 mg total) by mouth daily. 12/12/18  Yes Tat, [name], MD  donepezil (ARICEPT) 10 MG tablet TAKE ONE TABLET AT BEDTIME Patient taking differently: Take 10 mg by mouth at bedtime.  11/10/18  Yes Dettinger, Fransisca Kaufmann, MD  febuxostat (ULORIC) 40 MG tablet TAKE 1 TABLET DAILY Patient taking differently: Take 40 mg by mouth daily.  11/10/18  Yes Dettinger, Fransisca Kaufmann, MD  fluticasone (FLONASE) 50 MCG/ACT nasal spray Place 1 spray into both nostrils daily as needed for allergies or rhinitis. Patient taking differently: Place 1 spray into both nostrils at bedtime.  09/07/18  Yes Dettinger, Fransisca Kaufmann, MD  furosemide  (LASIX) 40 MG tablet Take 1 tablet (40 mg total) by mouth 2 (two) times daily. 12/05/18  Yes Dettinger, Fransisca Kaufmann, MD  gabapentin (NEURONTIN) 100 MG capsule TAKE 2 CAPSULES TWICE A DAY Patient taking differently: Take 200 mg by mouth 2 (two) times daily.  11/10/18  Yes Dettinger, Fransisca Kaufmann, MD  isosorbide dinitrate (ISORDIL) 30 MG tablet Take 1 tablet (30 mg total) by mouth 2 (two) times daily. 12/12/18  Yes Tat, [name], MD  metoprolol succinate (TOPROL-XL) 50 MG 24 hr tablet Take 1 tablet (50 mg total) by mouth every morning. (Appt w/ PCP for blood pressure) 08/02/18  Yes Dettinger, Fransisca Kaufmann, MD  MYRBETRIQ 25 MG TB24 tablet Take 25 mg by mouth every morning.  09/20/16  Yes [provider]  pantoprazole (PROTONIX) 40 MG tablet TAKE 1 TABLET DAILY Patient taking differently: Take 40 mg by mouth daily.  11/23/18  Yes Hawks, [name] A, FNP  sertraline (ZOLOFT) 50 MG tablet TAKE 1 TABLET IN THE MORNING Patient taking differently: Take 50 mg by mouth daily.  11/23/18  Yes Dettinger, Fransisca Kaufmann, MD  simvastatin (ZOCOR) 10 MG tablet Take 1 tablet (10 mg total) by mouth at bedtime. 11/30/18  Yes Dettinger, Fransisca Kaufmann, MD  warfarin (COUMADIN) 2.5 MG tablet Take 1-2 tablets (2.5-5 mg total) by mouth See admin instructions. Take 2.5 mg by mouth daily except Mondays and Fridays take 5 mg 10/11/18  Yes Dettinger, Fransisca Kaufmann, MD  folic acid (FOLVITE) 1 MG tablet TAKE 1 TABLET DAILY 12/22/18   Dettinger, Fransisca Kaufmann, MD  ipratropium (ATROVENT) 0.02 % nebulizer solution Take 0.5 mg by nebulization every 6 (six) hours as needed for wheezing or shortness of breath.    [provider]  PROAIR HFA 108 (90 Base) MCG/ACT inhaler 2 PUFFS EVERY 6 HOURS AS NEEDED FOR WHEEZING OR SHORTNESS OF BREATH Patient taking differently: Inhale 2 puffs into the lungs every 6 (six) hours as needed for wheezing or shortness of breath.  05/02/18  Dettinger, Fransisca Kaufmann, MD    Social History   Socioeconomic History  . Marital status: Married     Spouse name: Not on file  . Number of children: 1  . Years of education: Not on file  . Highest education level: Not on file  Occupational History  . Occupation: Retired  Scientific laboratory technician  . Financial resource strain: Not on file  . Food insecurity    Worry: Not on file    Inability: Not on file  . Transportation needs    Medical: Not on file    Non-medical: Not on file  Tobacco Use  . Smoking status: Former Smoker    Packs/day: 1.00    Years: 35.00    Pack years: 35.00    Types: Cigarettes    Start date: 04/12/1942    Quit date: 04/13/1975    Years since quitting: 43.7  . Smokeless tobacco: Never Used  Substance and Sexual Activity  . Alcohol use: Yes    Alcohol/week: 7.0 standard drinks    Types: 7 Cans of beer per week  . Drug use: No  . Sexual activity: Never  Lifestyle  . Physical activity    Days per week: Not on file    Minutes per session: Not on file  . Stress: Not on file  Relationships  . Social Herbalist on phone: Not on file    Gets together: Not on file    Attends religious service: Not on file    Active member of club or organization: Not on file    Attends meetings of clubs or organizations: Not on file    Relationship status: Not on file  . Intimate partner violence    Fear of current or ex partner: Not on file    Emotionally abused: Not on file    Physically abused: Not on file    Forced sexual activity: Not on file  Other Topics Concern  . Not on file  Social History Narrative   Lives in [place] with spouse who is s/p spinal cord injury.  He has one grown son who lives in [place].  He is retired but worked for [employer] for years and lives in [place].     Family History  Problem Relation Age of Onset  . Hypertension Mother   . Arthritis Mother   . Lung cancer Sister   . Deep vein thrombosis Father   . Early death Brother     ROS: [x]  Positive   [ ]  Negative   [ ]  All sytems reviewed and are negative  Cardiac: [x]   CHF/cardiomyopathy EF 35-40% [x]  afib   Vascular: [x]  pain in right leg after fall []  pain in legs at rest-see HPI [x]  non-healing ulcer right shin [x]  hx of DVT [x]  swelling in right leg  Pulmonary: [x]  COPD  Neurologic: []  hx of CVA  Hematologic: [x]  hx of cancer  Endocrine:   []  diabetes []  thyroid disease  GI [x]  GERD   GU: [x]  CKD/renal failure []  HD--[]  M/W/F or []  T/T/S  Psychiatric: [x]  depression  Musculoskeletal: [x]  DDD  Integumentary: []  rashes []  ulcers  Constitutional: [x]  fever   Physical Examination  Vitals:   12/22/18 0915 12/22/18 1127  BP: 134/70 (!) 145/77  Pulse: 64 75  Resp: 20 (!) 22  Temp: 99.1 F (37.3 C) 99.6 F (37.6 C)  SpO2: 100% 97%   Body mass index is 25.63 kg/m.  General:  WDWN in NAD Gait: Not  observed HENT: WNL, normocephalic Pulmonary: normal non-labored breathing, without Rales, rhonchi,  wheezing Cardiac: regular, without  Murmurs, rubs or gallops; without carotid bruits Abdomen:  soft, NT/ND, no masses Skin: without rashes Vascular Exam/Pulses: Bilateral femoral pulses are palpable Right PT and peroneal are monophasic > right DP monophasic Extremities: without ischemic changes, without Gangrene , without cellulitis; without open wounds on the right.  Motor/sensory in tact right foot.  Left AKA site looks good.   Right shin  Musculoskeletal: no muscle wasting or atrophy  Neurologic: A&O X 3;  No focal weakness or paresthesias are detected; speech is fluent/normal Psychiatric:  The pt has Normal affect.   CBC    Component Value Date/Time   WBC 10.7 (H) 12/22/2018 0123   RBC 4.89 12/22/2018 0123   HGB 13.4 12/22/2018 0123   HGB 13.9 12/20/2018 1117   HGB 11.5 (L) 01/13/2011 1349   HCT 42.3 12/22/2018 0123   HCT 42.3 12/20/2018 1117   HCT 33.7 (L) 01/13/2011 1349   PLT 139 (L) 12/22/2018 0123   PLT 194 12/20/2018 1117   MCV 86.5 12/22/2018 0123   MCV 85 12/20/2018 1117   MCV 94.3 01/13/2011  1349   MCH 27.4 12/22/2018 0123   MCHC 31.7 12/22/2018 0123   RDW 15.7 (H) 12/22/2018 0123   RDW 14.6 12/20/2018 1117   RDW 17.4 (H) 01/13/2011 1349   LYMPHSABS 1.0 12/21/2018 1209   LYMPHSABS 1.7 12/20/2018 1117   LYMPHSABS 0.5 (L) 01/13/2011 1349   MONOABS 0.8 12/21/2018 1209   MONOABS 0.4 01/13/2011 1349   EOSABS 0.0 12/21/2018 1209   EOSABS 0.1 12/20/2018 1117   BASOSABS 0.0 12/21/2018 1209   BASOSABS 0.0 12/20/2018 1117   BASOSABS 0.0 01/13/2011 1349    BMET    Component Value Date/Time   NA 140 12/22/2018 0123   NA 146 (H) 12/20/2018 1117   K 3.6 12/22/2018 0123   CL 103 12/22/2018 0123   CO2 29 12/22/2018 0123   GLUCOSE 123 (H) 12/22/2018 0123   BUN 31 (H) 12/22/2018 0123   BUN 32 12/20/2018 1117   CREATININE 1.59 (H) 12/22/2018 0123   CREATININE 0.91 11/02/2012 1310   CALCIUM 8.7 (L) 12/22/2018 0123   GFRNONAA 36 (L) 12/22/2018 0123   GFRNONAA 74 11/02/2012 1310   GFRAA 42 (L) 12/22/2018 0123   GFRAA 86 11/02/2012 1310    COAGS: Lab Results  Component Value Date   INR 2.0 (H) 12/22/2018   INR 2.1 (H) 12/21/2018   INR 2.3 (H) 12/12/2018     Non-Invasive Vascular Imaging:   Right ABI reported to be 0.48 at [facility] ER yesterday (down from 0.57 in 2016)   ASSESSMENT/PLAN: This is a 85 y.o. male pt with recent right leg pain after fall  -pt reports he did not have any pain in his right leg prior to his fall on Wednesday night confirmed by his son.  He does not have any non healing wounds on his feet, but does have a superficial wound on his shin that has been present a couple of months.  This does not appear to be infectious.   -pt denies pain in his right leg at this time.  He does not walk, so unable to determine if he has claudication but does not have rest pain.  He states he did not have pain in the right leg until he fell Wednesday.   -his ABI is down at 0.48 but was 0.57 in 2016.  Without rest pain or wounds on  his foot, most likely would not perform  angiogram.  If he does need angiogram, may need to use CO2 given his renal function.   -Dr. Donzetta Matters to see pt this afternoon.     Leontine Locket, PA-C [facility] [phone]   I have independently interviewed and examined patient and agree with PA assessment and plan above.  Patient not complaining of any pain in right lower extremity at this time leukocytosis has resolved does not appear to have any cellulitis of the right lower extremity.  In the absence of any ongoing symptoms would not recommend angiography particularly in light of hospitalization for other causes.  We can follow him as an outpatient and plan for discussion of angiography at that time.  Khristina Janota C. Donzetta Matters, MD [facility] Office: [phone] Pager: [phone]

## 2018-12-22 NOTE — Progress Notes (Addendum)
Progress Note  Patient Name: Scott [name] Date of Encounter: 12/22/2018  Primary Cardiologist: Minus Breeding, MD   Subjective   No CP and no dyspnea. Still has right leg pain but not as severe as yesterday. Overall improved.   Inpatient Medications    Scheduled Meds:  aspirin EC  81 mg Oral Daily   donepezil  10 mg Oral QHS   fluticasone  1 spray Each Nare QHS   influenza vaccine adjuvanted  0.5 mL Intramuscular Tomorrow-1000   isosorbide dinitrate  30 mg Oral BID   mirabegron ER  25 mg Oral q morning - 10a   sertraline  50 mg Oral q morning - 10a   simvastatin  10 mg Oral QHS   Continuous Infusions:  cefTRIAXone (ROCEPHIN)  IV     vancomycin     PRN Meds: acetaminophen **OR** acetaminophen, ipratropium-albuterol, ondansetron **OR** ondansetron (ZOFRAN) IV   Vital Signs    Vitals:   12/22/18 0110 12/22/18 0505 12/22/18 0745 12/22/18 0915  BP: 132/80 (!) 145/73 136/80 134/70  Pulse: (!) 58 (!) 59 63 64  Resp: 18 18 (!) 22 20  Temp: (!) 97.5 F (36.4 C) 98.5 F (36.9 C) 98.4 F (36.9 C) 99.1 F (37.3 C)  TempSrc: Oral Oral Oral Oral  SpO2: 98% 96% 96% 100%  Weight: 72 kg     Height:        Intake/Output Summary (Last 24 hours) at 12/22/2018 0932 Last data filed at 12/22/2018 0854 Gross per 24 hour  Intake 407.53 ml  Output 800 ml  Net -392.47 ml   Last 3 Weights 12/22/2018 12/21/2018 12/12/2018  Weight (lbs) 158 lb 12.8 oz 156 lb 12 oz 151 lb 14.4 oz  Weight (kg) 72.031 kg 71.1 kg 68.9 kg      Telemetry    NSR currently, frequent PVCs NSVT on tele- Personally Reviewed  ECG    SR / LBBB 62 bpm and PVCs - Personally Reviewed  Physical Exam   GEN: Pleasant elderly AAM in No acute distress.   Neck: No JVD Cardiac: RRR, no murmurs, rubs, or gallops.  Respiratory: Clear to auscultation bilaterally. GI: Soft, nontender, non-distended  MS: trace ankle edema RLE edema; s/p left AKA Neuro:  Nonfocal  Psych: Normal affect   Labs    High  Sensitivity Troponin:   Recent Labs  Lab 12/10/18 1249 12/10/18 1429 12/21/18 1209 12/21/18 1422 12/22/18 0837  TROPONINIHS 29* 28* 1,324* 1,350* 1,028*      Chemistry Recent Labs  Lab 12/20/18 1117 12/21/18 1209 12/22/18 0123  NA 146* 141 140  K 3.8 3.7 3.6  CL 102 100 103  CO2 29 31 29   GLUCOSE 75 89 123*  BUN 32 35* 31*  CREATININE 1.43* 1.69* 1.59*  CALCIUM 9.1 8.9 8.7*  PROT 5.8* 6.5 5.8*  ALBUMIN 3.9 3.6 2.9*  AST 15 18 21   ALT 21 20 17   ALKPHOS 153* 122 107  BILITOT 0.3 1.2 1.0  GFRNONAA 41* 34* 36*  GFRAA 48* 39* 42*  ANIONGAP  --  10 8     Hematology Recent Labs  Lab 12/20/18 1117 12/21/18 1209 12/22/18 0123  WBC 7.9 15.0* 10.7*  RBC 4.99 5.26 4.89  HGB 13.9 14.5 13.4  HCT 42.3 46.1 42.3  MCV 85 87.6 86.5  MCH 27.9 27.6 27.4  MCHC 32.9 31.5 31.7  RDW 14.6 16.0* 15.7*  PLT 194 163 139*    BNPNo results for input(s): BNP, PROBNP in the last 168 hours.  DDimer No results for input(s): DDIMER in the last 168 hours.   Radiology    Dg Chest 1 View  Result Date: 12/21/2018 CLINICAL DATA:  Fever. Fell last night. Smoker. EXAM: CHEST  1 VIEW COMPARISON:  12/10/2018 and chest CT dated 07/04/2018 FINDINGS: Mildly enlarged cardiac silhouette with an interval decrease in size. Decreased prominence of the pulmonary vasculature. Stable mild prominence of the interstitial markings. No pleural fluid. Stable vascular tortuosity to the right of the upper mediastinum. Thoracic spine degenerative changes. IMPRESSION: No acute abnormality. Mild cardiomegaly and mild chronic interstitial lung disease. Electronically Signed   By: Claudie Revering M.D.   On: 12/21/2018 13:09   Dg Ankle Complete Right  Result Date: 12/21/2018 CLINICAL DATA:  Ankle pain after a fall last night. EXAM: RIGHT ANKLE - COMPLETE 3+ VIEW COMPARISON:  None. FINDINGS: There is no evidence of fracture, dislocation, or joint effusion. A plantar calcaneal enthesophyte is noted. Vascular calcifications  are seen. Soft tissues are unremarkable. IMPRESSION: No acute osseous injury. Electronically Signed   By: Zerita Boers M.D.   On: 12/21/2018 13:09   Ct Head Wo Contrast  Result Date: 12/21/2018 CLINICAL DATA:  Patient status post fall. EXAM: CT HEAD WITHOUT CONTRAST CT CERVICAL SPINE WITHOUT CONTRAST TECHNIQUE: Multidetector CT imaging of the head and cervical spine was performed following the standard protocol without intravenous contrast. Multiplanar CT image reconstructions of the cervical spine were also generated. COMPARISON:  Brain CT October 03, 2014 FINDINGS: CT HEAD FINDINGS Brain: Ventricles and sulci are appropriate for patient's age. No evidence for acute cortically based infarct, intracranial hemorrhage, mass lesion or mass-effect. Unchanged remote infarct within the left centrum semiovale. Periventricular and subcortical white matter hypodensity compatible with chronic microvascular ischemic changes. Vascular: Unremarkable Skull: Intact. Sinuses/Orbits: Opacification of the left maxillary sinus Remainder of the paranasal sinuses are well aerated. Orbits are unremarkable. Other: None. CT CERVICAL SPINE FINDINGS Alignment: Normal anatomic alignment. Skull base and vertebrae: Intact. Soft tissues and spinal canal: No prevertebral fluid or swelling. No visible canal hematoma. Disc levels: Degenerative disc disease most pronounced C6-7. No evidence for acute fracture. Upper chest: Emphysematous changes. Other: There are 2 mildly prominent lower left cervical lymph nodes (image 66; series 8) measuring up to 10 mm, nonspecific however potentially reactive. IMPRESSION: No acute intracranial process. Atrophy and chronic microvascular ischemic changes. No acute cervical spine fracture. Electronically Signed   By: Lovey Newcomer M.D.   On: 12/21/2018 13:07   Ct Cervical Spine Wo Contrast  Result Date: 12/21/2018 CLINICAL DATA:  Patient status post fall. EXAM: CT HEAD WITHOUT CONTRAST CT CERVICAL SPINE WITHOUT  CONTRAST TECHNIQUE: Multidetector CT imaging of the head and cervical spine was performed following the standard protocol without intravenous contrast. Multiplanar CT image reconstructions of the cervical spine were also generated. COMPARISON:  Brain CT October 03, 2014 FINDINGS: CT HEAD FINDINGS Brain: Ventricles and sulci are appropriate for patient's age. No evidence for acute cortically based infarct, intracranial hemorrhage, mass lesion or mass-effect. Unchanged remote infarct within the left centrum semiovale. Periventricular and subcortical white matter hypodensity compatible with chronic microvascular ischemic changes. Vascular: Unremarkable Skull: Intact. Sinuses/Orbits: Opacification of the left maxillary sinus Remainder of the paranasal sinuses are well aerated. Orbits are unremarkable. Other: None. CT CERVICAL SPINE FINDINGS Alignment: Normal anatomic alignment. Skull base and vertebrae: Intact. Soft tissues and spinal canal: No prevertebral fluid or swelling. No visible canal hematoma. Disc levels: Degenerative disc disease most pronounced C6-7. No evidence for acute fracture. Upper chest: Emphysematous changes.  Other: There are 2 mildly prominent lower left cervical lymph nodes (image 66; series 8) measuring up to 10 mm, nonspecific however potentially reactive. IMPRESSION: No acute intracranial process. Atrophy and chronic microvascular ischemic changes. No acute cervical spine fracture. Electronically Signed   By: Lovey Newcomer M.D.   On: 12/21/2018 13:07   US Arterial Abi (screening Lower Extremity)  Result Date: 12/21/2018 CLINICAL DATA:  Left BKA 2016.  Right lower extremity pain. EXAM: NONINVASIVE PHYSIOLOGIC VASCULAR STUDY OF BILATERAL LOWER EXTREMITIES TECHNIQUE: Evaluation of both lower extremities were performed at rest, including calculation of ankle-brachial indices with single level Doppler, pressure and pulse volume recording. COMPARISON:  02/11/2015 FINDINGS: Right ABI:  0.48 (previously  0.57 by report) Left ABI:  Non calculable Right Lower Extremity: Weak monophasic arterial waveforms at the distal calf Left Lower Extremity:  Prior BKA IMPRESSION: Severe progressive right lower extremity arterial occlusive disease at rest. Electronically Signed   By: Lucrezia Europe M.D.   On: 12/21/2018 14:14    Cardiac Studies   2D Echo 12/11/18   1. The left ventricle has moderately reduced systolic function, with an ejection fraction of 35- 40%. The cavity size was normal. There is mildly increased left ventricular wall thickness. Left ventricular diastolic Doppler parameters are consistent with impaired relaxation. Elevated left ventricular end-diastolic pressure. 2. There is akinesis of the left ventricular, apical anteroseptal wall. 3. There is akinesis of the left ventricular, basal-mid inferior wall. 4. The right ventricle has moderately reduced systolic function. The cavity was normal. There is no increase in right ventricular wall thickness. Right ventricular systolic pressure is severely elevated with an estimated pressure of 70.2 mmHg. 5. Left atrial size was mildly dilated. 6. The aortic valve is tricuspid. Moderate calcification of the aortic valve. Moderate aortic annular calcification noted. 7. The mitral valve is degenerative. Mild thickening of the mitral valve leaflet. Moderate calcification of the mitral valve leaflet. There is moderate mitral annular calcification present. 8. The tricuspid valve is grossly normal. 9. The aorta is normal unless otherwise noted.   Patient Profile     Scott [name] is a 85 y.o. male with a hx of NICM, PAF, h/o DVT, chronic anticoagulation w/ coumadin, chronic LBBB, HTN, h/o postural syncope related to diuretics, stage III CKD, ETOH abuse, bladder CA and PAD who is being seen today for the evaluation of elevated troponin (primary presentation was for foot pain). Initially seen in consultation by Dr. Harl Bowie at [facility] 9/10. Transferred to [facility] for  further care.   Assessment & Plan    1. Elevated hs Troponin: significant increase from baseline (28>>29 last month), now 1,324>>1.350>>1,028. No recent anginal symptomatology. Recent echo showed EF 35-40%. EF unchanged from prior study in 2018.  Continues to deny CP  Given age, multiple cormorbidities including CKD, he is an overall poor cath candidate, particulary in the absence of symptoms or clinical instability.  Troponins appear to be down trending  Will treat medically, no IV heparin currently given INR still above 2.0, plan to heparinize once < 2.0   Continue ASA and statin.   2. Chronic Systolic HF/ NICM: LHC in 2014 w/ normal coronaries. EF 35-40% on recent echo, c/w prior studies. Admitted 2 weeks ago with COPD exacerbation and pulmonary edema due to acute on chronic systolic HF. Discharge weight 152 lbs after diuresising 4.2 L.   No dyspnea. No gross edema on exam, but weight is up 6 lb from previous d/c wt  Home diuretic not ordered. Will resume, Lasix  40 mg BID  Strict I/Os and daily weights  Monitor renal function. Daily BMPs  His  blocker, Toprol XL, was held on admit due to bradycardia  Continue Bidil   Monitor volume status   3. Fever/leukocytosis: due to LE cellulitis with sepsis physiology   On broad spectrum antibiotics, Vanc + rocephin   4. Claudication: history of PAD, prior AKA. Per Cardiology consult note 9/10, ABIs in ER at [facility] with significant right sided disease.   Plan is for Vascular consult now pt has been transferred to [facility]  5. Acute on Chronic Kidney Disease: Stage III CKD at baseline. Baseline SCr ~1.2-1.4. Increase to 1.69 this admit.   Improved today, down to 1.59  Monitor   6. HTN: BP controlled  Continue Bidil  Home  blocker on hold due to recent bradycardia   7. PAF: NSR on tele  Rate is controlled. Observe   INR 2.0 today  8. H/o DVT: coumadin on hold.   INR therapeutic at 2.0.  9. Bradycardia: noted on HPI. On  Toprol XL 50 daily for PAF, chronic systolic HF and HTN  Current resting HR in the upper 60s w/ frequent ventricular ectopy  May need to resume low dose  blocker and observer on tele. Will defer to MD  10. Bladder CA:   Continue outpatient f/u    For questions or updates, please contact [facility] Please consult [email or web address] for contact info under       Signed, Lyda Jester, PA-C  12/22/2018, 9:32 AM    History and all data above reviewed.  Patient examined.  I agree with the findings as above.  No chest pain.  He says that he is breathing OK.    The patient exam reveals COR:RRR  ,  Lungs: Clear  ,  Abd: Positive bowel sounds, no rebound no guarding, Ext No edema   .  All available labs, radiology testing, previous records reviewed. Agree with documented assessment and plan.   ELEVATED TROP:  No acute cardiovascular symptoms.  I would suggest conservative management.  Continue warfarin.  I do not think that he needs heparin.  He has home help and usually does not fall so warfarin should be continued but we can have this discussion further as an out patient.  Seems to tolerate low dose beta blocker.  Continue current therapy.   Traylin Merina Behrendt  10:50 AM  12/22/2018

## 2018-12-23 DIAGNOSIS — I5023 Acute on chronic systolic (congestive) heart failure: Secondary | ICD-10-CM

## 2018-12-23 DIAGNOSIS — N183 Chronic kidney disease, stage 3 (moderate): Secondary | ICD-10-CM

## 2018-12-23 DIAGNOSIS — I480 Paroxysmal atrial fibrillation: Secondary | ICD-10-CM

## 2018-12-23 DIAGNOSIS — I10 Essential (primary) hypertension: Secondary | ICD-10-CM

## 2018-12-23 DIAGNOSIS — R001 Bradycardia, unspecified: Secondary | ICD-10-CM

## 2018-12-23 DIAGNOSIS — I428 Other cardiomyopathies: Secondary | ICD-10-CM

## 2018-12-23 LAB — BASIC METABOLIC PANEL
Anion gap: 11 (ref 5–15)
BUN: 31 mg/dL — ABNORMAL HIGH (ref 8–23)
CO2: 25 mmol/L (ref 22–32)
Calcium: 8.7 mg/dL — ABNORMAL LOW (ref 8.9–10.3)
Chloride: 101 mmol/L (ref 98–111)
Creatinine, Ser: 1.92 mg/dL — ABNORMAL HIGH (ref 0.61–1.24)
GFR calc Af Amer: 34 mL/min — ABNORMAL LOW (ref >60)
GFR calc non Af Amer: 29 mL/min — ABNORMAL LOW (ref >60)
Glucose, Bld: 127 mg/dL — ABNORMAL HIGH (ref 70–99)
Potassium: 3.5 mmol/L (ref 3.5–5.1)
Sodium: 137 mmol/L (ref 135–145)

## 2018-12-23 LAB — URINALYSIS, ROUTINE W REFLEX MICROSCOPIC
Bilirubin Urine: NEGATIVE
Glucose, UA: NEGATIVE mg/dL
Ketones, ur: NEGATIVE mg/dL
Nitrite: NEGATIVE
Protein, ur: 100 mg/dL — AB
RBC / HPF: >50 RBC/hpf — ABNORMAL HIGH (ref 0–5)
Specific Gravity, Urine: 1.013 (ref 1.005–1.030)
pH: 5 (ref 5.0–8.0)

## 2018-12-23 LAB — CBC
HCT: 42.2 % (ref 39.0–52.0)
Hemoglobin: 13.5 g/dL (ref 13.0–17.0)
MCH: 28 pg (ref 26.0–34.0)
MCHC: 32 g/dL (ref 30.0–36.0)
MCV: 87.4 fL (ref 80.0–100.0)
Platelets: 127 10*3/uL — ABNORMAL LOW (ref 150–400)
RBC: 4.83 MIL/uL (ref 4.22–5.81)
RDW: 15.6 % — ABNORMAL HIGH (ref 11.5–15.5)
WBC: 11.4 10*3/uL — ABNORMAL HIGH (ref 4.0–10.5)
nRBC: 0 % (ref 0.0–0.2)

## 2018-12-23 LAB — PROCALCITONIN: Procalcitonin: 1.48 ng/mL

## 2018-12-23 LAB — PROTIME-INR
INR: 2 — ABNORMAL HIGH (ref 0.8–1.2)
Prothrombin Time: 22.3 seconds — ABNORMAL HIGH (ref 11.4–15.2)

## 2018-12-23 MED ORDER — HYDRALAZINE HCL 10 MG PO TABS
10.0000 mg | ORAL_TABLET | Freq: Three times a day (TID) | ORAL | Status: DC
  Administered 2018-12-23 – 2018-12-24 (×3): 10 mg via ORAL
  Filled 2018-12-23 (×3): qty 1

## 2018-12-23 MED ORDER — WARFARIN SODIUM 2.5 MG PO TABS
2.5000 mg | ORAL_TABLET | Freq: Once | ORAL | Status: AC
  Administered 2018-12-23: 2.5 mg via ORAL
  Filled 2018-12-23: qty 1

## 2018-12-23 MED ORDER — CARVEDILOL 3.125 MG PO TABS
3.1250 mg | ORAL_TABLET | Freq: Two times a day (BID) | ORAL | Status: DC
  Administered 2018-12-23 – 2018-12-26 (×6): 3.125 mg via ORAL
  Filled 2018-12-23 (×6): qty 1

## 2018-12-23 NOTE — Progress Notes (Signed)
PROGRESS NOTE                                                                                                                                                                                                             Patient Demographics:    Scott [name], is a 85 y.o. male, DOB - 15-Jan-1922, ES:[id number]  Admit date - 12/21/2018   Admitting Physician Costin Karlyne Greenspan, MD  Outpatient Primary MD for the patient is Dettinger, Fransisca Kaufmann, MD  LOS - 2   Chief Complaint  Patient presents with   Fall       Brief Narrative    85 y.o. 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 fib, hypertension, chronic kidney disease stage III, COPD, bladder cancer, CAD, left AKA who is being brought to the hospital by family due to mild confusion this morning.  They also report that last night, patient was found down caught in between the bed and the wall.  Patient denies any syncopal episodes and tells me he lost balance and fell.  He has a history of AKA but uses his right foot to pivot.  He denies any chest pain, denies any abdominal pain, nausea or vomiting.  He denies any fever or chills.  He does complain of right foot pain when he touch it but not at rest.  He is alert and oriented x4 however not forthcoming with answering questions.  The nephew, who is the main caregiver is at bedside and contributed to the story.  He was recently admitted to the hospital and discharge 10 days ago due to COPD exacerbation and pulmonary edema.  ED Course: In the emergency room patient is found to have a temp of 100.4, slightly tachypneic, the rest of the vital signs are stable. His blood work shows a creatinine of 1.69, high-sensitivity troponin 1324, lactic acid initially 1.7 on repeat 2.2.  He has a white count of 15.  Covid was negative.  CT head and C-spine are negative.  Ankle x-ray negative. Chest x-ray without acute  abnormalities, mild cardiomegaly and chronic interstitial lung changes.  He had an ABI given poor pulses on the right lower extremity which showed severe progressive arterial occlusive disease at rest.  Dr. Carlis Abbott with vascular surgery and Dr. Harl Bowie with cardiology were consulted.  Patient will be admitted to [facility] for vascular surgery to be able to consult on him.    Subjective:    Scott [name] today has, No headache, No chest pain, No abdominal pain , ports some right lower extremity pain.   Assessment  & Plan :    Principal Problem:   Foot pain Active Problems:   Hyperlipidemia   CAD- non obstructive disease by cath 3/14   Bladder cancer (HCC)   Chronic combined systolic and diastolic heart failure (HCC)   NICM (nonischemic cardiomyopathy)- EF 20-25% by Echo 04/12/13   BPH (benign prostatic hyperplasia)   NSTEMI (non-ST elevated myocardial infarction) ([place])   PAD (peripheral artery disease) (HCC)   Essential hypertension   Chronic kidney disease (CKD) stage G3a/A1, moderately decreased glomerular filtration rate (GFR) between 45-59 mL/min/1.73 square meter and albuminuria creatinine ratio less than 30 mg/g (HCC)   Paroxysmal atrial fibrillation (HCC)   Alzheimer disease (HCC)   Cellulitis  Elevated troponin in the setting of known coronary artery disease - significant increase from baseline (28>>29 last month), now 1,324>>1.350>>1,028, finding concerning for NSTEMI -Cardiology input greatly appreciated, given his age, multiple comorbidities, CKD, and overall poor health, he is currently a poor cath candidate, particularly in the absence of chest pain. -Continue with aspirin, statin, Imdur, started on Coreg, as well he remains on warfarin  Right foot pain/PVD with cellulitis. -Patient currently on broad-spectrum antibiotics with vancomycin and ceftriaxone.  He continues to spike fever, as well worsening leukocytosis, and procalcitonin is elevated. -Consult input greatly  appreciated so far no indication for intervention especially in the absence of any ongoing symptoms, giving hospitalization for elevated troponins.  AKI on Chronic kidney disease stage III -Baseline creatinine between 1.2 and 1.5, continued has increased to 1.9 today, diuresis has been stopped, continue to monitor closely .  COPD -Recently admitted for COPD exacerbation, currently has no wheezing, stable, continue home medications.  Paroxysmal A. Fib -currently in sinus,  -on warfarin, pharmacy to dose   Chronic combined systolic and diastolic CHF -Most recent EF 35-40% just the end of August 2020.  Appears euvolemic.  Given fever/cellulitis along with mildly elevated lactic acid and creatinine. -Resume back on beta-blockers given bradycardia resolved, on Coreg 3.125 mg twice daily, he is currently on Imdur, started on BiDil as well  Hypertension -Please see above discussion  History of DVT/PE -On Coumadin at home  Dementia without behavioral disturbances -Continue Aricept, however patient alert and oriented x4 on my evaluation  Bladder cancer -Outpatient follow-up   Code Status : DNR  Family Communication  : Discussed with patient  Disposition Plan  : Home once stable  Barriers For Discharge : Still spiking fever on IV antibiotics  Consults  : Cardiology, vascular surgery  Procedures  : None  DVT Prophylaxis  : Warfarin  Lab Results  Component Value Date   PLT 127 (L) 12/23/2018    Antibiotics  :    Anti-infectives (From admission, onward)   Start     Dose/Rate Route Frequency Ordered Stop   12/23/18 0500  vancomycin (VANCOCIN) IVPB 1000 mg/200 mL premix     1,000 mg 200 mL/hr over 60 Minutes Intravenous Every 36 hours 12/22/18 1109     12/22/18 1600  vancomycin (VANCOCIN) 500 mg in sodium chloride 0.9 % 100 mL IVPB  Status:  Discontinued     500 mg 100 mL/hr over 60 Minutes Intravenous Every 24 hours 12/21/18 1518 12/22/18 1109   12/22/18 1500   cefTRIAXone (ROCEPHIN)  1 g in sodium chloride 0.9 % 100 mL IVPB     1 g 200 mL/hr over 30 Minutes Intravenous Every 24 hours 12/21/18 2139     12/21/18 1630  vancomycin (VANCOCIN) IVPB 750 mg/150 ml premix     750 mg 150 mL/hr over 60 Minutes Intravenous  Once 12/21/18 1515 12/21/18 1823   12/21/18 1530  vancomycin (VANCOCIN) IVPB 750 mg/150 ml premix     750 mg 150 mL/hr over 60 Minutes Intravenous  Once 12/21/18 1515 12/21/18 1720   12/21/18 1500  vancomycin (VANCOCIN) IVPB 1000 mg/200 mL premix  Status:  Discontinued     1,000 mg 200 mL/hr over 60 Minutes Intravenous  Once 12/21/18 1446 12/21/18 1515   12/21/18 1500  cefTRIAXone (ROCEPHIN) 2 g in sodium chloride 0.9 % 100 mL IVPB     2 g 200 mL/hr over 30 Minutes Intravenous  Once 12/21/18 1446 12/21/18 1550        Objective:   Vitals:   12/22/18 1935 12/23/18 0420 12/23/18 0750 12/23/18 1100  BP: 119/78 127/84 (!) 139/95   Pulse: 98 86 65   Resp: 18 18 (!) 22   Temp: (!) 100.8 F (38.2 C) 98.5 F (36.9 C) 99.4 F (37.4 C)   TempSrc: Oral Oral Oral   SpO2: 94% 91% 100% 95%  Weight:  71.9 kg    Height:        Wt Readings from Last 3 Encounters:  12/23/18 71.9 kg  12/12/18 68.9 kg  10/20/18 72.5 kg     Intake/Output Summary (Last 24 hours) at 12/23/2018 1506 Last data filed at 12/23/2018 1300 Gross per 24 hour  Intake 1226.04 ml  Output 325 ml  Net 901.04 ml     Physical Exam  Awake Alert, Oriented X 3, No new F.N deficits, Normal affect Symmetrical Chest wall movement, Good air movement bilaterally, CTAB RRR,No Gallops,Rubs or new Murmurs, No Parasternal Heave +ve B.Sounds, Abd Soft, No tenderness, No rebound - guarding or rigidity. No Cyanosis, Clubbing or edema, but has some erythema in right lower extremity, left lower extremity AKA   Data Review:    CBC Recent Labs  Lab 12/20/18 1117 12/21/18 1209 12/22/18 0123 12/23/18 0638  WBC 7.9 15.0* 10.7* 11.4*  HGB 13.9 14.5 13.4 13.5  HCT 42.3 46.1  42.3 42.2  PLT 194 163 139* 127*  MCV 85 87.6 86.5 87.4  MCH 27.9 27.6 27.4 28.0  MCHC 32.9 31.5 31.7 32.0  RDW 14.6 16.0* 15.7* 15.6*  LYMPHSABS 1.7 1.0  --   --   MONOABS  --  0.8  --   --   EOSABS 0.1 0.0  --   --   BASOSABS 0.0 0.0  --   --     Chemistries  Recent Labs  Lab 12/20/18 1117 12/21/18 1209 12/22/18 0123 12/23/18 0638  NA 146* 141 140 137  K 3.8 3.7 3.6 3.5  CL 102 100 103 101  CO2 29 31 29 25   GLUCOSE 75 89 123* 127*  BUN 32 35* 31* 31*  CREATININE 1.43* 1.69* 1.59* 1.92*  CALCIUM 9.1 8.9 8.7* 8.7*  AST 15 18 21   --   ALT 21 20 17   --   ALKPHOS 153* 122 107  --   BILITOT 0.3 1.2 1.0  --    ------------------------------------------------------------------------------------------------------------------ No results for input(s): CHOL, HDL, LDLCALC, TRIG, CHOLHDL, LDLDIRECT in the last 72 hours.  Lab Results  Component Value Date   HGBA1C 5.8 (H) 11/28/2015   ------------------------------------------------------------------------------------------------------------------  No results for input(s): TSH, T4TOTAL, T3FREE, THYROIDAB in the last 72 hours.  Invalid input(s): FREET3 ------------------------------------------------------------------------------------------------------------------ No results for input(s): VITAMINB12, FOLATE, FERRITIN, TIBC, IRON, RETICCTPCT in the last 72 hours.  Coagulation profile Recent Labs  Lab 12/21/18 1209 12/22/18 0123 12/23/18 0638  INR 2.1* 2.0* 2.0*    No results for input(s): DDIMER in the last 72 hours.  Cardiac Enzymes No results for input(s): CKMB, TROPONINI, MYOGLOBIN in the last 168 hours.  Invalid input(s): CK ------------------------------------------------------------------------------------------------------------------    Component Value Date/Time   BNP 1,713.0 (H) 12/10/2018 1251    Inpatient Medications  Scheduled Meds:  aspirin EC  81 mg Oral Daily   carvedilol  3.125 mg Oral BID WC     donepezil  10 mg Oral QHS   fluticasone  1 spray Each Nare QHS   hydrALAZINE  10 mg Oral Q8H   influenza vaccine adjuvanted  0.5 mL Intramuscular Tomorrow-1000   isosorbide dinitrate  30 mg Oral BID   mirabegron ER  25 mg Oral q morning - 10a   sertraline  50 mg Oral q morning - 10a   simvastatin  10 mg Oral QHS   warfarin  2.5 mg Oral ONCE-1800   Warfarin - Pharmacist Dosing Inpatient   Does not apply q1800   Continuous Infusions:  sodium chloride 250 mL (12/22/18 1614)   cefTRIAXone (ROCEPHIN)  IV 1 g (12/23/18 1351)   vancomycin 1,000 mg (12/23/18 0459)   PRN Meds:.sodium chloride, acetaminophen **OR** acetaminophen, ipratropium-albuterol, ondansetron **OR** ondansetron (ZOFRAN) IV  Micro Results Recent Results (from the past 240 hour(s))  Culture, blood (routine x 2)     Status: None (Preliminary result)   Collection Time: 12/21/18 12:22 PM   Specimen: BLOOD RIGHT ARM  Result Value Ref Range Status   Specimen Description BLOOD RIGHT ARM DRAWN BY RN  Final   Special Requests   Final    BOTTLES DRAWN AEROBIC AND ANAEROBIC Blood Culture adequate volume   Culture   Final    NO GROWTH 2 DAYS Performed at [facility], [address], [place], [place] [address]    Report Status PENDING  Incomplete  SARS Coronavirus 2 [facility] order, Performed in [place] hospital lab) Nasopharyngeal Nasopharyngeal Swab     Status: None   Collection Time: 12/21/18  1:39 PM   Specimen: Nasopharyngeal Swab  Result Value Ref Range Status   SARS Coronavirus 2 NEGATIVE NEGATIVE Final    Comment: (NOTE) If result is NEGATIVE SARS-CoV-2 target nucleic acids are NOT DETECTED. The SARS-CoV-2 RNA is generally detectable in upper and lower  respiratory specimens during the acute phase of infection. The lowest  concentration of SARS-CoV-2 viral copies this assay can detect is 250  copies / mL. A negative result does not preclude SARS-CoV-2 infection  and should not be used as the sole  basis for treatment or other  patient management decisions.  A negative result may occur with  improper specimen collection / handling, submission of specimen other  than nasopharyngeal swab, presence of viral mutation(s) within the  areas targeted by this assay, and inadequate number of viral copies  (<250 copies / mL). A negative result must be combined with clinical  observations, patient history, and epidemiological information. If result is POSITIVE SARS-CoV-2 target nucleic acids are DETECTED. The SARS-CoV-2 RNA is generally detectable in upper and lower  respiratory specimens dur ing the acute phase of infection.  Positive  results are indicative of active infection with SARS-CoV-2.  Clinical  correlation  with patient history and other diagnostic information is  necessary to determine patient infection status.  Positive results do  not rule out bacterial infection or co-infection with other viruses. If result is PRESUMPTIVE POSTIVE SARS-CoV-2 nucleic acids MAY BE PRESENT.   A presumptive positive result was obtained on the submitted specimen  and confirmed on repeat testing.  While 2019 novel coronavirus  (SARS-CoV-2) nucleic acids may be present in the submitted sample  additional confirmatory testing may be necessary for epidemiological  and / or clinical management purposes  to differentiate between  SARS-CoV-2 and other Sarbecovirus currently known to infect humans.  If clinically indicated additional testing with an alternate test  methodology [phone]) is advised. The SARS-CoV-2 RNA is generally  detectable in upper and lower respiratory sp ecimens during the acute  phase of infection. The expected result is Negative. Fact Sheet for Patients:  StrictlyIdeas.no Fact Sheet for Healthcare Providers: BankingDealers.co.za This test is not yet approved or cleared by the [place] FDA and has been authorized for detection  and/or diagnosis of SARS-CoV-2 by FDA under an Emergency Use Authorization (EUA).  This EUA will remain in effect (meaning this test can be used) for the duration of the COVID-19 declaration under Section 564(b)(1) of the Act, 21 U.S.C. section 360bbb-3(b)(1), unless the authorization is terminated or revoked sooner. Performed at [facility], [address], [place], [place] [address]   Culture, blood (routine x 2)     Status: None (Preliminary result)   Collection Time: 12/21/18  2:22 PM   Specimen: BLOOD LEFT WRIST  Result Value Ref Range Status   Specimen Description BLOOD LEFT WRIST  Final   Special Requests   Final    BOTTLES DRAWN AEROBIC ONLY Blood Culture results may not be optimal due to an inadequate volume of blood received in culture bottles   Culture   Final    NO GROWTH 2 DAYS Performed at [facility], [address], [place], [place] [address]    Report Status PENDING  Incomplete    Radiology Reports Dg Chest 1 View  Result Date: 12/21/2018 CLINICAL DATA:  Fever. Fell last night. Smoker. EXAM: CHEST  1 VIEW COMPARISON:  12/10/2018 and chest CT dated 07/04/2018 FINDINGS: Mildly enlarged cardiac silhouette with an interval decrease in size. Decreased prominence of the pulmonary vasculature. Stable mild prominence of the interstitial markings. No pleural fluid. Stable vascular tortuosity to the right of the upper mediastinum. Thoracic spine degenerative changes. IMPRESSION: No acute abnormality. Mild cardiomegaly and mild chronic interstitial lung disease. Electronically Signed   By: Claudie Revering M.D.   On: 12/21/2018 13:09   Dg Ankle Complete Right  Result Date: 12/21/2018 CLINICAL DATA:  Ankle pain after a fall last night. EXAM: RIGHT ANKLE - COMPLETE 3+ VIEW COMPARISON:  None. FINDINGS: There is no evidence of fracture, dislocation, or joint effusion. A plantar calcaneal enthesophyte is noted. Vascular calcifications are seen. Soft tissues are unremarkable. IMPRESSION: No  acute osseous injury. Electronically Signed   By: Zerita Boers M.D.   On: 12/21/2018 13:09   Ct Head Wo Contrast  Result Date: 12/21/2018 CLINICAL DATA:  Patient status post fall. EXAM: CT HEAD WITHOUT CONTRAST CT CERVICAL SPINE WITHOUT CONTRAST TECHNIQUE: Multidetector CT imaging of the head and cervical spine was performed following the standard protocol without intravenous contrast. Multiplanar CT image reconstructions of the cervical spine were also generated. COMPARISON:  Brain CT October 03, 2014 FINDINGS: CT HEAD FINDINGS Brain: Ventricles and sulci are appropriate for patient's age.  No evidence for acute cortically based infarct, intracranial hemorrhage, mass lesion or mass-effect. Unchanged remote infarct within the left centrum semiovale. Periventricular and subcortical white matter hypodensity compatible with chronic microvascular ischemic changes. Vascular: Unremarkable Skull: Intact. Sinuses/Orbits: Opacification of the left maxillary sinus Remainder of the paranasal sinuses are well aerated. Orbits are unremarkable. Other: None. CT CERVICAL SPINE FINDINGS Alignment: Normal anatomic alignment. Skull base and vertebrae: Intact. Soft tissues and spinal canal: No prevertebral fluid or swelling. No visible canal hematoma. Disc levels: Degenerative disc disease most pronounced C6-7. No evidence for acute fracture. Upper chest: Emphysematous changes. Other: There are 2 mildly prominent lower left cervical lymph nodes (image 66; series 8) measuring up to 10 mm, nonspecific however potentially reactive. IMPRESSION: No acute intracranial process. Atrophy and chronic microvascular ischemic changes. No acute cervical spine fracture. Electronically Signed   By: Lovey Newcomer M.D.   On: 12/21/2018 13:07   Ct Cervical Spine Wo Contrast  Result Date: 12/21/2018 CLINICAL DATA:  Patient status post fall. EXAM: CT HEAD WITHOUT CONTRAST CT CERVICAL SPINE WITHOUT CONTRAST TECHNIQUE: Multidetector CT imaging of the head  and cervical spine was performed following the standard protocol without intravenous contrast. Multiplanar CT image reconstructions of the cervical spine were also generated. COMPARISON:  Brain CT October 03, 2014 FINDINGS: CT HEAD FINDINGS Brain: Ventricles and sulci are appropriate for patient's age. No evidence for acute cortically based infarct, intracranial hemorrhage, mass lesion or mass-effect. Unchanged remote infarct within the left centrum semiovale. Periventricular and subcortical white matter hypodensity compatible with chronic microvascular ischemic changes. Vascular: Unremarkable Skull: Intact. Sinuses/Orbits: Opacification of the left maxillary sinus Remainder of the paranasal sinuses are well aerated. Orbits are unremarkable. Other: None. CT CERVICAL SPINE FINDINGS Alignment: Normal anatomic alignment. Skull base and vertebrae: Intact. Soft tissues and spinal canal: No prevertebral fluid or swelling. No visible canal hematoma. Disc levels: Degenerative disc disease most pronounced C6-7. No evidence for acute fracture. Upper chest: Emphysematous changes. Other: There are 2 mildly prominent lower left cervical lymph nodes (image 66; series 8) measuring up to 10 mm, nonspecific however potentially reactive. IMPRESSION: No acute intracranial process. Atrophy and chronic microvascular ischemic changes. No acute cervical spine fracture. Electronically Signed   By: Lovey Newcomer M.D.   On: 12/21/2018 13:07   US Arterial Abi (screening Lower Extremity)  Result Date: 12/21/2018 CLINICAL DATA:  Left BKA 2016.  Right lower extremity pain. EXAM: NONINVASIVE PHYSIOLOGIC VASCULAR STUDY OF BILATERAL LOWER EXTREMITIES TECHNIQUE: Evaluation of both lower extremities were performed at rest, including calculation of ankle-brachial indices with single level Doppler, pressure and pulse volume recording. COMPARISON:  02/11/2015 FINDINGS: Right ABI:  0.48 (previously 0.57 by report) Left ABI:  Non calculable Right Lower  Extremity: Weak monophasic arterial waveforms at the distal calf Left Lower Extremity:  Prior BKA IMPRESSION: Severe progressive right lower extremity arterial occlusive disease at rest. Electronically Signed   By: Lucrezia Europe M.D.   On: 12/21/2018 14:14   Dg Chest Port 1 View  Result Date: 12/10/2018 CLINICAL DATA:  Shortness of breath. EXAM: PORTABLE CHEST 1 VIEW COMPARISON:  Sep 07, 2018 FINDINGS: Stable cardiomegaly. The cardiomediastinal silhouette is stable. No pneumothorax. No nodules or masses. No focal infiltrates or overt edema. IMPRESSION: No active disease. Electronically Signed   By: Dorise Bullion III M.D   On: 12/10/2018 13:31      Emeline Gins Rindy Kollman M.D on 12/23/2018 at 3:06 PM  Between 7am to 7pm - Pager - [phone]  After 7pm go to [email or web address] -  password [facility]  [facility] -  Office  [phone]

## 2018-12-23 NOTE — Progress Notes (Addendum)
Progress Note  Patient Name: Scott [name] Date of Encounter: 12/23/2018  Primary Cardiologist: Minus Breeding, MD   Subjective   Denies any chest pain or SOB  Inpatient Medications    Scheduled Meds:  aspirin EC  81 mg Oral Daily   donepezil  10 mg Oral QHS   fluticasone  1 spray Each Nare QHS   furosemide  40 mg Oral BID   influenza vaccine adjuvanted  0.5 mL Intramuscular Tomorrow-1000   isosorbide dinitrate  30 mg Oral BID   mirabegron ER  25 mg Oral q morning - 10a   sertraline  50 mg Oral q morning - 10a   simvastatin  10 mg Oral QHS   Warfarin - Pharmacist Dosing Inpatient   Does not apply q1800   Continuous Infusions:  sodium chloride 250 mL (12/22/18 1614)   cefTRIAXone (ROCEPHIN)  IV 1 g (12/22/18 1609)   vancomycin 1,000 mg (12/23/18 0459)   PRN Meds: sodium chloride, acetaminophen **OR** acetaminophen, ipratropium-albuterol, ondansetron **OR** ondansetron (ZOFRAN) IV   Vital Signs    Vitals:   12/22/18 1830 12/22/18 1935 12/23/18 0420 12/23/18 0750  BP:  119/78 127/84 (!) 139/95  Pulse: 91 98 86 65  Resp:  18 18 (!) 22  Temp:  (!) 100.8 F (38.2 C) 98.5 F (36.9 C) 99.4 F (37.4 C)  TempSrc:  Oral Oral Oral  SpO2: 99% 94% 91% 100%  Weight:   71.9 kg   Height:        Intake/Output Summary (Last 24 hours) at 12/23/2018 1023 Last data filed at 12/23/2018 0912 Gross per 24 hour  Intake 1049.04 ml  Output 325 ml  Net 724.04 ml   Last 3 Weights 12/23/2018 12/22/2018 12/21/2018  Weight (lbs) 158 lb 8.2 oz 158 lb 12.8 oz 156 lb 12 oz  Weight (kg) 71.9 kg 72.031 kg 71.1 kg      Telemetry    NSR with PVCs- Personally Reviewed  ECG    No new EKG to review- Personally Reviewed  Physical Exam   GEN: Well nourished, well developed in no acute distress HEENT: Normal NECK: No JVD; No carotid bruits LYMPHATICS: No lymphadenopathy CARDIAC:irregularly irregular, no murmurs, rubs, gallops RESPIRATORY:  Clear to auscultation without  rales, wheezing or rhonchi  ABDOMEN: Soft, non-tender, non-distended MUSCULOSKELETAL:  No edema; No deformity  SKIN: Warm and dry NEUROLOGIC:  Alert and oriented x 3 PSYCHIATRIC:  Normal affect    Labs    High Sensitivity Troponin:   Recent Labs  Lab 12/10/18 1249 12/10/18 1429 12/21/18 1209 12/21/18 1422 12/22/18 0837  TROPONINIHS 29* 28* 1,324* 1,350* 1,028*      Chemistry Recent Labs  Lab 12/20/18 1117 12/21/18 1209 12/22/18 0123 12/23/18 0638  NA 146* 141 140 137  K 3.8 3.7 3.6 3.5  CL 102 100 103 101  CO2 29 31 29 25   GLUCOSE 75 89 123* 127*  BUN 32 35* 31* 31*  CREATININE 1.43* 1.69* 1.59* 1.92*  CALCIUM 9.1 8.9 8.7* 8.7*  PROT 5.8* 6.5 5.8*  --   ALBUMIN 3.9 3.6 2.9*  --   AST 15 18 21   --   ALT 21 20 17   --   ALKPHOS 153* 122 107  --   BILITOT 0.3 1.2 1.0  --   GFRNONAA 41* 34* 36* 29*  GFRAA 48* 39* 42* 34*  ANIONGAP  --  10 8 11      Hematology Recent Labs  Lab 12/21/18 1209 12/22/18 0123 12/23/18 0638  WBC  15.0* 10.7* 11.4*  RBC 5.26 4.89 4.83  HGB 14.5 13.4 13.5  HCT 46.1 42.3 42.2  MCV 87.6 86.5 87.4  MCH 27.6 27.4 28.0  MCHC 31.5 31.7 32.0  RDW 16.0* 15.7* 15.6*  PLT 163 139* 127*    BNPNo results for input(s): BNP, PROBNP in the last 168 hours.   DDimer No results for input(s): DDIMER in the last 168 hours.   Radiology    Dg Chest 1 View  Result Date: 12/21/2018 CLINICAL DATA:  Fever. Fell last night. Smoker. EXAM: CHEST  1 VIEW COMPARISON:  12/10/2018 and chest CT dated 07/04/2018 FINDINGS: Mildly enlarged cardiac silhouette with an interval decrease in size. Decreased prominence of the pulmonary vasculature. Stable mild prominence of the interstitial markings. No pleural fluid. Stable vascular tortuosity to the right of the upper mediastinum. Thoracic spine degenerative changes. IMPRESSION: No acute abnormality. Mild cardiomegaly and mild chronic interstitial lung disease. Electronically Signed   By: Claudie Revering M.D.   On:  12/21/2018 13:09   Dg Ankle Complete Right  Result Date: 12/21/2018 CLINICAL DATA:  Ankle pain after a fall last night. EXAM: RIGHT ANKLE - COMPLETE 3+ VIEW COMPARISON:  None. FINDINGS: There is no evidence of fracture, dislocation, or joint effusion. A plantar calcaneal enthesophyte is noted. Vascular calcifications are seen. Soft tissues are unremarkable. IMPRESSION: No acute osseous injury. Electronically Signed   By: Zerita Boers M.D.   On: 12/21/2018 13:09   Ct Head Wo Contrast  Result Date: 12/21/2018 CLINICAL DATA:  Patient status post fall. EXAM: CT HEAD WITHOUT CONTRAST CT CERVICAL SPINE WITHOUT CONTRAST TECHNIQUE: Multidetector CT imaging of the head and cervical spine was performed following the standard protocol without intravenous contrast. Multiplanar CT image reconstructions of the cervical spine were also generated. COMPARISON:  Brain CT October 03, 2014 FINDINGS: CT HEAD FINDINGS Brain: Ventricles and sulci are appropriate for patient's age. No evidence for acute cortically based infarct, intracranial hemorrhage, mass lesion or mass-effect. Unchanged remote infarct within the left centrum semiovale. Periventricular and subcortical white matter hypodensity compatible with chronic microvascular ischemic changes. Vascular: Unremarkable Skull: Intact. Sinuses/Orbits: Opacification of the left maxillary sinus Remainder of the paranasal sinuses are well aerated. Orbits are unremarkable. Other: None. CT CERVICAL SPINE FINDINGS Alignment: Normal anatomic alignment. Skull base and vertebrae: Intact. Soft tissues and spinal canal: No prevertebral fluid or swelling. No visible canal hematoma. Disc levels: Degenerative disc disease most pronounced C6-7. No evidence for acute fracture. Upper chest: Emphysematous changes. Other: There are 2 mildly prominent lower left cervical lymph nodes (image 66; series 8) measuring up to 10 mm, nonspecific however potentially reactive. IMPRESSION: No acute intracranial  process. Atrophy and chronic microvascular ischemic changes. No acute cervical spine fracture. Electronically Signed   By: Lovey Newcomer M.D.   On: 12/21/2018 13:07   Ct Cervical Spine Wo Contrast  Result Date: 12/21/2018 CLINICAL DATA:  Patient status post fall. EXAM: CT HEAD WITHOUT CONTRAST CT CERVICAL SPINE WITHOUT CONTRAST TECHNIQUE: Multidetector CT imaging of the head and cervical spine was performed following the standard protocol without intravenous contrast. Multiplanar CT image reconstructions of the cervical spine were also generated. COMPARISON:  Brain CT October 03, 2014 FINDINGS: CT HEAD FINDINGS Brain: Ventricles and sulci are appropriate for patient's age. No evidence for acute cortically based infarct, intracranial hemorrhage, mass lesion or mass-effect. Unchanged remote infarct within the left centrum semiovale. Periventricular and subcortical white matter hypodensity compatible with chronic microvascular ischemic changes. Vascular: Unremarkable Skull: Intact. Sinuses/Orbits: Opacification of the left  maxillary sinus Remainder of the paranasal sinuses are well aerated. Orbits are unremarkable. Other: None. CT CERVICAL SPINE FINDINGS Alignment: Normal anatomic alignment. Skull base and vertebrae: Intact. Soft tissues and spinal canal: No prevertebral fluid or swelling. No visible canal hematoma. Disc levels: Degenerative disc disease most pronounced C6-7. No evidence for acute fracture. Upper chest: Emphysematous changes. Other: There are 2 mildly prominent lower left cervical lymph nodes (image 66; series 8) measuring up to 10 mm, nonspecific however potentially reactive. IMPRESSION: No acute intracranial process. Atrophy and chronic microvascular ischemic changes. No acute cervical spine fracture. Electronically Signed   By: Lovey Newcomer M.D.   On: 12/21/2018 13:07   US Arterial Abi (screening Lower Extremity)  Result Date: 12/21/2018 CLINICAL DATA:  Left BKA 2016.  Right lower extremity pain.  EXAM: NONINVASIVE PHYSIOLOGIC VASCULAR STUDY OF BILATERAL LOWER EXTREMITIES TECHNIQUE: Evaluation of both lower extremities were performed at rest, including calculation of ankle-brachial indices with single level Doppler, pressure and pulse volume recording. COMPARISON:  02/11/2015 FINDINGS: Right ABI:  0.48 (previously 0.57 by report) Left ABI:  Non calculable Right Lower Extremity: Weak monophasic arterial waveforms at the distal calf Left Lower Extremity:  Prior BKA IMPRESSION: Severe progressive right lower extremity arterial occlusive disease at rest. Electronically Signed   By: Lucrezia Europe M.D.   On: 12/21/2018 14:14    Cardiac Studies   2D Echo 12/11/18   1. The left ventricle has moderately reduced systolic function, with an ejection fraction of 35- 40%. The cavity size was normal. There is mildly increased left ventricular wall thickness. Left ventricular diastolic Doppler parameters are consistent with impaired relaxation. Elevated left ventricular end-diastolic pressure. 2. There is akinesis of the left ventricular, apical anteroseptal wall. 3. There is akinesis of the left ventricular, basal-mid inferior wall. 4. The right ventricle has moderately reduced systolic function. The cavity was normal. There is no increase in right ventricular wall thickness. Right ventricular systolic pressure is severely elevated with an estimated pressure of 70.2 mmHg. 5. Left atrial size was mildly dilated. 6. The aortic valve is tricuspid. Moderate calcification of the aortic valve. Moderate aortic annular calcification noted. 7. The mitral valve is degenerative. Mild thickening of the mitral valve leaflet. Moderate calcification of the mitral valve leaflet. There is moderate mitral annular calcification present. 8. The tricuspid valve is grossly normal. 9. The aorta is normal unless otherwise noted.   Patient Profile     QWANELL PELEGRIN is a 85 y.o. male with a hx of NICM, PAF, h/o DVT, chronic  anticoagulation w/ coumadin, chronic LBBB, HTN, h/o postural syncope related to diuretics, stage III CKD, ETOH abuse, bladder CA and PAD who is being seen today for the evaluation of elevated troponin (primary presentation was for foot pain). Initially seen in consultation by Dr. Harl Bowie at [facility] 9/10. Transferred to [facility] for further care.   Assessment & Plan    1. Elevated hs Troponin: significant increase from baseline (28>>29 last month), now 1,324>>1.350>>1,028. No recent anginal symptomatology. Recent echo showed EF 35-40%. EF unchanged from prior study in 2018.  Continues to deny CP  Given age, multiple cormorbidities including CKD, he is an overall poor cath candidate, particulary in the absence of symptoms or clinical instability.  Troponins appear to be down trending  Will treat medically  Continue ASA, Long acting nitrates and statin.  He had been on Toprol XL 50mg  daily outpt but stopped on admit due to bradycardia.  Now HR trending at times up to 100bpm  Start Carvedilol 3.125mg  BID and follow on tele  2. Chronic Systolic HF/ NICM: LHC in 2014 w/ normal coronaries. EF 35-40% on recent echo, c/w prior studies. Admitted 2 weeks ago with COPD exacerbation and pulmonary edema due to acute on chronic systolic HF. Discharge weight 152 lbs after diuresising 4.2 L.   He denies any SOB this am and has no LE edema on exam  Started back on Lasix 40 mg BID yesterday  I&Os appear incomplete  Creatinine bumped to 1.92 today  Hold further diuretics for now  Notes from yesterday state that he was on Bidil but currently only on long acting nitrate  add Hydralazine 10mg  TID and if BP tolerates can change to Bidil at discharge  Start low dose carvedilol  3. Fever/leukocytosis: due to LE cellulitis with sepsis physiology   On broad spectrum antibiotics, Vanc + rocephin   4. Claudication: history of PAD, prior AKA. Per Cardiology consult note 9/10, ABIs in ER at [facility] with significant right  sided disease.   Plan is for Vascular consult now pt has been transferred to [facility]  5. Acute on Chronic Kidney Disease:   Stage III CKD at baseline. Baseline SCr ~1.2-1.4.   Increased today to 1.92  Hold diuretic today   Repeat BMET in am  6. HTN:  BP controlled  Home  blocker on hold due to recent bradycardia   Starting carvedilol low dose and adding low dose hydralazine for CHF  7. PAF:   NSR on tele  INR 2.0 today  Continue warfarin  8. H/o DVT:   INR therapeutic at 2 today  Continue warfarin  9. Bradycardia:   He had been on Toprol XL 50mg  at home and this was stopped due to bracydardia  Current resting HR 65-86bpm with PVCs  Will restart BB with Carvedilol 3.125mg  BID and follow on Tele  I have spent a total of 35 minutes with patient reviewing 2D echo , telemetry, EKGs, labs and examining patient as well as establishing an assessment and plan that was discussed with the patient.  > 50% of time was spent in direct patient care.    For questions or updates, please contact [place] Please consult [email or web address] for contact info under      Signed, Fransico Him, MD  12/23/2018, 10:23 AM

## 2018-12-23 NOTE — Progress Notes (Signed)
ANTICOAGULATION CONSULT NOTE - Follow up [place] for heparin Indication: Atrial Fibrillation  Allergies  Allergen Reactions  . Lipitor [Atorvastatin] Other (See Comments)    Unknown rxn per pt    Vital Signs: Temp: 99.4 F (37.4 C) (09/12 0750) Temp Source: Oral (09/12 0750) BP: 139/95 (09/12 0750) Pulse Rate: 65 (09/12 0750)  Labs: Recent Labs    12/21/18 1209 12/21/18 1422 12/22/18 0123 12/22/18 0837 12/23/18 0638  HGB 14.5  --  13.4  --  13.5  HCT 46.1  --  42.3  --  42.2  PLT 163  --  139*  --  127*  LABPROT 22.9*  --  22.2*  --  22.3*  INR 2.1*  --  2.0*  --  2.0*  CREATININE 1.69*  --  1.59*  --  1.92*  TROPONINIHS 1,324* 1,350*  --  1,028*  --     Estimated Creatinine Clearance: 20.8 mL/min (A) (by C-G formula based on SCr of 1.92 mg/dL (H)).   Medical History: Past Medical History:  Diagnosis Date  . Arthritis    "all over"  . Atrial fibrillation ([place])   . Benign localized prostatic hyperplasia with lower urinary tract symptoms (LUTS)   . Bradycardia 11/28/2015   Severe-HR in the 20s to 30s following intubation for urological procedure.  Marland Kitchen CAD- non obstructive disease by cath 3/14 cardiologist-  dr hochrein  . Chronic lower back pain   . COPD (chronic obstructive pulmonary disease) ([place])   . DDD (degenerative disc disease)   . Depression   . Dyspnea    w/ exertion and lying down (raise head)  . ETOH abuse   . GERD (gastroesophageal reflux disease)   . Glaucoma   . Glaucoma, both eyes   . History of bladder cancer urologist-- dr Jeffie Pollock   first dx 2012--  s/p TURBT's  . History of DVT of lower extremity 03/2013   bilateral   . History of pulmonary embolus (PE) 04/2013  . HTN (hypertension)   . Hyperlipidemia   . Hypertension   . LBBB (left bundle branch block)    chronic  . Non-ischemic cardiomyopathy ([place])    last echo 01-18-2017, ef 35-40%  . NSVT (nonsustained ventricular tachycardia) ([place])    a. NSVT 06/2012; NSVT also  seen during 03/2013 adm. b. Med rx. Not candidate for ICD given adv age.  Marland Kitchen PAD (peripheral artery disease) ([place])   . 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 PVCs (premature ventricular contractions)   . PVD (peripheral vascular disease) ([facility])    Right ABI .75, Left .78 (2006)  . Syncope 06/2012   a. Felt to be postural syncope related to diuretics 06/2012.  Marland Kitchen Systolic and diastolic CHF, chronic (HCC) cardiologsit-  dr hochrein   a. NICM - patent cors 06/2012, EF 40% at that time. b. 03/2013 eval: EF 20-25%.  . Vision loss, left eye    due to glaucoma  . Wears glasses     Medications:  Medications Prior to Admission  Medication Sig Dispense Refill Last Dose  . albuterol (PROVENTIL) (2.5 MG/3ML) 0.083% nebulizer solution NEBULIZE 1 VIAL EVERY 6 HOURS AS NEEDED FOR WHEEZING OR SHORTNESS OF BREATH (Patient taking differently: Take 2.5 mg by nebulization every 6 (six) hours as needed for wheezing or shortness of breath. ) 180 mL 3 unknown  . aspirin EC 81 MG EC tablet Take 1 tablet (81 mg  total) by mouth daily.   12/20/2018 at Unknown time  . donepezil (ARICEPT) 10 MG tablet TAKE ONE TABLET AT BEDTIME (Patient taking differently: Take 10 mg by mouth at bedtime. ) 30 tablet 2 12/20/2018 at Unknown time  . febuxostat (ULORIC) 40 MG tablet TAKE 1 TABLET DAILY (Patient taking differently: Take 40 mg by mouth daily. ) 30 tablet 1 UNKNOWN  . fluticasone (FLONASE) 50 MCG/ACT nasal spray Place 1 spray into both nostrils daily as needed for allergies or rhinitis. (Patient taking differently: Place 1 spray into both nostrils at bedtime. ) 16 g 2 12/20/2018 at Unknown time  . furosemide (LASIX) 40 MG tablet Take 1 tablet (40 mg total) by mouth 2 (two) times daily. 60 tablet 0 12/20/2018 at Unknown time  . gabapentin (NEURONTIN) 100 MG capsule TAKE 2 CAPSULES TWICE A DAY (Patient taking differently: Take 200 mg by mouth 2  (two) times daily. ) 120 capsule 2 12/20/2018 at Unknown time  . isosorbide dinitrate (ISORDIL) 30 MG tablet Take 1 tablet (30 mg total) by mouth 2 (two) times daily. 50 tablet 1 12/20/2018 at Unknown time  . metoprolol succinate (TOPROL-XL) 50 MG 24 hr tablet Take 1 tablet (50 mg total) by mouth every morning. (Appt w/ PCP for blood pressure) 90 tablet 3 12/20/2018 at 1000  . MYRBETRIQ 25 MG TB24 tablet Take 25 mg by mouth every morning.    12/20/2018 at Unknown time  . pantoprazole (PROTONIX) 40 MG tablet TAKE 1 TABLET DAILY (Patient taking differently: Take 40 mg by mouth daily. ) 30 tablet 0 12/20/2018 at Unknown time  . sertraline (ZOLOFT) 50 MG tablet TAKE 1 TABLET IN THE MORNING (Patient taking differently: Take 50 mg by mouth daily. ) 30 tablet 0 12/20/2018 at Unknown time  . simvastatin (ZOCOR) 10 MG tablet Take 1 tablet (10 mg total) by mouth at bedtime. 90 tablet 1 12/20/2018 at Unknown time  . warfarin (COUMADIN) 2.5 MG tablet Take 1-2 tablets (2.5-5 mg total) by mouth See admin instructions. Take 2.5 mg by mouth daily except Mondays and Fridays take 5 mg 40 tablet 3 12/20/2018 at 1000  . ipratropium (ATROVENT) 0.02 % nebulizer solution Take 0.5 mg by nebulization every 6 (six) hours as needed for wheezing or shortness of breath.   unknown  . PROAIR HFA 108 (90 Base) MCG/ACT inhaler 2 PUFFS EVERY 6 HOURS AS NEEDED FOR WHEEZING OR SHORTNESS OF BREATH (Patient taking differently: Inhale 2 puffs into the lungs every 6 (six) hours as needed for wheezing or shortness of breath. ) 8.5 g 4 unknown    Assessment: Patient is on warfarin prior to admission for Atrial fibrillation with therapeutic INR at 2.1. Pt had elevated hs troponin (1,324>>1.350>>1,028). Only medical management planned for now due to patient's age and multiple comorbidities making him a poor cath candidate.  INR remains therapeutic at 2.0. Hg and Hct are wnls. Plt's are slightly low.   PTA dosing: 2.5 mg daily, except mon/fri take 5mg   Goal  of Therapy:  Heparin level 0.3-0.7 units/ml Monitor platelets by anticoagulation protocol: Yes   Plan:  Give warfarin 2.5mg  PO x1 tonight Daily INR, s/s bleeding  Sherren Kerns, PharmD PGY1 Acute Care Pharmacy Resident [phone] 12/23/2018 11:33 AM

## 2018-12-23 NOTE — Progress Notes (Signed)
  Progress Note    12/23/2018 1:20 PM * No surgery found *  Subjective: Patient denies any pain today  Vitals:   12/23/18 0750 12/23/18 1100  BP: (!) 139/95   Pulse: 65   Resp: (!) 22   Temp: 99.4 F (37.4 C)   SpO2: 100% 95%    Physical Exam: Awake and alert Nonlabored respirations Abdomen is soft Right lower extremity with stable nonpitting edema there is a dressing on superficial wound right anterior shin  CBC    Component Value Date/Time   WBC 11.4 (H) 12/23/2018 0638   RBC 4.83 12/23/2018 0638   HGB 13.5 12/23/2018 0638   HGB 13.9 12/20/2018 1117   HGB 11.5 (L) 01/13/2011 1349   HCT 42.2 12/23/2018 0638   HCT 42.3 12/20/2018 1117   HCT 33.7 (L) 01/13/2011 1349   PLT 127 (L) 12/23/2018 0638   PLT 194 12/20/2018 1117   MCV 87.4 12/23/2018 0638   MCV 85 12/20/2018 1117   MCV 94.3 01/13/2011 1349   MCH 28.0 12/23/2018 0638   MCHC 32.0 12/23/2018 0638   RDW 15.6 (H) 12/23/2018 0638   RDW 14.6 12/20/2018 1117   RDW 17.4 (H) 01/13/2011 1349   LYMPHSABS 1.0 12/21/2018 1209   LYMPHSABS 1.7 12/20/2018 1117   LYMPHSABS 0.5 (L) 01/13/2011 1349   MONOABS 0.8 12/21/2018 1209   MONOABS 0.4 01/13/2011 1349   EOSABS 0.0 12/21/2018 1209   EOSABS 0.1 12/20/2018 1117   BASOSABS 0.0 12/21/2018 1209   BASOSABS 0.0 12/20/2018 1117   BASOSABS 0.0 01/13/2011 1349    BMET    Component Value Date/Time   NA 137 12/23/2018 0638   NA 146 (H) 12/20/2018 1117   K 3.5 12/23/2018 0638   CL 101 12/23/2018 0638   CO2 25 12/23/2018 0638   GLUCOSE 127 (H) 12/23/2018 0638   BUN 31 (H) 12/23/2018 0638   BUN 32 12/20/2018 1117   CREATININE 1.92 (H) 12/23/2018 0638   CREATININE 0.91 11/02/2012 1310   CALCIUM 8.7 (L) 12/23/2018 0638   GFRNONAA 29 (L) 12/23/2018 0638   GFRNONAA 74 11/02/2012 1310   GFRAA 34 (L) 12/23/2018 0638   GFRAA 86 11/02/2012 1310    INR    Component Value Date/Time   INR 2.0 (H) 12/23/2018 [id number]   INR 2.6 (H) 09/07/2018 1100     Intake/Output  Summary (Last 24 hours) at 12/23/2018 1320 Last data filed at 12/23/2018 1300 Gross per 24 hour  Intake 1226.04 ml  Output 325 ml  Net 901.04 ml     Assessment/plan:  85 y.o. male is admitted after fall does have a wound on his shin that is superficial and has been present for several months.  Does not appear to have any pain or other ulceration of the right foot.  ABIs really quite stable over the past 4 years.  This time given his current admission with elevated troponins after fall as well as chronic kidney disease we will not pursue angiography.  We can see him in 1 to 2 months in the office if he is having issues we could consider CO2 angiogram at that time.  We will get a duplex of the right lower extremity prior to office visit.  If he has further issues while admitted please notify me and will reevaluate.    Aeon Kessner C. Donzetta Matters, MD [facility] Office: [phone] Pager: [phone]  12/23/2018 1:20 PM

## 2018-12-23 NOTE — Evaluation (Signed)
Physical Therapy Evaluation & Discharge Patient Details Name: Scott [name] MRN: [id number] DOB: 1921-08-26 Today's Date: 12/23/2018   History of Present Illness  Pt is a 85 y.o. male admitted 12/21/18 with confusion, found by family after fall at home. Pt with R foot pain; imaging negative for acute injury. Also with elevated troponin, now trending down; poor cath candidate. PMH includes arthritis, CAD, COPD, DDD, glaucoma, HTN, PAD, L AKA (2016), dementia.    Clinical Impression  Patient evaluated by Physical Therapy with no further acute PT needs identified. PTA, pt reliant on w/c for mobility, able to perform pivot transfers on RLE; reports having 24/7 assist from PCAs for ADL and household tasks. Today, pt able to transfer at [place]; reports minimal pain in R foot with weight bearing. All education has been completed and the patient has no further questions. Recommend follow-up with HHPT services. Acute PT is signing off. Thank you for this referral.    Follow Up Recommendations Home health PT;Supervision for mobility/OOB    Equipment Recommendations  None recommended by PT    Recommendations for Other Services       Precautions / Restrictions Precautions Precautions: Fall Precaution Comments: Previous L AKA Restrictions Weight Bearing Restrictions: No      Mobility  Bed Mobility Overal bed mobility: Modified Independent             General bed mobility comments: Increased time; able to come to sitting with HOB flat, no use of bed rail  Transfers Overall transfer level: Needs assistance Equipment used: None Transfers: Squat Pivot Transfers     Squat pivot transfers: Supervision     General transfer comment: Pt able to transfer to drop arm recliner at supervision-level, good ability to reach for arm rest and pivot on R foot  Ambulation/Gait                Stairs            Wheelchair Mobility    Modified Rankin (Stroke Patients Only)       Balance   Sitting-balance support: Feet supported;No upper extremity supported Sitting balance-Leahy Scale: Good       Standing balance-Leahy Scale: Poor Standing balance comment: Reliant on armrest of chair during transfer                             Pertinent Vitals/Pain Pain Assessment: Faces Faces Pain Scale: Hurts a little bit Pain Location: R foot with WB Pain Descriptors / Indicators: Discomfort Pain Intervention(s): Monitored during session    Home Living Family/patient expects to be discharged to:: Private residence Living Arrangements: Alone Available Help at Discharge: Personal care attendant;Available 24 hours/day Type of Home: House Home Access: Ramped entrance     Home Layout: One level Home Equipment: Wheelchair - power;Walker - 2 wheels;Shower seat Additional Comments: Pt reports PCA 24 hrs/day    Prior Function Level of Independence: Needs assistance   Gait / Transfers Assistance Needed: Non-ambulatory, uses power wheelchair for mobility, Mod Indep stand pivot transfers  ADL's / Homemaking Assistance Needed: assisted for bathing, dressing and household ADLs by paid attendants 24/7        Hand Dominance        Extremity/Trunk Assessment   Upper Extremity Assessment Upper Extremity Assessment: Overall WFL for tasks assessed    Lower Extremity Assessment Lower Extremity Assessment: Generalized weakness       Communication   Communication: HOH  Cognition Arousal/Alertness:  Awake/alert Behavior During Therapy: WFL for tasks assessed/performed Overall Cognitive Status: Within Functional Limits for tasks assessed                                 General Comments: WFL for simple tasks, answering all questions appropriately; likely baseline cognition      General Comments      Exercises     Assessment/Plan    PT Assessment All further PT needs can be met in the next venue of care  PT Problem List  Decreased strength;Decreased activity tolerance;Decreased balance;Decreased mobility       PT Treatment Interventions      PT Goals (Current goals can be found in the Care Plan section)  Acute Rehab PT Goals Patient Stated Goal: Home with continued PCA assist and HHPT services PT Goal Formulation: All assessment and education complete, DC therapy    Frequency     Barriers to discharge        Co-evaluation               AM-PAC PT "6 Clicks" Mobility  Outcome Measure Help needed turning from your back to your side while in a flat bed without using bedrails?: None Help needed moving from lying on your back to sitting on the side of a flat bed without using bedrails?: None Help needed moving to and from a bed to a chair (including a wheelchair)?: None Help needed standing up from a chair using your arms (e.g., wheelchair or bedside chair)?: None Help needed to walk in hospital room?: Total Help needed climbing 3-5 steps with a railing? : Total 6 Click Score: 18    End of Session   Activity Tolerance: Patient tolerated treatment well Patient left: in chair;with call bell/phone within reach Nurse Communication: Mobility status PT Visit Diagnosis: Unsteadiness on feet (R26.81);Other abnormalities of gait and mobility (R26.89);Muscle weakness (generalized) (M62.81)    Time: 5852-7782 PT Time Calculation (min) (ACUTE ONLY): 16 min   Charges:   PT Evaluation $PT Eval Moderate Complexity: [place], PT, DPT Acute Rehabilitation Services  Pager [phone] Office [phone]  Derry Lory 12/23/2018, 2:04 PM

## 2018-12-24 DIAGNOSIS — E7800 Pure hypercholesterolemia, unspecified: Secondary | ICD-10-CM

## 2018-12-24 DIAGNOSIS — N179 Acute kidney failure, unspecified: Secondary | ICD-10-CM

## 2018-12-24 DIAGNOSIS — I2510 Atherosclerotic heart disease of native coronary artery without angina pectoris: Secondary | ICD-10-CM

## 2018-12-24 DIAGNOSIS — N189 Chronic kidney disease, unspecified: Secondary | ICD-10-CM

## 2018-12-24 DIAGNOSIS — I214 Non-ST elevation (NSTEMI) myocardial infarction: Secondary | ICD-10-CM

## 2018-12-24 LAB — BASIC METABOLIC PANEL
Anion gap: 11 (ref 5–15)
BUN: 29 mg/dL — ABNORMAL HIGH (ref 8–23)
CO2: 28 mmol/L (ref 22–32)
Calcium: 8.9 mg/dL (ref 8.9–10.3)
Chloride: 100 mmol/L (ref 98–111)
Creatinine, Ser: 1.82 mg/dL — ABNORMAL HIGH (ref 0.61–1.24)
GFR calc Af Amer: 36 mL/min — ABNORMAL LOW (ref >60)
GFR calc non Af Amer: 31 mL/min — ABNORMAL LOW (ref >60)
Glucose, Bld: 97 mg/dL (ref 70–99)
Potassium: 3.4 mmol/L — ABNORMAL LOW (ref 3.5–5.1)
Sodium: 139 mmol/L (ref 135–145)

## 2018-12-24 LAB — PROTIME-INR
INR: 2.4 — ABNORMAL HIGH (ref 0.8–1.2)
Prothrombin Time: 25.8 seconds — ABNORMAL HIGH (ref 11.4–15.2)

## 2018-12-24 LAB — CBC
HCT: 43.4 % (ref 39.0–52.0)
Hemoglobin: 13.9 g/dL (ref 13.0–17.0)
MCH: 27.3 pg (ref 26.0–34.0)
MCHC: 32 g/dL (ref 30.0–36.0)
MCV: 85.1 fL (ref 80.0–100.0)
Platelets: 136 10*3/uL — ABNORMAL LOW (ref 150–400)
RBC: 5.1 MIL/uL (ref 4.22–5.81)
RDW: 15.4 % (ref 11.5–15.5)
WBC: 7.8 10*3/uL (ref 4.0–10.5)
nRBC: 0 % (ref 0.0–0.2)

## 2018-12-24 LAB — PROCALCITONIN: Procalcitonin: 0.85 ng/mL

## 2018-12-24 MED ORDER — HYDRALAZINE HCL 25 MG PO TABS
25.0000 mg | ORAL_TABLET | Freq: Three times a day (TID) | ORAL | Status: DC
  Administered 2018-12-24 – 2018-12-25 (×3): 25 mg via ORAL
  Filled 2018-12-24 (×3): qty 1

## 2018-12-24 MED ORDER — POTASSIUM CHLORIDE CRYS ER 20 MEQ PO TBCR
40.0000 meq | EXTENDED_RELEASE_TABLET | Freq: Once | ORAL | Status: AC
  Administered 2018-12-24: 40 meq via ORAL
  Filled 2018-12-24: qty 2

## 2018-12-24 MED ORDER — WARFARIN SODIUM 2.5 MG PO TABS
2.5000 mg | ORAL_TABLET | Freq: Once | ORAL | Status: AC
  Administered 2018-12-24: 2.5 mg via ORAL
  Filled 2018-12-24: qty 1

## 2018-12-24 MED ORDER — METHYLPREDNISOLONE SODIUM SUCC 125 MG IJ SOLR
80.0000 mg | Freq: Once | INTRAMUSCULAR | Status: AC
  Administered 2018-12-24: 80 mg via INTRAVENOUS
  Filled 2018-12-24: qty 2

## 2018-12-24 MED ORDER — COLCHICINE 0.6 MG PO TABS
0.6000 mg | ORAL_TABLET | Freq: Once | ORAL | Status: AC
  Administered 2018-12-24: 0.6 mg via ORAL
  Filled 2018-12-24: qty 1

## 2018-12-24 NOTE — Progress Notes (Signed)
Patient resting comfortably during shift report. Denies complaints.  

## 2018-12-24 NOTE — Progress Notes (Addendum)
Progress Note  Patient Name: Scott [name] Date of Encounter: 12/24/2018  Primary Cardiologist: Minus Breeding, MD   Subjective   Denies any CP or SOB.  Complains of RLE pain  Inpatient Medications    Scheduled Meds: . aspirin EC  81 mg Oral Daily  . carvedilol  3.125 mg Oral BID WC  . donepezil  10 mg Oral QHS  . fluticasone  1 spray Each Nare QHS  . hydrALAZINE  10 mg Oral Q8H  . influenza vaccine adjuvanted  0.5 mL Intramuscular Tomorrow-1000  . isosorbide dinitrate  30 mg Oral BID  . mirabegron ER  25 mg Oral q morning - 10a  . potassium chloride  40 mEq Oral Once  . sertraline  50 mg Oral q morning - 10a  . simvastatin  10 mg Oral QHS  . Warfarin - Pharmacist Dosing Inpatient   Does not apply q1800   Continuous Infusions: . sodium chloride 250 mL (12/22/18 1614)  . cefTRIAXone (ROCEPHIN)  IV Stopped (12/23/18 1421)  . vancomycin 1,000 mg (12/23/18 0459)   PRN Meds: sodium chloride, acetaminophen **OR** acetaminophen, ipratropium-albuterol, ondansetron **OR** ondansetron (ZOFRAN) IV   Vital Signs    Vitals:   12/23/18 1620 12/23/18 1948 12/23/18 2233 12/24/18 0439  BP: (!) 151/87 (!) 143/70 135/73 (!) 158/91  Pulse: 85 78 85 84  Resp: 18 20  18   Temp: 98.8 F (37.1 C) 98.1 F (36.7 C)  99.7 F (37.6 C)  TempSrc: Oral Oral  Oral  SpO2: 93% 93% 96% 92%  Weight:    72.5 kg  Height:        Intake/Output Summary (Last 24 hours) at 12/24/2018 0837 Last data filed at 12/24/2018 0750 Gross per 24 hour  Intake 924 ml  Output 750 ml  Net 174 ml   Last 3 Weights 12/24/2018 12/23/2018 12/22/2018  Weight (lbs) 159 lb 13.3 oz 158 lb 8.2 oz 158 lb 12.8 oz  Weight (kg) 72.5 kg 71.9 kg 72.031 kg      Telemetry    NSR with occasional PVCs- Personally Reviewed  ECG    No new EKG to review Personally Reviewed  Physical Exam   GEN: Well nourished, well developed in no acute distress HEENT: Normal NECK: No JVD; No carotid bruits LYMPHATICS: No lymphadenopathy  CARDIAC:RRR, no murmurs, rubs, gallops RESPIRATORY:  Clear to auscultation without rales, wheezing or rhonchi  ABDOMEN: Soft, non-tender, non-distended MUSCULOSKELETAL:  1+ RLE edema; No deformity  SKIN: Warm and dry NEUROLOGIC:  Alert and oriented x 3 PSYCHIATRIC:  Normal affect    Labs    High Sensitivity Troponin:   Recent Labs  Lab 12/10/18 1249 12/10/18 1429 12/21/18 1209 12/21/18 1422 12/22/18 0837  TROPONINIHS 29* 28* 1,324* 1,350* 1,028*      Chemistry Recent Labs  Lab 12/20/18 1117  12/21/18 1209 12/22/18 0123 12/23/18 0638 12/24/18 0452  NA 146*   < > 141 140 137 139  K 3.8  --  3.7 3.6 3.5 3.4*  CL 102  --  100 103 101 100  CO2 29  --  31 29 25 28   GLUCOSE 75   < > 89 123* 127* 97  BUN 32   < > 35* 31* 31* 29*  CREATININE 1.43*  --  1.69* 1.59* 1.92* 1.82*  CALCIUM 9.1  --  8.9 8.7* 8.7* 8.9  PROT 5.8*  --  6.5 5.8*  --   --   ALBUMIN 3.9  --  3.6 2.9*  --   --  AST 15  --  18 21  --   --   ALT 21  --  20 17  --   --   ALKPHOS 153*  --  122 107  --   --   BILITOT 0.3  --  1.2 1.0  --   --   GFRNONAA 41*  --  34* 36* 29* 31*  GFRAA 48*  --  39* 42* 34* 36*  ANIONGAP  --    < > 10 8 11 11    < > = values in this interval not displayed.     Hematology Recent Labs  Lab 12/22/18 0123 12/23/18 0638 12/24/18 0452  WBC 10.7* 11.4* 7.8  RBC 4.89 4.83 5.10  HGB 13.4 13.5 13.9  HCT 42.3 42.2 43.4  MCV 86.5 87.4 85.1  MCH 27.4 28.0 27.3  MCHC 31.7 32.0 32.0  RDW 15.7* 15.6* 15.4  PLT 139* 127* 136*    BNPNo results for input(s): BNP, PROBNP in the last 168 hours.   DDimer No results for input(s): DDIMER in the last 168 hours.   Radiology    No results found.  Cardiac Studies   2D Echo 12/11/18   1. The left ventricle has moderately reduced systolic function, with an ejection fraction of 35- 40%. The cavity size was normal. There is mildly increased left ventricular wall thickness. Left ventricular diastolic Doppler parameters are  consistent with impaired relaxation. Elevated left ventricular end-diastolic pressure. 2. There is akinesis of the left ventricular, apical anteroseptal wall. 3. There is akinesis of the left ventricular, basal-mid inferior wall. 4. The right ventricle has moderately reduced systolic function. The cavity was normal. There is no increase in right ventricular wall thickness. Right ventricular systolic pressure is severely elevated with an estimated pressure of 70.2 mmHg. 5. Left atrial size was mildly dilated. 6. The aortic valve is tricuspid. Moderate calcification of the aortic valve. Moderate aortic annular calcification noted. 7. The mitral valve is degenerative. Mild thickening of the mitral valve leaflet. Moderate calcification of the mitral valve leaflet. There is moderate mitral annular calcification present. 8. The tricuspid valve is grossly normal. 9. The aorta is normal unless otherwise noted.   Patient Profile     Scott [name] is a 85 y.o. male with a hx of NICM, PAF, h/o DVT, chronic anticoagulation w/ coumadin, chronic LBBB, HTN, h/o postural syncope related to diuretics, stage III CKD, ETOH abuse, bladder CA and PAD who is being seen today for the evaluation of elevated troponin (primary presentation was for foot pain). Initially seen in consultation by Dr. Harl Bowie at [facility] 9/10. Transferred to [facility] for further care.   Assessment & Plan    1. Elevated hs Troponin: significant increase from baseline (28>>29 last month), now 1,324>>1.350>>1,028. No recent anginal symptomatology. Recent echo showed EF 35-40%. EF unchanged from prior study in 2018.  Continues to deny CP  Given age, multiple cormorbidities including CKD, he is an overall poor cath candidate, particulary in the absence of symptoms or clinical instability.  Troponins appear to be down trending  Continue medical management with ASA, Imdur and statin and low dose BB.  2. Chronic Systolic HF/ NICM: LHC in 2014 w/  normal coronaries. EF 35-40% on recent echo, c/w prior studies. Admitted 2 weeks ago with COPD exacerbation and pulmonary edema due to acute on chronic systolic HF. Discharge weight 152 lbs after diuresising 4.2 L.   He denies any SOB this am and lungs are clear on exam.  He  has RLE edema but likely related to cellulitis.  He was on Lasix 40mg  PO BID but held due to bump in creatinine  He put out 1L yesterday and is net positive 205cc.  Creatinine down to 1.82 today  Weight up 1lb from yesterday  Continue to hold further diuretics for now  Continue carvedilol 3.125mg  BID and Imdur 60mg  daily and increase Hydralazine 25mg  TID  Plan to consolidate to Bidil at discharge  3. Fever/leukocytosis/LE cellulitis:   due to LE cellulitis with sepsis physiology   On broad spectrum antibiotics, Vanc + rocephin   4. Claudication: history of PAD, prior AKA. Per Cardiology consult note 9/10, ABIs in ER at [facility] with significant right sided disease.   Appreciate Vascular consult   5. Acute on Chronic Kidney Disease:   Stage III CKD at baseline. Baseline SCr ~1.2-1.4.   Creatinine decreased today from 1.92>>1.82.  Continue to hole diuretics  Repeat BMET in am  6. HTN:  BP elevated this am at 158/53mmHg  Started carvedilol yesterday low dose  Increase Hydralazine to 25mg  TID and continue Imdur  7. PAF:   NSR on tele  INR 2.4 today  Continue warfarin and BB  8. H/o DVT:   INR therapeutic at 2.4 today  Continue warfarin  9. Bradycardia:   He had been on Toprol XL 50mg  at home and this was stopped due to bracydardia  Started on Carvedilol 3.125mg  BID yesterday for HF and HR in the 70's today   For questions or updates, please contact [place] Please consult [email or web address] for contact info under      Signed, Fransico Him, MD  12/24/2018, 8:37 AM

## 2018-12-24 NOTE — Progress Notes (Signed)
ANTICOAGULATION CONSULT NOTE - Follow up [place] for heparin Indication: Atrial Fibrillation  Allergies  Allergen Reactions  . Lipitor [Atorvastatin] Other (See Comments)    Unknown rxn per pt    Vital Signs: Temp: 99.2 F (37.3 C) (09/13 1215) Temp Source: Oral (09/13 1215) BP: 111/86 (09/13 1215) Pulse Rate: 77 (09/13 1215)  Labs: Recent Labs    12/21/18 1422  12/22/18 0123 12/22/18 0837 12/23/18 0638 12/24/18 0452  HGB  --    < > 13.4  --  13.5 13.9  HCT  --   --  42.3  --  42.2 43.4  PLT  --   --  139*  --  127* 136*  LABPROT  --   --  22.2*  --  22.3* 25.8*  INR  --   --  2.0*  --  2.0* 2.4*  CREATININE  --   --  1.59*  --  1.92* 1.82*  TROPONINIHS 1,350*  --   --  1,028*  --   --    < > = values in this interval not displayed.    Estimated Creatinine Clearance: 21.9 mL/min (A) (by C-G formula based on SCr of 1.82 mg/dL (H)).   Medical History: Past Medical History:  Diagnosis Date  . Arthritis    "all over"  . Atrial fibrillation ([place])   . Benign localized prostatic hyperplasia with lower urinary tract symptoms (LUTS)   . Bradycardia 11/28/2015   Severe-HR in the 20s to 30s following intubation for urological procedure.  Marland Kitchen CAD- non obstructive disease by cath 3/14 cardiologist-  dr hochrein  . Chronic lower back pain   . COPD (chronic obstructive pulmonary disease) ([place])   . DDD (degenerative disc disease)   . Depression   . Dyspnea    w/ exertion and lying down (raise head)  . ETOH abuse   . GERD (gastroesophageal reflux disease)   . Glaucoma   . Glaucoma, both eyes   . History of bladder cancer urologist-- dr Jeffie Pollock   first dx 2012--  s/p TURBT's  . History of DVT of lower extremity 03/2013   bilateral   . History of pulmonary embolus (PE) 04/2013  . HTN (hypertension)   . Hyperlipidemia   . Hypertension   . LBBB (left bundle branch block)    chronic  . Non-ischemic cardiomyopathy ([place])    last echo 01-18-2017, ef 35-40%   . NSVT (nonsustained ventricular tachycardia) ([place])    a. NSVT 06/2012; NSVT also seen during 03/2013 adm. b. Med rx. Not candidate for ICD given adv age.  Marland Kitchen PAD (peripheral artery disease) ([place])   . 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 PVCs (premature ventricular contractions)   . PVD (peripheral vascular disease) ([facility])    Right ABI .75, Left .78 (2006)  . Syncope 06/2012   a. Felt to be postural syncope related to diuretics 06/2012.  Marland Kitchen Systolic and diastolic CHF, chronic (HCC) cardiologsit-  dr hochrein   a. NICM - patent cors 06/2012, EF 40% at that time. b. 03/2013 eval: EF 20-25%.  . Vision loss, left eye    due to glaucoma  . Wears glasses     Medications:  Medications Prior to Admission  Medication Sig Dispense Refill Last Dose  . albuterol (PROVENTIL) (2.5 MG/3ML) 0.083% nebulizer solution NEBULIZE 1 VIAL EVERY 6 HOURS AS NEEDED FOR WHEEZING OR SHORTNESS OF BREATH (Patient taking  differently: Take 2.5 mg by nebulization every 6 (six) hours as needed for wheezing or shortness of breath. ) 180 mL 3 unknown  . aspirin EC 81 MG EC tablet Take 1 tablet (81 mg total) by mouth daily.   12/20/2018 at Unknown time  . donepezil (ARICEPT) 10 MG tablet TAKE ONE TABLET AT BEDTIME (Patient taking differently: Take 10 mg by mouth at bedtime. ) 30 tablet 2 12/20/2018 at Unknown time  . febuxostat (ULORIC) 40 MG tablet TAKE 1 TABLET DAILY (Patient taking differently: Take 40 mg by mouth daily. ) 30 tablet 1 UNKNOWN  . fluticasone (FLONASE) 50 MCG/ACT nasal spray Place 1 spray into both nostrils daily as needed for allergies or rhinitis. (Patient taking differently: Place 1 spray into both nostrils at bedtime. ) 16 g 2 12/20/2018 at Unknown time  . furosemide (LASIX) 40 MG tablet Take 1 tablet (40 mg total) by mouth 2 (two) times daily. 60 tablet 0 12/20/2018 at Unknown time  . gabapentin (NEURONTIN) 100 MG  capsule TAKE 2 CAPSULES TWICE A DAY (Patient taking differently: Take 200 mg by mouth 2 (two) times daily. ) 120 capsule 2 12/20/2018 at Unknown time  . isosorbide dinitrate (ISORDIL) 30 MG tablet Take 1 tablet (30 mg total) by mouth 2 (two) times daily. 50 tablet 1 12/20/2018 at Unknown time  . metoprolol succinate (TOPROL-XL) 50 MG 24 hr tablet Take 1 tablet (50 mg total) by mouth every morning. (Appt w/ PCP for blood pressure) 90 tablet 3 12/20/2018 at 1000  . MYRBETRIQ 25 MG TB24 tablet Take 25 mg by mouth every morning.    12/20/2018 at Unknown time  . pantoprazole (PROTONIX) 40 MG tablet TAKE 1 TABLET DAILY (Patient taking differently: Take 40 mg by mouth daily. ) 30 tablet 0 12/20/2018 at Unknown time  . sertraline (ZOLOFT) 50 MG tablet TAKE 1 TABLET IN THE MORNING (Patient taking differently: Take 50 mg by mouth daily. ) 30 tablet 0 12/20/2018 at Unknown time  . simvastatin (ZOCOR) 10 MG tablet Take 1 tablet (10 mg total) by mouth at bedtime. 90 tablet 1 12/20/2018 at Unknown time  . warfarin (COUMADIN) 2.5 MG tablet Take 1-2 tablets (2.5-5 mg total) by mouth See admin instructions. Take 2.5 mg by mouth daily except Mondays and Fridays take 5 mg 40 tablet 3 12/20/2018 at 1000  . ipratropium (ATROVENT) 0.02 % nebulizer solution Take 0.5 mg by nebulization every 6 (six) hours as needed for wheezing or shortness of breath.   unknown  . PROAIR HFA 108 (90 Base) MCG/ACT inhaler 2 PUFFS EVERY 6 HOURS AS NEEDED FOR WHEEZING OR SHORTNESS OF BREATH (Patient taking differently: Inhale 2 puffs into the lungs every 6 (six) hours as needed for wheezing or shortness of breath. ) 8.5 g 4 unknown    Assessment: Patient is on warfarin prior to admission for Atrial fibrillation with therapeutic INR at 2.1. Pt had elevated hs troponin (1,324>>1.350>>1,028). Only medical management planned for now due to patient's age and multiple comorbidities making him a poor cath candidate.  INR remains therapeutic at 2.4. Hg and Hct are  wnls. Plt's are slightly low but trending up  PTA dosing: 2.5 mg daily, except mon/fri take 5mg   Goal of Therapy:  Heparin level 0.3-0.7 units/ml Monitor platelets by anticoagulation protocol: Yes   Plan:  Give warfarin 2.5mg  PO x1 tonight Daily INR, s/s bleeding  Sherren Kerns, PharmD PGY1 Acute Care Pharmacy Resident [phone] 12/24/2018 1:20 PM

## 2018-12-24 NOTE — Plan of Care (Signed)

## 2018-12-24 NOTE — Progress Notes (Signed)
Pharmacy Antibiotic Note  Scott [name] is a 85 y.o. male admitted on 12/21/2018 with cellulitis. Pharmacy has been consulted for Vancomycin dosing.  Pt's WBC is trending down from 11.4>>7.8. PCT is decreasing from 1.48>>0.85 (likely a bit elevated due to renal dysfunction). Pt since experience some low grade fevers, but improving. Renal function is improving Scr. 1.91>>1.82 with increase in urine output (0.2 mL/kg/hr >>0.6 mL/kg/hr)  Plan: - Vancomycin 1000 mg IV Q36 hrs. Goal AUC 400-550. - Expected AUC: ~550 - expect AUC to become lower as renal function is improving. SCr used: 1.82 - Vancomycin still not at steady state, so will not order levels. However, may need to order levels in 2-3 days. - Continue Ceftriaxone IV at 1g q24 per MD -Monitor clinical status, WBC, Fever, Renal function, and LOT   Height: 5\' 6"  (167.6 cm) Weight: 159 lb 13.3 oz (72.5 kg) IBW/kg (Calculated) : 63.8  Temp (24hrs), Avg:99 F (37.2 C), Min:98.1 F (36.7 C), Max:99.7 F (37.6 C)  Recent Labs  Lab 12/20/18 1117 12/21/18 1209 12/21/18 1422 12/22/18 0123 12/22/18 0232 12/23/18 0638 12/24/18 0452  WBC 7.9 15.0*  --  10.7*  --  11.4* 7.8  CREATININE 1.43* 1.69*  --  1.59*  --  1.92* 1.82*  LATICACIDVEN  --  1.7 2.2* 1.3 1.4  --   --     Estimated Creatinine Clearance: 21.9 mL/min (A) (by C-G formula based on SCr of 1.82 mg/dL (H)).    Allergies  Allergen Reactions  . Lipitor [Atorvastatin] Other (See Comments)    Unknown rxn per pt    Antimicrobials this admission: Vanco 9/10 >>  CTX 9/10 >>   Microbiology results: 9/10 BCx: NGTD x 2 9/10 COVID: Negatvie   Thank you for allowing pharmacy to be a part of this patient's care.  Werner Lean 12/24/2018 1:23 PM

## 2018-12-24 NOTE — Progress Notes (Signed)
Patient's son is at bedside, provided update on fathers condition.

## 2018-12-24 NOTE — Progress Notes (Signed)
PROGRESS NOTE                                                                                                                                                                                                             Patient Demographics:    Scott [name], is a 85 y.o. male, DOB - [birth year], ES:[id number]  Admit date - 12/21/2018   Admitting Physician Costin Karlyne Greenspan, MD  Outpatient Primary MD for the patient is Dettinger, Fransisca Kaufmann, MD  LOS - 3   Chief Complaint  Patient presents with   Fall       Brief Narrative    85 y.o. 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 fib, hypertension, chronic kidney disease stage III, COPD, bladder cancer, CAD, left AKA who is being brought to the hospital by family due to mild confusion this morning.  They also report that last night, patient was found down caught in between the bed and the wall.  Patient denies any syncopal episodes and tells me he lost balance and fell.  He has a history of AKA but uses his right foot to pivot.  He denies any chest pain, denies any abdominal pain, nausea or vomiting.  He denies any fever or chills.  He does complain of right foot pain when he touch it but not at rest.  He is alert and oriented x4 however not forthcoming with answering questions.  The nephew, who is the main caregiver is at bedside and contributed to the [name].  He was recently admitted to the hospital and discharge 10 days ago due to COPD exacerbation and pulmonary edema.  ED Course: In the emergency room patient is found to have a temp of 100.4, slightly tachypneic, the rest of the vital signs are stable. His blood work shows a creatinine of 1.69, high-sensitivity troponin 1324, lactic acid initially 1.7 on repeat 2.2.  He has a white count of 15.  Covid was negative.  CT head and C-spine are negative.  Ankle x-ray negative. Chest x-ray without acute  abnormalities, mild cardiomegaly and chronic interstitial lung changes.  He had an ABI given poor pulses on the right lower extremity which showed severe progressive arterial occlusive disease at rest.  Dr. Carlis Abbott with vascular surgery and Dr. Harl Bowie with cardiology were consulted.  Patient will be admitted to [facility] for vascular surgery to be able to consult on him.    Subjective:    [name] today complains of right ankle pain, remains with low-grade temperature, denies chest pain, nausea or vomiting.    Assessment  & Plan :    Principal Problem:   Foot pain Active Problems:   Hyperlipidemia   CAD- non obstructive disease by cath 3/14   Bladder cancer (HCC)   Chronic combined systolic and diastolic heart failure (HCC)   NICM (nonischemic cardiomyopathy)- EF 20-25% by Echo 04/12/13   BPH (benign prostatic hyperplasia)   NSTEMI (non-ST elevated myocardial infarction) ([place])   PAD (peripheral artery disease) (HCC)   Essential hypertension   Chronic kidney disease (CKD) stage G3a/A1, moderately decreased glomerular filtration rate (GFR) between 45-59 mL/min/1.73 square meter and albuminuria creatinine ratio less than 30 mg/g (HCC)   Paroxysmal atrial fibrillation (HCC)   Alzheimer disease (HCC)   Cellulitis  Elevated troponin in the setting of known coronary artery disease - significant increase from baseline (28>>29 last month), now 1,324>>1.350>>1,028, finding concerning for NSTEMI -Cardiology input greatly appreciated, given his age, multiple comorbidities, CKD, and overall poor health, he is currently a poor cath candidate, particularly in the absence of chest pain. -Continue with aspirin, statin, Imdur, started on Coreg, as well he remains on warfarin  Right foot pain/PVD with cellulitis. -Patient currently on broad-spectrum antibiotics with vancomycin and ceftriaxone.  He is with low-grade temperature 99.7. -Procalcitonin trending down which is reassuring. -Leukocytosis has  normalized . -He complains of right ankle pain today, warm and tender, he is unsure if he ever had any history of gout, will give 1 dose of IV Solu-Medrol, and colchicine to see if it helps .  AKI on Chronic kidney disease stage III -Baseline creatinine between 1.2 and 1.5, continued has increased to 1.9 today, diuresis has been stopped, continue to monitor closely .  COPD -Recently admitted for COPD exacerbation, currently has no wheezing, stable, continue home medications.  Paroxysmal A. Fib -currently in sinus,  -on warfarin, pharmacy to dose   Chronic combined systolic and diastolic CHF -Most recent EF 35-40% just the end of August 2020.  Appears euvolemic.  Given fever/cellulitis along with mildly elevated lactic acid and creatinine. -Resume back on beta-blockers given bradycardia resolved, on Coreg 3.125 mg twice daily, he is currently on Imdur, started on BiDil as well  Hypertension -Please see above discussion  History of DVT/PE -On Coumadin at home  Dementia without behavioral disturbances -Continue Aricept, however patient alert and oriented x4 on my evaluation  Bladder cancer -Outpatient follow-up   Code Status : DNR  Family Communication  : Discussed with daughter-in-law Dr. Wynetta Emery via phone 9/12  Disposition Plan  : Home once stable  Barriers For Discharge : remains on IV antibiotics  Consults  : Cardiology, vascular surgery  Procedures  : None  DVT Prophylaxis  : Warfarin  Lab Results  Component Value Date   PLT 136 (L) 12/24/2018    Antibiotics  :    Anti-infectives (From admission, onward)   Start     Dose/Rate Route Frequency Ordered Stop   12/23/18 0500  vancomycin (VANCOCIN) IVPB 1000 mg/200 mL premix     1,000 mg 200 mL/hr over 60 Minutes Intravenous Every 36 hours 12/22/18 1109     12/22/18 1600  vancomycin (VANCOCIN) 500 mg in sodium chloride 0.9 % 100 mL IVPB  Status:  Discontinued     500 mg 100 mL/hr  over 60 Minutes Intravenous  Every 24 hours 12/21/18 1518 12/22/18 1109   12/22/18 1500  cefTRIAXone (ROCEPHIN) 1 g in sodium chloride 0.9 % 100 mL IVPB     1 g 200 mL/hr over 30 Minutes Intravenous Every 24 hours 12/21/18 2139     12/21/18 1630  vancomycin (VANCOCIN) IVPB 750 mg/150 ml premix     750 mg 150 mL/hr over 60 Minutes Intravenous  Once 12/21/18 1515 12/21/18 1823   12/21/18 1530  vancomycin (VANCOCIN) IVPB 750 mg/150 ml premix     750 mg 150 mL/hr over 60 Minutes Intravenous  Once 12/21/18 1515 12/21/18 1720   12/21/18 1500  vancomycin (VANCOCIN) IVPB 1000 mg/200 mL premix  Status:  Discontinued     1,000 mg 200 mL/hr over 60 Minutes Intravenous  Once 12/21/18 1446 12/21/18 1515   12/21/18 1500  cefTRIAXone (ROCEPHIN) 2 g in sodium chloride 0.9 % 100 mL IVPB     2 g 200 mL/hr over 30 Minutes Intravenous  Once 12/21/18 1446 12/21/18 1550        Objective:   Vitals:   12/23/18 2233 12/24/18 0439 12/24/18 0845 12/24/18 1215  BP: 135/73 (!) 158/91 (!) 140/95 111/86  Pulse: 85 84 (!) 105 77  Resp:  18  (!) 22  Temp:  99.7 F (37.6 C)  99.2 F (37.3 C)  TempSrc:  Oral  Oral  SpO2: 96% 92%  93%  Weight:  72.5 kg    Height:        Wt Readings from Last 3 Encounters:  12/24/18 72.5 kg  12/12/18 68.9 kg  10/20/18 72.5 kg     Intake/Output Summary (Last 24 hours) at 12/24/2018 1408 Last data filed at 12/24/2018 0848 Gross per 24 hour  Intake 567 ml  Output 750 ml  Net -183 ml     Physical Exam  Awake Alert, Oriented X 3, No new F.N deficits, Normal affect Symmetrical Chest wall movement, Good air movement bilaterally, CTAB RRR,No Gallops,Rubs or new Murmurs, No Parasternal Heave +ve B.Sounds, Abd Soft, No tenderness, No rebound - guarding or rigidity. No Cyanosis, Clubbing or edema, No new Rash or bruise  ,But has some erythema tenderness to palpation in the right ankle area,  left lower extremity AKA   Data Review:    CBC Recent Labs  Lab 12/20/18 1117 12/21/18 1209  12/22/18 0123 12/23/18 0638 12/24/18 0452  WBC 7.9 15.0* 10.7* 11.4* 7.8  HGB 13.9 14.5 13.4 13.5 13.9  HCT 42.3 46.1 42.3 42.2 43.4  PLT 194 163 139* 127* 136*  MCV 85 87.6 86.5 87.4 85.1  MCH 27.9 27.6 27.4 28.0 27.3  MCHC 32.9 31.5 31.7 32.0 32.0  RDW 14.6 16.0* 15.7* 15.6* 15.4  LYMPHSABS 1.7 1.0  --   --   --   MONOABS  --  0.8  --   --   --   EOSABS 0.1 0.0  --   --   --   BASOSABS 0.0 0.0  --   --   --     Chemistries  Recent Labs  Lab 12/20/18 1117 12/21/18 1209 12/22/18 0123 12/23/18 0638 12/24/18 0452  NA 146* 141 140 137 139  K 3.8 3.7 3.6 3.5 3.4*  CL 102 100 103 101 100  CO2 29 31 29 25 28   GLUCOSE 75 89 123* 127* 97  BUN 32 35* 31* 31* 29*  CREATININE 1.43* 1.69* 1.59* 1.92* 1.82*  CALCIUM 9.1 8.9 8.7* 8.7* 8.9  AST 15 18 21   --   --  ALT 21 20 17   --   --   ALKPHOS 153* 122 107  --   --   BILITOT 0.3 1.2 1.0  --   --    ------------------------------------------------------------------------------------------------------------------ No results for input(s): CHOL, HDL, LDLCALC, TRIG, CHOLHDL, LDLDIRECT in the last 72 hours.  Lab Results  Component Value Date   HGBA1C 5.8 (H) 11/28/2015   ------------------------------------------------------------------------------------------------------------------ No results for input(s): TSH, T4TOTAL, T3FREE, THYROIDAB in the last 72 hours.  Invalid input(s): FREET3 ------------------------------------------------------------------------------------------------------------------ No results for input(s): VITAMINB12, FOLATE, FERRITIN, TIBC, IRON, RETICCTPCT in the last 72 hours.  Coagulation profile Recent Labs  Lab 12/21/18 1209 12/22/18 0123 12/23/18 [id number] 12/24/18 0452  INR 2.1* 2.0* 2.0* 2.4*    No results for input(s): DDIMER in the last 72 hours.  Cardiac Enzymes No results for input(s): CKMB, TROPONINI, MYOGLOBIN in the last 168 hours.  Invalid input(s):  CK ------------------------------------------------------------------------------------------------------------------    Component Value Date/Time   BNP 1,713.0 (H) 12/10/2018 1251    Inpatient Medications  Scheduled Meds:  aspirin EC  81 mg Oral Daily   carvedilol  3.125 mg Oral BID WC   donepezil  10 mg Oral QHS   fluticasone  1 spray Each Nare QHS   hydrALAZINE  25 mg Oral Q8H   influenza vaccine adjuvanted  0.5 mL Intramuscular Tomorrow-1000   isosorbide dinitrate  30 mg Oral BID   mirabegron ER  25 mg Oral q morning - 10a   sertraline  50 mg Oral q morning - 10a   simvastatin  10 mg Oral QHS   warfarin  2.5 mg Oral ONCE-1800   Warfarin - Pharmacist Dosing Inpatient   Does not apply q1800   Continuous Infusions:  sodium chloride 250 mL (12/22/18 1614)   cefTRIAXone (ROCEPHIN)  IV Stopped (12/23/18 1421)   vancomycin 1,000 mg (12/23/18 0459)   PRN Meds:.sodium chloride, acetaminophen **OR** acetaminophen, ipratropium-albuterol, ondansetron **OR** ondansetron (ZOFRAN) IV  Micro Results Recent Results (from the past 240 hour(s))  Culture, blood (routine x 2)     Status: None (Preliminary result)   Collection Time: 12/21/18 12:22 PM   Specimen: BLOOD RIGHT ARM  Result Value Ref Range Status   Specimen Description BLOOD RIGHT ARM DRAWN BY RN  Final   Special Requests   Final    BOTTLES DRAWN AEROBIC AND ANAEROBIC Blood Culture adequate volume   Culture   Final    NO GROWTH 2 DAYS Performed at [facility], [address] [place], [place] [address]    Report Status PENDING  Incomplete  SARS Coronavirus 2 [facility] order, Performed in [place] hospital lab) Nasopharyngeal Nasopharyngeal Swab     Status: None   Collection Time: 12/21/18  1:39 PM   Specimen: Nasopharyngeal Swab  Result Value Ref Range Status   SARS Coronavirus 2 NEGATIVE NEGATIVE Final    Comment: (NOTE) If result is NEGATIVE SARS-CoV-2 target nucleic acids are NOT DETECTED. The  SARS-CoV-2 RNA is generally detectable in upper and lower  respiratory specimens during the acute phase of infection. The lowest  concentration of SARS-CoV-2 viral copies this assay can detect is 250  copies / mL. A negative result does not preclude SARS-CoV-2 infection  and should not be used as the sole basis for treatment or other  patient management decisions.  A negative result may occur with  improper specimen collection / handling, submission of specimen other  than nasopharyngeal swab, presence of viral mutation(s) within the  areas targeted by this assay, and inadequate number of  viral copies  (<250 copies / mL). A negative result must be combined with clinical  observations, patient history, and epidemiological information. If result is POSITIVE SARS-CoV-2 target nucleic acids are DETECTED. The SARS-CoV-2 RNA is generally detectable in upper and lower  respiratory specimens dur ing the acute phase of infection.  Positive  results are indicative of active infection with SARS-CoV-2.  Clinical  correlation with patient history and other diagnostic information is  necessary to determine patient infection status.  Positive results do  not rule out bacterial infection or co-infection with other viruses. If result is PRESUMPTIVE POSTIVE SARS-CoV-2 nucleic acids MAY BE PRESENT.   A presumptive positive result was obtained on the submitted specimen  and confirmed on repeat testing.  While 2019 novel coronavirus  (SARS-CoV-2) nucleic acids may be present in the submitted sample  additional confirmatory testing may be necessary for epidemiological  and / or clinical management purposes  to differentiate between  SARS-CoV-2 and other Sarbecovirus currently known to infect humans.  If clinically indicated additional testing with an alternate test  methodology [phone]) is advised. The SARS-CoV-2 RNA is generally  detectable in upper and lower respiratory sp ecimens during the acute    phase of infection. The expected result is Negative. Fact Sheet for Patients:  StrictlyIdeas.no Fact Sheet for Healthcare Providers: BankingDealers.co.za This test is not yet approved or cleared by the [place] FDA and has been authorized for detection and/or diagnosis of SARS-CoV-2 by FDA under an Emergency Use Authorization (EUA).  This EUA will remain in effect (meaning this test can be used) for the duration of the COVID-19 declaration under Section 564(b)(1) of the Act, 21 U.S.C. section 360bbb-3(b)(1), unless the authorization is terminated or revoked sooner. Performed at [facility], [address], [place], [place] [address]   Culture, blood (routine x 2)     Status: None (Preliminary result)   Collection Time: 12/21/18  2:22 PM   Specimen: BLOOD LEFT WRIST  Result Value Ref Range Status   Specimen Description BLOOD LEFT WRIST  Final   Special Requests   Final    BOTTLES DRAWN AEROBIC ONLY Blood Culture results may not be optimal due to an inadequate volume of blood received in culture bottles   Culture   Final    NO GROWTH 2 DAYS Performed at [facility], [address], [place], [place] [address]    Report Status PENDING  Incomplete    Radiology Reports Dg Chest 1 View  Result Date: 12/21/2018 CLINICAL DATA:  Fever. Fell last night. Smoker. EXAM: CHEST  1 VIEW COMPARISON:  12/10/2018 and chest CT dated 07/04/2018 FINDINGS: Mildly enlarged cardiac silhouette with an interval decrease in size. Decreased prominence of the pulmonary vasculature. Stable mild prominence of the interstitial markings. No pleural fluid. Stable vascular tortuosity to the right of the upper mediastinum. Thoracic spine degenerative changes. IMPRESSION: No acute abnormality. Mild cardiomegaly and mild chronic interstitial lung disease. Electronically Signed   By: Claudie Revering M.D.   On: 12/21/2018 13:09   Dg Ankle Complete Right  Result Date:  12/21/2018 CLINICAL DATA:  Ankle pain after a fall last night. EXAM: RIGHT ANKLE - COMPLETE 3+ VIEW COMPARISON:  None. FINDINGS: There is no evidence of fracture, dislocation, or joint effusion. A plantar calcaneal enthesophyte is noted. Vascular calcifications are seen. Soft tissues are unremarkable. IMPRESSION: No acute osseous injury. Electronically Signed   By: Zerita Boers M.D.   On: 12/21/2018 13:09   Ct Head Wo Contrast  Result Date:  12/21/2018 CLINICAL DATA:  Patient status post fall. EXAM: CT HEAD WITHOUT CONTRAST CT CERVICAL SPINE WITHOUT CONTRAST TECHNIQUE: Multidetector CT imaging of the head and cervical spine was performed following the standard protocol without intravenous contrast. Multiplanar CT image reconstructions of the cervical spine were also generated. COMPARISON:  Brain CT October 03, 2014 FINDINGS: CT HEAD FINDINGS Brain: Ventricles and sulci are appropriate for patient's age. No evidence for acute cortically based infarct, intracranial hemorrhage, mass lesion or mass-effect. Unchanged remote infarct within the left centrum semiovale. Periventricular and subcortical white matter hypodensity compatible with chronic microvascular ischemic changes. Vascular: Unremarkable Skull: Intact. Sinuses/Orbits: Opacification of the left maxillary sinus Remainder of the paranasal sinuses are well aerated. Orbits are unremarkable. Other: None. CT CERVICAL SPINE FINDINGS Alignment: Normal anatomic alignment. Skull base and vertebrae: Intact. Soft tissues and spinal canal: No prevertebral fluid or swelling. No visible canal hematoma. Disc levels: Degenerative disc disease most pronounced C6-7. No evidence for acute fracture. Upper chest: Emphysematous changes. Other: There are 2 mildly prominent lower left cervical lymph nodes (image 66; series 8) measuring up to 10 mm, nonspecific however potentially reactive. IMPRESSION: No acute intracranial process. Atrophy and chronic microvascular ischemic changes.  No acute cervical spine fracture. Electronically Signed   By: Lovey Newcomer M.D.   On: 12/21/2018 13:07   Ct Cervical Spine Wo Contrast  Result Date: 12/21/2018 CLINICAL DATA:  Patient status post fall. EXAM: CT HEAD WITHOUT CONTRAST CT CERVICAL SPINE WITHOUT CONTRAST TECHNIQUE: Multidetector CT imaging of the head and cervical spine was performed following the standard protocol without intravenous contrast. Multiplanar CT image reconstructions of the cervical spine were also generated. COMPARISON:  Brain CT October 03, 2014 FINDINGS: CT HEAD FINDINGS Brain: Ventricles and sulci are appropriate for patient's age. No evidence for acute cortically based infarct, intracranial hemorrhage, mass lesion or mass-effect. Unchanged remote infarct within the left centrum semiovale. Periventricular and subcortical white matter hypodensity compatible with chronic microvascular ischemic changes. Vascular: Unremarkable Skull: Intact. Sinuses/Orbits: Opacification of the left maxillary sinus Remainder of the paranasal sinuses are well aerated. Orbits are unremarkable. Other: None. CT CERVICAL SPINE FINDINGS Alignment: Normal anatomic alignment. Skull base and vertebrae: Intact. Soft tissues and spinal canal: No prevertebral fluid or swelling. No visible canal hematoma. Disc levels: Degenerative disc disease most pronounced C6-7. No evidence for acute fracture. Upper chest: Emphysematous changes. Other: There are 2 mildly prominent lower left cervical lymph nodes (image 66; series 8) measuring up to 10 mm, nonspecific however potentially reactive. IMPRESSION: No acute intracranial process. Atrophy and chronic microvascular ischemic changes. No acute cervical spine fracture. Electronically Signed   By: Lovey Newcomer M.D.   On: 12/21/2018 13:07   US Arterial Abi (screening Lower Extremity)  Result Date: 12/21/2018 CLINICAL DATA:  Left BKA 2016.  Right lower extremity pain. EXAM: NONINVASIVE PHYSIOLOGIC VASCULAR STUDY OF BILATERAL  LOWER EXTREMITIES TECHNIQUE: Evaluation of both lower extremities were performed at rest, including calculation of ankle-brachial indices with single level Doppler, pressure and pulse volume recording. COMPARISON:  02/11/2015 FINDINGS: Right ABI:  0.48 (previously 0.57 by report) Left ABI:  Non calculable Right Lower Extremity: Weak monophasic arterial waveforms at the distal calf Left Lower Extremity:  Prior BKA IMPRESSION: Severe progressive right lower extremity arterial occlusive disease at rest. Electronically Signed   By: Lucrezia Europe M.D.   On: 12/21/2018 14:14   Dg Chest Port 1 View  Result Date: 12/10/2018 CLINICAL DATA:  Shortness of breath. EXAM: PORTABLE CHEST 1 VIEW COMPARISON:  Sep 07, 2018  FINDINGS: Stable cardiomegaly. The cardiomediastinal silhouette is stable. No pneumothorax. No nodules or masses. No focal infiltrates or overt edema. IMPRESSION: No active disease. Electronically Signed   By: Dorise Bullion III M.D   On: 12/10/2018 13:31      Emeline Gins Annete Ayuso M.D on 12/24/2018 at 2:08 PM  Between 7am to 7pm - Pager - [phone]  After 7pm go to [email or web address] - password [facility]  [facility] -  Office  [phone]

## 2018-12-24 NOTE — Progress Notes (Signed)
Patient transferred to low bed with floor mats given reason for admission.

## 2018-12-25 LAB — BASIC METABOLIC PANEL
Anion gap: 9 (ref 5–15)
Anion gap: 11 (ref 5–15)
BUN: 27 mg/dL — ABNORMAL HIGH (ref 8–23)
BUN: 31 mg/dL — ABNORMAL HIGH (ref 8–23)
CO2: 26 mmol/L (ref 22–32)
CO2: 24 mmol/L (ref 22–32)
Calcium: 8.8 mg/dL — ABNORMAL LOW (ref 8.9–10.3)
Calcium: 8.9 mg/dL (ref 8.9–10.3)
Chloride: 102 mmol/L (ref 98–111)
Chloride: 102 mmol/L (ref 98–111)
Creatinine, Ser: 1.69 mg/dL — ABNORMAL HIGH (ref 0.61–1.24)
Creatinine, Ser: 1.54 mg/dL — ABNORMAL HIGH (ref 0.61–1.24)
GFR calc Af Amer: 39 mL/min — ABNORMAL LOW (ref >60)
GFR calc Af Amer: 44 mL/min — ABNORMAL LOW (ref >60)
GFR calc non Af Amer: 34 mL/min — ABNORMAL LOW (ref >60)
GFR calc non Af Amer: 38 mL/min — ABNORMAL LOW (ref >60)
Glucose, Bld: 114 mg/dL — ABNORMAL HIGH (ref 70–99)
Glucose, Bld: 129 mg/dL — ABNORMAL HIGH (ref 70–99)
Potassium: 4.3 mmol/L (ref 3.5–5.1)
Potassium: 4.4 mmol/L (ref 3.5–5.1)
Sodium: 137 mmol/L (ref 135–145)
Sodium: 137 mmol/L (ref 135–145)

## 2018-12-25 LAB — CBC
HCT: 41.3 % (ref 39.0–52.0)
Hemoglobin: 13.5 g/dL (ref 13.0–17.0)
MCH: 27.8 pg (ref 26.0–34.0)
MCHC: 32.7 g/dL (ref 30.0–36.0)
MCV: 85 fL (ref 80.0–100.0)
Platelets: 145 10*3/uL — ABNORMAL LOW (ref 150–400)
RBC: 4.86 MIL/uL (ref 4.22–5.81)
RDW: 15.1 % (ref 11.5–15.5)
WBC: 10.7 10*3/uL — ABNORMAL HIGH (ref 4.0–10.5)
nRBC: 0 % (ref 0.0–0.2)

## 2018-12-25 LAB — PROCALCITONIN: Procalcitonin: 0.35 ng/mL

## 2018-12-25 LAB — MAGNESIUM: Magnesium: 2 mg/dL (ref 1.7–2.4)

## 2018-12-25 LAB — PROTIME-INR
INR: 2.7 — ABNORMAL HIGH (ref 0.8–1.2)
Prothrombin Time: 27.9 seconds — ABNORMAL HIGH (ref 11.4–15.2)

## 2018-12-25 MED ORDER — METHYLPREDNISOLONE SODIUM SUCC 125 MG IJ SOLR
60.0000 mg | Freq: Three times a day (TID) | INTRAMUSCULAR | Status: DC
  Administered 2018-12-25 – 2018-12-26 (×3): 60 mg via INTRAVENOUS
  Filled 2018-12-25 (×3): qty 2

## 2018-12-25 MED ORDER — FUROSEMIDE 40 MG PO TABS
40.0000 mg | ORAL_TABLET | Freq: Every day | ORAL | Status: DC
  Administered 2018-12-25 – 2018-12-26 (×2): 40 mg via ORAL
  Filled 2018-12-25 (×3): qty 1

## 2018-12-25 MED ORDER — WARFARIN SODIUM 2.5 MG PO TABS
2.5000 mg | ORAL_TABLET | Freq: Once | ORAL | Status: AC
  Administered 2018-12-25: 2.5 mg via ORAL
  Filled 2018-12-25: qty 1

## 2018-12-25 MED ORDER — ISOSORB DINITRATE-HYDRALAZINE 20-37.5 MG PO TABS
1.0000 | ORAL_TABLET | Freq: Three times a day (TID) | ORAL | Status: DC
  Administered 2018-12-25 – 2018-12-26 (×4): 1 via ORAL
  Filled 2018-12-25 (×4): qty 1

## 2018-12-25 MED ORDER — COLCHICINE 0.6 MG PO TABS
0.6000 mg | ORAL_TABLET | Freq: Every day | ORAL | Status: DC
  Administered 2018-12-25 – 2018-12-26 (×2): 0.6 mg via ORAL
  Filled 2018-12-25 (×2): qty 1

## 2018-12-25 NOTE — Plan of Care (Signed)

## 2018-12-25 NOTE — Progress Notes (Signed)
Pt's family at bedside. Concerned about pt's new onset cough. States pt is very susceptible to PNA. Cough does appear congested but unable to cough anything up at this time. Lungs clear. V/S WNL. Will continue to monitor.

## 2018-12-25 NOTE — Progress Notes (Signed)
Pt had 20 beat run of V-tach with wide QRS & a rate of 120 at 16:55 pm per CCMD at 17:55 pm. Asymptomatic. Made  Cardiology aware.

## 2018-12-25 NOTE — TOC Initial Note (Signed)
Transition of Care [facility]) - Initial/Assessment Note    Patient Details  Name: Scott [name] MRN: [id number] Date of Birth: 04/05/1922  Transition of Care [facility]) CM/SW Contact:    Alberteen Sam, [place] Phone Number: [phone] 12/25/2018, 12:53 PM  Clinical Narrative:                  CSW spoke with patient's nephew Remo Lipps to discuss home health. He reports patient's family has all gone through [facility] in the past and requests referral be sent to [facility].   CSW spoke with Mateo Flow at [facility] who reports they can accept patient for Home health PT and OT. No further needs identified at this time. Nephew reports family will transport patient home when medically cleared.   Expected Discharge Plan: [place] Barriers to Discharge: Continued Medical Work up   Patient Goals and CMS Choice   CMS Medicare.gov Compare Post Acute Care list provided to:: Patient Represenative (must comment)(nephew Remo Lipps) Choice offered to / list presented to : (nephew Remo Lipps)  Expected Discharge Plan and Services Expected Discharge Plan: [place]     Post Acute Care Choice: [place] arrangements for the past 2 months: Single Family Home Expected Discharge Date: 12/23/18                         HH Arranged: OT, PT HH Agency: [facility] ([place]) Date HH Agency Contacted: 12/25/18 Time HH Agency Contacted: 4 Representative spoke with at [place]: [place] Arrangements/Services Living arrangements for the past 2 months: [place] with:: Self Patient language and need for interpreter reviewed:: Yes Do you feel safe going back to the place where you live?: Yes      Need for Family Participation in Patient Care: Yes (Comment) Care giver support system in place?: Yes (comment)   Criminal Activity/Legal Involvement Pertinent to Current Situation/Hospitalization: No - Comment as needed  Activities of  Daily Living Home Assistive Devices/Equipment: Wheelchair, Prosthesis, Grab bars around toilet, Grab bars in shower ADL Screening (condition at time of admission) Patient's cognitive ability adequate to safely complete daily activities?: Yes Is the patient deaf or have difficulty hearing?: Yes Does the patient have difficulty seeing, even when wearing glasses/contacts?: Yes Does the patient have difficulty concentrating, remembering, or making decisions?: Yes Patient able to express need for assistance with ADLs?: Yes Does the patient have difficulty dressing or bathing?: Yes Independently performs ADLs?: Yes (appropriate for developmental age) Communication: Independent Dressing (OT): Needs assistance Is this a change from baseline?: Pre-admission baseline Grooming: Needs assistance Is this a change from baseline?: Pre-admission baseline Feeding: Independent Bathing: Needs assistance Is this a change from baseline?: Pre-admission baseline Toileting: Needs assistance Is this a change from baseline?: Pre-admission baseline In/Out Bed: Needs assistance Is this a change from baseline?: Change from baseline, expected to last >3 days Walks in Home: Needs assistance Is this a change from baseline?: Change from baseline, expected to last >3 days(wc) Does the patient have difficulty walking or climbing stairs?: Yes Weakness of Legs: Right Weakness of Arms/Hands: None  Permission Sought/Granted Permission sought to share information with : Case Manager, Customer service manager, Family Supports Permission granted to share information with : Yes, Verbal Permission Granted  Share Information with NAME: Remo Lipps  Permission granted to share info w AGENCY: [place] granted to share info w Relationship: nephew  Permission granted to  share info w Contact Information: [phone]  Emotional Assessment Appearance:: Appears stated age Attitude/Demeanor/Rapport:  Unable to Assess Affect (typically observed): Unable to Assess Orientation: : Oriented to Self, Oriented to Place, Oriented to  Time, Oriented to Situation Alcohol / Substance Use: Not Applicable Psych Involvement: No (comment)  Admission diagnosis:  Ischemia [I99.8] Right leg pain [M79.604] Fever [R50.9] Fall, initial encounter [W19.XXXA] Fever, unspecified fever cause [R50.9] Patient Active Problem List   Diagnosis Date Noted  . Foot pain 12/21/2018  . Cellulitis 12/21/2018  . CKD (chronic kidney disease) stage 3, GFR 30-59 ml/min (HCC) 12/11/2018  . Acute on chronic combined systolic and diastolic CHF (congestive heart failure) ([place]) 12/11/2018  . Acute respiratory failure with hypoxia ([place])   . Dyspnea 12/10/2018  . Alzheimer disease ([place]) 09/07/2018  . SIRS (systemic inflammatory response syndrome) ([place]) 06/22/2018  . Paroxysmal atrial fibrillation ([place]) 01/18/2017  . PVC (pulmonary venous congestion) 01/18/2017  . Thoracic aortic atherosclerosis ([place]) 11/26/2016  . Thrombocytopenia ([place]) 08/04/2016  . Chronic kidney disease (CKD) stage G3a/A1, moderately decreased glomerular filtration rate (GFR) between 45-59 mL/min/1.73 square meter and albuminuria creatinine ratio less than 30 mg/g (HCC) 08/04/2016  . Essential hypertension 11/29/2015  . Bradycardia 11/28/2015  . Malignant neoplasm of lateral wall of urinary bladder ([place]) 07/10/2015  . Status post above-knee amputation of left lower extremity ([place]) 10/18/2014  . Tachyarrhythmia 10/05/2014  . Aspiration pneumonia ([place]) 10/05/2014  . Lower urinary tract infectious disease 10/03/2014  . Sepsis due to other etiology ([place]) 10/03/2014  . Gangrene of toe ([place]) 10/03/2014  . Ischemic ulcer of left foot ([place]) 10/03/2014  . Atherosclerotic PVD with ulceration ([place]) 10/03/2014  . Atrial fibrillation with RVR ([place]) 10/03/2014  . Hyperkalemia 10/03/2014  . PAD (peripheral artery disease) ([place]) 09/24/2014  . NSTEMI (non-ST elevated  myocardial infarction) ([place]) 10/09/2013  . High risk medication use 05/17/2013  . BPH (benign prostatic hyperplasia) 05/17/2013  . Vitamin D deficiency 05/17/2013  . History of pulmonary embolism (2014) 04/17/2013  . NSVT (nonsustained ventricular tachycardia)- not an ICD candidate 04/17/2013  . NICM (nonischemic cardiomyopathy)- EF 20-25% by Echo 04/12/13 04/17/2013  . Chronic combined systolic and diastolic heart failure ([place]) 03/15/2013  . DVT of lower extremity, bilateral ([place]) 03/15/2013  . Fatigue 11/02/2012  . Constipation 11/02/2012  . Syncope 06/21/2012  . Bladder cancer ([place]) 03/15/2012  . Benign localized hyperplasia of prostate with urinary obstruction 03/15/2012  . Hyperlipidemia   . PVCs (premature ventricular contractions)   . CAD- non obstructive disease by cath 3/14    PCP:  Dettinger, Fransisca Kaufmann, MD Pharmacy:   [facility], [address] [place] [address] Phone: [phone] Fax: [phone]     Social Determinants of Health (SDOH) Interventions    Readmission Risk Interventions Readmission Risk Prevention Plan 12/11/2018 06/23/2018  Transportation Screening Complete Complete  PCP or Specialist Appt within 3-5 Days - Complete  HRI or [place] - Not Complete  HRI or Home Care Consult comments - patient declines  Social Work Consult for [place] Planning/Counseling - Complete  Palliative Care Screening - Not Applicable  Medication Review Press photographer) Complete Complete  PCP or Specialist appointment within 3-5 days of discharge Complete -  [place] or [place] Not Complete -  [place] or Home Care Consult Pt Refusal Comments not ready -  SW Recovery Care/Counseling Consult Complete -  Palliative Care Screening Not Applicable -  [place] Not Complete -  SNF Comments 24/7 care -  Some recent data might be hidden

## 2018-12-25 NOTE — Progress Notes (Signed)
Occupational Therapy Evaluation Patient Details Name: Scott [name] MRN: [id number] DOB: 02-Jul-1921 Today's Date: 12/25/2018    History of Present Illness Pt is a 85 y.o. male admitted 12/21/18 with confusion, found by family after fall at home. Pt with R foot pain; imaging negative for acute injury. Also with elevated troponin, now trending down; poor cath candidate. PMH includes arthritis, CAD, COPD, DDD, glaucoma, HTN, PAD, L AKA (2016), dementia.   Clinical Impression   PTA, pt was living at home alone with 24/7 PCA who assisted with all ADL/IADL and pt reports he was independent with transfers to/from his w/c. Pt currently appears to be functioning at/close to baseline for ADL. Feel he will benefit from continued PCA services and HHPT to maximize functional mobility. Pt's son present during session and reports PCA does a great job encouraging pt's independence. Pt and family with no additional questions. Do not feel pt needs any additional OT services. OT will sign off. Thank you for referral.     Follow Up Recommendations  No OT follow up;Supervision/Assistance - 24 hour    Equipment Recommendations  3 in 1 bedside commode    Recommendations for Other Services       Precautions / Restrictions Precautions Precautions: Fall Precaution Comments: Previous L AKA Restrictions Weight Bearing Restrictions: No      Mobility Bed Mobility Overal bed mobility: Modified Independent             General bed mobility comments: Increased time; able to come to sitting with HOB flat, no use of bed rail  Transfers Overall transfer level: Needs assistance Equipment used: None Transfers: Squat Pivot Transfers     Squat pivot transfers: Supervision     General transfer comment: pt able to transfer from EOB to [facility] toward R and transfer from [facility] to EOB towards L;     Balance Overall balance assessment: Needs assistance Sitting-balance support: Feet supported;No upper extremity  supported Sitting balance-Leahy Scale: Good     Standing balance support: Single extremity supported;During functional activity Standing balance-Leahy Scale: Poor Standing balance comment: Reliant on armrest of chair during transfer                           ADL either performed or assessed with clinical judgement   ADL Overall ADL's : At baseline                                       General ADL Comments: pt appears to be functioning at baseline level requiring assistance for all ADL;pt able to transfer to/from [facility] to EOB;pt reports he feels he is at baseline     Vision         Perception     Praxis      Pertinent Vitals/Pain Pain Assessment: Faces Faces Pain Scale: Hurts a little bit Pain Location: R foot with WB Pain Descriptors / Indicators: Discomfort Pain Intervention(s): Limited activity within patient's tolerance;Monitored during session     Hand Dominance Right   Extremity/Trunk Assessment Upper Extremity Assessment Upper Extremity Assessment: Overall WFL for tasks assessed   Lower Extremity Assessment Lower Extremity Assessment: Defer to PT evaluation   Cervical / Trunk Assessment Cervical / Trunk Assessment: Normal   Communication Communication Communication: HOH   Cognition Arousal/Alertness: Awake/alert Behavior During Therapy: WFL for tasks assessed/performed Overall Cognitive Status: Within Functional Limits for tasks  assessed                                 General Comments: WFL for simple tasks, answering all questions appropriately; likely baseline cognition   General Comments  pts son present duirng session    Exercises     Shoulder Instructions      Home Living Family/patient expects to be discharged to:: Private residence Living Arrangements: Alone Available Help at Discharge: Personal care attendant;Available 24 hours/day Type of Home: House Home Access: Ramped entrance     Home  Layout: One level     Bathroom Shower/Tub: Occupational psychologist: Standard Bathroom Accessibility: Yes How Accessible: Accessible via walker;Accessible via wheelchair Home Equipment: Wheelchair - power;Walker - 2 wheels;Shower seat   Additional Comments: Pt reports PCA 24 hrs/day      Prior Functioning/Environment Level of Independence: Needs assistance  Gait / Transfers Assistance Needed: Non-ambulatory, uses power wheelchair for mobility, Mod Indep stand pivot transfers ADL's / Homemaking Assistance Needed: assisted for bathing, dressing and household ADLs by paid attendants 24/7            OT Problem List: Impaired balance (sitting and/or standing);Decreased safety awareness;Decreased knowledge of use of DME or AE      OT Treatment/Interventions:      OT Goals(Current goals can be found in the care plan section) Acute Rehab OT Goals Patient Stated Goal: Home with continued PCA assist and HHPT services OT Goal Formulation: With patient Time For Goal Achievement: 01/08/19 Potential to Achieve Goals: Good  OT Frequency:     Barriers to D/C:            Co-evaluation              AM-PAC OT "6 Clicks" Daily Activity     Outcome Measure Help from another person eating meals?: None Help from another person taking care of personal grooming?: A Little Help from another person toileting, which includes using toliet, bedpan, or urinal?: A Little Help from another person bathing (including washing, rinsing, drying)?: A Little Help from another person to put on and taking off regular upper body clothing?: A Little Help from another person to put on and taking off regular lower body clothing?: A Lot 6 Click Score: 18   End of Session Equipment Utilized During Treatment: Gait belt Nurse Communication: Mobility status  Activity Tolerance: Patient tolerated treatment well Patient left: in bed;with call bell/phone within reach;with bed alarm set;with  nursing/sitter in room  OT Visit Diagnosis: Other abnormalities of gait and mobility (R26.89);History of falling (Z91.81)                Time: 1525-1550 OT Time Calculation (min): 25 min Charges:  OT General Charges $OT Visit: 1 Visit OT Evaluation $OT Eval Moderate Complexity: 1 Mod OT Treatments $Self Care/Home Management : 8-22 mins  Dorinda Hill OTR/L Acute Rehabilitation Services Office: [place] 12/25/2018, 3:56 PM

## 2018-12-25 NOTE — Progress Notes (Signed)
ANTICOAGULATION CONSULT NOTE - Follow Up Consult  Pharmacy Consult for Coumadin Indication: Afib, DVT, PVD  Allergies  Allergen Reactions  . Lipitor [Atorvastatin] Other (See Comments)    Unknown rxn per pt    Patient Measurements: Height: 5\' 6"  (167.6 cm) Weight: 191 lb (86.6 kg) IBW/kg (Calculated) : 63.8  Vital Signs: Temp: 98.7 F (37.1 C) (09/14 1120) Temp Source: Oral (09/14 1120) BP: 143/70 (09/14 1120) Pulse Rate: 71 (09/14 1120)  Labs: Recent Labs    12/23/18 0638 12/24/18 0452 12/25/18 0642 12/25/18 0756  HGB 13.5 13.9  --  13.5  HCT 42.2 43.4  --  41.3  PLT 127* 136*  --  145*  LABPROT 22.3* 25.8* 27.9*  --   INR 2.0* 2.4* 2.7*  --   CREATININE 1.92* 1.82* 1.69*  --     Estimated Creatinine Clearance: 27 mL/min (A) (by C-G formula based on SCr of 1.69 mg/dL (H)).  Assessment:  Anticoag: Warfarin PTA with h/o DVT, PAF, PVD. ACS/STEMI. Elevated troponin on admission. IV hep>warfarin. INR 2.7. CBC WNL PTA dosing: 2.5mg  daily, except mon/fri take 5mg   Goal of Therapy:  INR 2-3 Monitor platelets by anticoagulation protocol: Yes   Plan:   Give warfarin 2.5mg  PO x1 tonight Daily INR, s/s bleeding    Semone Orlov S. Alford Highland, PharmD, BCPS Clinical Staff Pharmacist [place]ilene Ghazi [place] 12/25/2018,11:23 AM

## 2018-12-25 NOTE — Progress Notes (Signed)
Nurse paged for 20 beat run of NSVT, asymptomatic. Will check stat lytes. Care order placed to call if K 3.8 or less, or Mg 1.8 or less.  Cordney Barstow PA-C

## 2018-12-25 NOTE — Care Management Important Message (Signed)
Important Message  Patient Details  Name: Scott [name] MRN: [id number] Date of Birth: 02-15-1922   Medicare Important Message Given:  Yes     Shelda Altes 12/25/2018, 1:35 PM

## 2018-12-25 NOTE — Progress Notes (Signed)
PROGRESS NOTE                                                                                                                                                                                                             Patient Demographics:    Scott [name], is a 85 y.o. male, DOB - August 06, 1921, [id number]  Admit date - 12/21/2018   Admitting Physician Costin Karlyne Greenspan, MD  Outpatient Primary MD for the patient is Dettinger, Fransisca Kaufmann, MD  LOS - 4   Chief Complaint  Patient presents with   Fall       Brief Narrative    85 y.o. 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 fib, hypertension, chronic kidney disease stage III, COPD, bladder cancer, CAD, left AKA who is being brought to the hospital by family due to mild confusion this morning.  They also report that last night, patient was found down caught in between the bed and the wall.  Patient denies any syncopal episodes and tells me he lost balance and fell.  He has a history of AKA but uses his right foot to pivot.  He denies any chest pain, denies any abdominal pain, nausea or vomiting.  He denies any fever or chills.  He does complain of right foot pain when he touch it but not at rest.  He is alert and oriented x4 however not forthcoming with answering questions.  The nephew, who is the main caregiver is at bedside and contributed to the story.  He was recently admitted to the hospital and discharge 10 days ago due to COPD exacerbation and pulmonary edema.  ED Course: In the emergency room patient is found to have a temp of 100.4, slightly tachypneic, the rest of the vital signs are stable. His blood work shows a creatinine of 1.69, high-sensitivity troponin 1324, lactic acid initially 1.7 on repeat 2.2.  He has a white count of 15.  Covid was negative.  CT head and C-spine are negative.  Ankle x-ray negative. Chest x-ray without acute  abnormalities, mild cardiomegaly and chronic interstitial lung changes.  He had an ABI given poor pulses on the right lower extremity which showed severe progressive arterial occlusive disease at rest.  Dr. Carlis Abbott with vascular surgery and Dr. Harl Bowie with cardiology were consulted.  Patient will be admitted to [facility] for vascular surgery to be able to consult on him.    Subjective:    Scott [name] today reports his right ankle pain significantly subsided, denies any chest pain, shortness of breath, he did reported some occasional cough in the morning yesterday, but no recurrence, no fever or dyspnea .    Assessment  & Plan :    Principal Problem:   Foot pain Active Problems:   Hyperlipidemia   CAD- non obstructive disease by cath 3/14   Bladder cancer (HCC)   Chronic combined systolic and diastolic heart failure (HCC)   NICM (nonischemic cardiomyopathy)- EF 20-25% by Echo 04/12/13   BPH (benign prostatic hyperplasia)   NSTEMI (non-ST elevated myocardial infarction) ([place])   PAD (peripheral artery disease) (HCC)   Essential hypertension   Chronic kidney disease (CKD) stage G3a/A1, moderately decreased glomerular filtration rate (GFR) between 45-59 mL/min/1.73 square meter and albuminuria creatinine ratio less than 30 mg/g (HCC)   Paroxysmal atrial fibrillation (HCC)   Alzheimer disease (HCC)   Cellulitis  Elevated troponin in the setting of known coronary artery disease - significant increase from baseline (28>>29 last month), now 1,324>>1.350>>1,028, finding concerning for NSTEMI -Cardiology input greatly appreciated, given his age, multiple comorbidities, CKD, and overall poor health, he is currently a poor cath candidate, particularly in the absence of chest pain. -Continue with aspirin, statin, Imdur, started on Coreg, as well he remains on warfarin  Right foot pain/PVD with cellulitis. -Patient currently on broad-spectrum antibiotics with vancomycin and ceftriaxone.  He is afebrile  over last 24 hours, he does have some leukocytosis, but this is most like related to steroid he received yesterday, procalcitonin trending down which is reassuring. -As well very likely he is having gout flare in the right ankle contributing to his symptoms, and significantly improved after receiving Solu-Medrol and colchicine yesterday, will continue today as well.  Chronic combined systolic and diastolic CHF -Most recent EF 35-40% just the end of August 2020.  Appears euvolemic.  Given fever/cellulitis along with mildly elevated lactic acid and creatinine. -Continue input greatly appreciated, no further bradycardia, so he is started on Coreg, and BiDil as well .  AKI on Chronic kidney disease stage III -Baseline creatinine between 1.2 and 1.5, it at 1.9, improving, it is 1.69 today, monitor closely as he is back on his Lasix .  COPD -Recently admitted for COPD exacerbation, currently has no wheezing, stable, continue home medications.  Paroxysmal A. Fib -currently in sinus,  -on warfarin, pharmacy to dose   Hypertension -Please see above discussion  History of DVT/PE -On Coumadin at home  Dementia without behavioral disturbances -Continue Aricept, however patient alert and oriented x4 on my evaluation  Bladder cancer -Outpatient follow-up   Code Status : DNR  Family Communication  : Discussed with son via phone today.  Disposition Plan  : Home once stable  Barriers For Discharge : remains on IV antibiotics  Consults  : Cardiology, vascular surgery  Procedures  : None  DVT Prophylaxis  : Warfarin  Lab Results  Component Value Date   PLT 145 (L) 12/25/2018    Antibiotics  :    Anti-infectives (From admission, onward)   Start     Dose/Rate Route Frequency Ordered Stop   12/23/18 0500  vancomycin (VANCOCIN) IVPB 1000 mg/200 mL premix     1,000 mg 200 mL/hr over 60 Minutes Intravenous Every 36 hours 12/22/18 1109     12/22/18 1600  vancomycin (VANCOCIN) 500  mg in sodium chloride 0.9 % 100 mL IVPB  Status:  Discontinued     500 mg 100 mL/hr over 60 Minutes Intravenous Every 24 hours 12/21/18 1518 12/22/18 1109   12/22/18 1500  cefTRIAXone (ROCEPHIN) 1 g in sodium chloride 0.9 % 100 mL IVPB     1 g 200 mL/hr over 30 Minutes Intravenous Every 24 hours 12/21/18 2139     12/21/18 1630  vancomycin (VANCOCIN) IVPB 750 mg/150 ml premix     750 mg 150 mL/hr over 60 Minutes Intravenous  Once 12/21/18 1515 12/21/18 1823   12/21/18 1530  vancomycin (VANCOCIN) IVPB 750 mg/150 ml premix     750 mg 150 mL/hr over 60 Minutes Intravenous  Once 12/21/18 1515 12/21/18 1720   12/21/18 1500  vancomycin (VANCOCIN) IVPB 1000 mg/200 mL premix  Status:  Discontinued     1,000 mg 200 mL/hr over 60 Minutes Intravenous  Once 12/21/18 1446 12/21/18 1515   12/21/18 1500  cefTRIAXone (ROCEPHIN) 2 g in sodium chloride 0.9 % 100 mL IVPB     2 g 200 mL/hr over 30 Minutes Intravenous  Once 12/21/18 1446 12/21/18 1550        Objective:   Vitals:   12/25/18 0109 12/25/18 0520 12/25/18 0853 12/25/18 1120  BP:  (!) 186/95 (!) 166/102 (!) 143/70  Pulse:  73 69 71  Resp:  18  17  Temp:  98.3 F (36.8 C)  98.7 F (37.1 C)  TempSrc:  Oral  Oral  SpO2: 95% 93%  97%  Weight:  86.6 kg    Height:        Wt Readings from Last 3 Encounters:  12/25/18 86.6 kg  12/12/18 68.9 kg  10/20/18 72.5 kg     Intake/Output Summary (Last 24 hours) at 12/25/2018 1146 Last data filed at 12/25/2018 1022 Gross per 24 hour  Intake 1023.9 ml  Output 775 ml  Net 248.9 ml     Physical Exam  Awake Alert, Oriented X 3, No new F.N deficits, Normal affect Symmetrical Chest wall movement, Good air movement bilaterally, CTAB RRR,No Gallops,Rubs or new Murmurs, No Parasternal Heave +ve B.Sounds, Abd Soft, No tenderness, No rebound - guarding or rigidity. No Cyanosis, Clubbing , right ankle swelling and erythema has significantly subsided, left AKA   Data Review:    CBC Recent Labs    Lab 12/20/18 1117 12/21/18 1209 12/22/18 0123 12/23/18 0638 12/24/18 0452 12/25/18 0756  WBC 7.9 15.0* 10.7* 11.4* 7.8 10.7*  HGB 13.9 14.5 13.4 13.5 13.9 13.5  HCT 42.3 46.1 42.3 42.2 43.4 41.3  PLT 194 163 139* 127* 136* 145*  MCV 85 87.6 86.5 87.4 85.1 85.0  MCH 27.9 27.6 27.4 28.0 27.3 27.8  MCHC 32.9 31.5 31.7 32.0 32.0 32.7  RDW 14.6 16.0* 15.7* 15.6* 15.4 15.1  LYMPHSABS 1.7 1.0  --   --   --   --   MONOABS  --  0.8  --   --   --   --   EOSABS 0.1 0.0  --   --   --   --   BASOSABS 0.0 0.0  --   --   --   --     Chemistries  Recent Labs  Lab 12/20/18 1117 12/21/18 1209 12/22/18 0123 12/23/18 0638 12/24/18 0452 12/25/18 0642  NA 146* 141 140 137 139 137  K 3.8 3.7 3.6 3.5 3.4* 4.3  CL 102 100 103 101 100 102  CO2 29 31 29 25  28  26  GLUCOSE 75 89 123* 127* 97 114*  BUN 32 35* 31* 31* 29* 27*  CREATININE 1.43* 1.69* 1.59* 1.92* 1.82* 1.69*  CALCIUM 9.1 8.9 8.7* 8.7* 8.9 8.8*  AST 15 18 21   --   --   --   ALT 21 20 17   --   --   --   ALKPHOS 153* 122 107  --   --   --   BILITOT 0.3 1.2 1.0  --   --   --    ------------------------------------------------------------------------------------------------------------------ No results for input(s): CHOL, HDL, LDLCALC, TRIG, CHOLHDL, LDLDIRECT in the last 72 hours.  Lab Results  Component Value Date   HGBA1C 5.8 (H) 11/28/2015   ------------------------------------------------------------------------------------------------------------------ No results for input(s): TSH, T4TOTAL, T3FREE, THYROIDAB in the last 72 hours.  Invalid input(s): FREET3 ------------------------------------------------------------------------------------------------------------------ No results for input(s): VITAMINB12, FOLATE, FERRITIN, TIBC, IRON, RETICCTPCT in the last 72 hours.  Coagulation profile Recent Labs  Lab 12/21/18 1209 12/22/18 0123 12/23/18 [id number] 12/24/18 0452 12/25/18 0642  INR 2.1* 2.0* 2.0* 2.4* 2.7*    No  results for input(s): DDIMER in the last 72 hours.  Cardiac Enzymes No results for input(s): CKMB, TROPONINI, MYOGLOBIN in the last 168 hours.  Invalid input(s): CK ------------------------------------------------------------------------------------------------------------------    Component Value Date/Time   BNP 1,713.0 (H) 12/10/2018 1251    Inpatient Medications  Scheduled Meds:  aspirin EC  81 mg Oral Daily   carvedilol  3.125 mg Oral BID WC   colchicine  0.6 mg Oral Daily   donepezil  10 mg Oral QHS   fluticasone  1 spray Each Nare QHS   furosemide  40 mg Oral Daily   influenza vaccine adjuvanted  0.5 mL Intramuscular Tomorrow-1000   isosorbide-hydrALAZINE  1 tablet Oral TID   methylPREDNISolone (SOLU-MEDROL) injection  60 mg Intravenous Q8H   mirabegron ER  25 mg Oral q morning - 10a   sertraline  50 mg Oral q morning - 10a   simvastatin  10 mg Oral QHS   warfarin  2.5 mg Oral ONCE-1800   Warfarin - Pharmacist Dosing Inpatient   Does not apply q1800   Continuous Infusions:  sodium chloride 250 mL (12/24/18 1605)   cefTRIAXone (ROCEPHIN)  IV Stopped (12/24/18 1640)   vancomycin 1,000 mg (12/24/18 1742)   PRN Meds:.sodium chloride, acetaminophen **OR** acetaminophen, ipratropium-albuterol, ondansetron **OR** ondansetron (ZOFRAN) IV  Micro Results Recent Results (from the past 240 hour(s))  Culture, blood (routine x 2)     Status: None (Preliminary result)   Collection Time: 12/21/18 12:22 PM   Specimen: BLOOD RIGHT ARM  Result Value Ref Range Status   Specimen Description BLOOD RIGHT ARM DRAWN BY RN  Final   Special Requests   Final    BOTTLES DRAWN AEROBIC AND ANAEROBIC Blood Culture adequate volume   Culture   Final    NO GROWTH 4 DAYS Performed at [facility], [address] [place], [place] [address]    Report Status PENDING  Incomplete  SARS Coronavirus 2 [facility] order, Performed in [place] hospital lab) Nasopharyngeal Nasopharyngeal  Swab     Status: None   Collection Time: 12/21/18  1:39 PM   Specimen: Nasopharyngeal Swab  Result Value Ref Range Status   SARS Coronavirus 2 NEGATIVE NEGATIVE Final    Comment: (NOTE) If result is NEGATIVE SARS-CoV-2 target nucleic acids are NOT DETECTED. The SARS-CoV-2 RNA is generally detectable in upper and lower  respiratory specimens during the acute phase of infection. The lowest  concentration  of SARS-CoV-2 viral copies this assay can detect is 250  copies / mL. A negative result does not preclude SARS-CoV-2 infection  and should not be used as the sole basis for treatment or other  patient management decisions.  A negative result may occur with  improper specimen collection / handling, submission of specimen other  than nasopharyngeal swab, presence of viral mutation(s) within the  areas targeted by this assay, and inadequate number of viral copies  (<250 copies / mL). A negative result must be combined with clinical  observations, patient history, and epidemiological information. If result is POSITIVE SARS-CoV-2 target nucleic acids are DETECTED. The SARS-CoV-2 RNA is generally detectable in upper and lower  respiratory specimens dur ing the acute phase of infection.  Positive  results are indicative of active infection with SARS-CoV-2.  Clinical  correlation with patient history and other diagnostic information is  necessary to determine patient infection status.  Positive results do  not rule out bacterial infection or co-infection with other viruses. If result is PRESUMPTIVE POSTIVE SARS-CoV-2 nucleic acids MAY BE PRESENT.   A presumptive positive result was obtained on the submitted specimen  and confirmed on repeat testing.  While 2019 novel coronavirus  (SARS-CoV-2) nucleic acids may be present in the submitted sample  additional confirmatory testing may be necessary for epidemiological  and / or clinical management purposes  to differentiate between  SARS-CoV-2  and other Sarbecovirus currently known to infect humans.  If clinically indicated additional testing with an alternate test  methodology [phone]) is advised. The SARS-CoV-2 RNA is generally  detectable in upper and lower respiratory sp ecimens during the acute  phase of infection. The expected result is Negative. Fact Sheet for Patients:  StrictlyIdeas.no Fact Sheet for Healthcare Providers: BankingDealers.co.za This test is not yet approved or cleared by the [place] FDA and has been authorized for detection and/or diagnosis of SARS-CoV-2 by FDA under an Emergency Use Authorization (EUA).  This EUA will remain in effect (meaning this test can be used) for the duration of the COVID-19 declaration under Section 564(b)(1) of the Act, 21 U.S.C. section 360bbb-3(b)(1), unless the authorization is terminated or revoked sooner. Performed at [facility], [address], [place], [place] [address]   Culture, blood (routine x 2)     Status: None (Preliminary result)   Collection Time: 12/21/18  2:22 PM   Specimen: BLOOD LEFT WRIST  Result Value Ref Range Status   Specimen Description BLOOD LEFT WRIST  Final   Special Requests   Final    BOTTLES DRAWN AEROBIC ONLY Blood Culture results may not be optimal due to an inadequate volume of blood received in culture bottles   Culture   Final    NO GROWTH 4 DAYS Performed at [facility], [address], [place], [place] [address]    Report Status PENDING  Incomplete    Radiology Reports Dg Chest 1 View  Result Date: 12/21/2018 CLINICAL DATA:  Fever. Fell last night. Smoker. EXAM: CHEST  1 VIEW COMPARISON:  12/10/2018 and chest CT dated 07/04/2018 FINDINGS: Mildly enlarged cardiac silhouette with an interval decrease in size. Decreased prominence of the pulmonary vasculature. Stable mild prominence of the interstitial markings. No pleural fluid. Stable vascular tortuosity to the right of the upper  mediastinum. Thoracic spine degenerative changes. IMPRESSION: No acute abnormality. Mild cardiomegaly and mild chronic interstitial lung disease. Electronically Signed   By: Claudie Revering M.D.   On: 12/21/2018 13:09   Dg Ankle Complete Right  Result Date: 12/21/2018 CLINICAL DATA:  Ankle pain after a fall last night. EXAM: RIGHT ANKLE - COMPLETE 3+ VIEW COMPARISON:  None. FINDINGS: There is no evidence of fracture, dislocation, or joint effusion. A plantar calcaneal enthesophyte is noted. Vascular calcifications are seen. Soft tissues are unremarkable. IMPRESSION: No acute osseous injury. Electronically Signed   By: Zerita Boers M.D.   On: 12/21/2018 13:09   Ct Head Wo Contrast  Result Date: 12/21/2018 CLINICAL DATA:  Patient status post fall. EXAM: CT HEAD WITHOUT CONTRAST CT CERVICAL SPINE WITHOUT CONTRAST TECHNIQUE: Multidetector CT imaging of the head and cervical spine was performed following the standard protocol without intravenous contrast. Multiplanar CT image reconstructions of the cervical spine were also generated. COMPARISON:  Brain CT October 03, 2014 FINDINGS: CT HEAD FINDINGS Brain: Ventricles and sulci are appropriate for patient's age. No evidence for acute cortically based infarct, intracranial hemorrhage, mass lesion or mass-effect. Unchanged remote infarct within the left centrum semiovale. Periventricular and subcortical white matter hypodensity compatible with chronic microvascular ischemic changes. Vascular: Unremarkable Skull: Intact. Sinuses/Orbits: Opacification of the left maxillary sinus Remainder of the paranasal sinuses are well aerated. Orbits are unremarkable. Other: None. CT CERVICAL SPINE FINDINGS Alignment: Normal anatomic alignment. Skull base and vertebrae: Intact. Soft tissues and spinal canal: No prevertebral fluid or swelling. No visible canal hematoma. Disc levels: Degenerative disc disease most pronounced C6-7. No evidence for acute fracture. Upper chest: Emphysematous  changes. Other: There are 2 mildly prominent lower left cervical lymph nodes (image 66; series 8) measuring up to 10 mm, nonspecific however potentially reactive. IMPRESSION: No acute intracranial process. Atrophy and chronic microvascular ischemic changes. No acute cervical spine fracture. Electronically Signed   By: Lovey Newcomer M.D.   On: 12/21/2018 13:07   Ct Cervical Spine Wo Contrast  Result Date: 12/21/2018 CLINICAL DATA:  Patient status post fall. EXAM: CT HEAD WITHOUT CONTRAST CT CERVICAL SPINE WITHOUT CONTRAST TECHNIQUE: Multidetector CT imaging of the head and cervical spine was performed following the standard protocol without intravenous contrast. Multiplanar CT image reconstructions of the cervical spine were also generated. COMPARISON:  Brain CT October 03, 2014 FINDINGS: CT HEAD FINDINGS Brain: Ventricles and sulci are appropriate for patient's age. No evidence for acute cortically based infarct, intracranial hemorrhage, mass lesion or mass-effect. Unchanged remote infarct within the left centrum semiovale. Periventricular and subcortical white matter hypodensity compatible with chronic microvascular ischemic changes. Vascular: Unremarkable Skull: Intact. Sinuses/Orbits: Opacification of the left maxillary sinus Remainder of the paranasal sinuses are well aerated. Orbits are unremarkable. Other: None. CT CERVICAL SPINE FINDINGS Alignment: Normal anatomic alignment. Skull base and vertebrae: Intact. Soft tissues and spinal canal: No prevertebral fluid or swelling. No visible canal hematoma. Disc levels: Degenerative disc disease most pronounced C6-7. No evidence for acute fracture. Upper chest: Emphysematous changes. Other: There are 2 mildly prominent lower left cervical lymph nodes (image 66; series 8) measuring up to 10 mm, nonspecific however potentially reactive. IMPRESSION: No acute intracranial process. Atrophy and chronic microvascular ischemic changes. No acute cervical spine fracture.  Electronically Signed   By: Lovey Newcomer M.D.   On: 12/21/2018 13:07   US Arterial Abi (screening Lower Extremity)  Result Date: 12/21/2018 CLINICAL DATA:  Left BKA 2016.  Right lower extremity pain. EXAM: NONINVASIVE PHYSIOLOGIC VASCULAR STUDY OF BILATERAL LOWER EXTREMITIES TECHNIQUE: Evaluation of both lower extremities were performed at rest, including calculation of ankle-brachial indices with single level Doppler, pressure and pulse volume recording. COMPARISON:  02/11/2015 FINDINGS: Right ABI:  0.48 (previously 0.57  by report) Left ABI:  Non calculable Right Lower Extremity: Weak monophasic arterial waveforms at the distal calf Left Lower Extremity:  Prior BKA IMPRESSION: Severe progressive right lower extremity arterial occlusive disease at rest. Electronically Signed   By: Lucrezia Europe M.D.   On: 12/21/2018 14:14   Dg Chest Port 1 View  Result Date: 12/10/2018 CLINICAL DATA:  Shortness of breath. EXAM: PORTABLE CHEST 1 VIEW COMPARISON:  Sep 07, 2018 FINDINGS: Stable cardiomegaly. The cardiomediastinal silhouette is stable. No pneumothorax. No nodules or masses. No focal infiltrates or overt edema. IMPRESSION: No active disease. Electronically Signed   By: Dorise Bullion III M.D   On: 12/10/2018 13:31      Emeline Gins Arryn Terrones M.D on 12/25/2018 at 11:46 AM  Between 7am to 7pm - Pager - [phone]  After 7pm go to [email or web address] - password [facility]  [facility] -  Office  [phone]

## 2018-12-25 NOTE — Progress Notes (Addendum)
Progress Note  Patient Name: Scott [name] Date of Encounter: 12/25/2018  Primary Cardiologist: Minus Breeding, MD   Subjective   NO CP or SOB this am.  NO leg pain today  Inpatient Medications    Scheduled Meds: . aspirin EC  81 mg Oral Daily  . carvedilol  3.125 mg Oral BID WC  . donepezil  10 mg Oral QHS  . fluticasone  1 spray Each Nare QHS  . hydrALAZINE  25 mg Oral Q8H  . influenza vaccine adjuvanted  0.5 mL Intramuscular Tomorrow-1000  . isosorbide dinitrate  30 mg Oral BID  . mirabegron ER  25 mg Oral q morning - 10a  . sertraline  50 mg Oral q morning - 10a  . simvastatin  10 mg Oral QHS  . Warfarin - Pharmacist Dosing Inpatient   Does not apply q1800   Continuous Infusions: . sodium chloride 250 mL (12/24/18 1605)  . cefTRIAXone (ROCEPHIN)  IV Stopped (12/24/18 1640)  . vancomycin 1,000 mg (12/24/18 1742)   PRN Meds: sodium chloride, acetaminophen **OR** acetaminophen, ipratropium-albuterol, ondansetron **OR** ondansetron (ZOFRAN) IV   Vital Signs    Vitals:   12/24/18 1800 12/24/18 1944 12/25/18 0109 12/25/18 0520  BP: 137/75 122/74  (!) 186/95  Pulse: 90 78  73  Resp:  20  18  Temp:  98.2 F (36.8 C)  98.3 F (36.8 C)  TempSrc:  Oral  Oral  SpO2:  95% 95% 93%  Weight:    86.6 kg  Height:        Intake/Output Summary (Last 24 hours) at 12/25/2018 [id number] Last data filed at 12/25/2018 0400 Gross per 24 hour  Intake 903.9 ml  Output 325 ml  Net 578.9 ml   Last 3 Weights 12/25/2018 12/24/2018 12/23/2018  Weight (lbs) 191 lb 159 lb 13.3 oz 158 lb 8.2 oz  Weight (kg) 86.637 kg 72.5 kg 71.9 kg      Telemetry    NSR with occasional PVCs Personally Reviewed  ECG    No new EKG to review Personally Reviewed  Physical Exam   GEN: Well nourished, well developed in no acute distress HEENT: Normal NECK: No JVD; No carotid bruits LYMPHATICS: No lymphadenopathy CARDIAC:RRR, no murmurs, rubs, gallops RESPIRATORY:  Clear to auscultation without rales,  wheezing or rhonchi  ABDOMEN: Soft, non-tender, non-distended MUSCULOSKELETAL:  No edema; s/p left AKA SKIN: Warm and dry NEUROLOGIC:  Alert and oriented x 3 PSYCHIATRIC:  Normal affect     Labs    High Sensitivity Troponin:   Recent Labs  Lab 12/10/18 1249 12/10/18 1429 12/21/18 1209 12/21/18 1422 12/22/18 0837  TROPONINIHS 29* 28* 1,324* 1,350* 1,028*      Chemistry Recent Labs  Lab 12/20/18 1117  12/21/18 1209 12/22/18 0123 12/23/18 [id number] 12/24/18 0452 12/25/18 0642  NA 146*   < > 141 140 137 139 137  K 3.8  --  3.7 3.6 3.5 3.4* 4.3  CL 102  --  100 103 101 100 102  CO2 29  --  31 29 25 28 26   GLUCOSE 75   < > 89 123* 127* 97 114*  BUN 32   < > 35* 31* 31* 29* 27*  CREATININE 1.43*  --  1.69* 1.59* 1.92* 1.82* 1.69*  CALCIUM 9.1  --  8.9 8.7* 8.7* 8.9 8.8*  PROT 5.8*  --  6.5 5.8*  --   --   --   ALBUMIN 3.9  --  3.6 2.9*  --   --   --  AST 15  --  18 21  --   --   --   ALT 21  --  20 17  --   --   --   ALKPHOS 153*  --  122 107  --   --   --   BILITOT 0.3  --  1.2 1.0  --   --   --   GFRNONAA 41*  --  34* 36* 29* 31* 34*  GFRAA 48*  --  39* 42* 34* 36* 39*  ANIONGAP  --    < > 10 8 11 11 9    < > = values in this interval not displayed.     Hematology Recent Labs  Lab 12/22/18 0123 12/23/18 0638 12/24/18 0452  WBC 10.7* 11.4* 7.8  RBC 4.89 4.83 5.10  HGB 13.4 13.5 13.9  HCT 42.3 42.2 43.4  MCV 86.5 87.4 85.1  MCH 27.4 28.0 27.3  MCHC 31.7 32.0 32.0  RDW 15.7* 15.6* 15.4  PLT 139* 127* 136*    BNPNo results for input(s): BNP, PROBNP in the last 168 hours.   DDimer No results for input(s): DDIMER in the last 168 hours.   Radiology    No results found.  Cardiac Studies   2D Echo 12/11/18   1. The left ventricle has moderately reduced systolic function, with an ejection fraction of 35- 40%. The cavity size was normal. There is mildly increased left ventricular wall thickness. Left ventricular diastolic Doppler parameters are consistent  with impaired relaxation. Elevated left ventricular end-diastolic pressure. 2. There is akinesis of the left ventricular, apical anteroseptal wall. 3. There is akinesis of the left ventricular, basal-mid inferior wall. 4. The right ventricle has moderately reduced systolic function. The cavity was normal. There is no increase in right ventricular wall thickness. Right ventricular systolic pressure is severely elevated with an estimated pressure of 70.2 mmHg. 5. Left atrial size was mildly dilated. 6. The aortic valve is tricuspid. Moderate calcification of the aortic valve. Moderate aortic annular calcification noted. 7. The mitral valve is degenerative. Mild thickening of the mitral valve leaflet. Moderate calcification of the mitral valve leaflet. There is moderate mitral annular calcification present. 8. The tricuspid valve is grossly normal. 9. The aorta is normal unless otherwise noted.   Patient Profile     Scott [name] is a 85 y.o. male with a hx of NICM, PAF, h/o DVT, chronic anticoagulation w/ coumadin, chronic LBBB, HTN, h/o postural syncope related to diuretics, stage III CKD, ETOH abuse, bladder CA and PAD who is being seen today for the evaluation of elevated troponin (primary presentation was for foot pain). Initially seen in consultation by Dr. Harl Bowie at [facility] 9/10. Transferred to [facility] for further care.   Assessment & Plan    1. Elevated hs Troponin:   significant increase from baseline (28>>29 last month), now 1,324>>1.350>>1,028.   No recent anginal symptomatology.   Recent echo showed EF 35-40%. EF unchanged from prior study in 2018.  Continues to deny CP  Given age, multiple cormorbidities including CKD, he is an overall poor cath candidate, particulary in the absence of symptoms or clinical instability.  Continue ASA 81mg  daily, Carvedilol 3.125mg  BID, long acting nitrates and statin  2. Chronic Systolic HF/ NICM:   LHC in 2014 w/ normal coronaries. EF 35-40%  on recent echo, c/w prior studies.   Admitted 2 weeks ago with COPD exacerbation and pulmonary edema due to acute on chronic systolic HF.   Discharge weight 152 lbs  after diuresising 4.2 L.   He denies any SOB this am and lungs are clear on exam.    He has RLE edema but likely related to cellulitis.  He was on Lasix 40mg  PO BID but held due to bump in creatinine  I&Os appear incomplete  Creatinine continues to trend downward (1.92>1.82>1.69)  Weight appears inaccurate today  Will restart lasix 40mg  PO daily  Continue carvedilol 3.125mg  BID   Change Hydralazine and Imdur to Bidil 20-37.5mg  TID  3. Fever/leukocytosis/LE cellulitis:   due to LE cellulitis with sepsis physiology   On broad spectrum antibiotics, Vanc + rocephin   4. Claudication:   history of PAD, prior AKA.   Per Cardiology consult note 9/10, ABIs in ER at [facility] with significant right sided disease.   Appreciate Vascular consult   5. Acute on Chronic Kidney Disease:   Stage III CKD at baseline. Baseline SCr ~1.2-1.4.   Creatinine decreased today from 1.92>>1.82>>1.69  Restarting Lasix 40mg  daily today  Repeat BMET in am  6. HTN:  BP elevated this am at 158/37mmHg  Started carvedilol yesterday low dose  Change Hydralazine and imdur to Bidil 20-37.5mg  TID  7. PAF:   NSR on tele  INR 2.7 today  Continue warfarin and BB  8. H/o DVT:   INR therapeutic at 2.7 today  Continue warfarin  9. Bradycardia:   He had been on Toprol XL 50mg  at home and this was stopped due to bracydardia  Started on Carvedilol 3.125mg   for HF and HR remains mainly in the 70s   For questions or updates, please contact Donna Please consult [email or web address] for contact info under      Signed, Fransico Him, MD  12/25/2018, 8:22 AM

## 2018-12-26 ENCOUNTER — Inpatient Hospital Stay (HOSPITAL_COMMUNITY): Payer: Medicare Other

## 2018-12-26 DIAGNOSIS — C675 Malignant neoplasm of bladder neck: Secondary | ICD-10-CM

## 2018-12-26 LAB — CULTURE, BLOOD (ROUTINE X 2)
Culture: NO GROWTH
Culture: NO GROWTH
Special Requests: ADEQUATE

## 2018-12-26 LAB — CBC
HCT: 39.9 % (ref 39.0–52.0)
Hemoglobin: 13.5 g/dL (ref 13.0–17.0)
MCH: 28.4 pg (ref 26.0–34.0)
MCHC: 33.8 g/dL (ref 30.0–36.0)
MCV: 84 fL (ref 80.0–100.0)
Platelets: 176 10*3/uL (ref 150–400)
RBC: 4.75 MIL/uL (ref 4.22–5.81)
RDW: 15.1 % (ref 11.5–15.5)
WBC: 12.2 10*3/uL — ABNORMAL HIGH (ref 4.0–10.5)
nRBC: 0 % (ref 0.0–0.2)

## 2018-12-26 LAB — BASIC METABOLIC PANEL
Anion gap: 12 (ref 5–15)
BUN: 32 mg/dL — ABNORMAL HIGH (ref 8–23)
CO2: 25 mmol/L (ref 22–32)
Calcium: 9 mg/dL (ref 8.9–10.3)
Chloride: 102 mmol/L (ref 98–111)
Creatinine, Ser: 1.53 mg/dL — ABNORMAL HIGH (ref 0.61–1.24)
GFR calc Af Amer: 44 mL/min — ABNORMAL LOW (ref >60)
GFR calc non Af Amer: 38 mL/min — ABNORMAL LOW (ref >60)
Glucose, Bld: 121 mg/dL — ABNORMAL HIGH (ref 70–99)
Potassium: 4.3 mmol/L (ref 3.5–5.1)
Sodium: 139 mmol/L (ref 135–145)

## 2018-12-26 LAB — PROTIME-INR
INR: 2.9 — ABNORMAL HIGH (ref 0.8–1.2)
Prothrombin Time: 30.1 seconds — ABNORMAL HIGH (ref 11.4–15.2)

## 2018-12-26 LAB — PROCALCITONIN: Procalcitonin: 0.23 ng/mL

## 2018-12-26 MED ORDER — VANCOMYCIN HCL IN DEXTROSE 1-5 GM/200ML-% IV SOLN: 1000.0000 mg | INTRAVENOUS | Status: DC

## 2018-12-26 MED ORDER — CARVEDILOL 6.25 MG PO TABS: 6.2500 mg | ORAL_TABLET | Freq: Two times a day (BID) | ORAL | Status: DC

## 2018-12-26 MED ORDER — LACTINEX PO CHEW: 1.0000 | CHEWABLE_TABLET | Freq: Three times a day (TID) | ORAL | 0 refills | Status: DC

## 2018-12-26 MED ORDER — DOXYCYCLINE HYCLATE 100 MG PO TABS: 100.0000 mg | ORAL_TABLET | Freq: Two times a day (BID) | ORAL | 0 refills | Status: DC

## 2018-12-26 MED ORDER — CARVEDILOL 6.25 MG PO TABS: 6.2500 mg | ORAL_TABLET | Freq: Two times a day (BID) | ORAL | 0 refills | Status: DC

## 2018-12-26 MED ORDER — WARFARIN 0.5 MG HALF TABLET
0.5000 mg | ORAL_TABLET | Freq: Once | ORAL | Status: DC
  Filled 2018-12-26: qty 1

## 2018-12-26 MED ORDER — FUROSEMIDE 10 MG/ML IJ SOLN: 20.0000 mg | Freq: Once | INTRAMUSCULAR | Status: DC

## 2018-12-26 MED ORDER — ACETAMINOPHEN 325 MG PO TABS: 650.0000 mg | ORAL_TABLET | Freq: Four times a day (QID) | ORAL | Status: DC | PRN

## 2018-12-26 MED ORDER — CEPHALEXIN 500 MG PO CAPS: 500.0000 mg | ORAL_CAPSULE | Freq: Three times a day (TID) | ORAL | 0 refills | Status: AC

## 2018-12-26 MED ORDER — CARVEDILOL 3.125 MG PO TABS: 3.1250 mg | ORAL_TABLET | Freq: Once | ORAL | Status: DC

## 2018-12-26 MED ORDER — COLCHICINE 0.6 MG PO TABS: 0.6000 mg | ORAL_TABLET | Freq: Every day | ORAL | 0 refills | Status: DC

## 2018-12-26 MED ORDER — ISOSORB DINITRATE-HYDRALAZINE 20-37.5 MG PO TABS: 1.0000 | ORAL_TABLET | Freq: Three times a day (TID) | ORAL | 0 refills | Status: DC

## 2018-12-26 NOTE — Discharge Summary (Signed)
Scott [name], is a 85 y.o. male  DOB 1921-10-07  MRN [id number].  Admission date:  12/21/2018  Admitting Physician  Costin Karlyne Greenspan, MD  Discharge Date:  12/26/2018   Primary MD  Dettinger, Fransisca Kaufmann, MD  Recommendations for primary care physician for things to follow:  -Check CBC, BMP during next visit to ensure stable renal function   Admission Diagnosis  Ischemia [I99.8] Right leg pain [M79.604] Fever [R50.9] Fall, initial encounter [W19.XXXA] Fever, unspecified fever cause [R50.9]   Discharge Diagnosis  Ischemia [I99.8] Right leg pain [M79.604] Fever [R50.9] Fall, initial encounter [W19.XXXA] Fever, unspecified fever cause [R50.9]    Principal Problem:   Foot pain Active Problems:   Hyperlipidemia   CAD- non obstructive disease by cath 3/14   Bladder cancer (HCC)   Chronic combined systolic and diastolic heart failure (HCC)   NICM (nonischemic cardiomyopathy)- EF 20-25% by Echo 04/12/13   BPH (benign prostatic hyperplasia)   NSTEMI (non-ST elevated myocardial infarction) ([place])   PAD (peripheral artery disease) (HCC)   Essential hypertension   Chronic kidney disease (CKD) stage G3a/A1, moderately decreased glomerular filtration rate (GFR) between 45-59 mL/min/1.73 square meter and albuminuria creatinine ratio less than 30 mg/g (HCC)   Paroxysmal atrial fibrillation (HCC)   Alzheimer disease ([place])   Cellulitis      Past Medical History:  Diagnosis Date   Arthritis    "all over"   Atrial fibrillation ([place])    Benign localized prostatic hyperplasia with lower urinary tract symptoms (LUTS)    Bradycardia 11/28/2015   Severe-HR in the 20s to 14s following intubation for urological procedure.   CAD- non obstructive disease by cath 3/14 cardiologist-  dr hochrein   Chronic lower back pain    COPD (chronic obstructive pulmonary disease) ([place])    DDD (degenerative disc disease)      Depression    Dyspnea    w/ exertion and lying down (raise head)   ETOH abuse    GERD (gastroesophageal reflux disease)    Glaucoma    Glaucoma, both eyes    History of bladder cancer urologist-- dr Jeffie Pollock   first dx 2012--  s/p TURBT's   History of DVT of lower extremity 03/2013   bilateral    History of pulmonary embolus (PE) 04/2013   HTN (hypertension)    Hyperlipidemia    Hypertension    LBBB (left bundle branch block)    chronic   Non-ischemic cardiomyopathy ([place])    last echo 01-18-2017, ef 35-40%   NSVT (nonsustained ventricular tachycardia) ([place])    a. NSVT 06/2012; NSVT also seen during 03/2013 adm. b. Med rx. Not candidate for ICD given adv age.   PAD (peripheral artery disease) (HCC)    Popliteal artery aneurysm, bilateral (HCC)    DOCUMENTED CHRONIC PARTIAL OCCLUSION--  PT DENIES CLAUDICATION OR ANY OTHER SYMPTOMS   PVCs (premature ventricular contractions)    PVD (peripheral vascular disease) (HCC)    Right ABI .75, Left .78 (2006)   Syncope 06/2012   a.  Felt to be postural syncope related to diuretics 06/2012.   Systolic and diastolic CHF, chronic (HCC) cardiologsit-  dr hochrein   a. NICM - patent cors 06/2012, EF 40% at that time. b. 03/2013 eval: EF 20-25%.   Vision loss, left eye    due to glaucoma   Wears glasses     Past Surgical History:  Procedure Laterality Date   AMPUTATION Left 10/16/2014   Procedure: AMPUTATION ABOVE KNEE- LEFT;  Surgeon: Mal Misty, MD;  Location: [facility];  Service: Vascular;  Laterality: Left;   CARDIAC CATHETERIZATION  05-11-2004  DR [place] PLAQUE/ NORMAL LVF/ EF 55%/  NON-OBSTRUCTIVE LAD 25%   CARDIAC CATHETERIZATION  06/18/2012   dr hochrein   mild luminal irregularities of coronaries, ef 45-50%   CARDIOVASCULAR STRESS TEST  10-15-2010   LOW RISK NUCLEAR STUDY/ NO EVIDENCE OF ISCHEMIA/ NORMAL EF   CATARACT EXTRACTION W/ INTRAOCULAR LENS  IMPLANT, BILATERAL Bilateral     CYSTOSCOPY  10/07/2011   Procedure: CYSTOSCOPY;  Surgeon: Malka So, MD;  Location: [facility];  Service: Urology;  Laterality: N/A;   CYSTOSCOPY N/A 10/20/2018   Procedure: CYSTOSCOPY WITH FULGURATION OF PROSTATIC URETHRAL TUMOR;  Surgeon: [name], MD;  Location: [facility];  Service: Urology;  Laterality: N/A;   CYSTOSCOPY WITH BIOPSY  03/14/2012   Procedure: CYSTOSCOPY WITH BIOPSY;  Surgeon: Malka So, MD;  Location: [facility];  Service: Urology;  Laterality: N/A;  WITH FULGURATION   CYSTOSCOPY WITH BIOPSY N/A 12/09/2016   Procedure: CYSTOSCOPY WITH BIOPSY AND FULGURATION;  Surgeon: [name], MD;  Location: [facility];  Service: Urology;  Laterality: N/A;   CYSTOSCOPY WITH BIOPSY N/A 12/20/2017   Procedure: CYSTOSCOPY WITH BIOPSY WITH FULGURATION;  Surgeon: [name], MD;  Location: [facility];  Service: Urology;  Laterality: N/A;   CYSTOSCOPY WITH BIOPSY N/A 06/09/2018   Procedure: CYSTOSCOPY WITH BIOPSY AND FULGURATION;  Surgeon: [name], MD;  Location: [facility];  Service: Urology;  Laterality: N/A;   CYSTOSCOPY WITH FULGERATION N/A 01/20/2016   Procedure: CYSTOSCOPY, BIOPSY WITH FULGERATION OF URTHRAL TUMOR;  Surgeon: [name], MD;  Location[facility];  Service: Urology;  Laterality: N/A;   CYSTOSCOPY WITH FULGERATION N/A 06/01/2016   Procedure: CYSTOSCOPY WITH FULGERATION urethral tumors and bladder neck;  Surgeon: [name], MD;  Location: [facility];  Service: Urology;  Laterality: N/A;   SHOULDER SURGERY Left 1970's   TONSILLECTOMY     TRANSTHORACIC ECHOCARDIOGRAM  01-18-2017   dr hochrein   moderate LVH, ef 35-40%, diffuse hypokinesis/  trivial AR and MR/  mild TR   TRANSURETHRAL RESECTION OF BLADDER TUMOR  10/07/2011   Procedure: TRANSURETHRAL RESECTION OF BLADDER TUMOR (TURBT);  Surgeon: Malka So, MD;  Location: [facility];  Service: Urology;  Laterality: N/A;   TRANSURETHRAL RESECTION OF BLADDER TUMOR N/A 07/10/2015   Procedure: TRANSURETHRAL  RESECTION OF BLADDER TUMOR (TURBT), CYSTOSCOPY ;  Surgeon: [name], MD;  Location[facility];  Service: Urology;  Laterality: N/A;   TRANSURETHRAL RESECTION OF PROSTATE  03/14/2012   Procedure: TRANSURETHRAL RESECTION OF THE PROSTATE WITH GYRUS INSTRUMENTS;  Surgeon: Malka So, MD;  Location: [facility];  Service: Urology;  Laterality: N/A;       History of present illness and  Hospital Course:     Kindly see H&P for history of present illness and admission details, please review complete Labs, Consult reports and Test reports for all details in brief  HPI  from the history and physical done on the day of admission 12/21/2018  HPI: Scott [name] is a 85 y.o. 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 fib, hypertension, chronic kidney disease stage III, COPD, bladder cancer, CAD, left AKA who is being brought to the hospital by family due to mild confusion this morning.  They also report that last night, patient was found down caught in between the bed and the wall.  Patient denies any syncopal episodes and tells me he lost balance and fell.  He has a history of AKA but uses his right foot to pivot.  He denies any chest pain, denies any abdominal pain, nausea or vomiting.  He denies any fever or chills.  He does complain of right foot pain when he touch it but not at rest.  He is alert and oriented x4 however not forthcoming with answering questions.  The nephew, who is the main caregiver is at bedside and contributed to the story.  He was recently admitted to the hospital and discharge 10 days ago due to COPD exacerbation and pulmonary edema.  ED Course: In the emergency room patient is found to have a temp of 100.4, slightly tachypneic, the rest of the vital signs are stable. His blood work shows a creatinine of 1.69, high-sensitivity troponin 1324, lactic acid initially 1.7 on repeat 2.2.  He has a white count of 15.  Covid was  negative.  CT head and C-spine are negative.  Ankle x-ray negative. Chest x-ray without acute abnormalities, mild cardiomegaly and chronic interstitial lung changes.  He had an ABI given poor pulses on the right lower extremity which showed severe progressive arterial occlusive disease at rest.  Dr. Carlis Abbott with vascular surgery and Dr. Harl Bowie with cardiology were consulted.  Patient will be admitted to [facility] for vascular surgery to be able to consult on him.   Hospital Course   Elevated troponinin the setting of known coronary artery disease - significant increase from baseline (28>>29 last month), now 1,324>>1.350>>1,028, but overall trending down,. -Cardiology input greatly appreciated, given his age, multiple comorbidities, CKD, and overall poor health, he is currently a poor cath candidate, particularly in the absence of chest pain. -Continue with aspirin, statin, he will was started on Coreg and BiDil,(Imdur and metoprolol has been stopped)  as well he remains on warfarin  Right foot pain -This seems to be multifactorial, most like related to gout flare, and cellulitis, with underlying known significant PVD. -Patient was treated with broad-spectrum antibiotic coverage during hospital stay, procalcitonin trending down, he is afebrile, he will be discharged on doxycycline and Keflex. -As well patient having typical findings of gouty flare given his ankle has been intermittently swollen and red, this has significantly improved after starting colchicine and steroids, no NSAIDs on discharge given his renal disease, and he will be discharged on colchicine, he is already on Uloric.  Chronic combined systolic and diastolic CHF -Most recent EF 35-40% just the end of August 2020. Appears euvolemic. Resume home dose Lasix on discharge. -Metoprolol held initially secondary to bradycardia, resolved holding metoprolol, he is currently on Coreg, dose was increased per cardiology recommendation giving  16 beats of nonsustained V. tach overnight.  Continue with Coreg, BiDil on discharge, no ACE/ARB given CKD.   AKI on Chronic kidney disease stage III -Baseline creatinine between 1.2 and 1.5,  creatinine is 1.5 on discharge, patient will be resumed back on his home dose Lasix .  Peripheral vascular disease -Vascular surgery input greatly appreciated, patient does not have any wrist pain currently, ABIs down at 0.48, but was 0.57 in 2016, there is no indication for angiogram currently.  COPD -Recently admitted for COPD exacerbation, currently has no wheezing, stable, continue home medications.  Paroxysmal A. Fib -currentlyin sinus,  -on warfarin,  Hypertension -Please see above discussion  History of DVT/PE -On Coumadin at home  Dementia without behavioral disturbances -Continue Aricept, however patient alert and oriented x4 on my evaluation  Bladder cancer -Outpatient follow-up    Discharge Condition:  stable   Follow UP  Follow-up Information    Dettinger, Fransisca Kaufmann, MD.   Specialties: Family Medicine, Cardiology Contact information: [place] [id number] [facility].   Specialty: Home Health Services            Discharge Instructions  and  Discharge Medications     Discharge Instructions    Discharge instructions   Complete by: As directed    Follow with Primary MD Dettinger, Fransisca Kaufmann, MD in 7 days   Get CBC, CMP,  checked  by Primary MD next visit.    Activity: As tolerated with Full fall precautions use walker/cane & assistance as needed   Disposition Home   Diet: Heart Healthy, with 1500 cc fluid restrictions, with feeding assistance and aspiration precautions.  For Heart failure patients - Check your Weight same time everyday, if you gain over 2 pounds, or you develop in leg swelling, experience more shortness of breath or chest pain, call your Primary MD  immediately. Follow Cardiac Low Salt Diet and 1.5 lit/day fluid restriction.   On your next visit with your primary care physician please Get Medicines reviewed and adjusted.   Please request your Prim.MD to go over all Hospital Tests and Procedure/Radiological results at the follow up, please get all Hospital records sent to your Prim MD by signing hospital release before you go home.   If you experience worsening of your admission symptoms, develop shortness of breath, life threatening emergency, suicidal or homicidal thoughts you must seek medical attention immediately by calling 911 or calling your MD immediately  if symptoms less severe.  You Must read complete instructions/literature along with all the possible adverse reactions/side effects for all the Medicines you take and that have been prescribed to you. Take any new Medicines after you have completely understood and accpet all the possible adverse reactions/side effects.   Do not drive, operating heavy machinery, perform activities at heights, swimming or participation in water activities or provide baby sitting services if your were admitted for syncope or siezures until you have seen by Primary MD or a Neurologist and advised to do so again.  Do not drive when taking Pain medications.    Do not take more than prescribed Pain, Sleep and Anxiety Medications  Special Instructions: If you have smoked or chewed Tobacco  in the last 2 yrs please stop smoking, stop any regular Alcohol  and or any Recreational drug use.  Wear Seat belts while driving.   Please note  You were cared for by a hospitalist during your hospital stay. If you have any questions about your discharge medications or the care you received while you were in the hospital after you are discharged, you can call the unit and asked to speak with the hospitalist on call if the hospitalist that took care of you is not  available. Once you are discharged, your primary  care physician will handle any further medical issues. Please note that NO REFILLS for any discharge medications will be authorized once you are discharged, as it is imperative that you return to your primary care physician (or establish a relationship with a primary care physician if you do not have one) for your aftercare needs so that they can reassess your need for medications and monitor your lab values.   Increase activity slowly   Complete by: As directed      Allergies as of 12/26/2018      Reactions   Lipitor [atorvastatin] Other (See Comments)   Unknown rxn per pt      Medication List    STOP taking these medications   isosorbide dinitrate 30 MG tablet Commonly known as: ISORDIL   metoprolol succinate 50 MG 24 hr tablet Commonly known as: TOPROL-XL     TAKE these medications   acetaminophen 325 MG tablet Commonly known as: TYLENOL Take 2 tablets (650 mg total) by mouth every 6 (six) hours as needed for mild pain (or Fever >/= 101).   aspirin 81 MG EC tablet Take 1 tablet (81 mg total) by mouth daily.   carvedilol 6.25 MG tablet Commonly known as: Coreg Take 1 tablet (6.25 mg total) by mouth 2 (two) times daily.   cephALEXin 500 MG capsule Commonly known as: KEFLEX Take 1 capsule (500 mg total) by mouth 3 (three) times daily for 3 days.   colchicine 0.6 MG tablet Take 1 tablet (0.6 mg total) by mouth daily. 1 tablet oral daily for 10 days, then stop Start taking on: December 27, 2018   donepezil 10 MG tablet Commonly known as: ARICEPT TAKE ONE TABLET AT BEDTIME   doxycycline 100 MG tablet Commonly known as: VIBRA-TABS Take 1 tablet (100 mg total) by mouth 2 (two) times daily.   febuxostat 40 MG tablet Commonly known as: ULORIC TAKE 1 TABLET DAILY   fluticasone 50 MCG/ACT nasal spray Commonly known as: FLONASE Place 1 spray into both nostrils daily as needed for allergies or rhinitis. What changed: when to take this   folic acid 1 MG tablet Commonly  known as: FOLVITE TAKE 1 TABLET DAILY   furosemide 40 MG tablet Commonly known as: LASIX Take 1 tablet (40 mg total) by mouth 2 (two) times daily.   gabapentin 100 MG capsule Commonly known as: NEURONTIN TAKE 2 CAPSULES TWICE A DAY   ipratropium 0.02 % nebulizer solution Commonly known as: ATROVENT Take 0.5 mg by nebulization every 6 (six) hours as needed for wheezing or shortness of breath.   isosorbide-hydrALAZINE 20-37.5 MG tablet Commonly known as: BIDIL Take 1 tablet by mouth 3 (three) times daily.   lactobacillus acidophilus & bulgar chewable tablet Chew 1 tablet by mouth 3 (three) times daily with meals.   Myrbetriq 25 MG Tb24 tablet Generic drug: mirabegron ER Take 25 mg by mouth every morning.   pantoprazole 40 MG tablet Commonly known as: PROTONIX TAKE 1 TABLET DAILY   ProAir HFA 108 (90 Base) MCG/ACT inhaler Generic drug: albuterol 2 PUFFS EVERY 6 HOURS AS NEEDED FOR WHEEZING OR SHORTNESS OF BREATH What changed: See the new instructions.   albuterol (2.5 MG/3ML) 0.083% nebulizer solution Commonly known as: PROVENTIL NEBULIZE 1 VIAL EVERY 6 HOURS AS NEEDED FOR WHEEZING OR SHORTNESS OF BREATH What changed:   how much to take  how to take this  when to take this  reasons to take this  additional  instructions   sertraline 50 MG tablet Commonly known as: ZOLOFT TAKE 1 TABLET IN THE MORNING What changed: when to take this   simvastatin 10 MG tablet Commonly known as: ZOCOR Take 1 tablet (10 mg total) by mouth at bedtime.   warfarin 2.5 MG tablet Commonly known as: COUMADIN Take as directed. If you are unsure how to take this medication, talk to your nurse or doctor. Original instructions: Take 1-2 tablets (2.5-5 mg total) by mouth See admin instructions. Take 2.5 mg by mouth daily except Mondays and Fridays take 5 mg         Diet and Activity recommendation: See Discharge Instructions above   Consults obtained -  Vascular  surgery Cardiology   Major procedures and Radiology Reports - PLEASE review detailed and final reports for all details, in brief -      Dg Chest 1 View  Result Date: 12/21/2018 CLINICAL DATA:  Fever. Fell last night. Smoker. EXAM: CHEST  1 VIEW COMPARISON:  12/10/2018 and chest CT dated 07/04/2018 FINDINGS: Mildly enlarged cardiac silhouette with an interval decrease in size. Decreased prominence of the pulmonary vasculature. Stable mild prominence of the interstitial markings. No pleural fluid. Stable vascular tortuosity to the right of the upper mediastinum. Thoracic spine degenerative changes. IMPRESSION: No acute abnormality. Mild cardiomegaly and mild chronic interstitial lung disease. Electronically Signed   By: Claudie Revering M.D.   On: 12/21/2018 13:09   Dg Chest 2 View  Result Date: 12/26/2018 CLINICAL DATA:  85 year old male with wheezing. EXAM: CHEST - 2 VIEW COMPARISON:  Chest radiograph dated 12/21/2018 FINDINGS: Shallow inspiration. No focal consolidation, pleural effusion, or pneumothorax. There is cardiomegaly. Atherosclerotic calcification of the aorta. No acute osseous pathology. IMPRESSION: No active cardiopulmonary disease. Electronically Signed   By: Anner Crete M.D.   On: 12/26/2018 11:23   Dg Ankle Complete Right  Result Date: 12/21/2018 CLINICAL DATA:  Ankle pain after a fall last night. EXAM: RIGHT ANKLE - COMPLETE 3+ VIEW COMPARISON:  None. FINDINGS: There is no evidence of fracture, dislocation, or joint effusion. A plantar calcaneal enthesophyte is noted. Vascular calcifications are seen. Soft tissues are unremarkable. IMPRESSION: No acute osseous injury. Electronically Signed   By: Zerita Boers M.D.   On: 12/21/2018 13:09   Ct Head Wo Contrast  Result Date: 12/21/2018 CLINICAL DATA:  Patient status post fall. EXAM: CT HEAD WITHOUT CONTRAST CT CERVICAL SPINE WITHOUT CONTRAST TECHNIQUE: Multidetector CT imaging of the head and cervical spine was performed  following the standard protocol without intravenous contrast. Multiplanar CT image reconstructions of the cervical spine were also generated. COMPARISON:  Brain CT October 03, 2014 FINDINGS: CT HEAD FINDINGS Brain: Ventricles and sulci are appropriate for patient's age. No evidence for acute cortically based infarct, intracranial hemorrhage, mass lesion or mass-effect. Unchanged remote infarct within the left centrum semiovale. Periventricular and subcortical white matter hypodensity compatible with chronic microvascular ischemic changes. Vascular: Unremarkable Skull: Intact. Sinuses/Orbits: Opacification of the left maxillary sinus Remainder of the paranasal sinuses are well aerated. Orbits are unremarkable. Other: None. CT CERVICAL SPINE FINDINGS Alignment: Normal anatomic alignment. Skull base and vertebrae: Intact. Soft tissues and spinal canal: No prevertebral fluid or swelling. No visible canal hematoma. Disc levels: Degenerative disc disease most pronounced C6-7. No evidence for acute fracture. Upper chest: Emphysematous changes. Other: There are 2 mildly prominent lower left cervical lymph nodes (image 66; series 8) measuring up to 10 mm, nonspecific however potentially reactive. IMPRESSION: No acute intracranial process. Atrophy and chronic microvascular ischemic  changes. No acute cervical spine fracture. Electronically Signed   By: Lovey Newcomer M.D.   On: 12/21/2018 13:07   Ct Cervical Spine Wo Contrast  Result Date: 12/21/2018 CLINICAL DATA:  Patient status post fall. EXAM: CT HEAD WITHOUT CONTRAST CT CERVICAL SPINE WITHOUT CONTRAST TECHNIQUE: Multidetector CT imaging of the head and cervical spine was performed following the standard protocol without intravenous contrast. Multiplanar CT image reconstructions of the cervical spine were also generated. COMPARISON:  Brain CT October 03, 2014 FINDINGS: CT HEAD FINDINGS Brain: Ventricles and sulci are appropriate for patient's age. No evidence for acute  cortically based infarct, intracranial hemorrhage, mass lesion or mass-effect. Unchanged remote infarct within the left centrum semiovale. Periventricular and subcortical white matter hypodensity compatible with chronic microvascular ischemic changes. Vascular: Unremarkable Skull: Intact. Sinuses/Orbits: Opacification of the left maxillary sinus Remainder of the paranasal sinuses are well aerated. Orbits are unremarkable. Other: None. CT CERVICAL SPINE FINDINGS Alignment: Normal anatomic alignment. Skull base and vertebrae: Intact. Soft tissues and spinal canal: No prevertebral fluid or swelling. No visible canal hematoma. Disc levels: Degenerative disc disease most pronounced C6-7. No evidence for acute fracture. Upper chest: Emphysematous changes. Other: There are 2 mildly prominent lower left cervical lymph nodes (image 66; series 8) measuring up to 10 mm, nonspecific however potentially reactive. IMPRESSION: No acute intracranial process. Atrophy and chronic microvascular ischemic changes. No acute cervical spine fracture. Electronically Signed   By: Lovey Newcomer M.D.   On: 12/21/2018 13:07   US Arterial Abi (screening Lower Extremity)  Result Date: 12/21/2018 CLINICAL DATA:  Left BKA 2016.  Right lower extremity pain. EXAM: NONINVASIVE PHYSIOLOGIC VASCULAR STUDY OF BILATERAL LOWER EXTREMITIES TECHNIQUE: Evaluation of both lower extremities were performed at rest, including calculation of ankle-brachial indices with single level Doppler, pressure and pulse volume recording. COMPARISON:  02/11/2015 FINDINGS: Right ABI:  0.48 (previously 0.57 by report) Left ABI:  Non calculable Right Lower Extremity: Weak monophasic arterial waveforms at the distal calf Left Lower Extremity:  Prior BKA IMPRESSION: Severe progressive right lower extremity arterial occlusive disease at rest. Electronically Signed   By: Lucrezia Europe M.D.   On: 12/21/2018 14:14   Dg Chest Port 1 View  Result Date: 12/10/2018 CLINICAL DATA:   Shortness of breath. EXAM: PORTABLE CHEST 1 VIEW COMPARISON:  Sep 07, 2018 FINDINGS: Stable cardiomegaly. The cardiomediastinal silhouette is stable. No pneumothorax. No nodules or masses. No focal infiltrates or overt edema. IMPRESSION: No active disease. Electronically Signed   By: Dorise Bullion III M.D   On: 12/10/2018 13:31    Micro Results    Recent Results (from the past 240 hour(s))  Culture, blood (routine x 2)     Status: None   Collection Time: 12/21/18 12:22 PM   Specimen: BLOOD RIGHT ARM  Result Value Ref Range Status   Specimen Description BLOOD RIGHT ARM DRAWN BY RN  Final   Special Requests   Final    BOTTLES DRAWN AEROBIC AND ANAEROBIC Blood Culture adequate volume   Culture   Final    NO GROWTH 5 DAYS Performed at [facility], [address], [place], [place] [address]    Report Status 12/26/2018 FINAL  Final  SARS Coronavirus 2 [facility] order, Performed in [facility] hospital lab) Nasopharyngeal Nasopharyngeal Swab     Status: None   Collection Time: 12/21/18  1:39 PM   Specimen: Nasopharyngeal Swab  Result Value Ref Range Status   SARS Coronavirus 2 NEGATIVE NEGATIVE Final    Comment: (NOTE) If  result is NEGATIVE SARS-CoV-2 target nucleic acids are NOT DETECTED. The SARS-CoV-2 RNA is generally detectable in upper and lower  respiratory specimens during the acute phase of infection. The lowest  concentration of SARS-CoV-2 viral copies this assay can detect is 250  copies / mL. A negative result does not preclude SARS-CoV-2 infection  and should not be used as the sole basis for treatment or other  patient management decisions.  A negative result may occur with  improper specimen collection / handling, submission of specimen other  than nasopharyngeal swab, presence of viral mutation(s) within the  areas targeted by this assay, and inadequate number of viral copies  (<250 copies / mL). A negative result must be combined with clinical  observations, patient  history, and epidemiological information. If result is POSITIVE SARS-CoV-2 target nucleic acids are DETECTED. The SARS-CoV-2 RNA is generally detectable in upper and lower  respiratory specimens dur ing the acute phase of infection.  Positive  results are indicative of active infection with SARS-CoV-2.  Clinical  correlation with patient history and other diagnostic information is  necessary to determine patient infection status.  Positive results do  not rule out bacterial infection or co-infection with other viruses. If result is PRESUMPTIVE POSTIVE SARS-CoV-2 nucleic acids MAY BE PRESENT.   A presumptive positive result was obtained on the submitted specimen  and confirmed on repeat testing.  While 2019 novel coronavirus  (SARS-CoV-2) nucleic acids may be present in the submitted sample  additional confirmatory testing may be necessary for epidemiological  and / or clinical management purposes  to differentiate between  SARS-CoV-2 and other Sarbecovirus currently known to infect humans.  If clinically indicated additional testing with an alternate test  methodology [phone]) is advised. The SARS-CoV-2 RNA is generally  detectable in upper and lower respiratory sp ecimens during the acute  phase of infection. The expected result is Negative. Fact Sheet for Patients:  StrictlyIdeas.no Fact Sheet for Healthcare Providers: BankingDealers.co.za This test is not yet approved or cleared by the [place] FDA and has been authorized for detection and/or diagnosis of SARS-CoV-2 by FDA under an Emergency Use Authorization (EUA).  This EUA will remain in effect (meaning this test can be used) for the duration of the COVID-19 declaration under Section 564(b)(1) of the Act, 21 U.S.C. section 360bbb-3(b)(1), unless the authorization is terminated or revoked sooner. Performed at [facility], [address], [place], [place] [address]   Culture,  blood (routine x 2)     Status: None   Collection Time: 12/21/18  2:22 PM   Specimen: BLOOD LEFT WRIST  Result Value Ref Range Status   Specimen Description BLOOD LEFT WRIST  Final   Special Requests   Final    BOTTLES DRAWN AEROBIC ONLY Blood Culture results may not be optimal due to an inadequate volume of blood received in culture bottles   Culture   Final    NO GROWTH 5 DAYS Performed at [facility], [address], [place], [place] [address]    Report Status 12/26/2018 FINAL  Final       Today   Subjective:   Scott [name] today was seen and examined, denies any shortness of breath, no cough, no fever, no chills, orts intermittent lower right ankle pain but overall much improved .  Objective:   Blood pressure 135/77, pulse 72, temperature 98.3 F (36.8 C), temperature source Oral, resp. rate 18, height 5\' 6"  (1.676 m), weight 86.2 kg, SpO2 96 %.   Intake/Output Summary (  Last 24 hours) at 12/26/2018 1208 Last data filed at 12/26/2018 0900 Gross per 24 hour  Intake 720 ml  Output 901 ml  Net -181 ml    Exam Awake Alert, Oriented x 3, No new F.N deficits, Normal affec Symmetrical Chest wall movement, Good air movement bilaterally, CTAB RRR,No Gallops,Rubs or new Murmurs, No Parasternal Heave +ve B.Sounds, Abd Soft, Non tender,  No rebound -guarding or rigidity. No Cyanosis, Clubbing , mild trace edema in the right lower extremity, left AKA .  Data Review   CBC w Diff:  Lab Results  Component Value Date   WBC 12.2 (H) 12/26/2018   HGB 13.5 12/26/2018   HGB 13.9 12/20/2018   HGB 11.5 (L) 01/13/2011   HCT 39.9 12/26/2018   HCT 42.3 12/20/2018   HCT 33.7 (L) 01/13/2011   PLT 176 12/26/2018   PLT 194 12/20/2018   LYMPHOPCT 7 12/21/2018   LYMPHOPCT 13.8 (L) 01/13/2011   MONOPCT 6 12/21/2018   MONOPCT 11.5 01/13/2011   EOSPCT 0 12/21/2018   EOSPCT 1.0 01/13/2011   BASOPCT 0 12/21/2018   BASOPCT 0.6 01/13/2011    CMP:  Lab Results  Component Value Date    NA 139 12/26/2018   NA 146 (H) 12/20/2018   K 4.3 12/26/2018   CL 102 12/26/2018   CO2 25 12/26/2018   BUN 32 (H) 12/26/2018   BUN 32 12/20/2018   CREATININE 1.53 (H) 12/26/2018   CREATININE 0.91 11/02/2012   PROT 5.8 (L) 12/22/2018   PROT 5.8 (L) 12/20/2018   ALBUMIN 2.9 (L) 12/22/2018   ALBUMIN 3.9 12/20/2018   BILITOT 1.0 12/22/2018   BILITOT 0.3 12/20/2018   ALKPHOS 107 12/22/2018   AST 21 12/22/2018   ALT 17 12/22/2018  .   Total Time in preparing paper work, data evaluation and todays exam - 53 minutes  Phillips Climes M.D on 12/26/2018 at 12:08 PM  [facility]   Office  [phone]

## 2018-12-26 NOTE — Progress Notes (Signed)
Progress Note  Patient Name: Scott [name] Date of Encounter: 12/26/2018  Primary Cardiologist: Minus Breeding, MD   Subjective   NO CP or SOB.  Had 16 beat run of NSVT yesterday  Inpatient Medications    Scheduled Meds: . aspirin EC  81 mg Oral Daily  . carvedilol  3.125 mg Oral BID WC  . colchicine  0.6 mg Oral Daily  . donepezil  10 mg Oral QHS  . fluticasone  1 spray Each Nare QHS  . furosemide  40 mg Oral Daily  . influenza vaccine adjuvanted  0.5 mL Intramuscular Tomorrow-1000  . isosorbide-hydrALAZINE  1 tablet Oral TID  . methylPREDNISolone (SOLU-MEDROL) injection  60 mg Intravenous Q8H  . mirabegron ER  25 mg Oral q morning - 10a  . sertraline  50 mg Oral q morning - 10a  . simvastatin  10 mg Oral QHS  . Warfarin - Pharmacist Dosing Inpatient   Does not apply q1800   Continuous Infusions: . sodium chloride 250 mL (12/24/18 1605)  . cefTRIAXone (ROCEPHIN)  IV 1 g (12/25/18 1559)  . vancomycin 1,000 mg (12/26/18 0418)   PRN Meds: sodium chloride, acetaminophen **OR** acetaminophen, ipratropium-albuterol, ondansetron **OR** ondansetron (ZOFRAN) IV   Vital Signs    Vitals:   12/25/18 1821 12/25/18 2024 12/26/18 0404 12/26/18 0911  BP: (!) 153/80 (!) 143/79 (!) 156/92 132/70  Pulse: 76 69 75 87  Resp:  20 20 18   Temp:  97.7 F (36.5 C) 98.3 F (36.8 C)   TempSrc:  Oral Oral   SpO2: 94% 95% 100% 93%  Weight:   86.2 kg   Height:        Intake/Output Summary (Last 24 hours) at 12/26/2018 1025 Last data filed at 12/26/2018 0900 Gross per 24 hour  Intake 720 ml  Output 901 ml  Net -181 ml   Last 3 Weights 12/26/2018 12/25/2018 12/24/2018  Weight (lbs) 190 lb 191 lb 159 lb 13.3 oz  Weight (kg) 86.183 kg 86.637 kg 72.5 kg      Telemetry    NSR with PVCs and 16 beat run of NSVT yesterday and todayPersonally Reviewed  ECG    No new EKG to review Personally Reviewed  Physical Exam   GEN: Well nourished, well developed in no acute distress HEENT:  Normal NECK: No JVD; No carotid bruits LYMPHATICS: No lymphadenopathy CARDIAC:RRR, no murmurs, rubs, gallops RESPIRATORY:  Diffuse wheezing ABDOMEN: Soft, non-tender, non-distended MUSCULOSKELETAL:  No edema; No deformity  SKIN: Warm and dry NEUROLOGIC:  Alert and oriented x 3 PSYCHIATRIC:  Normal affect     Labs    High Sensitivity Troponin:   Recent Labs  Lab 12/10/18 1249 12/10/18 1429 12/21/18 1209 12/21/18 1422 12/22/18 0837  TROPONINIHS 29* 28* 1,324* 1,350* 1,028*      Chemistry Recent Labs  Lab 12/20/18 1117  12/21/18 1209 12/22/18 0123  12/25/18 0642 12/25/18 1910 12/26/18 0741  NA 146*   < > 141 140   < > 137 137 139  K 3.8  --  3.7 3.6   < > 4.3 4.4 4.3  CL 102  --  100 103   < > 102 102 102  CO2 29  --  31 29   < > 26 24 25   GLUCOSE 75   < > 89 123*   < > 114* 129* 121*  BUN 32   < > 35* 31*   < > 27* 31* 32*  CREATININE 1.43*  --  1.69* 1.59*   < >  1.69* 1.54* 1.53*  CALCIUM 9.1  --  8.9 8.7*   < > 8.8* 8.9 9.0  PROT 5.8*  --  6.5 5.8*  --   --   --   --   ALBUMIN 3.9  --  3.6 2.9*  --   --   --   --   AST 15  --  18 21  --   --   --   --   ALT 21  --  20 17  --   --   --   --   ALKPHOS 153*  --  122 107  --   --   --   --   BILITOT 0.3  --  1.2 1.0  --   --   --   --   GFRNONAA 41*  --  34* 36*   < > 34* 38* 38*  GFRAA 48*  --  39* 42*   < > 39* 44* 44*  ANIONGAP  --    < > 10 8   < > 9 11 12    < > = values in this interval not displayed.     Hematology Recent Labs  Lab 12/24/18 0452 12/25/18 0756 12/26/18 0741  WBC 7.8 10.7* 12.2*  RBC 5.10 4.86 4.75  HGB 13.9 13.5 13.5  HCT 43.4 41.3 39.9  MCV 85.1 85.0 84.0  MCH 27.3 27.8 28.4  MCHC 32.0 32.7 33.8  RDW 15.4 15.1 15.1  PLT 136* 145* 176    BNPNo results for input(s): BNP, PROBNP in the last 168 hours.   DDimer No results for input(s): DDIMER in the last 168 hours.   Radiology    No results found.  Cardiac Studies   2D Echo 12/11/18   1. The left ventricle has  moderately reduced systolic function, with an ejection fraction of 35- 40%. The cavity size was normal. There is mildly increased left ventricular wall thickness. Left ventricular diastolic Doppler parameters are consistent with impaired relaxation. Elevated left ventricular end-diastolic pressure. 2. There is akinesis of the left ventricular, apical anteroseptal wall. 3. There is akinesis of the left ventricular, basal-mid inferior wall. 4. The right ventricle has moderately reduced systolic function. The cavity was normal. There is no increase in right ventricular wall thickness. Right ventricular systolic pressure is severely elevated with an estimated pressure of 70.2 mmHg. 5. Left atrial size was mildly dilated. 6. The aortic valve is tricuspid. Moderate calcification of the aortic valve. Moderate aortic annular calcification noted. 7. The mitral valve is degenerative. Mild thickening of the mitral valve leaflet. Moderate calcification of the mitral valve leaflet. There is moderate mitral annular calcification present. 8. The tricuspid valve is grossly normal. 9. The aorta is normal unless otherwise noted.   Patient Profile     Scott [name] is a 85 y.o. male with a hx of NICM, PAF, h/o DVT, chronic anticoagulation w/ coumadin, chronic LBBB, HTN, h/o postural syncope related to diuretics, stage III CKD, ETOH abuse, bladder CA and PAD who is being seen today for the evaluation of elevated troponin (primary presentation was for foot pain). Initially seen in consultation by Dr. Harl Bowie at [facility] 9/10. Transferred to [facility] for further care.   Assessment & Plan    1. Elevated hs Troponin:   significant increase from baseline (28>>29 last month), now 1,324>>1.350>>1,028.   No recent anginal symptomatology.   Recent echo showed EF 35-40%. EF unchanged from prior study in 2018.  Continues to deny CP  Given age, multiple cormorbidities including CKD, he is an overall poor cath candidate,  particulary in the absence of symptoms or clinical instability.  Continue ASA 81mg  daily, Carvedilol, long acting nitrates and statin  2. Chronic Systolic HF/ NICM:   LHC in 2014 w/ normal coronaries. EF 35-40% on recent echo, c/w prior studies.   Admitted 2 weeks ago with COPD exacerbation and pulmonary edema due to acute on chronic systolic HF.   Discharge weight 152 lbs after diuresising 4.2 L.   Creatinine continues to trend downward (1.92>1.82>1.69>1.54>1.53)  Weight down 1lbs today  He put out 951cc yesterday and is net positive 393cc.  Continue carvedilol and Bidil  He has wheezing on exam today - get PA and Lat Cxray - may need to change to IV lasix if edema  3. Fever/leukocytosis/LE cellulitis:   due to LE cellulitis with sepsis physiology   On broad spectrum antibiotics, Vanc + rocephin   4. Claudication:   history of PAD, prior AKA.   Per Cardiology consult note 9/10, ABIs in ER at [facility] with significant right sided disease.   Appreciate Vascular consult   5. Acute on Chronic Kidney Disease:   Stage III CKD at baseline. Baseline SCr ~1.2-1.4.   Creatinine decreased today from 1.92>>1.82>>1.69>>1.54>>1.53  Continue PO Lasix  6. HTN:  BP stable at 132/36mmHg  Continue carvedilol - increase to 6.25mg  BID due to VT   Continue Bidil  7. PAF:   NSR on tele  INR 2.9 today  Continue warfarin and BB  8. H/o DVT:   INR therapeutic at 2.9 today  Continue warfarin  9. Bradycardia:   He had been on Toprol XL 50mg  at home and this was stopped due to bracydardia  Started on Carvedilol 3.125mg   for HF and HR remains mainly in the 70s.  Increasing Carvedilol to 6.25mg  BID for VT and follow on tele for bradycardia  10.  NSVT  Asymptomatic  Increase Carvedilol to 6.25mg  BID  Keep K+ and Mag repleted  I have spent a total of 35 minutes with patient reviewing notes , telemetry, EKGs, labs and examining patient as well as establishing an assessment  and plan that was discussed with the patient.  > 50% of time was spent in direct patient care.     For questions or updates, please contact [place] Please consult [email or web address] for contact info under      Signed, Fransico Him, MD  12/26/2018, 10:25 AM

## 2018-12-26 NOTE — Progress Notes (Signed)
ANTICOAGULATION CONSULT NOTE - Follow Up Consult  Pharmacy Consult for Coumadin + Vancomycin Indication: Afib, DVT, PVD, cellulits  Allergies  Allergen Reactions  . Lipitor [Atorvastatin] Other (See Comments)    Unknown rxn per pt    Patient Measurements: Height: 5\' 6"  (167.6 cm) Weight: 190 lb (86.2 kg) IBW/kg (Calculated) : 63.8  Vital Signs: Temp: 98.3 F (36.8 C) (09/15 1146) Temp Source: Oral (09/15 1146) BP: 135/77 (09/15 1146) Pulse Rate: 72 (09/15 1146)  Labs: Recent Labs    12/24/18 0452 12/25/18 0642 12/25/18 0756 12/25/18 1910 12/26/18 0741  HGB 13.9  --  13.5  --  13.5  HCT 43.4  --  41.3  --  39.9  PLT 136*  --  145*  --  176  LABPROT 25.8* 27.9*  --   --  30.1*  INR 2.4* 2.7*  --   --  2.9*  CREATININE 1.82* 1.69*  --  1.54* 1.53*    Estimated Creatinine Clearance: 29.7 mL/min (A) (by C-G formula based on SCr of 1.53 mg/dL (H)).  Assessment:   Anticoag: Warfarin PTA with h/o DVT, PAF, PVD. ACS/STEMI. Elevated troponin on admission. IV hep>warfarin. INR 2.9 trending up. CBC WNL PTA dosing: 2.5mg  daily, except mon/fri take 5mg   ID: R-foot cellulitis +/- gout flare in ankle: Afebrile.WBC 12.2 up. PC down to 0.35.  Vanco 9/10 >>  CTX 9/10 >>  9/10 BCx: negative  Vancomycin 1000mg  IV every 36h (calc AUC 550, Scr 1.82)  9/15: Vanco 1g IV q 48h (AUC 445.6, Scr 1.53, VD 0.5 for BMI 30)  Goal of Therapy:  INR 2-3 Monitor platelets by anticoagulation protocol: Yes  Vanco AUC 400-550   Plan:  Give warfarin 0.5mg  PO x1 tonight Daily INR, s/s bleeding  - Change Vancomycin to 1g IV q 48h - Ctx 1g IV q 24h    Lisette Mancebo S. Alford Highland, PharmD, BCPS Clinical Staff Pharmacist Eilene Ghazi Stillinger 12/26/2018,11:57 AM

## 2018-12-26 NOTE — Discharge Instructions (Signed)
Follow with Primary MD Dettinger, Fransisca Kaufmann, MD in 7 days   Get CBC, CMP,  checked  by Primary MD next visit.    Activity: As tolerated with Full fall precautions use walker/cane & assistance as needed   Disposition Home   Diet: Heart Healthy, with 1500 cc fluid restrictions, with feeding assistance and aspiration precautions.  For Heart failure patients - Check your Weight same time everyday, if you gain over 2 pounds, or you develop in leg swelling, experience more shortness of breath or chest pain, call your Primary MD immediately. Follow Cardiac Low Salt Diet and 1.5 lit/day fluid restriction.   On your next visit with your primary care physician please Get Medicines reviewed and adjusted.   Please request your Prim.MD to go over all Hospital Tests and Procedure/Radiological results at the follow up, please get all Hospital records sent to your Prim MD by signing hospital release before you go home.   If you experience worsening of your admission symptoms, develop shortness of breath, life threatening emergency, suicidal or homicidal thoughts you must seek medical attention immediately by calling 911 or calling your MD immediately  if symptoms less severe.  You Must read complete instructions/literature along with all the possible adverse reactions/side effects for all the Medicines you take and that have been prescribed to you. Take any new Medicines after you have completely understood and accpet all the possible adverse reactions/side effects.   Do not drive, operating heavy machinery, perform activities at heights, swimming or participation in water activities or provide baby sitting services if your were admitted for syncope or siezures until you have seen by Primary MD or a Neurologist and advised to do so again.  Do not drive when taking Pain medications.    Do not take more than prescribed Pain, Sleep and Anxiety Medications  Special Instructions: If you have smoked or  chewed Tobacco  in the last 2 yrs please stop smoking, stop any regular Alcohol  and or any Recreational drug use.  Wear Seat belts while driving.   Please note  You were cared for by a hospitalist during your hospital stay. If you have any questions about your discharge medications or the care you received while you were in the hospital after you are discharged, you can call the unit and asked to speak with the hospitalist on call if the hospitalist that took care of you is not available. Once you are discharged, your primary care physician will handle any further medical issues. Please note that NO REFILLS for any discharge medications will be authorized once you are discharged, as it is imperative that you return to your primary care physician (or establish a relationship with a primary care physician if you do not have one) for your aftercare needs so that they can reassess your need for medications and monitor your lab values.

## 2018-12-26 NOTE — Consult Note (Signed)
   [facility] CM Inpatient Consult   12/26/2018  Scott [name] 02/05/1922 [id number]  Patient screened for extreme high risk score for unplanned readmission score [38%] for less than 30 day admission hospitalizations in the Medicare ACO.   Review of patient's medical record reveals patient is: Brief Narrative    85 y.o.malewith medical history significant ofchronic combined CHF, PE/DVT on chronic anticoagulation, paroxysmal A. fib, hypertension, chronic kidney disease stage III, COPD, bladder cancer, CAD, left AKA who is being brought to the hospital by family due to mild confusion this morning. They also report that last night, patient was found down caught in between the bed and the wall. Patient denies any syncopal episodes and tells me he lost balance and fell. He has a history of AKA but uses his right foot to pivot.   Primary Care Provider is: Dettinger, Fransisca Kaufmann, MD, Josie Saunders, this office is an Warden/ranger in [facility].  Plan:  Will notify the Embedded team of the patient's hospitalization and high risk. Patient is for home with home health care, will have COPD EMMI follow up at [phone] to nephew Remo Lipps.   Please place a [facility] Care Management consult as appropriate and for questions contact:   Natividad Brood, RN BSN [facility] Liaison  [phone] business mobile phone Toll free office [phone]  Fax number: [phone] [email or web address]

## 2018-12-27 DIAGNOSIS — L03115 Cellulitis of right lower limb: Secondary | ICD-10-CM | POA: Diagnosis not present

## 2018-12-27 DIAGNOSIS — I2510 Atherosclerotic heart disease of native coronary artery without angina pectoris: Secondary | ICD-10-CM | POA: Diagnosis not present

## 2018-12-27 DIAGNOSIS — N400 Enlarged prostate without lower urinary tract symptoms: Secondary | ICD-10-CM | POA: Diagnosis not present

## 2018-12-27 DIAGNOSIS — I447 Left bundle-branch block, unspecified: Secondary | ICD-10-CM | POA: Diagnosis not present

## 2018-12-27 DIAGNOSIS — J449 Chronic obstructive pulmonary disease, unspecified: Secondary | ICD-10-CM | POA: Diagnosis not present

## 2018-12-27 DIAGNOSIS — N183 Chronic kidney disease, stage 3 (moderate): Secondary | ICD-10-CM | POA: Diagnosis not present

## 2018-12-27 DIAGNOSIS — F0280 Dementia in other diseases classified elsewhere without behavioral disturbance: Secondary | ICD-10-CM | POA: Diagnosis not present

## 2018-12-27 DIAGNOSIS — H409 Unspecified glaucoma: Secondary | ICD-10-CM | POA: Diagnosis not present

## 2018-12-27 DIAGNOSIS — M150 Primary generalized (osteo)arthritis: Secondary | ICD-10-CM | POA: Diagnosis not present

## 2018-12-27 DIAGNOSIS — I428 Other cardiomyopathies: Secondary | ICD-10-CM | POA: Diagnosis not present

## 2018-12-27 DIAGNOSIS — I739 Peripheral vascular disease, unspecified: Secondary | ICD-10-CM | POA: Diagnosis not present

## 2018-12-27 DIAGNOSIS — H5462 Unqualified visual loss, left eye, normal vision right eye: Secondary | ICD-10-CM | POA: Diagnosis not present

## 2018-12-27 DIAGNOSIS — I480 Paroxysmal atrial fibrillation: Secondary | ICD-10-CM | POA: Diagnosis not present

## 2018-12-27 DIAGNOSIS — E785 Hyperlipidemia, unspecified: Secondary | ICD-10-CM | POA: Diagnosis not present

## 2018-12-27 DIAGNOSIS — I252 Old myocardial infarction: Secondary | ICD-10-CM | POA: Diagnosis not present

## 2018-12-27 DIAGNOSIS — I130 Hypertensive heart and chronic kidney disease with heart failure and stage 1 through stage 4 chronic kidney disease, or unspecified chronic kidney disease: Secondary | ICD-10-CM | POA: Diagnosis not present

## 2018-12-27 DIAGNOSIS — I5042 Chronic combined systolic (congestive) and diastolic (congestive) heart failure: Secondary | ICD-10-CM | POA: Diagnosis not present

## 2018-12-27 DIAGNOSIS — K219 Gastro-esophageal reflux disease without esophagitis: Secondary | ICD-10-CM | POA: Diagnosis not present

## 2018-12-27 DIAGNOSIS — M50323 Other cervical disc degeneration at C6-C7 level: Secondary | ICD-10-CM | POA: Diagnosis not present

## 2018-12-27 DIAGNOSIS — J849 Interstitial pulmonary disease, unspecified: Secondary | ICD-10-CM | POA: Diagnosis not present

## 2018-12-27 DIAGNOSIS — F329 Major depressive disorder, single episode, unspecified: Secondary | ICD-10-CM | POA: Diagnosis not present

## 2018-12-27 DIAGNOSIS — Z9181 History of falling: Secondary | ICD-10-CM | POA: Diagnosis not present

## 2018-12-27 DIAGNOSIS — M109 Gout, unspecified: Secondary | ICD-10-CM | POA: Diagnosis not present

## 2018-12-27 DIAGNOSIS — M898X8 Other specified disorders of bone, other site: Secondary | ICD-10-CM | POA: Diagnosis not present

## 2018-12-27 DIAGNOSIS — G309 Alzheimer's disease, unspecified: Secondary | ICD-10-CM | POA: Diagnosis not present

## 2018-12-28 DIAGNOSIS — I252 Old myocardial infarction: Secondary | ICD-10-CM | POA: Diagnosis not present

## 2018-12-28 DIAGNOSIS — M150 Primary generalized (osteo)arthritis: Secondary | ICD-10-CM | POA: Diagnosis not present

## 2018-12-28 DIAGNOSIS — M109 Gout, unspecified: Secondary | ICD-10-CM | POA: Diagnosis not present

## 2018-12-28 DIAGNOSIS — I739 Peripheral vascular disease, unspecified: Secondary | ICD-10-CM | POA: Diagnosis not present

## 2018-12-28 DIAGNOSIS — I2510 Atherosclerotic heart disease of native coronary artery without angina pectoris: Secondary | ICD-10-CM | POA: Diagnosis not present

## 2018-12-28 DIAGNOSIS — L03115 Cellulitis of right lower limb: Secondary | ICD-10-CM | POA: Diagnosis not present

## 2018-12-28 DIAGNOSIS — I4821 Permanent atrial fibrillation: Secondary | ICD-10-CM | POA: Diagnosis not present

## 2018-12-29 ENCOUNTER — Other Ambulatory Visit: Payer: Self-pay

## 2018-12-29 ENCOUNTER — Telehealth: Payer: Self-pay | Admitting: *Deleted

## 2018-12-29 DIAGNOSIS — I739 Peripheral vascular disease, unspecified: Secondary | ICD-10-CM | POA: Diagnosis not present

## 2018-12-29 DIAGNOSIS — Z86711 Personal history of pulmonary embolism: Secondary | ICD-10-CM

## 2018-12-29 DIAGNOSIS — I2510 Atherosclerotic heart disease of native coronary artery without angina pectoris: Secondary | ICD-10-CM | POA: Diagnosis not present

## 2018-12-29 DIAGNOSIS — L03115 Cellulitis of right lower limb: Secondary | ICD-10-CM | POA: Diagnosis not present

## 2018-12-29 DIAGNOSIS — M150 Primary generalized (osteo)arthritis: Secondary | ICD-10-CM | POA: Diagnosis not present

## 2018-12-29 DIAGNOSIS — M109 Gout, unspecified: Secondary | ICD-10-CM | POA: Diagnosis not present

## 2018-12-29 DIAGNOSIS — I252 Old myocardial infarction: Secondary | ICD-10-CM | POA: Diagnosis not present

## 2018-12-29 DIAGNOSIS — I4891 Unspecified atrial fibrillation: Secondary | ICD-10-CM

## 2018-12-29 NOTE — Telephone Encounter (Signed)
Aware of dosage  

## 2018-12-29 NOTE — Telephone Encounter (Signed)
Fax received mdINR PT/INR self testing service Test date/time 12/28/18 4:55 pm INR 2.9

## 2018-12-29 NOTE — Telephone Encounter (Signed)
Description   Continue taking 5 mg(2 pills) on Mondays and Fridays and 2.5 mg (1 pill) the rest of the week  INR today is 2.9  (goal is 2-3)  Perfect reading today Recheck in 4 weeks     Caryl Pina, MD [place] 12/29/2018, 1:47 PM

## 2019-01-01 ENCOUNTER — Ambulatory Visit (INDEPENDENT_AMBULATORY_CARE_PROVIDER_SITE_OTHER): Payer: Medicare Other | Admitting: Family Medicine

## 2019-01-01 ENCOUNTER — Encounter: Payer: Self-pay | Admitting: Family Medicine

## 2019-01-01 DIAGNOSIS — M79671 Pain in right foot: Secondary | ICD-10-CM | POA: Diagnosis not present

## 2019-01-01 DIAGNOSIS — M109 Gout, unspecified: Secondary | ICD-10-CM | POA: Diagnosis not present

## 2019-01-01 DIAGNOSIS — I739 Peripheral vascular disease, unspecified: Secondary | ICD-10-CM | POA: Diagnosis not present

## 2019-01-01 DIAGNOSIS — I2510 Atherosclerotic heart disease of native coronary artery without angina pectoris: Secondary | ICD-10-CM | POA: Diagnosis not present

## 2019-01-01 DIAGNOSIS — M150 Primary generalized (osteo)arthritis: Secondary | ICD-10-CM | POA: Diagnosis not present

## 2019-01-01 DIAGNOSIS — I252 Old myocardial infarction: Secondary | ICD-10-CM | POA: Diagnosis not present

## 2019-01-01 DIAGNOSIS — L03115 Cellulitis of right lower limb: Secondary | ICD-10-CM | POA: Diagnosis not present

## 2019-01-01 MED ORDER — HYDROCODONE-ACETAMINOPHEN 5-325 MG PO TABS: 1.0000 | ORAL_TABLET | Freq: Two times a day (BID) | ORAL | 0 refills | Status: DC | PRN

## 2019-01-01 NOTE — Progress Notes (Signed)
Virtual Visit via telephone Note  I connected with Scott [name] on 01/01/19 at 1700 by telephone and verified that I am speaking with the correct person using two identifiers. Scott [name] is currently located at home and son are currently with her during visit. The provider, Fransisca Kaufmann Dettinger, MD is located in their office at time of visit.  Call ended at 1730  I discussed the limitations, risks, security and privacy concerns of performing an evaluation and management service by telephone and the availability of in person appointments. I also discussed with the patient that there may be a patient responsible charge related to this service. The patient expressed understanding and agreed to proceed.   History and Present Illness: Patient is calling in for a hospital for CHF and is calling in for a hospital f/u.  He was on keflex and doxycycline and is about to finish them up in one more day.  He also taking a probiotic. He is on 1569ml fluid restriction. Patient had a fall and foot pain and it looked like cellulitis. He had a fall and foot pain. His heart rate was elevated in the 130's. he also had aki and were concerned that his troponin was elevated and they wanted to transferred to [facility], they were hesitant about cath and they did a doppler on his circulation.  In his echo his EF was lower. His foot was darker and the pain is worsened since leaving the hospital.  If he puts weight on it then the pain worsens.   No diagnosis found.  Outpatient Encounter Medications as of 01/01/2019  Medication Sig  . acetaminophen (TYLENOL) 325 MG tablet Take 2 tablets (650 mg total) by mouth every 6 (six) hours as needed for mild pain (or Fever >/= 101).  Marland Kitchen albuterol (PROVENTIL) (2.5 MG/3ML) 0.083% nebulizer solution NEBULIZE 1 VIAL EVERY 6 HOURS AS NEEDED FOR WHEEZING OR SHORTNESS OF BREATH (Patient taking differently: Take 2.5 mg by nebulization every 6 (six) hours as needed for wheezing or shortness of  breath. )  . aspirin EC 81 MG EC tablet Take 1 tablet (81 mg total) by mouth daily.  . carvedilol (COREG) 6.25 MG tablet Take 1 tablet (6.25 mg total) by mouth 2 (two) times daily.  . colchicine 0.6 MG tablet Take 1 tablet (0.6 mg total) by mouth daily. 1 tablet oral daily for 10 days, then stop  . donepezil (ARICEPT) 10 MG tablet TAKE ONE TABLET AT BEDTIME (Patient taking differently: Take 10 mg by mouth at bedtime. )  . doxycycline (VIBRA-TABS) 100 MG tablet Take 1 tablet (100 mg total) by mouth 2 (two) times daily.  . febuxostat (ULORIC) 40 MG tablet TAKE 1 TABLET DAILY (Patient taking differently: Take 40 mg by mouth daily. )  . fluticasone (FLONASE) 50 MCG/ACT nasal spray Place 1 spray into both nostrils daily as needed for allergies or rhinitis. (Patient taking differently: Place 1 spray into both nostrils at bedtime. )  . folic acid (FOLVITE) 1 MG tablet TAKE 1 TABLET DAILY  . furosemide (LASIX) 40 MG tablet Take 1 tablet (40 mg total) by mouth 2 (two) times daily.  Marland Kitchen gabapentin (NEURONTIN) 100 MG capsule TAKE 2 CAPSULES TWICE A DAY (Patient taking differently: Take 200 mg by mouth 2 (two) times daily. )  . ipratropium (ATROVENT) 0.02 % nebulizer solution Take 0.5 mg by nebulization every 6 (six) hours as needed for wheezing or shortness of breath.  . isosorbide-hydrALAZINE (BIDIL) 20-37.5 MG tablet Take 1 tablet  by mouth 3 (three) times daily.  Marland Kitchen lactobacillus acidophilus & bulgar (LACTINEX) chewable tablet Chew 1 tablet by mouth 3 (three) times daily with meals.  Marland Kitchen MYRBETRIQ 25 MG TB24 tablet Take 25 mg by mouth every morning.   . pantoprazole (PROTONIX) 40 MG tablet TAKE 1 TABLET DAILY (Patient taking differently: Take 40 mg by mouth daily. )  . PROAIR HFA 108 (90 Base) MCG/ACT inhaler 2 PUFFS EVERY 6 HOURS AS NEEDED FOR WHEEZING OR SHORTNESS OF BREATH (Patient taking differently: Inhale 2 puffs into the lungs every 6 (six) hours as needed for wheezing or shortness of breath. )  .  sertraline (ZOLOFT) 50 MG tablet TAKE 1 TABLET IN THE MORNING (Patient taking differently: Take 50 mg by mouth daily. )  . simvastatin (ZOCOR) 10 MG tablet Take 1 tablet (10 mg total) by mouth at bedtime.  Marland Kitchen warfarin (COUMADIN) 2.5 MG tablet Take 1-2 tablets (2.5-5 mg total) by mouth See admin instructions. Take 2.5 mg by mouth daily except Mondays and Fridays take 5 mg   No facility-administered encounter medications on file as of 01/01/2019.     Review of Systems  Constitutional: Negative for chills and fever.  Respiratory: Negative for shortness of breath and wheezing.   Cardiovascular: Negative for chest pain and leg swelling.  Musculoskeletal: Positive for arthralgias. Negative for back pain and gait problem.  Skin: Negative for color change, rash and wound.  Neurological: Negative for dizziness, weakness and light-headedness.  All other systems reviewed and are negative.   Observations/Objective: Patient sounds comfortable and in no acute stents  Assessment and Plan: Problem List Items Addressed This Visit      Other   Foot pain   Relevant Medications   HYDROcodone-acetaminophen (NORCO/VICODIN) 5-325 MG tablet   Other Relevant Orders   Ambulatory referral to Vascular Surgery    Other Visit Diagnoses    Severe peripheral arterial disease ([place])    -  Primary   Relevant Medications   HYDROcodone-acetaminophen (NORCO/VICODIN) 5-325 MG tablet   Other Relevant Orders   Ambulatory referral to Vascular Surgery       Follow Up Instructions: Follow up in 4 weeks televisit Urgent referral to vascular  Filled out new DNR form for the patient   I discussed the assessment and treatment plan with the patient. The patient was provided an opportunity to ask questions and all were answered. The patient agreed with the plan and demonstrated an understanding of the instructions.   The patient was advised to call back or seek an in-person evaluation if the symptoms worsen or if the  condition fails to improve as anticipated.  The above assessment and management plan was discussed with the patient. The patient verbalized understanding of and has agreed to the management plan. Patient is aware to call the clinic if symptoms persist or worsen. Patient is aware when to return to the clinic for a follow-up visit. Patient educated on when it is appropriate to go to the emergency department.    I provided 30 minutes of non-face-to-face time during this encounter.    Worthy Rancher, MD

## 2019-01-02 ENCOUNTER — Other Ambulatory Visit: Payer: Self-pay | Admitting: Family Medicine

## 2019-01-02 DIAGNOSIS — M109 Gout, unspecified: Secondary | ICD-10-CM | POA: Diagnosis not present

## 2019-01-02 DIAGNOSIS — I739 Peripheral vascular disease, unspecified: Secondary | ICD-10-CM | POA: Diagnosis not present

## 2019-01-02 DIAGNOSIS — M150 Primary generalized (osteo)arthritis: Secondary | ICD-10-CM | POA: Diagnosis not present

## 2019-01-02 DIAGNOSIS — I2510 Atherosclerotic heart disease of native coronary artery without angina pectoris: Secondary | ICD-10-CM | POA: Diagnosis not present

## 2019-01-02 DIAGNOSIS — I252 Old myocardial infarction: Secondary | ICD-10-CM | POA: Diagnosis not present

## 2019-01-02 DIAGNOSIS — I5042 Chronic combined systolic (congestive) and diastolic (congestive) heart failure: Secondary | ICD-10-CM

## 2019-01-02 DIAGNOSIS — L03115 Cellulitis of right lower limb: Secondary | ICD-10-CM | POA: Diagnosis not present

## 2019-01-03 ENCOUNTER — Telehealth: Payer: Self-pay | Admitting: Family Medicine

## 2019-01-03 NOTE — Telephone Encounter (Signed)
Spoke with Patient - he is aware his Ref is in review at [facility] and Vascular & that their office will him to make him an appt.

## 2019-01-04 DIAGNOSIS — I252 Old myocardial infarction: Secondary | ICD-10-CM | POA: Diagnosis not present

## 2019-01-04 DIAGNOSIS — M109 Gout, unspecified: Secondary | ICD-10-CM | POA: Diagnosis not present

## 2019-01-04 DIAGNOSIS — I2510 Atherosclerotic heart disease of native coronary artery without angina pectoris: Secondary | ICD-10-CM | POA: Diagnosis not present

## 2019-01-04 DIAGNOSIS — L03115 Cellulitis of right lower limb: Secondary | ICD-10-CM | POA: Diagnosis not present

## 2019-01-04 DIAGNOSIS — M150 Primary generalized (osteo)arthritis: Secondary | ICD-10-CM | POA: Diagnosis not present

## 2019-01-04 DIAGNOSIS — I739 Peripheral vascular disease, unspecified: Secondary | ICD-10-CM | POA: Diagnosis not present

## 2019-01-08 ENCOUNTER — Telehealth: Payer: Self-pay | Admitting: Family Medicine

## 2019-01-08 ENCOUNTER — Emergency Department (HOSPITAL_COMMUNITY)
Admission: EM | Admit: 2019-01-08 | Discharge: 2019-01-09 | Discharge status: Against Medical Advice | Payer: Medicare Other | Attending: Emergency Medicine | Admitting: Emergency Medicine

## 2019-01-08 ENCOUNTER — Other Ambulatory Visit: Payer: Self-pay

## 2019-01-08 ENCOUNTER — Encounter (HOSPITAL_COMMUNITY): Payer: Self-pay | Admitting: *Deleted

## 2019-01-08 DIAGNOSIS — L03115 Cellulitis of right lower limb: Secondary | ICD-10-CM | POA: Diagnosis not present

## 2019-01-08 DIAGNOSIS — I739 Peripheral vascular disease, unspecified: Secondary | ICD-10-CM | POA: Diagnosis not present

## 2019-01-08 DIAGNOSIS — Z5321 Procedure and treatment not carried out due to patient leaving prior to being seen by health care provider: Secondary | ICD-10-CM | POA: Diagnosis not present

## 2019-01-08 DIAGNOSIS — M109 Gout, unspecified: Secondary | ICD-10-CM | POA: Diagnosis not present

## 2019-01-08 DIAGNOSIS — I4891 Unspecified atrial fibrillation: Secondary | ICD-10-CM

## 2019-01-08 DIAGNOSIS — Z86711 Personal history of pulmonary embolism: Secondary | ICD-10-CM

## 2019-01-08 DIAGNOSIS — L539 Erythematous condition, unspecified: Secondary | ICD-10-CM | POA: Diagnosis present

## 2019-01-08 DIAGNOSIS — I252 Old myocardial infarction: Secondary | ICD-10-CM | POA: Diagnosis not present

## 2019-01-08 DIAGNOSIS — I2510 Atherosclerotic heart disease of native coronary artery without angina pectoris: Secondary | ICD-10-CM | POA: Diagnosis not present

## 2019-01-08 DIAGNOSIS — M150 Primary generalized (osteo)arthritis: Secondary | ICD-10-CM | POA: Diagnosis not present

## 2019-01-08 LAB — COMPREHENSIVE METABOLIC PANEL
ALT: 19 U/L (ref 0–44)
AST: 27 U/L (ref 15–41)
Albumin: 3.6 g/dL (ref 3.5–5.0)
Alkaline Phosphatase: 174 U/L — ABNORMAL HIGH (ref 38–126)
Anion gap: 11 (ref 5–15)
BUN: 34 mg/dL — ABNORMAL HIGH (ref 8–23)
CO2: 25 mmol/L (ref 22–32)
Calcium: 9.2 mg/dL (ref 8.9–10.3)
Chloride: 103 mmol/L (ref 98–111)
Creatinine, Ser: 2.19 mg/dL — ABNORMAL HIGH (ref 0.61–1.24)
GFR calc Af Amer: 29 mL/min — ABNORMAL LOW (ref >60)
GFR calc non Af Amer: 25 mL/min — ABNORMAL LOW (ref >60)
Glucose, Bld: 101 mg/dL — ABNORMAL HIGH (ref 70–99)
Potassium: 3.8 mmol/L (ref 3.5–5.1)
Sodium: 139 mmol/L (ref 135–145)
Total Bilirubin: 0.6 mg/dL (ref 0.3–1.2)
Total Protein: 6.5 g/dL (ref 6.5–8.1)

## 2019-01-08 LAB — CBC WITH DIFFERENTIAL/PLATELET
Abs Immature Granulocytes: 0.02 10*3/uL (ref 0.00–0.07)
Basophils Absolute: 0 10*3/uL (ref 0.0–0.1)
Basophils Relative: 1 %
Eosinophils Absolute: 0.2 10*3/uL (ref 0.0–0.5)
Eosinophils Relative: 4 %
HCT: 43.2 % (ref 39.0–52.0)
Hemoglobin: 13.6 g/dL (ref 13.0–17.0)
Immature Granulocytes: 1 %
Lymphocytes Relative: 29 %
Lymphs Abs: 1.3 10*3/uL (ref 0.7–4.0)
MCH: 28.1 pg (ref 26.0–34.0)
MCHC: 31.5 g/dL (ref 30.0–36.0)
MCV: 89.3 fL (ref 80.0–100.0)
Monocytes Absolute: 0.4 10*3/uL (ref 0.1–1.0)
Monocytes Relative: 9 %
Neutro Abs: 2.6 10*3/uL (ref 1.7–7.7)
Neutrophils Relative %: 56 %
Platelets: 209 10*3/uL (ref 150–400)
RBC: 4.84 MIL/uL (ref 4.22–5.81)
RDW: 15.9 % — ABNORMAL HIGH (ref 11.5–15.5)
WBC: 4.4 10*3/uL (ref 4.0–10.5)
nRBC: 0 % (ref 0.0–0.2)

## 2019-01-08 MED ORDER — SODIUM CHLORIDE 0.9% FLUSH: 3.0000 mL | Freq: Once | INTRAVENOUS | Status: DC

## 2019-01-08 NOTE — Telephone Encounter (Signed)
Caregiver aware of recommendation- states patient already took medication today and also wants provider to be aware that patient also takes 81mg  Asprin daily. Please advise

## 2019-01-08 NOTE — Telephone Encounter (Signed)
Aware and verbalizes understanding.  

## 2019-01-08 NOTE — Telephone Encounter (Signed)
Description   Hold today and tomorrow take 5 mg(2 pills) on Fridays and 2.5 mg (1 pill) the rest of the week  INR today is 4.4  (goal is 2-3)  Recheck in 2 weeks     Caryl Pina, MD [place] 01/08/2019, 3:34 PM

## 2019-01-08 NOTE — Telephone Encounter (Signed)
Fax received mdINR PT/INR self testing service Test date/time 01/08/2019 7:31am INR 4.4  Route to pools.

## 2019-01-08 NOTE — Telephone Encounter (Signed)
Have them go ahead and discontinue the 81 mg aspirin and no longer take it at all in the future, he can hold tomorrow and the next days doses and then start from there.

## 2019-01-08 NOTE — ED Triage Notes (Signed)
Per nephew, also pt's caregiver. Pt was admitted and discharged from hospital within the past two weeks. At the time was having cellulitis to R lower leg and has a follow up appt 10/16 with vascular. Caregiver reports redness, edema, and pain have increased since discharged; pt has finished rounds of doxycycline and keflex without improvement. Pt is taking norco 5mg -325mg  at home to manage pain but has experienced constipation. Family has had difficulty with pain management and constipation at home.

## 2019-01-09 ENCOUNTER — Other Ambulatory Visit: Payer: Self-pay

## 2019-01-09 DIAGNOSIS — I739 Peripheral vascular disease, unspecified: Secondary | ICD-10-CM | POA: Diagnosis not present

## 2019-01-09 DIAGNOSIS — I2510 Atherosclerotic heart disease of native coronary artery without angina pectoris: Secondary | ICD-10-CM | POA: Diagnosis not present

## 2019-01-09 DIAGNOSIS — M109 Gout, unspecified: Secondary | ICD-10-CM | POA: Diagnosis not present

## 2019-01-09 DIAGNOSIS — L03115 Cellulitis of right lower limb: Secondary | ICD-10-CM | POA: Diagnosis not present

## 2019-01-09 DIAGNOSIS — I252 Old myocardial infarction: Secondary | ICD-10-CM | POA: Diagnosis not present

## 2019-01-09 DIAGNOSIS — M150 Primary generalized (osteo)arthritis: Secondary | ICD-10-CM | POA: Diagnosis not present

## 2019-01-09 NOTE — ED Notes (Signed)
Patients nephew states he works in emergency room and it should be something we can do for the pain and his uncle has a vascular leg problem. Asks to speak to triage RN. Informed Annamary Carolin, RN

## 2019-01-09 NOTE — ED Notes (Signed)
Patient nephew states he has to be at work at 61am and his cousin is coming to pick them up. States patient is uncomfortable sitting for this long and his leg is only getting worse. Encouraged to stay and assured his name would be one of the next called. Pt left without being seen.

## 2019-01-10 DIAGNOSIS — L03115 Cellulitis of right lower limb: Secondary | ICD-10-CM | POA: Diagnosis not present

## 2019-01-10 DIAGNOSIS — M150 Primary generalized (osteo)arthritis: Secondary | ICD-10-CM | POA: Diagnosis not present

## 2019-01-10 DIAGNOSIS — M109 Gout, unspecified: Secondary | ICD-10-CM | POA: Diagnosis not present

## 2019-01-10 DIAGNOSIS — I739 Peripheral vascular disease, unspecified: Secondary | ICD-10-CM | POA: Diagnosis not present

## 2019-01-10 DIAGNOSIS — I252 Old myocardial infarction: Secondary | ICD-10-CM | POA: Diagnosis not present

## 2019-01-10 DIAGNOSIS — I2510 Atherosclerotic heart disease of native coronary artery without angina pectoris: Secondary | ICD-10-CM | POA: Diagnosis not present

## 2019-01-11 DIAGNOSIS — I252 Old myocardial infarction: Secondary | ICD-10-CM | POA: Diagnosis not present

## 2019-01-11 DIAGNOSIS — I2510 Atherosclerotic heart disease of native coronary artery without angina pectoris: Secondary | ICD-10-CM | POA: Diagnosis not present

## 2019-01-11 DIAGNOSIS — M150 Primary generalized (osteo)arthritis: Secondary | ICD-10-CM | POA: Diagnosis not present

## 2019-01-11 DIAGNOSIS — M109 Gout, unspecified: Secondary | ICD-10-CM | POA: Diagnosis not present

## 2019-01-11 DIAGNOSIS — I739 Peripheral vascular disease, unspecified: Secondary | ICD-10-CM | POA: Diagnosis not present

## 2019-01-11 DIAGNOSIS — L03115 Cellulitis of right lower limb: Secondary | ICD-10-CM | POA: Diagnosis not present

## 2019-01-16 DIAGNOSIS — I739 Peripheral vascular disease, unspecified: Secondary | ICD-10-CM | POA: Diagnosis not present

## 2019-01-16 DIAGNOSIS — L03115 Cellulitis of right lower limb: Secondary | ICD-10-CM | POA: Diagnosis not present

## 2019-01-16 DIAGNOSIS — M150 Primary generalized (osteo)arthritis: Secondary | ICD-10-CM | POA: Diagnosis not present

## 2019-01-16 DIAGNOSIS — I2510 Atherosclerotic heart disease of native coronary artery without angina pectoris: Secondary | ICD-10-CM | POA: Diagnosis not present

## 2019-01-16 DIAGNOSIS — M109 Gout, unspecified: Secondary | ICD-10-CM | POA: Diagnosis not present

## 2019-01-16 DIAGNOSIS — I252 Old myocardial infarction: Secondary | ICD-10-CM | POA: Diagnosis not present

## 2019-01-18 DIAGNOSIS — I2510 Atherosclerotic heart disease of native coronary artery without angina pectoris: Secondary | ICD-10-CM | POA: Diagnosis not present

## 2019-01-18 DIAGNOSIS — M109 Gout, unspecified: Secondary | ICD-10-CM | POA: Diagnosis not present

## 2019-01-18 DIAGNOSIS — I739 Peripheral vascular disease, unspecified: Secondary | ICD-10-CM | POA: Diagnosis not present

## 2019-01-18 DIAGNOSIS — I252 Old myocardial infarction: Secondary | ICD-10-CM | POA: Diagnosis not present

## 2019-01-18 DIAGNOSIS — M150 Primary generalized (osteo)arthritis: Secondary | ICD-10-CM | POA: Diagnosis not present

## 2019-01-18 DIAGNOSIS — L03115 Cellulitis of right lower limb: Secondary | ICD-10-CM | POA: Diagnosis not present

## 2019-01-19 ENCOUNTER — Ambulatory Visit: Payer: BC Managed Care – PPO | Admitting: Urology

## 2019-01-19 ENCOUNTER — Telehealth: Payer: Self-pay | Admitting: *Deleted

## 2019-01-19 DIAGNOSIS — Z86711 Personal history of pulmonary embolism: Secondary | ICD-10-CM

## 2019-01-19 DIAGNOSIS — I4891 Unspecified atrial fibrillation: Secondary | ICD-10-CM

## 2019-01-19 NOTE — Telephone Encounter (Signed)
Patient and daughter notified and expressed understanding. MPulliam, CMA/RT(R)

## 2019-01-19 NOTE — Telephone Encounter (Signed)
Fax received mdINR PT/INR self testing service Test date/time 01/18/19 6:09 pm INR 1.8

## 2019-01-19 NOTE — Telephone Encounter (Signed)
Description   Take 3 pills (7.5 mg) today and then continue to take 5 mg(2 pills) on Fridays and 2.5 mg (1 pill) the rest of the week  INR today is 1.8  (goal is 2-3)  Recheck in 2 weeks      Caryl Pina, MD [place] 01/19/2019, 8:52 AM

## 2019-01-20 DIAGNOSIS — L03115 Cellulitis of right lower limb: Secondary | ICD-10-CM | POA: Diagnosis not present

## 2019-01-20 DIAGNOSIS — I2510 Atherosclerotic heart disease of native coronary artery without angina pectoris: Secondary | ICD-10-CM | POA: Diagnosis not present

## 2019-01-20 DIAGNOSIS — M109 Gout, unspecified: Secondary | ICD-10-CM | POA: Diagnosis not present

## 2019-01-20 DIAGNOSIS — I739 Peripheral vascular disease, unspecified: Secondary | ICD-10-CM | POA: Diagnosis not present

## 2019-01-20 DIAGNOSIS — M150 Primary generalized (osteo)arthritis: Secondary | ICD-10-CM | POA: Diagnosis not present

## 2019-01-20 DIAGNOSIS — I252 Old myocardial infarction: Secondary | ICD-10-CM | POA: Diagnosis not present

## 2019-01-22 ENCOUNTER — Telehealth: Payer: Self-pay | Admitting: *Deleted

## 2019-01-22 DIAGNOSIS — I2510 Atherosclerotic heart disease of native coronary artery without angina pectoris: Secondary | ICD-10-CM | POA: Diagnosis not present

## 2019-01-22 DIAGNOSIS — M109 Gout, unspecified: Secondary | ICD-10-CM | POA: Diagnosis not present

## 2019-01-22 DIAGNOSIS — I252 Old myocardial infarction: Secondary | ICD-10-CM | POA: Diagnosis not present

## 2019-01-22 DIAGNOSIS — Z86711 Personal history of pulmonary embolism: Secondary | ICD-10-CM

## 2019-01-22 DIAGNOSIS — I4891 Unspecified atrial fibrillation: Secondary | ICD-10-CM

## 2019-01-22 DIAGNOSIS — M150 Primary generalized (osteo)arthritis: Secondary | ICD-10-CM | POA: Diagnosis not present

## 2019-01-22 DIAGNOSIS — L03115 Cellulitis of right lower limb: Secondary | ICD-10-CM | POA: Diagnosis not present

## 2019-01-22 DIAGNOSIS — I739 Peripheral vascular disease, unspecified: Secondary | ICD-10-CM | POA: Diagnosis not present

## 2019-01-22 NOTE — Telephone Encounter (Signed)
VM from [place] w/ [facility] PT / INR drawn today PT 20.7   INR  2.3  Call Otila Kluver w/ [facility] [phone] with any changes or additional orders

## 2019-01-22 NOTE — Telephone Encounter (Signed)
Description   continue to take 5 mg(2 pills) on Fridays and 2.5 mg (1 pill) the rest of the week  INR today is 2.3  (goal is 2-3)  Recheck in 2 weeks      Caryl Pina, MD [place] 01/22/2019, 5:48 PM

## 2019-01-23 DIAGNOSIS — I739 Peripheral vascular disease, unspecified: Secondary | ICD-10-CM | POA: Diagnosis not present

## 2019-01-23 DIAGNOSIS — L03115 Cellulitis of right lower limb: Secondary | ICD-10-CM | POA: Diagnosis not present

## 2019-01-23 DIAGNOSIS — M109 Gout, unspecified: Secondary | ICD-10-CM | POA: Diagnosis not present

## 2019-01-23 DIAGNOSIS — M150 Primary generalized (osteo)arthritis: Secondary | ICD-10-CM | POA: Diagnosis not present

## 2019-01-23 DIAGNOSIS — I2510 Atherosclerotic heart disease of native coronary artery without angina pectoris: Secondary | ICD-10-CM | POA: Diagnosis not present

## 2019-01-23 DIAGNOSIS — I252 Old myocardial infarction: Secondary | ICD-10-CM | POA: Diagnosis not present

## 2019-01-23 NOTE — Telephone Encounter (Signed)
Notified Tina with [facility]. She verbalized understanding

## 2019-01-24 ENCOUNTER — Other Ambulatory Visit: Payer: Self-pay

## 2019-01-24 ENCOUNTER — Other Ambulatory Visit: Payer: Self-pay | Admitting: *Deleted

## 2019-01-24 DIAGNOSIS — M79604 Pain in right leg: Secondary | ICD-10-CM

## 2019-01-24 MED ORDER — CARVEDILOL 6.25 MG PO TABS: 6.2500 mg | ORAL_TABLET | Freq: Two times a day (BID) | ORAL | 1 refills | Status: DC

## 2019-01-25 ENCOUNTER — Other Ambulatory Visit: Payer: Self-pay | Admitting: *Deleted

## 2019-01-25 ENCOUNTER — Telehealth (HOSPITAL_COMMUNITY): Payer: Self-pay

## 2019-01-25 MED ORDER — ISOSORB DINITRATE-HYDRALAZINE 20-37.5 MG PO TABS: 1.0000 | ORAL_TABLET | Freq: Three times a day (TID) | ORAL | 1 refills | Status: DC

## 2019-01-25 NOTE — Telephone Encounter (Signed)

## 2019-01-26 ENCOUNTER — Encounter: Payer: Self-pay | Admitting: Vascular Surgery

## 2019-01-26 ENCOUNTER — Ambulatory Visit (HOSPITAL_COMMUNITY)
Admission: RE | Admit: 2019-01-26 | Discharge: 2019-01-26 | Disposition: A | Discharge status: Home / Self Care | Payer: Medicare Other | Source: Ambulatory Visit | Attending: Vascular Surgery | Admitting: Vascular Surgery

## 2019-01-26 ENCOUNTER — Other Ambulatory Visit: Payer: Self-pay

## 2019-01-26 ENCOUNTER — Ambulatory Visit (INDEPENDENT_AMBULATORY_CARE_PROVIDER_SITE_OTHER): Payer: Medicare Other | Admitting: Vascular Surgery

## 2019-01-26 VITALS — BP 113/63 | HR 75 | Temp 97.5°F | Resp 18 | Ht 66.0 in | Wt 190.0 lb

## 2019-01-26 DIAGNOSIS — L03115 Cellulitis of right lower limb: Secondary | ICD-10-CM | POA: Diagnosis not present

## 2019-01-26 DIAGNOSIS — I130 Hypertensive heart and chronic kidney disease with heart failure and stage 1 through stage 4 chronic kidney disease, or unspecified chronic kidney disease: Secondary | ICD-10-CM | POA: Diagnosis not present

## 2019-01-26 DIAGNOSIS — M150 Primary generalized (osteo)arthritis: Secondary | ICD-10-CM | POA: Diagnosis not present

## 2019-01-26 DIAGNOSIS — J449 Chronic obstructive pulmonary disease, unspecified: Secondary | ICD-10-CM | POA: Diagnosis not present

## 2019-01-26 DIAGNOSIS — F0280 Dementia in other diseases classified elsewhere without behavioral disturbance: Secondary | ICD-10-CM | POA: Diagnosis not present

## 2019-01-26 DIAGNOSIS — M50323 Other cervical disc degeneration at C6-C7 level: Secondary | ICD-10-CM | POA: Diagnosis not present

## 2019-01-26 DIAGNOSIS — I252 Old myocardial infarction: Secondary | ICD-10-CM | POA: Diagnosis not present

## 2019-01-26 DIAGNOSIS — N183 Chronic kidney disease, stage 3 unspecified: Secondary | ICD-10-CM | POA: Diagnosis not present

## 2019-01-26 DIAGNOSIS — E785 Hyperlipidemia, unspecified: Secondary | ICD-10-CM | POA: Diagnosis not present

## 2019-01-26 DIAGNOSIS — K219 Gastro-esophageal reflux disease without esophagitis: Secondary | ICD-10-CM | POA: Diagnosis not present

## 2019-01-26 DIAGNOSIS — I428 Other cardiomyopathies: Secondary | ICD-10-CM | POA: Diagnosis not present

## 2019-01-26 DIAGNOSIS — I2510 Atherosclerotic heart disease of native coronary artery without angina pectoris: Secondary | ICD-10-CM | POA: Diagnosis not present

## 2019-01-26 DIAGNOSIS — M109 Gout, unspecified: Secondary | ICD-10-CM | POA: Diagnosis not present

## 2019-01-26 DIAGNOSIS — F329 Major depressive disorder, single episode, unspecified: Secondary | ICD-10-CM | POA: Diagnosis not present

## 2019-01-26 DIAGNOSIS — I739 Peripheral vascular disease, unspecified: Secondary | ICD-10-CM

## 2019-01-26 DIAGNOSIS — H5462 Unqualified visual loss, left eye, normal vision right eye: Secondary | ICD-10-CM | POA: Diagnosis not present

## 2019-01-26 DIAGNOSIS — G309 Alzheimer's disease, unspecified: Secondary | ICD-10-CM | POA: Diagnosis not present

## 2019-01-26 DIAGNOSIS — I5042 Chronic combined systolic (congestive) and diastolic (congestive) heart failure: Secondary | ICD-10-CM | POA: Diagnosis not present

## 2019-01-26 DIAGNOSIS — M898X8 Other specified disorders of bone, other site: Secondary | ICD-10-CM | POA: Diagnosis not present

## 2019-01-26 DIAGNOSIS — M79604 Pain in right leg: Secondary | ICD-10-CM | POA: Diagnosis not present

## 2019-01-26 DIAGNOSIS — I447 Left bundle-branch block, unspecified: Secondary | ICD-10-CM | POA: Diagnosis not present

## 2019-01-26 DIAGNOSIS — N400 Enlarged prostate without lower urinary tract symptoms: Secondary | ICD-10-CM | POA: Diagnosis not present

## 2019-01-26 DIAGNOSIS — I480 Paroxysmal atrial fibrillation: Secondary | ICD-10-CM | POA: Diagnosis not present

## 2019-01-26 DIAGNOSIS — Z9181 History of falling: Secondary | ICD-10-CM | POA: Diagnosis not present

## 2019-01-26 DIAGNOSIS — J849 Interstitial pulmonary disease, unspecified: Secondary | ICD-10-CM | POA: Diagnosis not present

## 2019-01-26 DIAGNOSIS — H409 Unspecified glaucoma: Secondary | ICD-10-CM | POA: Diagnosis not present

## 2019-01-26 NOTE — Progress Notes (Signed)
[employer]

## 2019-01-26 NOTE — Progress Notes (Signed)
Patient ID: Scott [name], male   DOB: 01/16/1922, 85 y.o.   MRN: [id number]  Reason for Consult: Follow-up   Referred by Dettinger, Fransisca Kaufmann, MD  Subjective:     HPI:  Scott [name] is a 85 y.o. male recently admitted to the hospital with a fall was found to have elevated troponins at that time.  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  Does have a left AKA performed multiple years ago secondary to gangrenous left foot.  According to his son he is having some foot pain does have swelling as well and skin discoloration that has been darker.  He does not have any further tissue loss or ulceration in the shin ulcer did heal since his hospitalization.  Patient does use the lower extremity for transferring.  Past Medical History:  Diagnosis Date  . Arthritis    "all over"  . Atrial fibrillation ([place])   . Benign localized prostatic hyperplasia with lower urinary tract symptoms (LUTS)   . Bradycardia 11/28/2015   Severe-HR in the 20s to 30s following intubation for urological procedure.  Marland Kitchen CAD- non obstructive disease by cath 3/14 cardiologist-  dr hochrein  . Chronic lower back pain   . COPD (chronic obstructive pulmonary disease) ([place])   . DDD (degenerative disc disease)   . Depression   . Dyspnea    w/ exertion and lying down (raise head)  . ETOH abuse   . GERD (gastroesophageal reflux disease)   . Glaucoma   . Glaucoma, both eyes   . History of bladder cancer urologist-- dr Jeffie Pollock   first dx 2012--  s/p TURBT's  . History of DVT of lower extremity 03/2013   bilateral   . History of pulmonary embolus (PE) 04/2013  . HTN (hypertension)   . Hyperlipidemia   . Hypertension   . LBBB (left bundle branch block)    chronic  . Non-ischemic cardiomyopathy ([place])    last echo 01-18-2017, ef 35-40%  . NSVT (nonsustained ventricular tachycardia) ([place])    a. NSVT 06/2012; NSVT also seen during 03/2013 adm. b. Med rx. Not candidate for ICD given adv age.  Marland Kitchen PAD (peripheral  artery disease) ([place])   . 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 PVCs (premature ventricular contractions)   . PVD (peripheral vascular disease) (H[facility])    Right ABI .75, Left .78 (2006)  . Syncope 06/2012   a. Felt to be postural syncope related to diuretics 06/2012.  Marland Kitchen Systolic and diastolic CHF, chronic (HCC) cardiologsit-  dr hochrein   a. NICM - patent cors 06/2012, EF 40% at that time. b. 03/2013 eval: EF 20-25%.  . Vision loss, left eye    due to glaucoma  . Wears glasses    Family History  Problem Relation Age of Onset  . Hypertension Mother   . Arthritis Mother   . Lung cancer Sister   . Deep vein thrombosis Father   . Early death Brother    Past Surgical History:  Procedure Laterality Date  . AMPUTATION Left 10/16/2014   Procedure: AMPUTATION ABOVE KNEE- LEFT;  Surgeon: Mal Misty, MD;  Location: [place];  Service: Vascular;  Laterality: Left;  . CARDIAC CATHETERIZATION  05-11-2004  DR [facility]   MINIMAL CORANARY PLAQUE/ NORMAL LVF/ EF 55%/  NON-OBSTRUCTIVE LAD 25%  . CARDIAC CATHETERIZATION  06/18/2012   dr hochrein   mild  luminal irregularities of coronaries, ef 45-50%  . CARDIOVASCULAR STRESS TEST  10-15-2010   LOW RISK NUCLEAR STUDY/ NO EVIDENCE OF ISCHEMIA/ NORMAL EF  . CATARACT EXTRACTION W/ INTRAOCULAR LENS  IMPLANT, BILATERAL Bilateral   . CYSTOSCOPY  10/07/2011   Procedure: CYSTOSCOPY;  Surgeon: Malka So, MD;  Location: [facility];  Service: Urology;  Laterality: N/A;  . CYSTOSCOPY N/A 10/20/2018   Procedure: CYSTOSCOPY WITH FULGURATION OF PROSTATIC URETHRAL TUMOR;  Surgeon: [name], MD;  Location[facility];  Service: Urology;  Laterality: N/A;  . CYSTOSCOPY WITH BIOPSY  03/14/2012   Procedure: CYSTOSCOPY WITH BIOPSY;  Surgeon: Malka So, MD;  Location: [facility];  Service: Urology;  Laterality: N/A;  WITH FULGURATION  . CYSTOSCOPY WITH BIOPSY N/A  12/09/2016   Procedure: CYSTOSCOPY WITH BIOPSY AND FULGURATION;  Surgeon: [name], MD;  Location: [facility];  Service: Urology;  Laterality: N/A;  . CYSTOSCOPY WITH BIOPSY N/A 12/20/2017   Procedure: CYSTOSCOPY WITH BIOPSY WITH FULGURATION;  Surgeon: [name], MD;  Location: [facility];  Service: Urology;  Laterality: N/A;  . CYSTOSCOPY WITH BIOPSY N/A 06/09/2018   Procedure: CYSTOSCOPY WITH BIOPSY AND FULGURATION;  Surgeon: [name], MD;  Location: [facility];  Service: Urology;  Laterality: N/A;  . CYSTOSCOPY WITH FULGERATION N/A 01/20/2016   Procedure: CYSTOSCOPY, BIOPSY WITH FULGERATION OF URTHRAL TUMOR;  Surgeon: [name], MD;  Location[facility];  Service: Urology;  Laterality: N/A;  . CYSTOSCOPY WITH FULGERATION N/A 06/01/2016   Procedure: CYSTOSCOPY WITH FULGERATION urethral tumors and bladder neck;  Surgeon: [name], MD;  Location: [facility];  Service: Urology;  Laterality: N/A;  . SHOULDER SURGERY Left 1970's  . TONSILLECTOMY    . TRANSTHORACIC ECHOCARDIOGRAM  01-18-2017   dr hochrein   moderate LVH, ef 35-40%, diffuse hypokinesis/  trivial AR and MR/  mild TR  . TRANSURETHRAL RESECTION OF BLADDER TUMOR  10/07/2011   Procedure: TRANSURETHRAL RESECTION OF BLADDER TUMOR (TURBT);  Surgeon: Malka So, MD;  Location: [facility];  Service: Urology;  Laterality: N/A;  . TRANSURETHRAL RESECTION OF BLADDER TUMOR N/A 07/10/2015   Procedure: TRANSURETHRAL RESECTION OF BLADDER TUMOR (TURBT), CYSTOSCOPY ;  Surgeon: [name], MD;  [facility];  Service: Urology;  Laterality: N/A;  . TRANSURETHRAL RESECTION OF PROSTATE  03/14/2012   Procedure: TRANSURETHRAL RESECTION OF THE PROSTATE WITH GYRUS INSTRUMENTS;  Surgeon: Malka So, MD;  Location[facility];  Service: Urology;  Laterality: N/A;    Short Social History:  Social History   Tobacco Use  . Smoking status: Former Smoker    Packs/day: 1.00    Years: 35.00    Pack years: 35.00    Types: Cigarettes    Start date:  04/12/1942    Quit date: 04/13/1975    Years since quitting: 43.8  . Smokeless tobacco: Never Used  Substance Use Topics  . Alcohol use: Yes    Alcohol/week: 7.0 standard drinks    Types: 7 Cans of beer per week    Allergies  Allergen Reactions  . Lipitor [Atorvastatin] Other (See Comments)    Unknown rxn per pt    Current Outpatient Medications  Medication Sig Dispense Refill  . acetaminophen (TYLENOL) 325 MG tablet Take 2 tablets (650 mg total) by mouth every 6 (six) hours as needed for mild pain (or Fever >/= 101).    Marland Kitchen albuterol (PROVENTIL) (2.5 MG/3ML) 0.083% nebulizer solution NEBULIZE 1 VIAL EVERY 6 HOURS AS NEEDED FOR WHEEZING OR SHORTNESS  OF BREATH (Patient taking differently: Take 2.5 mg by nebulization every 6 (six) hours as needed for wheezing or shortness of breath. ) 180 mL 3  . carvedilol (COREG) 6.25 MG tablet Take 1 tablet (6.25 mg total) by mouth 2 (two) times daily. 60 tablet 1  . colchicine 0.6 MG tablet Take 1 tablet (0.6 mg total) by mouth daily. 1 tablet oral daily for 10 days, then stop 10 tablet 0  . donepezil (ARICEPT) 10 MG tablet TAKE ONE TABLET AT BEDTIME (Patient taking differently: Take 10 mg by mouth at bedtime. ) 30 tablet 2  . doxycycline (VIBRA-TABS) 100 MG tablet Take 1 tablet (100 mg total) by mouth 2 (two) times daily. 6 tablet 0  . febuxostat (ULORIC) 40 MG tablet TAKE 1 TABLET DAILY 30 tablet 0  . fluticasone (FLONASE) 50 MCG/ACT nasal spray Place 1 spray into both nostrils daily as needed for allergies or rhinitis. (Patient taking differently: Place 1 spray into both nostrils at bedtime. ) 16 g 2  . folic acid (FOLVITE) 1 MG tablet TAKE 1 TABLET DAILY 30 tablet 2  . furosemide (LASIX) 40 MG tablet Take 1 tablet (40 mg total) by mouth 2 (two) times daily. 60 tablet 0  . gabapentin (NEURONTIN) 100 MG capsule TAKE 2 CAPSULES TWICE A DAY (Patient taking differently: Take 200 mg by mouth 2 (two) times daily. ) 120 capsule 2  . HYDROcodone-acetaminophen  (NORCO/VICODIN) 5-325 MG tablet Take 1 tablet by mouth 2 (two) times daily as needed for moderate pain. 60 tablet 0  . ipratropium (ATROVENT) 0.02 % nebulizer solution Take 0.5 mg by nebulization every 6 (six) hours as needed for wheezing or shortness of breath.    . isosorbide-hydrALAZINE (BIDIL) 20-37.5 MG tablet Take 1 tablet by mouth 3 (three) times daily. 90 tablet 1  . lactobacillus acidophilus & bulgar (LACTINEX) chewable tablet Chew 1 tablet by mouth 3 (three) times daily with meals. 45 tablet 0  . MYRBETRIQ 25 MG TB24 tablet Take 25 mg by mouth every morning.     . pantoprazole (PROTONIX) 40 MG tablet TAKE 1 TABLET DAILY (Patient taking differently: Take 40 mg by mouth daily. ) 30 tablet 0  . PROAIR HFA 108 (90 Base) MCG/ACT inhaler 2 PUFFS EVERY 6 HOURS AS NEEDED FOR WHEEZING OR SHORTNESS OF BREATH (Patient taking differently: Inhale 2 puffs into the lungs every 6 (six) hours as needed for wheezing or shortness of breath. ) 8.5 g 4  . sertraline (ZOLOFT) 50 MG tablet TAKE 1 TABLET IN THE MORNING 30 tablet 0  . simvastatin (ZOCOR) 10 MG tablet Take 1 tablet (10 mg total) by mouth at bedtime. 90 tablet 1  . traMADol (ULTRAM) 50 MG tablet TK 1 T PO Q 8 H PRF PAIN    . warfarin (COUMADIN) 2.5 MG tablet Take 1-2 tablets (2.5-5 mg total) by mouth See admin instructions. Take 2.5 mg by mouth daily except Mondays and Fridays take 5 mg 40 tablet 3  . aspirin EC 81 MG EC tablet Take 1 tablet (81 mg total) by mouth daily. (Patient not taking: Reported on 01/26/2019)     No current facility-administered medications for this visit.     Review of Systems  Constitutional:  Constitutional negative. HENT: HENT negative.  Eyes: Eyes negative.  Respiratory: Respiratory negative.  Cardiovascular: Cardiovascular negative.  GI: Gastrointestinal negative.  Skin: Positive for wound.  Neurological: Neurological negative. Hematologic: Hematologic/lymphatic negative.  Psychiatric: Psychiatric negative.         Objective:  Objective   There were no vitals filed for this visit. There is no height or weight on file to calculate BMI.  Physical Exam HENT:     Head: Normocephalic.     Nose: Nose normal.  Eyes:     Pupils: Pupils are equal, round, and reactive to light.  Neck:     Musculoskeletal: Normal range of motion and neck supple.  Cardiovascular:     Rate and Rhythm: Normal rate and regular rhythm.     Pulses:          Femoral pulses are 2+ on the right side and 2+ on the left side.      Popliteal pulses are 0 on the right side.  Abdominal:     General: Abdomen is flat.     Palpations: Abdomen is soft.  Musculoskeletal: Normal range of motion.  Skin:    General: Skin is warm and dry.  Neurological:     General: No focal deficit present.     Mental Status: He is alert.  Psychiatric:        Mood and Affect: Mood normal.        Thought Content: Thought content normal.        Judgment: Judgment normal.     Data: I have independently interpreted his right lower extremity arterial duplex.  He has an occluded popliteal artery monophasic flow in the SFA monophasic flow distal to that.     Assessment/Plan:     85 year old male with previous left above-knee amputation for gangrenous changes left foot.  Right foot does have pain which is likely multifactorial.  He does have an occluded popliteal artery with monophasic flow in the SFA.  I have offered angiography with the caveat the patient does have chronic kidney disease and would be at some risk of progressing to end-stage renal disease with contrast.  Patient at this time states that he does not have much pain and given that he does not have any ulceration would not like to proceed with procedure.  We have discussed really protecting his right foot from his wheelchair to minimize any small trauma that could lead to bigger issues.  Patient will discuss with his son and daughter-in-law and if he does want angiography we can schedule  with [place] without having the patient back in the office.  Otherwise he will follow-up in 3 months with repeat ABIs.     Waynetta Sandy MD [facility]

## 2019-01-29 DIAGNOSIS — I2510 Atherosclerotic heart disease of native coronary artery without angina pectoris: Secondary | ICD-10-CM | POA: Diagnosis not present

## 2019-01-29 DIAGNOSIS — M150 Primary generalized (osteo)arthritis: Secondary | ICD-10-CM | POA: Diagnosis not present

## 2019-01-29 DIAGNOSIS — I739 Peripheral vascular disease, unspecified: Secondary | ICD-10-CM | POA: Diagnosis not present

## 2019-01-29 DIAGNOSIS — I252 Old myocardial infarction: Secondary | ICD-10-CM | POA: Diagnosis not present

## 2019-01-29 DIAGNOSIS — M109 Gout, unspecified: Secondary | ICD-10-CM | POA: Diagnosis not present

## 2019-01-29 DIAGNOSIS — L03115 Cellulitis of right lower limb: Secondary | ICD-10-CM | POA: Diagnosis not present

## 2019-01-30 ENCOUNTER — Other Ambulatory Visit: Payer: Self-pay | Admitting: Family Medicine

## 2019-01-30 ENCOUNTER — Other Ambulatory Visit: Payer: Self-pay

## 2019-01-30 ENCOUNTER — Telehealth: Payer: Self-pay | Admitting: Family Medicine

## 2019-01-30 ENCOUNTER — Other Ambulatory Visit: Payer: Medicare Other

## 2019-01-30 DIAGNOSIS — M79671 Pain in right foot: Secondary | ICD-10-CM

## 2019-01-30 DIAGNOSIS — R410 Disorientation, unspecified: Secondary | ICD-10-CM | POA: Diagnosis not present

## 2019-01-30 DIAGNOSIS — I739 Peripheral vascular disease, unspecified: Secondary | ICD-10-CM

## 2019-01-30 DIAGNOSIS — I5042 Chronic combined systolic (congestive) and diastolic (congestive) heart failure: Secondary | ICD-10-CM

## 2019-01-30 DIAGNOSIS — M109 Gout, unspecified: Secondary | ICD-10-CM | POA: Diagnosis not present

## 2019-01-30 DIAGNOSIS — I2510 Atherosclerotic heart disease of native coronary artery without angina pectoris: Secondary | ICD-10-CM | POA: Diagnosis not present

## 2019-01-30 DIAGNOSIS — M150 Primary generalized (osteo)arthritis: Secondary | ICD-10-CM | POA: Diagnosis not present

## 2019-01-30 DIAGNOSIS — I252 Old myocardial infarction: Secondary | ICD-10-CM | POA: Diagnosis not present

## 2019-01-30 DIAGNOSIS — L03115 Cellulitis of right lower limb: Secondary | ICD-10-CM | POA: Diagnosis not present

## 2019-01-30 MED ORDER — HYDROCODONE-ACETAMINOPHEN 5-325 MG PO TABS: 1.0000 | ORAL_TABLET | Freq: Two times a day (BID) | ORAL | 0 refills | Status: DC | PRN

## 2019-01-30 NOTE — Telephone Encounter (Signed)
Patient aware and verbalized understanding. °

## 2019-01-30 NOTE — Telephone Encounter (Signed)
Talked w/ Otila Kluver [facility] nurse Ordered UA/CS, CBC & CMP per Evelina Dun, NP d/t his recent confusion, he knew who the current president is and who is running in the current election but said his wife was down at the funeral parlor but did not know why and when told she had passed away, was adamant that he had talked with her that morning. Caregiver also aware  Caregiver says pt say vascular doctor last week, wanting to get an angiogram done on the pain in the right foot, but family is consulting with Dr. Jeffie Pollock on 10/13 d/t his kidney functions.  Pt is taking hydrocodone-acetaminophen 5/325 mg for this pain, has 2-3 pills left, needs refill sent to [facility]

## 2019-01-30 NOTE — Telephone Encounter (Signed)
Calling to check on home health orders for a urine and labs. Please advise.

## 2019-01-30 NOTE — Telephone Encounter (Signed)
Yes I agree with ordering the urinalysis and culture and a CBC and CMP as [name] indicated, we await those results to see if there is any kind of possible infection that could be causing the confusion besides relating to memory and Alzheimer's.  I agree to discuss with Dr. Jeffie Pollock about the angiography and possibilities for that.  We will send refill for hydrocodone for him

## 2019-01-30 NOTE — Progress Notes (Signed)
Sent refill for hydrocodone for patient

## 2019-01-30 NOTE — Addendum Note (Signed)
Addended by: Earlene Plater on: 01/30/2019 02:33 PM   Modules accepted: Orders

## 2019-01-31 ENCOUNTER — Other Ambulatory Visit: Payer: Self-pay | Admitting: Family Medicine

## 2019-01-31 LAB — CMP14+EGFR
ALT: 14 IU/L (ref 0–44)
AST: 23 IU/L (ref 0–40)
Albumin/Globulin Ratio: 1.6 (ref 1.2–2.2)
Albumin: 4.2 g/dL (ref 3.5–4.6)
Alkaline Phosphatase: 199 IU/L — ABNORMAL HIGH (ref 39–117)
BUN/Creatinine Ratio: 18 (ref 10–24)
BUN: 29 mg/dL (ref 10–36)
Bilirubin Total: 0.4 mg/dL (ref 0.0–1.2)
CO2: 25 mmol/L (ref 20–29)
Calcium: 10 mg/dL (ref 8.6–10.2)
Chloride: 106 mmol/L (ref 96–106)
Creatinine, Ser: 1.64 mg/dL — ABNORMAL HIGH (ref 0.76–1.27)
GFR calc Af Amer: 40 mL/min/{1.73_m2} — ABNORMAL LOW (ref >59)
GFR calc non Af Amer: 35 mL/min/{1.73_m2} — ABNORMAL LOW (ref >59)
Globulin, Total: 2.6 g/dL (ref 1.5–4.5)
Glucose: 85 mg/dL (ref 65–99)
Potassium: 3.9 mmol/L (ref 3.5–5.2)
Sodium: 147 mmol/L — ABNORMAL HIGH (ref 134–144)
Total Protein: 6.8 g/dL (ref 6.0–8.5)

## 2019-01-31 LAB — CBC WITH DIFFERENTIAL/PLATELET
Basophils Absolute: 0 10*3/uL (ref 0.0–0.2)
Basos: 0 %
EOS (ABSOLUTE): 0.1 10*3/uL (ref 0.0–0.4)
Eos: 2 %
Hematocrit: 41.1 % (ref 37.5–51.0)
Hemoglobin: 13.4 g/dL (ref 13.0–17.7)
Immature Grans (Abs): 0 10*3/uL (ref 0.0–0.1)
Immature Granulocytes: 0 %
Lymphocytes Absolute: 1.3 10*3/uL (ref 0.7–3.1)
Lymphs: 28 %
MCH: 28.2 pg (ref 26.6–33.0)
MCHC: 32.6 g/dL (ref 31.5–35.7)
MCV: 87 fL (ref 79–97)
Monocytes Absolute: 0.5 10*3/uL (ref 0.1–0.9)
Monocytes: 10 %
Neutrophils Absolute: 2.7 10*3/uL (ref 1.4–7.0)
Neutrophils: 60 %
Platelets: 180 10*3/uL (ref 150–450)
RBC: 4.75 x10E6/uL (ref 4.14–5.80)
RDW: 15.5 % — ABNORMAL HIGH (ref 11.6–15.4)
WBC: 4.6 10*3/uL (ref 3.4–10.8)

## 2019-02-01 DIAGNOSIS — I4821 Permanent atrial fibrillation: Secondary | ICD-10-CM | POA: Diagnosis not present

## 2019-02-02 ENCOUNTER — Other Ambulatory Visit: Payer: Medicare Other

## 2019-02-02 ENCOUNTER — Other Ambulatory Visit: Payer: Self-pay

## 2019-02-02 ENCOUNTER — Other Ambulatory Visit: Payer: Self-pay | Admitting: Family

## 2019-02-02 ENCOUNTER — Telehealth: Payer: Self-pay | Admitting: Family Medicine

## 2019-02-02 DIAGNOSIS — R410 Disorientation, unspecified: Secondary | ICD-10-CM

## 2019-02-02 DIAGNOSIS — I4891 Unspecified atrial fibrillation: Secondary | ICD-10-CM

## 2019-02-02 DIAGNOSIS — M79671 Pain in right foot: Secondary | ICD-10-CM | POA: Diagnosis not present

## 2019-02-02 DIAGNOSIS — Z86711 Personal history of pulmonary embolism: Secondary | ICD-10-CM

## 2019-02-02 LAB — MICROSCOPIC EXAMINATION
Epithelial Cells (non renal): >10 /hpf — AB (ref 0–10)
RBC, Urine: NONE SEEN /hpf (ref 0–2)
Renal Epithel, UA: >10 /hpf — AB

## 2019-02-02 LAB — URINALYSIS, COMPLETE
Bilirubin, UA: NEGATIVE
Glucose, UA: NEGATIVE
Ketones, UA: NEGATIVE
Leukocytes,UA: NEGATIVE
Nitrite, UA: POSITIVE — AB
Protein,UA: NEGATIVE
RBC, UA: NEGATIVE
Specific Gravity, UA: 1.015 (ref 1.005–1.030)
Urobilinogen, Ur: 0.2 mg/dL (ref 0.2–1.0)
pH, UA: 5 (ref 5.0–7.5)

## 2019-02-02 MED ORDER — CEPHALEXIN 500 MG PO CAPS: 500.0000 mg | ORAL_CAPSULE | Freq: Two times a day (BID) | ORAL | 0 refills | Status: DC

## 2019-02-02 NOTE — Telephone Encounter (Signed)
Description   continue to take 5 mg(2 pills) on Fridays and 2.5 mg (1 pill) the rest of the week  INR today is 2.0  (goal is 2-3)  Recheck in 2 weeks     Caryl Pina, MD [place] 02/02/2019, 1:02 PM

## 2019-02-02 NOTE — Telephone Encounter (Signed)
Aware of protime

## 2019-02-02 NOTE — Telephone Encounter (Signed)
Fax received mdINR PT/INR self testing service Test date/time 02/01/19 10:25pm INR 2.0

## 2019-02-04 LAB — URINE CULTURE

## 2019-02-06 DIAGNOSIS — M109 Gout, unspecified: Secondary | ICD-10-CM | POA: Diagnosis not present

## 2019-02-06 DIAGNOSIS — L03115 Cellulitis of right lower limb: Secondary | ICD-10-CM | POA: Diagnosis not present

## 2019-02-06 DIAGNOSIS — I739 Peripheral vascular disease, unspecified: Secondary | ICD-10-CM | POA: Diagnosis not present

## 2019-02-06 DIAGNOSIS — I252 Old myocardial infarction: Secondary | ICD-10-CM | POA: Diagnosis not present

## 2019-02-06 DIAGNOSIS — I2510 Atherosclerotic heart disease of native coronary artery without angina pectoris: Secondary | ICD-10-CM | POA: Diagnosis not present

## 2019-02-06 DIAGNOSIS — M150 Primary generalized (osteo)arthritis: Secondary | ICD-10-CM | POA: Diagnosis not present

## 2019-02-07 ENCOUNTER — Telehealth: Payer: Self-pay | Admitting: Family Medicine

## 2019-02-07 ENCOUNTER — Ambulatory Visit: Payer: Medicare Other | Admitting: Family Medicine

## 2019-02-07 NOTE — Telephone Encounter (Signed)
Appointment scheduled on 02/08/2019 at 10:30 am with Chevis Pretty.

## 2019-02-08 ENCOUNTER — Encounter: Payer: Self-pay | Admitting: Nurse Practitioner

## 2019-02-08 ENCOUNTER — Telehealth: Payer: Self-pay | Admitting: *Deleted

## 2019-02-08 ENCOUNTER — Ambulatory Visit (INDEPENDENT_AMBULATORY_CARE_PROVIDER_SITE_OTHER): Payer: Medicare Other | Admitting: Nurse Practitioner

## 2019-02-08 ENCOUNTER — Other Ambulatory Visit: Payer: Self-pay

## 2019-02-08 VITALS — BP 170/83 | HR 75 | Temp 96.4°F | Resp 20

## 2019-02-08 DIAGNOSIS — I4891 Unspecified atrial fibrillation: Secondary | ICD-10-CM

## 2019-02-08 DIAGNOSIS — Z86711 Personal history of pulmonary embolism: Secondary | ICD-10-CM

## 2019-02-08 DIAGNOSIS — M150 Primary generalized (osteo)arthritis: Secondary | ICD-10-CM | POA: Diagnosis not present

## 2019-02-08 DIAGNOSIS — L03115 Cellulitis of right lower limb: Secondary | ICD-10-CM | POA: Diagnosis not present

## 2019-02-08 DIAGNOSIS — I739 Peripheral vascular disease, unspecified: Secondary | ICD-10-CM | POA: Diagnosis not present

## 2019-02-08 DIAGNOSIS — I252 Old myocardial infarction: Secondary | ICD-10-CM | POA: Diagnosis not present

## 2019-02-08 DIAGNOSIS — M109 Gout, unspecified: Secondary | ICD-10-CM | POA: Diagnosis not present

## 2019-02-08 DIAGNOSIS — H6123 Impacted cerumen, bilateral: Secondary | ICD-10-CM

## 2019-02-08 DIAGNOSIS — I2510 Atherosclerotic heart disease of native coronary artery without angina pectoris: Secondary | ICD-10-CM | POA: Diagnosis not present

## 2019-02-08 NOTE — Patient Instructions (Signed)
Earwax Buildup, Adult The ears produce a substance called earwax that helps keep bacteria out of the ear and protects the skin in the ear canal. Occasionally, earwax can build up in the ear and cause discomfort or hearing loss. What increases the risk? This condition is more likely to develop in people who:  Are male.  Are elderly.  Naturally produce more earwax.  Clean their ears often with cotton swabs.  Use earplugs often.  Use in-ear headphones often.  Wear hearing aids.  Have narrow ear canals.  Have earwax that is overly thick or sticky.  Have eczema.  Are dehydrated.  Have excess hair in the ear canal. What are the signs or symptoms? Symptoms of this condition include:  Reduced or muffled hearing.  A feeling of fullness in the ear or feeling that the ear is plugged.  Fluid coming from the ear.  Ear pain.  Ear itch.  Ringing in the ear.  Coughing.  An obvious piece of earwax that can be seen inside the ear canal. How is this diagnosed? This condition may be diagnosed based on:  Your symptoms.  Your medical history.  An ear exam. During the exam, your health care provider will look into your ear with an instrument called an otoscope. You may have tests, including a hearing test. How is this treated? This condition may be treated by:  Using ear drops to soften the earwax.  Having the earwax removed by a health care provider. The health care provider may: ? Flush the ear with water. ? Use an instrument that has a loop on the end (curette). ? Use a suction device.  Surgery to remove the wax buildup. This may be done in severe cases. Follow these instructions at home:   Take over-the-counter and prescription medicines only as told by your health care provider.  Do not put any objects, including cotton swabs, into your ear. You can clean the opening of your ear canal with a washcloth or facial tissue.  Follow instructions from your health care  provider about cleaning your ears. Do not over-clean your ears.  Drink enough fluid to keep your urine clear or pale yellow. This will help to thin the earwax.  Keep all follow-up visits as told by your health care provider. If earwax builds up in your ears often or if you use hearing aids, consider seeing your health care provider for routine, preventive ear cleanings. Ask your health care provider how often you should schedule your cleanings.  If you have hearing aids, clean them according to instructions from the manufacturer and your health care provider. Contact a health care provider if:  You have ear pain.  You develop a fever.  You have blood, pus, or other fluid coming from your ear.  You have hearing loss.  You have ringing in your ears that does not go away.  Your symptoms do not improve with treatment.  You feel like the room is spinning (vertigo). Summary  Earwax can build up in the ear and cause discomfort or hearing loss.  The most common symptoms of this condition include reduced or muffled hearing and a feeling of fullness in the ear or feeling that the ear is plugged.  This condition may be diagnosed based on your symptoms, your medical history, and an ear exam.  This condition may be treated by using ear drops to soften the earwax or by having the earwax removed by a health care provider.  Do not put any   objects, including cotton swabs, into your ear. You can clean the opening of your ear canal with a washcloth or facial tissue. This information is not intended to replace advice given to you by your health care provider. Make sure you discuss any questions you have with your health care provider. Document Released: 05/06/2004 Document Revised: 03/11/2017 Document Reviewed: 06/09/2016 Elsevier Patient Education  2020 [employer]  

## 2019-02-08 NOTE — Progress Notes (Signed)
   Subjective:    Patient ID: Scott [name], male    DOB: 10-10-1921, 85 y.o.   MRN: [id number]   Chief Complaint: Check ears   HPI Patient is brought in by his daughter c/o some hearing loss- usually comes form cerumen impaction.   Review of Systems  Constitutional: Negative for activity change and appetite change.  HENT: Negative.   Eyes: Negative for pain.  Respiratory: Negative for shortness of breath.   Cardiovascular: Negative for chest pain, palpitations and leg swelling.  Gastrointestinal: Negative for abdominal pain.  Endocrine: Negative for polydipsia.  Genitourinary: Negative.   Skin: Negative for rash.  Neurological: Negative for dizziness, weakness and headaches.  Hematological: Does not bruise/bleed easily.  Psychiatric/Behavioral: Negative.   All other systems reviewed and are negative.      Objective:   Physical Exam Vitals signs and nursing note reviewed.  Constitutional:      Appearance: Normal appearance.  HENT:     Right Ear: There is impacted cerumen.     Left Ear: There is impacted cerumen.  Cardiovascular:     Rate and Rhythm: Rhythm irregular.     Heart sounds: Normal heart sounds.  Skin:    General: Skin is warm.  Neurological:     General: No focal deficit present.     Mental Status: He is alert and oriented to person, place, and time.  Psychiatric:        Mood and Affect: Mood normal.        Behavior: Behavior normal.    BP (!) 170/83   Pulse 75   Temp (!) 96.4 F (35.8 C) (Temporal)   Resp 20   SpO2 90%   S/P irrigation- bil TM's clear       Assessment & Plan:  Scott [name] Standing in today with chief complaint of Check ears   1. Bilateral impacted cerumen [place] 2-3 x a week  Mary-Margaret Hassell Done, FNP

## 2019-02-08 NOTE — Telephone Encounter (Signed)
VM from [facility] w/ [facility] Today's INR 2.7

## 2019-02-09 ENCOUNTER — Telehealth: Payer: Self-pay | Admitting: Family Medicine

## 2019-02-09 NOTE — Telephone Encounter (Signed)
Home health nurse aware 

## 2019-02-09 NOTE — Telephone Encounter (Signed)
Description   continue to take 5 mg(2 pills) on Fridays and 2.5 mg (1 pill) the rest of the week  INR today is 2.7  (goal is 2-3)  Recheck in 2 weeks     Caryl Pina, MD [place] 02/09/2019, 9:35 AM

## 2019-02-13 DIAGNOSIS — I252 Old myocardial infarction: Secondary | ICD-10-CM | POA: Diagnosis not present

## 2019-02-13 DIAGNOSIS — M109 Gout, unspecified: Secondary | ICD-10-CM | POA: Diagnosis not present

## 2019-02-13 DIAGNOSIS — I739 Peripheral vascular disease, unspecified: Secondary | ICD-10-CM | POA: Diagnosis not present

## 2019-02-13 DIAGNOSIS — L03115 Cellulitis of right lower limb: Secondary | ICD-10-CM | POA: Diagnosis not present

## 2019-02-13 DIAGNOSIS — I2510 Atherosclerotic heart disease of native coronary artery without angina pectoris: Secondary | ICD-10-CM | POA: Diagnosis not present

## 2019-02-13 DIAGNOSIS — M150 Primary generalized (osteo)arthritis: Secondary | ICD-10-CM | POA: Diagnosis not present

## 2019-02-14 ENCOUNTER — Other Ambulatory Visit: Payer: Self-pay | Admitting: Family Medicine

## 2019-02-14 ENCOUNTER — Other Ambulatory Visit: Payer: Self-pay | Admitting: Family

## 2019-02-16 ENCOUNTER — Ambulatory Visit (INDEPENDENT_AMBULATORY_CARE_PROVIDER_SITE_OTHER): Payer: Medicare Other

## 2019-02-16 ENCOUNTER — Other Ambulatory Visit: Payer: Self-pay

## 2019-02-16 ENCOUNTER — Telehealth: Payer: Self-pay | Admitting: *Deleted

## 2019-02-16 DIAGNOSIS — I252 Old myocardial infarction: Secondary | ICD-10-CM

## 2019-02-16 DIAGNOSIS — I130 Hypertensive heart and chronic kidney disease with heart failure and stage 1 through stage 4 chronic kidney disease, or unspecified chronic kidney disease: Secondary | ICD-10-CM | POA: Diagnosis not present

## 2019-02-16 DIAGNOSIS — I428 Other cardiomyopathies: Secondary | ICD-10-CM

## 2019-02-16 DIAGNOSIS — J449 Chronic obstructive pulmonary disease, unspecified: Secondary | ICD-10-CM | POA: Diagnosis not present

## 2019-02-16 DIAGNOSIS — I4891 Unspecified atrial fibrillation: Secondary | ICD-10-CM

## 2019-02-16 DIAGNOSIS — L03115 Cellulitis of right lower limb: Secondary | ICD-10-CM | POA: Diagnosis not present

## 2019-02-16 DIAGNOSIS — I5042 Chronic combined systolic (congestive) and diastolic (congestive) heart failure: Secondary | ICD-10-CM

## 2019-02-16 DIAGNOSIS — I739 Peripheral vascular disease, unspecified: Secondary | ICD-10-CM

## 2019-02-16 DIAGNOSIS — M50323 Other cervical disc degeneration at C6-C7 level: Secondary | ICD-10-CM

## 2019-02-16 DIAGNOSIS — I480 Paroxysmal atrial fibrillation: Secondary | ICD-10-CM

## 2019-02-16 DIAGNOSIS — N183 Chronic kidney disease, stage 3 unspecified: Secondary | ICD-10-CM | POA: Diagnosis not present

## 2019-02-16 DIAGNOSIS — J849 Interstitial pulmonary disease, unspecified: Secondary | ICD-10-CM

## 2019-02-16 DIAGNOSIS — I2510 Atherosclerotic heart disease of native coronary artery without angina pectoris: Secondary | ICD-10-CM

## 2019-02-16 DIAGNOSIS — I447 Left bundle-branch block, unspecified: Secondary | ICD-10-CM

## 2019-02-16 DIAGNOSIS — M109 Gout, unspecified: Secondary | ICD-10-CM | POA: Diagnosis not present

## 2019-02-16 DIAGNOSIS — Z86711 Personal history of pulmonary embolism: Secondary | ICD-10-CM

## 2019-02-16 DIAGNOSIS — G309 Alzheimer's disease, unspecified: Secondary | ICD-10-CM

## 2019-02-16 DIAGNOSIS — N400 Enlarged prostate without lower urinary tract symptoms: Secondary | ICD-10-CM

## 2019-02-16 DIAGNOSIS — E785 Hyperlipidemia, unspecified: Secondary | ICD-10-CM

## 2019-02-16 DIAGNOSIS — M150 Primary generalized (osteo)arthritis: Secondary | ICD-10-CM

## 2019-02-16 DIAGNOSIS — F0280 Dementia in other diseases classified elsewhere without behavioral disturbance: Secondary | ICD-10-CM

## 2019-02-16 NOTE — Telephone Encounter (Signed)
Caregiver aware.  States that patient had been off of his medication Monday, Tuesday and Wednesday.  He started taking it back yesterday due to possible oral surgery.  Please advise

## 2019-02-16 NOTE — Telephone Encounter (Signed)
Caregiver aware.

## 2019-02-16 NOTE — Telephone Encounter (Signed)
Yes go ahead and continue with the same plan, the doubling up today will get him back on the system and then go back to his normal dose.

## 2019-02-16 NOTE — Telephone Encounter (Signed)
Description   Take 2-1/2 tablets today and then continue to take 5 mg(2 pills) on Fridays and 2.5 mg (1 pill) the rest of the week  INR today is 1.8  (goal is 2-3)  Recheck in 2 weeks      Caryl Pina, MD [place] 02/16/2019, 2:19 PM

## 2019-02-16 NOTE — Telephone Encounter (Signed)
Fax received mdINR PT/INR self testing service Test date/time 02/16/19 12:50 pm INR 1.8

## 2019-02-19 ENCOUNTER — Other Ambulatory Visit: Payer: Self-pay | Admitting: Family Medicine

## 2019-02-20 ENCOUNTER — Telehealth: Payer: Self-pay | Admitting: Family Medicine

## 2019-02-20 NOTE — Chronic Care Management (AMB) (Signed)
°  Chronic Care Management   Outreach Note  02/20/2019 Name: KAINALU LADY MRN: [id number] DOB: [birth year]  Referred by: Dettinger, Fransisca Kaufmann, MD Reason for referral : Chronic Care Management (Initial CCM outreach)   An unsuccessful telephone outreach was attempted today. The patient was referred to the case management team by for assistance with care management and care coordination.   Follow Up Plan: The care management team will reach out to the patient again over the next 7 days.   [place], [place] [address] Direct Dial: [email or web address]  Website: [email or web address]

## 2019-02-21 ENCOUNTER — Telehealth: Payer: Self-pay | Admitting: *Deleted

## 2019-02-21 DIAGNOSIS — L03115 Cellulitis of right lower limb: Secondary | ICD-10-CM | POA: Diagnosis not present

## 2019-02-21 DIAGNOSIS — M150 Primary generalized (osteo)arthritis: Secondary | ICD-10-CM | POA: Diagnosis not present

## 2019-02-21 DIAGNOSIS — I2510 Atherosclerotic heart disease of native coronary artery without angina pectoris: Secondary | ICD-10-CM | POA: Diagnosis not present

## 2019-02-21 DIAGNOSIS — Z86711 Personal history of pulmonary embolism: Secondary | ICD-10-CM

## 2019-02-21 DIAGNOSIS — I4891 Unspecified atrial fibrillation: Secondary | ICD-10-CM

## 2019-02-21 DIAGNOSIS — I252 Old myocardial infarction: Secondary | ICD-10-CM | POA: Diagnosis not present

## 2019-02-21 DIAGNOSIS — I739 Peripheral vascular disease, unspecified: Secondary | ICD-10-CM | POA: Diagnosis not present

## 2019-02-21 DIAGNOSIS — M109 Gout, unspecified: Secondary | ICD-10-CM | POA: Diagnosis not present

## 2019-02-21 NOTE — Telephone Encounter (Signed)
Description   Take 2-1/2 tablets today and then continue to take 5 mg(2 pills) on Fridays and 2.5 mg (1 pill) the rest of the week  INR today is 2.1  (goal is 2-3)  Recheck in 2 weeks     Caryl Pina, MD [place] 02/21/2019, 4:51 PM

## 2019-02-21 NOTE — Telephone Encounter (Signed)
Patients son aware 

## 2019-02-21 NOTE — Telephone Encounter (Signed)
Fax received mdINR PT/INR self testing service Test date/time 02/21/19 12:58 pm INR 2.1

## 2019-02-22 NOTE — Chronic Care Management (AMB) (Signed)
Chronic Care Management   Note  02/22/2019 Name: Scott [name] MRN: [id number] DOB: 01/19/1922  Scott [name] is a 85 y.o. year old male who is a primary care patient of Dettinger, Fransisca Kaufmann, MD. I reached out to [employer] by phone today in response to a referral sent by Scott [name]'s health plan.     Scott [name] was given information about Chronic Care Management services today including:  1. CCM service includes personalized support from designated clinical staff supervised by his physician, including individualized plan of care and coordination with other care providers 2. 24/7 contact phone numbers for assistance for urgent and routine care needs. 3. Service will only be billed when office clinical staff spend 20 minutes or more in a month to coordinate care. 4. Only one practitioner may furnish and bill the service in a calendar month. 5. The patient may stop CCM services at any time (effective at the end of the month) by phone call to the office staff. 6. The patient will be responsible for cost sharing (co-pay) of up to 20% of the service fee (after annual deductible is met).  Patients caregiver Scott [name] did not agree to enrollment in care management services and does not wish to consider at this time.  Follow up plan: Scott [name] has been provided with contact information for the chronic care management team and has been advised to call with any health related questions or concerns.   [place], [place] [address] Direct Dial: [email or web address]  Website: [email or web address]

## 2019-02-23 ENCOUNTER — Ambulatory Visit: Payer: BC Managed Care – PPO | Admitting: Urology

## 2019-02-27 ENCOUNTER — Other Ambulatory Visit: Payer: Self-pay | Admitting: Family Medicine

## 2019-02-27 DIAGNOSIS — M79671 Pain in right foot: Secondary | ICD-10-CM

## 2019-02-27 DIAGNOSIS — I5042 Chronic combined systolic (congestive) and diastolic (congestive) heart failure: Secondary | ICD-10-CM

## 2019-02-27 DIAGNOSIS — I739 Peripheral vascular disease, unspecified: Secondary | ICD-10-CM

## 2019-02-28 ENCOUNTER — Other Ambulatory Visit: Payer: Self-pay | Admitting: *Deleted

## 2019-02-28 ENCOUNTER — Other Ambulatory Visit: Payer: Self-pay | Admitting: Family Medicine

## 2019-02-28 NOTE — Telephone Encounter (Signed)
Unless patient is on hospice or palliative and he will need to be seen in a visit for the hydrocodone.  We can refill the rest.

## 2019-02-28 NOTE — Telephone Encounter (Signed)
What is the name of the medication? ProAir HFA 108 (90 Base) MCG/ACT inhaler, Albuterol 2.5 MG/3ML  Have you contacted your pharmacy to request a refill? YES  Which pharmacy would you like this sent to? [facility]   Patient notified that their request is being sent to the clinical staff for review and that they should receive a call once it is complete. If they do not receive a call within 24 hours they can check with their pharmacy or our office.

## 2019-03-01 ENCOUNTER — Telehealth: Payer: Self-pay | Admitting: *Deleted

## 2019-03-01 DIAGNOSIS — I4891 Unspecified atrial fibrillation: Secondary | ICD-10-CM

## 2019-03-01 DIAGNOSIS — I4821 Permanent atrial fibrillation: Secondary | ICD-10-CM | POA: Diagnosis not present

## 2019-03-01 DIAGNOSIS — Z86711 Personal history of pulmonary embolism: Secondary | ICD-10-CM

## 2019-03-01 NOTE — Telephone Encounter (Signed)
Caregiver aware and verbalizes understanding.  

## 2019-03-01 NOTE — Telephone Encounter (Signed)
Closing done in Rx refill

## 2019-03-01 NOTE — Telephone Encounter (Signed)
Description   Increase to take 5 mg(2 pills) on Tuesdays and Fridays and 2.5 mg (1 pill) the rest of the week  INR today is 1.8  (goal is 2-3)  Recheck in 2 weeks     Caryl Pina, MD [place] 03/01/2019, 3:49 PM

## 2019-03-01 NOTE — Telephone Encounter (Signed)
Fax received mdINR PT/INR self testing service Test date/time 03/01/19 1:24 pm INR 1.8

## 2019-03-12 ENCOUNTER — Telehealth: Payer: Self-pay | Admitting: *Deleted

## 2019-03-12 DIAGNOSIS — Z86711 Personal history of pulmonary embolism: Secondary | ICD-10-CM

## 2019-03-12 DIAGNOSIS — I4891 Unspecified atrial fibrillation: Secondary | ICD-10-CM

## 2019-03-12 NOTE — Telephone Encounter (Signed)
Aid aware and verbalizes understanding.

## 2019-03-12 NOTE — Telephone Encounter (Signed)
Review and sign ,please.

## 2019-03-12 NOTE — Telephone Encounter (Signed)
Description   Increase to take 5 mg(2 pills) on Sundays, Tuesdays and Fridays and 2.5 mg (1 pill) the rest of the week  INR today is 1.7  (goal is 2-3)  Recheck in 2 weeks    Caryl Pina, MD [place] 03/12/2019, 4:37 PM

## 2019-03-12 NOTE — Telephone Encounter (Signed)
Fax received mdINR PT/INR self testing service Test date/time 03/09/19 5:20 pm INR 1.7

## 2019-03-15 ENCOUNTER — Other Ambulatory Visit: Payer: Self-pay | Admitting: Family Medicine

## 2019-03-16 ENCOUNTER — Telehealth: Payer: Self-pay | Admitting: *Deleted

## 2019-03-16 DIAGNOSIS — I4891 Unspecified atrial fibrillation: Secondary | ICD-10-CM

## 2019-03-16 DIAGNOSIS — Z86711 Personal history of pulmonary embolism: Secondary | ICD-10-CM

## 2019-03-16 NOTE — Telephone Encounter (Signed)
Fax received mdINR PT/INR self testing service Test date/time 03/15/19 7:40 pm INR 1.7

## 2019-03-16 NOTE — Telephone Encounter (Signed)
Description   Increase to take 5 mg(2 pills) on Sundays, Tuesdays, Wednesday and Fridays and 2.5 mg (1 pill) the rest of the week  INR today is 1.7  (goal is 2-3)  Recheck in 2 weeks      Caryl Pina, MD [place] 03/16/2019, 1:12 PM

## 2019-03-25 NOTE — Progress Notes (Signed)
Virtual Visit via Video Note   This visit type was conducted due to national recommendations for restrictions regarding the COVID-19 Pandemic (e.g. social distancing) in an effort to limit this patient's exposure and mitigate transmission in our community.  Due to his co-morbid illnesses, this patient is at least at moderate risk for complications without adequate follow up.  This format is felt to be most appropriate for this patient at this time.  All issues noted in this document were discussed and addressed.  A limited physical exam was performed with this format.  Please refer to the patient's chart for his consent to telehealth for [facility].   Date:  03/26/2019   ID:  Scott [name], DOB 05-May-1921, MRN [id number]  Patient Location: Home Provider Location: Home  PCP:  Dettinger, Fransisca Kaufmann, MD  Cardiologist:  Minus Breeding, MD  Electrophysiologist:  None   Evaluation Performed:  Follow-Up Visit  Chief Complaint:  Cardiomyopathy  History of Present Illness:    Scott [name] is a 85 y.o. male with who presents for follow up of  NICM (06/2012 cath with patent cors - EF 40%, 2014 20-25%, 45% 2016), HTN, chronic LBBB, postural syncope related to diuretics, EtOH abuse, and DVT.  He presents for routine follow up.     Since I last saw him he has done well.  He has a caregiver who spends most of the day with him.  He gets around a wheelchair in his house.  He can transfer from bed to chair to toilet.  He seems to be very clear mentally.  He is not had any new shortness of breath, PND or orthopnea.  He has had no new palpitations, presyncope or syncope.  He has had an amputation.  I do not think he is working with a prosthesis anymore.  His leg swelling apparently is much improved.  I was able to review a recent creatinine and he did have a bump in his creatinine but actually went back to baseline.  The patient does not have symptoms concerning for COVID-19 infection (fever, chills,  cough, or new shortness of breath).    Past Medical History:  Diagnosis Date  . Arthritis    "all over"  . Atrial fibrillation ([place])   . Benign localized prostatic hyperplasia with lower urinary tract symptoms (LUTS)   . Bradycardia 11/28/2015   Severe-HR in the 20s to 30s following intubation for urological procedure.  Marland Kitchen CAD- non obstructive disease by cath 3/14 cardiologist-  dr Christen Bedoya  . Chronic lower back pain   . COPD (chronic obstructive pulmonary disease) ([place])   . DDD (degenerative disc disease)   . Depression   . Dyspnea    w/ exertion and lying down (raise head)  . ETOH abuse   . GERD (gastroesophageal reflux disease)   . Glaucoma   . Glaucoma, both eyes   . History of bladder cancer urologist-- dr Jeffie Pollock   first dx 2012--  s/p TURBT's  . History of DVT of lower extremity 03/2013   bilateral   . History of pulmonary embolus (PE) 04/2013  . HTN (hypertension)   . Hyperlipidemia   . Hypertension   . LBBB (left bundle branch block)    chronic  . Non-ischemic cardiomyopathy ([place])    last echo 01-18-2017, ef 35-40%  . NSVT (nonsustained ventricular tachycardia) ([place])    a. NSVT 06/2012; NSVT also seen during 03/2013 adm. b. Med rx. Not candidate for ICD given adv age.  Marland Kitchen PAD (  peripheral artery disease) ([place])   . 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 PVCs (premature ventricular contractions)   . PVD (peripheral vascular disease) (HCC)    Right ABI .75, Left .78 (2006)  . Syncope 06/2012   a. Felt to be postural syncope related to diuretics 06/2012.  Marland Kitchen Systolic and diastolic CHF, chronic (HCC) cardiologsit-  dr Dionta Larke   a. NICM - patent cors 06/2012, EF 40% at that time. b. 03/2013 eval: EF 20-25%.  . Vision loss, left eye    due to glaucoma  . Wears glasses    Past Surgical History:  Procedure Laterality Date  . AMPUTATION Left 10/16/2014   Procedure: AMPUTATION ABOVE  KNEE- LEFT;  Surgeon: Mal Misty, MD;  Location: [facility];  Service: Vascular;  Laterality: Left;  . CARDIAC CATHETERIZATION  05-11-2004  DR [facility]   MINIMAL CORANARY PLAQUE/ NORMAL LVF/ EF 55%/  NON-OBSTRUCTIVE LAD 25%  . CARDIAC CATHETERIZATION  06/18/2012   dr Giankarlo Leamer   mild luminal irregularities of coronaries, ef 45-50%  . CARDIOVASCULAR STRESS TEST  10-15-2010   LOW RISK NUCLEAR STUDY/ NO EVIDENCE OF ISCHEMIA/ NORMAL EF  . CATARACT EXTRACTION W/ INTRAOCULAR LENS  IMPLANT, BILATERAL Bilateral   . CYSTOSCOPY  10/07/2011   Procedure: CYSTOSCOPY;  Surgeon: Malka So, MD;  Location: [facility];  Service: Urology;  Laterality: N/A;  . CYSTOSCOPY N/A 10/20/2018   Procedure: CYSTOSCOPY WITH FULGURATION OF PROSTATIC URETHRAL TUMOR;  Surgeon: [name], MD;  Location[facility];  Service: Urology;  Laterality: N/A;  . CYSTOSCOPY WITH BIOPSY  03/14/2012   Procedure: CYSTOSCOPY WITH BIOPSY;  Surgeon: Malka So, MD;  Location: [facility];  Service: Urology;  Laterality: N/A;  WITH FULGURATION  . CYSTOSCOPY WITH BIOPSY N/A 12/09/2016   Procedure: CYSTOSCOPY WITH BIOPSY AND FULGURATION;  Surgeon: [name], MD;  Location: [facility];  Service: Urology;  Laterality: N/A;  . CYSTOSCOPY WITH BIOPSY N/A 12/20/2017   Procedure: CYSTOSCOPY WITH BIOPSY WITH FULGURATION;  Surgeon: [name], MD;  Location: [facility];  Service: Urology;  Laterality: N/A;  . CYSTOSCOPY WITH BIOPSY N/A 06/09/2018   Procedure: CYSTOSCOPY WITH BIOPSY AND FULGURATION;  Surgeon: [name], MD;  Location: [facility];  Service: Urology;  Laterality: N/A;  . CYSTOSCOPY WITH FULGERATION N/A 01/20/2016   Procedure: CYSTOSCOPY, BIOPSY WITH FULGERATION OF URTHRAL TUMOR;  Surgeon: [name], MD;  Location[facility];  Service: Urology;  Laterality: N/A;  . CYSTOSCOPY WITH FULGERATION N/A 06/01/2016   Procedure: CYSTOSCOPY WITH FULGERATION urethral tumors and bladder neck;  Surgeon: [name], MD;  Location[facility];  Service: Urology;   Laterality: N/A;  . SHOULDER SURGERY Left 1970's  . TONSILLECTOMY    . TRANSTHORACIC ECHOCARDIOGRAM  01-18-2017   dr Crystale Giannattasio   moderate LVH, ef 35-40%, diffuse hypokinesis/  trivial AR and MR/  mild TR  . TRANSURETHRAL RESECTION OF BLADDER TUMOR  10/07/2011   Procedure: TRANSURETHRAL RESECTION OF BLADDER TUMOR (TURBT);  Surgeon: Malka So, MD;  Location[facility];  Service: Urology;  Laterality: N/A;  . TRANSURETHRAL RESECTION OF BLADDER TUMOR N/A 07/10/2015   Procedure: TRANSURETHRAL RESECTION OF BLADDER TUMOR (TURBT), CYSTOSCOPY ;  Surgeon: [name], MD;  Location[facility];  Service: Urology;  Laterality: N/A;  . TRANSURETHRAL RESECTION OF PROSTATE  03/14/2012   Procedure: TRANSURETHRAL RESECTION OF THE PROSTATE WITH GYRUS INSTRUMENTS;  Surgeon: Malka So,  MD;  Location[facility];  Service: Urology;  Laterality: N/A;     Current Meds  Medication Sig  . acetaminophen (TYLENOL) 325 MG tablet Take 2 tablets (650 mg total) by mouth every 6 (six) hours as needed for mild pain (or Fever >/= 101).  Marland Kitchen albuterol (PROVENTIL) (2.5 MG/3ML) 0.083% nebulizer solution NEBULIZE 1 VIAL EVERY 6 HOURS AS NEEDED FOR WHEEZING OR SHORTNESS OF BREATH (Patient taking differently: Take 2.5 mg by nebulization every 6 (six) hours as needed for wheezing or shortness of breath. )  . aspirin EC 81 MG EC tablet Take 1 tablet (81 mg total) by mouth daily.  Marland Kitchen BIDIL 20-37.5 MG tablet TAKE 1 TABLET BY MOUTH 3 TIMES DAILY.  . carvedilol (COREG) 6.25 MG tablet TAKE  (1)  TABLET TWICE A DAY.  . febuxostat (ULORIC) 40 MG tablet TAKE 1 TABLET DAILY  . fluticasone (FLONASE) 50 MCG/ACT nasal spray Place 1 spray into both nostrils daily as needed for allergies or rhinitis. (Patient taking differently: Place 1 spray into both nostrils at bedtime. )  . folic acid (FOLVITE) 1 MG tablet TAKE 1 TABLET DAILY  . furosemide (LASIX) 40 MG tablet Take 1 tablet (40 mg total) by mouth 2 (two) times daily.  Marland Kitchen gabapentin  (NEURONTIN) 100 MG capsule TAKE 2 CAPSULES TWICE A DAY  . HYDROcodone-acetaminophen (NORCO/VICODIN) 5-325 MG tablet Take 1 tablet by mouth 2 (two) times daily as needed for moderate pain.  Marland Kitchen ipratropium (ATROVENT) 0.02 % nebulizer solution Take 0.5 mg by nebulization every 6 (six) hours as needed for wheezing or shortness of breath.  . Ipratropium-Albuterol (COMBIVENT RESPIMAT) 20-100 MCG/ACT AERS respimat 1 puff every 4 hours as needed for cough, wheezing or shortness of breath (Needs to be seen before next refill)  . MYRBETRIQ 25 MG TB24 tablet Take 25 mg by mouth every morning.   . pantoprazole (PROTONIX) 40 MG tablet TAKE 1 TABLET DAILY  . PROAIR HFA 108 (90 Base) MCG/ACT inhaler 2 PUFFS EVERY 6 HOURS AS NEEDED FOR WHEEZING OR SHORTNESS OF BREATH  . sertraline (ZOLOFT) 50 MG tablet TAKE 1 TABLET IN THE MORNING  . simvastatin (ZOCOR) 10 MG tablet Take 1 tablet (10 mg total) by mouth at bedtime.  Marland Kitchen warfarin (COUMADIN) 2.5 MG tablet Take 2.5 mg by mouth daily except Mondays and Fridays take 5 mg     Allergies:   Lipitor [atorvastatin]   Social History   Tobacco Use  . Smoking status: Former Smoker    Packs/day: 1.00    Years: 35.00    Pack years: 35.00    Types: Cigarettes    Start date: 04/12/1942    Quit date: 04/13/1975    Years since quitting: 43.9  . Smokeless tobacco: Never Used  Substance Use Topics  . Alcohol use: Yes    Alcohol/week: 7.0 standard drinks    Types: 7 Cans of beer per week  . Drug use: No     Family Hx: The patient's family history includes Arthritis in his mother; Deep vein thrombosis in his father; Early death in his brother; Hypertension in his mother; Lung cancer in his sister.  ROS:   Please see the history of present illness.     All other systems reviewed and are negative.   Prior CV studies:   The following studies were reviewed today:  Labs  Labs/Other Tests and Data Reviewed:    EKG:  No ECG reviewed.  Recent Labs: 12/10/2018: B  Natriuretic Peptide 1,713.0 12/25/2018: Magnesium 2.0 01/30/2019:  ALT 14; BUN 29; Creatinine, Ser 1.64; Hemoglobin 13.4; Platelets 180; Potassium 3.9; Sodium 147   Recent Lipid Panel Lab Results  Component Value Date/Time   CHOL 171 08/03/2016 11:18 AM   TRIG 151 (H) 08/03/2016 11:18 AM   TRIG 194 (H) 03/26/2014 02:46 PM   HDL 41 08/03/2016 11:18 AM   HDL 35 (L) 03/26/2014 02:46 PM   CHOLHDL 4.2 08/03/2016 11:18 AM   CHOLHDL 2.6 11/02/2012 01:10 PM   LDLCALC 100 (H) 08/03/2016 11:18 AM   LDLCALC 42 05/17/2013 11:01 AM    Wt Readings from Last 3 Encounters:  01/26/19 190 lb (86.2 kg)  12/26/18 190 lb (86.2 kg)  12/12/18 151 lb 14.4 oz (68.9 kg)     Objective:    Vital Signs:  Pulse 70   Resp 18   SpO2 93% Comment: range: 89-93%   VITAL SIGNS:  reviewed GEN:  no acute distress NEURO:  alert and oriented x 3, no obvious focal deficit PSYCH:  normal affect  ASSESSMENT & PLAN:     Chronic Diastolic heart failure - By history he seems to be euvolemic.  Labs are unremarkable.  No change in therapy.   PVCs (premature ventricular contractions) -  He is not noticing these.  Has had no presyncope or syncope.  No change in therapy.   HTN (hypertension) -  The blood pressure is not able to be checked but they are getting a blood pressure cuff and I discussed this with him.   DVT/PULMONARY EMBOLI-  He has home INR therapy and he tolerates anticoagulation.   ATRIAL FIB - Mr.Scott [name] a CHA2DS2 - VASc score of 4.  He does not notice this rhythm.  He is up-to-date with blood work and has normal CBC.  LEG SWELLING - No change in therapy.  This is improved.  COVID-19 Education:  The signs and symptoms of COVID-19 were discussed with the patient and how to seek care for testing (follow up with PCP or arrange E-visit).  We discussed the vaccine in detail.   The importance of social distancing was discussed today.  Time:   Today, I have spent 25 minutes with the  patient with telehealth technology discussing the above problems.     Medication Adjustments/Labs and Tests Ordered: Current medicines are reviewed at length with the patient today.  Concerns regarding medicines are outlined above.  Including chart review with greater than half the time with the patient.  Tests Ordered: No orders of the defined types were placed in this encounter.   Medication Changes: No orders of the defined types were placed in this encounter.   Follow Up:  In Person in one year  Signed, Rubin Kai, MD  03/26/2019 4:23 PM    [place]

## 2019-03-26 ENCOUNTER — Telehealth: Payer: Self-pay | Admitting: *Deleted

## 2019-03-26 ENCOUNTER — Telehealth (INDEPENDENT_AMBULATORY_CARE_PROVIDER_SITE_OTHER): Payer: Medicare Other | Admitting: Cardiology

## 2019-03-26 ENCOUNTER — Encounter: Payer: Self-pay | Admitting: Cardiology

## 2019-03-26 ENCOUNTER — Telehealth: Payer: Self-pay

## 2019-03-26 VITALS — HR 70 | Resp 18

## 2019-03-26 DIAGNOSIS — I110 Hypertensive heart disease with heart failure: Secondary | ICD-10-CM | POA: Diagnosis not present

## 2019-03-26 DIAGNOSIS — I82403 Acute embolism and thrombosis of unspecified deep veins of lower extremity, bilateral: Secondary | ICD-10-CM

## 2019-03-26 DIAGNOSIS — Z86711 Personal history of pulmonary embolism: Secondary | ICD-10-CM

## 2019-03-26 DIAGNOSIS — I10 Essential (primary) hypertension: Secondary | ICD-10-CM

## 2019-03-26 DIAGNOSIS — I480 Paroxysmal atrial fibrillation: Secondary | ICD-10-CM

## 2019-03-26 DIAGNOSIS — I493 Ventricular premature depolarization: Secondary | ICD-10-CM

## 2019-03-26 DIAGNOSIS — I4891 Unspecified atrial fibrillation: Secondary | ICD-10-CM

## 2019-03-26 DIAGNOSIS — I5042 Chronic combined systolic (congestive) and diastolic (congestive) heart failure: Secondary | ICD-10-CM | POA: Diagnosis not present

## 2019-03-26 NOTE — Telephone Encounter (Signed)
Fax received mdINR PT/INR self testing service Test date/time 03/25/19 4:21 pm INR 1.9

## 2019-03-26 NOTE — Telephone Encounter (Signed)
Description   Continue to take 5 mg(2 pills) on Sundays, Tuesdays, Wednesday and Fridays and 2.5 mg (1 pill) the rest of the week  INR today is 1.9  (goal is 2-3), much better, almost on target Recheck in 2 weeks     Caryl Pina, MD [place] 03/26/2019, 2:16 PM

## 2019-03-26 NOTE — Telephone Encounter (Signed)
Attempted call to pt. Unable to LVM   Letter including After Visit Summary and any other necessary documents to be mailed to the patient's address on file.  Staff message sent to Medical Records pool to mail AVS to patient address.

## 2019-03-26 NOTE — Patient Instructions (Signed)
Medication Instructions:  Your physician recommends that you continue on your current medications as directed. Please refer to the Current Medication list given to you today.  *If you need a refill on your cardiac medications before your next appointment, please call your pharmacy*  Lab Work: none If you have labs (blood work) drawn today and your tests are completely normal, you will receive your results only by: Marland Kitchen MyChart Message (if you have MyChart) OR . A paper copy in the mail If you have any lab test that is abnormal or we need to change your treatment, we will call you to review the results.  Testing/Procedures: none  Follow-Up: At [facility], you and your health needs are our priority.  As part of our continuing mission to provide you with exceptional heart care, we have created designated Provider Care Teams.  These Care Teams include your primary Cardiologist (physician) and Advanced Practice Providers (APPs -  Physician Assistants and Nurse Practitioners) who all work together to provide you with the care you need, when you need it.  Your next appointment:   12 month(s)  The format for your next appointment:   Either In Person or Virtual  Provider:   Minus Breeding, MD

## 2019-03-28 ENCOUNTER — Other Ambulatory Visit: Payer: Self-pay | Admitting: Family Medicine

## 2019-03-30 ENCOUNTER — Other Ambulatory Visit: Payer: Self-pay | Admitting: Family Medicine

## 2019-03-30 ENCOUNTER — Telehealth: Payer: Self-pay

## 2019-03-30 DIAGNOSIS — I4891 Unspecified atrial fibrillation: Secondary | ICD-10-CM

## 2019-03-30 DIAGNOSIS — Z86711 Personal history of pulmonary embolism: Secondary | ICD-10-CM

## 2019-03-30 DIAGNOSIS — I5042 Chronic combined systolic (congestive) and diastolic (congestive) heart failure: Secondary | ICD-10-CM

## 2019-03-30 NOTE — Telephone Encounter (Signed)
Caregiver aware they are aware. He gave me [name] name the son who is POA. [phone]

## 2019-03-30 NOTE — Telephone Encounter (Signed)
PT/INR result faxed over today. INR today is 1.4. Please advise

## 2019-03-30 NOTE — Telephone Encounter (Signed)
Description   Increase to take 5 mg(2 pills) on every day except Tuesday and Saturday where he will take 2.5 mg (1 pill)  INR today is 1.4  (goal is 2-3) Recheck in 2 weeks     Please let family know that I would like to speak with them about whether or not we even want to continue on anticoagulation for stroke prevention due to his age and high probability that he could fall and have a bleed.  We may just want to discontinue the Coumadin.  I would like to speak with family or whoever is his POA about this. Caryl Pina, MD [facility] 03/30/2019, 2:53 PM

## 2019-03-30 NOTE — Telephone Encounter (Signed)
Attempted to call son and left a message, did not answer.  If he calls back then let me know.

## 2019-04-09 ENCOUNTER — Telehealth: Payer: Self-pay | Admitting: *Deleted

## 2019-04-09 DIAGNOSIS — Z86711 Personal history of pulmonary embolism: Secondary | ICD-10-CM

## 2019-04-09 DIAGNOSIS — I4891 Unspecified atrial fibrillation: Secondary | ICD-10-CM

## 2019-04-09 NOTE — Telephone Encounter (Signed)
Caregiver aware and verbalizes understanding.  

## 2019-04-09 NOTE — Telephone Encounter (Signed)
Fax received mdINR PT/INR self testing service Test date/time 04/07/19 12:27 pm INR 1.8

## 2019-04-09 NOTE — Telephone Encounter (Signed)
Subtherapeutic INR.  Last instructions: Increase to take 5 mg(2 pills) on every day except Tuesday and Saturday where he will take 2.5 mg (1 pill)  RECOMMEND increasing to 5mg  daily except Saturdays.  He will take 2.5mg  on Saturdays.  Recheck in 1-2 weeks.

## 2019-04-10 ENCOUNTER — Other Ambulatory Visit: Payer: Self-pay | Admitting: Family Medicine

## 2019-04-11 ENCOUNTER — Other Ambulatory Visit: Payer: Self-pay | Admitting: Family Medicine

## 2019-04-19 ENCOUNTER — Telehealth: Payer: Self-pay | Admitting: *Deleted

## 2019-04-19 ENCOUNTER — Other Ambulatory Visit: Payer: Self-pay

## 2019-04-19 ENCOUNTER — Telehealth (HOSPITAL_COMMUNITY): Payer: Self-pay

## 2019-04-19 ENCOUNTER — Telehealth: Payer: Self-pay | Admitting: Family Medicine

## 2019-04-19 DIAGNOSIS — I739 Peripheral vascular disease, unspecified: Secondary | ICD-10-CM

## 2019-04-19 DIAGNOSIS — Z86711 Personal history of pulmonary embolism: Secondary | ICD-10-CM

## 2019-04-19 DIAGNOSIS — I4891 Unspecified atrial fibrillation: Secondary | ICD-10-CM

## 2019-04-19 NOTE — Telephone Encounter (Signed)
I have not seen this patient in almost a year now.  I actually reached out to patient's prosthesis provider in February of last year who had no additional recommendations for me at that time.  Please have him follow-up with his PCP for this need.  It sounds like he is going to need a more detailed note.

## 2019-04-19 NOTE — Telephone Encounter (Signed)
This is in the HPI of the 05/2018 note : "He has had problems with the medial aspect digging into the groin area and quite a bit of chafing."  I'm not sure what else I can add

## 2019-04-19 NOTE — Telephone Encounter (Signed)
Fax received mdINR PT/INR self testing service Test date/time 04/19/19 12:47 pm INR 2.4

## 2019-04-19 NOTE — Telephone Encounter (Signed)
Claim is from the 05/16/18 office visit, note needs a little bit more detail about why you recommended a new prothesis at that time, why it did not fit properly at that visit, ie weight loss, or resizing d/t the chafing etc, or anything from PT's notes can be included.

## 2019-04-19 NOTE — Telephone Encounter (Signed)
Description   Continue to take 5 mg(2 pills) on every day except Saturday where he will take 2.5 mg (1 pill)  INR today is 2.4  (goal is 2-3) Recheck in 2 weeks      Caryl Pina, MD [place] 04/19/2019, 4:53 PM

## 2019-04-19 NOTE — Telephone Encounter (Signed)

## 2019-04-19 NOTE — Telephone Encounter (Signed)
On going claim that is needing notes updated needing more detailed information for replacement needed after only being a year old.

## 2019-04-20 ENCOUNTER — Ambulatory Visit: Payer: BC Managed Care – PPO | Admitting: Vascular Surgery

## 2019-04-20 ENCOUNTER — Encounter (HOSPITAL_COMMUNITY): Payer: BC Managed Care – PPO

## 2019-04-26 ENCOUNTER — Other Ambulatory Visit: Payer: Self-pay | Admitting: Family Medicine

## 2019-04-26 DIAGNOSIS — I5042 Chronic combined systolic (congestive) and diastolic (congestive) heart failure: Secondary | ICD-10-CM

## 2019-04-27 ENCOUNTER — Telehealth: Payer: Self-pay | Admitting: *Deleted

## 2019-04-27 DIAGNOSIS — I4891 Unspecified atrial fibrillation: Secondary | ICD-10-CM

## 2019-04-27 DIAGNOSIS — Z86711 Personal history of pulmonary embolism: Secondary | ICD-10-CM

## 2019-04-27 NOTE — Telephone Encounter (Signed)
Aware. 

## 2019-04-27 NOTE — Telephone Encounter (Signed)
Fax received mdINR PT/INR self testing service Test date/time 04/27/19 1:41 pm INR 3.0

## 2019-04-27 NOTE — Telephone Encounter (Signed)
Description   Continue to take 5 mg(2 pills) on every day except Saturday where he will take 2.5 mg (1 pill), borderline but no changes today  INR today is 3.0  (goal is 2-3) Recheck in 2 weeks     Caryl Pina, MD [place] 04/27/2019, 2:16 PM

## 2019-04-28 ENCOUNTER — Other Ambulatory Visit: Payer: Self-pay | Admitting: Family Medicine

## 2019-05-07 ENCOUNTER — Telehealth: Payer: Self-pay | Admitting: *Deleted

## 2019-05-07 DIAGNOSIS — Z86711 Personal history of pulmonary embolism: Secondary | ICD-10-CM

## 2019-05-07 DIAGNOSIS — I4891 Unspecified atrial fibrillation: Secondary | ICD-10-CM

## 2019-05-07 NOTE — Telephone Encounter (Signed)
Fax received mdINR PT/INR self testing service Test date/time 05/05/19 9:16 am INR 2.1

## 2019-05-08 ENCOUNTER — Telehealth: Payer: Self-pay | Admitting: *Deleted

## 2019-05-08 DIAGNOSIS — I4821 Permanent atrial fibrillation: Secondary | ICD-10-CM | POA: Diagnosis not present

## 2019-05-08 DIAGNOSIS — Z86711 Personal history of pulmonary embolism: Secondary | ICD-10-CM

## 2019-05-08 DIAGNOSIS — I4891 Unspecified atrial fibrillation: Secondary | ICD-10-CM

## 2019-05-08 NOTE — Telephone Encounter (Signed)
Description   Continue to take 5 mg(2 pills) on every day except Saturday where he will take 2.5 mg (1 pill), borderline but no changes today  INR today is 2.1  (goal is 2-3) Recheck in 2 weeks     Caryl Pina, MD [facility] 05/08/2019, 9:32 AM

## 2019-05-08 NOTE — Telephone Encounter (Signed)
Fax received mdINR PT/INR self testing service Test date/time 05/08/19 8:48 am INR 2.1

## 2019-05-08 NOTE — Telephone Encounter (Signed)
Continue current regimen as INR of 2.1 is within his goal of 2-3; re-check in 2 weeks.

## 2019-05-08 NOTE — Telephone Encounter (Signed)
Caregiver aware and verbalizes understanding.  

## 2019-05-08 NOTE — Telephone Encounter (Signed)
Refer to previous note.

## 2019-05-15 ENCOUNTER — Other Ambulatory Visit: Payer: Self-pay | Admitting: Family

## 2019-05-15 ENCOUNTER — Other Ambulatory Visit: Payer: Self-pay | Admitting: Family Medicine

## 2019-05-16 ENCOUNTER — Telehealth: Payer: Self-pay | Admitting: *Deleted

## 2019-05-16 NOTE — Telephone Encounter (Signed)
Proair HFA 90 mcg INH-is not covered on pt's insurance formulary. No alternatives were given.

## 2019-05-17 ENCOUNTER — Telehealth: Payer: Self-pay | Admitting: *Deleted

## 2019-05-17 DIAGNOSIS — I4891 Unspecified atrial fibrillation: Secondary | ICD-10-CM

## 2019-05-17 DIAGNOSIS — Z86711 Personal history of pulmonary embolism: Secondary | ICD-10-CM

## 2019-05-17 MED ORDER — ALBUTEROL SULFATE HFA 108 (90 BASE) MCG/ACT IN AERS: 2.0000 | INHALATION_SPRAY | Freq: Four times a day (QID) | RESPIRATORY_TRACT | 3 refills | Status: DC | PRN

## 2019-05-17 NOTE — Telephone Encounter (Signed)
Caregiver aware aware and verbalized understanding.

## 2019-05-17 NOTE — Telephone Encounter (Signed)
Patient aware and verbalized understanding. °

## 2019-05-17 NOTE — Telephone Encounter (Signed)
Description   Take 3 pills or 7.5 mg tomorrow and then continue to take 5 mg(2 pills) on every day except Saturday where he will take 2.5 mg (1 pill),  INR today is 1.9  (goal is 2-3) Recheck in 2 weeks      Caryl Pina, MD [place] 05/17/2019, 3:43 PM

## 2019-05-17 NOTE — Telephone Encounter (Signed)
Please let the patient know that I tried to send the generic for Ventolin

## 2019-05-17 NOTE — Telephone Encounter (Signed)
Fax received mdINR PT/INR self testing service Test date/time 05/17/19 2:56 pm INR 1.9

## 2019-05-18 DIAGNOSIS — Z23 Encounter for immunization: Secondary | ICD-10-CM | POA: Diagnosis not present

## 2019-05-24 ENCOUNTER — Other Ambulatory Visit: Payer: Self-pay | Admitting: Family Medicine

## 2019-05-29 ENCOUNTER — Telehealth: Payer: Self-pay | Admitting: *Deleted

## 2019-05-29 NOTE — Telephone Encounter (Signed)
No new orders placed-continue current regimen, will close encounter.

## 2019-05-29 NOTE — Telephone Encounter (Signed)
Home INR today is 2.0

## 2019-05-29 NOTE — Telephone Encounter (Signed)
Continue current regimen as he is at his goal (2-3). Re-check in 2 weeks.

## 2019-05-30 ENCOUNTER — Other Ambulatory Visit: Payer: Self-pay | Admitting: Family Medicine

## 2019-05-30 DIAGNOSIS — I5042 Chronic combined systolic (congestive) and diastolic (congestive) heart failure: Secondary | ICD-10-CM

## 2019-06-01 ENCOUNTER — Ambulatory Visit: Payer: BC Managed Care – PPO | Admitting: Vascular Surgery

## 2019-06-01 ENCOUNTER — Encounter (HOSPITAL_COMMUNITY): Payer: BC Managed Care – PPO

## 2019-06-07 ENCOUNTER — Telehealth: Payer: Self-pay | Admitting: *Deleted

## 2019-06-07 DIAGNOSIS — Z86711 Personal history of pulmonary embolism: Secondary | ICD-10-CM

## 2019-06-07 DIAGNOSIS — I4891 Unspecified atrial fibrillation: Secondary | ICD-10-CM

## 2019-06-07 NOTE — Telephone Encounter (Signed)
Description   Take 3 tablets tomorrow, 7.5 mg and increase to take 5 mg(2 pills) on every day  INR today is 1.5  (goal is 2-3) Recheck in 2 weeks     Caryl Pina, MD [facility] 06/07/2019, 9:18 PM

## 2019-06-07 NOTE — Telephone Encounter (Signed)
Fax received mdINR PT/INR self testing service Test date/time 06/07/19 12:49 am INR 1.5

## 2019-06-08 NOTE — Telephone Encounter (Signed)
Patient aware and verbalized understanding. °

## 2019-06-15 DIAGNOSIS — Z23 Encounter for immunization: Secondary | ICD-10-CM | POA: Diagnosis not present

## 2019-06-18 ENCOUNTER — Telehealth: Payer: Self-pay | Admitting: *Deleted

## 2019-06-18 DIAGNOSIS — Z86711 Personal history of pulmonary embolism: Secondary | ICD-10-CM

## 2019-06-18 DIAGNOSIS — I4891 Unspecified atrial fibrillation: Secondary | ICD-10-CM

## 2019-06-18 DIAGNOSIS — I4821 Permanent atrial fibrillation: Secondary | ICD-10-CM | POA: Diagnosis not present

## 2019-06-18 NOTE — Telephone Encounter (Signed)
Description   Increased dose to take 7.5 mg on Tuesday and Friday and continue to take 5 mg(2 pills) on every other day INR today is 1.6  (goal is 2-3) Recheck in 2 weeks     Caryl Pina, MD [place] 06/18/2019, 10:13 PM

## 2019-06-18 NOTE — Telephone Encounter (Signed)
Fax received mdINR PT/INR self testing service Test date/time 06/18/19 12:13 pm INR 1.6

## 2019-06-19 NOTE — Telephone Encounter (Signed)
Patient aware and verbalized understanding. °

## 2019-06-22 ENCOUNTER — Other Ambulatory Visit: Payer: Self-pay | Admitting: Family Medicine

## 2019-06-25 ENCOUNTER — Telehealth: Payer: Self-pay | Admitting: *Deleted

## 2019-06-25 DIAGNOSIS — Z86711 Personal history of pulmonary embolism: Secondary | ICD-10-CM

## 2019-06-25 DIAGNOSIS — I4891 Unspecified atrial fibrillation: Secondary | ICD-10-CM

## 2019-06-25 NOTE — Telephone Encounter (Signed)
Description   Continue to take 7.5 mg on Tuesday and Friday and continue to take 5 mg(2 pills) on every other day INR today is 2.2  (goal is 2-3) Recheck in 2 weeks     Caryl Pina, MD [place] 06/25/2019, 4:22 PM

## 2019-06-25 NOTE — Telephone Encounter (Signed)
Patient aware and verbalized understanding. °

## 2019-06-25 NOTE — Telephone Encounter (Signed)
Fax received mdINR PT/INR self testing service Test date/time 06/25/19 12:50 pm INR 2.2

## 2019-06-27 ENCOUNTER — Other Ambulatory Visit: Payer: Self-pay | Admitting: Family Medicine

## 2019-06-27 DIAGNOSIS — I5042 Chronic combined systolic (congestive) and diastolic (congestive) heart failure: Secondary | ICD-10-CM

## 2019-07-01 ENCOUNTER — Other Ambulatory Visit: Payer: Self-pay

## 2019-07-01 ENCOUNTER — Encounter (HOSPITAL_COMMUNITY): Payer: Self-pay

## 2019-07-01 ENCOUNTER — Emergency Department (HOSPITAL_COMMUNITY): Payer: Medicare Other

## 2019-07-01 ENCOUNTER — Inpatient Hospital Stay (HOSPITAL_COMMUNITY)
Admission: EM | Admit: 2019-07-01 | Discharge: 2019-07-09 | DRG: 871 | Disposition: A | Discharge status: Skilled Nursing Facility | Payer: Medicare Other | Attending: Family Medicine | Admitting: Family Medicine

## 2019-07-01 DIAGNOSIS — I739 Peripheral vascular disease, unspecified: Secondary | ICD-10-CM

## 2019-07-01 DIAGNOSIS — R293 Abnormal posture: Secondary | ICD-10-CM | POA: Diagnosis not present

## 2019-07-01 DIAGNOSIS — Z8551 Personal history of malignant neoplasm of bladder: Secondary | ICD-10-CM | POA: Diagnosis not present

## 2019-07-01 DIAGNOSIS — I428 Other cardiomyopathies: Secondary | ICD-10-CM

## 2019-07-01 DIAGNOSIS — Z66 Do not resuscitate: Secondary | ICD-10-CM | POA: Diagnosis present

## 2019-07-01 DIAGNOSIS — R4189 Other symptoms and signs involving cognitive functions and awareness: Secondary | ICD-10-CM | POA: Diagnosis not present

## 2019-07-01 DIAGNOSIS — Z515 Encounter for palliative care: Secondary | ICD-10-CM | POA: Diagnosis not present

## 2019-07-01 DIAGNOSIS — I5041 Acute combined systolic (congestive) and diastolic (congestive) heart failure: Secondary | ICD-10-CM | POA: Diagnosis not present

## 2019-07-01 DIAGNOSIS — I447 Left bundle-branch block, unspecified: Secondary | ICD-10-CM | POA: Diagnosis present

## 2019-07-01 DIAGNOSIS — N401 Enlarged prostate with lower urinary tract symptoms: Secondary | ICD-10-CM | POA: Diagnosis present

## 2019-07-01 DIAGNOSIS — A419 Sepsis, unspecified organism: Secondary | ICD-10-CM | POA: Diagnosis not present

## 2019-07-01 DIAGNOSIS — I2510 Atherosclerotic heart disease of native coronary artery without angina pectoris: Secondary | ICD-10-CM | POA: Diagnosis present

## 2019-07-01 DIAGNOSIS — M6281 Muscle weakness (generalized): Secondary | ICD-10-CM | POA: Diagnosis not present

## 2019-07-01 DIAGNOSIS — M159 Polyosteoarthritis, unspecified: Secondary | ICD-10-CM | POA: Diagnosis present

## 2019-07-01 DIAGNOSIS — Z87891 Personal history of nicotine dependence: Secondary | ICD-10-CM

## 2019-07-01 DIAGNOSIS — Z89612 Acquired absence of left leg above knee: Secondary | ICD-10-CM

## 2019-07-01 DIAGNOSIS — I6523 Occlusion and stenosis of bilateral carotid arteries: Secondary | ICD-10-CM | POA: Diagnosis not present

## 2019-07-01 DIAGNOSIS — Z7901 Long term (current) use of anticoagulants: Secondary | ICD-10-CM

## 2019-07-01 DIAGNOSIS — Z86718 Personal history of other venous thrombosis and embolism: Secondary | ICD-10-CM | POA: Diagnosis not present

## 2019-07-01 DIAGNOSIS — Z86711 Personal history of pulmonary embolism: Secondary | ICD-10-CM

## 2019-07-01 DIAGNOSIS — R0600 Dyspnea, unspecified: Secondary | ICD-10-CM | POA: Diagnosis not present

## 2019-07-01 DIAGNOSIS — F0390 Unspecified dementia without behavioral disturbance: Secondary | ICD-10-CM | POA: Diagnosis present

## 2019-07-01 DIAGNOSIS — I480 Paroxysmal atrial fibrillation: Secondary | ICD-10-CM | POA: Diagnosis present

## 2019-07-01 DIAGNOSIS — E785 Hyperlipidemia, unspecified: Secondary | ICD-10-CM | POA: Diagnosis present

## 2019-07-01 DIAGNOSIS — K219 Gastro-esophageal reflux disease without esophagitis: Secondary | ICD-10-CM | POA: Diagnosis present

## 2019-07-01 DIAGNOSIS — R404 Transient alteration of awareness: Secondary | ICD-10-CM | POA: Diagnosis not present

## 2019-07-01 DIAGNOSIS — F445 Conversion disorder with seizures or convulsions: Secondary | ICD-10-CM | POA: Diagnosis not present

## 2019-07-01 DIAGNOSIS — I130 Hypertensive heart and chronic kidney disease with heart failure and stage 1 through stage 4 chronic kidney disease, or unspecified chronic kidney disease: Secondary | ICD-10-CM | POA: Diagnosis present

## 2019-07-01 DIAGNOSIS — I6503 Occlusion and stenosis of bilateral vertebral arteries: Secondary | ICD-10-CM | POA: Diagnosis not present

## 2019-07-01 DIAGNOSIS — Z7401 Bed confinement status: Secondary | ICD-10-CM | POA: Diagnosis not present

## 2019-07-01 DIAGNOSIS — N1831 Chronic kidney disease, stage 3a: Secondary | ICD-10-CM | POA: Diagnosis not present

## 2019-07-01 DIAGNOSIS — R1312 Dysphagia, oropharyngeal phase: Secondary | ICD-10-CM | POA: Diagnosis not present

## 2019-07-01 DIAGNOSIS — Z8249 Family history of ischemic heart disease and other diseases of the circulatory system: Secondary | ICD-10-CM

## 2019-07-01 DIAGNOSIS — N390 Urinary tract infection, site not specified: Secondary | ICD-10-CM | POA: Diagnosis present

## 2019-07-01 DIAGNOSIS — R41841 Cognitive communication deficit: Secondary | ICD-10-CM | POA: Diagnosis not present

## 2019-07-01 DIAGNOSIS — R000 Tachycardia, unspecified: Secondary | ICD-10-CM | POA: Diagnosis not present

## 2019-07-01 DIAGNOSIS — J441 Chronic obstructive pulmonary disease with (acute) exacerbation: Secondary | ICD-10-CM | POA: Diagnosis present

## 2019-07-01 DIAGNOSIS — J189 Pneumonia, unspecified organism: Secondary | ICD-10-CM | POA: Insufficient documentation

## 2019-07-01 DIAGNOSIS — R0902 Hypoxemia: Secondary | ICD-10-CM

## 2019-07-01 DIAGNOSIS — R569 Unspecified convulsions: Secondary | ICD-10-CM | POA: Diagnosis not present

## 2019-07-01 DIAGNOSIS — R0602 Shortness of breath: Secondary | ICD-10-CM | POA: Diagnosis not present

## 2019-07-01 DIAGNOSIS — I959 Hypotension, unspecified: Secondary | ICD-10-CM | POA: Diagnosis not present

## 2019-07-01 DIAGNOSIS — R319 Hematuria, unspecified: Secondary | ICD-10-CM | POA: Diagnosis present

## 2019-07-01 DIAGNOSIS — R69 Illness, unspecified: Secondary | ICD-10-CM | POA: Diagnosis not present

## 2019-07-01 DIAGNOSIS — I5021 Acute systolic (congestive) heart failure: Secondary | ICD-10-CM | POA: Diagnosis not present

## 2019-07-01 DIAGNOSIS — J9601 Acute respiratory failure with hypoxia: Secondary | ICD-10-CM | POA: Diagnosis present

## 2019-07-01 DIAGNOSIS — I70209 Unspecified atherosclerosis of native arteries of extremities, unspecified extremity: Secondary | ICD-10-CM | POA: Diagnosis present

## 2019-07-01 DIAGNOSIS — Z8261 Family history of arthritis: Secondary | ICD-10-CM

## 2019-07-01 DIAGNOSIS — N138 Other obstructive and reflux uropathy: Secondary | ICD-10-CM | POA: Diagnosis present

## 2019-07-01 DIAGNOSIS — Z7189 Other specified counseling: Secondary | ICD-10-CM

## 2019-07-01 DIAGNOSIS — I10 Essential (primary) hypertension: Secondary | ICD-10-CM | POA: Diagnosis not present

## 2019-07-01 DIAGNOSIS — A4189 Other specified sepsis: Secondary | ICD-10-CM | POA: Diagnosis not present

## 2019-07-01 DIAGNOSIS — N1832 Chronic kidney disease, stage 3b: Secondary | ICD-10-CM | POA: Diagnosis present

## 2019-07-01 DIAGNOSIS — M109 Gout, unspecified: Secondary | ICD-10-CM | POA: Diagnosis not present

## 2019-07-01 DIAGNOSIS — M79671 Pain in right foot: Secondary | ICD-10-CM

## 2019-07-01 DIAGNOSIS — I700 Atherosclerosis of aorta: Secondary | ICD-10-CM | POA: Diagnosis present

## 2019-07-01 DIAGNOSIS — Z789 Other specified health status: Secondary | ICD-10-CM | POA: Diagnosis not present

## 2019-07-01 DIAGNOSIS — Z888 Allergy status to other drugs, medicaments and biological substances status: Secondary | ICD-10-CM

## 2019-07-01 DIAGNOSIS — Z89512 Acquired absence of left leg below knee: Secondary | ICD-10-CM | POA: Diagnosis not present

## 2019-07-01 DIAGNOSIS — J439 Emphysema, unspecified: Secondary | ICD-10-CM | POA: Diagnosis not present

## 2019-07-01 DIAGNOSIS — R5381 Other malaise: Secondary | ICD-10-CM | POA: Diagnosis not present

## 2019-07-01 DIAGNOSIS — I252 Old myocardial infarction: Secondary | ICD-10-CM | POA: Diagnosis not present

## 2019-07-01 DIAGNOSIS — Z79899 Other long term (current) drug therapy: Secondary | ICD-10-CM

## 2019-07-01 DIAGNOSIS — E1151 Type 2 diabetes mellitus with diabetic peripheral angiopathy without gangrene: Secondary | ICD-10-CM | POA: Diagnosis present

## 2019-07-01 DIAGNOSIS — Z801 Family history of malignant neoplasm of trachea, bronchus and lung: Secondary | ICD-10-CM

## 2019-07-01 DIAGNOSIS — F339 Major depressive disorder, recurrent, unspecified: Secondary | ICD-10-CM | POA: Diagnosis not present

## 2019-07-01 DIAGNOSIS — I5042 Chronic combined systolic (congestive) and diastolic (congestive) heart failure: Secondary | ICD-10-CM | POA: Diagnosis present

## 2019-07-01 DIAGNOSIS — R0689 Other abnormalities of breathing: Secondary | ICD-10-CM

## 2019-07-01 DIAGNOSIS — I5022 Chronic systolic (congestive) heart failure: Secondary | ICD-10-CM | POA: Diagnosis present

## 2019-07-01 DIAGNOSIS — H409 Unspecified glaucoma: Secondary | ICD-10-CM | POA: Diagnosis present

## 2019-07-01 DIAGNOSIS — R9401 Abnormal electroencephalogram [EEG]: Secondary | ICD-10-CM | POA: Diagnosis not present

## 2019-07-01 DIAGNOSIS — Z20822 Contact with and (suspected) exposure to covid-19: Secondary | ICD-10-CM | POA: Diagnosis not present

## 2019-07-01 DIAGNOSIS — R29818 Other symptoms and signs involving the nervous system: Secondary | ICD-10-CM | POA: Diagnosis not present

## 2019-07-01 DIAGNOSIS — R4182 Altered mental status, unspecified: Secondary | ICD-10-CM | POA: Diagnosis not present

## 2019-07-01 DIAGNOSIS — J449 Chronic obstructive pulmonary disease, unspecified: Secondary | ICD-10-CM | POA: Diagnosis not present

## 2019-07-01 DIAGNOSIS — Z7982 Long term (current) use of aspirin: Secondary | ICD-10-CM

## 2019-07-01 LAB — CBC WITH DIFFERENTIAL/PLATELET
Abs Immature Granulocytes: 0.05 10*3/uL (ref 0.00–0.07)
Basophils Absolute: 0 10*3/uL (ref 0.0–0.1)
Basophils Relative: 0 %
Eosinophils Absolute: 0 10*3/uL (ref 0.0–0.5)
Eosinophils Relative: 0 %
HCT: 43.5 % (ref 39.0–52.0)
Hemoglobin: 13.7 g/dL (ref 13.0–17.0)
Immature Granulocytes: 0 %
Lymphocytes Relative: 8 %
Lymphs Abs: 1.1 10*3/uL (ref 0.7–4.0)
MCH: 27.6 pg (ref 26.0–34.0)
MCHC: 31.5 g/dL (ref 30.0–36.0)
MCV: 87.7 fL (ref 80.0–100.0)
Monocytes Absolute: 0.8 10*3/uL (ref 0.1–1.0)
Monocytes Relative: 5 %
Neutro Abs: 12.3 10*3/uL — ABNORMAL HIGH (ref 1.7–7.7)
Neutrophils Relative %: 87 %
Platelets: 158 10*3/uL (ref 150–400)
RBC: 4.96 MIL/uL (ref 4.22–5.81)
RDW: 15 % (ref 11.5–15.5)
WBC: 14.3 10*3/uL — ABNORMAL HIGH (ref 4.0–10.5)
nRBC: 0 % (ref 0.0–0.2)

## 2019-07-01 LAB — URINALYSIS, ROUTINE W REFLEX MICROSCOPIC
Bilirubin Urine: NEGATIVE
Glucose, UA: NEGATIVE mg/dL
Ketones, ur: NEGATIVE mg/dL
Leukocytes,Ua: NEGATIVE
Nitrite: NEGATIVE
Protein, ur: 30 mg/dL — AB
Specific Gravity, Urine: 1.011 (ref 1.005–1.030)
pH: 5 (ref 5.0–8.0)

## 2019-07-01 LAB — COMPREHENSIVE METABOLIC PANEL
ALT: 16 U/L (ref 0–44)
AST: 24 U/L (ref 15–41)
Albumin: 4.5 g/dL (ref 3.5–5.0)
Alkaline Phosphatase: 157 U/L — ABNORMAL HIGH (ref 38–126)
Anion gap: 11 (ref 5–15)
BUN: 55 mg/dL — ABNORMAL HIGH (ref 8–23)
CO2: 27 mmol/L (ref 22–32)
Calcium: 9.5 mg/dL (ref 8.9–10.3)
Chloride: 103 mmol/L (ref 98–111)
Creatinine, Ser: 2.5 mg/dL — ABNORMAL HIGH (ref 0.61–1.24)
GFR calc Af Amer: 24 mL/min — ABNORMAL LOW (ref >60)
GFR calc non Af Amer: 21 mL/min — ABNORMAL LOW (ref >60)
Glucose, Bld: 101 mg/dL — ABNORMAL HIGH (ref 70–99)
Potassium: 3.9 mmol/L (ref 3.5–5.1)
Sodium: 141 mmol/L (ref 135–145)
Total Bilirubin: 0.9 mg/dL (ref 0.3–1.2)
Total Protein: 8 g/dL (ref 6.5–8.1)

## 2019-07-01 LAB — LACTIC ACID, PLASMA
Lactic Acid, Venous: 1.4 mmol/L (ref 0.5–1.9)
Lactic Acid, Venous: 1.8 mmol/L (ref 0.5–1.9)

## 2019-07-01 LAB — BRAIN NATRIURETIC PEPTIDE: B Natriuretic Peptide: 1507 pg/mL — ABNORMAL HIGH (ref 0.0–100.0)

## 2019-07-01 LAB — APTT: aPTT: 39 seconds — ABNORMAL HIGH (ref 24–36)

## 2019-07-01 LAB — PROTIME-INR
INR: 2 — ABNORMAL HIGH (ref 0.8–1.2)
Prothrombin Time: 22.4 seconds — ABNORMAL HIGH (ref 11.4–15.2)

## 2019-07-01 LAB — TROPONIN I (HIGH SENSITIVITY)
Troponin I (High Sensitivity): 2252 ng/L (ref <18)
Troponin I (High Sensitivity): 1906 ng/L (ref <18)

## 2019-07-01 LAB — POC SARS CORONAVIRUS 2 AG -  ED: SARS Coronavirus 2 Ag: NEGATIVE

## 2019-07-01 LAB — PROCALCITONIN: Procalcitonin: 2.07 ng/mL

## 2019-07-01 MED ORDER — SODIUM CHLORIDE 0.9 % IV SOLN
1.0000 g | Freq: Once | INTRAVENOUS | Status: AC
  Administered 2019-07-01: 1 g via INTRAVENOUS
  Filled 2019-07-01: qty 10

## 2019-07-01 MED ORDER — FEBUXOSTAT 40 MG PO TABS
40.0000 mg | ORAL_TABLET | Freq: Every day | ORAL | Status: DC
  Administered 2019-07-01 – 2019-07-09 (×9): 40 mg via ORAL
  Filled 2019-07-01 (×10): qty 1

## 2019-07-01 MED ORDER — HYDRALAZINE HCL 25 MG PO TABS
37.5000 mg | ORAL_TABLET | Freq: Three times a day (TID) | ORAL | Status: DC
  Administered 2019-07-01 – 2019-07-09 (×21): 37.5 mg via ORAL
  Filled 2019-07-01 (×22): qty 2

## 2019-07-01 MED ORDER — SODIUM CHLORIDE 0.9 % IV SOLN
1.0000 g | INTRAVENOUS | Status: DC
  Administered 2019-07-02 – 2019-07-03 (×2): 1 g via INTRAVENOUS
  Filled 2019-07-01 (×2): qty 10

## 2019-07-01 MED ORDER — AZITHROMYCIN 250 MG PO TABS
250.0000 mg | ORAL_TABLET | Freq: Every day | ORAL | Status: AC
  Administered 2019-07-02 – 2019-07-05 (×4): 250 mg via ORAL
  Filled 2019-07-01 (×4): qty 1

## 2019-07-01 MED ORDER — WARFARIN SODIUM 2.5 MG PO TABS: 2.5000 mg | ORAL_TABLET | Freq: Every day | ORAL | Status: DC

## 2019-07-01 MED ORDER — SODIUM CHLORIDE 0.9% FLUSH
3.0000 mL | Freq: Two times a day (BID) | INTRAVENOUS | Status: DC
  Administered 2019-07-01 – 2019-07-09 (×11): 3 mL via INTRAVENOUS

## 2019-07-01 MED ORDER — SODIUM CHLORIDE 0.9 % IV SOLN: 250.0000 mL | INTRAVENOUS | Status: DC | PRN

## 2019-07-01 MED ORDER — ASPIRIN EC 81 MG PO TBEC
81.0000 mg | DELAYED_RELEASE_TABLET | Freq: Every day | ORAL | Status: DC
  Administered 2019-07-01 – 2019-07-07 (×7): 81 mg via ORAL
  Filled 2019-07-01 (×8): qty 1

## 2019-07-01 MED ORDER — MIRABEGRON ER 25 MG PO TB24
25.0000 mg | ORAL_TABLET | Freq: Every morning | ORAL | Status: DC
  Administered 2019-07-02 – 2019-07-09 (×7): 25 mg via ORAL
  Filled 2019-07-01 (×8): qty 1

## 2019-07-01 MED ORDER — SODIUM CHLORIDE 0.9 % IV SOLN
500.0000 mg | INTRAVENOUS | Status: DC
  Administered 2019-07-01: 500 mg via INTRAVENOUS
  Filled 2019-07-01: qty 500

## 2019-07-01 MED ORDER — WARFARIN SODIUM 2.5 MG PO TABS
2.5000 mg | ORAL_TABLET | ORAL | Status: DC
  Administered 2019-07-01: 2.5 mg via ORAL
  Filled 2019-07-01 (×2): qty 1

## 2019-07-01 MED ORDER — FOLIC ACID 1 MG PO TABS
1.0000 mg | ORAL_TABLET | Freq: Every day | ORAL | Status: DC
  Administered 2019-07-02 – 2019-07-08 (×7): 1 mg via ORAL
  Filled 2019-07-01 (×7): qty 1

## 2019-07-01 MED ORDER — CARVEDILOL 3.125 MG PO TABS
6.2500 mg | ORAL_TABLET | Freq: Two times a day (BID) | ORAL | Status: DC
  Administered 2019-07-02 – 2019-07-09 (×13): 6.25 mg via ORAL
  Filled 2019-07-01 (×16): qty 2

## 2019-07-01 MED ORDER — FUROSEMIDE 10 MG/ML IJ SOLN: 40.0000 mg | Freq: Two times a day (BID) | INTRAMUSCULAR | Status: DC

## 2019-07-01 MED ORDER — GABAPENTIN 100 MG PO CAPS
200.0000 mg | ORAL_CAPSULE | Freq: Two times a day (BID) | ORAL | Status: DC
  Administered 2019-07-01 – 2019-07-05 (×8): 200 mg via ORAL
  Filled 2019-07-01 (×9): qty 2

## 2019-07-01 MED ORDER — SERTRALINE HCL 50 MG PO TABS
50.0000 mg | ORAL_TABLET | Freq: Every morning | ORAL | Status: DC
  Administered 2019-07-02 – 2019-07-09 (×8): 50 mg via ORAL
  Filled 2019-07-01 (×8): qty 1

## 2019-07-01 MED ORDER — SODIUM CHLORIDE 0.9 % IV BOLUS
1000.0000 mL | Freq: Once | INTRAVENOUS | Status: AC
  Administered 2019-07-01: 1000 mL via INTRAVENOUS

## 2019-07-01 MED ORDER — WARFARIN - PHARMACIST DOSING INPATIENT: Freq: Every day | Status: DC

## 2019-07-01 MED ORDER — ALBUTEROL SULFATE HFA 108 (90 BASE) MCG/ACT IN AERS: 2.0000 | INHALATION_SPRAY | Freq: Four times a day (QID) | RESPIRATORY_TRACT | Status: DC | PRN

## 2019-07-01 MED ORDER — SIMVASTATIN 20 MG PO TABS
10.0000 mg | ORAL_TABLET | Freq: Every day | ORAL | Status: DC
  Administered 2019-07-03 – 2019-07-08 (×5): 10 mg via ORAL
  Filled 2019-07-01 (×8): qty 1

## 2019-07-01 MED ORDER — SODIUM CHLORIDE 0.9% FLUSH: 3.0000 mL | INTRAVENOUS | Status: DC | PRN

## 2019-07-01 MED ORDER — ISOSORBIDE DINITRATE 20 MG PO TABS
20.0000 mg | ORAL_TABLET | Freq: Three times a day (TID) | ORAL | Status: DC
  Administered 2019-07-01 – 2019-07-09 (×21): 20 mg via ORAL
  Filled 2019-07-01 (×22): qty 1

## 2019-07-01 MED ORDER — IPRATROPIUM-ALBUTEROL 20-100 MCG/ACT IN AERS: 1.0000 | INHALATION_SPRAY | RESPIRATORY_TRACT | Status: DC | PRN

## 2019-07-01 MED ORDER — LEVALBUTEROL HCL 0.63 MG/3ML IN NEBU
INHALATION_SOLUTION | RESPIRATORY_TRACT | Status: AC
  Administered 2019-07-01: 0.63 mg
  Filled 2019-07-01: qty 3

## 2019-07-01 MED ORDER — IPRATROPIUM-ALBUTEROL 0.5-2.5 (3) MG/3ML IN SOLN
3.0000 mL | RESPIRATORY_TRACT | Status: DC | PRN
  Administered 2019-07-01 – 2019-07-06 (×4): 3 mL via RESPIRATORY_TRACT
  Filled 2019-07-01 (×4): qty 3

## 2019-07-01 MED ORDER — ONDANSETRON HCL 4 MG/2ML IJ SOLN: 4.0000 mg | Freq: Four times a day (QID) | INTRAMUSCULAR | Status: DC | PRN

## 2019-07-01 MED ORDER — ACETAMINOPHEN 325 MG PO TABS
650.0000 mg | ORAL_TABLET | Freq: Four times a day (QID) | ORAL | Status: DC | PRN
  Administered 2019-07-01 – 2019-07-02 (×2): 650 mg via ORAL
  Filled 2019-07-01 (×2): qty 2

## 2019-07-01 MED ORDER — ISOSORB DINITRATE-HYDRALAZINE 20-37.5 MG PO TABS: 1.0000 | ORAL_TABLET | Freq: Three times a day (TID) | ORAL | Status: DC

## 2019-07-01 MED ORDER — HYDROCODONE-ACETAMINOPHEN 5-325 MG PO TABS: 1.0000 | ORAL_TABLET | Freq: Two times a day (BID) | ORAL | Status: DC | PRN

## 2019-07-01 MED ORDER — IPRATROPIUM BROMIDE 0.02 % IN SOLN
RESPIRATORY_TRACT | Status: AC
  Administered 2019-07-01: 0.5 mg
  Filled 2019-07-01: qty 2.5

## 2019-07-01 MED ORDER — DONEPEZIL HCL 5 MG PO TABS
10.0000 mg | ORAL_TABLET | Freq: Every day | ORAL | Status: DC
  Administered 2019-07-01 – 2019-07-08 (×7): 10 mg via ORAL
  Filled 2019-07-01 (×7): qty 2

## 2019-07-01 MED ORDER — WARFARIN SODIUM 5 MG PO TABS
5.0000 mg | ORAL_TABLET | ORAL | Status: DC
  Administered 2019-07-02: 5 mg via ORAL
  Filled 2019-07-01: qty 1

## 2019-07-01 MED ORDER — POTASSIUM CHLORIDE CRYS ER 10 MEQ PO TBCR
10.0000 meq | EXTENDED_RELEASE_TABLET | Freq: Two times a day (BID) | ORAL | Status: DC
  Administered 2019-07-01 – 2019-07-06 (×9): 10 meq via ORAL
  Filled 2019-07-01 (×11): qty 1

## 2019-07-01 MED ORDER — FLUTICASONE PROPIONATE 50 MCG/ACT NA SUSP
1.0000 | Freq: Every day | NASAL | Status: DC
  Administered 2019-07-01 – 2019-07-08 (×6): 1 via NASAL
  Filled 2019-07-01 (×4): qty 16

## 2019-07-01 MED ORDER — FUROSEMIDE 10 MG/ML IJ SOLN
40.0000 mg | Freq: Three times a day (TID) | INTRAMUSCULAR | Status: DC
  Administered 2019-07-01 – 2019-07-02 (×3): 40 mg via INTRAVENOUS
  Filled 2019-07-01 (×3): qty 4

## 2019-07-01 MED ORDER — PANTOPRAZOLE SODIUM 40 MG PO TBEC
40.0000 mg | DELAYED_RELEASE_TABLET | Freq: Every day | ORAL | Status: DC
  Administered 2019-07-02 – 2019-07-09 (×8): 40 mg via ORAL
  Filled 2019-07-01 (×8): qty 1

## 2019-07-01 NOTE — ED Triage Notes (Signed)
Patient was noted to be lethargic this morning and unable to help dress himself, which is new. Per family/caretaker patient c/o shortness of breath and had O2 sat of 68% last night in which they gave him breathing treatment and O2 sats were 94%. Patient normal O2 sats are 92%. Patient's sat this morning 89% and another breathing treatment given. Patient alert to person and place. Patient is notable lethargic. Family states patient had similar episode approx 1year ago and had elevated BNP. Patient does have some swelling to right lower leg, nonpitting. 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  Patient does have dark colored urine with some odor. Denies any urinary complaints from patient. Denies any fevers.

## 2019-07-01 NOTE — Progress Notes (Addendum)
Patient came to floor with increased respirations low saturations given xopenex/ atrovent treatment . For wheezes ( mostly upper airway) . Placed on 6 liters oxygen. Patient appears to be in heart failure. Receiving lasix and home med's. BNP 1507, bun 55, creatine 2.50 , troponin 1906. Has hx of kidney failure, prostate ca, -- heart failure, afib, -- Lungs appear clear by x-ray --  Being treated for sepsis due to wbc 14.5 , received liter fluid in er and antibiotic.

## 2019-07-01 NOTE — Progress Notes (Signed)
Patient new admit to Unit 300. Upon arrival patients respirations 30, O2 sat 96% on 6L [place]. Crackles/wheezing noted. Respiratory at bedside, given breathing treatment. Patient alert, denies pain.  Mews Score yellow at this time. MD notified. Patient given scheduled Lasix. No new orders at this time.

## 2019-07-01 NOTE — ED Notes (Addendum)
Date and time results received: 07/01/19 1648 (use smartphrase ".now" to insert current time)  Test: troponin Critical Value: 2252  Name of Provider Notified: zammitt and Dr Veverly Fells  Orders Received? Or Actions Taken?:

## 2019-07-01 NOTE — Progress Notes (Signed)
ANTICOAGULATION CONSULT NOTE - Initial Up Consult   Pharmacy Consult for warfarin dosing  Indication: atrial fibrillation   Allergies  Allergen Reactions  . Lipitor [Atorvastatin] Other (See Comments)    Unknown rxn per pt      Patient Measurements: Last Weight  Most recent update: 07/01/2019  2:06 PM   Weight  66.2 kg (146 lb)           Body mass index is 24.3 kg/m. Scott [name]               Temp: 97.5 F (36.4 C) (03/21 1824) Temp Source: Oral (03/21 1824) BP: 131/85 (03/21 1900) Pulse Rate: 81 (03/21 1900)  Labs: Recent Labs    07/01/19 1452  HGB 13.7  HCT 43.5  PLT 158  APTT 39*  LABPROT 22.4*  INR 2.0*  CREATININE 2.50*    Estimated Creatinine Clearance: 15.4 mL/min (A) (by C-G formula based on SCr of 2.5 mg/dL (H)).     Medications:  (Not in a hospital admission)  Scheduled:  . aspirin  81 mg Oral Daily  . [START ON 07/02/2019] azithromycin  250 mg Oral Daily  . carvedilol  6.25 mg Oral BID WC  . donepezil  10 mg Oral QHS  . febuxostat  40 mg Oral Daily  . fluticasone  1 spray Each Nare QHS  . folic acid  1 mg Oral Daily  . furosemide  40 mg Intravenous Q8H  . gabapentin  200 mg Oral BID  . isosorbide-hydrALAZINE  1 tablet Oral TID  . [START ON 07/02/2019] mirabegron ER  25 mg Oral q morning - 10a  . pantoprazole  40 mg Oral Daily  . potassium chloride  10 mEq Oral BID  . [START ON 07/02/2019] sertraline  50 mg Oral q morning - 10a  . simvastatin  10 mg Oral q1800  . sodium chloride flush  3 mL Intravenous Q12H  . warfarin  2.5 mg Oral Once per day on Sun Tue Wed Thu Sat  . [START ON 07/02/2019] warfarin  5 mg Oral Once per day on Mon Fri  . [START ON 07/02/2019] Warfarin - Pharmacist Dosing Inpatient   Does not apply q1800   Infusions:  . sodium chloride    . azithromycin Stopped (07/01/19 1710)  . [START ON 07/02/2019] cefTRIAXone (ROCEPHIN)  IV     PRN: sodium chloride, acetaminophen, albuterol, HYDROcodone-acetaminophen,  Ipratropium-Albuterol, ondansetron (ZOFRAN) IV, sodium chloride flush Anti-infectives (From admission, onward)   Start     Dose/Rate Route Frequency Ordered Stop   07/02/19 1500  cefTRIAXone (ROCEPHIN) 1 g in sodium chloride 0.9 % 100 mL IVPB     1 g 200 mL/hr over 30 Minutes Intravenous Every 24 hours 07/01/19 1812     07/02/19 1000  azithromycin (ZITHROMAX) tablet 250 mg     250 mg Oral Daily 07/01/19 1812 07/06/19 0959   07/01/19 1500  cefTRIAXone (ROCEPHIN) 1 g in sodium chloride 0.9 % 100 mL IVPB     1 g 200 mL/hr over 30 Minutes Intravenous  Once 07/01/19 1452 07/01/19 1549   07/01/19 1500  azithromycin (ZITHROMAX) 500 mg in sodium chloride 0.9 % 250 mL IVPB     500 mg 250 mL/hr over 60 Minutes Intravenous Every 24 hours 07/01/19 1452        Goal of Therapy:  INR 2-3 Monitor platelets by anticoagulation protocol: Yes    Prior to Admission Warfarin Dosing:  [place] takes 2.5mg  of warfarin everyday of the  week except MF, takes 5mg       Admit INR was 2.0 Lab Results  Component Value Date   INR 2.0 (H) 07/01/2019   INR 2.9 (H) 12/26/2018   INR 2.7 (H) 12/25/2018    Assessment: Scott [name] a 86 y.o. male requires anticoagulation with warfarin for the indication of  atrial fibrillation. Warfarin will be initiated inpatient following pharmacy protocol per pharmacy consult. Patient most recent blood work is as follows: CBC Latest Ref Rng & Units 07/01/2019 01/30/2019 01/08/2019  WBC 4.0 - 10.5 K/uL 14.3(H) 4.6 4.4  Hemoglobin 13.0 - 17.0 g/dL 13.7 13.4 13.6  Hematocrit 39.0 - 52.0 % 43.5 41.1 43.2  Platelets 150 - 400 K/uL 158 180 209     Plan: Warfarin 2.5mg  po everyday except 5mg  on Mondays and Fridays Monitor CBC MWF with am labs   Monitor INR daily Monitor for signs and symptoms of bleeding   Donna Christen Cherelle Midkiff, PharmD, MBA, BCGP Clinical Pharmacist

## 2019-07-01 NOTE — Progress Notes (Signed)
Patient still seems to have increased work of breathing. f 28 hr ekg 104- pulse ox 63 saturation around 93 ?? Upper airway wheezes , breath sound diminished has had some kidney output in urine bag.

## 2019-07-01 NOTE — H&P (Signed)
History and Physical    Scott [name] [id number] DOB: [birth year] DOA: 07/01/2019  PCP: Dettinger, Fransisca Kaufmann, MD (Confirm with patient/family/NH records and if not entered, this has to be entered at [facility] point of entry) Patient coming from: home  I have personally briefly reviewed patient's old medical records in [place]  Chief Complaint: Shortness of breath  HPI: Scott [name] is a 86 y.o. male with medical history significant of DNR/DNI, hx of a fib on coumadin, CAD s/p MI 2019, left BKA, diabetes, presented to the hospital with fever, lethargy, and shortness of breath.  His caretaker at the bedside reports that for the past 3 days or so the patient has been complaining about shortness of breath.  They have noted that his home O2 sat has been dropping as low as 72% on room air.  Normally the patient is 93 to 94% on room air.  They feel like he was improving with albuterol treatments at home, but today he maintained sats in the 80s.  Patient never complained about chest pain.  They noted that he might be febrile today.  Patient himself appears extremely tired on exam and cannot provide me any other history. (For level 3, the HPI must include 4+ descriptors: Location, Quality, Severity, Duration, Timing, Context, modifying factors, associated signs/symptoms and/or status of 3+ chronic problems.)  (Please avoid self-populating past medical history here) (The initial 2-3 lines should be focused and good to copy and paste in the HPI section of the daily progress note).  ED Course: In ED tmax 102.7, BP stable. CXR - NAD, La revealed leukocytosis to 14.3 w/ 87segs/8/lymphs, 5 monos, K 3.9, Cr 2.5, BNP 1,507, Troponin #1 2.25, procalcitonin 2.07. Patient received first dose of rocephin and Azithromycin for possible CAP along with 1 L NS. He is referred to [facility] for admission and management of acute CHF and possible CAP  Review of Systems: As per HPI otherwise 10 point review of systems negative.  Patient denies chest pain. Unacceptable ROS statements: "10 systems reviewed," "Extensive" (without elaboration).  Acceptable ROS statements: "All others negative," "All others reviewed and are negative," and "All others unremarkable," with at [place] documented Can't double dip - if using for HPI can't use for ROS  Past Medical History:  Diagnosis Date  . Arthritis    "all over"  . Atrial fibrillation ([place])   . Benign localized prostatic hyperplasia with lower urinary tract symptoms (LUTS)   . Bradycardia 11/28/2015   Severe-HR in the 20s to 30s following intubation for urological procedure.  Marland Kitchen CAD- non obstructive disease by cath 3/14 cardiologist-  dr hochrein  . Chronic lower back pain   . COPD (chronic obstructive pulmonary disease) ([place])   . DDD (degenerative disc disease)   . Depression   . Dyspnea    w/ exertion and lying down (raise head)  . ETOH abuse   . GERD (gastroesophageal reflux disease)   . Glaucoma   . Glaucoma, both eyes   . History of bladder cancer urologist-- dr Jeffie Pollock   first dx 2012--  s/p TURBT's  . History of DVT of lower extremity 03/2013   bilateral   . History of pulmonary embolus (PE) 04/2013  . HTN (hypertension)   . Hyperlipidemia   . Hypertension   . LBBB (left bundle branch block)    chronic  . Non-ischemic cardiomyopathy ([place])    last echo 01-18-2017, ef 35-40%  . NSVT (nonsustained ventricular tachycardia) ([place])    a.  NSVT 06/2012; NSVT also seen during 03/2013 adm. b. Med rx. Not candidate for ICD given adv age.  Marland Kitchen PAD (peripheral artery disease) ([place])   . 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 PVCs (premature ventricular contractions)   . PVD (peripheral vascular disease) (HCC)    Right ABI .75, Left .78 (2006)  . Syncope 06/2012   a. Felt to be postural syncope related to diuretics 06/2012.  Marland Kitchen Systolic and diastolic CHF, chronic (HCC)  cardiologsit-  dr hochrein   a. NICM - patent cors 06/2012, EF 40% at that time. b. 03/2013 eval: EF 20-25%.  . Vision loss, left eye    due to glaucoma  . Wears glasses     Past Surgical History:  Procedure Laterality Date  . AMPUTATION Left 10/16/2014   Procedure: AMPUTATION ABOVE KNEE- LEFT;  Surgeon: Mal Misty, MD;  Location: [facility];  Service: Vascular;  Laterality: Left;  . CARDIAC CATHETERIZATION  05-11-2004  DR [facility]   MINIMAL CORANARY PLAQUE/ NORMAL LVF/ EF 55%/  NON-OBSTRUCTIVE LAD 25%  . CARDIAC CATHETERIZATION  06/18/2012   dr hochrein   mild luminal irregularities of coronaries, ef 45-50%  . CARDIOVASCULAR STRESS TEST  10-15-2010   LOW RISK NUCLEAR STUDY/ NO EVIDENCE OF ISCHEMIA/ NORMAL EF  . CATARACT EXTRACTION W/ INTRAOCULAR LENS  IMPLANT, BILATERAL Bilateral   . CYSTOSCOPY  10/07/2011   Procedure: CYSTOSCOPY;  Surgeon: Malka So, MD;  [facility];  Service: Urology;  Laterality: N/A;  . CYSTOSCOPY N/A 10/20/2018   Procedure: CYSTOSCOPY WITH FULGURATION OF PROSTATIC URETHRAL TUMOR;  Surgeon: [name], MD;  Location: [facility];  Service: Urology;  Laterality: N/A;  . CYSTOSCOPY WITH BIOPSY  03/14/2012   Procedure: CYSTOSCOPY WITH BIOPSY;  Surgeon: Malka So, MD;  Location: [facility];  Service: Urology;  Laterality: N/A;  WITH FULGURATION  . CYSTOSCOPY WITH BIOPSY N/A 12/09/2016   Procedure: CYSTOSCOPY WITH BIOPSY AND FULGURATION;  Surgeon: [name], MD;  Location: [facility];  Service: Urology;  Laterality: N/A;  . CYSTOSCOPY WITH BIOPSY N/A 12/20/2017   Procedure: CYSTOSCOPY WITH BIOPSY WITH FULGURATION;  Surgeon: [name], MD;  Location: [facility];  Service: Urology;  Laterality: N/A;  . CYSTOSCOPY WITH BIOPSY N/A 06/09/2018   Procedure: CYSTOSCOPY WITH BIOPSY AND FULGURATION;  Surgeon: [name], MD;  Location: [facility];  Service: Urology;  Laterality: N/A;  . CYSTOSCOPY WITH FULGERATION N/A 01/20/2016   Procedure: CYSTOSCOPY, BIOPSY WITH  FULGERATION OF URTHRAL TUMOR;  Surgeon: [name], MD;  [facility];  Service: Urology;  Laterality: N/A;  . CYSTOSCOPY WITH FULGERATION N/A 06/01/2016   Procedure: CYSTOSCOPY WITH FULGERATION urethral tumors and bladder neck;  Surgeon: [name], MD;  Location: [facility];  Service: Urology;  Laterality: N/A;  . SHOULDER SURGERY Left 1970's  . TONSILLECTOMY    . TRANSTHORACIC ECHOCARDIOGRAM  01-18-2017   dr hochrein   moderate LVH, ef 35-40%, diffuse hypokinesis/  trivial AR and MR/  mild TR  . TRANSURETHRAL RESECTION OF BLADDER TUMOR  10/07/2011   Procedure: TRANSURETHRAL RESECTION OF BLADDER TUMOR (TURBT);  Surgeon: Malka So, MD;  Location: [facility];  Service: Urology;  Laterality: N/A;  . TRANSURETHRAL RESECTION OF BLADDER TUMOR N/A 07/10/2015   Procedure: TRANSURETHRAL RESECTION OF BLADDER TUMOR (TURBT), CYSTOSCOPY ;  Surgeon: [name], MD;  Location[facility];  Service: Urology;  Laterality: N/A;  .  TRANSURETHRAL RESECTION OF PROSTATE  03/14/2012   Procedure: TRANSURETHRAL RESECTION OF THE PROSTATE WITH GYRUS INSTRUMENTS;  Surgeon: Malka So, MD;  Location: [facility];  Service: Urology;  Laterality: N/A;   Soc Hx - widowed 2 years ago after a long marriage. Lives at home and has in-home care. Family is very supportive   reports that he quit smoking about 44 years ago. His smoking use included cigarettes. He started smoking about 77 years ago. He has a 35.00 pack-year smoking history. He has never used smokeless tobacco. He reports previous alcohol use of about 7.0 standard drinks of alcohol per week. He reports that he does not use drugs.  Allergies  Allergen Reactions  . Lipitor [Atorvastatin] Other (See Comments)    Unknown rxn per pt    Family History  Problem Relation Age of Onset  . Hypertension Mother   . Arthritis Mother   . Lung cancer Sister   . Deep vein thrombosis Father   . Early death Brother    Unacceptable: Noncontributory, unremarkable, or  negative. Acceptable: Family history reviewed and not pertinent (If you reviewed it)  Prior to Admission medications   Medication Sig Start Date End Date Taking? Authorizing Provider  acetaminophen (TYLENOL) 325 MG tablet Take 2 tablets (650 mg total) by mouth every 6 (six) hours as needed for mild pain (or Fever >/= 101). 12/26/18  Yes Elgergawy, Silver Huguenin, MD  albuterol (PROVENTIL) (2.5 MG/3ML) 0.083% nebulizer solution NEBULIZE 1 VIAL EVERY 6 HOURS AS NEEDED FOR WHEEZING OR SHORTNESS OF BREATH Patient taking differently: Take 2.5 mg by nebulization every 6 (six) hours as needed for wheezing or shortness of breath.  09/07/18  Yes Dettinger, Fransisca Kaufmann, MD  albuterol (VENTOLIN HFA) 108 (90 Base) MCG/ACT inhaler Inhale 2 puffs into the lungs every 6 (six) hours as needed for wheezing or shortness of breath. 05/17/19  Yes Dettinger, Fransisca Kaufmann, MD  aspirin EC 81 MG EC tablet Take 1 tablet (81 mg total) by mouth daily. 12/12/18  Yes Tat, [name], MD  BIDIL 20-37.5 MG tablet TAKE 1 TABLET BY MOUTH 3 TIMES DAILY. 05/30/19  Yes Dettinger, Fransisca Kaufmann, MD  carvedilol (COREG) 6.25 MG tablet TAKE  (1)  TABLET TWICE A DAY. Patient taking differently: Take 6.25 mg by mouth 2 (two) times daily with a meal.  06/22/19  Yes Dettinger, Fransisca Kaufmann, MD  COMBIVENT RESPIMAT 20-100 MCG/ACT AERS respimat 1 PUFF EVERY 4 HOURS AS NEEDED FOR COUGH, WHEEZING, OR SHORTNESS OF BREATH 06/28/19  Yes Dettinger, Fransisca Kaufmann, MD  donepezil (ARICEPT) 10 MG tablet TAKE ONE TABLET AT BEDTIME 04/26/19  Yes Dettinger, Fransisca Kaufmann, MD  febuxostat (ULORIC) 40 MG tablet TAKE 1 TABLET DAILY 04/26/19  Yes Dettinger, Fransisca Kaufmann, MD  fluticasone (FLONASE) 50 MCG/ACT nasal spray Place 1 spray into both nostrils daily as needed for allergies or rhinitis. Patient taking differently: Place 1 spray into both nostrils at bedtime.  09/07/18  Yes Dettinger, Fransisca Kaufmann, MD  folic acid (FOLVITE) 1 MG tablet TAKE 1 TABLET DAILY 05/24/19  Yes Dettinger, Fransisca Kaufmann, MD  furosemide (LASIX)  40 MG tablet TAKE 1 TABLET 2 TIMES A DAY 06/28/19  Yes Dettinger, Fransisca Kaufmann, MD  gabapentin (NEURONTIN) 100 MG capsule TAKE 2 CAPSULES TWICE A DAY 05/30/19  Yes Dettinger, Fransisca Kaufmann, MD  HYDROcodone-acetaminophen (NORCO/VICODIN) 5-325 MG tablet Take 1 tablet by mouth 2 (two) times daily as needed for moderate pain. 01/30/19  Yes Dettinger, Fransisca Kaufmann, MD  ipratropium (ATROVENT) 0.02 % nebulizer solution  Take 0.5 mg by nebulization every 6 (six) hours as needed for wheezing or shortness of breath.   Yes [provider]  MYRBETRIQ 25 MG TB24 tablet Take 25 mg by mouth every morning.  09/20/16  Yes [provider]  pantoprazole (PROTONIX) 40 MG tablet TAKE 1 TABLET DAILY 05/16/19  Yes Dettinger, Fransisca Kaufmann, MD  sertraline (ZOLOFT) 50 MG tablet TAKE 1 TABLET IN THE MORNING 05/24/19  Yes Dettinger, Fransisca Kaufmann, MD  simvastatin (ZOCOR) 10 MG tablet TAKE 1 TABLET ONCE DAILY IN THE EVENING Patient taking differently: Take 10 mg by mouth daily at 6 PM.  05/15/19  Yes Dettinger, Fransisca Kaufmann, MD  warfarin (COUMADIN) 2.5 MG tablet Take 2.5 mg by mouth daily except Mondays and Fridays take 5 mg 05/24/19  Yes Dettinger, Fransisca Kaufmann, MD    Physical Exam: Vitals:   07/01/19 1600 07/01/19 1630 07/01/19 1730 07/01/19 1800  BP: 111/70 120/75 132/65 134/63  Pulse: (!) 37 98 (!) 48 (!) 45  Resp: (!) 27 (!) 28 (!) 31 (!) 35  Temp:      TempSrc:      SpO2: 96% 93% 97% 97%  Weight:      Height:        Constitutional: NAD, calm, comfortable Vitals:   07/01/19 1600 07/01/19 1630 07/01/19 1730 07/01/19 1800  BP: 111/70 120/75 132/65 134/63  Pulse: (!) 37 98 (!) 48 (!) 45  Resp: (!) 27 (!) 28 (!) 31 (!) 35  Temp:      TempSrc:      SpO2: 96% 93% 97% 97%  Weight:      Height:       General: wizened elderly man who is visibly SOB Eyes: PERRL, lids and conjunctivae normal ENMT: Mucous membranes are dry. Posterior pharynx clear of any exudate or lesions.Poor dentition missing most of his teeth.  Neck: normal,  supple, no masses, no thyromegaly Respiratory: Moderate increased WOB, use of abdominal musculature, no neck retractions. No nasal flare. Bibasilar rales on auscultation, no wheezing.  Cardiovascular: Regular rate and rhythm, no murmurs / rubs / gallops. No extremity edema. 2+ radial and right pedal pulses. No carotid bruits.  Abdomen: no tenderness, no masses palpated. No hepatosplenomegaly. Bowel sounds positive.  Musculoskeletal: enlarged PIP joints several fingers, enlarged radial stylus bilaterally. No hot joints, no tophi. . Good ROM, no contractures. Normal muscle tone.  Skin: no rashes, lesions, ulcers. No induration Neurologic: CN 2-12 grossly intact.  Psychiatric: Normal judgment and insight. Awake and oriented x 3.    (Anything < 9 systems with 2 bullets each down codes to level 1) (If patient refuses exam can't bill higher level) (Make sure to document decubitus ulcers present on admission -- if possible -- and whether patient has chronic indwelling catheter at time of admission)  Labs on Admission: I have personally reviewed following labs and imaging studies  CBC: Recent Labs  Lab 07/01/19 1452  WBC 14.3*  NEUTROABS 12.3*  HGB 13.7  HCT 43.5  MCV 87.7  PLT 0000000   Basic Metabolic Panel: Recent Labs  Lab 07/01/19 1452  NA 141  K 3.9  CL 103  CO2 27  GLUCOSE 101*  BUN 55*  CREATININE 2.50*  CALCIUM 9.5   GFR: Estimated Creatinine Clearance: 15.4 mL/min (A) (by C-G formula based on SCr of 2.5 mg/dL (H)). Liver Function Tests: Recent Labs  Lab 07/01/19 1452  AST 24  ALT 16  ALKPHOS 157*  BILITOT 0.9  PROT 8.0  ALBUMIN 4.5  No results for input(s): LIPASE, AMYLASE in the last 168 hours. No results for input(s): AMMONIA in the last 168 hours. Coagulation Profile: Recent Labs  Lab 07/01/19 1452  INR 2.0*   Cardiac Enzymes: No results for input(s): CKTOTAL, CKMB, CKMBINDEX, TROPONINI in the last 168 hours. BNP (last 3 results) No results for  input(s): PROBNP in the last 8760 hours. HbA1C: No results for input(s): HGBA1C in the last 72 hours. CBG: No results for input(s): GLUCAP in the last 168 hours. Lipid Profile: No results for input(s): CHOL, HDL, LDLCALC, TRIG, CHOLHDL, LDLDIRECT in the last 72 hours. Thyroid Function Tests: No results for input(s): TSH, T4TOTAL, FREET4, T3FREE, THYROIDAB in the last 72 hours. Anemia Panel: No results for input(s): VITAMINB12, FOLATE, FERRITIN, TIBC, IRON, RETICCTPCT in the last 72 hours. Urine analysis:    Component Value Date/Time   COLORURINE YELLOW 12/23/2018 1001   APPEARANCEUR Hazy (A) 02/02/2019 1149   LABSPEC 1.013 12/23/2018 1001   PHURINE 5.0 12/23/2018 1001   GLUCOSEU Negative 02/02/2019 1149   HGBUR LARGE (A) 12/23/2018 1001   BILIRUBINUR Negative 02/02/2019 1149   KETONESUR NEGATIVE 12/23/2018 1001   PROTEINUR Negative 02/02/2019 1149   PROTEINUR 100 (A) 12/23/2018 1001   UROBILINOGEN negative 05/19/2015 1203   UROBILINOGEN 1.0 10/22/2014 1804   NITRITE Positive (A) 02/02/2019 1149   NITRITE NEGATIVE 12/23/2018 1001   LEUKOCYTESUR Negative 02/02/2019 1149   LEUKOCYTESUR TRACE (A) 12/23/2018 1001    Radiological Exams on Admission: DG Chest Port 1 View  Result Date: 07/01/2019 CLINICAL DATA:  Shortness of breath. EXAM: PORTABLE CHEST 1 VIEW COMPARISON:  December 26, 2018. FINDINGS: Stable cardiomediastinal silhouette. No pneumothorax or pleural effusion is noted. Both lungs are clear. The visualized skeletal structures are unremarkable. IMPRESSION: No active disease. Electronically Signed   By: Marijo Conception M.D.   On: 07/01/2019 15:15    EKG: Independently reviewed. ST.   Assessment/Plan Active Problems:   Chronic combined systolic and diastolic heart failure (HCC)   CAP (community acquired pneumonia)   NICM (nonischemic cardiomyopathy)- EF 20-25% by Echo 04/12/13   Essential hypertension   Chronic kidney disease (CKD) stage G3a/A1, moderately decreased  glomerular filtration rate (GFR) between 45-59 mL/min/1.73 square meter and albuminuria creatinine ratio less than 30 mg/g (HCC)   Paroxysmal atrial fibrillation (HCC)   Benign localized hyperplasia of prostate with urinary obstruction   Acute combined systolic (congestive) and diastolic (congestive) heart failure ([place])  (please populate well all problems here in Problem List. (For example, if patient is on BP meds at home and you resume or decide to hold them, it is a problem that needs to be her. Same for CAD, COPD, HLD and so on)   1. Cardiology - patient with acute on chronic combined CHF w/ elevated BNP, marked SOB. Modest elevation in Tropinin most c/w stress related ischemia with no indication of acute MI. Patient is DNR/DNI and verly elderly Plan Med-surg admit  IV furosemide 40 mg q8  Continue home meds otherwise-including B-blocker, Bidil.  2 D echo (last study Aug 2020 with EF 35-40% and impaired relaxation)  For poor response to therapy may need cardiology consult  2. CAP - NAD on CXR, but leukocytosis (demargination due to #1?), mildly elevated procalcitonin, a fever spike. Plan Continue Rocephin and Azithromycin  3. CKD III -  Baseline approx Cr 1.5. He has been on fluid restriction. Now with Cr 2.55 Plan With #1 will not give additional fluids  F/u Bmet  4. PAF - in  sinus rhythm at admission. Plan Continue home meds  Continue warfarin - pharmacy to consult  5. BPH - continue home meds  6. Code status - confirmed DNR/DNI status with patient and son  DVT prophylaxis: warfarin (Lovenox/Heparin/SCD's/anticoagulated/None (if comfort care) Code Status: DNR (Full/Partial (specify details) Family Communication: Son was on speaker phone during exam. Answered all questions. He is aware of poor prognosis and high risk of patient decline (Specify name, relationship. Do not write "discussed with patient". Specify tel # if discussed over the phone) Disposition Plan: home when  medically stable (specify when and where you expect patient to be discharged) Consults called: none (with names) Admission status: inpatient (inpatient / obs / tele / medical floor / SDU)   Adella Hare MD [facility] Pager [phone]  If 7PM-7AM, please contact night-coverage [email or web address] Password [facility]  07/01/2019, 6:20 PM

## 2019-07-01 NOTE — ED Provider Notes (Signed)
[facility] EMERGENCY DEPARTMENT Provider Note   CSN: [id number] Arrival date & time: 07/01/19  1340     History Chief Complaint  Patient presents with  . Altered Mental Status    Scott [name] is a 86 y.o. male who is DNR/DNI, hx of a fib on coumadin, CAD s/p MI 2019, left BKA, diabetes, presented to the hospital with fever, lethargy, and shortness of breath.  His caretaker at the bedside reports that for the past 3 days or so the patient has been complaining about shortness of breath.  They have noted that his home O2 sat has been dropping as low as 72% on room air.  Normally the patient is 93 to 94% on room air.  They feel like he was improving with albuterol treatments at home, but today he maintained sats in the 80s.  Patient never complained about chest pain.  They noted that he might be febrile today.  Patient himself appears extremely tired on exam and cannot provide me any other history.  They report that approximately 2 years ago the patient was seen in our hospital and suffered an MI, was not felt to be a candidate for the Cath Lab at that time.  He has a hx of BPH  His caretaker who is his nephew as well as on the phone confirm that the patient is DNR/DNI.  HPI     Past Medical History:  Diagnosis Date  . Arthritis    "all over"  . Atrial fibrillation ([place])   . Benign localized prostatic hyperplasia with lower urinary tract symptoms (LUTS)   . Bradycardia 11/28/2015   Severe-HR in the 20s to 30s following intubation for urological procedure.  Marland Kitchen CAD- non obstructive disease by cath 3/14 cardiologist-  dr hochrein  . Chronic lower back pain   . COPD (chronic obstructive pulmonary disease) ([place])   . DDD (degenerative disc disease)   . Depression   . Dyspnea    w/ exertion and lying down (raise head)  . ETOH abuse   . GERD (gastroesophageal reflux disease)   . Glaucoma   . Glaucoma, both eyes   . History of bladder cancer urologist-- dr Jeffie Pollock   first dx 2012--   s/p TURBT's  . History of DVT of lower extremity 03/2013   bilateral   . History of pulmonary embolus (PE) 04/2013  . HTN (hypertension)   . Hyperlipidemia   . Hypertension   . LBBB (left bundle branch block)    chronic  . Non-ischemic cardiomyopathy ([place])    last echo 01-18-2017, ef 35-40%  . NSVT (nonsustained ventricular tachycardia) ([employer])    a. NSVT 06/2012; NSVT also seen during 03/2013 adm. b. Med rx. Not candidate for ICD given adv age.  Marland Kitchen PAD (peripheral artery disease) ([place])   . 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 PVCs (premature ventricular contractions)   . PVD (peripheral vascular disease) (HCC)    Right ABI .75, Left .78 (2006)  . Syncope 06/2012   a. Felt to be postural syncope related to diuretics 06/2012.  Marland Kitchen Systolic and diastolic CHF, chronic (HCC) cardiologsit-  dr hochrein   a. NICM - patent cors 06/2012, EF 40% at that time. b. 03/2013 eval: EF 20-25%.  . Vision loss, left eye    due to glaucoma  . Wears glasses     Patient Active Problem List   Diagnosis Date Noted  .  Foot pain 12/21/2018  . Cellulitis 12/21/2018  . CKD (chronic kidney disease) stage 3, GFR 30-59 ml/min 12/11/2018  . Dyspnea 12/10/2018  . Alzheimer disease ([place]) 09/07/2018  . SIRS (systemic inflammatory response syndrome) ([place]) 06/22/2018  . Paroxysmal atrial fibrillation ([place]) 01/18/2017  . PVC (pulmonary venous congestion) 01/18/2017  . Thoracic aortic atherosclerosis ([place]) 11/26/2016  . Thrombocytopenia ([place]) 08/04/2016  . Chronic kidney disease (CKD) stage G3a/A1, moderately decreased glomerular filtration rate (GFR) between 45-59 mL/min/1.73 square meter and albuminuria creatinine ratio less than 30 mg/g (HCC) 08/04/2016  . Essential hypertension 11/29/2015  . Bradycardia 11/28/2015  . Malignant neoplasm of lateral wall of urinary bladder ([place]) 07/10/2015  . Status post above-knee  amputation of left lower extremity ([place]) 10/18/2014  . Tachyarrhythmia 10/05/2014  . Aspiration pneumonia ([place]) 10/05/2014  . Sepsis due to other etiology ([place]) 10/03/2014  . Atherosclerotic PVD with ulceration ([place]) 10/03/2014  . Atrial fibrillation with RVR ([place]) 10/03/2014  . Hyperkalemia 10/03/2014  . PAD (peripheral artery disease) ([place]) 09/24/2014  . NSTEMI (non-ST elevated myocardial infarction) ([place]) 10/09/2013  . High risk medication use 05/17/2013  . BPH (benign prostatic hyperplasia) 05/17/2013  . Vitamin D deficiency 05/17/2013  . History of pulmonary embolism (2014) 04/17/2013  . NSVT (nonsustained ventricular tachycardia)- not an ICD candidate 04/17/2013  . NICM (nonischemic cardiomyopathy)- EF 20-25% by Echo 04/12/13 04/17/2013  . Chronic combined systolic and diastolic heart failure ([place]) 03/15/2013  . DVT of lower extremity, bilateral ([place]) 03/15/2013  . Constipation 11/02/2012  . Syncope 06/21/2012  . Bladder cancer ([place]) 03/15/2012  . Benign localized hyperplasia of prostate with urinary obstruction 03/15/2012  . Hyperlipidemia   . PVCs (premature ventricular contractions)   . CAD- non obstructive disease by cath 3/14     Past Surgical History:  Procedure Laterality Date  . AMPUTATION Left 10/16/2014   Procedure: AMPUTATION ABOVE KNEE- LEFT;  Surgeon: Mal Misty, MD;  Location: [place];  Service: Vascular;  Laterality: Left;  . CARDIAC CATHETERIZATION  05-11-2004  DR [facility]   MINIMAL CORANARY PLAQUE/ NORMAL LVF/ EF 55%/  NON-OBSTRUCTIVE LAD 25%  . CARDIAC CATHETERIZATION  06/18/2012   dr hochrein   mild luminal irregularities of coronaries, ef 45-50%  . CARDIOVASCULAR STRESS TEST  10-15-2010   LOW RISK NUCLEAR STUDY/ NO EVIDENCE OF ISCHEMIA/ NORMAL EF  . CATARACT EXTRACTION W/ INTRAOCULAR LENS  IMPLANT, BILATERAL Bilateral   . CYSTOSCOPY  10/07/2011   Procedure: CYSTOSCOPY;  Surgeon: Malka So, MD;  Location: [facility];  Service: Urology;   Laterality: N/A;  . CYSTOSCOPY N/A 10/20/2018   Procedure: CYSTOSCOPY WITH FULGURATION OF PROSTATIC URETHRAL TUMOR;  Surgeon: [name], MD;  Location: [facility];  Service: Urology;  Laterality: N/A;  . CYSTOSCOPY WITH BIOPSY  03/14/2012   Procedure: CYSTOSCOPY WITH BIOPSY;  Surgeon: Malka So, MD;  Location: [facility];  Service: Urology;  Laterality: N/A;  WITH FULGURATION  . CYSTOSCOPY WITH BIOPSY N/A 12/09/2016   Procedure: CYSTOSCOPY WITH BIOPSY AND FULGURATION;  Surgeon: [name], MD;  Location: [facility];  Service: Urology;  Laterality: N/A;  . CYSTOSCOPY WITH BIOPSY N/A 12/20/2017   Procedure: CYSTOSCOPY WITH BIOPSY WITH FULGURATION;  Surgeon: [name], MD;  Location: [facility];  Service: Urology;  Laterality: N/A;  . CYSTOSCOPY WITH BIOPSY N/A 06/09/2018   Procedure: CYSTOSCOPY WITH BIOPSY AND FULGURATION;  Surgeon: [name], MD;  Location: [facility];  Service: Urology;  Laterality: N/A;  . CYSTOSCOPY WITH FULGERATION N/A 01/20/2016   Procedure: CYSTOSCOPY,  BIOPSY WITH FULGERATION OF Theodis Shove TUMOR;  Surgeon: [name], MD;  Location: [facility];  Service: Urology;  Laterality: N/A;  . CYSTOSCOPY WITH FULGERATION N/A 06/01/2016   Procedure: CYSTOSCOPY WITH FULGERATION urethral tumors and bladder neck;  Surgeon: [name], MD;  Location: [facility];  Service: Urology;  Laterality: N/A;  . SHOULDER SURGERY Left 1970's  . TONSILLECTOMY    . TRANSTHORACIC ECHOCARDIOGRAM  01-18-2017   dr hochrein   moderate LVH, ef 35-40%, diffuse hypokinesis/  trivial AR and MR/  mild TR  . TRANSURETHRAL RESECTION OF BLADDER TUMOR  10/07/2011   Procedure: TRANSURETHRAL RESECTION OF BLADDER TUMOR (TURBT);  Surgeon: Malka So, MD;  Location: [facility];  Service: Urology;  Laterality: N/A;  . TRANSURETHRAL RESECTION OF BLADDER TUMOR N/A 07/10/2015   Procedure: TRANSURETHRAL RESECTION OF BLADDER TUMOR (TURBT), CYSTOSCOPY ;  Surgeon: [name], MD;  Location[facility];  Service: Urology;  Laterality: N/A;  .  TRANSURETHRAL RESECTION OF PROSTATE  03/14/2012   Procedure: TRANSURETHRAL RESECTION OF THE PROSTATE WITH GYRUS INSTRUMENTS;  Surgeon: Malka So, MD;  Location[facility];  Service: Urology;  Laterality: N/A;       Family History  Problem Relation Age of Onset  . Hypertension Mother   . Arthritis Mother   . Lung cancer Sister   . Deep vein thrombosis Father   . Early death Brother     Social History   Tobacco Use  . Smoking status: Former Smoker    Packs/day: 1.00    Years: 35.00    Pack years: 35.00    Types: Cigarettes    Start date: 04/12/1942    Quit date: 04/13/1975    Years since quitting: 44.2  . Smokeless tobacco: Never Used  Substance Use Topics  . Alcohol use: Not Currently    Alcohol/week: 7.0 standard drinks    Types: 7 Cans of beer per week  . Drug use: No    Home Medications Prior to Admission medications   Medication Sig Start Date End Date Taking? Authorizing Provider  acetaminophen (TYLENOL) 325 MG tablet Take 2 tablets (650 mg total) by mouth every 6 (six) hours as needed for mild pain (or Fever >/= 101). 12/26/18   Elgergawy, Silver Huguenin, MD  albuterol (PROVENTIL) (2.5 MG/3ML) 0.083% nebulizer solution NEBULIZE 1 VIAL EVERY 6 HOURS AS NEEDED FOR WHEEZING OR SHORTNESS OF BREATH Patient taking differently: Take 2.5 mg by nebulization every 6 (six) hours as needed for wheezing or shortness of breath.  09/07/18   Dettinger, Fransisca Kaufmann, MD  albuterol (VENTOLIN HFA) 108 (90 Base) MCG/ACT inhaler Inhale 2 puffs into the lungs every 6 (six) hours as needed for wheezing or shortness of breath. 05/17/19   Dettinger, Fransisca Kaufmann, MD  aspirin EC 81 MG EC tablet Take 1 tablet (81 mg total) by mouth daily. 12/12/18   Orson Eva, MD  BIDIL 20-37.5 MG tablet TAKE 1 TABLET BY MOUTH 3 TIMES DAILY. 05/30/19   Dettinger, Fransisca Kaufmann, MD  carvedilol (COREG) 6.25 MG tablet TAKE  (1)  TABLET TWICE A DAY. 06/22/19   Dettinger, Fransisca Kaufmann, MD  COMBIVENT RESPIMAT 20-100 MCG/ACT AERS respimat 1 PUFF EVERY  4 HOURS AS NEEDED FOR COUGH, WHEEZING, OR SHORTNESS OF BREATH 06/28/19   Dettinger, Fransisca Kaufmann, MD  donepezil (ARICEPT) 10 MG tablet TAKE ONE TABLET AT BEDTIME 04/26/19   Dettinger, Fransisca Kaufmann, MD  febuxostat (ULORIC) 40 MG tablet TAKE 1 TABLET DAILY 04/26/19   Dettinger, Fransisca Kaufmann, MD  fluticasone (FLONASE) 50 MCG/ACT nasal spray Place 1 spray into both nostrils daily as needed for allergies or rhinitis. Patient taking differently: Place 1 spray into both nostrils at bedtime.  09/07/18   Dettinger, Fransisca Kaufmann, MD  folic acid (FOLVITE) 1 MG tablet TAKE 1 TABLET DAILY 05/24/19   Dettinger, Fransisca Kaufmann, MD  furosemide (LASIX) 40 MG tablet TAKE 1 TABLET 2 TIMES A DAY 06/28/19   Dettinger, Fransisca Kaufmann, MD  gabapentin (NEURONTIN) 100 MG capsule TAKE 2 CAPSULES TWICE A DAY 05/30/19   Dettinger, Fransisca Kaufmann, MD  HYDROcodone-acetaminophen (NORCO/VICODIN) 5-325 MG tablet Take 1 tablet by mouth 2 (two) times daily as needed for moderate pain. 01/30/19   Dettinger, Fransisca Kaufmann, MD  ipratropium (ATROVENT) 0.02 % nebulizer solution Take 0.5 mg by nebulization every 6 (six) hours as needed for wheezing or shortness of breath.    [provider]  MYRBETRIQ 25 MG TB24 tablet Take 25 mg by mouth every morning.  09/20/16   [provider]  pantoprazole (PROTONIX) 40 MG tablet TAKE 1 TABLET DAILY 05/16/19   Dettinger, Fransisca Kaufmann, MD  sertraline (ZOLOFT) 50 MG tablet TAKE 1 TABLET IN THE MORNING 05/24/19   Dettinger, Fransisca Kaufmann, MD  simvastatin (ZOCOR) 10 MG tablet TAKE 1 TABLET ONCE DAILY IN THE EVENING 05/15/19   Dettinger, Fransisca Kaufmann, MD  warfarin (COUMADIN) 2.5 MG tablet Take 2.5 mg by mouth daily except Mondays and Fridays take 5 mg 05/24/19   Dettinger, Fransisca Kaufmann, MD    Allergies    Lipitor [atorvastatin]  Review of Systems   Review of Systems  Unable to perform ROS: Mental status change (level 5 caveat)    Physical Exam Updated Vital Signs BP 111/70   Pulse (!) 37   Temp (!) 101.7 F (38.7 C) (Oral) Comment: Simultaneous  filing. User may not have seen previous data. Comment (Src): Simultaneous filing. User may not have seen previous data.  Resp (!) 27   Ht 5\' 5"  (1.651 m)   Wt 66.2 kg   SpO2 96%   BMI 24.30 kg/m   Physical Exam Vitals and nursing note reviewed.  Constitutional:      Appearance: He is well-developed.     Comments: Somnolent but arousable  HENT:     Head: Normocephalic and atraumatic.     Comments: Tachy mucous membranes Eyes:     Conjunctiva/sclera: Conjunctivae normal.  Cardiovascular:     Rate and Rhythm: Normal rate and regular rhythm.     Comments: HR 90-100 bpm with frequent PVC's on telemetry Pulmonary:     Effort: Pulmonary effort is normal. No respiratory distress.     Comments: No wheezing Satting 95% on 3L [place] Abdominal:     Palpations: Abdomen is soft.     Tenderness: There is no abdominal tenderness.  Musculoskeletal:     Cervical back: Neck supple.     Comments: Left BKA No edema of the right lower extremity  Skin:    General: Skin is warm and dry.  Neurological:     Comments: Follows commands Confused on question response     ED Results / Procedures / Treatments   Labs (all labs ordered are listed, but only abnormal results are displayed) Labs Reviewed  COMPREHENSIVE METABOLIC PANEL - Abnormal; Notable for the following components:      Result Value   Glucose, Bld 101 (*)    BUN 55 (*)    Creatinine, Ser 2.50 (*)    Alkaline Phosphatase 157 (*)  GFR calc non Af Amer 21 (*)    GFR calc Af Amer 24 (*)    All other components within normal limits  CBC WITH DIFFERENTIAL/PLATELET - Abnormal; Notable for the following components:   WBC 14.3 (*)    Neutro Abs 12.3 (*)    All other components within normal limits  APTT - Abnormal; Notable for the following components:   aPTT 39 (*)    All other components within normal limits  PROTIME-INR - Abnormal; Notable for the following components:   Prothrombin Time 22.4 (*)    INR 2.0 (*)    All other  components within normal limits  BRAIN NATRIURETIC PEPTIDE - Abnormal; Notable for the following components:   B Natriuretic Peptide 1,507.0 (*)    All other components within normal limits  CULTURE, BLOOD (ROUTINE X 2)  CULTURE, BLOOD (ROUTINE X 2)  URINE CULTURE  LACTIC ACID, PLASMA  LACTIC ACID, PLASMA  URINALYSIS, ROUTINE W REFLEX MICROSCOPIC  PROCALCITONIN  POC SARS CORONAVIRUS 2 AG -  ED  TROPONIN I (HIGH SENSITIVITY)    EKG None  Radiology DG Chest Port 1 View  Result Date: 07/01/2019 CLINICAL DATA:  Shortness of breath. EXAM: PORTABLE CHEST 1 VIEW COMPARISON:  December 26, 2018. FINDINGS: Stable cardiomediastinal silhouette. No pneumothorax or pleural effusion is noted. Both lungs are clear. The visualized skeletal structures are unremarkable. IMPRESSION: No active disease. Electronically Signed   By: Marijo Conception M.D.   On: 07/01/2019 15:15    Procedures .Critical Care Performed by: Wyvonnia Dusky, MD Authorized by: Wyvonnia Dusky, MD   Critical care provider statement:    Critical care time (minutes):  43   Critical care was necessary to treat or prevent imminent or life-threatening deterioration of the following conditions:  Sepsis   Critical care was time spent personally by me on the following activities:  Discussions with consultants, evaluation of patient's response to treatment, examination of patient, ordering and performing treatments and interventions, ordering and review of laboratory studies, ordering and review of radiographic studies, pulse oximetry, re-evaluation of patient's condition, obtaining history from patient or surrogate and review of old charts Comments:     IV antibiotics and IV fluids   (including critical care time)  Medications Ordered in ED Medications  azithromycin (ZITHROMAX) 500 mg in sodium chloride 0.9 % 250 mL IVPB (500 mg Intravenous New Bag/Given 07/01/19 1556)  sodium chloride 0.9 % bolus 1,000 mL (1,000 mLs Intravenous  New Bag/Given 07/01/19 1518)  cefTRIAXone (ROCEPHIN) 1 g in sodium chloride 0.9 % 100 mL IVPB (1 g Intravenous New Bag/Given 07/01/19 1519)    ED Course  I have reviewed the triage vital signs and the nursing notes.  Pertinent labs & imaging results that were available during my care of the patient were reviewed by me and considered in my medical decision making (see chart for details).  86 year old male with multiple comorbidities, including DVT PE on Coumadin, NSTEMI and coronary artery disease, congestive heart failure, presenting to the emergency department with fever, hypoxia, and confusion.  Presentation is concerning for pneumonia and possible sepsis.  His rapid Covid was negative and he had both Covid vaccines done as an outpatient over a month ago.  Initiated a sepsis work-up was started with IV ceftriaxone and azithromycin.  There is an infiltrate on his x-ray.  He has got a white blood cell count of 14.2.  His BNP is elevated here but near his baseline.  His last echo was 12/11/18  showing EF 35-40% (improved from the year before) and moderately reduced right ventricular systolic function.  He had akineses of apical anteroseptal wall LV and basal-mid inferior wall LV.  According to his family, he had an MI a year or 2 ago, was not felt to be a candidate for the Cath Lab at that time.  I suspect this and the same will be the case today if he has developed demand ischemia, and given that he is not a candidate for cardiac intervention, he can be managed at our facility.  Heart rate is erratic with some ectopy, multiple PVCs.  Generally in high 90's, not 37 or 47 as noted on automatic readings.  He is DNR/DNI.  Clinical Course as of Jun 30 1617  Sun Jul 01, 2019  1545 BNP chronically elevated, this is close to his baseline   [MT]    Clinical Course User Index [MT] Amariana Mirando, Carola Rhine, MD    Final Clinical Impression(s) / ED Diagnoses Final diagnoses:  Sepsis, due to unspecified  organism, unspecified whether acute organ dysfunction present [facility])  Community acquired pneumonia, unspecified laterality  Hypoxia    Rx / DC Orders ED Discharge Orders    None       Wyvonnia Dusky, MD 07/01/19 1620

## 2019-07-01 NOTE — ED Notes (Signed)
ED TO INPATIENT HANDOFF REPORT  ED Nurse Name and Phone #: [phone]  S Name/Age/Gender Scott [name] 86 y.o. male Room/Bed: APA02/APA02  Code Status   Code Status: DNR  Home/SNF/Other Home Patient oriented to: self and place Is this baseline? Yes   Triage Complete: Triage complete  Chief Complaint Acute combined systolic (congestive) and diastolic (congestive) heart failure ([place]) [I50.41]  Triage Note Patient was noted to be lethargic this morning and unable to help dress himself, which is new. Per family/caretaker patient c/o shortness of breath and had O2 sat of 68% last night in which they gave him breathing treatment and O2 sats were 94%. Patient normal O2 sats are 92%. Patient's sat this morning 89% and another breathing treatment given. Patient alert to person and place. Patient is notable lethargic. Family states patient had similar episode approx 1year ago and had elevated BNP. Patient does have some swelling to right lower leg, nonpitting. 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  Patient does have dark colored urine with some odor. Denies any urinary complaints from patient. Denies any fevers.    Allergies Allergies  Allergen Reactions  . Lipitor [Atorvastatin] Other (See Comments)    Unknown rxn per pt    Level of Care/Admitting Diagnosis ED Disposition    ED Disposition Condition [place]: [facility] [[id number]]  Level of Care: Med-Surg [16]  Covid Evaluation: Confirmed COVID Negative  Date Laboratory Confirmed COVID Negative: 07/01/2019  Diagnosis: Acute combined systolic (congestive) and diastolic (congestive) heart failure [facility]) [[id number]]  Admitting Physician: Neena Rhymes [[id number]]  Attending Physician: Adella Hare E [[id number]]  Estimated length of stay: past midnight tomorrow  Certification:: I certify this patient will need inpatient services for at least 2 midnights       B Medical/Surgery History Past Medical  History:  Diagnosis Date  . Arthritis    "all over"  . Atrial fibrillation ([place])   . Benign localized prostatic hyperplasia with lower urinary tract symptoms (LUTS)   . Bradycardia 11/28/2015   Severe-HR in the 20s to 30s following intubation for urological procedure.  Marland Kitchen CAD- non obstructive disease by cath 3/14 cardiologist-  dr hochrein  . Chronic lower back pain   . COPD (chronic obstructive pulmonary disease) ([place])   . DDD (degenerative disc disease)   . Depression   . Dyspnea    w/ exertion and lying down (raise head)  . ETOH abuse   . GERD (gastroesophageal reflux disease)   . Glaucoma   . Glaucoma, both eyes   . History of bladder cancer urologist-- dr Jeffie Pollock   first dx 2012--  s/p TURBT's  . History of DVT of lower extremity 03/2013   bilateral   . History of pulmonary embolus (PE) 04/2013  . HTN (hypertension)   . Hyperlipidemia   . Hypertension   . LBBB (left bundle branch block)    chronic  . Non-ischemic cardiomyopathy ([place])    last echo 01-18-2017, ef 35-40%  . NSVT (nonsustained ventricular tachycardia) ([place])    a. NSVT 06/2012; NSVT also seen during 03/2013 adm. b. Med rx. Not candidate for ICD given adv age.  Marland Kitchen PAD (peripheral artery disease) ([place])   . 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 PVCs (premature ventricular contractions)   . PVD (peripheral vascular disease) (HCC)    Right ABI .75, Left .78 (2006)  . Syncope  06/2012   a. Felt to be postural syncope related to diuretics 06/2012.  Marland Kitchen Systolic and diastolic CHF, chronic (HCC) cardiologsit-  dr hochrein   a. NICM - patent cors 06/2012, EF 40% at that time. b. 03/2013 eval: EF 20-25%.  . Vision loss, left eye    due to glaucoma  . Wears glasses    Past Surgical History:  Procedure Laterality Date  . AMPUTATION Left 10/16/2014   Procedure: AMPUTATION ABOVE KNEE- LEFT;  Surgeon: Mal Misty, MD;  Location: [facility];  Service: Vascular;  Laterality: Left;  . CARDIAC CATHETERIZATION  05-11-2004  DR [facility]   MINIMAL CORANARY PLAQUE/ NORMAL LVF/ EF 55%/  NON-OBSTRUCTIVE LAD 25%  . CARDIAC CATHETERIZATION  06/18/2012   dr hochrein   mild luminal irregularities of coronaries, ef 45-50%  . CARDIOVASCULAR STRESS TEST  10-15-2010   LOW RISK NUCLEAR STUDY/ NO EVIDENCE OF ISCHEMIA/ NORMAL EF  . CATARACT EXTRACTION W/ INTRAOCULAR LENS  IMPLANT, BILATERAL Bilateral   . CYSTOSCOPY  10/07/2011   Procedure: CYSTOSCOPY;  Surgeon: Malka So, MD;  Location: [facility];  Service: Urology;  Laterality: N/A;  . CYSTOSCOPY N/A 10/20/2018   Procedure: CYSTOSCOPY WITH FULGURATION OF PROSTATIC URETHRAL TUMOR;  Surgeon: [name], MD;  [facility];  Service: Urology;  Laterality: N/A;  . CYSTOSCOPY WITH BIOPSY  03/14/2012   Procedure: CYSTOSCOPY WITH BIOPSY;  Surgeon: Malka So, MD;  Location: [facility];  Service: Urology;  Laterality: N/A;  WITH FULGURATION  . CYSTOSCOPY WITH BIOPSY N/A 12/09/2016   Procedure: CYSTOSCOPY WITH BIOPSY AND FULGURATION;  Surgeon: [name], MD;  [facility];  Service: Urology;  Laterality: N/A;  . CYSTOSCOPY WITH BIOPSY N/A 12/20/2017   Procedure: CYSTOSCOPY WITH BIOPSY WITH FULGURATION;  Surgeon: [name], MD;  [facility];  Service: Urology;  Laterality: N/A;  . CYSTOSCOPY WITH BIOPSY N/A 06/09/2018   Procedure: CYSTOSCOPY WITH BIOPSY AND FULGURATION;  Surgeon: [name], MD;  [facility];  Service: Urology;  Laterality: N/A;  . CYSTOSCOPY WITH FULGERATION N/A 01/20/2016   Procedure: CYSTOSCOPY, BIOPSY WITH FULGERATION OF URTHRAL TUMOR;  Surgeon: [name], MD;  [facility];  Service: Urology;  Laterality: N/A;  . CYSTOSCOPY WITH FULGERATION N/A 06/01/2016   Procedure: CYSTOSCOPY WITH FULGERATION urethral tumors and bladder neck;  Surgeon: [name], MD;  [facility];  Service: Urology;  Laterality: N/A;  . SHOULDER SURGERY Left 1970's  .  TONSILLECTOMY    . TRANSTHORACIC ECHOCARDIOGRAM  01-18-2017   dr hochrein   moderate LVH, ef 35-40%, diffuse hypokinesis/  trivial AR and MR/  mild TR  . TRANSURETHRAL RESECTION OF BLADDER TUMOR  10/07/2011   Procedure: TRANSURETHRAL RESECTION OF BLADDER TUMOR (TURBT);  Surgeon: Malka So, MD;  Location: [facility];  Service: Urology;  Laterality: N/A;  . TRANSURETHRAL RESECTION OF BLADDER TUMOR N/A 07/10/2015   Procedure: TRANSURETHRAL RESECTION OF BLADDER TUMOR (TURBT), CYSTOSCOPY ;  Surgeon: [name], MD;  [facility];  Service: Urology;  Laterality: N/A;  . TRANSURETHRAL RESECTION OF PROSTATE  03/14/2012   Procedure: TRANSURETHRAL RESECTION OF THE PROSTATE WITH GYRUS INSTRUMENTS;  Surgeon: Malka So, MD;  [facility];  Service: Urology;  Laterality: N/A;     A IV Location/Drains/Wounds Patient Lines/Drains/Airways Status   Active Line/Drains/Airways    Name:   Placement date:   Placement time:   Site:   Days:   Peripheral IV 07/01/19 Right Antecubital   07/01/19  1409    Antecubital   less than 1   Incision (Closed) 01/20/16 Perineum Other (Comment)   01/20/16    1116     1258   Incision (Closed) 12/09/16 Perineum   12/09/16    1134     934   Incision (Closed) 06/09/18 Perineum   06/09/18    1223     387   Incision (Closed) 10/20/18 Penis   10/20/18    1249     254   Wound / Incision (Open or Dehisced) 12/10/18 Non-pressure wound Leg Right   12/10/18    1828    Leg   203          Intake/Output Last 24 hours  Intake/Output Summary (Last 24 hours) at 07/01/2019 1934 Last data filed at 07/01/2019 1710 Gross per 24 hour  Intake 1350 ml  Output --  Net 1350 ml    Labs/Imaging Results for orders placed or performed during the hospital encounter of 07/01/19 (from the past 48 hour(s))  Lactic acid, plasma     Status: None   Collection Time: 07/01/19  2:52 PM  Result Value Ref Range   Lactic Acid, Venous 1.4 0.5 - 1.9 mmol/L    Comment: Performed  at [facility], [address], [place], [place] [address]  Comprehensive metabolic panel     Status: Abnormal   Collection Time: 07/01/19  2:52 PM  Result Value Ref Range   Sodium 141 135 - 145 mmol/L   Potassium 3.9 3.5 - 5.1 mmol/L   Chloride 103 98 - 111 mmol/L   CO2 27 22 - 32 mmol/L   Glucose, Bld 101 (H) 70 - 99 mg/dL    Comment: Glucose reference range applies only to samples taken after fasting for at least 8 hours.   BUN 55 (H) 8 - 23 mg/dL   Creatinine, Ser 2.50 (H) 0.61 - 1.24 mg/dL   Calcium 9.5 8.9 - 10.3 mg/dL   Total Protein 8.0 6.5 - 8.1 g/dL   Albumin 4.5 3.5 - 5.0 g/dL   AST 24 15 - 41 U/L   ALT 16 0 - 44 U/L   Alkaline Phosphatase 157 (H) 38 - 126 U/L   Total Bilirubin 0.9 0.3 - 1.2 mg/dL   GFR calc non Af Amer 21 (L) >60 mL/min   GFR calc Af Amer 24 (L) >60 mL/min   Anion gap 11 5 - 15    Comment: Performed at [facility], [address] [place], [place] [address]  CBC WITH DIFFERENTIAL     Status: Abnormal   Collection Time: 07/01/19  2:52 PM  Result Value Ref Range   WBC 14.3 (H) 4.0 - 10.5 K/uL   RBC 4.96 4.22 - 5.81 MIL/uL   Hemoglobin 13.7 13.0 - 17.0 g/dL   HCT 43.5 39.0 - 52.0 %   MCV 87.7 80.0 - 100.0 fL   MCH 27.6 26.0 - 34.0 pg   MCHC 31.5 30.0 - 36.0 g/dL   RDW 15.0 11.5 - 15.5 %   Platelets 158 150 - 400 K/uL   nRBC 0.0 0.0 - 0.2 %   Neutrophils Relative % 87 %   Neutro Abs 12.3 (H) 1.7 - 7.7 K/uL   Lymphocytes Relative 8 %   Lymphs Abs 1.1 0.7 - 4.0 K/uL   Monocytes Relative 5 %   Monocytes Absolute 0.8 0.1 - 1.0 K/uL   Eosinophils Relative 0 %   Eosinophils Absolute 0.0 0.0 - 0.5 K/uL   Basophils  Relative 0 %   Basophils Absolute 0.0 0.0 - 0.1 K/uL   Immature Granulocytes 0 %   Abs Immature Granulocytes 0.05 0.00 - 0.07 K/uL    Comment: Performed at [facility], [address] [place], [place] [address]  APTT     Status: Abnormal   Collection Time: 07/01/19  2:52 PM  Result Value Ref Range   aPTT 39 (H) 24 - 36 seconds     Comment:        IF BASELINE aPTT IS ELEVATED, SUGGEST PATIENT RISK ASSESSMENT BE USED TO DETERMINE APPROPRIATE ANTICOAGULANT THERAPY. Performed at [facility], [address], [place], [place] [address]   Protime-INR     Status: Abnormal   Collection Time: 07/01/19  2:52 PM  Result Value Ref Range   Prothrombin Time 22.4 (H) 11.4 - 15.2 seconds   INR 2.0 (H) 0.8 - 1.2    Comment: (NOTE) INR goal varies based on device and disease states. Performed at [facility], [address], [place], [place] [address]   Procalcitonin     Status: None   Collection Time: 07/01/19  2:52 PM  Result Value Ref Range   Procalcitonin 2.07 ng/mL    Comment:        Interpretation: PCT > 2 ng/mL: Systemic infection (sepsis) is likely, unless other causes are known. (NOTE)       Sepsis PCT Algorithm           Lower Respiratory Tract                                      Infection PCT Algorithm    ----------------------------     ----------------------------         PCT < 0.25 ng/mL                PCT < 0.10 ng/mL         Strongly encourage             Strongly discourage   discontinuation of antibiotics    initiation of antibiotics    ----------------------------     -----------------------------       PCT 0.25 - 0.50 ng/mL            PCT 0.10 - 0.25 ng/mL               OR       >80% decrease in PCT            Discourage initiation of                                            antibiotics      Encourage discontinuation           of antibiotics    ----------------------------     -----------------------------         PCT >= 0.50 ng/mL              PCT 0.26 - 0.50 ng/mL               AND       <80% decrease in PCT              Encourage initiation of  antibiotics       Encourage continuation           of antibiotics    ----------------------------     -----------------------------        PCT >= 0.50 ng/mL                  PCT > 0.50 ng/mL                AND         increase in PCT                  Strongly encourage                                      initiation of antibiotics    Strongly encourage escalation           of antibiotics                                     -----------------------------                                           PCT <= 0.25 ng/mL                                                 OR                                        > 80% decrease in PCT                                     Discontinue / Do not initiate                                             antibiotics Performed at [facility], [address] [place], [place] [address]   Brain natriuretic peptide     Status: Abnormal   Collection Time: 07/01/19  2:52 PM  Result Value Ref Range   B Natriuretic Peptide 1,507.0 (H) 0.0 - 100.0 pg/mL    Comment: Performed at [facility], [address], [place], [place] [address]  Troponin I (High Sensitivity)     Status: Abnormal   Collection Time: 07/01/19  2:52 PM  Result Value Ref Range   Troponin I (High Sensitivity) 2,252 (HH) <18 ng/L    Comment: CRITICAL RESULT CALLED TO, READ BACK BY AND VERIFIED WITH: ROBIN GENTRY,RN  @1621  07/01/2019 KAY  (NOTE) Elevated high sensitivity troponin I (hsTnI) values and significant  changes across serial measurements may suggest ACS but many other  chronic and acute conditions are known to elevate hsTnI results.  Refer to the Links section for chest pain algorithms and additional  guidance. Performed at [facility], [address], [place], [place]  [address]   POC SARS Coronavirus 2 Ag-ED - Nasal Swab (BD Veritor Kit)     Status: None   Collection Time: 07/01/19  3:15 PM  Result Value Ref Range   SARS Coronavirus 2 Ag NEGATIVE NEGATIVE    Comment: (NOTE) SARS-CoV-2 antigen NOT DETECTED.  Negative results are presumptive.  Negative results do not preclude SARS-CoV-2 infection and should not be used as the sole basis for treatment or other patient management decisions,  including infection  control decisions, particularly in the presence of clinical signs and  symptoms consistent with COVID-19, or in those who have been in contact with the virus.  Negative results must be combined with clinical observations, patient history, and epidemiological information. The expected result is Negative. Fact Sheet for Patients: PodPark.tn Fact Sheet for Healthcare Providers: GiftContent.is This test is not yet approved or cleared by the [place] FDA and  has been authorized for detection and/or diagnosis of SARS-CoV-2 by FDA under an Emergency Use Authorization (EUA).  This EUA will remain in effect (meaning this test can be used) for the duration of  the COVID-19 de claration under Section 564(b)(1) of the Act, 21 U.S.C. section 360bbb-3(b)(1), unless the authorization is terminated or revoked sooner.   Lactic acid, plasma     Status: None   Collection Time: 07/01/19  4:57 PM  Result Value Ref Range   Lactic Acid, Venous 1.8 0.5 - 1.9 mmol/L    Comment: Performed at [facility], [address] [place], [place] [address]  Troponin I (High Sensitivity)     Status: Abnormal   Collection Time: 07/01/19  4:57 PM  Result Value Ref Range   Troponin I (High Sensitivity) 1,906 (HH) <18 ng/L    Comment: CRITICAL VALUE NOTED.  VALUE IS CONSISTENT WITH PREVIOUSLY REPORTED AND CALLED VALUE. (NOTE) Elevated high sensitivity troponin I (hsTnI) values and significant  changes across serial measurements may suggest ACS but many other  chronic and acute conditions are known to elevate hsTnI results.  Refer to the Links section for chest pain algorithms and additional  guidance. Performed at [facility], [address], [place], [place] [address]    *Note: Due to a large number of results and/or encounters for the requested time period, some results have not been displayed. A complete set of results can be found in  Results Review.   DG Chest Port 1 View  Result Date: 07/01/2019 CLINICAL DATA:  Shortness of breath. EXAM: PORTABLE CHEST 1 VIEW COMPARISON:  December 26, 2018. FINDINGS: Stable cardiomediastinal silhouette. No pneumothorax or pleural effusion is noted. Both lungs are clear. The visualized skeletal structures are unremarkable. IMPRESSION: No active disease. Electronically Signed   By: Marijo Conception M.D.   On: 07/01/2019 15:15    Pending Labs Unresulted Labs (From admission, onward)    Start     Ordered   07/02/19 1200  Brain natriuretic peptide  Once,   R     07/01/19 1915   07/02/19 1200  CBC with Differential/Platelet  Once,   R     07/01/19 1915   07/02/19 2993  Basic metabolic panel  Once,   R     07/01/19 1915   07/02/19 7169  Basic metabolic panel  Daily,   R     07/01/19 1812   07/01/19 1621  SARS CORONAVIRUS 2 (TAT 6-24 HRS) Nasopharyngeal Nasopharyngeal Swab  (Tier 3 (TAT 6-24 hrs))  ONCE - STAT,   STAT    Question Answer Comment  Is this test for diagnosis or screening Screening   Symptomatic for COVID-19 as defined by CDC No   Hospitalized for COVID-19 No   Admitted to ICU for COVID-19 No   Previously tested for COVID-19 Yes   Resident in a congregate (group) care setting No   Employed in healthcare setting No      07/01/19 1620   07/01/19 1452  Blood Culture (routine x 2)  BLOOD CULTURE X 2,   STAT     07/01/19 1452   07/01/19 1452  Urinalysis, Routine w reflex microscopic  ONCE - STAT,   STAT     07/01/19 1452   07/01/19 1452  Urine culture  ONCE - STAT,   STAT     07/01/19 1452          Vitals/Pain Today's Vitals   07/01/19 1800 07/01/19 1824 07/01/19 1830 07/01/19 1900  BP: 134/63  (!) 149/64 131/85  Pulse: (!) 45   81  Resp: (!) 35  18 (!) 29  Temp:  (!) 97.5 F (36.4 C)    TempSrc:  Oral    SpO2: 97%  98% 91%  Weight:      Height:      PainSc:        Isolation Precautions Airborne and Contact precautions  Medications Medications   azithromycin (ZITHROMAX) 500 mg in sodium chloride 0.9 % 250 mL IVPB (0 mg Intravenous Stopped 07/01/19 1710)  acetaminophen (TYLENOL) tablet 650 mg (has no administration in time range)  aspirin EC tablet 81 mg (has no administration in time range)  febuxostat (ULORIC) tablet 40 mg (has no administration in time range)  HYDROcodone-acetaminophen (NORCO/VICODIN) 5-325 MG per tablet 1 tablet (has no administration in time range)  isosorbide-hydrALAZINE (BIDIL) 20-37.5 MG per tablet 1 tablet (has no administration in time range)  carvedilol (COREG) tablet 6.25 mg (has no administration in time range)  simvastatin (ZOCOR) tablet 10 mg (has no administration in time range)  donepezil (ARICEPT) tablet 10 mg (has no administration in time range)  sertraline (ZOLOFT) tablet 50 mg (has no administration in time range)  pantoprazole (PROTONIX) EC tablet 40 mg (has no administration in time range)  mirabegron ER (MYRBETRIQ) tablet 25 mg (has no administration in time range)  folic acid (FOLVITE) tablet 1 mg (has no administration in time range)  warfarin (COUMADIN) tablet 2.5 mg (has no administration in time range)  gabapentin (NEURONTIN) capsule 200 mg (has no administration in time range)  albuterol (VENTOLIN HFA) 108 (90 Base) MCG/ACT inhaler 2 puff (has no administration in time range)  Ipratropium-Albuterol (COMBIVENT) respimat 1 puff (has no administration in time range)  fluticasone (FLONASE) 50 MCG/ACT nasal spray 1 spray (has no administration in time range)  sodium chloride flush (NS) 0.9 % injection 3 mL (has no administration in time range)  sodium chloride flush (NS) 0.9 % injection 3 mL (has no administration in time range)  0.9 %  sodium chloride infusion (has no administration in time range)  ondansetron (ZOFRAN) injection 4 mg (has no administration in time range)  potassium chloride SA (KLOR-CON) CR tablet 10 mEq (has no administration in time range)  cefTRIAXone (ROCEPHIN) 1 g in  sodium chloride 0.9 % 100 mL IVPB (has no administration in time range)  azithromycin (ZITHROMAX) tablet 250 mg (has no administration in time range)  furosemide (LASIX) injection 40 mg (has no administration in time range)  sodium chloride 0.9 % bolus 1,000 mL (0 mLs Intravenous Stopped 07/01/19 1555)  cefTRIAXone (ROCEPHIN)  1 g in sodium chloride 0.9 % 100 mL IVPB (0 g Intravenous Stopped 07/01/19 1549)    Mobility non-ambulatory High fall risk   Focused Assessments    R Recommendations: See Admitting Provider Note  Report given to:   Additional Notes:

## 2019-07-02 ENCOUNTER — Inpatient Hospital Stay (HOSPITAL_COMMUNITY): Payer: Medicare Other

## 2019-07-02 ENCOUNTER — Other Ambulatory Visit: Payer: Self-pay

## 2019-07-02 DIAGNOSIS — A4189 Other specified sepsis: Secondary | ICD-10-CM

## 2019-07-02 DIAGNOSIS — N390 Urinary tract infection, site not specified: Secondary | ICD-10-CM | POA: Diagnosis present

## 2019-07-02 DIAGNOSIS — I5021 Acute systolic (congestive) heart failure: Secondary | ICD-10-CM

## 2019-07-02 LAB — BASIC METABOLIC PANEL
Anion gap: 12 (ref 5–15)
Anion gap: 9 (ref 5–15)
BUN: 52 mg/dL — ABNORMAL HIGH (ref 8–23)
BUN: 54 mg/dL — ABNORMAL HIGH (ref 8–23)
CO2: 27 mmol/L (ref 22–32)
CO2: 29 mmol/L (ref 22–32)
Calcium: 9.5 mg/dL (ref 8.9–10.3)
Calcium: 9.1 mg/dL (ref 8.9–10.3)
Chloride: 108 mmol/L (ref 98–111)
Chloride: 106 mmol/L (ref 98–111)
Creatinine, Ser: 2.22 mg/dL — ABNORMAL HIGH (ref 0.61–1.24)
Creatinine, Ser: 2.26 mg/dL — ABNORMAL HIGH (ref 0.61–1.24)
GFR calc Af Amer: 28 mL/min — ABNORMAL LOW (ref >60)
GFR calc Af Amer: 28 mL/min — ABNORMAL LOW (ref >60)
GFR calc non Af Amer: 24 mL/min — ABNORMAL LOW (ref >60)
GFR calc non Af Amer: 24 mL/min — ABNORMAL LOW (ref >60)
Glucose, Bld: 103 mg/dL — ABNORMAL HIGH (ref 70–99)
Glucose, Bld: 117 mg/dL — ABNORMAL HIGH (ref 70–99)
Potassium: 4.1 mmol/L (ref 3.5–5.1)
Potassium: 4.4 mmol/L (ref 3.5–5.1)
Sodium: 147 mmol/L — ABNORMAL HIGH (ref 135–145)
Sodium: 144 mmol/L (ref 135–145)

## 2019-07-02 LAB — ECHOCARDIOGRAM COMPLETE
Height: 65 in
Weight: 2366.86 oz

## 2019-07-02 LAB — BLOOD GAS, ARTERIAL
Acid-Base Excess: 1.1 mmol/L (ref 0.0–2.0)
Bicarbonate: 25 mmol/L (ref 20.0–28.0)
FIO2: 32
O2 Saturation: 98.1 %
Patient temperature: 37
pCO2 arterial: 47.6 mmHg (ref 32.0–48.0)
pH, Arterial: 7.357 (ref 7.350–7.450)
pO2, Arterial: 118 mmHg — ABNORMAL HIGH (ref 83.0–108.0)

## 2019-07-02 LAB — CBC WITH DIFFERENTIAL/PLATELET
Abs Immature Granulocytes: 0.06 10*3/uL (ref 0.00–0.07)
Basophils Absolute: 0 10*3/uL (ref 0.0–0.1)
Basophils Relative: 0 %
Eosinophils Absolute: 0 10*3/uL (ref 0.0–0.5)
Eosinophils Relative: 0 %
HCT: 39.5 % (ref 39.0–52.0)
Hemoglobin: 12.3 g/dL — ABNORMAL LOW (ref 13.0–17.0)
Immature Granulocytes: 1 %
Lymphocytes Relative: 7 %
Lymphs Abs: 0.8 10*3/uL (ref 0.7–4.0)
MCH: 27.6 pg (ref 26.0–34.0)
MCHC: 31.1 g/dL (ref 30.0–36.0)
MCV: 88.8 fL (ref 80.0–100.0)
Monocytes Absolute: 0.8 10*3/uL (ref 0.1–1.0)
Monocytes Relative: 7 %
Neutro Abs: 9.6 10*3/uL — ABNORMAL HIGH (ref 1.7–7.7)
Neutrophils Relative %: 85 %
Platelets: 120 10*3/uL — ABNORMAL LOW (ref 150–400)
RBC: 4.45 MIL/uL (ref 4.22–5.81)
RDW: 15.3 % (ref 11.5–15.5)
WBC: 11.3 10*3/uL — ABNORMAL HIGH (ref 4.0–10.5)
nRBC: 0 % (ref 0.0–0.2)

## 2019-07-02 LAB — CBC
HCT: 41.7 % (ref 39.0–52.0)
Hemoglobin: 13 g/dL (ref 13.0–17.0)
MCH: 27.9 pg (ref 26.0–34.0)
MCHC: 31.2 g/dL (ref 30.0–36.0)
MCV: 89.5 fL (ref 80.0–100.0)
Platelets: 131 10*3/uL — ABNORMAL LOW (ref 150–400)
RBC: 4.66 MIL/uL (ref 4.22–5.81)
RDW: 15.5 % (ref 11.5–15.5)
WBC: 12.5 10*3/uL — ABNORMAL HIGH (ref 4.0–10.5)
nRBC: 0 % (ref 0.0–0.2)

## 2019-07-02 LAB — PROTIME-INR
INR: 2.1 — ABNORMAL HIGH (ref 0.8–1.2)
Prothrombin Time: 23.7 seconds — ABNORMAL HIGH (ref 11.4–15.2)

## 2019-07-02 LAB — BRAIN NATRIURETIC PEPTIDE: B Natriuretic Peptide: 1193 pg/mL — ABNORMAL HIGH (ref 0.0–100.0)

## 2019-07-02 LAB — SARS CORONAVIRUS 2 (TAT 6-24 HRS): SARS Coronavirus 2: NEGATIVE

## 2019-07-02 LAB — MAGNESIUM: Magnesium: 2.4 mg/dL (ref 1.7–2.4)

## 2019-07-02 LAB — GLUCOSE, CAPILLARY: Glucose-Capillary: 136 mg/dL — ABNORMAL HIGH (ref 70–99)

## 2019-07-02 MED ORDER — ORAL CARE MOUTH RINSE
15.0000 mL | Freq: Two times a day (BID) | OROMUCOSAL | Status: DC
  Administered 2019-07-02 – 2019-07-09 (×12): 15 mL via OROMUCOSAL

## 2019-07-02 MED ORDER — FUROSEMIDE 10 MG/ML IJ SOLN
40.0000 mg | Freq: Two times a day (BID) | INTRAMUSCULAR | Status: DC
  Administered 2019-07-03 – 2019-07-05 (×6): 40 mg via INTRAVENOUS
  Filled 2019-07-02 (×7): qty 4

## 2019-07-02 MED ORDER — SODIUM CHLORIDE 0.9 % IV SOLN: INTRAVENOUS | Status: DC

## 2019-07-02 MED ORDER — WARFARIN - PHARMACIST DOSING INPATIENT: Freq: Every day | Status: DC

## 2019-07-02 MED ORDER — METHYLPREDNISOLONE SODIUM SUCC 40 MG IJ SOLR
40.0000 mg | Freq: Two times a day (BID) | INTRAMUSCULAR | Status: AC
  Administered 2019-07-02 – 2019-07-04 (×4): 40 mg via INTRAVENOUS
  Filled 2019-07-02 (×4): qty 1

## 2019-07-02 MED ORDER — CHLORHEXIDINE GLUCONATE CLOTH 2 % EX PADS
6.0000 | MEDICATED_PAD | Freq: Every day | CUTANEOUS | Status: DC
  Administered 2019-07-02 – 2019-07-03 (×2): 6 via TOPICAL

## 2019-07-02 MED ORDER — GUAIFENESIN ER 600 MG PO TB12
600.0000 mg | ORAL_TABLET | Freq: Two times a day (BID) | ORAL | Status: DC
  Administered 2019-07-02 – 2019-07-09 (×14): 600 mg via ORAL
  Filled 2019-07-02 (×14): qty 1

## 2019-07-02 MED ORDER — IOHEXOL 350 MG/ML SOLN
100.0000 mL | Freq: Once | INTRAVENOUS | Status: AC | PRN
  Administered 2019-07-02: 100 mL via INTRAVENOUS

## 2019-07-02 NOTE — TOC Initial Note (Signed)
Transition of Care [facility]) - Initial/Assessment Note    Patient Details  Name: Scott [name] MRN: [id number] Date of Birth: 15-Nov-1921  Transition of Care [facility]) CM/SW Contact:    Boneta Lucks, RN Phone Number: 07/02/2019, 11:37 AM  Clinical Narrative:       Patient admitted with benign localized hyperplasia of prostate with urinary obstruction. Patient has a high risk for readmission. TOC consult received for HH/DME.  Cm spoke with nephew Annie Main, he is at the bedside. States they have 24/7 caregivers. [facility] and wishes to take him home with [facility].  Annie Main is a Quarry manager and oversees his care. They have all equipment necessary.  Patient currently getting a echo. TOC to follow.             Expected Discharge Plan: [place] Barriers to Discharge: Continued Medical Work up   Patient Goals and CMS Choice Patient states their goals for this hospitalization and ongoing recovery are:: to go back home. CMS Medicare.gov Compare Post Acute Care list provided to:: Patient Represenative (must comment) Choice offered to / list presented to : Adult Children  Expected Discharge Plan and Services Expected Discharge Plan: [place]       Living arrangements for the past 2 months: Single Family Home                  Prior Living Arrangements/Services Living arrangements for the past 2 months: Single Family Home Lives with:: Other (Comment)(care givers)   Do you feel safe going back to the place where you live?: Yes      Need for Family Participation in Patient Care: Yes (Comment) Care giver support system in place?: Yes (comment) Current home services: [facility] Criminal Activity/Legal Involvement Pertinent to Current Situation/Hospitalization: No - Comment as needed  Activities of Daily Living Home Assistive Devices/Equipment: Eyeglasses, Transport planner, Nebulizer ADL Screening (condition at time of admission) Patient's  cognitive ability adequate to safely complete daily activities?: Yes Is the patient deaf or have difficulty hearing?: No Does the patient have difficulty seeing, even when wearing glasses/contacts?: Yes Does the patient have difficulty concentrating, remembering, or making decisions?: Yes Patient able to express need for assistance with ADLs?: Yes Does the patient have difficulty dressing or bathing?: Yes Independently performs ADLs?: No Communication: Independent Dressing (OT): Independent Grooming: Independent Feeding: Independent Bathing: Needs assistance Is this a change from baseline?: Pre-admission baseline Toileting: Needs assistance Is this a change from baseline?: Pre-admission baseline In/Out Bed: Needs assistance Is this a change from baseline?: Pre-admission baseline Walks in Home: Independent with device (comment) Does the patient have difficulty walking or climbing stairs?: Yes Weakness of Legs: Right Weakness of Arms/Hands: None  Permission Sought/Granted      Share Information with NAME: Devona Konig     Permission granted to share info w Relationship: nephew     Emotional Assessment     Affect (typically observed): Accepting   Alcohol / Substance Use: Not Applicable Psych Involvement: No (comment)  Admission diagnosis:  Hypoxia [R09.02] CAP (community acquired pneumonia) [J18.9] Acute combined systolic (congestive) and diastolic (congestive) heart failure (HCC) [I50.41] Community acquired pneumonia, unspecified laterality [J18.9] Sepsis, due to unspecified organism, unspecified whether acute organ dysfunction present [facility]) [A41.9] Patient Active Problem List   Diagnosis Date Noted  . CAP (community acquired pneumonia) 07/01/2019  . Acute combined systolic (congestive) and diastolic (congestive) heart failure ([place]) 07/01/2019  . Foot pain 12/21/2018  .  Cellulitis 12/21/2018  . CKD (chronic kidney disease) stage 3, GFR 30-59 ml/min 12/11/2018  .  Dyspnea 12/10/2018  . Alzheimer disease ([place]) 09/07/2018  . SIRS (systemic inflammatory response syndrome) ([place]) 06/22/2018  . Paroxysmal atrial fibrillation ([place]) 01/18/2017  . PVC (pulmonary venous congestion) 01/18/2017  . Thoracic aortic atherosclerosis ([place]) 11/26/2016  . Thrombocytopenia ([place]) 08/04/2016  . Chronic kidney disease (CKD) stage G3a/A1, moderately decreased glomerular filtration rate (GFR) between 45-59 mL/min/1.73 square meter and albuminuria creatinine ratio less than 30 mg/g (HCC) 08/04/2016  . Essential hypertension 11/29/2015  . Bradycardia 11/28/2015  . Malignant neoplasm of lateral wall of urinary bladder ([place]) 07/10/2015  . Status post above-knee amputation of left lower extremity ([place]) 10/18/2014  . Tachyarrhythmia 10/05/2014  . Aspiration pneumonia ([place]) 10/05/2014  . Sepsis due to other etiology ([place]) 10/03/2014  . Atherosclerotic PVD with ulceration ([place]) 10/03/2014  . Atrial fibrillation with RVR ([place]) 10/03/2014  . Hyperkalemia 10/03/2014  . PAD (peripheral artery disease) ([place]) 09/24/2014  . NSTEMI (non-ST elevated myocardial infarction) ([place]) 10/09/2013  . High risk medication use 05/17/2013  . BPH (benign prostatic hyperplasia) 05/17/2013  . Vitamin D deficiency 05/17/2013  . History of pulmonary embolism (2014) 04/17/2013  . NSVT (nonsustained ventricular tachycardia)- not an ICD candidate 04/17/2013  . NICM (nonischemic cardiomyopathy)- EF 20-25% by Echo 04/12/13 04/17/2013  . Chronic combined systolic and diastolic heart failure ([place]) 03/15/2013  . DVT of lower extremity, bilateral ([place]) 03/15/2013  . Constipation 11/02/2012  . Syncope 06/21/2012  . Bladder cancer ([place]) 03/15/2012  . Benign localized hyperplasia of prostate with urinary obstruction 03/15/2012  . Hyperlipidemia   . PVCs (premature ventricular contractions)   . CAD- non obstructive disease by cath 3/14    PCP:  Dettinger, Fransisca Kaufmann, MD Pharmacy:   [facility], [place] [address] Phone: [phone] Fax: [phone]   Readmission Risk Interventions Readmission Risk Prevention Plan 07/02/2019 12/11/2018 06/23/2018  Transportation Screening Complete Complete Complete  PCP or Specialist Appt within 3-5 Days - - Complete  HRI or [place] - - Not Complete  HRI or Home Care Consult comments - - patient declines  Social Work Scientific laboratory technician for [place] Planning/Counseling - - Complete  Palliative Care Screening - - Not Applicable  Medication Review Press photographer) Complete Complete Complete  PCP or Specialist appointment within 3-5 days of discharge Not Complete Complete -  [place] or Home Care Consult Complete Not Complete -  [place] or Home Care Consult Pt Refusal Comments - not ready -  SW Recovery Care/Counseling Consult Complete Complete -  Palliative Care Screening Not Applicable Not Applicable -  Skilled Nursing Facility Not Complete Not Complete -  SNF Comments - 24/7 care -  Some recent data might be hidden

## 2019-07-02 NOTE — Progress Notes (Signed)
  Caregiver [name])  at bedside alerted [place] to sudden change in mentation---at 1810 pm  Patient became somewhat nonverbal, less responsive -Stat CT head without contrast ordered and performed without acute findings noted -Telemetry neurology consult requested,  -At this time plan is for CTA neck, CTA head and CT with cerebral perfusion -ABG without hypercapnia  -Serial neuro checks, serial vital signs noted  --Consultations with radiologist and telemetry neurologist -Patient's nephew and caregiver [name] updated -Total critical care time  43 minutes  -Please see full progress note completed again today  Roxan Hockey, MD

## 2019-07-02 NOTE — Progress Notes (Signed)
Informed by central telemetry that patient had run of VTach. Patient denies any chest pain or distress at this time. MD is currently in room assessing patient. Vitals are as follows     07/02/19 1320  Vitals  Temp (!) 100.9 F (38.3 C)  Temp Source Oral  BP (!) 130/107  BP Location Right Arm  BP Method Automatic  Patient Position (if appropriate) Lying  Pulse Rate 69  Pulse Rate Source Dinamap  Resp 20  Level of Consciousness  Level of Consciousness Alert  Oxygen Therapy  SpO2 98 %  O2 Device Nasal Cannula  O2 Flow Rate (L/min) 3 L/min  Pain Assessment  Pain Scale 0-10  Pain Score 0  MEWS Score  MEWS Temp 1  MEWS Systolic 0  MEWS Pulse 0  MEWS RR 0  MEWS LOC 0  MEWS Score 1  MEWS Score Color Green   Will continue to monitor patient.

## 2019-07-02 NOTE — Progress Notes (Signed)
Patient Demographics:    Scott [name], is a 86 y.o. male, DOB - Nov 17, 1921, [id number]  Admit date - 07/01/2019   Admitting Physician Neena Rhymes, MD  Outpatient Primary MD for the patient is Dettinger, Fransisca Kaufmann, MD  LOS - 1   Chief Complaint  Patient presents with  . Altered Mental Status        Subjective:    [name] today has no  emesis,  -continues to deny chest pain,   -Continues to spike fevers,  -Caregiver [name] at bedside -Oral intake is fair -Episodes of asymptomatic tachyarrhythmias   Assessment  & Plan :    Principal Problem:   Sepsis due to other etiology [facility]) Active Problems:   UTI (urinary tract infection)   Paroxysmal atrial fibrillation (HCC)   Acute combined systolic (congestive) and diastolic (congestive) heart failure (HCC)   Benign localized hyperplasia of prostate with urinary obstruction   Chronic combined systolic and diastolic heart failure (HCC)   NICM (nonischemic cardiomyopathy)- EF 20-25% by Echo 04/12/13   Essential hypertension   Chronic kidney disease (CKD) stage G3a/A1, moderately decreased glomerular filtration rate (GFR) between 45-59 mL/min/1.73 square meter and albuminuria creatinine ratio less than 30 mg/g (HCC)  Brief Summary:- 86 y.o. 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 fib, hypertension, chronic kidney disease stage III, COPD, bladder cancer, CAD, left AKA admitted with fevers and sepsis pathophysiology on 07/01/2019 with concerns about urinary source  A/p 1)Sepsis Secondary to Urinary Source---on admission pt met sepsis criteria with fevers, tachycardia , tachypnea , Leukocytosis and Hypoxia  -fevers persist---- -WBC is down to 11.3 from 14.3 however anticipate further rising WBC with initiation of steroid therapy for COPD -Continue Rocephin/Azithr (started  07/01/19) -UA suspicious for possible UTI however patient has history of bladder cancer and BPH -Chest x-ray without acute findings, however patient has wheezing and hypoxia -blood and urine cultures pending  2)HFrEF--- patient with combined diastolic and systolic dysfunction CHF last known EF 35 to 40%, repeat echo pending--- chest x-ray without overt CHF exacerbation, BNP elevated at 1507 (current BNP level which are similar to patient's prior) -Patient with wheezing, shortness of breath and hypoxia--more consistent with COPD exacerbation rather CHF exacerbation -Troponins are elevated peaking at 2252--- currently trending down, -Patient with history of previous elevated troponins -Await echocardiogram and if there is a significant drop in EF or other significant changes from prior will consider cardiology consult -Given concerns about sepsis avoid over aggressive diuresis okay to switch from IV Lasix to p.o. Lasix -Repeat chest x-ray in a.m.  3)Acute COPD Exacerbation- no definite pneumonia,  treat empirically with IV Solu-Medrol 40 mg every 12 hours, give mucolytics, antibiotics (as above in # 1)  and bronchodilators as ordered, supplemental oxygen as ordered.   4)Acute hypoxic respiratory failure secondary to #1 and #3 above--continue submental oxygen  5)H/o PAFib--- on telemetry episodes of tachyarrhythmia --atrial tachycardia -Potassium was 4.4, check magnesium -INR is 2.1, continue Coumadin anticoagulation -Continue Coreg for rate control at 6.25 mg twice daily  6)CKD stage -  IIIb---- appears at baseline,  creatinine on admission= 2.5 , baseline creatinine =  2.19 (01/08/19)  , creatinine is now=  2.26, renally adjust medications, avoid  nephrotoxic agents / dehydration  / hypotension  7)H/o PAD/CAD--- stable, continue aspirin, continue Coreg, -continue simvastatin, continue isosorbide and hydralazine combo -Continue Coumadin therapy  8)History of DVT/PE -Continue Coumadin  therapy  9)Dementia without behavioral disturbances -Continue Aricept and Zoloft, no significant cognitive or memory deficits on my exam today   Disposition/Need for in-Hospital Stay- patient unable to be discharged at this time due to --acute hypoxia and concerns about sepsis requiring IV antibiotics pending cultures --Patient continues to spike fevers -Not medically ready for discharge however anticipate discharge home with home health when medically ready  Code Status : DNR  Family Communication:   (patient is alert, awake and coherent) --- Discussed with caregiver Annie Main at bedside  Consults  :  na  DVT Prophylaxis  : Coumadin therapy SCDs   Lab Results  Component Value Date   PLT 120 (L) 07/02/2019    Inpatient Medications  Scheduled Meds: . aspirin EC  81 mg Oral Daily  . azithromycin  250 mg Oral Daily  . carvedilol  6.25 mg Oral BID WC  . donepezil  10 mg Oral QHS  . febuxostat  40 mg Oral Daily  . fluticasone  1 spray Each Nare QHS  . folic acid  1 mg Oral Daily  . furosemide  40 mg Intravenous Q8H  . gabapentin  200 mg Oral BID  . isosorbide dinitrate  20 mg Oral TID   And  . hydrALAZINE  37.5 mg Oral TID  . mouth rinse  15 mL Mouth Rinse BID  . methylPREDNISolone (SOLU-MEDROL) injection  40 mg Intravenous Q12H  . mirabegron ER  25 mg Oral q morning - 10a  . pantoprazole  40 mg Oral Daily  . potassium chloride  10 mEq Oral BID  . sertraline  50 mg Oral q morning - 10a  . simvastatin  10 mg Oral q1800  . sodium chloride flush  3 mL Intravenous Q12H  . warfarin  2.5 mg Oral Once per day on Sun Tue Wed Thu Sat  . warfarin  5 mg Oral Once per day on Mon Fri  . Warfarin - Pharmacist Dosing Inpatient   Does not apply q1600   Continuous Infusions: . sodium chloride    . cefTRIAXone (ROCEPHIN)  IV     PRN Meds:.sodium chloride, acetaminophen, HYDROcodone-acetaminophen, ipratropium-albuterol, ondansetron (ZOFRAN) IV, sodium chloride  flush    Anti-infectives (From admission, onward)   Start     Dose/Rate Route Frequency Ordered Stop   07/02/19 1600  azithromycin (ZITHROMAX) tablet 250 mg     250 mg Oral Daily 07/01/19 1812 07/06/19 1559   07/02/19 1500  cefTRIAXone (ROCEPHIN) 1 g in sodium chloride 0.9 % 100 mL IVPB     1 g 200 mL/hr over 30 Minutes Intravenous Every 24 hours 07/01/19 1812     07/01/19 1500  cefTRIAXone (ROCEPHIN) 1 g in sodium chloride 0.9 % 100 mL IVPB     1 g 200 mL/hr over 30 Minutes Intravenous  Once 07/01/19 1452 07/01/19 1549   07/01/19 1500  azithromycin (ZITHROMAX) 500 mg in sodium chloride 0.9 % 250 mL IVPB  Status:  Discontinued     500 mg 250 mL/hr over 60 Minutes Intravenous Every 24 hours 07/01/19 1452 07/01/19 2048        Objective:   Vitals:   07/02/19 0701 07/02/19 0908 07/02/19 1035 07/02/19 1320  BP:  (!) 130/56  (!) 130/107  Pulse:  (!) 40  69  Resp:    20  Temp:    (!) 100.9 F (38.3 C)  TempSrc:    Oral  SpO2:  100% 98% 98%  Weight: 67.1 kg     Height:        Wt Readings from Last 3 Encounters:  07/02/19 67.1 kg  01/26/19 86.2 kg  12/26/18 86.2 kg     Intake/Output Summary (Last 24 hours) at 07/02/2019 1346 Last data filed at 07/02/2019 0701 Gross per 24 hour  Intake 1470 ml  Output 450 ml  Net 1020 ml    Physical Exam Gen:- Awake Alert,  In no apparent distress  HEENT:- [place].AT, No sclera icterus Neck-Supple Neck,No JVD,.  Lungs-slightly diminished air movement, scattered wheezes CV- S1, S2 normal, irregularly irregular, tachycardic Abd-  +ve B.Sounds, Abd Soft, No tenderness, no CVA tenderness Extremity/Skin:- No  edema, pedal pulses present, Lt BKA Psych-affect is appropriate, oriented x3 Neuro-generalized weakness no new focal deficits, no tremors   Data Review:   Micro Results Recent Results (from the past 240 hour(s))  Blood Culture (routine x 2)     Status: None (Preliminary result)   Collection Time: 07/01/19  2:52 PM   Specimen: BLOOD   Result Value Ref Range Status   Specimen Description BLOOD  Final   Special Requests NONE  Final   Culture   Final    NO GROWTH < 24 HOURS Performed at [facility], [address] [place], [place] [address]    Report Status PENDING  Incomplete  Blood Culture (routine x 2)     Status: None (Preliminary result)   Collection Time: 07/01/19  3:15 PM   Specimen: BLOOD  Result Value Ref Range Status   Specimen Description BLOOD  Final   Special Requests NONE  Final   Culture   Final    NO GROWTH < 24 HOURS Performed at [facility], [address], [place], [place] [address]    Report Status PENDING  Incomplete  SARS CORONAVIRUS 2 (TAT 6-24 HRS) Nasopharyngeal Nasopharyngeal Swab     Status: None   Collection Time: 07/01/19  4:21 PM   Specimen: Nasopharyngeal Swab  Result Value Ref Range Status   SARS Coronavirus 2 NEGATIVE NEGATIVE Final    Comment: (NOTE) SARS-CoV-2 target nucleic acids are NOT DETECTED. The SARS-CoV-2 RNA is generally detectable in upper and lower respiratory specimens during the acute phase of infection. Negative results do not preclude SARS-CoV-2 infection, do not rule out co-infections with other pathogens, and should not be used as the sole basis for treatment or other patient management decisions. Negative results must be combined with clinical observations, patient history, and epidemiological information. The expected result is Negative. Fact Sheet for Patients: SugarRoll.be Fact Sheet for Healthcare Providers: https://www.woods-mathews.com/ This test is not yet approved or cleared by the [place] FDA and  has been authorized for detection and/or diagnosis of SARS-CoV-2 by FDA under an Emergency Use Authorization (EUA). This EUA will remain  in effect (meaning this test can be used) for the duration of the COVID-19 declaration under Section 56 4(b)(1) of the Act, 21 U.S.C. section 360bbb-3(b)(1), unless the  authorization is terminated or revoked sooner. Performed at [facility], [place] [address] [place], [place] [address]     Radiology Reports DG Chest [place] 1 View  Result Date: 07/01/2019 CLINICAL DATA:  Shortness of breath. EXAM: PORTABLE CHEST 1 VIEW COMPARISON:  December 26, 2018. FINDINGS: Stable cardiomediastinal silhouette. No pneumothorax or pleural effusion is noted. Both lungs are clear. The visualized skeletal structures  are unremarkable. IMPRESSION: No active disease. Electronically Signed   By: Marijo Conception M.D.   On: 07/01/2019 15:15     CBC Recent Labs  Lab 07/01/19 1452 07/02/19 0443 07/02/19 1247  WBC 14.3* 12.5* 11.3*  HGB 13.7 13.0 12.3*  HCT 43.5 41.7 39.5  PLT 158 131* 120*  MCV 87.7 89.5 88.8  MCH 27.6 27.9 27.6  MCHC 31.5 31.2 31.1  RDW 15.0 15.5 15.3  LYMPHSABS 1.1  --  0.8  MONOABS 0.8  --  0.8  EOSABS 0.0  --  0.0  BASOSABS 0.0  --  0.0    Chemistries  Recent Labs  Lab 07/01/19 1452 07/02/19 0443 07/02/19 1247  NA 141 147* 144  K 3.9 4.1 4.4  CL 103 108 106  CO2 27 27 29   GLUCOSE 101* 103* 117*  BUN 55* 52* 54*  CREATININE 2.50* 2.22* 2.26*  CALCIUM 9.5 9.5 9.1  MG  --  2.4  --   AST 24  --   --   ALT 16  --   --   ALKPHOS 157*  --   --   BILITOT 0.9  --   --    ------------------------------------------------------------------------------------------------------------------ No results for input(s): CHOL, HDL, LDLCALC, TRIG, CHOLHDL, LDLDIRECT in the last 72 hours.  Lab Results  Component Value Date   HGBA1C 5.8 (H) 11/28/2015   ------------------------------------------------------------------------------------------------------------------ No results for input(s): TSH, T4TOTAL, T3FREE, THYROIDAB in the last 72 hours.  Invalid input(s): FREET3 ------------------------------------------------------------------------------------------------------------------ No results for input(s): VITAMINB12, FOLATE, FERRITIN, TIBC,  IRON, RETICCTPCT in the last 72 hours.  Coagulation profile Recent Labs  Lab 07/01/19 1452 07/02/19 0443  INR 2.0* 2.1*    No results for input(s): DDIMER in the last 72 hours.  Cardiac Enzymes No results for input(s): CKMB, TROPONINI, MYOGLOBIN in the last 168 hours.  Invalid input(s): CK ------------------------------------------------------------------------------------------------------------------    Component Value Date/Time   BNP 1,193.0 (H) 07/02/2019 1247    Roxan Hockey M.D on 07/02/2019 at 1:46 PM  Go to [email or web address] - for contact info  [facility] - Office  [phone]

## 2019-07-02 NOTE — Progress Notes (Signed)
ANTICOAGULATION CONSULT NOTE -   Pharmacy Consult for warfarin dosing  Indication: atrial fibrillation   Allergies  Allergen Reactions  . Lipitor [Atorvastatin] Other (See Comments)    Unknown rxn per pt      Patient Measurements: Last Weight  Most recent update: 07/02/2019  7:01 AM   Weight  67.1 kg (147 lb 14.9 oz)           Body mass index is 24.62 kg/m. Scott [name]               Temp: 99.2 F (37.3 C) (03/22 0446) Temp Source: Oral (03/22 0446) BP: 141/85 (03/22 0446) Pulse Rate: 92 (03/22 0446)  Labs: Recent Labs    07/01/19 1452 07/02/19 0443  HGB 13.7 13.0  HCT 43.5 41.7  PLT 158 131*  APTT 39*  --   LABPROT 22.4* 23.7*  INR 2.0* 2.1*  CREATININE 2.50* 2.22*    Estimated Creatinine Clearance: 17.3 mL/min (A) (by C-G formula based on SCr of 2.22 mg/dL (H)).     Medications:  Medications Prior to Admission  Medication Sig Dispense Refill Last Dose  . acetaminophen (TYLENOL) 325 MG tablet Take 2 tablets (650 mg total) by mouth every 6 (six) hours as needed for mild pain (or Fever >/= 101).   06/30/2019 at 1900  . albuterol (PROVENTIL) (2.5 MG/3ML) 0.083% nebulizer solution NEBULIZE 1 VIAL EVERY 6 HOURS AS NEEDED FOR WHEEZING OR SHORTNESS OF BREATH (Patient taking differently: Take 2.5 mg by nebulization every 6 (six) hours as needed for wheezing or shortness of breath. ) 180 mL 3 07/01/2019 at Unknown time  . albuterol (VENTOLIN HFA) 108 (90 Base) MCG/ACT inhaler Inhale 2 puffs into the lungs every 6 (six) hours as needed for wheezing or shortness of breath. 18 g 3 07/01/2019 at Unknown time  . aspirin EC 81 MG EC tablet Take 1 tablet (81 mg total) by mouth daily.   06/30/2019 at 0930  . BIDIL 20-37.5 MG tablet TAKE 1 TABLET BY MOUTH 3 TIMES DAILY. 90 tablet 0 06/30/2019 at Unknown time  . carvedilol (COREG) 6.25 MG tablet TAKE  (1)  TABLET TWICE A DAY. (Patient taking differently: Take 6.25 mg by mouth 2 (two) times daily with a meal. ) 60 tablet 0 06/30/2019 at  1900  . COMBIVENT RESPIMAT 20-100 MCG/ACT AERS respimat 1 PUFF EVERY 4 HOURS AS NEEDED FOR COUGH, WHEEZING, OR SHORTNESS OF BREATH 4 g 0 06/30/2019 at Unknown time  . donepezil (ARICEPT) 10 MG tablet TAKE ONE TABLET AT BEDTIME 30 tablet 0 06/30/2019 at Unknown time  . febuxostat (ULORIC) 40 MG tablet TAKE 1 TABLET DAILY 30 tablet 0 06/30/2019 at Unknown time  . fluticasone (FLONASE) 50 MCG/ACT nasal spray Place 1 spray into both nostrils daily as needed for allergies or rhinitis. (Patient taking differently: Place 1 spray into both nostrils at bedtime. ) 16 g 2 06/30/2019 at Unknown time  . folic acid (FOLVITE) 1 MG tablet TAKE 1 TABLET DAILY 30 tablet 2 06/30/2019 at Unknown time  . furosemide (LASIX) 40 MG tablet TAKE 1 TABLET 2 TIMES A DAY 60 tablet 0 06/30/2019 at Unknown time  . gabapentin (NEURONTIN) 100 MG capsule TAKE 2 CAPSULES TWICE A DAY 120 capsule 0 06/30/2019 at Unknown time  . HYDROcodone-acetaminophen (NORCO/VICODIN) 5-325 MG tablet Take 1 tablet by mouth 2 (two) times daily as needed for moderate pain. 60 tablet 0 Past Month at Unknown time  . ipratropium (ATROVENT) 0.02 % nebulizer solution Take 0.5 mg by  nebulization every 6 (six) hours as needed for wheezing or shortness of breath.   06/30/2019 at Unknown time  . MYRBETRIQ 25 MG TB24 tablet Take 25 mg by mouth every morning.    06/30/2019 at Unknown time  . pantoprazole (PROTONIX) 40 MG tablet TAKE 1 TABLET DAILY 90 tablet 0 06/30/2019 at Unknown time  . sertraline (ZOLOFT) 50 MG tablet TAKE 1 TABLET IN THE MORNING 30 tablet 2 06/30/2019 at Unknown time  . simvastatin (ZOCOR) 10 MG tablet TAKE 1 TABLET ONCE DAILY IN THE EVENING (Patient taking differently: Take 10 mg by mouth daily at 6 PM. ) 90 tablet 0 06/30/2019 at Unknown time  . warfarin (COUMADIN) 2.5 MG tablet Take 2.5 mg by mouth daily except Mondays and Fridays take 5 mg 40 tablet 2 06/30/2019 at 0930   Scheduled:  . aspirin EC  81 mg Oral Daily  . azithromycin  250 mg Oral Daily   . carvedilol  6.25 mg Oral BID WC  . donepezil  10 mg Oral QHS  . febuxostat  40 mg Oral Daily  . fluticasone  1 spray Each Nare QHS  . folic acid  1 mg Oral Daily  . furosemide  40 mg Intravenous Q8H  . gabapentin  200 mg Oral BID  . isosorbide dinitrate  20 mg Oral TID   And  . hydrALAZINE  37.5 mg Oral TID  . mouth rinse  15 mL Mouth Rinse BID  . mirabegron ER  25 mg Oral q morning - 10a  . pantoprazole  40 mg Oral Daily  . potassium chloride  10 mEq Oral BID  . sertraline  50 mg Oral q morning - 10a  . simvastatin  10 mg Oral q1800  . sodium chloride flush  3 mL Intravenous Q12H  . warfarin  2.5 mg Oral Once per day on Sun Tue Wed Thu Sat  . warfarin  5 mg Oral Once per day on Mon Fri  . Warfarin - Pharmacist Dosing Inpatient   Does not apply q1600   Infusions:  . sodium chloride    . cefTRIAXone (ROCEPHIN)  IV     PRN: sodium chloride, acetaminophen, HYDROcodone-acetaminophen, ipratropium-albuterol, ondansetron (ZOFRAN) IV, sodium chloride flush Anti-infectives (From admission, onward)   Start     Dose/Rate Route Frequency Ordered Stop   07/02/19 1600  azithromycin (ZITHROMAX) tablet 250 mg     250 mg Oral Daily 07/01/19 1812 07/06/19 1559   07/02/19 1500  cefTRIAXone (ROCEPHIN) 1 g in sodium chloride 0.9 % 100 mL IVPB     1 g 200 mL/hr over 30 Minutes Intravenous Every 24 hours 07/01/19 1812     07/01/19 1500  cefTRIAXone (ROCEPHIN) 1 g in sodium chloride 0.9 % 100 mL IVPB     1 g 200 mL/hr over 30 Minutes Intravenous  Once 07/01/19 1452 07/01/19 1549   07/01/19 1500  azithromycin (ZITHROMAX) 500 mg in sodium chloride 0.9 % 250 mL IVPB  Status:  Discontinued     500 mg 250 mL/hr over 60 Minutes Intravenous Every 24 hours 07/01/19 1452 07/01/19 2048      Goal of Therapy:  INR 2-3 Monitor platelets by anticoagulation protocol: Yes    Prior to Admission Warfarin Dosing:  Scott [name] takes 2.5mg  of warfarin everyday of the week except MF, takes 5mg      INR was  2.1 Lab Results  Component Value Date   INR 2.1 (H) 07/02/2019   INR 2.0 (H) 07/01/2019   INR 2.9 (  H) 12/26/2018    Assessment: Scott [name] a 86 y.o. male requires anticoagulation with warfarin for the indication of  atrial fibrillation. Warfarin will be initiated inpatient following pharmacy protocol per pharmacy consult. Patient most recent blood work is as follows: CBC Latest Ref Rng & Units 07/02/2019 07/01/2019 01/30/2019  WBC 4.0 - 10.5 K/uL 12.5(H) 14.3(H) 4.6  Hemoglobin 13.0 - 17.0 g/dL 13.0 13.7 13.4  Hematocrit 39.0 - 52.0 % 41.7 43.5 41.1  Platelets 150 - 400 K/uL 131(L) 158 180     Plan: Warfarin 2.5mg  po every day except 5mg  on Mondays and Fridays Monitor CBC MWF with am labs   Monitor INR daily Monitor for signs and symptoms of bleeding   Margot Ables, PharmD Clinical Pharmacist 07/02/2019 8:43 AM

## 2019-07-02 NOTE — Plan of Care (Signed)
  Problem: Acute Rehab PT Goals(only PT should resolve) Goal: Pt Will Go Supine/Side To Sit Outcome: Progressing Flowsheets (Taken 07/02/2019 1458) Pt will go Supine/Side to Sit: with supervision   Problem: Acute Rehab PT Goals(only PT should resolve) Goal: Patient Will Transfer Sit To/From Stand Outcome: Progressing Flowsheets (Taken 07/02/2019 1458) Patient will transfer sit to/from stand:  with min guard assist  with minimal assist   Problem: Acute Rehab PT Goals(only PT should resolve) Goal: Pt Will Transfer Bed To Chair/Chair To Bed Outcome: Progressing Flowsheets (Taken 07/02/2019 1458) Pt will Transfer Bed to Chair/Chair to Bed:  min guard assist  with min assist   2:59 PM, 07/02/19 Lonell Grandchild, MPT Physical Therapist with [facility] [phone] office [phone] mobile phone

## 2019-07-02 NOTE — TOC Progression Note (Signed)
Transition of Care [facility]) - Progression Note    Patient Details  Name: HALSEY CRUMEDY MRN: [id number] Date of Birth: [birth year]  Transition of Care [facility]) CM/SW [place], LCSW Phone Number: 07/02/2019, 3:46 PM  Clinical Narrative: Spoke with Romualdo Bolk with [facility] for [facility] referral. TOC to continue to follow.   Expected Discharge Plan: [place] Barriers to Discharge: Continued Medical Work up  Expected Discharge Plan and Services Expected Discharge Plan: [place] arrangements for the past 2 months: Single Family Home                   HH Arranged: RN, PT [facility] Agency: [place] ([facility]) Date [place]: 07/02/19 Time [place]: [place] Representative spoke with at [place]: Romualdo Bolk: [phone]   Readmission Risk Interventions Readmission Risk Prevention Plan 07/02/2019 12/11/2018 06/23/2018  Transportation Screening Complete Complete Complete  PCP or Specialist Appt within 3-5 Days - - Complete  HRI or [place] - - Not Complete  HRI or Home Care Consult comments - - patient declines  Social Work Scientific laboratory technician for [place] Planning/Counseling - - Complete  Palliative Care Screening - - Not Applicable  Medication Review Press photographer) Complete Complete Complete  PCP or Specialist appointment within 3-5 days of discharge Not Complete Complete -  [place] or Home Care Consult Complete Not Complete -  [place] or Home Care Consult Pt Refusal Comments - not ready -  SW Recovery Care/Counseling Consult Complete Complete -  Palliative Care Screening Not Applicable Not Applicable -  [place] Not Complete Not Complete -  SNF Comments - 24/7 care -  Some recent data might be hidden

## 2019-07-02 NOTE — Evaluation (Signed)
Physical Therapy Evaluation Patient Details Name: Scott [name] MRN: [id number] DOB: [birth year] Today's Date: 07/02/2019   History of Present Illness  Scott [name] is a 86 y.o. male with medical history significant of DNR/DNI, hx of a fib on coumadin, CAD s/p MI 2019, left BKA, diabetes, presented to the hospital with fever, lethargy, and shortness of breath.  His caretaker at the bedside reports that for the past 3 days or so the patient has been complaining about shortness of breath.  They have noted that his home O2 sat has been dropping as low as 72% on room air.  Normally the patient is 93 to 94% on room air.  They feel like he was improving with albuterol treatments at home, but today he maintained sats in the 80s.  Patient never complained about chest pain.  They noted that he might be febrile today.  Patient himself appears extremely tired on exam and cannot provide me any other history.    Clinical Impression  Patient presents slightly lethargic and able to participate with bed mobility activities.  Patient demonstrates slow labored movement for sitting up at bedside, good trunk control once seated and able to laterally scoot with right foot blocked at bedside, transfers not test due to patient fatigued and slightly lethargic. Patient put back to bed with caregiver in room.  Patient will benefit from continued physical therapy in hospital and recommended venue below to increase strength, balance, endurance for safe ADLs and gait.    Follow Up Recommendations Home health PT;Supervision for mobility/OOB;Supervision/Assistance - 24 hour    Equipment Recommendations  None recommended by PT    Recommendations for Other Services       Precautions / Restrictions Precautions Precautions: Fall Restrictions Weight Bearing Restrictions: No      Mobility  Bed Mobility Overal bed mobility: Needs Assistance Bed Mobility: Supine to Sit;Sit to Supine     Supine to sit: Min assist;Mod  assist Sit to supine: Min assist;Mod assist   General bed mobility comments: slow labored movement  Transfers                    Ambulation/Gait                Stairs            Wheelchair Mobility    Modified Rankin (Stroke Patients Only)       Balance Overall balance assessment: Needs assistance Sitting-balance support: Feet supported;No upper extremity supported Sitting balance-Leahy Scale: Fair Sitting balance - Comments: fair/good seated at EOB                                     Pertinent Vitals/Pain Pain Assessment: No/denies pain    Home Living Family/patient expects to be discharged to:: Private residence Living Arrangements: Alone Available Help at Discharge: Personal care attendant;Available 24 hours/day Type of Home: House Home Access: Ramped entrance     Home Layout: One level Home Equipment: Wheelchair - power;Walker - 2 wheels;Shower seat;Shower seat - built in;Bedside commode      Prior Function Level of Independence: Needs assistance   Gait / Transfers Assistance Needed: Non-ambulatory, uses power wheelchair for mobility, Mod Indep stand pivot transfers  ADL's / Homemaking Assistance Needed: assisted for bathing, dressing and household ADLs by paid attendants 24/7        Hand Dominance   Dominant Hand: Right  Extremity/Trunk Assessment   Upper Extremity Assessment Upper Extremity Assessment: Generalized weakness    Lower Extremity Assessment Lower Extremity Assessment: Generalized weakness    Cervical / Trunk Assessment Cervical / Trunk Assessment: Kyphotic  Communication   Communication: HOH  Cognition Arousal/Alertness: Awake/alert Behavior During Therapy: WFL for tasks assessed/performed Overall Cognitive Status: Within Functional Limits for tasks assessed                                        General Comments      Exercises     Assessment/Plan    PT Assessment  Patient needs continued PT services  PT Problem List Decreased strength;Decreased activity tolerance;Decreased balance;Decreased mobility       PT Treatment Interventions Balance training;Functional mobility training;Therapeutic activities;Patient/family education;Wheelchair mobility training    PT Goals (Current goals can be found in the Care Plan section)  Acute Rehab PT Goals Patient Stated Goal: return home with caregivers to assist PT Goal Formulation: With patient Time For Goal Achievement: 07/07/19 Potential to Achieve Goals: Good    Frequency Min 3X/week   Barriers to discharge        Co-evaluation               AM-PAC PT "6 Clicks" Mobility  Outcome Measure Help needed turning from your back to your side while in a flat bed without using bedrails?: A Little Help needed moving from lying on your back to sitting on the side of a flat bed without using bedrails?: A Little Help needed moving to and from a bed to a chair (including a wheelchair)?: A Lot Help needed standing up from a chair using your arms (e.g., wheelchair or bedside chair)?: A Lot Help needed to walk in hospital room?: Total Help needed climbing 3-5 steps with a railing? : Total 6 Click Score: 12    End of Session   Activity Tolerance: Patient tolerated treatment well;Patient limited by fatigue Patient left: in bed;with call bell/phone within reach;with family/visitor present Nurse Communication: Mobility status PT Visit Diagnosis: Unsteadiness on feet (R26.81);Muscle weakness (generalized) (M62.81);History of falling (Z91.81);Difficulty in walking, not elsewhere classified (R26.2)    Time: [id number] PT Time Calculation (min) (ACUTE ONLY): 26 min   Charges:   PT Evaluation $PT Eval Moderate Complexity: 1 Mod PT Treatments $Therapeutic Activity: 23-37 mins        2:57 PM, 07/02/19 Lonell Grandchild, MPT Physical Therapist with [facility] [phone] office [phone]  mobile phone

## 2019-07-02 NOTE — Consult Note (Addendum)
[facility] TeleNeurology Consult Services   TeleStroke Metrics: LKW: [phone] Door Time: N/A  [facility] Contacted: [phone]  [facility] at Bedside: [phone] NIHSS: 1849 Decision on Alteplase: Not a candidate as the patient is currently on Coumadin with appropriate INR of 2.1 and PT 23.7 this morning. Interventional Candidate: Likely not a candidate as his symptoms are not consistent with a large vessel proximal occlusion.  CTA head and neck are pending.   Chief Complaint: Altered mental status   HPI: Asked to see this patient in emergent telemedicine consultation utilizing interactive audio and video technologies. Consultation was performed with assistance of ancillary / medical staff at bedside. Verbal consent to perform the examination with telemedicine was obtained. Patient's nephew agreed to proceed with the consultation for acute stroke protocol.  86 year old African-American male who was admitted to the hospital yesterday for acute CHF, fevers, and possible sepsis.  Patient's nephew is at bedside to assist with history.  Since the patient's admission, he still continues to have intermittent fevers requiring Tylenol.  He did have a fever this afternoon at 100.59F.  Chest x-ray and Covid test are thus far negative.  Blood cultures are negative x2.  Primary team suspects potential urinary source.  Patient is on Coumadin for history of atrial fibrillation and PE/DVT.  His INR has been therapeutic.  Echocardiogram completed today was stable.  Patient had also been started on IV antibiotics.  According to the nephew, he spoke to the patient at 4:45 PM and he was responsive and talking.  By 6 PM, the patient was noted to be unresponsive and nonverbal.  He has diffuse weakness and hardly withdraws to pain.  Head CT performed was negative.   PMH: Peripheral arterial disease status post left AKA; paroxysmal atrial fibrillation on Coumadin; hypertension; hyperlipidemia; coronary artery disease;  dementia NOS; COPD; history of DVT/PE; coronary artery disease with prior MI; glaucoma; depression; bladder cancer; osteoarthritis; chronic kidney disease; and chronic combined congestive heart failure   SOC: Negative x3.  Patient lives in his own home, but has several family members that checks up on him every day.  He has a power wheelchair and can dress himself and transfer.   [place]: Significant for osteoarthritis, lung cancer, DVT, and hypertension.   ROS: 13 point review systems were reviewed with the patient's nephew, and are all negative with the exception of the aforementioned in the history of present illness.   VS: Temperature 98.2 F, pulse 83, respiration 18, blood pressure 116/67, oxygen saturation 1%, weight 67.1 kg   Exam: Patient is in no apparent distress.  Patient appears as stated age.  No obvious acute respiratory or cardiac distress.  Patient is well groomed and well-nourished. 1a- LOC: Not keenly responsive - 2 1b- LOC questions: Answers neither questions correctly - 2 1c- LOC commands- Performs neither tasks correctly- 2 2- Gaze: Normal; no gaze paresis or gaze deviation - 0 3- Visual Fields: normal, no Visual field deficit - 0 4- Facial movements: left facial palsy - 1 5- Upper limb motor - bilateral arm drift - 8 6- Lower limb motor - bilateral leg drift - 8 7- Limb Coordination: absent ataxia - 0 8- Sensory: Severe sensory loss - 2 9- Language - Global aphasia - 2 10- Speech - Severe dysarthria - 2 11- Neglect / Extinction - none found - 0 NIHSS score: 29   Diagnostic Data: CT head showed no acute intracranial hemorrhage, mass, or large territory stroke  Chest x-ray showed no acute cardiopulmonary disease  Echocardiogram showed ejection  fraction of 35 to 40%, positive LV segmental wall motion abnormalities, left atrium was mildly dilated, and mild mitral regurgitation  Blood glucose 137 Sodium 144, potassium 4.4, BUN 54, creatinine 2.26, magnesium  2.4 Cardiac BNP 1193 WBC 11.3, hemoglobin 12.3, platelets 120, PT 23.7, INR 2.1 Procalcitonin level 2.07, lactic acid 1.4, Covid test negative, blood cultures thus far negative x2 sets  Medical Data Reviewed: 1.Data?reviewed include clinical labs, radiology,?and medical tests; 2.Tests?results discussed w/performing or interpreting physician; 3.Obtaining/reviewing old medical records; 4.Obtaining?case history from another source; 5.Independent?review of image, tracing, or specimen.   Medical Decision Making: - Extensive number of diagnosis or management options are considered below. - Extensive amount of complex data reviewed. - High risk of complication and/or morbidity or mortality are associated with differential diagnostic considerations below. - There may be?uncertain?outcome and increased probability of prolonged functional impairment or high probability of severe prolonged functional impairment associated with some of these differential diagnosis.   Differential Diagnosis for Stroke: 1.?Cardioembolic?stroke 2. Small vessel disease/lacune 3. Thromboembolic, artery-to-artery mechanism 4.?Hypercoagulable?state-related infarct 5. Transient ischemic attack 6. Thrombotic mechanism, large artery disease   Assessment: 1.  Altered mental status 2.  Fever with possible sepsis 3.  Acute on chronic combined congestive heart failure with ejection fraction of 35 to 40% 4.  Peripheral arterial disease status post left AKA 5.  Atrial fibrillation on Coumadin 6.  History of PE/DVT on Coumadin 7.  Hypertension 8.  Hyperlipidemia 9.  Coronary artery disease 10.  COPD 62.  Dementia NOS 12.  History of bladder cancer 13.  Chronic kidney disease  Recommendations: Further metabolic and infectious work-up per primary team Consult inpatient neurology team to assist with the evaluation and management EEG has already been ordered by the primary team We will go ahead and check a stat CTA head  and neck to better evaluate his intracranial and extracranial blood vessels, with particular attention to the posterior circulation Check MRI brain without contrast to rule out any acute intracranial process Maintain the patient on telemetry to monitor his atrial fibrillation Check TSH, B12 level, and ammonia level Consult PT, OT, and ST when the patient is more stable Continue Coumadin for now for goal INR 2-3 If no improvement within the next 24 hours and he has negative imaging studies, to consider a formal lumbar puncture for CSF analysis to rule out any underlying infectious or inflammatory process. Continue supportive care Plan of care was discussed with the patient's family  Thank you for allowing [facility] to participate in the care of your patient. Please call me, Dr. Dale Camp Three, with any questions at [phone]. Case discussed with the RN staff.  Attempts to reach the primary attending, Dr. Denton Brick were unsuccessful.  I left a voice message for him to call me back if he has any further questions or concerns.   Critical Care notation:   I was called to see this critical patient emergently. I personally evaluated this critical patient for acute stroke evaluation, and determining their eligibility for IV Alteplase and interventional therapies.  I have spent approximately 14 minutes with the patient, including time at bedside, time discussing the case with other physicians, reviewing plan of care, and time independently reviewing the records and scans.  Addendum on 07/02/2019 at 1947: CTA of the head showed patent proximal bilateral MCA vessels and basilar artery.  Formal report is pending.  Patient is therefore not an interventional candidate.

## 2019-07-02 NOTE — Progress Notes (Signed)
*  PRELIMINARY RESULTS* Echocardiogram 2D Echocardiogram has been performed.  Leavy Cella 07/02/2019, 12:19 PM

## 2019-07-03 ENCOUNTER — Other Ambulatory Visit: Payer: Self-pay | Admitting: Family Medicine

## 2019-07-03 ENCOUNTER — Inpatient Hospital Stay (HOSPITAL_COMMUNITY): Payer: Medicare Other

## 2019-07-03 ENCOUNTER — Inpatient Hospital Stay (HOSPITAL_COMMUNITY)
Admit: 2019-07-03 | Discharge: 2019-07-03 | Disposition: A | Discharge status: Home / Self Care | Payer: Medicare Other | Attending: Family Medicine | Admitting: Family Medicine

## 2019-07-03 ENCOUNTER — Encounter (HOSPITAL_COMMUNITY): Payer: Self-pay | Admitting: Internal Medicine

## 2019-07-03 DIAGNOSIS — R4182 Altered mental status, unspecified: Secondary | ICD-10-CM | POA: Diagnosis not present

## 2019-07-03 DIAGNOSIS — Z515 Encounter for palliative care: Secondary | ICD-10-CM

## 2019-07-03 DIAGNOSIS — R9401 Abnormal electroencephalogram [EEG]: Secondary | ICD-10-CM

## 2019-07-03 DIAGNOSIS — Z7189 Other specified counseling: Secondary | ICD-10-CM

## 2019-07-03 DIAGNOSIS — R404 Transient alteration of awareness: Secondary | ICD-10-CM

## 2019-07-03 LAB — URINE CULTURE: Culture: NO GROWTH

## 2019-07-03 LAB — BASIC METABOLIC PANEL
Anion gap: 9 (ref 5–15)
BUN: 55 mg/dL — ABNORMAL HIGH (ref 8–23)
CO2: 26 mmol/L (ref 22–32)
Calcium: 8.8 mg/dL — ABNORMAL LOW (ref 8.9–10.3)
Chloride: 108 mmol/L (ref 98–111)
Creatinine, Ser: 1.94 mg/dL — ABNORMAL HIGH (ref 0.61–1.24)
GFR calc Af Amer: 33 mL/min — ABNORMAL LOW (ref >60)
GFR calc non Af Amer: 29 mL/min — ABNORMAL LOW (ref >60)
Glucose, Bld: 142 mg/dL — ABNORMAL HIGH (ref 70–99)
Potassium: 4.3 mmol/L (ref 3.5–5.1)
Sodium: 143 mmol/L (ref 135–145)

## 2019-07-03 LAB — CBC
HCT: 37.9 % — ABNORMAL LOW (ref 39.0–52.0)
Hemoglobin: 11.7 g/dL — ABNORMAL LOW (ref 13.0–17.0)
MCH: 27.5 pg (ref 26.0–34.0)
MCHC: 30.9 g/dL (ref 30.0–36.0)
MCV: 89.2 fL (ref 80.0–100.0)
Platelets: 119 10*3/uL — ABNORMAL LOW (ref 150–400)
RBC: 4.25 MIL/uL (ref 4.22–5.81)
RDW: 15.7 % — ABNORMAL HIGH (ref 11.5–15.5)
WBC: 10.9 10*3/uL — ABNORMAL HIGH (ref 4.0–10.5)
nRBC: 0 % (ref 0.0–0.2)

## 2019-07-03 LAB — MRSA PCR SCREENING: MRSA by PCR: NEGATIVE

## 2019-07-03 LAB — PROTIME-INR
INR: 2.9 — ABNORMAL HIGH (ref 0.8–1.2)
Prothrombin Time: 30 seconds — ABNORMAL HIGH (ref 11.4–15.2)

## 2019-07-03 LAB — AMMONIA: Ammonia: 12 umol/L (ref 9–35)

## 2019-07-03 MED ORDER — LEVETIRACETAM IN NACL 1000 MG/100ML IV SOLN
1000.0000 mg | Freq: Once | INTRAVENOUS | Status: AC
  Administered 2019-07-03: 1000 mg via INTRAVENOUS
  Filled 2019-07-03: qty 100

## 2019-07-03 MED ORDER — LORAZEPAM 2 MG/ML IJ SOLN
0.5000 mg | Freq: Four times a day (QID) | INTRAMUSCULAR | Status: DC | PRN
  Administered 2019-07-03 – 2019-07-05 (×2): 0.5 mg via INTRAVENOUS
  Filled 2019-07-03 (×3): qty 1

## 2019-07-03 NOTE — Progress Notes (Signed)
Patient's family member requesting softer food for patient. States they cook the food soft for him at home and that he doesn't have a lot of teeth so the food here is harder for him to eat. Modified patient's diet to be mechanical soft.

## 2019-07-03 NOTE — Progress Notes (Signed)
PT Cancellation Note  Patient Details Name: Scott [name] MRN: [id number] DOB: 1921/07/04   Cancelled Treatment:    Reason Eval/Treat Not Completed: Medical issues which prohibited therapy.  Patient transferred to a higher level of care and will need new PT consult resume therapy when patient is medically stable.  Thank you.    8:08 AM, 07/03/19 Lonell Grandchild, MPT Physical Therapist with [facility] [phone] office [phone] mobile phone

## 2019-07-03 NOTE — Progress Notes (Signed)
CT Code Stroke times 1808 call time 1815 beeper time 1820 exam started 1821 exam finished 1821 Images sent to [place] exam completed in [facility] radiology called

## 2019-07-03 NOTE — Procedures (Signed)
Patient Name: WILFERD KAWALEC  MRN: [id number]  Epilepsy Attending: Lora Havens  Referring Physician/Provider: Dr Roxan Hockey Date: 07/03/2019 Duration: 24.14 mins  Patient history: 86 year old male with transient alteration of awareness.  EEG evaluate for seizures.  Level of alertness: Awake, asleep  AEDs during EEG study: Gabapentin  Technical aspects: This EEG study was done with scalp electrodes positioned according to the 10-20 International system of electrode placement. Electrical activity was acquired at a sampling rate of 500Hz  and reviewed with a high frequency filter of 70Hz  and a low frequency filter of 1Hz . EEG data were recorded continuously and digitally stored.   Description: No clear posterior dominant rhythm was seen. Sleep was characterized by vertex waves, maximal frontocentral region.  EEG showed continuous generalized 6-8 Hz theta-alpha activity.     Hyperventilation and photic stimulation were not performed.    Abnormality - Continuous slow, generalized  IMPRESSION: This study is suggestive of mild to moderate diffuse encephalopathy. No seizures or epileptiform discharges were seen throughout the recording.  Zayaan Kozak Barbra Sarks

## 2019-07-03 NOTE — Progress Notes (Signed)
PT Cancellation Note  Patient Details Name: Scott [name] MRN: [id number] DOB: 1921-10-21   Cancelled Treatment:    Reason Eval/Treat Not Completed: Patient at procedure or test/unavailable.  Patient not available due receiving EEG.  Will check back tomorrow.    3:34 PM, 07/03/19 Lonell Grandchild, MPT Physical Therapist with [facility] [phone] office [phone] mobile phone

## 2019-07-03 NOTE — Progress Notes (Signed)
ANTICOAGULATION CONSULT NOTE -   Pharmacy Consult for warfarin dosing  Indication: atrial fibrillation   Allergies  Allergen Reactions  . Lipitor [Atorvastatin] Other (See Comments)    Unknown rxn per pt      Patient Measurements: Last Weight  Most recent update: 07/03/2019  6:18 AM   Weight  67.5 kg (148 lb 13 oz)           Body mass index is 24.76 kg/m. Scott [name]               Temp: 98.2 F (36.8 C) (03/23 0600) Temp Source: Oral (03/23 0600) BP: 107/78 (03/23 0500) Pulse Rate: 72 (03/23 0500)  Labs: Recent Labs    07/01/19 1452 07/01/19 1452 07/02/19 0443 07/02/19 0443 07/02/19 1247 07/03/19 0523  HGB 13.7   < > 13.0   < > 12.3* 11.7*  HCT 43.5   < > 41.7  --  39.5 37.9*  PLT 158   < > 131*  --  120* 119*  APTT 39*  --   --   --   --   --   LABPROT 22.4*  --  23.7*  --   --  30.0*  INR 2.0*  --  2.1*  --   --  2.9*  CREATININE 2.50*   < > 2.22*  --  2.26* 1.94*   < > = values in this interval not displayed.    Estimated Creatinine Clearance: 19.8 mL/min (A) (by C-G formula based on SCr of 1.94 mg/dL (H)).     Medications:  Medications Prior to Admission  Medication Sig Dispense Refill Last Dose  . acetaminophen (TYLENOL) 325 MG tablet Take 2 tablets (650 mg total) by mouth every 6 (six) hours as needed for mild pain (or Fever >/= 101).   06/30/2019 at 1900  . albuterol (PROVENTIL) (2.5 MG/3ML) 0.083% nebulizer solution NEBULIZE 1 VIAL EVERY 6 HOURS AS NEEDED FOR WHEEZING OR SHORTNESS OF BREATH (Patient taking differently: Take 2.5 mg by nebulization every 6 (six) hours as needed for wheezing or shortness of breath. ) 180 mL 3 07/01/2019 at Unknown time  . albuterol (VENTOLIN HFA) 108 (90 Base) MCG/ACT inhaler Inhale 2 puffs into the lungs every 6 (six) hours as needed for wheezing or shortness of breath. 18 g 3 07/01/2019 at Unknown time  . aspirin EC 81 MG EC tablet Take 1 tablet (81 mg total) by mouth daily.   06/30/2019 at 0930  . BIDIL 20-37.5 MG  tablet TAKE 1 TABLET BY MOUTH 3 TIMES DAILY. 90 tablet 0 06/30/2019 at Unknown time  . carvedilol (COREG) 6.25 MG tablet TAKE  (1)  TABLET TWICE A DAY. (Patient taking differently: Take 6.25 mg by mouth 2 (two) times daily with a meal. ) 60 tablet 0 06/30/2019 at 1900  . COMBIVENT RESPIMAT 20-100 MCG/ACT AERS respimat 1 PUFF EVERY 4 HOURS AS NEEDED FOR COUGH, WHEEZING, OR SHORTNESS OF BREATH 4 g 0 06/30/2019 at Unknown time  . donepezil (ARICEPT) 10 MG tablet TAKE ONE TABLET AT BEDTIME 30 tablet 0 06/30/2019 at Unknown time  . febuxostat (ULORIC) 40 MG tablet TAKE 1 TABLET DAILY 30 tablet 0 06/30/2019 at Unknown time  . fluticasone (FLONASE) 50 MCG/ACT nasal spray Place 1 spray into both nostrils daily as needed for allergies or rhinitis. (Patient taking differently: Place 1 spray into both nostrils at bedtime. ) 16 g 2 06/30/2019 at Unknown time  . folic acid (FOLVITE) 1 MG tablet TAKE 1 TABLET DAILY 30  tablet 2 06/30/2019 at Unknown time  . furosemide (LASIX) 40 MG tablet TAKE 1 TABLET 2 TIMES A DAY 60 tablet 0 06/30/2019 at Unknown time  . gabapentin (NEURONTIN) 100 MG capsule TAKE 2 CAPSULES TWICE A DAY 120 capsule 0 06/30/2019 at Unknown time  . HYDROcodone-acetaminophen (NORCO/VICODIN) 5-325 MG tablet Take 1 tablet by mouth 2 (two) times daily as needed for moderate pain. 60 tablet 0 Past Month at Unknown time  . ipratropium (ATROVENT) 0.02 % nebulizer solution Take 0.5 mg by nebulization every 6 (six) hours as needed for wheezing or shortness of breath.   06/30/2019 at Unknown time  . MYRBETRIQ 25 MG TB24 tablet Take 25 mg by mouth every morning.    06/30/2019 at Unknown time  . pantoprazole (PROTONIX) 40 MG tablet TAKE 1 TABLET DAILY 90 tablet 0 06/30/2019 at Unknown time  . sertraline (ZOLOFT) 50 MG tablet TAKE 1 TABLET IN THE MORNING 30 tablet 2 06/30/2019 at Unknown time  . simvastatin (ZOCOR) 10 MG tablet TAKE 1 TABLET ONCE DAILY IN THE EVENING (Patient taking differently: Take 10 mg by mouth daily at 6  PM. ) 90 tablet 0 06/30/2019 at Unknown time  . warfarin (COUMADIN) 2.5 MG tablet Take 2.5 mg by mouth daily except Mondays and Fridays take 5 mg 40 tablet 2 06/30/2019 at 0930   Scheduled:  . aspirin EC  81 mg Oral Daily  . azithromycin  250 mg Oral Daily  . carvedilol  6.25 mg Oral BID WC  . Chlorhexidine Gluconate Cloth  6 each Topical Daily  . donepezil  10 mg Oral QHS  . febuxostat  40 mg Oral Daily  . fluticasone  1 spray Each Nare QHS  . folic acid  1 mg Oral Daily  . furosemide  40 mg Intravenous BID  . gabapentin  200 mg Oral BID  . guaiFENesin  600 mg Oral BID  . isosorbide dinitrate  20 mg Oral TID   And  . hydrALAZINE  37.5 mg Oral TID  . mouth rinse  15 mL Mouth Rinse BID  . methylPREDNISolone (SOLU-MEDROL) injection  40 mg Intravenous Q12H  . mirabegron ER  25 mg Oral q morning - 10a  . pantoprazole  40 mg Oral Daily  . potassium chloride  10 mEq Oral BID  . sertraline  50 mg Oral q morning - 10a  . simvastatin  10 mg Oral q1800  . sodium chloride flush  3 mL Intravenous Q12H  . warfarin  2.5 mg Oral Once per day on Sun Tue Wed Thu Sat  . warfarin  5 mg Oral Once per day on Mon Fri  . Warfarin - Pharmacist Dosing Inpatient   Does not apply q1600   Infusions:  . sodium chloride    . sodium chloride 100 mL/hr at 07/03/19 0542  . cefTRIAXone (ROCEPHIN)  IV Stopped (07/02/19 1552)   PRN: sodium chloride, acetaminophen, HYDROcodone-acetaminophen, ipratropium-albuterol, ondansetron (ZOFRAN) IV, sodium chloride flush Anti-infectives (From admission, onward)   Start     Dose/Rate Route Frequency Ordered Stop   07/02/19 1600  azithromycin (ZITHROMAX) tablet 250 mg     250 mg Oral Daily 07/01/19 1812 07/06/19 1559   07/02/19 1500  cefTRIAXone (ROCEPHIN) 1 g in sodium chloride 0.9 % 100 mL IVPB     1 g 200 mL/hr over 30 Minutes Intravenous Every 24 hours 07/01/19 1812     07/01/19 1500  cefTRIAXone (ROCEPHIN) 1 g in sodium chloride 0.9 % 100 mL IVPB  1 g 200 mL/hr  over 30 Minutes Intravenous  Once 07/01/19 1452 07/01/19 1549   07/01/19 1500  azithromycin (ZITHROMAX) 500 mg in sodium chloride 0.9 % 250 mL IVPB  Status:  Discontinued     500 mg 250 mL/hr over 60 Minutes Intravenous Every 24 hours 07/01/19 1452 07/01/19 2048      Goal of Therapy:  INR 2-3 Monitor platelets by anticoagulation protocol: Yes    Prior to Admission Warfarin Dosing:  [name] Carberry takes 2.5mg  of warfarin everyday of the week except MF, takes 5mg      INR was 2.1 Lab Results  Component Value Date   INR 2.9 (H) 07/03/2019   INR 2.1 (H) 07/02/2019   INR 2.0 (H) 07/01/2019    Assessment: Scott [name] a 86 y.o. male requires anticoagulation with warfarin for the indication of  atrial fibrillation. Warfarin will be initiated inpatient following pharmacy protocol per pharmacy consult. Patient most recent blood work is as follows: CBC Latest Ref Rng & Units 07/03/2019 07/02/2019 07/02/2019  WBC 4.0 - 10.5 K/uL 10.9(H) 11.3(H) 12.5(H)  Hemoglobin 13.0 - 17.0 g/dL 11.7(L) 12.3(L) 13.0  Hematocrit 39.0 - 52.0 % 37.9(L) 39.5 41.7  Platelets 150 - 400 K/uL 119(L) 120(L) 131(L)     Plan: INR 2.1 > 2.9. Hold warfarin x 1 dose due to large increase. Monitor CBC MWF with am labs   Monitor INR daily Monitor for signs and symptoms of bleeding   Margot Ables, PharmD Clinical Pharmacist 07/03/2019 7:41 AM

## 2019-07-03 NOTE — Progress Notes (Signed)
Back to room from MRI. Awake, answers questions appropriately, follows all commands. Denies c/o pain. Pt's systolic B/P in low [id number]. MD notified due to pt scheduled to receive 3 separate medications that impact B/P. Will hold for further instructions from MD.

## 2019-07-03 NOTE — Progress Notes (Signed)
Pt down to MRI for brain scan. Pt awake, able to tell me his name, birthday and where he is. Recognizes son and nephew. Denies c/o.

## 2019-07-03 NOTE — Consult Note (Signed)
[place] A. Merlene Laughter, MD     [email or web address]          Scott [name] is an 86 y.o. male.   ASSESSMENT/PLAN: 1.  Fluctuating level of arousal possibly related to ceftriaxone, this has been discontinued.  The patient also could be having complex partial seizure but he does not have a history of this and EEG has been unrevealing.  He was given a loading dose of Keppra.  I do not think we need to continue with the maintenance dose however. 2.  Community-acquired pneumonia 3.  Remote left basal ganglia infarct 4.  Atrial fibrillation 5.  Cognitive impairment    This is a 86 year old black male who presented to the hospital with hypoxia  and acute shortness of breath wth oxygen saturation 72% on room air. He typically runs in the mid 90s. He was diagnosed as having  Community-acquired pneumonia.  He did have a low-grade fever. I believe he was given antibiotics. He developed fluctuating level of unresponsiveness at least 2 documented after being hospitalized for a day. EEG shows mild slowing otherwise unrevealing.  The nursing staff reports that the patient's grandson indicate that the fluctuating level of consciousness and unresponsiveness seems to correlate with the dosing of ceftriaxone.  The patient does not report any complaints at this time.  He denies any dyspnea or chest pain.  He cannot provide the history as to his medical wellbeing.  Review of systems is limited but otherwise unrevealing.    GENERAL: This is a thin somewhat frail-appearing male in no acute distress.  Slight wheezings are noted but he is not dyspneic or tachypneic.  He is on 2 L oxygen.  HEENT:  Neck is suppl, no trauma appreciated.  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  BACK: Normal  SKIN: Normal by inspection.    MENTAL STATUS: He lays in bed with eyes closed.  He does open his eyes to he lays in bed verbal commands and spontaneously.  He  follows commands well.  He knows that he is in the hospital but thinks he is in [place].  He states the year is 2021 but the month is May.  CRANIAL NERVES: The left eye is somewhat clouded with nonreactive pupil.  The right is mid position approximately 5 mm and reactive.  Extraocular movements are intact.  There is no significant nystagmus; visual fields are full; upper and lower facial muscles are normal in strength and symmetric, there is no flattening of the nasolabial folds; tongue is midline; uvula is midline; shoulder elevation is normal.  MOTOR: He has about 4/strength in the upper extremities.  Bulk and tone are normal.  He has antigravity strength in the legs.  COORDINATION: Left finger to nose is normal, right finger to nose is normal, No rest tremor; no intention tremor; no postural tremor; no bradykinesia.  REFLEXES: Deep tendon reflexes are symmetrical and normal.   SENSATION: Normal to pain.    Blood pressure 108/62, pulse 72, temperature 97.7 F (36.5 C), temperature source Oral, resp. rate 18, height 5\' 5"  (1.651 m), weight 67.5 kg, SpO2 94 %.  Past Medical History:  Diagnosis Date  . Arthritis    "all over"  . Atrial fibrillation ([place])   . Benign localized prostatic hyperplasia with lower urinary tract symptoms (LUTS)   . Bradycardia 11/28/2015   Severe-HR in the 20s to 30s following intubation for urological procedure.  Marland Kitchen CAD- non obstructive disease by cath 3/14  cardiologist-  dr Percival Spanish  . Chronic lower back pain   . COPD (chronic obstructive pulmonary disease) ([place])   . DDD (degenerative disc disease)   . Depression   . Dyspnea    w/ exertion and lying down (raise head)  . ETOH abuse   . GERD (gastroesophageal reflux disease)   . Glaucoma   . Glaucoma, both eyes   . History of bladder cancer urologist-- dr Jeffie Pollock   first dx 2012--  s/p TURBT's  . History of DVT of lower extremity 03/2013   bilateral   . History of pulmonary embolus (PE) 04/2013  . HTN  (hypertension)   . Hyperlipidemia   . Hypertension   . LBBB (left bundle branch block)    chronic  . Non-ischemic cardiomyopathy ([place])    last echo 01-18-2017, ef 35-40%  . NSVT (nonsustained ventricular tachycardia) ([facility])    a. NSVT 06/2012; NSVT also seen during 03/2013 adm. b. Med rx. Not candidate for ICD given adv age.  Marland Kitchen PAD (peripheral artery disease) ([place])   . 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 PVCs (premature ventricular contractions)   . PVD (peripheral vascular disease) (HCC)    Right ABI .75, Left .78 (2006)  . Syncope 06/2012   a. Felt to be postural syncope related to diuretics 06/2012.  Marland Kitchen Systolic and diastolic CHF, chronic (HCC) cardiologsit-  dr hochrein   a. NICM - patent cors 06/2012, EF 40% at that time. b. 03/2013 eval: EF 20-25%.  . Vision loss, left eye    due to glaucoma  . Wears glasses     Past Surgical History:  Procedure Laterality Date  . AMPUTATION Left 10/16/2014   Procedure: AMPUTATION ABOVE KNEE- LEFT;  Surgeon: Mal Misty, MD;  Location: [place];  Service: Vascular;  Laterality: Left;  . CARDIAC CATHETERIZATION  05-11-2004  DR [facility]   MINIMAL CORANARY PLAQUE/ NORMAL LVF/ EF 55%/  NON-OBSTRUCTIVE LAD 25%  . CARDIAC CATHETERIZATION  06/18/2012   dr hochrein   mild luminal irregularities of coronaries, ef 45-50%  . CARDIOVASCULAR STRESS TEST  10-15-2010   LOW RISK NUCLEAR STUDY/ NO EVIDENCE OF ISCHEMIA/ NORMAL EF  . CATARACT EXTRACTION W/ INTRAOCULAR LENS  IMPLANT, BILATERAL Bilateral   . CYSTOSCOPY  10/07/2011   Procedure: CYSTOSCOPY;  Surgeon: Malka So, MD;  Location: [facility];  Service: Urology;  Laterality: N/A;  . CYSTOSCOPY N/A 10/20/2018   Procedure: CYSTOSCOPY WITH FULGURATION OF PROSTATIC URETHRAL TUMOR;  Surgeon: [name], MD;  Location[facility];  Service: Urology;  Laterality: N/A;  . CYSTOSCOPY WITH BIOPSY  03/14/2012    Procedure: CYSTOSCOPY WITH BIOPSY;  Surgeon: Malka So, MD;  Location: [facility];  Service: Urology;  Laterality: N/A;  WITH FULGURATION  . CYSTOSCOPY WITH BIOPSY N/A 12/09/2016   Procedure: CYSTOSCOPY WITH BIOPSY AND FULGURATION;  Surgeon: [name], MD;  Location[facility];  Service: Urology;  Laterality: N/A;  . CYSTOSCOPY WITH BIOPSY N/A 12/20/2017   Procedure: CYSTOSCOPY WITH BIOPSY WITH FULGURATION;  Surgeon: [name], MD;  [facility];  Service: Urology;  Laterality: N/A;  . CYSTOSCOPY WITH BIOPSY N/A 06/09/2018   Procedure: CYSTOSCOPY WITH BIOPSY AND FULGURATION;  Surgeon: [name], MD;  Location: [facility];  Service: Urology;  Laterality: N/A;  . CYSTOSCOPY WITH FULGERATION N/A 01/20/2016   Procedure: CYSTOSCOPY, BIOPSY WITH FULGERATION OF URTHRAL TUMOR;  Surgeon: [name], MD;  [facility];  Service: Urology;  Laterality: N/A;  . CYSTOSCOPY WITH FULGERATION N/A 06/01/2016   Procedure: CYSTOSCOPY WITH FULGERATION urethral tumors and bladder neck;  Surgeon: [name], MD;  [facility];  Service: Urology;  Laterality: N/A;  . SHOULDER SURGERY Left 1970's  . TONSILLECTOMY    . TRANSTHORACIC ECHOCARDIOGRAM  01-18-2017   dr hochrein   moderate LVH, ef 35-40%, diffuse hypokinesis/  trivial AR and MR/  mild TR  . TRANSURETHRAL RESECTION OF BLADDER TUMOR  10/07/2011   Procedure: TRANSURETHRAL RESECTION OF BLADDER TUMOR (TURBT);  Surgeon: Malka So, MD;  Location: [facility];  Service: Urology;  Laterality: N/A;  . TRANSURETHRAL RESECTION OF BLADDER TUMOR N/A 07/10/2015   Procedure: TRANSURETHRAL RESECTION OF BLADDER TUMOR (TURBT), CYSTOSCOPY ;  Surgeon: [name], MD;  [facility];  Service: Urology;  Laterality: N/A;  . TRANSURETHRAL RESECTION OF PROSTATE  03/14/2012   Procedure: TRANSURETHRAL RESECTION OF THE PROSTATE WITH GYRUS INSTRUMENTS;  Surgeon: Malka So, MD;  [facility];  Service: Urology;  Laterality: N/A;    Family History    Problem Relation Age of Onset  . Hypertension Mother   . Arthritis Mother   . Lung cancer Sister   . Deep vein thrombosis Father   . Early death Brother     Social History:  reports that he quit smoking about 44 years ago. His smoking use included cigarettes. He started smoking about 77 years ago. He has a 35.00 pack-year smoking history. He has never used smokeless tobacco. He reports previous alcohol use of about 7.0 standard drinks of alcohol per week. He reports that he does not use drugs.  Allergies:  Allergies  Allergen Reactions  . Lipitor [Atorvastatin] Other (See Comments)    Unknown rxn per pt    Medications: Prior to Admission medications   Medication Sig Start Date End Date Taking? Authorizing Provider  acetaminophen (TYLENOL) 325 MG tablet Take 2 tablets (650 mg total) by mouth every 6 (six) hours as needed for mild pain (or Fever >/= 101). 12/26/18  Yes Elgergawy, Silver Huguenin, MD  albuterol (PROVENTIL) (2.5 MG/3ML) 0.083% nebulizer solution NEBULIZE 1 VIAL EVERY 6 HOURS AS NEEDED FOR WHEEZING OR SHORTNESS OF BREATH Patient taking differently: Take 2.5 mg by nebulization every 6 (six) hours as needed for wheezing or shortness of breath.  09/07/18  Yes Dettinger, Fransisca Kaufmann, MD  albuterol (VENTOLIN HFA) 108 (90 Base) MCG/ACT inhaler Inhale 2 puffs into the lungs every 6 (six) hours as needed for wheezing or shortness of breath. 05/17/19  Yes Dettinger, Fransisca Kaufmann, MD  aspirin EC 81 MG EC tablet Take 1 tablet (81 mg total) by mouth daily. 12/12/18  Yes Tat, [name], MD  carvedilol (COREG) 6.25 MG tablet TAKE  (1)  TABLET TWICE A DAY. Patient taking differently: Take 6.25 mg by mouth 2 (two) times daily with a meal.  06/22/19  Yes Dettinger, Fransisca Kaufmann, MD  COMBIVENT RESPIMAT 20-100 MCG/ACT AERS respimat 1 PUFF EVERY 4 HOURS AS NEEDED FOR COUGH, WHEEZING, OR SHORTNESS OF BREATH 06/28/19  Yes Dettinger, Fransisca Kaufmann, MD  donepezil (ARICEPT) 10 MG tablet TAKE ONE TABLET AT BEDTIME 04/26/19  Yes  Dettinger, Fransisca Kaufmann, MD  febuxostat (ULORIC) 40 MG tablet TAKE 1 TABLET DAILY 04/26/19  Yes Dettinger, Fransisca Kaufmann, MD  fluticasone (FLONASE) 50 MCG/ACT nasal spray Place 1 spray into both nostrils daily as needed for allergies or rhinitis. Patient taking differently: Place 1 spray into both nostrils at  bedtime.  09/07/18  Yes Dettinger, Fransisca Kaufmann, MD  folic acid (FOLVITE) 1 MG tablet TAKE 1 TABLET DAILY 05/24/19  Yes Dettinger, Fransisca Kaufmann, MD  furosemide (LASIX) 40 MG tablet TAKE 1 TABLET 2 TIMES A DAY 06/28/19  Yes Dettinger, Fransisca Kaufmann, MD  HYDROcodone-acetaminophen (NORCO/VICODIN) 5-325 MG tablet Take 1 tablet by mouth 2 (two) times daily as needed for moderate pain. 01/30/19  Yes Dettinger, Fransisca Kaufmann, MD  ipratropium (ATROVENT) 0.02 % nebulizer solution Take 0.5 mg by nebulization every 6 (six) hours as needed for wheezing or shortness of breath.   Yes [provider]  MYRBETRIQ 25 MG TB24 tablet Take 25 mg by mouth every morning.  09/20/16  Yes [provider]  pantoprazole (PROTONIX) 40 MG tablet TAKE 1 TABLET DAILY 05/16/19  Yes Dettinger, Fransisca Kaufmann, MD  sertraline (ZOLOFT) 50 MG tablet TAKE 1 TABLET IN THE MORNING 05/24/19  Yes Dettinger, Fransisca Kaufmann, MD  simvastatin (ZOCOR) 10 MG tablet TAKE 1 TABLET ONCE DAILY IN THE EVENING Patient taking differently: Take 10 mg by mouth daily at 6 PM.  05/15/19  Yes Dettinger, Fransisca Kaufmann, MD  warfarin (COUMADIN) 2.5 MG tablet Take 2.5 mg by mouth daily except Mondays and Fridays take 5 mg 05/24/19  Yes Dettinger, Fransisca Kaufmann, MD  BIDIL 20-37.5 MG tablet TAKE 1 TABLET BY MOUTH 3 TIMES DAILY. 07/03/19   Dettinger, Fransisca Kaufmann, MD  gabapentin (NEURONTIN) 100 MG capsule TAKE 2 CAPSULES TWICE A DAY 07/03/19   Dettinger, Fransisca Kaufmann, MD    Scheduled Meds: . aspirin EC  81 mg Oral Daily  . azithromycin  250 mg Oral Daily  . carvedilol  6.25 mg Oral BID WC  . Chlorhexidine Gluconate Cloth  6 each Topical Daily  . donepezil  10 mg Oral QHS  . febuxostat  40 mg Oral Daily    . fluticasone  1 spray Each Nare QHS  . folic acid  1 mg Oral Daily  . furosemide  40 mg Intravenous BID  . gabapentin  200 mg Oral BID  . guaiFENesin  600 mg Oral BID  . isosorbide dinitrate  20 mg Oral TID   And  . hydrALAZINE  37.5 mg Oral TID  . mouth rinse  15 mL Mouth Rinse BID  . methylPREDNISolone (SOLU-MEDROL) injection  40 mg Intravenous Q12H  . mirabegron ER  25 mg Oral q morning - 10a  . pantoprazole  40 mg Oral Daily  . potassium chloride  10 mEq Oral BID  . sertraline  50 mg Oral q morning - 10a  . simvastatin  10 mg Oral q1800  . sodium chloride flush  3 mL Intravenous Q12H  . Warfarin - Pharmacist Dosing Inpatient   Does not apply q1600   Continuous Infusions: . sodium chloride    . sodium chloride 100 mL/hr at 07/03/19 0542   PRN Meds:.sodium chloride, acetaminophen, HYDROcodone-acetaminophen, ipratropium-albuterol, LORazepam, ondansetron (ZOFRAN) IV, sodium chloride flush     Results for orders placed or performed during the hospital encounter of 07/01/19 (from the past 48 hour(s))  Urinalysis, Routine w reflex microscopic     Status: Abnormal   Collection Time: 07/01/19 10:15 PM  Result Value Ref Range   Color, Urine YELLOW YELLOW   APPearance HAZY (A) CLEAR   Specific Gravity, Urine 1.011 1.005 - 1.030   pH 5.0 5.0 - 8.0   Glucose, UA NEGATIVE NEGATIVE mg/dL   Hgb urine dipstick SMALL (A) NEGATIVE   Bilirubin Urine NEGATIVE NEGATIVE   Ketones,  ur NEGATIVE NEGATIVE mg/dL   Protein, ur 30 (A) NEGATIVE mg/dL   Nitrite NEGATIVE NEGATIVE   Leukocytes,Ua NEGATIVE NEGATIVE   RBC / HPF 11-20 0 - 5 RBC/hpf   WBC, UA 6-10 0 - 5 WBC/hpf   Bacteria, UA RARE (A) NONE SEEN   Squamous Epithelial / LPF 0-5 0 - 5   Mucus PRESENT    Hyaline Casts, UA PRESENT     Comment: Performed at [facility], [address] [place], [place] [id number]  Urine culture     Status: None   Collection Time: 07/01/19 10:15 PM   Specimen: In/Out Cath Urine  Result Value Ref Range    Specimen Description      IN/OUT CATH URINE Performed at [facility], [address], [place], [place] [address]    Special Requests      NONE Performed at [facility], [address], [place], [place] [address]    Culture      NO GROWTH Performed at [facility], [place] [address], [place], [place] [address]    Report Status 07/03/2019 FINAL   Basic metabolic panel     Status: Abnormal   Collection Time: 07/02/19  4:43 AM  Result Value Ref Range   Sodium 147 (H) 135 - 145 mmol/L   Potassium 4.1 3.5 - 5.1 mmol/L   Chloride 108 98 - 111 mmol/L   CO2 27 22 - 32 mmol/L   Glucose, Bld 103 (H) 70 - 99 mg/dL    Comment: Glucose reference range applies only to samples taken after fasting for at least 8 hours.   BUN 52 (H) 8 - 23 mg/dL   Creatinine, Ser 2.22 (H) 0.61 - 1.24 mg/dL   Calcium 9.5 8.9 - 10.3 mg/dL   GFR calc non Af Amer 24 (L) >60 mL/min   GFR calc Af Amer 28 (L) >60 mL/min   Anion gap 12 5 - 15    Comment: Performed at [facility], [address], [place], [place] [address]  Protime-INR     Status: Abnormal   Collection Time: 07/02/19  4:43 AM  Result Value Ref Range   Prothrombin Time 23.7 (H) 11.4 - 15.2 seconds   INR 2.1 (H) 0.8 - 1.2    Comment: (NOTE) INR goal varies based on device and disease states. Performed at [facility] [address] [place], [place] [address]   CBC     Status: Abnormal   Collection Time: 07/02/19  4:43 AM  Result Value Ref Range   WBC 12.5 (H) 4.0 - 10.5 K/uL   RBC 4.66 4.22 - 5.81 MIL/uL   Hemoglobin 13.0 13.0 - 17.0 g/dL   HCT 41.7 39.0 - 52.0 %   MCV 89.5 80.0 - 100.0 fL   MCH 27.9 26.0 - 34.0 pg   MCHC 31.2 30.0 - 36.0 g/dL   RDW 15.5 11.5 - 15.5 %   Platelets 131 (L) 150 - 400 K/uL   nRBC 0.0 0.0 - 0.2 %    Comment: Performed at [facility], [address] [place],  [address]  Magnesium     Status: None   Collection Time: 07/02/19  4:43 AM  Result Value Ref Range   Magnesium 2.4 1.7 - 2.4 mg/dL     Comment: Performed at [facility], [address], [place],  [address]  Brain natriuretic peptide     Status: Abnormal   Collection Time: 07/02/19 12:47 PM  Result Value Ref Range   B  Natriuretic Peptide 1,193.0 (H) 0.0 - 100.0 pg/mL    Comment: Performed at [facility], [address], [place], [place] [address]  CBC with Differential/Platelet     Status: Abnormal   Collection Time: 07/02/19 12:47 PM  Result Value Ref Range   WBC 11.3 (H) 4.0 - 10.5 K/uL   RBC 4.45 4.22 - 5.81 MIL/uL   Hemoglobin 12.3 (L) 13.0 - 17.0 g/dL   HCT 39.5 39.0 - 52.0 %   MCV 88.8 80.0 - 100.0 fL   MCH 27.6 26.0 - 34.0 pg   MCHC 31.1 30.0 - 36.0 g/dL   RDW 15.3 11.5 - 15.5 %   Platelets 120 (L) 150 - 400 K/uL   nRBC 0.0 0.0 - 0.2 %   Neutrophils Relative % 85 %   Neutro Abs 9.6 (H) 1.7 - 7.7 K/uL   Lymphocytes Relative 7 %   Lymphs Abs 0.8 0.7 - 4.0 K/uL   Monocytes Relative 7 %   Monocytes Absolute 0.8 0.1 - 1.0 K/uL   Eosinophils Relative 0 %   Eosinophils Absolute 0.0 0.0 - 0.5 K/uL   Basophils Relative 0 %   Basophils Absolute 0.0 0.0 - 0.1 K/uL   Immature Granulocytes 1 %   Abs Immature Granulocytes 0.06 0.00 - 0.07 K/uL    Comment: Performed at [facility], [address], [place], [place] [id number]  Basic metabolic panel     Status: Abnormal   Collection Time: 07/02/19 12:47 PM  Result Value Ref Range   Sodium 144 135 - 145 mmol/L   Potassium 4.4 3.5 - 5.1 mmol/L   Chloride 106 98 - 111 mmol/L   CO2 29 22 - 32 mmol/L   Glucose, Bld 117 (H) 70 - 99 mg/dL    Comment: Glucose reference range applies only to samples taken after fasting for at least 8 hours.   BUN 54 (H) 8 - 23 mg/dL   Creatinine, Ser 2.26 (H) 0.61 - 1.24 mg/dL   Calcium 9.1 8.9 - 10.3 mg/dL   GFR calc non Af Amer 24 (L) >60 mL/min   GFR calc Af Amer 28 (L) >60 mL/min   Anion gap 9 5 - 15    Comment: Performed at [facility], [address], [place], [place] [address]  Glucose, capillary     Status: Abnormal    Collection Time: 07/02/19  5:55 PM  Result Value Ref Range   Glucose-Capillary 136 (H) 70 - 99 mg/dL    Comment: Glucose reference range applies only to samples taken after fasting for at least 8 hours.  Blood gas, arterial     Status: Abnormal   Collection Time: 07/02/19  6:48 PM  Result Value Ref Range   FIO2 32.00    pH, Arterial 7.357 7.350 - 7.450   pCO2 arterial 47.6 32.0 - 48.0 mmHg   pO2, Arterial 118 (H) 83.0 - 108.0 mmHg   Bicarbonate 25.0 20.0 - 28.0 mmol/L   Acid-Base Excess 1.1 0.0 - 2.0 mmol/L   O2 Saturation 98.1 %   Patient temperature 37.0    Allens test (pass/fail) PASS PASS    Comment: Performed at [facility], [address] [place], [place] [address]  MRSA PCR Screening     Status: None   Collection Time: 07/02/19  7:51 PM   Specimen: Nasal Mucosa; Nasopharyngeal  Result Value Ref Range   MRSA by PCR NEGATIVE NEGATIVE    Comment:        The GeneXpert MRSA Assay (FDA approved  for NASAL specimens only), is one component of a comprehensive MRSA colonization surveillance program. It is not intended to diagnose MRSA infection nor to guide or monitor treatment for MRSA infections. Performed at [facility], [address] [place], [place] [address]   Protime-INR     Status: Abnormal   Collection Time: 07/03/19  5:23 AM  Result Value Ref Range   Prothrombin Time 30.0 (H) 11.4 - 15.2 seconds   INR 2.9 (H) 0.8 - 1.2    Comment: (NOTE) INR goal varies based on device and disease states. Performed at [facility], [address], [place], [place] [address]   CBC     Status: Abnormal   Collection Time: 07/03/19  5:23 AM  Result Value Ref Range   WBC 10.9 (H) 4.0 - 10.5 K/uL   RBC 4.25 4.22 - 5.81 MIL/uL   Hemoglobin 11.7 (L) 13.0 - 17.0 g/dL   HCT 37.9 (L) 39.0 - 52.0 %   MCV 89.2 80.0 - 100.0 fL   MCH 27.5 26.0 - 34.0 pg   MCHC 30.9 30.0 - 36.0 g/dL   RDW 15.7 (H) 11.5 - 15.5 %   Platelets 119 (L) 150 - 400 K/uL    Comment: SPECIMEN CHECKED FOR  CLOTS Immature Platelet Fraction may be clinically indicated, consider ordering this additional test [id number] CONSISTENT WITH PREVIOUS RESULT    nRBC 0.0 0.0 - 0.2 %    Comment: Performed at [facility] [address], [place], [place] [id number]  Basic metabolic panel     Status: Abnormal   Collection Time: 07/03/19  5:23 AM  Result Value Ref Range   Sodium 143 135 - 145 mmol/L   Potassium 4.3 3.5 - 5.1 mmol/L   Chloride 108 98 - 111 mmol/L   CO2 26 22 - 32 mmol/L   Glucose, Bld 142 (H) 70 - 99 mg/dL    Comment: Glucose reference range applies only to samples taken after fasting for at least 8 hours.   BUN 55 (H) 8 - 23 mg/dL   Creatinine, Ser 1.94 (H) 0.61 - 1.24 mg/dL   Calcium 8.8 (L) 8.9 - 10.3 mg/dL   GFR calc non Af Amer 29 (L) >60 mL/min   GFR calc Af Amer 33 (L) >60 mL/min   Anion gap 9 5 - 15    Comment: Performed at [facility], [address] [place], [place] [address]  Ammonia     Status: None   Collection Time: 07/03/19  5:23 AM  Result Value Ref Range   Ammonia 12 9 - 35 umol/L    Comment: Performed at [facility], [address] [place], [place] [address]   *Note: Due to a large number of results and/or encounters for the requested time period, some results have not been displayed. A complete set of results can be found in Results Review.    Studies/Results:  EEG Technical aspects: This EEG study was done with scalp electrodes positioned according to the 10-20 International system of electrode placement. Electrical activity was acquired at a sampling rate of 500Hz  and reviewed with a high frequency filter of 70Hz  and a low frequency filter of 1Hz . EEG data were recorded continuously and digitally stored.   Description: No clear posterior dominant rhythm was seen. Sleep was characterized by vertex waves, maximal frontocentral region.  EEG showed continuous generalized 6-8 Hz theta-alpha activity.     Hyperventilation and photic stimulation were not performed.     Abnormality - Continuous slow, generalized  IMPRESSION:  This study is suggestive of mild to moderate diffuse encephalopathy. No seizures or epileptiform discharges were seen throughout the recording.    HEAD NECK CTA - PERFUSION   FINDINGS: CTA NECK FINDINGS  Aortic arch: Examination technically limited by timing of the contrast bolus, with poor opacification of the arterial system.  Visualized aortic arch of normal caliber with normal branch pattern. Moderate atherosclerotic change about the arch and origin of the great vessels without hemodynamically significant stenosis. Visualized subclavian arteries widely patent.  Right carotid system: Right CCA tortuous proximally but is widely patent to the bifurcation without flow-limiting stenosis. Extensive bulky calcified plaque about the right bifurcation/proximal right ICA with resultant severe stenosis of at least 75% by NASCET criteria. Right ICA otherwise patent distally to the skull base without stenosis, dissection, or occlusion.  Left carotid system: Left CCA mildly tortuous but patent to the bifurcation without flow-limiting stenosis. Extensive bulky plaque about the left bifurcation/proximal left ICA with resultant severe stenosis of up to left ICA otherwise patent to the skull base without stenosis, dissection or occlusion. Approximately 80% by NASCET criteria.  Vertebral arteries: Both vertebral arteries arise from the subclavian arteries. Atheromatous plaque at the origin of the vertebral arteries bilaterally with estimated 50-75% ostial stenosis bilaterally, left greater than right. Left vertebral artery slightly dominant. Vertebral arteries otherwise patent within the neck without stenosis, dissection, or occlusion.  Skeleton: No acute osseous abnormality. No discrete or worrisome osseous lesions. Exaggeration of the normal thoracic kyphosis noted. Degenerative changes noted about the left  TMJ.  Other neck: No other acute soft tissue abnormality within the neck. No mass lesion or adenopathy.  Upper chest: Mild scattered atelectatic changes noted within the visualized lungs. Underlying emphysema. Cardiomegaly with scattered coronary artery calcifications partially visualized. Visualized upper chest demonstrates no other acute finding.  Review of the MIP images confirms the above findings  CTA HEAD FINDINGS  Anterior circulation: Examination of the intracranial circulation technically limited by timing of the contrast bolus. Petrous segments patent bilaterally. Extensive calcified plaque throughout the carotid siphons with associated moderate to severe diffuse narrowing. ICA termini well perfused. A1 segments patent bilaterally. Normal anterior communicating artery. Anterior cerebral arteries patent to their distal aspects without stenosis. No M1 stenosis or occlusion. Normal MCA bifurcations. Distal MCA branches well perfused and symmetric.  Posterior circulation: Mild nonstenotic plaque noted within the proximal left V4 segment without significant stenosis. Left vertebral otherwise widely patent to the vertebrobasilar junction. Focal plaque within the proximal right V4 segment with short-segment mild to moderate stenosis. Left PICA patent. Right PICA not well seen. Basilar patent to its distal aspect without stenosis. Superior cerebral arteries patent bilaterally. Both PCAs primarily supplied via the basilar. PCAs well perfused to their distal aspects without stenosis.  Venous sinuses: Not well assessed due to timing of the contrast bolus.  Anatomic variants: None significant.  Review of the MIP images confirms the above findings  CT Brain Perfusion Findings:  ASPECTS: 10  CT perfusion portion of this exam failed due to poor timing of the contrast bolus. Study is nondiagnostic for evaluation of acute ischemia or perfusion  abnormality.  IMPRESSION: CTA HEAD AND NECK IMPRESSION:  1. Technically limited exam due to timing of the contrast bolus. 2. Negative CTA for emergent large vessel occlusion. 3. Heavy atheromatous plaque about the carotid bifurcations with associated stenoses of up to at least 75-80% by NASCET criteria bilaterally, left slightly worse than right. 4. Atheromatous plaque at the origins of both vertebral arteries with estimated  moderate to severe 50-75% stenoses bilaterally. Left vertebral artery dominant. 5. Extensive calcified plaque throughout the carotid siphons with associated moderate to severe diffuse narrowing. 6. Aortic Atherosclerosis (ICD10-I70.0) and Emphysema (ICD10-J43.9).  CT PERFUSION IMPRESSION:  CT perfusion portion of this exam failed due to poor timing of the contrast bolus. Examination is nondiagnostic for evaluation of possible ischemia or other perfusion abnormality.    BRAIN MRI FINDINGS: Brain: No restricted diffusion or evidence of acute infarction. Chronic lacunar type infarct of the left corona radiata and extending to the left deep gray nuclei.  Occasional superimposed small areas of chronic cortical encephalomalacia such as the right superior frontal gyrus on series 8, image 42, pre motor area. Scattered other white matter T2 and FLAIR hyperintensity, although generally mild for age. There are occasional chronic micro hemorrhages suspected (left periatrial white matter series 9, image 12). Brainstem and cerebellum appear largely negative.  Cavum septum pellucidum, normal variant. No midline shift, mass effect, evidence of mass lesion, ventriculomegaly, extra-axial collection or acute intracranial hemorrhage. Cervicomedullary junction and pituitary are within normal limits.  Vascular: Major intracranial vascular flow voids is are preserved.  Skull and upper cervical spine: Degenerative ligamentous hypertrophy about the odontoid.  Visualized bone marrow signal is within normal limits.  Sinuses/Orbits: Postoperative changes to both globes, otherwise negative orbits. Paranasal sinus mucosal thickening is stable and most pronounced in the left maxillary sinus.  Other: Mastoid air cells are well pneumatized. Limited detail of the internal auditory structures. Scalp and face soft tissues appear grossly negative.  IMPRESSION: 1. No acute intracranial abnormality. 2. Chronic small vessel ischemia, most pronounced in the left corona Radiata.    The brain MRI is reviewed in person.  No acute changes are noted on DWI. No hemorrhages noted. There is marked global atrophy consistent with his age. No hemorrhage is appreciated. There is a moderate size remote infarct involving the left basal ganglia extending rostrally to the centrum semiovale.   Halona Amstutz A. Merlene Laughter, M.D.  Diplomate, Tax adviser of Psychiatry and Neurology ( Neurology). 07/03/2019, 7:37 PM

## 2019-07-03 NOTE — Evaluation (Addendum)
Clinical/Bedside Swallow Evaluation Patient Details  Name: FODE GULINO MRN: [id number] Date of Birth: 11-19-1921  Today's Date: 07/03/2019 Time: SLP Start Time (ACUTE ONLY): 1025 SLP Stop Time (ACUTE ONLY): 1052 SLP Time Calculation (min) (ACUTE ONLY): 27 min  Past Medical History:  Past Medical History:  Diagnosis Date  . Arthritis    "all over"  . Atrial fibrillation ([place])   . Benign localized prostatic hyperplasia with lower urinary tract symptoms (LUTS)   . Bradycardia 11/28/2015   Severe-HR in the 20s to 30s following intubation for urological procedure.  Marland Kitchen CAD- non obstructive disease by cath 3/14 cardiologist-  dr hochrein  . Chronic lower back pain   . COPD (chronic obstructive pulmonary disease) ([place])   . DDD (degenerative disc disease)   . Depression   . Dyspnea    w/ exertion and lying down (raise head)  . ETOH abuse   . GERD (gastroesophageal reflux disease)   . Glaucoma   . Glaucoma, both eyes   . History of bladder cancer urologist-- dr Jeffie Pollock   first dx 2012--  s/p TURBT's  . History of DVT of lower extremity 03/2013   bilateral   . History of pulmonary embolus (PE) 04/2013  . HTN (hypertension)   . Hyperlipidemia   . Hypertension   . LBBB (left bundle branch block)    chronic  . Non-ischemic cardiomyopathy ([place])    last echo 01-18-2017, ef 35-40%  . NSVT (nonsustained ventricular tachycardia) ([place])    a. NSVT 06/2012; NSVT also seen during 03/2013 adm. b. Med rx. Not candidate for ICD given adv age.  Marland Kitchen PAD (peripheral artery disease) ([place])   . 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 PVCs (premature ventricular contractions)   . PVD (peripheral vascular disease) (HCC)    Right ABI .75, Left .78 (2006)  . Syncope 06/2012   a. Felt to be postural syncope related to diuretics 06/2012.  Marland Kitchen Systolic and diastolic CHF, chronic (HCC) cardiologsit-  dr hochrein   a. NICM  - patent cors 06/2012, EF 40% at that time. b. 03/2013 eval: EF 20-25%.  . Vision loss, left eye    due to glaucoma  . Wears glasses    Past Surgical History:  Past Surgical History:  Procedure Laterality Date  . AMPUTATION Left 10/16/2014   Procedure: AMPUTATION ABOVE KNEE- LEFT;  Surgeon: Mal Misty, MD;  Location: [place] [facility];  Service: Vascular;  Laterality: Left;  . CARDIAC CATHETERIZATION  05-11-2004  DR [facility]   MINIMAL CORANARY PLAQUE/ NORMAL LVF/ EF 55%/  NON-OBSTRUCTIVE LAD 25%  . CARDIAC CATHETERIZATION  06/18/2012   dr hochrein   mild luminal irregularities of coronaries, ef 45-50%  . CARDIOVASCULAR STRESS TEST  10-15-2010   LOW RISK NUCLEAR STUDY/ NO EVIDENCE OF ISCHEMIA/ NORMAL EF  . CATARACT EXTRACTION W/ INTRAOCULAR LENS  IMPLANT, BILATERAL Bilateral   . CYSTOSCOPY  10/07/2011   Procedure: CYSTOSCOPY;  Surgeon: Malka So, MD;  Location: [facility];  Service: Urology;  Laterality: N/A;  . CYSTOSCOPY N/A 10/20/2018   Procedure: CYSTOSCOPY WITH FULGURATION OF PROSTATIC URETHRAL TUMOR;  Surgeon: [name], MD;  Location: [facility];  Service: Urology;  Laterality: N/A;  . CYSTOSCOPY WITH BIOPSY  03/14/2012   Procedure: CYSTOSCOPY WITH BIOPSY;  Surgeon: Malka So, MD;  Location: [facility];  Service: Urology;  Laterality: N/A;  WITH FULGURATION  . CYSTOSCOPY WITH BIOPSY N/A  12/09/2016   Procedure: CYSTOSCOPY WITH BIOPSY AND FULGURATION;  Surgeon: [name], MD;  Location: [facility];  Service: Urology;  Laterality: N/A;  . CYSTOSCOPY WITH BIOPSY N/A 12/20/2017   Procedure: CYSTOSCOPY WITH BIOPSY WITH FULGURATION;  Surgeon: [name], MD;  Location: [facility];  Service: Urology;  Laterality: N/A;  . CYSTOSCOPY WITH BIOPSY N/A 06/09/2018   Procedure: CYSTOSCOPY WITH BIOPSY AND FULGURATION;  Surgeon: [name], MD;  Location: [facility];  Service: Urology;  Laterality: N/A;  . CYSTOSCOPY WITH FULGERATION N/A 01/20/2016   Procedure: CYSTOSCOPY, BIOPSY WITH FULGERATION OF  URTHRAL TUMOR;  Surgeon: [name], MD;  [facility];  Service: Urology;  Laterality: N/A;  . CYSTOSCOPY WITH FULGERATION N/A 06/01/2016   Procedure: CYSTOSCOPY WITH FULGERATION urethral tumors and bladder neck;  Surgeon: [name], MD;  Location[facility];  Service: Urology;  Laterality: N/A;  . SHOULDER SURGERY Left 1970's  . TONSILLECTOMY    . TRANSTHORACIC ECHOCARDIOGRAM  01-18-2017   dr hochrein   moderate LVH, ef 35-40%, diffuse hypokinesis/  trivial AR and MR/  mild TR  . TRANSURETHRAL RESECTION OF BLADDER TUMOR  10/07/2011   Procedure: TRANSURETHRAL RESECTION OF BLADDER TUMOR (TURBT);  Surgeon: Malka So, MD;  Location: [facility];  Service: Urology;  Laterality: N/A;  . TRANSURETHRAL RESECTION OF BLADDER TUMOR N/A 07/10/2015   Procedure: TRANSURETHRAL RESECTION OF BLADDER TUMOR (TURBT), CYSTOSCOPY ;  Surgeon: [name], MD;  Location[facility];  Service: Urology;  Laterality: N/A;  . TRANSURETHRAL RESECTION OF PROSTATE  03/14/2012   Procedure: TRANSURETHRAL RESECTION OF THE PROSTATE WITH GYRUS INSTRUMENTS;  Surgeon: Malka So, MD;  Location[facility];  Service: Urology;  Laterality: N/A;   HPI:  86 y.o. male with medical history significant of DNR/DNI, hx of a fib on coumadin, CAD s/p MI 2019, left BKA, diabetes, presented to the hospital on 07/01/19 with fever, lethargy, and shortness of breath.  His caretaker at the bedside reports that for the past 3 days or so the patient has been complaining about shortness of breath; CXR on 07/03/19 indicated Chronic central vascular congestion. 2. Chronic interstitial lung disease. 3. No acute airspace disease   Assessment / Plan / Recommendation Clinical Impression  Pt with min left labial weakness at rest, but this did not affect his intake of various consistencies including thin, puree and solids with no overt s/s of aspiration noted throughout PO trials; normal oropharyngeal swallow; education provided re:  swallowing/breathing reciprocity d/t current dyspnea and requirement of 4L O2 (pt not on O2 at home); Oxygen saturations ranged from 96-98 throughout trial.  Recommend continue current diet of heart healthy/thin; ST will f/u x1 for diet tolerance/aspiration/swallowing education with pt/caregivers.  Thank you for this consult. SLP Visit Diagnosis: Dysphagia, unspecified (R13.10)    Aspiration Risk  Mild aspiration risk    Diet Recommendation   Heart healthy/thin  Medication Administration: Whole meds with liquid    Other  Recommendations Oral Care Recommendations: Oral care BID   Follow up Recommendations Other (comment)(TBD)      Frequency and Duration min 1 x/week  1 week       Prognosis Prognosis for Safe Diet Advancement: Good      Swallow Study   General Date of Onset: 07/01/19 HPI: 86 y.o. male with medical history significant of DNR/DNI, hx of a fib on coumadin, CAD s/p MI 2019, left BKA, diabetes, presented to the hospital with fever, lethargy, and shortness of breath.  His caretaker at  the bedside reports that for the past 3 days or so the patient has been complaining about shortness of breath Type of Study: Bedside Swallow Evaluation Previous Swallow Assessment: (n/a) Diet Prior to this Study: Regular;Thin liquids Temperature Spikes Noted: No Respiratory Status: Nasal cannula;Other (comment)(4L) History of Recent Intubation: No Behavior/Cognition: Alert;Cooperative Oral Cavity Assessment: Within Functional Limits Oral Care Completed by SLP: Recent completion by staff Oral Cavity - Dentition: Missing dentition Self-Feeding Abilities: Able to feed self;Needs assist Patient Positioning: Upright in bed Baseline Vocal Quality: Normal Volitional Cough: Strong Volitional Swallow: Able to elicit    Oral/Motor/Sensory Function Overall Oral Motor/Sensory Function: Other (comment)(min left labial weakness at rest)   Ice Chips Ice chips: Within functional  limits Presentation: Spoon   Thin Liquid Thin Liquid: Within functional limits Presentation: Spoon;Cup;Straw    Nectar Thick Nectar Thick Liquid: Not tested   Honey Thick Honey Thick Liquid: Not tested   Puree Puree: Within functional limits Presentation: Self Fed Other Comments: O2 saturations ranged from 96-98 throughout snack; 4L O2   Solid     Solid: Within functional limits Presentation: Self Fed Other Comments: (utilized alternating solids/liquids d/t dry consistency give)      Elvina Sidle, M.S., CCC-SLP 07/03/2019,12:39 PM

## 2019-07-03 NOTE — Progress Notes (Signed)
Patient son asked to be called if Dr. Merlene Laughter comes to evaluate his father. This nurse notified his son [name] at [phone].

## 2019-07-03 NOTE — Progress Notes (Signed)
EEG complete - results pending 

## 2019-07-03 NOTE — Consult Note (Signed)
Consultation Note Date: 07/03/2019   Patient Name: Scott [name]  DOB: [birth year]  MRN: [id number]  Age / Sex: 86 y.o., male  PCP: Dettinger, Fransisca Kaufmann, MD Referring Physician: Roxan Hockey, MD  Reason for Consultation: Establishing goals of care  HPI/Patient Profile: 86 y.o. male  with past medical history of atrial fibrillation on Coumadin, combined systolic/diastolic CHF (EF 81-15%), CAD s/p MI 2019, CKD stage 3B, left BKA, h/o PE/DVT, PAD/PVD, diabetes, h/o bladder cancer, dementia, BPH, arthritis, left eye vision loss admitted on 07/01/2019 with low sats and lethargy likely secondary to UTI sepsis. Also treated for COPD exacerbation. Late 3/22 code stroke called due to acute mental status change noted by caregiver at bedside.   Clinical Assessment and Goals of Care: I met today with Mr. Allen and his caregiver/nephew, Remo Lipps, at bedside. Mr. Magallanes is alert and able to communicate but seems speech is a little more delayed than his baseline per Steven's report. However, he is much improved from his episode of unresponsiveness yesterday. Mr. Cueto has no complains.   Mr. Carton tells me that between Remo Lipps and his son, [name], he is in good shape. Mr. Bunn is able to stay in his own home and has caregiver support there with him although he is very independent and functional and able to transfer in and out of wheelchair. Caregivers assist with bathing. Overall family have arranged a very good setup to allow Mr. Grandpre to continue to have independence and optimized quality of life while also ensuring he has support at home as well. Son comes to eat dinner with him each night per Remo Lipps. No symptoms to be managed. Caregivers have been in place since ~2014 where they were initially caring for Mr. Ebling's wife who died ~2 years ago.   We discussed current hospitalization and I am glad that he is improved from yesterday.  He has good support at home even if he were to need more assistance in his recovery phase. Remo Lipps is aware that this acute illness is likely to take a lot out of Mr. Sposito and it may take him some time to recover and he may need more assistance even at home. Mr. Dawood remains stable and bloodwork trending in the right direction. Plans for chest xray today.   My overall assessment is that Mr. Harrel has a good quality of life at baseline. They are hopeful for continued improvement. He has a good support system.   Primary Decision Maker PATIENT and son, [name]    SUMMARY OF RECOMMENDATIONS   - Hopeful for improvement and return to home environment.  - TOC to follow for services at home. Remo Lipps requests [facility] for home health.   - Palliative care will be available as needed for any changes status or health concerns.   Code Status/Advance Care Planning:  DNR   Symptom Management:   Per attending and neurology.   Palliative Prophylaxis:   Delirium Protocol and Turn Reposition  Psycho-social/Spiritual:   Desire for further Chaplaincy support:no  Additional Recommendations:  Caregiving  Support/Resources  Prognosis:   Unable to determine  Discharge Planning: Home with Home Health      Primary Diagnoses: Present on Admission: . Benign localized hyperplasia of prostate with urinary obstruction . Chronic combined systolic and diastolic heart failure ([place]) . Essential hypertension . Chronic kidney disease (CKD) stage G3a/A1, moderately decreased glomerular filtration rate (GFR) between 45-59 mL/min/1.73 square meter and albuminuria creatinine ratio less than 30 mg/g (HCC) . Paroxysmal atrial fibrillation (HCC) . Acute combined systolic (congestive) and diastolic (congestive) heart failure ([place]) . Sepsis due to other etiology ([place]) . UTI (urinary tract infection)   I have reviewed the medical record, interviewed the patient and family, and examined the patient. The  following aspects are pertinent.  Past Medical History:  Diagnosis Date  . Arthritis    "all over"  . Atrial fibrillation ([place])   . Benign localized prostatic hyperplasia with lower urinary tract symptoms (LUTS)   . Bradycardia 11/28/2015   Severe-HR in the 20s to 30s following intubation for urological procedure.  Marland Kitchen CAD- non obstructive disease by cath 3/14 cardiologist-  dr hochrein  . Chronic lower back pain   . COPD (chronic obstructive pulmonary disease) ([place])   . DDD (degenerative disc disease)   . Depression   . Dyspnea    w/ exertion and lying down (raise head)  . ETOH abuse   . GERD (gastroesophageal reflux disease)   . Glaucoma   . Glaucoma, both eyes   . History of bladder cancer urologist-- dr Jeffie Pollock   first dx 2012--  s/p TURBT's  . History of DVT of lower extremity 03/2013   bilateral   . History of pulmonary embolus (PE) 04/2013  . HTN (hypertension)   . Hyperlipidemia   . Hypertension   . LBBB (left bundle branch block)    chronic  . Non-ischemic cardiomyopathy ([place])    last echo 01-18-2017, ef 35-40%  . NSVT (nonsustained ventricular tachycardia) ([place])    a. NSVT 06/2012; NSVT also seen during 03/2013 adm. b. Med rx. Not candidate for ICD given adv age.  Marland Kitchen PAD (peripheral artery disease) ([place])   . 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 PVCs (premature ventricular contractions)   . PVD (peripheral vascular disease) (HCC)    Right ABI .75, Left .78 (2006)  . Syncope 06/2012   a. Felt to be postural syncope related to diuretics 06/2012.  Marland Kitchen Systolic and diastolic CHF, chronic (HCC) cardiologsit-  dr hochrein   a. NICM - patent cors 06/2012, EF 40% at that time. b. 03/2013 eval: EF 20-25%.  . Vision loss, left eye    due to glaucoma  . Wears glasses    Social History   Socioeconomic History  . Marital status: Married    Spouse name: Not on file  . Number of  children: 1  . Years of education: Not on file  . Highest education level: Not on file  Occupational History  . Occupation: Retired  Tobacco Use  . Smoking status: Former Smoker    Packs/day: 1.00    Years: 35.00    Pack years: 35.00    Types: Cigarettes    Start date: 04/12/1942    Quit date: 04/13/1975    Years since quitting: 44.2  . Smokeless tobacco: Never Used  Substance and Sexual Activity  . Alcohol use: Not Currently    Alcohol/week: 7.0 standard drinks    Types:  7 Cans of beer per week  . Drug use: No  . Sexual activity: Never  Other Topics Concern  . Not on file  Social History Narrative   Lives in [place] with spouse who is s/p spinal cord injury.  He has one grown son who lives in [place].  He is retired but worked for [employer] for years and lives in [place].   Social Determinants of Health   Financial Resource Strain:   . Difficulty of Paying Living Expenses:   Food Insecurity:   . Worried About Charity fundraiser in the Last Year:   . Arboriculturist in the Last Year:   Transportation Needs:   . Film/video editor (Medical):   Marland Kitchen Lack of Transportation (Non-Medical):   Physical Activity:   . Days of Exercise per Week:   . Minutes of Exercise per Session:   Stress:   . Feeling of Stress :   Social Connections:   . Frequency of Communication with Friends and Family:   . Frequency of Social Gatherings with Friends and Family:   . Attends Religious Services:   . Active Member of Clubs or Organizations:   . Attends Archivist Meetings:   Marland Kitchen Marital Status:    Family History  Problem Relation Age of Onset  . Hypertension Mother   . Arthritis Mother   . Lung cancer Sister   . Deep vein thrombosis Father   . Early death Brother    Scheduled Meds: . aspirin EC  81 mg Oral Daily  . azithromycin  250 mg Oral Daily  . carvedilol  6.25 mg Oral BID WC  . Chlorhexidine Gluconate Cloth  6 each Topical Daily  . donepezil  10 mg Oral QHS    . febuxostat  40 mg Oral Daily  . fluticasone  1 spray Each Nare QHS  . folic acid  1 mg Oral Daily  . furosemide  40 mg Intravenous BID  . gabapentin  200 mg Oral BID  . guaiFENesin  600 mg Oral BID  . isosorbide dinitrate  20 mg Oral TID   And  . hydrALAZINE  37.5 mg Oral TID  . mouth rinse  15 mL Mouth Rinse BID  . methylPREDNISolone (SOLU-MEDROL) injection  40 mg Intravenous Q12H  . mirabegron ER  25 mg Oral q morning - 10a  . pantoprazole  40 mg Oral Daily  . potassium chloride  10 mEq Oral BID  . sertraline  50 mg Oral q morning - 10a  . simvastatin  10 mg Oral q1800  . sodium chloride flush  3 mL Intravenous Q12H  . Warfarin - Pharmacist Dosing Inpatient   Does not apply q1600   Continuous Infusions: . sodium chloride    . sodium chloride 100 mL/hr at 07/03/19 0542  . cefTRIAXone (ROCEPHIN)  IV Stopped (07/02/19 1552)   PRN Meds:.sodium chloride, acetaminophen, HYDROcodone-acetaminophen, ipratropium-albuterol, ondansetron (ZOFRAN) IV, sodium chloride flush Allergies  Allergen Reactions  . Lipitor [Atorvastatin] Other (See Comments)    Unknown rxn per pt   Review of Systems  Constitutional: Positive for activity change and fatigue. Negative for appetite change.  Respiratory: Negative for shortness of breath.   Cardiovascular: Negative for chest pain.  Neurological: Positive for weakness.    Physical Exam Vitals and nursing note reviewed.  Constitutional:      General: He is not in acute distress.    Appearance: He is ill-appearing.  Cardiovascular:     Rate and  Rhythm: Normal rate.  Pulmonary:     Effort: Pulmonary effort is normal. No tachypnea, accessory muscle usage or respiratory distress.  Abdominal:     General: Abdomen is flat.  Neurological:     Mental Status: He is alert and oriented to person, place, and time.     Comments: Speech slightly delayed     Vital Signs: BP 137/65   Pulse 79   Temp 98.2 F (36.8 C) (Oral)   Resp 19   Ht 5' 5"  (1.651 m)   Wt 67.5 kg   SpO2 94%   BMI 24.76 kg/m  Pain Scale: 0-10   Pain Score: 0-No pain   SpO2: SpO2: 94 % O2 Device:SpO2: 94 % O2 Flow Rate: .O2 Flow Rate (L/min): 3 L/min  IO: Intake/output summary:   Intake/Output Summary (Last 24 hours) at 07/03/2019 0906 Last data filed at 07/03/2019 0617 Gross per 24 hour  Intake 1186.83 ml  Output 1550 ml  Net -363.17 ml    LBM: Last BM Date: 07/01/19 Baseline Weight: Weight: 66.2 kg Most recent weight: Weight: 67.5 kg     Palliative Assessment/Data:     Time In: 1130 Time Out: 1230 Time Total: 60 min Greater than 50%  of this time was spent counseling and coordinating care related to the above assessment and plan.  Signed by: Vinie Sill, NP Palliative Medicine Team Pager # [phone] (M-F 8a-5p) Team Phone # [phone] (Nights/Weekends)

## 2019-07-03 NOTE — Progress Notes (Signed)
Patient Demographics:    Scott [name], is a 86 y.o. male, DOB - [birth year], LHT:[id number]  Admit date - 07/01/2019   Admitting Physician Neena Rhymes, MD  Outpatient Primary MD for the patient is Dettinger, Fransisca Kaufmann, MD  LOS - 2   Chief Complaint  Patient presents with  . Altered Mental Status        Subjective:    Scott [name] today has no  emesis,    -had another episode of "Jaynee Eagles took several videos while patient was unresponsive  ---My serial exam leaves me suspicious for possible seizures --No jerky or grand mal type activity-   Has intermittent episodes of unresponsiveness and severe lethargy  -Patient had similar episodes on 07/02/2019 --stroke work-up essentially negative -EEG without acute findings   Assessment  & Plan :    Principal Problem:   Sepsis due to other etiology [facility]) Active Problems:   UTI (urinary tract infection)   Paroxysmal atrial fibrillation (HCC)   Acute combined systolic (congestive) and diastolic (congestive) heart failure (HCC)   Benign localized hyperplasia of prostate with urinary obstruction   Chronic combined systolic and diastolic heart failure (HCC)   NICM (nonischemic cardiomyopathy)- EF 20-25% by Echo 04/12/13   Essential hypertension   Chronic kidney disease (CKD) stage G3a/A1, moderately decreased glomerular filtration rate (GFR) between 45-59 mL/min/1.73 square meter and albuminuria creatinine ratio less than 30 mg/g (HCC)   Goals of care, counseling/discussion   Palliative care encounter  Brief Summary:- 86 y.o. 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 fib, hypertension, chronic kidney disease stage III, COPD, bladder cancer, CAD, left AKA admitted with fevers and sepsis pathophysiology on 07/01/2019 with concerns about urinary source -Now with  concerns about possible seizures  A/p 1)Sepsis Secondary to Urinary Source---on admission pt met sepsis criteria with fevers, tachycardia , tachypnea , Leukocytosis and Hypoxia  -WBC is down to 10.9 from 14.3 however anticipate further rising WBC with initiation of steroid therapy for COPD -Continue Rocephin/Azithr (started 07/01/19) -UA suspicious for possible UTI however patient has history of bladder cancer and BPH -Chest x-ray without pneumonia, however patient has wheezing and hypoxia consistent with COPD -blood and urine cultures NGTD   2)HFrEF--- patient with combined diastolic and systolic dysfunction CHF last known EF 35 to 40%, repeat echo pending--- chest x-ray without overt CHF exacerbation, BNP elevated at 1507 (current BNP level which are similar to patient's prior) -Patient with wheezing, shortness of breath and hypoxia--more consistent with COPD exacerbation rather CHF exacerbation -Troponins are elevated peaking at 2252--- currently trending down, -Patient with history of previous elevated troponins -Await echocardiogram and if there is a significant drop in EF or other significant changes from prior will consider cardiology consult -Given concerns about sepsis avoid over aggressive diuresis  switched from IV Lasix to p.o. Lasix -Repeat chest x-ray on 07/03/2019 without acute findings  3)Acute COPD Exacerbation- no definite pneumonia,  C/n  IV Solu-Medrol 40 mg every 12 hours, c/n  mucolytics, antibiotics (as above in # 1)  and bronchodilators as ordered, supplemental oxygen as ordered.   4)Possible Seizures Vs Tia----- had another episode of "Jaynee Eagles took several videos while patient was unresponsive ---My serial exam leaves me  suspicious for possible seizures ---No jerky or grand mal type activity-   Has intermittent episodes of unresponsiveness and severe letharg ---Patient had similar episodes on 07/02/2019 stroke work-up including CT head and  CTA head and neck essentially negative -EEG without acute findings -MRI brain pending -Okay to start Keppra -May use IV lorazepam as needed seizures -Neurology consult from Dr. Merlene Laughter requested and pending  5)H/o PAFib--- on telemetry episodes of tachyarrhythmia --atrial tachycardia -Potassium was 4.4, check magnesium -INR is 2.1, continue Coumadin anticoagulation -Continue Coreg for rate control at 6.25 mg twice daily  6)CKD stage -  IIIb---- appears at baseline,  creatinine on admission= 2.5 , baseline creatinine =  2.19 (01/08/19)  , creatinine is now=  1.94, renally adjust medications, avoid nephrotoxic agents / dehydration  / hypotension  7)H/o PAD/CAD--- stable, continue aspirin, continue Coreg, -continue simvastatin, continue isosorbide and hydralazine combo -Continue Coumadin therapy  8)History of DVT/PE -Continue Coumadin therapy  9)Dementia without behavioral disturbances -Continue Aricept and Zoloft,   10)Acute hypoxic respiratory failure secondary to #1 and #3 above--continue supplemental oxygen   Disposition/Need for in-Hospital Stay- patient unable to be discharged at this time due to --acute hypoxia and concerns about sepsis requiring IV antibiotics pending cultures ---Has intermittent episodes of unresponsiveness and severe lethargy with seizure concerns -Not medically ready for discharge however anticipate discharge home with home health when medically ready  Code Status : DNR  Family Communication:   (---- Discussed with caregiver [name] at bedside  Consults  :  na  DVT Prophylaxis  : Coumadin therapy SCDs   Lab Results  Component Value Date   PLT 119 (L) 07/03/2019    Inpatient Medications  Scheduled Meds: . aspirin EC  81 mg Oral Daily  . azithromycin  250 mg Oral Daily  . carvedilol  6.25 mg Oral BID WC  . Chlorhexidine Gluconate Cloth  6 each Topical Daily  . donepezil  10 mg Oral QHS  . febuxostat  40 mg Oral Daily  . fluticasone  1  spray Each Nare QHS  . folic acid  1 mg Oral Daily  . furosemide  40 mg Intravenous BID  . gabapentin  200 mg Oral BID  . guaiFENesin  600 mg Oral BID  . isosorbide dinitrate  20 mg Oral TID   And  . hydrALAZINE  37.5 mg Oral TID  . mouth rinse  15 mL Mouth Rinse BID  . methylPREDNISolone (SOLU-MEDROL) injection  40 mg Intravenous Q12H  . mirabegron ER  25 mg Oral q morning - 10a  . pantoprazole  40 mg Oral Daily  . potassium chloride  10 mEq Oral BID  . sertraline  50 mg Oral q morning - 10a  . simvastatin  10 mg Oral q1800  . sodium chloride flush  3 mL Intravenous Q12H  . Warfarin - Pharmacist Dosing Inpatient   Does not apply q1600   Continuous Infusions: . sodium chloride    . sodium chloride 100 mL/hr at 07/03/19 0542  . cefTRIAXone (ROCEPHIN)  IV 1 g (07/03/19 1443)  . levETIRAcetam     PRN Meds:.sodium chloride, acetaminophen, HYDROcodone-acetaminophen, ipratropium-albuterol, LORazepam, ondansetron (ZOFRAN) IV, sodium chloride flush    Anti-infectives (From admission, onward)   Start     Dose/Rate Route Frequency Ordered Stop   07/02/19 1600  azithromycin (ZITHROMAX) tablet 250 mg     250 mg Oral Daily 07/01/19 1812 07/06/19 1559   07/02/19 1500  cefTRIAXone (ROCEPHIN) 1 g in sodium chloride 0.9 % 100  mL IVPB     1 g 200 mL/hr over 30 Minutes Intravenous Every 24 hours 07/01/19 1812     07/01/19 1500  cefTRIAXone (ROCEPHIN) 1 g in sodium chloride 0.9 % 100 mL IVPB     1 g 200 mL/hr over 30 Minutes Intravenous  Once 07/01/19 1452 07/01/19 1549   07/01/19 1500  azithromycin (ZITHROMAX) 500 mg in sodium chloride 0.9 % 250 mL IVPB  Status:  Discontinued     500 mg 250 mL/hr over 60 Minutes Intravenous Every 24 hours 07/01/19 1452 07/01/19 2048       Objective:   Vitals:   07/03/19 1000 07/03/19 1100 07/03/19 1300 07/03/19 1400  BP: (!) 121/56 121/67 122/86 115/60  Pulse: 71 82 80 69  Resp: 20 15 14 20   Temp:      TempSrc:      SpO2: 97% 96% 99% 94%  Weight:       Height:        Wt Readings from Last 3 Encounters:  07/03/19 67.5 kg  01/26/19 86.2 kg  12/26/18 86.2 kg     Intake/Output Summary (Last 24 hours) at 07/03/2019 1723 Last data filed at 07/03/2019 1209 Gross per 24 hour  Intake 1306.83 ml  Output 2550 ml  Net -1243.17 ml    Physical Exam Gen:- Awake Alert,  In no apparent distress (intermittent episodes of unresponsiveness) HEENT:- [place].AT, No sclera icterus Neck-Supple Neck,No JVD,.  Lungs-slightly diminished air movement, scattered wheezes CV- S1, S2 normal, irregularly irregular, tachycardic Abd-  +ve B.Sounds, Abd Soft, No tenderness, no CVA tenderness Extremity/Skin:- No  edema, pedal pulses present, Lt BKA Psych-affect is appropriate, oriented x3 Neuro-generalized weakness no new focal deficits, no tremors   Data Review:   Micro Results Recent Results (from the past 240 hour(s))  Blood Culture (routine x 2)     Status: None (Preliminary result)   Collection Time: 07/01/19  2:52 PM   Specimen: BLOOD RIGHT ARM  Result Value Ref Range Status   Specimen Description BLOOD RIGHT ARM  Final   Special Requests   Final    BOTTLES DRAWN AEROBIC AND ANAEROBIC Blood Culture results may not be optimal due to an inadequate volume of blood received in culture bottles   Culture   Final    NO GROWTH 2 DAYS Performed at [facility], [address], [place], [place] [address]    Report Status PENDING  Incomplete  Blood Culture (routine x 2)     Status: None (Preliminary result)   Collection Time: 07/01/19  3:15 PM   Specimen: BLOOD LEFT ARM  Result Value Ref Range Status   Specimen Description BLOOD LEFT ARM  Final   Special Requests   Final    BOTTLES DRAWN AEROBIC AND ANAEROBIC Blood Culture results may not be optimal due to an inadequate volume of blood received in culture bottles   Culture   Final    NO GROWTH 2 DAYS Performed at [facility], [address], [place], [place] [address]    Report Status PENDING  Incomplete    SARS CORONAVIRUS 2 (TAT 6-24 HRS) Nasopharyngeal Nasopharyngeal Swab     Status: None   Collection Time: 07/01/19  4:21 PM   Specimen: Nasopharyngeal Swab  Result Value Ref Range Status   SARS Coronavirus 2 NEGATIVE NEGATIVE Final    Comment: (NOTE) SARS-CoV-2 target nucleic acids are NOT DETECTED. The SARS-CoV-2 RNA is generally detectable in upper and lower respiratory specimens during the acute phase of infection. Negative  results do not preclude SARS-CoV-2 infection, do not rule out co-infections with other pathogens, and should not be used as the sole basis for treatment or other patient management decisions. Negative results must be combined with clinical observations, patient history, and epidemiological information. The expected result is Negative. Fact Sheet for Patients: SugarRoll.be Fact Sheet for Healthcare Providers: https://www.woods-mathews.com/ This test is not yet approved or cleared by the [place] FDA and  has been authorized for detection and/or diagnosis of SARS-CoV-2 by FDA under an Emergency Use Authorization (EUA). This EUA will remain  in effect (meaning this test can be used) for the duration of the COVID-19 declaration under Section 56 4(b)(1) of the Act, 21 U.S.C. section 360bbb-3(b)(1), unless the authorization is terminated or revoked sooner. Performed at [facility], [place] [address] [place] [address], [place], [place] [address]   Urine culture     Status: None   Collection Time: 07/01/19 10:15 PM   Specimen: In/Out Cath Urine  Result Value Ref Range Status   Specimen Description   Final    IN/OUT CATH URINE Performed at [facility], [address], [place], [place] [address]    Special Requests   Final    NONE Performed at [facility], [address], [place], [place] [address]    Culture   Final    NO GROWTH Performed at [facility], [place] [address], [place], [place] [address]    Report Status  07/03/2019 FINAL  Final  MRSA PCR Screening     Status: None   Collection Time: 07/02/19  7:51 PM   Specimen: Nasal Mucosa; Nasopharyngeal  Result Value Ref Range Status   MRSA by PCR NEGATIVE NEGATIVE Final    Comment:        The GeneXpert MRSA Assay (FDA approved for NASAL specimens only), is one component of a comprehensive MRSA colonization surveillance program. It is not intended to diagnose MRSA infection nor to guide or monitor treatment for MRSA infections. Performed at [facility], [address] [place], [place] [address]     Radiology Reports CT ANGIO HEAD W OR WO CONTRAST  Result Date: 07/02/2019 CLINICAL DATA:  Initial evaluation for acute altered mental status. EXAM: CT ANGIOGRAPHY HEAD AND NECK CT PERFUSION BRAIN TECHNIQUE: Multidetector CT imaging of the head and neck was performed using the standard protocol during bolus administration of intravenous contrast. Multiplanar CT image reconstructions and MIPs were obtained to evaluate the vascular anatomy. Carotid stenosis measurements (when applicable) are obtained utilizing NASCET criteria, using the distal internal carotid diameter as the denominator. Multiphase CT imaging of the brain was performed following IV bolus contrast injection. Subsequent parametric perfusion maps were calculated using RAPID software. CONTRAST:  166m OMNIPAQUE IOHEXOL 350 MG/ML SOLN COMPARISON:  Prior head CT from earlier same day. FINDINGS: CTA NECK FINDINGS Aortic arch: Examination technically limited by timing of the contrast bolus, with poor opacification of the arterial system. Visualized aortic arch of normal caliber with normal branch pattern. Moderate atherosclerotic change about the arch and origin of the great vessels without hemodynamically significant stenosis. Visualized subclavian arteries widely patent. Right carotid system: Right CCA tortuous proximally but is widely patent to the bifurcation without flow-limiting stenosis. Extensive  bulky calcified plaque about the right bifurcation/proximal right ICA with resultant severe stenosis of at least 75% by NASCET criteria. Right ICA otherwise patent distally to the skull base without stenosis, dissection, or occlusion. Left carotid system: Left CCA mildly tortuous but patent to the bifurcation without flow-limiting stenosis.  Extensive bulky plaque about the left bifurcation/proximal left ICA with resultant severe stenosis of up to left ICA otherwise patent to the skull base without stenosis, dissection or occlusion. Approximately 80% by NASCET criteria. Vertebral arteries: Both vertebral arteries arise from the subclavian arteries. Atheromatous plaque at the origin of the vertebral arteries bilaterally with estimated 50-75% ostial stenosis bilaterally, left greater than right. Left vertebral artery slightly dominant. Vertebral arteries otherwise patent within the neck without stenosis, dissection, or occlusion. Skeleton: No acute osseous abnormality. No discrete or worrisome osseous lesions. Exaggeration of the normal thoracic kyphosis noted. Degenerative changes noted about the left TMJ. Other neck: No other acute soft tissue abnormality within the neck. No mass lesion or adenopathy. Upper chest: Mild scattered atelectatic changes noted within the visualized lungs. Underlying emphysema. Cardiomegaly with scattered coronary artery calcifications partially visualized. Visualized upper chest demonstrates no other acute finding. Review of the MIP images confirms the above findings CTA HEAD FINDINGS Anterior circulation: Examination of the intracranial circulation technically limited by timing of the contrast bolus. Petrous segments patent bilaterally. Extensive calcified plaque throughout the carotid siphons with associated moderate to severe diffuse narrowing. ICA termini well perfused. A1 segments patent bilaterally. Normal anterior communicating artery. Anterior cerebral arteries patent to their  distal aspects without stenosis. No M1 stenosis or occlusion. Normal MCA bifurcations. Distal MCA branches well perfused and symmetric. Posterior circulation: Mild nonstenotic plaque noted within the proximal left V4 segment without significant stenosis. Left vertebral otherwise widely patent to the vertebrobasilar junction. Focal plaque within the proximal right V4 segment with short-segment mild to moderate stenosis. Left PICA patent. Right PICA not well seen. Basilar patent to its distal aspect without stenosis. Superior cerebral arteries patent bilaterally. Both PCAs primarily supplied via the basilar. PCAs well perfused to their distal aspects without stenosis. Venous sinuses: Not well assessed due to timing of the contrast bolus. Anatomic variants: None significant. Review of the MIP images confirms the above findings CT Brain Perfusion Findings: ASPECTS: 10 CT perfusion portion of this exam failed due to poor timing of the contrast bolus. Study is nondiagnostic for evaluation of acute ischemia or perfusion abnormality. IMPRESSION: CTA HEAD AND NECK IMPRESSION: 1. Technically limited exam due to timing of the contrast bolus. 2. Negative CTA for emergent large vessel occlusion. 3. Heavy atheromatous plaque about the carotid bifurcations with associated stenoses of up to at least 75-80% by NASCET criteria bilaterally, left slightly worse than right. 4. Atheromatous plaque at the origins of both vertebral arteries with estimated moderate to severe 50-75% stenoses bilaterally. Left vertebral artery dominant. 5. Extensive calcified plaque throughout the carotid siphons with associated moderate to severe diffuse narrowing. 6. Aortic Atherosclerosis (ICD10-I70.0) and Emphysema (ICD10-J43.9). CT PERFUSION IMPRESSION: CT perfusion portion of this exam failed due to poor timing of the contrast bolus. Examination is nondiagnostic for evaluation of possible ischemia or other perfusion abnormality. Electronically Signed    By: Jeannine Boga M.D.   On: 07/02/2019 20:27   DG Chest 2 View  Result Date: 07/03/2019 CLINICAL DATA:  Dyspnea EXAM: CHEST - 2 VIEW COMPARISON:  07/01/2019 FINDINGS: Frontal and lateral views of the chest demonstrate a stable cardiac silhouette. There is persistent central vascular congestion. Chronic background interstitial scarring. No airspace disease, effusion, or pneumothorax. No acute bony abnormalities. IMPRESSION: 1. Chronic central vascular congestion. 2. Chronic interstitial lung disease. 3. No acute airspace disease. Electronically Signed   By: Randa Ngo M.D.   On: 07/03/2019 12:20   CT ANGIO NECK W OR WO CONTRAST  Result Date: 07/02/2019 CLINICAL DATA:  Initial evaluation for acute altered mental status. EXAM: CT ANGIOGRAPHY HEAD AND NECK CT PERFUSION BRAIN TECHNIQUE: Multidetector CT imaging of the head and neck was performed using the standard protocol during bolus administration of intravenous contrast. Multiplanar CT image reconstructions and MIPs were obtained to evaluate the vascular anatomy. Carotid stenosis measurements (when applicable) are obtained utilizing NASCET criteria, using the distal internal carotid diameter as the denominator. Multiphase CT imaging of the brain was performed following IV bolus contrast injection. Subsequent parametric perfusion maps were calculated using RAPID software. CONTRAST:  151m OMNIPAQUE IOHEXOL 350 MG/ML SOLN COMPARISON:  Prior head CT from earlier same day. FINDINGS: CTA NECK FINDINGS Aortic arch: Examination technically limited by timing of the contrast bolus, with poor opacification of the arterial system. Visualized aortic arch of normal caliber with normal branch pattern. Moderate atherosclerotic change about the arch and origin of the great vessels without hemodynamically significant stenosis. Visualized subclavian arteries widely patent. Right carotid system: Right CCA tortuous proximally but is widely patent to the bifurcation  without flow-limiting stenosis. Extensive bulky calcified plaque about the right bifurcation/proximal right ICA with resultant severe stenosis of at least 75% by NASCET criteria. Right ICA otherwise patent distally to the skull base without stenosis, dissection, or occlusion. Left carotid system: Left CCA mildly tortuous but patent to the bifurcation without flow-limiting stenosis. Extensive bulky plaque about the left bifurcation/proximal left ICA with resultant severe stenosis of up to left ICA otherwise patent to the skull base without stenosis, dissection or occlusion. Approximately 80% by NASCET criteria. Vertebral arteries: Both vertebral arteries arise from the subclavian arteries. Atheromatous plaque at the origin of the vertebral arteries bilaterally with estimated 50-75% ostial stenosis bilaterally, left greater than right. Left vertebral artery slightly dominant. Vertebral arteries otherwise patent within the neck without stenosis, dissection, or occlusion. Skeleton: No acute osseous abnormality. No discrete or worrisome osseous lesions. Exaggeration of the normal thoracic kyphosis noted. Degenerative changes noted about the left TMJ. Other neck: No other acute soft tissue abnormality within the neck. No mass lesion or adenopathy. Upper chest: Mild scattered atelectatic changes noted within the visualized lungs. Underlying emphysema. Cardiomegaly with scattered coronary artery calcifications partially visualized. Visualized upper chest demonstrates no other acute finding. Review of the MIP images confirms the above findings CTA HEAD FINDINGS Anterior circulation: Examination of the intracranial circulation technically limited by timing of the contrast bolus. Petrous segments patent bilaterally. Extensive calcified plaque throughout the carotid siphons with associated moderate to severe diffuse narrowing. ICA termini well perfused. A1 segments patent bilaterally. Normal anterior communicating artery.  Anterior cerebral arteries patent to their distal aspects without stenosis. No M1 stenosis or occlusion. Normal MCA bifurcations. Distal MCA branches well perfused and symmetric. Posterior circulation: Mild nonstenotic plaque noted within the proximal left V4 segment without significant stenosis. Left vertebral otherwise widely patent to the vertebrobasilar junction. Focal plaque within the proximal right V4 segment with short-segment mild to moderate stenosis. Left PICA patent. Right PICA not well seen. Basilar patent to its distal aspect without stenosis. Superior cerebral arteries patent bilaterally. Both PCAs primarily supplied via the basilar. PCAs well perfused to their distal aspects without stenosis. Venous sinuses: Not well assessed due to timing of the contrast bolus. Anatomic variants: None significant. Review of the MIP images confirms the above findings CT Brain Perfusion Findings: ASPECTS: 10 CT perfusion portion of this exam failed due to poor timing of the contrast bolus. Study is nondiagnostic for evaluation of acute ischemia or perfusion  abnormality. IMPRESSION: CTA HEAD AND NECK IMPRESSION: 1. Technically limited exam due to timing of the contrast bolus. 2. Negative CTA for emergent large vessel occlusion. 3. Heavy atheromatous plaque about the carotid bifurcations with associated stenoses of up to at least 75-80% by NASCET criteria bilaterally, left slightly worse than right. 4. Atheromatous plaque at the origins of both vertebral arteries with estimated moderate to severe 50-75% stenoses bilaterally. Left vertebral artery dominant. 5. Extensive calcified plaque throughout the carotid siphons with associated moderate to severe diffuse narrowing. 6. Aortic Atherosclerosis (ICD10-I70.0) and Emphysema (ICD10-J43.9). CT PERFUSION IMPRESSION: CT perfusion portion of this exam failed due to poor timing of the contrast bolus. Examination is nondiagnostic for evaluation of possible ischemia or other  perfusion abnormality. Electronically Signed   By: Jeannine Boga M.D.   On: 07/02/2019 20:27   CT CEREBRAL PERFUSION W CONTRAST  Result Date: 07/02/2019 CLINICAL DATA:  Initial evaluation for acute altered mental status. EXAM: CT ANGIOGRAPHY HEAD AND NECK CT PERFUSION BRAIN TECHNIQUE: Multidetector CT imaging of the head and neck was performed using the standard protocol during bolus administration of intravenous contrast. Multiplanar CT image reconstructions and MIPs were obtained to evaluate the vascular anatomy. Carotid stenosis measurements (when applicable) are obtained utilizing NASCET criteria, using the distal internal carotid diameter as the denominator. Multiphase CT imaging of the brain was performed following IV bolus contrast injection. Subsequent parametric perfusion maps were calculated using RAPID software. CONTRAST:  139m OMNIPAQUE IOHEXOL 350 MG/ML SOLN COMPARISON:  Prior head CT from earlier same day. FINDINGS: CTA NECK FINDINGS Aortic arch: Examination technically limited by timing of the contrast bolus, with poor opacification of the arterial system. Visualized aortic arch of normal caliber with normal branch pattern. Moderate atherosclerotic change about the arch and origin of the great vessels without hemodynamically significant stenosis. Visualized subclavian arteries widely patent. Right carotid system: Right CCA tortuous proximally but is widely patent to the bifurcation without flow-limiting stenosis. Extensive bulky calcified plaque about the right bifurcation/proximal right ICA with resultant severe stenosis of at least 75% by NASCET criteria. Right ICA otherwise patent distally to the skull base without stenosis, dissection, or occlusion. Left carotid system: Left CCA mildly tortuous but patent to the bifurcation without flow-limiting stenosis. Extensive bulky plaque about the left bifurcation/proximal left ICA with resultant severe stenosis of up to left ICA otherwise  patent to the skull base without stenosis, dissection or occlusion. Approximately 80% by NASCET criteria. Vertebral arteries: Both vertebral arteries arise from the subclavian arteries. Atheromatous plaque at the origin of the vertebral arteries bilaterally with estimated 50-75% ostial stenosis bilaterally, left greater than right. Left vertebral artery slightly dominant. Vertebral arteries otherwise patent within the neck without stenosis, dissection, or occlusion. Skeleton: No acute osseous abnormality. No discrete or worrisome osseous lesions. Exaggeration of the normal thoracic kyphosis noted. Degenerative changes noted about the left TMJ. Other neck: No other acute soft tissue abnormality within the neck. No mass lesion or adenopathy. Upper chest: Mild scattered atelectatic changes noted within the visualized lungs. Underlying emphysema. Cardiomegaly with scattered coronary artery calcifications partially visualized. Visualized upper chest demonstrates no other acute finding. Review of the MIP images confirms the above findings CTA HEAD FINDINGS Anterior circulation: Examination of the intracranial circulation technically limited by timing of the contrast bolus. Petrous segments patent bilaterally. Extensive calcified plaque throughout the carotid siphons with associated moderate to severe diffuse narrowing. ICA termini well perfused. A1 segments patent bilaterally. Normal anterior communicating artery. Anterior cerebral arteries patent to their distal aspects  without stenosis. No M1 stenosis or occlusion. Normal MCA bifurcations. Distal MCA branches well perfused and symmetric. Posterior circulation: Mild nonstenotic plaque noted within the proximal left V4 segment without significant stenosis. Left vertebral otherwise widely patent to the vertebrobasilar junction. Focal plaque within the proximal right V4 segment with short-segment mild to moderate stenosis. Left PICA patent. Right PICA not well seen. Basilar  patent to its distal aspect without stenosis. Superior cerebral arteries patent bilaterally. Both PCAs primarily supplied via the basilar. PCAs well perfused to their distal aspects without stenosis. Venous sinuses: Not well assessed due to timing of the contrast bolus. Anatomic variants: None significant. Review of the MIP images confirms the above findings CT Brain Perfusion Findings: ASPECTS: 10 CT perfusion portion of this exam failed due to poor timing of the contrast bolus. Study is nondiagnostic for evaluation of acute ischemia or perfusion abnormality. IMPRESSION: CTA HEAD AND NECK IMPRESSION: 1. Technically limited exam due to timing of the contrast bolus. 2. Negative CTA for emergent large vessel occlusion. 3. Heavy atheromatous plaque about the carotid bifurcations with associated stenoses of up to at least 75-80% by NASCET criteria bilaterally, left slightly worse than right. 4. Atheromatous plaque at the origins of both vertebral arteries with estimated moderate to severe 50-75% stenoses bilaterally. Left vertebral artery dominant. 5. Extensive calcified plaque throughout the carotid siphons with associated moderate to severe diffuse narrowing. 6. Aortic Atherosclerosis (ICD10-I70.0) and Emphysema (ICD10-J43.9). CT PERFUSION IMPRESSION: CT perfusion portion of this exam failed due to poor timing of the contrast bolus. Examination is nondiagnostic for evaluation of possible ischemia or other perfusion abnormality. Electronically Signed   By: Jeannine Boga M.D.   On: 07/02/2019 20:27   DG Chest Port 1 View  Result Date: 07/01/2019 CLINICAL DATA:  Shortness of breath. EXAM: PORTABLE CHEST 1 VIEW COMPARISON:  December 26, 2018. FINDINGS: Stable cardiomediastinal silhouette. No pneumothorax or pleural effusion is noted. Both lungs are clear. The visualized skeletal structures are unremarkable. IMPRESSION: No active disease. Electronically Signed   By: Marijo Conception M.D.   On: 07/01/2019 15:15     ECHOCARDIOGRAM COMPLETE  Result Date: 07/02/2019    ECHOCARDIOGRAM REPORT   Patient Name:   BORA BRONER Dobratz Date of Exam: 07/02/2019 Medical Rec #:  [id number]     Height:       65.0 in Accession #:    [id number]    Weight:       147.9 lb Date of Birth:  April 14, 1921      BSA:          1.740 m Patient Age:    89 years      BP:           130/56 mmHg Patient Gender: M             HR:           40 bpm. Exam Location:  [facility] Procedure: 2D Echo Indications:    CHF-Acute Systolic 469.62 / X52.84  History:        Patient has prior history of Echocardiogram examinations, most                 recent 12/11/2018. CAD and Previous Myocardial Infarction,                 Arrythmias:Atrial Fibrillation, Signs/Symptoms:Dyspnea; Risk                 Factors:Dyslipidemia, Hypertension and Former Smoker. NICM ,  Bradycardia.  Sonographer:    Leavy Cella RDCS (AE) Referring Phys: [place]  1. Left ventricular ejection fraction, by estimation, is 35 to 40%. The left ventricle has moderately decreased function. The left ventricle demonstrates regional wall motion abnormalities (see scoring diagram/findings for description). Left ventricular  diastolic parameters are indeterminate.  2. Right ventricular systolic function is moderately reduced. The right ventricular size is normal. There is moderately elevated pulmonary artery systolic pressure. The estimated right ventricular systolic pressure is 88.3 mmHg.  3. Left atrial size was mildly dilated.  4. The mitral valve is degenerative. Mild mitral valve regurgitation.  5. The aortic valve is tricuspid, moderately calcified with restricted motion of the left coronary cusp. Aortic valve regurgitation is not visualized.  6. The inferior vena cava is normal in size with greater than 50% respiratory variability, suggesting right atrial pressure of 3 mmHg. FINDINGS  Left Ventricle: Left ventricular ejection fraction, by estimation, is 35 to 40%. The  left ventricle has moderately decreased function. The left ventricle demonstrates regional wall motion abnormalities. The left ventricular internal cavity size was normal in size. There is borderline left ventricular hypertrophy. Left ventricular diastolic parameters are indeterminate.  LV Wall Scoring: The basal inferior segment is akinetic. The mid inferior segment is hypokinetic. Right Ventricle: The right ventricular size is normal. No increase in right ventricular wall thickness. Right ventricular systolic function is moderately reduced. There is moderately elevated pulmonary artery systolic pressure. The tricuspid regurgitant velocity is 3.66 m/s, and with an assumed right atrial pressure of 3 mmHg, the estimated right ventricular systolic pressure is 25.4 mmHg. Left Atrium: Left atrial size was mildly dilated. Right Atrium: Right atrial size was normal in size. Pericardium: There is no evidence of pericardial effusion. Mitral Valve: The mitral valve is degenerative in appearance. Moderate mitral annular calcification. Mild mitral valve regurgitation. Tricuspid Valve: The tricuspid valve is grossly normal. Tricuspid valve regurgitation is mild. Aortic Valve: The aortic valve is tricuspid. Aortic valve regurgitation is not visualized. Moderate aortic valve annular calcification. There is moderate calcification of the aortic valve. Pulmonic Valve: The pulmonic valve was not well visualized. Pulmonic valve regurgitation is not visualized. Aorta: The aortic root is normal in size and structure. Venous: The inferior vena cava is normal in size with greater than 50% respiratory variability, suggesting right atrial pressure of 3 mmHg. IAS/Shunts: No atrial level shunt detected by color flow Doppler.  LEFT VENTRICLE PLAX 2D LVIDd:         5.14 cm  Diastology LVIDs:         3.31 cm  LV e' lateral:   5.11 cm/s LV PW:         1.08 cm  LV E/e' lateral: 18.5 LV IVS:        1.06 cm  LV e' medial:    3.15 cm/s LVOT diam:      2.20 cm  LV E/e' medial:  29.9 LVOT Area:     3.80 cm  RIGHT VENTRICLE RV S prime:     8.05 cm/s TAPSE (M-mode): 1.2 cm LEFT ATRIUM             Index LA diam:        3.60 cm 2.07 cm/m LA Vol (A2C):   61.0 ml 35.05 ml/m LA Vol (A4C):   56.7 ml 32.58 ml/m LA Biplane Vol: 60.6 ml 34.82 ml/m   AORTA Ao Root diam: 3.30 cm MITRAL VALVE  TRICUSPID VALVE MV Area (PHT): 2.55 cm     TR Peak grad:   53.6 mmHg MV Decel Time: 297 msec     TR Vmax:        366.00 cm/s MV E velocity: 94.30 cm/s MV A velocity: 123.00 cm/s  SHUNTS MV E/A ratio:  0.77         Systemic Diam: 2.20 cm Rozann Lesches MD Electronically signed by Rozann Lesches MD Signature Date/Time: 07/02/2019/2:59:42 PM    Final    CT HEAD CODE STROKE WO CONTRAST`  Addendum Date: 07/02/2019   ADDENDUM REPORT: 07/02/2019 18:33 ADDENDUM: Study discussed by telephone with Dr. Roxan Hockey on 07/02/2019 at Rew hours. Electronically Signed   By: Genevie Ann M.D.   On: 07/02/2019 18:33   Result Date: 07/02/2019 CLINICAL DATA:  Code stroke. 86 year old male with sudden change in behavior, now mostly nonresponsive. EXAM: CT HEAD WITHOUT CONTRAST TECHNIQUE: Contiguous axial images were obtained from the base of the skull through the vertex without intravenous contrast. COMPARISON:  Head CT 12/21/2018. FINDINGS: Brain: Stable cerebral volume. No ventriculomegaly. Cavum septum pellucidum, normal variant. 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 Stable gray-white matter differentiation throughout the brain. No acute intracranial hemorrhage identified. No midline shift, mass effect, or evidence of intracranial mass lesion. No cortically based acute infarct identified. Vascular: Extensive Calcified atherosclerosis at the skull base.6 No suspicious intracranial vascular hyperdensity. [id number] Skull: No acute osseous abnormality identified. Sinuses/Orbits: Stable paranasal sinuses since last year. Mastoids remain clear. Other: No acute  orbit or scalp soft tissue finding. ASPECTS [facility] Stroke Program Early CT Score) Total score (0-10 with 10 being normal): 10 IMPRESSION: 1. Stable non contrast CT appearance of the brain since 2020. No acute cortically based infarct or acute intracranial hemorrhage identified. ASPECTS 10. 2. Chronic small vessel ischemia in the left corona radiata and basal ganglia. Electronically Signed: By: Genevie Ann M.D. On: 07/02/2019 18:27     CBC Recent Labs  Lab 07/01/19 1452 07/02/19 0443 07/02/19 1247 07/03/19 0523  WBC 14.3* 12.5* 11.3* 10.9*  HGB 13.7 13.0 12.3* 11.7*  HCT 43.5 41.7 39.5 37.9*  PLT 158 131* 120* 119*  MCV 87.7 89.5 88.8 89.2  MCH 27.6 27.9 27.6 27.5  MCHC 31.5 31.2 31.1 30.9  RDW 15.0 15.5 15.3 15.7*  LYMPHSABS 1.1  --  0.8  --   MONOABS 0.8  --  0.8  --   EOSABS 0.0  --  0.0  --   BASOSABS 0.0  --  0.0  --     Chemistries  Recent Labs  Lab 07/01/19 1452 07/02/19 0443 07/02/19 1247 07/03/19 0523  NA 141 147* 144 143  K 3.9 4.1 4.4 4.3  CL 103 108 106 108  CO2 27 27 29 26   GLUCOSE 101* 103* 117* 142*  BUN 55* 52* 54* 55*  CREATININE 2.50* 2.22* 2.26* 1.94*  CALCIUM 9.5 9.5 9.1 8.8*  MG  --  2.4  --   --   AST 24  --   --   --   ALT 16  --   --   --   ALKPHOS 157*  --   --   --   BILITOT 0.9  --   --   --   ------------------------------------------------------------------------------------------------------------------ No results for input(s): CHOL, HDL, LDLCALC, TRIG, CHOLHDL, LDLDIRECT in the last 72 hours.  Lab Results  Component Value Date   HGBA1C 5.8 (H) 11/28/2015   ------------------------------------------------------------------------------------------------------------------ No results for input(s): TSH,  T4TOTAL, T3FREE, THYROIDAB in the last 72 hours.  Invalid input(s): FREET3 ------------------------------------------------------------------------------------------------------------------ No results for input(s): VITAMINB12, FOLATE,  FERRITIN, TIBC, IRON, RETICCTPCT in the last 72 hours.  Coagulation profile Recent Labs  Lab 07/01/19 1452 07/02/19 0443 07/03/19 0523  INR 2.0* 2.1* 2.9*    No results for input(s): DDIMER in the last 72 hours.  Cardiac Enzymes No results for input(s): CKMB, TROPONINI, MYOGLOBIN in the last 168 hours.  Invalid input(s): CK ------------------------------------------------------------------------------------------------------------------    Component Value Date/Time   BNP 1,193.0 (H) 07/02/2019 1247    Roxan Hockey M.D on 07/03/2019 at 5:23 PM  Go to [email or web address] - for contact info  [facility] - Office  [phone]

## 2019-07-03 NOTE — Progress Notes (Signed)
MD Courage states to hold all three b/p meds at this time. Pt's son advised and stated understanding.

## 2019-07-04 LAB — PROTIME-INR
INR: 3.9 — ABNORMAL HIGH (ref 0.8–1.2)
Prothrombin Time: 38.2 seconds — ABNORMAL HIGH (ref 11.4–15.2)

## 2019-07-04 LAB — GLUCOSE, CAPILLARY: Glucose-Capillary: 108 mg/dL — ABNORMAL HIGH (ref 70–99)

## 2019-07-04 MED ORDER — PREDNISONE 20 MG PO TABS
50.0000 mg | ORAL_TABLET | Freq: Once | ORAL | Status: AC
  Administered 2019-07-04: 50 mg via ORAL
  Filled 2019-07-04: qty 1

## 2019-07-04 MED ORDER — DEXTROSE 5 % IV SOLN: INTRAVENOUS | Status: DC

## 2019-07-04 MED ORDER — LEVETIRACETAM IN NACL 1000 MG/100ML IV SOLN
1000.0000 mg | Freq: Once | INTRAVENOUS | Status: AC
  Administered 2019-07-04: 1000 mg via INTRAVENOUS
  Filled 2019-07-04: qty 100

## 2019-07-04 MED ORDER — LORAZEPAM 2 MG/ML IJ SOLN
0.5000 mg | Freq: Once | INTRAMUSCULAR | Status: AC
  Administered 2019-07-04: 0.5 mg via INTRAVENOUS
  Filled 2019-07-04: qty 1

## 2019-07-04 NOTE — Evaluation (Signed)
Physical Therapy Evaluation Patient Details Name: Scott [name] MRN: [id number] DOB: [birth year] Today's Date: 07/04/2019   History of Present Illness  Scott [name] is a 86 y.o. male with medical history significant of DNR/DNI, hx of a fib on coumadin, CAD s/p MI 2019, left BKA, diabetes, presented to the hospital with fever, lethargy, and shortness of breath.  His caretaker at the bedside reports that for the past 3 days or so the patient has been complaining about shortness of breath.  They have noted that his home O2 sat has been dropping as low as 72% on room air.  Normally the patient is 93 to 94% on room air.  They feel like he was improving with albuterol treatments at home, but today he maintained sats in the 80s.  Patient never complained about chest pain.  They noted that he might be febrile today.  Patient himself appears extremely tired on exam and cannot provide me any other history.    Clinical Impression  Patient presents very lethargy and requires Max verbal/tactile cueing to participate.  Patient limited to sitting at bedside and unable to scoot laterally or attempt sit to stands due to lethargy/weakness.   Patient put back to bed with Max assistance to reposition.  Patient will benefit from continued physical therapy in hospital and recommended venue below to increase strength, balance, endurance for safe ADLs and gait.    Follow Up Recommendations SNF;Supervision for mobility/OOB;Supervision/Assistance - 24 hour    Equipment Recommendations  None recommended by PT    Recommendations for Other Services       Precautions / Restrictions Precautions Precautions: Fall Restrictions Weight Bearing Restrictions: No      Mobility  Bed Mobility Overal bed mobility: Needs Assistance Bed Mobility: Supine to Sit;Sit to Supine     Supine to sit: Max assist Sit to supine: Max assist   General bed mobility comments: slow labored movement  Transfers                     Ambulation/Gait                Stairs            Wheelchair Mobility    Modified Rankin (Stroke Patients Only)       Balance Overall balance assessment: Needs assistance Sitting-balance support: Feet supported;Bilateral upper extremity supported Sitting balance-Leahy Scale: Poor Sitting balance - Comments: seated at EOB                                     Pertinent Vitals/Pain Pain Assessment: No/denies pain    Home Living Family/patient expects to be discharged to:: Private residence Living Arrangements: Alone Available Help at Discharge: Personal care attendant;Available 24 hours/day Type of Home: House Home Access: Ramped entrance     Home Layout: One level Home Equipment: Wheelchair - power;Walker - 2 wheels;Shower seat;Shower seat - built in;Bedside commode Additional Comments: Pt reports PCA 24 hrs/day    Prior Function Level of Independence: Needs assistance   Gait / Transfers Assistance Needed: Non-ambulatory, uses power wheelchair for mobility, Mod Indep stand pivot transfers  ADL's / Homemaking Assistance Needed: assisted for bathing, dressing and household ADLs by paid attendants 24/7        Hand Dominance   Dominant Hand: Right    Extremity/Trunk Assessment   Upper Extremity Assessment Upper Extremity Assessment: Generalized weakness  Lower Extremity Assessment Lower Extremity Assessment: Generalized weakness    Cervical / Trunk Assessment Cervical / Trunk Assessment: Kyphotic  Communication   Communication: HOH  Cognition Arousal/Alertness: Lethargic;Suspect due to medications Behavior During Therapy: Flat affect Overall Cognitive Status: Impaired/Different from baseline Area of Impairment: Attention;Following commands                       Following Commands: Follows one step commands with increased time;Follows multi-step commands inconsistently       General Comments: very lethargic       General Comments      Exercises     Assessment/Plan    PT Assessment Patient needs continued PT services  PT Problem List Decreased strength;Decreased activity tolerance;Decreased balance;Decreased mobility       PT Treatment Interventions Balance training;Functional mobility training;Therapeutic activities;Patient/family education;Wheelchair mobility training    PT Goals (Current goals can be found in the Care Plan section)  Acute Rehab PT Goals Patient Stated Goal: not stated PT Goal Formulation: With patient Time For Goal Achievement: 07/18/19 Potential to Achieve Goals: Good    Frequency Min 3X/week   Barriers to discharge        Co-evaluation               AM-PAC PT "6 Clicks" Mobility  Outcome Measure Help needed turning from your back to your side while in a flat bed without using bedrails?: A Lot Help needed moving from lying on your back to sitting on the side of a flat bed without using bedrails?: A Lot Help needed moving to and from a bed to a chair (including a wheelchair)?: Total Help needed standing up from a chair using your arms (e.g., wheelchair or bedside chair)?: Total Help needed to walk in hospital room?: Total Help needed climbing 3-5 steps with a railing? : Total 6 Click Score: 8    End of Session   Activity Tolerance: Patient limited by lethargy;Patient limited by fatigue Patient left: in bed;with call bell/phone within reach;with family/visitor present Nurse Communication: Mobility status PT Visit Diagnosis: Unsteadiness on feet (R26.81);Muscle weakness (generalized) (M62.81);History of falling (Z91.81);Difficulty in walking, not elsewhere classified (R26.2)    Time: [id number] PT Time Calculation (min) (ACUTE ONLY): 21 min   Charges:   PT Evaluation $PT Eval Moderate Complexity: 1 Mod PT Treatments $Therapeutic Activity: 8-22 mins        2:26 PM, 07/04/19 Lonell Grandchild, MPT Physical Therapist with [facility] [phone] office [phone] mobile phone

## 2019-07-04 NOTE — Progress Notes (Signed)
ANTICOAGULATION CONSULT NOTE -   Pharmacy Consult for warfarin dosing  Indication: atrial fibrillation   Allergies  Allergen Reactions  . Lipitor [Atorvastatin] Other (See Comments)    Unknown rxn per pt      Patient Measurements: Last Weight  Most recent update: 07/03/2019  6:18 AM   Weight  67.5 kg (148 lb 13 oz)           Body mass index is 24.76 kg/m. Scott [name]               Temp: 97.5 F (36.4 C) (03/24 0034) Temp Source: Oral (03/24 0034) BP: 123/67 (03/24 0558) Pulse Rate: 64 (03/24 0558)  Labs: Recent Labs    07/01/19 1452 07/01/19 1452 07/02/19 0443 07/02/19 0443 07/02/19 1247 07/03/19 0523 07/04/19 0526  HGB 13.7   < > 13.0   < > 12.3* 11.7*  --   HCT 43.5   < > 41.7  --  39.5 37.9*  --   PLT 158   < > 131*  --  120* 119*  --   APTT 39*  --   --   --   --   --   --   LABPROT 22.4*   < > 23.7*  --   --  30.0* 38.2*  INR 2.0*   < > 2.1*  --   --  2.9* 3.9*  CREATININE 2.50*   < > 2.22*  --  2.26* 1.94*  --    < > = values in this interval not displayed.    Estimated Creatinine Clearance: 19.8 mL/min (A) (by C-G formula based on SCr of 1.94 mg/dL (H)).     Medications:  Medications Prior to Admission  Medication Sig Dispense Refill Last Dose  . acetaminophen (TYLENOL) 325 MG tablet Take 2 tablets (650 mg total) by mouth every 6 (six) hours as needed for mild pain (or Fever >/= 101).   06/30/2019 at 1900  . albuterol (PROVENTIL) (2.5 MG/3ML) 0.083% nebulizer solution NEBULIZE 1 VIAL EVERY 6 HOURS AS NEEDED FOR WHEEZING OR SHORTNESS OF BREATH (Patient taking differently: Take 2.5 mg by nebulization every 6 (six) hours as needed for wheezing or shortness of breath. ) 180 mL 3 07/01/2019 at Unknown time  . albuterol (VENTOLIN HFA) 108 (90 Base) MCG/ACT inhaler Inhale 2 puffs into the lungs every 6 (six) hours as needed for wheezing or shortness of breath. 18 g 3 07/01/2019 at Unknown time  . aspirin EC 81 MG EC tablet Take 1 tablet (81 mg total) by mouth  daily.   06/30/2019 at 0930  . carvedilol (COREG) 6.25 MG tablet TAKE  (1)  TABLET TWICE A DAY. (Patient taking differently: Take 6.25 mg by mouth 2 (two) times daily with a meal. ) 60 tablet 0 06/30/2019 at 1900  . COMBIVENT RESPIMAT 20-100 MCG/ACT AERS respimat 1 PUFF EVERY 4 HOURS AS NEEDED FOR COUGH, WHEEZING, OR SHORTNESS OF BREATH 4 g 0 06/30/2019 at Unknown time  . donepezil (ARICEPT) 10 MG tablet TAKE ONE TABLET AT BEDTIME 30 tablet 0 06/30/2019 at Unknown time  . febuxostat (ULORIC) 40 MG tablet TAKE 1 TABLET DAILY 30 tablet 0 06/30/2019 at Unknown time  . fluticasone (FLONASE) 50 MCG/ACT nasal spray Place 1 spray into both nostrils daily as needed for allergies or rhinitis. (Patient taking differently: Place 1 spray into both nostrils at bedtime. ) 16 g 2 06/30/2019 at Unknown time  . folic acid (FOLVITE) 1 MG tablet TAKE 1 TABLET DAILY 30  tablet 2 06/30/2019 at Unknown time  . furosemide (LASIX) 40 MG tablet TAKE 1 TABLET 2 TIMES A DAY 60 tablet 0 06/30/2019 at Unknown time  . HYDROcodone-acetaminophen (NORCO/VICODIN) 5-325 MG tablet Take 1 tablet by mouth 2 (two) times daily as needed for moderate pain. 60 tablet 0 Past Month at Unknown time  . ipratropium (ATROVENT) 0.02 % nebulizer solution Take 0.5 mg by nebulization every 6 (six) hours as needed for wheezing or shortness of breath.   06/30/2019 at Unknown time  . MYRBETRIQ 25 MG TB24 tablet Take 25 mg by mouth every morning.    06/30/2019 at Unknown time  . pantoprazole (PROTONIX) 40 MG tablet TAKE 1 TABLET DAILY 90 tablet 0 06/30/2019 at Unknown time  . sertraline (ZOLOFT) 50 MG tablet TAKE 1 TABLET IN THE MORNING 30 tablet 2 06/30/2019 at Unknown time  . simvastatin (ZOCOR) 10 MG tablet TAKE 1 TABLET ONCE DAILY IN THE EVENING (Patient taking differently: Take 10 mg by mouth daily at 6 PM. ) 90 tablet 0 06/30/2019 at Unknown time  . warfarin (COUMADIN) 2.5 MG tablet Take 2.5 mg by mouth daily except Mondays and Fridays take 5 mg 40 tablet 2  06/30/2019 at 0930   Scheduled:  . aspirin EC  81 mg Oral Daily  . azithromycin  250 mg Oral Daily  . carvedilol  6.25 mg Oral BID WC  . Chlorhexidine Gluconate Cloth  6 each Topical Daily  . donepezil  10 mg Oral QHS  . febuxostat  40 mg Oral Daily  . fluticasone  1 spray Each Nare QHS  . folic acid  1 mg Oral Daily  . furosemide  40 mg Intravenous BID  . gabapentin  200 mg Oral BID  . guaiFENesin  600 mg Oral BID  . isosorbide dinitrate  20 mg Oral TID   And  . hydrALAZINE  37.5 mg Oral TID  . mouth rinse  15 mL Mouth Rinse BID  . mirabegron ER  25 mg Oral q morning - 10a  . pantoprazole  40 mg Oral Daily  . potassium chloride  10 mEq Oral BID  . sertraline  50 mg Oral q morning - 10a  . simvastatin  10 mg Oral q1800  . sodium chloride flush  3 mL Intravenous Q12H  . Warfarin - Pharmacist Dosing Inpatient   Does not apply q1600   Infusions:  . sodium chloride    . sodium chloride 100 mL/hr at 07/03/19 0542   PRN: sodium chloride, acetaminophen, HYDROcodone-acetaminophen, ipratropium-albuterol, LORazepam, ondansetron (ZOFRAN) IV, sodium chloride flush Anti-infectives (From admission, onward)   Start     Dose/Rate Route Frequency Ordered Stop   07/02/19 1600  azithromycin (ZITHROMAX) tablet 250 mg     250 mg Oral Daily 07/01/19 1812 07/06/19 1559   07/02/19 1500  cefTRIAXone (ROCEPHIN) 1 g in sodium chloride 0.9 % 100 mL IVPB  Status:  Discontinued     1 g 200 mL/hr over 30 Minutes Intravenous Every 24 hours 07/01/19 1812 07/03/19 1812   07/01/19 1500  cefTRIAXone (ROCEPHIN) 1 g in sodium chloride 0.9 % 100 mL IVPB     1 g 200 mL/hr over 30 Minutes Intravenous  Once 07/01/19 1452 07/01/19 1549   07/01/19 1500  azithromycin (ZITHROMAX) 500 mg in sodium chloride 0.9 % 250 mL IVPB  Status:  Discontinued     500 mg 250 mL/hr over 60 Minutes Intravenous Every 24 hours 07/01/19 1452 07/01/19 2048      Goal of Therapy:  INR 2-3 Monitor platelets by anticoagulation protocol:  Yes    Prior to Admission Warfarin Dosing:  Scott [name] takes 2.5mg  of warfarin everyday of the week except MF, takes 5mg      INR was 2.1 Lab Results  Component Value Date   INR 3.9 (H) 07/04/2019   INR 2.9 (H) 07/03/2019   INR 2.1 (H) 07/02/2019    Assessment: Scott [name] a 86 y.o. male requires anticoagulation with warfarin for the indication of  atrial fibrillation. Warfarin will be initiated inpatient following pharmacy protocol per pharmacy consult. Patient most recent blood work is as follows: CBC Latest Ref Rng & Units 07/03/2019 07/02/2019 07/02/2019  WBC 4.0 - 10.5 K/uL 10.9(H) 11.3(H) 12.5(H)  Hemoglobin 13.0 - 17.0 g/dL 11.7(L) 12.3(L) 13.0  Hematocrit 39.0 - 52.0 % 37.9(L) 39.5 41.7  Platelets 150 - 400 K/uL 119(L) 120(L) 131(L)     Plan: INR 2.1 > 2.9>3.9. Hold warfarin again. Monitor CBC MWF with am labs   Monitor INR daily Monitor for signs and symptoms of bleeding   Margot Ables, PharmD Clinical Pharmacist 07/04/2019 7:49 AM

## 2019-07-04 NOTE — Progress Notes (Signed)
Scott A. Merlene Laughter, MD     [email or web address]          Scott [name] is an 86 y.o. male.   ASSESSMENT/PLAN: 1.  Fluctuating level of arousal possibly related to ceftriaxone, this has been discontinued.  The patient also could be having complex partial seizure but he does not have a history of this and EEG has been unrevealing.   Currently he is not on Keppra. He is on moderate dose of gabapentin for his age. One possibility would be to increase this to treat his pain and also any possible seizures. 2.  Community-acquired pneumonia ??? 3.  Remote left basal ganglia infarct 4.  Atrial fibrillation 5.  Cognitive impairment    The patient's son is at the bedside. The son reports that he has been drowsy all day long. He actually seems to be more responsive this even which is similar to what the nurses reported to me. Patient was given Ativan and this may explain the drowsiness today. No reported evidence of a fluctuating level of consciousness however. The patient has been off Ceftriaxone.   GENERAL: This is a thin somewhat frail-appearing male in no acute distress.  Slight wheezings are noted but he is not dyspneic or tachypneic.  He is on 2 L oxygen.  HEENT:  Neck is suppl, no trauma appreciated.  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  BACK: Normal  SKIN: Normal by inspection.    MENTAL STATUS: He lays in bed with eyes closed.  He does open his eyes to he lays in bed verbal commands and spontaneously.  He follows commands well.  He knows that he is in the hospital but thinks he is in [place].  He states the year is 2021 but the month is May.  CRANIAL NERVES: The left eye is somewhat clouded with nonreactive pupil.  The right is mid position approximately 5 mm and reactive.  Extraocular movements are intact.  There is no significant nystagmus; visual fields are full; upper and lower facial muscles are normal in  strength and symmetric, there is no flattening of the nasolabial folds; tongue is midline; uvula is midline; shoulder elevation is normal.  MOTOR: He has about 4/strength in the upper extremities.  Bulk and tone are normal.  He has antigravity strength in the legs.  COORDINATION: Left finger to nose is normal, right finger to nose is normal, No rest tremor; no intention tremor; no postural tremor; no bradykinesia.  REFLEXES: Deep tendon reflexes are symmetrical and normal.   SENSATION: Normal to pain.    Blood pressure 119/69, pulse 66, temperature (!) 97.3 F (36.3 C), temperature source Oral, resp. rate 18, height 5\' 5"  (1.651 m), weight 67.5 kg, SpO2 100 %.  Past Medical History:  Diagnosis Date  . Arthritis    "all over"  . Atrial fibrillation ([place])   . Benign localized prostatic hyperplasia with lower urinary tract symptoms (LUTS)   . Bradycardia 11/28/2015   Severe-HR in the 20s to 30s following intubation for urological procedure.  Marland Kitchen CAD- non obstructive disease by cath 3/14 cardiologist-  dr hochrein  . Chronic lower back pain   . COPD (chronic obstructive pulmonary disease) ([place])   . DDD (degenerative disc disease)   . Depression   . Dyspnea    w/ exertion and lying down (raise head)  . ETOH abuse   . GERD (gastroesophageal reflux disease)   . Glaucoma   . Glaucoma, both eyes   .  History of bladder cancer urologist-- dr Jeffie Pollock   first dx 2012--  s/p TURBT's  . History of DVT of lower extremity 03/2013   bilateral   . History of pulmonary embolus (PE) 04/2013  . HTN (hypertension)   . Hyperlipidemia   . Hypertension   . LBBB (left bundle branch block)    chronic  . Non-ischemic cardiomyopathy ([place])    last echo 01-18-2017, ef 35-40%  . NSVT (nonsustained ventricular tachycardia) ([place])    a. NSVT 06/2012; NSVT also seen during 03/2013 adm. b. Med rx. Not candidate for ICD given adv age.  Marland Kitchen PAD (peripheral artery disease) ([place])   . Popliteal artery aneurysm,  bilateral (H[facility])    DOCUMENTED CHRONIC PARTIAL OCCLUSION--  PT DENIES CLAUDICATION OR ANY OTHER SYMPTOMS  . PVCs (premature ventricular contractions)   . PVD (peripheral vascular disease) ([facility])    Right ABI .75, Left .78 (2006)  . Syncope 06/2012   a. Felt to be postural syncope related to diuretics 06/2012.  Marland Kitchen Systolic and diastolic CHF, chronic (HCC) cardiologsit-  dr hochrein   a. NICM - patent cors 06/2012, EF 40% at that time. b. 03/2013 eval: EF 20-25%.  . Vision loss, left eye    due to glaucoma  . Wears glasses     Past Surgical History:  Procedure Laterality Date  . AMPUTATION Left 10/16/2014   Procedure: AMPUTATION ABOVE KNEE- LEFT;  Surgeon: Mal Misty, MD;  Location: [facility];  Service: Vascular;  Laterality: Left;  . CARDIAC CATHETERIZATION  05-11-2004  DR [facility]   MINIMAL CORANARY PLAQUE/ NORMAL LVF/ EF 55%/  NON-OBSTRUCTIVE LAD 25%  . CARDIAC CATHETERIZATION  06/18/2012   dr hochrein   mild luminal irregularities of coronaries, ef 45-50%  . CARDIOVASCULAR STRESS TEST  10-15-2010   LOW RISK NUCLEAR STUDY/ NO EVIDENCE OF ISCHEMIA/ NORMAL EF  . CATARACT EXTRACTION W/ INTRAOCULAR LENS  IMPLANT, BILATERAL Bilateral   . CYSTOSCOPY  10/07/2011   Procedure: CYSTOSCOPY;  Surgeon: Malka So, MD;  Location: [facility];  Service: Urology;  Laterality: N/A;  . CYSTOSCOPY N/A 10/20/2018   Procedure: CYSTOSCOPY WITH FULGURATION OF PROSTATIC URETHRAL TUMOR;  Surgeon: [name], MD;  Location: [facility];  Service: Urology;  Laterality: N/A;  . CYSTOSCOPY WITH BIOPSY  03/14/2012   Procedure: CYSTOSCOPY WITH BIOPSY;  Surgeon: Malka So, MD;  Location: [facility];  Service: Urology;  Laterality: N/A;  WITH FULGURATION  . CYSTOSCOPY WITH BIOPSY N/A 12/09/2016   Procedure: CYSTOSCOPY WITH BIOPSY AND FULGURATION;  Surgeon: [name], MD;  Location: [facility];  Service: Urology;  Laterality: N/A;  . CYSTOSCOPY WITH BIOPSY N/A 12/20/2017   Procedure: CYSTOSCOPY WITH BIOPSY WITH  FULGURATION;  Surgeon: [name], MD;  Location[facility];  Service: Urology;  Laterality: N/A;  . CYSTOSCOPY WITH BIOPSY N/A 06/09/2018   Procedure: CYSTOSCOPY WITH BIOPSY AND FULGURATION;  Surgeon: [name], MD;  Location: [facility];  Service: Urology;  Laterality: N/A;  . CYSTOSCOPY WITH FULGERATION N/A 01/20/2016   Procedure: CYSTOSCOPY, BIOPSY WITH FULGERATION OF URTHRAL TUMOR;  Surgeon: [name], MD;  [facility];  Service: Urology;  Laterality: N/A;  . CYSTOSCOPY WITH FULGERATION N/A 06/01/2016   Procedure: CYSTOSCOPY WITH FULGERATION urethral tumors and bladder neck;  Surgeon: [name], MD;  Location[facility];  Service: Urology;  Laterality: N/A;  . SHOULDER SURGERY Left 1970's  . TONSILLECTOMY    . TRANSTHORACIC ECHOCARDIOGRAM  01-18-2017   dr hochrein   moderate LVH, ef  35-40%, diffuse hypokinesis/  trivial AR and MR/  mild TR  . TRANSURETHRAL RESECTION OF BLADDER TUMOR  10/07/2011   Procedure: TRANSURETHRAL RESECTION OF BLADDER TUMOR (TURBT);  Surgeon: Malka So, MD;  Location: [facility];  Service: Urology;  Laterality: N/A;  . TRANSURETHRAL RESECTION OF BLADDER TUMOR N/A 07/10/2015   Procedure: TRANSURETHRAL RESECTION OF BLADDER TUMOR (TURBT), CYSTOSCOPY ;  Surgeon: [name], MD;  Location[facility];  Service: Urology;  Laterality: N/A;  . TRANSURETHRAL RESECTION OF PROSTATE  03/14/2012   Procedure: TRANSURETHRAL RESECTION OF THE PROSTATE WITH GYRUS INSTRUMENTS;  Surgeon: Malka So, MD;  [facility];  Service: Urology;  Laterality: N/A;    Family History  Problem Relation Age of Onset  . Hypertension Mother   . Arthritis Mother   . Lung cancer Sister   . Deep vein thrombosis Father   . Early death Brother     Social History:  reports that he quit smoking about 44 years ago. His smoking use included cigarettes. He started smoking about 77 years ago. He has a 35.00 pack-year smoking history. He has never used smokeless tobacco. He reports previous  alcohol use of about 7.0 standard drinks of alcohol per week. He reports that he does not use drugs.  Allergies:  Allergies  Allergen Reactions  . Lipitor [Atorvastatin] Other (See Comments)    Unknown rxn per pt    Medications: Prior to Admission medications   Medication Sig Start Date End Date Taking? Authorizing Provider  acetaminophen (TYLENOL) 325 MG tablet Take 2 tablets (650 mg total) by mouth every 6 (six) hours as needed for mild pain (or Fever >/= 101). 12/26/18  Yes Elgergawy, Silver Huguenin, MD  albuterol (PROVENTIL) (2.5 MG/3ML) 0.083% nebulizer solution NEBULIZE 1 VIAL EVERY 6 HOURS AS NEEDED FOR WHEEZING OR SHORTNESS OF BREATH Patient taking differently: Take 2.5 mg by nebulization every 6 (six) hours as needed for wheezing or shortness of breath.  09/07/18  Yes Dettinger, Fransisca Kaufmann, MD  albuterol (VENTOLIN HFA) 108 (90 Base) MCG/ACT inhaler Inhale 2 puffs into the lungs every 6 (six) hours as needed for wheezing or shortness of breath. 05/17/19  Yes Dettinger, Fransisca Kaufmann, MD  aspirin EC 81 MG EC tablet Take 1 tablet (81 mg total) by mouth daily. 12/12/18  Yes Tat, [name], MD  carvedilol (COREG) 6.25 MG tablet TAKE  (1)  TABLET TWICE A DAY. Patient taking differently: Take 6.25 mg by mouth 2 (two) times daily with a meal.  06/22/19  Yes Dettinger, Fransisca Kaufmann, MD  COMBIVENT RESPIMAT 20-100 MCG/ACT AERS respimat 1 PUFF EVERY 4 HOURS AS NEEDED FOR COUGH, WHEEZING, OR SHORTNESS OF BREATH 06/28/19  Yes Dettinger, Fransisca Kaufmann, MD  donepezil (ARICEPT) 10 MG tablet TAKE ONE TABLET AT BEDTIME 04/26/19  Yes Dettinger, Fransisca Kaufmann, MD  febuxostat (ULORIC) 40 MG tablet TAKE 1 TABLET DAILY 04/26/19  Yes Dettinger, Fransisca Kaufmann, MD  fluticasone (FLONASE) 50 MCG/ACT nasal spray Place 1 spray into both nostrils daily as needed for allergies or rhinitis. Patient taking differently: Place 1 spray into both nostrils at bedtime.  09/07/18  Yes Dettinger, Fransisca Kaufmann, MD  folic acid (FOLVITE) 1 MG tablet TAKE 1 TABLET DAILY 05/24/19   Yes Dettinger, Fransisca Kaufmann, MD  furosemide (LASIX) 40 MG tablet TAKE 1 TABLET 2 TIMES A DAY 06/28/19  Yes Dettinger, Fransisca Kaufmann, MD  HYDROcodone-acetaminophen (NORCO/VICODIN) 5-325 MG tablet Take 1 tablet by mouth 2 (two) times daily as needed for moderate pain. 01/30/19  Yes Dettinger, Fransisca Kaufmann, MD  ipratropium (ATROVENT) 0.02 % nebulizer solution Take 0.5 mg by nebulization every 6 (six) hours as needed for wheezing or shortness of breath.   Yes [provider]  MYRBETRIQ 25 MG TB24 tablet Take 25 mg by mouth every morning.  09/20/16  Yes [provider]  pantoprazole (PROTONIX) 40 MG tablet TAKE 1 TABLET DAILY 05/16/19  Yes Dettinger, Fransisca Kaufmann, MD  sertraline (ZOLOFT) 50 MG tablet TAKE 1 TABLET IN THE MORNING 05/24/19  Yes Dettinger, Fransisca Kaufmann, MD  simvastatin (ZOCOR) 10 MG tablet TAKE 1 TABLET ONCE DAILY IN THE EVENING Patient taking differently: Take 10 mg by mouth daily at 6 PM.  05/15/19  Yes Dettinger, Fransisca Kaufmann, MD  warfarin (COUMADIN) 2.5 MG tablet Take 2.5 mg by mouth daily except Mondays and Fridays take 5 mg 05/24/19  Yes Dettinger, Fransisca Kaufmann, MD  BIDIL 20-37.5 MG tablet TAKE 1 TABLET BY MOUTH 3 TIMES DAILY. 07/03/19   Dettinger, Fransisca Kaufmann, MD  gabapentin (NEURONTIN) 100 MG capsule TAKE 2 CAPSULES TWICE A DAY 07/03/19   Dettinger, Fransisca Kaufmann, MD    Scheduled Meds: . aspirin EC  81 mg Oral Daily  . azithromycin  250 mg Oral Daily  . carvedilol  6.25 mg Oral BID WC  . Chlorhexidine Gluconate Cloth  6 each Topical Daily  . donepezil  10 mg Oral QHS  . febuxostat  40 mg Oral Daily  . fluticasone  1 spray Each Nare QHS  . folic acid  1 mg Oral Daily  . furosemide  40 mg Intravenous BID  . gabapentin  200 mg Oral BID  . guaiFENesin  600 mg Oral BID  . isosorbide dinitrate  20 mg Oral TID   And  . hydrALAZINE  37.5 mg Oral TID  . LORazepam  0.5 mg Intravenous Once  . mouth rinse  15 mL Mouth Rinse BID  . mirabegron ER  25 mg Oral q morning - 10a  . pantoprazole  40 mg Oral Daily    . potassium chloride  10 mEq Oral BID  . sertraline  50 mg Oral q morning - 10a  . simvastatin  10 mg Oral q1800  . sodium chloride flush  3 mL Intravenous Q12H  . Warfarin - Pharmacist Dosing Inpatient   Does not apply q1600   Continuous Infusions: . sodium chloride    . dextrose 10 mL/hr at 07/04/19 1934   PRN Meds:.sodium chloride, acetaminophen, HYDROcodone-acetaminophen, ipratropium-albuterol, LORazepam, ondansetron (ZOFRAN) IV, sodium chloride flush     Results for orders placed or performed during the hospital encounter of 07/01/19 (from the past 48 hour(s))  MRSA PCR Screening     Status: None   Collection Time: 07/02/19  7:51 PM   Specimen: Nasal Mucosa; Nasopharyngeal  Result Value Ref Range   MRSA by PCR NEGATIVE NEGATIVE    Comment:        The GeneXpert MRSA Assay (FDA approved for NASAL specimens only), is one component of a comprehensive MRSA colonization surveillance program. It is not intended to diagnose MRSA infection nor to guide or monitor treatment for MRSA infections. Performed at [facility], [address], [place], [place] [address]   Protime-INR     Status: Abnormal   Collection Time: 07/03/19  5:23 AM  Result Value Ref Range   Prothrombin Time 30.0 (H) 11.4 - 15.2 seconds   INR 2.9 (H) 0.8 - 1.2    Comment: (NOTE) INR goal varies based on device and disease  states. Performed at [facility], [address] [place], [place] [address]   CBC     Status: Abnormal   Collection Time: 07/03/19  5:23 AM  Result Value Ref Range   WBC 10.9 (H) 4.0 - 10.5 K/uL   RBC 4.25 4.22 - 5.81 MIL/uL   Hemoglobin 11.7 (L) 13.0 - 17.0 g/dL   HCT 37.9 (L) 39.0 - 52.0 %   MCV 89.2 80.0 - 100.0 fL   MCH 27.5 26.0 - 34.0 pg   MCHC 30.9 30.0 - 36.0 g/dL   RDW 15.7 (H) 11.5 - 15.5 %   Platelets 119 (L) 150 - 400 K/uL    Comment: SPECIMEN CHECKED FOR CLOTS Immature Platelet Fraction may be clinically indicated, consider ordering this additional  test [id number] CONSISTENT WITH PREVIOUS RESULT    nRBC 0.0 0.0 - 0.2 %    Comment: Performed at [facility], [address] [place], [place] [id number]  Basic metabolic panel     Status: Abnormal   Collection Time: 07/03/19  5:23 AM  Result Value Ref Range   Sodium 143 135 - 145 mmol/L   Potassium 4.3 3.5 - 5.1 mmol/L   Chloride 108 98 - 111 mmol/L   CO2 26 22 - 32 mmol/L   Glucose, Bld 142 (H) 70 - 99 mg/dL    Comment: Glucose reference range applies only to samples taken after fasting for at least 8 hours.   BUN 55 (H) 8 - 23 mg/dL   Creatinine, Ser 1.94 (H) 0.61 - 1.24 mg/dL   Calcium 8.8 (L) 8.9 - 10.3 mg/dL   GFR calc non Af Amer 29 (L) >60 mL/min   GFR calc Af Amer 33 (L) >60 mL/min   Anion gap 9 5 - 15    Comment: Performed at [facility], [address] [place], [place] [address]  Ammonia     Status: None   Collection Time: 07/03/19  5:23 AM  Result Value Ref Range   Ammonia 12 9 - 35 umol/L    Comment: Performed at [facility], [address] [place], [place] [address]  Protime-INR     Status: Abnormal   Collection Time: 07/04/19  5:26 AM  Result Value Ref Range   Prothrombin Time 38.2 (H) 11.4 - 15.2 seconds   INR 3.9 (H) 0.8 - 1.2    Comment: (NOTE) INR goal varies based on device and disease states. Performed at [facility], [address], [place], [place] [address]   Glucose, capillary     Status: Abnormal   Collection Time: 07/04/19  6:21 PM  Result Value Ref Range   Glucose-Capillary 108 (H) 70 - 99 mg/dL    Comment: Glucose reference range applies only to samples taken after fasting for at least 8 hours.   *Note: Due to a large number of results and/or encounters for the requested time period, some results have not been displayed. A complete set of results can be found in Results Review.    Studies/Results:  EEG Technical aspects: This EEG study was done with scalp electrodes positioned according to the 10-20 International system of electrode  placement. Electrical activity was acquired at a sampling rate of 500Hz  and reviewed with a high frequency filter of 70Hz  and a low frequency filter of 1Hz . EEG data were recorded continuously and digitally stored.   Description: No clear posterior dominant rhythm was seen. Sleep was characterized by vertex waves, maximal frontocentral region.  EEG showed continuous generalized 6-8 Hz theta-alpha activity.  Hyperventilation and photic stimulation were not performed.    Abnormality - Continuous slow, generalized  IMPRESSION: This study is suggestive of mild to moderate diffuse encephalopathy. No seizures or epileptiform discharges were seen throughout the recording.    HEAD NECK CTA - PERFUSION   FINDINGS: CTA NECK FINDINGS  Aortic arch: Examination technically limited by timing of the contrast bolus, with poor opacification of the arterial system.  Visualized aortic arch of normal caliber with normal branch pattern. Moderate atherosclerotic change about the arch and origin of the great vessels without hemodynamically significant stenosis. Visualized subclavian arteries widely patent.  Right carotid system: Right CCA tortuous proximally but is widely patent to the bifurcation without flow-limiting stenosis. Extensive bulky calcified plaque about the right bifurcation/proximal right ICA with resultant severe stenosis of at least 75% by NASCET criteria. Right ICA otherwise patent distally to the skull base without stenosis, dissection, or occlusion.  Left carotid system: Left CCA mildly tortuous but patent to the bifurcation without flow-limiting stenosis. Extensive bulky plaque about the left bifurcation/proximal left ICA with resultant severe stenosis of up to left ICA otherwise patent to the skull base without stenosis, dissection or occlusion. Approximately 80% by NASCET criteria.  Vertebral arteries: Both vertebral arteries arise from the subclavian arteries.  Atheromatous plaque at the origin of the vertebral arteries bilaterally with estimated 50-75% ostial stenosis bilaterally, left greater than right. Left vertebral artery slightly dominant. Vertebral arteries otherwise patent within the neck without stenosis, dissection, or occlusion.  Skeleton: No acute osseous abnormality. No discrete or worrisome osseous lesions. Exaggeration of the normal thoracic kyphosis noted. Degenerative changes noted about the left TMJ.  Other neck: No other acute soft tissue abnormality within the neck. No mass lesion or adenopathy.  Upper chest: Mild scattered atelectatic changes noted within the visualized lungs. Underlying emphysema. Cardiomegaly with scattered coronary artery calcifications partially visualized. Visualized upper chest demonstrates no other acute finding.  Review of the MIP images confirms the above findings  CTA HEAD FINDINGS  Anterior circulation: Examination of the intracranial circulation technically limited by timing of the contrast bolus. Petrous segments patent bilaterally. Extensive calcified plaque throughout the carotid siphons with associated moderate to severe diffuse narrowing. ICA termini well perfused. A1 segments patent bilaterally. Normal anterior communicating artery. Anterior cerebral arteries patent to their distal aspects without stenosis. No M1 stenosis or occlusion. Normal MCA bifurcations. Distal MCA branches well perfused and symmetric.  Posterior circulation: Mild nonstenotic plaque noted within the proximal left V4 segment without significant stenosis. Left vertebral otherwise widely patent to the vertebrobasilar junction. Focal plaque within the proximal right V4 segment with short-segment mild to moderate stenosis. Left PICA patent. Right PICA not well seen. Basilar patent to its distal aspect without stenosis. Superior cerebral arteries patent bilaterally. Both PCAs primarily supplied via the  basilar. PCAs well perfused to their distal aspects without stenosis.  Venous sinuses: Not well assessed due to timing of the contrast bolus.  Anatomic variants: None significant.  Review of the MIP images confirms the above findings  CT Brain Perfusion Findings:  ASPECTS: 10  CT perfusion portion of this exam failed due to poor timing of the contrast bolus. Study is nondiagnostic for evaluation of acute ischemia or perfusion abnormality.  IMPRESSION: CTA HEAD AND NECK IMPRESSION:  1. Technically limited exam due to timing of the contrast bolus. 2. Negative CTA for emergent large vessel occlusion. 3. Heavy atheromatous plaque about the carotid bifurcations with associated stenoses of up to at least 75-80% by NASCET criteria bilaterally,  left slightly worse than right. 4. Atheromatous plaque at the origins of both vertebral arteries with estimated moderate to severe 50-75% stenoses bilaterally. Left vertebral artery dominant. 5. Extensive calcified plaque throughout the carotid siphons with associated moderate to severe diffuse narrowing. 6. Aortic Atherosclerosis (ICD10-I70.0) and Emphysema (ICD10-J43.9).  CT PERFUSION IMPRESSION:  CT perfusion portion of this exam failed due to poor timing of the contrast bolus. Examination is nondiagnostic for evaluation of possible ischemia or other perfusion abnormality.    BRAIN MRI FINDINGS: Brain: No restricted diffusion or evidence of acute infarction. Chronic lacunar type infarct of the left corona radiata and extending to the left deep gray nuclei.  Occasional superimposed small areas of chronic cortical encephalomalacia such as the right superior frontal gyrus on series 8, image 42, pre motor area. Scattered other white matter T2 and FLAIR hyperintensity, although generally mild for age. There are occasional chronic micro hemorrhages suspected (left periatrial white matter series 9, image 12). Brainstem and  cerebellum appear largely negative.  Cavum septum pellucidum, normal variant. No midline shift, mass effect, evidence of mass lesion, ventriculomegaly, extra-axial collection or acute intracranial hemorrhage. Cervicomedullary junction and pituitary are within normal limits.  Vascular: Major intracranial vascular flow voids is are preserved.  Skull and upper cervical spine: Degenerative ligamentous hypertrophy about the odontoid. Visualized bone marrow signal is within normal limits.  Sinuses/Orbits: Postoperative changes to both globes, otherwise negative orbits. Paranasal sinus mucosal thickening is stable and most pronounced in the left maxillary sinus.  Other: Mastoid air cells are well pneumatized. Limited detail of the internal auditory structures. Scalp and face soft tissues appear grossly negative.  IMPRESSION: 1. No acute intracranial abnormality. 2. Chronic small vessel ischemia, most pronounced in the left corona Radiata.    The brain MRI is reviewed in person.  No acute changes are noted on DWI. No hemorrhages noted. There is marked global atrophy consistent with his age. No hemorrhage is appreciated. There is a moderate size remote infarct involving the left basal ganglia extending rostrally to the centrum semiovale.   Scott [name] A. Merlene [name], M.D.  Diplomate, Tax adviser of Psychiatry and Neurology ( Neurology). 07/04/2019, 7:35 PM

## 2019-07-04 NOTE — Progress Notes (Signed)
Patient Demographics:    Scott [name], is a 86 y.o. male, DOB - [birth year], ALP:[id number]  Admit date - 07/01/2019   Admitting Physician Neena Rhymes, MD  Outpatient Primary MD for the patient is Dettinger, Fransisca Kaufmann, MD  LOS - 3   Chief Complaint  Patient presents with  . Altered Mental Status        Subjective:    Scott [name] today has no  emesis,    --No further episodes of prolonged unresponsiveness or seizure type concerns -Patient did receive Keppra and Ativan overnight   Assessment  & Plan :    Principal Problem:   Sepsis due to other etiology [facility]) Active Problems:   UTI (urinary tract infection)   Paroxysmal atrial fibrillation (HCC)   Acute combined systolic (congestive) and diastolic (congestive) heart failure (HCC)   Benign localized hyperplasia of prostate with urinary obstruction   Chronic combined systolic and diastolic heart failure (HCC)   NICM (nonischemic cardiomyopathy)- EF 20-25% by Echo 04/12/13   Essential hypertension   Chronic kidney disease (CKD) stage G3a/A1, moderately decreased glomerular filtration rate (GFR) between 45-59 mL/min/1.73 square meter and albuminuria creatinine ratio less than 30 mg/g (HCC)   Goals of care, counseling/discussion   Palliative care encounter  Brief Summary:- 86 y.o. 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 fib, hypertension, chronic kidney disease stage III, COPD, bladder cancer, CAD, left AKA admitted with fevers and sepsis pathophysiology on 07/01/2019 with concerns about urinary source -Now with concerns about possible seizures  A/p 1)Sepsis of unknown source ---on admission pt met sepsis criteria with fevers, tachycardia , tachypnea , Leukocytosis and Hypoxia  -WBC is down to 10.9 from 14.3 however anticipate further rising WBC with initiation of steroid therapy  for COPD -Patient was treated with Rocephin (started 07/01/19) through 07/03/19 --Rocephin was discontinued due to concerns about contribution to was possible seizures -UA suspicious for possible UTI however patient has history of bladder cancer and BPH -Chest x-ray without pneumonia, however patient has wheezing and hypoxia consistent with COPD -blood and urine cultures NGTD  2)HFrEF--- patient with combined diastolic and systolic dysfunction CHF last known EF 35 to 40%, repeat echo pending--- chest x-ray without overt CHF exacerbation, BNP elevated at 1507 (current BNP level which are similar to patient's prior) -Patient with wheezing, shortness of breath and hypoxia--more consistent with COPD exacerbation rather CHF exacerbation -Troponins are elevated peaking at 2252--- trended down, -Patient with history of previous elevated troponins -Await echocardiogram and if there is a significant drop in EF or other significant changes from prior will consider cardiology consult -Given concerns about sepsis avoid over aggressive diuresis  switched from IV Lasix to p.o. Lasix -Repeat chest x-ray on 07/03/2019 without acute findings  3)Acute COPD Exacerbation- no definite pneumonia,  C/n  IV Solu-Medrol 40 mg every 12 hours, c/n  mucolytics, antibiotics (as above in # 1)  and bronchodilators as ordered, supplemental oxygen as ordered.   4)Possible Seizures Vs Tia-----patient had episode of "unresponsiveness" concerning for seizures on 07/02/2019 and again on 07/03/2019----caregiver Remo Lipps took several videos while patient was unresponsive ---My serial exam leaves me suspicious for possible seizures ---No jerky or grand mal type activity-   Has intermittent  episodes of unresponsiveness followed by severe lethargy-suspect post ictal somnolence -- stroke work-up including MRI brain, CT head and CTA head and neck essentially negative -EEG without acute findings --Patient received IV Keppra on 07/03/2019 and  again on 07/04/2019 -As per neurologist okay to stop Keppra -May use IV lorazepam as needed seizures -Neurology consult from Dr. Merlene Laughter appreciated --Rocephin was discontinued due to concerns about contribution to was possible seizures  5)H/o PAFib--- on telemetry episodes of tachyarrhythmia --atrial tachycardia -Continue to keep magnesium and potassium in range -INR is up to 3.9, continue Coumadin anticoagulation -Continue Coreg for rate control at 6.25 mg twice daily  6)CKD stage -  IIIb---- appears at baseline,  creatinine on admission= 2.5 , baseline creatinine =  2.19 (01/08/19)  , creatinine is now=  1.94, renally adjust medications, avoid nephrotoxic agents / dehydration  / hypotension  7)H/o PAD/CAD--- stable, continue aspirin, continue Coreg, -continue simvastatin, continue isosorbide and hydralazine combo -Continue Coumadin therapy  8)History of DVT/PE -Continue Coumadin therapy  9)Dementia without behavioral disturbances -Continue Aricept and Zoloft,   10)Acute hypoxic respiratory failure secondary to #1 and #3 above--continue supplemental oxygen   Disposition/Need for in-Hospital Stay- patient unable to be discharged at this time due to --acute hypoxia and concerns about sepsis requiring IV antibiotics pending cultures ---Has intermittent episodes of unresponsiveness and severe lethargy with seizure concerns -Not medically ready for discharge however anticipate discharge home with home health when medically ready -Possible discharge home on 07/05/2019 if no further seizure episodes  Code Status : DNR  Family Communication:   (---- Discussed with caregiver [name] at bedside -Also discussed with patient's son at bedside  Consults  :  na  DVT Prophylaxis  : Coumadin therapy SCDs   Lab Results  Component Value Date   PLT 119 (L) 07/03/2019    Inpatient Medications  Scheduled Meds: . aspirin EC  81 mg Oral Daily  . azithromycin  250 mg Oral Daily  .  carvedilol  6.25 mg Oral BID WC  . Chlorhexidine Gluconate Cloth  6 each Topical Daily  . donepezil  10 mg Oral QHS  . febuxostat  40 mg Oral Daily  . fluticasone  1 spray Each Nare QHS  . folic acid  1 mg Oral Daily  . furosemide  40 mg Intravenous BID  . gabapentin  200 mg Oral BID  . guaiFENesin  600 mg Oral BID  . isosorbide dinitrate  20 mg Oral TID   And  . hydrALAZINE  37.5 mg Oral TID  . mouth rinse  15 mL Mouth Rinse BID  . mirabegron ER  25 mg Oral q morning - 10a  . pantoprazole  40 mg Oral Daily  . potassium chloride  10 mEq Oral BID  . sertraline  50 mg Oral q morning - 10a  . simvastatin  10 mg Oral q1800  . sodium chloride flush  3 mL Intravenous Q12H  . Warfarin - Pharmacist Dosing Inpatient   Does not apply q1600   Continuous Infusions: . sodium chloride    . sodium chloride 100 mL/hr at 07/03/19 0542   PRN Meds:.sodium chloride, acetaminophen, HYDROcodone-acetaminophen, ipratropium-albuterol, LORazepam, ondansetron (ZOFRAN) IV, sodium chloride flush    Anti-infectives (From admission, onward)   Start     Dose/Rate Route Frequency Ordered Stop   07/02/19 1600  azithromycin (ZITHROMAX) tablet 250 mg     250 mg Oral Daily 07/01/19 1812 07/06/19 1559   07/02/19 1500  cefTRIAXone (ROCEPHIN) 1 g in sodium chloride  0.9 % 100 mL IVPB  Status:  Discontinued     1 g 200 mL/hr over 30 Minutes Intravenous Every 24 hours 07/01/19 1812 07/03/19 1812   07/01/19 1500  cefTRIAXone (ROCEPHIN) 1 g in sodium chloride 0.9 % 100 mL IVPB     1 g 200 mL/hr over 30 Minutes Intravenous  Once 07/01/19 1452 07/01/19 1549   07/01/19 1500  azithromycin (ZITHROMAX) 500 mg in sodium chloride 0.9 % 250 mL IVPB  Status:  Discontinued     500 mg 250 mL/hr over 60 Minutes Intravenous Every 24 hours 07/01/19 1452 07/01/19 2048       Objective:   Vitals:   07/04/19 0034 07/04/19 0558 07/04/19 0729 07/04/19 1009  BP: 113/63 123/67  (!) 119/59  Pulse: 64 64  77  Resp: _0 Temp:  (!) 97.5 F (36.4 C)     TempSrc: Oral     SpO2: 100% 98% 97% 94%  Weight:      Height:        Wt Readings from Last 3 Encounters:  07/03/19 67.5 kg  01/26/19 86.2 kg  12/26/18 86.2 kg     Intake/Output Summary (Last 24 hours) at 07/04/2019 1205 Last data filed at 07/04/2019 0900 Gross per 24 hour  Intake 1852.21 ml  Output 1000 ml  Net 852.21 ml    Physical Exam Gen:- Awake Alert,  In no apparent distress HEENT:- [place].AT, No sclera icterus Neck-Supple Neck,No JVD,.  Lungs-improving air movement, no wheezing CV- S1, S2 normal, irregularly irregular, tachycardic Abd-  +ve B.Sounds, Abd Soft, No tenderness, no CVA tenderness Extremity/Skin:- No  edema, Rt pedal pulses present, Lt AKA Psych-affect is appropriate, oriented x3 Neuro-generalized weakness no new focal deficits, no tremors   Data Review:   Micro Results Recent Results (from the past 240 hour(s))  Blood Culture (routine x 2)     Status: None (Preliminary result)   Collection Time: 07/01/19  2:52 PM   Specimen: BLOOD RIGHT ARM  Result Value Ref Range Status   Specimen Description BLOOD RIGHT ARM  Final   Special Requests   Final    BOTTLES DRAWN AEROBIC AND ANAEROBIC Blood Culture results may not be optimal due to an inadequate volume of blood received in culture bottles   Culture   Final    NO GROWTH 3 DAYS Performed at [facility], [address] [place], [place] [address]    Report Status PENDING  Incomplete  Blood Culture (routine x 2)     Status: None (Preliminary result)   Collection Time: 07/01/19  3:15 PM   Specimen: BLOOD LEFT ARM  Result Value Ref Range Status   Specimen Description BLOOD LEFT ARM  Final   Special Requests   Final    BOTTLES DRAWN AEROBIC AND ANAEROBIC Blood Culture results may not be optimal due to an inadequate volume of blood received in culture bottles   Culture   Final    NO GROWTH 3 DAYS Performed at [facility], [address] [place], [place] [address]    Report  Status PENDING  Incomplete  SARS CORONAVIRUS 2 (TAT 6-24 HRS) Nasopharyngeal Nasopharyngeal Swab     Status: None   Collection Time: 07/01/19  4:21 PM   Specimen: Nasopharyngeal Swab  Result Value Ref Range Status   SARS Coronavirus 2 NEGATIVE NEGATIVE Final    Comment: (NOTE) SARS-CoV-2 target nucleic acids are NOT DETECTED. The SARS-CoV-2 RNA is generally detectable in upper and lower respiratory specimens during  the acute phase of infection. Negative results do not preclude SARS-CoV-2 infection, do not rule out co-infections with other pathogens, and should not be used as the sole basis for treatment or other patient management decisions. Negative results must be combined with clinical observations, patient history, and epidemiological information. The expected result is Negative. Fact Sheet for Patients: SugarRoll.be Fact Sheet for Healthcare Providers: https://www.woods-mathews.com/ This test is not yet approved or cleared by the [place] FDA and  has been authorized for detection and/or diagnosis of SARS-CoV-2 by FDA under an Emergency Use Authorization (EUA). This EUA will remain  in effect (meaning this test can be used) for the duration of the COVID-19 declaration under Section 56 4(b)(1) of the Act, 21 U.S.C. section 360bbb-3(b)(1), unless the authorization is terminated or revoked sooner. Performed at [facility], [place] [address], [place], [place] [address]   Urine culture     Status: None   Collection Time: 07/01/19 10:15 PM   Specimen: In/Out Cath Urine  Result Value Ref Range Status   Specimen Description   Final    IN/OUT CATH URINE Performed at [facility], [address] [place], [place] [address]    Special Requests   Final    NONE Performed at [facility], [address], [place], [place] [address]    Culture   Final    NO GROWTH Performed at [facility], [place] [address] [place], [place]  [address]    Report Status 07/03/2019 FINAL  Final  MRSA PCR Screening     Status: None   Collection Time: 07/02/19  7:51 PM   Specimen: Nasal Mucosa; Nasopharyngeal  Result Value Ref Range Status   MRSA by PCR NEGATIVE NEGATIVE Final    Comment:        The GeneXpert MRSA Assay (FDA approved for NASAL specimens only), is one component of a comprehensive MRSA colonization surveillance program. It is not intended to diagnose MRSA infection nor to guide or monitor treatment for MRSA infections. Performed at [facility], [address], [place], [place] [address]     Radiology Reports CT ANGIO HEAD W OR WO CONTRAST  Result Date: 07/02/2019 CLINICAL DATA:  Initial evaluation for acute altered mental status. EXAM: CT ANGIOGRAPHY HEAD AND NECK CT PERFUSION BRAIN TECHNIQUE: Multidetector CT imaging of the head and neck was performed using the standard protocol during bolus administration of intravenous contrast. Multiplanar CT image reconstructions and MIPs were obtained to evaluate the vascular anatomy. Carotid stenosis measurements (when applicable) are obtained utilizing NASCET criteria, using the distal internal carotid diameter as the denominator. Multiphase CT imaging of the brain was performed following IV bolus contrast injection. Subsequent parametric perfusion maps were calculated using RAPID software. CONTRAST:  132m OMNIPAQUE IOHEXOL 350 MG/ML SOLN COMPARISON:  Prior head CT from earlier same day. FINDINGS: CTA NECK FINDINGS Aortic arch: Examination technically limited by timing of the contrast bolus, with poor opacification of the arterial system. Visualized aortic arch of normal caliber with normal branch pattern. Moderate atherosclerotic change about the arch and origin of the great vessels without hemodynamically significant stenosis. Visualized subclavian arteries widely patent. Right carotid system: Right CCA tortuous proximally but is widely patent to the bifurcation without  flow-limiting stenosis. Extensive bulky calcified plaque about the right bifurcation/proximal right ICA with resultant severe stenosis of at least 75% by NASCET criteria. Right ICA otherwise patent distally to the skull base without stenosis, dissection, or occlusion. Left carotid system: Left CCA mildly tortuous but patent  to the bifurcation without flow-limiting stenosis. Extensive bulky plaque about the left bifurcation/proximal left ICA with resultant severe stenosis of up to left ICA otherwise patent to the skull base without stenosis, dissection or occlusion. Approximately 80% by NASCET criteria. Vertebral arteries: Both vertebral arteries arise from the subclavian arteries. Atheromatous plaque at the origin of the vertebral arteries bilaterally with estimated 50-75% ostial stenosis bilaterally, left greater than right. Left vertebral artery slightly dominant. Vertebral arteries otherwise patent within the neck without stenosis, dissection, or occlusion. Skeleton: No acute osseous abnormality. No discrete or worrisome osseous lesions. Exaggeration of the normal thoracic kyphosis noted. Degenerative changes noted about the left TMJ. Other neck: No other acute soft tissue abnormality within the neck. No mass lesion or adenopathy. Upper chest: Mild scattered atelectatic changes noted within the visualized lungs. Underlying emphysema. Cardiomegaly with scattered coronary artery calcifications partially visualized. Visualized upper chest demonstrates no other acute finding. Review of the MIP images confirms the above findings CTA HEAD FINDINGS Anterior circulation: Examination of the intracranial circulation technically limited by timing of the contrast bolus. Petrous segments patent bilaterally. Extensive calcified plaque throughout the carotid siphons with associated moderate to severe diffuse narrowing. ICA termini well perfused. A1 segments patent bilaterally. Normal anterior communicating artery. Anterior  cerebral arteries patent to their distal aspects without stenosis. No M1 stenosis or occlusion. Normal MCA bifurcations. Distal MCA branches well perfused and symmetric. Posterior circulation: Mild nonstenotic plaque noted within the proximal left V4 segment without significant stenosis. Left vertebral otherwise widely patent to the vertebrobasilar junction. Focal plaque within the proximal right V4 segment with short-segment mild to moderate stenosis. Left PICA patent. Right PICA not well seen. Basilar patent to its distal aspect without stenosis. Superior cerebral arteries patent bilaterally. Both PCAs primarily supplied via the basilar. PCAs well perfused to their distal aspects without stenosis. Venous sinuses: Not well assessed due to timing of the contrast bolus. Anatomic variants: None significant. Review of the MIP images confirms the above findings CT Brain Perfusion Findings: ASPECTS: 10 CT perfusion portion of this exam failed due to poor timing of the contrast bolus. Study is nondiagnostic for evaluation of acute ischemia or perfusion abnormality. IMPRESSION: CTA HEAD AND NECK IMPRESSION: 1. Technically limited exam due to timing of the contrast bolus. 2. Negative CTA for emergent large vessel occlusion. 3. Heavy atheromatous plaque about the carotid bifurcations with associated stenoses of up to at least 75-80% by NASCET criteria bilaterally, left slightly worse than right. 4. Atheromatous plaque at the origins of both vertebral arteries with estimated moderate to severe 50-75% stenoses bilaterally. Left vertebral artery dominant. 5. Extensive calcified plaque throughout the carotid siphons with associated moderate to severe diffuse narrowing. 6. Aortic Atherosclerosis (ICD10-I70.0) and Emphysema (ICD10-J43.9). CT PERFUSION IMPRESSION: CT perfusion portion of this exam failed due to poor timing of the contrast bolus. Examination is nondiagnostic for evaluation of possible ischemia or other perfusion  abnormality. Electronically Signed   By: Jeannine Boga M.D.   On: 07/02/2019 20:27   DG Chest 2 View  Result Date: 07/03/2019 CLINICAL DATA:  Dyspnea EXAM: CHEST - 2 VIEW COMPARISON:  07/01/2019 FINDINGS: Frontal and lateral views of the chest demonstrate a stable cardiac silhouette. There is persistent central vascular congestion. Chronic background interstitial scarring. No airspace disease, effusion, or pneumothorax. No acute bony abnormalities. IMPRESSION: 1. Chronic central vascular congestion. 2. Chronic interstitial lung disease. 3. No acute airspace disease. Electronically Signed   By: Randa Ngo M.D.   On: 07/03/2019 12:20   CT  ANGIO NECK W OR WO CONTRAST  Result Date: 07/02/2019 CLINICAL DATA:  Initial evaluation for acute altered mental status. EXAM: CT ANGIOGRAPHY HEAD AND NECK CT PERFUSION BRAIN TECHNIQUE: Multidetector CT imaging of the head and neck was performed using the standard protocol during bolus administration of intravenous contrast. Multiplanar CT image reconstructions and MIPs were obtained to evaluate the vascular anatomy. Carotid stenosis measurements (when applicable) are obtained utilizing NASCET criteria, using the distal internal carotid diameter as the denominator. Multiphase CT imaging of the brain was performed following IV bolus contrast injection. Subsequent parametric perfusion maps were calculated using RAPID software. CONTRAST:  122m OMNIPAQUE IOHEXOL 350 MG/ML SOLN COMPARISON:  Prior head CT from earlier same day. FINDINGS: CTA NECK FINDINGS Aortic arch: Examination technically limited by timing of the contrast bolus, with poor opacification of the arterial system. Visualized aortic arch of normal caliber with normal branch pattern. Moderate atherosclerotic change about the arch and origin of the great vessels without hemodynamically significant stenosis. Visualized subclavian arteries widely patent. Right carotid system: Right CCA tortuous proximally but  is widely patent to the bifurcation without flow-limiting stenosis. Extensive bulky calcified plaque about the right bifurcation/proximal right ICA with resultant severe stenosis of at least 75% by NASCET criteria. Right ICA otherwise patent distally to the skull base without stenosis, dissection, or occlusion. Left carotid system: Left CCA mildly tortuous but patent to the bifurcation without flow-limiting stenosis. Extensive bulky plaque about the left bifurcation/proximal left ICA with resultant severe stenosis of up to left ICA otherwise patent to the skull base without stenosis, dissection or occlusion. Approximately 80% by NASCET criteria. Vertebral arteries: Both vertebral arteries arise from the subclavian arteries. Atheromatous plaque at the origin of the vertebral arteries bilaterally with estimated 50-75% ostial stenosis bilaterally, left greater than right. Left vertebral artery slightly dominant. Vertebral arteries otherwise patent within the neck without stenosis, dissection, or occlusion. Skeleton: No acute osseous abnormality. No discrete or worrisome osseous lesions. Exaggeration of the normal thoracic kyphosis noted. Degenerative changes noted about the left TMJ. Other neck: No other acute soft tissue abnormality within the neck. No mass lesion or adenopathy. Upper chest: Mild scattered atelectatic changes noted within the visualized lungs. Underlying emphysema. Cardiomegaly with scattered coronary artery calcifications partially visualized. Visualized upper chest demonstrates no other acute finding. Review of the MIP images confirms the above findings CTA HEAD FINDINGS Anterior circulation: Examination of the intracranial circulation technically limited by timing of the contrast bolus. Petrous segments patent bilaterally. Extensive calcified plaque throughout the carotid siphons with associated moderate to severe diffuse narrowing. ICA termini well perfused. A1 segments patent bilaterally. Normal  anterior communicating artery. Anterior cerebral arteries patent to their distal aspects without stenosis. No M1 stenosis or occlusion. Normal MCA bifurcations. Distal MCA branches well perfused and symmetric. Posterior circulation: Mild nonstenotic plaque noted within the proximal left V4 segment without significant stenosis. Left vertebral otherwise widely patent to the vertebrobasilar junction. Focal plaque within the proximal right V4 segment with short-segment mild to moderate stenosis. Left PICA patent. Right PICA not well seen. Basilar patent to its distal aspect without stenosis. Superior cerebral arteries patent bilaterally. Both PCAs primarily supplied via the basilar. PCAs well perfused to their distal aspects without stenosis. Venous sinuses: Not well assessed due to timing of the contrast bolus. Anatomic variants: None significant. Review of the MIP images confirms the above findings CT Brain Perfusion Findings: ASPECTS: 10 CT perfusion portion of this exam failed due to poor timing of the contrast bolus. Study is nondiagnostic  for evaluation of acute ischemia or perfusion abnormality. IMPRESSION: CTA HEAD AND NECK IMPRESSION: 1. Technically limited exam due to timing of the contrast bolus. 2. Negative CTA for emergent large vessel occlusion. 3. Heavy atheromatous plaque about the carotid bifurcations with associated stenoses of up to at least 75-80% by NASCET criteria bilaterally, left slightly worse than right. 4. Atheromatous plaque at the origins of both vertebral arteries with estimated moderate to severe 50-75% stenoses bilaterally. Left vertebral artery dominant. 5. Extensive calcified plaque throughout the carotid siphons with associated moderate to severe diffuse narrowing. 6. Aortic Atherosclerosis (ICD10-I70.0) and Emphysema (ICD10-J43.9). CT PERFUSION IMPRESSION: CT perfusion portion of this exam failed due to poor timing of the contrast bolus. Examination is nondiagnostic for evaluation of  possible ischemia or other perfusion abnormality. Electronically Signed   By: Jeannine Boga M.D.   On: 07/02/2019 20:27   MR BRAIN WO CONTRAST  Result Date: 07/03/2019 CLINICAL DATA:  86 year old male code stroke presentation yesterday. Negative large vessel occlusion on CTA. EXAM: MRI HEAD WITHOUT CONTRAST TECHNIQUE: Multiplanar, multiecho pulse sequences of the brain and surrounding structures were obtained without intravenous contrast. COMPARISON:  CT head and CTA head and neck yesterday. FINDINGS: Brain: No restricted diffusion or evidence of acute infarction. Chronic lacunar type infarct of the left corona radiata and extending to the left deep gray nuclei. Occasional superimposed small areas of chronic cortical encephalomalacia such as the right superior frontal gyrus on series 8, image 42, pre motor area. Scattered other white matter T2 and FLAIR hyperintensity, although generally mild for age. There are occasional chronic micro hemorrhages suspected (left periatrial white matter series 9, image 12). Brainstem and cerebellum appear largely negative. Cavum septum pellucidum, normal variant. No midline shift, mass effect, evidence of mass lesion, ventriculomegaly, extra-axial collection or acute intracranial hemorrhage. Cervicomedullary junction and pituitary are within normal limits. Vascular: Major intracranial vascular flow voids is are preserved. Skull and upper cervical spine: Degenerative ligamentous hypertrophy about the odontoid. Visualized bone marrow signal is within normal limits. Sinuses/Orbits: Postoperative changes to both globes, otherwise negative orbits. Paranasal sinus mucosal thickening is stable and most pronounced in the left maxillary sinus. Other: Mastoid air cells are well pneumatized. Limited detail of the internal auditory structures. Scalp and face soft tissues appear grossly negative. IMPRESSION: 1. No acute intracranial abnormality. 2. Chronic small vessel ischemia, most  pronounced in the left corona radiata. Electronically Signed   By: Genevie Ann M.D.   On: 07/03/2019 18:17   CT CEREBRAL PERFUSION W CONTRAST  Result Date: 07/02/2019 CLINICAL DATA:  Initial evaluation for acute altered mental status. EXAM: CT ANGIOGRAPHY HEAD AND NECK CT PERFUSION BRAIN TECHNIQUE: Multidetector CT imaging of the head and neck was performed using the standard protocol during bolus administration of intravenous contrast. Multiplanar CT image reconstructions and MIPs were obtained to evaluate the vascular anatomy. Carotid stenosis measurements (when applicable) are obtained utilizing NASCET criteria, using the distal internal carotid diameter as the denominator. Multiphase CT imaging of the brain was performed following IV bolus contrast injection. Subsequent parametric perfusion maps were calculated using RAPID software. CONTRAST:  166m OMNIPAQUE IOHEXOL 350 MG/ML SOLN COMPARISON:  Prior head CT from earlier same day. FINDINGS: CTA NECK FINDINGS Aortic arch: Examination technically limited by timing of the contrast bolus, with poor opacification of the arterial system. Visualized aortic arch of normal caliber with normal branch pattern. Moderate atherosclerotic change about the arch and origin of the great vessels without hemodynamically significant stenosis. Visualized subclavian arteries widely patent. Right carotid  system: Right CCA tortuous proximally but is widely patent to the bifurcation without flow-limiting stenosis. Extensive bulky calcified plaque about the right bifurcation/proximal right ICA with resultant severe stenosis of at least 75% by NASCET criteria. Right ICA otherwise patent distally to the skull base without stenosis, dissection, or occlusion. Left carotid system: Left CCA mildly tortuous but patent to the bifurcation without flow-limiting stenosis. Extensive bulky plaque about the left bifurcation/proximal left ICA with resultant severe stenosis of up to left ICA otherwise  patent to the skull base without stenosis, dissection or occlusion. Approximately 80% by NASCET criteria. Vertebral arteries: Both vertebral arteries arise from the subclavian arteries. Atheromatous plaque at the origin of the vertebral arteries bilaterally with estimated 50-75% ostial stenosis bilaterally, left greater than right. Left vertebral artery slightly dominant. Vertebral arteries otherwise patent within the neck without stenosis, dissection, or occlusion. Skeleton: No acute osseous abnormality. No discrete or worrisome osseous lesions. Exaggeration of the normal thoracic kyphosis noted. Degenerative changes noted about the left TMJ. Other neck: No other acute soft tissue abnormality within the neck. No mass lesion or adenopathy. Upper chest: Mild scattered atelectatic changes noted within the visualized lungs. Underlying emphysema. Cardiomegaly with scattered coronary artery calcifications partially visualized. Visualized upper chest demonstrates no other acute finding. Review of the MIP images confirms the above findings CTA HEAD FINDINGS Anterior circulation: Examination of the intracranial circulation technically limited by timing of the contrast bolus. Petrous segments patent bilaterally. Extensive calcified plaque throughout the carotid siphons with associated moderate to severe diffuse narrowing. ICA termini well perfused. A1 segments patent bilaterally. Normal anterior communicating artery. Anterior cerebral arteries patent to their distal aspects without stenosis. No M1 stenosis or occlusion. Normal MCA bifurcations. Distal MCA branches well perfused and symmetric. Posterior circulation: Mild nonstenotic plaque noted within the proximal left V4 segment without significant stenosis. Left vertebral otherwise widely patent to the vertebrobasilar junction. Focal plaque within the proximal right V4 segment with short-segment mild to moderate stenosis. Left PICA patent. Right PICA not well seen. Basilar  patent to its distal aspect without stenosis. Superior cerebral arteries patent bilaterally. Both PCAs primarily supplied via the basilar. PCAs well perfused to their distal aspects without stenosis. Venous sinuses: Not well assessed due to timing of the contrast bolus. Anatomic variants: None significant. Review of the MIP images confirms the above findings CT Brain Perfusion Findings: ASPECTS: 10 CT perfusion portion of this exam failed due to poor timing of the contrast bolus. Study is nondiagnostic for evaluation of acute ischemia or perfusion abnormality. IMPRESSION: CTA HEAD AND NECK IMPRESSION: 1. Technically limited exam due to timing of the contrast bolus. 2. Negative CTA for emergent large vessel occlusion. 3. Heavy atheromatous plaque about the carotid bifurcations with associated stenoses of up to at least 75-80% by NASCET criteria bilaterally, left slightly worse than right. 4. Atheromatous plaque at the origins of both vertebral arteries with estimated moderate to severe 50-75% stenoses bilaterally. Left vertebral artery dominant. 5. Extensive calcified plaque throughout the carotid siphons with associated moderate to severe diffuse narrowing. 6. Aortic Atherosclerosis (ICD10-I70.0) and Emphysema (ICD10-J43.9). CT PERFUSION IMPRESSION: CT perfusion portion of this exam failed due to poor timing of the contrast bolus. Examination is nondiagnostic for evaluation of possible ischemia or other perfusion abnormality. Electronically Signed   By: Jeannine Boga M.D.   On: 07/02/2019 20:27   DG Chest Port 1 View  Result Date: 07/01/2019 CLINICAL DATA:  Shortness of breath. EXAM: PORTABLE CHEST 1 VIEW COMPARISON:  December 26, 2018. FINDINGS: Stable  cardiomediastinal silhouette. No pneumothorax or pleural effusion is noted. Both lungs are clear. The visualized skeletal structures are unremarkable. IMPRESSION: No active disease. Electronically Signed   By: Marijo Conception M.D.   On: 07/01/2019 15:15     EEG adult  Result Date: 07/03/2019 Lora Havens, MD     07/03/2019  6:59 PM Patient Name: KHYRI HINZMAN MRN: [id number] Epilepsy Attending: Lora Havens Referring Physician/Provider: Dr Roxan Hockey Date: 07/03/2019 Duration: 24.14 mins Patient history: 86 year old male with transient alteration of awareness.  EEG evaluate for seizures. Level of alertness: Awake, asleep AEDs during EEG study: Gabapentin Technical aspects: This EEG study was done with scalp electrodes positioned according to the 10-20 International system of electrode placement. Electrical activity was acquired at a sampling rate of 500Hz and reviewed with a high frequency filter of 70Hz and a low frequency filter of 1Hz. EEG data were recorded continuously and digitally stored. Description: No clear posterior dominant rhythm was seen. Sleep was characterized by vertex waves, maximal frontocentral region.  EEG showed continuous generalized 6-8 Hz theta-alpha activity.     Hyperventilation and photic stimulation were not performed.  Abnormality - Continuous slow, generalized IMPRESSION: This study is suggestive of mild to moderate diffuse encephalopathy. No seizures or epileptiform discharges were seen throughout the recording. Lora Havens   ECHOCARDIOGRAM COMPLETE  Result Date: 07/02/2019    ECHOCARDIOGRAM REPORT   Patient Name:   Scott [name] Date of Exam: 07/02/2019 Medical Rec #:  [id number]     Height:       65.0 in Accession #:    [id number]    Weight:       147.9 lb Date of Birth:  29-Aug-1921      BSA:          1.740 m Patient Age:    89 years      BP:           130/56 mmHg Patient Gender: M             HR:           40 bpm. Exam Location:  [facility] Procedure: 2D Echo Indications:    CHF-Acute Systolic 992.42 / A83.41  History:        Patient has prior history of Echocardiogram examinations, most                 recent 12/11/2018. CAD and Previous Myocardial Infarction,                 Arrythmias:Atrial Fibrillation,  Signs/Symptoms:Dyspnea; Risk                 Factors:Dyslipidemia, Hypertension and Former Smoker. NICM ,                 Bradycardia.  Sonographer:    Leavy Cella RDCS (AE) Referring Phys: [place]  1. Left ventricular ejection fraction, by estimation, is 35 to 40%. The left ventricle has moderately decreased function. The left ventricle demonstrates regional wall motion abnormalities (see scoring diagram/findings for description). Left ventricular  diastolic parameters are indeterminate.  2. Right ventricular systolic function is moderately reduced. The right ventricular size is normal. There is moderately elevated pulmonary artery systolic pressure. The estimated right ventricular systolic pressure is 96.2 mmHg.  3. Left atrial size was mildly dilated.  4. The mitral valve is degenerative. Mild mitral valve regurgitation.  5. The aortic valve is tricuspid, moderately calcified with restricted motion  of the left coronary cusp. Aortic valve regurgitation is not visualized.  6. The inferior vena cava is normal in size with greater than 50% respiratory variability, suggesting right atrial pressure of 3 mmHg. FINDINGS  Left Ventricle: Left ventricular ejection fraction, by estimation, is 35 to 40%. The left ventricle has moderately decreased function. The left ventricle demonstrates regional wall motion abnormalities. The left ventricular internal cavity size was normal in size. There is borderline left ventricular hypertrophy. Left ventricular diastolic parameters are indeterminate.  LV Wall Scoring: The basal inferior segment is akinetic. The mid inferior segment is hypokinetic. Right Ventricle: The right ventricular size is normal. No increase in right ventricular wall thickness. Right ventricular systolic function is moderately reduced. There is moderately elevated pulmonary artery systolic pressure. The tricuspid regurgitant velocity is 3.66 m/s, and with an assumed right atrial  pressure of 3 mmHg, the estimated right ventricular systolic pressure is 10.0 mmHg. Left Atrium: Left atrial size was mildly dilated. Right Atrium: Right atrial size was normal in size. Pericardium: There is no evidence of pericardial effusion. Mitral Valve: The mitral valve is degenerative in appearance. Moderate mitral annular calcification. Mild mitral valve regurgitation. Tricuspid Valve: The tricuspid valve is grossly normal. Tricuspid valve regurgitation is mild. Aortic Valve: The aortic valve is tricuspid. Aortic valve regurgitation is not visualized. Moderate aortic valve annular calcification. There is moderate calcification of the aortic valve. Pulmonic Valve: The pulmonic valve was not well visualized. Pulmonic valve regurgitation is not visualized. Aorta: The aortic root is normal in size and structure. Venous: The inferior vena cava is normal in size with greater than 50% respiratory variability, suggesting right atrial pressure of 3 mmHg. IAS/Shunts: No atrial level shunt detected by color flow Doppler.  LEFT VENTRICLE PLAX 2D LVIDd:         5.14 cm  Diastology LVIDs:         3.31 cm  LV e' lateral:   5.11 cm/s LV PW:         1.08 cm  LV E/e' lateral: 18.5 LV IVS:        1.06 cm  LV e' medial:    3.15 cm/s LVOT diam:     2.20 cm  LV E/e' medial:  29.9 LVOT Area:     3.80 cm  RIGHT VENTRICLE RV S prime:     8.05 cm/s TAPSE (M-mode): 1.2 cm LEFT ATRIUM             Index LA diam:        3.60 cm 2.07 cm/m LA Vol (A2C):   61.0 ml 35.05 ml/m LA Vol (A4C):   56.7 ml 32.58 ml/m LA Biplane Vol: 60.6 ml 34.82 ml/m   AORTA Ao Root diam: 3.30 cm MITRAL VALVE                TRICUSPID VALVE MV Area (PHT): 2.55 cm     TR Peak grad:   53.6 mmHg MV Decel Time: 297 msec     TR Vmax:        366.00 cm/s MV E velocity: 94.30 cm/s MV A velocity: 123.00 cm/s  SHUNTS MV E/A ratio:  0.77         Systemic Diam: 2.20 cm Rozann Lesches MD Electronically signed by Rozann Lesches MD Signature Date/Time: 07/02/2019/2:59:42 PM     Final    CT HEAD CODE STROKE WO CONTRAST`  Addendum Date: 07/02/2019   ADDENDUM REPORT: 07/02/2019 18:33 ADDENDUM: Study discussed by telephone with Dr. Roxan Hockey on  07/02/2019 at [place] hours. Electronically Signed   By: Genevie Ann M.D.   On: 07/02/2019 18:33   Result Date: 07/02/2019 CLINICAL DATA:  Code stroke. 86 year old male with sudden change in behavior, now mostly nonresponsive. EXAM: CT HEAD WITHOUT CONTRAST TECHNIQUE: Contiguous axial images were obtained from the base of the skull through the vertex without intravenous contrast. COMPARISON:  Head CT 12/21/2018. FINDINGS: Brain: Stable cerebral volume. No ventriculomegaly. Cavum septum pellucidum, normal variant. 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 Stable gray-white matter differentiation throughout the brain. No acute intracranial hemorrhage identified. No midline shift, mass effect, or evidence of intracranial mass lesion. No cortically based acute infarct identified. Vascular: Extensive Calcified atherosclerosis at the skull base.6 No suspicious intracranial vascular hyperdensity. [id number] Skull: No acute osseous abnormality identified. Sinuses/Orbits: Stable paranasal sinuses since last year. Mastoids remain clear. Other: No acute orbit or scalp soft tissue finding. ASPECTS [facility] Stroke Program Early CT Score) Total score (0-10 with 10 being normal): 10 IMPRESSION: 1. Stable non contrast CT appearance of the brain since 2020. No acute cortically based infarct or acute intracranial hemorrhage identified. ASPECTS 10. 2. Chronic small vessel ischemia in the left corona radiata and basal ganglia. Electronically Signed: By: Genevie Ann M.D. On: 07/02/2019 18:27     CBC Recent Labs  Lab 07/01/19 1452 07/02/19 0443 07/02/19 1247 07/03/19 0523  WBC 14.3* 12.5* 11.3* 10.9*  HGB 13.7 13.0 12.3* 11.7*  HCT 43.5 41.7 39.5 37.9*  PLT 158 131* 120* 119*  MCV 87.7 89.5 88.8 89.2  MCH 27.6 27.9 27.6 27.5  MCHC  31.5 31.2 31.1 30.9  RDW 15.0 15.5 15.3 15.7*  LYMPHSABS 1.1  --  0.8  --   MONOABS 0.8  --  0.8  --   EOSABS 0.0  --  0.0  --   BASOSABS 0.0  --  0.0  --     Chemistries  Recent Labs  Lab 07/01/19 1452 07/02/19 0443 07/02/19 1247 07/03/19 0523  NA 141 147* 144 143  K 3.9 4.1 4.4 4.3  CL 103 108 106 108  CO2 _0 GLUCOSE 101* 103* 117* 142*  BUN 55* 52* 54* 55*  CREATININE 2.50* 2.22* 2.26* 1.94*  CALCIUM 9.5 9.5 9.1 8.8*  MG  --  2.4  --   --   AST 24  --   --   --   ALT 16  --   --   --   ALKPHOS 157*  --   --   --   BILITOT 0.9  --   --   --   ------------------------------------------------------------------------------------------------------------------ No results for input(s): CHOL, HDL, LDLCALC, TRIG, CHOLHDL, LDLDIRECT in the last 72 hours.  Lab Results  Component Value Date   HGBA1C 5.8 (H) 11/28/2015   ------------------------------------------------------------------------------------------------------------------ No results for input(s): TSH, T4TOTAL, T3FREE, THYROIDAB in the last 72 hours.  Invalid input(s): FREET3 ------------------------------------------------------------------------------------------------------------------ No results for input(s): VITAMINB12, FOLATE, FERRITIN, TIBC, IRON, RETICCTPCT in the last 72 hours.  Coagulation profile Recent Labs  Lab 07/01/19 1452 07/02/19 0443 07/03/19 0523 07/04/19 0526  INR 2.0* 2.1* 2.9* 3.9*    No results for input(s): DDIMER in the last 72 hours.  Cardiac Enzymes No results for input(s): CKMB, TROPONINI, MYOGLOBIN in the last 168 hours.  Invalid input(s): CK ------------------------------------------------------------------------------------------------------------------    Component Value Date/Time   BNP 1,193.0 (H) 07/02/2019 1247    Roxan Hockey M.D on 07/04/2019 at 12:05 PM  Go to [email or web address] - for contact  info  [facility] - Office  [phone]

## 2019-07-04 NOTE — Care Management Important Message (Signed)
Important Message  Patient Details  Name: Scott [name] MRN: [id number] Date of Birth: 1921/04/17   Medicare Important Message Given:  Yes     [name] 07/04/2019, 2:09 PM

## 2019-07-05 LAB — CBC
HCT: 40.2 % (ref 39.0–52.0)
Hemoglobin: 12.1 g/dL — ABNORMAL LOW (ref 13.0–17.0)
MCH: 27.1 pg (ref 26.0–34.0)
MCHC: 30.1 g/dL (ref 30.0–36.0)
MCV: 90.1 fL (ref 80.0–100.0)
Platelets: 153 10*3/uL (ref 150–400)
RBC: 4.46 MIL/uL (ref 4.22–5.81)
RDW: 15.4 % (ref 11.5–15.5)
WBC: 9.3 10*3/uL (ref 4.0–10.5)
nRBC: 0 % (ref 0.0–0.2)

## 2019-07-05 LAB — PROTIME-INR
INR: 3.8 — ABNORMAL HIGH (ref 0.8–1.2)
Prothrombin Time: 37.4 seconds — ABNORMAL HIGH (ref 11.4–15.2)

## 2019-07-05 LAB — GLUCOSE, CAPILLARY
Glucose-Capillary: 107 mg/dL — ABNORMAL HIGH (ref 70–99)
Glucose-Capillary: 109 mg/dL — ABNORMAL HIGH (ref 70–99)
Glucose-Capillary: 118 mg/dL — ABNORMAL HIGH (ref 70–99)
Glucose-Capillary: 126 mg/dL — ABNORMAL HIGH (ref 70–99)

## 2019-07-05 LAB — BASIC METABOLIC PANEL
Anion gap: 10 (ref 5–15)
BUN: 56 mg/dL — ABNORMAL HIGH (ref 8–23)
CO2: 25 mmol/L (ref 22–32)
Calcium: 9.2 mg/dL (ref 8.9–10.3)
Chloride: 108 mmol/L (ref 98–111)
Creatinine, Ser: 1.37 mg/dL — ABNORMAL HIGH (ref 0.61–1.24)
GFR calc Af Amer: 50 mL/min — ABNORMAL LOW (ref >60)
GFR calc non Af Amer: 44 mL/min — ABNORMAL LOW (ref >60)
Glucose, Bld: 138 mg/dL — ABNORMAL HIGH (ref 70–99)
Potassium: 4.6 mmol/L (ref 3.5–5.1)
Sodium: 143 mmol/L (ref 135–145)

## 2019-07-05 MED ORDER — DEXTROSE-NACL 5-0.45 % IV SOLN: INTRAVENOUS | Status: AC

## 2019-07-05 NOTE — Progress Notes (Addendum)
Patient Demographics:    Scott [name], is a 86 y.o. male, DOB - 1921-12-24, PVV:[id number]  Admit date - 07/01/2019   Admitting Physician Neena Rhymes, MD  Outpatient Primary MD for the patient is Dettinger, Fransisca Kaufmann, MD  LOS - 4   Chief Complaint  Patient presents with  . Altered Mental Status        Subjective:    Scott [name] today has no  emesis,    --No further episodes of prolonged unresponsiveness or seizure type concerns --Patient son at bedside, questions answered -Able to take some ice cream -Able to answer some questions   Assessment  & Plan :    Principal Problem:   Sepsis due to other etiology [facility]) Active Problems:   UTI (urinary tract infection)   Paroxysmal atrial fibrillation (HCC)   Acute combined systolic (congestive) and diastolic (congestive) heart failure (HCC)   Benign localized hyperplasia of prostate with urinary obstruction   Chronic combined systolic and diastolic heart failure (HCC)   NICM (nonischemic cardiomyopathy)- EF 20-25% by Echo 04/12/13   Essential hypertension   Chronic kidney disease (CKD) stage G3a/A1, moderately decreased glomerular filtration rate (GFR) between 45-59 mL/min/1.73 square meter and albuminuria creatinine ratio less than 30 mg/g (HCC)   Goals of care, counseling/discussion   Palliative care encounter  Brief Summary:- 86 y.o. 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 fib, hypertension, chronic kidney disease stage III, COPD, bladder cancer, CAD, left AKA admitted with fevers and sepsis pathophysiology on 07/01/2019 with concerns about urinary source -Now with concerns about  seizures  A/p 1)Sepsis of unknown source ---on admission pt met sepsis criteria with fevers, tachycardia , tachypnea , Leukocytosis and Hypoxia  -WBC is down to 10.9 from 14.3 however anticipate  further rising WBC with initiation of steroid therapy for COPD -Patient was treated with Rocephin (started 07/01/19) through 07/03/19 --Rocephin was discontinued due to concerns about contribution towards possible seizures -UA suspicious for possible UTI however patient has history of bladder cancer and BPH -Chest x-ray without pneumonia, however patient had wheezing and hypoxia consistent with COPD -blood and urine cultures NGTD  2)HFrEF--- patient with combined diastolic and systolic dysfunction CHF last known EF 35 to 40%, repeat echo pending--- chest x-ray without overt CHF exacerbation, BNP elevated at 1507 (current BNP level which are similar to patient's prior) -Patient with wheezing, shortness of breath and hypoxia--more consistent with COPD exacerbation rather CHF exacerbation -Troponins are elevated peaking at 2252--- trended down, -Patient with history of previous elevated troponins ---Repeat echocardiogram with similar EF to prior -Repeat chest x-ray on 07/03/2019 without acute findings  3)Acute COPD Exacerbation- no definite pneumonia, completed Solu-Medrol - c/n  mucolytics, antibiotics (as above in # 1)  and bronchodilators as ordered, supplemental oxygen as ordered.   4)Possible Seizures Vs Tia-----patient had multiple episodes of "unresponsiveness" concerning for seizures on 07/02/2019 and again on 07/03/2019----caregiver Remo Lipps took several videos while patient was unresponsive ---My serial exam leaves me suspicious for possible seizures ---No jerky or grand mal type activity-   Has intermittent episodes of unresponsiveness followed by severe lethargy-suspect post ictal somnolence -- stroke work-up including MRI brain, CT head and CTA head and neck essentially negative -EEG without  acute findings --Patient received IV Keppra on 07/03/2019 and again on 07/04/2019 -As per neurologist okay to stop Keppra -May use IV lorazepam as needed seizures -Neurology consult from Dr. Merlene Laughter  appreciated --Rocephin was discontinued due to concerns about contribution to was possible seizures --Patient becoming more awake  5)H/o PAFib--- on telemetry episodes of tachyarrhythmia --atrial tachycardia -Continue to keep magnesium and potassium in range -INR is up to 3.8, continue Coumadin anticoagulation -Continue Coreg for rate control at 6.25 mg twice daily --- Hematuria noted  6)CKD stage -  IIIb---- appears at baseline,  creatinine on admission= 2.5 , baseline creatinine =  2.19 (01/08/19)  , creatinine is now=  1.3, renally adjust medications, avoid nephrotoxic agents / dehydration  / hypotension -Renal function improving  7)H/o PAD/CAD--- stable, continue aspirin, continue Coreg, -continue simvastatin, continue isosorbide and hydralazine combo -Continue Coumadin therapy  8)History of DVT/PE -Continue Coumadin therapy  9)Dementia without behavioral disturbances -Continue Aricept and Zoloft,   10)Acute hypoxic respiratory failure secondary to #1 and #3 above--continue supplemental oxygen  Disposition/Need for in-Hospital Stay- patient unable to be discharged at this time due to -----Has intermittent episodes of unresponsiveness and severe lethargy with seizure concerns -Not medically ready for discharge  -We will most likely need SNF rehab   code Status : DNR  Family Communication:   (---- Discussed with caregiver [name] at bedside -Also discussed with patient's son at bedside  Consults  :  na  DVT Prophylaxis  : Coumadin therapy SCDs   Lab Results  Component Value Date   PLT 153 07/05/2019    Inpatient Medications  Scheduled Meds: . aspirin EC  81 mg Oral Daily  . carvedilol  6.25 mg Oral BID WC  . donepezil  10 mg Oral QHS  . febuxostat  40 mg Oral Daily  . fluticasone  1 spray Each Nare QHS  . folic acid  1 mg Oral Daily  . furosemide  40 mg Intravenous BID  . gabapentin  200 mg Oral BID  . guaiFENesin  600 mg Oral BID  . isosorbide dinitrate   20 mg Oral TID   And  . hydrALAZINE  37.5 mg Oral TID  . mouth rinse  15 mL Mouth Rinse BID  . mirabegron ER  25 mg Oral q morning - 10a  . pantoprazole  40 mg Oral Daily  . potassium chloride  10 mEq Oral BID  . sertraline  50 mg Oral q morning - 10a  . simvastatin  10 mg Oral q1800  . sodium chloride flush  3 mL Intravenous Q12H   Continuous Infusions: . sodium chloride    . dextrose 50 mL/hr at 07/05/19 0930   PRN Meds:.sodium chloride, acetaminophen, HYDROcodone-acetaminophen, ipratropium-albuterol, LORazepam, ondansetron (ZOFRAN) IV, sodium chloride flush    Anti-infectives (From admission, onward)   Start     Dose/Rate Route Frequency Ordered Stop   07/02/19 1600  azithromycin (ZITHROMAX) tablet 250 mg     250 mg Oral Daily 07/01/19 1812 07/05/19 1658   07/02/19 1500  cefTRIAXone (ROCEPHIN) 1 g in sodium chloride 0.9 % 100 mL IVPB  Status:  Discontinued     1 g 200 mL/hr over 30 Minutes Intravenous Every 24 hours 07/01/19 1812 07/03/19 1812   07/01/19 1500  cefTRIAXone (ROCEPHIN) 1 g in sodium chloride 0.9 % 100 mL IVPB     1 g 200 mL/hr over 30 Minutes Intravenous  Once 07/01/19 1452 07/01/19 1549   07/01/19 1500  azithromycin (ZITHROMAX) 500 mg in  sodium chloride 0.9 % 250 mL IVPB  Status:  Discontinued     500 mg 250 mL/hr over 60 Minutes Intravenous Every 24 hours 07/01/19 1452 07/01/19 2048       Objective:   Vitals:   07/04/19 2055 07/05/19 0207 07/05/19 1030 07/05/19 1508  BP: 129/66  (!) 147/86 125/70  Pulse: 64  68 66  Resp: 20   14  Temp: 98 F (36.7 C)   98.2 F (36.8 C)  TempSrc:    Oral  SpO2: 94%   100%  Weight:  70.6 kg    Height:        Wt Readings from Last 3 Encounters:  07/05/19 70.6 kg  01/26/19 86.2 kg  12/26/18 86.2 kg     Intake/Output Summary (Last 24 hours) at 07/05/2019 1857 Last data filed at 07/05/2019 1800 Gross per 24 hour  Intake 1779.41 ml  Output 1900 ml  Net -120.59 ml    Physical Exam Gen:- Awake Alert,  In no  apparent distress HEENT:- [place].AT, No sclera icterus Neck-Supple Neck,No JVD,.  Lungs-improving air movement, no wheezing CV- S1, S2 normal, irregularly irregular,  Abd-  +ve B.Sounds, Abd Soft, No tenderness, no CVA tenderness Extremity/Skin:- No  edema, Rt pedal pulses present, Lt AKA Psych-more awake, less lethargic  neuro-generalized weakness no new focal deficits, no tremors   Data Review:   Micro Results Recent Results (from the past 240 hour(s))  Blood Culture (routine x 2)     Status: None (Preliminary result)   Collection Time: 07/01/19  2:52 PM   Specimen: BLOOD RIGHT ARM  Result Value Ref Range Status   Specimen Description BLOOD RIGHT ARM  Final   Special Requests   Final    BOTTLES DRAWN AEROBIC AND ANAEROBIC Blood Culture results may not be optimal due to an inadequate volume of blood received in culture bottles   Culture   Final    NO GROWTH 4 DAYS Performed at [facility], [address], [place], [place] [address]    Report Status PENDING  Incomplete  Blood Culture (routine x 2)     Status: None (Preliminary result)   Collection Time: 07/01/19  3:15 PM   Specimen: BLOOD LEFT ARM  Result Value Ref Range Status   Specimen Description BLOOD LEFT ARM  Final   Special Requests   Final    BOTTLES DRAWN AEROBIC AND ANAEROBIC Blood Culture results may not be optimal due to an inadequate volume of blood received in culture bottles   Culture   Final    NO GROWTH 4 DAYS Performed at [facility], [address], [place], [place] [address]    Report Status PENDING  Incomplete  SARS CORONAVIRUS 2 (TAT 6-24 HRS) Nasopharyngeal Nasopharyngeal Swab     Status: None   Collection Time: 07/01/19  4:21 PM   Specimen: Nasopharyngeal Swab  Result Value Ref Range Status   SARS Coronavirus 2 NEGATIVE NEGATIVE Final    Comment: (NOTE) SARS-CoV-2 target nucleic acids are NOT DETECTED. The SARS-CoV-2 RNA is generally detectable in upper and lower respiratory specimens during the  acute phase of infection. Negative results do not preclude SARS-CoV-2 infection, do not rule out co-infections with other pathogens, and should not be used as the sole basis for treatment or other patient management decisions. Negative results must be combined with clinical observations, patient history, and epidemiological information. The expected result is Negative. Fact Sheet for Patients: SugarRoll.be Fact Sheet for Healthcare Providers: https://www.woods-mathews.com/ This test is not yet  approved or cleared by the [place] and  has been authorized for detection and/or diagnosis of SARS-CoV-2 by FDA under an Emergency Use Authorization (EUA). This EUA will remain  in effect (meaning this test can be used) for the duration of the COVID-19 declaration under Section 56 4(b)(1) of the Act, 21 U.S.C. section 360bbb-3(b)(1), unless the authorization is terminated or revoked sooner. Performed at [facility], [place] [address], [place], [place] [address]   Urine culture     Status: None   Collection Time: 07/01/19 10:15 PM   Specimen: In/Out Cath Urine  Result Value Ref Range Status   Specimen Description   Final    IN/OUT CATH URINE Performed at [facility], [address] [place], [place] [address]    Special Requests   Final    NONE Performed at [facility], [address], [place], [place] [address]    Culture   Final    NO GROWTH Performed at [facility], [place] [address] [place], [place] [address]    Report Status 07/03/2019 FINAL  Final  MRSA PCR Screening     Status: None   Collection Time: 07/02/19  7:51 PM   Specimen: Nasal Mucosa; Nasopharyngeal  Result Value Ref Range Status   MRSA by PCR NEGATIVE NEGATIVE Final    Comment:        The GeneXpert MRSA Assay (FDA approved for NASAL specimens only), is one component of a comprehensive MRSA colonization surveillance program. It is not intended to diagnose  MRSA infection nor to guide or monitor treatment for MRSA infections. Performed at [facility], [address], [place], [place] [address]     Radiology Reports CT ANGIO HEAD W OR WO CONTRAST  Result Date: 07/02/2019 CLINICAL DATA:  Initial evaluation for acute altered mental status. EXAM: CT ANGIOGRAPHY HEAD AND NECK CT PERFUSION BRAIN TECHNIQUE: Multidetector CT imaging of the head and neck was performed using the standard protocol during bolus administration of intravenous contrast. Multiplanar CT image reconstructions and MIPs were obtained to evaluate the vascular anatomy. Carotid stenosis measurements (when applicable) are obtained utilizing NASCET criteria, using the distal internal carotid diameter as the denominator. Multiphase CT imaging of the brain was performed following IV bolus contrast injection. Subsequent parametric perfusion maps were calculated using RAPID software. CONTRAST:  168m OMNIPAQUE IOHEXOL 350 MG/ML SOLN COMPARISON:  Prior head CT from earlier same day. FINDINGS: CTA NECK FINDINGS Aortic arch: Examination technically limited by timing of the contrast bolus, with poor opacification of the arterial system. Visualized aortic arch of normal caliber with normal branch pattern. Moderate atherosclerotic change about the arch and origin of the great vessels without hemodynamically significant stenosis. Visualized subclavian arteries widely patent. Right carotid system: Right CCA tortuous proximally but is widely patent to the bifurcation without flow-limiting stenosis. Extensive bulky calcified plaque about the right bifurcation/proximal right ICA with resultant severe stenosis of at least 75% by NASCET criteria. Right ICA otherwise patent distally to the skull base without stenosis, dissection, or occlusion. Left carotid system: Left CCA mildly tortuous but patent to the bifurcation without flow-limiting stenosis. Extensive bulky plaque about the left bifurcation/proximal left ICA  with resultant severe stenosis of up to left ICA otherwise patent to the skull base without stenosis, dissection or occlusion. Approximately 80% by NASCET criteria. Vertebral arteries: Both vertebral arteries arise from the subclavian arteries. Atheromatous plaque at the origin of the vertebral arteries bilaterally with estimated 50-75% ostial stenosis bilaterally, left greater than right.  Left vertebral artery slightly dominant. Vertebral arteries otherwise patent within the neck without stenosis, dissection, or occlusion. Skeleton: No acute osseous abnormality. No discrete or worrisome osseous lesions. Exaggeration of the normal thoracic kyphosis noted. Degenerative changes noted about the left TMJ. Other neck: No other acute soft tissue abnormality within the neck. No mass lesion or adenopathy. Upper chest: Mild scattered atelectatic changes noted within the visualized lungs. Underlying emphysema. Cardiomegaly with scattered coronary artery calcifications partially visualized. Visualized upper chest demonstrates no other acute finding. Review of the MIP images confirms the above findings CTA HEAD FINDINGS Anterior circulation: Examination of the intracranial circulation technically limited by timing of the contrast bolus. Petrous segments patent bilaterally. Extensive calcified plaque throughout the carotid siphons with associated moderate to severe diffuse narrowing. ICA termini well perfused. A1 segments patent bilaterally. Normal anterior communicating artery. Anterior cerebral arteries patent to their distal aspects without stenosis. No M1 stenosis or occlusion. Normal MCA bifurcations. Distal MCA branches well perfused and symmetric. Posterior circulation: Mild nonstenotic plaque noted within the proximal left V4 segment without significant stenosis. Left vertebral otherwise widely patent to the vertebrobasilar junction. Focal plaque within the proximal right V4 segment with short-segment mild to moderate  stenosis. Left PICA patent. Right PICA not well seen. Basilar patent to its distal aspect without stenosis. Superior cerebral arteries patent bilaterally. Both PCAs primarily supplied via the basilar. PCAs well perfused to their distal aspects without stenosis. Venous sinuses: Not well assessed due to timing of the contrast bolus. Anatomic variants: None significant. Review of the MIP images confirms the above findings CT Brain Perfusion Findings: ASPECTS: 10 CT perfusion portion of this exam failed due to poor timing of the contrast bolus. Study is nondiagnostic for evaluation of acute ischemia or perfusion abnormality. IMPRESSION: CTA HEAD AND NECK IMPRESSION: 1. Technically limited exam due to timing of the contrast bolus. 2. Negative CTA for emergent large vessel occlusion. 3. Heavy atheromatous plaque about the carotid bifurcations with associated stenoses of up to at least 75-80% by NASCET criteria bilaterally, left slightly worse than right. 4. Atheromatous plaque at the origins of both vertebral arteries with estimated moderate to severe 50-75% stenoses bilaterally. Left vertebral artery dominant. 5. Extensive calcified plaque throughout the carotid siphons with associated moderate to severe diffuse narrowing. 6. Aortic Atherosclerosis (ICD10-I70.0) and Emphysema (ICD10-J43.9). CT PERFUSION IMPRESSION: CT perfusion portion of this exam failed due to poor timing of the contrast bolus. Examination is nondiagnostic for evaluation of possible ischemia or other perfusion abnormality. Electronically Signed   By: Jeannine Boga M.D.   On: 07/02/2019 20:27   DG Chest 2 View  Result Date: 07/03/2019 CLINICAL DATA:  Dyspnea EXAM: CHEST - 2 VIEW COMPARISON:  07/01/2019 FINDINGS: Frontal and lateral views of the chest demonstrate a stable cardiac silhouette. There is persistent central vascular congestion. Chronic background interstitial scarring. No airspace disease, effusion, or pneumothorax. No acute bony  abnormalities. IMPRESSION: 1. Chronic central vascular congestion. 2. Chronic interstitial lung disease. 3. No acute airspace disease. Electronically Signed   By: Randa Ngo M.D.   On: 07/03/2019 12:20   CT ANGIO NECK W OR WO CONTRAST  Result Date: 07/02/2019 CLINICAL DATA:  Initial evaluation for acute altered mental status. EXAM: CT ANGIOGRAPHY HEAD AND NECK CT PERFUSION BRAIN TECHNIQUE: Multidetector CT imaging of the head and neck was performed using the standard protocol during bolus administration of intravenous contrast. Multiplanar CT image reconstructions and MIPs were obtained to evaluate the vascular anatomy. Carotid stenosis measurements (when applicable) are obtained  utilizing NASCET criteria, using the distal internal carotid diameter as the denominator. Multiphase CT imaging of the brain was performed following IV bolus contrast injection. Subsequent parametric perfusion maps were calculated using RAPID software. CONTRAST:  138m OMNIPAQUE IOHEXOL 350 MG/ML SOLN COMPARISON:  Prior head CT from earlier same day. FINDINGS: CTA NECK FINDINGS Aortic arch: Examination technically limited by timing of the contrast bolus, with poor opacification of the arterial system. Visualized aortic arch of normal caliber with normal branch pattern. Moderate atherosclerotic change about the arch and origin of the great vessels without hemodynamically significant stenosis. Visualized subclavian arteries widely patent. Right carotid system: Right CCA tortuous proximally but is widely patent to the bifurcation without flow-limiting stenosis. Extensive bulky calcified plaque about the right bifurcation/proximal right ICA with resultant severe stenosis of at least 75% by NASCET criteria. Right ICA otherwise patent distally to the skull base without stenosis, dissection, or occlusion. Left carotid system: Left CCA mildly tortuous but patent to the bifurcation without flow-limiting stenosis. Extensive bulky plaque about  the left bifurcation/proximal left ICA with resultant severe stenosis of up to left ICA otherwise patent to the skull base without stenosis, dissection or occlusion. Approximately 80% by NASCET criteria. Vertebral arteries: Both vertebral arteries arise from the subclavian arteries. Atheromatous plaque at the origin of the vertebral arteries bilaterally with estimated 50-75% ostial stenosis bilaterally, left greater than right. Left vertebral artery slightly dominant. Vertebral arteries otherwise patent within the neck without stenosis, dissection, or occlusion. Skeleton: No acute osseous abnormality. No discrete or worrisome osseous lesions. Exaggeration of the normal thoracic kyphosis noted. Degenerative changes noted about the left TMJ. Other neck: No other acute soft tissue abnormality within the neck. No mass lesion or adenopathy. Upper chest: Mild scattered atelectatic changes noted within the visualized lungs. Underlying emphysema. Cardiomegaly with scattered coronary artery calcifications partially visualized. Visualized upper chest demonstrates no other acute finding. Review of the MIP images confirms the above findings CTA HEAD FINDINGS Anterior circulation: Examination of the intracranial circulation technically limited by timing of the contrast bolus. Petrous segments patent bilaterally. Extensive calcified plaque throughout the carotid siphons with associated moderate to severe diffuse narrowing. ICA termini well perfused. A1 segments patent bilaterally. Normal anterior communicating artery. Anterior cerebral arteries patent to their distal aspects without stenosis. No M1 stenosis or occlusion. Normal MCA bifurcations. Distal MCA branches well perfused and symmetric. Posterior circulation: Mild nonstenotic plaque noted within the proximal left V4 segment without significant stenosis. Left vertebral otherwise widely patent to the vertebrobasilar junction. Focal plaque within the proximal right V4 segment  with short-segment mild to moderate stenosis. Left PICA patent. Right PICA not well seen. Basilar patent to its distal aspect without stenosis. Superior cerebral arteries patent bilaterally. Both PCAs primarily supplied via the basilar. PCAs well perfused to their distal aspects without stenosis. Venous sinuses: Not well assessed due to timing of the contrast bolus. Anatomic variants: None significant. Review of the MIP images confirms the above findings CT Brain Perfusion Findings: ASPECTS: 10 CT perfusion portion of this exam failed due to poor timing of the contrast bolus. Study is nondiagnostic for evaluation of acute ischemia or perfusion abnormality. IMPRESSION: CTA HEAD AND NECK IMPRESSION: 1. Technically limited exam due to timing of the contrast bolus. 2. Negative CTA for emergent large vessel occlusion. 3. Heavy atheromatous plaque about the carotid bifurcations with associated stenoses of up to at least 75-80% by NASCET criteria bilaterally, left slightly worse than right. 4. Atheromatous plaque at the origins of both vertebral arteries with  estimated moderate to severe 50-75% stenoses bilaterally. Left vertebral artery dominant. 5. Extensive calcified plaque throughout the carotid siphons with associated moderate to severe diffuse narrowing. 6. Aortic Atherosclerosis (ICD10-I70.0) and Emphysema (ICD10-J43.9). CT PERFUSION IMPRESSION: CT perfusion portion of this exam failed due to poor timing of the contrast bolus. Examination is nondiagnostic for evaluation of possible ischemia or other perfusion abnormality. Electronically Signed   By: Jeannine Boga M.D.   On: 07/02/2019 20:27   MR BRAIN WO CONTRAST  Result Date: 07/03/2019 CLINICAL DATA:  86 year old male code stroke presentation yesterday. Negative large vessel occlusion on CTA. EXAM: MRI HEAD WITHOUT CONTRAST TECHNIQUE: Multiplanar, multiecho pulse sequences of the brain and surrounding structures were obtained without intravenous  contrast. COMPARISON:  CT head and CTA head and neck yesterday. FINDINGS: Brain: No restricted diffusion or evidence of acute infarction. Chronic lacunar type infarct of the left corona radiata and extending to the left deep gray nuclei. Occasional superimposed small areas of chronic cortical encephalomalacia such as the right superior frontal gyrus on series 8, image 42, pre motor area. Scattered other white matter T2 and FLAIR hyperintensity, although generally mild for age. There are occasional chronic micro hemorrhages suspected (left periatrial white matter series 9, image 12). Brainstem and cerebellum appear largely negative. Cavum septum pellucidum, normal variant. No midline shift, mass effect, evidence of mass lesion, ventriculomegaly, extra-axial collection or acute intracranial hemorrhage. Cervicomedullary junction and pituitary are within normal limits. Vascular: Major intracranial vascular flow voids is are preserved. Skull and upper cervical spine: Degenerative ligamentous hypertrophy about the odontoid. Visualized bone marrow signal is within normal limits. Sinuses/Orbits: Postoperative changes to both globes, otherwise negative orbits. Paranasal sinus mucosal thickening is stable and most pronounced in the left maxillary sinus. Other: Mastoid air cells are well pneumatized. Limited detail of the internal auditory structures. Scalp and face soft tissues appear grossly negative. IMPRESSION: 1. No acute intracranial abnormality. 2. Chronic small vessel ischemia, most pronounced in the left corona radiata. Electronically Signed   By: Genevie Ann M.D.   On: 07/03/2019 18:17   CT CEREBRAL PERFUSION W CONTRAST  Result Date: 07/02/2019 CLINICAL DATA:  Initial evaluation for acute altered mental status. EXAM: CT ANGIOGRAPHY HEAD AND NECK CT PERFUSION BRAIN TECHNIQUE: Multidetector CT imaging of the head and neck was performed using the standard protocol during bolus administration of intravenous contrast.  Multiplanar CT image reconstructions and MIPs were obtained to evaluate the vascular anatomy. Carotid stenosis measurements (when applicable) are obtained utilizing NASCET criteria, using the distal internal carotid diameter as the denominator. Multiphase CT imaging of the brain was performed following IV bolus contrast injection. Subsequent parametric perfusion maps were calculated using RAPID software. CONTRAST:  143m OMNIPAQUE IOHEXOL 350 MG/ML SOLN COMPARISON:  Prior head CT from earlier same day. FINDINGS: CTA NECK FINDINGS Aortic arch: Examination technically limited by timing of the contrast bolus, with poor opacification of the arterial system. Visualized aortic arch of normal caliber with normal branch pattern. Moderate atherosclerotic change about the arch and origin of the great vessels without hemodynamically significant stenosis. Visualized subclavian arteries widely patent. Right carotid system: Right CCA tortuous proximally but is widely patent to the bifurcation without flow-limiting stenosis. Extensive bulky calcified plaque about the right bifurcation/proximal right ICA with resultant severe stenosis of at least 75% by NASCET criteria. Right ICA otherwise patent distally to the skull base without stenosis, dissection, or occlusion. Left carotid system: Left CCA mildly tortuous but patent to the bifurcation without flow-limiting stenosis. Extensive bulky plaque about the  left bifurcation/proximal left ICA with resultant severe stenosis of up to left ICA otherwise patent to the skull base without stenosis, dissection or occlusion. Approximately 80% by NASCET criteria. Vertebral arteries: Both vertebral arteries arise from the subclavian arteries. Atheromatous plaque at the origin of the vertebral arteries bilaterally with estimated 50-75% ostial stenosis bilaterally, left greater than right. Left vertebral artery slightly dominant. Vertebral arteries otherwise patent within the neck without  stenosis, dissection, or occlusion. Skeleton: No acute osseous abnormality. No discrete or worrisome osseous lesions. Exaggeration of the normal thoracic kyphosis noted. Degenerative changes noted about the left TMJ. Other neck: No other acute soft tissue abnormality within the neck. No mass lesion or adenopathy. Upper chest: Mild scattered atelectatic changes noted within the visualized lungs. Underlying emphysema. Cardiomegaly with scattered coronary artery calcifications partially visualized. Visualized upper chest demonstrates no other acute finding. Review of the MIP images confirms the above findings CTA HEAD FINDINGS Anterior circulation: Examination of the intracranial circulation technically limited by timing of the contrast bolus. Petrous segments patent bilaterally. Extensive calcified plaque throughout the carotid siphons with associated moderate to severe diffuse narrowing. ICA termini well perfused. A1 segments patent bilaterally. Normal anterior communicating artery. Anterior cerebral arteries patent to their distal aspects without stenosis. No M1 stenosis or occlusion. Normal MCA bifurcations. Distal MCA branches well perfused and symmetric. Posterior circulation: Mild nonstenotic plaque noted within the proximal left V4 segment without significant stenosis. Left vertebral otherwise widely patent to the vertebrobasilar junction. Focal plaque within the proximal right V4 segment with short-segment mild to moderate stenosis. Left PICA patent. Right PICA not well seen. Basilar patent to its distal aspect without stenosis. Superior cerebral arteries patent bilaterally. Both PCAs primarily supplied via the basilar. PCAs well perfused to their distal aspects without stenosis. Venous sinuses: Not well assessed due to timing of the contrast bolus. Anatomic variants: None significant. Review of the MIP images confirms the above findings CT Brain Perfusion Findings: ASPECTS: 10 CT perfusion portion of this  exam failed due to poor timing of the contrast bolus. Study is nondiagnostic for evaluation of acute ischemia or perfusion abnormality. IMPRESSION: CTA HEAD AND NECK IMPRESSION: 1. Technically limited exam due to timing of the contrast bolus. 2. Negative CTA for emergent large vessel occlusion. 3. Heavy atheromatous plaque about the carotid bifurcations with associated stenoses of up to at least 75-80% by NASCET criteria bilaterally, left slightly worse than right. 4. Atheromatous plaque at the origins of both vertebral arteries with estimated moderate to severe 50-75% stenoses bilaterally. Left vertebral artery dominant. 5. Extensive calcified plaque throughout the carotid siphons with associated moderate to severe diffuse narrowing. 6. Aortic Atherosclerosis (ICD10-I70.0) and Emphysema (ICD10-J43.9). CT PERFUSION IMPRESSION: CT perfusion portion of this exam failed due to poor timing of the contrast bolus. Examination is nondiagnostic for evaluation of possible ischemia or other perfusion abnormality. Electronically Signed   By: Jeannine Boga M.D.   On: 07/02/2019 20:27   DG Chest Port 1 View  Result Date: 07/01/2019 CLINICAL DATA:  Shortness of breath. EXAM: PORTABLE CHEST 1 VIEW COMPARISON:  December 26, 2018. FINDINGS: Stable cardiomediastinal silhouette. No pneumothorax or pleural effusion is noted. Both lungs are clear. The visualized skeletal structures are unremarkable. IMPRESSION: No active disease. Electronically Signed   By: Marijo Conception M.D.   On: 07/01/2019 15:15   EEG adult  Result Date: 07/03/2019 Lora Havens, MD     07/03/2019  6:59 PM Patient Name: ROMULO OKRAY MRN: [id number] Epilepsy Attending: Lora Havens  Referring Physician/Provider: Dr Roxan Hockey Date: 07/03/2019 Duration: 24.14 mins Patient history: 86 year old male with transient alteration of awareness.  EEG evaluate for seizures. Level of alertness: Awake, asleep AEDs during EEG study: Gabapentin Technical  aspects: This EEG study was done with scalp electrodes positioned according to the 10-20 International system of electrode placement. Electrical activity was acquired at a sampling rate of 500Hz  and reviewed with a high frequency filter of 70Hz  and a low frequency filter of 1Hz . EEG data were recorded continuously and digitally stored. Description: No clear posterior dominant rhythm was seen. Sleep was characterized by vertex waves, maximal frontocentral region.  EEG showed continuous generalized 6-8 Hz theta-alpha activity.     Hyperventilation and photic stimulation were not performed.  Abnormality - Continuous slow, generalized IMPRESSION: This study is suggestive of mild to moderate diffuse encephalopathy. No seizures or epileptiform discharges were seen throughout the recording. Lora Havens   ECHOCARDIOGRAM COMPLETE  Result Date: 07/02/2019    ECHOCARDIOGRAM REPORT   Patient Name:   PRABHAV FAULKENBERRY [place] Date of Exam: 07/02/2019 Medical Rec #:  [id number]     Height:       65.0 in Accession #:    [id number]    Weight:       147.9 lb Date of Birth:  December 21, 1921      BSA:          1.740 m Patient Age:    89 years      BP:           130/56 mmHg Patient Gender: M             HR:           40 bpm. Exam Location:  [facility] Procedure: 2D Echo Indications:    CHF-Acute Systolic 710.62 / I94.85  History:        Patient has prior history of Echocardiogram examinations, most                 recent 12/11/2018. CAD and Previous Myocardial Infarction,                 Arrythmias:Atrial Fibrillation, Signs/Symptoms:Dyspnea; Risk                 Factors:Dyslipidemia, Hypertension and Former Smoker. NICM ,                 Bradycardia.  Sonographer:    Leavy Cella RDCS (AE) Referring Phys: [place]  1. Left ventricular ejection fraction, by estimation, is 35 to 40%. The left ventricle has moderately decreased function. The left ventricle demonstrates regional wall motion abnormalities (see scoring  diagram/findings for description). Left ventricular  diastolic parameters are indeterminate.  2. Right ventricular systolic function is moderately reduced. The right ventricular size is normal. There is moderately elevated pulmonary artery systolic pressure. The estimated right ventricular systolic pressure is 46.2 mmHg.  3. Left atrial size was mildly dilated.  4. The mitral valve is degenerative. Mild mitral valve regurgitation.  5. The aortic valve is tricuspid, moderately calcified with restricted motion of the left coronary cusp. Aortic valve regurgitation is not visualized.  6. The inferior vena cava is normal in size with greater than 50% respiratory variability, suggesting right atrial pressure of 3 mmHg. FINDINGS  Left Ventricle: Left ventricular ejection fraction, by estimation, is 35 to 40%. The left ventricle has moderately decreased function. The left ventricle demonstrates regional wall motion abnormalities. The left ventricular internal cavity size was normal  in size. There is borderline left ventricular hypertrophy. Left ventricular diastolic parameters are indeterminate.  LV Wall Scoring: The basal inferior segment is akinetic. The mid inferior segment is hypokinetic. Right Ventricle: The right ventricular size is normal. No increase in right ventricular wall thickness. Right ventricular systolic function is moderately reduced. There is moderately elevated pulmonary artery systolic pressure. The tricuspid regurgitant velocity is 3.66 m/s, and with an assumed right atrial pressure of 3 mmHg, the estimated right ventricular systolic pressure is 28.7 mmHg. Left Atrium: Left atrial size was mildly dilated. Right Atrium: Right atrial size was normal in size. Pericardium: There is no evidence of pericardial effusion. Mitral Valve: The mitral valve is degenerative in appearance. Moderate mitral annular calcification. Mild mitral valve regurgitation. Tricuspid Valve: The tricuspid valve is grossly normal.  Tricuspid valve regurgitation is mild. Aortic Valve: The aortic valve is tricuspid. Aortic valve regurgitation is not visualized. Moderate aortic valve annular calcification. There is moderate calcification of the aortic valve. Pulmonic Valve: The pulmonic valve was not well visualized. Pulmonic valve regurgitation is not visualized. Aorta: The aortic root is normal in size and structure. Venous: The inferior vena cava is normal in size with greater than 50% respiratory variability, suggesting right atrial pressure of 3 mmHg. IAS/Shunts: No atrial level shunt detected by color flow Doppler.  LEFT VENTRICLE PLAX 2D LVIDd:         5.14 cm  Diastology LVIDs:         3.31 cm  LV e' lateral:   5.11 cm/s LV PW:         1.08 cm  LV E/e' lateral: 18.5 LV IVS:        1.06 cm  LV e' medial:    3.15 cm/s LVOT diam:     2.20 cm  LV E/e' medial:  29.9 LVOT Area:     3.80 cm  RIGHT VENTRICLE RV S prime:     8.05 cm/s TAPSE (M-mode): 1.2 cm LEFT ATRIUM             Index LA diam:        3.60 cm 2.07 cm/m LA Vol (A2C):   61.0 ml 35.05 ml/m LA Vol (A4C):   56.7 ml 32.58 ml/m LA Biplane Vol: 60.6 ml 34.82 ml/m   AORTA Ao Root diam: 3.30 cm MITRAL VALVE                TRICUSPID VALVE MV Area (PHT): 2.55 cm     TR Peak grad:   53.6 mmHg MV Decel Time: 297 msec     TR Vmax:        366.00 cm/s MV E velocity: 94.30 cm/s MV A velocity: 123.00 cm/s  SHUNTS MV E/A ratio:  0.77         Systemic Diam: 2.20 cm Rozann Lesches MD Electronically signed by Rozann Lesches MD Signature Date/Time: 07/02/2019/2:59:42 PM    Final    CT HEAD CODE STROKE WO CONTRAST`  Addendum Date: 07/02/2019   ADDENDUM REPORT: 07/02/2019 18:33 ADDENDUM: Study discussed by telephone with Dr. Roxan Hockey on 07/02/2019 at [place] hours. Electronically Signed   By: Genevie Ann M.D.   On: 07/02/2019 18:33   Result Date: 07/02/2019 CLINICAL DATA:  Code stroke. 86 year old male with sudden change in behavior, now mostly nonresponsive. EXAM: CT HEAD WITHOUT CONTRAST  TECHNIQUE: Contiguous axial images were obtained from the base of the skull through the vertex without intravenous contrast. COMPARISON:  Head CT 12/21/2018. FINDINGS: Brain: Stable cerebral volume.  No ventriculomegaly. Cavum septum pellucidum, normal variant. 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 Stable gray-white matter differentiation throughout the brain. No acute intracranial hemorrhage identified. No midline shift, mass effect, or evidence of intracranial mass lesion. No cortically based acute infarct identified. Vascular: Extensive Calcified atherosclerosis at the skull base.6 No suspicious intracranial vascular hyperdensity. [id number] Skull: No acute osseous abnormality identified. Sinuses/Orbits: Stable paranasal sinuses since last year. Mastoids remain clear. Other: No acute orbit or scalp soft tissue finding. ASPECTS [facility] Stroke Program Early CT Score) Total score (0-10 with 10 being normal): 10 IMPRESSION: 1. Stable non contrast CT appearance of the brain since 2020. No acute cortically based infarct or acute intracranial hemorrhage identified. ASPECTS 10. 2. Chronic small vessel ischemia in the left corona radiata and basal ganglia. Electronically Signed: By: Genevie Ann M.D. On: 07/02/2019 18:27     CBC Recent Labs  Lab 07/01/19 1452 07/02/19 0443 07/02/19 1247 07/03/19 0523 07/05/19 0610  WBC 14.3* 12.5* 11.3* 10.9* 9.3  HGB 13.7 13.0 12.3* 11.7* 12.1*  HCT 43.5 41.7 39.5 37.9* 40.2  PLT 158 131* 120* 119* 153  MCV 87.7 89.5 88.8 89.2 90.1  MCH 27.6 27.9 27.6 27.5 27.1  MCHC 31.5 31.2 31.1 30.9 30.1  RDW 15.0 15.5 15.3 15.7* 15.4  LYMPHSABS 1.1  --  0.8  --   --   MONOABS 0.8  --  0.8  --   --   EOSABS 0.0  --  0.0  --   --   BASOSABS 0.0  --  0.0  --   --     Chemistries  Recent Labs  Lab 07/01/19 1452 07/02/19 0443 07/02/19 1247 07/03/19 0523 07/05/19 0610  NA 141 147* 144 143 143  K 3.9 4.1 4.4 4.3 4.6  CL 103 108 106 108 108  CO2 27 27  29 26 25   GLUCOSE 101* 103* 117* 142* 138*  BUN 55* 52* 54* 55* 56*  CREATININE 2.50* 2.22* 2.26* 1.94* 1.37*  CALCIUM 9.5 9.5 9.1 8.8* 9.2  MG  --  2.4  --   --   --   AST 24  --   --   --   --   ALT 16  --   --   --   --   ALKPHOS 157*  --   --   --   --   BILITOT 0.9  --   --   --   --   ------------------------------------------------------------------------------------------------------------------ No results for input(s): CHOL, HDL, LDLCALC, TRIG, CHOLHDL, LDLDIRECT in the last 72 hours.  Lab Results  Component Value Date   HGBA1C 5.8 (H) 11/28/2015   ------------------------------------------------------------------------------------------------------------------ No results for input(s): TSH, T4TOTAL, T3FREE, THYROIDAB in the last 72 hours.  Invalid input(s): FREET3 ------------------------------------------------------------------------------------------------------------------ No results for input(s): VITAMINB12, FOLATE, FERRITIN, TIBC, IRON, RETICCTPCT in the last 72 hours.  Coagulation profile Recent Labs  Lab 07/01/19 1452 07/02/19 0443 07/03/19 0523 07/04/19 0526 07/05/19 0610  INR 2.0* 2.1* 2.9* 3.9* 3.8*    No results for input(s): DDIMER in the last 72 hours.  Cardiac Enzymes No results for input(s): CKMB, TROPONINI, MYOGLOBIN in the last 168 hours.  Invalid input(s): CK ------------------------------------------------------------------------------------------------------------------    Component Value Date/Time   BNP 1,193.0 (H) 07/02/2019 1247    Roxan Hockey M.D on 07/05/2019 at 6:57 PM  Go to [email or web address] - for contact info  [facility] - Office  [phone]

## 2019-07-05 NOTE — Progress Notes (Signed)
Physical Therapy Treatment Patient Details Name: Scott [name] MRN: [id number] DOB: Nov 21, 1921 Today's Date: 07/05/2019    History of Present Illness Scott [name] is a 86 y.o. male with medical history significant of DNR/DNI, hx of a fib on coumadin, CAD s/p MI 2019, left BKA, diabetes, presented to the hospital with fever, lethargy, and shortness of breath.  His caretaker at the bedside reports that for the past 3 days or so the patient has been complaining about shortness of breath.  They have noted that his home O2 sat has been dropping as low as 72% on room air.  Normally the patient is 93 to 94% on room air.  They feel like he was improving with albuterol treatments at home, but today he maintained sats in the 80s.  Patient never complained about chest pain.  They noted that he might be febrile today.  Patient himself appears extremely tired on exam and cannot provide me any other history.    PT Comments    Patient presents slightly more alert and able to answer most questions and follow instructions requiring repeated verbal/tactile cueing.  Patient tolerated sitting up at bedside for approximately 20 minutes while completing BLE ROM/strengthening exercises with active assistance, required Max assist to scoot forward/laterraly at bedside and unable to stand due to weakness.  Patient maintained sitting balance throughout treatment and required Max assist to reposition when put back to bed.  Patient will benefit from continued physical therapy in hospital and recommended venue below to increase strength, balance, endurance for safe ADLs and gait.    Follow Up Recommendations  SNF;Supervision for mobility/OOB;Supervision/Assistance - 24 hour     Equipment Recommendations  None recommended by PT    Recommendations for Other Services       Precautions / Restrictions Precautions Precautions: Fall Restrictions Weight Bearing Restrictions: No    Mobility  Bed Mobility Overal bed  mobility: Needs Assistance Bed Mobility: Supine to Sit;Sit to Supine     Supine to sit: Max assist Sit to supine: Max assist   General bed mobility comments: slow labored movement  Transfers                    Ambulation/Gait                 Stairs             Wheelchair Mobility    Modified Rankin (Stroke Patients Only)       Balance Overall balance assessment: Needs assistance Sitting-balance support: Feet supported;No upper extremity supported Sitting balance-Leahy Scale: Fair Sitting balance - Comments: seated at EOB                                    Cognition Arousal/Alertness: Lethargic Behavior During Therapy: Flat affect Overall Cognitive Status: Impaired/Different from baseline Area of Impairment: Attention;Following commands                       Following Commands: Follows one step commands with increased time;Follows multi-step commands inconsistently       General Comments: Slightly more alert, able to keep eyes open for a few minutes when prompted, but eyes closed most of time, able to respond appropriately to most questions      Exercises General Exercises - Lower Extremity Ankle Circles/Pumps: Seated;AROM;Right;10 reps Long Arc Quad: Seated;AAROM;Strengthening;Right;10 reps Hip Flexion/Marching: Seated;AAROM;Strengthening;Right;10 reps  General Comments        Pertinent Vitals/Pain Pain Assessment: No/denies pain    Home Living                      Prior Function            PT Goals (current goals can now be found in the care plan section) Acute Rehab PT Goals Patient Stated Goal: return home PT Goal Formulation: With patient Time For Goal Achievement: 07/18/19 Potential to Achieve Goals: Good Progress towards PT goals: Progressing toward goals    Frequency    Min 3X/week      PT Plan Current plan remains appropriate    Co-evaluation              AM-PAC  PT "6 Clicks" Mobility   Outcome Measure  Help needed turning from your back to your side while in a flat bed without using bedrails?: A Lot Help needed moving from lying on your back to sitting on the side of a flat bed without using bedrails?: A Lot Help needed moving to and from a bed to a chair (including a wheelchair)?: Total Help needed standing up from a chair using your arms (e.g., wheelchair or bedside chair)?: Total Help needed to walk in hospital room?: Total Help needed climbing 3-5 steps with a railing? : Total 6 Click Score: 8    End of Session   Activity Tolerance: Patient limited by lethargy;Patient limited by fatigue Patient left: in bed;with call bell/phone within reach;with family/visitor present Nurse Communication: Mobility status PT Visit Diagnosis: Unsteadiness on feet (R26.81);Muscle weakness (generalized) (M62.81);History of falling (Z91.81);Difficulty in walking, not elsewhere classified (R26.2)     Time: [id number] PT Time Calculation (min) (ACUTE ONLY): 27 min  Charges:  $Therapeutic Exercise: 8-22 mins $Therapeutic Activity: 8-22 mins                     11:01 AM, 07/05/19 Lonell Grandchild, MPT Physical Therapist with [facility] [phone] office [phone] mobile phone

## 2019-07-05 NOTE — Progress Notes (Signed)
Noted that urine was dark pink with blood clots.  Notified MD via text page.  Will continue to monitor.

## 2019-07-05 NOTE — Significant Event (Signed)
Patient having hematuria with blood clots.  On Coumadin.  INR is 3.9.  Coumadin has been discontinued.

## 2019-07-05 NOTE — Progress Notes (Signed)
Notified that patient had a 10 beat run of V-Tach.  Patient asymptomatic and vitals stable.  On-call MD notified via text page.  Will continue to monitor.

## 2019-07-05 NOTE — NC FL2 (Signed)
[place] LEVEL OF CARE SCREENING TOOL     IDENTIFICATION  Patient Name: Scott [name] Birthdate: 17-Sep-1921 Sex: male Admission Date (Current Location): 07/01/2019  [facility] and [place] Number:  [employer] and Address:  [facility] [address] [place]      Provider Number: [id number]  Attending Physician Name and Address:  Roxan Hockey, MD  Relative Name and Phone Number:  Nikolaos, Defau[place] [phone]    Current Level of Care: Hospital Recommended Level of Care: [place] Prior Approval Number: [id number]  Date Approved/Denied: 03/10/11 PASRR Number:    Discharge Plan: SNF    Current Diagnoses: Patient Active Problem List   Diagnosis Date Noted  . Goals of care, counseling/discussion   . Palliative care encounter   . UTI (urinary tract infection) 07/02/2019  . CAP (community acquired pneumonia) 07/01/2019  . Acute combined systolic (congestive) and diastolic (congestive) heart failure ([place]) 07/01/2019  . Foot pain 12/21/2018  . Cellulitis 12/21/2018  . CKD (chronic kidney disease) stage 3, GFR 30-59 ml/min 12/11/2018  . Dyspnea 12/10/2018  . Alzheimer disease ([place]) 09/07/2018  . SIRS (systemic inflammatory response syndrome) ([place]) 06/22/2018  . Paroxysmal atrial fibrillation ([place]) 01/18/2017  . PVC (pulmonary venous congestion) 01/18/2017  . Thoracic aortic atherosclerosis ([place]) 11/26/2016  . Thrombocytopenia ([place]) 08/04/2016  . Chronic kidney disease (CKD) stage G3a/A1, moderately decreased glomerular filtration rate (GFR) between 45-59 mL/min/1.73 square meter and albuminuria creatinine ratio less than 30 mg/g (HCC) 08/04/2016  . Essential hypertension 11/29/2015  . Bradycardia 11/28/2015  . Malignant neoplasm of lateral wall of urinary bladder ([place]) 07/10/2015  . Status post above-knee amputation of left lower extremity ([place]) 10/18/2014  . Tachyarrhythmia 10/05/2014  . Aspiration  pneumonia ([place]) 10/05/2014  . Sepsis due to other etiology ([place]) 10/03/2014  . Atherosclerotic PVD with ulceration ([place]) 10/03/2014  . Atrial fibrillation with RVR ([place]) 10/03/2014  . Hyperkalemia 10/03/2014  . PAD (peripheral artery disease) ([place]) 09/24/2014  . NSTEMI (non-ST elevated myocardial infarction) ([place]) 10/09/2013  . High risk medication use 05/17/2013  . BPH (benign prostatic hyperplasia) 05/17/2013  . Vitamin D deficiency 05/17/2013  . History of pulmonary embolism (2014) 04/17/2013  . NSVT (nonsustained ventricular tachycardia)- not an ICD candidate 04/17/2013  . NICM (nonischemic cardiomyopathy)- EF 20-25% by Echo 04/12/13 04/17/2013  . Chronic combined systolic and diastolic heart failure ([place]) 03/15/2013  . DVT of lower extremity, bilateral ([place]) 03/15/2013  . Constipation 11/02/2012  . Syncope 06/21/2012  . Bladder cancer ([place]) 03/15/2012  . Benign localized hyperplasia of prostate with urinary obstruction 03/15/2012  . Hyperlipidemia   . PVCs (premature ventricular contractions)   . CAD- non obstructive disease by cath 3/14     Orientation RESPIRATION BLADDER Height & Weight     (POSTICTAL CURRENTLY, USUALLY ALERT AND ORIENTED X4)  Normal(SEE DC SUMMARY) Continent Weight: 70.6 kg Height:  5\' 5"  (165.1 cm)  BEHAVIORAL SYMPTOMS/MOOD NEUROLOGICAL BOWEL NUTRITION STATUS      Continent Diet(SEE DC SUMMARY)  AMBULATORY STATUS COMMUNICATION OF NEEDS Skin   Extensive Assist Verbally Normal                       Personal Care Assistance Level of Assistance  Bathing, Dressing, Feeding Bathing Assistance: Maximum assistance Feeding assistance: Limited assistance Dressing Assistance: Maximum assistance     Functional Limitations Info  Sight, Hearing, Speech Sight Info: Adequate Hearing Info: Adequate Speech Info: Adequate    SPECIAL CARE FACTORS FREQUENCY  PT (By licensed PT)     PT Frequency: 5X/WEEK              Contractures Contractures Info:  Not present    Additional Factors Info  Code Status, Allergies Code Status Info: DNR Allergies Info: LIPITOR           Current Medications (07/05/2019):  This is the current hospital active medication list Current Facility-Administered Medications  Medication Dose Route Frequency Provider Last Rate Last Admin  . 0.9 %  sodium chloride infusion  250 mL Intravenous PRN Norins, Heinz Knuckles, MD      . acetaminophen (TYLENOL) tablet 650 mg  650 mg Oral Q6H PRN Neena Rhymes, MD   650 mg at 07/02/19 1330  . aspirin EC tablet 81 mg  81 mg Oral Daily Neena Rhymes, MD   81 mg at 07/04/19 0859  . azithromycin (ZITHROMAX) tablet 250 mg  250 mg Oral Daily Norins, Heinz Knuckles, MD   250 mg at 07/04/19 1603  . carvedilol (COREG) tablet 6.25 mg  6.25 mg Oral BID WC Norins, Heinz Knuckles, MD   6.25 mg at 07/04/19 0900  . Chlorhexidine Gluconate Cloth 2 % PADS 6 each  6 each Topical Daily Roxan Hockey, MD   6 each at 07/03/19 [phone]  . dextrose 5 % solution   Intravenous Continuous Emokpae, Courage, MD 75 mL/hr at 07/05/19 0300 Rate Verify at 07/05/19 0300  . donepezil (ARICEPT) tablet 10 mg  10 mg Oral QHS Norins, Heinz Knuckles, MD   10 mg at 07/04/19 2048  . febuxostat (ULORIC) tablet 40 mg  40 mg Oral Daily Norins, Heinz Knuckles, MD   40 mg at 07/04/19 0858  . fluticasone (FLONASE) 50 MCG/ACT nasal spray 1 spray  1 spray Each Nare QHS Norins, Heinz Knuckles, MD   1 spray at 07/03/19 2252  . folic acid (FOLVITE) tablet 1 mg  1 mg Oral Daily Norins, Heinz Knuckles, MD   1 mg at 07/04/19 0901  . furosemide (LASIX) injection 40 mg  40 mg Intravenous BID Roxan Hockey, MD   40 mg at 07/04/19 1824  . gabapentin (NEURONTIN) capsule 200 mg  200 mg Oral BID Neena Rhymes, MD   200 mg at 07/04/19 2046  . guaiFENesin (MUCINEX) 12 hr tablet 600 mg  600 mg Oral BID Denton Brick, Courage, MD   600 mg at 07/04/19 2048  . isosorbide dinitrate (ISORDIL) tablet 20 mg  20 mg Oral TID Neena Rhymes, MD   20 mg at 07/04/19 2046    And  . hydrALAZINE (APRESOLINE) tablet 37.5 mg  37.5 mg Oral TID Neena Rhymes, MD   37.5 mg at 07/04/19 2047  . HYDROcodone-acetaminophen (NORCO/VICODIN) 5-325 MG per tablet 1 tablet  1 tablet Oral BID PRN Norins, Heinz Knuckles, MD      . ipratropium-albuterol (DUONEB) 0.5-2.5 (3) MG/3ML nebulizer solution 3 mL  3 mL Nebulization Q4H PRN Norins, Heinz Knuckles, MD   3 mL at 07/03/19 0630  . LORazepam (ATIVAN) injection 0.5 mg  0.5 mg Intravenous Q6H PRN Denton Brick, Courage, MD   0.5 mg at 07/03/19 2302  . MEDLINE mouth rinse  15 mL Mouth Rinse BID Johnson, Clanford L, MD   15 mL at 07/03/19 2300  . mirabegron ER (MYRBETRIQ) tablet 25 mg  25 mg Oral q morning - 10a Norins, Heinz Knuckles, MD   25 mg at 07/04/19 0858  . ondansetron (ZOFRAN) injection 4 mg  4 mg Intravenous Q6H PRN Norins,  Heinz Knuckles, MD      . pantoprazole (PROTONIX) EC tablet 40 mg  40 mg Oral Daily Norins, Heinz Knuckles, MD   40 mg at 07/04/19 0859  . potassium chloride SA (KLOR-CON) CR tablet 10 mEq  10 mEq Oral BID Neena Rhymes, MD   10 mEq at 07/04/19 2049  . sertraline (ZOLOFT) tablet 50 mg  50 mg Oral q morning - 10a Norins, Heinz Knuckles, MD   50 mg at 07/04/19 0900  . simvastatin (ZOCOR) tablet 10 mg  10 mg Oral q1800 Norins, Heinz Knuckles, MD   10 mg at 07/03/19 1900  . sodium chloride flush (NS) 0.9 % injection 3 mL  3 mL Intravenous Q12H Norins, Heinz Knuckles, MD   3 mL at 07/03/19 2301  . sodium chloride flush (NS) 0.9 % injection 3 mL  3 mL Intravenous PRN Norins, Heinz Knuckles, MD         Discharge Medications: Please see discharge summary for a list of discharge medications.  Relevant Imaging Results:  Relevant Lab Results:   Additional Information SSN : [place]  Angellee Cohill, Chauncey Reading, RN

## 2019-07-05 NOTE — Progress Notes (Signed)
ANTICOAGULATION CONSULT NOTE -   Pharmacy Consult for warfarin dosing  Indication: atrial fibrillation   Allergies  Allergen Reactions  . Lipitor [Atorvastatin] Other (See Comments)    Unknown rxn per pt      Patient Measurements: Last Weight  Most recent update: 07/05/2019  2:08 AM   Weight  70.6 kg (155 lb 10.3 oz)           Body mass index is 25.9 kg/m. Scott [name]                  Labs: Recent Labs    07/02/19 1247 07/02/19 1247 07/03/19 0523 07/04/19 0526 07/05/19 0610  HGB 12.3*   < > 11.7*  --  12.1*  HCT 39.5  --  37.9*  --  40.2  PLT 120*  --  119*  --  153  LABPROT  --   --  30.0* 38.2* 37.4*  INR  --   --  2.9* 3.9* 3.8*  CREATININE 2.26*  --  1.94*  --  1.37*   < > = values in this interval not displayed.    Estimated Creatinine Clearance: 28.1 mL/min (A) (by C-G formula based on SCr of 1.37 mg/dL (H)).     Medications:  Medications Prior to Admission  Medication Sig Dispense Refill Last Dose  . acetaminophen (TYLENOL) 325 MG tablet Take 2 tablets (650 mg total) by mouth every 6 (six) hours as needed for mild pain (or Fever >/= 101).   06/30/2019 at 1900  . albuterol (PROVENTIL) (2.5 MG/3ML) 0.083% nebulizer solution NEBULIZE 1 VIAL EVERY 6 HOURS AS NEEDED FOR WHEEZING OR SHORTNESS OF BREATH (Patient taking differently: Take 2.5 mg by nebulization every 6 (six) hours as needed for wheezing or shortness of breath. ) 180 mL 3 07/01/2019 at Unknown time  . albuterol (VENTOLIN HFA) 108 (90 Base) MCG/ACT inhaler Inhale 2 puffs into the lungs every 6 (six) hours as needed for wheezing or shortness of breath. 18 g 3 07/01/2019 at Unknown time  . aspirin EC 81 MG EC tablet Take 1 tablet (81 mg total) by mouth daily.   06/30/2019 at 0930  . carvedilol (COREG) 6.25 MG tablet TAKE  (1)  TABLET TWICE A DAY. (Patient taking differently: Take 6.25 mg by mouth 2 (two) times daily with a meal. ) 60 tablet 0 06/30/2019 at 1900  . COMBIVENT RESPIMAT 20-100 MCG/ACT AERS  respimat 1 PUFF EVERY 4 HOURS AS NEEDED FOR COUGH, WHEEZING, OR SHORTNESS OF BREATH 4 g 0 06/30/2019 at Unknown time  . donepezil (ARICEPT) 10 MG tablet TAKE ONE TABLET AT BEDTIME 30 tablet 0 06/30/2019 at Unknown time  . febuxostat (ULORIC) 40 MG tablet TAKE 1 TABLET DAILY 30 tablet 0 06/30/2019 at Unknown time  . fluticasone (FLONASE) 50 MCG/ACT nasal spray Place 1 spray into both nostrils daily as needed for allergies or rhinitis. (Patient taking differently: Place 1 spray into both nostrils at bedtime. ) 16 g 2 06/30/2019 at Unknown time  . folic acid (FOLVITE) 1 MG tablet TAKE 1 TABLET DAILY 30 tablet 2 06/30/2019 at Unknown time  . furosemide (LASIX) 40 MG tablet TAKE 1 TABLET 2 TIMES A DAY 60 tablet 0 06/30/2019 at Unknown time  . HYDROcodone-acetaminophen (NORCO/VICODIN) 5-325 MG tablet Take 1 tablet by mouth 2 (two) times daily as needed for moderate pain. 60 tablet 0 Past Month at Unknown time  . ipratropium (ATROVENT) 0.02 % nebulizer solution Take 0.5 mg by nebulization every 6 (six) hours as  needed for wheezing or shortness of breath.   06/30/2019 at Unknown time  . MYRBETRIQ 25 MG TB24 tablet Take 25 mg by mouth every morning.    06/30/2019 at Unknown time  . pantoprazole (PROTONIX) 40 MG tablet TAKE 1 TABLET DAILY 90 tablet 0 06/30/2019 at Unknown time  . sertraline (ZOLOFT) 50 MG tablet TAKE 1 TABLET IN THE MORNING 30 tablet 2 06/30/2019 at Unknown time  . simvastatin (ZOCOR) 10 MG tablet TAKE 1 TABLET ONCE DAILY IN THE EVENING (Patient taking differently: Take 10 mg by mouth daily at 6 PM. ) 90 tablet 0 06/30/2019 at Unknown time  . warfarin (COUMADIN) 2.5 MG tablet Take 2.5 mg by mouth daily except Mondays and Fridays take 5 mg 40 tablet 2 06/30/2019 at 0930   Scheduled:  . aspirin EC  81 mg Oral Daily  . azithromycin  250 mg Oral Daily  . carvedilol  6.25 mg Oral BID WC  . Chlorhexidine Gluconate Cloth  6 each Topical Daily  . donepezil  10 mg Oral QHS  . febuxostat  40 mg Oral Daily  .  fluticasone  1 spray Each Nare QHS  . folic acid  1 mg Oral Daily  . furosemide  40 mg Intravenous BID  . gabapentin  200 mg Oral BID  . guaiFENesin  600 mg Oral BID  . isosorbide dinitrate  20 mg Oral TID   And  . hydrALAZINE  37.5 mg Oral TID  . mouth rinse  15 mL Mouth Rinse BID  . mirabegron ER  25 mg Oral q morning - 10a  . pantoprazole  40 mg Oral Daily  . potassium chloride  10 mEq Oral BID  . sertraline  50 mg Oral q morning - 10a  . simvastatin  10 mg Oral q1800  . sodium chloride flush  3 mL Intravenous Q12H   Infusions:  . sodium chloride    . dextrose 75 mL/hr at 07/05/19 0300   PRN: sodium chloride, acetaminophen, HYDROcodone-acetaminophen, ipratropium-albuterol, LORazepam, ondansetron (ZOFRAN) IV, sodium chloride flush Anti-infectives (From admission, onward)   Start     Dose/Rate Route Frequency Ordered Stop   07/02/19 1600  azithromycin (ZITHROMAX) tablet 250 mg     250 mg Oral Daily 07/01/19 1812 07/06/19 1559   07/02/19 1500  cefTRIAXone (ROCEPHIN) 1 g in sodium chloride 0.9 % 100 mL IVPB  Status:  Discontinued     1 g 200 mL/hr over 30 Minutes Intravenous Every 24 hours 07/01/19 1812 07/03/19 1812   07/01/19 1500  cefTRIAXone (ROCEPHIN) 1 g in sodium chloride 0.9 % 100 mL IVPB     1 g 200 mL/hr over 30 Minutes Intravenous  Once 07/01/19 1452 07/01/19 1549   07/01/19 1500  azithromycin (ZITHROMAX) 500 mg in sodium chloride 0.9 % 250 mL IVPB  Status:  Discontinued     500 mg 250 mL/hr over 60 Minutes Intravenous Every 24 hours 07/01/19 1452 07/01/19 2048      Goal of Therapy:  INR 2-3 Monitor platelets by anticoagulation protocol: Yes    Prior to Admission Warfarin Dosing:  Scott [name] takes 2.5mg  of warfarin everyday of the week except MF, takes 5mg       Lab Results  Component Value Date   INR 3.8 (H) 07/05/2019   INR 3.9 (H) 07/04/2019   INR 2.9 (H) 07/03/2019    Assessment: Scott [name] a 86 y.o. male requires anticoagulation with warfarin  for the indication of  atrial fibrillation. Warfarin will be  initiated inpatient following pharmacy protocol per pharmacy consult. Patient most recent blood work is as follows: CBC Latest Ref Rng & Units 07/05/2019 07/03/2019 07/02/2019  WBC 4.0 - 10.5 K/uL 9.3 10.9(H) 11.3(H)  Hemoglobin 13.0 - 17.0 g/dL 12.1(L) 11.7(L) 12.3(L)  Hematocrit 39.0 - 52.0 % 40.2 37.9(L) 39.5  Platelets 150 - 400 K/uL 153 119(L) 120(L)   Patient with hematuria with blood clots  Plan: INR 2.1 > 2.9>3.9>3.8. Hold warfarin again. Monitor CBC MWF with am labs   Monitor INR daily Monitor for signs and symptoms of bleeding   Margot Ables, PharmD Clinical Pharmacist 07/05/2019 9:03 AM

## 2019-07-05 NOTE — Progress Notes (Signed)
[place] A. Merlene Laughter, MD     [email or web address]          Scott [name] is an 86 y.o. male.   ASSESSMENT/PLAN: 1.  Fluctuating level of arousal possibly related to ceftriaxone, this has been discontinued.  The patient also could be having complex partial seizure but he does not have a history of this and EEG has been unrevealing.   Currently he is not on Keppra. He is on moderate dose of gabapentin for his age. One possibility would be to increase this to treat his pain and also any possible seizures.  For now we will not make any adjustments to his medications but an EEG will be obtained for reevaluation. 2.  Community-acquired pneumonia ??? 3.  Remote left basal ganglia infarct 4.  Atrial fibrillation 5.  Cognitive impairment    The patient's son is at the bedside.  The son reports that his cognitive issues have only been modestly better.  He is concerned.  He reports that his father is typically very sharp and has been in the hospital multiple times not having significant cognitive impairment.   GENERAL: This is a thin somewhat frail-appearing male in no acute distress.  Slight wheezings are noted but he is not dyspneic or tachypneic.  He is on 2 L oxygen.  HEENT:  Neck is suppl, no trauma appreciated.  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  BACK: Normal  SKIN: Normal by inspection.    MENTAL STATUS: He lays in bed with eyes closed.  He does open his eyes to he lays in bed verbal commands and spontaneously.  He follows commands well.  He knows that he is in the hospital but thinks he is in [place].  He states the year is 2021 but the month is May.  CRANIAL NERVES: The left eye is somewhat clouded with nonreactive pupil.  The right is mid position approximately 5 mm and reactive.  Extraocular movements are intact.  There is no significant nystagmus; visual fields are full; upper and lower facial muscles are normal  in strength and symmetric, there is no flattening of the nasolabial folds; tongue is midline; uvula is midline; shoulder elevation is normal.  MOTOR: He has about 4/strength in the upper extremities.  Bulk and tone are normal.  He has antigravity strength in the legs.  COORDINATION: Left finger to nose is normal, right finger to nose is normal, No rest tremor; no intention tremor; no postural tremor; no bradykinesia.  REFLEXES: Deep tendon reflexes are symmetrical and normal.   SENSATION: Normal to pain.    Blood pressure 125/70, pulse 66, temperature 98.2 F (36.8 C), temperature source Oral, resp. rate 14, height 5\' 5"  (1.651 m), weight 70.6 kg, SpO2 100 %.  Past Medical History:  Diagnosis Date  . Arthritis    "all over"  . Atrial fibrillation ([place])   . Benign localized prostatic hyperplasia with lower urinary tract symptoms (LUTS)   . Bradycardia 11/28/2015   Severe-HR in the 20s to 30s following intubation for urological procedure.  Marland Kitchen CAD- non obstructive disease by cath 3/14 cardiologist-  dr hochrein  . Chronic lower back pain   . COPD (chronic obstructive pulmonary disease) ([place])   . DDD (degenerative disc disease)   . Depression   . Dyspnea    w/ exertion and lying down (raise head)  . ETOH abuse   . GERD (gastroesophageal reflux disease)   . Glaucoma   . Glaucoma,  both eyes   . History of bladder cancer urologist-- dr Jeffie Pollock   first dx 2012--  s/p TURBT's  . History of DVT of lower extremity 03/2013   bilateral   . History of pulmonary embolus (PE) 04/2013  . HTN (hypertension)   . Hyperlipidemia   . Hypertension   . LBBB (left bundle branch block)    chronic  . Non-ischemic cardiomyopathy ([place])    last echo 01-18-2017, ef 35-40%  . NSVT (nonsustained ventricular tachycardia) ([place])    a. NSVT 06/2012; NSVT also seen during 03/2013 adm. b. Med rx. Not candidate for ICD given adv age.  Marland Kitchen PAD (peripheral artery disease) ([place])   . Popliteal artery aneurysm,  bilateral (HCC)    DOCUMENTED CHRONIC PARTIAL OCCLUSION--  PT DENIES CLAUDICATION OR ANY OTHER SYMPTOMS  . PVCs (premature ventricular contractions)   . PVD (peripheral vascular disease) (H[facility])    Right ABI .75, Left .78 (2006)  . Syncope 06/2012   a. Felt to be postural syncope related to diuretics 06/2012.  Marland Kitchen Systolic and diastolic CHF, chronic (HCC) cardiologsit-  dr hochrein   a. NICM - patent cors 06/2012, EF 40% at that time. b. 03/2013 eval: EF 20-25%.  . Vision loss, left eye    due to glaucoma  . Wears glasses     Past Surgical History:  Procedure Laterality Date  . AMPUTATION Left 10/16/2014   Procedure: AMPUTATION ABOVE KNEE- LEFT;  Surgeon: Mal Misty, MD;  Location: [facility];  Service: Vascular;  Laterality: Left;  . CARDIAC CATHETERIZATION  05-11-2004  DR [facility]   MINIMAL CORANARY PLAQUE/ NORMAL LVF/ EF 55%/  NON-OBSTRUCTIVE LAD 25%  . CARDIAC CATHETERIZATION  06/18/2012   dr hochrein   mild luminal irregularities of coronaries, ef 45-50%  . CARDIOVASCULAR STRESS TEST  10-15-2010   LOW RISK NUCLEAR STUDY/ NO EVIDENCE OF ISCHEMIA/ NORMAL EF  . CATARACT EXTRACTION W/ INTRAOCULAR LENS  IMPLANT, BILATERAL Bilateral   . CYSTOSCOPY  10/07/2011   Procedure: CYSTOSCOPY;  Surgeon: Malka So, MD;  Location: [facility];  Service: Urology;  Laterality: N/A;  . CYSTOSCOPY N/A 10/20/2018   Procedure: CYSTOSCOPY WITH FULGURATION OF PROSTATIC URETHRAL TUMOR;  Surgeon: [name], MD;  Location: [facility];  Service: Urology;  Laterality: N/A;  . CYSTOSCOPY WITH BIOPSY  03/14/2012   Procedure: CYSTOSCOPY WITH BIOPSY;  Surgeon: Malka So, MD;  Location: [facility];  Service: Urology;  Laterality: N/A;  WITH FULGURATION  . CYSTOSCOPY WITH BIOPSY N/A 12/09/2016   Procedure: CYSTOSCOPY WITH BIOPSY AND FULGURATION;  Surgeon: [name], MD;  Location: [facility];  Service: Urology;  Laterality: N/A;  . CYSTOSCOPY WITH BIOPSY N/A 12/20/2017   Procedure: CYSTOSCOPY WITH BIOPSY WITH  FULGURATION;  Surgeon: [name], MD;  Location: [facility];  Service: Urology;  Laterality: N/A;  . CYSTOSCOPY WITH BIOPSY N/A 06/09/2018   Procedure: CYSTOSCOPY WITH BIOPSY AND FULGURATION;  Surgeon: [name], MD;  Location: [facility];  Service: Urology;  Laterality: N/A;  . CYSTOSCOPY WITH FULGERATION N/A 01/20/2016   Procedure: CYSTOSCOPY, BIOPSY WITH FULGERATION OF URTHRAL TUMOR;  Surgeon: [name], MD;  [facility];  Service: Urology;  Laterality: N/A;  . CYSTOSCOPY WITH FULGERATION N/A 06/01/2016   Procedure: CYSTOSCOPY WITH FULGERATION urethral tumors and bladder neck;  Surgeon: [name], MD;  Location[facility];  Service: Urology;  Laterality: N/A;  . SHOULDER SURGERY Left 1970's  . TONSILLECTOMY    . TRANSTHORACIC ECHOCARDIOGRAM  01-18-2017   dr hochrein  moderate LVH, ef 35-40%, diffuse hypokinesis/  trivial AR and MR/  mild TR  . TRANSURETHRAL RESECTION OF BLADDER TUMOR  10/07/2011   Procedure: TRANSURETHRAL RESECTION OF BLADDER TUMOR (TURBT);  Surgeon: Malka So, MD;  Location: [facility];  Service: Urology;  Laterality: N/A;  . TRANSURETHRAL RESECTION OF BLADDER TUMOR N/A 07/10/2015   Procedure: TRANSURETHRAL RESECTION OF BLADDER TUMOR (TURBT), CYSTOSCOPY ;  Surgeon: [name], MD;  Location[facility];  Service: Urology;  Laterality: N/A;  . TRANSURETHRAL RESECTION OF PROSTATE  03/14/2012   Procedure: TRANSURETHRAL RESECTION OF THE PROSTATE WITH GYRUS INSTRUMENTS;  Surgeon: Malka So, MD;  Location[facility];  Service: Urology;  Laterality: N/A;    Family History  Problem Relation Age of Onset  . Hypertension Mother   . Arthritis Mother   . Lung cancer Sister   . Deep vein thrombosis Father   . Early death Brother     Social History:  reports that he quit smoking about 44 years ago. His smoking use included cigarettes. He started smoking about 77 years ago. He has a 35.00 pack-year smoking history. He has never used smokeless tobacco. He reports previous  alcohol use of about 7.0 standard drinks of alcohol per week. He reports that he does not use drugs.  Allergies:  Allergies  Allergen Reactions  . Lipitor [Atorvastatin] Other (See Comments)    Unknown rxn per pt    Medications: Prior to Admission medications   Medication Sig Start Date End Date Taking? Authorizing Provider  acetaminophen (TYLENOL) 325 MG tablet Take 2 tablets (650 mg total) by mouth every 6 (six) hours as needed for mild pain (or Fever >/= 101). 12/26/18  Yes Elgergawy, Silver Huguenin, MD  albuterol (PROVENTIL) (2.5 MG/3ML) 0.083% nebulizer solution NEBULIZE 1 VIAL EVERY 6 HOURS AS NEEDED FOR WHEEZING OR SHORTNESS OF BREATH Patient taking differently: Take 2.5 mg by nebulization every 6 (six) hours as needed for wheezing or shortness of breath.  09/07/18  Yes Dettinger, Fransisca Kaufmann, MD  albuterol (VENTOLIN HFA) 108 (90 Base) MCG/ACT inhaler Inhale 2 puffs into the lungs every 6 (six) hours as needed for wheezing or shortness of breath. 05/17/19  Yes Dettinger, Fransisca Kaufmann, MD  aspirin EC 81 MG EC tablet Take 1 tablet (81 mg total) by mouth daily. 12/12/18  Yes Tat, [name], MD  carvedilol (COREG) 6.25 MG tablet TAKE  (1)  TABLET TWICE A DAY. Patient taking differently: Take 6.25 mg by mouth 2 (two) times daily with a meal.  06/22/19  Yes Dettinger, Fransisca Kaufmann, MD  COMBIVENT RESPIMAT 20-100 MCG/ACT AERS respimat 1 PUFF EVERY 4 HOURS AS NEEDED FOR COUGH, WHEEZING, OR SHORTNESS OF BREATH 06/28/19  Yes Dettinger, Fransisca Kaufmann, MD  donepezil (ARICEPT) 10 MG tablet TAKE ONE TABLET AT BEDTIME 04/26/19  Yes Dettinger, Fransisca Kaufmann, MD  febuxostat (ULORIC) 40 MG tablet TAKE 1 TABLET DAILY 04/26/19  Yes Dettinger, Fransisca Kaufmann, MD  fluticasone (FLONASE) 50 MCG/ACT nasal spray Place 1 spray into both nostrils daily as needed for allergies or rhinitis. Patient taking differently: Place 1 spray into both nostrils at bedtime.  09/07/18  Yes Dettinger, Fransisca Kaufmann, MD  folic acid (FOLVITE) 1 MG tablet TAKE 1 TABLET DAILY 05/24/19   Yes Dettinger, Fransisca Kaufmann, MD  furosemide (LASIX) 40 MG tablet TAKE 1 TABLET 2 TIMES A DAY 06/28/19  Yes Dettinger, Fransisca Kaufmann, MD  HYDROcodone-acetaminophen (NORCO/VICODIN) 5-325 MG tablet Take 1 tablet by mouth 2 (two) times daily as needed for moderate  pain. 01/30/19  Yes Dettinger, Fransisca Kaufmann, MD  ipratropium (ATROVENT) 0.02 % nebulizer solution Take 0.5 mg by nebulization every 6 (six) hours as needed for wheezing or shortness of breath.   Yes [provider]  MYRBETRIQ 25 MG TB24 tablet Take 25 mg by mouth every morning.  09/20/16  Yes [provider]  pantoprazole (PROTONIX) 40 MG tablet TAKE 1 TABLET DAILY 05/16/19  Yes Dettinger, Fransisca Kaufmann, MD  sertraline (ZOLOFT) 50 MG tablet TAKE 1 TABLET IN THE MORNING 05/24/19  Yes Dettinger, Fransisca Kaufmann, MD  simvastatin (ZOCOR) 10 MG tablet TAKE 1 TABLET ONCE DAILY IN THE EVENING Patient taking differently: Take 10 mg by mouth daily at 6 PM.  05/15/19  Yes Dettinger, Fransisca Kaufmann, MD  warfarin (COUMADIN) 2.5 MG tablet Take 2.5 mg by mouth daily except Mondays and Fridays take 5 mg 05/24/19  Yes Dettinger, Fransisca Kaufmann, MD  BIDIL 20-37.5 MG tablet TAKE 1 TABLET BY MOUTH 3 TIMES DAILY. 07/03/19   Dettinger, Fransisca Kaufmann, MD  gabapentin (NEURONTIN) 100 MG capsule TAKE 2 CAPSULES TWICE A DAY 07/03/19   Dettinger, Fransisca Kaufmann, MD    Scheduled Meds: . aspirin EC  81 mg Oral Daily  . carvedilol  6.25 mg Oral BID WC  . donepezil  10 mg Oral QHS  . febuxostat  40 mg Oral Daily  . fluticasone  1 spray Each Nare QHS  . folic acid  1 mg Oral Daily  . furosemide  40 mg Intravenous BID  . gabapentin  200 mg Oral BID  . guaiFENesin  600 mg Oral BID  . isosorbide dinitrate  20 mg Oral TID   And  . hydrALAZINE  37.5 mg Oral TID  . mouth rinse  15 mL Mouth Rinse BID  . mirabegron ER  25 mg Oral q morning - 10a  . pantoprazole  40 mg Oral Daily  . potassium chloride  10 mEq Oral BID  . sertraline  50 mg Oral q morning - 10a  . simvastatin  10 mg Oral q1800  . sodium  chloride flush  3 mL Intravenous Q12H   Continuous Infusions: . sodium chloride    . dextrose 50 mL/hr at 07/05/19 0930   PRN Meds:.sodium chloride, acetaminophen, HYDROcodone-acetaminophen, ipratropium-albuterol, LORazepam, ondansetron (ZOFRAN) IV, sodium chloride flush     Results for orders placed or performed during the hospital encounter of 07/01/19 (from the past 48 hour(s))  Protime-INR     Status: Abnormal   Collection Time: 07/04/19  5:26 AM  Result Value Ref Range   Prothrombin Time 38.2 (H) 11.4 - 15.2 seconds   INR 3.9 (H) 0.8 - 1.2    Comment: (NOTE) INR goal varies based on device and disease states. Performed at [facility], [address] [place], [place] [address]   Glucose, capillary     Status: Abnormal   Collection Time: 07/04/19  6:21 PM  Result Value Ref Range   Glucose-Capillary 108 (H) 70 - 99 mg/dL    Comment: Glucose reference range applies only to samples taken after fasting for at least 8 hours.  Glucose, capillary     Status: Abnormal   Collection Time: 07/05/19 12:02 AM  Result Value Ref Range   Glucose-Capillary 118 (H) 70 - 99 mg/dL    Comment: Glucose reference range applies only to samples taken after fasting for at least 8 hours.   Comment 1 Notify RN    Comment 2 Document in Chart   Glucose, capillary  Status: Abnormal   Collection Time: 07/05/19  5:34 AM  Result Value Ref Range   Glucose-Capillary 126 (H) 70 - 99 mg/dL    Comment: Glucose reference range applies only to samples taken after fasting for at least 8 hours.  Protime-INR     Status: Abnormal   Collection Time: 07/05/19  6:10 AM  Result Value Ref Range   Prothrombin Time 37.4 (H) 11.4 - 15.2 seconds   INR 3.8 (H) 0.8 - 1.2    Comment: (NOTE) INR goal varies based on device and disease states. Performed at [facility], [address] [place], [place] [address]   CBC     Status: Abnormal   Collection Time: 07/05/19  6:10 AM  Result Value Ref Range   WBC 9.3 4.0 - 10.5  K/uL   RBC 4.46 4.22 - 5.81 MIL/uL   Hemoglobin 12.1 (L) 13.0 - 17.0 g/dL   HCT 40.2 39.0 - 52.0 %   MCV 90.1 80.0 - 100.0 fL   MCH 27.1 26.0 - 34.0 pg   MCHC 30.1 30.0 - 36.0 g/dL   RDW 15.4 11.5 - 15.5 %   Platelets 153 150 - 400 K/uL   nRBC 0.0 0.0 - 0.2 %    Comment: Performed at [facility], [address] [place], [place] [id number]  Basic metabolic panel     Status: Abnormal   Collection Time: 07/05/19  6:10 AM  Result Value Ref Range   Sodium 143 135 - 145 mmol/L   Potassium 4.6 3.5 - 5.1 mmol/L   Chloride 108 98 - 111 mmol/L   CO2 25 22 - 32 mmol/L   Glucose, Bld 138 (H) 70 - 99 mg/dL    Comment: Glucose reference range applies only to samples taken after fasting for at least 8 hours.   BUN 56 (H) 8 - 23 mg/dL   Creatinine, Ser 1.37 (H) 0.61 - 1.24 mg/dL   Calcium 9.2 8.9 - 10.3 mg/dL   GFR calc non Af Amer 44 (L) >60 mL/min   GFR calc Af Amer 50 (L) >60 mL/min   Anion gap 10 5 - 15    Comment: Performed at [facility], [address] [place], [place] [address]  Glucose, capillary     Status: Abnormal   Collection Time: 07/05/19 11:20 AM  Result Value Ref Range   Glucose-Capillary 109 (H) 70 - 99 mg/dL    Comment: Glucose reference range applies only to samples taken after fasting for at least 8 hours.  Glucose, capillary     Status: Abnormal   Collection Time: 07/05/19  5:05 PM  Result Value Ref Range   Glucose-Capillary 107 (H) 70 - 99 mg/dL    Comment: Glucose reference range applies only to samples taken after fasting for at least 8 hours.   Comment 1 Notify RN    Comment 2 Document in Chart    *Note: Due to a large number of results and/or encounters for the requested time period, some results have not been displayed. A complete set of results can be found in Results Review.    Studies/Results:  EEG Technical aspects: This EEG study was done with scalp electrodes positioned according to the 10-20 International system of electrode placement. Electrical  activity was acquired at a sampling rate of 500Hz  and reviewed with a high frequency filter of 70Hz  and a low frequency filter of 1Hz . EEG data were recorded continuously and digitally stored.   Description: No clear posterior dominant rhythm was seen.  Sleep was characterized by vertex waves, maximal frontocentral region.  EEG showed continuous generalized 6-8 Hz theta-alpha activity.     Hyperventilation and photic stimulation were not performed.    Abnormality - Continuous slow, generalized  IMPRESSION: This study is suggestive of mild to moderate diffuse encephalopathy. No seizures or epileptiform discharges were seen throughout the recording.    HEAD NECK CTA - PERFUSION   FINDINGS: CTA NECK FINDINGS  Aortic arch: Examination technically limited by timing of the contrast bolus, with poor opacification of the arterial system.  Visualized aortic arch of normal caliber with normal branch pattern. Moderate atherosclerotic change about the arch and origin of the great vessels without hemodynamically significant stenosis. Visualized subclavian arteries widely patent.  Right carotid system: Right CCA tortuous proximally but is widely patent to the bifurcation without flow-limiting stenosis. Extensive bulky calcified plaque about the right bifurcation/proximal right ICA with resultant severe stenosis of at least 75% by NASCET criteria. Right ICA otherwise patent distally to the skull base without stenosis, dissection, or occlusion.  Left carotid system: Left CCA mildly tortuous but patent to the bifurcation without flow-limiting stenosis. Extensive bulky plaque about the left bifurcation/proximal left ICA with resultant severe stenosis of up to left ICA otherwise patent to the skull base without stenosis, dissection or occlusion. Approximately 80% by NASCET criteria.  Vertebral arteries: Both vertebral arteries arise from the subclavian arteries. Atheromatous plaque at the  origin of the vertebral arteries bilaterally with estimated 50-75% ostial stenosis bilaterally, left greater than right. Left vertebral artery slightly dominant. Vertebral arteries otherwise patent within the neck without stenosis, dissection, or occlusion.  Skeleton: No acute osseous abnormality. No discrete or worrisome osseous lesions. Exaggeration of the normal thoracic kyphosis noted. Degenerative changes noted about the left TMJ.  Other neck: No other acute soft tissue abnormality within the neck. No mass lesion or adenopathy.  Upper chest: Mild scattered atelectatic changes noted within the visualized lungs. Underlying emphysema. Cardiomegaly with scattered coronary artery calcifications partially visualized. Visualized upper chest demonstrates no other acute finding.  Review of the MIP images confirms the above findings  CTA HEAD FINDINGS  Anterior circulation: Examination of the intracranial circulation technically limited by timing of the contrast bolus. Petrous segments patent bilaterally. Extensive calcified plaque throughout the carotid siphons with associated moderate to severe diffuse narrowing. ICA termini well perfused. A1 segments patent bilaterally. Normal anterior communicating artery. Anterior cerebral arteries patent to their distal aspects without stenosis. No M1 stenosis or occlusion. Normal MCA bifurcations. Distal MCA branches well perfused and symmetric.  Posterior circulation: Mild nonstenotic plaque noted within the proximal left V4 segment without significant stenosis. Left vertebral otherwise widely patent to the vertebrobasilar junction. Focal plaque within the proximal right V4 segment with short-segment mild to moderate stenosis. Left PICA patent. Right PICA not well seen. Basilar patent to its distal aspect without stenosis. Superior cerebral arteries patent bilaterally. Both PCAs primarily supplied via the basilar. PCAs well perfused  to their distal aspects without stenosis.  Venous sinuses: Not well assessed due to timing of the contrast bolus.  Anatomic variants: None significant.  Review of the MIP images confirms the above findings  CT Brain Perfusion Findings:  ASPECTS: 10  CT perfusion portion of this exam failed due to poor timing of the contrast bolus. Study is nondiagnostic for evaluation of acute ischemia or perfusion abnormality.  IMPRESSION: CTA HEAD AND NECK IMPRESSION:  1. Technically limited exam due to timing of the contrast bolus. 2. Negative CTA for emergent large vessel  occlusion. 3. Heavy atheromatous plaque about the carotid bifurcations with associated stenoses of up to at least 75-80% by NASCET criteria bilaterally, left slightly worse than right. 4. Atheromatous plaque at the origins of both vertebral arteries with estimated moderate to severe 50-75% stenoses bilaterally. Left vertebral artery dominant. 5. Extensive calcified plaque throughout the carotid siphons with associated moderate to severe diffuse narrowing. 6. Aortic Atherosclerosis (ICD10-I70.0) and Emphysema (ICD10-J43.9).  CT PERFUSION IMPRESSION:  CT perfusion portion of this exam failed due to poor timing of the contrast bolus. Examination is nondiagnostic for evaluation of possible ischemia or other perfusion abnormality.    BRAIN MRI FINDINGS: Brain: No restricted diffusion or evidence of acute infarction. Chronic lacunar type infarct of the left corona radiata and extending to the left deep gray nuclei.  Occasional superimposed small areas of chronic cortical encephalomalacia such as the right superior frontal gyrus on series 8, image 42, pre motor area. Scattered other white matter T2 and FLAIR hyperintensity, although generally mild for age. There are occasional chronic micro hemorrhages suspected (left periatrial white matter series 9, image 12). Brainstem and cerebellum appear largely  negative.  Cavum septum pellucidum, normal variant. No midline shift, mass effect, evidence of mass lesion, ventriculomegaly, extra-axial collection or acute intracranial hemorrhage. Cervicomedullary junction and pituitary are within normal limits.  Vascular: Major intracranial vascular flow voids is are preserved.  Skull and upper cervical spine: Degenerative ligamentous hypertrophy about the odontoid. Visualized bone marrow signal is within normal limits.  Sinuses/Orbits: Postoperative changes to both globes, otherwise negative orbits. Paranasal sinus mucosal thickening is stable and most pronounced in the left maxillary sinus.  Other: Mastoid air cells are well pneumatized. Limited detail of the internal auditory structures. Scalp and face soft tissues appear grossly negative.  IMPRESSION: 1. No acute intracranial abnormality. 2. Chronic small vessel ischemia, most pronounced in the left corona Radiata.    Maysen Bonsignore A. Merlene Laughter, M.D.  Diplomate, Tax adviser of Psychiatry and Neurology ( Neurology). 07/05/2019, 7:48 PM

## 2019-07-06 ENCOUNTER — Ambulatory Visit: Payer: BC Managed Care – PPO | Admitting: Vascular Surgery

## 2019-07-06 ENCOUNTER — Inpatient Hospital Stay (HOSPITAL_COMMUNITY)
Admit: 2019-07-06 | Discharge: 2019-07-06 | Disposition: A | Discharge status: Home / Self Care | Payer: Medicare Other | Attending: Neurology | Admitting: Neurology

## 2019-07-06 ENCOUNTER — Ambulatory Visit (HOSPITAL_COMMUNITY): Admission: RE | Admit: 2019-07-06 | Payer: Medicare Other | Source: Ambulatory Visit

## 2019-07-06 DIAGNOSIS — R569 Unspecified convulsions: Secondary | ICD-10-CM | POA: Diagnosis not present

## 2019-07-06 LAB — BASIC METABOLIC PANEL
Anion gap: 10 (ref 5–15)
BUN: 48 mg/dL — ABNORMAL HIGH (ref 8–23)
CO2: 27 mmol/L (ref 22–32)
Calcium: 9.2 mg/dL (ref 8.9–10.3)
Chloride: 107 mmol/L (ref 98–111)
Creatinine, Ser: 1.23 mg/dL (ref 0.61–1.24)
GFR calc Af Amer: 57 mL/min — ABNORMAL LOW (ref >60)
GFR calc non Af Amer: 50 mL/min — ABNORMAL LOW (ref >60)
Glucose, Bld: 93 mg/dL (ref 70–99)
Potassium: 4.4 mmol/L (ref 3.5–5.1)
Sodium: 144 mmol/L (ref 135–145)

## 2019-07-06 LAB — GLUCOSE, CAPILLARY
Glucose-Capillary: 119 mg/dL — ABNORMAL HIGH (ref 70–99)
Glucose-Capillary: 126 mg/dL — ABNORMAL HIGH (ref 70–99)
Glucose-Capillary: 86 mg/dL (ref 70–99)
Glucose-Capillary: 90 mg/dL (ref 70–99)
Glucose-Capillary: 99 mg/dL (ref 70–99)

## 2019-07-06 LAB — PHOSPHORUS: Phosphorus: 2.8 mg/dL (ref 2.5–4.6)

## 2019-07-06 LAB — CBC
HCT: 39.2 % (ref 39.0–52.0)
Hemoglobin: 11.8 g/dL — ABNORMAL LOW (ref 13.0–17.0)
MCH: 27.4 pg (ref 26.0–34.0)
MCHC: 30.1 g/dL (ref 30.0–36.0)
MCV: 91 fL (ref 80.0–100.0)
Platelets: 143 10*3/uL — ABNORMAL LOW (ref 150–400)
RBC: 4.31 MIL/uL (ref 4.22–5.81)
RDW: 15.5 % (ref 11.5–15.5)
WBC: 5.5 10*3/uL (ref 4.0–10.5)
nRBC: 0 % (ref 0.0–0.2)

## 2019-07-06 LAB — CULTURE, BLOOD (ROUTINE X 2)
Culture: NO GROWTH
Culture: NO GROWTH

## 2019-07-06 LAB — PROTIME-INR
INR: 4 — ABNORMAL HIGH (ref 0.8–1.2)
Prothrombin Time: 39.3 seconds — ABNORMAL HIGH (ref 11.4–15.2)

## 2019-07-06 LAB — MAGNESIUM: Magnesium: 2.4 mg/dL (ref 1.7–2.4)

## 2019-07-06 MED ORDER — METHYLPREDNISOLONE SODIUM SUCC 125 MG IJ SOLR
60.0000 mg | Freq: Three times a day (TID) | INTRAMUSCULAR | Status: AC
  Administered 2019-07-06 (×2): 60 mg via INTRAVENOUS
  Filled 2019-07-06 (×2): qty 2

## 2019-07-06 MED ORDER — GABAPENTIN 300 MG PO CAPS
300.0000 mg | ORAL_CAPSULE | Freq: Two times a day (BID) | ORAL | Status: DC
  Administered 2019-07-06 – 2019-07-09 (×7): 300 mg via ORAL
  Filled 2019-07-06 (×7): qty 1

## 2019-07-06 MED ORDER — PREDNISONE 20 MG PO TABS
40.0000 mg | ORAL_TABLET | Freq: Every day | ORAL | Status: AC
  Administered 2019-07-07 – 2019-07-09 (×3): 40 mg via ORAL
  Filled 2019-07-06 (×4): qty 2

## 2019-07-06 NOTE — Progress Notes (Signed)
Notified that patient has a short burst of wide complex junctional rhythm.  Patient asymptomatic, vitals are stable, and has returned to sinus brady.  On-call MD notified.  Will continue to monitor.

## 2019-07-06 NOTE — Progress Notes (Signed)
  Speech Language Pathology Treatment: Dysphagia  Patient Details Name: Scott [name] MRN: [id number] DOB: 10/03/1921 Today's Date: 07/06/2019 Time: [id number] SLP Time Calculation (min) (ACUTE ONLY): 14 min  Assessment / Plan / Recommendation Clinical Impression  Ongoing diagnostic dysphagia therapy provided targeting trials with mechanical soft lunch tray; Pt required maximal assist and verbal cues to rouse for PO. After being repositioned in bed, Pt consumed D3/mech soft trials with prolonged and uncoordinated oral prep stage with moderate oral residue after the swallow. SLP provided max verbal cues for Pt to "swallow" and note inconsistent expectoration of residue. SLP provided trials of puree and Pt demonstrated improved AP transit and decreased oral residue with puree. Pt did demonstrate one coughing episode after initial sip of thin liquids however with increased verbal cues and smaller sips, no further coughing was noted. Pt was unable to suck through a straw during this treatment.  Recommend downgrade to D1/puree diet and continue with thin liquids. ST will continue to follow for diet tolerance  HPI HPI: 86 y.o. male with medical history significant of DNR/DNI, hx of a fib on coumadin, CAD s/p MI 2019, left BKA, diabetes, presented to the hospital with fever, lethargy, and shortness of breath.  His caretaker at the bedside reports that for the past 3 days or so the patient has been complaining about shortness of breath      SLP Plan  Continue with current plan of care       Recommendations  Diet recommendations: Dysphagia 1 (puree);Thin liquid Liquids provided via: Cup Medication Administration: Whole meds with puree Supervision: Full supervision/cueing for compensatory strategies;Staff to assist with self feeding Compensations: Slow rate;Small sips/bites                Oral Care Recommendations: Oral care BID Follow up Recommendations: Other (comment) SLP Visit Diagnosis:  Dysphagia, unspecified (R13.10) Plan: Continue with current plan of care       Huy Majid H. Roddie Mc, CCC-SLP Speech Language Pathologist       Wende Bushy 07/06/2019, 12:39 PM

## 2019-07-06 NOTE — TOC Progression Note (Signed)
Transition of Care [facility]) - Progression Note    Patient Details  Name: Scott [name] MRN: [id number] Date of Birth: [birth year]  [facility] CM/SW Contact  Thia Olesen, Chauncey Reading, RN Phone Number: 07/06/2019, 2:17 PM  Clinical Narrative:   Patient evaluation changed from [facility] PT to SNF. Family interested in [facility], patient referred. [facility] unable to make a bed offer at this time, will refer to other facilities.     Expected Discharge Plan: [place] Barriers to Discharge: SNF Pending bed offer  Expected Discharge Plan and Services Expected Discharge Plan: [place] arrangements for the past 2 months: Single Family Home                           HH Arranged: RN, PT [facility] Agency: [place] ([facility]) Date [place]: 07/02/19 Time [place]: [place] Representative spoke with at [place]: Romualdo Bolk: [phone]   Social Determinants of Health ([place]) Interventions    Readmission Risk Interventions Readmission Risk Prevention Plan 07/02/2019 12/11/2018 06/23/2018  Transportation Screening Complete Complete Complete  PCP or Specialist Appt within 3-5 Days - - Complete  HRI or Home Care Consult - - Not Complete  HRI or Home Care Consult comments - - patient declines  Social Work Scientific laboratory technician for [place] Planning/Counseling - - Complete  Palliative Care Screening - - Not Applicable  Medication Review Press photographer) Complete Complete Complete  PCP or Specialist appointment within 3-5 days of discharge Not Complete Complete -  [place] or Home Care Consult Complete Not Complete -  [place] or Home Care Consult Pt Refusal Comments - not ready -  SW Recovery Care/Counseling Consult Complete Complete -  Palliative Care Screening Not Applicable Not Applicable -  [place] Not Complete Not Complete -  SNF Comments - 24/7 care -  Some recent data might be hidden

## 2019-07-06 NOTE — Care Management Important Message (Signed)
Important Message  Patient Details  Name: Scott [name] MRN: [id number] Date of Birth: [birth year]   Medicare Important Message Given:  Yes     [name] 07/06/2019, 1:17 PM

## 2019-07-06 NOTE — Progress Notes (Signed)
EEG completed, results pending. 

## 2019-07-06 NOTE — Procedures (Signed)
[place] A. Merlene Laughter, MD     [email or web address]           HISTORY: This is a 86 year old male who presents with unresponsiveness worrisome for nonconvulsive seizures.  MEDICATIONS:  Current Facility-Administered Medications:  .  0.9 %  sodium chloride infusion, 250 mL, Intravenous, PRN, Norins, Heinz Knuckles, MD .  acetaminophen (TYLENOL) tablet 650 mg, 650 mg, Oral, Q6H PRN, Norins, Heinz Knuckles, MD, 650 mg at 07/02/19 1330 .  aspirin EC tablet 81 mg, 81 mg, Oral, Daily, Norins, Heinz Knuckles, MD, 81 mg at 07/06/19 0946 .  carvedilol (COREG) tablet 6.25 mg, 6.25 mg, Oral, BID WC, Norins, Heinz Knuckles, MD, 6.25 mg at 07/06/19 1659 .  dextrose 5 %-0.45 % sodium chloride infusion, , Intravenous, Continuous, Kyere, Belinda K, NP, Last Rate: 50 mL/hr at 07/06/19 0300, Rate Verify at 07/06/19 0300 .  donepezil (ARICEPT) tablet 10 mg, 10 mg, Oral, QHS, Norins, Heinz Knuckles, MD, 10 mg at 07/05/19 2145 .  febuxostat (ULORIC) tablet 40 mg, 40 mg, Oral, Daily, Norins, Heinz Knuckles, MD, 40 mg at 07/06/19 0945 .  fluticasone (FLONASE) 50 MCG/ACT nasal spray 1 spray, 1 spray, Each Nare, QHS, Norins, Heinz Knuckles, MD, 1 spray at 07/05/19 2147 .  folic acid (FOLVITE) tablet 1 mg, 1 mg, Oral, Daily, Norins, Heinz Knuckles, MD, 1 mg at 07/06/19 0945 .  gabapentin (NEURONTIN) capsule 300 mg, 300 mg, Oral, BID, Emokpae, Courage, MD, 300 mg at 07/06/19 0945 .  guaiFENesin (MUCINEX) 12 hr tablet 600 mg, 600 mg, Oral, BID, Emokpae, Courage, MD, 600 mg at 07/06/19 0945 .  isosorbide dinitrate (ISORDIL) tablet 20 mg, 20 mg, Oral, TID, 20 mg at 07/06/19 1659 **AND** hydrALAZINE (APRESOLINE) tablet 37.5 mg, 37.5 mg, Oral, TID, Norins, Heinz Knuckles, MD, 37.5 mg at 07/06/19 1659 .  HYDROcodone-acetaminophen (NORCO/VICODIN) 5-325 MG per tablet 1 tablet, 1 tablet, Oral, BID PRN, Norins, Heinz Knuckles, MD .  ipratropium-albuterol (DUONEB) 0.5-2.5 (3) MG/3ML nebulizer solution 3 mL, 3 mL, Nebulization, Q4H PRN, Norins, Heinz Knuckles, MD,  3 mL at 07/06/19 0410 .  LORazepam (ATIVAN) injection 0.5 mg, 0.5 mg, Intravenous, Q6H PRN, Emokpae, Courage, MD, 0.5 mg at 07/05/19 2148 .  MEDLINE mouth rinse, 15 mL, Mouth Rinse, BID, Johnson, Clanford L, MD, 15 mL at 07/06/19 0946 .  methylPREDNISolone sodium succinate (SOLU-MEDROL) 125 mg/2 mL injection 60 mg, 60 mg, Intravenous, Q8H, Emokpae, Courage, MD, 60 mg at 07/06/19 1209 .  mirabegron ER (MYRBETRIQ) tablet 25 mg, 25 mg, Oral, q morning - 10a, Norins, Heinz Knuckles, MD, 25 mg at 07/06/19 0945 .  ondansetron (ZOFRAN) injection 4 mg, 4 mg, Intravenous, Q6H PRN, Norins, Heinz Knuckles, MD .  pantoprazole (PROTONIX) EC tablet 40 mg, 40 mg, Oral, Daily, Norins, Heinz Knuckles, MD, 40 mg at 07/06/19 0945 .  potassium chloride SA (KLOR-CON) CR tablet 10 mEq, 10 mEq, Oral, BID, Norins, Heinz Knuckles, MD, 10 mEq at 07/05/19 2145 .  [START ON 07/07/2019] predniSONE (DELTASONE) tablet 40 mg, 40 mg, Oral, Q breakfast, Emokpae, Courage, MD .  sertraline (ZOLOFT) tablet 50 mg, 50 mg, Oral, q morning - 10a, Norins, Heinz Knuckles, MD, 50 mg at 07/06/19 0945 .  simvastatin (ZOCOR) tablet 10 mg, 10 mg, Oral, q1800, Norins, Heinz Knuckles, MD, 10 mg at 07/05/19 1715 .  sodium chloride flush (NS) 0.9 % injection 3 mL, 3 mL, Intravenous, Q12H, Norins, Heinz Knuckles, MD, 3 mL at 07/05/19 2149 .  sodium chloride flush (NS) 0.9 % injection 3 mL, 3 mL, Intravenous,  PRN, Norins, Heinz Knuckles, MD     ANALYSIS: A 16 channel recording using standard 10 20 measurements is conducted for 26 minutes.  The background activity is significant for generalized slowing mostly in the six point five to there is beta activity observed in the frontal areas. Photic stimulation and hyperventilation are not conducted.  There are a couple of episodes of generalized rhythmic delta slowing.  No focal slowing is noted.  No epileptiform activity is noted.    IMPRESSION: 1.  This recording of the awake and drowsy state shows mild global slowing.  No epileptiform  activities are noted.      Aadhira Heffernan A. Merlene Laughter, M.D.  Diplomate, Tax adviser of Psychiatry and Neurology ( Neurology).

## 2019-07-06 NOTE — Progress Notes (Signed)
Patient Demographics:    Scott [name], is a 86 y.o. male, DOB - 1921/11/21, TFT:[id number]  Admit date - 07/01/2019   Admitting Physician Neena Rhymes, MD  Outpatient Primary MD for the patient is Dettinger, Fransisca Kaufmann, MD  LOS - 5   Chief Complaint  Patient presents with  . Altered Mental Status        Subjective:    Scott [name] today has no  emesis,    --more awake No fever  Or chills  Oral intake is not great Wheezing a bit today -I called and updated son--Scott [name]   Assessment  & Plan :    Principal Problem:   Sepsis due to other etiology [facility]) Active Problems:   UTI (urinary tract infection)   Paroxysmal atrial fibrillation (HCC)   Acute combined systolic (congestive) and diastolic (congestive) heart failure (HCC)   Benign localized hyperplasia of prostate with urinary obstruction   Chronic combined systolic and diastolic heart failure (HCC)   NICM (nonischemic cardiomyopathy)- EF 20-25% by Echo 04/12/13   Essential hypertension   Chronic kidney disease (CKD) stage G3a/A1, moderately decreased glomerular filtration rate (GFR) between 45-59 mL/min/1.73 square meter and albuminuria creatinine ratio less than 30 mg/g (HCC)   Goals of care, counseling/discussion   Palliative care encounter  Brief Summary:- 86 y.o. 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 fib, hypertension, chronic kidney disease stage III, COPD, bladder cancer, CAD, left AKA admitted with fevers and sepsis pathophysiology on 07/01/2019 with concerns about urinary source -Now with concerns about  Seizures -Awaiting SNF rehab   A/p 1)Sepsis of unknown source ---on admission pt met sepsis criteria with fevers, tachycardia , tachypnea , Leukocytosis and Hypoxia  -WBC is down to 10.9 from 14.3 however anticipate further rising WBC with initiation of steroid  therapy for COPD -Patient was treated with Rocephin (started 07/01/19) through 07/03/19 --Rocephin was discontinued due to concerns about contribution towards possible seizures -UA suspicious for possible UTI however patient has history of bladder cancer and BPH -Chest x-ray without pneumonia, however patient had wheezing and hypoxia consistent with COPD -blood and urine cultures NGTD  2)HFrEF--- patient with combined diastolic and systolic dysfunction CHF last known EF 35 to 40%, repeat echo pending--- chest x-ray without overt CHF exacerbation, BNP elevated at 1507 (current BNP level which are similar to patient's prior) -Patient with wheezing, shortness of breath and hypoxia--more consistent with COPD exacerbation rather CHF exacerbation -Troponins are elevated peaking at 2252--- trended down, -Patient with history of previous elevated troponins ---Repeat echocardiogram with similar EF to prior -Repeat chest x-ray on 07/03/2019 without acute findings  3)Acute COPD Exacerbation- no definite pneumonia,  - c/n  mucolytics, antibiotics (as above in # 1)  and bronchodilators as ordered, supplemental oxygen as ordered. -Wheezing again, steroids as ordered  4)Possible Seizures-----suspect partial seizures, patient had multiple episodes of "unresponsiveness" concerning for seizures on 07/02/2019 and again on 07/03/2019----caregiver Scott [name] took several videos while patient was unresponsive ---My serial exam leaves me suspicious for possible seizures ---No jerky or grand mal type activity-   Has intermittent episodes of unresponsiveness followed by severe lethargy-suspect post ictal somnolence -- stroke work-up including MRI brain, CT head and CTA head and neck essentially  negative -EEG without acute findings --Patient received IV Keppra on 07/03/2019 and again on 07/04/2019 -As per neurologist okay to stop Keppra -May use IV lorazepam as needed seizures -Change gabapentin to 300 mg twice  daily -Neurology consult from Dr. Merlene Laughter appreciated --Rocephin was discontinued due to concerns about contribution to was possible seizures --Patient becoming more awake  5)H/o PAFib--- on telemetry episodes of tachyarrhythmia --atrial tachycardia -Continue to keep magnesium and potassium in range --Coumadin therapy as below a #8 -Continue Coreg for rate control at 6.25 mg twice daily --- Hematuria noted  6)CKD stage -  IIIb---- appears at baseline,  creatinine on admission= 2.5 , baseline creatinine =  2.19 (01/08/19)  , creatinine is now=  1.2, renally adjust medications, avoid nephrotoxic agents / dehydration  / hypotension -Renal function improving  7)H/o PAD/CAD--- stable, continue aspirin, continue Coreg, -continue simvastatin, continue isosorbide and hydralazine combo -- Coumadin therapy as in #8  8)History of DVT/PE --- PTA was on Coumadin therapy--- INR is supratherapeutic due to lack of oral intake/lack of vitamin K -Continue to hold Coumadin  9)Dementia without behavioral disturbances -Continue Aricept and Zoloft,   10)Acute hypoxic respiratory failure secondary to #1 and #3 above--continue supplemental oxygen  Disposition/Need for in-Hospital Stay- patient unable to be discharged at this time due to ------ no further significant seizures noted Awaiting SNF rehab   code Status : DNR  Family Communication:   (---- Discussed with caregiver Scott [name] at bedside -Also discussed with patient's son at bedside  Consults  :  na  DVT Prophylaxis  : Coumadin therapy SCDs   Lab Results  Component Value Date   PLT 143 (L) 07/06/2019    Inpatient Medications  Scheduled Meds: . aspirin EC  81 mg Oral Daily  . carvedilol  6.25 mg Oral BID WC  . donepezil  10 mg Oral QHS  . febuxostat  40 mg Oral Daily  . fluticasone  1 spray Each Nare QHS  . folic acid  1 mg Oral Daily  . gabapentin  300 mg Oral BID  . guaiFENesin  600 mg Oral BID  . isosorbide dinitrate  20 mg  Oral TID   And  . hydrALAZINE  37.5 mg Oral TID  . mouth rinse  15 mL Mouth Rinse BID  . mirabegron ER  25 mg Oral q morning - 10a  . pantoprazole  40 mg Oral Daily  . potassium chloride  10 mEq Oral BID  . sertraline  50 mg Oral q morning - 10a  . simvastatin  10 mg Oral q1800  . sodium chloride flush  3 mL Intravenous Q12H   Continuous Infusions: . sodium chloride    . dextrose 5 % and 0.45% NaCl 50 mL/hr at 07/06/19 0300   PRN Meds:.sodium chloride, acetaminophen, HYDROcodone-acetaminophen, ipratropium-albuterol, LORazepam, ondansetron (ZOFRAN) IV, sodium chloride flush    Anti-infectives (From admission, onward)   Start     Dose/Rate Route Frequency Ordered Stop   07/02/19 1600  azithromycin (ZITHROMAX) tablet 250 mg     250 mg Oral Daily 07/01/19 1812 07/05/19 1658   07/02/19 1500  cefTRIAXone (ROCEPHIN) 1 g in sodium chloride 0.9 % 100 mL IVPB  Status:  Discontinued     1 g 200 mL/hr over 30 Minutes Intravenous Every 24 hours 07/01/19 1812 07/03/19 1812   07/01/19 1500  cefTRIAXone (ROCEPHIN) 1 g in sodium chloride 0.9 % 100 mL IVPB     1 g 200 mL/hr over 30 Minutes Intravenous  Once 07/01/19  1452 07/01/19 1549   07/01/19 1500  azithromycin (ZITHROMAX) 500 mg in sodium chloride 0.9 % 250 mL IVPB  Status:  Discontinued     500 mg 250 mL/hr over 60 Minutes Intravenous Every 24 hours 07/01/19 1452 07/01/19 2048       Objective:   Vitals:   07/06/19 0410 07/06/19 0444 07/06/19 0610 07/06/19 0942  BP:  138/63    Pulse:  (!) 54  74  Resp:  20    Temp:  97.9 F (36.6 C)    TempSrc:      SpO2: 97% 100%    Weight:   72 kg   Height:        Wt Readings from Last 3 Encounters:  07/06/19 72 kg  01/26/19 86.2 kg  12/26/18 86.2 kg     Intake/Output Summary (Last 24 hours) at 07/06/2019 1059 Last data filed at 07/06/2019 0700 Gross per 24 hour  Intake 1537.63 ml  Output 1550 ml  Net -12.37 ml    Physical Exam Gen:- Awake Alert,  In no apparent distress HEENT:-  [place].AT, No sclera icterus Neck-Supple Neck,No JVD,.  Lungs-improving air movement, no wheezing CV- S1, S2 normal, irregularly irregular,  Abd-  +ve B.Sounds, Abd Soft, No tenderness, no CVA tenderness Extremity/Skin:- No  edema, Rt pedal pulses present, Lt AKA Psych-more awake, less lethargic  neuro-generalized weakness no new focal deficits, no tremors   Data Review:   Micro Results Recent Results (from the past 240 hour(s))  Blood Culture (routine x 2)     Status: None   Collection Time: 07/01/19  2:52 PM   Specimen: BLOOD RIGHT ARM  Result Value Ref Range Status   Specimen Description BLOOD RIGHT ARM  Final   Special Requests   Final    BOTTLES DRAWN AEROBIC AND ANAEROBIC Blood Culture results may not be optimal due to an inadequate volume of blood received in culture bottles   Culture   Final    NO GROWTH 5 DAYS Performed at [facility], [address], [place], [place] [address]    Report Status 07/06/2019 FINAL  Final  Blood Culture (routine x 2)     Status: None   Collection Time: 07/01/19  3:15 PM   Specimen: BLOOD LEFT ARM  Result Value Ref Range Status   Specimen Description BLOOD LEFT ARM  Final   Special Requests   Final    BOTTLES DRAWN AEROBIC AND ANAEROBIC Blood Culture results may not be optimal due to an inadequate volume of blood received in culture bottles   Culture   Final    NO GROWTH 5 DAYS Performed at [facility], [address] [place], [place] [address]    Report Status 07/06/2019 FINAL  Final  SARS CORONAVIRUS 2 (TAT 6-24 HRS) Nasopharyngeal Nasopharyngeal Swab     Status: None   Collection Time: 07/01/19  4:21 PM   Specimen: Nasopharyngeal Swab  Result Value Ref Range Status   SARS Coronavirus 2 NEGATIVE NEGATIVE Final    Comment: (NOTE) SARS-CoV-2 target nucleic acids are NOT DETECTED. The SARS-CoV-2 RNA is generally detectable in upper and lower respiratory specimens during the acute phase of infection. Negative results do not preclude  SARS-CoV-2 infection, do not rule out co-infections with other pathogens, and should not be used as the sole basis for treatment or other patient management decisions. Negative results must be combined with clinical observations, patient history, and epidemiological information. The expected result is Negative. Fact Sheet for Patients: SugarRoll.be Fact Sheet for  Healthcare Providers: https://www.woods-mathews.com/ This test is not yet approved or cleared by the [place] and  has been authorized for detection and/or diagnosis of SARS-CoV-2 by FDA under an Emergency Use Authorization (EUA). This EUA will remain  in effect (meaning this test can be used) for the duration of the COVID-19 declaration under Section 56 4(b)(1) of the Act, 21 U.S.C. section 360bbb-3(b)(1), unless the authorization is terminated or revoked sooner. Performed at [facility], [place] [address], [place], [place] [address]   Urine culture     Status: None   Collection Time: 07/01/19 10:15 PM   Specimen: In/Out Cath Urine  Result Value Ref Range Status   Specimen Description   Final    IN/OUT CATH URINE Performed at [facility], [address], [place], [place] [address]    Special Requests   Final    NONE Performed at [facility], [address], [place], [place] [address]    Culture   Final    NO GROWTH Performed at [facility], [place] [address], [place], [place] [address]    Report Status 07/03/2019 FINAL  Final  MRSA PCR Screening     Status: None   Collection Time: 07/02/19  7:51 PM   Specimen: Nasal Mucosa; Nasopharyngeal  Result Value Ref Range Status   MRSA by PCR NEGATIVE NEGATIVE Final    Comment:        The GeneXpert MRSA Assay (FDA approved for NASAL specimens only), is one component of a comprehensive MRSA colonization surveillance program. It is not intended to diagnose MRSA infection nor to guide or monitor treatment  for MRSA infections. Performed at [facility], [address] [place], [place] [address]     Radiology Reports CT ANGIO HEAD W OR WO CONTRAST  Result Date: 07/02/2019 CLINICAL DATA:  Initial evaluation for acute altered mental status. EXAM: CT ANGIOGRAPHY HEAD AND NECK CT PERFUSION BRAIN TECHNIQUE: Multidetector CT imaging of the head and neck was performed using the standard protocol during bolus administration of intravenous contrast. Multiplanar CT image reconstructions and MIPs were obtained to evaluate the vascular anatomy. Carotid stenosis measurements (when applicable) are obtained utilizing NASCET criteria, using the distal internal carotid diameter as the denominator. Multiphase CT imaging of the brain was performed following IV bolus contrast injection. Subsequent parametric perfusion maps were calculated using RAPID software. CONTRAST:  121m OMNIPAQUE IOHEXOL 350 MG/ML SOLN COMPARISON:  Prior head CT from earlier same day. FINDINGS: CTA NECK FINDINGS Aortic arch: Examination technically limited by timing of the contrast bolus, with poor opacification of the arterial system. Visualized aortic arch of normal caliber with normal branch pattern. Moderate atherosclerotic change about the arch and origin of the great vessels without hemodynamically significant stenosis. Visualized subclavian arteries widely patent. Right carotid system: Right CCA tortuous proximally but is widely patent to the bifurcation without flow-limiting stenosis. Extensive bulky calcified plaque about the right bifurcation/proximal right ICA with resultant severe stenosis of at least 75% by NASCET criteria. Right ICA otherwise patent distally to the skull base without stenosis, dissection, or occlusion. Left carotid system: Left CCA mildly tortuous but patent to the bifurcation without flow-limiting stenosis. Extensive bulky plaque about the left bifurcation/proximal left ICA with resultant severe stenosis of up to left ICA  otherwise patent to the skull base without stenosis, dissection or occlusion. Approximately 80% by NASCET criteria. Vertebral arteries: Both vertebral arteries arise from the subclavian arteries. Atheromatous plaque at the origin of the vertebral arteries bilaterally with estimated  50-75% ostial stenosis bilaterally, left greater than right. Left vertebral artery slightly dominant. Vertebral arteries otherwise patent within the neck without stenosis, dissection, or occlusion. Skeleton: No acute osseous abnormality. No discrete or worrisome osseous lesions. Exaggeration of the normal thoracic kyphosis noted. Degenerative changes noted about the left TMJ. Other neck: No other acute soft tissue abnormality within the neck. No mass lesion or adenopathy. Upper chest: Mild scattered atelectatic changes noted within the visualized lungs. Underlying emphysema. Cardiomegaly with scattered coronary artery calcifications partially visualized. Visualized upper chest demonstrates no other acute finding. Review of the MIP images confirms the above findings CTA HEAD FINDINGS Anterior circulation: Examination of the intracranial circulation technically limited by timing of the contrast bolus. Petrous segments patent bilaterally. Extensive calcified plaque throughout the carotid siphons with associated moderate to severe diffuse narrowing. ICA termini well perfused. A1 segments patent bilaterally. Normal anterior communicating artery. Anterior cerebral arteries patent to their distal aspects without stenosis. No M1 stenosis or occlusion. Normal MCA bifurcations. Distal MCA branches well perfused and symmetric. Posterior circulation: Mild nonstenotic plaque noted within the proximal left V4 segment without significant stenosis. Left vertebral otherwise widely patent to the vertebrobasilar junction. Focal plaque within the proximal right V4 segment with short-segment mild to moderate stenosis. Left PICA patent. Right PICA not well  seen. Basilar patent to its distal aspect without stenosis. Superior cerebral arteries patent bilaterally. Both PCAs primarily supplied via the basilar. PCAs well perfused to their distal aspects without stenosis. Venous sinuses: Not well assessed due to timing of the contrast bolus. Anatomic variants: None significant. Review of the MIP images confirms the above findings CT Brain Perfusion Findings: ASPECTS: 10 CT perfusion portion of this exam failed due to poor timing of the contrast bolus. Study is nondiagnostic for evaluation of acute ischemia or perfusion abnormality. IMPRESSION: CTA HEAD AND NECK IMPRESSION: 1. Technically limited exam due to timing of the contrast bolus. 2. Negative CTA for emergent large vessel occlusion. 3. Heavy atheromatous plaque about the carotid bifurcations with associated stenoses of up to at least 75-80% by NASCET criteria bilaterally, left slightly worse than right. 4. Atheromatous plaque at the origins of both vertebral arteries with estimated moderate to severe 50-75% stenoses bilaterally. Left vertebral artery dominant. 5. Extensive calcified plaque throughout the carotid siphons with associated moderate to severe diffuse narrowing. 6. Aortic Atherosclerosis (ICD10-I70.0) and Emphysema (ICD10-J43.9). CT PERFUSION IMPRESSION: CT perfusion portion of this exam failed due to poor timing of the contrast bolus. Examination is nondiagnostic for evaluation of possible ischemia or other perfusion abnormality. Electronically Signed   By: Jeannine Boga M.D.   On: 07/02/2019 20:27   DG Chest 2 View  Result Date: 07/03/2019 CLINICAL DATA:  Dyspnea EXAM: CHEST - 2 VIEW COMPARISON:  07/01/2019 FINDINGS: Frontal and lateral views of the chest demonstrate a stable cardiac silhouette. There is persistent central vascular congestion. Chronic background interstitial scarring. No airspace disease, effusion, or pneumothorax. No acute bony abnormalities. IMPRESSION: 1. Chronic central  vascular congestion. 2. Chronic interstitial lung disease. 3. No acute airspace disease. Electronically Signed   By: Randa Ngo M.D.   On: 07/03/2019 12:20   CT ANGIO NECK W OR WO CONTRAST  Result Date: 07/02/2019 CLINICAL DATA:  Initial evaluation for acute altered mental status. EXAM: CT ANGIOGRAPHY HEAD AND NECK CT PERFUSION BRAIN TECHNIQUE: Multidetector CT imaging of the head and neck was performed using the standard protocol during bolus administration of intravenous contrast. Multiplanar CT image reconstructions and MIPs were obtained to evaluate the vascular  anatomy. Carotid stenosis measurements (when applicable) are obtained utilizing NASCET criteria, using the distal internal carotid diameter as the denominator. Multiphase CT imaging of the brain was performed following IV bolus contrast injection. Subsequent parametric perfusion maps were calculated using RAPID software. CONTRAST:  174m OMNIPAQUE IOHEXOL 350 MG/ML SOLN COMPARISON:  Prior head CT from earlier same day. FINDINGS: CTA NECK FINDINGS Aortic arch: Examination technically limited by timing of the contrast bolus, with poor opacification of the arterial system. Visualized aortic arch of normal caliber with normal branch pattern. Moderate atherosclerotic change about the arch and origin of the great vessels without hemodynamically significant stenosis. Visualized subclavian arteries widely patent. Right carotid system: Right CCA tortuous proximally but is widely patent to the bifurcation without flow-limiting stenosis. Extensive bulky calcified plaque about the right bifurcation/proximal right ICA with resultant severe stenosis of at least 75% by NASCET criteria. Right ICA otherwise patent distally to the skull base without stenosis, dissection, or occlusion. Left carotid system: Left CCA mildly tortuous but patent to the bifurcation without flow-limiting stenosis. Extensive bulky plaque about the left bifurcation/proximal left ICA with  resultant severe stenosis of up to left ICA otherwise patent to the skull base without stenosis, dissection or occlusion. Approximately 80% by NASCET criteria. Vertebral arteries: Both vertebral arteries arise from the subclavian arteries. Atheromatous plaque at the origin of the vertebral arteries bilaterally with estimated 50-75% ostial stenosis bilaterally, left greater than right. Left vertebral artery slightly dominant. Vertebral arteries otherwise patent within the neck without stenosis, dissection, or occlusion. Skeleton: No acute osseous abnormality. No discrete or worrisome osseous lesions. Exaggeration of the normal thoracic kyphosis noted. Degenerative changes noted about the left TMJ. Other neck: No other acute soft tissue abnormality within the neck. No mass lesion or adenopathy. Upper chest: Mild scattered atelectatic changes noted within the visualized lungs. Underlying emphysema. Cardiomegaly with scattered coronary artery calcifications partially visualized. Visualized upper chest demonstrates no other acute finding. Review of the MIP images confirms the above findings CTA HEAD FINDINGS Anterior circulation: Examination of the intracranial circulation technically limited by timing of the contrast bolus. Petrous segments patent bilaterally. Extensive calcified plaque throughout the carotid siphons with associated moderate to severe diffuse narrowing. ICA termini well perfused. A1 segments patent bilaterally. Normal anterior communicating artery. Anterior cerebral arteries patent to their distal aspects without stenosis. No M1 stenosis or occlusion. Normal MCA bifurcations. Distal MCA branches well perfused and symmetric. Posterior circulation: Mild nonstenotic plaque noted within the proximal left V4 segment without significant stenosis. Left vertebral otherwise widely patent to the vertebrobasilar junction. Focal plaque within the proximal right V4 segment with short-segment mild to moderate  stenosis. Left PICA patent. Right PICA not well seen. Basilar patent to its distal aspect without stenosis. Superior cerebral arteries patent bilaterally. Both PCAs primarily supplied via the basilar. PCAs well perfused to their distal aspects without stenosis. Venous sinuses: Not well assessed due to timing of the contrast bolus. Anatomic variants: None significant. Review of the MIP images confirms the above findings CT Brain Perfusion Findings: ASPECTS: 10 CT perfusion portion of this exam failed due to poor timing of the contrast bolus. Study is nondiagnostic for evaluation of acute ischemia or perfusion abnormality. IMPRESSION: CTA HEAD AND NECK IMPRESSION: 1. Technically limited exam due to timing of the contrast bolus. 2. Negative CTA for emergent large vessel occlusion. 3. Heavy atheromatous plaque about the carotid bifurcations with associated stenoses of up to at least 75-80% by NASCET criteria bilaterally, left slightly worse than right. 4. Atheromatous plaque  at the origins of both vertebral arteries with estimated moderate to severe 50-75% stenoses bilaterally. Left vertebral artery dominant. 5. Extensive calcified plaque throughout the carotid siphons with associated moderate to severe diffuse narrowing. 6. Aortic Atherosclerosis (ICD10-I70.0) and Emphysema (ICD10-J43.9). CT PERFUSION IMPRESSION: CT perfusion portion of this exam failed due to poor timing of the contrast bolus. Examination is nondiagnostic for evaluation of possible ischemia or other perfusion abnormality. Electronically Signed   By: Jeannine Boga M.D.   On: 07/02/2019 20:27   MR BRAIN WO CONTRAST  Result Date: 07/03/2019 CLINICAL DATA:  86 year old male code stroke presentation yesterday. Negative large vessel occlusion on CTA. EXAM: MRI HEAD WITHOUT CONTRAST TECHNIQUE: Multiplanar, multiecho pulse sequences of the brain and surrounding structures were obtained without intravenous contrast. COMPARISON:  CT head and CTA head  and neck yesterday. FINDINGS: Brain: No restricted diffusion or evidence of acute infarction. Chronic lacunar type infarct of the left corona radiata and extending to the left deep gray nuclei. Occasional superimposed small areas of chronic cortical encephalomalacia such as the right superior frontal gyrus on series 8, image 42, pre motor area. Scattered other white matter T2 and FLAIR hyperintensity, although generally mild for age. There are occasional chronic micro hemorrhages suspected (left periatrial white matter series 9, image 12). Brainstem and cerebellum appear largely negative. Cavum septum pellucidum, normal variant. No midline shift, mass effect, evidence of mass lesion, ventriculomegaly, extra-axial collection or acute intracranial hemorrhage. Cervicomedullary junction and pituitary are within normal limits. Vascular: Major intracranial vascular flow voids is are preserved. Skull and upper cervical spine: Degenerative ligamentous hypertrophy about the odontoid. Visualized bone marrow signal is within normal limits. Sinuses/Orbits: Postoperative changes to both globes, otherwise negative orbits. Paranasal sinus mucosal thickening is stable and most pronounced in the left maxillary sinus. Other: Mastoid air cells are well pneumatized. Limited detail of the internal auditory structures. Scalp and face soft tissues appear grossly negative. IMPRESSION: 1. No acute intracranial abnormality. 2. Chronic small vessel ischemia, most pronounced in the left corona radiata. Electronically Signed   By: Genevie Ann M.D.   On: 07/03/2019 18:17   CT CEREBRAL PERFUSION W CONTRAST  Result Date: 07/02/2019 CLINICAL DATA:  Initial evaluation for acute altered mental status. EXAM: CT ANGIOGRAPHY HEAD AND NECK CT PERFUSION BRAIN TECHNIQUE: Multidetector CT imaging of the head and neck was performed using the standard protocol during bolus administration of intravenous contrast. Multiplanar CT image reconstructions and MIPs  were obtained to evaluate the vascular anatomy. Carotid stenosis measurements (when applicable) are obtained utilizing NASCET criteria, using the distal internal carotid diameter as the denominator. Multiphase CT imaging of the brain was performed following IV bolus contrast injection. Subsequent parametric perfusion maps were calculated using RAPID software. CONTRAST:  174m OMNIPAQUE IOHEXOL 350 MG/ML SOLN COMPARISON:  Prior head CT from earlier same day. FINDINGS: CTA NECK FINDINGS Aortic arch: Examination technically limited by timing of the contrast bolus, with poor opacification of the arterial system. Visualized aortic arch of normal caliber with normal branch pattern. Moderate atherosclerotic change about the arch and origin of the great vessels without hemodynamically significant stenosis. Visualized subclavian arteries widely patent. Right carotid system: Right CCA tortuous proximally but is widely patent to the bifurcation without flow-limiting stenosis. Extensive bulky calcified plaque about the right bifurcation/proximal right ICA with resultant severe stenosis of at least 75% by NASCET criteria. Right ICA otherwise patent distally to the skull base without stenosis, dissection, or occlusion. Left carotid system: Left CCA mildly tortuous but patent to the bifurcation  without flow-limiting stenosis. Extensive bulky plaque about the left bifurcation/proximal left ICA with resultant severe stenosis of up to left ICA otherwise patent to the skull base without stenosis, dissection or occlusion. Approximately 80% by NASCET criteria. Vertebral arteries: Both vertebral arteries arise from the subclavian arteries. Atheromatous plaque at the origin of the vertebral arteries bilaterally with estimated 50-75% ostial stenosis bilaterally, left greater than right. Left vertebral artery slightly dominant. Vertebral arteries otherwise patent within the neck without stenosis, dissection, or occlusion. Skeleton: No acute  osseous abnormality. No discrete or worrisome osseous lesions. Exaggeration of the normal thoracic kyphosis noted. Degenerative changes noted about the left TMJ. Other neck: No other acute soft tissue abnormality within the neck. No mass lesion or adenopathy. Upper chest: Mild scattered atelectatic changes noted within the visualized lungs. Underlying emphysema. Cardiomegaly with scattered coronary artery calcifications partially visualized. Visualized upper chest demonstrates no other acute finding. Review of the MIP images confirms the above findings CTA HEAD FINDINGS Anterior circulation: Examination of the intracranial circulation technically limited by timing of the contrast bolus. Petrous segments patent bilaterally. Extensive calcified plaque throughout the carotid siphons with associated moderate to severe diffuse narrowing. ICA termini well perfused. A1 segments patent bilaterally. Normal anterior communicating artery. Anterior cerebral arteries patent to their distal aspects without stenosis. No M1 stenosis or occlusion. Normal MCA bifurcations. Distal MCA branches well perfused and symmetric. Posterior circulation: Mild nonstenotic plaque noted within the proximal left V4 segment without significant stenosis. Left vertebral otherwise widely patent to the vertebrobasilar junction. Focal plaque within the proximal right V4 segment with short-segment mild to moderate stenosis. Left PICA patent. Right PICA not well seen. Basilar patent to its distal aspect without stenosis. Superior cerebral arteries patent bilaterally. Both PCAs primarily supplied via the basilar. PCAs well perfused to their distal aspects without stenosis. Venous sinuses: Not well assessed due to timing of the contrast bolus. Anatomic variants: None significant. Review of the MIP images confirms the above findings CT Brain Perfusion Findings: ASPECTS: 10 CT perfusion portion of this exam failed due to poor timing of the contrast bolus.  Study is nondiagnostic for evaluation of acute ischemia or perfusion abnormality. IMPRESSION: CTA HEAD AND NECK IMPRESSION: 1. Technically limited exam due to timing of the contrast bolus. 2. Negative CTA for emergent large vessel occlusion. 3. Heavy atheromatous plaque about the carotid bifurcations with associated stenoses of up to at least 75-80% by NASCET criteria bilaterally, left slightly worse than right. 4. Atheromatous plaque at the origins of both vertebral arteries with estimated moderate to severe 50-75% stenoses bilaterally. Left vertebral artery dominant. 5. Extensive calcified plaque throughout the carotid siphons with associated moderate to severe diffuse narrowing. 6. Aortic Atherosclerosis (ICD10-I70.0) and Emphysema (ICD10-J43.9). CT PERFUSION IMPRESSION: CT perfusion portion of this exam failed due to poor timing of the contrast bolus. Examination is nondiagnostic for evaluation of possible ischemia or other perfusion abnormality. Electronically Signed   By: Jeannine Boga M.D.   On: 07/02/2019 20:27   DG Chest Port 1 View  Result Date: 07/01/2019 CLINICAL DATA:  Shortness of breath. EXAM: PORTABLE CHEST 1 VIEW COMPARISON:  December 26, 2018. FINDINGS: Stable cardiomediastinal silhouette. No pneumothorax or pleural effusion is noted. Both lungs are clear. The visualized skeletal structures are unremarkable. IMPRESSION: No active disease. Electronically Signed   By: Marijo Conception M.D.   On: 07/01/2019 15:15   EEG adult  Result Date: 07/03/2019 Lora Havens, MD     07/03/2019  6:59 PM Patient Name: Scott [name] MRN: [id number] Epilepsy Attending: Lora Havens Referring Physician/Provider: Dr Roxan Hockey Date: 07/03/2019 Duration: 24.14 mins Patient history: 86 year old male with transient alteration of awareness.  EEG evaluate for seizures. Level of alertness: Awake, asleep AEDs during EEG study: Gabapentin Technical aspects: This EEG study was done with scalp electrodes  positioned according to the 10-20 International system of electrode placement. Electrical activity was acquired at a sampling rate of 500Hz and reviewed with a high frequency filter of 70Hz and a low frequency filter of 1Hz. EEG data were recorded continuously and digitally stored. Description: No clear posterior dominant rhythm was seen. Sleep was characterized by vertex waves, maximal frontocentral region.  EEG showed continuous generalized 6-8 Hz theta-alpha activity.     Hyperventilation and photic stimulation were not performed.  Abnormality - Continuous slow, generalized IMPRESSION: This study is suggestive of mild to moderate diffuse encephalopathy. No seizures or epileptiform discharges were seen throughout the recording. Lora Havens   ECHOCARDIOGRAM COMPLETE  Result Date: 07/02/2019    ECHOCARDIOGRAM REPORT   Patient Name:   Scott [name] Date of Exam: 07/02/2019 Medical Rec #:  [id number]     Height:       65.0 in Accession #:    [id number]    Weight:       147.9 lb Date of Birth:  [birth year]      BSA:          1.740 m Patient Age:    89 years      BP:           130/56 mmHg Patient Gender: M             HR:           40 bpm. Exam Location:  [facility] Procedure: 2D Echo Indications:    CHF-Acute Systolic 417.40 / C14.48  History:        Patient has prior history of Echocardiogram examinations, most                 recent 12/11/2018. CAD and Previous Myocardial Infarction,                 Arrythmias:Atrial Fibrillation, Signs/Symptoms:Dyspnea; Risk                 Factors:Dyslipidemia, Hypertension and Former Smoker. NICM ,                 Bradycardia.  Sonographer:    Leavy Cella RDCS (AE) Referring Phys: [place]  1. Left ventricular ejection fraction, by estimation, is 35 to 40%. The left ventricle has moderately decreased function. The left ventricle demonstrates regional wall motion abnormalities (see scoring diagram/findings for description). Left ventricular   diastolic parameters are indeterminate.  2. Right ventricular systolic function is moderately reduced. The right ventricular size is normal. There is moderately elevated pulmonary artery systolic pressure. The estimated right ventricular systolic pressure is 18.5 mmHg.  3. Left atrial size was mildly dilated.  4. The mitral valve is degenerative. Mild mitral valve regurgitation.  5. The aortic valve is tricuspid, moderately calcified with restricted motion of the left coronary cusp. Aortic valve regurgitation is not visualized.  6. The inferior vena cava is normal in size with greater than 50% respiratory variability, suggesting right atrial pressure of 3 mmHg. FINDINGS  Left Ventricle: Left ventricular ejection fraction, by estimation, is 35 to 40%. The left ventricle has moderately decreased function. The left ventricle demonstrates regional wall motion abnormalities.  The left ventricular internal cavity size was normal in size. There is borderline left ventricular hypertrophy. Left ventricular diastolic parameters are indeterminate.  LV Wall Scoring: The basal inferior segment is akinetic. The mid inferior segment is hypokinetic. Right Ventricle: The right ventricular size is normal. No increase in right ventricular wall thickness. Right ventricular systolic function is moderately reduced. There is moderately elevated pulmonary artery systolic pressure. The tricuspid regurgitant velocity is 3.66 m/s, and with an assumed right atrial pressure of 3 mmHg, the estimated right ventricular systolic pressure is 02.4 mmHg. Left Atrium: Left atrial size was mildly dilated. Right Atrium: Right atrial size was normal in size. Pericardium: There is no evidence of pericardial effusion. Mitral Valve: The mitral valve is degenerative in appearance. Moderate mitral annular calcification. Mild mitral valve regurgitation. Tricuspid Valve: The tricuspid valve is grossly normal. Tricuspid valve regurgitation is mild. Aortic Valve:  The aortic valve is tricuspid. Aortic valve regurgitation is not visualized. Moderate aortic valve annular calcification. There is moderate calcification of the aortic valve. Pulmonic Valve: The pulmonic valve was not well visualized. Pulmonic valve regurgitation is not visualized. Aorta: The aortic root is normal in size and structure. Venous: The inferior vena cava is normal in size with greater than 50% respiratory variability, suggesting right atrial pressure of 3 mmHg. IAS/Shunts: No atrial level shunt detected by color flow Doppler.  LEFT VENTRICLE PLAX 2D LVIDd:         5.14 cm  Diastology LVIDs:         3.31 cm  LV e' lateral:   5.11 cm/s LV PW:         1.08 cm  LV E/e' lateral: 18.5 LV IVS:        1.06 cm  LV e' medial:    3.15 cm/s LVOT diam:     2.20 cm  LV E/e' medial:  29.9 LVOT Area:     3.80 cm  RIGHT VENTRICLE RV S prime:     8.05 cm/s TAPSE (M-mode): 1.2 cm LEFT ATRIUM             Index LA diam:        3.60 cm 2.07 cm/m LA Vol (A2C):   61.0 ml 35.05 ml/m LA Vol (A4C):   56.7 ml 32.58 ml/m LA Biplane Vol: 60.6 ml 34.82 ml/m   AORTA Ao Root diam: 3.30 cm MITRAL VALVE                TRICUSPID VALVE MV Area (PHT): 2.55 cm     TR Peak grad:   53.6 mmHg MV Decel Time: 297 msec     TR Vmax:        366.00 cm/s MV E velocity: 94.30 cm/s MV A velocity: 123.00 cm/s  SHUNTS MV E/A ratio:  0.77         Systemic Diam: 2.20 cm Rozann Lesches MD Electronically signed by Rozann Lesches MD Signature Date/Time: 07/02/2019/2:59:42 PM    Final    CT HEAD CODE STROKE WO CONTRAST`  Addendum Date: 07/02/2019   ADDENDUM REPORT: 07/02/2019 18:33 ADDENDUM: Study discussed by telephone with Dr. Roxan Hockey on 07/02/2019 at [place] hours. Electronically Signed   By: Genevie Ann M.D.   On: 07/02/2019 18:33   Result Date: 07/02/2019 CLINICAL DATA:  Code stroke. 86 year old male with sudden change in behavior, now mostly nonresponsive. EXAM: CT HEAD WITHOUT CONTRAST TECHNIQUE: Contiguous axial images were obtained from  the base of the skull through the vertex without intravenous contrast. COMPARISON:  Head CT 12/21/2018. FINDINGS: Brain: Stable cerebral volume. No ventriculomegaly. Cavum septum pellucidum, normal variant. 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 Stable gray-white matter differentiation throughout the brain. No acute intracranial hemorrhage identified. No midline shift, mass effect, or evidence of intracranial mass lesion. No cortically based acute infarct identified. Vascular: Extensive Calcified atherosclerosis at the skull base.6 No suspicious intracranial vascular hyperdensity. [id number] Skull: No acute osseous abnormality identified. Sinuses/Orbits: Stable paranasal sinuses since last year. Mastoids remain clear. Other: No acute orbit or scalp soft tissue finding. ASPECTS [facility] Stroke Program Early CT Score) Total score (0-10 with 10 being normal): 10 IMPRESSION: 1. Stable non contrast CT appearance of the brain since 2020. No acute cortically based infarct or acute intracranial hemorrhage identified. ASPECTS 10. 2. Chronic small vessel ischemia in the left corona radiata and basal ganglia. Electronically Signed: By: Genevie Ann M.D. On: 07/02/2019 18:27     CBC Recent Labs  Lab 07/01/19 1452 07/01/19 1452 07/02/19 0443 07/02/19 1247 07/03/19 0523 07/05/19 0610 07/06/19 0526  WBC 14.3*   < > 12.5* 11.3* 10.9* 9.3 5.5  HGB 13.7   < > 13.0 12.3* 11.7* 12.1* 11.8*  HCT 43.5   < > 41.7 39.5 37.9* 40.2 39.2  PLT 158   < > 131* 120* 119* 153 143*  MCV 87.7   < > 89.5 88.8 89.2 90.1 91.0  MCH 27.6   < > 27.9 27.6 27.5 27.1 27.4  MCHC 31.5   < > 31.2 31.1 30.9 30.1 30.1  RDW 15.0   < > 15.5 15.3 15.7* 15.4 15.5  LYMPHSABS 1.1  --   --  0.8  --   --   --   MONOABS 0.8  --   --  0.8  --   --   --   EOSABS 0.0  --   --  0.0  --   --   --   BASOSABS 0.0  --   --  0.0  --   --   --    < > = values in this interval not displayed.    Chemistries  Recent Labs  Lab  07/01/19 1452 07/01/19 1452 07/02/19 0443 07/02/19 1247 07/03/19 0523 07/05/19 0610 07/06/19 0526  NA 141   < > 147* 144 143 143 144  K 3.9   < > 4.1 4.4 4.3 4.6 4.4  CL 103   < > 108 106 108 108 107  CO2 27   < > _0 GLUCOSE 101*   < > 103* 117* 142* 138* 93  BUN 55*   < > 52* 54* 55* 56* 48*  CREATININE 2.50*   < > 2.22* 2.26* 1.94* 1.37* 1.23  CALCIUM 9.5   < > 9.5 9.1 8.8* 9.2 9.2  MG  --   --  2.4  --   --   --  2.4  AST 24  --   --   --   --   --   --   ALT 16  --   --   --   --   --   --   ALKPHOS 157*  --   --   --   --   --   --   BILITOT 0.9  --   --   --   --   --   --    < > = values in this interval not displayed.  ------------------------------------------------------------------------------------------------------------------ No results for  input(s): CHOL, HDL, LDLCALC, TRIG, CHOLHDL, LDLDIRECT in the last 72 hours.  Lab Results  Component Value Date   HGBA1C 5.8 (H) 11/28/2015   ------------------------------------------------------------------------------------------------------------------ No results for input(s): TSH, T4TOTAL, T3FREE, THYROIDAB in the last 72 hours.  Invalid input(s): FREET3 ------------------------------------------------------------------------------------------------------------------ No results for input(s): VITAMINB12, FOLATE, FERRITIN, TIBC, IRON, RETICCTPCT in the last 72 hours.  Coagulation profile Recent Labs  Lab 07/02/19 0443 07/03/19 0523 07/04/19 0526 07/05/19 0610 07/06/19 0526  INR 2.1* 2.9* 3.9* 3.8* 4.0*    No results for input(s): DDIMER in the last 72 hours.  Cardiac Enzymes No results for input(s): CKMB, TROPONINI, MYOGLOBIN in the last 168 hours.  Invalid input(s): CK ------------------------------------------------------------------------------------------------------------------    Component Value Date/Time   BNP 1,193.0 (H) 07/02/2019 1247    Roxan Hockey M.D on 07/06/2019 at 10:59  AM  Go to [email or web address] - for contact info  [facility] - Office  [phone]

## 2019-07-06 NOTE — Progress Notes (Signed)
ANTICOAGULATION CONSULT NOTE -   Pharmacy Consult for warfarin dosing  Indication: atrial fibrillation               Temp: 97.9 F (36.6 C) (03/26 0444) BP: 138/63 (03/26 0444) Pulse Rate: 74 (03/26 0942)  Labs: Recent Labs    07/04/19 0526 07/05/19 0610 07/06/19 0526  HGB  --  12.1* 11.8*  HCT  --  40.2 39.2  PLT  --  153 143*  LABPROT 38.2* 37.4* 39.3*  INR 3.9* 3.8* 4.0*  CREATININE  --  1.37* 1.23    Estimated Creatinine Clearance: 31.3 mL/min (by C-G formula based on SCr of 1.23 mg/dL).    Goal of Therapy:  INR 2-3 Monitor platelets by anticoagulation protocol: Yes   Prior to Admission Warfarin Dosing:  [name] Leitzke takes 2.5mg  of warfarin every day of the week except on M and F, takes 5mg       Lab Results  Component Value Date   INR 4.0 (H) 07/06/2019   INR 3.8 (H) 07/05/2019   INR 3.9 (H) 07/04/2019   Assessment: Scott [name] a 86 y.o. male requires anticoagulation with warfarin for the indication of  atrial fibrillation. Warfarin will be initiated inpatient following pharmacy protocol per pharmacy consult. Patient most recent blood work is as follows: CBC Latest Ref Rng & Units 07/06/2019 07/05/2019 07/03/2019  WBC 4.0 - 10.5 K/uL 5.5 9.3 10.9(H)  Hemoglobin 13.0 - 17.0 g/dL 11.8(L) 12.1(L) 11.7(L)  Hematocrit 39.0 - 52.0 % 39.2 40.2 37.9(L)  Platelets 150 - 400 K/uL 143(L) 153 119(L)    07/06/19 INR: 4.0 today CBC low stable  Plates 153>143 Patient hasn't had any po intake (and resulting Vit K intake) for several days  Plan: Continue to hold warfarin for INR 4.0 Monitor CBC MWF with am labs   Monitor INR daily Monitor for signs and symptoms of bleeding   Despina Pole, Pharm. D. Clinical Pharmacist 07/06/2019 10:32 AM

## 2019-07-06 NOTE — Progress Notes (Signed)
Physical Therapy Treatment Patient Details Name: Scott [name] MRN: [id number] DOB: 08-30-1921 Today's Date: 07/06/2019    History of Present Illness Scott [name] is a 86 y.o. male with medical history significant of DNR/DNI, hx of a fib on coumadin, CAD s/p MI 2019, left BKA, diabetes, presented to the hospital with fever, lethargy, and shortness of breath.  His caretaker at the bedside reports that for the past 3 days or so the patient has been complaining about shortness of breath.  They have noted that his home O2 sat has been dropping as low as 72% on room air.  Normally the patient is 93 to 94% on room air.  They feel like he was improving with albuterol treatments at home, but today he maintained sats in the 80s.  Patient never complained about chest pain.  They noted that he might be febrile today.  Patient himself appears extremely tired on exam and cannot provide me any other history.    PT Comments    Patient presents very lethargic, mostly non-verbal and able to open eyes for a few seconds when prompted.  Patient demonstrates fair/good sitting balance while seated at bedside, required active assistance to complete exercises and unable to attempt sit to stands due to weakness/lethargy.  Patient required Max/total assistance to reposition when put back to bed.  Patient will benefit from continued physical therapy in hospital and recommended venue below to increase strength, balance, endurance for safe ADLs and gait.    Follow Up Recommendations  SNF;Supervision for mobility/OOB;Supervision/Assistance - 24 hour     Equipment Recommendations  None recommended by PT    Recommendations for Other Services       Precautions / Restrictions Precautions Precautions: Fall Restrictions Weight Bearing Restrictions: No    Mobility  Bed Mobility Overal bed mobility: Needs Assistance Bed Mobility: Supine to Sit;Sit to Supine     Supine to sit: Max assist Sit to supine: Max assist    General bed mobility comments: slow labored movement  Transfers                    Ambulation/Gait                 Stairs             Wheelchair Mobility    Modified Rankin (Stroke Patients Only)       Balance Overall balance assessment: Needs assistance Sitting-balance support: Feet supported;No upper extremity supported Sitting balance-Leahy Scale: Fair Sitting balance - Comments: seated at EOB                                    Cognition Arousal/Alertness: Lethargic Behavior During Therapy: Flat affect Overall Cognitive Status: Impaired/Different from baseline Area of Impairment: Attention;Following commands                       Following Commands: Follows one step commands with increased time;Follows multi-step commands inconsistently       General Comments: Very lethargic and mostly non-verbal throughout visit      Exercises General Exercises - Upper Extremity Shoulder Flexion: Seated;AAROM;Strengthening;Both;10 reps General Exercises - Lower Extremity Ankle Circles/Pumps: Seated;AROM;Strengthening;Right;10 reps Long Arc Quad: Seated;AAROM;Strengthening;Right;10 reps Hip Flexion/Marching: Seated;AAROM;Strengthening;Right;10 reps    General Comments        Pertinent Vitals/Pain Pain Assessment: No/denies pain    Home Living  Prior Function            PT Goals (current goals can now be found in the care plan section) Acute Rehab PT Goals Patient Stated Goal: return home PT Goal Formulation: With patient Time For Goal Achievement: 07/18/19 Potential to Achieve Goals: Good Progress towards PT goals: Progressing toward goals    Frequency    Min 3X/week      PT Plan Current plan remains appropriate    Co-evaluation              AM-PAC PT "6 Clicks" Mobility   Outcome Measure  Help needed turning from your back to your side while in a flat bed without  using bedrails?: A Lot Help needed moving from lying on your back to sitting on the side of a flat bed without using bedrails?: A Lot Help needed moving to and from a bed to a chair (including a wheelchair)?: Total Help needed standing up from a chair using your arms (e.g., wheelchair or bedside chair)?: Total Help needed to walk in hospital room?: Total Help needed climbing 3-5 steps with a railing? : Total 6 Click Score: 8    End of Session   Activity Tolerance: Patient limited by lethargy;Patient limited by fatigue Patient left: in bed;with call bell/phone within reach;with bed alarm set Nurse Communication: Mobility status PT Visit Diagnosis: Unsteadiness on feet (R26.81);Muscle weakness (generalized) (M62.81);History of falling (Z91.81);Difficulty in walking, not elsewhere classified (R26.2)     Time: [id number] PT Time Calculation (min) (ACUTE ONLY): 24 min  Charges:  $Therapeutic Exercise: 8-22 mins $Therapeutic Activity: 8-22 mins                     3:26 PM, 07/06/19 Lonell Grandchild, MPT Physical Therapist with [facility] [phone] office [phone] mobile phone

## 2019-07-06 NOTE — Progress Notes (Addendum)
[name], MD     [email or web address]          Scott [name] is an 86 y.o. male.   ASSESSMENT/PLAN: 1.  Multifactorial encephalopathy including medication effect from ceftriaxone which has been discontinued.  Encephalopathy also relates to acute sepsis and acute medical illness.  This will likely take some time to improve.  This is explained that at length to the son. 2.  Community-acquired pneumonia ??? 3.  Remote left basal ganglia infarct 4.  Atrial fibrillation 5.  Cognitive impairment    The patient's son is at the bedside.  The son believes he is only modestly improved.  Still has been drowsy throughout the day.  Repeat EEG shows slowing but no epileptiform activity.  GENERAL: This is a thin somewhat frail-appearing male in no acute distress.  Slight wheezings are noted but he is not dyspneic or tachypneic.  He is on 2 L oxygen.  HEENT:  Neck is suppl, no trauma appreciated.  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  BACK: Normal  SKIN: Normal by inspection.    MENTAL STATUS: He lays in bed with eyes closed.  He does open his eyes to he lays in bed verbal commands and spontaneously.  He follows commands well.    CRANIAL NERVES: The left eye is somewhat clouded with nonreactive pupil.  The right is mid position approximately 5 mm and reactive.  Extraocular movements are intact.  There is no significant nystagmus; visual fields are full; upper and lower facial muscles are normal in strength and symmetric, there is no flattening of the nasolabial folds; tongue is midline; uvula is midline; shoulder elevation is normal.  MOTOR: He has about 4/strength in the upper extremities.  Bulk and tone are normal.  He has antigravity strength in the legs.  COORDINATION: Left finger to nose is normal, right finger to nose is normal, No rest tremor; no intention tremor; no postural tremor; no  bradykinesia.  SENSATION: Normal to pain.    Blood pressure 135/65, pulse 66, temperature 97.8 F (36.6 C), temperature source Oral, resp. rate 20, height 5\' 5"  (1.651 m), weight 72 kg, SpO2 100 %.  Past Medical History:  Diagnosis Date  . Arthritis    "all over"  . Atrial fibrillation ([place])   . Benign localized prostatic hyperplasia with lower urinary tract symptoms (LUTS)   . Bradycardia 11/28/2015   Severe-HR in the 20s to 30s following intubation for urological procedure.  [place] Kitchen CAD- non obstructive disease by cath 3/14 cardiologist-  dr hochrein  . Chronic lower back pain   . COPD (chronic obstructive pulmonary disease) ([place])   . DDD (degenerative disc disease)   . Depression   . Dyspnea    w/ exertion and lying down (raise head)  . ETOH abuse   . GERD (gastroesophageal reflux disease)   . Glaucoma   . Glaucoma, both eyes   . History of bladder cancer urologist-- dr Jeffie Pollock   first dx 2012--  s/p TURBT's  . History of DVT of lower extremity 03/2013   bilateral   . History of pulmonary embolus (PE) 04/2013  . HTN (hypertension)   . Hyperlipidemia   . Hypertension   . LBBB (left bundle branch block)    chronic  . Non-ischemic cardiomyopathy ([place])    last echo 01-18-2017, ef 35-40%  . NSVT (nonsustained ventricular tachycardia) (H[facility])    a. NSVT 06/2012; NSVT also seen during 03/2013 adm. b. Med  rx. Not candidate for ICD given adv age.  Marland Kitchen PAD (peripheral artery disease) ([place])   . 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 PVCs (premature ventricular contractions)   . PVD (peripheral vascular disease) ([facility])    Right ABI .75, Left .78 (2006)  . Syncope 06/2012   a. Felt to be postural syncope related to diuretics 06/2012.  [place] Systolic and diastolic CHF, chronic (HCC) cardiologsit-  dr hochrein   a. NICM - patent cors 06/2012, EF 40% at that time. b. 03/2013 eval: EF 20-25%.  . Vision  loss, left eye    due to glaucoma  . Wears glasses     Past Surgical History:  Procedure Laterality Date  . AMPUTATION Left 10/16/2014   Procedure: AMPUTATION ABOVE KNEE- LEFT;  Surgeon: Mal Misty, MD;  Location: [facility];  Service: Vascular;  Laterality: Left;  . CARDIAC CATHETERIZATION  05-11-2004  DR [facility]   MINIMAL CORANARY PLAQUE/ NORMAL LVF/ EF 55%/  NON-OBSTRUCTIVE LAD 25%  . CARDIAC CATHETERIZATION  06/18/2012   dr hochrein   mild luminal irregularities of coronaries, ef 45-50%  . CARDIOVASCULAR STRESS TEST  10-15-2010   LOW RISK NUCLEAR STUDY/ NO EVIDENCE OF ISCHEMIA/ NORMAL EF  . CATARACT EXTRACTION W/ INTRAOCULAR LENS  IMPLANT, BILATERAL Bilateral   . CYSTOSCOPY  10/07/2011   Procedure: CYSTOSCOPY;  Surgeon: Malka So, MD;  Location: [facility];  Service: Urology;  Laterality: N/A;  . CYSTOSCOPY N/A 10/20/2018   Procedure: CYSTOSCOPY WITH FULGURATION OF PROSTATIC URETHRAL TUMOR;  Surgeon: [name], MD;  Location: [facility];  Service: Urology;  Laterality: N/A;  . CYSTOSCOPY WITH BIOPSY  03/14/2012   Procedure: CYSTOSCOPY WITH BIOPSY;  Surgeon: Malka So, MD;  Location: [facility];  Service: Urology;  Laterality: N/A;  WITH FULGURATION  . CYSTOSCOPY WITH BIOPSY N/A 12/09/2016   Procedure: CYSTOSCOPY WITH BIOPSY AND FULGURATION;  Surgeon: [name], MD;  [facility];  Service: Urology;  Laterality: N/A;  . CYSTOSCOPY WITH BIOPSY N/A 12/20/2017   Procedure: CYSTOSCOPY WITH BIOPSY WITH FULGURATION;  Surgeon: [name], MD;  [facility];  Service: Urology;  Laterality: N/A;  . CYSTOSCOPY WITH BIOPSY N/A 06/09/2018   Procedure: CYSTOSCOPY WITH BIOPSY AND FULGURATION;  Surgeon: [name], MD;  Location: [facility];  Service: Urology;  Laterality: N/A;  . CYSTOSCOPY WITH FULGERATION N/A 01/20/2016   Procedure: CYSTOSCOPY, BIOPSY WITH FULGERATION OF URTHRAL TUMOR;  Surgeon: [name], MD;  [facility];  Service: Urology;  Laterality: N/A;  . CYSTOSCOPY  WITH FULGERATION N/A 06/01/2016   Procedure: CYSTOSCOPY WITH FULGERATION urethral tumors and bladder neck;  Surgeon: [name], MD;  [facility];  Service: Urology;  Laterality: N/A;  . SHOULDER SURGERY Left 1970's  . TONSILLECTOMY    . TRANSTHORACIC ECHOCARDIOGRAM  01-18-2017   dr hochrein   moderate LVH, ef 35-40%, diffuse hypokinesis/  trivial AR and MR/  mild TR  . TRANSURETHRAL RESECTION OF BLADDER TUMOR  10/07/2011   Procedure: TRANSURETHRAL RESECTION OF BLADDER TUMOR (TURBT);  Surgeon: Malka So, MD;  Location[facility];  Service: Urology;  Laterality: N/A;  . TRANSURETHRAL RESECTION OF BLADDER TUMOR N/A 07/10/2015   Procedure: TRANSURETHRAL RESECTION OF BLADDER TUMOR (TURBT), CYSTOSCOPY ;  Surgeon: [name], MD;  [facility];  Service: Urology;  Laterality: N/A;  . TRANSURETHRAL RESECTION OF PROSTATE  03/14/2012   Procedure: TRANSURETHRAL  RESECTION OF THE PROSTATE WITH GYRUS INSTRUMENTS;  Surgeon: Malka So, MD;  [facility];  Service: Urology;  Laterality: N/A;    Family History  Problem Relation Age of Onset  . Hypertension Mother   . Arthritis Mother   . Lung cancer Sister   . Deep vein thrombosis Father   . Early death Brother     Social History:  reports that he quit smoking about 44 years ago. His smoking use included cigarettes. He started smoking about 77 years ago. He has a 35.00 pack-year smoking history. He has never used smokeless tobacco. He reports previous alcohol use of about 7.0 standard drinks of alcohol per week. He reports that he does not use drugs.  Allergies:  Allergies  Allergen Reactions  . Lipitor [Atorvastatin] Other (See Comments)    Unknown rxn per pt    Medications: Prior to Admission medications   Medication Sig Start Date End Date Taking? Authorizing Provider  acetaminophen (TYLENOL) 325 MG tablet Take 2 tablets (650 mg total) by mouth every 6 (six) hours as needed for mild pain (or Fever >/= 101).  12/26/18  Yes Elgergawy, Silver Huguenin, MD  albuterol (PROVENTIL) (2.5 MG/3ML) 0.083% nebulizer solution NEBULIZE 1 VIAL EVERY 6 HOURS AS NEEDED FOR WHEEZING OR SHORTNESS OF BREATH Patient taking differently: Take 2.5 mg by nebulization every 6 (six) hours as needed for wheezing or shortness of breath.  09/07/18  Yes Dettinger, Fransisca Kaufmann, MD  albuterol (VENTOLIN HFA) 108 (90 Base) MCG/ACT inhaler Inhale 2 puffs into the lungs every 6 (six) hours as needed for wheezing or shortness of breath. 05/17/19  Yes Dettinger, Fransisca Kaufmann, MD  aspirin EC 81 MG EC tablet Take 1 tablet (81 mg total) by mouth daily. 12/12/18  Yes Tat, [name], MD  carvedilol (COREG) 6.25 MG tablet TAKE  (1)  TABLET TWICE A DAY. Patient taking differently: Take 6.25 mg by mouth 2 (two) times daily with a meal.  06/22/19  Yes Dettinger, Fransisca Kaufmann, MD  COMBIVENT RESPIMAT 20-100 MCG/ACT AERS respimat 1 PUFF EVERY 4 HOURS AS NEEDED FOR COUGH, WHEEZING, OR SHORTNESS OF BREATH 06/28/19  Yes Dettinger, Fransisca Kaufmann, MD  donepezil (ARICEPT) 10 MG tablet TAKE ONE TABLET AT BEDTIME 04/26/19  Yes Dettinger, Fransisca Kaufmann, MD  febuxostat (ULORIC) 40 MG tablet TAKE 1 TABLET DAILY 04/26/19  Yes Dettinger, Fransisca Kaufmann, MD  fluticasone (FLONASE) 50 MCG/ACT nasal spray Place 1 spray into both nostrils daily as needed for allergies or rhinitis. Patient taking differently: Place 1 spray into both nostrils at bedtime.  09/07/18  Yes Dettinger, Fransisca Kaufmann, MD  folic acid (FOLVITE) 1 MG tablet TAKE 1 TABLET DAILY 05/24/19  Yes Dettinger, Fransisca Kaufmann, MD  furosemide (LASIX) 40 MG tablet TAKE 1 TABLET 2 TIMES A DAY 06/28/19  Yes Dettinger, Fransisca Kaufmann, MD  HYDROcodone-acetaminophen (NORCO/VICODIN) 5-325 MG tablet Take 1 tablet by mouth 2 (two) times daily as needed for moderate pain. 01/30/19  Yes Dettinger, Fransisca Kaufmann, MD  ipratropium (ATROVENT) 0.02 % nebulizer solution Take 0.5 mg by nebulization every 6 (six) hours as needed for wheezing or shortness of breath.   Yes [provider]   MYRBETRIQ 25 MG TB24 tablet Take 25 mg by mouth every morning.  09/20/16  Yes [provider]  pantoprazole (PROTONIX) 40 MG tablet TAKE 1 TABLET DAILY 05/16/19  Yes Dettinger, Fransisca Kaufmann, MD  sertraline (ZOLOFT) 50 MG tablet TAKE 1 TABLET IN THE MORNING 05/24/19  Yes Dettinger, Fransisca Kaufmann, MD  simvastatin ([place])  10 MG tablet TAKE 1 TABLET ONCE DAILY IN THE EVENING Patient taking differently: Take 10 mg by mouth daily at 6 PM.  05/15/19  Yes Dettinger, Fransisca Kaufmann, MD  warfarin (COUMADIN) 2.5 MG tablet Take 2.5 mg by mouth daily except Mondays and Fridays take 5 mg 05/24/19  Yes Dettinger, Fransisca Kaufmann, MD  BIDIL 20-37.5 MG tablet TAKE 1 TABLET BY MOUTH 3 TIMES DAILY. 07/03/19   Dettinger, Fransisca Kaufmann, MD  gabapentin (NEURONTIN) 100 MG capsule TAKE 2 CAPSULES TWICE A DAY 07/03/19   Dettinger, Fransisca Kaufmann, MD    Scheduled Meds: . aspirin EC  81 mg Oral Daily  . carvedilol  6.25 mg Oral BID WC  . donepezil  10 mg Oral QHS  . febuxostat  40 mg Oral Daily  . fluticasone  1 spray Each Nare QHS  . folic acid  1 mg Oral Daily  . gabapentin  300 mg Oral BID  . guaiFENesin  600 mg Oral BID  . isosorbide dinitrate  20 mg Oral TID   And  . hydrALAZINE  37.5 mg Oral TID  . mouth rinse  15 mL Mouth Rinse BID  . methylPREDNISolone (SOLU-MEDROL) injection  60 mg Intravenous Q8H  . mirabegron ER  25 mg Oral q morning - 10a  . pantoprazole  40 mg Oral Daily  . potassium chloride  10 mEq Oral BID  . [START ON 07/07/2019] predniSONE  40 mg Oral Q breakfast  . sertraline  50 mg Oral q morning - 10a  . simvastatin  10 mg Oral q1800  . sodium chloride flush  3 mL Intravenous Q12H   Continuous Infusions: . sodium chloride    . dextrose 5 % and 0.45% NaCl 50 mL/hr at 07/06/19 0300   PRN Meds:.sodium chloride, acetaminophen, HYDROcodone-acetaminophen, ipratropium-albuterol, LORazepam, ondansetron (ZOFRAN) IV, sodium chloride flush     Results for orders placed or performed during the hospital encounter of 07/01/19  (from the past 48 hour(s))  Glucose, capillary     Status: Abnormal   Collection Time: 07/04/19  6:21 PM  Result Value Ref Range   Glucose-Capillary 108 (H) 70 - 99 mg/dL    Comment: Glucose reference range applies only to samples taken after fasting for at least 8 hours.  Glucose, capillary     Status: Abnormal   Collection Time: 07/05/19 12:02 AM  Result Value Ref Range   Glucose-Capillary 118 (H) 70 - 99 mg/dL    Comment: Glucose reference range applies only to samples taken after fasting for at least 8 hours.   Comment 1 Notify RN    Comment 2 Document in Chart   Glucose, capillary     Status: Abnormal   Collection Time: 07/05/19  5:34 AM  Result Value Ref Range   Glucose-Capillary 126 (H) 70 - 99 mg/dL    Comment: Glucose reference range applies only to samples taken after fasting for at least 8 hours.  Protime-INR     Status: Abnormal   Collection Time: 07/05/19  6:10 AM  Result Value Ref Range   Prothrombin Time 37.4 (H) 11.4 - 15.2 seconds   INR 3.8 (H) 0.8 - 1.2    Comment: (NOTE) INR goal varies based on device and disease states. Performed at [facility], [address], [place], [place] [address]   CBC     Status: Abnormal   Collection Time: 07/05/19  6:10 AM  Result Value Ref Range   WBC 9.3 4.0 - 10.5 K/uL   RBC 4.46 4.22 -  5.81 MIL/uL   Hemoglobin 12.1 (L) 13.0 - 17.0 g/dL   HCT 40.2 39.0 - 52.0 %   MCV 90.1 80.0 - 100.0 fL   MCH 27.1 26.0 - 34.0 pg   MCHC 30.1 30.0 - 36.0 g/dL   RDW 15.4 11.5 - 15.5 %   Platelets 153 150 - 400 K/uL   nRBC 0.0 0.0 - 0.2 %    Comment: Performed at [facility], [address] [place], [place] [id number]  Basic metabolic panel     Status: Abnormal   Collection Time: 07/05/19  6:10 AM  Result Value Ref Range   Sodium 143 135 - 145 mmol/L   Potassium 4.6 3.5 - 5.1 mmol/L   Chloride 108 98 - 111 mmol/L   CO2 25 22 - 32 mmol/L   Glucose, Bld 138 (H) 70 - 99 mg/dL    Comment: Glucose reference range applies only to samples  taken after fasting for at least 8 hours.   BUN 56 (H) 8 - 23 mg/dL   Creatinine, Ser 1.37 (H) 0.61 - 1.24 mg/dL   Calcium 9.2 8.9 - 10.3 mg/dL   GFR calc non Af Amer 44 (L) >60 mL/min   GFR calc Af Amer 50 (L) >60 mL/min   Anion gap 10 5 - 15    Comment: Performed at [facility], [address] [place], [place] [address]  Glucose, capillary     Status: Abnormal   Collection Time: 07/05/19 11:20 AM  Result Value Ref Range   Glucose-Capillary 109 (H) 70 - 99 mg/dL    Comment: Glucose reference range applies only to samples taken after fasting for at least 8 hours.  Glucose, capillary     Status: Abnormal   Collection Time: 07/05/19  5:05 PM  Result Value Ref Range   Glucose-Capillary 107 (H) 70 - 99 mg/dL    Comment: Glucose reference range applies only to samples taken after fasting for at least 8 hours.   Comment 1 Notify RN    Comment 2 Document in Chart   Glucose, capillary     Status: None   Collection Time: 07/06/19 12:42 AM  Result Value Ref Range   Glucose-Capillary 99 70 - 99 mg/dL    Comment: Glucose reference range applies only to samples taken after fasting for at least 8 hours.  Protime-INR     Status: Abnormal   Collection Time: 07/06/19  5:26 AM  Result Value Ref Range   Prothrombin Time 39.3 (H) 11.4 - 15.2 seconds   INR 4.0 (H) 0.8 - 1.2    Comment: (NOTE) INR goal varies based on device and disease states. Performed at [facility], [address] [place], [place] [address]   CBC     Status: Abnormal   Collection Time: 07/06/19  5:26 AM  Result Value Ref Range   WBC 5.5 4.0 - 10.5 K/uL   RBC 4.31 4.22 - 5.81 MIL/uL   Hemoglobin 11.8 (L) 13.0 - 17.0 g/dL   HCT 39.2 39.0 - 52.0 %   MCV 91.0 80.0 - 100.0 fL   MCH 27.4 26.0 - 34.0 pg   MCHC 30.1 30.0 - 36.0 g/dL   RDW 15.5 11.5 - 15.5 %   Platelets 143 (L) 150 - 400 K/uL   nRBC 0.0 0.0 - 0.2 %    Comment: Performed at [facility], [address], [place],  [id number]  Basic metabolic panel      Status: Abnormal   Collection Time: 07/06/19  5:26 AM  Result Value Ref Range   Sodium 144 135 - 145 mmol/L   Potassium 4.4 3.5 - 5.1 mmol/L   Chloride 107 98 - 111 mmol/L   CO2 27 22 - 32 mmol/L   Glucose, Bld 93 70 - 99 mg/dL    Comment: Glucose reference range applies only to samples taken after fasting for at least 8 hours.   BUN 48 (H) 8 - 23 mg/dL   Creatinine, Ser 1.23 0.61 - 1.24 mg/dL   Calcium 9.2 8.9 - 10.3 mg/dL   GFR calc non Af Amer 50 (L) >60 mL/min   GFR calc Af Amer 57 (L) >60 mL/min   Anion gap 10 5 - 15    Comment: Performed at [facility], [address] [place], [place] [address]  Magnesium     Status: None   Collection Time: 07/06/19  5:26 AM  Result Value Ref Range   Magnesium 2.4 1.7 - 2.4 mg/dL    Comment: Performed at [facility], [address], [place], [place] [address]  Phosphorus     Status: None   Collection Time: 07/06/19  5:26 AM  Result Value Ref Range   Phosphorus 2.8 2.5 - 4.6 mg/dL    Comment: Performed at [facility], [address], [place], [place] [address]  Glucose, capillary     Status: None   Collection Time: 07/06/19  5:39 AM  Result Value Ref Range   Glucose-Capillary 90 70 - 99 mg/dL    Comment: Glucose reference range applies only to samples taken after fasting for at least 8 hours.  Glucose, capillary     Status: None   Collection Time: 07/06/19  7:41 AM  Result Value Ref Range   Glucose-Capillary 86 70 - 99 mg/dL    Comment: Glucose reference range applies only to samples taken after fasting for at least 8 hours.  Glucose, capillary     Status: Abnormal   Collection Time: 07/06/19 11:10 AM  Result Value Ref Range   Glucose-Capillary 119 (H) 70 - 99 mg/dL    Comment: Glucose reference range applies only to samples taken after fasting for at least 8 hours.   *Note: Due to a large number of results and/or encounters for the requested time period, some results have not been displayed. A complete set of results can be found in  Results Review.    Studies/Results:  EEG Technical aspects: This EEG study was done with scalp electrodes positioned according to the 10-20 International system of electrode placement. Electrical activity was acquired at a sampling rate of 500Hz  and reviewed with a high frequency filter of 70Hz  and a low frequency filter of 1Hz . EEG data were recorded continuously and digitally stored.   Description: No clear posterior dominant rhythm was seen. Sleep was characterized by vertex waves, maximal frontocentral region.  EEG showed continuous generalized 6-8 Hz theta-alpha activity.     Hyperventilation and photic stimulation were not performed.    Abnormality - Continuous slow, generalized  IMPRESSION: This study is suggestive of mild to moderate diffuse encephalopathy. No seizures or epileptiform discharges were seen throughout the recording.    HEAD NECK CTA - PERFUSION   FINDINGS: CTA NECK FINDINGS  Aortic arch: Examination technically limited by timing of the contrast bolus, with poor opacification of the arterial system.  Visualized aortic arch of normal caliber with normal branch pattern. Moderate atherosclerotic change about the arch and origin of the great vessels without hemodynamically significant stenosis. Visualized subclavian arteries widely  patent.  Right carotid system: Right CCA tortuous proximally but is widely patent to the bifurcation without flow-limiting stenosis. Extensive bulky calcified plaque about the right bifurcation/proximal right ICA with resultant severe stenosis of at least 75% by NASCET criteria. Right ICA otherwise patent distally to the skull base without stenosis, dissection, or occlusion.  Left carotid system: Left CCA mildly tortuous but patent to the bifurcation without flow-limiting stenosis. Extensive bulky plaque about the left bifurcation/proximal left ICA with resultant severe stenosis of up to left ICA otherwise patent to the  skull base without stenosis, dissection or occlusion. Approximately 80% by NASCET criteria.  Vertebral arteries: Both vertebral arteries arise from the subclavian arteries. Atheromatous plaque at the origin of the vertebral arteries bilaterally with estimated 50-75% ostial stenosis bilaterally, left greater than right. Left vertebral artery slightly dominant. Vertebral arteries otherwise patent within the neck without stenosis, dissection, or occlusion.  Skeleton: No acute osseous abnormality. No discrete or worrisome osseous lesions. Exaggeration of the normal thoracic kyphosis noted. Degenerative changes noted about the left TMJ.  Other neck: No other acute soft tissue abnormality within the neck. No mass lesion or adenopathy.  Upper chest: Mild scattered atelectatic changes noted within the visualized lungs. Underlying emphysema. Cardiomegaly with scattered coronary artery calcifications partially visualized. Visualized upper chest demonstrates no other acute finding.  Review of the MIP images confirms the above findings  CTA HEAD FINDINGS  Anterior circulation: Examination of the intracranial circulation technically limited by timing of the contrast bolus. Petrous segments patent bilaterally. Extensive calcified plaque throughout the carotid siphons with associated moderate to severe diffuse narrowing. ICA termini well perfused. A1 segments patent bilaterally. Normal anterior communicating artery. Anterior cerebral arteries patent to their distal aspects without stenosis. No M1 stenosis or occlusion. Normal MCA bifurcations. Distal MCA branches well perfused and symmetric.  Posterior circulation: Mild nonstenotic plaque noted within the proximal left V4 segment without significant stenosis. Left vertebral otherwise widely patent to the vertebrobasilar junction. Focal plaque within the proximal right V4 segment with short-segment mild to moderate stenosis. Left  PICA patent. Right PICA not well seen. Basilar patent to its distal aspect without stenosis. Superior cerebral arteries patent bilaterally. Both PCAs primarily supplied via the basilar. PCAs well perfused to their distal aspects without stenosis.  Venous sinuses: Not well assessed due to timing of the contrast bolus.  Anatomic variants: None significant.  Review of the MIP images confirms the above findings  CT Brain Perfusion Findings:  ASPECTS: 10  CT perfusion portion of this exam failed due to poor timing of the contrast bolus. Study is nondiagnostic for evaluation of acute ischemia or perfusion abnormality.  IMPRESSION: CTA HEAD AND NECK IMPRESSION:  1. Technically limited exam due to timing of the contrast bolus. 2. Negative CTA for emergent large vessel occlusion. 3. Heavy atheromatous plaque about the carotid bifurcations with associated stenoses of up to at least 75-80% by NASCET criteria bilaterally, left slightly worse than right. 4. Atheromatous plaque at the origins of both vertebral arteries with estimated moderate to severe 50-75% stenoses bilaterally. Left vertebral artery dominant. 5. Extensive calcified plaque throughout the carotid siphons with associated moderate to severe diffuse narrowing. 6. Aortic Atherosclerosis (ICD10-I70.0) and Emphysema (ICD10-J43.9).  CT PERFUSION IMPRESSION:  CT perfusion portion of this exam failed due to poor timing of the contrast bolus. Examination is nondiagnostic for evaluation of possible ischemia or other perfusion abnormality.    BRAIN MRI FINDINGS: Brain: No restricted diffusion or evidence of acute infarction. Chronic lacunar type infarct of  the left corona radiata and extending to the left deep gray nuclei.  Occasional superimposed small areas of chronic cortical encephalomalacia such as the right superior frontal gyrus on series 8, image 42, pre motor area. Scattered other white matter T2  and FLAIR hyperintensity, although generally mild for age. There are occasional chronic micro hemorrhages suspected (left periatrial white matter series 9, image 12). Brainstem and cerebellum appear largely negative.  Cavum septum pellucidum, normal variant. No midline shift, mass effect, evidence of mass lesion, ventriculomegaly, extra-axial collection or acute intracranial hemorrhage. Cervicomedullary junction and pituitary are within normal limits.  Vascular: Major intracranial vascular flow voids is are preserved.  Skull and upper cervical spine: Degenerative ligamentous hypertrophy about the odontoid. Visualized bone marrow signal is within normal limits.  Sinuses/Orbits: Postoperative changes to both globes, otherwise negative orbits. Paranasal sinus mucosal thickening is stable and most pronounced in the left maxillary sinus.  Other: Mastoid air cells are well pneumatized. Limited detail of the internal auditory structures. Scalp and face soft tissues appear grossly negative.  IMPRESSION: 1. No acute intracranial abnormality. 2. Chronic small vessel ischemia, most pronounced in the left corona Radiata.    Alezander Dimaano A. Merlene [name], M.D.  Diplomate, Tax adviser of Psychiatry and Neurology ( Neurology). 07/06/2019, 4:58 PM

## 2019-07-07 LAB — GLUCOSE, CAPILLARY
Glucose-Capillary: 113 mg/dL — ABNORMAL HIGH (ref 70–99)
Glucose-Capillary: 114 mg/dL — ABNORMAL HIGH (ref 70–99)
Glucose-Capillary: 119 mg/dL — ABNORMAL HIGH (ref 70–99)
Glucose-Capillary: 125 mg/dL — ABNORMAL HIGH (ref 70–99)
Glucose-Capillary: 130 mg/dL — ABNORMAL HIGH (ref 70–99)

## 2019-07-07 LAB — MAGNESIUM: Magnesium: 2.5 mg/dL — ABNORMAL HIGH (ref 1.7–2.4)

## 2019-07-07 LAB — PROTIME-INR
INR: 2.7 — ABNORMAL HIGH (ref 0.8–1.2)
Prothrombin Time: 28.9 seconds — ABNORMAL HIGH (ref 11.4–15.2)

## 2019-07-07 MED ORDER — WARFARIN - PHARMACIST DOSING INPATIENT: Freq: Every day | Status: DC

## 2019-07-07 MED ORDER — WARFARIN SODIUM 2.5 MG PO TABS
2.5000 mg | ORAL_TABLET | Freq: Once | ORAL | Status: AC
  Administered 2019-07-07: 2.5 mg via ORAL
  Filled 2019-07-07: qty 1

## 2019-07-07 NOTE — Progress Notes (Signed)
ANTICOAGULATION CONSULT NOTE -   Pharmacy Consult for warfarin dosing  Indication: atrial fibrillation               Temp: 97.8 F (36.6 C) (03/27 0459) Temp Source: Oral (03/27 0459) BP: 152/64 (03/27 0459) Pulse Rate: 65 (03/27 0927)  Labs: Recent Labs    07/05/19 0610 07/06/19 0526 07/07/19 0221  HGB 12.1* 11.8*  --   HCT 40.2 39.2  --   PLT 153 143*  --   LABPROT 37.4* 39.3* 28.9*  INR 3.8* 4.0* 2.7*  CREATININE 1.37* 1.23  --     Estimated Creatinine Clearance: 31.3 mL/min (by C-G formula based on SCr of 1.23 mg/dL).    Goal of Therapy:  INR 2-3 Monitor platelets by anticoagulation protocol: Yes   Prior to Admission Warfarin Dosing:  [name] Nafziger takes 2.5mg  of warfarin every day of the week except on M and F, takes 5mg       Lab Results  Component Value Date   INR 2.7 (H) 07/07/2019   INR 4.0 (H) 07/06/2019   INR 3.8 (H) 07/05/2019   Assessment: Scott [name] a 86 y.o. male requires anticoagulation with warfarin for the indication of  atrial fibrillation. Warfarin will be initiated inpatient following pharmacy protocol per pharmacy consult. Patient most recent blood work is as follows: CBC Latest Ref Rng & Units 07/06/2019 07/05/2019 07/03/2019  WBC 4.0 - 10.5 K/uL 5.5 9.3 10.9(H)  Hemoglobin 13.0 - 17.0 g/dL 11.8(L) 12.1(L) 11.7(L)  Hematocrit 39.0 - 52.0 % 39.2 40.2 37.9(L)  Platelets 150 - 400 K/uL 143(L) 153 119(L)    07/07/19 INR: 2.7 today Patient's oral intake is improving   Plan: Give warfarin 2.5mg  today for INR 2.7 Obtain CBC MWF with am labs   Obtain  INR daily Monitor for signs and symptoms of bleeding   Despina Pole, Pharm. D. Clinical Pharmacist 07/07/2019 10:08 AM

## 2019-07-07 NOTE — Progress Notes (Signed)
Patient Demographics:    Scott [name], is a 86 y.o. male, DOB - [birth year], KGY:[id number]  Admit date - 07/01/2019   Admitting Physician Neena Rhymes, MD  Outpatient Primary MD for the patient is Dettinger, Fransisca Kaufmann, MD  LOS - 6   Chief Complaint  Patient presents with   Altered Mental Status        Subjective:    Scott [name] today has no  emesis,    --- Had tachyarrhythmia overnight, serum magnesium is 2.5 -Denies chest pain -No further bleeding concerns   Assessment  & Plan :    Principal Problem:   Sepsis due to other etiology [facility]) Active Problems:   UTI (urinary tract infection)   Paroxysmal atrial fibrillation (HCC)   Acute combined systolic (congestive) and diastolic (congestive) heart failure (HCC)   Benign localized hyperplasia of prostate with urinary obstruction   Chronic combined systolic and diastolic heart failure (HCC)   NICM (nonischemic cardiomyopathy)- EF 20-25% by Echo 04/12/13   Essential hypertension   Chronic kidney disease (CKD) stage G3a/A1, moderately decreased glomerular filtration rate (GFR) between 45-59 mL/min/1.73 square meter and albuminuria creatinine ratio less than 30 mg/g (HCC)   Goals of care, counseling/discussion   Palliative care encounter  Brief Summary:- 86 y.o. 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 fib, hypertension, chronic kidney disease stage III, COPD, bladder cancer, CAD, left AKA admitted with fevers and sepsis pathophysiology on 07/01/2019 with concerns about urinary source -Now with concerns about  Seizures -Awaiting SNF rehab   A/p 1)Sepsis of unknown source ---on admission pt met sepsis criteria with fevers, tachycardia , tachypnea , Leukocytosis and Hypoxia  -WBC is down to 10.9 from 14.3 however anticipate further rising WBC with initiation of steroid therapy for  COPD -Patient was treated with Rocephin (started 07/01/19) through 07/03/19 --Rocephin was discontinued due to concerns about contribution towards possible seizures -UA suspicious for possible UTI however patient has history of bladder cancer and BPH -Chest x-ray without pneumonia, however patient had wheezing and hypoxia consistent with COPD -blood and urine cultures NGTD  2)HFrEF--- patient with combined diastolic and systolic dysfunction CHF last known EF 35 to 40%, repeat echo pending--- chest x-ray without overt CHF exacerbation, BNP elevated at 1507 (current BNP level which are similar to patient's prior) -Patient with wheezing, shortness of breath and hypoxia--more consistent with COPD exacerbation rather CHF exacerbation -Troponins were elevated peaking at 2252--- trended down, -Patient with history of previous elevated troponins ---Repeat echocardiogram with similar EF to prior -Repeat chest x-ray on 07/03/2019 without acute findings  3)Acute COPD Exacerbation- no definite pneumonia,  - c/n  mucolytics, antibiotics (as above in # 1)  and bronchodilators as ordered, supplemental oxygen as ordered. --Okay to complete steroids as ordered  4)Possible Seizures-----suspect partial seizures, patient had multiple episodes of "unresponsiveness" concerning for seizures on 07/02/2019 and again on 07/03/2019----caregiver Remo Lipps took several videos while patient was unresponsive ---My serial exam leaves me suspicious for possible seizures ---No jerky or grand mal type activity-   Has intermittent episodes of unresponsiveness followed by severe lethargy-suspect post ictal somnolence -- stroke work-up including MRI brain, CT head and CTA head and neck essentially negative -EEG without acute findings --Patient  received IV Keppra on 07/03/2019 and again on 07/04/2019 -As per neurologist okay to stop Keppra -May use IV lorazepam as needed seizures -Change gabapentin to 300 mg twice daily -Neurology  consult from Dr. Merlene Laughter appreciated --Rocephin was discontinued due to concerns about contribution to was possible seizures -Repeat EEG on 07/06/2019 without acute findings --Patient becoming more awake  5)H/o PAFib--- on telemetry episodes of tachyarrhythmia --atrial and junctional tachycardia from time to time -Continue to keep magnesium and potassium in range --Coumadin therapy as below a #8 -Continue Coreg for rate control at 6.25 mg twice daily  6)CKD stage -  IIIb---- appears at baseline,  creatinine on admission= 2.5 , baseline creatinine =  2.19 (01/08/19)  , creatinine is now=  1.2, renally adjust medications, avoid nephrotoxic agents / dehydration  / hypotension -Renal function improving  7)H/o PAD/CAD--- stable, continue aspirin, continue Coreg, -continue simvastatin, continue isosorbide and hydralazine combo -- Coumadin therapy as in #8  8)History of DVT/PE --- PTA was on Coumadin therapy--- INR is is down to 2.7 --restart Coumadin per pharmacy  9)Dementia without behavioral disturbances -Continue Aricept and Zoloft,   10)Acute hypoxic respiratory failure secondary to #1 and #3 above--continue supplemental oxygen  Disposition/Need for in-Hospital Stay- patient unable to be discharged at this time due to ------ no further significant seizures noted Awaiting SNF rehab   code Status : DNR  Family Communication:   (---- Discussed with caregiver [name] at bedside -Also discussed with patient's son   Consults  :  na  DVT Prophylaxis  : Coumadin therapy SCDs   Lab Results  Component Value Date   PLT 143 (L) 07/06/2019    Inpatient Medications  Scheduled Meds:  aspirin EC  81 mg Oral Daily   carvedilol  6.25 mg Oral BID WC   donepezil  10 mg Oral QHS   febuxostat  40 mg Oral Daily   fluticasone  1 spray Each Nare QHS   folic acid  1 mg Oral Daily   gabapentin  300 mg Oral BID   guaiFENesin  600 mg Oral BID   isosorbide dinitrate  20 mg Oral TID     And   hydrALAZINE  37.5 mg Oral TID   mouth rinse  15 mL Mouth Rinse BID   mirabegron ER  25 mg Oral q morning - 10a   pantoprazole  40 mg Oral Daily   predniSONE  40 mg Oral Q breakfast   sertraline  50 mg Oral q morning - 10a   simvastatin  10 mg Oral q1800   sodium chloride flush  3 mL Intravenous Q12H   warfarin  2.5 mg Oral ONCE-1600   Warfarin - Pharmacist Dosing Inpatient   Does not apply q1800   Continuous Infusions:  sodium chloride     dextrose 5 % and 0.45% NaCl 50 mL/hr at 07/06/19 2043   PRN Meds:.sodium chloride, acetaminophen, HYDROcodone-acetaminophen, ipratropium-albuterol, LORazepam, ondansetron (ZOFRAN) IV, sodium chloride flush    Anti-infectives (From admission, onward)   Start     Dose/Rate Route Frequency Ordered Stop   07/02/19 1600  azithromycin (ZITHROMAX) tablet 250 mg     250 mg Oral Daily 07/01/19 1812 07/05/19 1658   07/02/19 1500  cefTRIAXone (ROCEPHIN) 1 g in sodium chloride 0.9 % 100 mL IVPB  Status:  Discontinued     1 g 200 mL/hr over 30 Minutes Intravenous Every 24 hours 07/01/19 1812 07/03/19 1812   07/01/19 1500  cefTRIAXone (ROCEPHIN) 1 g in sodium chloride 0.9 %  100 mL IVPB     1 g 200 mL/hr over 30 Minutes Intravenous  Once 07/01/19 1452 07/01/19 1549   07/01/19 1500  azithromycin (ZITHROMAX) 500 mg in sodium chloride 0.9 % 250 mL IVPB  Status:  Discontinued     500 mg 250 mL/hr over 60 Minutes Intravenous Every 24 hours 07/01/19 1452 07/01/19 2048       Objective:   Vitals:   07/07/19 0459 07/07/19 0500 07/07/19 0927 07/07/19 1425  BP: (!) 152/64   137/60  Pulse: 61  65 76  Resp: 18   18  Temp: 97.8 F (36.6 C)   98.5 F (36.9 C)  TempSrc: Oral   Oral  SpO2: 100%   98%  Weight:  70.7 kg    Height:        Wt Readings from Last 3 Encounters:  07/07/19 70.7 kg  01/26/19 86.2 kg  12/26/18 86.2 kg     Intake/Output Summary (Last 24 hours) at 07/07/2019 1727 Last data filed at 07/07/2019 1000 Gross per 24  hour  Intake 1005.47 ml  Output 700 ml  Net 305.47 ml    Physical Exam Gen:- Awake Alert,  In no apparent distress HEENT:- [place].AT, No sclera icterus Neck-Supple Neck,No JVD,.  Lungs-improving air movement, no wheezing CV- S1, S2 normal, irregularly irregular,  Abd-  +ve B.Sounds, Abd Soft, No tenderness, no CVA tenderness Extremity/Skin:- No  edema, Rt pedal pulses present, Lt AKA Psych-more awake, less lethargic  neuro-generalized weakness no new focal deficits, no tremors   Data Review:   Micro Results Recent Results (from the past 240 hour(s))  Blood Culture (routine x 2)     Status: None   Collection Time: 07/01/19  2:52 PM   Specimen: BLOOD RIGHT ARM  Result Value Ref Range Status   Specimen Description BLOOD RIGHT ARM  Final   Special Requests   Final    BOTTLES DRAWN AEROBIC AND ANAEROBIC Blood Culture results may not be optimal due to an inadequate volume of blood received in culture bottles   Culture   Final    NO GROWTH 5 DAYS Performed at [facility], [address] [place], [place] [address]    Report Status 07/06/2019 FINAL  Final  Blood Culture (routine x 2)     Status: None   Collection Time: 07/01/19  3:15 PM   Specimen: BLOOD LEFT ARM  Result Value Ref Range Status   Specimen Description BLOOD LEFT ARM  Final   Special Requests   Final    BOTTLES DRAWN AEROBIC AND ANAEROBIC Blood Culture results may not be optimal due to an inadequate volume of blood received in culture bottles   Culture   Final    NO GROWTH 5 DAYS Performed at [facility], [address] [place], [place] [address]    Report Status 07/06/2019 FINAL  Final  SARS CORONAVIRUS 2 (TAT 6-24 HRS) Nasopharyngeal Nasopharyngeal Swab     Status: None   Collection Time: 07/01/19  4:21 PM   Specimen: Nasopharyngeal Swab  Result Value Ref Range Status   SARS Coronavirus 2 NEGATIVE NEGATIVE Final    Comment: (NOTE) SARS-CoV-2 target nucleic acids are NOT DETECTED. The SARS-CoV-2 RNA is  generally detectable in upper and lower respiratory specimens during the acute phase of infection. Negative results do not preclude SARS-CoV-2 infection, do not rule out co-infections with other pathogens, and should not be used as the sole basis for treatment or other patient management decisions. Negative results must be combined  with clinical observations, patient history, and epidemiological information. The expected result is Negative. Fact Sheet for Patients: SugarRoll.be Fact Sheet for Healthcare Providers: https://www.woods-mathews.com/ This test is not yet approved or cleared by the [place] FDA and  has been authorized for detection and/or diagnosis of SARS-CoV-2 by FDA under an Emergency Use Authorization (EUA). This EUA will remain  in effect (meaning this test can be used) for the duration of the COVID-19 declaration under Section 56 4(b)(1) of the Act, 21 U.S.C. section 360bbb-3(b)(1), unless the authorization is terminated or revoked sooner. Performed at [facility], [place] [address], [place], [place] [address]   Urine culture     Status: None   Collection Time: 07/01/19 10:15 PM   Specimen: In/Out Cath Urine  Result Value Ref Range Status   Specimen Description   Final    IN/OUT CATH URINE Performed at [facility], [address], [place], [place] [address]    Special Requests   Final    NONE Performed at [facility], [address] [place], [place] [address]    Culture   Final    NO GROWTH Performed at [facility], [place] [address] [place], [place] [address]    Report Status 07/03/2019 FINAL  Final  MRSA PCR Screening     Status: None   Collection Time: 07/02/19  7:51 PM   Specimen: Nasal Mucosa; Nasopharyngeal  Result Value Ref Range Status   MRSA by PCR NEGATIVE NEGATIVE Final    Comment:        The GeneXpert MRSA Assay (FDA approved for NASAL specimens only), is one component of  a comprehensive MRSA colonization surveillance program. It is not intended to diagnose MRSA infection nor to guide or monitor treatment for MRSA infections. Performed at [facility], [address] [place], [place] [address]     Radiology Reports CT ANGIO HEAD W OR WO CONTRAST  Result Date: 07/02/2019 CLINICAL DATA:  Initial evaluation for acute altered mental status. EXAM: CT ANGIOGRAPHY HEAD AND NECK CT PERFUSION BRAIN TECHNIQUE: Multidetector CT imaging of the head and neck was performed using the standard protocol during bolus administration of intravenous contrast. Multiplanar CT image reconstructions and MIPs were obtained to evaluate the vascular anatomy. Carotid stenosis measurements (when applicable) are obtained utilizing NASCET criteria, using the distal internal carotid diameter as the denominator. Multiphase CT imaging of the brain was performed following IV bolus contrast injection. Subsequent parametric perfusion maps were calculated using RAPID software. CONTRAST:  182m OMNIPAQUE IOHEXOL 350 MG/ML SOLN COMPARISON:  Prior head CT from earlier same day. FINDINGS: CTA NECK FINDINGS Aortic arch: Examination technically limited by timing of the contrast bolus, with poor opacification of the arterial system. Visualized aortic arch of normal caliber with normal branch pattern. Moderate atherosclerotic change about the arch and origin of the great vessels without hemodynamically significant stenosis. Visualized subclavian arteries widely patent. Right carotid system: Right CCA tortuous proximally but is widely patent to the bifurcation without flow-limiting stenosis. Extensive bulky calcified plaque about the right bifurcation/proximal right ICA with resultant severe stenosis of at least 75% by NASCET criteria. Right ICA otherwise patent distally to the skull base without stenosis, dissection, or occlusion. Left carotid system: Left CCA mildly tortuous but patent to the bifurcation without  flow-limiting stenosis. Extensive bulky plaque about the left bifurcation/proximal left ICA with resultant severe stenosis of up to left ICA otherwise patent to the skull base without stenosis, dissection or occlusion. Approximately 80% by NASCET criteria. Vertebral  arteries: Both vertebral arteries arise from the subclavian arteries. Atheromatous plaque at the origin of the vertebral arteries bilaterally with estimated 50-75% ostial stenosis bilaterally, left greater than right. Left vertebral artery slightly dominant. Vertebral arteries otherwise patent within the neck without stenosis, dissection, or occlusion. Skeleton: No acute osseous abnormality. No discrete or worrisome osseous lesions. Exaggeration of the normal thoracic kyphosis noted. Degenerative changes noted about the left TMJ. Other neck: No other acute soft tissue abnormality within the neck. No mass lesion or adenopathy. Upper chest: Mild scattered atelectatic changes noted within the visualized lungs. Underlying emphysema. Cardiomegaly with scattered coronary artery calcifications partially visualized. Visualized upper chest demonstrates no other acute finding. Review of the MIP images confirms the above findings CTA HEAD FINDINGS Anterior circulation: Examination of the intracranial circulation technically limited by timing of the contrast bolus. Petrous segments patent bilaterally. Extensive calcified plaque throughout the carotid siphons with associated moderate to severe diffuse narrowing. ICA termini well perfused. A1 segments patent bilaterally. Normal anterior communicating artery. Anterior cerebral arteries patent to their distal aspects without stenosis. No M1 stenosis or occlusion. Normal MCA bifurcations. Distal MCA branches well perfused and symmetric. Posterior circulation: Mild nonstenotic plaque noted within the proximal left V4 segment without significant stenosis. Left vertebral otherwise widely patent to the vertebrobasilar  junction. Focal plaque within the proximal right V4 segment with short-segment mild to moderate stenosis. Left PICA patent. Right PICA not well seen. Basilar patent to its distal aspect without stenosis. Superior cerebral arteries patent bilaterally. Both PCAs primarily supplied via the basilar. PCAs well perfused to their distal aspects without stenosis. Venous sinuses: Not well assessed due to timing of the contrast bolus. Anatomic variants: None significant. Review of the MIP images confirms the above findings CT Brain Perfusion Findings: ASPECTS: 10 CT perfusion portion of this exam failed due to poor timing of the contrast bolus. Study is nondiagnostic for evaluation of acute ischemia or perfusion abnormality. IMPRESSION: CTA HEAD AND NECK IMPRESSION: 1. Technically limited exam due to timing of the contrast bolus. 2. Negative CTA for emergent large vessel occlusion. 3. Heavy atheromatous plaque about the carotid bifurcations with associated stenoses of up to at least 75-80% by NASCET criteria bilaterally, left slightly worse than right. 4. Atheromatous plaque at the origins of both vertebral arteries with estimated moderate to severe 50-75% stenoses bilaterally. Left vertebral artery dominant. 5. Extensive calcified plaque throughout the carotid siphons with associated moderate to severe diffuse narrowing. 6. Aortic Atherosclerosis (ICD10-I70.0) and Emphysema (ICD10-J43.9). CT PERFUSION IMPRESSION: CT perfusion portion of this exam failed due to poor timing of the contrast bolus. Examination is nondiagnostic for evaluation of possible ischemia or other perfusion abnormality. Electronically Signed   By: Jeannine Boga M.D.   On: 07/02/2019 20:27   DG Chest 2 View  Result Date: 07/03/2019 CLINICAL DATA:  Dyspnea EXAM: CHEST - 2 VIEW COMPARISON:  07/01/2019 FINDINGS: Frontal and lateral views of the chest demonstrate a stable cardiac silhouette. There is persistent central vascular congestion. Chronic  background interstitial scarring. No airspace disease, effusion, or pneumothorax. No acute bony abnormalities. IMPRESSION: 1. Chronic central vascular congestion. 2. Chronic interstitial lung disease. 3. No acute airspace disease. Electronically Signed   By: Randa Ngo M.D.   On: 07/03/2019 12:20   CT ANGIO NECK W OR WO CONTRAST  Result Date: 07/02/2019 CLINICAL DATA:  Initial evaluation for acute altered mental status. EXAM: CT ANGIOGRAPHY HEAD AND NECK CT PERFUSION BRAIN TECHNIQUE: Multidetector CT imaging of the head and neck was performed using  the standard protocol during bolus administration of intravenous contrast. Multiplanar CT image reconstructions and MIPs were obtained to evaluate the vascular anatomy. Carotid stenosis measurements (when applicable) are obtained utilizing NASCET criteria, using the distal internal carotid diameter as the denominator. Multiphase CT imaging of the brain was performed following IV bolus contrast injection. Subsequent parametric perfusion maps were calculated using RAPID software. CONTRAST:  149m OMNIPAQUE IOHEXOL 350 MG/ML SOLN COMPARISON:  Prior head CT from earlier same day. FINDINGS: CTA NECK FINDINGS Aortic arch: Examination technically limited by timing of the contrast bolus, with poor opacification of the arterial system. Visualized aortic arch of normal caliber with normal branch pattern. Moderate atherosclerotic change about the arch and origin of the great vessels without hemodynamically significant stenosis. Visualized subclavian arteries widely patent. Right carotid system: Right CCA tortuous proximally but is widely patent to the bifurcation without flow-limiting stenosis. Extensive bulky calcified plaque about the right bifurcation/proximal right ICA with resultant severe stenosis of at least 75% by NASCET criteria. Right ICA otherwise patent distally to the skull base without stenosis, dissection, or occlusion. Left carotid system: Left CCA mildly  tortuous but patent to the bifurcation without flow-limiting stenosis. Extensive bulky plaque about the left bifurcation/proximal left ICA with resultant severe stenosis of up to left ICA otherwise patent to the skull base without stenosis, dissection or occlusion. Approximately 80% by NASCET criteria. Vertebral arteries: Both vertebral arteries arise from the subclavian arteries. Atheromatous plaque at the origin of the vertebral arteries bilaterally with estimated 50-75% ostial stenosis bilaterally, left greater than right. Left vertebral artery slightly dominant. Vertebral arteries otherwise patent within the neck without stenosis, dissection, or occlusion. Skeleton: No acute osseous abnormality. No discrete or worrisome osseous lesions. Exaggeration of the normal thoracic kyphosis noted. Degenerative changes noted about the left TMJ. Other neck: No other acute soft tissue abnormality within the neck. No mass lesion or adenopathy. Upper chest: Mild scattered atelectatic changes noted within the visualized lungs. Underlying emphysema. Cardiomegaly with scattered coronary artery calcifications partially visualized. Visualized upper chest demonstrates no other acute finding. Review of the MIP images confirms the above findings CTA HEAD FINDINGS Anterior circulation: Examination of the intracranial circulation technically limited by timing of the contrast bolus. Petrous segments patent bilaterally. Extensive calcified plaque throughout the carotid siphons with associated moderate to severe diffuse narrowing. ICA termini well perfused. A1 segments patent bilaterally. Normal anterior communicating artery. Anterior cerebral arteries patent to their distal aspects without stenosis. No M1 stenosis or occlusion. Normal MCA bifurcations. Distal MCA branches well perfused and symmetric. Posterior circulation: Mild nonstenotic plaque noted within the proximal left V4 segment without significant stenosis. Left vertebral  otherwise widely patent to the vertebrobasilar junction. Focal plaque within the proximal right V4 segment with short-segment mild to moderate stenosis. Left PICA patent. Right PICA not well seen. Basilar patent to its distal aspect without stenosis. Superior cerebral arteries patent bilaterally. Both PCAs primarily supplied via the basilar. PCAs well perfused to their distal aspects without stenosis. Venous sinuses: Not well assessed due to timing of the contrast bolus. Anatomic variants: None significant. Review of the MIP images confirms the above findings CT Brain Perfusion Findings: ASPECTS: 10 CT perfusion portion of this exam failed due to poor timing of the contrast bolus. Study is nondiagnostic for evaluation of acute ischemia or perfusion abnormality. IMPRESSION: CTA HEAD AND NECK IMPRESSION: 1. Technically limited exam due to timing of the contrast bolus. 2. Negative CTA for emergent large vessel occlusion. 3. Heavy atheromatous plaque about the carotid bifurcations  with associated stenoses of up to at least 75-80% by NASCET criteria bilaterally, left slightly worse than right. 4. Atheromatous plaque at the origins of both vertebral arteries with estimated moderate to severe 50-75% stenoses bilaterally. Left vertebral artery dominant. 5. Extensive calcified plaque throughout the carotid siphons with associated moderate to severe diffuse narrowing. 6. Aortic Atherosclerosis (ICD10-I70.0) and Emphysema (ICD10-J43.9). CT PERFUSION IMPRESSION: CT perfusion portion of this exam failed due to poor timing of the contrast bolus. Examination is nondiagnostic for evaluation of possible ischemia or other perfusion abnormality. Electronically Signed   By: Jeannine Boga M.D.   On: 07/02/2019 20:27   MR BRAIN WO CONTRAST  Result Date: 07/03/2019 CLINICAL DATA:  86 year old male code stroke presentation yesterday. Negative large vessel occlusion on CTA. EXAM: MRI HEAD WITHOUT CONTRAST TECHNIQUE: Multiplanar,  multiecho pulse sequences of the brain and surrounding structures were obtained without intravenous contrast. COMPARISON:  CT head and CTA head and neck yesterday. FINDINGS: Brain: No restricted diffusion or evidence of acute infarction. Chronic lacunar type infarct of the left corona radiata and extending to the left deep gray nuclei. Occasional superimposed small areas of chronic cortical encephalomalacia such as the right superior frontal gyrus on series 8, image 42, pre motor area. Scattered other white matter T2 and FLAIR hyperintensity, although generally mild for age. There are occasional chronic micro hemorrhages suspected (left periatrial white matter series 9, image 12). Brainstem and cerebellum appear largely negative. Cavum septum pellucidum, normal variant. No midline shift, mass effect, evidence of mass lesion, ventriculomegaly, extra-axial collection or acute intracranial hemorrhage. Cervicomedullary junction and pituitary are within normal limits. Vascular: Major intracranial vascular flow voids is are preserved. Skull and upper cervical spine: Degenerative ligamentous hypertrophy about the odontoid. Visualized bone marrow signal is within normal limits. Sinuses/Orbits: Postoperative changes to both globes, otherwise negative orbits. Paranasal sinus mucosal thickening is stable and most pronounced in the left maxillary sinus. Other: Mastoid air cells are well pneumatized. Limited detail of the internal auditory structures. Scalp and face soft tissues appear grossly negative. IMPRESSION: 1. No acute intracranial abnormality. 2. Chronic small vessel ischemia, most pronounced in the left corona radiata. Electronically Signed   By: Genevie Ann M.D.   On: 07/03/2019 18:17   CT CEREBRAL PERFUSION W CONTRAST  Result Date: 07/02/2019 CLINICAL DATA:  Initial evaluation for acute altered mental status. EXAM: CT ANGIOGRAPHY HEAD AND NECK CT PERFUSION BRAIN TECHNIQUE: Multidetector CT imaging of the head and  neck was performed using the standard protocol during bolus administration of intravenous contrast. Multiplanar CT image reconstructions and MIPs were obtained to evaluate the vascular anatomy. Carotid stenosis measurements (when applicable) are obtained utilizing NASCET criteria, using the distal internal carotid diameter as the denominator. Multiphase CT imaging of the brain was performed following IV bolus contrast injection. Subsequent parametric perfusion maps were calculated using RAPID software. CONTRAST:  153m OMNIPAQUE IOHEXOL 350 MG/ML SOLN COMPARISON:  Prior head CT from earlier same day. FINDINGS: CTA NECK FINDINGS Aortic arch: Examination technically limited by timing of the contrast bolus, with poor opacification of the arterial system. Visualized aortic arch of normal caliber with normal branch pattern. Moderate atherosclerotic change about the arch and origin of the great vessels without hemodynamically significant stenosis. Visualized subclavian arteries widely patent. Right carotid system: Right CCA tortuous proximally but is widely patent to the bifurcation without flow-limiting stenosis. Extensive bulky calcified plaque about the right bifurcation/proximal right ICA with resultant severe stenosis of at least 75% by NASCET criteria. Right ICA otherwise patent distally  to the skull base without stenosis, dissection, or occlusion. Left carotid system: Left CCA mildly tortuous but patent to the bifurcation without flow-limiting stenosis. Extensive bulky plaque about the left bifurcation/proximal left ICA with resultant severe stenosis of up to left ICA otherwise patent to the skull base without stenosis, dissection or occlusion. Approximately 80% by NASCET criteria. Vertebral arteries: Both vertebral arteries arise from the subclavian arteries. Atheromatous plaque at the origin of the vertebral arteries bilaterally with estimated 50-75% ostial stenosis bilaterally, left greater than right. Left  vertebral artery slightly dominant. Vertebral arteries otherwise patent within the neck without stenosis, dissection, or occlusion. Skeleton: No acute osseous abnormality. No discrete or worrisome osseous lesions. Exaggeration of the normal thoracic kyphosis noted. Degenerative changes noted about the left TMJ. Other neck: No other acute soft tissue abnormality within the neck. No mass lesion or adenopathy. Upper chest: Mild scattered atelectatic changes noted within the visualized lungs. Underlying emphysema. Cardiomegaly with scattered coronary artery calcifications partially visualized. Visualized upper chest demonstrates no other acute finding. Review of the MIP images confirms the above findings CTA HEAD FINDINGS Anterior circulation: Examination of the intracranial circulation technically limited by timing of the contrast bolus. Petrous segments patent bilaterally. Extensive calcified plaque throughout the carotid siphons with associated moderate to severe diffuse narrowing. ICA termini well perfused. A1 segments patent bilaterally. Normal anterior communicating artery. Anterior cerebral arteries patent to their distal aspects without stenosis. No M1 stenosis or occlusion. Normal MCA bifurcations. Distal MCA branches well perfused and symmetric. Posterior circulation: Mild nonstenotic plaque noted within the proximal left V4 segment without significant stenosis. Left vertebral otherwise widely patent to the vertebrobasilar junction. Focal plaque within the proximal right V4 segment with short-segment mild to moderate stenosis. Left PICA patent. Right PICA not well seen. Basilar patent to its distal aspect without stenosis. Superior cerebral arteries patent bilaterally. Both PCAs primarily supplied via the basilar. PCAs well perfused to their distal aspects without stenosis. Venous sinuses: Not well assessed due to timing of the contrast bolus. Anatomic variants: None significant. Review of the MIP images  confirms the above findings CT Brain Perfusion Findings: ASPECTS: 10 CT perfusion portion of this exam failed due to poor timing of the contrast bolus. Study is nondiagnostic for evaluation of acute ischemia or perfusion abnormality. IMPRESSION: CTA HEAD AND NECK IMPRESSION: 1. Technically limited exam due to timing of the contrast bolus. 2. Negative CTA for emergent large vessel occlusion. 3. Heavy atheromatous plaque about the carotid bifurcations with associated stenoses of up to at least 75-80% by NASCET criteria bilaterally, left slightly worse than right. 4. Atheromatous plaque at the origins of both vertebral arteries with estimated moderate to severe 50-75% stenoses bilaterally. Left vertebral artery dominant. 5. Extensive calcified plaque throughout the carotid siphons with associated moderate to severe diffuse narrowing. 6. Aortic Atherosclerosis (ICD10-I70.0) and Emphysema (ICD10-J43.9). CT PERFUSION IMPRESSION: CT perfusion portion of this exam failed due to poor timing of the contrast bolus. Examination is nondiagnostic for evaluation of possible ischemia or other perfusion abnormality. Electronically Signed   By: Jeannine Boga M.D.   On: 07/02/2019 20:27   DG Chest Port 1 View  Result Date: 07/01/2019 CLINICAL DATA:  Shortness of breath. EXAM: PORTABLE CHEST 1 VIEW COMPARISON:  December 26, 2018. FINDINGS: Stable cardiomediastinal silhouette. No pneumothorax or pleural effusion is noted. Both lungs are clear. The visualized skeletal structures are unremarkable. IMPRESSION: No active disease. Electronically Signed   By: Marijo Conception M.D.   On: 07/01/2019 15:15   EEG  adult  Result Date: 07/06/2019 Phillips Odor, MD     07/06/2019  5:07 PM [place] A. Merlene Laughter, MD     [email or web address]       HISTORY: This is a 86 year old male who presents with unresponsiveness worrisome for nonconvulsive seizures. MEDICATIONS: Current Facility-Administered Medications:   0.9 %   sodium chloride infusion, 250 mL, Intravenous, PRN, Norins, Heinz Knuckles, MD   acetaminophen (TYLENOL) tablet 650 mg, 650 mg, Oral, Q6H PRN, Norins, Heinz Knuckles, MD, 650 mg at 07/02/19 1330   aspirin EC tablet 81 mg, 81 mg, Oral, Daily, Norins, Heinz Knuckles, MD, 81 mg at 07/06/19 0946   carvedilol (COREG) tablet 6.25 mg, 6.25 mg, Oral, BID WC, Norins, Heinz Knuckles, MD, 6.25 mg at 07/06/19 1659   dextrose 5 %-0.45 % sodium chloride infusion, , Intravenous, Continuous, Kyere, Belinda K, NP, Last Rate: 50 mL/hr at 07/06/19 0300, Rate Verify at 07/06/19 0300   donepezil (ARICEPT) tablet 10 mg, 10 mg, Oral, QHS, Norins, Heinz Knuckles, MD, 10 mg at 07/05/19 2145   febuxostat (ULORIC) tablet 40 mg, 40 mg, Oral, Daily, Norins, Heinz Knuckles, MD, 40 mg at 07/06/19 0945   fluticasone (FLONASE) 50 MCG/ACT nasal spray 1 spray, 1 spray, Each Nare, QHS, Norins, Heinz Knuckles, MD, 1 spray at 94/17/40 8144   folic acid (FOLVITE) tablet 1 mg, 1 mg, Oral, Daily, Norins, Heinz Knuckles, MD, 1 mg at 07/06/19 0945   gabapentin (NEURONTIN) capsule 300 mg, 300 mg, Oral, BID, Kiauna Zywicki, MD, 300 mg at 07/06/19 0945   guaiFENesin (MUCINEX) 12 hr tablet 600 mg, 600 mg, Oral, BID, Akyra Bouchie, MD, 600 mg at 07/06/19 0945   isosorbide dinitrate (ISORDIL) tablet 20 mg, 20 mg, Oral, TID, 20 mg at 07/06/19 1659 **AND** hydrALAZINE (APRESOLINE) tablet 37.5 mg, 37.5 mg, Oral, TID, Norins, Heinz Knuckles, MD, 37.5 mg at 07/06/19 1659   HYDROcodone-acetaminophen (NORCO/VICODIN) 5-325 MG per tablet 1 tablet, 1 tablet, Oral, BID PRN, Norins, Heinz Knuckles, MD   ipratropium-albuterol (DUONEB) 0.5-2.5 (3) MG/3ML nebulizer solution 3 mL, 3 mL, Nebulization, Q4H PRN, Norins, Heinz Knuckles, MD, 3 mL at 07/06/19 0410   LORazepam (ATIVAN) injection 0.5 mg, 0.5 mg, Intravenous, Q6H PRN, Dennys Guin, MD, 0.5 mg at 07/05/19 2148   MEDLINE mouth rinse, 15 mL, Mouth Rinse, BID, Johnson, Clanford L, MD, 15 mL at 07/06/19 0946   methylPREDNISolone sodium succinate  (SOLU-MEDROL) 125 mg/2 mL injection 60 mg, 60 mg, Intravenous, Q8H, Alyson Ki, MD, 60 mg at 07/06/19 1209   mirabegron ER (MYRBETRIQ) tablet 25 mg, 25 mg, Oral, q morning - 10a, Norins, Heinz Knuckles, MD, 25 mg at 07/06/19 0945   ondansetron (ZOFRAN) injection 4 mg, 4 mg, Intravenous, Q6H PRN, Norins, Heinz Knuckles, MD   pantoprazole (PROTONIX) EC tablet 40 mg, 40 mg, Oral, Daily, Norins, Heinz Knuckles, MD, 40 mg at 07/06/19 0945   potassium chloride SA (KLOR-CON) CR tablet 10 mEq, 10 mEq, Oral, BID, Norins, Heinz Knuckles, MD, 10 mEq at 07/05/19 2145   [START ON 07/07/2019] predniSONE (DELTASONE) tablet 40 mg, 40 mg, Oral, Q breakfast, Takiya Belmares, MD   sertraline (ZOLOFT) tablet 50 mg, 50 mg, Oral, q morning - 10a, Norins, Heinz Knuckles, MD, 50 mg at 07/06/19 0945   simvastatin (ZOCOR) tablet 10 mg, 10 mg, Oral, q1800, Norins, Heinz Knuckles, MD, 10 mg at 07/05/19 1715   sodium chloride flush (NS) 0.9 % injection 3 mL, 3 mL, Intravenous, Q12H, Norins, Heinz Knuckles, MD, 3 mL at 07/05/19 2149   sodium chloride  flush (NS) 0.9 % injection 3 mL, 3 mL, Intravenous, PRN, Norins, Heinz Knuckles, MD ANALYSIS: A 16 channel recording using standard 10 20 measurements is conducted for 26 minutes.  The background activity is significant for generalized slowing mostly in the six point five to there is beta activity observed in the frontal areas. Photic stimulation and hyperventilation are not conducted.  There are a couple of episodes of generalized rhythmic delta slowing.  No focal slowing is noted.  No epileptiform activity is noted.  IMPRESSION: 1.  This recording of the awake and drowsy state shows mild global slowing.  No epileptiform activities are noted. Kofi A. Merlene Laughter, M.D. Diplomate, Tax adviser of Psychiatry and Neurology ( Neurology).   EEG adult  Result Date: 07/03/2019 Lora Havens, MD     07/03/2019  6:59 PM Patient Name: Scott [name]: [id number] Epilepsy Attending: Lora Havens Referring  Physician/Provider: Dr Roxan Hockey Date: 07/03/2019 Duration: 24.14 mins Patient history: 86 year old male with transient alteration of awareness.  EEG evaluate for seizures. Level of alertness: Awake, asleep AEDs during EEG study: Gabapentin Technical aspects: This EEG study was done with scalp electrodes positioned according to the 10-20 International system of electrode placement. Electrical activity was acquired at a sampling rate of 500Hz  and reviewed with a high frequency filter of 70Hz  and a low frequency filter of 1Hz . EEG data were recorded continuously and digitally stored. Description: No clear posterior dominant rhythm was seen. Sleep was characterized by vertex waves, maximal frontocentral region.  EEG showed continuous generalized 6-8 Hz theta-alpha activity.     Hyperventilation and photic stimulation were not performed.  Abnormality - Continuous slow, generalized IMPRESSION: This study is suggestive of mild to moderate diffuse encephalopathy. No seizures or epileptiform discharges were seen throughout the recording. Lora Havens   ECHOCARDIOGRAM COMPLETE  Result Date: 07/02/2019    ECHOCARDIOGRAM REPORT   Patient Name:   ORVA GWALTNEY Bagdasarian Date of Exam: 07/02/2019 Medical Rec #:  [id number]     Height:       65.0 in Accession #:    [id number]    Weight:       147.9 lb Date of Birth:  08/09/1921      BSA:          1.740 m Patient Age:    89 years      BP:           130/56 mmHg Patient Gender: M             HR:           40 bpm. Exam Location:  [facility] Procedure: 2D Echo Indications:    CHF-Acute Systolic 630.16 / W10.93  History:        Patient has prior history of Echocardiogram examinations, most                 recent 12/11/2018. CAD and Previous Myocardial Infarction,                 Arrythmias:Atrial Fibrillation, Signs/Symptoms:Dyspnea; Risk                 Factors:Dyslipidemia, Hypertension and Former Smoker. NICM ,                 Bradycardia.  Sonographer:    Leavy Cella RDCS (AE)  Referring Phys: [place]  1. Left ventricular ejection fraction, by estimation, is 35 to 40%. The left ventricle has moderately decreased function. The  left ventricle demonstrates regional wall motion abnormalities (see scoring diagram/findings for description). Left ventricular  diastolic parameters are indeterminate.  2. Right ventricular systolic function is moderately reduced. The right ventricular size is normal. There is moderately elevated pulmonary artery systolic pressure. The estimated right ventricular systolic pressure is 95.6 mmHg.  3. Left atrial size was mildly dilated.  4. The mitral valve is degenerative. Mild mitral valve regurgitation.  5. The aortic valve is tricuspid, moderately calcified with restricted motion of the left coronary cusp. Aortic valve regurgitation is not visualized.  6. The inferior vena cava is normal in size with greater than 50% respiratory variability, suggesting right atrial pressure of 3 mmHg. FINDINGS  Left Ventricle: Left ventricular ejection fraction, by estimation, is 35 to 40%. The left ventricle has moderately decreased function. The left ventricle demonstrates regional wall motion abnormalities. The left ventricular internal cavity size was normal in size. There is borderline left ventricular hypertrophy. Left ventricular diastolic parameters are indeterminate.  LV Wall Scoring: The basal inferior segment is akinetic. The mid inferior segment is hypokinetic. Right Ventricle: The right ventricular size is normal. No increase in right ventricular wall thickness. Right ventricular systolic function is moderately reduced. There is moderately elevated pulmonary artery systolic pressure. The tricuspid regurgitant velocity is 3.66 m/s, and with an assumed right atrial pressure of 3 mmHg, the estimated right ventricular systolic pressure is 38.7 mmHg. Left Atrium: Left atrial size was mildly dilated. Right Atrium: Right atrial size was normal in  size. Pericardium: There is no evidence of pericardial effusion. Mitral Valve: The mitral valve is degenerative in appearance. Moderate mitral annular calcification. Mild mitral valve regurgitation. Tricuspid Valve: The tricuspid valve is grossly normal. Tricuspid valve regurgitation is mild. Aortic Valve: The aortic valve is tricuspid. Aortic valve regurgitation is not visualized. Moderate aortic valve annular calcification. There is moderate calcification of the aortic valve. Pulmonic Valve: The pulmonic valve was not well visualized. Pulmonic valve regurgitation is not visualized. Aorta: The aortic root is normal in size and structure. Venous: The inferior vena cava is normal in size with greater than 50% respiratory variability, suggesting right atrial pressure of 3 mmHg. IAS/Shunts: No atrial level shunt detected by color flow Doppler.  LEFT VENTRICLE PLAX 2D LVIDd:         5.14 cm  Diastology LVIDs:         3.31 cm  LV e' lateral:   5.11 cm/s LV PW:         1.08 cm  LV E/e' lateral: 18.5 LV IVS:        1.06 cm  LV e' medial:    3.15 cm/s LVOT diam:     2.20 cm  LV E/e' medial:  29.9 LVOT Area:     3.80 cm  RIGHT VENTRICLE RV S prime:     8.05 cm/s TAPSE (M-mode): 1.2 cm LEFT ATRIUM             Index LA diam:        3.60 cm 2.07 cm/m LA Vol (A2C):   61.0 ml 35.05 ml/m LA Vol (A4C):   56.7 ml 32.58 ml/m LA Biplane Vol: 60.6 ml 34.82 ml/m   AORTA Ao Root diam: 3.30 cm MITRAL VALVE                TRICUSPID VALVE MV Area (PHT): 2.55 cm     TR Peak grad:   53.6 mmHg MV Decel Time: 297 msec     TR Vmax:  366.00 cm/s MV E velocity: 94.30 cm/s MV A velocity: 123.00 cm/s  SHUNTS MV E/A ratio:  0.77         Systemic Diam: 2.20 cm Rozann Lesches MD Electronically signed by Rozann Lesches MD Signature Date/Time: 07/02/2019/2:59:42 PM    Final    CT HEAD CODE STROKE WO CONTRAST`  Addendum Date: 07/02/2019   ADDENDUM REPORT: 07/02/2019 18:33 ADDENDUM: Study discussed by telephone with Dr. Roxan Hockey on  07/02/2019 at [place] hours. Electronically Signed   By: Genevie Ann M.D.   On: 07/02/2019 18:33   Result Date: 07/02/2019 CLINICAL DATA:  Code stroke. 86 year old male with sudden change in behavior, now mostly nonresponsive. EXAM: CT HEAD WITHOUT CONTRAST TECHNIQUE: Contiguous axial images were obtained from the base of the skull through the vertex without intravenous contrast. COMPARISON:  Head CT 12/21/2018. FINDINGS: Brain: Stable cerebral volume. No ventriculomegaly. Cavum septum pellucidum, normal variant. 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 Stable gray-white matter differentiation throughout the brain. No acute intracranial hemorrhage identified. No midline shift, mass effect, or evidence of intracranial mass lesion. No cortically based acute infarct identified. Vascular: Extensive Calcified atherosclerosis at the skull base.6 No suspicious intracranial vascular hyperdensity. [id number] Skull: No acute osseous abnormality identified. Sinuses/Orbits: Stable paranasal sinuses since last year. Mastoids remain clear. Other: No acute orbit or scalp soft tissue finding. ASPECTS [facility] Stroke Program Early CT Score) Total score (0-10 with 10 being normal): 10 IMPRESSION: 1. Stable non contrast CT appearance of the brain since 2020. No acute cortically based infarct or acute intracranial hemorrhage identified. ASPECTS 10. 2. Chronic small vessel ischemia in the left corona radiata and basal ganglia. Electronically Signed: By: Genevie Ann M.D. On: 07/02/2019 18:27     CBC Recent Labs  Lab 07/01/19 1452 07/01/19 1452 07/02/19 0443 07/02/19 1247 07/03/19 0523 07/05/19 0610 07/06/19 0526  WBC 14.3*   < > 12.5* 11.3* 10.9* 9.3 5.5  HGB 13.7   < > 13.0 12.3* 11.7* 12.1* 11.8*  HCT 43.5   < > 41.7 39.5 37.9* 40.2 39.2  PLT 158   < > 131* 120* 119* 153 143*  MCV 87.7   < > 89.5 88.8 89.2 90.1 91.0  MCH 27.6   < > 27.9 27.6 27.5 27.1 27.4  MCHC 31.5   < > 31.2 31.1 30.9 30.1 30.1  RDW  15.0   < > 15.5 15.3 15.7* 15.4 15.5  LYMPHSABS 1.1  --   --  0.8  --   --   --   MONOABS 0.8  --   --  0.8  --   --   --   EOSABS 0.0  --   --  0.0  --   --   --   BASOSABS 0.0  --   --  0.0  --   --   --    < > = values in this interval not displayed.    Chemistries  Recent Labs  Lab 07/01/19 1452 07/01/19 1452 07/02/19 0443 07/02/19 1247 07/03/19 0523 07/05/19 0610 07/06/19 0526 07/07/19 0220  NA 141   < > 147* 144 143 143 144  --   K 3.9   < > 4.1 4.4 4.3 4.6 4.4  --   CL 103   < > 108 106 108 108 107  --   CO2 27   < > 27 29 26 25 27   --   GLUCOSE 101*   < > 103* 117* 142* 138* 93  --  BUN 55*   < > 52* 54* 55* 56* 48*  --   CREATININE 2.50*   < > 2.22* 2.26* 1.94* 1.37* 1.23  --   CALCIUM 9.5   < > 9.5 9.1 8.8* 9.2 9.2  --   MG  --   --  2.4  --   --   --  2.4 2.5*  AST 24  --   --   --   --   --   --   --   ALT 16  --   --   --   --   --   --   --   ALKPHOS 157*  --   --   --   --   --   --   --   BILITOT 0.9  --   --   --   --   --   --   --    < > = values in this interval not displayed.  ------------------------------------------------------------------------------------------------------------------ No results for input(s): CHOL, HDL, LDLCALC, TRIG, CHOLHDL, LDLDIRECT in the last 72 hours.  Lab Results  Component Value Date   HGBA1C 5.8 (H) 11/28/2015   ------------------------------------------------------------------------------------------------------------------ No results for input(s): TSH, T4TOTAL, T3FREE, THYROIDAB in the last 72 hours.  Invalid input(s): FREET3 ------------------------------------------------------------------------------------------------------------------ No results for input(s): VITAMINB12, FOLATE, FERRITIN, TIBC, IRON, RETICCTPCT in the last 72 hours.  Coagulation profile Recent Labs  Lab 07/03/19 0523 07/04/19 0526 07/05/19 0610 07/06/19 0526 07/07/19 0221  INR 2.9* 3.9* 3.8* 4.0* 2.7*    No results for input(s):  DDIMER in the last 72 hours.  Cardiac Enzymes No results for input(s): CKMB, TROPONINI, MYOGLOBIN in the last 168 hours.  Invalid input(s): CK ------------------------------------------------------------------------------------------------------------------    Component Value Date/Time   BNP 1,193.0 (H) 07/02/2019 1247    Roxan Hockey M.D on 07/07/2019 at 5:27 PM  Go to [email or web address] - for contact info  [facility] - Office  [phone]

## 2019-07-07 NOTE — Progress Notes (Signed)
Notified by telemetry that had 30 beats of wide complex junctional rhythm. Pt is resting in bed with eyes closed at this time. VSS. MD made aware via Amion.

## 2019-07-08 LAB — URINALYSIS, ROUTINE W REFLEX MICROSCOPIC
Bacteria, UA: NONE SEEN
RBC / HPF: >50 RBC/hpf — ABNORMAL HIGH (ref 0–5)

## 2019-07-08 LAB — BASIC METABOLIC PANEL
Anion gap: 7 (ref 5–15)
BUN: 40 mg/dL — ABNORMAL HIGH (ref 8–23)
CO2: 30 mmol/L (ref 22–32)
Calcium: 9.7 mg/dL (ref 8.9–10.3)
Chloride: 109 mmol/L (ref 98–111)
Creatinine, Ser: 1.1 mg/dL (ref 0.61–1.24)
GFR calc Af Amer: >60 mL/min (ref >60)
GFR calc non Af Amer: 57 mL/min — ABNORMAL LOW (ref >60)
Glucose, Bld: 101 mg/dL — ABNORMAL HIGH (ref 70–99)
Potassium: 4.1 mmol/L (ref 3.5–5.1)
Sodium: 146 mmol/L — ABNORMAL HIGH (ref 135–145)

## 2019-07-08 LAB — GLUCOSE, CAPILLARY
Glucose-Capillary: 124 mg/dL — ABNORMAL HIGH (ref 70–99)
Glucose-Capillary: 130 mg/dL — ABNORMAL HIGH (ref 70–99)
Glucose-Capillary: 148 mg/dL — ABNORMAL HIGH (ref 70–99)

## 2019-07-08 LAB — PROTIME-INR
INR: 2.3 — ABNORMAL HIGH (ref 0.8–1.2)
Prothrombin Time: 25.3 seconds — ABNORMAL HIGH (ref 11.4–15.2)

## 2019-07-08 LAB — CBC
HCT: 37.8 % — ABNORMAL LOW (ref 39.0–52.0)
Hemoglobin: 11.6 g/dL — ABNORMAL LOW (ref 13.0–17.0)
MCH: 27.2 pg (ref 26.0–34.0)
MCHC: 30.7 g/dL (ref 30.0–36.0)
MCV: 88.5 fL (ref 80.0–100.0)
Platelets: 189 10*3/uL (ref 150–400)
RBC: 4.27 MIL/uL (ref 4.22–5.81)
RDW: 14.8 % (ref 11.5–15.5)
WBC: 10.3 10*3/uL (ref 4.0–10.5)
nRBC: 0 % (ref 0.0–0.2)

## 2019-07-08 MED ORDER — WARFARIN SODIUM 2.5 MG PO TABS
2.5000 mg | ORAL_TABLET | Freq: Once | ORAL | Status: AC
  Administered 2019-07-08: 2.5 mg via ORAL
  Filled 2019-07-08: qty 1

## 2019-07-08 MED ORDER — DEXTROSE 5 % IV SOLN: INTRAVENOUS | Status: DC

## 2019-07-08 MED ORDER — ASPIRIN 81 MG PO CHEW
81.0000 mg | CHEWABLE_TABLET | Freq: Every day | ORAL | Status: DC
  Administered 2019-07-08 – 2019-07-09 (×2): 81 mg via ORAL
  Filled 2019-07-08 (×2): qty 1

## 2019-07-08 NOTE — Care Management (Signed)
Late Entry: Pt accepted at both [facility] and [facility]. Both facilities can potentially offer a bed on Monday 3/29.

## 2019-07-08 NOTE — Progress Notes (Signed)
Patient Demographics:    Scott [name], is a 86 y.o. male, DOB - 07/20/1921, ZRA:[id number]  Admit date - 07/01/2019   Admitting Physician Neena Rhymes, MD  Outpatient Primary MD for the patient is Dettinger, Fransisca Kaufmann, MD  LOS - 7   Chief Complaint  Patient presents with  . Altered Mental Status        Subjective:    Scott [name] today has no  emesis,    --Intermittent hematuria -Patient drug wanted to have both of chocolate flavored Ensure for me with ice -More awake, able to answer simple questions -Agitated from time to time pending of telemetry monitor leads  Assessment  & Plan :    Principal Problem:   Sepsis due to other etiology [facility]) Active Problems:   UTI (urinary tract infection)   Paroxysmal atrial fibrillation (HCC)   Acute combined systolic (congestive) and diastolic (congestive) heart failure (HCC)   Benign localized hyperplasia of prostate with urinary obstruction   Chronic combined systolic and diastolic heart failure (HCC)   NICM (nonischemic cardiomyopathy)- EF 20-25% by Echo 04/12/13   Essential hypertension   Chronic kidney disease (CKD) stage G3a/A1, moderately decreased glomerular filtration rate (GFR) between 45-59 mL/min/1.73 square meter and albuminuria creatinine ratio less than 30 mg/g (HCC)   Goals of care, counseling/discussion   Palliative care encounter  Brief Summary:- 86 y.o. 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 fib, hypertension, chronic kidney disease stage III, COPD, bladder cancer, CAD, left AKA admitted with fevers and sepsis pathophysiology on 07/01/2019 with concerns about urinary source -Now with concerns about  Seizures -Awaiting SNF rehab   A/p 1)Sepsis of unknown source ---on admission pt met sepsis criteria with fevers, tachycardia , tachypnea , Leukocytosis and Hypoxia    -WBC is down to 10.9 from 14.3 however anticipate further rising WBC with initiation of steroid therapy for COPD -Patient was treated with Rocephin (started 07/01/19) through 07/03/19 --Rocephin was discontinued due to concerns about contribution towards possible seizures -UA was "suspicious" for possible UTI however patient has history of bladder cancer and BPH -Chest x-ray without pneumonia, however patient had wheezing and hypoxia consistent with COPD -blood and urine cultures NGTD  2)HFrEF--- patient with combined diastolic and systolic dysfunction CHF last known EF 35 to 40%, repeat echo pending--- chest x-ray without overt CHF exacerbation, BNP elevated at 1507 (current BNP level which are similar to patient's prior) -Patient with wheezing, shortness of breath and hypoxia--more consistent with COPD exacerbation rather CHF exacerbation -Troponins were elevated peaking at 2252--- trended down, -Patient with history of previous elevated troponins ---Repeat echocardiogram with similar EF to prior -Repeat chest x-ray on 07/03/2019 without acute findings  3)Acute COPD Exacerbation- no definite pneumonia,  - c/n  mucolytics, antibiotics (as above in # 1)  and bronchodilators as ordered, supplemental oxygen as ordered. --Okay to complete steroids as ordered  4)Possible Seizures-----suspect partial seizures, patient had multiple episodes of "unresponsiveness" concerning for seizures on 07/02/2019 and again on 07/03/2019----caregiver Scott [name] took several videos while patient was unresponsive ---My serial exam leaves me suspicious for possible seizures ---No jerky or grand mal type activity-   Had intermittent episodes of unresponsiveness followed by severe lethargy-suspect post ictal somnolence ---More  awake lately with episodes of agitation and restlessness -- stroke work-up including MRI brain, CT head and CTA head and neck essentially negative -EEG without acute findings --Patient received IV  Keppra on 07/03/2019 and again on 07/04/2019 -As per neurologist okay to stop Keppra -May use IV lorazepam as needed seizures -Changed gabapentin to 300 mg twice daily -Neurology consult from Dr. Merlene Laughter appreciated --Rocephin was discontinued due to concerns about contribution to was possible seizures -Repeat EEG on 07/06/2019 without acute findings  5)H/o PAFib--- on telemetry episodes of tachyarrhythmia --atrial and junctional tachycardia from time to time -Continue to keep magnesium and potassium in range --Coumadin therapy as below a #8 -Continue Coreg for rate control at 6.25 mg twice daily  6)CKD stage -  IIIb---- appears at baseline,  creatinine on admission= 2.5 , baseline creatinine =  2.19 (01/08/19)  , creatinine is now=  1.1, renally adjust medications, avoid nephrotoxic agents / dehydration  / hypotension -Renal function improving  7)H/o PAD/CAD--- stable, continue aspirin, continue Coreg, -continue simvastatin, continue isosorbide and hydralazine combo -- Coumadin therapy as in #8  8)History of DVT/PE --- PTA was on Coumadin therapy--- INR is is down to 2.3 --restarted Coumadin per pharmacy on 07/07/2019 -Intermittent hematuria noted but H&H is stable  9)Dementia without behavioral disturbances -Continue Aricept and Zoloft,   10)Acute hypoxic respiratory failure secondary to #1 and #3 above--continue supplemental oxygen  11)FEN--- oral intake is not great, sodium trending up due to free water deficit -We will give D5 water for 1 bag only  Disposition/Need for in-Hospital Stay- patient unable to be discharged at this time due to ------ no further significant seizures noted Awaiting SNF rehab ----anticipate discharge on 07/09/2019  code Status : DNR  Family Communication:   (---- Discussed with caregiver Scott [name] at bedside -Also discussed with patient's son   Consults  :  na  DVT Prophylaxis  : Coumadin therapy SCDs   Lab Results  Component Value Date   PLT  189 07/08/2019    Inpatient Medications  Scheduled Meds: . aspirin  81 mg Oral Daily  . carvedilol  6.25 mg Oral BID WC  . donepezil  10 mg Oral QHS  . febuxostat  40 mg Oral Daily  . fluticasone  1 spray Each Nare QHS  . folic acid  1 mg Oral Daily  . gabapentin  300 mg Oral BID  . guaiFENesin  600 mg Oral BID  . isosorbide dinitrate  20 mg Oral TID   And  . hydrALAZINE  37.5 mg Oral TID  . mouth rinse  15 mL Mouth Rinse BID  . mirabegron ER  25 mg Oral q morning - 10a  . pantoprazole  40 mg Oral Daily  . predniSONE  40 mg Oral Q breakfast  . sertraline  50 mg Oral q morning - 10a  . simvastatin  10 mg Oral q1800  . sodium chloride flush  3 mL Intravenous Q12H  . warfarin  2.5 mg Oral ONCE-1600  . Warfarin - Pharmacist Dosing Inpatient   Does not apply q1800   Continuous Infusions: . sodium chloride    . dextrose    . dextrose 5 % and 0.45% NaCl 50 mL/hr at 07/07/19 1853   PRN Meds:.sodium chloride, acetaminophen, HYDROcodone-acetaminophen, ipratropium-albuterol, LORazepam, ondansetron (ZOFRAN) IV, sodium chloride flush    Anti-infectives (From admission, onward)   Start     Dose/Rate Route Frequency Ordered Stop   07/02/19 1600  azithromycin (ZITHROMAX) tablet 250 mg  250 mg Oral Daily 07/01/19 1812 07/05/19 1658   07/02/19 1500  cefTRIAXone (ROCEPHIN) 1 g in sodium chloride 0.9 % 100 mL IVPB  Status:  Discontinued     1 g 200 mL/hr over 30 Minutes Intravenous Every 24 hours 07/01/19 1812 07/03/19 1812   07/01/19 1500  cefTRIAXone (ROCEPHIN) 1 g in sodium chloride 0.9 % 100 mL IVPB     1 g 200 mL/hr over 30 Minutes Intravenous  Once 07/01/19 1452 07/01/19 1549   07/01/19 1500  azithromycin (ZITHROMAX) 500 mg in sodium chloride 0.9 % 250 mL IVPB  Status:  Discontinued     500 mg 250 mL/hr over 60 Minutes Intravenous Every 24 hours 07/01/19 1452 07/01/19 2048       Objective:   Vitals:   07/07/19 1425 07/07/19 2058 07/08/19 0043 07/08/19 0447  BP: 137/60  130/67 (!) 153/78 (!) 164/73  Pulse: 76 82 68 61  Resp: 18 (!) 23 19   Temp: 98.5 F (36.9 C) 98.7 F (37.1 C) 98.7 F (37.1 C) 98.2 F (36.8 C)  TempSrc: Oral Oral Oral Oral  SpO2: 98% 92% 93% 98%  Weight:      Height:        Wt Readings from Last 3 Encounters:  07/07/19 70.7 kg  01/26/19 86.2 kg  12/26/18 86.2 kg     Intake/Output Summary (Last 24 hours) at 07/08/2019 1043 Last data filed at 07/08/2019 0900 Gross per 24 hour  Intake 1159.1 ml  Output 750 ml  Net 409.1 ml    Physical Exam Gen:- Awake Alert,  In no apparent distress HEENT:- Scott [name].AT, No sclera icterus Nose- [place] 2L/min Neck-Supple Neck,No JVD,.  Lungs-improving air movement, no wheezing CV- S1, S2 normal, irregularly irregular,  Abd-  +ve B.Sounds, Abd Soft, No tenderness, no CVA tenderness Extremity/Skin:- No  edema, Rt pedal pulses present, Lt AKA Psych-more awake, occasional episodes of restlessness and agitation neuro-generalized weakness, no new focal deficits, no tremors GU--- intermittent hematuria   Data Review:   Micro Results Recent Results (from the past 240 hour(s))  Blood Culture (routine x 2)     Status: None   Collection Time: 07/01/19  2:52 PM   Specimen: BLOOD RIGHT ARM  Result Value Ref Range Status   Specimen Description BLOOD RIGHT ARM  Final   Special Requests   Final    BOTTLES DRAWN AEROBIC AND ANAEROBIC Blood Culture results may not be optimal due to an inadequate volume of blood received in culture bottles   Culture   Final    NO GROWTH 5 DAYS Performed at [facility], [address], [place], [place] [address]    Report Status 07/06/2019 FINAL  Final  Blood Culture (routine x 2)     Status: None   Collection Time: 07/01/19  3:15 PM   Specimen: BLOOD LEFT ARM  Result Value Ref Range Status   Specimen Description BLOOD LEFT ARM  Final   Special Requests   Final    BOTTLES DRAWN AEROBIC AND ANAEROBIC Blood Culture results may not be optimal due to an inadequate volume of  blood received in culture bottles   Culture   Final    NO GROWTH 5 DAYS Performed at [facility], [address], [place], [place] [address]    Report Status 07/06/2019 FINAL  Final  SARS CORONAVIRUS 2 (TAT 6-24 HRS) Nasopharyngeal Nasopharyngeal Swab     Status: None   Collection Time: 07/01/19  4:21 PM   Specimen: Nasopharyngeal Swab  Result  Value Ref Range Status   SARS Coronavirus 2 NEGATIVE NEGATIVE Final    Comment: (NOTE) SARS-CoV-2 target nucleic acids are NOT DETECTED. The SARS-CoV-2 RNA is generally detectable in upper and lower respiratory specimens during the acute phase of infection. Negative results do not preclude SARS-CoV-2 infection, do not rule out co-infections with other pathogens, and should not be used as the sole basis for treatment or other patient management decisions. Negative results must be combined with clinical observations, patient history, and epidemiological information. The expected result is Negative. Fact Sheet for Patients: SugarRoll.be Fact Sheet for Healthcare Providers: https://www.woods-mathews.com/ This test is not yet approved or cleared by the [place] FDA and  has been authorized for detection and/or diagnosis of SARS-CoV-2 by FDA under an Emergency Use Authorization (EUA). This EUA will remain  in effect (meaning this test can be used) for the duration of the COVID-19 declaration under Section 56 4(b)(1) of the Act, 21 U.S.C. section 360bbb-3(b)(1), unless the authorization is terminated or revoked sooner. Performed at [facility], [place] [address], [place], [place] [address]   Urine culture     Status: None   Collection Time: 07/01/19 10:15 PM   Specimen: In/Out Cath Urine  Result Value Ref Range Status   Specimen Description   Final    IN/OUT CATH URINE Performed at [facility], [address] [place], [place] [address]    Special Requests   Final    NONE Performed at [facility], [address], [place], [place] [address]    Culture   Final    NO GROWTH Performed at [facility], [place] [address], [place], [place] [address]    Report Status 07/03/2019 FINAL  Final  MRSA PCR Screening     Status: None   Collection Time: 07/02/19  7:51 PM   Specimen: Nasal Mucosa; Nasopharyngeal  Result Value Ref Range Status   MRSA by PCR NEGATIVE NEGATIVE Final    Comment:        The GeneXpert MRSA Assay (FDA approved for NASAL specimens only), is one component of a comprehensive MRSA colonization surveillance program. It is not intended to diagnose MRSA infection nor to guide or monitor treatment for MRSA infections. Performed at [facility], [address], [place], [place] [address]     Radiology Reports CT ANGIO HEAD W OR WO CONTRAST  Result Date: 07/02/2019 CLINICAL DATA:  Initial evaluation for acute altered mental status. EXAM: CT ANGIOGRAPHY HEAD AND NECK CT PERFUSION BRAIN TECHNIQUE: Multidetector CT imaging of the head and neck was performed using the standard protocol during bolus administration of intravenous contrast. Multiplanar CT image reconstructions and MIPs were obtained to evaluate the vascular anatomy. Carotid stenosis measurements (when applicable) are obtained utilizing NASCET criteria, using the distal internal carotid diameter as the denominator. Multiphase CT imaging of the brain was performed following IV bolus contrast injection. Subsequent parametric perfusion maps were calculated using RAPID software. CONTRAST:  149m OMNIPAQUE IOHEXOL 350 MG/ML SOLN COMPARISON:  Prior head CT from earlier same day. FINDINGS: CTA NECK FINDINGS Aortic arch: Examination technically limited by timing of the contrast bolus, with poor opacification of the arterial system. Visualized aortic arch of normal caliber with normal branch pattern. Moderate atherosclerotic change about the arch and origin of the great vessels without hemodynamically significant  stenosis. Visualized subclavian arteries widely patent. Right carotid system: Right CCA tortuous proximally but is widely patent to the bifurcation without flow-limiting stenosis. Extensive bulky calcified plaque about the right  bifurcation/proximal right ICA with resultant severe stenosis of at least 75% by NASCET criteria. Right ICA otherwise patent distally to the skull base without stenosis, dissection, or occlusion. Left carotid system: Left CCA mildly tortuous but patent to the bifurcation without flow-limiting stenosis. Extensive bulky plaque about the left bifurcation/proximal left ICA with resultant severe stenosis of up to left ICA otherwise patent to the skull base without stenosis, dissection or occlusion. Approximately 80% by NASCET criteria. Vertebral arteries: Both vertebral arteries arise from the subclavian arteries. Atheromatous plaque at the origin of the vertebral arteries bilaterally with estimated 50-75% ostial stenosis bilaterally, left greater than right. Left vertebral artery slightly dominant. Vertebral arteries otherwise patent within the neck without stenosis, dissection, or occlusion. Skeleton: No acute osseous abnormality. No discrete or worrisome osseous lesions. Exaggeration of the normal thoracic kyphosis noted. Degenerative changes noted about the left TMJ. Other neck: No other acute soft tissue abnormality within the neck. No mass lesion or adenopathy. Upper chest: Mild scattered atelectatic changes noted within the visualized lungs. Underlying emphysema. Cardiomegaly with scattered coronary artery calcifications partially visualized. Visualized upper chest demonstrates no other acute finding. Review of the MIP images confirms the above findings CTA HEAD FINDINGS Anterior circulation: Examination of the intracranial circulation technically limited by timing of the contrast bolus. Petrous segments patent bilaterally. Extensive calcified plaque throughout the carotid siphons with  associated moderate to severe diffuse narrowing. ICA termini well perfused. A1 segments patent bilaterally. Normal anterior communicating artery. Anterior cerebral arteries patent to their distal aspects without stenosis. No M1 stenosis or occlusion. Normal MCA bifurcations. Distal MCA branches well perfused and symmetric. Posterior circulation: Mild nonstenotic plaque noted within the proximal left V4 segment without significant stenosis. Left vertebral otherwise widely patent to the vertebrobasilar junction. Focal plaque within the proximal right V4 segment with short-segment mild to moderate stenosis. Left PICA patent. Right PICA not well seen. Basilar patent to its distal aspect without stenosis. Superior cerebral arteries patent bilaterally. Both PCAs primarily supplied via the basilar. PCAs well perfused to their distal aspects without stenosis. Venous sinuses: Not well assessed due to timing of the contrast bolus. Anatomic variants: None significant. Review of the MIP images confirms the above findings CT Brain Perfusion Findings: ASPECTS: 10 CT perfusion portion of this exam failed due to poor timing of the contrast bolus. Study is nondiagnostic for evaluation of acute ischemia or perfusion abnormality. IMPRESSION: CTA HEAD AND NECK IMPRESSION: 1. Technically limited exam due to timing of the contrast bolus. 2. Negative CTA for emergent large vessel occlusion. 3. Heavy atheromatous plaque about the carotid bifurcations with associated stenoses of up to at least 75-80% by NASCET criteria bilaterally, left slightly worse than right. 4. Atheromatous plaque at the origins of both vertebral arteries with estimated moderate to severe 50-75% stenoses bilaterally. Left vertebral artery dominant. 5. Extensive calcified plaque throughout the carotid siphons with associated moderate to severe diffuse narrowing. 6. Aortic Atherosclerosis (ICD10-I70.0) and Emphysema (ICD10-J43.9). CT PERFUSION IMPRESSION: CT perfusion  portion of this exam failed due to poor timing of the contrast bolus. Examination is nondiagnostic for evaluation of possible ischemia or other perfusion abnormality. Electronically Signed   By: Jeannine Boga M.D.   On: 07/02/2019 20:27   DG Chest 2 View  Result Date: 07/03/2019 CLINICAL DATA:  Dyspnea EXAM: CHEST - 2 VIEW COMPARISON:  07/01/2019 FINDINGS: Frontal and lateral views of the chest demonstrate a stable cardiac silhouette. There is persistent central vascular congestion. Chronic background interstitial scarring. No airspace disease, effusion, or pneumothorax.  No acute bony abnormalities. IMPRESSION: 1. Chronic central vascular congestion. 2. Chronic interstitial lung disease. 3. No acute airspace disease. Electronically Signed   By: Randa Ngo M.D.   On: 07/03/2019 12:20   CT ANGIO NECK W OR WO CONTRAST  Result Date: 07/02/2019 CLINICAL DATA:  Initial evaluation for acute altered mental status. EXAM: CT ANGIOGRAPHY HEAD AND NECK CT PERFUSION BRAIN TECHNIQUE: Multidetector CT imaging of the head and neck was performed using the standard protocol during bolus administration of intravenous contrast. Multiplanar CT image reconstructions and MIPs were obtained to evaluate the vascular anatomy. Carotid stenosis measurements (when applicable) are obtained utilizing NASCET criteria, using the distal internal carotid diameter as the denominator. Multiphase CT imaging of the brain was performed following IV bolus contrast injection. Subsequent parametric perfusion maps were calculated using RAPID software. CONTRAST:  140m OMNIPAQUE IOHEXOL 350 MG/ML SOLN COMPARISON:  Prior head CT from earlier same day. FINDINGS: CTA NECK FINDINGS Aortic arch: Examination technically limited by timing of the contrast bolus, with poor opacification of the arterial system. Visualized aortic arch of normal caliber with normal branch pattern. Moderate atherosclerotic change about the arch and origin of the great  vessels without hemodynamically significant stenosis. Visualized subclavian arteries widely patent. Right carotid system: Right CCA tortuous proximally but is widely patent to the bifurcation without flow-limiting stenosis. Extensive bulky calcified plaque about the right bifurcation/proximal right ICA with resultant severe stenosis of at least 75% by NASCET criteria. Right ICA otherwise patent distally to the skull base without stenosis, dissection, or occlusion. Left carotid system: Left CCA mildly tortuous but patent to the bifurcation without flow-limiting stenosis. Extensive bulky plaque about the left bifurcation/proximal left ICA with resultant severe stenosis of up to left ICA otherwise patent to the skull base without stenosis, dissection or occlusion. Approximately 80% by NASCET criteria. Vertebral arteries: Both vertebral arteries arise from the subclavian arteries. Atheromatous plaque at the origin of the vertebral arteries bilaterally with estimated 50-75% ostial stenosis bilaterally, left greater than right. Left vertebral artery slightly dominant. Vertebral arteries otherwise patent within the neck without stenosis, dissection, or occlusion. Skeleton: No acute osseous abnormality. No discrete or worrisome osseous lesions. Exaggeration of the normal thoracic kyphosis noted. Degenerative changes noted about the left TMJ. Other neck: No other acute soft tissue abnormality within the neck. No mass lesion or adenopathy. Upper chest: Mild scattered atelectatic changes noted within the visualized lungs. Underlying emphysema. Cardiomegaly with scattered coronary artery calcifications partially visualized. Visualized upper chest demonstrates no other acute finding. Review of the MIP images confirms the above findings CTA HEAD FINDINGS Anterior circulation: Examination of the intracranial circulation technically limited by timing of the contrast bolus. Petrous segments patent bilaterally. Extensive calcified  plaque throughout the carotid siphons with associated moderate to severe diffuse narrowing. ICA termini well perfused. A1 segments patent bilaterally. Normal anterior communicating artery. Anterior cerebral arteries patent to their distal aspects without stenosis. No M1 stenosis or occlusion. Normal MCA bifurcations. Distal MCA branches well perfused and symmetric. Posterior circulation: Mild nonstenotic plaque noted within the proximal left V4 segment without significant stenosis. Left vertebral otherwise widely patent to the vertebrobasilar junction. Focal plaque within the proximal right V4 segment with short-segment mild to moderate stenosis. Left PICA patent. Right PICA not well seen. Basilar patent to its distal aspect without stenosis. Superior cerebral arteries patent bilaterally. Both PCAs primarily supplied via the basilar. PCAs well perfused to their distal aspects without stenosis. Venous sinuses: Not well assessed due to timing of the contrast bolus.  Anatomic variants: None significant. Review of the MIP images confirms the above findings CT Brain Perfusion Findings: ASPECTS: 10 CT perfusion portion of this exam failed due to poor timing of the contrast bolus. Study is nondiagnostic for evaluation of acute ischemia or perfusion abnormality. IMPRESSION: CTA HEAD AND NECK IMPRESSION: 1. Technically limited exam due to timing of the contrast bolus. 2. Negative CTA for emergent large vessel occlusion. 3. Heavy atheromatous plaque about the carotid bifurcations with associated stenoses of up to at least 75-80% by NASCET criteria bilaterally, left slightly worse than right. 4. Atheromatous plaque at the origins of both vertebral arteries with estimated moderate to severe 50-75% stenoses bilaterally. Left vertebral artery dominant. 5. Extensive calcified plaque throughout the carotid siphons with associated moderate to severe diffuse narrowing. 6. Aortic Atherosclerosis (ICD10-I70.0) and Emphysema  (ICD10-J43.9). CT PERFUSION IMPRESSION: CT perfusion portion of this exam failed due to poor timing of the contrast bolus. Examination is nondiagnostic for evaluation of possible ischemia or other perfusion abnormality. Electronically Signed   By: Jeannine Boga M.D.   On: 07/02/2019 20:27   MR BRAIN WO CONTRAST  Result Date: 07/03/2019 CLINICAL DATA:  86 year old male code stroke presentation yesterday. Negative large vessel occlusion on CTA. EXAM: MRI HEAD WITHOUT CONTRAST TECHNIQUE: Multiplanar, multiecho pulse sequences of the brain and surrounding structures were obtained without intravenous contrast. COMPARISON:  CT head and CTA head and neck yesterday. FINDINGS: Brain: No restricted diffusion or evidence of acute infarction. Chronic lacunar type infarct of the left corona radiata and extending to the left deep gray nuclei. Occasional superimposed small areas of chronic cortical encephalomalacia such as the right superior frontal gyrus on series 8, image 42, pre motor area. Scattered other white matter T2 and FLAIR hyperintensity, although generally mild for age. There are occasional chronic micro hemorrhages suspected (left periatrial white matter series 9, image 12). Brainstem and cerebellum appear largely negative. Cavum septum pellucidum, normal variant. No midline shift, mass effect, evidence of mass lesion, ventriculomegaly, extra-axial collection or acute intracranial hemorrhage. Cervicomedullary junction and pituitary are within normal limits. Vascular: Major intracranial vascular flow voids is are preserved. Skull and upper cervical spine: Degenerative ligamentous hypertrophy about the odontoid. Visualized bone marrow signal is within normal limits. Sinuses/Orbits: Postoperative changes to both globes, otherwise negative orbits. Paranasal sinus mucosal thickening is stable and most pronounced in the left maxillary sinus. Other: Mastoid air cells are well pneumatized. Limited detail of the  internal auditory structures. Scalp and face soft tissues appear grossly negative. IMPRESSION: 1. No acute intracranial abnormality. 2. Chronic small vessel ischemia, most pronounced in the left corona radiata. Electronically Signed   By: Genevie Ann M.D.   On: 07/03/2019 18:17   CT CEREBRAL PERFUSION W CONTRAST  Result Date: 07/02/2019 CLINICAL DATA:  Initial evaluation for acute altered mental status. EXAM: CT ANGIOGRAPHY HEAD AND NECK CT PERFUSION BRAIN TECHNIQUE: Multidetector CT imaging of the head and neck was performed using the standard protocol during bolus administration of intravenous contrast. Multiplanar CT image reconstructions and MIPs were obtained to evaluate the vascular anatomy. Carotid stenosis measurements (when applicable) are obtained utilizing NASCET criteria, using the distal internal carotid diameter as the denominator. Multiphase CT imaging of the brain was performed following IV bolus contrast injection. Subsequent parametric perfusion maps were calculated using RAPID software. CONTRAST:  150m OMNIPAQUE IOHEXOL 350 MG/ML SOLN COMPARISON:  Prior head CT from earlier same day. FINDINGS: CTA NECK FINDINGS Aortic arch: Examination technically limited by timing of the contrast bolus, with poor opacification  of the arterial system. Visualized aortic arch of normal caliber with normal branch pattern. Moderate atherosclerotic change about the arch and origin of the great vessels without hemodynamically significant stenosis. Visualized subclavian arteries widely patent. Right carotid system: Right CCA tortuous proximally but is widely patent to the bifurcation without flow-limiting stenosis. Extensive bulky calcified plaque about the right bifurcation/proximal right ICA with resultant severe stenosis of at least 75% by NASCET criteria. Right ICA otherwise patent distally to the skull base without stenosis, dissection, or occlusion. Left carotid system: Left CCA mildly tortuous but patent to the  bifurcation without flow-limiting stenosis. Extensive bulky plaque about the left bifurcation/proximal left ICA with resultant severe stenosis of up to left ICA otherwise patent to the skull base without stenosis, dissection or occlusion. Approximately 80% by NASCET criteria. Vertebral arteries: Both vertebral arteries arise from the subclavian arteries. Atheromatous plaque at the origin of the vertebral arteries bilaterally with estimated 50-75% ostial stenosis bilaterally, left greater than right. Left vertebral artery slightly dominant. Vertebral arteries otherwise patent within the neck without stenosis, dissection, or occlusion. Skeleton: No acute osseous abnormality. No discrete or worrisome osseous lesions. Exaggeration of the normal thoracic kyphosis noted. Degenerative changes noted about the left TMJ. Other neck: No other acute soft tissue abnormality within the neck. No mass lesion or adenopathy. Upper chest: Mild scattered atelectatic changes noted within the visualized lungs. Underlying emphysema. Cardiomegaly with scattered coronary artery calcifications partially visualized. Visualized upper chest demonstrates no other acute finding. Review of the MIP images confirms the above findings CTA HEAD FINDINGS Anterior circulation: Examination of the intracranial circulation technically limited by timing of the contrast bolus. Petrous segments patent bilaterally. Extensive calcified plaque throughout the carotid siphons with associated moderate to severe diffuse narrowing. ICA termini well perfused. A1 segments patent bilaterally. Normal anterior communicating artery. Anterior cerebral arteries patent to their distal aspects without stenosis. No M1 stenosis or occlusion. Normal MCA bifurcations. Distal MCA branches well perfused and symmetric. Posterior circulation: Mild nonstenotic plaque noted within the proximal left V4 segment without significant stenosis. Left vertebral otherwise widely patent to the  vertebrobasilar junction. Focal plaque within the proximal right V4 segment with short-segment mild to moderate stenosis. Left PICA patent. Right PICA not well seen. Basilar patent to its distal aspect without stenosis. Superior cerebral arteries patent bilaterally. Both PCAs primarily supplied via the basilar. PCAs well perfused to their distal aspects without stenosis. Venous sinuses: Not well assessed due to timing of the contrast bolus. Anatomic variants: None significant. Review of the MIP images confirms the above findings CT Brain Perfusion Findings: ASPECTS: 10 CT perfusion portion of this exam failed due to poor timing of the contrast bolus. Study is nondiagnostic for evaluation of acute ischemia or perfusion abnormality. IMPRESSION: CTA HEAD AND NECK IMPRESSION: 1. Technically limited exam due to timing of the contrast bolus. 2. Negative CTA for emergent large vessel occlusion. 3. Heavy atheromatous plaque about the carotid bifurcations with associated stenoses of up to at least 75-80% by NASCET criteria bilaterally, left slightly worse than right. 4. Atheromatous plaque at the origins of both vertebral arteries with estimated moderate to severe 50-75% stenoses bilaterally. Left vertebral artery dominant. 5. Extensive calcified plaque throughout the carotid siphons with associated moderate to severe diffuse narrowing. 6. Aortic Atherosclerosis (ICD10-I70.0) and Emphysema (ICD10-J43.9). CT PERFUSION IMPRESSION: CT perfusion portion of this exam failed due to poor timing of the contrast bolus. Examination is nondiagnostic for evaluation of possible ischemia or other perfusion abnormality. Electronically Signed   By: Marland Kitchen  Jeannine Boga M.D.   On: 07/02/2019 20:27   DG Chest Port 1 View  Result Date: 07/01/2019 CLINICAL DATA:  Shortness of breath. EXAM: PORTABLE CHEST 1 VIEW COMPARISON:  December 26, 2018. FINDINGS: Stable cardiomediastinal silhouette. No pneumothorax or pleural effusion is noted. Both  lungs are clear. The visualized skeletal structures are unremarkable. IMPRESSION: No active disease. Electronically Signed   By: Marijo Conception M.D.   On: 07/01/2019 15:15   EEG adult  Result Date: 07/06/2019 Phillips Odor, MD     07/06/2019  5:07 PM [place] A. Merlene Laughter, MD     [email or web address]       HISTORY: This is a 86 year old male who presents with unresponsiveness worrisome for nonconvulsive seizures. MEDICATIONS: Current Facility-Administered Medications: .  0.9 %  sodium chloride infusion, 250 mL, Intravenous, PRN, Norins, Heinz Knuckles, MD .  acetaminophen (TYLENOL) tablet 650 mg, 650 mg, Oral, Q6H PRN, Norins, Heinz Knuckles, MD, 650 mg at 07/02/19 1330 .  aspirin EC tablet 81 mg, 81 mg, Oral, Daily, Norins, Heinz Knuckles, MD, 81 mg at 07/06/19 0946 .  carvedilol (COREG) tablet 6.25 mg, 6.25 mg, Oral, BID WC, Norins, Heinz Knuckles, MD, 6.25 mg at 07/06/19 1659 .  dextrose 5 %-0.45 % sodium chloride infusion, , Intravenous, Continuous, Kyere, Belinda K, NP, Last Rate: 50 mL/hr at 07/06/19 0300, Rate Verify at 07/06/19 0300 .  donepezil (ARICEPT) tablet 10 mg, 10 mg, Oral, QHS, Norins, Heinz Knuckles, MD, 10 mg at 07/05/19 2145 .  febuxostat (ULORIC) tablet 40 mg, 40 mg, Oral, Daily, Norins, Heinz Knuckles, MD, 40 mg at 07/06/19 0945 .  fluticasone (FLONASE) 50 MCG/ACT nasal spray 1 spray, 1 spray, Each Nare, QHS, Norins, Heinz Knuckles, MD, 1 spray at 07/05/19 2147 .  folic acid (FOLVITE) tablet 1 mg, 1 mg, Oral, Daily, Norins, Heinz Knuckles, MD, 1 mg at 07/06/19 0945 .  gabapentin (NEURONTIN) capsule 300 mg, 300 mg, Oral, BID, [place], MD, 300 mg at 07/06/19 0945 .  guaiFENesin (MUCINEX) 12 hr tablet 600 mg, 600 mg, Oral, BID, Shalen Petrak, MD, 600 mg at 07/06/19 0945 .  isosorbide dinitrate (ISORDIL) tablet 20 mg, 20 mg, Oral, TID, 20 mg at 07/06/19 1659 **AND** hydrALAZINE (APRESOLINE) tablet 37.5 mg, 37.5 mg, Oral, TID, Norins, Heinz Knuckles, MD, 37.5 mg at 07/06/19 1659 .  HYDROcodone-acetaminophen  (NORCO/VICODIN) 5-325 MG per tablet 1 tablet, 1 tablet, Oral, BID PRN, Norins, Heinz Knuckles, MD .  ipratropium-albuterol (DUONEB) 0.5-2.5 (3) MG/3ML nebulizer solution 3 mL, 3 mL, Nebulization, Q4H PRN, Norins, Heinz Knuckles, MD, 3 mL at 07/06/19 0410 .  LORazepam (ATIVAN) injection 0.5 mg, 0.5 mg, Intravenous, Q6H PRN, Kassia Demarinis, MD, 0.5 mg at 07/05/19 2148 .  MEDLINE mouth rinse, 15 mL, Mouth Rinse, BID, Johnson, Clanford L, MD, 15 mL at 07/06/19 0946 .  methylPREDNISolone sodium succinate (SOLU-MEDROL) 125 mg/2 mL injection 60 mg, 60 mg, Intravenous, Q8H, Laelle Bridgett, MD, 60 mg at 07/06/19 1209 .  mirabegron ER (MYRBETRIQ) tablet 25 mg, 25 mg, Oral, q morning - 10a, Norins, Heinz Knuckles, MD, 25 mg at 07/06/19 0945 .  ondansetron (ZOFRAN) injection 4 mg, 4 mg, Intravenous, Q6H PRN, Norins, Heinz Knuckles, MD .  pantoprazole (PROTONIX) EC tablet 40 mg, 40 mg, Oral, Daily, Norins, Heinz Knuckles, MD, 40 mg at 07/06/19 0945 .  potassium chloride SA (KLOR-CON) CR tablet 10 mEq, 10 mEq, Oral, BID, Norins, Heinz Knuckles, MD, 10 mEq at 07/05/19 2145 .  [START ON 07/07/2019] predniSONE (DELTASONE) tablet 40 mg, 40 mg,  Oral, Q breakfast, Olive Zmuda, MD .  sertraline (ZOLOFT) tablet 50 mg, 50 mg, Oral, q morning - 10a, Norins, Heinz Knuckles, MD, 50 mg at 07/06/19 0945 .  simvastatin (ZOCOR) tablet 10 mg, 10 mg, Oral, q1800, Norins, Heinz Knuckles, MD, 10 mg at 07/05/19 1715 .  sodium chloride flush (NS) 0.9 % injection 3 mL, 3 mL, Intravenous, Q12H, Norins, Heinz Knuckles, MD, 3 mL at 07/05/19 2149 .  sodium chloride flush (NS) 0.9 % injection 3 mL, 3 mL, Intravenous, PRN, Norins, Heinz Knuckles, MD ANALYSIS: A 16 channel recording using standard 10 20 measurements is conducted for 26 minutes.  The background activity is significant for generalized slowing mostly in the six point five to there is beta activity observed in the frontal areas. Photic stimulation and hyperventilation are not conducted.  There are a couple of episodes of generalized  rhythmic delta slowing.  No focal slowing is noted.  No epileptiform activity is noted.  IMPRESSION: 1.  This recording of the awake and drowsy state shows mild global slowing.  No epileptiform activities are noted. Kofi A. Merlene Laughter, M.D. Diplomate, Tax adviser of Psychiatry and Neurology ( Neurology).   EEG adult  Result Date: 07/03/2019 Lora Havens, MD     07/03/2019  6:59 PM Patient Name: Scott [name] MRN: [id number] Epilepsy Attending: Lora Havens Referring Physician/Provider: Dr Roxan Hockey Date: 07/03/2019 Duration: 24.14 mins Patient history: 86 year old male with transient alteration of awareness.  EEG evaluate for seizures. Level of alertness: Awake, asleep AEDs during EEG study: Gabapentin Technical aspects: This EEG study was done with scalp electrodes positioned according to the 10-20 International system of electrode placement. Electrical activity was acquired at a sampling rate of 500Hz  and reviewed with a high frequency filter of 70Hz  and a low frequency filter of 1Hz . EEG data were recorded continuously and digitally stored. Description: No clear posterior dominant rhythm was seen. Sleep was characterized by vertex waves, maximal frontocentral region.  EEG showed continuous generalized 6-8 Hz theta-alpha activity.     Hyperventilation and photic stimulation were not performed.  Abnormality - Continuous slow, generalized IMPRESSION: This study is suggestive of mild to moderate diffuse encephalopathy. No seizures or epileptiform discharges were seen throughout the recording. Lora Havens   ECHOCARDIOGRAM COMPLETE  Result Date: 07/02/2019    ECHOCARDIOGRAM REPORT   Patient Name:   Scott [name] Blann Date of Exam: 07/02/2019 Medical Rec #:  [id number]     Height:       65.0 in Accession #:    [id number]    Weight:       147.9 lb Date of Birth:  1921/04/30      BSA:          1.740 m Patient Age:    89 years      BP:           130/56 mmHg Patient Gender: M             HR:           40  bpm. Exam Location:  [facility] Procedure: 2D Echo Indications:    CHF-Acute Systolic 263.78 / H88.50  History:        Patient has prior history of Echocardiogram examinations, most                 recent 12/11/2018. CAD and Previous Myocardial Infarction,                 Arrythmias:Atrial Fibrillation, Signs/Symptoms:Dyspnea;  Risk                 Factors:Dyslipidemia, Hypertension and Former Smoker. NICM ,                 Bradycardia.  Sonographer:    Leavy Cella RDCS (AE) Referring Phys: [place]  1. Left ventricular ejection fraction, by estimation, is 35 to 40%. The left ventricle has moderately decreased function. The left ventricle demonstrates regional wall motion abnormalities (see scoring diagram/findings for description). Left ventricular  diastolic parameters are indeterminate.  2. Right ventricular systolic function is moderately reduced. The right ventricular size is normal. There is moderately elevated pulmonary artery systolic pressure. The estimated right ventricular systolic pressure is 41.2 mmHg.  3. Left atrial size was mildly dilated.  4. The mitral valve is degenerative. Mild mitral valve regurgitation.  5. The aortic valve is tricuspid, moderately calcified with restricted motion of the left coronary cusp. Aortic valve regurgitation is not visualized.  6. The inferior vena cava is normal in size with greater than 50% respiratory variability, suggesting right atrial pressure of 3 mmHg. FINDINGS  Left Ventricle: Left ventricular ejection fraction, by estimation, is 35 to 40%. The left ventricle has moderately decreased function. The left ventricle demonstrates regional wall motion abnormalities. The left ventricular internal cavity size was normal in size. There is borderline left ventricular hypertrophy. Left ventricular diastolic parameters are indeterminate.  LV Wall Scoring: The basal inferior segment is akinetic. The mid inferior segment is hypokinetic. Right  Ventricle: The right ventricular size is normal. No increase in right ventricular wall thickness. Right ventricular systolic function is moderately reduced. There is moderately elevated pulmonary artery systolic pressure. The tricuspid regurgitant velocity is 3.66 m/s, and with an assumed right atrial pressure of 3 mmHg, the estimated right ventricular systolic pressure is 87.8 mmHg. Left Atrium: Left atrial size was mildly dilated. Right Atrium: Right atrial size was normal in size. Pericardium: There is no evidence of pericardial effusion. Mitral Valve: The mitral valve is degenerative in appearance. Moderate mitral annular calcification. Mild mitral valve regurgitation. Tricuspid Valve: The tricuspid valve is grossly normal. Tricuspid valve regurgitation is mild. Aortic Valve: The aortic valve is tricuspid. Aortic valve regurgitation is not visualized. Moderate aortic valve annular calcification. There is moderate calcification of the aortic valve. Pulmonic Valve: The pulmonic valve was not well visualized. Pulmonic valve regurgitation is not visualized. Aorta: The aortic root is normal in size and structure. Venous: The inferior vena cava is normal in size with greater than 50% respiratory variability, suggesting right atrial pressure of 3 mmHg. IAS/Shunts: No atrial level shunt detected by color flow Doppler.  LEFT VENTRICLE PLAX 2D LVIDd:         5.14 cm  Diastology LVIDs:         3.31 cm  LV e' lateral:   5.11 cm/s LV PW:         1.08 cm  LV E/e' lateral: 18.5 LV IVS:        1.06 cm  LV e' medial:    3.15 cm/s LVOT diam:     2.20 cm  LV E/e' medial:  29.9 LVOT Area:     3.80 cm  RIGHT VENTRICLE RV S prime:     8.05 cm/s TAPSE (M-mode): 1.2 cm LEFT ATRIUM             Index LA diam:        3.60 cm 2.07 cm/m LA Vol (A2C):  61.0 ml 35.05 ml/m LA Vol (A4C):   56.7 ml 32.58 ml/m LA Biplane Vol: 60.6 ml 34.82 ml/m   AORTA Ao Root diam: 3.30 cm MITRAL VALVE                TRICUSPID VALVE MV Area (PHT): 2.55 cm      TR Peak grad:   53.6 mmHg MV Decel Time: 297 msec     TR Vmax:        366.00 cm/s MV E velocity: 94.30 cm/s MV A velocity: 123.00 cm/s  SHUNTS MV E/A ratio:  0.77         Systemic Diam: 2.20 cm Rozann Lesches MD Electronically signed by Rozann Lesches MD Signature Date/Time: 07/02/2019/2:59:42 PM    Final    CT HEAD CODE STROKE WO CONTRAST`  Addendum Date: 07/02/2019   ADDENDUM REPORT: 07/02/2019 18:33 ADDENDUM: Study discussed by telephone with Dr. Roxan Hockey on 07/02/2019 at [place] hours. Electronically Signed   By: Genevie Ann M.D.   On: 07/02/2019 18:33   Result Date: 07/02/2019 CLINICAL DATA:  Code stroke. 86 year old male with sudden change in behavior, now mostly nonresponsive. EXAM: CT HEAD WITHOUT CONTRAST TECHNIQUE: Contiguous axial images were obtained from the base of the skull through the vertex without intravenous contrast. COMPARISON:  Head CT 12/21/2018. FINDINGS: Brain: Stable cerebral volume. No ventriculomegaly. Cavum septum pellucidum, normal variant. 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 Stable gray-white matter differentiation throughout the brain. No acute intracranial hemorrhage identified. No midline shift, mass effect, or evidence of intracranial mass lesion. No cortically based acute infarct identified. Vascular: Extensive Calcified atherosclerosis at the skull base.6 No suspicious intracranial vascular hyperdensity. [id number] Skull: No acute osseous abnormality identified. Sinuses/Orbits: Stable paranasal sinuses since last year. Mastoids remain clear. Other: No acute orbit or scalp soft tissue finding. ASPECTS [facility] Stroke Program Early CT Score) Total score (0-10 with 10 being normal): 10 IMPRESSION: 1. Stable non contrast CT appearance of the brain since 2020. No acute cortically based infarct or acute intracranial hemorrhage identified. ASPECTS 10. 2. Chronic small vessel ischemia in the left corona radiata and basal ganglia. Electronically  Signed: By: Genevie Ann M.D. On: 07/02/2019 18:27     CBC Recent Labs  Lab 07/01/19 1452 07/02/19 0443 07/02/19 1247 07/03/19 0523 07/05/19 0610 07/06/19 0526 07/08/19 0634  WBC 14.3*   < > 11.3* 10.9* 9.3 5.5 10.3  HGB 13.7   < > 12.3* 11.7* 12.1* 11.8* 11.6*  HCT 43.5   < > 39.5 37.9* 40.2 39.2 37.8*  PLT 158   < > 120* 119* 153 143* 189  MCV 87.7   < > 88.8 89.2 90.1 91.0 88.5  MCH 27.6   < > 27.6 27.5 27.1 27.4 27.2  MCHC 31.5   < > 31.1 30.9 30.1 30.1 30.7  RDW 15.0   < > 15.3 15.7* 15.4 15.5 14.8  LYMPHSABS 1.1  --  0.8  --   --   --   --   MONOABS 0.8  --  0.8  --   --   --   --   EOSABS 0.0  --  0.0  --   --   --   --   BASOSABS 0.0  --  0.0  --   --   --   --    < > = values in this interval not displayed.    Chemistries  Recent Labs  Lab 07/01/19 1452 07/01/19 1452 07/02/19 0443 07/02/19  1030 07/02/19 1247 07/03/19 0523 07/05/19 0610 07/06/19 0526 07/07/19 0220 07/08/19 0634  NA 141   < > 147*   < > 144 143 143 144  --  146*  K 3.9   < > 4.1   < > 4.4 4.3 4.6 4.4  --  4.1  CL 103   < > 108   < > 106 108 108 107  --  109  CO2 27   < > 27   < > 29 26 25 27   --  30  GLUCOSE 101*   < > 103*   < > 117* 142* 138* 93  --  101*  BUN 55*   < > 52*   < > 54* 55* 56* 48*  --  40*  CREATININE 2.50*   < > 2.22*   < > 2.26* 1.94* 1.37* 1.23  --  1.10  CALCIUM 9.5   < > 9.5   < > 9.1 8.8* 9.2 9.2  --  9.7  MG  --   --  2.4  --   --   --   --  2.4 2.5*  --   AST 24  --   --   --   --   --   --   --   --   --   ALT 16  --   --   --   --   --   --   --   --   --   ALKPHOS 157*  --   --   --   --   --   --   --   --   --   BILITOT 0.9  --   --   --   --   --   --   --   --   --    < > = values in this interval not displayed.  ------------------------------------------------------------------------------------------------------------------ No results for input(s): CHOL, HDL, LDLCALC, TRIG, CHOLHDL, LDLDIRECT in the last 72 hours.  Lab Results  Component Value Date    HGBA1C 5.8 (H) 11/28/2015   ------------------------------------------------------------------------------------------------------------------ No results for input(s): TSH, T4TOTAL, T3FREE, THYROIDAB in the last 72 hours.  Invalid input(s): FREET3 ------------------------------------------------------------------------------------------------------------------ No results for input(s): VITAMINB12, FOLATE, FERRITIN, TIBC, IRON, RETICCTPCT in the last 72 hours.  Coagulation profile Recent Labs  Lab 07/04/19 0526 07/05/19 0610 07/06/19 0526 07/07/19 0221 07/08/19 0634  INR 3.9* 3.8* 4.0* 2.7* 2.3*    No results for input(s): DDIMER in the last 72 hours.  Cardiac Enzymes No results for input(s): CKMB, TROPONINI, MYOGLOBIN in the last 168 hours.  Invalid input(s): CK ------------------------------------------------------------------------------------------------------------------    Component Value Date/Time   BNP 1,193.0 (H) 07/02/2019 1247    Roxan Hockey M.D on 07/08/2019 at 10:43 AM  Go to [email or web address] - for contact info  [facility] - Office  [phone]

## 2019-07-08 NOTE — Progress Notes (Signed)
ANTICOAGULATION CONSULT NOTE -   Pharmacy Consult for warfarin dosing  Indication: atrial fibrillation               Temp: 98.2 F (36.8 C) (03/28 0447) Temp Source: Oral (03/28 0447) BP: 164/73 (03/28 0447) Pulse Rate: 61 (03/28 0447)  Labs: Recent Labs    07/06/19 0526 07/07/19 0221 07/08/19 0634  HGB 11.8*  --  11.6*  HCT 39.2  --  37.8*  PLT 143*  --  189  LABPROT 39.3* 28.9* 25.3*  INR 4.0* 2.7* 2.3*  CREATININE 1.23  --  1.10    Estimated Creatinine Clearance: 34.9 mL/min (by C-G formula based on SCr of 1.1 mg/dL).    Goal of Therapy:  INR 2-3 Monitor platelets by anticoagulation protocol: Yes   Prior to Admission Warfarin Dosing:  Scott [name] takes 2.5mg  of warfarin every day of the week except on M and F, takes 5mg       Lab Results  Component Value Date   INR 2.3 (H) 07/08/2019   INR 2.7 (H) 07/07/2019   INR 4.0 (H) 07/06/2019   Assessment: Scott [name] a 86 y.o. male requires anticoagulation with warfarin for the indication of  atrial fibrillation. Warfarin will be initiated inpatient following pharmacy protocol per pharmacy consult. Patient most recent blood work is as follows: CBC Latest Ref Rng & Units 07/08/2019 07/06/2019 07/05/2019  WBC 4.0 - 10.5 K/uL 10.3 5.5 9.3  Hemoglobin 13.0 - 17.0 g/dL 11.6(L) 11.8(L) 12.1(L)  Hematocrit 39.0 - 52.0 % 37.8(L) 39.2 40.2  Platelets 150 - 400 K/uL 189 143(L) 153    07/08/19 INR: 2.3   Patient ate ~25% of meal    Plan: Repeat  home dose of warfarin 2.5mg  today for INR 2.3 Obtain CBC MWF with am labs   Obtain  INR daily Monitor for signs and symptoms of bleeding   Despina Pole, Pharm. D. Clinical Pharmacist 07/08/2019 8:34 AM

## 2019-07-08 NOTE — Progress Notes (Signed)
CSW attempting to contact Gerald Stabs, admissions coordinator for [facility] to discuss patient discharge.Chris's voicemail states that he is out of the office until Monday and for all admissions inquiries to contact [facility]: [phone]. CSW left VM for [facility] requesting call back.   TOC team will continue to follow patient for discharge related needs  [facility]  Clinical Social Worker  Ph: [phone]

## 2019-07-08 NOTE — Progress Notes (Signed)
Noted to have blood tinged urine with blood clots.  Notified MD via text page.  Will continue to monitor

## 2019-07-09 ENCOUNTER — Telehealth: Payer: Self-pay | Admitting: Family Medicine

## 2019-07-09 DIAGNOSIS — F0390 Unspecified dementia without behavioral disturbance: Secondary | ICD-10-CM | POA: Diagnosis not present

## 2019-07-09 DIAGNOSIS — Z7401 Bed confinement status: Secondary | ICD-10-CM | POA: Diagnosis not present

## 2019-07-09 DIAGNOSIS — Z86718 Personal history of other venous thrombosis and embolism: Secondary | ICD-10-CM | POA: Diagnosis not present

## 2019-07-09 DIAGNOSIS — R293 Abnormal posture: Secondary | ICD-10-CM | POA: Diagnosis not present

## 2019-07-09 DIAGNOSIS — J189 Pneumonia, unspecified organism: Secondary | ICD-10-CM | POA: Diagnosis not present

## 2019-07-09 DIAGNOSIS — Z515 Encounter for palliative care: Secondary | ICD-10-CM | POA: Diagnosis not present

## 2019-07-09 DIAGNOSIS — Z89612 Acquired absence of left leg above knee: Secondary | ICD-10-CM | POA: Diagnosis not present

## 2019-07-09 DIAGNOSIS — A4189 Other specified sepsis: Secondary | ICD-10-CM | POA: Diagnosis not present

## 2019-07-09 DIAGNOSIS — A419 Sepsis, unspecified organism: Principal | ICD-10-CM

## 2019-07-09 DIAGNOSIS — Z789 Other specified health status: Secondary | ICD-10-CM | POA: Diagnosis not present

## 2019-07-09 DIAGNOSIS — R41841 Cognitive communication deficit: Secondary | ICD-10-CM | POA: Diagnosis not present

## 2019-07-09 DIAGNOSIS — N401 Enlarged prostate with lower urinary tract symptoms: Secondary | ICD-10-CM | POA: Diagnosis not present

## 2019-07-09 DIAGNOSIS — I959 Hypotension, unspecified: Secondary | ICD-10-CM | POA: Diagnosis not present

## 2019-07-09 DIAGNOSIS — I10 Essential (primary) hypertension: Secondary | ICD-10-CM | POA: Diagnosis not present

## 2019-07-09 DIAGNOSIS — I2510 Atherosclerotic heart disease of native coronary artery without angina pectoris: Secondary | ICD-10-CM | POA: Diagnosis not present

## 2019-07-09 DIAGNOSIS — R1312 Dysphagia, oropharyngeal phase: Secondary | ICD-10-CM | POA: Diagnosis not present

## 2019-07-09 DIAGNOSIS — G894 Chronic pain syndrome: Secondary | ICD-10-CM | POA: Diagnosis not present

## 2019-07-09 DIAGNOSIS — N1831 Chronic kidney disease, stage 3a: Secondary | ICD-10-CM | POA: Diagnosis not present

## 2019-07-09 DIAGNOSIS — I5023 Acute on chronic systolic (congestive) heart failure: Secondary | ICD-10-CM | POA: Diagnosis not present

## 2019-07-09 DIAGNOSIS — F339 Major depressive disorder, recurrent, unspecified: Secondary | ICD-10-CM | POA: Diagnosis not present

## 2019-07-09 DIAGNOSIS — R4189 Other symptoms and signs involving cognitive functions and awareness: Secondary | ICD-10-CM | POA: Diagnosis not present

## 2019-07-09 DIAGNOSIS — F445 Conversion disorder with seizures or convulsions: Secondary | ICD-10-CM | POA: Diagnosis not present

## 2019-07-09 DIAGNOSIS — J439 Emphysema, unspecified: Secondary | ICD-10-CM | POA: Diagnosis not present

## 2019-07-09 DIAGNOSIS — I480 Paroxysmal atrial fibrillation: Secondary | ICD-10-CM | POA: Diagnosis not present

## 2019-07-09 DIAGNOSIS — J449 Chronic obstructive pulmonary disease, unspecified: Secondary | ICD-10-CM | POA: Diagnosis not present

## 2019-07-09 DIAGNOSIS — M109 Gout, unspecified: Secondary | ICD-10-CM | POA: Diagnosis not present

## 2019-07-09 DIAGNOSIS — R0902 Hypoxemia: Secondary | ICD-10-CM | POA: Diagnosis not present

## 2019-07-09 DIAGNOSIS — F0391 Unspecified dementia with behavioral disturbance: Secondary | ICD-10-CM | POA: Diagnosis not present

## 2019-07-09 DIAGNOSIS — Z8551 Personal history of malignant neoplasm of bladder: Secondary | ICD-10-CM | POA: Diagnosis not present

## 2019-07-09 DIAGNOSIS — R319 Hematuria, unspecified: Secondary | ICD-10-CM | POA: Diagnosis not present

## 2019-07-09 DIAGNOSIS — M6281 Muscle weakness (generalized): Secondary | ICD-10-CM | POA: Diagnosis not present

## 2019-07-09 DIAGNOSIS — I739 Peripheral vascular disease, unspecified: Secondary | ICD-10-CM | POA: Diagnosis not present

## 2019-07-09 DIAGNOSIS — K219 Gastro-esophageal reflux disease without esophagitis: Secondary | ICD-10-CM | POA: Diagnosis not present

## 2019-07-09 DIAGNOSIS — I5041 Acute combined systolic (congestive) and diastolic (congestive) heart failure: Secondary | ICD-10-CM | POA: Diagnosis not present

## 2019-07-09 LAB — BASIC METABOLIC PANEL
Anion gap: 8 (ref 5–15)
BUN: 42 mg/dL — ABNORMAL HIGH (ref 8–23)
CO2: 31 mmol/L (ref 22–32)
Calcium: 9.6 mg/dL (ref 8.9–10.3)
Chloride: 105 mmol/L (ref 98–111)
Creatinine, Ser: 1.17 mg/dL (ref 0.61–1.24)
GFR calc Af Amer: >60 mL/min (ref >60)
GFR calc non Af Amer: 53 mL/min — ABNORMAL LOW (ref >60)
Glucose, Bld: 89 mg/dL (ref 70–99)
Potassium: 4.5 mmol/L (ref 3.5–5.1)
Sodium: 144 mmol/L (ref 135–145)

## 2019-07-09 LAB — CBC
HCT: 36.2 % — ABNORMAL LOW (ref 39.0–52.0)
Hemoglobin: 11.1 g/dL — ABNORMAL LOW (ref 13.0–17.0)
MCH: 27.2 pg (ref 26.0–34.0)
MCHC: 30.7 g/dL (ref 30.0–36.0)
MCV: 88.7 fL (ref 80.0–100.0)
Platelets: 209 10*3/uL (ref 150–400)
RBC: 4.08 MIL/uL — ABNORMAL LOW (ref 4.22–5.81)
RDW: 14.6 % (ref 11.5–15.5)
WBC: 8.4 10*3/uL (ref 4.0–10.5)
nRBC: 0 % (ref 0.0–0.2)

## 2019-07-09 LAB — PROTIME-INR
INR: 1.9 — ABNORMAL HIGH (ref 0.8–1.2)
Prothrombin Time: 21.3 seconds — ABNORMAL HIGH (ref 11.4–15.2)

## 2019-07-09 LAB — GLUCOSE, CAPILLARY
Glucose-Capillary: 110 mg/dL — ABNORMAL HIGH (ref 70–99)
Glucose-Capillary: 124 mg/dL — ABNORMAL HIGH (ref 70–99)
Glucose-Capillary: 87 mg/dL (ref 70–99)

## 2019-07-09 MED ORDER — HYDROCODONE-ACETAMINOPHEN 5-325 MG PO TABS: 1.0000 | ORAL_TABLET | Freq: Two times a day (BID) | ORAL | 0 refills | Status: DC | PRN

## 2019-07-09 MED ORDER — WARFARIN SODIUM 5 MG PO TABS: 5.0000 mg | ORAL_TABLET | Freq: Once | ORAL | Status: DC

## 2019-07-09 MED ORDER — ALBUTEROL SULFATE (2.5 MG/3ML) 0.083% IN NEBU: 2.5000 mg | INHALATION_SOLUTION | Freq: Two times a day (BID) | RESPIRATORY_TRACT | 3 refills | Status: DC

## 2019-07-09 MED ORDER — COMBIVENT RESPIMAT 20-100 MCG/ACT IN AERS: 1.0000 | INHALATION_SPRAY | Freq: Four times a day (QID) | RESPIRATORY_TRACT | 0 refills | Status: DC | PRN

## 2019-07-09 MED ORDER — FUROSEMIDE 20 MG PO TABS: 20.0000 mg | ORAL_TABLET | Freq: Every morning | ORAL | 1 refills | Status: DC

## 2019-07-09 MED ORDER — ALBUTEROL SULFATE (2.5 MG/3ML) 0.083% IN NEBU: 2.5000 mg | INHALATION_SOLUTION | Freq: Four times a day (QID) | RESPIRATORY_TRACT | 12 refills | Status: DC | PRN

## 2019-07-09 MED ORDER — BIDIL 20-37.5 MG PO TABS: 1.0000 | ORAL_TABLET | Freq: Two times a day (BID) | ORAL | 3 refills | Status: DC

## 2019-07-09 MED ORDER — PREDNISONE 20 MG PO TABS: 20.0000 mg | ORAL_TABLET | Freq: Every day | ORAL | 0 refills | Status: DC

## 2019-07-09 MED ORDER — WARFARIN SODIUM 2.5 MG PO TABS: 2.5000 mg | ORAL_TABLET | Freq: Every evening | ORAL | 2 refills | Status: DC

## 2019-07-09 MED ORDER — ENSURE NUTRITION SHAKE PO LIQD: 1.0000 | Freq: Four times a day (QID) | ORAL | 3 refills | Status: DC

## 2019-07-09 MED ORDER — GABAPENTIN 400 MG PO CAPS: 400.0000 mg | ORAL_CAPSULE | Freq: Two times a day (BID) | ORAL | 5 refills | Status: DC

## 2019-07-09 MED ORDER — GUAIFENESIN 100 MG/5ML PO SOLN: 10.0000 mL | Freq: Three times a day (TID) | ORAL | 0 refills | Status: AC

## 2019-07-09 NOTE — TOC Transition Note (Signed)
Transition of Care [facility]) - CM/SW Discharge Note   Patient Details  Name: Scott [name] MRN: [id number] Date of Birth: 03-30-1922  Transition of Care [facility]) CM/SW Contact:  Nilton Lave, Chauncey Reading, RN Phone Number: 07/09/2019, 10:43 AM   Clinical Narrative:   Patient discharging to SNF today. Discussed only bed offer with son, he elects to accept bed offer with [facility] Discussed covid test with [facility], patient does not need another covid test to transfer, facility will do a covid test at facility when patient arrives.   EMS to transfer.   All questions answered.   No other TOC needs.   Vaughan Basta of [facility] aware of patient change of plans to go to SNF.     Final next level of care: Skilled Nursing Facility Barriers to Discharge: Barriers Resolved   Patient Goals and CMS Choice Patient states their goals for this hospitalization and ongoing recovery are:: son wants a couple of weeks rehab and then patient to come home CMS Medicare.gov Compare Post Acute Care list provided to:: Other (Comment Required)([place]) Choice offered to / list presented to : Adult Children  Discharge Placement              Patient chooses bed at: [facility] Patient to be transferred to facility by: EMS Name of family member notified: Denice Bors Patient and family notified of of transfer: 07/09/19        HH Arranged: RN, PT [place] Agency: [place] ([place]) Date [place]: 07/02/19 Time [place]: [place] Representative spoke with at [place]: Romualdo Bolk: [phone]  Social Determinants of Health (SDOH) Interventions     Readmission Risk Interventions Readmission Risk Prevention Plan 07/02/2019 12/11/2018 06/23/2018  Transportation Screening Complete Complete Complete  PCP or Specialist Appt within 3-5 Days - - Complete  HRI or Home Care Consult - - Not Complete  HRI or Home Care Consult comments - - patient declines  Social Work  Scientific laboratory technician for [place] Planning/Counseling - - Complete  Palliative Care Screening - - Not Applicable  Medication Review Press photographer) Complete Complete Complete  PCP or Specialist appointment within 3-5 days of discharge Not Complete Complete -  [place] or Home Care Consult Complete Not Complete -  [place] or Home Care Consult Pt Refusal Comments - not ready -  SW Recovery Care/Counseling Consult Complete Complete -  Palliative Care Screening Not Applicable Not Applicable -  [place] Not Complete Not Complete -  SNF Comments - 24/7 care -  Some recent data might be hidden

## 2019-07-09 NOTE — Telephone Encounter (Signed)
Debbie, from [employer], called to leave message for Dr Lajuana Ripple to give her a call back at her earliest convenience. Says Dr Darnell Level can call her at the office or on her cell phone @ [phone]. Says it is regarding the Socket Change appeal that Medicare is now requesting a phone discussion about.

## 2019-07-09 NOTE — Care Management Important Message (Signed)
Important Message  Patient Details  Name: Scott [name] MRN: [id number] Date of Birth: 02-07-1922   Medicare Important Message Given:  Yes     [name] 07/09/2019, 11:22 AM

## 2019-07-09 NOTE — Discharge Summary (Signed)
Scott [name], is a 86 y.o. male  DOB Oct 10, 1921  MRN [id number].  Admission date:  07/01/2019  Admitting Physician  Neena Rhymes, MD  Discharge Date:  07/09/2019   Primary MD  Dettinger, Fransisca Kaufmann, MD  Recommendations for primary care physician for things to follow:  1) patient is taking Coumadin/warfarin which is a blood thinner so Avoid ibuprofen/Advil/Aleve/Motrin/Goody Powders/Naproxen/BC powders/Meloxicam/Diclofenac/Indomethacin and other Nonsteroidal anti-inflammatory medications as these will make you more likely to bleed and can cause stomach ulcers, can also cause Kidney problems.   2) patient has episodes of intermittent usually self resolving hematuria--- history of bladder malignancy/BPH and on chronic anticoagulation with Coumadin/warfarin----patient and son does not desire to have patient transported to the hospital with each episode of hematuria unless it is persistent as patient's episodes of hematuria tends to resolve spontaneously within 24 hours usually --- Please call patient son/POA [name]--- to discuss if there is any need for possible transfer to the hospital for hematuria this time patient has hematuria  3) please give Ensure chocolate flavor 1 can 4 times a day  4) please repeat PT/INR every Wednesday and Saturday for the next couple weeks  5) please check CBC and BMP every Saturday  6)--Please see enclosed MOST form with limitations to treatment  Admission Diagnosis  Hypoxia [R09.02] CAP (community acquired pneumonia) [J18.9] Acute combined systolic (congestive) and diastolic (congestive) heart failure ([place]) [I50.41] Community acquired pneumonia, unspecified laterality [J18.9] Sepsis, due to unspecified organism, unspecified whether acute organ dysfunction present ([place]) [A41.9]  Discharge Diagnosis  Hypoxia [R09.02] CAP (community acquired pneumonia) [J18.9] Acute combined systolic  (congestive) and diastolic (congestive) heart failure ([place]) [I50.41] Community acquired pneumonia, unspecified laterality [J18.9] Sepsis, due to unspecified organism, unspecified whether acute organ dysfunction present ([place]) [A41.9]    Principal Problem:   Sepsis due to other etiology ([place]) Active Problems:   UTI (urinary tract infection)   Paroxysmal atrial fibrillation ([place])   Acute combined systolic (congestive) and diastolic (congestive) heart failure ([place])   Benign localized hyperplasia of prostate with urinary obstruction   Chronic combined systolic and diastolic heart failure (HCC)   NICM (nonischemic cardiomyopathy)- EF 20-25% by Echo 04/12/13   Essential hypertension   Chronic kidney disease (CKD) stage G3a/A1, moderately decreased glomerular filtration rate (GFR) between 45-59 mL/min/1.73 square meter and albuminuria creatinine ratio less than 30 mg/g ([place])   Goals of care, counseling/discussion   Palliative care encounter      Past Medical History:  Diagnosis Date  . Arthritis    "all over"  . Atrial fibrillation ([place])   . Benign localized prostatic hyperplasia with lower urinary tract symptoms (LUTS)   . Bradycardia 11/28/2015   Severe-HR in the 20s to 30s following intubation for urological procedure.  Marland Kitchen CAD- non obstructive disease by cath 3/14 cardiologist-  dr hochrein  . Chronic lower back pain   . COPD (chronic obstructive pulmonary disease) ([place])   . DDD (degenerative disc disease)   . Depression   . Dyspnea    w/ exertion and lying down (  raise head)  . ETOH abuse   . GERD (gastroesophageal reflux disease)   . Glaucoma   . Glaucoma, both eyes   . History of bladder cancer urologist-- dr Jeffie Pollock   first dx 2012--  s/p TURBT's  . History of DVT of lower extremity 03/2013   bilateral   . History of pulmonary embolus (PE) 04/2013  . HTN (hypertension)   . Hyperlipidemia   . Hypertension   . LBBB (left bundle branch block)    chronic  . Non-ischemic  cardiomyopathy ([place])    last echo 01-18-2017, ef 35-40%  . NSVT (nonsustained ventricular tachycardia) ([place])    a. NSVT 06/2012; NSVT also seen during 03/2013 adm. b. Med rx. Not candidate for ICD given adv age.  Marland Kitchen PAD (peripheral artery disease) ([place])   . 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 PVCs (premature ventricular contractions)   . PVD (peripheral vascular disease) (HCC)    Right ABI .75, Left .78 (2006)  . Syncope 06/2012   a. Felt to be postural syncope related to diuretics 06/2012.  Marland Kitchen Systolic and diastolic CHF, chronic (HCC) cardiologsit-  dr hochrein   a. NICM - patent cors 06/2012, EF 40% at that time. b. 03/2013 eval: EF 20-25%.  . Vision loss, left eye    due to glaucoma  . Wears glasses     Past Surgical History:  Procedure Laterality Date  . AMPUTATION Left 10/16/2014   Procedure: AMPUTATION ABOVE KNEE- LEFT;  Surgeon: Mal Misty, MD;  Location: [place];  Service: Vascular;  Laterality: Left;  . CARDIAC CATHETERIZATION  05-11-2004  DR [facility]   MINIMAL CORANARY PLAQUE/ NORMAL LVF/ EF 55%/  NON-OBSTRUCTIVE LAD 25%  . CARDIAC CATHETERIZATION  06/18/2012   dr hochrein   mild luminal irregularities of coronaries, ef 45-50%  . CARDIOVASCULAR STRESS TEST  10-15-2010   LOW RISK NUCLEAR STUDY/ NO EVIDENCE OF ISCHEMIA/ NORMAL EF  . CATARACT EXTRACTION W/ INTRAOCULAR LENS  IMPLANT, BILATERAL Bilateral   . CYSTOSCOPY  10/07/2011   Procedure: CYSTOSCOPY;  Surgeon: Malka So, MD;  Location: [facility];  Service: Urology;  Laterality: N/A;  . CYSTOSCOPY N/A 10/20/2018   Procedure: CYSTOSCOPY WITH FULGURATION OF PROSTATIC URETHRAL TUMOR;  Surgeon: [name], MD;  Location: [facility];  Service: Urology;  Laterality: N/A;  . CYSTOSCOPY WITH BIOPSY  03/14/2012   Procedure: CYSTOSCOPY WITH BIOPSY;  Surgeon: Malka So, MD;  Location: [facility];  Service: Urology;  Laterality:  N/A;  WITH FULGURATION  . CYSTOSCOPY WITH BIOPSY N/A 12/09/2016   Procedure: CYSTOSCOPY WITH BIOPSY AND FULGURATION;  Surgeon: [name], MD;  Location: [facility];  Service: Urology;  Laterality: N/A;  . CYSTOSCOPY WITH BIOPSY N/A 12/20/2017   Procedure: CYSTOSCOPY WITH BIOPSY WITH FULGURATION;  Surgeon: [name], MD;  Location: [facility];  Service: Urology;  Laterality: N/A;  . CYSTOSCOPY WITH BIOPSY N/A 06/09/2018   Procedure: CYSTOSCOPY WITH BIOPSY AND FULGURATION;  Surgeon: [name], MD;  Location: [facility];  Service: Urology;  Laterality: N/A;  . CYSTOSCOPY WITH FULGERATION N/A 01/20/2016   Procedure: CYSTOSCOPY, BIOPSY WITH FULGERATION OF URTHRAL TUMOR;  Surgeon: [name], MD;  Location[facility];  Service: Urology;  Laterality: N/A;  . CYSTOSCOPY WITH FULGERATION N/A 06/01/2016   Procedure: CYSTOSCOPY WITH FULGERATION urethral tumors and bladder neck;  Surgeon: [name], MD;  Location[facility];  Service: Urology;  Laterality: N/A;  .  SHOULDER SURGERY Left 1970's  . TONSILLECTOMY    . TRANSTHORACIC ECHOCARDIOGRAM  01-18-2017   dr hochrein   moderate LVH, ef 35-40%, diffuse hypokinesis/  trivial AR and MR/  mild TR  . TRANSURETHRAL RESECTION OF BLADDER TUMOR  10/07/2011   Procedure: TRANSURETHRAL RESECTION OF BLADDER TUMOR (TURBT);  Surgeon: Malka So, MD;  Location[facility];  Service: Urology;  Laterality: N/A;  . TRANSURETHRAL RESECTION OF BLADDER TUMOR N/A 07/10/2015   Procedure: TRANSURETHRAL RESECTION OF BLADDER TUMOR (TURBT), CYSTOSCOPY ;  Surgeon: [name], MD;  Location[facility];  Service: Urology;  Laterality: N/A;  . TRANSURETHRAL RESECTION OF PROSTATE  03/14/2012   Procedure: TRANSURETHRAL RESECTION OF THE PROSTATE WITH GYRUS INSTRUMENTS;  Surgeon: Malka So, MD;  Location[facility];  Service: Urology;  Laterality: N/A;     HPI  from the history and physical done on the day of admission:   Chief Complaint: Shortness of breath  HPI: Scott [name] is a 86  y.o. male with medical history significant of DNR/DNI, hx of a fib on coumadin, CAD s/p MI 2019, left BKA, diabetes,presented to the hospital with fever, lethargy, and shortness of breath. His caretaker at the bedside reports that for the past 3 days or so the patient has been complaining about shortness of breath. They have noted that his home O2 sat has been dropping as low as 72% on room air. Normally the patient is 93 to 94% on room air. They feel like he was improving with albuterol treatments at home, but today he maintained sats in the 80s. Patient never complained about chest pain. They noted that he might be febrile today. Patient himself appears extremely tired on exam and cannot provide me any other history.    ED Course: In ED tmax 102.7, BP stable. CXR - NAD, La revealed leukocytosis to 14.3 w/ 87segs/8/lymphs, 5 monos, K 3.9, Cr 2.5, BNP 1,507, Troponin #1 2.25, procalcitonin 2.07. Patient received first dose of rocephin and Azithromycin for possible CAP along with 1 L NS. He is referred to [facility] for admission and management of acute CHF and possible CAP  Review of Systems: As per HPI otherwise 10 point review of systems negative. Patient denies chest pain.    Hospital Course:    Brief Summary:- 86 y.o.malewith medical history significant ofchronic combined CHF, PE/DVT on chronic anticoagulation, paroxysmal A. fib, hypertension, chronic kidney disease stage III, COPD, bladder cancer, CAD, left AKA admitted with fevers and sepsis pathophysiology on 07/01/2019 with concerns about urinary source --No further seizures -Transfer to SNF rehab   A/p 1)Sepsis of unknown source ---on admission pt met sepsis criteria with fevers, tachycardia , tachypnea , Leukocytosis and Hypoxia  -WBC is down to 8.4 from 14.3  -Patient was treated with Rocephin (started 07/01/19) through 07/03/19 --Rocephin was discontinued due to concerns about contribution towards possible seizures -UA was  "suspicious" for possible UTI however patient has history of bladder cancer and BPH -Chest x-ray without pneumonia, however patient had wheezing and hypoxia consistent with COPD -blood and urine cultures NGTD  2)HFrEF--- patient with combined diastolic and systolic dysfunction CHF last known EF 35 to 40%, repeat echo pending--- chest x-ray without overt CHF exacerbation, BNP elevated at 1507 (current BNP level which are similar to patient's prior) -Patient had wheezing, shortness of breath and hypoxia--more consistent with COPD exacerbation rather CHF exacerbation -Troponins were elevated peaking at 2252--- trended down, -Patient with history of previous elevated troponins ---Repeat echocardiogram with similar  EF to prior -Repeat chest x-ray on 07/03/2019 without acute findings  3)Acute COPD Exacerbation- no definite pneumonia,  - c/n  mucolytics, antibiotics (as above in # 1)  and bronchodilators as ordered,  --Weaned off oxygen --Okay to complete steroids as ordered  4) Seizures-----suspect partial seizures, patient had multiple episodes of "unresponsiveness" concerning for seizures on 07/02/2019 and again on 07/03/2019----caregiver Remo Lipps took several videos while patient was unresponsive ---My serial exam leaves me suspicious for possible seizures ---No jerky or grand mal type activity-   Had intermittent episodes of unresponsiveness followed by severe lethargy-suspect post ictal somnolence =--Patient appears to be close to baseline at this time more awake, appropriate, coherent and interactive -- stroke work-up including MRI brain, CT head and CTA head and neck essentially negative -EEG without acute findings --Patient received IV Keppra on 07/03/2019 and again on 07/04/2019 -As per neurologist okay to stop Keppra -Changed gabapentin to 400 mg twice daily -Neurology consult from Dr. Merlene Laughter appreciated --Rocephin was discontinued due to concerns about contribution to was possible  seizures -Repeat EEG on 07/06/2019 without acute findings  5)H/o PAFib--- stable at this time, --Coumadin therapy as below a #8 -Continue Coreg for rate control at 6.25 mg twice daily  6)CKD stage -  IIIb---- appears at baseline,  creatinine on admission= 2.5 , baseline creatinine =  2.19 (01/08/19)  , creatinine is now=  1.1, renally adjust medications, avoid nephrotoxic agents / dehydration  / hypotension -Renal function much improved -Check BMP every Saturday for the next couple weeks  7)H/o PAD/CAD--- stable, continue aspirin, continue Coreg, -continue simvastatin, continue isosorbide and hydralazine combo -- Coumadin therapy as in #8  8)History of DVT/PE --- PTA was on Coumadin therapy--- --continue Coumadin therapy, recheck PT/INR on Wednesday, 07/11/2019 and again on Saturday, 07/14/2019 -Intermittent hematuria noted but H&H is stable -Check CBC every Saturday for the next couple of weeks  9)Dementia without behavioral disturbances -Continue Aricept and Zoloft,   10)Acute hypoxic respiratory failure secondary to #1 and #3 above----resolved  11)FEN--- oral intake is not great, please give Ensure 1 bottle chocolate flavored up to 4 times a day with ice -Please check BMP every Saturday  12)Social/Ethics--- patient is a DNR/DNI,  -patient has episodes of intermittent usually self resolving hematuria--- history of bladder malignancy/BPH and on chronic anticoagulation with Coumadin/warfarin----patient and son does not desire to have patient transported to the hospital with each episode of hematuria unless it is persistent as patient's episodes of hematuria tends to resolve spontaneously within 24 hours usually --- Please call patient son/POA [name]--- to discuss if there is any need for possible transfer to the hospital for hematuria this time patient has hematuria --Please see enclosed MOST form with limitations to treatment   --Disposition/Need for in-Hospital Stay- patient  unable to be discharged at this time due to ------ no further significant seizures noted Awaiting SNF rehab ----anticipate discharge on 07/09/2019  code Status : DNR  Family Communication:   (---- Discussed with caregiver [name] at bedside -Also discussed with patient's son   Consults  :  Neurology  DVT Prophylaxis  : Coumadin therapy SCDs   Discharge Condition: stable  Follow UP  Diet and Activity recommendation:  As advised  Discharge Instructions    Discharge Instructions    Diet - low sodium heart healthy   Complete by: As directed    Discharge instructions   Complete by: As directed    1) patient is taking Coumadin/warfarin which is a blood thinner so Avoid ibuprofen/Advil/Aleve/Motrin/Goody  Powders/Naproxen/BC powders/Meloxicam/Diclofenac/Indomethacin and other Nonsteroidal anti-inflammatory medications as these will make you more likely to bleed and can cause stomach ulcers, can also cause Kidney problems.   2) patient has episodes of intermittent usually self resolving hematuria--- history of bladder malignancy/BPH and on chronic anticoagulation with Coumadin/warfarin----patient and son does not desire to have patient transported to the hospital with each episode of hematuria unless it is persistent as patient's episodes of hematuria tends to resolve spontaneously within 24 hours usually --- Please call patient son/POA [name]--- to discuss if there is any need for possible transfer to the hospital for hematuria this time patient has hematuria  3) please give Ensure chocolate flavor 1 can 4 times a day  4) please repeat PT/INR every Wednesday and Saturday for the next couple weeks  5) please check CBC and BMP every Saturday  6)--Please see enclosed MOST form with limitations to treatment   Increase activity slowly   Complete by: As directed         Discharge Medications     Allergies as of 07/09/2019      Reactions   Rocephin [ceftriaxone Sodium In  Dextrose] Other (See Comments)   Recurrent seizures   Lipitor [atorvastatin] Other (See Comments)   Unknown rxn per pt      Medication List    STOP taking these medications   albuterol 108 (90 Base) MCG/ACT inhaler Commonly known as: VENTOLIN HFA Replaced by: albuterol (2.5 MG/3ML) 0.083% nebulizer solution You also have another medication with the same name that you need to continue taking as instructed.   aspirin 81 MG EC tablet   ipratropium 0.02 % nebulizer solution Commonly known as: ATROVENT     TAKE these medications   acetaminophen 325 MG tablet Commonly known as: TYLENOL Take 2 tablets (650 mg total) by mouth every 6 (six) hours as needed for mild pain (or Fever >/= 101).   albuterol (2.5 MG/3ML) 0.083% nebulizer solution Commonly known as: PROVENTIL Take 3 mLs (2.5 mg total) by nebulization every 6 (six) hours as needed for wheezing or shortness of breath. What changed: You were already taking a medication with the same name, and this prescription was added. Make sure you understand how and when to take each. Replaces: albuterol 108 (90 Base) MCG/ACT inhaler   albuterol (2.5 MG/3ML) 0.083% nebulizer solution Commonly known as: PROVENTIL Take 3 mLs (2.5 mg total) by nebulization every 6 (six) hours as needed for wheezing or shortness of breath. What changed: You were already taking a medication with the same name, and this prescription was added. Make sure you understand how and when to take each.   albuterol (2.5 MG/3ML) 0.083% nebulizer solution Commonly known as: PROVENTIL Take 3 mLs (2.5 mg total) by nebulization 2 (two) times daily. What changed:   how much to take  how to take this  when to take this  additional instructions  Another medication with the same name was removed. Continue taking this medication, and follow the directions you see here.   BiDil 20-37.5 MG tablet Generic drug: isosorbide-hydrALAZINE Take 1 tablet by mouth 2 (two) times  daily. What changed: when to take this   carvedilol 6.25 MG tablet Commonly known as: COREG TAKE  (1)  TABLET TWICE A DAY. What changed: See the new instructions.   Combivent Respimat 20-100 MCG/ACT Aers respimat Generic drug: Ipratropium-Albuterol Inhale 1 puff into the lungs every 6 (six) hours as needed for wheezing or shortness of breath (As needed for wheezing , cough or  shortness of breath). What changed: See the new instructions.   donepezil 10 MG tablet Commonly known as: ARICEPT TAKE ONE TABLET AT BEDTIME   Ensure Nutrition Shake Liqd Take 1 Bottle by mouth 4 (four) times daily. 1 bottle of chocolate flavored Ensure up to 4 times a day--give with ice-please   febuxostat 40 MG tablet Commonly known as: ULORIC TAKE 1 TABLET DAILY   fluticasone 50 MCG/ACT nasal spray Commonly known as: FLONASE Place 1 spray into both nostrils daily as needed for allergies or rhinitis. What changed: when to take this   folic acid 1 MG tablet Commonly known as: FOLVITE TAKE 1 TABLET DAILY   furosemide 20 MG tablet Commonly known as: LASIX Take 1 tablet (20 mg total) by mouth in the morning. What changed:   medication strength  how much to take  when to take this   gabapentin 400 MG capsule Commonly known as: NEURONTIN Take 1 capsule (400 mg total) by mouth 2 (two) times daily.   guaiFENesin 100 MG/5ML Soln Commonly known as: ROBITUSSIN Take 10 mLs (200 mg total) by mouth 3 (three) times daily for 10 days.   HYDROcodone-acetaminophen 5-325 MG tablet Commonly known as: NORCO/VICODIN Take 1 tablet by mouth every 12 (twelve) hours as needed for moderate pain. What changed: when to take this   Myrbetriq 25 MG Tb24 tablet Generic drug: mirabegron ER Take 25 mg by mouth every morning.   pantoprazole 40 MG tablet Commonly known as: PROTONIX TAKE 1 TABLET DAILY   predniSONE 20 MG tablet Commonly known as: Deltasone Take 1 tablet (20 mg total) by mouth daily with  breakfast.   sertraline 50 MG tablet Commonly known as: ZOLOFT TAKE 1 TABLET IN THE MORNING   simvastatin 10 MG tablet Commonly known as: ZOCOR TAKE 1 TABLET ONCE DAILY IN THE EVENING What changed: See the new instructions.   warfarin 2.5 MG tablet Commonly known as: COUMADIN Take as directed. If you are unsure how to take this medication, talk to your nurse or doctor. Original instructions: Take 1 tablet (2.5 mg total) by mouth every evening. What changed: See the new instructions.      Major procedures and Radiology Reports - PLEASE review detailed and final reports for all details, in brief -   CT ANGIO HEAD W OR WO CONTRAST  Result Date: 07/02/2019 CLINICAL DATA:  Initial evaluation for acute altered mental status. EXAM: CT ANGIOGRAPHY HEAD AND NECK CT PERFUSION BRAIN TECHNIQUE: Multidetector CT imaging of the head and neck was performed using the standard protocol during bolus administration of intravenous contrast. Multiplanar CT image reconstructions and MIPs were obtained to evaluate the vascular anatomy. Carotid stenosis measurements (when applicable) are obtained utilizing NASCET criteria, using the distal internal carotid diameter as the denominator. Multiphase CT imaging of the brain was performed following IV bolus contrast injection. Subsequent parametric perfusion maps were calculated using RAPID software. CONTRAST:  142m OMNIPAQUE IOHEXOL 350 MG/ML SOLN COMPARISON:  Prior head CT from earlier same day. FINDINGS: CTA NECK FINDINGS Aortic arch: Examination technically limited by timing of the contrast bolus, with poor opacification of the arterial system. Visualized aortic arch of normal caliber with normal branch pattern. Moderate atherosclerotic change about the arch and origin of the great vessels without hemodynamically significant stenosis. Visualized subclavian arteries widely patent. Right carotid system: Right CCA tortuous proximally but is widely patent to the  bifurcation without flow-limiting stenosis. Extensive bulky calcified plaque about the right bifurcation/proximal right ICA with resultant severe stenosis of at  least 75% by NASCET criteria. Right ICA otherwise patent distally to the skull base without stenosis, dissection, or occlusion. Left carotid system: Left CCA mildly tortuous but patent to the bifurcation without flow-limiting stenosis. Extensive bulky plaque about the left bifurcation/proximal left ICA with resultant severe stenosis of up to left ICA otherwise patent to the skull base without stenosis, dissection or occlusion. Approximately 80% by NASCET criteria. Vertebral arteries: Both vertebral arteries arise from the subclavian arteries. Atheromatous plaque at the origin of the vertebral arteries bilaterally with estimated 50-75% ostial stenosis bilaterally, left greater than right. Left vertebral artery slightly dominant. Vertebral arteries otherwise patent within the neck without stenosis, dissection, or occlusion. Skeleton: No acute osseous abnormality. No discrete or worrisome osseous lesions. Exaggeration of the normal thoracic kyphosis noted. Degenerative changes noted about the left TMJ. Other neck: No other acute soft tissue abnormality within the neck. No mass lesion or adenopathy. Upper chest: Mild scattered atelectatic changes noted within the visualized lungs. Underlying emphysema. Cardiomegaly with scattered coronary artery calcifications partially visualized. Visualized upper chest demonstrates no other acute finding. Review of the MIP images confirms the above findings CTA HEAD FINDINGS Anterior circulation: Examination of the intracranial circulation technically limited by timing of the contrast bolus. Petrous segments patent bilaterally. Extensive calcified plaque throughout the carotid siphons with associated moderate to severe diffuse narrowing. ICA termini well perfused. A1 segments patent bilaterally. Normal anterior communicating  artery. Anterior cerebral arteries patent to their distal aspects without stenosis. No M1 stenosis or occlusion. Normal MCA bifurcations. Distal MCA branches well perfused and symmetric. Posterior circulation: Mild nonstenotic plaque noted within the proximal left V4 segment without significant stenosis. Left vertebral otherwise widely patent to the vertebrobasilar junction. Focal plaque within the proximal right V4 segment with short-segment mild to moderate stenosis. Left PICA patent. Right PICA not well seen. Basilar patent to its distal aspect without stenosis. Superior cerebral arteries patent bilaterally. Both PCAs primarily supplied via the basilar. PCAs well perfused to their distal aspects without stenosis. Venous sinuses: Not well assessed due to timing of the contrast bolus. Anatomic variants: None significant. Review of the MIP images confirms the above findings CT Brain Perfusion Findings: ASPECTS: 10 CT perfusion portion of this exam failed due to poor timing of the contrast bolus. Study is nondiagnostic for evaluation of acute ischemia or perfusion abnormality. IMPRESSION: CTA HEAD AND NECK IMPRESSION: 1. Technically limited exam due to timing of the contrast bolus. 2. Negative CTA for emergent large vessel occlusion. 3. Heavy atheromatous plaque about the carotid bifurcations with associated stenoses of up to at least 75-80% by NASCET criteria bilaterally, left slightly worse than right. 4. Atheromatous plaque at the origins of both vertebral arteries with estimated moderate to severe 50-75% stenoses bilaterally. Left vertebral artery dominant. 5. Extensive calcified plaque throughout the carotid siphons with associated moderate to severe diffuse narrowing. 6. Aortic Atherosclerosis (ICD10-I70.0) and Emphysema (ICD10-J43.9). CT PERFUSION IMPRESSION: CT perfusion portion of this exam failed due to poor timing of the contrast bolus. Examination is nondiagnostic for evaluation of possible ischemia or  other perfusion abnormality. Electronically Signed   By: Jeannine Boga M.D.   On: 07/02/2019 20:27   DG Chest 2 View  Result Date: 07/03/2019 CLINICAL DATA:  Dyspnea EXAM: CHEST - 2 VIEW COMPARISON:  07/01/2019 FINDINGS: Frontal and lateral views of the chest demonstrate a stable cardiac silhouette. There is persistent central vascular congestion. Chronic background interstitial scarring. No airspace disease, effusion, or pneumothorax. No acute bony abnormalities. IMPRESSION: 1. Chronic central vascular  congestion. 2. Chronic interstitial lung disease. 3. No acute airspace disease. Electronically Signed   By: Randa Ngo M.D.   On: 07/03/2019 12:20   CT ANGIO NECK W OR WO CONTRAST  Result Date: 07/02/2019 CLINICAL DATA:  Initial evaluation for acute altered mental status. EXAM: CT ANGIOGRAPHY HEAD AND NECK CT PERFUSION BRAIN TECHNIQUE: Multidetector CT imaging of the head and neck was performed using the standard protocol during bolus administration of intravenous contrast. Multiplanar CT image reconstructions and MIPs were obtained to evaluate the vascular anatomy. Carotid stenosis measurements (when applicable) are obtained utilizing NASCET criteria, using the distal internal carotid diameter as the denominator. Multiphase CT imaging of the brain was performed following IV bolus contrast injection. Subsequent parametric perfusion maps were calculated using RAPID software. CONTRAST:  179m OMNIPAQUE IOHEXOL 350 MG/ML SOLN COMPARISON:  Prior head CT from earlier same day. FINDINGS: CTA NECK FINDINGS Aortic arch: Examination technically limited by timing of the contrast bolus, with poor opacification of the arterial system. Visualized aortic arch of normal caliber with normal branch pattern. Moderate atherosclerotic change about the arch and origin of the great vessels without hemodynamically significant stenosis. Visualized subclavian arteries widely patent. Right carotid system: Right CCA tortuous  proximally but is widely patent to the bifurcation without flow-limiting stenosis. Extensive bulky calcified plaque about the right bifurcation/proximal right ICA with resultant severe stenosis of at least 75% by NASCET criteria. Right ICA otherwise patent distally to the skull base without stenosis, dissection, or occlusion. Left carotid system: Left CCA mildly tortuous but patent to the bifurcation without flow-limiting stenosis. Extensive bulky plaque about the left bifurcation/proximal left ICA with resultant severe stenosis of up to left ICA otherwise patent to the skull base without stenosis, dissection or occlusion. Approximately 80% by NASCET criteria. Vertebral arteries: Both vertebral arteries arise from the subclavian arteries. Atheromatous plaque at the origin of the vertebral arteries bilaterally with estimated 50-75% ostial stenosis bilaterally, left greater than right. Left vertebral artery slightly dominant. Vertebral arteries otherwise patent within the neck without stenosis, dissection, or occlusion. Skeleton: No acute osseous abnormality. No discrete or worrisome osseous lesions. Exaggeration of the normal thoracic kyphosis noted. Degenerative changes noted about the left TMJ. Other neck: No other acute soft tissue abnormality within the neck. No mass lesion or adenopathy. Upper chest: Mild scattered atelectatic changes noted within the visualized lungs. Underlying emphysema. Cardiomegaly with scattered coronary artery calcifications partially visualized. Visualized upper chest demonstrates no other acute finding. Review of the MIP images confirms the above findings CTA HEAD FINDINGS Anterior circulation: Examination of the intracranial circulation technically limited by timing of the contrast bolus. Petrous segments patent bilaterally. Extensive calcified plaque throughout the carotid siphons with associated moderate to severe diffuse narrowing. ICA termini well perfused. A1 segments patent  bilaterally. Normal anterior communicating artery. Anterior cerebral arteries patent to their distal aspects without stenosis. No M1 stenosis or occlusion. Normal MCA bifurcations. Distal MCA branches well perfused and symmetric. Posterior circulation: Mild nonstenotic plaque noted within the proximal left V4 segment without significant stenosis. Left vertebral otherwise widely patent to the vertebrobasilar junction. Focal plaque within the proximal right V4 segment with short-segment mild to moderate stenosis. Left PICA patent. Right PICA not well seen. Basilar patent to its distal aspect without stenosis. Superior cerebral arteries patent bilaterally. Both PCAs primarily supplied via the basilar. PCAs well perfused to their distal aspects without stenosis. Venous sinuses: Not well assessed due to timing of the contrast bolus. Anatomic variants: None significant. Review of the MIP images  confirms the above findings CT Brain Perfusion Findings: ASPECTS: 10 CT perfusion portion of this exam failed due to poor timing of the contrast bolus. Study is nondiagnostic for evaluation of acute ischemia or perfusion abnormality. IMPRESSION: CTA HEAD AND NECK IMPRESSION: 1. Technically limited exam due to timing of the contrast bolus. 2. Negative CTA for emergent large vessel occlusion. 3. Heavy atheromatous plaque about the carotid bifurcations with associated stenoses of up to at least 75-80% by NASCET criteria bilaterally, left slightly worse than right. 4. Atheromatous plaque at the origins of both vertebral arteries with estimated moderate to severe 50-75% stenoses bilaterally. Left vertebral artery dominant. 5. Extensive calcified plaque throughout the carotid siphons with associated moderate to severe diffuse narrowing. 6. Aortic Atherosclerosis (ICD10-I70.0) and Emphysema (ICD10-J43.9). CT PERFUSION IMPRESSION: CT perfusion portion of this exam failed due to poor timing of the contrast bolus. Examination is  nondiagnostic for evaluation of possible ischemia or other perfusion abnormality. Electronically Signed   By: Jeannine Boga M.D.   On: 07/02/2019 20:27   MR BRAIN WO CONTRAST  Result Date: 07/03/2019 CLINICAL DATA:  86 year old male code stroke presentation yesterday. Negative large vessel occlusion on CTA. EXAM: MRI HEAD WITHOUT CONTRAST TECHNIQUE: Multiplanar, multiecho pulse sequences of the brain and surrounding structures were obtained without intravenous contrast. COMPARISON:  CT head and CTA head and neck yesterday. FINDINGS: Brain: No restricted diffusion or evidence of acute infarction. Chronic lacunar type infarct of the left corona radiata and extending to the left deep gray nuclei. Occasional superimposed small areas of chronic cortical encephalomalacia such as the right superior frontal gyrus on series 8, image 42, pre motor area. Scattered other white matter T2 and FLAIR hyperintensity, although generally mild for age. There are occasional chronic micro hemorrhages suspected (left periatrial white matter series 9, image 12). Brainstem and cerebellum appear largely negative. Cavum septum pellucidum, normal variant. No midline shift, mass effect, evidence of mass lesion, ventriculomegaly, extra-axial collection or acute intracranial hemorrhage. Cervicomedullary junction and pituitary are within normal limits. Vascular: Major intracranial vascular flow voids is are preserved. Skull and upper cervical spine: Degenerative ligamentous hypertrophy about the odontoid. Visualized bone marrow signal is within normal limits. Sinuses/Orbits: Postoperative changes to both globes, otherwise negative orbits. Paranasal sinus mucosal thickening is stable and most pronounced in the left maxillary sinus. Other: Mastoid air cells are well pneumatized. Limited detail of the internal auditory structures. Scalp and face soft tissues appear grossly negative. IMPRESSION: 1. No acute intracranial abnormality. 2.  Chronic small vessel ischemia, most pronounced in the left corona radiata. Electronically Signed   By: Genevie Ann M.D.   On: 07/03/2019 18:17   CT CEREBRAL PERFUSION W CONTRAST  Result Date: 07/02/2019 CLINICAL DATA:  Initial evaluation for acute altered mental status. EXAM: CT ANGIOGRAPHY HEAD AND NECK CT PERFUSION BRAIN TECHNIQUE: Multidetector CT imaging of the head and neck was performed using the standard protocol during bolus administration of intravenous contrast. Multiplanar CT image reconstructions and MIPs were obtained to evaluate the vascular anatomy. Carotid stenosis measurements (when applicable) are obtained utilizing NASCET criteria, using the distal internal carotid diameter as the denominator. Multiphase CT imaging of the brain was performed following IV bolus contrast injection. Subsequent parametric perfusion maps were calculated using RAPID software. CONTRAST:  17m OMNIPAQUE IOHEXOL 350 MG/ML SOLN COMPARISON:  Prior head CT from earlier same day. FINDINGS: CTA NECK FINDINGS Aortic arch: Examination technically limited by timing of the contrast bolus, with poor opacification of the arterial system. Visualized aortic arch of normal  caliber with normal branch pattern. Moderate atherosclerotic change about the arch and origin of the great vessels without hemodynamically significant stenosis. Visualized subclavian arteries widely patent. Right carotid system: Right CCA tortuous proximally but is widely patent to the bifurcation without flow-limiting stenosis. Extensive bulky calcified plaque about the right bifurcation/proximal right ICA with resultant severe stenosis of at least 75% by NASCET criteria. Right ICA otherwise patent distally to the skull base without stenosis, dissection, or occlusion. Left carotid system: Left CCA mildly tortuous but patent to the bifurcation without flow-limiting stenosis. Extensive bulky plaque about the left bifurcation/proximal left ICA with resultant severe  stenosis of up to left ICA otherwise patent to the skull base without stenosis, dissection or occlusion. Approximately 80% by NASCET criteria. Vertebral arteries: Both vertebral arteries arise from the subclavian arteries. Atheromatous plaque at the origin of the vertebral arteries bilaterally with estimated 50-75% ostial stenosis bilaterally, left greater than right. Left vertebral artery slightly dominant. Vertebral arteries otherwise patent within the neck without stenosis, dissection, or occlusion. Skeleton: No acute osseous abnormality. No discrete or worrisome osseous lesions. Exaggeration of the normal thoracic kyphosis noted. Degenerative changes noted about the left TMJ. Other neck: No other acute soft tissue abnormality within the neck. No mass lesion or adenopathy. Upper chest: Mild scattered atelectatic changes noted within the visualized lungs. Underlying emphysema. Cardiomegaly with scattered coronary artery calcifications partially visualized. Visualized upper chest demonstrates no other acute finding. Review of the MIP images confirms the above findings CTA HEAD FINDINGS Anterior circulation: Examination of the intracranial circulation technically limited by timing of the contrast bolus. Petrous segments patent bilaterally. Extensive calcified plaque throughout the carotid siphons with associated moderate to severe diffuse narrowing. ICA termini well perfused. A1 segments patent bilaterally. Normal anterior communicating artery. Anterior cerebral arteries patent to their distal aspects without stenosis. No M1 stenosis or occlusion. Normal MCA bifurcations. Distal MCA branches well perfused and symmetric. Posterior circulation: Mild nonstenotic plaque noted within the proximal left V4 segment without significant stenosis. Left vertebral otherwise widely patent to the vertebrobasilar junction. Focal plaque within the proximal right V4 segment with short-segment mild to moderate stenosis. Left PICA  patent. Right PICA not well seen. Basilar patent to its distal aspect without stenosis. Superior cerebral arteries patent bilaterally. Both PCAs primarily supplied via the basilar. PCAs well perfused to their distal aspects without stenosis. Venous sinuses: Not well assessed due to timing of the contrast bolus. Anatomic variants: None significant. Review of the MIP images confirms the above findings CT Brain Perfusion Findings: ASPECTS: 10 CT perfusion portion of this exam failed due to poor timing of the contrast bolus. Study is nondiagnostic for evaluation of acute ischemia or perfusion abnormality. IMPRESSION: CTA HEAD AND NECK IMPRESSION: 1. Technically limited exam due to timing of the contrast bolus. 2. Negative CTA for emergent large vessel occlusion. 3. Heavy atheromatous plaque about the carotid bifurcations with associated stenoses of up to at least 75-80% by NASCET criteria bilaterally, left slightly worse than right. 4. Atheromatous plaque at the origins of both vertebral arteries with estimated moderate to severe 50-75% stenoses bilaterally. Left vertebral artery dominant. 5. Extensive calcified plaque throughout the carotid siphons with associated moderate to severe diffuse narrowing. 6. Aortic Atherosclerosis (ICD10-I70.0) and Emphysema (ICD10-J43.9). CT PERFUSION IMPRESSION: CT perfusion portion of this exam failed due to poor timing of the contrast bolus. Examination is nondiagnostic for evaluation of possible ischemia or other perfusion abnormality. Electronically Signed   By: Jeannine Boga M.D.   On: 07/02/2019 20:27  DG Chest Port 1 View  Result Date: 07/01/2019 CLINICAL DATA:  Shortness of breath. EXAM: PORTABLE CHEST 1 VIEW COMPARISON:  December 26, 2018. FINDINGS: Stable cardiomediastinal silhouette. No pneumothorax or pleural effusion is noted. Both lungs are clear. The visualized skeletal structures are unremarkable. IMPRESSION: No active disease. Electronically Signed   By:  Marijo Conception M.D.   On: 07/01/2019 15:15   EEG adult  Result Date: 07/06/2019 Phillips Odor, MD     07/06/2019  5:07 PM Emmett A. Merlene Laughter, MD     [email or web address]       HISTORY: This is a 86 year old male who presents with unresponsiveness worrisome for nonconvulsive seizures. MEDICATIONS: Current Facility-Administered Medications: .  0.9 %  sodium chloride infusion, 250 mL, Intravenous, PRN, Norins, Heinz Knuckles, MD .  acetaminophen (TYLENOL) tablet 650 mg, 650 mg, Oral, Q6H PRN, Norins, Heinz Knuckles, MD, 650 mg at 07/02/19 1330 .  aspirin EC tablet 81 mg, 81 mg, Oral, Daily, Norins, Heinz Knuckles, MD, 81 mg at 07/06/19 0946 .  carvedilol (COREG) tablet 6.25 mg, 6.25 mg, Oral, BID WC, Norins, Heinz Knuckles, MD, 6.25 mg at 07/06/19 1659 .  dextrose 5 %-0.45 % sodium chloride infusion, , Intravenous, Continuous, Kyere, Belinda K, NP, Last Rate: 50 mL/hr at 07/06/19 0300, Rate Verify at 07/06/19 0300 .  donepezil (ARICEPT) tablet 10 mg, 10 mg, Oral, QHS, Norins, Heinz Knuckles, MD, 10 mg at 07/05/19 2145 .  febuxostat (ULORIC) tablet 40 mg, 40 mg, Oral, Daily, Norins, Heinz Knuckles, MD, 40 mg at 07/06/19 0945 .  fluticasone (FLONASE) 50 MCG/ACT nasal spray 1 spray, 1 spray, Each Nare, QHS, Norins, Heinz Knuckles, MD, 1 spray at 07/05/19 2147 .  folic acid (FOLVITE) tablet 1 mg, 1 mg, Oral, Daily, Norins, Heinz Knuckles, MD, 1 mg at 07/06/19 0945 .  gabapentin (NEURONTIN) capsule 300 mg, 300 mg, Oral, BID, Tawfiq Favila, MD, 300 mg at 07/06/19 0945 .  guaiFENesin (MUCINEX) 12 hr tablet 600 mg, 600 mg, Oral, BID, Rector Devonshire, MD, 600 mg at 07/06/19 0945 .  isosorbide dinitrate (ISORDIL) tablet 20 mg, 20 mg, Oral, TID, 20 mg at 07/06/19 1659 **AND** hydrALAZINE (APRESOLINE) tablet 37.5 mg, 37.5 mg, Oral, TID, Norins, Heinz Knuckles, MD, 37.5 mg at 07/06/19 1659 .  HYDROcodone-acetaminophen (NORCO/VICODIN) 5-325 MG per tablet 1 tablet, 1 tablet, Oral, BID PRN, Norins, Heinz Knuckles, MD .  ipratropium-albuterol (DUONEB)  0.5-2.5 (3) MG/3ML nebulizer solution 3 mL, 3 mL, Nebulization, Q4H PRN, Norins, Heinz Knuckles, MD, 3 mL at 07/06/19 0410 .  LORazepam (ATIVAN) injection 0.5 mg, 0.5 mg, Intravenous, Q6H PRN, Lavonia Eager, MD, 0.5 mg at 07/05/19 2148 .  MEDLINE mouth rinse, 15 mL, Mouth Rinse, BID, Johnson, Clanford L, MD, 15 mL at 07/06/19 0946 .  methylPREDNISolone sodium succinate (SOLU-MEDROL) 125 mg/2 mL injection 60 mg, 60 mg, Intravenous, Q8H, Manahil Vanzile, MD, 60 mg at 07/06/19 1209 .  mirabegron ER (MYRBETRIQ) tablet 25 mg, 25 mg, Oral, q morning - 10a, Norins, Heinz Knuckles, MD, 25 mg at 07/06/19 0945 .  ondansetron (ZOFRAN) injection 4 mg, 4 mg, Intravenous, Q6H PRN, Norins, Heinz Knuckles, MD .  pantoprazole (PROTONIX) EC tablet 40 mg, 40 mg, Oral, Daily, Norins, Heinz Knuckles, MD, 40 mg at 07/06/19 0945 .  potassium chloride SA (KLOR-CON) CR tablet 10 mEq, 10 mEq, Oral, BID, Norins, Heinz Knuckles, MD, 10 mEq at 07/05/19 2145 .  [START ON 07/07/2019] predniSONE (DELTASONE) tablet 40 mg, 40 mg, Oral, Q breakfast, Emryn Flanery, MD .  sertraline (  ZOLOFT) tablet 50 mg, 50 mg, Oral, q morning - 10a, Norins, Heinz Knuckles, MD, 50 mg at 07/06/19 0945 .  simvastatin (ZOCOR) tablet 10 mg, 10 mg, Oral, q1800, Norins, Heinz Knuckles, MD, 10 mg at 07/05/19 1715 .  sodium chloride flush (NS) 0.9 % injection 3 mL, 3 mL, Intravenous, Q12H, Norins, Heinz Knuckles, MD, 3 mL at 07/05/19 2149 .  sodium chloride flush (NS) 0.9 % injection 3 mL, 3 mL, Intravenous, PRN, Norins, Heinz Knuckles, MD ANALYSIS: A 16 channel recording using standard 10 20 measurements is conducted for 26 minutes.  The background activity is significant for generalized slowing mostly in the six point five to there is beta activity observed in the frontal areas. Photic stimulation and hyperventilation are not conducted.  There are a couple of episodes of generalized rhythmic delta slowing.  No focal slowing is noted.  No epileptiform activity is noted.  IMPRESSION: 1.  This recording of the  awake and drowsy state shows mild global slowing.  No epileptiform activities are noted. Kofi A. Merlene Laughter, M.D. Diplomate, Tax adviser of Psychiatry and Neurology ( Neurology).   EEG adult  Result Date: 07/03/2019 Lora Havens, MD     07/03/2019  6:59 PM Patient Name: CREEDENCE KUNESH MRN: [id number] Epilepsy Attending: Lora Havens Referring Physician/Provider: Dr Roxan Hockey Date: 07/03/2019 Duration: 24.14 mins Patient history: 86 year old male with transient alteration of awareness.  EEG evaluate for seizures. Level of alertness: Awake, asleep AEDs during EEG study: Gabapentin Technical aspects: This EEG study was done with scalp electrodes positioned according to the 10-20 International system of electrode placement. Electrical activity was acquired at a sampling rate of 500Hz  and reviewed with a high frequency filter of 70Hz  and a low frequency filter of 1Hz . EEG data were recorded continuously and digitally stored. Description: No clear posterior dominant rhythm was seen. Sleep was characterized by vertex waves, maximal frontocentral region.  EEG showed continuous generalized 6-8 Hz theta-alpha activity.     Hyperventilation and photic stimulation were not performed.  Abnormality - Continuous slow, generalized IMPRESSION: This study is suggestive of mild to moderate diffuse encephalopathy. No seizures or epileptiform discharges were seen throughout the recording. Lora Havens   ECHOCARDIOGRAM COMPLETE  Result Date: 07/02/2019    ECHOCARDIOGRAM REPORT   Patient Name:   JRU PENSE Ream Date of Exam: 07/02/2019 Medical Rec #:  [id number]     Height:       65.0 in Accession #:    [id number]    Weight:       147.9 lb Date of Birth:  1922/04/06      BSA:          1.740 m Patient Age:    89 years      BP:           130/56 mmHg Patient Gender: M             HR:           40 bpm. Exam Location:  [facility] Procedure: 2D Echo Indications:    CHF-Acute Systolic 784.78 / S12.82  History:        Patient  has prior history of Echocardiogram examinations, most                 recent 12/11/2018. CAD and Previous Myocardial Infarction,                 Arrythmias:Atrial Fibrillation, Signs/Symptoms:Dyspnea; Risk  Factors:Dyslipidemia, Hypertension and Former Smoker. NICM ,                 Bradycardia.  Sonographer:    Leavy Cella RDCS (AE) Referring Phys: [place]  1. Left ventricular ejection fraction, by estimation, is 35 to 40%. The left ventricle has moderately decreased function. The left ventricle demonstrates regional wall motion abnormalities (see scoring diagram/findings for description). Left ventricular  diastolic parameters are indeterminate.  2. Right ventricular systolic function is moderately reduced. The right ventricular size is normal. There is moderately elevated pulmonary artery systolic pressure. The estimated right ventricular systolic pressure is 37.1 mmHg.  3. Left atrial size was mildly dilated.  4. The mitral valve is degenerative. Mild mitral valve regurgitation.  5. The aortic valve is tricuspid, moderately calcified with restricted motion of the left coronary cusp. Aortic valve regurgitation is not visualized.  6. The inferior vena cava is normal in size with greater than 50% respiratory variability, suggesting right atrial pressure of 3 mmHg. FINDINGS  Left Ventricle: Left ventricular ejection fraction, by estimation, is 35 to 40%. The left ventricle has moderately decreased function. The left ventricle demonstrates regional wall motion abnormalities. The left ventricular internal cavity size was normal in size. There is borderline left ventricular hypertrophy. Left ventricular diastolic parameters are indeterminate.  LV Wall Scoring: The basal inferior segment is akinetic. The mid inferior segment is hypokinetic. Right Ventricle: The right ventricular size is normal. No increase in right ventricular wall thickness. Right ventricular systolic  function is moderately reduced. There is moderately elevated pulmonary artery systolic pressure. The tricuspid regurgitant velocity is 3.66 m/s, and with an assumed right atrial pressure of 3 mmHg, the estimated right ventricular systolic pressure is 69.6 mmHg. Left Atrium: Left atrial size was mildly dilated. Right Atrium: Right atrial size was normal in size. Pericardium: There is no evidence of pericardial effusion. Mitral Valve: The mitral valve is degenerative in appearance. Moderate mitral annular calcification. Mild mitral valve regurgitation. Tricuspid Valve: The tricuspid valve is grossly normal. Tricuspid valve regurgitation is mild. Aortic Valve: The aortic valve is tricuspid. Aortic valve regurgitation is not visualized. Moderate aortic valve annular calcification. There is moderate calcification of the aortic valve. Pulmonic Valve: The pulmonic valve was not well visualized. Pulmonic valve regurgitation is not visualized. Aorta: The aortic root is normal in size and structure. Venous: The inferior vena cava is normal in size with greater than 50% respiratory variability, suggesting right atrial pressure of 3 mmHg. IAS/Shunts: No atrial level shunt detected by color flow Doppler.  LEFT VENTRICLE PLAX 2D LVIDd:         5.14 cm  Diastology LVIDs:         3.31 cm  LV e' lateral:   5.11 cm/s LV PW:         1.08 cm  LV E/e' lateral: 18.5 LV IVS:        1.06 cm  LV e' medial:    3.15 cm/s LVOT diam:     2.20 cm  LV E/e' medial:  29.9 LVOT Area:     3.80 cm  RIGHT VENTRICLE RV S prime:     8.05 cm/s TAPSE (M-mode): 1.2 cm LEFT ATRIUM             Index LA diam:        3.60 cm 2.07 cm/m LA Vol (A2C):   61.0 ml 35.05 ml/m LA Vol (A4C):   56.7 ml 32.58 ml/m LA Biplane Vol: 60.6  ml 34.82 ml/m   AORTA Ao Root diam: 3.30 cm MITRAL VALVE                TRICUSPID VALVE MV Area (PHT): 2.55 cm     TR Peak grad:   53.6 mmHg MV Decel Time: 297 msec     TR Vmax:        366.00 cm/s MV E velocity: 94.30 cm/s MV A  velocity: 123.00 cm/s  SHUNTS MV E/A ratio:  0.77         Systemic Diam: 2.20 cm Rozann Lesches MD Electronically signed by Rozann Lesches MD Signature Date/Time: 07/02/2019/2:59:42 PM    Final    CT HEAD CODE STROKE WO CONTRAST`  Addendum Date: 07/02/2019   ADDENDUM REPORT: 07/02/2019 18:33 ADDENDUM: Study discussed by telephone with Dr. Roxan Hockey on 07/02/2019 at [place] hours. Electronically Signed   By: Genevie Ann M.D.   On: 07/02/2019 18:33   Result Date: 07/02/2019 CLINICAL DATA:  Code stroke. 86 year old male with sudden change in behavior, now mostly nonresponsive. EXAM: CT HEAD WITHOUT CONTRAST TECHNIQUE: Contiguous axial images were obtained from the base of the skull through the vertex without intravenous contrast. COMPARISON:  Head CT 12/21/2018. FINDINGS: Brain: Stable cerebral volume. No ventriculomegaly. Cavum septum pellucidum, normal variant. 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 Stable gray-white matter differentiation throughout the brain. No acute intracranial hemorrhage identified. No midline shift, mass effect, or evidence of intracranial mass lesion. No cortically based acute infarct identified. Vascular: Extensive Calcified atherosclerosis at the skull base.6 No suspicious intracranial vascular hyperdensity. [id number] Skull: No acute osseous abnormality identified. Sinuses/Orbits: Stable paranasal sinuses since last year. Mastoids remain clear. Other: No acute orbit or scalp soft tissue finding. ASPECTS [facility] Stroke Program Early CT Score) Total score (0-10 with 10 being normal): 10 IMPRESSION: 1. Stable non contrast CT appearance of the brain since 2020. No acute cortically based infarct or acute intracranial hemorrhage identified. ASPECTS 10. 2. Chronic small vessel ischemia in the left corona radiata and basal ganglia. Electronically Signed: By: Genevie Ann M.D. On: 07/02/2019 18:27   Micro Results   Recent Results (from the past 240 hour(s))  Blood  Culture (routine x 2)     Status: None   Collection Time: 07/01/19  2:52 PM   Specimen: BLOOD RIGHT ARM  Result Value Ref Range Status   Specimen Description BLOOD RIGHT ARM  Final   Special Requests   Final    BOTTLES DRAWN AEROBIC AND ANAEROBIC Blood Culture results may not be optimal due to an inadequate volume of blood received in culture bottles   Culture   Final    NO GROWTH 5 DAYS Performed at [facility], [address], [place], [place] [address]    Report Status 07/06/2019 FINAL  Final  Blood Culture (routine x 2)     Status: None   Collection Time: 07/01/19  3:15 PM   Specimen: BLOOD LEFT ARM  Result Value Ref Range Status   Specimen Description BLOOD LEFT ARM  Final   Special Requests   Final    BOTTLES DRAWN AEROBIC AND ANAEROBIC Blood Culture results may not be optimal due to an inadequate volume of blood received in culture bottles   Culture   Final    NO GROWTH 5 DAYS Performed at [facility], [address], [place], [place] [address]    Report Status 07/06/2019 FINAL  Final  SARS CORONAVIRUS 2 (TAT 6-24 HRS) Nasopharyngeal Nasopharyngeal Swab  Status: None   Collection Time: 07/01/19  4:21 PM   Specimen: Nasopharyngeal Swab  Result Value Ref Range Status   SARS Coronavirus 2 NEGATIVE NEGATIVE Final    Comment: (NOTE) SARS-CoV-2 target nucleic acids are NOT DETECTED. The SARS-CoV-2 RNA is generally detectable in upper and lower respiratory specimens during the acute phase of infection. Negative results do not preclude SARS-CoV-2 infection, do not rule out co-infections with other pathogens, and should not be used as the sole basis for treatment or other patient management decisions. Negative results must be combined with clinical observations, patient history, and epidemiological information. The expected result is Negative. Fact Sheet for Patients: SugarRoll.be Fact Sheet for Healthcare  Providers: https://www.woods-mathews.com/ This test is not yet approved or cleared by the [place] FDA and  has been authorized for detection and/or diagnosis of SARS-CoV-2 by FDA under an Emergency Use Authorization (EUA). This EUA will remain  in effect (meaning this test can be used) for the duration of the COVID-19 declaration under Section 56 4(b)(1) of the Act, 21 U.S.C. section 360bbb-3(b)(1), unless the authorization is terminated or revoked sooner. Performed at [facility], [place] [address], [place], [place] [address]   Urine culture     Status: None   Collection Time: 07/01/19 10:15 PM   Specimen: In/Out Cath Urine  Result Value Ref Range Status   Specimen Description   Final    IN/OUT CATH URINE Performed at [facility], [address] [place], [place] [address]    Special Requests   Final    NONE Performed at [facility], [address] [place], [place] [address]    Culture   Final    NO GROWTH Performed at [facility], [place] [address] [place], [place] [address]    Report Status 07/03/2019 FINAL  Final  MRSA PCR Screening     Status: None   Collection Time: 07/02/19  7:51 PM   Specimen: Nasal Mucosa; Nasopharyngeal  Result Value Ref Range Status   MRSA by PCR NEGATIVE NEGATIVE Final    Comment:        The GeneXpert MRSA Assay (FDA approved for NASAL specimens only), is one component of a comprehensive MRSA colonization surveillance program. It is not intended to diagnose MRSA infection nor to guide or monitor treatment for MRSA infections. Performed at [facility], [address] [place], [place] [address]        Today   Subjective    [name] today has no new complaints --A lot more awake, interactive, talking with son at bedside -No fever  Or chills   No Nausea, Vomiting or Diarrhea       Patient has been seen and examined prior to discharge   Objective   Blood pressure (!) 165/92, pulse 68, temperature  97.7 F (36.5 C), temperature source Oral, resp. rate 16, height 5' 5"  (1.651 m), weight 70.7 kg, SpO2 98 %.   Intake/Output Summary (Last 24 hours) at 07/09/2019 1212 Last data filed at 07/09/2019 0900 Gross per 24 hour  Intake 815.61 ml  Output 450 ml  Net 365.61 ml   Exam Gen:- Awake Alert,  In no apparent distress HEENT:- .AT, No sclera icterus Neck-Supple Neck,No JVD,.  Lungs-improving air movement, no wheezing CV- S1, S2 normal, irregularly irregular,  Abd-  +ve B.Sounds, Abd Soft, No tenderness, no CVA tenderness Extremity/Skin:- No  edema, Rt pedal pulses present, Lt AKA Psych-more awake, occasional episodes of restlessness and agitation neuro-generalized weakness, no new  focal deficits, no tremors GU--- intermittent hematuria--- usually self resolving   Data Review   CBC w Diff:  Lab Results  Component Value Date   WBC 8.4 07/09/2019   HGB 11.1 (L) 07/09/2019   HGB 13.4 01/30/2019   HGB 11.5 (L) 01/13/2011   HCT 36.2 (L) 07/09/2019   HCT 41.1 01/30/2019   HCT 33.7 (L) 01/13/2011   PLT 209 07/09/2019   PLT 180 01/30/2019   LYMPHOPCT 7 07/02/2019   LYMPHOPCT 13.8 (L) 01/13/2011   MONOPCT 7 07/02/2019   MONOPCT 11.5 01/13/2011   EOSPCT 0 07/02/2019   EOSPCT 1.0 01/13/2011   BASOPCT 0 07/02/2019   BASOPCT 0.6 01/13/2011    CMP:  Lab Results  Component Value Date   NA 144 07/09/2019   NA 147 (H) 01/30/2019   K 4.5 07/09/2019   CL 105 07/09/2019   CO2 31 07/09/2019   BUN 42 (H) 07/09/2019   BUN 29 01/30/2019   CREATININE 1.17 07/09/2019   CREATININE 0.91 11/02/2012   PROT 8.0 07/01/2019   PROT 6.8 01/30/2019   ALBUMIN 4.5 07/01/2019   ALBUMIN 4.2 01/30/2019   BILITOT 0.9 07/01/2019   BILITOT 0.4 01/30/2019   ALKPHOS 157 (H) 07/01/2019   AST 24 07/01/2019   ALT 16 07/01/2019  .   Total Discharge time is about 33 minutes  Roxan Hockey M.D on 07/09/2019 at 12:12 PM  Go to [email or web address] -  for contact info  [facility] - Office   [phone]

## 2019-07-09 NOTE — TOC Transition Note (Signed)
Transition of Care [facility]) - CM/SW Discharge Note   Patient Details  Name: Scott [name] MRN: [id number] Date of Birth: Apr 30, 1921  Transition of Care [facility]) CM/SW Contact:  Cyra Spader, Chauncey Reading, RN Phone Number: 07/09/2019, 10:53 AM  Readmission Risk Interventions Readmission Risk Prevention Plan 07/09/2019 07/02/2019 12/11/2018  Transportation Screening Complete Complete Complete  PCP or Specialist Appt within 3-5 Days - - -  HRI or [place] or Home Care Consult comments - - -  Social Work Consult for [place] - - -  Medication Review Press photographer) Complete Complete Complete  PCP or Specialist appointment within 3-5 days of discharge Complete Not Complete Complete  HRI or Home Care Consult Complete Complete Not Complete  HRI or Home Care Consult Pt Refusal Comments - - not ready  SW Recovery Care/Counseling Consult Complete Complete Complete  Palliative Care Screening Complete Not Applicable Not Applicable  Skilled Nursing Facility Complete Not Complete Not Complete  SNF Comments - - 24/7 care  Some recent data might be hidden

## 2019-07-09 NOTE — Progress Notes (Signed)
Report given to Carmell Austria, Therapist, sports at [facility] at this time.

## 2019-07-09 NOTE — Progress Notes (Signed)
ANTICOAGULATION CONSULT NOTE -   Pharmacy Consult for warfarin dosing  Indication: atrial fibrillation               Temp: 97.7 F (36.5 C) (03/29 0449) Temp Source: Oral (03/29 0449) BP: 165/92 (03/29 0449) Pulse Rate: 68 (03/29 0449)  Labs: Recent Labs    07/07/19 0221 07/08/19 0634 07/09/19 0758  HGB  --  11.6* 11.1*  HCT  --  37.8* 36.2*  PLT  --  189 209  LABPROT 28.9* 25.3* 21.3*  INR 2.7* 2.3* 1.9*  CREATININE  --  1.10 1.17    Estimated Creatinine Clearance: 32.9 mL/min (by C-G formula based on SCr of 1.17 mg/dL).    Goal of Therapy:  INR 2-3 Monitor platelets by anticoagulation protocol: Yes   Prior to Admission Warfarin Dosing:  Scott [name] takes 2.5mg  of warfarin every day of the week except on M and F, takes 5mg       Lab Results  Component Value Date   INR 1.9 (H) 07/09/2019   INR 2.3 (H) 07/08/2019   INR 2.7 (H) 07/07/2019   Assessment: Scott [name] a 86 y.o. male requires anticoagulation with warfarin for the indication of  atrial fibrillation. Warfarin will be initiated inpatient following pharmacy protocol per pharmacy consult. Patient most recent blood work is as follows: CBC Latest Ref Rng & Units 07/09/2019 07/08/2019 07/06/2019  WBC 4.0 - 10.5 K/uL 8.4 10.3 5.5  Hemoglobin 13.0 - 17.0 g/dL 11.1(L) 11.6(L) 11.8(L)  Hematocrit 39.0 - 52.0 % 36.2(L) 37.8(L) 39.2  Platelets 150 - 400 K/uL 209 189 143(L)    07/09/19 INR: 1.9   CBC low stable Patient ate ~50-75 % of meals yesterday   Plan: Give home dose of warfarin  5mg  today for INR 1.9 Obtain CBC MWF with am labs   Obtain  INR daily Monitor for signs and symptoms of bleeding   Despina Pole, Pharm. D. Clinical Pharmacist 07/09/2019 10:52 AM

## 2019-07-09 NOTE — Progress Notes (Signed)
Palliative:  HPI: 86 y.o. male  with past medical history of atrial fibrillation on Coumadin, combined systolic/diastolic CHF (EF 123456), CAD s/p MI 2019, CKD stage 3B, left BKA, h/o PE/DVT, PAD/PVD, diabetes, h/o bladder cancer, dementia, BPH, arthritis, left eye vision loss admitted on 07/01/2019 with low sats and lethargy likely secondary to UTI sepsis. Also treated for COPD exacerbation. Late 3/22 code stroke called due to acute mental status change noted by caregiver at bedside. Ultimately he was suspected to have partial seizures. Plans for transition to SNF rehab.   MOST completed: DNR, full treatment (no intubation), antibiotics as needed, IVF as needed, temporary trial of feeding tube. Also indicated to call son prior to proceeding with hospitalization with hematuria on MOST form. The concern is that Scott [name] is likely to have recurring mild hematuria that could lead to frequent rehospitalization. The hope is that the hematuria may be monitored for decreasing hematuria over ~24 hours and possible hemaglobin check at facility. Hope is to avoid hospitalization unless necessary as this could be difficult and burdening to Scott [name]. Plans for transition to SNF rehab.   Scott [name]'s main concern is that his father is also allowed aggressive intervention for potential reversible causes. He notes that his mother was given a 6 month prognosis and continued to live several years. Goal is to keep Scott [name] as good as we can for as long as we can.   All questions/concerns addressed. Emotional support provided. Discussed with son, Scott [name], and with Dr. Denton Brick.   Exam: Alert, fatigued. Mostly oriented. No distress. Breathing regular, unlabored. Abd flat. Generalized weakness.   Plan: - MOST completed and copy placed on electronic chart.  - Plans for SNF rehab today.   [place], NP Palliative Medicine Team Pager [phone] (Please see [email or web address] for schedule) Team Phone [phone]     Greater than 50%  of this time was spent counseling and coordinating care related to the above assessment and plan

## 2019-07-09 NOTE — Discharge Instructions (Signed)
1) patient is taking Coumadin/warfarin which is a blood thinner so Avoid ibuprofen/Advil/Aleve/Motrin/Goody Powders/Naproxen/BC powders/Meloxicam/Diclofenac/Indomethacin and other Nonsteroidal anti-inflammatory medications as these will make you more likely to bleed and can cause stomach ulcers, can also cause Kidney problems.   2) patient has episodes of intermittent usually self resolving hematuria--- history of bladder malignancy/BPH and on chronic anticoagulation with Coumadin/warfarin----patient and son does not desire to have patient transported to the hospital with each episode of hematuria unless it is persistent as patient's episodes of hematuria tends to resolve spontaneously within 24 hours usually --- Please call patient son/POA [name]--- to discuss if there is any need for possible transfer to the hospital for hematuria this time patient has hematuria  3) please give Ensure chocolate flavor 1 can 4 times a day  4) please repeat PT/INR every Wednesday and Saturday for the next couple weeks  5) please check CBC and BMP every Saturday  6)--Please see enclosed MOST form with limitations to treatment

## 2019-07-11 NOTE — Telephone Encounter (Signed)
Spoke to [place].  MD/ DO not needed for phone call.  Will utilize my 05/2018 addended note.

## 2019-07-12 DIAGNOSIS — J189 Pneumonia, unspecified organism: Secondary | ICD-10-CM | POA: Diagnosis not present

## 2019-07-12 DIAGNOSIS — I2510 Atherosclerotic heart disease of native coronary artery without angina pectoris: Secondary | ICD-10-CM | POA: Diagnosis not present

## 2019-07-12 DIAGNOSIS — I5023 Acute on chronic systolic (congestive) heart failure: Secondary | ICD-10-CM | POA: Diagnosis not present

## 2019-07-12 DIAGNOSIS — N1831 Chronic kidney disease, stage 3a: Secondary | ICD-10-CM | POA: Diagnosis not present

## 2019-07-13 DIAGNOSIS — R4189 Other symptoms and signs involving cognitive functions and awareness: Secondary | ICD-10-CM | POA: Diagnosis not present

## 2019-07-13 DIAGNOSIS — J189 Pneumonia, unspecified organism: Secondary | ICD-10-CM | POA: Diagnosis not present

## 2019-07-13 DIAGNOSIS — I739 Peripheral vascular disease, unspecified: Secondary | ICD-10-CM | POA: Diagnosis not present

## 2019-07-13 DIAGNOSIS — I5023 Acute on chronic systolic (congestive) heart failure: Secondary | ICD-10-CM | POA: Diagnosis not present

## 2019-07-17 ENCOUNTER — Encounter: Payer: Self-pay | Admitting: Family Medicine

## 2019-07-24 ENCOUNTER — Other Ambulatory Visit: Payer: Self-pay | Admitting: Family Medicine

## 2019-07-25 ENCOUNTER — Encounter: Payer: Self-pay | Admitting: Family Medicine

## 2019-07-26 ENCOUNTER — Telehealth: Payer: Self-pay | Admitting: Family Medicine

## 2019-07-26 ENCOUNTER — Other Ambulatory Visit: Payer: Self-pay | Admitting: Family Medicine

## 2019-07-26 ENCOUNTER — Other Ambulatory Visit: Payer: Self-pay | Admitting: *Deleted

## 2019-07-26 NOTE — Telephone Encounter (Signed)
Appt made

## 2019-07-26 NOTE — Patient Outreach (Signed)
Member screened for potential [facility] Care Management needs as a benefit of [place] [employer].  Mr. Statt is transitioning to home today 07/26/19 from [facility].  Telephone call made to member's nephew [name] [phone]. Patient identifiers confirmed. Remo Lipps states member has 24/7 caregivers and that he assists in his care. States Mr. Pavon does well at home and will have [facility] for home health.  Made Remo Lipps aware that member's PCP has an embedded [facility] Care Management team. Will update [facility] ECM of Mr. Mackert's dc from [facility] today.    Noted palliative care consult was done during most recent hospitalization.  Will update [facility] ECM team.   Marthenia Rolling, MSN-Ed, RN,BSN [place] Acute Care Coordinator [phone] [facility]) [phone]  (Toll free office)

## 2019-07-27 ENCOUNTER — Ambulatory Visit: Payer: BC Managed Care – PPO | Admitting: Urology

## 2019-07-27 DIAGNOSIS — I70209 Unspecified atherosclerosis of native arteries of extremities, unspecified extremity: Secondary | ICD-10-CM | POA: Diagnosis not present

## 2019-07-27 DIAGNOSIS — I5042 Chronic combined systolic (congestive) and diastolic (congestive) heart failure: Secondary | ICD-10-CM | POA: Diagnosis not present

## 2019-07-27 DIAGNOSIS — I130 Hypertensive heart and chronic kidney disease with heart failure and stage 1 through stage 4 chronic kidney disease, or unspecified chronic kidney disease: Secondary | ICD-10-CM | POA: Diagnosis not present

## 2019-07-27 DIAGNOSIS — I739 Peripheral vascular disease, unspecified: Secondary | ICD-10-CM | POA: Diagnosis not present

## 2019-07-27 DIAGNOSIS — I429 Cardiomyopathy, unspecified: Secondary | ICD-10-CM | POA: Diagnosis not present

## 2019-07-27 DIAGNOSIS — J440 Chronic obstructive pulmonary disease with acute lower respiratory infection: Secondary | ICD-10-CM | POA: Diagnosis not present

## 2019-07-27 DIAGNOSIS — I447 Left bundle-branch block, unspecified: Secondary | ICD-10-CM | POA: Diagnosis not present

## 2019-07-27 DIAGNOSIS — N1831 Chronic kidney disease, stage 3a: Secondary | ICD-10-CM | POA: Diagnosis not present

## 2019-07-27 DIAGNOSIS — J189 Pneumonia, unspecified organism: Secondary | ICD-10-CM | POA: Diagnosis not present

## 2019-07-27 DIAGNOSIS — I2510 Atherosclerotic heart disease of native coronary artery without angina pectoris: Secondary | ICD-10-CM | POA: Diagnosis not present

## 2019-07-27 DIAGNOSIS — I4811 Longstanding persistent atrial fibrillation: Secondary | ICD-10-CM | POA: Diagnosis not present

## 2019-07-28 DIAGNOSIS — J440 Chronic obstructive pulmonary disease with acute lower respiratory infection: Secondary | ICD-10-CM | POA: Diagnosis not present

## 2019-07-28 DIAGNOSIS — I130 Hypertensive heart and chronic kidney disease with heart failure and stage 1 through stage 4 chronic kidney disease, or unspecified chronic kidney disease: Secondary | ICD-10-CM | POA: Diagnosis not present

## 2019-07-28 DIAGNOSIS — I5042 Chronic combined systolic (congestive) and diastolic (congestive) heart failure: Secondary | ICD-10-CM | POA: Diagnosis not present

## 2019-07-28 DIAGNOSIS — I70209 Unspecified atherosclerosis of native arteries of extremities, unspecified extremity: Secondary | ICD-10-CM | POA: Diagnosis not present

## 2019-07-28 DIAGNOSIS — N1831 Chronic kidney disease, stage 3a: Secondary | ICD-10-CM | POA: Diagnosis not present

## 2019-07-28 DIAGNOSIS — I4811 Longstanding persistent atrial fibrillation: Secondary | ICD-10-CM | POA: Diagnosis not present

## 2019-07-30 ENCOUNTER — Telehealth: Payer: Self-pay | Admitting: *Deleted

## 2019-07-30 DIAGNOSIS — I4891 Unspecified atrial fibrillation: Secondary | ICD-10-CM

## 2019-07-30 DIAGNOSIS — N1831 Chronic kidney disease, stage 3a: Secondary | ICD-10-CM | POA: Diagnosis not present

## 2019-07-30 DIAGNOSIS — I70209 Unspecified atherosclerosis of native arteries of extremities, unspecified extremity: Secondary | ICD-10-CM | POA: Diagnosis not present

## 2019-07-30 DIAGNOSIS — J440 Chronic obstructive pulmonary disease with acute lower respiratory infection: Secondary | ICD-10-CM | POA: Diagnosis not present

## 2019-07-30 DIAGNOSIS — I5042 Chronic combined systolic (congestive) and diastolic (congestive) heart failure: Secondary | ICD-10-CM | POA: Diagnosis not present

## 2019-07-30 DIAGNOSIS — Z86711 Personal history of pulmonary embolism: Secondary | ICD-10-CM

## 2019-07-30 DIAGNOSIS — I130 Hypertensive heart and chronic kidney disease with heart failure and stage 1 through stage 4 chronic kidney disease, or unspecified chronic kidney disease: Secondary | ICD-10-CM | POA: Diagnosis not present

## 2019-07-30 DIAGNOSIS — I4811 Longstanding persistent atrial fibrillation: Secondary | ICD-10-CM | POA: Diagnosis not present

## 2019-07-30 NOTE — Telephone Encounter (Signed)
Description   Continue to take 7.5 mg on Tuesday and Friday and continue to take 5 mg(2 pills) on every other day INR today is 1.8  (goal is 2-3) was slightly low but no changes for today Recheck in 2 weeks      Scott Pina, MD [place] 07/30/2019, 8:30 AM

## 2019-07-30 NOTE — Telephone Encounter (Signed)
Fax received mdINR PT/INR self testing service Test date/time 07/29/19 1130a INR 1.8

## 2019-07-30 NOTE — Telephone Encounter (Signed)
Pt made aware and understood how to continue taking Coumadin

## 2019-07-31 DIAGNOSIS — I5042 Chronic combined systolic (congestive) and diastolic (congestive) heart failure: Secondary | ICD-10-CM | POA: Diagnosis not present

## 2019-07-31 DIAGNOSIS — J440 Chronic obstructive pulmonary disease with acute lower respiratory infection: Secondary | ICD-10-CM | POA: Diagnosis not present

## 2019-07-31 DIAGNOSIS — N1831 Chronic kidney disease, stage 3a: Secondary | ICD-10-CM | POA: Diagnosis not present

## 2019-07-31 DIAGNOSIS — I4811 Longstanding persistent atrial fibrillation: Secondary | ICD-10-CM | POA: Diagnosis not present

## 2019-07-31 DIAGNOSIS — I70209 Unspecified atherosclerosis of native arteries of extremities, unspecified extremity: Secondary | ICD-10-CM | POA: Diagnosis not present

## 2019-07-31 DIAGNOSIS — I130 Hypertensive heart and chronic kidney disease with heart failure and stage 1 through stage 4 chronic kidney disease, or unspecified chronic kidney disease: Secondary | ICD-10-CM | POA: Diagnosis not present

## 2019-08-01 DIAGNOSIS — N1831 Chronic kidney disease, stage 3a: Secondary | ICD-10-CM | POA: Diagnosis not present

## 2019-08-01 DIAGNOSIS — I4811 Longstanding persistent atrial fibrillation: Secondary | ICD-10-CM | POA: Diagnosis not present

## 2019-08-01 DIAGNOSIS — I130 Hypertensive heart and chronic kidney disease with heart failure and stage 1 through stage 4 chronic kidney disease, or unspecified chronic kidney disease: Secondary | ICD-10-CM | POA: Diagnosis not present

## 2019-08-01 DIAGNOSIS — I70209 Unspecified atherosclerosis of native arteries of extremities, unspecified extremity: Secondary | ICD-10-CM | POA: Diagnosis not present

## 2019-08-01 DIAGNOSIS — J440 Chronic obstructive pulmonary disease with acute lower respiratory infection: Secondary | ICD-10-CM | POA: Diagnosis not present

## 2019-08-01 DIAGNOSIS — I5042 Chronic combined systolic (congestive) and diastolic (congestive) heart failure: Secondary | ICD-10-CM | POA: Diagnosis not present

## 2019-08-02 ENCOUNTER — Ambulatory Visit: Payer: Medicare Other | Admitting: *Deleted

## 2019-08-02 DIAGNOSIS — I5042 Chronic combined systolic (congestive) and diastolic (congestive) heart failure: Secondary | ICD-10-CM | POA: Diagnosis not present

## 2019-08-02 DIAGNOSIS — J440 Chronic obstructive pulmonary disease with acute lower respiratory infection: Secondary | ICD-10-CM | POA: Diagnosis not present

## 2019-08-02 DIAGNOSIS — N1831 Chronic kidney disease, stage 3a: Secondary | ICD-10-CM | POA: Diagnosis not present

## 2019-08-02 DIAGNOSIS — I130 Hypertensive heart and chronic kidney disease with heart failure and stage 1 through stage 4 chronic kidney disease, or unspecified chronic kidney disease: Secondary | ICD-10-CM | POA: Diagnosis not present

## 2019-08-02 DIAGNOSIS — I70209 Unspecified atherosclerosis of native arteries of extremities, unspecified extremity: Secondary | ICD-10-CM | POA: Diagnosis not present

## 2019-08-02 DIAGNOSIS — I4811 Longstanding persistent atrial fibrillation: Secondary | ICD-10-CM | POA: Diagnosis not present

## 2019-08-02 NOTE — Chronic Care Management (AMB) (Signed)
  Care Management   Red EMMI Follow-up Note  08/02/2019 Name: Scott [name] MRN: [id number] DOB: 1922-03-29  Mr Tiso was recently discharged from [facility] in [place]. He has home health services in place and an in-home caregiver 24/7. I was asked to talk with Mr Sickinger regarding a red EMMI flag from a follow-up telephone call after discharge.     I spoke with his caregiver by telephone today and discussed his medications. No changes were made except for the addition of hydrocodone 5-325 which is on his med list.   Palliative care services were discussed prior to discharge and I have mentioned this to Dr Dettinger. He plans to discuss it further at his upcoming visit. Patient has been outreached for CCM services but declined.   Follow up plan: Patient is scheduled for a hospital f/u with Dr Dettinger on 08/07/19 CCM team will follow patient if provider and patient/ family desire  Chong Sicilian, BSN, RN-BC [place] [facility] [place] Management Direct Dial: [phone]

## 2019-08-07 ENCOUNTER — Encounter: Payer: Self-pay | Admitting: Family Medicine

## 2019-08-07 ENCOUNTER — Telehealth: Payer: Self-pay | Admitting: Family Medicine

## 2019-08-07 ENCOUNTER — Other Ambulatory Visit: Payer: Self-pay

## 2019-08-07 ENCOUNTER — Ambulatory Visit (INDEPENDENT_AMBULATORY_CARE_PROVIDER_SITE_OTHER): Payer: Medicare Other | Admitting: Family Medicine

## 2019-08-07 VITALS — BP 110/60 | HR 71 | Temp 97.7°F | Ht 65.0 in | Wt 156.0 lb

## 2019-08-07 DIAGNOSIS — I4891 Unspecified atrial fibrillation: Secondary | ICD-10-CM | POA: Diagnosis not present

## 2019-08-07 DIAGNOSIS — I70209 Unspecified atherosclerosis of native arteries of extremities, unspecified extremity: Secondary | ICD-10-CM | POA: Diagnosis not present

## 2019-08-07 DIAGNOSIS — I130 Hypertensive heart and chronic kidney disease with heart failure and stage 1 through stage 4 chronic kidney disease, or unspecified chronic kidney disease: Secondary | ICD-10-CM | POA: Diagnosis not present

## 2019-08-07 DIAGNOSIS — Z86711 Personal history of pulmonary embolism: Secondary | ICD-10-CM | POA: Diagnosis not present

## 2019-08-07 DIAGNOSIS — I5043 Acute on chronic combined systolic (congestive) and diastolic (congestive) heart failure: Secondary | ICD-10-CM | POA: Diagnosis not present

## 2019-08-07 DIAGNOSIS — N1831 Chronic kidney disease, stage 3a: Secondary | ICD-10-CM | POA: Diagnosis not present

## 2019-08-07 DIAGNOSIS — I5042 Chronic combined systolic (congestive) and diastolic (congestive) heart failure: Secondary | ICD-10-CM | POA: Diagnosis not present

## 2019-08-07 DIAGNOSIS — R569 Unspecified convulsions: Secondary | ICD-10-CM | POA: Diagnosis not present

## 2019-08-07 DIAGNOSIS — J440 Chronic obstructive pulmonary disease with acute lower respiratory infection: Secondary | ICD-10-CM | POA: Diagnosis not present

## 2019-08-07 DIAGNOSIS — I4811 Longstanding persistent atrial fibrillation: Secondary | ICD-10-CM | POA: Diagnosis not present

## 2019-08-07 LAB — COAGUCHEK XS/INR WAIVED
INR: 2 — ABNORMAL HIGH (ref 0.9–1.1)
Prothrombin Time: 24.5 s

## 2019-08-07 NOTE — Telephone Encounter (Signed)
BMI is included in that note.  Not sure why this is being sent to me.  Please have them refer to notes that have been faxed or refax the note and circle requested information for their reference. From note: BP 157/75  Pulse 56  Temp 97.8 F (36.6 C) (Oral) Ht 5\' 7"  (1.702 m) BMI 25.53 kg/m BSA 1.87 m

## 2019-08-07 NOTE — Progress Notes (Signed)
BP 110/60   Pulse 71   Temp 97.7 F (36.5 C)   Ht 5' 5"  (1.651 m)   Wt 156 lb (70.8 kg) Comment: Left leg amputee. Wheelchair today. Wt 156lb per pt  SpO2 94%   BMI 25.96 kg/m    Subjective:   Patient ID: Scott [name], male    DOB: 11-15-1921, 86 y.o.   MRN: [id number]  HPI: Scott [name] is a 86 y.o. male presenting on 08/07/2019 for Atrial Fibrillation and Hospitalization Follow-up (s/p seizures caused by ATB Rocephin)   HPI Patient is coming in for hospital follow-up for A. fib and COPD exacerbation and seizures.  He was in the hospital initially with possible infection and was treated with Rocephin and then had some seizures and I think the seizures may have been due to the Rocephin I started him on Keppra but then took him off and put him on gabapentin after leaving the hospital.  He is having good breathing and his oxygen saturations have been between 90 and 93% at home and son says he is doing a lot better in that regards.  Patient is mostly wheelchair-bound at this point.  His cardiac ejection fraction in the hospital was 35 to 40%.  He was sent to a rehab facility try and get strength after leaving the hospital and now is back at home.  They deny any cough or congestion or fevers or chills or urinary complaints.  He did have one episode of blood in his urine and stool but other than that none since.  He has not had any seizures since leaving the hospital.  Patient does have severe dementia and most of the history is provided by his son.  Relevant past medical, surgical, family and social history reviewed and updated as indicated. Interim medical history since our last visit reviewed. Allergies and medications reviewed and updated.  Review of Systems  Constitutional: Negative for chills, fatigue and fever.  Respiratory: Negative for shortness of breath and wheezing.   Cardiovascular: Negative for chest pain and leg swelling.  Gastrointestinal: Positive for blood in stool.    Genitourinary: Positive for hematuria.  Musculoskeletal: Negative for back pain and gait problem.  Skin: Negative for rash.  Neurological: Negative for dizziness and light-headedness.  Psychiatric/Behavioral: Positive for confusion.  All other systems reviewed and are negative.   Per HPI unless specifically indicated above   Allergies as of 08/07/2019      Reactions   Rocephin [ceftriaxone Sodium In Dextrose] Other (See Comments)   Recurrent seizures   Lipitor [atorvastatin] Other (See Comments)   Unknown rxn per pt      Medication List       Accurate as of August 07, 2019 11:56 AM. If you have any questions, ask your nurse or doctor.        acetaminophen 325 MG tablet Commonly known as: TYLENOL Take 2 tablets (650 mg total) by mouth every 6 (six) hours as needed for mild pain (or Fever >/= 101).   albuterol (2.5 MG/3ML) 0.083% nebulizer solution Commonly known as: PROVENTIL Take 3 mLs (2.5 mg total) by nebulization every 6 (six) hours as needed for wheezing or shortness of breath.   albuterol (2.5 MG/3ML) 0.083% nebulizer solution Commonly known as: PROVENTIL Take 3 mLs (2.5 mg total) by nebulization every 6 (six) hours as needed for wheezing or shortness of breath.   albuterol (2.5 MG/3ML) 0.083% nebulizer solution Commonly known as: PROVENTIL Take 3 mLs (2.5 mg total) by nebulization  2 (two) times daily.   BiDil 20-37.5 MG tablet Generic drug: isosorbide-hydrALAZINE Take 1 tablet by mouth 2 (two) times daily.   carvedilol 6.25 MG tablet Commonly known as: COREG Take 1 tablet (6.25 mg total) by mouth 2 (two) times daily with a meal. (Needs to be seen before next refill)   Combivent Respimat 20-100 MCG/ACT Aers respimat Generic drug: Ipratropium-Albuterol Inhale 1 puff into the lungs every 6 (six) hours as needed for wheezing or shortness of breath (As needed for wheezing , cough or shortness of breath).   donepezil 10 MG tablet Commonly known as: ARICEPT TAKE  ONE TABLET AT BEDTIME   Ensure Nutrition Shake Liqd Take 1 Bottle by mouth 4 (four) times daily. 1 bottle of chocolate flavored Ensure up to 4 times a day--give with ice-please   febuxostat 40 MG tablet Commonly known as: ULORIC TAKE 1 TABLET DAILY   fluticasone 50 MCG/ACT nasal spray Commonly known as: FLONASE Place 1 spray into both nostrils daily as needed for allergies or rhinitis. What changed: when to take this   folic acid 1 MG tablet Commonly known as: FOLVITE TAKE 1 TABLET DAILY   furosemide 20 MG tablet Commonly known as: LASIX Take 1 tablet (20 mg total) by mouth in the morning.   furosemide 40 MG tablet Commonly known as: LASIX Take 1 tablet (40 mg total) by mouth 2 (two) times daily. (Needs to be seen before next refill)   gabapentin 400 MG capsule Commonly known as: NEURONTIN Take 1 capsule (400 mg total) by mouth 2 (two) times daily.   gabapentin 100 MG capsule Commonly known as: NEURONTIN Take 2 capsules (200 mg total) by mouth 2 (two) times daily. (Needs to be seen before next refill)   HYDROcodone-acetaminophen 5-325 MG tablet Commonly known as: NORCO/VICODIN Take 1 tablet by mouth every 12 (twelve) hours as needed for moderate pain.   Myrbetriq 25 MG Tb24 tablet Generic drug: mirabegron ER Take 25 mg by mouth every morning.   pantoprazole 40 MG tablet Commonly known as: PROTONIX TAKE 1 TABLET DAILY   predniSONE 20 MG tablet Commonly known as: Deltasone Take 1 tablet (20 mg total) by mouth daily with breakfast.   sertraline 50 MG tablet Commonly known as: ZOLOFT TAKE 1 TABLET IN THE MORNING   simvastatin 10 MG tablet Commonly known as: ZOCOR TAKE 1 TABLET ONCE DAILY IN THE EVENING What changed: See the new instructions.   warfarin 2.5 MG tablet Commonly known as: COUMADIN Take as directed by the anticoagulation clinic. If you are unsure how to take this medication, talk to your nurse or doctor. Original instructions: Take 1 tablet (2.5  mg total) by mouth every evening.        Objective:   BP 110/60   Pulse 71   Temp 97.7 F (36.5 C)   Ht 5' 5"  (1.651 m)   Wt 156 lb (70.8 kg) Comment: Left leg amputee. Wheelchair today. Wt 156lb per pt  SpO2 94%   BMI 25.96 kg/m   Wt Readings from Last 3 Encounters:  08/07/19 156 lb (70.8 kg)  07/07/19 155 lb 13.8 oz (70.7 kg)  01/26/19 190 lb (86.2 kg)    Physical Exam Vitals and nursing note reviewed.  Constitutional:      General: He is not in acute distress.    Appearance: He is well-developed. He is not diaphoretic.  Eyes:     General: No scleral icterus.    Conjunctiva/sclera: Conjunctivae normal.  Neck:  Thyroid: No thyromegaly.  Cardiovascular:     Rate and Rhythm: Normal rate and regular rhythm.     Heart sounds: Normal heart sounds. No murmur.  Pulmonary:     Effort: Pulmonary effort is normal. No respiratory distress.     Breath sounds: Rales (Bilateral crackles) present. No wheezing.  Musculoskeletal:        General: Normal range of motion.     Cervical back: Neck supple.     Comments: Right BKA, wheelchair-bound  Lymphadenopathy:     Cervical: No cervical adenopathy.  Skin:    General: Skin is warm and dry.     Findings: No rash.  Neurological:     Mental Status: He is alert and oriented to person, place, and time.     Coordination: Coordination normal.  Psychiatric:        Behavior: Behavior normal.       Assessment & Plan:   Problem List Items Addressed This Visit      Cardiovascular and Mediastinum   Atrial fibrillation with RVR ([place])   Relevant Orders   CBC with Differential/Platelet   CMP14+EGFR   CoaguChek XS/INR Waived     Genitourinary   CKD (chronic kidney disease) stage 3, GFR 30-59 ml/min   Relevant Orders   CBC with Differential/Platelet   CMP14+EGFR     Other   History of pulmonary embolism (2014)    Other Visit Diagnoses    Seizure ([place])    -  Primary   Relevant Orders   Ambulatory referral to Neurology    Acute on chronic combined systolic and diastolic congestive heart failure ([place])       Relevant Orders   CBC with Differential/Platelet   CMP14+EGFR      Discussed palliative care and also discussed reducing or coming off of Coumadin because of risk with memory and dementia and falls.  Breathing has been doing good since he is home and has been off oxygen, he did have one episode of bleeding in his urine and his stool per son but has not had it since. Follow up plan: Return in about 4 weeks (around 09/04/2019), or if symptoms worsen or fail to improve, for INR recheck.  Counseling provided for all of the vaccine components Orders Placed This Encounter  Procedures  . CBC with Differential/Platelet  . CMP14+EGFR  . Ambulatory referral to Neurology    Caryl Pina, MD [facility] 08/07/2019, 11:56 AM

## 2019-08-07 NOTE — Telephone Encounter (Signed)
Im not sure why this can't be reverse calculated. BMI and height are there. Weight is 161.9# I am cc'ing this to billing, as the constant phone calls and requests for addendums on a note >84 year old are getting out of hand at this point.

## 2019-08-07 NOTE — Telephone Encounter (Signed)
Billing personnel has taken care of this.

## 2019-08-08 ENCOUNTER — Ambulatory Visit (INDEPENDENT_AMBULATORY_CARE_PROVIDER_SITE_OTHER): Payer: Medicare Other

## 2019-08-08 ENCOUNTER — Telehealth: Payer: Self-pay | Admitting: *Deleted

## 2019-08-08 DIAGNOSIS — I2510 Atherosclerotic heart disease of native coronary artery without angina pectoris: Secondary | ICD-10-CM

## 2019-08-08 DIAGNOSIS — I4811 Longstanding persistent atrial fibrillation: Secondary | ICD-10-CM

## 2019-08-08 DIAGNOSIS — I5042 Chronic combined systolic (congestive) and diastolic (congestive) heart failure: Secondary | ICD-10-CM | POA: Diagnosis not present

## 2019-08-08 DIAGNOSIS — J189 Pneumonia, unspecified organism: Secondary | ICD-10-CM | POA: Diagnosis not present

## 2019-08-08 DIAGNOSIS — J440 Chronic obstructive pulmonary disease with acute lower respiratory infection: Secondary | ICD-10-CM | POA: Diagnosis not present

## 2019-08-08 DIAGNOSIS — N1831 Chronic kidney disease, stage 3a: Secondary | ICD-10-CM

## 2019-08-08 DIAGNOSIS — I447 Left bundle-branch block, unspecified: Secondary | ICD-10-CM

## 2019-08-08 DIAGNOSIS — I429 Cardiomyopathy, unspecified: Secondary | ICD-10-CM

## 2019-08-08 DIAGNOSIS — I130 Hypertensive heart and chronic kidney disease with heart failure and stage 1 through stage 4 chronic kidney disease, or unspecified chronic kidney disease: Secondary | ICD-10-CM

## 2019-08-08 DIAGNOSIS — I4891 Unspecified atrial fibrillation: Secondary | ICD-10-CM

## 2019-08-08 DIAGNOSIS — Z86711 Personal history of pulmonary embolism: Secondary | ICD-10-CM

## 2019-08-08 DIAGNOSIS — I70209 Unspecified atherosclerosis of native arteries of extremities, unspecified extremity: Secondary | ICD-10-CM

## 2019-08-08 LAB — CBC WITH DIFFERENTIAL/PLATELET
Basophils Absolute: 0 10*3/uL (ref 0.0–0.2)
Basos: 1 %
EOS (ABSOLUTE): 0.1 10*3/uL (ref 0.0–0.4)
Eos: 2 %
Hematocrit: 39.6 % (ref 37.5–51.0)
Hemoglobin: 12.8 g/dL — ABNORMAL LOW (ref 13.0–17.7)
Immature Grans (Abs): 0 10*3/uL (ref 0.0–0.1)
Immature Granulocytes: 1 %
Lymphocytes Absolute: 1.3 10*3/uL (ref 0.7–3.1)
Lymphs: 28 %
MCH: 27.9 pg (ref 26.6–33.0)
MCHC: 32.3 g/dL (ref 31.5–35.7)
MCV: 86 fL (ref 79–97)
Monocytes Absolute: 0.5 10*3/uL (ref 0.1–0.9)
Monocytes: 10 %
Neutrophils Absolute: 2.6 10*3/uL (ref 1.4–7.0)
Neutrophils: 58 %
Platelets: 147 10*3/uL — ABNORMAL LOW (ref 150–450)
RBC: 4.59 x10E6/uL (ref 4.14–5.80)
RDW: 15.7 % — ABNORMAL HIGH (ref 11.6–15.4)
WBC: 4.5 10*3/uL (ref 3.4–10.8)

## 2019-08-08 LAB — CMP14+EGFR
ALT: 10 IU/L (ref 0–44)
AST: 17 IU/L (ref 0–40)
Albumin/Globulin Ratio: 1.7 (ref 1.2–2.2)
Albumin: 4 g/dL (ref 3.5–4.6)
Alkaline Phosphatase: 127 IU/L — ABNORMAL HIGH (ref 39–117)
BUN/Creatinine Ratio: 22 (ref 10–24)
BUN: 37 mg/dL — ABNORMAL HIGH (ref 10–36)
Bilirubin Total: 0.3 mg/dL (ref 0.0–1.2)
CO2: 26 mmol/L (ref 20–29)
Calcium: 9.6 mg/dL (ref 8.6–10.2)
Chloride: 102 mmol/L (ref 96–106)
Creatinine, Ser: 1.69 mg/dL — ABNORMAL HIGH (ref 0.76–1.27)
GFR calc Af Amer: 39 mL/min/{1.73_m2} — ABNORMAL LOW (ref >59)
GFR calc non Af Amer: 34 mL/min/{1.73_m2} — ABNORMAL LOW (ref >59)
Globulin, Total: 2.3 g/dL (ref 1.5–4.5)
Glucose: 110 mg/dL — ABNORMAL HIGH (ref 65–99)
Potassium: 3.7 mmol/L (ref 3.5–5.2)
Sodium: 142 mmol/L (ref 134–144)
Total Protein: 6.3 g/dL (ref 6.0–8.5)

## 2019-08-08 NOTE — Telephone Encounter (Signed)
Patient aware and verbalized understanding. °

## 2019-08-08 NOTE — Telephone Encounter (Signed)
Description   Continue to take 7.5 mg on Tuesday and Friday and continue to take 5 mg(2 pills) on every other day INR today is 2.2  (goal is 2-3) was slightly low but no changes for today Recheck in 5-6 weeks     Caryl Pina, MD [facility] 08/08/2019, 4:09 PM

## 2019-08-08 NOTE — Telephone Encounter (Signed)
Fax received [facility] PT/INR self testing service Test date/time 08/08/19 1203pm INR 2.2

## 2019-08-10 DIAGNOSIS — N1831 Chronic kidney disease, stage 3a: Secondary | ICD-10-CM | POA: Diagnosis not present

## 2019-08-10 DIAGNOSIS — I4811 Longstanding persistent atrial fibrillation: Secondary | ICD-10-CM | POA: Diagnosis not present

## 2019-08-10 DIAGNOSIS — I130 Hypertensive heart and chronic kidney disease with heart failure and stage 1 through stage 4 chronic kidney disease, or unspecified chronic kidney disease: Secondary | ICD-10-CM | POA: Diagnosis not present

## 2019-08-10 DIAGNOSIS — I70209 Unspecified atherosclerosis of native arteries of extremities, unspecified extremity: Secondary | ICD-10-CM | POA: Diagnosis not present

## 2019-08-10 DIAGNOSIS — I5042 Chronic combined systolic (congestive) and diastolic (congestive) heart failure: Secondary | ICD-10-CM | POA: Diagnosis not present

## 2019-08-10 DIAGNOSIS — J440 Chronic obstructive pulmonary disease with acute lower respiratory infection: Secondary | ICD-10-CM | POA: Diagnosis not present

## 2019-08-13 ENCOUNTER — Other Ambulatory Visit: Payer: Self-pay | Admitting: Family Medicine

## 2019-08-13 ENCOUNTER — Other Ambulatory Visit: Payer: Self-pay | Admitting: Family

## 2019-08-13 DIAGNOSIS — I130 Hypertensive heart and chronic kidney disease with heart failure and stage 1 through stage 4 chronic kidney disease, or unspecified chronic kidney disease: Secondary | ICD-10-CM | POA: Diagnosis not present

## 2019-08-13 DIAGNOSIS — I70209 Unspecified atherosclerosis of native arteries of extremities, unspecified extremity: Secondary | ICD-10-CM | POA: Diagnosis not present

## 2019-08-13 DIAGNOSIS — J440 Chronic obstructive pulmonary disease with acute lower respiratory infection: Secondary | ICD-10-CM | POA: Diagnosis not present

## 2019-08-13 DIAGNOSIS — I5042 Chronic combined systolic (congestive) and diastolic (congestive) heart failure: Secondary | ICD-10-CM | POA: Diagnosis not present

## 2019-08-13 DIAGNOSIS — N1831 Chronic kidney disease, stage 3a: Secondary | ICD-10-CM | POA: Diagnosis not present

## 2019-08-13 DIAGNOSIS — I4811 Longstanding persistent atrial fibrillation: Secondary | ICD-10-CM | POA: Diagnosis not present

## 2019-08-14 ENCOUNTER — Telehealth: Payer: Self-pay | Admitting: *Deleted

## 2019-08-14 DIAGNOSIS — I5042 Chronic combined systolic (congestive) and diastolic (congestive) heart failure: Secondary | ICD-10-CM | POA: Diagnosis not present

## 2019-08-14 DIAGNOSIS — Z86711 Personal history of pulmonary embolism: Secondary | ICD-10-CM

## 2019-08-14 DIAGNOSIS — I70209 Unspecified atherosclerosis of native arteries of extremities, unspecified extremity: Secondary | ICD-10-CM | POA: Diagnosis not present

## 2019-08-14 DIAGNOSIS — J440 Chronic obstructive pulmonary disease with acute lower respiratory infection: Secondary | ICD-10-CM | POA: Diagnosis not present

## 2019-08-14 DIAGNOSIS — I4891 Unspecified atrial fibrillation: Secondary | ICD-10-CM

## 2019-08-14 DIAGNOSIS — I4821 Permanent atrial fibrillation: Secondary | ICD-10-CM | POA: Diagnosis not present

## 2019-08-14 DIAGNOSIS — I4811 Longstanding persistent atrial fibrillation: Secondary | ICD-10-CM | POA: Diagnosis not present

## 2019-08-14 DIAGNOSIS — I130 Hypertensive heart and chronic kidney disease with heart failure and stage 1 through stage 4 chronic kidney disease, or unspecified chronic kidney disease: Secondary | ICD-10-CM | POA: Diagnosis not present

## 2019-08-14 DIAGNOSIS — N1831 Chronic kidney disease, stage 3a: Secondary | ICD-10-CM | POA: Diagnosis not present

## 2019-08-14 NOTE — Telephone Encounter (Signed)
Fax received [facility] PT/INR self testing service Test date/time 08/14/19 1257 pm INR 2.0

## 2019-08-17 ENCOUNTER — Other Ambulatory Visit: Payer: Self-pay | Admitting: Family Medicine

## 2019-08-21 ENCOUNTER — Telehealth: Payer: Self-pay | Admitting: *Deleted

## 2019-08-21 ENCOUNTER — Encounter: Payer: Self-pay | Admitting: Family Medicine

## 2019-08-21 DIAGNOSIS — I5042 Chronic combined systolic (congestive) and diastolic (congestive) heart failure: Secondary | ICD-10-CM | POA: Diagnosis not present

## 2019-08-21 DIAGNOSIS — I4811 Longstanding persistent atrial fibrillation: Secondary | ICD-10-CM | POA: Diagnosis not present

## 2019-08-21 DIAGNOSIS — Z86711 Personal history of pulmonary embolism: Secondary | ICD-10-CM

## 2019-08-21 DIAGNOSIS — I509 Heart failure, unspecified: Secondary | ICD-10-CM | POA: Diagnosis not present

## 2019-08-21 DIAGNOSIS — I130 Hypertensive heart and chronic kidney disease with heart failure and stage 1 through stage 4 chronic kidney disease, or unspecified chronic kidney disease: Secondary | ICD-10-CM | POA: Diagnosis not present

## 2019-08-21 DIAGNOSIS — J440 Chronic obstructive pulmonary disease with acute lower respiratory infection: Secondary | ICD-10-CM | POA: Diagnosis not present

## 2019-08-21 DIAGNOSIS — I70209 Unspecified atherosclerosis of native arteries of extremities, unspecified extremity: Secondary | ICD-10-CM | POA: Diagnosis not present

## 2019-08-21 DIAGNOSIS — N1831 Chronic kidney disease, stage 3a: Secondary | ICD-10-CM | POA: Diagnosis not present

## 2019-08-21 DIAGNOSIS — I4891 Unspecified atrial fibrillation: Secondary | ICD-10-CM

## 2019-08-21 NOTE — Telephone Encounter (Signed)
Fax received [facility] PT/INR self testing service Test date/time 08/21/19 1033 am INR 2.0

## 2019-08-21 NOTE — Telephone Encounter (Signed)
Continue to take 7.5 mg on Tuesday and Friday and continue to take 5 mg(2 pills) on every other day INR today is 2.0  (goal is 2-3) At goal Recheck in 5-6 weeks

## 2019-08-21 NOTE — Telephone Encounter (Signed)
Patient aware and verbalized understanding. °

## 2019-08-22 DIAGNOSIS — G894 Chronic pain syndrome: Secondary | ICD-10-CM | POA: Diagnosis not present

## 2019-08-22 DIAGNOSIS — G4710 Hypersomnia, unspecified: Secondary | ICD-10-CM | POA: Diagnosis not present

## 2019-08-22 DIAGNOSIS — R269 Unspecified abnormalities of gait and mobility: Secondary | ICD-10-CM | POA: Diagnosis not present

## 2019-08-22 DIAGNOSIS — Z79899 Other long term (current) drug therapy: Secondary | ICD-10-CM | POA: Diagnosis not present

## 2019-08-23 ENCOUNTER — Telehealth: Payer: Self-pay | Admitting: Family Medicine

## 2019-08-23 NOTE — Telephone Encounter (Signed)
I do not see or have the labs, I do not know where they have ended up.  He was looking like he was dehydrated on the previous labs which were just a week before and if he is not feeling well then I agree with him going to the emergency department.

## 2019-08-23 NOTE — Telephone Encounter (Signed)
Caregiver aware. Called [facility] for lab results, was advised results should be back tomorrow.

## 2019-08-23 NOTE — Telephone Encounter (Signed)
I called sitter, he said that Scott [name] can not explain any symptoms, he just doesn't feel well. Have you seen any labs from [facility]?

## 2019-08-24 ENCOUNTER — Telehealth: Payer: Self-pay | Admitting: Family Medicine

## 2019-08-24 DIAGNOSIS — N189 Chronic kidney disease, unspecified: Secondary | ICD-10-CM | POA: Diagnosis not present

## 2019-08-24 DIAGNOSIS — I509 Heart failure, unspecified: Secondary | ICD-10-CM | POA: Diagnosis not present

## 2019-08-24 NOTE — Telephone Encounter (Signed)
Labs were lost and they are going to redraw

## 2019-08-26 DIAGNOSIS — I2510 Atherosclerotic heart disease of native coronary artery without angina pectoris: Secondary | ICD-10-CM | POA: Diagnosis not present

## 2019-08-26 DIAGNOSIS — I130 Hypertensive heart and chronic kidney disease with heart failure and stage 1 through stage 4 chronic kidney disease, or unspecified chronic kidney disease: Secondary | ICD-10-CM | POA: Diagnosis not present

## 2019-08-26 DIAGNOSIS — I429 Cardiomyopathy, unspecified: Secondary | ICD-10-CM | POA: Diagnosis not present

## 2019-08-26 DIAGNOSIS — I70209 Unspecified atherosclerosis of native arteries of extremities, unspecified extremity: Secondary | ICD-10-CM | POA: Diagnosis not present

## 2019-08-26 DIAGNOSIS — N1831 Chronic kidney disease, stage 3a: Secondary | ICD-10-CM | POA: Diagnosis not present

## 2019-08-26 DIAGNOSIS — I4811 Longstanding persistent atrial fibrillation: Secondary | ICD-10-CM | POA: Diagnosis not present

## 2019-08-26 DIAGNOSIS — J189 Pneumonia, unspecified organism: Secondary | ICD-10-CM | POA: Diagnosis not present

## 2019-08-26 DIAGNOSIS — I447 Left bundle-branch block, unspecified: Secondary | ICD-10-CM | POA: Diagnosis not present

## 2019-08-26 DIAGNOSIS — I5042 Chronic combined systolic (congestive) and diastolic (congestive) heart failure: Secondary | ICD-10-CM | POA: Diagnosis not present

## 2019-08-26 DIAGNOSIS — J440 Chronic obstructive pulmonary disease with acute lower respiratory infection: Secondary | ICD-10-CM | POA: Diagnosis not present

## 2019-08-27 DIAGNOSIS — I130 Hypertensive heart and chronic kidney disease with heart failure and stage 1 through stage 4 chronic kidney disease, or unspecified chronic kidney disease: Secondary | ICD-10-CM | POA: Diagnosis not present

## 2019-08-27 DIAGNOSIS — I4811 Longstanding persistent atrial fibrillation: Secondary | ICD-10-CM | POA: Diagnosis not present

## 2019-08-27 DIAGNOSIS — I5042 Chronic combined systolic (congestive) and diastolic (congestive) heart failure: Secondary | ICD-10-CM | POA: Diagnosis not present

## 2019-08-27 DIAGNOSIS — I70209 Unspecified atherosclerosis of native arteries of extremities, unspecified extremity: Secondary | ICD-10-CM | POA: Diagnosis not present

## 2019-08-27 DIAGNOSIS — J440 Chronic obstructive pulmonary disease with acute lower respiratory infection: Secondary | ICD-10-CM | POA: Diagnosis not present

## 2019-08-27 DIAGNOSIS — N1831 Chronic kidney disease, stage 3a: Secondary | ICD-10-CM | POA: Diagnosis not present

## 2019-08-29 ENCOUNTER — Other Ambulatory Visit: Payer: Self-pay | Admitting: Family Medicine

## 2019-08-30 ENCOUNTER — Emergency Department (HOSPITAL_COMMUNITY): Payer: Medicare Other

## 2019-08-30 ENCOUNTER — Encounter (HOSPITAL_COMMUNITY): Payer: Self-pay | Admitting: *Deleted

## 2019-08-30 ENCOUNTER — Other Ambulatory Visit: Payer: Self-pay

## 2019-08-30 ENCOUNTER — Emergency Department (HOSPITAL_COMMUNITY)
Admission: EM | Admit: 2019-08-30 | Discharge: 2019-08-31 | Disposition: A | Discharge status: Home / Self Care | Payer: Medicare Other | Attending: Emergency Medicine | Admitting: Emergency Medicine

## 2019-08-30 DIAGNOSIS — R319 Hematuria, unspecified: Secondary | ICD-10-CM | POA: Insufficient documentation

## 2019-08-30 DIAGNOSIS — F0390 Unspecified dementia without behavioral disturbance: Secondary | ICD-10-CM | POA: Diagnosis not present

## 2019-08-30 DIAGNOSIS — I130 Hypertensive heart and chronic kidney disease with heart failure and stage 1 through stage 4 chronic kidney disease, or unspecified chronic kidney disease: Secondary | ICD-10-CM | POA: Diagnosis not present

## 2019-08-30 DIAGNOSIS — R5381 Other malaise: Secondary | ICD-10-CM | POA: Diagnosis not present

## 2019-08-30 DIAGNOSIS — I5042 Chronic combined systolic (congestive) and diastolic (congestive) heart failure: Secondary | ICD-10-CM | POA: Diagnosis not present

## 2019-08-30 DIAGNOSIS — Z79899 Other long term (current) drug therapy: Secondary | ICD-10-CM | POA: Insufficient documentation

## 2019-08-30 DIAGNOSIS — R0689 Other abnormalities of breathing: Secondary | ICD-10-CM | POA: Insufficient documentation

## 2019-08-30 DIAGNOSIS — R748 Abnormal levels of other serum enzymes: Secondary | ICD-10-CM | POA: Insufficient documentation

## 2019-08-30 DIAGNOSIS — I2510 Atherosclerotic heart disease of native coronary artery without angina pectoris: Secondary | ICD-10-CM | POA: Insufficient documentation

## 2019-08-30 DIAGNOSIS — N289 Disorder of kidney and ureter, unspecified: Secondary | ICD-10-CM | POA: Diagnosis not present

## 2019-08-30 DIAGNOSIS — Z87891 Personal history of nicotine dependence: Secondary | ICD-10-CM | POA: Insufficient documentation

## 2019-08-30 DIAGNOSIS — I517 Cardiomegaly: Secondary | ICD-10-CM | POA: Diagnosis not present

## 2019-08-30 DIAGNOSIS — R531 Weakness: Secondary | ICD-10-CM | POA: Insufficient documentation

## 2019-08-30 DIAGNOSIS — R5383 Other fatigue: Secondary | ICD-10-CM | POA: Diagnosis present

## 2019-08-30 DIAGNOSIS — Z7901 Long term (current) use of anticoagulants: Secondary | ICD-10-CM | POA: Insufficient documentation

## 2019-08-30 DIAGNOSIS — N390 Urinary tract infection, site not specified: Secondary | ICD-10-CM | POA: Diagnosis not present

## 2019-08-30 DIAGNOSIS — Z8551 Personal history of malignant neoplasm of bladder: Secondary | ICD-10-CM | POA: Insufficient documentation

## 2019-08-30 DIAGNOSIS — J449 Chronic obstructive pulmonary disease, unspecified: Secondary | ICD-10-CM | POA: Diagnosis not present

## 2019-08-30 DIAGNOSIS — I252 Old myocardial infarction: Secondary | ICD-10-CM | POA: Diagnosis not present

## 2019-08-30 DIAGNOSIS — R4182 Altered mental status, unspecified: Secondary | ICD-10-CM | POA: Diagnosis not present

## 2019-08-30 DIAGNOSIS — D649 Anemia, unspecified: Secondary | ICD-10-CM | POA: Insufficient documentation

## 2019-08-30 DIAGNOSIS — Z89612 Acquired absence of left leg above knee: Secondary | ICD-10-CM | POA: Insufficient documentation

## 2019-08-30 DIAGNOSIS — N1831 Chronic kidney disease, stage 3a: Secondary | ICD-10-CM | POA: Diagnosis not present

## 2019-08-30 DIAGNOSIS — R778 Other specified abnormalities of plasma proteins: Secondary | ICD-10-CM | POA: Insufficient documentation

## 2019-08-30 LAB — CBC WITH DIFFERENTIAL/PLATELET
Abs Immature Granulocytes: 0.02 10*3/uL (ref 0.00–0.07)
Basophils Absolute: 0 10*3/uL (ref 0.0–0.1)
Basophils Relative: 0 %
Eosinophils Absolute: 0.1 10*3/uL (ref 0.0–0.5)
Eosinophils Relative: 1 %
HCT: 42.4 % (ref 39.0–52.0)
Hemoglobin: 12.7 g/dL — ABNORMAL LOW (ref 13.0–17.0)
Immature Granulocytes: 0 %
Lymphocytes Relative: 22 %
Lymphs Abs: 1.2 10*3/uL (ref 0.7–4.0)
MCH: 27.4 pg (ref 26.0–34.0)
MCHC: 30 g/dL (ref 30.0–36.0)
MCV: 91.6 fL (ref 80.0–100.0)
Monocytes Absolute: 0.4 10*3/uL (ref 0.1–1.0)
Monocytes Relative: 6 %
Neutro Abs: 3.9 10*3/uL (ref 1.7–7.7)
Neutrophils Relative %: 71 %
Platelets: 251 10*3/uL (ref 150–400)
RBC: 4.63 MIL/uL (ref 4.22–5.81)
RDW: 16.5 % — ABNORMAL HIGH (ref 11.5–15.5)
WBC: 5.5 10*3/uL (ref 4.0–10.5)
nRBC: 0 % (ref 0.0–0.2)

## 2019-08-30 LAB — HEPATIC FUNCTION PANEL
ALT: 16 U/L (ref 0–44)
AST: 20 U/L (ref 15–41)
Albumin: 4.4 g/dL (ref 3.5–5.0)
Alkaline Phosphatase: 144 U/L — ABNORMAL HIGH (ref 38–126)
Bilirubin, Direct: 0.1 mg/dL (ref 0.0–0.2)
Indirect Bilirubin: 0.3 mg/dL (ref 0.3–0.9)
Total Bilirubin: 0.4 mg/dL (ref 0.3–1.2)
Total Protein: 8.1 g/dL (ref 6.5–8.1)

## 2019-08-30 LAB — BASIC METABOLIC PANEL
Anion gap: 9 (ref 5–15)
BUN: 25 mg/dL — ABNORMAL HIGH (ref 8–23)
CO2: 28 mmol/L (ref 22–32)
Calcium: 9.6 mg/dL (ref 8.9–10.3)
Chloride: 106 mmol/L (ref 98–111)
Creatinine, Ser: 1.3 mg/dL — ABNORMAL HIGH (ref 0.61–1.24)
GFR calc Af Amer: 54 mL/min — ABNORMAL LOW (ref >60)
GFR calc non Af Amer: 46 mL/min — ABNORMAL LOW (ref >60)
Glucose, Bld: 118 mg/dL — ABNORMAL HIGH (ref 70–99)
Potassium: 3.8 mmol/L (ref 3.5–5.1)
Sodium: 143 mmol/L (ref 135–145)

## 2019-08-30 LAB — TROPONIN I (HIGH SENSITIVITY): Troponin I (High Sensitivity): 21 ng/L — ABNORMAL HIGH (ref <18)

## 2019-08-30 LAB — PROTIME-INR
INR: 2.4 — ABNORMAL HIGH (ref 0.8–1.2)
Prothrombin Time: 25.3 seconds — ABNORMAL HIGH (ref 11.4–15.2)

## 2019-08-30 NOTE — ED Triage Notes (Signed)
Pt not feeling well for past couple of weeks, denies burning on urination.  Pt with hx of COPD and CHF.  Pt slept most part of day today and generalized weakness.

## 2019-08-30 NOTE — ED Provider Notes (Signed)
[facility] EMERGENCY DEPARTMENT Provider Note   CSN: [id number] Arrival date & time: 08/30/19  2144   History Chief Complaint  Patient presents with  . Fatigue    Scott [name] is a 86 y.o. male.  The history is provided by the patient, a relative and a caregiver. The history is limited by the condition of the patient (Dementia).  He has history of hypertension, hyperlipidemia, coronary artery disease, atrial fibrillation anticoagulated on warfarin, combined systolic and diastolic heart failure, chronic kidney disease, bladder cancer, dementia and is brought in by family because he states that he is not feeling well.  He is not able to articulate his complaints any more than that.  Caregivers have noticed that his oxygen saturations are at his baseline-between 90% and 93%.  He has had more difficulty recognizing objects and what they are used for.  He has not had any coughing or vomiting.  He has been vaccinated against COVID-19 and there have been no COVID-19 exposures that they know of.  He has not had any fever.  Family is concerned because they are going out of town for a funeral and they were to make sure that he is okay.  He has had some falls at home, but none with head injury.  He has not complained of any dysuria and there has been no vomiting or diarrhea.  Past Medical History:  Diagnosis Date  . Arthritis    "all over"  . Atrial fibrillation ([place])   . Benign localized prostatic hyperplasia with lower urinary tract symptoms (LUTS)   . Bradycardia 11/28/2015   Severe-HR in the 20s to 30s following intubation for urological procedure.  Marland Kitchen CAD- non obstructive disease by cath 3/14 cardiologist-  dr hochrein  . Chronic lower back pain   . COPD (chronic obstructive pulmonary disease) ([place])   . DDD (degenerative disc disease)   . Depression   . Dyspnea    w/ exertion and lying down (raise head)  . ETOH abuse   . GERD (gastroesophageal reflux disease)   . Glaucoma   . Glaucoma,  both eyes   . History of bladder cancer urologist-- dr Jeffie Pollock   first dx 2012--  s/p TURBT's  . History of DVT of lower extremity 03/2013   bilateral   . History of pulmonary embolus (PE) 04/2013  . HTN (hypertension)   . Hyperlipidemia   . Hypertension   . LBBB (left bundle branch block)    chronic  . Non-ischemic cardiomyopathy ([place])    last echo 01-18-2017, ef 35-40%  . NSVT (nonsustained ventricular tachycardia) ([place])    a. NSVT 06/2012; NSVT also seen during 03/2013 adm. b. Med rx. Not candidate for ICD given adv age.  Marland Kitchen PAD (peripheral artery disease) ([place])   . 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 PVCs (premature ventricular contractions)   . PVD (peripheral vascular disease) (HCC)    Right ABI .75, Left .78 (2006)  . Syncope 06/2012   a. Felt to be postural syncope related to diuretics 06/2012.  Marland Kitchen Systolic and diastolic CHF, chronic (HCC) cardiologsit-  dr hochrein   a. NICM - patent cors 06/2012, EF 40% at that time. b. 03/2013 eval: EF 20-25%.  . Vision loss, left eye    due to glaucoma  . Wears glasses     Patient Active Problem List   Diagnosis Date Noted  . POLST (Physician Orders for  Life-Sustaining Treatment)   . Goals of care, counseling/discussion   . Palliative care encounter   . UTI (urinary tract infection) 07/02/2019  . CAP (community acquired pneumonia) 07/01/2019  . Acute combined systolic (congestive) and diastolic (congestive) heart failure ([place]) 07/01/2019  . Foot pain 12/21/2018  . Cellulitis 12/21/2018  . CKD (chronic kidney disease) stage 3, GFR 30-59 ml/min 12/11/2018  . Dyspnea 12/10/2018  . Alzheimer disease ([place]) 09/07/2018  . SIRS (systemic inflammatory response syndrome) ([place]) 06/22/2018  . Paroxysmal atrial fibrillation ([place]) 01/18/2017  . PVC (pulmonary venous congestion) 01/18/2017  . Thoracic aortic atherosclerosis ([place]) 11/26/2016  .  Thrombocytopenia ([place]) 08/04/2016  . Chronic kidney disease (CKD) stage G3a/A1, moderately decreased glomerular filtration rate (GFR) between 45-59 mL/min/1.73 square meter and albuminuria creatinine ratio less than 30 mg/g (HCC) 08/04/2016  . Essential hypertension 11/29/2015  . Bradycardia 11/28/2015  . Malignant neoplasm of lateral wall of urinary bladder ([place]) 07/10/2015  . Status post above-knee amputation of left lower extremity ([place]) 10/18/2014  . Tachyarrhythmia 10/05/2014  . Aspiration pneumonia ([place]) 10/05/2014  . Sepsis due to other etiology ([place]) 10/03/2014  . Atherosclerotic PVD with ulceration ([place]) 10/03/2014  . Atrial fibrillation with RVR ([place]) 10/03/2014  . Hyperkalemia 10/03/2014  . PAD (peripheral artery disease) ([place]) 09/24/2014  . NSTEMI (non-ST elevated myocardial infarction) ([place]) 10/09/2013  . High risk medication use 05/17/2013  . BPH (benign prostatic hyperplasia) 05/17/2013  . Vitamin D deficiency 05/17/2013  . History of pulmonary embolism (2014) 04/17/2013  . NSVT (nonsustained ventricular tachycardia)- not an ICD candidate 04/17/2013  . NICM (nonischemic cardiomyopathy)- EF 20-25% by Echo 04/12/13 04/17/2013  . Chronic combined systolic and diastolic heart failure ([place]) 03/15/2013  . DVT of lower extremity, bilateral ([place]) 03/15/2013  . Constipation 11/02/2012  . Syncope 06/21/2012  . Bladder cancer ([place]) 03/15/2012  . Benign localized hyperplasia of prostate with urinary obstruction 03/15/2012  . Hyperlipidemia   . PVCs (premature ventricular contractions)   . CAD- non obstructive disease by cath 3/14     Past Surgical History:  Procedure Laterality Date  . AMPUTATION Left 10/16/2014   Procedure: AMPUTATION ABOVE KNEE- LEFT;  Surgeon: Mal Misty, MD;  Location: [facility];  Service: Vascular;  Laterality: Left;  . CARDIAC CATHETERIZATION  05-11-2004  DR [facility]   MINIMAL CORANARY PLAQUE/ NORMAL LVF/ EF 55%/  NON-OBSTRUCTIVE LAD 25%  . CARDIAC  CATHETERIZATION  06/18/2012   dr hochrein   mild luminal irregularities of coronaries, ef 45-50%  . CARDIOVASCULAR STRESS TEST  10-15-2010   LOW RISK NUCLEAR STUDY/ NO EVIDENCE OF ISCHEMIA/ NORMAL EF  . CATARACT EXTRACTION W/ INTRAOCULAR LENS  IMPLANT, BILATERAL Bilateral   . CYSTOSCOPY  10/07/2011   Procedure: CYSTOSCOPY;  Surgeon: Malka So, MD;  Location: [facility];  Service: Urology;  Laterality: N/A;  . CYSTOSCOPY N/A 10/20/2018   Procedure: CYSTOSCOPY WITH FULGURATION OF PROSTATIC URETHRAL TUMOR;  Surgeon: [name], MD;  Location: [facility];  Service: Urology;  Laterality: N/A;  . CYSTOSCOPY WITH BIOPSY  03/14/2012   Procedure: CYSTOSCOPY WITH BIOPSY;  Surgeon: Malka So, MD;  Location: [facility];  Service: Urology;  Laterality: N/A;  WITH FULGURATION  . CYSTOSCOPY WITH BIOPSY N/A 12/09/2016   Procedure: CYSTOSCOPY WITH BIOPSY AND FULGURATION;  Surgeon: [name], MD;  Location: [facility];  Service: Urology;  Laterality: N/A;  . CYSTOSCOPY WITH BIOPSY N/A 12/20/2017   Procedure: CYSTOSCOPY WITH BIOPSY WITH FULGURATION;  Surgeon: [name], MD;  Location: [facility];  Service:  Urology;  Laterality: N/A;  . CYSTOSCOPY WITH BIOPSY N/A 06/09/2018   Procedure: CYSTOSCOPY WITH BIOPSY AND FULGURATION;  Surgeon: [name], MD;  Location: [facility];  Service: Urology;  Laterality: N/A;  . CYSTOSCOPY WITH FULGERATION N/A 01/20/2016   Procedure: CYSTOSCOPY, BIOPSY WITH FULGERATION OF URTHRAL TUMOR;  Surgeon: [name], MD;  [facility];  Service: Urology;  Laterality: N/A;  . CYSTOSCOPY WITH FULGERATION N/A 06/01/2016   Procedure: CYSTOSCOPY WITH FULGERATION urethral tumors and bladder neck;  Surgeon: [name], MD;  Location: [facility];  Service: Urology;  Laterality: N/A;  . SHOULDER SURGERY Left 1970's  . TONSILLECTOMY    . TRANSTHORACIC ECHOCARDIOGRAM  01-18-2017   dr hochrein   moderate LVH, ef 35-40%, diffuse hypokinesis/  trivial AR and MR/  mild TR  . TRANSURETHRAL RESECTION  OF BLADDER TUMOR  10/07/2011   Procedure: TRANSURETHRAL RESECTION OF BLADDER TUMOR (TURBT);  Surgeon: Malka So, MD;  Location: [facility];  Service: Urology;  Laterality: N/A;  . TRANSURETHRAL RESECTION OF BLADDER TUMOR N/A 07/10/2015   Procedure: TRANSURETHRAL RESECTION OF BLADDER TUMOR (TURBT), CYSTOSCOPY ;  Surgeon: [name], MD;  [facility];  Service: Urology;  Laterality: N/A;  . TRANSURETHRAL RESECTION OF PROSTATE  03/14/2012   Procedure: TRANSURETHRAL RESECTION OF THE PROSTATE WITH GYRUS INSTRUMENTS;  Surgeon: Malka So, MD;  Location[facility];  Service: Urology;  Laterality: N/A;       Family History  Problem Relation Age of Onset  . Hypertension Mother   . Arthritis Mother   . Lung cancer Sister   . Deep vein thrombosis Father   . Early death Brother     Social History   Tobacco Use  . Smoking status: Former Smoker    Packs/day: 1.00    Years: 35.00    Pack years: 35.00    Types: Cigarettes    Start date: 04/12/1942    Quit date: 04/13/1975    Years since quitting: 44.4  . Smokeless tobacco: Never Used  Substance Use Topics  . Alcohol use: Not Currently    Alcohol/week: 7.0 standard drinks    Types: 7 Cans of beer per week  . Drug use: No    Home Medications Prior to Admission medications   Medication Sig Start Date End Date Taking? Authorizing Provider  acetaminophen (TYLENOL) 325 MG tablet Take 2 tablets (650 mg total) by mouth every 6 (six) hours as needed for mild pain (or Fever >/= 101). 12/26/18  Yes Elgergawy, Silver Huguenin, MD  albuterol (PROVENTIL) (2.5 MG/3ML) 0.083% nebulizer solution Take 3 mLs (2.5 mg total) by nebulization every 6 (six) hours as needed for wheezing or shortness of breath. 07/09/19  Yes Emokpae, Courage, MD  carvedilol (COREG) 6.25 MG tablet Take 1 tablet (6.25 mg total) by mouth 2 (two) times daily with a meal. (Needs to be seen before next refill) 07/24/19  Yes Dettinger, Fransisca Kaufmann, MD  donepezil (ARICEPT) 10 MG tablet  TAKE ONE TABLET AT BEDTIME Patient taking differently: Take 10 mg by mouth at bedtime.  04/26/19  Yes Dettinger, Fransisca Kaufmann, MD  febuxostat (ULORIC) 40 MG tablet TAKE 1 TABLET DAILY Patient taking differently: Take 40 mg by mouth in the morning.  04/26/19  Yes Dettinger, Fransisca Kaufmann, MD  furosemide (LASIX) 20 MG tablet Take 1 tablet (20 mg total) by mouth in the morning. 07/09/19  Yes Emokpae, Courage, MD  gabapentin (NEURONTIN) 400 MG capsule Take 1 capsule (400 mg total) by mouth 2 (two)  times daily. 07/09/19  Yes Emokpae, Courage, MD  isosorbide-hydrALAZINE (BIDIL) 20-37.5 MG tablet Take 1 tablet by mouth 2 (two) times daily. 07/09/19  Yes Emokpae, Courage, MD  MYRBETRIQ 25 MG TB24 tablet Take 25 mg by mouth every morning.  09/20/16  Yes [provider]  pantoprazole (PROTONIX) 40 MG tablet TAKE 1 TABLET DAILY Patient taking differently: Take 40 mg by mouth in the morning.  08/13/19  Yes Dettinger, Fransisca Kaufmann, MD  sertraline (ZOLOFT) 50 MG tablet TAKE 1 TABLET IN THE MORNING Patient taking differently: Take 50 mg by mouth in the morning.  05/24/19  Yes Dettinger, Fransisca Kaufmann, MD  simvastatin (ZOCOR) 10 MG tablet TAKE 1 TABLET ONCE DAILY IN THE EVENING Patient taking differently: Take 10 mg by mouth at bedtime.  08/13/19  Yes Dettinger, Fransisca Kaufmann, MD  warfarin (COUMADIN) 2.5 MG tablet Take 1 tablet (2.5 mg total) by mouth every evening. Patient taking differently: Take 2.5 mg by mouth every Tuesday, Thursday, Saturday, and Sunday. morning 07/09/19  Yes Emokpae, Courage, MD  fluticasone (FLONASE) 50 MCG/ACT nasal spray SPRAY 1 SPRAY IN EACH NOSTRIL ONCE DAILY. 08/29/19   Dettinger, Fransisca Kaufmann, MD  furosemide (LASIX) 40 MG tablet Take 1 tablet (40 mg total) by mouth 2 (two) times daily. (Needs to be seen before next refill) Patient not taking: Reported on 08/30/2019 07/26/19   Dettinger, Fransisca Kaufmann, MD  gabapentin (NEURONTIN) 100 MG capsule Take 2 capsules (200 mg total) by mouth 2 (two) times daily. (Needs to be seen  before next refill) Patient not taking: Reported on 08/30/2019 07/26/19   Dettinger, Fransisca Kaufmann, MD  HYDROcodone-acetaminophen (NORCO/VICODIN) 5-325 MG tablet Take 1 tablet by mouth every 12 (twelve) hours as needed for moderate pain. 07/09/19   Roxan Hockey, MD  Ipratropium-Albuterol (COMBIVENT RESPIMAT) 20-100 MCG/ACT AERS respimat Inhale 1 puff into the lungs every 6 (six) hours as needed for wheezing or shortness of breath (As needed for wheezing , cough or shortness of breath). 07/09/19   Roxan Hockey, MD  Nutritional Supplements (ENSURE NUTRITION SHAKE) LIQD Take 1 Bottle by mouth 4 (four) times daily. 1 bottle of chocolate flavored Ensure up to 4 times a day--give with ice-please 07/09/19   Roxan Hockey, MD  predniSONE (DELTASONE) 20 MG tablet Take 1 tablet (20 mg total) by mouth daily with breakfast. Patient not taking: Reported on 08/30/2019 07/09/19   Roxan Hockey, MD    Allergies    Rocephin [ceftriaxone sodium in dextrose] and Lipitor [atorvastatin]  Review of Systems   Review of Systems  Unable to perform ROS: Dementia    Physical Exam Updated Vital Signs BP (!) 137/98   Pulse 74   Temp 98.4 F (36.9 C) (Oral)   Resp 18   Ht 5\' 4"  (1.626 m)   Wt 64.4 kg   SpO2 92%   BMI 24.37 kg/m   Physical Exam Vitals and nursing note reviewed.   86 year old male, resting comfortably and in no acute distress. Vital signs are significant for mild elevation of blood pressure. Oxygen saturation is 92%, which is normal. Head is normocephalic and atraumatic. PERRLA, EOMI. Oropharynx is clear. Neck is nontender and supple without adenopathy or JVD. Back is nontender and there is no CVA tenderness. Lungs have rales at the left lateral lung base.  There are no wheezes or rhonchi. Chest is nontender. Heart has regular rate and rhythm without murmur. Abdomen is soft, flat, nontender without masses or hepatosplenomegaly and peristalsis is normoactive. Extremities: Left  above-the-knee amputation.  2+ pretibial edema present. Skin is warm and dry without rash. Neurologic: Somnolent but arousable, oriented to person but not place or time, cranial nerves are intact, there are no gross motor or sensory deficits.  ED Results / Procedures / Treatments   Labs (all labs ordered are listed, but only abnormal results are displayed) Labs Reviewed  CBC WITH DIFFERENTIAL/PLATELET - Abnormal; Notable for the following components:      Result Value   Hemoglobin 12.7 (*)    RDW 16.5 (*)    All other components within normal limits  BASIC METABOLIC PANEL - Abnormal; Notable for the following components:   Glucose, Bld 118 (*)    BUN 25 (*)    Creatinine, Ser 1.30 (*)    GFR calc non Af Amer 46 (*)    GFR calc Af Amer 54 (*)    All other components within normal limits  URINALYSIS, ROUTINE W REFLEX MICROSCOPIC - Abnormal; Notable for the following components:   APPearance HAZY (*)    Hgb urine dipstick SMALL (*)    Protein, ur 30 (*)    All other components within normal limits  HEPATIC FUNCTION PANEL - Abnormal; Notable for the following components:   Alkaline Phosphatase 144 (*)    All other components within normal limits  BRAIN NATRIURETIC PEPTIDE - Abnormal; Notable for the following components:   B Natriuretic Peptide 841.0 (*)    All other components within normal limits  PROTIME-INR - Abnormal; Notable for the following components:   Prothrombin Time 25.3 (*)    INR 2.4 (*)    All other components within normal limits  TROPONIN I (HIGH SENSITIVITY) - Abnormal; Notable for the following components:   Troponin I (High Sensitivity) 21 (*)    All other components within normal limits  TROPONIN I (HIGH SENSITIVITY) - Abnormal; Notable for the following components:   Troponin I (High Sensitivity) 18 (*)    All other components within normal limits  URINE CULTURE    Radiology DG Chest Portable 1 View  Result Date: 08/30/2019 CLINICAL DATA:  86 year old  male with malaise. EXAM: PORTABLE CHEST 1 VIEW COMPARISON:  Chest radiographs 07/03/2019 and earlier. FINDINGS: Portable AP upright view at 2313 hours. Stable cardiomegaly and mediastinal contours. Other mediastinal contours are within normal limits. Visualized tracheal air column is within normal limits. Allowing for portable technique the lungs are clear. No pneumothorax. Negative visible bowel gas pattern. Stable visualized osseous structures. IMPRESSION: Stable cardiomegaly. No acute cardiopulmonary abnormality. Electronically Signed   By: Genevie Ann M.D.   On: 08/30/2019 23:26    Procedures Procedures   Medications Ordered in ED Medications - No data to display  ED Course  I have reviewed the triage vital signs and the nursing notes.  Pertinent labs & imaging results that were available during my care of the patient were reviewed by me and considered in my medical decision making (see chart for details).  MDM Rules/Calculators/A&P Malaise of uncertain cause.  Consider electrolyte disturbance, occult infection, occult head trauma.  Chest x-ray shows no evidence of pneumonia.  CBC shows stable anemia and normal WBC without left shift.  Electrolytes are pending.  Will check CT of head.  Old records are reviewed, and he did have a hospitalization for heart failure exacerbation last March.  We will check BNP today.  Electrolytes are normal.  Creatinine is mildly elevated but improved over baseline.  BNP is elevated, but decreased compared with recent value.  Troponin is mildly elevated but  stable.  Urine does have 21-50 WBCs which is significantly more than prior urine samples have added.  Will treat for possible UTI.  Specimen is sent for culture.  He is given a prescription for nitrofurantoin, which was chosen because it is one of the few antibiotics that he is able to take that do not interact with warfarin.  Follow-up with PCP.  Return precautions discussed.  Final Clinical Impression(s) / ED  Diagnoses Final diagnoses:  Weakness  Renal insufficiency  Normochromic normocytic anemia  Urinary tract infection with hematuria, site unspecified  Elevated troponin  Alkaline phosphatase elevation    Rx / DC Orders ED Discharge Orders         Ordered    nitrofurantoin, macrocrystal-monohydrate, (MACROBID) 100 MG capsule  2 times daily     08/31/19 [id number]           Delora Fuel, MD [id number] [phone]

## 2019-08-31 ENCOUNTER — Telehealth: Payer: Self-pay | Admitting: *Deleted

## 2019-08-31 DIAGNOSIS — R531 Weakness: Secondary | ICD-10-CM | POA: Diagnosis not present

## 2019-08-31 DIAGNOSIS — R4182 Altered mental status, unspecified: Secondary | ICD-10-CM | POA: Diagnosis not present

## 2019-08-31 DIAGNOSIS — Z86711 Personal history of pulmonary embolism: Secondary | ICD-10-CM

## 2019-08-31 DIAGNOSIS — I4891 Unspecified atrial fibrillation: Secondary | ICD-10-CM

## 2019-08-31 LAB — TROPONIN I (HIGH SENSITIVITY): Troponin I (High Sensitivity): 18 ng/L — ABNORMAL HIGH (ref <18)

## 2019-08-31 LAB — URINALYSIS, ROUTINE W REFLEX MICROSCOPIC
Bacteria, UA: NONE SEEN
Bilirubin Urine: NEGATIVE
Glucose, UA: NEGATIVE mg/dL
Ketones, ur: NEGATIVE mg/dL
Leukocytes,Ua: NEGATIVE
Nitrite: NEGATIVE
Protein, ur: 30 mg/dL — AB
Specific Gravity, Urine: 1.011 (ref 1.005–1.030)
pH: 5 (ref 5.0–8.0)

## 2019-08-31 LAB — BRAIN NATRIURETIC PEPTIDE: B Natriuretic Peptide: 841 pg/mL — ABNORMAL HIGH (ref 0.0–100.0)

## 2019-08-31 MED ORDER — NITROFURANTOIN MACROCRYSTAL 100 MG PO CAPS
100.0000 mg | ORAL_CAPSULE | Freq: Once | ORAL | Status: AC
  Administered 2019-08-31: 100 mg via ORAL
  Filled 2019-08-31: qty 2
  Filled 2019-08-31: qty 1

## 2019-08-31 MED ORDER — DOXYCYCLINE HYCLATE 100 MG PO TABS: 100.0000 mg | ORAL_TABLET | Freq: Once | ORAL | Status: DC

## 2019-08-31 MED ORDER — NITROFURANTOIN MONOHYD MACRO 100 MG PO CAPS
100.0000 mg | ORAL_CAPSULE | Freq: Two times a day (BID) | ORAL | 0 refills | Status: DC
Start: 2019-08-31 — End: 2019-09-26

## 2019-08-31 NOTE — Telephone Encounter (Signed)
Description   Continue to take 7.5 mg on Tuesday and Friday and continue to take 5 mg(2 pills) on every other day INR today is 2.4  (goal is 2-3) At goal Recheck in 5-6 weeks     Caryl Pina, MD [place] 08/31/2019, 1:13 PM

## 2019-08-31 NOTE — Telephone Encounter (Signed)
Fax received mdINR PT/INR self testing service Test date/time 08/30/19 852 pm INR 2.8

## 2019-08-31 NOTE — Discharge Instructions (Addendum)
Your evaluation today showed a possible urinary tract infection, and you have been given a prescription for an antibiotic.  Return if your symptoms are getting worse.

## 2019-08-31 NOTE — Telephone Encounter (Signed)
Patient's CNA made aware to take 7.5mg  on Tuesday and Friday. Patient is to take 5mg  (2 pills) the other days of the week. Aid knows that we need an INR recheck in 5-6 weeks.

## 2019-08-31 NOTE — ED Notes (Signed)
AC called, bringing med

## 2019-09-01 LAB — URINE CULTURE: Culture: <10000 — AB

## 2019-09-07 ENCOUNTER — Other Ambulatory Visit: Payer: Self-pay

## 2019-09-07 ENCOUNTER — Ambulatory Visit (INDEPENDENT_AMBULATORY_CARE_PROVIDER_SITE_OTHER): Payer: Medicare Other | Admitting: Urology

## 2019-09-07 VITALS — BP 96/41 | HR 75 | Temp 98.1°F | Ht 66.5 in | Wt 150.0 lb

## 2019-09-07 DIAGNOSIS — I130 Hypertensive heart and chronic kidney disease with heart failure and stage 1 through stage 4 chronic kidney disease, or unspecified chronic kidney disease: Secondary | ICD-10-CM | POA: Diagnosis not present

## 2019-09-07 DIAGNOSIS — C680 Malignant neoplasm of urethra: Secondary | ICD-10-CM

## 2019-09-07 DIAGNOSIS — J440 Chronic obstructive pulmonary disease with acute lower respiratory infection: Secondary | ICD-10-CM | POA: Diagnosis not present

## 2019-09-07 DIAGNOSIS — I5042 Chronic combined systolic (congestive) and diastolic (congestive) heart failure: Secondary | ICD-10-CM | POA: Diagnosis not present

## 2019-09-07 DIAGNOSIS — I4811 Longstanding persistent atrial fibrillation: Secondary | ICD-10-CM | POA: Diagnosis not present

## 2019-09-07 DIAGNOSIS — I70209 Unspecified atherosclerosis of native arteries of extremities, unspecified extremity: Secondary | ICD-10-CM | POA: Diagnosis not present

## 2019-09-07 DIAGNOSIS — N1831 Chronic kidney disease, stage 3a: Secondary | ICD-10-CM | POA: Diagnosis not present

## 2019-09-07 NOTE — Progress Notes (Signed)
Urological Symptom Review  Patient is experiencing the following symptoms: Hard to postpone urination Get up at night to urinate Leakage of urine   Review of Systems  Gastrointestinal (upper)  : Negative for upper GI symptoms  Gastrointestinal (lower) : Negative for lower GI symptoms  Constitutional : Negative for symptoms  Skin: Negative for skin symptoms  Eyes: Negative for eye symptoms  Ear/Nose/Throat : Negative for Ear/Nose/Throat symptoms  Hematologic/Lymphatic: Negative for Hematologic/Lymphatic symptoms  Cardiovascular : Negative for cardiovascular symptoms  Respiratory : Negative for respiratory symptoms  Endocrine: Negative for endocrine symptoms  Musculoskeletal: Negative for musculoskeletal symptoms  Neurological: Negative for neurological symptoms  Psychologic: Negative for psychiatric symptoms 

## 2019-09-07 NOTE — Progress Notes (Signed)
Surgical clearance sent via communications to Dr. Percival Spanish to hold warfarin prior to surgery.

## 2019-09-07 NOTE — Progress Notes (Signed)
Subjective:  1. 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  He last had treatment in 7/20 and has failed f/u because of intervening medical issues.  He had been on Myrbetriq for UUI and he is on nitrofurantoin for a UTI.   His UA had microhematuria and pyuria.  The culture was negative.      ROS:  ROS:  A complete review of systems was performed.  All systems are negative except for pertinent findings as noted.   ROS  Allergies  Allergen Reactions  . Rocephin [Ceftriaxone Sodium In Dextrose] Other (See Comments)    Recurrent seizures  . Lipitor [Atorvastatin] Other (See Comments)    Unknown rxn per pt    Outpatient Encounter Medications as of 09/07/2019  Medication Sig  . acetaminophen (TYLENOL) 325 MG tablet Take 2 tablets (650 mg total) by mouth every 6 (six) hours as needed for mild pain (or Fever >/= 101).  Marland Kitchen albuterol (PROVENTIL) (2.5 MG/3ML) 0.083% nebulizer solution Take 3 mLs (2.5 mg total) by nebulization every 6 (six) hours as needed for wheezing or shortness of breath.  . carvedilol (COREG) 6.25 MG tablet Take 1 tablet (6.25 mg total) by mouth 2 (two) times daily with a meal. (Needs to be seen before next refill)  . donepezil (ARICEPT) 10 MG tablet TAKE ONE TABLET AT BEDTIME (Patient taking differently: Take 10 mg by mouth at bedtime. )  . febuxostat (ULORIC) 40 MG tablet TAKE 1 TABLET DAILY (Patient taking differently: Take 40 mg by mouth in the morning. )  . fluticasone (FLONASE) 50 MCG/ACT nasal spray SPRAY 1 SPRAY IN EACH NOSTRIL ONCE DAILY. (Patient taking differently: Place 1 spray into both nostrils daily as needed for allergies or rhinitis. )  . furosemide (LASIX) 20 MG tablet Take 1 tablet (20 mg total) by mouth in the morning.  . gabapentin (NEURONTIN) 400 MG capsule Take 1 capsule (400 mg total) by mouth 2 (two) times daily.  . Ipratropium-Albuterol (COMBIVENT RESPIMAT)  20-100 MCG/ACT AERS respimat Inhale 1 puff into the lungs every 6 (six) hours as needed for wheezing or shortness of breath (As needed for wheezing , cough or shortness of breath). (Patient taking differently: Inhale 1 puff into the lungs every 6 (six) hours as needed for wheezing or shortness of breath. )  . isosorbide-hydrALAZINE (BIDIL) 20-37.5 MG tablet Take 1 tablet by mouth 2 (two) times daily.  Marland Kitchen MYRBETRIQ 25 MG TB24 tablet Take 25 mg by mouth every morning.   . nitrofurantoin, macrocrystal-monohydrate, (MACROBID) 100 MG capsule Take 1 capsule (100 mg total) by mouth 2 (two) times daily. X 7 days  . Nutritional Supplements (ENSURE NUTRITION SHAKE) LIQD Take 1 Bottle by mouth 4 (four) times daily. 1 bottle of chocolate flavored Ensure up to 4 times a day--give with ice-please  . pantoprazole (PROTONIX) 40 MG tablet TAKE 1 TABLET DAILY (Patient taking differently: Take 40 mg by mouth in the morning. )  . sertraline (ZOLOFT) 50 MG tablet TAKE 1 TABLET IN THE MORNING (Patient taking differently: Take 50 mg by mouth in the morning. )  . simvastatin (ZOCOR) 10 MG tablet TAKE 1 TABLET ONCE DAILY IN THE EVENING (Patient taking differently: Take 10 mg by mouth at bedtime. )  . warfarin (COUMADIN) 2.5 MG tablet Take 1 tablet (2.5 mg total) by mouth every evening. (Patient taking differently: Take 2.5 mg by mouth every Tuesday,  Thursday, Saturday, and Sunday. morning)   No facility-administered encounter medications on file as of 09/07/2019.    Past Medical History:  Diagnosis Date  . Arthritis    "all over"  . Atrial fibrillation ([place])   . Benign localized prostatic hyperplasia with lower urinary tract symptoms (LUTS)   . Bradycardia 11/28/2015   Severe-HR in the 20s to 30s following intubation for urological procedure.  Marland Kitchen CAD- non obstructive disease by cath 3/14 cardiologist-  dr hochrein  . Chronic lower back pain   . COPD (chronic obstructive pulmonary disease) ([place])   . DDD (degenerative disc  disease)   . Depression   . Dyspnea    w/ exertion and lying down (raise head)  . ETOH abuse   . GERD (gastroesophageal reflux disease)   . Glaucoma   . Glaucoma, both eyes   . History of bladder cancer urologist-- dr Jeffie Pollock   first dx 2012--  s/p TURBT's  . History of DVT of lower extremity 03/2013   bilateral   . History of pulmonary embolus (PE) 04/2013  . HTN (hypertension)   . Hyperlipidemia   . Hypertension   . LBBB (left bundle branch block)    chronic  . Non-ischemic cardiomyopathy ([place])    last echo 01-18-2017, ef 35-40%  . NSVT (nonsustained ventricular tachycardia) ([place])    a. NSVT 06/2012; NSVT also seen during 03/2013 adm. b. Med rx. Not candidate for ICD given adv age.  Marland Kitchen PAD (peripheral artery disease) ([place])   . 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 PVCs (premature ventricular contractions)   . PVD (peripheral vascular disease) ([facility])    Right ABI .75, Left .78 (2006)  . Syncope 06/2012   a. Felt to be postural syncope related to diuretics 06/2012.  Marland Kitchen Systolic and diastolic CHF, chronic (HCC) cardiologsit-  dr hochrein   a. NICM - patent cors 06/2012, EF 40% at that time. b. 03/2013 eval: EF 20-25%.  . Vision loss, left eye    due to glaucoma  . Wears glasses     Past Surgical History:  Procedure Laterality Date  . AMPUTATION Left 10/16/2014   Procedure: AMPUTATION ABOVE KNEE- LEFT;  Surgeon: Mal Misty, MD;  Location: [facility];  Service: Vascular;  Laterality: Left;  . CARDIAC CATHETERIZATION  05-11-2004  DR [facility]   MINIMAL CORANARY PLAQUE/ NORMAL LVF/ EF 55%/  NON-OBSTRUCTIVE LAD 25%  . CARDIAC CATHETERIZATION  06/18/2012   dr hochrein   mild luminal irregularities of coronaries, ef 45-50%  . CARDIOVASCULAR STRESS TEST  10-15-2010   LOW RISK NUCLEAR STUDY/ NO EVIDENCE OF ISCHEMIA/ NORMAL EF  . CATARACT EXTRACTION W/ INTRAOCULAR LENS  IMPLANT, BILATERAL Bilateral    . CYSTOSCOPY  10/07/2011   Procedure: CYSTOSCOPY;  Surgeon: Malka So, MD;  Location: [facility];  Service: Urology;  Laterality: N/A;  . CYSTOSCOPY N/A 10/20/2018   Procedure: CYSTOSCOPY WITH FULGURATION OF PROSTATIC URETHRAL TUMOR;  Surgeon: [name], MD;  Location: [facility];  Service: Urology;  Laterality: N/A;  . CYSTOSCOPY WITH BIOPSY  03/14/2012   Procedure: CYSTOSCOPY WITH BIOPSY;  Surgeon: Malka So, MD;  Location: [facility];  Service: Urology;  Laterality: N/A;  WITH FULGURATION  . CYSTOSCOPY WITH BIOPSY N/A 12/09/2016   Procedure: CYSTOSCOPY WITH BIOPSY AND FULGURATION;  Surgeon: [name], MD;  Location: [facility];  Service: Urology;  Laterality: N/A;  . CYSTOSCOPY WITH BIOPSY  N/A 12/20/2017   Procedure: CYSTOSCOPY WITH BIOPSY WITH FULGURATION;  Surgeon: [name], MD;  Location: [facility];  Service: Urology;  Laterality: N/A;  . CYSTOSCOPY WITH BIOPSY N/A 06/09/2018   Procedure: CYSTOSCOPY WITH BIOPSY AND FULGURATION;  Surgeon: [name], MD;  Location: [facility];  Service: Urology;  Laterality: N/A;  . CYSTOSCOPY WITH FULGERATION N/A 01/20/2016   Procedure: CYSTOSCOPY, BIOPSY WITH FULGERATION OF URTHRAL TUMOR;  Surgeon: [name], MD;  Location[facility];  Service: Urology;  Laterality: N/A;  . CYSTOSCOPY WITH FULGERATION N/A 06/01/2016   Procedure: CYSTOSCOPY WITH FULGERATION urethral tumors and bladder neck;  Surgeon: [name], MD;  Location: [facility];  Service: Urology;  Laterality: N/A;  . SHOULDER SURGERY Left 1970's  . TONSILLECTOMY    . TRANSTHORACIC ECHOCARDIOGRAM  01-18-2017   dr hochrein   moderate LVH, ef 35-40%, diffuse hypokinesis/  trivial AR and MR/  mild TR  . TRANSURETHRAL RESECTION OF BLADDER TUMOR  10/07/2011   Procedure: TRANSURETHRAL RESECTION OF BLADDER TUMOR (TURBT);  Surgeon: Malka So, MD;  Location: [facility];  Service: Urology;  Laterality: N/A;  . TRANSURETHRAL RESECTION OF BLADDER TUMOR N/A 07/10/2015   Procedure:  TRANSURETHRAL RESECTION OF BLADDER TUMOR (TURBT), CYSTOSCOPY ;  Surgeon: [name], MD;  Location[facility];  Service: Urology;  Laterality: N/A;  . TRANSURETHRAL RESECTION OF PROSTATE  03/14/2012   Procedure: TRANSURETHRAL RESECTION OF THE PROSTATE WITH GYRUS INSTRUMENTS;  Surgeon: Malka So, MD;  Location: [facility];  Service: Urology;  Laterality: N/A;    Social History   Socioeconomic History  . Marital status: Married    Spouse name: Not on file  . Number of children: 1  . Years of education: Not on file  . Highest education level: Not on file  Occupational History  . Occupation: Retired  Tobacco Use  . Smoking status: Former Smoker    Packs/day: 1.00    Years: 35.00    Pack years: 35.00    Types: Cigarettes    Start date: 04/12/1942    Quit date: 04/13/1975    Years since quitting: 44.4  . Smokeless tobacco: Never Used  Substance and Sexual Activity  . Alcohol use: Not Currently    Alcohol/week: 7.0 standard drinks    Types: 7 Cans of beer per week  . Drug use: No  . Sexual activity: Never  Other Topics Concern  . Not on file  Social History Narrative   Lives in [place] with spouse who is s/p spinal cord injury.  He has one grown son who lives in [place].  He is retired but worked for [employer] for years and lives in [place].   Social Determinants of Health   Financial Resource Strain:   . Difficulty of Paying Living Expenses:   Food Insecurity:   . Worried About Charity fundraiser in the Last Year:   . Arboriculturist in the Last Year:   Transportation Needs:   . Film/video editor (Medical):   Marland Kitchen Lack of Transportation (Non-Medical):   Physical Activity:   . Days of Exercise per Week:   . Minutes of Exercise per Session:   Stress:   . Feeling of Stress :   Social Connections:   . Frequency of Communication with Friends and Family:   . Frequency of Social Gatherings with Friends and Family:   . Attends Religious Services:   . Active Member of Clubs  or Organizations:   . Attends  Club or Organization Meetings:   Marland Kitchen Marital Status:   Intimate Partner Violence:   . Fear of Current or Ex-Partner:   . Emotionally Abused:   Marland Kitchen Physically Abused:   . Sexually Abused:     Family History  Problem Relation Age of Onset  . Hypertension Mother   . Arthritis Mother   . Lung cancer Sister   . Deep vein thrombosis Father   . Early death Brother        Objective: Vitals:   09/07/19 1311  BP: (!) 96/41  Pulse: 75  Temp: 98.1 F (36.7 C)     Physical Exam Vitals reviewed.  Constitutional:      Appearance: Normal appearance.  Cardiovascular:     Rate and Rhythm: Normal rate. Rhythm irregular.  Pulmonary:     Effort: Pulmonary effort is normal. No respiratory distress.     Breath sounds: Normal breath sounds.  Neurological:     Mental Status: He is alert.     Lab Results:  No results found. However, due to the size of the patient record, not all encounters were searched. Please check Results Review for a complete set of results.  BMET No results for input(s): NA, K, CL, CO2, GLUCOSE, BUN, CREATININE, CALCIUM in the last 72 hours. PSA PSA  Date Value Ref Range Status  04/11/2015 1.08 0.00 - 4.00 ng/mL Final    Comment:    (NOTE) While PSA levels of <=4.0 ng/ml are reported as reference range, some men with levels below 4.0 ng/ml can have prostate cancer and many men with PSA above 4.0 ng/ml do not have prostate cancer.  Other tests such as free PSA, age specific reference ranges, PSA velocity and PSA doubling time may be helpful especially in men less than 46 years old. Performed at [facility]   12/25/2010 1.49 <=4.00 ng/mL Final    Comment:    Test Methodology: ECLIA PSA (Electrochemiluminescence Immunoassay)   No results found for: TESTOSTERONE    Studies/Results: Cystoscopy: He was prepped with Betadine solution and his urethra was then instilled with 2% lidocaine jelly.  The flexible scope was  passed per urethra.  Examination revealed a normal urethra with exception of very mild bulbar stricturing with some mild inflammatory changes.  The external sphincter was intact.  The prostatic urethra had evidence of prior resection.  There was a small recurrent tumor anteriorly at the apex of the prostatic urethra.  A slightly larger tumor in the left mid prostatic urethra and circumferential papillary fronds at the bladder neck.  No tumors were noted on the bladder wall.  He had mild trabeculation and the ureteral orifices were in the normal anatomic position.  There were no complications.    Assessment & Plan: Recurrent prostatic urethral tumors.  We discussed observation vs surgical management and they would like to consider surgical management if we can get medical clearance.   He was previously done under [facility] and we could try that again.  The risks were reviewed in detail.     No orders of the defined types were placed in this encounter.    Orders Placed This Encounter  Procedures  . POCT urinalysis dipstick  . PR CYSTOURETHROSCOPY      Return for Next available, I will be placing a request to post for surgery after cardiac clearance. .   CC: Dettinger, Fransisca Kaufmann, MD      [name] 09/07/2019

## 2019-09-11 ENCOUNTER — Telehealth: Payer: Self-pay

## 2019-09-11 DIAGNOSIS — J440 Chronic obstructive pulmonary disease with acute lower respiratory infection: Secondary | ICD-10-CM | POA: Diagnosis not present

## 2019-09-11 DIAGNOSIS — I70209 Unspecified atherosclerosis of native arteries of extremities, unspecified extremity: Secondary | ICD-10-CM | POA: Diagnosis not present

## 2019-09-11 DIAGNOSIS — I4811 Longstanding persistent atrial fibrillation: Secondary | ICD-10-CM | POA: Diagnosis not present

## 2019-09-11 DIAGNOSIS — I130 Hypertensive heart and chronic kidney disease with heart failure and stage 1 through stage 4 chronic kidney disease, or unspecified chronic kidney disease: Secondary | ICD-10-CM | POA: Diagnosis not present

## 2019-09-11 DIAGNOSIS — N1831 Chronic kidney disease, stage 3a: Secondary | ICD-10-CM | POA: Diagnosis not present

## 2019-09-11 DIAGNOSIS — I5042 Chronic combined systolic (congestive) and diastolic (congestive) heart failure: Secondary | ICD-10-CM | POA: Diagnosis not present

## 2019-09-11 NOTE — Telephone Encounter (Signed)
Fax received mdINR PT/INR self testing service Test date/time 09/11/2019 10:12 am INR 2.5  INR Range 2 - 3

## 2019-09-11 NOTE — Telephone Encounter (Signed)
INR therapeutic. Continue current regimen. Recheck in 1 week 

## 2019-09-11 NOTE — Telephone Encounter (Signed)
Pt's caregiver aware of provider feedback and voiced understanding.

## 2019-09-13 ENCOUNTER — Telehealth: Payer: Self-pay | Admitting: Family Medicine

## 2019-09-13 ENCOUNTER — Other Ambulatory Visit: Payer: Self-pay | Admitting: *Deleted

## 2019-09-13 ENCOUNTER — Other Ambulatory Visit: Payer: Self-pay | Admitting: Family Medicine

## 2019-09-13 ENCOUNTER — Telehealth: Payer: Self-pay

## 2019-09-13 MED ORDER — WARFARIN SODIUM 5 MG PO TABS
5.0000 mg | ORAL_TABLET | Freq: Every day | ORAL | 3 refills | Status: DC
Start: 2019-09-13 — End: 2019-12-19

## 2019-09-13 MED ORDER — WARFARIN SODIUM 2.5 MG PO TABS: 2.5000 mg | ORAL_TABLET | Freq: Every evening | ORAL | 2 refills | Status: DC

## 2019-09-13 MED ORDER — DONEPEZIL HCL 10 MG PO TABS: 10.0000 mg | ORAL_TABLET | Freq: Every day | ORAL | 1 refills | Status: DC

## 2019-09-13 NOTE — Telephone Encounter (Signed)
[place] aware of dosage of coumadin

## 2019-09-13 NOTE — Telephone Encounter (Signed)
Warfarin 5mg  & warfarin 2.5mg  need to be re sent to [facility].  5mg  is no longer on chart.   Per chart patient should be taking 7.5mg  Tuesday & Friday and 5mg  other days.  Advise if this is correct and send both rx's with correct instructions.

## 2019-09-13 NOTE — Telephone Encounter (Signed)
Rx fixed and re sent to pharmacy.

## 2019-09-13 NOTE — Telephone Encounter (Signed)
Noted- pharmacy previously aware that both rx would be sent by provider.

## 2019-09-13 NOTE — Telephone Encounter (Signed)
Sent prescription for both for the patient

## 2019-09-14 ENCOUNTER — Telehealth: Payer: Self-pay

## 2019-09-14 ENCOUNTER — Other Ambulatory Visit: Payer: Self-pay | Admitting: *Deleted

## 2019-09-14 MED ORDER — SERTRALINE HCL 50 MG PO TABS: 50.0000 mg | ORAL_TABLET | Freq: Every morning | ORAL | 2 refills | Status: DC

## 2019-09-14 NOTE — Telephone Encounter (Signed)
I called and spoke with Son about upcoming procedure. Understands to hold warfarin 3 days prior to surgery and to have pt see Dr. Warrick Parisian to follow INR. Son voiced understanding.

## 2019-09-17 ENCOUNTER — Telehealth: Payer: Self-pay | Admitting: *Deleted

## 2019-09-17 DIAGNOSIS — I4891 Unspecified atrial fibrillation: Secondary | ICD-10-CM

## 2019-09-17 DIAGNOSIS — Z86711 Personal history of pulmonary embolism: Secondary | ICD-10-CM

## 2019-09-17 DIAGNOSIS — I4821 Permanent atrial fibrillation: Secondary | ICD-10-CM | POA: Diagnosis not present

## 2019-09-17 NOTE — Telephone Encounter (Signed)
Description   Continue to take 7.5 mg on Tuesday and Friday and continue to take 5 mg(2 pills) on every other day INR today is 2.5  (goal is 2-3) At goal Recheck in 5-6 weeks      I do not know when he supposed for his surgery but I thought that was coming up soon but just make sure he follows their protocol and when he is supposed to stop it prior to his surgery. Caryl Pina, MD [facility] 09/17/2019, 2:42 PM

## 2019-09-17 NOTE — Telephone Encounter (Signed)
Aware of dosage  

## 2019-09-17 NOTE — Telephone Encounter (Signed)
Fax received mdINR PT/INR self testing service Test date/time 09/17/19 121 pm INR 2.5

## 2019-10-01 ENCOUNTER — Emergency Department (HOSPITAL_COMMUNITY)
Admission: EM | Admit: 2019-10-01 | Discharge: 2019-10-02 | Disposition: A | Discharge status: Home / Self Care | Payer: Medicare Other | Attending: Emergency Medicine | Admitting: Emergency Medicine

## 2019-10-01 ENCOUNTER — Encounter (HOSPITAL_COMMUNITY): Payer: Self-pay | Admitting: *Deleted

## 2019-10-01 ENCOUNTER — Other Ambulatory Visit: Payer: Self-pay

## 2019-10-01 ENCOUNTER — Telehealth: Payer: Self-pay

## 2019-10-01 ENCOUNTER — Telehealth: Payer: Self-pay | Admitting: *Deleted

## 2019-10-01 DIAGNOSIS — J449 Chronic obstructive pulmonary disease, unspecified: Secondary | ICD-10-CM | POA: Insufficient documentation

## 2019-10-01 DIAGNOSIS — I4891 Unspecified atrial fibrillation: Secondary | ICD-10-CM

## 2019-10-01 DIAGNOSIS — Z86711 Personal history of pulmonary embolism: Secondary | ICD-10-CM

## 2019-10-01 DIAGNOSIS — Z87891 Personal history of nicotine dependence: Secondary | ICD-10-CM | POA: Insufficient documentation

## 2019-10-01 DIAGNOSIS — R319 Hematuria, unspecified: Secondary | ICD-10-CM | POA: Diagnosis present

## 2019-10-01 DIAGNOSIS — Z881 Allergy status to other antibiotic agents status: Secondary | ICD-10-CM | POA: Insufficient documentation

## 2019-10-01 DIAGNOSIS — I119 Hypertensive heart disease without heart failure: Secondary | ICD-10-CM | POA: Diagnosis not present

## 2019-10-01 DIAGNOSIS — R791 Abnormal coagulation profile: Secondary | ICD-10-CM | POA: Diagnosis not present

## 2019-10-01 DIAGNOSIS — I2510 Atherosclerotic heart disease of native coronary artery without angina pectoris: Secondary | ICD-10-CM | POA: Insufficient documentation

## 2019-10-01 DIAGNOSIS — R310 Gross hematuria: Secondary | ICD-10-CM | POA: Diagnosis not present

## 2019-10-01 DIAGNOSIS — I130 Hypertensive heart and chronic kidney disease with heart failure and stage 1 through stage 4 chronic kidney disease, or unspecified chronic kidney disease: Secondary | ICD-10-CM | POA: Insufficient documentation

## 2019-10-01 DIAGNOSIS — I5042 Chronic combined systolic (congestive) and diastolic (congestive) heart failure: Secondary | ICD-10-CM | POA: Insufficient documentation

## 2019-10-01 DIAGNOSIS — Z79899 Other long term (current) drug therapy: Secondary | ICD-10-CM | POA: Diagnosis not present

## 2019-10-01 DIAGNOSIS — G309 Alzheimer's disease, unspecified: Secondary | ICD-10-CM | POA: Diagnosis not present

## 2019-10-01 DIAGNOSIS — N1831 Chronic kidney disease, stage 3a: Secondary | ICD-10-CM | POA: Insufficient documentation

## 2019-10-01 DIAGNOSIS — Z8551 Personal history of malignant neoplasm of bladder: Secondary | ICD-10-CM | POA: Insufficient documentation

## 2019-10-01 LAB — CBC WITH DIFFERENTIAL/PLATELET
Abs Immature Granulocytes: 0.01 10*3/uL (ref 0.00–0.07)
Basophils Absolute: 0 10*3/uL (ref 0.0–0.1)
Basophils Relative: 0 %
Eosinophils Absolute: 0.1 10*3/uL (ref 0.0–0.5)
Eosinophils Relative: 2 %
HCT: 40.4 % (ref 39.0–52.0)
Hemoglobin: 12.3 g/dL — ABNORMAL LOW (ref 13.0–17.0)
Immature Granulocytes: 0 %
Lymphocytes Relative: 25 %
Lymphs Abs: 1.2 10*3/uL (ref 0.7–4.0)
MCH: 27.3 pg (ref 26.0–34.0)
MCHC: 30.4 g/dL (ref 30.0–36.0)
MCV: 89.8 fL (ref 80.0–100.0)
Monocytes Absolute: 0.4 10*3/uL (ref 0.1–1.0)
Monocytes Relative: 9 %
Neutro Abs: 3.1 10*3/uL (ref 1.7–7.7)
Neutrophils Relative %: 64 %
Platelets: 175 10*3/uL (ref 150–400)
RBC: 4.5 MIL/uL (ref 4.22–5.81)
RDW: 16 % — ABNORMAL HIGH (ref 11.5–15.5)
WBC: 4.8 10*3/uL (ref 4.0–10.5)
nRBC: 0 % (ref 0.0–0.2)

## 2019-10-01 LAB — URINALYSIS, ROUTINE W REFLEX MICROSCOPIC
Bilirubin Urine: NEGATIVE
Glucose, UA: NEGATIVE mg/dL
Ketones, ur: NEGATIVE mg/dL
Leukocytes,Ua: NEGATIVE
Nitrite: NEGATIVE
Protein, ur: 100 mg/dL — AB
RBC / HPF: >50 RBC/hpf — ABNORMAL HIGH (ref 0–5)
Specific Gravity, Urine: 1.011 (ref 1.005–1.030)
pH: 6 (ref 5.0–8.0)

## 2019-10-01 LAB — BASIC METABOLIC PANEL
Anion gap: 9 (ref 5–15)
BUN: 29 mg/dL — ABNORMAL HIGH (ref 8–23)
CO2: 28 mmol/L (ref 22–32)
Calcium: 9.4 mg/dL (ref 8.9–10.3)
Chloride: 108 mmol/L (ref 98–111)
Creatinine, Ser: 1.15 mg/dL (ref 0.61–1.24)
GFR calc Af Amer: >60 mL/min (ref >60)
GFR calc non Af Amer: 53 mL/min — ABNORMAL LOW (ref >60)
Glucose, Bld: 83 mg/dL (ref 70–99)
Potassium: 4.3 mmol/L (ref 3.5–5.1)
Sodium: 145 mmol/L (ref 135–145)

## 2019-10-01 LAB — PROTIME-INR
INR: 3.6 — ABNORMAL HIGH (ref 0.8–1.2)
Prothrombin Time: 35.1 seconds — ABNORMAL HIGH (ref 11.4–15.2)

## 2019-10-01 MED ORDER — PHYTONADIONE 5 MG PO TABS
2.5000 mg | ORAL_TABLET | Freq: Once | ORAL | Status: AC
  Administered 2019-10-01: 2.5 mg via ORAL
  Filled 2019-10-01: qty 1

## 2019-10-01 NOTE — Discharge Instructions (Addendum)
As discussed, stop taking your Coumadin as your INR is elevated tonight at 3.6.  You are being asked to hold this until you have your surgery on July 1.  However, as discussed if you continue to have blood in your urine by this Friday morning, contact your urologist for further instructions.  You may need to have your surgery moved up if going off the Coumadin does not stop the bleeding in your bladder.  Also get rechecked immediately by returning here if you develop any weakness, dizziness or worsening blood in your urine.

## 2019-10-01 NOTE — Telephone Encounter (Signed)
Fax received mdINR PT/INR self testing service Test date/time 10/01/19 150 pm INR 2.4

## 2019-10-01 NOTE — Telephone Encounter (Signed)
Reviewed note with family.

## 2019-10-01 NOTE — ED Notes (Signed)
Pt given 2 sprites to drink.

## 2019-10-01 NOTE — Telephone Encounter (Signed)
Continue to take 7.5 mg on Tuesday and Friday and continue to take 5 mg(2 pills) on every other day INR today is 2.4  (goal is 2-3) At goal Recheck in 5-6 weeks

## 2019-10-01 NOTE — Telephone Encounter (Signed)
Pts son called concerned about dark urine for about 10 days. He is not sure if pt should go to the ER or not. Pt is on Coumadin, h/o incontinence, bladder cancer. Pt does not c/o pai, is not disoriented more than normal, no fever.  Advised son that he does need to be evaluated. Do not think that it is an emergency. Offered an appt for after hours but he would like for pt to be seen in the office. Osa Craver had an appt on 10/03/19 but son did not want to wait until then.  Son will take the patient to the ER.

## 2019-10-01 NOTE — ED Provider Notes (Signed)
[facility] Provider Note   CSN: [id number] Arrival date & time: 10/01/19  1700     History Chief Complaint  Patient presents with  . Hematuria    Scott [name] is a 86 y.o. male with a history of atrial fibrillation and history of DVT on chronic Coumadin therapy, CAD, hypertension and history of bladder cancer pending urology procedure on July 1 to remove additional bladder tumors which have redeveloped.  Patient presents tonight secondary to about a 10-day history of dark urine production felt to be bloody from these bleeding tumors.  In addition he has bladder fullness and difficulty urinating at this time.  He states he has passed some blood clots in the past several days.  He has had no fevers or chills.  He does have urinary urgency at baseline.  He denies back or flank pain.  He and son at bedside are concerned about blood loss and whether his Coumadin level is supratherapeutic.  The history is provided by the patient.       Past Medical History:  Diagnosis Date  . Arthritis    "all over"  . Atrial fibrillation ([place])   . Benign localized prostatic hyperplasia with lower urinary tract symptoms (LUTS)   . Bradycardia 11/28/2015   Severe-HR in the 20s to 30s following intubation for urological procedure.  Marland Kitchen CAD- non obstructive disease by cath 3/14 cardiologist-  dr hochrein  . Chronic lower back pain   . COPD (chronic obstructive pulmonary disease) ([employer])   . DDD (degenerative disc disease)   . Depression   . Dyspnea    w/ exertion and lying down (raise head)  . ETOH abuse   . GERD (gastroesophageal reflux disease)   . Glaucoma   . Glaucoma, both eyes   . History of bladder cancer urologist-- dr Jeffie Pollock   first dx 2012--  s/p TURBT's  . History of DVT of lower extremity 03/2013   bilateral   . History of pulmonary embolus (PE) 04/2013  . HTN (hypertension)   . Hyperlipidemia   . Hypertension   . LBBB (left bundle branch block)    chronic  .  Non-ischemic cardiomyopathy ([place])    last echo 01-18-2017, ef 35-40%  . NSVT (nonsustained ventricular tachycardia) ([place])    a. NSVT 06/2012; NSVT also seen during 03/2013 adm. b. Med rx. Not candidate for ICD given adv age.  Marland Kitchen PAD (peripheral artery disease) ([place])   . 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 PVCs (premature ventricular contractions)   . PVD (peripheral vascular disease) (HCC)    Right ABI .75, Left .78 (2006)  . Syncope 06/2012   a. Felt to be postural syncope related to diuretics 06/2012.  Marland Kitchen Systolic and diastolic CHF, chronic (HCC) cardiologsit-  dr hochrein   a. NICM - patent cors 06/2012, EF 40% at that time. b. 03/2013 eval: EF 20-25%.  . Vision loss, left eye    due to glaucoma  . Wears glasses     Patient Active Problem List   Diagnosis Date Noted  . POLST (Physician Orders for Life-Sustaining Treatment)   . Goals of care, counseling/discussion   . Palliative care encounter   . UTI (urinary tract infection) 07/02/2019  . CAP (community acquired pneumonia) 07/01/2019  . Acute combined systolic (congestive) and diastolic (congestive) heart failure ([place]) 07/01/2019  . Foot pain 12/21/2018  . Cellulitis 12/21/2018  . CKD (  chronic kidney disease) stage 3, GFR 30-59 ml/min 12/11/2018  . Dyspnea 12/10/2018  . Alzheimer disease ([place]) 09/07/2018  . SIRS (systemic inflammatory response syndrome) ([place]) 06/22/2018  . Paroxysmal atrial fibrillation ([place]) 01/18/2017  . PVC (pulmonary venous congestion) 01/18/2017  . Thoracic aortic atherosclerosis ([place]) 11/26/2016  . Thrombocytopenia ([place]) 08/04/2016  . Chronic kidney disease (CKD) stage G3a/A1, moderately decreased glomerular filtration rate (GFR) between 45-59 mL/min/1.73 square meter and albuminuria creatinine ratio less than 30 mg/g (HCC) 08/04/2016  . Essential hypertension 11/29/2015  . Bradycardia 11/28/2015  . Malignant  neoplasm of lateral wall of urinary bladder ([place]) 07/10/2015  . Status post above-knee amputation of left lower extremity ([place]) 10/18/2014  . Tachyarrhythmia 10/05/2014  . Aspiration pneumonia ([place]) 10/05/2014  . Sepsis due to other etiology ([place]) 10/03/2014  . Atherosclerotic PVD with ulceration ([place]) 10/03/2014  . Atrial fibrillation with RVR ([place]) 10/03/2014  . Hyperkalemia 10/03/2014  . PAD (peripheral artery disease) ([place]) 09/24/2014  . NSTEMI (non-ST elevated myocardial infarction) ([place]) 10/09/2013  . High risk medication use 05/17/2013  . BPH (benign prostatic hyperplasia) 05/17/2013  . Vitamin D deficiency 05/17/2013  . History of pulmonary embolism (2014) 04/17/2013  . NSVT (nonsustained ventricular tachycardia)- not an ICD candidate 04/17/2013  . NICM (nonischemic cardiomyopathy)- EF 20-25% by Echo 04/12/13 04/17/2013  . Chronic combined systolic and diastolic heart failure ([place]) 03/15/2013  . DVT of lower extremity, bilateral ([place]) 03/15/2013  . Constipation 11/02/2012  . Syncope 06/21/2012  . Bladder cancer ([place]) 03/15/2012  . Benign localized hyperplasia of prostate with urinary obstruction 03/15/2012  . Hyperlipidemia   . PVCs (premature ventricular contractions)   . CAD- non obstructive disease by cath 3/14     Past Surgical History:  Procedure Laterality Date  . AMPUTATION Left 10/16/2014   Procedure: AMPUTATION ABOVE KNEE- LEFT;  Surgeon: Mal Misty, MD;  Location: [facility];  Service: Vascular;  Laterality: Left;  . CARDIAC CATHETERIZATION  05-11-2004  DR [facility]   MINIMAL CORANARY PLAQUE/ NORMAL LVF/ EF 55%/  NON-OBSTRUCTIVE LAD 25%  . CARDIAC CATHETERIZATION  06/18/2012   dr hochrein   mild luminal irregularities of coronaries, ef 45-50%  . CARDIOVASCULAR STRESS TEST  10-15-2010   LOW RISK NUCLEAR STUDY/ NO EVIDENCE OF ISCHEMIA/ NORMAL EF  . CATARACT EXTRACTION W/ INTRAOCULAR LENS  IMPLANT, BILATERAL Bilateral   . CYSTOSCOPY  10/07/2011   Procedure: CYSTOSCOPY;   Surgeon: Malka So, MD;  Location: [facility];  Service: Urology;  Laterality: N/A;  . CYSTOSCOPY N/A 10/20/2018   Procedure: CYSTOSCOPY WITH FULGURATION OF PROSTATIC URETHRAL TUMOR;  Surgeon: [name], MD;  Location: [facility];  Service: Urology;  Laterality: N/A;  . CYSTOSCOPY WITH BIOPSY  03/14/2012   Procedure: CYSTOSCOPY WITH BIOPSY;  Surgeon: Malka So, MD;  Location: [facility];  Service: Urology;  Laterality: N/A;  WITH FULGURATION  . CYSTOSCOPY WITH BIOPSY N/A 12/09/2016   Procedure: CYSTOSCOPY WITH BIOPSY AND FULGURATION;  Surgeon: [name], MD;  Location: [facility];  Service: Urology;  Laterality: N/A;  . CYSTOSCOPY WITH BIOPSY N/A 12/20/2017   Procedure: CYSTOSCOPY WITH BIOPSY WITH FULGURATION;  Surgeon: [name], MD;  Location: [facility];  Service: Urology;  Laterality: N/A;  . CYSTOSCOPY WITH BIOPSY N/A 06/09/2018   Procedure: CYSTOSCOPY WITH BIOPSY AND FULGURATION;  Surgeon: [name], MD;  Location: [facility];  Service: Urology;  Laterality: N/A;  . CYSTOSCOPY WITH FULGERATION N/A 01/20/2016   Procedure: CYSTOSCOPY, BIOPSY WITH FULGERATION OF [place] TUMOR;  Surgeon: [name],  MD;  Location: [facility];  Service: Urology;  Laterality: N/A;  . CYSTOSCOPY WITH FULGERATION N/A 06/01/2016   Procedure: CYSTOSCOPY WITH FULGERATION urethral tumors and bladder neck;  Surgeon: [name], MD;  Location: [facility];  Service: Urology;  Laterality: N/A;  . SHOULDER SURGERY Left 1970's  . TONSILLECTOMY    . TRANSTHORACIC ECHOCARDIOGRAM  01-18-2017   dr hochrein   moderate LVH, ef 35-40%, diffuse hypokinesis/  trivial AR and MR/  mild TR  . TRANSURETHRAL RESECTION OF BLADDER TUMOR  10/07/2011   Procedure: TRANSURETHRAL RESECTION OF BLADDER TUMOR (TURBT);  Surgeon: Malka So, MD;  Location: [facility];  Service: Urology;  Laterality: N/A;  . TRANSURETHRAL RESECTION OF BLADDER TUMOR N/A 07/10/2015   Procedure: TRANSURETHRAL RESECTION OF BLADDER TUMOR (TURBT), CYSTOSCOPY ;   Surgeon: [name], MD;  Location: [facility];  Service: Urology;  Laterality: N/A;  . TRANSURETHRAL RESECTION OF PROSTATE  03/14/2012   Procedure: TRANSURETHRAL RESECTION OF THE PROSTATE WITH GYRUS INSTRUMENTS;  Surgeon: Malka So, MD;  Location: [facility];  Service: Urology;  Laterality: N/A;       Family History  Problem Relation Age of Onset  . Hypertension Mother   . Arthritis Mother   . Lung cancer Sister   . Deep vein thrombosis Father   . Early death Brother     Social History   Tobacco Use  . Smoking status: Former Smoker    Packs/day: 1.00    Years: 35.00    Pack years: 35.00    Types: Cigarettes    Start date: 04/12/1942    Quit date: 04/13/1975    Years since quitting: 44.4  . Smokeless tobacco: Never Used  Vaping Use  . Vaping Use: Never used  Substance Use Topics  . Alcohol use: Not Currently    Alcohol/week: 7.0 standard drinks    Types: 7 Cans of beer per week  . Drug use: No    Home Medications Prior to Admission medications   Medication Sig Start Date End Date Taking? Authorizing Provider  acetaminophen (TYLENOL) 325 MG tablet Take 2 tablets (650 mg total) by mouth every 6 (six) hours as needed for mild pain (or Fever >/= 101). 12/26/18   Elgergawy, Silver Huguenin, MD  albuterol (PROVENTIL) (2.5 MG/3ML) 0.083% nebulizer solution Take 3 mLs (2.5 mg total) by nebulization every 6 (six) hours as needed for wheezing or shortness of breath. Patient taking differently: Take 2.5 mg by nebulization daily. After bath 07/09/19   Roxan Hockey, MD  albuterol (VENTOLIN HFA) 108 (90 Base) MCG/ACT inhaler Inhale 2 puffs into the lungs daily. After bath    [provider]  Albuterol Sulfate (PROAIR HFA IN) Inhale 2 puffs into the lungs daily as needed (Shortness of breath).    [provider]  carvedilol (COREG) 6.25 MG tablet Take 1 tablet (6.25 mg total) by mouth 2 (two) times daily with a meal. (Needs to be seen before next refill) Patient taking differently:  Take 6.25 mg by mouth 2 (two) times daily with a meal.  07/24/19   Dettinger, Fransisca Kaufmann, MD  donepezil (ARICEPT) 10 MG tablet Take 1 tablet (10 mg total) by mouth at bedtime. Patient taking differently: Take 10 mg by mouth daily.  09/13/19   Dettinger, Fransisca Kaufmann, MD  febuxostat (ULORIC) 40 MG tablet TAKE 1 TABLET DAILY Patient taking differently: Take 40 mg by mouth daily.  09/14/19   Dettinger, Fransisca Kaufmann, MD  fluticasone (FLONASE) 50 MCG/ACT nasal spray SPRAY  1 SPRAY IN EACH NOSTRIL ONCE DAILY. Patient taking differently: Place 1 spray into both nostrils daily as needed for allergies or rhinitis.  08/29/19   Dettinger, Fransisca Kaufmann, MD  furosemide (LASIX) 20 MG tablet Take 1 tablet (20 mg total) by mouth in the morning. 07/09/19   Roxan Hockey, MD  gabapentin (NEURONTIN) 400 MG capsule Take 1 capsule (400 mg total) by mouth 2 (two) times daily. Patient taking differently: Take 400 mg by mouth at bedtime.  07/09/19   Roxan Hockey, MD  Ipratropium-Albuterol (COMBIVENT RESPIMAT) 20-100 MCG/ACT AERS respimat Inhale 1 puff into the lungs every 6 (six) hours as needed for wheezing or shortness of breath (As needed for wheezing , cough or shortness of breath). Patient taking differently: Inhale 1 puff into the lungs daily.  07/09/19   Emokpae, Courage, MD  isosorbide-hydrALAZINE (BIDIL) 20-37.5 MG tablet Take 1 tablet by mouth 2 (two) times daily. 07/09/19   Roxan Hockey, MD  MYRBETRIQ 25 MG TB24 tablet Take 25 mg by mouth in the morning and at bedtime.  09/20/16   [provider]  Nutritional Supplements (ENSURE NUTRITION SHAKE) LIQD Take 1 Bottle by mouth 4 (four) times daily. 1 bottle of chocolate flavored Ensure up to 4 times a day--give with ice-please Patient taking differently: Take 1 Bottle by mouth daily as needed (When he does not eat well).  07/09/19   Roxan Hockey, MD  pantoprazole (PROTONIX) 40 MG tablet TAKE 1 TABLET DAILY Patient taking differently: Take 40 mg by mouth in the morning.   08/13/19   Dettinger, Fransisca Kaufmann, MD  PARoxetine (PAXIL) 40 MG tablet Take 40 mg by mouth daily.    [provider]  sertraline (ZOLOFT) 50 MG tablet Take 1 tablet (50 mg total) by mouth every morning. Patient taking differently: Take 50 mg by mouth daily.  09/14/19   Dettinger, Fransisca Kaufmann, MD  simvastatin (ZOCOR) 10 MG tablet TAKE 1 TABLET ONCE DAILY IN THE EVENING Patient taking differently: Take 10 mg by mouth daily.  08/13/19   Dettinger, Fransisca Kaufmann, MD  warfarin (COUMADIN) 2.5 MG tablet Take 1 tablet (2.5 mg total) by mouth every evening. Take Tuesday & Friday with 5mg  =7.5mg  09/13/19   Dettinger, Fransisca Kaufmann, MD  warfarin (COUMADIN) 5 MG tablet Take 1 tablet (5 mg total) by mouth daily. Patient taking differently: Take 5 mg by mouth at bedtime. Take 2.5 mg on Tuesday and Friday for a total of 7.5 mg 09/13/19   Dettinger, Fransisca Kaufmann, MD    Allergies    Rocephin [ceftriaxone sodium in dextrose] and Lipitor [atorvastatin]  Review of Systems   Review of Systems  Constitutional: Negative for chills and fever.  HENT: Negative for congestion and sore throat.   Eyes: Negative.   Respiratory: Negative for chest tightness and shortness of breath.   Cardiovascular: Negative for chest pain.  Gastrointestinal: Negative for abdominal pain and nausea.  Genitourinary: Positive for difficulty urinating, hematuria and urgency.  Musculoskeletal: Negative for arthralgias, joint swelling and neck pain.  Skin: Negative.  Negative for rash and wound.  Neurological: Negative for dizziness, weakness, light-headedness, numbness and headaches.  Hematological: Does not bruise/bleed easily.  Psychiatric/Behavioral: Negative.     Physical Exam Updated Vital Signs BP (!) 143/72   Pulse 90   Temp 98.6 F (37 C)   Resp 16   SpO2 90%   Physical Exam Vitals and nursing note reviewed.  Constitutional:      Appearance: He is well-developed.  HENT:  Head: Normocephalic and atraumatic.  Cardiovascular:     Rate  and Rhythm: Normal rate and regular rhythm.     Heart sounds: Normal heart sounds.  Pulmonary:     Effort: Pulmonary effort is normal.     Breath sounds: Normal breath sounds. No wheezing.  Abdominal:     General: Bowel sounds are normal. There is no distension.     Palpations: Abdomen is soft.     Tenderness: There is no abdominal tenderness. There is no guarding.  Musculoskeletal:        General: Normal range of motion.     Cervical back: Normal range of motion.  Skin:    General: Skin is warm and dry.  Neurological:     Mental Status: He is alert.     ED Results / Procedures / Treatments   Labs (all labs ordered are listed, but only abnormal results are displayed) Labs Reviewed  URINALYSIS, ROUTINE W REFLEX MICROSCOPIC - Abnormal; Notable for the following components:      Result Value   Color, Urine AMBER (*)    APPearance CLOUDY (*)    Hgb urine dipstick LARGE (*)    Protein, ur 100 (*)    RBC / HPF >50 (*)    Bacteria, UA RARE (*)    Non Squamous Epithelial 0-5 (*)    All other components within normal limits  CBC WITH DIFFERENTIAL/PLATELET - Abnormal; Notable for the following components:   Hemoglobin 12.3 (*)    RDW 16.0 (*)    All other components within normal limits  BASIC METABOLIC PANEL - Abnormal; Notable for the following components:   BUN 29 (*)    GFR calc non Af Amer 53 (*)    All other components within normal limits  PROTIME-INR - Abnormal; Notable for the following components:   Prothrombin Time 35.1 (*)    INR 3.6 (*)    All other components within normal limits    EKG None  Radiology No results found.  Procedures Procedures (including critical care time)  Medications Ordered in ED Medications  phytonadione (VITAMIN K) tablet 2.5 mg (has no administration in time range)    ED Course  I have reviewed the triage vital signs and the nursing notes.  Pertinent labs & imaging results that were available during my care of the patient were  reviewed by me and considered in my medical decision making (see chart for details).    MDM Rules/Calculators/A&P                          Labs reviewed and discussed with patient and his son at the bedside.  He is supratherapeutic with his Coumadin with an INR 3.6.  He was able to urinate without difficulty, no Foley catheter required.  Discussed patient's case including history and today's labs with Dr. Jeffie Pollock.  He advised holding his Coumadin and continue with plan for his procedure on July 1.  However, strict return precautions if he develops urinary retention or if he continues to have blood in his urine despite holding the Coumadin.  If he continues to have bleeding, his scheduled surgery on July 1 may need to be advanced.  Strict return precautions were discussed outlined for patient and son. Final Clinical Impression(s) / ED Diagnoses Final diagnoses:  Gross hematuria  Supratherapeutic INR    Rx / DC Orders ED Discharge Orders    None       Keyasia Jolliff, Almyra Free,  PA-C 10/01/19 2217    Varney Biles, MD 10/01/19 2222

## 2019-10-01 NOTE — ED Triage Notes (Signed)
Family member states he has had blood in urine for the last 2 weeks. States he was advised to come in for treatment and evaluation by his doctor, states he is currently taking coumadin

## 2019-10-03 ENCOUNTER — Telehealth: Payer: Self-pay | Admitting: *Deleted

## 2019-10-03 DIAGNOSIS — Z86711 Personal history of pulmonary embolism: Secondary | ICD-10-CM

## 2019-10-03 DIAGNOSIS — I4891 Unspecified atrial fibrillation: Secondary | ICD-10-CM

## 2019-10-03 NOTE — Telephone Encounter (Signed)
Continue coumadin as is °

## 2019-10-03 NOTE — Telephone Encounter (Signed)
Fax received [facility] PT/INR self testing service Test date/time 09/25/19 1239pm INR 2.8

## 2019-10-04 ENCOUNTER — Other Ambulatory Visit: Payer: Self-pay | Admitting: *Deleted

## 2019-10-04 ENCOUNTER — Other Ambulatory Visit: Payer: Self-pay | Admitting: Family Medicine

## 2019-10-04 NOTE — Telephone Encounter (Signed)
Pt was in ED 10/01/19- see below notes from ED MD note  86 year old comes in a chief complaint of hematuria.  He has history of A. fib, DVT and is on warfarin.  He also has suspected bladder cancer/tumor and is supposed to get urologic procedure soon.  I suspect that the bleeding is from the tumor itself.  Patient's INR is 3.6.  We will give him oral vitamin K and advised holding Coumadin for now.  Case is also been discussed with Dr. Thurmond Butts who has recommended as long as patient is able to urinate, no need for [place] to be more aggressive beyond holding patient's Coumadin.  He is advised that we hold patient's Coumadin until July 1, when he is due for surgery.   Please advise if there are any changes or advice to take.

## 2019-10-04 NOTE — Telephone Encounter (Signed)
Pt aware of provider feedback and voiced understanding. 

## 2019-10-04 NOTE — Telephone Encounter (Signed)
I agree that holding the Coumadin is in his best interest at this time.  Since his surgery is only 1 week away, I would hold the Coumadin just for that.  Adding the hematuria into the mix, definitely he should hold off at least until after the surgery.  He should notify his urologist if the blood in the urine persists in spite of discontinuing the Coumadin.

## 2019-10-05 ENCOUNTER — Telehealth: Payer: Self-pay | Admitting: Cardiology

## 2019-10-05 NOTE — Telephone Encounter (Signed)
Dr Percival Spanish provided clearance per previous statement.  What is the question at this time? Looks like ED doctor gave instructions to patient to manage bleeding and anticoagulation

## 2019-10-05 NOTE — Telephone Encounter (Signed)
Called and spoke with son, he is calling to see if we had received the clearance for his father's surgery. Notified I would call Dr.Wrenn's office to clarify. Verbalized understanding with no other questions at this time.  Spoke with Estill Bamberg at [facility] office, she reports Dr.Hochrein has already approved the surgery and that they sent a correspondance to Dr.Hochrein on 09/07/19. Letter was also mailed from [facility] on 09/12/19 with instructions for his surgery as well as how long to hold his medication.  Pt went to ed on 10/01/19 for gross hematuria Will send to pharmD/coumadin clinic to advise

## 2019-10-05 NOTE — Telephone Encounter (Signed)
New Message:     Pt's son, [name] is calling, he wants to know if Dr Percival Spanish have communicated with Dr Jeffie Pollock or Dr Ralene Muskrat associate. Pt is scheduled for procedure on 10-11-19 and son wants to know if Dr Warren Lacy have approved the clearance for pt to have this procedure in regards to his condition?

## 2019-10-05 NOTE — Telephone Encounter (Signed)
Called and spoke with pt's son again, notfied that a letter was sent to the pt stating he had been cleared for surgery regarding anticoagulation. Also notified that in the ED note from his most recent visit it discusses that they reversed his anticoagulation. Pts son verbalized understanding with this. He reports he was told to let his provider know if he was still having bleeding after a week and currently the pt is still having some bleeding.  He reports he wants to know if he was cleared from a cardiac standpoint for this surgery. He states that his father's heart rate was really slow when he previously had anesthesia the first time and that is why he is consulting with Dr.Hochrein. pt states his father was in the hospital in march or April and they did lots of tests for heart work up. Son feels that there should be enough information with those tests hopefully to clear his father for surgery.  Notified I would send this message to Dr.Hochrein for clarification and get back with him as soon as we can.  Son verbalized understanding and had no other questions at this time.

## 2019-10-07 LAB — POCT INR: INR: 3.2 — AB (ref 2.0–3.0)

## 2019-10-08 ENCOUNTER — Telehealth: Payer: Self-pay | Admitting: *Deleted

## 2019-10-08 DIAGNOSIS — Z86711 Personal history of pulmonary embolism: Secondary | ICD-10-CM

## 2019-10-08 DIAGNOSIS — I4891 Unspecified atrial fibrillation: Secondary | ICD-10-CM

## 2019-10-08 NOTE — Telephone Encounter (Signed)
Fax received [facility] PT/INR self testing service Test date/time 10/07/19- 11:38 PM INR 3.2

## 2019-10-08 NOTE — Telephone Encounter (Signed)
Description   Hold the dose on the 29th and then continue to take 7.5 mg on Tuesday and Friday and continue to take 5 mg(2 pills) on every other day INR today is 3.2  (goal is 2-3) Recheck in 1-2 weeks     Caryl Pina, MD [place] 10/08/2019, 10:55 PM

## 2019-10-09 ENCOUNTER — Other Ambulatory Visit: Payer: Self-pay

## 2019-10-09 ENCOUNTER — Other Ambulatory Visit (HOSPITAL_COMMUNITY)
Admission: RE | Admit: 2019-10-09 | Discharge: 2019-10-09 | Disposition: A | Discharge status: Home / Self Care | Payer: Medicare Other | Source: Ambulatory Visit | Attending: Urology | Admitting: Urology

## 2019-10-09 ENCOUNTER — Encounter (HOSPITAL_COMMUNITY)
Admission: RE | Admit: 2019-10-09 | Discharge: 2019-10-09 | Disposition: A | Discharge status: Home / Self Care | Payer: Medicare Other | Source: Ambulatory Visit | Attending: Urology | Admitting: Urology

## 2019-10-09 DIAGNOSIS — Z20822 Contact with and (suspected) exposure to covid-19: Secondary | ICD-10-CM | POA: Insufficient documentation

## 2019-10-09 NOTE — Telephone Encounter (Signed)
Patient has stopped coumadin due to having surgery.

## 2019-10-10 ENCOUNTER — Other Ambulatory Visit: Payer: Self-pay | Admitting: Family Medicine

## 2019-10-10 LAB — SARS CORONAVIRUS 2 (TAT 6-24 HRS): SARS Coronavirus 2: NEGATIVE

## 2019-10-11 ENCOUNTER — Ambulatory Visit (HOSPITAL_COMMUNITY)
Admission: RE | Admit: 2019-10-11 | Discharge: 2019-10-11 | Disposition: A | Discharge status: Home / Self Care | Payer: Medicare Other | Attending: Urology | Admitting: Urology

## 2019-10-11 ENCOUNTER — Ambulatory Visit (HOSPITAL_COMMUNITY): Payer: Medicare Other | Admitting: Anesthesiology

## 2019-10-11 ENCOUNTER — Other Ambulatory Visit: Payer: Self-pay

## 2019-10-11 ENCOUNTER — Encounter (HOSPITAL_COMMUNITY)
Admission: RE | Disposition: A | Discharge status: Home / Self Care | Payer: Self-pay | Source: Home / Self Care | Attending: Urology

## 2019-10-11 ENCOUNTER — Encounter (HOSPITAL_COMMUNITY): Payer: Self-pay | Admitting: Urology

## 2019-10-11 DIAGNOSIS — I5042 Chronic combined systolic (congestive) and diastolic (congestive) heart failure: Secondary | ICD-10-CM | POA: Insufficient documentation

## 2019-10-11 DIAGNOSIS — I447 Left bundle-branch block, unspecified: Secondary | ICD-10-CM | POA: Diagnosis not present

## 2019-10-11 DIAGNOSIS — F0390 Unspecified dementia without behavioral disturbance: Secondary | ICD-10-CM | POA: Diagnosis not present

## 2019-10-11 DIAGNOSIS — Z801 Family history of malignant neoplasm of trachea, bronchus and lung: Secondary | ICD-10-CM | POA: Insufficient documentation

## 2019-10-11 DIAGNOSIS — I739 Peripheral vascular disease, unspecified: Secondary | ICD-10-CM | POA: Insufficient documentation

## 2019-10-11 DIAGNOSIS — I252 Old myocardial infarction: Secondary | ICD-10-CM | POA: Insufficient documentation

## 2019-10-11 DIAGNOSIS — D4959 Neoplasm of unspecified behavior of other genitourinary organ: Secondary | ICD-10-CM | POA: Diagnosis present

## 2019-10-11 DIAGNOSIS — I110 Hypertensive heart disease with heart failure: Secondary | ICD-10-CM | POA: Diagnosis not present

## 2019-10-11 DIAGNOSIS — R0602 Shortness of breath: Secondary | ICD-10-CM | POA: Insufficient documentation

## 2019-10-11 DIAGNOSIS — C679 Malignant neoplasm of bladder, unspecified: Secondary | ICD-10-CM | POA: Diagnosis not present

## 2019-10-11 DIAGNOSIS — C680 Malignant neoplasm of urethra: Secondary | ICD-10-CM | POA: Insufficient documentation

## 2019-10-11 DIAGNOSIS — H409 Unspecified glaucoma: Secondary | ICD-10-CM | POA: Diagnosis not present

## 2019-10-11 DIAGNOSIS — Z86711 Personal history of pulmonary embolism: Secondary | ICD-10-CM | POA: Diagnosis not present

## 2019-10-11 DIAGNOSIS — I4891 Unspecified atrial fibrillation: Secondary | ICD-10-CM | POA: Insufficient documentation

## 2019-10-11 DIAGNOSIS — I428 Other cardiomyopathies: Secondary | ICD-10-CM | POA: Diagnosis not present

## 2019-10-11 DIAGNOSIS — R310 Gross hematuria: Secondary | ICD-10-CM | POA: Diagnosis not present

## 2019-10-11 DIAGNOSIS — K219 Gastro-esophageal reflux disease without esophagitis: Secondary | ICD-10-CM | POA: Insufficient documentation

## 2019-10-11 DIAGNOSIS — Z881 Allergy status to other antibiotic agents status: Secondary | ICD-10-CM | POA: Insufficient documentation

## 2019-10-11 DIAGNOSIS — J449 Chronic obstructive pulmonary disease, unspecified: Secondary | ICD-10-CM | POA: Insufficient documentation

## 2019-10-11 DIAGNOSIS — M1990 Unspecified osteoarthritis, unspecified site: Secondary | ICD-10-CM | POA: Insufficient documentation

## 2019-10-11 DIAGNOSIS — Z87891 Personal history of nicotine dependence: Secondary | ICD-10-CM | POA: Insufficient documentation

## 2019-10-11 DIAGNOSIS — Z86718 Personal history of other venous thrombosis and embolism: Secondary | ICD-10-CM | POA: Insufficient documentation

## 2019-10-11 DIAGNOSIS — Z89612 Acquired absence of left leg above knee: Secondary | ICD-10-CM | POA: Insufficient documentation

## 2019-10-11 DIAGNOSIS — F329 Major depressive disorder, single episode, unspecified: Secondary | ICD-10-CM | POA: Insufficient documentation

## 2019-10-11 DIAGNOSIS — M1909 Primary osteoarthritis, other specified site: Secondary | ICD-10-CM | POA: Insufficient documentation

## 2019-10-11 DIAGNOSIS — Z8551 Personal history of malignant neoplasm of bladder: Secondary | ICD-10-CM | POA: Diagnosis not present

## 2019-10-11 DIAGNOSIS — Z8249 Family history of ischemic heart disease and other diseases of the circulatory system: Secondary | ICD-10-CM | POA: Insufficient documentation

## 2019-10-11 DIAGNOSIS — D494 Neoplasm of unspecified behavior of bladder: Secondary | ICD-10-CM | POA: Diagnosis not present

## 2019-10-11 DIAGNOSIS — I2510 Atherosclerotic heart disease of native coronary artery without angina pectoris: Secondary | ICD-10-CM | POA: Insufficient documentation

## 2019-10-11 DIAGNOSIS — E785 Hyperlipidemia, unspecified: Secondary | ICD-10-CM | POA: Insufficient documentation

## 2019-10-11 DIAGNOSIS — D0919 Carcinoma in situ of other urinary organs: Secondary | ICD-10-CM | POA: Diagnosis not present

## 2019-10-11 DIAGNOSIS — Z8261 Family history of arthritis: Secondary | ICD-10-CM | POA: Insufficient documentation

## 2019-10-11 HISTORY — PX: CYSTOSCOPY: SHX5120

## 2019-10-11 HISTORY — PX: TRANSURETHRAL RESECTION OF BLADDER TUMOR: SHX2575

## 2019-10-11 LAB — PROTIME-INR
INR: 1.6 — ABNORMAL HIGH (ref 0.8–1.2)
Prothrombin Time: 18.4 seconds — ABNORMAL HIGH (ref 11.4–15.2)

## 2019-10-11 SURGERY — CYSTOSCOPY
Anesthesia: General

## 2019-10-11 MED ORDER — CIPROFLOXACIN IN D5W 400 MG/200ML IV SOLN
400.0000 mg | INTRAVENOUS | Status: AC
  Administered 2019-10-11: 400 mg via INTRAVENOUS

## 2019-10-11 MED ORDER — FENTANYL CITRATE (PF) 100 MCG/2ML IJ SOLN
25.0000 ug | INTRAMUSCULAR | Status: DC | PRN
  Administered 2019-10-11: 50 ug via INTRAVENOUS
  Filled 2019-10-11: qty 2

## 2019-10-11 MED ORDER — CHLORHEXIDINE GLUCONATE 0.12 % MT SOLN
OROMUCOSAL | Status: AC
  Filled 2019-10-11: qty 15

## 2019-10-11 MED ORDER — CHLORHEXIDINE GLUCONATE 0.12 % MT SOLN
15.0000 mL | Freq: Once | OROMUCOSAL | Status: AC
  Administered 2019-10-11: 15 mL via OROMUCOSAL

## 2019-10-11 MED ORDER — PROPOFOL 10 MG/ML IV BOLUS
INTRAVENOUS | Status: AC
  Filled 2019-10-11: qty 20

## 2019-10-11 MED ORDER — LIDOCAINE 2% (20 MG/ML) 5 ML SYRINGE
INTRAMUSCULAR | Status: AC
  Filled 2019-10-11: qty 5

## 2019-10-11 MED ORDER — PROPOFOL 10 MG/ML IV BOLUS
INTRAVENOUS | Status: DC | PRN
  Administered 2019-10-11: 50 ug/kg/min via INTRAVENOUS

## 2019-10-11 MED ORDER — LIDOCAINE HCL URETHRAL/MUCOSAL 2 % EX GEL
CUTANEOUS | Status: AC
  Filled 2019-10-11: qty 10

## 2019-10-11 MED ORDER — FENTANYL CITRATE (PF) 100 MCG/2ML IJ SOLN
INTRAMUSCULAR | Status: AC
  Filled 2019-10-11: qty 2

## 2019-10-11 MED ORDER — CIPROFLOXACIN IN D5W 400 MG/200ML IV SOLN
INTRAVENOUS | Status: AC
  Filled 2019-10-11: qty 200

## 2019-10-11 MED ORDER — LACTATED RINGERS IV SOLN: INTRAVENOUS | Status: DC

## 2019-10-11 MED ORDER — FENTANYL CITRATE (PF) 100 MCG/2ML IJ SOLN
INTRAMUSCULAR | Status: DC | PRN
  Administered 2019-10-11: 25 ug via INTRAVENOUS

## 2019-10-11 MED ORDER — ORAL CARE MOUTH RINSE: 15.0000 mL | Freq: Once | OROMUCOSAL | Status: AC

## 2019-10-11 MED ORDER — SODIUM CHLORIDE 0.9 % IR SOLN
Status: DC | PRN
  Administered 2019-10-11: 3000 mL via INTRAVESICAL

## 2019-10-11 MED ORDER — LIDOCAINE HCL (CARDIAC) PF 100 MG/5ML IV SOSY
PREFILLED_SYRINGE | INTRAVENOUS | Status: DC | PRN
  Administered 2019-10-11: 40 mg via INTRAVENOUS

## 2019-10-11 SURGICAL SUPPLY — 24 items
BAG DRAIN URO TABLE W/ADPT NS (BAG) ×2 IMPLANT
BAG HAMPER (MISCELLANEOUS) ×2 IMPLANT
BAG URINE DRAIN 2000ML AR STRL (UROLOGICAL SUPPLIES) ×2 IMPLANT
CATH FOLEY 3WAY 30CC 22F (CATHETERS) ×2 IMPLANT
CLOTH BEACON ORANGE TIMEOUT ST (SAFETY) ×2 IMPLANT
ELECT LOOP 22F BIPOLAR SML (ELECTROSURGICAL) ×2
ELECT REM PT RETURN 9FT ADLT (ELECTROSURGICAL) ×2
ELECTRODE LOOP 22F BIPOLAR SML (ELECTROSURGICAL) ×1 IMPLANT
ELECTRODE REM PT RTRN 9FT ADLT (ELECTROSURGICAL) ×1 IMPLANT
GLOVE BIO SURGEON STRL SZ8 (GLOVE) ×2 IMPLANT
GLOVE BIOGEL PI IND STRL 7.0 (GLOVE) ×2 IMPLANT
GLOVE BIOGEL PI INDICATOR 7.0 (GLOVE) ×2
GOWN STRL REUS W/TWL LRG LVL3 (GOWN DISPOSABLE) ×2 IMPLANT
GOWN STRL REUS W/TWL XL LVL3 (GOWN DISPOSABLE) ×2 IMPLANT
IV NS IRRIG 3000ML ARTHROMATIC (IV SOLUTION) ×2 IMPLANT
KIT TURNOVER CYSTO (KITS) ×2 IMPLANT
PACK CYSTO (CUSTOM PROCEDURE TRAY) ×2 IMPLANT
PAD ARMBOARD 7.5X6 YLW CONV (MISCELLANEOUS) ×2 IMPLANT
PLUG CATH AND CAP STER (CATHETERS) ×2 IMPLANT
SYR 30ML LL (SYRINGE) ×2 IMPLANT
SYR TOOMEY 50ML (SYRINGE) ×2 IMPLANT
SYR TOOMEY IRRIG 70ML (MISCELLANEOUS) ×2
SYRINGE TOOMEY IRRIG 70ML (MISCELLANEOUS) ×1 IMPLANT
TOWEL OR 17X26 4PK STRL BLUE (TOWEL DISPOSABLE) ×2 IMPLANT

## 2019-10-11 NOTE — Anesthesia Postprocedure Evaluation (Signed)
Anesthesia Post Note  Patient: Scott [name]  Procedure(s) Performed: CYSTOSCOPY (N/A ) TRANSURETHRAL RESECTION OF PROSTATIC URETHRAL TUMOR (N/A )  Patient location during evaluation: PACU Anesthesia Type: General Level of consciousness: awake and patient cooperative Pain management: satisfactory to patient Vital Signs Assessment: post-procedure vital signs reviewed and stable Respiratory status: spontaneous breathing Cardiovascular status: stable Postop Assessment: no apparent nausea or vomiting Anesthetic complications: no   No complications documented.   Last Vitals:  Vitals:   10/11/19 1530 10/11/19 1545  BP: (!) 87/59 (!) 152/78  Pulse: (!) 51 (!) 57  Resp: (!) 22 14  Temp:    SpO2: 93% 100%    Last Pain:  Vitals:   10/11/19 1545  TempSrc:   PainSc: Asleep                 Hewitt Garner

## 2019-10-11 NOTE — H&P (Signed)
Urology Admission H&P  Chief Complaint: bladder cancer  History of Present Illness: [name] is a 86yo with a history of bladder cancer who was found to have a tumor recurrance in the prostatic urethra. He is having intermittent dysuria. No hematuria  Past Medical History:  Diagnosis Date  . Arthritis    "all over"  . Atrial fibrillation ([place])   . Benign localized prostatic hyperplasia with lower urinary tract symptoms (LUTS)   . Bradycardia 11/28/2015   Severe-HR in the 20s to 30s following intubation for urological procedure.  Marland Kitchen CAD- non obstructive disease by cath 3/14 cardiologist-  dr hochrein  . Chronic lower back pain   . COPD (chronic obstructive pulmonary disease) ([place])   . DDD (degenerative disc disease)   . Depression   . Dyspnea    w/ exertion and lying down (raise head)  . ETOH abuse   . GERD (gastroesophageal reflux disease)   . Glaucoma   . Glaucoma, both eyes   . History of bladder cancer urologist-- dr Jeffie Pollock   first dx 2012--  s/p TURBT's  . History of DVT of lower extremity 03/2013   bilateral   . History of pulmonary embolus (PE) 04/2013  . HTN (hypertension)   . Hyperlipidemia   . Hypertension   . LBBB (left bundle branch block)    chronic  . Non-ischemic cardiomyopathy ([place])    last echo 01-18-2017, ef 35-40%  . NSVT (nonsustained ventricular tachycardia) ([place])    a. NSVT 06/2012; NSVT also seen during 03/2013 adm. b. Med rx. Not candidate for ICD given adv age.  Marland Kitchen PAD (peripheral artery disease) ([place])   . 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 PVCs (premature ventricular contractions)   . PVD (peripheral vascular disease) ([facility])    Right ABI .75, Left .78 (2006)  . Syncope 06/2012   a. Felt to be postural syncope related to diuretics 06/2012.  Marland Kitchen Systolic and diastolic CHF, chronic (HCC) cardiologsit-  dr hochrein   a. NICM - patent cors 06/2012, EF 40% at  that time. b. 03/2013 eval: EF 20-25%.  . Vision loss, left eye    due to glaucoma  . Wears glasses    Past Surgical History:  Procedure Laterality Date  . AMPUTATION Left 10/16/2014   Procedure: AMPUTATION ABOVE KNEE- LEFT;  Surgeon: Mal Misty, MD;  Location: [facility];  Service: Vascular;  Laterality: Left;  . CARDIAC CATHETERIZATION  05-11-2004  DR [facility]   MINIMAL CORANARY PLAQUE/ NORMAL LVF/ EF 55%/  NON-OBSTRUCTIVE LAD 25%  . CARDIAC CATHETERIZATION  06/18/2012   dr hochrein   mild luminal irregularities of coronaries, ef 45-50%  . CARDIOVASCULAR STRESS TEST  10-15-2010   LOW RISK NUCLEAR STUDY/ NO EVIDENCE OF ISCHEMIA/ NORMAL EF  . CATARACT EXTRACTION W/ INTRAOCULAR LENS  IMPLANT, BILATERAL Bilateral   . CYSTOSCOPY  10/07/2011   Procedure: CYSTOSCOPY;  Surgeon: Malka So, MD;  Location: [facility];  Service: Urology;  Laterality: N/A;  . CYSTOSCOPY N/A 10/20/2018   Procedure: CYSTOSCOPY WITH FULGURATION OF PROSTATIC URETHRAL TUMOR;  Surgeon: [name], MD;  Location: [facility];  Service: Urology;  Laterality: N/A;  . CYSTOSCOPY WITH BIOPSY  03/14/2012   Procedure: CYSTOSCOPY WITH BIOPSY;  Surgeon: Malka So, MD;  Location: [facility];  Service: Urology;  Laterality: N/A;  WITH FULGURATION  . CYSTOSCOPY WITH BIOPSY N/A 12/09/2016   Procedure: CYSTOSCOPY WITH  BIOPSY AND FULGURATION;  Surgeon: [name], MD;  Location: [facility];  Service: Urology;  Laterality: N/A;  . CYSTOSCOPY WITH BIOPSY N/A 12/20/2017   Procedure: CYSTOSCOPY WITH BIOPSY WITH FULGURATION;  Surgeon: [name], MD;  Location: [facility];  Service: Urology;  Laterality: N/A;  . CYSTOSCOPY WITH BIOPSY N/A 06/09/2018   Procedure: CYSTOSCOPY WITH BIOPSY AND FULGURATION;  Surgeon: [name], MD;  Location: [facility];  Service: Urology;  Laterality: N/A;  . CYSTOSCOPY WITH FULGERATION N/A 01/20/2016   Procedure: CYSTOSCOPY, BIOPSY WITH FULGERATION OF URTHRAL TUMOR;  Surgeon: [name], MD;  Location[facility];   Service: Urology;  Laterality: N/A;  . CYSTOSCOPY WITH FULGERATION N/A 06/01/2016   Procedure: CYSTOSCOPY WITH FULGERATION urethral tumors and bladder neck;  Surgeon: [name], MD;  Location[facility];  Service: Urology;  Laterality: N/A;  . SHOULDER SURGERY Left 1970's  . TONSILLECTOMY    . TRANSTHORACIC ECHOCARDIOGRAM  01-18-2017   dr hochrein   moderate LVH, ef 35-40%, diffuse hypokinesis/  trivial AR and MR/  mild TR  . TRANSURETHRAL RESECTION OF BLADDER TUMOR  10/07/2011   Procedure: TRANSURETHRAL RESECTION OF BLADDER TUMOR (TURBT);  Surgeon: Malka So, MD;  Location: [facility];  Service: Urology;  Laterality: N/A;  . TRANSURETHRAL RESECTION OF BLADDER TUMOR N/A 07/10/2015   Procedure: TRANSURETHRAL RESECTION OF BLADDER TUMOR (TURBT), CYSTOSCOPY ;  Surgeon: [name], MD;  Location[facility];  Service: Urology;  Laterality: N/A;  . TRANSURETHRAL RESECTION OF PROSTATE  03/14/2012   Procedure: TRANSURETHRAL RESECTION OF THE PROSTATE WITH GYRUS INSTRUMENTS;  Surgeon: Malka So, MD;  Location[facility];  Service: Urology;  Laterality: N/A;    Home Medications:  Current Facility-Administered Medications  Medication Dose Route Frequency Provider Last Rate Last Admin  . ciprofloxacin (CIPRO) IVPB 400 mg  400 mg Intravenous 60 min Pre-Op [name], MD       Allergies:  Allergies  Allergen Reactions  . Rocephin [Ceftriaxone Sodium In Dextrose] Other (See Comments)    Recurrent seizures  . Lipitor [Atorvastatin] Other (See Comments)    Unknown rxn per pt    Family History  Problem Relation Age of Onset  . Hypertension Mother   . Arthritis Mother   . Lung cancer Sister   . Deep vein thrombosis Father   . Early death Brother    Social History:  reports that he quit smoking about 44 years ago. His smoking use included cigarettes. He started smoking about 77 years ago. He has a 35.00 pack-year smoking history. He has never used smokeless tobacco. He reports previous  alcohol use of about 7.0 standard drinks of alcohol per week. He reports that he does not use drugs.  Review of Systems  All other systems reviewed and are negative.   Physical Exam:  Vital signs in last 24 hours: Temp:  [98.1 F (36.7 C)] 98.1 F (36.7 C) (07/01 1148) Pulse Rate:  [72] 72 (07/01 1148) Resp:  [18] 18 (07/01 1148) BP: (145)/(82) 145/82 (07/01 1148) SpO2:  [93 %] 93 % (07/01 1148) Weight:  [68 kg] 68 kg (07/01 1148) Physical Exam Constitutional:      Appearance: Normal appearance.  HENT:     Head: Normocephalic and atraumatic.  Eyes:     Extraocular Movements: Extraocular movements intact.     Pupils: Pupils are equal, round, and reactive to light.  Pulmonary:     Effort: Pulmonary effort is normal. No respiratory distress.  Abdominal:  General: Abdomen is flat. There is no distension.  Musculoskeletal:     Cervical back: Normal range of motion and neck supple.  Skin:    General: Skin is warm and dry.  Neurological:     General: No focal deficit present.     Mental Status: He is alert and oriented to person, place, and time.  Psychiatric:        Mood and Affect: Mood normal.        Behavior: Behavior normal.        Thought Content: Thought content normal.        Judgment: Judgment normal.     Laboratory Data:  No results found. However, due to the size of the patient record, not all encounters were searched. Please check Results Review for a complete set of results. Recent Results (from the past 240 hour(s))  SARS CORONAVIRUS 2 (TAT 6-24 HRS) Nasopharyngeal Nasopharyngeal Swab     Status: None   Collection Time: 10/09/19 10:11 AM   Specimen: Nasopharyngeal Swab  Result Value Ref Range Status   SARS Coronavirus 2 NEGATIVE NEGATIVE Final    Comment: (NOTE) SARS-CoV-2 target nucleic acids are NOT DETECTED.  The SARS-CoV-2 RNA is generally detectable in upper and lower respiratory specimens during the acute phase of infection. Negative results do  not preclude SARS-CoV-2 infection, do not rule out co-infections with other pathogens, and should not be used as the sole basis for treatment or other patient management decisions. Negative results must be combined with clinical observations, patient history, and epidemiological information. The expected result is Negative.  Fact Sheet for Patients: SugarRoll.be  Fact Sheet for Healthcare Providers: https://www.woods-mathews.com/  This test is not yet approved or cleared by the [place] FDA and  has been authorized for detection and/or diagnosis of SARS-CoV-2 by FDA under an Emergency Use Authorization (EUA). This EUA will remain  in effect (meaning this test can be used) for the duration of the COVID-19 declaration under Se ction 564(b)(1) of the Act, 21 U.S.C. section 360bbb-3(b)(1), unless the authorization is terminated or revoked sooner.  Performed at [facility], [place] [address] [place], [place] [address]    Creatinine: No results for input(s): CREATININE in the last 168 hours. Baseline Creatinine: unknown  Impression/Assessment:  86yo with bladder cancer  Plan:  The risks/benefits/alternatives to TURBT was explained to the patient and he understands and wishes to proceed with surgery  Nicolette Bang 10/11/2019, 1:09 PM

## 2019-10-11 NOTE — Discharge Instructions (Signed)
Indwelling Urinary Catheter Care, Adult An indwelling urinary catheter is a thin tube that is put into your bladder. The tube helps to drain pee (urine) out of your body. The tube goes in through your urethra. Your urethra is where pee comes out of your body. Your pee will come out through the catheter, then it will go into a bag (drainage bag). Take good care of your catheter so it will work well. How to wear your catheter and bag Supplies needed  Sticky tape (adhesive tape) or a leg strap.  Alcohol wipe or soap and water (if you use tape).  A clean towel (if you use tape).  Large overnight bag.  Smaller bag (leg bag). Wearing your catheter Attach your catheter to your leg with tape or a leg strap.  Make sure the catheter is not pulled tight.  If a leg strap gets wet, take it off and put on a dry strap.  If you use tape to hold the bag on your leg: 1. Use an alcohol wipe or soap and water to wash your skin where the tape made it sticky before. 2. Use a clean towel to pat-dry that skin. 3. Use new tape to make the bag stay on your leg. Wearing your bags You should have been given a large overnight bag.  You may wear the overnight bag in the day or night.  Always have the overnight bag lower than your bladder.  Do not let the bag touch the floor.  Before you go to sleep, put a clean plastic bag in a wastebasket. Then hang the overnight bag inside the wastebasket. You should also have a smaller leg bag that fits under your clothes.  Always wear the leg bag below your knee.  Do not wear your leg bag at night. How to care for your skin and catheter Supplies needed  A clean washcloth.  Water and mild soap.  A clean towel. Caring for your skin and catheter      Clean the skin around your catheter every day: 1. Wash your hands with soap and water. 2. Wet a clean washcloth in warm water and mild soap. 3. Clean the skin around your urethra.  If you are  male:  Gently spread the folds of skin around your vagina (labia).  With the washcloth in your other hand, wipe the inner side of your labia on each side. Wipe from front to back.  If you are male:  Pull back any skin that covers the end of your penis (foreskin).  With the washcloth in your other hand, wipe your penis in small circles. Start wiping at the tip of your penis, then move away from the catheter.  Move the foreskin back in place, if needed. 4. With your free hand, hold the catheter close to where it goes into your body.  Keep holding the catheter during cleaning so it does not get pulled out. 5. With the washcloth in your other hand, clean the catheter.  Only wipe downward on the catheter.  Do not wipe upward toward your body. Doing this may push germs into your urethra and cause infection. 6. Use a clean towel to pat-dry the catheter and the skin around it. Make sure to wipe off all soap. 7. Wash your hands with soap and water.  Shower every day. Do not take baths.  Do not use cream, ointment, or lotion on the area where the catheter goes into your body, unless your doctor tells you   to.  Do not use powders, sprays, or lotions on your genital area.  Check your skin around the catheter every day for signs of infection. Check for: ? Redness, swelling, or pain. ? Fluid or blood. ? Warmth. ? Pus or a bad smell. How to empty the bag Supplies needed  Rubbing alcohol.  Gauze pad or cotton ball.  Tape or a leg strap. Emptying the bag Pour the pee out of your bag when it is ?- full, or at least 2-3 times a day. Do this for your overnight bag and your leg bag. 1. Wash your hands with soap and water. 2. Separate (detach) the bag from your leg. 3. Hold the bag over the toilet or a clean pail. Keep the bag lower than your hips and bladder. This is so the pee (urine) does not go back into the tube. 4. Open the pour spout. It is at the bottom of the bag. 5. Empty the  pee into the toilet or pail. Do not let the pour spout touch any surface. 6. Put rubbing alcohol on a gauze pad or cotton ball. 7. Use the gauze pad or cotton ball to clean the pour spout. 8. Close the pour spout. 9. Attach the bag to your leg with tape or a leg strap. 10. Wash your hands with soap and water. Follow instructions for cleaning the drainage bag:  From the product maker.  As told by your doctor. How to change the bag Supplies needed  Alcohol wipes.  A clean bag.  Tape or a leg strap. Changing the bag Replace your bag when it starts to leak, smell bad, or look dirty. 1. Wash your hands with soap and water. 2. Separate the dirty bag from your leg. 3. Pinch the catheter with your fingers so that pee does not spill out. 4. Separate the catheter tube from the bag tube where these tubes connect (at the connection valve). Do not let the tubes touch any surface. 5. Clean the end of the catheter tube with an alcohol wipe. Use a different alcohol wipe to clean the end of the bag tube. 6. Connect the catheter tube to the tube of the clean bag. 7. Attach the clean bag to your leg with tape or a leg strap. Do not make the bag tight on your leg. 8. Wash your hands with soap and water. General rules   Never pull on your catheter. Never try to take it out. Doing that can hurt you.  Always wash your hands before and after you touch your catheter or bag. Use a mild, fragrance-free soap. If you do not have soap and water, use hand sanitizer.  Always make sure there are no twists or bends (kinks) in the catheter tube.  Always make sure there are no leaks in the catheter or bag.  Drink enough fluid to keep your pee pale yellow.  Do not take baths, swim, or use a hot tub.  If you are male, wipe from front to back after you poop (have a bowel movement). Contact a doctor if:  Your pee is cloudy.  Your pee smells worse than usual.  Your catheter gets clogged.  Your catheter  leaks.  Your bladder feels full. Get help right away if:  You have redness, swelling, or pain where the catheter goes into your body.  You have fluid, blood, pus, or a bad smell coming from the area where the catheter goes into your body.  Your skin feels warm where   the catheter goes into your body.  You have a fever.  You have pain in your: ? Belly (abdomen). ? Legs. ? Lower back. ? Bladder.  You see blood in the catheter.  Your pee is pink or red.  You feel sick to your stomach (nauseous).  You throw up (vomit).  You have chills.  Your pee is not draining into the bag.  Your catheter gets pulled out. Summary  An indwelling urinary catheter is a thin tube that is placed into the bladder to help drain pee (urine) out of the body.  The catheter is placed into the part of the body that drains pee from the bladder (urethra).  Taking good care of your catheter will keep it working properly and help prevent problems.  Always wash your hands before and after touching your catheter or bag.  Never pull on your catheter or try to take it out. This information is not intended to replace advice given to you by your health care provider. Make sure you discuss any questions you have with your health care provider. Document Revised: 07/21/2018 Document Reviewed: 11/12/2016 Elsevier Patient Education  2020 Elsevier Inc.  

## 2019-10-11 NOTE — Anesthesia Preprocedure Evaluation (Signed)
Anesthesia Evaluation  Patient identified by MRN, date of birth, ID band Patient awake    Reviewed: Allergy & Precautions, NPO status , Patient's Chart, lab work & pertinent test results  Airway Mallampati: II  TM Distance: >3 FB Neck ROM: Full    Dental no notable dental hx. (+) Poor Dentition, Missing   Pulmonary shortness of breath and with exertion, pneumonia, resolved, COPD,  COPD inhaler, former smoker,    Pulmonary exam normal breath sounds clear to auscultation       Cardiovascular Exercise Tolerance: Poor hypertension, Pt. on medications + CAD, + Past MI, + Peripheral Vascular Disease and +CHF  Normal cardiovascular exam+ dysrhythmias II Rhythm:Regular Rate:Normal  Non ambulatory  H/o Nonsustained Vtach -not an AICD candidate  Non ischemic cardiomyopathy last EF 2018 ~35%   Neuro/Psych PSYCHIATRIC DISORDERS Depression Dementia negative neurological ROS     GI/Hepatic Neg liver ROS, GERD  Medicated and Controlled,  Endo/Other  negative endocrine ROS  Renal/GU Renal InsufficiencyRenal diseaseHere for fulgeration   negative genitourinary   Musculoskeletal negative musculoskeletal ROS (+)   Abdominal   Peds negative pediatric ROS (+)  Hematology negative hematology ROS (+)   Anesthesia Other Findings   Reproductive/Obstetrics negative OB ROS                             Anesthesia Physical  Anesthesia Plan  ASA: IV  Anesthesia Plan: General   Post-op Pain Management:    Induction: Intravenous  PONV Risk Score and Plan: 1 and Treatment may vary due to age or medical condition, Propofol infusion and TIVA  Airway Management Planned: Nasal Cannula and Simple Face Mask  Additional Equipment:   Intra-op Plan:   Post-operative Plan:   Informed Consent: I have reviewed the patients History and Physical, chart, labs and discussed the procedure including the risks, benefits and  alternatives for the proposed anesthesia with the patient or authorized representative who has indicated his/her understanding and acceptance.   Patient has DNR.  Discussed DNR with patient, Discussed DNR with power of attorney and Suspend DNR.   Dental advisory given  Plan Discussed with: CRNA  Anesthesia Plan Comments: (Plan full PPE use  Plan MAC as tolerated , GA vs GETA as needed  Plan to suspend DNR in periop period )        Anesthesia Quick Evaluation

## 2019-10-11 NOTE — Transfer of Care (Signed)
Immediate Anesthesia Transfer of Care Note  Patient: Scott [name]  Procedure(s) Performed: CYSTOSCOPY (N/A ) TRANSURETHRAL RESECTION OF BLADDER TUMOR (TURBT) (N/A )  Patient Location: PACU  Anesthesia Type:MAC  Level of Consciousness: drowsy and patient cooperative  Airway & Oxygen Therapy: Patient Spontanous Breathing and Patient connected to face mask oxygen  Post-op Assessment: Report given to RN and Post -op Vital signs reviewed and stable  Post vital signs: Reviewed and stable  Last Vitals:  Vitals Value Taken Time  BP    Temp    Pulse 67 10/11/19 1506  Resp 18 10/11/19 1506  SpO2 100 % 10/11/19 1506  Vitals shown include unvalidated device data.  Last Pain:  Vitals:   10/11/19 1148  TempSrc: Oral  PainSc: 0-No pain      Patients Stated Pain Goal: 8 (62/70/35 0093)  Complications: No complications documented.

## 2019-10-11 NOTE — Op Note (Signed)
.  Preoperative diagnosis: prostatic urethra tumor  Postoperative diagnosis: Same  Procedure: 1 cystoscopy 2.. Transurethral resection of bladder tumor, medium  Attending: Rosie Fate  Anesthesia: MAC  Estimated blood loss: Minimal  Drains: 22 French foley  Specimens: prostatic urethra papillary tumor  Antibiotics: cipro  Findings: 3cm papillary carpeting prostatic urethra.  Ureteral orifices in normal anatomic location.   Indications: Patient is a 86 year old male with a history of bladder tumor and gross hematuria.  After discussing treatment options, they decided proceed with transurethral resection of a bladder tumor.  Procedure her in detail: The patient was brought to the operating room and a brief timeout was done to ensure correct patient, correct procedure, correct site.  General anesthesia was administered patient was placed in dorsal lithotomy position.  Their genitalia was then prepped and draped in usual sterile fashion.  A rigid 11 French cystoscope was passed in the urethra and the bladder.  Bladder was inspected and we noted a 3cm tumor in the prostatic urethra.  the ureteral orifices were in the normal orthotopic locations.  Using the bipolar resectoscope we removed the bladder tumor down to the base. Hemostasis was then obtained with electrocautery. We then removed the bladder tumor chips and sent them for pathology. We then re-inspected the bladder and found no residula bleeding.  the bladder was then drained, a 22 French foley was placed and this concluded the procedure which was well tolerated by patient.  Complications: None  Condition: Stable, extubated, transferred to PACU  Plan: Patient is to be discharged home and followup in 5 days for foley catheter removal and pathology discussion.

## 2019-10-12 ENCOUNTER — Encounter (HOSPITAL_COMMUNITY): Payer: Self-pay | Admitting: Urology

## 2019-10-12 ENCOUNTER — Telehealth: Payer: Self-pay | Admitting: Urology

## 2019-10-12 NOTE — Telephone Encounter (Signed)
I spoke with Scott [name], the son, with concerns of pt going home post op with catheter. Son is worried with pt dementia he will pull the catheter out before Dr. Alyson Ingles 5 day recommendation. I told the son I would speak with Dr. Jeffie Pollock about having the caretaker remove the catheter.

## 2019-10-12 NOTE — Addendum Note (Signed)
Addendum  created 10/12/19 1000 by Vista Deck, CRNA   Charge Capture section accepted

## 2019-10-12 NOTE — Telephone Encounter (Signed)
I didn't do the procedure, but I would rarely leave a foley that long after a TURP or TURBT and if he is not having bleeding, I think it would be ok for the foley to be removed so long as he is aware of the need to go to the ER over the long weekend if he is unable to void.

## 2019-10-12 NOTE — Telephone Encounter (Signed)
Called caretaker. No answer. Mailbox full.

## 2019-10-12 NOTE — Telephone Encounter (Signed)
Son called and made aware of Dr. Jeffie Pollock response.

## 2019-10-12 NOTE — Telephone Encounter (Signed)
Patients caretaker called and state that he is having issues from procedure yesterday and needs a call back.

## 2019-10-15 LAB — POCT INR: INR: 1.3 — AB (ref 2.0–3.0)

## 2019-10-16 ENCOUNTER — Telehealth: Payer: Self-pay | Admitting: *Deleted

## 2019-10-16 DIAGNOSIS — I4891 Unspecified atrial fibrillation: Secondary | ICD-10-CM

## 2019-10-16 DIAGNOSIS — Z86711 Personal history of pulmonary embolism: Secondary | ICD-10-CM

## 2019-10-16 LAB — SURGICAL PATHOLOGY

## 2019-10-16 NOTE — Telephone Encounter (Signed)
Fax received mdINR PT/INR self testing service Test date/time 10/16/19 1224 pm INR 1.3

## 2019-10-16 NOTE — Telephone Encounter (Signed)
Spoke with pt's son and he says the pt started taking his coumadin on Friday-the day after his surgery

## 2019-10-16 NOTE — Telephone Encounter (Signed)
Have him take 5 mg a day for three days. Then resume the usual schedule

## 2019-10-16 NOTE — Telephone Encounter (Signed)
   Description    Please verify that he is taking his coumadin. I asked for him to hold it last week fir one day. The result today looks like he has been holding it ever since.

## 2019-10-16 NOTE — Telephone Encounter (Signed)
House sitter made aware

## 2019-10-18 ENCOUNTER — Other Ambulatory Visit: Payer: Self-pay | Admitting: *Deleted

## 2019-10-18 DIAGNOSIS — I5042 Chronic combined systolic (congestive) and diastolic (congestive) heart failure: Secondary | ICD-10-CM

## 2019-10-18 MED ORDER — FUROSEMIDE 20 MG PO TABS: 20.0000 mg | ORAL_TABLET | Freq: Every morning | ORAL | 0 refills | Status: DC

## 2019-10-23 ENCOUNTER — Telehealth: Payer: Self-pay | Admitting: *Deleted

## 2019-10-23 DIAGNOSIS — I4891 Unspecified atrial fibrillation: Secondary | ICD-10-CM

## 2019-10-23 DIAGNOSIS — Z86711 Personal history of pulmonary embolism: Secondary | ICD-10-CM

## 2019-10-23 NOTE — Telephone Encounter (Signed)
Verified dosage with caregiver and advised to continue current dosage.

## 2019-10-23 NOTE — Telephone Encounter (Signed)
Can we verify his current warfarin dosing? The computer has him taking 5 mg daily except Tuesday and Friday 7.5 mg.   If this is correct continue current dosing as INR is 2.3 (at goal).

## 2019-10-23 NOTE — Telephone Encounter (Signed)
Fax received mdINR PT/INR self testing service Test date/time 10/22/19 948 pm INR 2.3

## 2019-10-30 ENCOUNTER — Telehealth: Payer: Self-pay | Admitting: Urology

## 2019-10-30 NOTE — Telephone Encounter (Signed)
Called son, at interview, will call me back

## 2019-10-30 NOTE — Telephone Encounter (Signed)
Pts caretaker called and asked for a nurse to call him. Pts condition is worse following procedure per caretaker.

## 2019-11-01 ENCOUNTER — Other Ambulatory Visit: Payer: Self-pay | Admitting: Family Medicine

## 2019-11-01 NOTE — Progress Notes (Signed)
Subjective:  1. 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 Gross hematuria   3. Personal history of urinary infection     GU hx: Scott [name] returns today in f/u from recent TURBT of recurrent prostatic urethral LG NMIBC on 10/11/19.  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  His urine cleared post op but since Scott [name] resumed Warfarin it has gotten bloody again and there are clots.  His INR was 3.5 on Monday and the Warfarin was held on Tuesday.  The bleeding has improved some since then.   Scott [name] has some dysuria but no difficulty voiding.  Scott [name] has urgency and can have UUI.   Scott [name] remains on Myrbetriq.  Scott [name] has a history of UTI's but is not on treatment now.    Scott [name] has no pain.      IPSS    Row Name 11/02/19 1000         International Prostate Symptom Score   How often have you had the sensation of not emptying your bladder? Not at All     How often have you had to urinate less than every two hours? Not at All     How often have you found you stopped and started again several times when you urinated? Not at All     How often have you found it difficult to postpone urination? Not at All     How often have you had a weak urinary stream? Not at All     How often have you had to strain to start urination? Not at All     How many times did you typically get up at night to urinate? 3 Times     Total IPSS Score 3       Quality of Life due to urinary symptoms   If you were to spend the rest of your life with your urinary condition just the way it is now how would you feel about that? Mixed             ROS:  ROS:  A complete review of systems was performed.  All systems are negative except for pertinent findings as noted.   ROS  Allergies  Allergen Reactions  . Rocephin [Ceftriaxone Sodium In Dextrose] Other (See Comments)    Recurrent seizures  . Lipitor [Atorvastatin] Other (See Comments)    Unknown rxn per pt    Outpatient Encounter Medications as  of 11/02/2019  Medication Sig  . acetaminophen (TYLENOL) 325 MG tablet Take 2 tablets (650 mg total) by mouth every 6 (six) hours as needed for mild pain (or Fever >/= 101).  Marland Kitchen albuterol (PROVENTIL) (2.5 MG/3ML) 0.083% nebulizer solution Take 3 mLs (2.5 mg total) by nebulization every 6 (six) hours as needed for wheezing or shortness of breath. (Patient taking differently: Take 2.5 mg by nebulization daily. After bath)  . albuterol (VENTOLIN HFA) 108 (90 Base) MCG/ACT inhaler Inhale 2 puffs into the lungs daily. After bath  . Albuterol Sulfate (PROAIR HFA IN) Inhale 2 puffs into the lungs daily as needed (Shortness of breath).  . carvedilol (COREG) 6.25 MG tablet TAKE  (1)  TABLET TWICE A DAY.  Marland Kitchen donepezil (ARICEPT) 10 MG tablet TAKE 1 TABLET AT BEDTIME  . febuxostat (ULORIC) 40 MG tablet TAKE 1 TABLET DAILY  . fluticasone (FLONASE) 50 MCG/ACT nasal spray SPRAY 1 SPRAY IN EACH NOSTRIL ONCE DAILY. (Patient taking differently: Place 1 spray into  both nostrils daily as needed for allergies or rhinitis. )  . furosemide (LASIX) 20 MG tablet Take 1 tablet (20 mg total) by mouth in the morning.  . gabapentin (NEURONTIN) 400 MG capsule Take 1 capsule (400 mg total) by mouth 2 (two) times daily. (Patient taking differently: Take 400 mg by mouth at bedtime. )  . Ipratropium-Albuterol (COMBIVENT RESPIMAT) 20-100 MCG/ACT AERS respimat Inhale 1 puff into the lungs every 6 (six) hours as needed for wheezing or shortness of breath (As needed for wheezing , cough or shortness of breath). (Patient taking differently: Inhale 1 puff into the lungs daily. )  . isosorbide-hydrALAZINE (BIDIL) 20-37.5 MG tablet Take 1 tablet by mouth 2 (two) times daily.  Marland Kitchen MYRBETRIQ 25 MG TB24 tablet Take 25 mg by mouth in the morning and at bedtime.   . Nutritional Supplements (ENSURE NUTRITION SHAKE) LIQD Take 1 Bottle by mouth 4 (four) times daily. 1 bottle of chocolate flavored Ensure up to 4 times a day--give with ice-please (Patient  taking differently: Take 1 Bottle by mouth daily as needed (When Scott [name] does not eat well). )  . pantoprazole (PROTONIX) 40 MG tablet TAKE 1 TABLET DAILY (Patient taking differently: Take 40 mg by mouth in the morning. )  . PARoxetine (PAXIL) 40 MG tablet Take 40 mg by mouth daily.  . sertraline (ZOLOFT) 50 MG tablet Take 1 tablet (50 mg total) by mouth every morning. (Patient taking differently: Take 50 mg by mouth daily. )  . simvastatin (ZOCOR) 10 MG tablet TAKE 1 TABLET ONCE DAILY IN THE EVENING (Patient taking differently: Take 10 mg by mouth daily. )  . warfarin (COUMADIN) 2.5 MG tablet Take 1 tablet (2.5 mg total) by mouth every evening. Take Tuesday & Friday with 5mg  =7.5mg   . warfarin (COUMADIN) 5 MG tablet Take 1 tablet (5 mg total) by mouth daily. (Patient taking differently: Take 5 mg by mouth at bedtime. Take 2.5 mg on Tuesday and Friday for a total of 7.5 mg)  . [DISCONTINUED] carvedilol (COREG) 6.25 MG tablet TAKE  (1)  TABLET TWICE A DAY.  . [DISCONTINUED] donepezil (ARICEPT) 10 MG tablet Take 1 tablet (10 mg total) by mouth at bedtime. (Patient taking differently: Take 10 mg by mouth daily. )   No facility-administered encounter medications on file as of 11/02/2019.    Past Medical History:  Diagnosis Date  . Arthritis    "all over"  . Atrial fibrillation ([place])   . Benign localized prostatic hyperplasia with lower urinary tract symptoms (LUTS)   . Bradycardia 11/28/2015   Severe-HR in the 20s to 30s following intubation for urological procedure.  Marland Kitchen CAD- non obstructive disease by cath 3/14 cardiologist-  dr hochrein  . Chronic lower back pain   . COPD (chronic obstructive pulmonary disease) ([place])   . DDD (degenerative disc disease)   . Depression   . Dyspnea    w/ exertion and lying down (raise head)  . ETOH abuse   . GERD (gastroesophageal reflux disease)   . Glaucoma   . Glaucoma, both eyes   . History of bladder cancer urologist-- dr Jeffie Pollock   first dx 2012--  s/p TURBT's   . History of DVT of lower extremity 03/2013   bilateral   . History of pulmonary embolus (PE) 04/2013  . HTN (hypertension)   . Hyperlipidemia   . Hypertension   . LBBB (left bundle branch block)    chronic  . Non-ischemic cardiomyopathy ([place])    last echo 01-18-2017, ef  35-40%  . NSVT (nonsustained ventricular tachycardia) ([place])    a. NSVT 06/2012; NSVT also seen during 03/2013 adm. b. Med rx. Not candidate for ICD given adv age.  Marland Kitchen PAD (peripheral artery disease) ([place])   . 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 PVCs (premature ventricular contractions)   . PVD (peripheral vascular disease) ([facility])    Right ABI .75, Left .78 (2006)  . Syncope 06/2012   a. Felt to be postural syncope related to diuretics 06/2012.  Marland Kitchen Systolic and diastolic CHF, chronic ([facility]) cardiologsit-  dr hochrein   a. NICM - patent cors 06/2012, EF 40% at that time. b. 03/2013 eval: EF 20-25%.  . Vision loss, left eye    due to glaucoma  . Wears glasses     Past Surgical History:  Procedure Laterality Date  . AMPUTATION Left 10/16/2014   Procedure: AMPUTATION ABOVE KNEE- LEFT;  Surgeon: Mal Misty, MD;  Location: [facility];  Service: Vascular;  Laterality: Left;  . CARDIAC CATHETERIZATION  05-11-2004  DR [facility]   MINIMAL CORANARY PLAQUE/ NORMAL LVF/ EF 55%/  NON-OBSTRUCTIVE LAD 25%  . CARDIAC CATHETERIZATION  06/18/2012   dr hochrein   mild luminal irregularities of coronaries, ef 45-50%  . CARDIOVASCULAR STRESS TEST  10-15-2010   LOW RISK NUCLEAR STUDY/ NO EVIDENCE OF ISCHEMIA/ NORMAL EF  . CATARACT EXTRACTION W/ INTRAOCULAR LENS  IMPLANT, BILATERAL Bilateral   . CYSTOSCOPY  10/07/2011   Procedure: CYSTOSCOPY;  Surgeon: Malka So, MD;  Location[facility];  Service: Urology;  Laterality: N/A;  . CYSTOSCOPY N/A 10/20/2018   Procedure: CYSTOSCOPY WITH FULGURATION OF PROSTATIC URETHRAL TUMOR;   Surgeon: [name], MD;  Location: [facility];  Service: Urology;  Laterality: N/A;  . CYSTOSCOPY N/A 10/11/2019   Procedure: CYSTOSCOPY;  Surgeon: Cleon Gustin, MD;  Location: [facility];  Service: Urology;  Laterality: N/A;  . CYSTOSCOPY WITH BIOPSY  03/14/2012   Procedure: CYSTOSCOPY WITH BIOPSY;  Surgeon: Malka So, MD;  Location: [facility];  Service: Urology;  Laterality: N/A;  WITH FULGURATION  . CYSTOSCOPY WITH BIOPSY N/A 12/09/2016   Procedure: CYSTOSCOPY WITH BIOPSY AND FULGURATION;  Surgeon: [name], MD;  Location: [facility];  Service: Urology;  Laterality: N/A;  . CYSTOSCOPY WITH BIOPSY N/A 12/20/2017   Procedure: CYSTOSCOPY WITH BIOPSY WITH FULGURATION;  Surgeon: [name], MD;  Location: [facility];  Service: Urology;  Laterality: N/A;  . CYSTOSCOPY WITH BIOPSY N/A 06/09/2018   Procedure: CYSTOSCOPY WITH BIOPSY AND FULGURATION;  Surgeon: [name], MD;  Location: [facility];  Service: Urology;  Laterality: N/A;  . CYSTOSCOPY WITH FULGERATION N/A 01/20/2016   Procedure: CYSTOSCOPY, BIOPSY WITH FULGERATION OF URTHRAL TUMOR;  Surgeon: [name], MD;  Location: [facility];  Service: Urology;  Laterality: N/A;  . CYSTOSCOPY WITH FULGERATION N/A 06/01/2016   Procedure: CYSTOSCOPY WITH FULGERATION urethral tumors and bladder neck;  Surgeon: [name], MD;  Location: [facility];  Service: Urology;  Laterality: N/A;  . SHOULDER SURGERY Left 1970's  . TONSILLECTOMY    . TRANSTHORACIC ECHOCARDIOGRAM  01-18-2017   dr hochrein   moderate LVH, ef 35-40%, diffuse hypokinesis/  trivial AR and MR/  mild TR  . TRANSURETHRAL RESECTION OF BLADDER TUMOR  10/07/2011   Procedure: TRANSURETHRAL RESECTION OF BLADDER TUMOR (TURBT);  Surgeon: Malka So, MD;  Location: [facility];  Service: Urology;  Laterality: N/A;  .  TRANSURETHRAL RESECTION OF BLADDER TUMOR N/A 07/10/2015   Procedure: TRANSURETHRAL RESECTION OF BLADDER TUMOR (TURBT), CYSTOSCOPY ;  Surgeon: [name], MD;  Location: [facility];  Service:  Urology;  Laterality: N/A;  . TRANSURETHRAL RESECTION OF BLADDER TUMOR N/A 10/11/2019   Procedure: TRANSURETHRAL RESECTION OF PROSTATIC URETHRAL TUMOR;  Surgeon: Cleon Gustin, MD;  Location: [facility];  Service: Urology;  Laterality: N/A;  . TRANSURETHRAL RESECTION OF PROSTATE  03/14/2012   Procedure: TRANSURETHRAL RESECTION OF THE PROSTATE WITH GYRUS INSTRUMENTS;  Surgeon: Malka So, MD;  Location[facility];  Service: Urology;  Laterality: N/A;    Social History   Socioeconomic History  . Marital status: Married    Spouse name: Not on file  . Number of children: 1  . Years of education: Not on file  . Highest education level: Not on file  Occupational History  . Occupation: Retired  Tobacco Use  . Smoking status: Former Smoker    Packs/day: 1.00    Years: 35.00    Pack years: 35.00    Types: Cigarettes    Start date: 04/12/1942    Quit date: 04/13/1975    Years since quitting: 44.5  . Smokeless tobacco: Never Used  Vaping Use  . Vaping Use: Never used  Substance and Sexual Activity  . Alcohol use: Not Currently    Alcohol/week: 7.0 standard drinks    Types: 7 Cans of beer per week  . Drug use: No  . Sexual activity: Never  Other Topics Concern  . Not on file  Social History Narrative   Lives in [place] with spouse who is s/p spinal cord injury.  Scott [name] has one grown son who lives in [place].  Scott [name] is retired but worked for [employer] for years and lives in [place].   Social Determinants of Health   Financial Resource Strain:   . Difficulty of Paying Living Expenses:   Food Insecurity:   . Worried About Charity fundraiser in the Last Year:   . Arboriculturist in the Last Year:   Transportation Needs:   . Film/video editor (Medical):   Marland Kitchen Lack of Transportation (Non-Medical):   Physical Activity:   . Days of Exercise per Week:   . Minutes of Exercise per Session:   Stress:   . Feeling of Stress :   Social Connections:   . Frequency of Communication with  Friends and Family:   . Frequency of Social Gatherings with Friends and Family:   . Attends Religious Services:   . Active Member of Clubs or Organizations:   . Attends Archivist Meetings:   Marland Kitchen Marital Status:   Intimate Partner Violence:   . Fear of Current or Ex-Partner:   . Emotionally Abused:   Marland Kitchen Physically Abused:   . Sexually Abused:     Family History  Problem Relation Age of Onset  . Hypertension Mother   . Arthritis Mother   . Lung cancer Sister   . Deep vein thrombosis Father   . Early death Brother        Objective: Vitals:   11/02/19 1034  BP: (!) 162/76  Pulse: 88  Temp: 98.6 F (37 C)     Physical Exam  Lab Results:  No results found. However, due to the size of the patient record, not all encounters were searched. Please check Results Review for a complete set of results.  BMET No results for input(s): NA, K, CL, CO2, GLUCOSE, BUN,  CREATININE, CALCIUM in the last 72 hours. PSA PSA  Date Value Ref Range Status  04/11/2015 1.08 0.00 - 4.00 ng/mL Final    Comment:    (NOTE) While PSA levels of <=4.0 ng/ml are reported as reference range, some men with levels below 4.0 ng/ml can have prostate cancer and many men with PSA above 4.0 ng/ml do not have prostate cancer.  Other tests such as free PSA, age specific reference ranges, PSA velocity and PSA doubling time may be helpful especially in men less than 32 years old. Performed at [facility]   12/25/2010 1.49 <=4.00 ng/mL Final    Comment:    Test Methodology: ECLIA PSA (Electrochemiluminescence Immunoassay)   No results found for: TESTOSTERONE    Studies/Results: I have reviewed his path report.     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  I will have him return for cystoscopy in 3 months.   Gross hematuria Scott [name] has recurrent hematuria with resumption of warfarin but it has improved  since stopping the med.  Scott [name] is to have another INR on Monday and I have recommended holding the warfarin until the urine has been clear for 48 hrs.       No orders of the defined types were placed in this encounter.    Orders Placed This Encounter  Procedures  . Urine Culture    Standing Status:   Future    Standing Expiration Date:   12/03/2019  . Urinalysis, Routine w reflex microscopic    Standing Status:   Future    Standing Expiration Date:   12/03/2019      Return in about 3 months (around 02/02/2020) for for cystoscopy.   CC: Dettinger, Fransisca Kaufmann, MD      [name] 11/02/2019

## 2019-11-02 ENCOUNTER — Ambulatory Visit: Payer: BC Managed Care – PPO | Admitting: Urology

## 2019-11-02 ENCOUNTER — Telehealth: Payer: Self-pay | Admitting: Urology

## 2019-11-02 ENCOUNTER — Encounter: Payer: Self-pay | Admitting: Urology

## 2019-11-02 ENCOUNTER — Ambulatory Visit (INDEPENDENT_AMBULATORY_CARE_PROVIDER_SITE_OTHER): Payer: Medicare Other | Admitting: Urology

## 2019-11-02 ENCOUNTER — Other Ambulatory Visit: Payer: Self-pay

## 2019-11-02 ENCOUNTER — Encounter (HOSPITAL_COMMUNITY)
Admission: AD | Admit: 2019-11-02 | Discharge: 2019-11-02 | Disposition: A | Discharge status: Home / Self Care | Payer: Medicare Other | Source: Other Acute Inpatient Hospital | Attending: Urology | Admitting: Urology

## 2019-11-02 VITALS — BP 162/76 | HR 88 | Temp 98.6°F | Ht 66.5 in | Wt 152.0 lb

## 2019-11-02 DIAGNOSIS — C680 Malignant neoplasm of urethra: Secondary | ICD-10-CM | POA: Insufficient documentation

## 2019-11-02 DIAGNOSIS — R310 Gross hematuria: Secondary | ICD-10-CM | POA: Insufficient documentation

## 2019-11-02 DIAGNOSIS — Z87440 Personal history of urinary (tract) infections: Secondary | ICD-10-CM

## 2019-11-02 LAB — URINALYSIS, ROUTINE W REFLEX MICROSCOPIC
Bilirubin Urine: NEGATIVE
Glucose, UA: NEGATIVE mg/dL
Ketones, ur: NEGATIVE mg/dL
Leukocytes,Ua: NEGATIVE
Nitrite: NEGATIVE
Protein, ur: 100 mg/dL — AB
RBC / HPF: >50 RBC/hpf — ABNORMAL HIGH (ref 0–5)
Specific Gravity, Urine: 1.015 (ref 1.005–1.030)
WBC, UA: >50 WBC/hpf — ABNORMAL HIGH (ref 0–5)
pH: 6 (ref 5.0–8.0)

## 2019-11-02 NOTE — Progress Notes (Signed)
Urological Symptom Review  Patient is experiencing the following symptoms: Blood in urine   Review of Systems  Gastrointestinal (upper)  : Negative for upper GI symptoms  Gastrointestinal (lower) : Negative for lower GI symptoms  Constitutional : Negative for symptoms  Skin: Negative for skin symptoms  Eyes: Negative for eye symptoms  Ear/Nose/Throat : Negative for Ear/Nose/Throat symptoms  Hematologic/Lymphatic: Negative for Hematologic/Lymphatic symptoms  Cardiovascular : Negative for cardiovascular symptoms  Respiratory : Negative for respiratory symptoms  Endocrine: Negative for endocrine symptoms  Musculoskeletal: Negative for musculoskeletal symptoms  Neurological: Negative for neurological symptoms  Psychologic: Negative for psychiatric symptoms  

## 2019-11-02 NOTE — Assessment & Plan Note (Signed)
He had Low grade noninvasive urothelial carcinoma in the prostatic urethra.  I will have him return for cystoscopy in 3 months.

## 2019-11-02 NOTE — Assessment & Plan Note (Signed)
He has recurrent hematuria with resumption of warfarin but it has improved since stopping the med.  He is to have another INR on Monday and I have recommended holding the warfarin until the urine has been clear for 48 hrs.

## 2019-11-02 NOTE — Telephone Encounter (Signed)
Attempted to call Pharmacy but were closed.

## 2019-11-02 NOTE — Telephone Encounter (Signed)
Pt. Was seen in office today.

## 2019-11-02 NOTE — Telephone Encounter (Signed)
[facility] called and stated they had questions regarding the patients prescription that was sent in. Would like a call back.

## 2019-11-04 LAB — URINE CULTURE: Culture: >=100000 — AB

## 2019-11-05 ENCOUNTER — Other Ambulatory Visit: Payer: Self-pay

## 2019-11-05 DIAGNOSIS — Z87440 Personal history of urinary (tract) infections: Secondary | ICD-10-CM

## 2019-11-05 DIAGNOSIS — C680 Malignant neoplasm of urethra: Secondary | ICD-10-CM

## 2019-11-05 DIAGNOSIS — N138 Other obstructive and reflux uropathy: Secondary | ICD-10-CM

## 2019-11-05 DIAGNOSIS — N401 Enlarged prostate with lower urinary tract symptoms: Secondary | ICD-10-CM

## 2019-11-05 MED ORDER — MYRBETRIQ 25 MG PO TB24: 25.0000 mg | ORAL_TABLET | Freq: Two times a day (BID) | ORAL | 3 refills | Status: DC

## 2019-11-05 NOTE — Telephone Encounter (Signed)
Talked with pharmacy and refilled Myrbetriq

## 2019-11-06 ENCOUNTER — Telehealth: Payer: Self-pay | Admitting: *Deleted

## 2019-11-06 ENCOUNTER — Other Ambulatory Visit: Payer: Self-pay

## 2019-11-06 DIAGNOSIS — Z87440 Personal history of urinary (tract) infections: Secondary | ICD-10-CM

## 2019-11-06 DIAGNOSIS — Z86711 Personal history of pulmonary embolism: Secondary | ICD-10-CM

## 2019-11-06 DIAGNOSIS — I4891 Unspecified atrial fibrillation: Secondary | ICD-10-CM

## 2019-11-06 MED ORDER — CIPROFLOXACIN HCL 250 MG PO TABS: 250.0000 mg | ORAL_TABLET | Freq: Two times a day (BID) | ORAL | 0 refills | Status: AC

## 2019-11-06 NOTE — Telephone Encounter (Signed)
Pt. called and made aware. My chart message sent to PCP concerning INR monitoring.

## 2019-11-06 NOTE — Telephone Encounter (Signed)
Fax received mdINR PT/INR self testing service Test date/time 10/29/19 152 pm INR 3.5

## 2019-11-06 NOTE — Telephone Encounter (Signed)
Thanks. Just let me know the result.

## 2019-11-06 NOTE — Telephone Encounter (Signed)
Pt has been taken off blood thinner off & on since 10/11/19 d/t having lots of blood & clots in urine from bladder cancer. Urologist took him off coumadin again this past Friday restarted Monday and will retest INR today

## 2019-11-06 NOTE — Telephone Encounter (Signed)
The timing is too old. Can he repeat?

## 2019-11-06 NOTE — Telephone Encounter (Signed)
LMOVM for caregiver Remo Lipps to rtc

## 2019-11-06 NOTE — Telephone Encounter (Signed)
-----   Message from [name], MD sent at 11/06/2019  9:44 AM EDT ----- Cipro 250mg  po bid #14 but he will need to have his INR monitored if he is back on the warfarin.   ----- Message ----- From: Dorisann Frames, RN Sent: 11/06/2019   7:46 AM EDT To: [name], MD  UA

## 2019-11-07 ENCOUNTER — Telehealth: Payer: Self-pay | Admitting: *Deleted

## 2019-11-07 DIAGNOSIS — Z86711 Personal history of pulmonary embolism: Secondary | ICD-10-CM

## 2019-11-07 DIAGNOSIS — I4891 Unspecified atrial fibrillation: Secondary | ICD-10-CM

## 2019-11-07 NOTE — Telephone Encounter (Signed)
Fax received mdINR PT/INR self testing service Test date/time 11/07/19 10:58 am INR 2.7

## 2019-11-07 NOTE — Telephone Encounter (Signed)
INR therapeutic. Continue current regimen. Recheck in 1 week 

## 2019-11-07 NOTE — Telephone Encounter (Signed)
Patient aware and verbalized understanding. °

## 2019-11-12 ENCOUNTER — Telehealth: Payer: Self-pay | Admitting: *Deleted

## 2019-11-12 DIAGNOSIS — Z86711 Personal history of pulmonary embolism: Secondary | ICD-10-CM

## 2019-11-12 DIAGNOSIS — I4891 Unspecified atrial fibrillation: Secondary | ICD-10-CM

## 2019-11-12 DIAGNOSIS — I4821 Permanent atrial fibrillation: Secondary | ICD-10-CM | POA: Diagnosis not present

## 2019-11-12 NOTE — Telephone Encounter (Signed)
Description   Continue 7.5 mg on Tuesdays and Fridays. 5 mg the rest of the week  INR 2.5 (goal 2.0 -3.0) Recheck in 2 weeks     Caryl Pina, MD [facility] 11/12/2019, 3:17 PM

## 2019-11-12 NOTE — Telephone Encounter (Signed)
Patient aware and verbalized understanding. °

## 2019-11-12 NOTE — Telephone Encounter (Signed)
Fax received mdINR PT/INR self testing service Test date/time 11/12/19 1216pm INR 2.5

## 2019-11-13 ENCOUNTER — Other Ambulatory Visit: Payer: Self-pay | Admitting: Family Medicine

## 2019-11-13 DIAGNOSIS — I5042 Chronic combined systolic (congestive) and diastolic (congestive) heart failure: Secondary | ICD-10-CM

## 2019-11-14 ENCOUNTER — Other Ambulatory Visit: Payer: Self-pay | Admitting: Family Medicine

## 2019-11-15 ENCOUNTER — Other Ambulatory Visit: Payer: Self-pay | Admitting: Family Medicine

## 2019-11-19 ENCOUNTER — Telehealth: Payer: Self-pay | Admitting: *Deleted

## 2019-11-19 DIAGNOSIS — Z86711 Personal history of pulmonary embolism: Secondary | ICD-10-CM

## 2019-11-19 DIAGNOSIS — I4891 Unspecified atrial fibrillation: Secondary | ICD-10-CM

## 2019-11-19 NOTE — Telephone Encounter (Signed)
Pt niece advised of provider feedback and voiced understanding.

## 2019-11-19 NOTE — Telephone Encounter (Signed)
Fax received mdINR PT/INR self testing service Test date/time 11/19/19 856 am INR 3.4

## 2019-11-19 NOTE — Telephone Encounter (Signed)
I spoke with his niece but she is unsure of what medication he has already taken today and what he hasn't so she will call his Aid and then call us back.

## 2019-11-19 NOTE — Telephone Encounter (Signed)
Description   2 high, hold today's and then take 7.5 mg on Fridays only. 5 mg the rest of the week May need to notify pharmacy if he gets prepackaged medications, cannot recall if he does  INR 3.4 (goal 2.0 -3.0) Recheck in 2 weeks    Caryl Pina, MD [facility] 11/19/2019, 3:26 PM

## 2019-11-26 ENCOUNTER — Telehealth: Payer: Self-pay | Admitting: *Deleted

## 2019-11-26 ENCOUNTER — Other Ambulatory Visit: Payer: Self-pay | Admitting: Family Medicine

## 2019-11-26 DIAGNOSIS — I4891 Unspecified atrial fibrillation: Secondary | ICD-10-CM

## 2019-11-26 DIAGNOSIS — Z86711 Personal history of pulmonary embolism: Secondary | ICD-10-CM

## 2019-11-26 LAB — POCT INR: INR: 3.6 — AB (ref 2.0–3.0)

## 2019-11-26 NOTE — Telephone Encounter (Signed)
Change coumadin as follows:  Description   Hold today then 5 mg ( 2 tablets of 2.5 mg) daily

## 2019-11-26 NOTE — Telephone Encounter (Signed)
Aware of coumadin dosage

## 2019-11-26 NOTE — Telephone Encounter (Signed)
Fax received mdINR PT/INR self testing service Test date/time 11/26/19 931 am INR 3.6

## 2019-12-05 ENCOUNTER — Other Ambulatory Visit: Payer: Self-pay | Admitting: Family

## 2019-12-06 ENCOUNTER — Telehealth: Payer: Self-pay | Admitting: *Deleted

## 2019-12-06 DIAGNOSIS — Z86711 Personal history of pulmonary embolism: Secondary | ICD-10-CM

## 2019-12-06 DIAGNOSIS — I4891 Unspecified atrial fibrillation: Secondary | ICD-10-CM

## 2019-12-06 LAB — POCT INR: INR: 3.3 — AB (ref 2.0–3.0)

## 2019-12-06 NOTE — Telephone Encounter (Signed)
Aware and verbalizes understanding.  

## 2019-12-06 NOTE — Telephone Encounter (Signed)
Description   Hold today then 5 mg ( 2 tablets of 2.5 mg) daily except on Fridays, only take 1 tablet  3.3 today, INR goal 2.0-3.0  Recheck in 1 to 2 weeks    Caryl Pina, MD [facility] 12/06/2019, 4:23 PM

## 2019-12-06 NOTE — Telephone Encounter (Signed)
Fax received mdINR PT/INR self testing service Test date/time 12/06/19 12:30PM INR 3.3

## 2019-12-11 ENCOUNTER — Telehealth: Payer: Self-pay | Admitting: *Deleted

## 2019-12-11 DIAGNOSIS — I4821 Permanent atrial fibrillation: Secondary | ICD-10-CM | POA: Diagnosis not present

## 2019-12-11 DIAGNOSIS — I4891 Unspecified atrial fibrillation: Secondary | ICD-10-CM

## 2019-12-11 DIAGNOSIS — Z86711 Personal history of pulmonary embolism: Secondary | ICD-10-CM

## 2019-12-11 NOTE — Telephone Encounter (Signed)
Aware and verbalizes understanding.  

## 2019-12-11 NOTE — Telephone Encounter (Signed)
Continue 5 mg ( 2 tablets of 2.5 mg) daily except on Fridays, only take 1 tablet  2.9 today, INR goal 2.0-3.0  Recheck in 1 to 2 weeks

## 2019-12-11 NOTE — Telephone Encounter (Signed)
Fax received mdINR PT/INR self testing service Test date/time 12/11/19 1212 pm INR 2.9

## 2019-12-12 ENCOUNTER — Other Ambulatory Visit: Payer: Self-pay | Admitting: Family Medicine

## 2019-12-13 ENCOUNTER — Telehealth: Payer: Self-pay

## 2019-12-13 MED ORDER — BIDIL 20-37.5 MG PO TABS: 1.0000 | ORAL_TABLET | Freq: Two times a day (BID) | ORAL | 0 refills | Status: DC

## 2019-12-13 NOTE — Telephone Encounter (Signed)
I do have it on my list, just the isosorbide hydralazine, please call the family and verify that the patient is still taking because this prescription was written a long time ago and I have not had a physical visit with this patient in quite some time, I have been managing his INRs but I have not had a visit with him in quite some time, we need to verify that he is still on this, then he likely also needs an appointment to come see me.  We need to sort some of these things out.

## 2019-12-13 NOTE — Telephone Encounter (Signed)
Pharmacy sent fax requesting refill on Bidil tablet. No strength listed. I do not see on med list. Original prescriber according to the fax was [facility]. Please advise

## 2019-12-13 NOTE — Telephone Encounter (Signed)
Spoke with Nidra one of his caregivers and she says he is taking the isosorbide-hydralazine twice daily. She is going to have his son call us to schedule an appt.

## 2019-12-13 NOTE — Telephone Encounter (Signed)
Caregiver will have son call back to schedule appt.

## 2019-12-13 NOTE — Telephone Encounter (Signed)
I sent a month refill for the patient and have him schedule an appointment so he can be seen.

## 2019-12-18 DIAGNOSIS — B351 Tinea unguium: Secondary | ICD-10-CM | POA: Diagnosis not present

## 2019-12-18 DIAGNOSIS — M79676 Pain in unspecified toe(s): Secondary | ICD-10-CM | POA: Diagnosis not present

## 2019-12-19 ENCOUNTER — Ambulatory Visit (INDEPENDENT_AMBULATORY_CARE_PROVIDER_SITE_OTHER): Payer: Medicare Other | Admitting: Family Medicine

## 2019-12-19 ENCOUNTER — Encounter: Payer: Self-pay | Admitting: Family Medicine

## 2019-12-19 ENCOUNTER — Other Ambulatory Visit: Payer: Self-pay

## 2019-12-19 VITALS — BP 138/66 | HR 80 | Temp 98.2°F

## 2019-12-19 DIAGNOSIS — E7800 Pure hypercholesterolemia, unspecified: Secondary | ICD-10-CM

## 2019-12-19 DIAGNOSIS — I5042 Chronic combined systolic (congestive) and diastolic (congestive) heart failure: Secondary | ICD-10-CM | POA: Diagnosis not present

## 2019-12-19 DIAGNOSIS — G308 Other Alzheimer's disease: Secondary | ICD-10-CM

## 2019-12-19 DIAGNOSIS — I4891 Unspecified atrial fibrillation: Secondary | ICD-10-CM | POA: Diagnosis not present

## 2019-12-19 DIAGNOSIS — Z86711 Personal history of pulmonary embolism: Secondary | ICD-10-CM | POA: Diagnosis not present

## 2019-12-19 DIAGNOSIS — I10 Essential (primary) hypertension: Secondary | ICD-10-CM | POA: Diagnosis not present

## 2019-12-19 DIAGNOSIS — I480 Paroxysmal atrial fibrillation: Secondary | ICD-10-CM | POA: Diagnosis not present

## 2019-12-19 DIAGNOSIS — F0280 Dementia in other diseases classified elsewhere without behavioral disturbance: Secondary | ICD-10-CM | POA: Diagnosis not present

## 2019-12-19 LAB — CBC WITH DIFFERENTIAL/PLATELET
Basophils Absolute: 0 10*3/uL (ref 0.0–0.2)
Basos: 1 %
EOS (ABSOLUTE): 0.1 10*3/uL (ref 0.0–0.4)
Eos: 2 %
Hematocrit: 37.2 % — ABNORMAL LOW (ref 37.5–51.0)
Hemoglobin: 11.8 g/dL — ABNORMAL LOW (ref 13.0–17.7)
Immature Grans (Abs): 0 10*3/uL (ref 0.0–0.1)
Immature Granulocytes: 0 %
Lymphocytes Absolute: 0.9 10*3/uL (ref 0.7–3.1)
Lymphs: 27 %
MCH: 26.1 pg — ABNORMAL LOW (ref 26.6–33.0)
MCHC: 31.7 g/dL (ref 31.5–35.7)
MCV: 82 fL (ref 79–97)
Monocytes Absolute: 0.3 10*3/uL (ref 0.1–0.9)
Monocytes: 9 %
Neutrophils Absolute: 2.1 10*3/uL (ref 1.4–7.0)
Neutrophils: 61 %
Platelets: 187 10*3/uL (ref 150–450)
RBC: 4.52 x10E6/uL (ref 4.14–5.80)
RDW: 14.9 % (ref 11.6–15.4)
WBC: 3.5 10*3/uL (ref 3.4–10.8)

## 2019-12-19 LAB — CMP14+EGFR
ALT: 10 IU/L (ref 0–44)
AST: 16 IU/L (ref 0–40)
Albumin/Globulin Ratio: 1.8 (ref 1.2–2.2)
Albumin: 4.1 g/dL (ref 3.5–4.6)
Alkaline Phosphatase: 166 IU/L — ABNORMAL HIGH (ref 48–121)
BUN/Creatinine Ratio: 19 (ref 10–24)
BUN: 24 mg/dL (ref 10–36)
Bilirubin Total: 0.5 mg/dL (ref 0.0–1.2)
CO2: 24 mmol/L (ref 20–29)
Calcium: 9.8 mg/dL (ref 8.6–10.2)
Chloride: 108 mmol/L — ABNORMAL HIGH (ref 96–106)
Creatinine, Ser: 1.25 mg/dL (ref 0.76–1.27)
GFR calc Af Amer: 56 mL/min/{1.73_m2} — ABNORMAL LOW (ref >59)
GFR calc non Af Amer: 48 mL/min/{1.73_m2} — ABNORMAL LOW (ref >59)
Globulin, Total: 2.3 g/dL (ref 1.5–4.5)
Glucose: 140 mg/dL — ABNORMAL HIGH (ref 65–99)
Potassium: 3.8 mmol/L (ref 3.5–5.2)
Sodium: 146 mmol/L — ABNORMAL HIGH (ref 134–144)
Total Protein: 6.4 g/dL (ref 6.0–8.5)

## 2019-12-19 LAB — LIPID PANEL
Chol/HDL Ratio: 2.7 ratio (ref 0.0–5.0)
Cholesterol, Total: 115 mg/dL (ref 100–199)
HDL: 42 mg/dL (ref >39)
LDL Chol Calc (NIH): 57 mg/dL (ref 0–99)
Triglycerides: 83 mg/dL (ref 0–149)
VLDL Cholesterol Cal: 16 mg/dL (ref 5–40)

## 2019-12-19 LAB — COAGUCHEK XS/INR WAIVED
INR: 5.1 (ref 0.9–1.1)
Prothrombin Time: 61 s

## 2019-12-19 MED ORDER — WARFARIN SODIUM 2.5 MG PO TABS: 2.5000 mg | ORAL_TABLET | Freq: Every day | ORAL | 2 refills | Status: DC

## 2019-12-19 MED ORDER — WARFARIN SODIUM 5 MG PO TABS
5.0000 mg | ORAL_TABLET | Freq: Every day | ORAL | 3 refills | Status: DC
Start: 2019-12-19 — End: 2020-01-10

## 2019-12-19 MED ORDER — FUROSEMIDE 20 MG PO TABS
20.0000 mg | ORAL_TABLET | Freq: Every day | ORAL | 0 refills | Status: DC
Start: 2019-12-20 — End: 2019-12-21

## 2019-12-19 NOTE — Progress Notes (Signed)
BP 138/66   Pulse 80   Temp 98.2 F (36.8 C) (Temporal)    Subjective:   Patient ID: Scott [name], male    DOB: 1921-10-25, 86 y.o.   MRN: [id number]  HPI: Scott [name] is a 86 y.o. male presenting on 12/19/2019 for Follow-up (check up changes, urology procedure, INR has been out of range)   HPI Hypertension Patient is currently on carvedilol and furosemide and BiDil, and their blood pressure today is 138/66. Patient denies any lightheadedness or dizziness. Patient denies headaches, blurred vision, chest pains, shortness of breath, or weakness. Denies any side effects from medication and is content with current medication.   CHF Patient is coming in for CHF recheck.  Is currently taking carvedilol and furosemide and BiDil and her his son who is here with him says that he has been stable and doing well with his breathing.  He does get a little more swelling in his legs at times.  Hyperlipidemia Patient is coming in for recheck of his hyperlipidemia. The patient is currently taking simvastatin. They deny any issues with myalgias or history of liver damage from it. They deny any focal numbness or weakness or chest pain.   Coumadin recheck Target goal: 2.0-3.0 Reason on anticoagulation: History of DVTs and PE and A. fib Patient denies any bruising or bleeding or chest pain or palpitations   Alzheimer's recheck Patient is coming in for Alzheimer's recheck.  Is currently taking donepezil and his son does not know that its been helping her hurting but does want to continue forward with it, he thinks it may be helping a little bit.  The memory is still gradually worsening.  Relevant past medical, surgical, family and social history reviewed and updated as indicated. Interim medical history since our last visit reviewed. Allergies and medications reviewed and updated.  Review of Systems  Constitutional: Negative for chills and fever.  Eyes: Negative for visual disturbance.  Respiratory:  Positive for shortness of breath. Negative for wheezing.   Cardiovascular: Positive for leg swelling. Negative for chest pain.  Musculoskeletal: Negative for back pain and gait problem.  Skin: Negative for rash.  Neurological: Negative for dizziness, weakness and light-headedness.  Psychiatric/Behavioral: Positive for confusion.  All other systems reviewed and are negative.   Per HPI unless specifically indicated above   Allergies as of 12/19/2019      Reactions   Rocephin [ceftriaxone Sodium In Dextrose] Other (See Comments)   Recurrent seizures   Lipitor [atorvastatin] Other (See Comments)   Unknown rxn per pt      Medication List       Accurate as of December 19, 2019  9:56 AM. If you have any questions, ask your nurse or doctor.        acetaminophen 325 MG tablet Commonly known as: TYLENOL Take 2 tablets (650 mg total) by mouth every 6 (six) hours as needed for mild pain (or Fever >/= 101).   albuterol 108 (90 Base) MCG/ACT inhaler Commonly known as: VENTOLIN HFA Inhale 2 puffs into the lungs daily. After bath What changed: Another medication with the same name was changed. Make sure you understand how and when to take each.   PROAIR HFA IN Inhale 2 puffs into the lungs daily as needed (Shortness of breath). What changed: Another medication with the same name was changed. Make sure you understand how and when to take each.   albuterol (2.5 MG/3ML) 0.083% nebulizer solution Commonly known as: PROVENTIL Take 3 mLs (2.5  mg total) by nebulization every 6 (six) hours as needed for wheezing or shortness of breath. What changed:   when to take this  additional instructions   BiDil 20-37.5 MG tablet Generic drug: isosorbide-hydrALAZINE Take 1 tablet by mouth 2 (two) times daily.   carvedilol 6.25 MG tablet Commonly known as: COREG TAKE  (1)  TABLET TWICE A DAY.   Combivent Respimat 20-100 MCG/ACT Aers respimat Generic drug: Ipratropium-Albuterol Inhale 1 puff into  the lungs every 6 (six) hours as needed for wheezing or shortness of breath (As needed for wheezing , cough or shortness of breath). What changed: when to take this   donepezil 10 MG tablet Commonly known as: ARICEPT TAKE 1 TABLET AT BEDTIME   Ensure Nutrition Shake Liqd Take 1 Bottle by mouth 4 (four) times daily. 1 bottle of chocolate flavored Ensure up to 4 times a day--give with ice-please What changed:   when to take this  reasons to take this  additional instructions   febuxostat 40 MG tablet Commonly known as: ULORIC TAKE 1 TABLET DAILY   fluticasone 50 MCG/ACT nasal spray Commonly known as: FLONASE SPRAY 1 SPRAY IN EACH NOSTRIL ONCE DAILY. What changed: See the new instructions.   furosemide 20 MG tablet Commonly known as: LASIX TAKE (1) TABLET DAILY IN THE MORNING.   gabapentin 400 MG capsule Commonly known as: NEURONTIN Take 1 capsule (400 mg total) by mouth 2 (two) times daily. What changed: when to take this   Myrbetriq 25 MG Tb24 tablet Generic drug: mirabegron ER Take 1 tablet (25 mg total) by mouth in the morning and at bedtime.   pantoprazole 40 MG tablet Commonly known as: PROTONIX TAKE 1 TABLET DAILY   PARoxetine 40 MG tablet Commonly known as: PAXIL Take 40 mg by mouth daily.   sertraline 50 MG tablet Commonly known as: ZOLOFT TAKE (1) TABLET DAILY IN THE MORNING.   simvastatin 10 MG tablet Commonly known as: ZOCOR TAKE 1 TABLET ONCE DAILY IN THE EVENING What changed: See the new instructions.   warfarin 5 MG tablet Commonly known as: COUMADIN Take as directed by the anticoagulation clinic. If you are unsure how to take this medication, talk to your nurse or doctor. Original instructions: Take 1 tablet (5 mg total) by mouth daily. What changed:   when to take this  additional instructions   warfarin 2.5 MG tablet Commonly known as: COUMADIN Take as directed by the anticoagulation clinic. If you are unsure how to take this  medication, talk to your nurse or doctor. Original instructions: Take 1 tablet (2.5 mg total) by mouth every evening. Take Tuesday & Friday with 23m =7.532mWhat changed: Another medication with the same name was changed. Make sure you understand how and when to take each.        Objective:   BP 138/66   Pulse 80   Temp 98.2 F (36.8 C) (Temporal)   Wt Readings from Last 3 Encounters:  11/02/19 152 lb (68.9 kg)  10/11/19 150 lb (68 kg)  09/07/19 150 lb (68 kg)    Physical Exam Vitals and nursing note reviewed.  Constitutional:      General: He is not in acute distress.    Appearance: He is well-developed. He is not diaphoretic.  Eyes:     General: No scleral icterus.    Conjunctiva/sclera: Conjunctivae normal.  Neck:     Thyroid: No thyromegaly.  Cardiovascular:     Rate and Rhythm: Normal rate and regular rhythm.  Heart sounds: Normal heart sounds. No murmur heard.   Pulmonary:     Effort: Pulmonary effort is normal. No respiratory distress.     Breath sounds: Rales (Basilar crackles on both sides) present. No wheezing.  Musculoskeletal:        General: Swelling (2+ pitting edema) present. Normal range of motion.     Cervical back: Neck supple.  Lymphadenopathy:     Cervical: No cervical adenopathy.  Skin:    General: Skin is warm and dry.     Findings: No rash.  Neurological:     Mental Status: He is alert and oriented to person, place, and time.     Coordination: Coordination normal.  Psychiatric:        Behavior: Behavior normal.       Assessment & Plan:   Problem List Items Addressed This Visit      Cardiovascular and Mediastinum   Chronic combined systolic and diastolic heart failure (HCC)   Relevant Medications   furosemide (LASIX) 20 MG tablet   warfarin (COUMADIN) 2.5 MG tablet   warfarin (COUMADIN) 5 MG tablet   Other Relevant Orders   CBC with Differential/Platelet (Completed)   Essential hypertension   Relevant Medications   furosemide  (LASIX) 20 MG tablet   warfarin (COUMADIN) 2.5 MG tablet   warfarin (COUMADIN) 5 MG tablet   Other Relevant Orders   CMP14+EGFR (Completed)   Paroxysmal atrial fibrillation (HCC)   Relevant Medications   furosemide (LASIX) 20 MG tablet   warfarin (COUMADIN) 2.5 MG tablet   warfarin (COUMADIN) 5 MG tablet   Other Relevant Orders   CoaguChek XS/INR Waived (Completed)   CBC with Differential/Platelet (Completed)     Nervous and Auditory   Alzheimer disease (HCC)     Other   Hyperlipidemia - Primary (Chronic)   Relevant Medications   furosemide (LASIX) 20 MG tablet   warfarin (COUMADIN) 2.5 MG tablet   warfarin (COUMADIN) 5 MG tablet   Other Relevant Orders   Lipid panel (Completed)   History of pulmonary embolism (2014)      Description   Hold Coumadin tomorrow and the next day and then take 2-1/2 mg on Fridays Saturdays and Mondays and 5 mg the rest of the week  5.1 today, INR goal 2.0-3.0  Recheck in 1 week    Will change Lasix to give an extra dose for few days.  Will monitor closely.  See if breathing improves from there. Follow up plan: Return in about 3 months (around 03/19/2020), or if symptoms worsen or fail to improve, for Recheck hypertension and CHF and COPD.    Counseling provided for all of the vaccine components Orders Placed This Encounter  Procedures  . CoaguChek XS/INR Alger, MD [facility] 12/19/2019, 9:56 AM

## 2019-12-20 ENCOUNTER — Emergency Department (HOSPITAL_COMMUNITY): Payer: Medicare Other

## 2019-12-20 ENCOUNTER — Inpatient Hospital Stay (HOSPITAL_COMMUNITY)
Admission: EM | Admit: 2019-12-20 | Discharge: 2019-12-23 | DRG: 291 | Disposition: A | Discharge status: Home Health | Payer: Medicare Other | Attending: Internal Medicine | Admitting: Internal Medicine

## 2019-12-20 ENCOUNTER — Other Ambulatory Visit: Payer: Self-pay

## 2019-12-20 DIAGNOSIS — N1831 Chronic kidney disease, stage 3a: Secondary | ICD-10-CM | POA: Diagnosis present

## 2019-12-20 DIAGNOSIS — F0280 Dementia in other diseases classified elsewhere without behavioral disturbance: Secondary | ICD-10-CM | POA: Diagnosis present

## 2019-12-20 DIAGNOSIS — Z66 Do not resuscitate: Secondary | ICD-10-CM | POA: Diagnosis present

## 2019-12-20 DIAGNOSIS — F329 Major depressive disorder, single episode, unspecified: Secondary | ICD-10-CM | POA: Diagnosis present

## 2019-12-20 DIAGNOSIS — I739 Peripheral vascular disease, unspecified: Secondary | ICD-10-CM | POA: Diagnosis present

## 2019-12-20 DIAGNOSIS — Z20822 Contact with and (suspected) exposure to covid-19: Secondary | ICD-10-CM | POA: Diagnosis present

## 2019-12-20 DIAGNOSIS — J9601 Acute respiratory failure with hypoxia: Secondary | ICD-10-CM | POA: Diagnosis not present

## 2019-12-20 DIAGNOSIS — Z86711 Personal history of pulmonary embolism: Secondary | ICD-10-CM | POA: Diagnosis not present

## 2019-12-20 DIAGNOSIS — Z87891 Personal history of nicotine dependence: Secondary | ICD-10-CM | POA: Diagnosis not present

## 2019-12-20 DIAGNOSIS — D649 Anemia, unspecified: Secondary | ICD-10-CM | POA: Diagnosis present

## 2019-12-20 DIAGNOSIS — J9621 Acute and chronic respiratory failure with hypoxia: Secondary | ICD-10-CM | POA: Diagnosis present

## 2019-12-20 DIAGNOSIS — R791 Abnormal coagulation profile: Secondary | ICD-10-CM

## 2019-12-20 DIAGNOSIS — I10 Essential (primary) hypertension: Secondary | ICD-10-CM | POA: Diagnosis present

## 2019-12-20 DIAGNOSIS — I5042 Chronic combined systolic (congestive) and diastolic (congestive) heart failure: Secondary | ICD-10-CM

## 2019-12-20 DIAGNOSIS — R531 Weakness: Secondary | ICD-10-CM | POA: Diagnosis not present

## 2019-12-20 DIAGNOSIS — I517 Cardiomegaly: Secondary | ICD-10-CM | POA: Diagnosis not present

## 2019-12-20 DIAGNOSIS — I130 Hypertensive heart and chronic kidney disease with heart failure and stage 1 through stage 4 chronic kidney disease, or unspecified chronic kidney disease: Secondary | ICD-10-CM | POA: Diagnosis present

## 2019-12-20 DIAGNOSIS — Z79899 Other long term (current) drug therapy: Secondary | ICD-10-CM | POA: Diagnosis not present

## 2019-12-20 DIAGNOSIS — I480 Paroxysmal atrial fibrillation: Secondary | ICD-10-CM | POA: Diagnosis present

## 2019-12-20 DIAGNOSIS — K219 Gastro-esophageal reflux disease without esophagitis: Secondary | ICD-10-CM | POA: Diagnosis present

## 2019-12-20 DIAGNOSIS — Z86718 Personal history of other venous thrombosis and embolism: Secondary | ICD-10-CM

## 2019-12-20 DIAGNOSIS — I5043 Acute on chronic combined systolic (congestive) and diastolic (congestive) heart failure: Secondary | ICD-10-CM

## 2019-12-20 DIAGNOSIS — J449 Chronic obstructive pulmonary disease, unspecified: Secondary | ICD-10-CM | POA: Diagnosis present

## 2019-12-20 DIAGNOSIS — R05 Cough: Secondary | ICD-10-CM | POA: Diagnosis not present

## 2019-12-20 DIAGNOSIS — I2510 Atherosclerotic heart disease of native coronary artery without angina pectoris: Secondary | ICD-10-CM | POA: Diagnosis present

## 2019-12-20 DIAGNOSIS — Z801 Family history of malignant neoplasm of trachea, bronchus and lung: Secondary | ICD-10-CM

## 2019-12-20 DIAGNOSIS — Z8551 Personal history of malignant neoplasm of bladder: Secondary | ICD-10-CM

## 2019-12-20 DIAGNOSIS — I509 Heart failure, unspecified: Secondary | ICD-10-CM | POA: Diagnosis not present

## 2019-12-20 DIAGNOSIS — N401 Enlarged prostate with lower urinary tract symptoms: Secondary | ICD-10-CM | POA: Diagnosis present

## 2019-12-20 DIAGNOSIS — G309 Alzheimer's disease, unspecified: Secondary | ICD-10-CM | POA: Diagnosis present

## 2019-12-20 DIAGNOSIS — R310 Gross hematuria: Secondary | ICD-10-CM | POA: Diagnosis present

## 2019-12-20 DIAGNOSIS — H409 Unspecified glaucoma: Secondary | ICD-10-CM | POA: Diagnosis present

## 2019-12-20 DIAGNOSIS — R319 Hematuria, unspecified: Secondary | ICD-10-CM

## 2019-12-20 DIAGNOSIS — I252 Old myocardial infarction: Secondary | ICD-10-CM

## 2019-12-20 DIAGNOSIS — Z89612 Acquired absence of left leg above knee: Secondary | ICD-10-CM

## 2019-12-20 DIAGNOSIS — I5021 Acute systolic (congestive) heart failure: Secondary | ICD-10-CM | POA: Diagnosis not present

## 2019-12-20 DIAGNOSIS — I5022 Chronic systolic (congestive) heart failure: Secondary | ICD-10-CM | POA: Diagnosis present

## 2019-12-20 DIAGNOSIS — J9811 Atelectasis: Secondary | ICD-10-CM | POA: Diagnosis not present

## 2019-12-20 DIAGNOSIS — Z7901 Long term (current) use of anticoagulants: Secondary | ICD-10-CM

## 2019-12-20 DIAGNOSIS — Z8249 Family history of ischemic heart disease and other diseases of the circulatory system: Secondary | ICD-10-CM

## 2019-12-20 DIAGNOSIS — E785 Hyperlipidemia, unspecified: Secondary | ICD-10-CM | POA: Diagnosis present

## 2019-12-20 DIAGNOSIS — J811 Chronic pulmonary edema: Secondary | ICD-10-CM | POA: Diagnosis not present

## 2019-12-20 DIAGNOSIS — I110 Hypertensive heart disease with heart failure: Secondary | ICD-10-CM | POA: Diagnosis not present

## 2019-12-20 LAB — PROTIME-INR
INR: 4.7 (ref 0.8–1.2)
Prothrombin Time: 43 seconds — ABNORMAL HIGH (ref 11.4–15.2)

## 2019-12-20 LAB — CBC WITH DIFFERENTIAL/PLATELET
Abs Immature Granulocytes: 0.01 10*3/uL (ref 0.00–0.07)
Basophils Absolute: 0 10*3/uL (ref 0.0–0.1)
Basophils Relative: 0 %
Eosinophils Absolute: 0.1 10*3/uL (ref 0.0–0.5)
Eosinophils Relative: 2 %
HCT: 38.3 % — ABNORMAL LOW (ref 39.0–52.0)
Hemoglobin: 11.5 g/dL — ABNORMAL LOW (ref 13.0–17.0)
Immature Granulocytes: 0 %
Lymphocytes Relative: 21 %
Lymphs Abs: 1 10*3/uL (ref 0.7–4.0)
MCH: 26.6 pg (ref 26.0–34.0)
MCHC: 30 g/dL (ref 30.0–36.0)
MCV: 88.5 fL (ref 80.0–100.0)
Monocytes Absolute: 0.3 10*3/uL (ref 0.1–1.0)
Monocytes Relative: 6 %
Neutro Abs: 3.3 10*3/uL (ref 1.7–7.7)
Neutrophils Relative %: 71 %
Platelets: 178 10*3/uL (ref 150–400)
RBC: 4.33 MIL/uL (ref 4.22–5.81)
RDW: 16.2 % — ABNORMAL HIGH (ref 11.5–15.5)
WBC: 4.7 10*3/uL (ref 4.0–10.5)
nRBC: 0 % (ref 0.0–0.2)

## 2019-12-20 LAB — COMPREHENSIVE METABOLIC PANEL
ALT: 15 U/L (ref 0–44)
AST: 20 U/L (ref 15–41)
Albumin: 3.9 g/dL (ref 3.5–5.0)
Alkaline Phosphatase: 126 U/L (ref 38–126)
Anion gap: 8 (ref 5–15)
BUN: 22 mg/dL (ref 8–23)
CO2: 25 mmol/L (ref 22–32)
Calcium: 9 mg/dL (ref 8.9–10.3)
Chloride: 109 mmol/L (ref 98–111)
Creatinine, Ser: 1.31 mg/dL — ABNORMAL HIGH (ref 0.61–1.24)
GFR calc Af Amer: 53 mL/min — ABNORMAL LOW (ref >60)
GFR calc non Af Amer: 46 mL/min — ABNORMAL LOW (ref >60)
Glucose, Bld: 109 mg/dL — ABNORMAL HIGH (ref 70–99)
Potassium: 4.3 mmol/L (ref 3.5–5.1)
Sodium: 142 mmol/L (ref 135–145)
Total Bilirubin: 0.8 mg/dL (ref 0.3–1.2)
Total Protein: 7.2 g/dL (ref 6.5–8.1)

## 2019-12-20 LAB — TROPONIN I (HIGH SENSITIVITY)
Troponin I (High Sensitivity): 19 ng/L — ABNORMAL HIGH (ref <18)
Troponin I (High Sensitivity): 21 ng/L — ABNORMAL HIGH (ref <18)

## 2019-12-20 LAB — SARS CORONAVIRUS 2 BY RT PCR (HOSPITAL ORDER, PERFORMED IN ~~LOC~~ HOSPITAL LAB): SARS Coronavirus 2: NEGATIVE

## 2019-12-20 LAB — BRAIN NATRIURETIC PEPTIDE: B Natriuretic Peptide: 2059 pg/mL — ABNORMAL HIGH (ref 0.0–100.0)

## 2019-12-20 MED ORDER — FUROSEMIDE 10 MG/ML IJ SOLN
40.0000 mg | Freq: Once | INTRAMUSCULAR | Status: AC
  Administered 2019-12-20: 40 mg via INTRAVENOUS
  Filled 2019-12-20: qty 4

## 2019-12-20 NOTE — ED Notes (Signed)
Date and time results received: 12/20/19 2218 (use smartphrase ".now" to insert current time)  Test: INR  Critical Value: 4.7  Name of Provider Notified: Dr. Alvino Chapel  Orders Received? Or Actions Taken?: na

## 2019-12-20 NOTE — ED Triage Notes (Addendum)
Per family pt is having CHF flair up and says he has been having low energy and saturations in the 80s starting today.  Also states he has been voiding blood

## 2019-12-20 NOTE — ED Notes (Signed)
Male purewick placed on pt.  Pt is incont of urine and blood noted in depends.  Pt's son states pt is on blood thinners.

## 2019-12-20 NOTE — H&P (Signed)
History and Physical  Scott [name] EPP:[id number] DOB: 12-29-1921 DOA: 12/20/2019  Referring physician: Davonna Belling, MD PCP: Dettinger, Fransisca Kaufmann, MD  Patient coming from: Home  Chief Complaint: Increasing shortness of breath and fatigue  HPI: Scott [name] is a 86 y.o. male with medical history significant for A. 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  Patient was also noted to have some blood in the urine since yesterday.  O2 sat at home was reported to be in the 80s, this was 77 on arrival to the ED.  Patient also have nonproductive cough and a complaint of mild increase swelling in knees right leg.  Patient has had Covid vaccine.  He denies fever, headache, chest pain, nausea, vomiting or abdominal pain.  ED Course: In the emergency department, he was initially tachypneic.  Work-up in the ED showed normocytic anemia, troponin 19>21, proBNP 2059 (this has ranged within 1507 (5 months ago)> 1841 (3 months ago).  INR was 5.1>4.7.  SARS coronavirus 2 was negative.  Chest x-ray showed chronic cardiomegaly with minimal subsegmental bibasilar atelectasis.  IV Lasix 40 Mg x1 was given.  Hospitalist was asked to admit patient for further evaluation and management.   Review of Systems: Constitutional: Negative for chills and fever.  HENT: Negative for ear pain and sore throat.   Eyes: Negative for pain and visual disturbance.  Respiratory: Positive for  dry cough and shortness of breath.   Cardiovascular: Negative for chest pain and palpitations.  Gastrointestinal: Negative for abdominal pain and vomiting.  Endocrine: Negative for polyphagia and polyuria.  Genitourinary: Negative for decreased urine volume,  dysuria Musculoskeletal: Negative for arthralgias and back pain.  Skin: Negative for color change and rash.  Allergic/Immunologic: Negative for immunocompromised state.  Neurological: Negative for tremors, syncope, speech difficulty, weakness, light-headedness and headaches.  Hematological: Does not bruise/bleed easily.  All other systems reviewed and are negative   Past Medical History:  Diagnosis Date  . Arthritis    "all over"  . Atrial fibrillation ([place])   . Benign localized prostatic hyperplasia with lower urinary tract symptoms (LUTS)   . Bradycardia 11/28/2015   Severe-HR in the 20s to 30s following intubation for urological procedure.  Marland Kitchen CAD- non obstructive disease by cath 3/14 cardiologist-  dr hochrein  . Chronic lower back pain   . COPD (chronic obstructive pulmonary disease) ([place])   . DDD (degenerative disc disease)   . Depression   . Dyspnea    w/ exertion and lying down (raise head)  . ETOH abuse   . GERD (gastroesophageal reflux disease)   . Glaucoma   . Glaucoma, both eyes   . History of bladder cancer urologist-- dr Jeffie Pollock   first dx 2012--  s/p TURBT's  . History of DVT of lower extremity 03/2013   bilateral   . History of pulmonary embolus (PE) 04/2013  . HTN (hypertension)   . Hyperlipidemia   . Hypertension   . LBBB (left bundle branch block)    chronic  . Non-ischemic cardiomyopathy ([place])    last echo 01-18-2017, ef 35-40%  . NSVT (nonsustained ventricular tachycardia) ([place])    a. NSVT 06/2012; NSVT also seen during 03/2013 adm. b. Med rx. Not candidate for ICD given  adv age.  Marland Kitchen PAD (peripheral artery disease) ([place])   . 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 PVCs (premature ventricular contractions)   . PVD (peripheral vascular disease) (HCC)    Right ABI .75, Left .78 (2006)  . Syncope 06/2012   a. Felt to be postural syncope related to diuretics 06/2012.   Marland Kitchen Systolic and diastolic CHF, chronic (HCC) cardiologsit-  dr hochrein   a. NICM - patent cors 06/2012, EF 40% at that time. b. 03/2013 eval: EF 20-25%.  . Vision loss, left eye    due to glaucoma  . Wears glasses    Past Surgical History:  Procedure Laterality Date  . AMPUTATION Left 10/16/2014   Procedure: AMPUTATION ABOVE KNEE- LEFT;  Surgeon: Mal Misty, MD;  Location: [place];  Service: Vascular;  Laterality: Left;  . CARDIAC CATHETERIZATION  05-11-2004  [facility]   MINIMAL CORANARY PLAQUE/ NORMAL LVF/ EF 55%/  NON-OBSTRUCTIVE LAD 25%  . CARDIAC CATHETERIZATION  06/18/2012   dr hochrein   mild luminal irregularities of coronaries, ef 45-50%  . CARDIOVASCULAR STRESS TEST  10-15-2010   LOW RISK NUCLEAR STUDY/ NO EVIDENCE OF ISCHEMIA/ NORMAL EF  . CATARACT EXTRACTION W/ INTRAOCULAR LENS  IMPLANT, BILATERAL Bilateral   . CYSTOSCOPY  10/07/2011   Procedure: CYSTOSCOPY;  Surgeon: Malka So, MD;  Location: [facility];  Service: Urology;  Laterality: N/A;  . CYSTOSCOPY N/A 10/20/2018   Procedure: CYSTOSCOPY WITH FULGURATION OF PROSTATIC URETHRAL TUMOR;  Surgeon: [name], MD;  Location: [facility];  Service: Urology;  Laterality: N/A;  . CYSTOSCOPY N/A 10/11/2019   Procedure: CYSTOSCOPY;  Surgeon: Cleon Gustin, MD;  Location: [facility];  Service: Urology;  Laterality: N/A;  . CYSTOSCOPY WITH BIOPSY  03/14/2012   Procedure: CYSTOSCOPY WITH BIOPSY;  Surgeon: Malka So, MD;  Location: [facility];  Service: Urology;  Laterality: N/A;  WITH FULGURATION  . CYSTOSCOPY WITH BIOPSY N/A 12/09/2016   Procedure: CYSTOSCOPY WITH BIOPSY AND FULGURATION;  Surgeon: [name], MD;  Location: [facility];  Service: Urology;  Laterality: N/A;  . CYSTOSCOPY WITH BIOPSY N/A 12/20/2017   Procedure: CYSTOSCOPY WITH BIOPSY WITH FULGURATION;  Surgeon: [name], MD;  Location: [facility];  Service: Urology;  Laterality: N/A;  . CYSTOSCOPY WITH BIOPSY N/A 06/09/2018   Procedure: CYSTOSCOPY WITH BIOPSY  AND FULGURATION;  Surgeon: [name], MD;  Location: [facility];  Service: Urology;  Laterality: N/A;  . CYSTOSCOPY WITH FULGERATION N/A 01/20/2016   Procedure: CYSTOSCOPY, BIOPSY WITH FULGERATION OF URTHRAL TUMOR;  Surgeon: [name], MD;  Location: [facility];  Service: Urology;  Laterality: N/A;  . CYSTOSCOPY WITH FULGERATION N/A 06/01/2016   Procedure: CYSTOSCOPY WITH FULGERATION urethral tumors and bladder neck;  Surgeon: [name], MD;  Location: [facility];  Service: Urology;  Laterality: N/A;  . SHOULDER SURGERY Left 1970's  . TONSILLECTOMY    . TRANSTHORACIC ECHOCARDIOGRAM  01-18-2017   dr hochrein   moderate LVH, ef 35-40%, diffuse hypokinesis/  trivial AR and MR/  mild TR  . TRANSURETHRAL RESECTION OF BLADDER TUMOR  10/07/2011   Procedure: TRANSURETHRAL RESECTION OF BLADDER TUMOR (TURBT);  Surgeon: Malka So, MD;  Location[facility];  Service: Urology;  Laterality: N/A;  . TRANSURETHRAL RESECTION OF BLADDER TUMOR N/A 07/10/2015   Procedure: TRANSURETHRAL RESECTION OF BLADDER TUMOR (TURBT), CYSTOSCOPY ;  Surgeon: [name], MD;  Location: [facility];  Service: Urology;  Laterality: N/A;  . TRANSURETHRAL RESECTION OF BLADDER TUMOR N/A 10/11/2019   Procedure: TRANSURETHRAL RESECTION OF PROSTATIC URETHRAL TUMOR;  Surgeon: Cleon Gustin, MD;  Location: [facility];  Service: Urology;  Laterality: N/A;  . TRANSURETHRAL RESECTION OF PROSTATE  03/14/2012   Procedure: TRANSURETHRAL RESECTION OF THE PROSTATE WITH GYRUS INSTRUMENTS;  Surgeon: Malka So, MD;  Location: [facility];  Service: Urology;  Laterality: N/A;    Social History:  reports that he quit smoking about 44 years ago. His smoking use included cigarettes. He started smoking about 77 years ago. He has a 35.00 pack-year smoking history. He has never used smokeless tobacco. He reports previous alcohol use of about 7.0 standard drinks of alcohol per week. He reports that he does not use drugs.   Allergies  Allergen Reactions  .  Rocephin [Ceftriaxone Sodium In Dextrose] Other (See Comments)    Recurrent seizures  . Lipitor [Atorvastatin] Other (See Comments)    Unknown rxn per pt    Family History  Problem Relation Age of Onset  . Hypertension Mother   . Arthritis Mother   . Lung cancer Sister   . Deep vein thrombosis Father   . Early death Brother      Prior to Admission medications   Medication Sig Start Date End Date Taking? Authorizing Provider  acetaminophen (TYLENOL) 325 MG tablet Take 2 tablets (650 mg total) by mouth every 6 (six) hours as needed for mild pain (or Fever >/= 101). 12/26/18   Elgergawy, Silver Huguenin, MD  albuterol (PROVENTIL) (2.5 MG/3ML) 0.083% nebulizer solution Take 3 mLs (2.5 mg total) by nebulization every 6 (six) hours as needed for wheezing or shortness of breath. Patient taking differently: Take 2.5 mg by nebulization daily. After bath 07/09/19   Roxan Hockey, MD  albuterol (VENTOLIN HFA) 108 (90 Base) MCG/ACT inhaler Inhale 2 puffs into the lungs daily. After bath    [provider]  Albuterol Sulfate (PROAIR HFA IN) Inhale 2 puffs into the lungs daily as needed (Shortness of breath).    [provider]  carvedilol (COREG) 6.25 MG tablet TAKE  (1)  TABLET TWICE A DAY. 11/27/19   Dettinger, Fransisca Kaufmann, MD  donepezil (ARICEPT) 10 MG tablet TAKE 1 TABLET AT BEDTIME 11/27/19   Dettinger, Fransisca Kaufmann, MD  febuxostat (ULORIC) 40 MG tablet TAKE 1 TABLET DAILY 12/13/19   Dettinger, Fransisca Kaufmann, MD  fluticasone (FLONASE) 50 MCG/ACT nasal spray SPRAY 1 SPRAY IN EACH NOSTRIL ONCE DAILY. Patient taking differently: Place 1 spray into both nostrils daily as needed for allergies or rhinitis.  08/29/19   Dettinger, Fransisca Kaufmann, MD  furosemide (LASIX) 20 MG tablet TAKE (1) TABLET DAILY IN THE MORNING. 11/13/19   Dettinger, Fransisca Kaufmann, MD  furosemide (LASIX) 20 MG tablet Take 1 tablet (20 mg total) by mouth daily for 2 days. Take an extra 20 mg with his current 20 mg for 3 days 12/20/19 12/22/19   Dettinger, Fransisca Kaufmann, MD  gabapentin (NEURONTIN) 400 MG capsule Take 1 capsule (400 mg total) by mouth 2 (two) times daily. Patient taking differently: Take 400 mg by mouth at bedtime.  07/09/19   Roxan Hockey, MD  Ipratropium-Albuterol (COMBIVENT RESPIMAT) 20-100 MCG/ACT AERS respimat Inhale 1 puff into the lungs every 6 (six) hours as needed for wheezing or shortness of breath (As needed for wheezing , cough or shortness of breath). Patient taking differently: Inhale 1 puff into the lungs daily.  07/09/19   Roxan Hockey, MD  isosorbide-hydrALAZINE (BIDIL) 20-37.5 MG tablet Take 1 tablet by mouth 2 (two) times daily. 12/13/19   Dettinger, Fransisca Kaufmann, MD  MYRBETRIQ 25 MG TB24 tablet Take 1 tablet (25 mg total) by mouth in the morning and at bedtime. 11/05/19   McKenzie, Candee Furbish, MD  Nutritional Supplements (ENSURE NUTRITION SHAKE) LIQD Take 1 Bottle by mouth 4 (four) times daily. 1 bottle of chocolate flavored Ensure up to 4 times a day--give with ice-please Patient taking differently: Take 1 Bottle by mouth daily as needed (When he does not eat well).  07/09/19   Roxan Hockey, MD  pantoprazole (PROTONIX) 40 MG tablet TAKE 1 TABLET DAILY 12/06/19   Dettinger, Fransisca Kaufmann, MD  PARoxetine (PAXIL) 40 MG tablet Take 40 mg by mouth daily.    [provider]  sertraline (ZOLOFT) 50 MG tablet TAKE (1) TABLET DAILY IN THE MORNING. 12/12/19   Dettinger, Fransisca Kaufmann, MD  simvastatin (ZOCOR) 10 MG tablet TAKE 1 TABLET ONCE DAILY IN THE EVENING Patient taking differently: Take 10 mg by mouth daily.  08/13/19   Dettinger, Fransisca Kaufmann, MD  warfarin (COUMADIN) 2.5 MG tablet Take 1 tablet (2.5 mg total) by mouth daily. Take 2.5 mg on Monday and Friday and Saturday 12/19/19   Dettinger, Fransisca Kaufmann, MD  warfarin (COUMADIN) 5 MG tablet Take 1 tablet (5 mg total) by mouth daily. Take 5 mg on Sunday Tuesday Wednesday Thursday 12/19/19   Dettinger, Fransisca Kaufmann, MD    Physical Exam: BP (!) 146/63   Pulse (!) 59   Temp 99.7 F  (37.6 C)   Resp 19   Ht 5\' 6"  (1.676 m)   Wt 68.9 kg   SpO2 99%   BMI 24.52 kg/m   . General: 86 y.o. year-old male well developed well nourished in no acute distress.  Alert and oriented x3. Marland Kitchen HEENT: NCAT, EOMI . Neck: Supple, trachea midline . Cardiovascular: Regular rate and rhythm with no rubs or gallops. JVD noted.   2/4 pulses in all 4 extremities. Marland Kitchen Respiratory:Bilateral crackles noted in lower lobes (L > R).    . Abdomen: Soft nontender nondistended with normal bowel sounds x4 quadrants. . Muskuloskeletal: Left AKA, RLE with +2 edema.   . Neuro: CN II-XII intact, strength, sensation, reflexes . Skin: No ulcerative lesions noted or rashes . Psychiatry: Judgement and insight appear normal. Mood is appropriate for condition and setting          Labs on Admission:  Basic Metabolic Panel: Recent Labs  Lab 12/19/19 1028 12/20/19 2058  NA 146* 142  K 3.8 4.3  CL 108* 109  CO2 24 25  GLUCOSE 140* 109*  BUN 24 22  CREATININE 1.25 1.31*  CALCIUM 9.8 9.0   Liver Function Tests: Recent Labs  Lab 12/19/19 1028 12/20/19 2058  AST 16 20  ALT 10 15  ALKPHOS 166* 126  BILITOT 0.5 0.8  PROT 6.4 7.2  ALBUMIN 4.1 3.9   No results for input(s): LIPASE, AMYLASE in the last 168 hours. No results for input(s): AMMONIA in the last 168 hours. CBC: Recent Labs  Lab 12/19/19 1028 12/20/19 2030  WBC 3.5 4.7  NEUTROABS 2.1 3.3  HGB 11.8* 11.5*  HCT 37.2* 38.3*  MCV 82 88.5  PLT 187 178   Cardiac Enzymes: No results for input(s): CKTOTAL, CKMB, CKMBINDEX, TROPONINI in the last 168 hours.  BNP (last 3 results) Recent Labs    07/02/19 1247 08/30/19 2221 12/20/19 2030  BNP 1,193.0* 841.0* 2,059.0*  ProBNP (last 3 results) No results for input(s): PROBNP in the last 8760 hours.  CBG: No results for input(s): GLUCAP in the last 168 hours.  Radiological Exams on Admission: DG Chest Portable 1 View  Result Date: 12/20/2019 CLINICAL DATA:  Shortness of breath,  weakness, cough. EXAM: PORTABLE CHEST 1 VIEW COMPARISON:  Radiograph 08/30/2019.  CT 07/04/2018 FINDINGS: Chronic cardiomegaly. Unchanged mediastinal contours. Aortic atherosclerosis. Minimal subsegmental opacities at the lung bases. No confluent consolidation. No pneumothorax or pleural effusion. No pulmonary edema. No acute osseous abnormalities are seen. IMPRESSION: Chronic cardiomegaly. Minimal subsegmental bibasilar atelectasis. Electronically Signed   By: Keith Rake M.D.   On: 12/20/2019 20:56    EKG: I independently viewed the EKG done and my findings are as followed: Sinus rhythm at a rate of 80 bpm with multiform PVCs and LBBB  Assessment/Plan Present on Admission: . Chronic combined systolic and diastolic heart failure ([place]) . Essential hypertension  Principal Problem:   Acute exacerbation of CHF (congestive heart failure) (HCC) Active Problems:   Chronic combined systolic and diastolic heart failure (HCC)   Essential hypertension   Acute respiratory failure with hypoxia (HCC)   Hematuria   Supratherapeutic INR   Acute respiratory failure with hypoxia possibly secondary to exacerbation of CHF Patient was hypoxic at home with O2 sat in the 80s, O2 sat on arrival at the ED was 77%, supplemental oxygen via [place] at 2 LPM was provided with improvement in O2 sat into the 90s. BNP 2059,(this has ranged within 1507 (5 months ago)> 1841 (3 months ago). Continue telemetry Lasix 40 Mg x1 was given in the ED, continue Lasix 40 Mg twice daily Continue total input/output, daily weights and fluid restriction Continue home Coreg Continue Cardiac diet  Echocardiogram done on 07/02/2019 showed LVEF of 35 to 40% with a left ventricle showing moderately decreased function and regional wall motion abnormalities.  Right ventricular systolic function is also moderately reduced.  Echocardiogram will be repeated in the morning   Elevated troponin possible secondary to type II demand  ischemia Troponin 19> 21; patient denies any chest pain EKG showed sinus rhythm at rate of 80 bpm with multiform PVCs Continue to trend troponin level  Hematuria History of recurrent prostatic urethra tumor s/p TURBT Patient presents with hematuria.  He has a history of recurrent prostatic urethral tumor s/p TURBT in July of this year This may be due to supratherapeutic INR, but due to above history, it would be reasonable to consult with urology for further recommendation.  Supratherapeutic INR INR 5.1> 4.7 Patient presents with hematuria as described above Warfarin has been held at this time Continue to monitor INR.  History of A. fib/DVT on warfarin EKG currently in sinus rhythm Warfarin temporarily held at this time  COPD Continue home albuterol, Combivent  GERD Continue home Protonix   DVT prophylaxis: Warfarin temporarily held due to supratherapeutic INR and hematuria.  SCD held due to history of RLE DVT (per son at bedside)  Code Status: DNR  Family Communication: Son at bedside (all questions answered to satisfaction)  Disposition Plan:  Patient is from:                        home Anticipated DC to:                   SNF or family members home Anticipated DC date:               2-3 days  Anticipated DC barriers:          Unstable to discharge at this time due to hypoxia possibly secondary to CHF exacerbation requiring supplemental oxygen and supratherapeutic INR and hematuria that requires further work-up   Consults called: Urology  Admission status: Inpatient    Bernadette Hoit MD [facility]  If 7PM-7AM, please contact night-coverage [email or web address] Password [email or web address]  12/21/2019, 1:47 AM

## 2019-12-21 ENCOUNTER — Inpatient Hospital Stay (HOSPITAL_COMMUNITY): Payer: Medicare Other

## 2019-12-21 DIAGNOSIS — R791 Abnormal coagulation profile: Secondary | ICD-10-CM

## 2019-12-21 DIAGNOSIS — R319 Hematuria, unspecified: Secondary | ICD-10-CM

## 2019-12-21 DIAGNOSIS — I5021 Acute systolic (congestive) heart failure: Secondary | ICD-10-CM

## 2019-12-21 LAB — CBC
HCT: 37.9 % — ABNORMAL LOW (ref 39.0–52.0)
Hemoglobin: 11.4 g/dL — ABNORMAL LOW (ref 13.0–17.0)
MCH: 26.3 pg (ref 26.0–34.0)
MCHC: 30.1 g/dL (ref 30.0–36.0)
MCV: 87.3 fL (ref 80.0–100.0)
Platelets: 169 10*3/uL (ref 150–400)
RBC: 4.34 MIL/uL (ref 4.22–5.81)
RDW: 16 % — ABNORMAL HIGH (ref 11.5–15.5)
WBC: 4.6 10*3/uL (ref 4.0–10.5)
nRBC: 0 % (ref 0.0–0.2)

## 2019-12-21 LAB — COMPREHENSIVE METABOLIC PANEL
ALT: 14 U/L (ref 0–44)
AST: 16 U/L (ref 15–41)
Albumin: 3.7 g/dL (ref 3.5–5.0)
Alkaline Phosphatase: 124 U/L (ref 38–126)
Anion gap: 11 (ref 5–15)
BUN: 22 mg/dL (ref 8–23)
CO2: 28 mmol/L (ref 22–32)
Calcium: 9.1 mg/dL (ref 8.9–10.3)
Chloride: 106 mmol/L (ref 98–111)
Creatinine, Ser: 1.34 mg/dL — ABNORMAL HIGH (ref 0.61–1.24)
GFR calc Af Amer: 51 mL/min — ABNORMAL LOW (ref >60)
GFR calc non Af Amer: 44 mL/min — ABNORMAL LOW (ref >60)
Glucose, Bld: 95 mg/dL (ref 70–99)
Potassium: 3.7 mmol/L (ref 3.5–5.1)
Sodium: 145 mmol/L (ref 135–145)
Total Bilirubin: 1.1 mg/dL (ref 0.3–1.2)
Total Protein: 6.6 g/dL (ref 6.5–8.1)

## 2019-12-21 LAB — PROTIME-INR
INR: 4.5 (ref 0.8–1.2)
Prothrombin Time: 41.1 seconds — ABNORMAL HIGH (ref 11.4–15.2)

## 2019-12-21 LAB — ECHOCARDIOGRAM COMPLETE
Area-P 1/2: 2.6 cm2
Height: 66 in
S' Lateral: 3.8 cm
Weight: 2430.35 oz

## 2019-12-21 LAB — PHOSPHORUS: Phosphorus: 3.7 mg/dL (ref 2.5–4.6)

## 2019-12-21 LAB — URINALYSIS, ROUTINE W REFLEX MICROSCOPIC
Bacteria, UA: NONE SEEN
Bilirubin Urine: NEGATIVE
Glucose, UA: NEGATIVE mg/dL
Ketones, ur: NEGATIVE mg/dL
Leukocytes,Ua: NEGATIVE
Nitrite: NEGATIVE
Protein, ur: 100 mg/dL — AB
RBC / HPF: >50 RBC/hpf — ABNORMAL HIGH (ref 0–5)
Specific Gravity, Urine: 1.009 (ref 1.005–1.030)
WBC, UA: >50 WBC/hpf — ABNORMAL HIGH (ref 0–5)
pH: 6 (ref 5.0–8.0)

## 2019-12-21 LAB — MAGNESIUM: Magnesium: 2.1 mg/dL (ref 1.7–2.4)

## 2019-12-21 LAB — TROPONIN I (HIGH SENSITIVITY)
Troponin I (High Sensitivity): 20 ng/L — ABNORMAL HIGH (ref <18)
Troponin I (High Sensitivity): 22 ng/L — ABNORMAL HIGH (ref <18)

## 2019-12-21 MED ORDER — DONEPEZIL HCL 5 MG PO TABS
10.0000 mg | ORAL_TABLET | Freq: Every day | ORAL | Status: DC
  Administered 2019-12-21 – 2019-12-22 (×2): 10 mg via ORAL
  Filled 2019-12-21 (×2): qty 2
  Filled 2019-12-21 (×3): qty 1

## 2019-12-21 MED ORDER — IPRATROPIUM-ALBUTEROL 20-100 MCG/ACT IN AERS: 1.0000 | INHALATION_SPRAY | Freq: Every day | RESPIRATORY_TRACT | Status: DC

## 2019-12-21 MED ORDER — FLUTICASONE PROPIONATE 50 MCG/ACT NA SUSP
1.0000 | Freq: Every day | NASAL | Status: DC | PRN
  Filled 2019-12-21: qty 16

## 2019-12-21 MED ORDER — ALPRAZOLAM 0.25 MG PO TABS
0.2500 mg | ORAL_TABLET | Freq: Once | ORAL | Status: AC
  Administered 2019-12-21: 0.25 mg via ORAL
  Filled 2019-12-21: qty 1

## 2019-12-21 MED ORDER — SIMVASTATIN 20 MG PO TABS
10.0000 mg | ORAL_TABLET | Freq: Every day | ORAL | Status: DC
  Administered 2019-12-21 – 2019-12-23 (×3): 10 mg via ORAL
  Filled 2019-12-21 (×3): qty 1

## 2019-12-21 MED ORDER — PANTOPRAZOLE SODIUM 40 MG PO TBEC
40.0000 mg | DELAYED_RELEASE_TABLET | Freq: Every day | ORAL | Status: DC
  Administered 2019-12-21 – 2019-12-23 (×3): 40 mg via ORAL
  Filled 2019-12-21 (×3): qty 1

## 2019-12-21 MED ORDER — CARVEDILOL 3.125 MG PO TABS
6.2500 mg | ORAL_TABLET | Freq: Two times a day (BID) | ORAL | Status: DC
  Administered 2019-12-21 – 2019-12-23 (×5): 6.25 mg via ORAL
  Filled 2019-12-21 (×5): qty 2

## 2019-12-21 MED ORDER — ALBUTEROL SULFATE (2.5 MG/3ML) 0.083% IN NEBU: 3.0000 mL | INHALATION_SOLUTION | Freq: Every day | RESPIRATORY_TRACT | Status: DC | PRN

## 2019-12-21 MED ORDER — MIRABEGRON ER 25 MG PO TB24
25.0000 mg | ORAL_TABLET | Freq: Every day | ORAL | Status: DC
  Administered 2019-12-21 – 2019-12-23 (×3): 25 mg via ORAL
  Filled 2019-12-21 (×6): qty 1

## 2019-12-21 MED ORDER — FEBUXOSTAT 40 MG PO TABS
40.0000 mg | ORAL_TABLET | Freq: Every day | ORAL | Status: DC
  Administered 2019-12-21 – 2019-12-23 (×3): 40 mg via ORAL
  Filled 2019-12-21 (×6): qty 1

## 2019-12-21 MED ORDER — ISOSORB DINITRATE-HYDRALAZINE 20-37.5 MG PO TABS
1.0000 | ORAL_TABLET | Freq: Two times a day (BID) | ORAL | Status: DC
Start: 2019-12-21 — End: 2019-12-21

## 2019-12-21 MED ORDER — ENSURE ENLIVE PO LIQD
1.0000 | Freq: Every day | ORAL | Status: DC | PRN
  Filled 2019-12-21: qty 237

## 2019-12-21 MED ORDER — ALBUTEROL SULFATE (2.5 MG/3ML) 0.083% IN NEBU
2.5000 mg | INHALATION_SOLUTION | Freq: Every day | RESPIRATORY_TRACT | Status: DC
  Administered 2019-12-21 – 2019-12-22 (×2): 2.5 mg via RESPIRATORY_TRACT
  Filled 2019-12-21 (×2): qty 3

## 2019-12-21 MED ORDER — FUROSEMIDE 10 MG/ML IJ SOLN
40.0000 mg | Freq: Two times a day (BID) | INTRAMUSCULAR | Status: DC
  Administered 2019-12-21: 40 mg via INTRAVENOUS
  Filled 2019-12-21: qty 4

## 2019-12-21 NOTE — ED Notes (Signed)
Date and time results received: 12/21/19 0421  Test: INR Critical Value: 4.5  Name of Provider Notified: Dr. Josephine Cables  Orders Received? Or Actions Taken?: Actions Taken: n/a

## 2019-12-21 NOTE — ED Notes (Signed)
Attempted report x1. 

## 2019-12-21 NOTE — ED Notes (Signed)
ED TO INPATIENT HANDOFF REPORT  ED Nurse Name and Phone #:   S Name/Age/Gender Scott [name] 86 y.o. male Room/Bed: APA09/APA09  Code Status   Code Status: DNR  Home/SNF/Other Home Patient oriented to: self, place, time and situation Is this baseline? Yes   Triage Complete: Triage complete  Chief Complaint Acute exacerbation of CHF (congestive heart failure) ([place]) [I50.9]  Triage Note Per family pt is having CHF flair up and says he has been having low energy and saturations in the 80s starting today.  Also states he has been voiding blood     Allergies Allergies  Allergen Reactions  . Rocephin [Ceftriaxone Sodium In Dextrose] Other (See Comments)    Recurrent seizures  . Lipitor [Atorvastatin] Other (See Comments)    Unknown rxn per pt    Level of Care/Admitting Diagnosis ED Disposition    ED Disposition Condition [place]: [facility] [[id number]]  Level of Care: Telemetry [5]  Covid Evaluation: Confirmed COVID Negative  Diagnosis: Acute exacerbation of CHF (congestive heart failure) [facility]) [[id number]]  Admitting Physician: Bernadette Hoit [[id number]]  Attending Physician: Bernadette Hoit [[id number]]  Estimated length of stay: past midnight tomorrow  Certification:: I certify this patient will need inpatient services for at least 2 midnights       B Medical/Surgery History Past Medical History:  Diagnosis Date  . Arthritis    "all over"  . Atrial fibrillation ([place])   . Benign localized prostatic hyperplasia with lower urinary tract symptoms (LUTS)   . Bradycardia 11/28/2015   Severe-HR in the 20s to 30s following intubation for urological procedure.  Marland Kitchen CAD- non obstructive disease by cath 3/14 cardiologist-  dr hochrein  . Chronic lower back pain   . COPD (chronic obstructive pulmonary disease) ([place])   . DDD (degenerative disc disease)   . Depression   . Dyspnea    w/ exertion and lying down (raise head)  . ETOH abuse   . GERD  (gastroesophageal reflux disease)   . Glaucoma   . Glaucoma, both eyes   . History of bladder cancer urologist-- dr Jeffie Pollock   first dx 2012--  s/p TURBT's  . History of DVT of lower extremity 03/2013   bilateral   . History of pulmonary embolus (PE) 04/2013  . HTN (hypertension)   . Hyperlipidemia   . Hypertension   . LBBB (left bundle branch block)    chronic  . Non-ischemic cardiomyopathy ([place])    last echo 01-18-2017, ef 35-40%  . NSVT (nonsustained ventricular tachycardia) ([place])    a. NSVT 06/2012; NSVT also seen during 03/2013 adm. b. Med rx. Not candidate for ICD given adv age.  Marland Kitchen PAD (peripheral artery disease) ([place])   . 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 PVCs (premature ventricular contractions)   . PVD (peripheral vascular disease) ([facility])    Right ABI .75, Left .78 (2006)  . Syncope 06/2012   a. Felt to be postural syncope related to diuretics 06/2012.  Marland Kitchen Systolic and diastolic CHF, chronic (HCC) cardiologsit-  dr hochrein   a. NICM - patent cors 06/2012, EF 40% at that time. b. 03/2013 eval: EF 20-25%.  . Vision loss, left eye    due to glaucoma  . Wears glasses    Past Surgical History:  Procedure Laterality Date  . AMPUTATION Left 10/16/2014   Procedure: AMPUTATION ABOVE KNEE- LEFT;  Surgeon: [name]  Rockwell Alexandria, MD;  Location: [place];  Service: Vascular;  Laterality: Left;  . CARDIAC CATHETERIZATION  05-11-2004  DR [facility]   MINIMAL CORANARY PLAQUE/ NORMAL LVF/ EF 55%/  NON-OBSTRUCTIVE LAD 25%  . CARDIAC CATHETERIZATION  06/18/2012   dr hochrein   mild luminal irregularities of coronaries, ef 45-50%  . CARDIOVASCULAR STRESS TEST  10-15-2010   LOW RISK NUCLEAR STUDY/ NO EVIDENCE OF ISCHEMIA/ NORMAL EF  . CATARACT EXTRACTION W/ INTRAOCULAR LENS  IMPLANT, BILATERAL Bilateral   . CYSTOSCOPY  10/07/2011   Procedure: CYSTOSCOPY;  Surgeon: Malka So, MD;  Location: [facility];  Service: Urology;  Laterality: N/A;  . CYSTOSCOPY N/A 10/20/2018   Procedure: CYSTOSCOPY WITH FULGURATION OF PROSTATIC URETHRAL TUMOR;  Surgeon: [name], MD;  Location: [facility];  Service: Urology;  Laterality: N/A;  . CYSTOSCOPY N/A 10/11/2019   Procedure: CYSTOSCOPY;  Surgeon: Cleon Gustin, MD;  Location: [facility];  Service: Urology;  Laterality: N/A;  . CYSTOSCOPY WITH BIOPSY  03/14/2012   Procedure: CYSTOSCOPY WITH BIOPSY;  Surgeon: Malka So, MD;  Location[facility];  Service: Urology;  Laterality: N/A;  WITH FULGURATION  . CYSTOSCOPY WITH BIOPSY N/A 12/09/2016   Procedure: CYSTOSCOPY WITH BIOPSY AND FULGURATION;  Surgeon: [name], MD;  Location[facility];  Service: Urology;  Laterality: N/A;  . CYSTOSCOPY WITH BIOPSY N/A 12/20/2017   Procedure: CYSTOSCOPY WITH BIOPSY WITH FULGURATION;  Surgeon: [name], MD;  [facility];  Service: Urology;  Laterality: N/A;  . CYSTOSCOPY WITH BIOPSY N/A 06/09/2018   Procedure: CYSTOSCOPY WITH BIOPSY AND FULGURATION;  Surgeon: [name], MD;  Location[facility];  Service: Urology;  Laterality: N/A;  . CYSTOSCOPY WITH FULGERATION N/A 01/20/2016   Procedure: CYSTOSCOPY, BIOPSY WITH FULGERATION OF URTHRAL TUMOR;  Surgeon: [name], MD;  [facility];  Service: Urology;  Laterality: N/A;  . CYSTOSCOPY WITH FULGERATION N/A 06/01/2016   Procedure: CYSTOSCOPY WITH FULGERATION urethral tumors and bladder neck;  Surgeon: [name], MD;  [facility];  Service: Urology;  Laterality: N/A;  . SHOULDER SURGERY Left 1970's  . TONSILLECTOMY    . TRANSTHORACIC ECHOCARDIOGRAM  01-18-2017   dr hochrein   moderate LVH, ef 35-40%, diffuse hypokinesis/  trivial AR and MR/  mild TR  . TRANSURETHRAL RESECTION OF BLADDER TUMOR  10/07/2011   Procedure: TRANSURETHRAL RESECTION OF BLADDER TUMOR (TURBT);  Surgeon: Malka So, MD;  Location: [facility];  Service: Urology;  Laterality: N/A;  . TRANSURETHRAL RESECTION OF BLADDER  TUMOR N/A 07/10/2015   Procedure: TRANSURETHRAL RESECTION OF BLADDER TUMOR (TURBT), CYSTOSCOPY ;  Surgeon: [name], MD;  [facility];  Service: Urology;  Laterality: N/A;  . TRANSURETHRAL RESECTION OF BLADDER TUMOR N/A 10/11/2019   Procedure: TRANSURETHRAL RESECTION OF PROSTATIC URETHRAL TUMOR;  Surgeon: Cleon Gustin, MD;  Location[facility];  Service: Urology;  Laterality: N/A;  . TRANSURETHRAL RESECTION OF PROSTATE  03/14/2012   Procedure: TRANSURETHRAL RESECTION OF THE PROSTATE WITH GYRUS INSTRUMENTS;  Surgeon: Malka So, MD;  [facility];  Service: Urology;  Laterality: N/A;     A IV Location/Drains/Wounds Patient Lines/Drains/Airways Status    Active Line/Drains/Airways    Name Placement date Placement time Site Days   Peripheral IV 12/20/19 Right;Lateral Forearm 12/20/19  2029  Forearm  1   Incision (Closed) 10/11/19 Perineum 10/11/19  1416   71   Wound / Incision (Open or Dehisced) 12/10/18 Non-pressure wound Leg Right 12/10/18  1828  Leg  376          Intake/Output Last 24 hours  Intake/Output Summary (Last 24 hours) at 12/21/2019 1617 Last data filed at 12/21/2019 5188 Gross per 24 hour  Intake --  Output 700 ml  Net -700 ml    Labs/Imaging Results for orders placed or performed during the hospital encounter of 12/20/19 (from the past 48 hour(s))  SARS Coronavirus 2 by RT PCR (hospital order, performed in [facility] hospital lab) Nasopharyngeal Nasopharyngeal Swab     Status: None   Collection Time: 12/20/19  8:16 PM   Specimen: Nasopharyngeal Swab  Result Value Ref Range   SARS Coronavirus 2 NEGATIVE NEGATIVE    Comment: (NOTE) SARS-CoV-2 target nucleic acids are NOT DETECTED.  The SARS-CoV-2 RNA is generally detectable in upper and lower respiratory specimens during the acute phase of infection. The lowest concentration of SARS-CoV-2 viral copies this assay can detect is 250 copies / mL. A negative result does not preclude SARS-CoV-2  infection and should not be used as the sole basis for treatment or other patient management decisions.  A negative result may occur with improper specimen collection / handling, submission of specimen other than nasopharyngeal swab, presence of viral mutation(s) within the areas targeted by this assay, and inadequate number of viral copies (<250 copies / mL). A negative result must be combined with clinical observations, patient history, and epidemiological information.  Fact Sheet for Patients:   StrictlyIdeas.no  Fact Sheet for Healthcare Providers: BankingDealers.co.za  This test is not yet approved or  cleared by the [place] FDA and has been authorized for detection and/or diagnosis of SARS-CoV-2 by FDA under an Emergency Use Authorization (EUA).  This EUA will remain in effect (meaning this test can be used) for the duration of the COVID-19 declaration under Section 564(b)(1) of the Act, 21 U.S.C. section 360bbb-3(b)(1), unless the authorization is terminated or revoked sooner.  Performed at [facility], [address], [place], [place] [address]   Brain natriuretic peptide     Status: Abnormal   Collection Time: 12/20/19  8:30 PM  Result Value Ref Range   B Natriuretic Peptide 2,059.0 (H) 0.0 - 100.0 pg/mL    Comment: Performed at [facility], [address], [place], [place] [address]  CBC with Differential     Status: Abnormal   Collection Time: 12/20/19  8:30 PM  Result Value Ref Range   WBC 4.7 4.0 - 10.5 K/uL   RBC 4.33 4.22 - 5.81 MIL/uL   Hemoglobin 11.5 (L) 13.0 - 17.0 g/dL   HCT 38.3 (L) 39 - 52 %   MCV 88.5 80.0 - 100.0 fL   MCH 26.6 26.0 - 34.0 pg   MCHC 30.0 30.0 - 36.0 g/dL   RDW 16.2 (H) 11.5 - 15.5 %   Platelets 178 150 - 400 K/uL   nRBC 0.0 0.0 - 0.2 %   Neutrophils Relative % 71 %   Neutro Abs 3.3 1.7 - 7.7 K/uL   Lymphocytes Relative 21 %   Lymphs Abs 1.0 0.7 - 4.0 K/uL   Monocytes Relative 6 %    Monocytes Absolute 0.3 0 - 1 K/uL   Eosinophils Relative 2 %   Eosinophils Absolute 0.1 0 - 0 K/uL   Basophils Relative 0 %   Basophils Absolute 0.0 0 - 0 K/uL   Immature Granulocytes 0 %   Abs Immature Granulocytes 0.01 0.00 - 0.07 K/uL    Comment: Performed at [facility], [place]  [address] [place], [place] [address]  Comprehensive metabolic panel     Status: Abnormal   Collection Time: 12/20/19  8:58 PM  Result Value Ref Range   Sodium 142 135 - 145 mmol/L   Potassium 4.3 3.5 - 5.1 mmol/L   Chloride 109 98 - 111 mmol/L   CO2 25 22 - 32 mmol/L   Glucose, Bld 109 (H) 70 - 99 mg/dL    Comment: Glucose reference range applies only to samples taken after fasting for at least 8 hours.   BUN 22 8 - 23 mg/dL   Creatinine, Ser 1.31 (H) 0.61 - 1.24 mg/dL   Calcium 9.0 8.9 - 10.3 mg/dL   Total Protein 7.2 6.5 - 8.1 g/dL   Albumin 3.9 3.5 - 5.0 g/dL   AST 20 15 - 41 U/L   ALT 15 0 - 44 U/L   Alkaline Phosphatase 126 38 - 126 U/L   Total Bilirubin 0.8 0.3 - 1.2 mg/dL   GFR calc non Af Amer 46 (L) >60 mL/min   GFR calc Af Amer 53 (L) >60 mL/min   Anion gap 8 5 - 15    Comment: Performed at [facility], [address], [place], [place] [address]  Troponin I (High Sensitivity)     Status: Abnormal   Collection Time: 12/20/19  8:58 PM  Result Value Ref Range   Troponin I (High Sensitivity) 19 (H) <18 ng/L    Comment: (NOTE) Elevated high sensitivity troponin I (hsTnI) values and significant  changes across serial measurements may suggest ACS but many other  chronic and acute conditions are known to elevate hsTnI results.  Refer to the "Links" section for chest pain algorithms and additional  guidance. Performed at [facility], [address], [place], [place] [address]   Protime-INR     Status: Abnormal   Collection Time: 12/20/19  8:58 PM  Result Value Ref Range   Prothrombin Time 43.0 (H) 11.4 - 15.2 seconds   INR 4.7 (HH) 0.8 - 1.2    Comment: CRITICAL RESULT CALLED TO, READ BACK  BY AND VERIFIED WITH: B BAYNES,RN@2218  12/20/19 MKELLY (NOTE) INR goal varies based on device and disease states. Performed at [facility], [address] [place], [place] [address]   Troponin I (High Sensitivity)     Status: Abnormal   Collection Time: 12/20/19  9:54 PM  Result Value Ref Range   Troponin I (High Sensitivity) 21 (H) <18 ng/L    Comment: (NOTE) Elevated high sensitivity troponin I (hsTnI) values and significant  changes across serial measurements may suggest ACS but many other  chronic and acute conditions are known to elevate hsTnI results.  Refer to the "Links" section for chest pain algorithms and additional  guidance. Performed at [facility], [address] [place],  [address]   Comprehensive metabolic panel     Status: Abnormal   Collection Time: 12/21/19  3:04 AM  Result Value Ref Range   Sodium 145 135 - 145 mmol/L   Potassium 3.7 3.5 - 5.1 mmol/L   Chloride 106 98 - 111 mmol/L   CO2 28 22 - 32 mmol/L   Glucose, Bld 95 70 - 99 mg/dL    Comment: Glucose reference range applies only to samples taken after fasting for at least 8 hours.   BUN 22 8 - 23 mg/dL   Creatinine, Ser 1.34 (H) 0.61 - 1.24 mg/dL   Calcium 9.1 8.9 - 10.3 mg/dL   Total Protein 6.6 6.5 - 8.1 g/dL  Albumin 3.7 3.5 - 5.0 g/dL   AST 16 15 - 41 U/L   ALT 14 0 - 44 U/L   Alkaline Phosphatase 124 38 - 126 U/L   Total Bilirubin 1.1 0.3 - 1.2 mg/dL   GFR calc non Af Amer 44 (L) >60 mL/min   GFR calc Af Amer 51 (L) >60 mL/min   Anion gap 11 5 - 15    Comment: Performed at [facility], [address], [place], [place] [address]  CBC     Status: Abnormal   Collection Time: 12/21/19  3:04 AM  Result Value Ref Range   WBC 4.6 4.0 - 10.5 K/uL   RBC 4.34 4.22 - 5.81 MIL/uL   Hemoglobin 11.4 (L) 13.0 - 17.0 g/dL   HCT 37.9 (L) 39 - 52 %   MCV 87.3 80.0 - 100.0 fL   MCH 26.3 26.0 - 34.0 pg   MCHC 30.1 30.0 - 36.0 g/dL   RDW 16.0 (H) 11.5 - 15.5 %   Platelets 169 150 - 400 K/uL    nRBC 0.0 0.0 - 0.2 %    Comment: Performed at [facility], [address] [place], [place] [address]  Protime-INR     Status: Abnormal   Collection Time: 12/21/19  3:04 AM  Result Value Ref Range   Prothrombin Time 41.1 (H) 11.4 - 15.2 seconds   INR 4.5 (HH) 0.8 - 1.2    Comment: CRITICAL RESULT CALLED TO, READ BACK BY AND VERIFIED WITH: K WATLINGTON,RN @0419  12/21/19 MKELLY (NOTE) INR goal varies based on device and disease states. Performed at [facility], [address], [place], [place] [address]   Magnesium     Status: None   Collection Time: 12/21/19  3:04 AM  Result Value Ref Range   Magnesium 2.1 1.7 - 2.4 mg/dL    Comment: Performed at [facility], [address] [place], [place] [address]  Phosphorus     Status: None   Collection Time: 12/21/19  3:04 AM  Result Value Ref Range   Phosphorus 3.7 2.5 - 4.6 mg/dL    Comment: Performed at [facility], [address], [place], [place] [address]  Troponin I (High Sensitivity)     Status: Abnormal   Collection Time: 12/21/19  3:04 AM  Result Value Ref Range   Troponin I (High Sensitivity) 20 (H) <18 ng/L    Comment: (NOTE) Elevated high sensitivity troponin I (hsTnI) values and significant  changes across serial measurements may suggest ACS but many other  chronic and acute conditions are known to elevate hsTnI results.  Refer to the "Links" section for chest pain algorithms and additional  guidance. Performed at [facility], [address], [place], [place] [address]   Troponin I (High Sensitivity)     Status: Abnormal   Collection Time: 12/21/19  6:05 AM  Result Value Ref Range   Troponin I (High Sensitivity) 22 (H) <18 ng/L    Comment: (NOTE) Elevated high sensitivity troponin I (hsTnI) values and significant  changes across serial measurements may suggest ACS but many other  chronic and acute conditions are known to elevate hsTnI results.  Refer to the "Links" section for chest pain algorithms and additional   guidance. Performed at [facility], [address], [place],  [address]   Urinalysis, Routine w reflex microscopic     Status: Abnormal   Collection Time: 12/21/19  9:37 AM  Result Value Ref Range   Color, Urine AMBER (A) YELLOW   APPearance  CLOUDY (A) CLEAR   Specific Gravity, Urine 1.009 1.005 - 1.030   pH 6.0 5.0 - 8.0   Glucose, UA NEGATIVE NEGATIVE mg/dL   Hgb urine dipstick MODERATE (A) NEGATIVE   Bilirubin Urine NEGATIVE NEGATIVE   Ketones, ur NEGATIVE NEGATIVE mg/dL   Protein, ur 100 (A) NEGATIVE mg/dL   Nitrite NEGATIVE NEGATIVE   Leukocytes,Ua NEGATIVE NEGATIVE   RBC / HPF >50 (H) 0 - 5 RBC/hpf   WBC, UA >50 (H) 0 - 5 WBC/hpf   Bacteria, UA NONE SEEN NONE SEEN   WBC Clumps PRESENT    Budding Yeast PRESENT     Comment: Performed at [facility], [address], [place], [place] [address]   *Note: Due to a large number of results and/or encounters for the requested time period, some results have not been displayed. A complete set of results can be found in Results Review.   DG Chest Portable 1 View  Result Date: 12/20/2019 CLINICAL DATA:  Shortness of breath, weakness, cough. EXAM: PORTABLE CHEST 1 VIEW COMPARISON:  Radiograph 08/30/2019.  CT 07/04/2018 FINDINGS: Chronic cardiomegaly. Unchanged mediastinal contours. Aortic atherosclerosis. Minimal subsegmental opacities at the lung bases. No confluent consolidation. No pneumothorax or pleural effusion. No pulmonary edema. No acute osseous abnormalities are seen. IMPRESSION: Chronic cardiomegaly. Minimal subsegmental bibasilar atelectasis. Electronically Signed   By: Keith Rake M.D.   On: 12/20/2019 20:56    Pending Labs Unresulted Labs (From admission, onward)          Start     Ordered   12/22/19 0500  CBC with Differential/Platelet  Tomorrow morning,   R        12/21/19 0931   12/22/19 3428  Basic metabolic panel  Tomorrow morning,   R        12/21/19 0931   12/22/19 0500  Magnesium  Tomorrow morning,    STAT        12/21/19 0931   12/22/19 0500  Protime-INR  Tomorrow morning,   R        12/21/19 0931   12/20/19 2032  Urinalysis, Routine w reflex microscopic  ONCE - STAT,   STAT        12/20/19 2031          Vitals/Pain Today's Vitals   12/21/19 1200 12/21/19 1300 12/21/19 1400 12/21/19 1415  BP: (!) 128/95 (!) 119/47 (!) 145/68   Pulse: 62 (!) 56    Resp: (!) 24 20  (!) 21  Temp:      TempSrc:      SpO2: 95% 99%    Weight:      Height:      PainSc:        Isolation Precautions No active isolations  Medications Medications  carvedilol (COREG) tablet 6.25 mg (6.25 mg Oral Given 12/21/19 0712)  pantoprazole (PROTONIX) EC tablet 40 mg (40 mg Oral Given 12/21/19 1000)  mirabegron ER (MYRBETRIQ) tablet 25 mg (25 mg Oral Given 12/21/19 1335)  feeding supplement (ENSURE ENLIVE) (ENSURE ENLIVE) liquid 237 mL (has no administration in time range)  albuterol (PROVENTIL) (2.5 MG/3ML) 0.083% nebulizer solution 2.5 mg (2.5 mg Nebulization Given 12/21/19 0943)  donepezil (ARICEPT) tablet 10 mg (has no administration in time range)  fluticasone (FLONASE) 50 MCG/ACT nasal spray 1 spray (has no administration in time range)  febuxostat (ULORIC) tablet 40 mg (40 mg Oral Given 12/21/19 1334)  albuterol (PROVENTIL) (2.5 MG/3ML) 0.083% nebulizer solution 3 mL (has no administration in time range)  simvastatin (ZOCOR)  tablet 10 mg (10 mg Oral Given 12/21/19 1001)  furosemide (LASIX) injection 40 mg (40 mg Intravenous Given 12/20/19 2209)    Mobility manual wheelchair     Focused Assessments    R Recommendations: See Admitting Provider Note  Report given to:   Additional Notes:

## 2019-12-21 NOTE — Progress Notes (Signed)
*  PRELIMINARY RESULTS* Echocardiogram 2D Echocardiogram has been performed.  Leavy Cella 12/21/2019, 3:54 PM

## 2019-12-21 NOTE — Progress Notes (Signed)
PROGRESS NOTE  Scott [name]  DOB: 02/28/1922  PCP: Worthy Rancher, MD HKV:[id number]  DOA: 12/20/2019  LOS: 1 day   Chief Complaint  Patient presents with  . Congestive Heart Failure    Brief narrative: Scott [name] is a 86 y.o. male with PMH of HTN, HLD, A. 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  Associated with low oxygen saturation, cough. Of note, for 1 to 2 days prior to presentation, patient has also been noticing some blood in urine.  In the ED, patient was tachypneic, hypoxic to 70s, which improved with supplemental oxygen. INR elevated to 5.1. COVID-19 PCR negative.  Patient has completed vaccination. Chest x-ray showed chronic cardiomegaly with minimal subsegmental bibasilar atelectasis.  IV Lasix 40 Mg x1 was given.  Patient was admitted to hospitalist service for further evaluation management.  Subjective: Patient was seen and examined this morning in the ED. Elderly African-American male.  Lying on bed.  Wakes up on verbal command. Not in distress.  On low-flow oxygen.  Canister with concentrated/blood tinged urine. Looks euvolemic to me  Assessment/Plan: Acute on chronic respiratory failure with hypoxia -Likely secondary to CHF exacerbation. -On PRN O2 at home.   -Oxygen saturation less than 80% on room air.  Improved with supplemental oxygen.   -BNP level was elevated to more than 2000, trend as below. -Chest x-ray did not show any obvious pulmonary edema edema -Given IV Lasix in the ED, received another dose this morning. -Already looks euvolemic on my exam this morning however still requiring supplemental oxygen. -Pending  echocardiogram. -Continue to monitor output, weight, renal function and electrolytes. -Continue Coreg 6.25 mg twice daily. Recent Labs    07/02/19 1247 08/30/19 2221 12/20/19 2030  BNP 1,193.0* 841.0* 2,059.0*   Elevated troponin possible secondary to type II demand ischemia Troponin 19> 21; patient denies any chest pain EKG showed sinus rhythm at rate of 80 bpm with multiform PVCs Continue to trend troponin level  h/o A. Fib/DVT/PE on warfarin Supratherapeutic INR -EKG currently in sinus rhythm -INR 5.1> 4.7 -Warfarin temporarily held at this time -Continue to monitor INR Recent Labs    07/05/19 0610 07/06/19 0526 07/08/19 0634 07/09/19 0758 08/07/19 1200 08/30/19 2221 10/01/19 1926 12/19/19 1028 12/20/19 2030 12/21/19 0304  HGB 12.1* 11.8* 11.6* 11.1* 12.8* 12.7* 12.3* 11.8* 11.5* 11.4*   Suspected hematuria history of recurrent prostatic urethra tumor s/p TURBT in July 2021 -Concentrated urine versus hematuria secondary to supratherapeutic INR -Obtain urinalysis -Obtain bladder scan to rule out urinary retention  COPD -Continue home albuterol, Combivent  GERD Continue home Protonix  Dementia  -usually oriented to person and place. -occasional hallucination  Impaired mobility h/o peripheral vascular disease sp left AKA status -transfers OOB by himself -uses powered wheel chair at home  Mobility: Limited mobility because of left AKA status. PT eval Code Status:   Code Status: DNR  Nutritional status: Body mass index is 24.52 kg/m.     Diet Order            Diet Heart Room service appropriate? Yes; Fluid consistency: Thin  Diet effective now                 DVT prophylaxis:  Place and maintain sequential compression device Start: 12/21/19 0940SCDs   Antimicrobials:  None Fluid: None Consultants: None Family Communication: called and updated patient's nephew Mr. [name].   Status is: Inpatient  Remains inpatient appropriate  because:Ongoing diagnostic testing needed not appropriate for outpatient work up and IV treatments appropriate due to intensity of illness or inability to take PO   Dispo: The patient is from: Home              Anticipated d/c is to: Home likely.  Pending PT eval              Anticipated d/c date is: 2 days              Patient currently is not medically stable to d/c.       Infusions:    Scheduled Meds: . albuterol  2.5 mg Nebulization Daily  . carvedilol  6.25 mg Oral BID WC  . donepezil  10 mg Oral QHS  . febuxostat  40 mg Oral Daily  . mirabegron ER  25 mg Oral Daily  . pantoprazole  40 mg Oral Daily  . simvastatin  10 mg Oral Daily    Antimicrobials: Anti-infectives (From admission, onward)   None      PRN meds: albuterol, feeding supplement (ENSURE ENLIVE), fluticasone   Objective: Vitals:   12/21/19 0800 12/21/19 0945  BP: (!) 128/53   Pulse: 68   Resp: (!) 24   Temp:    SpO2: 96% 95%    Intake/Output Summary (Last 24 hours) at 12/21/2019 0953 Last data filed at 12/21/2019 0728 Gross per 24 hour  Intake --  Output 700 ml  Net -700 ml   Filed Weights   12/20/19 1959  Weight: 68.9 kg   Weight change:  Body mass index is 24.52 kg/m.   Physical Exam: General exam: Appears calm and comfortable.  Not in physical distress Skin: No rashes, lesions or ulcers. HEENT: Atraumatic, normocephalic, supple neck, no obvious bleeding Lungs: Clear to auscultation bilaterally CVS: Regular rate and rhythm, no murmur GI/Abd soft, nontender, nondistended, bowel sound present CNS: Wakes up on verbal command.  Oriented to place Psychiatry: Not restless or agitated Extremities: Left AKA status, right leg without any pedal edema or calf tenderness.  Data Review: I have personally reviewed the laboratory data and studies available.  Recent Labs  Lab 12/19/19 1028 12/20/19 2030 12/21/19 0304  WBC 3.5 4.7 4.6  NEUTROABS 2.1 3.3  --   HGB 11.8* 11.5* 11.4*  HCT  37.2* 38.3* 37.9*  MCV 82 88.5 87.3  PLT 187 178 169   Recent Labs  Lab 12/19/19 1028 12/20/19 2058 12/21/19 0304  NA 146* 142 145  K 3.8 4.3 3.7  CL 108* 109 106  CO2 24 25 28   GLUCOSE 140* 109* 95  BUN 24 22 22   CREATININE 1.25 1.31* 1.34*  CALCIUM 9.8 9.0 9.1  MG  --   --  2.1  PHOS  --   --  3.7   Signed, Terrilee Croak, MD [facility] 12/21/2019

## 2019-12-21 NOTE — ED Notes (Signed)
Waiting on other 1000 meds form pharmacy. Meal tray given and set up. Nad.

## 2019-12-21 NOTE — ED Notes (Signed)
Pt resting. Nad. A/o. Linens wet. Linens changed and pt cleaned.

## 2019-12-22 ENCOUNTER — Encounter (HOSPITAL_COMMUNITY): Payer: Self-pay | Admitting: Internal Medicine

## 2019-12-22 LAB — CBC WITH DIFFERENTIAL/PLATELET
Abs Immature Granulocytes: 0.01 10*3/uL (ref 0.00–0.07)
Basophils Absolute: 0 10*3/uL (ref 0.0–0.1)
Basophils Relative: 0 %
Eosinophils Absolute: 0.1 10*3/uL (ref 0.0–0.5)
Eosinophils Relative: 3 %
HCT: 37.3 % — ABNORMAL LOW (ref 39.0–52.0)
Hemoglobin: 11.4 g/dL — ABNORMAL LOW (ref 13.0–17.0)
Immature Granulocytes: 0 %
Lymphocytes Relative: 24 %
Lymphs Abs: 1.1 10*3/uL (ref 0.7–4.0)
MCH: 26.6 pg (ref 26.0–34.0)
MCHC: 30.6 g/dL (ref 30.0–36.0)
MCV: 87.1 fL (ref 80.0–100.0)
Monocytes Absolute: 0.4 10*3/uL (ref 0.1–1.0)
Monocytes Relative: 9 %
Neutro Abs: 3.1 10*3/uL (ref 1.7–7.7)
Neutrophils Relative %: 64 %
Platelets: 178 10*3/uL (ref 150–400)
RBC: 4.28 MIL/uL (ref 4.22–5.81)
RDW: 15.9 % — ABNORMAL HIGH (ref 11.5–15.5)
WBC: 4.7 10*3/uL (ref 4.0–10.5)
nRBC: 0 % (ref 0.0–0.2)

## 2019-12-22 LAB — BASIC METABOLIC PANEL
Anion gap: 10 (ref 5–15)
BUN: 31 mg/dL — ABNORMAL HIGH (ref 8–23)
CO2: 31 mmol/L (ref 22–32)
Calcium: 9.1 mg/dL (ref 8.9–10.3)
Chloride: 103 mmol/L (ref 98–111)
Creatinine, Ser: 1.34 mg/dL — ABNORMAL HIGH (ref 0.61–1.24)
GFR calc Af Amer: 51 mL/min — ABNORMAL LOW (ref >60)
GFR calc non Af Amer: 44 mL/min — ABNORMAL LOW (ref >60)
Glucose, Bld: 87 mg/dL (ref 70–99)
Potassium: 3.6 mmol/L (ref 3.5–5.1)
Sodium: 144 mmol/L (ref 135–145)

## 2019-12-22 LAB — PROTIME-INR
INR: 4 — ABNORMAL HIGH (ref 0.8–1.2)
Prothrombin Time: 37.5 seconds — ABNORMAL HIGH (ref 11.4–15.2)

## 2019-12-22 LAB — MAGNESIUM: Magnesium: 2 mg/dL (ref 1.7–2.4)

## 2019-12-22 NOTE — Progress Notes (Signed)
PROGRESS NOTE  Scott [name]  DOB: [birth year]  PCP: Worthy Rancher, MD QMV:[id number]  DOA: 12/20/2019  LOS: 2 days   Chief Complaint  Patient presents with  . Congestive Heart Failure    Brief narrative: Scott [name] is a 86 y.o. male with PMH of HTN, HLD, A. 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  Associated with low oxygen saturation, cough. Of note, for 1 to 2 days prior to presentation, patient has also been noticing some blood in urine.  In the ED, patient was tachypneic, hypoxic to 70s, which improved with supplemental oxygen. INR elevated to 5.1. COVID-19 PCR negative.  Patient has completed vaccination. Chest x-ray showed chronic cardiomegaly with minimal subsegmental bibasilar atelectasis.  IV Lasix 40 Mg x1 was given.  Patient was admitted to hospitalist service for further evaluation management.  Subjective: Patient was seen and examined this morning. Sitting up in chair.  On low-flow oxygen. Son at bedside. Patient still has blood-tinged urine. Patient feels better otherwise.  Waiting for PT eval.  Assessment/Plan: Acute on chronic respiratory failure with hypoxia -Likely secondary to CHF exacerbation. -On PRN O2 at home.   -Oxygen saturation less than 80% on room air. Improved with supplemental oxygen.   -BNP level was elevated to more than 2000, trend as below. -Chest x-ray did not show any obvious pulmonary edema -Given IV Lasix in the ED, euvolemic now.  Echocardiogram with a EF 25%, moderate LVH, which is same as echo from March 2021. -Renal function stable. -Continue to monitor output, weight, renal function and  electrolytes. -Continue Coreg 6.25 mg twice daily. Recent Labs    07/02/19 1247 08/30/19 2221 12/20/19 2030  BNP 1,193.0* 841.0* 2,059.0*   CKD 3a -Slight worsening of renal function, not enough to call AKI.  Continue monitoring Recent Labs    07/06/19 0526 07/08/19 0634 07/09/19 0758 08/07/19 1200 08/30/19 2221 10/01/19 1926 12/19/19 1028 12/20/19 2058 12/21/19 0304 12/22/19 0722  BUN 48* 40* 42* 37* 25* 29* $Remov'24 22 22 'schLSH$ 31*  CREATININE 1.23 1.10 1.17 1.69* 1.30* 1.15 1.25 1.31* 1.34* 1.34*   Elevated troponin possible secondary to type II demand ischemia -Troponin 19> 21; patient denies any chest pain -EKG showed sinus rhythm at rate of 80 bpm with multiform PVCs -Continue to trend troponin level  h/o A. Fib/DVT/PE on warfarin Supratherapeutic INR -EKG currently in sinus rhythm -INR was 5.1 on admission.  Improving, INR 4 today. -Warfarin temporarily held at this time -Continue to monitor INR. -I encouraged patient and his son to discuss with primary care provider about switching him from Coumadin to NOAC. Recent Labs  Lab 12/19/19 0947 12/20/19 2058 12/21/19 0304 12/22/19 0722  INR 5.1* 4.7* 4.5* 4.0*   Suspected hematuria history of recurrent prostatic urethra tumor s/p TURBT in July 2021 -Concentrated urine versus hematuria secondary to supratherapeutic INR -Not having urine retention.  Hemoglobin remained stable as below. -Continue to monitor. Recent Labs    07/06/19 0526 07/08/19 0634 07/09/19 0758 08/07/19 1200 08/30/19 2221 10/01/19 1926 12/19/19 1028 12/20/19 2030 12/21/19 0304 12/22/19 0722  HGB 11.8* 11.6* 11.1* 12.8* 12.7* 12.3* 11.8* 11.5* 11.4* 11.4*   COPD -Continue home albuterol, Combivent  GERD Continue home Protonix  Dementia  -  usually oriented to person and place. -occasional hallucination  Impaired mobility h/o peripheral vascular disease sp left AKA status -transfers OOB by himself -uses powered wheel chair at  home -Pending PT eval.  Mobility: Limited mobility because of left AKA status. PT eval Code Status:   Code Status: DNR  Nutritional status: Body mass index is 22.2 kg/m.     Diet Order            Diet Heart Room service appropriate? Yes; Fluid consistency: Thin  Diet effective now                 DVT prophylaxis: Place and maintain sequential compression device Start: 12/21/19 0940SCDs   Antimicrobials:  None Fluid: None Consultants: None Family Communication:  Met with patient's son Mr. [name] at bedside.  Status is: Inpatient  Remains inpatient appropriate because: INR still remains elevated, continues to have hematuria.  Pending PT eval.  Dispo: The patient is from: Home              Anticipated d/c is to: Home likely.  Pending PT eval              Anticipated d/c date is: 2 days              Patient currently is not medically stable to d/c.       Infusions:    Scheduled Meds: . albuterol  2.5 mg Nebulization Daily  . carvedilol  6.25 mg Oral BID WC  . donepezil  10 mg Oral QHS  . febuxostat  40 mg Oral Daily  . mirabegron ER  25 mg Oral Daily  . pantoprazole  40 mg Oral Daily  . simvastatin  10 mg Oral Daily    Antimicrobials: Anti-infectives (From admission, onward)   None      PRN meds: albuterol, feeding supplement (ENSURE ENLIVE), fluticasone   Objective: Vitals:   12/22/19 0650 12/22/19 1154  BP: (!) 142/66   Pulse: 77   Resp: 18   Temp: 99.1 F (37.3 C)   SpO2: (!) 85% 95%    Intake/Output Summary (Last 24 hours) at 12/22/2019 1402 Last data filed at 12/21/2019 1600 Gross per 24 hour  Intake --  Output 500 ml  Net -500 ml   Filed Weights   12/20/19 1959 12/21/19 1657  Weight: 68.9 kg 62.4 kg   Weight change: -6.5 kg Body mass index is 22.2 kg/m.   Physical Exam: General exam: Appears calm and comfortable.  Not in physical distress.  Catheter with blood-tinged urine Skin: No rashes, lesions or ulcers. HEENT: Atraumatic,  normocephalic, supple neck, no obvious bleeding Lungs: Clear to auscultation bilaterally CVS: Regular rate and rhythm, no murmur GI/Abd soft, nontender, nondistended, bowel sound present CNS: Alert, awake, oriented x3 Psychiatry: Not restless or agitated Extremities: Left AKA status, right leg without any pedal edema or calf tenderness.  Data Review: I have personally reviewed the laboratory data and studies available.  Recent Labs  Lab 12/19/19 1028 12/20/19 2030 12/21/19 0304 12/22/19 0722  WBC 3.5 4.7 4.6 4.7  NEUTROABS 2.1 3.3  --  3.1  HGB 11.8* 11.5* 11.4* 11.4*  HCT 37.2* 38.3* 37.9* 37.3*  MCV 82 88.5 87.3 87.1  PLT 187 178 169 178   Recent Labs  Lab 12/19/19 1028 12/20/19 2058 12/21/19 0304 12/22/19 0722  NA 146* 142 145 144  K 3.8 4.3 3.7 3.6  CL 108* 109 106 103  CO2 _0 GLUCOSE 140* 109* 95  87  BUN _0 31*  CREATININE 1.25 1.31* 1.34* 1.34*  CALCIUM 9.8 9.0 9.1 9.1  MG  --   --  2.1 2.0  PHOS  --   --  3.7  --    Signed, Terrilee Croak, MD [facility] 12/22/2019

## 2019-12-22 NOTE — Evaluation (Signed)
Physical Therapy Evaluation Patient Details Name: Scott [name] MRN: [id number] DOB: 1921-05-27 Today's Date: 12/22/2019   History of Present Illness  Scott [name] is a 86 y.o. male with medical history significant for A. 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  Patient was also noted to have some blood in the urine since yesterday.  O2 sat at home was reported to be in the 80s, this was 77 on arrival to the ED.  Patient also have nonproductive cough and a complaint of mild increase swelling in knees right leg.  Patient has had Covid vaccine.  He denies fever, headache, chest pain, nausea, vomiting or abdominal pain.    Clinical Impression  Patient functioning near baseline for functional mobility demonstrating slow labored movement during bed<>chair transfers without loss of balance, good return for bed mobility and continued sitting up in chair after therapy.  Patient instructed in HEP with fair/good return demonstrated with verbal cues and demonstration and given written instructions to he and his son.  Patient will benefit from continued physical therapy in hospital and recommended venue below to increase strength, balance, endurance for safe ADLs and gait.     Follow Up Recommendations Home health PT;Supervision/Assistance - 24 hour;Supervision for mobility/OOB    Equipment Recommendations  None recommended by PT    Recommendations for Other Services       Precautions / Restrictions Precautions Precautions: Fall Restrictions Weight Bearing Restrictions: No      Mobility  Bed Mobility Overal bed mobility: Modified Independent             General bed mobility comments: increased  time  Transfers Overall transfer level: Needs assistance Equipment used: 1 person hand held assist Transfers: Sit to/from Stand;Stand Pivot Transfers Sit to Stand: Min guard Stand pivot transfers: Min assist       General transfer comment: increased time, labored movement  Ambulation/Gait                Stairs            Wheelchair Mobility    Modified Rankin (Stroke Patients Only)       Balance Overall balance assessment: Needs assistance Sitting-balance support: Feet supported;No upper extremity supported Sitting balance-Leahy Scale: Good Sitting balance - Comments: seated at EOB   Standing balance support: During functional activity;Bilateral upper extremity supported Standing balance-Leahy Scale: Poor Standing balance comment: has to lean on armrest of chair and use bedrail to complete transfers                             Pertinent Vitals/Pain Pain Assessment: No/denies pain    Home Living Family/patient expects to be discharged to:: Private residence Living Arrangements: Alone Available Help at Discharge: Personal care attendant;Available 24 hours/day Type of Home: House Home Access: Ramped entrance     Home Layout: One level Home Equipment: Wheelchair - power;Walker - 2 wheels;Shower seat;Shower seat - built in;Bedside commode Additional Comments: Pt reports PCA 24 hrs/day    Prior Function Level of Independence: Needs assistance   Gait / Transfers Assistance Needed: Non-ambulatory, uses power wheelchair for mobility, Mod Indep stand pivot transfers  ADL's / Homemaking Assistance Needed: assisted for  bathing, dressing and household ADLs by paid attendants 24/7        Hand Dominance   Dominant Hand: Right    Extremity/Trunk Assessment   Upper Extremity Assessment Upper Extremity Assessment: Generalized weakness    Lower Extremity Assessment Lower Extremity Assessment: Generalized weakness    Cervical / Trunk  Assessment Cervical / Trunk Assessment: Kyphotic  Communication   Communication: HOH  Cognition Arousal/Alertness: Awake/alert Behavior During Therapy: WFL for tasks assessed/performed Overall Cognitive Status: Within Functional Limits for tasks assessed                                        General Comments      Exercises General Exercises - Lower Extremity Long Arc Quad: Seated;AROM;Strengthening;Both;15 reps Hip Flexion/Marching: Seated;AROM;Strengthening;Both;15 reps Toe Raises: Seated;AROM;Strengthening;Both;15 reps Heel Raises: Seated;AROM;Strengthening;Both;15 reps   Assessment/Plan    PT Assessment Patient needs continued PT services  PT Problem List Decreased strength;Decreased activity tolerance;Decreased balance;Decreased mobility       PT Treatment Interventions Balance training;Functional mobility training;Therapeutic activities;Therapeutic exercise;Patient/family education    PT Goals (Current goals can be found in the Care Plan section)  Acute Rehab PT Goals Patient Stated Goal: return home with caregivers to assist PT Goal Formulation: With patient/family Time For Goal Achievement: 12/25/19 Potential to Achieve Goals: Good    Frequency Min 3X/week   Barriers to discharge        Co-evaluation               AM-PAC PT "6 Clicks" Mobility  Outcome Measure Help needed turning from your back to your side while in a flat bed without using bedrails?: None Help needed moving from lying on your back to sitting on the side of a flat bed without using bedrails?: None Help needed moving to and from a bed to a chair (including a wheelchair)?: A Little Help needed standing up from a chair using your arms (e.g., wheelchair or bedside chair)?: A Little Help needed to walk in hospital room?: A Lot Help needed climbing 3-5 steps with a railing? : Total 6 Click Score: 17    End of Session Equipment Utilized During Treatment: Oxygen Activity  Tolerance: Patient tolerated treatment well;Patient limited by fatigue Patient left: in chair;with call bell/phone within reach Nurse Communication: Mobility status PT Visit Diagnosis: Unsteadiness on feet (R26.81);Other abnormalities of gait and mobility (R26.89);Muscle weakness (generalized) (M62.81)    Time: 9798-9211 PT Time Calculation (min) (ACUTE ONLY): 23 min   Charges:   PT Evaluation $PT Eval Moderate Complexity: 1 Mod PT Treatments $Therapeutic Activity: 23-37 mins       12:18 PM, 12/22/19 Lonell Grandchild, MPT Physical Therapist with [facility] [phone] office [phone] mobile phone

## 2019-12-22 NOTE — Plan of Care (Signed)
  Problem: Acute Rehab PT Goals(only PT should resolve) Goal: Pt Will Go Supine/Side To Sit Outcome: Progressing Flowsheets (Taken 12/22/2019 1220) Pt will go Supine/Side to Sit:  Independently  with modified independence Goal: Patient Will Perform Sitting Balance Outcome: Progressing Flowsheets (Taken 12/22/2019 1220) Patient will perform sitting balance: Independently Goal: Patient Will Transfer Sit To/From Stand Outcome: Progressing Flowsheets (Taken 12/22/2019 1220) Patient will transfer sit to/from stand: with supervision Goal: Pt Will Transfer Bed To Chair/Chair To Bed Outcome: Progressing Flowsheets (Taken 12/22/2019 1220) Pt will Transfer Bed to Chair/Chair to Bed: with supervision   12:21 PM, 12/22/19 Lonell Grandchild, MPT Physical Therapist with [facility] [phone] office [phone] mobile phone

## 2019-12-23 ENCOUNTER — Telehealth: Payer: Self-pay | Admitting: Nurse Practitioner

## 2019-12-23 DIAGNOSIS — Z86711 Personal history of pulmonary embolism: Secondary | ICD-10-CM

## 2019-12-23 LAB — BASIC METABOLIC PANEL
Anion gap: 7 (ref 5–15)
BUN: 25 mg/dL — ABNORMAL HIGH (ref 8–23)
CO2: 31 mmol/L (ref 22–32)
Calcium: 9.1 mg/dL (ref 8.9–10.3)
Chloride: 103 mmol/L (ref 98–111)
Creatinine, Ser: 1.05 mg/dL (ref 0.61–1.24)
GFR calc Af Amer: >60 mL/min (ref >60)
GFR calc non Af Amer: 60 mL/min — ABNORMAL LOW (ref >60)
Glucose, Bld: 80 mg/dL (ref 70–99)
Potassium: 3.6 mmol/L (ref 3.5–5.1)
Sodium: 141 mmol/L (ref 135–145)

## 2019-12-23 LAB — CBC WITH DIFFERENTIAL/PLATELET
Abs Immature Granulocytes: 0.01 10*3/uL (ref 0.00–0.07)
Basophils Absolute: 0 10*3/uL (ref 0.0–0.1)
Basophils Relative: 1 %
Eosinophils Absolute: 0.2 10*3/uL (ref 0.0–0.5)
Eosinophils Relative: 4 %
HCT: 36.1 % — ABNORMAL LOW (ref 39.0–52.0)
Hemoglobin: 10.9 g/dL — ABNORMAL LOW (ref 13.0–17.0)
Immature Granulocytes: 0 %
Lymphocytes Relative: 29 %
Lymphs Abs: 1.1 10*3/uL (ref 0.7–4.0)
MCH: 26.6 pg (ref 26.0–34.0)
MCHC: 30.2 g/dL (ref 30.0–36.0)
MCV: 88 fL (ref 80.0–100.0)
Monocytes Absolute: 0.4 10*3/uL (ref 0.1–1.0)
Monocytes Relative: 10 %
Neutro Abs: 2.2 10*3/uL (ref 1.7–7.7)
Neutrophils Relative %: 56 %
Platelets: 176 10*3/uL (ref 150–400)
RBC: 4.1 MIL/uL — ABNORMAL LOW (ref 4.22–5.81)
RDW: 15.9 % — ABNORMAL HIGH (ref 11.5–15.5)
WBC: 3.9 10*3/uL — ABNORMAL LOW (ref 4.0–10.5)
nRBC: 0 % (ref 0.0–0.2)

## 2019-12-23 LAB — MAGNESIUM: Magnesium: 2.1 mg/dL (ref 1.7–2.4)

## 2019-12-23 LAB — BRAIN NATRIURETIC PEPTIDE: B Natriuretic Peptide: 905 pg/mL — ABNORMAL HIGH (ref 0.0–100.0)

## 2019-12-23 NOTE — Discharge Summary (Signed)
Physician Discharge Summary  Nicklaus Alviar Dieter RWE:[id number] DOB: 01-14-1922 DOA: 12/20/2019  PCP: Dettinger, Fransisca Kaufmann, MD  Admit date: 12/20/2019 Discharge date: 12/23/2019  Admitted From: Home Discharge disposition: Home with home health, PT   Code Status: DNR  Diet Recommendation: Cardiac diet  Discharge Diagnosis:   Principal Problem:   Acute exacerbation of CHF (congestive heart failure) ([place]) Active Problems:   Chronic combined systolic and diastolic heart failure ([place])   Essential hypertension   Acute respiratory failure with hypoxia ([place])   Hematuria   Supratherapeutic INR    History of Present Illness / Brief narrative:  LONELL STAMOS is a 86 y.o. male with PMH of HTN, HLD, A. 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  Associated with low oxygen saturation, cough. Of note, for 1 to 2 days prior to presentation, patient has also been noticing some blood in urine.  In the ED, patient was tachypneic, hypoxic to 70s, which improved with supplemental oxygen. INR elevated to 5.1. COVID-19 PCR negative.  Patient has completed vaccination. Chest x-ray showed chronic cardiomegaly with minimal subsegmental bibasilar atelectasis. IV Lasix 40 Mg x1 was given.  Patient was admitted to hospitalist service for further evaluation management.  Subjective:  Seen and examined this morning.  More sleepy this morning.  Opens eyes on verbal command. Patient's nephew at bedside.  I talked to him and patient's son Mr. [name] on the phone  Hospital Course:  Acute on chronic respiratory failure with hypoxia Acute exacerbation of chronic systolic CHF Essential  hypertension -Likely secondary to CHF exacerbation. -On PRN O2 at home.   -Oxygen saturation less than 80% on room air. Improved with supplemental oxygen.   -BNP level was elevated to more than 2000, trend as below. -Chest x-ray did not show any obvious pulmonary edema -Patient received 2 doses of IV Lasix with adequate diuresis.  Echocardiogram with a EF 25%, moderate LVH, which is same as echo from March 2021. -Euvolemic.  Renal function and BNP level improved -Home meds include Coreg 6.25 mg twice daily, Lasix 20 mg daily, BiDil 20/37.5 mg twice daily. -Patient is currently on Coreg. Resume Lasix at home.  I would keep BiDil on hold and resume only if the blood pressure is more than 150/90.  Discussed with family Recent Labs    08/30/19 2221 12/20/19 2030 12/23/19 0750  BNP 841.0* 2,059.0* 905.0*   CKD 3a -Slight worsening of renal function, not enough to call AKI.  Back to baseline Recent Labs    07/08/19 0634 07/09/19 0758 08/07/19 1200 08/30/19 2221 10/01/19 1926 12/19/19 1028 12/20/19 2058 12/21/19 0304 12/22/19 0722 12/23/19 0750  BUN 40* 42* 37* 25* 29* 24 22 22  31* 25*  CREATININE 1.10 1.17 1.69* 1.30* 1.15 1.25 1.31* 1.34* 1.34* 1.05   Hematuria history of recurrent prostatic urethra tumor s/p TURBT in July 2021 -Presented with hematuria.  Improving.  Hemoglobin level stable.   -Continue to monitor at home.  Has urology follow-up in October.   Recent Labs    07/08/19 0634 07/09/19 0758 08/07/19 1200 08/30/19 2221 10/01/19 1926 12/19/19 1028 12/20/19 2030 12/21/19 0304 12/22/19 0722 12/23/19 0750  HGB 11.6* 11.1* 12.8* 12.7* 12.3* 11.8* 11.5* 11.4* 11.4* 10.9*  Elevated troponin possible secondary to type II demand ischemia -Troponin19>21;patient denies any chest pain -EKG showed sinus rhythm at rate of 80 bpm with multiform PVCs -Continue to trend troponin level  h/o A. Fib/DVT/PE on warfarin Supratherapeutic INR -EKG currently in sinus  rhythm -INR was 5.1 on admission.  Improving, INR still remains supratherapeutic. -At discharge, continue to hold warfarin.  Discussed with patient's son and nephew.  They will be checking INR daily at home.  Can resume Coumadin once INR is between 2-2.5. --I encouraged patient and his son to discuss with primary care provider about switching him from Coumadin to NOAC for long-term. Recent Labs  Lab 12/19/19 0947 12/20/19 2058 12/21/19 0304 12/22/19 0722  INR 5.1* 4.7* 4.5* 4.0*   COPD -Continue home albuterol, Combivent  GERD Continue home Protonix  Dementia  -usually oriented to person and place. -occasional hallucination  Impaired mobility h/o peripheral vascular disease sp left AKA status -transfers OOB by himself -uses powered wheel chair at home -PT eval obtained.  Home health PT recommended  Increased sleepiness -Patient apparently been well last night.  He is sleepy this morning.  He has significant risk factors for vascular dementia and is at risk of hospital induced delirium.  Expect improvement in mental status at his usual environment at home  Stable for discharge to home today.  Wound care: Wound / Incision (Open or Dehisced) 12/10/18 Non-pressure wound Leg Right (Active)  Date First Assessed/Time First Assessed: 12/10/18 1828   Wound Type: Non-pressure wound  Location: Leg  Location Orientation: Right  Present on Admission: Yes    Assessments 12/10/2018  6:24 PM 12/26/2018  9:00 AM  Dressing Type -- Foam - Lift dressing to assess site every shift  Dressing Changed -- Reinforced  Dressing Status -- Clean;Dry;Intact  Dressing Change Frequency -- PRN  Site / Wound Assessment Clean;Bleeding --  % Wound base Red or Granulating 10% --  % Wound base Yellow/Fibrinous Exudate 90% --  % Wound base Black/Eschar 0% --  % Wound base Other/Granulation Tissue (Comment) 0% --  Peri-wound Assessment Intact --  Wound Length (cm) 3.2 cm --  Wound Width (cm) 1.6 cm --   Wound Depth (cm) 0 cm --  Wound Volume (cm^3) 0 cm^3 --  Wound Surface Area (cm^2) 5.12 cm^2 --  Tunneling (cm) 0 --  Undermining (cm) 0 --  Margins Unattached edges (unapproximated) --  Closure None --  Drainage Amount Minimal --  Drainage Description Serosanguineous --  Treatment Cleansed;Other (Comment) --     No Linked orders to display     Incision (Closed) 10/11/19 Perineum (Active)  Date First Assessed/Time First Assessed: 10/11/19 1416   Location: Perineum    Assessments 10/11/2019  3:30 PM 10/11/2019  4:10 PM  Dressing Type None None     No Linked orders to display    Discharge Exam:   Vitals:   12/22/19 1407 12/22/19 1419 12/22/19 2105 12/23/19 0519  BP:  121/64 133/68 (!) 147/70  Pulse: (!) 45 60 64 70  Resp: (!) 24 20 18 20   Temp: 98 F (36.7 C)  98.8 F (37.1 C) 97.7 F (36.5 C)  TempSrc: Oral  Oral Oral  SpO2: (!) 87% 99% 97% 95%  Weight:      Height:        Body mass index is 22.2 kg/m.  General exam: Appears calm and comfortable.  Not in distress Skin: No rashes, lesions or ulcers. HEENT: Atraumatic, normocephalic, supple neck, no obvious bleeding Lungs: Clear to  auscultation bilaterally CVS: Regular rate and rhythm, no murmur GI/Abd soft, nontender nondistended, bowel sound present CNS: Sleepy, opens eyes on verbal command, able to follow command Psychiatry: mood appropriate Extremities: Left AKA status.  Right leg without pedal edema or calf tenderness.  Follow ups:   Discharge Instructions    Diet - low sodium heart healthy   Complete by: As directed    Increase activity slowly   Complete by: As directed       Follow-up Information    Dettinger, Fransisca Kaufmann, MD Follow up.   Specialties: Family Medicine, Cardiology Contact information: [place] [address] [phone]        Minus Breeding, MD .   Specialty: Cardiology Contact information: [address] [place] [address] [phone]                Recommendations for Outpatient Follow-Up:   1. Follow-up with PCP as an outpatient 2. Follow-up with cardiology, follow with urology  Discharge Instructions:  Follow with Primary MD Dettinger, Fransisca Kaufmann, MD in 7 days   Get CBC/BMP checked in next visit within 1 week by PCP or SNF MD ( we routinely change or add medications that can affect your baseline labs and fluid status, therefore we recommend that you get the mentioned basic workup next visit with your PCP, your PCP may decide not to get them or add new tests based on their clinical decision)  On your next visit with your PCP, please Get Medicines reviewed and adjusted.  Please request your PCP  to go over all Hospital Tests and Procedure/Radiological results at the follow up, please get all Hospital records sent to your Prim MD by signing hospital release before you go home.  Activity: As tolerated with Full fall precautions use walker/cane & assistance as needed  For Heart failure patients - Check your Weight same time everyday, if you gain over 2 pounds, or you develop in leg swelling, experience more shortness of breath or chest pain, call your Primary MD immediately. Follow Cardiac Low Salt Diet and 1.5 lit/day fluid restriction.  If you have smoked or chewed Tobacco in the last 2 yrs please stop smoking, stop any regular Alcohol  and or any Recreational drug use.  If you experience worsening of your admission symptoms, develop shortness of breath, life threatening emergency, suicidal or homicidal thoughts you must seek medical attention immediately by calling 911 or calling your MD immediately  if symptoms less severe.  You Must read complete instructions/literature along with all the possible adverse reactions/side effects for all the Medicines you take and that have been prescribed to you. Take any new Medicines after you have completely understood and accpet all the possible adverse reactions/side effects.   Do not  drive, operate heavy machinery, perform activities at heights, swimming or participation in water activities or provide baby sitting services if your were admitted for syncope or siezures until you have seen by Primary MD or a Neurologist and advised to do so again.  Do not drive when taking Pain medications.  Do not take more than prescribed Pain, Sleep and Anxiety Medications  Wear Seat belts while driving.   Please note You were cared for by a hospitalist during your hospital stay. If you have any questions about your discharge medications or the care you received while you were in the hospital after you are discharged, you can call the unit and asked to speak with the hospitalist on call if  the hospitalist that took care of you is not available. Once you are discharged, your primary care physician will handle any further medical issues. Please note that NO REFILLS for any discharge medications will be authorized once you are discharged, as it is imperative that you return to your primary care physician (or establish a relationship with a primary care physician if you do not have one) for your aftercare needs so that they can reassess your need for medications and monitor your lab values.    Allergies as of 12/23/2019      Reactions   Rocephin [ceftriaxone Sodium In Dextrose] Other (See Comments)   Recurrent seizures   Lipitor [atorvastatin] Other (See Comments)   Unknown rxn per pt      Medication List    STOP taking these medications   albuterol (2.5 MG/3ML) 0.083% nebulizer solution Commonly known as: PROVENTIL   albuterol 108 (90 Base) MCG/ACT inhaler Commonly known as: VENTOLIN HFA   BiDil 20-37.5 MG tablet Generic drug: isosorbide-hydrALAZINE   PARoxetine 40 MG tablet Commonly known as: PAXIL   PROAIR HFA IN     TAKE these medications   acetaminophen 325 MG tablet Commonly known as: TYLENOL Take 2 tablets (650 mg total) by mouth every 6 (six) hours as needed for mild  pain (or Fever >/= 101).   carvedilol 6.25 MG tablet Commonly known as: COREG TAKE  (1)  TABLET TWICE A DAY.   Combivent Respimat 20-100 MCG/ACT Aers respimat Generic drug: Ipratropium-Albuterol Inhale 1 puff into the lungs every 6 (six) hours as needed for wheezing or shortness of breath (As needed for wheezing , cough or shortness of breath).   donepezil 10 MG tablet Commonly known as: ARICEPT TAKE 1 TABLET AT BEDTIME   Ensure Nutrition Shake Liqd Take 1 Bottle by mouth 4 (four) times daily. 1 bottle of chocolate flavored Ensure up to 4 times a day--give with ice-please What changed:   when to take this  reasons to take this  additional instructions   febuxostat 40 MG tablet Commonly known as: ULORIC TAKE 1 TABLET DAILY   fluticasone 50 MCG/ACT nasal spray Commonly known as: FLONASE SPRAY 1 SPRAY IN EACH NOSTRIL ONCE DAILY. What changed: See the new instructions.   furosemide 20 MG tablet Commonly known as: LASIX TAKE (1) TABLET DAILY IN THE MORNING.   gabapentin 400 MG capsule Commonly known as: NEURONTIN Take 1 capsule (400 mg total) by mouth 2 (two) times daily.   Myrbetriq 25 MG Tb24 tablet Generic drug: mirabegron ER Take 1 tablet (25 mg total) by mouth in the morning and at bedtime.   pantoprazole 40 MG tablet Commonly known as: PROTONIX TAKE 1 TABLET DAILY   sertraline 50 MG tablet Commonly known as: ZOLOFT TAKE (1) TABLET DAILY IN THE MORNING.   simvastatin 10 MG tablet Commonly known as: ZOCOR TAKE 1 TABLET ONCE DAILY IN THE EVENING What changed: See the new instructions.   warfarin 2.5 MG tablet Commonly known as: COUMADIN Take as directed. If you are unsure how to take this medication, talk to your nurse or doctor. Original instructions: Take 1 tablet (2.5 mg total) by mouth daily. Take 2.5 mg on Monday and Friday and Saturday   warfarin 5 MG tablet Commonly known as: COUMADIN Take as directed. If you are unsure how to take this  medication, talk to your nurse or doctor. Original instructions: Take 1 tablet (5 mg total) by mouth daily. Take 5 mg on Sunday Tuesday Wednesday Thursday  Time coordinating discharge: 35 minutes  The results of significant diagnostics from this hospitalization (including imaging, microbiology, ancillary and laboratory) are listed below for reference.    Procedures and Diagnostic Studies:   DG Chest Portable 1 View  Result Date: 12/20/2019 CLINICAL DATA:  Shortness of breath, weakness, cough. EXAM: PORTABLE CHEST 1 VIEW COMPARISON:  Radiograph 08/30/2019.  CT 07/04/2018 FINDINGS: Chronic cardiomegaly. Unchanged mediastinal contours. Aortic atherosclerosis. Minimal subsegmental opacities at the lung bases. No confluent consolidation. No pneumothorax or pleural effusion. No pulmonary edema. No acute osseous abnormalities are seen. IMPRESSION: Chronic cardiomegaly. Minimal subsegmental bibasilar atelectasis. Electronically Signed   By: Keith Rake M.D.   On: 12/20/2019 20:56   ECHOCARDIOGRAM COMPLETE  Result Date: 12/21/2019    ECHOCARDIOGRAM REPORT   Patient Name:   YAMA NIELSON Bakula Date of Exam: 12/21/2019 Medical Rec #:  [id number]     Height:       66.0 in Accession #:    [id number]    Weight:       151.9 lb Date of Birth:  [birth year]      BSA:          1.779 m Patient Age:    86 years      BP:           145/68 mmHg Patient Gender: M             HR:           66 bpm. Exam Location:  [facility] Procedure: 2D Echo Indications:    CHF-Acute Systolic 767.34 / L93.79  History:        Patient has prior history of Echocardiogram examinations, most                 recent 07/02/2019. CHF, Previous Myocardial Infarction and CAD,                 Arrythmias:Atrial Fibrillation, Signs/Symptoms:Syncope; Risk                 Factors:Former Smoker, Dyslipidemia and Hypertension. Alzheimer                 disease , History of Pulmonary Embolism.  Sonographer:    Leavy Cella RDCS (AE) Referring Phys: [id number]  OLADAPO ADEFESO IMPRESSIONS  1. Left ventricular ejection fraction, by estimation, is approximately 35%. The left ventricle has moderately decreased function. The left ventricle demonstrates regional wall motion abnormalities (see scoring diagram/findings for description). There is  moderate left ventricular hypertrophy. Left ventricular diastolic parameters are indeterminate.  2. Right ventricular systolic function is moderately reduced. The right ventricular size is normal. There is normal pulmonary artery systolic pressure. The estimated right ventricular systolic pressure is 53.2 mmHg.  3. Left atrial size was mild to moderately dilated.  4. The mitral valve is abnormal. Mild mitral valve regurgitation.  5. The aortic valve is tricuspid. Aortic valve regurgitation is not visualized. Mild to moderate aortic valve sclerosis/calcification is present, without any evidence of aortic stenosis.  6. The inferior vena cava is normal in size with greater than 50% respiratory variability, suggesting right atrial pressure of 3 mmHg. FINDINGS  Left Ventricle: Left ventricular ejection fraction, by estimation, is 35%. The left ventricle has moderately decreased function. The left ventricle demonstrates regional wall motion abnormalities. The left ventricular internal cavity size was normal in size. There is moderate left ventricular hypertrophy. Left ventricular diastolic parameters are indeterminate.  LV Wall Scoring: The inferior wall and basal anterolateral segment are akinetic. The  entire anterior wall, inferior septum, entire apex, and mid anterolateral segment are hypokinetic. Right Ventricle: The right ventricular size is normal. No increase in right ventricular wall thickness. Right ventricular systolic function is moderately reduced. There is normal pulmonary artery systolic pressure. The tricuspid regurgitant velocity is 2.35 m/s, and with an assumed right atrial pressure of 3 mmHg, the estimated right ventricular  systolic pressure is 10.1 mmHg. Left Atrium: Left atrial size was mild to moderately dilated. Right Atrium: Right atrial size was normal in size. Pericardium: There is no evidence of pericardial effusion. Mitral Valve: The mitral valve is abnormal. There is mild thickening of the mitral valve leaflet(s). There is mild calcification of the mitral valve leaflet(s). Mild to moderate mitral annular calcification. Mild mitral valve regurgitation. Tricuspid Valve: The tricuspid valve is grossly normal. Tricuspid valve regurgitation is mild. Aortic Valve: The aortic valve is tricuspid. There is moderate aortic valve annular calcification. Aortic valve regurgitation is not visualized. Mild to moderate aortic valve sclerosis/calcification is present, without any evidence of aortic stenosis. Pulmonic Valve: The pulmonic valve was grossly normal. Pulmonic valve regurgitation is trivial. Aorta: The aortic root is normal in size and structure. Venous: The inferior vena cava is normal in size with greater than 50% respiratory variability, suggesting right atrial pressure of 3 mmHg. IAS/Shunts: No atrial level shunt detected by color flow Doppler.  LEFT VENTRICLE PLAX 2D LVIDd:         4.53 cm  Diastology LVIDs:         3.80 cm  LV e' medial:   7.05 cm/s LV PW:         1.38 cm  LV E/e' medial: 15.2 LV IVS:        1.39 cm LVOT diam:     2.00 cm LVOT Area:     3.14 cm  RIGHT VENTRICLE RV S prime:     7.95 cm/s TAPSE (M-mode): 1.6 cm LEFT ATRIUM             Index       RIGHT ATRIUM           Index LA diam:        4.20 cm 2.36 cm/m  RA Area:     17.50 cm LA Vol (A2C):   91.0 ml 51.15 ml/m RA Volume:   49.70 ml  27.93 ml/m LA Vol (A4C):   63.3 ml 35.58 ml/m LA Biplane Vol: 77.4 ml 43.50 ml/m   AORTA Ao Root diam: 3.40 cm MITRAL VALVE                TRICUSPID VALVE MV Area (PHT): 2.60 cm     TR Peak grad:   22.1 mmHg MV Decel Time: 292 msec     TR Vmax:        235.00 cm/s MV E velocity: 107.00 cm/s MV A velocity: 145.00 cm/s   SHUNTS MV E/A ratio:  0.74         Systemic Diam: 2.00 cm Rozann Lesches MD Electronically signed by Rozann Lesches MD Signature Date/Time: 12/21/2019/5:08:34 PM    Final      Labs:   Basic Metabolic Panel: Recent Labs  Lab 12/19/19 1028 12/19/19 1028 12/20/19 2058 12/20/19 2058 12/21/19 0304 12/21/19 0304 12/22/19 0722 12/23/19 0750  NA 146*  --  142  --  145  --  144 141  K 3.8   < > 4.3   < > 3.7   < > 3.6 3.6  CL 108*  --  109  --  106  --  103 103  CO2 24  --  25  --  28  --  31 31  GLUCOSE 140*  --  109*  --  95  --  87 80  BUN 24  --  22  --  22  --  31* 25*  CREATININE 1.25  --  1.31*  --  1.34*  --  1.34* 1.05  CALCIUM 9.8  --  9.0  --  9.1  --  9.1 9.1  MG  --   --   --   --  2.1  --  2.0 2.1  PHOS  --   --   --   --  3.7  --   --   --    < > = values in this interval not displayed.   GFR Estimated Creatinine Clearance: 36.3 mL/min (by C-G formula based on SCr of 1.05 mg/dL). Liver Function Tests: Recent Labs  Lab 12/19/19 1028 12/20/19 2058 12/21/19 0304  AST 16 20 16   ALT 10 15 14   ALKPHOS 166* 126 124  BILITOT 0.5 0.8 1.1  PROT 6.4 7.2 6.6  ALBUMIN 4.1 3.9 3.7   No results for input(s): LIPASE, AMYLASE in the last 168 hours. No results for input(s): AMMONIA in the last 168 hours. Coagulation profile Recent Labs  Lab 12/19/19 0947 12/20/19 2058 12/21/19 0304 12/22/19 0722  INR 5.1* 4.7* 4.5* 4.0*    CBC: Recent Labs  Lab 12/19/19 1028 12/20/19 2030 12/21/19 0304 12/22/19 0722 12/23/19 0750  WBC 3.5 4.7 4.6 4.7 3.9*  NEUTROABS 2.1 3.3  --  3.1 2.2  HGB 11.8* 11.5* 11.4* 11.4* 10.9*  HCT 37.2* 38.3* 37.9* 37.3* 36.1*  MCV 82 88.5 87.3 87.1 88.0  PLT 187 178 169 178 176   Cardiac Enzymes: No results for input(s): CKTOTAL, CKMB, CKMBINDEX, TROPONINI in the last 168 hours. BNP: Invalid input(s): POCBNP CBG: No results for input(s): GLUCAP in the last 168 hours. D-Dimer No results for input(s): DDIMER in the last 72 hours. Hgb  A1c No results for input(s): HGBA1C in the last 72 hours. Lipid Profile No results for input(s): CHOL, HDL, LDLCALC, TRIG, CHOLHDL, LDLDIRECT in the last 72 hours. Thyroid function studies No results for input(s): TSH, T4TOTAL, T3FREE, THYROIDAB in the last 72 hours.  Invalid input(s): FREET3 Anemia work up No results for input(s): VITAMINB12, FOLATE, FERRITIN, TIBC, IRON, RETICCTPCT in the last 72 hours. Microbiology Recent Results (from the past 240 hour(s))  SARS Coronavirus 2 by RT PCR (hospital order, performed in [facility] hospital lab) Nasopharyngeal Nasopharyngeal Swab     Status: None   Collection Time: 12/20/19  8:16 PM   Specimen: Nasopharyngeal Swab  Result Value Ref Range Status   SARS Coronavirus 2 NEGATIVE NEGATIVE Final    Comment: (NOTE) SARS-CoV-2 target nucleic acids are NOT DETECTED.  The SARS-CoV-2 RNA is generally detectable in upper and lower respiratory specimens during the acute phase of infection. The lowest concentration of SARS-CoV-2 viral copies this assay can detect is 250 copies / mL. A negative result does not preclude SARS-CoV-2 infection and should not be used as the sole basis for treatment or other patient management decisions.  A negative result may occur with improper specimen collection / handling, submission of specimen other than nasopharyngeal swab, presence of viral mutation(s) within the areas targeted by this assay, and inadequate number of viral copies (<250 copies / mL). A negative result must be combined with clinical observations,  patient history, and epidemiological information.  Fact Sheet for Patients:   StrictlyIdeas.no  Fact Sheet for Healthcare Providers: BankingDealers.co.za  This test is not yet approved or  cleared by the [place] FDA and has been authorized for detection and/or diagnosis of SARS-CoV-2 by FDA under an Emergency Use Authorization (EUA).  This EUA will  remain in effect (meaning this test can be used) for the duration of the COVID-19 declaration under Section 564(b)(1) of the Act, 21 U.S.C. section 360bbb-3(b)(1), unless the authorization is terminated or revoked sooner.  Performed at [facility], [address] [place], [place] [address]      Signed: Terrilee Croak  [facility] 12/23/2019, 12:21 PM

## 2019-12-23 NOTE — Telephone Encounter (Signed)
Discussed INR results with patients son .

## 2019-12-23 NOTE — TOC Transition Note (Deleted)
Transition of Care [facility]) - CM/SW Discharge Note   Patient Details  Name: Scott [name] MRN: [id number] Date of Birth: 07/20/1921  Transition of Care [facility]) CM/SW Contact:  Natasha Bence, LCSW Phone Number: 12/23/2019, 1:27 PM   Clinical Narrative:    CSW contacted patient for to inquire about HHPT. Patient agreeable to HHPT. CSW placed HHPT with [facility] and notified patient's son who is patient's caregiver of referral. Patient's son also agreeable to HHPT for patient. Patient's son reported that patient currently has oxygen in the home. TOC signing off.         Patient Goals and CMS Choice        Discharge Placement                       Discharge Plan and Services                                     Social Determinants of Health (SDOH) Interventions     Readmission Risk Interventions Readmission Risk Prevention Plan 07/09/2019 07/02/2019 12/11/2018  Transportation Screening Complete Complete Complete  PCP or Specialist Appt within 3-5 Days - - -  HRI or [place] or Home Care Consult comments - - -  Social Work Consult for [place] - - -  Medication Review Press photographer) Complete Complete Complete  PCP or Specialist appointment within 3-5 days of discharge Complete Not Complete Complete  HRI or Home Care Consult Complete Complete Not Complete  HRI or Home Care Consult Pt Refusal Comments - - not ready  SW Recovery Care/Counseling Consult Complete Complete Complete  Palliative Care Screening Complete Not Applicable Not Applicable  Skilled Nursing Facility Complete Not Complete Not Complete  SNF Comments - - 24/7 care  Some recent data might be hidden

## 2019-12-23 NOTE — TOC Transition Note (Signed)
Transition of Care [facility]) - CM/SW Discharge Note   Patient Details  Name: Scott [name] MRN: [id number] Date of Birth: Sep 23, 1921  Transition of Care [facility]) CM/SW Contact:  Natasha Bence, LCSW Phone Number: 12/23/2019, 1:33 PM   Clinical Narrative:    CSW contacted patient for to inquire about HHPT. Patient agreeable to HHPT. CSW placed HHPT with [facility] and notified patient's son who is patient's caregiver of referral. Patient's son also agreeable to HHPT for patient. Patient's son reported that patient currently has oxygen in the home. TOC signing off.CSW contacted patient for to inquire about HHPT. Patient agreeable to HHPT. CSW placed HHPT with [facility] and notified patient's son who is patient's caregiver of referral. Patient's son also agreeable to HHPT for patient. Patient's son reported that patient currently has oxygen in the home. TOC signing off.   Final next level of care: [place] Barriers to Discharge: Barriers Resolved   Patient Goals and CMS Choice Patient states their goals for this hospitalization and ongoing recovery are:: Return home with [facility]   Choice offered to / list presented to : Patient, Adult Children  Discharge Placement                  Name of family member notified: [phone] Patient and family notified of of transfer: 12/23/19  Discharge Plan and Services                            [place]: [place] ([place]) Date [place]: 12/22/19 Time [place]: [id number] Representative spoke with at [place]: [place] ([place]) Interventions     Readmission Risk Interventions Readmission Risk Prevention Plan 12/23/2019 07/09/2019 07/02/2019  Transportation Screening Complete Complete Complete  PCP or Specialist Appt within 3-5 Days - - -  HRI or [place] or Home Care Consult comments - - -  Social Work Scientific laboratory technician for [place] Planning/Counseling  - - -  [place] - - -  Medication Review Press photographer) Complete Complete Complete  PCP or Specialist appointment within 3-5 days of discharge Complete Complete Not Complete  HRI or Home Care Consult Complete Complete Complete  [place] or Home Care Consult Pt Refusal Comments - - -  SW Recovery Care/Counseling Consult Complete Complete Complete  Palliative Care Screening Not Applicable Complete Not Applicable  Skilled Nursing Facility Not Applicable Complete Not Complete  SNF Comments - - -  Some recent data might be hidden

## 2019-12-23 NOTE — Progress Notes (Signed)
Patient's caregiver/nephew states understanding of discharge instructions

## 2019-12-24 ENCOUNTER — Telehealth: Payer: Self-pay

## 2019-12-24 ENCOUNTER — Telehealth: Payer: Self-pay | Admitting: *Deleted

## 2019-12-24 DIAGNOSIS — K219 Gastro-esophageal reflux disease without esophagitis: Secondary | ICD-10-CM | POA: Diagnosis not present

## 2019-12-24 DIAGNOSIS — N1831 Chronic kidney disease, stage 3a: Secondary | ICD-10-CM | POA: Diagnosis not present

## 2019-12-24 DIAGNOSIS — J449 Chronic obstructive pulmonary disease, unspecified: Secondary | ICD-10-CM | POA: Diagnosis not present

## 2019-12-24 DIAGNOSIS — Z9981 Dependence on supplemental oxygen: Secondary | ICD-10-CM | POA: Diagnosis not present

## 2019-12-24 DIAGNOSIS — Z7951 Long term (current) use of inhaled steroids: Secondary | ICD-10-CM | POA: Diagnosis not present

## 2019-12-24 DIAGNOSIS — I130 Hypertensive heart and chronic kidney disease with heart failure and stage 1 through stage 4 chronic kidney disease, or unspecified chronic kidney disease: Secondary | ICD-10-CM | POA: Diagnosis not present

## 2019-12-24 DIAGNOSIS — Z89612 Acquired absence of left leg above knee: Secondary | ICD-10-CM | POA: Diagnosis not present

## 2019-12-24 DIAGNOSIS — I509 Heart failure, unspecified: Secondary | ICD-10-CM | POA: Diagnosis not present

## 2019-12-24 DIAGNOSIS — I5042 Chronic combined systolic (congestive) and diastolic (congestive) heart failure: Secondary | ICD-10-CM | POA: Diagnosis not present

## 2019-12-24 DIAGNOSIS — Z86718 Personal history of other venous thrombosis and embolism: Secondary | ICD-10-CM | POA: Diagnosis not present

## 2019-12-24 DIAGNOSIS — I739 Peripheral vascular disease, unspecified: Secondary | ICD-10-CM | POA: Diagnosis not present

## 2019-12-24 DIAGNOSIS — I4891 Unspecified atrial fibrillation: Secondary | ICD-10-CM | POA: Diagnosis not present

## 2019-12-24 DIAGNOSIS — Z8551 Personal history of malignant neoplasm of bladder: Secondary | ICD-10-CM | POA: Diagnosis not present

## 2019-12-24 DIAGNOSIS — J9621 Acute and chronic respiratory failure with hypoxia: Secondary | ICD-10-CM | POA: Diagnosis not present

## 2019-12-24 DIAGNOSIS — N400 Enlarged prostate without lower urinary tract symptoms: Secondary | ICD-10-CM | POA: Diagnosis not present

## 2019-12-24 DIAGNOSIS — M1990 Unspecified osteoarthritis, unspecified site: Secondary | ICD-10-CM | POA: Diagnosis not present

## 2019-12-24 DIAGNOSIS — R319 Hematuria, unspecified: Secondary | ICD-10-CM | POA: Diagnosis not present

## 2019-12-24 DIAGNOSIS — F0390 Unspecified dementia without behavioral disturbance: Secondary | ICD-10-CM | POA: Diagnosis not present

## 2019-12-24 DIAGNOSIS — F329 Major depressive disorder, single episode, unspecified: Secondary | ICD-10-CM | POA: Diagnosis not present

## 2019-12-24 DIAGNOSIS — Z86711 Personal history of pulmonary embolism: Secondary | ICD-10-CM | POA: Diagnosis not present

## 2019-12-24 DIAGNOSIS — Z9181 History of falling: Secondary | ICD-10-CM | POA: Diagnosis not present

## 2019-12-24 DIAGNOSIS — E785 Hyperlipidemia, unspecified: Secondary | ICD-10-CM | POA: Diagnosis not present

## 2019-12-24 DIAGNOSIS — I429 Cardiomyopathy, unspecified: Secondary | ICD-10-CM | POA: Diagnosis not present

## 2019-12-24 DIAGNOSIS — I5021 Acute systolic (congestive) heart failure: Secondary | ICD-10-CM | POA: Diagnosis not present

## 2019-12-24 NOTE — ED Provider Notes (Signed)
[facility] Provider Note   CSN: [id number] Arrival date & time: 12/20/19  1952     History Chief Complaint  Patient presents with  . Congestive Heart Failure    Scott [name] is a 86 y.o. male.  HPI Level 5 caveat due to dementia. Patient presents with shortness of breath.  Has been feeling worse over the last few days.  Son states saw PCP yesterday and increased his Lasix.  Also reportedly patient is seeing things are not better and has had blood in his urine.  Patient had sats in the 80s at home sats of 77 upon arrival here.  Has had a cough with no real sputum production.  History of CHF and COPD.  Has increased swelling in his leg.  Patient is on Coumadin for apparently A. fib and pulmonary embolism. Patient has had his Covid vaccine.    Past Medical History:  Diagnosis Date  . Arthritis    "all over"  . Atrial fibrillation ([name])   . Benign localized prostatic hyperplasia with lower urinary tract symptoms (LUTS)   . Bradycardia 11/28/2015   Severe-HR in the 20s to 30s following intubation for urological procedure.  Marland Kitchen CAD- non obstructive disease by cath 3/14 cardiologist-  dr hochrein  . Chronic lower back pain   . COPD (chronic obstructive pulmonary disease) ([place])   . DDD (degenerative disc disease)   . Depression   . Dyspnea    w/ exertion and lying down (raise head)  . ETOH abuse   . GERD (gastroesophageal reflux disease)   . Glaucoma   . Glaucoma, both eyes   . History of bladder cancer urologist-- dr Jeffie Pollock   first dx 2012--  s/p TURBT's  . History of DVT of lower extremity 03/2013   bilateral   . History of pulmonary embolus (PE) 04/2013  . HTN (hypertension)   . Hyperlipidemia   . Hypertension   . LBBB (left bundle branch block)    chronic  . Non-ischemic cardiomyopathy ([place])    last echo 01-18-2017, ef 35-40%  . NSVT (nonsustained ventricular tachycardia) ([place])    a. NSVT 06/2012; NSVT also seen during 03/2013 adm. b. Med rx. Not  candidate for ICD given adv age.  Marland Kitchen PAD (peripheral artery disease) ([place])   . 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 PVCs (premature ventricular contractions)   . PVD (peripheral vascular disease) (HCC)    Right ABI .75, Left .78 (2006)  . Syncope 06/2012   a. Felt to be postural syncope related to diuretics 06/2012.  Marland Kitchen Systolic and diastolic CHF, chronic (HCC) cardiologsit-  dr hochrein   a. NICM - patent cors 06/2012, EF 40% at that time. b. 03/2013 eval: EF 20-25%.  . Vision loss, left eye    due to glaucoma  . Wears glasses     Patient Active Problem List   Diagnosis Date Noted  . Hematuria 12/21/2019  . Supratherapeutic INR 12/21/2019  . Acute exacerbation of CHF (congestive heart failure) ([place]) 12/20/2019  . 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 Acute respiratory failure with hypoxia ([place])   . Alzheimer disease ([place]) 09/07/2018  . Paroxysmal atrial fibrillation ([place]) 01/18/2017  . PVC (pulmonary venous congestion) 01/18/2017  . Thoracic aortic atherosclerosis ([place]) 11/26/2016  . Thrombocytopenia ([place]) 08/04/2016  . Essential hypertension 11/29/2015  . Bradycardia 11/28/2015  . Malignant neoplasm of lateral wall of urinary bladder (  [place]) 07/10/2015  . Status post above-knee amputation of left lower extremity ([place]) 10/18/2014  . Tachyarrhythmia 10/05/2014  . Atherosclerotic PVD with ulceration ([place]) 10/03/2014  . PAD (peripheral artery disease) ([place]) 09/24/2014  . History of non-ST elevation myocardial infarction (NSTEMI) 10/09/2013  . High risk medication use 05/17/2013  . Vitamin D deficiency 05/17/2013  . History of pulmonary embolism (2014) 04/17/2013  . NSVT (nonsustained ventricular tachycardia)- not an ICD candidate 04/17/2013  . NICM (nonischemic cardiomyopathy)- EF 20-25% by Echo 04/12/13 04/17/2013  . Chronic combined systolic and diastolic heart failure ([place])  03/15/2013  . DVT of lower extremity, bilateral ([place]) 03/15/2013  . Constipation 11/02/2012  . Syncope 06/21/2012  . Benign localized hyperplasia of prostate with urinary obstruction 03/15/2012  . Hyperlipidemia   . PVCs (premature ventricular contractions)   . CAD- non obstructive disease by cath 3/14     Past Surgical History:  Procedure Laterality Date  . AMPUTATION Left 10/16/2014   Procedure: AMPUTATION ABOVE KNEE- LEFT;  Surgeon: Mal Misty, MD;  Location: [facility];  Service: Vascular;  Laterality: Left;  . CARDIAC CATHETERIZATION  05-11-2004  DR [facility]   MINIMAL CORANARY PLAQUE/ NORMAL LVF/ EF 55%/  NON-OBSTRUCTIVE LAD 25%  . CARDIAC CATHETERIZATION  06/18/2012   dr hochrein   mild luminal irregularities of coronaries, ef 45-50%  . CARDIOVASCULAR STRESS TEST  10-15-2010   LOW RISK NUCLEAR STUDY/ NO EVIDENCE OF ISCHEMIA/ NORMAL EF  . CATARACT EXTRACTION W/ INTRAOCULAR LENS  IMPLANT, BILATERAL Bilateral   . CYSTOSCOPY  10/07/2011   Procedure: CYSTOSCOPY;  Surgeon: Malka So, MD;  Location: [facility];  Service: Urology;  Laterality: N/A;  . CYSTOSCOPY N/A 10/20/2018   Procedure: CYSTOSCOPY WITH FULGURATION OF PROSTATIC URETHRAL TUMOR;  Surgeon: [name], MD;  Location: [facility];  Service: Urology;  Laterality: N/A;  . CYSTOSCOPY N/A 10/11/2019   Procedure: CYSTOSCOPY;  Surgeon: Cleon Gustin, MD;  Location: [facility];  Service: Urology;  Laterality: N/A;  . CYSTOSCOPY WITH BIOPSY  03/14/2012   Procedure: CYSTOSCOPY WITH BIOPSY;  Surgeon: Malka So, MD;  Location: [facility];  Service: Urology;  Laterality: N/A;  WITH FULGURATION  . CYSTOSCOPY WITH BIOPSY N/A 12/09/2016   Procedure: CYSTOSCOPY WITH BIOPSY AND FULGURATION;  Surgeon: [name], MD;  Location: [facility];  Service: Urology;  Laterality: N/A;  . CYSTOSCOPY WITH BIOPSY N/A 12/20/2017   Procedure: CYSTOSCOPY WITH BIOPSY WITH FULGURATION;  Surgeon: [name], MD;  [facility];  Service: Urology;   Laterality: N/A;  . CYSTOSCOPY WITH BIOPSY N/A 06/09/2018   Procedure: CYSTOSCOPY WITH BIOPSY AND FULGURATION;  Surgeon: [name], MD;  [facility];  Service: Urology;  Laterality: N/A;  . CYSTOSCOPY WITH FULGERATION N/A 01/20/2016   Procedure: CYSTOSCOPY, BIOPSY WITH FULGERATION OF URTHRAL TUMOR;  Surgeon: [name], MD;  [facility];  Service: Urology;  Laterality: N/A;  . CYSTOSCOPY WITH FULGERATION N/A 06/01/2016   Procedure: CYSTOSCOPY WITH FULGERATION urethral tumors and bladder neck;  Surgeon: [name], MD;  [facility];  Service: Urology;  Laterality: N/A;  . SHOULDER SURGERY Left 1970's  . TONSILLECTOMY    . TRANSTHORACIC ECHOCARDIOGRAM  01-18-2017   dr hochrein   moderate LVH, ef 35-40%, diffuse hypokinesis/  trivial AR and MR/  mild TR  . TRANSURETHRAL RESECTION OF BLADDER TUMOR  10/07/2011   Procedure: TRANSURETHRAL RESECTION OF BLADDER TUMOR (TURBT);  Surgeon: Malka So, MD;  Location: [facility];  Service: Urology;  Laterality:  N/A;  . TRANSURETHRAL RESECTION OF BLADDER TUMOR N/A 07/10/2015   Procedure: TRANSURETHRAL RESECTION OF BLADDER TUMOR (TURBT), CYSTOSCOPY ;  Surgeon: [name], MD;  [facility];  Service: Urology;  Laterality: N/A;  . TRANSURETHRAL RESECTION OF BLADDER TUMOR N/A 10/11/2019   Procedure: TRANSURETHRAL RESECTION OF PROSTATIC URETHRAL TUMOR;  Surgeon: Cleon Gustin, MD;  Location: [facility];  Service: Urology;  Laterality: N/A;  . TRANSURETHRAL RESECTION OF PROSTATE  03/14/2012   Procedure: TRANSURETHRAL RESECTION OF THE PROSTATE WITH GYRUS INSTRUMENTS;  Surgeon: Malka So, MD;  [facility];  Service: Urology;  Laterality: N/A;       Family History  Problem Relation Age of Onset  . Hypertension Mother   . Arthritis Mother   . Lung cancer Sister   . Deep vein thrombosis Father   . Early death Brother     Social History   Tobacco Use  . Smoking status: Former Smoker    Packs/day: 1.00    Years:  35.00    Pack years: 35.00    Types: Cigarettes    Start date: 04/12/1942    Quit date: 04/13/1975    Years since quitting: 44.7  . Smokeless tobacco: Never Used  Vaping Use  . Vaping Use: Never used  Substance Use Topics  . Alcohol use: Not Currently    Alcohol/week: 7.0 standard drinks    Types: 7 Cans of beer per week  . Drug use: No    Home Medications Prior to Admission medications   Medication Sig Start Date End Date Taking? Authorizing Provider  acetaminophen (TYLENOL) 325 MG tablet Take 2 tablets (650 mg total) by mouth every 6 (six) hours as needed for mild pain (or Fever >/= 101). 12/26/18  Yes Elgergawy, Silver Huguenin, MD  carvedilol (COREG) 6.25 MG tablet TAKE  (1)  TABLET TWICE A DAY. 11/27/19  Yes Dettinger, Fransisca Kaufmann, MD  donepezil (ARICEPT) 10 MG tablet TAKE 1 TABLET AT BEDTIME 11/27/19  Yes Dettinger, Fransisca Kaufmann, MD  febuxostat (ULORIC) 40 MG tablet TAKE 1 TABLET DAILY 12/13/19  Yes Dettinger, Fransisca Kaufmann, MD  fluticasone (FLONASE) 50 MCG/ACT nasal spray SPRAY 1 SPRAY IN EACH NOSTRIL ONCE DAILY. Patient taking differently: Place 1 spray into both nostrils daily as needed for allergies or rhinitis.  08/29/19  Yes Dettinger, Fransisca Kaufmann, MD  furosemide (LASIX) 20 MG tablet TAKE (1) TABLET DAILY IN THE MORNING. 11/13/19  Yes Dettinger, Fransisca Kaufmann, MD  gabapentin (NEURONTIN) 400 MG capsule Take 1 capsule (400 mg total) by mouth 2 (two) times daily. 07/09/19  Yes Emokpae, Courage, MD  MYRBETRIQ 25 MG TB24 tablet Take 1 tablet (25 mg total) by mouth in the morning and at bedtime. 11/05/19  Yes McKenzie, Candee Furbish, MD  Nutritional Supplements (ENSURE NUTRITION SHAKE) LIQD Take 1 Bottle by mouth 4 (four) times daily. 1 bottle of chocolate flavored Ensure up to 4 times a day--give with ice-please Patient taking differently: Take 1 Bottle by mouth daily as needed (When he does not eat well).  07/09/19  Yes Emokpae, Courage, MD  pantoprazole (PROTONIX) 40 MG tablet TAKE 1 TABLET DAILY 12/06/19  Yes Dettinger,  Fransisca Kaufmann, MD  sertraline (ZOLOFT) 50 MG tablet TAKE (1) TABLET DAILY IN THE MORNING. 12/12/19  Yes Dettinger, Fransisca Kaufmann, MD  simvastatin (ZOCOR) 10 MG tablet TAKE 1 TABLET ONCE DAILY IN THE EVENING Patient taking differently: Take 10 mg by mouth daily.  08/13/19  Yes Dettinger, Fransisca Kaufmann, MD  warfarin (COUMADIN) 2.5 MG tablet  Take 1 tablet (2.5 mg total) by mouth daily. Take 2.5 mg on Monday and Friday and Saturday 12/19/19  Yes Dettinger, Fransisca Kaufmann, MD  warfarin (COUMADIN) 5 MG tablet Take 1 tablet (5 mg total) by mouth daily. Take 5 mg on Sunday Tuesday Wednesday Thursday 12/19/19  Yes Dettinger, Fransisca Kaufmann, MD  Ipratropium-Albuterol (COMBIVENT RESPIMAT) 20-100 MCG/ACT AERS respimat Inhale 1 puff into the lungs every 6 (six) hours as needed for wheezing or shortness of breath (As needed for wheezing , cough or shortness of breath). Patient not taking: Reported on 12/21/2019 07/09/19   Roxan Hockey, MD    Allergies    Rocephin [ceftriaxone sodium in dextrose] and Lipitor [atorvastatin]  Review of Systems   Review of Systems  Constitutional: Positive for appetite change.  HENT: Negative for congestion.   Respiratory: Positive for cough and shortness of breath.   Gastrointestinal: Negative for abdominal pain.  Genitourinary: Negative for flank pain.  Musculoskeletal: Negative for back pain.  Skin: Negative for rash.  Neurological: Positive for weakness.  Psychiatric/Behavioral: Negative for confusion.    Physical Exam Updated Vital Signs BP (!) 147/70 (BP Location: Right Arm)   Pulse 70   Temp 97.7 F (36.5 C) (Oral)   Resp 20   Ht 5\' 6"  (1.676 m)   Wt 62.4 kg   SpO2 95%   BMI 22.20 kg/m   Physical Exam Vitals and nursing note reviewed.  HENT:     Head: Normocephalic.  Eyes:     General:        Right eye: No discharge.  Cardiovascular:     Rate and Rhythm: Normal rate.  Pulmonary:     Breath sounds: Rales present.     Comments: Bilateral rales, but worse on left. Abdominal:      Tenderness: There is no abdominal tenderness.  Musculoskeletal:     Right lower leg: Edema present.     Left lower leg: Edema present.  Skin:    General: Skin is warm.     Capillary Refill: Capillary refill takes less than 2 seconds.  Neurological:     Mental Status: He is alert and oriented to person, place, and time.     ED Results / Procedures / Treatments   Labs (all labs ordered are listed, but only abnormal results are displayed) Labs Reviewed  COMPREHENSIVE METABOLIC PANEL - Abnormal; Notable for the following components:      Result Value   Glucose, Bld 109 (*)    Creatinine, Ser 1.31 (*)    GFR calc non Af Amer 46 (*)    GFR calc Af Amer 53 (*)    All other components within normal limits  BRAIN NATRIURETIC PEPTIDE - Abnormal; Notable for the following components:   B Natriuretic Peptide 2,059.0 (*)    All other components within normal limits  CBC WITH DIFFERENTIAL/PLATELET - Abnormal; Notable for the following components:   Hemoglobin 11.5 (*)    HCT 38.3 (*)    RDW 16.2 (*)    All other components within normal limits  PROTIME-INR - Abnormal; Notable for the following components:   Prothrombin Time 43.0 (*)    INR 4.7 (*)    All other components within normal limits  COMPREHENSIVE METABOLIC PANEL - Abnormal; Notable for the following components:   Creatinine, Ser 1.34 (*)    GFR calc non Af Amer 44 (*)    GFR calc Af Amer 51 (*)    All other components within normal limits  CBC -  Abnormal; Notable for the following components:   Hemoglobin 11.4 (*)    HCT 37.9 (*)    RDW 16.0 (*)    All other components within normal limits  PROTIME-INR - Abnormal; Notable for the following components:   Prothrombin Time 41.1 (*)    INR 4.5 (*)    All other components within normal limits  URINALYSIS, ROUTINE W REFLEX MICROSCOPIC - Abnormal; Notable for the following components:   Color, Urine AMBER (*)    APPearance CLOUDY (*)    Hgb urine dipstick MODERATE (*)     Protein, ur 100 (*)    RBC / HPF >50 (*)    WBC, UA >50 (*)    All other components within normal limits  CBC WITH DIFFERENTIAL/PLATELET - Abnormal; Notable for the following components:   Hemoglobin 11.4 (*)    HCT 37.3 (*)    RDW 15.9 (*)    All other components within normal limits  BASIC METABOLIC PANEL - Abnormal; Notable for the following components:   BUN 31 (*)    Creatinine, Ser 1.34 (*)    GFR calc non Af Amer 44 (*)    GFR calc Af Amer 51 (*)    All other components within normal limits  PROTIME-INR - Abnormal; Notable for the following components:   Prothrombin Time 37.5 (*)    INR 4.0 (*)    All other components within normal limits  BRAIN NATRIURETIC PEPTIDE - Abnormal; Notable for the following components:   B Natriuretic Peptide 905.0 (*)    All other components within normal limits  BASIC METABOLIC PANEL - Abnormal; Notable for the following components:   BUN 25 (*)    GFR calc non Af Amer 60 (*)    All other components within normal limits  CBC WITH DIFFERENTIAL/PLATELET - Abnormal; Notable for the following components:   WBC 3.9 (*)    RBC 4.10 (*)    Hemoglobin 10.9 (*)    HCT 36.1 (*)    RDW 15.9 (*)    All other components within normal limits  TROPONIN I (HIGH SENSITIVITY) - Abnormal; Notable for the following components:   Troponin I (High Sensitivity) 19 (*)    All other components within normal limits  TROPONIN I (HIGH SENSITIVITY) - Abnormal; Notable for the following components:   Troponin I (High Sensitivity) 21 (*)    All other components within normal limits  TROPONIN I (HIGH SENSITIVITY) - Abnormal; Notable for the following components:   Troponin I (High Sensitivity) 20 (*)    All other components within normal limits  TROPONIN I (HIGH SENSITIVITY) - Abnormal; Notable for the following components:   Troponin I (High Sensitivity) 22 (*)    All other components within normal limits  SARS CORONAVIRUS 2 BY RT PCR [facility] ORDER, [facility])  MAGNESIUM  PHOSPHORUS  MAGNESIUM  MAGNESIUM    EKG EKG Interpretation  Date/Time:  Thursday December 20 2019 20:11:08 EDT Ventricular Rate:  80 PR Interval:    QRS Duration: 149 QT Interval:  415 QTC Calculation: 461 R Axis:   -44 Text Interpretation: Sinus rhythm Multiform ventricular premature complexes Prolonged PR interval Consider left atrial enlargement Left bundle branch block Confirmed by Davonna Belling [phone]) on 12/20/2019 8:30:03 PM   Radiology No results found.  Procedures Procedures (including critical care time)  Medications Ordered in ED Medications  furosemide (LASIX) injection 40 mg (40 mg Intravenous Given 12/20/19 2209)  ALPRAZolam (XANAX) tablet 0.25 mg (  0.25 mg Oral Given 12/21/19 2219)    ED Course  I have reviewed the triage vital signs and the nursing notes.  Pertinent labs & imaging results that were available during my care of the patient were reviewed by me and considered in my medical decision making (see chart for details).    MDM Rules/Calculators/A&P                          Patient presents with shortness of breath.  Has had for last few days.  PCP had increased Lasix however comes in hypoxic with room air sats of 77%.  Rales on exam but worse on the left.  X-ray actually reassuring but BNP is elevated.  Troponin mildly elevated also.  I think this is related to demand.  EKG reassuring.  IV Lasix given.  With new oxygen requirement will admit to hospitalist however.  CRITICAL CARE Performed by: Davonna Belling Total critical care time: 30 minutes Critical care time was exclusive of separately billable procedures and treating other patients. Critical care was necessary to treat or prevent imminent or life-threatening deterioration. Critical care was time spent personally by me on the following activities: development of treatment plan with patient and/or surrogate as well as nursing, discussions with  consultants, evaluation of patient's response to treatment, examination of patient, obtaining history from patient or surrogate, ordering and performing treatments and interventions, ordering and review of laboratory studies, ordering and review of radiographic studies, pulse oximetry and re-evaluation of patient's condition. Final Clinical Impression(s) / ED Diagnoses Final diagnoses:  Acute on chronic congestive heart failure, unspecified heart failure type [facility])    Rx / DC Orders ED Discharge Orders         Ordered    Increase activity slowly        12/23/19 1221    Diet - low sodium heart healthy        12/23/19 1221           Davonna Belling, MD 12/24/19 [phone]

## 2019-12-24 NOTE — Telephone Encounter (Signed)
TRANSITIONAL CARE MANAGEMENT TELEPHONE OUTREACH NOTE   Contact Date: 12/24/2019 Contacted By: Nigel Berthold   DISCHARGE INFORMATION Date of Discharge:12/23/19 Discharge Facility: [facility]  Principal Discharge Diagnosis:Heart Failure    Outpatient Follow Up Recommendations   1. Follow-up with PCP as an outpatient 2. Follow-up with cardiology, follow with urology  Scott [name] is a male primary care patient of Dettinger, Fransisca Kaufmann, MD. An outgoing telephone call was made today and I spoke with Scott [name]- nephew.  Scott [name] condition(s) and treatment(s) were discussed. An opportunity to ask questions was provided and all were answered or forwarded as appropriate.    ACTIVITIES OF DAILY LIVING  Scott [name] lives alone and he can not perform ADLs independently. his primary caregiver is son/ caretakers. he is able to depend on his primary caregiver(s) for consistent help. Transportation to appointments, to pick up medications, and to run errands is not a problem.  (Consider referral to [facility] if transportation or a consistent caregiver is a problem)   Fall Risk Fall Risk  08/07/2019 09/07/2018  Falls in the past year? 0 0  Number falls in past yr: - -  Injury with Fall? - -  Risk Factor Category  - -  Risk for fall due to : - -    medium [place] Modifications/Assistive Devices Wheelchair: Yes Cane: No Ramp: Yes Bedside Toilet: yes Hospital Bed:  Yes    [place] he is not receiving home health.    MEDICATION RECONCILIATION  Scott [name] has been able to pick-up all prescribed discharge medications from the pharmacy.   A post discharge medication reconciliation was performed and the complete medication list was reviewed with the patient/caregiver and is current as of 12/24/2019. Changes highlighted below.  Discontinued Medications STOP taking these medications   albuterol (2.5 MG/3ML) 0.083% nebulizer solution Commonly known as:  PROVENTIL   albuterol 108 (90 Base) MCG/ACT inhaler Commonly known as: VENTOLIN HFA   BiDil 20-37.5 MG tablet Generic drug: isosorbide-hydrALAZINE   PARoxetine 40 MG tablet Commonly known as: PAXIL   PROAIR HFA IN     Current Medication List Allergies as of 12/24/2019      Reactions   Rocephin [ceftriaxone Sodium In Dextrose] Other (See Comments)   Recurrent seizures   Lipitor [atorvastatin] Other (See Comments)   Unknown rxn per pt      Medication List       Accurate as of December 24, 2019  9:27 AM. If you have any questions, ask your nurse or doctor.        acetaminophen 325 MG tablet Commonly known as: TYLENOL Take 2 tablets (650 mg total) by mouth every 6 (six) hours as needed for mild pain (or Fever >/= 101).   carvedilol 6.25 MG tablet Commonly known as: COREG TAKE  (1)  TABLET TWICE A DAY.   Combivent Respimat 20-100 MCG/ACT Aers respimat Generic drug: Ipratropium-Albuterol Inhale 1 puff into the lungs every 6 (six) hours as needed for wheezing or shortness of breath (As needed for wheezing , cough or shortness of breath).   donepezil 10 MG tablet Commonly known as: ARICEPT TAKE 1 TABLET AT BEDTIME   Ensure Nutrition Shake Liqd Take 1 Bottle by mouth 4 (four) times daily. 1 bottle of chocolate flavored Ensure up to 4 times a day--give with ice-please What changed:   when to take this  reasons to take this  additional instructions   febuxostat 40 MG tablet Commonly  known as: ULORIC TAKE 1 TABLET DAILY   fluticasone 50 MCG/ACT nasal spray Commonly known as: FLONASE SPRAY 1 SPRAY IN EACH NOSTRIL ONCE DAILY. What changed: See the new instructions.   furosemide 20 MG tablet Commonly known as: LASIX TAKE (1) TABLET DAILY IN THE MORNING.   gabapentin 400 MG capsule Commonly known as: NEURONTIN Take 1 capsule (400 mg total) by mouth 2 (two) times daily.   Myrbetriq 25 MG Tb24 tablet Generic drug: mirabegron ER Take 1 tablet (25 mg total)  by mouth in the morning and at bedtime.   pantoprazole 40 MG tablet Commonly known as: PROTONIX TAKE 1 TABLET DAILY   sertraline 50 MG tablet Commonly known as: ZOLOFT TAKE (1) TABLET DAILY IN THE MORNING.   simvastatin 10 MG tablet Commonly known as: ZOCOR TAKE 1 TABLET ONCE DAILY IN THE EVENING What changed: See the new instructions.   warfarin 2.5 MG tablet Commonly known as: COUMADIN Take as directed by the anticoagulation clinic. If you are unsure how to take this medication, talk to your nurse or doctor. Original instructions: Take 1 tablet (2.5 mg total) by mouth daily. Take 2.5 mg on Monday and Friday and Saturday   warfarin 5 MG tablet Commonly known as: COUMADIN Take as directed by the anticoagulation clinic. If you are unsure how to take this medication, talk to your nurse or doctor. Original instructions: Take 1 tablet (5 mg total) by mouth daily. Take 5 mg on Sunday Tuesday Wednesday Thursday        PATIENT EDUCATION & FOLLOW-UP PLAN  An appointment for Transitional Care Management is scheduled with Dettinger, Fransisca Kaufmann, MD on 12/31/19 at 3:55pm.  Take all medications as prescribed  Contact our office by calling [phone] if you have any questions or concerns

## 2019-12-24 NOTE — Telephone Encounter (Signed)
TC from [place] w/ [facility] She did INR today, he was just admitted to their services from hospital w/ CHF Todays INR was 2.5, MMM received call yesterday afternoon from [facility] INR yesterday at 741 pm was 1.3 When would you like this repeated this week

## 2019-12-25 ENCOUNTER — Other Ambulatory Visit: Payer: Self-pay | Admitting: Family Medicine

## 2019-12-25 NOTE — Telephone Encounter (Signed)
Yes go ahead and have them repeat his INR on Friday this week, maybe this will help [place] decipher the inconsistencies.

## 2019-12-25 NOTE — Telephone Encounter (Signed)
Scott [name] w/ [facility] aware to repeat patient's INR on Friday 9/17

## 2019-12-27 ENCOUNTER — Telehealth: Payer: Self-pay | Admitting: *Deleted

## 2019-12-27 DIAGNOSIS — J449 Chronic obstructive pulmonary disease, unspecified: Secondary | ICD-10-CM | POA: Diagnosis not present

## 2019-12-27 DIAGNOSIS — I130 Hypertensive heart and chronic kidney disease with heart failure and stage 1 through stage 4 chronic kidney disease, or unspecified chronic kidney disease: Secondary | ICD-10-CM | POA: Diagnosis not present

## 2019-12-27 DIAGNOSIS — Z86711 Personal history of pulmonary embolism: Secondary | ICD-10-CM

## 2019-12-27 DIAGNOSIS — I5021 Acute systolic (congestive) heart failure: Secondary | ICD-10-CM | POA: Diagnosis not present

## 2019-12-27 DIAGNOSIS — N1831 Chronic kidney disease, stage 3a: Secondary | ICD-10-CM | POA: Diagnosis not present

## 2019-12-27 DIAGNOSIS — J9621 Acute and chronic respiratory failure with hypoxia: Secondary | ICD-10-CM | POA: Diagnosis not present

## 2019-12-27 DIAGNOSIS — I5042 Chronic combined systolic (congestive) and diastolic (congestive) heart failure: Secondary | ICD-10-CM | POA: Diagnosis not present

## 2019-12-27 NOTE — Telephone Encounter (Signed)
VM from Scott [name] w/ [facility] Pt's INR 3.1 PT 37.3 today

## 2019-12-28 ENCOUNTER — Other Ambulatory Visit: Payer: Self-pay | Admitting: Family Medicine

## 2019-12-28 NOTE — Telephone Encounter (Signed)
Patient aware and verbalized understanding. °

## 2019-12-28 NOTE — Telephone Encounter (Signed)
Description   Hold today and then resume 2 2.5mg  tablets daily except only take 1 2-1/2 mg tablets on Mondays and Fridays 3.1 today, INR goal 2.0-3.0 Recheck in 1 week    Caryl Pina, MD [facility] 12/28/2019, 8:49 AM

## 2019-12-31 ENCOUNTER — Ambulatory Visit: Payer: Medicare Other | Admitting: Family Medicine

## 2019-12-31 DIAGNOSIS — N1831 Chronic kidney disease, stage 3a: Secondary | ICD-10-CM | POA: Diagnosis not present

## 2019-12-31 DIAGNOSIS — I5042 Chronic combined systolic (congestive) and diastolic (congestive) heart failure: Secondary | ICD-10-CM | POA: Diagnosis not present

## 2019-12-31 DIAGNOSIS — J9621 Acute and chronic respiratory failure with hypoxia: Secondary | ICD-10-CM | POA: Diagnosis not present

## 2019-12-31 DIAGNOSIS — J449 Chronic obstructive pulmonary disease, unspecified: Secondary | ICD-10-CM | POA: Diagnosis not present

## 2019-12-31 DIAGNOSIS — I5021 Acute systolic (congestive) heart failure: Secondary | ICD-10-CM | POA: Diagnosis not present

## 2019-12-31 DIAGNOSIS — I130 Hypertensive heart and chronic kidney disease with heart failure and stage 1 through stage 4 chronic kidney disease, or unspecified chronic kidney disease: Secondary | ICD-10-CM | POA: Diagnosis not present

## 2020-01-01 DIAGNOSIS — I130 Hypertensive heart and chronic kidney disease with heart failure and stage 1 through stage 4 chronic kidney disease, or unspecified chronic kidney disease: Secondary | ICD-10-CM | POA: Diagnosis not present

## 2020-01-01 DIAGNOSIS — J9621 Acute and chronic respiratory failure with hypoxia: Secondary | ICD-10-CM | POA: Diagnosis not present

## 2020-01-01 DIAGNOSIS — I5042 Chronic combined systolic (congestive) and diastolic (congestive) heart failure: Secondary | ICD-10-CM | POA: Diagnosis not present

## 2020-01-01 DIAGNOSIS — N1831 Chronic kidney disease, stage 3a: Secondary | ICD-10-CM | POA: Diagnosis not present

## 2020-01-01 DIAGNOSIS — J449 Chronic obstructive pulmonary disease, unspecified: Secondary | ICD-10-CM | POA: Diagnosis not present

## 2020-01-01 DIAGNOSIS — I5021 Acute systolic (congestive) heart failure: Secondary | ICD-10-CM | POA: Diagnosis not present

## 2020-01-03 ENCOUNTER — Other Ambulatory Visit: Payer: Self-pay

## 2020-01-03 ENCOUNTER — Ambulatory Visit (INDEPENDENT_AMBULATORY_CARE_PROVIDER_SITE_OTHER): Payer: Medicare Other

## 2020-01-03 ENCOUNTER — Telehealth (INDEPENDENT_AMBULATORY_CARE_PROVIDER_SITE_OTHER): Payer: Medicare Other | Admitting: *Deleted

## 2020-01-03 DIAGNOSIS — Z89612 Acquired absence of left leg above knee: Secondary | ICD-10-CM

## 2020-01-03 DIAGNOSIS — J9621 Acute and chronic respiratory failure with hypoxia: Secondary | ICD-10-CM | POA: Diagnosis not present

## 2020-01-03 DIAGNOSIS — N1831 Chronic kidney disease, stage 3a: Secondary | ICD-10-CM | POA: Diagnosis not present

## 2020-01-03 DIAGNOSIS — Z7951 Long term (current) use of inhaled steroids: Secondary | ICD-10-CM

## 2020-01-03 DIAGNOSIS — I4891 Unspecified atrial fibrillation: Secondary | ICD-10-CM | POA: Diagnosis not present

## 2020-01-03 DIAGNOSIS — J449 Chronic obstructive pulmonary disease, unspecified: Secondary | ICD-10-CM | POA: Diagnosis not present

## 2020-01-03 DIAGNOSIS — I5021 Acute systolic (congestive) heart failure: Secondary | ICD-10-CM

## 2020-01-03 DIAGNOSIS — M1990 Unspecified osteoarthritis, unspecified site: Secondary | ICD-10-CM

## 2020-01-03 DIAGNOSIS — I429 Cardiomyopathy, unspecified: Secondary | ICD-10-CM

## 2020-01-03 DIAGNOSIS — E785 Hyperlipidemia, unspecified: Secondary | ICD-10-CM

## 2020-01-03 DIAGNOSIS — Z86711 Personal history of pulmonary embolism: Secondary | ICD-10-CM

## 2020-01-03 DIAGNOSIS — I5042 Chronic combined systolic (congestive) and diastolic (congestive) heart failure: Secondary | ICD-10-CM

## 2020-01-03 DIAGNOSIS — N400 Enlarged prostate without lower urinary tract symptoms: Secondary | ICD-10-CM

## 2020-01-03 DIAGNOSIS — F329 Major depressive disorder, single episode, unspecified: Secondary | ICD-10-CM | POA: Diagnosis not present

## 2020-01-03 DIAGNOSIS — F0390 Unspecified dementia without behavioral disturbance: Secondary | ICD-10-CM

## 2020-01-03 DIAGNOSIS — I130 Hypertensive heart and chronic kidney disease with heart failure and stage 1 through stage 4 chronic kidney disease, or unspecified chronic kidney disease: Secondary | ICD-10-CM | POA: Diagnosis not present

## 2020-01-03 DIAGNOSIS — Z9181 History of falling: Secondary | ICD-10-CM

## 2020-01-03 DIAGNOSIS — K219 Gastro-esophageal reflux disease without esophagitis: Secondary | ICD-10-CM

## 2020-01-03 DIAGNOSIS — R319 Hematuria, unspecified: Secondary | ICD-10-CM

## 2020-01-03 DIAGNOSIS — Z9981 Dependence on supplemental oxygen: Secondary | ICD-10-CM

## 2020-01-03 DIAGNOSIS — Z86718 Personal history of other venous thrombosis and embolism: Secondary | ICD-10-CM

## 2020-01-03 DIAGNOSIS — I739 Peripheral vascular disease, unspecified: Secondary | ICD-10-CM

## 2020-01-03 NOTE — Telephone Encounter (Signed)
VM from [place] w/ [facility] Today INR 4.7  PT 56.3

## 2020-01-03 NOTE — Addendum Note (Signed)
Addended by: Caryl Pina on: 01/03/2020 05:13 PM   Modules accepted: Level of Service

## 2020-01-03 NOTE — Telephone Encounter (Signed)
Description   Hold today and tomorrow and then reduce weekly dose to take 2 2.5mg  tablets on Tuesdays and Thursdays and 1 2-1/2 mg tablet the rest of the week 4.7 today, INR goal 2.0-3.0 Recheck in 5 week    Spoke with patients niece about results and they will make change  Caryl Pina, MD [facility] 01/03/2020, 5:11 PM

## 2020-01-04 ENCOUNTER — Telehealth: Payer: Self-pay | Admitting: *Deleted

## 2020-01-04 NOTE — Telephone Encounter (Signed)
Scott [name] does this need to be in person visit or can it be virtual?

## 2020-01-04 NOTE — Telephone Encounter (Signed)
Please have patient set up with Almyra Free to discuss this possible change and see what his insurance could help cover and for prescription assistance and make sure he brings financial information form as well when they come.  As far as the agitation and anxiety medication that is always something that has to be discussed in the visit, we will discuss that at the next visit.

## 2020-01-04 NOTE — Telephone Encounter (Signed)
TC w/ [place] [facility] 1. Son is wondering if patient could be changed to Xarelto or Eliquis instead of the coumadin 2. Last week BP was running low, he stayed off his BP meds for a week. [place] nurse checked at visits this week, he ran 140/70 can pt take meds QD instead of BID 3. Family reports pt gets agitated at night, wanting to know about Rx for xanax 0.25, nurse suggesting maybe Ativan  You can call [facility] w/ [facility] back at [phone] or pt's son is at the home for a couple of weeks

## 2020-01-04 NOTE — Telephone Encounter (Signed)
Left detailed message on [facility]/ [facility] VM of changes with dose and to recheck in 5 days, per verbal verification with Dr. Warrick Parisian.

## 2020-01-07 ENCOUNTER — Telehealth: Payer: Self-pay | Admitting: Family Medicine

## 2020-01-07 DIAGNOSIS — N1831 Chronic kidney disease, stage 3a: Secondary | ICD-10-CM | POA: Diagnosis not present

## 2020-01-07 DIAGNOSIS — I5021 Acute systolic (congestive) heart failure: Secondary | ICD-10-CM | POA: Diagnosis not present

## 2020-01-07 DIAGNOSIS — I5042 Chronic combined systolic (congestive) and diastolic (congestive) heart failure: Secondary | ICD-10-CM | POA: Diagnosis not present

## 2020-01-07 DIAGNOSIS — I130 Hypertensive heart and chronic kidney disease with heart failure and stage 1 through stage 4 chronic kidney disease, or unspecified chronic kidney disease: Secondary | ICD-10-CM | POA: Diagnosis not present

## 2020-01-07 DIAGNOSIS — J9621 Acute and chronic respiratory failure with hypoxia: Secondary | ICD-10-CM | POA: Diagnosis not present

## 2020-01-07 DIAGNOSIS — J449 Chronic obstructive pulmonary disease, unspecified: Secondary | ICD-10-CM | POA: Diagnosis not present

## 2020-01-07 NOTE — Telephone Encounter (Signed)
VIRTUAL IS OKAY IF PATIENT IS UNABLE TO COME IN

## 2020-01-07 NOTE — Telephone Encounter (Signed)
Aware of recommendations and will call back with INR results

## 2020-01-07 NOTE — Telephone Encounter (Signed)
Virtual appointment made for prescription assistance

## 2020-01-07 NOTE — Telephone Encounter (Signed)
Have him check his INR and hold his Coumadin for now and let me know what the INR is and let me know how the clotting goes over the next couple days.

## 2020-01-08 ENCOUNTER — Other Ambulatory Visit: Payer: Self-pay | Admitting: Family Medicine

## 2020-01-08 ENCOUNTER — Telehealth: Payer: Self-pay | Admitting: *Deleted

## 2020-01-08 DIAGNOSIS — I130 Hypertensive heart and chronic kidney disease with heart failure and stage 1 through stage 4 chronic kidney disease, or unspecified chronic kidney disease: Secondary | ICD-10-CM | POA: Diagnosis not present

## 2020-01-08 DIAGNOSIS — I5042 Chronic combined systolic (congestive) and diastolic (congestive) heart failure: Secondary | ICD-10-CM

## 2020-01-08 DIAGNOSIS — N1831 Chronic kidney disease, stage 3a: Secondary | ICD-10-CM | POA: Diagnosis not present

## 2020-01-08 DIAGNOSIS — I5021 Acute systolic (congestive) heart failure: Secondary | ICD-10-CM | POA: Diagnosis not present

## 2020-01-08 DIAGNOSIS — J449 Chronic obstructive pulmonary disease, unspecified: Secondary | ICD-10-CM | POA: Diagnosis not present

## 2020-01-08 DIAGNOSIS — J9621 Acute and chronic respiratory failure with hypoxia: Secondary | ICD-10-CM | POA: Diagnosis not present

## 2020-01-08 DIAGNOSIS — Z86711 Personal history of pulmonary embolism: Secondary | ICD-10-CM

## 2020-01-08 NOTE — Telephone Encounter (Signed)
Vm from [place] w/ [facility] INR 3.5 PT 41.8

## 2020-01-09 DIAGNOSIS — I5021 Acute systolic (congestive) heart failure: Secondary | ICD-10-CM | POA: Diagnosis not present

## 2020-01-09 DIAGNOSIS — N1831 Chronic kidney disease, stage 3a: Secondary | ICD-10-CM | POA: Diagnosis not present

## 2020-01-09 DIAGNOSIS — J9621 Acute and chronic respiratory failure with hypoxia: Secondary | ICD-10-CM | POA: Diagnosis not present

## 2020-01-09 DIAGNOSIS — J449 Chronic obstructive pulmonary disease, unspecified: Secondary | ICD-10-CM | POA: Diagnosis not present

## 2020-01-09 DIAGNOSIS — I130 Hypertensive heart and chronic kidney disease with heart failure and stage 1 through stage 4 chronic kidney disease, or unspecified chronic kidney disease: Secondary | ICD-10-CM | POA: Diagnosis not present

## 2020-01-09 DIAGNOSIS — I5042 Chronic combined systolic (congestive) and diastolic (congestive) heart failure: Secondary | ICD-10-CM | POA: Diagnosis not present

## 2020-01-09 NOTE — Telephone Encounter (Signed)
Sspoke with Dr. Warrick Parisian concerning new orders for coumadin. Because of the previous blood/clots in urine. He wants to hold coumadin for 3 more days. On Saturday resume normal coumadin orders and test in one week. [facility] nurse and aware and private sitter

## 2020-01-09 NOTE — Telephone Encounter (Signed)
Description   Hold today and then continue weekly dose to take 2 2.5mg  tablets on Tuesdays and Thursdays and 1 2-1/2 mg tablet the rest of the week 3.7 today, INR goal 2.0-3.0 Recheck in 1 weeks    Caryl Pina, MD [place] 01/09/2020, 9:39 AM

## 2020-01-10 ENCOUNTER — Telehealth: Payer: Self-pay | Admitting: *Deleted

## 2020-01-10 ENCOUNTER — Encounter: Payer: Self-pay | Admitting: Family Medicine

## 2020-01-10 ENCOUNTER — Ambulatory Visit (INDEPENDENT_AMBULATORY_CARE_PROVIDER_SITE_OTHER): Payer: Medicare Other | Admitting: Family Medicine

## 2020-01-10 ENCOUNTER — Other Ambulatory Visit: Payer: Self-pay

## 2020-01-10 ENCOUNTER — Telehealth: Payer: Self-pay

## 2020-01-10 VITALS — BP 111/57 | HR 57 | Temp 97.5°F | Ht 66.0 in | Wt 137.0 lb

## 2020-01-10 DIAGNOSIS — I5040 Unspecified combined systolic (congestive) and diastolic (congestive) heart failure: Secondary | ICD-10-CM | POA: Diagnosis not present

## 2020-01-10 DIAGNOSIS — I480 Paroxysmal atrial fibrillation: Secondary | ICD-10-CM | POA: Diagnosis not present

## 2020-01-10 DIAGNOSIS — Z86711 Personal history of pulmonary embolism: Secondary | ICD-10-CM

## 2020-01-10 DIAGNOSIS — Z23 Encounter for immunization: Secondary | ICD-10-CM

## 2020-01-10 MED ORDER — APIXABAN 2.5 MG PO TABS: 2.5000 mg | ORAL_TABLET | Freq: Two times a day (BID) | ORAL | 0 refills | Status: DC

## 2020-01-10 MED ORDER — APIXABAN 2.5 MG PO TABS: 2.5000 mg | ORAL_TABLET | Freq: Two times a day (BID) | ORAL | 3 refills | Status: DC

## 2020-01-10 NOTE — Telephone Encounter (Signed)
Pt will start taking (2) 2.5mg  on Tues and Thur. All other days take (1) 2.5mg . Pt has follow up 01/11/20

## 2020-01-10 NOTE — Telephone Encounter (Signed)
Continue to hold the Coumadin like we had spoken for the full 5 days and then restart at the dose that we had previously indicated

## 2020-01-10 NOTE — Telephone Encounter (Signed)
Fax received mdINR PT/INR self testing service Test date/time 01/10/20 924 am INR 3.5  This has come down from 2 days ago, home health nurse is drawing Pt has in home machine, which I am pretty sure they have to do weekly to keep machine

## 2020-01-10 NOTE — Telephone Encounter (Signed)
After OV with Dettinger this afternoon pt will be discontinuing Coumadin indefinitely.  He has been prescribed Eliquis. Stew informed at [facility].

## 2020-01-10 NOTE — Progress Notes (Signed)
BP (!) 111/57   Pulse (!) 57   Temp (!) 97.5 F (36.4 C)   Ht 5' 6"  (1.676 m)   Wt 137 lb (62.1 kg)   BMI 22.11 kg/m    Subjective:   Patient ID: Scott [name], male    DOB: 03/10/1922, 86 y.o.   MRN: [id number]  HPI: Scott [name] is a 86 y.o. male presenting on 01/10/2020 for Hospitalization Follow-up   HPI Patient is coming in today for hospital follow-up for CHF.  He was having trouble breathing and swelling and went to the hospital and got some IV diuretics to pull off some fluid.  Patient was also found to be elevated on his Coumadin and has been struggling with this and is also been having some blood passed through his urine which has been a challenge as well.  Patient is still having some light tinges of blood in his urine but no clots like he was having earlier in the week.  His INR was done at home earlier today for the Coumadin at 3.5.  He is to hold his Coumadin and continue to hold it over the week.  They discussed possibly going to one of the newer agents like Eliquis and would like to go ahead and do that.  Patient has difficulty weighing himself daily because he is wheelchair-bound.  Relevant past medical, surgical, family and social history reviewed and updated as indicated. Interim medical history since our last visit reviewed. Allergies and medications reviewed and updated.  Review of Systems  Constitutional: Negative for chills and fever.  Eyes: Negative for visual disturbance.  Respiratory: Positive for shortness of breath (Mainly on exertion at this point). Negative for wheezing.   Cardiovascular: Negative for chest pain and leg swelling.  Gastrointestinal: Negative for abdominal pain.  Genitourinary: Positive for hematuria. Negative for difficulty urinating, frequency and urgency.  Musculoskeletal: Negative for back pain and gait problem.  Skin: Negative for rash.  All other systems reviewed and are negative.   Per HPI unless specifically indicated  above   Allergies as of 01/10/2020      Reactions   Rocephin [ceftriaxone Sodium In Dextrose] Other (See Comments)   Recurrent seizures   Lipitor [atorvastatin] Other (See Comments)   Unknown rxn per pt      Medication List       Accurate as of January 10, 2020  4:46 PM. If you have any questions, ask your nurse or doctor.        STOP taking these medications   warfarin 2.5 MG tablet Commonly known as: COUMADIN Stopped by: Worthy Rancher, MD   warfarin 5 MG tablet Commonly known as: COUMADIN Stopped by: Worthy Rancher, MD     TAKE these medications   acetaminophen 325 MG tablet Commonly known as: TYLENOL Take 2 tablets (650 mg total) by mouth every 6 (six) hours as needed for mild pain (or Fever >/= 101).   apixaban 2.5 MG Tabs tablet Commonly known as: Eliquis Take 1 tablet (2.5 mg total) by mouth 2 (two) times daily. Started by: Worthy Rancher, MD   apixaban 2.5 MG Tabs tablet Commonly known as: Eliquis Take 1 tablet (2.5 mg total) by mouth 2 (two) times daily. Started by: [name], MD   carvedilol 6.25 MG tablet Commonly known as: COREG TAKE  (1)  TABLET TWICE A DAY.   Combivent Respimat 20-100 MCG/ACT Aers respimat Generic drug: Ipratropium-Albuterol Inhale 1 puff into the lungs every 6 (six)  hours as needed for wheezing or shortness of breath (As needed for wheezing , cough or shortness of breath).   donepezil 10 MG tablet Commonly known as: ARICEPT TAKE 1 TABLET AT BEDTIME   Ensure Nutrition Shake Liqd Take 1 Bottle by mouth 4 (four) times daily. 1 bottle of chocolate flavored Ensure up to 4 times a day--give with ice-please What changed:   when to take this  reasons to take this  additional instructions   febuxostat 40 MG tablet Commonly known as: ULORIC TAKE 1 TABLET DAILY   fluticasone 50 MCG/ACT nasal spray Commonly known as: FLONASE SPRAY 1 SPRAY IN EACH NOSTRIL ONCE DAILY. What changed: See the new  instructions.   furosemide 20 MG tablet Commonly known as: LASIX TAKE (1) TABLET DAILY IN THE MORNING.   gabapentin 400 MG capsule Commonly known as: NEURONTIN Take 1 capsule (400 mg total) by mouth 2 (two) times daily.   Myrbetriq 25 MG Tb24 tablet Generic drug: mirabegron ER Take 1 tablet (25 mg total) by mouth in the morning and at bedtime.   pantoprazole 40 MG tablet Commonly known as: PROTONIX TAKE 1 TABLET DAILY   sertraline 50 MG tablet Commonly known as: ZOLOFT TAKE (1) TABLET DAILY IN THE MORNING.   simvastatin 10 MG tablet Commonly known as: ZOCOR TAKE 1 TABLET ONCE DAILY IN THE EVENING        Objective:   BP (!) 111/57   Pulse (!) 57   Temp (!) 97.5 F (36.4 C)   Ht 5' 6"  (1.676 m)   Wt 137 lb (62.1 kg)   BMI 22.11 kg/m   Wt Readings from Last 3 Encounters:  01/10/20 137 lb (62.1 kg)  12/21/19 137 lb 9.1 oz (62.4 kg)  11/02/19 152 lb (68.9 kg)    Physical Exam Vitals and nursing note reviewed.  Constitutional:      General: He is not in acute distress.    Appearance: He is well-developed. He is not diaphoretic.  Eyes:     General: No scleral icterus.    Conjunctiva/sclera: Conjunctivae normal.  Neck:     Thyroid: No thyromegaly.  Cardiovascular:     Rate and Rhythm: Normal rate. Rhythm irregular.     Heart sounds: Normal heart sounds. No murmur heard.   Pulmonary:     Effort: Pulmonary effort is normal. No respiratory distress.     Breath sounds: Normal breath sounds. No wheezing.  Musculoskeletal:        General: No swelling (Amputee of left lower extremity BKA,).     Cervical back: Neck supple.  Lymphadenopathy:     Cervical: No cervical adenopathy.  Skin:    General: Skin is warm and dry.     Findings: No rash.  Neurological:     Mental Status: He is alert and oriented to person, place, and time.     Coordination: Coordination normal.  Psychiatric:        Behavior: Behavior normal.       Assessment & Plan:   Problem List  Items Addressed This Visit      Cardiovascular and Mediastinum   Paroxysmal atrial fibrillation (HCC) - Primary   Relevant Medications   apixaban (ELIQUIS) 2.5 MG TABS tablet   apixaban (ELIQUIS) 2.5 MG TABS tablet   Other Relevant Orders   CBC with Differential/Platelet   CMP14+EGFR    Other Visit Diagnoses    Combined systolic and diastolic congestive heart failure, unspecified HF chronicity ([place])  Relevant Medications   apixaban (ELIQUIS) 2.5 MG TABS tablet   apixaban (ELIQUIS) 2.5 MG TABS tablet   Flu vaccine need       Relevant Orders   Flu Vaccine QUAD High Dose(Fluad) (Completed)      Will transition to Eliquis from Coumadin because of age and bleeding risk and instability and will use the lower dose. Follow up plan: Return in about 3 months (around 04/10/2020), or if symptoms worsen or fail to improve, for a fib .  Counseling provided for all of the vaccine components Orders Placed This Encounter  Procedures  . CBC with Differential/Platelet  . [place] Payzlee Ryder, MD [facility] 01/10/2020, 4:46 PM

## 2020-01-11 ENCOUNTER — Telehealth (INDEPENDENT_AMBULATORY_CARE_PROVIDER_SITE_OTHER): Payer: Medicare Other | Admitting: Pharmacist

## 2020-01-11 DIAGNOSIS — N1831 Chronic kidney disease, stage 3a: Secondary | ICD-10-CM | POA: Diagnosis not present

## 2020-01-11 DIAGNOSIS — I5042 Chronic combined systolic (congestive) and diastolic (congestive) heart failure: Secondary | ICD-10-CM | POA: Diagnosis not present

## 2020-01-11 DIAGNOSIS — I5021 Acute systolic (congestive) heart failure: Secondary | ICD-10-CM | POA: Diagnosis not present

## 2020-01-11 DIAGNOSIS — J9621 Acute and chronic respiratory failure with hypoxia: Secondary | ICD-10-CM | POA: Diagnosis not present

## 2020-01-11 DIAGNOSIS — J449 Chronic obstructive pulmonary disease, unspecified: Secondary | ICD-10-CM | POA: Diagnosis not present

## 2020-01-11 DIAGNOSIS — I480 Paroxysmal atrial fibrillation: Secondary | ICD-10-CM | POA: Diagnosis not present

## 2020-01-11 DIAGNOSIS — I130 Hypertensive heart and chronic kidney disease with heart failure and stage 1 through stage 4 chronic kidney disease, or unspecified chronic kidney disease: Secondary | ICD-10-CM | POA: Diagnosis not present

## 2020-01-11 LAB — CBC WITH DIFFERENTIAL/PLATELET
Basophils Absolute: 0 10*3/uL (ref 0.0–0.2)
Basos: 1 %
EOS (ABSOLUTE): 0.2 10*3/uL (ref 0.0–0.4)
Eos: 4 %
Hematocrit: 36.1 % — ABNORMAL LOW (ref 37.5–51.0)
Hemoglobin: 11.5 g/dL — ABNORMAL LOW (ref 13.0–17.7)
Immature Grans (Abs): 0 10*3/uL (ref 0.0–0.1)
Immature Granulocytes: 1 %
Lymphocytes Absolute: 1.3 10*3/uL (ref 0.7–3.1)
Lymphs: 31 %
MCH: 26.4 pg — ABNORMAL LOW (ref 26.6–33.0)
MCHC: 31.9 g/dL (ref 31.5–35.7)
MCV: 83 fL (ref 79–97)
Monocytes Absolute: 0.5 10*3/uL (ref 0.1–0.9)
Monocytes: 12 %
Neutrophils Absolute: 2.2 10*3/uL (ref 1.4–7.0)
Neutrophils: 51 %
Platelets: 185 10*3/uL (ref 150–450)
RBC: 4.36 x10E6/uL (ref 4.14–5.80)
RDW: 15.2 % (ref 11.6–15.4)
WBC: 4.2 10*3/uL (ref 3.4–10.8)

## 2020-01-11 LAB — CMP14+EGFR
ALT: 11 IU/L (ref 0–44)
AST: 17 IU/L (ref 0–40)
Albumin/Globulin Ratio: 1.6 (ref 1.2–2.2)
Albumin: 3.9 g/dL (ref 3.5–4.6)
Alkaline Phosphatase: 171 IU/L — ABNORMAL HIGH (ref 44–121)
BUN/Creatinine Ratio: 18 (ref 10–24)
BUN: 24 mg/dL (ref 10–36)
Bilirubin Total: 0.3 mg/dL (ref 0.0–1.2)
CO2: 25 mmol/L (ref 20–29)
Calcium: 9.2 mg/dL (ref 8.6–10.2)
Chloride: 110 mmol/L — ABNORMAL HIGH (ref 96–106)
Creatinine, Ser: 1.32 mg/dL — ABNORMAL HIGH (ref 0.76–1.27)
GFR calc Af Amer: 52 mL/min/{1.73_m2} — ABNORMAL LOW (ref >59)
GFR calc non Af Amer: 45 mL/min/{1.73_m2} — ABNORMAL LOW (ref >59)
Globulin, Total: 2.4 g/dL (ref 1.5–4.5)
Glucose: 102 mg/dL — ABNORMAL HIGH (ref 65–99)
Potassium: 4.2 mmol/L (ref 3.5–5.2)
Sodium: 146 mmol/L — ABNORMAL HIGH (ref 134–144)
Total Protein: 6.3 g/dL (ref 6.0–8.5)

## 2020-01-11 NOTE — Progress Notes (Signed)
    01/11/2020 Name: Scott [name] MRN: [id number] DOB: 08-22-1921   S:  29 yoM presents today for new medication education and guidance (chronic anticoagulation for atrial fibrillation).  Patient was found to have an elevated INRs this month.  Has been as high as 5.1 now down to 3.5.  He has been on Coumadin for years and has his own point of care INR device at home.  INRs have been very labile and patient with recent blood clots in the urine.  Family would like to continue anticoagulation.  We discussed Eliquis yesterday and family/patient were in agreement.    Due to COVID-19 pandemic this visit was conducted virtually/telephonically. This visit type was conducted due to national recommendations for restrictions regarding the COVID-19 Pandemic (e.g. social distancing, sheltering in place) in an effort to limit this patient's exposure and mitigate transmission in our community. All issues noted in this document were discussed and addressed.  A physical exam was not performed with this format.   I connected with caregiver on 01/11/20 at 0135 and verified that: I am speaking with the correct person using two identifiers. Caregiver is at the home of [name]. [place] during visit. The Scott [name], Scott [name], PharmD is located in their office at time of visit.   I discussed the limitations, risks, security and privacy concerns of performing an evaluation and management service by telephone and the availability of in person appointments. I also discussed with the patient that there may be a patient responsible charge related to this service. The patient expressed understanding and agreed to proceed.   Insurance coverage/medication affordability: medicare/express scripts   INR/Prothrombin Times     A/P:   Reviewed eliquis dosing with caregiver (adjusted dose to 2.5mg  BID per PI-->Body weight 60 kg or less in nonvalvular atrial fibrillation: 2.5 mg orally twice daily in patients with at least 2  of the following characteristics, age 84 years or older, body weight 60 kg or less, or serum creatinine 1.5 mg/dL (133 mcmol/L) or higher    Patient has his medications pill packaged by [facility] (pharamcy made aware)   Patient instructed to recheck INR at home on Monday and report back to [facility] PharmD; will start Eliquis once INR is <2  Switching from warfarin to apixaban: Discontinue warfarin and start apixaban when the INR is below 2   If copay increases, encouraged family to reach out to our office for patient assistance.  Total time in counseling 15 minutes.   Follow up with Pharmacist ON Monday 01/14/20  Regina Eck, PharmD, BCPS Clinical Pharmacist, [place]  II Phone [phone] .

## 2020-01-14 ENCOUNTER — Telehealth: Payer: Self-pay | Admitting: *Deleted

## 2020-01-14 ENCOUNTER — Telehealth: Payer: Self-pay | Admitting: Pharmacist

## 2020-01-14 DIAGNOSIS — I4821 Permanent atrial fibrillation: Secondary | ICD-10-CM | POA: Diagnosis not present

## 2020-01-14 DIAGNOSIS — Z86711 Personal history of pulmonary embolism: Secondary | ICD-10-CM

## 2020-01-14 NOTE — Telephone Encounter (Signed)
   PATIENT'S INR 1.8  OKAY TO START ELIQUIS TONIGHT  CAREGIVER REPORTED THIS WAS IN THE PILL PACK TO START THIS EVENING  WARFARIN HAS BEEN REMOVED

## 2020-01-14 NOTE — Telephone Encounter (Signed)
Fax received mdINR PT/INR self testing service Test date/time 01/14/20 920 am INR 1.8

## 2020-01-14 NOTE — Telephone Encounter (Signed)
Description   continue weekly dose to take 2 2.5mg  tablets on Tuesdays and Thursdays and 1 2-1/2 mg tablet the rest of the week 1.8 today, INR goal 2.0-3.0 Recheck in 1 weeks    Caryl Pina, MD [place] 01/14/2020, 12:20 PM

## 2020-01-15 DIAGNOSIS — I5021 Acute systolic (congestive) heart failure: Secondary | ICD-10-CM | POA: Diagnosis not present

## 2020-01-15 DIAGNOSIS — I5042 Chronic combined systolic (congestive) and diastolic (congestive) heart failure: Secondary | ICD-10-CM | POA: Diagnosis not present

## 2020-01-15 DIAGNOSIS — N1831 Chronic kidney disease, stage 3a: Secondary | ICD-10-CM | POA: Diagnosis not present

## 2020-01-15 DIAGNOSIS — I130 Hypertensive heart and chronic kidney disease with heart failure and stage 1 through stage 4 chronic kidney disease, or unspecified chronic kidney disease: Secondary | ICD-10-CM | POA: Diagnosis not present

## 2020-01-15 DIAGNOSIS — J9621 Acute and chronic respiratory failure with hypoxia: Secondary | ICD-10-CM | POA: Diagnosis not present

## 2020-01-15 DIAGNOSIS — J449 Chronic obstructive pulmonary disease, unspecified: Secondary | ICD-10-CM | POA: Diagnosis not present

## 2020-01-16 ENCOUNTER — Other Ambulatory Visit: Payer: Self-pay | Admitting: Family Medicine

## 2020-01-17 DIAGNOSIS — I130 Hypertensive heart and chronic kidney disease with heart failure and stage 1 through stage 4 chronic kidney disease, or unspecified chronic kidney disease: Secondary | ICD-10-CM | POA: Diagnosis not present

## 2020-01-17 DIAGNOSIS — J449 Chronic obstructive pulmonary disease, unspecified: Secondary | ICD-10-CM | POA: Diagnosis not present

## 2020-01-17 DIAGNOSIS — I5042 Chronic combined systolic (congestive) and diastolic (congestive) heart failure: Secondary | ICD-10-CM | POA: Diagnosis not present

## 2020-01-17 DIAGNOSIS — J9621 Acute and chronic respiratory failure with hypoxia: Secondary | ICD-10-CM | POA: Diagnosis not present

## 2020-01-17 DIAGNOSIS — N1831 Chronic kidney disease, stage 3a: Secondary | ICD-10-CM | POA: Diagnosis not present

## 2020-01-17 DIAGNOSIS — I5021 Acute systolic (congestive) heart failure: Secondary | ICD-10-CM | POA: Diagnosis not present

## 2020-01-21 ENCOUNTER — Telehealth: Payer: Self-pay | Admitting: *Deleted

## 2020-01-21 DIAGNOSIS — N1831 Chronic kidney disease, stage 3a: Secondary | ICD-10-CM | POA: Diagnosis not present

## 2020-01-21 DIAGNOSIS — Z86711 Personal history of pulmonary embolism: Secondary | ICD-10-CM

## 2020-01-21 DIAGNOSIS — I5021 Acute systolic (congestive) heart failure: Secondary | ICD-10-CM | POA: Diagnosis not present

## 2020-01-21 DIAGNOSIS — I130 Hypertensive heart and chronic kidney disease with heart failure and stage 1 through stage 4 chronic kidney disease, or unspecified chronic kidney disease: Secondary | ICD-10-CM | POA: Diagnosis not present

## 2020-01-21 DIAGNOSIS — I5042 Chronic combined systolic (congestive) and diastolic (congestive) heart failure: Secondary | ICD-10-CM | POA: Diagnosis not present

## 2020-01-21 DIAGNOSIS — J449 Chronic obstructive pulmonary disease, unspecified: Secondary | ICD-10-CM | POA: Diagnosis not present

## 2020-01-21 DIAGNOSIS — J9621 Acute and chronic respiratory failure with hypoxia: Secondary | ICD-10-CM | POA: Diagnosis not present

## 2020-01-21 NOTE — Telephone Encounter (Signed)
Fax received mdINR PT/INR self testing service Test date/time 01/21/20 148 pm INR 1.1

## 2020-01-21 NOTE — Telephone Encounter (Signed)
Closing encounter, Almyra Free called pt in another encounter

## 2020-01-21 NOTE — Telephone Encounter (Signed)
Patient was newly started on Eliquis.  Ongoing INR checks are no longer needed since he is not on warfarin treatment anymore.

## 2020-01-22 DIAGNOSIS — I130 Hypertensive heart and chronic kidney disease with heart failure and stage 1 through stage 4 chronic kidney disease, or unspecified chronic kidney disease: Secondary | ICD-10-CM | POA: Diagnosis not present

## 2020-01-22 DIAGNOSIS — N1831 Chronic kidney disease, stage 3a: Secondary | ICD-10-CM | POA: Diagnosis not present

## 2020-01-22 DIAGNOSIS — J449 Chronic obstructive pulmonary disease, unspecified: Secondary | ICD-10-CM | POA: Diagnosis not present

## 2020-01-22 DIAGNOSIS — I5042 Chronic combined systolic (congestive) and diastolic (congestive) heart failure: Secondary | ICD-10-CM | POA: Diagnosis not present

## 2020-01-22 DIAGNOSIS — I5021 Acute systolic (congestive) heart failure: Secondary | ICD-10-CM | POA: Diagnosis not present

## 2020-01-22 DIAGNOSIS — J9621 Acute and chronic respiratory failure with hypoxia: Secondary | ICD-10-CM | POA: Diagnosis not present

## 2020-01-23 DIAGNOSIS — Z86718 Personal history of other venous thrombosis and embolism: Secondary | ICD-10-CM | POA: Diagnosis not present

## 2020-01-23 DIAGNOSIS — I130 Hypertensive heart and chronic kidney disease with heart failure and stage 1 through stage 4 chronic kidney disease, or unspecified chronic kidney disease: Secondary | ICD-10-CM | POA: Diagnosis not present

## 2020-01-23 DIAGNOSIS — Z9181 History of falling: Secondary | ICD-10-CM | POA: Diagnosis not present

## 2020-01-23 DIAGNOSIS — J9621 Acute and chronic respiratory failure with hypoxia: Secondary | ICD-10-CM | POA: Diagnosis not present

## 2020-01-23 DIAGNOSIS — N1831 Chronic kidney disease, stage 3a: Secondary | ICD-10-CM | POA: Diagnosis not present

## 2020-01-23 DIAGNOSIS — N400 Enlarged prostate without lower urinary tract symptoms: Secondary | ICD-10-CM | POA: Diagnosis not present

## 2020-01-23 DIAGNOSIS — K219 Gastro-esophageal reflux disease without esophagitis: Secondary | ICD-10-CM | POA: Diagnosis not present

## 2020-01-23 DIAGNOSIS — Z9981 Dependence on supplemental oxygen: Secondary | ICD-10-CM | POA: Diagnosis not present

## 2020-01-23 DIAGNOSIS — F329 Major depressive disorder, single episode, unspecified: Secondary | ICD-10-CM | POA: Diagnosis not present

## 2020-01-23 DIAGNOSIS — M1990 Unspecified osteoarthritis, unspecified site: Secondary | ICD-10-CM | POA: Diagnosis not present

## 2020-01-23 DIAGNOSIS — F0390 Unspecified dementia without behavioral disturbance: Secondary | ICD-10-CM | POA: Diagnosis not present

## 2020-01-23 DIAGNOSIS — I429 Cardiomyopathy, unspecified: Secondary | ICD-10-CM | POA: Diagnosis not present

## 2020-01-23 DIAGNOSIS — I4891 Unspecified atrial fibrillation: Secondary | ICD-10-CM | POA: Diagnosis not present

## 2020-01-23 DIAGNOSIS — Z7951 Long term (current) use of inhaled steroids: Secondary | ICD-10-CM | POA: Diagnosis not present

## 2020-01-23 DIAGNOSIS — Z8551 Personal history of malignant neoplasm of bladder: Secondary | ICD-10-CM | POA: Diagnosis not present

## 2020-01-23 DIAGNOSIS — E785 Hyperlipidemia, unspecified: Secondary | ICD-10-CM | POA: Diagnosis not present

## 2020-01-23 DIAGNOSIS — I5042 Chronic combined systolic (congestive) and diastolic (congestive) heart failure: Secondary | ICD-10-CM | POA: Diagnosis not present

## 2020-01-23 DIAGNOSIS — Z89612 Acquired absence of left leg above knee: Secondary | ICD-10-CM | POA: Diagnosis not present

## 2020-01-23 DIAGNOSIS — I739 Peripheral vascular disease, unspecified: Secondary | ICD-10-CM | POA: Diagnosis not present

## 2020-01-23 DIAGNOSIS — J449 Chronic obstructive pulmonary disease, unspecified: Secondary | ICD-10-CM | POA: Diagnosis not present

## 2020-01-23 DIAGNOSIS — R319 Hematuria, unspecified: Secondary | ICD-10-CM | POA: Diagnosis not present

## 2020-01-23 DIAGNOSIS — Z86711 Personal history of pulmonary embolism: Secondary | ICD-10-CM | POA: Diagnosis not present

## 2020-01-23 DIAGNOSIS — I5021 Acute systolic (congestive) heart failure: Secondary | ICD-10-CM | POA: Diagnosis not present

## 2020-01-24 DIAGNOSIS — Z029 Encounter for administrative examinations, unspecified: Secondary | ICD-10-CM

## 2020-01-28 ENCOUNTER — Telehealth: Payer: Self-pay | Admitting: *Deleted

## 2020-01-28 DIAGNOSIS — Z86711 Personal history of pulmonary embolism: Secondary | ICD-10-CM

## 2020-01-28 NOTE — Telephone Encounter (Signed)
Please let the patient know that his INR is less than 2 which is 2 be expected and what we wanted him to go ahead and start the Eliquis today.  He also does not need to check INRs anymore unless we end up back on Coumadin in the future but for now he does not need to do anymore INRs Caryl Pina, MD [place] 01/28/2020, 1:32 PM

## 2020-01-28 NOTE — Telephone Encounter (Signed)
Caregiver aware and verbalizes understanding.

## 2020-01-28 NOTE — Telephone Encounter (Signed)
Fax received mdINR PT/INR self testing service Test date/time 01/28/20 947 am INR 1.2

## 2020-01-29 DIAGNOSIS — I5021 Acute systolic (congestive) heart failure: Secondary | ICD-10-CM | POA: Diagnosis not present

## 2020-01-29 DIAGNOSIS — N1831 Chronic kidney disease, stage 3a: Secondary | ICD-10-CM | POA: Diagnosis not present

## 2020-01-29 DIAGNOSIS — J449 Chronic obstructive pulmonary disease, unspecified: Secondary | ICD-10-CM | POA: Diagnosis not present

## 2020-01-29 DIAGNOSIS — I130 Hypertensive heart and chronic kidney disease with heart failure and stage 1 through stage 4 chronic kidney disease, or unspecified chronic kidney disease: Secondary | ICD-10-CM | POA: Diagnosis not present

## 2020-01-29 DIAGNOSIS — I5042 Chronic combined systolic (congestive) and diastolic (congestive) heart failure: Secondary | ICD-10-CM | POA: Diagnosis not present

## 2020-01-29 DIAGNOSIS — J9621 Acute and chronic respiratory failure with hypoxia: Secondary | ICD-10-CM | POA: Diagnosis not present

## 2020-01-30 DIAGNOSIS — I130 Hypertensive heart and chronic kidney disease with heart failure and stage 1 through stage 4 chronic kidney disease, or unspecified chronic kidney disease: Secondary | ICD-10-CM | POA: Diagnosis not present

## 2020-01-30 DIAGNOSIS — I5042 Chronic combined systolic (congestive) and diastolic (congestive) heart failure: Secondary | ICD-10-CM | POA: Diagnosis not present

## 2020-01-30 DIAGNOSIS — J449 Chronic obstructive pulmonary disease, unspecified: Secondary | ICD-10-CM | POA: Diagnosis not present

## 2020-01-30 DIAGNOSIS — J9621 Acute and chronic respiratory failure with hypoxia: Secondary | ICD-10-CM | POA: Diagnosis not present

## 2020-01-30 DIAGNOSIS — I5021 Acute systolic (congestive) heart failure: Secondary | ICD-10-CM | POA: Diagnosis not present

## 2020-01-30 DIAGNOSIS — N1831 Chronic kidney disease, stage 3a: Secondary | ICD-10-CM | POA: Diagnosis not present

## 2020-01-31 ENCOUNTER — Other Ambulatory Visit: Payer: Self-pay | Admitting: Family Medicine

## 2020-02-04 ENCOUNTER — Other Ambulatory Visit: Payer: Self-pay | Admitting: Family Medicine

## 2020-02-04 DIAGNOSIS — J9621 Acute and chronic respiratory failure with hypoxia: Secondary | ICD-10-CM | POA: Diagnosis not present

## 2020-02-04 DIAGNOSIS — I5042 Chronic combined systolic (congestive) and diastolic (congestive) heart failure: Secondary | ICD-10-CM | POA: Diagnosis not present

## 2020-02-04 DIAGNOSIS — I4821 Permanent atrial fibrillation: Secondary | ICD-10-CM | POA: Diagnosis not present

## 2020-02-04 DIAGNOSIS — I130 Hypertensive heart and chronic kidney disease with heart failure and stage 1 through stage 4 chronic kidney disease, or unspecified chronic kidney disease: Secondary | ICD-10-CM | POA: Diagnosis not present

## 2020-02-04 DIAGNOSIS — N1831 Chronic kidney disease, stage 3a: Secondary | ICD-10-CM | POA: Diagnosis not present

## 2020-02-04 DIAGNOSIS — J449 Chronic obstructive pulmonary disease, unspecified: Secondary | ICD-10-CM | POA: Diagnosis not present

## 2020-02-04 DIAGNOSIS — I5021 Acute systolic (congestive) heart failure: Secondary | ICD-10-CM | POA: Diagnosis not present

## 2020-02-05 ENCOUNTER — Telehealth: Payer: Self-pay | Admitting: *Deleted

## 2020-02-05 DIAGNOSIS — Z86711 Personal history of pulmonary embolism: Secondary | ICD-10-CM

## 2020-02-05 NOTE — Telephone Encounter (Signed)
Patient aware and verbalized understanding. °

## 2020-02-05 NOTE — Telephone Encounter (Signed)
Please have patient D/C INR as we do not do this with Eliquis and he is no longer on warfarin.

## 2020-02-05 NOTE — Telephone Encounter (Signed)
Fax received & TC call from [place] PT/INR self testing service Test date/time 02/04/20 922 am INR 1.3

## 2020-02-07 DIAGNOSIS — I5042 Chronic combined systolic (congestive) and diastolic (congestive) heart failure: Secondary | ICD-10-CM | POA: Diagnosis not present

## 2020-02-07 DIAGNOSIS — I5021 Acute systolic (congestive) heart failure: Secondary | ICD-10-CM | POA: Diagnosis not present

## 2020-02-07 DIAGNOSIS — J449 Chronic obstructive pulmonary disease, unspecified: Secondary | ICD-10-CM | POA: Diagnosis not present

## 2020-02-07 DIAGNOSIS — I130 Hypertensive heart and chronic kidney disease with heart failure and stage 1 through stage 4 chronic kidney disease, or unspecified chronic kidney disease: Secondary | ICD-10-CM | POA: Diagnosis not present

## 2020-02-07 DIAGNOSIS — N1831 Chronic kidney disease, stage 3a: Secondary | ICD-10-CM | POA: Diagnosis not present

## 2020-02-07 DIAGNOSIS — J9621 Acute and chronic respiratory failure with hypoxia: Secondary | ICD-10-CM | POA: Diagnosis not present

## 2020-02-07 NOTE — H&P (View-Only) (Signed)
Subjective:  1. History of bladder cancer     GU hx: Scott [name] returns today in f/u for his history of urothelial cancer.  He last had a TURBT of recurrent prostatic urethral LG NMIBC on 10/11/19.  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     He has some dysuria but no difficulty voiding.  He has urgency and can have UUI.   He remains on Myrbetriq.  He has a history of UTI's but is not on treatment now.    He has no pain.        ROS:  ROS:  A complete review of systems was performed.  All systems are negative except for pertinent findings as noted.   ROS  Allergies  Allergen Reactions  . Rocephin [Ceftriaxone Sodium In Dextrose] Other (See Comments)    Recurrent seizures  . Lipitor [Atorvastatin] Other (See Comments)    Unknown rxn per pt    Outpatient Encounter Medications as of 02/08/2020  Medication Sig  . acetaminophen (TYLENOL) 325 MG tablet Take 2 tablets (650 mg total) by mouth every 6 (six) hours as needed for mild pain (or Fever >/= 101).  Marland Kitchen apixaban (ELIQUIS) 2.5 MG TABS tablet Take 1 tablet (2.5 mg total) by mouth 2 (two) times daily.  . carvedilol (COREG) 6.25 MG tablet TAKE  (1)  TABLET TWICE A DAY.  Marland Kitchen donepezil (ARICEPT) 10 MG tablet TAKE 1 TABLET AT BEDTIME  . febuxostat (ULORIC) 40 MG tablet TAKE 1 TABLET DAILY  . fluticasone (FLONASE) 50 MCG/ACT nasal spray SPRAY 1 SPRAY IN EACH NOSTRIL ONCE DAILY. (Patient taking differently: Place 1 spray into both nostrils daily as needed for allergies or rhinitis. )  . furosemide (LASIX) 20 MG tablet TAKE (1) TABLET DAILY IN THE MORNING.  Marland Kitchen gabapentin (NEURONTIN) 400 MG capsule Take 1 capsule (400 mg total) by mouth 2 (two) times daily.  . Ipratropium-Albuterol (COMBIVENT RESPIMAT) 20-100 MCG/ACT AERS respimat Inhale 1 puff into the lungs every 6 (six) hours as needed for wheezing or shortness of breath (As needed for wheezing , cough or shortness of breath).  . MYRBETRIQ 25 MG  TB24 tablet Take 1 tablet (25 mg total) by mouth in the morning and at bedtime.  . Nutritional Supplements (ENSURE NUTRITION SHAKE) LIQD Take 1 Bottle by mouth 4 (four) times daily. 1 bottle of chocolate flavored Ensure up to 4 times a day--give with ice-please (Patient taking differently: Take 1 Bottle by mouth daily as needed (When he does not eat well). )  . pantoprazole (PROTONIX) 40 MG tablet TAKE 1 TABLET DAILY  . sertraline (ZOLOFT) 50 MG tablet TAKE (1) TABLET DAILY IN THE MORNING.  . simvastatin (ZOCOR) 10 MG tablet TAKE 1 TABLET ONCE DAILY IN THE EVENING   Facility-Administered Encounter Medications as of 02/08/2020  Medication  . ciprofloxacin (CIPRO) tablet 500 mg    Past Medical History:  Diagnosis Date  . Arthritis    "all over"  . Atrial fibrillation ([place])   . Benign localized prostatic hyperplasia with lower urinary tract symptoms (LUTS)   . Bradycardia 11/28/2015   Severe-HR in the 20s to 30s following intubation for urological procedure.  Marland Kitchen CAD- non obstructive disease by cath 3/14 cardiologist-  dr hochrein  . Chronic lower back pain   . COPD (chronic obstructive pulmonary disease) ([place])   . DDD (degenerative disc disease)   . Depression   . Dyspnea    w/ exertion  and lying down (raise head)  . ETOH abuse   . GERD (gastroesophageal reflux disease)   . Glaucoma   . Glaucoma, both eyes   . History of bladder cancer urologist-- dr Jeffie Pollock   first dx 2012--  s/p TURBT's  . History of DVT of lower extremity 03/2013   bilateral   . History of pulmonary embolus (PE) 04/2013  . HTN (hypertension)   . Hyperlipidemia   . Hypertension   . LBBB (left bundle branch block)    chronic  . Non-ischemic cardiomyopathy ([place])    last echo 01-18-2017, ef 35-40%  . NSVT (nonsustained ventricular tachycardia) ([place])    a. NSVT 06/2012; NSVT also seen during 03/2013 adm. b. Med rx. Not candidate for ICD given adv age.  Marland Kitchen PAD (peripheral artery disease) ([place])   . Popliteal artery  aneurysm, bilateral ([facility])    DOCUMENTED CHRONIC PARTIAL OCCLUSION--  PT DENIES CLAUDICATION OR ANY OTHER SYMPTOMS  . PVCs (premature ventricular contractions)   . PVD (peripheral vascular disease) ([facility])    Right ABI .75, Left .78 (2006)  . Syncope 06/2012   a. Felt to be postural syncope related to diuretics 06/2012.  Marland Kitchen Systolic and diastolic CHF, chronic ([facility]) cardiologsit-  dr hochrein   a. NICM - patent cors 06/2012, EF 40% at that time. b. 03/2013 eval: EF 20-25%.  . Vision loss, left eye    due to glaucoma  . Wears glasses     Past Surgical History:  Procedure Laterality Date  . AMPUTATION Left 10/16/2014   Procedure: AMPUTATION ABOVE KNEE- LEFT;  Surgeon: Mal Misty, MD;  Location: [facility];  Service: Vascular;  Laterality: Left;  . CARDIAC CATHETERIZATION  05-11-2004  DR [facility]   MINIMAL CORANARY PLAQUE/ NORMAL LVF/ EF 55%/  NON-OBSTRUCTIVE LAD 25%  . CARDIAC CATHETERIZATION  06/18/2012   dr hochrein   mild luminal irregularities of coronaries, ef 45-50%  . CARDIOVASCULAR STRESS TEST  10-15-2010   LOW RISK NUCLEAR STUDY/ NO EVIDENCE OF ISCHEMIA/ NORMAL EF  . CATARACT EXTRACTION W/ INTRAOCULAR LENS  IMPLANT, BILATERAL Bilateral   . CYSTOSCOPY  10/07/2011   Procedure: CYSTOSCOPY;  Surgeon: Malka So, MD;  Location[facility];  Service: Urology;  Laterality: N/A;  . CYSTOSCOPY N/A 10/20/2018   Procedure: CYSTOSCOPY WITH FULGURATION OF PROSTATIC URETHRAL TUMOR;  Surgeon: [name], MD;  Location: [facility];  Service: Urology;  Laterality: N/A;  . CYSTOSCOPY N/A 10/11/2019   Procedure: CYSTOSCOPY;  Surgeon: Cleon Gustin, MD;  Location: [facility];  Service: Urology;  Laterality: N/A;  . CYSTOSCOPY WITH BIOPSY  03/14/2012   Procedure: CYSTOSCOPY WITH BIOPSY;  Surgeon: Malka So, MD;  Location: [facility];  Service: Urology;  Laterality: N/A;  WITH FULGURATION  . CYSTOSCOPY WITH BIOPSY N/A 12/09/2016   Procedure: CYSTOSCOPY WITH BIOPSY AND FULGURATION;  Surgeon: [name], MD;  Location: [facility];  Service: Urology;  Laterality: N/A;  . CYSTOSCOPY WITH BIOPSY N/A 12/20/2017   Procedure: CYSTOSCOPY WITH BIOPSY WITH FULGURATION;  Surgeon: [name], MD;  Location: [facility];  Service: Urology;  Laterality: N/A;  . CYSTOSCOPY WITH BIOPSY N/A 06/09/2018   Procedure: CYSTOSCOPY WITH BIOPSY AND FULGURATION;  Surgeon: [name], MD;  Location: [facility];  Service: Urology;  Laterality: N/A;  . CYSTOSCOPY WITH FULGERATION N/A 01/20/2016   Procedure: CYSTOSCOPY, BIOPSY WITH FULGERATION OF URTHRAL TUMOR;  Surgeon: [name], MD;  Location: [facility];  Service: Urology;  Laterality: N/A;  . CYSTOSCOPY WITH FULGERATION  N/A 06/01/2016   Procedure: CYSTOSCOPY WITH FULGERATION urethral tumors and bladder neck;  Surgeon: [name], MD;  Location: [facility];  Service: Urology;  Laterality: N/A;  . SHOULDER SURGERY Left 1970's  . TONSILLECTOMY    . TRANSTHORACIC ECHOCARDIOGRAM  01-18-2017   dr hochrein   moderate LVH, ef 35-40%, diffuse hypokinesis/  trivial AR and MR/  mild TR  . TRANSURETHRAL RESECTION OF BLADDER TUMOR  10/07/2011   Procedure: TRANSURETHRAL RESECTION OF BLADDER TUMOR (TURBT);  Surgeon: Malka So, MD;  Location[facility];  Service: Urology;  Laterality: N/A;  . TRANSURETHRAL RESECTION OF BLADDER TUMOR N/A 07/10/2015   Procedure: TRANSURETHRAL RESECTION OF BLADDER TUMOR (TURBT), CYSTOSCOPY ;  Surgeon: [name], MD;  Location: [facility];  Service: Urology;  Laterality: N/A;  . TRANSURETHRAL RESECTION OF BLADDER TUMOR N/A 10/11/2019   Procedure: TRANSURETHRAL RESECTION OF PROSTATIC URETHRAL TUMOR;  Surgeon: Cleon Gustin, MD;  Location: [facility];  Service: Urology;  Laterality: N/A;  . TRANSURETHRAL RESECTION OF PROSTATE  03/14/2012   Procedure: TRANSURETHRAL RESECTION OF THE PROSTATE WITH GYRUS INSTRUMENTS;  Surgeon: Malka So, MD;  Location: [facility];  Service: Urology;  Laterality: N/A;    Social History   Socioeconomic History  . Marital  status: Married    Spouse name: Not on file  . Number of children: 1  . Years of education: Not on file  . Highest education level: Not on file  Occupational History  . Occupation: Retired  Tobacco Use  . Smoking status: Former Smoker    Packs/day: 1.00    Years: 35.00    Pack years: 35.00    Types: Cigarettes    Start date: 04/12/1942    Quit date: 04/13/1975    Years since quitting: 44.8  . Smokeless tobacco: Never Used  Vaping Use  . Vaping Use: Never used  Substance and Sexual Activity  . Alcohol use: Not Currently    Alcohol/week: 7.0 standard drinks    Types: 7 Cans of beer per week  . Drug use: No  . Sexual activity: Never  Other Topics Concern  . Not on file  Social History Narrative   Lives in [place] with spouse who is s/p spinal cord injury.  He has one grown son who lives in [place].  He is retired but worked for [employer] for years and lives in [place].   Social Determinants of Health   Financial Resource Strain:   . Difficulty of Paying Living Expenses: Not on file  Food Insecurity:   . Worried About Charity fundraiser in the Last Year: Not on file  . Ran Out of Food in the Last Year: Not on file  Transportation Needs:   . Lack of Transportation (Medical): Not on file  . Lack of Transportation (Non-Medical): Not on file  Physical Activity:   . Days of Exercise per Week: Not on file  . Minutes of Exercise per Session: Not on file  Stress:   . Feeling of Stress : Not on file  Social Connections:   . Frequency of Communication with Friends and Family: Not on file  . Frequency of Social Gatherings with Friends and Family: Not on file  . Attends Religious Services: Not on file  . Active Member of Clubs or Organizations: Not on file  . Attends Archivist Meetings: Not on file  . Marital Status: Not on file  Intimate Partner Violence:   . Fear of Current or Ex-Partner:  Not on file  . Emotionally Abused: Not on file  . Physically Abused:  Not on file  . Sexually Abused: Not on file    Family History  Problem Relation Age of Onset  . Hypertension Mother   . Arthritis Mother   . Lung cancer Sister   . Deep vein thrombosis Father   . Early death Brother        Objective: There were no vitals filed for this visit.   Physical Exam Vitals reviewed.  Constitutional:      Appearance: Normal appearance.  Cardiovascular:     Rate and Rhythm: Normal rate.     Heart sounds: Murmur heard.   Pulmonary:     Effort: Pulmonary effort is normal. No respiratory distress.     Breath sounds: Normal breath sounds.  Abdominal:     General: Abdomen is flat.     Palpations: Abdomen is soft.  Genitourinary:    Penis: Normal.   Skin:    General: Skin is warm and dry.  Neurological:     General: No focal deficit present.     Mental Status: He is alert and oriented to person, place, and time.     Lab Results:  No results found. However, due to the size of the patient record, not all encounters were searched. Please check Results Review for a complete set of results.  BMET No results for input(s): NA, K, CL, CO2, GLUCOSE, BUN, CREATININE, CALCIUM in the last 72 hours. PSA PSA  Date Value Ref Range Status  04/11/2015 1.08 0.00 - 4.00 ng/mL Final    Comment:    (NOTE) While PSA levels of <=4.0 ng/ml are reported as reference range, some men with levels below 4.0 ng/ml can have prostate cancer and many men with PSA above 4.0 ng/ml do not have prostate cancer.  Other tests such as free PSA, age specific reference ranges, PSA velocity and PSA doubling time may be helpful especially in men less than 59 years old. Performed at [facility]   12/25/2010 1.49 <=4.00 ng/mL Final    Comment:    Test Methodology: ECLIA PSA (Electrochemiluminescence Immunoassay)   No results found for: TESTOSTERONE    Studies/Results: Procedure: Cystoscopy.  He was prepped with betadine and the urethra was instilled with 2%  lidocaine jelly.  Cipro 500mg  po was given.  Cystoscopy was done with the flexible scope.  The urethral is normal.  The external sphincter is intact.  There is a papillary tumor at the right prostatic apex that appears to be about 1.5cm.  There is some right prostatic regrowth.  There is a 2-3 cm tumor hanging from the bladder neck.  There are multiple small tumors on the dome.  The UO's were not seen.  There is moderate trabeculation.  There were no complications.     Assessment & Plan: History of urothelial carcinoma of the prostate and urethra now with recurrences in the bladder and prostatic urethra.    I will get cardiac clearance for him to hold the Eliquis and will plan on cystoscopy with bilateral RTG's, TURBT and instillation of Gemcitabine.   I have reviewed the risks in detail.      Meds ordered this encounter  Medications  . ciprofloxacin (CIPRO) tablet 500 mg     Orders Placed This Encounter  Procedures  . Urinalysis, Routine w reflex microscopic      Return in about 3 months (around 05/10/2020) for Cystoscopy.   CC: Dettinger, Fransisca Kaufmann, MD  [name] 02/08/2020

## 2020-02-07 NOTE — Progress Notes (Signed)
Subjective:  1. History of bladder cancer     GU hx: Scott [name] returns today in f/u for his history of urothelial cancer.  He last had a TURBT of recurrent prostatic urethral LG NMIBC on 10/11/19.  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     He has some dysuria but no difficulty voiding.  He has urgency and can have UUI.   He remains on Myrbetriq.  He has a history of UTI's but is not on treatment now.    He has no pain.        ROS:  ROS:  A complete review of systems was performed.  All systems are negative except for pertinent findings as noted.   ROS  Allergies  Allergen Reactions  . Rocephin [Ceftriaxone Sodium In Dextrose] Other (See Comments)    Recurrent seizures  . Lipitor [Atorvastatin] Other (See Comments)    Unknown rxn per pt    Outpatient Encounter Medications as of 02/08/2020  Medication Sig  . acetaminophen (TYLENOL) 325 MG tablet Take 2 tablets (650 mg total) by mouth every 6 (six) hours as needed for mild pain (or Fever >/= 101).  Marland Kitchen apixaban (ELIQUIS) 2.5 MG TABS tablet Take 1 tablet (2.5 mg total) by mouth 2 (two) times daily.  . carvedilol (COREG) 6.25 MG tablet TAKE  (1)  TABLET TWICE A DAY.  Marland Kitchen donepezil (ARICEPT) 10 MG tablet TAKE 1 TABLET AT BEDTIME  . febuxostat (ULORIC) 40 MG tablet TAKE 1 TABLET DAILY  . fluticasone (FLONASE) 50 MCG/ACT nasal spray SPRAY 1 SPRAY IN EACH NOSTRIL ONCE DAILY. (Patient taking differently: Place 1 spray into both nostrils daily as needed for allergies or rhinitis. )  . furosemide (LASIX) 20 MG tablet TAKE (1) TABLET DAILY IN THE MORNING.  Marland Kitchen gabapentin (NEURONTIN) 400 MG capsule Take 1 capsule (400 mg total) by mouth 2 (two) times daily.  . Ipratropium-Albuterol (COMBIVENT RESPIMAT) 20-100 MCG/ACT AERS respimat Inhale 1 puff into the lungs every 6 (six) hours as needed for wheezing or shortness of breath (As needed for wheezing , cough or shortness of breath).  . MYRBETRIQ 25 MG  TB24 tablet Take 1 tablet (25 mg total) by mouth in the morning and at bedtime.  . Nutritional Supplements (ENSURE NUTRITION SHAKE) LIQD Take 1 Bottle by mouth 4 (four) times daily. 1 bottle of chocolate flavored Ensure up to 4 times a day--give with ice-please (Patient taking differently: Take 1 Bottle by mouth daily as needed (When he does not eat well). )  . pantoprazole (PROTONIX) 40 MG tablet TAKE 1 TABLET DAILY  . sertraline (ZOLOFT) 50 MG tablet TAKE (1) TABLET DAILY IN THE MORNING.  . simvastatin (ZOCOR) 10 MG tablet TAKE 1 TABLET ONCE DAILY IN THE EVENING   Facility-Administered Encounter Medications as of 02/08/2020  Medication  . ciprofloxacin (CIPRO) tablet 500 mg    Past Medical History:  Diagnosis Date  . Arthritis    "all over"  . Atrial fibrillation ([place])   . Benign localized prostatic hyperplasia with lower urinary tract symptoms (LUTS)   . Bradycardia 11/28/2015   Severe-HR in the 20s to 30s following intubation for urological procedure.  Marland Kitchen CAD- non obstructive disease by cath 3/14 cardiologist-  dr hochrein  . Chronic lower back pain   . COPD (chronic obstructive pulmonary disease) ([place])   . DDD (degenerative disc disease)   . Depression   . Dyspnea    w/ exertion  and lying down (raise head)  . ETOH abuse   . GERD (gastroesophageal reflux disease)   . Glaucoma   . Glaucoma, both eyes   . History of bladder cancer urologist-- dr Jeffie Pollock   first dx 2012--  s/p TURBT's  . History of DVT of lower extremity 03/2013   bilateral   . History of pulmonary embolus (PE) 04/2013  . HTN (hypertension)   . Hyperlipidemia   . Hypertension   . LBBB (left bundle branch block)    chronic  . Non-ischemic cardiomyopathy ([place])    last echo 01-18-2017, ef 35-40%  . NSVT (nonsustained ventricular tachycardia) ([place])    a. NSVT 06/2012; NSVT also seen during 03/2013 adm. b. Med rx. Not candidate for ICD given adv age.  Marland Kitchen PAD (peripheral artery disease) ([place])   . Popliteal artery  aneurysm, bilateral ([facility])    DOCUMENTED CHRONIC PARTIAL OCCLUSION--  PT DENIES CLAUDICATION OR ANY OTHER SYMPTOMS  . PVCs (premature ventricular contractions)   . PVD (peripheral vascular disease) ([facility])    Right ABI .75, Left .78 (2006)  . Syncope 06/2012   a. Felt to be postural syncope related to diuretics 06/2012.  Marland Kitchen Systolic and diastolic CHF, chronic ([facility]CC) cardiologsit-  dr hochrein   a. NICM - patent cors 06/2012, EF 40% at that time. b. 03/2013 eval: EF 20-25%.  . Vision loss, left eye    due to glaucoma  . Wears glasses     Past Surgical History:  Procedure Laterality Date  . AMPUTATION Left 10/16/2014   Procedure: AMPUTATION ABOVE KNEE- LEFT;  Surgeon: Mal Misty, MD;  Location: [facility];  Service: Vascular;  Laterality: Left;  . CARDIAC CATHETERIZATION  05-11-2004  DR [facility]   MINIMAL CORANARY PLAQUE/ NORMAL LVF/ EF 55%/  NON-OBSTRUCTIVE LAD 25%  . CARDIAC CATHETERIZATION  06/18/2012   dr hochrein   mild luminal irregularities of coronaries, ef 45-50%  . CARDIOVASCULAR STRESS TEST  10-15-2010   LOW RISK NUCLEAR STUDY/ NO EVIDENCE OF ISCHEMIA/ NORMAL EF  . CATARACT EXTRACTION W/ INTRAOCULAR LENS  IMPLANT, BILATERAL Bilateral   . CYSTOSCOPY  10/07/2011   Procedure: CYSTOSCOPY;  Surgeon: Malka So, MD;  Location: [facility];  Service: Urology;  Laterality: N/A;  . CYSTOSCOPY N/A 10/20/2018   Procedure: CYSTOSCOPY WITH FULGURATION OF PROSTATIC URETHRAL TUMOR;  Surgeon: [name], MD;  Location: [facility];  Service: Urology;  Laterality: N/A;  . CYSTOSCOPY N/A 10/11/2019   Procedure: CYSTOSCOPY;  Surgeon: Cleon Gustin, MD;  Location: [facility];  Service: Urology;  Laterality: N/A;  . CYSTOSCOPY WITH BIOPSY  03/14/2012   Procedure: CYSTOSCOPY WITH BIOPSY;  Surgeon: Malka So, MD;  Location: [facility];  Service: Urology;  Laterality: N/A;  WITH FULGURATION  . CYSTOSCOPY WITH BIOPSY N/A 12/09/2016   Procedure: CYSTOSCOPY WITH BIOPSY AND FULGURATION;  Surgeon: [name], MD;  Location: [facility];  Service: Urology;  Laterality: N/A;  . CYSTOSCOPY WITH BIOPSY N/A 12/20/2017   Procedure: CYSTOSCOPY WITH BIOPSY WITH FULGURATION;  Surgeon: [name], MD;  Location: [facility];  Service: Urology;  Laterality: N/A;  . CYSTOSCOPY WITH BIOPSY N/A 06/09/2018   Procedure: CYSTOSCOPY WITH BIOPSY AND FULGURATION;  Surgeon: [name], MD;  Location: [facility];  Service: Urology;  Laterality: N/A;  . CYSTOSCOPY WITH FULGERATION N/A 01/20/2016   Procedure: CYSTOSCOPY, BIOPSY WITH FULGERATION OF URTHRAL TUMOR;  Surgeon: [name], MD;  Location[facility];  Service: Urology;  Laterality: N/A;  . CYSTOSCOPY WITH FULGERATION  N/A 06/01/2016   Procedure: CYSTOSCOPY WITH FULGERATION urethral tumors and bladder neck;  Surgeon: [name], MD;  Location: [facility];  Service: Urology;  Laterality: N/A;  . SHOULDER SURGERY Left 1970's  . TONSILLECTOMY    . TRANSTHORACIC ECHOCARDIOGRAM  01-18-2017   dr hochrein   moderate LVH, ef 35-40%, diffuse hypokinesis/  trivial AR and MR/  mild TR  . TRANSURETHRAL RESECTION OF BLADDER TUMOR  10/07/2011   Procedure: TRANSURETHRAL RESECTION OF BLADDER TUMOR (TURBT);  Surgeon: Malka So, MD;  Location: [facility];  Service: Urology;  Laterality: N/A;  . TRANSURETHRAL RESECTION OF BLADDER TUMOR N/A 07/10/2015   Procedure: TRANSURETHRAL RESECTION OF BLADDER TUMOR (TURBT), CYSTOSCOPY ;  Surgeon: [name], MD;  Location[facility];  Service: Urology;  Laterality: N/A;  . TRANSURETHRAL RESECTION OF BLADDER TUMOR N/A 10/11/2019   Procedure: TRANSURETHRAL RESECTION OF PROSTATIC URETHRAL TUMOR;  Surgeon: Cleon Gustin, MD;  Location: [facility];  Service: Urology;  Laterality: N/A;  . TRANSURETHRAL RESECTION OF PROSTATE  03/14/2012   Procedure: TRANSURETHRAL RESECTION OF THE PROSTATE WITH GYRUS INSTRUMENTS;  Surgeon: Malka So, MD;  Location[facility];  Service: Urology;  Laterality: N/A;    Social History   Socioeconomic History  . Marital  status: Married    Spouse name: Not on file  . Number of children: 1  . Years of education: Not on file  . Highest education level: Not on file  Occupational History  . Occupation: Retired  Tobacco Use  . Smoking status: Former Smoker    Packs/day: 1.00    Years: 35.00    Pack years: 35.00    Types: Cigarettes    Start date: 04/12/1942    Quit date: 04/13/1975    Years since quitting: 44.8  . Smokeless tobacco: Never Used  Vaping Use  . Vaping Use: Never used  Substance and Sexual Activity  . Alcohol use: Not Currently    Alcohol/week: 7.0 standard drinks    Types: 7 Cans of beer per week  . Drug use: No  . Sexual activity: Never  Other Topics Concern  . Not on file  Social History Narrative   Lives in [place] with spouse who is s/p spinal cord injury.  He has one grown son who lives in [place].  He is retired but worked for [employer] for years and lives in [place].   Social Determinants of Health   Financial Resource Strain:   . Difficulty of Paying Living Expenses: Not on file  Food Insecurity:   . Worried About Charity fundraiser in the Last Year: Not on file  . Ran Out of Food in the Last Year: Not on file  Transportation Needs:   . Lack of Transportation (Medical): Not on file  . Lack of Transportation (Non-Medical): Not on file  Physical Activity:   . Days of Exercise per Week: Not on file  . Minutes of Exercise per Session: Not on file  Stress:   . Feeling of Stress : Not on file  Social Connections:   . Frequency of Communication with Friends and Family: Not on file  . Frequency of Social Gatherings with Friends and Family: Not on file  . Attends Religious Services: Not on file  . Active Member of Clubs or Organizations: Not on file  . Attends Archivist Meetings: Not on file  . Marital Status: Not on file  Intimate Partner Violence:   . Fear of Current or Ex-Partner:  Not on file  . Emotionally Abused: Not on file  . Physically Abused:  Not on file  . Sexually Abused: Not on file    Family History  Problem Relation Age of Onset  . Hypertension Mother   . Arthritis Mother   . Lung cancer Sister   . Deep vein thrombosis Father   . Early death Brother        Objective: There were no vitals filed for this visit.   Physical Exam Vitals reviewed.  Constitutional:      Appearance: Normal appearance.  Cardiovascular:     Rate and Rhythm: Normal rate.     Heart sounds: Murmur heard.   Pulmonary:     Effort: Pulmonary effort is normal. No respiratory distress.     Breath sounds: Normal breath sounds.  Abdominal:     General: Abdomen is flat.     Palpations: Abdomen is soft.  Genitourinary:    Penis: Normal.   Skin:    General: Skin is warm and dry.  Neurological:     General: No focal deficit present.     Mental Status: He is alert and oriented to person, place, and time.     Lab Results:  No results found. However, due to the size of the patient record, not all encounters were searched. Please check Results Review for a complete set of results.  BMET No results for input(s): NA, K, CL, CO2, GLUCOSE, BUN, CREATININE, CALCIUM in the last 72 hours. PSA PSA  Date Value Ref Range Status  04/11/2015 1.08 0.00 - 4.00 ng/mL Final    Comment:    (NOTE) While PSA levels of <=4.0 ng/ml are reported as reference range, some men with levels below 4.0 ng/ml can have prostate cancer and many men with PSA above 4.0 ng/ml do not have prostate cancer.  Other tests such as free PSA, age specific reference ranges, PSA velocity and PSA doubling time may be helpful especially in men less than 79 years old. Performed at [facility]   12/25/2010 1.49 <=4.00 ng/mL Final    Comment:    Test Methodology: ECLIA PSA (Electrochemiluminescence Immunoassay)   No results found for: TESTOSTERONE    Studies/Results: Procedure: Cystoscopy.  He was prepped with betadine and the urethra was instilled with 2%  lidocaine jelly.  Cipro 500mg  po was given.  Cystoscopy was done with the flexible scope.  The urethral is normal.  The external sphincter is intact.  There is a papillary tumor at the right prostatic apex that appears to be about 1.5cm.  There is some right prostatic regrowth.  There is a 2-3 cm tumor hanging from the bladder neck.  There are multiple small tumors on the dome.  The UO's were not seen.  There is moderate trabeculation.  There were no complications.     Assessment & Plan: History of urothelial carcinoma of the prostate and urethra now with recurrences in the bladder and prostatic urethra.    I will get cardiac clearance for him to hold the Eliquis and will plan on cystoscopy with bilateral RTG's, TURBT and instillation of Gemcitabine.   I have reviewed the risks in detail.      Meds ordered this encounter  Medications  . ciprofloxacin (CIPRO) tablet 500 mg     Orders Placed This Encounter  Procedures  . Urinalysis, Routine w reflex microscopic      Return in about 3 months (around 05/10/2020) for Cystoscopy.   CC: Dettinger, Fransisca Kaufmann, MD  [name] 02/08/2020

## 2020-02-08 ENCOUNTER — Encounter: Payer: Self-pay | Admitting: Urology

## 2020-02-08 ENCOUNTER — Other Ambulatory Visit: Payer: Self-pay | Admitting: Urology

## 2020-02-08 ENCOUNTER — Other Ambulatory Visit: Payer: Self-pay

## 2020-02-08 ENCOUNTER — Ambulatory Visit (INDEPENDENT_AMBULATORY_CARE_PROVIDER_SITE_OTHER): Payer: Medicare Other | Admitting: Urology

## 2020-02-08 ENCOUNTER — Telehealth: Payer: Self-pay | Admitting: Cardiology

## 2020-02-08 ENCOUNTER — Other Ambulatory Visit: Payer: Self-pay | Admitting: *Deleted

## 2020-02-08 VITALS — BP 134/88 | HR 78 | Temp 98.5°F | Ht 66.0 in | Wt 137.0 lb

## 2020-02-08 DIAGNOSIS — C675 Malignant neoplasm of bladder neck: Secondary | ICD-10-CM

## 2020-02-08 DIAGNOSIS — C680 Malignant neoplasm of urethra: Secondary | ICD-10-CM | POA: Diagnosis not present

## 2020-02-08 DIAGNOSIS — Z8551 Personal history of malignant neoplasm of bladder: Secondary | ICD-10-CM

## 2020-02-08 DIAGNOSIS — C671 Malignant neoplasm of dome of bladder: Secondary | ICD-10-CM

## 2020-02-08 DIAGNOSIS — N138 Other obstructive and reflux uropathy: Secondary | ICD-10-CM

## 2020-02-08 MED ORDER — CIPROFLOXACIN HCL 500 MG PO TABS
500.0000 mg | ORAL_TABLET | Freq: Once | ORAL | Status: AC
  Administered 2020-02-08: 500 mg via ORAL

## 2020-02-08 MED ORDER — GABAPENTIN 400 MG PO CAPS: 400.0000 mg | ORAL_CAPSULE | Freq: Two times a day (BID) | ORAL | 5 refills | Status: DC

## 2020-02-08 NOTE — Telephone Encounter (Signed)
   [facility] Pre-operative Risk Assessment    HEARTCARE STAFF: - Please ensure there is not already an duplicate clearance open for this procedure. - Under Visit Info/Reason for Call, type in Other and utilize the format Clearance MM/DD/YY or Clearance TBD. Do not use dashes or single digits. - If request is for dental extraction, please clarify the # of teeth to be extracted.  Request for surgical clearance:  1. What type of surgery is being performed? transurethral resection of bladder tumor  2. When is this surgery scheduled? 03/04/20   3. What type of clearance is required (medical clearance vs. Pharmacy clearance to hold med vs. Both)? Both   Are there any medications that need to be held prior to surgery and how long? apixaban (ELIQUIS) 2.5 MG TABS tablet [[id number]] 4.   5. Practice name and name of physician performing surgery?  Dr Lelon Huh [facility]   6. What is the office phone number? [place]   7.   What is the office fax number? [phone]  8.   Anesthesia type (None, local, MAC, general) ? General    Shana A Stovall 02/08/2020, 4:13 PM  _________________________________________________________________   (provider comments below)

## 2020-02-08 NOTE — Progress Notes (Signed)
Urological Symptom Review  Patient is experiencing the following symptoms: Get up at night to urinate Blood in urine   Review of Systems  Gastrointestinal (upper)  : Negative for upper GI symptoms  Gastrointestinal (lower) : Negative for lower GI symptoms  Constitutional : Negative for symptoms  Skin: Negative for skin symptoms  Eyes: Negative for eye symptoms  Ear/Nose/Throat : Negative for Ear/Nose/Throat symptoms  Hematologic/Lymphatic: Negative for Hematologic/Lymphatic symptoms  Cardiovascular : Negative for cardiovascular symptoms  Respiratory : Negative for respiratory symptoms  Endocrine: Negative for endocrine symptoms  Musculoskeletal: Negative for musculoskeletal symptoms  Neurological: Negative for neurological symptoms  Psychologic: Negative for psychiatric symptoms  

## 2020-02-11 NOTE — Telephone Encounter (Signed)
Called the number listed for the patient and spoke with care giver [name] and informed him that the patient would need an office visit for cardiac clearance for his upcoming procedure. Mr. [name] called the patient's son [name] to inform him what was going on and to make sure it was ok for the appointment to be made. [name] is scheduled for an in office visit with [name], NP for Wednesday 05/06/19 at 11:15 AM. Mr. [name] and Mr. [name] were also informed that I will be calling the office that will be doing the procedure to inform them that an office appointment was needed for cardiac clearance. Both thanked me for calling and verbalized understanding.

## 2020-02-11 NOTE — Telephone Encounter (Signed)
Called the requesting office of [facility] and left a voice message for Pam the surgery scheduler about the patient would need an office visit for cardiac clearance and that he is scheduled for 03/05/20 at 11:15 AM with [name], NP and if she has any questions to give our office a call.

## 2020-02-11 NOTE — Telephone Encounter (Signed)
   Primary Cardiologist: Minus Breeding, MD  Chart reviewed as part of pre-operative protocol coverage. Because of Sohil Timko Cancro's past medical history and time since last visit, he will require a follow-up visit in order to better assess preoperative cardiovascular risk.  Pre-op covering staff: - Please schedule appointment and call patient to inform them. If patient already had an upcoming appointment within acceptable timeframe, please add "pre-op clearance" to the appointment notes so provider is aware. - Please contact requesting surgeon's office via preferred method (i.e, phone, fax) to inform them of need for appointment prior to surgery.  If applicable, this message will also be routed to pharmacy pool and/or primary cardiologist for input on holding anticoagulant/antiplatelet agent as requested below so that this information is available to the clearing provider at time of patient's appointment.   Almyra Deforest, [place]  02/11/2020, 9:36 AM

## 2020-02-11 NOTE — Telephone Encounter (Signed)
Patient rescheduled for virtual visit with Almyra Deforest, PA-C for 02/19/20

## 2020-02-11 NOTE — Telephone Encounter (Signed)
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    Procedure: transurethral resection of bladder tumor Date of procedure: 03/04/20  CHA2DS2-VASc Score = 7  This indicates a 11.2% annual risk of stroke. The patient's score is based upon: CHF History: 1 HTN History: 1 Diabetes History: 0 Stroke History: 2 (recurrent VTE) Vascular Disease History: 1 Age Score: 2 Gender Score: 0  CrCl 35mL/min Platelet count 185K  Typically would recommend holding Eliquis for 2 days prior to bladder resection. Given history of recurrent VTE, will defer to managing MD for final input.

## 2020-02-13 DIAGNOSIS — I130 Hypertensive heart and chronic kidney disease with heart failure and stage 1 through stage 4 chronic kidney disease, or unspecified chronic kidney disease: Secondary | ICD-10-CM | POA: Diagnosis not present

## 2020-02-13 DIAGNOSIS — J9621 Acute and chronic respiratory failure with hypoxia: Secondary | ICD-10-CM | POA: Diagnosis not present

## 2020-02-13 DIAGNOSIS — J449 Chronic obstructive pulmonary disease, unspecified: Secondary | ICD-10-CM | POA: Diagnosis not present

## 2020-02-13 DIAGNOSIS — I5042 Chronic combined systolic (congestive) and diastolic (congestive) heart failure: Secondary | ICD-10-CM | POA: Diagnosis not present

## 2020-02-13 DIAGNOSIS — I5021 Acute systolic (congestive) heart failure: Secondary | ICD-10-CM | POA: Diagnosis not present

## 2020-02-13 DIAGNOSIS — N1831 Chronic kidney disease, stage 3a: Secondary | ICD-10-CM | POA: Diagnosis not present

## 2020-02-15 ENCOUNTER — Other Ambulatory Visit: Payer: Self-pay | Admitting: Family Medicine

## 2020-02-17 ENCOUNTER — Emergency Department (HOSPITAL_COMMUNITY): Payer: Medicare Other

## 2020-02-17 ENCOUNTER — Encounter (HOSPITAL_COMMUNITY): Payer: Self-pay

## 2020-02-17 ENCOUNTER — Emergency Department (HOSPITAL_COMMUNITY)
Admission: EM | Admit: 2020-02-17 | Discharge: 2020-02-17 | Disposition: A | Discharge status: Home / Self Care | Payer: Medicare Other | Attending: Emergency Medicine | Admitting: Emergency Medicine

## 2020-02-17 ENCOUNTER — Other Ambulatory Visit: Payer: Self-pay

## 2020-02-17 DIAGNOSIS — Z20822 Contact with and (suspected) exposure to covid-19: Secondary | ICD-10-CM | POA: Insufficient documentation

## 2020-02-17 DIAGNOSIS — I4891 Unspecified atrial fibrillation: Secondary | ICD-10-CM | POA: Diagnosis not present

## 2020-02-17 DIAGNOSIS — R7989 Other specified abnormal findings of blood chemistry: Secondary | ICD-10-CM

## 2020-02-17 DIAGNOSIS — I517 Cardiomegaly: Secondary | ICD-10-CM | POA: Diagnosis not present

## 2020-02-17 DIAGNOSIS — Z87891 Personal history of nicotine dependence: Secondary | ICD-10-CM | POA: Insufficient documentation

## 2020-02-17 DIAGNOSIS — G309 Alzheimer's disease, unspecified: Secondary | ICD-10-CM | POA: Insufficient documentation

## 2020-02-17 DIAGNOSIS — I110 Hypertensive heart disease with heart failure: Secondary | ICD-10-CM | POA: Insufficient documentation

## 2020-02-17 DIAGNOSIS — R4182 Altered mental status, unspecified: Secondary | ICD-10-CM | POA: Diagnosis not present

## 2020-02-17 DIAGNOSIS — Z8551 Personal history of malignant neoplasm of bladder: Secondary | ICD-10-CM | POA: Diagnosis not present

## 2020-02-17 DIAGNOSIS — R310 Gross hematuria: Secondary | ICD-10-CM | POA: Diagnosis not present

## 2020-02-17 DIAGNOSIS — I5042 Chronic combined systolic (congestive) and diastolic (congestive) heart failure: Secondary | ICD-10-CM | POA: Diagnosis not present

## 2020-02-17 DIAGNOSIS — I2510 Atherosclerotic heart disease of native coronary artery without angina pectoris: Secondary | ICD-10-CM | POA: Diagnosis not present

## 2020-02-17 DIAGNOSIS — R419 Unspecified symptoms and signs involving cognitive functions and awareness: Secondary | ICD-10-CM | POA: Diagnosis present

## 2020-02-17 DIAGNOSIS — J449 Chronic obstructive pulmonary disease, unspecified: Secondary | ICD-10-CM | POA: Diagnosis not present

## 2020-02-17 DIAGNOSIS — R55 Syncope and collapse: Secondary | ICD-10-CM | POA: Diagnosis not present

## 2020-02-17 DIAGNOSIS — I10 Essential (primary) hypertension: Secondary | ICD-10-CM | POA: Diagnosis not present

## 2020-02-17 DIAGNOSIS — R404 Transient alteration of awareness: Secondary | ICD-10-CM | POA: Diagnosis not present

## 2020-02-17 DIAGNOSIS — J9811 Atelectasis: Secondary | ICD-10-CM | POA: Diagnosis not present

## 2020-02-17 LAB — URINALYSIS, ROUTINE W REFLEX MICROSCOPIC
Bilirubin Urine: NEGATIVE
Glucose, UA: NEGATIVE mg/dL
Ketones, ur: NEGATIVE mg/dL
Leukocytes,Ua: NEGATIVE
Nitrite: NEGATIVE
Protein, ur: 100 mg/dL — AB
RBC / HPF: >50 RBC/hpf — ABNORMAL HIGH (ref 0–5)
Specific Gravity, Urine: 1.013 (ref 1.005–1.030)
pH: 7 (ref 5.0–8.0)

## 2020-02-17 LAB — CBC WITH DIFFERENTIAL/PLATELET
Abs Immature Granulocytes: 0.01 10*3/uL (ref 0.00–0.07)
Basophils Absolute: 0 10*3/uL (ref 0.0–0.1)
Basophils Relative: 1 %
Eosinophils Absolute: 0.3 10*3/uL (ref 0.0–0.5)
Eosinophils Relative: 7 %
HCT: 40.7 % (ref 39.0–52.0)
Hemoglobin: 12.1 g/dL — ABNORMAL LOW (ref 13.0–17.0)
Immature Granulocytes: 0 %
Lymphocytes Relative: 35 %
Lymphs Abs: 1.3 10*3/uL (ref 0.7–4.0)
MCH: 26.5 pg (ref 26.0–34.0)
MCHC: 29.7 g/dL — ABNORMAL LOW (ref 30.0–36.0)
MCV: 89.1 fL (ref 80.0–100.0)
Monocytes Absolute: 0.3 10*3/uL (ref 0.1–1.0)
Monocytes Relative: 7 %
Neutro Abs: 1.8 10*3/uL (ref 1.7–7.7)
Neutrophils Relative %: 50 %
Platelets: 190 10*3/uL (ref 150–400)
RBC: 4.57 MIL/uL (ref 4.22–5.81)
RDW: 16.8 % — ABNORMAL HIGH (ref 11.5–15.5)
WBC: 3.7 10*3/uL — ABNORMAL LOW (ref 4.0–10.5)
nRBC: 0 % (ref 0.0–0.2)

## 2020-02-17 LAB — BRAIN NATRIURETIC PEPTIDE: B Natriuretic Peptide: 1248 pg/mL — ABNORMAL HIGH (ref 0.0–100.0)

## 2020-02-17 LAB — COMPREHENSIVE METABOLIC PANEL
ALT: 10 U/L (ref 0–44)
AST: 16 U/L (ref 15–41)
Albumin: 3.9 g/dL (ref 3.5–5.0)
Alkaline Phosphatase: 136 U/L — ABNORMAL HIGH (ref 38–126)
Anion gap: 6 (ref 5–15)
BUN: 24 mg/dL — ABNORMAL HIGH (ref 8–23)
CO2: 33 mmol/L — ABNORMAL HIGH (ref 22–32)
Calcium: 9.5 mg/dL (ref 8.9–10.3)
Chloride: 104 mmol/L (ref 98–111)
Creatinine, Ser: 1.19 mg/dL (ref 0.61–1.24)
GFR, Estimated: 56 mL/min — ABNORMAL LOW (ref >60)
Glucose, Bld: 82 mg/dL (ref 70–99)
Potassium: 4.6 mmol/L (ref 3.5–5.1)
Sodium: 143 mmol/L (ref 135–145)
Total Bilirubin: 0.6 mg/dL (ref 0.3–1.2)
Total Protein: 7.2 g/dL (ref 6.5–8.1)

## 2020-02-17 LAB — RESPIRATORY PANEL BY RT PCR (FLU A&B, COVID)
Influenza A by PCR: NEGATIVE
Influenza B by PCR: NEGATIVE
SARS Coronavirus 2 by RT PCR: NEGATIVE

## 2020-02-17 LAB — LIPASE, BLOOD: Lipase: 29 U/L (ref 11–51)

## 2020-02-17 MED ORDER — FUROSEMIDE 10 MG/ML IJ SOLN
20.0000 mg | Freq: Once | INTRAMUSCULAR | Status: AC
  Administered 2020-02-17: 20 mg via INTRAVENOUS
  Filled 2020-02-17: qty 2

## 2020-02-17 MED ORDER — FUROSEMIDE 20 MG PO TABS: 20.0000 mg | ORAL_TABLET | Freq: Every evening | ORAL | 0 refills | Status: DC

## 2020-02-17 MED ORDER — SODIUM CHLORIDE 0.9 % IV BOLUS
500.0000 mL | Freq: Once | INTRAVENOUS | Status: AC
  Administered 2020-02-17: 500 mL via INTRAVENOUS

## 2020-02-17 NOTE — ED Notes (Signed)
Pt was informed that we need a urine specimen.  

## 2020-02-17 NOTE — ED Provider Notes (Signed)
Emergency Department Provider Note   I have reviewed the triage vital signs and the nursing notes.   HISTORY  Chief Complaint Altered Mental Status   HPI Scott [name] is a 86 y.o. male with past medical history reviewed below presents to the emergency department with increased mental fog and irritability over the past several days. He recently had cystoscopy for surveillance of his known bladder cancer with Dr. Jeffie Pollock. The patient's caregiver is at bedside and provides most of the history. States that since the procedure he has been a little bit more irritable with both his son and caregiver at bedside which is unusual for him. They have not recorded any fever. The caregiver has noticed that the past several urinations have been darker than normal but the patient has no difficulty voiding. He has had some urinary incontinence which is also relatively unusual. Patient denies any pain or other complaints. Caregiver states that he is also noticed that he has been up at night moaning but when asked if he is in pain he denies it. The caregiver and son are concerned that he may have developed a urinary tract infection or possibly that his fluid levels are up with his underlying CHF.  Level 5 caveat: AMS     Past Medical History:  Diagnosis Date  . Arthritis    "all over"  . Atrial fibrillation ([place])   . Benign localized prostatic hyperplasia with lower urinary tract symptoms (LUTS)   . Bradycardia 11/28/2015   Severe-HR in the 20s to 30s following intubation for urological procedure.  Marland Kitchen CAD- non obstructive disease by cath 3/14 cardiologist-  dr hochrein  . Chronic lower back pain   . COPD (chronic obstructive pulmonary disease) ([place])   . DDD (degenerative disc disease)   . Depression   . Dyspnea    w/ exertion and lying down (raise head)  . ETOH abuse   . GERD (gastroesophageal reflux disease)   . Glaucoma   . Glaucoma, both eyes   . History of bladder cancer urologist-- dr Jeffie Pollock     first dx 2012--  s/p TURBT's  . History of DVT of lower extremity 03/2013   bilateral   . History of pulmonary embolus (PE) 04/2013  . HTN (hypertension)   . Hyperlipidemia   . Hypertension   . LBBB (left bundle branch block)    chronic  . Non-ischemic cardiomyopathy ([place])    last echo 01-18-2017, ef 35-40%  . NSVT (nonsustained ventricular tachycardia) ([place])    a. NSVT 06/2012; NSVT also seen during 03/2013 adm. b. Med rx. Not candidate for ICD given adv age.  Marland Kitchen PAD (peripheral artery disease) ([place])   . 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 PVCs (premature ventricular contractions)   . PVD (peripheral vascular disease) (H[facility])    Right ABI .75, Left .78 (2006)  . Syncope 06/2012   a. Felt to be postural syncope related to diuretics 06/2012.  Marland Kitchen Systolic and diastolic CHF, chronic (HCC) cardiologsit-  dr hochrein   a. NICM - patent cors 06/2012, EF 40% at that time. b. 03/2013 eval: EF 20-25%.  . Vision loss, left eye    due to glaucoma  . Wears glasses     Patient Active Problem List   Diagnosis Date Noted  . Hematuria 12/21/2019  . Supratherapeutic INR 12/21/2019  . Acute exacerbation of CHF (congestive heart failure) ([place]) 12/20/2019  .  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 Acute respiratory failure with hypoxia ([place])   . Alzheimer disease ([place]) 09/07/2018  . Paroxysmal atrial fibrillation ([place]) 01/18/2017  . PVC (pulmonary venous congestion) 01/18/2017  . Thoracic aortic atherosclerosis ([place]) 11/26/2016  . Thrombocytopenia ([place]) 08/04/2016  . Essential hypertension 11/29/2015  . Bradycardia 11/28/2015  . Malignant neoplasm of lateral wall of urinary bladder ([place]) 07/10/2015  . Status post above-knee amputation of left lower extremity ([place]) 10/18/2014  . Tachyarrhythmia 10/05/2014  . Atherosclerotic PVD with ulceration ([place]) 10/03/2014  . PAD (peripheral artery disease)  ([place]) 09/24/2014  . History of non-ST elevation myocardial infarction (NSTEMI) 10/09/2013  . High risk medication use 05/17/2013  . Vitamin D deficiency 05/17/2013  . History of pulmonary embolism (2014) 04/17/2013  . NSVT (nonsustained ventricular tachycardia)- not an ICD candidate 04/17/2013  . NICM (nonischemic cardiomyopathy)- EF 20-25% by Echo 04/12/13 04/17/2013  . Chronic combined systolic and diastolic heart failure ([place]) 03/15/2013  . DVT of lower extremity, bilateral ([place]) 03/15/2013  . Constipation 11/02/2012  . Syncope 06/21/2012  . Benign localized hyperplasia of prostate with urinary obstruction 03/15/2012  . Hyperlipidemia   . PVCs (premature ventricular contractions)   . CAD- non obstructive disease by cath 3/14     Past Surgical History:  Procedure Laterality Date  . AMPUTATION Left 10/16/2014   Procedure: AMPUTATION ABOVE KNEE- LEFT;  Surgeon: Mal Misty, MD;  Location: [facility];  Service: Vascular;  Laterality: Left;  . CARDIAC CATHETERIZATION  05-11-2004  DR [facility]   MINIMAL CORANARY PLAQUE/ NORMAL LVF/ EF 55%/  NON-OBSTRUCTIVE LAD 25%  . CARDIAC CATHETERIZATION  06/18/2012   dr hochrein   mild luminal irregularities of coronaries, ef 45-50%  . CARDIOVASCULAR STRESS TEST  10-15-2010   LOW RISK NUCLEAR STUDY/ NO EVIDENCE OF ISCHEMIA/ NORMAL EF  . CATARACT EXTRACTION W/ INTRAOCULAR LENS  IMPLANT, BILATERAL Bilateral   . CYSTOSCOPY  10/07/2011   Procedure: CYSTOSCOPY;  Surgeon: Malka So, MD;  Location: [facility];  Service: Urology;  Laterality: N/A;  . CYSTOSCOPY N/A 10/20/2018   Procedure: CYSTOSCOPY WITH FULGURATION OF PROSTATIC URETHRAL TUMOR;  Surgeon: [name], MD;  Location: [facility];  Service: Urology;  Laterality: N/A;  . CYSTOSCOPY N/A 10/11/2019   Procedure: CYSTOSCOPY;  Surgeon: Cleon Gustin, MD;  Location: [facility];  Service: Urology;  Laterality: N/A;  . CYSTOSCOPY WITH BIOPSY  03/14/2012   Procedure: CYSTOSCOPY WITH BIOPSY;   Surgeon: Malka So, MD;  Location: [facility];  Service: Urology;  Laterality: N/A;  WITH FULGURATION  . CYSTOSCOPY WITH BIOPSY N/A 12/09/2016   Procedure: CYSTOSCOPY WITH BIOPSY AND FULGURATION;  Surgeon: [name], MD;  Location: [facility];  Service: Urology;  Laterality: N/A;  . CYSTOSCOPY WITH BIOPSY N/A 12/20/2017   Procedure: CYSTOSCOPY WITH BIOPSY WITH FULGURATION;  Surgeon: [name], MD;  [facility];  Service: Urology;  Laterality: N/A;  . CYSTOSCOPY WITH BIOPSY N/A 06/09/2018   Procedure: CYSTOSCOPY WITH BIOPSY AND FULGURATION;  Surgeon: [name], MD;  Location[facility];  Service: Urology;  Laterality: N/A;  . CYSTOSCOPY WITH FULGERATION N/A 01/20/2016   Procedure: CYSTOSCOPY, BIOPSY WITH FULGERATION OF URTHRAL TUMOR;  Surgeon: [name], MD;  [facility];  Service: Urology;  Laterality: N/A;  . CYSTOSCOPY WITH FULGERATION N/A 06/01/2016   Procedure: CYSTOSCOPY WITH FULGERATION urethral tumors and bladder neck;  Surgeon: [name], MD;  Location[facility];  Service: Urology;  Laterality: N/A;  . SHOULDER SURGERY Left 1970's  .  TONSILLECTOMY    . TRANSTHORACIC ECHOCARDIOGRAM  01-18-2017   dr hochrein   moderate LVH, ef 35-40%, diffuse hypokinesis/  trivial AR and MR/  mild TR  . TRANSURETHRAL RESECTION OF BLADDER TUMOR  10/07/2011   Procedure: TRANSURETHRAL RESECTION OF BLADDER TUMOR (TURBT);  Surgeon: Malka So, MD;  Location[facility];  Service: Urology;  Laterality: N/A;  . TRANSURETHRAL RESECTION OF BLADDER TUMOR N/A 07/10/2015   Procedure: TRANSURETHRAL RESECTION OF BLADDER TUMOR (TURBT), CYSTOSCOPY ;  Surgeon: [name], MD;  [facility];  Service: Urology;  Laterality: N/A;  . TRANSURETHRAL RESECTION OF BLADDER TUMOR N/A 10/11/2019   Procedure: TRANSURETHRAL RESECTION OF PROSTATIC URETHRAL TUMOR;  Surgeon: Cleon Gustin, MD;  Location: [facility];  Service: Urology;  Laterality: N/A;  . TRANSURETHRAL RESECTION OF PROSTATE  03/14/2012   Procedure:  TRANSURETHRAL RESECTION OF THE PROSTATE WITH GYRUS INSTRUMENTS;  Surgeon: Malka So, MD;  Location[facility];  Service: Urology;  Laterality: N/A;    Allergies Rocephin [ceftriaxone sodium in dextrose] and Lipitor [atorvastatin]  Family History  Problem Relation Age of Onset  . Hypertension Mother   . Arthritis Mother   . Lung cancer Sister   . Deep vein thrombosis Father   . Early death Brother     Social History Social History   Tobacco Use  . Smoking status: Former Smoker    Packs/day: 1.00    Years: 35.00    Pack years: 35.00    Types: Cigarettes    Start date: 04/12/1942    Quit date: 04/13/1975    Years since quitting: 44.8  . Smokeless tobacco: Never Used  Vaping Use  . Vaping Use: Never used  Substance Use Topics  . Alcohol use: Not Currently    Alcohol/week: 7.0 standard drinks    Types: 7 Cans of beer per week  . Drug use: No    Review of Systems  Level 5 caveat: AMS   ____________________________________________   PHYSICAL EXAM:  VITAL SIGNS: ED Triage Vitals  Enc Vitals Group     BP 02/17/20 1124 (!) 170/84     Pulse Rate 02/17/20 1124 72     Resp 02/17/20 1124 10     Temp 02/17/20 1125 98.7 F (37.1 C)     Temp Source 02/17/20 1124 Oral     SpO2 02/17/20 1124 90 %     Weight 02/17/20 1120 140 lb (63.5 kg)     Height 02/17/20 1120 5\' 6"  (1.676 m)   Constitutional: Alert. Well appearing and in no acute distress. Eyes: Conjunctivae are normal.  Head: Atraumatic. Nose: No congestion/rhinnorhea. Mouth/Throat: Mucous membranes are slightly dry.  Neck: No stridor.   Cardiovascular: Normal rate, regular rhythm. Good peripheral circulation. Grossly normal heart sounds.   Respiratory: Normal respiratory effort.  No retractions. Lungs CTAB. Gastrointestinal: Soft with no area of focal tenderness, rebound, or guarding. No distention.  Musculoskeletal: No lower extremity tenderness nor edema. Left AKA baseline.  Neurologic:  Normal speech and  language. No gross focal neurologic deficits are appreciated. Moving all extremities equally and following commands. 5/5 strength in the upper and LLE.  Skin:  Skin is warm, dry and intact. No rash noted.  ____________________________________________   LABS (all labs ordered are listed, but only abnormal results are displayed)  Labs Reviewed  URINALYSIS, ROUTINE W REFLEX MICROSCOPIC - Abnormal; Notable for the following components:      Result Value   APPearance CLOUDY (*)    Hgb urine dipstick  LARGE (*)    Protein, ur 100 (*)    RBC / HPF >50 (*)    Bacteria, UA FEW (*)    All other components within normal limits  COMPREHENSIVE METABOLIC PANEL - Abnormal; Notable for the following components:   CO2 33 (*)    BUN 24 (*)    Alkaline Phosphatase 136 (*)    GFR, Estimated 56 (*)    All other components within normal limits  CBC WITH DIFFERENTIAL/PLATELET - Abnormal; Notable for the following components:   WBC 3.7 (*)    Hemoglobin 12.1 (*)    MCHC 29.7 (*)    RDW 16.8 (*)    All other components within normal limits  BRAIN NATRIURETIC PEPTIDE - Abnormal; Notable for the following components:   B Natriuretic Peptide 1,248.0 (*)    All other components within normal limits  RESPIRATORY PANEL BY RT PCR (FLU A&B, COVID)  URINE CULTURE  LIPASE, BLOOD   ____________________________________________  EKG   EKG Interpretation  Date/Time:  Sunday February 17 2020 11:22:32 EST Ventricular Rate:  72 PR Interval:    QRS Duration: 144 QT Interval:  435 QTC Calculation: 477 R Axis:   162 Text Interpretation: Sinus or ectopic atrial rhythm Prolonged PR interval Consider left atrial enlargement Consider left ventricular hypertrophy Borderline prolonged QT interval No STEMI Confirmed by Nanda Quinton [phone]) on 02/17/2020 11:39:18 AM       ____________________________________________  RADIOLOGY  CXR reviewed.   ____________________________________________   PROCEDURES  Procedure(s) performed:   Procedures  None  ____________________________________________   INITIAL IMPRESSION / ASSESSMENT AND PLAN / ED COURSE  Pertinent labs & imaging results that were available during my care of the patient were reviewed by me and considered in my medical decision making (see chart for details).   Patient presents to the emergency department for evaluation of some mental fog and increased irritability along with dark urination and urine incontinence over the past several days. Reviewed the cystoscopy note from Dr. Jeffie Pollock. The procedure was without immediate complication. The patient is overall well-appearing with largely unremarkable vital signs. Will perform screening lab work, chest x-ray, respiratory panel, CT imaging of the head. Plan for gentle IV fluid bolus while waiting on labs and reassess. Unclear if this represents urinary tract infection, developing sepsis, pulmonary edema, respiratory infection, metastatic disease to the brain, etc.   Labs and imaging reviewed. BNP elevated. No hypoxemia or incresed WOB here. Plan for IV lasix and home with extra PM dose x 2 days. Will send UA for culture. Discussed strict ED return precautions and close PCP follow up plan.  ____________________________________________  FINAL CLINICAL IMPRESSION(S) / ED DIAGNOSES  Final diagnoses:  Transient alteration of awareness  Elevated brain natriuretic peptide (BNP) level  Gross hematuria     MEDICATIONS GIVEN DURING THIS VISIT:  Medications  sodium chloride 0.9 % bolus 500 mL (0 mLs Intravenous Stopped 02/17/20 1306)  furosemide (LASIX) injection 20 mg (20 mg Intravenous Given 02/17/20 1404)     NEW OUTPATIENT MEDICATIONS STARTED DURING THIS VISIT:  Discharge Medication List as of 02/17/2020  1:49 PM    START taking these medications   Details  !! furosemide (LASIX) 20 MG tablet Take 1 tablet (20 mg total)  by mouth at bedtime for 2 days., Starting Mon 02/18/2020, Until Wed 02/20/2020, Normal     !! - Potential duplicate medications found. Please discuss with provider.      Note:  This document was prepared using Systems analyst  and may include unintentional dictation errors.  Nanda Quinton, MD, [facility] Emergency Medicine    Jaecion Dempster, Wonda [facility], MD 02/18/20 [phone]

## 2020-02-17 NOTE — Discharge Instructions (Signed)
You were seen in the emergency room today with some change in mental status.  We found that the fluid labs (BNP) were elevated.  The chest x-ray does not show any sign of pneumonia or severe pulmonary edema.  The urine test did not show obvious sign of infection.  There is blood in the urine and I am sending this for culture.  If it grows bacteria we will call to have you start antibiotics at that time.  Please follow closely with your primary care doctor as well as your urologist.  We have given an extra dose of Lasix for today.  Starting tomorrow you can take an extra 20 mg tablet of Lasix in the evening to go along with the morning dose of Lasix.  We are going to do this for only 2 days to see if this improves symptoms.  Please follow closely with your primary care doctor for repeat blood work as needed and to schedule a follow up appointment.

## 2020-02-17 NOTE — ED Triage Notes (Signed)
Pt's nephew is his caregiver and he reports for the past week pt has been a little aggressive, foggy, and irritable.  Reports severe fatigue yesterday.  Also reports has bladder cancer and has intermittent blood in his urine.  Reports blood darker than usual and looks like coffee.  Reports temp 99.6 and had tylenol.  Denies any pain but reports has been moaning.  Caregiver reports concern for UTI.

## 2020-02-19 ENCOUNTER — Telehealth: Payer: Self-pay

## 2020-02-19 ENCOUNTER — Telehealth (INDEPENDENT_AMBULATORY_CARE_PROVIDER_SITE_OTHER): Payer: Medicare Other | Admitting: Physician Assistant

## 2020-02-19 ENCOUNTER — Encounter: Payer: Self-pay | Admitting: Physician Assistant

## 2020-02-19 VITALS — Ht 61.0 in | Wt 131.0 lb

## 2020-02-19 DIAGNOSIS — I5042 Chronic combined systolic (congestive) and diastolic (congestive) heart failure: Secondary | ICD-10-CM | POA: Diagnosis not present

## 2020-02-19 DIAGNOSIS — J9621 Acute and chronic respiratory failure with hypoxia: Secondary | ICD-10-CM | POA: Diagnosis not present

## 2020-02-19 DIAGNOSIS — J449 Chronic obstructive pulmonary disease, unspecified: Secondary | ICD-10-CM | POA: Diagnosis not present

## 2020-02-19 DIAGNOSIS — I480 Paroxysmal atrial fibrillation: Secondary | ICD-10-CM | POA: Diagnosis not present

## 2020-02-19 DIAGNOSIS — N1831 Chronic kidney disease, stage 3a: Secondary | ICD-10-CM | POA: Diagnosis not present

## 2020-02-19 DIAGNOSIS — I10 Essential (primary) hypertension: Secondary | ICD-10-CM

## 2020-02-19 DIAGNOSIS — I428 Other cardiomyopathies: Secondary | ICD-10-CM

## 2020-02-19 DIAGNOSIS — I447 Left bundle-branch block, unspecified: Secondary | ICD-10-CM | POA: Diagnosis not present

## 2020-02-19 DIAGNOSIS — I5021 Acute systolic (congestive) heart failure: Secondary | ICD-10-CM | POA: Diagnosis not present

## 2020-02-19 DIAGNOSIS — Z01810 Encounter for preprocedural cardiovascular examination: Secondary | ICD-10-CM | POA: Diagnosis not present

## 2020-02-19 DIAGNOSIS — I130 Hypertensive heart and chronic kidney disease with heart failure and stage 1 through stage 4 chronic kidney disease, or unspecified chronic kidney disease: Secondary | ICD-10-CM | POA: Diagnosis not present

## 2020-02-19 LAB — URINE CULTURE: Culture: <10000 — AB

## 2020-02-19 NOTE — Patient Instructions (Addendum)
Medication Instructions:   HOLD Eliquis for 2 days prior to procedure and to restart as soon as possible at the discretion of the surgeon.  *If you need a refill on your cardiac medications before your next appointment, please call your pharmacy*  Lab Work: NONE ordered at this time of appointment   If you have labs (blood work) drawn today and your tests are completely normal, you will receive your results only by: Marland Kitchen MyChart Message (if you have MyChart) OR . A paper copy in the mail If you have any lab test that is abnormal or we need to change your treatment, we will call you to review the results.  Testing/Procedures: NONE ordered at this time of appointment   Follow-Up: At [facility], you and your health needs are our priority.  As part of our continuing mission to provide you with exceptional heart care, we have created designated Provider Care Teams.  These Care Teams include your primary Cardiologist (physician) and Advanced Practice Providers (APPs -  Physician Assistants and Nurse Practitioners) who all work together to provide you with the care you need, when you need it.  Your next appointment:   3 month(s)  The format for your next appointment:   In Person  Provider:   Minus Breeding, MD  Other Instructions

## 2020-02-19 NOTE — Progress Notes (Signed)
Virtual Visit via Telephone Note   This visit type was conducted due to national recommendations for restrictions regarding the COVID-19 Pandemic (e.g. social distancing) in an effort to limit this patient's exposure and mitigate transmission in our community.  Due to his co-morbid illnesses, this patient is at least at moderate risk for complications without adequate follow up.  This format is felt to be most appropriate for this patient at this time.  The patient did not have access to video technology/had technical difficulties with video requiring transitioning to audio format only (telephone).  All issues noted in this document were discussed and addressed.  No physical exam could be performed with this format.  Please refer to the patient's chart for his  consent to telehealth for [facility].    Date:  02/19/2020   ID:  Scott [name], DOB 11-Sep-1921, MRN [id number] The patient was identified using 2 identifiers.  Patient Location: Home Provider Location: Home Office  PCP:  Dettinger, Fransisca Kaufmann, MD  Cardiologist:  Scott Breeding, MD  Electrophysiologist:  None   Evaluation Performed:  Follow-Up Visit  Chief Complaint:  Follow up  History of Present Illness:    Scott [name] is a 86 y.o. male with past medical history of NICM, PAF, hypertension, chronic LBBB, postural syncope related to diuretic, left AKA, EtOH abuse and DVT.  Echocardiogram obtained in 2014 showed EF 20 to 25%.  Previous cardiac catheterization in March 2014 showed patent coronary arteries.  Repeat echocardiogram by 2016 showed EF improved to 45%.  Echocardiogram obtained in August 2020 showed EF 30 to 40%, akinesis of the left ventricular apical anteroseptal wall, akinesis of the left ventricle basal mid inferior wall, RVSP 70 mmHg.  Repeat echocardiogram in March 2021 showed EF 35 to 40% RVSP 56.6 mmHg, mild MR, akinesis of basal inferior segment and hypokinesis of the mid inferior segment.  He was last seen  virtually by Scott [name] in December 2020 at which time he was stable.  He was switched to low-dose Eliquis on 01/11/2020 due to labile INR.  Patient underwent cystoscopy with transurethral resection of the bladder tumor by Scott [name] on 10/11/2019.  Since then, he has been admitted in September 2021 with progressive shortness of breath and fatigue.  He also had low oxygen saturation level and a hypoxic down to the 70s in the [name].  Covid test was negative.  Chest x-ray showed chronic cardiomegaly with minimal subsegmental bibasilar atelectasis.  BNP was elevated at more than 2000.  Chest x-ray did not show obvious pulmonary edema.  He underwent IV diuresis with improvement of symptoms.  Echocardiogram obtained during the admission on 12/21/2019 showed EF 35%, RVSP 25.1 mmHg, mild to moderately dilated left atrium, mild MR.  Patient presents today for preoperative clearance along with his family member.  He denies any recent chest pain or significant shortness of breath.  His breathing has been slowly improving.  According to his son, overall his mental status and the functional ability has gradually declined this year.  He was most recently seen in the [name] on 02/17/2020 for transient altered mental status.  Urinalysis showed few bacteria however culture is inconclusive.  BNP was borderline elevated at 1248, he has been instructed to increase the diuretic for 3 days before going back to the previous dose.  According to his son, he does not have any significant lower extremity edema.  Patient is clearly unable to accomplish 4 METS of activity and he is not a good candidate  for ischemic evaluation either given his advanced age.  Since he underwent a similar procedure in July 2021 and recovered okay, I think he should be cleared to proceed with upcoming urology procedure as well knowing he will be at least a moderate to high risk candidate for any surgery given his advanced age and comorbidities.  I have reviewed the  previous echocardiogram, since 2018, his ejection fraction has stayed around 35% and recent echocardiogram did not show significant change.  I did request for him to follow-up with Scott [name] in 3 months.  The patient does not have symptoms concerning for COVID-19 infection (fever, chills, cough, or new shortness of breath).    Past Medical History:  Diagnosis Date  . Arthritis    "all over"  . Atrial fibrillation ([place])   . Benign localized prostatic hyperplasia with lower urinary tract symptoms (LUTS)   . Bradycardia 11/28/2015   Severe-HR in the 20s to 30s following intubation for urological procedure.  Marland Kitchen CAD- non obstructive disease by cath 3/14 cardiologist-  dr Scott [name]  . Chronic lower back pain   . COPD (chronic obstructive pulmonary disease) ([place])   . DDD (degenerative disc disease)   . Depression   . Dyspnea    w/ exertion and lying down (raise head)  . ETOH abuse   . GERD (gastroesophageal reflux disease)   . Glaucoma   . Glaucoma, both eyes   . History of bladder cancer urologist-- dr Scott [name]   first dx 2012--  s/p TURBT's  . History of DVT of lower extremity 03/2013   bilateral   . History of pulmonary embolus (PE) 04/2013  . HTN (hypertension)   . Hyperlipidemia   . Hypertension   . LBBB (left bundle branch block)    chronic  . Non-ischemic cardiomyopathy ([place])    last echo 01-18-2017, ef 35-40%  . NSVT (nonsustained ventricular tachycardia) ([place])    a. NSVT 06/2012; NSVT also seen during 03/2013 adm. b. Med rx. Not candidate for ICD given adv age.  Marland Kitchen PAD (peripheral artery disease) ([place])   . 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 PVCs (premature ventricular contractions)   . PVD (peripheral vascular disease) (HCC)    Right ABI .75, Left .78 (2006)  . Syncope 06/2012   a. Felt to be postural syncope related to diuretics 06/2012.  Marland Kitchen Systolic and diastolic CHF, chronic  (HCC) cardiologsit-  dr Scott [name]   a. NICM - patent cors 06/2012, EF 40% at that time. b. 03/2013 eval: EF 20-25%.  . Vision loss, left eye    due to glaucoma  . Wears glasses    Past Surgical History:  Procedure Laterality Date  . AMPUTATION Left 10/16/2014   Procedure: AMPUTATION ABOVE KNEE- LEFT;  Surgeon: Mal Misty, MD;  Location: [place];  Service: Vascular;  Laterality: Left;  . CARDIAC CATHETERIZATION  05-11-2004  DR [facility]   MINIMAL CORANARY PLAQUE/ NORMAL LVF/ EF 55%/  NON-OBSTRUCTIVE LAD 25%  . CARDIAC CATHETERIZATION  06/18/2012   dr Scott [name]   mild luminal irregularities of coronaries, ef 45-50%  . CARDIOVASCULAR STRESS TEST  10-15-2010   LOW RISK NUCLEAR STUDY/ NO EVIDENCE OF ISCHEMIA/ NORMAL EF  . CATARACT EXTRACTION W/ INTRAOCULAR LENS  IMPLANT, BILATERAL Bilateral   . CYSTOSCOPY  10/07/2011   Procedure: CYSTOSCOPY;  Surgeon: Malka So, MD;  Location: [facility];  Service: Urology;  Laterality: N/A;  .  CYSTOSCOPY N/A 10/20/2018   Procedure: CYSTOSCOPY WITH FULGURATION OF PROSTATIC URETHRAL TUMOR;  Surgeon: [name], MD;  Location: [facility];  Service: Urology;  Laterality: N/A;  . CYSTOSCOPY N/A 10/11/2019   Procedure: CYSTOSCOPY;  Surgeon: Cleon Gustin, MD;  Location: [facility];  Service: Urology;  Laterality: N/A;  . CYSTOSCOPY WITH BIOPSY  03/14/2012   Procedure: CYSTOSCOPY WITH BIOPSY;  Surgeon: Malka So, MD;  Location: [facility];  Service: Urology;  Laterality: N/A;  WITH FULGURATION  . CYSTOSCOPY WITH BIOPSY N/A 12/09/2016   Procedure: CYSTOSCOPY WITH BIOPSY AND FULGURATION;  Surgeon: [name], MD;  Location: [facility];  Service: Urology;  Laterality: N/A;  . CYSTOSCOPY WITH BIOPSY N/A 12/20/2017   Procedure: CYSTOSCOPY WITH BIOPSY WITH FULGURATION;  Surgeon: [name], MD;  Location[facility];  Service: Urology;  Laterality: N/A;  . CYSTOSCOPY WITH BIOPSY N/A 06/09/2018   Procedure: CYSTOSCOPY WITH BIOPSY AND FULGURATION;  Surgeon: [name],  MD;  Location: [facility];  Service: Urology;  Laterality: N/A;  . CYSTOSCOPY WITH FULGERATION N/A 01/20/2016   Procedure: CYSTOSCOPY, BIOPSY WITH FULGERATION OF URTHRAL TUMOR;  Surgeon: [name], MD;  Location[facility];  Service: Urology;  Laterality: N/A;  . CYSTOSCOPY WITH FULGERATION N/A 06/01/2016   Procedure: CYSTOSCOPY WITH FULGERATION urethral tumors and bladder neck;  Surgeon: [name], MD;  Location[facility];  Service: Urology;  Laterality: N/A;  . SHOULDER SURGERY Left 1970's  . TONSILLECTOMY    . TRANSTHORACIC ECHOCARDIOGRAM  01-18-2017   dr Scott [name]   moderate LVH, ef 35-40%, diffuse hypokinesis/  trivial AR and MR/  mild TR  . TRANSURETHRAL RESECTION OF BLADDER TUMOR  10/07/2011   Procedure: TRANSURETHRAL RESECTION OF BLADDER TUMOR (TURBT);  Surgeon: Malka So, MD;  Location: [facility];  Service: Urology;  Laterality: N/A;  . TRANSURETHRAL RESECTION OF BLADDER TUMOR N/A 07/10/2015   Procedure: TRANSURETHRAL RESECTION OF BLADDER TUMOR (TURBT), CYSTOSCOPY ;  Surgeon: [name], MD;  [facility];  Service: Urology;  Laterality: N/A;  . TRANSURETHRAL RESECTION OF BLADDER TUMOR N/A 10/11/2019   Procedure: TRANSURETHRAL RESECTION OF PROSTATIC URETHRAL TUMOR;  Surgeon: Cleon Gustin, MD;  Location[facility];  Service: Urology;  Laterality: N/A;  . TRANSURETHRAL RESECTION OF PROSTATE  03/14/2012   Procedure: TRANSURETHRAL RESECTION OF THE PROSTATE WITH GYRUS INSTRUMENTS;  Surgeon: Malka So, MD;  [facility];  Service: Urology;  Laterality: N/A;     Current Meds  Medication Sig  . acetaminophen (TYLENOL) 325 MG tablet Take 2 tablets (650 mg total) by mouth every 6 (six) hours as needed for mild pain (or Fever >/= 101).  . Albuterol Sulfate 2.5 MG/0.5ML NEBU SMARTSIG:3 Milliliter(s) Via Nebulizer Every 6 Hours PRN  . apixaban (ELIQUIS) 2.5 MG TABS tablet Take 1 tablet (2.5 mg total) by mouth 2 (two) times daily.  . carvedilol (COREG) 6.25 MG tablet TAKE  (1)   TABLET TWICE A DAY.  Marland Kitchen donepezil (ARICEPT) 10 MG tablet TAKE 1 TABLET AT BEDTIME  . febuxostat (ULORIC) 40 MG tablet TAKE 1 TABLET DAILY  . fluticasone (FLONASE) 50 MCG/ACT nasal spray SPRAY 1 SPRAY IN EACH NOSTRIL ONCE DAILY. (Patient taking differently: Place 1 spray into both nostrils daily as needed for allergies or rhinitis. )  . furosemide (LASIX) 20 MG tablet TAKE (1) TABLET DAILY IN THE MORNING.  Marland Kitchen gabapentin (NEURONTIN) 400 MG capsule Take 1 capsule (400 mg total) by mouth 2 (two) times daily.  . Ipratropium-Albuterol (COMBIVENT RESPIMAT)  20-100 MCG/ACT AERS respimat Inhale 1 puff into the lungs every 6 (six) hours as needed for wheezing or shortness of breath (As needed for wheezing , cough or shortness of breath).  . MYRBETRIQ 25 MG TB24 tablet Take 1 tablet (25 mg total) by mouth in the morning and at bedtime.  . Nutritional Supplements (ENSURE NUTRITION SHAKE) LIQD Take 1 Bottle by mouth 4 (four) times daily. 1 bottle of chocolate flavored Ensure up to 4 times a day--give with ice-please (Patient taking differently: Take 1 Bottle by mouth daily as needed (When he does not eat well). )  . pantoprazole (PROTONIX) 40 MG tablet TAKE 1 TABLET DAILY  . sertraline (ZOLOFT) 50 MG tablet TAKE (1) TABLET DAILY IN THE MORNING.  . simvastatin (ZOCOR) 10 MG tablet TAKE 1 TABLET ONCE DAILY IN THE EVENING     Allergies:   Rocephin [ceftriaxone sodium in dextrose] and Lipitor [atorvastatin]   Social History   Tobacco Use  . Smoking status: Former Smoker    Packs/day: 1.00    Years: 35.00    Pack years: 35.00    Types: Cigarettes    Start date: 04/12/1942    Quit date: 04/13/1975    Years since quitting: 44.8  . Smokeless tobacco: Never Used  Vaping Use  . Vaping Use: Never used  Substance Use Topics  . Alcohol use: Not Currently    Alcohol/week: 7.0 standard drinks    Types: 7 Cans of beer per week  . Drug use: No     Family Hx: The patient's family history includes Arthritis in his  mother; Deep vein thrombosis in his father; Early death in his brother; Hypertension in his mother; Lung cancer in his sister.  ROS:   Please see the history of present illness.     All other systems reviewed and are negative.   Prior CV studies:   The following studies were reviewed today:  Echo 12/21/2019 1. Left ventricular ejection fraction, by estimation, is approximately  35%. The left ventricle has moderately decreased function. The left  ventricle demonstrates regional wall motion abnormalities (see scoring  diagram/findings for description). There is  moderate left ventricular hypertrophy. Left ventricular diastolic  parameters are indeterminate.  2. Right ventricular systolic function is moderately reduced. The right  ventricular size is normal. There is normal pulmonary artery systolic  pressure. The estimated right ventricular systolic pressure is 53.9 mmHg.  3. Left atrial size was mild to moderately dilated.  4. The mitral valve is abnormal. Mild mitral valve regurgitation.  5. The aortic valve is tricuspid. Aortic valve regurgitation is not  visualized. Mild to moderate aortic valve sclerosis/calcification is  present, without any evidence of aortic stenosis.  6. The inferior vena cava is normal in size with greater than 50%  respiratory variability, suggesting right atrial pressure of 3 mmHg.   Labs/Other Tests and Data Reviewed:    EKG:  An ECG dated 02/17/2020 was personally reviewed today and demonstrated:  Normal sinus rhythm with first-degree AV block.  Recent Labs: 12/23/2019: Magnesium 2.1 02/17/2020: ALT 10; B Natriuretic Peptide 1,248.0; BUN 24; Creatinine, Ser 1.19; Hemoglobin 12.1; Platelets 190; Potassium 4.6; Sodium 143   Recent Lipid Panel Lab Results  Component Value Date/Time   CHOL 115 12/19/2019 10:28 AM   TRIG 83 12/19/2019 10:28 AM   TRIG 194 (H) 03/26/2014 02:46 PM   HDL 42 12/19/2019 10:28 AM   HDL 35 (L) 03/26/2014 02:46 PM    CHOLHDL 2.7 12/19/2019 10:28 AM  CHOLHDL 2.6 11/02/2012 01:10 PM   LDLCALC 57 12/19/2019 10:28 AM   LDLCALC 42 05/17/2013 11:01 AM    Wt Readings from Last 3 Encounters:  02/19/20 131 lb (59.4 kg)  02/17/20 140 lb (63.5 kg)  02/08/20 137 lb (62.1 kg)     Risk Assessment/Calculations:     CHA2DS2-VASc Score = 7  This indicates a 11.2% annual risk of stroke. The patient's score is based upon: CHF History: 1 HTN History: 1 Diabetes History: 0 Stroke History: 2 (recurrent VTE) Vascular Disease History: 1 Age Score: 2 Gender Score: 0     Objective:    Vital Signs:  Ht 5\' 1"  (1.549 m)   Wt 131 lb (59.4 kg)   BMI 24.75 kg/m    VITAL SIGNS:  reviewed  ASSESSMENT & PLAN:    1. Preoperative clearance: Patient is scheduled for transurethral resection of bladder tumor with gemcitabine on 03/04/2020.  He previously underwent cystoscopy on 10/11/2019.  He is not a good candidate for any ischemic work-up.  Ejection fraction has been stable on the last several echocardiogram.  Most recent echocardiogram was performed in September 2021.  He does not have any significant shortness of breath at rest.  There has been a functional and mental decline over the year.  Given his comorbidities and his advanced age of 86 year old, he is at least a moderate to high risk patient for a relatively low to moderate risk procedure.  However, it is not completely prohibitive for the patient to proceed with the urology procedure.  He is cleared from cardiac perspective to proceed. He has been instructed to hold Eliquis for 2 days prior to the procedure and restart as soon as possible afterward.  2. Nonischemic cardiomyopathy: Ejection fraction stable around 35% based on the most recent echocardiogram  3. PAF: On Eliquis and carvedilol.  Most recent EKG demonstrated normal sinus rhythm  4. Hypertension: Continue on current therapy.  No vital sign was available to me for today's virtual visit  5. Chronic left  bundle branch block: Asymptomatic.  COVID-19 Education: The signs and symptoms of COVID-19 were discussed with the patient and how to seek care for testing (follow up with PCP or arrange E-visit).  The importance of social distancing was discussed today.  Time:   Today, I have spent 22 minutes with the patient with telehealth technology discussing the above problems.     Medication Adjustments/Labs and Tests Ordered: Current medicines are reviewed at length with the patient today.  Concerns regarding medicines are outlined above.   Tests Ordered: No orders of the defined types were placed in this encounter.   Medication Changes: No orders of the defined types were placed in this encounter.   Follow Up:  In Person in 3 month(s)  Signed, Almyra Deforest, [place]  02/19/2020 10:45 AM    [place]

## 2020-02-19 NOTE — Telephone Encounter (Signed)
  Patient Consent for Virtual Visit         Scott [name] has provided verbal consent on 02/19/2020 for a virtual visit (video or telephone).   CONSENT FOR VIRTUAL VISIT FOR:  Scott [name]  By participating in this virtual visit I agree to the following:  I hereby voluntarily request, consent and authorize [facility] and its employed or contracted physicians, physician assistants, nurse practitioners or other licensed health care professionals (the Practitioner), to provide me with telemedicine health care services (the "Services") as deemed necessary by the treating Practitioner. I acknowledge and consent to receive the Services by the Practitioner via telemedicine. I understand that the telemedicine visit will involve communicating with the Practitioner through live audiovisual communication technology and the disclosure of certain medical information by electronic transmission. I acknowledge that I have been given the opportunity to request an in-person assessment or other available alternative prior to the telemedicine visit and am voluntarily participating in the telemedicine visit.  I understand that I have the right to withhold or withdraw my consent to the use of telemedicine in the course of my care at any time, without affecting my right to future care or treatment, and that the Practitioner or I may terminate the telemedicine visit at any time. I understand that I have the right to inspect all information obtained and/or recorded in the course of the telemedicine visit and may receive copies of available information for a reasonable fee.  I understand that some of the potential risks of receiving the Services via telemedicine include:  Marland Kitchen Delay or interruption in medical evaluation due to technological equipment failure or disruption; . Information transmitted may not be sufficient (e.g. poor resolution of images) to allow for appropriate medical decision making by the Practitioner;  and/or  . In rare instances, security protocols could fail, causing a breach of personal health information.  Furthermore, I acknowledge that it is my responsibility to provide information about my medical history, conditions and care that is complete and accurate to the best of my ability. I acknowledge that Practitioner's advice, recommendations, and/or decision may be based on factors not within their control, such as incomplete or inaccurate data provided by me or distortions of diagnostic images or specimens that may result from electronic transmissions. I understand that the practice of medicine is not an exact science and that Practitioner makes no warranties or guarantees regarding treatment outcomes. I acknowledge that a copy of this consent can be made available to me via my patient portal ([facility]), or I can request a printed copy by calling the office of [facility].    I understand that my insurance will be billed for this visit.   I have read or had this consent read to me. . I understand the contents of this consent, which adequately explains the benefits and risks of the Services being provided via telemedicine.  . I have been provided ample opportunity to ask questions regarding this consent and the Services and have had my questions answered to my satisfaction. . I give my informed consent for the services to be provided through the use of telemedicine in my medical care

## 2020-02-20 ENCOUNTER — Other Ambulatory Visit: Payer: Self-pay | Admitting: Urology

## 2020-02-20 DIAGNOSIS — J449 Chronic obstructive pulmonary disease, unspecified: Secondary | ICD-10-CM | POA: Diagnosis not present

## 2020-02-20 DIAGNOSIS — I5021 Acute systolic (congestive) heart failure: Secondary | ICD-10-CM | POA: Diagnosis not present

## 2020-02-20 DIAGNOSIS — N138 Other obstructive and reflux uropathy: Secondary | ICD-10-CM

## 2020-02-20 DIAGNOSIS — I5042 Chronic combined systolic (congestive) and diastolic (congestive) heart failure: Secondary | ICD-10-CM | POA: Diagnosis not present

## 2020-02-20 DIAGNOSIS — I130 Hypertensive heart and chronic kidney disease with heart failure and stage 1 through stage 4 chronic kidney disease, or unspecified chronic kidney disease: Secondary | ICD-10-CM | POA: Diagnosis not present

## 2020-02-20 DIAGNOSIS — N1831 Chronic kidney disease, stage 3a: Secondary | ICD-10-CM | POA: Diagnosis not present

## 2020-02-20 DIAGNOSIS — N401 Enlarged prostate with lower urinary tract symptoms: Secondary | ICD-10-CM

## 2020-02-20 DIAGNOSIS — J9621 Acute and chronic respiratory failure with hypoxia: Secondary | ICD-10-CM | POA: Diagnosis not present

## 2020-02-22 DIAGNOSIS — Z86718 Personal history of other venous thrombosis and embolism: Secondary | ICD-10-CM | POA: Diagnosis not present

## 2020-02-22 DIAGNOSIS — J9621 Acute and chronic respiratory failure with hypoxia: Secondary | ICD-10-CM | POA: Diagnosis not present

## 2020-02-22 DIAGNOSIS — Z86711 Personal history of pulmonary embolism: Secondary | ICD-10-CM | POA: Diagnosis not present

## 2020-02-22 DIAGNOSIS — N400 Enlarged prostate without lower urinary tract symptoms: Secondary | ICD-10-CM | POA: Diagnosis not present

## 2020-02-22 DIAGNOSIS — Z7901 Long term (current) use of anticoagulants: Secondary | ICD-10-CM | POA: Diagnosis not present

## 2020-02-22 DIAGNOSIS — M1990 Unspecified osteoarthritis, unspecified site: Secondary | ICD-10-CM | POA: Diagnosis not present

## 2020-02-22 DIAGNOSIS — I5042 Chronic combined systolic (congestive) and diastolic (congestive) heart failure: Secondary | ICD-10-CM | POA: Diagnosis not present

## 2020-02-22 DIAGNOSIS — J449 Chronic obstructive pulmonary disease, unspecified: Secondary | ICD-10-CM | POA: Diagnosis not present

## 2020-02-22 DIAGNOSIS — Z9981 Dependence on supplemental oxygen: Secondary | ICD-10-CM | POA: Diagnosis not present

## 2020-02-22 DIAGNOSIS — K219 Gastro-esophageal reflux disease without esophagitis: Secondary | ICD-10-CM | POA: Diagnosis not present

## 2020-02-22 DIAGNOSIS — I4891 Unspecified atrial fibrillation: Secondary | ICD-10-CM | POA: Diagnosis not present

## 2020-02-22 DIAGNOSIS — C679 Malignant neoplasm of bladder, unspecified: Secondary | ICD-10-CM | POA: Diagnosis not present

## 2020-02-22 DIAGNOSIS — F1011 Alcohol abuse, in remission: Secondary | ICD-10-CM | POA: Diagnosis not present

## 2020-02-22 DIAGNOSIS — I5021 Acute systolic (congestive) heart failure: Secondary | ICD-10-CM | POA: Diagnosis not present

## 2020-02-22 DIAGNOSIS — I429 Cardiomyopathy, unspecified: Secondary | ICD-10-CM | POA: Diagnosis not present

## 2020-02-22 DIAGNOSIS — Z89612 Acquired absence of left leg above knee: Secondary | ICD-10-CM | POA: Diagnosis not present

## 2020-02-22 DIAGNOSIS — Z79899 Other long term (current) drug therapy: Secondary | ICD-10-CM | POA: Diagnosis not present

## 2020-02-22 DIAGNOSIS — Z8551 Personal history of malignant neoplasm of bladder: Secondary | ICD-10-CM | POA: Diagnosis not present

## 2020-02-22 DIAGNOSIS — R319 Hematuria, unspecified: Secondary | ICD-10-CM | POA: Diagnosis not present

## 2020-02-22 DIAGNOSIS — Z483 Aftercare following surgery for neoplasm: Secondary | ICD-10-CM | POA: Diagnosis not present

## 2020-02-22 DIAGNOSIS — J9611 Chronic respiratory failure with hypoxia: Secondary | ICD-10-CM | POA: Diagnosis not present

## 2020-02-22 DIAGNOSIS — F32A Depression, unspecified: Secondary | ICD-10-CM | POA: Diagnosis not present

## 2020-02-22 DIAGNOSIS — M519 Unspecified thoracic, thoracolumbar and lumbosacral intervertebral disc disorder: Secondary | ICD-10-CM | POA: Diagnosis not present

## 2020-02-22 DIAGNOSIS — R3915 Urgency of urination: Secondary | ICD-10-CM | POA: Diagnosis not present

## 2020-02-22 DIAGNOSIS — Z9181 History of falling: Secondary | ICD-10-CM | POA: Diagnosis not present

## 2020-02-22 DIAGNOSIS — I130 Hypertensive heart and chronic kidney disease with heart failure and stage 1 through stage 4 chronic kidney disease, or unspecified chronic kidney disease: Secondary | ICD-10-CM | POA: Diagnosis not present

## 2020-02-22 DIAGNOSIS — Z7951 Long term (current) use of inhaled steroids: Secondary | ICD-10-CM | POA: Diagnosis not present

## 2020-02-22 DIAGNOSIS — I739 Peripheral vascular disease, unspecified: Secondary | ICD-10-CM | POA: Diagnosis not present

## 2020-02-22 DIAGNOSIS — N1831 Chronic kidney disease, stage 3a: Secondary | ICD-10-CM | POA: Diagnosis not present

## 2020-02-22 DIAGNOSIS — E785 Hyperlipidemia, unspecified: Secondary | ICD-10-CM | POA: Diagnosis not present

## 2020-02-22 DIAGNOSIS — H409 Unspecified glaucoma: Secondary | ICD-10-CM | POA: Diagnosis not present

## 2020-02-22 DIAGNOSIS — F329 Major depressive disorder, single episode, unspecified: Secondary | ICD-10-CM | POA: Diagnosis not present

## 2020-02-22 DIAGNOSIS — I447 Left bundle-branch block, unspecified: Secondary | ICD-10-CM | POA: Diagnosis not present

## 2020-02-22 DIAGNOSIS — F0390 Unspecified dementia without behavioral disturbance: Secondary | ICD-10-CM | POA: Diagnosis not present

## 2020-02-22 DIAGNOSIS — Z791 Long term (current) use of non-steroidal anti-inflammatories (NSAID): Secondary | ICD-10-CM | POA: Diagnosis not present

## 2020-02-25 NOTE — Patient Instructions (Addendum)
DUE TO COVID-19 ONLY ONE VISITOR IS ALLOWED TO COME WITH YOU AND STAY IN THE WAITING ROOM ONLY DURING PRE OP AND PROCEDURE DAY OF SURGERY. THE 1 VISITOR  MAY VISIT WITH YOU AFTER SURGERY IN YOUR PRIVATE ROOM DURING VISITING HOURS ONLY!  YOU NEED TO HAVE A COVID 19 TEST ON: 02/29/20 @ 1:00 PM, THIS TEST MUST BE DONE BEFORE SURGERY,  COVID TESTING SITE [facility] [place] [id number], IT IS ON THE RIGHT GOING OUT [address] APPROXIMATELY  2 MINUTES PAST [employer] ON THE RIGHT. ONCE YOUR COVID TEST IS COMPLETED,  PLEASE BEGIN THE QUARANTINE INSTRUCTIONS AS OUTLINED IN YOUR HANDOUT.                [place]    Your procedure is scheduled on: 03/04/20   Report to [facility] Main  Entrance   Report to admitting at: 8:40 AM     Call this number if you have problems the morning of surgery [phone]    Remember: Do not eat solid food :After Midnight. Clear liquids from midnight until: 7:40 am.  CLEAR LIQUID DIET   Foods Allowed                                                                     Foods Excluded  Coffee and tea, regular and decaf                             liquids that you cannot  Plain Jell-O any favor except red or purple                                           see through such as: Fruit ices (not with fruit pulp)                                     milk, soups, orange juice  Iced Popsicles                                    All solid food Carbonated beverages, regular and diet                                    Cranberry, grape and apple juices Sports drinks like Gatorade Lightly seasoned clear broth or consume(fat free) Sugar, honey syrup  Sample Menu Breakfast                                Lunch                                     Supper Cranberry juice  Beef broth                            Chicken broth Jell-O                                     Grape juice                           Apple juice Coffee  or tea                        Jell-O                                      Popsicle                                                Coffee or tea                        Coffee or tea  _____________________________________________________________________  BRUSH YOUR TEETH MORNING OF SURGERY AND RINSE YOUR MOUTH OUT, NO CHEWING GUM CANDY OR MINTS.     Take these medicines the morning of surgery with A SIP OF WATER: carvedilol,gabapentin,myrbetriq,protonix,sertraline,uloric.Use flonase and inhalers as usual.                                You may not have any metal on your body including hair pins and              piercings  Do not wear jewelry, lotions, powders or perfumes, deodorant             Men may shave face and neck.   Do not bring valuables to the hospital. [place].  Contacts, dentures or bridgework may not be worn into surgery.  Leave suitcase in the car. After surgery it may be brought to your room.     Patients discharged the day of surgery will not be allowed to drive home. IF YOU ARE HAVING SURGERY AND GOING HOME THE SAME DAY, YOU MUST HAVE AN ADULT TO DRIVE YOU HOME AND BE WITH YOU FOR 24 HOURS. YOU MAY GO HOME BY TAXI OR UBER OR ORTHERWISE, BUT AN ADULT MUST ACCOMPANY YOU HOME AND STAY WITH YOU FOR 24 HOURS.  Name and phone number of your driver:  Special Instructions: N/A              Please read over the following fact sheets you were given: _____________________________________________________________________         [facility] - Preparing for Surgery Before surgery, you can play an important role.  Because skin is not sterile, your skin needs to be as free of germs as possible.  You can reduce the number of germs on your skin by washing with CHG (chlorahexidine gluconate) soap before surgery.  CHG is an antiseptic cleaner which kills germs and bonds with the  skin to continue killing germs even after washing. Please DO  NOT use if you have an allergy to CHG or antibacterial soaps.  If your skin becomes reddened/irritated stop using the CHG and inform your nurse when you arrive at Short Stay. Do not shave (including legs and underarms) for at least 48 hours prior to the first CHG shower.  You may shave your face/neck. Please follow these instructions carefully:  1.  Shower with CHG Soap the night before surgery and the  morning of Surgery.  2.  If you choose to wash your hair, wash your hair first as usual with your  normal  shampoo.  3.  After you shampoo, rinse your hair and body thoroughly to remove the  shampoo.                           4.  Use CHG as you would any other liquid soap.  You can apply chg directly  to the skin and wash                       Gently with a scrungie or clean washcloth.  5.  Apply the CHG Soap to your body ONLY FROM THE NECK DOWN.   Do not use on face/ open                           Wound or open sores. Avoid contact with eyes, ears mouth and genitals (private parts).                       Wash face,  Genitals (private parts) with your normal soap.             6.  Wash thoroughly, paying special attention to the area where your surgery  will be performed.  7.  Thoroughly rinse your body with warm water from the neck down.  8.  DO NOT shower/wash with your normal soap after using and rinsing off  the CHG Soap.                9.  Pat yourself dry with a clean towel.            10.  Wear clean pajamas.            11.  Place clean sheets on your bed the night of your first shower and do not  sleep with pets. Day of Surgery : Do not apply any lotions/deodorants the morning of surgery.  Please wear clean clothes to the hospital/surgery center.  FAILURE TO FOLLOW THESE INSTRUCTIONS MAY RESULT IN THE CANCELLATION OF YOUR SURGERY PATIENT SIGNATURE_________________________________  NURSE  SIGNATURE__________________________________  ________________________________________________________________________

## 2020-02-26 ENCOUNTER — Other Ambulatory Visit: Payer: Self-pay

## 2020-02-26 ENCOUNTER — Encounter (HOSPITAL_COMMUNITY)
Admission: RE | Admit: 2020-02-26 | Discharge: 2020-02-26 | Disposition: A | Discharge status: Home / Self Care | Payer: Medicare Other | Source: Ambulatory Visit | Attending: Urology | Admitting: Urology

## 2020-02-26 DIAGNOSIS — J9621 Acute and chronic respiratory failure with hypoxia: Secondary | ICD-10-CM | POA: Diagnosis not present

## 2020-02-26 DIAGNOSIS — I130 Hypertensive heart and chronic kidney disease with heart failure and stage 1 through stage 4 chronic kidney disease, or unspecified chronic kidney disease: Secondary | ICD-10-CM | POA: Diagnosis not present

## 2020-02-26 DIAGNOSIS — Z483 Aftercare following surgery for neoplasm: Secondary | ICD-10-CM | POA: Diagnosis not present

## 2020-02-26 DIAGNOSIS — Z01812 Encounter for preprocedural laboratory examination: Secondary | ICD-10-CM | POA: Diagnosis not present

## 2020-02-26 DIAGNOSIS — C679 Malignant neoplasm of bladder, unspecified: Secondary | ICD-10-CM | POA: Diagnosis not present

## 2020-02-26 DIAGNOSIS — J449 Chronic obstructive pulmonary disease, unspecified: Secondary | ICD-10-CM | POA: Diagnosis not present

## 2020-02-26 DIAGNOSIS — I5021 Acute systolic (congestive) heart failure: Secondary | ICD-10-CM | POA: Diagnosis not present

## 2020-02-26 DIAGNOSIS — N1831 Chronic kidney disease, stage 3a: Secondary | ICD-10-CM | POA: Diagnosis not present

## 2020-02-26 DIAGNOSIS — I5042 Chronic combined systolic (congestive) and diastolic (congestive) heart failure: Secondary | ICD-10-CM | POA: Diagnosis not present

## 2020-02-26 LAB — BASIC METABOLIC PANEL
Anion gap: 10 (ref 5–15)
BUN: 27 mg/dL — ABNORMAL HIGH (ref 8–23)
CO2: 28 mmol/L (ref 22–32)
Calcium: 9.4 mg/dL (ref 8.9–10.3)
Chloride: 106 mmol/L (ref 98–111)
Creatinine, Ser: 1.26 mg/dL — ABNORMAL HIGH (ref 0.61–1.24)
GFR, Estimated: 52 mL/min — ABNORMAL LOW (ref >60)
Glucose, Bld: 102 mg/dL — ABNORMAL HIGH (ref 70–99)
Potassium: 3.9 mmol/L (ref 3.5–5.1)
Sodium: 144 mmol/L (ref 135–145)

## 2020-02-26 LAB — CBC
HCT: 40.4 % (ref 39.0–52.0)
Hemoglobin: 12.3 g/dL — ABNORMAL LOW (ref 13.0–17.0)
MCH: 26.7 pg (ref 26.0–34.0)
MCHC: 30.4 g/dL (ref 30.0–36.0)
MCV: 87.8 fL (ref 80.0–100.0)
Platelets: 183 10*3/uL (ref 150–400)
RBC: 4.6 MIL/uL (ref 4.22–5.81)
RDW: 17 % — ABNORMAL HIGH (ref 11.5–15.5)
WBC: 3.9 10*3/uL — ABNORMAL LOW (ref 4.0–10.5)
nRBC: 0 % (ref 0.0–0.2)

## 2020-02-26 NOTE — Progress Notes (Addendum)
COVID Vaccine Completed: Yes Date COVID Vaccine completed: COVID vaccine manufacturer:     Moderna    PCP - Dr. Vonna Kotyk Dettinger Cardiologist - Dr. Minus Breeding. Clearance: Almyra Deforest: PA: 02/19/20  Chest x-ray - 02/17/20 EKG - 02/17/20 Stress Test -  ECHO - 12/21/19 Cardiac Cath -  Pacemaker/ICD device last checked:  Sleep Study -  CPAP -   Fasting Blood Sugar -  Checks Blood Sugar _____ times a day  Blood Thinner Instructions: Eliquis can be hold per 2 days before procedure as per cardiologist. Aspirin Instructions: Last Dose:  Anesthesia review: Hx: HTN,syncope,Afib,CAD, Left BBB  Patient denies shortness of breath, fever, cough and chest pain at PAT appointment   Patient verbalized understanding of instructions that were given to them at the PAT appointment. Patient was also instructed that they will need to review over the PAT instructions again at home before surgery.

## 2020-02-28 NOTE — Progress Notes (Signed)
Anesthesia Chart Review   Case: [id number] Date/Time: 03/04/20 1026   Procedures:      TRANSURETHRAL RESECTION OF BLADDER TUMOR WITH GEMCITABINE (N/A )     CYSTOSCOPY WITH BILATERAL RETROGRADE (Bilateral )   Anesthesia type: General   Pre-op diagnosis: RECURRENT BLADDER CANCER   Location[facility] ROOM 05 / [facility]   Surgeons: [name], MD      DISCUSSION:86 y.o. former smoker (35 pack years, quit 04/13/75) with h/o GERD, HTN, PVC's, A-fib (on Eliquis), h/o nonsustained Vtach not an AICD candidate, CHF (Echo 12/21/2019 with EF 35%), CAD, LBBB, COPD (O2 prn), recurrent bladder cancer scheduled for above procedure 03/04/2020 with Dr. [name].   Per cardiology note 02/19/20, "Preoperative clearance: Patient is scheduled for transurethral resection of bladder tumor with gemcitabine on 03/04/2020.  He previously underwent cystoscopy on 10/11/2019.  He is not a good candidate for any ischemic work-up.  Ejection fraction has been stable on the last several echocardiogram.  Most recent echocardiogram was performed in September 2021.  He does not have any significant shortness of breath at rest.  There has been a functional and mental decline over the year.  Given his comorbidities and his advanced age of 86 year old, he is at least a moderate to high risk patient for a relatively low to moderate risk procedure.  However, it is not completely prohibitive for the patient to proceed with the urology procedure.  He is cleared from cardiac perspective to proceed. He has been instructed to hold Eliquis for 2 days prior to the procedure and restart as soon as possible afterward."  Anticipate pt can proceed with planned procedure barring acute status change.   VS: BP 131/81   Pulse 63   Temp 36.7 C (Oral)   Resp 14   Ht 5\' 6"  (1.676 m)   Wt 59.4 kg   SpO2 (!) 88% Comment: patient is on O2 as needed per care giver his O2 usually bounces between 86 and 93 patient stated he was feeling ok at the time of his vitals  check. Nurse was informed  BMI 21.14 kg/m   PROVIDERS: Dettinger, Fransisca Kaufmann, MD is PCP   Minus Breeding, MD is Cardiologist  LABS: Labs reviewed: Acceptable for surgery. (all labs ordered are listed, but only abnormal results are displayed)  Labs Reviewed  BASIC METABOLIC PANEL - Abnormal; Notable for the following components:      Result Value   Glucose, Bld 102 (*)    BUN 27 (*)    Creatinine, Ser 1.26 (*)    GFR, Estimated 52 (*)    All other components within normal limits  CBC - Abnormal; Notable for the following components:   WBC 3.9 (*)    Hemoglobin 12.3 (*)    RDW 17.0 (*)    All other components within normal limits     IMAGES:   EKG: 02/18/20 Rate 72 bpm  Sinus or ectopic atrial rhythm  Prolonged PR interval  Consider left atrial enlargement  Consider left ventricular hypertrophy Borderline prolonged QT interval   CV: Echo 12/21/2019 IMPRESSIONS    1. Left ventricular ejection fraction, by estimation, is approximately  35%. The left ventricle has moderately decreased function. The left  ventricle demonstrates regional wall motion abnormalities (see scoring  diagram/findings for description). There is  moderate left ventricular hypertrophy. Left ventricular diastolic  parameters are indeterminate.  2. Right ventricular systolic function is moderately reduced. The right  ventricular size is normal. There is normal pulmonary artery systolic  pressure. The estimated right ventricular systolic pressure is 82.9 mmHg.  3. Left atrial size was mild to moderately dilated.  4. The mitral valve is abnormal. Mild mitral valve regurgitation.  5. The aortic valve is tricuspid. Aortic valve regurgitation is not  visualized. Mild to moderate aortic valve sclerosis/calcification is  present, without any evidence of aortic stenosis.  6. The inferior vena cava is normal in size with greater than 50%  respiratory variability, suggesting right atrial pressure  of 3 mmHg.  Past Medical History:  Diagnosis Date  . Arthritis    "all over"  . Atrial fibrillation ([place])   . Benign localized prostatic hyperplasia with lower urinary tract symptoms (LUTS)   . Bradycardia 11/28/2015   Severe-HR in the 20s to 30s following intubation for urological procedure.  Marland Kitchen CAD- non obstructive disease by cath 3/14 cardiologist-  dr hochrein  . Chronic lower back pain   . COPD (chronic obstructive pulmonary disease) ([place])   . DDD (degenerative disc disease)   . Depression   . Dyspnea    w/ exertion and lying down (raise head)  . ETOH abuse   . GERD (gastroesophageal reflux disease)   . Glaucoma   . Glaucoma, both eyes   . History of bladder cancer urologist-- dr Jeffie Pollock   first dx 2012--  s/p TURBT's  . History of DVT of lower extremity 03/2013   bilateral   . History of pulmonary embolus (PE) 04/2013  . HTN (hypertension)   . Hyperlipidemia   . Hypertension   . LBBB (left bundle branch block)    chronic  . Non-ischemic cardiomyopathy ([place])    last echo 01-18-2017, ef 35-40%  . NSVT (nonsustained ventricular tachycardia) ([place])    a. NSVT 06/2012; NSVT also seen during 03/2013 adm. b. Med rx. Not candidate for ICD given adv age.  Marland Kitchen PAD (peripheral artery disease) ([place])   . 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 PVCs (premature ventricular contractions)   . PVD (peripheral vascular disease) ([facility])    Right ABI .75, Left .78 (2006)  . Syncope 06/2012   a. Felt to be postural syncope related to diuretics 06/2012.  Marland Kitchen Systolic and diastolic CHF, chronic ([facility]CC) cardiologsit-  dr hochrein   a. NICM - patent cors 06/2012, EF 40% at that time. b. 03/2013 eval: EF 20-25%.  . Vision loss, left eye    due to glaucoma  . Wears glasses     Past Surgical History:  Procedure Laterality Date  . AMPUTATION Left 10/16/2014   Procedure: AMPUTATION ABOVE KNEE- LEFT;  Surgeon: Mal Misty, MD;  Location: [facility];  Service: Vascular;  Laterality: Left;  . CARDIAC CATHETERIZATION  05-11-2004  DR [facility]   MINIMAL CORANARY PLAQUE/ NORMAL LVF/ EF 55%/  NON-OBSTRUCTIVE LAD 25%  . CARDIAC CATHETERIZATION  06/18/2012   dr hochrein   mild luminal irregularities of coronaries, ef 45-50%  . CARDIOVASCULAR STRESS TEST  10-15-2010   LOW RISK NUCLEAR STUDY/ NO EVIDENCE OF ISCHEMIA/ NORMAL EF  . CATARACT EXTRACTION W/ INTRAOCULAR LENS  IMPLANT, BILATERAL Bilateral   . CYSTOSCOPY  10/07/2011   Procedure: CYSTOSCOPY;  Surgeon: Malka So, MD;  Location: [facility];  Service: Urology;  Laterality: N/A;  . CYSTOSCOPY N/A 10/20/2018   Procedure: CYSTOSCOPY WITH FULGURATION OF PROSTATIC URETHRAL TUMOR;  Surgeon: [name], MD;  [facility];  Service: Urology;  Laterality: N/A;  .  CYSTOSCOPY N/A 10/11/2019   Procedure: CYSTOSCOPY;  Surgeon: Cleon Gustin, MD;  [facility];  Service: Urology;  Laterality: N/A;  . CYSTOSCOPY WITH BIOPSY  03/14/2012   Procedure: CYSTOSCOPY WITH BIOPSY;  Surgeon: Malka So, MD;  [facility];  Service: Urology;  Laterality: N/A;  WITH FULGURATION  . CYSTOSCOPY WITH BIOPSY N/A 12/09/2016   Procedure: CYSTOSCOPY WITH BIOPSY AND FULGURATION;  Surgeon: [name], MD;  [facility];  Service: Urology;  Laterality: N/A;  . CYSTOSCOPY WITH BIOPSY N/A 12/20/2017   Procedure: CYSTOSCOPY WITH BIOPSY WITH FULGURATION;  Surgeon: [name], MD;  [facility];  Service: Urology;  Laterality: N/A;  . CYSTOSCOPY WITH BIOPSY N/A 06/09/2018   Procedure: CYSTOSCOPY WITH BIOPSY AND FULGURATION;  Surgeon: [name], MD;  [facility];  Service: Urology;  Laterality: N/A;  . CYSTOSCOPY WITH FULGERATION N/A 01/20/2016   Procedure: CYSTOSCOPY, BIOPSY WITH FULGERATION OF URTHRAL TUMOR;  Surgeon: [name], MD;  [facility];  Service: Urology;  Laterality: N/A;  . CYSTOSCOPY WITH FULGERATION N/A 06/01/2016   Procedure:  CYSTOSCOPY WITH FULGERATION urethral tumors and bladder neck;  Surgeon: [name], MD;  [facility];  Service: Urology;  Laterality: N/A;  . SHOULDER SURGERY Left 1970's  . TONSILLECTOMY    . TRANSTHORACIC ECHOCARDIOGRAM  01-18-2017   dr hochrein   moderate LVH, ef 35-40%, diffuse hypokinesis/  trivial AR and MR/  mild TR  . TRANSURETHRAL RESECTION OF BLADDER TUMOR  10/07/2011   Procedure: TRANSURETHRAL RESECTION OF BLADDER TUMOR (TURBT);  Surgeon: Malka So, MD;  Location: [facility];  Service: Urology;  Laterality: N/A;  . TRANSURETHRAL RESECTION OF BLADDER TUMOR N/A 07/10/2015   Procedure: TRANSURETHRAL RESECTION OF BLADDER TUMOR (TURBT), CYSTOSCOPY ;  Surgeon: [name], MD;  [facility];  Service: Urology;  Laterality: N/A;  . TRANSURETHRAL RESECTION OF BLADDER TUMOR N/A 10/11/2019   Procedure: TRANSURETHRAL RESECTION OF PROSTATIC URETHRAL TUMOR;  Surgeon: Cleon Gustin, MD;  [facility];  Service: Urology;  Laterality: N/A;  . TRANSURETHRAL RESECTION OF PROSTATE  03/14/2012   Procedure: TRANSURETHRAL RESECTION OF THE PROSTATE WITH GYRUS INSTRUMENTS;  Surgeon: Malka So, MD;  [facility];  Service: Urology;  Laterality: N/A;    MEDICATIONS: . acetaminophen (TYLENOL) 325 MG tablet  . acetaminophen (TYLENOL) 500 MG tablet  . albuterol (PROVENTIL) (2.5 MG/3ML) 0.083% nebulizer solution  . albuterol (VENTOLIN HFA) 108 (90 Base) MCG/ACT inhaler  . apixaban (ELIQUIS) 2.5 MG TABS tablet  . carvedilol (COREG) 6.25 MG tablet  . donepezil (ARICEPT) 10 MG tablet  . febuxostat (ULORIC) 40 MG tablet  . fluticasone (FLONASE) 50 MCG/ACT nasal spray  . furosemide (LASIX) 20 MG tablet  . gabapentin (NEURONTIN) 400 MG capsule  . Ipratropium-Albuterol (COMBIVENT RESPIMAT) 20-100 MCG/ACT AERS respimat  . MYRBETRIQ 25 MG TB24 tablet  . Nutritional Supplements (ENSURE NUTRITION SHAKE) LIQD  . pantoprazole (PROTONIX) 40 MG tablet  . sertraline (ZOLOFT) 50 MG  tablet  . simvastatin (ZOCOR) 10 MG tablet   No current facility-administered medications for this encounter.    Konrad Felix, PA-C [facility] [phone]

## 2020-02-28 NOTE — Anesthesia Preprocedure Evaluation (Addendum)
Anesthesia Evaluation  Patient identified by MRN, date of birth, ID band Patient awake    Reviewed: Allergy & Precautions, NPO status , Patient's Chart, lab work & pertinent test results  Airway Mallampati: II  TM Distance: >3 FB Neck ROM: Full    Dental no notable dental hx. (+) Poor Dentition, Missing,    Pulmonary shortness of breath, COPD, former smoker,    Pulmonary exam normal breath sounds clear to auscultation       Cardiovascular Exercise Tolerance: Good hypertension, + Peripheral Vascular Disease and +CHF  Normal cardiovascular exam+ dysrhythmias Atrial Fibrillation  Rhythm:Regular Rate:Normal  12/21/19 Echo 1. Left ventricular ejection fraction, by estimation, is approximately  35%. The left ventricle has moderately decreased function. The left  ventricle demonstrates regional wall motion abnormalities (see scoring  diagram/findings for description). There is  moderate left ventricular hypertrophy. Left ventricular diastolic  parameters are indeterminate.  2. Right ventricular systolic function is moderately reduced. The right  ventricular size is normal. There is normal pulmonary artery systolic  pressure. The estimated right ventricular systolic pressure is 30.9 mmHg.  3. Left atrial size was mild to moderately dilated.  4. The mitral valve is abnormal. Mild mitral valve regurgitation.    Neuro/Psych    GI/Hepatic Neg liver ROS, GERD  ,  Endo/Other  negative endocrine ROS  Renal/GU negative Renal ROSK+ 3.9 Cr 1.26   Recurrent bladder cancer    Musculoskeletal  (+) Arthritis ,   Abdominal   Peds  Hematology Hgb 12.3 Plt 183   Anesthesia Other Findings   Reproductive/Obstetrics                           Anesthesia Physical Anesthesia Plan  ASA: IV  Anesthesia Plan: General   Post-op Pain Management:    Induction: Intravenous  PONV Risk Score and Plan: 3 and  Treatment may vary due to age or medical condition, Ondansetron and TIVA  Airway Management Planned:   Additional Equipment: None  Intra-op Plan:   Post-operative Plan: Extubation in OR  Informed Consent: I have reviewed the patients History and Physical, chart, labs and discussed the procedure including the risks, benefits and alternatives for the proposed anesthesia with the patient or authorized representative who has indicated his/her understanding and acceptance.     Dental advisory given  Plan Discussed with: CRNA  Anesthesia Plan Comments: (See PAT note 02/26/2020, Konrad Felix, PA-C)     Anesthesia Quick Evaluation

## 2020-02-29 ENCOUNTER — Other Ambulatory Visit (HOSPITAL_COMMUNITY)
Admission: RE | Admit: 2020-02-29 | Discharge: 2020-02-29 | Disposition: A | Discharge status: Home / Self Care | Payer: Medicare Other | Source: Ambulatory Visit | Attending: Urology | Admitting: Urology

## 2020-02-29 ENCOUNTER — Other Ambulatory Visit (HOSPITAL_COMMUNITY): Payer: BC Managed Care – PPO

## 2020-02-29 ENCOUNTER — Other Ambulatory Visit: Payer: Self-pay

## 2020-02-29 DIAGNOSIS — Z20822 Contact with and (suspected) exposure to covid-19: Secondary | ICD-10-CM | POA: Diagnosis not present

## 2020-02-29 DIAGNOSIS — Z01812 Encounter for preprocedural laboratory examination: Secondary | ICD-10-CM | POA: Insufficient documentation

## 2020-03-01 DIAGNOSIS — Z23 Encounter for immunization: Secondary | ICD-10-CM | POA: Diagnosis not present

## 2020-03-01 LAB — SARS CORONAVIRUS 2 (TAT 6-24 HRS): SARS Coronavirus 2: NEGATIVE

## 2020-03-03 ENCOUNTER — Other Ambulatory Visit: Payer: Self-pay | Admitting: Family Medicine

## 2020-03-03 ENCOUNTER — Other Ambulatory Visit: Payer: Self-pay | Admitting: Family

## 2020-03-04 ENCOUNTER — Encounter (HOSPITAL_COMMUNITY): Payer: Self-pay | Admitting: Urology

## 2020-03-04 ENCOUNTER — Ambulatory Visit (HOSPITAL_COMMUNITY): Payer: Medicare Other

## 2020-03-04 ENCOUNTER — Inpatient Hospital Stay (HOSPITAL_COMMUNITY)
Admission: RE | Admit: 2020-03-04 | Discharge: 2020-03-09 | DRG: 668 | Disposition: A | Discharge status: Home Health | Payer: Medicare Other | Attending: Urology | Admitting: Urology

## 2020-03-04 ENCOUNTER — Encounter (HOSPITAL_COMMUNITY)
Admission: RE | Disposition: A | Discharge status: Home Health | Payer: Self-pay | Source: Home / Self Care | Attending: Urology

## 2020-03-04 ENCOUNTER — Ambulatory Visit (HOSPITAL_COMMUNITY): Payer: Medicare Other | Admitting: Anesthesiology

## 2020-03-04 ENCOUNTER — Ambulatory Visit (HOSPITAL_COMMUNITY): Payer: Medicare Other | Admitting: Physician Assistant

## 2020-03-04 ENCOUNTER — Other Ambulatory Visit: Payer: Self-pay

## 2020-03-04 DIAGNOSIS — C672 Malignant neoplasm of lateral wall of bladder: Secondary | ICD-10-CM | POA: Diagnosis not present

## 2020-03-04 DIAGNOSIS — K219 Gastro-esophageal reflux disease without esophagitis: Secondary | ICD-10-CM | POA: Diagnosis present

## 2020-03-04 DIAGNOSIS — N1831 Chronic kidney disease, stage 3a: Secondary | ICD-10-CM | POA: Diagnosis present

## 2020-03-04 DIAGNOSIS — Z7901 Long term (current) use of anticoagulants: Secondary | ICD-10-CM

## 2020-03-04 DIAGNOSIS — H5462 Unqualified visual loss, left eye, normal vision right eye: Secondary | ICD-10-CM | POA: Diagnosis present

## 2020-03-04 DIAGNOSIS — Z89612 Acquired absence of left leg above knee: Secondary | ICD-10-CM

## 2020-03-04 DIAGNOSIS — Z888 Allergy status to other drugs, medicaments and biological substances status: Secondary | ICD-10-CM

## 2020-03-04 DIAGNOSIS — C678 Malignant neoplasm of overlapping sites of bladder: Principal | ICD-10-CM | POA: Diagnosis present

## 2020-03-04 DIAGNOSIS — Z86711 Personal history of pulmonary embolism: Secondary | ICD-10-CM

## 2020-03-04 DIAGNOSIS — Z8546 Personal history of malignant neoplasm of prostate: Secondary | ICD-10-CM

## 2020-03-04 DIAGNOSIS — Z66 Do not resuscitate: Secondary | ICD-10-CM | POA: Diagnosis not present

## 2020-03-04 DIAGNOSIS — G9341 Metabolic encephalopathy: Secondary | ICD-10-CM | POA: Diagnosis present

## 2020-03-04 DIAGNOSIS — Z9841 Cataract extraction status, right eye: Secondary | ICD-10-CM

## 2020-03-04 DIAGNOSIS — F0390 Unspecified dementia without behavioral disturbance: Secondary | ICD-10-CM | POA: Diagnosis not present

## 2020-03-04 DIAGNOSIS — R0902 Hypoxemia: Secondary | ICD-10-CM

## 2020-03-04 DIAGNOSIS — E872 Acidosis: Secondary | ICD-10-CM | POA: Diagnosis not present

## 2020-03-04 DIAGNOSIS — Z87440 Personal history of urinary (tract) infections: Secondary | ICD-10-CM

## 2020-03-04 DIAGNOSIS — I130 Hypertensive heart and chronic kidney disease with heart failure and stage 1 through stage 4 chronic kidney disease, or unspecified chronic kidney disease: Secondary | ICD-10-CM | POA: Diagnosis not present

## 2020-03-04 DIAGNOSIS — Z86718 Personal history of other venous thrombosis and embolism: Secondary | ICD-10-CM

## 2020-03-04 DIAGNOSIS — M545 Low back pain, unspecified: Secondary | ICD-10-CM | POA: Diagnosis present

## 2020-03-04 DIAGNOSIS — J9601 Acute respiratory failure with hypoxia: Secondary | ICD-10-CM | POA: Diagnosis not present

## 2020-03-04 DIAGNOSIS — C671 Malignant neoplasm of dome of bladder: Secondary | ICD-10-CM | POA: Diagnosis not present

## 2020-03-04 DIAGNOSIS — I5023 Acute on chronic systolic (congestive) heart failure: Secondary | ICD-10-CM | POA: Diagnosis present

## 2020-03-04 DIAGNOSIS — J9602 Acute respiratory failure with hypercapnia: Secondary | ICD-10-CM | POA: Diagnosis not present

## 2020-03-04 DIAGNOSIS — F32A Depression, unspecified: Secondary | ICD-10-CM | POA: Diagnosis present

## 2020-03-04 DIAGNOSIS — Z881 Allergy status to other antibiotic agents status: Secondary | ICD-10-CM

## 2020-03-04 DIAGNOSIS — Z8261 Family history of arthritis: Secondary | ICD-10-CM

## 2020-03-04 DIAGNOSIS — I4891 Unspecified atrial fibrillation: Secondary | ICD-10-CM | POA: Diagnosis present

## 2020-03-04 DIAGNOSIS — I110 Hypertensive heart disease with heart failure: Secondary | ICD-10-CM | POA: Diagnosis not present

## 2020-03-04 DIAGNOSIS — I447 Left bundle-branch block, unspecified: Secondary | ICD-10-CM | POA: Diagnosis present

## 2020-03-04 DIAGNOSIS — D090 Carcinoma in situ of bladder: Secondary | ICD-10-CM | POA: Diagnosis not present

## 2020-03-04 DIAGNOSIS — M1990 Unspecified osteoarthritis, unspecified site: Secondary | ICD-10-CM | POA: Diagnosis present

## 2020-03-04 DIAGNOSIS — G8929 Other chronic pain: Secondary | ICD-10-CM | POA: Diagnosis present

## 2020-03-04 DIAGNOSIS — I2510 Atherosclerotic heart disease of native coronary artery without angina pectoris: Secondary | ICD-10-CM | POA: Diagnosis not present

## 2020-03-04 DIAGNOSIS — I5042 Chronic combined systolic (congestive) and diastolic (congestive) heart failure: Secondary | ICD-10-CM | POA: Diagnosis not present

## 2020-03-04 DIAGNOSIS — Z9079 Acquired absence of other genital organ(s): Secondary | ICD-10-CM

## 2020-03-04 DIAGNOSIS — Z79899 Other long term (current) drug therapy: Secondary | ICD-10-CM

## 2020-03-04 DIAGNOSIS — I428 Other cardiomyopathies: Secondary | ICD-10-CM | POA: Diagnosis present

## 2020-03-04 DIAGNOSIS — H409 Unspecified glaucoma: Secondary | ICD-10-CM | POA: Diagnosis present

## 2020-03-04 DIAGNOSIS — E785 Hyperlipidemia, unspecified: Secondary | ICD-10-CM | POA: Diagnosis present

## 2020-03-04 DIAGNOSIS — I472 Ventricular tachycardia: Secondary | ICD-10-CM | POA: Diagnosis not present

## 2020-03-04 DIAGNOSIS — Z8249 Family history of ischemic heart disease and other diseases of the circulatory system: Secondary | ICD-10-CM

## 2020-03-04 DIAGNOSIS — J449 Chronic obstructive pulmonary disease, unspecified: Secondary | ICD-10-CM | POA: Diagnosis present

## 2020-03-04 DIAGNOSIS — I739 Peripheral vascular disease, unspecified: Secondary | ICD-10-CM | POA: Diagnosis present

## 2020-03-04 DIAGNOSIS — Z87891 Personal history of nicotine dependence: Secondary | ICD-10-CM

## 2020-03-04 DIAGNOSIS — N400 Enlarged prostate without lower urinary tract symptoms: Secondary | ICD-10-CM | POA: Diagnosis present

## 2020-03-04 DIAGNOSIS — C679 Malignant neoplasm of bladder, unspecified: Secondary | ICD-10-CM | POA: Diagnosis present

## 2020-03-04 DIAGNOSIS — Z961 Presence of intraocular lens: Secondary | ICD-10-CM | POA: Diagnosis present

## 2020-03-04 DIAGNOSIS — I480 Paroxysmal atrial fibrillation: Secondary | ICD-10-CM | POA: Diagnosis not present

## 2020-03-04 DIAGNOSIS — Z9842 Cataract extraction status, left eye: Secondary | ICD-10-CM

## 2020-03-04 HISTORY — PX: TRANSURETHRAL RESECTION OF BLADDER TUMOR WITH MITOMYCIN-C: SHX6459

## 2020-03-04 HISTORY — PX: CYSTOSCOPY W/ RETROGRADES: SHX1426

## 2020-03-04 SURGERY — TRANSURETHRAL RESECTION OF BLADDER TUMOR WITH MITOMYCIN-C
Anesthesia: General

## 2020-03-04 MED ORDER — SIMVASTATIN 20 MG PO TABS
10.0000 mg | ORAL_TABLET | Freq: Every evening | ORAL | Status: DC
  Administered 2020-03-04 – 2020-03-08 (×5): 10 mg via ORAL
  Filled 2020-03-04 (×5): qty 1

## 2020-03-04 MED ORDER — GEMCITABINE CHEMO FOR BLADDER INSTILLATION 2000 MG
2000.0000 mg | Freq: Once | INTRAVENOUS | Status: AC
  Administered 2020-03-04: 2000 mg via INTRAVESICAL
  Filled 2020-03-04: qty 2000

## 2020-03-04 MED ORDER — SUGAMMADEX SODIUM 200 MG/2ML IV SOLN
INTRAVENOUS | Status: DC | PRN
  Administered 2020-03-04: 200 mg via INTRAVENOUS

## 2020-03-04 MED ORDER — ALBUTEROL SULFATE (2.5 MG/3ML) 0.083% IN NEBU: 2.5000 mg | INHALATION_SOLUTION | Freq: Four times a day (QID) | RESPIRATORY_TRACT | Status: DC | PRN

## 2020-03-04 MED ORDER — POTASSIUM CHLORIDE IN NACL 20-0.45 MEQ/L-% IV SOLN
INTRAVENOUS | Status: DC
  Filled 2020-03-04 (×3): qty 1000

## 2020-03-04 MED ORDER — FENTANYL CITRATE (PF) 250 MCG/5ML IJ SOLN
INTRAMUSCULAR | Status: DC | PRN
  Administered 2020-03-04: 25 ug via INTRAVENOUS

## 2020-03-04 MED ORDER — ONDANSETRON HCL 4 MG/2ML IJ SOLN: 4.0000 mg | Freq: Once | INTRAMUSCULAR | Status: DC | PRN

## 2020-03-04 MED ORDER — GABAPENTIN 400 MG PO CAPS
400.0000 mg | ORAL_CAPSULE | Freq: Two times a day (BID) | ORAL | Status: DC
  Administered 2020-03-04 – 2020-03-05 (×2): 400 mg via ORAL
  Filled 2020-03-04 (×2): qty 1

## 2020-03-04 MED ORDER — SODIUM CHLORIDE 0.9 % IR SOLN
Status: DC | PRN
  Administered 2020-03-04: 1000 mL

## 2020-03-04 MED ORDER — STERILE WATER FOR IRRIGATION IR SOLN
Status: DC | PRN
  Administered 2020-03-04: 10 mL

## 2020-03-04 MED ORDER — HYDROMORPHONE HCL 1 MG/ML IJ SOLN: 0.5000 mg | INTRAMUSCULAR | Status: DC | PRN

## 2020-03-04 MED ORDER — FLEET ENEMA 7-19 GM/118ML RE ENEM: 1.0000 | ENEMA | Freq: Once | RECTAL | Status: DC | PRN

## 2020-03-04 MED ORDER — CIPROFLOXACIN IN D5W 400 MG/200ML IV SOLN
400.0000 mg | INTRAVENOUS | Status: AC
  Administered 2020-03-04: 400 mg via INTRAVENOUS

## 2020-03-04 MED ORDER — CIPROFLOXACIN IN D5W 400 MG/200ML IV SOLN
INTRAVENOUS | Status: AC
  Filled 2020-03-04: qty 200

## 2020-03-04 MED ORDER — LACTATED RINGERS IV SOLN: INTRAVENOUS | Status: DC

## 2020-03-04 MED ORDER — MIRABEGRON ER 25 MG PO TB24
25.0000 mg | ORAL_TABLET | Freq: Every day | ORAL | Status: DC
  Administered 2020-03-05 – 2020-03-09 (×4): 25 mg via ORAL
  Filled 2020-03-04 (×5): qty 1

## 2020-03-04 MED ORDER — ONDANSETRON HCL 4 MG/2ML IJ SOLN: 4.0000 mg | INTRAMUSCULAR | Status: DC | PRN

## 2020-03-04 MED ORDER — FLUTICASONE PROPIONATE 50 MCG/ACT NA SUSP: 1.0000 | Freq: Every day | NASAL | Status: DC | PRN

## 2020-03-04 MED ORDER — ORAL CARE MOUTH RINSE: 15.0000 mL | Freq: Once | OROMUCOSAL | Status: AC

## 2020-03-04 MED ORDER — FEBUXOSTAT 40 MG PO TABS
40.0000 mg | ORAL_TABLET | Freq: Every day | ORAL | Status: DC
  Administered 2020-03-05 – 2020-03-09 (×4): 40 mg via ORAL
  Filled 2020-03-04 (×5): qty 1

## 2020-03-04 MED ORDER — PHENYLEPHRINE HCL-NACL 10-0.9 MG/250ML-% IV SOLN
INTRAVENOUS | Status: DC | PRN
  Administered 2020-03-04: 25 ug/min via INTRAVENOUS

## 2020-03-04 MED ORDER — SODIUM CHLORIDE 0.9 % IR SOLN
Status: DC | PRN
  Administered 2020-03-04: 3000 mL
  Administered 2020-03-04 (×2): 6000 mL

## 2020-03-04 MED ORDER — ALBUMIN HUMAN 5 % IV SOLN
12.5000 g | Freq: Once | INTRAVENOUS | Status: DC
Start: 2020-03-04 — End: 2020-03-04

## 2020-03-04 MED ORDER — IPRATROPIUM-ALBUTEROL 20-100 MCG/ACT IN AERS
2.0000 | INHALATION_SPRAY | Freq: Every day | RESPIRATORY_TRACT | Status: DC
  Administered 2020-03-05: 2 via RESPIRATORY_TRACT
  Filled 2020-03-04: qty 4

## 2020-03-04 MED ORDER — CARVEDILOL 6.25 MG PO TABS
6.2500 mg | ORAL_TABLET | Freq: Two times a day (BID) | ORAL | Status: DC
  Administered 2020-03-04 – 2020-03-09 (×7): 6.25 mg via ORAL
  Filled 2020-03-04 (×9): qty 1

## 2020-03-04 MED ORDER — SERTRALINE HCL 50 MG PO TABS
50.0000 mg | ORAL_TABLET | Freq: Every day | ORAL | Status: DC
  Administered 2020-03-05 – 2020-03-09 (×4): 50 mg via ORAL
  Filled 2020-03-04 (×4): qty 1

## 2020-03-04 MED ORDER — FUROSEMIDE 20 MG PO TABS
20.0000 mg | ORAL_TABLET | Freq: Every day | ORAL | Status: DC
  Administered 2020-03-05: 20 mg via ORAL
  Filled 2020-03-04: qty 1

## 2020-03-04 MED ORDER — PROPOFOL 500 MG/50ML IV EMUL
INTRAVENOUS | Status: DC | PRN
  Administered 2020-03-04: 50 ug/kg/min via INTRAVENOUS

## 2020-03-04 MED ORDER — ACETAMINOPHEN 325 MG PO TABS: 650.0000 mg | ORAL_TABLET | ORAL | Status: DC | PRN

## 2020-03-04 MED ORDER — LIDOCAINE 2% (20 MG/ML) 5 ML SYRINGE
INTRAMUSCULAR | Status: DC | PRN
  Administered 2020-03-04: 60 mg via INTRAVENOUS

## 2020-03-04 MED ORDER — FENTANYL CITRATE (PF) 100 MCG/2ML IJ SOLN: 25.0000 ug | INTRAMUSCULAR | Status: DC | PRN

## 2020-03-04 MED ORDER — PROPOFOL 10 MG/ML IV BOLUS
INTRAVENOUS | Status: DC | PRN
  Administered 2020-03-04: 60 mg via INTRAVENOUS

## 2020-03-04 MED ORDER — BISACODYL 10 MG RE SUPP: 10.0000 mg | Freq: Every day | RECTAL | Status: DC | PRN

## 2020-03-04 MED ORDER — SENNOSIDES-DOCUSATE SODIUM 8.6-50 MG PO TABS: 1.0000 | ORAL_TABLET | Freq: Every evening | ORAL | Status: DC | PRN

## 2020-03-04 MED ORDER — ALBUMIN HUMAN 5 % IV SOLN: 12.5000 g | Freq: Once | INTRAVENOUS | Status: AC

## 2020-03-04 MED ORDER — CHLORHEXIDINE GLUCONATE 0.12 % MT SOLN
15.0000 mL | Freq: Once | OROMUCOSAL | Status: AC
  Administered 2020-03-04: 15 mL via OROMUCOSAL

## 2020-03-04 MED ORDER — DONEPEZIL HCL 10 MG PO TABS
10.0000 mg | ORAL_TABLET | Freq: Every day | ORAL | Status: DC
  Administered 2020-03-04 – 2020-03-08 (×3): 10 mg via ORAL
  Filled 2020-03-04 (×5): qty 1

## 2020-03-04 MED ORDER — HYDROCODONE-ACETAMINOPHEN 5-325 MG PO TABS: 1.0000 | ORAL_TABLET | ORAL | Status: DC | PRN

## 2020-03-04 MED ORDER — ALBUTEROL SULFATE HFA 108 (90 BASE) MCG/ACT IN AERS: 2.0000 | INHALATION_SPRAY | Freq: Four times a day (QID) | RESPIRATORY_TRACT | Status: DC | PRN

## 2020-03-04 MED ORDER — PANTOPRAZOLE SODIUM 40 MG PO TBEC
40.0000 mg | DELAYED_RELEASE_TABLET | Freq: Every day | ORAL | Status: DC
  Administered 2020-03-05 – 2020-03-07 (×2): 40 mg via ORAL
  Filled 2020-03-04 (×2): qty 1

## 2020-03-04 MED ORDER — ALBUMIN HUMAN 5 % IV SOLN
INTRAVENOUS | Status: AC
  Administered 2020-03-04: 12.5 g via INTRAVENOUS
  Filled 2020-03-04: qty 250

## 2020-03-04 MED ORDER — FENTANYL CITRATE (PF) 250 MCG/5ML IJ SOLN
INTRAMUSCULAR | Status: AC
  Filled 2020-03-04: qty 5

## 2020-03-04 MED ORDER — ALBUMIN HUMAN 5 % IV SOLN: INTRAVENOUS | Status: DC | PRN

## 2020-03-04 MED ORDER — OXYBUTYNIN CHLORIDE 5 MG PO TABS: 5.0000 mg | ORAL_TABLET | Freq: Three times a day (TID) | ORAL | Status: DC | PRN

## 2020-03-04 MED ORDER — PROPOFOL 10 MG/ML IV BOLUS
INTRAVENOUS | Status: AC
  Filled 2020-03-04: qty 20

## 2020-03-04 MED ORDER — IOHEXOL 300 MG/ML  SOLN
INTRAMUSCULAR | Status: DC | PRN
  Administered 2020-03-04: 17 mL

## 2020-03-04 MED ORDER — ACETAMINOPHEN 10 MG/ML IV SOLN: 1000.0000 mg | Freq: Once | INTRAVENOUS | Status: DC | PRN

## 2020-03-04 MED ORDER — PHENYLEPHRINE 40 MCG/ML (10ML) SYRINGE FOR IV PUSH (FOR BLOOD PRESSURE SUPPORT)
PREFILLED_SYRINGE | INTRAVENOUS | Status: DC | PRN
  Administered 2020-03-04: 200 ug via INTRAVENOUS
  Administered 2020-03-04: 80 ug via INTRAVENOUS
  Administered 2020-03-04: 120 ug via INTRAVENOUS

## 2020-03-04 MED ORDER — ROCURONIUM BROMIDE 10 MG/ML (PF) SYRINGE
PREFILLED_SYRINGE | INTRAVENOUS | Status: DC | PRN
  Administered 2020-03-04: 30 mg via INTRAVENOUS

## 2020-03-04 SURGICAL SUPPLY — 22 items
BAG DRN RND TRDRP ANRFLXCHMBR (UROLOGICAL SUPPLIES) ×1
BAG URINE DRAIN 2000ML AR STRL (UROLOGICAL SUPPLIES) ×3 IMPLANT
BAG URO CATCHER STRL LF (MISCELLANEOUS) ×3 IMPLANT
CATH FOLEY 2WAY SLVR  5CC 20FR (CATHETERS) ×3
CATH FOLEY 2WAY SLVR 5CC 20FR (CATHETERS) ×2 IMPLANT
CATH FOLEY 3WAY 30CC 22FR (CATHETERS) IMPLANT
CATH URET 5FR 28IN OPEN ENDED (CATHETERS) IMPLANT
CLOTH BEACON ORANGE TIMEOUT ST (SAFETY) ×3 IMPLANT
GLOVE SURG SS PI 8.0 STRL IVOR (GLOVE) IMPLANT
GOWN STRL REUS W/TWL XL LVL3 (GOWN DISPOSABLE) ×3 IMPLANT
GUIDEWIRE STR DUAL SENSOR (WIRE) ×3 IMPLANT
HOLDER FOLEY CATH W/STRAP (MISCELLANEOUS) IMPLANT
KIT TURNOVER KIT A (KITS) IMPLANT
LOOP CUT BIPOLAR 24F LRG (ELECTROSURGICAL) IMPLANT
MANIFOLD NEPTUNE II (INSTRUMENTS) ×3 IMPLANT
PACK CYSTO (CUSTOM PROCEDURE TRAY) ×3 IMPLANT
SUT ETHILON 3 0 PS 1 (SUTURE) IMPLANT
SYR 30ML LL (SYRINGE) IMPLANT
SYR TOOMEY IRRIG 70ML (MISCELLANEOUS)
SYRINGE TOOMEY IRRIG 70ML (MISCELLANEOUS) IMPLANT
TUBING CONNECTING 10 (TUBING) ×3 IMPLANT
TUBING UROLOGY SET (TUBING) ×3 IMPLANT

## 2020-03-04 NOTE — Transfer of Care (Signed)
Immediate Anesthesia Transfer of Care Note  Patient: Scott [name]  Procedure(s) Performed: TRANSURETHRAL RESECTION OF BLADDER TUMOR WITH GEMCITABINE (N/A ) CYSTOSCOPY WITH BILATERAL RETROGRADE (Bilateral )  Patient Location: PACU  Anesthesia Type:General  Level of Consciousness: sedated  Airway & Oxygen Therapy: Patient placed on Ventilator (see vital sign flow sheet for setting) and see RT PS and Peep vent requested- DR Valma Cava remains with pt  Post-op Assessment: Report given to RN, Post -op Vital signs reviewed and stable and remains intubted as ETCO2 remains 60's and sleepy-- TV only 150-200  Post vital signs: Reviewed and stable  Last Vitals:  Vitals Value Taken Time  BP 120/71 03/04/20 1215  Temp    Pulse 80 03/04/20 1221  Resp 14 03/04/20 1222  SpO2 100 % 03/04/20 1221  Vitals shown include unvalidated device data.  Last Pain:  Vitals:   03/04/20 0910  TempSrc:   PainSc: 0-No pain         Complications: No complications documented.

## 2020-03-04 NOTE — Op Note (Signed)
Procedure: 1.  Cystoscopy with bilateral retrograde pyelography and interpretation. 2.  Transurethral resection of large bladder tumor of overlapping sites. 3.  Instillation of gemcitabine in PACU.  Preop diagnosis: Recurrent urothelial carcinoma.  Postop diagnosis: Same.  Surgeon: Dr. [name].  Anesthesia: General.  Specimen: Bladder tumor fronds from the prostatic urethra, posterior bladder neck, anterior bladder neck and dome of the bladder.  Drains: 20 French Foley catheter.  EBL: None.  Complications: None.  Indications: The patient is a 86 year old male with a history of recurrent urothelial carcinoma that primarily been in the prostatic urethra.  He returns for delayed surveillance cystoscopy and was found to have multifocal recurrences of the prostatic urethra bladder neck and dome of the bladder.  It was felt that resection of these tumors with subsequent instillation of gemcitabine was indicated along with bilateral retrograde pyelography.  Procedure: He was taken operating room where he was given 4 mg of Cipro IV.  A general anesthetic was induced.  He was placed in lithotomy position.  He was prepped with Betadine solution and draped in usual sterile fashion.  Cystoscopy was performed using the 23 [place] scope and 30 degree lens.  Examination revealed a normal urethra but in the posterior bulb there was a small 3 to 4 mm tumor and at the external sphincter on the left there was a small 3 5 mm tumor.  The external sphincter was intact.  The prostatic urethra was approximately 3 cm in length with evidence of prior resection but on the right lateral wall toward the floor of the prostatic urethra there is approximately a 2 cm recurrent papillary tumor.  Further inspection revealed additional tumors of the right anterior bladder neck and the mid posterior bladder neck.  Additionally tumors were identified on the dome that were multifocal with some additional tumors on the right and  left lateral wall.  The tumors were all papillary and superficial in appearance but a couple of them had evidence of recent or active bleeding.  The bladder wall had moderate trabeculation with some cellules.  Ureteral orifices were unremarkable.  The right ureteral orifice was cannulated with a 5 [place] open-ended catheter and Omnipaque was instilled.  The right retrograde pyelogram revealed a normal ureter and intrarenal collecting system.  The left ureteral orifice was cannulated with a 5 [place] open-ended catheter and contrast was instilled.  The left retrograde pyelogram demonstrated a normal ureter and intrarenal collecting system.  The cystoscope was then replaced with the 26 French continuous-flow resectoscope sheath which was fitted with the aid of the obturator.  The obturator was replaced with the [facility] handle with a bipolar loop and 30 degree lens.  Saline being used as the irrigant.  The tumor that had noted in the posterior bulbar urethra was no longer apparent and the site could not be identified it was felt that it had been sheared off by scope passage.  The tumor at the external sphincter on the left was gently fulgurated to avoid damage to the sphincter.  The tumor in the prostatic urethra was resected completely and then the resection site was generously fulgurated including surrounding mucosa.  The specimen was collected.  The posterior bladder neck tumor was then resected it was approximately 3 cm in size and that specimen was removed and the resection site was fulgurated.  I then noted the anterior bladder neck tumor that extended down onto the right lateral bladder neck and this was resected.  It was also approximately 3 cm in  size in the resection sites between the prostatic urethra the posterior bladder neck and right anterior bladder neck were all contiguous after fulguration but the specimens from those 3 sites were sent separately..  I then resected multiple tumors on the  dome of which approximately 4 were 1 to 2 cm in diameter and there were several smaller tumors on the dome in the left and right anterior bladder wall there were either resected or fulgurated.  The specimens were sent together.  Further  inspection demonstrated a couple of other smaller tumors that were more posterior and not seen on the initial evaluation and these were fulgurated.  Final inspection revealed no residual tumor fronds or mucosal areas of concern.  No residual tissue was noted.  All of the resection and fulguration sites had good hemostasis.  The scope was removed and a 20 [place] Foley catheter was inserted.  The balloon was filled with 10 mL of sterile fluid.  The cath was placed to straight drainage.  He was taken down from lithotomy position, his anesthetic was reversed and he was moved recovery in stable condition.  In the recovery room his bladder was instilled with 2000 mg of gemcitabine and 50 mL of diluent.  This was left indwelling for 1 hour before the bladder was drained.  There were no complications.

## 2020-03-04 NOTE — Plan of Care (Signed)
  Problem: Education: Goal: Knowledge of General Education information will improve Description: Including pain rating scale, medication(s)/side effects and non-pharmacologic comfort measures Outcome: Progressing   Problem: Health Behavior/Discharge Planning: Goal: Ability to manage health-related needs will improve Outcome: Progressing   Problem: Clinical Measurements: Goal: Ability to maintain clinical measurements within normal limits will improve Outcome: Progressing Goal: Will remain free from infection Outcome: Progressing Goal: Respiratory complications will improve Outcome: Progressing   Problem: Activity: Goal: Risk for activity intolerance will decrease Outcome: Progressing   Problem: Nutrition: Goal: Adequate nutrition will be maintained Outcome: Progressing   Problem: Clinical Measurements: Goal: Diagnostic test results will improve Outcome: Completed/Met Goal: Cardiovascular complication will be avoided Outcome: Completed/Met   Problem: Coping: Goal: Level of anxiety will decrease Outcome: Completed/Met

## 2020-03-04 NOTE — Anesthesia Postprocedure Evaluation (Signed)
Anesthesia Post Note  Patient: Scott [name]  Procedure(s) Performed: TRANSURETHRAL RESECTION OF BLADDER TUMOR WITH GEMCITABINE (N/A ) CYSTOSCOPY WITH BILATERAL RETROGRADE (Bilateral )     Patient location during evaluation: PACU Anesthesia Type: General Level of consciousness: awake and alert Pain management: pain level controlled Vital Signs Assessment: post-procedure vital signs reviewed and stable Respiratory status: spontaneous breathing, nonlabored ventilation, respiratory function stable and patient connected to nasal cannula oxygen Cardiovascular status: blood pressure returned to baseline and stable Postop Assessment: no apparent nausea or vomiting Anesthetic complications: no   No complications documented.  Last Vitals:  Vitals:   03/04/20 1345 03/04/20 1400  BP: (!) 144/63 (!) 141/69  Pulse: (!) 59 65  Resp: 16 17  Temp: (!) 35.8 C   SpO2: 100% 100%    Last Pain:  Vitals:   03/04/20 1400  TempSrc:   PainSc: [place]

## 2020-03-04 NOTE — Anesthesia Procedure Notes (Signed)
Procedure Name: Intubation Date/Time: 03/04/2020 10:37 AM Performed by: Cynda Familia, CRNA Pre-anesthesia Checklist: Patient identified, Emergency Drugs available, Suction available and Patient being monitored Patient Re-evaluated:Patient Re-evaluated prior to induction Oxygen Delivery Method: Circle System Utilized Preoxygenation: Pre-oxygenation with 100% oxygen Induction Type: IV induction and Cricoid Pressure applied Ventilation: Mask ventilation without difficulty Laryngoscope Size: 2 Grade View: Grade I Tube type: Oral Tube size: 7.5 mm Number of attempts: 1 Airway Equipment and Method: Stylet Placement Confirmation: ETT inserted through vocal cords under direct vision,  positive ETCO2 and breath sounds checked- equal and bilateral Secured at: 23 cm Tube secured with: Tape Dental Injury: Teeth and Oropharynx as per pre-operative assessment  Comments: Smooth IV induction Houser-- intubation AM CRNA atraumatic-- teeth and mouth as preop-- bilat BS [place]-- very poor dentition- many missing teeth, chipped and broken-- unchanged with laryngscopy

## 2020-03-04 NOTE — Discharge Instructions (Signed)
Transurethral Resection of Bladder Tumor, Care After This sheet gives you information about how to care for yourself after your procedure. Your health care provider may also give you more specific instructions. If you have problems or questions, contact your health care provider. What can I expect after the procedure? After the procedure, it is common to have:  A small amount of blood in your urine for up to 2 weeks.  Soreness or mild pain from your catheter. After your catheter is removed, you may have mild soreness, especially when urinating.  Pain in your lower abdomen. Follow these instructions at home: Medicines   Take over-the-counter and prescription medicines only as told by your health care provider.  If you were prescribed an antibiotic medicine, take it as told by your health care provider. Do not stop taking the antibiotic even if you start to feel better.  Do not drive for 24 hours if you were given a sedative during your procedure.  Ask your health care provider if the medicine prescribed to you: ? Requires you to avoid driving or using heavy machinery. ? Can cause constipation. You may need to take these actions to prevent or treat constipation:  Take over-the-counter or prescription medicines.  Eat foods that are high in fiber, such as beans, whole grains, and fresh fruits and vegetables.  Limit foods that are high in fat and processed sugars, such as fried or sweet foods. Activity  Return to your normal activities as told by your health care provider. Ask your health care provider what activities are safe for you.  Do not lift anything that is heavier than 10 lb (4.5 kg), or the limit that you are told, until your health care provider says that it is safe.  Avoid intense physical activity for as long as told by your health care provider.  Rest as told by your health care provider.  Avoid sitting for a long time without moving. Get up to take short walks every  1-2 hours. This is important to improve blood flow and breathing. Ask for help if you feel weak or unsteady. General instructions   Do not drink alcohol for as long as told by your health care provider. This is especially important if you are taking prescription pain medicines.  Do not take baths, swim, or use a hot tub until your health care provider approves. Ask your health care provider if you may take showers. You may only be allowed to take sponge baths.  If you have a catheter, follow instructions from your health care provider about caring for your catheter and your drainage bag.  Drink enough fluid to keep your urine pale yellow.  Wear compression stockings as told by your health care provider. These stockings help to prevent blood clots and reduce swelling in your legs.  Keep all follow-up visits as told by your health care provider. This is important. ? You will need to be followed closely with regular checks of your bladder and urethra (cystoscopies) to make sure that the cancer does not come back. Contact a health care provider if:  You have pain that gets worse or does not improve with medicine.  You have blood in your urine for more than 2 weeks.  You have cloudy or bad-smelling urine.  You become constipated. Signs of constipation may include having: ? Fewer than three bowel movements in a week. ? Difficulty having a bowel movement. ? Stools that are dry, hard, or larger than normal.  You have  a fever. Get help right away if:  You have: ? Severe pain. ? Bright red blood in your urine. ? Blood clots in your urine. ? A lot of blood in your urine.  Your catheter has been removed and you are not able to urinate.  You have a catheter in place and the catheter is not draining urine. Summary  After your procedure, it is common to have a small amount of blood in your urine, soreness or mild pain from your catheter, and pain in your lower abdomen.  Take  over-the-counter and prescription medicines only as told by your health care provider.  Rest as told by your health care provider. Follow your health care provider's instructions about returning to normal activities. Ask what activities are safe for you.  If you have a catheter, follow instructions from your health care provider about caring for your catheter and your drainage bag. Get help right away if you cannot urinate, you have severe pain, or you have bright red blood or blood clots in your urine.  You may resume the eliquis  in 1 week if the urine is clear.   This information is not intended to replace advice given to you by your health care provider. Make sure you discuss any questions you have with your health care provider. Document Revised: 10/27/2017 Document Reviewed: 10/27/2017 Elsevier Patient Education  [place].

## 2020-03-04 NOTE — Interval H&P Note (Signed)
History and Physical Interval Note:  03/04/2020 10:15 AM  Scott [name]  has presented today for surgery, with the diagnosis of RECURRENT BLADDER CANCER.  The various methods of treatment have been discussed with the patient and family. After consideration of risks, benefits and other options for treatment, the patient has consented to  Procedure(s): TRANSURETHRAL RESECTION OF BLADDER TUMOR WITH GEMCITABINE (N/A) CYSTOSCOPY WITH BILATERAL RETROGRADE (Bilateral) as a surgical intervention.  The patient's history has been reviewed, patient examined, no change in status, stable for surgery.  I have reviewed the patient's chart and labs.  Questions were answered to the patient's satisfaction.     [name]

## 2020-03-05 ENCOUNTER — Encounter (HOSPITAL_COMMUNITY): Payer: Self-pay | Admitting: Urology

## 2020-03-05 ENCOUNTER — Observation Stay (HOSPITAL_COMMUNITY): Payer: Medicare Other

## 2020-03-05 ENCOUNTER — Other Ambulatory Visit: Payer: Self-pay | Admitting: Family Medicine

## 2020-03-05 ENCOUNTER — Ambulatory Visit: Payer: BC Managed Care – PPO | Admitting: General Practice

## 2020-03-05 DIAGNOSIS — N400 Enlarged prostate without lower urinary tract symptoms: Secondary | ICD-10-CM | POA: Diagnosis present

## 2020-03-05 DIAGNOSIS — M1990 Unspecified osteoarthritis, unspecified site: Secondary | ICD-10-CM | POA: Diagnosis present

## 2020-03-05 DIAGNOSIS — R0902 Hypoxemia: Secondary | ICD-10-CM | POA: Diagnosis not present

## 2020-03-05 DIAGNOSIS — G9341 Metabolic encephalopathy: Secondary | ICD-10-CM | POA: Diagnosis not present

## 2020-03-05 DIAGNOSIS — C679 Malignant neoplasm of bladder, unspecified: Secondary | ICD-10-CM | POA: Diagnosis not present

## 2020-03-05 DIAGNOSIS — I4891 Unspecified atrial fibrillation: Secondary | ICD-10-CM | POA: Diagnosis present

## 2020-03-05 DIAGNOSIS — Z86711 Personal history of pulmonary embolism: Secondary | ICD-10-CM | POA: Diagnosis not present

## 2020-03-05 DIAGNOSIS — I5023 Acute on chronic systolic (congestive) heart failure: Secondary | ICD-10-CM | POA: Diagnosis not present

## 2020-03-05 DIAGNOSIS — I130 Hypertensive heart and chronic kidney disease with heart failure and stage 1 through stage 4 chronic kidney disease, or unspecified chronic kidney disease: Secondary | ICD-10-CM | POA: Diagnosis not present

## 2020-03-05 DIAGNOSIS — I480 Paroxysmal atrial fibrillation: Secondary | ICD-10-CM | POA: Diagnosis not present

## 2020-03-05 DIAGNOSIS — E872 Acidosis: Secondary | ICD-10-CM | POA: Diagnosis present

## 2020-03-05 DIAGNOSIS — Z66 Do not resuscitate: Secondary | ICD-10-CM | POA: Diagnosis not present

## 2020-03-05 DIAGNOSIS — I10 Essential (primary) hypertension: Secondary | ICD-10-CM

## 2020-03-05 DIAGNOSIS — J9601 Acute respiratory failure with hypoxia: Secondary | ICD-10-CM | POA: Diagnosis not present

## 2020-03-05 DIAGNOSIS — I5042 Chronic combined systolic (congestive) and diastolic (congestive) heart failure: Secondary | ICD-10-CM

## 2020-03-05 DIAGNOSIS — Z86718 Personal history of other venous thrombosis and embolism: Secondary | ICD-10-CM | POA: Diagnosis not present

## 2020-03-05 DIAGNOSIS — N1831 Chronic kidney disease, stage 3a: Secondary | ICD-10-CM | POA: Diagnosis present

## 2020-03-05 DIAGNOSIS — Z7901 Long term (current) use of anticoagulants: Secondary | ICD-10-CM | POA: Diagnosis not present

## 2020-03-05 DIAGNOSIS — M545 Low back pain, unspecified: Secondary | ICD-10-CM | POA: Diagnosis present

## 2020-03-05 DIAGNOSIS — I739 Peripheral vascular disease, unspecified: Secondary | ICD-10-CM | POA: Diagnosis present

## 2020-03-05 DIAGNOSIS — R21 Rash and other nonspecific skin eruption: Secondary | ICD-10-CM | POA: Diagnosis not present

## 2020-03-05 DIAGNOSIS — Z89612 Acquired absence of left leg above knee: Secondary | ICD-10-CM | POA: Diagnosis not present

## 2020-03-05 DIAGNOSIS — J449 Chronic obstructive pulmonary disease, unspecified: Secondary | ICD-10-CM | POA: Diagnosis not present

## 2020-03-05 DIAGNOSIS — G8929 Other chronic pain: Secondary | ICD-10-CM | POA: Diagnosis present

## 2020-03-05 DIAGNOSIS — C678 Malignant neoplasm of overlapping sites of bladder: Secondary | ICD-10-CM | POA: Diagnosis not present

## 2020-03-05 DIAGNOSIS — I2510 Atherosclerotic heart disease of native coronary artery without angina pectoris: Secondary | ICD-10-CM | POA: Diagnosis present

## 2020-03-05 DIAGNOSIS — F0390 Unspecified dementia without behavioral disturbance: Secondary | ICD-10-CM | POA: Diagnosis not present

## 2020-03-05 DIAGNOSIS — J90 Pleural effusion, not elsewhere classified: Secondary | ICD-10-CM | POA: Diagnosis not present

## 2020-03-05 DIAGNOSIS — J9811 Atelectasis: Secondary | ICD-10-CM | POA: Diagnosis not present

## 2020-03-05 DIAGNOSIS — I472 Ventricular tachycardia: Secondary | ICD-10-CM | POA: Diagnosis not present

## 2020-03-05 DIAGNOSIS — J9602 Acute respiratory failure with hypercapnia: Secondary | ICD-10-CM | POA: Diagnosis present

## 2020-03-05 DIAGNOSIS — I428 Other cardiomyopathies: Secondary | ICD-10-CM | POA: Diagnosis not present

## 2020-03-05 LAB — COMPREHENSIVE METABOLIC PANEL
ALT: 13 U/L (ref 0–44)
AST: 18 U/L (ref 15–41)
Albumin: 3.8 g/dL (ref 3.5–5.0)
Alkaline Phosphatase: 139 U/L — ABNORMAL HIGH (ref 38–126)
Anion gap: 11 (ref 5–15)
BUN: 34 mg/dL — ABNORMAL HIGH (ref 8–23)
CO2: 24 mmol/L (ref 22–32)
Calcium: 8.8 mg/dL — ABNORMAL LOW (ref 8.9–10.3)
Chloride: 104 mmol/L (ref 98–111)
Creatinine, Ser: 1.55 mg/dL — ABNORMAL HIGH (ref 0.61–1.24)
GFR, Estimated: 41 mL/min — ABNORMAL LOW (ref >60)
Glucose, Bld: 84 mg/dL (ref 70–99)
Potassium: 5 mmol/L (ref 3.5–5.1)
Sodium: 139 mmol/L (ref 135–145)
Total Bilirubin: 0.9 mg/dL (ref 0.3–1.2)
Total Protein: 6.6 g/dL (ref 6.5–8.1)

## 2020-03-05 LAB — BLOOD GAS, ARTERIAL
Acid-base deficit: 0.4 mmol/L (ref 0.0–2.0)
Bicarbonate: 27.9 mmol/L (ref 20.0–28.0)
Drawn by: 331471
O2 Saturation: 84 %
Patient temperature: 98.6
pCO2 arterial: 68.3 mmHg (ref 32.0–48.0)
pH, Arterial: 7.235 — ABNORMAL LOW (ref 7.350–7.450)
pO2, Arterial: 55.2 mmHg — ABNORMAL LOW (ref 83.0–108.0)

## 2020-03-05 LAB — AMMONIA: Ammonia: 23 umol/L (ref 9–35)

## 2020-03-05 LAB — CBC
HCT: 40.7 % (ref 39.0–52.0)
Hemoglobin: 12 g/dL — ABNORMAL LOW (ref 13.0–17.0)
MCH: 27.1 pg (ref 26.0–34.0)
MCHC: 29.5 g/dL — ABNORMAL LOW (ref 30.0–36.0)
MCV: 92.1 fL (ref 80.0–100.0)
Platelets: 137 10*3/uL — ABNORMAL LOW (ref 150–400)
RBC: 4.42 MIL/uL (ref 4.22–5.81)
RDW: 17.1 % — ABNORMAL HIGH (ref 11.5–15.5)
WBC: 5.3 10*3/uL (ref 4.0–10.5)
nRBC: 0 % (ref 0.0–0.2)

## 2020-03-05 LAB — MRSA PCR SCREENING: MRSA by PCR: NEGATIVE

## 2020-03-05 LAB — SURGICAL PATHOLOGY

## 2020-03-05 MED ORDER — CHLORHEXIDINE GLUCONATE 0.12 % MT SOLN
15.0000 mL | Freq: Two times a day (BID) | OROMUCOSAL | Status: DC
  Administered 2020-03-06 – 2020-03-09 (×6): 15 mL via OROMUCOSAL
  Filled 2020-03-05 (×7): qty 15

## 2020-03-05 MED ORDER — ORAL CARE MOUTH RINSE
15.0000 mL | Freq: Two times a day (BID) | OROMUCOSAL | Status: DC
  Administered 2020-03-07 – 2020-03-09 (×4): 15 mL via OROMUCOSAL

## 2020-03-05 MED ORDER — CHLORHEXIDINE GLUCONATE CLOTH 2 % EX PADS
6.0000 | MEDICATED_PAD | Freq: Every day | CUTANEOUS | Status: DC
  Administered 2020-03-05 – 2020-03-08 (×3): 6 via TOPICAL

## 2020-03-05 MED ORDER — FUROSEMIDE 10 MG/ML IJ SOLN
20.0000 mg | Freq: Once | INTRAMUSCULAR | Status: AC
  Administered 2020-03-05: 20 mg via INTRAVENOUS
  Filled 2020-03-05: qty 2

## 2020-03-05 MED ORDER — ORAL CARE MOUTH RINSE: 15.0000 mL | Freq: Two times a day (BID) | OROMUCOSAL | Status: DC

## 2020-03-05 NOTE — Progress Notes (Addendum)
Pt voided 88ml with prompting. PVR with bladder scanner showed 71ml. Urine bloody with no clots. Pt still very drowsy and unable to stay awake to eat breakfast. Will continue to monitor.   1350: Pt O2 on RA 77%. 2L O2 [place] reapplied with O2 92%. Pt still very drowsy, responds to voice but unable to stay awake. MD paged for update.

## 2020-03-05 NOTE — Plan of Care (Signed)
  Problem: Education: Goal: Knowledge of General Education information will improve Description: Including pain rating scale, medication(s)/side effects and non-pharmacologic comfort measures Outcome: Progressing   Problem: Clinical Measurements: Goal: Ability to maintain clinical measurements within normal limits will improve Outcome: Progressing Goal: Will remain free from infection Outcome: Progressing Goal: Respiratory complications will improve Outcome: Progressing   Problem: Activity: Goal: Risk for activity intolerance will decrease Outcome: Progressing   

## 2020-03-05 NOTE — Progress Notes (Signed)
1 Day Post-Op  Subjective: Scott [name] is still sleepy but arousable and oriented.  His urine is light pink.  He has no complaints.  ROS:  Review of Systems  All other systems reviewed and are negative.   Anti-infectives: Anti-infectives (From admission, onward)   Start     Dose/Rate Route Frequency Ordered Stop   03/04/20 0856  ciprofloxacin (CIPRO) 400 MG/200ML IVPB       Note to Pharmacy: Scott [name]   : cabinet override      03/04/20 0856 03/04/20 1059   03/04/20 0853  ciprofloxacin (CIPRO) IVPB 400 mg        400 mg 200 mL/hr over 60 Minutes Intravenous 60 min pre-op 03/04/20 0853 03/04/20 1038      Current Facility-Administered Medications  Medication Dose Route Frequency Provider Last Rate Last Admin  . 0.45 % NaCl with KCl 20 mEq / L infusion   Intravenous Continuous [name], MD 75 mL/hr at 03/05/20 0511 New Bag at 03/05/20 0511  . acetaminophen (TYLENOL) tablet 650 mg  650 mg Oral Q4H PRN [name], MD      . albuterol (PROVENTIL) (2.5 MG/3ML) 0.083% nebulizer solution 2.5 mg  2.5 mg Nebulization Q6H PRN [name], MD      . bisacodyl (DULCOLAX) suppository 10 mg  10 mg Rectal Daily PRN [name], MD      . carvedilol (COREG) tablet 6.25 mg  6.25 mg Oral BID [name], MD   6.25 mg at 03/04/20 2111  . Chlorhexidine Gluconate Cloth 2 % PADS 6 each  6 each Topical Daily [name], MD      . donepezil (ARICEPT) tablet 10 mg  10 mg Oral QHS [name], MD   10 mg at 03/04/20 2111  . febuxostat (ULORIC) tablet 40 mg  40 mg Oral Daily [name], MD      . fluticasone [facility]) 50 MCG/ACT nasal spray 1 spray  1 spray Each Nare Daily PRN [name], MD      . furosemide (LASIX) tablet 20 mg  20 mg Oral Daily [name], MD      . gabapentin (NEURONTIN) capsule 400 mg  400 mg Oral BID [name], MD   400 mg at 03/04/20 2115  . HYDROcodone-acetaminophen (NORCO/VICODIN) 5-325 MG per tablet 1-2 tablet  1-2 tablet Oral Q4H PRN [name], MD      .  HYDROmorphone (DILAUDID) injection 0.5-1 mg  0.5-1 mg Intravenous Q2H PRN [name], MD      . Ipratropium-Albuterol (COMBIVENT) respimat 2 puff  2 puff Inhalation Daily [name], MD   2 puff at 03/05/20 0800  . mirabegron ER (MYRBETRIQ) tablet 25 mg  25 mg Oral Daily [name], MD      . ondansetron [facility]) injection 4 mg  4 mg Intravenous Q4H PRN [name], MD      . oxybutynin (DITROPAN) tablet 5 mg  5 mg Oral Q8H PRN [name], MD      . pantoprazole (PROTONIX) EC tablet 40 mg  40 mg Oral Daily [name], MD      . senna-docusate (Senokot-S) tablet 1 tablet  1 tablet Oral QHS PRN [name], MD      . sertraline (ZOLOFT) tablet 50 mg  50 mg Oral Daily [name], MD      . simvastatin (ZOCOR) tablet 10 mg  10 mg Oral QPM [name], MD   10 mg at 03/04/20 1710  . sodium phosphate (FLEET) 7-19 GM/118ML  enema 1 enema  1 enema Rectal Once PRN [name], MD         Objective: Vital signs in last 24 hours: Temp:  [96.4 F (35.8 C)-98.5 F (36.9 C)] 98.5 F (36.9 C) (11/24 0342) Pulse Rate:  [43-89] 86 (11/24 0342) Resp:  [9-27] 16 (11/24 0342) BP: (72-153)/(42-82) 133/59 (11/24 0342) SpO2:  [90 %-100 %] 98 % (11/24 0803) FiO2 (%):  [25 %-30 %] 30 % (11/23 1345) Weight:  [59.4 kg] 59.4 kg (11/23 0854)  Intake/Output from previous day: 11/23 0701 - 11/24 0700 In: 2020.6 [P.O.:120; I.V.:1450.6; IV Piggyback:450] Out: 1175 [Urine:1175] Intake/Output this shift: No intake/output data recorded.   Physical Exam Vitals reviewed.  Constitutional:      Appearance: Normal appearance.  Cardiovascular:     Rate and Rhythm: Normal rate and regular rhythm.  Pulmonary:     Effort: Pulmonary effort is normal. No respiratory distress.     Breath sounds: Normal breath sounds.  Neurological:     Mental Status: He is alert.     Lab Results:  No results for input(s): WBC, HGB, HCT, PLT in the last 72 hours. BMET No results for input(s): NA, K, CL, CO2, GLUCOSE, BUN,  CREATININE, CALCIUM in the last 72 hours. PT/INR No results for input(s): LABPROT, INR in the last 72 hours. ABG No results for input(s): PHART, HCO3 in the last 72 hours.  Invalid input(s): PCO2, PO2  Studies/Results: DG C-Arm 1-60 Min-No Report  Result Date: 03/04/2020 Fluoroscopy was utilized by the requesting physician.  No radiographic interpretation.     Assessment and Plan: Multifocal bladder cancer s/p TURBT.  He is still sleepy from the anesthesia but his urine is clearing so I will d/c the foley and he can go home later today if voiding and alert.       LOS: 0 days    [name] 03/05/[phone] ID: Scott [name], male   DOB: [birth year], 86 y.o.   MRN: [id number]

## 2020-03-05 NOTE — Consult Note (Addendum)
[facility] Medical Consultation  SADARIUS NORMAN TJQ:[id number] DOB: [birth year] DOA: 03/04/2020 PCP: Dettinger, Fransisca Kaufmann, MD   Requesting physician: Dr Jeffie Pollock Date of consultation: 03/05/2020 Reason for consultation: lethargy, hypoxia   HPI:  86 year old male with history of chronic systolic CHF with EF of 76%, hypertension, history of A. fib/DVT/PE on chronic Coumadin, COPD, dementia, history of peripheral vascular disease status post left AKA, with history of bladder cancer currently admitted under urology service status post TURBT 11/23 was scheduled for discharge home today when he was found to be sleepy, more lethargic, and hypoxic requiring supplemental oxygen for which we were consulted.  On my evaluation patient is lethargic, he can wake up and tell me his name but drifts back to sleep.  Discussed with the bedside RN, he has been hard to wake up even after anesthesia yesterday but more so sleepy today.  Review of Systems:  Unable to obtain full review of systems due to lethargy   Impression/Recommendations  Principal problem Acute metabolic encephalopathy /lethargy -Broad differential at this point, can be postanesthesia, his vital signs are stable on supplemental oxygen.  Will obtain basic lab work as last CBC/CMP is 11/16, obtain a chest x-ray, ammonia, ABG.  Further work-up based on preliminary results -This could certainly be medication side effect but we need to rule out other causes first.  Has not taken any pain medications today.  Hold gabapentin tonight  Active problems Acute hypoxic respiratory failure -Patient did require BiPAP per RN postoperatively, has been on room air but became more hypoxic this afternoon currently requiring 2 L.  He has a history of systolic CHF with EF of 22%.  Stop IV fluids, follow chest x-ray and ABG, IV Lasix x 1,   Addendum: ABG results reviewed, patient has hypercarbic respiratory failure with respiratory acidosis.  Will transfer  to stepdown, placed on BiPAP, recheck ABG at 5 in the morning.  Make n.p.o.  Acute on chronic systolic CHF -Possibly slight element of fluid overload, he received his oral Lasix this morning which he is on continuous IV fluids.  Discontinue IV fluids, will dose IV Lasix x1 now  Chronic kidney disease stage IIIa -Baseline creatinine 1.2-1.3, most recent creatinine 11/16 was at baseline.  Repeat pending this afternoon  History of A. Fib/DVT/PE -Coumadin to be resumed per primary team postoperatively -We will add back on telemetry  History of COPD -No wheezing, check an ABG to rule out CO2 retention   Underlying dementia -Usually oriented to person and place, reported occasional hallucinations but per family he is relatively independent and able to do his ADLs   Our hospitalist team will followup again tomorrow. Thank you for this consultation.   Past Medical History:  Diagnosis Date  . Arthritis    "all over"  . Atrial fibrillation ([place])   . Benign localized prostatic hyperplasia with lower urinary tract symptoms (LUTS)   . Bradycardia 11/28/2015   Severe-HR in the 20s to 30s following intubation for urological procedure.  Marland Kitchen CAD- non obstructive disease by cath 3/14 cardiologist-  dr hochrein  . Chronic lower back pain   . COPD (chronic obstructive pulmonary disease) ([place])   . DDD (degenerative disc disease)   . Depression   . Dyspnea    w/ exertion and lying down (raise head)  . ETOH abuse   . GERD (gastroesophageal reflux disease)   . Glaucoma   . Glaucoma, both eyes   . History of bladder cancer urologist-- dr Jeffie Pollock   first  dx 2012--  s/p TURBT's  . History of DVT of lower extremity 03/2013   bilateral   . History of pulmonary embolus (PE) 04/2013  . HTN (hypertension)   . Hyperlipidemia   . Hypertension   . LBBB (left bundle branch block)    chronic  . Non-ischemic cardiomyopathy ([place])    last echo 01-18-2017, ef 35-40%  . NSVT (nonsustained ventricular  tachycardia) ([place])    a. NSVT 06/2012; NSVT also seen during 03/2013 adm. b. Med rx. Not candidate for ICD given adv age.  Marland Kitchen PAD (peripheral artery disease) ([place])   . 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 PVCs (premature ventricular contractions)   . PVD (peripheral vascular disease) ([facility])    Right ABI .75, Left .78 (2006)  . Syncope 06/2012   a. Felt to be postural syncope related to diuretics 06/2012.  Marland Kitchen Systolic and diastolic CHF, chronic (HCC) cardiologsit-  dr hochrein   a. NICM - patent cors 06/2012, EF 40% at that time. b. 03/2013 eval: EF 20-25%.  . Vision loss, left eye    due to glaucoma  . Wears glasses    Past Surgical History:  Procedure Laterality Date  . AMPUTATION Left 10/16/2014   Procedure: AMPUTATION ABOVE KNEE- LEFT;  Surgeon: Mal Misty, MD;  Location: [place] [facility];  Service: Vascular;  Laterality: Left;  . CARDIAC CATHETERIZATION  05-11-2004  DR [facility]   MINIMAL CORANARY PLAQUE/ NORMAL LVF/ EF 55%/  NON-OBSTRUCTIVE LAD 25%  . CARDIAC CATHETERIZATION  06/18/2012   dr hochrein   mild luminal irregularities of coronaries, ef 45-50%  . CARDIOVASCULAR STRESS TEST  10-15-2010   LOW RISK NUCLEAR STUDY/ NO EVIDENCE OF ISCHEMIA/ NORMAL EF  . CATARACT EXTRACTION W/ INTRAOCULAR LENS  IMPLANT, BILATERAL Bilateral   . CYSTOSCOPY  10/07/2011   Procedure: CYSTOSCOPY;  Surgeon: Malka So, MD;  Location[facility];  Service: Urology;  Laterality: N/A;  . CYSTOSCOPY N/A 10/20/2018   Procedure: CYSTOSCOPY WITH FULGURATION OF PROSTATIC URETHRAL TUMOR;  Surgeon: [name], MD;  Location: [facility];  Service: Urology;  Laterality: N/A;  . CYSTOSCOPY N/A 10/11/2019   Procedure: CYSTOSCOPY;  Surgeon: Cleon Gustin, MD;  Location: [facility];  Service: Urology;  Laterality: N/A;  . CYSTOSCOPY W/ RETROGRADES Bilateral 03/04/2020   Procedure: CYSTOSCOPY WITH BILATERAL RETROGRADE;   Surgeon: [name], MD;  Location: [facility];  Service: Urology;  Laterality: Bilateral;  . CYSTOSCOPY WITH BIOPSY  03/14/2012   Procedure: CYSTOSCOPY WITH BIOPSY;  Surgeon: Malka So, MD;  Location: [facility];  Service: Urology;  Laterality: N/A;  WITH FULGURATION  . CYSTOSCOPY WITH BIOPSY N/A 12/09/2016   Procedure: CYSTOSCOPY WITH BIOPSY AND FULGURATION;  Surgeon: [name], MD;  [facility];  Service: Urology;  Laterality: N/A;  . CYSTOSCOPY WITH BIOPSY N/A 12/20/2017   Procedure: CYSTOSCOPY WITH BIOPSY WITH FULGURATION;  Surgeon: [name], MD;  [facility];  Service: Urology;  Laterality: N/A;  . CYSTOSCOPY WITH BIOPSY N/A 06/09/2018   Procedure: CYSTOSCOPY WITH BIOPSY AND FULGURATION;  Surgeon: [name], MD;  Location[facility];  Service: Urology;  Laterality: N/A;  . CYSTOSCOPY WITH FULGERATION N/A 01/20/2016   Procedure: CYSTOSCOPY, BIOPSY WITH FULGERATION OF URTHRAL TUMOR;  Surgeon: [name], MD;  Location[facility];  Service: Urology;  Laterality: N/A;  . CYSTOSCOPY WITH FULGERATION N/A 06/01/2016   Procedure: CYSTOSCOPY WITH FULGERATION urethral tumors and  bladder neck;  Surgeon: [name], MD;  Location[facility];  Service: Urology;  Laterality: N/A;  . SHOULDER SURGERY Left 1970's  . TONSILLECTOMY    . TRANSTHORACIC ECHOCARDIOGRAM  01-18-2017   dr hochrein   moderate LVH, ef 35-40%, diffuse hypokinesis/  trivial AR and MR/  mild TR  . TRANSURETHRAL RESECTION OF BLADDER TUMOR  10/07/2011   Procedure: TRANSURETHRAL RESECTION OF BLADDER TUMOR (TURBT);  Surgeon: Malka So, MD;  Location: [facility];  Service: Urology;  Laterality: N/A;  . TRANSURETHRAL RESECTION OF BLADDER TUMOR N/A 07/10/2015   Procedure: TRANSURETHRAL RESECTION OF BLADDER TUMOR (TURBT), CYSTOSCOPY ;  Surgeon: [name], MD;  [facility];  Service: Urology;  Laterality: N/A;  . TRANSURETHRAL RESECTION OF BLADDER TUMOR N/A 10/11/2019   Procedure: TRANSURETHRAL RESECTION OF PROSTATIC  URETHRAL TUMOR;  Surgeon: Cleon Gustin, MD;  [facility];  Service: Urology;  Laterality: N/A;  . TRANSURETHRAL RESECTION OF BLADDER TUMOR WITH MITOMYCIN-C N/A 03/04/2020   Procedure: TRANSURETHRAL RESECTION OF BLADDER TUMOR WITH GEMCITABINE;  Surgeon: [name], MD;  [facility];  Service: Urology;  Laterality: N/A;  . TRANSURETHRAL RESECTION OF PROSTATE  03/14/2012   Procedure: TRANSURETHRAL RESECTION OF THE PROSTATE WITH GYRUS INSTRUMENTS;  Surgeon: Malka So, MD;  [facility];  Service: Urology;  Laterality: N/A;   Social History:  reports that he quit smoking about 44 years ago. His smoking use included cigarettes. He started smoking about 77 years ago. He has a 35.00 pack-year smoking history. He has never used smokeless tobacco. He reports previous alcohol use of about 7.0 standard drinks of alcohol per week. He reports that he does not use drugs.  Allergies  Allergen Reactions  . Rocephin [Ceftriaxone Sodium In Dextrose] Other (See Comments)    Recurrent seizures  . Lipitor [Atorvastatin] Hives and Rash   Family History  Problem Relation Age of Onset  . Hypertension Mother   . Arthritis Mother   . Lung cancer Sister   . Deep vein thrombosis Father   . Early death Brother     Prior to Admission medications   Medication Sig Start Date End Date Taking? Authorizing Provider  apixaban (ELIQUIS) 2.5 MG TABS tablet Take 1 tablet (2.5 mg total) by mouth 2 (two) times daily. 01/10/20  Yes Dettinger, Fransisca Kaufmann, MD  carvedilol (COREG) 6.25 MG tablet TAKE  (1)  TABLET TWICE A DAY. Patient taking differently: Take 6.25 mg by mouth 2 (two) times daily.  12/25/19  Yes Dettinger, Fransisca Kaufmann, MD  donepezil (ARICEPT) 10 MG tablet TAKE 1 TABLET AT BEDTIME 03/03/20  Yes Dettinger, Fransisca Kaufmann, MD  febuxostat (ULORIC) 40 MG tablet TAKE 1 TABLET DAILY Patient taking differently: Take 40 mg by mouth daily.  02/15/20  Yes Dettinger, Fransisca Kaufmann, MD  fluticasone (FLONASE) 50 MCG/ACT nasal  spray SPRAY 1 SPRAY IN EACH NOSTRIL ONCE DAILY. Patient taking differently: Place 1 spray into both nostrils daily as needed for allergies or rhinitis.  08/29/19  Yes Dettinger, Fransisca Kaufmann, MD  furosemide (LASIX) 20 MG tablet TAKE (1) TABLET DAILY IN THE MORNING. Patient taking differently: Take 20 mg by mouth daily.  02/05/20  Yes Dettinger, Fransisca Kaufmann, MD  gabapentin (NEURONTIN) 400 MG capsule Take 1 capsule (400 mg total) by mouth 2 (two) times daily. 02/08/20  Yes Dettinger, Fransisca Kaufmann, MD  MYRBETRIQ 25 MG TB24 tablet TAKE 1 TABLET DAILY AS DIRECTED Patient taking differently: Take 25 mg by mouth daily.  02/21/20  Yes McKenzie, Candee Furbish, MD  pantoprazole (PROTONIX) 40 MG tablet TAKE 1 TABLET DAILY 03/03/20  Yes Dettinger, Fransisca Kaufmann, MD  sertraline (ZOLOFT) 50 MG tablet TAKE (1) TABLET DAILY IN THE MORNING. Patient taking differently: Take 50 mg by mouth daily.  02/15/20  Yes Dettinger, Fransisca Kaufmann, MD  simvastatin (ZOCOR) 10 MG tablet TAKE 1 TABLET ONCE DAILY IN THE EVENING Patient taking differently: Take 10 mg by mouth every evening.  01/08/20  Yes Dettinger, Fransisca Kaufmann, MD  acetaminophen (TYLENOL) 500 MG tablet Take 1,000 mg by mouth every 6 (six) hours as needed for moderate pain or headache.    [provider]  albuterol (PROVENTIL) (2.5 MG/3ML) 0.083% nebulizer solution Take 2.5 mg by nebulization every 6 (six) hours as needed for wheezing or shortness of breath.    [provider]  albuterol (VENTOLIN HFA) 108 (90 Base) MCG/ACT inhaler Inhale 2 puffs into the lungs every 6 (six) hours as needed for wheezing or shortness of breath.    [provider]  Ipratropium-Albuterol (COMBIVENT RESPIMAT) 20-100 MCG/ACT AERS respimat Inhale 1 puff into the lungs every 6 (six) hours as needed for wheezing or shortness of breath (As needed for wheezing , cough or shortness of breath). Patient taking differently: Inhale 2 puffs into the lungs daily.  07/09/19   Roxan Hockey, MD    Physical Exam: Blood pressure (!) 104/54, pulse 76, temperature (!) 97.5 F (36.4 C), temperature source Oral, resp. rate 20, height 5\' 6"  (1.676 m), weight 59.4 kg, SpO2 99 %. Vitals:   03/05/20 1355 03/05/20 1403  BP:  (!) 104/54  Pulse:  76  Resp:  20  Temp:  (!) 97.5 F (36.4 C)  SpO2: 92% 99%     General: Lethargic, in bed, no apparent distress but visibly tachypneic  Eyes: No scleral icterus  ENT: Moist mucous membranes  Neck: Supple, no masses  Cardiovascular: Heart appears regular, no significant murmurs appreciated, no peripheral edema  Respiratory: Diminished at the bases, no obvious wheezing, moves air well bilaterally, tachypneic  Abdomen: Soft, no tenderness appreciated, no guarding or rebound, bowel sounds positive  Skin: No rashes seen  Musculoskeletal: No peripheral edema  Psychiatric: Alert to self only but lethargic  Neurologic: No focal deficits, lethargic at times and not fully following commands  Labs on Admission:  Basic Metabolic Panel: No results for input(s): NA, K, CL, CO2, GLUCOSE, BUN, CREATININE, CALCIUM, MG, PHOS in the last 168 hours. Liver Function Tests: No results for input(s): AST, ALT, ALKPHOS, BILITOT, PROT, ALBUMIN in the last 168 hours. No results for input(s): LIPASE, AMYLASE in the last 168 hours. No results for input(s): AMMONIA in the last 168 hours. CBC: No results for input(s): WBC, NEUTROABS, HGB, HCT, MCV, PLT in the last 168 hours. Cardiac Enzymes: No results for input(s): CKTOTAL, CKMB, CKMBINDEX, TROPONINI in the last 168 hours. BNP: Invalid input(s): POCBNP CBG: No results for input(s): GLUCAP in the last 168 hours.  Radiological Exams on Admission: DG C-Arm 1-60 Min-No Report  Result Date: 03/04/2020 Fluoroscopy was utilized by the requesting physician.  No radiographic interpretation.    Time spent: 45 minutes  [facility]  If 7PM-7AM, please contact  night-coverage [email or web address]  03/05/2020, 5:11 PM

## 2020-03-05 NOTE — Progress Notes (Signed)
CRITICAL VALUE ALERT  Critical Value:  CO2 68.3  Date & Time Notied:  03/05/20 @ 0735  Provider Notified: Caren Griffins.  Orders Received/Actions taken: Orders to transfer to step down.

## 2020-03-05 NOTE — Progress Notes (Signed)
Patient ID: Scott [name], male   DOB: 11-08-1921, 86 y.o.   MRN: [id number]  I was called to assess patient this afternoon after Dr. Jeffie Pollock had discharged him earlier.  However, his oxygen saturation levels were below 80% on RA.  Back over 92% on 2 L.  He continues to be somnolent as well.  He denies pain and has been voiding.    Will check CXR and SL IV.  Plan to wean oxygen and discharge tomorrow morning if improved.

## 2020-03-06 DIAGNOSIS — I10 Essential (primary) hypertension: Secondary | ICD-10-CM | POA: Diagnosis not present

## 2020-03-06 DIAGNOSIS — E872 Acidosis: Secondary | ICD-10-CM

## 2020-03-06 DIAGNOSIS — I5023 Acute on chronic systolic (congestive) heart failure: Secondary | ICD-10-CM | POA: Diagnosis not present

## 2020-03-06 LAB — COMPREHENSIVE METABOLIC PANEL
ALT: 12 U/L (ref 0–44)
AST: 17 U/L (ref 15–41)
Albumin: 3.7 g/dL (ref 3.5–5.0)
Alkaline Phosphatase: 131 U/L — ABNORMAL HIGH (ref 38–126)
Anion gap: 10 (ref 5–15)
BUN: 35 mg/dL — ABNORMAL HIGH (ref 8–23)
CO2: 26 mmol/L (ref 22–32)
Calcium: 8.7 mg/dL — ABNORMAL LOW (ref 8.9–10.3)
Chloride: 104 mmol/L (ref 98–111)
Creatinine, Ser: 1.52 mg/dL — ABNORMAL HIGH (ref 0.61–1.24)
GFR, Estimated: 42 mL/min — ABNORMAL LOW (ref >60)
Glucose, Bld: 68 mg/dL — ABNORMAL LOW (ref 70–99)
Potassium: 4.4 mmol/L (ref 3.5–5.1)
Sodium: 140 mmol/L (ref 135–145)
Total Bilirubin: 1 mg/dL (ref 0.3–1.2)
Total Protein: 6.5 g/dL (ref 6.5–8.1)

## 2020-03-06 LAB — GLUCOSE, CAPILLARY
Glucose-Capillary: 55 mg/dL — ABNORMAL LOW (ref 70–99)
Glucose-Capillary: 84 mg/dL (ref 70–99)
Glucose-Capillary: 79 mg/dL (ref 70–99)
Glucose-Capillary: 75 mg/dL (ref 70–99)
Glucose-Capillary: 63 mg/dL — ABNORMAL LOW (ref 70–99)
Glucose-Capillary: 55 mg/dL — ABNORMAL LOW (ref 70–99)
Glucose-Capillary: 93 mg/dL (ref 70–99)
Glucose-Capillary: 114 mg/dL — ABNORMAL HIGH (ref 70–99)

## 2020-03-06 LAB — CBC
HCT: 40.3 % (ref 39.0–52.0)
Hemoglobin: 11.8 g/dL — ABNORMAL LOW (ref 13.0–17.0)
MCH: 27 pg (ref 26.0–34.0)
MCHC: 29.3 g/dL — ABNORMAL LOW (ref 30.0–36.0)
MCV: 92.2 fL (ref 80.0–100.0)
Platelets: 127 10*3/uL — ABNORMAL LOW (ref 150–400)
RBC: 4.37 MIL/uL (ref 4.22–5.81)
RDW: 16.9 % — ABNORMAL HIGH (ref 11.5–15.5)
WBC: 4.4 10*3/uL (ref 4.0–10.5)
nRBC: 0 % (ref 0.0–0.2)

## 2020-03-06 LAB — BLOOD GAS, ARTERIAL
Acid-Base Excess: 1.1 mmol/L (ref 0.0–2.0)
Bicarbonate: 28.5 mmol/L — ABNORMAL HIGH (ref 20.0–28.0)
FIO2: 28
O2 Saturation: 91.8 %
Patient temperature: 98.8
pCO2 arterial: 62.5 mmHg — ABNORMAL HIGH (ref 32.0–48.0)
pH, Arterial: 7.281 — ABNORMAL LOW (ref 7.350–7.450)
pO2, Arterial: 67.5 mmHg — ABNORMAL LOW (ref 83.0–108.0)

## 2020-03-06 LAB — MAGNESIUM: Magnesium: 2.1 mg/dL (ref 1.7–2.4)

## 2020-03-06 MED ORDER — GLUCOSE 40 % PO GEL
ORAL | Status: AC
  Administered 2020-03-06: 37.5 g via ORAL
  Filled 2020-03-06: qty 1

## 2020-03-06 MED ORDER — DEXTROSE 50 % IV SOLN
INTRAVENOUS | Status: AC
  Filled 2020-03-06: qty 50

## 2020-03-06 MED ORDER — LORAZEPAM 2 MG/ML IJ SOLN
0.5000 mg | Freq: Once | INTRAMUSCULAR | Status: AC
  Administered 2020-03-06: 0.5 mg via INTRAVENOUS
  Filled 2020-03-06: qty 1

## 2020-03-06 MED ORDER — FUROSEMIDE 10 MG/ML IJ SOLN
20.0000 mg | Freq: Every day | INTRAMUSCULAR | Status: DC
  Administered 2020-03-06 – 2020-03-07 (×2): 20 mg via INTRAVENOUS
  Filled 2020-03-06 (×2): qty 2

## 2020-03-06 MED ORDER — DEXTROSE 50 % IV SOLN: 12.5000 g | INTRAVENOUS | Status: DC

## 2020-03-06 MED ORDER — GLUCOSE 40 % PO GEL: 1.0000 | ORAL | Status: AC

## 2020-03-06 MED ORDER — DEXTROSE 50 % IV SOLN
12.5000 g | INTRAVENOUS | Status: AC
  Administered 2020-03-06: 12.5 g via INTRAVENOUS
  Filled 2020-03-06: qty 50

## 2020-03-06 NOTE — Progress Notes (Signed)
Hypoglycemic Event  CBG: 55  Treatment: 12.5 g D50 IV  Symptoms: decreased LOC, decr orientation  Follow-up CBG: Time: 0523 CBG Result: 84  Possible Reasons for Event: NPO, no fluids running  Comments/MD notified: paged provider    [facility] Karl Luke

## 2020-03-06 NOTE — Progress Notes (Addendum)
PROGRESS NOTE  Scott [name] RWE:[id number] DOB: 1921-10-16 DOA: 03/04/2020 PCP: Dettinger, Fransisca Kaufmann, MD   LOS: 1 day   Brief Narrative / Interim history: 86 year old male with history of chronic systolic CHF with EF of 61%, hypertension, history of A. fib/DVT/PE on chronic Coumadin, COPD, dementia, history of peripheral vascular disease status post left AKA, with history of bladder cancer currently admitted under urology service status post TURBT 11/23 was scheduled for discharge 11/24 when he was found to be more lethargic. An ABG showed hypercarbic respiratory failure with respiratory acidosis and he was placed on BiPAP and transferred to stepdown  Subjective / 24h Interval events: BiPAP on, opens his eyes but still appears lethargic  Assessment & Plan: Principal Problem Acute metabolic encephalopathy/lethargy due to acute hypoxic and hypercarbic respiratory failure with respiratory acidosis -ABG earlier this morning slightly improved but remains acidotic, continue BiPAP, hopefully as he wakes up more during the day he can come off of the BiPAP and ideally sit up in the chair -Continue IV Lasix  Active Problems Chronic kidney disease stage IIIa -Baseline 1.2-1.3, around 1.5 and stable.  Monitor while on Lasix  Acute on chronic systolic CHF -Since he is n.p.o. due to BiPAP convert oral Lasix to IV today  History of A. fib/DVT/PE -Resume Coumadin per primary team  History of COPD -With CO2 retention possibly perioperative, continue bipap  Underlying dementia -Usually oriented to person and place, son tells me he is remarkably independent and able to do his ADLs  Status post TURBT 11/23 -Per primary  Scheduled Meds: . carvedilol  6.25 mg Oral BID  . chlorhexidine  15 mL Mouth Rinse BID  . Chlorhexidine Gluconate Cloth  6 each Topical Daily  . donepezil  10 mg Oral QHS  . febuxostat  40 mg Oral Daily  . furosemide  20 mg Intravenous Daily  . Ipratropium-Albuterol  2 puff  Inhalation Daily  . mouth rinse  15 mL Mouth Rinse BID  . mirabegron ER  25 mg Oral Daily  . pantoprazole  40 mg Oral Daily  . sertraline  50 mg Oral Daily  . simvastatin  10 mg Oral QPM   Continuous Infusions: PRN Meds:.acetaminophen, albuterol, bisacodyl, fluticasone, HYDROcodone-acetaminophen, HYDROmorphone (DILAUDID) injection, ondansetron, oxybutynin, senna-docusate, sodium phosphate  Diet Orders (From admission, onward)    Start     Ordered   03/05/20 1833  Diet NPO time specified  Diet effective now        03/05/20 1832          DVT prophylaxis: SCDs Start: 03/04/20 1216     Code Status: DNR  Family Communication: son over the phone  Status is: Inpatient  Remains inpatient appropriate because:Inpatient level of care appropriate due to severity of illness   Dispo: The patient is from: Home              Anticipated d/c is to: Home              Anticipated d/c date is: 3 days              Patient currently is not medically stable to d/c.  Procedures:  TURBT 11/23  Microbiology  None   Antimicrobials: None     Objective: Vitals:   03/06/20 0400 03/06/20 0522 03/06/20 0800 03/06/20 0843  BP: 138/76  123/66   Pulse: 72 73    Resp:  13 19 16   Temp: 98.4 F (36.9 C)  98.7 F (37.1 C)   TempSrc: Oral  Axillary   SpO2: 100% 98% 100% 100%  Weight:      Height:        Intake/Output Summary (Last 24 hours) at 03/06/2020 0951 Last data filed at 03/06/2020 0400 Gross per 24 hour  Intake 806.64 ml  Output 565 ml  Net 241.64 ml   Filed Weights   03/04/20 0854  Weight: 59.4 kg    Examination:  Constitutional: NAD Eyes: no scleral icterus ENMT: Mucous membranes are moist.  Neck: normal, supple Respiratory: clear to auscultation bilaterally, no wheezing, no crackles. Normal respiratory effort.  Cardiovascular: Regular rate and rhythm, no murmurs / rubs / gallops. Abdomen: non distended, no tenderness. Bowel sounds positive.  Musculoskeletal: no  clubbing / cyanosis.  Skin: no rashes Neurologic: moves all 4 spontaneously.   Data Reviewed: I have independently reviewed following labs and imaging studies   CBC: Recent Labs  Lab 03/05/20 1813 03/06/20 0257  WBC 5.3 4.4  HGB 12.0* 11.8*  HCT 40.7 40.3  MCV 92.1 92.2  PLT 137* 373*   Basic Metabolic Panel: Recent Labs  Lab 03/05/20 1813 03/06/20 0257  NA 139 140  K 5.0 4.4  CL 104 104  CO2 24 26  GLUCOSE 84 68*  BUN 34* 35*  CREATININE 1.55* 1.52*  CALCIUM 8.8* 8.7*  MG  --  2.1   Liver Function Tests: Recent Labs  Lab 03/05/20 1813 03/06/20 0257  AST 18 17  ALT 13 12  ALKPHOS 139* 131*  BILITOT 0.9 1.0  PROT 6.6 6.5  ALBUMIN 3.8 3.7   Coagulation Profile: No results for input(s): INR, PROTIME in the last 168 hours. HbA1C: No results for input(s): HGBA1C in the last 72 hours. CBG: Recent Labs  Lab 03/06/20 0433 03/06/20 0523 03/06/20 0743  GLUCAP 55* 84 79    Recent Results (from the past 240 hour(s))  SARS CORONAVIRUS 2 (TAT 6-24 HRS) Nasopharyngeal Nasopharyngeal Swab     Status: None   Collection Time: 02/29/20  1:10 PM   Specimen: Nasopharyngeal Swab  Result Value Ref Range Status   SARS Coronavirus 2 NEGATIVE NEGATIVE Final    Comment: (NOTE) SARS-CoV-2 target nucleic acids are NOT DETECTED.  The SARS-CoV-2 RNA is generally detectable in upper and lower respiratory specimens during the acute phase of infection. Negative results do not preclude SARS-CoV-2 infection, do not rule out co-infections with other pathogens, and should not be used as the sole basis for treatment or other patient management decisions. Negative results must be combined with clinical observations, patient history, and epidemiological information. The expected result is Negative.  Fact Sheet for Patients: SugarRoll.be  Fact Sheet for Healthcare Providers: https://www.woods-mathews.com/  This test is not yet approved or  cleared by the [place] FDA and  has been authorized for detection and/or diagnosis of SARS-CoV-2 by FDA under an Emergency Use Authorization (EUA). This EUA will remain  in effect (meaning this test can be used) for the duration of the COVID-19 declaration under Se ction 564(b)(1) of the Act, 21 U.S.C. section 360bbb-3(b)(1), unless the authorization is terminated or revoked sooner.  Performed at [facility], [place] [address], [place], [place] [address]   MRSA PCR Screening     Status: None   Collection Time: 03/05/20  7:18 PM   Specimen: Nasal Mucosa; Nasopharyngeal  Result Value Ref Range Status   MRSA by PCR NEGATIVE NEGATIVE Final    Comment:        The GeneXpert MRSA Assay (FDA approved for NASAL specimens only),  is one component of a comprehensive MRSA colonization surveillance program. It is not intended to diagnose MRSA infection nor to guide or monitor treatment for MRSA infections. Performed at [facility], [place] [address] [place], [place] [address]      Radiology Studies: DG Chest Portable 2 Views  Result Date: 03/05/2020 CLINICAL DATA:  86 year old male with hypoxia. EXAM: CHEST  2 VIEW PORTABLE COMPARISON:  Chest radiograph dated 02/17/2020. FINDINGS: Faint bilateral peripheral and subpleural predominant densities may be chronic but concerning for developing infiltrate, possibly viral or atypical in etiology. Clinical correlation recommended. Probable small left pleural effusion and left lung base atelectasis or infiltrate. No pneumothorax. Evaluation however is limited due to superimposition of the patient's mandible over the upper lungs. Stable cardiomediastinal silhouette. Atherosclerotic calcification of the aorta. No acute osseous pathology. IMPRESSION: 1. Faint pulmonary densities may be chronic or represent developing atypical infection. Clinical correlation recommended. 2. Probable small left pleural effusion and left lung base  atelectasis or infiltrate. Electronically Signed   By: Anner Crete M.D.   On: 03/05/2020 17:16    Marzetta Board, MD, PhD [facility]  Between 7 am - 7 pm I am available, please contact me via Amion or Securechat  Between 7 pm - 7 am I am not available, please contact night coverage MD/APP via Amion

## 2020-03-06 NOTE — Progress Notes (Signed)
2 Days Post-Op  Subjective: Scott [name] is confused and difficult to arouse.  His urine is light pink. Off CBI. Patient getting BiPAP for hypercarbia/hypoxia.  He has done well in step down overnight  ROS:  Review of Systems  All other systems reviewed and are negative.   Anti-infectives: Anti-infectives (From admission, onward)   Start     Dose/Rate Route Frequency Ordered Stop   03/04/20 0856  ciprofloxacin (CIPRO) 400 MG/200ML IVPB       Note to Pharmacy: Kyra Leyland   : cabinet override      03/04/20 0856 03/04/20 1059   03/04/20 0853  ciprofloxacin (CIPRO) IVPB 400 mg        400 mg 200 mL/hr over 60 Minutes Intravenous 60 min pre-op 03/04/20 0853 03/04/20 1038      Current Facility-Administered Medications  Medication Dose Route Frequency Provider Last Rate Last Admin  . acetaminophen (TYLENOL) tablet 650 mg  650 mg Oral Q4H PRN [name], MD      . albuterol (PROVENTIL) (2.5 MG/3ML) 0.083% nebulizer solution 2.5 mg  2.5 mg Nebulization Q6H PRN [name], MD      . bisacodyl (DULCOLAX) suppository 10 mg  10 mg Rectal Daily PRN [name], MD      . carvedilol (COREG) tablet 6.25 mg  6.25 mg Oral BID [name], MD   6.25 mg at 03/05/20 0915  . chlorhexidine (PERIDEX) 0.12 % solution 15 mL  15 mL Mouth Rinse BID [name], MD   15 mL at 03/06/20 1044  . Chlorhexidine Gluconate Cloth 2 % PADS 6 each  6 each Topical Daily [name], MD   6 each at 03/05/20 1900  . donepezil (ARICEPT) tablet 10 mg  10 mg Oral QHS [name], MD   10 mg at 03/04/20 2111  . febuxostat (ULORIC) tablet 40 mg  40 mg Oral Daily [name], MD   40 mg at 03/05/20 0915  . fluticasone (FLONASE) 50 MCG/ACT nasal spray 1 spray  1 spray Each Nare Daily PRN [name], MD      . furosemide (LASIX) injection 20 mg  20 mg Intravenous Daily Caren Griffins, MD   20 mg at 03/06/20 1047  . HYDROcodone-acetaminophen (NORCO/VICODIN) 5-325 MG per tablet 1-2 tablet  1-2 tablet Oral Q4H PRN [name],  MD      . HYDROmorphone (DILAUDID) injection 0.5-1 mg  0.5-1 mg Intravenous Q2H PRN [name], MD      . Ipratropium-Albuterol (COMBIVENT) respimat 2 puff  2 puff Inhalation Daily [name], MD   2 puff at 03/05/20 0800  . MEDLINE mouth rinse  15 mL Mouth Rinse BID Barb Merino, MD      . mirabegron ER [facility]) tablet 25 mg  25 mg Oral Daily [name], MD   25 mg at 03/05/20 0915  . ondansetron (ZOFRAN) injection 4 mg  4 mg Intravenous Q4H PRN [name], MD      . oxybutynin (DITROPAN) tablet 5 mg  5 mg Oral Q8H PRN [name], MD      . pantoprazole (PROTONIX) EC tablet 40 mg  40 mg Oral Daily [name], MD   40 mg at 03/05/20 0915  . senna-docusate (Senokot-S) tablet 1 tablet  1 tablet Oral QHS PRN [name], MD      . sertraline (ZOLOFT) tablet 50 mg  50 mg Oral Daily [name], MD   50 mg at 03/05/20 0915  . simvastatin (ZOCOR) tablet 10 mg  10 mg Oral QPM [name], MD   10 mg at 03/05/20 1811  . sodium phosphate (FLEET) 7-19 GM/118ML enema 1 enema  1 enema Rectal Once PRN [name], MD         Objective: Vital signs in last 24 hours: Temp:  [97.5 F (36.4 C)-98.8 F (37.1 C)] 97.5 F (36.4 C) (11/25 1200) Pulse Rate:  [67-76] 73 (11/25 0522) Resp:  [13-20] 13 (11/25 1300) BP: (104-169)/(45-110) 169/60 (11/25 1300) SpO2:  [77 %-100 %] 100 % (11/25 0843) FiO2 (%):  [28 %] 28 % (11/25 0400)  Intake/Output from previous day: 11/24 0701 - 11/25 0700 In: 926.6 [P.O.:120; I.V.:806.6] Out: 765 [Urine:765] Intake/Output this shift: No intake/output data recorded.   Physical Exam Vitals reviewed.  Constitutional:      Appearance: Normal appearance.  Cardiovascular:     Rate and Rhythm: Normal rate and regular rhythm.  Pulmonary:     Effort: Pulmonary effort is normal. No respiratory distress.     Breath sounds: Normal breath sounds.  Neurological:     Mental Status: He is alert.     Lab Results:  Recent Labs    03/05/20 1813 03/06/20 0257  WBC  5.3 4.4  HGB 12.0* 11.8*  HCT 40.7 40.3  PLT 137* 127*   BMET Recent Labs    03/05/20 1813 03/06/20 0257  NA 139 140  K 5.0 4.4  CL 104 104  CO2 24 26  GLUCOSE 84 68*  BUN 34* 35*  CREATININE 1.55* 1.52*  CALCIUM 8.8* 8.7*   PT/INR No results for input(s): LABPROT, INR in the last 72 hours. ABG Recent Labs    03/05/20 1752 03/06/20 0411  PHART 7.235* 7.281*  HCO3 27.9 28.5*    Studies/Results: DG Chest Portable 2 Views  Result Date: 03/05/2020 CLINICAL DATA:  86 year old male with hypoxia. EXAM: CHEST  2 VIEW PORTABLE COMPARISON:  Chest radiograph dated 02/17/2020. FINDINGS: Faint bilateral peripheral and subpleural predominant densities may be chronic but concerning for developing infiltrate, possibly viral or atypical in etiology. Clinical correlation recommended. Probable small left pleural effusion and left lung base atelectasis or infiltrate. No pneumothorax. Evaluation however is limited due to superimposition of the patient's mandible over the upper lungs. Stable cardiomediastinal silhouette. Atherosclerotic calcification of the aorta. No acute osseous pathology. IMPRESSION: 1. Faint pulmonary densities may be chronic or represent developing atypical infection. Clinical correlation recommended. 2. Probable small left pleural effusion and left lung base atelectasis or infiltrate. Electronically Signed   By: Anner Crete M.D.   On: 03/05/2020 17:16     Assessment and Plan: Multifocal bladder cancer s/p TURBT. Management of hypoxia/hypercarbia per Hospitalist.      LOS: 1 day    Nicolette Bang 03/06/2020 [phone] ID: Scott [name], male   DOB: December 26, 1921, 86 y.o.   MRN: [id number]

## 2020-03-06 NOTE — Progress Notes (Signed)
CBG 63 at 1618. 4oz orange juice given, follow up CBG 55. Glucose gel given per standing order & encouraged dinner intake; CBG 15 minutes post glucose gel 93.

## 2020-03-07 DIAGNOSIS — C679 Malignant neoplasm of bladder, unspecified: Secondary | ICD-10-CM | POA: Diagnosis not present

## 2020-03-07 DIAGNOSIS — E872 Acidosis: Secondary | ICD-10-CM | POA: Diagnosis not present

## 2020-03-07 LAB — BLOOD GAS, ARTERIAL
Acid-Base Excess: 7.5 mmol/L — ABNORMAL HIGH (ref 0.0–2.0)
Bicarbonate: 33 mmol/L — ABNORMAL HIGH (ref 20.0–28.0)
FIO2: 28
O2 Saturation: 86.8 %
Patient temperature: 98.6
pCO2 arterial: 52.1 mmHg — ABNORMAL HIGH (ref 32.0–48.0)
pH, Arterial: 7.418 (ref 7.350–7.450)
pO2, Arterial: 52.4 mmHg — ABNORMAL LOW (ref 83.0–108.0)

## 2020-03-07 LAB — GLUCOSE, CAPILLARY
Glucose-Capillary: 105 mg/dL — ABNORMAL HIGH (ref 70–99)
Glucose-Capillary: 84 mg/dL (ref 70–99)
Glucose-Capillary: 87 mg/dL (ref 70–99)
Glucose-Capillary: 93 mg/dL (ref 70–99)
Glucose-Capillary: 98 mg/dL (ref 70–99)

## 2020-03-07 MED ORDER — FUROSEMIDE 20 MG PO TABS
20.0000 mg | ORAL_TABLET | Freq: Every day | ORAL | Status: DC
  Administered 2020-03-08 – 2020-03-09 (×2): 20 mg via ORAL
  Filled 2020-03-07 (×2): qty 1

## 2020-03-07 MED ORDER — HYDRALAZINE HCL 10 MG PO TABS
10.0000 mg | ORAL_TABLET | Freq: Four times a day (QID) | ORAL | Status: DC | PRN
  Administered 2020-03-07: 10 mg via ORAL
  Filled 2020-03-07: qty 1

## 2020-03-07 NOTE — Evaluation (Signed)
Physical Therapy Evaluation Patient Details Name: Scott [name] MRN: [id number] DOB: [birth year] Today's Date: 03/07/2020   History of Present Illness  Pt admitted with recurrent urothelial CA and now s/p Large bladder tumor resection 03/04/20.  Pt with hx of Bil eye glaucoma, PVD, Non-ischemic cardiomyopathy, LBBB, DDD, Etoh abuse, CAD, a-fib and L AKA (2016)  Clinical Impression  Pt admitted as above and presenting with functional mobility limitations 2* generalized weakness, balance deficits, L AKA and cognitive impairment.  Pt hopes to dc home to previous living arrangement with 24/7 PCA.  This date, pt agreeable to sitting EOB and initially agreeable to transferring to recliner but upon sitting at EOB declined to attempt despite multiple attempts.    Follow Up Recommendations Home health PT    Equipment Recommendations  None recommended by PT    Recommendations for Other Services       Precautions / Restrictions Precautions Precautions: Fall Restrictions Weight Bearing Restrictions: No      Mobility  Bed Mobility Overal bed mobility: Needs Assistance Bed Mobility: Supine to Sit;Sit to Supine     Supine to sit: Mod assist Sit to supine: Mod assist   General bed mobility comments: assist to manage R LE and control trunk; bed pad utilized to assist pt to rotate to/from EOB    Transfers                 General transfer comment: Pt initially stating he would like to get to recliner but upon sitting up on side of bed refused to attempt multiple times  Ambulation/Gait                Stairs            Wheelchair Mobility    Modified Rankin (Stroke Patients Only)       Balance Overall balance assessment: Needs assistance Sitting-balance support: Single extremity supported Sitting balance-Leahy Scale: Fair                                       Pertinent Vitals/Pain Pain Assessment: No/denies pain    Home Living  Family/patient expects to be discharged to:: Private residence Living Arrangements: Alone Available Help at Discharge: Personal care attendant;Available 24 hours/day Type of Home: House Home Access: Ramped entrance     Home Layout: One level Home Equipment: Wheelchair - power;Walker - 2 wheels;Shower seat;Shower seat - built in;Bedside commode Additional Comments: Pt reports PCA 24 hrs/day    Prior Function Level of Independence: Needs assistance   Gait / Transfers Assistance Needed: Non-ambulatory, uses power wheelchair for mobility, Mod Indep stand pivot transfers  ADL's / Homemaking Assistance Needed: assisted for bathing, dressing and household ADLs by paid attendants 24/7        Hand Dominance   Dominant Hand: Right    Extremity/Trunk Assessment   Upper Extremity Assessment Upper Extremity Assessment: Generalized weakness    Lower Extremity Assessment Lower Extremity Assessment: Generalized weakness;LLE deficits/detail LLE Deficits / Details: AKA       Communication   Communication: HOH  Cognition Arousal/Alertness: Awake/alert Behavior During Therapy: Flat affect Overall Cognitive Status: History of cognitive impairments - at baseline                                 General Comments: Pt combatative over-night but more pleasant and cooperative  this pm      General Comments      Exercises     Assessment/Plan    PT Assessment Patient needs continued PT services  PT Problem List Decreased strength;Decreased activity tolerance;Decreased balance;Decreased mobility;Decreased cognition;Decreased knowledge of use of DME;Decreased safety awareness       PT Treatment Interventions DME instruction;Functional mobility training;Therapeutic activities;Therapeutic exercise;Balance training;Patient/family education    PT Goals (Current goals can be found in the Care Plan section)  Acute Rehab PT Goals Patient Stated Goal: HOME PT Goal Formulation:  With patient Time For Goal Achievement: 03/21/20 Potential to Achieve Goals: Fair    Frequency Min 3X/week   Barriers to discharge        Co-evaluation               AM-PAC PT "6 Clicks" Mobility  Outcome Measure Help needed turning from your back to your side while in a flat bed without using bedrails?: A Little Help needed moving from lying on your back to sitting on the side of a flat bed without using bedrails?: A Lot Help needed moving to and from a bed to a chair (including a wheelchair)?: A Lot Help needed standing up from a chair using your arms (e.g., wheelchair or bedside chair)?: A Lot Help needed to walk in hospital room?: Total Help needed climbing 3-5 steps with a railing? : Total 6 Click Score: 11    End of Session Equipment Utilized During Treatment: Gait belt Activity Tolerance: Patient tolerated treatment well Patient left: in bed;with call bell/phone within reach;with bed alarm set;with family/visitor present Nurse Communication: Mobility status PT Visit Diagnosis: Difficulty in walking, not elsewhere classified (R26.2);Muscle weakness (generalized) (M62.81)    Time: 1457-1530 PT Time Calculation (min) (ACUTE ONLY): 33 min   Charges:   PT Evaluation $PT Eval Low Complexity: 1 Low PT Treatments $Therapeutic Activity: 8-22 mins        Debe Coder PT Acute Rehabilitation Services Pager [phone] Office [phone]   Vasil Juhasz 03/07/2020, 4:31 PM

## 2020-03-07 NOTE — Progress Notes (Signed)
3 Days Post-Op  Subjective: Mr. Brodzinski is alert today and less confused.  His urine is light pink. Off CBI. Patient refused BiPAP overnight.   ROS:  Review of Systems  All other systems reviewed and are negative.   Anti-infectives: Anti-infectives (From admission, onward)   Start     Dose/Rate Route Frequency Ordered Stop   03/04/20 0856  ciprofloxacin (CIPRO) 400 MG/200ML IVPB       Note to Pharmacy: Kyra Leyland   : cabinet override      03/04/20 0856 03/04/20 1059   03/04/20 0853  ciprofloxacin (CIPRO) IVPB 400 mg        400 mg 200 mL/hr over 60 Minutes Intravenous 60 min pre-op 03/04/20 0853 03/04/20 1038      Current Facility-Administered Medications  Medication Dose Route Frequency Provider Last Rate Last Admin  . acetaminophen (TYLENOL) tablet 650 mg  650 mg Oral Q4H PRN [name], MD      . albuterol (PROVENTIL) (2.5 MG/3ML) 0.083% nebulizer solution 2.5 mg  2.5 mg Nebulization Q6H PRN [name], MD      . bisacodyl (DULCOLAX) suppository 10 mg  10 mg Rectal Daily PRN [name], MD      . carvedilol (COREG) tablet 6.25 mg  6.25 mg Oral BID [name], MD   6.25 mg at 03/07/20 0944  . chlorhexidine (PERIDEX) 0.12 % solution 15 mL  15 mL Mouth Rinse BID [name], MD   15 mL at 03/07/20 0946  . Chlorhexidine Gluconate Cloth 2 % PADS 6 each  6 each Topical Daily [name], MD   6 each at 03/07/20 [phone]  . donepezil (ARICEPT) tablet 10 mg  10 mg Oral QHS [name], MD   10 mg at 03/04/20 2111  . febuxostat (ULORIC) tablet 40 mg  40 mg Oral Daily [name], MD   40 mg at 03/07/20 0944  . fluticasone (FLONASE) 50 MCG/ACT nasal spray 1 spray  1 spray Each Nare Daily PRN [name], MD      . Derrill Memo ON 03/08/2020] furosemide (LASIX) tablet 20 mg  20 mg Oral Daily Gherghe, Costin M, MD      . hydrALAZINE (APRESOLINE) tablet 10 mg  10 mg Oral Q6H PRN Caren Griffins, MD   10 mg at 03/07/20 1721  . HYDROcodone-acetaminophen (NORCO/VICODIN) 5-325 MG per tablet 1-2  tablet  1-2 tablet Oral Q4H PRN [name], MD      . HYDROmorphone (DILAUDID) injection 0.5-1 mg  0.5-1 mg Intravenous Q2H PRN [name], MD      . MEDLINE mouth rinse  15 mL Mouth Rinse BID Barb Merino, MD   15 mL at 03/07/20 0959  . mirabegron ER (MYRBETRIQ) tablet 25 mg  25 mg Oral Daily [name], MD   25 mg at 03/07/20 0947  . ondansetron (ZOFRAN) injection 4 mg  4 mg Intravenous Q4H PRN [name], MD      . oxybutynin (DITROPAN) tablet 5 mg  5 mg Oral Q8H PRN [name], MD      . pantoprazole (PROTONIX) EC tablet 40 mg  40 mg Oral Daily [name], MD   40 mg at 03/07/20 0945  . senna-docusate (Senokot-S) tablet 1 tablet  1 tablet Oral QHS PRN [name], MD      . sertraline (ZOLOFT) tablet 50 mg  50 mg Oral Daily [name], MD   50 mg at 03/07/20 0945  . simvastatin (ZOCOR) tablet 10 mg  10 mg Oral QPM  [name], MD   10 mg at 03/07/20 1721  . sodium phosphate (FLEET) 7-19 GM/118ML enema 1 enema  1 enema Rectal Once PRN [name], MD         Objective: Vital signs in last 24 hours: Temp:  [97.8 F (36.6 C)-98.3 F (36.8 C)] 98 F (36.7 C) (11/26 1600) Pulse Rate:  [70-82] 70 (11/26 1700) Resp:  [15-27] 16 (11/26 1800) BP: (101-187)/(37-139) 160/98 (11/26 1800) SpO2:  [94 %-100 %] 100 % (11/26 1800) FiO2 (%):  [28 %] 28 % (11/25 2009)  Intake/Output from previous day: 11/25 0701 - 11/26 0700 In: 480 [P.O.:480] Out: 1075 [Urine:1075] Intake/Output this shift: No intake/output data recorded.   Physical Exam Vitals reviewed.  Constitutional:      Appearance: Normal appearance.  Cardiovascular:     Rate and Rhythm: Normal rate and regular rhythm.  Pulmonary:     Effort: Pulmonary effort is normal. No respiratory distress.     Breath sounds: Normal breath sounds.  Neurological:     Mental Status: He is alert.     Lab Results:  Recent Labs    03/05/20 1813 03/06/20 0257  WBC 5.3 4.4  HGB 12.0* 11.8*  HCT 40.7 40.3  PLT 137* 127*    BMET Recent Labs    03/05/20 1813 03/06/20 0257  NA 139 140  K 5.0 4.4  CL 104 104  CO2 24 26  GLUCOSE 84 68*  BUN 34* 35*  CREATININE 1.55* 1.52*  CALCIUM 8.8* 8.7*   PT/INR No results for input(s): LABPROT, INR in the last 72 hours. ABG Recent Labs    03/06/20 0411 03/07/20 1120  PHART 7.281* 7.418  HCO3 28.5* 33.0*    Studies/Results: No results found.   Assessment and Plan: Multifocal bladder cancer s/p TURBT.   1. Management of hypoxia/hypercarbia per Hospitalist. 2. Patient to likely be transferred to floor tomorrow 3. PT to evaluate      LOS: 2 days    Nicolette Bang 03/07/2020 [phone] ID: [name], male   DOB: 06-Feb-1922, 86 y.o.   MRN: [id number]

## 2020-03-07 NOTE — Evaluation (Addendum)
Clinical/Bedside Swallow Evaluation Patient Details  Name: Scott [name] MRN: [id number] Date of Birth: 17-Nov-1921  Today's Date: 03/07/2020 Time: SLP Start Time (ACUTE ONLY): 0945 SLP Stop Time (ACUTE ONLY): 1013 SLP Time Calculation (min) (ACUTE ONLY): 28 min  Past Medical History:  Past Medical History:  Diagnosis Date  . Arthritis    "all over"  . Atrial fibrillation ([place])   . Benign localized prostatic hyperplasia with lower urinary tract symptoms (LUTS)   . Bradycardia 11/28/2015   Severe-HR in the 20s to 30s following intubation for urological procedure.  Marland Kitchen CAD- non obstructive disease by cath 3/14 cardiologist-  dr hochrein  . Chronic lower back pain   . COPD (chronic obstructive pulmonary disease) ([place])   . DDD (degenerative disc disease)   . Depression   . Dyspnea    w/ exertion and lying down (raise head)  . ETOH abuse   . GERD (gastroesophageal reflux disease)   . Glaucoma   . Glaucoma, both eyes   . History of bladder cancer urologist-- dr Jeffie Pollock   first dx 2012--  s/p TURBT's  . History of DVT of lower extremity 03/2013   bilateral   . History of pulmonary embolus (PE) 04/2013  . HTN (hypertension)   . Hyperlipidemia   . Hypertension   . LBBB (left bundle branch block)    chronic  . Non-ischemic cardiomyopathy ([place])    last echo 01-18-2017, ef 35-40%  . NSVT (nonsustained ventricular tachycardia) ([place])    a. NSVT 06/2012; NSVT also seen during 03/2013 adm. b. Med rx. Not candidate for ICD given adv age.  Marland Kitchen PAD (peripheral artery disease) ([place])   . 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 PVCs (premature ventricular contractions)   . PVD (peripheral vascular disease) ([facility])    Right ABI .75, Left .78 (2006)  . Syncope 06/2012   a. Felt to be postural syncope related to diuretics 06/2012.  Marland Kitchen Systolic and diastolic CHF, chronic ([facility]CC) cardiologsit-  dr hochrein   a. NICM  - patent cors 06/2012, EF 40% at that time. b. 03/2013 eval: EF 20-25%.  . Vision loss, left eye    due to glaucoma  . Wears glasses    Past Surgical History:  Past Surgical History:  Procedure Laterality Date  . AMPUTATION Left 10/16/2014   Procedure: AMPUTATION ABOVE KNEE- LEFT;  Surgeon: Mal Misty, MD;  Location: [facility];  Service: Vascular;  Laterality: Left;  . CARDIAC CATHETERIZATION  05-11-2004  DR [facility]   MINIMAL CORANARY PLAQUE/ NORMAL LVF/ EF 55%/  NON-OBSTRUCTIVE LAD 25%  . CARDIAC CATHETERIZATION  06/18/2012   dr hochrein   mild luminal irregularities of coronaries, ef 45-50%  . CARDIOVASCULAR STRESS TEST  10-15-2010   LOW RISK NUCLEAR STUDY/ NO EVIDENCE OF ISCHEMIA/ NORMAL EF  . CATARACT EXTRACTION W/ INTRAOCULAR LENS  IMPLANT, BILATERAL Bilateral   . CYSTOSCOPY  10/07/2011   Procedure: CYSTOSCOPY;  Surgeon: Malka So, MD;  Location: [facility];  Service: Urology;  Laterality: N/A;  . CYSTOSCOPY N/A 10/20/2018   Procedure: CYSTOSCOPY WITH FULGURATION OF PROSTATIC URETHRAL TUMOR;  Surgeon: [name], MD;  Location: [facility];  Service: Urology;  Laterality: N/A;  . CYSTOSCOPY N/A 10/11/2019   Procedure: CYSTOSCOPY;  Surgeon: Cleon Gustin, MD;  Location: [facility];  Service: Urology;  Laterality: N/A;  . CYSTOSCOPY W/ RETROGRADES Bilateral 03/04/2020   Procedure: CYSTOSCOPY WITH BILATERAL  RETROGRADE;  Surgeon: [name], MD;  Location: [facility];  Service: Urology;  Laterality: Bilateral;  . CYSTOSCOPY WITH BIOPSY  03/14/2012   Procedure: CYSTOSCOPY WITH BIOPSY;  Surgeon: Malka So, MD;  Location: [facility];  Service: Urology;  Laterality: N/A;  WITH FULGURATION  . CYSTOSCOPY WITH BIOPSY N/A 12/09/2016   Procedure: CYSTOSCOPY WITH BIOPSY AND FULGURATION;  Surgeon: [name], MD;  Location[facility];  Service: Urology;  Laterality: N/A;  . CYSTOSCOPY WITH BIOPSY N/A 12/20/2017   Procedure: CYSTOSCOPY WITH BIOPSY WITH FULGURATION;  Surgeon: [name], MD;   [facility];  Service: Urology;  Laterality: N/A;  . CYSTOSCOPY WITH BIOPSY N/A 06/09/2018   Procedure: CYSTOSCOPY WITH BIOPSY AND FULGURATION;  Surgeon: [name], MD;  Location: [facility];  Service: Urology;  Laterality: N/A;  . CYSTOSCOPY WITH FULGERATION N/A 01/20/2016   Procedure: CYSTOSCOPY, BIOPSY WITH FULGERATION OF URTHRAL TUMOR;  Surgeon: [name], MD;  Location[facility];  Service: Urology;  Laterality: N/A;  . CYSTOSCOPY WITH FULGERATION N/A 06/01/2016   Procedure: CYSTOSCOPY WITH FULGERATION urethral tumors and bladder neck;  Surgeon: [name], MD;  Location[facility];  Service: Urology;  Laterality: N/A;  . SHOULDER SURGERY Left 1970's  . TONSILLECTOMY    . TRANSTHORACIC ECHOCARDIOGRAM  01-18-2017   dr hochrein   moderate LVH, ef 35-40%, diffuse hypokinesis/  trivial AR and MR/  mild TR  . TRANSURETHRAL RESECTION OF BLADDER TUMOR  10/07/2011   Procedure: TRANSURETHRAL RESECTION OF BLADDER TUMOR (TURBT);  Surgeon: Malka So, MD;  Location: [facility];  Service: Urology;  Laterality: N/A;  . TRANSURETHRAL RESECTION OF BLADDER TUMOR N/A 07/10/2015   Procedure: TRANSURETHRAL RESECTION OF BLADDER TUMOR (TURBT), CYSTOSCOPY ;  Surgeon: [name], MD;  [facility];  Service: Urology;  Laterality: N/A;  . TRANSURETHRAL RESECTION OF BLADDER TUMOR N/A 10/11/2019   Procedure: TRANSURETHRAL RESECTION OF PROSTATIC URETHRAL TUMOR;  Surgeon: Cleon Gustin, MD;  Location[facility];  Service: Urology;  Laterality: N/A;  . TRANSURETHRAL RESECTION OF BLADDER TUMOR WITH MITOMYCIN-C N/A 03/04/2020   Procedure: TRANSURETHRAL RESECTION OF BLADDER TUMOR WITH GEMCITABINE;  Surgeon: [name], MD;  Location[facility];  Service: Urology;  Laterality: N/A;  . TRANSURETHRAL RESECTION OF PROSTATE  03/14/2012   Procedure: TRANSURETHRAL RESECTION OF THE PROSTATE WITH GYRUS INSTRUMENTS;  Surgeon: Malka So, MD;  Location[facility];  Service: Urology;  Laterality: N/A;   HPI:  Per MD  note "86 year old male with history of chronic systolic CHF with EF of 70%, hypertension, history of A. fib/DVT/PE on chronic Coumadin, COPD, dementia, history of peripheral vascular disease status post left AKA, with history of bladder cancer currently admitted under urology service status post TURBT 11/23 was scheduled for discharge 11/24 when he was found to be more lethargic. An ABG showed hypercarbic respiratory failure with respiratory acidosis and he was placed on BiPAP and transferred to stepdown".  Swallow evaluation ordered presumed to lethargy and respiratory issues.  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     Assessment / Plan / Recommendation Clinical Impression  Patient currently presents with a cognitive based dysphagia concerning for decreased oral transiting/mastication abilties. Inconsistent clinical presentation of oral holding (vs pharyngeal pooling) observed.  As minimal po continued, pt became too sleepy to safely consume intake.  Cough with thin noted with pt self feeding but sleepy and cough also observed with cracker bolus.  Pt masticated a pill and  thus medications were crushed and provided with applesauce by RN.  Recommend to continue this administration.  Recommend dys2/thin diet with strict precautions including having pt self feed with assist. Suspect pt's significant dysphagia will be acute only and he will resolve back to functional level.  Will follow. SLP Visit Diagnosis: Dysphagia, unspecified (R13.10)    Aspiration Risk  Moderate aspiration risk    Diet Recommendation Dysphagia 2 (Fine chop);Thin liquid   Liquid Administration via: Straw;Cup Medication Administration: Crushed with puree Compensations: Slow rate;Small sips/bites;Other (Comment) (check for oral pocketing), use puree to clear oral particulates as needed  Postural Changes: Remain upright for at least 30 minutes after po intake;Seated upright at 90 degrees     Other  Recommendations Oral Care Recommendations: Oral care BID   Follow up Recommendations    TBD    Frequency and Duration min 1 x/week  2 weeks       Prognosis Prognosis for Safe Diet Advancement: Fair Barriers to Reach Goals: Cognitive deficits      Swallow Study   General Date of Onset: 03/07/20 HPI: Per MD note "86 year old male with history of chronic systolic CHF with EF of 66%, hypertension, history of A. fib/DVT/PE on chronic Coumadin, COPD, dementia, history of peripheral vascular disease status post left AKA, with history of bladder cancer currently admitted under urology service status post TURBT 11/23 was scheduled for discharge 11/24 when he was found to be more lethargic. An ABG showed hypercarbic respiratory failure with respiratory acidosis and he was placed on BiPAP and transferred to stepdown".  Swallow evaluation ordered presumed to lethargy and respiratory issues. Type of Study: Bedside Swallow Evaluation Diet Prior to this Study: Dysphagia 3 (soft);Thin liquids Respiratory Status: Nasal cannula History of Recent Intubation: No Behavior/Cognition: Alert;Cooperative;Doesn't follow directions Oral Cavity Assessment: Dry Oral Care Completed by SLP: No Oral Cavity - Dentition: Other (Comment);Poor condition (some dentition present) Vision: Impaired for self-feeding (needs assist) Self-Feeding Abilities: Needs assist Patient Positioning: Upright in bed Baseline Vocal Quality: Normal Volitional Cough: Weak (appears marginally weak)    Oral/Motor/Sensory Function Overall Oral Motor/Sensory Function: Other (comment) (pt head tilted left and appears wiht increased fullness on left side of face but strength is bilateral, suspect preferred position for comfort, he did not straighten his head even with cues/assist)   Ice Chips Ice chips: Not tested   Thin Liquid Thin Liquid: Impaired Presentation: Cup;Self Fed;Spoon;Straw Oral Phase Functional Implications: Oral  holding Pharyngeal  Phase Impairments: Cough - Immediate Other Comments: inconsistent oral holding, cough with thin noted toward end of session as pt appeared to become fatigued    Nectar Thick Nectar Thick Liquid: Not tested   Honey Thick Honey Thick Liquid: Not tested   Puree Puree: Within functional limits Presentation: Spoon   Solid     Solid: Impaired Oral Phase Functional Implications: Prolonged oral transit;Impaired mastication (clinically appeared with prolonged mastication of cracker bolus) Pharyngeal Phase Impairments: Cough - Delayed      Macario Golds 03/07/2020,10:33 AM Kathleen Lime, MS [place] Office [phone] Pager [phone]

## 2020-03-07 NOTE — Progress Notes (Signed)
Pt has been combative throughout the night.  Pt refuses to keep his BiPAP mask or nasal cannula on and has pulled out his IVs.

## 2020-03-07 NOTE — Progress Notes (Addendum)
PROGRESS NOTE  Scott [name] DOB: December 18, 1921 DOA: 03/04/2020 PCP: Dettinger, Fransisca Kaufmann, MD   LOS: 2 days   Brief Narrative / Interim history: 86 year old male with history of chronic systolic CHF with EF of 23%, hypertension, history of A. fib/DVT/PE on chronic Coumadin, COPD, dementia, history of peripheral vascular disease status post left AKA, with history of bladder cancer currently admitted under urology service status post TURBT 11/23 was scheduled for discharge 11/24 when he was found to be more lethargic. An ABG showed hypercarbic respiratory failure with respiratory acidosis and he was placed on BiPAP and transferred to stepdown  Subjective / 24h Interval events: More alert this morning, niece is at bedside   Assessment & Plan: Principal Problem Acute metabolic encephalopathy/lethargy due to acute hypoxic and hypercarbic respiratory failure with respiratory acidosis -ABG improved, pH now normalized.  Slightly hypercarbic but suspect that is his baseline given normal pH -mobilize with PT -if he has a good day today and mobilizes with PT anticipate home 1-2 days  Active Problems Chronic kidney disease stage IIIa -Baseline 1.2-1.3, around 1.5 and stable.   Acute on chronic systolic CHF -Appears euvolemic, transition back to oral Lasix  History of A. fib/DVT/PE -Resume Coumadin per primary team  History of COPD -With CO2 retention possibly perioperative, continue bipap  Underlying dementia -Usually oriented to person and place, son tells me he is remarkably independent and able to do his ADLs  Status post TURBT 11/23 -Per primary  Scheduled Meds: . carvedilol  6.25 mg Oral BID  . chlorhexidine  15 mL Mouth Rinse BID  . Chlorhexidine Gluconate Cloth  6 each Topical Daily  . donepezil  10 mg Oral QHS  . febuxostat  40 mg Oral Daily  . furosemide  20 mg Intravenous Daily  . mouth rinse  15 mL Mouth Rinse BID  . mirabegron ER  25 mg Oral Daily  .  pantoprazole  40 mg Oral Daily  . sertraline  50 mg Oral Daily  . simvastatin  10 mg Oral QPM   Continuous Infusions: PRN Meds:.acetaminophen, albuterol, bisacodyl, fluticasone, HYDROcodone-acetaminophen, HYDROmorphone (DILAUDID) injection, ondansetron, oxybutynin, senna-docusate, sodium phosphate  Diet Orders (From admission, onward)    Start     Ordered   03/07/20 1015  DIET DYS 2 Room service appropriate? No; Fluid consistency: Thin  Diet effective now       Comments: Extra gravies/sauces  Question Answer Comment  Room service appropriate? No   Fluid consistency: Thin      03/07/20 1015          DVT prophylaxis: SCDs Start: 03/04/20 1216     Code Status: DNR  Family Communication: Called son, no answer, left message  Status is: Inpatient  Remains inpatient appropriate because:Inpatient level of care appropriate due to severity of illness   Dispo: The patient is from: Home              Anticipated d/c is to: Home              Anticipated d/c date is: 3 days              Patient currently is not medically stable to d/c.  Procedures:  TURBT 11/23  Microbiology  None   Antimicrobials: None     Objective: Vitals:   03/07/20 0828 03/07/20 0900 03/07/20 1000 03/07/20 1100  BP:  132/69 (!) 147/85 (!) 144/67  Pulse:  73    Resp:  18  (!) 24  Temp: 97.9  F (36.6 C)     TempSrc: Axillary     SpO2:  99% 99% 99%  Weight:      Height:        Intake/Output Summary (Last 24 hours) at 03/07/2020 1208 Last data filed at 03/07/2020 1100 Gross per 24 hour  Intake 480 ml  Output 1675 ml  Net -1195 ml   Filed Weights   03/04/20 0854  Weight: 59.4 kg    Examination:  Constitutional: No distress, alert Eyes: No icterus ENMT: Moist mucous membranes Neck: normal, supple Respiratory: Clear bilaterally, no wheezing or crackles, normal respiratory effort Cardiovascular: Regular rate and rhythm, no murmurs, trace edema Abdomen: Nondistended, nontender, bowel  sounds positive Musculoskeletal: no clubbing / cyanosis.  Skin: No rashes seen Neurologic: No focal deficits  Data Reviewed: I have independently reviewed following labs and imaging studies   CBC: Recent Labs  Lab 03/05/20 1813 03/06/20 0257  WBC 5.3 4.4  HGB 12.0* 11.8*  HCT 40.7 40.3  MCV 92.1 92.2  PLT 137* 412*   Basic Metabolic Panel: Recent Labs  Lab 03/05/20 1813 03/06/20 0257  NA 139 140  K 5.0 4.4  CL 104 104  CO2 24 26  GLUCOSE 84 68*  BUN 34* 35*  CREATININE 1.55* 1.52*  CALCIUM 8.8* 8.7*  MG  --  2.1   Liver Function Tests: Recent Labs  Lab 03/05/20 1813 03/06/20 0257  AST 18 17  ALT 13 12  ALKPHOS 139* 131*  BILITOT 0.9 1.0  PROT 6.6 6.5  ALBUMIN 3.8 3.7   Coagulation Profile: No results for input(s): INR, PROTIME in the last 168 hours. HbA1C: No results for input(s): HGBA1C in the last 72 hours. CBG: Recent Labs  Lab 03/06/20 1727 03/06/20 1950 03/07/20 0010 03/07/20 0744 03/07/20 1155  GLUCAP 93 114* 98 87 105*    Recent Results (from the past 240 hour(s))  SARS CORONAVIRUS 2 (TAT 6-24 HRS) Nasopharyngeal Nasopharyngeal Swab     Status: None   Collection Time: 02/29/20  1:10 PM   Specimen: Nasopharyngeal Swab  Result Value Ref Range Status   SARS Coronavirus 2 NEGATIVE NEGATIVE Final    Comment: (NOTE) SARS-CoV-2 target nucleic acids are NOT DETECTED.  The SARS-CoV-2 RNA is generally detectable in upper and lower respiratory specimens during the acute phase of infection. Negative results do not preclude SARS-CoV-2 infection, do not rule out co-infections with other pathogens, and should not be used as the sole basis for treatment or other patient management decisions. Negative results must be combined with clinical observations, patient history, and epidemiological information. The expected result is Negative.  Fact Sheet for Patients: SugarRoll.be  Fact Sheet for Healthcare Providers:  https://www.woods-mathews.com/  This test is not yet approved or cleared by the [place] FDA and  has been authorized for detection and/or diagnosis of SARS-CoV-2 by FDA under an Emergency Use Authorization (EUA). This EUA will remain  in effect (meaning this test can be used) for the duration of the COVID-19 declaration under Se ction 564(b)(1) of the Act, 21 U.S.C. section 360bbb-3(b)(1), unless the authorization is terminated or revoked sooner.  Performed at [facility], [place] [address], [place], [place] [address]   MRSA PCR Screening     Status: None   Collection Time: 03/05/20  7:18 PM   Specimen: Nasal Mucosa; Nasopharyngeal  Result Value Ref Range Status   MRSA by PCR NEGATIVE NEGATIVE Final    Comment:        The GeneXpert MRSA Assay (  FDA approved for NASAL specimens only), is one component of a comprehensive MRSA colonization surveillance program. It is not intended to diagnose MRSA infection nor to guide or monitor treatment for MRSA infections. Performed at [facility], [place] [address], [place], [place] [address]      Radiology Studies: No results found.  Marzetta Board, MD, PhD [facility]  Between 7 am - 7 pm I am available, please contact me via Amion or Securechat  Between 7 pm - 7 am I am not available, please contact night coverage MD/APP via Amion

## 2020-03-08 DIAGNOSIS — E872 Acidosis: Secondary | ICD-10-CM | POA: Diagnosis not present

## 2020-03-08 DIAGNOSIS — C679 Malignant neoplasm of bladder, unspecified: Secondary | ICD-10-CM | POA: Diagnosis not present

## 2020-03-08 LAB — GLUCOSE, CAPILLARY
Glucose-Capillary: 101 mg/dL — ABNORMAL HIGH (ref 70–99)
Glucose-Capillary: 110 mg/dL — ABNORMAL HIGH (ref 70–99)
Glucose-Capillary: 120 mg/dL — ABNORMAL HIGH (ref 70–99)
Glucose-Capillary: 83 mg/dL (ref 70–99)
Glucose-Capillary: 90 mg/dL (ref 70–99)
Glucose-Capillary: 94 mg/dL (ref 70–99)

## 2020-03-08 MED ORDER — PANTOPRAZOLE SODIUM 40 MG PO PACK
40.0000 mg | PACK | Freq: Every day | ORAL | Status: DC
  Administered 2020-03-08 – 2020-03-09 (×2): 40 mg via ORAL
  Filled 2020-03-08 (×2): qty 20

## 2020-03-08 NOTE — Progress Notes (Signed)
Pt unable to tolerate Bipap. Pt with no apparent SOB or respiratory distress. Will continue to monitor.

## 2020-03-08 NOTE — Progress Notes (Signed)
Assumed care of the patient from ICU RN. Condition stable no changes in initial am assessment at this time. PT resting comfortably in the bed. Vital stable and at baseline for the patient. Cont with plan of care

## 2020-03-08 NOTE — Progress Notes (Signed)
4 Days Post-Op Subjective: No acute events overnight.  Could not tolerate BiPAP last night, but saturating well with nasal cannula.  Resting comfortably in bed.  Urinating without difficulty following catheter removal.  Objective: Vital signs in last 24 hours: Temp:  [97.5 F (36.4 C)-98.4 F (36.9 C)] 97.5 F (36.4 C) (11/27 0839) Pulse Rate:  [33-73] 33 (11/27 1000) Resp:  [14-25] 20 (11/27 1000) BP: (110-187)/(50-114) 110/68 (11/27 1000) SpO2:  [93 %-100 %] 100 % (11/27 1000)  Intake/Output from previous day: 11/26 0701 - 11/27 0700 In: 240 [P.O.:240] Out: 2200 [Urine:2200]  Intake/Output this shift: Total I/O In: 240 [P.O.:240] Out: -   Physical Exam:  General: Alert, but disoriented CV: palpable distal pulses Lungs: equal chest rise Abdomen: Soft, NTND, no rebound or guarding   Lab Results: Recent Labs    03/05/20 1813 03/06/20 0257  HGB 12.0* 11.8*  HCT 40.7 40.3   BMET Recent Labs    03/05/20 1813 03/06/20 0257  NA 139 140  K 5.0 4.4  CL 104 104  CO2 24 26  GLUCOSE 84 68*  BUN 34* 35*  CREATININE 1.55* 1.52*  CALCIUM 8.8* 8.7*     Studies/Results: No results found.  Assessment/Plan: Postop day 3 status post TURBT due to multifocal bladder cancer.  Admitted for postoperative hypoxia  -Tolerating nasal cannula well--wean as tolerated.  Okay to transfer to floor.  Appreciate TRH assistance. -Case management consulted for home health   LOS: 3 days   Ellison Hughs, MD [facility] Pager: [phone]  03/08/2020, 10:51 AM

## 2020-03-08 NOTE — Progress Notes (Signed)
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 An ABG showed hypercarbic respiratory failure with respiratory acidosis and he was placed on BiPAP and transferred to stepdown  Subjective / 24h Interval events: Pleasantly confused this morning.  No complaints.  Assessment & Plan: Principal Problem Acute metabolic encephalopathy/lethargy due to acute hypoxic and hypercarbic respiratory failure with respiratory acidosis -ABG improved, pH now normalized.  Slightly hypercarbic but suspect that is his baseline given normal pH -mobilize with PT -Doing well, okay for home on Sunday unless something else comes up  Active Problems Chronic kidney disease stage IIIa -Baseline 1.2-1.3, around 1.5 and stable.   Acute on chronic systolic CHF -Appears euvolemic  History of A. fib/DVT/PE -Resume anticoagulation per primary team  History of COPD -With CO2 retention possibly perioperative, now stable, no wheezing  Underlying dementia -Usually oriented to person and place, son tells me he is remarkably independent and able to do his ADLs  Status post TURBT 11/23 -Per primary  Scheduled Meds: . carvedilol  6.25 mg Oral BID  . chlorhexidine  15 mL Mouth Rinse BID  . Chlorhexidine Gluconate Cloth  6 each Topical Daily  . donepezil  10 mg Oral QHS  . febuxostat  40 mg Oral Daily  . furosemide  20 mg Oral Daily  . mouth rinse  15 mL Mouth Rinse BID  . mirabegron ER  25 mg Oral Daily  . pantoprazole sodium  40 mg Oral  Daily  . sertraline  50 mg Oral Daily  . simvastatin  10 mg Oral QPM   Continuous Infusions: PRN Meds:.acetaminophen, albuterol, bisacodyl, fluticasone, hydrALAZINE, HYDROcodone-acetaminophen, HYDROmorphone (DILAUDID) injection, ondansetron, oxybutynin, senna-docusate, sodium phosphate  Diet Orders (From admission, onward)    Start     Ordered   03/07/20 1015  DIET DYS 2 Room service appropriate? No; Fluid consistency: Thin  Diet effective now       Comments: Extra gravies/sauces  Question Answer Comment  Room service appropriate? No   Fluid consistency: Thin      11 /26/21 1015          DVT prophylaxis: SCDs Start: 03/04/20 1216     Code Status: DNR  Family Communication: Discussed with son over the phone  Status is: Inpatient  Remains inpatient appropriate because:Inpatient level of care appropriate due to severity of illness   Dispo: The patient is from: Home              Anticipated d/c is to: Home              Anticipated d/c date is: 3 days              Patient currently is not medically stable to d/c.  Procedures:  TURBT 11/23  Microbiology  None   Antimicrobials: None     Objective: Vitals:   03/08/20 0800 03/08/20 0839 03/08/20 0900 03/08/20 1000  BP: (!) 151/104  (!) 141/52 110/68  Pulse:    (!) 33  Resp: 14  (!) 25 20  Temp:  (!) 97.5 F (36.4 C)  TempSrc:  Axillary    SpO2: 97%  93% 100%  Weight:      Height:        Intake/Output Summary (Last 24 hours) at 03/08/2020 1114 Last data filed at 03/08/2020 0900 Gross per 24 hour  Intake 480 ml  Output 1600 ml  Net -1120 ml   Filed Weights   03/04/20 0854  Weight: 59.4 kg    Examination:  Constitutional: NAD, alert Eyes: No scleral icterus ENMT: Moist external drains Neck: normal, supple Respiratory: Lungs are clear, no wheezing or crackles, normal respiratory effort Cardiovascular: Regular rate and rhythm, no murmurs, trace edema Abdomen: Soft, nontender, nondistended, bowel  sounds positive Musculoskeletal: no clubbing / cyanosis.  Skin: No rashes appreciated Neurologic: Nonfocal, equal strength  Data Reviewed: I have independently reviewed following labs and imaging studies   CBC: Recent Labs  Lab 03/05/20 1813 03/06/20 0257  WBC 5.3 4.4  HGB 12.0* 11.8*  HCT 40.7 40.3  MCV 92.1 92.2  PLT 137* 810*   Basic Metabolic Panel: Recent Labs  Lab 03/05/20 1813 03/06/20 0257  NA 139 140  K 5.0 4.4  CL 104 104  CO2 24 26  GLUCOSE 84 68*  BUN 34* 35*  CREATININE 1.55* 1.52*  CALCIUM 8.8* 8.7*  MG  --  2.1   Liver Function Tests: Recent Labs  Lab 03/05/20 1813 03/06/20 0257  AST 18 17  ALT 13 12  ALKPHOS 139* 131*  BILITOT 0.9 1.0  PROT 6.6 6.5  ALBUMIN 3.8 3.7   Coagulation Profile: No results for input(s): INR, PROTIME in the last 168 hours. HbA1C: No results for input(s): HGBA1C in the last 72 hours. CBG: Recent Labs  Lab 03/07/20 1549 03/07/20 2034 03/08/20 0026 03/08/20 0355 03/08/20 0808  GLUCAP 84 93 90 83 94    Recent Results (from the past 240 hour(s))  SARS CORONAVIRUS 2 (TAT 6-24 HRS) Nasopharyngeal Nasopharyngeal Swab     Status: None   Collection Time: 02/29/20  1:10 PM   Specimen: Nasopharyngeal Swab  Result Value Ref Range Status   SARS Coronavirus 2 NEGATIVE NEGATIVE Final    Comment: (NOTE) SARS-CoV-2 target nucleic acids are NOT DETECTED.  The SARS-CoV-2 RNA is generally detectable in upper and lower respiratory specimens during the acute phase of infection. Negative results do not preclude SARS-CoV-2 infection, do not rule out co-infections with other pathogens, and should not be used as the sole basis for treatment or other patient management decisions. Negative results must be combined with clinical observations, patient history, and epidemiological information. The expected result is Negative.  Fact Sheet for Patients: SugarRoll.be  Fact Sheet for Healthcare  Providers: https://www.woods-mathews.com/  This test is not yet approved or cleared by the [place] FDA and  has been authorized for detection and/or diagnosis of SARS-CoV-2 by FDA under an Emergency Use Authorization (EUA). This EUA will remain  in effect (meaning this test can be used) for the duration of the COVID-19 declaration under Se ction 564(b)(1) of the Act, 21 U.S.C. section 360bbb-3(b)(1), unless the authorization is terminated or revoked sooner.  Performed at [facility], [place] [address], [place], [place] [address]   MRSA PCR Screening     Status: None   Collection Time: 03/05/20  7:18 PM   Specimen: Nasal Mucosa; Nasopharyngeal  Result Value Ref Range Status   MRSA by PCR NEGATIVE NEGATIVE Final    Comment:        The GeneXpert MRSA Assay (FDA approved for NASAL specimens  only), is one component of a comprehensive MRSA colonization surveillance program. It is not intended to diagnose MRSA infection nor to guide or monitor treatment for MRSA infections. Performed at [facility], [place] [address], [place], [place] [address]      Radiology Studies: No results found.  Marzetta Board, MD, PhD [facility]  Between 7 am - 7 pm I am available, please contact me via Amion or Securechat  Between 7 pm - 7 am I am not available, please contact night coverage MD/APP via Amion

## 2020-03-08 NOTE — Progress Notes (Signed)
Physical Therapy Treatment Patient Details Name: Scott [name] MRN: [id number] DOB: [birth year] Today's Date: 03/08/2020    History of Present Illness Pt admitted with recurrent urothelial CA and now s/p Large bladder tumor resection 03/04/20.  Pt with hx of Bil eye glaucoma, PVD, Non-ischemic cardiomyopathy, LBBB, DDD, Etoh abuse, CAD, a-fib and L AKA (2016)    PT Comments    Pt cooperative but with increased lethargy and decreased sitting balance this am.  Pt up to side of bed with assist and requiring min guard to min assist to maintain sitting balance.  Pt encouraged to attempt transfer to sit up in recliner but stated "you can sit there, I want to go back to bed".  Follow Up Recommendations  Home health PT     Equipment Recommendations  None recommended by PT    Recommendations for Other Services       Precautions / Restrictions Precautions Precautions: Fall Restrictions Weight Bearing Restrictions: No    Mobility  Bed Mobility Overal bed mobility: Needs Assistance Bed Mobility: Supine to Sit;Sit to Supine     Supine to sit: Mod assist Sit to supine: Mod assist   General bed mobility comments: assist to manage R LE and control trunk; bed pad utilized to assist pt to rotate to/from EOB  Transfers                 General transfer comment: Pt with increased lethargy and increased difficulty balancing at bedside vs yesterday.  With encouragement to attempt up to chair, pt states "you can sit there, I want to go back to bed"  Ambulation/Gait                 Stairs             Wheelchair Mobility    Modified Rankin (Stroke Patients Only)       Balance Overall balance assessment: Needs assistance Sitting-balance support: Single extremity supported Sitting balance-Leahy Scale: Fair                                      Cognition Arousal/Alertness: Awake/alert Behavior During Therapy: Flat affect Overall Cognitive Status:  History of cognitive impairments - at baseline                                        Exercises      General Comments        Pertinent Vitals/Pain Pain Assessment: No/denies pain    Home Living                      Prior Function            PT Goals (current goals can now be found in the care plan section) Acute Rehab PT Goals Patient Stated Goal: HOME PT Goal Formulation: With patient Time For Goal Achievement: 03/21/20 Potential to Achieve Goals: Fair Progress towards PT goals: Progressing toward goals    Frequency    Min 3X/week      PT Plan Current plan remains appropriate    Co-evaluation              AM-PAC PT "6 Clicks" Mobility   Outcome Measure  Help needed turning from your back to your side while in a flat bed without using bedrails?:  A Little Help needed moving from lying on your back to sitting on the side of a flat bed without using bedrails?: A Lot Help needed moving to and from a bed to a chair (including a wheelchair)?: A Lot Help needed standing up from a chair using your arms (e.g., wheelchair or bedside chair)?: A Lot Help needed to walk in hospital room?: Total Help needed climbing 3-5 steps with a railing? : Total 6 Click Score: 11    End of Session Equipment Utilized During Treatment: Gait belt Activity Tolerance: Patient tolerated treatment well Patient left: in bed;with call bell/phone within reach;with bed alarm set;with family/visitor present Nurse Communication: Mobility status PT Visit Diagnosis: Difficulty in walking, not elsewhere classified (R26.2);Muscle weakness (generalized) (M62.81)     Time: 1027-2536 PT Time Calculation (min) (ACUTE ONLY): 21 min  Charges:  $Therapeutic Activity: 8-22 mins                     Debe Coder PT Acute Rehabilitation Services Pager [phone] Office [phone]    Marceline Napierala 03/08/2020, 12:23 PM

## 2020-03-09 DIAGNOSIS — E872 Acidosis: Secondary | ICD-10-CM | POA: Diagnosis not present

## 2020-03-09 DIAGNOSIS — C679 Malignant neoplasm of bladder, unspecified: Secondary | ICD-10-CM | POA: Diagnosis not present

## 2020-03-09 NOTE — TOC Progression Note (Signed)
Transition of Care [facility]) - Progression Note    Patient Details  Name: Scott [name] MRN: [id number] Date of Birth: 25-Oct-1921  [facility] CM/SW Contact  Joaquin Courts, RN Phone Number: 03/09/2020, 11:37 AM  Clinical Narrative:   CM spoke with patient's son who reports patient is active with [facility] services.  Agency rep notified, agency to provide HHPT and RN services.      Expected Discharge Plan: [place] Barriers to Discharge: No Barriers Identified  Expected Discharge Plan and Services Expected Discharge Plan: [place]   Discharge Planning Services: CM Consult Post Acute Care Choice: [place] arrangements for the past 2 months: Single Family Home Expected Discharge Date: 03/09/20                         HH Arranged: PT, RN [place] Agency: [place] ([place]) Date [place]: 03/09/20 Time [place]: 1137 Representative spoke with at [place]: [place] ([place]) Interventions    Readmission Risk Interventions Readmission Risk Prevention Plan 12/23/2019 07/09/2019 07/02/2019  Transportation Screening Complete Complete Complete  PCP or Specialist Appt within 3-5 Days - - -  HRI or [place] or Home Care Consult comments - - -  Social Work Scientific laboratory technician for [place] Planning/Counseling - - -  [place] - - -  Medication Review Press photographer) Complete Complete Complete  PCP or Specialist appointment within 3-5 days of discharge Complete Complete Not Complete  HRI or Home Care Consult Complete Complete Complete  HRI or Home Care Consult Pt Refusal Comments - - -  SW Recovery Care/Counseling Consult Complete Complete Complete  Palliative Care Screening Not Applicable Complete Not Applicable  Skilled Nursing Facility Not Applicable Complete Not Complete  SNF Comments - - -  Some recent data  might be hidden

## 2020-03-09 NOTE — Progress Notes (Signed)
Discharged via wc accompanied by NT and son. Son given dc instructions with verbalized understanding. No scripts at dc. Assessment unchanged.

## 2020-03-09 NOTE — Discharge Summary (Signed)
Date of admission: 03/04/2020  Date of discharge: 03/09/2020  Admission diagnosis: UCC of the bladder  Discharge diagnosis: UCC of the bladder  Secondary diagnoses: Post-op hypoxia (resolved)  History and Physical: For full details, please see admission history and physical. Briefly, Scott [name] is a 86 y.o. year old patient with a history of CHF, HTN, A. fib/DVT/PE on Eliquis, COPD, dementia, history of peripheral vascular disease status post left AKA, with history of bladder cancer.  He is status post TURBT on 03/04/2020 with Dr. Jeffie Pollock and developed postoperative hypoxia, which prompted observation in the hospital.  Hospital Course: Following surgery, the patient was observed in the intensive care unit with various forms of supplemental oxygen.  His hypoxia progressively improved throughout his hospitalization and by 03/09/2020, the patient had normal O2 saturations with room air.  Physical Exam:  General: Alert and oriented Lungs: CTAB, equal chest rise  Laboratory values: No results for input(s): HGB, HCT in the last 72 hours. No results for input(s): CREATININE in the last 72 hours.  Disposition: Home with son.  Home health will be arranged  Discharge instruction: The patient was instructed to be ambulatory but told to refrain from heavy lifting, strenuous activity, or driving.  Discharge medications:  Allergies as of 03/09/2020      Reactions   Rocephin [ceftriaxone Sodium In Dextrose] Other (See Comments)   Recurrent seizures   Lipitor [atorvastatin] Hives, Rash      Medication List    TAKE these medications   acetaminophen 500 MG tablet Commonly known as: TYLENOL Take 1,000 mg by mouth every 6 (six) hours as needed for moderate pain or headache.   albuterol (2.5 MG/3ML) 0.083% nebulizer solution Commonly known as: PROVENTIL Take 2.5 mg by nebulization every 6 (six) hours as needed for wheezing or shortness of breath.   albuterol 108 (90 Base) MCG/ACT  inhaler Commonly known as: VENTOLIN HFA Inhale 2 puffs into the lungs every 6 (six) hours as needed for wheezing or shortness of breath.   apixaban 2.5 MG Tabs tablet Commonly known as: Eliquis Take 1 tablet (2.5 mg total) by mouth 2 (two) times daily.   carvedilol 6.25 MG tablet Commonly known as: COREG TAKE  (1)  TABLET TWICE A DAY. What changed: See the new instructions.   Combivent Respimat 20-100 MCG/ACT Aers respimat Generic drug: Ipratropium-Albuterol Inhale 1 puff into the lungs every 6 (six) hours as needed for wheezing or shortness of breath (As needed for wheezing , cough or shortness of breath). What changed:   how much to take  when to take this   donepezil 10 MG tablet Commonly known as: ARICEPT TAKE 1 TABLET AT BEDTIME   febuxostat 40 MG tablet Commonly known as: ULORIC TAKE 1 TABLET DAILY   fluticasone 50 MCG/ACT nasal spray Commonly known as: FLONASE SPRAY 1 SPRAY IN EACH NOSTRIL ONCE DAILY. What changed: See the new instructions.   furosemide 20 MG tablet Commonly known as: LASIX TAKE (1) TABLET DAILY IN THE MORNING. What changed: See the new instructions.   gabapentin 400 MG capsule Commonly known as: NEURONTIN Take 1 capsule (400 mg total) by mouth 2 (two) times daily.   Myrbetriq 25 MG Tb24 tablet Generic drug: mirabegron ER TAKE 1 TABLET DAILY AS DIRECTED What changed:   how much to take  additional instructions   pantoprazole 40 MG tablet Commonly known as: PROTONIX TAKE 1 TABLET DAILY   sertraline 50 MG tablet Commonly known as: ZOLOFT TAKE (1) TABLET DAILY IN THE MORNING.  What changed: See the new instructions.   simvastatin 10 MG tablet Commonly known as: ZOCOR TAKE 1 TABLET ONCE DAILY IN THE EVENING What changed: See the new instructions.       Followup:   Follow-up Information    [name], MD On 03/14/2020.   Specialty: Urology Why: Zoila Shutter information: [place]  [phone]

## 2020-03-09 NOTE — Plan of Care (Signed)
  Problem: Health Behavior/Discharge Planning: Goal: Ability to manage health-related needs will improve Outcome: Progressing   Problem: Clinical Measurements: Goal: Will remain free from infection Outcome: Progressing Goal: Respiratory complications will improve Outcome: Progressing   Problem: Nutrition: Goal: Adequate nutrition will be maintained Outcome: Progressing   Problem: Elimination: Goal: Will not experience complications related to urinary retention Outcome: Progressing   Problem: Pain Managment: Goal: General experience of comfort will improve Outcome: Progressing   Problem: Safety: Goal: Ability to remain free from injury will improve Outcome: Progressing   Problem: Skin Integrity: Goal: Risk for impaired skin integrity will decrease Outcome: Progressing   Problem: Skin Integrity: Goal: Demonstration of wound healing without infection will improve Outcome: Progressing   Problem: Urinary Elimination: Goal: Ability to avoid or minimize complications of infection will improve Outcome: Progressing

## 2020-03-09 NOTE — Progress Notes (Signed)
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 An ABG showed hypercarbic respiratory failure with respiratory acidosis and he was placed on BiPAP and transferred to stepdown  Subjective / 24h Interval events: Pleasantly confused. Eating breakfast   Assessment & Plan: Principal Problem Acute metabolic encephalopathy/lethargy due to acute hypoxic and hypercarbic respiratory failure with respiratory acidosis -ABG improved, pH now normalized.  Slightly hypercarbic but suspect that is his baseline given normal pH -mobilize with PT -Doing well this morning, ate all his breakfast. OK for dc from medical standpoint  Active Problems Chronic kidney disease stage IIIa -Baseline 1.2-1.3, around 1.5 and stable.   Acute on chronic systolic CHF -Appears euvolemic  History of A. fib/DVT/PE -Resume anticoagulation per primary team  History of COPD -With CO2 retention possibly perioperative, now stable, no wheezing  Underlying dementia -Usually oriented to person and place, son tells me he is remarkably independent and able to do his ADLs  Status post TURBT 11/23 -Per primary  Scheduled Meds: . carvedilol  6.25 mg Oral BID  . chlorhexidine  15 mL Mouth Rinse BID  . Chlorhexidine Gluconate Cloth  6 each Topical Daily  . donepezil  10 mg Oral QHS  . febuxostat  40 mg Oral Daily  . furosemide  20 mg Oral Daily  . mouth rinse  15 mL Mouth Rinse BID  . mirabegron ER  25 mg Oral Daily  . pantoprazole sodium  40 mg  Oral Daily  . sertraline  50 mg Oral Daily  . simvastatin  10 mg Oral QPM   Continuous Infusions: PRN Meds:.acetaminophen, albuterol, bisacodyl, fluticasone, hydrALAZINE, HYDROcodone-acetaminophen, HYDROmorphone (DILAUDID) injection, ondansetron, oxybutynin, senna-docusate, sodium phosphate  Diet Orders (From admission, onward)    Start     Ordered   03/07/20 1015  DIET DYS 2 Room service appropriate? No; Fluid consistency: Thin  Diet effective now       Comments: Extra gravies/sauces  Question Answer Comment  Room service appropriate? No   Fluid consistency: Thin      03/07/20 1015          DVT prophylaxis: SCDs Start: 03/04/20 1216     Code Status: DNR  Family Communication: no family at bedside   Status is: Inpatient  Remains inpatient appropriate because:Inpatient level of care appropriate due to severity of illness   Dispo: The patient is from: Home              Anticipated d/c is to: Home              Anticipated d/c date is: 3 days              Patient currently is not medically stable to d/c.  Procedures:  TURBT 11/23  Microbiology  None   Antimicrobials: None     Objective: Vitals:   03/08/20 1238 03/08/20 1640 03/08/20 2014 03/09/20 0247  BP: 134/63 118/60 (!) 152/84   Pulse: 67 66 64 64  Resp: 13 14 18  (!) 21  Temp: 98.5 F (36.9 C) (!) 97.5 F (36.4 C)  98.4 F (36.9 C)   TempSrc: Oral Oral Oral   SpO2: 96% 93% 93% 96%  Weight:      Height:        Intake/Output Summary (Last 24 hours) at 03/09/2020 6045 Last data filed at 03/09/2020 0856 Gross per 24 hour  Intake 820 ml  Output 250 ml  Net 570 ml   Filed Weights   03/04/20 0854  Weight: 59.4 kg    Examination:  Constitutional: NAD Eyes: no icterus  ENMT: mmm Neck: normal, supple Respiratory: CTA biL, no wheezing  Cardiovascular: rrr, no mrg, no edema Abdomen: soft, nt, nd, bs+ Musculoskeletal: no clubbing / cyanosis.  Skin: no rashes Neurologic: non focal   Data  Reviewed: I have independently reviewed following labs and imaging studies   CBC: Recent Labs  Lab 03/05/20 1813 03/06/20 0257  WBC 5.3 4.4  HGB 12.0* 11.8*  HCT 40.7 40.3  MCV 92.1 92.2  PLT 137* 409*   Basic Metabolic Panel: Recent Labs  Lab 03/05/20 1813 03/06/20 0257  NA 139 140  K 5.0 4.4  CL 104 104  CO2 24 26  GLUCOSE 84 68*  BUN 34* 35*  CREATININE 1.55* 1.52*  CALCIUM 8.8* 8.7*  MG  --  2.1   Liver Function Tests: Recent Labs  Lab 03/05/20 1813 03/06/20 0257  AST 18 17  ALT 13 12  ALKPHOS 139* 131*  BILITOT 0.9 1.0  PROT 6.6 6.5  ALBUMIN 3.8 3.7   Coagulation Profile: No results for input(s): INR, PROTIME in the last 168 hours. HbA1C: No results for input(s): HGBA1C in the last 72 hours. CBG: Recent Labs  Lab 03/08/20 0355 03/08/20 0808 03/08/20 1302 03/08/20 1637 03/08/20 2013  GLUCAP 83 94 101* 110* 120*    Recent Results (from the past 240 hour(s))  SARS CORONAVIRUS 2 (TAT 6-24 HRS) Nasopharyngeal Nasopharyngeal Swab     Status: None   Collection Time: 02/29/20  1:10 PM   Specimen: Nasopharyngeal Swab  Result Value Ref Range Status   SARS Coronavirus 2 NEGATIVE NEGATIVE Final    Comment: (NOTE) SARS-CoV-2 target nucleic acids are NOT DETECTED.  The SARS-CoV-2 RNA is generally detectable in upper and lower respiratory specimens during the acute phase of infection. Negative results do not preclude SARS-CoV-2 infection, do not rule out co-infections with other pathogens, and should not be used as the sole basis for treatment or other patient management decisions. Negative results must be combined with clinical observations, patient history, and epidemiological information. The expected result is Negative.  Fact Sheet for Patients: SugarRoll.be  Fact Sheet for Healthcare Providers: https://www.woods-mathews.com/  This test is not yet approved or cleared by the [place] FDA and  has  been authorized for detection and/or diagnosis of SARS-CoV-2 by FDA under an Emergency Use Authorization (EUA). This EUA will remain  in effect (meaning this test can be used) for the duration of the COVID-19 declaration under Se ction 564(b)(1) of the Act, 21 U.S.C. section 360bbb-3(b)(1), unless the authorization is terminated or revoked sooner.  Performed at [facility], [place] [address], [place],  [address]   MRSA PCR Screening     Status: None   Collection Time: 03/05/20  7:18 PM   Specimen: Nasal Mucosa; Nasopharyngeal  Result Value Ref Range Status   MRSA by PCR NEGATIVE NEGATIVE Final    Comment:        The GeneXpert MRSA Assay (FDA approved for NASAL specimens only), is one component of a comprehensive MRSA  colonization surveillance program. It is not intended to diagnose MRSA infection nor to guide or monitor treatment for MRSA infections. Performed at [facility], [place] [address] [place], [place] [address]      Radiology Studies: No results found.  Marzetta Board, MD, PhD [facility]  Between 7 am - 7 pm I am available, please contact me via Amion or Securechat  Between 7 pm - 7 am I am not available, please contact night coverage MD/APP via Amion

## 2020-03-10 ENCOUNTER — Ambulatory Visit (INDEPENDENT_AMBULATORY_CARE_PROVIDER_SITE_OTHER): Payer: Medicare Other

## 2020-03-10 ENCOUNTER — Other Ambulatory Visit: Payer: Self-pay

## 2020-03-10 DIAGNOSIS — M1990 Unspecified osteoarthritis, unspecified site: Secondary | ICD-10-CM | POA: Diagnosis not present

## 2020-03-10 DIAGNOSIS — Z86718 Personal history of other venous thrombosis and embolism: Secondary | ICD-10-CM

## 2020-03-10 DIAGNOSIS — J449 Chronic obstructive pulmonary disease, unspecified: Secondary | ICD-10-CM | POA: Diagnosis not present

## 2020-03-10 DIAGNOSIS — Z7901 Long term (current) use of anticoagulants: Secondary | ICD-10-CM

## 2020-03-10 DIAGNOSIS — Z7951 Long term (current) use of inhaled steroids: Secondary | ICD-10-CM

## 2020-03-10 DIAGNOSIS — I5021 Acute systolic (congestive) heart failure: Secondary | ICD-10-CM | POA: Diagnosis not present

## 2020-03-10 DIAGNOSIS — E785 Hyperlipidemia, unspecified: Secondary | ICD-10-CM | POA: Diagnosis not present

## 2020-03-10 DIAGNOSIS — I739 Peripheral vascular disease, unspecified: Secondary | ICD-10-CM

## 2020-03-10 DIAGNOSIS — R319 Hematuria, unspecified: Secondary | ICD-10-CM

## 2020-03-10 DIAGNOSIS — F32A Depression, unspecified: Secondary | ICD-10-CM

## 2020-03-10 DIAGNOSIS — Z9981 Dependence on supplemental oxygen: Secondary | ICD-10-CM

## 2020-03-10 DIAGNOSIS — J9621 Acute and chronic respiratory failure with hypoxia: Secondary | ICD-10-CM

## 2020-03-10 DIAGNOSIS — I5042 Chronic combined systolic (congestive) and diastolic (congestive) heart failure: Secondary | ICD-10-CM | POA: Diagnosis not present

## 2020-03-10 DIAGNOSIS — Z9181 History of falling: Secondary | ICD-10-CM

## 2020-03-10 DIAGNOSIS — Z483 Aftercare following surgery for neoplasm: Secondary | ICD-10-CM | POA: Diagnosis not present

## 2020-03-10 DIAGNOSIS — C679 Malignant neoplasm of bladder, unspecified: Secondary | ICD-10-CM | POA: Diagnosis not present

## 2020-03-10 DIAGNOSIS — I130 Hypertensive heart and chronic kidney disease with heart failure and stage 1 through stage 4 chronic kidney disease, or unspecified chronic kidney disease: Secondary | ICD-10-CM | POA: Diagnosis not present

## 2020-03-10 DIAGNOSIS — K219 Gastro-esophageal reflux disease without esophagitis: Secondary | ICD-10-CM

## 2020-03-10 DIAGNOSIS — Z89612 Acquired absence of left leg above knee: Secondary | ICD-10-CM

## 2020-03-10 DIAGNOSIS — I429 Cardiomyopathy, unspecified: Secondary | ICD-10-CM | POA: Diagnosis not present

## 2020-03-10 DIAGNOSIS — I4891 Unspecified atrial fibrillation: Secondary | ICD-10-CM | POA: Diagnosis not present

## 2020-03-10 DIAGNOSIS — N1831 Chronic kidney disease, stage 3a: Secondary | ICD-10-CM

## 2020-03-10 DIAGNOSIS — Z79899 Other long term (current) drug therapy: Secondary | ICD-10-CM

## 2020-03-10 DIAGNOSIS — N400 Enlarged prostate without lower urinary tract symptoms: Secondary | ICD-10-CM

## 2020-03-10 DIAGNOSIS — F0390 Unspecified dementia without behavioral disturbance: Secondary | ICD-10-CM

## 2020-03-10 DIAGNOSIS — Z8551 Personal history of malignant neoplasm of bladder: Secondary | ICD-10-CM

## 2020-03-10 DIAGNOSIS — Z791 Long term (current) use of non-steroidal anti-inflammatories (NSAID): Secondary | ICD-10-CM

## 2020-03-11 DIAGNOSIS — N1831 Chronic kidney disease, stage 3a: Secondary | ICD-10-CM | POA: Diagnosis not present

## 2020-03-11 DIAGNOSIS — I130 Hypertensive heart and chronic kidney disease with heart failure and stage 1 through stage 4 chronic kidney disease, or unspecified chronic kidney disease: Secondary | ICD-10-CM | POA: Diagnosis not present

## 2020-03-11 DIAGNOSIS — I5042 Chronic combined systolic (congestive) and diastolic (congestive) heart failure: Secondary | ICD-10-CM | POA: Diagnosis not present

## 2020-03-11 DIAGNOSIS — Z483 Aftercare following surgery for neoplasm: Secondary | ICD-10-CM | POA: Diagnosis not present

## 2020-03-11 DIAGNOSIS — C679 Malignant neoplasm of bladder, unspecified: Secondary | ICD-10-CM | POA: Diagnosis not present

## 2020-03-11 DIAGNOSIS — I5021 Acute systolic (congestive) heart failure: Secondary | ICD-10-CM | POA: Diagnosis not present

## 2020-03-11 DIAGNOSIS — J9621 Acute and chronic respiratory failure with hypoxia: Secondary | ICD-10-CM | POA: Diagnosis not present

## 2020-03-11 DIAGNOSIS — J449 Chronic obstructive pulmonary disease, unspecified: Secondary | ICD-10-CM | POA: Diagnosis not present

## 2020-03-12 DIAGNOSIS — C679 Malignant neoplasm of bladder, unspecified: Secondary | ICD-10-CM | POA: Diagnosis not present

## 2020-03-12 DIAGNOSIS — I5021 Acute systolic (congestive) heart failure: Secondary | ICD-10-CM | POA: Diagnosis not present

## 2020-03-12 DIAGNOSIS — I5042 Chronic combined systolic (congestive) and diastolic (congestive) heart failure: Secondary | ICD-10-CM | POA: Diagnosis not present

## 2020-03-12 DIAGNOSIS — J9621 Acute and chronic respiratory failure with hypoxia: Secondary | ICD-10-CM | POA: Diagnosis not present

## 2020-03-12 DIAGNOSIS — N1831 Chronic kidney disease, stage 3a: Secondary | ICD-10-CM | POA: Diagnosis not present

## 2020-03-12 DIAGNOSIS — J449 Chronic obstructive pulmonary disease, unspecified: Secondary | ICD-10-CM | POA: Diagnosis not present

## 2020-03-12 DIAGNOSIS — I130 Hypertensive heart and chronic kidney disease with heart failure and stage 1 through stage 4 chronic kidney disease, or unspecified chronic kidney disease: Secondary | ICD-10-CM | POA: Diagnosis not present

## 2020-03-12 DIAGNOSIS — Z483 Aftercare following surgery for neoplasm: Secondary | ICD-10-CM | POA: Diagnosis not present

## 2020-03-13 DIAGNOSIS — B372 Candidiasis of skin and nail: Secondary | ICD-10-CM | POA: Diagnosis not present

## 2020-03-13 DIAGNOSIS — B029 Zoster without complications: Secondary | ICD-10-CM | POA: Diagnosis not present

## 2020-03-14 ENCOUNTER — Ambulatory Visit: Payer: BC Managed Care – PPO | Admitting: Urology

## 2020-03-17 ENCOUNTER — Telehealth: Payer: Self-pay

## 2020-03-17 ENCOUNTER — Other Ambulatory Visit: Payer: Self-pay | Admitting: Family Medicine

## 2020-03-17 ENCOUNTER — Ambulatory Visit: Payer: Medicare Other | Admitting: *Deleted

## 2020-03-17 ENCOUNTER — Other Ambulatory Visit: Payer: Self-pay | Admitting: Urology

## 2020-03-17 DIAGNOSIS — N401 Enlarged prostate with lower urinary tract symptoms: Secondary | ICD-10-CM

## 2020-03-17 DIAGNOSIS — I130 Hypertensive heart and chronic kidney disease with heart failure and stage 1 through stage 4 chronic kidney disease, or unspecified chronic kidney disease: Secondary | ICD-10-CM | POA: Diagnosis not present

## 2020-03-17 DIAGNOSIS — I5021 Acute systolic (congestive) heart failure: Secondary | ICD-10-CM | POA: Diagnosis not present

## 2020-03-17 DIAGNOSIS — Z483 Aftercare following surgery for neoplasm: Secondary | ICD-10-CM | POA: Diagnosis not present

## 2020-03-17 DIAGNOSIS — C679 Malignant neoplasm of bladder, unspecified: Secondary | ICD-10-CM | POA: Diagnosis not present

## 2020-03-17 DIAGNOSIS — N138 Other obstructive and reflux uropathy: Secondary | ICD-10-CM

## 2020-03-17 DIAGNOSIS — I5042 Chronic combined systolic (congestive) and diastolic (congestive) heart failure: Secondary | ICD-10-CM | POA: Diagnosis not present

## 2020-03-17 DIAGNOSIS — J9621 Acute and chronic respiratory failure with hypoxia: Secondary | ICD-10-CM | POA: Diagnosis not present

## 2020-03-17 DIAGNOSIS — N1831 Chronic kidney disease, stage 3a: Secondary | ICD-10-CM | POA: Diagnosis not present

## 2020-03-17 DIAGNOSIS — J449 Chronic obstructive pulmonary disease, unspecified: Secondary | ICD-10-CM | POA: Diagnosis not present

## 2020-03-17 NOTE — Telephone Encounter (Signed)
appt made 03/19/20, son aware

## 2020-03-17 NOTE — Telephone Encounter (Signed)
LMTCB

## 2020-03-17 NOTE — Chronic Care Management (AMB) (Addendum)
  Care Management   Note  03/17/2020 Name: Scott [name] MRN: [id number] DOB: 1921/12/02  Red EMMI flag received on 03/14/20 from automated telephone outreach program s/p hospital discharge. Multiple attempts to reach patient at home were unsuccessful. He has seen urologist since discharge and is scheduled for a follow-up on 03/28/20.     Follow up plan: Urologist appointment 03/28/20  Chong Sicilian, BSN, RN-BC [place] [facility] [place] [facility] Direct Dial: [phone]

## 2020-03-19 ENCOUNTER — Encounter: Payer: Self-pay | Admitting: Family Medicine

## 2020-03-19 ENCOUNTER — Ambulatory Visit (INDEPENDENT_AMBULATORY_CARE_PROVIDER_SITE_OTHER): Payer: Medicare Other | Admitting: Family Medicine

## 2020-03-19 ENCOUNTER — Other Ambulatory Visit: Payer: Self-pay

## 2020-03-19 VITALS — BP 137/71 | HR 69 | Ht 66.0 in

## 2020-03-19 DIAGNOSIS — R21 Rash and other nonspecific skin eruption: Secondary | ICD-10-CM | POA: Diagnosis not present

## 2020-03-19 NOTE — Progress Notes (Signed)
BP 137/71   Pulse 69   Ht 5\' 6"  (1.676 m)   SpO2 96%   BMI 21.14 kg/m    Subjective:   Patient ID: Scott [name], male    DOB: Jun 27, 1921, 86 y.o.   MRN: [id number]  HPI: Scott [name] is a 86 y.o. male presenting on 03/19/2020 for back itching (itching and some bumps. Both shoulders and back)   HPI Patient has complaints of back itching and some bumps they noticed and went to the urgent care a few days ago.  He said it went away almost is immediately by the next day it was gone they were given some prednisone and some creams and some acyclovir and has not had any issues with any rashes or itching since that time.  Patient denies any fevers or chills or redness or warmth or any rash currently.  The rash was on his back on the upper side of both sides.  Relevant past medical, surgical, family and social history reviewed and updated as indicated. Interim medical history since our last visit reviewed. Allergies and medications reviewed and updated.  Review of Systems  Constitutional: Negative for chills and fever.  Respiratory: Negative for shortness of breath and wheezing.   Cardiovascular: Negative for chest pain and leg swelling.  Skin: Positive for rash.  All other systems reviewed and are negative.   Per HPI unless specifically indicated above   Allergies as of 03/19/2020      Reactions   Rocephin [ceftriaxone Sodium In Dextrose] Other (See Comments)   Recurrent seizures   Lipitor [atorvastatin] Hives, Rash      Medication List       Accurate as of March 19, 2020  5:13 PM. If you have any questions, ask your nurse or doctor.        acetaminophen 500 MG tablet Commonly known as: TYLENOL Take 1,000 mg by mouth every 6 (six) hours as needed for moderate pain or headache.   acyclovir 400 MG tablet Commonly known as: ZOVIRAX Take 400 mg by mouth 5 (five) times daily.   albuterol (2.5 MG/3ML) 0.083% nebulizer solution Commonly known as: PROVENTIL Take 2.5 mg by  nebulization every 6 (six) hours as needed for wheezing or shortness of breath.   albuterol 108 (90 Base) MCG/ACT inhaler Commonly known as: VENTOLIN HFA Inhale 2 puffs into the lungs every 6 (six) hours as needed for wheezing or shortness of breath.   apixaban 2.5 MG Tabs tablet Commonly known as: Eliquis Take 1 tablet (2.5 mg total) by mouth 2 (two) times daily.   carvedilol 6.25 MG tablet Commonly known as: COREG TAKE  (1)  TABLET TWICE A DAY. What changed: See the new instructions.   Combivent Respimat 20-100 MCG/ACT Aers respimat Generic drug: Ipratropium-Albuterol Inhale 1 puff into the lungs every 6 (six) hours as needed for wheezing or shortness of breath (As needed for wheezing , cough or shortness of breath). What changed:   how much to take  when to take this   donepezil 10 MG tablet Commonly known as: ARICEPT TAKE 1 TABLET AT BEDTIME   febuxostat 40 MG tablet Commonly known as: ULORIC TAKE 1 TABLET DAILY   fluticasone 50 MCG/ACT nasal spray Commonly known as: FLONASE SPRAY 1 SPRAY IN EACH NOSTRIL ONCE DAILY. What changed: See the new instructions.   furosemide 20 MG tablet Commonly known as: LASIX Take 1 tablet (20 mg total) by mouth daily.   gabapentin 400 MG capsule Commonly known as: NEURONTIN  Take 1 capsule (400 mg total) by mouth 2 (two) times daily.   Myrbetriq 25 MG Tb24 tablet Generic drug: mirabegron ER Take 1 tablet (25 mg total) by mouth daily.   nystatin powder Commonly known as: MYCOSTATIN/NYSTOP Apply 1 application topically 3 (three) times daily.   pantoprazole 40 MG tablet Commonly known as: PROTONIX TAKE 1 TABLET DAILY   predniSONE 10 MG tablet Commonly known as: DELTASONE Take 10 mg by mouth as directed. Take 4 po qd x 2d then 3 po qd x 2d then 2 po qd x 2d then 1 po qd x 2d then stop   sertraline 50 MG tablet Commonly known as: ZOLOFT TAKE (1) TABLET DAILY IN THE MORNING.   simvastatin 10 MG tablet Commonly known as:  ZOCOR TAKE 1 TABLET ONCE DAILY IN THE EVENING What changed: See the new instructions.        Objective:   BP 137/71   Pulse 69   Ht 5\' 6"  (1.676 m)   SpO2 96%   BMI 21.14 kg/m   Wt Readings from Last 3 Encounters:  03/04/20 131 lb (59.4 kg)  02/26/20 131 lb (59.4 kg)  02/19/20 131 lb (59.4 kg)    Physical Exam Vitals and nursing note reviewed.  Constitutional:      General: He is not in acute distress.    Appearance: He is well-developed. He is not diaphoretic.  Eyes:     General: No scleral icterus.    Conjunctiva/sclera: Conjunctivae normal.  Neck:     Thyroid: No thyromegaly.  Skin:    General: Skin is warm and dry.     Findings: No rash (No rash on exam today).  Neurological:     Mental Status: He is alert.       Assessment & Plan:   Problem List Items Addressed This Visit    None    Visit Diagnoses    Rash    -  Primary    No rash today but did have a rash but is resolved now,  Follow up plan: Return if symptoms worsen or fail to improve.  Counseling provided for all of the vaccine components No orders of the defined types were placed in this encounter.   Caryl Pina, MD [facility] 03/19/2020, 5:13 PM

## 2020-03-20 ENCOUNTER — Other Ambulatory Visit: Payer: Self-pay | Admitting: Family Medicine

## 2020-03-21 DIAGNOSIS — I130 Hypertensive heart and chronic kidney disease with heart failure and stage 1 through stage 4 chronic kidney disease, or unspecified chronic kidney disease: Secondary | ICD-10-CM | POA: Diagnosis not present

## 2020-03-21 DIAGNOSIS — C679 Malignant neoplasm of bladder, unspecified: Secondary | ICD-10-CM | POA: Diagnosis not present

## 2020-03-21 DIAGNOSIS — I5042 Chronic combined systolic (congestive) and diastolic (congestive) heart failure: Secondary | ICD-10-CM | POA: Diagnosis not present

## 2020-03-21 DIAGNOSIS — J9621 Acute and chronic respiratory failure with hypoxia: Secondary | ICD-10-CM | POA: Diagnosis not present

## 2020-03-21 DIAGNOSIS — J449 Chronic obstructive pulmonary disease, unspecified: Secondary | ICD-10-CM | POA: Diagnosis not present

## 2020-03-21 DIAGNOSIS — I5021 Acute systolic (congestive) heart failure: Secondary | ICD-10-CM | POA: Diagnosis not present

## 2020-03-21 DIAGNOSIS — N1831 Chronic kidney disease, stage 3a: Secondary | ICD-10-CM | POA: Diagnosis not present

## 2020-03-21 DIAGNOSIS — Z483 Aftercare following surgery for neoplasm: Secondary | ICD-10-CM | POA: Diagnosis not present

## 2020-03-23 DIAGNOSIS — F1011 Alcohol abuse, in remission: Secondary | ICD-10-CM | POA: Diagnosis not present

## 2020-03-23 DIAGNOSIS — K219 Gastro-esophageal reflux disease without esophagitis: Secondary | ICD-10-CM | POA: Diagnosis not present

## 2020-03-23 DIAGNOSIS — N1831 Chronic kidney disease, stage 3a: Secondary | ICD-10-CM | POA: Diagnosis not present

## 2020-03-23 DIAGNOSIS — E785 Hyperlipidemia, unspecified: Secondary | ICD-10-CM | POA: Diagnosis not present

## 2020-03-23 DIAGNOSIS — Z7951 Long term (current) use of inhaled steroids: Secondary | ICD-10-CM | POA: Diagnosis not present

## 2020-03-23 DIAGNOSIS — Z8551 Personal history of malignant neoplasm of bladder: Secondary | ICD-10-CM | POA: Diagnosis not present

## 2020-03-23 DIAGNOSIS — N400 Enlarged prostate without lower urinary tract symptoms: Secondary | ICD-10-CM | POA: Diagnosis not present

## 2020-03-23 DIAGNOSIS — M519 Unspecified thoracic, thoracolumbar and lumbosacral intervertebral disc disorder: Secondary | ICD-10-CM | POA: Diagnosis not present

## 2020-03-23 DIAGNOSIS — J9621 Acute and chronic respiratory failure with hypoxia: Secondary | ICD-10-CM | POA: Diagnosis not present

## 2020-03-23 DIAGNOSIS — C679 Malignant neoplasm of bladder, unspecified: Secondary | ICD-10-CM | POA: Diagnosis not present

## 2020-03-23 DIAGNOSIS — R319 Hematuria, unspecified: Secondary | ICD-10-CM | POA: Diagnosis not present

## 2020-03-23 DIAGNOSIS — I5021 Acute systolic (congestive) heart failure: Secondary | ICD-10-CM | POA: Diagnosis not present

## 2020-03-23 DIAGNOSIS — Z9981 Dependence on supplemental oxygen: Secondary | ICD-10-CM | POA: Diagnosis not present

## 2020-03-23 DIAGNOSIS — Z79899 Other long term (current) drug therapy: Secondary | ICD-10-CM | POA: Diagnosis not present

## 2020-03-23 DIAGNOSIS — Z791 Long term (current) use of non-steroidal anti-inflammatories (NSAID): Secondary | ICD-10-CM | POA: Diagnosis not present

## 2020-03-23 DIAGNOSIS — J9611 Chronic respiratory failure with hypoxia: Secondary | ICD-10-CM | POA: Diagnosis not present

## 2020-03-23 DIAGNOSIS — I739 Peripheral vascular disease, unspecified: Secondary | ICD-10-CM | POA: Diagnosis not present

## 2020-03-23 DIAGNOSIS — Z9181 History of falling: Secondary | ICD-10-CM | POA: Diagnosis not present

## 2020-03-23 DIAGNOSIS — M1990 Unspecified osteoarthritis, unspecified site: Secondary | ICD-10-CM | POA: Diagnosis not present

## 2020-03-23 DIAGNOSIS — F32A Depression, unspecified: Secondary | ICD-10-CM | POA: Diagnosis not present

## 2020-03-23 DIAGNOSIS — J449 Chronic obstructive pulmonary disease, unspecified: Secondary | ICD-10-CM | POA: Diagnosis not present

## 2020-03-23 DIAGNOSIS — Z483 Aftercare following surgery for neoplasm: Secondary | ICD-10-CM | POA: Diagnosis not present

## 2020-03-23 DIAGNOSIS — Z89612 Acquired absence of left leg above knee: Secondary | ICD-10-CM | POA: Diagnosis not present

## 2020-03-23 DIAGNOSIS — H409 Unspecified glaucoma: Secondary | ICD-10-CM | POA: Diagnosis not present

## 2020-03-23 DIAGNOSIS — I130 Hypertensive heart and chronic kidney disease with heart failure and stage 1 through stage 4 chronic kidney disease, or unspecified chronic kidney disease: Secondary | ICD-10-CM | POA: Diagnosis not present

## 2020-03-23 DIAGNOSIS — I5042 Chronic combined systolic (congestive) and diastolic (congestive) heart failure: Secondary | ICD-10-CM | POA: Diagnosis not present

## 2020-03-23 DIAGNOSIS — Z7901 Long term (current) use of anticoagulants: Secondary | ICD-10-CM | POA: Diagnosis not present

## 2020-03-23 DIAGNOSIS — R3915 Urgency of urination: Secondary | ICD-10-CM | POA: Diagnosis not present

## 2020-03-23 DIAGNOSIS — I429 Cardiomyopathy, unspecified: Secondary | ICD-10-CM | POA: Diagnosis not present

## 2020-03-23 DIAGNOSIS — F0390 Unspecified dementia without behavioral disturbance: Secondary | ICD-10-CM | POA: Diagnosis not present

## 2020-03-23 DIAGNOSIS — I447 Left bundle-branch block, unspecified: Secondary | ICD-10-CM | POA: Diagnosis not present

## 2020-03-23 DIAGNOSIS — I4891 Unspecified atrial fibrillation: Secondary | ICD-10-CM | POA: Diagnosis not present

## 2020-03-23 DIAGNOSIS — Z86718 Personal history of other venous thrombosis and embolism: Secondary | ICD-10-CM | POA: Diagnosis not present

## 2020-03-24 DIAGNOSIS — I5042 Chronic combined systolic (congestive) and diastolic (congestive) heart failure: Secondary | ICD-10-CM | POA: Diagnosis not present

## 2020-03-24 DIAGNOSIS — N1831 Chronic kidney disease, stage 3a: Secondary | ICD-10-CM | POA: Diagnosis not present

## 2020-03-24 DIAGNOSIS — Z483 Aftercare following surgery for neoplasm: Secondary | ICD-10-CM | POA: Diagnosis not present

## 2020-03-24 DIAGNOSIS — I130 Hypertensive heart and chronic kidney disease with heart failure and stage 1 through stage 4 chronic kidney disease, or unspecified chronic kidney disease: Secondary | ICD-10-CM | POA: Diagnosis not present

## 2020-03-24 DIAGNOSIS — I5021 Acute systolic (congestive) heart failure: Secondary | ICD-10-CM | POA: Diagnosis not present

## 2020-03-24 DIAGNOSIS — C679 Malignant neoplasm of bladder, unspecified: Secondary | ICD-10-CM | POA: Diagnosis not present

## 2020-03-25 DIAGNOSIS — I5021 Acute systolic (congestive) heart failure: Secondary | ICD-10-CM | POA: Diagnosis not present

## 2020-03-25 DIAGNOSIS — N1831 Chronic kidney disease, stage 3a: Secondary | ICD-10-CM | POA: Diagnosis not present

## 2020-03-25 DIAGNOSIS — I5042 Chronic combined systolic (congestive) and diastolic (congestive) heart failure: Secondary | ICD-10-CM | POA: Diagnosis not present

## 2020-03-25 DIAGNOSIS — Z483 Aftercare following surgery for neoplasm: Secondary | ICD-10-CM | POA: Diagnosis not present

## 2020-03-25 DIAGNOSIS — I130 Hypertensive heart and chronic kidney disease with heart failure and stage 1 through stage 4 chronic kidney disease, or unspecified chronic kidney disease: Secondary | ICD-10-CM | POA: Diagnosis not present

## 2020-03-25 DIAGNOSIS — C679 Malignant neoplasm of bladder, unspecified: Secondary | ICD-10-CM | POA: Diagnosis not present

## 2020-03-27 DIAGNOSIS — N1831 Chronic kidney disease, stage 3a: Secondary | ICD-10-CM | POA: Diagnosis not present

## 2020-03-27 DIAGNOSIS — I5021 Acute systolic (congestive) heart failure: Secondary | ICD-10-CM | POA: Diagnosis not present

## 2020-03-27 DIAGNOSIS — I5042 Chronic combined systolic (congestive) and diastolic (congestive) heart failure: Secondary | ICD-10-CM | POA: Diagnosis not present

## 2020-03-27 DIAGNOSIS — C679 Malignant neoplasm of bladder, unspecified: Secondary | ICD-10-CM | POA: Diagnosis not present

## 2020-03-27 DIAGNOSIS — Z483 Aftercare following surgery for neoplasm: Secondary | ICD-10-CM | POA: Diagnosis not present

## 2020-03-27 DIAGNOSIS — I130 Hypertensive heart and chronic kidney disease with heart failure and stage 1 through stage 4 chronic kidney disease, or unspecified chronic kidney disease: Secondary | ICD-10-CM | POA: Diagnosis not present

## 2020-03-28 ENCOUNTER — Other Ambulatory Visit: Payer: Self-pay

## 2020-03-28 ENCOUNTER — Ambulatory Visit (INDEPENDENT_AMBULATORY_CARE_PROVIDER_SITE_OTHER): Payer: Medicare Other | Admitting: Urology

## 2020-03-28 ENCOUNTER — Encounter: Payer: Self-pay | Admitting: Urology

## 2020-03-28 VITALS — BP 136/82 | HR 77 | Temp 98.9°F | Ht 66.0 in | Wt 131.0 lb

## 2020-03-28 DIAGNOSIS — C680 Malignant neoplasm of urethra: Secondary | ICD-10-CM | POA: Diagnosis not present

## 2020-03-28 DIAGNOSIS — C678 Malignant neoplasm of overlapping sites of bladder: Secondary | ICD-10-CM | POA: Diagnosis not present

## 2020-03-28 LAB — URINALYSIS, ROUTINE W REFLEX MICROSCOPIC
Bilirubin, UA: NEGATIVE
Glucose, UA: NEGATIVE
Ketones, UA: NEGATIVE
Nitrite, UA: NEGATIVE
Specific Gravity, UA: 1.015 (ref 1.005–1.030)
Urobilinogen, Ur: 1 mg/dL (ref 0.2–1.0)
pH, UA: 6 (ref 5.0–7.5)

## 2020-03-28 LAB — MICROSCOPIC EXAMINATION: Renal Epithel, UA: NONE SEEN /hpf

## 2020-03-28 NOTE — Progress Notes (Signed)
Urological Symptom Review  Patient is experiencing the following symptoms: Burning/pain with urination   Review of Systems  Gastrointestinal (upper)  : Negative for upper GI symptoms  Gastrointestinal (lower) : Negative for lower GI symptoms  Constitutional : Negative for symptoms  Skin: Negative for skin symptoms  Eyes: Negative for eye symptoms  Ear/Nose/Throat : Negative for Ear/Nose/Throat symptoms  Hematologic/Lymphatic: Negative for Hematologic/Lymphatic symptoms  Cardiovascular : Negative for cardiovascular symptoms  Respiratory : Negative for respiratory symptoms  Endocrine: Negative for endocrine symptoms  Musculoskeletal: Negative for musculoskeletal symptoms  Neurological: Negative for neurological symptoms  Psychologic: Negative for psychiatric symptoms 

## 2020-03-28 NOTE — Patient Instructions (Signed)
Bacillus Calmette-Guerin Live, BCG intravesical solution What is this medicine? BACILLUS CALMETTE-GUERIN LIVE, BCG (ba SIL [place] KAL met gay RAYN) is a bacteria solution. This medicine stimulates the immune system to ward off cancer cells. It is used to treat bladder cancer. This medicine may be used for other purposes; ask your health care provider or pharmacist if you have questions. COMMON BRAND NAME(S): Theracys, TICE BCG What should I tell my health care provider before I take this medicine? They need to know if you have any of these conditions:  aneurysm  blood in the urine  bladder biopsy within 2 weeks  fever or infection  immune system problems  leukemia  lymphoma  myasthenia gravis  need organ transplant  prosthetic device like arterial graft, artificial joint, prosthetic heart valve  recent or ongoing radiation therapy  tuberculosis  an unusual or allergic reaction to Bacillus Calmette-Guerin Live, BCG, latex, other medicines, foods, dyes, or preservatives  pregnant or trying to get pregnant  breast-feeding How should I use this medicine? This drug is given as a catheter infusion into the bladder. It is administered in a hospital or clinic by a specially trained health care provider. You will be given directions to follow before the treatment. Follow your health care provider's directions carefully. This medicine contains live bacteria. It is very important to follow these directions closely after treatment to prevent others from coming in contact with your urine. Your health care provider may give you additional directions to follow. Try to hold this medicine in your bladder for 1 to 2 hours. Follow these directions the first time you go to the bathroom and for 6 hours after the first void.  Wash your hands before using the restroom. After voiding, wash your hands and genital area.  Use a toilet and sit when going to the bathroom. This helps to prevent the urine  from splashing. Do not use public toilets or void outside.  After each void, add 2 cups of undiluted bleach to the toilet. Close the lid. Wait 15 to 20 minutes and then flush the toilet.  After the first void, drink more fluids to help dilute your urine.  If you have urinary incontinence, wash the clothes you were wearing in the washer immediately. Do not wash other clothes at the same time.  If you are wearing an incontinence pad, pour bleach on the pad and allow it to soak into the pad before throwing it away. Put the pad in a plastic bag and put it in the trash. Talk to your pediatrician regarding the use of this medicine in children. Special care may be needed. Overdosage: If you think you have taken too much of this medicine contact a poison control center or emergency room at once. NOTE: This medicine is only for you. Do not share this medicine with others. What if I miss a dose? It is important not to miss your dose. Call your doctor or health care professional if you are unable to keep an appointment. What may interact with this medicine?  antibiotics  medicines to suppress your immune system like chemotherapy agents or corticosteroids  medicine to treat tuberculosis This list may not describe all possible interactions. Give your health care provider a list of all the medicines, herbs, non-prescription drugs, or dietary supplements you use. Also tell them if you smoke, drink alcohol, or use illegal drugs. Some items may interact with your medicine. What should I watch for while using this medicine? Visit your health care  provider for checks on your progress. This medicine may make you feel generally unwell. Contact your health care provider if your symptoms last more than 2 days or if they get worse. Call your health care provider right away if you have a severe or unusual symptom. Infection can be spread to others through contact with this medicine. To prevent the spread of  infection, follow your health care provider's directions carefully after treatment. Do not become pregnant while taking this medicine. There is a potential for serious side effects to an unborn child. Talk to your health care provider for more information. Do not breast-feed an infant while taking this medicine. If you have sex while on this medicine, use a condom. Ask your health care provider how long you should use a condom. What side effects may I notice from receiving this medicine? Side effects that you should report to your doctor or health care professional as soon as possible:  allergic reactions like skin rash, itching or hives, swelling of the face, lips, or tongue  signs of infection - fever or chills, cough, sore throat, pain or difficulty passing urine  signs of decreased red blood cells - unusually weak or tired, fainting spells, lightheadedness  blood in urine  breathing problems  cough  eye pain, redness  flu-like symptoms  joint pain  bladder-area pain for more than 2 days after treatment  trouble passing urine or change in the amount of urine  vomiting  yellowing of the eyes or skin Side effects that usually do not require medical attention (report to your doctor or health care professional if they continue or are bothersome):  bladder spasm  burning when passing urine within 2 days of treatment  feel need to pass urine often or wake up at night to pass urine  loss of appetite This list may not describe all possible side effects. Call your doctor for medical advice about side effects. You may report side effects to FDA at 1-800-FDA-1088. Where should I keep my medicine? This drug is given in a hospital or clinic and will not be stored at home. NOTE: This sheet is a summary. It may not cover all possible information. If you have questions about this medicine, talk to your doctor, pharmacist, or health care provider.  2020 Elsevier/Gold Standard  (2018-06-02 12:05:29)

## 2020-03-28 NOTE — Progress Notes (Signed)
Subjective:  1. Malignant neoplasm of overlapping sites of bladder ([place])   2. Malignant neoplasm of urethra (HCC)     GU hx: Scott [name] returns today in f/u for his history of urothelial cancer.  He last had a TURBT of recurrent disease on 03/04/20 and was found to have multfocal HG NMIBC of the urethra, prostatic urethra and multiple bladder sites.  Retrograde pyelography was normal.  He had a resection in 7/21 and he had only low grade disease at this time.   He is doing well without significant complaints and his urine today ahs 6-10 WBC and 3-10 RBC's.    He has urgency and can have UUI.   He remains on Myrbetriq.  He has a history of UTI's but is not on treatment now.    He has no pain.      IPSS    Row Name 03/28/20 1500         International Prostate Symptom Score   How often have you had the sensation of not emptying your bladder? Less than 1 in 5     How often have you had to urinate less than every two hours? Less than half the time     How often have you found you stopped and started again several times when you urinated? Less than half the time     How often have you found it difficult to postpone urination? Less than half the time     How often have you had a weak urinary stream? About half the time     How often have you had to strain to start urination? Not at All     How many times did you typically get up at night to urinate? 2 Times     Total IPSS Score 12           Quality of Life due to urinary symptoms   If you were to spend the rest of your life with your urinary condition just the way it is now how would you feel about that? Mostly Satisfied             ROS:  ROS:  A complete review of systems was performed.  All systems are negative except for pertinent findings as noted.   ROS  Allergies  Allergen Reactions  . Atorvastatin Hives and Rash  . Rocephin [Ceftriaxone Sodium In Dextrose] Other (See Comments)    Recurrent seizures    Outpatient Encounter  Medications as of 03/28/2020  Medication Sig  . acetaminophen (TYLENOL) 500 MG tablet Take 1,000 mg by mouth every 6 (six) hours as needed for moderate pain or headache.  Marland Kitchen acyclovir (ZOVIRAX) 400 MG tablet Take 400 mg by mouth 5 (five) times daily.  Marland Kitchen albuterol (PROVENTIL) (2.5 MG/3ML) 0.083% nebulizer solution Take 2.5 mg by nebulization every 6 (six) hours as needed for wheezing or shortness of breath.  Marland Kitchen albuterol (VENTOLIN HFA) 108 (90 Base) MCG/ACT inhaler Inhale 2 puffs into the lungs every 6 (six) hours as needed for wheezing or shortness of breath.  Marland Kitchen apixaban (ELIQUIS) 2.5 MG TABS tablet Take 1 tablet (2.5 mg total) by mouth 2 (two) times daily.  . carvedilol (COREG) 6.25 MG tablet TAKE  (1)  TABLET TWICE A DAY. (Patient taking differently: Take 6.25 mg by mouth 2 (two) times daily.)  . donepezil (ARICEPT) 10 MG tablet TAKE 1 TABLET AT BEDTIME  . febuxostat (ULORIC) 40 MG tablet TAKE 1 TABLET DAILY  . fluticasone (FLONASE) 50  MCG/ACT nasal spray SPRAY 1 SPRAY IN EACH NOSTRIL ONCE DAILY. (Patient taking differently: Place 1 spray into both nostrils daily as needed for allergies or rhinitis.)  . furosemide (LASIX) 20 MG tablet Take 1 tablet (20 mg total) by mouth daily.  Marland Kitchen gabapentin (NEURONTIN) 400 MG capsule Take 1 capsule (400 mg total) by mouth 2 (two) times daily.  . Ipratropium-Albuterol (COMBIVENT RESPIMAT) 20-100 MCG/ACT AERS respimat Inhale 1 puff into the lungs every 6 (six) hours as needed for wheezing or shortness of breath (As needed for wheezing , cough or shortness of breath). (Patient taking differently: Inhale 2 puffs into the lungs daily.)  . MYRBETRIQ 25 MG TB24 tablet Take 1 tablet (25 mg total) by mouth daily.  Marland Kitchen nystatin (MYCOSTATIN/NYSTOP) powder Apply 1 application topically 3 (three) times daily.  . pantoprazole (PROTONIX) 40 MG tablet TAKE 1 TABLET DAILY  . predniSONE (DELTASONE) 10 MG tablet Take 10 mg by mouth as directed. Take 4 po qd x 2d then 3 po qd x 2d then  2 po qd x 2d then 1 po qd x 2d then stop  . sertraline (ZOLOFT) 50 MG tablet TAKE (1) TABLET DAILY IN THE MORNING.  . simvastatin (ZOCOR) 10 MG tablet TAKE 1 TABLET ONCE DAILY IN THE EVENING (Patient taking differently: Take 10 mg by mouth every evening.)   No facility-administered encounter medications on file as of 03/28/2020.    Past Medical History:  Diagnosis Date  . Arthritis    "all over"  . Atrial fibrillation ([place])   . Benign localized prostatic hyperplasia with lower urinary tract symptoms (LUTS)   . Bradycardia 11/28/2015   Severe-HR in the 20s to 30s following intubation for urological procedure.  Marland Kitchen CAD- non obstructive disease by cath 3/14 cardiologist-  dr hochrein  . Chronic lower back pain   . COPD (chronic obstructive pulmonary disease) ([place])   . DDD (degenerative disc disease)   . Depression   . Dyspnea    w/ exertion and lying down (raise head)  . ETOH abuse   . GERD (gastroesophageal reflux disease)   . Glaucoma   . Glaucoma, both eyes   . History of bladder cancer urologist-- dr Jeffie Pollock   first dx 2012--  s/p TURBT's  . History of DVT of lower extremity 03/2013   bilateral   . History of pulmonary embolus (PE) 04/2013  . HTN (hypertension)   . Hyperlipidemia   . Hypertension   . LBBB (left bundle branch block)    chronic  . Non-ischemic cardiomyopathy ([place])    last echo 01-18-2017, ef 35-40%  . NSVT (nonsustained ventricular tachycardia) ([place])    a. NSVT 06/2012; NSVT also seen during 03/2013 adm. b. Med rx. Not candidate for ICD given adv age.  Marland Kitchen PAD (peripheral artery disease) ([place])   . 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 PVCs (premature ventricular contractions)   . PVD (peripheral vascular disease) (H[facility])    Right ABI .75, Left .78 (2006)  . Syncope 06/2012   a. Felt to be postural syncope related to diuretics 06/2012.  Marland Kitchen Systolic and diastolic CHF,  chronic (HCC) cardiologsit-  dr hochrein   a. NICM - patent cors 06/2012, EF 40% at that time. b. 03/2013 eval: EF 20-25%.  . Vision loss, left eye    due to glaucoma  . Wears glasses     Past Surgical History:  Procedure Laterality Date  .  AMPUTATION Left 10/16/2014   Procedure: AMPUTATION ABOVE KNEE- LEFT;  Surgeon: Mal Misty, MD;  Location: [facility];  Service: Vascular;  Laterality: Left;  . CARDIAC CATHETERIZATION  05-11-2004  [facility]   MINIMAL CORANARY PLAQUE/ NORMAL LVF/ EF 55%/  NON-OBSTRUCTIVE LAD 25%  . CARDIAC CATHETERIZATION  06/18/2012   dr hochrein   mild luminal irregularities of coronaries, ef 45-50%  . CARDIOVASCULAR STRESS TEST  10-15-2010   LOW RISK NUCLEAR STUDY/ NO EVIDENCE OF ISCHEMIA/ NORMAL EF  . CATARACT EXTRACTION W/ INTRAOCULAR LENS  IMPLANT, BILATERAL Bilateral   . CYSTOSCOPY  10/07/2011   Procedure: CYSTOSCOPY;  Surgeon: Malka So, MD;  Location: [facility];  Service: Urology;  Laterality: N/A;  . CYSTOSCOPY N/A 10/20/2018   Procedure: CYSTOSCOPY WITH FULGURATION OF PROSTATIC URETHRAL TUMOR;  Surgeon: [name], MD;  Location: [facility];  Service: Urology;  Laterality: N/A;  . CYSTOSCOPY N/A 10/11/2019   Procedure: CYSTOSCOPY;  Surgeon: Cleon Gustin, MD;  Location: [facility];  Service: Urology;  Laterality: N/A;  . CYSTOSCOPY W/ RETROGRADES Bilateral 03/04/2020   Procedure: CYSTOSCOPY WITH BILATERAL RETROGRADE;  Surgeon: [name], MD;  Location: [facility];  Service: Urology;  Laterality: Bilateral;  . CYSTOSCOPY WITH BIOPSY  03/14/2012   Procedure: CYSTOSCOPY WITH BIOPSY;  Surgeon: Malka So, MD;  Location: [facility];  Service: Urology;  Laterality: N/A;  WITH FULGURATION  . CYSTOSCOPY WITH BIOPSY N/A 12/09/2016   Procedure: CYSTOSCOPY WITH BIOPSY AND FULGURATION;  Surgeon: [name], MD;  Location: [facility];  Service: Urology;  Laterality: N/A;  . CYSTOSCOPY WITH BIOPSY N/A 12/20/2017   Procedure: CYSTOSCOPY WITH BIOPSY WITH FULGURATION;   Surgeon: [name], MD;  Location[facility];  Service: Urology;  Laterality: N/A;  . CYSTOSCOPY WITH BIOPSY N/A 06/09/2018   Procedure: CYSTOSCOPY WITH BIOPSY AND FULGURATION;  Surgeon: [name], MD;  Location: [facility];  Service: Urology;  Laterality: N/A;  . CYSTOSCOPY WITH FULGERATION N/A 01/20/2016   Procedure: CYSTOSCOPY, BIOPSY WITH FULGERATION OF URTHRAL TUMOR;  Surgeon: [name], MD;  Location[facility];  Service: Urology;  Laterality: N/A;  . CYSTOSCOPY WITH FULGERATION N/A 06/01/2016   Procedure: CYSTOSCOPY WITH FULGERATION urethral tumors and bladder neck;  Surgeon: [name], MD;  Location[facility];  Service: Urology;  Laterality: N/A;  . SHOULDER SURGERY Left 1970's  . TONSILLECTOMY    . TRANSTHORACIC ECHOCARDIOGRAM  01-18-2017   dr hochrein   moderate LVH, ef 35-40%, diffuse hypokinesis/  trivial AR and MR/  mild TR  . TRANSURETHRAL RESECTION OF BLADDER TUMOR  10/07/2011   Procedure: TRANSURETHRAL RESECTION OF BLADDER TUMOR (TURBT);  Surgeon: Malka So, MD;  Location[facility];  Service: Urology;  Laterality: N/A;  . TRANSURETHRAL RESECTION OF BLADDER TUMOR N/A 07/10/2015   Procedure: TRANSURETHRAL RESECTION OF BLADDER TUMOR (TURBT), CYSTOSCOPY ;  Surgeon: [name], MD;  [facility];  Service: Urology;  Laterality: N/A;  . TRANSURETHRAL RESECTION OF BLADDER TUMOR N/A 10/11/2019   Procedure: TRANSURETHRAL RESECTION OF PROSTATIC URETHRAL TUMOR;  Surgeon: Cleon Gustin, MD;  Location[facility];  Service: Urology;  Laterality: N/A;  . TRANSURETHRAL RESECTION OF BLADDER TUMOR WITH MITOMYCIN-C N/A 03/04/2020   Procedure: TRANSURETHRAL RESECTION OF BLADDER TUMOR WITH GEMCITABINE;  Surgeon: [name], MD;  [facility];  Service: Urology;  Laterality: N/A;  . TRANSURETHRAL RESECTION OF PROSTATE  03/14/2012   Procedure: TRANSURETHRAL RESECTION OF THE PROSTATE WITH GYRUS INSTRUMENTS;  Surgeon: Malka So, MD;  [facility];  Service: Urology;  Laterality:  N/A;    Social History   Socioeconomic History  . Marital status: Married    Spouse name: Not on file  . Number of children: 1  . Years of education: Not on file  . Highest education level: Not on file  Occupational History  . Occupation: Retired  Tobacco Use  . Smoking status: Former Smoker    Packs/day: 1.00    Years: 35.00    Pack years: 35.00    Types: Cigarettes    Start date: 04/12/1942    Quit date: 04/13/1975    Years since quitting: 44.9  . Smokeless tobacco: Never Used  Vaping Use  . Vaping Use: Never used  Substance and Sexual Activity  . Alcohol use: Not Currently    Alcohol/week: 7.0 standard drinks    Types: 7 Cans of beer per week  . Drug use: No  . Sexual activity: Never  Other Topics Concern  . Not on file  Social History Narrative   Lives in [place] with spouse who is s/p spinal cord injury.  He has one grown son who lives in [place].  He is retired but worked for [employer] for years and lives in [place].   Social Determinants of Health   Financial Resource Strain: Not on file  Food Insecurity: Not on file  Transportation Needs: Not on file  Physical Activity: Not on file  Stress: Not on file  Social Connections: Not on file  Intimate Partner Violence: Not on file    Family History  Problem Relation Age of Onset  . Hypertension Mother   . Arthritis Mother   . Lung cancer Sister   . Deep vein thrombosis Father   . Early death Brother        Objective: Vitals:   03/28/20 1528  BP: 136/82  Pulse: 77  Temp: 98.9 F (37.2 C)     Physical Exam  Lab Results:    BMET  PSA  No results found for: TESTOSTERONE  UA reviewed.   Studies/Results: Path report revierwed.  See above.     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   He recurred aggressively only 4 months after his prior resection.  I think  despite his age and comorbidities we need to consider BCG treatments.  I have reviewed the risk of the BCG including the increased risks associated with his age and comorbidites which could result is severe or life threatening side effects.     No orders of the defined types were placed in this encounter.    Orders Placed This Encounter  Procedures  . Microscopic Examination  . Urinalysis, Routine w reflex microscopic      Return for He needs to return for BCG weekly x 6 with 1/2 dose BCG starting in the next 2-3 weeks. .   CC: Dettinger, Fransisca Kaufmann, MD      [name] 03/29/2020

## 2020-03-31 ENCOUNTER — Other Ambulatory Visit: Payer: Self-pay | Admitting: Family Medicine

## 2020-03-31 DIAGNOSIS — C679 Malignant neoplasm of bladder, unspecified: Secondary | ICD-10-CM | POA: Diagnosis not present

## 2020-03-31 DIAGNOSIS — Z483 Aftercare following surgery for neoplasm: Secondary | ICD-10-CM | POA: Diagnosis not present

## 2020-03-31 DIAGNOSIS — I5042 Chronic combined systolic (congestive) and diastolic (congestive) heart failure: Secondary | ICD-10-CM

## 2020-03-31 DIAGNOSIS — N1831 Chronic kidney disease, stage 3a: Secondary | ICD-10-CM | POA: Diagnosis not present

## 2020-03-31 DIAGNOSIS — I5021 Acute systolic (congestive) heart failure: Secondary | ICD-10-CM | POA: Diagnosis not present

## 2020-03-31 DIAGNOSIS — I130 Hypertensive heart and chronic kidney disease with heart failure and stage 1 through stage 4 chronic kidney disease, or unspecified chronic kidney disease: Secondary | ICD-10-CM | POA: Diagnosis not present

## 2020-04-01 DIAGNOSIS — Z483 Aftercare following surgery for neoplasm: Secondary | ICD-10-CM | POA: Diagnosis not present

## 2020-04-01 DIAGNOSIS — I130 Hypertensive heart and chronic kidney disease with heart failure and stage 1 through stage 4 chronic kidney disease, or unspecified chronic kidney disease: Secondary | ICD-10-CM | POA: Diagnosis not present

## 2020-04-01 DIAGNOSIS — I5021 Acute systolic (congestive) heart failure: Secondary | ICD-10-CM | POA: Diagnosis not present

## 2020-04-01 DIAGNOSIS — N1831 Chronic kidney disease, stage 3a: Secondary | ICD-10-CM | POA: Diagnosis not present

## 2020-04-01 DIAGNOSIS — I5042 Chronic combined systolic (congestive) and diastolic (congestive) heart failure: Secondary | ICD-10-CM | POA: Diagnosis not present

## 2020-04-01 DIAGNOSIS — C679 Malignant neoplasm of bladder, unspecified: Secondary | ICD-10-CM | POA: Diagnosis not present

## 2020-04-07 DIAGNOSIS — I130 Hypertensive heart and chronic kidney disease with heart failure and stage 1 through stage 4 chronic kidney disease, or unspecified chronic kidney disease: Secondary | ICD-10-CM | POA: Diagnosis not present

## 2020-04-07 DIAGNOSIS — Z483 Aftercare following surgery for neoplasm: Secondary | ICD-10-CM | POA: Diagnosis not present

## 2020-04-07 DIAGNOSIS — I5021 Acute systolic (congestive) heart failure: Secondary | ICD-10-CM | POA: Diagnosis not present

## 2020-04-07 DIAGNOSIS — C679 Malignant neoplasm of bladder, unspecified: Secondary | ICD-10-CM | POA: Diagnosis not present

## 2020-04-07 DIAGNOSIS — I5042 Chronic combined systolic (congestive) and diastolic (congestive) heart failure: Secondary | ICD-10-CM | POA: Diagnosis not present

## 2020-04-07 DIAGNOSIS — N1831 Chronic kidney disease, stage 3a: Secondary | ICD-10-CM | POA: Diagnosis not present

## 2020-04-09 ENCOUNTER — Other Ambulatory Visit: Payer: Self-pay | Admitting: Family Medicine

## 2020-04-10 ENCOUNTER — Telehealth: Payer: Self-pay

## 2020-04-10 DIAGNOSIS — J4520 Mild intermittent asthma, uncomplicated: Secondary | ICD-10-CM

## 2020-04-14 ENCOUNTER — Telehealth: Payer: Self-pay

## 2020-04-14 NOTE — Telephone Encounter (Signed)
BCG is a live but weakened bacteria and there are risks of infection with it is a small number of people.  If he is concerned about being able to hold the med, we could either give him samples of Gemtesa and see if that improves the incontinence or place a foley and keep him in the office with the foley plugged for the 2 hour dwell time.

## 2020-04-14 NOTE — Telephone Encounter (Signed)
LMTCB to find out where nebulizer came from

## 2020-04-15 ENCOUNTER — Telehealth: Payer: Self-pay

## 2020-04-15 NOTE — Telephone Encounter (Signed)
Pts son was notified of last response from Dr. Annabell Howells. More questions were forwarded on to Dr.

## 2020-04-16 ENCOUNTER — Other Ambulatory Visit: Payer: Self-pay | Admitting: Family Medicine

## 2020-04-16 ENCOUNTER — Ambulatory Visit: Payer: BC Managed Care – PPO

## 2020-04-16 NOTE — Telephone Encounter (Signed)
Left message on sons phone to call back.

## 2020-04-16 NOTE — Telephone Encounter (Signed)
The condom cath wouldn't help him retain it but we could try it and see how he does.  I would likd him to just have the 1/2 dose of the BCG.

## 2020-04-16 NOTE — Addendum Note (Signed)
Addended by: Julious Payer D on: 04/16/2020 04:33 PM   Modules accepted: Orders

## 2020-04-16 NOTE — Telephone Encounter (Signed)
Please sign pending order and bring to [facility] office to be faxed to [facility]

## 2020-04-17 ENCOUNTER — Other Ambulatory Visit: Payer: Self-pay | Admitting: Family Medicine

## 2020-04-17 ENCOUNTER — Telehealth: Payer: Self-pay | Admitting: *Deleted

## 2020-04-17 DIAGNOSIS — Z483 Aftercare following surgery for neoplasm: Secondary | ICD-10-CM | POA: Diagnosis not present

## 2020-04-17 DIAGNOSIS — N1831 Chronic kidney disease, stage 3a: Secondary | ICD-10-CM | POA: Diagnosis not present

## 2020-04-17 DIAGNOSIS — I5021 Acute systolic (congestive) heart failure: Secondary | ICD-10-CM | POA: Diagnosis not present

## 2020-04-17 DIAGNOSIS — I130 Hypertensive heart and chronic kidney disease with heart failure and stage 1 through stage 4 chronic kidney disease, or unspecified chronic kidney disease: Secondary | ICD-10-CM | POA: Diagnosis not present

## 2020-04-17 DIAGNOSIS — I5042 Chronic combined systolic (congestive) and diastolic (congestive) heart failure: Secondary | ICD-10-CM | POA: Diagnosis not present

## 2020-04-17 DIAGNOSIS — C679 Malignant neoplasm of bladder, unspecified: Secondary | ICD-10-CM | POA: Diagnosis not present

## 2020-04-17 MED ORDER — FLUTICASONE PROPIONATE 50 MCG/ACT NA SUSP: 1.0000 | Freq: Two times a day (BID) | NASAL | 2 refills | Status: DC | PRN

## 2020-04-17 NOTE — Telephone Encounter (Signed)
Printed order, we need to fax it, thanks

## 2020-04-17 NOTE — Addendum Note (Signed)
Addended by: Arville Care on: 04/17/2020 03:42 PM   Modules accepted: Orders

## 2020-04-17 NOTE — Telephone Encounter (Signed)
TC w/ Inetta Fermo from [facility] Pt has constant runny nose, productive cough, eyes are water, matted in the morning, lungs are clear, no fever, no contact with anyone with COVID. Can nasal spray and/or abx be called into [facility] pharmacy

## 2020-04-17 NOTE — Addendum Note (Signed)
Addended by: Arville Care on: 04/17/2020 12:52 PM   Modules accepted: Orders

## 2020-04-17 NOTE — Telephone Encounter (Signed)
Order reprinted, did not print originally, faxed to [facility]

## 2020-04-17 NOTE — Telephone Encounter (Signed)
Sent Flonase for the patient, but has never worsens.

## 2020-04-18 NOTE — Telephone Encounter (Signed)
Pt son called back. He wishes for condom cath for after case use to help with the 6 hour span of cleaning after BCG treatment.   Prescription was written for condom cath for pt by Dr. Jeffie Pollock. Son aware Rx left at desk for son to have filled. Son, [name], voiced understanding.

## 2020-04-22 ENCOUNTER — Other Ambulatory Visit: Payer: Self-pay

## 2020-04-22 ENCOUNTER — Ambulatory Visit (INDEPENDENT_AMBULATORY_CARE_PROVIDER_SITE_OTHER): Payer: Medicare Other

## 2020-04-22 ENCOUNTER — Ambulatory Visit: Payer: BC Managed Care – PPO

## 2020-04-22 DIAGNOSIS — C678 Malignant neoplasm of overlapping sites of bladder: Secondary | ICD-10-CM

## 2020-04-22 DIAGNOSIS — Z8551 Personal history of malignant neoplasm of bladder: Secondary | ICD-10-CM

## 2020-04-22 LAB — URINALYSIS, ROUTINE W REFLEX MICROSCOPIC
Bilirubin, UA: NEGATIVE
Glucose, UA: NEGATIVE
Ketones, UA: NEGATIVE
Leukocytes,UA: NEGATIVE
Nitrite, UA: NEGATIVE
Protein,UA: NEGATIVE
Specific Gravity, UA: 1.02 (ref 1.005–1.030)
Urobilinogen, Ur: 1 mg/dL (ref 0.2–1.0)
pH, UA: 6.5 (ref 5.0–7.5)

## 2020-04-22 LAB — MICROSCOPIC EXAMINATION
Bacteria, UA: NONE SEEN
Epithelial Cells (non renal): NONE SEEN /hpf (ref 0–10)
Renal Epithel, UA: NONE SEEN /hpf
WBC, UA: NONE SEEN /hpf (ref 0–5)

## 2020-04-22 MED ORDER — BCG LIVE 50 MG IS SUSR
3.2400 mL | Freq: Once | INTRAVESICAL | Status: AC
  Administered 2020-04-22: 81 mg via INTRAVESICAL

## 2020-04-22 NOTE — Progress Notes (Addendum)
BCG Bladder Instillation  BCG # 1  Due to Bladder Cancer patient is present today for a BCG treatment. Patient was cleaned and prepped in a sterile fashion with betadine. A 16FR catheter was inserted, urine return was noted 17ml, urine was yellow in color.  93ml of reconstituted BCG was instilled into the bladder. Patient given 1/2 dose. The catheter was kept in place for 2 hrs. Foley catheter was then removed and condom cath placed.  Condom cath will be removed after 6 hrs. Patient tolerated well, no complications were noted  Performed by: Ottavio Norem, LPN  Follow up/ Additional notes: Salisa Broz,lpn

## 2020-04-22 NOTE — Patient Instructions (Signed)
Patient Education: (BCG) Into the Bladder (Intravesical Chemotherapy)  BCG is a vaccine which is used to prevent tuberculosis (TB).  But it's also a helpful treatment for some early bladder cancers.  When BCG goes directly into the bladder the treatment is described as intravesical.  BCG is a type of immunotherapy.  Immunotherapy stimulates the body's immune system to destroy cancer cells.  How it's given BCG treatment is given to you in an outpatient setting.  It takes a few minutes to administer and you can go home as soon as it's finished.  It might be a good idea to ask someone to bring you, particularly the fist time.  Unlike chemotherapy into the bladder, BCG treatment is never given immediately after surgery to remove bladder tumors.  There needs to be a delay usually of at least two weeks after surgery, before you can have it.  You won't be given treatment with BCG if you are unwell or have an infection in your urine.  You're usually asked to limit the amount you drink before your treatment.  This will help to increase the concentration of BCG in your bladder.  Drinking too much before your treatment may make your bladder feel uncomfortably full.  If you normally take water tablets (diuretics) take them later in the day after your treatment.  Your nurse or doctor will give you more advise about preparing for your treatment.  You will have a small tube (catheter) placed into your bladder.  Your doctor will then put the liquid vaccine directly into your bladder through the catheter and remove the catheter.  You will need to hold your urine for two hours afterwards.  This can be difficult but it's to give the treatment time to work.  You can walk around during this time.  When the treatment is over you can go to the toilet.  After your treatment there are some precautions you'll need to take.  This is because BCG is a live vaccine and other people shouldn't be exposed to it.  For the next six  hours, you'll need to avoid your urine splashing on the toilet seat and getting any urine on your hands.  It might be easer for men to sit down when they're using an ordinary toilet although using a stand up urinal should be alright.  The main this is to avoid splashing urine and spreading the vaccine.  You will also be asked to put 1/2 cup undiluted bleach into the toilet to destroy any live vaccine and leave it for 15 minutes until you flush.  Side Effects Because BCG goes directly into the bladder most of the side effects are linked with the bladder.  They usually go away within one to two days after your treatment.  The most common ones are: -needing to pass urine often -pain when you pass urine -blood in urine -flu-like symptoms (tiredness, general aching and raised temperature)  Theses side effects should settle down within a day or two.  If they don't get better contact your doctor.  Drinking lots of fluids can help flush the drug out of your bladder and reduce some of these effects.  Taking Ibuprofen or Aleve is encouraged unless you have a condition that would make these medications unsafe to take (renal failure, diabetes, gerd)  Rare side effects can include a continuing high temperature (fever), pain in your joints and a cough.  If you have any of these symptoms, or if you feel generally unwell, contact your   doctor.  These symptoms could be a sign of a more serious infection (due to BCG) that needs to be treated immediately.  If this happens you'll be treated with the same drugs (antibiotics) that are used to treat TB.  Contraception Men should use a condom during sex for the first 48 hours after their treatment.  If you are a women who has had BCG treatment then your partner should use a condom.  Using a condom will protect your partner from any vaccine present in your semen or vaginal fluid.  We don't know how BCG may affect a developing fetus so it's not advisable to become pregnant or  father a child while having it.  It is important to use effective contraception during your treatment and for six weeks afterwards.  You can discuss this with your doctor or specialist nurse.   

## 2020-04-23 ENCOUNTER — Ambulatory Visit: Payer: BC Managed Care – PPO

## 2020-04-29 ENCOUNTER — Ambulatory Visit: Payer: BC Managed Care – PPO

## 2020-04-29 ENCOUNTER — Telehealth: Payer: Self-pay | Admitting: Urology

## 2020-04-29 NOTE — Telephone Encounter (Signed)
Returned caretakers call and bcg #2 moved to the end of treatment cycle due to inclement weather.

## 2020-04-29 NOTE — Telephone Encounter (Signed)
FYI  Please see prior note

## 2020-04-29 NOTE — Telephone Encounter (Signed)
Patients caretaker called and asked if pts BCG from this week can be added on to the end of his treatments? He is temporarily rescheduled for tomorrow but pts family doesn't want to come this week due to weather. He asked for a nurse return call.

## 2020-04-30 ENCOUNTER — Other Ambulatory Visit: Payer: Self-pay | Admitting: Family Medicine

## 2020-04-30 ENCOUNTER — Ambulatory Visit: Payer: BC Managed Care – PPO

## 2020-04-30 DIAGNOSIS — I5042 Chronic combined systolic (congestive) and diastolic (congestive) heart failure: Secondary | ICD-10-CM

## 2020-04-30 NOTE — Telephone Encounter (Signed)
Thanks

## 2020-05-06 ENCOUNTER — Other Ambulatory Visit: Payer: Self-pay

## 2020-05-06 ENCOUNTER — Ambulatory Visit: Payer: BC Managed Care – PPO

## 2020-05-06 ENCOUNTER — Ambulatory Visit (INDEPENDENT_AMBULATORY_CARE_PROVIDER_SITE_OTHER): Payer: Medicare Other

## 2020-05-06 DIAGNOSIS — C678 Malignant neoplasm of overlapping sites of bladder: Secondary | ICD-10-CM

## 2020-05-06 LAB — MICROSCOPIC EXAMINATION
Bacteria, UA: NONE SEEN
Epithelial Cells (non renal): NONE SEEN /hpf (ref 0–10)
Renal Epithel, UA: NONE SEEN /hpf
WBC, UA: NONE SEEN /hpf (ref 0–5)

## 2020-05-06 LAB — URINALYSIS, ROUTINE W REFLEX MICROSCOPIC
Bilirubin, UA: NEGATIVE
Glucose, UA: NEGATIVE
Ketones, UA: NEGATIVE
Leukocytes,UA: NEGATIVE
Nitrite, UA: NEGATIVE
Protein,UA: NEGATIVE
Specific Gravity, UA: 1.015 (ref 1.005–1.030)
Urobilinogen, Ur: 0.2 mg/dL (ref 0.2–1.0)
pH, UA: 5 (ref 5.0–7.5)

## 2020-05-06 MED ORDER — BCG LIVE 50 MG IS SUSR
3.2400 mL | Freq: Once | INTRAVESICAL | Status: AC
  Administered 2020-05-06: 81 mg via INTRAVESICAL

## 2020-05-06 NOTE — Patient Instructions (Signed)
Patient Education: (BCG) Into the Bladder (Intravesical Chemotherapy)  BCG is a vaccine which is used to prevent tuberculosis (TB).  But it's also a helpful treatment for some early bladder cancers.  When BCG goes directly into the bladder the treatment is described as intravesical.  BCG is a type of immunotherapy.  Immunotherapy stimulates the body's immune system to destroy cancer cells.  How it's given BCG treatment is given to you in an outpatient setting.  It takes a few minutes to administer and you can go home as soon as it's finished.  It might be a good idea to ask someone to bring you, particularly the fist time.  Unlike chemotherapy into the bladder, BCG treatment is never given immediately after surgery to remove bladder tumors.  There needs to be a delay usually of at least two weeks after surgery, before you can have it.  You won't be given treatment with BCG if you are unwell or have an infection in your urine.  You're usually asked to limit the amount you drink before your treatment.  This will help to increase the concentration of BCG in your bladder.  Drinking too much before your treatment may make your bladder feel uncomfortably full.  If you normally take water tablets (diuretics) take them later in the day after your treatment.  Your nurse or doctor will give you more advise about preparing for your treatment.  You will have a small tube (catheter) placed into your bladder.  Your doctor will then put the liquid vaccine directly into your bladder through the catheter and remove the catheter.  You will need to hold your urine for two hours afterwards.  This can be difficult but it's to give the treatment time to work.  You can walk around during this time.  When the treatment is over you can go to the toilet.  After your treatment there are some precautions you'll need to take.  This is because BCG is a live vaccine and other people shouldn't be exposed to it.  For the next six  hours, you'll need to avoid your urine splashing on the toilet seat and getting any urine on your hands.  It might be easer for men to sit down when they're using an ordinary toilet although using a stand up urinal should be alright.  The main this is to avoid splashing urine and spreading the vaccine.  You will also be asked to put 1/2 cup undiluted bleach into the toilet to destroy any live vaccine and leave it for 15 minutes until you flush.  Side Effects Because BCG goes directly into the bladder most of the side effects are linked with the bladder.  They usually go away within one to two days after your treatment.  The most common ones are: -needing to pass urine often -pain when you pass urine -blood in urine -flu-like symptoms (tiredness, general aching and raised temperature)  Theses side effects should settle down within a day or two.  If they don't get better contact your doctor.  Drinking lots of fluids can help flush the drug out of your bladder and reduce some of these effects.  Taking Ibuprofen or Aleve is encouraged unless you have a condition that would make these medications unsafe to take (renal failure, diabetes, gerd)  Rare side effects can include a continuing high temperature (fever), pain in your joints and a cough.  If you have any of these symptoms, or if you feel generally unwell, contact your   doctor.  These symptoms could be a sign of a more serious infection (due to BCG) that needs to be treated immediately.  If this happens you'll be treated with the same drugs (antibiotics) that are used to treat TB.  Contraception Men should use a condom during sex for the first 48 hours after their treatment.  If you are a women who has had BCG treatment then your partner should use a condom.  Using a condom will protect your partner from any vaccine present in your semen or vaginal fluid.  We don't know how BCG may affect a developing fetus so it's not advisable to become pregnant or  father a child while having it.  It is important to use effective contraception during your treatment and for six weeks afterwards.  You can discuss this with your doctor or specialist nurse.   

## 2020-05-06 NOTE — Progress Notes (Addendum)
BCG Bladder Instillation  BCG # 2 of 6  Due to Bladder Cancer patient is present today for a BCG treatment. Patient was cleaned and prepped in a sterile fashion with betadine. A 16FR catheter was inserted, urine return was noted 69ml, urine was yellow in color.  68ml of reconstituted BCG was instilled into the bladder. Patient was given 1/2 dose. The catheter was then capped for 2 hrs then a condom catheter was applied to patient.   Patient tolerated well, no complications were noted  Performed by: Walterine Amodei, lpn  Follow up/ Additional notes: Keep next scheduled NV

## 2020-05-07 ENCOUNTER — Ambulatory Visit: Payer: BC Managed Care – PPO

## 2020-05-13 ENCOUNTER — Other Ambulatory Visit: Payer: Self-pay

## 2020-05-13 ENCOUNTER — Ambulatory Visit (INDEPENDENT_AMBULATORY_CARE_PROVIDER_SITE_OTHER): Payer: Medicare Other

## 2020-05-13 DIAGNOSIS — C678 Malignant neoplasm of overlapping sites of bladder: Secondary | ICD-10-CM

## 2020-05-13 LAB — URINALYSIS, ROUTINE W REFLEX MICROSCOPIC
Bilirubin, UA: NEGATIVE
Glucose, UA: NEGATIVE
Ketones, UA: NEGATIVE
Leukocytes,UA: NEGATIVE
Nitrite, UA: NEGATIVE
Specific Gravity, UA: 1.015 (ref 1.005–1.030)
Urobilinogen, Ur: 1 mg/dL (ref 0.2–1.0)
pH, UA: 6 (ref 5.0–7.5)

## 2020-05-13 LAB — MICROSCOPIC EXAMINATION
RBC, Urine: >30 /hpf — AB (ref 0–2)
Renal Epithel, UA: NONE SEEN /hpf
WBC, UA: NONE SEEN /hpf (ref 0–5)

## 2020-05-13 MED ORDER — BCG LIVE 50 MG IS SUSR
3.2400 mL | Freq: Once | INTRAVESICAL | Status: AC
Start: 2020-05-13 — End: 2020-05-13
  Administered 2020-05-13: 81 mg via INTRAVESICAL

## 2020-05-13 NOTE — Patient Instructions (Signed)
Patient Education: (BCG) Into the Bladder (Intravesical Chemotherapy)  BCG is a vaccine which is used to prevent tuberculosis (TB).  But it's also a helpful treatment for some early bladder cancers.  When BCG goes directly into the bladder the treatment is described as intravesical.  BCG is a type of immunotherapy.  Immunotherapy stimulates the body's immune system to destroy cancer cells.  How it's given BCG treatment is given to you in an outpatient setting.  It takes a few minutes to administer and you can go home as soon as it's finished.  It might be a good idea to ask someone to bring you, particularly the fist time.  Unlike chemotherapy into the bladder, BCG treatment is never given immediately after surgery to remove bladder tumors.  There needs to be a delay usually of at least two weeks after surgery, before you can have it.  You won't be given treatment with BCG if you are unwell or have an infection in your urine.  You're usually asked to limit the amount you drink before your treatment.  This will help to increase the concentration of BCG in your bladder.  Drinking too much before your treatment may make your bladder feel uncomfortably full.  If you normally take water tablets (diuretics) take them later in the day after your treatment.  Your nurse or doctor will give you more advise about preparing for your treatment.  You will have a small tube (catheter) placed into your bladder.  Your doctor will then put the liquid vaccine directly into your bladder through the catheter and remove the catheter.  You will need to hold your urine for two hours afterwards.  This can be difficult but it's to give the treatment time to work.  You can walk around during this time.  When the treatment is over you can go to the toilet.  After your treatment there are some precautions you'll need to take.  This is because BCG is a live vaccine and other people shouldn't be exposed to it.  For the next six  hours, you'll need to avoid your urine splashing on the toilet seat and getting any urine on your hands.  It might be easer for men to sit down when they're using an ordinary toilet although using a stand up urinal should be alright.  The main this is to avoid splashing urine and spreading the vaccine.  You will also be asked to put 1/2 cup undiluted bleach into the toilet to destroy any live vaccine and leave it for 15 minutes until you flush.  Side Effects Because BCG goes directly into the bladder most of the side effects are linked with the bladder.  They usually go away within one to two days after your treatment.  The most common ones are: -needing to pass urine often -pain when you pass urine -blood in urine -flu-like symptoms (tiredness, general aching and raised temperature)  Theses side effects should settle down within a day or two.  If they don't get better contact your doctor.  Drinking lots of fluids can help flush the drug out of your bladder and reduce some of these effects.  Taking Ibuprofen or Aleve is encouraged unless you have a condition that would make these medications unsafe to take (renal failure, diabetes, gerd)  Rare side effects can include a continuing high temperature (fever), pain in your joints and a cough.  If you have any of these symptoms, or if you feel generally unwell, contact your   doctor.  These symptoms could be a sign of a more serious infection (due to BCG) that needs to be treated immediately.  If this happens you'll be treated with the same drugs (antibiotics) that are used to treat TB.  Contraception Men should use a condom during sex for the first 48 hours after their treatment.  If you are a women who has had BCG treatment then your partner should use a condom.  Using a condom will protect your partner from any vaccine present in your semen or vaginal fluid.  We don't know how BCG may affect a developing fetus so it's not advisable to become pregnant or  father a child while having it.  It is important to use effective contraception during your treatment and for six weeks afterwards.  You can discuss this with your doctor or specialist nurse.   

## 2020-05-13 NOTE — Progress Notes (Addendum)
BCG Bladder Instillation  BCG # 3 of 6  Due to Bladder Cancer patient is present today for a BCG treatment. Patient was cleaned and prepped in a sterile fashion with betadine. A 16FR catheter was inserted, urine return was noted 10ml, urine was yellow in color. 70ml of reconstituted BCG was instilled into the bladder. Patient was given 1/2 dose. The catheter was then capped for 2 hrs then a condom catheter was applied to patient.   Patient tolerated well, no complications were note Performed by: Laterria [place], lpn  Follow up/ Additional notes: Next scheduled NV

## 2020-05-14 ENCOUNTER — Ambulatory Visit: Payer: BC Managed Care – PPO

## 2020-05-14 ENCOUNTER — Other Ambulatory Visit: Payer: Self-pay | Admitting: Family Medicine

## 2020-05-15 ENCOUNTER — Other Ambulatory Visit: Payer: Self-pay | Admitting: Family Medicine

## 2020-05-18 ENCOUNTER — Other Ambulatory Visit: Payer: Self-pay

## 2020-05-18 ENCOUNTER — Encounter (HOSPITAL_COMMUNITY): Payer: Self-pay

## 2020-05-18 ENCOUNTER — Emergency Department (HOSPITAL_COMMUNITY): Payer: Medicare Other

## 2020-05-18 ENCOUNTER — Emergency Department (HOSPITAL_COMMUNITY)
Admission: EM | Admit: 2020-05-18 | Discharge: 2020-05-18 | Disposition: A | Discharge status: Home / Self Care | Payer: Medicare Other | Attending: Emergency Medicine | Admitting: Emergency Medicine

## 2020-05-18 DIAGNOSIS — G309 Alzheimer's disease, unspecified: Secondary | ICD-10-CM | POA: Diagnosis not present

## 2020-05-18 DIAGNOSIS — I5042 Chronic combined systolic (congestive) and diastolic (congestive) heart failure: Secondary | ICD-10-CM | POA: Insufficient documentation

## 2020-05-18 DIAGNOSIS — Z87891 Personal history of nicotine dependence: Secondary | ICD-10-CM | POA: Insufficient documentation

## 2020-05-18 DIAGNOSIS — I110 Hypertensive heart disease with heart failure: Secondary | ICD-10-CM | POA: Insufficient documentation

## 2020-05-18 DIAGNOSIS — I5043 Acute on chronic combined systolic (congestive) and diastolic (congestive) heart failure: Secondary | ICD-10-CM | POA: Diagnosis not present

## 2020-05-18 DIAGNOSIS — R790 Abnormal level of blood mineral: Secondary | ICD-10-CM | POA: Insufficient documentation

## 2020-05-18 DIAGNOSIS — R5383 Other fatigue: Secondary | ICD-10-CM | POA: Insufficient documentation

## 2020-05-18 DIAGNOSIS — J449 Chronic obstructive pulmonary disease, unspecified: Secondary | ICD-10-CM | POA: Insufficient documentation

## 2020-05-18 DIAGNOSIS — I2510 Atherosclerotic heart disease of native coronary artery without angina pectoris: Secondary | ICD-10-CM | POA: Diagnosis not present

## 2020-05-18 DIAGNOSIS — R0689 Other abnormalities of breathing: Secondary | ICD-10-CM | POA: Diagnosis not present

## 2020-05-18 DIAGNOSIS — I447 Left bundle-branch block, unspecified: Secondary | ICD-10-CM | POA: Diagnosis not present

## 2020-05-18 DIAGNOSIS — R531 Weakness: Secondary | ICD-10-CM | POA: Diagnosis not present

## 2020-05-18 DIAGNOSIS — Z8551 Personal history of malignant neoplasm of bladder: Secondary | ICD-10-CM | POA: Diagnosis not present

## 2020-05-18 DIAGNOSIS — Z20822 Contact with and (suspected) exposure to covid-19: Secondary | ICD-10-CM | POA: Diagnosis not present

## 2020-05-18 DIAGNOSIS — Z7901 Long term (current) use of anticoagulants: Secondary | ICD-10-CM | POA: Insufficient documentation

## 2020-05-18 DIAGNOSIS — I10 Essential (primary) hypertension: Secondary | ICD-10-CM | POA: Diagnosis not present

## 2020-05-18 DIAGNOSIS — M6281 Muscle weakness (generalized): Secondary | ICD-10-CM | POA: Insufficient documentation

## 2020-05-18 DIAGNOSIS — R600 Localized edema: Secondary | ICD-10-CM

## 2020-05-18 DIAGNOSIS — R4182 Altered mental status, unspecified: Secondary | ICD-10-CM | POA: Diagnosis not present

## 2020-05-18 DIAGNOSIS — R030 Elevated blood-pressure reading, without diagnosis of hypertension: Secondary | ICD-10-CM

## 2020-05-18 DIAGNOSIS — R0902 Hypoxemia: Secondary | ICD-10-CM | POA: Diagnosis not present

## 2020-05-18 DIAGNOSIS — Z79899 Other long term (current) drug therapy: Secondary | ICD-10-CM | POA: Diagnosis not present

## 2020-05-18 DIAGNOSIS — R609 Edema, unspecified: Secondary | ICD-10-CM | POA: Diagnosis not present

## 2020-05-18 LAB — BASIC METABOLIC PANEL
Anion gap: 7 (ref 5–15)
BUN: 21 mg/dL (ref 8–23)
CO2: 31 mmol/L (ref 22–32)
Calcium: 8.9 mg/dL (ref 8.9–10.3)
Chloride: 100 mmol/L (ref 98–111)
Creatinine, Ser: 1.04 mg/dL (ref 0.61–1.24)
GFR, Estimated: >60 mL/min (ref >60)
Glucose, Bld: 96 mg/dL (ref 70–99)
Potassium: 3.8 mmol/L (ref 3.5–5.1)
Sodium: 138 mmol/L (ref 135–145)

## 2020-05-18 LAB — URINALYSIS, ROUTINE W REFLEX MICROSCOPIC
Bacteria, UA: NONE SEEN
Bilirubin Urine: NEGATIVE
Glucose, UA: NEGATIVE mg/dL
Ketones, ur: NEGATIVE mg/dL
Leukocytes,Ua: NEGATIVE
Nitrite: NEGATIVE
Protein, ur: NEGATIVE mg/dL
Specific Gravity, Urine: 1.009 (ref 1.005–1.030)
pH: 5 (ref 5.0–8.0)

## 2020-05-18 LAB — CBC
HCT: 40.5 % (ref 39.0–52.0)
Hemoglobin: 12.1 g/dL — ABNORMAL LOW (ref 13.0–17.0)
MCH: 26.5 pg (ref 26.0–34.0)
MCHC: 29.9 g/dL — ABNORMAL LOW (ref 30.0–36.0)
MCV: 88.6 fL (ref 80.0–100.0)
Platelets: 206 10*3/uL (ref 150–400)
RBC: 4.57 MIL/uL (ref 4.22–5.81)
RDW: 17.1 % — ABNORMAL HIGH (ref 11.5–15.5)
WBC: 3.6 10*3/uL — ABNORMAL LOW (ref 4.0–10.5)
nRBC: 0 % (ref 0.0–0.2)

## 2020-05-18 LAB — BRAIN NATRIURETIC PEPTIDE: B Natriuretic Peptide: 1846 pg/mL — ABNORMAL HIGH (ref 0.0–100.0)

## 2020-05-18 LAB — SARS CORONAVIRUS 2 BY RT PCR (HOSPITAL ORDER, PERFORMED IN ~~LOC~~ HOSPITAL LAB): SARS Coronavirus 2: NEGATIVE

## 2020-05-18 MED ORDER — FUROSEMIDE 40 MG PO TABS
20.0000 mg | ORAL_TABLET | Freq: Once | ORAL | Status: AC
  Administered 2020-05-18: 20 mg via ORAL
  Filled 2020-05-18: qty 1

## 2020-05-18 NOTE — ED Triage Notes (Addendum)
Pt brought to ED via [facility]. Pt caretaker stated he normally is alert, oriented, can assist to wheelchair, this morning pt seems more fatigue, tired, more swelling in right lower extremities. BP 190/110 per EMS. Pt sats on RA 86-88%

## 2020-05-18 NOTE — ED Provider Notes (Signed)
[facility] EMERGENCY DEPARTMENT Provider Note   CSN: [id number] Arrival date & time: 05/18/20  1718     History Chief Complaint  Patient presents with  . Altered Mental Status    Scott [name] is a 87 y.o. male.  Patient presents with general weakness, tired,  in past couple days. Symptoms gradual onset, mild-mod, constant, persistent. Pt denies any focal or unilateral numbness or weakness. Denies syncope, trauma or fall. Denies chest pain or discomfort. No sob. Occasional non prod cough. No definite known covid exposure. Denies fever or chills. No dysuria or gu c/o. No abd pain or nvd.   The history is provided by the patient and the EMS personnel.       Past Medical History:  Diagnosis Date  . Arthritis    "all over"  . Atrial fibrillation ([place])   . Benign localized prostatic hyperplasia with lower urinary tract symptoms (LUTS)   . Bradycardia 11/28/2015   Severe-HR in the 20s to 30s following intubation for urological procedure.  Marland Kitchen CAD- non obstructive disease by cath 3/14 cardiologist-  dr hochrein  . Chronic lower back pain   . COPD (chronic obstructive pulmonary disease) ([place])   . DDD (degenerative disc disease)   . Depression   . Dyspnea    w/ exertion and lying down (raise head)  . ETOH abuse   . GERD (gastroesophageal reflux disease)   . Glaucoma   . Glaucoma, both eyes   . History of bladder cancer urologist-- dr Jeffie Pollock   first dx 2012--  s/p TURBT's  . History of DVT of lower extremity 03/2013   bilateral   . History of pulmonary embolus (PE) 04/2013  . HTN (hypertension)   . Hyperlipidemia   . Hypertension   . LBBB (left bundle branch block)    chronic  . Non-ischemic cardiomyopathy ([place])    last echo 01-18-2017, ef 35-40%  . NSVT (nonsustained ventricular tachycardia) ([place])    a. NSVT 06/2012; NSVT also seen during 03/2013 adm. b. Med rx. Not candidate for ICD given adv age.  Marland Kitchen PAD (peripheral artery disease) ([place])   . Popliteal artery aneurysm,  bilateral (HCC)    DOCUMENTED CHRONIC PARTIAL OCCLUSION--  PT DENIES CLAUDICATION OR ANY OTHER SYMPTOMS  . PVCs (premature ventricular contractions)   . PVD (peripheral vascular disease) (HCC)    Right ABI .75, Left .78 (2006)  . Syncope 06/2012   a. Felt to be postural syncope related to diuretics 06/2012.  Marland Kitchen Systolic and diastolic CHF, chronic (HCC) cardiologsit-  dr hochrein   a. NICM - patent cors 06/2012, EF 40% at that time. b. 03/2013 eval: EF 20-25%.  . Vision loss, left eye    due to glaucoma  . Wears glasses     Patient Active Problem List   Diagnosis Date Noted  . Acute respiratory acidosis 03/05/2020  . Bladder cancer ([place]) 03/04/2020  . Hematuria 12/21/2019  . Supratherapeutic INR 12/21/2019  . Acute exacerbation of CHF (congestive heart failure) ([place]) 12/20/2019  . 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 Acute respiratory failure with hypoxia ([place])   . Alzheimer disease ([place]) 09/07/2018  . Paroxysmal atrial fibrillation ([place]) 01/18/2017  . PVC (pulmonary venous congestion) 01/18/2017  . Thoracic aortic atherosclerosis ([place]) 11/26/2016  . Thrombocytopenia ([place]) 08/04/2016  . Essential hypertension 11/29/2015  . Bradycardia 11/28/2015  . Malignant neoplasm of lateral wall of urinary bladder ([place]) 07/10/2015  . Status post above-knee amputation of left lower extremity ([place]) 10/18/2014  . Tachyarrhythmia 10/05/2014  .  Atherosclerotic PVD with ulceration ([place]) 10/03/2014  . PAD (peripheral artery disease) ([place]) 09/24/2014  . History of non-ST elevation myocardial infarction (NSTEMI) 10/09/2013  . High risk medication use 05/17/2013  . Vitamin D deficiency 05/17/2013  . History of pulmonary embolism (2014) 04/17/2013  . NSVT (nonsustained ventricular tachycardia)- not an ICD candidate 04/17/2013  . NICM (nonischemic cardiomyopathy)- EF 20-25% by Echo 04/12/13 04/17/2013  . Chronic combined systolic and diastolic heart failure ([place]) 03/15/2013  . DVT of lower  extremity, bilateral ([place]) 03/15/2013  . Constipation 11/02/2012  . Syncope 06/21/2012  . Benign localized hyperplasia of prostate with urinary obstruction 03/15/2012  . Hyperlipidemia   . PVCs (premature ventricular contractions)   . CAD- non obstructive disease by cath 3/14     Past Surgical History:  Procedure Laterality Date  . AMPUTATION Left 10/16/2014   Procedure: AMPUTATION ABOVE KNEE- LEFT;  Surgeon: Mal Misty, MD;  Location: [place];  Service: Vascular;  Laterality: Left;  . CARDIAC CATHETERIZATION  05-11-2004  DR [facility]   MINIMAL CORANARY PLAQUE/ NORMAL LVF/ EF 55%/  NON-OBSTRUCTIVE LAD 25%  . CARDIAC CATHETERIZATION  06/18/2012   dr hochrein   mild luminal irregularities of coronaries, ef 45-50%  . CARDIOVASCULAR STRESS TEST  10-15-2010   LOW RISK NUCLEAR STUDY/ NO EVIDENCE OF ISCHEMIA/ NORMAL EF  . CATARACT EXTRACTION W/ INTRAOCULAR LENS  IMPLANT, BILATERAL Bilateral   . CYSTOSCOPY  10/07/2011   Procedure: CYSTOSCOPY;  Surgeon: Malka So, MD;  Location: [facility];  Service: Urology;  Laterality: N/A;  . CYSTOSCOPY N/A 10/20/2018   Procedure: CYSTOSCOPY WITH FULGURATION OF PROSTATIC URETHRAL TUMOR;  Surgeon: [name], MD;  Location: [facility];  Service: Urology;  Laterality: N/A;  . CYSTOSCOPY N/A 10/11/2019   Procedure: CYSTOSCOPY;  Surgeon: Cleon Gustin, MD;  Location: [facility];  Service: Urology;  Laterality: N/A;  . CYSTOSCOPY W/ RETROGRADES Bilateral 03/04/2020   Procedure: CYSTOSCOPY WITH BILATERAL RETROGRADE;  Surgeon: [name], MD;  Location: [facility];  Service: Urology;  Laterality: Bilateral;  . CYSTOSCOPY WITH BIOPSY  03/14/2012   Procedure: CYSTOSCOPY WITH BIOPSY;  Surgeon: Malka So, MD;  Location: [facility];  Service: Urology;  Laterality: N/A;  WITH FULGURATION  . CYSTOSCOPY WITH BIOPSY N/A 12/09/2016   Procedure: CYSTOSCOPY WITH BIOPSY AND FULGURATION;  Surgeon: [name], MD;  Location: [facility];  Service: Urology;  Laterality: N/A;   . CYSTOSCOPY WITH BIOPSY N/A 12/20/2017   Procedure: CYSTOSCOPY WITH BIOPSY WITH FULGURATION;  Surgeon: [name], MD;  [facility];  Service: Urology;  Laterality: N/A;  . CYSTOSCOPY WITH BIOPSY N/A 06/09/2018   Procedure: CYSTOSCOPY WITH BIOPSY AND FULGURATION;  Surgeon: [name], MD;  Location: [facility];  Service: Urology;  Laterality: N/A;  . CYSTOSCOPY WITH FULGERATION N/A 01/20/2016   Procedure: CYSTOSCOPY, BIOPSY WITH FULGERATION OF URTHRAL TUMOR;  Surgeon: [name], MD;  Location: [facility];  Service: Urology;  Laterality: N/A;  . CYSTOSCOPY WITH FULGERATION N/A 06/01/2016   Procedure: CYSTOSCOPY WITH FULGERATION urethral tumors and bladder neck;  Surgeon: [name], MD;  Location[facility];  Service: Urology;  Laterality: N/A;  . SHOULDER SURGERY Left 1970's  . TONSILLECTOMY    . TRANSTHORACIC ECHOCARDIOGRAM  01-18-2017   dr hochrein   moderate LVH, ef 35-40%, diffuse hypokinesis/  trivial AR and MR/  mild TR  . TRANSURETHRAL RESECTION OF BLADDER TUMOR  10/07/2011   Procedure: TRANSURETHRAL RESECTION OF BLADDER TUMOR (TURBT);  Surgeon: Malka So, MD;  Location:  [place];  Service: Urology;  Laterality: N/A;  . TRANSURETHRAL RESECTION OF BLADDER TUMOR N/A 07/10/2015   Procedure: TRANSURETHRAL RESECTION OF BLADDER TUMOR (TURBT), CYSTOSCOPY ;  Surgeon: [name], MD;  [facility];  Service: Urology;  Laterality: N/A;  . TRANSURETHRAL RESECTION OF BLADDER TUMOR N/A 10/11/2019   Procedure: TRANSURETHRAL RESECTION OF PROSTATIC URETHRAL TUMOR;  Surgeon: Cleon Gustin, MD;  Location: [facility];  Service: Urology;  Laterality: N/A;  . TRANSURETHRAL RESECTION OF BLADDER TUMOR WITH MITOMYCIN-C N/A 03/04/2020   Procedure: TRANSURETHRAL RESECTION OF BLADDER TUMOR WITH GEMCITABINE;  Surgeon: [name], MD;  [facility];  Service: Urology;  Laterality: N/A;  . TRANSURETHRAL RESECTION OF PROSTATE  03/14/2012   Procedure: TRANSURETHRAL RESECTION OF THE PROSTATE WITH  GYRUS INSTRUMENTS;  Surgeon: Malka So, MD;  [facility];  Service: Urology;  Laterality: N/A;       Family History  Problem Relation Age of Onset  . Hypertension Mother   . Arthritis Mother   . Lung cancer Sister   . Deep vein thrombosis Father   . Early death Brother     Social History   Tobacco Use  . Smoking status: Former Smoker    Packs/day: 1.00    Years: 35.00    Pack years: 35.00    Types: Cigarettes    Start date: 04/12/1942    Quit date: 04/13/1975    Years since quitting: 45.1  . Smokeless tobacco: Never Used  Vaping Use  . Vaping Use: Never used  Substance Use Topics  . Alcohol use: Not Currently    Alcohol/week: 7.0 standard drinks    Types: 7 Cans of beer per week  . Drug use: No    Home Medications Prior to Admission medications   Medication Sig Start Date End Date Taking? Authorizing Provider  acetaminophen (TYLENOL) 500 MG tablet Take 1,000 mg by mouth every 6 (six) hours as needed for moderate pain or headache.    [provider]  acyclovir (ZOVIRAX) 400 MG tablet Take 400 mg by mouth 5 (five) times daily.    [provider]  albuterol (PROVENTIL) (2.5 MG/3ML) 0.083% nebulizer solution Take 2.5 mg by nebulization every 6 (six) hours as needed for wheezing or shortness of breath.    [provider]  albuterol (VENTOLIN HFA) 108 (90 Base) MCG/ACT inhaler Inhale 2 puffs into the lungs every 6 (six) hours as needed for wheezing or shortness of breath.    [provider]  carvedilol (COREG) 6.25 MG tablet TAKE  (1)  TABLET TWICE A DAY. Patient taking differently: Take 6.25 mg by mouth 2 (two) times daily. 12/25/19   Dettinger, Fransisca Kaufmann, MD  donepezil (ARICEPT) 10 MG tablet TAKE 1 TABLET AT BEDTIME 05/01/20   Dettinger, Fransisca Kaufmann, MD  ELIQUIS 2.5 MG TABS tablet TAKE  (1)  TABLET TWICE A DAY. 04/09/20   Dettinger, Fransisca Kaufmann, MD  febuxostat (ULORIC) 40 MG tablet TAKE 1 TABLET DAILY 05/16/20   Dettinger, Fransisca Kaufmann, MD   fluticasone (FLONASE) 50 MCG/ACT nasal spray Place 1 spray into both nostrils 2 (two) times daily as needed for allergies or rhinitis. SPRAY 1 SPRAY IN EACH NOSTRIL ONCE DAILY. 04/17/20   Dettinger, Fransisca Kaufmann, MD  furosemide (LASIX) 20 MG tablet TAKE (1) TABLET DAILY IN THE MORNING. 05/01/20   Dettinger, Fransisca Kaufmann, MD  gabapentin (NEURONTIN) 400 MG capsule Take 1 capsule (400 mg total) by mouth 2 (two) times daily. 02/08/20   Dettinger, Vonna Kotyk  A, MD  Ipratropium-Albuterol (COMBIVENT RESPIMAT) 20-100 MCG/ACT AERS respimat Inhale 1 puff into the lungs every 6 (six) hours as needed for wheezing or shortness of breath (As needed for wheezing , cough or shortness of breath). Patient taking differently: Inhale 2 puffs into the lungs daily. 07/09/19   Roxan Hockey, MD  MYRBETRIQ 25 MG TB24 tablet Take 1 tablet (25 mg total) by mouth daily. 03/17/20   McKenzie, Candee Furbish, MD  nystatin (MYCOSTATIN/NYSTOP) powder Apply 1 application topically 3 (three) times daily.    [provider]  pantoprazole (PROTONIX) 40 MG tablet TAKE 1 TABLET DAILY 03/03/20   Dettinger, Fransisca Kaufmann, MD  predniSONE (DELTASONE) 10 MG tablet Take 10 mg by mouth as directed. Take 4 po qd x 2d then 3 po qd x 2d then 2 po qd x 2d then 1 po qd x 2d then stop    [provider]  sertraline (ZOLOFT) 50 MG tablet TAKE (1) TABLET DAILY IN THE MORNING. 05/16/20   Dettinger, Fransisca Kaufmann, MD  simvastatin (ZOCOR) 10 MG tablet TAKE 1 TABLET ONCE DAILY IN THE EVENING Patient taking differently: Take 10 mg by mouth every evening. 01/08/20   Dettinger, Fransisca Kaufmann, MD    Allergies    Atorvastatin and Rocephin [ceftriaxone sodium in dextrose]  Review of Systems   Review of Systems  Constitutional: Negative for chills and fever.  HENT: Negative for sore throat.   Eyes: Negative for redness.  Respiratory: Positive for cough. Negative for shortness of breath.   Cardiovascular: Negative for chest pain.  Gastrointestinal: Negative for abdominal  pain, diarrhea and vomiting.  Genitourinary: Negative for dysuria and flank pain.  Musculoskeletal: Negative for back pain and neck pain.  Skin: Negative for rash.  Neurological: Negative for headaches.  Hematological: Does not bruise/bleed easily.  Psychiatric/Behavioral: Negative for confusion.    Physical Exam Updated Vital Signs BP (!) 174/89 (BP Location: Left Arm)   Pulse 71   Temp 97.9 F (36.6 C) (Oral)   Resp (!) 25   Ht 1.676 m (5\' 6" )   Wt 68.5 kg   SpO2 97%   BMI 24.37 kg/m   Physical Exam Vitals and nursing note reviewed.  Constitutional:      Appearance: Normal appearance. He is well-developed.  HENT:     Head: Atraumatic.     Nose: Nose normal.     Mouth/Throat:     Mouth: Mucous membranes are moist.     Pharynx: Oropharynx is clear.  Eyes:     General: No scleral icterus.    Conjunctiva/sclera: Conjunctivae normal.     Pupils: Pupils are equal, round, and reactive to light.  Neck:     Vascular: No carotid bruit.     Trachea: No tracheal deviation.     Comments: No stiffness or rigidity.  Cardiovascular:     Rate and Rhythm: Normal rate and regular rhythm.     Pulses: Normal pulses.     Heart sounds: Normal heart sounds. No murmur heard. No friction rub. No gallop.   Pulmonary:     Effort: Pulmonary effort is normal. No accessory muscle usage or respiratory distress.     Breath sounds: Normal breath sounds.  Abdominal:     General: Bowel sounds are normal. There is no distension.     Palpations: Abdomen is soft.     Tenderness: There is no abdominal tenderness. There is no guarding.  Genitourinary:    Comments: No cva tenderness. Musculoskeletal:  General: No tenderness.     Cervical back: Normal range of motion and neck supple. No rigidity.     Comments: Mild right ankle foot/ankle swelling. Left aka.   Skin:    General: Skin is warm and dry.     Findings: No rash.  Neurological:     Mental Status: He is alert.     Comments: Alert,  speech clear. Motor/sens grossly intact bil.   Psychiatric:        Mood and Affect: Mood normal.     ED Results / Procedures / Treatments   Labs (all labs ordered are listed, but only abnormal results are displayed) Results for orders placed or performed during the hospital encounter of 05/18/20  SARS Coronavirus 2 by RT PCR (hospital order, performed in [facility] hospital lab) Nasopharyngeal Nasopharyngeal Swab   Specimen: Nasopharyngeal Swab  Result Value Ref Range   SARS Coronavirus 2 NEGATIVE NEGATIVE  CBC  Result Value Ref Range   WBC 3.6 (L) 4.0 - 10.5 K/uL   RBC 4.57 4.22 - 5.81 MIL/uL   Hemoglobin 12.1 (L) 13.0 - 17.0 g/dL   HCT 40.5 39.0 - 52.0 %   MCV 88.6 80.0 - 100.0 fL   MCH 26.5 26.0 - 34.0 pg   MCHC 29.9 (L) 30.0 - 36.0 g/dL   RDW 17.1 (H) 11.5 - 15.5 %   Platelets 206 150 - 400 K/uL   nRBC 0.0 0.0 - 0.2 %  Basic metabolic panel  Result Value Ref Range   Sodium 138 135 - 145 mmol/L   Potassium 3.8 3.5 - 5.1 mmol/L   Chloride 100 98 - 111 mmol/L   CO2 31 22 - 32 mmol/L   Glucose, Bld 96 70 - 99 mg/dL   BUN 21 8 - 23 mg/dL   Creatinine, Ser 1.04 0.61 - 1.24 mg/dL   Calcium 8.9 8.9 - 10.3 mg/dL   GFR, Estimated >60 >60 mL/min   Anion gap 7 5 - 15  Urinalysis, Routine w reflex microscopic Urine, Clean Catch  Result Value Ref Range   Color, Urine STRAW (A) YELLOW   APPearance CLEAR CLEAR   Specific Gravity, Urine 1.009 1.005 - 1.030   pH 5.0 5.0 - 8.0   Glucose, UA NEGATIVE NEGATIVE mg/dL   Hgb urine dipstick LARGE (A) NEGATIVE   Bilirubin Urine NEGATIVE NEGATIVE   Ketones, ur NEGATIVE NEGATIVE mg/dL   Protein, ur NEGATIVE NEGATIVE mg/dL   Nitrite NEGATIVE NEGATIVE   Leukocytes,Ua NEGATIVE NEGATIVE   RBC / HPF 21-50 0 - 5 RBC/hpf   WBC, UA 0-5 0 - 5 WBC/hpf   Bacteria, UA NONE SEEN NONE SEEN   Squamous Epithelial / LPF 0-5 0 - 5   Mucus PRESENT    Hyaline Casts, UA PRESENT   Brain natriuretic peptide  Result Value Ref Range   B Natriuretic  Peptide 1,846.0 (H) 0.0 - 100.0 pg/mL   *Note: Due to a large number of results and/or encounters for the requested time period, some results have not been displayed. A complete set of results can be found in Results Review.   CT HEAD WO CONTRAST  Result Date: 05/18/2020 CLINICAL DATA:  Altered mental status EXAM: CT HEAD WITHOUT CONTRAST TECHNIQUE: Contiguous axial images were obtained from the base of the skull through the vertex without intravenous contrast. COMPARISON:  Head CT February 17, 2020 FINDINGS: Brain: Similar global parenchymal volume loss with ex vacuo dilatation of the ventricular system. Similar burden of chronic small vessel white matter  disease. Remote infarct of the left basal ganglia and periventricular white matter appears unchanged. No acute intracranial hemorrhage or large vascular territory infarction. No extra-axial fluid collections. Vascular: No hyperdense vessel. Atherosclerotic calcifications of the internal carotid and vertebral arteries. Skull: Unremarkable Sinuses/Orbits: No acute finding. Other: None. IMPRESSION: 1. No acute intracranial abnormality. 2. Similar burden of chronic small vessel white matter disease and and parenchymal volume loss. Unchanged remote left basal ganglia and periventricular white matter infarct. Electronically Signed   By: Dahlia Bailiff MD   On: 05/18/2020 18:23    EKG EKG Interpretation  Date/Time:  Sunday May 18 2020 18:18:05 EST Ventricular Rate:  74 PR Interval:    QRS Duration: 154 QT Interval:  436 QTC Calculation: 484 R Axis:   176 Text Interpretation: Sinus rhythm Ventricular premature complex Prolonged PR interval Left bundle branch block Non-specific ST-t changes Baseline wander Confirmed by Lajean Saver [id number]) on 05/18/2020 6:21:27 PM   Radiology CT HEAD WO CONTRAST  Result Date: 05/18/2020 CLINICAL DATA:  Altered mental status EXAM: CT HEAD WITHOUT CONTRAST TECHNIQUE: Contiguous axial images were obtained from the  base of the skull through the vertex without intravenous contrast. COMPARISON:  Head CT February 17, 2020 FINDINGS: Brain: Similar global parenchymal volume loss with ex vacuo dilatation of the ventricular system. Similar burden of chronic small vessel white matter disease. Remote infarct of the left basal ganglia and periventricular white matter appears unchanged. No acute intracranial hemorrhage or large vascular territory infarction. No extra-axial fluid collections. Vascular: No hyperdense vessel. Atherosclerotic calcifications of the internal carotid and vertebral arteries. Skull: Unremarkable Sinuses/Orbits: No acute finding. Other: None. IMPRESSION: 1. No acute intracranial abnormality. 2. Similar burden of chronic small vessel white matter disease and and parenchymal volume loss. Unchanged remote left basal ganglia and periventricular white matter infarct. Electronically Signed   By: Dahlia Bailiff MD   On: 05/18/2020 18:23   DG Chest Port 1 View  Result Date: 05/18/2020 CLINICAL DATA:  Weakness, lower extremity edema EXAM: PORTABLE CHEST 1 VIEW COMPARISON:  03/05/2020 FINDINGS: Single frontal view of the chest demonstrates an enlarged cardiac silhouette. There is increased central vascular congestion without focal consolidation, effusion, or pneumothorax. No acute bony abnormalities. IMPRESSION: 1. Central vascular congestion without overt edema. Electronically Signed   By: Randa Ngo M.D.   On: 05/18/2020 21:36    Procedures Procedures   Medications Ordered in ED Medications - No data to display  ED Course  I have reviewed the triage vital signs and the nursing notes.  Pertinent labs & imaging results that were available during my care of the patient were reviewed by me and considered in my medical decision making (see chart for details).    MDM Rules/Calculators/A&P                         Iv ns.  Labs sent. Continuous pulse ox and cardiac monitoring.   Family present - request  bnp be added to labs - added.   Reviewed nursing notes and prior charts for additional history.   Labs reviewed/interpreted by me - chem normal.   CT reviewed/interpreted by me - no hem.   CXR reviewed/interpreted by me - no pna.  ?mild vascular congestion.   Additional labs reviewed/interpreted by me - bnp elevated - is noted to be chronically high, but above baseline today. Lasix iv.   Recheck pt, no increased wob. Alert, content.  Pulse ox 99% room air. No pain or discomfort.  Afebrile. covid negative. ua w 0 wbc (some microscopic hematuria, c/w pts history bladder ca).   Pt currently appears stable for d/c.   Rec pcp f/u.  Return precautions provided.      Final Clinical Impression(s) / ED Diagnoses Final diagnoses:  None    Rx / DC Orders ED Discharge Orders    None       Lajean Saver, MD 05/18/20 2155

## 2020-05-18 NOTE — ED Notes (Signed)
Urinal provided to patient and his aid. Home aid will let nursing staff know when he has urinated.

## 2020-05-18 NOTE — Discharge Instructions (Signed)
It was our pleasure to provide your ER care today - we hope that you feel better.  Take one extra of your lasix tablets tomorrow and the next day. Follow up with your doctor in the next 2-3 days - call office tomorrow AM to arrange follow up.  Also have your blood pressure rechecked then, as it is elevated today.   Return to ER if worse, new symptoms, fevers, new or severe pain, chest pain, increased trouble breathing, or other concern.

## 2020-05-19 ENCOUNTER — Telehealth: Payer: Self-pay

## 2020-05-19 NOTE — Telephone Encounter (Signed)
Transition Care Management Unsuccessful Follow-up Telephone Call  Date of discharge and from where:  05/18/20 from [facility]  Attempts:  1st Attempt  Reason for unsuccessful TCM follow-up call:  Unable to reach patient

## 2020-05-20 ENCOUNTER — Other Ambulatory Visit: Payer: Self-pay

## 2020-05-20 ENCOUNTER — Ambulatory Visit (INDEPENDENT_AMBULATORY_CARE_PROVIDER_SITE_OTHER): Payer: Medicare Other

## 2020-05-20 DIAGNOSIS — C678 Malignant neoplasm of overlapping sites of bladder: Secondary | ICD-10-CM | POA: Diagnosis not present

## 2020-05-20 LAB — URINALYSIS, ROUTINE W REFLEX MICROSCOPIC
Bilirubin, UA: NEGATIVE
Glucose, UA: NEGATIVE
Ketones, UA: NEGATIVE
Leukocytes,UA: NEGATIVE
Nitrite, UA: NEGATIVE
Specific Gravity, UA: 1.015 (ref 1.005–1.030)
Urobilinogen, Ur: 4 mg/dL — ABNORMAL HIGH (ref 0.2–1.0)
pH, UA: 7 (ref 5.0–7.5)

## 2020-05-20 LAB — MICROSCOPIC EXAMINATION
Bacteria, UA: NONE SEEN
Epithelial Cells (non renal): NONE SEEN /hpf (ref 0–10)
RBC, Urine: >30 /hpf — AB (ref 0–2)
Renal Epithel, UA: NONE SEEN /hpf
WBC, UA: NONE SEEN /hpf (ref 0–5)

## 2020-05-20 MED ORDER — BCG LIVE 50 MG IS SUSR
3.2400 mL | Freq: Once | INTRAVESICAL | Status: AC
  Administered 2020-05-20: 81 mg via INTRAVESICAL

## 2020-05-20 NOTE — Progress Notes (Addendum)
BCG Bladder Instillation  BCG # 4 of 6  Due to Bladder Cancer patient is present today for a BCG treatment. Patient was cleaned and prepped in a sterile fashion with betadine. A 16 FR catheter was inserted, urine return was noted 78ml, urine was yyellow in color.  87ml of reconstituted BCG was instilled into the bladder.Patient was given 1/2 dose. The catheter was then capped for 2 hrs then a condom catheter was applied to patient.Patient tolerated well, no complications were noted. Performed by: Delainey Winstanley, lpn  Follow up/ Additional notes: Keep next scheduled NV.

## 2020-05-20 NOTE — Patient Instructions (Signed)
Patient Education: (BCG) Into the Bladder (Intravesical Chemotherapy)  BCG is a vaccine which is used to prevent tuberculosis (TB).  But it's also a helpful treatment for some early bladder cancers.  When BCG goes directly into the bladder the treatment is described as intravesical.  BCG is a type of immunotherapy.  Immunotherapy stimulates the body's immune system to destroy cancer cells.  How it's given BCG treatment is given to you in an outpatient setting.  It takes a few minutes to administer and you can go home as soon as it's finished.  It might be a good idea to ask someone to bring you, particularly the fist time.  Unlike chemotherapy into the bladder, BCG treatment is never given immediately after surgery to remove bladder tumors.  There needs to be a delay usually of at least two weeks after surgery, before you can have it.  You won't be given treatment with BCG if you are unwell or have an infection in your urine.  You're usually asked to limit the amount you drink before your treatment.  This will help to increase the concentration of BCG in your bladder.  Drinking too much before your treatment may make your bladder feel uncomfortably full.  If you normally take water tablets (diuretics) take them later in the day after your treatment.  Your nurse or doctor will give you more advise about preparing for your treatment.  You will have a small tube (catheter) placed into your bladder.  Your doctor will then put the liquid vaccine directly into your bladder through the catheter and remove the catheter.  You will need to hold your urine for two hours afterwards.  This can be difficult but it's to give the treatment time to work.  You can walk around during this time.  When the treatment is over you can go to the toilet.  After your treatment there are some precautions you'll need to take.  This is because BCG is a live vaccine and other people shouldn't be exposed to it.  For the next six  hours, you'll need to avoid your urine splashing on the toilet seat and getting any urine on your hands.  It might be easer for men to sit down when they're using an ordinary toilet although using a stand up urinal should be alright.  The main this is to avoid splashing urine and spreading the vaccine.  You will also be asked to put 1/2 cup undiluted bleach into the toilet to destroy any live vaccine and leave it for 15 minutes until you flush.  Side Effects Because BCG goes directly into the bladder most of the side effects are linked with the bladder.  They usually go away within one to two days after your treatment.  The most common ones are: -needing to pass urine often -pain when you pass urine -blood in urine -flu-like symptoms (tiredness, general aching and raised temperature)  Theses side effects should settle down within a day or two.  If they don't get better contact your doctor.  Drinking lots of fluids can help flush the drug out of your bladder and reduce some of these effects.  Taking Ibuprofen or Aleve is encouraged unless you have a condition that would make these medications unsafe to take (renal failure, diabetes, gerd)  Rare side effects can include a continuing high temperature (fever), pain in your joints and a cough.  If you have any of these symptoms, or if you feel generally unwell, contact your   doctor.  These symptoms could be a sign of a more serious infection (due to BCG) that needs to be treated immediately.  If this happens you'll be treated with the same drugs (antibiotics) that are used to treat TB.  Contraception Men should use a condom during sex for the first 48 hours after their treatment.  If you are a women who has had BCG treatment then your partner should use a condom.  Using a condom will protect your partner from any vaccine present in your semen or vaginal fluid.  We don't know how BCG may affect a developing fetus so it's not advisable to become pregnant or  father a child while having it.  It is important to use effective contraception during your treatment and for six weeks afterwards.  You can discuss this with your doctor or specialist nurse.   

## 2020-05-20 NOTE — Telephone Encounter (Signed)
Transition Care Management Unsuccessful Follow-up Telephone Call  Date of discharge and from where:  05/18/20 from [facility]  Attempts:  2nd Attempt  Reason for unsuccessful TCM follow-up call:  Left voice message

## 2020-05-21 ENCOUNTER — Ambulatory Visit: Payer: BC Managed Care – PPO

## 2020-05-21 NOTE — Telephone Encounter (Signed)
Transition Care Management Unsuccessful Follow-up Telephone Call  Date of discharge and from where:   05/18/20 from [facility]  Attempts:  3rd Attempt  Reason for unsuccessful TCM follow-up call:  Unable to reach patient

## 2020-05-27 ENCOUNTER — Other Ambulatory Visit: Payer: Self-pay

## 2020-05-27 ENCOUNTER — Ambulatory Visit (INDEPENDENT_AMBULATORY_CARE_PROVIDER_SITE_OTHER): Payer: Medicare Other

## 2020-05-27 DIAGNOSIS — C678 Malignant neoplasm of overlapping sites of bladder: Secondary | ICD-10-CM

## 2020-05-27 LAB — MICROSCOPIC EXAMINATION
Bacteria, UA: NONE SEEN
Epithelial Cells (non renal): NONE SEEN /hpf (ref 0–10)
RBC, Urine: >30 /hpf — AB (ref 0–2)
Renal Epithel, UA: NONE SEEN /hpf
WBC, UA: NONE SEEN /hpf (ref 0–5)

## 2020-05-27 LAB — URINALYSIS, ROUTINE W REFLEX MICROSCOPIC
Bilirubin, UA: NEGATIVE
Glucose, UA: NEGATIVE
Ketones, UA: NEGATIVE
Leukocytes,UA: NEGATIVE
Nitrite, UA: NEGATIVE
Specific Gravity, UA: 1.015 (ref 1.005–1.030)
Urobilinogen, Ur: 2 mg/dL — ABNORMAL HIGH (ref 0.2–1.0)
pH, UA: 7 (ref 5.0–7.5)

## 2020-05-27 MED ORDER — BCG LIVE 50 MG IS SUSR
3.2400 mL | Freq: Once | INTRAVESICAL | Status: AC
  Administered 2020-05-27: 40.5 mg via INTRAVESICAL

## 2020-05-27 NOTE — Patient Instructions (Signed)
Patient Education: (BCG) Into the Bladder (Intravesical Chemotherapy)  BCG is a vaccine which is used to prevent tuberculosis (TB).  But it's also a helpful treatment for some early bladder cancers.  When BCG goes directly into the bladder the treatment is described as intravesical.  BCG is a type of immunotherapy.  Immunotherapy stimulates the body's immune system to destroy cancer cells.  How it's given BCG treatment is given to you in an outpatient setting.  It takes a few minutes to administer and you can go home as soon as it's finished.  It might be a good idea to ask someone to bring you, particularly the fist time.  Unlike chemotherapy into the bladder, BCG treatment is never given immediately after surgery to remove bladder tumors.  There needs to be a delay usually of at least two weeks after surgery, before you can have it.  You won't be given treatment with BCG if you are unwell or have an infection in your urine.  You're usually asked to limit the amount you drink before your treatment.  This will help to increase the concentration of BCG in your bladder.  Drinking too much before your treatment may make your bladder feel uncomfortably full.  If you normally take water tablets (diuretics) take them later in the day after your treatment.  Your nurse or doctor will give you more advise about preparing for your treatment.  You will have a small tube (catheter) placed into your bladder.  Your doctor will then put the liquid vaccine directly into your bladder through the catheter and remove the catheter.  You will need to hold your urine for two hours afterwards.  This can be difficult but it's to give the treatment time to work.  You can walk around during this time.  When the treatment is over you can go to the toilet.  After your treatment there are some precautions you'll need to take.  This is because BCG is a live vaccine and other people shouldn't be exposed to it.  For the next six  hours, you'll need to avoid your urine splashing on the toilet seat and getting any urine on your hands.  It might be easer for men to sit down when they're using an ordinary toilet although using a stand up urinal should be alright.  The main this is to avoid splashing urine and spreading the vaccine.  You will also be asked to put 1/2 cup undiluted bleach into the toilet to destroy any live vaccine and leave it for 15 minutes until you flush.  Side Effects Because BCG goes directly into the bladder most of the side effects are linked with the bladder.  They usually go away within one to two days after your treatment.  The most common ones are: -needing to pass urine often -pain when you pass urine -blood in urine -flu-like symptoms (tiredness, general aching and raised temperature)  Theses side effects should settle down within a day or two.  If they don't get better contact your doctor.  Drinking lots of fluids can help flush the drug out of your bladder and reduce some of these effects.  Taking Ibuprofen or Aleve is encouraged unless you have a condition that would make these medications unsafe to take (renal failure, diabetes, gerd)  Rare side effects can include a continuing high temperature (fever), pain in your joints and a cough.  If you have any of these symptoms, or if you feel generally unwell, contact your   doctor.  These symptoms could be a sign of a more serious infection (due to BCG) that needs to be treated immediately.  If this happens you'll be treated with the same drugs (antibiotics) that are used to treat TB.  Contraception Men should use a condom during sex for the first 48 hours after their treatment.  If you are a women who has had BCG treatment then your partner should use a condom.  Using a condom will protect your partner from any vaccine present in your semen or vaginal fluid.  We don't know how BCG may affect a developing fetus so it's not advisable to become pregnant or  father a child while having it.  It is important to use effective contraception during your treatment and for six weeks afterwards.  You can discuss this with your doctor or specialist nurse.   

## 2020-05-27 NOTE — Progress Notes (Signed)
BCG Bladder Instillation  BCG # 5 of 6  Due to Bladder Cancer patient is present today for a BCG treatment. Patient was cleaned and prepped in a sterile fashion with betadine. A 16 FR catheter was inserted, urine return was noted 61ml, urine was yellow in color. 31ml of reconstituted BCG was instilled into the bladder. Patient given 1/2 dose. The catheter was then capped for 2 hrs then a condom catheter was applied to patient.Patient tolerated well, no complications were noted.  Performed by: Vayda Dungee, lpn  Follow up/ Additional notes: Keep next scheduled NV.

## 2020-05-30 ENCOUNTER — Other Ambulatory Visit: Payer: Self-pay | Admitting: Family Medicine

## 2020-05-31 ENCOUNTER — Other Ambulatory Visit: Payer: Self-pay | Admitting: Family

## 2020-06-01 NOTE — Progress Notes (Signed)
Cardiology Office Note   Date:  06/03/2020   ID:  Scott [name], DOB 12/28/1921, MRN [id number]  PCP:  Dettinger, Fransisca Kaufmann, MD  Cardiologist:   Minus Breeding, MD   Chief Complaint  Patient presents with  . Shortness of Breath      History of Present Illness: Scott [name] is a 87 y.o. male who presents for follow up of a reduced EF, PAF and left AKA.  .    Echocardiogram obtained in 2014 showed EF 20 to 25%.  Previous cardiac catheterization in March 2014 showed patent coronary arteries.  Repeat echocardiogram by 2016 showed EF improved to 45%.  Echocardiogram obtained in August 2020 showed EF 30 to 40%, akinesis of the left ventricular apical anteroseptal wall, akinesis of the left ventricle basal mid inferior wall, RVSP 70 mmHg.  Repeat echocardiogram in March 2021 showed EF 35 to 40% RVSP 56.6 mmHg, mild MR, akinesis of basal inferior segment and hypokinesis of the mid inferior segment.  Echocardiogram obtained during an admission on 9/10/202  showed EF 35%, RVSP 25.1 mmHg, mild to moderately dilated left atrium, mild MR.   He was seen in Nov virtually prior to bladder tumor resection. He is having ongoing therapy.  His son came in today and he was with his caregiver.  The patient gets around in a wheelchair but can transfer to the bed.  He is status post left AKA.  He minimizes all of his symptoms as World War II veterans are prone to do.  His son when he came in sent to me he gets short of breath and he illustrates it by "grunting."  He doesn't have edema.  He can't do his weights daily.  They will check his oxygen level and it will be below 90 and they put his as needed oxygen on him.  I do see that he was in the emergency room with profound weakness in early February.  I reviewed these records.  He had some evidence of volume overload with a slightly elevated BNP and some edema on chest x-ray.  He was given a dose of IV Lasix.  He is not having any PND or orthopnea.  He denies any  pain.   Past Medical History:  Diagnosis Date  . Arthritis    "all over"  . Atrial fibrillation ([place])   . Benign localized prostatic hyperplasia with lower urinary tract symptoms (LUTS)   . Bradycardia 11/28/2015   Severe-HR in the 20s to 30s following intubation for urological procedure.  Marland Kitchen CAD- non obstructive disease by cath 3/14 cardiologist-  dr Vanda Waskey  . Chronic lower back pain   . COPD (chronic obstructive pulmonary disease) ([place])   . DDD (degenerative disc disease)   . Depression   . Dyspnea    w/ exertion and lying down (raise head)  . ETOH abuse   . GERD (gastroesophageal reflux disease)   . Glaucoma   . Glaucoma, both eyes   . History of bladder cancer urologist-- dr Jeffie Pollock   first dx 2012--  s/p TURBT's  . History of DVT of lower extremity 03/2013   bilateral   . History of pulmonary embolus (PE) 04/2013  . HTN (hypertension)   . Hyperlipidemia   . Hypertension   . LBBB (left bundle branch block)    chronic  . Non-ischemic cardiomyopathy ([place])    last echo 01-18-2017, ef 35-40%  . NSVT (nonsustained ventricular tachycardia) ([facility])    a. NSVT 06/2012; NSVT also seen  during 03/2013 adm. b. Med rx. Not candidate for ICD given adv age.  Marland Kitchen PAD (peripheral artery disease) ([place])   . 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 PVCs (premature ventricular contractions)   . PVD (peripheral vascular disease) ([facility])    Right ABI .75, Left .78 (2006)  . Syncope 06/2012   a. Felt to be postural syncope related to diuretics 06/2012.  Marland Kitchen Systolic and diastolic CHF, chronic ([facility]CC) cardiologsit-  dr Tyra Michelle   a. NICM - patent cors 06/2012, EF 40% at that time. b. 03/2013 eval: EF 20-25%.  . Vision loss, left eye    due to glaucoma  . Wears glasses     Past Surgical History:  Procedure Laterality Date  . AMPUTATION Left 10/16/2014   Procedure: AMPUTATION ABOVE KNEE- LEFT;  Surgeon: Mal Misty, MD;  Location: [place];  Service: Vascular;  Laterality: Left;  . CARDIAC CATHETERIZATION  05-11-2004  DR [facility]   MINIMAL CORANARY PLAQUE/ NORMAL LVF/ EF 55%/  NON-OBSTRUCTIVE LAD 25%  . CARDIAC CATHETERIZATION  06/18/2012   dr Sabian Kuba   mild luminal irregularities of coronaries, ef 45-50%  . CARDIOVASCULAR STRESS TEST  10-15-2010   LOW RISK NUCLEAR STUDY/ NO EVIDENCE OF ISCHEMIA/ NORMAL EF  . CATARACT EXTRACTION W/ INTRAOCULAR LENS  IMPLANT, BILATERAL Bilateral   . CYSTOSCOPY  10/07/2011   Procedure: CYSTOSCOPY;  Surgeon: Malka So, MD;  Location: [facility];  Service: Urology;  Laterality: N/A;  . CYSTOSCOPY N/A 10/20/2018   Procedure: CYSTOSCOPY WITH FULGURATION OF PROSTATIC URETHRAL TUMOR;  Surgeon: [name], MD;  Location: [facility];  Service: Urology;  Laterality: N/A;  . CYSTOSCOPY N/A 10/11/2019   Procedure: CYSTOSCOPY;  Surgeon: Cleon Gustin, MD;  Location: [facility];  Service: Urology;  Laterality: N/A;  . CYSTOSCOPY W/ RETROGRADES Bilateral 03/04/2020   Procedure: CYSTOSCOPY WITH BILATERAL RETROGRADE;  Surgeon: [name], MD;  Location: [facility];  Service: Urology;  Laterality: Bilateral;  . CYSTOSCOPY WITH BIOPSY  03/14/2012   Procedure: CYSTOSCOPY WITH BIOPSY;  Surgeon: Malka So, MD;  Location: [facility];  Service: Urology;  Laterality: N/A;  WITH FULGURATION  . CYSTOSCOPY WITH BIOPSY N/A 12/09/2016   Procedure: CYSTOSCOPY WITH BIOPSY AND FULGURATION;  Surgeon: [name], MD;  Location[facility];  Service: Urology;  Laterality: N/A;  . CYSTOSCOPY WITH BIOPSY N/A 12/20/2017   Procedure: CYSTOSCOPY WITH BIOPSY WITH FULGURATION;  Surgeon: [name], MD;  [facility];  Service: Urology;  Laterality: N/A;  . CYSTOSCOPY WITH BIOPSY N/A 06/09/2018   Procedure: CYSTOSCOPY WITH BIOPSY AND FULGURATION;  Surgeon: [name], MD;  Location[facility];  Service: Urology;  Laterality: N/A;  . CYSTOSCOPY WITH FULGERATION N/A 01/20/2016   Procedure: CYSTOSCOPY,  BIOPSY WITH FULGERATION OF URTHRAL TUMOR;  Surgeon: [name], MD;  [facility];  Service: Urology;  Laterality: N/A;  . CYSTOSCOPY WITH FULGERATION N/A 06/01/2016   Procedure: CYSTOSCOPY WITH FULGERATION urethral tumors and bladder neck;  Surgeon: [name], MD;  Location[facility];  Service: Urology;  Laterality: N/A;  . SHOULDER SURGERY Left 1970's  . TONSILLECTOMY    . TRANSTHORACIC ECHOCARDIOGRAM  01-18-2017   dr Chassie Pennix   moderate LVH, ef 35-40%, diffuse hypokinesis/  trivial AR and MR/  mild TR  . TRANSURETHRAL RESECTION OF BLADDER TUMOR  10/07/2011   Procedure: TRANSURETHRAL RESECTION OF BLADDER TUMOR (TURBT);  Surgeon: Malka So, MD;  Location: [place];  Service: Urology;  Laterality: N/A;  . TRANSURETHRAL RESECTION OF BLADDER TUMOR N/A 07/10/2015   Procedure: TRANSURETHRAL RESECTION OF BLADDER TUMOR (TURBT), CYSTOSCOPY ;  Surgeon: [name], MD;  [facility];  Service: Urology;  Laterality: N/A;  . TRANSURETHRAL RESECTION OF BLADDER TUMOR N/A 10/11/2019   Procedure: TRANSURETHRAL RESECTION OF PROSTATIC URETHRAL TUMOR;  Surgeon: Cleon Gustin, MD;  Location[facility];  Service: Urology;  Laterality: N/A;  . TRANSURETHRAL RESECTION OF BLADDER TUMOR WITH MITOMYCIN-C N/A 03/04/2020   Procedure: TRANSURETHRAL RESECTION OF BLADDER TUMOR WITH GEMCITABINE;  Surgeon: [name], MD;  [facility];  Service: Urology;  Laterality: N/A;  . TRANSURETHRAL RESECTION OF PROSTATE  03/14/2012   Procedure: TRANSURETHRAL RESECTION OF THE PROSTATE WITH GYRUS INSTRUMENTS;  Surgeon: Malka So, MD;  Location[facility];  Service: Urology;  Laterality: N/A;     Current Outpatient Medications  Medication Sig Dispense Refill  . acetaminophen (TYLENOL) 500 MG tablet Take 1,000 mg by mouth every 6 (six) hours as needed for moderate pain or headache.    . albuterol (PROVENTIL) (2.5 MG/3ML) 0.083% nebulizer solution Take 2.5 mg by nebulization every 6 (six) hours as needed for  wheezing or shortness of breath.    Marland Kitchen albuterol (VENTOLIN HFA) 108 (90 Base) MCG/ACT inhaler Inhale 2 puffs into the lungs every 6 (six) hours as needed for wheezing or shortness of breath.    . carvedilol (COREG) 6.25 MG tablet TAKE  (1)  TABLET TWICE A DAY. (Patient taking differently: Take 6.25 mg by mouth 2 (two) times daily.) 60 tablet 5  . donepezil (ARICEPT) 10 MG tablet TAKE 1 TABLET AT BEDTIME 30 tablet 0  . ELIQUIS 2.5 MG TABS tablet TAKE  (1)  TABLET TWICE A DAY. (Patient taking differently: Take 2.5 mg by mouth 2 (two) times daily.) 60 tablet 0  . febuxostat (ULORIC) 40 MG tablet TAKE 1 TABLET DAILY 30 tablet 0  . fluticasone (FLONASE) 50 MCG/ACT nasal spray Place 1 spray into both nostrils 2 (two) times daily as needed for allergies or rhinitis. SPRAY 1 SPRAY IN EACH NOSTRIL ONCE DAILY. 16 g 2  . furosemide (LASIX) 20 MG tablet TAKE (1) TABLET DAILY IN THE MORNING. (Patient taking differently: Take 20 mg by mouth daily.) 30 tablet 0  . furosemide (LASIX) 20 MG tablet Take 1 tablet (20 mg total) by mouth daily as needed (signs of volume overload). 30 tablet 2  . gabapentin (NEURONTIN) 400 MG capsule Take 1 capsule (400 mg total) by mouth 2 (two) times daily. 60 capsule 5  . Ipratropium-Albuterol (COMBIVENT RESPIMAT) 20-100 MCG/ACT AERS respimat Inhale 1 puff into the lungs every 6 (six) hours as needed for wheezing or shortness of breath (As needed for wheezing , cough or shortness of breath). (Patient taking differently: Inhale 2 puffs into the lungs daily as needed for shortness of breath or wheezing.) 4 g 0  . MYRBETRIQ 25 MG TB24 tablet Take 1 tablet (25 mg total) by mouth daily. 90 tablet 3  . pantoprazole (PROTONIX) 40 MG tablet TAKE 1 TABLET DAILY 90 tablet 0  . sertraline (ZOLOFT) 50 MG tablet TAKE (1) TABLET DAILY IN THE MORNING. (Patient taking differently: Take 50 mg by mouth daily.) 30 tablet 0  . simvastatin (ZOCOR) 10 MG tablet TAKE 1 TABLET ONCE DAILY IN THE EVENING (Patient  taking differently: Take 10 mg by mouth every evening.) 90 tablet 1  . acyclovir (ZOVIRAX) 400 MG tablet Take 400 mg  by mouth 5 (five) times daily. (Patient not taking: No sig reported)    . predniSONE (DELTASONE) 10 MG tablet Take 10 mg by mouth as directed. Take 4 po qd x 2d then 3 po qd x 2d then 2 po qd x 2d then 1 po qd x 2d then stop (Patient not taking: No sig reported)     No current facility-administered medications for this visit.    Allergies:   Atorvastatin and Rocephin [ceftriaxone sodium in dextrose]    ROS:  Please see the history of present illness.   Otherwise, review of systems are positive for none.   All other systems are reviewed and negative.    PHYSICAL EXAM: VS:  BP 135/74   Pulse 66   Ht 5\' 6"  (1.676 m)   SpO2 95%   BMI 24.37 kg/m  , BMI Body mass index is 24.37 kg/m. GEN:  No distress NECK:  No jugular venous distention at 90 degrees, waveform within normal limits, carotid upstroke brisk and symmetric, no bruits, no thyromegaly LYMPHATICS:  No cervical adenopathy LUNGS:  Clear to auscultation bilaterally BACK:  No CVA tenderness CHEST:  Unremarkable HEART:  S1 and S2 within normal limits, no S3, no S4, no clicks, no rubs, no murmurs ABD:  Positive bowel sounds normal in frequency in pitch, no bruits, no rebound, no guarding, unable to assess midline mass or bruit with the patient seated. EXT:  2 plus pulses throughout, mild leftedema, no cyanosis no clubbing, status post left AKA SKIN:  No rashes no nodules NEURO:  Cranial nerves II through XII grossly intact, motor grossly intact throughout PSYCH:  Cognitively intact, oriented to person place and time   EKG:  EKG is ordered today. The ekg ordered today demonstrates sinus rhythm, rate 66, significant first-degree AV block, interventricular conduction delay, left axis deviation.   Recent Labs: 03/06/2020: ALT 12; Magnesium 2.1 05/18/2020: B Natriuretic Peptide 1,846.0; BUN 21; Creatinine, Ser 1.04;  Hemoglobin 12.1; Platelets 206; Potassium 3.8; Sodium 138    Lipid Panel    Component Value Date/Time   CHOL 115 12/19/2019 1028   TRIG 83 12/19/2019 1028   TRIG 194 (H) 03/26/2014 1446   HDL 42 12/19/2019 1028   HDL 35 (L) 03/26/2014 1446   CHOLHDL 2.7 12/19/2019 1028   CHOLHDL 2.6 11/02/2012 1310   VLDL 44 (H) 11/02/2012 1310   LDLCALC 57 12/19/2019 1028   LDLCALC 42 05/17/2013 1101      Wt Readings from Last 3 Encounters:  05/18/20 151 lb (68.5 kg)  03/28/20 131 lb (59.4 kg)  03/04/20 131 lb (59.4 kg)      Other studies Reviewed: Additional studies/ records that were reviewed today include:   ED records. Review of the above records demonstrates:  Please see elsewhere in the note.     ASSESSMENT AND PLAN:  Nonischemic cardiomyopathy:    We talked at length about as needed dosing of his diuretic.  I'm going to have him take an extra 20 mg of Lasix if his oxygen saturation is fallen which is heralded by him having perhaps some decreased mental status or occasionally grunting.  If they're having to do this more than a few times a week we will need to follow-up his renal function.   PAF:   He has had no symptomatic recurrence of this.  He tolerates anticoagulation.  No change in therapy.  Hypertension: His blood pressure runs sometimes on the slightly low side.  I don't think he would tolerate med  titration.  No change in therapy.   CKD IIIA: We'll need to follow this carefully.  His creatinine in February in the emergency room was normal at 1.04.   Current medicines are reviewed at length with the patient today.  The patient does not have concerns regarding medicines.  The following changes have been made:  As above  Labs/ tests ordered today include: None  Orders Placed This Encounter  Procedures  . EKG 12-Lead     Disposition:   FU with me in six months in [place], Minus Breeding, MD  06/03/2020 4:20 PM    [facility]  [facility]

## 2020-06-02 ENCOUNTER — Other Ambulatory Visit: Payer: Self-pay | Admitting: Family Medicine

## 2020-06-02 DIAGNOSIS — I5042 Chronic combined systolic (congestive) and diastolic (congestive) heart failure: Secondary | ICD-10-CM

## 2020-06-03 ENCOUNTER — Other Ambulatory Visit: Payer: Self-pay

## 2020-06-03 ENCOUNTER — Ambulatory Visit (INDEPENDENT_AMBULATORY_CARE_PROVIDER_SITE_OTHER): Payer: Medicare Other | Admitting: Cardiology

## 2020-06-03 ENCOUNTER — Encounter: Payer: Self-pay | Admitting: Cardiology

## 2020-06-03 VITALS — BP 135/74 | HR 66 | Ht 66.0 in

## 2020-06-03 DIAGNOSIS — I428 Other cardiomyopathies: Secondary | ICD-10-CM

## 2020-06-03 DIAGNOSIS — I10 Essential (primary) hypertension: Secondary | ICD-10-CM

## 2020-06-03 DIAGNOSIS — I480 Paroxysmal atrial fibrillation: Secondary | ICD-10-CM | POA: Diagnosis not present

## 2020-06-03 MED ORDER — FUROSEMIDE 20 MG PO TABS: 20.0000 mg | ORAL_TABLET | Freq: Every day | ORAL | 2 refills | Status: DC | PRN

## 2020-06-03 NOTE — Patient Instructions (Signed)
Medication Instructions:  May take an additional 20mg  of Lasix daily if needed for volume overload *If you need a refill on your cardiac medications before your next appointment, please call your pharmacy*  Follow-Up: At [facility], you and your health needs are our priority.  As part of our continuing mission to provide you with exceptional heart care, we have created designated Provider Care Teams.  These Care Teams include your primary Cardiologist (physician) and Advanced Practice Providers (APPs -  Physician Assistants and Nurse Practitioners) who all work together to provide you with the care you need, when you need it.  Your next appointment:   6 month(s) in [facility] will receive a reminder letter in the mail two months in advance. If you don't receive a letter, please call our office to schedule the follow-up appointment.  The format for your next appointment:   In Person  Provider:   Minus Breeding, MD

## 2020-06-04 ENCOUNTER — Ambulatory Visit (INDEPENDENT_AMBULATORY_CARE_PROVIDER_SITE_OTHER): Payer: Medicare Other

## 2020-06-04 DIAGNOSIS — C678 Malignant neoplasm of overlapping sites of bladder: Secondary | ICD-10-CM

## 2020-06-04 LAB — URINALYSIS, ROUTINE W REFLEX MICROSCOPIC
Bilirubin, UA: NEGATIVE
Glucose, UA: NEGATIVE
Ketones, UA: NEGATIVE
Nitrite, UA: NEGATIVE
Protein,UA: NEGATIVE
Specific Gravity, UA: 1.015 (ref 1.005–1.030)
Urobilinogen, Ur: 2 mg/dL — ABNORMAL HIGH (ref 0.2–1.0)
pH, UA: 7 (ref 5.0–7.5)

## 2020-06-04 LAB — MICROSCOPIC EXAMINATION: Renal Epithel, UA: NONE SEEN /hpf

## 2020-06-04 MED ORDER — BCG LIVE 50 MG IS SUSR
3.2400 mL | Freq: Once | INTRAVESICAL | Status: AC
Start: 2020-06-04 — End: 2020-06-04
  Administered 2020-06-04: 81 mg via INTRAVESICAL

## 2020-06-04 NOTE — Progress Notes (Signed)
BCG Bladder Instillation  BCG # 6 of 6  Due to Bladder Cancer patient is present today for a BCG treatment. Patient was cleaned and prepped in a sterile fashion with betadine. A 16FR catheter was inserted, urine return was noted 47ml, urine was yellow in color. 57ml of reconstituted BCG was instilled into the bladder.  The cath was then capped. The pt stayed here in the office until 2 hrs had past.The catheter was then removed. Patient tolerated well, no complications were noted  Preformed by: Valentina Lucks, LPN

## 2020-06-16 ENCOUNTER — Other Ambulatory Visit: Payer: Self-pay | Admitting: Family Medicine

## 2020-06-20 ENCOUNTER — Other Ambulatory Visit: Payer: Self-pay | Admitting: Family Medicine

## 2020-07-10 ENCOUNTER — Other Ambulatory Visit: Payer: Self-pay | Admitting: Family Medicine

## 2020-07-12 DIAGNOSIS — R4020 Unspecified coma: Secondary | ICD-10-CM | POA: Diagnosis not present

## 2020-07-15 ENCOUNTER — Other Ambulatory Visit: Payer: Self-pay | Admitting: Family Medicine

## 2020-07-20 DIAGNOSIS — R69 Illness, unspecified: Secondary | ICD-10-CM | POA: Diagnosis not present

## 2020-07-20 DIAGNOSIS — R5381 Other malaise: Secondary | ICD-10-CM | POA: Diagnosis not present

## 2020-07-21 ENCOUNTER — Encounter: Payer: Self-pay | Admitting: Family Medicine

## 2020-07-21 ENCOUNTER — Ambulatory Visit (INDEPENDENT_AMBULATORY_CARE_PROVIDER_SITE_OTHER): Payer: Medicare Other | Admitting: Family Medicine

## 2020-07-21 ENCOUNTER — Other Ambulatory Visit: Payer: Self-pay

## 2020-07-21 VITALS — BP 119/58 | HR 59 | Ht 66.0 in

## 2020-07-21 DIAGNOSIS — I480 Paroxysmal atrial fibrillation: Secondary | ICD-10-CM | POA: Diagnosis not present

## 2020-07-21 DIAGNOSIS — E7800 Pure hypercholesterolemia, unspecified: Secondary | ICD-10-CM | POA: Diagnosis not present

## 2020-07-21 DIAGNOSIS — I10 Essential (primary) hypertension: Secondary | ICD-10-CM | POA: Diagnosis not present

## 2020-07-21 DIAGNOSIS — G309 Alzheimer's disease, unspecified: Secondary | ICD-10-CM

## 2020-07-21 DIAGNOSIS — R300 Dysuria: Secondary | ICD-10-CM | POA: Diagnosis not present

## 2020-07-21 DIAGNOSIS — I5042 Chronic combined systolic (congestive) and diastolic (congestive) heart failure: Secondary | ICD-10-CM | POA: Diagnosis not present

## 2020-07-21 DIAGNOSIS — Z89612 Acquired absence of left leg above knee: Secondary | ICD-10-CM

## 2020-07-21 DIAGNOSIS — F0280 Dementia in other diseases classified elsewhere without behavioral disturbance: Secondary | ICD-10-CM

## 2020-07-21 LAB — URINALYSIS, COMPLETE
Bilirubin, UA: NEGATIVE
Glucose, UA: NEGATIVE
Leukocytes,UA: NEGATIVE
Nitrite, UA: NEGATIVE
Specific Gravity, UA: 1.02 (ref 1.005–1.030)
Urobilinogen, Ur: 2 mg/dL — ABNORMAL HIGH (ref 0.2–1.0)
pH, UA: 6.5 (ref 5.0–7.5)

## 2020-07-21 LAB — MICROSCOPIC EXAMINATION

## 2020-07-21 MED ORDER — DONEPEZIL HCL 10 MG PO TABS: 10.0000 mg | ORAL_TABLET | Freq: Every day | ORAL | 3 refills | Status: DC

## 2020-07-21 MED ORDER — PANTOPRAZOLE SODIUM 40 MG PO TBEC: 1.0000 | DELAYED_RELEASE_TABLET | Freq: Every day | ORAL | 3 refills | Status: DC

## 2020-07-21 MED ORDER — GABAPENTIN 400 MG PO CAPS: 400.0000 mg | ORAL_CAPSULE | Freq: Two times a day (BID) | ORAL | 5 refills | Status: DC

## 2020-07-21 MED ORDER — SERTRALINE HCL 50 MG PO TABS: 50.0000 mg | ORAL_TABLET | Freq: Every day | ORAL | 3 refills | Status: DC

## 2020-07-21 MED ORDER — SIMVASTATIN 10 MG PO TABS: 10.0000 mg | ORAL_TABLET | Freq: Every day | ORAL | 3 refills | Status: DC

## 2020-07-21 MED ORDER — FEBUXOSTAT 40 MG PO TABS: 40.0000 mg | ORAL_TABLET | Freq: Every day | ORAL | 3 refills | Status: DC

## 2020-07-21 MED ORDER — CARVEDILOL 6.25 MG PO TABS: 6.2500 mg | ORAL_TABLET | Freq: Two times a day (BID) | ORAL | 3 refills | Status: DC

## 2020-07-21 MED ORDER — APIXABAN 2.5 MG PO TABS: 2.5000 mg | ORAL_TABLET | Freq: Two times a day (BID) | ORAL | 3 refills | Status: DC

## 2020-07-21 NOTE — Progress Notes (Signed)
BP (!) 119/58   Pulse (!) 59   Ht 5' 6"  (1.676 m)   SpO2 91%   BMI 24.37 kg/m    Subjective:   Patient ID: Scott [name], male    DOB: October 21, 1921, 87 y.o.   MRN: [id number]  HPI: Scott [name] is a 87 y.o. male presenting on 07/21/2020 for Medical Management of Chronic Issues and Diabetes   HPI Type 2 diabetes mellitus Patient comes in today for recheck of his diabetes. Patient has been currently taking diet controlled. Patient is not currently on an ACE inhibitor/ARB. Patient has not seen an ophthalmologist this year. Patient denies any issues with their feet. The symptom started onset as an adult hypertension and hyperlipidemia and PAD and A. fib and CHF and CAD ARE RELATED TO DM   PAF recheck Patient is coming in for paroxysmal A. fib recheck  Patient has CHF and his breathing is gradually worsening and they have him on 2 L nasal cannula.  Family says that he is gradually worsening on his breathing and have stepped down to as low as 67% when he comes off of his oxygen at night especially.  Patient has been having less appetite and worsening memory and is generally just not doing as well and family is concerned that he may have some type of UTI, is been going on for couple months.  Hypertension Patient is currently on carvedilol and furosemide, and their blood pressure today is 119/58. Patient denies any lightheadedness or dizziness. Patient denies headaches, blurred vision, chest pains, shortness of breath, or weakness. Denies any side effects from medication and is content with current medication. '  Hyperlipidemia Patient is coming in for recheck of his hyperlipidemia. The patient is currently taking simvastatin. They deny any issues with myalgias or history of liver damage from it. They deny any focal numbness or weakness or chest pain.   Patient has a motorized wheelchair because of knee amputation, the battery shortness of any new battery in service for the  machine.  Relevant past medical, surgical, family and social history reviewed and updated as indicated. Interim medical history since our last visit reviewed. Allergies and medications reviewed and updated.  Review of Systems  Constitutional: Negative for chills and fever.  Respiratory: Positive for shortness of breath. Negative for wheezing.   Cardiovascular: Negative for chest pain, palpitations and leg swelling.  Musculoskeletal: Negative for back pain and gait problem.  Skin: Negative for rash.  Neurological: Negative for dizziness, weakness and light-headedness.  All other systems reviewed and are negative.   Per HPI unless specifically indicated above   Allergies as of 07/21/2020      Reactions   Atorvastatin Hives, Rash   Rocephin [ceftriaxone Sodium In Dextrose] Other (See Comments)   Recurrent seizures      Medication List       Accurate as of July 21, 2020  3:30 PM. If you have any questions, ask your nurse or doctor.        STOP taking these medications   predniSONE 10 MG tablet Commonly known as: DELTASONE Stopped by: Fransisca Kaufmann Abisai Coble, MD     TAKE these medications   acetaminophen 500 MG tablet Commonly known as: TYLENOL Take 1,000 mg by mouth every 6 (six) hours as needed for moderate pain or headache.   acyclovir 400 MG tablet Commonly known as: ZOVIRAX Take 400 mg by mouth 5 (five) times daily.   albuterol (2.5 MG/3ML) 0.083% nebulizer solution Commonly known as: PROVENTIL  Take 2.5 mg by nebulization every 6 (six) hours as needed for wheezing or shortness of breath.   albuterol 108 (90 Base) MCG/ACT inhaler Commonly known as: VENTOLIN HFA Inhale 2 puffs into the lungs every 6 (six) hours as needed for wheezing or shortness of breath.   apixaban 2.5 MG Tabs tablet Commonly known as: Eliquis Take 1 tablet (2.5 mg total) by mouth 2 (two) times daily. What changed: additional instructions Changed by: Fransisca Kaufmann Torunn Chancellor, MD   carvedilol 6.25 MG  tablet Commonly known as: COREG Take 1 tablet (6.25 mg total) by mouth 2 (two) times daily with a meal. What changed: See the new instructions. Changed by: Fransisca Kaufmann Norva Bowe, MD   Combivent Respimat 20-100 MCG/ACT Aers respimat Generic drug: Ipratropium-Albuterol Inhale 1 puff into the lungs every 6 (six) hours as needed for wheezing or shortness of breath (As needed for wheezing , cough or shortness of breath). What changed:   how much to take  when to take this  reasons to take this   donepezil 10 MG tablet Commonly known as: ARICEPT Take 1 tablet (10 mg total) by mouth at bedtime. What changed: additional instructions Changed by: Fransisca Kaufmann Sheridan Hew, MD   febuxostat 40 MG tablet Commonly known as: ULORIC Take 1 tablet (40 mg total) by mouth daily.   fluticasone 50 MCG/ACT nasal spray Commonly known as: FLONASE Place 1 spray into both nostrils 2 (two) times daily as needed for allergies or rhinitis. SPRAY 1 SPRAY IN EACH NOSTRIL ONCE DAILY.   furosemide 20 MG tablet Commonly known as: Lasix Take 1 tablet (20 mg total) by mouth daily as needed (signs of volume overload).   gabapentin 400 MG capsule Commonly known as: NEURONTIN Take 1 capsule (400 mg total) by mouth 2 (two) times daily.   Myrbetriq 25 MG Tb24 tablet Generic drug: mirabegron ER Take 1 tablet (25 mg total) by mouth daily.   pantoprazole 40 MG tablet Commonly known as: PROTONIX Take 1 tablet (40 mg total) by mouth daily.   sertraline 50 MG tablet Commonly known as: ZOLOFT Take 1 tablet (50 mg total) by mouth daily. What changed: additional instructions Changed by: Fransisca Kaufmann Isaiah Cianci, MD   simvastatin 10 MG tablet Commonly known as: ZOCOR Take 1 tablet (10 mg total) by mouth daily at 6 PM. What changed: additional instructions Changed by: Worthy Rancher, MD            Durable Medical Equipment  (From admission, onward)         Start     Ordered   07/21/20 0000  For home use only  DME Other see comment       Comments: Battery for motorized wheelchair Diagnosis BK A  Question:  Length of Need  Answer:  Lifetime   07/21/20 1447           Objective:   BP (!) 119/58   Pulse (!) 59   Ht 5' 6"  (1.676 m)   SpO2 91%   BMI 24.37 kg/m   Wt Readings from Last 3 Encounters:  05/18/20 151 lb (68.5 kg)  03/28/20 131 lb (59.4 kg)  03/04/20 131 lb (59.4 kg)    Physical Exam Vitals and nursing note reviewed.  Constitutional:      General: He is not in acute distress.    Appearance: He is well-developed. He is not diaphoretic.  Eyes:     General: No scleral icterus.    Conjunctiva/sclera: Conjunctivae normal.  Neck:  Thyroid: No thyromegaly.  Cardiovascular:     Rate and Rhythm: Normal rate and regular rhythm.     Heart sounds: Normal heart sounds. No murmur heard.   Pulmonary:     Effort: Pulmonary effort is normal. No respiratory distress.     Breath sounds: Normal breath sounds. No wheezing.  Musculoskeletal:     Cervical back: Neck supple.     Comments: Left amputation around the knee.  Lymphadenopathy:     Cervical: No cervical adenopathy.  Skin:    General: Skin is warm and dry.     Findings: No rash.  Neurological:     Mental Status: He is alert and oriented to person, place, and time.     Coordination: Coordination normal.  Psychiatric:        Behavior: Behavior normal.     Oxygen desaturation down to 87% on room air, patient started feeling short of breath put oxygen back on and came back up to 95% on 3 L nasal cannula  Assessment & Plan:   Problem List Items Addressed This Visit      Cardiovascular and Mediastinum   Chronic combined systolic and diastolic heart failure (HCC)   Relevant Medications   apixaban (ELIQUIS) 2.5 MG TABS tablet   carvedilol (COREG) 6.25 MG tablet   simvastatin (ZOCOR) 10 MG tablet   Essential hypertension   Relevant Medications   apixaban (ELIQUIS) 2.5 MG TABS tablet   carvedilol (COREG) 6.25 MG  tablet   simvastatin (ZOCOR) 10 MG tablet   Other Relevant Orders   CMP14+EGFR   Lipid panel   Paroxysmal atrial fibrillation (HCC)   Relevant Medications   apixaban (ELIQUIS) 2.5 MG TABS tablet   carvedilol (COREG) 6.25 MG tablet   simvastatin (ZOCOR) 10 MG tablet   Other Relevant Orders   CBC with Differential/Platelet   CMP14+EGFR   Lipid panel     Nervous and Auditory   Alzheimer disease (HCC)   Relevant Medications   donepezil (ARICEPT) 10 MG tablet   gabapentin (NEURONTIN) 400 MG capsule   sertraline (ZOLOFT) 50 MG tablet     Other   Hyperlipidemia (Chronic)   Relevant Medications   apixaban (ELIQUIS) 2.5 MG TABS tablet   carvedilol (COREG) 6.25 MG tablet   simvastatin (ZOCOR) 10 MG tablet   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 Patient needs oxygen decompressor serviced at home.  Patient needs his motorized wheelchair serviced as well, have placed order for that.  Continue current medication, encourage protein intake Follow up plan: Return in about 6 months (around 01/20/2021), or if symptoms worsen or fail to improve, for A. fib and hypertension and CHF recheck.  Counseling provided for all of the vaccine components Orders Placed This Encounter  Procedures  . For home use only DME Other see comment  . Urine Culture  . Urinalysis, Complete  . CBC with Differential/Platelet  . CMP14+EGFR  . Lipid panel    Caryl Pina, MD [facility] 07/21/2020, 3:30 PM

## 2020-07-22 LAB — CMP14+EGFR
ALT: 11 IU/L (ref 0–44)
AST: 16 IU/L (ref 0–40)
Albumin/Globulin Ratio: 1.5 (ref 1.2–2.2)
Albumin: 3.9 g/dL (ref 3.5–4.6)
Alkaline Phosphatase: 160 IU/L — ABNORMAL HIGH (ref 44–121)
BUN/Creatinine Ratio: 17 (ref 10–24)
BUN: 20 mg/dL (ref 10–36)
Bilirubin Total: 0.6 mg/dL (ref 0.0–1.2)
CO2: 29 mmol/L (ref 20–29)
Calcium: 9.4 mg/dL (ref 8.6–10.2)
Chloride: 103 mmol/L (ref 96–106)
Creatinine, Ser: 1.16 mg/dL (ref 0.76–1.27)
Globulin, Total: 2.6 g/dL (ref 1.5–4.5)
Glucose: 94 mg/dL (ref 65–99)
Potassium: 4.4 mmol/L (ref 3.5–5.2)
Sodium: 147 mmol/L — ABNORMAL HIGH (ref 134–144)
Total Protein: 6.5 g/dL (ref 6.0–8.5)
eGFR: 58 mL/min/{1.73_m2} — ABNORMAL LOW (ref >59)

## 2020-07-22 LAB — LIPID PANEL
Chol/HDL Ratio: 2.8 ratio (ref 0.0–5.0)
Cholesterol, Total: 108 mg/dL (ref 100–199)
HDL: 39 mg/dL — ABNORMAL LOW (ref >39)
LDL Chol Calc (NIH): 52 mg/dL (ref 0–99)
Triglycerides: 89 mg/dL (ref 0–149)
VLDL Cholesterol Cal: 17 mg/dL (ref 5–40)

## 2020-07-22 LAB — CBC WITH DIFFERENTIAL/PLATELET
Basophils Absolute: 0 10*3/uL (ref 0.0–0.2)
Basos: 1 %
EOS (ABSOLUTE): 0.1 10*3/uL (ref 0.0–0.4)
Eos: 3 %
Hematocrit: 40.2 % (ref 37.5–51.0)
Hemoglobin: 12.4 g/dL — ABNORMAL LOW (ref 13.0–17.7)
Immature Grans (Abs): 0 10*3/uL (ref 0.0–0.1)
Immature Granulocytes: 0 %
Lymphocytes Absolute: 0.9 10*3/uL (ref 0.7–3.1)
Lymphs: 24 %
MCH: 26.1 pg — ABNORMAL LOW (ref 26.6–33.0)
MCHC: 30.8 g/dL — ABNORMAL LOW (ref 31.5–35.7)
MCV: 85 fL (ref 79–97)
Monocytes Absolute: 0.3 10*3/uL (ref 0.1–0.9)
Monocytes: 9 %
Neutrophils Absolute: 2.4 10*3/uL (ref 1.4–7.0)
Neutrophils: 63 %
Platelets: 164 10*3/uL (ref 150–450)
RBC: 4.75 x10E6/uL (ref 4.14–5.80)
RDW: 14.8 % (ref 11.6–15.4)
WBC: 3.8 10*3/uL (ref 3.4–10.8)

## 2020-07-22 LAB — URINE CULTURE

## 2020-07-23 ENCOUNTER — Other Ambulatory Visit: Payer: Self-pay | Admitting: Urology

## 2020-07-23 DIAGNOSIS — N138 Other obstructive and reflux uropathy: Secondary | ICD-10-CM

## 2020-07-23 DIAGNOSIS — N401 Enlarged prostate with lower urinary tract symptoms: Secondary | ICD-10-CM

## 2020-08-16 ENCOUNTER — Encounter (HOSPITAL_COMMUNITY): Payer: Self-pay | Admitting: Emergency Medicine

## 2020-08-16 ENCOUNTER — Emergency Department (HOSPITAL_COMMUNITY): Payer: Medicare Other

## 2020-08-16 ENCOUNTER — Inpatient Hospital Stay (HOSPITAL_COMMUNITY)
Admission: EM | Admit: 2020-08-16 | Discharge: 2020-08-19 | DRG: 100 | Disposition: A | Discharge status: Home Health | Payer: Medicare Other | Attending: Internal Medicine | Admitting: Internal Medicine

## 2020-08-16 ENCOUNTER — Other Ambulatory Visit: Payer: Self-pay

## 2020-08-16 DIAGNOSIS — R569 Unspecified convulsions: Secondary | ICD-10-CM

## 2020-08-16 DIAGNOSIS — F0280 Dementia in other diseases classified elsewhere without behavioral disturbance: Secondary | ICD-10-CM | POA: Diagnosis present

## 2020-08-16 DIAGNOSIS — Z79899 Other long term (current) drug therapy: Secondary | ICD-10-CM

## 2020-08-16 DIAGNOSIS — I110 Hypertensive heart disease with heart failure: Secondary | ICD-10-CM | POA: Diagnosis present

## 2020-08-16 DIAGNOSIS — C672 Malignant neoplasm of lateral wall of bladder: Secondary | ICD-10-CM | POA: Diagnosis present

## 2020-08-16 DIAGNOSIS — Z89612 Acquired absence of left leg above knee: Secondary | ICD-10-CM | POA: Diagnosis present

## 2020-08-16 DIAGNOSIS — Z86711 Personal history of pulmonary embolism: Secondary | ICD-10-CM

## 2020-08-16 DIAGNOSIS — I255 Ischemic cardiomyopathy: Secondary | ICD-10-CM | POA: Diagnosis present

## 2020-08-16 DIAGNOSIS — I428 Other cardiomyopathies: Secondary | ICD-10-CM | POA: Diagnosis present

## 2020-08-16 DIAGNOSIS — G40501 Epileptic seizures related to external causes, not intractable, with status epilepticus: Secondary | ICD-10-CM | POA: Diagnosis not present

## 2020-08-16 DIAGNOSIS — I517 Cardiomegaly: Secondary | ICD-10-CM | POA: Diagnosis not present

## 2020-08-16 DIAGNOSIS — I2510 Atherosclerotic heart disease of native coronary artery without angina pectoris: Secondary | ICD-10-CM | POA: Diagnosis present

## 2020-08-16 DIAGNOSIS — I252 Old myocardial infarction: Secondary | ICD-10-CM | POA: Diagnosis not present

## 2020-08-16 DIAGNOSIS — Z66 Do not resuscitate: Secondary | ICD-10-CM | POA: Diagnosis present

## 2020-08-16 DIAGNOSIS — H409 Unspecified glaucoma: Secondary | ICD-10-CM | POA: Diagnosis present

## 2020-08-16 DIAGNOSIS — E785 Hyperlipidemia, unspecified: Secondary | ICD-10-CM | POA: Diagnosis present

## 2020-08-16 DIAGNOSIS — F32A Depression, unspecified: Secondary | ICD-10-CM | POA: Diagnosis present

## 2020-08-16 DIAGNOSIS — I82403 Acute embolism and thrombosis of unspecified deep veins of lower extremity, bilateral: Secondary | ICD-10-CM | POA: Diagnosis not present

## 2020-08-16 DIAGNOSIS — I10 Essential (primary) hypertension: Secondary | ICD-10-CM | POA: Diagnosis not present

## 2020-08-16 DIAGNOSIS — Z86718 Personal history of other venous thrombosis and embolism: Secondary | ICD-10-CM

## 2020-08-16 DIAGNOSIS — I4891 Unspecified atrial fibrillation: Secondary | ICD-10-CM | POA: Diagnosis not present

## 2020-08-16 DIAGNOSIS — G40801 Other epilepsy, not intractable, with status epilepticus: Secondary | ICD-10-CM | POA: Diagnosis present

## 2020-08-16 DIAGNOSIS — J9611 Chronic respiratory failure with hypoxia: Secondary | ICD-10-CM | POA: Diagnosis present

## 2020-08-16 DIAGNOSIS — I5043 Acute on chronic combined systolic (congestive) and diastolic (congestive) heart failure: Secondary | ICD-10-CM | POA: Diagnosis present

## 2020-08-16 DIAGNOSIS — Z87891 Personal history of nicotine dependence: Secondary | ICD-10-CM

## 2020-08-16 DIAGNOSIS — N179 Acute kidney failure, unspecified: Secondary | ICD-10-CM | POA: Diagnosis not present

## 2020-08-16 DIAGNOSIS — I5021 Acute systolic (congestive) heart failure: Secondary | ICD-10-CM | POA: Diagnosis not present

## 2020-08-16 DIAGNOSIS — K219 Gastro-esophageal reflux disease without esophagitis: Secondary | ICD-10-CM | POA: Diagnosis present

## 2020-08-16 DIAGNOSIS — Z888 Allergy status to other drugs, medicaments and biological substances status: Secondary | ICD-10-CM

## 2020-08-16 DIAGNOSIS — A419 Sepsis, unspecified organism: Secondary | ICD-10-CM | POA: Diagnosis not present

## 2020-08-16 DIAGNOSIS — R4182 Altered mental status, unspecified: Secondary | ICD-10-CM | POA: Diagnosis not present

## 2020-08-16 DIAGNOSIS — G309 Alzheimer's disease, unspecified: Secondary | ICD-10-CM

## 2020-08-16 DIAGNOSIS — I480 Paroxysmal atrial fibrillation: Secondary | ICD-10-CM | POA: Diagnosis present

## 2020-08-16 DIAGNOSIS — Z20822 Contact with and (suspected) exposure to covid-19: Secondary | ICD-10-CM | POA: Diagnosis present

## 2020-08-16 DIAGNOSIS — G40909 Epilepsy, unspecified, not intractable, without status epilepticus: Secondary | ICD-10-CM | POA: Diagnosis not present

## 2020-08-16 DIAGNOSIS — Z7901 Long term (current) use of anticoagulants: Secondary | ICD-10-CM

## 2020-08-16 DIAGNOSIS — Z8249 Family history of ischemic heart disease and other diseases of the circulatory system: Secondary | ICD-10-CM

## 2020-08-16 DIAGNOSIS — R21 Rash and other nonspecific skin eruption: Secondary | ICD-10-CM | POA: Diagnosis not present

## 2020-08-16 DIAGNOSIS — Z9079 Acquired absence of other genital organ(s): Secondary | ICD-10-CM

## 2020-08-16 DIAGNOSIS — R531 Weakness: Secondary | ICD-10-CM | POA: Diagnosis not present

## 2020-08-16 DIAGNOSIS — M1990 Unspecified osteoarthritis, unspecified site: Secondary | ICD-10-CM | POA: Diagnosis present

## 2020-08-16 DIAGNOSIS — J449 Chronic obstructive pulmonary disease, unspecified: Secondary | ICD-10-CM | POA: Diagnosis present

## 2020-08-16 DIAGNOSIS — Z8551 Personal history of malignant neoplasm of bladder: Secondary | ICD-10-CM

## 2020-08-16 DIAGNOSIS — Z8261 Family history of arthritis: Secondary | ICD-10-CM

## 2020-08-16 DIAGNOSIS — I5023 Acute on chronic systolic (congestive) heart failure: Secondary | ICD-10-CM | POA: Diagnosis present

## 2020-08-16 DIAGNOSIS — I739 Peripheral vascular disease, unspecified: Secondary | ICD-10-CM | POA: Diagnosis present

## 2020-08-16 DIAGNOSIS — N400 Enlarged prostate without lower urinary tract symptoms: Secondary | ICD-10-CM | POA: Diagnosis present

## 2020-08-16 DIAGNOSIS — Z881 Allergy status to other antibiotic agents status: Secondary | ICD-10-CM

## 2020-08-16 LAB — PROTIME-INR
INR: 1.3 — ABNORMAL HIGH (ref 0.8–1.2)
Prothrombin Time: 15.8 seconds — ABNORMAL HIGH (ref 11.4–15.2)

## 2020-08-16 LAB — CBC WITH DIFFERENTIAL/PLATELET
Abs Immature Granulocytes: 0.01 10*3/uL (ref 0.00–0.07)
Basophils Absolute: 0 10*3/uL (ref 0.0–0.1)
Basophils Relative: 1 %
Eosinophils Absolute: 0.1 10*3/uL (ref 0.0–0.5)
Eosinophils Relative: 3 %
HCT: 41.1 % (ref 39.0–52.0)
Hemoglobin: 12.4 g/dL — ABNORMAL LOW (ref 13.0–17.0)
Immature Granulocytes: 0 %
Lymphocytes Relative: 26 %
Lymphs Abs: 1 10*3/uL (ref 0.7–4.0)
MCH: 26.6 pg (ref 26.0–34.0)
MCHC: 30.2 g/dL (ref 30.0–36.0)
MCV: 88.2 fL (ref 80.0–100.0)
Monocytes Absolute: 0.2 10*3/uL (ref 0.1–1.0)
Monocytes Relative: 5 %
Neutro Abs: 2.6 10*3/uL (ref 1.7–7.7)
Neutrophils Relative %: 65 %
Platelets: 165 10*3/uL (ref 150–400)
RBC: 4.66 MIL/uL (ref 4.22–5.81)
RDW: 17.4 % — ABNORMAL HIGH (ref 11.5–15.5)
WBC: 4 10*3/uL (ref 4.0–10.5)
nRBC: 0 % (ref 0.0–0.2)

## 2020-08-16 LAB — COMPREHENSIVE METABOLIC PANEL
ALT: 13 U/L (ref 0–44)
AST: 21 U/L (ref 15–41)
Albumin: 3.8 g/dL (ref 3.5–5.0)
Alkaline Phosphatase: 138 U/L — ABNORMAL HIGH (ref 38–126)
Anion gap: 7 (ref 5–15)
BUN: 38 mg/dL — ABNORMAL HIGH (ref 8–23)
CO2: 29 mmol/L (ref 22–32)
Calcium: 9.4 mg/dL (ref 8.9–10.3)
Chloride: 105 mmol/L (ref 98–111)
Creatinine, Ser: 1.66 mg/dL — ABNORMAL HIGH (ref 0.61–1.24)
GFR, Estimated: 37 mL/min — ABNORMAL LOW (ref >60)
Glucose, Bld: 137 mg/dL — ABNORMAL HIGH (ref 70–99)
Potassium: 4.1 mmol/L (ref 3.5–5.1)
Sodium: 141 mmol/L (ref 135–145)
Total Bilirubin: 0.3 mg/dL (ref 0.3–1.2)
Total Protein: 7.3 g/dL (ref 6.5–8.1)

## 2020-08-16 LAB — BLOOD GAS, ARTERIAL
Acid-Base Excess: 2.4 mmol/L — ABNORMAL HIGH (ref 0.0–2.0)
Bicarbonate: 25.8 mmol/L (ref 20.0–28.0)
FIO2: 28
O2 Saturation: 93.1 %
Patient temperature: 37
pCO2 arterial: 50.7 mmHg — ABNORMAL HIGH (ref 32.0–48.0)
pH, Arterial: 7.353 (ref 7.350–7.450)
pO2, Arterial: 73.7 mmHg — ABNORMAL LOW (ref 83.0–108.0)

## 2020-08-16 LAB — CBG MONITORING, ED: Glucose-Capillary: 120 mg/dL — ABNORMAL HIGH (ref 70–99)

## 2020-08-16 LAB — URINALYSIS, ROUTINE W REFLEX MICROSCOPIC
Bacteria, UA: NONE SEEN
Bilirubin Urine: NEGATIVE
Glucose, UA: NEGATIVE mg/dL
Ketones, ur: NEGATIVE mg/dL
Leukocytes,Ua: NEGATIVE
Nitrite: NEGATIVE
Protein, ur: 30 mg/dL — AB
Specific Gravity, Urine: 1.009 (ref 1.005–1.030)
pH: 5 (ref 5.0–8.0)

## 2020-08-16 LAB — APTT: aPTT: 30 seconds (ref 24–36)

## 2020-08-16 LAB — BRAIN NATRIURETIC PEPTIDE: B Natriuretic Peptide: 1856 pg/mL — ABNORMAL HIGH (ref 0.0–100.0)

## 2020-08-16 LAB — RESP PANEL BY RT-PCR (FLU A&B, COVID) ARPGX2
Influenza A by PCR: NEGATIVE
Influenza B by PCR: NEGATIVE
SARS Coronavirus 2 by RT PCR: NEGATIVE

## 2020-08-16 LAB — LACTIC ACID, PLASMA
Lactic Acid, Venous: 2.4 mmol/L (ref 0.5–1.9)
Lactic Acid, Venous: 1.7 mmol/L (ref 0.5–1.9)

## 2020-08-16 LAB — PROCALCITONIN: Procalcitonin: <0.1 ng/mL

## 2020-08-16 MED ORDER — LACTATED RINGERS IV SOLN: INTRAVENOUS | Status: AC

## 2020-08-16 MED ORDER — FUROSEMIDE 10 MG/ML IJ SOLN
20.0000 mg | Freq: Two times a day (BID) | INTRAMUSCULAR | Status: DC
  Filled 2020-08-16: qty 2

## 2020-08-16 MED ORDER — LEVOFLOXACIN IN D5W 750 MG/150ML IV SOLN
750.0000 mg | Freq: Once | INTRAVENOUS | Status: AC
  Administered 2020-08-16: 750 mg via INTRAVENOUS
  Filled 2020-08-16: qty 150

## 2020-08-16 MED ORDER — GABAPENTIN 400 MG PO CAPS
400.0000 mg | ORAL_CAPSULE | Freq: Two times a day (BID) | ORAL | Status: DC
  Administered 2020-08-17 – 2020-08-19 (×5): 400 mg via ORAL
  Filled 2020-08-16 (×6): qty 1

## 2020-08-16 MED ORDER — LEVETIRACETAM IN NACL 1000 MG/100ML IV SOLN
1000.0000 mg | Freq: Once | INTRAVENOUS | Status: AC
  Administered 2020-08-16: 1000 mg via INTRAVENOUS
  Filled 2020-08-16: qty 100

## 2020-08-16 MED ORDER — LEVOFLOXACIN IN D5W 750 MG/150ML IV SOLN: 750.0000 mg | INTRAVENOUS | Status: DC

## 2020-08-16 MED ORDER — ALBUTEROL SULFATE (2.5 MG/3ML) 0.083% IN NEBU: 2.5000 mg | INHALATION_SOLUTION | Freq: Four times a day (QID) | RESPIRATORY_TRACT | Status: DC | PRN

## 2020-08-16 MED ORDER — THIAMINE HCL 100 MG PO TABS
100.0000 mg | ORAL_TABLET | Freq: Every day | ORAL | Status: DC
  Administered 2020-08-17: 100 mg via ORAL
  Filled 2020-08-16: qty 1

## 2020-08-16 MED ORDER — CARVEDILOL 3.125 MG PO TABS
6.2500 mg | ORAL_TABLET | Freq: Two times a day (BID) | ORAL | Status: DC
  Administered 2020-08-17 – 2020-08-19 (×4): 6.25 mg via ORAL
  Filled 2020-08-16 (×4): qty 2

## 2020-08-16 MED ORDER — MIRABEGRON ER 25 MG PO TB24
25.0000 mg | ORAL_TABLET | Freq: Every day | ORAL | Status: DC
  Administered 2020-08-17 – 2020-08-19 (×3): 25 mg via ORAL
  Filled 2020-08-16 (×3): qty 1

## 2020-08-16 MED ORDER — APIXABAN 2.5 MG PO TABS
2.5000 mg | ORAL_TABLET | Freq: Two times a day (BID) | ORAL | Status: DC
  Administered 2020-08-17 (×2): 2.5 mg via ORAL
  Filled 2020-08-16 (×2): qty 1

## 2020-08-16 MED ORDER — SIMVASTATIN 20 MG PO TABS
10.0000 mg | ORAL_TABLET | Freq: Every day | ORAL | Status: DC
  Administered 2020-08-17 – 2020-08-18 (×2): 10 mg via ORAL
  Filled 2020-08-16 (×2): qty 1

## 2020-08-16 MED ORDER — POTASSIUM CHLORIDE CRYS ER 20 MEQ PO TBCR
20.0000 meq | EXTENDED_RELEASE_TABLET | Freq: Two times a day (BID) | ORAL | Status: DC
  Administered 2020-08-17 – 2020-08-18 (×3): 20 meq via ORAL
  Filled 2020-08-16 (×4): qty 1

## 2020-08-16 MED ORDER — ASPIRIN EC 81 MG PO TBEC
81.0000 mg | DELAYED_RELEASE_TABLET | Freq: Every day | ORAL | Status: DC
  Administered 2020-08-17 – 2020-08-19 (×3): 81 mg via ORAL
  Filled 2020-08-16 (×3): qty 1

## 2020-08-16 MED ORDER — FOLIC ACID 1 MG PO TABS
1.0000 mg | ORAL_TABLET | Freq: Every day | ORAL | Status: DC
  Administered 2020-08-17 – 2020-08-19 (×3): 1 mg via ORAL
  Filled 2020-08-16 (×3): qty 1

## 2020-08-16 MED ORDER — SODIUM CHLORIDE 0.9 % IV SOLN
75.0000 mL/h | INTRAVENOUS | Status: DC
  Administered 2020-08-17: 75 mL/h via INTRAVENOUS

## 2020-08-16 MED ORDER — DONEPEZIL HCL 5 MG PO TABS
10.0000 mg | ORAL_TABLET | Freq: Every day | ORAL | Status: DC
  Administered 2020-08-17 – 2020-08-18 (×3): 10 mg via ORAL
  Filled 2020-08-16 (×3): qty 2

## 2020-08-16 MED ORDER — FUROSEMIDE 10 MG/ML IJ SOLN
20.0000 mg | Freq: Once | INTRAMUSCULAR | Status: AC
  Administered 2020-08-16: 20 mg via INTRAVENOUS
  Filled 2020-08-16: qty 2

## 2020-08-16 MED ORDER — IPRATROPIUM-ALBUTEROL 20-100 MCG/ACT IN AERS: 2.0000 | INHALATION_SPRAY | Freq: Every day | RESPIRATORY_TRACT | Status: DC | PRN

## 2020-08-16 MED ORDER — FEBUXOSTAT 40 MG PO TABS
40.0000 mg | ORAL_TABLET | Freq: Every day | ORAL | Status: DC
  Administered 2020-08-17 – 2020-08-19 (×3): 40 mg via ORAL
  Filled 2020-08-16 (×3): qty 1

## 2020-08-16 MED ORDER — FLUTICASONE PROPIONATE 50 MCG/ACT NA SUSP: 1.0000 | Freq: Two times a day (BID) | NASAL | Status: DC | PRN

## 2020-08-16 MED ORDER — PANTOPRAZOLE SODIUM 40 MG PO TBEC
40.0000 mg | DELAYED_RELEASE_TABLET | Freq: Every day | ORAL | Status: DC
  Administered 2020-08-17: 40 mg via ORAL
  Filled 2020-08-16: qty 1

## 2020-08-16 MED ORDER — LEVETIRACETAM IN NACL 500 MG/100ML IV SOLN
500.0000 mg | Freq: Two times a day (BID) | INTRAVENOUS | Status: DC
  Administered 2020-08-17 – 2020-08-19 (×5): 500 mg via INTRAVENOUS
  Filled 2020-08-16 (×5): qty 100

## 2020-08-16 MED ORDER — SERTRALINE HCL 50 MG PO TABS
50.0000 mg | ORAL_TABLET | Freq: Every day | ORAL | Status: DC
  Administered 2020-08-17 – 2020-08-19 (×3): 50 mg via ORAL
  Filled 2020-08-16 (×3): qty 1

## 2020-08-16 NOTE — ED Provider Notes (Signed)
[facility] EMERGENCY DEPARTMENT Provider Note   CSN: [id number] Arrival date & time: 08/16/20  1812     History Chief Complaint  Patient presents with  . Seizures    Scott [name] is a 87 y.o. male.  HPI   Pt is a 87 y/o male - with afib (eliquis), hx of CAD - non obstructive, hx of Depression, dementia and ETOH abuse in the past - Htn, LBBB, and CHF - accompanied by a family member who is his caregive and states that he had 4 distinct seizure like episodes today - this started a few hours ago - has been intermittent - he stares off and has some shaking / myoclonic - and then has some confusion - he is not on seizure meds - may have had a seizure after rocephin in the past -no shortness of breath, takes 2 L of oxygen intermittently as needed but was not on oxygen today.  No complaints of diarrhea dysuria or any other complaints prior to this happening in fact he had a large breakfast this morning.  The patient is very stoic according to the family member he does not complain of anything.  The patient is not able to tell me anything at this time, he does seem confused, level 5 caveat applies  Past Medical History:  Diagnosis Date  . Arthritis    "all over"  . Atrial fibrillation ([place])   . Benign localized prostatic hyperplasia with lower urinary tract symptoms (LUTS)   . Bradycardia 11/28/2015   Severe-HR in the 20s to 30s following intubation for urological procedure.  Marland Kitchen CAD- non obstructive disease by cath 3/14 cardiologist-  dr hochrein  . Chronic lower back pain   . COPD (chronic obstructive pulmonary disease) ([place])   . DDD (degenerative disc disease)   . Depression   . Dyspnea    w/ exertion and lying down (raise head)  . ETOH abuse   . GERD (gastroesophageal reflux disease)   . Glaucoma   . Glaucoma, both eyes   . History of bladder cancer urologist-- dr Jeffie Pollock   first dx 2012--  s/p TURBT's  . History of DVT of lower extremity 03/2013   bilateral   . History of  pulmonary embolus (PE) 04/2013  . HTN (hypertension)   . Hyperlipidemia   . Hypertension   . LBBB (left bundle branch block)    chronic  . Non-ischemic cardiomyopathy ([place])    last echo 01-18-2017, ef 35-40%  . NSVT (nonsustained ventricular tachycardia) ([place])    a. NSVT 06/2012; NSVT also seen during 03/2013 adm. b. Med rx. Not candidate for ICD given adv age.  Marland Kitchen PAD (peripheral artery disease) ([place])   . 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 PVCs (premature ventricular contractions)   . PVD (peripheral vascular disease) (HCC)    Right ABI .75, Left .78 (2006)  . Syncope 06/2012   a. Felt to be postural syncope related to diuretics 06/2012.  Marland Kitchen Systolic and diastolic CHF, chronic (HCC) cardiologsit-  dr hochrein   a. NICM - patent cors 06/2012, EF 40% at that time. b. 03/2013 eval: EF 20-25%.  . Vision loss, left eye    due to glaucoma  . Wears glasses     Patient Active Problem List   Diagnosis Date Noted  . Acute respiratory acidosis 03/05/2020  . Bladder cancer ([place]) 03/04/2020  . 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  Acute respiratory failure with hypoxia (HCC)   . Alzheimer disease (HCC) 09/07/2018  . Paroxysmal atrial fibrillation (HCC) 01/18/2017  . PVC (pulmonary venous congestion) 01/18/2017  . Thoracic aortic atherosclerosis (HCC) 11/26/2016  . Thrombocytopenia (HCC) 08/04/2016  . Essential hypertension 11/29/2015  . Bradycardia 11/28/2015  . Malignant neoplasm of lateral wall of urinary bladder (HCC) 07/10/2015  . 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 Atherosclerotic PVD with ulceration (HCC) 10/03/2014  . PAD (peripheral artery disease) (HCC) 09/24/2014  . History of non-ST elevation myocardial infarction (NSTEMI) 10/09/2013  . High risk medication use 05/17/2013  . Vitamin D deficiency 05/17/2013  . History of pulmonary  embolism (2014) 04/17/2013  . NSVT (nonsustained ventricular tachycardia)- not an ICD candidate 04/17/2013  . NICM (nonischemic cardiomyopathy)- EF 20-25% by Echo 04/12/13 04/17/2013  . Chronic combined systolic and diastolic heart failure (HCC) 03/15/2013  . DVT of lower extremity, bilateral (HCC) 03/15/2013  . Benign localized hyperplasia of prostate with urinary obstruction 03/15/2012  . Hyperlipidemia   . PVCs (premature ventricular contractions)   . CAD- non obstructive disease by cath 3/14     Past Surgical History:  Procedure Laterality Date  . AMPUTATION Left 10/16/2014   Procedure: AMPUTATION ABOVE KNEE- LEFT;  Surgeon: Pryor Ochoa, MD;  [facility];  Service: Vascular;  Laterality: Left;  . CARDIAC CATHETERIZATION  05-11-2004  DR [facility]   MINIMAL CORANARY PLAQUE/ NORMAL LVF/ EF 55%/  NON-OBSTRUCTIVE LAD 25%  . CARDIAC CATHETERIZATION  06/18/2012   dr hochrein   mild luminal irregularities of coronaries, ef 45-50%  . CARDIOVASCULAR STRESS TEST  10-15-2010   LOW RISK NUCLEAR STUDY/ NO EVIDENCE OF ISCHEMIA/ NORMAL EF  . CATARACT EXTRACTION W/ INTRAOCULAR LENS  IMPLANT, BILATERAL Bilateral   . CYSTOSCOPY  10/07/2011   Procedure: CYSTOSCOPY;  Surgeon: Anner Crete, MD;  Location: [facility];  Service: Urology;  Laterality: N/A;  . CYSTOSCOPY N/A 10/20/2018   Procedure: CYSTOSCOPY WITH FULGURATION OF PROSTATIC URETHRAL TUMOR;  Surgeon: Bjorn Pippin, MD;  Location: [facility];  Service: Urology;  Laterality: N/A;  . CYSTOSCOPY N/A 10/11/2019   Procedure: CYSTOSCOPY;  Surgeon: Malen Gauze, MD;  Location: [facility];  Service: Urology;  Laterality: N/A;  . CYSTOSCOPY W/ RETROGRADES Bilateral 03/04/2020   Procedure: CYSTOSCOPY WITH BILATERAL RETROGRADE;  Surgeon: Bjorn Pippin, MD;  Location: [facility];  Service: Urology;  Laterality: Bilateral;  . CYSTOSCOPY WITH BIOPSY  03/14/2012   Procedure: CYSTOSCOPY WITH BIOPSY;  Surgeon: Anner Crete, MD;  Location: [facility];  Service:  Urology;  Laterality: N/A;  WITH FULGURATION  . CYSTOSCOPY WITH BIOPSY N/A 12/09/2016   Procedure: CYSTOSCOPY WITH BIOPSY AND FULGURATION;  Surgeon: Bjorn Pippin, MD;  Location: [facility];  Service: Urology;  Laterality: N/A;  . CYSTOSCOPY WITH BIOPSY N/A 12/20/2017   Procedure: CYSTOSCOPY WITH BIOPSY WITH FULGURATION;  Surgeon: Bjorn Pippin, MD;  Location: [facility];  Service: Urology;  Laterality: N/A;  . CYSTOSCOPY WITH BIOPSY N/A 06/09/2018   Procedure: CYSTOSCOPY WITH BIOPSY AND FULGURATION;  Surgeon: Bjorn Pippin, MD;  Location: [facility];  Service: Urology;  Laterality: N/A;  . CYSTOSCOPY WITH FULGERATION N/A 01/20/2016   Procedure: CYSTOSCOPY, BIOPSY WITH FULGERATION OF URTHRAL TUMOR;  Surgeon: Bjorn Pippin, MD;  Location: [facility];  Service: Urology;  Laterality: N/A;  . CYSTOSCOPY WITH FULGERATION N/A 06/01/2016   Procedure: CYSTOSCOPY WITH FULGERATION urethral tumors and bladder neck;  Surgeon: Bjorn Pippin, MD;  Location: [facility];  Service: Urology;  Laterality: N/A;  . SHOULDER SURGERY Left 1970's  . TONSILLECTOMY    . TRANSTHORACIC ECHOCARDIOGRAM  01-18-2017   dr hochrein   moderate LVH, ef 35-40%, diffuse hypokinesis/  trivial AR and MR/  mild TR  . TRANSURETHRAL RESECTION OF BLADDER TUMOR  10/07/2011   Procedure: TRANSURETHRAL RESECTION OF BLADDER TUMOR (TURBT);  Surgeon: Malka So, MD;  Location: [facility];  Service: Urology;  Laterality: N/A;  . TRANSURETHRAL RESECTION OF BLADDER TUMOR N/A 07/10/2015   Procedure: TRANSURETHRAL RESECTION OF BLADDER TUMOR (TURBT), CYSTOSCOPY ;  Surgeon: [name], MD;  Location: [facility];  Service: Urology;  Laterality: N/A;  . TRANSURETHRAL RESECTION OF BLADDER TUMOR N/A 10/11/2019   Procedure: TRANSURETHRAL RESECTION OF PROSTATIC URETHRAL TUMOR;  Surgeon: Cleon Gustin, MD;  Location: [facility];  Service: Urology;  Laterality: N/A;  . TRANSURETHRAL RESECTION OF BLADDER TUMOR WITH MITOMYCIN-C N/A 03/04/2020   Procedure: TRANSURETHRAL RESECTION OF  BLADDER TUMOR WITH GEMCITABINE;  Surgeon: [name], MD;  Location: [facility];  Service: Urology;  Laterality: N/A;  . TRANSURETHRAL RESECTION OF PROSTATE  03/14/2012   Procedure: TRANSURETHRAL RESECTION OF THE PROSTATE WITH GYRUS INSTRUMENTS;  Surgeon: Malka So, MD;  Location: [facility];  Service: Urology;  Laterality: N/A;       Family History  Problem Relation Age of Onset  . Hypertension Mother   . Arthritis Mother   . Lung cancer Sister   . Deep vein thrombosis Father   . Early death Brother     Social History   Tobacco Use  . Smoking status: Former Smoker    Packs/day: 1.00    Years: 35.00    Pack years: 35.00    Types: Cigarettes    Start date: 04/12/1942    Quit date: 04/13/1975    Years since quitting: 45.3  . Smokeless tobacco: Never Used  Vaping Use  . Vaping Use: Never used  Substance Use Topics  . Alcohol use: Not Currently    Alcohol/week: 7.0 standard drinks    Types: 7 Cans of beer per week  . Drug use: No    Home Medications Prior to Admission medications   Medication Sig Start Date End Date Taking? Authorizing Provider  acetaminophen (TYLENOL) 500 MG tablet Take 1,000 mg by mouth every 6 (six) hours as needed for moderate pain or headache.    [provider]  acyclovir (ZOVIRAX) 400 MG tablet Take 400 mg by mouth 5 (five) times daily.    [provider]  albuterol (PROVENTIL) (2.5 MG/3ML) 0.083% nebulizer solution Take 2.5 mg by nebulization every 6 (six) hours as needed for wheezing or shortness of breath.    [provider]  albuterol (VENTOLIN HFA) 108 (90 Base) MCG/ACT inhaler Inhale 2 puffs into the lungs every 6 (six) hours as needed for wheezing or shortness of breath.    [provider]  apixaban (ELIQUIS) 2.5 MG TABS tablet Take 1 tablet (2.5 mg total) by mouth 2 (two) times daily. 07/21/20   Dettinger, Fransisca Kaufmann, MD  carvedilol (COREG) 6.25 MG tablet Take 1 tablet (6.25 mg total) by mouth 2 (two) times daily with a  meal. 07/21/20   Dettinger, Fransisca Kaufmann, MD  donepezil (ARICEPT) 10 MG tablet Take 1 tablet (10 mg total) by mouth at bedtime. 07/21/20   Dettinger, Fransisca Kaufmann, MD  febuxostat (ULORIC) 40 MG tablet Take 1 tablet (40 mg total) by mouth daily. 07/21/20   Dettinger, Fransisca Kaufmann, MD  fluticasone (FLONASE) 50 MCG/ACT nasal  spray Place 1 spray into both nostrils 2 (two) times daily as needed for allergies or rhinitis. SPRAY 1 SPRAY IN EACH NOSTRIL ONCE DAILY. 04/17/20   Dettinger, Fransisca Kaufmann, MD  furosemide (LASIX) 20 MG tablet Take 1 tablet (20 mg total) by mouth daily as needed (signs of volume overload). 06/03/20   Minus Breeding, MD  gabapentin (NEURONTIN) 400 MG capsule Take 1 capsule (400 mg total) by mouth 2 (two) times daily. 07/21/20   Dettinger, Fransisca Kaufmann, MD  Ipratropium-Albuterol (COMBIVENT RESPIMAT) 20-100 MCG/ACT AERS respimat Inhale 1 puff into the lungs every 6 (six) hours as needed for wheezing or shortness of breath (As needed for wheezing , cough or shortness of breath). Patient taking differently: Inhale 2 puffs into the lungs daily as needed for shortness of breath or wheezing. 07/09/19   Roxan Hockey, MD  MYRBETRIQ 25 MG TB24 tablet TAKE 1 TABLET DAILY AS DIRECTED 07/29/20   McKenzie, Candee Furbish, MD  pantoprazole (PROTONIX) 40 MG tablet Take 1 tablet (40 mg total) by mouth daily. 07/21/20   Dettinger, Fransisca Kaufmann, MD  sertraline (ZOLOFT) 50 MG tablet Take 1 tablet (50 mg total) by mouth daily. 07/21/20   Dettinger, Fransisca Kaufmann, MD  simvastatin (ZOCOR) 10 MG tablet Take 1 tablet (10 mg total) by mouth daily at 6 PM. 07/21/20   Dettinger, Fransisca Kaufmann, MD    Allergies    Atorvastatin and Rocephin [ceftriaxone sodium in dextrose]  Review of Systems   Review of Systems  Unable to perform ROS: Mental status change    Physical Exam Updated Vital Signs BP (!) 146/79   Pulse 70   Temp 98.7 F (37.1 C) (Oral)   Resp 17   Ht 1.676 m (5\' 6" )   Wt 68.9 kg   SpO2 100%   BMI 24.52 kg/m   Physical  Exam Vitals and nursing note reviewed.  Constitutional:      General: He is not in acute distress.    Appearance: He is well-developed.  HENT:     Head: Normocephalic and atraumatic.     Mouth/Throat:     Pharynx: No oropharyngeal exudate.  Eyes:     General: No scleral icterus.       Right eye: No discharge.        Left eye: No discharge.     Conjunctiva/sclera: Conjunctivae normal.     Pupils: Pupils are equal, round, and reactive to light.  Neck:     Thyroid: No thyromegaly.     Vascular: No JVD.  Cardiovascular:     Rate and Rhythm: Normal rate and regular rhythm.     Heart sounds: Normal heart sounds. No murmur heard. No friction rub. No gallop.   Pulmonary:     Effort: Pulmonary effort is normal. No respiratory distress.     Breath sounds: Rhonchi and rales present. No wheezing.     Comments: [place] rhonchi and rales, no increased work of breathing Abdominal:     General: Bowel sounds are normal. There is no distension.     Palpations: Abdomen is soft. There is no mass.     Tenderness: There is no abdominal tenderness.  Musculoskeletal:        General: No tenderness. Normal range of motion.     Cervical back: Normal range of motion and neck supple.     Comments: Left lower extremity amputation present, no drainage from stump  Lymphadenopathy:     Cervical: No cervical adenopathy.  Skin:    General: Skin  is warm and dry.     Findings: No erythema or rash.  Neurological:     Mental Status: He is alert.     Coordination: Coordination normal.     Comments: The patient is able to squeeze with both hands, he can lift both legs, he does appear confused, altered, no obvious facial droop  Psychiatric:        Behavior: Behavior normal.     ED Results / Procedures / Treatments   Labs (all labs ordered are listed, but only abnormal results are displayed) Labs Reviewed  BLOOD GAS, ARTERIAL - Abnormal; Notable for the following components:      Result Value   pCO2  arterial 50.7 (*)    pO2, Arterial 73.7 (*)    Acid-Base Excess 2.4 (*)    All other components within normal limits  LACTIC ACID, PLASMA - Abnormal; Notable for the following components:   Lactic Acid, Venous 2.4 (*)    All other components within normal limits  COMPREHENSIVE METABOLIC PANEL - Abnormal; Notable for the following components:   Glucose, Bld 137 (*)    BUN 38 (*)    Creatinine, Ser 1.66 (*)    Alkaline Phosphatase 138 (*)    GFR, Estimated 37 (*)    All other components within normal limits  CBC WITH DIFFERENTIAL/PLATELET - Abnormal; Notable for the following components:   Hemoglobin 12.4 (*)    RDW 17.4 (*)    All other components within normal limits  PROTIME-INR - Abnormal; Notable for the following components:   Prothrombin Time 15.8 (*)    INR 1.3 (*)    All other components within normal limits  BRAIN NATRIURETIC PEPTIDE - Abnormal; Notable for the following components:   B Natriuretic Peptide 1,856.0 (*)    All other components within normal limits  CBG MONITORING, ED - Abnormal; Notable for the following components:   Glucose-Capillary 120 (*)    All other components within normal limits  CULTURE, BLOOD (ROUTINE X 2)  CULTURE, BLOOD (ROUTINE X 2)  RESP PANEL BY RT-PCR (FLU A&B, COVID) ARPGX2  URINE CULTURE  APTT  LACTIC ACID, PLASMA  URINALYSIS, ROUTINE W REFLEX MICROSCOPIC    EKG EKG Interpretation  Date/Time:  Saturday Aug 16 2020 18:48:05 EDT Ventricular Rate:  81 PR Interval:    QRS Duration: 147 QT Interval:  434 QTC Calculation: 504 R Axis:   -81 Text Interpretation: Atrial fibrillation Paired ventricular premature complexes Nonspecific IVCD with LAD LVH with secondary repolarization abnormality Confirmed by Noemi Chapel [phone]) on 08/16/2020 7:00:30 PM   Radiology CT Head Wo Contrast  Result Date: 08/16/2020 CLINICAL DATA:  87 year old male with seizure. EXAM: CT HEAD WITHOUT CONTRAST TECHNIQUE: Contiguous axial images were obtained  from the base of the skull through the vertex without intravenous contrast. COMPARISON:  Head CT dated 05/18/2020. FINDINGS: Brain: Moderate age-related atrophy and chronic microvascular ischemic changes. Focal area of old infarct and encephalomalacia involving the left basal ganglia. Cavum septum posterior and cavum vergae. There is no acute intracranial hemorrhage. No mass effect or midline shift. No extra-axial fluid collection. Vascular: No hyperdense vessel or unexpected calcification. Skull: Normal. Negative for fracture or focal lesion. Sinuses/Orbits: There is diffuse mucoperiosteal thickening of paranasal sinuses with opacification of left maxillary sinus. The mastoid air cells are clear. Other: None IMPRESSION: 1. No acute intracranial pathology. 2. Moderate age-related atrophy and chronic microvascular ischemic changes. Old left basal ganglia infarct. 3. Paranasal sinus disease. Electronically Signed   By: Milas Hock  Radparvar M.D.   On: 08/16/2020 20:10   DG Chest Port 1 View  Result Date: 08/16/2020 CLINICAL DATA:  87 year old male with sepsis. EXAM: PORTABLE CHEST 1 VIEW COMPARISON:  Chest radiograph dated 05/18/2020 and CT dated 07/04/2018. FINDINGS: There is diffuse chronic interstitial coarsening. There are bibasilar atelectasis/scarring. No new consolidation, pleural effusion, or pneumothorax. Stable cardiomegaly. Atherosclerotic calcification of the aorta. No acute osseous pathology. IMPRESSION: No interval change.  No new consolidation. Electronically Signed   By: Anner Crete M.D.   On: 08/16/2020 19:30    Procedures .Critical Care Performed by: Noemi Chapel, MD Authorized by: Noemi Chapel, MD   Critical care provider statement:    Critical care time (minutes):  35   Critical care time was exclusive of:  Separately billable procedures and treating other patients and teaching time   Critical care was necessary to treat or prevent imminent or life-threatening deterioration of the  following conditions:  CNS failure or compromise   Critical care was time spent personally by me on the following activities:  Blood draw for specimens, development of treatment plan with patient or surrogate, discussions with consultants, evaluation of patient's response to treatment, examination of patient, obtaining history from patient or surrogate, ordering and performing treatments and interventions, ordering and review of laboratory studies, ordering and review of radiographic studies, pulse oximetry, re-evaluation of patient's condition and review of old charts     Medications Ordered in ED Medications  lactated ringers infusion ( Intravenous New Bag/Given 08/16/20 1917)  levofloxacin (LEVAQUIN) IVPB 750 mg (750 mg Intravenous New Bag/Given 08/16/20 2013)  levofloxacin (LEVAQUIN) IVPB 750 mg (has no administration in time range)  levETIRAcetam (KEPPRA) IVPB 1000 mg/100 mL premix (0 mg Intravenous Stopped 08/16/20 1934)    ED Course  I have reviewed the triage vital signs and the nursing notes.  Pertinent labs & imaging results that were available during my care of the patient were reviewed by me and considered in my medical decision making (see chart for details).    MDM Rules/Calculators/A&P                          This patient is an elderly 87 year old male without significant history of epilepsy, he presents with a blood pressure of 89/64, oxygen of 82% on 2 L, he has what appears to be multiple abnormal vital signs, his blood sugar is 120, he will need work-up for cause of seizures.  EKG is abnormal but looks like he has left bundle branch block with occasional PVCs and a prolonged PR interval.  CT scan, labs, cardiac monitoring, anticipate admission, will load with Keppra just in case this is seizures  The patient has been loaded with Keppra, lactic acid is just elevated at 2.4, Levaquin was given just in case there was pneumonia but thankfully the x-ray does not reveal any obvious  infiltrate.  The patient's blood pressure is improved, he will be admitted to the hospital for these seizures.  Dr. Nehemiah Settle has been kind enough to come see the patient for admission  Final Clinical Impression(s) / ED Diagnoses Final diagnoses:  Seizure [facility])  AKI (acute kidney injury) [facility])    Rx / [place] Orders ED Discharge Orders    None       Noemi Chapel, MD 08/16/20 2048

## 2020-08-16 NOTE — ED Triage Notes (Signed)
Patient had what appeared to be x3 seizures lasting approx 40-45 seconds. Per caregiver patient gaze shifted and patient had tremors. Patient slightly confused after but mental status back to normal at this time. Per Caregiver patient had similar episodes x1-2 years ago from reaction to rocephin when he was admitted for CHF. No other hx of seizures. Patient denies any pain. Patient's son called stating patient had episodes of this x3-4 weeks ago with another caregiver and EMS assessed him and he was back to normal so did not go to hospital.

## 2020-08-16 NOTE — H&P (Addendum)
History and Physical  Scott [name] ZOX:[id number] DOB: 1921-07-14 DOA: 08/16/2020  Referring physician: Dr Sabra Heck, ED physician PCP: Dettinger, Fransisca Kaufmann, MD  Outpatient Specialists:   Patient Coming From: home  Chief Complaint: Seizures  HPI: Scott [name] is a 87 y.o. male with a history of paroxysmal atrial fibrillation, coronary artery disease, nonischemic cardiomyopathy, COPD, peripheral artery disease, status post left AKA, hypertension, history of PE and DVT currently on Eliquis, systolic and diastolic heart failure with last echo in 9/21 with an EF of 30%, Alzheimer's.  As patient has Alzheimer's, he is unable to provide history and the history is provided by son and caregiver.  Patient presents with 4 witnessed seizure-like activities.  These were separate events and all started a few hours prior to presentation.  These episodes are intermittent and are characterized by him staring off with some shaking or myoclonic activity.  Is followed by the brief period of time with confusion.  The symptoms are self-limiting and are brief.  They are not worsening.  No palliating or provoking factors.  No new medications or dose adjustments.  No fevers, chills, nausea, vomiting.  During the episodes, there is perhaps a little left face droop.  Emergency Department Course: Patient mildly hypoxic in the emergency department.  He was started on Levaquin.  Blood cultures obtained.  He was also started on some IV fluids.  Lactic acid slightly elevated at 2.7 and decreased to 1.7 on repeat.  Review of Systems:  Patient unable to provide due to Alzheimer's dementia  Past Medical History:  Diagnosis Date  . Arthritis    "all over"  . Atrial fibrillation ([place])   . Benign localized prostatic hyperplasia with lower urinary tract symptoms (LUTS)   . Bradycardia 11/28/2015   Severe-HR in the 20s to 30s following intubation for urological procedure.  Marland Kitchen CAD- non obstructive disease by cath 3/14 cardiologist-   dr hochrein  . Chronic lower back pain   . COPD (chronic obstructive pulmonary disease) ([place])   . DDD (degenerative disc disease)   . Depression   . Dyspnea    w/ exertion and lying down (raise head)  . ETOH abuse   . GERD (gastroesophageal reflux disease)   . Glaucoma   . Glaucoma, both eyes   . History of bladder cancer urologist-- dr Jeffie Pollock   first dx 2012--  s/p TURBT's  . History of DVT of lower extremity 03/2013   bilateral   . History of pulmonary embolus (PE) 04/2013  . HTN (hypertension)   . Hyperlipidemia   . Hypertension   . LBBB (left bundle branch block)    chronic  . Non-ischemic cardiomyopathy ([place])    last echo 01-18-2017, ef 35-40%  . NSVT (nonsustained ventricular tachycardia) ([place])    a. NSVT 06/2012; NSVT also seen during 03/2013 adm. b. Med rx. Not candidate for ICD given adv age.  Marland Kitchen PAD (peripheral artery disease) ([place])   . 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 PVCs (premature ventricular contractions)   . PVD (peripheral vascular disease) (HCC)    Right ABI .75, Left .78 (2006)  . Syncope 06/2012   a. Felt to be postural syncope related to diuretics 06/2012.  Marland Kitchen Systolic and diastolic CHF, chronic (HCC) cardiologsit-  dr hochrein   a. NICM - patent cors 06/2012, EF 40% at that time. b. 03/2013 eval: EF 20-25%.  . Vision loss, left eye  due to glaucoma  . Wears glasses    Past Surgical History:  Procedure Laterality Date  . AMPUTATION Left 10/16/2014   Procedure: AMPUTATION ABOVE KNEE- LEFT;  Surgeon: Mal Misty, MD;  Location: [facility];  Service: Vascular;  Laterality: Left;  . CARDIAC CATHETERIZATION  05-11-2004  DR [facility]   MINIMAL CORANARY PLAQUE/ NORMAL LVF/ EF 55%/  NON-OBSTRUCTIVE LAD 25%  . CARDIAC CATHETERIZATION  06/18/2012   dr hochrein   mild luminal irregularities of coronaries, ef 45-50%  . CARDIOVASCULAR STRESS TEST  10-15-2010   LOW RISK  NUCLEAR STUDY/ NO EVIDENCE OF ISCHEMIA/ NORMAL EF  . CATARACT EXTRACTION W/ INTRAOCULAR LENS  IMPLANT, BILATERAL Bilateral   . CYSTOSCOPY  10/07/2011   Procedure: CYSTOSCOPY;  Surgeon: Malka So, MD;  Location: [facility];  Service: Urology;  Laterality: N/A;  . CYSTOSCOPY N/A 10/20/2018   Procedure: CYSTOSCOPY WITH FULGURATION OF PROSTATIC URETHRAL TUMOR;  Surgeon: [name], MD;  Location: [facility];  Service: Urology;  Laterality: N/A;  . CYSTOSCOPY N/A 10/11/2019   Procedure: CYSTOSCOPY;  Surgeon: Cleon Gustin, MD;  Location: [facility];  Service: Urology;  Laterality: N/A;  . CYSTOSCOPY W/ RETROGRADES Bilateral 03/04/2020   Procedure: CYSTOSCOPY WITH BILATERAL RETROGRADE;  Surgeon: [name], MD;  Location: [facility];  Service: Urology;  Laterality: Bilateral;  . CYSTOSCOPY WITH BIOPSY  03/14/2012   Procedure: CYSTOSCOPY WITH BIOPSY;  Surgeon: Malka So, MD;  Location[facility];  Service: Urology;  Laterality: N/A;  WITH FULGURATION  . CYSTOSCOPY WITH BIOPSY N/A 12/09/2016   Procedure: CYSTOSCOPY WITH BIOPSY AND FULGURATION;  Surgeon: [name], MD;  [facility];  Service: Urology;  Laterality: N/A;  . CYSTOSCOPY WITH BIOPSY N/A 12/20/2017   Procedure: CYSTOSCOPY WITH BIOPSY WITH FULGURATION;  Surgeon: [name], MD;  [facility];  Service: Urology;  Laterality: N/A;  . CYSTOSCOPY WITH BIOPSY N/A 06/09/2018   Procedure: CYSTOSCOPY WITH BIOPSY AND FULGURATION;  Surgeon: [name], MD;  Location[facility];  Service: Urology;  Laterality: N/A;  . CYSTOSCOPY WITH FULGERATION N/A 01/20/2016   Procedure: CYSTOSCOPY, BIOPSY WITH FULGERATION OF URTHRAL TUMOR;  Surgeon: [name], MD;  [facility];  Service: Urology;  Laterality: N/A;  . CYSTOSCOPY WITH FULGERATION N/A 06/01/2016   Procedure: CYSTOSCOPY WITH FULGERATION urethral tumors and bladder neck;  Surgeon: [name], MD;  [facility];  Service: Urology;  Laterality: N/A;  . SHOULDER SURGERY Left 1970's   . TONSILLECTOMY    . TRANSTHORACIC ECHOCARDIOGRAM  01-18-2017   dr hochrein   moderate LVH, ef 35-40%, diffuse hypokinesis/  trivial AR and MR/  mild TR  . TRANSURETHRAL RESECTION OF BLADDER TUMOR  10/07/2011   Procedure: TRANSURETHRAL RESECTION OF BLADDER TUMOR (TURBT);  Surgeon: Malka So, MD;  Location: [facility];  Service: Urology;  Laterality: N/A;  . TRANSURETHRAL RESECTION OF BLADDER TUMOR N/A 07/10/2015   Procedure: TRANSURETHRAL RESECTION OF BLADDER TUMOR (TURBT), CYSTOSCOPY ;  Surgeon: [name], MD;  [facility];  Service: Urology;  Laterality: N/A;  . TRANSURETHRAL RESECTION OF BLADDER TUMOR N/A 10/11/2019   Procedure: TRANSURETHRAL RESECTION OF PROSTATIC URETHRAL TUMOR;  Surgeon: Cleon Gustin, MD;  [facility];  Service: Urology;  Laterality: N/A;  . TRANSURETHRAL RESECTION OF BLADDER TUMOR WITH MITOMYCIN-C N/A 03/04/2020   Procedure: TRANSURETHRAL RESECTION OF BLADDER TUMOR WITH GEMCITABINE;  Surgeon: [name], MD;  [facility];  Service: Urology;  Laterality: N/A;  . TRANSURETHRAL RESECTION OF  PROSTATE  03/14/2012   Procedure: TRANSURETHRAL RESECTION OF THE PROSTATE WITH GYRUS INSTRUMENTS;  Surgeon: Malka So, MD;  [facility];  Service: Urology;  Laterality: N/A;   Social History:  reports that he quit smoking about 45 years ago. His smoking use included cigarettes. He started smoking about 78 years ago. He has a 35.00 pack-year smoking history. He has never used smokeless tobacco. He reports previous alcohol use of about 7.0 standard drinks of alcohol per week. He reports that he does not use drugs. Patient lives at home  Allergies  Allergen Reactions  . Atorvastatin Hives and Rash  . Rocephin [Ceftriaxone Sodium In Dextrose] Other (See Comments)    Recurrent seizures    Family History  Problem Relation Age of Onset  . Hypertension Mother   . Arthritis Mother   . Lung cancer Sister   . Deep vein thrombosis Father   .  Early death Brother       Prior to Admission medications   Medication Sig Start Date End Date Taking? Authorizing Provider  acetaminophen (TYLENOL) 500 MG tablet Take 1,000 mg by mouth every 6 (six) hours as needed for moderate pain or headache.   Yes [provider]  albuterol (PROVENTIL) (2.5 MG/3ML) 0.083% nebulizer solution Take 2.5 mg by nebulization every 6 (six) hours as needed for wheezing or shortness of breath.   Yes [provider]  albuterol (VENTOLIN HFA) 108 (90 Base) MCG/ACT inhaler Inhale 2 puffs into the lungs every 6 (six) hours as needed for wheezing or shortness of breath.   Yes [provider]  apixaban (ELIQUIS) 2.5 MG TABS tablet Take 1 tablet (2.5 mg total) by mouth 2 (two) times daily. 07/21/20  Yes Dettinger, Fransisca Kaufmann, MD  carvedilol (COREG) 6.25 MG tablet Take 1 tablet (6.25 mg total) by mouth 2 (two) times daily with a meal. 07/21/20  Yes Dettinger, Fransisca Kaufmann, MD  donepezil (ARICEPT) 10 MG tablet Take 1 tablet (10 mg total) by mouth at bedtime. 07/21/20  Yes Dettinger, Fransisca Kaufmann, MD  febuxostat (ULORIC) 40 MG tablet Take 1 tablet (40 mg total) by mouth daily. 07/21/20  Yes Dettinger, Fransisca Kaufmann, MD  fluticasone (FLONASE) 50 MCG/ACT nasal spray Place 1 spray into both nostrils 2 (two) times daily as needed for allergies or rhinitis. SPRAY 1 SPRAY IN EACH NOSTRIL ONCE DAILY. 04/17/20  Yes Dettinger, Fransisca Kaufmann, MD  furosemide (LASIX) 20 MG tablet Take 1 tablet (20 mg total) by mouth daily as needed (signs of volume overload). 06/03/20  Yes Minus Breeding, MD  gabapentin (NEURONTIN) 400 MG capsule Take 1 capsule (400 mg total) by mouth 2 (two) times daily. 07/21/20  Yes Dettinger, Fransisca Kaufmann, MD  Ipratropium-Albuterol (COMBIVENT RESPIMAT) 20-100 MCG/ACT AERS respimat Inhale 1 puff into the lungs every 6 (six) hours as needed for wheezing or shortness of breath (As needed for wheezing , cough or shortness of breath). Patient taking differently: Inhale 2 puffs into  the lungs daily as needed for shortness of breath or wheezing. 07/09/19  Yes Emokpae, Courage, MD  MYRBETRIQ 25 MG TB24 tablet TAKE 1 TABLET DAILY AS DIRECTED Patient taking differently: Take 25 mg by mouth daily. 07/29/20  Yes McKenzie, Candee Furbish, MD  pantoprazole (PROTONIX) 40 MG tablet Take 1 tablet (40 mg total) by mouth daily. 07/21/20  Yes Dettinger, Fransisca Kaufmann, MD  sertraline (ZOLOFT) 50 MG tablet Take 1 tablet (50 mg total) by mouth daily. 07/21/20  Yes Dettinger, Fransisca Kaufmann, MD  simvastatin (ZOCOR) 10  MG tablet Take 1 tablet (10 mg total) by mouth daily at 6 PM. 07/21/20  Yes Dettinger, Fransisca Kaufmann, MD    Physical Exam: BP 134/76   Pulse 71   Temp 98.7 F (37.1 C) (Oral)   Resp 19   Ht 5\' 6"  (1.676 m)   Wt 68.9 kg   SpO2 100%   BMI 24.52 kg/m   . General: Elderly male. Awake and alert and oriented x1. No acute cardiopulmonary distress.  Marland Kitchen HEENT: Normocephalic atraumatic.  Right and left ears normal in appearance.  Pupils equal, round, reactive to light. Extraocular muscles are intact. Sclerae anicteric and noninjected.  Moist mucosal membranes. No mucosal lesions.  . Neck: Neck supple without lymphadenopathy. No carotid bruits. No masses palpated.  . Cardiovascular: Irregularly irregular rate with normal S1-S2 sounds. No murmurs, rubs, gallops auscultated. No JVD.  Marland Kitchen Respiratory: Mild rales in the bases bilaterally..  No accessory muscle use. . Abdomen: Soft, nontender, nondistended. Active bowel sounds. No masses or hepatosplenomegaly  . Skin: No rashes, lesions, or ulcerations.  Dry, warm to touch. 2+ dorsalis pedis and radial pulses. . Musculoskeletal: No calf or leg pain. All major joints not erythematous nontender.  No upper or lower joint deformation.  Good ROM.  No contractures  . Psychiatric: Intact judgment and insight. Pleasant and cooperative. . Neurologic: Strength is 5/5 and symmetric in upper and lower extremities.  Cranial nerves II through XII are grossly intact.            Labs on Admission: I have personally reviewed following labs and imaging studies  CBC: Recent Labs  Lab 08/16/20 1847  WBC 4.0  NEUTROABS 2.6  HGB 12.4*  HCT 41.1  MCV 88.2  PLT 123XX123   Basic Metabolic Panel: Recent Labs  Lab 08/16/20 1847  NA 141  K 4.1  CL 105  CO2 29  GLUCOSE 137*  BUN 38*  CREATININE 1.66*  CALCIUM 9.4   GFR: Estimated Creatinine Clearance: 23.5 mL/min (A) (by C-G formula based on SCr of 1.66 mg/dL (H)). Liver Function Tests: Recent Labs  Lab 08/16/20 1847  AST 21  ALT 13  ALKPHOS 138*  BILITOT 0.3  PROT 7.3  ALBUMIN 3.8   No results for input(s): LIPASE, AMYLASE in the last 168 hours. No results for input(s): AMMONIA in the last 168 hours. Coagulation Profile: Recent Labs  Lab 08/16/20 1847  INR 1.3*   Cardiac Enzymes: No results for input(s): CKTOTAL, CKMB, CKMBINDEX, TROPONINI in the last 168 hours. BNP (last 3 results) No results for input(s): PROBNP in the last 8760 hours. HbA1C: No results for input(s): HGBA1C in the last 72 hours. CBG: Recent Labs  Lab 08/16/20 1854  GLUCAP 120*   Lipid Profile: No results for input(s): CHOL, HDL, LDLCALC, TRIG, CHOLHDL, LDLDIRECT in the last 72 hours. Thyroid Function Tests: No results for input(s): TSH, T4TOTAL, FREET4, T3FREE, THYROIDAB in the last 72 hours. Anemia Panel: No results for input(s): VITAMINB12, FOLATE, FERRITIN, TIBC, IRON, RETICCTPCT in the last 72 hours. Urine analysis:    Component Value Date/Time   COLORURINE STRAW (A) 05/18/2020 1954   APPEARANCEUR Clear 07/21/2020 1554   LABSPEC 1.009 05/18/2020 1954   PHURINE 5.0 05/18/2020 1954   GLUCOSEU Negative 07/21/2020 1554   HGBUR LARGE (A) 05/18/2020 1954   BILIRUBINUR Negative 07/21/2020 [place] 05/18/2020 1954   PROTEINUR 2+ (A) 07/21/2020 [place] 05/18/2020 1954   UROBILINOGEN negative 05/19/2015 1203   UROBILINOGEN 1.0 10/22/2014 1804  NITRITE Negative 07/21/2020 1554    NITRITE NEGATIVE 05/18/2020 1954   LEUKOCYTESUR Negative 07/21/2020 [place] 05/18/2020 1954   Sepsis Labs: @LABRCNTIP (procalcitonin:4,lacticidven:4) ) Recent Results (from the past 240 hour(s))  Blood Culture (routine x 2)     Status: None (Preliminary result)   Collection Time: 08/16/20  7:14 PM   Specimen: BLOOD RIGHT FOREARM  Result Value Ref Range Status   Specimen Description BLOOD RIGHT FOREARM  Final   Special Requests   Final    Blood Culture results may not be optimal due to an inadequate volume of blood received in culture bottles BOTTLES DRAWN AEROBIC AND ANAEROBIC Performed at [facility], [address] [place], [place] [address]    Culture PENDING  Incomplete   Report Status PENDING  Incomplete  Blood Culture (routine x 2)     Status: None (Preliminary result)   Collection Time: 08/16/20  7:14 PM   Specimen: BLOOD RIGHT HAND  Result Value Ref Range Status   Specimen Description BLOOD RIGHT HAND  Final   Special Requests   Final    Blood Culture results may not be optimal due to an inadequate volume of blood received in culture bottles BOTTLES DRAWN AEROBIC AND ANAEROBIC Performed at [facility], [address] [place], [place] [address]    Culture PENDING  Incomplete   Report Status PENDING  Incomplete  Resp Panel by RT-PCR (Flu A&B, Covid) Nasopharyngeal Swab     Status: None   Collection Time: 08/16/20  8:20 PM   Specimen: Nasopharyngeal Swab; Nasopharyngeal(NP) swabs in vial transport medium  Result Value Ref Range Status   SARS Coronavirus 2 by RT PCR NEGATIVE NEGATIVE Final    Comment: (NOTE) SARS-CoV-2 target nucleic acids are NOT DETECTED.  The SARS-CoV-2 RNA is generally detectable in upper respiratory specimens during the acute phase of infection. The lowest concentration of SARS-CoV-2 viral copies this assay can detect is 138 copies/mL. A negative result does not preclude SARS-Cov-2 infection and should not be used as the  sole basis for treatment or other patient management decisions. A negative result may occur with  improper specimen collection/handling, submission of specimen other than nasopharyngeal swab, presence of viral mutation(s) within the areas targeted by this assay, and inadequate number of viral copies(<138 copies/mL). A negative result must be combined with clinical observations, patient history, and epidemiological information. The expected result is Negative.  Fact Sheet for Patients:  EntrepreneurPulse.com.au  Fact Sheet for Healthcare Providers:  IncredibleEmployment.be  This test is no t yet approved or cleared by the [place] FDA and  has been authorized for detection and/or diagnosis of SARS-CoV-2 by FDA under an Emergency Use Authorization (EUA). This EUA will remain  in effect (meaning this test can be used) for the duration of the COVID-19 declaration under Section 564(b)(1) of the Act, 21 U.S.C.section 360bbb-3(b)(1), unless the authorization is terminated  or revoked sooner.       Influenza A by PCR NEGATIVE NEGATIVE Final   Influenza B by PCR NEGATIVE NEGATIVE Final    Comment: (NOTE) The Xpert Xpress SARS-CoV-2/FLU/RSV plus assay is intended as an aid in the diagnosis of influenza from Nasopharyngeal swab specimens and should not be used as a sole basis for treatment. Nasal washings and aspirates are unacceptable for Xpert Xpress SARS-CoV-2/FLU/RSV testing.  Fact Sheet for Patients: EntrepreneurPulse.com.au  Fact Sheet for Healthcare Providers: IncredibleEmployment.be  This test is not yet approved or cleared by the [place] and has been authorized for  detection and/or diagnosis of SARS-CoV-2 by FDA under an Emergency Use Authorization (EUA). This EUA will remain in effect (meaning this test can be used) for the duration of the COVID-19 declaration under Section 564(b)(1) of the  Act, 21 U.S.C. section 360bbb-3(b)(1), unless the authorization is terminated or revoked.  Performed at [facility], [address] [place], [place] [address]      Radiological Exams on Admission: CT Head Wo Contrast  Result Date: 08/16/2020 CLINICAL DATA:  87 year old male with seizure. EXAM: CT HEAD WITHOUT CONTRAST TECHNIQUE: Contiguous axial images were obtained from the base of the skull through the vertex without intravenous contrast. COMPARISON:  Head CT dated 05/18/2020. FINDINGS: Brain: Moderate age-related atrophy and chronic microvascular ischemic changes. Focal area of old infarct and encephalomalacia involving the left basal ganglia. Cavum septum posterior and cavum vergae. There is no acute intracranial hemorrhage. No mass effect or midline shift. No extra-axial fluid collection. Vascular: No hyperdense vessel or unexpected calcification. Skull: Normal. Negative for fracture or focal lesion. Sinuses/Orbits: There is diffuse mucoperiosteal thickening of paranasal sinuses with opacification of left maxillary sinus. The mastoid air cells are clear. Other: None IMPRESSION: 1. No acute intracranial pathology. 2. Moderate age-related atrophy and chronic microvascular ischemic changes. Old left basal ganglia infarct. 3. Paranasal sinus disease. Electronically Signed   By: Anner Crete M.D.   On: 08/16/2020 20:10   DG Chest Port 1 View  Result Date: 08/16/2020 CLINICAL DATA:  87 year old male with sepsis. EXAM: PORTABLE CHEST 1 VIEW COMPARISON:  Chest radiograph dated 05/18/2020 and CT dated 07/04/2018. FINDINGS: There is diffuse chronic interstitial coarsening. There are bibasilar atelectasis/scarring. No new consolidation, pleural effusion, or pneumothorax. Stable cardiomegaly. Atherosclerotic calcification of the aorta. No acute osseous pathology. IMPRESSION: No interval change.  No new consolidation. Electronically Signed   By: Anner Crete M.D.   On: 08/16/2020 19:30    EKG:  Independently reviewed.  Atrial fibrillation with paired PVCs.  Nonspecific interventricular conduction delay.  LVH.  No acute ST changes  Assessment/Plan: Principal Problem:   Seizure ([place]) Active Problems:   CAD- non obstructive disease by cath 3/14   DVT of lower extremity, bilateral (HCC)   History of pulmonary embolism (2014)   History of non-ST elevation myocardial infarction (NSTEMI)   PAD (peripheral artery disease) ([place])   Status post above-knee amputation of left lower extremity (HCC)   Malignant neoplasm of lateral wall of urinary bladder (HCC)   Essential hypertension   Paroxysmal atrial fibrillation (HCC)   Alzheimer disease (HCC)   Acute on chronic systolic CHF (congestive heart failure) ([place])    This patient was discussed with the ED physician, including pertinent vitals, physical exam findings, labs, and imaging.  We also discussed care given by the ED provider.  1. Seizure-like activity a. Patient loaded with Keppra.  The patient's caregiver did report the episodes, which I viewed.  During the episodes, there is some mild facial droop. b. Continue Keppra c. Will check MRI on Monday d. Neurology to consult on Monday 2. Acute on chronic systolic heart failure a. We will diurese b. Echocardiogram tomorrow c. Potassium replacement d. Continue BMPs 3. Coronary artery disease with history of NSTEMI a. Continue antihypertensives b. Aspirin 4. PAF a. On Eliquis b. Rate controlled 5. Hypertension a. Continue carvedilol 6. Alzheimer's 7. History of PE and DVT a. On Eliquis 8. Peripheral artery disease  DVT prophylaxis: Eliquis Consultants: Neurology Code Status: DNR confirmed by son and caregiver Family Communication: Son Disposition Plan: Pending  Truett Mainland, DO

## 2020-08-16 NOTE — Progress Notes (Signed)
Elink following for Sepsis Protocol 

## 2020-08-16 NOTE — Progress Notes (Signed)
Pharmacy Antibiotic Note  CASH DUCE is a 87 y.o. male admitted on 08/16/2020 with pneumonia.  Pharmacy has been consulted for levaquin dosing.  Plan: levaquin 750mg  iv q48h  Height: 5\' 6"  (167.6 cm) Weight: 68.9 kg (151 lb 14.4 oz) IBW/kg (Calculated) : 63.8  Temp (24hrs), Avg:98.7 F (37.1 C), Min:98.7 F (37.1 C), Max:98.7 F (37.1 C)  Recent Labs  Lab 08/16/20 1847  WBC 4.0  CREATININE 1.66*    Estimated Creatinine Clearance: 23.5 mL/min (A) (by C-G formula based on SCr of 1.66 mg/dL (H)).    Allergies  Allergen Reactions  . Atorvastatin Hives and Rash  . Rocephin [Ceftriaxone Sodium In Dextrose] Other (See Comments)    Recurrent seizures    Antimicrobials this admission: 5/7 levaquin >>    Microbiology results: 5/7 BCx: sent 5/7 UCx: sent   Thank you for allowing pharmacy to be a part of this patient's care.  Donna Christen Arthi Mcdonald 08/16/2020 7:30 PM

## 2020-08-16 NOTE — ED Notes (Signed)
Date and time results received: 08/16/20 @ 1940 (use smartphrase ".now" to insert current time)  Test: Lactic Acid  Critical Value: 2.4 Name of Provider Notified: Dr. Sabra Heck  Orders Received? None Or Actions Taken?: None

## 2020-08-16 NOTE — ED Triage Notes (Signed)
Dr Sabra Heck in room assessing patient for medical screening.

## 2020-08-17 ENCOUNTER — Inpatient Hospital Stay (HOSPITAL_COMMUNITY): Payer: Medicare Other

## 2020-08-17 DIAGNOSIS — I5021 Acute systolic (congestive) heart failure: Secondary | ICD-10-CM | POA: Diagnosis not present

## 2020-08-17 DIAGNOSIS — R569 Unspecified convulsions: Secondary | ICD-10-CM

## 2020-08-17 LAB — CBC
HCT: 37.6 % — ABNORMAL LOW (ref 39.0–52.0)
Hemoglobin: 11.5 g/dL — ABNORMAL LOW (ref 13.0–17.0)
MCH: 26.6 pg (ref 26.0–34.0)
MCHC: 30.6 g/dL (ref 30.0–36.0)
MCV: 87 fL (ref 80.0–100.0)
Platelets: 146 10*3/uL — ABNORMAL LOW (ref 150–400)
RBC: 4.32 MIL/uL (ref 4.22–5.81)
RDW: 17.2 % — ABNORMAL HIGH (ref 11.5–15.5)
WBC: 4.4 10*3/uL (ref 4.0–10.5)
nRBC: 0 % (ref 0.0–0.2)

## 2020-08-17 LAB — BASIC METABOLIC PANEL
Anion gap: 7 (ref 5–15)
BUN: 33 mg/dL — ABNORMAL HIGH (ref 8–23)
CO2: 31 mmol/L (ref 22–32)
Calcium: 9 mg/dL (ref 8.9–10.3)
Chloride: 102 mmol/L (ref 98–111)
Creatinine, Ser: 1.24 mg/dL (ref 0.61–1.24)
GFR, Estimated: 53 mL/min — ABNORMAL LOW (ref >60)
Glucose, Bld: 86 mg/dL (ref 70–99)
Potassium: 4.2 mmol/L (ref 3.5–5.1)
Sodium: 140 mmol/L (ref 135–145)

## 2020-08-17 LAB — ECHOCARDIOGRAM COMPLETE
Area-P 1/2: 4.6 cm2
Calc EF: 26.8 %
Height: 66 in
S' Lateral: 4.7 cm
Single Plane A2C EF: 23.6 %
Single Plane A4C EF: 27.9 %
Weight: 2430.35 oz

## 2020-08-17 MED ORDER — LORAZEPAM 2 MG/ML IJ SOLN: 2.0000 mg | INTRAMUSCULAR | Status: DC | PRN

## 2020-08-17 MED ORDER — ENOXAPARIN SODIUM 80 MG/0.8ML IJ SOSY
70.0000 mg | PREFILLED_SYRINGE | Freq: Two times a day (BID) | INTRAMUSCULAR | Status: DC
  Administered 2020-08-17 – 2020-08-18 (×2): 70 mg via SUBCUTANEOUS
  Filled 2020-08-17 (×2): qty 0.8

## 2020-08-17 MED ORDER — LABETALOL HCL 5 MG/ML IV SOLN: 10.0000 mg | INTRAVENOUS | Status: DC | PRN

## 2020-08-17 MED ORDER — SODIUM CHLORIDE 0.9 % IV SOLN: INTRAVENOUS | Status: DC

## 2020-08-17 MED ORDER — THIAMINE HCL 100 MG/ML IJ SOLN
100.0000 mg | Freq: Every day | INTRAMUSCULAR | Status: DC
  Administered 2020-08-18 – 2020-08-19 (×2): 100 mg via INTRAVENOUS
  Filled 2020-08-17 (×2): qty 2

## 2020-08-17 MED ORDER — ACETAMINOPHEN 325 MG PO TABS
650.0000 mg | ORAL_TABLET | Freq: Four times a day (QID) | ORAL | Status: DC | PRN
  Administered 2020-08-17 (×2): 650 mg via ORAL
  Filled 2020-08-17: qty 2

## 2020-08-17 NOTE — Progress Notes (Signed)
ANTICOAGULATION CONSULT NOTE - Follow Up Consult  Pharmacy Consult for lovenox Indication: atrial fibrillation  Allergies  Allergen Reactions  . Atorvastatin Hives and Rash  . Rocephin [Ceftriaxone Sodium In Dextrose] Other (See Comments)    Recurrent seizures    Patient Measurements: Height: 5\' 6"  (167.6 cm) Weight: 68.9 kg (151 lb 14.4 oz) IBW/kg (Calculated) : 63.8   Vital Signs: Temp: 97.7 F (36.5 C) (05/08 0411) Temp Source: Oral (05/08 0411) BP: 145/85 (05/08 0411) Pulse Rate: 67 (05/08 0411)  Labs: Recent Labs    08/16/20 1847 08/17/20 0347  HGB 12.4* 11.5*  HCT 41.1 37.6*  PLT 165 146*  APTT 30  --   LABPROT 15.8*  --   INR 1.3*  --   CREATININE 1.66* 1.24    Estimated Creatinine Clearance: 31.4 mL/min (by C-G formula based on SCr of 1.24 mg/dL).   Medications:  Medications Prior to Admission  Medication Sig Dispense Refill Last Dose  . acetaminophen (TYLENOL) 500 MG tablet Take 1,000 mg by mouth every 6 (six) hours as needed for moderate pain or headache.   Past Week at Unknown time  . albuterol (PROVENTIL) (2.5 MG/3ML) 0.083% nebulizer solution Take 2.5 mg by nebulization every 6 (six) hours as needed for wheezing or shortness of breath.   Past Week at Unknown time  . albuterol (VENTOLIN HFA) 108 (90 Base) MCG/ACT inhaler Inhale 2 puffs into the lungs every 6 (six) hours as needed for wheezing or shortness of breath.   Past Week at Unknown time  . apixaban (ELIQUIS) 2.5 MG TABS tablet Take 1 tablet (2.5 mg total) by mouth 2 (two) times daily. 180 tablet 3 08/16/2020 at 0730  . carvedilol (COREG) 6.25 MG tablet Take 1 tablet (6.25 mg total) by mouth 2 (two) times daily with a meal. 180 tablet 3 08/16/2020 at 0730  . donepezil (ARICEPT) 10 MG tablet Take 1 tablet (10 mg total) by mouth at bedtime. 90 tablet 3 08/15/2020 at Unknown time  . febuxostat (ULORIC) 40 MG tablet Take 1 tablet (40 mg total) by mouth daily. 90 tablet 3 08/16/2020 at Unknown time  .  fluticasone (FLONASE) 50 MCG/ACT nasal spray Place 1 spray into both nostrils 2 (two) times daily as needed for allergies or rhinitis. SPRAY 1 SPRAY IN EACH NOSTRIL ONCE DAILY. 16 g 2 Past Month at Unknown time  . furosemide (LASIX) 20 MG tablet Take 1 tablet (20 mg total) by mouth daily as needed (signs of volume overload). 30 tablet 2 PRN  . gabapentin (NEURONTIN) 400 MG capsule Take 1 capsule (400 mg total) by mouth 2 (two) times daily. 60 capsule 5 08/16/2020 at Unknown time  . Ipratropium-Albuterol (COMBIVENT RESPIMAT) 20-100 MCG/ACT AERS respimat Inhale 1 puff into the lungs every 6 (six) hours as needed for wheezing or shortness of breath (As needed for wheezing , cough or shortness of breath). (Patient taking differently: Inhale 2 puffs into the lungs daily as needed for shortness of breath or wheezing.) 4 g 0 Past Week at Unknown time  . MYRBETRIQ 25 MG TB24 tablet TAKE 1 TABLET DAILY AS DIRECTED (Patient taking differently: Take 25 mg by mouth daily.) 30 tablet 3 08/16/2020 at Unknown time  . pantoprazole (PROTONIX) 40 MG tablet Take 1 tablet (40 mg total) by mouth daily. 90 tablet 3 08/16/2020 at Unknown time  . sertraline (ZOLOFT) 50 MG tablet Take 1 tablet (50 mg total) by mouth daily. 90 tablet 3 08/16/2020 at Unknown time  . simvastatin (ZOCOR) 10 MG  tablet Take 1 tablet (10 mg total) by mouth daily at 6 PM. 90 tablet 3 08/15/2020 at Unknown time   Scheduled:  . aspirin EC  81 mg Oral Daily  . carvedilol  6.25 mg Oral BID WC  . donepezil  10 mg Oral QHS  . enoxaparin (LOVENOX) injection  70 mg Subcutaneous Q12H  . febuxostat  40 mg Oral Daily  . folic acid  1 mg Oral Daily  . gabapentin  400 mg Oral BID  . mirabegron ER  25 mg Oral Daily  . potassium chloride  20 mEq Oral BID  . sertraline  50 mg Oral Daily  . simvastatin  10 mg Oral q1800  . thiamine injection  100 mg Intravenous Daily   Infusions:  . sodium chloride 75 mL/hr at 08/17/20 0959  . levETIRAcetam 500 mg (08/17/20 1013)    PRN: albuterol, fluticasone, Ipratropium-Albuterol, labetalol, LORazepam Anti-infectives (From admission, onward)   Start     Dose/Rate Route Frequency Ordered Stop   08/18/20 1800  levofloxacin (LEVAQUIN) IVPB 750 mg  Status:  Discontinued        750 mg 100 mL/hr over 90 Minutes Intravenous Every 48 hours 08/16/20 1930 08/16/20 2345   08/16/20 1900  levofloxacin (LEVAQUIN) IVPB 750 mg        750 mg 100 mL/hr over 90 Minutes Intravenous  Once 08/16/20 1855 08/16/20 2143      Assessment: 89 YOM with afib requiring anticoagulation with LMWH per MD.  Goal of Therapy:  Anti-Xa level 0.6-1 units/ml 4hrs after LMWH dose given Monitor platelets by anticoagulation protocol: Yes   Plan:  Lovenox 70mg  subq q12h  Monitor CBC MWF  Monitor for signs and symptoms of bleeding   Donna Christen Yamilett Anastos 08/17/2020,11:21 AM

## 2020-08-17 NOTE — Progress Notes (Signed)
*  PRELIMINARY RESULTS* Echocardiogram 2D Echocardiogram has been performed.  Scott [name] 08/17/2020, 11:48 AM

## 2020-08-17 NOTE — Progress Notes (Signed)
Nurse called to room. Pt found coughing. Sitter informed nurse she was assisting him with eating his lunch and he wasn't able to swallow the vegetables well and thinks he was having difficulty swallowing. Pt took PO med's this morning without difficulty. Bed side nursing swallow test performed with no difficulty. Pt tolerated water and Gingerale and he was able to take po medication this afternoon without difficulty. Pt did have some coughing afterwards but otherwise seemed ok. MD informed. Diet changed to Dysphagia 1 diet speech consulted to be on the side of caution. Will continue to monitor patient and perform frequent roundings.

## 2020-08-17 NOTE — Progress Notes (Signed)
PROGRESS NOTE    Scott [name]  MEQ:[id number] DOB: [birth year] DOA: 08/16/2020 PCP: Dettinger, Fransisca Kaufmann, MD   Brief Narrative:   Scott [name] is a 87 y.o. male with a history of paroxysmal atrial fibrillation, coronary artery disease, nonischemic cardiomyopathy, COPD, peripheral artery disease, status post left AKA, hypertension, history of PE and DVT currently on Eliquis, systolic and diastolic heart failure with last echo in 9/21 with an EF of 30%, Alzheimer's.  As patient has Alzheimer's, he is unable to provide history and the history is provided by son and caregiver.  Patient presents with 4 witnessed seizure-like activities.  His seizure activity appears to be new onset and he has been started on IV Keppra.  Further plans per neurology evaluation as well as MRI brain pending for 5/9.  Assessment & Plan:   Principal Problem:   Seizure ([place]) Active Problems:   CAD- non obstructive disease by cath 3/14   DVT of lower extremity, bilateral (HCC)   History of pulmonary embolism (2014)   History of non-ST elevation myocardial infarction (NSTEMI)   PAD (peripheral artery disease) ([place])   Status post above-knee amputation of left lower extremity (HCC)   Malignant neoplasm of lateral wall of urinary bladder (HCC)   Essential hypertension   Paroxysmal atrial fibrillation (HCC)   Alzheimer disease (HCC)   Acute on chronic systolic CHF (congestive heart failure) ([place])   New onset seizures-currently postictal -Started on Keppra -Brain MRI for 5/9 -Neurology evaluation 5/9 -EEG ordered and pending -Continue IVF until awake and alert  Mild, acute hypoxemic respiratory failure -Continue monitoring and wean as tolerated -Chest x-ray with no acute findings -BNP stable -2D echo pending with hx of systolic CHF  CAD/hx of NSTEMI -Labetalol IV as needed -Continue home meds as tolerated -Aspirin  PAF/DVT/PE -Eliquis to heparin drip as pt cannot tolerate PO -Rate controlled  Hx of  Alzheimer's dementia -Has caretakers at home  PAD -Prior L AKA  DVT prophylaxis:Eliquis to Lovenox bid until awake and alert Code Status: DNR Family Communication: Niece at bedside; spoke with son on phone 5/8 Disposition Plan:  Status is: Inpatient  Remains inpatient appropriate because:Altered mental status, Ongoing diagnostic testing needed not appropriate for outpatient work up, IV treatments appropriate due to intensity of illness or inability to take PO and Inpatient level of care appropriate due to severity of illness   Dispo: The patient is from: Home              Anticipated d/c is to: Home              Patient currently is not medically stable to d/c.   Difficult to place patient No  Consultants:   Neurology  Procedures:   See below  Antimicrobials:  Anti-infectives (From admission, onward)   Start     Dose/Rate Route Frequency Ordered Stop   08/18/20 1800  levofloxacin (LEVAQUIN) IVPB 750 mg  Status:  Discontinued        750 mg 100 mL/hr over 90 Minutes Intravenous Every 48 hours 08/16/20 1930 08/16/20 2345   08/16/20 1900  levofloxacin (LEVAQUIN) IVPB 750 mg        750 mg 100 mL/hr over 90 Minutes Intravenous  Once 08/16/20 1855 08/16/20 2143       Subjective: Patient seen and evaluated today and is somnolent and unarousable.  Objective: Vitals:   08/16/20 2300 08/16/20 2346 08/16/20 2355 08/17/20 0411  BP: (!) 144/80  (!) 148/85 (!) 145/85  Pulse: 65  64 67  Resp: 18  18 18   Temp: 98.2 F (36.8 C)  98.2 F (36.8 C) 97.7 F (36.5 C)  TempSrc: Oral  Oral Oral  SpO2: 95% 98% 98% 98%  Weight:      Height:        Intake/Output Summary (Last 24 hours) at 08/17/2020 1058 Last data filed at 08/17/2020 0305 Gross per 24 hour  Intake 332.17 ml  Output 500 ml  Net -167.83 ml   Filed Weights   08/16/20 1843  Weight: 68.9 kg    Examination:  General exam: Unresponsive Respiratory system: Clear to auscultation. Respiratory effort normal. On  [place]. Cardiovascular system: S1 & S2 heard, RRR.  Gastrointestinal system: Abdomen is soft Central nervous system: Unresponsive Extremities: No edema, L AKA Skin: No significant lesions noted Psychiatry: Cannot be assessed    Data Reviewed: I have personally reviewed following labs and imaging studies  CBC: Recent Labs  Lab 08/16/20 1847 08/17/20 0347  WBC 4.0 4.4  NEUTROABS 2.6  --   HGB 12.4* 11.5*  HCT 41.1 37.6*  MCV 88.2 87.0  PLT 165 338*   Basic Metabolic Panel: Recent Labs  Lab 08/16/20 1847 08/17/20 0347  NA 141 140  K 4.1 4.2  CL 105 102  CO2 29 31  GLUCOSE 137* 86  BUN 38* 33*  CREATININE 1.66* 1.24  CALCIUM 9.4 9.0   GFR: Estimated Creatinine Clearance: 31.4 mL/min (by C-G formula based on SCr of 1.24 mg/dL). Liver Function Tests: Recent Labs  Lab 08/16/20 1847  AST 21  ALT 13  ALKPHOS 138*  BILITOT 0.3  PROT 7.3  ALBUMIN 3.8   No results for input(s): LIPASE, AMYLASE in the last 168 hours. No results for input(s): AMMONIA in the last 168 hours. Coagulation Profile: Recent Labs  Lab 08/16/20 1847  INR 1.3*   Cardiac Enzymes: No results for input(s): CKTOTAL, CKMB, CKMBINDEX, TROPONINI in the last 168 hours. BNP (last 3 results) No results for input(s): PROBNP in the last 8760 hours. HbA1C: No results for input(s): HGBA1C in the last 72 hours. CBG: Recent Labs  Lab 08/16/20 1854  GLUCAP 120*   Lipid Profile: No results for input(s): CHOL, HDL, LDLCALC, TRIG, CHOLHDL, LDLDIRECT in the last 72 hours. Thyroid Function Tests: No results for input(s): TSH, T4TOTAL, FREET4, T3FREE, THYROIDAB in the last 72 hours. Anemia Panel: No results for input(s): VITAMINB12, FOLATE, FERRITIN, TIBC, IRON, RETICCTPCT in the last 72 hours. Sepsis Labs: Recent Labs  Lab 08/16/20 1901 08/16/20 2038  PROCALCITON  --  <0.10  LATICACIDVEN 2.4* 1.7    Recent Results (from the past 240 hour(s))  Blood Culture (routine x 2)     Status: None  (Preliminary result)   Collection Time: 08/16/20  7:14 PM   Specimen: BLOOD RIGHT FOREARM  Result Value Ref Range Status   Specimen Description BLOOD RIGHT FOREARM  Final   Special Requests   Final    Blood Culture results may not be optimal due to an inadequate volume of blood received in culture bottles BOTTLES DRAWN AEROBIC AND ANAEROBIC   Culture   Final    NO GROWTH < 12 HOURS Performed at [facility], [address] [place], [place] [address]    Report Status PENDING  Incomplete  Blood Culture (routine x 2)     Status: None (Preliminary result)   Collection Time: 08/16/20  7:14 PM   Specimen: BLOOD RIGHT HAND  Result Value Ref Range Status   Specimen Description BLOOD RIGHT  HAND  Final   Special Requests   Final    Blood Culture results may not be optimal due to an inadequate volume of blood received in culture bottles BOTTLES DRAWN AEROBIC AND ANAEROBIC   Culture   Final    NO GROWTH < 12 HOURS Performed at [facility], [address], [place], [place] [address]    Report Status PENDING  Incomplete  Resp Panel by RT-PCR (Flu A&B, Covid) Nasopharyngeal Swab     Status: None   Collection Time: 08/16/20  8:20 PM   Specimen: Nasopharyngeal Swab; Nasopharyngeal(NP) swabs in vial transport medium  Result Value Ref Range Status   SARS Coronavirus 2 by RT PCR NEGATIVE NEGATIVE Final    Comment: (NOTE) SARS-CoV-2 target nucleic acids are NOT DETECTED.  The SARS-CoV-2 RNA is generally detectable in upper respiratory specimens during the acute phase of infection. The lowest concentration of SARS-CoV-2 viral copies this assay can detect is 138 copies/mL. A negative result does not preclude SARS-Cov-2 infection and should not be used as the sole basis for treatment or other patient management decisions. A negative result may occur with  improper specimen collection/handling, submission of specimen other than nasopharyngeal swab, presence of viral mutation(s) within the areas  targeted by this assay, and inadequate number of viral copies(<138 copies/mL). A negative result must be combined with clinical observations, patient history, and epidemiological information. The expected result is Negative.  Fact Sheet for Patients:  EntrepreneurPulse.com.au  Fact Sheet for Healthcare Providers:  IncredibleEmployment.be  This test is no t yet approved or cleared by the [place] FDA and  has been authorized for detection and/or diagnosis of SARS-CoV-2 by FDA under an Emergency Use Authorization (EUA). This EUA will remain  in effect (meaning this test can be used) for the duration of the COVID-19 declaration under Section 564(b)(1) of the Act, 21 U.S.C.section 360bbb-3(b)(1), unless the authorization is terminated  or revoked sooner.       Influenza A by PCR NEGATIVE NEGATIVE Final   Influenza B by PCR NEGATIVE NEGATIVE Final    Comment: (NOTE) The Xpert Xpress SARS-CoV-2/FLU/RSV plus assay is intended as an aid in the diagnosis of influenza from Nasopharyngeal swab specimens and should not be used as a sole basis for treatment. Nasal washings and aspirates are unacceptable for Xpert Xpress SARS-CoV-2/FLU/RSV testing.  Fact Sheet for Patients: EntrepreneurPulse.com.au  Fact Sheet for Healthcare Providers: IncredibleEmployment.be  This test is not yet approved or cleared by the [place] FDA and has been authorized for detection and/or diagnosis of SARS-CoV-2 by FDA under an Emergency Use Authorization (EUA). This EUA will remain in effect (meaning this test can be used) for the duration of the COVID-19 declaration under Section 564(b)(1) of the Act, 21 U.S.C. section 360bbb-3(b)(1), unless the authorization is terminated or revoked.  Performed at [facility], [address] [place], [place] [address]          Radiology Studies: CT Head Wo Contrast  Result Date:  08/16/2020 CLINICAL DATA:  87 year old male with seizure. EXAM: CT HEAD WITHOUT CONTRAST TECHNIQUE: Contiguous axial images were obtained from the base of the skull through the vertex without intravenous contrast. COMPARISON:  Head CT dated 05/18/2020. FINDINGS: Brain: Moderate age-related atrophy and chronic microvascular ischemic changes. Focal area of old infarct and encephalomalacia involving the left basal ganglia. Cavum septum posterior and cavum vergae. There is no acute intracranial hemorrhage. No mass effect or midline shift. No extra-axial fluid collection. Vascular: No hyperdense vessel or unexpected calcification. Skull:  Normal. Negative for fracture or focal lesion. Sinuses/Orbits: There is diffuse mucoperiosteal thickening of paranasal sinuses with opacification of left maxillary sinus. The mastoid air cells are clear. Other: None IMPRESSION: 1. No acute intracranial pathology. 2. Moderate age-related atrophy and chronic microvascular ischemic changes. Old left basal ganglia infarct. 3. Paranasal sinus disease. Electronically Signed   By: Anner Crete M.D.   On: 08/16/2020 20:10   DG Chest Port 1 View  Result Date: 08/16/2020 CLINICAL DATA:  87 year old male with sepsis. EXAM: PORTABLE CHEST 1 VIEW COMPARISON:  Chest radiograph dated 05/18/2020 and CT dated 07/04/2018. FINDINGS: There is diffuse chronic interstitial coarsening. There are bibasilar atelectasis/scarring. No new consolidation, pleural effusion, or pneumothorax. Stable cardiomegaly. Atherosclerotic calcification of the aorta. No acute osseous pathology. IMPRESSION: No interval change.  No new consolidation. Electronically Signed   By: Anner Crete M.D.   On: 08/16/2020 19:30        Scheduled Meds: . apixaban  2.5 mg Oral BID  . aspirin EC  81 mg Oral Daily  . carvedilol  6.25 mg Oral BID WC  . donepezil  10 mg Oral QHS  . febuxostat  40 mg Oral Daily  . folic acid  1 mg Oral Daily  . gabapentin  400 mg Oral BID  .  mirabegron ER  25 mg Oral Daily  . pantoprazole  40 mg Oral Daily  . potassium chloride  20 mEq Oral BID  . sertraline  50 mg Oral Daily  . simvastatin  10 mg Oral q1800  . thiamine  100 mg Oral Daily   Continuous Infusions: . sodium chloride 75 mL/hr at 08/17/20 0959  . levETIRAcetam 500 mg (08/17/20 1013)     LOS: 1 day    Time spent: 35 minutes    Helana Macbride Darleen Crocker, DO [facility]  If 7PM-7AM, please contact night-coverage [email or web address] 08/17/2020, 10:58 AM

## 2020-08-18 ENCOUNTER — Ambulatory Visit: Payer: Medicare Other | Admitting: Family Medicine

## 2020-08-18 ENCOUNTER — Inpatient Hospital Stay (HOSPITAL_COMMUNITY): Payer: Medicare Other

## 2020-08-18 DIAGNOSIS — R569 Unspecified convulsions: Secondary | ICD-10-CM | POA: Diagnosis not present

## 2020-08-18 LAB — URINE CULTURE: Culture: NO GROWTH

## 2020-08-18 LAB — BASIC METABOLIC PANEL
Anion gap: 5 (ref 5–15)
BUN: 28 mg/dL — ABNORMAL HIGH (ref 8–23)
CO2: 29 mmol/L (ref 22–32)
Calcium: 9.2 mg/dL (ref 8.9–10.3)
Chloride: 107 mmol/L (ref 98–111)
Creatinine, Ser: 1.26 mg/dL — ABNORMAL HIGH (ref 0.61–1.24)
GFR, Estimated: 52 mL/min — ABNORMAL LOW (ref >60)
Glucose, Bld: 86 mg/dL (ref 70–99)
Potassium: 4.5 mmol/L (ref 3.5–5.1)
Sodium: 141 mmol/L (ref 135–145)

## 2020-08-18 LAB — CBC
HCT: 37.7 % — ABNORMAL LOW (ref 39.0–52.0)
Hemoglobin: 11.4 g/dL — ABNORMAL LOW (ref 13.0–17.0)
MCH: 26.8 pg (ref 26.0–34.0)
MCHC: 30.2 g/dL (ref 30.0–36.0)
MCV: 88.5 fL (ref 80.0–100.0)
Platelets: 132 10*3/uL — ABNORMAL LOW (ref 150–400)
RBC: 4.26 MIL/uL (ref 4.22–5.81)
RDW: 17.3 % — ABNORMAL HIGH (ref 11.5–15.5)
WBC: 3.2 10*3/uL — ABNORMAL LOW (ref 4.0–10.5)
nRBC: 0 % (ref 0.0–0.2)

## 2020-08-18 LAB — MAGNESIUM: Magnesium: 1.9 mg/dL (ref 1.7–2.4)

## 2020-08-18 LAB — PROTIME-INR
INR: 1.5 — ABNORMAL HIGH (ref 0.8–1.2)
Prothrombin Time: 17.6 seconds — ABNORMAL HIGH (ref 11.4–15.2)

## 2020-08-18 MED ORDER — APIXABAN 2.5 MG PO TABS
2.5000 mg | ORAL_TABLET | Freq: Two times a day (BID) | ORAL | Status: DC
  Administered 2020-08-18 – 2020-08-19 (×3): 2.5 mg via ORAL
  Filled 2020-08-18 (×3): qty 1

## 2020-08-18 MED ORDER — PANTOPRAZOLE SODIUM 40 MG PO TBEC
40.0000 mg | DELAYED_RELEASE_TABLET | Freq: Every day | ORAL | Status: DC
  Administered 2020-08-18 – 2020-08-19 (×2): 40 mg via ORAL
  Filled 2020-08-18 (×2): qty 1

## 2020-08-18 NOTE — Progress Notes (Signed)
PROGRESS NOTE    Scott [name]  [id number] DOB: May 09, 1921 DOA: 08/16/2020 PCP: Dettinger, Fransisca Kaufmann, MD   Brief Narrative:   Scott [name] a 87 y.o.malewith a history of paroxysmal atrial fibrillation, coronary artery disease, nonischemic cardiomyopathy, COPD, peripheral artery disease, status post leftAKA, hypertension, history of PE and DVT currently on Eliquis,systolic and diastolic heart failure with last echo in 9/21 with an EF of 30%, Alzheimer's. As patient has Alzheimer's, he is unable to provide history and the history is provided by son and caregiver. Patient presents with 4 witnessed seizure-like activities.  His seizure activity appears to be new onset and he has been started on IV Keppra.  Further plans per neurology evaluation as well as MRI brain pending for 5/9.  Assessment & Plan:   Principal Problem:   Seizure ([facility]) Active Problems:   CAD- non obstructive disease by cath 3/14   DVT of lower extremity, bilateral (HCC)   History of pulmonary embolism (2014)   History of non-ST elevation myocardial infarction (NSTEMI)   PAD (peripheral artery disease) ([facility])   Status post above-knee amputation of left lower extremity (HCC)   Malignant neoplasm of lateral wall of urinary bladder (HCC)   Essential hypertension   Paroxysmal atrial fibrillation (HCC)   Alzheimer disease (HCC)   Acute on chronic systolic CHF (congestive heart failure) ([facility])   New onset seizures-currently postictal -Started on Keppra -Brain MRI for 5/9 -Neurology evaluation 5/9 -EEG ordered and pending -SLP evaluation pending -Dys 1 diet for now  Chronic hypoxemic respiratory failure -Continue monitoring and wean as tolerated -Chest x-ray with no acute findings -BNP stable  CAD/hx of NSTEMI now with worsening cardiomyopathy -LVEF now 15-20% compared to 35% previously; discussed with Cardiology and pt is on optimal medical management. No further need for evaluation given advanced  age -Labetalol IV as needed -Continue home meds as tolerated -Aspirin  PAF/DVT/PE -Eliquis resumed -Rate controlled  Hx of Alzheimer's dementia -Has caretakers at home -Plan is to go home per son  PAD -Prior L AKA  DVT prophylaxis:Eliquis to resume 5/9 Code Status: DNR Family Communication: Spoke with son at bedside 5/9 Disposition Plan:  Status is: Inpatient  Remains inpatient appropriate because:Altered mental status, Ongoing diagnostic testing needed not appropriate for outpatient work up, IV treatments appropriate due to intensity of illness or inability to take PO and Inpatient level of care appropriate due to severity of illness   Dispo: The patient is from: Home  Anticipated d/c is to: Home  Patient currently is not medically stable to d/c.              Difficult to place patient No  Consultants:   Neurology  Procedures:   See below  Antimicrobials:  Anti-infectives (From admission, onward)   Start     Dose/Rate Route Frequency Ordered Stop   08/18/20 1800  levofloxacin (LEVAQUIN) IVPB 750 mg  Status:  Discontinued        750 mg 100 mL/hr over 90 Minutes Intravenous Every 48 hours 08/16/20 1930 08/16/20 2345   08/16/20 1900  levofloxacin (LEVAQUIN) IVPB 750 mg        750 mg 100 mL/hr over 90 Minutes Intravenous  Once 08/16/20 1855 08/16/20 2143       Subjective: Patient seen and evaluated today with no new acute complaints or concerns. No acute concerns or events noted overnight.  Objective: Vitals:   08/18/20 0030 08/18/20 0403 08/18/20 0405 08/18/20 0500  BP: 119/79 (!) 147/92    Pulse:  65 66    Resp:   18   Temp: (!) 97.5 F (36.4 C) 98 F (36.7 C)    TempSrc: Oral Oral    SpO2: 95% 99%    Weight:    66.1 kg  Height:        Intake/Output Summary (Last 24 hours) at 08/18/2020 1115 Last data filed at 08/18/2020 0900 Gross per 24 hour  Intake 1394.4 ml  Output 350 ml  Net 1044.4 ml   Filed Weights    08/16/20 1843 08/18/20 0500  Weight: 68.9 kg 66.1 kg    Examination:  General exam: Appears calm and comfortable  Respiratory system: Clear to auscultation. Respiratory effort normal. On 2L [place] Cardiovascular system: S1 & S2 heard, RRR.  Gastrointestinal system: Abdomen is soft Central nervous system: Alert and awake Extremities: No edema, L AKA Skin: No significant lesions noted Psychiatry: Flat affect.    Data Reviewed: I have personally reviewed following labs and imaging studies  CBC: Recent Labs  Lab 08/16/20 1847 08/17/20 0347 08/18/20 0826  WBC 4.0 4.4 3.2*  NEUTROABS 2.6  --   --   HGB 12.4* 11.5* 11.4*  HCT 41.1 37.6* 37.7*  MCV 88.2 87.0 88.5  PLT 165 146* 983*   Basic Metabolic Panel: Recent Labs  Lab 08/16/20 1847 08/17/20 0347 08/18/20 0826  NA 141 140 141  K 4.1 4.2 4.5  CL 105 102 107  CO2 29 31 29   GLUCOSE 137* 86 86  BUN 38* 33* 28*  CREATININE 1.66* 1.24 1.26*  CALCIUM 9.4 9.0 9.2  MG  --   --  1.9   GFR: Estimated Creatinine Clearance: 30.9 mL/min (A) (by C-G formula based on SCr of 1.26 mg/dL (H)). Liver Function Tests: Recent Labs  Lab 08/16/20 1847  AST 21  ALT 13  ALKPHOS 138*  BILITOT 0.3  PROT 7.3  ALBUMIN 3.8   No results for input(s): LIPASE, AMYLASE in the last 168 hours. No results for input(s): AMMONIA in the last 168 hours. Coagulation Profile: Recent Labs  Lab 08/16/20 1847 08/18/20 0826  INR 1.3* 1.5*   Cardiac Enzymes: No results for input(s): CKTOTAL, CKMB, CKMBINDEX, TROPONINI in the last 168 hours. BNP (last 3 results) No results for input(s): PROBNP in the last 8760 hours. HbA1C: No results for input(s): HGBA1C in the last 72 hours. CBG: Recent Labs  Lab 08/16/20 1854  GLUCAP 120*   Lipid Profile: No results for input(s): CHOL, HDL, LDLCALC, TRIG, CHOLHDL, LDLDIRECT in the last 72 hours. Thyroid Function Tests: No results for input(s): TSH, T4TOTAL, FREET4, T3FREE, THYROIDAB in the last 72  hours. Anemia Panel: No results for input(s): VITAMINB12, FOLATE, FERRITIN, TIBC, IRON, RETICCTPCT in the last 72 hours. Sepsis Labs: Recent Labs  Lab 08/16/20 1901 08/16/20 2038  PROCALCITON  --  <0.10  LATICACIDVEN 2.4* 1.7    Recent Results (from the past 240 hour(s))  Blood Culture (routine x 2)     Status: None (Preliminary result)   Collection Time: 08/16/20  7:14 PM   Specimen: BLOOD RIGHT FOREARM  Result Value Ref Range Status   Specimen Description BLOOD RIGHT FOREARM  Final   Special Requests   Final    Blood Culture results may not be optimal due to an inadequate volume of blood received in culture bottles BOTTLES DRAWN AEROBIC AND ANAEROBIC   Culture   Final    NO GROWTH 2 DAYS Performed at [facility], [address] [place],  [address]    Report  Status PENDING  Incomplete  Blood Culture (routine x 2)     Status: None (Preliminary result)   Collection Time: 08/16/20  7:14 PM   Specimen: BLOOD RIGHT HAND  Result Value Ref Range Status   Specimen Description BLOOD RIGHT HAND  Final   Special Requests   Final    Blood Culture results may not be optimal due to an inadequate volume of blood received in culture bottles BOTTLES DRAWN AEROBIC AND ANAEROBIC   Culture   Final    NO GROWTH 2 DAYS Performed at [facility], [address], [place], [place] [address]    Report Status PENDING  Incomplete  Resp Panel by RT-PCR (Flu A&B, Covid) Nasopharyngeal Swab     Status: None   Collection Time: 08/16/20  8:20 PM   Specimen: Nasopharyngeal Swab; Nasopharyngeal(NP) swabs in vial transport medium  Result Value Ref Range Status   SARS Coronavirus 2 by RT PCR NEGATIVE NEGATIVE Final    Comment: (NOTE) SARS-CoV-2 target nucleic acids are NOT DETECTED.  The SARS-CoV-2 RNA is generally detectable in upper respiratory specimens during the acute phase of infection. The lowest concentration of SARS-CoV-2 viral copies this assay can detect is 138 copies/mL. A negative  result does not preclude SARS-Cov-2 infection and should not be used as the sole basis for treatment or other patient management decisions. A negative result may occur with  improper specimen collection/handling, submission of specimen other than nasopharyngeal swab, presence of viral mutation(s) within the areas targeted by this assay, and inadequate number of viral copies(<138 copies/mL). A negative result must be combined with clinical observations, patient history, and epidemiological information. The expected result is Negative.  Fact Sheet for Patients:  EntrepreneurPulse.com.au  Fact Sheet for Healthcare Providers:  IncredibleEmployment.be  This test is no t yet approved or cleared by the [place] FDA and  has been authorized for detection and/or diagnosis of SARS-CoV-2 by FDA under an Emergency Use Authorization (EUA). This EUA will remain  in effect (meaning this test can be used) for the duration of the COVID-19 declaration under Section 564(b)(1) of the Act, 21 U.S.C.section 360bbb-3(b)(1), unless the authorization is terminated  or revoked sooner.       Influenza A by PCR NEGATIVE NEGATIVE Final   Influenza B by PCR NEGATIVE NEGATIVE Final    Comment: (NOTE) The Xpert Xpress SARS-CoV-2/FLU/RSV plus assay is intended as an aid in the diagnosis of influenza from Nasopharyngeal swab specimens and should not be used as a sole basis for treatment. Nasal washings and aspirates are unacceptable for Xpert Xpress SARS-CoV-2/FLU/RSV testing.  Fact Sheet for Patients: EntrepreneurPulse.com.au  Fact Sheet for Healthcare Providers: IncredibleEmployment.be  This test is not yet approved or cleared by the [place] FDA and has been authorized for detection and/or diagnosis of SARS-CoV-2 by FDA under an Emergency Use Authorization (EUA). This EUA will remain in effect (meaning this test can be used)  for the duration of the COVID-19 declaration under Section 564(b)(1) of the Act, 21 U.S.C. section 360bbb-3(b)(1), unless the authorization is terminated or revoked.  Performed at [facility], [address], [place], [place] [address]   Urine culture     Status: None   Collection Time: 08/16/20 10:30 PM   Specimen: Urine, Clean Catch  Result Value Ref Range Status   Specimen Description   Final    URINE, CLEAN CATCH Performed at [facility], [address] [place], [place] [address]    Special Requests   Final  NONE Performed at [facility], [address], [place], [place] [address]    Culture   Final    NO GROWTH Performed at [facility], [address], [place], [place] [address]    Report Status 08/18/2020 FINAL  Final         Radiology Studies: CT Head Wo Contrast  Result Date: 08/16/2020 CLINICAL DATA:  87 year old male with seizure. EXAM: CT HEAD WITHOUT CONTRAST TECHNIQUE: Contiguous axial images were obtained from the base of the skull through the vertex without intravenous contrast. COMPARISON:  Head CT dated 05/18/2020. FINDINGS: Brain: Moderate age-related atrophy and chronic microvascular ischemic changes. Focal area of old infarct and encephalomalacia involving the left basal ganglia. Cavum septum posterior and cavum vergae. There is no acute intracranial hemorrhage. No mass effect or midline shift. No extra-axial fluid collection. Vascular: No hyperdense vessel or unexpected calcification. Skull: Normal. Negative for fracture or focal lesion. Sinuses/Orbits: There is diffuse mucoperiosteal thickening of paranasal sinuses with opacification of left maxillary sinus. The mastoid air cells are clear. Other: None IMPRESSION: 1. No acute intracranial pathology. 2. Moderate age-related atrophy and chronic microvascular ischemic changes. Old left basal ganglia infarct. 3. Paranasal sinus disease. Electronically Signed   By: Anner Crete M.D.   On: 08/16/2020  20:10   DG Chest Port 1 View  Result Date: 08/16/2020 CLINICAL DATA:  87 year old male with sepsis. EXAM: PORTABLE CHEST 1 VIEW COMPARISON:  Chest radiograph dated 05/18/2020 and CT dated 07/04/2018. FINDINGS: There is diffuse chronic interstitial coarsening. There are bibasilar atelectasis/scarring. No new consolidation, pleural effusion, or pneumothorax. Stable cardiomegaly. Atherosclerotic calcification of the aorta. No acute osseous pathology. IMPRESSION: No interval change.  No new consolidation. Electronically Signed   By: Anner Crete M.D.   On: 08/16/2020 19:30   ECHOCARDIOGRAM COMPLETE  Result Date: 08/17/2020    ECHOCARDIOGRAM REPORT   Patient Name:   Scott [name] Hosking Date of Exam: 08/17/2020 Medical Rec #:  [id number]     Height:       66.0 in Accession #:    [id number]    Weight:       151.9 lb Date of Birth:  [birth year]      BSA:          1.779 m Patient Age:    70 years      BP:           145/83 mmHg Patient Gender: M             HR:           67 bpm. Exam Location:  [facility] Procedure: 2D Echo, Cardiac Doppler and Color Doppler Indications:    CHF-Acute Systolic E93.81  History:        Patient has prior history of Echocardiogram examinations, most                 recent 12/21/2019. CHF, CAD and Previous Myocardial Infarction,                 Arrythmias:Atrial Fibrillation, Signs/Symptoms:Syncope; Risk                 Factors:Former Smoker, Dyslipidemia and Hypertension. Alzheimer                 disease , History of Pulmonary Embolism.  Sonographer:    Alvino Chapel RCS Referring Phys: [place]  1. Left ventricular ejection fraction, by estimation, is 15-20%. The left ventricle has severely decreased function. The  left ventricle demonstrates global hypokinesis. There is mild left ventricular hypertrophy. Left ventricular diastolic parameters are indeterminate.  2. Right ventricular systolic function is severely reduced. The right ventricular size is mildly enlarged. There  is severely elevated pulmonary artery systolic pressure. The estimated right ventricular systolic pressure is 16.1 mmHg.  3. Left atrial size was mildly dilated.  4. Right atrial size was mildly dilated.  5. The mitral valve is grossly normal. Trivial mitral valve regurgitation. No evidence of mitral stenosis.  6. Tricuspid valve regurgitation is mild to moderate.  7. The aortic valve is grossly normal. There is mild calcification of the aortic valve. Aortic valve regurgitation is not visualized. Mild aortic valve sclerosis is present, with no evidence of aortic valve stenosis.  8. The inferior vena cava is dilated in size with <50% respiratory variability, suggesting right atrial pressure of 15 mmHg. FINDINGS  Left Ventricle: Left ventricular ejection fraction, by estimation, is 15-20%. The left ventricle has severely decreased function. The left ventricle demonstrates global hypokinesis. The left ventricular internal cavity size was normal in size. There is mild left ventricular hypertrophy. Left ventricular diastolic parameters are indeterminate. Right Ventricle: The right ventricular size is mildly enlarged. No increase in right ventricular wall thickness. Right ventricular systolic function is severely reduced. There is severely elevated pulmonary artery systolic pressure. The tricuspid regurgitant velocity is 4.08 m/s, and with an assumed right atrial pressure of 15 mmHg, the estimated right ventricular systolic pressure is 09.6 mmHg. Left Atrium: Left atrial size was mildly dilated. Right Atrium: Right atrial size was mildly dilated. Pericardium: There is no evidence of pericardial effusion. Mitral Valve: The mitral valve is grossly normal. Trivial mitral valve regurgitation. No evidence of mitral valve stenosis. Tricuspid Valve: The tricuspid valve is normal in structure. Tricuspid valve regurgitation is mild to moderate. No evidence of tricuspid stenosis. Aortic Valve: The aortic valve is grossly normal.  There is mild calcification of the aortic valve. Aortic valve regurgitation is not visualized. Mild aortic valve sclerosis is present, with no evidence of aortic valve stenosis. Pulmonic Valve: The pulmonic valve was normal in structure. Pulmonic valve regurgitation is trivial. No evidence of pulmonic stenosis. Aorta: The aortic root is normal in size and structure. Venous: The inferior vena cava is dilated in size with less than 50% respiratory variability, suggesting right atrial pressure of 15 mmHg. IAS/Shunts: No atrial level shunt detected by color flow Doppler.  LEFT VENTRICLE PLAX 2D LVIDd:         5.20 cm      Diastology LVIDs:         4.70 cm      LV e' medial:    3.70 cm/s LV PW:         1.20 cm      LV E/e' medial:  46.2 LV IVS:        1.30 cm      LV e' lateral:   6.85 cm/s LVOT diam:     2.20 cm      LV E/e' lateral: 25.0 LV SV:         56 LV SV Index:   31 LVOT Area:     3.80 cm  LV Volumes (MOD) LV vol d, MOD A2C: 148.0 ml LV vol d, MOD A4C: 154.0 ml LV vol s, MOD A2C: 113.0 ml LV vol s, MOD A4C: 111.0 ml LV SV MOD A2C:     35.0 ml LV SV MOD A4C:     154.0 ml LV SV  MOD BP:      41.3 ml RIGHT VENTRICLE RV S prime:     5.55 cm/s TAPSE (M-mode): 1.4 cm LEFT ATRIUM             Index       RIGHT ATRIUM           Index LA diam:        4.00 cm 2.25 cm/m  RA Area:     19.20 cm LA Vol (A2C):   76.4 ml 42.94 ml/m RA Volume:   55.60 ml  31.25 ml/m LA Vol (A4C):   58.8 ml 33.05 ml/m LA Biplane Vol: 66.9 ml 37.60 ml/m  AORTIC VALVE LVOT Vmax:   73.20 cm/s LVOT Vmean:  39.700 cm/s LVOT VTI:    0.147 m  AORTA Ao Root diam: 3.80 cm MITRAL VALVE                TRICUSPID VALVE MV Area (PHT): 4.60 cm     TR Peak grad:   66.6 mmHg MV Decel Time: 165 msec     TR Vmax:        408.00 cm/s MV E velocity: 171.00 cm/s                             SHUNTS                             Systemic VTI:  0.15 m                             Systemic Diam: 2.20 cm Cherlynn Kaiser MD Electronically signed by Cherlynn Kaiser MD  Signature Date/Time: 08/17/2020/1:30:11 PM    Final         Scheduled Meds: . aspirin EC  81 mg Oral Daily  . carvedilol  6.25 mg Oral BID WC  . donepezil  10 mg Oral QHS  . enoxaparin (LOVENOX) injection  70 mg Subcutaneous Q12H  . febuxostat  40 mg Oral Daily  . folic acid  1 mg Oral Daily  . gabapentin  400 mg Oral BID  . mirabegron ER  25 mg Oral Daily  . potassium chloride  20 mEq Oral BID  . sertraline  50 mg Oral Daily  . simvastatin  10 mg Oral q1800  . thiamine injection  100 mg Intravenous Daily   Continuous Infusions: . levETIRAcetam 500 mg (08/18/20 1049)     LOS: 2 days    Time spent: 35 minutes    Carmilla Granville Darleen Crocker, DO [facility]  If 7PM-7AM, please contact night-coverage [email or web address] 08/18/2020, 11:15 AM

## 2020-08-18 NOTE — Consult Note (Signed)
[place] A. Merlene Laughter, MD     [email or web address]          Scott [name] is an 87 y.o. male.   ASSESSMENT/PLAN: 1. Complex partial status epilepticus: The patient has been starting Keppra which is appropriate. EEG will be obtained. Dose of Keppra is high for the patient's age and therefore the dose will be reduced to  250 mg twice a day.  2. Atrial fibrillation continue Eliquis anticoagulation 3. Prior infarcts involving the left centrum semiovale and also small cortical right parietal which I suspect could be the etiology of the patient's seizures.    The patient [age]-year-old white male who presents with recurrent episodes within 20 minutes. The semiology consists of the patient turning his head to 1 side with eyes deviated and stiffening of the neck. The description is provided by the son who conveniently also had few recordings of the episodes. He actually had 3 recording with 2 of the spells associated with the patient turning his head to the right and eye deviated with stiffening of the neck. One was associated with turning to the left. I did not clearly see clonic activity of the extremities although the son reports that he did have some. The episode lasted for about 60 seconds with the patient having no confusion afterwards.  The son reports that he may have had a spell a week ago which was witnessed by caretaker. There is no previous history to this however. The patient had episodes of unresponsiveness and confusion last year but these events were associated with confusion lasting for hours and seemingly associated with the use of an antibiotic. The patient appears to be back at baseline. 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 The review systems otherwise negative.     GENERAL: This is a pleasant male who is sitting up eating his dinner in  the bed. He is doing well at this time.  HEENT:  Neck is supple no trauma noted.  ABDOMEN: soft  EXTREMITIES: No edema; above knee amputation on the left   BACK: Normal  SKIN: Normal by inspection.    MENTAL STATUS: Alert and oriented X 3. Speech, language and cognition are generally intact. Judgment and insight normal.   CRANIAL NERVES: Pupils are equal, round and reactive to light and accomodation; extra ocular movements are full, there is no significant nystagmus; visual fields are full; upper and lower facial muscles are normal in strength and symmetric, there is no flattening of the nasolabial folds; tongue is midline; uvula is midline; shoulder elevation is normal.  MOTOR: Normal tone, bulk and strength; no pronator drift.  COORDINATION: Left finger to nose is normal, right finger to nose is normal, No rest tremor; no intention tremor; no postural tremor; no bradykinesia.   SENSATION: Normal to light touch and pain.     Blood pressure (!) 142/78, pulse 68, temperature 98.6 F (37 C), temperature source Oral, resp. rate 18, height 5\' 6"  (1.676 m), weight 66.1 kg, SpO2 98 %.  Past Medical History:  Diagnosis Date  . Arthritis    "all over"  . Atrial fibrillation ([place])   . Benign localized prostatic hyperplasia with lower urinary tract symptoms (LUTS)   . Bradycardia 11/28/2015   Severe-HR in the 20s to 30s following intubation for urological procedure.  Marland Kitchen CAD- non obstructive disease by cath 3/14 cardiologist-  dr hochrein  . Chronic lower back pain   . COPD (chronic obstructive pulmonary disease) ([place])   . DDD (degenerative disc disease)   . Depression   . Dyspnea    w/ exertion and lying down (raise head)  . ETOH abuse   . GERD (gastroesophageal reflux disease)   . Glaucoma   . Glaucoma, both eyes   . History of bladder cancer urologist-- dr Jeffie Pollock   first dx 2012--  s/p TURBT's  . History of DVT of lower extremity 03/2013   bilateral   . History of pulmonary  embolus (PE) 04/2013  . HTN (hypertension)   . Hyperlipidemia   . Hypertension   . LBBB (left bundle branch block)    chronic  . Non-ischemic cardiomyopathy ([place])    last echo 01-18-2017, ef 35-40%  . NSVT (nonsustained ventricular tachycardia) ([place])    a. NSVT 06/2012; NSVT also seen during 03/2013 adm. b. Med rx. Not candidate for ICD given adv age.  Marland Kitchen PAD (peripheral artery disease) ([place])   . 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 PVCs (premature ventricular contractions)   . PVD (peripheral vascular disease) ([facility])    Right ABI .75, Left .78 (2006)  . Syncope 06/2012   a. Felt to be postural syncope related to diuretics 06/2012.  Marland Kitchen Systolic and diastolic CHF, chronic (HCC) cardiologsit-  dr hochrein   a. NICM - patent cors 06/2012, EF 40% at that time. b. 03/2013 eval: EF 20-25%.  . Vision loss, left eye    due to glaucoma  . Wears glasses     Past Surgical History:  Procedure Laterality Date  . AMPUTATION Left 10/16/2014   Procedure: AMPUTATION ABOVE KNEE- LEFT;  Surgeon: Mal Misty, MD;  Location: [place];  Service: Vascular;  Laterality: Left;  . CARDIAC CATHETERIZATION  05-11-2004  DR [facility]   MINIMAL CORANARY PLAQUE/ NORMAL LVF/ EF 55%/  NON-OBSTRUCTIVE LAD 25%  . CARDIAC CATHETERIZATION  06/18/2012   dr hochrein   mild luminal irregularities of coronaries, ef 45-50%  . CARDIOVASCULAR STRESS TEST  10-15-2010   LOW RISK NUCLEAR STUDY/ NO EVIDENCE OF ISCHEMIA/ NORMAL EF  . CATARACT EXTRACTION W/ INTRAOCULAR LENS  IMPLANT, BILATERAL Bilateral   . CYSTOSCOPY  10/07/2011   Procedure: CYSTOSCOPY;  Surgeon: Malka So, MD;  Location[facility];  Service: Urology;  Laterality: N/A;  . CYSTOSCOPY N/A 10/20/2018   Procedure: CYSTOSCOPY WITH FULGURATION OF PROSTATIC URETHRAL TUMOR;  Surgeon: [name], MD;  Location[facility];  Service: Urology;  Laterality: N/A;  .  CYSTOSCOPY N/A 10/11/2019   Procedure: CYSTOSCOPY;  Surgeon: Cleon Gustin, MD;  Location[facility];  Service: Urology;  Laterality: N/A;  . CYSTOSCOPY W/ RETROGRADES Bilateral 03/04/2020   Procedure: CYSTOSCOPY WITH BILATERAL RETROGRADE;  Surgeon: [name], MD;  Location[facility];  Service: Urology;  Laterality: Bilateral;  . CYSTOSCOPY WITH BIOPSY  03/14/2012   Procedure: CYSTOSCOPY WITH BIOPSY;  Surgeon: Malka So, MD;  [facility];  Service: Urology;  Laterality: N/A;  WITH FULGURATION  . CYSTOSCOPY WITH BIOPSY N/A 12/09/2016   Procedure: CYSTOSCOPY WITH BIOPSY AND FULGURATION;  Surgeon: [name], MD;  [facility];  Service: Urology;  Laterality: N/A;  . CYSTOSCOPY WITH BIOPSY N/A 12/20/2017   Procedure: CYSTOSCOPY WITH BIOPSY WITH FULGURATION;  Surgeon: [name], MD;  [facility];  Service: Urology;  Laterality: N/A;  .  CYSTOSCOPY WITH BIOPSY N/A 06/09/2018   Procedure: CYSTOSCOPY WITH BIOPSY AND FULGURATION;  Surgeon: [name], MD;  [facility];  Service: Urology;  Laterality: N/A;  . CYSTOSCOPY WITH FULGERATION N/A 01/20/2016   Procedure: CYSTOSCOPY, BIOPSY WITH FULGERATION OF URTHRAL TUMOR;  Surgeon: [name], MD;  [facility];  Service: Urology;  Laterality: N/A;  . CYSTOSCOPY WITH FULGERATION N/A 06/01/2016   Procedure: CYSTOSCOPY WITH FULGERATION urethral tumors and bladder neck;  Surgeon: [name], MD;  [facility];  Service: Urology;  Laterality: N/A;  . SHOULDER SURGERY Left 1970's  . TONSILLECTOMY    . TRANSTHORACIC ECHOCARDIOGRAM  01-18-2017   dr hochrein   moderate LVH, ef 35-40%, diffuse hypokinesis/  trivial AR and MR/  mild TR  . TRANSURETHRAL RESECTION OF BLADDER TUMOR  10/07/2011   Procedure: TRANSURETHRAL RESECTION OF BLADDER TUMOR (TURBT);  Surgeon: Malka So, MD;  Location[facility];  Service: Urology;  Laterality: N/A;  . TRANSURETHRAL RESECTION OF BLADDER TUMOR N/A 07/10/2015   Procedure: TRANSURETHRAL  RESECTION OF BLADDER TUMOR (TURBT), CYSTOSCOPY ;  Surgeon: [name], MD;  [facility];  Service: Urology;  Laterality: N/A;  . TRANSURETHRAL RESECTION OF BLADDER TUMOR N/A 10/11/2019   Procedure: TRANSURETHRAL RESECTION OF PROSTATIC URETHRAL TUMOR;  Surgeon: Cleon Gustin, MD;  [facility];  Service: Urology;  Laterality: N/A;  . TRANSURETHRAL RESECTION OF BLADDER TUMOR WITH MITOMYCIN-C N/A 03/04/2020   Procedure: TRANSURETHRAL RESECTION OF BLADDER TUMOR WITH GEMCITABINE;  Surgeon: [name], MD;  [facility];  Service: Urology;  Laterality: N/A;  . TRANSURETHRAL RESECTION OF PROSTATE  03/14/2012   Procedure: TRANSURETHRAL RESECTION OF THE PROSTATE WITH GYRUS INSTRUMENTS;  Surgeon: Malka So, MD;  [facility];  Service: Urology;  Laterality: N/A;    Family History  Problem Relation Age of Onset  . Hypertension Mother   . Arthritis Mother   . Lung cancer Sister   . Deep vein thrombosis Father   . Early death Brother     Social History:  reports that he quit smoking about 45 years ago. His smoking use included cigarettes. He started smoking about 78 years ago. He has a 35.00 pack-year smoking history. He has never used smokeless tobacco. He reports previous alcohol use of about 7.0 standard drinks of alcohol per week. He reports that he does not use drugs.  Allergies:  Allergies  Allergen Reactions  . Atorvastatin Hives and Rash  . Rocephin [Ceftriaxone Sodium In Dextrose] Other (See Comments)    Recurrent seizures    Medications: Prior to Admission medications   Medication Sig Start Date End Date Taking? Authorizing Provider  acetaminophen (TYLENOL) 500 MG tablet Take 1,000 mg by mouth every 6 (six) hours as needed for moderate pain or headache.   Yes [provider]  albuterol (PROVENTIL) (2.5 MG/3ML) 0.083% nebulizer solution Take 2.5 mg by nebulization every 6 (six) hours as needed for wheezing or shortness of breath.   Yes [provider]  albuterol (VENTOLIN HFA) 108 (90 Base) MCG/ACT inhaler Inhale 2 puffs into the lungs every 6 (six) hours as needed for wheezing or shortness of breath.   Yes [provider]  apixaban (ELIQUIS) 2.5 MG TABS tablet Take 1 tablet (2.5 mg total) by mouth 2 (two) times daily. 07/21/20  Yes [place], Fransisca Kaufmann, MD  carvedilol (COREG) 6.25 MG tablet Take 1 tablet (6.25 mg total) by mouth 2 (two) times daily with a meal. 07/21/20  Yes [place]er,  Fransisca Kaufmann, MD  donepezil (ARICEPT) 10 MG tablet Take 1 tablet (10 mg total) by mouth at bedtime. 07/21/20  Yes [place]er, Fransisca Kaufmann, MD  febuxostat (ULORIC) 40 MG tablet Take 1 tablet (40 mg total) by mouth daily. 07/21/20  Yes [place]er, Fransisca Kaufmann, MD  fluticasone (FLONASE) 50 MCG/ACT nasal spray Place 1 spray into both nostrils 2 (two) times daily as needed for allergies or rhinitis. SPRAY 1 SPRAY IN EACH NOSTRIL ONCE DAILY. 04/17/20  Yes [place]er, Fransisca Kaufmann, MD  furosemide (LASIX) 20 MG tablet Take 1 tablet (20 mg total) by mouth daily as needed (signs of volume overload). 06/03/20  Yes Minus Breeding, MD  gabapentin (NEURONTIN) 400 MG capsule Take 1 capsule (400 mg total) by mouth 2 (two) times daily. 07/21/20  Yes [place]er, Fransisca Kaufmann, MD  Ipratropium-Albuterol (COMBIVENT RESPIMAT) 20-100 MCG/ACT AERS respimat Inhale 1 puff into the lungs every 6 (six) hours as needed for wheezing or shortness of breath (As needed for wheezing , cough or shortness of breath). Patient taking differently: Inhale 2 puffs into the lungs daily as needed for shortness of breath or wheezing. 07/09/19  Yes Emokpae, Courage, MD  MYRBETRIQ 25 MG TB24 tablet TAKE 1 TABLET DAILY AS DIRECTED Patient taking differently: Take 25 mg by mouth daily. 07/29/20  Yes McKenzie, Candee Furbish, MD  pantoprazole (PROTONIX) 40 MG tablet Take 1 tablet (40 mg total) by mouth daily. 07/21/20  Yes [place]er, Fransisca Kaufmann, MD  sertraline (ZOLOFT) 50 MG tablet Take 1 tablet (50 mg total) by mouth daily.  07/21/20  Yes [place]er, Fransisca Kaufmann, MD  simvastatin (ZOCOR) 10 MG tablet Take 1 tablet (10 mg total) by mouth daily at 6 PM. 07/21/20  Yes [place]er, Fransisca Kaufmann, MD    Scheduled Meds: . apixaban  2.5 mg Oral BID  . aspirin EC  81 mg Oral Daily  . carvedilol  6.25 mg Oral BID WC  . donepezil  10 mg Oral QHS  . febuxostat  40 mg Oral Daily  . folic acid  1 mg Oral Daily  . gabapentin  400 mg Oral BID  . mirabegron ER  25 mg Oral Daily  . pantoprazole  40 mg Oral Daily  . sertraline  50 mg Oral Daily  . simvastatin  10 mg Oral q1800  . thiamine injection  100 mg Intravenous Daily   Continuous Infusions: . levETIRAcetam 500 mg (08/18/20 1049)   PRN Meds:.acetaminophen, albuterol, fluticasone, Ipratropium-Albuterol, labetalol, LORazepam     Results for orders placed or performed during the hospital encounter of 08/16/20 (from the past 48 hour(s))  Brain natriuretic peptide     Status: Abnormal   Collection Time: 08/16/20  6:45 PM  Result Value Ref Range   B Natriuretic Peptide 1,856.0 (H) 0.0 - 100.0 pg/mL    Comment: Performed at [facility], [address], [place], [place] [address]  Comprehensive metabolic panel     Status: Abnormal   Collection Time: 08/16/20  6:47 PM  Result Value Ref Range   Sodium 141 135 - 145 mmol/L   Potassium 4.1 3.5 - 5.1 mmol/L   Chloride 105 98 - 111 mmol/L   CO2 29 22 - 32 mmol/L   Glucose, Bld 137 (H) 70 - 99 mg/dL    Comment: Glucose reference range applies only to samples taken after fasting for at least 8 hours.   BUN 38 (H) 8 - 23 mg/dL   Creatinine, Ser 1.66 (H) 0.61 - 1.24 mg/dL   Calcium 9.4 8.9 - 10.3  mg/dL   Total Protein 7.3 6.5 - 8.1 g/dL   Albumin 3.8 3.5 - 5.0 g/dL   AST 21 15 - 41 U/L   ALT 13 0 - 44 U/L   Alkaline Phosphatase 138 (H) 38 - 126 U/L   Total Bilirubin 0.3 0.3 - 1.2 mg/dL   GFR, Estimated 37 (L) >60 mL/min    Comment: (NOTE) Calculated using the CKD-EPI Creatinine Equation (2021)    Anion gap 7 5 - 15     Comment: Performed at [facility], [address] [place], [place] [address]  CBC WITH DIFFERENTIAL     Status: Abnormal   Collection Time: 08/16/20  6:47 PM  Result Value Ref Range   WBC 4.0 4.0 - 10.5 K/uL   RBC 4.66 4.22 - 5.81 MIL/uL   Hemoglobin 12.4 (L) 13.0 - 17.0 g/dL   HCT 41.1 39.0 - 52.0 %   MCV 88.2 80.0 - 100.0 fL   MCH 26.6 26.0 - 34.0 pg   MCHC 30.2 30.0 - 36.0 g/dL   RDW 17.4 (H) 11.5 - 15.5 %   Platelets 165 150 - 400 K/uL   nRBC 0.0 0.0 - 0.2 %   Neutrophils Relative % 65 %   Neutro Abs 2.6 1.7 - 7.7 K/uL   Lymphocytes Relative 26 %   Lymphs Abs 1.0 0.7 - 4.0 K/uL   Monocytes Relative 5 %   Monocytes Absolute 0.2 0.1 - 1.0 K/uL   Eosinophils Relative 3 %   Eosinophils Absolute 0.1 0.0 - 0.5 K/uL   Basophils Relative 1 %   Basophils Absolute 0.0 0.0 - 0.1 K/uL   Immature Granulocytes 0 %   Abs Immature Granulocytes 0.01 0.00 - 0.07 K/uL    Comment: Performed at [facility], [address], [place], [place] [address]  Protime-INR     Status: Abnormal   Collection Time: 08/16/20  6:47 PM  Result Value Ref Range   Prothrombin Time 15.8 (H) 11.4 - 15.2 seconds   INR 1.3 (H) 0.8 - 1.2    Comment: (NOTE) INR goal varies based on device and disease states. Performed at [facility], [address], [place], [place] [address]   APTT     Status: None   Collection Time: 08/16/20  6:47 PM  Result Value Ref Range   aPTT 30 24 - 36 seconds    Comment: Performed at [facility], [address] [place], [place] [address]  Blood gas, arterial     Status: Abnormal   Collection Time: 08/16/20  6:50 PM  Result Value Ref Range   FIO2 28.00    pH, Arterial 7.353 7.350 - 7.450   pCO2 arterial 50.7 (H) 32.0 - 48.0 mmHg   pO2, Arterial 73.7 (L) 83.0 - 108.0 mmHg   Bicarbonate 25.8 20.0 - 28.0 mmol/L   Acid-Base Excess 2.4 (H) 0.0 - 2.0 mmol/L   O2 Saturation 93.1 %   Patient temperature 37.0    Allens test (pass/fail) PASS PASS    Comment: Performed at [facility], [address] [place],  [address]  CBG monitoring, ED     Status: Abnormal   Collection Time: 08/16/20  6:54 PM  Result Value Ref Range   Glucose-Capillary 120 (H) 70 - 99 mg/dL    Comment: Glucose reference range applies only to samples taken after fasting for at least 8 hours.  Lactic acid, plasma     Status: Abnormal   Collection Time: 08/16/20  7:01 PM  Result Value  Ref Range   Lactic Acid, Venous 2.4 (HH) 0.5 - 1.9 mmol/L    Comment: CRITICAL RESULT CALLED TO, READ BACK BY AND VERIFIED WITH: PRUITT,G 1939 08/16/2020 COLEMAN,R Performed at [facility], [address], [place], [place] [address]   Blood Culture (routine x 2)     Status: None (Preliminary result)   Collection Time: 08/16/20  7:14 PM   Specimen: BLOOD RIGHT FOREARM  Result Value Ref Range   Specimen Description BLOOD RIGHT FOREARM    Special Requests      Blood Culture results may not be optimal due to an inadequate volume of blood received in culture bottles BOTTLES DRAWN AEROBIC AND ANAEROBIC   Culture      NO GROWTH 2 DAYS Performed at [facility], [address], [place], [place] [address]    Report Status PENDING   Blood Culture (routine x 2)     Status: None (Preliminary result)   Collection Time: 08/16/20  7:14 PM   Specimen: BLOOD RIGHT HAND  Result Value Ref Range   Specimen Description BLOOD RIGHT HAND    Special Requests      Blood Culture results may not be optimal due to an inadequate volume of blood received in culture bottles BOTTLES DRAWN AEROBIC AND ANAEROBIC   Culture      NO GROWTH 2 DAYS Performed at [facility], [address] [place], [place] [address]    Report Status PENDING   Resp Panel by RT-PCR (Flu A&B, Covid) Nasopharyngeal Swab     Status: None   Collection Time: 08/16/20  8:20 PM   Specimen: Nasopharyngeal Swab; Nasopharyngeal(NP) swabs in vial transport medium  Result Value Ref Range   SARS Coronavirus 2 by RT PCR NEGATIVE NEGATIVE    Comment: (NOTE) SARS-CoV-2  target nucleic acids are NOT DETECTED.  The SARS-CoV-2 RNA is generally detectable in upper respiratory specimens during the acute phase of infection. The lowest concentration of SARS-CoV-2 viral copies this assay can detect is 138 copies/mL. A negative result does not preclude SARS-Cov-2 infection and should not be used as the sole basis for treatment or other patient management decisions. A negative result may occur with  improper specimen collection/handling, submission of specimen other than nasopharyngeal swab, presence of viral mutation(s) within the areas targeted by this assay, and inadequate number of viral copies(<138 copies/mL). A negative result must be combined with clinical observations, patient history, and epidemiological information. The expected result is Negative.  Fact Sheet for Patients:  EntrepreneurPulse.com.au  Fact Sheet for Healthcare Providers:  IncredibleEmployment.be  This test is no t yet approved or cleared by the [place] FDA and  has been authorized for detection and/or diagnosis of SARS-CoV-2 by FDA under an Emergency Use Authorization (EUA). This EUA will remain  in effect (meaning this test can be used) for the duration of the COVID-19 declaration under Section 564(b)(1) of the Act, 21 U.S.C.section 360bbb-3(b)(1), unless the authorization is terminated  or revoked sooner.       Influenza A by PCR NEGATIVE NEGATIVE   Influenza B by PCR NEGATIVE NEGATIVE    Comment: (NOTE) The Xpert Xpress SARS-CoV-2/FLU/RSV plus assay is intended as an aid in the diagnosis of influenza from Nasopharyngeal swab specimens and should not be used as a sole basis for treatment. Nasal washings and aspirates are unacceptable for Xpert Xpress SARS-CoV-2/FLU/RSV testing.  Fact Sheet for Patients: EntrepreneurPulse.com.au  Fact Sheet for Healthcare Providers: IncredibleEmployment.be  This  test is not yet approved or cleared by the  [place] Architectural technologist and has been authorized for detection and/or diagnosis of SARS-CoV-2 by FDA under an Print production planner (EUA). This EUA will remain in effect (meaning this test can be used) for the duration of the COVID-19 declaration under Section 564(b)(1) of the Act, 21 U.S.C. section 360bbb-3(b)(1), unless the authorization is terminated or revoked.  Performed at [facility], [address] [place], [place] [address]   Lactic acid, plasma     Status: None   Collection Time: 08/16/20  8:38 PM  Result Value Ref Range   Lactic Acid, Venous 1.7 0.5 - 1.9 mmol/L    Comment: Performed at [facility], [address], [place], [place] [address]  Procalcitonin - Baseline     Status: None   Collection Time: 08/16/20  8:38 PM  Result Value Ref Range   Procalcitonin <0.10 ng/mL    Comment:        Interpretation: PCT (Procalcitonin) <= 0.5 ng/mL: Systemic infection (sepsis) is not likely. Local bacterial infection is possible. (NOTE)       Sepsis PCT Algorithm           Lower Respiratory Tract                                      Infection PCT Algorithm    ----------------------------     ----------------------------         PCT < 0.25 ng/mL                PCT < 0.10 ng/mL          Strongly encourage             Strongly discourage   discontinuation of antibiotics    initiation of antibiotics    ----------------------------     -----------------------------       PCT 0.25 - 0.50 ng/mL            PCT 0.10 - 0.25 ng/mL               OR       >80% decrease in PCT            Discourage initiation of                                            antibiotics      Encourage discontinuation           of antibiotics    ----------------------------     -----------------------------         PCT >= 0.50 ng/mL              PCT 0.26 - 0.50 ng/mL               AND        <80% decrease in PCT             Encourage initiation of                                              antibiotics       Encourage continuation           of antibiotics    ----------------------------     -----------------------------  PCT >= 0.50 ng/mL                  PCT > 0.50 ng/mL               AND         increase in PCT                  Strongly encourage                                      initiation of antibiotics    Strongly encourage escalation           of antibiotics                                     -----------------------------                                           PCT <= 0.25 ng/mL                                                 OR                                        > 80% decrease in PCT                                      Discontinue / Do not initiate                                             antibiotics  Performed at [facility], [address], [place], [place] [address]   Urinalysis, Routine w reflex microscopic Urine, Clean Catch     Status: Abnormal   Collection Time: 08/16/20 10:30 PM  Result Value Ref Range   Color, Urine YELLOW YELLOW   APPearance HAZY (A) CLEAR   Specific Gravity, Urine 1.009 1.005 - 1.030   pH 5.0 5.0 - 8.0   Glucose, UA NEGATIVE NEGATIVE mg/dL   Hgb urine dipstick SMALL (A) NEGATIVE   Bilirubin Urine NEGATIVE NEGATIVE   Ketones, ur NEGATIVE NEGATIVE mg/dL   Protein, ur 30 (A) NEGATIVE mg/dL   Nitrite NEGATIVE NEGATIVE   Leukocytes,Ua NEGATIVE NEGATIVE   RBC / HPF 11-20 0 - 5 RBC/hpf   WBC, UA 0-5 0 - 5 WBC/hpf   Bacteria, UA NONE SEEN NONE SEEN   Squamous Epithelial / LPF 6-10 0 - 5   Hyaline Casts, UA PRESENT     Comment: Performed at [facility], [address] [place],  [address]  Urine culture     Status: None   Collection Time: 08/16/20 10:30 PM   Specimen: Urine, Clean Catch  Result Value Ref Range   Specimen Description  URINE, CLEAN CATCH Performed at [facility], [address] [place], [place] [address]    Special Requests      NONE Performed at [facility], [address] [place], [place] [address]    Culture      NO GROWTH Performed at [facility], [place] [address], [place], [place] [address]    Report Status 08/18/2020 FINAL   Basic metabolic panel     Status: Abnormal   Collection Time: 08/17/20  3:47 AM  Result Value Ref Range   Sodium 140 135 - 145 mmol/L   Potassium 4.2 3.5 - 5.1 mmol/L   Chloride 102 98 - 111 mmol/L   CO2 31 22 - 32 mmol/L   Glucose, Bld 86 70 - 99 mg/dL    Comment: Glucose reference range applies only to samples taken after fasting for at least 8 hours.   BUN 33 (H) 8 - 23 mg/dL   Creatinine, Ser 1.24 0.61 - 1.24 mg/dL   Calcium 9.0 8.9 - 10.3 mg/dL   GFR, Estimated 53 (L) >60 mL/min    Comment: (NOTE) Calculated using the CKD-EPI Creatinine Equation (2021)    Anion gap 7 5 - 15    Comment: Performed at [facility], [address] [place], [place] [address]  CBC     Status: Abnormal   Collection Time: 08/17/20  3:47 AM  Result Value Ref Range   WBC 4.4 4.0 - 10.5 K/uL   RBC 4.32 4.22 - 5.81 MIL/uL   Hemoglobin 11.5 (L) 13.0 - 17.0 g/dL   HCT 37.6 (L) 39.0 - 52.0 %   MCV 87.0 80.0 - 100.0 fL   MCH 26.6 26.0 - 34.0 pg   MCHC 30.6 30.0 - 36.0 g/dL   RDW 17.2 (H) 11.5 - 15.5 %   Platelets 146 (L) 150 - 400 K/uL   nRBC 0.0 0.0 - 0.2 %    Comment: Performed at [facility], [address], [place], [place] [id number]  Basic metabolic panel     Status: Abnormal   Collection Time: 08/18/20  8:26 AM  Result Value Ref Range   Sodium 141 135 - 145 mmol/L   Potassium 4.5 3.5 - 5.1 mmol/L   Chloride 107 98 - 111 mmol/L   CO2 29 22 - 32 mmol/L   Glucose, Bld 86 70 - 99 mg/dL    Comment: Glucose reference range applies only to samples taken after fasting for at least 8 hours.   BUN 28 (H) 8 - 23 mg/dL   Creatinine, Ser 1.26 (H) 0.61 - 1.24 mg/dL   Calcium 9.2 8.9 - 10.3 mg/dL   GFR, Estimated 52 (L) >60 mL/min    Comment: (NOTE) Calculated using the CKD-EPI Creatinine Equation (2021)    Anion gap 5  5 - 15    Comment: Performed at [facility], [address] [place], [place] [address]  Magnesium     Status: None   Collection Time: 08/18/20  8:26 AM  Result Value Ref Range   Magnesium 1.9 1.7 - 2.4 mg/dL    Comment: Performed at [facility], [address], [place], [place] [address]  CBC     Status: Abnormal   Collection Time: 08/18/20  8:26 AM  Result Value Ref Range   WBC 3.2 (L) 4.0 - 10.5 K/uL   RBC 4.26 4.22 - 5.81 MIL/uL   Hemoglobin 11.4 (L) 13.0 - 17.0 g/dL   HCT 37.7 (L) 39.0 - 52.0 %   MCV  88.5 80.0 - 100.0 fL   MCH 26.8 26.0 - 34.0 pg   MCHC 30.2 30.0 - 36.0 g/dL   RDW 17.3 (H) 11.5 - 15.5 %   Platelets 132 (L) 150 - 400 K/uL   nRBC 0.0 0.0 - 0.2 %    Comment: Performed at [facility], [address] [place], [place] [address]  Protime-INR     Status: Abnormal   Collection Time: 08/18/20  8:26 AM  Result Value Ref Range   Prothrombin Time 17.6 (H) 11.4 - 15.2 seconds   INR 1.5 (H) 0.8 - 1.2    Comment: (NOTE) INR goal varies based on device and disease states. Performed at [facility], [address], [place], [place] [address]    *Note: Due to a large number of results and/or encounters for the requested time period, some results have not been displayed. A complete set of results can be found in Results Review.    Studies/Results: BRAIN MRI FINDINGS: Brain: No restricted diffusion or evidence of acute infarction. Chronic lacunar type infarct of the left corona radiata, basal ganglia.  Cavum septum pellucidum, normal variant. Nomidline shift, mass effect, evidence of mass lesion, ventriculomegaly, extra-axial collection or acute intracranial hemorrhage. Cervicomedullary junction and pituitary are within normal limits. Scattered small areas of subcortical gliosis or mild cortical encephalomalacia in the right superior frontal gyrus are stable from last year. With mild nonspecific white matter T2 and FLAIR hyperintensity elsewhere. No definite chronic  cerebral blood products. Negative brainstem and cerebellum.  Vascular: Major intracranial vascular flow voids are stable.  Skull and upper cervical spine: Negative for age.  Sinuses/Orbits: Stable, negative.  Other: Chronic susceptibility artifact due to dentition. At the left maxilla probably mild new left mastoid effusion since last year. Negative visible nasopharynx. Right mastoids remain clear.  IMPRESSION: No acute intracranial abnormality and stable MRI appearance of the brain since last year.  Chronic small vessel ischemia most pronounced in the left corona radiata.      MRI is reviewed in person. No acute changes are noted. There is moderate atrophy appropriate for age for the most part. There is mild to moderate periventricular and deep white matter leukoencephalopathy. There is encephalomalacia involving the left basal ganglia extending to the centrum semiovale/ corona radiata indicative of remote infarct. There is also evidence of a cortical based encephalomalacia and the associated white matter changes involving the right parietal area consistent with the small cortical infarct. No hemorrhages noted.    Christofer Shen A. Merlene Laughter, M.D.  Diplomate, Tax adviser of Psychiatry and Neurology ( Neurology). 08/18/2020, 6:23 PM

## 2020-08-18 NOTE — Evaluation (Signed)
Clinical/Bedside Swallow Evaluation Patient Details  Name: Scott [name] MRN: [id number] Date of Birth: 1922-02-20  Today's Date: 08/18/2020 Time: SLP Start Time (ACUTE ONLY): 1315 SLP Stop Time (ACUTE ONLY): 1339 SLP Time Calculation (min) (ACUTE ONLY): 24 min  Past Medical History:  Past Medical History:  Diagnosis Date  . Arthritis    "all over"  . Atrial fibrillation ([place])   . Benign localized prostatic hyperplasia with lower urinary tract symptoms (LUTS)   . Bradycardia 11/28/2015   Severe-HR in the 20s to 30s following intubation for urological procedure.  Marland Kitchen CAD- non obstructive disease by cath 3/14 cardiologist-  dr hochrein  . Chronic lower back pain   . COPD (chronic obstructive pulmonary disease) ([place])   . DDD (degenerative disc disease)   . Depression   . Dyspnea    w/ exertion and lying down (raise head)  . ETOH abuse   . GERD (gastroesophageal reflux disease)   . Glaucoma   . Glaucoma, both eyes   . History of bladder cancer urologist-- dr Jeffie Pollock   first dx 2012--  s/p TURBT's  . History of DVT of lower extremity 03/2013   bilateral   . History of pulmonary embolus (PE) 04/2013  . HTN (hypertension)   . Hyperlipidemia   . Hypertension   . LBBB (left bundle branch block)    chronic  . Non-ischemic cardiomyopathy ([place])    last echo 01-18-2017, ef 35-40%  . NSVT (nonsustained ventricular tachycardia) ([place])    a. NSVT 06/2012; NSVT also seen during 03/2013 adm. b. Med rx. Not candidate for ICD given adv age.  Marland Kitchen PAD (peripheral artery disease) ([place])   . 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 PVCs (premature ventricular contractions)   . PVD (peripheral vascular disease) (HCC)    Right ABI .75, Left .78 (2006)  . Syncope 06/2012   a. Felt to be postural syncope related to diuretics 06/2012.  Marland Kitchen Systolic and diastolic CHF, chronic (HCC) cardiologsit-  dr hochrein   a. NICM -  patent cors 06/2012, EF 40% at that time. b. 03/2013 eval: EF 20-25%.  . Vision loss, left eye    due to glaucoma  . Wears glasses    Past Surgical History:  Past Surgical History:  Procedure Laterality Date  . AMPUTATION Left 10/16/2014   Procedure: AMPUTATION ABOVE KNEE- LEFT;  Surgeon: Mal Misty, MD;  Location: [place];  Service: Vascular;  Laterality: Left;  . CARDIAC CATHETERIZATION  05-11-2004  DR [facility]   MINIMAL CORANARY PLAQUE/ NORMAL LVF/ EF 55%/  NON-OBSTRUCTIVE LAD 25%  . CARDIAC CATHETERIZATION  06/18/2012   dr hochrein   mild luminal irregularities of coronaries, ef 45-50%  . CARDIOVASCULAR STRESS TEST  10-15-2010   LOW RISK NUCLEAR STUDY/ NO EVIDENCE OF ISCHEMIA/ NORMAL EF  . CATARACT EXTRACTION W/ INTRAOCULAR LENS  IMPLANT, BILATERAL Bilateral   . CYSTOSCOPY  10/07/2011   Procedure: CYSTOSCOPY;  Surgeon: Malka So, MD;  [facility];  Service: Urology;  Laterality: N/A;  . CYSTOSCOPY N/A 10/20/2018   Procedure: CYSTOSCOPY WITH FULGURATION OF PROSTATIC URETHRAL TUMOR;  Surgeon: [name], MD;  Location: [facility];  Service: Urology;  Laterality: N/A;  . CYSTOSCOPY N/A 10/11/2019   Procedure: CYSTOSCOPY;  Surgeon: Cleon Gustin, MD;  Location: [facility];  Service: Urology;  Laterality: N/A;  . CYSTOSCOPY W/ RETROGRADES Bilateral 03/04/2020   Procedure: CYSTOSCOPY WITH BILATERAL  RETROGRADE;  Surgeon: [name], MD;  Location: [facility];  Service: Urology;  Laterality: Bilateral;  . CYSTOSCOPY WITH BIOPSY  03/14/2012   Procedure: CYSTOSCOPY WITH BIOPSY;  Surgeon: Malka So, MD;  Location: [facility];  Service: Urology;  Laterality: N/A;  WITH FULGURATION  . CYSTOSCOPY WITH BIOPSY N/A 12/09/2016   Procedure: CYSTOSCOPY WITH BIOPSY AND FULGURATION;  Surgeon: [name], MD;  [facility];  Service: Urology;  Laterality: N/A;  . CYSTOSCOPY WITH BIOPSY N/A 12/20/2017   Procedure: CYSTOSCOPY WITH BIOPSY WITH FULGURATION;  Surgeon: [name], MD;   [facility];  Service: Urology;  Laterality: N/A;  . CYSTOSCOPY WITH BIOPSY N/A 06/09/2018   Procedure: CYSTOSCOPY WITH BIOPSY AND FULGURATION;  Surgeon: [name], MD;  Location: [facility];  Service: Urology;  Laterality: N/A;  . CYSTOSCOPY WITH FULGERATION N/A 01/20/2016   Procedure: CYSTOSCOPY, BIOPSY WITH FULGERATION OF URTHRAL TUMOR;  Surgeon: [name], MD;  [facility];  Service: Urology;  Laterality: N/A;  . CYSTOSCOPY WITH FULGERATION N/A 06/01/2016   Procedure: CYSTOSCOPY WITH FULGERATION urethral tumors and bladder neck;  Surgeon: [name], MD;  [facility];  Service: Urology;  Laterality: N/A;  . SHOULDER SURGERY Left 1970's  . TONSILLECTOMY    . TRANSTHORACIC ECHOCARDIOGRAM  01-18-2017   dr hochrein   moderate LVH, ef 35-40%, diffuse hypokinesis/  trivial AR and MR/  mild TR  . TRANSURETHRAL RESECTION OF BLADDER TUMOR  10/07/2011   Procedure: TRANSURETHRAL RESECTION OF BLADDER TUMOR (TURBT);  Surgeon: Malka So, MD;  [facility];  Service: Urology;  Laterality: N/A;  . TRANSURETHRAL RESECTION OF BLADDER TUMOR N/A 07/10/2015   Procedure: TRANSURETHRAL RESECTION OF BLADDER TUMOR (TURBT), CYSTOSCOPY ;  Surgeon: [name], MD;  [facility];  Service: Urology;  Laterality: N/A;  . TRANSURETHRAL RESECTION OF BLADDER TUMOR N/A 10/11/2019   Procedure: TRANSURETHRAL RESECTION OF PROSTATIC URETHRAL TUMOR;  Surgeon: Cleon Gustin, MD;  Location: [facility];  Service: Urology;  Laterality: N/A;  . TRANSURETHRAL RESECTION OF BLADDER TUMOR WITH MITOMYCIN-C N/A 03/04/2020   Procedure: TRANSURETHRAL RESECTION OF BLADDER TUMOR WITH GEMCITABINE;  Surgeon: [name], MD;  [facility];  Service: Urology;  Laterality: N/A;  . TRANSURETHRAL RESECTION OF PROSTATE  03/14/2012   Procedure: TRANSURETHRAL RESECTION OF THE PROSTATE WITH GYRUS INSTRUMENTS;  Surgeon: Malka So, MD;  [facility];  Service: Urology;  Laterality: N/A;   HPI:  Scott [name] is a 87 y.o. male with a history of paroxysmal atrial fibrillation, coronary artery disease, nonischemic cardiomyopathy, COPD, peripheral artery disease, status post left AKA, hypertension, history of PE and DVT currently on Eliquis, systolic and diastolic heart failure with last echo in 9/21 with an EF of 30%, Alzheimer's.  As patient has Alzheimer's, he is unable to provide history and the history is provided by son and caregiver.  Patient presents with 4 witnessed seizure-like activities.  His seizure activity appears to be new onset and he has been started on IV Keppra.  Further plans per neurology evaluation as well as MRI brain pending for 5/9. BSE requested due to Pt with notable difficulty swallowing yesterday and was changed to a puree diet.   Assessment / Plan / Recommendation Clinical Impression  Clinical swallow evaluation completed at bedside. Pt alert and cooperative. He tells SLP that he lives at home with caregivers, son nearby, he's 59 and has a birthday coming up. He denies difficulty swallowing, but states that he has  acid reflux and occasionally coughs after he eats. Pt assessed with ice chips, water via cup/straw, puree, and regular textures. He does present with an occasional delayed cough and throat clear after po trials, but vocal quality remains clear and voice is strong. Pt with min prolonged oral transit with solids, no appreciable oral residuals. Ok to advance to D3/mech soft and thin liquids with po medications whole with liquids with standard aspiration and reflux precautions. Pt will follow x1 during acute stay to ensure diet tolerance and caregiver education as needed. SLP Visit Diagnosis: Dysphagia, unspecified (R13.10)    Aspiration Risk  Mild aspiration risk    Diet Recommendation Dysphagia 3 (Mech soft);Thin liquid   Liquid Administration via: Cup;Straw Medication Administration: Whole meds with liquid Supervision: Staff to assist with self  feeding Compensations: Slow rate;Small sips/bites Postural Changes: Seated upright at 90 degrees;Remain upright for at least 30 minutes after po intake    Other  Recommendations Oral Care Recommendations: Oral care BID;Staff/trained caregiver to provide oral care Other Recommendations: Clarify dietary restrictions   Follow up Recommendations 24 hour supervision/assistance      Frequency and Duration min 2x/week  1 week       Prognosis Prognosis for Safe Diet Advancement: Good      Swallow Study   General Date of Onset: 08/16/20 HPI: Scott [name] is a 87 y.o. male with a history of paroxysmal atrial fibrillation, coronary artery disease, nonischemic cardiomyopathy, COPD, peripheral artery disease, status post left AKA, hypertension, history of PE and DVT currently on Eliquis, systolic and diastolic heart failure with last echo in 9/21 with an EF of 30%, Alzheimer's.  As patient has Alzheimer's, he is unable to provide history and the history is provided by son and caregiver.  Patient presents with 4 witnessed seizure-like activities.  His seizure activity appears to be new onset and he has been started on IV Keppra.  Further plans per neurology evaluation as well as MRI brain pending for 5/9. BSE requested due to Pt with notable difficulty swallowing yesterday and was changed to a puree diet. Type of Study: Bedside Swallow Evaluation Previous Swallow Assessment: BSE 02/2020 D2/thin Diet Prior to this Study: Dysphagia 1 (puree);Thin liquids Temperature Spikes Noted: No Respiratory Status: Nasal cannula History of Recent Intubation: No Behavior/Cognition: Alert;Cooperative;Pleasant mood Oral Cavity Assessment: Within Functional Limits Oral Care Completed by SLP: Yes Oral Cavity - Dentition: Missing dentition;Adequate natural dentition Vision: Functional for self-feeding Self-Feeding Abilities: Needs set up;Needs assist Patient Positioning: Upright in bed Baseline Vocal Quality:  Normal Volitional Cough: Strong Volitional Swallow: Able to elicit    Oral/Motor/Sensory Function Overall Oral Motor/Sensory Function: Within functional limits   Ice Chips Ice chips: Within functional limits Presentation: Spoon   Thin Liquid Thin Liquid: Impaired Presentation: Cup;Spoon;Straw Pharyngeal  Phase Impairments: Cough - Delayed    Nectar Thick Nectar Thick Liquid: Not tested   Honey Thick Honey Thick Liquid: Not tested   Puree Puree: Within functional limits Presentation: Spoon   Solid     Solid: Within functional limits     Thank you,  Genene Churn, [place]  Trisa Cranor 08/18/2020,1:45 PM

## 2020-08-18 NOTE — Progress Notes (Signed)
Telemetry called, said patient had nonsustained VT/4 beats. MD Josephine Cables made aware.

## 2020-08-18 NOTE — TOC Initial Note (Signed)
Transition of Care [facility]) - Initial/Assessment Note    Patient Details  Name: Scott [name] MRN: [id number] Date of Birth: May 26, 1921  [facility] CM/SW Contact:    Shade Flood, LCSW Phone Number: 08/18/2020, 12:42 PM  Clinical Narrative:                  Pt admitted from home. Received consult for HF [facility] screen. Met with pt and one of his caregivers at bedside. Pt has some disorientation. Caregiver states pt has 24/7 caregivers. He has a wheelchair for use at home. Pt follows heart healthy diet but does not weigh daily. Caregiver states either they or pt's son take pt to appointments. Medications are delivered to the home.   Pt and family agreeable to [facility] and outpatient palliative at dc. Discussed provider options and referred as requested. [place] [facility] will follow up at dc. [facility] will follow with RN and PT.  Will follow up in AM.  Expected Discharge Plan: [place] Barriers to Discharge: Continued Medical Work up   Patient Goals and CMS Choice Patient states their goals for this hospitalization and ongoing recovery are:: go home      Expected Discharge Plan and Services Expected Discharge Plan: [place] In-house Referral: Clinical Social Work   Post Acute Care Choice: [place] arrangements for the past 2 months: [place]: PT,RN [place]: [place] ([place]) Date [place]: 08/18/20   Representative spoke with at [place]: Vaughan Basta  Prior Living Arrangements/Services Living arrangements for the past 2 months: [place] with:: Self,Other (Comment) (24/7 caregivers) Patient language and need for interpreter reviewed:: Yes Do you feel safe going back to the place where you live?: Yes      Need for Family Participation in Patient Care: Yes (Comment) Care giver support system in place?: Yes (comment) Current  home services: [place] Criminal Activity/Legal Involvement Pertinent to Current Situation/Hospitalization: No - Comment as needed  Activities of Daily Living Home Assistive Devices/Equipment: Butler chair with back ADL Screening (condition at time of admission) Patient's cognitive ability adequate to safely complete daily activities?: Yes Is the patient deaf or have difficulty hearing?: No Does the patient have difficulty seeing, even when wearing glasses/contacts?: No Does the patient have difficulty concentrating, remembering, or making decisions?: No Patient able to express need for assistance with ADLs?: Yes Does the patient have difficulty dressing or bathing?: Yes Independently performs ADLs?: No Communication: Independent Dressing (OT): Needs assistance Is this a change from baseline?: Pre-admission baseline Grooming: Needs assistance Is this a change from baseline?: Pre-admission baseline Feeding: Independent Bathing: Needs assistance Is this a change from baseline?: Pre-admission baseline Toileting: Needs assistance Is this a change from baseline?: Pre-admission baseline In/Out Bed: Needs assistance Is this a change from baseline?: Pre-admission baseline Walks in Home: Dependent Is this a change from baseline?: Pre-admission baseline Does the patient have difficulty walking or climbing stairs?: Yes Weakness of Legs: None Weakness of Arms/Hands: None  Permission Sought/Granted Permission sought to share information with : Facility Art therapist granted to share information with : Yes, Verbal Permission Granted     Permission granted to share info w AGENCY: Home Health        Emotional Assessment Appearance:: Appears younger than stated age  Attitude/Demeanor/Rapport: Engaged Affect (typically observed): Pleasant Orientation: : Oriented to Self,Oriented to Place Alcohol / Substance Use: Not Applicable Psych Involvement: No  (comment)  Admission diagnosis:  Seizure ([place]) [R56.9] AKI (acute kidney injury) ([place]) [N17.9] Patient Active Problem List   Diagnosis Date Noted  . Seizure ([place]) 08/16/2020  . Acute respiratory acidosis 03/05/2020  . Bladder cancer ([place]) 03/04/2020  . 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 Acute on chronic systolic CHF (congestive heart failure) ([place]) 12/11/2018  . Acute respiratory failure with hypoxia ([place])   . Alzheimer disease ([place]) 09/07/2018  . Paroxysmal atrial fibrillation ([place]) 01/18/2017  . PVC (pulmonary venous congestion) 01/18/2017  . Thoracic aortic atherosclerosis ([place]) 11/26/2016  . Thrombocytopenia ([place]) 08/04/2016  . Essential hypertension 11/29/2015  . Bradycardia 11/28/2015  . Malignant neoplasm of lateral wall of urinary bladder ([place]) 07/10/2015  . Status post above-knee amputation of left lower extremity ([place]) 10/18/2014  . Atherosclerotic PVD with ulceration ([place]) 10/03/2014  . PAD (peripheral artery disease) ([place]) 09/24/2014  . History of non-ST elevation myocardial infarction (NSTEMI) 10/09/2013  . High risk medication use 05/17/2013  . Vitamin D deficiency 05/17/2013  . History of pulmonary embolism (2014) 04/17/2013  . NSVT (nonsustained ventricular tachycardia)- not an ICD candidate 04/17/2013  . NICM (nonischemic cardiomyopathy)- EF 20-25% by Echo 04/12/13 04/17/2013  . Chronic combined systolic and diastolic heart failure ([place]) 03/15/2013  . DVT of lower extremity, bilateral ([place]) 03/15/2013  . Benign localized hyperplasia of prostate with urinary obstruction 03/15/2012  . Hyperlipidemia   . PVCs (premature ventricular contractions)   . CAD- non obstructive disease by cath 3/14    PCP:  Det[place]er, Fransisca Kaufmann, MD Pharmacy:   [facility], [place] [place] [address] Phone: [phone] Fax: [phone]     Social Determinants of Health (SDOH) Interventions    Readmission  Risk Interventions Readmission Risk Prevention Plan 08/18/2020 12/23/2019 07/09/2019  Transportation Screening Complete Complete Complete  PCP or Specialist Appt within 3-5 Days - - -  [place] or Home Care Consult Complete - -  [place] or Home Care Consult comments - - -  Social Work Consult for [place] Planning/Counseling Complete - -  Palliative Care Screening Not Applicable - -  Medication Review Press photographer) Complete Complete Complete  PCP or Specialist appointment within 3-5 days of discharge - Complete Complete  HRI or [place] - Complete Complete  HRI or Home Care Consult Pt Refusal Comments - - -  SW Recovery Care/Counseling Consult - Complete Complete  Palliative Care Screening - Not Applicable Complete  Skilled Nursing Facility - Not Applicable Complete  SNF Comments - - -  Some recent data might be hidden

## 2020-08-18 NOTE — Progress Notes (Signed)
Patient refused morning labs. MD A[place]eso notified.

## 2020-08-19 ENCOUNTER — Inpatient Hospital Stay (HOSPITAL_COMMUNITY)
Admit: 2020-08-19 | Discharge: 2020-08-19 | Disposition: A | Discharge status: Home / Self Care | Payer: Medicare Other | Attending: Internal Medicine | Admitting: Internal Medicine

## 2020-08-19 ENCOUNTER — Telehealth: Payer: Self-pay

## 2020-08-19 DIAGNOSIS — F0280 Dementia in other diseases classified elsewhere without behavioral disturbance: Secondary | ICD-10-CM

## 2020-08-19 DIAGNOSIS — R569 Unspecified convulsions: Secondary | ICD-10-CM | POA: Diagnosis not present

## 2020-08-19 DIAGNOSIS — I5042 Chronic combined systolic (congestive) and diastolic (congestive) heart failure: Secondary | ICD-10-CM

## 2020-08-19 DIAGNOSIS — I480 Paroxysmal atrial fibrillation: Secondary | ICD-10-CM

## 2020-08-19 LAB — BASIC METABOLIC PANEL
Anion gap: 6 (ref 5–15)
BUN: 31 mg/dL — ABNORMAL HIGH (ref 8–23)
CO2: 29 mmol/L (ref 22–32)
Calcium: 9.4 mg/dL (ref 8.9–10.3)
Chloride: 108 mmol/L (ref 98–111)
Creatinine, Ser: 1.27 mg/dL — ABNORMAL HIGH (ref 0.61–1.24)
GFR, Estimated: 52 mL/min — ABNORMAL LOW (ref >60)
Glucose, Bld: 101 mg/dL — ABNORMAL HIGH (ref 70–99)
Potassium: 4.4 mmol/L (ref 3.5–5.1)
Sodium: 143 mmol/L (ref 135–145)

## 2020-08-19 LAB — MAGNESIUM: Magnesium: 1.8 mg/dL (ref 1.7–2.4)

## 2020-08-19 MED ORDER — LEVETIRACETAM 250 MG PO TABS: 250.0000 mg | ORAL_TABLET | Freq: Two times a day (BID) | ORAL | 3 refills | Status: DC

## 2020-08-19 MED ORDER — ASPIRIN 81 MG PO TBEC: 81.0000 mg | DELAYED_RELEASE_TABLET | Freq: Every day | ORAL | 11 refills | Status: AC

## 2020-08-19 MED ORDER — GUAIFENESIN ER 600 MG PO TB12: 600.0000 mg | ORAL_TABLET | Freq: Two times a day (BID) | ORAL | 0 refills | Status: AC

## 2020-08-19 NOTE — Telephone Encounter (Signed)
Scott [name] from [facility] called Pt needs referral for palliative care

## 2020-08-19 NOTE — Procedures (Signed)
  [place] A. Merlene Laughter, MD     [email or web address]           HISTORY: This is a 87 year old who presents with multiple seizures.  MEDICATIONS:  Current Facility-Administered Medications:  .  acetaminophen (TYLENOL) tablet 650 mg, 650 mg, Oral, Q6H PRN, Manuella Ghazi, Pratik D, DO, 650 mg at 08/17/20 1327 .  albuterol (PROVENTIL) (2.5 MG/3ML) 0.083% nebulizer solution 2.5 mg, 2.5 mg, Nebulization, Q6H PRN, Truett Mainland, DO .  apixaban (ELIQUIS) tablet 2.5 mg, 2.5 mg, Oral, BID, Manuella Ghazi, Pratik D, DO, 2.5 mg at 08/19/20 0902 .  aspirin EC tablet 81 mg, 81 mg, Oral, Daily, Truett Mainland, DO, 81 mg at 08/19/20 5277 .  carvedilol (COREG) tablet 6.25 mg, 6.25 mg, Oral, BID WC, Truett Mainland, DO, 6.25 mg at 08/19/20 0741 .  donepezil (ARICEPT) tablet 10 mg, 10 mg, Oral, QHS, Truett Mainland, DO, 10 mg at 08/18/20 2204 .  febuxostat (ULORIC) tablet 40 mg, 40 mg, Oral, Daily, Truett Mainland, DO, 40 mg at 08/19/20 8242 .  fluticasone (FLONASE) 50 MCG/ACT nasal spray 1 spray, 1 spray, Each Nare, BID PRN, Truett Mainland, DO .  folic acid (FOLVITE) tablet 1 mg, 1 mg, Oral, Daily, Truett Mainland, DO, 1 mg at 08/19/20 0903 .  gabapentin (NEURONTIN) capsule 400 mg, 400 mg, Oral, BID, Truett Mainland, DO, 400 mg at 08/19/20 3536 .  Ipratropium-Albuterol (COMBIVENT) respimat 2 puff, 2 puff, Inhalation, Daily PRN, Truett Mainland, DO .  labetalol (NORMODYNE) injection 10 mg, 10 mg, Intravenous, Q2H PRN, Manuella Ghazi, Pratik D, DO .  levETIRAcetam (KEPPRA) IVPB 500 mg/100 mL premix, 500 mg, Intravenous, Q12H, Truett Mainland, DO, Last Rate: 400 mL/hr at 08/19/20 0742, 500 mg at 08/19/20 0742 .  LORazepam (ATIVAN) injection 2 mg, 2 mg, Intravenous, Q4H PRN, Manuella Ghazi, Pratik D, DO .  mirabegron ER (MYRBETRIQ) tablet 25 mg, 25 mg, Oral, Daily, Truett Mainland, DO, 25 mg at 08/19/20 1443 .  pantoprazole (PROTONIX) EC tablet 40 mg, 40 mg, Oral, Daily, Manuella Ghazi, Pratik D, DO, 40 mg at 08/19/20 0901 .   sertraline (ZOLOFT) tablet 50 mg, 50 mg, Oral, Daily, Truett Mainland, DO, 50 mg at 08/19/20 1540 .  simvastatin (ZOCOR) tablet 10 mg, 10 mg, Oral, q1800, Truett Mainland, DO, 10 mg at 08/18/20 2203 .  thiamine (B-1) injection 100 mg, 100 mg, Intravenous, Daily, Manuella Ghazi, Pratik D, DO, 100 mg at 08/19/20 0902     ANALYSIS: A 16 channel recording using standard 10 20 measurements is conducted for 23 minutes.  The background activity shows a dominant rhythm of 8-8 and half hertz. There is beta activity observed in the frontal areas. There is normal myogenic interference. Awake and drowsy architecture is noted. Photic stimulation and hyperventilation are not carried out. There is no focal or lateral slowing. No epileptiform activities are noted.   IMPRESSION:  1. This is a normal recording of awake and drowsy states.      Lafaye Mcelmurry A. Merlene Laughter, M.D.  Diplomate, Tax adviser of Psychiatry and Neurology ( Neurology).

## 2020-08-19 NOTE — TOC Transition Note (Signed)
Transition of Care [facility]) - CM/SW Discharge Note   Patient Details  Name: Scott [name] MRN: [id number] Date of Birth: 1921/08/11  [facility] CM/SW Contact:  Salome Arnt, [place] Phone Number: 08/19/2020, 11:43 AM   Clinical Narrative:  Pt d/c today and will return home with home health PT/RN through [facility] and outpatient palliative with [facility]. Both agencies notified of d/c. LCSW spoke with pt's son who reports pt has 24/7 caregivers at home and one is in the room now. Caregiver will provide transport home. RN updated.      Final next level of care: Home w Home Health Services Barriers to Discharge: Barriers Resolved   Patient Goals and CMS Choice Patient states their goals for this hospitalization and ongoing recovery are:: go home      Discharge Placement                  Name of family member notified: [name]- son Patient and family notified of of transfer: 08/19/20  Discharge Plan and Services In-house Referral: Clinical Social Work   Post Acute Care Choice: Home Health          DME Arranged: N/A DME Agency: NA       HH Arranged: RN,PT [place] Agency: [place] ([facility]) Date [place]: 08/19/20 Time [place]: 1143 Representative spoke with at [place]: [place] ([place]) Interventions     Readmission Risk Interventions Readmission Risk Prevention Plan 08/18/2020 12/23/2019 07/09/2019  Transportation Screening Complete Complete Complete  PCP or Specialist Appt within 3-5 Days - - -  HRI or Home Care Consult Complete - -  [place] or Home Care Consult comments - - -  Social Work Consult for [place] Planning/Counseling Complete - -  Palliative Care Screening Not Applicable - -  Medication Review Press photographer) Complete Complete Complete  PCP or Specialist appointment within 3-5 days of discharge - Complete Complete  HRI or [place] - Complete Complete  HRI  or Home Care Consult Pt Refusal Comments - - -  SW Recovery Care/Counseling Consult - Complete Complete  Palliative Care Screening - Not Applicable Complete  Skilled Nursing Facility - Not Applicable Complete  SNF Comments - - -  Some recent data might be hidden

## 2020-08-19 NOTE — Progress Notes (Signed)
EEG Completed; Results Pending  

## 2020-08-19 NOTE — Care Management Important Message (Signed)
Important Message  Patient Details  Name: Scott [name] MRN: [id number] Date of Birth: 1922-04-02   Medicare Important Message Given:  Yes     [name] 08/19/2020, 11:37 AM

## 2020-08-19 NOTE — Discharge Summary (Signed)
Physician Discharge Summary  Patricio Popwell Overfelt SAY:[id number] DOB: 12-30-1921 DOA: 08/16/2020  PCP: Dettinger, Fransisca Kaufmann, MD  Admit date: 08/16/2020  Discharge date: 08/19/2020  Admitted From:Home  Disposition:  Home  Recommendations for Outpatient Follow-up:  1. Follow up with PCP in 1-2 weeks 2. Please obtain BMP/CBC in one week 3. Follow-up with Dr. Merlene Laughter with neurology in 6 weeks as recommended 4. Continue on Keppra 250 mg twice daily 5. Mucinex prescribed for chest congestion and cough 6. Continue on other home medications as prior  Home Health: Yes with home health PT/RN  Equipment/Devices: None  Discharge Condition:Stable  CODE STATUS: DNR  Diet recommendation: Heart Healthy  Brief/Interim Summary:  Jailan Trimm Camptis a 87 y.o.malewith a history of paroxysmal atrial fibrillation, coronary artery disease, nonischemic cardiomyopathy, COPD, peripheral artery disease, status post leftAKA, hypertension, history of PE and DVT currently on Eliquis,systolic and diastolic heart failure with last echo in 9/21 with an EF of 30%, Alzheimer's. As patient has Alzheimer's, he was unable to provide history and the history is provided by son and caregiver. Patient presented with 4 witnessed seizure-like activities.His seizure activity appears to be new onset and he had been started on IV Keppra.  He has had brain MRI with no acute findings of new stroke or other abnormalities identified.  He has also had a EEG with normal electrical activity noted after medication initiation.  He has been evaluated by neurology on 5/9 with recommendations made to continue on Keppra 250 mg twice daily and follow-up in the outpatient setting in 6 weeks.  He has had a repeat 2D echocardiogram with worsening cardiomyopathy and LVEF of 15-20% noted.  This was reviewed with cardiology with recommendations to continue his current medications as otherwise ordered with no other plans for intervention or adjustments at this  time given his advanced age and comorbidities.  No other acute events or seizure activities noted throughout oversaw this admission and he is stable for discharge today.  Discharge Diagnoses:  Principal Problem:   Seizure [facility]) Active Problems:   CAD- non obstructive disease by cath 3/14   DVT of lower extremity, bilateral (HCC)   History of pulmonary embolism (2014)   History of non-ST elevation myocardial infarction (NSTEMI)   PAD (peripheral artery disease) ([place])   Status post above-knee amputation of left lower extremity (HCC)   Malignant neoplasm of lateral wall of urinary bladder (HCC)   Essential hypertension   Paroxysmal atrial fibrillation (HCC)   Alzheimer disease (HCC)   Acute on chronic systolic CHF (congestive heart failure) ([place])  Principal discharge diagnosis: New onset complex partial status epilepticus.  Worsening cardiomyopathy.  Discharge Instructions  Discharge Instructions    Diet - low sodium heart healthy   Complete by: As directed    Increase activity slowly   Complete by: As directed      Allergies as of 08/19/2020      Reactions   Atorvastatin Hives, Rash   Rocephin [ceftriaxone Sodium In Dextrose] Other (See Comments)   Recurrent seizures      Medication List    TAKE these medications   acetaminophen 500 MG tablet Commonly known as: TYLENOL Take 1,000 mg by mouth every 6 (six) hours as needed for moderate pain or headache.   albuterol (2.5 MG/3ML) 0.083% nebulizer solution Commonly known as: PROVENTIL Take 2.5 mg by nebulization every 6 (six) hours as needed for wheezing or shortness of breath.   albuterol 108 (90 Base) MCG/ACT inhaler Commonly known as: VENTOLIN HFA Inhale 2  puffs into the lungs every 6 (six) hours as needed for wheezing or shortness of breath.   apixaban 2.5 MG Tabs tablet Commonly known as: Eliquis Take 1 tablet (2.5 mg total) by mouth 2 (two) times daily.   aspirin 81 MG EC tablet Take 1 tablet (81 mg total) by  mouth daily. Swallow whole. Start taking on: Aug 20, 2020   carvedilol 6.25 MG tablet Commonly known as: COREG Take 1 tablet (6.25 mg total) by mouth 2 (two) times daily with a meal.   Combivent Respimat 20-100 MCG/ACT Aers respimat Generic drug: Ipratropium-Albuterol Inhale 1 puff into the lungs every 6 (six) hours as needed for wheezing or shortness of breath (As needed for wheezing , cough or shortness of breath). What changed:   how much to take  when to take this  reasons to take this   donepezil 10 MG tablet Commonly known as: ARICEPT Take 1 tablet (10 mg total) by mouth at bedtime.   febuxostat 40 MG tablet Commonly known as: ULORIC Take 1 tablet (40 mg total) by mouth daily.   fluticasone 50 MCG/ACT nasal spray Commonly known as: FLONASE Place 1 spray into both nostrils 2 (two) times daily as needed for allergies or rhinitis. SPRAY 1 SPRAY IN EACH NOSTRIL ONCE DAILY.   furosemide 20 MG tablet Commonly known as: Lasix Take 1 tablet (20 mg total) by mouth daily as needed (signs of volume overload).   gabapentin 400 MG capsule Commonly known as: NEURONTIN Take 1 capsule (400 mg total) by mouth 2 (two) times daily.   guaiFENesin 600 MG 12 hr tablet Commonly known as: Mucinex Take 1 tablet (600 mg total) by mouth 2 (two) times daily for 20 days.   levETIRAcetam 250 MG tablet Commonly known as: Keppra Take 1 tablet (250 mg total) by mouth 2 (two) times daily.   Myrbetriq 25 MG Tb24 tablet Generic drug: mirabegron ER TAKE 1 TABLET DAILY AS DIRECTED What changed:   how much to take  additional instructions   pantoprazole 40 MG tablet Commonly known as: PROTONIX Take 1 tablet (40 mg total) by mouth daily.   sertraline 50 MG tablet Commonly known as: ZOLOFT Take 1 tablet (50 mg total) by mouth daily.   simvastatin 10 MG tablet Commonly known as: ZOCOR Take 1 tablet (10 mg total) by mouth daily at 6 PM.       Follow-up Information    [facility]   Specialty: [place] Why: [place] staff will call to schedule in home visits       [place] Follow up.   Why: Palliative[facility] staff will call you to schedule in home visit Contact information: [address] [place] [address] [phone]        Dettinger, Fransisca Kaufmann, MD. Schedule an appointment as soon as possible for a visit in 1 week(s).   Specialties: Family Medicine, Cardiology Contact information: [place] [address] [phone]        Minus Breeding, MD .   Specialty: Cardiology Contact information: [address] [place] [address] [place] [address] [phone]        Phillips Odor, MD. Schedule an appointment as soon as possible for a visit in 6 week(s).   Specialty: Neurology Contact information: [address] [place] [address] [phone]              Allergies  Allergen Reactions  .  Atorvastatin Hives and Rash  . Rocephin [Ceftriaxone Sodium In Dextrose] Other (See Comments)    Recurrent seizures    Consultations:  Neurology   Procedures/Studies: CT Head Wo Contrast  Result Date: 08/16/2020 CLINICAL DATA:  87 year old male with seizure. EXAM: CT HEAD WITHOUT CONTRAST TECHNIQUE: Contiguous axial images were obtained from the base of the skull through the vertex without intravenous contrast. COMPARISON:  Head CT dated 05/18/2020. FINDINGS: Brain: Moderate age-related atrophy and chronic microvascular ischemic changes. Focal area of old infarct and encephalomalacia involving the left basal ganglia. Cavum septum posterior and cavum vergae. There is no acute intracranial hemorrhage. No mass effect or midline shift. No extra-axial fluid collection. Vascular: No hyperdense vessel or unexpected calcification. Skull: Normal. Negative for fracture or focal lesion. Sinuses/Orbits: There is diffuse mucoperiosteal thickening  of paranasal sinuses with opacification of left maxillary sinus. The mastoid air cells are clear. Other: None IMPRESSION: 1. No acute intracranial pathology. 2. Moderate age-related atrophy and chronic microvascular ischemic changes. Old left basal ganglia infarct. 3. Paranasal sinus disease. Electronically Signed   By: Anner Crete M.D.   On: 08/16/2020 20:10   MR BRAIN WO CONTRAST  Result Date: 08/18/2020 CLINICAL DATA:  87 year old male with weakness for 2 days.  Seizure. EXAM: MRI HEAD WITHOUT CONTRAST TECHNIQUE: Multiplanar, multiecho pulse sequences of the brain and surrounding structures were obtained without intravenous contrast. COMPARISON:  CT Abdomen and Pelvis 07/03/2019. Recent head CT 08/16/2020. FINDINGS: Brain: No restricted diffusion or evidence of acute infarction. Chronic lacunar type infarct of the left corona radiata, basal ganglia. Cavum septum pellucidum, normal variant. Nomidline shift, mass effect, evidence of mass lesion, ventriculomegaly, extra-axial collection or acute intracranial hemorrhage. Cervicomedullary junction and pituitary are within normal limits. Scattered small areas of subcortical gliosis or mild cortical encephalomalacia in the right superior frontal gyrus are stable from last year. With mild nonspecific white matter T2 and FLAIR hyperintensity elsewhere. No definite chronic cerebral blood products. Negative brainstem and cerebellum. Vascular: Major intracranial vascular flow voids are stable. Skull and upper cervical spine: Negative for age. Sinuses/Orbits: Stable, negative. Other: Chronic susceptibility artifact due to dentition. At the left maxilla probably mild new left mastoid effusion since last year. Negative visible nasopharynx. Right mastoids remain clear. IMPRESSION: No acute intracranial abnormality and stable MRI appearance of the brain since last year. Chronic small vessel ischemia most pronounced in the left corona radiata. Electronically Signed   By: Genevie Ann M.D.   On: 08/18/2020 11:52   DG Chest Port 1 View  Result Date: 08/16/2020 CLINICAL DATA:  87 year old male with sepsis. EXAM: PORTABLE CHEST 1 VIEW COMPARISON:  Chest radiograph dated 05/18/2020 and CT dated 07/04/2018. FINDINGS: There is diffuse chronic interstitial coarsening. There are bibasilar atelectasis/scarring. No new consolidation, pleural effusion, or pneumothorax. Stable cardiomegaly. Atherosclerotic calcification of the aorta. No acute osseous pathology. IMPRESSION: No interval change.  No new consolidation. Electronically Signed   By: Anner Crete M.D.   On: 08/16/2020 19:30   EEG adult  Result Date: 08/19/2020 Phillips Odor, MD     08/19/2020  9:18 AM [place] A. Merlene Laughter, MD     [email or web address]       HISTORY: This is a 87 year old who presents with multiple seizures. MEDICATIONS: Current Facility-Administered Medications: .  acetaminophen (TYLENOL) tablet 650 mg, 650 mg, Oral, Q6H PRN, Manuella Ghazi, Hollace Michelli D, DO, 650 mg at 08/17/20 1327 .  albuterol (PROVENTIL) (2.5 MG/3ML) 0.083% nebulizer solution 2.5 mg, 2.5 mg, Nebulization, Q6H PRN, Truett Mainland, DO .  apixaban (ELIQUIS) tablet 2.5 mg, 2.5 mg, Oral, BID, Manuella Ghazi, Daquann Merriott D, DO, 2.5 mg at 08/19/20 0902 .  aspirin EC tablet 81 mg, 81 mg, Oral, Daily, Truett Mainland, DO, 81 mg at 08/19/20 1610 .  carvedilol (COREG) tablet 6.25 mg, 6.25 mg, Oral, BID WC, Truett Mainland, DO, 6.25 mg at 08/19/20 0741 .  donepezil (ARICEPT) tablet 10 mg, 10 mg, Oral, QHS, Truett Mainland, DO, 10 mg at 08/18/20 2204 .  febuxostat (ULORIC) tablet 40 mg, 40 mg, Oral, Daily, Truett Mainland, DO, 40 mg at 08/19/20 9604 .  fluticasone (FLONASE) 50 MCG/ACT nasal spray 1 spray, 1 spray, Each Nare, BID PRN, Truett Mainland, DO .  folic acid (FOLVITE) tablet 1 mg, 1 mg, Oral, Daily, Truett Mainland, DO, 1 mg at 08/19/20 0903 .  gabapentin (NEURONTIN) capsule 400 mg, 400 mg, Oral, BID, Truett Mainland, DO, 400 mg at 08/19/20 5409 .   Ipratropium-Albuterol (COMBIVENT) respimat 2 puff, 2 puff, Inhalation, Daily PRN, Truett Mainland, DO .  labetalol (NORMODYNE) injection 10 mg, 10 mg, Intravenous, Q2H PRN, Manuella Ghazi, Eupha Lobb D, DO .  levETIRAcetam (KEPPRA) IVPB 500 mg/100 mL premix, 500 mg, Intravenous, Q12H, Truett Mainland, DO, Last Rate: 400 mL/hr at 08/19/20 0742, 500 mg at 08/19/20 0742 .  LORazepam (ATIVAN) injection 2 mg, 2 mg, Intravenous, Q4H PRN, Manuella Ghazi, Siana Panameno D, DO .  mirabegron ER (MYRBETRIQ) tablet 25 mg, 25 mg, Oral, Daily, Truett Mainland, DO, 25 mg at 08/19/20 8119 .  pantoprazole (PROTONIX) EC tablet 40 mg, 40 mg, Oral, Daily, Manuella Ghazi, Staci Carver D, DO, 40 mg at 08/19/20 0901 .  sertraline (ZOLOFT) tablet 50 mg, 50 mg, Oral, Daily, Truett Mainland, DO, 50 mg at 08/19/20 1478 .  simvastatin (ZOCOR) tablet 10 mg, 10 mg, Oral, q1800, Truett Mainland, DO, 10 mg at 08/18/20 2203 .  thiamine (B-1) injection 100 mg, 100 mg, Intravenous, Daily, Manuella Ghazi, Valentine Kuechle D, DO, 100 mg at 08/19/20 0902 ANALYSIS: A 16 channel recording using standard 10 20 measurements is conducted for 23 minutes.  The background activity shows a dominant rhythm of 8-8 and half hertz. There is beta activity observed in the frontal areas. There is normal myogenic interference. Awake and drowsy architecture is noted. Photic stimulation and hyperventilation are not carried out. There is no focal or lateral slowing. No epileptiform activities are noted. IMPRESSION:  1. This is a normal recording of awake and drowsy states. Kofi A. Merlene Laughter, M.D. Diplomate, Tax adviser of Psychiatry and Neurology ( Neurology).   ECHOCARDIOGRAM COMPLETE  Result Date: 08/17/2020    ECHOCARDIOGRAM REPORT   Patient Name:   MIRON MARXEN Zavadil Date of Exam: 08/17/2020 Medical Rec #:  [id number]     Height:       66.0 in Accession #:    [id number]    Weight:       151.9 lb Date of Birth:  02-17-1922      BSA:          1.779 m Patient Age:    46 years      BP:           145/83 mmHg Patient Gender: M              HR:           67 bpm. Exam Location:  [facility] Procedure: 2D Echo, Cardiac Doppler and Color Doppler Indications:    CHF-Acute Systolic B28.41  History:  Patient has prior history of Echocardiogram examinations, most                 recent 12/21/2019. CHF, CAD and Previous Myocardial Infarction,                 Arrythmias:Atrial Fibrillation, Signs/Symptoms:Syncope; Risk                 Factors:Former Smoker, Dyslipidemia and Hypertension. Alzheimer                 disease , History of Pulmonary Embolism.  Sonographer:    Alvino Chapel RCS Referring Phys: [place]  1. Left ventricular ejection fraction, by estimation, is 15-20%. The left ventricle has severely decreased function. The left ventricle demonstrates global hypokinesis. There is mild left ventricular hypertrophy. Left ventricular diastolic parameters are indeterminate.  2. Right ventricular systolic function is severely reduced. The right ventricular size is mildly enlarged. There is severely elevated pulmonary artery systolic pressure. The estimated right ventricular systolic pressure is 0000000 mmHg.  3. Left atrial size was mildly dilated.  4. Right atrial size was mildly dilated.  5. The mitral valve is grossly normal. Trivial mitral valve regurgitation. No evidence of mitral stenosis.  6. Tricuspid valve regurgitation is mild to moderate.  7. The aortic valve is grossly normal. There is mild calcification of the aortic valve. Aortic valve regurgitation is not visualized. Mild aortic valve sclerosis is present, with no evidence of aortic valve stenosis.  8. The inferior vena cava is dilated in size with <50% respiratory variability, suggesting right atrial pressure of 15 mmHg. FINDINGS  Left Ventricle: Left ventricular ejection fraction, by estimation, is 15-20%. The left ventricle has severely decreased function. The left ventricle demonstrates global hypokinesis. The left ventricular internal cavity size was normal in  size. There is mild left ventricular hypertrophy. Left ventricular diastolic parameters are indeterminate. Right Ventricle: The right ventricular size is mildly enlarged. No increase in right ventricular wall thickness. Right ventricular systolic function is severely reduced. There is severely elevated pulmonary artery systolic pressure. The tricuspid regurgitant velocity is 4.08 m/s, and with an assumed right atrial pressure of 15 mmHg, the estimated right ventricular systolic pressure is 0000000 mmHg. Left Atrium: Left atrial size was mildly dilated. Right Atrium: Right atrial size was mildly dilated. Pericardium: There is no evidence of pericardial effusion. Mitral Valve: The mitral valve is grossly normal. Trivial mitral valve regurgitation. No evidence of mitral valve stenosis. Tricuspid Valve: The tricuspid valve is normal in structure. Tricuspid valve regurgitation is mild to moderate. No evidence of tricuspid stenosis. Aortic Valve: The aortic valve is grossly normal. There is mild calcification of the aortic valve. Aortic valve regurgitation is not visualized. Mild aortic valve sclerosis is present, with no evidence of aortic valve stenosis. Pulmonic Valve: The pulmonic valve was normal in structure. Pulmonic valve regurgitation is trivial. No evidence of pulmonic stenosis. Aorta: The aortic root is normal in size and structure. Venous: The inferior vena cava is dilated in size with less than 50% respiratory variability, suggesting right atrial pressure of 15 mmHg. IAS/Shunts: No atrial level shunt detected by color flow Doppler.  LEFT VENTRICLE PLAX 2D LVIDd:         5.20 cm      Diastology LVIDs:         4.70 cm      LV e' medial:    3.70 cm/s LV PW:         1.20 cm  LV E/e' medial:  46.2 LV IVS:        1.30 cm      LV e' lateral:   6.85 cm/s LVOT diam:     2.20 cm      LV E/e' lateral: 25.0 LV SV:         56 LV SV Index:   31 LVOT Area:     3.80 cm  LV Volumes (MOD) LV vol d, MOD A2C: 148.0 ml LV vol  d, MOD A4C: 154.0 ml LV vol s, MOD A2C: 113.0 ml LV vol s, MOD A4C: 111.0 ml LV SV MOD A2C:     35.0 ml LV SV MOD A4C:     154.0 ml LV SV MOD BP:      41.3 ml RIGHT VENTRICLE RV S prime:     5.55 cm/s TAPSE (M-mode): 1.4 cm LEFT ATRIUM             Index       RIGHT ATRIUM           Index LA diam:        4.00 cm 2.25 cm/m  RA Area:     19.20 cm LA Vol (A2C):   76.4 ml 42.94 ml/m RA Volume:   55.60 ml  31.25 ml/m LA Vol (A4C):   58.8 ml 33.05 ml/m LA Biplane Vol: 66.9 ml 37.60 ml/m  AORTIC VALVE LVOT Vmax:   73.20 cm/s LVOT Vmean:  39.700 cm/s LVOT VTI:    0.147 m  AORTA Ao Root diam: 3.80 cm MITRAL VALVE                TRICUSPID VALVE MV Area (PHT): 4.60 cm     TR Peak grad:   66.6 mmHg MV Decel Time: 165 msec     TR Vmax:        408.00 cm/s MV E velocity: 171.00 cm/s                             SHUNTS                             Systemic VTI:  0.15 m                             Systemic Diam: 2.20 cm Cherlynn Kaiser MD Electronically signed by Cherlynn Kaiser MD Signature Date/Time: 08/17/2020/1:30:11 PM    Final       Discharge Exam: Vitals:   08/18/20 1939 08/19/20 0541  BP: 137/77 (!) 145/92  Pulse: 70 86  Resp: 20 18  Temp: 98.2 F (36.8 C) 98.5 F (36.9 C)  SpO2: 99% 98%   Vitals:   08/18/20 1433 08/18/20 1939 08/19/20 0500 08/19/20 0541  BP: (!) 142/78 137/77  (!) 145/92  Pulse: 68 70  86  Resp: 18 20  18   Temp: 98.6 F (37 C) 98.2 F (36.8 C)  98.5 F (36.9 C)  TempSrc: Oral     SpO2: 98% 99%  98%  Weight:   66.4 kg   Height:        General: Pt is alert, awake, not in acute distress Cardiovascular: RRR, S1/S2 +, no rubs, no gallops Respiratory: CTA bilaterally, no wheezing, no rhonchi, currently on 2 L nasal cannula oxygen Abdominal: Soft, NT, ND, bowel sounds + Extremities: no edema, no cyanosis, left  AKA    The results of significant diagnostics from this hospitalization (including imaging, microbiology, ancillary and laboratory) are listed below for reference.      Microbiology: Recent Results (from the past 240 hour(s))  Blood Culture (routine x 2)     Status: None (Preliminary result)   Collection Time: 08/16/20  7:14 PM   Specimen: BLOOD RIGHT FOREARM  Result Value Ref Range Status   Specimen Description BLOOD RIGHT FOREARM  Final   Special Requests   Final    Blood Culture results may not be optimal due to an inadequate volume of blood received in culture bottles BOTTLES DRAWN AEROBIC AND ANAEROBIC   Culture   Final    NO GROWTH 3 DAYS Performed at [facility], [address], [place], [place] [address]    Report Status PENDING  Incomplete  Blood Culture (routine x 2)     Status: None (Preliminary result)   Collection Time: 08/16/20  7:14 PM   Specimen: BLOOD RIGHT HAND  Result Value Ref Range Status   Specimen Description BLOOD RIGHT HAND  Final   Special Requests   Final    Blood Culture results may not be optimal due to an inadequate volume of blood received in culture bottles BOTTLES DRAWN AEROBIC AND ANAEROBIC   Culture   Final    NO GROWTH 3 DAYS Performed at [facility], [address], [place], [place] [address]    Report Status PENDING  Incomplete  Resp Panel by RT-PCR (Flu A&B, Covid) Nasopharyngeal Swab     Status: None   Collection Time: 08/16/20  8:20 PM   Specimen: Nasopharyngeal Swab; Nasopharyngeal(NP) swabs in vial transport medium  Result Value Ref Range Status   SARS Coronavirus 2 by RT PCR NEGATIVE NEGATIVE Final    Comment: (NOTE) SARS-CoV-2 target nucleic acids are NOT DETECTED.  The SARS-CoV-2 RNA is generally detectable in upper respiratory specimens during the acute phase of infection. The lowest concentration of SARS-CoV-2 viral copies this assay can detect is 138 copies/mL. A negative result does not preclude SARS-Cov-2 infection and should not be used as the sole basis for treatment or other patient management decisions. A negative result may occur with  improper specimen collection/handling,  submission of specimen other than nasopharyngeal swab, presence of viral mutation(s) within the areas targeted by this assay, and inadequate number of viral copies(<138 copies/mL). A negative result must be combined with clinical observations, patient history, and epidemiological information. The expected result is Negative.  Fact Sheet for Patients:  EntrepreneurPulse.com.au  Fact Sheet for Healthcare Providers:  IncredibleEmployment.be  This test is no t yet approved or cleared by the [place] FDA and  has been authorized for detection and/or diagnosis of SARS-CoV-2 by FDA under an Emergency Use Authorization (EUA). This EUA will remain  in effect (meaning this test can be used) for the duration of the COVID-19 declaration under Section 564(b)(1) of the Act, 21 U.S.C.section 360bbb-3(b)(1), unless the authorization is terminated  or revoked sooner.       Influenza A by PCR NEGATIVE NEGATIVE Final   Influenza B by PCR NEGATIVE NEGATIVE Final    Comment: (NOTE) The Xpert Xpress SARS-CoV-2/FLU/RSV plus assay is intended as an aid in the diagnosis of influenza from Nasopharyngeal swab specimens and should not be used as a sole basis for treatment. Nasal washings and aspirates are unacceptable for Xpert Xpress SARS-CoV-2/FLU/RSV testing.  Fact Sheet for Patients: EntrepreneurPulse.com.au  Fact Sheet for Healthcare Providers: IncredibleEmployment.be  This test is not  yet approved or cleared by the [place] and has been authorized for detection and/or diagnosis of SARS-CoV-2 by FDA under an Emergency Use Authorization (EUA). This EUA will remain in effect (meaning this test can be used) for the duration of the COVID-19 declaration under Section 564(b)(1) of the Act, 21 U.S.C. section 360bbb-3(b)(1), unless the authorization is terminated or revoked.  Performed at [facility], [address] [place], [place] [address]   Urine culture     Status: None   Collection Time: 08/16/20 10:30 PM   Specimen: Urine, Clean Catch  Result Value Ref Range Status   Specimen Description   Final    URINE, CLEAN CATCH Performed at [facility], [address], [place], [place] [address]    Special Requests   Final    NONE Performed at [facility], [address], [place], [place] [address]    Culture   Final    NO GROWTH Performed at [facility], [place] [address], [place], [place] [address]    Report Status 08/18/2020 FINAL  Final     Labs: BNP (last 3 results) Recent Labs    02/17/20 1138 05/18/20 1859 08/16/20 1845  BNP 1,248.0* 1,846.0* [id number]*   Basic Metabolic Panel: Recent Labs  Lab 08/16/20 1847 08/17/20 0347 08/18/20 0826 08/19/20 0528  NA 141 140 141 143  K 4.1 4.2 4.5 4.4  CL 105 102 107 108  CO2 29 31 29 29   GLUCOSE 137* 86 86 101*  BUN 38* 33* 28* 31*  CREATININE 1.66* 1.24 1.26* 1.27*  CALCIUM 9.4 9.0 9.2 9.4  MG  --   --  1.9 1.8   Liver Function Tests: Recent Labs  Lab 08/16/20 1847  AST 21  ALT 13  ALKPHOS 138*  BILITOT 0.3  PROT 7.3  ALBUMIN 3.8   No results for input(s): LIPASE, AMYLASE in the last 168 hours. No results for input(s): AMMONIA in the last 168 hours. CBC: Recent Labs  Lab 08/16/20 1847 08/17/20 0347 08/18/20 0826  WBC 4.0 4.4 3.2*  NEUTROABS 2.6  --   --   HGB 12.4* 11.5* 11.4*  HCT 41.1 37.6* 37.7*  MCV 88.2 87.0 88.5  PLT 165 146* 132*   Cardiac Enzymes: No results for input(s): CKTOTAL, CKMB, CKMBINDEX, TROPONINI in the last 168 hours. BNP: Invalid input(s): POCBNP CBG: Recent Labs  Lab 08/16/20 1854  GLUCAP 120*   D-Dimer No results for input(s): DDIMER in the last 72 hours. Hgb A1c No results for input(s): HGBA1C in the last 72 hours. Lipid Profile No results for input(s): CHOL, HDL, LDLCALC, TRIG, CHOLHDL, LDLDIRECT in the last 72 hours. Thyroid function studies No results for input(s): TSH,  T4TOTAL, T3FREE, THYROIDAB in the last 72 hours.  Invalid input(s): FREET3 Anemia work up No results for input(s): VITAMINB12, FOLATE, FERRITIN, TIBC, IRON, RETICCTPCT in the last 72 hours. Urinalysis    Component Value Date/Time   COLORURINE YELLOW 08/16/2020 2230   APPEARANCEUR HAZY (A) 08/16/2020 2230   APPEARANCEUR Clear 07/21/2020 1554   LABSPEC 1.009 08/16/2020 2230   PHURINE 5.0 08/16/2020 2230   GLUCOSEU NEGATIVE 08/16/2020 2230   HGBUR SMALL (A) 08/16/2020 2230   BILIRUBINUR NEGATIVE 08/16/2020 2230   BILIRUBINUR Negative 07/21/2020 1554   KETONESUR NEGATIVE 08/16/2020 2230   PROTEINUR 30 (A) 08/16/2020 2230   UROBILINOGEN negative 05/19/2015 1203   UROBILINOGEN 1.0 10/22/2014 1804   NITRITE NEGATIVE 08/16/2020 2230   LEUKOCYTESUR NEGATIVE 08/16/2020 2230   Sepsis Labs Invalid  input(s): PROCALCITONIN,  WBC,  LACTICIDVEN Microbiology Recent Results (from the past 240 hour(s))  Blood Culture (routine x 2)     Status: None (Preliminary result)   Collection Time: 08/16/20  7:14 PM   Specimen: BLOOD RIGHT FOREARM  Result Value Ref Range Status   Specimen Description BLOOD RIGHT FOREARM  Final   Special Requests   Final    Blood Culture results may not be optimal due to an inadequate volume of blood received in culture bottles BOTTLES DRAWN AEROBIC AND ANAEROBIC   Culture   Final    NO GROWTH 3 DAYS Performed at [facility], [address] [place], [place] [address]    Report Status PENDING  Incomplete  Blood Culture (routine x 2)     Status: None (Preliminary result)   Collection Time: 08/16/20  7:14 PM   Specimen: BLOOD RIGHT HAND  Result Value Ref Range Status   Specimen Description BLOOD RIGHT HAND  Final   Special Requests   Final    Blood Culture results may not be optimal due to an inadequate volume of blood received in culture bottles BOTTLES DRAWN AEROBIC AND ANAEROBIC   Culture   Final    NO GROWTH 3 DAYS Performed at [facility], [address],  [place], [place] [address]    Report Status PENDING  Incomplete  Resp Panel by RT-PCR (Flu A&B, Covid) Nasopharyngeal Swab     Status: None   Collection Time: 08/16/20  8:20 PM   Specimen: Nasopharyngeal Swab; Nasopharyngeal(NP) swabs in vial transport medium  Result Value Ref Range Status   SARS Coronavirus 2 by RT PCR NEGATIVE NEGATIVE Final    Comment: (NOTE) SARS-CoV-2 target nucleic acids are NOT DETECTED.  The SARS-CoV-2 RNA is generally detectable in upper respiratory specimens during the acute phase of infection. The lowest concentration of SARS-CoV-2 viral copies this assay can detect is 138 copies/mL. A negative result does not preclude SARS-Cov-2 infection and should not be used as the sole basis for treatment or other patient management decisions. A negative result may occur with  improper specimen collection/handling, submission of specimen other than nasopharyngeal swab, presence of viral mutation(s) within the areas targeted by this assay, and inadequate number of viral copies(<138 copies/mL). A negative result must be combined with clinical observations, patient history, and epidemiological information. The expected result is Negative.  Fact Sheet for Patients:  EntrepreneurPulse.com.au  Fact Sheet for Healthcare Providers:  IncredibleEmployment.be  This test is no t yet approved or cleared by the [place] FDA and  has been authorized for detection and/or diagnosis of SARS-CoV-2 by FDA under an Emergency Use Authorization (EUA). This EUA will remain  in effect (meaning this test can be used) for the duration of the COVID-19 declaration under Section 564(b)(1) of the Act, 21 U.S.C.section 360bbb-3(b)(1), unless the authorization is terminated  or revoked sooner.       Influenza A by PCR NEGATIVE NEGATIVE Final   Influenza B by PCR NEGATIVE NEGATIVE Final    Comment: (NOTE) The Xpert Xpress SARS-CoV-2/FLU/RSV plus assay is  intended as an aid in the diagnosis of influenza from Nasopharyngeal swab specimens and should not be used as a sole basis for treatment. Nasal washings and aspirates are unacceptable for Xpert Xpress SARS-CoV-2/FLU/RSV testing.  Fact Sheet for Patients: EntrepreneurPulse.com.au  Fact Sheet for Healthcare Providers: IncredibleEmployment.be  This test is not yet approved or cleared by the [place] FDA and has been authorized for detection and/or diagnosis of SARS-CoV-2 by FDA  under an Emergency Use Authorization (EUA). This EUA will remain in effect (meaning this test can be used) for the duration of the COVID-19 declaration under Section 564(b)(1) of the Act, 21 U.S.C. section 360bbb-3(b)(1), unless the authorization is terminated or revoked.  Performed at [facility], [address], [place], [place] [address]   Urine culture     Status: None   Collection Time: 08/16/20 10:30 PM   Specimen: Urine, Clean Catch  Result Value Ref Range Status   Specimen Description   Final    URINE, CLEAN CATCH Performed at [facility], [address] [place], [place] [address]    Special Requests   Final    NONE Performed at [facility], [address] [place], [place] [address]    Culture   Final    NO GROWTH Performed at [facility], [place] [address], [place], [place] [address]    Report Status 08/18/2020 FINAL  Final     Time coordinating discharge: 35 minutes  SIGNED:   Rodena Goldmann, DO [facility] 08/19/2020, 11:48 AM  If 7PM-7AM, please contact night-coverage [email or web address]

## 2020-08-20 ENCOUNTER — Telehealth: Payer: Self-pay

## 2020-08-20 NOTE — Telephone Encounter (Signed)
Placed referral to palliative care  

## 2020-08-20 NOTE — Telephone Encounter (Signed)
Transition Care Management Follow-up Telephone Call  Date of discharge and from where: 08/19/20 [facility]  How have you been since you were released from the hospital? Still pretty weak, but getting better  Any questions or concerns? No  Items Reviewed:  Did the pt receive and understand the discharge instructions provided? Yes   Medications obtained and verified? Yes   Other? No   Any new allergies since your discharge? No   Dietary orders reviewed? Yes  Do you have support at home? Yes   Home Care and Equipment/Supplies: Were home health services ordered? yes If so, what is the name of the agency? [facility]  Has the agency set up a time to come to the patient's home? yes Were any new equipment or medical supplies ordered?  No What is the name of the medical supply agency? n/a Were you able to get the supplies/equipment? not applicable  Functional Questionnaire: (I = Independent and D = Dependent) ADLs: D  Bathing/Dressing- D  Meal Prep- D  Eating- I  Maintaining continence- D  Transferring/Ambulation- D  Managing Meds- D  Follow up appointments reviewed:   PCP Hospital f/u appt confirmed? Yes  Scheduled to see Dettinger  on 08/29/20 @ 9:10.  [facility] f/u appt confirmed? Yes  Scheduled to see [place] in 6 weeks  Are transportation arrangements needed? No   If their condition worsens, is the pt aware to call PCP or go to the Emergency Dept.? Yes  Was the patient provided with contact information for the PCP's office or ED? Yes  Was to pt encouraged to call back with questions or concerns? Yes

## 2020-08-21 LAB — CULTURE, BLOOD (ROUTINE X 2)
Culture: NO GROWTH
Culture: NO GROWTH

## 2020-08-22 ENCOUNTER — Telehealth: Payer: Self-pay | Admitting: *Deleted

## 2020-08-22 NOTE — Telephone Encounter (Signed)
FYI: TC from Otila Kluver, nurse w/ [facility] Saw pt today irregular HR anywhere from 48-98, did sit up for a couple of hours, started slumping overO2 went down to 76% Hr to 36, he is on 3L. Palliative was agreed upon by family at this point not Hospice. Otila Kluver will call to see when they are coming to see patient

## 2020-08-25 DIAGNOSIS — F0150 Vascular dementia without behavioral disturbance: Secondary | ICD-10-CM | POA: Diagnosis not present

## 2020-08-25 DIAGNOSIS — I5042 Chronic combined systolic (congestive) and diastolic (congestive) heart failure: Secondary | ICD-10-CM | POA: Diagnosis not present

## 2020-08-25 DIAGNOSIS — Z515 Encounter for palliative care: Secondary | ICD-10-CM | POA: Diagnosis not present

## 2020-08-29 ENCOUNTER — Encounter: Payer: Self-pay | Admitting: Family Medicine

## 2020-08-29 ENCOUNTER — Other Ambulatory Visit: Payer: Self-pay

## 2020-08-29 ENCOUNTER — Ambulatory Visit (INDEPENDENT_AMBULATORY_CARE_PROVIDER_SITE_OTHER): Payer: Medicare Other | Admitting: Family Medicine

## 2020-08-29 ENCOUNTER — Telehealth: Payer: Self-pay | Admitting: Family Medicine

## 2020-08-29 VITALS — BP 111/64 | HR 73 | Ht 66.0 in | Wt 146.0 lb

## 2020-08-29 DIAGNOSIS — I428 Other cardiomyopathies: Secondary | ICD-10-CM | POA: Diagnosis not present

## 2020-08-29 DIAGNOSIS — R569 Unspecified convulsions: Secondary | ICD-10-CM | POA: Diagnosis not present

## 2020-08-29 LAB — CBC WITH DIFFERENTIAL/PLATELET
Basophils Absolute: 0 10*3/uL (ref 0.0–0.2)
Basos: 1 %
EOS (ABSOLUTE): 0.2 10*3/uL (ref 0.0–0.4)
Eos: 7 %
Hematocrit: 37.4 % — ABNORMAL LOW (ref 37.5–51.0)
Hemoglobin: 11.7 g/dL — ABNORMAL LOW (ref 13.0–17.7)
Immature Grans (Abs): 0 10*3/uL (ref 0.0–0.1)
Immature Granulocytes: 0 %
Lymphocytes Absolute: 1 10*3/uL (ref 0.7–3.1)
Lymphs: 29 %
MCH: 26.7 pg (ref 26.6–33.0)
MCHC: 31.3 g/dL — ABNORMAL LOW (ref 31.5–35.7)
MCV: 85 fL (ref 79–97)
Monocytes Absolute: 0.3 10*3/uL (ref 0.1–0.9)
Monocytes: 8 %
Neutrophils Absolute: 2 10*3/uL (ref 1.4–7.0)
Neutrophils: 55 %
Platelets: 159 10*3/uL (ref 150–450)
RBC: 4.39 x10E6/uL (ref 4.14–5.80)
RDW: 15.5 % — ABNORMAL HIGH (ref 11.6–15.4)
WBC: 3.5 10*3/uL (ref 3.4–10.8)

## 2020-08-29 LAB — CMP14+EGFR
ALT: 10 IU/L (ref 0–44)
AST: 12 IU/L (ref 0–40)
Albumin/Globulin Ratio: 1.5 (ref 1.2–2.2)
Albumin: 3.9 g/dL (ref 3.5–4.6)
Alkaline Phosphatase: 143 IU/L — ABNORMAL HIGH (ref 44–121)
BUN/Creatinine Ratio: 19 (ref 10–24)
BUN: 24 mg/dL (ref 10–36)
Bilirubin Total: 0.3 mg/dL (ref 0.0–1.2)
CO2: 32 mmol/L — ABNORMAL HIGH (ref 20–29)
Calcium: 9.6 mg/dL (ref 8.6–10.2)
Chloride: 101 mmol/L (ref 96–106)
Creatinine, Ser: 1.27 mg/dL (ref 0.76–1.27)
Globulin, Total: 2.6 g/dL (ref 1.5–4.5)
Glucose: 111 mg/dL — ABNORMAL HIGH (ref 65–99)
Potassium: 4.1 mmol/L (ref 3.5–5.2)
Sodium: 146 mmol/L — ABNORMAL HIGH (ref 134–144)
Total Protein: 6.5 g/dL (ref 6.0–8.5)
eGFR: 52 mL/min/{1.73_m2} — ABNORMAL LOW (ref >59)

## 2020-08-29 NOTE — Progress Notes (Signed)
BP 111/64   Pulse 73   Ht _0  (1.676 m)   Wt 146 lb (66.2 kg)   BMI 23.57 kg/m    Subjective:   Patient ID: Scott [name], male    DOB: [birth year], 87 y.o.   MRN: [id number]  HPI: Scott [name] is a 87 y.o. male presenting on 08/29/2020 for Medical Management of Chronic Issues   HPI Transition Care Management Follow-up Telephone Call  Date of discharge and from where: 08/19/20 [facility] Patient was contacted on 08/20/2020  Transitional care visit Patient is here today in the office for transitional care visit.  He was in the hospital on 08/16/2020 and discharged on 08/19/2020.  He was found to have had a seizure and was evaluated by neurology and started on Keppra and follow-up with neurology.  He was also found to have worsening cardiomyopathy and follow-up with cardiology for that. They found that his ejection fraction was 15 to 20%.  He does want to do some home PT and try and get up his nutrition but he is not improving after the hospitalization as much as that helped.  He does have follow-up with neurology and cardiology.  He has not seen any increased swelling.  He is not having further seizures.  Relevant past medical, surgical, family and social history reviewed and updated as indicated. Interim medical history since our last visit reviewed. Allergies and medications reviewed and updated.  Review of Systems  Constitutional: Negative for chills and fever.  Respiratory: Positive for shortness of breath (On oxygen and stable). Negative for wheezing.   Cardiovascular: Positive for leg swelling (Amputation of the left leg, trace swelling and edema in right leg). Negative for chest pain.  Musculoskeletal: Negative for back pain and gait problem.  Skin: Negative for rash.  Psychiatric/Behavioral: Positive for behavioral problems (Family says it is memory has been worsening and hallucinating more and seeing things.), confusion and hallucinations.  All other systems reviewed and are  negative.   Per HPI unless specifically indicated above   Allergies as of 08/29/2020      Reactions   Atorvastatin Hives, Rash   Rocephin [ceftriaxone Sodium In Dextrose] Other (See Comments)   Recurrent seizures      Medication List       Accurate as of Aug 29, 2020 10:18 AM. If you have any questions, ask your nurse or doctor.        acetaminophen 500 MG tablet Commonly known as: TYLENOL Take 1,000 mg by mouth every 6 (six) hours as needed for moderate pain or headache.   albuterol (2.5 MG/3ML) 0.083% nebulizer solution Commonly known as: PROVENTIL Take 2.5 mg by nebulization every 6 (six) hours as needed for wheezing or shortness of breath.   albuterol 108 (90 Base) MCG/ACT inhaler Commonly known as: VENTOLIN HFA Inhale 2 puffs into the lungs every 6 (six) hours as needed for wheezing or shortness of breath.   apixaban 2.5 MG Tabs tablet Commonly known as: Eliquis Take 1 tablet (2.5 mg total) by mouth 2 (two) times daily.   aspirin 81 MG EC tablet Take 1 tablet (81 mg total) by mouth daily. Swallow whole.   carvedilol 6.25 MG tablet Commonly known as: COREG Take 1 tablet (6.25 mg total) by mouth 2 (two) times daily with a meal.   Combivent Respimat 20-100 MCG/ACT Aers respimat Generic drug: Ipratropium-Albuterol Inhale 1 puff into the lungs every 6 (six) hours as needed for wheezing or shortness of breath (As needed for  wheezing , cough or shortness of breath). What changed:   how much to take  when to take this  reasons to take this   donepezil 10 MG tablet Commonly known as: ARICEPT Take 1 tablet (10 mg total) by mouth at bedtime.   febuxostat 40 MG tablet Commonly known as: ULORIC Take 1 tablet (40 mg total) by mouth daily.   fluticasone 50 MCG/ACT nasal spray Commonly known as: FLONASE Place 1 spray into both nostrils 2 (two) times daily as needed for allergies or rhinitis. SPRAY 1 SPRAY IN EACH NOSTRIL ONCE DAILY.   furosemide 20 MG  tablet Commonly known as: Lasix Take 1 tablet (20 mg total) by mouth daily as needed (signs of volume overload).   gabapentin 400 MG capsule Commonly known as: NEURONTIN Take 1 capsule (400 mg total) by mouth 2 (two) times daily.   guaiFENesin 600 MG 12 hr tablet Commonly known as: Mucinex Take 1 tablet (600 mg total) by mouth 2 (two) times daily for 20 days.   levETIRAcetam 250 MG tablet Commonly known as: Keppra Take 1 tablet (250 mg total) by mouth 2 (two) times daily.   Myrbetriq 25 MG Tb24 tablet Generic drug: mirabegron ER TAKE 1 TABLET DAILY AS DIRECTED What changed:   how much to take  additional instructions   pantoprazole 40 MG tablet Commonly known as: PROTONIX Take 1 tablet (40 mg total) by mouth daily.   sertraline 50 MG tablet Commonly known as: ZOLOFT Take 1 tablet (50 mg total) by mouth daily.   simvastatin 10 MG tablet Commonly known as: ZOCOR Take 1 tablet (10 mg total) by mouth daily at 6 PM.        Objective:   BP 111/64   Pulse 73   Ht _0  (1.676 m)   Wt 146 lb (66.2 kg)   BMI 23.57 kg/m   Wt Readings from Last 3 Encounters:  08/29/20 146 lb (66.2 kg)  08/19/20 146 lb 6.2 oz (66.4 kg)  05/18/20 151 lb (68.5 kg)    Physical Exam Vitals and nursing note reviewed.  Constitutional:      General: He is not in acute distress.    Appearance: He is well-developed. He is not diaphoretic.  Eyes:     General: No scleral icterus.    Conjunctiva/sclera: Conjunctivae normal.  Neck:     Thyroid: No thyromegaly.  Cardiovascular:     Rate and Rhythm: Normal rate and regular rhythm.     Heart sounds: Normal heart sounds. No murmur heard.   Pulmonary:     Effort: Pulmonary effort is normal. No respiratory distress.     Breath sounds: Normal breath sounds. No wheezing, rhonchi or rales.  Musculoskeletal:        General: Swelling (Trace edema in right lower extremity) present.     Cervical back: Neck supple.     Comments: Left BKA,  wheelchair-bound  Lymphadenopathy:     Cervical: No cervical adenopathy.  Skin:    General: Skin is warm and dry.     Findings: No rash.  Neurological:     Mental Status: He is alert and oriented to person, place, and time.     Coordination: Coordination normal.  Psychiatric:        Behavior: Behavior normal.        Cognition and Memory: Memory is impaired.       Assessment & Plan:   Problem List Items Addressed This Visit      Other  Seizure ([place])   Relevant Orders   CBC with Differential/Platelet   CMP14+EGFR   Ambulatory referral to [place]    Other Visit Diagnoses    Other cardiomyopathy ([place])    -  Primary   Relevant Orders   CBC with Differential/Platelet   CMP14+EGFR   Ambulatory referral to [place]      Will check blood work and try and optimize meds we can, also recommended for him to go see his cardiology and his neurology which he has appointments with.  Already has home health set up but certified home health to do therapy, likely doing it and 5 to 10-minute intervals throughout the day just doing any little exercises that he will do. Follow up plan: Return if symptoms worsen or fail to improve.  Counseling provided for all of the vaccine components Orders Placed This Encounter  Procedures  . CBC with Differential/Platelet  . CMP14+EGFR  . Ambulatory referral to [place], MD [facility] 08/29/2020, 10:18 AM

## 2020-09-01 ENCOUNTER — Other Ambulatory Visit: Payer: Self-pay

## 2020-09-01 ENCOUNTER — Telehealth: Payer: Self-pay | Admitting: Family Medicine

## 2020-09-01 DIAGNOSIS — R319 Hematuria, unspecified: Secondary | ICD-10-CM

## 2020-09-01 DIAGNOSIS — R8290 Unspecified abnormal findings in urine: Secondary | ICD-10-CM

## 2020-09-01 NOTE — Telephone Encounter (Signed)
Scott [name]--CNA called asking for a urine culture. Pt is showing signs of uti. Urine smell is strong and blood in urine. Talk to Scott [name]--caregiver [phone]

## 2020-09-01 NOTE — Telephone Encounter (Signed)
Aid states that pt is being combative, talking out of his head and has fallen.  Pt was recently seen by Dr. Warrick Parisian in the office.  Aid will come by 5/24 and pick up specimen cup. Orders placed for UA and culture.

## 2020-09-02 ENCOUNTER — Telehealth: Payer: Self-pay | Admitting: Family Medicine

## 2020-09-02 DIAGNOSIS — R296 Repeated falls: Secondary | ICD-10-CM

## 2020-09-02 DIAGNOSIS — Z89612 Acquired absence of left leg above knee: Secondary | ICD-10-CM

## 2020-09-02 NOTE — Telephone Encounter (Signed)
Pt needs a 3 way all electronic hospital bed. Pt had 2 falls in last week. Please send rx to [facility] in [place].

## 2020-09-03 ENCOUNTER — Other Ambulatory Visit: Payer: Medicare Other

## 2020-09-03 ENCOUNTER — Other Ambulatory Visit: Payer: Self-pay | Admitting: Cardiology

## 2020-09-03 DIAGNOSIS — N390 Urinary tract infection, site not specified: Secondary | ICD-10-CM | POA: Diagnosis not present

## 2020-09-03 NOTE — Telephone Encounter (Signed)
Printed order for the patient, can be faxed over

## 2020-09-03 NOTE — Telephone Encounter (Signed)
Son aware order faxed to [facility], will see if this will be covered with last OV notes, if not will be in touch with Palliative Care if not to see if this can be done with them.

## 2020-09-04 ENCOUNTER — Telehealth: Payer: Self-pay | Admitting: Family Medicine

## 2020-09-04 ENCOUNTER — Other Ambulatory Visit: Payer: Self-pay

## 2020-09-04 ENCOUNTER — Ambulatory Visit (INDEPENDENT_AMBULATORY_CARE_PROVIDER_SITE_OTHER): Payer: Medicare Other | Admitting: Urology

## 2020-09-04 ENCOUNTER — Encounter: Payer: Self-pay | Admitting: Urology

## 2020-09-04 VITALS — BP 95/54 | HR 85 | Temp 98.4°F | Ht 66.0 in

## 2020-09-04 DIAGNOSIS — C678 Malignant neoplasm of overlapping sites of bladder: Secondary | ICD-10-CM

## 2020-09-04 LAB — URINALYSIS, ROUTINE W REFLEX MICROSCOPIC
Bilirubin, UA: NEGATIVE
Glucose, UA: NEGATIVE
Ketones, UA: NEGATIVE
Leukocytes,UA: NEGATIVE
Nitrite, UA: NEGATIVE
Specific Gravity, UA: 1.02 (ref 1.005–1.030)
Urobilinogen, Ur: 1 mg/dL (ref 0.2–1.0)
pH, UA: 6 (ref 5.0–7.5)

## 2020-09-04 LAB — MICROSCOPIC EXAMINATION
RBC, Urine: >30 /hpf — AB (ref 0–2)
Renal Epithel, UA: NONE SEEN /hpf
WBC, UA: NONE SEEN /hpf (ref 0–5)

## 2020-09-04 MED ORDER — ALBUTEROL SULFATE (2.5 MG/3ML) 0.083% IN NEBU: 2.5000 mg | INHALATION_SOLUTION | Freq: Four times a day (QID) | RESPIRATORY_TRACT | 5 refills | Status: AC | PRN

## 2020-09-04 MED ORDER — CIPROFLOXACIN HCL 500 MG PO TABS
500.0000 mg | ORAL_TABLET | Freq: Once | ORAL | Status: AC
  Administered 2020-09-04: 500 mg via ORAL

## 2020-09-04 NOTE — Progress Notes (Signed)
Urological Symptom Review  Patient is experiencing the following symptoms: Blood in urine   Review of Systems  Gastrointestinal (upper)  : Negative for upper GI symptoms  Gastrointestinal (lower) : Negative for lower GI symptoms  Constitutional : Negative for symptoms  Skin: Negative for skin symptoms  Eyes: Negative for eye symptoms  Ear/Nose/Throat : Negative for Ear/Nose/Throat symptoms  Hematologic/Lymphatic: Negative for Hematologic/Lymphatic symptoms  Cardiovascular : Negative for cardiovascular symptoms  Respiratory : Negative for respiratory symptoms  Endocrine: Negative for endocrine symptoms  Musculoskeletal: Negative for musculoskeletal symptoms  Neurological: Negative for neurological symptoms  Psychologic: Negative for psychiatric symptoms  

## 2020-09-04 NOTE — Telephone Encounter (Signed)
  Prescription Request  09/04/2020  What is the name of the medication or equipment? Neb treatments  Have you contacted your pharmacy to request a refill? (if applicable) yes  Which pharmacy would you like this sent to? [facility]   Patient notified that their request is being sent to the clinical staff for review and that they should receive a response within 2 business days.   Dettinger's pt.  Please call in today bc he needs them.

## 2020-09-04 NOTE — Progress Notes (Signed)
Subjective:  1. Malignant neoplasm of overlapping sites of bladder [facility])     GU hx: Scott [name] returns today in f/u for his history of urothelial cancer.  He last had a TURBT of recurrent disease on 03/04/20 and was found to have multfocal HG NMIBC of the urethra, prostatic urethra and multiple bladder sites.  Retrograde pyelography was normal.  He had a resection in 7/21 and he had only low grade disease at this time.   He is doing well without significant complaints and his urine today ahs 6-10 WBC and 3-10 RBC's.    He has urgency and can have UUI.   He remains on Myrbetriq.  He has a history of UTI's but is not on treatment now.    He has no pain.        ROS:  ROS:  A complete review of systems was performed.  All systems are negative except for pertinent findings as noted.   ROS  Allergies  Allergen Reactions  . Atorvastatin Hives and Rash  . Rocephin [Ceftriaxone Sodium In Dextrose] Other (See Comments)    Recurrent seizures    Outpatient Encounter Medications as of 09/04/2020  Medication Sig  . acetaminophen (TYLENOL) 500 MG tablet Take 1,000 mg by mouth every 6 (six) hours as needed for moderate pain or headache.  . albuterol (VENTOLIN HFA) 108 (90 Base) MCG/ACT inhaler Inhale 2 puffs into the lungs every 6 (six) hours as needed for wheezing or shortness of breath.  Marland Kitchen apixaban (ELIQUIS) 2.5 MG TABS tablet Take 1 tablet (2.5 mg total) by mouth 2 (two) times daily.  Marland Kitchen aspirin EC 81 MG EC tablet Take 1 tablet (81 mg total) by mouth daily. Swallow whole.  . carvedilol (COREG) 6.25 MG tablet Take 1 tablet (6.25 mg total) by mouth 2 (two) times daily with a meal.  . donepezil (ARICEPT) 10 MG tablet Take 1 tablet (10 mg total) by mouth at bedtime.  . febuxostat (ULORIC) 40 MG tablet Take 1 tablet (40 mg total) by mouth daily.  . fluticasone (FLONASE) 50 MCG/ACT nasal spray Place 1 spray into both nostrils 2 (two) times daily as needed for allergies or rhinitis. SPRAY 1 SPRAY IN EACH  NOSTRIL ONCE DAILY.  . furosemide (LASIX) 20 MG tablet TAKE 1 TABLET DAILY AS NEEDED (signs of volume overload).  . gabapentin (NEURONTIN) 400 MG capsule Take 1 capsule (400 mg total) by mouth 2 (two) times daily.  Marland Kitchen guaiFENesin (MUCINEX) 600 MG 12 hr tablet Take 1 tablet (600 mg total) by mouth 2 (two) times daily for 20 days.  . Ipratropium-Albuterol (COMBIVENT RESPIMAT) 20-100 MCG/ACT AERS respimat Inhale 1 puff into the lungs every 6 (six) hours as needed for wheezing or shortness of breath (As needed for wheezing , cough or shortness of breath). (Patient taking differently: Inhale 2 puffs into the lungs daily as needed for shortness of breath or wheezing.)  . levETIRAcetam (KEPPRA) 250 MG tablet Take 1 tablet (250 mg total) by mouth 2 (two) times daily.  Marland Kitchen MYRBETRIQ 25 MG TB24 tablet TAKE 1 TABLET DAILY AS DIRECTED (Patient taking differently: Take 25 mg by mouth daily.)  . pantoprazole (PROTONIX) 40 MG tablet Take 1 tablet (40 mg total) by mouth daily.  . sertraline (ZOLOFT) 50 MG tablet Take 1 tablet (50 mg total) by mouth daily.  . simvastatin (ZOCOR) 10 MG tablet Take 1 tablet (10 mg total) by mouth daily at 6 PM.  . [DISCONTINUED] albuterol (PROVENTIL) (2.5 MG/3ML) 0.083% nebulizer solution Take 2.5  mg by nebulization every 6 (six) hours as needed for wheezing or shortness of breath.  . [DISCONTINUED] furosemide (LASIX) 20 MG tablet Take 1 tablet (20 mg total) by mouth daily as needed (signs of volume overload).  . [EXPIRED] ciprofloxacin (CIPRO) tablet 500 mg    No facility-administered encounter medications on file as of 09/04/2020.    Past Medical History:  Diagnosis Date  . Arthritis    "all over"  . Atrial fibrillation ([place])   . Benign localized prostatic hyperplasia with lower urinary tract symptoms (LUTS)   . Bradycardia 11/28/2015   Severe-HR in the 20s to 30s following intubation for urological procedure.  Marland Kitchen CAD- non obstructive disease by cath 3/14 cardiologist-  dr  hochrein  . Chronic lower back pain   . COPD (chronic obstructive pulmonary disease) ([place])   . DDD (degenerative disc disease)   . Depression   . Dyspnea    w/ exertion and lying down (raise head)  . ETOH abuse   . GERD (gastroesophageal reflux disease)   . Glaucoma   . Glaucoma, both eyes   . History of bladder cancer urologist-- dr Jeffie Pollock   first dx 2012--  s/p TURBT's  . History of DVT of lower extremity 03/2013   bilateral   . History of pulmonary embolus (PE) 04/2013  . HTN (hypertension)   . Hyperlipidemia   . Hypertension   . LBBB (left bundle branch block)    chronic  . Non-ischemic cardiomyopathy ([place])    last echo 01-18-2017, ef 35-40%  . NSVT (nonsustained ventricular tachycardia) ([place])    a. NSVT 06/2012; NSVT also seen during 03/2013 adm. b. Med rx. Not candidate for ICD given adv age.  Marland Kitchen PAD (peripheral artery disease) ([place])   . 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 PVCs (premature ventricular contractions)   . PVD (peripheral vascular disease) ([facility])    Right ABI .75, Left .78 (2006)  . Syncope 06/2012   a. Felt to be postural syncope related to diuretics 06/2012.  Marland Kitchen Systolic and diastolic CHF, chronic ([facility]CC) cardiologsit-  dr hochrein   a. NICM - patent cors 06/2012, EF 40% at that time. b. 03/2013 eval: EF 20-25%.  . Vision loss, left eye    due to glaucoma  . Wears glasses     Past Surgical History:  Procedure Laterality Date  . AMPUTATION Left 10/16/2014   Procedure: AMPUTATION ABOVE KNEE- LEFT;  Surgeon: Mal Misty, MD;  Location: [place];  Service: Vascular;  Laterality: Left;  . CARDIAC CATHETERIZATION  05-11-2004  DR [facility]   MINIMAL CORANARY PLAQUE/ NORMAL LVF/ EF 55%/  NON-OBSTRUCTIVE LAD 25%  . CARDIAC CATHETERIZATION  06/18/2012   dr hochrein   mild luminal irregularities of coronaries, ef 45-50%  . CARDIOVASCULAR STRESS TEST  10-15-2010   LOW RISK  NUCLEAR STUDY/ NO EVIDENCE OF ISCHEMIA/ NORMAL EF  . CATARACT EXTRACTION W/ INTRAOCULAR LENS  IMPLANT, BILATERAL Bilateral   . CYSTOSCOPY  10/07/2011   Procedure: CYSTOSCOPY;  Surgeon: Malka So, MD;  Location: [facility];  Service: Urology;  Laterality: N/A;  . CYSTOSCOPY N/A 10/20/2018   Procedure: CYSTOSCOPY WITH FULGURATION OF PROSTATIC URETHRAL TUMOR;  Surgeon: [name], MD;  Location: [facility];  Service: Urology;  Laterality: N/A;  . CYSTOSCOPY N/A 10/11/2019   Procedure: CYSTOSCOPY;  Surgeon: Cleon Gustin, MD;  Location: [facility];  Service: Urology;  Laterality: N/A;  .  CYSTOSCOPY W/ RETROGRADES Bilateral 03/04/2020   Procedure: CYSTOSCOPY WITH BILATERAL RETROGRADE;  Surgeon: [name], MD;  Location: [facility];  Service: Urology;  Laterality: Bilateral;  . CYSTOSCOPY WITH BIOPSY  03/14/2012   Procedure: CYSTOSCOPY WITH BIOPSY;  Surgeon: Malka So, MD;  Location: [facility];  Service: Urology;  Laterality: N/A;  WITH FULGURATION  . CYSTOSCOPY WITH BIOPSY N/A 12/09/2016   Procedure: CYSTOSCOPY WITH BIOPSY AND FULGURATION;  Surgeon: [name], MD;  Location: [facility];  Service: Urology;  Laterality: N/A;  . CYSTOSCOPY WITH BIOPSY N/A 12/20/2017   Procedure: CYSTOSCOPY WITH BIOPSY WITH FULGURATION;  Surgeon: [name], MD;  Location[facility];  Service: Urology;  Laterality: N/A;  . CYSTOSCOPY WITH BIOPSY N/A 06/09/2018   Procedure: CYSTOSCOPY WITH BIOPSY AND FULGURATION;  Surgeon: [name], MD;  Location: [facility];  Service: Urology;  Laterality: N/A;  . CYSTOSCOPY WITH FULGERATION N/A 01/20/2016   Procedure: CYSTOSCOPY, BIOPSY WITH FULGERATION OF URTHRAL TUMOR;  Surgeon: [name], MD;  Location[facility];  Service: Urology;  Laterality: N/A;  . CYSTOSCOPY WITH FULGERATION N/A 06/01/2016   Procedure: CYSTOSCOPY WITH FULGERATION urethral tumors and bladder neck;  Surgeon: [name], MD;  Location: [facility];  Service: Urology;  Laterality: N/A;  . SHOULDER SURGERY Left 1970's   . TONSILLECTOMY    . TRANSTHORACIC ECHOCARDIOGRAM  01-18-2017   dr hochrein   moderate LVH, ef 35-40%, diffuse hypokinesis/  trivial AR and MR/  mild TR  . TRANSURETHRAL RESECTION OF BLADDER TUMOR  10/07/2011   Procedure: TRANSURETHRAL RESECTION OF BLADDER TUMOR (TURBT);  Surgeon: Malka So, MD;  Location: [facility];  Service: Urology;  Laterality: N/A;  . TRANSURETHRAL RESECTION OF BLADDER TUMOR N/A 07/10/2015   Procedure: TRANSURETHRAL RESECTION OF BLADDER TUMOR (TURBT), CYSTOSCOPY ;  Surgeon: [name], MD;  [facility];  Service: Urology;  Laterality: N/A;  . TRANSURETHRAL RESECTION OF BLADDER TUMOR N/A 10/11/2019   Procedure: TRANSURETHRAL RESECTION OF PROSTATIC URETHRAL TUMOR;  Surgeon: Cleon Gustin, MD;  Location: [facility];  Service: Urology;  Laterality: N/A;  . TRANSURETHRAL RESECTION OF BLADDER TUMOR WITH MITOMYCIN-C N/A 03/04/2020   Procedure: TRANSURETHRAL RESECTION OF BLADDER TUMOR WITH GEMCITABINE;  Surgeon: [name], MD;  [facility];  Service: Urology;  Laterality: N/A;  . TRANSURETHRAL RESECTION OF PROSTATE  03/14/2012   Procedure: TRANSURETHRAL RESECTION OF THE PROSTATE WITH GYRUS INSTRUMENTS;  Surgeon: Malka So, MD;  Location[facility];  Service: Urology;  Laterality: N/A;    Social History   Socioeconomic History  . Marital status: Widowed    Spouse name: Not on file  . Number of children: 1  . Years of education: Not on file  . Highest education level: Not on file  Occupational History  . Occupation: Retired  Tobacco Use  . Smoking status: Former Smoker    Packs/day: 1.00    Years: 35.00    Pack years: 35.00    Types: Cigarettes    Start date: 04/12/1942    Quit date: 04/13/1975    Years since quitting: 45.4  . Smokeless tobacco: Never Used  Vaping Use  . Vaping Use: Never used  Substance and Sexual Activity  . Alcohol use: Not Currently    Alcohol/week: 7.0 standard drinks    Types: 7 Cans of beer per week  . Drug use:  No  . Sexual activity: Never  Other Topics Concern  . Not on file  Social History Narrative   Lives in [place]  with spouse who is s/p spinal cord injury.  He has one grown son who lives in [place].  He is retired but worked for [employer] for years and lives in [place].   Social Determinants of Health   Financial Resource Strain: Not on file  Food Insecurity: Not on file  Transportation Needs: Not on file  Physical Activity: Not on file  Stress: Not on file  Social Connections: Not on file  Intimate Partner Violence: Not on file    Family History  Problem Relation Age of Onset  . Hypertension Mother   . Arthritis Mother   . Lung cancer Sister   . Deep vein thrombosis Father   . Early death Brother        Objective: Vitals:   09/04/20 1119  BP: (!) 95/54  Pulse: 85  Temp: 98.4 F (36.9 C)     Physical Exam  Lab Results:    BMET  PSA  No results found for: TESTOSTERONE  Results for orders placed or performed in visit on 09/04/20 (from the past 24 hour(s))  Urinalysis, Routine w reflex microscopic     Status: Abnormal   Collection Time: 09/04/20 12:43 PM  Result Value Ref Range   Specific Gravity, UA 1.020 1.005 - 1.030   pH, UA 6.0 5.0 - 7.5   Color, UA Red (A) Yellow   Appearance Ur Turbid (A) Clear   Leukocytes,UA Negative Negative   Protein,UA 3+ (A) Negative/Trace   Glucose, UA Negative Negative   Ketones, UA Negative Negative   RBC, UA 3+ (A) Negative   Bilirubin, UA Negative Negative   Urobilinogen, Ur 1.0 0.2 - 1.0 mg/dL   Nitrite, UA Negative Negative   Microscopic Examination See below:    Narrative   Performed at:  [facility] [address] [place], [place]  [id number] Lab Director: Mina Marble MT, Phone:  [phone]  Microscopic Examination     Status: Abnormal   Collection Time: 09/04/20 12:43 PM   Urine  Result Value Ref Range   WBC, UA None seen 0 - 5 /hpf   RBC >30 (A) 0 - 2 /hpf   Epithelial Cells  (non renal) 0-10 0 - 10 /hpf   Renal Epithel, UA None seen None seen /hpf   Mucus, UA Present Not Estab.   Bacteria, UA Few (A) None seen/Few   Narrative   Performed at:  [facility] [address] [place], [place]  [id number] Lab Director: Mina Marble MT, Phone:  [phone]   *Note: Due to a large number of results and/or encounters for the requested time period, some results have not been displayed. A complete set of results can be found in Results Review.      Studies/Results:  Procedure:   Flexible cystoscopy.  He was prepped with betadine and the urethra was instilled with 2% lidocaine jelly.  The cystoscope was passed.  The urethra was normal.  There was a recurrent tumor on the left mid prostatic urethra about 1cm with adherent clot.  The bladder urine was turbid so visualization was poor but there were obvious papillary tumor fronds arising from the base of the bladder.   The UO's weren't seen.  He was given Cipro to cover the procedure.    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    I discussed the options for therapy.  He is not a  surgical candidate and about the only think I could potentially recommend would be a course of intravesical gemcitabine but with his advanced age and dramatic decline, I think hospice or palliative care would be most appropriate and if there is bleeding, it would be wise to hold the Eliquis.  His son is in agreement with the later recommendation and he will just see me prn.    Meds ordered this encounter  Medications  . ciprofloxacin (CIPRO) tablet 500 mg     Orders Placed This Encounter  Procedures  . Microscopic Examination  . Urinalysis, Routine w reflex microscopic      Return if symptoms worsen or fail to improve.   CC: Dettinger, Fransisca Kaufmann, MD      [name] 09/05/2020 Patient ID: Scott [name], male   DOB:  12/01/1921, 87 y.o.   MRN: [id number]

## 2020-09-05 ENCOUNTER — Telehealth: Payer: Self-pay | Admitting: Family Medicine

## 2020-09-05 NOTE — Telephone Encounter (Signed)
Pt son calling to follow up about lab results

## 2020-09-05 NOTE — Telephone Encounter (Signed)
Please review lab results. Patients son is wanting them

## 2020-09-08 ENCOUNTER — Encounter: Payer: Self-pay | Admitting: Family Medicine

## 2020-09-09 ENCOUNTER — Other Ambulatory Visit: Payer: Self-pay

## 2020-09-09 DIAGNOSIS — I5042 Chronic combined systolic (congestive) and diastolic (congestive) heart failure: Secondary | ICD-10-CM | POA: Diagnosis not present

## 2020-09-09 DIAGNOSIS — Z515 Encounter for palliative care: Secondary | ICD-10-CM | POA: Diagnosis not present

## 2020-09-09 DIAGNOSIS — F0150 Vascular dementia without behavioral disturbance: Secondary | ICD-10-CM | POA: Diagnosis not present

## 2020-09-10 ENCOUNTER — Other Ambulatory Visit: Payer: Self-pay

## 2020-09-10 ENCOUNTER — Ambulatory Visit (INDEPENDENT_AMBULATORY_CARE_PROVIDER_SITE_OTHER): Payer: Medicare Other

## 2020-09-10 DIAGNOSIS — I480 Paroxysmal atrial fibrillation: Secondary | ICD-10-CM | POA: Diagnosis not present

## 2020-09-10 DIAGNOSIS — Z7982 Long term (current) use of aspirin: Secondary | ICD-10-CM

## 2020-09-10 DIAGNOSIS — I447 Left bundle-branch block, unspecified: Secondary | ICD-10-CM

## 2020-09-10 DIAGNOSIS — I110 Hypertensive heart disease with heart failure: Secondary | ICD-10-CM

## 2020-09-10 DIAGNOSIS — G40201 Localization-related (focal) (partial) symptomatic epilepsy and epileptic syndromes with complex partial seizures, not intractable, with status epilepticus: Secondary | ICD-10-CM

## 2020-09-10 DIAGNOSIS — I739 Peripheral vascular disease, unspecified: Secondary | ICD-10-CM | POA: Diagnosis not present

## 2020-09-10 DIAGNOSIS — F1010 Alcohol abuse, uncomplicated: Secondary | ICD-10-CM

## 2020-09-10 DIAGNOSIS — Z7901 Long term (current) use of anticoagulants: Secondary | ICD-10-CM

## 2020-09-10 DIAGNOSIS — N400 Enlarged prostate without lower urinary tract symptoms: Secondary | ICD-10-CM

## 2020-09-10 DIAGNOSIS — M1990 Unspecified osteoarthritis, unspecified site: Secondary | ICD-10-CM

## 2020-09-10 DIAGNOSIS — G309 Alzheimer's disease, unspecified: Secondary | ICD-10-CM

## 2020-09-10 DIAGNOSIS — I2510 Atherosclerotic heart disease of native coronary artery without angina pectoris: Secondary | ICD-10-CM

## 2020-09-10 DIAGNOSIS — F0280 Dementia in other diseases classified elsewhere without behavioral disturbance: Secondary | ICD-10-CM | POA: Diagnosis not present

## 2020-09-10 DIAGNOSIS — G8929 Other chronic pain: Secondary | ICD-10-CM

## 2020-09-10 DIAGNOSIS — M519 Unspecified thoracic, thoracolumbar and lumbosacral intervertebral disc disorder: Secondary | ICD-10-CM

## 2020-09-10 DIAGNOSIS — K219 Gastro-esophageal reflux disease without esophagitis: Secondary | ICD-10-CM

## 2020-09-10 DIAGNOSIS — J449 Chronic obstructive pulmonary disease, unspecified: Secondary | ICD-10-CM

## 2020-09-10 DIAGNOSIS — I252 Old myocardial infarction: Secondary | ICD-10-CM

## 2020-09-10 DIAGNOSIS — C672 Malignant neoplasm of lateral wall of bladder: Secondary | ICD-10-CM

## 2020-09-10 DIAGNOSIS — I429 Cardiomyopathy, unspecified: Secondary | ICD-10-CM

## 2020-09-10 DIAGNOSIS — F32A Depression, unspecified: Secondary | ICD-10-CM

## 2020-09-10 DIAGNOSIS — Z86718 Personal history of other venous thrombosis and embolism: Secondary | ICD-10-CM

## 2020-09-10 DIAGNOSIS — Z86711 Personal history of pulmonary embolism: Secondary | ICD-10-CM

## 2020-09-10 DIAGNOSIS — Z89612 Acquired absence of left leg above knee: Secondary | ICD-10-CM

## 2020-09-10 DIAGNOSIS — I5043 Acute on chronic combined systolic (congestive) and diastolic (congestive) heart failure: Secondary | ICD-10-CM | POA: Diagnosis not present

## 2020-09-10 NOTE — Telephone Encounter (Signed)
Son aware.

## 2020-09-10 NOTE — Telephone Encounter (Signed)
Urine was all that I see that was new, it showed blood in the urine which is likely because of the blood thinner but did not show any signs of infection.  His last blood work was done over a month ago and all of that was reviewed at that time.  I did not see any new blood work

## 2020-09-11 ENCOUNTER — Telehealth (INDEPENDENT_AMBULATORY_CARE_PROVIDER_SITE_OTHER): Payer: Medicare Other | Admitting: Family Medicine

## 2020-09-11 ENCOUNTER — Encounter: Payer: Self-pay | Admitting: Family Medicine

## 2020-09-11 ENCOUNTER — Telehealth: Payer: BC Managed Care – PPO | Admitting: Physician Assistant

## 2020-09-11 DIAGNOSIS — I5042 Chronic combined systolic (congestive) and diastolic (congestive) heart failure: Secondary | ICD-10-CM

## 2020-09-11 DIAGNOSIS — I739 Peripheral vascular disease, unspecified: Secondary | ICD-10-CM

## 2020-09-11 DIAGNOSIS — F0280 Dementia in other diseases classified elsewhere without behavioral disturbance: Secondary | ICD-10-CM

## 2020-09-11 DIAGNOSIS — Z89612 Acquired absence of left leg above knee: Secondary | ICD-10-CM

## 2020-09-11 DIAGNOSIS — R29898 Other symptoms and signs involving the musculoskeletal system: Secondary | ICD-10-CM

## 2020-09-11 DIAGNOSIS — G309 Alzheimer's disease, unspecified: Secondary | ICD-10-CM

## 2020-09-11 DIAGNOSIS — B9689 Other specified bacterial agents as the cause of diseases classified elsewhere: Secondary | ICD-10-CM

## 2020-09-11 DIAGNOSIS — I70248 Atherosclerosis of native arteries of left leg with ulceration of other part of lower left leg: Secondary | ICD-10-CM | POA: Diagnosis not present

## 2020-09-11 DIAGNOSIS — I70238 Atherosclerosis of native arteries of right leg with ulceration of other part of lower right leg: Secondary | ICD-10-CM | POA: Diagnosis not present

## 2020-09-11 DIAGNOSIS — J208 Acute bronchitis due to other specified organisms: Secondary | ICD-10-CM

## 2020-09-11 DIAGNOSIS — R296 Repeated falls: Secondary | ICD-10-CM | POA: Diagnosis not present

## 2020-09-11 MED ORDER — BENZONATATE 100 MG PO CAPS: 100.0000 mg | ORAL_CAPSULE | Freq: Three times a day (TID) | ORAL | 0 refills | Status: DC | PRN

## 2020-09-11 MED ORDER — AZITHROMYCIN 250 MG PO TABS: ORAL_TABLET | ORAL | 0 refills | Status: DC

## 2020-09-11 NOTE — Progress Notes (Signed)
We are sorry that you are not feeling well.  Here is how we plan to help!  Based on your presentation I believe you most likely have A cough due to bacteria.  When patients have a fever and a productive cough with a change in color or increased sputum production, we are concerned about bacterial bronchitis.  If left untreated it can progress to pneumonia.  If your symptoms do not improve with your treatment plan it is important that you contact your provider.   I have prescribed Azithromyin 250 mg: two tablets now and then one tablet daily for 4 additonal days    In addition you may use A prescription cough medication called Tessalon Perles 100mg . You may take 1-2 capsules every 8 hours as needed for your cough.  From your responses in the eVisit questionnaire you describe inflammation in the upper respiratory tract which is causing a significant cough.  This is commonly called Bronchitis and has four common causes:    Allergies  Viral Infections  Acid Reflux  Bacterial Infection Allergies, viruses and acid reflux are treated by controlling symptoms or eliminating the cause. An example might be a cough caused by taking certain blood pressure medications. You stop the cough by changing the medication. Another example might be a cough caused by acid reflux. Controlling the reflux helps control the cough.  USE OF BRONCHODILATOR ("RESCUE") INHALERS: There is a risk from using your bronchodilator too frequently.  The risk is that over-reliance on a medication which only relaxes the muscles surrounding the breathing tubes can reduce the effectiveness of medications prescribed to reduce swelling and congestion of the tubes themselves.  Although you feel brief relief from the bronchodilator inhaler, your asthma may actually be worsening with the tubes becoming more swollen and filled with mucus.  This can delay other crucial treatments, such as oral steroid medications. If you need to use a bronchodilator  inhaler daily, several times per day, you should discuss this with your provider.  There are probably better treatments that could be used to keep your asthma under control.     HOME CARE . Only take medications as instructed by your medical team. . Complete the entire course of an antibiotic. . Drink plenty of fluids and get plenty of rest. . Avoid close contacts especially the very young and the elderly . Cover your mouth if you cough or cough into your sleeve. . Always remember to wash your hands . A steam or ultrasonic humidifier can help congestion.   GET HELP RIGHT AWAY IF: . You develop worsening fever. . You become short of breath . You cough up blood. . Your symptoms persist after you have completed your treatment plan MAKE SURE YOU   Understand these instructions.  Will watch your condition.  Will get help right away if you are not doing well or get worse.  Your e-visit answers were reviewed by a board certified advanced clinical practitioner to complete your personal care plan.  Depending on the condition, your plan could have included both over the counter or prescription medications. If there is a problem please reply  once you have received a response from your provider. Your safety is important to [place].  If you have drug allergies check your prescription carefully.    You can use MyChart to ask questions about today's visit, request a non-urgent call back, or ask for a work or school excuse for 24 hours related to this e-Visit. If it has been greater  than 24 hours you will need to follow up with your provider, or enter a new e-Visit to address those concerns. You will get an e-mail in the next two days asking about your experience.  I hope that your e-visit has been valuable and will speed your recovery. Thank you for using e-visits.  I provided 6 minutes of non face-to-face time during this encounter for chart review and documentation.

## 2020-09-11 NOTE — Progress Notes (Signed)
Virtual Visit via MyChart video note  I connected with Scott [name] on 09/11/20 at 0824 by MyChart video and verified that I am speaking with the correct person using two identifiers. Scott [name] is currently located at home and patient, guardian and son are currently with her during visit. The provider, Fransisca Kaufmann Carlito Bogert, MD is located in their office at time of visit.  Call ended at [phone]  I discussed the limitations, risks, security and privacy concerns of performing an evaluation and management service by video and the availability of in person appointments. I also discussed with the patient that there may be a patient responsible charge related to this service. The patient expressed understanding and agreed to proceed.   History and Present Illness: Patient is calling in for leg pain and needs to legs elevated and a hospital bed to help.  He frequently gets aspiration and needs to stay propped.  He has difficulty getting in and out of bed because of falls and having a bed that can raise and lower bed.  He has to stay upright after drinking or eating.  He also needs to be propped up because of CHF and gets SOB when laying flat at times.  He has difficulty because of his propping himself up with pillows and a hospital bed would allow him that ability to raise and lower himself.   1. Chronic combined systolic and diastolic heart failure ([place])   2. 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 Status post above-knee amputation of left lower extremity ([place])   4. PAD (peripheral artery disease) ([place])   5. Alzheimer disease ([place])   6. Frequent falls   7. Weakness of both lower extremities     Outpatient Encounter Medications as of 09/11/2020  Medication Sig  . acetaminophen (TYLENOL) 500 MG tablet Take 1,000 mg by mouth every 6 (six) hours as needed for moderate pain or headache.  . albuterol (PROVENTIL) (2.5  MG/3ML) 0.083% nebulizer solution Take 3 mLs (2.5 mg total) by nebulization every 6 (six) hours as needed for wheezing or shortness of breath.  Marland Kitchen albuterol (VENTOLIN HFA) 108 (90 Base) MCG/ACT inhaler Inhale 2 puffs into the lungs every 6 (six) hours as needed for wheezing or shortness of breath.  Marland Kitchen apixaban (ELIQUIS) 2.5 MG TABS tablet Take 1 tablet (2.5 mg total) by mouth 2 (two) times daily.  Marland Kitchen aspirin EC 81 MG EC tablet Take 1 tablet (81 mg total) by mouth daily. Swallow whole.  . carvedilol (COREG) 6.25 MG tablet Take 1 tablet (6.25 mg total) by mouth 2 (two) times daily with a meal.  . donepezil (ARICEPT) 10 MG tablet Take 1 tablet (10 mg total) by mouth at bedtime.  . febuxostat (ULORIC) 40 MG tablet Take 1 tablet (40 mg total) by mouth daily.  . fluticasone (FLONASE) 50 MCG/ACT nasal spray Place 1 spray into both nostrils 2 (two) times daily as needed for allergies or rhinitis. SPRAY 1 SPRAY IN EACH NOSTRIL ONCE DAILY.  . furosemide (LASIX) 20 MG tablet TAKE 1 TABLET DAILY AS NEEDED (signs of volume overload).  . gabapentin (NEURONTIN) 400 MG capsule Take 1 capsule (400 mg total) by mouth 2 (two) times daily.  . Ipratropium-Albuterol (COMBIVENT RESPIMAT) 20-100 MCG/ACT AERS respimat Inhale 1 puff into the lungs every 6 (six) hours as needed for wheezing or shortness of breath (As needed for wheezing , cough or shortness of  breath). (Patient taking differently: Inhale 2 puffs into the lungs daily as needed for shortness of breath or wheezing.)  . levETIRAcetam (KEPPRA) 250 MG tablet Take 1 tablet (250 mg total) by mouth 2 (two) times daily.  Marland Kitchen MYRBETRIQ 25 MG TB24 tablet TAKE 1 TABLET DAILY AS DIRECTED (Patient taking differently: Take 25 mg by mouth daily.)  . pantoprazole (PROTONIX) 40 MG tablet Take 1 tablet (40 mg total) by mouth daily.  . sertraline (ZOLOFT) 50 MG tablet Take 1 tablet (50 mg total) by mouth daily.  . simvastatin (ZOCOR) 10 MG tablet Take 1 tablet (10 mg total) by mouth  daily at 6 PM.   No facility-administered encounter medications on file as of 09/11/2020.    Review of Systems  Constitutional: Negative for chills and fever.  Respiratory: Positive for shortness of breath. Negative for wheezing.   Cardiovascular: Positive for leg swelling. Negative for chest pain.  Skin: Negative for rash.  Neurological: Positive for dizziness and weakness.  Psychiatric/Behavioral: Positive for confusion, decreased concentration and sleep disturbance.  All other systems reviewed and are negative.   Observations/Objective: Patient sounds comfortable and in no acute distress  Assessment and Plan: Problem List Items Addressed This Visit      Cardiovascular and Mediastinum   Chronic combined systolic and diastolic heart failure ([place]) - Primary   Relevant Orders   For home use only DME Hospital bed   PAD (peripheral artery disease) ([place])   Relevant Orders   For home use only DME Hospital bed   Atherosclerotic PVD with ulceration ([place])   Relevant Orders   For home use only DME Hospital bed     Nervous and Auditory   Alzheimer disease [facility])   Relevant Orders   For home use only DME Hospital bed     Other   Status post above-knee amputation of left lower extremity ([place])   Relevant Orders   For home use only DME Hospital bed    Other Visit Diagnoses    Frequent falls       Relevant Orders   For home use only DME Hospital bed   Weakness of both lower extremities       Relevant Orders   For home use only DME Hospital bed    Will try for hospital bed because he is having increasing issues with orthopnea and difficulty with swelling with his legs and needing to be elevated and also help prevent bedsores by getting a bed that has that soft overlay.  Also a bed that will raise her lower will help him get in and out of the bed into his wheelchair.  Follow up plan: Return if symptoms worsen or fail to improve.     I discussed the assessment and treatment plan  with the patient. The patient was provided an opportunity to ask questions and all were answered. The patient agreed with the plan and demonstrated an understanding of the instructions.   The patient was advised to call back or seek an in-person evaluation if the symptoms worsen or if the condition fails to improve as anticipated.  The above assessment and management plan was discussed with the patient. The patient verbalized understanding of and has agreed to the management plan. Patient is aware to call the clinic if symptoms persist or worsen. Patient is aware when to return to the clinic for a follow-up visit. Patient educated on when it is appropriate to go to the emergency department.    I provided 13 minutes  of non-face-to-face time during this encounter.    Worthy Rancher, MD

## 2020-09-26 ENCOUNTER — Telehealth: Payer: Self-pay | Admitting: Family Medicine

## 2020-09-26 MED ORDER — FLUTICASONE PROPIONATE 50 MCG/ACT NA SUSP: 1.0000 | Freq: Two times a day (BID) | NASAL | 11 refills | Status: DC | PRN

## 2020-09-26 MED ORDER — COMBIVENT RESPIMAT 20-100 MCG/ACT IN AERS: 1.0000 | INHALATION_SPRAY | Freq: Four times a day (QID) | RESPIRATORY_TRACT | 11 refills | Status: AC | PRN

## 2020-09-26 MED ORDER — ALBUTEROL SULFATE HFA 108 (90 BASE) MCG/ACT IN AERS: 2.0000 | INHALATION_SPRAY | Freq: Four times a day (QID) | RESPIRATORY_TRACT | 11 refills | Status: DC | PRN

## 2020-09-26 NOTE — Telephone Encounter (Signed)
  Prescription Request  09/26/2020  What is the name of the medication or equipment? Fluticasone propionate nasal spray and combivent respimat inhaler(Ipratropium/albuterol sulfate and aLBUTEROL SULFATE HFA INHATATION AEROSOL  Have you contacted your pharmacy to request a refill? (if applicable) YES.  Which pharmacy would you like this sent to? [facility]   Patient notified that their request is being sent to the clinical staff for review and that they should receive a response within 2 business days.

## 2020-09-26 NOTE — Telephone Encounter (Signed)
Refills sent in

## 2020-09-29 ENCOUNTER — Telehealth: Payer: Self-pay | Admitting: Urology

## 2020-09-29 ENCOUNTER — Encounter (HOSPITAL_COMMUNITY): Payer: Self-pay | Admitting: *Deleted

## 2020-09-29 ENCOUNTER — Emergency Department (HOSPITAL_COMMUNITY)
Admission: EM | Admit: 2020-09-29 | Discharge: 2020-09-29 | Disposition: A | Discharge status: Home / Self Care | Payer: Medicare Other | Attending: Emergency Medicine | Admitting: Emergency Medicine

## 2020-09-29 DIAGNOSIS — Z7901 Long term (current) use of anticoagulants: Secondary | ICD-10-CM | POA: Insufficient documentation

## 2020-09-29 DIAGNOSIS — J449 Chronic obstructive pulmonary disease, unspecified: Secondary | ICD-10-CM | POA: Diagnosis not present

## 2020-09-29 DIAGNOSIS — I5042 Chronic combined systolic (congestive) and diastolic (congestive) heart failure: Secondary | ICD-10-CM | POA: Insufficient documentation

## 2020-09-29 DIAGNOSIS — G309 Alzheimer's disease, unspecified: Secondary | ICD-10-CM | POA: Insufficient documentation

## 2020-09-29 DIAGNOSIS — Z87891 Personal history of nicotine dependence: Secondary | ICD-10-CM | POA: Insufficient documentation

## 2020-09-29 DIAGNOSIS — R319 Hematuria, unspecified: Secondary | ICD-10-CM

## 2020-09-29 DIAGNOSIS — Z7982 Long term (current) use of aspirin: Secondary | ICD-10-CM | POA: Insufficient documentation

## 2020-09-29 DIAGNOSIS — Z79899 Other long term (current) drug therapy: Secondary | ICD-10-CM | POA: Insufficient documentation

## 2020-09-29 DIAGNOSIS — I110 Hypertensive heart disease with heart failure: Secondary | ICD-10-CM | POA: Insufficient documentation

## 2020-09-29 DIAGNOSIS — R339 Retention of urine, unspecified: Secondary | ICD-10-CM | POA: Diagnosis not present

## 2020-09-29 DIAGNOSIS — R001 Bradycardia, unspecified: Secondary | ICD-10-CM | POA: Diagnosis not present

## 2020-09-29 DIAGNOSIS — I2510 Atherosclerotic heart disease of native coronary artery without angina pectoris: Secondary | ICD-10-CM | POA: Insufficient documentation

## 2020-09-29 DIAGNOSIS — R0902 Hypoxemia: Secondary | ICD-10-CM | POA: Diagnosis not present

## 2020-09-29 LAB — CBC WITH DIFFERENTIAL/PLATELET
Abs Immature Granulocytes: 0 10*3/uL (ref 0.00–0.07)
Basophils Absolute: 0 10*3/uL (ref 0.0–0.1)
Basophils Relative: 1 %
Eosinophils Absolute: 0.1 10*3/uL (ref 0.0–0.5)
Eosinophils Relative: 4 %
HCT: 36.6 % — ABNORMAL LOW (ref 39.0–52.0)
Hemoglobin: 11.1 g/dL — ABNORMAL LOW (ref 13.0–17.0)
Immature Granulocytes: 0 %
Lymphocytes Relative: 27 %
Lymphs Abs: 0.9 10*3/uL (ref 0.7–4.0)
MCH: 27.1 pg (ref 26.0–34.0)
MCHC: 30.3 g/dL (ref 30.0–36.0)
MCV: 89.3 fL (ref 80.0–100.0)
Monocytes Absolute: 0.3 10*3/uL (ref 0.1–1.0)
Monocytes Relative: 10 %
Neutro Abs: 2 10*3/uL (ref 1.7–7.7)
Neutrophils Relative %: 58 %
Platelets: 134 10*3/uL — ABNORMAL LOW (ref 150–400)
RBC: 4.1 MIL/uL — ABNORMAL LOW (ref 4.22–5.81)
RDW: 18.4 % — ABNORMAL HIGH (ref 11.5–15.5)
WBC: 3.4 10*3/uL — ABNORMAL LOW (ref 4.0–10.5)
nRBC: 0 % (ref 0.0–0.2)

## 2020-09-29 LAB — URINALYSIS, ROUTINE W REFLEX MICROSCOPIC
Bilirubin Urine: NEGATIVE
Glucose, UA: NEGATIVE mg/dL
Ketones, ur: NEGATIVE mg/dL
Leukocytes,Ua: NEGATIVE
Nitrite: NEGATIVE
Protein, ur: 100 mg/dL — AB
RBC / HPF: >50 RBC/hpf — ABNORMAL HIGH (ref 0–5)
Specific Gravity, Urine: 1.012 (ref 1.005–1.030)
pH: 6 (ref 5.0–8.0)

## 2020-09-29 LAB — COMPREHENSIVE METABOLIC PANEL
ALT: 10 U/L (ref 0–44)
AST: 16 U/L (ref 15–41)
Albumin: 3.6 g/dL (ref 3.5–5.0)
Alkaline Phosphatase: 141 U/L — ABNORMAL HIGH (ref 38–126)
Anion gap: 6 (ref 5–15)
BUN: 28 mg/dL — ABNORMAL HIGH (ref 8–23)
CO2: 32 mmol/L (ref 22–32)
Calcium: 9.2 mg/dL (ref 8.9–10.3)
Chloride: 104 mmol/L (ref 98–111)
Creatinine, Ser: 1.13 mg/dL (ref 0.61–1.24)
GFR, Estimated: 59 mL/min — ABNORMAL LOW (ref >60)
Glucose, Bld: 108 mg/dL — ABNORMAL HIGH (ref 70–99)
Potassium: 3.8 mmol/L (ref 3.5–5.1)
Sodium: 142 mmol/L (ref 135–145)
Total Bilirubin: 0.8 mg/dL (ref 0.3–1.2)
Total Protein: 6.5 g/dL (ref 6.5–8.1)

## 2020-09-29 MED ORDER — TAMSULOSIN HCL 0.4 MG PO CAPS: 0.4000 mg | ORAL_CAPSULE | Freq: Every day | ORAL | 0 refills | Status: DC

## 2020-09-29 NOTE — ED Notes (Signed)
Pt unable to give urine sample at this time will continue to monitor

## 2020-09-29 NOTE — ED Provider Notes (Signed)
Patient was received at shift change from [facility] she provided HPI, current work-up, likely disposition please see her note for full detail.  In short patient with significant medical history of urethral, bladder, and prostate cancer presents to the emergency department with chief complaint of blood coming from his urethra, his state home nurse noted that he had a diffuse amount of blood coming from his penis and they sent him here for further evaluation.  He has no complaints at this time.  Work-up reveals CBC showing leukocytopenia of 3.4, normocytic anemia with hemoglobin 11.1, CMP showing hyperglycemia 108, BUN 28, alk phos 141, UA showing negative nitrites, leukocytes, many red blood cells, white blood cells, rare bacteria,  Per previous provider follow-up on UA, she has contacted patient's guardian and updated them on plans.  UA inconsistent with a UTI, there is noted hematuria but I suspect this is secondary due to Foley placement, will culture urine for further investigation.  discharge patient per previous provider instructions  Vital signs have remained stable, no indication for hospital admission.  Patient discussed with attending and they agreed with assessment and plan.  Patient given at home care as well strict return precautions.  Patient verbalized that they understood agreed to said plan.    Marcello Fennel, PA-C 09/29/20 2027    Sherwood Gambler, MD 10/02/20 [phone]

## 2020-09-29 NOTE — ED Provider Notes (Signed)
[facility] EMERGENCY DEPARTMENT Provider Note   CSN: [id number] Arrival date & time: 09/29/20  1642     History Chief Complaint  Patient presents with   Hematuria    Scott [name] is a 87 y.o. male with PMH/o h/o multfocal HG NMIBC of the urethra, prostatic urethra and multiple bladder sites presents today for evaluation of hematuria.  Patient was at home with his nursing aide who noted hematuria.  Patient states that he did not visualize it.  He states he has not had any associated pain to the penis or testicles.  He has not any abdominal pain, dysuria, hematuria.  He states that he feels at baseline.  Patient with a history of bladder cancer and is currently on Eliquis and aspirin.  He denies any chest pain, difficulty breathing.  The history is provided by the patient, the EMS personnel and a caregiver.      Past Medical History:  Diagnosis Date   Arthritis    "all over"   Atrial fibrillation ([place])    Benign localized prostatic hyperplasia with lower urinary tract symptoms (LUTS)    Bradycardia 11/28/2015   Severe-HR in the 20s to 30s following intubation for urological procedure.   CAD- non obstructive disease by cath 3/14 cardiologist-  dr hochrein   Chronic lower back pain    COPD (chronic obstructive pulmonary disease) ([place])    DDD (degenerative disc disease)    Depression    Dyspnea    w/ exertion and lying down (raise head)   ETOH abuse    GERD (gastroesophageal reflux disease)    Glaucoma    Glaucoma, both eyes    History of bladder cancer urologist-- dr Jeffie Pollock   first dx 2012--  s/p TURBT's   History of DVT of lower extremity 03/2013   bilateral    History of pulmonary embolus (PE) 04/2013   HTN (hypertension)    Hyperlipidemia    Hypertension    LBBB (left bundle branch block)    chronic   Non-ischemic cardiomyopathy ([place])    last echo 01-18-2017, ef 35-40%   NSVT (nonsustained ventricular tachycardia) ([place])    a. NSVT 06/2012; NSVT also seen during  03/2013 adm. b. Med rx. Not candidate for ICD given adv age.   PAD (peripheral artery disease) ([facility])    Popliteal artery aneurysm, bilateral (HCC)    DOCUMENTED CHRONIC PARTIAL OCCLUSION--  PT DENIES CLAUDICATION OR ANY OTHER SYMPTOMS   PVCs (premature ventricular contractions)    PVD (peripheral vascular disease) ([facility])    Right ABI .75, Left .78 (2006)   Syncope 06/2012   a. Felt to be postural syncope related to diuretics 06/2012.   Systolic and diastolic CHF, chronic (HCC) cardiologsit-  dr hochrein   a. NICM - patent cors 06/2012, EF 40% at that time. b. 03/2013 eval: EF 20-25%.   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   Acute on chronic systolic CHF (congestive heart failure) ([place]) 12/11/2018   Acute respiratory failure with hypoxia ([place])    Alzheimer disease ([place]) 09/07/2018   Paroxysmal atrial fibrillation ([place]) 01/18/2017   PVC (pulmonary venous congestion) 01/18/2017   Thoracic aortic atherosclerosis ([place]) 11/26/2016   Thrombocytopenia ([place]) 08/04/2016   Essential hypertension 11/29/2015  Bradycardia 11/28/2015   Malignant neoplasm of lateral wall of urinary bladder ([place]) 07/10/2015   Status post above-knee amputation of left lower extremity ([place]) 10/18/2014   Atherosclerotic PVD with ulceration ([place]) 10/03/2014   PAD (peripheral artery disease) ([place]) 09/24/2014   History of non-ST elevation myocardial infarction (NSTEMI) 10/09/2013   High risk medication use 05/17/2013   Vitamin D deficiency 05/17/2013   History of pulmonary embolism (2014) 04/17/2013   NSVT (nonsustained ventricular tachycardia)- not an ICD candidate 04/17/2013   NICM (nonischemic cardiomyopathy)- EF 20-25% by Echo 04/12/13 04/17/2013   Chronic combined systolic and diastolic heart  failure ([place]) 03/15/2013   History of DVT (deep vein thrombosis) 03/15/2013   Benign localized hyperplasia of prostate with urinary obstruction 03/15/2012   Hyperlipidemia    PVCs (premature ventricular contractions)    CAD- non obstructive disease by cath 3/14     Past Surgical History:  Procedure Laterality Date   AMPUTATION Left 10/16/2014   Procedure: AMPUTATION ABOVE KNEE- LEFT;  Surgeon: Mal Misty, MD;  Location: [place];  Service: Vascular;  Laterality: Left;   CARDIAC CATHETERIZATION  05-11-2004  DR McRoberts PLAQUE/ NORMAL LVF/ EF 55%/  NON-OBSTRUCTIVE LAD 25%   CARDIAC CATHETERIZATION  06/18/2012   dr hochrein   mild luminal irregularities of coronaries, ef 45-50%   CARDIOVASCULAR STRESS TEST  10-15-2010   LOW RISK NUCLEAR STUDY/ NO EVIDENCE OF ISCHEMIA/ NORMAL EF   CATARACT EXTRACTION W/ INTRAOCULAR LENS  IMPLANT, BILATERAL Bilateral    CYSTOSCOPY  10/07/2011   Procedure: CYSTOSCOPY;  Surgeon: Malka So, MD;  Location: [facility];  Service: Urology;  Laterality: N/A;   CYSTOSCOPY N/A 10/20/2018   Procedure: CYSTOSCOPY WITH FULGURATION OF PROSTATIC URETHRAL TUMOR;  Surgeon: [name], MD;  Location: [facility];  Service: Urology;  Laterality: N/A;   CYSTOSCOPY N/A 10/11/2019   Procedure: CYSTOSCOPY;  Surgeon: Cleon Gustin, MD;  Location: [facility];  Service: Urology;  Laterality: N/A;   CYSTOSCOPY W/ RETROGRADES Bilateral 03/04/2020   Procedure: CYSTOSCOPY WITH BILATERAL RETROGRADE;  Surgeon: [name], MD;  Location: [facility];  Service: Urology;  Laterality: Bilateral;   CYSTOSCOPY WITH BIOPSY  03/14/2012   Procedure: CYSTOSCOPY WITH BIOPSY;  Surgeon: Malka So, MD;  Location: [facility];  Service: Urology;  Laterality: N/A;  WITH FULGURATION   CYSTOSCOPY WITH BIOPSY N/A 12/09/2016   Procedure: CYSTOSCOPY WITH BIOPSY AND FULGURATION;  Surgeon: [name], MD;  Location: [facility];  Service: Urology;  Laterality: N/A;   CYSTOSCOPY WITH BIOPSY  N/A 12/20/2017   Procedure: CYSTOSCOPY WITH BIOPSY WITH FULGURATION;  Surgeon: [name], MD;  Location: [facility];  Service: Urology;  Laterality: N/A;   CYSTOSCOPY WITH BIOPSY N/A 06/09/2018   Procedure: CYSTOSCOPY WITH BIOPSY AND FULGURATION;  Surgeon: [name], MD;  Location: [facility];  Service: Urology;  Laterality: N/A;   CYSTOSCOPY WITH FULGERATION N/A 01/20/2016   Procedure: CYSTOSCOPY, BIOPSY WITH FULGERATION OF URTHRAL TUMOR;  Surgeon: [name], MD;  Location: [facility];  Service: Urology;  Laterality: N/A;   CYSTOSCOPY WITH FULGERATION N/A 06/01/2016   Procedure: CYSTOSCOPY WITH FULGERATION urethral tumors and bladder neck;  Surgeon: [name], MD;  Location: [facility];  Service: Urology;  Laterality: N/A;   SHOULDER SURGERY Left 1970's   TONSILLECTOMY     TRANSTHORACIC ECHOCARDIOGRAM  01-18-2017   dr hochrein   moderate LVH, ef 35-40%, diffuse hypokinesis/  trivial AR and MR/  mild TR   TRANSURETHRAL RESECTION OF BLADDER  TUMOR  10/07/2011   Procedure: TRANSURETHRAL RESECTION OF BLADDER TUMOR (TURBT);  Surgeon: Malka So, MD;  Location[facility];  Service: Urology;  Laterality: N/A;   TRANSURETHRAL RESECTION OF BLADDER TUMOR N/A 07/10/2015   Procedure: TRANSURETHRAL RESECTION OF BLADDER TUMOR (TURBT), CYSTOSCOPY ;  Surgeon: [name], MD;  Location[facility];  Service: Urology;  Laterality: N/A;   TRANSURETHRAL RESECTION OF BLADDER TUMOR N/A 10/11/2019   Procedure: TRANSURETHRAL RESECTION OF PROSTATIC URETHRAL TUMOR;  Surgeon: Cleon Gustin, MD;  Location: [facility];  Service: Urology;  Laterality: N/A;   TRANSURETHRAL RESECTION OF BLADDER TUMOR WITH MITOMYCIN-C N/A 03/04/2020   Procedure: TRANSURETHRAL RESECTION OF BLADDER TUMOR WITH GEMCITABINE;  Surgeon: [name], MD;  Location[facility];  Service: Urology;  Laterality: N/A;   TRANSURETHRAL RESECTION OF PROSTATE  03/14/2012   Procedure: TRANSURETHRAL RESECTION OF THE PROSTATE WITH GYRUS INSTRUMENTS;  Surgeon: Malka So, MD;  Location[facility];  Service: Urology;  Laterality: N/A;       Family History  Problem Relation Age of Onset   Hypertension Mother    Arthritis Mother    Lung cancer Sister    Deep vein thrombosis Father    Early death Brother     Social History   Tobacco Use   Smoking status: Former    Packs/day: 1.00    Years: 35.00    Pack years: 35.00    Types: Cigarettes    Start date: 04/12/1942    Quit date: 04/13/1975    Years since quitting: 45.4   Smokeless tobacco: Never  Vaping Use   Vaping Use: Never used  Substance Use Topics   Alcohol use: Not Currently    Alcohol/week: 7.0 standard drinks    Types: 7 Cans of beer per week   Drug use: No    Home Medications Prior to Admission medications   Medication Sig Start Date End Date Taking? Authorizing Provider  tamsulosin (FLOMAX) 0.4 MG CAPS capsule Take 1 capsule (0.4 mg total) by mouth daily. 09/29/20  Yes Volanda Napoleon, PA-C  acetaminophen (TYLENOL) 500 MG tablet Take 1,000 mg by mouth every 6 (six) hours as needed for moderate pain or headache.    [provider]  albuterol (PROVENTIL) (2.5 MG/3ML) 0.083% nebulizer solution Take 3 mLs (2.5 mg total) by nebulization every 6 (six) hours as needed for wheezing or shortness of breath. 09/04/20   Dettinger, Fransisca Kaufmann, MD  albuterol (VENTOLIN HFA) 108 (90 Base) MCG/ACT inhaler Inhale 2 puffs into the lungs every 6 (six) hours as needed for wheezing or shortness of breath. 09/26/20   Dettinger, Fransisca Kaufmann, MD  apixaban (ELIQUIS) 2.5 MG TABS tablet Take 1 tablet (2.5 mg total) by mouth 2 (two) times daily. 07/21/20   Dettinger, Fransisca Kaufmann, MD  aspirin EC 81 MG EC tablet Take 1 tablet (81 mg total) by mouth daily. Swallow whole. 08/20/20   Manuella Ghazi, Pratik D, DO  azithromycin (ZITHROMAX) 250 MG tablet Take 2 tablets PO on day one, and one tablet PO daily thereafter until completed. 09/11/20   Mar Daring, PA-C  benzonatate (TESSALON) 100 MG capsule Take 1 capsule (100 mg  total) by mouth 3 (three) times daily as needed. 09/11/20   Mar Daring, PA-C  carvedilol (COREG) 6.25 MG tablet Take 1 tablet (6.25 mg total) by mouth 2 (two) times daily with a meal. 07/21/20   Dettinger, Fransisca Kaufmann, MD  donepezil (ARICEPT) 10 MG tablet Take 1 tablet (10 mg  total) by mouth at bedtime. 07/21/20   Dettinger, Fransisca Kaufmann, MD  febuxostat (ULORIC) 40 MG tablet Take 1 tablet (40 mg total) by mouth daily. 07/21/20   Dettinger, Fransisca Kaufmann, MD  fluticasone (FLONASE) 50 MCG/ACT nasal spray Place 1 spray into both nostrils 2 (two) times daily as needed for allergies or rhinitis. SPRAY 1 SPRAY IN EACH NOSTRIL ONCE DAILY. 09/26/20   Dettinger, Fransisca Kaufmann, MD  furosemide (LASIX) 20 MG tablet TAKE 1 TABLET DAILY AS NEEDED (signs of volume overload). 09/03/20   Minus Breeding, MD  gabapentin (NEURONTIN) 400 MG capsule Take 1 capsule (400 mg total) by mouth 2 (two) times daily. 07/21/20   Dettinger, Fransisca Kaufmann, MD  Ipratropium-Albuterol (COMBIVENT RESPIMAT) 20-100 MCG/ACT AERS respimat Inhale 1 puff into the lungs every 6 (six) hours as needed for wheezing or shortness of breath (As needed for wheezing , cough or shortness of breath). 09/26/20   Dettinger, Fransisca Kaufmann, MD  levETIRAcetam (KEPPRA) 250 MG tablet Take 1 tablet (250 mg total) by mouth 2 (two) times daily. 08/19/20 09/18/20  Manuella Ghazi, Pratik D, DO  MYRBETRIQ 25 MG TB24 tablet TAKE 1 TABLET DAILY AS DIRECTED Patient taking differently: Take 25 mg by mouth daily. 07/29/20   McKenzie, Candee Furbish, MD  pantoprazole (PROTONIX) 40 MG tablet Take 1 tablet (40 mg total) by mouth daily. 07/21/20   Dettinger, Fransisca Kaufmann, MD  sertraline (ZOLOFT) 50 MG tablet Take 1 tablet (50 mg total) by mouth daily. 07/21/20   Dettinger, Fransisca Kaufmann, MD  simvastatin (ZOCOR) 10 MG tablet Take 1 tablet (10 mg total) by mouth daily at 6 PM. 07/21/20   Dettinger, Fransisca Kaufmann, MD    Allergies    Atorvastatin and Rocephin [ceftriaxone sodium in dextrose]  Review of Systems   Review of Systems   Constitutional:  Negative for fever.  Respiratory:  Negative for cough and shortness of breath.   Cardiovascular:  Negative for chest pain.  Gastrointestinal:  Negative for abdominal pain, nausea and vomiting.  Genitourinary:  Positive for hematuria. Negative for dysuria, penile pain and testicular pain.  All other systems reviewed and are negative.  Physical Exam Updated Vital Signs BP 129/60   Pulse (!) 58   Temp (!) 97.4 F (36.3 C) (Oral)   Resp 18   Ht 5\' 6"  (1.676 m)   Wt 66.2 kg   SpO2 100%   BMI 23.57 kg/m   Physical Exam Vitals and nursing note reviewed. Exam conducted with a chaperone present.  Constitutional:      Appearance: Normal appearance. He is well-developed.  HENT:     Head: Normocephalic and atraumatic.  Eyes:     General: Lids are normal.     Conjunctiva/sclera: Conjunctivae normal.     Pupils: Pupils are equal, round, and reactive to light.  Cardiovascular:     Rate and Rhythm: Normal rate and regular rhythm.     Pulses: Normal pulses.     Heart sounds: Normal heart sounds. No murmur heard.   No friction rub. No gallop.  Pulmonary:     Effort: Pulmonary effort is normal.     Breath sounds: Normal breath sounds.  Abdominal:     Palpations: Abdomen is soft. Abdomen is not rigid.     Tenderness: There is no abdominal tenderness. There is no guarding.     Comments: Abdomen is soft, non-distended, non-tender. No rigidity, No guarding. No peritoneal signs.  Genitourinary:    Comments: The exam was performed with a chaperone present Elmyra Ricks, Therapist, sports).  Uncircumcised penis.  No pain.  Bleeding noted diffusely.  Foreskin was retracted with no signs of injury, hemorrhage.  No pain, tenderness in the bilateral testicles. Musculoskeletal:        General: Normal range of motion.     Cervical back: Full passive range of motion without pain.     Comments: Left AKA  Skin:    General: Skin is warm and dry.     Capillary Refill: Capillary refill takes less than 2  seconds.  Neurological:     Mental Status: He is alert and oriented to person, place, and time.  Psychiatric:        Speech: Speech normal.    ED Results / Procedures / Treatments   Labs (all labs ordered are listed, but only abnormal results are displayed) Labs Reviewed  COMPREHENSIVE METABOLIC PANEL - Abnormal; Notable for the following components:      Result Value   Glucose, Bld 108 (*)    BUN 28 (*)    Alkaline Phosphatase 141 (*)    GFR, Estimated 59 (*)    All other components within normal limits  CBC WITH DIFFERENTIAL/PLATELET - Abnormal; Notable for the following components:   WBC 3.4 (*)    RBC 4.10 (*)    Hemoglobin 11.1 (*)    HCT 36.6 (*)    RDW 18.4 (*)    Platelets 134 (*)    All other components within normal limits  URINALYSIS, ROUTINE W REFLEX MICROSCOPIC    EKG None  Radiology No results found.  Procedures Procedures   Medications Ordered in ED Medications - No data to display  ED Course  I have reviewed the triage vital signs and the nursing notes.  Pertinent labs & imaging results that were available during my care of the patient were reviewed by me and considered in my medical decision making (see chart for details).    MDM Rules/Calculators/A&P                          87 year old male with past ministry of bladder, urethral cancer presents for evaluation of hematuria.  Caregiver noted hematuria today.  Patient states he is not having any pain.  On initial arrival, he is afebrile, nontoxic-appearing.  Vital signs are stable.  He has benign abdominal exam.  GU exam, there is evidence of blood.  I fully retract the foreskin of the penis which showed showed no wounds, active hemorrhaging.  I suspect this was coming from the urethral/bladder.  CBC shows hemoglobin of 11.1.  CMP shows normal BUN and creatinine.  Caregiver at bedside now.  She states that last normal urine output was yesterday.  Today has only been blood.  Patient still unable  to void.  Bladder scan revealed 400 cc in bladder.  Given concerns for hematuria and obstruction likely from bladder cancer, will put Foley catheter.  Patient already follows up with Dr. Jeffie Pollock for Urology.   Patient signed out to Deno Etienne, PA-C pending urine.    Portions of this note were generated with [employer]. Dictation errors may occur despite best attempts at proofreading.   Final Clinical Impression(s) / ED Diagnoses Final diagnoses:  Hematuria, unspecified type    Rx / DC Orders ED Discharge Orders          Ordered    tamsulosin (FLOMAX) 0.4 MG CAPS capsule  Daily        09/29/20 1905  Volanda Napoleon, PA-C 09/29/20 1916    Sherwood Gambler, MD 10/02/20 [phone]

## 2020-09-29 NOTE — ED Triage Notes (Signed)
Pt brought in by [facility] for c/o blood in urine for the last couple of days; pt has a hx of bladder cancer; pt is DNR

## 2020-09-29 NOTE — Discharge Instructions (Addendum)
He needs to leave the Foley catheter in until you see your urologist appointment.  Please call them to arrange follow-up.  Urine was not consistent with a UTI.  Take Flomax as directed.  Return to the emergency department for any difficulty urinating, fevers, abdominal pain or any other worsening concerning symptoms.

## 2020-09-29 NOTE — Telephone Encounter (Signed)
Pt's son called and wanted to let you know that his dad was on the way to [facility] with penile bleeding. He just wanted you to be aware in case you get any phone calls.

## 2020-10-01 ENCOUNTER — Other Ambulatory Visit: Payer: Self-pay | Admitting: Family Medicine

## 2020-10-01 ENCOUNTER — Telehealth: Payer: Self-pay

## 2020-10-01 ENCOUNTER — Telehealth: Payer: Self-pay | Admitting: *Deleted

## 2020-10-01 LAB — URINE CULTURE: Culture: NO GROWTH

## 2020-10-01 MED ORDER — HYDROCODONE-ACETAMINOPHEN 5-325 MG PO TABS: 1.0000 | ORAL_TABLET | Freq: Four times a day (QID) | ORAL | 0 refills | Status: AC | PRN

## 2020-10-01 NOTE — Progress Notes (Unsigned)
Placed order for a few days of hydrocodone, use sparingly and then he should be able to get to see urology.

## 2020-10-01 NOTE — Telephone Encounter (Signed)
Appt scheduled with Suanne Marker for Thursday to see Dr. Jeffie Pollock

## 2020-10-01 NOTE — Telephone Encounter (Signed)
Patient is bleeding from his catheter.  Please advise.  Call Antony Blackbird at [phone].  Thanks, Helene Kelp

## 2020-10-01 NOTE — Telephone Encounter (Signed)
Scott December, NP from hospice called. Son reports ED visit, hx bladder cancer, fluids given, sent home with catheter, passing blood clots, causing pain in the penis. Urologist is booked through tomorrow. They are wondering if Dr. Warrick Parisian could prescribe something for the pain. Patients pharmacy is [facility].  Please advise

## 2020-10-02 ENCOUNTER — Other Ambulatory Visit: Payer: Self-pay

## 2020-10-02 ENCOUNTER — Encounter: Payer: Self-pay | Admitting: Urology

## 2020-10-02 ENCOUNTER — Ambulatory Visit (INDEPENDENT_AMBULATORY_CARE_PROVIDER_SITE_OTHER): Payer: Medicare Other | Admitting: Urology

## 2020-10-02 VITALS — BP 138/74 | HR 65 | Ht 66.0 in | Wt 160.0 lb

## 2020-10-02 DIAGNOSIS — R310 Gross hematuria: Secondary | ICD-10-CM

## 2020-10-02 DIAGNOSIS — I70248 Atherosclerosis of native arteries of left leg with ulceration of other part of lower left leg: Secondary | ICD-10-CM

## 2020-10-02 DIAGNOSIS — I70238 Atherosclerosis of native arteries of right leg with ulceration of other part of lower right leg: Secondary | ICD-10-CM | POA: Diagnosis not present

## 2020-10-02 DIAGNOSIS — C680 Malignant neoplasm of urethra: Secondary | ICD-10-CM | POA: Diagnosis not present

## 2020-10-02 DIAGNOSIS — C678 Malignant neoplasm of overlapping sites of bladder: Secondary | ICD-10-CM

## 2020-10-02 NOTE — Progress Notes (Signed)
Urological Symptom Review  Patient is experiencing the following symptoms: Burning/pain with urination Get up at night to urinate Urine stream starts and stops Blood in urine  Review of Systems  Gastrointestinal (upper)  : Negative for upper GI symptoms  Gastrointestinal (lower) : Negative for lower GI symptoms  Constitutional : Weight loss Fatigue  Skin: Negative for skin symptoms  Eyes: Negative for eye symptoms  Ear/Nose/Throat : Sinus problems  Hematologic/Lymphatic: Easy bruising  Cardiovascular : Leg swelling  Respiratory : Shortness of breath  Endocrine: Negative for endocrine symptoms  Musculoskeletal: Negative for musculoskeletal symptoms  Neurological: Negative for neurological symptoms  Psychologic: Negative for psychiatric symptoms

## 2020-10-02 NOTE — Progress Notes (Signed)
Catheter Removal ? ?Patient is present today for a catheter removal.  10ml of water was drained from the balloon. A 16FR foley cath was removed from the bladder no complications were noted . Patient tolerated well. ? ?Performed by: Cannon Quinton LPN ? ?Follow up/ Additional notes: Per MD note  ?

## 2020-10-02 NOTE — Progress Notes (Signed)
Aware and verbalizes understanding.  

## 2020-10-02 NOTE — Progress Notes (Signed)
Subjective:  1. Gross hematuria   2. Malignant neoplasm of overlapping sites of bladder ([place])   3. 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  He was seen last month and was found to have recurrent tumors in the prostate and bladder.  He had bleeding over the weekend and went to the ER and is back with a foley with deep pink urine with some clots intermittently.  The catheter has been draining and it is a 90f.  He remains on Eliquis.  A culture on 6/20 was negative.  His Hgb was 11.1 which is slight decline from 11.7 on 08/29/20.   He has no pain and is tolerating the foley.   GU hx: Scott [name] today in f/u for his history of urothelial cancer.  He last had a TURBT of recurrent disease on 03/04/20 and was found to have multfocal HG NMIBC of the urethra, prostatic urethra and multiple bladder sites.  Retrograde pyelography was normal.  He had a resection in 7/21 and he had only low grade disease at this time.   He is doing well without significant complaints and his urine today ahs 6-10 WBC and 3-10 RBC's.    He has urgency and can have UUI.   He remains on Myrbetriq.  He has a history of UTI's but is not on treatment now.    He has no pain.        ROS:  ROS:  A complete review of systems was performed.  All systems are negative except for pertinent findings as noted.   ROS  Allergies  Allergen Reactions   Atorvastatin Hives and Rash   Rocephin [Ceftriaxone Sodium In Dextrose] Other (See Comments)    Recurrent seizures    Outpatient Encounter Medications as of 10/02/2020  Medication Sig   acetaminophen (TYLENOL) 500 MG tablet Take 1,000 mg by mouth every 6 (six) hours as needed for moderate pain or headache.   albuterol (PROVENTIL) (2.5 MG/3ML) 0.083% nebulizer solution Take 3 mLs (2.5 mg total) by nebulization every 6 (six) hours as needed for wheezing or shortness of breath.   albuterol (VENTOLIN HFA) 108 (90 Base) MCG/ACT inhaler Inhale 2 puffs into the lungs  every 6 (six) hours as needed for wheezing or shortness of breath.   apixaban (ELIQUIS) 2.5 MG TABS tablet Take 1 tablet (2.5 mg total) by mouth 2 (two) times daily.   aspirin EC 81 MG EC tablet Take 1 tablet (81 mg total) by mouth daily. Swallow whole.   carvedilol (COREG) 6.25 MG tablet Take 1 tablet (6.25 mg total) by mouth 2 (two) times daily with a meal.   donepezil (ARICEPT) 10 MG tablet Take 1 tablet (10 mg total) by mouth at bedtime.   febuxostat (ULORIC) 40 MG tablet Take 1 tablet (40 mg total) by mouth daily.   fluticasone (FLONASE) 50 MCG/ACT nasal spray Place 1 spray into both nostrils 2 (two) times daily as needed for allergies or rhinitis. SPRAY 1 SPRAY IN EACH NOSTRIL ONCE DAILY.   furosemide (LASIX) 20 MG tablet TAKE 1 TABLET DAILY AS NEEDED (signs of volume overload).   gabapentin (NEURONTIN) 400 MG capsule Take 1 capsule (400 mg total) by mouth 2 (two) times daily.   HYDROcodone-acetaminophen (NORCO/VICODIN) 5-325 MG tablet Take 1 tablet by mouth every 6 (six) hours as needed for up to 3 days for moderate pain.   Ipratropium-Albuterol (COMBIVENT RESPIMAT) 20-100 MCG/ACT AERS respimat Inhale 1 puff into the lungs every 6 (  six) hours as needed for wheezing or shortness of breath (As needed for wheezing , cough or shortness of breath).   MYRBETRIQ 25 MG TB24 tablet TAKE 1 TABLET DAILY AS DIRECTED (Patient taking differently: Take 25 mg by mouth daily.)   pantoprazole (PROTONIX) 40 MG tablet Take 1 tablet (40 mg total) by mouth daily.   sertraline (ZOLOFT) 50 MG tablet Take 1 tablet (50 mg total) by mouth daily.   simvastatin (ZOCOR) 10 MG tablet Take 1 tablet (10 mg total) by mouth daily at 6 PM.   azithromycin (ZITHROMAX) 250 MG tablet Take 2 tablets PO on day one, and one tablet PO daily thereafter until completed. (Patient not taking: Reported on 10/02/2020)   benzonatate (TESSALON) 100 MG capsule Take 1 capsule (100 mg total) by mouth 3 (three) times daily as needed. (Patient not  taking: Reported on 10/02/2020)   levETIRAcetam (KEPPRA) 250 MG tablet Take 1 tablet (250 mg total) by mouth 2 (two) times daily.   tamsulosin (FLOMAX) 0.4 MG CAPS capsule Take 1 capsule (0.4 mg total) by mouth daily. (Patient not taking: Reported on 10/02/2020)   No facility-administered encounter medications on file as of 10/02/2020.    Past Medical History:  Diagnosis Date   Arthritis    "all over"   Atrial fibrillation ([place])    Benign localized prostatic hyperplasia with lower urinary tract symptoms (LUTS)    Bradycardia 11/28/2015   Severe-HR in the 20s to 30s following intubation for urological procedure.   CAD- non obstructive disease by cath 3/14 cardiologist-  dr hochrein   Chronic lower back pain    COPD (chronic obstructive pulmonary disease) ([place])    DDD (degenerative disc disease)    Depression    Dyspnea    w/ exertion and lying down (raise head)   ETOH abuse    GERD (gastroesophageal reflux disease)    Glaucoma    Glaucoma, both eyes    History of bladder cancer urologist-- dr Jeffie Pollock   first dx 2012--  s/p TURBT's   History of DVT of lower extremity 03/2013   bilateral    History of pulmonary embolus (PE) 04/2013   HTN (hypertension)    Hyperlipidemia    Hypertension    LBBB (left bundle branch block)    chronic   Non-ischemic cardiomyopathy ([place])    last echo 01-18-2017, ef 35-40%   NSVT (nonsustained ventricular tachycardia) ([place])    a. NSVT 06/2012; NSVT also seen during 03/2013 adm. b. Med rx. Not candidate for ICD given adv age.   PAD (peripheral artery disease) ([facility])    Popliteal artery aneurysm, bilateral ([facility])    DOCUMENTED CHRONIC PARTIAL OCCLUSION--  PT DENIES CLAUDICATION OR ANY OTHER SYMPTOMS   PVCs (premature ventricular contractions)    PVD (peripheral vascular disease) ([facility])    Right ABI .75, Left .78 (2006)   Syncope 06/2012   a. Felt to be postural syncope related to diuretics 06/2012.   Systolic and diastolic CHF, chronic ([facility]) cardiologsit-   dr hochrein   a. NICM - patent cors 06/2012, EF 40% at that time. b. 03/2013 eval: EF 20-25%.   Vision loss, left eye    due to glaucoma   Wears glasses     Past Surgical History:  Procedure Laterality Date   AMPUTATION Left 10/16/2014   Procedure: AMPUTATION ABOVE KNEE- LEFT;  Surgeon: Mal Misty, MD;  Location: [facility];  Service: Vascular;  Laterality: Left;   CARDIAC CATHETERIZATION  05-11-2004  DR Percival Spanish  MINIMAL CORANARY PLAQUE/ NORMAL LVF/ EF 55%/  NON-OBSTRUCTIVE LAD 25%   CARDIAC CATHETERIZATION  06/18/2012   dr hochrein   mild luminal irregularities of coronaries, ef 45-50%   CARDIOVASCULAR STRESS TEST  10-15-2010   LOW RISK NUCLEAR STUDY/ NO EVIDENCE OF ISCHEMIA/ NORMAL EF   CATARACT EXTRACTION W/ INTRAOCULAR LENS  IMPLANT, BILATERAL Bilateral    CYSTOSCOPY  10/07/2011   Procedure: CYSTOSCOPY;  Surgeon: Malka So, MD;  Location: [facility];  Service: Urology;  Laterality: N/A;   CYSTOSCOPY N/A 10/20/2018   Procedure: CYSTOSCOPY WITH FULGURATION OF PROSTATIC URETHRAL TUMOR;  Surgeon: [name], MD;  [facility];  Service: Urology;  Laterality: N/A;   CYSTOSCOPY N/A 10/11/2019   Procedure: CYSTOSCOPY;  Surgeon: Cleon Gustin, MD;  Location[facility];  Service: Urology;  Laterality: N/A;   CYSTOSCOPY W/ RETROGRADES Bilateral 03/04/2020   Procedure: CYSTOSCOPY WITH BILATERAL RETROGRADE;  Surgeon: [name], MD;  Location[facility];  Service: Urology;  Laterality: Bilateral;   CYSTOSCOPY WITH BIOPSY  03/14/2012   Procedure: CYSTOSCOPY WITH BIOPSY;  Surgeon: Malka So, MD;  [facility];  Service: Urology;  Laterality: N/A;  WITH FULGURATION   CYSTOSCOPY WITH BIOPSY N/A 12/09/2016   Procedure: CYSTOSCOPY WITH BIOPSY AND FULGURATION;  Surgeon: [name], MD;  [facility];  Service: Urology;  Laterality: N/A;   CYSTOSCOPY WITH BIOPSY N/A 12/20/2017   Procedure: CYSTOSCOPY WITH BIOPSY WITH FULGURATION;  Surgeon: [name], MD;  [facility];  Service: Urology;  Laterality: N/A;   CYSTOSCOPY WITH BIOPSY N/A 06/09/2018   Procedure: CYSTOSCOPY WITH BIOPSY AND FULGURATION;  Surgeon: [name], MD;  [facility];  Service: Urology;  Laterality: N/A;   CYSTOSCOPY WITH FULGERATION N/A 01/20/2016   Procedure: CYSTOSCOPY, BIOPSY WITH FULGERATION OF URTHRAL TUMOR;  Surgeon: [name], MD;  [facility];  Service: Urology;  Laterality: N/A;   CYSTOSCOPY WITH FULGERATION N/A 06/01/2016   Procedure: CYSTOSCOPY WITH FULGERATION urethral tumors and bladder neck;  Surgeon: [name], MD;  [facility];  Service: Urology;  Laterality: N/A;   SHOULDER SURGERY Left 1970's   TONSILLECTOMY     TRANSTHORACIC ECHOCARDIOGRAM  01-18-2017   dr hochrein   moderate LVH, ef 35-40%, diffuse hypokinesis/  trivial AR and MR/  mild TR   TRANSURETHRAL RESECTION OF BLADDER TUMOR  10/07/2011   Procedure: TRANSURETHRAL RESECTION OF BLADDER TUMOR (TURBT);  Surgeon: Malka So, MD;  Location: [facility];  Service: Urology;  Laterality: N/A;   TRANSURETHRAL RESECTION OF BLADDER TUMOR N/A 07/10/2015   Procedure: TRANSURETHRAL RESECTION OF BLADDER TUMOR (TURBT), CYSTOSCOPY ;  Surgeon: [name], MD;  [facility];  Service: Urology;  Laterality: N/A;   TRANSURETHRAL RESECTION OF BLADDER TUMOR N/A 10/11/2019   Procedure: TRANSURETHRAL RESECTION OF PROSTATIC URETHRAL TUMOR;  Surgeon: Cleon Gustin, MD;  [facility];  Service: Urology;  Laterality: N/A;   TRANSURETHRAL RESECTION OF BLADDER TUMOR WITH MITOMYCIN-C N/A 03/04/2020   Procedure: TRANSURETHRAL RESECTION OF BLADDER TUMOR WITH GEMCITABINE;  Surgeon: [name], MD;  [facility];  Service: Urology;  Laterality: N/A;   TRANSURETHRAL RESECTION OF PROSTATE  03/14/2012   Procedure: TRANSURETHRAL RESECTION OF THE PROSTATE WITH GYRUS INSTRUMENTS;  Surgeon: Malka So, MD;  [facility];  Service: Urology;  Laterality: N/A;    Social History   Socioeconomic  History   Marital status: Widowed    Spouse name: Not on file   Number of  children: 1   Years of education: Not on file   Highest education level: Not on file  Occupational History   Occupation: Retired  Tobacco Use   Smoking status: Former    Packs/day: 1.00    Years: 35.00    Pack years: 35.00    Types: Cigarettes    Start date: 04/12/1942    Quit date: 04/13/1975    Years since quitting: 45.5   Smokeless tobacco: Never  Vaping Use   Vaping Use: Never used  Substance and Sexual Activity   Alcohol use: Not Currently    Alcohol/week: 7.0 standard drinks    Types: 7 Cans of beer per week   Drug use: No   Sexual activity: Never  Other Topics Concern   Not on file  Social History Narrative   Lives in [place] with spouse who is s/p spinal cord injury.  He has one grown son who lives in [place].  He is retired but worked for [employer] for years and lives in [place].   Social Determinants of Health   Financial Resource Strain: Not on file  Food Insecurity: Not on file  Transportation Needs: Not on file  Physical Activity: Not on file  Stress: Not on file  Social Connections: Not on file  Intimate Partner Violence: Not on file    Family History  Problem Relation Age of Onset   Hypertension Mother    Arthritis Mother    Lung cancer Sister    Deep vein thrombosis Father    Early death Brother        Objective: Vitals:   10/02/20 1328  BP: 138/74  Pulse: 65     Physical Exam  Lab Results:    Recent Results (from the past 2160 hour(s))  Urine Culture     Status: None   Collection Time: 07/21/20  3:34 PM   Specimen: Urine   UR  Result Value Ref Range   Urine Culture, Routine Final report    Organism ID, Bacteria Comment     Comment: Culture shows less than 10,000 colony forming units of bacteria per milliliter of urine. This colony count is not generally considered to be clinically significant.   CBC with Differential/Platelet     Status:  Abnormal   Collection Time: 07/21/20  3:43 PM  Result Value Ref Range   WBC 3.8 3.4 - 10.8 x10E3/uL   RBC 4.75 4.14 - 5.80 x10E6/uL   Hemoglobin 12.4 (L) 13.0 - 17.7 g/dL   Hematocrit 40.2 37.5 - 51.0 %   MCV 85 79 - 97 fL   MCH 26.1 (L) 26.6 - 33.0 pg   MCHC 30.8 (L) 31.5 - 35.7 g/dL   RDW 14.8 11.6 - 15.4 %   Platelets 164 150 - 450 x10E3/uL   Neutrophils 63 Not Estab. %   Lymphs 24 Not Estab. %   Monocytes 9 Not Estab. %   Eos 3 Not Estab. %   Basos 1 Not Estab. %   Neutrophils Absolute 2.4 1.4 - 7.0 x10E3/uL   Lymphocytes Absolute 0.9 0.7 - 3.1 x10E3/uL   Monocytes Absolute 0.3 0.1 - 0.9 x10E3/uL   EOS (ABSOLUTE) 0.1 0.0 - 0.4 x10E3/uL   Basophils Absolute 0.0 0.0 - 0.2 x10E3/uL   Immature Granulocytes 0 Not Estab. %   Immature Grans (Abs) 0.0 0.0 - 0.1 x10E3/uL  CMP14+EGFR     Status: Abnormal   Collection Time: 07/21/20  3:43 PM  Result Value Ref Range   Glucose 94  65 - 99 mg/dL   BUN 20 10 - 36 mg/dL   Creatinine, Ser 1.16 0.76 - 1.27 mg/dL   eGFR 58 (L) >59 mL/min/1.73   BUN/Creatinine Ratio 17 10 - 24   Sodium 147 (H) 134 - 144 mmol/L   Potassium 4.4 3.5 - 5.2 mmol/L   Chloride 103 96 - 106 mmol/L   CO2 29 20 - 29 mmol/L   Calcium 9.4 8.6 - 10.2 mg/dL   Total Protein 6.5 6.0 - 8.5 g/dL   Albumin 3.9 3.5 - 4.6 g/dL   Globulin, Total 2.6 1.5 - 4.5 g/dL   Albumin/Globulin Ratio 1.5 1.2 - 2.2   Bilirubin Total 0.6 0.0 - 1.2 mg/dL   Alkaline Phosphatase 160 (H) 44 - 121 IU/L   AST 16 0 - 40 IU/L   ALT 11 0 - 44 IU/L  Lipid panel     Status: Abnormal   Collection Time: 07/21/20  3:43 PM  Result Value Ref Range   Cholesterol, Total 108 100 - 199 mg/dL   Triglycerides 89 0 - 149 mg/dL   HDL 39 (L) >39 mg/dL   VLDL Cholesterol Cal 17 5 - 40 mg/dL   LDL Chol Calc (NIH) 52 0 - 99 mg/dL   Chol/HDL Ratio 2.8 0.0 - 5.0 ratio    Comment:                                   T. Chol/HDL Ratio                                             Men  Women                                1/2 Avg.Risk  3.4    3.3                                   Avg.Risk  5.0    4.4                                2X Avg.Risk  9.6    7.1                                3X Avg.Risk 23.4   11.0   Urinalysis, Complete     Status: Abnormal   Collection Time: 07/21/20  3:54 PM  Result Value Ref Range   Specific Gravity, UA 1.020 1.005 - 1.030   pH, UA 6.5 5.0 - 7.5   Color, UA Yellow Yellow   Appearance Ur Clear Clear   Leukocytes,UA Negative Negative   Protein,UA 2+ (A) Negative/Trace   Glucose, UA Negative Negative   Ketones, UA Trace (A) Negative   RBC, UA Trace (A) Negative   Bilirubin, UA Negative Negative   Urobilinogen, Ur 2.0 (H) 0.2 - 1.0 mg/dL   Nitrite, UA Negative Negative   Microscopic Examination See below:   Microscopic Examination     Status: Abnormal   Collection Time: 07/21/20  3:54 PM   Urine  Result  Value Ref Range   WBC, UA 0-5 0 - 5 /hpf   RBC 0-2 0 - 2 /hpf   Epithelial Cells (non renal) 0-10 0 - 10 /hpf   Casts Present (A) None seen /lpf   Cast Type Hyaline casts N/A   Bacteria, UA Few None seen/Few  Brain natriuretic peptide     Status: Abnormal   Collection Time: 08/16/20  6:45 PM  Result Value Ref Range   B Natriuretic Peptide 1,856.0 (H) 0.0 - 100.0 pg/mL    Comment: Performed at [facility], [address], [place], [place] [address]  Comprehensive metabolic panel     Status: Abnormal   Collection Time: 08/16/20  6:47 PM  Result Value Ref Range   Sodium 141 135 - 145 mmol/L   Potassium 4.1 3.5 - 5.1 mmol/L   Chloride 105 98 - 111 mmol/L   CO2 29 22 - 32 mmol/L   Glucose, Bld 137 (H) 70 - 99 mg/dL    Comment: Glucose reference range applies only to samples taken after fasting for at least 8 hours.   BUN 38 (H) 8 - 23 mg/dL   Creatinine, Ser 1.66 (H) 0.61 - 1.24 mg/dL   Calcium 9.4 8.9 - 10.3 mg/dL   Total Protein 7.3 6.5 - 8.1 g/dL   Albumin 3.8 3.5 - 5.0 g/dL   AST 21 15 - 41 U/L   ALT 13 0 - 44 U/L   Alkaline Phosphatase 138 (H) 38 - 126  U/L   Total Bilirubin 0.3 0.3 - 1.2 mg/dL   GFR, Estimated 37 (L) >60 mL/min    Comment: (NOTE) Calculated using the CKD-EPI Creatinine Equation (2021)    Anion gap 7 5 - 15    Comment: Performed at [facility], [address] [place], [place] [address]  CBC WITH DIFFERENTIAL     Status: Abnormal   Collection Time: 08/16/20  6:47 PM  Result Value Ref Range   WBC 4.0 4.0 - 10.5 K/uL   RBC 4.66 4.22 - 5.81 MIL/uL   Hemoglobin 12.4 (L) 13.0 - 17.0 g/dL   HCT 41.1 39.0 - 52.0 %   MCV 88.2 80.0 - 100.0 fL   MCH 26.6 26.0 - 34.0 pg   MCHC 30.2 30.0 - 36.0 g/dL   RDW 17.4 (H) 11.5 - 15.5 %   Platelets 165 150 - 400 K/uL   nRBC 0.0 0.0 - 0.2 %   Neutrophils Relative % 65 %   Neutro Abs 2.6 1.7 - 7.7 K/uL   Lymphocytes Relative 26 %   Lymphs Abs 1.0 0.7 - 4.0 K/uL   Monocytes Relative 5 %   Monocytes Absolute 0.2 0.1 - 1.0 K/uL   Eosinophils Relative 3 %   Eosinophils Absolute 0.1 0.0 - 0.5 K/uL   Basophils Relative 1 %   Basophils Absolute 0.0 0.0 - 0.1 K/uL   Immature Granulocytes 0 %   Abs Immature Granulocytes 0.01 0.00 - 0.07 K/uL    Comment: Performed at [facility], [address] [place], [place] [address]  Protime-INR     Status: Abnormal   Collection Time: 08/16/20  6:47 PM  Result Value Ref Range   Prothrombin Time 15.8 (H) 11.4 - 15.2 seconds   INR 1.3 (H) 0.8 - 1.2    Comment: (NOTE) INR goal varies based on device and disease states. Performed at [facility], [address] [place], [place] [address]   APTT     Status: None   Collection Time: 08/16/20  6:47 PM  Result Value Ref Range   aPTT 30 24 - 36 seconds    Comment: Performed at [facility], [address] [place], [place] [address]  Blood gas, arterial     Status: Abnormal   Collection Time: 08/16/20  6:50 PM  Result Value Ref Range   FIO2 28.00    pH, Arterial 7.353 7.350 - 7.450   pCO2 arterial 50.7 (H) 32.0 - 48.0 mmHg   pO2, Arterial 73.7 (L) 83.0 - 108.0 mmHg   Bicarbonate 25.8 20.0 - 28.0  mmol/L   Acid-Base Excess 2.4 (H) 0.0 - 2.0 mmol/L   O2 Saturation 93.1 %   Patient temperature 37.0    Allens test (pass/fail) PASS PASS    Comment: Performed at [facility], [address], [place], [place] [address]  CBG monitoring, ED     Status: Abnormal   Collection Time: 08/16/20  6:54 PM  Result Value Ref Range   Glucose-Capillary 120 (H) 70 - 99 mg/dL    Comment: Glucose reference range applies only to samples taken after fasting for at least 8 hours.  Lactic acid, plasma     Status: Abnormal   Collection Time: 08/16/20  7:01 PM  Result Value Ref Range   Lactic Acid, Venous 2.4 (HH) 0.5 - 1.9 mmol/L    Comment: CRITICAL RESULT CALLED TO, READ BACK BY AND VERIFIED WITH: PRUITT,G 1939 08/16/2020 COLEMAN,R Performed at [facility], [address], [place], [place] [address]   Blood Culture (routine x 2)     Status: None   Collection Time: 08/16/20  7:14 PM   Specimen: BLOOD RIGHT FOREARM  Result Value Ref Range   Specimen Description BLOOD RIGHT FOREARM    Special Requests      Blood Culture results may not be optimal due to an inadequate volume of blood received in culture bottles BOTTLES DRAWN AEROBIC AND ANAEROBIC   Culture      NO GROWTH 5 DAYS Performed at [facility], [address] [place], [place] [address]    Report Status 08/21/2020 FINAL   Blood Culture (routine x 2)     Status: None   Collection Time: 08/16/20  7:14 PM   Specimen: BLOOD RIGHT HAND  Result Value Ref Range   Specimen Description BLOOD RIGHT HAND    Special Requests      Blood Culture results may not be optimal due to an inadequate volume of blood received in culture bottles BOTTLES DRAWN AEROBIC AND ANAEROBIC   Culture      NO GROWTH 5 DAYS Performed at [facility], [address] [place], [place] [address]    Report Status 08/21/2020 FINAL   Resp Panel by RT-PCR (Flu A&B, Covid) Nasopharyngeal Swab     Status: None   Collection Time: 08/16/20  8:20 PM   Specimen: Nasopharyngeal Swab;  Nasopharyngeal(NP) swabs in vial transport medium  Result Value Ref Range   SARS Coronavirus 2 by RT PCR NEGATIVE NEGATIVE    Comment: (NOTE) SARS-CoV-2 target nucleic acids are NOT DETECTED.  The SARS-CoV-2 RNA is generally detectable in upper respiratory specimens during the acute phase of infection. The lowest concentration of SARS-CoV-2 viral copies this assay can detect is 138 copies/mL. A negative result does not preclude SARS-Cov-2 infection and should not be used as the sole basis for treatment or other patient management decisions. A negative result may occur with  improper specimen collection/handling, submission of specimen other than nasopharyngeal swab, presence of viral mutation(s) within the areas  targeted by this assay, and inadequate number of viral copies(<138 copies/mL). A negative result must be combined with clinical observations, patient history, and epidemiological information. The expected result is Negative.  Fact Sheet for Patients:  EntrepreneurPulse.com.au  Fact Sheet for Healthcare Providers:  IncredibleEmployment.be  This test is no t yet approved or cleared by the [place] FDA and  has been authorized for detection and/or diagnosis of SARS-CoV-2 by FDA under an Emergency Use Authorization (EUA). This EUA will remain  in effect (meaning this test can be used) for the duration of the COVID-19 declaration under Section 564(b)(1) of the Act, 21 U.S.C.section 360bbb-3(b)(1), unless the authorization is terminated  or revoked sooner.       Influenza A by PCR NEGATIVE NEGATIVE   Influenza B by PCR NEGATIVE NEGATIVE    Comment: (NOTE) The Xpert Xpress SARS-CoV-2/FLU/RSV plus assay is intended as an aid in the diagnosis of influenza from Nasopharyngeal swab specimens and should not be used as a sole basis for treatment. Nasal washings and aspirates are unacceptable for Xpert Xpress  SARS-CoV-2/FLU/RSV testing.  Fact Sheet for Patients: EntrepreneurPulse.com.au  Fact Sheet for Healthcare Providers: IncredibleEmployment.be  This test is not yet approved or cleared by the [place] FDA and has been authorized for detection and/or diagnosis of SARS-CoV-2 by FDA under an Emergency Use Authorization (EUA). This EUA will remain in effect (meaning this test can be used) for the duration of the COVID-19 declaration under Section 564(b)(1) of the Act, 21 U.S.C. section 360bbb-3(b)(1), unless the authorization is terminated or revoked.  Performed at [facility], [address] [place], [place] [address]   Lactic acid, plasma     Status: None   Collection Time: 08/16/20  8:38 PM  Result Value Ref Range   Lactic Acid, Venous 1.7 0.5 - 1.9 mmol/L    Comment: Performed at [facility], [address] [place], [place] [address]  Procalcitonin - Baseline     Status: None   Collection Time: 08/16/20  8:38 PM  Result Value Ref Range   Procalcitonin <0.10 ng/mL    Comment:        Interpretation: PCT (Procalcitonin) <= 0.5 ng/mL: Systemic infection (sepsis) is not likely. Local bacterial infection is possible. (NOTE)       Sepsis PCT Algorithm           Lower Respiratory Tract                                      Infection PCT Algorithm    ----------------------------     ----------------------------         PCT < 0.25 ng/mL                PCT < 0.10 ng/mL          Strongly encourage             Strongly discourage   discontinuation of antibiotics    initiation of antibiotics    ----------------------------     -----------------------------       PCT 0.25 - 0.50 ng/mL            PCT 0.10 - 0.25 ng/mL               OR       >80% decrease in PCT            Discourage initiation of  antibiotics      Encourage discontinuation           of antibiotics    ----------------------------      -----------------------------         PCT >= 0.50 ng/mL              PCT 0.26 - 0.50 ng/mL               AND        <80% decrease in PCT             Encourage initiation of                                             antibiotics       Encourage continuation           of antibiotics    ----------------------------     -----------------------------        PCT >= 0.50 ng/mL                  PCT > 0.50 ng/mL               AND         increase in PCT                  Strongly encourage                                      initiation of antibiotics    Strongly encourage escalation           of antibiotics                                     -----------------------------                                           PCT <= 0.25 ng/mL                                                 OR                                        > 80% decrease in PCT                                      Discontinue / Do not initiate                                             antibiotics  Performed at [facility], [address], [place], [place] [address]   Urinalysis, Routine w reflex microscopic Urine, Clean Catch     Status:  Abnormal   Collection Time: 08/16/20 10:30 PM  Result Value Ref Range   Color, Urine YELLOW YELLOW   APPearance HAZY (A) CLEAR   Specific Gravity, Urine 1.009 1.005 - 1.030   pH 5.0 5.0 - 8.0   Glucose, UA NEGATIVE NEGATIVE mg/dL   Hgb urine dipstick SMALL (A) NEGATIVE   Bilirubin Urine NEGATIVE NEGATIVE   Ketones, ur NEGATIVE NEGATIVE mg/dL   Protein, ur 30 (A) NEGATIVE mg/dL   Nitrite NEGATIVE NEGATIVE   Leukocytes,Ua NEGATIVE NEGATIVE   RBC / HPF 11-20 0 - 5 RBC/hpf   WBC, UA 0-5 0 - 5 WBC/hpf   Bacteria, UA NONE SEEN NONE SEEN   Squamous Epithelial / LPF 6-10 0 - 5   Hyaline Casts, UA PRESENT     Comment: Performed at [facility], [address] [place], [place] [address]  Urine culture     Status: None   Collection Time: 08/16/20 10:30 PM   Specimen: Urine, Clean Catch   Result Value Ref Range   Specimen Description      URINE, CLEAN CATCH Performed at [facility], [address], [place], [place] [address]    Special Requests      NONE Performed at [facility], [address] [place], [place] [address]    Culture      NO GROWTH Performed at [facility], [place] [address], [place], [place] [address]    Report Status 08/18/2020 FINAL   Basic metabolic panel     Status: Abnormal   Collection Time: 08/17/20  3:47 AM  Result Value Ref Range   Sodium 140 135 - 145 mmol/L   Potassium 4.2 3.5 - 5.1 mmol/L   Chloride 102 98 - 111 mmol/L   CO2 31 22 - 32 mmol/L   Glucose, Bld 86 70 - 99 mg/dL    Comment: Glucose reference range applies only to samples taken after fasting for at least 8 hours.   BUN 33 (H) 8 - 23 mg/dL   Creatinine, Ser 1.24 0.61 - 1.24 mg/dL   Calcium 9.0 8.9 - 10.3 mg/dL   GFR, Estimated 53 (L) >60 mL/min    Comment: (NOTE) Calculated using the CKD-EPI Creatinine Equation (2021)    Anion gap 7 5 - 15    Comment: Performed at [facility], [address], [place], [place] [address]  CBC     Status: Abnormal   Collection Time: 08/17/20  3:47 AM  Result Value Ref Range   WBC 4.4 4.0 - 10.5 K/uL   RBC 4.32 4.22 - 5.81 MIL/uL   Hemoglobin 11.5 (L) 13.0 - 17.0 g/dL   HCT 37.6 (L) 39.0 - 52.0 %   MCV 87.0 80.0 - 100.0 fL   MCH 26.6 26.0 - 34.0 pg   MCHC 30.6 30.0 - 36.0 g/dL   RDW 17.2 (H) 11.5 - 15.5 %   Platelets 146 (L) 150 - 400 K/uL   nRBC 0.0 0.0 - 0.2 %    Comment: Performed at [facility], [address], [place], [place] [address]  ECHOCARDIOGRAM COMPLETE     Status: None   Collection Time: 08/17/20 11:48 AM  Result Value Ref Range   Weight 2,430.35 oz   Height 66 in   BP 145/85 mmHg   Single Plane A2C EF 23.6 %   Single Plane A4C EF 27.9 %   Calc EF 26.8 %   Area-P 1/2 4.60 cm2   S' Lateral 4.70 cm  Basic metabolic panel  Status: Abnormal   Collection Time: 08/18/20  8:26 AM  Result Value Ref Range    Sodium 141 135 - 145 mmol/L   Potassium 4.5 3.5 - 5.1 mmol/L   Chloride 107 98 - 111 mmol/L   CO2 29 22 - 32 mmol/L   Glucose, Bld 86 70 - 99 mg/dL    Comment: Glucose reference range applies only to samples taken after fasting for at least 8 hours.   BUN 28 (H) 8 - 23 mg/dL   Creatinine, Ser 1.26 (H) 0.61 - 1.24 mg/dL   Calcium 9.2 8.9 - 10.3 mg/dL   GFR, Estimated 52 (L) >60 mL/min    Comment: (NOTE) Calculated using the CKD-EPI Creatinine Equation (2021)    Anion gap 5 5 - 15    Comment: Performed at [facility], [address] [place], [place] [address]  Magnesium     Status: None   Collection Time: 08/18/20  8:26 AM  Result Value Ref Range   Magnesium 1.9 1.7 - 2.4 mg/dL    Comment: Performed at [facility], [address] [place], [place] [address]  CBC     Status: Abnormal   Collection Time: 08/18/20  8:26 AM  Result Value Ref Range   WBC 3.2 (L) 4.0 - 10.5 K/uL   RBC 4.26 4.22 - 5.81 MIL/uL   Hemoglobin 11.4 (L) 13.0 - 17.0 g/dL   HCT 37.7 (L) 39.0 - 52.0 %   MCV 88.5 80.0 - 100.0 fL   MCH 26.8 26.0 - 34.0 pg   MCHC 30.2 30.0 - 36.0 g/dL   RDW 17.3 (H) 11.5 - 15.5 %   Platelets 132 (L) 150 - 400 K/uL   nRBC 0.0 0.0 - 0.2 %    Comment: Performed at [facility], [address] [place], [place] [address]  Protime-INR     Status: Abnormal   Collection Time: 08/18/20  8:26 AM  Result Value Ref Range   Prothrombin Time 17.6 (H) 11.4 - 15.2 seconds   INR 1.5 (H) 0.8 - 1.2    Comment: (NOTE) INR goal varies based on device and disease states. Performed at [facility], [address], [place], [place] [address]   Basic metabolic panel     Status: Abnormal   Collection Time: 08/19/20  5:28 AM  Result Value Ref Range   Sodium 143 135 - 145 mmol/L   Potassium 4.4 3.5 - 5.1 mmol/L   Chloride 108 98 - 111 mmol/L   CO2 29 22 - 32 mmol/L   Glucose, Bld 101 (H) 70 - 99 mg/dL    Comment: Glucose reference range applies only to samples taken after fasting for at least 8  hours.   BUN 31 (H) 8 - 23 mg/dL   Creatinine, Ser 1.27 (H) 0.61 - 1.24 mg/dL   Calcium 9.4 8.9 - 10.3 mg/dL   GFR, Estimated 52 (L) >60 mL/min    Comment: (NOTE) Calculated using the CKD-EPI Creatinine Equation (2021)    Anion gap 6 5 - 15    Comment: Performed at [facility], [address], [place], [place] [address]  Magnesium     Status: None   Collection Time: 08/19/20  5:28 AM  Result Value Ref Range   Magnesium 1.8 1.7 - 2.4 mg/dL    Comment: Performed at [facility], [address], [place], [place] [address]  CBC with Differential/Platelet     Status: Abnormal   Collection Time: 08/29/20 10:25 AM  Result Value Ref Range  WBC 3.5 3.4 - 10.8 x10E3/uL   RBC 4.39 4.14 - 5.80 x10E6/uL   Hemoglobin 11.7 (L) 13.0 - 17.7 g/dL   Hematocrit 37.4 (L) 37.5 - 51.0 %   MCV 85 79 - 97 fL   MCH 26.7 26.6 - 33.0 pg   MCHC 31.3 (L) 31.5 - 35.7 g/dL   RDW 15.5 (H) 11.6 - 15.4 %   Platelets 159 150 - 450 x10E3/uL   Neutrophils 55 Not Estab. %   Lymphs 29 Not Estab. %   Monocytes 8 Not Estab. %   Eos 7 Not Estab. %   Basos 1 Not Estab. %   Neutrophils Absolute 2.0 1.4 - 7.0 x10E3/uL   Lymphocytes Absolute 1.0 0.7 - 3.1 x10E3/uL   Monocytes Absolute 0.3 0.1 - 0.9 x10E3/uL   EOS (ABSOLUTE) 0.2 0.0 - 0.4 x10E3/uL   Basophils Absolute 0.0 0.0 - 0.2 x10E3/uL   Immature Granulocytes 0 Not Estab. %   Immature Grans (Abs) 0.0 0.0 - 0.1 x10E3/uL  CMP14+EGFR     Status: Abnormal   Collection Time: 08/29/20 10:25 AM  Result Value Ref Range   Glucose 111 (H) 65 - 99 mg/dL   BUN 24 10 - 36 mg/dL   Creatinine, Ser 1.27 0.76 - 1.27 mg/dL   eGFR 52 (L) >59 mL/min/1.73   BUN/Creatinine Ratio 19 10 - 24   Sodium 146 (H) 134 - 144 mmol/L   Potassium 4.1 3.5 - 5.2 mmol/L   Chloride 101 96 - 106 mmol/L   CO2 32 (H) 20 - 29 mmol/L   Calcium 9.6 8.6 - 10.2 mg/dL   Total Protein 6.5 6.0 - 8.5 g/dL   Albumin 3.9 3.5 - 4.6 g/dL   Globulin, Total 2.6 1.5 - 4.5 g/dL   Albumin/Globulin Ratio 1.5 1.2  - 2.2   Bilirubin Total 0.3 0.0 - 1.2 mg/dL   Alkaline Phosphatase 143 (H) 44 - 121 IU/L   AST 12 0 - 40 IU/L   ALT 10 0 - 44 IU/L  Urinalysis, Routine w reflex microscopic     Status: Abnormal   Collection Time: 09/04/20 12:43 PM  Result Value Ref Range   Specific Gravity, UA 1.020 1.005 - 1.030   pH, UA 6.0 5.0 - 7.5   Color, UA Red (A) Yellow   Appearance Ur Turbid (A) Clear   Leukocytes,UA Negative Negative   Protein,UA 3+ (A) Negative/Trace   Glucose, UA Negative Negative   Ketones, UA Negative Negative   RBC, UA 3+ (A) Negative   Bilirubin, UA Negative Negative   Urobilinogen, Ur 1.0 0.2 - 1.0 mg/dL   Nitrite, UA Negative Negative   Microscopic Examination See below:   Microscopic Examination     Status: Abnormal   Collection Time: 09/04/20 12:43 PM   Urine  Result Value Ref Range   WBC, UA None seen 0 - 5 /hpf   RBC >30 (A) 0 - 2 /hpf   Epithelial Cells (non renal) 0-10 0 - 10 /hpf   Renal Epithel, UA None seen None seen /hpf   Mucus, UA Present Not Estab.   Bacteria, UA Few (A) None seen/Few  Comprehensive metabolic panel     Status: Abnormal   Collection Time: 09/29/20  5:02 PM  Result Value Ref Range   Sodium 142 135 - 145 mmol/L   Potassium 3.8 3.5 - 5.1 mmol/L   Chloride 104 98 - 111 mmol/L   CO2 32 22 - 32 mmol/L   Glucose, Bld 108 (H) 70 -  99 mg/dL    Comment: Glucose reference range applies only to samples taken after fasting for at least 8 hours.   BUN 28 (H) 8 - 23 mg/dL   Creatinine, Ser 1.13 0.61 - 1.24 mg/dL   Calcium 9.2 8.9 - 10.3 mg/dL   Total Protein 6.5 6.5 - 8.1 g/dL   Albumin 3.6 3.5 - 5.0 g/dL   AST 16 15 - 41 U/L   ALT 10 0 - 44 U/L   Alkaline Phosphatase 141 (H) 38 - 126 U/L   Total Bilirubin 0.8 0.3 - 1.2 mg/dL   GFR, Estimated 59 (L) >60 mL/min    Comment: (NOTE) Calculated using the CKD-EPI Creatinine Equation (2021)    Anion gap 6 5 - 15    Comment: Performed at [facility], [address] [place], [place] [address]  CBC with  Differential     Status: Abnormal   Collection Time: 09/29/20  5:02 PM  Result Value Ref Range   WBC 3.4 (L) 4.0 - 10.5 K/uL   RBC 4.10 (L) 4.22 - 5.81 MIL/uL   Hemoglobin 11.1 (L) 13.0 - 17.0 g/dL   HCT 36.6 (L) 39.0 - 52.0 %   MCV 89.3 80.0 - 100.0 fL   MCH 27.1 26.0 - 34.0 pg   MCHC 30.3 30.0 - 36.0 g/dL   RDW 18.4 (H) 11.5 - 15.5 %   Platelets 134 (L) 150 - 400 K/uL   nRBC 0.0 0.0 - 0.2 %   Neutrophils Relative % 58 %   Neutro Abs 2.0 1.7 - 7.7 K/uL   Lymphocytes Relative 27 %   Lymphs Abs 0.9 0.7 - 4.0 K/uL   Monocytes Relative 10 %   Monocytes Absolute 0.3 0.1 - 1.0 K/uL   Eosinophils Relative 4 %   Eosinophils Absolute 0.1 0.0 - 0.5 K/uL   Basophils Relative 1 %   Basophils Absolute 0.0 0.0 - 0.1 K/uL   Immature Granulocytes 0 %   Abs Immature Granulocytes 0.00 0.00 - 0.07 K/uL    Comment: Performed at [facility], [address], [place], [place] [address]  Urinalysis, Routine w reflex microscopic Urine, Clean Catch     Status: Abnormal   Collection Time: 09/29/20  5:02 PM  Result Value Ref Range   Color, Urine YELLOW YELLOW   APPearance HAZY (A) CLEAR   Specific Gravity, Urine 1.012 1.005 - 1.030   pH 6.0 5.0 - 8.0   Glucose, UA NEGATIVE NEGATIVE mg/dL   Hgb urine dipstick LARGE (A) NEGATIVE   Bilirubin Urine NEGATIVE NEGATIVE   Ketones, ur NEGATIVE NEGATIVE mg/dL   Protein, ur 100 (A) NEGATIVE mg/dL   Nitrite NEGATIVE NEGATIVE   Leukocytes,Ua NEGATIVE NEGATIVE   RBC / HPF >50 (H) 0 - 5 RBC/hpf   WBC, UA 6-10 0 - 5 WBC/hpf   Bacteria, UA RARE (A) NONE SEEN   Squamous Epithelial / LPF 0-5 0 - 5   Mucus PRESENT     Comment: Performed at [facility], [address] [place], [place] [address]  Urine culture     Status: None   Collection Time: 09/29/20  5:02 PM   Specimen: Urine, Random  Result Value Ref Range   Specimen Description      URINE, RANDOM Performed at [facility], [address], [place], [place] [address]    Special Requests       NONE Performed at [facility], [address], [place], [place] [address]    Culture  NO GROWTH Performed at [facility], [place] [address], [place], [place] [address]    Report Status 10/01/2020 FINAL     BMET  PSA  No results found for: TESTOSTERONE  No results found. However, due to the size of the patient record, not all encounters were searched. Please check Results Review for a complete set of results.     Studies/Results:  ER records reviewed.   Procedure: foley catheter was removed.   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  He is a poor surgical candidate but his condition has improved since his last visit with me in late May and if he develops more profound, symptomatic bleeding he may require intervention, but at this time I will have him hold the Eliquis and I will remove the foley.   He will return in a week or so to reassess.   I will reach out to his PCP and cardiologist about the relative risks of leaving him off of the Eliquis vs the high risk of bleeding on it.   I did explain to the son that leaving him off of it would increase his risk of sudden life threatening cardiovascular event.  His son is in agreement with the later recommendation and he will just see me prn.    No orders of the defined types were placed in this encounter.    No orders of the defined types were placed in this encounter.     Return in about 2 weeks (around 10/16/2020).   CC: Dettinger, Fransisca Kaufmann, MD      [name] 10/02/2020 Patient ID: [place] [name], male   DOB: [birth year], 87 y.o.   MRN: [id number]

## 2020-10-06 NOTE — ED Notes (Signed)
Pericare performed prior to foley insertion per policy.

## 2020-10-10 DIAGNOSIS — Z20822 Contact with and (suspected) exposure to covid-19: Secondary | ICD-10-CM | POA: Diagnosis not present

## 2020-10-16 ENCOUNTER — Ambulatory Visit: Payer: BC Managed Care – PPO | Admitting: Urology

## 2020-10-23 ENCOUNTER — Other Ambulatory Visit: Payer: Self-pay | Admitting: Family Medicine

## 2020-10-28 DIAGNOSIS — R0902 Hypoxemia: Secondary | ICD-10-CM | POA: Diagnosis not present

## 2020-10-28 DIAGNOSIS — R531 Weakness: Secondary | ICD-10-CM | POA: Diagnosis not present

## 2020-11-13 ENCOUNTER — Encounter: Payer: Self-pay | Admitting: Urology

## 2020-11-13 ENCOUNTER — Ambulatory Visit (INDEPENDENT_AMBULATORY_CARE_PROVIDER_SITE_OTHER): Payer: Medicare Other | Admitting: Urology

## 2020-11-13 ENCOUNTER — Other Ambulatory Visit: Payer: Self-pay

## 2020-11-13 VITALS — BP 114/75 | HR 87

## 2020-11-13 DIAGNOSIS — C680 Malignant neoplasm of urethra: Secondary | ICD-10-CM | POA: Diagnosis not present

## 2020-11-13 DIAGNOSIS — N3941 Urge incontinence: Secondary | ICD-10-CM

## 2020-11-13 DIAGNOSIS — I70238 Atherosclerosis of native arteries of right leg with ulceration of other part of lower right leg: Secondary | ICD-10-CM

## 2020-11-13 DIAGNOSIS — I70248 Atherosclerosis of native arteries of left leg with ulceration of other part of lower left leg: Secondary | ICD-10-CM | POA: Diagnosis not present

## 2020-11-13 DIAGNOSIS — R310 Gross hematuria: Secondary | ICD-10-CM | POA: Diagnosis not present

## 2020-11-13 DIAGNOSIS — C678 Malignant neoplasm of overlapping sites of bladder: Secondary | ICD-10-CM

## 2020-11-13 LAB — URINALYSIS, MICROSCOPIC ONLY
Epithelial Cells (non renal): NONE SEEN /hpf (ref 0–10)
RBC, Urine: >30 /hpf — AB (ref 0–2)
Renal Epithel, UA: NONE SEEN /hpf
WBC, UA: NONE SEEN /hpf (ref 0–5)

## 2020-11-13 NOTE — Progress Notes (Signed)
Subjective:  1. Gross hematuria   2. Malignant neoplasm of overlapping sites of bladder (Scott Scott [name])   3. Urethral cancer (Scott [name])   4. Urge incontinence     11/13/20: Scott Scott [name] returns in f/u for the history noted below.   He has had some intermittent hematuria. He is off of the Eliquis.  He is not having difficulty voiding but has some urgency with incontinence.   His UA Scott [name] has >30 RBC's and is grossly bloody.  He has been started on tamsulosin on 10/23/20 by Dr. Warrick Scott [name].   He was also on Myrbetriq.  He is having some headaches but no other pains.  He is sleeping more.    10/02/20: Scott Scott [name] returns Scott [name] in f/u.  He was seen last month and was found to have recurrent tumors in the prostate and bladder.  He had bleeding over the weekend and went to the ER and is back with a foley with deep pink urine with some clots intermittently.  The catheter has been draining and it is a 83f.  He remains on Eliquis.  A culture on 6/20 was negative.  His Hgb was 11.1 which is slight decline from 11.7 on 08/29/20.   He has no pain and is tolerating the foley.   GU hx: Scott Scott [name] in f/u for his history of urothelial cancer.  He last had a TURBT of recurrent disease on 03/04/20 and was found to have multfocal HG NMIBC of the urethra, prostatic urethra and multiple bladder sites.  Retrograde pyelography was normal.  He had a resection in 7/21 and he had only low grade disease at this time.   He is doing well without significant complaints and his urine Scott [name] ahs 6-10 WBC and 3-10 RBC's.    He has urgency and can have UUI.   He remains on Myrbetriq.  He has a history of UTI's but is not on treatment now.    He has no pain.        ROS:  ROS:  A complete review of systems was performed.  All systems are negative except for pertinent findings as noted.   ROS  Allergies  Allergen Reactions   Atorvastatin Hives and Rash   Rocephin [Ceftriaxone Sodium In Dextrose] Other (See Comments)    Recurrent seizures     Outpatient Encounter Medications as of 11/13/2020  Medication Sig   tamsulosin (FLOMAX) 0.4 MG CAPS capsule TAKE 1 CAPSULE DAILY   acetaminophen (TYLENOL) 500 MG tablet Take 1,000 mg by mouth every 6 (six) hours as needed for moderate pain or headache.   albuterol (PROVENTIL) (2.5 MG/3ML) 0.083% nebulizer solution Take 3 mLs (2.5 mg total) by nebulization every 6 (six) hours as needed for wheezing or shortness of breath.   albuterol (VENTOLIN HFA) 108 (90 Base) MCG/ACT inhaler Inhale 2 puffs into the lungs every 6 (six) hours as needed for wheezing or shortness of breath.   aspirin EC 81 MG EC tablet Take 1 tablet (81 mg total) by mouth daily. Swallow whole.   azithromycin (ZITHROMAX) 250 MG tablet Take 2 tablets PO on day one, and one tablet PO daily thereafter until completed. (Patient not taking: Reported on 10/02/2020)   benzonatate (TESSALON) 100 MG capsule Take 1 capsule (100 mg total) by mouth 3 (three) times daily as needed. (Patient not taking: Reported on 10/02/2020)   carvedilol (COREG) 6.25 MG tablet Take 1 tablet (6.25 mg total) by mouth 2 (two) times daily with a meal.   donepezil (ARICEPT) 10 MG tablet  Take 1 tablet (10 mg total) by mouth at bedtime.   febuxostat (ULORIC) 40 MG tablet Take 1 tablet (40 mg total) by mouth daily.   fluticasone (FLONASE) 50 MCG/ACT nasal spray Place 1 spray into both nostrils 2 (two) times daily as needed for allergies or rhinitis. SPRAY 1 SPRAY IN EACH NOSTRIL ONCE DAILY.   furosemide (LASIX) 20 MG tablet TAKE 1 TABLET DAILY AS NEEDED (signs of volume overload).   gabapentin (NEURONTIN) 400 MG capsule Take 1 capsule (400 mg total) by mouth 2 (two) times daily.   Ipratropium-Albuterol (COMBIVENT RESPIMAT) 20-100 MCG/ACT AERS respimat Inhale 1 puff into the lungs every 6 (six) hours as needed for wheezing or shortness of breath (As needed for wheezing , cough or shortness of breath).   levETIRAcetam (KEPPRA) 250 MG tablet Take 1 tablet (250 mg total) by  mouth 2 (two) times daily.   MYRBETRIQ 25 MG TB24 tablet TAKE 1 TABLET DAILY AS DIRECTED (Patient taking differently: Take 25 mg by mouth daily.)   pantoprazole (PROTONIX) 40 MG tablet Take 1 tablet (40 mg total) by mouth daily.   sertraline (ZOLOFT) 50 MG tablet Take 1 tablet (50 mg total) by mouth daily.   simvastatin (ZOCOR) 10 MG tablet Take 1 tablet (10 mg total) by mouth daily at 6 PM.   [DISCONTINUED] apixaban (ELIQUIS) 2.5 MG TABS tablet Take 1 tablet (2.5 mg total) by mouth 2 (two) times daily.   No facility-administered encounter medications on file as of 11/13/2020.    Past Medical History:  Diagnosis Date   Arthritis    "all over"   Atrial fibrillation (Scott Scott [name])    Benign localized prostatic hyperplasia with lower urinary tract symptoms (LUTS)    Bradycardia 11/28/2015   Severe-HR in the 20s to 30s following intubation for urological procedure.   CAD- non obstructive disease by cath 3/14 cardiologist-  dr Scott Scott [name]   Chronic lower back pain    COPD (chronic obstructive pulmonary disease) (Scott [name])    DDD (degenerative disc disease)    Depression    Dyspnea    w/ exertion and lying down (raise head)   ETOH abuse    GERD (gastroesophageal reflux disease)    Glaucoma    Glaucoma, both eyes    History of bladder cancer urologist-- dr Scott Scott [name]   first dx 2012--  s/p TURBT's   History of DVT of lower extremity 03/2013   bilateral    History of pulmonary embolus (PE) 04/2013   HTN (hypertension)    Hyperlipidemia    Hypertension    LBBB (left bundle branch block)    chronic   Non-ischemic cardiomyopathy (Scott Scott [name])    last echo 01-18-2017, ef 35-40%   NSVT (nonsustained ventricular tachycardia) (Scott Scott [name])    a. NSVT 06/2012; NSVT also seen during 03/2013 adm. b. Med rx. Not candidate for ICD given adv age.   PAD (peripheral artery disease) (HCC)    Popliteal artery aneurysm, bilateral (HCC)    DOCUMENTED CHRONIC PARTIAL OCCLUSION--  PT DENIES CLAUDICATION OR ANY OTHER SYMPTOMS   PVCs  (premature ventricular contractions)    PVD (peripheral vascular disease) (HCC)    Right ABI .75, Left .78 (2006)   Syncope 06/2012   a. Felt to be postural syncope related to diuretics 06/2012.   Systolic and diastolic CHF, chronic (HCC) cardiologsit-  dr Scott Scott [name]   a. NICM - patent cors 06/2012, EF 40% at that time. b. 03/2013 eval: EF 20-25%.   Vision loss, left eye    due to glaucoma  Wears glasses     Past Surgical History:  Procedure Laterality Date   AMPUTATION Left 10/16/2014   Procedure: AMPUTATION ABOVE KNEE- LEFT;  Surgeon: Mal Misty, MD;  Location: [facility];  Service: Vascular;  Laterality: Left;   CARDIAC CATHETERIZATION  05-11-2004  DR [place] PLAQUE/ NORMAL LVF/ EF 55%/  NON-OBSTRUCTIVE LAD 25%   CARDIAC CATHETERIZATION  06/18/2012   dr Scott Scott [name]   mild luminal irregularities of coronaries, ef 45-50%   CARDIOVASCULAR STRESS TEST  10-15-2010   LOW RISK NUCLEAR STUDY/ NO EVIDENCE OF ISCHEMIA/ NORMAL EF   CATARACT EXTRACTION W/ INTRAOCULAR LENS  IMPLANT, BILATERAL Bilateral    CYSTOSCOPY  10/07/2011   Procedure: CYSTOSCOPY;  Surgeon: Malka So, MD;  Location: [facility];  Service: Urology;  Laterality: N/A;   CYSTOSCOPY N/A 10/20/2018   Procedure: CYSTOSCOPY WITH FULGURATION OF PROSTATIC URETHRAL TUMOR;  Surgeon: [name], MD;  Location[facility];  Service: Urology;  Laterality: N/A;   CYSTOSCOPY N/A 10/11/2019   Procedure: CYSTOSCOPY;  Surgeon: Cleon Gustin, MD;  Location: [facility];  Service: Urology;  Laterality: N/A;   CYSTOSCOPY W/ RETROGRADES Bilateral 03/04/2020   Procedure: CYSTOSCOPY WITH BILATERAL RETROGRADE;  Surgeon: [name], MD;  Location[facility];  Service: Urology;  Laterality: Bilateral;   CYSTOSCOPY WITH BIOPSY  03/14/2012   Procedure: CYSTOSCOPY WITH BIOPSY;  Surgeon: Malka So, MD;  [facility];  Service: Urology;  Laterality: N/A;  WITH FULGURATION   CYSTOSCOPY WITH BIOPSY N/A 12/09/2016   Procedure:  CYSTOSCOPY WITH BIOPSY AND FULGURATION;  Surgeon: [name], MD;  [facility];  Service: Urology;  Laterality: N/A;   CYSTOSCOPY WITH BIOPSY N/A 12/20/2017   Procedure: CYSTOSCOPY WITH BIOPSY WITH FULGURATION;  Surgeon: [name], MD;  [facility];  Service: Urology;  Laterality: N/A;   CYSTOSCOPY WITH BIOPSY N/A 06/09/2018   Procedure: CYSTOSCOPY WITH BIOPSY AND FULGURATION;  Surgeon: [name], MD;  Location[facility];  Service: Urology;  Laterality: N/A;   CYSTOSCOPY WITH FULGERATION N/A 01/20/2016   Procedure: CYSTOSCOPY, BIOPSY WITH FULGERATION OF URTHRAL TUMOR;  Surgeon: [name], MD;  [facility];  Service: Urology;  Laterality: N/A;   CYSTOSCOPY WITH FULGERATION N/A 06/01/2016   Procedure: CYSTOSCOPY WITH FULGERATION urethral tumors and bladder neck;  Surgeon: [name], MD;  [facility];  Service: Urology;  Laterality: N/A;   SHOULDER SURGERY Left 1970's   TONSILLECTOMY     TRANSTHORACIC ECHOCARDIOGRAM  01-18-2017   dr Scott Scott [name]   moderate LVH, ef 35-40%, diffuse hypokinesis/  trivial AR and MR/  mild TR   TRANSURETHRAL RESECTION OF BLADDER TUMOR  10/07/2011   Procedure: TRANSURETHRAL RESECTION OF BLADDER TUMOR (TURBT);  Surgeon: Malka So, MD;  Location: [facility];  Service: Urology;  Laterality: N/A;   TRANSURETHRAL RESECTION OF BLADDER TUMOR N/A 07/10/2015   Procedure: TRANSURETHRAL RESECTION OF BLADDER TUMOR (TURBT), CYSTOSCOPY ;  Surgeon: [name], MD;  [facility];  Service: Urology;  Laterality: N/A;   TRANSURETHRAL RESECTION OF BLADDER TUMOR N/A 10/11/2019   Procedure: TRANSURETHRAL RESECTION OF PROSTATIC URETHRAL TUMOR;  Surgeon: Cleon Gustin, MD;  [facility];  Service: Urology;  Laterality: N/A;   TRANSURETHRAL RESECTION OF BLADDER TUMOR WITH MITOMYCIN-C N/A 03/04/2020   Procedure: TRANSURETHRAL RESECTION OF BLADDER TUMOR WITH GEMCITABINE;  Surgeon: [name], MD;  [facility];  Service: Urology;  Laterality:  N/A;   TRANSURETHRAL RESECTION OF PROSTATE  03/14/2012  Procedure: TRANSURETHRAL RESECTION OF THE PROSTATE WITH GYRUS INSTRUMENTS;  Surgeon: Malka So, MD;  [facility];  Service: Urology;  Laterality: N/A;    Social History   Socioeconomic History   Marital status: Widowed    Spouse name: Not on file   Number of children: 1   Years of education: Not on file   Highest education level: Not on file  Occupational History   Occupation: Retired  Tobacco Use   Smoking status: Former    Packs/day: 1.00    Years: 35.00    Pack years: 35.00    Types: Cigarettes    Start date: 04/12/1942    Quit date: 04/13/1975    Years since quitting: 45.6   Smokeless tobacco: Never  Vaping Use   Vaping Use: Never used  Substance and Sexual Activity   Alcohol use: Not Currently    Alcohol/week: 7.0 standard drinks    Types: 7 Cans of beer per week   Drug use: No   Sexual activity: Never  Other Topics Concern   Not on file  Social History Narrative   Lives in [place] with spouse who is s/p spinal cord injury.  He has one grown son who lives in [place].  He is retired but worked for [employer] for years and lives in [place].   Social Determinants of Health   Financial Resource Strain: Not on file  Food Insecurity: Not on file  Transportation Needs: Not on file  Physical Activity: Not on file  Stress: Not on file  Social Connections: Not on file  Intimate Partner Violence: Not on file    Family History  Problem Relation Age of Onset   Hypertension Mother    Arthritis Mother    Lung cancer Sister    Deep vein thrombosis Father    Early death Brother        Objective: Vitals:   11/13/20 1022  BP: 114/75  Pulse: 87     Physical Exam  Lab Results:    Recent Results (from the past 2160 hour(s))  Brain natriuretic peptide     Status: Abnormal   Collection Time: 08/16/20  6:45 PM  Result Value Ref Range   B Natriuretic Peptide 1,856.0 (H) 0.0 - 100.0 pg/mL     Comment: Performed at [facility], [address], [place], [place] [address]  Comprehensive metabolic panel     Status: Abnormal   Collection Time: 08/16/20  6:47 PM  Result Value Ref Range   Sodium 141 135 - 145 mmol/L   Potassium 4.1 3.5 - 5.1 mmol/L   Chloride 105 98 - 111 mmol/L   CO2 29 22 - 32 mmol/L   Glucose, Bld 137 (H) 70 - 99 mg/dL    Comment: Glucose reference range applies only to samples taken after fasting for at least 8 hours.   BUN 38 (H) 8 - 23 mg/dL   Creatinine, Ser 1.66 (H) 0.61 - 1.24 mg/dL   Calcium 9.4 8.9 - 10.3 mg/dL   Total Protein 7.3 6.5 - 8.1 g/dL   Albumin 3.8 3.5 - 5.0 g/dL   AST 21 15 - 41 U/L   ALT 13 0 - 44 U/L   Alkaline Phosphatase 138 (H) 38 - 126 U/L   Total Bilirubin 0.3 0.3 - 1.2 mg/dL   GFR, Estimated 37 (L) >60 mL/min    Comment: (NOTE) Calculated using the CKD-EPI Creatinine Equation (2021)    Anion gap 7 5 - 15  Comment: Performed at [facility], [address] [place], [place] [address]  CBC WITH DIFFERENTIAL     Status: Abnormal   Collection Time: 08/16/20  6:47 PM  Result Value Ref Range   WBC 4.0 4.0 - 10.5 K/uL   RBC 4.66 4.22 - 5.81 MIL/uL   Hemoglobin 12.4 (L) 13.0 - 17.0 g/dL   HCT 41.1 39.0 - 52.0 %   MCV 88.2 80.0 - 100.0 fL   MCH 26.6 26.0 - 34.0 pg   MCHC 30.2 30.0 - 36.0 g/dL   RDW 17.4 (H) 11.5 - 15.5 %   Platelets 165 150 - 400 K/uL   nRBC 0.0 0.0 - 0.2 %   Neutrophils Relative % 65 %   Neutro Abs 2.6 1.7 - 7.7 K/uL   Lymphocytes Relative 26 %   Lymphs Abs 1.0 0.7 - 4.0 K/uL   Monocytes Relative 5 %   Monocytes Absolute 0.2 0.1 - 1.0 K/uL   Eosinophils Relative 3 %   Eosinophils Absolute 0.1 0.0 - 0.5 K/uL   Basophils Relative 1 %   Basophils Absolute 0.0 0.0 - 0.1 K/uL   Immature Granulocytes 0 %   Abs Immature Granulocytes 0.01 0.00 - 0.07 K/uL    Comment: Performed at [facility], [address] [place], [place] [address]  Protime-INR     Status: Abnormal   Collection Time: 08/16/20  6:47 PM   Result Value Ref Range   Prothrombin Time 15.8 (H) 11.4 - 15.2 seconds   INR 1.3 (H) 0.8 - 1.2    Comment: (NOTE) INR goal varies based on device and disease states. Performed at [facility], [address], [place], [place] [address]   APTT     Status: None   Collection Time: 08/16/20  6:47 PM  Result Value Ref Range   aPTT 30 24 - 36 seconds    Comment: Performed at [facility], [address], [place], [name] [address]  Blood gas, arterial     Status: Abnormal   Collection Time: 08/16/20  6:50 PM  Result Value Ref Range   FIO2 28.00    pH, Arterial 7.353 7.350 - 7.450   pCO2 arterial 50.7 (H) 32.0 - 48.0 mmHg   pO2, Arterial 73.7 (L) 83.0 - 108.0 mmHg   Bicarbonate 25.8 20.0 - 28.0 mmol/L   Acid-Base Excess 2.4 (H) 0.0 - 2.0 mmol/L   O2 Saturation 93.1 %   Patient temperature 37.0    Allens test (pass/fail) PASS PASS    Comment: Performed at [facility], [address], [place], [place] [address]  CBG monitoring, ED     Status: Abnormal   Collection Time: 08/16/20  6:54 PM  Result Value Ref Range   Glucose-Capillary 120 (H) 70 - 99 mg/dL    Comment: Glucose reference range applies only to samples taken after fasting for at least 8 hours.  Lactic acid, plasma     Status: Abnormal   Collection Time: 08/16/20  7:01 PM  Result Value Ref Range   Lactic Acid, Venous 2.4 (HH) 0.5 - 1.9 mmol/L    Comment: CRITICAL RESULT CALLED TO, READ BACK BY AND VERIFIED WITH: PRUITT,G 1939 08/16/2020 COLEMAN,R Performed at [facility], [address] [place], [place] [address]   Blood Culture (routine x 2)     Status: None   Collection Time: 08/16/20  7:14 PM   Specimen: BLOOD RIGHT FOREARM  Result Value Ref Range   Specimen Description BLOOD RIGHT FOREARM    Special Requests  Blood Culture results may not be optimal due to an inadequate volume of blood received in culture bottles BOTTLES DRAWN AEROBIC AND ANAEROBIC   Culture      NO GROWTH 5 DAYS Performed at [facility], [address], [place], [name] [address]    Report Status 08/21/2020 FINAL   Blood Culture (routine x 2)     Status: None   Collection Time: 08/16/20  7:14 PM   Specimen: BLOOD RIGHT HAND  Result Value Ref Range   Specimen Description BLOOD RIGHT HAND    Special Requests      Blood Culture results may not be optimal due to an inadequate volume of blood received in culture bottles BOTTLES DRAWN AEROBIC AND ANAEROBIC   Culture      NO GROWTH 5 DAYS Performed at [facility], [address], [place], [place] [address]    Report Status 08/21/2020 FINAL   Resp Panel by RT-PCR (Flu A&B, Covid) Nasopharyngeal Swab     Status: None   Collection Time: 08/16/20  8:20 PM   Specimen: Nasopharyngeal Swab; Nasopharyngeal(NP) swabs in vial transport medium  Result Value Ref Range   SARS Coronavirus 2 by RT PCR NEGATIVE NEGATIVE    Comment: (NOTE) SARS-CoV-2 target nucleic acids are NOT DETECTED.  The SARS-CoV-2 RNA is generally detectable in upper respiratory specimens during the acute phase of infection. The lowest concentration of SARS-CoV-2 viral copies this assay can detect is 138 copies/mL. A negative result does not preclude SARS-Cov-2 infection and should not be used as the sole basis for treatment or other patient management decisions. A negative result may occur with  improper specimen collection/handling, submission of specimen other than nasopharyngeal swab, presence of viral mutation(s) within the areas targeted by this assay, and inadequate number of viral copies(<138 copies/mL). A negative result must be combined with clinical observations, patient history, and epidemiological information. The expected result is Negative.  Fact Sheet for Patients:  EntrepreneurPulse.com.au  Fact Sheet for Healthcare Providers:  IncredibleEmployment.be  This test is no t yet approved or cleared by the [place] FDA and  has been authorized for  detection and/or diagnosis of SARS-CoV-2 by FDA under an Emergency Use Authorization (EUA). This EUA will remain  in effect (meaning this test can be used) for the duration of the COVID-19 declaration under Section 564(b)(1) of the Act, 21 U.S.C.section 360bbb-3(b)(1), unless the authorization is terminated  or revoked sooner.       Influenza A by PCR NEGATIVE NEGATIVE   Influenza B by PCR NEGATIVE NEGATIVE    Comment: (NOTE) The Xpert Xpress SARS-CoV-2/FLU/RSV plus assay is intended as an aid in the diagnosis of influenza from Nasopharyngeal swab specimens and should not be used as a sole basis for treatment. Nasal washings and aspirates are unacceptable for Xpert Xpress SARS-CoV-2/FLU/RSV testing.  Fact Sheet for Patients: EntrepreneurPulse.com.au  Fact Sheet for Healthcare Providers: IncredibleEmployment.be  This test is not yet approved or cleared by the [place] FDA and has been authorized for detection and/or diagnosis of SARS-CoV-2 by FDA under an Emergency Use Authorization (EUA). This EUA will remain in effect (meaning this test can be used) for the duration of the COVID-19 declaration under Section 564(b)(1) of the Act, 21 U.S.C. section 360bbb-3(b)(1), unless the authorization is terminated or revoked.  Performed at [facility], [address] [place], [place] [address]   Lactic acid, plasma     Status: None   Collection Time: 08/16/20  8:38 PM  Result Value Ref Range  Lactic Acid, Venous 1.7 0.5 - 1.9 mmol/L    Comment: Performed at [facility], [address] [place], [place] [address]  Procalcitonin - Baseline     Status: None   Collection Time: 08/16/20  8:38 PM  Result Value Ref Range   Procalcitonin <0.10 ng/mL    Comment:        Interpretation: PCT (Procalcitonin) <= 0.5 ng/mL: Systemic infection (sepsis) is not likely. Local bacterial infection is possible. (NOTE)       Sepsis PCT Algorithm           Lower  Respiratory Tract                                      Infection PCT Algorithm    ----------------------------     ----------------------------         PCT < 0.25 ng/mL                PCT < 0.10 ng/mL          Strongly encourage             Strongly discourage   discontinuation of antibiotics    initiation of antibiotics    ----------------------------     -----------------------------       PCT 0.25 - 0.50 ng/mL            PCT 0.10 - 0.25 ng/mL               OR       >80% decrease in PCT            Discourage initiation of                                            antibiotics      Encourage discontinuation           of antibiotics    ----------------------------     -----------------------------         PCT >= 0.50 ng/mL              PCT 0.26 - 0.50 ng/mL               AND        <80% decrease in PCT             Encourage initiation of                                             antibiotics       Encourage continuation           of antibiotics    ----------------------------     -----------------------------        PCT >= 0.50 ng/mL                  PCT > 0.50 ng/mL               AND         increase in PCT                  Strongly encourage  initiation of antibiotics    Strongly encourage escalation           of antibiotics                                     -----------------------------                                           PCT <= 0.25 ng/mL                                                 OR                                        > 80% decrease in PCT                                      Discontinue / Do not initiate                                             antibiotics  Performed at [facility], [address] [place], [place] [address]   Urinalysis, Routine w reflex microscopic Urine, Clean Catch     Status: Abnormal   Collection Time: 08/16/20 10:30 PM  Result Value Ref Range   Color, Urine YELLOW YELLOW   APPearance HAZY  (A) CLEAR   Specific Gravity, Urine 1.009 1.005 - 1.030   pH 5.0 5.0 - 8.0   Glucose, UA NEGATIVE NEGATIVE mg/dL   Hgb urine dipstick SMALL (A) NEGATIVE   Bilirubin Urine NEGATIVE NEGATIVE   Ketones, ur NEGATIVE NEGATIVE mg/dL   Protein, ur 30 (A) NEGATIVE mg/dL   Nitrite NEGATIVE NEGATIVE   Leukocytes,Ua NEGATIVE NEGATIVE   RBC / HPF 11-20 0 - 5 RBC/hpf   WBC, UA 0-5 0 - 5 WBC/hpf   Bacteria, UA NONE SEEN NONE SEEN   Squamous Epithelial / LPF 6-10 0 - 5   Hyaline Casts, UA PRESENT     Comment: Performed at [facility], [address], [place], [place] [address]  Urine culture     Status: None   Collection Time: 08/16/20 10:30 PM   Specimen: Urine, Clean Catch  Result Value Ref Range   Specimen Description      URINE, CLEAN CATCH Performed at [facility], [address], [place], [place] [address]    Special Requests      NONE Performed at [facility], [address], [place], [place] [address]    Culture      NO GROWTH Performed at [facility], [place] [address] [place], [place] [address]    Report Status 08/18/2020 FINAL   Basic metabolic panel     Status: Abnormal   Collection Time: 08/17/20  3:47 AM  Result Value Ref Range   Sodium 140 135 - 145 mmol/L   Potassium 4.2 3.5 - 5.1  mmol/L   Chloride 102 98 - 111 mmol/L   CO2 31 22 - 32 mmol/L   Glucose, Bld 86 70 - 99 mg/dL    Comment: Glucose reference range applies only to samples taken after fasting for at least 8 hours.   BUN 33 (H) 8 - 23 mg/dL   Creatinine, Ser 1.24 0.61 - 1.24 mg/dL   Calcium 9.0 8.9 - 10.3 mg/dL   GFR, Estimated 53 (L) >60 mL/min    Comment: (NOTE) Calculated using the CKD-EPI Creatinine Equation (2021)    Anion gap 7 5 - 15    Comment: Performed at [facility], [address], [place], [place] [address]  CBC     Status: Abnormal   Collection Time: 08/17/20  3:47 AM  Result Value Ref Range   WBC 4.4 4.0 - 10.5 K/uL   RBC 4.32 4.22 - 5.81 MIL/uL   Hemoglobin 11.5 (L) 13.0 - 17.0  g/dL   HCT 37.6 (L) 39.0 - 52.0 %   MCV 87.0 80.0 - 100.0 fL   MCH 26.6 26.0 - 34.0 pg   MCHC 30.6 30.0 - 36.0 g/dL   RDW 17.2 (H) 11.5 - 15.5 %   Platelets 146 (L) 150 - 400 K/uL   nRBC 0.0 0.0 - 0.2 %    Comment: Performed at [facility], [address], [place], [place] [address]  ECHOCARDIOGRAM COMPLETE     Status: None   Collection Time: 08/17/20 11:48 AM  Result Value Ref Range   Weight 2,430.35 oz   Height 66 in   BP 145/85 mmHg   Single Plane A2C EF 23.6 %   Single Plane A4C EF 27.9 %   Calc EF 26.8 %   Area-P 1/2 4.60 cm2   S' Lateral 4.70 cm  Basic metabolic panel     Status: Abnormal   Collection Time: 08/18/20  8:26 AM  Result Value Ref Range   Sodium 141 135 - 145 mmol/L   Potassium 4.5 3.5 - 5.1 mmol/L   Chloride 107 98 - 111 mmol/L   CO2 29 22 - 32 mmol/L   Glucose, Bld 86 70 - 99 mg/dL    Comment: Glucose reference range applies only to samples taken after fasting for at least 8 hours.   BUN 28 (H) 8 - 23 mg/dL   Creatinine, Ser 1.26 (H) 0.61 - 1.24 mg/dL   Calcium 9.2 8.9 - 10.3 mg/dL   GFR, Estimated 52 (L) >60 mL/min    Comment: (NOTE) Calculated using the CKD-EPI Creatinine Equation (2021)    Anion gap 5 5 - 15    Comment: Performed at [facility], [address] [place], [place] [address]  Magnesium     Status: None   Collection Time: 08/18/20  8:26 AM  Result Value Ref Range   Magnesium 1.9 1.7 - 2.4 mg/dL    Comment: Performed at [facility], [address], [place],  [address]  CBC     Status: Abnormal   Collection Time: 08/18/20  8:26 AM  Result Value Ref Range   WBC 3.2 (L) 4.0 - 10.5 K/uL   RBC 4.26 4.22 - 5.81 MIL/uL   Hemoglobin 11.4 (L) 13.0 - 17.0 g/dL   HCT 37.7 (L) 39.0 - 52.0 %   MCV 88.5 80.0 - 100.0 fL   MCH 26.8 26.0 - 34.0 pg   MCHC 30.2 30.0 - 36.0 g/dL   RDW 17.3 (H) 11.5 - 15.5 %   Platelets 132 (L) 150 -  400 K/uL   nRBC 0.0 0.0 - 0.2 %    Comment: Performed at [facility], [address] [place], [place]  [address]  Protime-INR     Status: Abnormal   Collection Time: 08/18/20  8:26 AM  Result Value Ref Range   Prothrombin Time 17.6 (H) 11.4 - 15.2 seconds   INR 1.5 (H) 0.8 - 1.2    Comment: (NOTE) INR goal varies based on device and disease states. Performed at [facility], [address] [place], [place] [address]   Basic metabolic panel     Status: Abnormal   Collection Time: 08/19/20  5:28 AM  Result Value Ref Range   Sodium 143 135 - 145 mmol/L   Potassium 4.4 3.5 - 5.1 mmol/L   Chloride 108 98 - 111 mmol/L   CO2 29 22 - 32 mmol/L   Glucose, Bld 101 (H) 70 - 99 mg/dL    Comment: Glucose reference range applies only to samples taken after fasting for at least 8 hours.   BUN 31 (H) 8 - 23 mg/dL   Creatinine, Ser 1.27 (H) 0.61 - 1.24 mg/dL   Calcium 9.4 8.9 - 10.3 mg/dL   GFR, Estimated 52 (L) >60 mL/min    Comment: (NOTE) Calculated using the CKD-EPI Creatinine Equation (2021)    Anion gap 6 5 - 15    Comment: Performed at [facility], [address], [place], [place] [address]  Magnesium     Status: None   Collection Time: 08/19/20  5:28 AM  Result Value Ref Range   Magnesium 1.8 1.7 - 2.4 mg/dL    Comment: Performed at [facility], [address] [place], [place] [address]  CBC with Differential/Platelet     Status: Abnormal   Collection Time: 08/29/20 10:25 AM  Result Value Ref Range   WBC 3.5 3.4 - 10.8 x10E3/uL   RBC 4.39 4.14 - 5.80 x10E6/uL   Hemoglobin 11.7 (L) 13.0 - 17.7 g/dL   Hematocrit 37.4 (L) 37.5 - 51.0 %   MCV 85 79 - 97 fL   MCH 26.7 26.6 - 33.0 pg   MCHC 31.3 (L) 31.5 - 35.7 g/dL   RDW 15.5 (H) 11.6 - 15.4 %   Platelets 159 150 - 450 x10E3/uL   Neutrophils 55 Not Estab. %   Lymphs 29 Not Estab. %   Monocytes 8 Not Estab. %   Eos 7 Not Estab. %   Basos 1 Not Estab. %   Neutrophils Absolute 2.0 1.4 - 7.0 x10E3/uL   Lymphocytes Absolute 1.0 0.7 - 3.1 x10E3/uL   Monocytes Absolute 0.3 0.1 - 0.9 x10E3/uL   EOS (ABSOLUTE) 0.2 0.0 - 0.4 x10E3/uL    Basophils Absolute 0.0 0.0 - 0.2 x10E3/uL   Immature Granulocytes 0 Not Estab. %   Immature Grans (Abs) 0.0 0.0 - 0.1 x10E3/uL  CMP14+EGFR     Status: Abnormal   Collection Time: 08/29/20 10:25 AM  Result Value Ref Range   Glucose 111 (H) 65 - 99 mg/dL   BUN 24 10 - 36 mg/dL   Creatinine, Ser 1.27 0.76 - 1.27 mg/dL   eGFR 52 (L) >59 mL/min/1.73   BUN/Creatinine Ratio 19 10 - 24   Sodium 146 (H) 134 - 144 mmol/L   Potassium 4.1 3.5 - 5.2 mmol/L   Chloride 101 96 - 106 mmol/L   CO2 32 (H) 20 - 29 mmol/L   Calcium 9.6 8.6 - 10.2 mg/dL   Total Protein 6.5 6.0 - 8.5 g/dL  Albumin 3.9 3.5 - 4.6 g/dL   Globulin, Total 2.6 1.5 - 4.5 g/dL   Albumin/Globulin Ratio 1.5 1.2 - 2.2   Bilirubin Total 0.3 0.0 - 1.2 mg/dL   Alkaline Phosphatase 143 (H) 44 - 121 IU/L   AST 12 0 - 40 IU/L   ALT 10 0 - 44 IU/L  Urinalysis, Routine w reflex microscopic     Status: Abnormal   Collection Time: 09/04/20 12:43 PM  Result Value Ref Range   Specific Gravity, UA 1.020 1.005 - 1.030   pH, UA 6.0 5.0 - 7.5   Color, UA Red (A) Yellow   Appearance Ur Turbid (A) Clear   Leukocytes,UA Negative Negative   Protein,UA 3+ (A) Negative/Trace   Glucose, UA Negative Negative   Ketones, UA Negative Negative   RBC, UA 3+ (A) Negative   Bilirubin, UA Negative Negative   Urobilinogen, Ur 1.0 0.2 - 1.0 mg/dL   Nitrite, UA Negative Negative   Microscopic Examination See below:   Microscopic Examination     Status: Abnormal   Collection Time: 09/04/20 12:43 PM   Urine  Result Value Ref Range   WBC, UA None seen 0 - 5 /hpf   RBC >30 (A) 0 - 2 /hpf   Epithelial Cells (non renal) 0-10 0 - 10 /hpf   Renal Epithel, UA None seen None seen /hpf   Mucus, UA Present Not Estab.   Bacteria, UA Few (A) None seen/Few  Comprehensive metabolic panel     Status: Abnormal   Collection Time: 09/29/20  5:02 PM  Result Value Ref Range   Sodium 142 135 - 145 mmol/L   Potassium 3.8 3.5 - 5.1 mmol/L   Chloride 104 98 - 111 mmol/L    CO2 32 22 - 32 mmol/L   Glucose, Bld 108 (H) 70 - 99 mg/dL    Comment: Glucose reference range applies only to samples taken after fasting for at least 8 hours.   BUN 28 (H) 8 - 23 mg/dL   Creatinine, Ser 1.13 0.61 - 1.24 mg/dL   Calcium 9.2 8.9 - 10.3 mg/dL   Total Protein 6.5 6.5 - 8.1 g/dL   Albumin 3.6 3.5 - 5.0 g/dL   AST 16 15 - 41 U/L   ALT 10 0 - 44 U/L   Alkaline Phosphatase 141 (H) 38 - 126 U/L   Total Bilirubin 0.8 0.3 - 1.2 mg/dL   GFR, Estimated 59 (L) >60 mL/min    Comment: (NOTE) Calculated using the CKD-EPI Creatinine Equation (2021)    Anion gap 6 5 - 15    Comment: Performed at [facility], [address] [place], [name] [address]  CBC with Differential     Status: Abnormal   Collection Time: 09/29/20  5:02 PM  Result Value Ref Range   WBC 3.4 (L) 4.0 - 10.5 K/uL   RBC 4.10 (L) 4.22 - 5.81 MIL/uL   Hemoglobin 11.1 (L) 13.0 - 17.0 g/dL   HCT 36.6 (L) 39.0 - 52.0 %   MCV 89.3 80.0 - 100.0 fL   MCH 27.1 26.0 - 34.0 pg   MCHC 30.3 30.0 - 36.0 g/dL   RDW 18.4 (H) 11.5 - 15.5 %   Platelets 134 (L) 150 - 400 K/uL   nRBC 0.0 0.0 - 0.2 %   Neutrophils Relative % 58 %   Neutro Abs 2.0 1.7 - 7.7 K/uL   Lymphocytes Relative 27 %   Lymphs Abs 0.9 0.7 - 4.0 K/uL   Monocytes  Relative 10 %   Monocytes Absolute 0.3 0.1 - 1.0 K/uL   Eosinophils Relative 4 %   Eosinophils Absolute 0.1 0.0 - 0.5 K/uL   Basophils Relative 1 %   Basophils Absolute 0.0 0.0 - 0.1 K/uL   Immature Granulocytes 0 %   Abs Immature Granulocytes 0.00 0.00 - 0.07 K/uL    Comment: Performed at [facility], [address] [place], [place] [address]  Urinalysis, Routine w reflex microscopic Urine, Clean Catch     Status: Abnormal   Collection Time: 09/29/20  5:02 PM  Result Value Ref Range   Color, Urine YELLOW YELLOW   APPearance HAZY (A) CLEAR   Specific Gravity, Urine 1.012 1.005 - 1.030   pH 6.0 5.0 - 8.0   Glucose, UA NEGATIVE NEGATIVE mg/dL   Hgb urine dipstick LARGE (A) NEGATIVE    Bilirubin Urine NEGATIVE NEGATIVE   Ketones, ur NEGATIVE NEGATIVE mg/dL   Protein, ur 100 (A) NEGATIVE mg/dL   Nitrite NEGATIVE NEGATIVE   Leukocytes,Ua NEGATIVE NEGATIVE   RBC / HPF >50 (H) 0 - 5 RBC/hpf   WBC, UA 6-10 0 - 5 WBC/hpf   Bacteria, UA RARE (A) NONE SEEN   Squamous Epithelial / LPF 0-5 0 - 5   Mucus PRESENT     Comment: Performed at [facility], [address], [place], [place] [address]  Urine culture     Status: None   Collection Time: 09/29/20  5:02 PM   Specimen: Urine, Random  Result Value Ref Range   Specimen Description      URINE, RANDOM Performed at [facility], [address] [place], [place] [address]    Special Requests      NONE Performed at [facility], [name] [place], [place] [address]    Culture      NO GROWTH Performed at [facility], [place] [address] [place],  [address]    Report Status 10/01/2020 FINAL   Urinalysis, microscopic only     Status: Abnormal   Collection Time: 11/13/20 11:08 AM  Result Value Ref Range   WBC, UA None seen 0 - 5 /hpf   RBC >30 (A) 0 - 2 /hpf   Epithelial Cells (non renal) None seen 0 - 10 /hpf   Renal Epithel, UA None seen None seen /hpf    BMET  PSA  No results found for: TESTOSTERONE  Results for orders placed or performed in visit on 11/13/20 (from the past 24 hour(s))  Urinalysis, microscopic only     Status: Abnormal   Collection Time: 11/13/20 11:08 AM  Result Value Ref Range   WBC, UA None seen 0 - 5 /hpf   RBC >30 (A) 0 - 2 /hpf   Epithelial Cells (non renal) None seen 0 - 10 /hpf   Renal Epithel, UA None seen None seen /hpf   Narrative   Performed at:  [facility] [address] [place], [place]  [id number] Lab Director: Mina Marble MT, Phone:  [phone]   *Note: Due to a large number of results and/or encounters for the requested time period, some results have not been displayed. A complete set of results can be found in Results Review.        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  He has some mild intermittant hematuria that has improved off of Eliquis.  He remains a poor surgical candidate but his condition has improved since his last visit with me in late May and if he develops more profound, symptomatic bleeding he may require intervention.  I will check a CBC Scott [name].    Incontinence.   He is voiding comfortably but has incontinence and remains on Myrbetriq.   No changes at this time.  No orders of the defined types were placed in this encounter.    Orders Placed This Encounter  Procedures   CBC With differential/Platelet   Urinalysis, microscopic only       Return in about 3 months (around 02/13/2021).   CC: Dettinger, Fransisca Kaufmann, MD      [name] 11/13/2020 Patient ID: Scott Scott [name], male   DOB: 01/09/1922, 87 y.o.   MRN: [id number]

## 2020-11-13 NOTE — Progress Notes (Signed)
Urological Symptom Review  Patient is experiencing the following symptoms: Frequent urination Hard to postpone urination Get up at night to urinate Leakage of urine Stream starts and stops Trouble starting stream Blood in urine   Review of Systems  Gastrointestinal (upper)  : Negative for upper GI symptoms  Gastrointestinal (lower) : Negative for lower GI symptoms  Constitutional : Negative for symptoms  Skin: Negative for skin symptoms  Eyes: Negative for eye symptoms  Ear/Nose/Throat : Sinus problems  Hematologic/Lymphatic: Negative for Hematologic/Lymphatic symptoms  Cardiovascular : Leg swelling  Respiratory : Negative for respiratory symptoms  Endocrine: Negative for endocrine symptoms  Musculoskeletal: Negative for musculoskeletal symptoms  Neurological: Headaches  Psychologic: Negative for psychiatric symptoms

## 2020-11-14 LAB — CBC WITH DIFFERENTIAL
Basophils Absolute: 0 x10E3/uL (ref 0.0–0.2)
Basos: 1 %
EOS (ABSOLUTE): 0.2 x10E3/uL (ref 0.0–0.4)
Eos: 6 %
Hematocrit: 38.4 % (ref 37.5–51.0)
Hemoglobin: 11.6 g/dL — ABNORMAL LOW (ref 13.0–17.7)
Immature Grans (Abs): 0 x10E3/uL (ref 0.0–0.1)
Immature Granulocytes: 0 %
Lymphocytes Absolute: 1.1 x10E3/uL (ref 0.7–3.1)
Lymphs: 30 %
MCH: 26 pg — ABNORMAL LOW (ref 26.6–33.0)
MCHC: 30.2 g/dL — ABNORMAL LOW (ref 31.5–35.7)
MCV: 86 fL (ref 79–97)
Monocytes Absolute: 0.3 x10E3/uL (ref 0.1–0.9)
Monocytes: 9 %
Neutrophils Absolute: 2 x10E3/uL (ref 1.4–7.0)
Neutrophils: 54 %
RBC: 4.47 x10E6/uL (ref 4.14–5.80)
RDW: 15.7 % — ABNORMAL HIGH (ref 11.6–15.4)
WBC: 3.7 x10E3/uL (ref 3.4–10.8)

## 2020-11-14 NOTE — Progress Notes (Signed)
Sent via mychart and mail. 

## 2020-11-24 ENCOUNTER — Other Ambulatory Visit: Payer: Self-pay | Admitting: Urology

## 2020-11-24 ENCOUNTER — Other Ambulatory Visit: Payer: Self-pay | Admitting: Family Medicine

## 2020-11-24 DIAGNOSIS — N138 Other obstructive and reflux uropathy: Secondary | ICD-10-CM

## 2020-11-26 ENCOUNTER — Other Ambulatory Visit: Payer: Self-pay

## 2020-11-26 DIAGNOSIS — N138 Other obstructive and reflux uropathy: Secondary | ICD-10-CM

## 2020-11-26 MED ORDER — MYRBETRIQ 25 MG PO TB24: 25.0000 mg | ORAL_TABLET | Freq: Every day | ORAL | 3 refills | Status: DC

## 2020-12-02 ENCOUNTER — Other Ambulatory Visit: Payer: Self-pay | Admitting: Family Medicine

## 2020-12-02 DIAGNOSIS — F0150 Vascular dementia without behavioral disturbance: Secondary | ICD-10-CM | POA: Diagnosis not present

## 2020-12-02 DIAGNOSIS — I5042 Chronic combined systolic (congestive) and diastolic (congestive) heart failure: Secondary | ICD-10-CM | POA: Diagnosis not present

## 2020-12-02 DIAGNOSIS — Z515 Encounter for palliative care: Secondary | ICD-10-CM | POA: Diagnosis not present

## 2021-01-05 DIAGNOSIS — R569 Unspecified convulsions: Secondary | ICD-10-CM | POA: Diagnosis not present

## 2021-01-05 DIAGNOSIS — I517 Cardiomegaly: Secondary | ICD-10-CM | POA: Diagnosis not present

## 2021-01-05 DIAGNOSIS — J189 Pneumonia, unspecified organism: Secondary | ICD-10-CM | POA: Diagnosis not present

## 2021-01-05 DIAGNOSIS — C679 Malignant neoplasm of bladder, unspecified: Secondary | ICD-10-CM | POA: Diagnosis not present

## 2021-01-05 DIAGNOSIS — Z792 Long term (current) use of antibiotics: Secondary | ICD-10-CM | POA: Diagnosis not present

## 2021-01-05 DIAGNOSIS — J449 Chronic obstructive pulmonary disease, unspecified: Secondary | ICD-10-CM | POA: Insufficient documentation

## 2021-01-05 DIAGNOSIS — R52 Pain, unspecified: Secondary | ICD-10-CM | POA: Diagnosis not present

## 2021-01-05 DIAGNOSIS — E869 Volume depletion, unspecified: Secondary | ICD-10-CM | POA: Diagnosis not present

## 2021-01-05 DIAGNOSIS — I6203 Nontraumatic chronic subdural hemorrhage: Secondary | ICD-10-CM | POA: Diagnosis not present

## 2021-01-05 DIAGNOSIS — Z881 Allergy status to other antibiotic agents status: Secondary | ICD-10-CM | POA: Diagnosis not present

## 2021-01-05 DIAGNOSIS — I739 Peripheral vascular disease, unspecified: Secondary | ICD-10-CM | POA: Diagnosis present

## 2021-01-05 DIAGNOSIS — Z882 Allergy status to sulfonamides status: Secondary | ICD-10-CM | POA: Diagnosis not present

## 2021-01-05 DIAGNOSIS — Z86718 Personal history of other venous thrombosis and embolism: Secondary | ICD-10-CM | POA: Diagnosis not present

## 2021-01-05 DIAGNOSIS — I4891 Unspecified atrial fibrillation: Secondary | ICD-10-CM | POA: Diagnosis present

## 2021-01-05 DIAGNOSIS — N3000 Acute cystitis without hematuria: Secondary | ICD-10-CM | POA: Diagnosis not present

## 2021-01-05 DIAGNOSIS — N390 Urinary tract infection, site not specified: Secondary | ICD-10-CM | POA: Diagnosis not present

## 2021-01-05 DIAGNOSIS — E785 Hyperlipidemia, unspecified: Secondary | ICD-10-CM | POA: Diagnosis present

## 2021-01-05 DIAGNOSIS — E875 Hyperkalemia: Secondary | ICD-10-CM | POA: Diagnosis not present

## 2021-01-05 DIAGNOSIS — Z79899 Other long term (current) drug therapy: Secondary | ICD-10-CM | POA: Diagnosis not present

## 2021-01-05 DIAGNOSIS — Z86711 Personal history of pulmonary embolism: Secondary | ICD-10-CM | POA: Diagnosis not present

## 2021-01-05 DIAGNOSIS — J440 Chronic obstructive pulmonary disease with acute lower respiratory infection: Secondary | ICD-10-CM | POA: Diagnosis present

## 2021-01-05 DIAGNOSIS — J158 Pneumonia due to other specified bacteria: Secondary | ICD-10-CM | POA: Diagnosis present

## 2021-01-05 DIAGNOSIS — R310 Gross hematuria: Secondary | ICD-10-CM | POA: Diagnosis not present

## 2021-01-05 DIAGNOSIS — C680 Malignant neoplasm of urethra: Secondary | ICD-10-CM | POA: Diagnosis not present

## 2021-01-05 DIAGNOSIS — I2510 Atherosclerotic heart disease of native coronary artery without angina pectoris: Secondary | ICD-10-CM | POA: Diagnosis present

## 2021-01-05 DIAGNOSIS — Z89612 Acquired absence of left leg above knee: Secondary | ICD-10-CM | POA: Diagnosis not present

## 2021-01-05 DIAGNOSIS — R4182 Altered mental status, unspecified: Secondary | ICD-10-CM | POA: Diagnosis not present

## 2021-01-05 DIAGNOSIS — F0390 Unspecified dementia without behavioral disturbance: Secondary | ICD-10-CM | POA: Diagnosis present

## 2021-01-05 DIAGNOSIS — I5042 Chronic combined systolic (congestive) and diastolic (congestive) heart failure: Secondary | ICD-10-CM | POA: Diagnosis not present

## 2021-01-05 DIAGNOSIS — R404 Transient alteration of awareness: Secondary | ICD-10-CM | POA: Diagnosis not present

## 2021-01-05 DIAGNOSIS — Z8679 Personal history of other diseases of the circulatory system: Secondary | ICD-10-CM | POA: Diagnosis not present

## 2021-01-05 DIAGNOSIS — J9611 Chronic respiratory failure with hypoxia: Secondary | ICD-10-CM | POA: Diagnosis not present

## 2021-01-05 DIAGNOSIS — Z9981 Dependence on supplemental oxygen: Secondary | ICD-10-CM | POA: Diagnosis not present

## 2021-01-05 DIAGNOSIS — Z20822 Contact with and (suspected) exposure to covid-19: Secondary | ICD-10-CM | POA: Diagnosis not present

## 2021-01-05 DIAGNOSIS — Z66 Do not resuscitate: Secondary | ICD-10-CM | POA: Diagnosis present

## 2021-01-05 DIAGNOSIS — E162 Hypoglycemia, unspecified: Secondary | ICD-10-CM | POA: Diagnosis not present

## 2021-01-05 DIAGNOSIS — I493 Ventricular premature depolarization: Secondary | ICD-10-CM | POA: Diagnosis present

## 2021-01-05 DIAGNOSIS — E161 Other hypoglycemia: Secondary | ICD-10-CM | POA: Diagnosis not present

## 2021-01-05 DIAGNOSIS — I110 Hypertensive heart disease with heart failure: Secondary | ICD-10-CM | POA: Diagnosis not present

## 2021-01-05 DIAGNOSIS — M25551 Pain in right hip: Secondary | ICD-10-CM | POA: Diagnosis present

## 2021-01-06 DIAGNOSIS — J9611 Chronic respiratory failure with hypoxia: Secondary | ICD-10-CM | POA: Insufficient documentation

## 2021-01-07 ENCOUNTER — Telehealth: Payer: Self-pay | Admitting: Family Medicine

## 2021-01-07 NOTE — Telephone Encounter (Signed)
Pt needs hospital follow up

## 2021-01-08 DIAGNOSIS — J9611 Chronic respiratory failure with hypoxia: Secondary | ICD-10-CM | POA: Diagnosis not present

## 2021-01-08 DIAGNOSIS — C679 Malignant neoplasm of bladder, unspecified: Secondary | ICD-10-CM | POA: Diagnosis not present

## 2021-01-08 DIAGNOSIS — I5042 Chronic combined systolic (congestive) and diastolic (congestive) heart failure: Secondary | ICD-10-CM | POA: Diagnosis not present

## 2021-01-08 DIAGNOSIS — Z86711 Personal history of pulmonary embolism: Secondary | ICD-10-CM | POA: Diagnosis not present

## 2021-01-08 DIAGNOSIS — M47816 Spondylosis without myelopathy or radiculopathy, lumbar region: Secondary | ICD-10-CM | POA: Diagnosis not present

## 2021-01-08 DIAGNOSIS — F0390 Unspecified dementia without behavioral disturbance: Secondary | ICD-10-CM | POA: Diagnosis not present

## 2021-01-08 DIAGNOSIS — F32A Depression, unspecified: Secondary | ICD-10-CM | POA: Diagnosis not present

## 2021-01-08 DIAGNOSIS — I110 Hypertensive heart disease with heart failure: Secondary | ICD-10-CM | POA: Diagnosis not present

## 2021-01-08 DIAGNOSIS — N3000 Acute cystitis without hematuria: Secondary | ICD-10-CM | POA: Diagnosis not present

## 2021-01-08 DIAGNOSIS — Z993 Dependence on wheelchair: Secondary | ICD-10-CM | POA: Diagnosis not present

## 2021-01-08 DIAGNOSIS — J189 Pneumonia, unspecified organism: Secondary | ICD-10-CM | POA: Diagnosis not present

## 2021-01-08 DIAGNOSIS — E785 Hyperlipidemia, unspecified: Secondary | ICD-10-CM | POA: Diagnosis not present

## 2021-01-08 DIAGNOSIS — Z89612 Acquired absence of left leg above knee: Secondary | ICD-10-CM | POA: Diagnosis not present

## 2021-01-08 DIAGNOSIS — M160 Bilateral primary osteoarthritis of hip: Secondary | ICD-10-CM | POA: Diagnosis not present

## 2021-01-08 DIAGNOSIS — I4891 Unspecified atrial fibrillation: Secondary | ICD-10-CM | POA: Diagnosis not present

## 2021-01-08 DIAGNOSIS — Z9981 Dependence on supplemental oxygen: Secondary | ICD-10-CM | POA: Diagnosis not present

## 2021-01-08 DIAGNOSIS — I739 Peripheral vascular disease, unspecified: Secondary | ICD-10-CM | POA: Diagnosis not present

## 2021-01-08 DIAGNOSIS — Z86718 Personal history of other venous thrombosis and embolism: Secondary | ICD-10-CM | POA: Diagnosis not present

## 2021-01-08 DIAGNOSIS — I2510 Atherosclerotic heart disease of native coronary artery without angina pectoris: Secondary | ICD-10-CM | POA: Diagnosis not present

## 2021-01-08 DIAGNOSIS — J440 Chronic obstructive pulmonary disease with acute lower respiratory infection: Secondary | ICD-10-CM | POA: Diagnosis not present

## 2021-01-08 DIAGNOSIS — F419 Anxiety disorder, unspecified: Secondary | ICD-10-CM | POA: Diagnosis not present

## 2021-01-09 DIAGNOSIS — J189 Pneumonia, unspecified organism: Secondary | ICD-10-CM | POA: Diagnosis not present

## 2021-01-09 DIAGNOSIS — I5042 Chronic combined systolic (congestive) and diastolic (congestive) heart failure: Secondary | ICD-10-CM | POA: Diagnosis not present

## 2021-01-09 DIAGNOSIS — J9611 Chronic respiratory failure with hypoxia: Secondary | ICD-10-CM | POA: Diagnosis not present

## 2021-01-09 DIAGNOSIS — N3000 Acute cystitis without hematuria: Secondary | ICD-10-CM | POA: Diagnosis not present

## 2021-01-09 DIAGNOSIS — J440 Chronic obstructive pulmonary disease with acute lower respiratory infection: Secondary | ICD-10-CM | POA: Diagnosis not present

## 2021-01-09 DIAGNOSIS — I110 Hypertensive heart disease with heart failure: Secondary | ICD-10-CM | POA: Diagnosis not present

## 2021-01-09 NOTE — Telephone Encounter (Signed)
Transition Care Management Follow-up Telephone Call Date of discharge and from where: 01/07/21 - [facility] Diagnosis:  pneumonia, AMS How have you been since you were released from the hospital? Doing a lot better than before he went Any questions or concerns? No  Items Reviewed: Did the pt receive and understand the discharge instructions provided? Yes  Medications obtained and verified? Yes  Other? No  Any new allergies since your discharge? No  Dietary orders reviewed? Yes Do you have support at home? Yes   Home Care and Equipment/Supplies: Were home health services ordered? yes If so, what is the name of the agency? [facility]  Has the agency set up a time to come to the patient's home? yes Were any new equipment or medical supplies ordered?  No What is the name of the medical supply agency? N/A Were you able to get the supplies/equipment? no Do you have any questions related to the use of the equipment or supplies? No  Functional Questionnaire: (I = Independent and D = Dependent) ADLs: I with assistance - has caregivers  Bathing/Dressing- I with assistance  Meal Prep- D  Eating- I  Maintaining continence- I  Transferring/Ambulation- I - transfers himself from bed to wheelchair and wheels himself around  Managing Meds- D  Follow up appointments reviewed:  PCP Hospital f/u appt confirmed? Yes  Scheduled to see Dettinger on 1010/22 @ 1:10. [facility] f/u appt confirmed? No   Are transportation arrangements needed? No  If their condition worsens, is the pt aware to call PCP or go to the Emergency Dept.? Yes Was the patient provided with contact information for the PCP's office or ED? Yes Was to pt encouraged to call back with questions or concerns? Yes

## 2021-01-12 ENCOUNTER — Telehealth: Payer: Self-pay | Admitting: Family Medicine

## 2021-01-12 DIAGNOSIS — N3000 Acute cystitis without hematuria: Secondary | ICD-10-CM | POA: Diagnosis not present

## 2021-01-12 DIAGNOSIS — J9611 Chronic respiratory failure with hypoxia: Secondary | ICD-10-CM | POA: Diagnosis not present

## 2021-01-12 DIAGNOSIS — I5042 Chronic combined systolic (congestive) and diastolic (congestive) heart failure: Secondary | ICD-10-CM | POA: Diagnosis not present

## 2021-01-12 DIAGNOSIS — J440 Chronic obstructive pulmonary disease with acute lower respiratory infection: Secondary | ICD-10-CM | POA: Diagnosis not present

## 2021-01-12 DIAGNOSIS — I110 Hypertensive heart disease with heart failure: Secondary | ICD-10-CM | POA: Diagnosis not present

## 2021-01-12 DIAGNOSIS — J189 Pneumonia, unspecified organism: Secondary | ICD-10-CM | POA: Diagnosis not present

## 2021-01-12 NOTE — Telephone Encounter (Signed)
With the pressure changes in the plane ride, that might affect his lungs and cause him to have more issues.  I would think he would do better by motor vehicle, and I would say he could do fine on a trip with a motor vehicle as long as he gets up every couple hours out of the car and moves to make sure he does not have any blood clots.  As far as the Eliquis that is something that we have discussed before and at his age the risks may outweigh the benefit of the possible prevention.  I would be agreeable if they wanted to stop it, I would also be agreeable if they wanted to keep it.  At this point it is just weighing the risks of fall and bleeding versus the benefits of preventing future clots or stroke.  Let me know what they decide Caryl Pina, MD [place] 01/12/2021, 3:08 PM

## 2021-01-12 NOTE — Telephone Encounter (Signed)
Spoke with [name]. He is aware that driving would be best when traveling with pt.  Believes it is best to stay on Eliquis for now.

## 2021-01-13 DIAGNOSIS — I5042 Chronic combined systolic (congestive) and diastolic (congestive) heart failure: Secondary | ICD-10-CM | POA: Diagnosis not present

## 2021-01-13 DIAGNOSIS — J9611 Chronic respiratory failure with hypoxia: Secondary | ICD-10-CM | POA: Diagnosis not present

## 2021-01-13 DIAGNOSIS — N3000 Acute cystitis without hematuria: Secondary | ICD-10-CM | POA: Diagnosis not present

## 2021-01-13 DIAGNOSIS — J189 Pneumonia, unspecified organism: Secondary | ICD-10-CM | POA: Diagnosis not present

## 2021-01-13 DIAGNOSIS — J440 Chronic obstructive pulmonary disease with acute lower respiratory infection: Secondary | ICD-10-CM | POA: Diagnosis not present

## 2021-01-13 DIAGNOSIS — I110 Hypertensive heart disease with heart failure: Secondary | ICD-10-CM | POA: Diagnosis not present

## 2021-01-15 ENCOUNTER — Other Ambulatory Visit: Payer: Self-pay | Admitting: Family Medicine

## 2021-01-15 DIAGNOSIS — J9611 Chronic respiratory failure with hypoxia: Secondary | ICD-10-CM | POA: Diagnosis not present

## 2021-01-15 DIAGNOSIS — N3000 Acute cystitis without hematuria: Secondary | ICD-10-CM | POA: Diagnosis not present

## 2021-01-15 DIAGNOSIS — I5042 Chronic combined systolic (congestive) and diastolic (congestive) heart failure: Secondary | ICD-10-CM | POA: Diagnosis not present

## 2021-01-15 DIAGNOSIS — I110 Hypertensive heart disease with heart failure: Secondary | ICD-10-CM | POA: Diagnosis not present

## 2021-01-15 DIAGNOSIS — J440 Chronic obstructive pulmonary disease with acute lower respiratory infection: Secondary | ICD-10-CM | POA: Diagnosis not present

## 2021-01-15 DIAGNOSIS — J189 Pneumonia, unspecified organism: Secondary | ICD-10-CM | POA: Diagnosis not present

## 2021-01-19 ENCOUNTER — Ambulatory Visit (INDEPENDENT_AMBULATORY_CARE_PROVIDER_SITE_OTHER): Payer: Medicare Other | Admitting: Family Medicine

## 2021-01-19 ENCOUNTER — Telehealth: Payer: Self-pay | Admitting: Cardiology

## 2021-01-19 ENCOUNTER — Other Ambulatory Visit: Payer: Self-pay

## 2021-01-19 ENCOUNTER — Encounter: Payer: Self-pay | Admitting: Family Medicine

## 2021-01-19 VITALS — BP 100/55 | HR 120 | Ht 66.0 in

## 2021-01-19 DIAGNOSIS — F0280 Dementia in other diseases classified elsewhere without behavioral disturbance: Secondary | ICD-10-CM

## 2021-01-19 DIAGNOSIS — I70248 Atherosclerosis of native arteries of left leg with ulceration of other part of lower left leg: Secondary | ICD-10-CM | POA: Diagnosis not present

## 2021-01-19 DIAGNOSIS — G309 Alzheimer's disease, unspecified: Secondary | ICD-10-CM

## 2021-01-19 DIAGNOSIS — Z23 Encounter for immunization: Secondary | ICD-10-CM

## 2021-01-19 DIAGNOSIS — I70238 Atherosclerosis of native arteries of right leg with ulceration of other part of lower right leg: Secondary | ICD-10-CM

## 2021-01-19 DIAGNOSIS — J189 Pneumonia, unspecified organism: Secondary | ICD-10-CM

## 2021-01-19 NOTE — Telephone Encounter (Signed)
Spoke to patient 's son  Scott [name].Son states he would like to  take the patient on  01/29/21 to [facility].  Son  states it depends on how Dr Percival Spanish thinks the patient can travel  if by car or  plane. Scott [name] is aware Dr Percival Spanish is out of the office - will defer to Dr Percival Spanish for further instructions

## 2021-01-19 NOTE — Telephone Encounter (Signed)
Son of the patient called. The patient wanted to take a trip to [place]. The Son wanted to get Dr. Rosezella [place] opinion on the means of travel to take (flying vs driving)

## 2021-01-19 NOTE — Progress Notes (Signed)
BP (!) 100/55   Pulse (!) 120   Ht 5' 6"  (1.676 m)   SpO2 97%   BMI 25.82 kg/m    Subjective:   Patient ID: Scott [name], male    DOB: September 30, 1921, 87 y.o.   MRN: [id number]  HPI: Scott [name] is a 87 y.o. male presenting on 01/19/2021 for Medical Management of Chronic Issues, Atrial Fibrillation, and Heart Problem   HPI Transition Care Management Follow-up Telephone Call Date of discharge and from where: 01/07/21 - [facility] Diagnosis:  pneumonia, AMS How have you been since you were released from the hospital? Doing a lot better than before he went Any questions or concerns? No Contacted by Adalberto Cole on 01/09/2021  Transitional care office visit Patient was admitted to [facility] on 01/[facility]/2021 and discharged on 01/07/2021.  He was admitted for pneumonia was placed on antibiotics.  He was more confused initially but then that improved after antibiotics back to his baseline.  He was sent home on oral antibiotics.  Since leaving he has been doing better except for last night when he ran out of his oxygen or was not placed well.  His oxygen saturation was down here in the office but that is because his oxygen tank ran out.  Patient is complaining of right hip pain which they did an x-ray which showed osteoarthritis, at this point they are managing it with Tylenol and other medicine at home just in case.  Relevant past medical, surgical, family and social history reviewed and updated as indicated. Interim medical history since our last visit reviewed. Allergies and medications reviewed and updated.  Review of Systems  Constitutional:  Negative for chills and fever.  Eyes:  Negative for visual disturbance.  Respiratory:  Negative for shortness of breath and wheezing.   Cardiovascular:  Negative for chest pain and leg swelling.  Musculoskeletal:  Positive for arthralgias and myalgias.  Skin:  Negative for rash.  Psychiatric/Behavioral:  Positive for agitation and confusion.  Negative for behavioral problems.   All other systems reviewed and are negative.  Per HPI unless specifically indicated above   Allergies as of 01/19/2021       Reactions   Atorvastatin Hives, Rash   Rocephin [ceftriaxone Sodium In Dextrose] Other (See Comments)   Recurrent seizures        Medication List        Accurate as of January 19, 2021  2:01 PM. If you have any questions, ask your nurse or doctor.          STOP taking these medications    azithromycin 250 MG tablet Commonly known as: ZITHROMAX Stopped by: Fransisca Kaufmann Thalya Fouche, MD   benzonatate 100 MG capsule Commonly known as: TESSALON Stopped by: Worthy Rancher, MD       TAKE these medications    acetaminophen 500 MG tablet Commonly known as: TYLENOL Take 1,000 mg by mouth every 6 (six) hours as needed for moderate pain or headache.   albuterol (2.5 MG/3ML) 0.083% nebulizer solution Commonly known as: PROVENTIL Take 3 mLs (2.5 mg total) by nebulization every 6 (six) hours as needed for wheezing or shortness of breath.   albuterol 108 (90 Base) MCG/ACT inhaler Commonly known as: VENTOLIN HFA Inhale 2 puffs into the lungs every 6 (six) hours as needed for wheezing or shortness of breath.   aspirin 81 MG EC tablet Take 1 tablet (81 mg total) by mouth daily. Swallow whole.   carvedilol 6.25 MG tablet Commonly  known as: COREG Take 1 tablet (6.25 mg total) by mouth 2 (two) times daily with a meal.   Combivent Respimat 20-100 MCG/ACT Aers respimat Generic drug: Ipratropium-Albuterol Inhale 1 puff into the lungs every 6 (six) hours as needed for wheezing or shortness of breath (As needed for wheezing , cough or shortness of breath).   donepezil 10 MG tablet Commonly known as: ARICEPT Take 1 tablet (10 mg total) by mouth at bedtime.   Eliquis 2.5 MG Tabs tablet Generic drug: apixaban Take 2.5 mg by mouth 2 (two) times daily.   febuxostat 40 MG tablet Commonly known as: ULORIC Take 1 tablet  (40 mg total) by mouth daily.   fluticasone 50 MCG/ACT nasal spray Commonly known as: FLONASE Place 1 spray into both nostrils 2 (two) times daily as needed for allergies or rhinitis. SPRAY 1 SPRAY IN EACH NOSTRIL ONCE DAILY.   furosemide 20 MG tablet Commonly known as: LASIX TAKE 1 TABLET DAILY AS NEEDED (signs of volume overload).   gabapentin 400 MG capsule Commonly known as: NEURONTIN Take 1 capsule (400 mg total) by mouth 2 (two) times daily.   levETIRAcetam 250 MG tablet Commonly known as: KEPPRA TAKE ONE TABLET TWICE DAILY   levofloxacin 500 MG tablet Commonly known as: LEVAQUIN Take 1 tablet by mouth daily.   losartan 50 MG tablet Commonly known as: COZAAR Take 1 tablet by mouth daily.   Myrbetriq 25 MG Tb24 tablet Generic drug: mirabegron ER Take 1 tablet (25 mg total) by mouth daily. What changed: Another medication with the same name was removed. Continue taking this medication, and follow the directions you see here. Changed by: Fransisca Kaufmann Jyden Kromer, MD   pantoprazole 40 MG tablet Commonly known as: PROTONIX Take 1 tablet (40 mg total) by mouth daily.   sertraline 50 MG tablet Commonly known as: ZOLOFT Take 1 tablet (50 mg total) by mouth daily.   simvastatin 10 MG tablet Commonly known as: ZOCOR Take 1 tablet (10 mg total) by mouth daily at 6 PM.   tamsulosin 0.4 MG Caps capsule Commonly known as: FLOMAX TAKE 1 CAPSULE DAILY         Objective:   BP (!) 100/55   Pulse (!) 120   Ht 5' 6"  (1.676 m)   SpO2 97%   BMI 25.82 kg/m   Wt Readings from Last 3 Encounters:  10/02/20 160 lb (72.6 kg)  09/29/20 146 lb (66.2 kg)  08/29/20 146 lb (66.2 kg)    Physical Exam Vitals and nursing note reviewed.  Constitutional:      General: He is not in acute distress.    Appearance: He is well-developed. He is not diaphoretic.  Eyes:     General: No scleral icterus.    Conjunctiva/sclera: Conjunctivae normal.  Neck:     Thyroid: No thyromegaly.   Cardiovascular:     Rate and Rhythm: Normal rate.     Heart sounds: Normal heart sounds. No murmur heard. Pulmonary:     Effort: Pulmonary effort is normal. No respiratory distress.     Breath sounds: Normal breath sounds. No wheezing.  Musculoskeletal:     Cervical back: Neck supple.  Lymphadenopathy:     Cervical: No cervical adenopathy.  Skin:    General: Skin is warm and dry.     Findings: No rash.  Neurological:     Mental Status: He is alert and oriented to person, place, and time.     Coordination: Coordination normal.  Psychiatric:  Behavior: Behavior normal.      Assessment & Plan:   Problem List Items Addressed This Visit   None Visit Diagnoses     Pneumonia due to infectious organism, unspecified laterality, unspecified part of lung    -  Primary   Relevant Orders   CBC with Differential/Platelet   CMP14+EGFR   Vitamin B12       Will check blood work and  Follow up plan: Return if symptoms worsen or fail to improve, for pna.  Counseling provided for all of the vaccine components Orders Placed This Encounter  Procedures   CBC with Differential/Platelet   CMP14+EGFR   Vitamin B12    Caryl Pina, MD [facility] 01/19/2021, 2:01 PM

## 2021-01-19 NOTE — Addendum Note (Signed)
Addended by: Caryl Pina on: 01/19/2021 02:30 PM   Modules accepted: Orders

## 2021-01-20 DIAGNOSIS — J440 Chronic obstructive pulmonary disease with acute lower respiratory infection: Secondary | ICD-10-CM | POA: Diagnosis not present

## 2021-01-20 DIAGNOSIS — I5042 Chronic combined systolic (congestive) and diastolic (congestive) heart failure: Secondary | ICD-10-CM | POA: Diagnosis not present

## 2021-01-20 DIAGNOSIS — J189 Pneumonia, unspecified organism: Secondary | ICD-10-CM | POA: Diagnosis not present

## 2021-01-20 DIAGNOSIS — J9611 Chronic respiratory failure with hypoxia: Secondary | ICD-10-CM | POA: Diagnosis not present

## 2021-01-20 DIAGNOSIS — I110 Hypertensive heart disease with heart failure: Secondary | ICD-10-CM | POA: Diagnosis not present

## 2021-01-20 DIAGNOSIS — N3000 Acute cystitis without hematuria: Secondary | ICD-10-CM | POA: Diagnosis not present

## 2021-01-20 LAB — CBC WITH DIFFERENTIAL/PLATELET
Basophils Absolute: 0 10*3/uL (ref 0.0–0.2)
Basos: 1 %
EOS (ABSOLUTE): 0.1 10*3/uL (ref 0.0–0.4)
Eos: 3 %
Hematocrit: 35.1 % — ABNORMAL LOW (ref 37.5–51.0)
Hemoglobin: 11.1 g/dL — ABNORMAL LOW (ref 13.0–17.7)
Immature Grans (Abs): 0 10*3/uL (ref 0.0–0.1)
Immature Granulocytes: 0 %
Lymphocytes Absolute: 1.2 10*3/uL (ref 0.7–3.1)
Lymphs: 31 %
MCH: 26.1 pg — ABNORMAL LOW (ref 26.6–33.0)
MCHC: 31.6 g/dL (ref 31.5–35.7)
MCV: 83 fL (ref 79–97)
Monocytes Absolute: 0.3 10*3/uL (ref 0.1–0.9)
Monocytes: 8 %
Neutrophils Absolute: 2.3 10*3/uL (ref 1.4–7.0)
Neutrophils: 57 %
Platelets: 202 10*3/uL (ref 150–450)
RBC: 4.25 x10E6/uL (ref 4.14–5.80)
RDW: 15.2 % (ref 11.6–15.4)
WBC: 4 10*3/uL (ref 3.4–10.8)

## 2021-01-20 LAB — CMP14+EGFR
ALT: 7 IU/L (ref 0–44)
AST: 12 IU/L (ref 0–40)
Albumin/Globulin Ratio: 1 — ABNORMAL LOW (ref 1.2–2.2)
Albumin: 3.3 g/dL — ABNORMAL LOW (ref 3.5–4.6)
Alkaline Phosphatase: 164 IU/L — ABNORMAL HIGH (ref 44–121)
BUN/Creatinine Ratio: 24 (ref 10–24)
BUN: 29 mg/dL (ref 10–36)
Bilirubin Total: 0.2 mg/dL (ref 0.0–1.2)
CO2: 26 mmol/L (ref 20–29)
Calcium: 9.5 mg/dL (ref 8.6–10.2)
Chloride: 103 mmol/L (ref 96–106)
Creatinine, Ser: 1.19 mg/dL (ref 0.76–1.27)
Globulin, Total: 3.2 g/dL (ref 1.5–4.5)
Glucose: 80 mg/dL (ref 70–99)
Potassium: 4.3 mmol/L (ref 3.5–5.2)
Sodium: 144 mmol/L (ref 134–144)
Total Protein: 6.5 g/dL (ref 6.0–8.5)
eGFR: 56 mL/min/{1.73_m2} — ABNORMAL LOW (ref >59)

## 2021-01-20 LAB — VITAMIN B12: Vitamin B-12: 813 pg/mL (ref 232–1245)

## 2021-01-21 NOTE — Telephone Encounter (Signed)
RN  spoke to son . Information given. Son verbalized understanding / thanked Dr Percival Spanish and Nurse for getting back in touch

## 2021-01-21 NOTE — Telephone Encounter (Signed)
Spoke to son .  Information given.      Son states -his father ( the patient)  is an amputee and also has some incontinency issues. Patient also uses oxygen. Son states he did not know if Dr Percival Spanish was aware.    The flight  would be  2 hours vs 13 hours drive time.   Per son, If Dr Percival Spanish thinks driving is better he would do it  but he has bought tickets for flight and has 24 hours to change.  The son states he and the caregiver would be driving but the patient would be going by airplane if [place]..  Son aware will defer back to Dr Percival Spanish

## 2021-01-21 NOTE — Telephone Encounter (Signed)
Pt's son Syon Tews is calling back [phone]

## 2021-01-22 DIAGNOSIS — I5042 Chronic combined systolic (congestive) and diastolic (congestive) heart failure: Secondary | ICD-10-CM | POA: Diagnosis not present

## 2021-01-22 DIAGNOSIS — J189 Pneumonia, unspecified organism: Secondary | ICD-10-CM | POA: Diagnosis not present

## 2021-01-22 DIAGNOSIS — I110 Hypertensive heart disease with heart failure: Secondary | ICD-10-CM | POA: Diagnosis not present

## 2021-01-22 DIAGNOSIS — J9611 Chronic respiratory failure with hypoxia: Secondary | ICD-10-CM | POA: Diagnosis not present

## 2021-01-22 DIAGNOSIS — J440 Chronic obstructive pulmonary disease with acute lower respiratory infection: Secondary | ICD-10-CM | POA: Diagnosis not present

## 2021-01-22 DIAGNOSIS — N3000 Acute cystitis without hematuria: Secondary | ICD-10-CM | POA: Diagnosis not present

## 2021-01-23 DIAGNOSIS — I110 Hypertensive heart disease with heart failure: Secondary | ICD-10-CM | POA: Diagnosis not present

## 2021-01-23 DIAGNOSIS — J9611 Chronic respiratory failure with hypoxia: Secondary | ICD-10-CM | POA: Diagnosis not present

## 2021-01-23 DIAGNOSIS — I5042 Chronic combined systolic (congestive) and diastolic (congestive) heart failure: Secondary | ICD-10-CM | POA: Diagnosis not present

## 2021-01-23 DIAGNOSIS — J440 Chronic obstructive pulmonary disease with acute lower respiratory infection: Secondary | ICD-10-CM | POA: Diagnosis not present

## 2021-01-23 DIAGNOSIS — N3000 Acute cystitis without hematuria: Secondary | ICD-10-CM | POA: Diagnosis not present

## 2021-01-23 DIAGNOSIS — J189 Pneumonia, unspecified organism: Secondary | ICD-10-CM | POA: Diagnosis not present

## 2021-01-25 DIAGNOSIS — Z23 Encounter for immunization: Secondary | ICD-10-CM | POA: Diagnosis not present

## 2021-01-25 DIAGNOSIS — U071 COVID-19: Secondary | ICD-10-CM | POA: Diagnosis not present

## 2021-01-26 DIAGNOSIS — J189 Pneumonia, unspecified organism: Secondary | ICD-10-CM | POA: Diagnosis not present

## 2021-01-26 DIAGNOSIS — J440 Chronic obstructive pulmonary disease with acute lower respiratory infection: Secondary | ICD-10-CM | POA: Diagnosis not present

## 2021-01-26 DIAGNOSIS — J9611 Chronic respiratory failure with hypoxia: Secondary | ICD-10-CM | POA: Diagnosis not present

## 2021-01-26 DIAGNOSIS — I5042 Chronic combined systolic (congestive) and diastolic (congestive) heart failure: Secondary | ICD-10-CM | POA: Diagnosis not present

## 2021-01-26 DIAGNOSIS — N3000 Acute cystitis without hematuria: Secondary | ICD-10-CM | POA: Diagnosis not present

## 2021-01-26 DIAGNOSIS — I110 Hypertensive heart disease with heart failure: Secondary | ICD-10-CM | POA: Diagnosis not present

## 2021-01-27 ENCOUNTER — Other Ambulatory Visit: Payer: Self-pay | Admitting: Family Medicine

## 2021-01-27 DIAGNOSIS — I5042 Chronic combined systolic (congestive) and diastolic (congestive) heart failure: Secondary | ICD-10-CM | POA: Diagnosis not present

## 2021-01-27 DIAGNOSIS — J9611 Chronic respiratory failure with hypoxia: Secondary | ICD-10-CM | POA: Diagnosis not present

## 2021-01-27 DIAGNOSIS — I110 Hypertensive heart disease with heart failure: Secondary | ICD-10-CM | POA: Diagnosis not present

## 2021-01-27 DIAGNOSIS — J440 Chronic obstructive pulmonary disease with acute lower respiratory infection: Secondary | ICD-10-CM | POA: Diagnosis not present

## 2021-01-27 DIAGNOSIS — N3000 Acute cystitis without hematuria: Secondary | ICD-10-CM | POA: Diagnosis not present

## 2021-01-27 DIAGNOSIS — J189 Pneumonia, unspecified organism: Secondary | ICD-10-CM | POA: Diagnosis not present

## 2021-01-28 ENCOUNTER — Other Ambulatory Visit: Payer: Self-pay | Admitting: Family Medicine

## 2021-01-28 DIAGNOSIS — I5042 Chronic combined systolic (congestive) and diastolic (congestive) heart failure: Secondary | ICD-10-CM | POA: Diagnosis not present

## 2021-01-28 DIAGNOSIS — J440 Chronic obstructive pulmonary disease with acute lower respiratory infection: Secondary | ICD-10-CM | POA: Diagnosis not present

## 2021-01-28 DIAGNOSIS — I110 Hypertensive heart disease with heart failure: Secondary | ICD-10-CM | POA: Diagnosis not present

## 2021-01-28 DIAGNOSIS — N3000 Acute cystitis without hematuria: Secondary | ICD-10-CM | POA: Diagnosis not present

## 2021-01-28 DIAGNOSIS — J9611 Chronic respiratory failure with hypoxia: Secondary | ICD-10-CM | POA: Diagnosis not present

## 2021-01-28 DIAGNOSIS — J189 Pneumonia, unspecified organism: Secondary | ICD-10-CM | POA: Diagnosis not present

## 2021-01-29 DIAGNOSIS — J9611 Chronic respiratory failure with hypoxia: Secondary | ICD-10-CM | POA: Diagnosis not present

## 2021-01-29 DIAGNOSIS — N3000 Acute cystitis without hematuria: Secondary | ICD-10-CM | POA: Diagnosis not present

## 2021-01-29 DIAGNOSIS — J189 Pneumonia, unspecified organism: Secondary | ICD-10-CM | POA: Diagnosis not present

## 2021-01-29 DIAGNOSIS — J440 Chronic obstructive pulmonary disease with acute lower respiratory infection: Secondary | ICD-10-CM | POA: Diagnosis not present

## 2021-01-29 DIAGNOSIS — I5042 Chronic combined systolic (congestive) and diastolic (congestive) heart failure: Secondary | ICD-10-CM | POA: Diagnosis not present

## 2021-01-29 DIAGNOSIS — I110 Hypertensive heart disease with heart failure: Secondary | ICD-10-CM | POA: Diagnosis not present

## 2021-02-02 DIAGNOSIS — I110 Hypertensive heart disease with heart failure: Secondary | ICD-10-CM | POA: Diagnosis not present

## 2021-02-02 DIAGNOSIS — I5042 Chronic combined systolic (congestive) and diastolic (congestive) heart failure: Secondary | ICD-10-CM | POA: Diagnosis not present

## 2021-02-02 DIAGNOSIS — J189 Pneumonia, unspecified organism: Secondary | ICD-10-CM | POA: Diagnosis not present

## 2021-02-02 DIAGNOSIS — J440 Chronic obstructive pulmonary disease with acute lower respiratory infection: Secondary | ICD-10-CM | POA: Diagnosis not present

## 2021-02-02 DIAGNOSIS — J9611 Chronic respiratory failure with hypoxia: Secondary | ICD-10-CM | POA: Diagnosis not present

## 2021-02-02 DIAGNOSIS — N3000 Acute cystitis without hematuria: Secondary | ICD-10-CM | POA: Diagnosis not present

## 2021-02-03 ENCOUNTER — Telehealth: Payer: Self-pay

## 2021-02-03 ENCOUNTER — Ambulatory Visit (INDEPENDENT_AMBULATORY_CARE_PROVIDER_SITE_OTHER): Payer: Medicare Other

## 2021-02-03 VITALS — Ht 66.0 in | Wt 160.0 lb

## 2021-02-03 DIAGNOSIS — Z599 Problem related to housing and economic circumstances, unspecified: Secondary | ICD-10-CM | POA: Diagnosis not present

## 2021-02-03 DIAGNOSIS — Z0000 Encounter for general adult medical examination without abnormal findings: Secondary | ICD-10-CM

## 2021-02-03 NOTE — Patient Instructions (Signed)
Scott [name] , Thank you for taking time to come for your Medicare Wellness Visit. I appreciate your ongoing commitment to your health goals. Please review the following plan we discussed and let me know if I can assist you in the future.   Screening recommendations/referrals: Colonoscopy: No longer required Recommended yearly ophthalmology/optometry visit for glaucoma screening and checkup Recommended yearly dental visit for hygiene and checkup  Vaccinations: Influenza vaccine: Done 01/19/2021 - Repeat annually Pneumococcal vaccine: Done 1996 & 05/17/2013 Tdap vaccine: Done 2006 - Repeat in 10 years *due Shingles vaccine: Done 01/10/2020 - due for second dose   Covid-19: Done 05/18/19, 06/15/19, 07/28/2020, & 01/25/2021  Advanced directives: in chart  Conditions/risks identified: Aim for some type of stretching and exercising each day, drink 6-8 glasses of water and eat lots of fruits and vegetables.   Next appointment: Follow up in one year for your annual wellness visit.   Preventive Care 85 Years and Older, Male  Preventive care refers to lifestyle choices and visits with your health care provider that can promote health and wellness. What does preventive care include? A yearly physical exam. This is also called an annual well check. Dental exams once or twice a year. Routine eye exams. Ask your health care provider how often you should have your eyes checked. Personal lifestyle choices, including: Daily care of your teeth and gums. Regular physical activity. Eating a healthy diet. Avoiding tobacco and drug use. Limiting alcohol use. Practicing safe sex. Taking low doses of aspirin every day. Taking vitamin and mineral supplements as recommended by your health care provider. What happens during an annual well check? The services and screenings done by your health care provider during your annual well check will depend on your age, overall health, lifestyle risk factors, and family  history of disease. Counseling  Your health care provider may ask you questions about your: Alcohol use. Tobacco use. Drug use. Emotional well-being. Home and relationship well-being. Sexual activity. Eating habits. History of falls. Memory and ability to understand (cognition). Work and work Statistician. Screening  You may have the following tests or measurements: Height, weight, and BMI. Blood pressure. Lipid and cholesterol levels. These may be checked every 5 years, or more frequently if you are over 85 years old. Skin check. Lung cancer screening. You may have this screening every year starting at age 85 if you have a 30-pack-year history of smoking and currently smoke or have quit within the past 15 years. Fecal occult blood test (FOBT) of the stool. You may have this test every year starting at age 85. Flexible sigmoidoscopy or colonoscopy. You may have a sigmoidoscopy every 5 years or a colonoscopy every 10 years starting at age 85. Prostate cancer screening. Recommendations will vary depending on your family history and other risks. Hepatitis C blood test. Hepatitis B blood test. Sexually transmitted disease (STD) testing. Diabetes screening. This is done by checking your blood sugar (glucose) after you have not eaten for a while (fasting). You may have this done every 1-3 years. Abdominal aortic aneurysm (AAA) screening. You may need this if you are a current or former smoker. Osteoporosis. You may be screened starting at age 85 if you are at high risk. Talk with your health care provider about your test results, treatment options, and if necessary, the need for more tests. Vaccines  Your health care provider may recommend certain vaccines, such as: Influenza vaccine. This is recommended every year. Tetanus, diphtheria, and acellular pertussis (Tdap, Td) vaccine. You may  need a Td booster every 10 years. Zoster vaccine. You may need this after age 85. Pneumococcal  13-valent conjugate (PCV13) vaccine. One dose is recommended after age 85. Pneumococcal polysaccharide (PPSV23) vaccine. One dose is recommended after age 85. Talk to your health care provider about which screenings and vaccines you need and how often you need them. This information is not intended to replace advice given to you by your health care provider. Make sure you discuss any questions you have with your health care provider. Document Released: 04/25/2015 Document Revised: 12/17/2015 Document Reviewed: 01/28/2015 Elsevier Interactive Patient Education  2017 [place] Prevention in the Home Falls can cause injuries. They can happen to people of all ages. There are many things you can do to make your home safe and to help prevent falls. What can I do on the outside of my home? Regularly fix the edges of walkways and driveways and fix any cracks. Remove anything that might make you trip as you walk through a door, such as a raised step or threshold. Trim any bushes or trees on the path to your home. Use bright outdoor lighting. Clear any walking paths of anything that might make someone trip, such as rocks or tools. Regularly check to see if handrails are loose or broken. Make sure that both sides of any steps have handrails. Any raised decks and porches should have guardrails on the edges. Have any leaves, snow, or ice cleared regularly. Use sand or salt on walking paths during winter. Clean up any spills in your garage right away. This includes oil or grease spills. What can I do in the bathroom? Use night lights. Install grab bars by the toilet and in the tub and shower. Do not use towel bars as grab bars. Use non-skid mats or decals in the tub or shower. If you need to sit down in the shower, use a plastic, non-slip stool. Keep the floor dry. Clean up any water that spills on the floor as soon as it happens. Remove soap buildup in the tub or shower regularly. Attach  bath mats securely with double-sided non-slip rug tape. Do not have throw rugs and other things on the floor that can make you trip. What can I do in the bedroom? Use night lights. Make sure that you have a light by your bed that is easy to reach. Do not use any sheets or blankets that are too big for your bed. They should not hang down onto the floor. Have a firm chair that has side arms. You can use this for support while you get dressed. Do not have throw rugs and other things on the floor that can make you trip. What can I do in the kitchen? Clean up any spills right away. Avoid walking on wet floors. Keep items that you use a lot in easy-to-reach places. If you need to reach something above you, use a strong step stool that has a grab bar. Keep electrical cords out of the way. Do not use floor polish or wax that makes floors slippery. If you must use wax, use non-skid floor wax. Do not have throw rugs and other things on the floor that can make you trip. What can I do with my stairs? Do not leave any items on the stairs. Make sure that there are handrails on both sides of the stairs and use them. Fix handrails that are broken or loose. Make sure that handrails are as long as the stairways.  Check any carpeting to make sure that it is firmly attached to the stairs. Fix any carpet that is loose or worn. Avoid having throw rugs at the top or bottom of the stairs. If you do have throw rugs, attach them to the floor with carpet tape. Make sure that you have a light switch at the top of the stairs and the bottom of the stairs. If you do not have them, ask someone to add them for you. What else can I do to help prevent falls? Wear shoes that: Do not have high heels. Have rubber bottoms. Are comfortable and fit you well. Are closed at the toe. Do not wear sandals. If you use a stepladder: Make sure that it is fully opened. Do not climb a closed stepladder. Make sure that both sides of the  stepladder are locked into place. Ask someone to hold it for you, if possible. Clearly mark and make sure that you can see: Any grab bars or handrails. First and last steps. Where the edge of each step is. Use tools that help you move around (mobility aids) if they are needed. These include: Canes. Walkers. Scooters. Crutches. Turn on the lights when you go into a dark area. Replace any light bulbs as soon as they burn out. Set up your furniture so you have a clear path. Avoid moving your furniture around. If any of your floors are uneven, fix them. If there are any pets around you, be aware of where they are. Review your medicines with your doctor. Some medicines can make you feel dizzy. This can increase your chance of falling. Ask your doctor what other things that you can do to help prevent falls. This information is not intended to replace advice given to you by your health care provider. Make sure you discuss any questions you have with your health care provider. Document Released: 01/23/2009 Document Revised: 09/04/2015 Document Reviewed: 05/03/2014 Elsevier Interactive Patient Education  2017 [employer].

## 2021-02-03 NOTE — Chronic Care Management (AMB) (Signed)
  Chronic Care Management   Note  02/03/2021 Name: OZIL STETTLER MRN: [id number] DOB: 28-May-1921  [name] Djordjevic is a 87 y.o. year old male who is a primary care patient of Dettinger, Fransisca Kaufmann, MD. I reached out to [employer] by phone today in response to a referral sent by Mr. Johntay Doolen Lamba's PCP.  Mr. Hershman was given information about Chronic Care Management services today including:  CCM service includes personalized support from designated clinical staff supervised by his physician, including individualized plan of care and coordination with other care providers 24/7 contact phone numbers for assistance for urgent and routine care needs. Service will only be billed when office clinical staff spend 20 minutes or more in a month to coordinate care. Only one practitioner may furnish and bill the service in a calendar month. The patient may stop CCM services at any time (effective at the end of the month) by phone call to the office staff. The patient is responsible for co-pay (up to 20% after annual deductible is met) if co-pay is required by the individual health plan.   Patient agreed to services and verbal consent obtained.   Follow up plan: Telephone appointment with care management team member scheduled for:02/27/2021  Noreene Larsson, [place], [place], [place] [address] Direct Dial: [phone] [email or web address] Website: [email or web address]

## 2021-02-03 NOTE — Progress Notes (Signed)
Subjective:   Scott [name] is a 87 y.o. male who presents for Medicare Annual/Subsequent preventive examination.  Virtual Visit via Telephone Note  I connected with  Scott [name] on 02/03/21 at 10:30 AM EDT by telephone and verified that I am speaking with the correct person using two identifiers.  Location: Patient: Home Provider: [facility] Persons participating in the virtual visit: patient/son Scott [name]/ Caregiver [place]   I discussed the limitations, risks, security and privacy concerns of performing an evaluation and management service by telephone and the availability of in person appointments. The patient expressed understanding and agreed to proceed.  Interactive audio and video telecommunications were attempted between this nurse and patient, however failed, due to patient having technical difficulties OR patient did not have access to video capability.  We continued and completed visit with audio only.  Some vital signs may be absent or patient reported.   Scott Cerro [name], LPN   Review of Systems     Cardiac Risk Factors include: advanced age (>66men, >37 women);dyslipidemia;hypertension;male gender;sedentary lifestyle;smoking/ tobacco exposure;Other (see comment), Risk factor comments: bladder cancer, chronic respiratory failure, atherosclerosis, A.Fib, hx MI     Objective:    Today's Vitals   02/03/21 1028  Weight: 160 lb (72.6 kg)  Height: 5\' 6"  (1.676 m)   Body mass index is 25.82 kg/m.  Advanced Directives 02/03/2021 09/29/2020 08/16/2020 05/18/2020 03/04/2020 03/04/2020 02/17/2020  Does Patient Have a Medical Advance Directive? Yes Yes Yes Yes Yes Yes Yes  Type of Paramedic of [place];Living will Out of facility DNR (pink MOST or yellow form) [place];Living will;Out of facility DNR (pink MOST or yellow form) Out of facility DNR (pink MOST or yellow form) Living will - -  Does patient want to make  changes to medical advance directive? - - Yes (ED - Information included in AVS) - No - Patient declined - -  Copy of [place] in Chart? Yes - validated most recent copy scanned in chart (See row information) - - - - - -  Would patient like information on creating a medical advance directive? - - - - - - -  Pre-existing out of facility DNR order (yellow form or pink MOST form) - - - - - - -    Current Medications (verified) Outpatient Encounter Medications as of 02/03/2021  Medication Sig   acetaminophen (TYLENOL) 500 MG tablet Take 1,000 mg by mouth every 6 (six) hours as needed for moderate pain or headache.   albuterol (PROVENTIL) (2.5 MG/3ML) 0.083% nebulizer solution Take 3 mLs (2.5 mg total) by nebulization every 6 (six) hours as needed for wheezing or shortness of breath.   albuterol (VENTOLIN HFA) 108 (90 Base) MCG/ACT inhaler Inhale 2 puffs into the lungs every 6 (six) hours as needed for wheezing or shortness of breath.   aspirin EC 81 MG EC tablet Take 1 tablet (81 mg total) by mouth daily. Swallow whole.   carvedilol (COREG) 6.25 MG tablet Take 1 tablet (6.25 mg total) by mouth 2 (two) times daily with a meal.   donepezil (ARICEPT) 10 MG tablet Take 1 tablet (10 mg total) by mouth at bedtime.   ELIQUIS 2.5 MG TABS tablet Take 2.5 mg by mouth 2 (two) times daily.   febuxostat (ULORIC) 40 MG tablet Take 1 tablet (40 mg total) by mouth daily.   fluticasone (FLONASE) 50 MCG/ACT nasal spray Place 1 spray into both nostrils 2 (two) times daily as  needed for allergies or rhinitis. SPRAY 1 SPRAY IN EACH NOSTRIL ONCE DAILY.   furosemide (LASIX) 20 MG tablet TAKE 1 TABLET DAILY AS NEEDED (signs of volume overload).   gabapentin (NEURONTIN) 400 MG capsule Take 1 capsule (400 mg total) by mouth 2 (two) times daily.   Ipratropium-Albuterol (COMBIVENT RESPIMAT) 20-100 MCG/ACT AERS respimat Inhale 1 puff into the lungs every 6 (six) hours as needed for wheezing or shortness of  breath (As needed for wheezing , cough or shortness of breath).   levETIRAcetam (KEPPRA) 250 MG tablet TAKE ONE TABLET TWICE DAILY   levofloxacin (LEVAQUIN) 500 MG tablet Take 1 tablet by mouth daily.   losartan (COZAAR) 50 MG tablet TAKE ONE TABLET ONCE DAILY   MYRBETRIQ 25 MG TB24 tablet Take 1 tablet (25 mg total) by mouth daily.   pantoprazole (PROTONIX) 40 MG tablet Take 1 tablet (40 mg total) by mouth daily.   sertraline (ZOLOFT) 50 MG tablet Take 1 tablet (50 mg total) by mouth daily.   simvastatin (ZOCOR) 10 MG tablet Take 1 tablet (10 mg total) by mouth daily at 6 PM.   tamsulosin (FLOMAX) 0.4 MG CAPS capsule TAKE 1 CAPSULE DAILY   No facility-administered encounter medications on file as of 02/03/2021.    Allergies (verified) Atorvastatin and Rocephin [ceftriaxone sodium in dextrose]   History: Past Medical History:  Diagnosis Date   Arthritis    "all over"   Atrial fibrillation (HCC)    Benign localized prostatic hyperplasia with lower urinary tract symptoms (LUTS)    Bradycardia 11/28/2015   Severe-HR in the 20s to 30s following intubation for urological procedure.   CAD- non obstructive disease by cath 3/14 cardiologist-  dr hochrein   Chronic lower back pain    COPD (chronic obstructive pulmonary disease) ([place])    DDD (degenerative disc disease)    Depression    Dyspnea    w/ exertion and lying down (raise head)   ETOH abuse    GERD (gastroesophageal reflux disease)    Glaucoma    Glaucoma, both eyes    History of bladder cancer urologist-- dr Jeffie Pollock   first dx 2012--  s/p TURBT's   History of DVT of lower extremity 03/2013   bilateral    History of pulmonary embolus (PE) 04/2013   HTN (hypertension)    Hyperlipidemia    Hypertension    LBBB (left bundle branch block)    chronic   Non-ischemic cardiomyopathy ([place])    last echo 01-18-2017, ef 35-40%   NSVT (nonsustained ventricular tachycardia)    a. NSVT 06/2012; NSVT also seen during 03/2013 adm. b. Med  rx. Not candidate for ICD given adv age.   PAD (peripheral artery disease) (HCC)    Popliteal artery aneurysm, bilateral (HCC)    DOCUMENTED CHRONIC PARTIAL OCCLUSION--  PT DENIES CLAUDICATION OR ANY OTHER SYMPTOMS   PVCs (premature ventricular contractions)    PVD (peripheral vascular disease) (HCC)    Right ABI .75, Left .78 (2006)   Syncope 06/2012   a. Felt to be postural syncope related to diuretics 06/2012.   Systolic and diastolic CHF, chronic (HCC) cardiologsit-  dr hochrein   a. NICM - patent cors 06/2012, EF 40% at that time. b. 03/2013 eval: EF 20-25%.   Vision loss, left eye    due to glaucoma   Wears glasses    Past Surgical History:  Procedure Laterality Date   AMPUTATION Left 10/16/2014   Procedure: AMPUTATION ABOVE KNEE- LEFT;  Surgeon: Mal Misty,  MD;  Location: [facility];  Service: Vascular;  Laterality: Left;   CARDIAC CATHETERIZATION  05-11-2004  DR Gillett PLAQUE/ NORMAL LVF/ EF 55%/  NON-OBSTRUCTIVE LAD 25%   CARDIAC CATHETERIZATION  06/18/2012   dr hochrein   mild luminal irregularities of coronaries, ef 45-50%   CARDIOVASCULAR STRESS TEST  10-15-2010   LOW RISK NUCLEAR STUDY/ NO EVIDENCE OF ISCHEMIA/ NORMAL EF   CATARACT EXTRACTION W/ INTRAOCULAR LENS  IMPLANT, BILATERAL Bilateral    CYSTOSCOPY  10/07/2011   Procedure: CYSTOSCOPY;  Surgeon: Malka So, MD;  Location[facility];  Service: Urology;  Laterality: N/A;   CYSTOSCOPY N/A 10/20/2018   Procedure: CYSTOSCOPY WITH FULGURATION OF PROSTATIC URETHRAL TUMOR;  Surgeon: [name], MD;  Location: [facility];  Service: Urology;  Laterality: N/A;   CYSTOSCOPY N/A 10/11/2019   Procedure: CYSTOSCOPY;  Surgeon: Cleon Gustin, MD;  Location: [facility];  Service: Urology;  Laterality: N/A;   CYSTOSCOPY W/ RETROGRADES Bilateral 03/04/2020   Procedure: CYSTOSCOPY WITH BILATERAL RETROGRADE;  Surgeon: [name], MD;  Location: [facility];  Service: Urology;  Laterality: Bilateral;    CYSTOSCOPY WITH BIOPSY  03/14/2012   Procedure: CYSTOSCOPY WITH BIOPSY;  Surgeon: Malka So, MD;  Location: [facility];  Service: Urology;  Laterality: N/A;  WITH FULGURATION   CYSTOSCOPY WITH BIOPSY N/A 12/09/2016   Procedure: CYSTOSCOPY WITH BIOPSY AND FULGURATION;  Surgeon: [name], MD;  Location: [facility];  Service: Urology;  Laterality: N/A;   CYSTOSCOPY WITH BIOPSY N/A 12/20/2017   Procedure: CYSTOSCOPY WITH BIOPSY WITH FULGURATION;  Surgeon: [name], MD;  Location: [facility];  Service: Urology;  Laterality: N/A;   CYSTOSCOPY WITH BIOPSY N/A 06/09/2018   Procedure: CYSTOSCOPY WITH BIOPSY AND FULGURATION;  Surgeon: [name], MD;  Location: [facility];  Service: Urology;  Laterality: N/A;   CYSTOSCOPY WITH FULGERATION N/A 01/20/2016   Procedure: CYSTOSCOPY, BIOPSY WITH FULGERATION OF URTHRAL TUMOR;  Surgeon: [name], MD;  Location[facility];  Service: Urology;  Laterality: N/A;   CYSTOSCOPY WITH FULGERATION N/A 06/01/2016   Procedure: CYSTOSCOPY WITH FULGERATION urethral tumors and bladder neck;  Surgeon: [name], MD;  Location: [facility];  Service: Urology;  Laterality: N/A;   SHOULDER SURGERY Left 1970's   TONSILLECTOMY     TRANSTHORACIC ECHOCARDIOGRAM  01-18-2017   dr hochrein   moderate LVH, ef 35-40%, diffuse hypokinesis/  trivial AR and MR/  mild TR   TRANSURETHRAL RESECTION OF BLADDER TUMOR  10/07/2011   Procedure: TRANSURETHRAL RESECTION OF BLADDER TUMOR (TURBT);  Surgeon: Malka So, MD;  Location: [facility];  Service: Urology;  Laterality: N/A;   TRANSURETHRAL RESECTION OF BLADDER TUMOR N/A 07/10/2015   Procedure: TRANSURETHRAL RESECTION OF BLADDER TUMOR (TURBT), CYSTOSCOPY ;  Surgeon: [name], MD;  [facility];  Service: Urology;  Laterality: N/A;   TRANSURETHRAL RESECTION OF BLADDER TUMOR N/A 10/11/2019   Procedure: TRANSURETHRAL RESECTION OF PROSTATIC URETHRAL TUMOR;  Surgeon: Cleon Gustin, MD;  Location: [facility];  Service: Urology;  Laterality: N/A;    TRANSURETHRAL RESECTION OF BLADDER TUMOR WITH MITOMYCIN-C N/A 03/04/2020   Procedure: TRANSURETHRAL RESECTION OF BLADDER TUMOR WITH GEMCITABINE;  Surgeon: [name], MD;  Location[facility];  Service: Urology;  Laterality: N/A;   TRANSURETHRAL RESECTION OF PROSTATE  03/14/2012   Procedure: TRANSURETHRAL RESECTION OF THE PROSTATE WITH GYRUS INSTRUMENTS;  Surgeon: Malka So, MD;  Location[facility];  Service: Urology;  Laterality: N/A;   Family History  Problem Relation Age of Onset   Hypertension Mother    Arthritis Mother    Lung cancer Sister    Deep vein thrombosis Father    Early death Brother    Social History   Socioeconomic History   Marital status: Widowed    Spouse name: Not on file   Number of children: 1   Years of education: Not on file   Highest education level: Not on file  Occupational History   Occupation: Retired  Tobacco Use   Smoking status: Former    Packs/day: 1.00    Years: 35.00    Pack years: 35.00    Types: Cigarettes    Start date: 04/12/1942    Quit date: 04/13/1975    Years since quitting: 45.8   Smokeless tobacco: Never  Vaping Use   Vaping Use: Never used  Substance and Sexual Activity   Alcohol use: Not Currently    Alcohol/week: 7.0 standard drinks    Types: 7 Cans of beer per week   Drug use: No   Sexual activity: Never  Other Topics Concern   Not on file  Social History Narrative   He lives alone, but has 24 hour care from caregivers and family   He has one grown son who lives in [place] and stops by daily - helps with bills, getting meds, etc   He is retired but worked for [employer] for years and lived in [place].   Social Determinants of Health   Financial Resource Strain: Medium Risk   Difficulty of Paying Living Expenses: Somewhat hard  Food Insecurity: No Food Insecurity   Worried About Charity fundraiser in the Last Year: Never true   Ran Out of Food in the Last Year: Never true  Transportation Needs: No Transportation  Needs   Lack of Transportation (Medical): No   Lack of Transportation (Non-Medical): No  Physical Activity: Inactive   Days of Exercise per Week: 0 days   Minutes of Exercise per Session: 0 min  Stress: No Stress Concern Present   Feeling of Stress : Not at all  Social Connections: Moderately Integrated   Frequency of Communication with Friends and Family: More than three times a week   Frequency of Social Gatherings with Friends and Family: More than three times a week   Attends Religious Services: 1 to 4 times per year   Active Member of [employer] or Organizations: Yes   Attends Archivist Meetings: 1 to 4 times per year   Marital Status: Widowed    Tobacco Counseling Counseling given: Not Answered   Clinical Intake:  Pre-visit preparation completed: Yes  Pain : No/denies pain     BMI - recorded: 25.82 Nutritional Status: BMI 25 -29 Overweight Nutritional Risks: None Diabetes: No  How often do you need to have someone help you when you read instructions, pamphlets, or other written materials from your doctor or pharmacy?: 4 - Often  Diabetic? No  Interpreter Needed?: No  Information entered by :: Jawanda Passey, LPN   Activities of Daily Living In your present state of health, do you have any difficulty performing the following activities: 02/03/2021 08/17/2020  Hearing? N -  Vision? N -  Difficulty concentrating or making decisions? Y -  Walking or climbing stairs? Y -  Dressing or bathing? Y -  Doing errands, shopping? Tempie Donning  Preparing Food and eating ? Y -  Using the Toilet? N -  In the past six months, have  you accidently leaked urine? Y -  Do you have problems with loss of bowel control? N -  Managing your Medications? Y -  Managing your Finances? Y -  Housekeeping or managing your Housekeeping? Y -  Some recent data might be hidden    Patient Care Team: Dettinger, Fransisca Kaufmann, MD as PCP - General (Family Medicine) Minus Breeding, MD as PCP -  Cardiology (Cardiology) Thompson Grayer, MD as Consulting Physician (Cardiology) Early, Arvilla Meres, MD as Consulting Physician (Vascular Surgery) Minus Breeding, MD as Consulting Physician (Cardiology) Josue Hector, MD as Consulting Physician (Cardiology)  Indicate any recent Medical Services you may have received from other than [facility] providers in the past year (date may be approximate).     Assessment:   This is a routine wellness examination for Tino.  Hearing/Vision screen Hearing Screening - Comments:: C/o mild hearing difficulties - declines hearing aids Vision Screening - Comments:: Wears rx glasses - behind on annual eye exams - no longer wants to see eye doctor  Dietary issues and exercise activities discussed: Current Exercise Habits: Home exercise routine, Type of exercise: stretching, Time (Minutes): 10, Frequency (Times/Week): 7, Weekly Exercise (Minutes/Week): 70, Intensity: Mild, Exercise limited by: orthopedic condition(s);respiratory conditions(s);cardiac condition(s)   Goals Addressed             This Visit's Progress    Patient Stated       Doesn't want memory to get worse - wants to be as independent as possible Would like to get med cost lowered (referral sent to CCM)       Depression Screen PHQ 2/9 Scores 02/03/2021 01/19/2021 08/29/2020 08/29/2020 07/21/2020 03/19/2020 01/10/2020  PHQ - 2 Score 1 2 2  0 0 0 0  PHQ- 9 Score 6 8 9  - - - -    Fall Risk Fall Risk  02/03/2021 01/19/2021 08/29/2020 07/21/2020 03/19/2020  Falls in the past year? 0 0 0 0 0  Number falls in past yr: 0 - - - -  Injury with Fall? 0 - - - -  Risk Factor Category  - - - - -  Risk for fall due to : Impaired balance/gait;Impaired mobility;Mental status change;Orthopedic patient - - - -  Follow up Education provided;Falls prevention discussed - - - -    FALL RISK PREVENTION PERTAINING TO THE HOME:  Any stairs in or around the home? No  If so, are there any without handrails? No  Home  free of loose throw rugs in walkways, pet beds, electrical cords, etc? Yes  Adequate lighting in your home to reduce risk of falls? Yes   ASSISTIVE DEVICES UTILIZED TO PREVENT FALLS:  Life alert? No  Use of a cane, walker or w/c? Yes  Grab bars in the bathroom? Yes  Shower chair or bench in shower? Yes  Elevated toilet seat or a handicapped toilet? Yes   TIMED UP AND GO:  Was the test performed? No . Telephonic visit  Cognitive Function: Cognitive status assessed by direct observation. Patient has current diagnosis of cognitive impairment. Patient is followed by neurology for ongoing assessment. Patient is unable to complete screening 6CIT or MMSE.    MMSE - Mini Mental State Exam 02/03/2021 06/03/2014  Not completed: Unable to complete -  Orientation to time - 5  Orientation to Place - 4  Registration - 3  Attention/ Calculation - 4  Recall - 2  Language- name 2 objects - 2  Language- repeat - 1  Language- follow 3 step command -  3  Language- read & follow direction - 1  Write a sentence - 1  Copy design - 0  Total score - 26        Immunizations Immunization History  Administered Date(s) Administered   Fluad Quad(high Dose 65+) 01/10/2020, 01/19/2021   Influenza, High Dose Seasonal PF 01/19/2016, 03/28/2017, 02/13/2018   Influenza,inj,Quad PF,6+ Mos 02/12/2013, 01/31/2014, 01/14/2015   Influenza-Unspecified 01/25/2012   Moderna Sars-Covid-2 Vaccination 05/18/2019, 06/15/2019, 07/28/2020, 01/25/2021   Pneumococcal Conjugate-13 05/17/2013   Pneumococcal Polysaccharide-23 04/12/1994   Zoster Recombinat (Shingrix) 01/10/2020    TDAP status: Due, Education has been provided regarding the importance of this vaccine. Advised may receive this vaccine at local pharmacy or Health Dept. Aware to provide a copy of the vaccination record if obtained from local pharmacy or Health Dept. Verbalized acceptance and understanding.  Flu Vaccine status: Up to date  Pneumococcal  vaccine status: Up to date  Covid-19 vaccine status: Completed vaccines  Qualifies for Shingles Vaccine? Yes   Zostavax completed Yes   Shingrix Completed?: No.    Education has been provided regarding the importance of this vaccine. Patient has been advised to call insurance company to determine out of pocket expense if they have not yet received this vaccine. Advised may also receive vaccine at local pharmacy or Health Dept. Verbalized acceptance and understanding.  Screening Tests Health Maintenance  Topic Date Due   Zoster Vaccines- Shingrix (2 of 2) 04/21/2021 (Originally 03/06/2020)   TETANUS/TDAP  07/21/2021 (Originally 04/12/2014)   COVID-19 Vaccine (5 - Booster for Moderna series) 03/22/2021   Pneumonia Vaccine 55+ Years old  Completed   INFLUENZA VACCINE  Completed   HPV VACCINES  Aged Out    Health Maintenance  There are no preventive care reminders to display for this patient.  Colorectal cancer screening: No longer required.   Lung Cancer Screening: (Low Dose CT Chest recommended if Age 85-80 years, 30 pack-year currently smoking OR have quit w/in 15years.) does not qualify  Additional Screening:  Hepatitis C Screening: does not qualify  Vision Screening: Recommended annual ophthalmology exams for early detection of glaucoma and other disorders of the eye. Is the patient up to date with their annual eye exam?  No  Who is the provider or what is the name of the office in which the patient attends annual eye exams? none If pt is not established with a provider, would they like to be referred to a provider to establish care? No .   Dental Screening: Recommended annual dental exams for proper oral hygiene  Community Resource Referral / Chronic Care Management: CRR required this visit?  No   CCM required this visit?  Yes      Plan:     I have personally reviewed and noted the following in the patient's chart:   Medical and social history Use of alcohol,  tobacco or illicit drugs  Current medications and supplements including opioid prescriptions. Patient is not currently taking opioid prescriptions. Functional ability and status Nutritional status Physical activity Advanced directives List of other physicians Hospitalizations, surgeries, and ER visits in previous 12 months Vitals Screenings to include cognitive, depression, and falls Referrals and appointments  In addition, I have reviewed and discussed with patient certain preventive protocols, quality metrics, and best practice recommendations. A written personalized care plan for preventive services as well as general preventive health recommendations were provided to patient.     Sandrea Hammond, LPN   [id number]   Nurse Notes: referred to CCM for  medication assistance

## 2021-02-04 ENCOUNTER — Ambulatory Visit (INDEPENDENT_AMBULATORY_CARE_PROVIDER_SITE_OTHER): Payer: Medicare Other

## 2021-02-04 ENCOUNTER — Other Ambulatory Visit: Payer: Self-pay

## 2021-02-04 DIAGNOSIS — F419 Anxiety disorder, unspecified: Secondary | ICD-10-CM

## 2021-02-04 DIAGNOSIS — J189 Pneumonia, unspecified organism: Secondary | ICD-10-CM | POA: Diagnosis not present

## 2021-02-04 DIAGNOSIS — I5042 Chronic combined systolic (congestive) and diastolic (congestive) heart failure: Secondary | ICD-10-CM | POA: Diagnosis not present

## 2021-02-04 DIAGNOSIS — I739 Peripheral vascular disease, unspecified: Secondary | ICD-10-CM | POA: Diagnosis not present

## 2021-02-04 DIAGNOSIS — I2510 Atherosclerotic heart disease of native coronary artery without angina pectoris: Secondary | ICD-10-CM | POA: Diagnosis not present

## 2021-02-04 DIAGNOSIS — E785 Hyperlipidemia, unspecified: Secondary | ICD-10-CM

## 2021-02-04 DIAGNOSIS — I110 Hypertensive heart disease with heart failure: Secondary | ICD-10-CM | POA: Diagnosis not present

## 2021-02-04 DIAGNOSIS — I4891 Unspecified atrial fibrillation: Secondary | ICD-10-CM | POA: Diagnosis not present

## 2021-02-04 DIAGNOSIS — M47816 Spondylosis without myelopathy or radiculopathy, lumbar region: Secondary | ICD-10-CM

## 2021-02-04 DIAGNOSIS — J9611 Chronic respiratory failure with hypoxia: Secondary | ICD-10-CM | POA: Diagnosis not present

## 2021-02-04 DIAGNOSIS — F0390 Unspecified dementia without behavioral disturbance: Secondary | ICD-10-CM | POA: Diagnosis not present

## 2021-02-04 DIAGNOSIS — J440 Chronic obstructive pulmonary disease with acute lower respiratory infection: Secondary | ICD-10-CM

## 2021-02-04 DIAGNOSIS — N3000 Acute cystitis without hematuria: Secondary | ICD-10-CM | POA: Diagnosis not present

## 2021-02-04 DIAGNOSIS — M160 Bilateral primary osteoarthritis of hip: Secondary | ICD-10-CM

## 2021-02-04 DIAGNOSIS — F32A Depression, unspecified: Secondary | ICD-10-CM | POA: Diagnosis not present

## 2021-02-05 ENCOUNTER — Other Ambulatory Visit: Payer: Self-pay | Admitting: Family Medicine

## 2021-02-07 DIAGNOSIS — J440 Chronic obstructive pulmonary disease with acute lower respiratory infection: Secondary | ICD-10-CM | POA: Diagnosis not present

## 2021-02-07 DIAGNOSIS — F0390 Unspecified dementia without behavioral disturbance: Secondary | ICD-10-CM | POA: Diagnosis not present

## 2021-02-07 DIAGNOSIS — M160 Bilateral primary osteoarthritis of hip: Secondary | ICD-10-CM | POA: Diagnosis not present

## 2021-02-07 DIAGNOSIS — J189 Pneumonia, unspecified organism: Secondary | ICD-10-CM | POA: Diagnosis not present

## 2021-02-07 DIAGNOSIS — C679 Malignant neoplasm of bladder, unspecified: Secondary | ICD-10-CM | POA: Diagnosis not present

## 2021-02-07 DIAGNOSIS — J9611 Chronic respiratory failure with hypoxia: Secondary | ICD-10-CM | POA: Diagnosis not present

## 2021-02-07 DIAGNOSIS — I4891 Unspecified atrial fibrillation: Secondary | ICD-10-CM | POA: Diagnosis not present

## 2021-02-07 DIAGNOSIS — Z993 Dependence on wheelchair: Secondary | ICD-10-CM | POA: Diagnosis not present

## 2021-02-07 DIAGNOSIS — I110 Hypertensive heart disease with heart failure: Secondary | ICD-10-CM | POA: Diagnosis not present

## 2021-02-07 DIAGNOSIS — M47816 Spondylosis without myelopathy or radiculopathy, lumbar region: Secondary | ICD-10-CM | POA: Diagnosis not present

## 2021-02-07 DIAGNOSIS — F419 Anxiety disorder, unspecified: Secondary | ICD-10-CM | POA: Diagnosis not present

## 2021-02-07 DIAGNOSIS — I739 Peripheral vascular disease, unspecified: Secondary | ICD-10-CM | POA: Diagnosis not present

## 2021-02-07 DIAGNOSIS — Z86718 Personal history of other venous thrombosis and embolism: Secondary | ICD-10-CM | POA: Diagnosis not present

## 2021-02-07 DIAGNOSIS — Z9981 Dependence on supplemental oxygen: Secondary | ICD-10-CM | POA: Diagnosis not present

## 2021-02-07 DIAGNOSIS — E785 Hyperlipidemia, unspecified: Secondary | ICD-10-CM | POA: Diagnosis not present

## 2021-02-07 DIAGNOSIS — Z86711 Personal history of pulmonary embolism: Secondary | ICD-10-CM | POA: Diagnosis not present

## 2021-02-07 DIAGNOSIS — F32A Depression, unspecified: Secondary | ICD-10-CM | POA: Diagnosis not present

## 2021-02-07 DIAGNOSIS — I2510 Atherosclerotic heart disease of native coronary artery without angina pectoris: Secondary | ICD-10-CM | POA: Diagnosis not present

## 2021-02-07 DIAGNOSIS — N3000 Acute cystitis without hematuria: Secondary | ICD-10-CM | POA: Diagnosis not present

## 2021-02-07 DIAGNOSIS — Z89612 Acquired absence of left leg above knee: Secondary | ICD-10-CM | POA: Diagnosis not present

## 2021-02-07 DIAGNOSIS — I5042 Chronic combined systolic (congestive) and diastolic (congestive) heart failure: Secondary | ICD-10-CM | POA: Diagnosis not present

## 2021-02-10 DIAGNOSIS — I110 Hypertensive heart disease with heart failure: Secondary | ICD-10-CM | POA: Diagnosis not present

## 2021-02-10 DIAGNOSIS — N3000 Acute cystitis without hematuria: Secondary | ICD-10-CM | POA: Diagnosis not present

## 2021-02-10 DIAGNOSIS — J9611 Chronic respiratory failure with hypoxia: Secondary | ICD-10-CM | POA: Diagnosis not present

## 2021-02-10 DIAGNOSIS — J440 Chronic obstructive pulmonary disease with acute lower respiratory infection: Secondary | ICD-10-CM | POA: Diagnosis not present

## 2021-02-10 DIAGNOSIS — J189 Pneumonia, unspecified organism: Secondary | ICD-10-CM | POA: Diagnosis not present

## 2021-02-10 DIAGNOSIS — I5042 Chronic combined systolic (congestive) and diastolic (congestive) heart failure: Secondary | ICD-10-CM | POA: Diagnosis not present

## 2021-02-12 ENCOUNTER — Ambulatory Visit (INDEPENDENT_AMBULATORY_CARE_PROVIDER_SITE_OTHER): Payer: Medicare Other | Admitting: Urology

## 2021-02-12 ENCOUNTER — Other Ambulatory Visit: Payer: Self-pay

## 2021-02-12 VITALS — BP 89/49 | HR 59

## 2021-02-12 DIAGNOSIS — I70248 Atherosclerosis of native arteries of left leg with ulceration of other part of lower left leg: Secondary | ICD-10-CM | POA: Diagnosis not present

## 2021-02-12 DIAGNOSIS — I70238 Atherosclerosis of native arteries of right leg with ulceration of other part of lower right leg: Secondary | ICD-10-CM | POA: Diagnosis not present

## 2021-02-12 DIAGNOSIS — C680 Malignant neoplasm of urethra: Secondary | ICD-10-CM | POA: Diagnosis not present

## 2021-02-12 DIAGNOSIS — R310 Gross hematuria: Secondary | ICD-10-CM | POA: Diagnosis not present

## 2021-02-12 DIAGNOSIS — N3941 Urge incontinence: Secondary | ICD-10-CM | POA: Diagnosis not present

## 2021-02-12 DIAGNOSIS — C678 Malignant neoplasm of overlapping sites of bladder: Secondary | ICD-10-CM | POA: Diagnosis not present

## 2021-02-12 DIAGNOSIS — Z87440 Personal history of urinary (tract) infections: Secondary | ICD-10-CM | POA: Diagnosis not present

## 2021-02-12 NOTE — Progress Notes (Signed)
Urological Symptom Review  Patient is experiencing the following symptoms: Frequent urination Hard to postpone urination Get up at night to urinate Leakage of urine Stream starts and stops Trouble starting stream Blood in urine Erection problems (male only)   Review of Systems  Gastrointestinal (upper)  : Negative for upper GI symptoms  Gastrointestinal (lower) : Negative for lower GI symptoms  Constitutional : Negative for symptoms  Skin: Negative for skin symptoms  Eyes: Negative for eye symptoms  Ear/Nose/Throat : Negative for Ear/Nose/Throat symptoms  Hematologic/Lymphatic: Negative for Hematologic/Lymphatic symptoms  Cardiovascular : Negative for cardiovascular symptoms  Respiratory : Negative for respiratory symptoms  Endocrine: Negative for endocrine symptoms  Musculoskeletal: Negative for musculoskeletal symptoms  Neurological: Negative for neurological symptoms  Psychologic: Depression or anxiety

## 2021-02-12 NOTE — Progress Notes (Signed)
Subjective:  1. Malignant neoplasm of overlapping sites of bladder ([place])   2. Malignant neoplasm of urethra ([place])   3. Gross hematuria   4. Personal history of urinary infection   5. Urge incontinence    02/12/21: Scott [name] returns today in f/u for his history of bladder and prostatic urethral cancer with gross hematuria.  His Hgb was 11.1 on 01/19/21.  It was 11.6 on 11/13/20.  His Cr was 1.19 on 01/19/21.  He had an E. Coli UTI on 01/05/21 with MS changes and was admitted at [facility]  He was found to have pneumonia as well and some hip pain that turned out to be arthritis.  He has continued light hematuria.  He has incontinence that is managed with diapers.  He remains on Myrbetriq and tamsulosin.   He is back on the Eliquis but hasn't had worsening hematuria.   His overall condition has declined with low energy and sleeping most of the day.    8/4/22Jeneen [name] returns in f/u for the history noted below.   He has had some intermittent hematuria. He is off of the Eliquis.  He is not having difficulty voiding but has some urgency with incontinence.   His UA today has >30 RBC's and is grossly bloody.  He has been started on tamsulosin on 10/23/20 by Dr. Warrick Parisian.   He was also on Myrbetriq.  He is having some headaches but no other pains.  He is sleeping more.    10/02/20: Scott [name] returns today in f/u.  He was seen last month and was found to have recurrent tumors in the prostate and bladder.  He had bleeding over the weekend and went to the ER and is back with a foley with deep pink urine with some clots intermittently.  The catheter has been draining and it is a 28f.  He remains on Eliquis.  A culture on 6/20 was negative.  His Hgb was 11.1 which is slight decline from 11.7 on 08/29/20.   He has no pain and is tolerating the foley.   GU hx: JDatrellreturns today in f/u for his history of urothelial cancer.  He last had a TURBT of recurrent disease on 03/04/20 and was found to have multfocal HG NMIBC of the urethra,  prostatic urethra and multiple bladder sites.  Retrograde pyelography was normal.  He had a resection in 7/21 and he had only low grade disease at this time.   He is doing well without significant complaints and his urine today ahs 6-10 WBC and 3-10 RBC's.    He has urgency and can have UUI.   He remains on Myrbetriq.  He has a history of UTI's but is not on treatment now.    He has no pain.        ROS:  ROS:  A complete review of systems was performed.  All systems are negative except for pertinent findings as noted.   ROS  Allergies  Allergen Reactions   Atorvastatin Hives and Rash   Rocephin [Ceftriaxone Sodium In Dextrose] Other (See Comments)    Recurrent seizures    Outpatient Encounter Medications as of 02/12/2021  Medication Sig   acetaminophen (TYLENOL) 500 MG tablet Take 1,000 mg by mouth every 6 (six) hours as needed for moderate pain or headache.   albuterol (PROVENTIL) (2.5 MG/3ML) 0.083% nebulizer solution Take 3 mLs (2.5 mg total) by nebulization every 6 (six) hours as needed for wheezing or shortness of breath.   albuterol (VENTOLIN HFA) 108 (  90 Base) MCG/ACT inhaler Inhale 2 puffs into the lungs every 6 (six) hours as needed for wheezing or shortness of breath.   aspirin EC 81 MG EC tablet Take 1 tablet (81 mg total) by mouth daily. Swallow whole.   carvedilol (COREG) 6.25 MG tablet Take 1 tablet (6.25 mg total) by mouth 2 (two) times daily with a meal.   donepezil (ARICEPT) 10 MG tablet Take 1 tablet (10 mg total) by mouth at bedtime.   ELIQUIS 2.5 MG TABS tablet Take 2.5 mg by mouth 2 (two) times daily.   febuxostat (ULORIC) 40 MG tablet Take 1 tablet (40 mg total) by mouth daily.   fluticasone (FLONASE) 50 MCG/ACT nasal spray Place 1 spray into both nostrils 2 (two) times daily as needed for allergies or rhinitis. SPRAY 1 SPRAY IN EACH NOSTRIL ONCE DAILY.   furosemide (LASIX) 20 MG tablet TAKE 1 TABLET DAILY AS NEEDED (signs of volume overload).   gabapentin  (NEURONTIN) 400 MG capsule TAKE 1 CAPSULE 2 TIMES A DAY   Ipratropium-Albuterol (COMBIVENT RESPIMAT) 20-100 MCG/ACT AERS respimat Inhale 1 puff into the lungs every 6 (six) hours as needed for wheezing or shortness of breath (As needed for wheezing , cough or shortness of breath).   levETIRAcetam (KEPPRA) 250 MG tablet TAKE ONE TABLET TWICE DAILY   levofloxacin (LEVAQUIN) 500 MG tablet Take 1 tablet by mouth daily.   losartan (COZAAR) 50 MG tablet TAKE ONE TABLET ONCE DAILY   MYRBETRIQ 25 MG TB24 tablet Take 1 tablet (25 mg total) by mouth daily.   pantoprazole (PROTONIX) 40 MG tablet Take 1 tablet (40 mg total) by mouth daily.   sertraline (ZOLOFT) 50 MG tablet Take 1 tablet (50 mg total) by mouth daily.   simvastatin (ZOCOR) 10 MG tablet Take 1 tablet (10 mg total) by mouth daily at 6 PM.   tamsulosin (FLOMAX) 0.4 MG CAPS capsule TAKE 1 CAPSULE DAILY   No facility-administered encounter medications on file as of 02/12/2021.    Past Medical History:  Diagnosis Date   Arthritis    "all over"   Atrial fibrillation ([place])    Benign localized prostatic hyperplasia with lower urinary tract symptoms (LUTS)    Bradycardia 11/28/2015   Severe-HR in the 20s to 30s following intubation for urological procedure.   CAD- non obstructive disease by cath 3/14 cardiologist-  dr hochrein   Chronic lower back pain    COPD (chronic obstructive pulmonary disease) ([place])    DDD (degenerative disc disease)    Depression    Dyspnea    w/ exertion and lying down (raise head)   ETOH abuse    GERD (gastroesophageal reflux disease)    Glaucoma    Glaucoma, both eyes    History of bladder cancer urologist-- dr Jeffie Pollock   first dx 2012--  s/p TURBT's   History of DVT of lower extremity 03/2013   bilateral    History of pulmonary embolus (PE) 04/2013   HTN (hypertension)    Hyperlipidemia    Hypertension    LBBB (left bundle branch block)    chronic   Non-ischemic cardiomyopathy ([place])    last echo  01-18-2017, ef 35-40%   NSVT (nonsustained ventricular tachycardia)    a. NSVT 06/2012; NSVT also seen during 03/2013 adm. b. Med rx. Not candidate for ICD given adv age.   PAD (peripheral artery disease) ([facility])    Popliteal artery aneurysm, bilateral ([facility])    DOCUMENTED CHRONIC PARTIAL OCCLUSION--  PT DENIES CLAUDICATION OR ANY  OTHER SYMPTOMS   PVCs (premature ventricular contractions)    PVD (peripheral vascular disease) ([facility])    Right ABI .75, Left .78 (2006)   Syncope 06/2012   a. Felt to be postural syncope related to diuretics 06/2012.   Systolic and diastolic CHF, chronic ([facility]CC) cardiologsit-  dr hochrein   a. NICM - patent cors 06/2012, EF 40% at that time. b. 03/2013 eval: EF 20-25%.   Vision loss, left eye    due to glaucoma   Wears glasses     Past Surgical History:  Procedure Laterality Date   AMPUTATION Left 10/16/2014   Procedure: AMPUTATION ABOVE KNEE- LEFT;  Surgeon: Mal Misty, MD;  Location: [place];  Service: Vascular;  Laterality: Left;   CARDIAC CATHETERIZATION  05-11-2004  DR [place] PLAQUE/ NORMAL LVF/ EF 55%/  NON-OBSTRUCTIVE LAD 25%   CARDIAC CATHETERIZATION  06/18/2012   dr hochrein   mild luminal irregularities of coronaries, ef 45-50%   CARDIOVASCULAR STRESS TEST  10-15-2010   LOW RISK NUCLEAR STUDY/ NO EVIDENCE OF ISCHEMIA/ NORMAL EF   CATARACT EXTRACTION W/ INTRAOCULAR LENS  IMPLANT, BILATERAL Bilateral    CYSTOSCOPY  10/07/2011   Procedure: CYSTOSCOPY;  Surgeon: Malka So, MD;  Location: [facility];  Service: Urology;  Laterality: N/A;   CYSTOSCOPY N/A 10/20/2018   Procedure: CYSTOSCOPY WITH FULGURATION OF PROSTATIC URETHRAL TUMOR;  Surgeon: [name], MD;  Location: [facility];  Service: Urology;  Laterality: N/A;   CYSTOSCOPY N/A 10/11/2019   Procedure: CYSTOSCOPY;  Surgeon: Cleon Gustin, MD;  Location: [facility];  Service: Urology;  Laterality: N/A;   CYSTOSCOPY W/ RETROGRADES Bilateral 03/04/2020   Procedure:  CYSTOSCOPY WITH BILATERAL RETROGRADE;  Surgeon: [name], MD;  Location: [facility];  Service: Urology;  Laterality: Bilateral;   CYSTOSCOPY WITH BIOPSY  03/14/2012   Procedure: CYSTOSCOPY WITH BIOPSY;  Surgeon: Malka So, MD;  Location: [facility];  Service: Urology;  Laterality: N/A;  WITH FULGURATION   CYSTOSCOPY WITH BIOPSY N/A 12/09/2016   Procedure: CYSTOSCOPY WITH BIOPSY AND FULGURATION;  Surgeon: [name], MD;  Location: [facility];  Service: Urology;  Laterality: N/A;   CYSTOSCOPY WITH BIOPSY N/A 12/20/2017   Procedure: CYSTOSCOPY WITH BIOPSY WITH FULGURATION;  Surgeon: [name], MD;  Location: [facility];  Service: Urology;  Laterality: N/A;   CYSTOSCOPY WITH BIOPSY N/A 06/09/2018   Procedure: CYSTOSCOPY WITH BIOPSY AND FULGURATION;  Surgeon: [name], MD;  Location: [facility];  Service: Urology;  Laterality: N/A;   CYSTOSCOPY WITH FULGERATION N/A 01/20/2016   Procedure: CYSTOSCOPY, BIOPSY WITH FULGERATION OF URTHRAL TUMOR;  Surgeon: [name], MD;  Location[facility];  Service: Urology;  Laterality: N/A;   CYSTOSCOPY WITH FULGERATION N/A 06/01/2016   Procedure: CYSTOSCOPY WITH FULGERATION urethral tumors and bladder neck;  Surgeon: [name], MD;  Location[facility];  Service: Urology;  Laterality: N/A;   SHOULDER SURGERY Left 1970's   TONSILLECTOMY     TRANSTHORACIC ECHOCARDIOGRAM  01-18-2017   dr hochrein   moderate LVH, ef 35-40%, diffuse hypokinesis/  trivial AR and MR/  mild TR   TRANSURETHRAL RESECTION OF BLADDER TUMOR  10/07/2011   Procedure: TRANSURETHRAL RESECTION OF BLADDER TUMOR (TURBT);  Surgeon: Malka So, MD;  Location: [facility];  Service: Urology;  Laterality: N/A;   TRANSURETHRAL RESECTION OF BLADDER TUMOR N/A 07/10/2015   Procedure: TRANSURETHRAL RESECTION OF BLADDER TUMOR (TURBT), CYSTOSCOPY ;  Surgeon: [name], MD;  [facility];  Service: Urology;  Laterality: N/A;   TRANSURETHRAL RESECTION OF BLADDER TUMOR N/A 10/11/2019   Procedure: TRANSURETHRAL  RESECTION OF PROSTATIC URETHRAL TUMOR;  Surgeon: Cleon Gustin, MD;  Location: [facility];  Service: Urology;  Laterality: N/A;   TRANSURETHRAL RESECTION OF BLADDER TUMOR WITH MITOMYCIN-C N/A 03/04/2020   Procedure: TRANSURETHRAL RESECTION OF BLADDER TUMOR WITH GEMCITABINE;  Surgeon: [name], MD;  [facility];  Service: Urology;  Laterality: N/A;   TRANSURETHRAL RESECTION OF PROSTATE  03/14/2012   Procedure: TRANSURETHRAL RESECTION OF THE PROSTATE WITH GYRUS INSTRUMENTS;  Surgeon: Malka So, MD;  Location[facility];  Service: Urology;  Laterality: N/A;    Social History   Socioeconomic History   Marital status: Widowed    Spouse name: Not on file   Number of children: 1   Years of education: Not on file   Highest education level: Not on file  Occupational History   Occupation: Retired  Tobacco Use   Smoking status: Former    Packs/day: 1.00    Years: 35.00    Pack years: 35.00    Types: Cigarettes    Start date: 04/12/1942    Quit date: 04/13/1975    Years since quitting: 45.8   Smokeless tobacco: Never  Vaping Use   Vaping Use: Never used  Substance and Sexual Activity   Alcohol use: Not Currently    Alcohol/week: 7.0 standard drinks    Types: 7 Cans of beer per week   Drug use: No   Sexual activity: Never  Other Topics Concern   Not on file  Social History Narrative   He lives alone, but has 24 hour care from caregivers and family   He has one grown son who lives in [place] and stops by daily - helps with bills, getting meds, etc   He is retired but worked for [employer] for years and lived in [place].   Social Determinants of Health   Financial Resource Strain: Medium Risk   Difficulty of Paying Living Expenses: Somewhat hard  Food Insecurity: No Food Insecurity   Worried About Charity fundraiser in the Last Year: Never true   Ran Out of Food in the Last Year: Never true  Transportation Needs: No Transportation Needs   Lack of Transportation  (Medical): No   Lack of Transportation (Non-Medical): No  Physical Activity: Inactive   Days of Exercise per Week: 0 days   Minutes of Exercise per Session: 0 min  Stress: No Stress Concern Present   Feeling of Stress : Not at all  Social Connections: Moderately Integrated   Frequency of Communication with Friends and Family: More than three times a week   Frequency of Social Gatherings with Friends and Family: More than three times a week   Attends Religious Services: 1 to 4 times per year   Active Member of [employer] or Organizations: Yes   Attends Archivist Meetings: 1 to 4 times per year   Marital Status: Widowed  Human resources officer Violence: Not At Risk   Fear of Current or Ex-Partner: No   Emotionally Abused: No   Physically Abused: No   Sexually Abused: No    Family History  Problem Relation Age of Onset   Hypertension Mother    Arthritis Mother    Lung cancer Sister    Deep vein thrombosis Father    Early death Brother        Objective: Vitals:   02/12/21 1405  BP: (!) 89/49  Pulse: (!) 59      Physical Exam  Lab Results:    Recent Results (from the past 2160 hour(s))  CBC with Differential/Platelet     Status: Abnormal   Collection Time: 01/19/21  2:31 PM  Result Value Ref Range   WBC 4.0 3.4 - 10.8 x10E3/uL   RBC 4.25 4.14 - 5.80 x10E6/uL   Hemoglobin 11.1 (L) 13.0 - 17.7 g/dL   Hematocrit 35.1 (L) 37.5 - 51.0 %   MCV 83 79 - 97 fL   MCH 26.1 (L) 26.6 - 33.0 pg   MCHC 31.6 31.5 - 35.7 g/dL   RDW 15.2 11.6 - 15.4 %   Platelets 202 150 - 450 x10E3/uL   Neutrophils 57 Not Estab. %   Lymphs 31 Not Estab. %   Monocytes 8 Not Estab. %   Eos 3 Not Estab. %   Basos 1 Not Estab. %   Neutrophils Absolute 2.3 1.4 - 7.0 x10E3/uL   Lymphocytes Absolute 1.2 0.7 - 3.1 x10E3/uL   Monocytes Absolute 0.3 0.1 - 0.9 x10E3/uL   EOS (ABSOLUTE) 0.1 0.0 - 0.4 x10E3/uL   Basophils Absolute 0.0 0.0 - 0.2 x10E3/uL   Immature Granulocytes 0 Not Estab. %    Immature Grans (Abs) 0.0 0.0 - 0.1 x10E3/uL  CMP14+EGFR     Status: Abnormal   Collection Time: 01/19/21  2:31 PM  Result Value Ref Range   Glucose 80 70 - 99 mg/dL    Comment:               **Please note reference interval change**   BUN 29 10 - 36 mg/dL   Creatinine, Ser 1.19 0.76 - 1.27 mg/dL   eGFR 56 (L) >59 mL/min/1.73   BUN/Creatinine Ratio 24 10 - 24   Sodium 144 134 - 144 mmol/L   Potassium 4.3 3.5 - 5.2 mmol/L   Chloride 103 96 - 106 mmol/L   CO2 26 20 - 29 mmol/L   Calcium 9.5 8.6 - 10.2 mg/dL   Total Protein 6.5 6.0 - 8.5 g/dL   Albumin 3.3 (L) 3.5 - 4.6 g/dL   Globulin, Total 3.2 1.5 - 4.5 g/dL   Albumin/Globulin Ratio 1.0 (L) 1.2 - 2.2   Bilirubin Total 0.2 0.0 - 1.2 mg/dL   Alkaline Phosphatase 164 (H) 44 - 121 IU/L   AST 12 0 - 40 IU/L   ALT 7 0 - 44 IU/L  Vitamin B12     Status: None   Collection Time: 01/19/21  2:31 PM  Result Value Ref Range   Vitamin B-12 813 232 - 1,245 pg/mL    BMET  PSA  No results found for: TESTOSTERONE  No results found. However, due to the size of the patient record, not all encounters were searched. Please check Results Review for a complete set of results.      Studies/Results: I have reviewed records, labs and cultures from his admission at [facility] in September.    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  He has some mild intermittant hematuria that has improved off of Eliquis.  He remains a poor surgical candidate and I will just have him return prn since we don't have anything to offer unless he needs emergent intervention for clot retention.  Incontinence.   He is voiding comfortably but has incontinence and remains on Myrbetriq.   No changes at this time.  No orders of the  defined types were placed in this encounter.    Orders Placed This Encounter  Procedures   Urinalysis, Routine w reflex microscopic        Return if symptoms worsen or fail to improve.   CC: Dettinger, Fransisca Kaufmann, MD      [name] 02/13/2021 Patient ID: [name], male   DOB: [birth year], 87 y.o.   MRN: [id number] Patient ID: MORIS RATCHFORD, male   DOB: [birth year], 87 y.o.   MRN: [id number]

## 2021-02-17 DIAGNOSIS — I5042 Chronic combined systolic (congestive) and diastolic (congestive) heart failure: Secondary | ICD-10-CM | POA: Diagnosis not present

## 2021-02-17 DIAGNOSIS — N3000 Acute cystitis without hematuria: Secondary | ICD-10-CM | POA: Diagnosis not present

## 2021-02-17 DIAGNOSIS — J440 Chronic obstructive pulmonary disease with acute lower respiratory infection: Secondary | ICD-10-CM | POA: Diagnosis not present

## 2021-02-17 DIAGNOSIS — I110 Hypertensive heart disease with heart failure: Secondary | ICD-10-CM | POA: Diagnosis not present

## 2021-02-17 DIAGNOSIS — J9611 Chronic respiratory failure with hypoxia: Secondary | ICD-10-CM | POA: Diagnosis not present

## 2021-02-17 DIAGNOSIS — J189 Pneumonia, unspecified organism: Secondary | ICD-10-CM | POA: Diagnosis not present

## 2021-02-18 ENCOUNTER — Other Ambulatory Visit: Payer: Self-pay | Admitting: Family Medicine

## 2021-02-20 ENCOUNTER — Encounter (HOSPITAL_COMMUNITY): Payer: Self-pay | Admitting: Emergency Medicine

## 2021-02-20 ENCOUNTER — Emergency Department (HOSPITAL_COMMUNITY): Payer: Medicare Other

## 2021-02-20 ENCOUNTER — Inpatient Hospital Stay (HOSPITAL_COMMUNITY)
Admission: EM | Admit: 2021-02-20 | Discharge: 2021-02-22 | DRG: 069 | Disposition: A | Discharge status: Home / Self Care | Payer: Medicare Other | Attending: Internal Medicine | Admitting: Internal Medicine

## 2021-02-20 ENCOUNTER — Other Ambulatory Visit: Payer: Self-pay

## 2021-02-20 DIAGNOSIS — I428 Other cardiomyopathies: Secondary | ICD-10-CM | POA: Diagnosis present

## 2021-02-20 DIAGNOSIS — Z86718 Personal history of other venous thrombosis and embolism: Secondary | ICD-10-CM

## 2021-02-20 DIAGNOSIS — Z87891 Personal history of nicotine dependence: Secondary | ICD-10-CM

## 2021-02-20 DIAGNOSIS — A419 Sepsis, unspecified organism: Secondary | ICD-10-CM | POA: Diagnosis present

## 2021-02-20 DIAGNOSIS — I482 Chronic atrial fibrillation, unspecified: Secondary | ICD-10-CM | POA: Diagnosis present

## 2021-02-20 DIAGNOSIS — Z9079 Acquired absence of other genital organ(s): Secondary | ICD-10-CM

## 2021-02-20 DIAGNOSIS — G459 Transient cerebral ischemic attack, unspecified: Principal | ICD-10-CM | POA: Diagnosis present

## 2021-02-20 DIAGNOSIS — I491 Atrial premature depolarization: Secondary | ICD-10-CM | POA: Diagnosis not present

## 2021-02-20 DIAGNOSIS — I447 Left bundle-branch block, unspecified: Secondary | ICD-10-CM | POA: Diagnosis present

## 2021-02-20 DIAGNOSIS — Z8551 Personal history of malignant neoplasm of bladder: Secondary | ICD-10-CM

## 2021-02-20 DIAGNOSIS — I252 Old myocardial infarction: Secondary | ICD-10-CM

## 2021-02-20 DIAGNOSIS — E46 Unspecified protein-calorie malnutrition: Secondary | ICD-10-CM

## 2021-02-20 DIAGNOSIS — R404 Transient alteration of awareness: Secondary | ICD-10-CM | POA: Diagnosis not present

## 2021-02-20 DIAGNOSIS — Z7901 Long term (current) use of anticoagulants: Secondary | ICD-10-CM

## 2021-02-20 DIAGNOSIS — Z20822 Contact with and (suspected) exposure to covid-19: Secondary | ICD-10-CM | POA: Diagnosis not present

## 2021-02-20 DIAGNOSIS — E785 Hyperlipidemia, unspecified: Secondary | ICD-10-CM | POA: Diagnosis present

## 2021-02-20 DIAGNOSIS — N179 Acute kidney failure, unspecified: Secondary | ICD-10-CM | POA: Diagnosis present

## 2021-02-20 DIAGNOSIS — M1990 Unspecified osteoarthritis, unspecified site: Secondary | ICD-10-CM | POA: Diagnosis present

## 2021-02-20 DIAGNOSIS — G8929 Other chronic pain: Secondary | ICD-10-CM | POA: Diagnosis present

## 2021-02-20 DIAGNOSIS — I110 Hypertensive heart disease with heart failure: Secondary | ICD-10-CM | POA: Diagnosis present

## 2021-02-20 DIAGNOSIS — I10 Essential (primary) hypertension: Secondary | ICD-10-CM | POA: Diagnosis present

## 2021-02-20 DIAGNOSIS — E440 Moderate protein-calorie malnutrition: Secondary | ICD-10-CM | POA: Diagnosis present

## 2021-02-20 DIAGNOSIS — I5022 Chronic systolic (congestive) heart failure: Secondary | ICD-10-CM | POA: Diagnosis present

## 2021-02-20 DIAGNOSIS — I739 Peripheral vascular disease, unspecified: Secondary | ICD-10-CM | POA: Diagnosis present

## 2021-02-20 DIAGNOSIS — Z8249 Family history of ischemic heart disease and other diseases of the circulatory system: Secondary | ICD-10-CM

## 2021-02-20 DIAGNOSIS — G9341 Metabolic encephalopathy: Secondary | ICD-10-CM | POA: Diagnosis not present

## 2021-02-20 DIAGNOSIS — I700 Atherosclerosis of aorta: Secondary | ICD-10-CM | POA: Diagnosis present

## 2021-02-20 DIAGNOSIS — I2510 Atherosclerotic heart disease of native coronary artery without angina pectoris: Secondary | ICD-10-CM | POA: Diagnosis present

## 2021-02-20 DIAGNOSIS — Z7982 Long term (current) use of aspirin: Secondary | ICD-10-CM

## 2021-02-20 DIAGNOSIS — F32A Depression, unspecified: Secondary | ICD-10-CM | POA: Diagnosis present

## 2021-02-20 DIAGNOSIS — I9589 Other hypotension: Secondary | ICD-10-CM | POA: Diagnosis present

## 2021-02-20 DIAGNOSIS — I959 Hypotension, unspecified: Secondary | ICD-10-CM | POA: Diagnosis not present

## 2021-02-20 DIAGNOSIS — R531 Weakness: Secondary | ICD-10-CM | POA: Diagnosis not present

## 2021-02-20 DIAGNOSIS — J90 Pleural effusion, not elsewhere classified: Secondary | ICD-10-CM | POA: Diagnosis not present

## 2021-02-20 DIAGNOSIS — N400 Enlarged prostate without lower urinary tract symptoms: Secondary | ICD-10-CM | POA: Diagnosis present

## 2021-02-20 DIAGNOSIS — Z888 Allergy status to other drugs, medicaments and biological substances status: Secondary | ICD-10-CM

## 2021-02-20 DIAGNOSIS — K219 Gastro-esophageal reflux disease without esophagitis: Secondary | ICD-10-CM | POA: Diagnosis present

## 2021-02-20 DIAGNOSIS — F0390 Unspecified dementia without behavioral disturbance: Secondary | ICD-10-CM | POA: Diagnosis present

## 2021-02-20 DIAGNOSIS — G40909 Epilepsy, unspecified, not intractable, without status epilepticus: Secondary | ICD-10-CM | POA: Diagnosis present

## 2021-02-20 DIAGNOSIS — E861 Hypovolemia: Secondary | ICD-10-CM | POA: Diagnosis not present

## 2021-02-20 DIAGNOSIS — Z66 Do not resuscitate: Secondary | ICD-10-CM | POA: Diagnosis not present

## 2021-02-20 DIAGNOSIS — N390 Urinary tract infection, site not specified: Secondary | ICD-10-CM | POA: Diagnosis present

## 2021-02-20 DIAGNOSIS — Z89612 Acquired absence of left leg above knee: Secondary | ICD-10-CM

## 2021-02-20 DIAGNOSIS — Z86711 Personal history of pulmonary embolism: Secondary | ICD-10-CM

## 2021-02-20 DIAGNOSIS — H409 Unspecified glaucoma: Secondary | ICD-10-CM | POA: Diagnosis present

## 2021-02-20 DIAGNOSIS — R4182 Altered mental status, unspecified: Secondary | ICD-10-CM | POA: Diagnosis present

## 2021-02-20 DIAGNOSIS — J449 Chronic obstructive pulmonary disease, unspecified: Secondary | ICD-10-CM | POA: Diagnosis present

## 2021-02-20 DIAGNOSIS — R0902 Hypoxemia: Secondary | ICD-10-CM | POA: Diagnosis not present

## 2021-02-20 DIAGNOSIS — E860 Dehydration: Secondary | ICD-10-CM | POA: Diagnosis present

## 2021-02-20 DIAGNOSIS — Z79899 Other long term (current) drug therapy: Secondary | ICD-10-CM

## 2021-02-20 DIAGNOSIS — R319 Hematuria, unspecified: Secondary | ICD-10-CM | POA: Diagnosis present

## 2021-02-20 DIAGNOSIS — Z881 Allergy status to other antibiotic agents status: Secondary | ICD-10-CM

## 2021-02-20 DIAGNOSIS — H5462 Unqualified visual loss, left eye, normal vision right eye: Secondary | ICD-10-CM | POA: Diagnosis present

## 2021-02-20 DIAGNOSIS — R569 Unspecified convulsions: Secondary | ICD-10-CM

## 2021-02-20 DIAGNOSIS — I440 Atrioventricular block, first degree: Secondary | ICD-10-CM | POA: Diagnosis not present

## 2021-02-20 DIAGNOSIS — Z6825 Body mass index (BMI) 25.0-25.9, adult: Secondary | ICD-10-CM

## 2021-02-20 LAB — URINALYSIS, ROUTINE W REFLEX MICROSCOPIC
Bacteria, UA: NONE SEEN
Bilirubin Urine: NEGATIVE
Glucose, UA: NEGATIVE mg/dL
Ketones, ur: NEGATIVE mg/dL
Leukocytes,Ua: NEGATIVE
Nitrite: NEGATIVE
Protein, ur: 100 mg/dL — AB
RBC / HPF: >50 RBC/hpf — ABNORMAL HIGH (ref 0–5)
Specific Gravity, Urine: 1.01 (ref 1.005–1.030)
pH: 5 (ref 5.0–8.0)

## 2021-02-20 LAB — COMPREHENSIVE METABOLIC PANEL
ALT: 10 U/L (ref 0–44)
AST: 16 U/L (ref 15–41)
Albumin: 3.3 g/dL — ABNORMAL LOW (ref 3.5–5.0)
Alkaline Phosphatase: 159 U/L — ABNORMAL HIGH (ref 38–126)
Anion gap: 6 (ref 5–15)
BUN: 48 mg/dL — ABNORMAL HIGH (ref 8–23)
CO2: 26 mmol/L (ref 22–32)
Calcium: 8.6 mg/dL — ABNORMAL LOW (ref 8.9–10.3)
Chloride: 105 mmol/L (ref 98–111)
Creatinine, Ser: 1.89 mg/dL — ABNORMAL HIGH (ref 0.61–1.24)
GFR, Estimated: 32 mL/min — ABNORMAL LOW (ref >60)
Glucose, Bld: 110 mg/dL — ABNORMAL HIGH (ref 70–99)
Potassium: 4.1 mmol/L (ref 3.5–5.1)
Sodium: 137 mmol/L (ref 135–145)
Total Bilirubin: 0.2 mg/dL — ABNORMAL LOW (ref 0.3–1.2)
Total Protein: 6.5 g/dL (ref 6.5–8.1)

## 2021-02-20 LAB — CBC WITH DIFFERENTIAL/PLATELET
Abs Immature Granulocytes: 0 10*3/uL (ref 0.00–0.07)
Basophils Absolute: 0 10*3/uL (ref 0.0–0.1)
Basophils Relative: 1 %
Eosinophils Absolute: 0.1 10*3/uL (ref 0.0–0.5)
Eosinophils Relative: 3 %
HCT: 32.8 % — ABNORMAL LOW (ref 39.0–52.0)
Hemoglobin: 10.3 g/dL — ABNORMAL LOW (ref 13.0–17.0)
Immature Granulocytes: 0 %
Lymphocytes Relative: 34 %
Lymphs Abs: 1.4 10*3/uL (ref 0.7–4.0)
MCH: 27.3 pg (ref 26.0–34.0)
MCHC: 31.4 g/dL (ref 30.0–36.0)
MCV: 87 fL (ref 80.0–100.0)
Monocytes Absolute: 0.4 10*3/uL (ref 0.1–1.0)
Monocytes Relative: 10 %
Neutro Abs: 2.3 10*3/uL (ref 1.7–7.7)
Neutrophils Relative %: 52 %
Platelets: 178 10*3/uL (ref 150–400)
RBC: 3.77 MIL/uL — ABNORMAL LOW (ref 4.22–5.81)
RDW: 16.5 % — ABNORMAL HIGH (ref 11.5–15.5)
WBC: 4.3 10*3/uL (ref 4.0–10.5)
nRBC: 0 % (ref 0.0–0.2)

## 2021-02-20 LAB — RESP PANEL BY RT-PCR (FLU A&B, COVID) ARPGX2
Influenza A by PCR: NEGATIVE
Influenza B by PCR: NEGATIVE
SARS Coronavirus 2 by RT PCR: NEGATIVE

## 2021-02-20 LAB — LACTIC ACID, PLASMA
Lactic Acid, Venous: 1.3 mmol/L (ref 0.5–1.9)
Lactic Acid, Venous: 1.9 mmol/L (ref 0.5–1.9)

## 2021-02-20 LAB — CBG MONITORING, ED: Glucose-Capillary: 110 mg/dL — ABNORMAL HIGH (ref 70–99)

## 2021-02-20 LAB — PROTIME-INR
INR: 1.2 (ref 0.8–1.2)
Prothrombin Time: 15.2 seconds (ref 11.4–15.2)

## 2021-02-20 LAB — APTT: aPTT: 29 seconds (ref 24–36)

## 2021-02-20 MED ORDER — VANCOMYCIN HCL 1000 MG/200ML IV SOLN: 1000.0000 mg | INTRAVENOUS | Status: DC

## 2021-02-20 MED ORDER — CEFEPIME HCL 2 G IJ SOLR
2.0000 g | Freq: Once | INTRAMUSCULAR | Status: AC
  Administered 2021-02-20: 2 g via INTRAVENOUS
  Filled 2021-02-20: qty 2

## 2021-02-20 MED ORDER — LACTATED RINGERS IV BOLUS (SEPSIS)
1000.0000 mL | Freq: Once | INTRAVENOUS | Status: AC
  Administered 2021-02-20: 1000 mL via INTRAVENOUS

## 2021-02-20 MED ORDER — LACTATED RINGERS IV SOLN: INTRAVENOUS | Status: DC

## 2021-02-20 MED ORDER — LACTATED RINGERS IV BOLUS (SEPSIS)
250.0000 mL | Freq: Once | INTRAVENOUS | Status: AC
  Administered 2021-02-20: 250 mL via INTRAVENOUS

## 2021-02-20 MED ORDER — SODIUM CHLORIDE 0.9 % IV SOLN
2.0000 g | Freq: Once | INTRAVENOUS | Status: DC
  Filled 2021-02-20: qty 2

## 2021-02-20 MED ORDER — METRONIDAZOLE 500 MG/100ML IV SOLN
500.0000 mg | Freq: Once | INTRAVENOUS | Status: AC
  Administered 2021-02-20: 500 mg via INTRAVENOUS
  Filled 2021-02-20: qty 100

## 2021-02-20 MED ORDER — VANCOMYCIN HCL 1250 MG/250ML IV SOLN
1250.0000 mg | Freq: Once | INTRAVENOUS | Status: AC
  Administered 2021-02-20: 1250 mg via INTRAVENOUS
  Filled 2021-02-20: qty 250

## 2021-02-20 MED ORDER — SODIUM CHLORIDE 0.9 % IV SOLN: 2.0000 g | INTRAVENOUS | Status: DC

## 2021-02-20 NOTE — ED Notes (Signed)
Pt given 532ml bolus NS per EMS.

## 2021-02-20 NOTE — ED Notes (Signed)
MD at bedside for MSE

## 2021-02-20 NOTE — ED Triage Notes (Signed)
Pt arrived by [facility]. Family called EMS d/t pt BP not reading on home machine. Pt was hypotensive w/ EMS systolic of 70. Pt received 500cc NS in route.

## 2021-02-20 NOTE — Progress Notes (Signed)
Pharmacy Antibiotic Note  Scott [name] is a 87 y.o. male admitted on 02/20/2021 with sepsis.  Pharmacy has been consulted for vancomycin and aztreonam dosing. Aztreonam was discontinued and cefepime was started   Initial consult was for aztreonam if patient's listed allergy to cephalosporins was a true allergy. I was able to locate records that show he received cefazolin and cefepime in the past. The allergy listed is to rocephin and the reaction is seizures. The order was converted to cefepime.   Plan: Vanco 1250 mg iv x1 and cefepime 2 grams iv x1 were given to expedite antibiotics while lab work was being processed  The following doses were prescribed once lab results were available: Vanco 1000 mg iv q48h (starting 02/22/2021) Cefepime 2 grams iv q24h (starting 02/21/2021) Both antibiotics have been ordered for 7 days per the consults received  Height: 5\' 6"  (167.6 cm) Weight: 73 kg (160 lb 15 oz) IBW/kg (Calculated) : 63.8  Temp (24hrs), Avg:98.7 F (37.1 C), Min:98.7 F (37.1 C), Max:98.7 F (37.1 C)  Recent Labs  Lab 02/20/21 1900  WBC 4.3  CREATININE 1.89*  LATICACIDVEN 1.3    Estimated Creatinine Clearance: 20.2 mL/min (A) (by C-G formula based on SCr of 1.89 mg/dL (H)).    Allergies  Allergen Reactions   Atorvastatin Hives and Rash   Rocephin [Ceftriaxone Sodium In Dextrose] Other (See Comments)    Recurrent seizures    Antimicrobials this admission: Vancomycin 11/11 >> 11/17 Cefepime 11/11 >> 11/17  Dose adjustments this admission: Both medications were adjusted and reflective in this note of an initial serum creatinine of 1.89 and creatinine clearance of 20.2 ml/min on 02/20/2021 at 20:07  Microbiology results: 11/11 BCx: X2 drawn- awaiting results. Will follow-up in 18-24 hours  Patient has a history of care received at [facility]. A recent admission on 01/05/2021 shows he was treated for pneumonia and discharged on Levaquin 500 mg daily. During the  admission, orders for aztreonam and vancomycin are noted.   Thank you for allowing pharmacy to be a part of this patient's care.  Vaughan Basta BS, PharmD, BCPS Clinical Pharmacist  02/20/2021 8:23 PM

## 2021-02-20 NOTE — ED Provider Notes (Signed)
[facility] EMERGENCY DEPARTMENT Provider Note   CSN: [id number] Arrival date & time: 02/20/21  8938     History Chief Complaint  Patient presents with   Hypotension    Scott [name] is a 87 y.o. male.  HPI He presents by emergency medical services for evaluation of unspecified hypotension.  In the field his systolic blood pressure was 57 and he was given 500 cc of saline with improvement of his blood pressure to 76 systolic.  EMS had been to his home earlier today, for "a seizure," and his blood pressure was low then, but he was not transported at that time.  He lives with family members who have been monitoring his blood pressure at home.  He is unable to give additional history.  Level 5 caveat-altered mental status    Past Medical History:  Diagnosis Date   Arthritis    "all over"   Atrial fibrillation ([place])    Benign localized prostatic hyperplasia with lower urinary tract symptoms (LUTS)    Bradycardia 11/28/2015   Severe-HR in the 20s to 30s following intubation for urological procedure.   CAD- non obstructive disease by cath 3/14 cardiologist-  dr hochrein   Chronic lower back pain    COPD (chronic obstructive pulmonary disease) ([place])    DDD (degenerative disc disease)    Depression    Dyspnea    w/ exertion and lying down (raise head)   ETOH abuse    GERD (gastroesophageal reflux disease)    Glaucoma    Glaucoma, both eyes    History of bladder cancer urologist-- dr Jeffie Pollock   first dx 2012--  s/p TURBT's   History of DVT of lower extremity 03/2013   bilateral    History of pulmonary embolus (PE) 04/2013   HTN (hypertension)    Hyperlipidemia    Hypertension    LBBB (left bundle branch block)    chronic   Non-ischemic cardiomyopathy ([place])    last echo 01-18-2017, ef 35-40%   NSVT (nonsustained ventricular tachycardia)    a. NSVT 06/2012; NSVT also seen during 03/2013 adm. b. Med rx. Not candidate for ICD given adv age.   PAD (peripheral artery disease)  (HCC)    Popliteal artery aneurysm, bilateral (HCC)    DOCUMENTED CHRONIC PARTIAL OCCLUSION--  PT DENIES CLAUDICATION OR ANY OTHER SYMPTOMS   PVCs (premature ventricular contractions)    PVD (peripheral vascular disease) (HCC)    Right ABI .75, Left .78 (2006)   Syncope 06/2012   a. Felt to be postural syncope related to diuretics 06/2012.   Systolic and diastolic CHF, chronic (HCC) cardiologsit-  dr hochrein   a. NICM - patent cors 06/2012, EF 40% at that time. b. 03/2013 eval: EF 20-25%.   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   Alzheimer disease ([place]) 09/07/2018   Paroxysmal atrial fibrillation ([place]) 01/18/2017   PVC (pulmonary venous congestion) 01/18/2017   Thoracic aortic atherosclerosis ([place]) 11/26/2016   Essential hypertension 11/29/2015   Malignant neoplasm of lateral wall of urinary bladder ([place]) 07/10/2015   Status post above-knee amputation of left lower extremity ([place]) 10/18/2014   Atherosclerotic PVD with ulceration ([place]) 10/03/2014  PAD (peripheral artery disease) ([place]) 09/24/2014   History of non-ST elevation myocardial infarction (NSTEMI) 10/09/2013   Vitamin D deficiency 05/17/2013   History of pulmonary embolism (2014) 04/17/2013   NSVT (nonsustained ventricular tachycardia)- not an ICD candidate 04/17/2013   NICM (nonischemic cardiomyopathy)- EF 20-25% by Echo 04/12/13 04/17/2013   Chronic combined systolic and diastolic heart failure ([place]) 03/15/2013   History of DVT (deep vein thrombosis) 03/15/2013   Benign localized hyperplasia of prostate with urinary obstruction 03/15/2012   Hyperlipidemia    PVCs (premature ventricular contractions)     CAD- non obstructive disease by cath 3/14     Past Surgical History:  Procedure Laterality Date   AMPUTATION Left 10/16/2014   Procedure: AMPUTATION ABOVE KNEE- LEFT;  Surgeon: Mal Misty, MD;  Location: [facility];  Service: Vascular;  Laterality: Left;   CARDIAC CATHETERIZATION  05-11-2004  DR [place] PLAQUE/ NORMAL LVF/ EF 55%/  NON-OBSTRUCTIVE LAD 25%   CARDIAC CATHETERIZATION  06/18/2012   dr hochrein   mild luminal irregularities of coronaries, ef 45-50%   CARDIOVASCULAR STRESS TEST  10-15-2010   LOW RISK NUCLEAR STUDY/ NO EVIDENCE OF ISCHEMIA/ NORMAL EF   CATARACT EXTRACTION W/ INTRAOCULAR LENS  IMPLANT, BILATERAL Bilateral    CYSTOSCOPY  10/07/2011   Procedure: CYSTOSCOPY;  Surgeon: Malka So, MD;  [facility];  Service: Urology;  Laterality: N/A;   CYSTOSCOPY N/A 10/20/2018   Procedure: CYSTOSCOPY WITH FULGURATION OF PROSTATIC URETHRAL TUMOR;  Surgeon: [name], MD;  Location: [facility];  Service: Urology;  Laterality: N/A;   CYSTOSCOPY N/A 10/11/2019   Procedure: CYSTOSCOPY;  Surgeon: Cleon Gustin, MD;  Location: [facility];  Service: Urology;  Laterality: N/A;   CYSTOSCOPY W/ RETROGRADES Bilateral 03/04/2020   Procedure: CYSTOSCOPY WITH BILATERAL RETROGRADE;  Surgeon: [name], MD;  Location: [facility];  Service: Urology;  Laterality: Bilateral;   CYSTOSCOPY WITH BIOPSY  03/14/2012   Procedure: CYSTOSCOPY WITH BIOPSY;  Surgeon: Malka So, MD;  Location: [facility];  Service: Urology;  Laterality: N/A;  WITH FULGURATION   CYSTOSCOPY WITH BIOPSY N/A 12/09/2016   Procedure: CYSTOSCOPY WITH BIOPSY AND FULGURATION;  Surgeon: [name], MD;  Location: [facility];  Service: Urology;  Laterality: N/A;   CYSTOSCOPY WITH BIOPSY N/A 12/20/2017   Procedure: CYSTOSCOPY WITH BIOPSY WITH FULGURATION;  Surgeon: [name], MD;  Location: [facility];  Service: Urology;  Laterality: N/A;   CYSTOSCOPY WITH BIOPSY N/A 06/09/2018   Procedure: CYSTOSCOPY WITH  BIOPSY AND FULGURATION;  Surgeon: [name], MD;  Location: [facility];  Service: Urology;  Laterality: N/A;   CYSTOSCOPY WITH FULGERATION N/A 01/20/2016   Procedure: CYSTOSCOPY, BIOPSY WITH FULGERATION OF URTHRAL TUMOR;  Surgeon: [name], MD;  Location: [facility];  Service: Urology;  Laterality: N/A;   CYSTOSCOPY WITH FULGERATION N/A 06/01/2016   Procedure: CYSTOSCOPY WITH FULGERATION urethral tumors and bladder neck;  Surgeon: [name], MD;  Location: [facility];  Service: Urology;  Laterality: N/A;   SHOULDER SURGERY Left 1970's   TONSILLECTOMY     TRANSTHORACIC ECHOCARDIOGRAM  01-18-2017   dr hochrein   moderate LVH, ef 35-40%, diffuse hypokinesis/  trivial AR and MR/  mild TR   TRANSURETHRAL RESECTION OF BLADDER TUMOR  10/07/2011   Procedure: TRANSURETHRAL RESECTION OF BLADDER TUMOR (TURBT);  Surgeon: Malka So, MD;  Location: [facility];  Service: Urology;  Laterality: N/A;   TRANSURETHRAL RESECTION OF BLADDER TUMOR N/A 07/10/2015   Procedure: TRANSURETHRAL  RESECTION OF BLADDER TUMOR (TURBT), CYSTOSCOPY ;  Surgeon: [name], MD;  Location[facility];  Service: Urology;  Laterality: N/A;   TRANSURETHRAL RESECTION OF BLADDER TUMOR N/A 10/11/2019   Procedure: TRANSURETHRAL RESECTION OF PROSTATIC URETHRAL TUMOR;  Surgeon: Cleon Gustin, MD;  Location: [facility];  Service: Urology;  Laterality: N/A;   TRANSURETHRAL RESECTION OF BLADDER TUMOR WITH MITOMYCIN-C N/A 03/04/2020   Procedure: TRANSURETHRAL RESECTION OF BLADDER TUMOR WITH GEMCITABINE;  Surgeon: [name], MD;  Location: [facility];  Service: Urology;  Laterality: N/A;   TRANSURETHRAL RESECTION OF PROSTATE  03/14/2012   Procedure: TRANSURETHRAL RESECTION OF THE PROSTATE WITH GYRUS INSTRUMENTS;  Surgeon: Malka So, MD;  Location[facility];  Service: Urology;  Laterality: N/A;       Family History  Problem Relation Age of Onset   Hypertension Mother    Arthritis Mother    Lung cancer Sister    Deep vein thrombosis Father     Early death Brother     Social History   Tobacco Use   Smoking status: Former    Packs/day: 1.00    Years: 35.00    Pack years: 35.00    Types: Cigarettes    Start date: 04/12/1942    Quit date: 04/13/1975    Years since quitting: 45.8   Smokeless tobacco: Never  Vaping Use   Vaping Use: Never used  Substance Use Topics   Alcohol use: Not Currently    Alcohol/week: 7.0 standard drinks    Types: 7 Cans of beer per week   Drug use: No    Home Medications Prior to Admission medications   Medication Sig Start Date End Date Taking? Authorizing Provider  acetaminophen (TYLENOL) 500 MG tablet Take 1,000 mg by mouth every 6 (six) hours as needed for moderate pain or headache.    [provider]  albuterol (PROVENTIL) (2.5 MG/3ML) 0.083% nebulizer solution Take 3 mLs (2.5 mg total) by nebulization every 6 (six) hours as needed for wheezing or shortness of breath. 09/04/20   Dettinger, Fransisca Kaufmann, MD  albuterol (VENTOLIN HFA) 108 (90 Base) MCG/ACT inhaler Inhale 2 puffs into the lungs every 6 (six) hours as needed for wheezing or shortness of breath. 09/26/20   Dettinger, Fransisca Kaufmann, MD  aspirin EC 81 MG EC tablet Take 1 tablet (81 mg total) by mouth daily. Swallow whole. 08/20/20   Manuella Ghazi, Pratik D, DO  carvedilol (COREG) 6.25 MG tablet Take 1 tablet (6.25 mg total) by mouth 2 (two) times daily with a meal. 07/21/20   Dettinger, Fransisca Kaufmann, MD  donepezil (ARICEPT) 10 MG tablet Take 1 tablet (10 mg total) by mouth at bedtime. 07/21/20   Dettinger, Fransisca Kaufmann, MD  ELIQUIS 2.5 MG TABS tablet Take 2.5 mg by mouth 2 (two) times daily. 01/02/21   [provider]  febuxostat (ULORIC) 40 MG tablet Take 1 tablet (40 mg total) by mouth daily. 07/21/20   Dettinger, Fransisca Kaufmann, MD  fluticasone (FLONASE) 50 MCG/ACT nasal spray Place 1 spray into both nostrils 2 (two) times daily as needed for allergies or rhinitis. SPRAY 1 SPRAY IN EACH NOSTRIL ONCE DAILY. 09/26/20   Dettinger, Fransisca Kaufmann, MD  furosemide  (LASIX) 20 MG tablet TAKE 1 TABLET DAILY AS NEEDED (signs of volume overload). 09/03/20   Minus Breeding, MD  gabapentin (NEURONTIN) 400 MG capsule TAKE 1 CAPSULE 2 TIMES A DAY 02/05/21   Dettinger, Fransisca Kaufmann, MD  Ipratropium-Albuterol (COMBIVENT RESPIMAT) 20-100 MCG/ACT AERS respimat Inhale 1 puff into  the lungs every 6 (six) hours as needed for wheezing or shortness of breath (As needed for wheezing , cough or shortness of breath). 09/26/20   Dettinger, Fransisca Kaufmann, MD  levETIRAcetam (KEPPRA) 250 MG tablet TAKE ONE TABLET TWICE DAILY 01/28/21   Dettinger, Fransisca Kaufmann, MD  levofloxacin (LEVAQUIN) 500 MG tablet Take 1 tablet by mouth daily. 01/07/21   [provider]  losartan (COZAAR) 50 MG tablet TAKE ONE TABLET ONCE DAILY 01/28/21   Dettinger, Fransisca Kaufmann, MD  MYRBETRIQ 25 MG TB24 tablet Take 1 tablet (25 mg total) by mouth daily. 12/03/20   McKenzie, Candee Furbish, MD  pantoprazole (PROTONIX) 40 MG tablet Take 1 tablet (40 mg total) by mouth daily. 07/21/20   Dettinger, Fransisca Kaufmann, MD  sertraline (ZOLOFT) 50 MG tablet Take 1 tablet (50 mg total) by mouth daily. 07/21/20   Dettinger, Fransisca Kaufmann, MD  simvastatin (ZOCOR) 10 MG tablet Take 1 tablet (10 mg total) by mouth daily at 6 PM. 07/21/20   Dettinger, Fransisca Kaufmann, MD  tamsulosin (FLOMAX) 0.4 MG CAPS capsule TAKE 1 CAPSULE DAILY 02/18/21   Dettinger, Fransisca Kaufmann, MD    Allergies    Atorvastatin and Rocephin [ceftriaxone sodium in dextrose]  Review of Systems   Review of Systems  Unable to perform ROS: Mental status change   Physical Exam Updated Vital Signs BP 100/65   Pulse 80   Temp 98.7 F (37.1 C) (Rectal)   Resp 14   Ht _0  (1.676 m)   Wt 73 kg   SpO2 99%   BMI 25.98 kg/m   Physical Exam Vitals and nursing note reviewed.  Constitutional:      General: He is not in acute distress.    Appearance: He is well-developed. He is ill-appearing. He is not toxic-appearing or diaphoretic.  HENT:     Head: Normocephalic and atraumatic.     Right  Ear: External ear normal.     Left Ear: External ear normal.     Nose: Nose normal. No congestion.     Mouth/Throat:     Mouth: Mucous membranes are moist.  Eyes:     Conjunctiva/sclera: Conjunctivae normal.     Pupils: Pupils are equal, round, and reactive to light.  Neck:     Trachea: Phonation normal.  Cardiovascular:     Rate and Rhythm: Normal rate and regular rhythm.     Heart sounds: Normal heart sounds. No murmur heard.   No friction rub.  Pulmonary:     Effort: Pulmonary effort is normal.     Breath sounds: Normal breath sounds.  Abdominal:     Palpations: Abdomen is soft.     Tenderness: There is no abdominal tenderness.  Musculoskeletal:        General: Normal range of motion.     Cervical back: Normal range of motion and neck supple.     Comments: Left AKA  Skin:    General: Skin is warm and dry.  Neurological:     Mental Status: He is alert and oriented to person, place, and time.     Cranial Nerves: No cranial nerve deficit.     Sensory: No sensory deficit.     Motor: No abnormal muscle tone.     Coordination: Coordination normal.     Comments: Lying with eyes closed, opens on command.  He is conversant.  He is a poor historian.  There is no dysarthria or aphasia.  Psychiatric:        Mood  and Affect: Mood normal.        Behavior: Behavior normal.    ED Results / Procedures / Treatments   Labs (all labs ordered are listed, but only abnormal results are displayed) Labs Reviewed  COMPREHENSIVE METABOLIC PANEL - Abnormal; Notable for the following components:      Result Value   Glucose, Bld 110 (*)    BUN 48 (*)    Creatinine, Ser 1.89 (*)    Calcium 8.6 (*)    Albumin 3.3 (*)    Alkaline Phosphatase 159 (*)    Total Bilirubin 0.2 (*)    GFR, Estimated 32 (*)    All other components within normal limits  CBC WITH DIFFERENTIAL/PLATELET - Abnormal; Notable for the following components:   RBC 3.77 (*)    Hemoglobin 10.3 (*)    HCT 32.8 (*)    RDW 16.5  (*)    All other components within normal limits  URINALYSIS, ROUTINE W REFLEX MICROSCOPIC - Abnormal; Notable for the following components:   APPearance HAZY (*)    Hgb urine dipstick LARGE (*)    Protein, ur 100 (*)    RBC / HPF >50 (*)    All other components within normal limits  CBG MONITORING, ED - Abnormal; Notable for the following components:   Glucose-Capillary 110 (*)    All other components within normal limits  RESP PANEL BY RT-PCR (FLU A&B, COVID) ARPGX2  CULTURE, BLOOD (ROUTINE X 2)  CULTURE, BLOOD (ROUTINE X 2)  URINE CULTURE  LACTIC ACID, PLASMA  LACTIC ACID, PLASMA  PROTIME-INR  APTT    EKG EKG Interpretation  Date/Time:  Friday February 20 2021 18:54:21 EST Ventricular Rate:  82 PR Interval:    QRS Duration: 152 QT Interval:  428 QTC Calculation: 500 R Axis:   165 Text Interpretation: Accelerated junctional rhythm Nonspecific intraventricular conduction delay since last tracing no significant change Confirmed by Daleen Bo [phone]) on 02/20/2021 7:02:56 PM  Radiology DG Chest Port 1 View  Result Date: 02/20/2021 CLINICAL DATA:  Hypotension EXAM: PORTABLE CHEST 1 VIEW COMPARISON:  01/06/2019 FINDINGS: Trace right pleural effusion. No frank interstitial edema. No pneumothorax. The heart is top-normal in size.  Thoracic aortic atherosclerosis. IMPRESSION: Trace right pleural effusion. Electronically Signed   By: Julian Hy M.D.   On: 02/20/2021 19:55    Procedures .Critical Care Performed by: Daleen Bo, MD Authorized by: Daleen Bo, MD   Critical care provider statement:    Critical care time (minutes):  55   Critical care start time:  02/21/2021 6:55 PM   Critical care end time:  02/21/2021 12:03 AM   Critical care time was exclusive of:  Separately billable procedures and treating other patients   Critical care was time spent personally by me on the following activities:  Blood draw for specimens, development of treatment plan with  patient or surrogate, discussions with consultants, evaluation of patient's response to treatment, examination of patient, ordering and performing treatments and interventions, ordering and review of laboratory studies, ordering and review of radiographic studies, pulse oximetry, re-evaluation of patient's condition and review of old charts   Medications Ordered in ED Medications  lactated ringers infusion ( Intravenous New Bag/Given 02/20/21 2135)  aztreonam (AZACTAM) 2 g in sodium chloride 0.9 % 100 mL IVPB (2 g Intravenous Not Given 02/20/21 1959)  ceFEPIme (MAXIPIME) 2 g in sodium chloride 0.9 % 100 mL IVPB (has no administration in time range)  vancomycin (VANCOREADY) IVPB 1000 mg/200 mL (has no  administration in time range)  lactated ringers bolus 1,000 mL (0 mLs Intravenous Stopped 02/20/21 1939)    And  lactated ringers bolus 1,000 mL (0 mLs Intravenous Stopped 02/20/21 2133)    And  lactated ringers bolus 250 mL (0 mLs Intravenous Stopped 02/20/21 2051)  metroNIDAZOLE (FLAGYL) IVPB 500 mg (0 mg Intravenous Stopped 02/20/21 1939)  vancomycin (VANCOREADY) IVPB 1250 mg/250 mL (0 mg Intravenous Stopped 02/20/21 2133)  ceFEPIme (MAXIPIME) 2 g in sodium chloride 0.9 % 100 mL IVPB (0 g Intravenous Stopped 02/20/21 2051)    ED Course  I have reviewed the triage vital signs and the nursing notes.  Pertinent labs & imaging results that were available during my care of the patient were reviewed by me and considered in my medical decision making (see chart for details).  Clinical Course as of 02/21/21 0004  Ludwig Clarks Feb 20, 2021  2307 Patient was catheterized for urine sample had 500 cc in the urinary bladder. [EW]  2328 Patient's son is now here and states that today the patient's caregiver called him because the patient was last alert than usual.  His blood pressure was low, so they called EMS.  They elected not to transport him, and then later when his blood pressure did not come up, called EMS  again.  Patient's son states the patient had decreased responsiveness when he saw him prior to the second EMS call, and that now he is somewhat better than usual but still not at his baseline.  Typically the patient can feed himself and take care of some of his daily activities.  He has 24/7 in-home assistance.  There have been no other recent illnesses according to the son who is his power of attorney. [EW]    Clinical Course User Index [EW] Daleen Bo, MD   MDM Rules/Calculators/A&P                            Patient Vitals for the past 24 hrs:  BP Temp Temp src Pulse Resp SpO2 Height Weight  02/20/21 2300 100/65 -- -- 80 14 99 % -- --  02/20/21 2200 97/61 -- -- 76 16 97 % -- --  02/20/21 2100 97/62 -- -- 71 17 97 % -- --  02/20/21 2045 -- -- -- (!) 56 14 100 % -- --  02/20/21 2030 106/69 -- -- 70 16 96 % -- --  02/20/21 2000 (!) 96/59 -- -- 68 17 98 % -- --  02/20/21 1930 (!) 90/52 -- -- (!) 29 17 97 % -- --  02/20/21 1900 (!) 81/61 -- -- -- 15 -- -- --  02/20/21 1858 -- 98.7 F (37.1 C) Rectal -- -- -- -- --  02/20/21 1855 (!) 86/55 -- -- -- 16 -- _0  (1.676 m) 73 kg    11:29 PM Reevaluation with update and discussion. After initial assessment and treatment, an updated evaluation reveals he continues to show improved status, from when he arrived.  Vital signs are normal.. Daleen Bo   Medical Decision Making:  This patient is presenting for evaluation of hypotension, which does require a range of treatment options, and is a complaint that involves a high risk of morbidity and mortality. The differential diagnoses include volume depletion, acute infection, metabolic disorder. I decided to review old records, and in summary elderly male, with a history of prostate and urethral cancer, who is debilitated and recently followed up with his urologist 8 days ago.  He has known low blood pressure chronically..  I obtained additional historical information from son who is power of  attorney at bedside.  Clinical Laboratory Tests Ordered, included CBC, Metabolic panel, Urinalysis, and lactate, blood cultures . Review indicates normal except hemoglobin low, glucose high, BUN high, creatinine high, calcium low, albumin low, alk phos stays high, total bilirubin low, GFR low. Radiologic Tests Ordered, included chest x-ray.  I independently Visualized: Radiograph images, which show no infiltrate or edema  Cardiac Monitor Tracing which shows junctional rhythm    Critical Interventions-clinical evaluation, laboratory testing, radiography, empiric antibiotics and IV bolus with lactated Ringer's, urinary catheterization, observation and reassessment  After These Interventions, the Patient was reevaluated and was found with improved blood pressure after IV fluid treatment.  Nonspecific elevation of BUN and creatinine likely volume depletion favored over urinary retention.  Possible UTI and urinary retention, however urinalysis abnormality could be from known prostate and urethral cancer.  Blood pressure improved after IV fluid bolus and empiric antibiotics.  Doubt severe sepsis, impending vascular collapse.  Significant creatinine increased from baseline is nonspecific.  Note that patient is on diuretics as well as multiple medications which will likely need to be addressed and possibly modified due to ongoing hypotension.  Hospitalist consulted to admit for observation, and repeat laboratory testing, with potential discharge tomorrow.  CRITICAL CARE-yes Performed by: Daleen Bo  Nursing Notes Reviewed/ Care Coordinated Applicable Imaging Reviewed Interpretation of Laboratory Data incorporated into ED treatment   11:40 PM-Consult complete with hospitalist. Patient case explained and discussed.  She agrees to admit patient for further evaluation and treatment. Call ended at 11:55 PM    Final Clinical Impression(s) / ED Diagnoses Final diagnoses:  Hypotension due to  hypovolemia  AKI (acute kidney injury) [facility])    Rx / DC Orders ED Discharge Orders     None        Daleen Bo, MD 02/21/21 0004

## 2021-02-20 NOTE — Sepsis Progress Note (Signed)
Following for sepsis monitoring ?

## 2021-02-21 ENCOUNTER — Encounter (HOSPITAL_COMMUNITY): Payer: Self-pay | Admitting: Family Medicine

## 2021-02-21 DIAGNOSIS — Z66 Do not resuscitate: Secondary | ICD-10-CM | POA: Diagnosis present

## 2021-02-21 DIAGNOSIS — E440 Moderate protein-calorie malnutrition: Secondary | ICD-10-CM | POA: Diagnosis present

## 2021-02-21 DIAGNOSIS — F0390 Unspecified dementia without behavioral disturbance: Secondary | ICD-10-CM | POA: Diagnosis present

## 2021-02-21 DIAGNOSIS — I482 Chronic atrial fibrillation, unspecified: Secondary | ICD-10-CM | POA: Diagnosis present

## 2021-02-21 DIAGNOSIS — E861 Hypovolemia: Secondary | ICD-10-CM | POA: Diagnosis not present

## 2021-02-21 DIAGNOSIS — R4182 Altered mental status, unspecified: Secondary | ICD-10-CM | POA: Diagnosis present

## 2021-02-21 DIAGNOSIS — N400 Enlarged prostate without lower urinary tract symptoms: Secondary | ICD-10-CM | POA: Diagnosis present

## 2021-02-21 DIAGNOSIS — I428 Other cardiomyopathies: Secondary | ICD-10-CM | POA: Diagnosis present

## 2021-02-21 DIAGNOSIS — A419 Sepsis, unspecified organism: Secondary | ICD-10-CM | POA: Diagnosis present

## 2021-02-21 DIAGNOSIS — G40909 Epilepsy, unspecified, not intractable, without status epilepticus: Secondary | ICD-10-CM | POA: Diagnosis present

## 2021-02-21 DIAGNOSIS — I5022 Chronic systolic (congestive) heart failure: Secondary | ICD-10-CM | POA: Diagnosis present

## 2021-02-21 DIAGNOSIS — Z20822 Contact with and (suspected) exposure to covid-19: Secondary | ICD-10-CM | POA: Diagnosis present

## 2021-02-21 DIAGNOSIS — E785 Hyperlipidemia, unspecified: Secondary | ICD-10-CM | POA: Diagnosis present

## 2021-02-21 DIAGNOSIS — H5462 Unqualified visual loss, left eye, normal vision right eye: Secondary | ICD-10-CM | POA: Diagnosis present

## 2021-02-21 DIAGNOSIS — I9589 Other hypotension: Secondary | ICD-10-CM | POA: Diagnosis present

## 2021-02-21 DIAGNOSIS — N179 Acute kidney failure, unspecified: Secondary | ICD-10-CM | POA: Diagnosis present

## 2021-02-21 DIAGNOSIS — J449 Chronic obstructive pulmonary disease, unspecified: Secondary | ICD-10-CM | POA: Diagnosis present

## 2021-02-21 DIAGNOSIS — N390 Urinary tract infection, site not specified: Secondary | ICD-10-CM | POA: Diagnosis present

## 2021-02-21 DIAGNOSIS — G9341 Metabolic encephalopathy: Secondary | ICD-10-CM | POA: Diagnosis present

## 2021-02-21 DIAGNOSIS — K219 Gastro-esophageal reflux disease without esophagitis: Secondary | ICD-10-CM | POA: Diagnosis present

## 2021-02-21 DIAGNOSIS — F32A Depression, unspecified: Secondary | ICD-10-CM | POA: Diagnosis present

## 2021-02-21 DIAGNOSIS — I110 Hypertensive heart disease with heart failure: Secondary | ICD-10-CM | POA: Diagnosis present

## 2021-02-21 DIAGNOSIS — G459 Transient cerebral ischemic attack, unspecified: Secondary | ICD-10-CM | POA: Diagnosis present

## 2021-02-21 DIAGNOSIS — I2510 Atherosclerotic heart disease of native coronary artery without angina pectoris: Secondary | ICD-10-CM | POA: Diagnosis present

## 2021-02-21 DIAGNOSIS — I10 Essential (primary) hypertension: Secondary | ICD-10-CM | POA: Diagnosis not present

## 2021-02-21 DIAGNOSIS — G8929 Other chronic pain: Secondary | ICD-10-CM | POA: Diagnosis present

## 2021-02-21 DIAGNOSIS — I739 Peripheral vascular disease, unspecified: Secondary | ICD-10-CM | POA: Diagnosis present

## 2021-02-21 DIAGNOSIS — E46 Unspecified protein-calorie malnutrition: Secondary | ICD-10-CM

## 2021-02-21 DIAGNOSIS — I700 Atherosclerosis of aorta: Secondary | ICD-10-CM | POA: Diagnosis present

## 2021-02-21 LAB — BASIC METABOLIC PANEL
Anion gap: 8 (ref 5–15)
BUN: 40 mg/dL — ABNORMAL HIGH (ref 8–23)
CO2: 20 mmol/L — ABNORMAL LOW (ref 22–32)
Calcium: 8.7 mg/dL — ABNORMAL LOW (ref 8.9–10.3)
Chloride: 107 mmol/L (ref 98–111)
Creatinine, Ser: 1.43 mg/dL — ABNORMAL HIGH (ref 0.61–1.24)
GFR, Estimated: 45 mL/min — ABNORMAL LOW (ref >60)
Glucose, Bld: 106 mg/dL — ABNORMAL HIGH (ref 70–99)
Potassium: 5.3 mmol/L — ABNORMAL HIGH (ref 3.5–5.1)
Sodium: 135 mmol/L (ref 135–145)

## 2021-02-21 LAB — PROCALCITONIN: Procalcitonin: <0.1 ng/mL

## 2021-02-21 LAB — COMPREHENSIVE METABOLIC PANEL
ALT: 10 U/L (ref 0–44)
AST: 28 U/L (ref 15–41)
Albumin: 3 g/dL — ABNORMAL LOW (ref 3.5–5.0)
Alkaline Phosphatase: 148 U/L — ABNORMAL HIGH (ref 38–126)
Anion gap: 6 (ref 5–15)
BUN: 44 mg/dL — ABNORMAL HIGH (ref 8–23)
CO2: 22 mmol/L (ref 22–32)
Calcium: 8.6 mg/dL — ABNORMAL LOW (ref 8.9–10.3)
Chloride: 107 mmol/L (ref 98–111)
Creatinine, Ser: 1.69 mg/dL — ABNORMAL HIGH (ref 0.61–1.24)
GFR, Estimated: 36 mL/min — ABNORMAL LOW (ref >60)
Glucose, Bld: 102 mg/dL — ABNORMAL HIGH (ref 70–99)
Potassium: 5.7 mmol/L — ABNORMAL HIGH (ref 3.5–5.1)
Sodium: 135 mmol/L (ref 135–145)
Total Bilirubin: 0.9 mg/dL (ref 0.3–1.2)
Total Protein: 6.1 g/dL — ABNORMAL LOW (ref 6.5–8.1)

## 2021-02-21 LAB — CBC WITH DIFFERENTIAL/PLATELET
Abs Immature Granulocytes: 0.01 10*3/uL (ref 0.00–0.07)
Basophils Absolute: 0 10*3/uL (ref 0.0–0.1)
Basophils Relative: 0 %
Eosinophils Absolute: 0.1 10*3/uL (ref 0.0–0.5)
Eosinophils Relative: 2 %
HCT: 31.8 % — ABNORMAL LOW (ref 39.0–52.0)
Hemoglobin: 9.7 g/dL — ABNORMAL LOW (ref 13.0–17.0)
Immature Granulocytes: 0 %
Lymphocytes Relative: 30 %
Lymphs Abs: 1.2 10*3/uL (ref 0.7–4.0)
MCH: 26.9 pg (ref 26.0–34.0)
MCHC: 30.5 g/dL (ref 30.0–36.0)
MCV: 88.1 fL (ref 80.0–100.0)
Monocytes Absolute: 0.3 10*3/uL (ref 0.1–1.0)
Monocytes Relative: 8 %
Neutro Abs: 2.3 10*3/uL (ref 1.7–7.7)
Neutrophils Relative %: 60 %
Platelets: 122 10*3/uL — ABNORMAL LOW (ref 150–400)
RBC: 3.61 MIL/uL — ABNORMAL LOW (ref 4.22–5.81)
RDW: 16.5 % — ABNORMAL HIGH (ref 11.5–15.5)
WBC: 3.9 10*3/uL — ABNORMAL LOW (ref 4.0–10.5)
nRBC: 0 % (ref 0.0–0.2)

## 2021-02-21 LAB — TSH: TSH: 1.159 u[IU]/mL (ref 0.350–4.500)

## 2021-02-21 LAB — AMMONIA: Ammonia: 16 umol/L (ref 9–35)

## 2021-02-21 LAB — VITAMIN D 25 HYDROXY (VIT D DEFICIENCY, FRACTURES): Vit D, 25-Hydroxy: 25.01 ng/mL — ABNORMAL LOW (ref 30–100)

## 2021-02-21 LAB — PROTIME-INR
INR: 1.2 (ref 0.8–1.2)
Prothrombin Time: 15.1 seconds (ref 11.4–15.2)

## 2021-02-21 LAB — CORTISOL-AM, BLOOD: Cortisol - AM: 8.1 ug/dL (ref 6.7–22.6)

## 2021-02-21 LAB — MAGNESIUM: Magnesium: 2.3 mg/dL (ref 1.7–2.4)

## 2021-02-21 MED ORDER — DONEPEZIL HCL 5 MG PO TABS
10.0000 mg | ORAL_TABLET | Freq: Every day | ORAL | Status: DC
  Administered 2021-02-21: 10 mg via ORAL
  Filled 2021-02-21: qty 2

## 2021-02-21 MED ORDER — SIMVASTATIN 20 MG PO TABS
10.0000 mg | ORAL_TABLET | Freq: Every day | ORAL | Status: DC
  Administered 2021-02-21: 10 mg via ORAL
  Filled 2021-02-21: qty 1

## 2021-02-21 MED ORDER — ACETAMINOPHEN 325 MG PO TABS
650.0000 mg | ORAL_TABLET | Freq: Four times a day (QID) | ORAL | Status: DC | PRN
  Administered 2021-02-21: 650 mg via ORAL
  Filled 2021-02-21: qty 2

## 2021-02-21 MED ORDER — ONDANSETRON HCL 4 MG PO TABS: 4.0000 mg | ORAL_TABLET | Freq: Four times a day (QID) | ORAL | Status: DC | PRN

## 2021-02-21 MED ORDER — ACETAMINOPHEN 650 MG RE SUPP: 650.0000 mg | Freq: Four times a day (QID) | RECTAL | Status: DC | PRN

## 2021-02-21 MED ORDER — ASPIRIN EC 81 MG PO TBEC
81.0000 mg | DELAYED_RELEASE_TABLET | Freq: Every day | ORAL | Status: DC
  Administered 2021-02-21 – 2021-02-22 (×2): 81 mg via ORAL
  Filled 2021-02-21 (×2): qty 1

## 2021-02-21 MED ORDER — HYDROCORTISONE SOD SUC (PF) 100 MG IJ SOLR
100.0000 mg | Freq: Two times a day (BID) | INTRAMUSCULAR | Status: DC
  Administered 2021-02-21 – 2021-02-22 (×3): 100 mg via INTRAVENOUS
  Filled 2021-02-21 (×3): qty 2

## 2021-02-21 MED ORDER — TAMSULOSIN HCL 0.4 MG PO CAPS
0.4000 mg | ORAL_CAPSULE | Freq: Every day | ORAL | Status: DC
  Administered 2021-02-21 – 2021-02-22 (×2): 0.4 mg via ORAL
  Filled 2021-02-21 (×2): qty 1

## 2021-02-21 MED ORDER — INFLUENZA VAC A&B SA ADJ QUAD 0.5 ML IM PRSY: 0.5000 mL | PREFILLED_SYRINGE | INTRAMUSCULAR | Status: DC

## 2021-02-21 MED ORDER — SERTRALINE HCL 50 MG PO TABS
50.0000 mg | ORAL_TABLET | Freq: Every day | ORAL | Status: DC
  Administered 2021-02-21 – 2021-02-22 (×2): 50 mg via ORAL
  Filled 2021-02-21 (×2): qty 1

## 2021-02-21 MED ORDER — APIXABAN 2.5 MG PO TABS
2.5000 mg | ORAL_TABLET | Freq: Two times a day (BID) | ORAL | Status: DC
  Administered 2021-02-21 – 2021-02-22 (×3): 2.5 mg via ORAL
  Filled 2021-02-21 (×3): qty 1

## 2021-02-21 MED ORDER — MORPHINE SULFATE (PF) 2 MG/ML IV SOLN: 2.0000 mg | INTRAVENOUS | Status: DC | PRN

## 2021-02-21 MED ORDER — ONDANSETRON HCL 4 MG/2ML IJ SOLN: 4.0000 mg | Freq: Four times a day (QID) | INTRAMUSCULAR | Status: DC | PRN

## 2021-02-21 MED ORDER — GABAPENTIN 300 MG PO CAPS
300.0000 mg | ORAL_CAPSULE | Freq: Every day | ORAL | Status: DC
  Administered 2021-02-21: 300 mg via ORAL
  Filled 2021-02-21: qty 1

## 2021-02-21 MED ORDER — MIRABEGRON ER 25 MG PO TB24
25.0000 mg | ORAL_TABLET | Freq: Every day | ORAL | Status: DC
  Administered 2021-02-21 – 2021-02-22 (×2): 25 mg via ORAL
  Filled 2021-02-21 (×2): qty 1

## 2021-02-21 MED ORDER — LEVETIRACETAM 250 MG PO TABS
250.0000 mg | ORAL_TABLET | Freq: Two times a day (BID) | ORAL | Status: DC
  Administered 2021-02-21 – 2021-02-22 (×3): 250 mg via ORAL
  Filled 2021-02-21 (×3): qty 1

## 2021-02-21 MED ORDER — ALBUTEROL SULFATE (2.5 MG/3ML) 0.083% IN NEBU: 2.5000 mg | INHALATION_SOLUTION | Freq: Four times a day (QID) | RESPIRATORY_TRACT | Status: DC | PRN

## 2021-02-21 MED ORDER — LACTATED RINGERS IV SOLN: INTRAVENOUS | Status: AC

## 2021-02-21 NOTE — H&P (Signed)
TRH H&P    Patient Demographics:    Scott [name], is a 87 y.o. male  MRN: [id number]  DOB - May 30, 1921  Admit Date - 02/20/2021  Referring MD/NP/PA: Eulis Foster  Outpatient Primary MD for the patient is Dettinger, Fransisca Kaufmann, MD  Patient coming from: Home  Chief complaint- Altered mental status   HPI:    Scott [name]  is a 87 y.o. male, with history of atrial fibrillation, BPH, bladder cancer, COPD, CAD, GERD, hyperlipidemia, hypertension, peripheral artery disease, CHF, and more presents the ED with a chief complaint of altered mental status.  Patient is pretty drowsy, although responsive at the time of my exam.  He wakes to light touch, is oriented to self, and then falls back to sleep.  Son is at bedside.  Most of the history comes from him, and chart review.  Son reports that patient lives by himself but has 24-hour caregivers.  Caregiver noticed that patient had become unresponsive, weak, and drooling, so she called EMS.  When EMS got there her blood pressure was 80s over 40s.  They advise going to the hospital but family decided to try to manage patient at home.  They encouraged him to have p.o. intake.  At baseline patient can feed himself, but today could not do it.  His appetite did seem to be intact.  They again noticed him to become unresponsive and called EMS again.  At that time his blood pressure was in the 60s.  Decision was made to transport to the hospital.  Son reports that couple of days ago patient was complaining of neck pain which is not normal for him.  Son also reports that he has episodes of becoming less responsive, and over the last 6 months has had a general decline, but he was improves.  His blood pressure sometimes goes low but it always comes back.  Patient is on several blood pressure medications including Coreg, losartan, Lasix, Flomax.  Son is concerned that these medications need to be titrated  down.  At baseline patient ambulates in a wheelchair.  He can transfer himself from wheelchair to commode and back.  He can hold a normal conversation.  He recognizes family.  He does have a history of bladder cancer.  Son does not think that patient has had a change in his urinary habits.  He has not had any fever.  Patient does not smoke, does not drink.  He is vaccinated for COVID.  Patient is DNR.  In the ED Temp 98.7, heart rate 29-80, respiratory 14-17, blood pressure 86/55-100/65.  Patient was given a 500 bolus in route to the ED.  He was given 30 mL/kg bolus in the ED.  Blood cultures and urine cultures drawn.  Chest x-ray shows trace pleural effusion.  Patient has borderline leukopenia at 4.3, AKI with a creatinine of 1.9.  EKG shows junctional rhythm, heart rate 82, QTc 500.  Patient started on vancomycin, aztreonam, cefepime.  UA is borderline for UTI, but this is likely the source.    Review of systems:  Unfortunately patient cannot provide review of systems due to altered mental status    Past History of the following :    Past Medical History:  Diagnosis Date   Arthritis    "all over"   Atrial fibrillation ([place])    Benign localized prostatic hyperplasia with lower urinary tract symptoms (LUTS)    Bradycardia 11/28/2015   Severe-HR in the 20s to 30s following intubation for urological procedure.   CAD- non obstructive disease by cath 3/14 cardiologist-  dr hochrein   Chronic lower back pain    COPD (chronic obstructive pulmonary disease) ([place])    DDD (degenerative disc disease)    Depression    Dyspnea    w/ exertion and lying down (raise head)   ETOH abuse    GERD (gastroesophageal reflux disease)    Glaucoma    Glaucoma, both eyes    History of bladder cancer urologist-- dr Jeffie Pollock   first dx 2012--  s/p TURBT's   History of DVT of lower extremity 03/2013   bilateral    History of pulmonary embolus (PE) 04/2013   HTN (hypertension)    Hyperlipidemia     Hypertension    LBBB (left bundle branch block)    chronic   Non-ischemic cardiomyopathy ([place])    last echo 01-18-2017, ef 35-40%   NSVT (nonsustained ventricular tachycardia)    a. NSVT 06/2012; NSVT also seen during 03/2013 adm. b. Med rx. Not candidate for ICD given adv age.   PAD (peripheral artery disease) ([facility])    Popliteal artery aneurysm, bilateral ([facility])    DOCUMENTED CHRONIC PARTIAL OCCLUSION--  PT DENIES CLAUDICATION OR ANY OTHER SYMPTOMS   PVCs (premature ventricular contractions)    PVD (peripheral vascular disease) ([facility])    Right ABI .75, Left .78 (2006)   Syncope 06/2012   a. Felt to be postural syncope related to diuretics 06/2012.   Systolic and diastolic CHF, chronic ([facility]CC) cardiologsit-  dr hochrein   a. NICM - patent cors 06/2012, EF 40% at that time. b. 03/2013 eval: EF 20-25%.   Vision loss, left eye    due to glaucoma   Wears glasses       Past Surgical History:  Procedure Laterality Date   AMPUTATION Left 10/16/2014   Procedure: AMPUTATION ABOVE KNEE- LEFT;  Surgeon: Mal Misty, MD;  Location: [facility];  Service: Vascular;  Laterality: Left;   CARDIAC CATHETERIZATION  05-11-2004  DR [name] PLAQUE/ NORMAL LVF/ EF 55%/  NON-OBSTRUCTIVE LAD 25%   CARDIAC CATHETERIZATION  06/18/2012   dr hochrein   mild luminal irregularities of coronaries, ef 45-50%   CARDIOVASCULAR STRESS TEST  10-15-2010   LOW RISK NUCLEAR STUDY/ NO EVIDENCE OF ISCHEMIA/ NORMAL EF   CATARACT EXTRACTION W/ INTRAOCULAR LENS  IMPLANT, BILATERAL Bilateral    CYSTOSCOPY  10/07/2011   Procedure: CYSTOSCOPY;  Surgeon: Malka So, MD;  Location: [facility];  Service: Urology;  Laterality: N/A;   CYSTOSCOPY N/A 10/20/2018   Procedure: CYSTOSCOPY WITH FULGURATION OF PROSTATIC URETHRAL TUMOR;  Surgeon: [name], MD;  Location: [facility];  Service: Urology;  Laterality: N/A;   CYSTOSCOPY N/A 10/11/2019   Procedure: CYSTOSCOPY;  Surgeon: Cleon Gustin, MD;  Location:  [facility];  Service: Urology;  Laterality: N/A;   CYSTOSCOPY W/ RETROGRADES Bilateral 03/04/2020   Procedure: CYSTOSCOPY WITH BILATERAL RETROGRADE;  Surgeon: [name], MD;  Location[facility];  Service: Urology;  Laterality: Bilateral;   CYSTOSCOPY WITH BIOPSY  03/14/2012  Procedure: CYSTOSCOPY WITH BIOPSY;  Surgeon: Malka So, MD;  [facility];  Service: Urology;  Laterality: N/A;  WITH FULGURATION   CYSTOSCOPY WITH BIOPSY N/A 12/09/2016   Procedure: CYSTOSCOPY WITH BIOPSY AND FULGURATION;  Surgeon: [name], MD;  [facility];  Service: Urology;  Laterality: N/A;   CYSTOSCOPY WITH BIOPSY N/A 12/20/2017   Procedure: CYSTOSCOPY WITH BIOPSY WITH FULGURATION;  Surgeon: [name], MD;  [facility];  Service: Urology;  Laterality: N/A;   CYSTOSCOPY WITH BIOPSY N/A 06/09/2018   Procedure: CYSTOSCOPY WITH BIOPSY AND FULGURATION;  Surgeon: [name], MD;  Location[facility];  Service: Urology;  Laterality: N/A;   CYSTOSCOPY WITH FULGERATION N/A 01/20/2016   Procedure: CYSTOSCOPY, BIOPSY WITH FULGERATION OF URTHRAL TUMOR;  Surgeon: [name], MD;  [facility];  Service: Urology;  Laterality: N/A;   CYSTOSCOPY WITH FULGERATION N/A 06/01/2016   Procedure: CYSTOSCOPY WITH FULGERATION urethral tumors and bladder neck;  Surgeon: [name], MD;  [facility];  Service: Urology;  Laterality: N/A;   SHOULDER SURGERY Left 1970's   TONSILLECTOMY     TRANSTHORACIC ECHOCARDIOGRAM  01-18-2017   dr hochrein   moderate LVH, ef 35-40%, diffuse hypokinesis/  trivial AR and MR/  mild TR   TRANSURETHRAL RESECTION OF BLADDER TUMOR  10/07/2011   Procedure: TRANSURETHRAL RESECTION OF BLADDER TUMOR (TURBT);  Surgeon: Malka So, MD;  Location: [facility];  Service: Urology;  Laterality: N/A;   TRANSURETHRAL RESECTION OF BLADDER TUMOR N/A 07/10/2015   Procedure: TRANSURETHRAL RESECTION OF BLADDER TUMOR (TURBT), CYSTOSCOPY ;  Surgeon: [name], MD;  [facility];  Service:  Urology;  Laterality: N/A;   TRANSURETHRAL RESECTION OF BLADDER TUMOR N/A 10/11/2019   Procedure: TRANSURETHRAL RESECTION OF PROSTATIC URETHRAL TUMOR;  Surgeon: Cleon Gustin, MD;  Location[facility];  Service: Urology;  Laterality: N/A;   TRANSURETHRAL RESECTION OF BLADDER TUMOR WITH MITOMYCIN-C N/A 03/04/2020   Procedure: TRANSURETHRAL RESECTION OF BLADDER TUMOR WITH GEMCITABINE;  Surgeon: [name], MD;  [facility];  Service: Urology;  Laterality: N/A;   TRANSURETHRAL RESECTION OF PROSTATE  03/14/2012   Procedure: TRANSURETHRAL RESECTION OF THE PROSTATE WITH GYRUS INSTRUMENTS;  Surgeon: Malka So, MD;  [facility];  Service: Urology;  Laterality: N/A;      Social History:      Social History   Tobacco Use   Smoking status: Former    Packs/day: 1.00    Years: 35.00    Pack years: 35.00    Types: Cigarettes    Start date: 04/12/1942    Quit date: 04/13/1975    Years since quitting: 45.8   Smokeless tobacco: Never  Substance Use Topics   Alcohol use: Not Currently    Alcohol/week: 7.0 standard drinks    Types: 7 Cans of beer per week       Family History :     Family History  Problem Relation Age of Onset   Hypertension Mother    Arthritis Mother    Lung cancer Sister    Deep vein thrombosis Father    Early death Brother       Home Medications:   Prior to Admission medications   Medication Sig Start Date End Date Taking? Authorizing Provider  acetaminophen (TYLENOL) 500 MG tablet Take 1,000 mg by mouth every 6 (six) hours as needed for moderate pain or headache.    [provider]  albuterol (PROVENTIL) (2.5 MG/3ML) 0.083% nebulizer solution Take 3 mLs (  2.5 mg total) by nebulization every 6 (six) hours as needed for wheezing or shortness of breath. 09/04/20   Dettinger, Fransisca Kaufmann, MD  albuterol (VENTOLIN HFA) 108 (90 Base) MCG/ACT inhaler Inhale 2 puffs into the lungs every 6 (six) hours as needed for wheezing or shortness of breath. 09/26/20    Dettinger, Fransisca Kaufmann, MD  aspirin EC 81 MG EC tablet Take 1 tablet (81 mg total) by mouth daily. Swallow whole. 08/20/20   Manuella Ghazi, Pratik D, DO  carvedilol (COREG) 6.25 MG tablet Take 1 tablet (6.25 mg total) by mouth 2 (two) times daily with a meal. 07/21/20   Dettinger, Fransisca Kaufmann, MD  donepezil (ARICEPT) 10 MG tablet Take 1 tablet (10 mg total) by mouth at bedtime. 07/21/20   Dettinger, Fransisca Kaufmann, MD  ELIQUIS 2.5 MG TABS tablet Take 2.5 mg by mouth 2 (two) times daily. 01/02/21   [provider]  febuxostat (ULORIC) 40 MG tablet Take 1 tablet (40 mg total) by mouth daily. 07/21/20   Dettinger, Fransisca Kaufmann, MD  fluticasone (FLONASE) 50 MCG/ACT nasal spray Place 1 spray into both nostrils 2 (two) times daily as needed for allergies or rhinitis. SPRAY 1 SPRAY IN EACH NOSTRIL ONCE DAILY. 09/26/20   Dettinger, Fransisca Kaufmann, MD  furosemide (LASIX) 20 MG tablet TAKE 1 TABLET DAILY AS NEEDED (signs of volume overload). 09/03/20   Minus Breeding, MD  gabapentin (NEURONTIN) 400 MG capsule TAKE 1 CAPSULE 2 TIMES A DAY 02/05/21   Dettinger, Fransisca Kaufmann, MD  Ipratropium-Albuterol (COMBIVENT RESPIMAT) 20-100 MCG/ACT AERS respimat Inhale 1 puff into the lungs every 6 (six) hours as needed for wheezing or shortness of breath (As needed for wheezing , cough or shortness of breath). 09/26/20   Dettinger, Fransisca Kaufmann, MD  levETIRAcetam (KEPPRA) 250 MG tablet TAKE ONE TABLET TWICE DAILY 01/28/21   Dettinger, Fransisca Kaufmann, MD  levofloxacin (LEVAQUIN) 500 MG tablet Take 1 tablet by mouth daily. 01/07/21   [provider]  losartan (COZAAR) 50 MG tablet TAKE ONE TABLET ONCE DAILY 01/28/21   Dettinger, Fransisca Kaufmann, MD  MYRBETRIQ 25 MG TB24 tablet Take 1 tablet (25 mg total) by mouth daily. 12/03/20   McKenzie, Candee Furbish, MD  pantoprazole (PROTONIX) 40 MG tablet Take 1 tablet (40 mg total) by mouth daily. 07/21/20   Dettinger, Fransisca Kaufmann, MD  sertraline (ZOLOFT) 50 MG tablet Take 1 tablet (50 mg total) by mouth daily. 07/21/20   Dettinger,  Fransisca Kaufmann, MD  simvastatin (ZOCOR) 10 MG tablet Take 1 tablet (10 mg total) by mouth daily at 6 PM. 07/21/20   Dettinger, Fransisca Kaufmann, MD  tamsulosin (FLOMAX) 0.4 MG CAPS capsule TAKE 1 CAPSULE DAILY 02/18/21   Dettinger, Fransisca Kaufmann, MD     Allergies:     Allergies  Allergen Reactions   Atorvastatin Hives and Rash   Rocephin [Ceftriaxone Sodium In Dextrose] Other (See Comments)    Recurrent seizures     Physical Exam:   Vitals  Blood pressure 100/63, pulse 76, temperature 98.7 F (37.1 C), temperature source Rectal, resp. rate 14, height 5\' 6"  (1.676 m), weight 73 kg, SpO2 97 %.   1.  General: Patient lying supine in bed,  no acute distress   2. Psychiatric: Drowsy and oriented x 1,    3. Neurologic: Speech and language are normal, face is symmetric, says "uh huh" when asked to perform a command, but then does not follow the command.  Patient is just too drowsy.   4. HEENMT:  Head  is atraumatic, normocephalic, pupils reactive to light, neck is supple, trachea is midline, mucous membranes are moist   5. Respiratory : Lungs are clear to auscultation bilaterally without wheezing, rhonchi, rales, no cyanosis, no increase in work of breathing or accessory muscle use   6. Cardiovascular : Heart rate normal, rhythm is regular, no murmurs, rubs or gallops, no peripheral edema, peripheral pulses palpated   7. Gastrointestinal:  Abdomen is soft, nondistended, nontender to palpation bowel sounds active, no masses or organomegaly palpated   8. Skin:  Skin is warm, dry and intact without rashes, acute lesions, or ulcers on limited exam   9.Musculoskeletal:  No acute deformities or trauma, no asymmetry in tone, no peripheral edema, peripheral pulses palpated, no tenderness to palpation in the extremities     Data Review:    CBC Recent Labs  Lab 02/20/21 1900  WBC 4.3  HGB 10.3*  HCT 32.8*  PLT 178  MCV 87.0  MCH 27.3  MCHC 31.4  RDW 16.5*  LYMPHSABS 1.4  MONOABS 0.4   EOSABS 0.1  BASOSABS 0.0   ------------------------------------------------------------------------------------------------------------------  Results for orders placed or performed during the hospital encounter of 02/20/21 (from the past 48 hour(s))  CBG monitoring, ED     Status: Abnormal   Collection Time: 02/20/21  6:56 PM  Result Value Ref Range   Glucose-Capillary 110 (H) 70 - 99 mg/dL    Comment: Glucose reference range applies only to samples taken after fasting for at least 8 hours.  Lactic acid, plasma     Status: None   Collection Time: 02/20/21  7:00 PM  Result Value Ref Range   Lactic Acid, Venous 1.3 0.5 - 1.9 mmol/L    Comment: Performed at [facility], [address] [place], [place] [address]  Comprehensive metabolic panel     Status: Abnormal   Collection Time: 02/20/21  7:00 PM  Result Value Ref Range   Sodium 137 135 - 145 mmol/L   Potassium 4.1 3.5 - 5.1 mmol/L   Chloride 105 98 - 111 mmol/L   CO2 26 22 - 32 mmol/L   Glucose, Bld 110 (H) 70 - 99 mg/dL    Comment: Glucose reference range applies only to samples taken after fasting for at least 8 hours.   BUN 48 (H) 8 - 23 mg/dL   Creatinine, Ser 1.89 (H) 0.61 - 1.24 mg/dL   Calcium 8.6 (L) 8.9 - 10.3 mg/dL   Total Protein 6.5 6.5 - 8.1 g/dL   Albumin 3.3 (L) 3.5 - 5.0 g/dL   AST 16 15 - 41 U/L   ALT 10 0 - 44 U/L   Alkaline Phosphatase 159 (H) 38 - 126 U/L   Total Bilirubin 0.2 (L) 0.3 - 1.2 mg/dL   GFR, Estimated 32 (L) >60 mL/min    Comment: (NOTE) Calculated using the CKD-EPI Creatinine Equation (2021)    Anion gap 6 5 - 15    Comment: Performed at [facility], [address] [place], [place] [address]  CBC WITH DIFFERENTIAL     Status: Abnormal   Collection Time: 02/20/21  7:00 PM  Result Value Ref Range   WBC 4.3 4.0 - 10.5 K/uL   RBC 3.77 (L) 4.22 - 5.81 MIL/uL   Hemoglobin 10.3 (L) 13.0 - 17.0 g/dL   HCT 32.8 (L) 39.0 - 52.0 %   MCV 87.0 80.0 - 100.0 fL   MCH 27.3 26.0 - 34.0 pg   MCHC  31.4 30.0 - 36.0 g/dL  RDW 16.5 (H) 11.5 - 15.5 %   Platelets 178 150 - 400 K/uL   nRBC 0.0 0.0 - 0.2 %   Neutrophils Relative % 52 %   Neutro Abs 2.3 1.7 - 7.7 K/uL   Lymphocytes Relative 34 %   Lymphs Abs 1.4 0.7 - 4.0 K/uL   Monocytes Relative 10 %   Monocytes Absolute 0.4 0.1 - 1.0 K/uL   Eosinophils Relative 3 %   Eosinophils Absolute 0.1 0.0 - 0.5 K/uL   Basophils Relative 1 %   Basophils Absolute 0.0 0.0 - 0.1 K/uL   Immature Granulocytes 0 %   Abs Immature Granulocytes 0.00 0.00 - 0.07 K/uL    Comment: Performed at [facility], [address], [place], [place] [address]  Protime-INR     Status: None   Collection Time: 02/20/21  7:00 PM  Result Value Ref Range   Prothrombin Time 15.2 11.4 - 15.2 seconds   INR 1.2 0.8 - 1.2    Comment: (NOTE) INR goal varies based on device and disease states. Performed at [facility], [address] [place], [place] [address]   APTT     Status: None   Collection Time: 02/20/21  7:00 PM  Result Value Ref Range   aPTT 29 24 - 36 seconds    Comment: Performed at [facility], [address] [place], [place] [address]  Blood Culture (routine x 2)     Status: None (Preliminary result)   Collection Time: 02/20/21  7:00 PM   Specimen: Right Antecubital; Blood  Result Value Ref Range   Specimen Description RIGHT ANTECUBITAL    Special Requests      Blood Culture adequate volume BOTTLES DRAWN AEROBIC AND ANAEROBIC Performed at [facility], [address], [place], [place] [address]    Culture PENDING    Report Status PENDING   Resp Panel by RT-PCR (Flu A&B, Covid) Nasopharyngeal Swab     Status: None   Collection Time: 02/20/21  7:04 PM   Specimen: Nasopharyngeal Swab; Nasopharyngeal(NP) swabs in vial transport medium  Result Value Ref Range   SARS Coronavirus 2 by RT PCR NEGATIVE NEGATIVE    Comment: (NOTE) SARS-CoV-2 target nucleic acids are NOT DETECTED.  The SARS-CoV-2 RNA is generally detectable in upper respiratory specimens  during the acute phase of infection. The lowest concentration of SARS-CoV-2 viral copies this assay can detect is 138 copies/mL. A negative result does not preclude SARS-Cov-2 infection and should not be used as the sole basis for treatment or other patient management decisions. A negative result may occur with  improper specimen collection/handling, submission of specimen other than nasopharyngeal swab, presence of viral mutation(s) within the areas targeted by this assay, and inadequate number of viral copies(<138 copies/mL). A negative result must be combined with clinical observations, patient history, and epidemiological information. The expected result is Negative.  Fact Sheet for Patients:  EntrepreneurPulse.com.au  Fact Sheet for Healthcare Providers:  IncredibleEmployment.be  This test is no t yet approved or cleared by the [place] FDA and  has been authorized for detection and/or diagnosis of SARS-CoV-2 by FDA under an Emergency Use Authorization (EUA). This EUA will remain  in effect (meaning this test can be used) for the duration of the COVID-19 declaration under Section 564(b)(1) of the Act, 21 U.S.C.section 360bbb-3(b)(1), unless the authorization is terminated  or revoked sooner.       Influenza A by PCR NEGATIVE NEGATIVE   Influenza B by PCR NEGATIVE NEGATIVE  Comment: (NOTE) The Xpert Xpress SARS-CoV-2/FLU/RSV plus assay is intended as an aid in the diagnosis of influenza from Nasopharyngeal swab specimens and should not be used as a sole basis for treatment. Nasal washings and aspirates are unacceptable for Xpert Xpress SARS-CoV-2/FLU/RSV testing.  Fact Sheet for Patients: EntrepreneurPulse.com.au  Fact Sheet for Healthcare Providers: IncredibleEmployment.be  This test is not yet approved or cleared by the [place] FDA and has been authorized for detection and/or diagnosis  of SARS-CoV-2 by FDA under an Emergency Use Authorization (EUA). This EUA will remain in effect (meaning this test can be used) for the duration of the COVID-19 declaration under Section 564(b)(1) of the Act, 21 U.S.C. section 360bbb-3(b)(1), unless the authorization is terminated or revoked.  Performed at [facility], [address] [place], [place] [address]   Blood Culture (routine x 2)     Status: None (Preliminary result)   Collection Time: 02/20/21  7:08 PM   Specimen: BLOOD LEFT ARM  Result Value Ref Range   Specimen Description BLOOD LEFT ARM    Special Requests      Blood Culture results may not be optimal due to an inadequate volume of blood received in culture bottles BOTTLES DRAWN AEROBIC AND ANAEROBIC Performed at [facility], [address], [place], [place] [address]    Culture PENDING    Report Status PENDING   Lactic acid, plasma     Status: None   Collection Time: 02/20/21  8:49 PM  Result Value Ref Range   Lactic Acid, Venous 1.9 0.5 - 1.9 mmol/L    Comment: Performed at [facility], [address] [place], [place] [address]  Urinalysis, Routine w reflex microscopic     Status: Abnormal   Collection Time: 02/20/21 10:53 PM  Result Value Ref Range   Color, Urine YELLOW YELLOW   APPearance HAZY (A) CLEAR   Specific Gravity, Urine 1.010 1.005 - 1.030   pH 5.0 5.0 - 8.0   Glucose, UA NEGATIVE NEGATIVE mg/dL   Hgb urine dipstick LARGE (A) NEGATIVE   Bilirubin Urine NEGATIVE NEGATIVE   Ketones, ur NEGATIVE NEGATIVE mg/dL   Protein, ur 100 (A) NEGATIVE mg/dL   Nitrite NEGATIVE NEGATIVE   Leukocytes,Ua NEGATIVE NEGATIVE   RBC / HPF >50 (H) 0 - 5 RBC/hpf   WBC, UA 11-20 0 - 5 WBC/hpf   Bacteria, UA NONE SEEN NONE SEEN   Squamous Epithelial / LPF 0-5 0 - 5   Mucus PRESENT    Hyaline Casts, UA PRESENT     Comment: Performed at [facility], [address] [place], [place] [address]   *Note: Due to a large number of results and/or encounters for the requested  time period, some results have not been displayed. A complete set of results can be found in Results Review.    Chemistries  Recent Labs  Lab 02/20/21 1900  NA 137  K 4.1  CL 105  CO2 26  GLUCOSE 110*  BUN 48*  CREATININE 1.89*  CALCIUM 8.6*  AST 16  ALT 10  ALKPHOS 159*  BILITOT 0.2*   ------------------------------------------------------------------------------------------------------------------  ------------------------------------------------------------------------------------------------------------------ GFR: Estimated Creatinine Clearance: 20.2 mL/min (A) (by C-G formula based on SCr of 1.89 mg/dL (H)). Liver Function Tests: Recent Labs  Lab 02/20/21 1900  AST 16  ALT 10  ALKPHOS 159*  BILITOT 0.2*  PROT 6.5  ALBUMIN 3.3*   No results for input(s): LIPASE, AMYLASE in the last 168 hours. No results for input(s): AMMONIA in the last 168 hours.  Coagulation Profile: Recent Labs  Lab 02/20/21 1900  INR 1.2   Cardiac Enzymes: No results for input(s): CKTOTAL, CKMB, CKMBINDEX, TROPONINI in the last 168 hours. BNP (last 3 results) No results for input(s): PROBNP in the last 8760 hours. HbA1C: No results for input(s): HGBA1C in the last 72 hours. CBG: Recent Labs  Lab 02/20/21 1856  GLUCAP 110*   Lipid Profile: No results for input(s): CHOL, HDL, LDLCALC, TRIG, CHOLHDL, LDLDIRECT in the last 72 hours. Thyroid Function Tests: No results for input(s): TSH, T4TOTAL, FREET4, T3FREE, THYROIDAB in the last 72 hours. Anemia Panel: No results for input(s): VITAMINB12, FOLATE, FERRITIN, TIBC, IRON, RETICCTPCT in the last 72 hours.  --------------------------------------------------------------------------------------------------------------- Urine analysis:    Component Value Date/Time   COLORURINE YELLOW 02/20/2021 2253   APPEARANCEUR HAZY (A) 02/20/2021 2253   APPEARANCEUR Turbid (A) 09/04/2020 1243   LABSPEC 1.010 02/20/2021 2253   PHURINE 5.0  02/20/2021 2253   GLUCOSEU NEGATIVE 02/20/2021 2253   HGBUR LARGE (A) 02/20/2021 2253   BILIRUBINUR NEGATIVE 02/20/2021 2253   BILIRUBINUR Negative 09/04/2020 [place] 02/20/2021 2253   PROTEINUR 100 (A) 02/20/2021 2253   UROBILINOGEN negative 05/19/2015 1203   UROBILINOGEN 1.0 10/22/2014 1804   NITRITE NEGATIVE 02/20/2021 2253   LEUKOCYTESUR NEGATIVE 02/20/2021 2253      Imaging Results:    DG Chest Port 1 View  Result Date: 02/20/2021 CLINICAL DATA:  Hypotension EXAM: PORTABLE CHEST 1 VIEW COMPARISON:  01/06/2019 FINDINGS: Trace right pleural effusion. No frank interstitial edema. No pneumothorax. The heart is top-normal in size.  Thoracic aortic atherosclerosis. IMPRESSION: Trace right pleural effusion. Electronically Signed   By: Julian Hy M.D.   On: 02/20/2021 19:55       Assessment & Plan:    Principal Problem:   Acute metabolic encephalopathy Active Problems:   Acute lower UTI   AKI (acute kidney injury) ([place])   Essential hypertension   Seizure (HCC)   Atrial fibrillation, chronic (HCC)   Protein calorie malnutrition (HCC)   Acute metabolic encephalopathy Most likely secondary to UTI and hypotension as well as dehydration 500 cc bolus on the way to the ER, 30 mL/kg bolus in ED Patient was started on broad-spectrum antibiotics to cover for possible sepsis-vancomycin, aztreonam, cefepime Mentation improving with correction of blood pressure/fluid resuscitation -if patient does not return to baseline with treatment of the above, consider stroke work-up, however low on the differential at this time Check TSH Continue to monitor Sepsis  Sepsis secondary to UTI SIRS criteria hypotension-meets qSOFA criteria see below Life threatening organ dysfunction 2/2 infection is evidenced by: Acute metabolic encephalopathy, AKI Antibiotics started aztreonam, vancomycin, cefepime, continue cefepime Fluids given prior to admission 30 mL/kg bolus Blood  pressure improved, continue lactated Ringer's 150 mL/h SOFA Score 2 SOFA score >2 indicates higher risk or mortality Sepsis order set utilized Urine culture and blood culture pending Monitor this patient on telemetry  UTI Urine culture pending Previous micro grew Klebsiella, sensitive to cephalosporins Continue cefepime Continue to monitor Atrial fibrillation Continue Eliquis With a heart rate of 29 in the ED, holding beta-blocker Not currently in A. Fib Continue to monitor AKI Creatinine baseline 1.19, today 1.89 BUN baseline 29, today 48 Secondary to sepsis/dehydration Continue fluids Trend in the a.m. Protein calorie malnutrition Mild Encourage p.o. intake History of seizure Continue Keppra  Hypertension In the setting of hypotension, holding Coreg and losartan Dementia Continue Aricept GERD Hold Protonix in the setting of AKI   DVT Prophylaxis-  Eliquis- SCDs 65  AM Labs Ordered, also please review Full Orders  Family Communication: Admission, patients condition and plan of care including tests being ordered have been discussed with the patient and son who indicate understanding and agree with the plan and Code Status.  Code Status: DNR  Admission status: Observation/ Disposition: Anticipated Discharge 24 to 48 hours discharge to home  Time spent in minutes : [place]

## 2021-02-21 NOTE — Progress Notes (Signed)
Patient seen and examined; admitted after midnight secondary to altered mental status.  Initially was thought that the patient has sepsis, but work-up and lack of sources essentially rule out sepsis diagnosis.  Images negative for acute intracranial normalities.  There is concerns for potential TIA process in the setting of transient hypotension and dehydration.  Is feeling better and hemodynamically stable currently.  Please refer to H&P written by Dr. Clearence Ped for further info/details on admission.  Plan: -Given no elevation of his procalcitonin level and lack of a specific source of infection will discontinue antibiotics and follow clinical response. -Continue fluid resuscitation -Continue holding/further adjusting antihypertensive agents. -Physical therapy evaluation will be requested and further supportive care will be provided -Follow clinical response. -follow electrolytes trend and further replete/addressed decompensations, potassium slightly elevated. Continue telemetry monitoring. -hopefully discharge tomorrow if stable.  Barton Dubois MD [phone]

## 2021-02-21 NOTE — Progress Notes (Signed)
Date and time results received: 02/21/21 Night Shift RN (use smartphrase ".now" to insert current time)  Test:Potassium  Critical Value: 5.7  Name of Provider Notified: Barton Dubois, MD via Amion   Orders Received? Or Actions Taken?: No new orders

## 2021-02-21 NOTE — Plan of Care (Signed)

## 2021-02-22 DIAGNOSIS — E440 Moderate protein-calorie malnutrition: Secondary | ICD-10-CM

## 2021-02-22 DIAGNOSIS — I5022 Chronic systolic (congestive) heart failure: Secondary | ICD-10-CM

## 2021-02-22 DIAGNOSIS — I9589 Other hypotension: Secondary | ICD-10-CM

## 2021-02-22 DIAGNOSIS — E861 Hypovolemia: Secondary | ICD-10-CM

## 2021-02-22 LAB — CBC
HCT: 30.9 % — ABNORMAL LOW (ref 39.0–52.0)
Hemoglobin: 9.8 g/dL — ABNORMAL LOW (ref 13.0–17.0)
MCH: 27.3 pg (ref 26.0–34.0)
MCHC: 31.7 g/dL (ref 30.0–36.0)
MCV: 86.1 fL (ref 80.0–100.0)
Platelets: 159 10*3/uL (ref 150–400)
RBC: 3.59 MIL/uL — ABNORMAL LOW (ref 4.22–5.81)
RDW: 16.6 % — ABNORMAL HIGH (ref 11.5–15.5)
WBC: 3.7 10*3/uL — ABNORMAL LOW (ref 4.0–10.5)
nRBC: 0 % (ref 0.0–0.2)

## 2021-02-22 LAB — URINE CULTURE: Culture: NO GROWTH

## 2021-02-22 LAB — BASIC METABOLIC PANEL
Anion gap: 7 (ref 5–15)
BUN: 36 mg/dL — ABNORMAL HIGH (ref 8–23)
CO2: 25 mmol/L (ref 22–32)
Calcium: 8.9 mg/dL (ref 8.9–10.3)
Chloride: 104 mmol/L (ref 98–111)
Creatinine, Ser: 1.19 mg/dL (ref 0.61–1.24)
GFR, Estimated: 56 mL/min — ABNORMAL LOW (ref >60)
Glucose, Bld: 110 mg/dL — ABNORMAL HIGH (ref 70–99)
Potassium: 4.3 mmol/L (ref 3.5–5.1)
Sodium: 136 mmol/L (ref 135–145)

## 2021-02-22 MED ORDER — LOSARTAN POTASSIUM 50 MG PO TABS
25.0000 mg | ORAL_TABLET | Freq: Every day | ORAL | 3 refills | Status: DC
Start: 2021-02-22 — End: 2021-03-04

## 2021-02-22 MED ORDER — CARVEDILOL 3.125 MG PO TABS: 3.1250 mg | ORAL_TABLET | Freq: Two times a day (BID) | ORAL | 3 refills | Status: AC

## 2021-02-22 NOTE — Plan of Care (Signed)

## 2021-02-22 NOTE — Progress Notes (Signed)
Nsg Discharge Note  Admit Date:  02/20/2021 Discharge date: 02/22/2021   Scott [name] to be D/C'd Home per MD order.  AVS completed.  Copy for chart, and copy for patient signed, and dated. Patient/caregiver able to verbalize understanding.  Discharge Medication: Allergies as of 02/22/2021       Reactions   Atorvastatin Hives, Rash   Rocephin [ceftriaxone Sodium In Dextrose] Other (See Comments)   Recurrent seizures        Medication List     STOP taking these medications    levofloxacin 500 MG tablet Commonly known as: LEVAQUIN       TAKE these medications    acetaminophen 500 MG tablet Commonly known as: TYLENOL Take 1,000 mg by mouth every 6 (six) hours as needed for moderate pain or headache.   albuterol (2.5 MG/3ML) 0.083% nebulizer solution Commonly known as: PROVENTIL Take 3 mLs (2.5 mg total) by nebulization every 6 (six) hours as needed for wheezing or shortness of breath.   albuterol 108 (90 Base) MCG/ACT inhaler Commonly known as: VENTOLIN HFA Inhale 2 puffs into the lungs every 6 (six) hours as needed for wheezing or shortness of breath.   aspirin 81 MG EC tablet Take 1 tablet (81 mg total) by mouth daily. Swallow whole.   carvedilol 3.125 MG tablet Commonly known as: COREG Take 1 tablet (3.125 mg total) by mouth 2 (two) times daily with a meal. What changed:  medication strength how much to take   Combivent Respimat 20-100 MCG/ACT Aers respimat Generic drug: Ipratropium-Albuterol Inhale 1 puff into the lungs every 6 (six) hours as needed for wheezing or shortness of breath (As needed for wheezing , cough or shortness of breath).   donepezil 10 MG tablet Commonly known as: ARICEPT Take 1 tablet (10 mg total) by mouth at bedtime.   Eliquis 2.5 MG Tabs tablet Generic drug: apixaban Take 2.5 mg by mouth 2 (two) times daily.   febuxostat 40 MG tablet Commonly known as: ULORIC Take 1 tablet (40 mg total) by mouth daily.   fluticasone 50  MCG/ACT nasal spray Commonly known as: FLONASE Place 1 spray into both nostrils 2 (two) times daily as needed for allergies or rhinitis. SPRAY 1 SPRAY IN EACH NOSTRIL ONCE DAILY.   furosemide 20 MG tablet Commonly known as: LASIX TAKE 1 TABLET DAILY AS NEEDED (signs of volume overload).   gabapentin 400 MG capsule Commonly known as: NEURONTIN TAKE 1 CAPSULE 2 TIMES A DAY   levETIRAcetam 250 MG tablet Commonly known as: KEPPRA TAKE ONE TABLET TWICE DAILY   losartan 50 MG tablet Commonly known as: COZAAR Take 0.5 tablets (25 mg total) by mouth daily. What changed: how much to take   Myrbetriq 25 MG Tb24 tablet Generic drug: mirabegron ER Take 1 tablet (25 mg total) by mouth daily.   pantoprazole 40 MG tablet Commonly known as: PROTONIX Take 1 tablet (40 mg total) by mouth daily.   sertraline 50 MG tablet Commonly known as: ZOLOFT Take 1 tablet (50 mg total) by mouth daily.   simvastatin 10 MG tablet Commonly known as: ZOCOR Take 1 tablet (10 mg total) by mouth daily at 6 PM.   tamsulosin 0.4 MG Caps capsule Commonly known as: FLOMAX TAKE 1 CAPSULE DAILY        Discharge Assessment: Vitals:   02/22/21 0525 02/22/21 0900  BP: (!) 154/80 (!) 154/111  Pulse: 83 89  Resp: 18 20  Temp: 97.6 F (36.4 C)   SpO2: 92% 91%  Skin clean, dry and intact without evidence of skin break down, no evidence of skin tears noted. IV catheter discontinued intact. Site without signs and symptoms of complications - no redness or edema noted at insertion site, patient denies c/o pain - only slight tenderness at site.  Dressing with slight pressure applied.  D/c Instructions-Education: Discharge instructions given to patient/family with verbalized understanding. D/c education completed with patient/family including follow up instructions, medication list, d/c activities limitations if indicated, with other d/c instructions as indicated by MD - patient able to verbalize understanding,  all questions fully answered. Patient instructed to return to ED, call 911, or call MD for any changes in condition.  Patient escorted via [place], and D/C home via private auto.  Clovis Fredrickson, LPN [id number] 1:79 PM

## 2021-02-22 NOTE — Discharge Summary (Signed)
Physician Discharge Summary  Scott [name] KTG:[id number] DOB: July 21, 1921 DOA: 02/20/2021  PCP: Dettinger, Fransisca Kaufmann, MD  Admit date: 02/20/2021 Discharge date: 02/22/2021  Time spent: 35 minutes  Recommendations for Outpatient Follow-up:  Reassess blood pressure and further adjust antihypertensive treatment as needed Repeat basic metabolic panel to follow electrolytes and renal function. Advance care planning and goals of care discussion recommended.  Discharge Diagnoses:  Principal Problem:   Acute metabolic encephalopathy Active Problems:   Chronic systolic HF (heart failure) (HCC)   Acute lower UTI   AKI (acute kidney injury) ([place])   Essential hypertension   Seizure (HCC)   Sepsis (HCC)   Atrial fibrillation, chronic (HCC)   Protein calorie malnutrition ([place])   AMS (altered mental status)   Hypotension due to hypovolemia   Discharge Condition: Stable and improved.  Discharged back home with 24 hours caregiver and family care.  Patient instructed to follow-up with PCP in 10 days.  CODE STATUS: DNR  Diet recommendation: Low-sodium diet  Filed Weights   02/20/21 1855  Weight: 73 kg    Brief History of present illness:  As per H&P written by Dr.Zierle-Ghosh on 02/21/21 Scott [name]  is a 86 y.o. male, with history of atrial fibrillation, BPH, bladder cancer, COPD, CAD, GERD, hyperlipidemia, hypertension, peripheral artery disease, CHF, and more presents the ED with a chief complaint of altered mental status.  Patient is pretty drowsy, although responsive at the time of my exam.  He wakes to light touch, is oriented to self, and then falls back to sleep.  Son is at bedside.  Most of the history comes from him, and chart review.  Son reports that patient lives by himself but has 24-hour caregivers.  Caregiver noticed that patient had become unresponsive, weak, and drooling, so she called EMS.  When EMS got there her blood pressure was 80s over 40s.  They advise going to the  hospital but family decided to try to manage patient at home.  They encouraged him to have p.o. intake.  At baseline patient can feed himself, but today could not do it.  His appetite did seem to be intact.  They again noticed him to become unresponsive and called EMS again.  At that time his blood pressure was in the 60s.  Decision was made to transport to the hospital.  Son reports that couple of days ago patient was complaining of neck pain which is not normal for him.  Son also reports that he has episodes of becoming less responsive, and over the last 6 months has had a general decline, but he was improves.  His blood pressure sometimes goes low but it always comes back.  Patient is on several blood pressure medications including Coreg, losartan, Lasix, Flomax.  Son is concerned that these medications need to be titrated down.  At baseline patient ambulates in a wheelchair.  He can transfer himself from wheelchair to commode and back.  He can hold a normal conversation.  He recognizes family.  He does have a history of bladder cancer.  Son does not think that patient has had a change in his urinary habits.  He has not had any fever.  Hospital Course:  1-acute metabolic encephalopathy -No source of infection appreciated -Patient most likely ended experiencing TIA in the setting of hypovolemia and hypotension. -Symptoms completely improve after fluid resuscitation has been provided and adjustment to antihypertensive agents made. -Continue to maintain adequate hydration at time of discharge -Close monitoring of vital signs recommended.  2-presumed sepsis -Initially thought that the patient's presentation was secondary to infection coming from his urinary tract and -Results and lack of symptoms has proven this not to be the case -Procalcitonin less than 0.10 suggesting no presence of systemic bacterial infection. -Patient resolution of symptoms also going against the presence of significant infection  currently. -Patient with a prior history of bladder cancer and intermittent episodes of hematuria; unchanged from his baseline. -Continue to maintain adequate hydration. -Sepsis has been ruled out.  3-acute kidney injury -In the setting of dehydration and continued use of nephrotoxic agents -Resolved after holding medications and providing fluid resuscitation. -At time of discharge safe to resume adjusted dose of antihypertensive agents. -Repeat basic metabolic panel follow-up visit to reassess renal function and stability.  4-chronic atrial fibrillation -Stable and with rate controlled -Resume adjusted dose of beta-blocker -Continue Eliquis for secondary prevention.  5-moderate protein calorie malnutrition -Continue to encourage oral intake and the use of feeding supplements.  6-underlying history of dementia without behavioral disturbances -Continue the use of Aricept -Continue supportive care.  7-history of seizure disorder -Resume the use of Keppra -No seizure activity appreciated.  8-history of hypertension -With presentation of hypotension at time of admission -Antihypertensive agents dose has been adjusted to provide adequate control without increased chances of decreasing blood pressure too much.  9-chronic systolic heart failure -Appears to be stable and compensated -Continue to watch sodium intake and check daily weights. -Continue as needed use of Lasix and resume adjusted dose of ARB and beta-blocker. -Outpatient follow-up with cardiology service recommended -Most recent echo demonstrating ejection fraction 15 to 20%.  10-gastroesophageal flux disease -Continue PPI.  Procedures: See below for x-ray reports.  Consultations: None  Discharge Exam: Vitals:   02/22/21 0525 02/22/21 0900  BP: (!) 154/80 (!) 154/111  Pulse: 83 89  Resp: 18 20  Temp: 97.6 F (36.4 C)   SpO2: 92% 91%    General: Afebrile, no chest pain, no nausea, no vomiting, no shortness  of breath.  Patient with significant improvement in his mentation and following commands appropriately.  Appears to be back to baseline. Cardiovascular: Irregular rhythm and, rate controlled, no rubs, no gallops, no JVD. Respiratory: Clear to auscultation bilaterally; no using accessory muscle.  Good saturation on room air. Abdomen: Soft, nontender, positive bowel sounds, no distention. Extremities: Right lower extremity without cyanosis or clubbing; no edema appreciated on exam.  Left AKA with healed stump.  Discharge Instructions   Discharge Instructions     (HEART FAILURE PATIENTS) Call MD:  Anytime you have any of the following symptoms: 1) 3 pound weight gain in 24 hours or 5 pounds in 1 week 2) shortness of breath, with or without a dry hacking cough 3) swelling in the hands, feet or stomach 4) if you have to sleep on extra pillows at night in order to breathe.   Complete by: As directed    Diet - low sodium heart healthy   Complete by: As directed    Discharge instructions   Complete by: As directed    Take medications as prescribed Maintain adequate hydration Arrange follow-up with PCP in 10 days Check patient's weight and volume status on daily basis. Continue to watch sodium intake.      Allergies as of 02/22/2021       Reactions   Atorvastatin Hives, Rash   Rocephin [ceftriaxone Sodium In Dextrose] Other (See Comments)   Recurrent seizures        Medication List  STOP taking these medications    levofloxacin 500 MG tablet Commonly known as: LEVAQUIN       TAKE these medications    acetaminophen 500 MG tablet Commonly known as: TYLENOL Take 1,000 mg by mouth every 6 (six) hours as needed for moderate pain or headache.   albuterol (2.5 MG/3ML) 0.083% nebulizer solution Commonly known as: PROVENTIL Take 3 mLs (2.5 mg total) by nebulization every 6 (six) hours as needed for wheezing or shortness of breath.   albuterol 108 (90 Base) MCG/ACT  inhaler Commonly known as: VENTOLIN HFA Inhale 2 puffs into the lungs every 6 (six) hours as needed for wheezing or shortness of breath.   aspirin 81 MG EC tablet Take 1 tablet (81 mg total) by mouth daily. Swallow whole.   carvedilol 3.125 MG tablet Commonly known as: COREG Take 1 tablet (3.125 mg total) by mouth 2 (two) times daily with a meal. What changed:  medication strength how much to take   Combivent Respimat 20-100 MCG/ACT Aers respimat Generic drug: Ipratropium-Albuterol Inhale 1 puff into the lungs every 6 (six) hours as needed for wheezing or shortness of breath (As needed for wheezing , cough or shortness of breath).   donepezil 10 MG tablet Commonly known as: ARICEPT Take 1 tablet (10 mg total) by mouth at bedtime.   Eliquis 2.5 MG Tabs tablet Generic drug: apixaban Take 2.5 mg by mouth 2 (two) times daily.   febuxostat 40 MG tablet Commonly known as: ULORIC Take 1 tablet (40 mg total) by mouth daily.   fluticasone 50 MCG/ACT nasal spray Commonly known as: FLONASE Place 1 spray into both nostrils 2 (two) times daily as needed for allergies or rhinitis. SPRAY 1 SPRAY IN EACH NOSTRIL ONCE DAILY.   furosemide 20 MG tablet Commonly known as: LASIX TAKE 1 TABLET DAILY AS NEEDED (signs of volume overload).   gabapentin 400 MG capsule Commonly known as: NEURONTIN TAKE 1 CAPSULE 2 TIMES A DAY   levETIRAcetam 250 MG tablet Commonly known as: KEPPRA TAKE ONE TABLET TWICE DAILY   losartan 50 MG tablet Commonly known as: COZAAR Take 0.5 tablets (25 mg total) by mouth daily. What changed: how much to take   Myrbetriq 25 MG Tb24 tablet Generic drug: mirabegron ER Take 1 tablet (25 mg total) by mouth daily.   pantoprazole 40 MG tablet Commonly known as: PROTONIX Take 1 tablet (40 mg total) by mouth daily.   sertraline 50 MG tablet Commonly known as: ZOLOFT Take 1 tablet (50 mg total) by mouth daily.   simvastatin 10 MG tablet Commonly known as:  ZOCOR Take 1 tablet (10 mg total) by mouth daily at 6 PM.   tamsulosin 0.4 MG Caps capsule Commonly known as: FLOMAX TAKE 1 CAPSULE DAILY       Allergies  Allergen Reactions   Atorvastatin Hives and Rash   Rocephin [Ceftriaxone Sodium In Dextrose] Other (See Comments)    Recurrent seizures    Follow-up Information     Dettinger, Fransisca Kaufmann, MD. Schedule an appointment as soon as possible for a visit in 10 day(s).   Specialties: Family Medicine, Cardiology Contact information: [place] [address] [phone]         Minus Breeding, MD .   Specialty: Cardiology Contact information: [address] [place] [address] [phone]                 The results of significant diagnostics from this hospitalization (including imaging, microbiology, ancillary  and laboratory) are listed below for reference.    Significant Diagnostic Studies: DG Chest Port 1 View  Result Date: 02/20/2021 CLINICAL DATA:  Hypotension EXAM: PORTABLE CHEST 1 VIEW COMPARISON:  01/06/2019 FINDINGS: Trace right pleural effusion. No frank interstitial edema. No pneumothorax. The heart is top-normal in size.  Thoracic aortic atherosclerosis. IMPRESSION: Trace right pleural effusion. Electronically Signed   By: Julian Hy M.D.   On: 02/20/2021 19:55    Microbiology: Recent Results (from the past 240 hour(s))  Blood Culture (routine x 2)     Status: None (Preliminary result)   Collection Time: 02/20/21  7:00 PM   Specimen: Right Antecubital; Blood  Result Value Ref Range Status   Specimen Description RIGHT ANTECUBITAL  Final   Special Requests   Final    Blood Culture adequate volume BOTTLES DRAWN AEROBIC AND ANAEROBIC   Culture   Final    NO GROWTH 2 DAYS Performed at [facility], [address] [place], [place] [address]    Report Status PENDING  Incomplete  Resp Panel by RT-PCR (Flu A&B, Covid) Nasopharyngeal Swab     Status: None   Collection Time:  02/20/21  7:04 PM   Specimen: Nasopharyngeal Swab; Nasopharyngeal(NP) swabs in vial transport medium  Result Value Ref Range Status   SARS Coronavirus 2 by RT PCR NEGATIVE NEGATIVE Final    Comment: (NOTE) SARS-CoV-2 target nucleic acids are NOT DETECTED.  The SARS-CoV-2 RNA is generally detectable in upper respiratory specimens during the acute phase of infection. The lowest concentration of SARS-CoV-2 viral copies this assay can detect is 138 copies/mL. A negative result does not preclude SARS-Cov-2 infection and should not be used as the sole basis for treatment or other patient management decisions. A negative result may occur with  improper specimen collection/handling, submission of specimen other than nasopharyngeal swab, presence of viral mutation(s) within the areas targeted by this assay, and inadequate number of viral copies(<138 copies/mL). A negative result must be combined with clinical observations, patient history, and epidemiological information. The expected result is Negative.  Fact Sheet for Patients:  EntrepreneurPulse.com.au  Fact Sheet for Healthcare Providers:  IncredibleEmployment.be  This test is no t yet approved or cleared by the [place] FDA and  has been authorized for detection and/or diagnosis of SARS-CoV-2 by FDA under an Emergency Use Authorization (EUA). This EUA will remain  in effect (meaning this test can be used) for the duration of the COVID-19 declaration under Section 564(b)(1) of the Act, 21 U.S.C.section 360bbb-3(b)(1), unless the authorization is terminated  or revoked sooner.       Influenza A by PCR NEGATIVE NEGATIVE Final   Influenza B by PCR NEGATIVE NEGATIVE Final    Comment: (NOTE) The Xpert Xpress SARS-CoV-2/FLU/RSV plus assay is intended as an aid in the diagnosis of influenza from Nasopharyngeal swab specimens and should not be used as a sole basis for treatment. Nasal washings  and aspirates are unacceptable for Xpert Xpress SARS-CoV-2/FLU/RSV testing.  Fact Sheet for Patients: EntrepreneurPulse.com.au  Fact Sheet for Healthcare Providers: IncredibleEmployment.be  This test is not yet approved or cleared by the [place] FDA and has been authorized for detection and/or diagnosis of SARS-CoV-2 by FDA under an Emergency Use Authorization (EUA). This EUA will remain in effect (meaning this test can be used) for the duration of the COVID-19 declaration under Section 564(b)(1) of the Act, 21 U.S.C. section 360bbb-3(b)(1), unless the authorization is terminated or revoked.  Performed at [facility], [address] [place],  [place] [address]   Blood Culture (routine x 2)     Status: None (Preliminary result)   Collection Time: 02/20/21  7:08 PM   Specimen: BLOOD LEFT ARM  Result Value Ref Range Status   Specimen Description BLOOD LEFT ARM  Final   Special Requests   Final    Blood Culture results may not be optimal due to an inadequate volume of blood received in culture bottles BOTTLES DRAWN AEROBIC AND ANAEROBIC   Culture   Final    NO GROWTH 2 DAYS Performed at [facility], [address] [place], [place] [address]    Report Status PENDING  Incomplete     Labs: Basic Metabolic Panel: Recent Labs  Lab 02/20/21 1900 02/21/21 0440 02/21/21 1247 02/22/21 0407  NA 137 135 135 136  K 4.1 5.7* 5.3* 4.3  CL 105 107 107 104  CO2 26 22 20* 25  GLUCOSE 110* 102* 106* 110*  BUN 48* 44* 40* 36*  CREATININE 1.89* 1.69* 1.43* 1.19  CALCIUM 8.6* 8.6* 8.7* 8.9  MG  --  2.3  --   --    Liver Function Tests: Recent Labs  Lab 02/20/21 1900 02/21/21 0440  AST 16 28  ALT 10 10  ALKPHOS 159* 148*  BILITOT 0.2* 0.9  PROT 6.5 6.1*  ALBUMIN 3.3* 3.0*    Recent Labs  Lab 02/21/21 0937  AMMONIA 16   CBC: Recent Labs  Lab 02/20/21 1900 02/21/21 0440 02/22/21 0407  WBC 4.3 3.9* 3.7*  NEUTROABS 2.3 2.3  --    HGB 10.3* 9.7* 9.8*  HCT 32.8* 31.8* 30.9*  MCV 87.0 88.1 86.1  PLT 178 122* 159    BNP (last 3 results) Recent Labs    05/18/20 1859 08/16/20 1845  BNP 1,846.0* 1,856.0*    CBG: Recent Labs  Lab 02/20/21 1856  GLUCAP 110*   Signed:  Barton Dubois MD.  [facility] 02/22/2021, 12:06 PM

## 2021-02-24 ENCOUNTER — Other Ambulatory Visit: Payer: Self-pay

## 2021-02-24 DIAGNOSIS — I10 Essential (primary) hypertension: Secondary | ICD-10-CM

## 2021-02-25 ENCOUNTER — Telehealth: Payer: Self-pay

## 2021-02-25 ENCOUNTER — Telehealth: Payer: Self-pay | Admitting: Family Medicine

## 2021-02-25 LAB — CULTURE, BLOOD (ROUTINE X 2)
Culture: NO GROWTH
Culture: NO GROWTH
Special Requests: ADEQUATE

## 2021-02-25 NOTE — Telephone Encounter (Signed)
Transition Care Management Follow-up Telephone Call:   Date of discharge and from where:  02/21/2021 / [facility] ( HIPAA verified by patient.  Patient gave verbal consent to speak with his CNA.  Call completed with CNA Antony Blackbird ) How have you been since you were released from the hospital? " Doing a lot better. More alert." Any questions or concerns? No  Items Reviewed: Did the pt receive and understand the discharge instructions provided? Yes  Medications obtained and verified? Yes  Other? No  Any new allergies since your discharge? No  Dietary orders reviewed? Yes Do you have support at home? Yes  24/7 care takers  Home Care and Equipment/Supplies: Were home health services ordered? not applicable If so, what is the name of the agency? N/a  Has the agency set up a time to come to the patient's home? not applicable Were any new equipment or medical supplies ordered?  No What is the name of the medical supply agency? N/a Were you able to get the supplies/equipment? not applicable Do you have any questions related to the use of the equipment or supplies? No  Functional Questionnaire: (I = Independent and D = Dependent) ADLs: D  Bathing/Dressing- D  Meal Prep- D  Eating- I  Maintaining continence- D  Transferring/Ambulation- I  Managing Meds- I  Follow up appointments reviewed:  PCP Hospital f/u appt confirmed? No  Scheduled to see  Specialist Hospital f/u appt confirmed? No  Scheduled to see  Are transportation arrangements needed? No  If their condition worsens, is the pt aware to call PCP or go to the Emergency Dept.? Yes Was the patient provided with contact information for the PCP's office or ED? Yes Was to pt encouraged to call back with questions or concerns? Yes  Quinn Plowman RN,BSN,CCM RN Case Manager [place]  [phone]

## 2021-02-25 NOTE — Telephone Encounter (Signed)
Vaughan Basta called from [place] to get verbal OK for pt to resume with Home Health Nurse.  Harold Barban the verbal OK to do so.

## 2021-02-26 DIAGNOSIS — I5042 Chronic combined systolic (congestive) and diastolic (congestive) heart failure: Secondary | ICD-10-CM | POA: Diagnosis not present

## 2021-02-26 DIAGNOSIS — J189 Pneumonia, unspecified organism: Secondary | ICD-10-CM | POA: Diagnosis not present

## 2021-02-26 DIAGNOSIS — I110 Hypertensive heart disease with heart failure: Secondary | ICD-10-CM | POA: Diagnosis not present

## 2021-02-26 DIAGNOSIS — J9611 Chronic respiratory failure with hypoxia: Secondary | ICD-10-CM | POA: Diagnosis not present

## 2021-02-26 DIAGNOSIS — N3000 Acute cystitis without hematuria: Secondary | ICD-10-CM | POA: Diagnosis not present

## 2021-02-26 DIAGNOSIS — J440 Chronic obstructive pulmonary disease with acute lower respiratory infection: Secondary | ICD-10-CM | POA: Diagnosis not present

## 2021-02-27 ENCOUNTER — Ambulatory Visit (INDEPENDENT_AMBULATORY_CARE_PROVIDER_SITE_OTHER): Payer: Medicare Other | Admitting: Pharmacist

## 2021-02-27 DIAGNOSIS — E7800 Pure hypercholesterolemia, unspecified: Secondary | ICD-10-CM

## 2021-02-27 DIAGNOSIS — I482 Chronic atrial fibrillation, unspecified: Secondary | ICD-10-CM

## 2021-03-03 ENCOUNTER — Other Ambulatory Visit: Payer: Self-pay | Admitting: Family Medicine

## 2021-03-03 ENCOUNTER — Ambulatory Visit: Payer: Medicare Other | Admitting: Nurse Practitioner

## 2021-03-03 NOTE — Telephone Encounter (Signed)
Ok to fill?  Historical provider on medlist

## 2021-03-04 ENCOUNTER — Ambulatory Visit (INDEPENDENT_AMBULATORY_CARE_PROVIDER_SITE_OTHER): Payer: Medicare Other | Admitting: Family Medicine

## 2021-03-04 ENCOUNTER — Encounter: Payer: Self-pay | Admitting: Family Medicine

## 2021-03-04 ENCOUNTER — Other Ambulatory Visit: Payer: Self-pay

## 2021-03-04 VITALS — BP 129/62 | HR 55 | Temp 97.6°F

## 2021-03-04 DIAGNOSIS — G309 Alzheimer's disease, unspecified: Secondary | ICD-10-CM

## 2021-03-04 DIAGNOSIS — I10 Essential (primary) hypertension: Secondary | ICD-10-CM

## 2021-03-04 DIAGNOSIS — N179 Acute kidney failure, unspecified: Secondary | ICD-10-CM | POA: Diagnosis not present

## 2021-03-04 DIAGNOSIS — I9589 Other hypotension: Secondary | ICD-10-CM | POA: Diagnosis not present

## 2021-03-04 DIAGNOSIS — I482 Chronic atrial fibrillation, unspecified: Secondary | ICD-10-CM | POA: Diagnosis not present

## 2021-03-04 DIAGNOSIS — G546 Phantom limb syndrome with pain: Secondary | ICD-10-CM

## 2021-03-04 DIAGNOSIS — N390 Urinary tract infection, site not specified: Secondary | ICD-10-CM | POA: Diagnosis not present

## 2021-03-04 DIAGNOSIS — G9341 Metabolic encephalopathy: Secondary | ICD-10-CM

## 2021-03-04 DIAGNOSIS — E861 Hypovolemia: Secondary | ICD-10-CM

## 2021-03-04 DIAGNOSIS — F0280 Dementia in other diseases classified elsewhere without behavioral disturbance: Secondary | ICD-10-CM | POA: Diagnosis not present

## 2021-03-04 DIAGNOSIS — Z09 Encounter for follow-up examination after completed treatment for conditions other than malignant neoplasm: Secondary | ICD-10-CM

## 2021-03-04 DIAGNOSIS — I5042 Chronic combined systolic (congestive) and diastolic (congestive) heart failure: Secondary | ICD-10-CM | POA: Diagnosis not present

## 2021-03-04 MED ORDER — LOSARTAN POTASSIUM 25 MG PO TABS: 25.0000 mg | ORAL_TABLET | Freq: Every day | ORAL | 0 refills | Status: DC

## 2021-03-04 MED ORDER — DONEPEZIL HCL 5 MG PO TABS
5.0000 mg | ORAL_TABLET | Freq: Every day | ORAL | 1 refills | Status: DC
Start: 2021-03-04 — End: 2021-09-16

## 2021-03-04 MED ORDER — GABAPENTIN 100 MG PO CAPS: 200.0000 mg | ORAL_CAPSULE | Freq: Two times a day (BID) | ORAL | 2 refills | Status: DC

## 2021-03-04 NOTE — Patient Instructions (Signed)
Visit Information  Thank you for taking time to visit with me today. Please don't hesitate to contact me if I can be of assistance to you before our next scheduled telephone appointment.  Following are the goals we discussed today:  Current Barriers:  Unable to independently afford treatment regimen   Pharmacist Clinical Goal(s):  patient will verbalize ability to afford treatment regimen through collaboration with PharmD and provider.    Interventions: 1:1 collaboration with Dettinger, Fransisca Kaufmann, MD regarding development and update of comprehensive plan of care as evidenced by provider attestation and co-signature Inter-disciplinary care team collaboration (see longitudinal plan of care) Comprehensive medication review performed; medication list updated in electronic medical record  Atrial Fibrillation: Controlled; current rate/rhythm control-CARVEDILOL; anticoagulant treatment: ELIQUIS 2.5MG  CHADS2VASc score: 5 Home blood pressure, heart rate readings:  Recommended CONTINUE ELIQUIS; SAMPLES GIVEN Assessed patient finances. WILL APPLY FOR BMS PATIENT ASSISTANCE PROGRAM--AWAITING PATIENT FINANCIALS DENIES SIGNS/SYMPTOMS OF BLEEDING; STABLE; LABS REVIEWED  Patient Goals/Self-Care Activities patient will:  - take medications as prescribed as evidenced by patient report and record review collaborate with provider on medication access solutions    Please call the care guide team at [phone] if you need to cancel or reschedule your appointment.   The patient verbalized understanding of instructions, educational materials, and care plan provided today and declined offer to receive copy of patient instructions, educational materials, and care plan.    Regina Eck, PharmD, BCPS Clinical Pharmacist, [place]  II Phone [phone]

## 2021-03-04 NOTE — Progress Notes (Signed)
Subjective:  Patient ID: Scott [name], male    DOB: Jun 25, 1921, 87 y.o.   MRN: [id number]  Patient Care Team: Dettinger, Fransisca Kaufmann, MD as PCP - General (Family Medicine) Minus Breeding, MD as PCP - Cardiology (Cardiology) Thompson Grayer, MD as Consulting Physician (Cardiology) Early, Arvilla Meres, MD as Consulting Physician (Vascular Surgery) Minus Breeding, MD as Consulting Physician (Cardiology) Josue Hector, MD as Consulting Physician (Cardiology) Lavera Guise, [facility] as [place] [facility] (Pharmacist)   Chief Complaint:  Hospitalization Follow-up   HPI: Scott [name] is a 87 y.o. male presenting on 03/04/2021 for Hospitalization Follow-up   Patient presents today with his son and caregiver.  Today's visit is for hospital discharge follow-up.  He was admitted to [facility] on 02/20/2021 for acute metabolic encephalopathy, hypotension due to hypovolemia, chronic systolic heart failure, protein calorie malnutrition, acute lower urinary tract infection, sepsis, acute kidney injury, seizures, and altered mental status.  Patient has no specific complaints or concerns today, states he is doing okay.  Son and caregiver report patient has improved slightly since discharge.  He is not eating and drinking as well as he should.  States he will have more energy during the night and is tired or sleeping during the daytime.  He has not had any seizure-like activity since being home.  He has a follow-up appointment scheduled Dr. Percival Spanish next month.  Medication changes were made during hospitalization.  Losartan was decreased to 25 mg daily and carvedilol was decreased to 3.125 mg daily.  He has been following this regimen since being home.  He is on gabapentin 4 mg twice daily for phantom limb pain.  He is on benazepril 10 mg nightly for Alzheimer's/dementia.  He is on Eliquis 2.5 mg twice daily for recurrent DVTs and chronic A. fib.  He is wheelchair-bound as he has a left AKA.  He  has 24-hour care at home.  Relevant past medical, surgical, family, and social history reviewed and updated as indicated.  Allergies and medications reviewed and updated. Data reviewed: Chart in Epic.   Past Medical History:  Diagnosis Date   Arthritis    "all over"   Atrial fibrillation ([place])    Benign localized prostatic hyperplasia with lower urinary tract symptoms (LUTS)    Bradycardia 11/28/2015   Severe-HR in the 20s to 30s following intubation for urological procedure.   CAD- non obstructive disease by cath 3/14 cardiologist-  dr hochrein   Chronic lower back pain    COPD (chronic obstructive pulmonary disease) ([place])    DDD (degenerative disc disease)    Depression    Dyspnea    w/ exertion and lying down (raise head)   ETOH abuse    GERD (gastroesophageal reflux disease)    Glaucoma    Glaucoma, both eyes    History of bladder cancer urologist-- dr Jeffie Pollock   first dx 2012--  s/p TURBT's   History of DVT of lower extremity 03/2013   bilateral    History of pulmonary embolus (PE) 04/2013   HTN (hypertension)    Hyperlipidemia    Hypertension    LBBB (left bundle branch block)    chronic   Non-ischemic cardiomyopathy ([place])    last echo 01-18-2017, ef 35-40%   NSVT (nonsustained ventricular tachycardia)    a. NSVT 06/2012; NSVT also seen during 03/2013 adm. b. Med rx. Not candidate for ICD given adv age.   PAD (peripheral artery disease) (HCC)    Popliteal  artery aneurysm, bilateral (HCC)    DOCUMENTED CHRONIC PARTIAL OCCLUSION--  PT DENIES CLAUDICATION OR ANY OTHER SYMPTOMS   PVCs (premature ventricular contractions)    PVD (peripheral vascular disease) (HCC)    Right ABI .75, Left .78 (2006)   Syncope 06/2012   a. Felt to be postural syncope related to diuretics 06/2012.   Systolic and diastolic CHF, chronic (HCC) cardiologsit-  dr hochrein   a. NICM - patent cors 06/2012, EF 40% at that time. b. 03/2013 eval: EF 20-25%.   Vision loss, left eye    due to glaucoma    Wears glasses     Past Surgical History:  Procedure Laterality Date   AMPUTATION Left 10/16/2014   Procedure: AMPUTATION ABOVE KNEE- LEFT;  Surgeon: Mal Misty, MD;  Location: [facility];  Service: Vascular;  Laterality: Left;   CARDIAC CATHETERIZATION  05-11-2004  DR [place] PLAQUE/ NORMAL LVF/ EF 55%/  NON-OBSTRUCTIVE LAD 25%   CARDIAC CATHETERIZATION  06/18/2012   dr hochrein   mild luminal irregularities of coronaries, ef 45-50%   CARDIOVASCULAR STRESS TEST  10-15-2010   LOW RISK NUCLEAR STUDY/ NO EVIDENCE OF ISCHEMIA/ NORMAL EF   CATARACT EXTRACTION W/ INTRAOCULAR LENS  IMPLANT, BILATERAL Bilateral    CYSTOSCOPY  10/07/2011   Procedure: CYSTOSCOPY;  Surgeon: Malka So, MD;  [facility];  Service: Urology;  Laterality: N/A;   CYSTOSCOPY N/A 10/20/2018   Procedure: CYSTOSCOPY WITH FULGURATION OF PROSTATIC URETHRAL TUMOR;  Surgeon: [name], MD;  [facility];  Service: Urology;  Laterality: N/A;   CYSTOSCOPY N/A 10/11/2019   Procedure: CYSTOSCOPY;  Surgeon: Cleon Gustin, MD;  Location[facility];  Service: Urology;  Laterality: N/A;   CYSTOSCOPY W/ RETROGRADES Bilateral 03/04/2020   Procedure: CYSTOSCOPY WITH BILATERAL RETROGRADE;  Surgeon: [name], MD;  Location[facility];  Service: Urology;  Laterality: Bilateral;   CYSTOSCOPY WITH BIOPSY  03/14/2012   Procedure: CYSTOSCOPY WITH BIOPSY;  Surgeon: Malka So, MD;  [facility];  Service: Urology;  Laterality: N/A;  WITH FULGURATION   CYSTOSCOPY WITH BIOPSY N/A 12/09/2016   Procedure: CYSTOSCOPY WITH BIOPSY AND FULGURATION;  Surgeon: [name], MD;  [facility];  Service: Urology;  Laterality: N/A;   CYSTOSCOPY WITH BIOPSY N/A 12/20/2017   Procedure: CYSTOSCOPY WITH BIOPSY WITH FULGURATION;  Surgeon: [name], MD;  [facility];  Service: Urology;  Laterality: N/A;   CYSTOSCOPY WITH BIOPSY N/A 06/09/2018   Procedure: CYSTOSCOPY WITH BIOPSY AND FULGURATION;  Surgeon:  [name], MD;  [facility];  Service: Urology;  Laterality: N/A;   CYSTOSCOPY WITH FULGERATION N/A 01/20/2016   Procedure: CYSTOSCOPY, BIOPSY WITH FULGERATION OF URTHRAL TUMOR;  Surgeon: [name], MD;  [facility];  Service: Urology;  Laterality: N/A;   CYSTOSCOPY WITH FULGERATION N/A 06/01/2016   Procedure: CYSTOSCOPY WITH FULGERATION urethral tumors and bladder neck;  Surgeon: [name], MD;  [facility];  Service: Urology;  Laterality: N/A;   SHOULDER SURGERY Left 1970's   TONSILLECTOMY     TRANSTHORACIC ECHOCARDIOGRAM  01-18-2017   dr hochrein   moderate LVH, ef 35-40%, diffuse hypokinesis/  trivial AR and MR/  mild TR   TRANSURETHRAL RESECTION OF BLADDER TUMOR  10/07/2011   Procedure: TRANSURETHRAL RESECTION OF BLADDER TUMOR (TURBT);  Surgeon: Malka So, MD;  Location: [facility];  Service: Urology;  Laterality: N/A;   TRANSURETHRAL RESECTION OF BLADDER TUMOR N/A 07/10/2015   Procedure:  TRANSURETHRAL RESECTION OF BLADDER TUMOR (TURBT), CYSTOSCOPY ;  Surgeon: [name], MD;  [facility];  Service: Urology;  Laterality: N/A;   TRANSURETHRAL RESECTION OF BLADDER TUMOR N/A 10/11/2019   Procedure: TRANSURETHRAL RESECTION OF PROSTATIC URETHRAL TUMOR;  Surgeon: Cleon Gustin, MD;  [facility];  Service: Urology;  Laterality: N/A;   TRANSURETHRAL RESECTION OF BLADDER TUMOR WITH MITOMYCIN-C N/A 03/04/2020   Procedure: TRANSURETHRAL RESECTION OF BLADDER TUMOR WITH GEMCITABINE;  Surgeon: [name], MD;  [facility];  Service: Urology;  Laterality: N/A;   TRANSURETHRAL RESECTION OF PROSTATE  03/14/2012   Procedure: TRANSURETHRAL RESECTION OF THE PROSTATE WITH GYRUS INSTRUMENTS;  Surgeon: Malka So, MD;  [facility];  Service: Urology;  Laterality: N/A;    Social History   Socioeconomic History   Marital status: Widowed    Spouse name: Not on file   Number of children: 1   Years of education: Not on file   Highest education level:  Not on file  Occupational History   Occupation: Retired  Tobacco Use   Smoking status: Former    Packs/day: 1.00    Years: 35.00    Pack years: 35.00    Types: Cigarettes    Start date: 04/12/1942    Quit date: 04/13/1975    Years since quitting: 45.9   Smokeless tobacco: Never  Vaping Use   Vaping Use: Never used  Substance and Sexual Activity   Alcohol use: Not Currently    Alcohol/week: 7.0 standard drinks    Types: 7 Cans of beer per week   Drug use: No   Sexual activity: Never  Other Topics Concern   Not on file  Social History Narrative   He lives alone, but has 24 hour care from caregivers and family   He has one grown son who lives in [place] and stops by daily - helps with bills, getting meds, etc   He is retired but worked for [employer] for years and lived in [place].   Social Determinants of Health   Financial Resource Strain: Medium Risk   Difficulty of Paying Living Expenses: Somewhat hard  Food Insecurity: No Food Insecurity   Worried About Charity fundraiser in the Last Year: Never true   Ran Out of Food in the Last Year: Never true  Transportation Needs: No Transportation Needs   Lack of Transportation (Medical): No   Lack of Transportation (Non-Medical): No  Physical Activity: Inactive   Days of Exercise per Week: 0 days   Minutes of Exercise per Session: 0 min  Stress: No Stress Concern Present   Feeling of Stress : Not at all  Social Connections: Moderately Integrated   Frequency of Communication with Friends and Family: More than three times a week   Frequency of Social Gatherings with Friends and Family: More than three times a week   Attends Religious Services: 1 to 4 times per year   Active Member of [employer] or Organizations: Yes   Attends Archivist Meetings: 1 to 4 times per year   Marital Status: Widowed  Intimate Partner Violence: Not At Risk   Fear of Current or Ex-Partner: No   Emotionally Abused: No   Physically Abused: No    Sexually Abused: No    Outpatient Encounter Medications as of 03/04/2021  Medication Sig   acetaminophen (TYLENOL) 500 MG tablet Take 1,000 mg by mouth every 6 (six) hours as needed for moderate pain or headache.   albuterol (  PROVENTIL) (2.5 MG/3ML) 0.083% nebulizer solution Take 3 mLs (2.5 mg total) by nebulization every 6 (six) hours as needed for wheezing or shortness of breath.   albuterol (VENTOLIN HFA) 108 (90 Base) MCG/ACT inhaler Inhale 2 puffs into the lungs every 6 (six) hours as needed for wheezing or shortness of breath.   aspirin EC 81 MG EC tablet Take 1 tablet (81 mg total) by mouth daily. Swallow whole.   carvedilol (COREG) 3.125 MG tablet Take 1 tablet (3.125 mg total) by mouth 2 (two) times daily with a meal.   donepezil (ARICEPT) 5 MG tablet Take 1 tablet (5 mg total) by mouth at bedtime.   ELIQUIS 2.5 MG TABS tablet TAKE (1) TABLET TWICE A DAY.   febuxostat (ULORIC) 40 MG tablet Take 1 tablet (40 mg total) by mouth daily.   fluticasone (FLONASE) 50 MCG/ACT nasal spray Place 1 spray into both nostrils 2 (two) times daily as needed for allergies or rhinitis. SPRAY 1 SPRAY IN EACH NOSTRIL ONCE DAILY.   furosemide (LASIX) 20 MG tablet TAKE 1 TABLET DAILY AS NEEDED (signs of volume overload).   Ipratropium-Albuterol (COMBIVENT RESPIMAT) 20-100 MCG/ACT AERS respimat Inhale 1 puff into the lungs every 6 (six) hours as needed for wheezing or shortness of breath (As needed for wheezing , cough or shortness of breath).   levETIRAcetam (KEPPRA) 250 MG tablet TAKE ONE TABLET TWICE DAILY   losartan (COZAAR) 25 MG tablet Take 1 tablet (25 mg total) by mouth daily.   MYRBETRIQ 25 MG TB24 tablet Take 1 tablet (25 mg total) by mouth daily.   pantoprazole (PROTONIX) 40 MG tablet Take 1 tablet (40 mg total) by mouth daily.   sertraline (ZOLOFT) 50 MG tablet Take 1 tablet (50 mg total) by mouth daily.   simvastatin (ZOCOR) 10 MG tablet Take 1 tablet (10 mg total) by mouth daily at 6 PM.    tamsulosin (FLOMAX) 0.4 MG CAPS capsule TAKE 1 CAPSULE DAILY   [DISCONTINUED] donepezil (ARICEPT) 10 MG tablet Take 1 tablet (10 mg total) by mouth at bedtime.   [DISCONTINUED] gabapentin (NEURONTIN) 400 MG capsule TAKE 1 CAPSULE 2 TIMES A DAY   [DISCONTINUED] losartan (COZAAR) 50 MG tablet Take 0.5 tablets (25 mg total) by mouth daily.   gabapentin (NEURONTIN) 100 MG capsule Take 2 capsules (200 mg total) by mouth 2 (two) times daily.   No facility-administered encounter medications on file as of 03/04/2021.    Allergies  Allergen Reactions   Atorvastatin Hives and Rash   Rocephin [Ceftriaxone Sodium In Dextrose] Other (See Comments)    Recurrent seizures    Review of Systems  Unable to perform ROS: Dementia (per son and caregiver)  Constitutional:  Positive for activity change, appetite change and fatigue. Negative for chills, diaphoresis, fever and unexpected weight change.  Respiratory:  Negative for cough and shortness of breath.   Cardiovascular:  Negative for chest pain.  Gastrointestinal:  Negative for abdominal pain.  Genitourinary:  Positive for decreased urine volume and difficulty urinating.  Neurological:  Positive for weakness.  Psychiatric/Behavioral:  Positive for confusion.   All other systems reviewed and are negative.      Objective:  BP 129/62   Pulse (!) 55   Temp 97.6 F (36.4 C)   SpO2 94%    Wt Readings from Last 3 Encounters:  02/20/21 160 lb 15 oz (73 kg)  02/03/21 160 lb (72.6 kg)  10/02/20 160 lb (72.6 kg)    Physical Exam Vitals and nursing note reviewed.  Constitutional:      General: He is not in acute distress.    Appearance: He is ill-appearing (chronically ill). He is not toxic-appearing.  HENT:     Head: Normocephalic and atraumatic.     Mouth/Throat:     Mouth: Mucous membranes are moist.     Pharynx: Oropharynx is clear.  Eyes:     Comments: Has sunglasses on during visit  Cardiovascular:     Rate and Rhythm: Bradycardia  present. Rhythm irregularly irregular.  Pulmonary:     Effort: Pulmonary effort is normal. No respiratory distress.     Breath sounds: No stridor. Decreased breath sounds present. No wheezing, rhonchi or rales.     Comments: On 3L O2 via [place] Chest:     Chest wall: No tenderness.  Abdominal:     General: Bowel sounds are normal.  Skin:    General: Skin is warm and dry.     Capillary Refill: Capillary refill takes less than 2 seconds.  Neurological:     Mental Status: He is alert. Mental status is at baseline.     Gait: Gait abnormal (in wheelchair).  Psychiatric:        Mood and Affect: Mood normal.        Behavior: Behavior normal.    Results for orders placed or performed during the hospital encounter of 02/20/21  Resp Panel by RT-PCR (Flu A&B, Covid) Nasopharyngeal Swab   Specimen: Nasopharyngeal Swab; Nasopharyngeal(NP) swabs in vial transport medium  Result Value Ref Range   SARS Coronavirus 2 by RT PCR NEGATIVE NEGATIVE   Influenza A by PCR NEGATIVE NEGATIVE   Influenza B by PCR NEGATIVE NEGATIVE  Blood Culture (routine x 2)   Specimen: Right Antecubital; Blood  Result Value Ref Range   Specimen Description RIGHT ANTECUBITAL    Special Requests      Blood Culture adequate volume BOTTLES DRAWN AEROBIC AND ANAEROBIC   Culture      NO GROWTH 5 DAYS Performed at [facility], [address] [place], [place] [address]    Report Status 02/25/2021 FINAL   Blood Culture (routine x 2)   Specimen: BLOOD LEFT ARM  Result Value Ref Range   Specimen Description BLOOD LEFT ARM    Special Requests      Blood Culture results may not be optimal due to an inadequate volume of blood received in culture bottles BOTTLES DRAWN AEROBIC AND ANAEROBIC   Culture      NO GROWTH 5 DAYS Performed at [facility], [address], [place], [place] [address]    Report Status 02/25/2021 FINAL   Urine Culture   Specimen: In/Out Cath Urine  Result Value Ref Range   Specimen Description       IN/OUT CATH URINE Performed at [facility], [address], [place], [place] [address]    Special Requests      NONE Performed at [facility], [address] [place], [place] [address]    Culture      NO GROWTH Performed at [facility], [place] [address] [place], [place] [address]    Report Status 02/22/2021 FINAL   Lactic acid, plasma  Result Value Ref Range   Lactic Acid, Venous 1.3 0.5 - 1.9 mmol/L  Lactic acid, plasma  Result Value Ref Range   Lactic Acid, Venous 1.9 0.5 - 1.9 mmol/L  Comprehensive metabolic panel  Result Value Ref Range   Sodium 137 135 - 145 mmol/L   Potassium 4.1 3.5 -  5.1 mmol/L   Chloride 105 98 - 111 mmol/L   CO2 26 22 - 32 mmol/L   Glucose, Bld 110 (H) 70 - 99 mg/dL   BUN 48 (H) 8 - 23 mg/dL   Creatinine, Ser 1.89 (H) 0.61 - 1.24 mg/dL   Calcium 8.6 (L) 8.9 - 10.3 mg/dL   Total Protein 6.5 6.5 - 8.1 g/dL   Albumin 3.3 (L) 3.5 - 5.0 g/dL   AST 16 15 - 41 U/L   ALT 10 0 - 44 U/L   Alkaline Phosphatase 159 (H) 38 - 126 U/L   Total Bilirubin 0.2 (L) 0.3 - 1.2 mg/dL   GFR, Estimated 32 (L) >60 mL/min   Anion gap 6 5 - 15  CBC WITH DIFFERENTIAL  Result Value Ref Range   WBC 4.3 4.0 - 10.5 K/uL   RBC 3.77 (L) 4.22 - 5.81 MIL/uL   Hemoglobin 10.3 (L) 13.0 - 17.0 g/dL   HCT 32.8 (L) 39.0 - 52.0 %   MCV 87.0 80.0 - 100.0 fL   MCH 27.3 26.0 - 34.0 pg   MCHC 31.4 30.0 - 36.0 g/dL   RDW 16.5 (H) 11.5 - 15.5 %   Platelets 178 150 - 400 K/uL   nRBC 0.0 0.0 - 0.2 %   Neutrophils Relative % 52 %   Neutro Abs 2.3 1.7 - 7.7 K/uL   Lymphocytes Relative 34 %   Lymphs Abs 1.4 0.7 - 4.0 K/uL   Monocytes Relative 10 %   Monocytes Absolute 0.4 0.1 - 1.0 K/uL   Eosinophils Relative 3 %   Eosinophils Absolute 0.1 0.0 - 0.5 K/uL   Basophils Relative 1 %   Basophils Absolute 0.0 0.0 - 0.1 K/uL   Immature Granulocytes 0 %   Abs Immature Granulocytes 0.00 0.00 - 0.07 K/uL  Protime-INR  Result Value Ref Range   Prothrombin Time 15.2 11.4 - 15.2 seconds    INR 1.2 0.8 - 1.2  APTT  Result Value Ref Range   aPTT 29 24 - 36 seconds  Urinalysis, Routine w reflex microscopic  Result Value Ref Range   Color, Urine YELLOW YELLOW   APPearance HAZY (A) CLEAR   Specific Gravity, Urine 1.010 1.005 - 1.030   pH 5.0 5.0 - 8.0   Glucose, UA NEGATIVE NEGATIVE mg/dL   Hgb urine dipstick LARGE (A) NEGATIVE   Bilirubin Urine NEGATIVE NEGATIVE   Ketones, ur NEGATIVE NEGATIVE mg/dL   Protein, ur 100 (A) NEGATIVE mg/dL   Nitrite NEGATIVE NEGATIVE   Leukocytes,Ua NEGATIVE NEGATIVE   RBC / HPF >50 (H) 0 - 5 RBC/hpf   WBC, UA 11-20 0 - 5 WBC/hpf   Bacteria, UA NONE SEEN NONE SEEN   Squamous Epithelial / LPF 0-5 0 - 5   Mucus PRESENT    Hyaline Casts, UA PRESENT   Protime-INR  Result Value Ref Range   Prothrombin Time 15.1 11.4 - 15.2 seconds   INR 1.2 0.8 - 1.2  Cortisol-am, blood  Result Value Ref Range   Cortisol - AM 8.1 6.7 - 22.6 ug/dL  Procalcitonin  Result Value Ref Range   Procalcitonin <0.10 ng/mL  Comprehensive metabolic panel  Result Value Ref Range   Sodium 135 135 - 145 mmol/L   Potassium 5.7 (H) 3.5 - 5.1 mmol/L   Chloride 107 98 - 111 mmol/L   CO2 22 22 - 32 mmol/L   Glucose, Bld 102 (H) 70 - 99 mg/dL   BUN 44 (H) 8 - 23 mg/dL  Creatinine, Ser 1.69 (H) 0.61 - 1.24 mg/dL   Calcium 8.6 (L) 8.9 - 10.3 mg/dL   Total Protein 6.1 (L) 6.5 - 8.1 g/dL   Albumin 3.0 (L) 3.5 - 5.0 g/dL   AST 28 15 - 41 U/L   ALT 10 0 - 44 U/L   Alkaline Phosphatase 148 (H) 38 - 126 U/L   Total Bilirubin 0.9 0.3 - 1.2 mg/dL   GFR, Estimated 36 (L) >60 mL/min   Anion gap 6 5 - 15  Magnesium  Result Value Ref Range   Magnesium 2.3 1.7 - 2.4 mg/dL  CBC WITH DIFFERENTIAL  Result Value Ref Range   WBC 3.9 (L) 4.0 - 10.5 K/uL   RBC 3.61 (L) 4.22 - 5.81 MIL/uL   Hemoglobin 9.7 (L) 13.0 - 17.0 g/dL   HCT 31.8 (L) 39.0 - 52.0 %   MCV 88.1 80.0 - 100.0 fL   MCH 26.9 26.0 - 34.0 pg   MCHC 30.5 30.0 - 36.0 g/dL   RDW 16.5 (H) 11.5 - 15.5 %    Platelets 122 (L) 150 - 400 K/uL   nRBC 0.0 0.0 - 0.2 %   Neutrophils Relative % 60 %   Neutro Abs 2.3 1.7 - 7.7 K/uL   Lymphocytes Relative 30 %   Lymphs Abs 1.2 0.7 - 4.0 K/uL   Monocytes Relative 8 %   Monocytes Absolute 0.3 0.1 - 1.0 K/uL   Eosinophils Relative 2 %   Eosinophils Absolute 0.1 0.0 - 0.5 K/uL   Basophils Relative 0 %   Basophils Absolute 0.0 0.0 - 0.1 K/uL   Immature Granulocytes 0 %   Abs Immature Granulocytes 0.01 0.00 - 0.07 K/uL  TSH  Result Value Ref Range   TSH 1.159 0.350 - 4.500 uIU/mL  Basic metabolic panel  Result Value Ref Range   Sodium 135 135 - 145 mmol/L   Potassium 5.3 (H) 3.5 - 5.1 mmol/L   Chloride 107 98 - 111 mmol/L   CO2 20 (L) 22 - 32 mmol/L   Glucose, Bld 106 (H) 70 - 99 mg/dL   BUN 40 (H) 8 - 23 mg/dL   Creatinine, Ser 1.43 (H) 0.61 - 1.24 mg/dL   Calcium 8.7 (L) 8.9 - 10.3 mg/dL   GFR, Estimated 45 (L) >60 mL/min   Anion gap 8 5 - 15  Ammonia  Result Value Ref Range   Ammonia 16 9 - 35 umol/L  VITAMIN D 25 Hydroxy (Vit-D Deficiency, Fractures)  Result Value Ref Range   Vit D, 25-Hydroxy 25.01 (L) 30 - 100 ng/mL  Basic metabolic panel  Result Value Ref Range   Sodium 136 135 - 145 mmol/L   Potassium 4.3 3.5 - 5.1 mmol/L   Chloride 104 98 - 111 mmol/L   CO2 25 22 - 32 mmol/L   Glucose, Bld 110 (H) 70 - 99 mg/dL   BUN 36 (H) 8 - 23 mg/dL   Creatinine, Ser 1.19 0.61 - 1.24 mg/dL   Calcium 8.9 8.9 - 10.3 mg/dL   GFR, Estimated 56 (L) >60 mL/min   Anion gap 7 5 - 15  CBC  Result Value Ref Range   WBC 3.7 (L) 4.0 - 10.5 K/uL   RBC 3.59 (L) 4.22 - 5.81 MIL/uL   Hemoglobin 9.8 (L) 13.0 - 17.0 g/dL   HCT 30.9 (L) 39.0 - 52.0 %   MCV 86.1 80.0 - 100.0 fL   MCH 27.3 26.0 - 34.0 pg   MCHC 31.7 30.0 - 36.0  g/dL   RDW 16.6 (H) 11.5 - 15.5 %   Platelets 159 150 - 400 K/uL   nRBC 0.0 0.0 - 0.2 %  CBG monitoring, ED  Result Value Ref Range   Glucose-Capillary 110 (H) 70 - 99 mg/dL   *Note: Due to a large number of results and/or  encounters for the requested time period, some results have not been displayed. A complete set of results can be found in Results Review.       Pertinent labs & imaging results that were available during my care of the patient were reviewed by me and considered in my medical decision making.  Assessment & Plan:  Talin was seen today for hospitalization follow-up.  Diagnoses and all orders for this visit:  Hospital discharge follow-up Today's visit was for Transitional Care Management. The patient was discharged from [facility] on 02/22/2021 with a primary diagnosis of acute metabolic encephalopathy, chronic systolic heart failure, acute lower urinary tract infection, acute kidney injury, hypotension due to hypovolemia, sepsis, protein calorie malnutrition, altered mental status, and seizures.   Contact with the patient and/or caregiver, by a clinical staff member, was made on 02/25/2021 and was documented as a telephone encounter within the EMR.  Through chart review and discussion with the patient I have determined that management of their condition is of high complexity.   Will repeat below labs.  -     BMP8+EGFR -     CBC with Differential/Platelet -     Urine Culture  AKI (acute kidney injury) ([place]) Repeat labs. Losartan decreased to 25 mg daily.  -     BMP8+EGFR -     CBC with Differential/Platelet  Acute metabolic encephalopathy Improved. Will repeat below labs.  -     BMP8+EGFR -     CBC with Differential/Platelet  Chronic combined systolic and diastolic heart failure (HCC) No indications of volume overload in office today.  Has follow up with cardiology scheduled. -     BMP8+EGFR  Atrial fibrillation, chronic (HCC) Has follow-up with cardiology scheduled.  Heart rate low normal, discussed decreasing or discontinuing carvedilol.  Sanjit discussed with cardiology. -     CBC with Differential/Platelet  Recurrent UTI Will repeat urine culture. -     Urine  Culture  Hypotension due to hypovolemia Hydration encouraged at home.  Blood pressure normal today.  Labs pending. -     BMP8+EGFR  Alzheimer disease ([place]) Caregiver and son reports significant fatigue during the day, will decrease nightly Aricept 5 mg to see if beneficial. -     donepezil (ARICEPT) 5 MG tablet; Take 1 tablet (5 mg total) by mouth at bedtime.  Essential hypertension Normal blood pressure in office today, losartan 25 mg daily. -     losartan (COZAAR) 25 MG tablet; Take 1 tablet (25 mg total) by mouth daily.  Pain, phantom limb (HCC) Syndrome caregiver reports significant fatigue during the day.  We will decrease gabapentin to 200 mg twice daily versus 400 mg twice daily to see if beneficial. -     gabapentin (NEURONTIN) 100 MG capsule; Take 2 capsules (200 mg total) by mouth 2 (two) times daily.    Long discussion today with patient's son and caregiver about risk and benefits of medications and simplify medication regimen.  Discussed discontinuing Eliquis, patient's son would like to continue as he feels his risk of falling is low.  Discussed possibly discontinuing carvedilol as patient's heart rate is low normal on a regular basis.  Discussed  potentially stopping statin therapy due to increasing pains of myalgias and arthralgias recently.  Discussed stopping Protonix but patient's son wishes to continue Eliquis.  Gabapentin, losartan, and Aricept dose is decreased in office today.  Patient's son reports possible seizure activity recently, will continue Keppra.  Patient has ongoing incontinence issues, will not adjust Myrbetriq or Flomax.   Follow up plan: Return in about 4 weeks (around 04/01/2021).   The above assessment and management plan was discussed with the patient. The patient verbalized understanding of and has agreed to the management plan. Patient is aware to call the clinic if they develop any new symptoms or if symptoms persist or worsen. Patient is aware when  to return to the clinic for a follow-up visit. Patient educated on when it is appropriate to go to the emergency department.   [name], FNP-C [facility] [phone]

## 2021-03-04 NOTE — Progress Notes (Signed)
Chronic Care Management Pharmacy Note  02/27/2021 Name:  Scott [name] MRN:  [id number] DOB:  14-May-1921  Summary: AFIB  Recommendations/Changes made from today's visit:  Atrial Fibrillation: Controlled; current rate/rhythm control-CARVEDILOL; anticoagulant treatment: ELIQUIS 2.5MG CHADS2VASc score: 5 Home blood pressure, heart rate readings:  Recommended CONTINUE ELIQUIS; SAMPLES GIVEN Assessed patient finances. WILL APPLY FOR BMS PATIENT ASSISTANCE PROGRAM--AWAITING PATIENT FINANCIALS DENIES SIGNS/SYMPTOMS OF BLEEDING; STABLE; LABS REVIEWED  HYPERLIPIDEMIA CONTROLLED ON CURRENT REGIMEN--SIMVASTATIN  Patient Goals/Self-Care Activities patient will:  - take medications as prescribed as evidenced by patient report and record review collaborate with provider on medication access solutions  Subjective: Scott [name] is an 87 y.o. year old male who is a primary patient of Dettinger, Fransisca Kaufmann, MD.  The CCM team was consulted for assistance with disease management and care coordination needs.    Engaged with patient by telephone for initial visit in response to provider referral for pharmacy case management and/or care coordination services.   Consent to Services:  The patient was given information about Chronic Care Management services, agreed to services, and gave verbal consent prior to initiation of services.  Please see initial visit note for detailed documentation.   Patient Care Team: Dettinger, Fransisca Kaufmann, MD as PCP - General (Family Medicine) Minus Breeding, MD as PCP - Cardiology (Cardiology) Thompson Grayer, MD as Consulting Physician (Cardiology) Early, Arvilla Meres, MD as Consulting Physician (Vascular Surgery) Minus Breeding, MD as Consulting Physician (Cardiology) Josue Hector, MD as Consulting Physician (Cardiology) Lavera Guise, [facility] as [place] Management (Pharmacist)   Objective:  Lab Results  Component Value Date   CREATININE 1.19  02/22/2021   CREATININE 1.43 (H) 02/21/2021   CREATININE 1.69 (H) 02/21/2021    Lab Results  Component Value Date   HGBA1C 5.8 (H) 11/28/2015   Last diabetic Eye exam: No results found for: HMDIABEYEEXA  Last diabetic Foot exam: No results found for: HMDIABFOOTEX      Component Value Date/Time   CHOL 108 07/21/2020 1543   TRIG 89 07/21/2020 1543   TRIG 194 (H) 03/26/2014 1446   HDL 39 (L) 07/21/2020 1543   HDL 35 (L) 03/26/2014 1446   CHOLHDL 2.8 07/21/2020 1543   CHOLHDL 2.6 11/02/2012 1310   VLDL 44 (H) 11/02/2012 1310   LDLCALC 52 07/21/2020 1543   LDLCALC 42 05/17/2013 1101    Hepatic Function Latest Ref Rng & Units 02/21/2021 02/20/2021 01/19/2021  Total Protein 6.5 - 8.1 g/dL 6.1(L) 6.5 6.5  Albumin 3.5 - 5.0 g/dL 3.0(L) 3.3(L) 3.3(L)  AST 15 - 41 U/L _0 ALT 0 - 44 U/L _1 Alk Phosphatase 38 - 126 U/L 148(H) 159(H) 164(H)  Total Bilirubin 0.3 - 1.2 mg/dL 0.9 0.2(L) 0.2  Bilirubin, Direct 0.0 - 0.2 mg/dL - - -    Lab Results  Component Value Date/Time   TSH 1.159 02/21/2021 04:41 AM   TSH 1.387 11/28/2015 03:11 PM   TSH 1.192 03/15/2013 04:03 AM   TSH 1.654 12/25/2010 09:45 PM    CBC Latest Ref Rng & Units 02/22/2021 02/21/2021 02/20/2021  WBC 4.0 - 10.5 K/uL 3.7(L) 3.9(L) 4.3  Hemoglobin 13.0 - 17.0 g/dL 9.8(L) 9.7(L) 10.3(L)  Hematocrit 39.0 - 52.0 % 30.9(L) 31.8(L) 32.8(L)  Platelets 150 - 400 K/uL 159 122(L) 178    Lab Results  Component Value Date/Time   VD25OH 25.01 (L) 02/21/2021 09:37 AM   VD25OH 21.6 (L) 08/03/2016 11:18 AM    Clinical  ASCVD: Yes  The ASCVD Risk score (Arnett DK, et al., 2019) failed to calculate for the following reasons:   The 2019 ASCVD risk score is only valid for ages 51 to 61   The patient has a prior MI or stroke diagnosis    Other: (CHADS2VASc if Afib, PHQ9 if depression, MMRC or CAT for COPD, ACT, DEXA)  Social History   Tobacco Use  Smoking Status Former   Packs/day: 1.00   Years: 35.00   Pack  years: 35.00   Types: Cigarettes   Start date: 04/12/1942   Quit date: 04/13/1975   Years since quitting: 45.9  Smokeless Tobacco Never   BP Readings from Last 3 Encounters:  02/22/21 (!) 154/111  02/12/21 (!) 89/49  01/19/21 (!) 100/55   Pulse Readings from Last 3 Encounters:  02/22/21 89  02/12/21 (!) 59  01/19/21 (!) 120   Wt Readings from Last 3 Encounters:  02/20/21 160 lb 15 oz (73 kg)  02/03/21 160 lb (72.6 kg)  10/02/20 160 lb (72.6 kg)    Assessment: Review of patient past medical history, allergies, medications, health status, including review of consultants reports, laboratory and other test data, was performed as part of comprehensive evaluation and provision of chronic care management services.   SDOH:  (Social Determinants of Health) assessments and interventions performed:    CCM Care Plan  Allergies  Allergen Reactions   Atorvastatin Hives and Rash   Rocephin [Ceftriaxone Sodium In Dextrose] Other (See Comments)    Recurrent seizures    Medications Reviewed Today     Reviewed by Lavera Guise, [facility] (Pharmacist) on 03/04/21 at 1324  Med List Status: <None>   Medication Order Taking? Sig Documenting Provider Last Dose Status Informant  acetaminophen (TYLENOL) 500 MG tablet [id number] No Take 1,000 mg by mouth every 6 (six) hours as needed for moderate pain or headache. [provider] 02/21/2021 Active Family Member  albuterol (PROVENTIL) (2.5 MG/3ML) 0.083% nebulizer solution [id number] No Take 3 mLs (2.5 mg total) by nebulization every 6 (six) hours as needed for wheezing or shortness of breath. Dettinger, Fransisca Kaufmann, MD 02/21/2021 Active Family Member  albuterol (VENTOLIN HFA) 108 (90 Base) MCG/ACT inhaler [id number] No Inhale 2 puffs into the lungs every 6 (six) hours as needed for wheezing or shortness of breath. Dettinger, Fransisca Kaufmann, MD 02/21/2021 Active Family Member  aspirin EC 81 MG EC tablet [id number] No Take 1 tablet (81 mg total) by mouth daily.  Swallow whole. Manuella Ghazi, Pratik D, DO 02/21/2021 Active Family Member  carvedilol (COREG) 3.125 MG tablet [id number]  Take 1 tablet (3.125 mg total) by mouth 2 (two) times daily with a meal. Barton Dubois, MD  Active   donepezil (ARICEPT) 10 MG tablet [id number] No Take 1 tablet (10 mg total) by mouth at bedtime. Dettinger, Fransisca Kaufmann, MD 02/21/2021 Active Family Member  ELIQUIS 2.5 MG TABS tablet [id number]  TAKE (1) TABLET TWICE A DAY. Dettinger, Fransisca Kaufmann, MD  Active   febuxostat (ULORIC) 40 MG tablet [id number] No Take 1 tablet (40 mg total) by mouth daily. Dettinger, Fransisca Kaufmann, MD 02/21/2021 Active Family Member  fluticasone (FLONASE) 50 MCG/ACT nasal spray [id number] No Place 1 spray into both nostrils 2 (two) times daily as needed for allergies or rhinitis. SPRAY 1 SPRAY IN EACH NOSTRIL ONCE DAILY. Dettinger, Fransisca Kaufmann, MD 02/21/2021 Active Family Member  furosemide (LASIX) 20 MG tablet [id number] No TAKE 1 TABLET DAILY AS NEEDED (signs of volume overload). Minus Breeding, MD 02/21/2021 Active Family Member  gabapentin (NEURONTIN) 400 MG capsule [id number] No TAKE 1 CAPSULE 2 TIMES A DAY Dettinger, Fransisca Kaufmann, MD 02/21/2021 Active Family Member  Ipratropium-Albuterol (COMBIVENT RESPIMAT) 20-100 MCG/ACT AERS respimat [id number] No Inhale 1 puff into the lungs every 6 (six) hours as needed for wheezing or shortness of breath (As needed for wheezing , cough or shortness of breath). Dettinger, Fransisca Kaufmann, MD 02/21/2021 Active Family Member  levETIRAcetam (KEPPRA) 250 MG tablet [id number] No TAKE ONE TABLET TWICE DAILY Dettinger, Fransisca Kaufmann, MD 02/21/2021 Active Family Member  losartan (COZAAR) 50 MG tablet [id number]  Take 0.5 tablets (25 mg total) by mouth daily. Barton Dubois, MD  Active   MYRBETRIQ 25 MG TB24 tablet [id number] No Take 1 tablet (25 mg total) by mouth daily. Cleon Gustin, MD 02/21/2021 Active Family Member  pantoprazole (PROTONIX) 40 MG tablet [id number] No Take 1 tablet (40 mg total) by mouth daily.  Dettinger, Fransisca Kaufmann, MD 02/21/2021 Active Family Member  sertraline (ZOLOFT) 50 MG tablet [id number] No Take 1 tablet (50 mg total) by mouth daily. Dettinger, Fransisca Kaufmann, MD 02/21/2021 Active Family Member  simvastatin (ZOCOR) 10 MG tablet [id number] No Take 1 tablet (10 mg total) by mouth daily at 6 PM. Dettinger, Fransisca Kaufmann, MD 02/21/2021 Active Family Member  tamsulosin (FLOMAX) 0.4 MG CAPS capsule [id number] No TAKE 1 CAPSULE DAILY Dettinger, Fransisca Kaufmann, MD 02/21/2021 Active Family Member            Patient Active Problem List   Diagnosis Date Noted   Hypotension due to hypovolemia    Sepsis ([place]) 87/68/1157   Acute metabolic encephalopathy 26/20/3559   Atrial fibrillation, chronic ([place]) 02/21/2021   Protein calorie malnutrition ([place]) 02/21/2021   AMS (altered mental status) 02/21/2021   Chronic hypoxemic respiratory failure ([place]) 01/06/2021   COPD (chronic obstructive pulmonary disease) with chronic bronchitis ([place]) 01/05/2021   Seizure ([place]) 08/16/2020   Bladder cancer ([place]) 03/04/2020   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   Alzheimer disease ([place]) 09/07/2018   Paroxysmal atrial fibrillation ([place]) 01/18/2017   PVC (pulmonary venous congestion) 01/18/2017   Thoracic aortic atherosclerosis ([place]) 11/26/2016   Essential hypertension 11/29/2015   Malignant neoplasm of lateral wall of urinary bladder ([place]) 07/10/2015   Status post above-knee amputation of left lower extremity ([place]) 10/18/2014   Acute lower UTI 10/03/2014   Atherosclerotic PVD with ulceration ([place]) 10/03/2014   AKI (acute kidney injury) ([place]) 10/03/2014   PAD (peripheral artery disease) ([place]) 09/24/2014   History of non-ST elevation myocardial infarction (NSTEMI) 10/09/2013   Vitamin D deficiency 05/17/2013   History of pulmonary embolism (2014) 04/17/2013   NSVT (nonsustained ventricular tachycardia)- not an ICD candidate 04/17/2013   NICM (nonischemic cardiomyopathy)- EF 20-25% by Echo 04/12/13 04/17/2013    Chronic systolic HF (heart failure) ([place]) 03/15/2013   History of DVT (deep vein thrombosis) 03/15/2013   Benign localized hyperplasia of prostate with urinary obstruction 03/15/2012   Hyperlipidemia    PVCs (premature ventricular contractions)    CAD- non obstructive disease by cath 3/14     Immunization History  Administered Date(s) Administered   Fluad Quad(high Dose 65+) 01/10/2020, 01/19/2021   Influenza, High Dose Seasonal PF 01/19/2016, 03/28/2017, 02/13/2018   Influenza,inj,Quad PF,6+ Mos 02/12/2013, 01/31/2014, 01/14/2015   Influenza-Unspecified 01/25/2012   Moderna Sars-Covid-2 Vaccination 07/28/2020, 01/25/2021   Pneumococcal Conjugate-13 05/17/2013   Pneumococcal Polysaccharide-23 04/12/1994   Zoster Recombinat (Shingrix) 01/10/2020    Conditions to be addressed/monitored: Atrial Fibrillation and HLD  Care Plan : PHARMD MEDICATION MANAGEMENT  Updates made  by Lavera Guise, [place] since 03/04/2021 12:00 AM     Problem: DISEASE PROGRESSION PREVENTION      Long-Range Goal: ATRIAL FIBRILLATION   Note:   Current Barriers:  Unable to independently afford treatment regimen Pharmacist Clinical Goal(s):  patient will verbalize ability to afford treatment regimen through collaboration with PharmD and provider.   Interventions: 1:1 collaboration with Dettinger, Fransisca Kaufmann, MD regarding development and update of comprehensive plan of care as evidenced by provider attestation and co-signature Inter-disciplinary care team collaboration (see longitudinal plan of care) Comprehensive medication review performed; medication list updated in electronic medical record  Atrial Fibrillation: Controlled; current rate/rhythm control-CARVEDILOL; anticoagulant treatment: ELIQUIS 2.5MG CHADS2VASc score: 5 Home blood pressure, heart rate readings:  Recommended CONTINUE ELIQUIS; SAMPLES GIVEN Assessed patient finances. WILL APPLY FOR BMS PATIENT ASSISTANCE PROGRAM--AWAITING PATIENT  FINANCIALS DENIES SIGNS/SYMPTOMS OF BLEEDING; STABLE; LABS REVIEWED HYPERLIPIDEMIA CONTROLLED ON CURRENT REGIMEN--SIMVASTATIN  Patient Goals/Self-Care Activities patient will:  - take medications as prescribed as evidenced by patient report and record review collaborate with provider on medication access solutions      Medication Assistance: Application for ELIQUIS  medication assistance program. in process.  Anticipated assistance start date TBD-AWAITING PATIENT FINANCIALS.  See plan of care for additional detail.  Patient's preferred pharmacy is:  [facility] [place] [address] Phone: [phone] Fax: [phone]  Follow Up:  Patient agrees to Care Plan and Follow-up.  Plan: The patient will call PHARMD as advised to [place].    .JDWP

## 2021-03-05 LAB — CBC WITH DIFFERENTIAL/PLATELET
Basophils Absolute: 0 10*3/uL (ref 0.0–0.2)
Basos: 1 %
EOS (ABSOLUTE): 0.1 10*3/uL (ref 0.0–0.4)
Eos: 3 %
Hematocrit: 33 % — ABNORMAL LOW (ref 37.5–51.0)
Hemoglobin: 10.1 g/dL — ABNORMAL LOW (ref 13.0–17.7)
Immature Grans (Abs): 0 10*3/uL (ref 0.0–0.1)
Immature Granulocytes: 0 %
Lymphocytes Absolute: 1.1 10*3/uL (ref 0.7–3.1)
Lymphs: 30 %
MCH: 26.2 pg — ABNORMAL LOW (ref 26.6–33.0)
MCHC: 30.6 g/dL — ABNORMAL LOW (ref 31.5–35.7)
MCV: 86 fL (ref 79–97)
Monocytes Absolute: 0.3 10*3/uL (ref 0.1–0.9)
Monocytes: 9 %
Neutrophils Absolute: 2.1 10*3/uL (ref 1.4–7.0)
Neutrophils: 57 %
Platelets: 183 10*3/uL (ref 150–450)
RBC: 3.86 x10E6/uL — ABNORMAL LOW (ref 4.14–5.80)
RDW: 15.6 % — ABNORMAL HIGH (ref 11.6–15.4)
WBC: 3.8 10*3/uL (ref 3.4–10.8)

## 2021-03-05 LAB — BMP8+EGFR
BUN/Creatinine Ratio: 18 (ref 10–24)
BUN: 23 mg/dL (ref 10–36)
CO2: 29 mmol/L (ref 20–29)
Calcium: 9.2 mg/dL (ref 8.6–10.2)
Chloride: 106 mmol/L (ref 96–106)
Creatinine, Ser: 1.28 mg/dL — ABNORMAL HIGH (ref 0.76–1.27)
Glucose: 114 mg/dL — ABNORMAL HIGH (ref 70–99)
Potassium: 4.4 mmol/L (ref 3.5–5.2)
Sodium: 145 mmol/L — ABNORMAL HIGH (ref 134–144)
eGFR: 51 mL/min/{1.73_m2} — ABNORMAL LOW (ref >59)

## 2021-03-06 DIAGNOSIS — I110 Hypertensive heart disease with heart failure: Secondary | ICD-10-CM | POA: Diagnosis not present

## 2021-03-06 DIAGNOSIS — J189 Pneumonia, unspecified organism: Secondary | ICD-10-CM | POA: Diagnosis not present

## 2021-03-06 DIAGNOSIS — J440 Chronic obstructive pulmonary disease with acute lower respiratory infection: Secondary | ICD-10-CM | POA: Diagnosis not present

## 2021-03-06 DIAGNOSIS — N3000 Acute cystitis without hematuria: Secondary | ICD-10-CM | POA: Diagnosis not present

## 2021-03-06 DIAGNOSIS — I5042 Chronic combined systolic (congestive) and diastolic (congestive) heart failure: Secondary | ICD-10-CM | POA: Diagnosis not present

## 2021-03-06 DIAGNOSIS — J9611 Chronic respiratory failure with hypoxia: Secondary | ICD-10-CM | POA: Diagnosis not present

## 2021-03-09 DIAGNOSIS — G40909 Epilepsy, unspecified, not intractable, without status epilepticus: Secondary | ICD-10-CM | POA: Diagnosis not present

## 2021-03-09 DIAGNOSIS — F32A Depression, unspecified: Secondary | ICD-10-CM | POA: Diagnosis not present

## 2021-03-09 DIAGNOSIS — F419 Anxiety disorder, unspecified: Secondary | ICD-10-CM | POA: Diagnosis not present

## 2021-03-09 DIAGNOSIS — I447 Left bundle-branch block, unspecified: Secondary | ICD-10-CM | POA: Diagnosis not present

## 2021-03-09 DIAGNOSIS — Z9981 Dependence on supplemental oxygen: Secondary | ICD-10-CM | POA: Diagnosis not present

## 2021-03-09 DIAGNOSIS — M160 Bilateral primary osteoarthritis of hip: Secondary | ICD-10-CM | POA: Diagnosis not present

## 2021-03-09 DIAGNOSIS — Z7951 Long term (current) use of inhaled steroids: Secondary | ICD-10-CM | POA: Diagnosis not present

## 2021-03-09 DIAGNOSIS — I428 Other cardiomyopathies: Secondary | ICD-10-CM | POA: Diagnosis not present

## 2021-03-09 DIAGNOSIS — Z8701 Personal history of pneumonia (recurrent): Secondary | ICD-10-CM | POA: Diagnosis not present

## 2021-03-09 DIAGNOSIS — I482 Chronic atrial fibrillation, unspecified: Secondary | ICD-10-CM | POA: Diagnosis not present

## 2021-03-09 DIAGNOSIS — E440 Moderate protein-calorie malnutrition: Secondary | ICD-10-CM | POA: Diagnosis not present

## 2021-03-09 DIAGNOSIS — I2510 Atherosclerotic heart disease of native coronary artery without angina pectoris: Secondary | ICD-10-CM | POA: Diagnosis not present

## 2021-03-09 DIAGNOSIS — J449 Chronic obstructive pulmonary disease, unspecified: Secondary | ICD-10-CM | POA: Diagnosis not present

## 2021-03-09 DIAGNOSIS — I739 Peripheral vascular disease, unspecified: Secondary | ICD-10-CM | POA: Diagnosis not present

## 2021-03-09 DIAGNOSIS — F0280 Dementia in other diseases classified elsewhere without behavioral disturbance: Secondary | ICD-10-CM | POA: Diagnosis not present

## 2021-03-09 DIAGNOSIS — I110 Hypertensive heart disease with heart failure: Secondary | ICD-10-CM | POA: Diagnosis not present

## 2021-03-09 DIAGNOSIS — N400 Enlarged prostate without lower urinary tract symptoms: Secondary | ICD-10-CM | POA: Diagnosis not present

## 2021-03-09 DIAGNOSIS — I5022 Chronic systolic (congestive) heart failure: Secondary | ICD-10-CM | POA: Diagnosis not present

## 2021-03-09 DIAGNOSIS — Z87440 Personal history of urinary (tract) infections: Secondary | ICD-10-CM | POA: Diagnosis not present

## 2021-03-09 DIAGNOSIS — K219 Gastro-esophageal reflux disease without esophagitis: Secondary | ICD-10-CM | POA: Diagnosis not present

## 2021-03-09 DIAGNOSIS — M47816 Spondylosis without myelopathy or radiculopathy, lumbar region: Secondary | ICD-10-CM | POA: Diagnosis not present

## 2021-03-09 DIAGNOSIS — E785 Hyperlipidemia, unspecified: Secondary | ICD-10-CM | POA: Diagnosis not present

## 2021-03-09 DIAGNOSIS — J9611 Chronic respiratory failure with hypoxia: Secondary | ICD-10-CM | POA: Diagnosis not present

## 2021-03-09 DIAGNOSIS — Z86711 Personal history of pulmonary embolism: Secondary | ICD-10-CM | POA: Diagnosis not present

## 2021-03-09 DIAGNOSIS — Z7901 Long term (current) use of anticoagulants: Secondary | ICD-10-CM | POA: Diagnosis not present

## 2021-03-11 DIAGNOSIS — I110 Hypertensive heart disease with heart failure: Secondary | ICD-10-CM | POA: Diagnosis not present

## 2021-03-11 DIAGNOSIS — I5022 Chronic systolic (congestive) heart failure: Secondary | ICD-10-CM | POA: Diagnosis not present

## 2021-03-11 DIAGNOSIS — I482 Chronic atrial fibrillation, unspecified: Secondary | ICD-10-CM | POA: Diagnosis not present

## 2021-03-11 DIAGNOSIS — E785 Hyperlipidemia, unspecified: Secondary | ICD-10-CM

## 2021-03-11 DIAGNOSIS — J9611 Chronic respiratory failure with hypoxia: Secondary | ICD-10-CM | POA: Diagnosis not present

## 2021-03-11 DIAGNOSIS — J449 Chronic obstructive pulmonary disease, unspecified: Secondary | ICD-10-CM | POA: Diagnosis not present

## 2021-03-11 DIAGNOSIS — I4891 Unspecified atrial fibrillation: Secondary | ICD-10-CM

## 2021-03-11 DIAGNOSIS — E440 Moderate protein-calorie malnutrition: Secondary | ICD-10-CM | POA: Diagnosis not present

## 2021-03-17 ENCOUNTER — Other Ambulatory Visit: Payer: Self-pay | Admitting: Family Medicine

## 2021-03-19 DIAGNOSIS — E440 Moderate protein-calorie malnutrition: Secondary | ICD-10-CM | POA: Diagnosis not present

## 2021-03-19 DIAGNOSIS — I5022 Chronic systolic (congestive) heart failure: Secondary | ICD-10-CM | POA: Diagnosis not present

## 2021-03-19 DIAGNOSIS — I110 Hypertensive heart disease with heart failure: Secondary | ICD-10-CM | POA: Diagnosis not present

## 2021-03-19 DIAGNOSIS — J9611 Chronic respiratory failure with hypoxia: Secondary | ICD-10-CM | POA: Diagnosis not present

## 2021-03-19 DIAGNOSIS — J449 Chronic obstructive pulmonary disease, unspecified: Secondary | ICD-10-CM | POA: Diagnosis not present

## 2021-03-19 DIAGNOSIS — I482 Chronic atrial fibrillation, unspecified: Secondary | ICD-10-CM | POA: Diagnosis not present

## 2021-03-22 NOTE — Progress Notes (Signed)
Cardiology Office Note   Date:  03/24/2021   ID:  Scott [name], DOB January 20, 1922, MRN [id number]  PCP:  Dettinger, Fransisca Kaufmann, MD  Cardiologist:   Minus Breeding, MD   Chief Complaint  Patient presents with   Cardiomyopathy       History of Present Illness: Scott [name] is a 87 y.o. male who presents for follow up of a reduced EF, PAF and left AKA.  .    Echocardiogram obtained in 2014 showed EF 20 to 25%.  Previous cardiac catheterization in March 2014 showed patent coronary arteries.  Repeat echocardiogram by 2016 showed EF improved to 45%.  Echocardiogram obtained in August 2020 showed EF 30 to 40%, akinesis of the left ventricular apical anteroseptal wall, akinesis of the left ventricle basal mid inferior wall, RVSP 70 mmHg.  Repeat echocardiogram in March 2021 showed EF 35 to 40% RVSP 56.6 mmHg, mild MR, akinesis of basal inferior segment and hypokinesis of the mid inferior segment.  Echocardiogram obtained during an admission on 9/10/202  showed EF 35%, RVSP 25.1 mmHg, mild to moderately dilated left atrium, mild MR.  He had a bladder tumor resected.    Since I last saw him he was in the hospital with altered mental status.  It was a question of sepsis but there was no source identified.  This was in November and I reviewed these records for this visit.  He has an acute injury to his kidney but this improved.  He was essentially maintained on the meds as listed.  He has had a decline in his slightly although his son said he did rally somewhat.  He had his Aricept and his gabapentin reduced.  He still getting around his 24-hour care in his home. The patient denies any new symptoms such as chest discomfort, neck or arm discomfort. There has been no new shortness of breath, PND or orthopnea. There have been no reported palpitations, presyncope or syncope.   Past Medical History:  Diagnosis Date   Arthritis    "all over"   Atrial fibrillation ([place])    Benign localized prostatic  hyperplasia with lower urinary tract symptoms (LUTS)    Bradycardia 11/28/2015   Severe-HR in the 20s to 30s following intubation for urological procedure.   CAD- non obstructive disease by cath 3/14 cardiologist-  dr Dulce Martian   Chronic lower back pain    COPD (chronic obstructive pulmonary disease) ([place])    DDD (degenerative disc disease)    Depression    Dyspnea    w/ exertion and lying down (raise head)   ETOH abuse    GERD (gastroesophageal reflux disease)    Glaucoma    Glaucoma, both eyes    History of bladder cancer urologist-- dr Jeffie Pollock   first dx 2012--  s/p TURBT's   History of DVT of lower extremity 03/2013   bilateral    History of pulmonary embolus (PE) 04/2013   HTN (hypertension)    Hyperlipidemia    Hypertension    LBBB (left bundle branch block)    chronic   Non-ischemic cardiomyopathy ([place])    last echo 01-18-2017, ef 35-40%   NSVT (nonsustained ventricular tachycardia)    a. NSVT 06/2012; NSVT also seen during 03/2013 adm. b. Med rx. Not candidate for ICD given adv age.   PAD (peripheral artery disease) (HCC)    Popliteal artery aneurysm, bilateral (HCC)    DOCUMENTED CHRONIC PARTIAL OCCLUSION--  PT DENIES CLAUDICATION OR ANY OTHER SYMPTOMS  PVCs (premature ventricular contractions)    PVD (peripheral vascular disease) (HCC)    Right ABI .75, Left .78 (2006)   Syncope 06/2012   a. Felt to be postural syncope related to diuretics 06/2012.   Systolic and diastolic CHF, chronic (HCC) cardiologsit-  dr Wilda Wetherell   a. NICM - patent cors 06/2012, EF 40% at that time. b. 03/2013 eval: EF 20-25%.   Vision loss, left eye    due to glaucoma   Wears glasses     Past Surgical History:  Procedure Laterality Date   AMPUTATION Left 10/16/2014   Procedure: AMPUTATION ABOVE KNEE- LEFT;  Surgeon: Mal Misty, MD;  Location: [facility];  Service: Vascular;  Laterality: Left;   CARDIAC CATHETERIZATION  05-11-2004  DR In[place] PLAQUE/ NORMAL LVF/ EF 55%/   NON-OBSTRUCTIVE LAD 25%   CARDIAC CATHETERIZATION  06/18/2012   dr Yvone Slape   mild luminal irregularities of coronaries, ef 45-50%   CARDIOVASCULAR STRESS TEST  10-15-2010   LOW RISK NUCLEAR STUDY/ NO EVIDENCE OF ISCHEMIA/ NORMAL EF   CATARACT EXTRACTION W/ INTRAOCULAR LENS  IMPLANT, BILATERAL Bilateral    CYSTOSCOPY  10/07/2011   Procedure: CYSTOSCOPY;  Surgeon: Malka So, MD;  Location: [facility];  Service: Urology;  Laterality: N/A;   CYSTOSCOPY N/A 10/20/2018   Procedure: CYSTOSCOPY WITH FULGURATION OF PROSTATIC URETHRAL TUMOR;  Surgeon: [name], MD;  Location: [facility];  Service: Urology;  Laterality: N/A;   CYSTOSCOPY N/A 10/11/2019   Procedure: CYSTOSCOPY;  Surgeon: Cleon Gustin, MD;  Location: [facility];  Service: Urology;  Laterality: N/A;   CYSTOSCOPY W/ RETROGRADES Bilateral 03/04/2020   Procedure: CYSTOSCOPY WITH BILATERAL RETROGRADE;  Surgeon: [name], MD;  Location: [facility];  Service: Urology;  Laterality: Bilateral;   CYSTOSCOPY WITH BIOPSY  03/14/2012   Procedure: CYSTOSCOPY WITH BIOPSY;  Surgeon: Malka So, MD;  Location: [facility];  Service: Urology;  Laterality: N/A;  WITH FULGURATION   CYSTOSCOPY WITH BIOPSY N/A 12/09/2016   Procedure: CYSTOSCOPY WITH BIOPSY AND FULGURATION;  Surgeon: [name], MD;  Location: [facility];  Service: Urology;  Laterality: N/A;   CYSTOSCOPY WITH BIOPSY N/A 12/20/2017   Procedure: CYSTOSCOPY WITH BIOPSY WITH FULGURATION;  Surgeon: [name], MD;  Location[facility];  Service: Urology;  Laterality: N/A;   CYSTOSCOPY WITH BIOPSY N/A 06/09/2018   Procedure: CYSTOSCOPY WITH BIOPSY AND FULGURATION;  Surgeon: [name], MD;  Location: [facility];  Service: Urology;  Laterality: N/A;   CYSTOSCOPY WITH FULGERATION N/A 01/20/2016   Procedure: CYSTOSCOPY, BIOPSY WITH FULGERATION OF URTHRAL TUMOR;  Surgeon: [name], MD;  Location[facility];  Service: Urology;  Laterality: N/A;   CYSTOSCOPY WITH FULGERATION N/A 06/01/2016   Procedure:  CYSTOSCOPY WITH FULGERATION urethral tumors and bladder neck;  Surgeon: [name], MD;  Location[facility];  Service: Urology;  Laterality: N/A;   SHOULDER SURGERY Left 1970's   TONSILLECTOMY     TRANSTHORACIC ECHOCARDIOGRAM  01-18-2017   dr Raylei Losurdo   moderate LVH, ef 35-40%, diffuse hypokinesis/  trivial AR and MR/  mild TR   TRANSURETHRAL RESECTION OF BLADDER TUMOR  10/07/2011   Procedure: TRANSURETHRAL RESECTION OF BLADDER TUMOR (TURBT);  Surgeon: Malka So, MD;  Location: [facility];  Service: Urology;  Laterality: N/A;   TRANSURETHRAL RESECTION OF BLADDER TUMOR N/A 07/10/2015   Procedure: TRANSURETHRAL RESECTION OF BLADDER TUMOR (TURBT), CYSTOSCOPY ;  Surgeon: [name], MD;  [facility];  Service: Urology;  Laterality: N/A;   TRANSURETHRAL RESECTION OF BLADDER TUMOR N/A 10/11/2019   Procedure: TRANSURETHRAL RESECTION OF PROSTATIC URETHRAL TUMOR;  Surgeon: Cleon Gustin, MD;  Location: [facility];  Service: Urology;  Laterality: N/A;   TRANSURETHRAL RESECTION OF BLADDER TUMOR WITH MITOMYCIN-C N/A 03/04/2020   Procedure: TRANSURETHRAL RESECTION OF BLADDER TUMOR WITH GEMCITABINE;  Surgeon: [name], MD;  Location[facility];  Service: Urology;  Laterality: N/A;   TRANSURETHRAL RESECTION OF PROSTATE  03/14/2012   Procedure: TRANSURETHRAL RESECTION OF THE PROSTATE WITH GYRUS INSTRUMENTS;  Surgeon: Malka So, MD;  Location[facility];  Service: Urology;  Laterality: N/A;     Current Outpatient Medications  Medication Sig Dispense Refill   acetaminophen (TYLENOL) 500 MG tablet Take 1,000 mg by mouth every 6 (six) hours as needed for moderate pain or headache.     albuterol (PROVENTIL) (2.5 MG/3ML) 0.083% nebulizer solution Take 3 mLs (2.5 mg total) by nebulization every 6 (six) hours as needed for wheezing or shortness of breath. 75 mL 5   albuterol (VENTOLIN HFA) 108 (90 Base) MCG/ACT inhaler Inhale 2 puffs into the lungs every 6 (six) hours as needed for wheezing or  shortness of breath. 8 g 11   aspirin EC 81 MG EC tablet Take 1 tablet (81 mg total) by mouth daily. Swallow whole. 30 tablet 11   carvedilol (COREG) 3.125 MG tablet Take 1 tablet (3.125 mg total) by mouth 2 (two) times daily with a meal. 60 tablet 3   donepezil (ARICEPT) 5 MG tablet Take 1 tablet (5 mg total) by mouth at bedtime. 90 tablet 1   ELIQUIS 2.5 MG TABS tablet TAKE (1) TABLET TWICE A DAY. 60 tablet 0   febuxostat (ULORIC) 40 MG tablet Take 1 tablet (40 mg total) by mouth daily. 90 tablet 3   fluticasone (FLONASE) 50 MCG/ACT nasal spray Place 1 spray into both nostrils 2 (two) times daily as needed for allergies or rhinitis. SPRAY 1 SPRAY IN EACH NOSTRIL ONCE DAILY. 16 g 11   furosemide (LASIX) 20 MG tablet TAKE 1 TABLET DAILY AS NEEDED (signs of volume overload). 90 tablet 1   gabapentin (NEURONTIN) 100 MG capsule Take 2 capsules (200 mg total) by mouth 2 (two) times daily. 120 capsule 2   Ipratropium-Albuterol (COMBIVENT RESPIMAT) 20-100 MCG/ACT AERS respimat Inhale 1 puff into the lungs every 6 (six) hours as needed for wheezing or shortness of breath (As needed for wheezing , cough or shortness of breath). 4 g 11   levETIRAcetam (KEPPRA) 250 MG tablet TAKE ONE TABLET TWICE DAILY 60 tablet 2   losartan (COZAAR) 25 MG tablet Take 1 tablet (25 mg total) by mouth daily. 90 tablet 0   MYRBETRIQ 25 MG TB24 tablet Take 1 tablet (25 mg total) by mouth daily. 30 tablet 11   pantoprazole (PROTONIX) 40 MG tablet Take 1 tablet (40 mg total) by mouth daily. 90 tablet 3   sertraline (ZOLOFT) 50 MG tablet Take 1 tablet (50 mg total) by mouth daily. 90 tablet 3   simvastatin (ZOCOR) 10 MG tablet Take 1 tablet (10 mg total) by mouth daily at 6 PM. 90 tablet 3   tamsulosin (FLOMAX) 0.4 MG CAPS capsule TAKE 1 CAPSULE DAILY 30 capsule 0   No current facility-administered medications for this visit.    Allergies:   Atorvastatin and Rocephin [ceftriaxone sodium in dextrose]    ROS:  Please see the  history of present illness.   Otherwise, review of systems are positive for none.  All other systems are reviewed and negative.    PHYSICAL EXAM: VS:  BP (!) 112/56 (BP Location: Left Arm, Patient Position: Sitting)   Pulse (!) 58   Ht 5\' 6"  (1.676 m)   SpO2 95% Comment: Pt came in with an emmpty oxygen tank. O2 81% placed on 3L O2. It increased  BMI 25.98 kg/m  , BMI Body mass index is 25.98 kg/m. GENERAL   Chronically ill appearing NECK:  No jugular venous distention at 90 degrees, waveform within normal limits, carotid upstroke brisk and symmetric, no bruits, no thyromegaly LYMPHATICS:  No cervical adenopathy LUNGS:  Clear to auscultation bilaterally BACK:  No CVA tenderness CHEST:  Unremarkable HEART:  S1 and S2 within normal limits, no S3, no S4, no clicks, no rubs, no murmurs ABD:  Positive bowel sounds normal in frequency in pitch, no bruits, no rebound, no guarding, unable to assess midline mass or bruit with the patient seated. EXT:  2 plus pulses throughout, moderate edema, no cyanosis no clubbing, status post amputation SKIN:  No rashes no nodules NEURO:  Cranial nerves II through XII grossly intact, motor grossly intact throughout PSYCH:  Cognitively intact, oriented to person place and time     EKG:  EKG is not ordered today.    Recent Labs: 08/16/2020: B Natriuretic Peptide 1,856.0 02/21/2021: ALT 10; Magnesium 2.3; TSH 1.159 03/04/2021: BUN 23; Creatinine, Ser 1.28; Hemoglobin 10.1; Platelets 183; Potassium 4.4; Sodium 145    Lipid Panel    Component Value Date/Time   CHOL 108 07/21/2020 1543   TRIG 89 07/21/2020 1543   TRIG 194 (H) 03/26/2014 1446   HDL 39 (L) 07/21/2020 1543   HDL 35 (L) 03/26/2014 1446   CHOLHDL 2.8 07/21/2020 1543   CHOLHDL 2.6 11/02/2012 1310   VLDL 44 (H) 11/02/2012 1310   LDLCALC 52 07/21/2020 1543   LDLCALC 42 05/17/2013 1101      Wt Readings from Last 3 Encounters:  02/20/21 160 lb 15 oz (73 kg)  02/03/21 160 lb (72.6 kg)   10/02/20 160 lb (72.6 kg)      Other studies Reviewed: Additional studies/ records that were reviewed today include:   Hospital records Review of the above records demonstrates:  Please see elsewhere in the note.     ASSESSMENT AND PLAN:   Nonischemic cardiomyopathy:    His EF is now down to about 20% on his echo but he remarkably seems to be euvolemic.  His renal function is reasonable.  We talked about as needed dosing of his diuretic and I would not make any other changes.   PAF:   He tolerates low-dose anticoagulation.  No change in therapy.  HYPOXEMIA: He was hypoxic when he came off his oxygen and actually his tanks ran out but he did not look to be in any distress.  We kept him until his oxygen level came back up.  Hypertension: His blood pressure runs low but currently seems to be doing okay.  No change in therapy.   CKD IIIA:   Remarkably his creatinine currently is 1.28.  This was checked late last month.  No change in therapy.    Current medicines are reviewed at length with the patient today.  The patient does not have concerns regarding medicines.  The following changes have been made:  As above  Labs/ tests ordered today include: None  No orders of the defined types were placed in this encounter.    Disposition:   FU with me in  six months and can be virtual.    Signed, Minus Breeding, MD  03/24/2021 1:58 PM    [facility]

## 2021-03-23 ENCOUNTER — Ambulatory Visit: Payer: BC Managed Care – PPO | Admitting: Cardiology

## 2021-03-23 DIAGNOSIS — I110 Hypertensive heart disease with heart failure: Secondary | ICD-10-CM | POA: Diagnosis not present

## 2021-03-23 DIAGNOSIS — J449 Chronic obstructive pulmonary disease, unspecified: Secondary | ICD-10-CM | POA: Diagnosis not present

## 2021-03-23 DIAGNOSIS — J9611 Chronic respiratory failure with hypoxia: Secondary | ICD-10-CM | POA: Diagnosis not present

## 2021-03-23 DIAGNOSIS — E440 Moderate protein-calorie malnutrition: Secondary | ICD-10-CM | POA: Diagnosis not present

## 2021-03-23 DIAGNOSIS — I5022 Chronic systolic (congestive) heart failure: Secondary | ICD-10-CM | POA: Diagnosis not present

## 2021-03-23 DIAGNOSIS — I482 Chronic atrial fibrillation, unspecified: Secondary | ICD-10-CM | POA: Diagnosis not present

## 2021-03-24 ENCOUNTER — Encounter: Payer: Self-pay | Admitting: Cardiology

## 2021-03-24 ENCOUNTER — Ambulatory Visit (INDEPENDENT_AMBULATORY_CARE_PROVIDER_SITE_OTHER): Payer: Medicare Other | Admitting: Cardiology

## 2021-03-24 ENCOUNTER — Other Ambulatory Visit: Payer: Self-pay

## 2021-03-24 VITALS — BP 112/56 | HR 58 | Ht 66.0 in

## 2021-03-24 DIAGNOSIS — I70248 Atherosclerosis of native arteries of left leg with ulceration of other part of lower left leg: Secondary | ICD-10-CM | POA: Diagnosis not present

## 2021-03-24 DIAGNOSIS — I428 Other cardiomyopathies: Secondary | ICD-10-CM

## 2021-03-24 DIAGNOSIS — I10 Essential (primary) hypertension: Secondary | ICD-10-CM | POA: Diagnosis not present

## 2021-03-24 DIAGNOSIS — I70238 Atherosclerosis of native arteries of right leg with ulceration of other part of lower right leg: Secondary | ICD-10-CM | POA: Diagnosis not present

## 2021-03-24 DIAGNOSIS — I480 Paroxysmal atrial fibrillation: Secondary | ICD-10-CM

## 2021-03-24 NOTE — Patient Instructions (Addendum)
Medication Instructions:  Your physician recommends that you continue on your current medications as directed. Please refer to the Current Medication list given to you today.  *If you need a refill on your cardiac medications before your next appointment, please call your pharmacy*   Lab Work: None If you have labs (blood work) drawn today and your tests are completely normal, you will receive your results only by: [place] (if you have MyChart) OR A paper copy in the mail If you have any lab test that is abnormal or we need to change your treatment, we will call you to review the results.   Testing/Procedures: None   Follow-Up: At [facility], you and your health needs are our priority.  As part of our continuing mission to provide you with exceptional heart care, we have created designated Provider Care Teams.  These Care Teams include your primary Cardiologist (physician) and Advanced Practice Providers (APPs -  Physician Assistants and Nurse Practitioners) who all work together to provide you with the care you need, when you need it.  We recommend signing up for the patient portal called "MyChart".  Sign up information is provided on this After Visit Summary.  MyChart is used to connect with patients for Virtual Visits (Telemedicine).  Patients are able to view lab/test results, encounter notes, upcoming appointments, etc.  Non-urgent messages can be sent to your provider as well.   To learn more about what you can do with MyChart, go to [email or web address].    Your next appointment:   6 month(s)  The format for your next appointment:   Virtual Visit   Provider:   Minus Breeding, MD     Other Instructions

## 2021-03-25 DIAGNOSIS — I5042 Chronic combined systolic (congestive) and diastolic (congestive) heart failure: Secondary | ICD-10-CM | POA: Diagnosis not present

## 2021-03-25 DIAGNOSIS — Z515 Encounter for palliative care: Secondary | ICD-10-CM | POA: Diagnosis not present

## 2021-03-25 DIAGNOSIS — F0150 Vascular dementia without behavioral disturbance: Secondary | ICD-10-CM | POA: Diagnosis not present

## 2021-03-30 ENCOUNTER — Ambulatory Visit (INDEPENDENT_AMBULATORY_CARE_PROVIDER_SITE_OTHER): Payer: Medicare Other | Admitting: Family Medicine

## 2021-03-30 ENCOUNTER — Encounter: Payer: Self-pay | Admitting: Family Medicine

## 2021-03-30 DIAGNOSIS — I5042 Chronic combined systolic (congestive) and diastolic (congestive) heart failure: Secondary | ICD-10-CM

## 2021-03-30 DIAGNOSIS — G546 Phantom limb syndrome with pain: Secondary | ICD-10-CM

## 2021-03-30 DIAGNOSIS — I9589 Other hypotension: Secondary | ICD-10-CM | POA: Diagnosis not present

## 2021-03-30 DIAGNOSIS — E861 Hypovolemia: Secondary | ICD-10-CM

## 2021-03-30 DIAGNOSIS — I10 Essential (primary) hypertension: Secondary | ICD-10-CM

## 2021-03-30 DIAGNOSIS — I5022 Chronic systolic (congestive) heart failure: Secondary | ICD-10-CM

## 2021-03-30 MED ORDER — GABAPENTIN 100 MG PO CAPS: 100.0000 mg | ORAL_CAPSULE | Freq: Two times a day (BID) | ORAL | 3 refills | Status: AC

## 2021-03-30 MED ORDER — LOSARTAN POTASSIUM 25 MG PO TABS
12.5000 mg | ORAL_TABLET | Freq: Every day | ORAL | 1 refills | Status: DC
Start: 2021-03-30 — End: 2021-09-16

## 2021-03-30 NOTE — Progress Notes (Signed)
Virtual Visit via telephone Note  I connected with Scott [name] on 03/30/21 at 1601 by telephone and verified that I am speaking with the correct person using two identifiers. Scott [name] is currently located at home and  caregiver Scott [name]  and son Scott [name] are currently with her during visit. The provider, Fransisca Kaufmann Jakori Burkett, MD is located in their office at time of visit.  Call ended at 1627  I discussed the limitations, risks, security and privacy concerns of performing an evaluation and management service by telephone and the availability of in person appointments. I also discussed with the patient that there may be a patient responsible charge related to this service. The patient expressed understanding and agreed to proceed.  128/62 today home reading, HR 64,  History and Present Illness: Patient is sleeping a lot and energy is down. Medications had been adjusted and his energy is not come. His memory is a little worse.  Last month the carvedilol, and donepezil and gabapentin and losartan has decreased.  His cognition has declined recently.    Will decrease BP pills to see if helps   Per aid, visually he is looking better and his level of alertness has been improving over the pas week.   His bp and energy is slightly improved  He is not sleeping as much but still sleeps sufficiently.   1. Chronic systolic HF (heart failure) ([place])   2. Essential hypertension   3. Hypotension due to hypovolemia   4. Chronic combined systolic and diastolic heart failure (HCC)   5. Pain, phantom limb [facility])     Outpatient Encounter Medications as of 03/30/2021  Medication Sig   acetaminophen (TYLENOL) 500 MG tablet Take 1,000 mg by mouth every 6 (six) hours as needed for moderate pain or headache.   albuterol (PROVENTIL) (2.5 MG/3ML) 0.083% nebulizer solution Take 3 mLs (2.5 mg total) by nebulization every 6 (six) hours as needed for wheezing or shortness of breath.   albuterol (VENTOLIN HFA) 108  (90 Base) MCG/ACT inhaler Inhale 2 puffs into the lungs every 6 (six) hours as needed for wheezing or shortness of breath.   aspirin EC 81 MG EC tablet Take 1 tablet (81 mg total) by mouth daily. Swallow whole.   carvedilol (COREG) 3.125 MG tablet Take 1 tablet (3.125 mg total) by mouth 2 (two) times daily with a meal.   donepezil (ARICEPT) 5 MG tablet Take 1 tablet (5 mg total) by mouth at bedtime.   ELIQUIS 2.5 MG TABS tablet TAKE (1) TABLET TWICE A DAY.   febuxostat (ULORIC) 40 MG tablet Take 1 tablet (40 mg total) by mouth daily.   fluticasone (FLONASE) 50 MCG/ACT nasal spray Place 1 spray into both nostrils 2 (two) times daily as needed for allergies or rhinitis. SPRAY 1 SPRAY IN EACH NOSTRIL ONCE DAILY.   furosemide (LASIX) 20 MG tablet TAKE 1 TABLET DAILY AS NEEDED (signs of volume overload).   gabapentin (NEURONTIN) 100 MG capsule Take 1 capsule (100 mg total) by mouth 2 (two) times daily.   Ipratropium-Albuterol (COMBIVENT RESPIMAT) 20-100 MCG/ACT AERS respimat Inhale 1 puff into the lungs every 6 (six) hours as needed for wheezing or shortness of breath (As needed for wheezing , cough or shortness of breath).   levETIRAcetam (KEPPRA) 250 MG tablet TAKE ONE TABLET TWICE DAILY   losartan (COZAAR) 25 MG tablet Take 0.5 tablets (12.5 mg total) by mouth daily.   MYRBETRIQ 25 MG TB24 tablet Take 1 tablet (25 mg total)  by mouth daily.   pantoprazole (PROTONIX) 40 MG tablet Take 1 tablet (40 mg total) by mouth daily.   sertraline (ZOLOFT) 50 MG tablet Take 1 tablet (50 mg total) by mouth daily.   simvastatin (ZOCOR) 10 MG tablet Take 1 tablet (10 mg total) by mouth daily at 6 PM.   tamsulosin (FLOMAX) 0.4 MG CAPS capsule TAKE 1 CAPSULE DAILY   [DISCONTINUED] gabapentin (NEURONTIN) 100 MG capsule Take 2 capsules (200 mg total) by mouth 2 (two) times daily.   [DISCONTINUED] losartan (COZAAR) 25 MG tablet Take 1 tablet (25 mg total) by mouth daily.   No facility-administered encounter medications  on file as of 03/30/2021.    Review of Systems  Constitutional:  Positive for appetite change. Negative for chills and fever.  Respiratory:  Negative for shortness of breath and wheezing.   Cardiovascular:  Negative for chest pain and leg swelling.  Musculoskeletal:  Negative for back pain and gait problem.  Skin:  Negative for rash.  Neurological:  Negative for dizziness.  Psychiatric/Behavioral:  Positive for agitation, confusion and sleep disturbance.   All other systems reviewed and are negative.  Observations/Objective: Patient's caregiver and son provide history because of dementia.  Assessment and Plan: Problem List Items Addressed This Visit       Cardiovascular and Mediastinum   Hypotension due to hypovolemia   Relevant Medications   losartan (COZAAR) 25 MG tablet   Chronic systolic HF (heart failure) (HCC) - Primary   Relevant Medications   losartan (COZAAR) 25 MG tablet   Essential hypertension   Relevant Medications   losartan (COZAAR) 25 MG tablet   Other Visit Diagnoses     Chronic combined systolic and diastolic heart failure (HCC)       Relevant Medications   losartan (COZAAR) 25 MG tablet   Pain, phantom limb (HCC)       Relevant Medications   gabapentin (NEURONTIN) 100 MG capsule       He has not complained about pain and has been more alert.   Will lower losartan and gabapentin to see if helps more with memory coherence Follow up plan: Return if symptoms worsen or fail to improve, for 1-2 month recheck memory and chf.     I discussed the assessment and treatment plan with the patient. The patient was provided an opportunity to ask questions and all were answered. The patient agreed with the plan and demonstrated an understanding of the instructions.   The patient was advised to call back or seek an in-person evaluation if the symptoms worsen or if the condition fails to improve as anticipated.  The above assessment and management plan was  discussed with the patient. The patient verbalized understanding of and has agreed to the management plan. Patient is aware to call the clinic if symptoms persist or worsen. Patient is aware when to return to the clinic for a follow-up visit. Patient educated on when it is appropriate to go to the emergency department.    I provided 26 minutes of non-face-to-face time during this encounter.    Worthy Rancher, MD

## 2021-03-31 ENCOUNTER — Telehealth: Payer: Medicare Other | Admitting: Family Medicine

## 2021-03-31 DIAGNOSIS — I482 Chronic atrial fibrillation, unspecified: Secondary | ICD-10-CM | POA: Diagnosis not present

## 2021-03-31 DIAGNOSIS — I5022 Chronic systolic (congestive) heart failure: Secondary | ICD-10-CM | POA: Diagnosis not present

## 2021-03-31 DIAGNOSIS — I110 Hypertensive heart disease with heart failure: Secondary | ICD-10-CM | POA: Diagnosis not present

## 2021-03-31 DIAGNOSIS — J449 Chronic obstructive pulmonary disease, unspecified: Secondary | ICD-10-CM | POA: Diagnosis not present

## 2021-03-31 DIAGNOSIS — J9611 Chronic respiratory failure with hypoxia: Secondary | ICD-10-CM | POA: Diagnosis not present

## 2021-03-31 DIAGNOSIS — E440 Moderate protein-calorie malnutrition: Secondary | ICD-10-CM | POA: Diagnosis not present

## 2021-04-06 ENCOUNTER — Other Ambulatory Visit: Payer: Self-pay | Admitting: Family Medicine

## 2021-04-14 ENCOUNTER — Ambulatory Visit: Payer: Medicare Other | Admitting: Nurse Practitioner

## 2021-04-17 ENCOUNTER — Other Ambulatory Visit: Payer: Self-pay | Admitting: Family Medicine

## 2021-04-30 ENCOUNTER — Other Ambulatory Visit: Payer: Self-pay | Admitting: Family Medicine

## 2021-05-04 ENCOUNTER — Ambulatory Visit: Payer: Medicare Other | Admitting: Nurse Practitioner

## 2021-05-06 ENCOUNTER — Encounter: Payer: Self-pay | Admitting: Family Medicine

## 2021-05-11 ENCOUNTER — Encounter: Payer: Self-pay | Admitting: Family Medicine

## 2021-05-11 ENCOUNTER — Ambulatory Visit (INDEPENDENT_AMBULATORY_CARE_PROVIDER_SITE_OTHER): Payer: Medicare Other | Admitting: Family Medicine

## 2021-05-11 VITALS — BP 130/67 | HR 55

## 2021-05-11 DIAGNOSIS — F0280 Dementia in other diseases classified elsewhere without behavioral disturbance: Secondary | ICD-10-CM | POA: Diagnosis not present

## 2021-05-11 DIAGNOSIS — G309 Alzheimer's disease, unspecified: Secondary | ICD-10-CM

## 2021-05-11 DIAGNOSIS — H6123 Impacted cerumen, bilateral: Secondary | ICD-10-CM | POA: Diagnosis not present

## 2021-05-11 DIAGNOSIS — Z23 Encounter for immunization: Secondary | ICD-10-CM

## 2021-05-11 DIAGNOSIS — E7800 Pure hypercholesterolemia, unspecified: Secondary | ICD-10-CM | POA: Diagnosis not present

## 2021-05-11 DIAGNOSIS — I10 Essential (primary) hypertension: Secondary | ICD-10-CM | POA: Diagnosis not present

## 2021-05-11 NOTE — Progress Notes (Signed)
BP 130/67    Pulse (!) 55    SpO2 100%    Subjective:   Patient ID: Scott [name], male    DOB: [birth year], 88 y.o.   MRN: [id number]  HPI: CASHTON [name] is a 88 y.o. male presenting on 05/11/2021 for Memory Loss (1-2 month follow up)   HPI Memory loss recheck Patient is coming in for memory loss recheck.  We did back off on some of his blood pressure medicines and his gabapentin to see if that would help more with memory that has not seemed to help.  He denies any major pains or anything so we do not necessarily need to restart them and his blood pressure looks good today.  He has been having decreased hearing consistently and they are worried about either wax or if he needs hearing aids.  He does want to go and try and see about hearing aids and they are going to take him for hearing testing but wanted his cerumen looked at today.  Hypertension Patient is currently on carvedilol and furosemide and losartan, and their blood pressure today is 130/67. Patient denies any lightheadedness or dizziness. Patient denies headaches, blurred vision, chest pains, shortness of breath, or weakness. Denies any side effects from medication and is content with current medication.   Relevant past medical, surgical, family and social history reviewed and updated as indicated. Interim medical history since our last visit reviewed. Allergies and medications reviewed and updated.  Review of Systems  Constitutional:  Negative for chills and fever.  Respiratory:  Negative for shortness of breath and wheezing.   Cardiovascular:  Negative for chest pain and leg swelling.  Musculoskeletal:  Negative for back pain and gait problem.  Skin:  Negative for rash.  Neurological:  Negative for dizziness, weakness and numbness.  All other systems reviewed and are negative.  Per HPI unless specifically indicated above   Allergies as of 05/11/2021       Reactions   Atorvastatin Hives, Rash   Rocephin [ceftriaxone  Sodium In Dextrose] Other (See Comments)   Recurrent seizures        Medication List        Accurate as of May 11, 2021  3:34 PM. If you have any questions, ask your nurse or doctor.          acetaminophen 500 MG tablet Commonly known as: TYLENOL Take 1,000 mg by mouth every 6 (six) hours as needed for moderate pain or headache.   albuterol (2.5 MG/3ML) 0.083% nebulizer solution Commonly known as: PROVENTIL Take 3 mLs (2.5 mg total) by nebulization every 6 (six) hours as needed for wheezing or shortness of breath.   albuterol 108 (90 Base) MCG/ACT inhaler Commonly known as: VENTOLIN HFA Inhale 2 puffs into the lungs every 6 (six) hours as needed for wheezing or shortness of breath.   aspirin 81 MG EC tablet Take 1 tablet (81 mg total) by mouth daily. Swallow whole.   carvedilol 3.125 MG tablet Commonly known as: COREG Take 1 tablet (3.125 mg total) by mouth 2 (two) times daily with a meal.   Combivent Respimat 20-100 MCG/ACT Aers respimat Generic drug: Ipratropium-Albuterol Inhale 1 puff into the lungs every 6 (six) hours as needed for wheezing or shortness of breath (As needed for wheezing , cough or shortness of breath).   donepezil 5 MG tablet Commonly known as: ARICEPT Take 1 tablet (5 mg total) by mouth at bedtime.   Eliquis 2.5 MG Tabs tablet Generic  drug: apixaban TAKE (1) TABLET TWICE A DAY.   febuxostat 40 MG tablet Commonly known as: ULORIC Take 1 tablet (40 mg total) by mouth daily.   fluticasone 50 MCG/ACT nasal spray Commonly known as: FLONASE Place 1 spray into both nostrils 2 (two) times daily as needed for allergies or rhinitis. SPRAY 1 SPRAY IN EACH NOSTRIL ONCE DAILY.   furosemide 20 MG tablet Commonly known as: LASIX TAKE (1) TABLET DAILY IN THE MORNING.   gabapentin 100 MG capsule Commonly known as: NEURONTIN Take 1 capsule (100 mg total) by mouth 2 (two) times daily.   levETIRAcetam 250 MG tablet Commonly known as: KEPPRA TAKE  ONE TABLET TWICE DAILY   losartan 25 MG tablet Commonly known as: COZAAR Take 0.5 tablets (12.5 mg total) by mouth daily.   Myrbetriq 25 MG Tb24 tablet Generic drug: mirabegron ER Take 1 tablet (25 mg total) by mouth daily.   pantoprazole 40 MG tablet Commonly known as: PROTONIX Take 1 tablet (40 mg total) by mouth daily.   sertraline 50 MG tablet Commonly known as: ZOLOFT Take 1 tablet (50 mg total) by mouth daily.   simvastatin 10 MG tablet Commonly known as: ZOCOR Take 1 tablet (10 mg total) by mouth daily at 6 PM.   tamsulosin 0.4 MG Caps capsule Commonly known as: FLOMAX TAKE 1 CAPSULE DAILY         Objective:   BP 130/67    Pulse (!) 55    SpO2 100%   Wt Readings from Last 3 Encounters:  02/20/21 160 lb 15 oz (73 kg)  02/03/21 160 lb (72.6 kg)  10/02/20 160 lb (72.6 kg)    Physical Exam Vitals and nursing note reviewed.  Constitutional:      General: He is not in acute distress.    Appearance: He is well-developed. He is not diaphoretic.  HENT:     Right Ear: There is impacted cerumen.     Left Ear: There is impacted cerumen.  Eyes:     General: No scleral icterus.    Conjunctiva/sclera: Conjunctivae normal.  Neck:     Thyroid: No thyromegaly.  Cardiovascular:     Rate and Rhythm: Normal rate and regular rhythm.     Heart sounds: Normal heart sounds. No murmur heard. Pulmonary:     Effort: Pulmonary effort is normal. No respiratory distress.     Breath sounds: Normal breath sounds. No wheezing.  Musculoskeletal:     Cervical back: Neck supple.  Lymphadenopathy:     Cervical: No cervical adenopathy.  Skin:    General: Skin is warm and dry.     Findings: No rash.  Neurological:     Mental Status: He is alert and oriented to person, place, and time.     Coordination: Coordination normal.  Psychiatric:        Behavior: Behavior normal.     Nurse to lavage cerumen, patient tolerated well.  Assessment & Plan:   Problem List Items Addressed  This Visit       Cardiovascular and Mediastinum   Essential hypertension     Nervous and Auditory   Alzheimer disease ([place])     Other   Hyperlipidemia (Chronic)   Other Visit Diagnoses     Need for shingles vaccine    -  Primary   Relevant Orders   Varicella-zoster vaccine IM (Shingrix) (Completed)   Bilateral impacted cerumen           No change in medication, follow-up in 3  months. Follow up plan: Return in about 3 months (around 08/09/2021), or if symptoms worsen or fail to improve, for Hypertension and cholesterol and Alzheimer's recheck.  Counseling provided for all of the vaccine components Orders Placed This Encounter  Procedures   Varicella-zoster vaccine IM (Shingrix)    Caryl Pina, MD [facility] 05/11/2021, 3:34 PM

## 2021-05-11 NOTE — Patient Instructions (Signed)
[place]: [phone]

## 2021-05-22 ENCOUNTER — Other Ambulatory Visit: Payer: Self-pay | Admitting: Family Medicine

## 2021-05-22 DIAGNOSIS — F0150 Vascular dementia without behavioral disturbance: Secondary | ICD-10-CM | POA: Diagnosis not present

## 2021-05-22 DIAGNOSIS — I5042 Chronic combined systolic (congestive) and diastolic (congestive) heart failure: Secondary | ICD-10-CM | POA: Diagnosis not present

## 2021-05-22 DIAGNOSIS — Z515 Encounter for palliative care: Secondary | ICD-10-CM | POA: Diagnosis not present

## 2021-06-06 ENCOUNTER — Other Ambulatory Visit: Payer: Self-pay | Admitting: Family Medicine

## 2021-06-18 ENCOUNTER — Other Ambulatory Visit: Payer: Self-pay | Admitting: Family Medicine

## 2021-06-24 DIAGNOSIS — Z515 Encounter for palliative care: Secondary | ICD-10-CM | POA: Diagnosis not present

## 2021-06-24 DIAGNOSIS — F0150 Vascular dementia without behavioral disturbance: Secondary | ICD-10-CM | POA: Diagnosis not present

## 2021-06-24 DIAGNOSIS — I5042 Chronic combined systolic (congestive) and diastolic (congestive) heart failure: Secondary | ICD-10-CM | POA: Diagnosis not present

## 2021-07-07 ENCOUNTER — Other Ambulatory Visit: Payer: Self-pay | Admitting: Family Medicine

## 2021-07-15 ENCOUNTER — Other Ambulatory Visit: Payer: Self-pay | Admitting: Family Medicine

## 2021-07-31 ENCOUNTER — Other Ambulatory Visit: Payer: Self-pay | Admitting: Family Medicine

## 2021-08-01 ENCOUNTER — Other Ambulatory Visit: Payer: Self-pay | Admitting: Family Medicine

## 2021-08-04 ENCOUNTER — Encounter: Payer: Self-pay | Admitting: Family Medicine

## 2021-08-04 ENCOUNTER — Ambulatory Visit (INDEPENDENT_AMBULATORY_CARE_PROVIDER_SITE_OTHER): Payer: Medicare Other | Admitting: Family Medicine

## 2021-08-04 VITALS — BP 111/75 | HR 82 | Temp 98.4°F | Ht 66.0 in

## 2021-08-04 DIAGNOSIS — N390 Urinary tract infection, site not specified: Secondary | ICD-10-CM

## 2021-08-04 NOTE — Progress Notes (Signed)
?  ? ?Subjective:  ?Patient ID: Scott [name], male    DOB: March 01, 1922, 88 y.o.   MRN: [id number] ? ?Patient Care Team: ?Dettinger, Fransisca Kaufmann, MD as PCP - General (Family Medicine) ?Minus Breeding, MD as PCP - Cardiology (Cardiology) ?Thompson Grayer, MD as Consulting Physician (Cardiology) ?Rosetta Posner, MD as Consulting Physician (Vascular Surgery) ?Minus Breeding, MD as Consulting Physician (Cardiology) ?Josue Hector, MD as Consulting Physician (Cardiology) ?Lavera Guise, [place] as [facility] (Pharmacist)  ? ?Chief Complaint:  Urinary Tract Infection ? ? ?HPI: ?Scott [name] is a 88 y.o. male presenting on 08/04/2021 for Urinary Tract Infection ? ? ?Pt presents today for evaluation of possible UTI per family. Family states some of the caregivers are concerned pt may have another UTI as he slept all day on Sunday. Pt denies any urinary symptoms. No fever, chills, flank or abdominal pain, decreased appetite, or increased confusion.  ? ?Urinary Tract Infection  ?The patient is experiencing no pain. There has been no fever. He is Not sexually active. Pertinent negatives include no chills, discharge, flank pain, frequency, hematuria, hesitancy, nausea, sweats, urgency or vomiting. He has tried nothing for the symptoms. His past medical history is significant for recurrent UTIs.  ? ? ?Relevant past medical, surgical, family, and social history reviewed and updated as indicated.  ?Allergies and medications reviewed and updated. Data reviewed: Chart in Epic. ? ? ?Past Medical History:  ?Diagnosis Date  ? Arthritis   ? "all over"  ? Atrial fibrillation ([place])   ? Benign localized prostatic hyperplasia with lower urinary tract symptoms (LUTS)   ? Bradycardia 11/28/2015  ? Severe-HR in the 20s to 53s following intubation for urological procedure.  ? CAD- non obstructive disease by cath 3/14 cardiologist-  dr hochrein  ? Chronic lower back pain   ? COPD (chronic obstructive pulmonary disease)  ([place])   ? DDD (degenerative disc disease)   ? Depression   ? Dyspnea   ? w/ exertion and lying down (raise head)  ? ETOH abuse   ? GERD (gastroesophageal reflux disease)   ? Glaucoma   ? Glaucoma, both eyes   ? History of bladder cancer urologist-- dr Jeffie Pollock  ? first dx 2012--  s/p TURBT's  ? History of DVT of lower extremity 03/2013  ? bilateral   ? History of pulmonary embolus (PE) 04/2013  ? HTN (hypertension)   ? Hyperlipidemia   ? Hypertension   ? LBBB (left bundle branch block)   ? chronic  ? Non-ischemic cardiomyopathy ([place])   ? last echo 01-18-2017, ef 35-40%  ? NSVT (nonsustained ventricular tachycardia) ([place])   ? a. NSVT 06/2012; NSVT also seen during 03/2013 adm. b. Med rx. Not candidate for ICD given adv age.  ? PAD (peripheral artery disease) ([place])   ? Popliteal artery aneurysm, bilateral ([facility])   ? DOCUMENTED CHRONIC PARTIAL OCCLUSION--  PT DENIES CLAUDICATION OR ANY OTHER SYMPTOMS  ? PVCs (premature ventricular contractions)   ? PVD (peripheral vascular disease) ([place])   ? Right ABI .75, Left .78 (2006)  ? Syncope 06/2012  ? a. Felt to be postural syncope related to diuretics 06/2012.  ? Systolic and diastolic CHF, chronic ([facility]) cardiologsit-  dr hochrein  ? a. NICM - patent cors 06/2012, EF 40% at that time. b. 03/2013 eval: EF 20-25%.  ? Vision loss, left eye   ? due to glaucoma  ? Wears glasses   ? ? ?Past Surgical History:  ?Procedure Laterality  Date  ? AMPUTATION Left 10/16/2014  ? Procedure: AMPUTATION ABOVE KNEE- LEFT;  Surgeon: Mal Misty, MD;  Location: [facility];  Service: Vascular;  Laterality: Left;  ? CARDIAC CATHETERIZATION  05-11-2004  DR [place] PLAQUE/ NORMAL LVF/ EF 55%/  NON-OBSTRUCTIVE LAD 25%  ? CARDIAC CATHETERIZATION  06/18/2012   dr hochrein  ? mild luminal irregularities of coronaries, ef 45-50%  ? CARDIOVASCULAR STRESS TEST  10-15-2010  ? LOW RISK NUCLEAR STUDY/ NO EVIDENCE OF ISCHEMIA/ NORMAL EF  ? CATARACT EXTRACTION W/ INTRAOCULAR LENS  IMPLANT, BILATERAL  Bilateral   ? CYSTOSCOPY  10/07/2011  ? Procedure: CYSTOSCOPY;  Surgeon: Malka So, MD;  Location[facility];  Service: Urology;  Laterality: N/A;  ? CYSTOSCOPY N/A 10/20/2018  ? Procedure: CYSTOSCOPY WITH FULGURATION OF PROSTATIC URETHRAL TUMOR;  Surgeon: [name], MD;  Location: [facility];  Service: Urology;  Laterality: N/A;  ? CYSTOSCOPY N/A 10/11/2019  ? Procedure: CYSTOSCOPY;  Surgeon: Cleon Gustin, MD;  Location: [facility];  Service: Urology;  Laterality: N/A;  ? CYSTOSCOPY W/ RETROGRADES Bilateral 03/04/2020  ? Procedure: CYSTOSCOPY WITH BILATERAL RETROGRADE;  Surgeon: [name], MD;  Location: [facility];  Service: Urology;  Laterality: Bilateral;  ? CYSTOSCOPY WITH BIOPSY  03/14/2012  ? Procedure: CYSTOSCOPY WITH BIOPSY;  Surgeon: Malka So, MD;  Location: [facility];  Service: Urology;  Laterality: N/A;  WITH FULGURATION  ? CYSTOSCOPY WITH BIOPSY N/A 12/09/2016  ? Procedure: CYSTOSCOPY WITH BIOPSY AND FULGURATION;  Surgeon: [name], MD;  Location: [facility];  Service: Urology;  Laterality: N/A;  ? CYSTOSCOPY WITH BIOPSY N/A 12/20/2017  ? Procedure: CYSTOSCOPY WITH BIOPSY WITH FULGURATION;  Surgeon: [name], MD;  Location[facility];  Service: Urology;  Laterality: N/A;  ? CYSTOSCOPY WITH BIOPSY N/A 06/09/2018  ? Procedure: CYSTOSCOPY WITH BIOPSY AND FULGURATION;  Surgeon: [name], MD;  Location[facility];  Service: Urology;  Laterality: N/A;  ? CYSTOSCOPY WITH FULGERATION N/A 01/20/2016  ? Procedure: CYSTOSCOPY, BIOPSY WITH FULGERATION OF URTHRAL TUMOR;  Surgeon: [name], MD;  Location[facility];  Service: Urology;  Laterality: N/A;  ? CYSTOSCOPY WITH FULGERATION N/A 06/01/2016  ? Procedure: CYSTOSCOPY WITH FULGERATION urethral tumors and bladder neck;  Surgeon: [name], MD;  Location: [facility];  Service: Urology;  Laterality: N/A;  ? SHOULDER SURGERY Left 1970's  ? TONSILLECTOMY    ? TRANSTHORACIC ECHOCARDIOGRAM  01-18-2017   dr hochrein  ? moderate LVH, ef 35-40%, diffuse hypokinesis/   trivial AR and MR/  mild TR  ? TRANSURETHRAL RESECTION OF BLADDER TUMOR  10/07/2011  ? Procedure: TRANSURETHRAL RESECTION OF BLADDER TUMOR (TURBT);  Surgeon: Malka So, MD;  [facility];  Service: Urology;  Laterality: N/A;  ? TRANSURETHRAL RESECTION OF BLADDER TUMOR N/A 07/10/2015  ? Procedure: TRANSURETHRAL RESECTION OF BLADDER TUMOR (TURBT), CYSTOSCOPY ;  Surgeon: [name], MD;  [facility];  Service: Urology;  Laterality: N/A;  ? TRANSURETHRAL RESECTION OF BLADDER TUMOR N/A 10/11/2019  ? Procedure: TRANSURETHRAL RESECTION OF PROSTATIC URETHRAL TUMOR;  Surgeon: Cleon Gustin, MD;  Location[facility];  Service: Urology;  Laterality: N/A;  ? TRANSURETHRAL RESECTION OF BLADDER TUMOR WITH MITOMYCIN-C N/A 03/04/2020  ? Procedure: TRANSURETHRAL RESECTION OF BLADDER TUMOR WITH GEMCITABINE;  Surgeon: [name], MD;  Location[facility];  Service: Urology;  Laterality: N/A;  ? TRANSURETHRAL RESECTION OF PROSTATE  03/14/2012  ? Procedure: TRANSURETHRAL RESECTION OF THE PROSTATE WITH GYRUS INSTRUMENTS;  Surgeon:  Malka So, MD;  Location[facility];  Service: Urology;  Laterality: N/A;  ? ? ?Social History  ? ?Socioeconomic History  ? Marital status: Widowed  ?  Spouse name: Not on file  ? Number of children: 1  ? Years of education: Not on file  ? Highest education level: Not on file  ?Occupational History  ? Occupation: Retired  ?Tobacco Use  ? Smoking status: Former  ?  Packs/day: 1.00  ?  Years: 35.00  ?  Pack years: 35.00  ?  Types: Cigarettes  ?  Start date: 04/12/1942  ?  Quit date: 04/13/1975  ?  Years since quitting: 46.3  ? Smokeless tobacco: Never  ?Vaping Use  ? Vaping Use: Never used  ?Substance and Sexual Activity  ? Alcohol use: Not Currently  ?  Alcohol/week: 7.0 standard drinks  ?  Types: 7 Cans of beer per week  ? Drug use: No  ? Sexual activity: Never  ?Other Topics Concern  ? Not on file  ?Social History Narrative  ? He lives alone, but has 24 hour care from caregivers and  family  ? He has one grown son who lives in [place] and stops by daily - helps with bills, getting meds, etc  ? He is retired but worked for [employer] for years and lived in [place].  ? ?Social Determinan

## 2021-08-05 ENCOUNTER — Other Ambulatory Visit: Payer: Self-pay | Admitting: Family Medicine

## 2021-08-05 ENCOUNTER — Other Ambulatory Visit: Payer: Medicare Other

## 2021-08-05 DIAGNOSIS — I5042 Chronic combined systolic (congestive) and diastolic (congestive) heart failure: Secondary | ICD-10-CM | POA: Diagnosis not present

## 2021-08-05 DIAGNOSIS — N390 Urinary tract infection, site not specified: Secondary | ICD-10-CM | POA: Diagnosis not present

## 2021-08-05 DIAGNOSIS — F0150 Vascular dementia without behavioral disturbance: Secondary | ICD-10-CM | POA: Diagnosis not present

## 2021-08-05 LAB — URINALYSIS, ROUTINE W REFLEX MICROSCOPIC
Bilirubin, UA: NEGATIVE
Glucose, UA: NEGATIVE
Ketones, UA: NEGATIVE
Leukocytes,UA: NEGATIVE
Nitrite, UA: NEGATIVE
Specific Gravity, UA: 1.02 (ref 1.005–1.030)
Urobilinogen, Ur: 0.2 mg/dL (ref 0.2–1.0)
pH, UA: 6 (ref 5.0–7.5)

## 2021-08-05 LAB — MICROSCOPIC EXAMINATION: RBC, Urine: >30 /hpf — AB (ref 0–2)

## 2021-08-07 LAB — URINE CULTURE

## 2021-08-10 ENCOUNTER — Other Ambulatory Visit: Payer: Self-pay | Admitting: Family Medicine

## 2021-08-11 ENCOUNTER — Encounter: Payer: Self-pay | Admitting: Family Medicine

## 2021-08-11 ENCOUNTER — Telehealth: Payer: Self-pay | Admitting: Family Medicine

## 2021-08-11 ENCOUNTER — Other Ambulatory Visit: Payer: Self-pay

## 2021-08-11 MED ORDER — SIMVASTATIN 10 MG PO TABS: 10.0000 mg | ORAL_TABLET | Freq: Every day | ORAL | 0 refills | Status: DC

## 2021-08-11 NOTE — Telephone Encounter (Signed)
LMOVM pt's cholesterol medication was sent to pharmacy ?Done in another encounter ?

## 2021-08-11 NOTE — Telephone Encounter (Signed)
Letter sent.

## 2021-08-11 NOTE — Telephone Encounter (Signed)
?  Prescription Request ? ?08/11/2021 ? ?Is this a "Controlled Substance" medicine? no ? ?Have you seen your PCP in the last 2 weeks? no ? ?If YES, route message to pool  -  If NO, patient needs to be scheduled for appointment. ? ?What is the name of the medication or equipment? Symvastatin ? ?Have you contacted your pharmacy to request a refill? yes ? ?Which pharmacy would you like this sent to? [facility] ? ? ?Patient notified that their request is being sent to the clinical staff for review and that they should receive a response within 2 business days.  ?  ?Dettinger's pt. ? ?Appt is made for June 2023. ?

## 2021-08-11 NOTE — Telephone Encounter (Signed)
Dettinger. NTBS 30 days given 08/05/21 ?

## 2021-08-11 NOTE — Telephone Encounter (Signed)
CNA, Suanne Marker made aware of UA and culture results. She is with pt daily until 11:30am. ? ?Suanne Marker c/o pt being up during the night, rolling around in wheelchair getting urine in the floor, beign combative and verbally abusive. ? ?Advised that sertraline was called in by Dettinger yesterday. She was not aware of this. Advised that the medication could help with those symptoms. ? ?Did not see a note on why Dettinger started the medication. Suanne Marker states that he does need something to help him calm down but would like ot clarify with Dr. Warrick Parisian if ok to start taking the Sertraline (there is not a note in chart, labs, pt encounters, imaging on why Dettinger sent the medication in). Will get advise tomorrow when he returns to the office. Suanne Marker made aware of this. She would like a call back before 11:30 if possible at [phone]. ?

## 2021-08-12 DIAGNOSIS — Z20822 Contact with and (suspected) exposure to covid-19: Secondary | ICD-10-CM | POA: Diagnosis not present

## 2021-08-12 NOTE — Telephone Encounter (Signed)
Left message for Rhonda to return call.

## 2021-08-12 NOTE — Telephone Encounter (Signed)
Sertraline is not new, it looks like he has been on it or at least has been prescribed it for quite a few years, he was also prescribed it most recently before this for a year in April 2022, if he has not been taking it then this should help with those symptoms but if he has already been taking it then may be we need to adjust the dose.  I guess we need to find out first if he is taking it currently or not because I have that it has been on his medication list for quite a few years and this was likely sent in as a refill ?

## 2021-08-13 NOTE — Telephone Encounter (Signed)
Scott [name] returned call. Reviewed Dr Neldon Mc notes with her. Suanne Marker went back to look at all of pts medications and confirmed that the Sertaline is in his pill pack so he has been taking it.  ? ?Please advise and call Scott [name] with update. ?Leave her a message if she is unable to answer the phone.  ?

## 2021-08-13 NOTE — Telephone Encounter (Signed)
Increase the Zoloft to 100 mg tablets and send him a new 90-day prescription for that and see if that helps. ?

## 2021-08-13 NOTE — Telephone Encounter (Signed)
Tried calling CNA. No answer, no vmail ?

## 2021-08-14 ENCOUNTER — Other Ambulatory Visit: Payer: Self-pay | Admitting: Family Medicine

## 2021-08-27 ENCOUNTER — Other Ambulatory Visit: Payer: Self-pay | Admitting: Family Medicine

## 2021-09-03 ENCOUNTER — Telehealth: Payer: Self-pay | Admitting: Family Medicine

## 2021-09-03 DIAGNOSIS — I5042 Chronic combined systolic (congestive) and diastolic (congestive) heart failure: Secondary | ICD-10-CM | POA: Diagnosis not present

## 2021-09-03 DIAGNOSIS — F01518 Vascular dementia, unspecified severity, with other behavioral disturbance: Secondary | ICD-10-CM | POA: Diagnosis not present

## 2021-09-03 NOTE — Telephone Encounter (Signed)
Scott [name] called from [place] stating that she spoke with pts caregiver and son about pt and was told that pt has been a lot more agitated and restless.  They want to know if PCP wants to increase Sertraline and add Seroquel or just add Seroquel?

## 2021-09-04 MED ORDER — QUETIAPINE FUMARATE 50 MG PO TABS: 50.0000 mg | ORAL_TABLET | Freq: Every day | ORAL | 2 refills | Status: DC

## 2021-09-04 NOTE — Telephone Encounter (Signed)
Pt aware.

## 2021-09-04 NOTE — Telephone Encounter (Signed)
Sent seroquel for the patient

## 2021-09-15 ENCOUNTER — Other Ambulatory Visit: Payer: Self-pay | Admitting: Family Medicine

## 2021-09-16 ENCOUNTER — Encounter: Payer: Self-pay | Admitting: Family Medicine

## 2021-09-16 ENCOUNTER — Ambulatory Visit (INDEPENDENT_AMBULATORY_CARE_PROVIDER_SITE_OTHER): Payer: Medicare Other | Admitting: Family Medicine

## 2021-09-16 VITALS — BP 117/53 | HR 50 | Temp 97.2°F | Ht 66.0 in

## 2021-09-16 DIAGNOSIS — F02818 Dementia in other diseases classified elsewhere, unspecified severity, with other behavioral disturbance: Secondary | ICD-10-CM | POA: Diagnosis not present

## 2021-09-16 DIAGNOSIS — I10 Essential (primary) hypertension: Secondary | ICD-10-CM | POA: Diagnosis not present

## 2021-09-16 DIAGNOSIS — G309 Alzheimer's disease, unspecified: Secondary | ICD-10-CM

## 2021-09-16 DIAGNOSIS — I480 Paroxysmal atrial fibrillation: Secondary | ICD-10-CM | POA: Diagnosis not present

## 2021-09-16 DIAGNOSIS — I5022 Chronic systolic (congestive) heart failure: Secondary | ICD-10-CM | POA: Diagnosis not present

## 2021-09-16 MED ORDER — ARIPIPRAZOLE 5 MG PO TABS
5.0000 mg | ORAL_TABLET | Freq: Every day | ORAL | 1 refills | Status: AC
Start: 2021-09-16 — End: ?

## 2021-09-16 MED ORDER — SERTRALINE HCL 100 MG PO TABS: 100.0000 mg | ORAL_TABLET | Freq: Every day | ORAL | 3 refills | Status: AC

## 2021-09-16 MED ORDER — SIMVASTATIN 10 MG PO TABS: 10.0000 mg | ORAL_TABLET | Freq: Every day | ORAL | 3 refills | Status: AC

## 2021-09-16 MED ORDER — DONEPEZIL HCL 5 MG PO TABS: 5.0000 mg | ORAL_TABLET | Freq: Every day | ORAL | 1 refills | Status: AC

## 2021-09-16 MED ORDER — APIXABAN 2.5 MG PO TABS: 2.5000 mg | ORAL_TABLET | Freq: Two times a day (BID) | ORAL | 5 refills | Status: AC

## 2021-09-16 MED ORDER — TAMSULOSIN HCL 0.4 MG PO CAPS: 0.4000 mg | ORAL_CAPSULE | Freq: Every day | ORAL | 3 refills | Status: AC

## 2021-09-16 MED ORDER — FUROSEMIDE 20 MG PO TABS: 20.0000 mg | ORAL_TABLET | Freq: Every day | ORAL | 3 refills | Status: AC

## 2021-09-16 MED ORDER — LOSARTAN POTASSIUM 25 MG PO TABS: 12.5000 mg | ORAL_TABLET | Freq: Every day | ORAL | 3 refills | Status: AC

## 2021-09-16 NOTE — Progress Notes (Signed)
BP (!) 117/53   Pulse (!) 50   Temp (!) 97.2 F (36.2 C)   Ht '5\' 6"'$  (1.676 m)   SpO2 97%   BMI 25.98 kg/m    Subjective:   Patient ID: Scott [name], male    DOB: June 29, 1921, 88 y.o.   MRN: [id number]  HPI: Scott [name] is a 88 y.o. male presenting on 09/16/2021 for Medical Management of Chronic Issues and Pain (Right heel/phantom LLE pain)   HPI Irritability and anxiety with memory issues Patient is having more irritability and anxiety with memory issues.  We tried the Seroquel but it made him too sedated so they would like to try something different for the family to help calm down.  His memory is worsening and he is becoming more sedentary and sleeping all of the time.  They have noticed some recent changes over the past few weeks and that and that he has a more flat affect.  CHF and hypertension recheck and A-fib with history of DVTs Patient is currently on Eliquis and furosemide and losartan and carvedilol, and their blood pressure today is 117/53. Patient denies any lightheadedness or dizziness. Patient denies headaches, blurred vision, chest pains, shortness of breath, or weakness. Denies any side effects from medication and is content with current medication.   Relevant past medical, surgical, family and social history reviewed and updated as indicated. Interim medical history since our last visit reviewed. Allergies and medications reviewed and updated.  Review of Systems  Constitutional:  Negative for chills and fever.  Eyes:  Negative for discharge.  Respiratory:  Negative for shortness of breath and wheezing.   Cardiovascular:  Negative for chest pain and leg swelling.  Musculoskeletal:  Negative for back pain and gait problem.  Skin:  Negative for rash.  Psychiatric/Behavioral:  Positive for decreased concentration, dysphoric mood and sleep disturbance. Negative for self-injury and suicidal ideas. The patient is nervous/anxious.   All other systems reviewed and are  negative.  Per HPI unless specifically indicated above   Allergies as of 09/16/2021       Reactions   Atorvastatin Hives, Rash   Rocephin [ceftriaxone Sodium In Dextrose] Other (See Comments)   Recurrent seizures        Medication List        Accurate as of September 16, 2021  1:32 PM. If you have any questions, ask your nurse or doctor.          STOP taking these medications    QUEtiapine 50 MG tablet Commonly known as: SEROquel Stopped by: Worthy Rancher, MD       TAKE these medications    acetaminophen 500 MG tablet Commonly known as: TYLENOL Take 1,000 mg by mouth every 6 (six) hours as needed for moderate pain or headache.   albuterol (2.5 MG/3ML) 0.083% nebulizer solution Commonly known as: PROVENTIL Take 3 mLs (2.5 mg total) by nebulization every 6 (six) hours as needed for wheezing or shortness of breath.   albuterol 108 (90 Base) MCG/ACT inhaler Commonly known as: VENTOLIN HFA Inhale 2 puffs into the lungs every 6 (six) hours as needed for wheezing or shortness of breath.   apixaban 2.5 MG Tabs tablet Commonly known as: Eliquis Take 1 tablet (2.5 mg total) by mouth 2 (two) times daily. What changed: how much to take Changed by: Worthy Rancher, MD   ARIPiprazole 5 MG tablet Commonly known as: Abilify Take 1 tablet (5 mg total) by mouth daily. Keep on file  until patient requests Started by: Worthy Rancher, MD   aspirin EC 81 MG tablet Take 1 tablet (81 mg total) by mouth daily. Swallow whole.   carvedilol 3.125 MG tablet Commonly known as: COREG Take 1 tablet (3.125 mg total) by mouth 2 (two) times daily with a meal.   Combivent Respimat 20-100 MCG/ACT Aers respimat Generic drug: Ipratropium-Albuterol Inhale 1 puff into the lungs every 6 (six) hours as needed for wheezing or shortness of breath (As needed for wheezing , cough or shortness of breath).   donepezil 5 MG tablet Commonly known as: ARICEPT Take 1 tablet (5 mg total) by  mouth at bedtime.   febuxostat 40 MG tablet Commonly known as: ULORIC TAKE 1 TABLET DAILY   fluticasone 50 MCG/ACT nasal spray Commonly known as: FLONASE Place 1 spray into both nostrils 2 (two) times daily as needed for allergies or rhinitis. SPRAY 1 SPRAY IN EACH NOSTRIL ONCE DAILY.   furosemide 20 MG tablet Commonly known as: LASIX Take 1 tablet (20 mg total) by mouth daily. What changed: See the new instructions. Changed by: Fransisca Kaufmann Ghassan Coggeshall, MD   gabapentin 100 MG capsule Commonly known as: NEURONTIN Take 1 capsule (100 mg total) by mouth 2 (two) times daily.   levETIRAcetam 250 MG tablet Commonly known as: KEPPRA TAKE ONE TABLET TWICE DAILY   losartan 25 MG tablet Commonly known as: COZAAR Take 0.5 tablets (12.5 mg total) by mouth daily.   Myrbetriq 25 MG Tb24 tablet Generic drug: mirabegron ER Take 1 tablet (25 mg total) by mouth daily.   pantoprazole 40 MG tablet Commonly known as: PROTONIX Take 1 tablet (40 mg total) by mouth daily.   sertraline 100 MG tablet Commonly known as: ZOLOFT Take 1 tablet (100 mg total) by mouth daily. What changed:  medication strength how much to take Changed by: Fransisca Kaufmann Aayush Gelpi, MD   simvastatin 10 MG tablet Commonly known as: ZOCOR Take 1 tablet (10 mg total) by mouth daily at 6 PM. What changed: additional instructions Changed by: Fransisca Kaufmann Maeli Spacek, MD   tamsulosin 0.4 MG Caps capsule Commonly known as: FLOMAX Take 1 capsule (0.4 mg total) by mouth daily.         Objective:   BP (!) 117/53   Pulse (!) 50   Temp (!) 97.2 F (36.2 C)   Ht '5\' 6"'$  (1.676 m)   SpO2 97%   BMI 25.98 kg/m   Wt Readings from Last 3 Encounters:  02/20/21 160 lb 15 oz (73 kg)  02/03/21 160 lb (72.6 kg)  10/02/20 160 lb (72.6 kg)    Physical Exam Vitals and nursing note reviewed.  Constitutional:      General: He is not in acute distress.    Appearance: He is well-developed. He is not diaphoretic.  Eyes:     General: No  scleral icterus.    Conjunctiva/sclera: Conjunctivae normal.  Neck:     Thyroid: No thyromegaly.  Cardiovascular:     Rate and Rhythm: Normal rate and regular rhythm.     Heart sounds: Normal heart sounds. No murmur heard. Pulmonary:     Effort: Pulmonary effort is normal. No respiratory distress.     Breath sounds: Examination of the right-lower field reveals rales. Examination of the left-lower field reveals rales. Rales present. No wheezing.  Musculoskeletal:        General: Normal range of motion.     Cervical back: Neck supple.  Lymphadenopathy:     Cervical: No cervical adenopathy.  Skin:    General: Skin is warm and dry.     Findings: No rash.  Neurological:     Mental Status: He is alert and oriented to person, place, and time.     Coordination: Coordination normal.  Psychiatric:        Attention and Perception: He is inattentive.        Mood and Affect: Mood is anxious and depressed.        Behavior: Behavior normal.        Thought Content: Thought content does not include suicidal ideation. Thought content does not include suicidal plan.        Cognition and Memory: Memory is impaired. He exhibits impaired recent memory.      Assessment & Plan:   Problem List Items Addressed This Visit       Cardiovascular and Mediastinum   Chronic systolic HF (heart failure) (HCC)   Relevant Medications   simvastatin (ZOCOR) 10 MG tablet   losartan (COZAAR) 25 MG tablet   furosemide (LASIX) 20 MG tablet   apixaban (ELIQUIS) 2.5 MG TABS tablet   Essential hypertension   Relevant Medications   simvastatin (ZOCOR) 10 MG tablet   losartan (COZAAR) 25 MG tablet   furosemide (LASIX) 20 MG tablet   apixaban (ELIQUIS) 2.5 MG TABS tablet   Paroxysmal atrial fibrillation (HCC) - Primary   Relevant Medications   simvastatin (ZOCOR) 10 MG tablet   losartan (COZAAR) 25 MG tablet   furosemide (LASIX) 20 MG tablet   apixaban (ELIQUIS) 2.5 MG TABS tablet     Nervous and Auditory    Alzheimer disease (HCC)   Relevant Medications   sertraline (ZOLOFT) 100 MG tablet   donepezil (ARICEPT) 5 MG tablet   ARIPiprazole (ABILIFY) 5 MG tablet    Will increase Zoloft to 100 mg and try that first for 2 weeks.  If that does not improve things then they can try Abilify.  No changes in treatment or medications other than that.  They are concerned that he may have had a possible CVA because of flat affect and changes in his face.  It is a possibility but he is already on anticoagulation and would not necessarily change that so no reason for MRI at this point.  Baseline memory slightly worsened.  We will monitor closely in the future.  Have discussed hospice with patient's family before and they have declined. Follow up plan: Return in about 6 months (around 03/18/2022), or if symptoms worsen or fail to improve, for Hypertension and A-fib and CHF.  Counseling provided for all of the vaccine components No orders of the defined types were placed in this encounter.   Caryl Pina, MD [facility] 09/16/2021, 1:32 PM

## 2021-09-23 ENCOUNTER — Other Ambulatory Visit: Payer: Self-pay | Admitting: Family Medicine

## 2021-09-28 ENCOUNTER — Other Ambulatory Visit: Payer: Self-pay | Admitting: Family Medicine

## 2021-10-01 DIAGNOSIS — I5042 Chronic combined systolic (congestive) and diastolic (congestive) heart failure: Secondary | ICD-10-CM | POA: Diagnosis not present

## 2021-10-01 DIAGNOSIS — G301 Alzheimer's disease with late onset: Secondary | ICD-10-CM | POA: Diagnosis not present

## 2021-10-01 DIAGNOSIS — F01518 Vascular dementia, unspecified severity, with other behavioral disturbance: Secondary | ICD-10-CM | POA: Diagnosis not present

## 2021-10-01 DIAGNOSIS — J9611 Chronic respiratory failure with hypoxia: Secondary | ICD-10-CM | POA: Diagnosis not present

## 2021-10-03 ENCOUNTER — Other Ambulatory Visit: Payer: Self-pay | Admitting: Family Medicine

## 2021-10-08 ENCOUNTER — Telehealth: Payer: Self-pay

## 2021-10-08 MED ORDER — CLONAZEPAM 0.25 MG PO TBDP: 0.2500 mg | ORAL_TABLET | Freq: Two times a day (BID) | ORAL | 1 refills | Status: AC | PRN

## 2021-10-08 NOTE — Telephone Encounter (Signed)
I sent some clonazepam for him but as far as the Seroquel, I believe we already took him off the Seroquel and started him on Abilify 5 mg because of the sedation and that should help more with agitation as well.  He should have the Abilify 5 mg instead of the Seroquel.  I would be cautious about the clonazepam and how often you use it because it can also cause sedation.

## 2021-10-08 NOTE — Telephone Encounter (Signed)
Christine with [facility] palliative care to inform that pt is having excessive sleepiness in the morning. Believes it is from the Seroquel '50mg'$  pt is taking at night. Pt is not eating and drinking as much because of this.  Wants to know if Seroquel can be decreased? States that they can manage if Dr. Warrick Parisian is ok to do that.  Also, when the Seroquel does wear off pt is still agitated and hard to manage. One of the NP's is suggesting Clonazepam .'25mg'$  q 4-6 hours prn.  Can call Altha Harm back at [phone].

## 2021-10-08 NOTE — Telephone Encounter (Signed)
Malisa informed with Palliative care of [place]'s recommendations and will make family of pt aware.

## 2021-10-12 DIAGNOSIS — J449 Chronic obstructive pulmonary disease, unspecified: Secondary | ICD-10-CM | POA: Diagnosis not present

## 2021-10-12 DIAGNOSIS — G309 Alzheimer's disease, unspecified: Secondary | ICD-10-CM | POA: Diagnosis not present

## 2021-10-12 DIAGNOSIS — G311 Senile degeneration of brain, not elsewhere classified: Secondary | ICD-10-CM | POA: Diagnosis not present

## 2021-10-12 DIAGNOSIS — F32A Depression, unspecified: Secondary | ICD-10-CM | POA: Diagnosis not present

## 2021-10-12 DIAGNOSIS — I10 Essential (primary) hypertension: Secondary | ICD-10-CM | POA: Diagnosis not present

## 2021-10-12 DIAGNOSIS — N400 Enlarged prostate without lower urinary tract symptoms: Secondary | ICD-10-CM | POA: Diagnosis not present

## 2021-10-12 DIAGNOSIS — I5040 Unspecified combined systolic (congestive) and diastolic (congestive) heart failure: Secondary | ICD-10-CM | POA: Diagnosis not present

## 2021-10-12 DIAGNOSIS — N183 Chronic kidney disease, stage 3 unspecified: Secondary | ICD-10-CM | POA: Diagnosis not present

## 2021-10-12 DIAGNOSIS — I519 Heart disease, unspecified: Secondary | ICD-10-CM | POA: Diagnosis not present

## 2021-10-12 DIAGNOSIS — K219 Gastro-esophageal reflux disease without esophagitis: Secondary | ICD-10-CM | POA: Diagnosis not present

## 2021-10-12 DIAGNOSIS — E785 Hyperlipidemia, unspecified: Secondary | ICD-10-CM | POA: Diagnosis not present

## 2021-10-12 DIAGNOSIS — H2513 Age-related nuclear cataract, bilateral: Secondary | ICD-10-CM | POA: Diagnosis not present

## 2021-10-12 DIAGNOSIS — I639 Cerebral infarction, unspecified: Secondary | ICD-10-CM | POA: Diagnosis not present

## 2021-10-14 DIAGNOSIS — I5040 Unspecified combined systolic (congestive) and diastolic (congestive) heart failure: Secondary | ICD-10-CM | POA: Diagnosis not present

## 2021-10-14 DIAGNOSIS — G311 Senile degeneration of brain, not elsewhere classified: Secondary | ICD-10-CM | POA: Diagnosis not present

## 2021-10-14 DIAGNOSIS — H2513 Age-related nuclear cataract, bilateral: Secondary | ICD-10-CM | POA: Diagnosis not present

## 2021-10-14 DIAGNOSIS — I639 Cerebral infarction, unspecified: Secondary | ICD-10-CM | POA: Diagnosis not present

## 2021-10-14 DIAGNOSIS — G309 Alzheimer's disease, unspecified: Secondary | ICD-10-CM | POA: Diagnosis not present

## 2021-10-14 DIAGNOSIS — N400 Enlarged prostate without lower urinary tract symptoms: Secondary | ICD-10-CM | POA: Diagnosis not present

## 2021-10-16 DIAGNOSIS — N400 Enlarged prostate without lower urinary tract symptoms: Secondary | ICD-10-CM | POA: Diagnosis not present

## 2021-10-16 DIAGNOSIS — G309 Alzheimer's disease, unspecified: Secondary | ICD-10-CM | POA: Diagnosis not present

## 2021-10-16 DIAGNOSIS — I639 Cerebral infarction, unspecified: Secondary | ICD-10-CM | POA: Diagnosis not present

## 2021-10-16 DIAGNOSIS — H2513 Age-related nuclear cataract, bilateral: Secondary | ICD-10-CM | POA: Diagnosis not present

## 2021-10-16 DIAGNOSIS — G311 Senile degeneration of brain, not elsewhere classified: Secondary | ICD-10-CM | POA: Diagnosis not present

## 2021-10-16 DIAGNOSIS — I5040 Unspecified combined systolic (congestive) and diastolic (congestive) heart failure: Secondary | ICD-10-CM | POA: Diagnosis not present

## 2021-10-17 ENCOUNTER — Other Ambulatory Visit: Payer: Self-pay | Admitting: Family Medicine

## 2021-10-17 DIAGNOSIS — I480 Paroxysmal atrial fibrillation: Secondary | ICD-10-CM

## 2021-10-19 ENCOUNTER — Other Ambulatory Visit: Payer: Self-pay | Admitting: Family Medicine

## 2021-11-10 DEATH — deceased

## 2022-05-20 NOTE — Telephone Encounter (Signed)
Encounter close
